# Patient Record
Sex: Female | Born: 1970
Health system: Southern US, Community
[De-identification: ages and names within clinical notes are randomized; demographics above are authoritative.]

## PROBLEM LIST (undated history)

## (undated) DIAGNOSIS — R1110 Vomiting, unspecified: Secondary | ICD-10-CM

## (undated) DIAGNOSIS — H269 Unspecified cataract: Secondary | ICD-10-CM

## (undated) DIAGNOSIS — R011 Cardiac murmur, unspecified: Secondary | ICD-10-CM

## (undated) DIAGNOSIS — T7840XA Allergy, unspecified, initial encounter: Secondary | ICD-10-CM

## (undated) DIAGNOSIS — I509 Heart failure, unspecified: Secondary | ICD-10-CM

## (undated) DIAGNOSIS — F419 Anxiety disorder, unspecified: Secondary | ICD-10-CM

## (undated) DIAGNOSIS — E039 Hypothyroidism, unspecified: Secondary | ICD-10-CM

## (undated) DIAGNOSIS — R197 Diarrhea, unspecified: Secondary | ICD-10-CM

## (undated) DIAGNOSIS — F32A Depression, unspecified: Secondary | ICD-10-CM

## (undated) DIAGNOSIS — E7800 Pure hypercholesterolemia, unspecified: Secondary | ICD-10-CM

## (undated) DIAGNOSIS — R7989 Other specified abnormal findings of blood chemistry: Secondary | ICD-10-CM

## (undated) DIAGNOSIS — E1110 Type 2 diabetes mellitus with ketoacidosis without coma: Secondary | ICD-10-CM

## (undated) DIAGNOSIS — R0600 Dyspnea, unspecified: Secondary | ICD-10-CM

## (undated) DIAGNOSIS — D649 Anemia, unspecified: Secondary | ICD-10-CM

## (undated) DIAGNOSIS — K219 Gastro-esophageal reflux disease without esophagitis: Secondary | ICD-10-CM

## (undated) DIAGNOSIS — F329 Major depressive disorder, single episode, unspecified: Secondary | ICD-10-CM

## (undated) DIAGNOSIS — F1910 Other psychoactive substance abuse, uncomplicated: Secondary | ICD-10-CM

## (undated) DIAGNOSIS — I255 Ischemic cardiomyopathy: Secondary | ICD-10-CM

## (undated) DIAGNOSIS — I2510 Atherosclerotic heart disease of native coronary artery without angina pectoris: Secondary | ICD-10-CM

## (undated) DIAGNOSIS — J45909 Unspecified asthma, uncomplicated: Secondary | ICD-10-CM

## (undated) HISTORY — DX: Other specified abnormal findings of blood chemistry: R79.89

## (undated) HISTORY — PX: COLONOSCOPY: SHX174

## (undated) HISTORY — DX: Other psychoactive substance abuse, uncomplicated: F19.10

## (undated) HISTORY — DX: Ischemic cardiomyopathy: I25.5

## (undated) HISTORY — DX: Cardiac murmur, unspecified: R01.1

## (undated) HISTORY — DX: Allergy, unspecified, initial encounter: T78.40XA

## (undated) HISTORY — DX: Anxiety disorder, unspecified: F41.9

## (undated) HISTORY — DX: Anemia, unspecified: D64.9

## (undated) HISTORY — DX: Unspecified asthma, uncomplicated: J45.909

## (undated) HISTORY — DX: Unspecified cataract: H26.9

## (undated) HISTORY — DX: Heart failure, unspecified: I50.9

## (undated) HISTORY — PX: APPENDECTOMY: SHX54

---

## 1998-01-16 ENCOUNTER — Emergency Department (HOSPITAL_COMMUNITY): Admission: EM | Admit: 1998-01-16 | Discharge: 1998-01-16 | Payer: Self-pay | Admitting: Emergency Medicine

## 2010-12-04 ENCOUNTER — Encounter: Payer: Self-pay | Admitting: *Deleted

## 2010-12-04 ENCOUNTER — Observation Stay (HOSPITAL_COMMUNITY)
Admission: EM | Admit: 2010-12-04 | Discharge: 2010-12-05 | Disposition: A | Discharge status: Home / Self Care | Payer: PRIVATE HEALTH INSURANCE | Attending: Internal Medicine | Admitting: Internal Medicine

## 2010-12-04 DIAGNOSIS — F3289 Other specified depressive episodes: Secondary | ICD-10-CM | POA: Insufficient documentation

## 2010-12-04 DIAGNOSIS — F329 Major depressive disorder, single episode, unspecified: Secondary | ICD-10-CM | POA: Insufficient documentation

## 2010-12-04 DIAGNOSIS — K2920 Alcoholic gastritis without bleeding: Principal | ICD-10-CM | POA: Diagnosis present

## 2010-12-04 DIAGNOSIS — Z794 Long term (current) use of insulin: Secondary | ICD-10-CM | POA: Insufficient documentation

## 2010-12-04 DIAGNOSIS — E785 Hyperlipidemia, unspecified: Secondary | ICD-10-CM | POA: Insufficient documentation

## 2010-12-04 DIAGNOSIS — R112 Nausea with vomiting, unspecified: Secondary | ICD-10-CM | POA: Diagnosis present

## 2010-12-04 DIAGNOSIS — F32A Depression, unspecified: Secondary | ICD-10-CM | POA: Diagnosis present

## 2010-12-04 DIAGNOSIS — E860 Dehydration: Secondary | ICD-10-CM | POA: Diagnosis present

## 2010-12-04 DIAGNOSIS — E039 Hypothyroidism, unspecified: Secondary | ICD-10-CM | POA: Diagnosis present

## 2010-12-04 DIAGNOSIS — R197 Diarrhea, unspecified: Secondary | ICD-10-CM | POA: Insufficient documentation

## 2010-12-04 DIAGNOSIS — E119 Type 2 diabetes mellitus without complications: Secondary | ICD-10-CM | POA: Insufficient documentation

## 2010-12-04 HISTORY — DX: Major depressive disorder, single episode, unspecified: F32.9

## 2010-12-04 HISTORY — DX: Pure hypercholesterolemia, unspecified: E78.00

## 2010-12-04 HISTORY — DX: Nausea with vomiting, unspecified: R11.2

## 2010-12-04 HISTORY — DX: Depression, unspecified: F32.A

## 2010-12-04 LAB — URINALYSIS, ROUTINE W REFLEX MICROSCOPIC
Bilirubin Urine: NEGATIVE
Protein, ur: NEGATIVE mg/dL
Specific Gravity, Urine: 1.015 (ref 1.005–1.030)
Urobilinogen, UA: 0.2 mg/dL (ref 0.0–1.0)

## 2010-12-04 LAB — POCT I-STAT 3, VENOUS BLOOD GAS (G3P V)
Bicarbonate: 18.4 mEq/L — ABNORMAL LOW (ref 20.0–24.0)
O2 Saturation: 86 %
TCO2: 19 mmol/L (ref 0–100)
pCO2, Ven: 27.9 mmHg — ABNORMAL LOW (ref 45.0–50.0)

## 2010-12-04 LAB — COMPREHENSIVE METABOLIC PANEL
ALT: 23 U/L (ref 0–35)
Alkaline Phosphatase: 70 U/L (ref 39–117)
Chloride: 94 mEq/L — ABNORMAL LOW (ref 96–112)
GFR calc Af Amer: >90 mL/min (ref >90)
GFR calc non Af Amer: >90 mL/min (ref >90)

## 2010-12-04 LAB — URINE MICROSCOPIC-ADD ON

## 2010-12-04 LAB — CBC
HCT: 34.5 % — ABNORMAL LOW (ref 36.0–46.0)
Hemoglobin: 12.7 g/dL (ref 12.0–15.0)
Hemoglobin: 12.7 g/dL (ref 12.0–15.0)
MCH: 31.3 pg (ref 26.0–34.0)
MCH: 31.4 pg (ref 26.0–34.0)
MCHC: 36.8 g/dL — ABNORMAL HIGH (ref 30.0–36.0)
MCV: 85 fL (ref 78.0–100.0)
MCV: 86.9 fL (ref 78.0–100.0)
Platelets: 364 10*3/uL (ref 150–400)
RBC: 4.05 MIL/uL (ref 3.87–5.11)
RDW: 11.8 % (ref 11.5–15.5)
WBC: 10.4 10*3/uL (ref 4.0–10.5)

## 2010-12-04 LAB — CREATININE, SERUM
Creatinine, Ser: 0.59 mg/dL (ref 0.50–1.10)
GFR calc Af Amer: >90 mL/min (ref >90)

## 2010-12-04 LAB — DIFFERENTIAL
Basophils Absolute: 0 10*3/uL (ref 0.0–0.1)
Basophils Relative: 0 % (ref 0–1)
Eosinophils Absolute: 0 10*3/uL (ref 0.0–0.7)
Eosinophils Relative: 0 % (ref 0–5)
Monocytes Absolute: 0.3 10*3/uL (ref 0.1–1.0)
Monocytes Relative: 2 % — ABNORMAL LOW (ref 3–12)
Neutro Abs: 12.8 10*3/uL — ABNORMAL HIGH (ref 1.7–7.7)

## 2010-12-04 LAB — GLUCOSE, CAPILLARY: Glucose-Capillary: 235 mg/dL — ABNORMAL HIGH (ref 70–99)

## 2010-12-04 MED ORDER — BUPROPION HCL ER (XL) 300 MG PO TB24
300.0000 mg | ORAL_TABLET | ORAL | Status: DC
  Filled 2010-12-04 (×2): qty 1

## 2010-12-04 MED ORDER — SODIUM CHLORIDE 0.9 % IV SOLN
999.0000 mL | Freq: Once | INTRAVENOUS | Status: AC
  Administered 2010-12-04: 999 mL via INTRAVENOUS

## 2010-12-04 MED ORDER — INSULIN ASPART 100 UNIT/ML ~~LOC~~ SOLN
0.0000 [IU] | Freq: Three times a day (TID) | SUBCUTANEOUS | Status: DC
  Administered 2010-12-04 – 2010-12-05 (×2): 3 [IU] via SUBCUTANEOUS
  Filled 2010-12-04: qty 3

## 2010-12-04 MED ORDER — LORAZEPAM 2 MG/ML IJ SOLN
1.0000 mg | Freq: Once | INTRAMUSCULAR | Status: AC
  Administered 2010-12-04: 1 mg via INTRAVENOUS

## 2010-12-04 MED ORDER — ONDANSETRON HCL 4 MG PO TABS: 4.0000 mg | ORAL_TABLET | Freq: Four times a day (QID) | ORAL | Status: DC | PRN

## 2010-12-04 MED ORDER — LORAZEPAM 2 MG/ML IJ SOLN: 1.0000 mg | INTRAMUSCULAR | Status: DC | PRN

## 2010-12-04 MED ORDER — PROMETHAZINE HCL 25 MG/ML IJ SOLN
25.0000 mg | Freq: Once | INTRAMUSCULAR | Status: AC
  Administered 2010-12-04: 25 mg via INTRAVENOUS
  Filled 2010-12-04 (×2): qty 1

## 2010-12-04 MED ORDER — THIAMINE HCL 100 MG/ML IJ SOLN
Freq: Once | INTRAVENOUS | Status: AC
  Administered 2010-12-04: 18:00:00 via INTRAVENOUS
  Filled 2010-12-04: qty 1000

## 2010-12-04 MED ORDER — PANTOPRAZOLE SODIUM 40 MG IV SOLR
40.0000 mg | INTRAVENOUS | Status: DC
  Administered 2010-12-04: 40 mg via INTRAVENOUS
  Filled 2010-12-04 (×2): qty 40

## 2010-12-04 MED ORDER — SODIUM CHLORIDE 0.9 % IV SOLN
INTRAVENOUS | Status: AC
  Administered 2010-12-04: 125 mL/h via INTRAVENOUS

## 2010-12-04 MED ORDER — LORAZEPAM 2 MG/ML IJ SOLN
1.0000 mg | Freq: Once | INTRAMUSCULAR | Status: AC
  Administered 2010-12-04: 1 mg via INTRAVENOUS
  Filled 2010-12-04: qty 1

## 2010-12-04 MED ORDER — PROMETHAZINE HCL 25 MG RE SUPP: 12.5000 mg | Freq: Four times a day (QID) | RECTAL | Status: DC | PRN

## 2010-12-04 MED ORDER — INSULIN GLARGINE 100 UNIT/ML ~~LOC~~ SOLN
15.0000 [IU] | Freq: Every day | SUBCUTANEOUS | Status: DC
  Administered 2010-12-04: 15 [IU] via SUBCUTANEOUS
  Filled 2010-12-04: qty 3

## 2010-12-04 MED ORDER — LEVOTHYROXINE SODIUM 112 MCG PO TABS
112.0000 ug | ORAL_TABLET | Freq: Every day | ORAL | Status: DC
  Administered 2010-12-05: 112 ug via ORAL
  Filled 2010-12-04 (×2): qty 1

## 2010-12-04 MED ORDER — ONDANSETRON HCL 4 MG/2ML IJ SOLN
INTRAMUSCULAR | Status: AC
  Administered 2010-12-04: 4 mg
  Filled 2010-12-04: qty 2

## 2010-12-04 MED ORDER — FLUOXETINE HCL 10 MG PO CAPS
10.0000 mg | ORAL_CAPSULE | Freq: Every day | ORAL | Status: DC
  Administered 2010-12-05: 10 mg via ORAL
  Filled 2010-12-04: qty 1

## 2010-12-04 MED ORDER — ONDANSETRON HCL 4 MG/2ML IJ SOLN: 4.0000 mg | Freq: Four times a day (QID) | INTRAMUSCULAR | Status: DC | PRN

## 2010-12-04 MED ORDER — SIMVASTATIN 20 MG PO TABS
20.0000 mg | ORAL_TABLET | Freq: Every day | ORAL | Status: DC
  Filled 2010-12-04 (×2): qty 1

## 2010-12-04 MED ORDER — ONDANSETRON HCL 4 MG/2ML IJ SOLN
INTRAMUSCULAR | Status: AC
  Administered 2010-12-04: 09:00:00
  Filled 2010-12-04: qty 4

## 2010-12-04 MED ORDER — SODIUM CHLORIDE 0.9 % IV BOLUS (SEPSIS)
1000.0000 mL | Freq: Once | INTRAVENOUS | Status: AC
  Administered 2010-12-04: 1000 mL via INTRAVENOUS

## 2010-12-04 MED ORDER — ENOXAPARIN SODIUM 40 MG/0.4ML ~~LOC~~ SOLN
40.0000 mg | SUBCUTANEOUS | Status: DC
  Administered 2010-12-04: 40 mg via SUBCUTANEOUS
  Filled 2010-12-04 (×2): qty 0.4

## 2010-12-04 NOTE — ED Notes (Signed)
Pt having n/v/d since 0200, took 12.5 phenergan at home, received 8mg  zofran by ems.

## 2010-12-04 NOTE — ED Provider Notes (Signed)
History     CSN: [id number] Arrival date & time: 12/04/2010  8:54 AM   First MD Initiated Contact with Patient 12/04/10 [phone]      Chief Complaint  Patient presents with  . Emesis  . Diarrhea    (Consider location/radiation/quality/duration/timing/severity/associated sxs/prior treatment) HPI Patient with insulin-dependent diabetes now presents with nausea, vomiting, diarrhea. She notes the relatively sudden onset about 4 hours prior to presentation. On initial presentation the patient is nauseous, but capable of providing history. She notes that since onset she has achieved minimal relief with Phenergan, and her symptoms seem to be worsening on her own. She denies significant abdominal pain, but does note that it is worse with emesis. No fever, no chills, no chest pain, no dyspnea, no confusion, no disorientation, no ataxia Past Medical History  Diagnosis Date  . High cholesterol   . Diabetes mellitus     History reviewed. No pertinent past surgical history.  History reviewed. No pertinent family history.  History  Substance Use Topics  . Smoking status: Never Smoker   . Smokeless tobacco: Not on file  . Alcohol Use: Yes     had 4 beers last night    OB History    Grav Para Term Preterm Abortions TAB SAB Ect Mult Living                  Review of Systems  All other systems reviewed and are negative.    Allergies  Penicillins  Home Medications   Current Outpatient Rx  Name Route Sig Dispense Refill  . ONDANSETRON HCL 4 MG/2ML IJ SOLN Intravenous Inject 8 mg into the vein once.      Marland Kitchen PROMETHAZINE HCL 12.5 MG RE SUPP Rectal Place 12.5 mg rectally every 6 (six) hours as needed. For nausea and vomiting.       BP 102/76  Pulse 103  Temp(Src) 98.4 F (36.9 C) (Oral)  Resp 24  SpO2 100%  LMP 11/27/2010  Physical Exam  Constitutional: She is oriented to person, place, and time. She appears well-developed and well-nourished.  HENT:  Head: Normocephalic and  atraumatic.  Eyes: Conjunctivae are normal.  Cardiovascular: Tachycardia present.   Pulmonary/Chest: Effort normal and breath sounds normal. No stridor. No respiratory distress.  Abdominal: Soft. Normal appearance. There is generalized tenderness. There is no rigidity, no rebound, no guarding, no tenderness at McBurney's point and negative Murphy's sign.  Musculoskeletal: She exhibits no edema and no tenderness.  Neurological: She is alert and oriented to person, place, and time. No cranial nerve deficit.  Skin: Skin is warm. She is diaphoretic.  Psychiatric: She has a normal mood and affect.    ED Course  Procedures (including critical care time)  Labs Reviewed  CBC - Abnormal; Notable for the following:    WBC 13.9 (*)    HCT 34.5 (*)    MCHC 36.8 (*)    Platelets 411 (*)    All other components within normal limits  DIFFERENTIAL - Abnormal; Notable for the following:    Neutrophils Relative 92 (*)    Neutro Abs 12.8 (*)    Lymphocytes Relative 6 (*)    Monocytes Relative 2 (*)    All other components within normal limits  COMPREHENSIVE METABOLIC PANEL - Abnormal; Notable for the following:    Sodium 131 (*)    Chloride 94 (*)    CO2 17 (*)    Glucose, Bld 268 (*)    All other components within normal limits  URINALYSIS, ROUTINE W REFLEX MICROSCOPIC - Abnormal; Notable for the following:    Glucose, UA >1000 (*)    Hgb urine dipstick TRACE (*)    Ketones, ur >80 (*)    Leukocytes, UA TRACE (*)    All other components within normal limits  POCT I-STAT 3, BLOOD GAS (G3P V) - Abnormal; Notable for the following:    pH, Ven 7.427 (*)    pCO2, Ven 27.9 (*)    pO2, Ven 50.0 (*)    Bicarbonate 18.4 (*)    Acid-base deficit 4.0 (*)    All other components within normal limits  GLUCOSE, CAPILLARY - Abnormal; Notable for the following:    Glucose-Capillary 237 (*)    All other components within normal limits  LIPASE, BLOOD  URINE MICROSCOPIC-ADD ON  POCT PREGNANCY, URINE    POCT PREGNANCY, URINE   No results found.   No diagnosis found.    MDM  This 40 year old female now presents with several hours of nausea, vomiting, diarrhea. On initial exam the patient is uncomfortable appearing but not in distress. The patient's initial blood glucose was 300. Following IV fluids, multiple rounds of antibiotics, the patient was minimally improved, though the frequency of emesis decreased notably. Labs notable for suggestions of dehydration, though no frank acidosis. Given the patient's requirement of continued antiemetics and IV fluids, she'll be admitted to the hospitalist service for further evaluation and management of her dehydration, and persistent nausea.        Gerhard Munch, MD 12/04/10 1359

## 2010-12-04 NOTE — ED Notes (Signed)
Pt is sleepy and continues to have some nausea.  No vomiting at this time.  Will continue to monitor.  Familiy member is at the bedside.

## 2010-12-04 NOTE — H&P (Signed)
PCP:  Almedia Balls with [facility]  Chief Complaint:  Nausea and vomiting  HPI: Jennifer Hogan is a 40 year old white female was in her usual state of health until last night apparently went out to eat with a friend had 4 beers and some fries subsequently went to his house. And then started complaining of nausea and vomiting, reports to 3 episodes of vomiting, denies any blood in the vomitus. Also reports 2 episodes of diarrhea last night none this morning, subsequently came to the ER workup here was pretty unremarkable except for slightly elevated BUN and metabolic alkalosis due to vomiting. In the ER she was also given Ativan 1 mg x2 because she was apparently anxious and also received Phenergan. At this time she is rather lethargic and hence history is limited due to this. She is a diabetic on insulin and reports compliance with her medications, denies fevers and chills.  Allergies:   Allergies  Allergen Reactions  . Penicillins Hives      Past Medical History  Diagnosis Date  1. High cholesterol   2 Diabetes mellitus   3 Depression    4. Hypothyroidism  5. dislipidemia   Prior to Admission medications   Medication Sig Start Date End Date Taking? Authorizing Provider  atorvastatin (LIPITOR) 10 MG tablet Take 10 mg by mouth daily.     Yes Historical Provider, MD  buPROPion (WELLBUTRIN XL) 300 MG 24 hr tablet Take 300 mg by mouth every morning.     Yes Historical Provider, MD  FLUoxetine (PROZAC) 10 MG tablet Take 10-20 mg by mouth daily.     Yes Historical Provider, MD  insulin glargine (LANTUS) 100 UNIT/ML injection Inject 22 Units into the skin at bedtime.     Yes Historical Provider, MD  levothyroxine (SYNTHROID, LEVOTHROID) 112 MCG tablet Take 112 mcg by mouth daily before breakfast.     Yes Historical Provider, MD  ondansetron (ZOFRAN) 4 MG/2ML SOLN Inject 8 mg into the vein once.     Yes Historical Provider, MD  promethazine (PHENERGAN) 12.5 MG suppository Place 12.5 mg  rectally every 6 (six) hours as needed. For nausea and vomiting.    Yes Historical Provider, MD    Social History:  reports that she has never smoked. She does not have any smokeless tobacco history on file. She reports that she drinks alcohol. She reports that she does not use illicit drugs. Drinks 4 beers last night, denies drinking alcohol on a Regular basis. Denies drug use   Family History  Problem Relation Age of Onset  . Coronary artery disease Father     Review of Systems:  Constitutional: Denies fever, chills, diaphoresis, appetite change and fatigue.  HEENT: Denies photophobia, eye pain, redness, hearing loss, ear pain, congestion, sore throat, rhinorrhea, sneezing, mouth sores, trouble swallowing, neck pain, neck stiffness and tinnitus.   Respiratory: Denies SOB, DOE, cough, chest tightness,  and wheezing.   Cardiovascular: Denies chest pain, palpitations and leg swelling.  Genitourinary: Denies dysuria, urgency, frequency, hematuria, flank pain and difficulty urinating.  Musculoskeletal: Denies myalgias, back pain, joint swelling, arthralgias and gait problem.  Skin: Denies pallor, rash and wound.  Neurological: Denies dizziness, seizures, syncope, weakness, light-headedness, numbness and headaches.  Hematological: Denies adenopathy. Easy bruising, personal or family bleeding history  Psychiatric/Behavioral: Denies suicidal ideation, mood changes, confusion, nervousness, sleep disturbance and agitation   Physical Exam: Blood pressure 102/76, pulse 103, temperature 98.4 F (36.9 C), temperature source Oral, resp. rate 24, last menstrual period 11/27/2010, SpO2 100.00%. Gen.  lethargic easily aroused, HEENT pupils 4 mm reactive to light, oral mucosa is moist and pink neck no JVD or lymphadenopathy. CVS: S2 regular rate rhythm no murmurs rubs or gallops Lungs: Clear to auscultation bilaterally Abdomen: Nontender no organomegaly no flank tenderness Extremities: No edema  clubbing or cyanosis  Labs on Admission:  Results for orders placed during the hospital encounter of 12/04/10 (from the past 48 hour(s))  CBC     Status: Abnormal   Collection Time   12/04/10  9:02 AM      Component Value Range Comment   WBC 13.9 (*) 4.0 - 10.5 (K/uL)    RBC 4.06  3.87 - 5.11 (MIL/uL)    Hemoglobin 12.7  12.0 - 15.0 (g/dL)    HCT 16.1 (*) 09.6 - 46.0 (%)    MCV 85.0  78.0 - 100.0 (fL)    MCH 31.3  26.0 - 34.0 (pg)    MCHC 36.8 (*) 30.0 - 36.0 (g/dL)    RDW 04.5  40.9 - 81.1 (%)    Platelets 411 (*) 150 - 400 (K/uL)   DIFFERENTIAL     Status: Abnormal   Collection Time   12/04/10  9:02 AM      Component Value Range Comment   Neutrophils Relative 92 (*) 43 - 77 (%)    Neutro Abs 12.8 (*) 1.7 - 7.7 (K/uL)    Lymphocytes Relative 6 (*) 12 - 46 (%)    Lymphs Abs 0.8  0.7 - 4.0 (K/uL)    Monocytes Relative 2 (*) 3 - 12 (%)    Monocytes Absolute 0.3  0.1 - 1.0 (K/uL)    Eosinophils Relative 0  0 - 5 (%)    Eosinophils Absolute 0.0  0.0 - 0.7 (K/uL)    Basophils Relative 0  0 - 1 (%)    Basophils Absolute 0.0  0.0 - 0.1 (K/uL)   COMPREHENSIVE METABOLIC PANEL     Status: Abnormal   Collection Time   12/04/10  9:02 AM      Component Value Range Comment   Sodium 131 (*) 135 - 145 (mEq/L)    Potassium 4.0  3.5 - 5.1 (mEq/L)    Chloride 94 (*) 96 - 112 (mEq/L)    CO2 17 (*) 19 - 32 (mEq/L)    Glucose, Bld 268 (*) 70 - 99 (mg/dL)    BUN 14  6 - 23 (mg/dL)    Creatinine, Ser 9.14  0.50 - 1.10 (mg/dL)    Calcium 9.7  8.4 - 10.5 (mg/dL)    Total Protein 8.0  6.0 - 8.3 (g/dL)    Albumin 4.3  3.5 - 5.2 (g/dL)    AST 30  0 - 37 (U/L)    ALT 23  0 - 35 (U/L)    Alkaline Phosphatase 70  39 - 117 (U/L)    Total Bilirubin 0.6  0.3 - 1.2 (mg/dL)    GFR calc non Af Amer >90  >90 (mL/min)    GFR calc Af Amer >90  >90 (mL/min)   LIPASE, BLOOD     Status: Normal   Collection Time   12/04/10  9:02 AM      Component Value Range Comment   Lipase 12  11 - 59 (U/L)     URINALYSIS, ROUTINE W REFLEX MICROSCOPIC     Status: Abnormal   Collection Time   12/04/10 10:26 AM      Component Value Range Comment   Color, Urine YELLOW  YELLOW  Appearance CLEAR  CLEAR     Specific Gravity, Urine 1.015  1.005 - 1.030     pH 7.5  5.0 - 8.0     Glucose, UA >1000 (*) NEGATIVE (mg/dL)    Hgb urine dipstick TRACE (*) NEGATIVE     Bilirubin Urine NEGATIVE  NEGATIVE     Ketones, ur >80 (*) NEGATIVE (mg/dL)    Protein, ur NEGATIVE  NEGATIVE (mg/dL)    Urobilinogen, UA 0.2  0.0 - 1.0 (mg/dL)    Nitrite NEGATIVE  NEGATIVE     Leukocytes, UA TRACE (*) NEGATIVE    URINE MICROSCOPIC-ADD ON     Status: Normal   Collection Time   12/04/10 10:26 AM      Component Value Range Comment   WBC, UA 0-2  <3 (WBC/hpf)    RBC / HPF 0-2  <3 (RBC/hpf)   POCT PREGNANCY, URINE     Status: Normal   Collection Time   12/04/10 10:31 AM      Component Value Range Comment   Preg Test, Ur NEGATIVE     POCT I-STAT 3, BLOOD GAS (G3P V)     Status: Abnormal   Collection Time   12/04/10 10:46 AM      Component Value Range Comment   pH, Ven 7.427 (*) 7.250 - 7.300     pCO2, Ven 27.9 (*) 45.0 - 50.0 (mmHg)    pO2, Ven 50.0 (*) 30.0 - 45.0 (mmHg)    Bicarbonate 18.4 (*) 20.0 - 24.0 (mEq/L)    TCO2 19  0 - 100 (mmol/L)    O2 Saturation 86.0      Acid-base deficit 4.0 (*) 0.0 - 2.0 (mmol/L)    Sample type VENOUS     GLUCOSE, CAPILLARY     Status: Abnormal   Collection Time   12/04/10 12:27 PM      Component Value Range Comment   Glucose-Capillary 237 (*) 70 - 99 (mg/dL)     Radiological Exams on Admission: No results found.  Assessment/Plan  #1.nausea and vomiting: Secondary to alcohol induced gastritis versus gastroenteritis, will admit for 23 hour observation, treat symptomatically with fluids, antiemetics, PPI I will also check a urine tox screen #2. Diabetes: Continue Lantus and sliding-scale #3: Depression: Continue bupropion  Time Spent on  Admission:61min Keyon Liller 12/04/2010, 2:54 PM

## 2010-12-05 ENCOUNTER — Emergency Department (HOSPITAL_COMMUNITY): Payer: PRIVATE HEALTH INSURANCE

## 2010-12-05 ENCOUNTER — Emergency Department (HOSPITAL_COMMUNITY)
Admission: EM | Admit: 2010-12-05 | Discharge: 2010-12-06 | Disposition: A | Discharge status: Home / Self Care | Payer: PRIVATE HEALTH INSURANCE | Attending: Emergency Medicine | Admitting: Emergency Medicine

## 2010-12-05 ENCOUNTER — Encounter (HOSPITAL_COMMUNITY): Payer: Self-pay | Admitting: Emergency Medicine

## 2010-12-05 DIAGNOSIS — E860 Dehydration: Secondary | ICD-10-CM | POA: Diagnosis present

## 2010-12-05 DIAGNOSIS — R1110 Vomiting, unspecified: Secondary | ICD-10-CM

## 2010-12-05 DIAGNOSIS — E1169 Type 2 diabetes mellitus with other specified complication: Secondary | ICD-10-CM | POA: Insufficient documentation

## 2010-12-05 DIAGNOSIS — R739 Hyperglycemia, unspecified: Secondary | ICD-10-CM

## 2010-12-05 DIAGNOSIS — R109 Unspecified abdominal pain: Secondary | ICD-10-CM | POA: Insufficient documentation

## 2010-12-05 DIAGNOSIS — E039 Hypothyroidism, unspecified: Secondary | ICD-10-CM | POA: Insufficient documentation

## 2010-12-05 DIAGNOSIS — E7800 Pure hypercholesterolemia, unspecified: Secondary | ICD-10-CM | POA: Insufficient documentation

## 2010-12-05 DIAGNOSIS — R7989 Other specified abnormal findings of blood chemistry: Secondary | ICD-10-CM

## 2010-12-05 DIAGNOSIS — R112 Nausea with vomiting, unspecified: Secondary | ICD-10-CM | POA: Insufficient documentation

## 2010-12-05 DIAGNOSIS — Z794 Long term (current) use of insulin: Secondary | ICD-10-CM | POA: Insufficient documentation

## 2010-12-05 DIAGNOSIS — K2920 Alcoholic gastritis without bleeding: Secondary | ICD-10-CM | POA: Diagnosis present

## 2010-12-05 DIAGNOSIS — N390 Urinary tract infection, site not specified: Secondary | ICD-10-CM

## 2010-12-05 HISTORY — DX: Hypothyroidism, unspecified: E03.9

## 2010-12-05 LAB — COMPREHENSIVE METABOLIC PANEL
ALT: 22 U/L (ref 0–35)
Alkaline Phosphatase: 72 U/L (ref 39–117)
BUN: 16 mg/dL (ref 6–23)
CO2: 16 mEq/L — ABNORMAL LOW (ref 19–32)
Chloride: 98 mEq/L (ref 96–112)
GFR calc Af Amer: >90 mL/min (ref >90)
GFR calc non Af Amer: >90 mL/min (ref >90)
Glucose, Bld: 284 mg/dL — ABNORMAL HIGH (ref 70–99)
Potassium: 4.1 mEq/L (ref 3.5–5.1)
Total Bilirubin: 0.6 mg/dL (ref 0.3–1.2)
Total Protein: 8.2 g/dL (ref 6.0–8.3)

## 2010-12-05 LAB — CBC
Hemoglobin: 13.9 g/dL (ref 12.0–15.0)
MCH: 31.6 pg (ref 26.0–34.0)
RBC: 4.4 MIL/uL (ref 3.87–5.11)
WBC: 12.2 10*3/uL — ABNORMAL HIGH (ref 4.0–10.5)

## 2010-12-05 LAB — DIFFERENTIAL
Eosinophils Absolute: 0 10*3/uL (ref 0.0–0.7)
Lymphocytes Relative: 5 % — ABNORMAL LOW (ref 12–46)
Lymphs Abs: 0.6 10*3/uL — ABNORMAL LOW (ref 0.7–4.0)
Monocytes Relative: 1 % — ABNORMAL LOW (ref 3–12)
Neutrophils Relative %: 94 % — ABNORMAL HIGH (ref 43–77)

## 2010-12-05 LAB — URINALYSIS, ROUTINE W REFLEX MICROSCOPIC
Bilirubin Urine: NEGATIVE
Ketones, ur: >80 mg/dL — AB
Nitrite: NEGATIVE
Protein, ur: NEGATIVE mg/dL
Urobilinogen, UA: 0.2 mg/dL (ref 0.0–1.0)

## 2010-12-05 LAB — GLUCOSE, CAPILLARY: Glucose-Capillary: 220 mg/dL — ABNORMAL HIGH (ref 70–99)

## 2010-12-05 MED ORDER — KETOROLAC TROMETHAMINE 60 MG/2ML IM SOLN
60.0000 mg | Freq: Once | INTRAMUSCULAR | Status: AC
  Administered 2010-12-05: 60 mg via INTRAMUSCULAR
  Filled 2010-12-05: qty 2

## 2010-12-05 MED ORDER — PROMETHAZINE HCL 25 MG/ML IJ SOLN
25.0000 mg | Freq: Once | INTRAMUSCULAR | Status: DC
  Filled 2010-12-05: qty 1

## 2010-12-05 MED ORDER — INSULIN GLARGINE 100 UNIT/ML ~~LOC~~ SOLN
10.0000 [IU] | SUBCUTANEOUS | Status: DC
  Administered 2010-12-05: 10 [IU] via SUBCUTANEOUS

## 2010-12-05 MED ORDER — PANTOPRAZOLE SODIUM 40 MG PO TBEC: 40.0000 mg | DELAYED_RELEASE_TABLET | Freq: Every day | ORAL | Status: DC

## 2010-12-05 MED ORDER — DEXTROSE 5 % IV SOLN
1.0000 g | Freq: Once | INTRAVENOUS | Status: AC
  Administered 2010-12-06: 1 g via INTRAVENOUS
  Filled 2010-12-05: qty 10

## 2010-12-05 MED ORDER — HYDROMORPHONE HCL PF 1 MG/ML IJ SOLN
1.0000 mg | Freq: Once | INTRAMUSCULAR | Status: AC
  Administered 2010-12-06: 1 mg via INTRAVENOUS
  Filled 2010-12-05: qty 1

## 2010-12-05 MED ORDER — INSULIN GLARGINE 100 UNIT/ML ~~LOC~~ SOLN: SUBCUTANEOUS | Status: DC

## 2010-12-05 MED ORDER — ONDANSETRON 4 MG PO TBDP
4.0000 mg | ORAL_TABLET | Freq: Once | ORAL | Status: AC
  Administered 2010-12-05: 4 mg via ORAL
  Filled 2010-12-05: qty 1

## 2010-12-05 MED ORDER — BUPROPION HCL ER (XL) 300 MG PO TB24
300.0000 mg | ORAL_TABLET | ORAL | Status: DC
  Administered 2010-12-05: 300 mg via ORAL
  Filled 2010-12-05 (×2): qty 1

## 2010-12-05 MED ORDER — SODIUM CHLORIDE 0.9 % IV SOLN
INTRAVENOUS | Status: DC
  Administered 2010-12-06: 01:00:00 via INTRAVENOUS

## 2010-12-05 NOTE — ED Provider Notes (Signed)
History     CSN: [id number] Arrival date & time: 12/05/2010  7:42 PM   First MD Initiated Contact with Patient 12/05/10 2150      Chief Complaint  Patient presents with  . Emesis    (Consider location/radiation/quality/duration/timing/severity/associated sxs/prior treatment) Patient is a 40 y.o. female presenting with vomiting. The history is provided by the patient and medical records.  Emesis  Associated symptoms include abdominal pain. Pertinent negatives include no chills, no cough, no diarrhea, no fever, no headaches and no URI.   the patient is a 40 year old female with insulin-dependent diabetes mellitus since.  She was 40 years old, who presents to the emergency department with nausea, vomiting, and abdominal pain.  She denies diarrhea.  She denies cough, shortness of breath, fevers, chills. She states that she was here yesterday with similar symptoms and she was kept in the hospital overnight.  She went home today.  She has returned with persistent symptoms.  Past Medical History  Diagnosis Date  . High cholesterol   . Diabetes mellitus   . Depression   . Hypothyroidism     Past Surgical History  Procedure Date  . Appendectomy     Family History  Problem Relation Age of Onset  . Coronary artery disease Father     History  Substance Use Topics  . Smoking status: Never Smoker   . Smokeless tobacco: Not on file  . Alcohol Use: Yes     OCCASIONAL    OB History    Grav Para Term Preterm Abortions TAB SAB Ect Mult Living                  Review of Systems  Constitutional: Negative for fever, chills and diaphoresis.  HENT: Negative for congestion and neck pain.   Eyes: Negative for redness.  Respiratory: Negative for cough, chest tightness and shortness of breath.   Gastrointestinal: Positive for nausea, vomiting and abdominal pain. Negative for diarrhea.  Genitourinary: Negative for dysuria.  Musculoskeletal: Negative for back pain.  Skin: Negative for  rash.  Neurological: Negative for light-headedness, numbness and headaches.  Psychiatric/Behavioral: Negative for confusion.    Allergies  Penicillins  Home Medications   Current Outpatient Rx  Name Route Sig Dispense Refill  . ATORVASTATIN CALCIUM 10 MG PO TABS Oral Take 10 mg by mouth daily.      . BUPROPION HCL ER (XL) 300 MG PO TB24 Oral Take 300 mg by mouth every morning.      Marland Kitchen FLUOXETINE HCL 10 MG PO TABS Oral Take 10-20 mg by mouth daily.      . INSULIN GLARGINE 100 UNIT/ML [place] SOLN  10 Units in am and 16 Units at bedtime 10 mL 0  . LEVOTHYROXINE SODIUM 112 MCG PO TABS Oral Take 112 mcg by mouth daily before breakfast.      . PANTOPRAZOLE SODIUM 40 MG PO TBEC Oral Take 1 tablet (40 mg total) by mouth at bedtime. 30 tablet 0    BP 165/89  Pulse 92  Temp(Src) 98.6 F (37 C) (Oral)  Resp 18  SpO2 99%  LMP 11/27/2010  Physical Exam  Constitutional: She is oriented to person, place, and time. She appears well-developed and well-nourished. She appears distressed.       Uncomfortable  HENT:  Head: Normocephalic and atraumatic.  Eyes: Pupils are equal, round, and reactive to light.  Neck: Normal range of motion.  Cardiovascular: Normal rate, regular rhythm and normal heart sounds.   No murmur heard. Pulmonary/Chest:  Effort normal and breath sounds normal. No respiratory distress. She has no wheezes. She has no rales.  Abdominal: Soft. She exhibits no distension and no mass. There is tenderness. There is no rebound and no guarding.  Musculoskeletal: Normal range of motion. She exhibits no edema and no tenderness.  Neurological: She is alert and oriented to person, place, and time. No cranial nerve deficit.  Skin: Skin is warm and dry. No rash noted. No erythema.  Psychiatric: She has a normal mood and affect. Her behavior is normal.    ED Course  Procedures (including critical care time)  40 year old female with a history of insulin-dependent diabetes.  Presents with  nausea, vomiting, and abdominal pain, no fever, no diarrhea.  No urinary tract symptoms.  We'll perform laboratory testing and treat her symptoms and then administer further treatment.  Based upon laboratory test results.  Labs Reviewed  GLUCOSE, CAPILLARY - Abnormal; Notable for the following:    Glucose-Capillary 290 (*)    All other components within normal limits  CBC - Abnormal; Notable for the following:    WBC 12.2 (*)    MCHC 36.1 (*)    All other components within normal limits  DIFFERENTIAL - Abnormal; Notable for the following:    Neutrophils Relative 94 (*)    Neutro Abs 11.4 (*)    Lymphocytes Relative 5 (*)    Lymphs Abs 0.6 (*)    Monocytes Relative 1 (*)    All other components within normal limits  URINALYSIS, ROUTINE W REFLEX MICROSCOPIC - Abnormal; Notable for the following:    Appearance CLOUDY (*)    Glucose, UA >1000 (*)    Hgb urine dipstick LARGE (*)    Ketones, ur >80 (*)    Leukocytes, UA LARGE (*)    All other components within normal limits  URINE MICROSCOPIC-ADD ON - Abnormal; Notable for the following:    Squamous Epithelial / LPF FEW (*)    Bacteria, UA MANY (*)    All other components within normal limits  POCT PREGNANCY, URINE  COMPREHENSIVE METABOLIC PANEL  POCT PREGNANCY, URINE   Dg Abd Acute W/chest  12/05/2010  *RADIOLOGY REPORT*  Clinical Data: 40 year old female with abdominal pain, nausea, vomiting.  ACUTE ABDOMEN SERIES (ABDOMEN 2 VIEW & CHEST 1 VIEW)  Comparison: None.  Findings: Normal lung volumes. Normal cardiac size and mediastinal contours.  Visualized tracheal air column is within normal limits. The lungs are clear.  No pneumothorax or pneumoperitoneum.  Postoperative changes to the right lower quadrant with suture line. Paucity of bowel gas with no dilated loops.  There is gas at the splenic flexure and in the descending colon.  Abdominal and pelvic visceral contours are within normal limits.  Incidental IUD.  Mild scoliosis. No acute  osseous abnormality identified.  IMPRESSION: Nonobstructed bowel gas pattern, no free air.  Negative chest.  Original Report Authenticated By: Harley Hallmark, M.D.     Urinary tract infection, causing, uncontrolled emesis, and abdominal pain.  Will give analgesics, antiemetics, and IV antibiotics.  We'll move to CDU for further treatment and monitoring until symptoms resolve.   MDM  Urinary tract infection Hyperglycemia Abdominal pain Emesis      6:34 AM Pt feeling better, no further vomiting overnight, tolerating POs.  Will ready for d/c home.  Olivia Mackie, MD 12/06/10 [phone]

## 2010-12-05 NOTE — Discharge Summary (Signed)
  Physician Discharge Summary  Patient ID: [name] MRN: [id number] DOB/AGE: [birth year] 40 y.o.  Admit date: 12/04/2010 Discharge date: 12/05/2010  Primary Care Physician: Gentry Fitz   Discharge Diagnoses:   1. Gastritis, alcoholic 2.Diabetes mellitus 3. Depression 4. Dyslipidemia  5. Nausea & vomiting  6. Hypothyroidism  7. Dehydration    Current Discharge Medication List    START taking these medications   Details  pantoprazole (PROTONIX) 40 MG tablet Take 1 tablet (40 mg total) by mouth at bedtime. Qty: 30 tablet, Refills: 0      CONTINUE these medications which have CHANGED   Details  insulin glargine (LANTUS) 100 UNIT/ML injection 10 Units in am and 16 Units at bedtime Qty: 10 mL, Refills: 0      CONTINUE these medications which have NOT CHANGED   Details  atorvastatin (LIPITOR) 10 MG tablet Take 10 mg by mouth daily.      buPROPion (WELLBUTRIN XL) 300 MG 24 hr tablet Take 300 mg by mouth every morning.      FLUoxetine (PROZAC) 10 MG tablet Take 10-20 mg by mouth daily.      levothyroxine (SYNTHROID, LEVOTHROID) 112 MCG tablet Take 112 mcg by mouth daily before breakfast.        STOP taking these medications     ondansetron (ZOFRAN) 4 MG/2ML SOLN      promethazine (PHENERGAN) 12.5 MG suppository        Disposition and Follow-up:  New PCP in 7-10 days  Consults:  None  Brief H and P: This Jennifer Hogan is a 40 year old female who apparently went out a friend last night drank 4-5 beers and subsequently came to the ER due to persistent nausea and vomiting. Hospital Course:  1. Gastritis, alcoholic: This is felt to be the etiology of her nausea and vomiting, she was treated symptomatically with antiemetics, IV PPI. Next morning her symptoms for much improved and she's been discharged home on daily PPI and advised alcohol cessation. 2. Diabetes mellitus: Stable continued on Lantus and NovoLog sliding scale 3. Dehydration: Resolved with  rehydration.  Time spent on Discharge:  Signed: Jamian Andujo 12/05/2010, 11:54 AM

## 2010-12-05 NOTE — Progress Notes (Signed)
Inpatient Diabetes Program Recommendations  AACE/ADA: New Consensus Statement on Inpatient Glycemic Control (2009)  Target Ranges:  Prepandial:   less than 140 mg/dL      Peak postprandial:   less than 180 mg/dL (1-2 hours)      Critically ill patients:  140 - 180 mg/dL   Reason for Visit: Elevated glucose  215 mg/dL  98/11/91  Inpatient Diabetes Program Recommendations HgbA1C: To assess glycemic control  Note: Patient admitted with metabolic acidosis ? Mild DKA.  Patient hydrated.  Will receive a total of 25 units of Lantus today.  Will continue to follow.

## 2010-12-05 NOTE — ED Notes (Signed)
PT. REPORTS PERSISTENT VOMITTING x2 DAYS AND DIARRHEA. CHILLS . NO FEVER . GENERALIZED ABDOMINAL PAIN . DISCHARGED HERE TODAY.

## 2010-12-06 MED ORDER — DICYCLOMINE HCL 20 MG PO TABS: 20.0000 mg | ORAL_TABLET | Freq: Two times a day (BID) | ORAL | Status: DC

## 2010-12-06 MED ORDER — CIPROFLOXACIN HCL 500 MG PO TABS: 500.0000 mg | ORAL_TABLET | Freq: Two times a day (BID) | ORAL | Status: DC

## 2010-12-06 MED ORDER — SODIUM CHLORIDE 0.9 % IV BOLUS (SEPSIS): 1000.0000 mL | Freq: Once | INTRAVENOUS | Status: DC

## 2010-12-06 MED ORDER — CIPROFLOXACIN IN D5W 400 MG/200ML IV SOLN
400.0000 mg | Freq: Once | INTRAVENOUS | Status: AC
  Administered 2010-12-06: 400 mg via INTRAVENOUS
  Filled 2010-12-06: qty 200

## 2010-12-06 MED ORDER — PROMETHAZINE HCL 25 MG PO TABS: 25.0000 mg | ORAL_TABLET | Freq: Four times a day (QID) | ORAL | Status: DC | PRN

## 2010-12-06 MED ORDER — OXYCODONE-ACETAMINOPHEN 5-325 MG PO TABS: 2.0000 | ORAL_TABLET | ORAL | Status: AC | PRN

## 2010-12-06 MED ORDER — SODIUM CHLORIDE 0.9 % IV BOLUS (SEPSIS)
1000.0000 mL | Freq: Once | INTRAVENOUS | Status: AC
  Administered 2010-12-06: 1000 mL via INTRAVENOUS

## 2010-12-06 MED ORDER — SULFAMETHOXAZOLE-TRIMETHOPRIM 800-160 MG PO TABS: 1.0000 | ORAL_TABLET | Freq: Two times a day (BID) | ORAL | Status: AC

## 2010-12-06 MED ORDER — PROMETHAZINE HCL 25 MG RE SUPP: 25.0000 mg | Freq: Four times a day (QID) | RECTAL | Status: DC | PRN

## 2010-12-06 MED ORDER — ONDANSETRON HCL 4 MG PO TABS: 4.0000 mg | ORAL_TABLET | Freq: Four times a day (QID) | ORAL | Status: AC

## 2010-12-06 MED ORDER — PROMETHAZINE HCL 25 MG/ML IJ SOLN
25.0000 mg | Freq: Once | INTRAMUSCULAR | Status: AC
  Administered 2010-12-06: 25 mg via INTRAVENOUS

## 2010-12-06 NOTE — ED Notes (Signed)
Patients father has called and is aware pt is being discharged and needs to come pick her up. Patient continues to lay in bed and not get dressed for discharged.

## 2010-12-06 NOTE — ED Notes (Signed)
Pt states she has not had bm since tues, though she has only really eaten broth since tues.  Hypoactive bs throughout.  States no nausea after phenergan and pain 1/10 after dilaudid.  Antibiotics infusing.  Pt moved to CDU.

## 2010-12-06 NOTE — ED Notes (Signed)
Patient now states her father will not be here before an hour and a half because he needs to shower and get dressed.  Pt states she did not call him earlier because he was sleeping

## 2010-12-06 NOTE — ED Notes (Signed)
Patient tolerating liquids. No complaints at this time

## 2010-12-06 NOTE — ED Notes (Signed)
Patient c/o nausea/vomiting which started Saturday. Called EMS Sunday and admitted to [facility]. Discharged Monday. Readmitted patient for nausea/vomiting.

## 2010-12-06 NOTE — ED Notes (Signed)
Iv atb infused. Patient still has not reached her transportation home. Will give papers and discharge pt to await her ride home

## 2010-12-06 NOTE — ED Notes (Signed)
Patient presents to the emergency room with complaint of emesis. Patient has been unable to keep down by mouth fluids. Patient denies abdominal pain. Patient resting comfortably her. There is signs reviewed. Nursing notes reviewed.   acute abdominal series negative for any instructions. IV fluids given in the department. IV antibiotics, cipro, for UTI given in the department.   Demetrius Charity, [place] 12/06/10 [phone]

## 2010-12-06 NOTE — ED Notes (Signed)
Patient has been tolerating po fluids .  Denies feeling nauseated.

## 2010-12-06 NOTE — ED Notes (Signed)
Patient awakened. atb started as ordered. Pt has spoken to dr Norlene Campbell about discharge plan. Patient is aware she is receiving iv atb and will then be discharged home. Encouraged pt to call for her ride so they can be prepared to take her home in an hour. Patient states "it all depends when my father will come to get me. He was here until 1 am. Encouraged her to call so he can be waking to come pick her up upon discharge. Patient closed her eyes and turned over to go back to sleep.

## 2012-07-09 ENCOUNTER — Ambulatory Visit (INDEPENDENT_AMBULATORY_CARE_PROVIDER_SITE_OTHER): Payer: BC Managed Care – PPO | Admitting: Endocrinology

## 2012-07-09 ENCOUNTER — Encounter: Payer: Self-pay | Admitting: Endocrinology

## 2012-07-09 VITALS — BP 132/78 | HR 76 | Ht 65.0 in | Wt 149.0 lb

## 2012-07-09 DIAGNOSIS — L80 Vitiligo: Secondary | ICD-10-CM

## 2012-07-09 DIAGNOSIS — E109 Type 1 diabetes mellitus without complications: Secondary | ICD-10-CM | POA: Insufficient documentation

## 2012-07-09 DIAGNOSIS — E039 Hypothyroidism, unspecified: Secondary | ICD-10-CM

## 2012-07-09 LAB — HEMOGLOBIN A1C: Hgb A1c MFr Bld: 12.1 % — ABNORMAL HIGH (ref 4.6–6.5)

## 2012-07-09 NOTE — Progress Notes (Signed)
Subjective:    [name], female    DOB: May 20, 1970, 42 y.o.   MRN: [id number]  HPI pt states 30 years h/o dm.  she has mild if any neuropathy of the lower extremities.  she is unaware of any associated chronic complications.  he has been on insulin since dx.  pt says her diet and exercise are "pretty good."  she wants to pursue pump rx.  She takes lantus + ss novolog (avg total of 10 units/day).  She says cbg's are extremely variable.  She has never had severe hypoglycemia or DKA.   Past Medical History  Diagnosis Date  . High cholesterol   . Diabetes mellitus   . Depression   . Hypothyroidism     Past Surgical History  Procedure Laterality Date  . Appendectomy      History   Social History  . Marital Status: Divorced    Spouse Name: N/A    Number of Children: N/A  . Years of Education: N/A   Occupational History  . Not on file.   Social History Main Topics  . Smoking status: Never Smoker   . Smokeless tobacco: Not on file  . Alcohol Use: Yes     Comment: OCCASIONAL  . Drug Use: No  . Sexually Active: Yes    Birth Control/ Protection: IUD   Other Topics Concern  . Not on file   Social History Narrative  . No narrative on file    Current Outpatient Prescriptions on File Prior to Visit  Medication Sig Dispense Refill  . atorvastatin (LIPITOR) 10 MG tablet Take 10 mg by mouth daily.        Marland Kitchen buPROPion (WELLBUTRIN XL) 300 MG 24 hr tablet Take 300 mg by mouth every morning.        Marland Kitchen FLUoxetine (PROZAC) 10 MG tablet Take 10-20 mg by mouth daily.        . insulin glargine (LANTUS) 100 UNIT/ML injection 10 Units in am and 16 Units at bedtime  10 mL  0  . levothyroxine (SYNTHROID, LEVOTHROID) 112 MCG tablet Take 112 mcg by mouth daily before breakfast.       . dicyclomine (BENTYL) 20 MG tablet Take 1 tablet (20 mg total) by mouth 2 (two) times daily.  20 tablet  0  . pantoprazole (PROTONIX) 40 MG tablet Take 1 tablet (40 mg total) by mouth at  bedtime.  30 tablet  0   No current facility-administered medications on file prior to visit.    Allergies  Allergen Reactions  . Penicillins Hives    Family History  Problem Relation Age of Onset  . Coronary artery disease Father     BP 132/78  Pulse 76  Ht 5\' 5"  (1.651 m)  Wt 149 lb (67.586 kg)  BMI 24.79 kg/m2  SpO2 96%  Review of Systems  denies weight loss, blurry vision, headache, chest pain, sob, n/v, urinary frequency, cramps, excessive diaphoresis, rhinorrhea, and easy bruising.  Depression is well-controlled.  She has regular menses.    Objective:   Physical Exam VS: see vs page GEN: no distress HEAD: head: no deformity eyes: no periorbital swelling, no proptosis external nose and ears are normal mouth: no lesion seen NECK: supple, thyroid is not enlarged CHEST WALL: no deformity LUNGS:  Clear to auscultation CV: reg rate and rhythm, no murmur ABD: abdomen is soft, nontender.  no hepatosplenomegaly.  not distended.  no hernia. MUSCULOSKELETAL: muscle bulk and strength are grossly normal.  no obvious joint swelling.  gait is normal and steady EXTEMITIES: no deformity.  no ulcer on the feet.  feet are of normal color and temp.  no edema PULSES: dorsalis pedis intact bilat.  no carotid bruit NEURO:  cn 2-12 grossly intact.   readily moves all 4's.  sensation is intact to touch on the feet SKIN:  Normal texture and temperature.  No rash or suspicious lesion is visible.  vitiligo is present.  NODES:  None palpable at the neck PSYCH: alert, oriented x3.  Does not appear anxious nor depressed.    Lab Results  Component Value Date   HGBA1C 12.1* 07/09/2012   Lab Results  Component Value Date   TSH 0.33* 07/09/2012      Assessment & Plan:  DM: insulin pump regimen is chosen from multiple options, as it best matches his insulin to her changing requirements throughout the day.  The benefits of glycemic control must be weighed against the risks of hypoglycemia.    Hypothyroidism, slightly overreplaced. 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

## 2012-07-09 NOTE — Patient Instructions (Addendum)
good diet and exercise habits significanly improve the control of your diabetes.  please let me know if you wish to be referred to a dietician.  high blood sugar is very risky to your health.  you should see an eye doctor every year.  You are at higher than average risk for pneumonia and hepatitis-B.  You should be vaccinated against both.   controlling your blood pressure and cholesterol drastically reduces the damage diabetes does to your body.  this also applies to quitting smoking.  please discuss these with your doctor.  check your blood sugar 4 times a day.  vary the time of day when you check, between before the 3 meals, and at bedtime.  also check if you have symptoms of your blood sugar being too high or too low.  please keep a record of the readings and bring it to your next appointment here.  please call us sooner if your blood sugar goes below 70, or if you have a lot of readings over 200.    blood tests are being requested for you today.  We'll contact you with results.   Refer to a diabetes education specialist.  you will receive a phone call, about a day and time for an appointment.  In view of your medical condition, you should avoid pregnancy until we have decided it is safe.

## 2012-07-10 LAB — C-PEPTIDE: C-Peptide: <0.1 ng/mL — ABNORMAL LOW (ref 0.80–3.90)

## 2012-07-10 LAB — BASIC METABOLIC PANEL
BUN: 16 mg/dL (ref 6–23)
Calcium: 9.4 mg/dL (ref 8.4–10.5)
GFR: 59.99 mL/min — ABNORMAL LOW (ref >60.00)
Glucose, Bld: 270 mg/dL — ABNORMAL HIGH (ref 70–99)
Sodium: 134 mEq/L — ABNORMAL LOW (ref 135–145)

## 2012-07-10 LAB — LIPID PANEL
Cholesterol: 197 mg/dL (ref 0–200)
LDL Cholesterol: 96 mg/dL (ref 0–99)
Total CHOL/HDL Ratio: 2

## 2012-07-15 ENCOUNTER — Telehealth: Payer: Self-pay | Admitting: *Deleted

## 2012-07-15 ENCOUNTER — Telehealth: Payer: Self-pay

## 2012-07-15 NOTE — Telephone Encounter (Signed)
Dr. Pedro Earls left voicemail concerning this pt they referred regarding one of her medications that is a different dose than what we have listed.  She would like to speak with someone regarding this.

## 2012-07-15 NOTE — Telephone Encounter (Signed)
Called Dr Maryelizabeth Rowan and advised her that Dr [name] changed it to the 137 mcg on the day she was here. (137 mcg - 1 and 1/2 daily). She stated she wanted to be sure that she and Dr [name] were on the same page concerning dosing of pt.

## 2012-07-15 NOTE — Telephone Encounter (Signed)
The day she was here, it was changed to 137

## 2012-07-15 NOTE — Telephone Encounter (Signed)
Dr Maryelizabeth Rowan called from [facility] [phone] and lvm stating that the pt has been on 137 mcg of Synthroid (1 and 1/2 pills daily) with her for the past 6 weeks. She received a copy of pt's visit on 07/09/12 and saw that the pt has been prescribed 112 mcg. She is wants Dr [name] to be aware of what she has had the pt on and clarify what dosage she should be on. Please advise.

## 2012-07-31 ENCOUNTER — Encounter: Payer: BC Managed Care – PPO | Attending: Endocrinology | Admitting: *Deleted

## 2012-07-31 ENCOUNTER — Encounter: Payer: Self-pay | Admitting: *Deleted

## 2012-07-31 VITALS — Ht 65.0 in | Wt 154.4 lb

## 2012-07-31 DIAGNOSIS — Z9641 Presence of insulin pump (external) (internal): Secondary | ICD-10-CM | POA: Insufficient documentation

## 2012-07-31 DIAGNOSIS — E119 Type 2 diabetes mellitus without complications: Secondary | ICD-10-CM | POA: Insufficient documentation

## 2012-07-31 DIAGNOSIS — E109 Type 1 diabetes mellitus without complications: Secondary | ICD-10-CM

## 2012-07-31 DIAGNOSIS — Z713 Dietary counseling and surveillance: Secondary | ICD-10-CM | POA: Insufficient documentation

## 2012-07-31 NOTE — Progress Notes (Signed)
Introduction to Insulin Pump Therapy:  Appt start time: 1530 end time:  1630.  Assessment:  This patient has DM 1 and their primary concerns today: more information about insulin pump.  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 Lives alone so is in charge of her food preparation.  MEDICATIONS: Basal Insulin: 18 at night and 13 in AM units of Lantus at via  syringe Bolus Insulin: sliding units of Novolog at meals  via r syringe Total of insulin doses per day 44 Other diabetes medications: none  This patient is not currently adjusting bolus insulin based BG  This patient is not currently adjusting bolus insulin based on carb intake   Patient states knowledge of Carb Counting is good  Usual physical activity: enjoys her horses every day, adding stairs  Last A1c was 12.1% on  07/09/2012 Patient states they forget to take their insulin injection on average 2-3 times per week Patient states they have had hypoglycemia 6-8 times in the past month Patient states their biggest barrier with diabetes is high BG's  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  Patient states their expectations of pump therapy include: better BG control by being able to customize basal and bolus rates based on her BG patterns Patient expresses understanding that for improved outcomes for their diabetes on an insulin pump they will:  Check BG 4-6 times per day  Change out pump infusion set at least every 3 days  Upload pump information to software on a regular basis so provider can assess patterns and make setting adjustments.      Intervention:    Taught difference between delivery of insulin via syringe/pen compared to insulin  pump.  Demonstrated improved insulin delivery via pump due to improved accuracy of dose and flexibility of adjusting bolus insulin based on carb intake and BG correction.  Demonstrated pump, insulin reservoir and infusion set options, and button pushing for bolus delivery of insulin through the pump  Explained importance of testing BG at least 4 times per day for appropriate correction of high BG and prevention of DKA as applicable.  Emphasized importance of follow up after Pump Start for appropriate pump setting adjustments and on-going training on more advanced features.  Handouts given during visit include:  Intensive insulin handout   Monitoring/Evaluation:    Patient does want to continue with pursuit of insulin pump. She has already completed paperwork and is awaiting the money to pay for it.  Patient instructed to go to Medtronic pump web-site to complete learning module on insulin pump and bring Certificate of Completion to next visit  Patient informed to contact this office to set up training once pump is being shipped.

## 2012-08-08 ENCOUNTER — Telehealth: Payer: Self-pay | Admitting: *Deleted

## 2012-08-08 NOTE — Telephone Encounter (Signed)
Pt called requesting results from lab work and requested cholesterol meds. Please advise.

## 2012-08-08 NOTE — Telephone Encounter (Signed)
Blood sugar is high (12) You do qualify for pump.  You should see a pump trainer.

## 2012-08-09 ENCOUNTER — Telehealth: Payer: Self-pay | Admitting: *Deleted

## 2012-08-09 NOTE — Telephone Encounter (Signed)
Lvm for pt to return call.

## 2012-08-13 NOTE — Telephone Encounter (Signed)
Appointment made

## 2012-08-22 ENCOUNTER — Other Ambulatory Visit: Payer: Self-pay | Admitting: *Deleted

## 2012-08-22 ENCOUNTER — Encounter: Payer: BC Managed Care – PPO | Admitting: *Deleted

## 2012-08-22 DIAGNOSIS — E109 Type 1 diabetes mellitus without complications: Secondary | ICD-10-CM

## 2012-08-22 MED ORDER — GLUCOSE BLOOD VI STRP: ORAL_STRIP | Status: DC

## 2012-08-22 NOTE — Telephone Encounter (Signed)
Rx for test strips Bayer Contour Next meter.

## 2012-08-23 ENCOUNTER — Other Ambulatory Visit: Payer: Self-pay | Admitting: *Deleted

## 2012-08-23 MED ORDER — GLUCOSE BLOOD VI STRP: ORAL_STRIP | Status: DC

## 2012-09-04 ENCOUNTER — Encounter: Payer: BC Managed Care – PPO | Attending: Endocrinology | Admitting: *Deleted

## 2012-09-04 DIAGNOSIS — Z713 Dietary counseling and surveillance: Secondary | ICD-10-CM | POA: Insufficient documentation

## 2012-09-04 DIAGNOSIS — Z9641 Presence of insulin pump (external) (internal): Secondary | ICD-10-CM | POA: Insufficient documentation

## 2012-09-04 DIAGNOSIS — E119 Type 2 diabetes mellitus without complications: Secondary | ICD-10-CM | POA: Insufficient documentation

## 2012-09-04 DIAGNOSIS — E109 Type 1 diabetes mellitus without complications: Secondary | ICD-10-CM

## 2012-09-10 NOTE — Progress Notes (Signed)
Insulin Pump Start Progress Note:  Patient appointment start time: 1500  End time 1700  Patient here for insulin pump start on Medtronic 530G insulin pump and Quick Set infusion set Orders with pump settings received from MD Patient completed Pre- training by training books and return demonstration  Reviewed Pump Set Up including  Menu Settings  Bolus with Carb Ratio of 1 unit / 15 grams Carb, Correction Factor of 1 unit / 50 mg/dl  Suspend  Basal with initial Basal Rate of 0.75 units/hour  Reservoir Set Up  Utilities Pump Training Checklist completed  Patient is signed up for [employer] and agrees to upload by 08/26/2012 for review of progress and allow for pump setting adjustments  Patient successfully completed pump start and instructed to call Bev Butler, RD, CDE if BG drops below 60 mg/dl or goes above 161 mg/dl or as directed by MD  Follow up plan: Follow up appointment within 2 weeks

## 2012-09-21 NOTE — Progress Notes (Signed)
CGM Training:  Appt start time: 1615 end time:1715.  Assessment:  Primary concerns today: Patient here for initiation of Medtronic Continuous Glucose Monitoring.   Medications: see list     Intervention:   Understanding Glucose Sensing Programming Sensor Information  Low Glucose Threshold: OFF  High Glucose: 280 mg/dl  Low Glucose 80 mg/dl  Low Predictive Alert: 10 minutes  Other settings to be added at follow up visit Starting [employer] of Product  Entering BG, Calibration Technique and Graphs with Public librarian and Alarms   Follow Up Patient to schedule visit with me for CGM follow up within 2 week(s).

## 2012-09-26 ENCOUNTER — Ambulatory Visit: Payer: BC Managed Care – PPO | Admitting: Endocrinology

## 2012-09-30 ENCOUNTER — Encounter: Payer: Self-pay | Admitting: Endocrinology

## 2012-09-30 ENCOUNTER — Ambulatory Visit (INDEPENDENT_AMBULATORY_CARE_PROVIDER_SITE_OTHER): Payer: BC Managed Care – PPO | Admitting: Endocrinology

## 2012-09-30 ENCOUNTER — Telehealth: Payer: Self-pay | Admitting: Endocrinology

## 2012-09-30 VITALS — BP 120/80 | HR 90 | Ht 65.0 in | Wt 158.0 lb

## 2012-09-30 DIAGNOSIS — E109 Type 1 diabetes mellitus without complications: Secondary | ICD-10-CM

## 2012-09-30 DIAGNOSIS — E039 Hypothyroidism, unspecified: Secondary | ICD-10-CM

## 2012-09-30 NOTE — Progress Notes (Signed)
Subjective:    [name], female    DOB: 17-Mar-1970, 42 y.o.   MRN: [id number]  HPI pt returns for f/u of type 1 DM (dx'ed 1984; she has mild if any neuropathy of the lower extremities.  she is unaware of any associated chronic complications.  he has been on insulin since dx.   She has never had severe hypoglycemia or DKA. She started insulin pump therapy and continuous glucose monitor 2 months ago).  She has made adjustments to her basal rate.  She is uncertain of her total daily insulin dosage, via the pump.  She feels her monitor does not nearly agree with cbg when she calibrates it.   She was on synthroid 137 mcg, 1 1/2 tabs per day.  She missed 4 days last week.   Past Medical History  Diagnosis Date  . High cholesterol   . Diabetes mellitus   . Depression   . Hypothyroidism     Past Surgical History  Procedure Laterality Date  . Appendectomy      History   Social History  . Marital Status: Divorced    Spouse Name: N/A    Number of Children: N/A  . Years of Education: N/A   Occupational History  . Not on file.   Social History Main Topics  . Smoking status: Never Smoker   . Smokeless tobacco: Not on file  . Alcohol Use: Yes     Comment: OCCASIONAL  . Drug Use: No  . Sexual Activity: Yes    Birth Control/ Protection: IUD   Other Topics Concern  . Not on file   Social History Narrative  . No narrative on file    Current Outpatient Prescriptions on File Prior to Visit  Medication Sig Dispense Refill  . atorvastatin (LIPITOR) 10 MG tablet Take 10 mg by mouth daily.        Marland Kitchen buPROPion (WELLBUTRIN XL) 300 MG 24 hr tablet Take 300 mg by mouth every morning.        Marland Kitchen FLUoxetine (PROZAC) 10 MG tablet Take 10-20 mg by mouth daily.        Marland Kitchen glucose blood (BAYER CONTOUR NEXT TEST) test strip Test blood glucose 2-3 times a day. Dx code: 250.01  200 each  10  . insulin glargine (LANTUS) 100 UNIT/ML injection 10 Units in am and 16 Units at bedtime  10  mL  0  . levothyroxine (SYNTHROID, LEVOTHROID) 137 MCG tablet Take 137 mcg by mouth daily before breakfast.      . dicyclomine (BENTYL) 20 MG tablet Take 1 tablet (20 mg total) by mouth 2 (two) times daily.  20 tablet  0  . pantoprazole (PROTONIX) 40 MG tablet Take 1 tablet (40 mg total) by mouth at bedtime.  30 tablet  0   No current facility-administered medications on file prior to visit.    Allergies  Allergen Reactions  . Ciprofloxin Hcl [Ciprofloxacin]   . Food Color Red [Red Dye]   . Penicillins Hives    Family History  Problem Relation Age of Onset  . Coronary artery disease Father     BP 120/80  Pulse 90  Ht 5\' 5"  (1.651 m)  Wt 158 lb (71.668 kg)  BMI 26.29 kg/m2  SpO2 98%  Review of Systems Denies LOC and weight change.     Objective:   Physical Exam VITAL SIGNS:  See vs page GENERAL: no distress SKIN:  Insulin infusion sites at the anterior abdomen are normal.  She is not wearing the continuous glucose monitor site now.   Lab Results  Component Value Date   HGBA1C 9.1* 09/30/2012   Lab Results  Component Value Date   TSH 0.17* 09/30/2012      Assessment & Plan:  DM: This insulin pump regimen was chosen from multiple options, as it best matches her insulin to her changing requirements throughout the day.  The benefits of glycemic control must be weighed against the risks of hypoglycemia.   Hypothyroidism: overreplaced

## 2012-09-30 NOTE — Telephone Encounter (Signed)
Thank you.  i took it off the list

## 2012-09-30 NOTE — Telephone Encounter (Signed)
Pt states at check out that she no longer takes Lantus / Sherri S.

## 2012-09-30 NOTE — Patient Instructions (Addendum)
check your blood sugar 4 times a day.  vary the time of day when you check, between before the 3 meals, and at bedtime.  also check if you have symptoms of your blood sugar being too high or too low.  please keep a record of the readings and bring it to your next appointment here.  please call us sooner if your blood sugar goes below 70, or if you have a lot of readings over 200.   A diabetes blood test is requested for you today.  We'll contact you with results.  continue basal rate of units/hr, except for 0.7 units/hr, 3 am to 6 am.   continue mealtime bolus of 1 unit/ 15 grams carbohydrate.  continue correction bolus (which some people call "sensitivity," or "insulin sensitivity ratio," or just "isr") of 1 unit for each 50 by which your glucose exceeds 100.  Please continue to work with your trainer about any problems with your monitor.

## 2012-10-01 ENCOUNTER — Other Ambulatory Visit: Payer: Self-pay | Admitting: Endocrinology

## 2012-10-02 ENCOUNTER — Encounter: Payer: BC Managed Care – PPO | Attending: Endocrinology | Admitting: *Deleted

## 2012-10-02 DIAGNOSIS — Z9641 Presence of insulin pump (external) (internal): Secondary | ICD-10-CM | POA: Insufficient documentation

## 2012-10-02 DIAGNOSIS — E119 Type 2 diabetes mellitus without complications: Secondary | ICD-10-CM | POA: Insufficient documentation

## 2012-10-02 DIAGNOSIS — Z713 Dietary counseling and surveillance: Secondary | ICD-10-CM | POA: Insufficient documentation

## 2012-10-02 DIAGNOSIS — E109 Type 1 diabetes mellitus without complications: Secondary | ICD-10-CM

## 2012-10-02 NOTE — Patient Instructions (Signed)
Plan: Consider using Dual Wave for higher fat meals, suggest 2 hour duration for Square portion of bolus Consider using Temp Basal for increased activity times and double the duration time of the planned activity to help prevent low BG afterwards too.

## 2012-10-09 NOTE — Progress Notes (Signed)
  Pump Follow Up Progress Note  Reviewed blood glucose logs on 10/02/12 via:  [facility]  and found the following:            Hypoglycemia Hyperglycemia Comments  Overnight Period:      Pre-Meal:    Breakfast      Lunch      Supper     Post-Meal: Breakfast      Lunch      Supper     Bedtime:   YES BASAL   Comments: Average BG for past 2 weeks is 163 mg/dl +/- 76 mg/dl. Patient content with overall BG control but is getting excessive alarms with CGM and would like to adjust those settings today.  We modified the Low Glucose Alerts as follows: changes are in Bold Print MN: 60 7 AM: 80 10 PM: 60 And extended the Low Glucose Alert from 15 to 45 minutes to decrease Repeat Alarms when low, especially when sleeping  We lowered the High Glucose Alert down from 280 to 250 mg/dl  We added the High Predictive Alert to alert her 20 minutes before hitting the High Alert of 250 Mg/dl  Plan: Patient to continue using Bolus Wizard and [employer] Sensor as directed. Contact me if any questions or concerns. She expresses interest in attending DM1 Support Group that is being initiated next month.   Follow up:  Patient to upload to [facility] PRN for further review

## 2012-10-15 ENCOUNTER — Telehealth: Payer: Self-pay | Admitting: Endocrinology

## 2012-10-15 MED ORDER — LEVOTHYROXINE SODIUM 137 MCG PO TABS: 137.0000 ug | ORAL_TABLET | Freq: Every day | ORAL | Status: DC

## 2012-10-15 NOTE — Telephone Encounter (Signed)
rx sent to [facility] [address] 

## 2012-11-13 ENCOUNTER — Encounter: Payer: BC Managed Care – PPO | Attending: Endocrinology | Admitting: *Deleted

## 2012-11-13 DIAGNOSIS — E119 Type 2 diabetes mellitus without complications: Secondary | ICD-10-CM | POA: Insufficient documentation

## 2012-11-13 DIAGNOSIS — E109 Type 1 diabetes mellitus without complications: Secondary | ICD-10-CM

## 2012-11-13 DIAGNOSIS — Z9641 Presence of insulin pump (external) (internal): Secondary | ICD-10-CM | POA: Insufficient documentation

## 2012-11-13 DIAGNOSIS — Z713 Dietary counseling and surveillance: Secondary | ICD-10-CM | POA: Insufficient documentation

## 2012-11-20 NOTE — Progress Notes (Signed)
  Pump Follow Up Progress Note  Reviewed blood glucose logs on 11/13/12 via:  [facility]  and found the following:            Hypoglycemia Hyperglycemia Comments  Overnight Period:      Pre-Meal:    Breakfast      Lunch      Supper     Post-Meal: Breakfast      Lunch      Supper     Bedtime:       Comments: Average BG for past 2 weeks is 152 mg/dl +/- 61 mg/dl. No changes to insulin pump settings. She is happier with new CGM Alerts as well.   Plan: Patient to continue using Bolus Wizard and Enlyte Sensor as directed. Contact me if any questions or concerns. She expresses interest in attending DM1 Support Group that is being initiated next month.   Follow up:  Patient to upload to [facility] PRN for further review

## 2012-11-28 ENCOUNTER — Other Ambulatory Visit: Payer: Self-pay

## 2012-12-06 ENCOUNTER — Other Ambulatory Visit: Payer: Self-pay | Admitting: *Deleted

## 2012-12-06 ENCOUNTER — Telehealth: Payer: Self-pay

## 2012-12-06 MED ORDER — INSULIN ASPART 100 UNIT/ML ~~LOC~~ SOLN: SUBCUTANEOUS | Status: DC

## 2012-12-06 NOTE — Telephone Encounter (Signed)
The patient called hoping to get a refill on her Novolog sent to the [facility] pharmacy.

## 2012-12-07 NOTE — Telephone Encounter (Signed)
Please refill prn 

## 2012-12-25 ENCOUNTER — Encounter: Payer: BC Managed Care – PPO | Attending: Endocrinology | Admitting: *Deleted

## 2012-12-25 DIAGNOSIS — E119 Type 2 diabetes mellitus without complications: Secondary | ICD-10-CM | POA: Insufficient documentation

## 2012-12-25 DIAGNOSIS — E109 Type 1 diabetes mellitus without complications: Secondary | ICD-10-CM

## 2012-12-25 DIAGNOSIS — Z713 Dietary counseling and surveillance: Secondary | ICD-10-CM | POA: Insufficient documentation

## 2012-12-25 DIAGNOSIS — Z9641 Presence of insulin pump (external) (internal): Secondary | ICD-10-CM | POA: Insufficient documentation

## 2012-12-25 NOTE — Patient Instructions (Signed)
Plan: We added the Rate of Change Alerts @ 3.0 mg/dl per minute so you will be alerted when your BG climbs or drops more than 60 pts in 20 minutes.

## 2012-12-30 NOTE — Progress Notes (Signed)
  Pump Follow Up Progress Note  Reviewed blood glucose logs on 12/25/12 via:  [facility]  and found the following:            Hypoglycemia Hyperglycemia Comments  Overnight Period:      Pre-Meal:    Breakfast      Lunch      Supper     Post-Meal: Breakfast      Lunch      Supper  YES   Bedtime:   YES    Comments: Average BG for past 2 weeks has increased from152 mg/dl to 960 mg/dl. Per [employer] DashBoard Report, majority of hyperglycemia is with Rising Sensor Rate of Change, which that alert has not yet been turned on. Patient chooses to not make any changes to insulin pump settings, but we have turned on the CGM Rate of Change Alert @ 3.0 mg/dl per minute for both Rise and Fall Rates. This will alert her to rises of 60 mg.dl in 20 minutes so she can provide insulin sooner.    Plan: Patient to continue using Bolus Wizard and Enlyte Sensor as directed. Contact me if any questions or concerns. She expresses interest in attending DM1 Support Group that is being initiated next month.  Follow up:  Patient to upload to [facility] PRN for further review

## 2013-03-27 ENCOUNTER — Telehealth: Payer: Self-pay | Admitting: *Deleted

## 2013-03-27 MED ORDER — INSULIN ASPART 100 UNIT/ML ~~LOC~~ SOLN: SUBCUTANEOUS | Status: DC

## 2013-03-27 NOTE — Telephone Encounter (Signed)
Medication refilled. Pt is not due for lab work.

## 2013-06-30 ENCOUNTER — Other Ambulatory Visit: Payer: Self-pay

## 2013-06-30 MED ORDER — LEVOTHYROXINE SODIUM 137 MCG PO TABS: 137.0000 ug | ORAL_TABLET | Freq: Every day | ORAL | Status: DC

## 2013-07-10 ENCOUNTER — Telehealth: Payer: Self-pay | Admitting: Endocrinology

## 2013-07-10 NOTE — Telephone Encounter (Signed)
Patient would like to know if Jinny Blossom could help her with some information regarding her insulin for her pump  She states she is in a financial burden and cannot come to see Dr. Loanne Drilling until she gets student loans  She also needs her thyroid medicine filled as well    Please advise patient

## 2013-07-11 ENCOUNTER — Other Ambulatory Visit: Payer: Self-pay | Admitting: *Deleted

## 2013-07-11 MED ORDER — LEVOTHYROXINE SODIUM 137 MCG PO TABS: 137.0000 ug | ORAL_TABLET | Freq: Every day | ORAL | Status: DC

## 2013-07-14 ENCOUNTER — Other Ambulatory Visit: Payer: Self-pay

## 2013-07-14 MED ORDER — LEVOTHYROXINE SODIUM 137 MCG PO TABS: 137.0000 ug | ORAL_TABLET | Freq: Every day | ORAL | Status: DC

## 2013-07-14 MED ORDER — INSULIN ASPART 100 UNIT/ML ~~LOC~~ SOLN: SUBCUTANEOUS | Status: DC

## 2013-07-14 NOTE — Telephone Encounter (Signed)
Called pt. She stated that she needed medication refills for her Thyroid and Novolog. Pt was last seen on 09/2012. Refills were sent. Pt advised to make appointment as soon as possible.

## 2013-08-21 ENCOUNTER — Telehealth: Payer: Self-pay

## 2013-08-21 NOTE — Telephone Encounter (Signed)
Diabetic Bundle. Lvom for pt to call back a schedule appointment with Dr. Loanne Drilling.

## 2013-09-19 ENCOUNTER — Telehealth: Payer: Self-pay | Admitting: Endocrinology

## 2013-09-19 NOTE — Telephone Encounter (Signed)
Called pt and advised Labs had not been received. Requested labs from [facility] due to pt having an appointment on Monday.

## 2013-09-19 NOTE — Telephone Encounter (Signed)
Patient had lab results fax over would like to know if you recieved them. Please advise

## 2013-09-22 ENCOUNTER — Encounter: Payer: Self-pay | Admitting: Endocrinology

## 2013-09-22 ENCOUNTER — Ambulatory Visit (INDEPENDENT_AMBULATORY_CARE_PROVIDER_SITE_OTHER): Payer: BC Managed Care – PPO | Admitting: Endocrinology

## 2013-09-22 VITALS — BP 116/68 | HR 77 | Temp 98.2°F | Ht 65.0 in | Wt 163.0 lb

## 2013-09-22 DIAGNOSIS — E109 Type 1 diabetes mellitus without complications: Secondary | ICD-10-CM

## 2013-09-22 MED ORDER — INSULIN ASPART 100 UNIT/ML ~~LOC~~ SOLN: SUBCUTANEOUS | Status: DC

## 2013-09-22 MED ORDER — LEVOTHYROXINE SODIUM 125 MCG PO TABS: 125.0000 ug | ORAL_TABLET | Freq: Every day | ORAL | Status: DC

## 2013-09-22 NOTE — Patient Instructions (Addendum)
check your blood sugar 8 times a day.  vary the time of day when you check, between before the 3 meals, and at bedtime.  also check if you have symptoms of your blood sugar being too high or too low.  please keep a record of the readings and bring it to your next appointment here.  please call us sooner if your blood sugar goes below 70, or if you have a lot of readings over 200.   reduce basal rate to 0.6 units/hr, 24 hrs/day.   increase mealtime bolus to 1 unit/ 12 grams carbohydrate.  continue correction bolus (which some people call "sensitivity," or "insulin sensitivity ratio," or just "isr") of 1 unit for each 50 by which your glucose exceeds 100.  i have sent a prescription to your pharmacy, to reduce the levothyroxine.   Please come back for a follow-up appointment in 2 months.

## 2013-09-22 NOTE — Progress Notes (Signed)
Subjective:    [name], female    DOB: August 25, 1970, 43 y.o.   MRN: [id number]  HPI pt returns for f/u of type 1 DM (dx'ed 1984; no chronic complications; she has been on insulin since dx;   she has never had pancreatitis, severe hypoglycemia or DKA; she started insulin pump therapy in 2014; she stopped continuous glucose monitor, due to cost).    She takes a total of approx 27 units per day, via the pump.   She takes these settings. 3 basal rates, varying from 0.675 to 0.7 units/hr.  (she reduced this due to fasting hypoglycemia).   continue mealtime bolus of 1 unit/15 grams carbohydrate continue correction bolus (which some people call "sensitivity," or "insulin sensitivity ratio," or just "isr") of 1 unit for each by 50 which your glucose exceeds 100. no cbg record, but states cbg's are extremely variable.  There is no trend throughout the day. She takes synthroid 137 mcg qd.  Past Medical History  Diagnosis Date  . High cholesterol   . Diabetes mellitus   . Depression   . Hypothyroidism     Past Surgical History  Procedure Laterality Date  . Appendectomy      History   Social History  . Marital Status: Divorced    Spouse Name: N/A    Number of Children: N/A  . Years of Education: N/A   Occupational History  . Not on file.   Social History Main Topics  . Smoking status: Never Smoker   . Smokeless tobacco: Not on file  . Alcohol Use: Yes     Comment: OCCASIONAL  . Drug Use: No  . Sexual Activity: Yes    Birth Control/ Protection: IUD   Other Topics Concern  . Not on file   Social History Narrative  . No narrative on file    Current Outpatient Prescriptions on File Prior to Visit  Medication Sig Dispense Refill  . atorvastatin (LIPITOR) 10 MG tablet Take 10 mg by mouth daily.        Marland Kitchen buPROPion (WELLBUTRIN XL) 300 MG 24 hr tablet Take 300 mg by mouth every morning.        Marland Kitchen FLUoxetine (PROZAC) 10 MG tablet Take 10-20 mg by mouth daily.         Marland Kitchen glucose blood (BAYER CONTOUR NEXT TEST) test strip Test blood glucose 2-3 times a day. Dx code: 250.01  200 each  10  . dicyclomine (BENTYL) 20 MG tablet Take 1 tablet (20 mg total) by mouth 2 (two) times daily.  20 tablet  0  . pantoprazole (PROTONIX) 40 MG tablet Take 1 tablet (40 mg total) by mouth at bedtime.  30 tablet  0   No current facility-administered medications on file prior to visit.    Allergies  Allergen Reactions  . Ciprofloxin Hcl [Ciprofloxacin]   . Food Color Red [Red Dye]   . Penicillins Hives    Family History  Problem Relation Age of Onset  . Coronary artery disease Father     BP 116/68  Pulse 77  Temp(Src) 98.2 F (36.8 C) (Oral)  Ht 5\' 5"  (1.651 m)  Wt 163 lb (73.936 kg)  BMI 27.12 kg/m2  SpO2 99%     Review of Systems She has gained a few lbs.  Denies LOC    Objective:   Physical Exam VITAL SIGNS:  See vs page GENERAL: no distress Pulses: dorsalis pedis intact bilat.   Feet: no deformity. normal  color and temp.  no edema Skin:  no ulcer on the feet.   Neuro: sensation is intact to touch on the feet    outside test results are reviewed: A1c=8.5 TSH=0.17  i have reviewed the following outside records: Office notes    Assessment & Plan:  DM: moderate exacerbation Hypothyroidism: overcontrolled.    Patient is advised the following: Patient Instructions  check your blood sugar 8 times a day.  vary the time of day when you check, between before the 3 meals, and at bedtime.  also check if you have symptoms of your blood sugar being too high or too low.  please keep a record of the readings and bring it to your next appointment here.  please call us sooner if your blood sugar goes below 70, or if you have a lot of readings over 200.   reduce basal rate to 0.6 units/hr, 24 hrs/day.   increase mealtime bolus to 1 unit/ 12 grams carbohydrate.  continue correction bolus (which some people call "sensitivity," or "insulin sensitivity  ratio," or just "isr") of 1 unit for each 50 by which your glucose exceeds 100.  i have sent a prescription to your pharmacy, to reduce the levothyroxine.   Please come back for a follow-up appointment in 2 months.

## 2013-11-24 ENCOUNTER — Ambulatory Visit: Payer: BC Managed Care – PPO | Admitting: Endocrinology

## 2013-11-28 ENCOUNTER — Telehealth: Payer: Self-pay | Admitting: Endocrinology

## 2013-11-28 NOTE — Telephone Encounter (Signed)
Please advise 

## 2013-11-28 NOTE — Telephone Encounter (Signed)
Pt would like [name] to send her lab orders to her [facility] F# [phone]

## 2013-12-09 ENCOUNTER — Ambulatory Visit: Payer: BC Managed Care – PPO | Admitting: Endocrinology

## 2013-12-10 ENCOUNTER — Other Ambulatory Visit: Payer: Self-pay

## 2013-12-10 DIAGNOSIS — E039 Hypothyroidism, unspecified: Secondary | ICD-10-CM

## 2013-12-10 DIAGNOSIS — E108 Type 1 diabetes mellitus with unspecified complications: Secondary | ICD-10-CM

## 2013-12-15 ENCOUNTER — Ambulatory Visit (INDEPENDENT_AMBULATORY_CARE_PROVIDER_SITE_OTHER): Payer: BC Managed Care – PPO | Admitting: Endocrinology

## 2013-12-15 ENCOUNTER — Encounter: Payer: Self-pay | Admitting: Endocrinology

## 2013-12-15 VITALS — BP 122/76 | HR 94 | Temp 98.1°F | Ht 65.0 in | Wt 167.0 lb

## 2013-12-15 DIAGNOSIS — E108 Type 1 diabetes mellitus with unspecified complications: Secondary | ICD-10-CM

## 2013-12-15 MED ORDER — LOSARTAN POTASSIUM 25 MG PO TABS: 25.0000 mg | ORAL_TABLET | Freq: Every day | ORAL | Status: DC

## 2013-12-15 MED ORDER — LEVOTHYROXINE SODIUM 112 MCG PO TABS: 112.0000 ug | ORAL_TABLET | Freq: Every day | ORAL | Status: DC

## 2013-12-15 NOTE — Progress Notes (Signed)
Subjective:    [name], female    DOB: June 30, 1970, 43 y.o.   MRN: [id number]  HPI  Pt returns for f/u of diabetes mellitus: DM type: 1 Dx'ed: 7829 Complications: none Therapy: insulin since dx;  GDM: never DKA: never Severe hypoglycemia: never Pancreatitis: never Other: she has been on pump (medtronic) rx since 2014; she stopped continuous glucose monitor, due to cost Interval history:  She takes these pump settings. basal rate of 0.6 units/hr, 24 hrs per day. mealtime bolus of 1 unit/12 grams carbohydrate correction bolus (which some people call "sensitivity," or "insulin sensitivity ratio," or just "isr") of 1 unit for each by 50 which your glucose exceeds 100. no cbg record, but states cbg's are still extremely variable.  There is no trend throughout the day.  She averages a total of approx 30 units per day.  She has mild hypoglycemia (usually in the middle of the day, but can be at night), approx 3 times per week.   Past Medical History  Diagnosis Date  . High cholesterol   . Diabetes mellitus   . Depression   . Hypothyroidism     Past Surgical History  Procedure Laterality Date  . Appendectomy      History   Social History  . Marital Status: Divorced    Spouse Name: N/A    Number of Children: N/A  . Years of Education: N/A   Occupational History  . Not on file.   Social History Main Topics  . Smoking status: Never Smoker   . Smokeless tobacco: Not on file  . Alcohol Use: Yes     Comment: OCCASIONAL  . Drug Use: No  . Sexual Activity: Yes    Birth Control/ Protection: IUD   Other Topics Concern  . Not on file   Social History Narrative    Current Outpatient Prescriptions on File Prior to Visit  Medication Sig Dispense Refill  . atorvastatin (LIPITOR) 10 MG tablet Take 10 mg by mouth daily.      Marland Kitchen buPROPion (WELLBUTRIN XL) 300 MG 24 hr tablet Take 300 mg by mouth every morning.      Marland Kitchen FLUoxetine (PROZAC) 10 MG tablet Take  10-20 mg by mouth daily.      Marland Kitchen glucose blood (BAYER CONTOUR NEXT TEST) test strip Test blood glucose 2-3 times a day. Dx code: 250.01 200 each 10  . insulin aspart (NOVOLOG) 100 UNIT/ML injection For use in pump, total of 30 units/day. 3 vial PRN  . Insulin Infusion Pump Supplies (PARADIGM RESERVOIR 3ML) MISC 1 Device by Does not apply route every 3 (three) days.    Marland Kitchen dicyclomine (BENTYL) 20 MG tablet Take 1 tablet (20 mg total) by mouth 2 (two) times daily. 20 tablet 0  . pantoprazole (PROTONIX) 40 MG tablet Take 1 tablet (40 mg total) by mouth at bedtime. 30 tablet 0   No current facility-administered medications on file prior to visit.    Allergies  Allergen Reactions  . Ciprofloxin Hcl [Ciprofloxacin]   . Food Color Red [Red Dye]   . Penicillins Hives    Family History  Problem Relation Age of Onset  . Coronary artery disease Father    BP 122/76 mmHg  Pulse 94  Temp(Src) 98.1 F (36.7 C) (Oral)  Ht 5\' 5"  (1.651 m)  Wt 167 lb (75.751 kg)  BMI 27.79 kg/m2  SpO2 95%  Review of Systems Denies weight change and LOC    Objective:   Physical  Exam VITAL SIGNS:  See vs page GENERAL: no distress Pulses: dorsalis pedis intact bilat.   Feet: no deformity.  no edema Skin:  no ulcer on the feet.  normal color and temp. Neuro: sensation is intact to touch on the feet.     outside test results are reviewed: A1c=8.5 TSH=0.09    Assessment & Plan:  DM: moderate exacerbation Noncompliance with cbg recording, persistent. Hypothyroidism, still overreplaced.   Patient is advised the following: Patient Instructions  check your blood sugar 8 times a day.  vary the time of day when you check, between before the 3 meals, and at bedtime.  also check if you have symptoms of your blood sugar being too high or too low.  please keep a record of the readings and bring it to your next appointment here.  please call us sooner if your blood sugar goes below 70, or if you have a lot of  readings over 200.   continue basal rate of 0.6 units/hr, 24 hrs/day.   Please increase your mealtime bolus to 1 unit/ 11 grams carbohydrate.   continue correction bolus (which some people call "sensitivity," or "insulin sensitivity ratio," or just "isr") of 1 unit for each 50 by which your glucose exceeds 100.  Please come back for a follow-up appointment in 2 months.  Try using www.glucosebuddy.com, for your blood sugar. i have sent 2 prescriptions to your pharmacy: to decrease the levothyroxine, and the "ARB." In view of your medical condition, you should avoid pregnancy until we have decided it is safe

## 2013-12-15 NOTE — Patient Instructions (Addendum)
check your blood sugar 8 times a day.  vary the time of day when you check, between before the 3 meals, and at bedtime.  also check if you have symptoms of your blood sugar being too high or too low.  please keep a record of the readings and bring it to your next appointment here.  please call us sooner if your blood sugar goes below 70, or if you have a lot of readings over 200.   continue basal rate of 0.6 units/hr, 24 hrs/day.   Please increase your mealtime bolus to 1 unit/ 11 grams carbohydrate.   continue correction bolus (which some people call "sensitivity," or "insulin sensitivity ratio," or just "isr") of 1 unit for each 50 by which your glucose exceeds 100.  Please come back for a follow-up appointment in 2 months.  Try using www.glucosebuddy.com, for your blood sugar. i have sent 2 prescriptions to your pharmacy: to decrease the levothyroxine, and the "ARB." In view of your medical condition, you should avoid pregnancy until we have decided it is safe

## 2014-01-09 ENCOUNTER — Encounter: Payer: Self-pay | Admitting: Endocrinology

## 2014-01-12 ENCOUNTER — Telehealth: Payer: Self-pay | Admitting: Endocrinology

## 2014-01-12 NOTE — Telephone Encounter (Signed)
Lvom advising pt to call back discuss side effects she is having.

## 2014-01-12 NOTE — Telephone Encounter (Signed)
Pt having side effects to new med

## 2014-01-19 ENCOUNTER — Telehealth: Payer: Self-pay | Admitting: Endocrinology

## 2014-01-19 NOTE — Telephone Encounter (Signed)
Pt calling to let us know that the ARV was causing side effects of excessive and burning during urination and extreme headaches. She did stop taking the med on 01/07/14 upon her own accord.

## 2014-01-19 NOTE — Telephone Encounter (Signed)
please call patient: Please d/c losartan.  i have removed from med list.

## 2014-01-19 NOTE — Telephone Encounter (Signed)
FYI See below 

## 2014-01-22 NOTE — Telephone Encounter (Signed)
Noted, patient is aware, she wants to know if you are going to prescribe her a new ARV?

## 2014-01-24 NOTE — Telephone Encounter (Signed)
i would be happy to. Are your sxs resolved?

## 2014-01-26 MED ORDER — VALSARTAN 40 MG PO TABS: 40.0000 mg | ORAL_TABLET | Freq: Every day | ORAL | Status: DC

## 2014-01-26 NOTE — Telephone Encounter (Signed)
Ok, i have sent a prescription to your pharmacy, for the ARB

## 2014-01-26 NOTE — Telephone Encounter (Signed)
Requested call back to discuss.  

## 2014-01-26 NOTE — Addendum Note (Signed)
Addended by: Renato Shin on: 01/26/2014 12:30 PM   Modules accepted: Orders

## 2014-01-26 NOTE — Telephone Encounter (Signed)
Contacted pt. She states that since stopping the losartan her symptoms (excessive/ burning urination and headaches) have stopped.

## 2014-01-26 NOTE — Telephone Encounter (Signed)
Pt advised of note below and voiced understanding.  

## 2014-02-16 ENCOUNTER — Ambulatory Visit: Payer: BC Managed Care – PPO | Admitting: Endocrinology

## 2014-07-20 ENCOUNTER — Other Ambulatory Visit: Payer: Self-pay

## 2014-09-25 ENCOUNTER — Telehealth: Payer: Self-pay | Admitting: Endocrinology

## 2014-09-25 NOTE — Telephone Encounter (Signed)
Patient would like to get some lab work drawn before she make and appt, please advise

## 2014-09-25 NOTE — Telephone Encounter (Signed)
I contacted the pt and advised of note below.  Pt voiced understanding and scheduled for 10/08/2014.

## 2014-09-25 NOTE — Telephone Encounter (Signed)
See note below and please advise, thanks!  

## 2014-09-25 NOTE — Telephone Encounter (Signed)
We can do a1c while you are here. For other labs, ask pcp

## 2014-09-30 ENCOUNTER — Other Ambulatory Visit: Payer: Self-pay

## 2014-09-30 MED ORDER — INSULIN ASPART 100 UNIT/ML ~~LOC~~ SOLN: SUBCUTANEOUS | Status: DC

## 2014-10-02 ENCOUNTER — Other Ambulatory Visit: Payer: Self-pay | Admitting: *Deleted

## 2014-10-08 ENCOUNTER — Ambulatory Visit (INDEPENDENT_AMBULATORY_CARE_PROVIDER_SITE_OTHER): Payer: Self-pay | Admitting: Endocrinology

## 2014-10-08 ENCOUNTER — Encounter: Payer: Self-pay | Admitting: Endocrinology

## 2014-10-08 VITALS — BP 122/80 | HR 75 | Temp 97.9°F | Ht 65.0 in | Wt 161.0 lb

## 2014-10-08 DIAGNOSIS — E108 Type 1 diabetes mellitus with unspecified complications: Secondary | ICD-10-CM

## 2014-10-08 DIAGNOSIS — E039 Hypothyroidism, unspecified: Secondary | ICD-10-CM

## 2014-10-08 LAB — BASIC METABOLIC PANEL
BUN: 12 mg/dL (ref 6–23)
CHLORIDE: 102 meq/L (ref 96–112)
CO2: 28 mEq/L (ref 19–32)
Calcium: 9.7 mg/dL (ref 8.4–10.5)
Creatinine, Ser: 0.81 mg/dL (ref 0.40–1.20)
GFR: 80.99 mL/min (ref >60.00)
GLUCOSE: 210 mg/dL — AB (ref 70–99)
POTASSIUM: 5.1 meq/L (ref 3.5–5.1)
SODIUM: 136 meq/L (ref 135–145)

## 2014-10-08 LAB — URINALYSIS, ROUTINE W REFLEX MICROSCOPIC
Bilirubin Urine: NEGATIVE
KETONES UR: NEGATIVE
Nitrite: NEGATIVE
SPECIFIC GRAVITY, URINE: 1.015 (ref 1.000–1.030)
TOTAL PROTEIN, URINE-UPE24: NEGATIVE
URINE GLUCOSE: 100 — AB
UROBILINOGEN UA: 0.2 (ref 0.0–1.0)
pH: 6.5 (ref 5.0–8.0)

## 2014-10-08 LAB — MICROALBUMIN / CREATININE URINE RATIO
Creatinine,U: 79 mg/dL
MICROALB/CREAT RATIO: 0.9 mg/g (ref 0.0–30.0)

## 2014-10-08 LAB — LIPID PANEL
CHOL/HDL RATIO: 4
CHOLESTEROL: 180 mg/dL (ref 0–200)
HDL: 48.3 mg/dL (ref >39.00)
LDL Cholesterol: 113 mg/dL — ABNORMAL HIGH (ref 0–99)
NonHDL: 131.7
TRIGLYCERIDES: 92 mg/dL (ref 0.0–149.0)
VLDL: 18.4 mg/dL (ref 0.0–40.0)

## 2014-10-08 LAB — HEMOGLOBIN A1C: HEMOGLOBIN A1C: 8.6 % — AB (ref 4.6–6.5)

## 2014-10-08 LAB — TSH: TSH: 0.95 u[IU]/mL (ref 0.35–4.50)

## 2014-10-08 LAB — POCT GLYCOSYLATED HEMOGLOBIN (HGB A1C): HEMOGLOBIN A1C: 8.2

## 2014-10-08 MED ORDER — ENLITE GLUCOSE SENSOR MISC: 1.0000 | Status: DC

## 2014-10-08 NOTE — Patient Instructions (Addendum)
check your blood sugar 8 times a day.  vary the time of day when you check, between before the 3 meals, and at bedtime.  also check if you have symptoms of your blood sugar being too high or too low.  please keep a record of the readings and bring it to your next appointment here.  please call us sooner if your blood sugar goes below 70, or if you have a lot of readings over 200.   continue basal rate of 0.6 units/hr, 24 hrs/day.   Please increase your mealtime bolus to 1 unit/ 11 grams carbohydrate.  However, take 2 extra units to your calculated breakfast bolus.   continue correction bolus (which some people call "sensitivity," or "insulin sensitivity ratio," or just "isr") of 1 unit for each 50 by which your glucose exceeds 100.  Please come back for a follow-up appointment in 2 months.   please call [phone], to get an appointment with a new primary doctor.

## 2014-10-08 NOTE — Progress Notes (Signed)
Subjective:    [name], female    DOB: [birth year], 44 y.o.   MRN: [id number]  HPI Pt returns for f/u of diabetes mellitus: DM type: 1 Dx'ed: 4193 Complications: none Therapy: insulin since dx;  GDM: never DKA: never Severe hypoglycemia: never Pancreatitis: never Other: she has been on pump (medtronic) rx since 2014; she stopped continuous glucose monitor, due to cost Interval history:  She takes these pump settings: basal rate of 0.6 units/hr, 24 hrs per day. mealtime bolus of 1 unit/11 grams carbohydrate correction bolus (which some people call "sensitivity," or "insulin sensitivity ratio," or just "isr") of 1 unit for each by 50 which your glucose exceeds 100. Meter is downloaded today, and the printout is scanned into the record.  It varies from 50-450.  most are in the 200's and 300's.  It is in general highest at lunch, but not necessarily so.  She averages a total of approx 33 units per day.  she reports mild hypoglycemia approx 1-2 times per week.  This happens at any time of day.   Past Medical History  Diagnosis Date  . High cholesterol   . Diabetes mellitus   . Depression   . Hypothyroidism     Past Surgical History  Procedure Laterality Date  . Appendectomy      Social History   Social History  . Marital Status: Divorced    Spouse Name: N/A  . Number of Children: N/A  . Years of Education: N/A   Occupational History  . Not on file.   Social History Main Topics  . Smoking status: Never Smoker   . Smokeless tobacco: Not on file  . Alcohol Use: Yes     Comment: OCCASIONAL  . Drug Use: No  . Sexual Activity: Yes    Birth Control/ Protection: IUD   Other Topics Concern  . Not on file   Social History Narrative    Current Outpatient Prescriptions on File Prior to Visit  Medication Sig Dispense Refill  . buPROPion (WELLBUTRIN XL) 300 MG 24 hr tablet Take 300 mg by mouth every morning.      Marland Kitchen FLUoxetine (PROZAC) 10 MG tablet  Take 10-20 mg by mouth daily.      Marland Kitchen glucose blood (BAYER CONTOUR NEXT TEST) test strip Test blood glucose 2-3 times a day. Dx code: 250.01 200 each 10  . insulin aspart (NOVOLOG) 100 UNIT/ML injection For use in pump, total of 30 units/day. 1 vial 1  . Insulin Infusion Pump Supplies (MINIMED INFUSION SET-MMT 399) MISC 1 Device by Does not apply route every 3 (three) days.    . Insulin Infusion Pump Supplies (PARADIGM RESERVOIR 3ML) MISC 1 Device by Does not apply route every 3 (three) days.    Marland Kitchen levothyroxine (SYNTHROID, LEVOTHROID) 112 MCG tablet Take 1 tablet (112 mcg total) by mouth daily before breakfast. 90 tablet 3  . atorvastatin (LIPITOR) 10 MG tablet Take 10 mg by mouth daily.      . valsartan (DIOVAN) 40 MG tablet Take 1 tablet (40 mg total) by mouth daily. (Patient not taking: Reported on 10/08/2014) 30 tablet 11   No current facility-administered medications on file prior to visit.    Allergies  Allergen Reactions  . Ciprofloxin Hcl [Ciprofloxacin]   . Food Color Red [Red Dye]   . Penicillins Hives    Family History  Problem Relation Age of Onset  . Coronary artery disease Father     BP 122/80 mmHg  Pulse 75  Temp(Src) 97.9 F (36.6 C) (Oral)  Ht 5\' 5"  (1.651 m)  Wt 161 lb (73.029 kg)  BMI 26.79 kg/m2  SpO2 97%  Review of Systems Denies LOC.    Objective:   Physical Exam VITAL SIGNS:  See vs page GENERAL: no distress Pulses: dorsalis pedis intact bilat.   MSK: no deformity of the feet CV: no leg edema.  Skin:  no ulcer on the feet.  normal color and temp on the feet. Neuro: sensation is intact to touch on the feet.     A1c=8.2%    Assessment & Plan:  DM: she needs increased rx  Patient is advised the following: Patient Instructions  check your blood sugar 8 times a day.  vary the time of day when you check, between before the 3 meals, and at bedtime.  also check if you have symptoms of your blood sugar being too high or too low.  please keep a record  of the readings and bring it to your next appointment here.  please call us sooner if your blood sugar goes below 70, or if you have a lot of readings over 200.   continue basal rate of 0.6 units/hr, 24 hrs/day.   Please increase your mealtime bolus to 1 unit/ 11 grams carbohydrate.  However, take 2 extra units to your calculated breakfast bolus.   continue correction bolus (which some people call "sensitivity," or "insulin sensitivity ratio," or just "isr") of 1 unit for each 50 by which your glucose exceeds 100.  Please come back for a follow-up appointment in 2 months.   please call [phone], to get an appointment with a new primary doctor.

## 2014-12-07 ENCOUNTER — Ambulatory Visit (INDEPENDENT_AMBULATORY_CARE_PROVIDER_SITE_OTHER): Payer: BC Managed Care – PPO | Admitting: Internal Medicine

## 2014-12-07 ENCOUNTER — Encounter: Payer: Self-pay | Admitting: Internal Medicine

## 2014-12-07 VITALS — BP 120/70 | HR 83 | Temp 98.9°F | Resp 12 | Ht 65.0 in | Wt 160.1 lb

## 2014-12-07 DIAGNOSIS — F32A Depression, unspecified: Secondary | ICD-10-CM

## 2014-12-07 DIAGNOSIS — S99922A Unspecified injury of left foot, initial encounter: Secondary | ICD-10-CM

## 2014-12-07 DIAGNOSIS — E039 Hypothyroidism, unspecified: Secondary | ICD-10-CM

## 2014-12-07 DIAGNOSIS — E785 Hyperlipidemia, unspecified: Secondary | ICD-10-CM

## 2014-12-07 DIAGNOSIS — F329 Major depressive disorder, single episode, unspecified: Secondary | ICD-10-CM

## 2014-12-07 DIAGNOSIS — E108 Type 1 diabetes mellitus with unspecified complications: Secondary | ICD-10-CM

## 2014-12-07 DIAGNOSIS — Z23 Encounter for immunization: Secondary | ICD-10-CM

## 2014-12-07 MED ORDER — ATORVASTATIN CALCIUM 10 MG PO TABS: 10.0000 mg | ORAL_TABLET | Freq: Every day | ORAL | Status: DC

## 2014-12-07 MED ORDER — MONTELUKAST SODIUM 10 MG PO TABS: 10.0000 mg | ORAL_TABLET | Freq: Every day | ORAL | Status: DC

## 2014-12-07 MED ORDER — LEVOTHYROXINE SODIUM 112 MCG PO TABS: 112.0000 ug | ORAL_TABLET | Freq: Every day | ORAL | Status: DC

## 2014-12-07 MED ORDER — FLUOXETINE HCL 10 MG PO TABS: 10.0000 mg | ORAL_TABLET | Freq: Every day | ORAL | Status: DC

## 2014-12-07 MED ORDER — BUPROPION HCL ER (XL) 300 MG PO TB24: 300.0000 mg | ORAL_TABLET | ORAL | Status: DC

## 2014-12-07 NOTE — Assessment & Plan Note (Signed)
Sees endo, stable on synthroid 112 mcg daily.

## 2014-12-07 NOTE — Progress Notes (Signed)
Pre visit review using our clinic review tool, if applicable. No additional management support is needed unless otherwise documented below in the visit note. 

## 2014-12-07 NOTE — Assessment & Plan Note (Signed)
Should be taking lipitor 10 mg daily (which she is not taking now). Advised her based on her most recent LDL  (1-2 months ago) needs it. She agrees to start taking that.

## 2014-12-07 NOTE — Assessment & Plan Note (Signed)
No infection, rash. Appears to be a small cut. No neuropathy in the foot and pedal pulses intact. Advised to keep the area clean and dry and avoid further injury. Talked to her about shoes that are more protective (wearing flip flops today) and she will work on it until healed. If not healed in 2 weeks or worsening she will call.

## 2014-12-07 NOTE — Patient Instructions (Signed)
We have given you the flu shot today.   Keep watching the toe for full healing.   Think about using moisturizing eye drops before bedtime to see if it helps.   We have given you the prescriptions today for the 3 month supply.   Come back in about 6-12 months for a check up and call us sooner if you have problems or questions.   Diabetes and Exercise Exercising regularly is important. It is not just about losing weight. It has many health benefits, such as:  Improving your overall fitness, flexibility, and endurance.  Increasing your bone density.  Helping with weight control.  Decreasing your body fat.  Increasing your muscle strength.  Reducing stress and tension.  Improving your overall health. People with diabetes who exercise gain additional benefits because exercise:  Reduces appetite.  Improves the body's use of blood sugar (glucose).  Helps lower or control blood glucose.  Decreases blood pressure.  Helps control blood lipids (such as cholesterol and triglycerides).  Improves the body's use of the hormone insulin by:  Increasing the body's insulin sensitivity.  Reducing the body's insulin needs.  Decreases the risk for heart disease because exercising:  Lowers cholesterol and triglycerides levels.  Increases the levels of good cholesterol (such as high-density lipoproteins [HDL]) in the body.  Lowers blood glucose levels. YOUR ACTIVITY PLAN  Choose an activity that you enjoy, and set realistic goals. To exercise safely, you should begin practicing any new physical activity slowly, and gradually increase the intensity of the exercise over time. Your health care provider or diabetes educator can help create an activity plan that works for you. General recommendations include:  Encouraging children to engage in at least 60 minutes of physical activity each day.  Stretching and performing strength training exercises, such as yoga or weight lifting, at least  2 times per week.  Performing a total of at least 150 minutes of moderate-intensity exercise each week, such as brisk walking or water aerobics.  Exercising at least 3 days per week, making sure you allow no more than 2 consecutive days to pass without exercising.  Avoiding long periods of inactivity (90 minutes or more). When you have to spend an extended period of time sitting down, take frequent breaks to walk or stretch. RECOMMENDATIONS FOR EXERCISING WITH TYPE 1 OR TYPE 2 DIABETES   Check your blood glucose before exercising. If blood glucose levels are greater than 240 mg/dL, check for urine ketones. Do not exercise if ketones are present.  Avoid injecting insulin into areas of the body that are going to be exercised. For example, avoid injecting insulin into:  The arms when playing tennis.  The legs when jogging.  Keep a record of:  Food intake before and after you exercise.  Expected peak times of insulin action.  Blood glucose levels before and after you exercise.  The type and amount of exercise you have done.  Review your records with your health care provider. Your health care provider will help you to develop guidelines for adjusting food intake and insulin amounts before and after exercising.  If you take insulin or oral hypoglycemic agents, watch for signs and symptoms of hypoglycemia. They include:  Dizziness.  Shaking.  Sweating.  Chills.  Confusion.  Drink plenty of water while you exercise to prevent dehydration or heat stroke. Body water is lost during exercise and must be replaced.  Talk to your health care provider before starting an exercise program to make sure it is  safe for you. Remember, almost any type of activity is better than none.   This information is not intended to replace advice given to you by your health care provider. Make sure you discuss any questions you have with your health care provider.   Document Released: 04/01/2003  Document Revised: 05/26/2014 Document Reviewed: 06/18/2012 Elsevier Interactive Patient Education [employer].

## 2014-12-07 NOTE — Progress Notes (Signed)
   Subjective:    [name], female    DOB: 08/06/1970, 44 y.o.   MRN: [id number]  HPI The patient is a new 44 YO female coming in for left toe wound. She is a diabetic for 30+ years (type 1) on insulin pump. She is fairly well controlled. Denies any numbness or neuropathy in her feet. Did cut her toe about 4-5 days ago. It is healing, minimal pain, no drainage, minimal redness. No fevers or chills. Does pay close attention to her feet. Sugars have not been running higher than usual. Please see A/P for status and treatment of chronic medical problems. Is in grad school and finishing in about 2 weeks and is worried about not having insurance or job and is looking for a job now.   PMH, [facility], social history reviewed and updated.   Review of Systems  Constitutional: Negative for fever, activity change, appetite change, fatigue and unexpected weight change.  HENT: Negative.   Eyes: Negative.   Respiratory: Negative for cough, chest tightness, shortness of breath and wheezing.   Cardiovascular: Negative for chest pain, palpitations and leg swelling.  Gastrointestinal: Negative for nausea, abdominal pain, diarrhea, constipation, blood in stool and abdominal distention.  Musculoskeletal: Negative for myalgias, back pain, arthralgias and neck pain.  Skin: Positive for wound. Negative for color change, pallor and rash.  Neurological: Negative.   Psychiatric/Behavioral: Negative.       Objective:   Physical Exam  Constitutional: She is oriented to person, place, and time. She appears well-developed and well-nourished.  HENT:  Head: Normocephalic and atraumatic.  Eyes: EOM are normal.  Neck: Normal range of motion.  Cardiovascular: Normal rate and regular rhythm.   No murmur heard. Pulmonary/Chest: Effort normal and breath sounds normal. No respiratory distress. She has no wheezes. She has no rales.  Abdominal: Soft. Bowel sounds are normal. She exhibits no distension. There is  no tenderness. There is no rebound.  Musculoskeletal: She exhibits no edema.  Neurological: She is alert and oriented to person, place, and time. Coordination normal.  Skin: Skin is warm and dry.  Small cut on the left 3rd toe, no tracking, no purulent discharge. No cellulitis or infection noted.   Psychiatric: She has a normal mood and affect.   Filed Vitals:   12/07/14 0859  BP: 120/70  Pulse: 83  Temp: 98.9 F (37.2 C)  TempSrc: Oral  Resp: 12  Height: 5\' 5"  (1.651 m)  Weight: 160 lb 1.9 oz (72.63 kg)  SpO2: 99%      Assessment & Plan:  Flu shot given at visit.

## 2014-12-07 NOTE — Assessment & Plan Note (Signed)
Sees endo, on insulin pump.

## 2014-12-07 NOTE — Assessment & Plan Note (Signed)
Refill of wellbutrin and prozac today. Mood stable and well controlled.

## 2014-12-08 ENCOUNTER — Ambulatory Visit (INDEPENDENT_AMBULATORY_CARE_PROVIDER_SITE_OTHER): Payer: BC Managed Care – PPO | Admitting: Endocrinology

## 2014-12-08 ENCOUNTER — Encounter: Payer: Self-pay | Admitting: Endocrinology

## 2014-12-08 VITALS — BP 112/64 | HR 84 | Temp 98.0°F | Ht 65.0 in | Wt 165.0 lb

## 2014-12-08 DIAGNOSIS — E108 Type 1 diabetes mellitus with unspecified complications: Secondary | ICD-10-CM

## 2014-12-08 LAB — POCT GLYCOSYLATED HEMOGLOBIN (HGB A1C): Hemoglobin A1C: 7.9

## 2014-12-08 MED ORDER — INSULIN ASPART 100 UNIT/ML ~~LOC~~ SOLN: SUBCUTANEOUS | Status: DC

## 2014-12-08 NOTE — Progress Notes (Signed)
Subjective:    [name], female    DOB: Mar 03, 1970, 44 y.o.   MRN: [id number]  HPI Pt returns for f/u of diabetes mellitus: DM type: 1 Dx'ed: Q000111Q Complications: none Therapy: insulin since dx. GDM: never DKA: never Severe hypoglycemia: never Pancreatitis: never Other: she has been on pump (medtronic) rx since 2014; she stopped continuous glucose monitor, due to cost Interval history:  She takes these pump settings: basal rate of 0.6 units/hr, 24 hrs per day. mealtime bolus of 1 unit/11 grams carbohydrate correction bolus (which some people call "sensitivity," or "insulin sensitivity ratio," or just "isr") of 1 unit for each by 50 which your glucose exceeds 100. Meter is downloaded today, and the printout is scanned into the record.  It varies from 50-470.  most are in the 100's and 200's.  It is in general highest after meals, but not necessarily so.  She averages a total of approx 35 units per day.  she reports mild hypoglycemia approx 1-2 times per week.  This happens at any time of day.   Past Medical History  Diagnosis Date  . High cholesterol   . Diabetes mellitus   . Depression   . Hypothyroidism   . Anxiety     Past Surgical History  Procedure Laterality Date  . Appendectomy      Social History   Social History  . Marital Status: Divorced    Spouse Name: N/A  . Number of Children: N/A  . Years of Education: N/A   Occupational History  . Not on file.   Social History Main Topics  . Smoking status: Never Smoker   . Smokeless tobacco: Not on file  . Alcohol Use: Yes     Comment: OCCASIONAL  . Drug Use: No  . Sexual Activity: Yes    Birth Control/ Protection: IUD   Other Topics Concern  . Not on file   Social History Narrative    Current Outpatient Prescriptions on File Prior to Visit  Medication Sig Dispense Refill  . atorvastatin (LIPITOR) 10 MG tablet Take 1 tablet (10 mg total) by mouth daily. 90 tablet 3  . buPROPion  (WELLBUTRIN XL) 300 MG 24 hr tablet Take 1 tablet (300 mg total) by mouth every morning. 90 tablet 3  . Continuous Glucose Monitor Sup (ENLITE GLUCOSE SENSOR) MISC 1 Device by Does not apply route once a week. 13 each 3  . FLUoxetine (PROZAC) 10 MG tablet Take 1-2 tablets (10-20 mg total) by mouth daily. 180 tablet 3  . glucose blood (BAYER CONTOUR NEXT TEST) test strip Test blood glucose 2-3 times a day. Dx code: 250.01 200 each 10  . Insulin Infusion Pump Supplies (MINIMED INFUSION SET-MMT 399) MISC 1 Device by Does not apply route every 3 (three) days.    . Insulin Infusion Pump Supplies (PARADIGM RESERVOIR 3ML) MISC 1 Device by Does not apply route every 3 (three) days.    Marland Kitchen levothyroxine (SYNTHROID, LEVOTHROID) 112 MCG tablet Take 1 tablet (112 mcg total) by mouth daily before breakfast. 90 tablet 3  . montelukast (SINGULAIR) 10 MG tablet Take 1 tablet (10 mg total) by mouth at bedtime. 90 tablet 3  . valsartan (DIOVAN) 40 MG tablet Take 1 tablet (40 mg total) by mouth daily. (Patient not taking: Reported on 10/08/2014) 30 tablet 11   No current facility-administered medications on file prior to visit.    Allergies  Allergen Reactions  . Ciprofloxin Hcl [Ciprofloxacin]   . Food Color  Red [Red Dye]   . Penicillins Hives    Family History  Problem Relation Age of Onset  . Coronary artery disease Father   . Cancer Mother     T cell lymphoma    BP 112/64 mmHg  Pulse 84  Temp(Src) 98 F (36.7 C) (Oral)  Ht 5\' 5"  (1.651 m)  Wt 165 lb (74.844 kg)  BMI 27.46 kg/m2  SpO2 98%  LMP 10/30/2014    Review of Systems Denies LOC    Objective:   Physical Exam VITAL SIGNS:  See vs page GENERAL: no distress Pulses: dorsalis pedis intact bilat.   MSK: no deformity of the feet CV: no leg edema Skin:  no ulcer on the feet.  normal color and temp on the feet. Neuro: sensation is intact to touch on the feet.     A1c=7.9%    Assessment & Plan:  DM: she needs increased rx  Patient  is advised the following: Patient Instructions  check your blood sugar 8 times a day.  vary the time of day when you check, between before the 3 meals, and at bedtime.  also check if you have symptoms of your blood sugar being too high or too low.  please keep a record of the readings and bring it to your next appointment here.  please call us sooner if your blood sugar goes below 70, or if you have a lot of readings over 200.   reduce basal rate to 0.5 units/hr, 24 hrs/day.   Please increase your mealtime bolus to 1 unit/ 10 grams carbohydrate.  However, take 2 extra units to your calculated breakfast bolus.   continue correction bolus (which some people call "sensitivity," or "insulin sensitivity ratio," or just "isr") of 1 unit for each 50 by which your glucose exceeds 100.  Please come back for a follow-up appointment in 3 months.

## 2014-12-08 NOTE — Patient Instructions (Addendum)
check your blood sugar 8 times a day.  vary the time of day when you check, between before the 3 meals, and at bedtime.  also check if you have symptoms of your blood sugar being too high or too low.  please keep a record of the readings and bring it to your next appointment here.  please call us sooner if your blood sugar goes below 70, or if you have a lot of readings over 200.   reduce basal rate to 0.5 units/hr, 24 hrs/day.   Please increase your mealtime bolus to 1 unit/ 10 grams carbohydrate.  However, take 2 extra units to your calculated breakfast bolus.   continue correction bolus (which some people call "sensitivity," or "insulin sensitivity ratio," or just "isr") of 1 unit for each 50 by which your glucose exceeds 100.  Please come back for a follow-up appointment in 3 months.

## 2015-03-10 ENCOUNTER — Ambulatory Visit (INDEPENDENT_AMBULATORY_CARE_PROVIDER_SITE_OTHER): Payer: 59 | Admitting: Endocrinology

## 2015-03-10 ENCOUNTER — Encounter: Payer: Self-pay | Admitting: Endocrinology

## 2015-03-10 VITALS — BP 122/84 | HR 100 | Temp 99.3°F | Ht 65.0 in | Wt 162.0 lb

## 2015-03-10 DIAGNOSIS — E108 Type 1 diabetes mellitus with unspecified complications: Secondary | ICD-10-CM

## 2015-03-10 LAB — POCT GLYCOSYLATED HEMOGLOBIN (HGB A1C): HEMOGLOBIN A1C: 7.7

## 2015-03-10 MED ORDER — INSULIN ASPART 100 UNIT/ML ~~LOC~~ SOLN: SUBCUTANEOUS | Status: DC

## 2015-03-10 NOTE — Progress Notes (Signed)
Subjective:    [name], female    DOB: [birth year], 45 y.o.   MRN: [id number]  HPI Pt returns for f/u of diabetes mellitus:  DM type: 1  Dx'ed: Q000111Q Complications: none Therapy: insulin since dx. GDM: never DKA: never Severe hypoglycemia: never.  Pancreatitis: never Other: she has been on pump (medtronic) rx since 2014; she stopped continuous glucose monitor, due to cost Interval history:  She takes these pump settings:  3 different basal rates, varying from 0.6-0.65 units/hr.  mealtime bolus of 1 unit/12 grams carbohydrate.   correction bolus (which some people call "sensitivity," or "insulin sensitivity ratio," or just "isr") of 1 unit for each by 60 which your glucose exceeds 100.   Meter is downloaded today, and the printout is scanned into the record.  It varies from 55-500.  most are in the 100's and 200's.  It is in general highest after meals, but not necessarily so.  She averages a total of approx 35 units per day.  she reports mild hypoglycemia approx 1-2 times per week.  This happens at any time of day.   Past Medical History  Diagnosis Date  . High cholesterol   . Diabetes mellitus   . Depression   . Hypothyroidism   . Anxiety     Past Surgical History  Procedure Laterality Date  . Appendectomy      Social History   Social History  . Marital Status: Divorced    Spouse Name: N/A  . Number of Children: N/A  . Years of Education: N/A   Occupational History  . Not on file.   Social History Main Topics  . Smoking status: Never Smoker   . Smokeless tobacco: Not on file  . Alcohol Use: Yes     Comment: OCCASIONAL  . Drug Use: No  . Sexual Activity: Yes    Birth Control/ Protection: IUD   Other Topics Concern  . Not on file   Social History Narrative    Current Outpatient Prescriptions on File Prior to Visit  Medication Sig Dispense Refill  . atorvastatin (LIPITOR) 10 MG tablet Take 1 tablet (10 mg total) by mouth daily. 90  tablet 3  . buPROPion (WELLBUTRIN XL) 300 MG 24 hr tablet Take 1 tablet (300 mg total) by mouth every morning. 90 tablet 3  . Continuous Glucose Monitor Sup (ENLITE GLUCOSE SENSOR) MISC 1 Device by Does not apply route once a week. 13 each 3  . FLUoxetine (PROZAC) 10 MG tablet Take 1-2 tablets (10-20 mg total) by mouth daily. 180 tablet 3  . glucose blood (BAYER CONTOUR NEXT TEST) test strip Test blood glucose 2-3 times a day. Dx code: 250.01 200 each 10  . Insulin Infusion Pump Supplies (MINIMED INFUSION SET-MMT 399) MISC 1 Device by Does not apply route every 3 (three) days.    . Insulin Infusion Pump Supplies (PARADIGM RESERVOIR 3ML) MISC 1 Device by Does not apply route every 3 (three) days.    Marland Kitchen levothyroxine (SYNTHROID, LEVOTHROID) 112 MCG tablet Take 1 tablet (112 mcg total) by mouth daily before breakfast. 90 tablet 3  . mometasone (NASONEX) 50 MCG/ACT nasal spray Place 2 sprays into the nose daily.    . montelukast (SINGULAIR) 10 MG tablet Take 1 tablet (10 mg total) by mouth at bedtime. 90 tablet 3  . valsartan (DIOVAN) 40 MG tablet Take 1 tablet (40 mg total) by mouth daily. (Patient not taking: Reported on 10/08/2014) 30 tablet 11   No  current facility-administered medications on file prior to visit.    Allergies  Allergen Reactions  . Ciprofloxin Hcl [Ciprofloxacin]   . Food Color Red [Red Dye]   . Penicillins Hives    Family History  Problem Relation Age of Onset  . Coronary artery disease Father   . Cancer Mother     T cell lymphoma    BP 122/84 mmHg  Pulse 100  Temp(Src) 99.3 F (37.4 C) (Oral)  Ht 5\' 5"  (1.651 m)  Wt 162 lb (73.483 kg)  BMI 26.96 kg/m2  SpO2 95%  Review of Systems Denies LOC    Objective:   Physical Exam VITAL SIGNS:  See vs page GENERAL: no distress Pulses: dorsalis pedis intact bilat.   MSK: no deformity of the feet CV: no leg edema Skin:  no ulcer on the feet.  normal color and temp on the feet. Neuro: sensation is intact to touch  on the feet   A1c=7.7%     Assessment & Plan:  DM: she needs increased rx, if it can be done with a regimen that avoids or minimizes hypoglycemia. Noncompliance with pump settings: I'll work around this as best I can.  Patient is advised the following: Patient Instructions  check your blood sugar 8 times a day.  vary the time of day when you check, between before the 3 meals, and at bedtime.  also check if you have symptoms of your blood sugar being too high or too low.  please keep a record of the readings and bring it to your next appointment here.  please call us sooner if your blood sugar goes below 70, or if you have a lot of readings over 200.   reduce basal rate to 0.5 units/hr, 24 hrs/day.   Please increase your mealtime bolus to 1 unit/ 10 grams carbohydrate.  However, take 2 extra units to your calculated breakfast bolus.   continue correction bolus (which some people call "sensitivity," or "insulin sensitivity ratio," or just "isr") of 1 unit for each 50 by which your glucose exceeds 100.  Please come back for a follow-up appointment in 3 months.

## 2015-03-10 NOTE — Patient Instructions (Addendum)
check your blood sugar 8 times a day.  vary the time of day when you check, between before the 3 meals, and at bedtime.  also check if you have symptoms of your blood sugar being too high or too low.  please keep a record of the readings and bring it to your next appointment here.  please call us sooner if your blood sugar goes below 70, or if you have a lot of readings over 200.   reduce basal rate to 0.5 units/hr, 24 hrs/day.   Please increase your mealtime bolus to 1 unit/ 10 grams carbohydrate.  However, take 2 extra units to your calculated breakfast bolus.   continue correction bolus (which some people call "sensitivity," or "insulin sensitivity ratio," or just "isr") of 1 unit for each 50 by which your glucose exceeds 100.  Please come back for a follow-up appointment in 3 months.

## 2015-05-12 ENCOUNTER — Other Ambulatory Visit: Payer: Self-pay

## 2015-05-12 MED ORDER — INSULIN LISPRO 100 UNIT/ML ~~LOC~~ SOLN: SUBCUTANEOUS | Status: DC

## 2015-06-07 ENCOUNTER — Ambulatory Visit: Payer: 59 | Admitting: Endocrinology

## 2015-06-23 ENCOUNTER — Ambulatory Visit (INDEPENDENT_AMBULATORY_CARE_PROVIDER_SITE_OTHER): Payer: 59 | Admitting: Endocrinology

## 2015-06-23 ENCOUNTER — Encounter: Payer: Self-pay | Admitting: Endocrinology

## 2015-06-23 VITALS — BP 132/84 | HR 84 | Wt 166.0 lb

## 2015-06-23 DIAGNOSIS — E108 Type 1 diabetes mellitus with unspecified complications: Secondary | ICD-10-CM | POA: Diagnosis not present

## 2015-06-23 LAB — POCT GLYCOSYLATED HEMOGLOBIN (HGB A1C): HEMOGLOBIN A1C: 9.3

## 2015-06-23 NOTE — Progress Notes (Signed)
Subjective:    [name], female    DOB: 01-14-1971, 45 y.o.   MRN: [id number]  HPI Pt returns for f/u of diabetes mellitus:  DM type: 1  Dx'ed: Q000111Q Complications: none Therapy: insulin since dx. GDM: never DKA: never Severe hypoglycemia: never.  Pancreatitis: never Other: she has been on pump (medtronic) rx since 2014; she stopped continuous glucose monitor, due to cost.   Interval history:  She takes these pump settings:  3 different basal rates, varying from 0.6-0.65 units/hr.  mealtime bolus of 1 unit/12 grams carbohydrate.   correction bolus (which some people call "sensitivity," or "insulin sensitivity ratio," or just "isr") of 1 unit for each by 60 which your glucose exceeds 100.   She averages a total of approx 35 units per day.  She returned to her previous pump settings.  She says job-related anxiety is complicating the rx of her DM.  She says she is overeating when cbg goes low.  Meter is downloaded today, and the printout is scanned into the record.  It varies from 70-500.  There is no trend throughout the day.  Past Medical History  Diagnosis Date  . High cholesterol   . Diabetes mellitus   . Depression   . Hypothyroidism   . Anxiety     Past Surgical History  Procedure Laterality Date  . Appendectomy      Social History   Social History  . Marital Status: Divorced    Spouse Name: N/A  . Number of Children: N/A  . Years of Education: N/A   Occupational History  . Not on file.   Social History Main Topics  . Smoking status: Never Smoker   . Smokeless tobacco: Not on file  . Alcohol Use: Yes     Comment: OCCASIONAL  . Drug Use: No  . Sexual Activity: Yes    Birth Control/ Protection: IUD   Other Topics Concern  . Not on file   Social History Narrative    Current Outpatient Prescriptions on File Prior to Visit  Medication Sig Dispense Refill  . atorvastatin (LIPITOR) 10 MG tablet Take 1 tablet (10 mg total) by mouth  daily. 90 tablet 3  . buPROPion (WELLBUTRIN XL) 300 MG 24 hr tablet Take 1 tablet (300 mg total) by mouth every morning. 90 tablet 3  . Continuous Glucose Monitor Sup (ENLITE GLUCOSE SENSOR) MISC 1 Device by Does not apply route once a week. 13 each 3  . FLUoxetine (PROZAC) 10 MG tablet Take 1-2 tablets (10-20 mg total) by mouth daily. 180 tablet 3  . glucose blood (BAYER CONTOUR NEXT TEST) test strip Test blood glucose 2-3 times a day. Dx code: 250.01 200 each 10  . Insulin Infusion Pump Supplies (MINIMED INFUSION SET-MMT 399) MISC 1 Device by Does not apply route every 3 (three) days.    . Insulin Infusion Pump Supplies (PARADIGM RESERVOIR 3ML) MISC 1 Device by Does not apply route every 3 (three) days.    . insulin lispro (HUMALOG) 100 UNIT/ML injection 35 units per insulin pump daily. 10 mL 4  . levothyroxine (SYNTHROID, LEVOTHROID) 112 MCG tablet Take 1 tablet (112 mcg total) by mouth daily before breakfast. 90 tablet 3  . mometasone (NASONEX) 50 MCG/ACT nasal spray Place 2 sprays into the nose daily.    . montelukast (SINGULAIR) 10 MG tablet Take 1 tablet (10 mg total) by mouth at bedtime. 90 tablet 3  . valsartan (DIOVAN) 40 MG tablet Take 1 tablet (  40 mg total) by mouth daily. (Patient not taking: Reported on 10/08/2014) 30 tablet 11   No current facility-administered medications on file prior to visit.    Allergies  Allergen Reactions  . Ciprofloxin Hcl [Ciprofloxacin]   . Food Color Red [Red Dye]   . Penicillins Hives    Family History  Problem Relation Age of Onset  . Coronary artery disease Father   . Cancer Mother     T cell lymphoma    BP 132/84 mmHg  Pulse 84  Wt 166 lb (75.297 kg)  SpO2 98%  Review of Systems She denies LOC.     Objective:   Physical Exam VITAL SIGNS:  See vs page GENERAL: no distress Pulses: dorsalis pedis intact bilat.   MSK: no deformity of the feet CV: no leg edema Skin:  no ulcer on the feet.  normal color and temp on the feet. Neuro:  sensation is intact to touch on the feet.   A1c=9.3%    Assessment & Plan:  Type 1 DM: worse Anxiety: this is compromising the rx of her DM.  Patient is advised the following: Patient Instructions  check your blood sugar 8 times a day.  vary the time of day when you check, between before the 3 meals, and at bedtime.  also check if you have symptoms of your blood sugar being too high or too low.  please keep a record of the readings and bring it to your next appointment here.  please call us sooner if your blood sugar goes below 70, or if you have a lot of readings over 200.   increase basal rate to 1 unit/hr, 24 hrs/day.   Please increase your mealtime bolus to 1 unit/20 grams carbohydrate.   continue correction bolus (which some people call "sensitivity," or "insulin sensitivity ratio," or just "isr") of 1 unit for each 50 by which your glucose exceeds 100.  Please continue to work with Dr Sharlet Salina on the anxiety symptoms--doing so will help your diabetes.  Please come back for a follow-up appointment in 3 months.       Renato Shin, MD

## 2015-06-23 NOTE — Patient Instructions (Addendum)
check your blood sugar 8 times a day.  vary the time of day when you check, between before the 3 meals, and at bedtime.  also check if you have symptoms of your blood sugar being too high or too low.  please keep a record of the readings and bring it to your next appointment here.  please call us sooner if your blood sugar goes below 70, or if you have a lot of readings over 200.   increase basal rate to 1 unit/hr, 24 hrs/day.   Please increase your mealtime bolus to 1 unit/20 grams carbohydrate.   continue correction bolus (which some people call "sensitivity," or "insulin sensitivity ratio," or just "isr") of 1 unit for each 50 by which your glucose exceeds 100.  Please continue to work with Dr Sharlet Salina on the anxiety symptoms--doing so will help your diabetes.  Please come back for a follow-up appointment in 3 months.

## 2015-08-07 ENCOUNTER — Ambulatory Visit (INDEPENDENT_AMBULATORY_CARE_PROVIDER_SITE_OTHER): Payer: 59 | Admitting: Family Medicine

## 2015-08-07 ENCOUNTER — Encounter: Payer: Self-pay | Admitting: Family Medicine

## 2015-08-07 VITALS — BP 112/76 | HR 84 | Temp 99.9°F | Resp 14 | Wt 164.8 lb

## 2015-08-07 DIAGNOSIS — E108 Type 1 diabetes mellitus with unspecified complications: Secondary | ICD-10-CM

## 2015-08-07 DIAGNOSIS — J019 Acute sinusitis, unspecified: Secondary | ICD-10-CM | POA: Insufficient documentation

## 2015-08-07 DIAGNOSIS — E860 Dehydration: Secondary | ICD-10-CM | POA: Diagnosis not present

## 2015-08-07 DIAGNOSIS — T7840XA Allergy, unspecified, initial encounter: Secondary | ICD-10-CM | POA: Diagnosis not present

## 2015-08-07 LAB — GLUCOSE, POCT (MANUAL RESULT ENTRY): POC GLUCOSE: 89 mg/dL (ref 70–99)

## 2015-08-07 MED ORDER — CEFDINIR 300 MG PO CAPS: 300.0000 mg | ORAL_CAPSULE | Freq: Two times a day (BID) | ORAL | Status: AC

## 2015-08-07 NOTE — Progress Notes (Signed)
[name], female   DOB: 1970/02/25, 45 y.o.   MRN: [id number]   Subjective:    [name], female    DOB: [birth year], 45 y.o.   MRN: [id number]  Chief Complaint  Patient presents with  . Sinusitis  . Facial Pain    HPI Patient is in today for evaluation of worsening upper respiratory symptoms. She is endorsing fevers, chills, malaise and myalgias. Worsening fatigue and congestion over past week. Has been using Mucinex with temporary relief. Has had symptoms for roughly 3 weeks but they are worsening. Denies CP/palp/SOB/GI or GU c/o. Taking meds as prescribed  Past Medical History  Diagnosis Date  . High cholesterol   . Diabetes mellitus   . Depression   . Hypothyroidism   . Anxiety   . Acute sinusitis 08/07/2015  . Allergic state 08/07/2015    Past Surgical History  Procedure Laterality Date  . Appendectomy      Family History  Problem Relation Age of Onset  . Coronary artery disease Father   . Cancer Mother     T cell lymphoma    Social History   Social History  . Marital Status: Divorced    Spouse Name: N/A  . Number of Children: N/A  . Years of Education: N/A   Occupational History  . Not on file.   Social History Main Topics  . Smoking status: Never Smoker   . Smokeless tobacco: Not on file  . Alcohol Use: No  . Drug Use: No  . Sexual Activity: Yes    Birth Control/ Protection: IUD   Other Topics Concern  . Not on file   Social History Narrative    Outpatient Prescriptions Prior to Visit  Medication Sig Dispense Refill  . atorvastatin (LIPITOR) 10 MG tablet Take 1 tablet (10 mg total) by mouth daily. 90 tablet 3  . buPROPion (WELLBUTRIN XL) 300 MG 24 hr tablet Take 1 tablet (300 mg total) by mouth every morning. 90 tablet 3  . Continuous Glucose Monitor Sup (ENLITE GLUCOSE SENSOR) MISC 1 Device by Does not apply route once a week. 13 each 3  . FLUoxetine (PROZAC) 10 MG tablet Take 1-2 tablets (10-20 mg total)  by mouth daily. 180 tablet 3  . glucose blood (BAYER CONTOUR NEXT TEST) test strip Test blood glucose 2-3 times a day. Dx code: 250.01 200 each 10  . Insulin Infusion Pump Supplies (MINIMED INFUSION SET-MMT 399) MISC 1 Device by Does not apply route every 3 (three) days.    . Insulin Infusion Pump Supplies (PARADIGM RESERVOIR 3ML) MISC 1 Device by Does not apply route every 3 (three) days.    . insulin lispro (HUMALOG) 100 UNIT/ML injection 35 units per insulin pump daily. 10 mL 4  . levothyroxine (SYNTHROID, LEVOTHROID) 112 MCG tablet Take 1 tablet (112 mcg total) by mouth daily before breakfast. 90 tablet 3  . mometasone (NASONEX) 50 MCG/ACT nasal spray Place 2 sprays into the nose daily.    . montelukast (SINGULAIR) 10 MG tablet Take 1 tablet (10 mg total) by mouth at bedtime. 90 tablet 3  . valsartan (DIOVAN) 40 MG tablet Take 1 tablet (40 mg total) by mouth daily. 30 tablet 11   No facility-administered medications prior to visit.    Allergies  Allergen Reactions  . Ciprofloxin Hcl [Ciprofloxacin]   . Food Color Red [Red Dye]   . Penicillins Hives    Review of Systems  Constitutional: Positive for malaise/fatigue. Negative for  fever.  HENT: Positive for congestion. Negative for sore throat.   Eyes: Negative for blurred vision.  Respiratory: Positive for cough, sputum production and wheezing. Negative for shortness of breath.   Cardiovascular: Negative for chest pain, palpitations and leg swelling.  Gastrointestinal: Negative for nausea, abdominal pain and blood in stool.  Genitourinary: Negative for dysuria and frequency.  Musculoskeletal: Negative for falls.  Skin: Negative for rash.  Neurological: Positive for dizziness and weakness. Negative for loss of consciousness and headaches.  Endo/Heme/Allergies: Negative for environmental allergies.  Psychiatric/Behavioral: Negative for depression. The patient is not nervous/anxious.        Objective:    Physical Exam    Constitutional: She is oriented to person, place, and time. She appears well-developed and well-nourished. No distress.  HENT:  Head: Normocephalic and atraumatic.  Right Ear: External ear normal.  Left Ear: External ear normal.  Nose: Nose normal.  Dry mucus membranes.   TM mildly erythemaous on right  Eyes: Right eye exhibits no discharge. Left eye exhibits no discharge.  Neck: Normal range of motion. Neck supple.  Cardiovascular: Normal rate and regular rhythm.   No murmur heard. Pulmonary/Chest: Effort normal and breath sounds normal.  Abdominal: Soft. Bowel sounds are normal. There is no tenderness.  Musculoskeletal: She exhibits no edema.  Neurological: She is alert and oriented to person, place, and time.  Skin: Skin is warm and dry.  Psychiatric: She has a normal mood and affect.  Nursing note and vitals reviewed.   BP 112/76 mmHg  Pulse 84  Temp(Src) 99.9 F (37.7 C) (Oral)  Resp 14  Wt 164 lb 12 oz (74.73 kg)  SpO2 98%  LMP 07/10/2015 Wt Readings from Last 3 Encounters:  08/07/15 164 lb 12 oz (74.73 kg)  06/23/15 166 lb (75.297 kg)  03/10/15 162 lb (73.483 kg)     Lab Results  Component Value Date   WBC 12.2* 12/05/2010   HGB 13.9 12/05/2010   HCT 38.5 12/05/2010   PLT 367 12/05/2010   GLUCOSE 210* 10/08/2014   CHOL 180 10/08/2014   TRIG 92.0 10/08/2014   HDL 48.30 10/08/2014   LDLCALC 113* 10/08/2014   ALT 22 12/05/2010   AST 30 12/05/2010   NA 136 10/08/2014   K 5.1 10/08/2014   CL 102 10/08/2014   CREATININE 0.81 10/08/2014   BUN 12 10/08/2014   CO2 28 10/08/2014   TSH 0.95 10/08/2014   HGBA1C 9.3 06/23/2015   MICROALBUR <0.7 10/08/2014    Lab Results  Component Value Date   TSH 0.95 10/08/2014   Lab Results  Component Value Date   WBC 12.2* 12/05/2010   HGB 13.9 12/05/2010   HCT 38.5 12/05/2010   MCV 87.5 12/05/2010   PLT 367 12/05/2010   Lab Results  Component Value Date   NA 136 10/08/2014   K 5.1 10/08/2014   CO2 28  10/08/2014   GLUCOSE 210* 10/08/2014   BUN 12 10/08/2014   CREATININE 0.81 10/08/2014   BILITOT 0.6 12/05/2010   ALKPHOS 72 12/05/2010   AST 30 12/05/2010   ALT 22 12/05/2010   PROT 8.2 12/05/2010   ALBUMIN 4.4 12/05/2010   CALCIUM 9.7 10/08/2014   GFR 80.99 10/08/2014   Lab Results  Component Value Date   CHOL 180 10/08/2014   Lab Results  Component Value Date   HDL 48.30 10/08/2014   Lab Results  Component Value Date   LDLCALC 113* 10/08/2014   Lab Results  Component Value Date   TRIG  92.0 10/08/2014   Lab Results  Component Value Date   CHOLHDL 4 10/08/2014   Lab Results  Component Value Date   HGBA1C 9.3 06/23/2015       Assessment & Plan:   Problem List Items Addressed This Visit    Type 1 diabetes mellitus ([place])    Feels a little woozey and sugar has dropped from 108 to 89 given a cranberry juice cocktail and she has glucose she will take as well      Relevant Orders   POCT Glucose (CBG) (Completed)   Acute sinusitis - Primary    Cefdinir 300 mg po bid x 10 days, probiotics and mucinex bid. Also clinically dehydrated and feeling weak, encouraged increased hydration and rest over next 24 hours.       Relevant Medications   cefdinir (OMNICEF) 300 MG capsule   Allergic state    Uses her Zyrtec bid, Singulair daily, nasonex daily, also       Dehydration    Dry mucus membranes and light headed. enocuraged to increase hydration significantly for next 24 hours and increase rest.          I am having Ms. Ervine start on cefdinir. I am also having her maintain her glucose blood, PARADIGM RESERVOIR 3ML, MINIMED INFUSION SET-MMT 399, valsartan, ENLITE GLUCOSE SENSOR, montelukast, levothyroxine, FLUoxetine, buPROPion, atorvastatin, mometasone, and insulin lispro.  Meds ordered this encounter  Medications  . cefdinir (OMNICEF) 300 MG capsule    Sig: Take 1 capsule (300 mg total) by mouth 2 (two) times daily.    Dispense:  20 capsule    Refill:  0       Penni Homans, MD

## 2015-08-07 NOTE — Progress Notes (Signed)
Pre visit review using our clinic review tool, if applicable. No additional management support is needed unless otherwise documented below in the visit note. 

## 2015-08-07 NOTE — Assessment & Plan Note (Addendum)
Cefdinir 300 mg po bid x 10 days, probiotics and mucinex bid. Also clinically dehydrated and feeling weak, encouraged increased hydration and rest over next 24 hours.

## 2015-08-07 NOTE — Assessment & Plan Note (Signed)
Uses her Zyrtec bid, Singulair daily, nasonex daily, also

## 2015-08-07 NOTE — Assessment & Plan Note (Signed)
Dry mucus membranes and light headed. enocuraged to increase hydration significantly for next 24 hours and increase rest.

## 2015-08-07 NOTE — Patient Instructions (Signed)
[employer] 10 strain probiotics, 1 cap daily  Sinusitis, Adult Sinusitis is redness, soreness, and inflammation of the paranasal sinuses. Paranasal sinuses are air pockets within the bones of your face. They are located beneath your eyes, in the middle of your forehead, and above your eyes. In healthy paranasal sinuses, mucus is able to drain out, and air is able to circulate through them by way of your nose. However, when your paranasal sinuses are inflamed, mucus and air can become trapped. This can allow bacteria and other germs to grow and cause infection. Sinusitis can develop quickly and last only a short time (acute) or continue over a long period (chronic). Sinusitis that lasts for more than 12 weeks is considered chronic. CAUSES Causes of sinusitis include:  Allergies.  Structural abnormalities, such as displacement of the cartilage that separates your nostrils (deviated septum), which can decrease the air flow through your nose and sinuses and affect sinus drainage.  Functional abnormalities, such as when the small hairs (cilia) that line your sinuses and help remove mucus do not work properly or are not present. SIGNS AND SYMPTOMS Symptoms of acute and chronic sinusitis are the same. The primary symptoms are pain and pressure around the affected sinuses. Other symptoms include:  Upper toothache.  Earache.  Headache.  Bad breath.  Decreased sense of smell and taste.  A cough, which worsens when you are lying flat.  Fatigue.  Fever.  Thick drainage from your nose, which often is green and may contain pus (purulent).  Swelling and warmth over the affected sinuses. DIAGNOSIS Your health care provider will perform a physical exam. During your exam, your health care provider may perform any of the following to help determine if you have acute sinusitis or chronic sinusitis:  Look in your nose for signs of abnormal growths in your nostrils (nasal polyps).  Tap over the  affected sinus to check for signs of infection.  View the inside of your sinuses using an imaging device that has a light attached (endoscope). If your health care provider suspects that you have chronic sinusitis, one or more of the following tests may be recommended:  Allergy tests.  Nasal culture. A sample of mucus is taken from your nose, sent to a lab, and screened for bacteria.  Nasal cytology. A sample of mucus is taken from your nose and examined by your health care provider to determine if your sinusitis is related to an allergy. TREATMENT Most cases of acute sinusitis are related to a viral infection and will resolve on their own within 10 days. Sometimes, medicines are prescribed to help relieve symptoms of both acute and chronic sinusitis. These may include pain medicines, decongestants, nasal steroid sprays, or saline sprays. However, for sinusitis related to a bacterial infection, your health care provider will prescribe antibiotic medicines. These are medicines that will help kill the bacteria causing the infection. Rarely, sinusitis is caused by a fungal infection. In these cases, your health care provider will prescribe antifungal medicine. For some cases of chronic sinusitis, surgery is needed. Generally, these are cases in which sinusitis recurs more than 3 times per year, despite other treatments. HOME CARE INSTRUCTIONS  Drink plenty of water. Water helps thin the mucus so your sinuses can drain more easily.  Use a humidifier.  Inhale steam 3-4 times a day (for example, sit in the bathroom with the shower running).  Apply a warm, moist washcloth to your face 3-4 times a day, or as directed by your health  care provider.  Use saline nasal sprays to help moisten and clean your sinuses.  Take medicines only as directed by your health care provider.  If you were prescribed either an antibiotic or antifungal medicine, finish it all even if you start to feel better. SEEK  IMMEDIATE MEDICAL CARE IF:  You have increasing pain or severe headaches.  You have nausea, vomiting, or drowsiness.  You have swelling around your face.  You have vision problems.  You have a stiff neck.  You have difficulty breathing.   This information is not intended to replace advice given to you by your health care provider. Make sure you discuss any questions you have with your health care provider.   Document Released: 01/09/2005 Document Revised: 01/30/2014 Document Reviewed: 01/24/2011 Elsevier Interactive Patient Education [employer].

## 2015-08-07 NOTE — Assessment & Plan Note (Addendum)
Feels a little woozey and sugar has dropped from 108 to 89 given a cranberry juice cocktail and she has glucose she will take as well

## 2015-09-01 ENCOUNTER — Ambulatory Visit (INDEPENDENT_AMBULATORY_CARE_PROVIDER_SITE_OTHER): Payer: 59 | Admitting: Internal Medicine

## 2015-09-01 ENCOUNTER — Encounter: Payer: Self-pay | Admitting: Internal Medicine

## 2015-09-01 VITALS — BP 114/82 | HR 77 | Temp 98.3°F | Resp 16 | Ht 65.0 in | Wt 168.8 lb

## 2015-09-01 DIAGNOSIS — Z0000 Encounter for general adult medical examination without abnormal findings: Secondary | ICD-10-CM | POA: Diagnosis not present

## 2015-09-01 DIAGNOSIS — E039 Hypothyroidism, unspecified: Secondary | ICD-10-CM

## 2015-09-01 DIAGNOSIS — Z23 Encounter for immunization: Secondary | ICD-10-CM | POA: Diagnosis not present

## 2015-09-01 DIAGNOSIS — F32A Depression, unspecified: Secondary | ICD-10-CM

## 2015-09-01 DIAGNOSIS — F329 Major depressive disorder, single episode, unspecified: Secondary | ICD-10-CM

## 2015-09-01 DIAGNOSIS — E785 Hyperlipidemia, unspecified: Secondary | ICD-10-CM | POA: Diagnosis not present

## 2015-09-01 MED ORDER — MELOXICAM 15 MG PO TABS: 15.0000 mg | ORAL_TABLET | Freq: Every day | ORAL | 6 refills | Status: DC

## 2015-09-01 NOTE — Patient Instructions (Signed)
We have sent in the mobic (meloxicam) to the pharmacy. You can take it nightly and can use 1/2 at night time if you are not having much pain.   If you are still having problems with the mood send [name] a message on mychart and we can adjust your medicines if needed.   Work on not using the phone while driving.   Health Maintenance, Female Adopting a healthy lifestyle and getting preventive care can go a long way to promote health and wellness. Talk with your health care provider about what schedule of regular examinations is right for you. This is a good chance for you to check in with your provider about disease prevention and staying healthy. In between checkups, there are plenty of things you can do on your own. Experts have done a lot of research about which lifestyle changes and preventive measures are most likely to keep you healthy. Ask your health care provider for more information. WEIGHT AND DIET  Eat a healthy diet  Be sure to include plenty of vegetables, fruits, low-fat dairy products, and lean protein.  Do not eat a lot of foods high in solid fats, added sugars, or salt.  Get regular exercise. This is one of the most important things you can do for your health.  Most adults should exercise for at least 150 minutes each week. The exercise should increase your heart rate and make you sweat (moderate-intensity exercise).  Most adults should also do strengthening exercises at least twice a week. This is in addition to the moderate-intensity exercise.  Maintain a healthy weight  Body mass index (BMI) is a measurement that can be used to identify possible weight problems. It estimates body fat based on height and weight. Your health care provider can help determine your BMI and help you achieve or maintain a healthy weight.  For females 93 years of age and older:   A BMI below 18.5 is considered underweight.  A BMI of 18.5 to 24.9 is normal.  A BMI of 25 to 29.9 is considered  overweight.  A BMI of 30 and above is considered obese.  Watch levels of cholesterol and blood lipids  You should start having your blood tested for lipids and cholesterol at 47 years of age, then have this test every 5 years.  You may need to have your cholesterol levels checked more often if:  Your lipid or cholesterol levels are high.  You are older than 47 years of age.  You are at high risk for heart disease.  CANCER SCREENING   Lung Cancer  Lung cancer screening is recommended for adults 30-106 years old who are at high risk for lung cancer because of a history of smoking.  A yearly low-dose CT scan of the lungs is recommended for people who:  Currently smoke.  Have quit within the past 15 years.  Have at least a 30-pack-year history of smoking. A pack year is smoking an average of one pack of cigarettes a day for 1 year.  Yearly screening should continue until it has been 15 years since you quit.  Yearly screening should stop if you develop a health problem that would prevent you from having lung cancer treatment.  Breast Cancer  Practice breast self-awareness. This means understanding how your breasts normally appear and feel.  It also means doing regular breast self-exams. Let your health care provider know about any changes, no matter how small.  If you are in your 20s or 30s,  you should have a clinical breast exam (CBE) by a health care provider every 1-3 years as part of a regular health exam.  If you are 26 or older, have a CBE every year. Also consider having a breast X-ray (mammogram) every year.  If you have a family history of breast cancer, talk to your health care provider about genetic screening.  If you are at high risk for breast cancer, talk to your health care provider about having an MRI and a mammogram every year.  Breast cancer gene (BRCA) assessment is recommended for women who have family members with BRCA-related cancers. BRCA-related  cancers include:  Breast.  Ovarian.  Tubal.  Peritoneal cancers.  Results of the assessment will determine the need for genetic counseling and BRCA1 and BRCA2 testing. Cervical Cancer Your health care provider may recommend that you be screened regularly for cancer of the pelvic organs (ovaries, uterus, and vagina). This screening involves a pelvic examination, including checking for microscopic changes to the surface of your cervix (Pap test). You may be encouraged to have this screening done every 3 years, beginning at age 30.  For women ages 25-65, health care providers may recommend pelvic exams and Pap testing every 3 years, or they may recommend the Pap and pelvic exam, combined with testing for human papilloma virus (HPV), every 5 years. Some types of HPV increase your risk of cervical cancer. Testing for HPV may also be done on women of any age with unclear Pap test results.  Other health care providers may not recommend any screening for nonpregnant women who are considered low risk for pelvic cancer and who do not have symptoms. Ask your health care provider if a screening pelvic exam is right for you.  If you have had past treatment for cervical cancer or a condition that could lead to cancer, you need Pap tests and screening for cancer for at least 20 years after your treatment. If Pap tests have been discontinued, your risk factors (such as having a new sexual partner) need to be reassessed to determine if screening should resume. Some women have medical problems that increase the chance of getting cervical cancer. In these cases, your health care provider may recommend more frequent screening and Pap tests. Colorectal Cancer  This type of cancer can be detected and often prevented.  Routine colorectal cancer screening usually begins at 47 years of age and continues through 48 years of age.  Your health care provider may recommend screening at an earlier age if you have risk  factors for colon cancer.  Your health care provider may also recommend using home test kits to check for hidden blood in the stool.  A small camera at the end of a tube can be used to examine your colon directly (sigmoidoscopy or colonoscopy). This is done to check for the earliest forms of colorectal cancer.  Routine screening usually begins at age 56.  Direct examination of the colon should be repeated every 5-10 years through 47 years of age. However, you may need to be screened more often if early forms of precancerous polyps or small growths are found. Skin Cancer  Check your skin from head to toe regularly.  Tell your health care provider about any new moles or changes in moles, especially if there is a change in a mole's shape or color.  Also tell your health care provider if you have a mole that is larger than the size of a pencil eraser.  Always use  sunscreen. Apply sunscreen liberally and repeatedly throughout the day.  Protect yourself by wearing long sleeves, pants, a wide-brimmed hat, and sunglasses whenever you are outside. HEART DISEASE, DIABETES, AND HIGH BLOOD PRESSURE   High blood pressure causes heart disease and increases the risk of stroke. High blood pressure is more likely to develop in:  People who have blood pressure in the high end of the normal range (130-139/85-89 mm Hg).  People who are overweight or obese.  People who are African American.  If you are 63-38 years of age, have your blood pressure checked every 3-5 years. If you are 28 years of age or older, have your blood pressure checked every year. You should have your blood pressure measured twice--once when you are at a hospital or clinic, and once when you are not at a hospital or clinic. Record the average of the two measurements. To check your blood pressure when you are not at a hospital or clinic, you can use:  An automated blood pressure machine at a pharmacy.  A home blood pressure  monitor.  If you are between 53 years and 76 years old, ask your health care provider if you should take aspirin to prevent strokes.  Have regular diabetes screenings. This involves taking a blood sample to check your fasting blood sugar level.  If you are at a normal weight and have a low risk for diabetes, have this test once every three years after 46 years of age.  If you are overweight and have a high risk for diabetes, consider being tested at a younger age or more often. PREVENTING INFECTION  Hepatitis B  If you have a higher risk for hepatitis B, you should be screened for this virus. You are considered at high risk for hepatitis B if:  You were born in a country where hepatitis B is common. Ask your health care provider which countries are considered high risk.  Your parents were born in a high-risk country, and you have not been immunized against hepatitis B (hepatitis B vaccine).  You have HIV or AIDS.  You use needles to inject street drugs.  You live with someone who has hepatitis B.  You have had sex with someone who has hepatitis B.  You get hemodialysis treatment.  You take certain medicines for conditions, including cancer, organ transplantation, and autoimmune conditions. Hepatitis C  Blood testing is recommended for:  Everyone born from [birth year] through 1967.  Anyone with known risk factors for hepatitis C. Sexually transmitted infections (STIs)  You should be screened for sexually transmitted infections (STIs) including gonorrhea and chlamydia if:  You are sexually active and are younger than 47 years of age.  You are older than 47 years of age and your health care provider tells you that you are at risk for this type of infection.  Your sexual activity has changed since you were last screened and you are at an increased risk for chlamydia or gonorrhea. Ask your health care provider if you are at risk.  If you do not have HIV, but are at risk, it may be  recommended that you take a prescription medicine daily to prevent HIV infection. This is called pre-exposure prophylaxis (PrEP). You are considered at risk if:  You are sexually active and do not regularly use condoms or know the HIV status of your partner(s).  You take drugs by injection.  You are sexually active with a partner who has HIV. Talk with your health care provider  about whether you are at high risk of being infected with HIV. If you choose to begin PrEP, you should first be tested for HIV. You should then be tested every 3 months for as long as you are taking PrEP.  PREGNANCY   If you are premenopausal and you may become pregnant, ask your health care provider about preconception counseling.  If you may become pregnant, take 400 to 800 micrograms (mcg) of folic acid every day.  If you want to prevent pregnancy, talk to your health care provider about birth control (contraception). OSTEOPOROSIS AND MENOPAUSE   Osteoporosis is a disease in which the bones lose minerals and strength with aging. This can result in serious bone fractures. Your risk for osteoporosis can be identified using a bone density scan.  If you are 36 years of age or older, or if you are at risk for osteoporosis and fractures, ask your health care provider if you should be screened.  Ask your health care provider whether you should take a calcium or vitamin D supplement to lower your risk for osteoporosis.  Menopause may have certain physical symptoms and risks.  Hormone replacement therapy may reduce some of these symptoms and risks. Talk to your health care provider about whether hormone replacement therapy is right for you.  HOME CARE INSTRUCTIONS   Schedule regular health, dental, and eye exams.  Stay current with your immunizations.   Do not use any tobacco products including cigarettes, chewing tobacco, or electronic cigarettes.  If you are pregnant, do not drink alcohol.  If you are  breastfeeding, limit how much and how often you drink alcohol.  Limit alcohol intake to no more than 1 drink per day for nonpregnant women. One drink equals 12 ounces of beer, 5 ounces of wine, or 1 ounces of hard liquor.  Do not use street drugs.  Do not share needles.  Ask your health care provider for help if you need support or information about quitting drugs.  Tell your health care provider if you often feel depressed.  Tell your health care provider if you have ever been abused or do not feel safe at home.   This information is not intended to replace advice given to you by your health care provider. Make sure you discuss any questions you have with your health care provider.   Document Released: 07/25/2010 Document Revised: 01/30/2014 Document Reviewed: 12/11/2012 Elsevier Interactive Patient Education [employer].

## 2015-09-01 NOTE — Progress Notes (Signed)
   Subjective:    [name], female    DOB: [birth year], 45 y.o.   MRN: [id number]  HPI The patient is a 45 YO female coming in for wellness. Sleeping a little worse lately due to some joint pains. This is causing her mood to be slightly more volatile during the day. She has increased her prozac to 20 mg daily from 10 mg.   PMH, [facility], social history reviewed and updated.   Review of Systems  Constitutional: Negative for activity change, appetite change, fatigue, fever and unexpected weight change.  HENT: Negative.   Eyes: Negative.   Respiratory: Negative for cough, chest tightness, shortness of breath and wheezing.   Cardiovascular: Negative for chest pain, palpitations and leg swelling.  Gastrointestinal: Negative for abdominal distention, abdominal pain, blood in stool, constipation, diarrhea and nausea.  Musculoskeletal: Negative for arthralgias, back pain, myalgias and neck pain.  Skin: Negative for color change, pallor, rash and wound.  Neurological: Negative.   Psychiatric/Behavioral: Negative.       Objective:   Physical Exam  Constitutional: She is oriented to person, place, and time. She appears well-developed and well-nourished.  HENT:  Head: Normocephalic and atraumatic.  Eyes: EOM are normal.  Neck: Normal range of motion.  Cardiovascular: Normal rate and regular rhythm.   No murmur heard. Pulmonary/Chest: Effort normal and breath sounds normal. No respiratory distress. She has no wheezes. She has no rales.  Abdominal: Soft. Bowel sounds are normal. She exhibits no distension. There is no tenderness. There is no rebound.  Musculoskeletal: She exhibits no edema.  Neurological: She is alert and oriented to person, place, and time. Coordination normal.  Skin: Skin is warm and dry.  Psychiatric: She has a normal mood and affect.   Vitals:   09/01/15 0932  BP: 114/82  Pulse: 77  Resp: 16  Temp: 98.3 F (36.8 C)  TempSrc: Oral  SpO2: 98%    Weight: 168 lb 12.8 oz (76.6 kg)  Height: 5\' 5"  (1.651 m)      Assessment & Plan:  Pneumonia 23 given at visit

## 2015-09-01 NOTE — Progress Notes (Signed)
Pre visit review using our clinic review tool, if applicable. No additional management support is needed unless otherwise documented below in the visit note. 

## 2015-09-02 ENCOUNTER — Encounter: Payer: Self-pay | Admitting: Internal Medicine

## 2015-09-02 DIAGNOSIS — Z0000 Encounter for general adult medical examination without abnormal findings: Secondary | ICD-10-CM | POA: Insufficient documentation

## 2015-09-02 NOTE — Assessment & Plan Note (Signed)
Wants lipid panel with labs with endo. Adjust as needed. Taking lipitor 10 mg daily.

## 2015-09-02 NOTE — Assessment & Plan Note (Signed)
She wishes to get labs with her endocrinologist in several weeks. Adjust synthroid as needed after labs.

## 2015-09-02 NOTE — Assessment & Plan Note (Signed)
Suspect some worsening from sleep loss and addressing.

## 2015-09-02 NOTE — Assessment & Plan Note (Addendum)
She reports she had colonoscopy recently, we do not have records. Given pneumonia 23 at visit. Reminded about yearly flu shot. Needs eye exam and pap smear and she knows she is overdue for these. Counseled about sun safety and the dangers of distracted driving. Given screening recommendations.

## 2015-09-23 ENCOUNTER — Encounter: Payer: Self-pay | Admitting: Endocrinology

## 2015-09-23 ENCOUNTER — Ambulatory Visit (INDEPENDENT_AMBULATORY_CARE_PROVIDER_SITE_OTHER): Payer: 59 | Admitting: Endocrinology

## 2015-09-23 VITALS — BP 132/80 | HR 64 | Ht 65.0 in | Wt 168.0 lb

## 2015-09-23 DIAGNOSIS — E108 Type 1 diabetes mellitus with unspecified complications: Secondary | ICD-10-CM | POA: Diagnosis not present

## 2015-09-23 LAB — POCT GLYCOSYLATED HEMOGLOBIN (HGB A1C): Hemoglobin A1C: 8.5

## 2015-09-23 NOTE — Patient Instructions (Addendum)
check your blood sugar 8 times a day.  vary the time of day when you check, between before the 3 meals, and at bedtime.  also check if you have symptoms of your blood sugar being too high or too low.  please keep a record of the readings and bring it to your next appointment here.  please call us sooner if your blood sugar goes below 70, or if you have a lot of readings over 200.   Please take these pump settings: increase basal rate of 0.9 units/hr, except 0.5 units/hr, 2 am-5 AM mealtime bolus of 1 unit/10 grams carbohydrate at breakfast and supper, and 1 unit/50 grams at lunch correction bolus (which some people call "sensitivity," or "insulin sensitivity ratio," or just "isr") of 1 unit for each 50 by which your glucose exceeds 100.  Please come back for a follow-up appointment in 2 months.

## 2015-09-23 NOTE — Progress Notes (Signed)
Subjective:    [name], female    DOB: Mar 15, 1970, 45 y.o.   MRN: [id number]  HPI Pt returns for f/u of diabetes mellitus:  DM type: 1  Dx'ed: [id number] Complications: none Therapy: insulin since dx. GDM: never DKA: never Severe hypoglycemia: never.  Pancreatitis: never Other: she has been on pump (medtronic) rx since 2014; she stopped continuous glucose monitor, due to cost.   Interval history:  She takes these pump settings:  Basal: 0.87 unit/HR, except 0.77 units/HR, 11 am-12 midnight.   mealtime bolus of 1 unit/20 grams carbohydrate.   correction bolus (which some people call "sensitivity," or "insulin sensitivity ratio," or just "isr") of 1 unit for each by 60 which your glucose exceeds 100.   She averages a total of approx 30 units per day.  Meter is downloaded today, and the printout is scanned into the record.  It varies from 50-550.  It is in general lowest fasting, and in the afternoon.  She has mild hypoglycemia almost daily, in the afternoon.  pt states she feels well in general.   Past Medical History:  Diagnosis Date  . Acute sinusitis 08/07/2015  . Allergic state 08/07/2015  . Anxiety   . Depression   . Diabetes mellitus   . High cholesterol   . Hypothyroidism     Past Surgical History:  Procedure Laterality Date  . APPENDECTOMY      Social History   Social History  . Marital status: Divorced    Spouse name: N/A  . Number of children: N/A  . Years of education: N/A   Occupational History  . Not on file.   Social History Main Topics  . Smoking status: Never Smoker  . Smokeless tobacco: Never Used  . Alcohol use No  . Drug use: No  . Sexual activity: Yes    Birth control/ protection: IUD   Other Topics Concern  . Not on file   Social History Narrative  . No narrative on file    Current Outpatient Prescriptions on File Prior to Visit  Medication Sig Dispense Refill  . atorvastatin (LIPITOR) 10 MG tablet Take 1 tablet (10  mg total) by mouth daily. 90 tablet 3  . buPROPion (WELLBUTRIN XL) 300 MG 24 hr tablet Take 1 tablet (300 mg total) by mouth every morning. 90 tablet 3  . Continuous Glucose Monitor Sup (ENLITE GLUCOSE SENSOR) MISC 1 Device by Does not apply route once a week. 13 each 3  . FLUoxetine (PROZAC) 10 MG tablet Take 1-2 tablets (10-20 mg total) by mouth daily. (Patient taking differently: Take 20 mg by mouth daily. ) 180 tablet 3  . glucose blood (BAYER CONTOUR NEXT TEST) test strip Test blood glucose 2-3 times a day. Dx code: 250.01 200 each 10  . Insulin Infusion Pump Supplies (MINIMED INFUSION SET-MMT 399) MISC 1 Device by Does not apply route every 3 (three) days.    . Insulin Infusion Pump Supplies (PARADIGM RESERVOIR 3ML) MISC 1 Device by Does not apply route every 3 (three) days.    . insulin lispro (HUMALOG) 100 UNIT/ML injection 35 units per insulin pump daily. 10 mL 4  . levothyroxine (SYNTHROID, LEVOTHROID) 112 MCG tablet Take 1 tablet (112 mcg total) by mouth daily before breakfast. 90 tablet 3  . meloxicam (MOBIC) 15 MG tablet Take 1 tablet (15 mg total) by mouth daily. 30 tablet 6  . mometasone (NASONEX) 50 MCG/ACT nasal spray Place 2 sprays into the nose daily.    Marland Kitchen  montelukast (SINGULAIR) 10 MG tablet Take 1 tablet (10 mg total) by mouth at bedtime. 90 tablet 3  . valsartan (DIOVAN) 40 MG tablet Take 1 tablet (40 mg total) by mouth daily. 30 tablet 11   No current facility-administered medications on file prior to visit.     Allergies  Allergen Reactions  . Ciprofloxin Hcl [Ciprofloxacin]   . Food Color Red [Red Dye]   . Penicillins Hives    Family History  Problem Relation Age of Onset  . Coronary artery disease Father   . Cancer Mother     T cell lymphoma    BP 132/80   Pulse 64   Ht 5\' 5"  (1.651 m)   Wt 168 lb (76.2 kg)   BMI 27.96 kg/m   Review of Systems Denies LOC.      Objective:   Physical Exam VITAL SIGNS:  See vs page GENERAL: no distress Pulses:  dorsalis pedis intact bilat.   MSK: no deformity of the feet CV: no leg edema Skin:  no ulcer on the feet.  normal color and temp on the feet. Neuro: sensation is intact to touch on the feet.    A1c=8.5%    Assessment & Plan:  Type 1 DM: Based on the pattern of her cbg's, she needs some adjustment in her therapy.  An option is to simplify pump regimen by emphasizing basal rate.  We discussed, and pt declines for now

## 2015-09-28 ENCOUNTER — Telehealth: Payer: Self-pay | Admitting: Nutrition

## 2015-09-28 NOTE — Telephone Encounter (Signed)
Contacted patient.  She is wearing a 500 series Medtronic pump.  She was told to call the 800 telephone number on the back of the pump and let them know she wants to upgrade to the 670G pump.  She was told that they will do an  insurance investigation and let her know how much it will cost her, and if she wants it, she is to let them know.  We will do the rest.  She agreed to do this.

## 2015-10-06 ENCOUNTER — Other Ambulatory Visit (HOSPITAL_COMMUNITY)
Admission: RE | Admit: 2015-10-06 | Discharge: 2015-10-06 | Disposition: A | Discharge status: Home / Self Care | Payer: 59 | Source: Ambulatory Visit | Attending: Obstetrics and Gynecology | Admitting: Obstetrics and Gynecology

## 2015-10-06 ENCOUNTER — Other Ambulatory Visit: Payer: Self-pay | Admitting: Obstetrics and Gynecology

## 2015-10-06 DIAGNOSIS — Z1151 Encounter for screening for human papillomavirus (HPV): Secondary | ICD-10-CM | POA: Diagnosis present

## 2015-10-06 DIAGNOSIS — Z01419 Encounter for gynecological examination (general) (routine) without abnormal findings: Secondary | ICD-10-CM | POA: Diagnosis present

## 2015-10-06 DIAGNOSIS — Z1231 Encounter for screening mammogram for malignant neoplasm of breast: Secondary | ICD-10-CM

## 2015-10-07 LAB — CYTOLOGY - PAP

## 2015-10-26 ENCOUNTER — Ambulatory Visit
Admission: RE | Admit: 2015-10-26 | Discharge: 2015-10-26 | Disposition: A | Discharge status: Home / Self Care | Payer: 59 | Source: Ambulatory Visit | Attending: Obstetrics and Gynecology | Admitting: Obstetrics and Gynecology

## 2015-10-26 DIAGNOSIS — Z1231 Encounter for screening mammogram for malignant neoplasm of breast: Secondary | ICD-10-CM

## 2015-11-11 ENCOUNTER — Telehealth: Payer: Self-pay | Admitting: Endocrinology

## 2015-11-23 ENCOUNTER — Ambulatory Visit (INDEPENDENT_AMBULATORY_CARE_PROVIDER_SITE_OTHER): Payer: 59 | Admitting: Endocrinology

## 2015-11-23 VITALS — BP 116/82 | HR 87 | Ht 65.0 in | Wt 160.0 lb

## 2015-11-23 DIAGNOSIS — Z23 Encounter for immunization: Secondary | ICD-10-CM | POA: Diagnosis not present

## 2015-11-23 DIAGNOSIS — R945 Abnormal results of liver function studies: Secondary | ICD-10-CM | POA: Insufficient documentation

## 2015-11-23 DIAGNOSIS — E108 Type 1 diabetes mellitus with unspecified complications: Secondary | ICD-10-CM

## 2015-11-23 DIAGNOSIS — R7989 Other specified abnormal findings of blood chemistry: Secondary | ICD-10-CM | POA: Diagnosis not present

## 2015-11-23 LAB — BASIC METABOLIC PANEL
BUN: 13 mg/dL (ref 6–23)
CHLORIDE: 103 meq/L (ref 96–112)
CO2: 27 meq/L (ref 19–32)
CREATININE: 0.93 mg/dL (ref 0.40–1.20)
Calcium: 10 mg/dL (ref 8.4–10.5)
GFR: 68.72 mL/min (ref >60.00)
Glucose, Bld: 125 mg/dL — ABNORMAL HIGH (ref 70–99)
POTASSIUM: 4.3 meq/L (ref 3.5–5.1)
Sodium: 139 mEq/L (ref 135–145)

## 2015-11-23 LAB — URINALYSIS, ROUTINE W REFLEX MICROSCOPIC
BILIRUBIN URINE: NEGATIVE
HGB URINE DIPSTICK: NEGATIVE
KETONES UR: NEGATIVE
LEUKOCYTES UA: NEGATIVE
NITRITE: NEGATIVE
PH: 6 (ref 5.0–8.0)
RBC / HPF: NONE SEEN (ref 0–?)
Specific Gravity, Urine: <=1.005 — AB (ref 1.000–1.030)
TOTAL PROTEIN, URINE-UPE24: NEGATIVE
UROBILINOGEN UA: 0.2 (ref 0.0–1.0)
Urine Glucose: NEGATIVE
WBC, UA: NONE SEEN (ref 0–?)

## 2015-11-23 LAB — HEPATIC FUNCTION PANEL
ALBUMIN: 4.5 g/dL (ref 3.5–5.2)
ALK PHOS: 85 U/L (ref 39–117)
ALT: 37 U/L — ABNORMAL HIGH (ref 0–35)
AST: 39 U/L — ABNORMAL HIGH (ref 0–37)
BILIRUBIN DIRECT: 0.1 mg/dL (ref 0.0–0.3)
TOTAL PROTEIN: 7.9 g/dL (ref 6.0–8.3)
Total Bilirubin: 0.6 mg/dL (ref 0.2–1.2)

## 2015-11-23 LAB — LIPID PANEL
CHOLESTEROL: 177 mg/dL (ref 0–200)
HDL: 66.3 mg/dL (ref >39.00)
LDL CALC: 91 mg/dL (ref 0–99)
NonHDL: 110.61
Total CHOL/HDL Ratio: 3
Triglycerides: 100 mg/dL (ref 0.0–149.0)
VLDL: 20 mg/dL (ref 0.0–40.0)

## 2015-11-23 LAB — MICROALBUMIN / CREATININE URINE RATIO
Creatinine,U: 138.4 mg/dL
Microalb Creat Ratio: 0.9 mg/g (ref 0.0–30.0)
Microalb, Ur: 1.2 mg/dL (ref 0.0–1.9)

## 2015-11-23 LAB — POCT GLYCOSYLATED HEMOGLOBIN (HGB A1C): Hemoglobin A1C: 8

## 2015-11-23 LAB — TSH: TSH: 1.29 u[IU]/mL (ref 0.35–4.50)

## 2015-11-23 NOTE — Patient Instructions (Addendum)
check your blood sugar 8 times a day.  vary the time of day when you check, between before the 3 meals, and at bedtime.  also check if you have symptoms of your blood sugar being too high or too low.  please keep a record of the readings and bring it to your next appointment here.  please call us sooner if your blood sugar goes below 70, or if you have a lot of readings over 200.   Please take these pump settings:  increase basal rate of 0.6 units/hr, 24 HRS per day mealtime bolus of 1 unit/15 grams of carbohydrate.  correction bolus (which some people call "sensitivity," or "insulin sensitivity ratio," or just "isr") of 1 unit for each 50 by which your glucose exceeds 100.   Please come back for a follow-up appointment in 3 months.

## 2015-11-23 NOTE — Progress Notes (Signed)
Subjective:    [name], female    DOB: [birth year], 45 y.o.   MRN: [id number]  HPI Pt returns for f/u of diabetes mellitus:  DM type: 1  Dx'ed: [id number] Complications: none.   Therapy: insulin since dx. GDM: never DKA: never Severe hypoglycemia: never.  Pancreatitis: never Other: she has been on pump (medtronic) rx since 2014; she stopped continuous glucose monitor, due to cost.   Interval history:  She takes these pump settings:  Basal: 0.875 units/HR MN-7 AM, 0.825 units/HR, 7 AM-11 AM, and 0.775 units/HR, 11 am-12 midnight.   mealtime bolus of 1 unit/20 grams carbohydrate.   correction bolus (which some people call "sensitivity," or "insulin sensitivity ratio," or just "isr") of 1 unit for each by 60 which your glucose exceeds 100.   She averages a total of approx 32 units per day. pt states she feels well in general.  She is about to resume continuous glucose monitor.  She did not make the pump adjustments advised at last ov.  Meter is downloaded today, and the printout is scanned into the record.  cbg varies from 70-400.  It is in general higher lowest fasting, but not necessarily so.   Past Medical History:  Diagnosis Date  . Acute sinusitis 08/07/2015  . Allergic state 08/07/2015  . Anxiety   . Depression   . Diabetes mellitus   . High cholesterol   . Hypothyroidism     Past Surgical History:  Procedure Laterality Date  . APPENDECTOMY      Social History   Social History  . Marital status: Divorced    Spouse name: N/A  . Number of children: N/A  . Years of education: N/A   Occupational History  . Not on file.   Social History Main Topics  . Smoking status: Never Smoker  . Smokeless tobacco: Never Used  . Alcohol use No  . Drug use: No  . Sexual activity: Yes    Birth control/ protection: IUD   Other Topics Concern  . Not on file   Social History Narrative  . No narrative on file    Current Outpatient Prescriptions on File Prior  to Visit  Medication Sig Dispense Refill  . atorvastatin (LIPITOR) 10 MG tablet Take 1 tablet (10 mg total) by mouth daily. 90 tablet 3  . buPROPion (WELLBUTRIN XL) 300 MG 24 hr tablet Take 1 tablet (300 mg total) by mouth every morning. 90 tablet 3  . Continuous Glucose Monitor Sup (ENLITE GLUCOSE SENSOR) MISC 1 Device by Does not apply route once a week. 13 each 3  . FLUoxetine (PROZAC) 10 MG tablet Take 1-2 tablets (10-20 mg total) by mouth daily. (Patient taking differently: Take 30 mg by mouth daily. ) 180 tablet 3  . glucose blood (BAYER CONTOUR NEXT TEST) test strip Test blood glucose 2-3 times a day. Dx code: 250.01 200 each 10  . Insulin Infusion Pump Supplies (MINIMED INFUSION SET-MMT 399) MISC 1 Device by Does not apply route every 3 (three) days.    . Insulin Infusion Pump Supplies (PARADIGM RESERVOIR 3ML) MISC 1 Device by Does not apply route every 3 (three) days.    . insulin lispro (HUMALOG) 100 UNIT/ML injection 35 units per insulin pump daily. 10 mL 4  . levothyroxine (SYNTHROID, LEVOTHROID) 112 MCG tablet Take 1 tablet (112 mcg total) by mouth daily before breakfast. 90 tablet 3  . meloxicam (MOBIC) 15 MG tablet Take 1 tablet (15 mg total) by mouth  daily. 30 tablet 6  . mometasone (NASONEX) 50 MCG/ACT nasal spray Place 2 sprays into the nose daily.    . montelukast (SINGULAIR) 10 MG tablet Take 1 tablet (10 mg total) by mouth at bedtime. 90 tablet 3  . valsartan (DIOVAN) 40 MG tablet Take 1 tablet (40 mg total) by mouth daily. (Patient not taking: Reported on 11/23/2015) 30 tablet 11   No current facility-administered medications on file prior to visit.     Allergies  Allergen Reactions  . Ciprofloxin Hcl [Ciprofloxacin]   . Food Color Red [Red Dye]     Family History  Problem Relation Age of Onset  . Coronary artery disease Father   . Cancer Mother     T cell lymphoma    BP 116/82   Pulse 87   Ht 5\' 5"  (1.651 m)   Wt 160 lb (72.6 kg)   SpO2 96%   BMI 26.63  kg/m    Review of Systems Denies LOC.      Objective:   Physical Exam VITAL SIGNS:  See vs page GENERAL: no distress Pulses: dorsalis pedis intact bilat.   MSK: no deformity of the feet CV: no leg edema Skin:  no ulcer on the feet.  normal color and temp on the feet. Neuro: sensation is intact to touch on the feet.    Lab Results  Component Value Date   ALT 37 (H) 11/23/2015   AST 39 (H) 11/23/2015   ALKPHOS 85 11/23/2015   BILITOT 0.6 11/23/2015    A1c=8.0%    Assessment & Plan:  Type 1 DM: she needs increased rx.   elev hepatic transaminases, new, uncertain etiology.  Recheck in 2 weeks.   Noncompliance with pump settings.    Patient is advised the following: Patient Instructions  check your blood sugar 8 times a day.  vary the time of day when you check, between before the 3 meals, and at bedtime.  also check if you have symptoms of your blood sugar being too high or too low.  please keep a record of the readings and bring it to your next appointment here.  please call us sooner if your blood sugar goes below 70, or if you have a lot of readings over 200.   Please take these pump settings:  increase basal rate of 0.6 units/hr, 24 HRS per day mealtime bolus of 1 unit/15 grams of carbohydrate.  correction bolus (which some people call "sensitivity," or "insulin sensitivity ratio," or just "isr") of 1 unit for each 50 by which your glucose exceeds 100.   Please come back for a follow-up appointment in 3 months.

## 2015-11-24 ENCOUNTER — Other Ambulatory Visit: Payer: Self-pay | Admitting: Endocrinology

## 2015-11-24 DIAGNOSIS — R7989 Other specified abnormal findings of blood chemistry: Secondary | ICD-10-CM

## 2015-11-24 DIAGNOSIS — R945 Abnormal results of liver function studies: Secondary | ICD-10-CM

## 2015-11-26 ENCOUNTER — Telehealth: Payer: Self-pay | Admitting: Internal Medicine

## 2015-11-26 ENCOUNTER — Telehealth: Payer: Self-pay | Admitting: Endocrinology

## 2015-11-26 NOTE — Telephone Encounter (Signed)
See message and please advise, Thanks!  

## 2015-11-26 NOTE — Telephone Encounter (Signed)
[facility] Call: Caller states that she has symptoms of urinary tract infection. Has frequency and burning on Saturday.  Took AZO and seemed to be better. Saw MD 11/24/15 for routine follow up and urine sample was taken.

## 2015-11-26 NOTE — Telephone Encounter (Signed)
Patient states she is having symptoms of a UTI.  Patient did a urinalysis with Dr. Loanne Drilling.  Dr. Loanne Drilling has informed her that she needed to call Dr. Sharlet Salina to get MD to look at notes and prescribe medication.  Please follow up with patient in regard.

## 2015-11-26 NOTE — Telephone Encounter (Signed)
I contacted the patient and advised of this message. Patient voiced understanding.

## 2015-11-26 NOTE — Telephone Encounter (Signed)
Pt calling again regarding what to do about her possible UTI

## 2015-11-26 NOTE — Telephone Encounter (Signed)
Please ask PCP 

## 2015-11-26 NOTE — Telephone Encounter (Signed)
Please call Dr. Cordelia Pen office and find out from Dr. Loanne Drilling if he is asking [name] to interpret the results. If so, please let him know that patient does not have a UTI. Recommend that they advise patient of same.

## 2015-11-29 NOTE — Telephone Encounter (Signed)
Dr Sharlet Salina office called and stated that patient do not have a UTI.  Any questions call [phone]

## 2015-11-29 NOTE — Telephone Encounter (Signed)
See message and please advise, Thanks!  

## 2015-11-29 NOTE — Telephone Encounter (Signed)
Spoke to Dr. Cordelia Pen office and informed that patient does not have a UTI and asked that they follow up with the patient.

## 2015-12-07 ENCOUNTER — Other Ambulatory Visit (INDEPENDENT_AMBULATORY_CARE_PROVIDER_SITE_OTHER): Payer: 59

## 2015-12-07 ENCOUNTER — Encounter: Payer: Self-pay | Admitting: Internal Medicine

## 2015-12-07 ENCOUNTER — Ambulatory Visit (INDEPENDENT_AMBULATORY_CARE_PROVIDER_SITE_OTHER): Payer: 59 | Admitting: Internal Medicine

## 2015-12-07 VITALS — BP 114/78 | HR 88 | Temp 98.5°F | Resp 18 | Ht 65.5 in | Wt 166.0 lb

## 2015-12-07 DIAGNOSIS — R300 Dysuria: Secondary | ICD-10-CM | POA: Diagnosis not present

## 2015-12-07 DIAGNOSIS — R7989 Other specified abnormal findings of blood chemistry: Secondary | ICD-10-CM | POA: Diagnosis not present

## 2015-12-07 DIAGNOSIS — R945 Abnormal results of liver function studies: Secondary | ICD-10-CM

## 2015-12-07 DIAGNOSIS — R748 Abnormal levels of other serum enzymes: Secondary | ICD-10-CM

## 2015-12-07 LAB — POCT URINALYSIS DIPSTICK
BILIRUBIN UA: NEGATIVE
Blood, UA: NEGATIVE
Glucose, UA: NEGATIVE
Ketones, UA: NEGATIVE
NITRITE UA: POSITIVE
PH UA: 6.5
Protein, UA: NEGATIVE
Spec Grav, UA: 1.015
Urobilinogen, UA: 1

## 2015-12-07 LAB — HEPATIC FUNCTION PANEL
ALT: 15 U/L (ref 0–35)
AST: 26 U/L (ref 0–37)
Albumin: 4.2 g/dL (ref 3.5–5.2)
Alkaline Phosphatase: 62 U/L (ref 39–117)
Bilirubin, Direct: 0.1 mg/dL (ref 0.0–0.3)
TOTAL PROTEIN: 7.5 g/dL (ref 6.0–8.3)
Total Bilirubin: 0.4 mg/dL (ref 0.2–1.2)

## 2015-12-07 MED ORDER — FLUCONAZOLE 150 MG PO TABS: 150.0000 mg | ORAL_TABLET | Freq: Once | ORAL | 0 refills | Status: AC

## 2015-12-07 MED ORDER — SULFAMETHOXAZOLE-TRIMETHOPRIM 800-160 MG PO TABS: 1.0000 | ORAL_TABLET | Freq: Two times a day (BID) | ORAL | 0 refills | Status: DC

## 2015-12-07 NOTE — Progress Notes (Signed)
   Subjective:    [name], female    DOB: 01/17/1971, 45 y.o.   MRN: [id number]  HPI The patient is a 45 YO female coming in for several concerns today. She is having pain with urination for the last several weeks. Some mild in the pelvic region. No yeast infection or vaginal discharge. No fevers or chills. Sugars have been stable lately. Some frequency as well. She talked to her endocrine doctor about this and he checked her urine but never got back to her about the results. She decided to come in to get it checked with [name].  The next concern is some change in her liver numbers as well with her endocrine doctor. He was very concerned about this and told her that the government wanted her checked for hepatitis on mychart message so she was concerned. She did some research on the numbers and is wanting to get our opinion on them. She does not drink much alcohol and denies taking tylenol excessively. She denies any recent travel or symptoms or jaundice in the past. She is curious if she needs to be checked for hepatitis. She was revaccinated against hep b while her spouse was in the service.   Review of Systems  Constitutional: Negative.   Respiratory: Negative.   Cardiovascular: Negative.   Gastrointestinal: Negative.   Genitourinary: Positive for dysuria, frequency and urgency. Negative for decreased urine volume, difficulty urinating, flank pain, hematuria, menstrual problem, pelvic pain, vaginal bleeding, vaginal discharge and vaginal pain.  Musculoskeletal: Negative.       Objective:   Physical Exam  Constitutional: She is oriented to person, place, and time. She appears well-developed and well-nourished.  HENT:  Head: Normocephalic and atraumatic.  Eyes: EOM are normal.  Cardiovascular: Normal rate and regular rhythm.   Pulmonary/Chest: Effort normal. No respiratory distress. She has no wheezes. She has no rales.  Abdominal: Soft. Bowel sounds are normal. She  exhibits no distension and no mass. There is no tenderness. There is no rebound and no guarding.  Musculoskeletal: She exhibits no edema.  Neurological: She is alert and oriented to person, place, and time.  Skin: Skin is warm and dry.   Vitals:   12/07/15 0825  BP: 114/78  Pulse: 88  Resp: 18  Temp: 98.5 F (36.9 C)  TempSrc: Oral  SpO2: 98%  Weight: 166 lb (75.3 kg)  Height: 5' 5.5" (1.664 m)      Assessment & Plan:

## 2015-12-07 NOTE — Assessment & Plan Note (Signed)
Will recheck liver enzymes today to rule out worsening. We did talk about the fact that her liver enzmes are not twice normal and likely not significant. We talked about safe alcohol limits and tylenol limits. If levels stable will not work up further. More than likely she has change in fat storage in the liver from her diabetes.

## 2015-12-07 NOTE — Patient Instructions (Signed)
We have sent in the bactrim for the uti. Take 1 pill twice a day for 5 days.   We have also sent in diflucan in case you get a yeast infection.   We are checking the liver numbers again but if they are the same as before we would not be concerned about it.

## 2015-12-07 NOTE — Progress Notes (Signed)
Pre visit review using our clinic review tool, if applicable. No additional management support is needed unless otherwise documented below in the visit note. 

## 2015-12-07 NOTE — Assessment & Plan Note (Signed)
U/A consistent with infection today and will treat with bactrim. Rx also well for diflucan as she is high risk to get yeast infection from treatment with her diabetes.

## 2015-12-10 ENCOUNTER — Encounter: Payer: Self-pay | Admitting: Internal Medicine

## 2015-12-10 DIAGNOSIS — R300 Dysuria: Secondary | ICD-10-CM

## 2015-12-20 ENCOUNTER — Other Ambulatory Visit: Payer: Self-pay | Admitting: Internal Medicine

## 2015-12-20 ENCOUNTER — Telehealth: Payer: Self-pay | Admitting: Internal Medicine

## 2015-12-20 MED ORDER — MONTELUKAST SODIUM 10 MG PO TABS: 10.0000 mg | ORAL_TABLET | Freq: Every day | ORAL | 3 refills | Status: DC

## 2015-12-20 MED ORDER — ATORVASTATIN CALCIUM 10 MG PO TABS: 10.0000 mg | ORAL_TABLET | Freq: Every day | ORAL | 3 refills | Status: DC

## 2015-12-20 NOTE — Telephone Encounter (Signed)
atorvastatin (LIPITOR) 10 MG tablet [id number]  levothyroxine (SYNTHROID, LEVOTHROID) 112 MCG tablet [id number]  montelukast (SINGULAIR) 10 MG tablet [id number]   Patient called to advise that she spoke to dr crawford at her last visit. Dr Sharlet Salina advised that she would write 79 day supplies for these 3 medications. She is in need of renewal. Please send to [facility] on file.

## 2015-12-20 NOTE — Telephone Encounter (Signed)
Sent to pharmacy 

## 2015-12-22 ENCOUNTER — Telehealth: Payer: Self-pay | Admitting: Endocrinology

## 2015-12-22 MED ORDER — INSULIN LISPRO 100 UNIT/ML ~~LOC~~ SOLN: SUBCUTANEOUS | 2 refills | Status: DC

## 2015-12-22 NOTE — Telephone Encounter (Signed)
Refill submitted per patient's request.  

## 2015-12-22 NOTE — Telephone Encounter (Signed)
Pt is asking for a 90 day supply of humalog called into [facility] on [address]

## 2016-01-18 ENCOUNTER — Other Ambulatory Visit: Payer: Self-pay | Admitting: Internal Medicine

## 2016-02-22 ENCOUNTER — Encounter: Payer: Self-pay | Admitting: Internal Medicine

## 2016-02-22 MED ORDER — FLUOXETINE HCL 10 MG PO TABS: 30.0000 mg | ORAL_TABLET | Freq: Every day | ORAL | 11 refills | Status: DC

## 2016-02-23 ENCOUNTER — Ambulatory Visit: Payer: 59 | Admitting: Endocrinology

## 2016-07-03 ENCOUNTER — Ambulatory Visit (INDEPENDENT_AMBULATORY_CARE_PROVIDER_SITE_OTHER): Payer: Self-pay | Admitting: Family

## 2016-07-03 ENCOUNTER — Encounter: Payer: Self-pay | Admitting: Family

## 2016-07-03 VITALS — BP 108/78 | HR 80 | Temp 99.3°F | Resp 16 | Ht 65.5 in | Wt 160.8 lb

## 2016-07-03 DIAGNOSIS — R05 Cough: Secondary | ICD-10-CM

## 2016-07-03 DIAGNOSIS — R058 Other specified cough: Secondary | ICD-10-CM | POA: Insufficient documentation

## 2016-07-03 MED ORDER — PREDNISONE 20 MG PO TABS: 10.0000 mg | ORAL_TABLET | Freq: Two times a day (BID) | ORAL | 0 refills | Status: DC | PRN

## 2016-07-03 MED ORDER — ALBUTEROL SULFATE HFA 108 (90 BASE) MCG/ACT IN AERS: 2.0000 | INHALATION_SPRAY | Freq: Four times a day (QID) | RESPIRATORY_TRACT | 1 refills | Status: DC | PRN

## 2016-07-03 NOTE — Assessment & Plan Note (Signed)
Symptoms and exam consistent with postviral cough syndrome and airway irritation. Start prednisone and albuterol as needed. Continue over-the-counter medications as needed for symptom relief and supportive care. Follow-up if symptoms worsen or do not improve.

## 2016-07-03 NOTE — Patient Instructions (Signed)
Thank you for choosing [facility].  SUMMARY AND INSTRUCTIONS:  Continue to take the over the counter medications as needed.   Start the prednisone as needed for inflammation.  Monitor your blood sugars.   Follow up if symptoms worsen or do not improve.   Medication:  Your prescription(s) have been submitted to your pharmacy or been printed and provided for you. Please take as directed and contact our office if you believe you are having problem(s) with the medication(s) or have any questions.  Follow up:  If your symptoms worsen or fail to improve, please contact our office for further instruction, or in case of emergency go directly to the emergency room at the closest medical facility.   General Recommendations:    Please drink plenty of fluids.  Get plenty of rest   Sleep in humidified air  Use saline nasal sprays  Netti pot   OTC Medications:  Decongestants - helps relieve congestion   Flonase (generic fluticasone) or Nasacort (generic triamcinolone) - please make sure to use the "cross-over" technique at a 45 degree angle towards the opposite eye as opposed to straight up the nasal passageway.   Sudafed (generic pseudoephedrine - Note this is the one that is available behind the pharmacy counter); Products with phenylephrine (-PE) may also be used but is often not as effective as pseudoephedrine.   If you have HIGH BLOOD PRESSURE - Coricidin HBP; AVOID any product that is -D as this contains pseudoephedrine which may increase your blood pressure.  Afrin (oxymetazoline) every 6-8 hours for up to 3 days.   Allergies - helps relieve runny nose, itchy eyes and sneezing   Claritin (generic loratidine), Allegra (fexofenidine), or Zyrtec (generic cyrterizine) for runny nose. These medications should not cause drowsiness.  Note - Benadryl (generic diphenhydramine) may be used however may cause drowsiness  Cough -   Delsym or Robitussin (generic  dextromethorphan)  Expectorants - helps loosen mucus to ease removal   Mucinex (generic guaifenesin) as directed on the package.  Headaches / General Aches   Tylenol (generic acetaminophen) - DO NOT EXCEED 3 grams (3,000 mg) in a 24 hour time period  Advil/Motrin (generic ibuprofen)   Sore Throat -   Salt water gargle   Chloraseptic (generic benzocaine) spray or lozenges / Sucrets (generic dyclonine)

## 2016-07-03 NOTE — Progress Notes (Signed)
Subjective:    Patient ID: [name], female    DOB: 03/06/1970, 46 y.o.   MRN: [id number]  Chief Complaint  Patient presents with  . Cough    productive cough, having trouble breathing at night, has been going on for a couple of week    HPI:  THANIA WOODLIEF is a 46 y.o. female who  has a past medical history of Acute sinusitis (08/07/2015); Allergic state (08/07/2015); Anxiety; Depression; Diabetes mellitus; High cholesterol; and Hypothyroidism. and presents today for an acute office visit.  This is a new problem. Associated symptom of productive cough and occasional having trouble breathing at night has been going on for about 2 weeks. Recently completed a course of Amoxicillin with the pain improving and has come back. Denies fevers. Describes having a pulling sensation she takes the antibiotic. Course of the symptoms are generally worsening. Has also tried Mucinex and Tessalon pearles which has helped.   Allergies  Allergen Reactions  . Ciprofloxin Hcl [Ciprofloxacin]   . Food Color Red [Red Dye]       Outpatient Medications Prior to Visit  Medication Sig Dispense Refill  . atorvastatin (LIPITOR) 10 MG tablet Take 1 tablet (10 mg total) by mouth daily. 90 tablet 3  . buPROPion (WELLBUTRIN XL) 300 MG 24 hr tablet TAKE ONE TABLET BY MOUTH IN THE MORNING 30 tablet 11  . Continuous Glucose Monitor Sup (ENLITE GLUCOSE SENSOR) MISC 1 Device by Does not apply route once a week. 13 each 3  . FLUoxetine (PROZAC) 10 MG tablet Take 3 tablets (30 mg total) by mouth daily. 90 tablet 11  . glucose blood (BAYER CONTOUR NEXT TEST) test strip Test blood glucose 2-3 times a day. Dx code: 250.01 200 each 10  . Insulin Infusion Pump Supplies (MINIMED INFUSION SET-MMT 399) MISC 1 Device by Does not apply route every 3 (three) days.    . Insulin Infusion Pump Supplies (PARADIGM RESERVOIR 3ML) MISC 1 Device by Does not apply route every 3 (three) days.    . insulin lispro (HUMALOG) 100  UNIT/ML injection 35 units per insulin pump daily. 40 mL 2  . levothyroxine (SYNTHROID, LEVOTHROID) 112 MCG tablet TAKE ONE TABLET BY MOUTH ONCE DAILY BEFORE  BREAKFAST 90 tablet 3  . meloxicam (MOBIC) 15 MG tablet Take 1 tablet (15 mg total) by mouth daily. 30 tablet 6  . mometasone (NASONEX) 50 MCG/ACT nasal spray Place 2 sprays into the nose daily.    . montelukast (SINGULAIR) 10 MG tablet Take 1 tablet (10 mg total) by mouth at bedtime. 90 tablet 3  . valsartan (DIOVAN) 40 MG tablet Take 1 tablet (40 mg total) by mouth daily. 30 tablet 11  . sulfamethoxazole-trimethoprim (BACTRIM DS) 800-160 MG tablet Take 1 tablet by mouth 2 (two) times daily. 10 tablet 0   No facility-administered medications prior to visit.      Past Medical History:  Diagnosis Date  . Acute sinusitis 08/07/2015  . Allergic state 08/07/2015  . Anxiety   . Depression   . Diabetes mellitus   . High cholesterol   . Hypothyroidism     Review of Systems  Constitutional: Negative for chills and fever.  HENT: Positive for congestion.   Respiratory: Positive for cough and shortness of breath. Negative for chest tightness.   Cardiovascular: Negative for chest pain, palpitations and leg swelling.      Objective:    BP 108/78 (BP Location: Left Arm, Patient Position: Sitting, Cuff Size: Large)  Pulse 80   Temp 99.3 F (37.4 C) (Oral)   Resp 16   Ht 5' 5.5" (1.664 m)   Wt 160 lb 12.8 oz (72.9 kg)   SpO2 98%   BMI 26.35 kg/m  Nursing note and vital signs reviewed.  Physical Exam  Constitutional: She is oriented to person, place, and time. She appears well-developed and well-nourished. No distress.  HENT:  Right Ear: Hearing, tympanic membrane, external ear and ear canal normal.  Left Ear: Hearing, tympanic membrane, external ear and ear canal normal.  Nose: Nose normal.  Mouth/Throat: Uvula is midline.  Neck: Neck supple.  Cardiovascular: Normal rate, regular rhythm, normal heart sounds and intact distal  pulses.  Exam reveals no gallop and no friction rub.   No murmur heard. Pulmonary/Chest: Effort normal and breath sounds normal. No respiratory distress. She has no wheezes. She has no rales. She exhibits no tenderness.  Lymphadenopathy:    She has no cervical adenopathy.  Neurological: She is alert and oriented to person, place, and time.  Skin: Skin is warm and dry.  Psychiatric: She has a normal mood and affect. Her behavior is normal. Judgment and thought content normal.       Assessment & Plan:   Problem List Items Addressed This Visit      Respiratory   Post-viral cough syndrome - Primary    Symptoms and exam consistent with postviral cough syndrome and airway irritation. Start prednisone and albuterol as needed. Continue over-the-counter medications as needed for symptom relief and supportive care. Follow-up if symptoms worsen or do not improve.          I have discontinued Ms. Cruise's sulfamethoxazole-trimethoprim. I am also having her start on predniSONE and albuterol. Additionally, I am having her maintain her glucose blood, PARADIGM RESERVOIR 3ML, MINIMED INFUSION SET-MMT 399, valsartan, ENLITE GLUCOSE SENSOR, mometasone, meloxicam, levothyroxine, atorvastatin, montelukast, insulin lispro, buPROPion, and FLUoxetine.   Meds ordered this encounter  Medications  . predniSONE (DELTASONE) 20 MG tablet    Sig: Take 0.5-1 tablets (10-20 mg total) by mouth 2 (two) times daily as needed.    Dispense:  10 tablet    Refill:  0    Order Specific Question:   Supervising Provider    Answer:   Pricilla Holm A [[id number]]  . albuterol (PROVENTIL HFA;VENTOLIN HFA) 108 (90 Base) MCG/ACT inhaler    Sig: Inhale 2 puffs into the lungs every 6 (six) hours as needed for wheezing or shortness of breath.    Dispense:  1 Inhaler    Refill:  1    Order Specific Question:   Supervising Provider    Answer:   Pricilla Holm A [[id number]]     Follow-up: Return if symptoms worsen or fail  to improve.  Mauricio Po, FNP

## 2016-08-04 ENCOUNTER — Ambulatory Visit (INDEPENDENT_AMBULATORY_CARE_PROVIDER_SITE_OTHER): Payer: Self-pay | Admitting: Nurse Practitioner

## 2016-08-04 ENCOUNTER — Encounter: Payer: Self-pay | Admitting: Nurse Practitioner

## 2016-08-04 VITALS — BP 120/72 | HR 85 | Temp 99.6°F | Ht 65.5 in | Wt 162.0 lb

## 2016-08-04 DIAGNOSIS — S161XXA Strain of muscle, fascia and tendon at neck level, initial encounter: Secondary | ICD-10-CM

## 2016-08-04 MED ORDER — KETOROLAC TROMETHAMINE 30 MG/ML IJ SOLN
30.0000 mg | Freq: Once | INTRAMUSCULAR | Status: AC
  Administered 2016-08-04: 30 mg via INTRAMUSCULAR

## 2016-08-04 MED ORDER — NAPROXEN SODIUM 220 MG PO TABS: 220.0000 mg | ORAL_TABLET | Freq: Three times a day (TID) | ORAL | Status: DC

## 2016-08-04 MED ORDER — CYCLOBENZAPRINE HCL 10 MG PO TABS: 10.0000 mg | ORAL_TABLET | Freq: Every day | ORAL | 0 refills | Status: DC

## 2016-08-04 NOTE — Progress Notes (Signed)
Subjective:  [name], female    DOB: November 14, 1970  Age: 46 y.o. MRN: [id number]  CC: Pain (painful on left upper back,cant sleep going on for 2 wks. releive with support and heat (thats why wearing neck support). )   Neck Pain   This is a new problem. The current episode started 1 to 4 weeks ago. The problem occurs intermittently. The problem has been waxing and waning. The pain is associated with a sleep position. The quality of the pain is described as aching and cramping. The symptoms are aggravated by twisting and position. The pain is worse during the night. Pertinent negatives include no chest pain, fever, headaches, leg pain, numbness, pain with swallowing, paresis, syncope, tingling, trouble swallowing, visual change, weakness or weight loss. She has tried ice, heat and neck support for the symptoms. The treatment provided moderate relief.  no hx of neck injury or head injury surgery.  Outpatient Medications Prior to Visit  Medication Sig Dispense Refill  . albuterol (PROVENTIL HFA;VENTOLIN HFA) 108 (90 Base) MCG/ACT inhaler Inhale 2 puffs into the lungs every 6 (six) hours as needed for wheezing or shortness of breath. 1 Inhaler 1  . atorvastatin (LIPITOR) 10 MG tablet Take 1 tablet (10 mg total) by mouth daily. 90 tablet 3  . buPROPion (WELLBUTRIN XL) 300 MG 24 hr tablet TAKE ONE TABLET BY MOUTH IN THE MORNING 30 tablet 11  . Continuous Glucose Monitor Sup (ENLITE GLUCOSE SENSOR) MISC 1 Device by Does not apply route once a week. 13 each 3  . FLUoxetine (PROZAC) 10 MG tablet Take 3 tablets (30 mg total) by mouth daily. 90 tablet 11  . glucose blood (BAYER CONTOUR NEXT TEST) test strip Test blood glucose 2-3 times a day. Dx code: 250.01 200 each 10  . Insulin Infusion Pump Supplies (MINIMED INFUSION SET-MMT 399) MISC 1 Device by Does not apply route every 3 (three) days.    . Insulin Infusion Pump Supplies (PARADIGM RESERVOIR 3ML) MISC 1 Device by Does not apply  route every 3 (three) days.    . insulin lispro (HUMALOG) 100 UNIT/ML injection 35 units per insulin pump daily. 40 mL 2  . levothyroxine (SYNTHROID, LEVOTHROID) 112 MCG tablet TAKE ONE TABLET BY MOUTH ONCE DAILY BEFORE  BREAKFAST 90 tablet 3  . mometasone (NASONEX) 50 MCG/ACT nasal spray Place 2 sprays into the nose daily.    . montelukast (SINGULAIR) 10 MG tablet Take 1 tablet (10 mg total) by mouth at bedtime. 90 tablet 3  . valsartan (DIOVAN) 40 MG tablet Take 1 tablet (40 mg total) by mouth daily. 30 tablet 11  . meloxicam (MOBIC) 15 MG tablet Take 1 tablet (15 mg total) by mouth daily. 30 tablet 6  . predniSONE (DELTASONE) 20 MG tablet Take 0.5-1 tablets (10-20 mg total) by mouth 2 (two) times daily as needed. 10 tablet 0   No facility-administered medications prior to visit.     ROS See HPI  Objective:  BP 120/72   Pulse 85   Temp 99.6 F (37.6 C)   Ht 5' 5.5" (1.664 m)   Wt 162 lb (73.5 kg)   SpO2 99%   BMI 26.55 kg/m   BP Readings from Last 3 Encounters:  08/04/16 120/72  07/03/16 108/78  12/07/15 114/78    Wt Readings from Last 3 Encounters:  08/04/16 162 lb (73.5 kg)  07/03/16 160 lb 12.8 oz (72.9 kg)  12/07/15 166 lb (75.3 kg)    Physical Exam  Constitutional: She is oriented to person, place, and time. No distress.  HENT:  Mouth/Throat: No oropharyngeal exudate.  Neck: Normal range of motion. Neck supple. Muscular tenderness present. No spinous process tenderness present. No neck rigidity. No erythema present. No thyromegaly present.  Cardiovascular: Normal rate, regular rhythm and normal heart sounds.   Pulmonary/Chest: Effort normal and breath sounds normal.  Musculoskeletal: She exhibits tenderness. She exhibits no edema.  Lymphadenopathy:    She has no cervical adenopathy.  Neurological: She is alert and oriented to person, place, and time.  Skin: Skin is warm and dry. No rash noted. No erythema.  Vitals reviewed.   Lab Results  Component Value  Date   WBC 12.2 (H) 12/05/2010   HGB 13.9 12/05/2010   HCT 38.5 12/05/2010   PLT 367 12/05/2010   GLUCOSE 125 (H) 11/23/2015   CHOL 177 11/23/2015   TRIG 100.0 11/23/2015   HDL 66.30 11/23/2015   LDLCALC 91 11/23/2015   ALT 15 12/07/2015   AST 26 12/07/2015   NA 139 11/23/2015   K 4.3 11/23/2015   CL 103 11/23/2015   CREATININE 0.93 11/23/2015   BUN 13 11/23/2015   CO2 27 11/23/2015   TSH 1.29 11/23/2015   HGBA1C 8.0 11/23/2015   MICROALBUR 1.2 11/23/2015    Mm Screening Breast Tomo Bilateral  Result Date: 10/26/2015 CLINICAL DATA:  Screening. EXAM: 2D DIGITAL SCREENING BILATERAL MAMMOGRAM WITH CAD AND ADJUNCT TOMO COMPARISON:  Previous exam(s). ACR Breast Density Category c: The breast tissue is heterogeneously dense, which may obscure small masses. FINDINGS: There are no findings suspicious for malignancy. Images were processed with CAD. IMPRESSION: No mammographic evidence of malignancy. A result letter of this screening mammogram will be mailed directly to the patient. RECOMMENDATION: Screening mammogram in one year. (Code:[id number]-B-01Y) BI-RADS CATEGORY  1: Negative. Electronically Signed   By: Fidela Salisbury M.D.   On: 10/28/2015 10:04    Assessment & Plan:   Jennifer Hogan was seen today for pain.  Diagnoses and all orders for this visit:  Posterolateral cervical muscle strain, initial encounter -     ketorolac (TORADOL) 30 MG/ML injection 30 mg; Inject 1 mL (30 mg total) into the muscle once. -     cyclobenzaprine (FLEXERIL) 10 MG tablet; Take 1 tablet (10 mg total) by mouth at bedtime. -     naproxen sodium (ANAPROX) 220 MG tablet; Take 1 tablet (220 mg total) by mouth 3 (three) times daily with meals.   I have discontinued Ms. Jennifer Hogan's meloxicam and predniSONE. I am also having her start on cyclobenzaprine and naproxen sodium. Additionally, I am having her maintain her glucose blood, PARADIGM RESERVOIR 3ML, MINIMED INFUSION SET-MMT 399, valsartan, ENLITE GLUCOSE SENSOR,  mometasone, levothyroxine, atorvastatin, montelukast, insulin lispro, buPROPion, FLUoxetine, and albuterol. We administered ketorolac.  Meds ordered this encounter  Medications  . ketorolac (TORADOL) 30 MG/ML injection 30 mg  . cyclobenzaprine (FLEXERIL) 10 MG tablet    Sig: Take 1 tablet (10 mg total) by mouth at bedtime.    Dispense:  7 tablet    Refill:  0    Order Specific Question:   Supervising Provider    Answer:   Cassandria Anger [[id number]]  . naproxen sodium (ANAPROX) 220 MG tablet    Sig: Take 1 tablet (220 mg total) by mouth 3 (three) times daily with meals.    Order Specific Question:   Supervising Provider    Answer:   Cassandria Anger [[id number]]    Follow-up: Return if symptoms worsen  or fail to improve.  Wilfred Lacy, NP

## 2016-08-04 NOTE — Patient Instructions (Signed)
Cervical Sprain A cervical sprain is a stretch or tear in the tissues that connect bones (ligaments) in the neck. Most neck (cervical) sprains get better in 4-6 weeks. Follow these instructions at home: If you have a neck collar:  Wear it as told by your doctor. Do not take off (do not remove) the collar unless your doctor says that this is safe.  Ask your doctor before adjusting your collar.  If you have long hair, keep it outside of the collar.  Ask your doctor if you may take off the collar for cleaning and bathing. If you may take off the collar: ? Follow instructions from your doctor about how to take off the collar safely. ? Clean the collar by wiping it with mild soap and water. Let it air-dry all the way. ? If your collar has removable pads:  Take the pads out every 1-2 days.  Hand wash the pads with soap and water.  Let the pads air-dry all the way before you put them back in the collar. Do not dry them in a clothes dryer. Do not dry them with a hair dryer. ? Check your skin under the collar for irritation or sores. If you see any, tell your doctor. Managing pain, stiffness, and swelling  Use a cervical traction device, if told by your doctor.  If told, put heat on the affected area. Do this before exercises (physical therapy) or as often as told by your doctor. Use the heat source that your doctor recommends, such as a moist heat pack or a heating pad. ? Place a towel between your skin and the heat source. ? Leave the heat on for 20-30 minutes. ? Take the heat off (remove the heat) if your skin turns bright red. This is very important if you cannot feel pain, heat, or cold. You may have a greater risk of getting burned.  Put ice on the affected area. ? Put ice in a plastic bag. ? Place a towel between your skin and the bag. ? Leave the ice on for 20 minutes, 2-3 times a day. Activity  Do not drive while wearing a neck collar. If you do not have a neck collar, ask your  doctor if it is safe to drive.  Do not drive or use heavy machinery while taking prescription pain medicine or muscle relaxants, unless your doctor approves.  Do not lift anything that is heavier than 10 lb (4.5 kg) until your doctor tells you that it is safe.  Rest as told by your doctor.  Avoid activities that make you feel worse. Ask your doctor what activities are safe for you.  Do exercises as told by your doctor or physical therapist. Preventing neck sprain  Practice good posture. Adjust your workstation to help with this, if needed.  Exercise regularly as told by your doctor or physical therapist.  Avoid activities that are risky or may cause a neck sprain (cervical sprain). General instructions  Take over-the-counter and prescription medicines only as told by your doctor.  Do not use any products that contain nicotine or tobacco. This includes cigarettes and e-cigarettes. If you need help quitting, ask your doctor.  Keep all follow-up visits as told by your doctor. This is important. Contact a doctor if:  You have pain or other symptoms that get worse.  You have symptoms that do not get better after 2 weeks.  You have pain that does not get better with medicine.  You start to have new,  unexplained symptoms.  You have sores or irritated skin from wearing your neck collar. Get help right away if:  You have very bad pain.  You have any of the following in any part of your body: ? Loss of feeling (numbness). ? Tingling. ? Weakness.  You cannot move a part of your body (you have paralysis).  Your activity level does not improve. Summary  A cervical sprain is a stretch or tear in the tissues that connect bones (ligaments) in the neck.  If you have a neck (cervical) collar, do not take off the collar unless your doctor says that this is safe.  Put ice on affected areas as told by your doctor.  Put heat on affected areas as told by your doctor.  Good posture  and regular exercise can help prevent a neck sprain from happening again. This information is not intended to replace advice given to you by your health care provider. Make sure you discuss any questions you have with your health care provider. Document Released: 06/28/2007 Document Revised: 09/21/2015 Document Reviewed: 09/21/2015 Elsevier Interactive Patient Education  2017 [employer]   Neck Exercises Neck exercises can be important for many reasons:  They can help you to improve and maintain flexibility in your neck. This can be especially important as you age.  They can help to make your neck stronger. This can make movement easier.  They can reduce or prevent neck pain.  They may help your upper back.  Ask your health care provider which neck exercises would be best for you. Exercises Neck Press Repeat this exercise 10 times. Do it first thing in the morning and right before bed or as told by your health care provider. 1. Lie on your back on a firm bed or on the floor with a pillow under your head. 2. Use your neck muscles to push your head down on the pillow and straighten your spine. 3. Hold the position as well as you can. Keep your head facing up and your chin tucked. 4. Slowly count to 5 while holding this position. 5. Relax for a few seconds. Then repeat.  Isometric Strengthening Do a full set of these exercises 2 times a day or as told by your health care provider. 1. Sit in a supportive chair and place your hand on your forehead. 2. Push forward with your head and neck while pushing back with your hand. Hold for 10 seconds. 3. Relax. Then repeat the exercise 3 times. 4. Next, do thesequence again, this time putting your hand against the back of your head. Use your head and neck to push backward against the hand pressure. 5. Finally, do the same exercise on either side of your head, pushing sideways against the pressure of your hand.  Prone Head Lifts Repeat this  exercise 5 times. Do this 2 times a day or as told by your health care provider. 1. Lie face-down, resting on your elbows so that your chest and upper back are raised. 2. Start with your head facing downward, near your chest. Position your chin either on or near your chest. 3. Slowly lift your head upward. Lift until you are looking straight ahead. Then continue lifting your head as far back as you can stretch. 4. Hold your head up for 5 seconds. Then slowly lower it to your starting position.  Supine Head Lifts Repeat this exercise 8-10 times. Do this 2 times a day or as told by your health care provider. 1. Shanda Howells  on your back, bending your knees to point to the ceiling and keeping your feet flat on the floor. 2. Lift your head slowly off the floor, raising your chin toward your chest. 3. Hold for 5 seconds. 4. Relax and repeat.  Scapular Retraction Repeat this exercise 5 times. Do this 2 times a day or as told by your health care provider. 1. Stand with your arms at your sides. Look straight ahead. 2. Slowly pull both shoulders backward and downward until you feel a stretch between your shoulder blades in your upper back. 3. Hold for 10-30 seconds. 4. Relax and repeat.  Contact a health care provider if:  Your neck pain or discomfort gets much worse when you do an exercise.  Your neck pain or discomfort does not improve within 2 hours after you exercise. If you have any of these problems, stop exercising right away. Do not do the exercises again unless your health care provider says that you can. Get help right away if:  You develop sudden, severe neck pain. If this happens, stop exercising right away. Do not do the exercises again unless your health care provider says that you can. Exercises Neck Stretch  Repeat this exercise 3-5 times. 1. Do this exercise while standing or while sitting in a chair. 2. Place your feet flat on the floor, shoulder-width apart. 3. Slowly turn your  head to the right. Turn it all the way to the right so you can look over your right shoulder. Do not tilt or tip your head. 4. Hold this position for 10-30 seconds. 5. Slowly turn your head to the left, to look over your left shoulder. 6. Hold this position for 10-30 seconds.  Neck Retraction Repeat this exercise 8-10 times. Do this 3-4 times a day or as told by your health care provider. 1. Do this exercise while standing or while sitting in a sturdy chair. 2. Look straight ahead. Do not bend your neck. 3. Use your fingers to push your chin backward. Do not bend your neck for this movement. Continue to face straight ahead. If you are doing the exercise properly, you will feel a slight sensation in your throat and a stretch at the back of your neck. 4. Hold the stretch for 1-2 seconds. Relax and repeat.  This information is not intended to replace advice given to you by your health care provider. Make sure you discuss any questions you have with your health care provider. Document Released: 12/21/2014 Document Revised: 06/17/2015 Document Reviewed: 07/20/2014 Elsevier Interactive Patient Education  2017 [employer].

## 2016-09-04 ENCOUNTER — Telehealth: Payer: Self-pay | Admitting: Endocrinology

## 2016-09-04 NOTE — Telephone Encounter (Signed)
Barbara from [facility] called in reference to needing PA for infusion sets for patient. Pamala Hurry stated the paperwork has already been faxed over. Please contact [facility] and advise at [phone].

## 2016-09-04 NOTE — Telephone Encounter (Signed)
[facility] calling to check on note below. Please call and advise.

## 2016-09-05 NOTE — Telephone Encounter (Signed)
See message and please advise, Thanks!  

## 2016-09-06 NOTE — Telephone Encounter (Signed)
Done Ov is due

## 2016-09-06 NOTE — Telephone Encounter (Signed)
Called patient and she stated that she would call back for appt. She is currently working two jobs & has to see when she will be off.

## 2016-12-30 ENCOUNTER — Ambulatory Visit (HOSPITAL_COMMUNITY)
Admission: EM | Admit: 2016-12-30 | Discharge: 2016-12-30 | Disposition: A | Discharge status: Nursing Home (Includes ICF) | Payer: Self-pay | Attending: Internal Medicine | Admitting: Internal Medicine

## 2016-12-30 ENCOUNTER — Other Ambulatory Visit: Payer: Self-pay

## 2016-12-30 ENCOUNTER — Emergency Department (HOSPITAL_COMMUNITY)
Admission: EM | Admit: 2016-12-30 | Discharge: 2016-12-31 | Disposition: A | Discharge status: Home / Self Care | Payer: Self-pay | Attending: Emergency Medicine | Admitting: Emergency Medicine

## 2016-12-30 ENCOUNTER — Encounter (HOSPITAL_COMMUNITY): Payer: Self-pay | Admitting: *Deleted

## 2016-12-30 ENCOUNTER — Encounter (HOSPITAL_COMMUNITY): Payer: Self-pay | Admitting: Emergency Medicine

## 2016-12-30 DIAGNOSIS — B349 Viral infection, unspecified: Secondary | ICD-10-CM | POA: Insufficient documentation

## 2016-12-30 DIAGNOSIS — Z794 Long term (current) use of insulin: Secondary | ICD-10-CM | POA: Insufficient documentation

## 2016-12-30 DIAGNOSIS — E119 Type 2 diabetes mellitus without complications: Secondary | ICD-10-CM | POA: Insufficient documentation

## 2016-12-30 DIAGNOSIS — Z79899 Other long term (current) drug therapy: Secondary | ICD-10-CM | POA: Insufficient documentation

## 2016-12-30 DIAGNOSIS — E039 Hypothyroidism, unspecified: Secondary | ICD-10-CM | POA: Insufficient documentation

## 2016-12-30 DIAGNOSIS — R1084 Generalized abdominal pain: Secondary | ICD-10-CM

## 2016-12-30 DIAGNOSIS — E860 Dehydration: Secondary | ICD-10-CM

## 2016-12-30 LAB — COMPREHENSIVE METABOLIC PANEL
ALBUMIN: 4.1 g/dL (ref 3.5–5.0)
ALT: 23 U/L (ref 14–54)
ANION GAP: 10 (ref 5–15)
AST: 36 U/L (ref 15–41)
Alkaline Phosphatase: 73 U/L (ref 38–126)
BUN: 10 mg/dL (ref 6–20)
CHLORIDE: 94 mmol/L — AB (ref 101–111)
CO2: 27 mmol/L (ref 22–32)
Calcium: 9.2 mg/dL (ref 8.9–10.3)
Creatinine, Ser: 0.77 mg/dL (ref 0.44–1.00)
GFR calc Af Amer: >60 mL/min (ref >60)
GFR calc non Af Amer: >60 mL/min (ref >60)
GLUCOSE: 184 mg/dL — AB (ref 65–99)
POTASSIUM: 3.2 mmol/L — AB (ref 3.5–5.1)
SODIUM: 131 mmol/L — AB (ref 135–145)
TOTAL PROTEIN: 7.5 g/dL (ref 6.5–8.1)
Total Bilirubin: 1.1 mg/dL (ref 0.3–1.2)

## 2016-12-30 LAB — URINALYSIS, ROUTINE W REFLEX MICROSCOPIC
BILIRUBIN URINE: NEGATIVE
Glucose, UA: >=500 mg/dL — AB
Ketones, ur: 80 mg/dL — AB
Leukocytes, UA: NEGATIVE
NITRITE: NEGATIVE
PROTEIN: 30 mg/dL — AB
Specific Gravity, Urine: 1.013 (ref 1.005–1.030)
pH: 6 (ref 5.0–8.0)

## 2016-12-30 LAB — CBC
HEMATOCRIT: 38.4 % (ref 36.0–46.0)
HEMOGLOBIN: 13.5 g/dL (ref 12.0–15.0)
MCH: 31.1 pg (ref 26.0–34.0)
MCHC: 35.2 g/dL (ref 30.0–36.0)
MCV: 88.5 fL (ref 78.0–100.0)
Platelets: 334 10*3/uL (ref 150–400)
RBC: 4.34 MIL/uL (ref 3.87–5.11)
RDW: 12.6 % (ref 11.5–15.5)
WBC: 10.4 10*3/uL (ref 4.0–10.5)

## 2016-12-30 LAB — LIPASE, BLOOD: LIPASE: 19 U/L (ref 11–51)

## 2016-12-30 MED ORDER — LACTATED RINGERS IV BOLUS (SEPSIS)
1000.0000 mL | Freq: Once | INTRAVENOUS | Status: AC
  Administered 2016-12-30: 1000 mL via INTRAVENOUS

## 2016-12-30 MED ORDER — ACETAMINOPHEN 325 MG PO TABS
ORAL_TABLET | ORAL | Status: AC
Start: 2016-12-30 — End: ?
  Filled 2016-12-30: qty 2

## 2016-12-30 MED ORDER — ONDANSETRON 4 MG PO TBDP
ORAL_TABLET | ORAL | Status: AC
Start: 2016-12-30 — End: ?
  Filled 2016-12-30: qty 1

## 2016-12-30 MED ORDER — ACETAMINOPHEN 325 MG PO TABS
650.0000 mg | ORAL_TABLET | Freq: Once | ORAL | Status: AC
  Administered 2016-12-30: 650 mg via ORAL

## 2016-12-30 MED ORDER — IOPAMIDOL (ISOVUE-300) INJECTION 61%
INTRAVENOUS | Status: AC
Start: 2016-12-30 — End: 2016-12-31
  Administered 2016-12-31: 100 mL
  Filled 2016-12-30: qty 100

## 2016-12-30 MED ORDER — ONDANSETRON 4 MG PO TBDP
4.0000 mg | ORAL_TABLET | Freq: Once | ORAL | Status: AC
  Administered 2016-12-30: 4 mg via ORAL

## 2016-12-30 MED ORDER — MORPHINE SULFATE (PF) 4 MG/ML IV SOLN
4.0000 mg | Freq: Once | INTRAVENOUS | Status: AC
  Administered 2016-12-30: 4 mg via INTRAVENOUS
  Filled 2016-12-30: qty 1

## 2016-12-30 MED ORDER — LACTATED RINGERS IV BOLUS (SEPSIS): 1000.0000 mL | Freq: Once | INTRAVENOUS | Status: DC

## 2016-12-30 MED ORDER — ONDANSETRON HCL 4 MG/2ML IJ SOLN
4.0000 mg | Freq: Once | INTRAMUSCULAR | Status: AC
  Administered 2016-12-30: 4 mg via INTRAVENOUS
  Filled 2016-12-30: qty 2

## 2016-12-30 NOTE — ED Provider Notes (Signed)
[facility] EMERGENCY DEPARTMENT Provider Note   CSN: [id number] Arrival date & time: 12/30/16  2051     History   Chief Complaint Chief Complaint  Patient presents with  . Abdominal Pain    HPI Jennifer Hogan is a 46 y.o. female.  The history is provided by the patient.  Abdominal Pain   This is a new problem. Episode onset: 3 days. The problem occurs constantly. Progression since onset: had pain and N/V on thursday, better on friday and sympotms started at 12am saturday morning. The pain is associated with eating. The pain is located in the RLQ and periumbilical region. The quality of the pain is aching and dull. The pain is moderate. Associated symptoms include anorexia, fever, nausea, vomiting and constipation. Pertinent negatives include diarrhea, dysuria, hematuria and arthralgias. The symptoms are aggravated by palpation and vomiting. Nothing relieves the symptoms.  Patient states that she woke up Thursday with multiple episodes of nonbilious nonbloody vomiting and inability to tolerate p.o. associated with periumbilical right lower quadrant pain.  Denies vaginal bleeding or discharge.  Denies dysuria, chest pain, shortness of breath or cough.  Symptoms started again this morning at 12 AM.  Went to urgent care and sent here for further evaluation.  History of appendectomy but no other abdominal surgeries.  She states that she does not remember the last time she had a normal bowel movement and does not believe she is passed gas since Wednesday.  Past Medical History:  Diagnosis Date  . Acute sinusitis 08/07/2015  . Allergic state 08/07/2015  . Anxiety   . Depression   . Diabetes mellitus   . High cholesterol   . Hypothyroidism     Patient Active Problem List   Diagnosis Date Noted  . Post-viral cough syndrome 07/03/2016  . Dysuria 12/07/2015  . Abnormal liver function test 11/23/2015  . Routine general medical examination at a health care facility  09/02/2015  . Allergic state 08/07/2015  . Vitiligo 07/09/2012  . Type 1 diabetes mellitus ([place]) 07/09/2012  . Depression 12/04/2010  . Dyslipidemia 12/04/2010  . Hypothyroidism 12/04/2010    Past Surgical History:  Procedure Laterality Date  . APPENDECTOMY      OB History    No data available       Home Medications    Prior to Admission medications   Medication Sig Start Date End Date Taking? Authorizing Provider  albuterol (PROVENTIL HFA;VENTOLIN HFA) 108 (90 Base) MCG/ACT inhaler Inhale 2 puffs into the lungs every 6 (six) hours as needed for wheezing or shortness of breath. 07/03/16   Golden Circle, FNP  atorvastatin (LIPITOR) 10 MG tablet Take 1 tablet (10 mg total) by mouth daily. 12/20/15   Hoyt Koch, MD  buPROPion (WELLBUTRIN XL) 300 MG 24 hr tablet TAKE ONE TABLET BY MOUTH IN THE MORNING 01/19/16   Hoyt Koch, MD  Continuous Glucose Monitor Sup (ENLITE GLUCOSE SENSOR) MISC 1 Device by Does not apply route once a week. 10/08/14   Renato Shin, MD  cyclobenzaprine (FLEXERIL) 10 MG tablet Take 1 tablet (10 mg total) by mouth at bedtime. 08/04/16   Nche, Charlene Brooke, NP  FLUoxetine (PROZAC) 10 MG tablet Take 3 tablets (30 mg total) by mouth daily. 02/22/16   Hoyt Koch, MD  glucose blood (BAYER CONTOUR NEXT TEST) test strip Test blood glucose 2-3 times a day. Dx code: 250.01 08/23/12   Renato Shin, MD  Insulin Infusion Pump Supplies (MINIMED INFUSION SET-MMT 399) MISC  1 Device by Does not apply route every 3 (three) days.    [provider]  Insulin Infusion Pump Supplies (PARADIGM RESERVOIR 3ML) MISC 1 Device by Does not apply route every 3 (three) days.    [provider]  insulin lispro (HUMALOG) 100 UNIT/ML injection 35 units per insulin pump daily. 12/22/15   Renato Shin, MD  levothyroxine (SYNTHROID, LEVOTHROID) 112 MCG tablet TAKE ONE TABLET BY MOUTH ONCE DAILY BEFORE  BREAKFAST 12/20/15   Hoyt Koch, MD   mometasone (NASONEX) 50 MCG/ACT nasal spray Place 2 sprays into the nose daily.    [provider]  montelukast (SINGULAIR) 10 MG tablet Take 1 tablet (10 mg total) by mouth at bedtime. 12/20/15   Hoyt Koch, MD  naproxen sodium (ANAPROX) 220 MG tablet Take 1 tablet (220 mg total) by mouth 3 (three) times daily with meals. 08/04/16   Nche, Charlene Brooke, NP  valsartan (DIOVAN) 40 MG tablet Take 1 tablet (40 mg total) by mouth daily. 01/26/14   Renato Shin, MD    Family History Family History  Problem Relation Age of Onset  . Coronary artery disease Father   . Cancer Mother        T cell lymphoma    Social History Social History   Tobacco Use  . Smoking status: Never Smoker  . Smokeless tobacco: Never Used  Substance Use Topics  . Alcohol use: No    Alcohol/week: 0.0 oz  . Drug use: No     Allergies   Ciprofloxin hcl [ciprofloxacin] and Food color red [red dye]   Review of Systems Review of Systems  Constitutional: Positive for fever. Negative for chills.  HENT: Negative for ear pain and sore throat.   Eyes: Negative for pain and visual disturbance.  Respiratory: Negative for cough and shortness of breath.   Cardiovascular: Negative for chest pain and palpitations.  Gastrointestinal: Positive for abdominal pain, anorexia, constipation, nausea and vomiting. Negative for diarrhea.  Genitourinary: Negative for dysuria and hematuria.  Musculoskeletal: Negative for arthralgias, back pain and neck pain.  Skin: Negative for color change and rash.  Neurological: Negative for seizures and syncope.  All other systems reviewed and are negative.    Physical Exam Updated Vital Signs BP (!) 179/94 (BP Location: Left Arm)   Pulse (!) 106   Temp 99.9 F (37.7 C) (Oral)   Resp 20   Ht 5\' 5"  (1.651 m)   Wt 72.6 kg (160 lb)   SpO2 100%   BMI 26.63 kg/m   Physical Exam  Constitutional: She appears well-developed and well-nourished. No distress.  HENT:    Head: Normocephalic and atraumatic.  Mouth/Throat: Mucous membranes are dry.  Eyes: Conjunctivae and EOM are normal. Pupils are equal, round, and reactive to light.  Neck: Normal range of motion and full passive range of motion without pain. Neck supple. No neck rigidity.  Cardiovascular: Regular rhythm, normal heart sounds, intact distal pulses and normal pulses. Tachycardia present.  No murmur heard. Pulmonary/Chest: Effort normal and breath sounds normal. No respiratory distress.  Abdominal: Soft. She exhibits no distension. There is tenderness in the right lower quadrant and periumbilical area. There is guarding. There is no rigidity, no rebound, no CVA tenderness, no tenderness at McBurney's point and negative Murphy's sign.  Musculoskeletal: She exhibits no edema.  Neurological: She is alert.  Skin: Skin is warm and dry. Capillary refill takes less than 2 seconds.  Psychiatric: She has a normal mood and affect.  Nursing note  and vitals reviewed.    ED Treatments / Results  Labs (all labs ordered are listed, but only abnormal results are displayed) Labs Reviewed  COMPREHENSIVE METABOLIC PANEL - Abnormal; Notable for the following components:      Result Value   Sodium 131 (*)    Potassium 3.2 (*)    Chloride 94 (*)    Glucose, Bld 184 (*)    All other components within normal limits  URINALYSIS, ROUTINE W REFLEX MICROSCOPIC - Abnormal; Notable for the following components:   Glucose, UA >=500 (*)    Hgb urine dipstick SMALL (*)    Ketones, ur 80 (*)    Protein, ur 30 (*)    Bacteria, UA RARE (*)    Squamous Epithelial / LPF 0-5 (*)    All other components within normal limits  LIPASE, BLOOD  CBC  I-STAT BETA HCG BLOOD, ED ([facility], [facility], [facility] ONLY)    EKG  EKG Interpretation None       Radiology No results found.  Procedures Procedures (including critical care time)  Medications Ordered in ED Medications  iopamidol (ISOVUE-300) 61 % injection (not administered)   lactated ringers bolus 1,000 mL (1,000 mLs Intravenous New Bag/Given 12/30/16 2203)  ondansetron (ZOFRAN) injection 4 mg (4 mg Intravenous Given 12/30/16 2224)  morphine 4 MG/ML injection 4 mg (4 mg Intravenous Given 12/30/16 2314)     Initial Impression / Assessment and Plan / ED Course  I have reviewed the triage vital signs and the nursing notes.  Pertinent labs & imaging results that were available during my care of the patient were reviewed by me and considered in my medical decision making (see chart for details).     46 year old female with a history of diabetes presenting with 3 days of nausea, vomiting, abdominal pain.  She is found to be febrile, tachycardic but normotensive and in no distress.  Has mild right lower quadrant and periumbilical pain.  Exam is as above.  Denies dysuria, vaginal bleeding, vaginal discharge, cough, shortness of breath.  Denies neck pain, alert and oriented x3, no signs of meningismus.  Lungs clear to auscultation bilaterally with no cough or sputum production, doubt pneumonia patient states she has had an appendectomy but no other surgeries.  Has had multiple episodes of nausea vomiting therefore Zofran given.  As she has abdominal pain and states that she is unsure if she has passed gas since 05-19-22, CT abdomen with contrast ordered to assess for obstruction vs diverticulitis vs colitis.  CBC, CMP, lipase and UA ordered in triage.  Fluids given for dehydration.    CBC unremarkable with no leukocytosis.  CMP with a glucose of 184 no signs of DKA.  Lipase normal.  UA with rare bacteria no leukocytes and no white blood cells and patient has no dysuria.  If CT is unremarkable patient likely has benign gastritis and can be treated with nausea medicine.  Final Clinical Impressions(s) / ED Diagnoses   Final diagnoses:  None    ED Discharge Orders    None       Soul Hackman [place], MD 12/31/16 0007    Forde Dandy, MD 12/31/16 1400

## 2016-12-30 NOTE — ED Provider Notes (Signed)
[facility]    CSN: [id number] Arrival date & time: 12/30/16  1843     History   Chief Complaint Chief Complaint  Patient presents with  . Emesis  . Abdominal Pain    HPI Jennifer Hogan is a 46 y.o. female.   Who is IDDM presents with abdominal pain, fever and N, V. She started feeling poorly 2 days ago with  N, V and generalized abdominal pain. She has not eaten and has not been able to keep fluids down. She has no diarrhea, bloody stools, urinary symptoms. She denies abdominal surgery.       Past Medical History:  Diagnosis Date  . Acute sinusitis 08/07/2015  . Allergic state 08/07/2015  . Anxiety   . Depression   . Diabetes mellitus   . High cholesterol   . Hypothyroidism     Patient Active Problem List   Diagnosis Date Noted  . Post-viral cough syndrome 07/03/2016  . Dysuria 12/07/2015  . Abnormal liver function test 11/23/2015  . Routine general medical examination at a health care facility 09/02/2015  . Allergic state 08/07/2015  . Vitiligo 07/09/2012  . Type 1 diabetes mellitus ([place]) 07/09/2012  . Depression 12/04/2010  . Dyslipidemia 12/04/2010  . Hypothyroidism 12/04/2010    Past Surgical History:  Procedure Laterality Date  . APPENDECTOMY      OB History    No data available       Home Medications    Prior to Admission medications   Medication Sig Start Date End Date Taking? Authorizing Provider  albuterol (PROVENTIL HFA;VENTOLIN HFA) 108 (90 Base) MCG/ACT inhaler Inhale 2 puffs into the lungs every 6 (six) hours as needed for wheezing or shortness of breath. 07/03/16   Golden Circle, FNP  atorvastatin (LIPITOR) 10 MG tablet Take 1 tablet (10 mg total) by mouth daily. 12/20/15   Hoyt Koch, MD  buPROPion (WELLBUTRIN XL) 300 MG 24 hr tablet TAKE ONE TABLET BY MOUTH IN THE MORNING 01/19/16   Hoyt Koch, MD  Continuous Glucose Monitor Sup (ENLITE GLUCOSE SENSOR) MISC 1 Device by Does not apply route once  a week. 10/08/14   Renato Shin, MD  cyclobenzaprine (FLEXERIL) 10 MG tablet Take 1 tablet (10 mg total) by mouth at bedtime. 08/04/16   Nche, Charlene Brooke, NP  FLUoxetine (PROZAC) 10 MG tablet Take 3 tablets (30 mg total) by mouth daily. 02/22/16   Hoyt Koch, MD  glucose blood (BAYER CONTOUR NEXT TEST) test strip Test blood glucose 2-3 times a day. Dx code: 250.01 08/23/12   Renato Shin, MD  Insulin Infusion Pump Supplies (MINIMED INFUSION SET-MMT 399) MISC 1 Device by Does not apply route every 3 (three) days.    [provider]  Insulin Infusion Pump Supplies (PARADIGM RESERVOIR 3ML) MISC 1 Device by Does not apply route every 3 (three) days.    [provider]  insulin lispro (HUMALOG) 100 UNIT/ML injection 35 units per insulin pump daily. 12/22/15   Renato Shin, MD  levothyroxine (SYNTHROID, LEVOTHROID) 112 MCG tablet TAKE ONE TABLET BY MOUTH ONCE DAILY BEFORE  BREAKFAST 12/20/15   Hoyt Koch, MD  mometasone (NASONEX) 50 MCG/ACT nasal spray Place 2 sprays into the nose daily.    [provider]  montelukast (SINGULAIR) 10 MG tablet Take 1 tablet (10 mg total) by mouth at bedtime. 12/20/15   Hoyt Koch, MD  naproxen sodium (ANAPROX) 220 MG tablet Take 1 tablet (220 mg total) by mouth 3 (  three) times daily with meals. 08/04/16   Nche, Charlene Brooke, NP  valsartan (DIOVAN) 40 MG tablet Take 1 tablet (40 mg total) by mouth daily. 01/26/14   Renato Shin, MD    Family History Family History  Problem Relation Age of Onset  . Coronary artery disease Father   . Cancer Mother        T cell lymphoma    Social History Social History   Tobacco Use  . Smoking status: Never Smoker  . Smokeless tobacco: Never Used  Substance Use Topics  . Alcohol use: No    Alcohol/week: 0.0 oz  . Drug use: No     Allergies   Ciprofloxin hcl [ciprofloxacin] and Food color red [red dye]   Review of Systems Review of Systems  Constitutional:  Positive for chills, fatigue and fever.  Gastrointestinal: Positive for abdominal pain, nausea and vomiting.     Physical Exam Triage Vital Signs ED Triage Vitals  Enc Vitals Group     BP 12/30/16 1956 (!) 172/89     Pulse Rate 12/30/16 1956 (!) 113     Resp 12/30/16 1956 17     Temp 12/30/16 1956 (!) 101.2 F (38.4 C)     Temp Source 12/30/16 1956 Oral     SpO2 12/30/16 1956 98 %     Weight --      Height --      Head Circumference --      Peak Flow --      Pain Score 12/30/16 1957 6     Pain Loc --      Pain Edu? --      Excl. in [place]? --    No data found.  Updated Vital Signs BP (!) 172/89 (BP Location: Right Arm)   Pulse (!) 113   Temp (!) 101.2 F (38.4 C) (Oral)   Resp 17   SpO2 98%   Visual Acuity Right Eye Distance:   Left Eye Distance:   Bilateral Distance:    Right Eye Near:   Left Eye Near:    Bilateral Near:     Physical Exam  Constitutional:  Patient kneeled in corner of room when I entered exam room. She is pale and appears in some distress secondary to pain  Abdominal: Bowel sounds are normal. She exhibits no distension, no fluid wave and no mass. There is no splenomegaly. There is generalized tenderness and tenderness in the right lower quadrant. There is rebound. There is no tenderness at McBurney's point and negative Murphy's sign.  Skin: Skin is warm and dry.  Nursing note and vitals reviewed.    UC Treatments / Results  Labs (all labs ordered are listed, but only abnormal results are displayed) Labs Reviewed - No data to display  EKG  EKG Interpretation None       Radiology No results found.  Procedures Procedures (including critical care time)  Medications Ordered in UC Medications  acetaminophen (TYLENOL) tablet 650 mg (650 mg Oral Given 12/30/16 2011)  ondansetron (ZOFRAN-ODT) disintegrating tablet 4 mg (4 mg Oral Given 12/30/16 2011)     Initial Impression / Assessment and Plan / UC Course  I have reviewed the triage  vital signs and the nursing notes.  Pertinent labs & imaging results that were available during my care of the patient were reviewed by me and considered in my medical decision making (see chart for details).   Patient with moderate abdominal pain, fever, and fluid depletion. She is hypertensive. Instructed  to be seen in the ED for fluids and source of abdominal pain and fever. Will most likely need a scan. She agrees to go.  Final Clinical Impressions(s) / UC Diagnoses   Final diagnoses:  None    ED Discharge Orders    None       Controlled Substance Prescriptions [place] Controlled Substance Registry consulted? Not Applicable   Prudencio Pair 12/30/16 2046

## 2016-12-30 NOTE — ED Notes (Signed)
istat beta hcg blood test NEGATIVE...resulted at 21:12,  Did not cross over in [facility]

## 2016-12-30 NOTE — Discharge Instructions (Signed)
Please go to the ED for a full evaluation and fluids and identification of where your fever is coming from

## 2016-12-30 NOTE — ED Triage Notes (Addendum)
Patient reports vomiting started on Thursday was starting to feel better yesterday but then started having vomiting again today. Reports generalized abdominal pain with nausea. Denies diarrhea. States she has had fever but has not taken any meds. Patient is diabetic and states CBGs were in the 300s but was able to bring down with fluids. CBG 173 at this time.

## 2016-12-30 NOTE — ED Notes (Addendum)
Pt states that she has been having episodes of vomiting since Thurs; stated that she felt lil better yesterday but started feeling worse again today; vomiting bile and had temp. Pt states that she is nauseous and has diffused abd pain. Pt states that BS have been elevated in 400s, but have since come down; CBG this evening 180.

## 2016-12-30 NOTE — ED Provider Notes (Signed)
I saw and evaluated the patient, reviewed the resident's note and I agree with the findings and plan.   EKG Interpretation None      46 year old female with history of diabetes and insulin pump who presents with lower abdominal pain, nausea and vomiting.  She has a history of appendectomy.  Reports 4 days of intermittent nausea vomiting and lower abdominal pain.  No diarrhea but has had constipation.  Denies any urinary complaints.  Is febrile 101.4 Fahrenheit and tachycardic with heart rate in the 110 on arrival.  Has a non-peritoneal abdomen with generalized abdominal pain to palpation.  Blood work shows no signs of DKA.  No leukocytosis.  UA without infection.  Plan for CT abdomen pelvis to evaluate for intra-abdominal infection such as colitis, diverticulitis, or other serious processes.   Forde Dandy, MD 12/30/16 [phone]

## 2016-12-30 NOTE — ED Triage Notes (Signed)
Pt reports lower abd pain, N/V X4 days, went to [facility] and was sent here for further eval. States her sugars have been running high for past several days, currently 173.

## 2016-12-31 ENCOUNTER — Other Ambulatory Visit (HOSPITAL_COMMUNITY): Payer: Self-pay

## 2016-12-31 ENCOUNTER — Emergency Department (HOSPITAL_COMMUNITY): Payer: Self-pay

## 2016-12-31 MED ORDER — ONDANSETRON 8 MG PO TBDP: ORAL_TABLET | ORAL | 0 refills | Status: DC

## 2016-12-31 MED ORDER — LACTATED RINGERS IV BOLUS (SEPSIS)
1000.0000 mL | Freq: Once | INTRAVENOUS | Status: AC
  Administered 2016-12-31: 1000 mL via INTRAVENOUS

## 2016-12-31 NOTE — ED Notes (Signed)
Patient transported to CT 

## 2016-12-31 NOTE — ED Notes (Signed)
Pt discharged from ED; instructions provided and scripts given; Pt encouraged to return to ED if symptoms worsen and to f/u with PCP; Pt verbalized understanding of all instructions 

## 2017-01-01 LAB — I-STAT BETA HCG BLOOD, ED (MC, WL, AP ONLY)

## 2017-01-14 ENCOUNTER — Other Ambulatory Visit: Payer: Self-pay | Admitting: Internal Medicine

## 2017-01-17 ENCOUNTER — Encounter: Payer: Self-pay | Admitting: Internal Medicine

## 2017-01-17 ENCOUNTER — Ambulatory Visit (INDEPENDENT_AMBULATORY_CARE_PROVIDER_SITE_OTHER): Payer: Self-pay | Admitting: Internal Medicine

## 2017-01-17 DIAGNOSIS — E039 Hypothyroidism, unspecified: Secondary | ICD-10-CM

## 2017-01-17 DIAGNOSIS — E1042 Type 1 diabetes mellitus with diabetic polyneuropathy: Secondary | ICD-10-CM

## 2017-01-17 DIAGNOSIS — F3341 Major depressive disorder, recurrent, in partial remission: Secondary | ICD-10-CM

## 2017-01-17 MED ORDER — LEVOTHYROXINE SODIUM 112 MCG PO TABS: 112.0000 ug | ORAL_TABLET | Freq: Every day | ORAL | 1 refills | Status: DC

## 2017-01-17 MED ORDER — INSULIN LISPRO 100 UNIT/ML ~~LOC~~ SOLN: SUBCUTANEOUS | 11 refills | Status: DC

## 2017-01-17 MED ORDER — ALBUTEROL SULFATE HFA 108 (90 BASE) MCG/ACT IN AERS: 2.0000 | INHALATION_SPRAY | Freq: Four times a day (QID) | RESPIRATORY_TRACT | 6 refills | Status: DC | PRN

## 2017-01-17 MED ORDER — MELOXICAM 15 MG PO TABS: 15.0000 mg | ORAL_TABLET | Freq: Every day | ORAL | 3 refills | Status: DC

## 2017-01-17 MED ORDER — MONTELUKAST SODIUM 10 MG PO TABS: 10.0000 mg | ORAL_TABLET | Freq: Every day | ORAL | 3 refills | Status: DC

## 2017-01-17 NOTE — Progress Notes (Signed)
   Subjective:    [name], female    DOB: [birth year], 46 y.o.   MRN: [id number]  HPI The patient is a 46 YO female coming in for ER follow up (seen for abdominal pain and constipation, CT and labs without etiology, sugars were up some during that time due to the holidays). She has been without insurance for some time, has been still using her insulin pump and monitoring sugars. She had been straying from her diet and using more insulin to compensate. She is now back on track. Has been taking some mineral oil daily to help clear her constipation and feels that is normal again. She is struggling without insurance as her insulin is almost 200 dollars per month. She denies fevers or chills. Cannot afford labs today after ER visit. Denies chest pains, SOB, abdominal pain. Denies nausea or vomiting or diarrhea or constipation. No blood in stool. She is working several part time jobs and looking for other employment.   PMH, [facility], social history reviewed and updated.   Review of Systems  Constitutional: Positive for activity change and appetite change. Negative for chills, fatigue, fever and unexpected weight change.  HENT: Negative.   Eyes: Negative.   Respiratory: Negative for cough, chest tightness and shortness of breath.   Cardiovascular: Negative for chest pain, palpitations and leg swelling.  Gastrointestinal: Negative for abdominal distention, abdominal pain, constipation, diarrhea, nausea and vomiting.  Musculoskeletal: Positive for back pain and myalgias.  Skin: Negative.   Neurological: Negative.   Psychiatric/Behavioral: Negative.       Objective:   Physical Exam  Constitutional: She is oriented to person, place, and time. She appears well-developed and well-nourished.  HENT:  Head: Normocephalic and atraumatic.  Eyes: EOM are normal.  Neck: Normal range of motion.  Cardiovascular: Normal rate and regular rhythm.  Pulmonary/Chest: Effort normal and breath sounds  normal. No respiratory distress. She has no wheezes. She has no rales.  Abdominal: Soft. Bowel sounds are normal. She exhibits no distension. There is no tenderness. There is no rebound.  Musculoskeletal: She exhibits no edema.  Neurological: She is alert and oriented to person, place, and time. Coordination normal.  Skin: Skin is warm and dry.  Psychiatric: She has a normal mood and affect.   Vitals:   01/17/17 1011  BP: 114/80  Pulse: 81  Temp: 98.7 F (37.1 C)  TempSrc: Oral  SpO2: 99%  Weight: 165 lb (74.8 kg)  Height: 5\' 5"  (1.651 m)      Assessment & Plan:

## 2017-01-17 NOTE — Patient Instructions (Addendum)
Think about getting the [employer] meter as the strips are a lot cheaper.   We have sent in the meloxicam.  Back Exercises The following exercises strengthen the muscles that help to support the back. They also help to keep the lower back flexible. Doing these exercises can help to prevent back pain or lessen existing pain. If you have back pain or discomfort, try doing these exercises 2-3 times each day or as told by your health care provider. When the pain goes away, do them once each day, but increase the number of times that you repeat the steps for each exercise (do more repetitions). If you do not have back pain or discomfort, do these exercises once each day or as told by your health care provider. Exercises Single Knee to Chest  Repeat these steps 3-5 times for each leg: 1. Lie on your back on a firm bed or the floor with your legs extended. 2. Bring one knee to your chest. Your other leg should stay extended and in contact with the floor. 3. Hold your knee in place by grabbing your knee or thigh. 4. Pull on your knee until you feel a gentle stretch in your lower back. 5. Hold the stretch for 10-30 seconds. 6. Slowly release and straighten your leg.  Pelvic Tilt  Repeat these steps 5-10 times: 1. Lie on your back on a firm bed or the floor with your legs extended. 2. Bend your knees so they are pointing toward the ceiling and your feet are flat on the floor. 3. Tighten your lower abdominal muscles to press your lower back against the floor. This motion will tilt your pelvis so your tailbone points up toward the ceiling instead of pointing to your feet or the floor. 4. With gentle tension and even breathing, hold this position for 5-10 seconds.  Cat-Cow  Repeat these steps until your lower back becomes more flexible: 1. Get into a hands-and-knees position on a firm surface. Keep your hands under your shoulders, and keep your knees under your hips. You may place padding under your  knees for comfort. 2. Let your head hang down, and point your tailbone toward the floor so your lower back becomes rounded like the back of a cat. 3. Hold this position for 5 seconds. 4. Slowly lift your head and point your tailbone up toward the ceiling so your back forms a sagging arch like the back of a cow. 5. Hold this position for 5 seconds.  Press-Ups  Repeat these steps 5-10 times: 1. Lie on your abdomen (face-down) on the floor. 2. Place your palms near your head, about shoulder-width apart. 3. While you keep your back as relaxed as possible and keep your hips on the floor, slowly straighten your arms to raise the top half of your body and lift your shoulders. Do not use your back muscles to raise your upper torso. You may adjust the placement of your hands to make yourself more comfortable. 4. Hold this position for 5 seconds while you keep your back relaxed. 5. Slowly return to lying flat on the floor.  Bridges  Repeat these steps 10 times: 1. Lie on your back on a firm surface. 2. Bend your knees so they are pointing toward the ceiling and your feet are flat on the floor. 3. Tighten your buttocks muscles and lift your buttocks off of the floor until your waist is at almost the same height as your knees. You should feel the muscles working in  your buttocks and the back of your thighs. If you do not feel these muscles, slide your feet 1-2 inches farther away from your buttocks. 4. Hold this position for 3-5 seconds. 5. Slowly lower your hips to the starting position, and allow your buttocks muscles to relax completely.  If this exercise is too easy, try doing it with your arms crossed over your chest. Abdominal Crunches  Repeat these steps 5-10 times: 1. Lie on your back on a firm bed or the floor with your legs extended. 2. Bend your knees so they are pointing toward the ceiling and your feet are flat on the floor. 3. Cross your arms over your chest. 4. Tip your chin  slightly toward your chest without bending your neck. 5. Tighten your abdominal muscles and slowly raise your trunk (torso) high enough to lift your shoulder blades a tiny bit off of the floor. Avoid raising your torso higher than that, because it can put too much stress on your low back and it does not help to strengthen your abdominal muscles. 6. Slowly return to your starting position.  Back Lifts Repeat these steps 5-10 times: 1. Lie on your abdomen (face-down) with your arms at your sides, and rest your forehead on the floor. 2. Tighten the muscles in your legs and your buttocks. 3. Slowly lift your chest off of the floor while you keep your hips pressed to the floor. Keep the back of your head in line with the curve in your back. Your eyes should be looking at the floor. 4. Hold this position for 3-5 seconds. 5. Slowly return to your starting position.  Contact a health care provider if:  Your back pain or discomfort gets much worse when you do an exercise.  Your back pain or discomfort does not lessen within 2 hours after you exercise. If you have any of these problems, stop doing these exercises right away. Do not do them again unless your health care provider says that you can. Get help right away if:  You develop sudden, severe back pain. If this happens, stop doing the exercises right away. Do not do them again unless your health care provider says that you can. This information is not intended to replace advice given to you by your health care provider. Make sure you discuss any questions you have with your health care provider. Document Released: 02/17/2004 Document Revised: 05/19/2015 Document Reviewed: 03/05/2014 Elsevier Interactive Patient Education  2017 [employer].

## 2017-01-18 NOTE — Assessment & Plan Note (Addendum)
Has not seen endo in some time and using insulin pump. Have advised that I will refill insulin until she has insurance but then she needs to return to endo and she agrees. She is monitoring sugars. Complicated by some neuropathy which sounds some worse lately. She could possibly have some gastroparesis clinically from this recent episode with her stomach with vomiting and constipation.

## 2017-01-18 NOTE — Assessment & Plan Note (Signed)
Will refill meds for 6 months and needs labs then. Taking synthroid 112 mcg daily.

## 2017-01-18 NOTE — Assessment & Plan Note (Signed)
She is struggling lately with her change in job but is managing. Denies SI/HI and taking prozac 30 mg daily and wants to increase to 40 mg daily which is fine. She is not taking wellbutrin right now.

## 2017-01-23 DIAGNOSIS — I219 Acute myocardial infarction, unspecified: Secondary | ICD-10-CM

## 2017-01-23 HISTORY — DX: Acute myocardial infarction, unspecified: I21.9

## 2017-02-01 ENCOUNTER — Ambulatory Visit: Payer: Self-pay | Admitting: Internal Medicine

## 2017-03-15 ENCOUNTER — Encounter (HOSPITAL_COMMUNITY): Payer: Self-pay

## 2017-03-15 ENCOUNTER — Observation Stay (HOSPITAL_COMMUNITY)
Admission: EM | Admit: 2017-03-15 | Discharge: 2017-03-16 | Disposition: A | Discharge status: Home / Self Care | Payer: Self-pay | Attending: Internal Medicine | Admitting: Internal Medicine

## 2017-03-15 ENCOUNTER — Other Ambulatory Visit: Payer: Self-pay

## 2017-03-15 ENCOUNTER — Emergency Department (HOSPITAL_COMMUNITY): Payer: Self-pay

## 2017-03-15 DIAGNOSIS — R000 Tachycardia, unspecified: Secondary | ICD-10-CM | POA: Insufficient documentation

## 2017-03-15 DIAGNOSIS — F329 Major depressive disorder, single episode, unspecified: Secondary | ICD-10-CM | POA: Insufficient documentation

## 2017-03-15 DIAGNOSIS — E7800 Pure hypercholesterolemia, unspecified: Secondary | ICD-10-CM | POA: Insufficient documentation

## 2017-03-15 DIAGNOSIS — I429 Cardiomyopathy, unspecified: Secondary | ICD-10-CM | POA: Insufficient documentation

## 2017-03-15 DIAGNOSIS — Z9641 Presence of insulin pump (external) (internal): Secondary | ICD-10-CM | POA: Insufficient documentation

## 2017-03-15 DIAGNOSIS — E860 Dehydration: Secondary | ICD-10-CM

## 2017-03-15 DIAGNOSIS — R1110 Vomiting, unspecified: Secondary | ICD-10-CM | POA: Insufficient documentation

## 2017-03-15 DIAGNOSIS — E1065 Type 1 diabetes mellitus with hyperglycemia: Secondary | ICD-10-CM | POA: Insufficient documentation

## 2017-03-15 DIAGNOSIS — E785 Hyperlipidemia, unspecified: Secondary | ICD-10-CM | POA: Insufficient documentation

## 2017-03-15 DIAGNOSIS — B349 Viral infection, unspecified: Secondary | ICD-10-CM

## 2017-03-15 DIAGNOSIS — D72829 Elevated white blood cell count, unspecified: Secondary | ICD-10-CM

## 2017-03-15 DIAGNOSIS — F419 Anxiety disorder, unspecified: Secondary | ICD-10-CM | POA: Insufficient documentation

## 2017-03-15 DIAGNOSIS — R112 Nausea with vomiting, unspecified: Secondary | ICD-10-CM | POA: Diagnosis present

## 2017-03-15 DIAGNOSIS — E039 Hypothyroidism, unspecified: Secondary | ICD-10-CM | POA: Insufficient documentation

## 2017-03-15 DIAGNOSIS — Z79890 Hormone replacement therapy (postmenopausal): Secondary | ICD-10-CM | POA: Insufficient documentation

## 2017-03-15 DIAGNOSIS — R6510 Systemic inflammatory response syndrome (SIRS) of non-infectious origin without acute organ dysfunction: Secondary | ICD-10-CM | POA: Insufficient documentation

## 2017-03-15 DIAGNOSIS — N179 Acute kidney failure, unspecified: Secondary | ICD-10-CM | POA: Insufficient documentation

## 2017-03-15 DIAGNOSIS — R197 Diarrhea, unspecified: Principal | ICD-10-CM | POA: Insufficient documentation

## 2017-03-15 DIAGNOSIS — Z79899 Other long term (current) drug therapy: Secondary | ICD-10-CM | POA: Insufficient documentation

## 2017-03-15 DIAGNOSIS — R9431 Abnormal electrocardiogram [ECG] [EKG]: Secondary | ICD-10-CM | POA: Insufficient documentation

## 2017-03-15 DIAGNOSIS — N289 Disorder of kidney and ureter, unspecified: Secondary | ICD-10-CM

## 2017-03-15 DIAGNOSIS — Z791 Long term (current) use of non-steroidal anti-inflammatories (NSAID): Secondary | ICD-10-CM | POA: Insufficient documentation

## 2017-03-15 LAB — COMPREHENSIVE METABOLIC PANEL
ALT: 26 U/L (ref 14–54)
AST: 31 U/L (ref 15–41)
Albumin: 4.7 g/dL (ref 3.5–5.0)
Alkaline Phosphatase: 77 U/L (ref 38–126)
Anion gap: 17 — ABNORMAL HIGH (ref 5–15)
BILIRUBIN TOTAL: 1.2 mg/dL (ref 0.3–1.2)
BUN: 22 mg/dL — AB (ref 6–20)
CALCIUM: 9.8 mg/dL (ref 8.9–10.3)
CO2: 15 mmol/L — ABNORMAL LOW (ref 22–32)
CREATININE: 1.51 mg/dL — AB (ref 0.44–1.00)
Chloride: 102 mmol/L (ref 101–111)
GFR calc Af Amer: 46 mL/min — ABNORMAL LOW (ref >60)
GFR, EST NON AFRICAN AMERICAN: 40 mL/min — AB (ref >60)
Glucose, Bld: 381 mg/dL — ABNORMAL HIGH (ref 65–99)
POTASSIUM: 4.5 mmol/L (ref 3.5–5.1)
Sodium: 134 mmol/L — ABNORMAL LOW (ref 135–145)
TOTAL PROTEIN: 8.4 g/dL — AB (ref 6.5–8.1)

## 2017-03-15 LAB — URINALYSIS, ROUTINE W REFLEX MICROSCOPIC
Bacteria, UA: NONE SEEN
Bilirubin Urine: NEGATIVE
Glucose, UA: >=500 mg/dL — AB
Ketones, ur: 80 mg/dL — AB
Leukocytes, UA: NEGATIVE
Nitrite: NEGATIVE
PROTEIN: 30 mg/dL — AB
SPECIFIC GRAVITY, URINE: 1.018 (ref 1.005–1.030)
pH: 5 (ref 5.0–8.0)

## 2017-03-15 LAB — CBC WITH DIFFERENTIAL/PLATELET
BASOS PCT: 0 %
Basophils Absolute: 0 10*3/uL (ref 0.0–0.1)
EOS ABS: 0 10*3/uL (ref 0.0–0.7)
EOS PCT: 0 %
HCT: 39.8 % (ref 36.0–46.0)
Hemoglobin: 13.6 g/dL (ref 12.0–15.0)
Lymphocytes Relative: 6 %
Lymphs Abs: 0.9 10*3/uL (ref 0.7–4.0)
MCH: 30.9 pg (ref 26.0–34.0)
MCHC: 34.2 g/dL (ref 30.0–36.0)
MCV: 90.5 fL (ref 78.0–100.0)
MONO ABS: 0.4 10*3/uL (ref 0.1–1.0)
Monocytes Relative: 3 %
Neutro Abs: 13.8 10*3/uL — ABNORMAL HIGH (ref 1.7–7.7)
Neutrophils Relative %: 91 %
PLATELETS: 538 10*3/uL — AB (ref 150–400)
RBC: 4.4 MIL/uL (ref 3.87–5.11)
RDW: 12.9 % (ref 11.5–15.5)
WBC: 15.1 10*3/uL — ABNORMAL HIGH (ref 4.0–10.5)

## 2017-03-15 LAB — I-STAT CHEM 8, ED
BUN: 24 mg/dL — ABNORMAL HIGH (ref 6–20)
BUN: 25 mg/dL — AB (ref 6–20)
CREATININE: 0.8 mg/dL (ref 0.44–1.00)
Calcium, Ion: 1.17 mmol/L (ref 1.15–1.40)
Calcium, Ion: 1.18 mmol/L (ref 1.15–1.40)
Chloride: 105 mmol/L (ref 101–111)
Chloride: 110 mmol/L (ref 101–111)
Creatinine, Ser: 1.2 mg/dL — ABNORMAL HIGH (ref 0.44–1.00)
Glucose, Bld: 360 mg/dL — ABNORMAL HIGH (ref 65–99)
Glucose, Bld: 293 mg/dL — ABNORMAL HIGH (ref 65–99)
HEMATOCRIT: 43 % (ref 36.0–46.0)
HEMATOCRIT: 38 % (ref 36.0–46.0)
HEMOGLOBIN: 14.6 g/dL (ref 12.0–15.0)
HEMOGLOBIN: 12.9 g/dL (ref 12.0–15.0)
POTASSIUM: 4.5 mmol/L (ref 3.5–5.1)
POTASSIUM: 4.4 mmol/L (ref 3.5–5.1)
SODIUM: 140 mmol/L (ref 135–145)
Sodium: 136 mmol/L (ref 135–145)
TCO2: 18 mmol/L — AB (ref 22–32)
TCO2: 17 mmol/L — AB (ref 22–32)

## 2017-03-15 LAB — CBG MONITORING, ED
Glucose-Capillary: 404 mg/dL — ABNORMAL HIGH (ref 65–99)
Glucose-Capillary: 279 mg/dL — ABNORMAL HIGH (ref 65–99)

## 2017-03-15 LAB — I-STAT VENOUS BLOOD GAS, ED
Acid-base deficit: 10 mmol/L — ABNORMAL HIGH (ref 0.0–2.0)
BICARBONATE: 15.6 mmol/L — AB (ref 20.0–28.0)
O2 Saturation: 69 %
PCO2 VEN: 32.6 mmHg — AB (ref 44.0–60.0)
PH VEN: 7.289 (ref 7.250–7.430)
PO2 VEN: 39 mmHg (ref 32.0–45.0)
TCO2: 17 mmol/L — AB (ref 22–32)

## 2017-03-15 LAB — I-STAT CG4 LACTIC ACID, ED
Lactic Acid, Venous: 1.59 mmol/L (ref 0.5–1.9)
Lactic Acid, Venous: 1.38 mmol/L (ref 0.5–1.9)

## 2017-03-15 LAB — INFLUENZA PANEL BY PCR (TYPE A & B)
INFLBPCR: NEGATIVE
Influenza A By PCR: NEGATIVE

## 2017-03-15 LAB — LIPASE, BLOOD: LIPASE: 19 U/L (ref 11–51)

## 2017-03-15 LAB — I-STAT BETA HCG BLOOD, ED (MC, WL, AP ONLY)

## 2017-03-15 MED ORDER — PANTOPRAZOLE SODIUM 40 MG IV SOLR
40.0000 mg | INTRAVENOUS | Status: DC
  Administered 2017-03-15: 40 mg via INTRAVENOUS
  Filled 2017-03-15 (×2): qty 40

## 2017-03-15 MED ORDER — MORPHINE SULFATE (PF) 4 MG/ML IV SOLN
4.0000 mg | Freq: Once | INTRAVENOUS | Status: AC
  Administered 2017-03-15: 4 mg via INTRAVENOUS

## 2017-03-15 MED ORDER — SODIUM CHLORIDE 0.9 % IV BOLUS (SEPSIS)
1000.0000 mL | Freq: Once | INTRAVENOUS | Status: AC
  Administered 2017-03-15: 1000 mL via INTRAVENOUS

## 2017-03-15 MED ORDER — ONDANSETRON HCL 4 MG/2ML IJ SOLN
4.0000 mg | Freq: Once | INTRAMUSCULAR | Status: AC
  Administered 2017-03-15: 4 mg via INTRAVENOUS
  Filled 2017-03-15: qty 2

## 2017-03-15 MED ORDER — SODIUM CHLORIDE 0.9 % IV BOLUS (SEPSIS)
2000.0000 mL | Freq: Once | INTRAVENOUS | Status: AC
  Administered 2017-03-15: 2000 mL via INTRAVENOUS

## 2017-03-15 MED ORDER — ONDANSETRON 4 MG PO TBDP
4.0000 mg | ORAL_TABLET | Freq: Once | ORAL | Status: AC
  Administered 2017-03-15: 4 mg via ORAL
  Filled 2017-03-15: qty 1

## 2017-03-15 MED ORDER — MORPHINE SULFATE (PF) 4 MG/ML IV SOLN
0.5000 mg | Freq: Once | INTRAVENOUS | Status: DC
  Filled 2017-03-15: qty 1

## 2017-03-15 MED ORDER — LACTATED RINGERS IV SOLN
INTRAVENOUS | Status: DC
  Administered 2017-03-16: 125 mL via INTRAVENOUS
  Administered 2017-03-16: 1000 mL via INTRAVENOUS

## 2017-03-15 NOTE — ED Notes (Signed)
Pt CBG was 404, notified Lynzze(RN)

## 2017-03-15 NOTE — ED Notes (Signed)
Dr. Little at bedside.  

## 2017-03-15 NOTE — ED Provider Notes (Signed)
Patient placed in Quick Look pathway, seen and evaluated   Chief Complaint: nausea and vomiting  HPI:   Pt is a type I diabetic, presenting with acute onset of nausea and vomiting that began yesterday around 1:30 PM.  Patient states she took Phenergan without relief.  She states emesis is nonbloody and nonbilious.  Reports associated epigastric abdominal pain, and diarrhea.  Denies fever, urinary symptoms, or other complaints.  States her sugars are normally controlled, however since yesterday they have been ranging 200s to 400s. Recent "head cold" which has resolved. No recent travel.  ROS: + Nausea, + vomiting, + abdominal pain, + diarrhea (one)  Physical Exam:   Gen: No distress  Neuro: Awake and Alert  Skin: Warm    Focused Exam: abdomen is soft and nontender   Initiation of care has begun. The patient has been counseled on the process, plan, and necessity for staying for the completion/evaluation, and the remainder of the medical screening examination    Flavio Lindroth, [place] N, PA-C 03/15/17 1329    Daleen Bo, MD 03/16/17 1109

## 2017-03-15 NOTE — ED Provider Notes (Signed)
[facility] EMERGENCY DEPARTMENT Provider Note   CSN: [id number] Arrival date & time: 03/15/17  1236     History   Chief Complaint Chief Complaint  Patient presents with  . Emesis    HPI Jennifer Hogan is a 47 y.o. female.  HPI  47 year old female presents the emergency department history of insulin-dependent diabetes, remote history of DKA, hyperlipidemia, thyroid disorder presents emergency department with past 48 hours of nausea/vomiting/diarrhea nonbloody.  Patient states having fever up to 103.  Patient denies any abdominal pain, chest pain, shortness of breath, cough, nasal congestion, voiding symptoms.  Patient's been compliant with her insulin and typically glucoses in the 150 area.  Past Medical History:  Diagnosis Date  . Acute sinusitis 08/07/2015  . Allergic state 08/07/2015  . Anxiety   . Depression   . Diabetes mellitus   . High cholesterol   . Hypothyroidism     Patient Active Problem List   Diagnosis Date Noted  . Abnormal liver function test 11/23/2015  . Routine general medical examination at a health care facility 09/02/2015  . Allergic state 08/07/2015  . Vitiligo 07/09/2012  . Type 1 diabetes mellitus ([place]) 07/09/2012  . Depression 12/04/2010  . Dyslipidemia 12/04/2010  . Hypothyroidism 12/04/2010    Past Surgical History:  Procedure Laterality Date  . APPENDECTOMY      OB History    No data available       Home Medications    Prior to Admission medications   Medication Sig Start Date End Date Taking? Authorizing Provider  albuterol (PROVENTIL HFA;VENTOLIN HFA) 108 (90 Base) MCG/ACT inhaler Inhale 2 puffs into the lungs every 6 (six) hours as needed for wheezing or shortness of breath. 01/17/17   Hoyt Koch, MD  atorvastatin (LIPITOR) 10 MG tablet Take 1 tablet (10 mg total) by mouth daily. Patient not taking: Reported on 01/17/2017 12/20/15   Hoyt Koch, MD  buPROPion (WELLBUTRIN XL) 300 MG  24 hr tablet TAKE ONE TABLET BY MOUTH IN THE MORNING 01/19/16   Hoyt Koch, MD  Continuous Glucose Monitor Sup (ENLITE GLUCOSE SENSOR) MISC 1 Device by Does not apply route once a week. 10/08/14   Renato Shin, MD  cyclobenzaprine (FLEXERIL) 10 MG tablet Take 1 tablet (10 mg total) by mouth at bedtime. 08/04/16   Nche, Charlene Brooke, NP  FLUoxetine (PROZAC) 10 MG tablet Take 3 tablets (30 mg total) by mouth daily. 02/22/16   Hoyt Koch, MD  glucose blood (BAYER CONTOUR NEXT TEST) test strip Test blood glucose 2-3 times a day. Dx code: 250.01 08/23/12   Renato Shin, MD  Insulin Infusion Pump Supplies (MINIMED INFUSION SET-MMT 399) MISC 1 Device by Does not apply route every 3 (three) days.    [provider]  Insulin Infusion Pump Supplies (PARADIGM RESERVOIR 3ML) MISC 1 Device by Does not apply route every 3 (three) days.    [provider]  insulin lispro (HUMALOG) 100 UNIT/ML injection 35 units per insulin pump daily. 01/17/17   Hoyt Koch, MD  levothyroxine (SYNTHROID, LEVOTHROID) 112 MCG tablet Take 1 tablet (112 mcg total) by mouth daily before breakfast. 01/17/17   Hoyt Koch, MD  meloxicam (MOBIC) 15 MG tablet Take 1 tablet (15 mg total) by mouth daily. 01/17/17   Hoyt Koch, MD  mometasone (NASONEX) 50 MCG/ACT nasal spray Place 2 sprays into the nose daily.    [provider]  montelukast (SINGULAIR) 10 MG tablet Take 1 tablet (  10 mg total) by mouth at bedtime. 01/17/17   Hoyt Koch, MD    Family History Family History  Problem Relation Age of Onset  . Coronary artery disease Father   . Cancer Mother        T cell lymphoma    Social History Social History   Tobacco Use  . Smoking status: Never Smoker  . Smokeless tobacco: Never Used  Substance Use Topics  . Alcohol use: No    Alcohol/week: 0.0 oz  . Drug use: No     Allergies   Ciprofloxin hcl [ciprofloxacin] and Food color red [red  dye]   Review of Systems Review of Systems  Review of Systems  Constitutional: see HPI HENT: Negative for ear pain, sore throat and trouble swallowing.   Eyes: Negative for pain and visual disturbance.  Respiratory: Negative for cough and shortness of breath.   Cardiovascular: Negative for chest pain and leg swelling.  Gastrointestinal: see HPI Genitourinary: Negative for dysuria, urgency and frequency.  Musculoskeletal: Negative for back pain and joint swelling.  Skin: Negative for rash and wound.  Neurological: Negative for dizziness, syncope, speech difficulty, weakness and numbness.   Physical Exam Updated Vital Signs BP (!) 152/81   Pulse (!) 134   Temp 98.4 F (36.9 C) (Oral)   Resp 14   Ht 5\' 5"  (1.651 m)   Wt 74.8 kg (165 lb)   LMP 03/12/2017   SpO2 100%   BMI 27.46 kg/m   Physical Exam  Physical Exam Vitals:   03/15/17 1355 03/15/17 1624  BP: (!) 181/112 (!) 152/81  Pulse: 80 (!) 134  Resp: 16 14  Temp: 98.4 F (36.9 C)   SpO2: 100% 100%   Constitutional: Patient is in no acute distress Head: Normocephalic and atraumatic.  Eyes: Extraocular motion intact, no scleral icterus Neck: Supple without meningismus, mass, or overt JVD Respiratory: Effort normal and breath sounds normal. No respiratory distress. CV: Tachycardia no obvious murmurs.  Pulses +2 and symmetric Abdomen: Soft, non-tender, non-distended MSK: Extremities are atraumatic without deformity, ROM intact Skin: Warm, dry, intact Neuro: Alert and oriented, no motor deficit noted Psychiatric: Mood and affect are normal.  ED Treatments / Results  Labs (all labs ordered are listed, but only abnormal results are displayed) Labs Reviewed  COMPREHENSIVE METABOLIC PANEL - Abnormal; Notable for the following components:      Result Value   Sodium 134 (*)    CO2 15 (*)    Glucose, Bld 381 (*)    BUN 22 (*)    Creatinine, Ser 1.51 (*)    Total Protein 8.4 (*)    GFR calc non Af Amer 40 (*)      GFR calc Af Amer 46 (*)    Anion gap 17 (*)    All other components within normal limits  CBC WITH DIFFERENTIAL/PLATELET - Abnormal; Notable for the following components:   WBC 15.1 (*)    Platelets 538 (*)    Neutro Abs 13.8 (*)    All other components within normal limits  URINALYSIS, ROUTINE W REFLEX MICROSCOPIC - Abnormal; Notable for the following components:   Glucose, UA >=500 (*)    Hgb urine dipstick SMALL (*)    Ketones, ur 80 (*)    Protein, ur 30 (*)    Squamous Epithelial / LPF 0-5 (*)    All other components within normal limits  I-STAT CHEM 8, ED - Abnormal; Notable for the following components:   BUN 24 (*)  Creatinine, Ser 1.20 (*)    Glucose, Bld 360 (*)    TCO2 18 (*)    All other components within normal limits  LIPASE, BLOOD  I-STAT BETA HCG BLOOD, ED ([facility], [facility], [facility] ONLY)  I-STAT BETA HCG BLOOD, ED ([facility], [facility], [facility] ONLY)  CBG MONITORING, ED    EKG  EKG Interpretation None       Radiology No results found.  Procedures Procedures (including critical care time)  Medications Ordered in ED Medications  ondansetron (ZOFRAN-ODT) disintegrating tablet 4 mg (4 mg Oral Given 03/15/17 1325)     Initial Impression / Assessment and Plan / ED Course  I have reviewed the triage vital signs and the nursing notes.  Pertinent labs & imaging results that were available during my care of the patient were reviewed by me and considered in my medical decision making (see chart for details).     47 year old female presents the emergency department history of insulin-dependent diabetes, remote history of DKA, hyperlipidemia, thyroid disorder presents emergency department with past 48 hours of nausea/vomiting/diarrhea nonbloody.  Patient states having fever up to 103.  Patient denies any abdominal pain, chest pain, shortness of breath, cough, nasal congestion, voiding symptoms.  Patient's been compliant with her insulin and typically glucoses in the 150 area.  Patient  arrives uncomfortable appearing but otherwise tachycardic and normotensive.  Patient is afebrile.  Review of labs shows leukocytosis of 15,000, stable H&H, bicarb low at 15, BUN elevated 22 with a creatinine of 1.51 that is near an AK I from baseline, anion gap elevated at 17, potassium of 4.5 along with glucose of 360.  Lipase is normal.  Patient was given 2 L normal saline bolus in the emergency department will repeat BMP with blood gas possible early acidosis.   Venous blood gas 7.289.  Post 2 L bolus of normal saline repeat BMP with resolution of renal insufficiency creatinine now at 0.80.  Blood glucose has had an interval improvement from 362-93.  AG improved 17 to 13.   Patient's history/clinical presentation consistent with viral syndrome.  Influenza is negative.  Defer antibiotics.  Spoke with hospitalist plan for admission requested blood cultures x2 along with urine culture.  Of note, pt with insulin pump.   Pt admitted viral syndrome, volume contraction.  Pt may be presenting early DKA, but is responding to fluid resuscitation.    Final Clinical Impressions(s) / ED Diagnoses   Final diagnoses:  None    ED Discharge Orders    None       Willette Alma, DO 03/16/17 1038    Little, Wenda Overland, MD 03/16/17 1530

## 2017-03-15 NOTE — ED Triage Notes (Signed)
Pt arrives via POV from home with n/v since yesterday afternoon, pt type 1 diabetic with insulin pump. Pt with upper abdominal pain. Denies recent fever. +diarrhea. PT awake, alert, appropriate at present.

## 2017-03-15 NOTE — ED Notes (Signed)
Pt states that she has been throwing up since yesterday, states that anytime she drinks anything it comes right back up.

## 2017-03-16 ENCOUNTER — Observation Stay (HOSPITAL_BASED_OUTPATIENT_CLINIC_OR_DEPARTMENT_OTHER): Payer: Self-pay

## 2017-03-16 ENCOUNTER — Other Ambulatory Visit: Payer: Self-pay

## 2017-03-16 DIAGNOSIS — R6510 Systemic inflammatory response syndrome (SIRS) of non-infectious origin without acute organ dysfunction: Secondary | ICD-10-CM

## 2017-03-16 DIAGNOSIS — I514 Myocarditis, unspecified: Secondary | ICD-10-CM

## 2017-03-16 DIAGNOSIS — R197 Diarrhea, unspecified: Secondary | ICD-10-CM

## 2017-03-16 DIAGNOSIS — R112 Nausea with vomiting, unspecified: Secondary | ICD-10-CM | POA: Diagnosis present

## 2017-03-16 DIAGNOSIS — E1065 Type 1 diabetes mellitus with hyperglycemia: Secondary | ICD-10-CM

## 2017-03-16 DIAGNOSIS — N179 Acute kidney failure, unspecified: Secondary | ICD-10-CM

## 2017-03-16 DIAGNOSIS — I214 Non-ST elevation (NSTEMI) myocardial infarction: Secondary | ICD-10-CM

## 2017-03-16 LAB — BASIC METABOLIC PANEL
ANION GAP: 10 (ref 5–15)
Anion gap: 11 (ref 5–15)
BUN: 19 mg/dL (ref 6–20)
BUN: 20 mg/dL (ref 6–20)
CALCIUM: 8.6 mg/dL — AB (ref 8.9–10.3)
CHLORIDE: 112 mmol/L — AB (ref 101–111)
CO2: 16 mmol/L — AB (ref 22–32)
CO2: 18 mmol/L — AB (ref 22–32)
CREATININE: 1.06 mg/dL — AB (ref 0.44–1.00)
Calcium: 8.4 mg/dL — ABNORMAL LOW (ref 8.9–10.3)
Chloride: 111 mmol/L (ref 101–111)
Creatinine, Ser: 1 mg/dL (ref 0.44–1.00)
GFR calc non Af Amer: >60 mL/min (ref >60)
GFR calc non Af Amer: >60 mL/min (ref >60)
Glucose, Bld: 264 mg/dL — ABNORMAL HIGH (ref 65–99)
Glucose, Bld: 266 mg/dL — ABNORMAL HIGH (ref 65–99)
POTASSIUM: 4.7 mmol/L (ref 3.5–5.1)
Potassium: 4.5 mmol/L (ref 3.5–5.1)
SODIUM: 139 mmol/L (ref 135–145)
Sodium: 139 mmol/L (ref 135–145)

## 2017-03-16 LAB — ECHOCARDIOGRAM COMPLETE
Height: 65 in
WEIGHTICAEL: 2640 [oz_av]

## 2017-03-16 LAB — TSH: TSH: 1.302 u[IU]/mL (ref 0.350–4.500)

## 2017-03-16 LAB — CBC
HCT: 32 % — ABNORMAL LOW (ref 36.0–46.0)
HEMOGLOBIN: 10.8 g/dL — AB (ref 12.0–15.0)
MCH: 30.8 pg (ref 26.0–34.0)
MCHC: 33.8 g/dL (ref 30.0–36.0)
MCV: 91.2 fL (ref 78.0–100.0)
Platelets: 400 10*3/uL (ref 150–400)
RBC: 3.51 MIL/uL — AB (ref 3.87–5.11)
RDW: 13.1 % (ref 11.5–15.5)
WBC: 14.7 10*3/uL — ABNORMAL HIGH (ref 4.0–10.5)

## 2017-03-16 LAB — GLUCOSE, CAPILLARY
GLUCOSE-CAPILLARY: 228 mg/dL — AB (ref 65–99)
GLUCOSE-CAPILLARY: 220 mg/dL — AB (ref 65–99)
Glucose-Capillary: 168 mg/dL — ABNORMAL HIGH (ref 65–99)
Glucose-Capillary: 166 mg/dL — ABNORMAL HIGH (ref 65–99)

## 2017-03-16 LAB — HIV ANTIBODY (ROUTINE TESTING W REFLEX): HIV Screen 4th Generation wRfx: NONREACTIVE

## 2017-03-16 MED ORDER — ONDANSETRON HCL 4 MG/2ML IJ SOLN: 4.0000 mg | Freq: Four times a day (QID) | INTRAMUSCULAR | Status: DC | PRN

## 2017-03-16 MED ORDER — LEVOTHYROXINE SODIUM 112 MCG PO TABS
112.0000 ug | ORAL_TABLET | Freq: Every day | ORAL | Status: DC
  Administered 2017-03-16: 112 ug via ORAL
  Filled 2017-03-16: qty 1

## 2017-03-16 MED ORDER — MONTELUKAST SODIUM 10 MG PO TABS
10.0000 mg | ORAL_TABLET | Freq: Every day | ORAL | Status: DC
  Administered 2017-03-16: 10 mg via ORAL
  Filled 2017-03-16: qty 1

## 2017-03-16 MED ORDER — ENOXAPARIN SODIUM 40 MG/0.4ML ~~LOC~~ SOLN
40.0000 mg | SUBCUTANEOUS | Status: DC
  Filled 2017-03-16: qty 0.4

## 2017-03-16 MED ORDER — ACETAMINOPHEN 650 MG RE SUPP: 650.0000 mg | Freq: Four times a day (QID) | RECTAL | Status: DC | PRN

## 2017-03-16 MED ORDER — ACETAMINOPHEN 325 MG PO TABS: 650.0000 mg | ORAL_TABLET | Freq: Four times a day (QID) | ORAL | Status: DC | PRN

## 2017-03-16 MED ORDER — INSULIN PUMP
SUBCUTANEOUS | Status: DC
  Administered 2017-03-16: 1 via SUBCUTANEOUS
  Administered 2017-03-16: 0.8 via SUBCUTANEOUS
  Administered 2017-03-16 (×2): via SUBCUTANEOUS
  Filled 2017-03-16: qty 1

## 2017-03-16 MED ORDER — ONDANSETRON HCL 4 MG PO TABS: 4.0000 mg | ORAL_TABLET | Freq: Four times a day (QID) | ORAL | Status: DC | PRN

## 2017-03-16 MED ORDER — PANTOPRAZOLE SODIUM 40 MG PO TBEC
40.0000 mg | DELAYED_RELEASE_TABLET | Freq: Every day | ORAL | Status: DC
  Administered 2017-03-16: 40 mg via ORAL
  Filled 2017-03-16: qty 1

## 2017-03-16 MED ORDER — FLUOXETINE HCL 10 MG PO CAPS
30.0000 mg | ORAL_CAPSULE | Freq: Every day | ORAL | Status: DC
  Administered 2017-03-16: 30 mg via ORAL
  Filled 2017-03-16: qty 3

## 2017-03-16 NOTE — Plan of Care (Signed)
  Completed/Met Education: Knowledge of General Education information will improve 03/16/2017 1606 - Completed/Met by Emmaline Life, RN Health Behavior/Discharge Planning: Ability to manage health-related needs will improve 03/16/2017 1606 - Completed/Met by Emmaline Life, RN Clinical Measurements: Ability to maintain clinical measurements within normal limits will improve 03/16/2017 1606 - Completed/Met by Emmaline Life, RN Will remain free from infection 03/16/2017 1606 - Completed/Met by Emmaline Life, RN Diagnostic test results will improve 03/16/2017 1606 - Completed/Met by Emmaline Life, RN Respiratory complications will improve 03/16/2017 1606 - Completed/Met by Emmaline Life, RN Cardiovascular complication will be avoided 03/16/2017 1606 - Completed/Met by Emmaline Life, RN Activity: Risk for activity intolerance will decrease 03/16/2017 1606 - Completed/Met by Emmaline Life, RN Nutrition: Adequate nutrition will be maintained 03/16/2017 1606 - Completed/Met by Emmaline Life, RN Coping: Level of anxiety will decrease 03/16/2017 1606 - Completed/Met by Emmaline Life, RN Elimination: Will not experience complications related to bowel motility 03/16/2017 1606 - Completed/Met by Emmaline Life, RN Will not experience complications related to urinary retention 03/16/2017 1606 - Completed/Met by Emmaline Life, RN Pain Managment: General experience of comfort will improve 03/16/2017 1606 - Completed/Met by Emmaline Life, RN Safety: Ability to remain free from injury will improve 03/16/2017 1606 - Completed/Met by Emmaline Life, RN Skin Integrity: Risk for impaired skin integrity will decrease 03/16/2017 1606 - Completed/Met by Emmaline Life, RN

## 2017-03-16 NOTE — H&P (Addendum)
History and Physical    Jennifer Hogan EHU:[id number] DOB: 1970/05/27 DOA: 03/15/2017  PCP: Hoyt Koch, MD  Patient coming from: Home.  Chief Complaint: Nausea vomiting and diarrhea.  HPI: Jennifer Hogan is a 47 y.o. female with history of diabetes mellitus type 1, hypothyroidism, chronic pain and depression presents to the ER with complaints of persistent nausea vomiting and diarrhea.  Patient has been having the symptoms for last 2 days.  Patient had multiple episodes of diarrhea yesterday but today more of vomiting.  Has benign crampy abdominal discomfort.  Denies any blood in the vomitus or diarrhea.  Has been using insulin pump which she has not discontinued.  Unable to eat anything.  Patient was feeling tired.  Denies any chest pain or shortness of breath.  Patient states she recorded a fever of 102 F at home.  ED Course: In the ER patient is found to be tachycardic and afebrile labs revealed elevated blood sugar of 381 with increased creatinine of 1.5 from baseline.  Patient's anion gap initially was 17.  Patient was given fluid bolus.  Blood sugar has improved with fluid bolus.  Repeat metabolic panel is pending.  Influenza PCR is negative.  Abdomen appears benign.  Patient still feels weak and uncomfortable.  On exam patient is still tachycardic.  EKG shows biatrial enlargement with tachycardia.  Blood cultures have been sent.  Review of Systems: As per HPI, rest all negative.   Past Medical History:  Diagnosis Date  . Acute sinusitis 08/07/2015  . Allergic state 08/07/2015  . Anxiety   . Depression   . Diabetes mellitus   . High cholesterol   . Hypothyroidism     Past Surgical History:  Procedure Laterality Date  . APPENDECTOMY       reports that  has never smoked. she has never used smokeless tobacco. She reports that she does not drink alcohol or use drugs.  Allergies  Allergen Reactions  . Ciprofloxin Hcl [Ciprofloxacin] Nausea And Vomiting and  Other (See Comments)    Severe migraine  . Food Color Red [Red Dye] Diarrhea    Family History  Problem Relation Age of Onset  . Coronary artery disease Father   . Cancer Mother        T cell lymphoma    Prior to Admission medications   Medication Sig Start Date End Date Taking? Authorizing Provider  albuterol (PROVENTIL HFA;VENTOLIN HFA) 108 (90 Base) MCG/ACT inhaler Inhale 2 puffs into the lungs every 6 (six) hours as needed for wheezing or shortness of breath. 01/17/17  Yes Hoyt Koch, MD  ALPHA LIPOIC ACID PO Take 1 tablet by mouth daily.   Yes [provider]  b complex vitamins tablet Take 1 tablet by mouth daily.   Yes [provider]  Carboxymethylcellul-Glycerin (LUBRICATING EYE DROPS OP) Place 1 drop into both eyes daily as needed (dry eyes).   Yes [provider]  FLUoxetine (PROZAC) 10 MG tablet Take 3 tablets (30 mg total) by mouth daily. 02/22/16  Yes Hoyt Koch, MD  Insulin Infusion Pump Supplies (MINIMED INFUSION SET-MMT 399) MISC See admin instructions. Use with Humulin insulin - continuous - basal rate 0.625   Yes [provider]  levothyroxine (SYNTHROID, LEVOTHROID) 112 MCG tablet Take 1 tablet (112 mcg total) by mouth daily before breakfast. 01/17/17  Yes Hoyt Koch, MD  meloxicam (MOBIC) 15 MG tablet Take 1 tablet (15 mg total) by mouth daily. Patient taking differently: Take 7.5-15  mg by mouth at bedtime as needed for pain.  01/17/17  Yes Hoyt Koch, MD  mometasone (NASONEX) 50 MCG/ACT nasal spray Place 2 sprays into the nose daily as needed (seasonal allergies).    Yes [provider]  montelukast (SINGULAIR) 10 MG tablet Take 1 tablet (10 mg total) by mouth at bedtime. 01/17/17  Yes Hoyt Koch, MD  atorvastatin (LIPITOR) 10 MG tablet Take 1 tablet (10 mg total) by mouth daily. Patient not taking: Reported on 01/17/2017 12/20/15   Hoyt Koch, MD  buPROPion  (WELLBUTRIN XL) 300 MG 24 hr tablet TAKE ONE TABLET BY MOUTH IN THE MORNING Patient not taking: Reported on 03/15/2017 01/19/16   Hoyt Koch, MD  Continuous Glucose Monitor Sup (ENLITE GLUCOSE SENSOR) MISC 1 Device by Does not apply route once a week. 10/08/14   Renato Shin, MD  cyclobenzaprine (FLEXERIL) 10 MG tablet Take 1 tablet (10 mg total) by mouth at bedtime. Patient not taking: Reported on 03/15/2017 08/04/16   Nche, Charlene Brooke, NP  glucose blood (BAYER CONTOUR NEXT TEST) test strip Test blood glucose 2-3 times a day. Dx code: 250.01 08/23/12   Renato Shin, MD  insulin lispro (HUMALOG) 100 UNIT/ML injection 35 units per insulin pump daily. Patient not taking: Reported on 03/15/2017 01/17/17   Hoyt Koch, MD    Physical Exam: Vitals:   03/15/17 2154 03/15/17 2200 03/15/17 2230 03/15/17 2322  BP: 123/66 140/78 138/81 94/68  Pulse: (!) 122 (!) 127 (!) 121 (!) 118  Resp: (!) 24 (!) 22 (!) 27 (!) 23  Temp:    98.5 F (36.9 C)  TempSrc:    Oral  SpO2: 99% 99% 99% 99%  Weight:      Height:          Constitutional: Moderately built and nourished. Vitals:   03/15/17 2154 03/15/17 2200 03/15/17 2230 03/15/17 2322  BP: 123/66 140/78 138/81 94/68  Pulse: (!) 122 (!) 127 (!) 121 (!) 118  Resp: (!) 24 (!) 22 (!) 27 (!) 23  Temp:    98.5 F (36.9 C)  TempSrc:    Oral  SpO2: 99% 99% 99% 99%  Weight:      Height:       Eyes: Anicteric no pallor. ENMT: No discharge from the ears eyes nose or mouth. Neck: No mass felt.  No neck rigidity. Respiratory: No rhonchi or crepitations. Cardiovascular: S1-S2 heard tachycardic. Abdomen: Soft nontender bowel sounds present. Musculoskeletal: No edema.  No joint effusion. Skin: No rash.  Skin appears warm. Neurologic: Alert awake oriented to time place and person.  Moves all extremities. Psychiatric: Appears normal.  Normal affect.   Labs on Admission: I have personally reviewed following labs and imaging  studies  CBC: Recent Labs  Lab 03/15/17 1313 03/15/17 1329 03/15/17 2012  WBC 15.1*  --   --   NEUTROABS 13.8*  --   --   HGB 13.6 14.6 12.9  HCT 39.8 43.0 38.0  MCV 90.5  --   --   PLT 538*  --   --    Basic Metabolic Panel: Recent Labs  Lab 03/15/17 1313 03/15/17 1329 03/15/17 2012  NA 134* 136 140  K 4.5 4.5 4.4  CL 102 105 110  CO2 15*  --   --   GLUCOSE 381* 360* 293*  BUN 22* 24* 25*  CREATININE 1.51* 1.20* 0.80  CALCIUM 9.8  --   --    GFR: Estimated Creatinine Clearance: 87 mL/min (  by C-G formula based on SCr of 0.8 mg/dL). Liver Function Tests: Recent Labs  Lab 03/15/17 1313  AST 31  ALT 26  ALKPHOS 77  BILITOT 1.2  PROT 8.4*  ALBUMIN 4.7   Recent Labs  Lab 03/15/17 1313  LIPASE 19   No results for input(s): AMMONIA in the last 168 hours. Coagulation Profile: No results for input(s): INR, PROTIME in the last 168 hours. Cardiac Enzymes: No results for input(s): CKTOTAL, CKMB, CKMBINDEX, TROPONINI in the last 168 hours. BNP (last 3 results) No results for input(s): PROBNP in the last 8760 hours. HbA1C: No results for input(s): HGBA1C in the last 72 hours. CBG: Recent Labs  Lab 03/15/17 1738 03/15/17 2100  GLUCAP 404* 279*   Lipid Profile: No results for input(s): CHOL, HDL, LDLCALC, TRIG, CHOLHDL, LDLDIRECT in the last 72 hours. Thyroid Function Tests: No results for input(s): TSH, T4TOTAL, FREET4, T3FREE, THYROIDAB in the last 72 hours. Anemia Panel: No results for input(s): VITAMINB12, FOLATE, FERRITIN, TIBC, IRON, RETICCTPCT in the last 72 hours. Urine analysis:    Component Value Date/Time   COLORURINE YELLOW 03/15/2017 1600   APPEARANCEUR CLEAR 03/15/2017 1600   LABSPEC 1.018 03/15/2017 1600   PHURINE 5.0 03/15/2017 1600   GLUCOSEU >=500 (A) 03/15/2017 1600   GLUCOSEU NEGATIVE 11/23/2015 0831   HGBUR SMALL (A) 03/15/2017 1600   BILIRUBINUR NEGATIVE 03/15/2017 1600   BILIRUBINUR neg 12/07/2015 0843   KETONESUR 80 (A)  03/15/2017 1600   PROTEINUR 30 (A) 03/15/2017 1600   UROBILINOGEN 1.0 12/07/2015 0843   UROBILINOGEN 0.2 11/23/2015 0831   NITRITE NEGATIVE 03/15/2017 1600   LEUKOCYTESUR NEGATIVE 03/15/2017 1600   Sepsis Labs: @LABRCNTIP (procalcitonin:4,lacticidven:4) )No results found for this or any previous visit (from the past 240 hour(s)).   Radiological Exams on Admission: Dg Chest Portable 1 View  Result Date: 03/15/2017 CLINICAL DATA:  Fever, abdominal pain, and nausea and vomiting for 2 days. EXAM: PORTABLE CHEST 1 VIEW COMPARISON:  None. FINDINGS: The cardiomediastinal silhouette is within normal limits. The lungs are well inflated and clear. There is no evidence of pleural effusion or pneumothorax. No acute osseous abnormality is identified. IMPRESSION: No active disease. Electronically Signed   By: Logan Bores M.D.   On: 03/15/2017 17:09    EKG: Independently reviewed.  Sinus tachycardia with biatrial enlargement.  Assessment/Plan Principal Problem:   SIRS (systemic inflammatory response syndrome) (HCC) Active Problems:   Hypothyroidism   ARF (acute renal failure) (HCC)   Uncontrolled type 1 diabetes mellitus with hyperglycemia (HCC)   Nausea vomiting and diarrhea    1. SIRS likely from nausea vomiting and diarrhea -nausea vomiting and diarrhea likely from gastroenteritis.  Patient states he has been in contact with students at this time.  Denies using any antibiotics.  If there is any further diarrhea will check stool studies.  Continue with aggressive hydration I have ordered Ringer lactate for now.  Follow blood cultures and urine cultures. 2. Uncontrolled diabetes mellitus type 1 -if repeat metabolic shows an elevated anion gap but will start patient on IV insulin infusion for DKA otherwise we will continue with patient's insulin pump.  Closely follow metabolic panel continue with aggressive IV hydration. 3. Acute renal failure -likely from nausea vomiting and diarrhea.  Follow  metabolic panel.  Continue hydration.  UA just shows hyaline casts and ketones. 4. History of hypothyroidism on Synthroid which will be continued.  Check TSH. 5. EKG shows biatrial enlargement for which I have ordered 2D echo.   DVT prophylaxis: Lovenox.  Code Status: Full code. Family Communication: Discussed with patient. Disposition Plan: Home. Consults called: None. Admission status: Observation.   Rise Patience MD [facility] Pager [phone].  If 7PM-7AM, please contact night-coverage [email or web address] Password [email or web address]  03/16/2017, 12:01 AM

## 2017-03-16 NOTE — Progress Notes (Signed)
Arrival Method: Patient arrived in stretcher from ED. Mental Orientation: alert Telemetry:1 Assessment: See Doc Flow sheets. Skin: Warm, dry and intact. IV: Peripheral I  r a/c Pain:.denies Fall Prevention Safety Plan: Patient educated about fall prevention safety plan, understood and acknowledged. Admission Screening:108m01 Orientation: Patient has been oriented to the unit, staff and to the room. Will place isolation materials

## 2017-03-16 NOTE — Progress Notes (Signed)
  Echocardiogram 2D Echocardiogram has been performed.  Jennifer Hogan [name] 03/16/2017, 12:20 PM

## 2017-03-16 NOTE — Discharge Summary (Signed)
Physician Discharge Summary  Jennifer Hogan ZOX:[id number] DOB: May 06, 1970 DOA: 03/15/2017  PCP: Hoyt Koch, MD  Admit date: 03/15/2017 Discharge date: 03/16/2017  Time spent: 35 minutes  Recommendations for Outpatient Follow-up:  1. [facility] will call you for follow up due to abnormal ECHO/Cardiomyopathy, appointment requested Electronically  Discharge Diagnoses:  Principal Problem:    Suspected Viral gastroenteritis   Hypothyroidism   ARF (acute renal failure) ([place])   Uncontrolled type 1 diabetes mellitus with hyperglycemia ([place])   Nausea vomiting and diarrhea   Cardiomyopathy   Discharge Condition: improved  Diet recommendation: Diabetic  Filed Weights   03/15/17 1320  Weight: 74.8 kg (165 lb)    History of present illness:   Jennifer Hogan is a 47 y.o. female with history of diabetes mellitus type 1, hypothyroidism, chronic pain and depression presented to the ER with complaints of persistent nausea vomiting and severe diarrhea for last 1-2 days.   Hospital Course:   Profuse Diarrhea and Vomiting -improved with supportive care, IVF, anti-emetics -suspect viral etiology -tolerating soft/bland diet at the time of discharge this afternoon -diarrhea has resolved  TYpe 1DM -stable, continued on Insulin pump  Cardiomyopathy -ECHO was done due to abnormal EKG this afternoon -EF 45%, no h/o CAD or CHF -possibly takotsubo, but has Dm so needs workup Myoview or Cath -her ECHO report resulted after pt was discharged as she was completely asymptomatic from cardiac standpoint and unrelated to this admission hence didn't hold pt awaiting ECHO read -I called patient and told her about abnormal ECHO after I got the results post discharge and told her about need for further workup, I sent electronic message to [facility] for quick Follow up and I will call then on Monday to ensure a quick FU  Discharge Exam: Vitals:   03/16/17 0619 03/16/17 1000   BP: (!) 94/53 102/60  Pulse: 98 88  Resp: 19 18  Temp: 99.3 F (37.4 C) 99.5 F (37.5 C)  SpO2: 99% 98%    General: AAOx3 Cardiovascular: S1S2/RRR Respiratory: CTAB  Discharge Instructions   Discharge Instructions    Diet - low sodium heart healthy   Complete by:  As directed    Increase activity slowly   Complete by:  As directed      Allergies as of 03/16/2017      Reactions   Ciprofloxin Hcl [ciprofloxacin] Nausea And Vomiting, Other (See Comments)   Severe migraine   Food Color Red [red Dye] Diarrhea      Medication List    TAKE these medications   albuterol 108 (90 Base) MCG/ACT inhaler Commonly known as:  PROVENTIL HFA;VENTOLIN HFA Inhale 2 puffs into the lungs every 6 (six) hours as needed for wheezing or shortness of breath.   ALPHA LIPOIC ACID PO Take 1 tablet by mouth daily.   atorvastatin 10 MG tablet Commonly known as:  LIPITOR Take 1 tablet (10 mg total) by mouth daily.   b complex vitamins tablet Take 1 tablet by mouth daily.   buPROPion 300 MG 24 hr tablet Commonly known as:  WELLBUTRIN XL TAKE ONE TABLET BY MOUTH IN THE MORNING   cyclobenzaprine 10 MG tablet Commonly known as:  FLEXERIL Take 1 tablet (10 mg total) by mouth at bedtime.   ENLITE GLUCOSE SENSOR Misc 1 Device by Does not apply route once a week.   FLUoxetine 10 MG tablet Commonly known as:  PROZAC Take 3 tablets (30 mg total) by mouth daily.   glucose  blood test strip Commonly known as:  BAYER CONTOUR NEXT TEST Test blood glucose 2-3 times a day. Dx code: 250.01   insulin lispro 100 UNIT/ML injection Commonly known as:  HUMALOG 35 units per insulin pump daily.   levothyroxine 112 MCG tablet Commonly known as:  SYNTHROID, LEVOTHROID Take 1 tablet (112 mcg total) by mouth daily before breakfast.   LUBRICATING EYE DROPS OP Place 1 drop into both eyes daily as needed (dry eyes).   meloxicam 15 MG tablet Commonly known as:  MOBIC Take 1 tablet (15 mg total) by  mouth daily. What changed:    how much to take  when to take this  reasons to take this   MINIMED INFUSION SET-MMT [place] instructions. Use with Humulin insulin - continuous - basal rate 0.625   mometasone 50 MCG/ACT nasal spray Commonly known as:  NASONEX Place 2 sprays into the nose daily as needed (seasonal allergies).   montelukast 10 MG tablet Commonly known as:  SINGULAIR Take 1 tablet (10 mg total) by mouth at bedtime.      Allergies  Allergen Reactions  . Ciprofloxin Hcl [Ciprofloxacin] Nausea And Vomiting and Other (See Comments)    Severe migraine  . Food Color Red [Red Dye] Diarrhea   Follow-up Information    Hoyt Koch, MD Follow up in 10 day(s).   Specialty:  Internal Medicine Contact information: [place] [id number] [phone]            The results of significant diagnostics from this hospitalization (including imaging, microbiology, ancillary and laboratory) are listed below for reference.    Significant Diagnostic Studies: Dg Chest Portable 1 View  Result Date: 03/15/2017 CLINICAL DATA:  Fever, abdominal pain, and nausea and vomiting for 2 days. EXAM: PORTABLE CHEST 1 VIEW COMPARISON:  None. FINDINGS: The cardiomediastinal silhouette is within normal limits. The lungs are well inflated and clear. There is no evidence of pleural effusion or pneumothorax. No acute osseous abnormality is identified. IMPRESSION: No active disease. Electronically Signed   By: Logan Bores M.D.   On: 03/15/2017 17:09    Microbiology: No results found for this or any previous visit (from the past 240 hour(s)).   Labs: Basic Metabolic Panel: Recent Labs  Lab 03/15/17 1313 03/15/17 1329 03/15/17 2012 03/15/17 2342 03/16/17 0305  NA 134* 136 140 139 139  K 4.5 4.5 4.4 4.7 4.5  CL 102 105 110 112* 111  CO2 15*  --   --  16* 18*  GLUCOSE 381* 360* 293* 266* 264*  BUN 22* 24* 25* 19 20  CREATININE 1.51* 1.20* 0.80  1.06* 1.00  CALCIUM 9.8  --   --  8.4* 8.6*   Liver Function Tests: Recent Labs  Lab 03/15/17 1313  AST 31  ALT 26  ALKPHOS 77  BILITOT 1.2  PROT 8.4*  ALBUMIN 4.7   Recent Labs  Lab 03/15/17 1313  LIPASE 19   No results for input(s): AMMONIA in the last 168 hours. CBC: Recent Labs  Lab 03/15/17 1313 03/15/17 1329 03/15/17 2012 03/16/17 0305  WBC 15.1*  --   --  14.7*  NEUTROABS 13.8*  --   --   --   HGB 13.6 14.6 12.9 10.8*  HCT 39.8 43.0 38.0 32.0*  MCV 90.5  --   --  91.2  PLT 538*  --   --  400   Cardiac Enzymes: No results for input(s): CKTOTAL, CKMB, CKMBINDEX, TROPONINI in the last  168 hours. BNP: BNP (last 3 results) No results for input(s): BNP in the last 8760 hours.  ProBNP (last 3 results) No results for input(s): PROBNP in the last 8760 hours.  CBG: Recent Labs  Lab 03/15/17 2100 03/16/17 0319 03/16/17 0619 03/16/17 0737 03/16/17 1203  GLUCAP 279* 228* 168* 166* 220*       Signed:  Domenic Polite MD.  [facility] 03/16/2017, 6:34 PM

## 2017-03-16 NOTE — Progress Notes (Signed)
Spoke with patient in room. States that she was diagnosed with diabetes in 1984 and started on a Medtronic pump in 2014.  Was very sleepy and stated that she was going home today. States that she does not have health insurance and pays for her insulin and supplies out of pocket.  Did not want to talk. Per Dr. Cordelia Pen note in 2017: Basal rates: 0.6 units/hr. per 24 hours, 1 U/15 gms CHO, 1 U for 50 if glucose exceeds 100.    Will continue to monitor blood sugars while in the hospital.  Harvel Ricks RN BSN CDE Diabetes Coordinator Pager: [phone]  8am-5pm

## 2017-03-16 NOTE — Care Management Note (Addendum)
Case Management Note  Patient Details  Name: Jennifer Hogan MRN: [id number] Date of Birth: [birth year]  Subjective/Objective:   Admitted for SIRS (Systemic inflammatory response syndrome).             Action/Plan: Patient states she has no insurance at this time; may need medication assistance time of discharge.  Expected Discharge Date:  03/18/17               Expected Discharge Plan:  Home/Self Care  Discharge planning Services: CM  Status of Service:  In process, will continue to follow   Kristen Cardinal, RN  Nurse case [place] 03/16/2017, 10:28 AM  03/16/17 4:48 PM Patient with discharge orders home this evening.  Spoke with patient denies need of assistance with medications.  PCP noted.  No discharge needs needed.  Priscille Heidelberg, BSN, RN Nurse case [employer] [facility] Status of Service: Completed and signed off

## 2017-03-16 NOTE — Progress Notes (Signed)
Discharge instructions, RX's and follow up appts explained and provided to patient  Verbalized understanding. Patient left floor via wheelchair accompanied by staff. No c/o pain or shortness of breath.  Jayle Solarz, Tivis Ringer, RN

## 2017-03-17 ENCOUNTER — Encounter (HOSPITAL_COMMUNITY): Payer: Self-pay | Admitting: Emergency Medicine

## 2017-03-17 ENCOUNTER — Inpatient Hospital Stay (HOSPITAL_COMMUNITY)
Admission: EM | Admit: 2017-03-17 | Discharge: 2017-03-22 | DRG: 853 | Disposition: A | Discharge status: Home Health | Payer: Self-pay | Attending: Internal Medicine | Admitting: Internal Medicine

## 2017-03-17 ENCOUNTER — Telehealth: Payer: Self-pay | Admitting: Internal Medicine

## 2017-03-17 DIAGNOSIS — R7989 Other specified abnormal findings of blood chemistry: Secondary | ICD-10-CM | POA: Diagnosis present

## 2017-03-17 DIAGNOSIS — I5181 Takotsubo syndrome: Secondary | ICD-10-CM | POA: Diagnosis present

## 2017-03-17 DIAGNOSIS — Z8249 Family history of ischemic heart disease and other diseases of the circulatory system: Secondary | ICD-10-CM

## 2017-03-17 DIAGNOSIS — J301 Allergic rhinitis due to pollen: Secondary | ICD-10-CM | POA: Diagnosis present

## 2017-03-17 DIAGNOSIS — Z881 Allergy status to other antibiotic agents status: Secondary | ICD-10-CM

## 2017-03-17 DIAGNOSIS — E860 Dehydration: Secondary | ICD-10-CM | POA: Diagnosis present

## 2017-03-17 DIAGNOSIS — I951 Orthostatic hypotension: Secondary | ICD-10-CM | POA: Diagnosis not present

## 2017-03-17 DIAGNOSIS — I214 Non-ST elevation (NSTEMI) myocardial infarction: Secondary | ICD-10-CM | POA: Diagnosis not present

## 2017-03-17 DIAGNOSIS — E785 Hyperlipidemia, unspecified: Secondary | ICD-10-CM | POA: Diagnosis present

## 2017-03-17 DIAGNOSIS — K760 Fatty (change of) liver, not elsewhere classified: Secondary | ICD-10-CM | POA: Diagnosis present

## 2017-03-17 DIAGNOSIS — F329 Major depressive disorder, single episode, unspecified: Secondary | ICD-10-CM | POA: Diagnosis present

## 2017-03-17 DIAGNOSIS — Z9641 Presence of insulin pump (external) (internal): Secondary | ICD-10-CM

## 2017-03-17 DIAGNOSIS — R7881 Bacteremia: Principal | ICD-10-CM | POA: Diagnosis present

## 2017-03-17 DIAGNOSIS — E876 Hypokalemia: Secondary | ICD-10-CM | POA: Diagnosis present

## 2017-03-17 DIAGNOSIS — F419 Anxiety disorder, unspecified: Secondary | ICD-10-CM | POA: Diagnosis present

## 2017-03-17 DIAGNOSIS — E1042 Type 1 diabetes mellitus with diabetic polyneuropathy: Secondary | ICD-10-CM

## 2017-03-17 DIAGNOSIS — E039 Hypothyroidism, unspecified: Secondary | ICD-10-CM | POA: Diagnosis present

## 2017-03-17 DIAGNOSIS — Z91018 Allergy to other foods: Secondary | ICD-10-CM

## 2017-03-17 DIAGNOSIS — Z955 Presence of coronary angioplasty implant and graft: Secondary | ICD-10-CM

## 2017-03-17 DIAGNOSIS — Z807 Family history of other malignant neoplasms of lymphoid, hematopoietic and related tissues: Secondary | ICD-10-CM

## 2017-03-17 DIAGNOSIS — E7800 Pure hypercholesterolemia, unspecified: Secondary | ICD-10-CM | POA: Diagnosis present

## 2017-03-17 DIAGNOSIS — R945 Abnormal results of liver function studies: Secondary | ICD-10-CM | POA: Diagnosis present

## 2017-03-17 DIAGNOSIS — E109 Type 1 diabetes mellitus without complications: Secondary | ICD-10-CM | POA: Diagnosis present

## 2017-03-17 DIAGNOSIS — D649 Anemia, unspecified: Secondary | ICD-10-CM | POA: Diagnosis present

## 2017-03-17 DIAGNOSIS — R079 Chest pain, unspecified: Secondary | ICD-10-CM

## 2017-03-17 DIAGNOSIS — I5023 Acute on chronic systolic (congestive) heart failure: Secondary | ICD-10-CM | POA: Diagnosis present

## 2017-03-17 DIAGNOSIS — Z79890 Hormone replacement therapy (postmenopausal): Secondary | ICD-10-CM

## 2017-03-17 DIAGNOSIS — I25110 Atherosclerotic heart disease of native coronary artery with unstable angina pectoris: Secondary | ICD-10-CM | POA: Diagnosis present

## 2017-03-17 DIAGNOSIS — Z794 Long term (current) use of insulin: Secondary | ICD-10-CM

## 2017-03-17 HISTORY — DX: Non-ST elevation (NSTEMI) myocardial infarction: I21.4

## 2017-03-17 LAB — URINALYSIS, ROUTINE W REFLEX MICROSCOPIC
BILIRUBIN URINE: NEGATIVE
GLUCOSE, UA: 150 mg/dL — AB
KETONES UR: NEGATIVE mg/dL
LEUKOCYTES UA: NEGATIVE
Nitrite: NEGATIVE
PROTEIN: NEGATIVE mg/dL
RBC / HPF: NONE SEEN RBC/hpf (ref 0–5)
Specific Gravity, Urine: 1.003 — ABNORMAL LOW (ref 1.005–1.030)
pH: 7 (ref 5.0–8.0)

## 2017-03-17 LAB — BLOOD CULTURE ID PANEL (REFLEXED)
Acinetobacter baumannii: NOT DETECTED
CANDIDA PARAPSILOSIS: NOT DETECTED
Candida albicans: NOT DETECTED
Candida glabrata: NOT DETECTED
Candida krusei: NOT DETECTED
Candida tropicalis: NOT DETECTED
ENTEROCOCCUS SPECIES: NOT DETECTED
Enterobacter cloacae complex: NOT DETECTED
Enterobacteriaceae species: NOT DETECTED
Escherichia coli: NOT DETECTED
HAEMOPHILUS INFLUENZAE: NOT DETECTED
KLEBSIELLA OXYTOCA: NOT DETECTED
Klebsiella pneumoniae: NOT DETECTED
LISTERIA MONOCYTOGENES: NOT DETECTED
Neisseria meningitidis: NOT DETECTED
PROTEUS SPECIES: NOT DETECTED
Pseudomonas aeruginosa: NOT DETECTED
SERRATIA MARCESCENS: NOT DETECTED
STAPHYLOCOCCUS AUREUS BCID: NOT DETECTED
STAPHYLOCOCCUS SPECIES: NOT DETECTED
STREPTOCOCCUS PYOGENES: NOT DETECTED
Streptococcus agalactiae: NOT DETECTED
Streptococcus pneumoniae: NOT DETECTED
Streptococcus species: NOT DETECTED

## 2017-03-17 LAB — CBC WITH DIFFERENTIAL/PLATELET
BASOS PCT: 0 %
Basophils Absolute: 0 10*3/uL (ref 0.0–0.1)
EOS ABS: 0.2 10*3/uL (ref 0.0–0.7)
EOS PCT: 3 %
HCT: 34.3 % — ABNORMAL LOW (ref 36.0–46.0)
Hemoglobin: 11.4 g/dL — ABNORMAL LOW (ref 12.0–15.0)
LYMPHS ABS: 2.6 10*3/uL (ref 0.7–4.0)
Lymphocytes Relative: 44 %
MCH: 30.6 pg (ref 26.0–34.0)
MCHC: 33.2 g/dL (ref 30.0–36.0)
MCV: 92 fL (ref 78.0–100.0)
MONO ABS: 0.3 10*3/uL (ref 0.1–1.0)
MONOS PCT: 5 %
NEUTROS PCT: 48 %
Neutro Abs: 2.8 10*3/uL (ref 1.7–7.7)
PLATELETS: 420 10*3/uL — AB (ref 150–400)
RBC: 3.73 MIL/uL — ABNORMAL LOW (ref 3.87–5.11)
RDW: 13.1 % (ref 11.5–15.5)
WBC: 5.8 10*3/uL (ref 4.0–10.5)

## 2017-03-17 LAB — URINE CULTURE: Culture: <10000 — AB

## 2017-03-17 LAB — COMPREHENSIVE METABOLIC PANEL
ALT: 22 U/L (ref 14–54)
AST: 50 U/L — ABNORMAL HIGH (ref 15–41)
Albumin: 3.5 g/dL (ref 3.5–5.0)
Alkaline Phosphatase: 61 U/L (ref 38–126)
Anion gap: 19 — ABNORMAL HIGH (ref 5–15)
BUN: 7 mg/dL (ref 6–20)
CHLORIDE: 98 mmol/L — AB (ref 101–111)
CO2: 21 mmol/L — ABNORMAL LOW (ref 22–32)
Calcium: 8.2 mg/dL — ABNORMAL LOW (ref 8.9–10.3)
Creatinine, Ser: 0.83 mg/dL (ref 0.44–1.00)
Glucose, Bld: 288 mg/dL — ABNORMAL HIGH (ref 65–99)
POTASSIUM: 3.7 mmol/L (ref 3.5–5.1)
Sodium: 138 mmol/L (ref 135–145)
Total Bilirubin: 0.8 mg/dL (ref 0.3–1.2)
Total Protein: 6.6 g/dL (ref 6.5–8.1)

## 2017-03-17 LAB — I-STAT BETA HCG BLOOD, ED (MC, WL, AP ONLY)

## 2017-03-17 LAB — I-STAT CG4 LACTIC ACID, ED
Lactic Acid, Venous: 2.76 mmol/L (ref 0.5–1.9)
Lactic Acid, Venous: 2.55 mmol/L (ref 0.5–1.9)

## 2017-03-17 LAB — PROTIME-INR
INR: 1.04
PROTHROMBIN TIME: 13.5 s (ref 11.4–15.2)

## 2017-03-17 LAB — SEDIMENTATION RATE: SED RATE: 8 mm/h (ref 0–22)

## 2017-03-17 MED ORDER — BUPROPION HCL ER (XL) 300 MG PO TB24
300.0000 mg | ORAL_TABLET | Freq: Every morning | ORAL | Status: DC
  Administered 2017-03-18 – 2017-03-22 (×4): 300 mg via ORAL
  Filled 2017-03-17 (×4): qty 1
  Filled 2017-03-17: qty 2

## 2017-03-17 MED ORDER — VANCOMYCIN HCL IN DEXTROSE 1-5 GM/200ML-% IV SOLN
1000.0000 mg | Freq: Two times a day (BID) | INTRAVENOUS | Status: DC
  Filled 2017-03-17: qty 200

## 2017-03-17 MED ORDER — CYCLOBENZAPRINE HCL 10 MG PO TABS
10.0000 mg | ORAL_TABLET | Freq: Every day | ORAL | Status: DC
  Administered 2017-03-18 – 2017-03-21 (×5): 10 mg via ORAL
  Filled 2017-03-17 (×5): qty 1

## 2017-03-17 MED ORDER — INSULIN PUMP
Freq: Three times a day (TID) | SUBCUTANEOUS | Status: DC
  Administered 2017-03-18 (×2): via SUBCUTANEOUS
  Administered 2017-03-18: 0.2 via SUBCUTANEOUS
  Administered 2017-03-19: 2 via SUBCUTANEOUS
  Administered 2017-03-19: 0.5 via SUBCUTANEOUS
  Administered 2017-03-20: 0.2 via SUBCUTANEOUS
  Administered 2017-03-20 – 2017-03-21 (×3): via SUBCUTANEOUS
  Administered 2017-03-21: 1.4 via SUBCUTANEOUS
  Administered 2017-03-22: 07:00:00 0.4 via SUBCUTANEOUS
  Administered 2017-03-22: 0.8 via SUBCUTANEOUS
  Filled 2017-03-17: qty 1

## 2017-03-17 MED ORDER — LEVOTHYROXINE SODIUM 112 MCG PO TABS
112.0000 ug | ORAL_TABLET | Freq: Every day | ORAL | Status: DC
  Administered 2017-03-18 – 2017-03-22 (×5): 112 ug via ORAL
  Filled 2017-03-17 (×5): qty 1

## 2017-03-17 MED ORDER — VANCOMYCIN HCL IN DEXTROSE 1-5 GM/200ML-% IV SOLN
1000.0000 mg | Freq: Once | INTRAVENOUS | Status: AC
  Administered 2017-03-17: 1000 mg via INTRAVENOUS
  Filled 2017-03-17: qty 200

## 2017-03-17 MED ORDER — MELOXICAM 7.5 MG PO TABS: 7.5000 mg | ORAL_TABLET | Freq: Every evening | ORAL | Status: DC | PRN

## 2017-03-17 MED ORDER — ATORVASTATIN CALCIUM 10 MG PO TABS
10.0000 mg | ORAL_TABLET | Freq: Every day | ORAL | Status: DC
  Administered 2017-03-18 – 2017-03-19 (×2): 10 mg via ORAL
  Filled 2017-03-17 (×2): qty 1

## 2017-03-17 MED ORDER — ENOXAPARIN SODIUM 40 MG/0.4ML ~~LOC~~ SOLN
40.0000 mg | SUBCUTANEOUS | Status: DC
  Administered 2017-03-18: 40 mg via SUBCUTANEOUS
  Filled 2017-03-17 (×2): qty 0.4

## 2017-03-17 MED ORDER — POLYVINYL ALCOHOL 1.4 % OP SOLN: 1.0000 [drp] | Freq: Every day | OPHTHALMIC | Status: DC | PRN

## 2017-03-17 MED ORDER — FLUOXETINE HCL 10 MG PO CAPS
30.0000 mg | ORAL_CAPSULE | Freq: Every day | ORAL | Status: DC
  Administered 2017-03-18 – 2017-03-22 (×4): 30 mg via ORAL
  Filled 2017-03-17 (×5): qty 3

## 2017-03-17 MED ORDER — MONTELUKAST SODIUM 10 MG PO TABS
10.0000 mg | ORAL_TABLET | Freq: Every day | ORAL | Status: DC
  Administered 2017-03-18 – 2017-03-21 (×5): 10 mg via ORAL
  Filled 2017-03-17 (×5): qty 1

## 2017-03-17 MED ORDER — ACETAMINOPHEN 650 MG RE SUPP: 650.0000 mg | Freq: Four times a day (QID) | RECTAL | Status: DC | PRN

## 2017-03-17 MED ORDER — ALBUTEROL SULFATE HFA 108 (90 BASE) MCG/ACT IN AERS
2.0000 | INHALATION_SPRAY | Freq: Four times a day (QID) | RESPIRATORY_TRACT | Status: DC | PRN
Start: 2017-03-17 — End: 2017-03-17
  Filled 2017-03-17: qty 6.7

## 2017-03-17 MED ORDER — PIPERACILLIN-TAZOBACTAM 3.375 G IVPB 30 MIN
3.3750 g | Freq: Once | INTRAVENOUS | Status: AC
  Administered 2017-03-17: 3.375 g via INTRAVENOUS
  Filled 2017-03-17: qty 50

## 2017-03-17 MED ORDER — ACETAMINOPHEN 325 MG PO TABS: 650.0000 mg | ORAL_TABLET | Freq: Four times a day (QID) | ORAL | Status: DC | PRN

## 2017-03-17 MED ORDER — SODIUM CHLORIDE 0.9 % IV SOLN
INTRAVENOUS | Status: AC
  Administered 2017-03-18 (×2): via INTRAVENOUS

## 2017-03-17 MED ORDER — FLUTICASONE PROPIONATE 50 MCG/ACT NA SUSP
2.0000 | Freq: Every day | NASAL | Status: DC
  Administered 2017-03-18 – 2017-03-21 (×3): 2 via NASAL
  Filled 2017-03-17 (×3): qty 16

## 2017-03-17 MED ORDER — SODIUM CHLORIDE 0.9 % IV BOLUS (SEPSIS)
1000.0000 mL | Freq: Once | INTRAVENOUS | Status: AC
  Administered 2017-03-17: 1000 mL via INTRAVENOUS

## 2017-03-17 MED ORDER — PIPERACILLIN-TAZOBACTAM 3.375 G IVPB
3.3750 g | Freq: Three times a day (TID) | INTRAVENOUS | Status: DC
  Administered 2017-03-18 – 2017-03-19 (×5): 3.375 g via INTRAVENOUS
  Filled 2017-03-17 (×6): qty 50

## 2017-03-17 MED ORDER — ALBUTEROL SULFATE (2.5 MG/3ML) 0.083% IN NEBU: 3.0000 mL | INHALATION_SOLUTION | Freq: Four times a day (QID) | RESPIRATORY_TRACT | Status: DC | PRN

## 2017-03-17 NOTE — Telephone Encounter (Signed)
Phone call from answering service  That [facility]   Noted positive  of a preliminary positive blood culture gram-negative rod to be identified on this patient who was discharged yesterday.  The lab was unable to contact the doctor of record and  Hospital team  because they were not working today.  On review of chart it appeared that patient was admitted with nausea vomiting SIRS diabetes and treated as a viral infection hydration and discharged home.  the discharge summary is not completed yet for me to review.  It appears that she received no antibiotics in hospital as far as I can tell .  advised her to return to the hospital because of positive blood culture for evaluation. Reported  PCR  Panel. was negative.

## 2017-03-17 NOTE — Progress Notes (Signed)
Pharmacy Antibiotic Note  Jennifer Hogan is a 47 y.o. female admitted on 03/17/2017. Noted that  [facility] lab report of  positive of a preliminary positive blood culture gram-negative rod to be identified on this patient who was discharged yesterday. ED MD notes that upon her review of chart it appeared that patient was admitted with nausea vomiting SIRS diabetes and treated as a viral infection hydration and discharged home.  Patient advised to return to hospital.  Pharmacy has been consulted for Vancomycin and Zosyn dosing for sepsis. Goal Vanc trough = 15-20 mcg/ml  Plan: Vancomycin 1000 mg IV q12h  Zosyn 3.375 gm IV x1 now infuse over 30 min then 3.375 gm IV  q8h (Extended infusion over 4h) Monitor clinical progress, renal function, steady state vanc trough.      Temp (24hrs), Avg:99.9 F (37.7 C), Min:99.9 F (37.7 C), Max:99.9 F (37.7 C)  Recent Labs  Lab 03/15/17 1313 03/15/17 1329 03/15/17 1748 03/15/17 2012 03/15/17 2342 03/16/17 0305 03/17/17 1556 03/17/17 1651  WBC 15.1*  --   --   --   --  14.7* 5.8  --   CREATININE 1.51* 1.20*  --  0.80 1.06* 1.00  --   --   LATICACIDVEN  --   --  1.59 1.38  --   --   --  2.76*    Estimated Creatinine Clearance: 69.6 mL/min (by C-G formula based on SCr of 1 mg/dL).    Allergies  Allergen Reactions  . Ciprofloxin Hcl [Ciprofloxacin] Nausea And Vomiting and Other (See Comments)    Severe migraine  . Food Color Red [Red Dye] Diarrhea    Antimicrobials this admission: Vancomycin 2/23>> Zosyn 2/23>>   Dose adjustments this admission:   Microbiology results: 2/23 BCx: sent 2/21 BCx x2:  2/21  BCID:    Thank you for allowing pharmacy to be a part of this patient's care. Nicole Cella, RPh Clinical Pharmacist Pager: [place] [phone] 03/17/2017 5:36 PM

## 2017-03-17 NOTE — ED Triage Notes (Signed)
Pt returning to ER after being discharged last night from admission for GI virus/DKA, states was told her blood cultures "grew something." reviewing chart it appears she grew gram negative and was not treated with any antibiotics in the hospital. VSS. Pt in NAD.

## 2017-03-17 NOTE — ED Notes (Signed)
Elevated CG-4 reported to [id number]-[email or web address]@NF 

## 2017-03-17 NOTE — ED Provider Notes (Signed)
Emergency Department Provider Note   I have reviewed the triage vital signs and the nursing notes.   HISTORY  Chief Complaint Abnormal Lab   HPI Jennifer Hogan is a 47 y.o. female presents to the emergency department for evaluation of positive blood cultures.  The patient was admitted and discharged yesterday with gastrointestinal illness including vomiting and fevers at home.  She stayed overnight for IV fluids and Zofran.  She was not treated with antibiotics as no source was found for her symptoms.  Since she is discharged home she is continued to feel extremely fatigued.  This morning she was feeling hot and cold and woke up covered in sweat.  She denies severe abdominal pain, vomiting, diarrhea.  No other infection symptoms.  She was called by phone to return to the emergency department because of a positive blood culture.  Past Medical History:  Diagnosis Date  . Acute sinusitis 08/07/2015  . Allergic state 08/07/2015  . Anxiety   . Depression   . Diabetes mellitus   . High cholesterol   . Hypothyroidism     Patient Active Problem List   Diagnosis Date Noted  . Bacteremia 03/17/2017  . Anemia 03/17/2017  . Nausea vomiting and diarrhea 03/16/2017  . SIRS (systemic inflammatory response syndrome) ([place]) 03/15/2017  . ARF (acute renal failure) ([place]) 03/15/2017  . Uncontrolled type 1 diabetes mellitus with hyperglycemia ([place]) 03/15/2017  . Abnormal liver function test 11/23/2015  . Routine general medical examination at a health care facility 09/02/2015  . Allergic state 08/07/2015  . Vitiligo 07/09/2012  . Type 1 diabetes mellitus ([place]) 07/09/2012  . Depression 12/04/2010  . Dyslipidemia 12/04/2010  . Hypothyroidism 12/04/2010    Past Surgical History:  Procedure Laterality Date  . APPENDECTOMY        Allergies Ciprofloxin hcl [ciprofloxacin] and Food color red [red dye]  Family History  Problem Relation Age of Onset  . Coronary artery disease Father     . Cancer Mother        T cell lymphoma    Social History Social History   Tobacco Use  . Smoking status: Never Smoker  . Smokeless tobacco: Never Used  Substance Use Topics  . Alcohol use: No    Alcohol/week: 0.0 oz  . Drug use: No    Review of Systems  Constitutional: Positive fever/chills. Positive night sweats.  Eyes: No visual changes. ENT: No sore throat. Cardiovascular: Denies chest pain. Respiratory: Denies shortness of breath. Gastrointestinal: No abdominal pain.  No nausea, no vomiting.  No diarrhea.  No constipation. Genitourinary: Negative for dysuria. Musculoskeletal: Negative for back pain. Skin: Negative for rash. Neurological: Negative for headaches, focal weakness or numbness.  10-point ROS otherwise negative.  ____________________________________________   PHYSICAL EXAM:  VITAL SIGNS: ED Triage Vitals [03/17/17 1553]  Enc Vitals Group     BP 125/87     Pulse Rate 88     Resp 18     Temp 99.9 F (37.7 C)     Temp Source Oral     SpO2 100 %     Pain Score 5   Constitutional: Alert and oriented. Well appearing and in no acute distress. Eyes: Conjunctivae are normal.  Head: Atraumatic. Nose: No congestion/rhinnorhea. Mouth/Throat: Mucous membranes are dry.  Neck: No stridor.  Cardiovascular: Normal rate, regular rhythm. Good peripheral circulation. Grossly normal heart sounds.   Respiratory: Normal respiratory effort.  No retractions. Lungs CTAB. Gastrointestinal: Soft and nontender. No distention.  Musculoskeletal: No lower  extremity tenderness nor edema. No gross deformities of extremities. Neurologic:  Normal speech and language. No gross focal neurologic deficits are appreciated.  Skin:  Skin is warm, dry and intact. No rash noted.   ____________________________________________   LABS (all labs ordered are listed, but only abnormal results are displayed)  Labs Reviewed  URINALYSIS, ROUTINE W REFLEX MICROSCOPIC - Abnormal; Notable  for the following components:      Result Value   Color, Urine STRAW (*)    Specific Gravity, Urine 1.003 (*)    Glucose, UA 150 (*)    Hgb urine dipstick SMALL (*)    Bacteria, UA RARE (*)    Squamous Epithelial / LPF 0-5 (*)    All other components within normal limits  CBC WITH DIFFERENTIAL/PLATELET - Abnormal; Notable for the following components:   RBC 3.73 (*)    Hemoglobin 11.4 (*)    HCT 34.3 (*)    Platelets 420 (*)    All other components within normal limits  COMPREHENSIVE METABOLIC PANEL - Abnormal; Notable for the following components:   Chloride 98 (*)    CO2 21 (*)    Glucose, Bld 288 (*)    Calcium 8.2 (*)    AST 50 (*)    Anion gap 19 (*)    All other components within normal limits  COMPREHENSIVE METABOLIC PANEL - Abnormal; Notable for the following components:   Potassium 3.3 (*)    Glucose, Bld 152 (*)    Calcium 8.4 (*)    Total Protein 5.6 (*)    Albumin 3.0 (*)    All other components within normal limits  CBC - Abnormal; Notable for the following components:   RBC 3.52 (*)    Hemoglobin 10.6 (*)    HCT 32.2 (*)    All other components within normal limits  GLUCOSE, CAPILLARY - Abnormal; Notable for the following components:   Glucose-Capillary 101 (*)    All other components within normal limits  I-STAT CG4 LACTIC ACID, ED - Abnormal; Notable for the following components:   Lactic Acid, Venous 2.76 (*)    All other components within normal limits  I-STAT CG4 LACTIC ACID, ED - Abnormal; Notable for the following components:   Lactic Acid, Venous 2.55 (*)    All other components within normal limits  CULTURE, BLOOD (ROUTINE X 2)  CULTURE, BLOOD (ROUTINE X 2)  PROTIME-INR  SEDIMENTATION RATE  CBC WITH DIFFERENTIAL/PLATELET  TROPONIN I  I-STAT BETA HCG BLOOD, ED ([facility], [facility], [facility] ONLY)    ____________________________________________  EKG  None ____________________________________________  RADIOLOGY  None ____________________________________________   PROCEDURES  Procedure(s) performed:   Procedures  None ____________________________________________   INITIAL IMPRESSION / ASSESSMENT AND PLAN / ED COURSE  Pertinent labs & imaging results that were available during my care of the patient were reviewed by me and considered in my medical decision making (see chart for details).  Patient presents to the emergency department for evaluation of positive blood culture.  1 of 2 blood cultures from her recent admission grew gram-negative rods.  She has an elevated lactate here and continues to be symptomatic this morning with sweats and feeling hot/cold.  I have initiated broad-spectrum antibiotics.  No abdominal tenderness on exam.  She is overall well-appearing but slightly dehydrated.  Plan for admission.  Labs reviewed. No imaging ordered.   Discussed patient's case with Hospitalist, Dr. Maudie Mercury to request admission. Patient and family (if present) updated with plan. Care transferred to Hospitalist service.  I  reviewed all nursing notes, vitals, pertinent old records, EKGs, labs, imaging (as available).  ____________________________________________  FINAL CLINICAL IMPRESSION(S) / ED DIAGNOSES  Final diagnoses:  Bacteremia     MEDICATIONS GIVEN DURING THIS VISIT:  Medications  piperacillin-tazobactam (ZOSYN) IVPB 3.375 g (0 g Intravenous Stopped 03/18/17 0432)  atorvastatin (LIPITOR) tablet 10 mg (not administered)  buPROPion (WELLBUTRIN XL) 24 hr tablet 300 mg (not administered)  polyvinyl alcohol (LIQUIFILM TEARS) 1.4 % ophthalmic solution 1 drop (not administered)  cyclobenzaprine (FLEXERIL) tablet 10 mg (10 mg Oral Given 03/18/17 0018)  FLUoxetine (PROZAC) capsule 30 mg (not administered)  levothyroxine (SYNTHROID, LEVOTHROID) tablet 112 mcg (112 mcg  Oral Given 03/18/17 0801)  fluticasone (FLONASE) 50 MCG/ACT nasal spray 2 spray (not administered)  montelukast (SINGULAIR) tablet 10 mg (10 mg Oral Given 03/18/17 0019)  meloxicam (MOBIC) tablet 7.5-15 mg (not administered)  enoxaparin (LOVENOX) injection 40 mg (40 mg Subcutaneous Given 03/18/17 0018)  0.9 %  sodium chloride infusion ( Intravenous New Bag/Given 03/18/17 0020)  acetaminophen (TYLENOL) tablet 650 mg (not administered)    Or  acetaminophen (TYLENOL) suppository 650 mg (not administered)  albuterol (PROVENTIL) (2.5 MG/3ML) 0.083% nebulizer solution 3 mL (not administered)  insulin pump ( Subcutaneous Not Given 03/18/17 0148)  piperacillin-tazobactam (ZOSYN) IVPB 3.375 g (0 g Intravenous Stopped 03/17/17 1825)  vancomycin (VANCOCIN) IVPB 1000 mg/200 mL premix (0 mg Intravenous Stopped 03/17/17 1923)  sodium chloride 0.9 % bolus 1,000 mL (0 mLs Intravenous Stopped 03/17/17 1923)     Note:  This document was prepared using Dragon voice recognition software and may include unintentional dictation errors.  Nanda Quinton, MD Emergency Medicine   Alyana Kreiter, [facility], MD 03/18/17 [phone]

## 2017-03-17 NOTE — ED Notes (Signed)
Pt c/o SHOB. Sts nothing new, used home albuterol inhaler.

## 2017-03-17 NOTE — ED Notes (Signed)
Admitting provider at bedside.

## 2017-03-17 NOTE — H&P (Signed)
TRH H&P   Patient Demographics:    Jennifer Hogan, is a 47 y.o. female  MRN: [id number]   DOB - 09/26/1970  Admit Date - 03/17/2017  Outpatient Primary MD for the patient is Hoyt Koch, MD  Referring MD/NP/PA: Bartholomew Crews  Outpatient Specialists:     Patient coming from: home  Chief Complaint  Patient presents with  . Abnormal Lab      HPI:    Jennifer Hogan  is a 47 y.o. female, w dm1, hypothyroidism, chronic pain, and depression and represented to ED due to sweating, and just feeling bad. 1/2 gram negative rod on blood culture.  Pt denies sore throat, cough, cp, palp, sob, n/v, abd pain, diarrhea, brbpr, dysuria, hematuria.  Pt presented to ED for evaluation after notified 1/2 blood culture gram negative rods    In ED,  CXR IMPRESSION: No active disease.  Urine ketones negative, wbc 0-5  Wbc 5.8, Hgb 11.4, Plt 420 Na 138, K 3.7,  Glucose 288 Bun 7, creatinine 0.83 Ast 50, Alt 22, Alk phos 61, T. Bili 0.8  Lactic acid 2.55  Blood culture x2 pending  Pt received vanco and zosyn iv x1 in the ED.    Pt will be admitted for bacteremia, w elevation in lactic acid.          Review of systems:    In addition to the HPI above, No Fever-chills, No Headache, No changes with Vision or hearing, No problems swallowing food or Liquids, No Chest pain, Cough or Shortness of Breath, No Abdominal pain, No Nausea or Vommitting, Bowel movements are regular, No Blood in stool or Urine, No dysuria, No new skin rashes or bruises, No new joints pains-aches,  No new weakness, tingling, numbness in any extremity, No recent weight gain or loss, No polyuria, polydypsia or polyphagia, No significant Mental Stressors.  A full 10 point Review of Systems was done, except as stated above, all other Review of Systems were negative.   With Past History of the  following :    Past Medical History:  Diagnosis Date  . Acute sinusitis 08/07/2015  . Allergic state 08/07/2015  . Anxiety   . Depression   . Diabetes mellitus   . High cholesterol   . Hypothyroidism       Past Surgical History:  Procedure Laterality Date  . APPENDECTOMY        Social History:     Social History   Tobacco Use  . Smoking status: Never Smoker  . Smokeless tobacco: Never Used  Substance Use Topics  . Alcohol use: No    Alcohol/week: 0.0 oz     Lives - at home  Mobility - walks by self   Family History :     Family History  Problem Relation Age of Onset  . Coronary artery disease Father   . Cancer Mother  T cell lymphoma      Home Medications:   Prior to Admission medications   Medication Sig Start Date End Date Taking? Authorizing Provider  albuterol (PROVENTIL HFA;VENTOLIN HFA) 108 (90 Base) MCG/ACT inhaler Inhale 2 puffs into the lungs every 6 (six) hours as needed for wheezing or shortness of breath. 01/17/17  Yes Hoyt Koch, MD  ALPHA LIPOIC ACID PO Take 1 tablet by mouth daily.   Yes [provider]  atorvastatin (LIPITOR) 10 MG tablet Take 1 tablet (10 mg total) by mouth daily. 12/20/15  Yes Hoyt Koch, MD  b complex vitamins tablet Take 1 tablet by mouth daily.   Yes [provider]  buPROPion (WELLBUTRIN XL) 300 MG 24 hr tablet TAKE ONE TABLET BY MOUTH IN THE MORNING 01/19/16  Yes Hoyt Koch, MD  Carboxymethylcellul-Glycerin (LUBRICATING EYE DROPS OP) Place 1 drop into both eyes daily as needed (dry eyes).   Yes [provider]  cyclobenzaprine (FLEXERIL) 10 MG tablet Take 1 tablet (10 mg total) by mouth at bedtime. 08/04/16  Yes Nche, Charlene Brooke, NP  FLUoxetine (PROZAC) 10 MG tablet Take 3 tablets (30 mg total) by mouth daily. 02/22/16  Yes Hoyt Koch, MD  insulin lispro (HUMALOG) 100 UNIT/ML injection 35 units per insulin pump daily. 01/17/17  Yes Hoyt Koch, MD  levothyroxine (SYNTHROID, LEVOTHROID) 112 MCG tablet Take 1 tablet (112 mcg total) by mouth daily before breakfast. 01/17/17  Yes Hoyt Koch, MD  meloxicam (MOBIC) 15 MG tablet Take 1 tablet (15 mg total) by mouth daily. Patient taking differently: Take 7.5-15 mg by mouth at bedtime as needed for pain.  01/17/17  Yes Hoyt Koch, MD  mometasone (NASONEX) 50 MCG/ACT nasal spray Place 2 sprays into the nose daily as needed (seasonal allergies).    Yes [provider]  montelukast (SINGULAIR) 10 MG tablet Take 1 tablet (10 mg total) by mouth at bedtime. 01/17/17  Yes Hoyt Koch, MD  Continuous Glucose Monitor Sup (ENLITE GLUCOSE SENSOR) MISC 1 Device by Does not apply route once a week. 10/08/14   Renato Shin, MD  glucose blood (BAYER CONTOUR NEXT TEST) test strip Test blood glucose 2-3 times a day. Dx code: 250.01 08/23/12   Renato Shin, MD  Insulin Infusion Pump Supplies (MINIMED INFUSION SET-MMT 399) MISC See admin instructions. Use with Humulin insulin - continuous - basal rate 0.625    [provider]     Allergies:     Allergies  Allergen Reactions  . Ciprofloxin Hcl [Ciprofloxacin] Nausea And Vomiting and Other (See Comments)    Severe migraine  . Food Color Red [Red Dye] Diarrhea     Physical Exam:   Vitals  Blood pressure 112/77, pulse 73, temperature 99.9 F (37.7 C), temperature source Oral, resp. rate 16, last menstrual period 03/12/2017, SpO2 99 %.   1. General  lying in bed in NAD,    2. Normal affect and insight, Not Suicidal or Homicidal, Awake Alert, Oriented X 3.  3. No F.N deficits, ALL C.Nerves Intact, Strength 5/5 all 4 extremities, Sensation intact all 4 extremities, Plantars down going.  4. Ears and Eyes appear Normal, Conjunctivae clear, PERRLA. Moist Oral Mucosa.  5. Supple Neck, No JVD, No cervical lymphadenopathy appriciated, No Carotid Bruits.  6. Symmetrical Chest wall movement, Good  air movement bilaterally, CTAB.  7. RRR, No Gallops, Rubs or Murmurs, No Parasternal Heave.  8. Positive Bowel Sounds, Abdomen Soft, No tenderness, No organomegaly appriciated,No  rebound -guarding or rigidity.  9.  No Cyanosis, Normal Skin Turgor, No Skin Rash or Bruise.  10. Good muscle tone,  joints appear normal , no effusions, Normal ROM.  11. No Palpable Lymph Nodes in Neck or Axillae    Data Review:    CBC Recent Labs  Lab 03/15/17 1313 03/15/17 1329 03/15/17 2012 03/16/17 0305 03/17/17 1556  WBC 15.1*  --   --  14.7* 5.8  HGB 13.6 14.6 12.9 10.8* 11.4*  HCT 39.8 43.0 38.0 32.0* 34.3*  PLT 538*  --   --  400 420*  MCV 90.5  --   --  91.2 92.0  MCH 30.9  --   --  30.8 30.6  MCHC 34.2  --   --  33.8 33.2  RDW 12.9  --   --  13.1 13.1  LYMPHSABS 0.9  --   --   --  2.6  MONOABS 0.4  --   --   --  0.3  EOSABS 0.0  --   --   --  0.2  BASOSABS 0.0  --   --   --  0.0   ------------------------------------------------------------------------------------------------------------------  Chemistries  Recent Labs  Lab 03/15/17 1313 03/15/17 1329 03/15/17 2012 03/15/17 2342 03/16/17 0305 03/17/17 1556  NA 134* 136 140 139 139 138  K 4.5 4.5 4.4 4.7 4.5 3.7  CL 102 105 110 112* 111 98*  CO2 15*  --   --  16* 18* 21*  GLUCOSE 381* 360* 293* 266* 264* 288*  BUN 22* 24* 25* _0 CREATININE 1.51* 1.20* 0.80 1.06* 1.00 0.83  CALCIUM 9.8  --   --  8.4* 8.6* 8.2*  AST 31  --   --   --   --  50*  ALT 26  --   --   --   --  22  ALKPHOS 77  --   --   --   --  61  BILITOT 1.2  --   --   --   --  0.8   ------------------------------------------------------------------------------------------------------------------ estimated creatinine clearance is 83.9 mL/min (by C-G formula based on SCr of 0.83 mg/dL). ------------------------------------------------------------------------------------------------------------------ Recent Labs    03/16/17 0305  TSH 1.302     Coagulation profile Recent Labs  Lab 03/17/17 1556  INR 1.04   ------------------------------------------------------------------------------------------------------------------- No results for input(s): DDIMER in the last 72 hours. -------------------------------------------------------------------------------------------------------------------  Cardiac Enzymes No results for input(s): CKMB, TROPONINI, MYOGLOBIN in the last 168 hours.  Invalid input(s): CK ------------------------------------------------------------------------------------------------------------------ No results found for: BNP   ---------------------------------------------------------------------------------------------------------------  Urinalysis    Component Value Date/Time   COLORURINE STRAW (A) 03/17/2017 [place] 03/17/2017 1555   LABSPEC 1.003 (L) 03/17/2017 1555   PHURINE 7.0 03/17/2017 1555   GLUCOSEU 150 (A) 03/17/2017 1555   GLUCOSEU NEGATIVE 11/23/2015 0831   HGBUR SMALL (A) 03/17/2017 1555   BILIRUBINUR NEGATIVE 03/17/2017 1555   BILIRUBINUR neg 12/07/2015 0843   KETONESUR NEGATIVE 03/17/2017 1555   PROTEINUR NEGATIVE 03/17/2017 1555   UROBILINOGEN 1.0 12/07/2015 0843   UROBILINOGEN 0.2 11/23/2015 0831   NITRITE NEGATIVE 03/17/2017 1555   LEUKOCYTESUR NEGATIVE 03/17/2017 1555    ----------------------------------------------------------------------------------------------------------------   Imaging Results:    No results found.   Assessment & Plan:    Principal Problem:   Bacteremia Active Problems:   Type 1 diabetes mellitus (HCC)   Abnormal liver function test   Anemia    Bacteremia, 1/2 blood culture + Gram negative rod Check ESR  Check cardiac echo Repeat blood culture x2 pending Await sensitivities to tailor abx Start Zosyn iv pharmacy to dose Check cbc in am  Abnormal liver function test Prior CT scan 12/31/2016=> + hepatic  steatosis Acute hepatitis panel Check cmp in am  Anemia Consider check b12, folate, ferritin, iron, tibc, esr, tsh, spep, upep if Hgb worsens Check cbc in am  Type 1 dm fsbs ac and qhs, ISS  Anxiety Cont fluoxetine Cont wellbutrin  Hypothyroidism Cont levothyroxine  Hayfever Cont singulair   DVT Prophylaxis   Lovenox - SCDs  AM Labs Ordered, also please review Full Orders  Family Communication: Admission, patients condition and plan of care including tests being ordered have been discussed with the patient who indicate understanding and agree with the plan and Code Status.  Code Status FULL CODE  Likely DC to  home  Condition GUARDED    Consults called: none  Admission status:  observation   Time spent in minutes : 45   Jani Gravel M.D on 03/17/2017 at 6:43 PM  Between 7am to 7pm - Pager - [phone]  . After 7pm go to [email or web address] - password [facility]  [facility] - Office  [phone]

## 2017-03-17 NOTE — ED Notes (Signed)
ED Provider at bedside. 

## 2017-03-17 NOTE — ED Notes (Signed)
Attempted report x1. RN unavailable @ this time. Callback # left. 

## 2017-03-18 ENCOUNTER — Telehealth: Payer: Self-pay | Admitting: Physician Assistant

## 2017-03-18 ENCOUNTER — Encounter (HOSPITAL_COMMUNITY): Payer: Self-pay | Admitting: General Practice

## 2017-03-18 ENCOUNTER — Other Ambulatory Visit: Payer: Self-pay

## 2017-03-18 DIAGNOSIS — I5181 Takotsubo syndrome: Secondary | ICD-10-CM

## 2017-03-18 DIAGNOSIS — I214 Non-ST elevation (NSTEMI) myocardial infarction: Secondary | ICD-10-CM

## 2017-03-18 LAB — GLUCOSE, CAPILLARY
Glucose-Capillary: 101 mg/dL — ABNORMAL HIGH (ref 65–99)
Glucose-Capillary: 162 mg/dL — ABNORMAL HIGH (ref 65–99)
Glucose-Capillary: 198 mg/dL — ABNORMAL HIGH (ref 65–99)
Glucose-Capillary: 216 mg/dL — ABNORMAL HIGH (ref 65–99)

## 2017-03-18 LAB — COMPREHENSIVE METABOLIC PANEL
ALT: 20 U/L (ref 14–54)
AST: 28 U/L (ref 15–41)
Albumin: 3 g/dL — ABNORMAL LOW (ref 3.5–5.0)
Alkaline Phosphatase: 51 U/L (ref 38–126)
Anion gap: 11 (ref 5–15)
BUN: 6 mg/dL (ref 6–20)
CHLORIDE: 106 mmol/L (ref 101–111)
CO2: 24 mmol/L (ref 22–32)
CREATININE: 0.81 mg/dL (ref 0.44–1.00)
Calcium: 8.4 mg/dL — ABNORMAL LOW (ref 8.9–10.3)
GFR calc Af Amer: >60 mL/min (ref >60)
GFR calc non Af Amer: >60 mL/min (ref >60)
Glucose, Bld: 152 mg/dL — ABNORMAL HIGH (ref 65–99)
Potassium: 3.3 mmol/L — ABNORMAL LOW (ref 3.5–5.1)
SODIUM: 141 mmol/L (ref 135–145)
Total Bilirubin: 0.7 mg/dL (ref 0.3–1.2)
Total Protein: 5.6 g/dL — ABNORMAL LOW (ref 6.5–8.1)

## 2017-03-18 LAB — CBC
HCT: 32.2 % — ABNORMAL LOW (ref 36.0–46.0)
Hemoglobin: 10.6 g/dL — ABNORMAL LOW (ref 12.0–15.0)
MCH: 30.1 pg (ref 26.0–34.0)
MCHC: 32.9 g/dL (ref 30.0–36.0)
MCV: 91.5 fL (ref 78.0–100.0)
Platelets: 357 10*3/uL (ref 150–400)
RBC: 3.52 MIL/uL — AB (ref 3.87–5.11)
RDW: 12.9 % (ref 11.5–15.5)
WBC: 6 10*3/uL (ref 4.0–10.5)

## 2017-03-18 LAB — TROPONIN I
TROPONIN I: 1.22 ng/mL — AB (ref <0.03)
TROPONIN I: 1.19 ng/mL — AB (ref <0.03)
Troponin I: 1.24 ng/mL (ref <0.03)

## 2017-03-18 LAB — HEPARIN LEVEL (UNFRACTIONATED): Heparin Unfractionated: 0.51 IU/mL (ref 0.30–0.70)

## 2017-03-18 LAB — RAPID URINE DRUG SCREEN, HOSP PERFORMED
AMPHETAMINES: NOT DETECTED
BENZODIAZEPINES: NOT DETECTED
Barbiturates: NOT DETECTED
COCAINE: NOT DETECTED
OPIATES: NOT DETECTED
TETRAHYDROCANNABINOL: POSITIVE — AB

## 2017-03-18 MED ORDER — ASPIRIN EC 81 MG PO TBEC
81.0000 mg | DELAYED_RELEASE_TABLET | Freq: Every day | ORAL | Status: DC
  Administered 2017-03-18 – 2017-03-22 (×5): 81 mg via ORAL
  Filled 2017-03-18 (×5): qty 1

## 2017-03-18 MED ORDER — NITROGLYCERIN 0.4 MG SL SUBL
0.4000 mg | SUBLINGUAL_TABLET | SUBLINGUAL | Status: DC | PRN
  Administered 2017-03-18: 0.4 mg via SUBLINGUAL
  Filled 2017-03-18: qty 1

## 2017-03-18 MED ORDER — HEPARIN (PORCINE) IN NACL 100-0.45 UNIT/ML-% IJ SOLN
1000.0000 [IU]/h | INTRAMUSCULAR | Status: DC
  Administered 2017-03-18: 1000 [IU]/h via INTRAVENOUS
  Filled 2017-03-18 (×3): qty 250

## 2017-03-18 MED ORDER — HEPARIN BOLUS VIA INFUSION
4000.0000 [IU] | Freq: Once | INTRAVENOUS | Status: AC
  Administered 2017-03-18: 4000 [IU] via INTRAVENOUS
  Filled 2017-03-18: qty 4000

## 2017-03-18 MED ORDER — MORPHINE SULFATE (PF) 2 MG/ML IV SOLN
2.0000 mg | INTRAVENOUS | Status: DC | PRN
  Administered 2017-03-18: 2 mg via INTRAVENOUS
  Filled 2017-03-18: qty 1

## 2017-03-18 MED ORDER — ONDANSETRON HCL 4 MG/2ML IJ SOLN: 4.0000 mg | Freq: Four times a day (QID) | INTRAMUSCULAR | Status: DC | PRN

## 2017-03-18 NOTE — Progress Notes (Signed)
[place] for Heparin Indication: chest pain/ACS  Allergies  Allergen Reactions  . Ciprofloxin Hcl [Ciprofloxacin] Nausea And Vomiting and Other (See Comments)    Severe migraine  . Food Color Red [Red Dye] Diarrhea    Patient Measurements: Weight: 172 lb 14.2 oz (78.4 kg) Heparin Dosing Weight: 73 kg  Vital Signs: Temp: 98 F (36.7 C) (02/24 1745) Temp Source: Oral (02/24 1745) BP: 108/66 (02/24 1745) Pulse Rate: 68 (02/24 1745)  Labs: Recent Labs    03/16/17 0305 03/17/17 1556 03/18/17 0403 03/18/17 0823 03/18/17 1628 03/18/17 1824  HGB 10.8* 11.4* 10.6*  --   --   --   HCT 32.0* 34.3* 32.2*  --   --   --   PLT 400 420* 357  --   --   --   LABPROT  --  13.5  --   --   --   --   INR  --  1.04  --   --   --   --   HEPARINUNFRC  --   --   --   --   --  0.51  CREATININE 1.00 0.83 0.81  --   --   --   TROPONINI  --   --   --  1.24* 1.22*  --     Estimated Creatinine Clearance: 88 mL/min (by C-G formula based on SCr of 0.81 mg/dL).   Medical History: Past Medical History:  Diagnosis Date  . Acute sinusitis 08/07/2015  . Allergic state 08/07/2015  . Anxiety   . Depression   . Diabetes mellitus   . High cholesterol   . Hypothyroidism     Medications:  Scheduled:  . aspirin EC  81 mg Oral Daily  . atorvastatin  10 mg Oral Daily  . buPROPion  300 mg Oral q morning - 10a  . cyclobenzaprine  10 mg Oral QHS  . FLUoxetine  30 mg Oral Daily  . fluticasone  2 spray Each Nare Daily  . insulin pump   Subcutaneous TID AC, HS, 0200  . levothyroxine  112 mcg Oral QAC breakfast  . montelukast  10 mg Oral QHS    Assessment: 47 yo female presented to ED on 2/22 with SIRS 2/2 nausea and vomiting. Placed on supportive fluid therapy for viral infection and discharged to home. Advised to return to hospital on 2/23 for antimicrobial treatment for gram negative rod growth in 1/2 cultures. Seen by cardiology today for abnormal Echo and EKG  during admission.  -initial heparin level is at goal  Goal of Therapy:  Heparin level 0.3-0.7 units/ml Monitor platelets by anticoagulation protocol: Yes   Plan: -no heparin changes needed -Daily heparin level and CBC  Hildred Laser, Pharm D 03/18/2017 8:21 PM

## 2017-03-18 NOTE — Significant Event (Addendum)
Rapid Response Event Note  Overview: Time Called: 2058 Arrival Time: 2100 Event Type: Cardiac  Initial Focused Assessment: Active Chest Pain Called by RN about patient having acute onset of chest pain (5/10), pain left anterior chest. Earlier this am, patient had T-wave inversion on telemetry but did not have any symptoms (no CP or SOB). Her troponin levels today did bump 1.24 > 1.22, patient was placed on ASA and IV Heparin (+NSTEMI) and plan is for CTA Cardiac tomorrow.  When I arrived, RN had received orders for NTG SL X 1 and Morphine 2mg  IV.  I asked the RNs to obtain to EKG and I paged [facility] NP for orders for Zofran, transfer order to stepdown, and I informed him that I would reach out to CARDS MD to review the EKG. I called Dr. Raiford Simmonds and asked him to review the EKG, he came and saw the patient and reviewed the EKG. Heart and lung sounds were clear, skin was warm and dry, + pulses, patient is alert and neuro intact.  CP subsided after NTG and Morphine were given, brief period of nausea occurred but it quickly improved. Patient is being worked up for Takotsubo DCM.    Interventions: -- EKG STAT - new changes, reviewed by Cards MD -- NTG SL x 1 -- Morphine 2mg   IV -- Ordered Zofran PRN   Plan of Care (if not transferred): -- Transfer to [facility] for SDU/Active CP.  Event Summary: Name of Physician Notified:  Alger Memos NP by Primary RN and by RR RN at 2105  Name of Consulting Physician Notified: Dr. Raiford Simmonds (Cards MD) at 2130  Outcome: Transferred (Comment)  Event End Time: [place], [place]

## 2017-03-18 NOTE — Progress Notes (Signed)
Patient having inverted T waves on telemetry. VS 98.1-66-16-102/60. No complaints of CP or SOB. Asymptomatic. Alger Memos, NP notified. Orders received. Will continue to monitor.

## 2017-03-18 NOTE — Progress Notes (Signed)
ANTICOAGULATION CONSULT NOTE - Initial Consult  Pharmacy Consult for Heparin Indication: chest pain/ACS  Allergies  Allergen Reactions  . Ciprofloxin Hcl [Ciprofloxacin] Nausea And Vomiting and Other (See Comments)    Severe migraine  . Food Color Red [Red Dye] Diarrhea    Patient Measurements: Weight: 172 lb 14.2 oz (78.4 kg) Heparin Dosing Weight: 73 kg  Vital Signs: Temp: 98.1 F (36.7 C) (02/24 0427) Temp Source: Oral (02/24 0427) BP: 102/60 (02/24 0427) Pulse Rate: 66 (02/24 0427)  Labs: Recent Labs    03/16/17 0305 03/17/17 1556 03/18/17 0403 03/18/17 0823  HGB 10.8* 11.4* 10.6*  --   HCT 32.0* 34.3* 32.2*  --   PLT 400 420* 357  --   LABPROT  --  13.5  --   --   INR  --  1.04  --   --   CREATININE 1.00 0.83 0.81  --   TROPONINI  --   --   --  1.24*    Estimated Creatinine Clearance: 88 mL/min (by C-G formula based on SCr of 0.81 mg/dL).   Medical History: Past Medical History:  Diagnosis Date  . Acute sinusitis 08/07/2015  . Allergic state 08/07/2015  . Anxiety   . Depression   . Diabetes mellitus   . High cholesterol   . Hypothyroidism     Medications:  Scheduled:  . aspirin EC  81 mg Oral Daily  . atorvastatin  10 mg Oral Daily  . buPROPion  300 mg Oral q morning - 10a  . cyclobenzaprine  10 mg Oral QHS  . enoxaparin (LOVENOX) injection  40 mg Subcutaneous Q24H  . FLUoxetine  30 mg Oral Daily  . fluticasone  2 spray Each Nare Daily  . heparin  4,000 Units Intravenous Once  . insulin pump   Subcutaneous TID AC, HS, 0200  . levothyroxine  112 mcg Oral QAC breakfast  . montelukast  10 mg Oral QHS    Assessment: 47 yo female presented to ED on 2/22 with SIRS 2/2 nausea and vomiting. Placed on supportive fluid therapy for viral infection and discharged to home. Advised to return to hospital on 2/23 for antimicrobial treatment for gram negative rod growth in 1/2 cultures. Seen by cardiology today for abnormal Echo and EKG during admission.    Troponin elevated at 1.24. Coronary CTA planned for tomorrow and will need a cardiac cath for evaluation of coronary anatomy once infection is under control. CBC stable, plts wnl, no bleeding reported.   Goal of Therapy:  Heparin level 0.3-0.7 units/ml Monitor platelets by anticoagulation protocol: Yes   Plan:  Heparin 4000 unit bolus IV x 1 Heparin 1,000 units/hr IV 6-hr heparin level, then daily Daily CBC  Leroy Libman, PharmD Pharmacy Resident Pager: [phone]

## 2017-03-18 NOTE — Progress Notes (Addendum)
PROGRESS NOTE  Jennifer Hogan KHT:[id number] DOB: 30-May-1970 DOA: 03/17/2017 PCP: Hoyt Koch, MD   LOS: 0 days   Brief Narrative / Interim history: 47 year old female with type 1 diabetes mellitus on chronic insulin pump, hypothyroidism, was recently admitted to the hospital in the setting of a GI illness with nausea vomiting diarrhea, felt to be dehydrated, received IV fluids and was discharged home.  During that admission underwent blood cultures that eventually speciated gram-negative rods, patient was called to come back.  She also underwent an echo which showed an depressed EF of 45-50%.  Assessment & Plan: Principal Problem:   Bacteremia Active Problems:   Type 1 diabetes mellitus (HCC)   Abnormal liver function test   Anemia   NSTEMI (non-ST elevated myocardial infarction) ([place])   Takotsubo cardiomyopathy   Gram-negative bacteremia on cultures on 2/21 -Possibly related to her GI illness, awaiting speciation, started on Zosyn for now -Received repeat blood cultures on 2/23 without growth to date -She is afebrile, nontoxic-appearing, asymptomatic with this,  Systolic CHF, suspect chronic -Unclear if acute or chronic, patient has been having intermittent chest discomfort as well as some shortness of breath with exertion for the past several months -2D echo done on 2/22 showed an EF of 45-50% with hypokinesis of the apical myocardium -Concern for Takatsubo cardiomyopathy -Cardiology consulted, she would eventually need a cath but given bacteremia there would prefer to do well coronary CT -Cycle cardiac enzymes -Continue aspirin and atorvastatin, placed on heparin infusion  Troponin elevation -Concern for NSTEMI, continue heparin  Type 1 diabetes mellitus -Continue insulin pump  Depression -Continue home medications, Wellbutrin, Prozac  Hypothyroidism -Continue Synthroid   DVT prophylaxis: heparin Code Status: Full code Family Communication: no family  at bedside Disposition Plan: home when stable  Consultants:   Cardiology   Procedures:   None   Antimicrobials:  Zosyn 2/23 >>   Subjective: - no chest pain, shortness of breath, no abdominal pain, nausea or vomiting.    Objective: Vitals:   03/17/17 1900 03/17/17 2000 03/17/17 2206 03/18/17 0427  BP: 110/72 114/82 (!) 111/55 102/60  Pulse: 61 62 81 66  Resp:   17 16  Temp:   98.2 F (36.8 C) 98.1 F (36.7 C)  TempSrc:   Oral Oral  SpO2: 100% 100% 100% 100%  Weight:   78.4 kg (172 lb 14.2 oz)     Intake/Output Summary (Last 24 hours) at 03/18/2017 1349 Last data filed at 03/18/2017 0432 Gross per 24 hour  Intake 2200 ml  Output 0 ml  Net 2200 ml   Filed Weights   03/17/17 2206  Weight: 78.4 kg (172 lb 14.2 oz)    Examination:  Constitutional: NAD Eyes: lids and conjunctivae normal ENMT: Mucous membranes are moist.  Neck: normal, supple Respiratory: clear to auscultation bilaterally, no wheezing, no crackles. Normal respiratory effort. Cardiovascular: Regular rate and rhythm, no murmurs / rubs / gallops.  Abdomen: no tenderness.   Skin: no rashes Neurologic: Nonfocal, ambulatory Psychiatric: Normal judgment and insight. Alert and oriented x 3. Normal mood.    Data Reviewed: I have independently reviewed following labs and imaging studies   CBC: Recent Labs  Lab 03/15/17 1313 03/15/17 1329 03/15/17 2012 03/16/17 0305 03/17/17 1556 03/18/17 0403  WBC 15.1*  --   --  14.7* 5.8 6.0  NEUTROABS 13.8*  --   --   --  2.8  --   HGB 13.6 14.6 12.9 10.8* 11.4* 10.6*  HCT 39.8 43.0 38.0 32.0*  34.3* 32.2*  MCV 90.5  --   --  91.2 92.0 91.5  PLT 538*  --   --  400 420* 937   Basic Metabolic Panel: Recent Labs  Lab 03/15/17 1313  03/15/17 2012 03/15/17 2342 03/16/17 0305 03/17/17 1556 03/18/17 0403  NA 134*   < > 140 139 139 138 141  K 4.5   < > 4.4 4.7 4.5 3.7 3.3*  CL 102   < > 110 112* 111 98* 106  CO2 15*  --   --  16* 18* 21* 24  GLUCOSE  381*   < > 293* 266* 264* 288* 152*  BUN 22*   < > 25* 19 20 7 6   CREATININE 1.51*   < > 0.80 1.06* 1.00 0.83 0.81  CALCIUM 9.8  --   --  8.4* 8.6* 8.2* 8.4*   < > = values in this interval not displayed.   GFR: Estimated Creatinine Clearance: 88 mL/min (by C-G formula based on SCr of 0.81 mg/dL). Liver Function Tests: Recent Labs  Lab 03/15/17 1313 03/17/17 1556 03/18/17 0403  AST 31 50* 28  ALT 26 22 20   ALKPHOS 77 61 51  BILITOT 1.2 0.8 0.7  PROT 8.4* 6.6 5.6*  ALBUMIN 4.7 3.5 3.0*   Recent Labs  Lab 03/15/17 1313  LIPASE 19   No results for input(s): AMMONIA in the last 168 hours. Coagulation Profile: Recent Labs  Lab 03/17/17 1556  INR 1.04   Cardiac Enzymes: Recent Labs  Lab 03/18/17 0823  TROPONINI 1.24*   BNP (last 3 results) No results for input(s): PROBNP in the last 8760 hours. HbA1C: No results for input(s): HGBA1C in the last 72 hours. CBG: Recent Labs  Lab 03/16/17 0619 03/16/17 0737 03/16/17 1203 03/18/17 0743 03/18/17 1130  GLUCAP 168* 166* 220* 101* 162*   Lipid Profile: No results for input(s): CHOL, HDL, LDLCALC, TRIG, CHOLHDL, LDLDIRECT in the last 72 hours. Thyroid Function Tests: Recent Labs    03/16/17 0305  TSH 1.302   Anemia Panel: No results for input(s): VITAMINB12, FOLATE, FERRITIN, TIBC, IRON, RETICCTPCT in the last 72 hours. Urine analysis:    Component Value Date/Time   COLORURINE STRAW (A) 03/17/2017 1555   APPEARANCEUR CLEAR 03/17/2017 1555   LABSPEC 1.003 (L) 03/17/2017 1555   PHURINE 7.0 03/17/2017 1555   GLUCOSEU 150 (A) 03/17/2017 1555   GLUCOSEU NEGATIVE 11/23/2015 0831   HGBUR SMALL (A) 03/17/2017 1555   BILIRUBINUR NEGATIVE 03/17/2017 1555   BILIRUBINUR neg 12/07/2015 0843   KETONESUR NEGATIVE 03/17/2017 1555   PROTEINUR NEGATIVE 03/17/2017 1555   UROBILINOGEN 1.0 12/07/2015 0843   UROBILINOGEN 0.2 11/23/2015 0831   NITRITE NEGATIVE 03/17/2017 1555   LEUKOCYTESUR NEGATIVE 03/17/2017 1555   Sepsis  Labs: Invalid input(s): PROCALCITONIN, LACTICIDVEN  Recent Results (from the past 240 hour(s))  Urine culture     Status: Abnormal   Collection Time: 03/15/17  9:30 PM  Result Value Ref Range Status   Specimen Description URINE, CATHETERIZED  Final   Special Requests NONE  Final   Culture (A)  Final    <10,000 COLONIES/mL INSIGNIFICANT GROWTH Performed at [facility], [address], [place], [place] [address]    Report Status 03/17/2017 FINAL  Final  Blood culture (routine x 2)     Status: None (Preliminary result)   Collection Time: 03/15/17 10:40 PM  Result Value Ref Range Status   Specimen Description BLOOD RIGHT ANTECUBITAL  Final   Special Requests   Final  BOTTLES DRAWN AEROBIC AND ANAEROBIC Blood Culture adequate volume   Culture  Setup Time   Final    GRAM NEGATIVE RODS AEROBIC BOTTLE ONLY CRITICAL RESULT CALLED TO, READ BACK BY AND VERIFIED WITH: Sarita Bottom AT 1353 03/17/17 L BENFIELD    Culture   Final    GRAM NEGATIVE RODS IDENTIFICATION AND SUSCEPTIBILITIES TO FOLLOW Performed at [facility], [place] [address] [place], [place] [address]    Report Status PENDING  Incomplete  Blood Culture ID Panel (Reflexed)     Status: None   Collection Time: 03/15/17 10:40 PM  Result Value Ref Range Status   Enterococcus species NOT DETECTED NOT DETECTED Final   Listeria monocytogenes NOT DETECTED NOT DETECTED Final   Staphylococcus species NOT DETECTED NOT DETECTED Final   Staphylococcus aureus NOT DETECTED NOT DETECTED Final   Streptococcus species NOT DETECTED NOT DETECTED Final   Streptococcus agalactiae NOT DETECTED NOT DETECTED Final   Streptococcus pneumoniae NOT DETECTED NOT DETECTED Final   Streptococcus pyogenes NOT DETECTED NOT DETECTED Final   Acinetobacter baumannii NOT DETECTED NOT DETECTED Final   Enterobacteriaceae species NOT DETECTED NOT DETECTED Final   Enterobacter cloacae complex NOT DETECTED NOT DETECTED Final   Escherichia coli NOT  DETECTED NOT DETECTED Final   Klebsiella oxytoca NOT DETECTED NOT DETECTED Final   Klebsiella pneumoniae NOT DETECTED NOT DETECTED Final   Proteus species NOT DETECTED NOT DETECTED Final   Serratia marcescens NOT DETECTED NOT DETECTED Final   Haemophilus influenzae NOT DETECTED NOT DETECTED Final   Neisseria meningitidis NOT DETECTED NOT DETECTED Final   Pseudomonas aeruginosa NOT DETECTED NOT DETECTED Final   Candida albicans NOT DETECTED NOT DETECTED Final   Candida glabrata NOT DETECTED NOT DETECTED Final   Candida krusei NOT DETECTED NOT DETECTED Final   Candida parapsilosis NOT DETECTED NOT DETECTED Final   Candida tropicalis NOT DETECTED NOT DETECTED Final    Comment: Performed at [facility], [place] [address], [place], [place] [address]  Blood culture (routine x 2)     Status: None (Preliminary result)   Collection Time: 03/15/17 11:46 PM  Result Value Ref Range Status   Specimen Description BLOOD LEFT ANTECUBITAL  Final   Special Requests   Final    BOTTLES DRAWN AEROBIC AND ANAEROBIC Blood Culture adequate volume   Culture   Final    NO GROWTH 1 DAY Performed at [facility], [place] [address] [place],  [address]    Report Status PENDING  Incomplete      Radiology Studies: No results found.   Scheduled Meds: . aspirin EC  81 mg Oral Daily  . atorvastatin  10 mg Oral Daily  . buPROPion  300 mg Oral q morning - 10a  . cyclobenzaprine  10 mg Oral QHS  . enoxaparin (LOVENOX) injection  40 mg Subcutaneous Q24H  . FLUoxetine  30 mg Oral Daily  . fluticasone  2 spray Each Nare Daily  . insulin pump   Subcutaneous TID AC, HS, 0200  . levothyroxine  112 mcg Oral QAC breakfast  . montelukast  10 mg Oral QHS   Continuous Infusions: . sodium chloride 100 mL/hr at 03/18/17 0020  . heparin 1,000 Units/hr (03/18/17 1217)  . piperacillin-tazobactam (ZOSYN)  IV 3.375 g (03/18/17 0851)     Marzetta Board, MD, PhD [facility] Pager [phone]  If  7PM-7AM, please contact night-coverage [email or web address] Password [facility] 03/18/2017, 1:49 PM

## 2017-03-18 NOTE — Telephone Encounter (Addendum)
Entered in error

## 2017-03-18 NOTE — Progress Notes (Signed)
Patient complaints of intermittent left chest pressure 5/10. VS 98.6-75-16-133/75. O2 sat 100% on RA. Alger Memos, NP notified. Will give Nitro SL recheck BP and then Morphine 2mg  IV. Paged Rapid response.

## 2017-03-18 NOTE — Consult Note (Addendum)
Cardiology Consultation:   [name]; [id number]; 20-Nov-1970   Admit date: 03/17/2017 Date of Consult: 03/18/2017  Primary Care Provider: Hoyt Koch, MD Primary Cardiologist: New to Dr. Radford Pax  Patient Profile:   Jennifer Hogan is a 47 y.o. female with a hx of type I diabetes, hyperlipidemia, hypothyroidism and family history of CAD who is being seen today for the evaluation of abnormal echocardiogram and EKG at the request of Dr. Cruzita Lederer.  Prior cardiac history.  History of Present Illness:   [name] presented to ER after called for 1 of her blood culture came back positive from last admission.  She was admitted overnight 2/22 for SIRS 2nd to nausea, vomiting and diarrhea.  She had this episode 2 days prior to presentation.  She was found to have tachycardia and elevated blood sugar with anion gap of 17.  Treated with fluid bolus. Her echocardiogram was done however discharge prior to result.   Preliminary blood culture 1/2 grown gram-negative rods.  Advised patient to came to ER for further evaluation.  Blood culture grown again and started on vancomycin and Zosyn.  Echocardiogram from last admission showed LV function of 45-50%, hypokinesis of apical myocardium and grade 2 diastolic dysfunction.  Finding consistent with takotsubo syndrome.  EKG this admission showed sinus rhythm with T wave inversion anterior inferior lateral lead -personally reviewed.  New from last admission.  Troponin I of 1.24. Lactic acid 2.76-->2.55. K of 3.3  Since discharge patient has not felt well.  However no chest pain or shortness of breath.  Further questioning patient reports chronic fatigue for the past 1 year.  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  She has noted fatigue without dyspnea or chest tightness during farm activity.  The patient recently moved from [place].  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  Recent  left Dr. program prior to relocation.  She is under extreme stress due to this.  Denies orthopnea, PND, syncope, lower extremity edema or melena.  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  Denies tobacco smoking.  Occasional marijuana abuse.  No cocaine history.  Father has a strong history of CAD requiring ICD.  He was smoker.  His cardiac history started in late 7s.  Past Medical History:  Diagnosis Date  . Acute sinusitis 08/07/2015  . Allergic state 08/07/2015  . Anxiety   . Depression   . Diabetes mellitus   . High cholesterol   . Hypothyroidism     Past Surgical History:  Procedure Laterality Date  . APPENDECTOMY      Inpatient Medications: Scheduled Meds: . atorvastatin  10 mg Oral Daily  . buPROPion  300 mg Oral q morning - 10a  . cyclobenzaprine  10 mg Oral QHS  . enoxaparin (LOVENOX) injection  40 mg Subcutaneous Q24H  . FLUoxetine  30 mg Oral Daily  . fluticasone  2 spray Each Nare Daily  . insulin pump   Subcutaneous TID AC, HS, 0200  . levothyroxine  112 mcg Oral QAC breakfast  . montelukast  10 mg Oral QHS   Continuous Infusions: . sodium chloride 100 mL/hr at 03/18/17 0020  . piperacillin-tazobactam (ZOSYN)  IV 3.375 g (03/18/17 0851)   PRN Meds: acetaminophen **OR** acetaminophen, albuterol, meloxicam, polyvinyl alcohol  Allergies:    Allergies  Allergen Reactions  . Ciprofloxin Hcl [Ciprofloxacin] Nausea And Vomiting and Other (See Comments)  Severe migraine  . Food Color Red [Red Dye] Diarrhea    Social History:   Social History   Socioeconomic History  . Marital status: Divorced    Spouse name: Not on file  . Number of children: Not on file  . Years of education: Not on file  . Highest education level: Not on file  Social Needs  . Financial resource strain: Not on file  . Food insecurity - worry: Not on file  . Food insecurity - inability: Not on file  . Transportation needs - medical: Not on file  . Transportation  needs - non-medical: Not on file  Occupational History  . Not on file  Tobacco Use  . Smoking status: Never Smoker  . Smokeless tobacco: Never Used  Substance and Sexual Activity  . Alcohol use: No    Alcohol/week: 0.0 oz  . Drug use: No  . Sexual activity: Yes    Birth control/protection: IUD  Other Topics Concern  . Not on file  Social History Narrative  . Not on file    Family History:   Family History  Problem Relation Age of Onset  . Coronary artery disease Father   . Cancer Mother        T cell lymphoma     ROS:  Please see the history of present illness.  All other ROS reviewed and negative.     Physical Exam/Data:   Vitals:   03/17/17 1900 03/17/17 2000 03/17/17 2206 03/18/17 0427  BP: 110/72 114/82 (!) 111/55 102/60  Pulse: 61 62 81 66  Resp:   17 16  Temp:   98.2 F (36.8 C) 98.1 F (36.7 C)  TempSrc:   Oral Oral  SpO2: 100% 100% 100% 100%  Weight:   172 lb 14.2 oz (78.4 kg)     Intake/Output Summary (Last 24 hours) at 03/18/2017 1059 Last data filed at 03/18/2017 0432 Gross per 24 hour  Intake 2200 ml  Output 0 ml  Net 2200 ml   Filed Weights   03/17/17 2206  Weight: 172 lb 14.2 oz (78.4 kg)   Body mass index is 28.77 kg/m.  General:  Well nourished, well developed, in no acute distress HEENT: normal Lymph: no adenopathy Neck: no JVD Endocrine:  No thryomegaly Vascular: No carotid bruits; FA pulses 2+ bilaterally without bruits  Cardiac:  normal S1, S2; RRR; no murmur  Lungs:  clear to auscultation bilaterally, no wheezing, rhonchi or rales  Abd: soft, nontender, no hepatomegaly  Ext: no edema Musculoskeletal:  No deformities, BUE and BLE strength normal and equal Skin: warm and dry  Neuro:  CNs 2-12 intact, no focal abnormalities noted Psych:  Normal affect    Telemetry:  Telemetry was personally reviewed and demonstrates: Sinus rhythm at controlled ventricular rate  Relevant CV Studies: Echo 03/16/17 Study Conclusions  - Left  ventricle: The cavity size was normal. Systolic function was   mildly reduced. The estimated ejection fraction was in the range   of 45% to 50%. Hypokinesis of the apical myocardium. Features are   consistent with a pseudonormal left ventricular filling pattern,   with concomitant abnormal relaxation and increased filling   pressure (grade 2 diastolic dysfunction). - Pulmonary arteries: Systolic pressure was mildly increased. PA   peak pressure: 37 mm Hg (S).  Impressions:  - Appearnce suggests &quot;apical ballooning&quot;. This could represent   ischemia due to stenosis in a &quot;wrap-around&quot; LAD artery or stress   cardiomyopathy (takotsubo syndrome).  Laboratory Data:  Chemistry  Recent Labs  Lab 03/16/17 0305 03/17/17 1556 03/18/17 0403  NA 139 138 141  K 4.5 3.7 3.3*  CL 111 98* 106  CO2 18* 21* 24  GLUCOSE 264* 288* 152*  BUN 20 7 6   CREATININE 1.00 0.83 0.81  CALCIUM 8.6* 8.2* 8.4*  GFRNONAA >60 >60 >60  GFRAA >60 >60 >60  ANIONGAP 10 19* 11    Recent Labs  Lab 03/15/17 1313 03/17/17 1556 03/18/17 0403  PROT 8.4* 6.6 5.6*  ALBUMIN 4.7 3.5 3.0*  AST 31 50* 28  ALT 26 22 20   ALKPHOS 77 61 51  BILITOT 1.2 0.8 0.7   Hematology Recent Labs  Lab 03/16/17 0305 03/17/17 1556 03/18/17 0403  WBC 14.7* 5.8 6.0  RBC 3.51* 3.73* 3.52*  HGB 10.8* 11.4* 10.6*  HCT 32.0* 34.3* 32.2*  MCV 91.2 92.0 91.5  MCH 30.8 30.6 30.1  MCHC 33.8 33.2 32.9  RDW 13.1 13.1 12.9  PLT 400 420* 357   Cardiac Enzymes Recent Labs  Lab 03/18/17 0823  TROPONINI 1.24*   No results for input(s): TROPIPOC in the last 168 hours.   Radiology/Studies:  Dg Chest Portable 1 View  Result Date: 03/15/2017 CLINICAL DATA:  Fever, abdominal pain, and nausea and vomiting for 2 days. EXAM: PORTABLE CHEST 1 VIEW COMPARISON:  None. FINDINGS: The cardiomediastinal silhouette is within normal limits. The lungs are well inflated and clear. There is no evidence of pleural effusion or  pneumothorax. No acute osseous abnormality is identified. IMPRESSION: No active disease. Electronically Signed   By: Logan Bores M.D.   On: 03/15/2017 17:09    Assessment and Plan:   1. NSTEMI - Troponin I of 1.24.  Chest tightness/shortness of breath however noted extreme fatigue with activity for past 1 year.  Progressively worsened.  Echocardiogram showed mildly depressed LV function with future consistence with  takotsubo cardiomyopathy.  Patient is under extreme stress due to work as noted above.  EKG today showed new T wave inversion anterior inferior lateral leads. -Patient will need a cardiac catheterization for definitive evaluation of coronary anatomy. However, will defer give bacteremia. Will do Coronary CTA tomorrow (please review reading MD - per rounding team on Monday).    2.  Bacteremia -1 out of 2 blood culture positive for gram-negative rods from last admission. -Blood cultures drawn again and started on empiric antibiotic.  3.  Chronic systolic heart failure -As above.  Pending echocardiogram this admission. She is euvolemic on exam.   4.  Type 1 diabetes Per admitting team  5.  Anemia -Evaluation per attending team  6. HLD - Check Lipid panel. Continue home lipitor for now.   7. Hypokalemia - likely dilutional from fluid bolus yesterday. Check BMET tomorrow.    For questions or updates, please contact [place] Please consult [email or web address] for contact info under Cardiology/STEMI.   Jarrett Soho, [place]  03/18/2017 10:59 AM

## 2017-03-19 ENCOUNTER — Inpatient Hospital Stay (HOSPITAL_COMMUNITY): Payer: Self-pay

## 2017-03-19 ENCOUNTER — Ambulatory Visit: Payer: Self-pay | Admitting: Physician Assistant

## 2017-03-19 ENCOUNTER — Telehealth: Payer: Self-pay | Admitting: Internal Medicine

## 2017-03-19 DIAGNOSIS — R0789 Other chest pain: Secondary | ICD-10-CM

## 2017-03-19 DIAGNOSIS — E7800 Pure hypercholesterolemia, unspecified: Secondary | ICD-10-CM

## 2017-03-19 LAB — BASIC METABOLIC PANEL
ANION GAP: 9 (ref 5–15)
BUN: <5 mg/dL — ABNORMAL LOW (ref 6–20)
CALCIUM: 8.2 mg/dL — AB (ref 8.9–10.3)
CO2: 26 mmol/L (ref 22–32)
Chloride: 105 mmol/L (ref 101–111)
Creatinine, Ser: 0.71 mg/dL (ref 0.44–1.00)
GFR calc Af Amer: >60 mL/min (ref >60)
GFR calc non Af Amer: >60 mL/min (ref >60)
GLUCOSE: 186 mg/dL — AB (ref 65–99)
POTASSIUM: 3.2 mmol/L — AB (ref 3.5–5.1)
Sodium: 140 mmol/L (ref 135–145)

## 2017-03-19 LAB — GLUCOSE, CAPILLARY
GLUCOSE-CAPILLARY: 223 mg/dL — AB (ref 65–99)
GLUCOSE-CAPILLARY: 196 mg/dL — AB (ref 65–99)
Glucose-Capillary: 197 mg/dL — ABNORMAL HIGH (ref 65–99)
Glucose-Capillary: 167 mg/dL — ABNORMAL HIGH (ref 65–99)
Glucose-Capillary: 220 mg/dL — ABNORMAL HIGH (ref 65–99)
Glucose-Capillary: 135 mg/dL — ABNORMAL HIGH (ref 65–99)
Glucose-Capillary: 271 mg/dL — ABNORMAL HIGH (ref 65–99)

## 2017-03-19 LAB — LIPID PANEL
CHOL/HDL RATIO: 3.5 ratio
Cholesterol: 197 mg/dL (ref 0–200)
HDL: 56 mg/dL (ref >40)
LDL CALC: 126 mg/dL — AB (ref 0–99)
Triglycerides: 74 mg/dL (ref <150)
VLDL: 15 mg/dL (ref 0–40)

## 2017-03-19 LAB — CBC
HCT: 31.2 % — ABNORMAL LOW (ref 36.0–46.0)
Hemoglobin: 10.3 g/dL — ABNORMAL LOW (ref 12.0–15.0)
MCH: 30.2 pg (ref 26.0–34.0)
MCHC: 33 g/dL (ref 30.0–36.0)
MCV: 91.5 fL (ref 78.0–100.0)
PLATELETS: 343 10*3/uL (ref 150–400)
RBC: 3.41 MIL/uL — ABNORMAL LOW (ref 3.87–5.11)
RDW: 13 % (ref 11.5–15.5)
WBC: 6.4 10*3/uL (ref 4.0–10.5)

## 2017-03-19 LAB — CULTURE, BLOOD (ROUTINE X 2): Special Requests: ADEQUATE

## 2017-03-19 LAB — HEPARIN LEVEL (UNFRACTIONATED): HEPARIN UNFRACTIONATED: 0.46 [IU]/mL (ref 0.30–0.70)

## 2017-03-19 LAB — TROPONIN I: Troponin I: 0.91 ng/mL (ref <0.03)

## 2017-03-19 LAB — MRSA PCR SCREENING: MRSA BY PCR: NEGATIVE

## 2017-03-19 MED ORDER — POTASSIUM CHLORIDE CRYS ER 20 MEQ PO TBCR
40.0000 meq | EXTENDED_RELEASE_TABLET | Freq: Once | ORAL | Status: AC
  Administered 2017-03-19: 40 meq via ORAL
  Filled 2017-03-19: qty 2

## 2017-03-19 MED ORDER — NITROGLYCERIN 0.4 MG SL SUBL
SUBLINGUAL_TABLET | SUBLINGUAL | Status: AC
  Filled 2017-03-19: qty 2

## 2017-03-19 MED ORDER — NITROGLYCERIN 0.4 MG SL SUBL
0.8000 mg | SUBLINGUAL_TABLET | Freq: Once | SUBLINGUAL | Status: AC
  Administered 2017-03-19: 0.8 mg via SUBLINGUAL

## 2017-03-19 MED ORDER — SODIUM CHLORIDE 0.9 % IV SOLN
1.0000 g | Freq: Three times a day (TID) | INTRAVENOUS | Status: DC
  Administered 2017-03-19 – 2017-03-22 (×8): 1 g via INTRAVENOUS
  Filled 2017-03-19 (×10): qty 1

## 2017-03-19 MED ORDER — CARVEDILOL 3.125 MG PO TABS
3.1250 mg | ORAL_TABLET | Freq: Two times a day (BID) | ORAL | Status: DC
  Administered 2017-03-19 – 2017-03-21 (×3): 3.125 mg via ORAL
  Filled 2017-03-19 (×3): qty 1

## 2017-03-19 MED ORDER — METOPROLOL TARTRATE 25 MG/10 ML ORAL SUSPENSION: 25.0000 mg | Freq: Once | ORAL | Status: DC

## 2017-03-19 MED ORDER — ATORVASTATIN CALCIUM 80 MG PO TABS
80.0000 mg | ORAL_TABLET | Freq: Every day | ORAL | Status: DC
  Administered 2017-03-20 – 2017-03-21 (×2): 80 mg via ORAL
  Filled 2017-03-19 (×2): qty 1

## 2017-03-19 MED ORDER — INSULIN ASPART 100 UNIT/ML ~~LOC~~ SOLN
0.0000 [IU] | Freq: Three times a day (TID) | SUBCUTANEOUS | Status: DC
  Administered 2017-03-20: 15 [IU] via SUBCUTANEOUS

## 2017-03-19 MED ORDER — INSULIN ASPART 100 UNIT/ML ~~LOC~~ SOLN
0.0000 [IU] | Freq: Every day | SUBCUTANEOUS | Status: DC
  Administered 2017-03-19: 3 [IU] via SUBCUTANEOUS

## 2017-03-19 MED ORDER — IOPAMIDOL (ISOVUE-370) INJECTION 76%
INTRAVENOUS | Status: AC
  Filled 2017-03-19: qty 100

## 2017-03-19 MED ORDER — LOSARTAN POTASSIUM 50 MG PO TABS
25.0000 mg | ORAL_TABLET | Freq: Every day | ORAL | Status: DC
  Filled 2017-03-19 (×4): qty 1

## 2017-03-19 MED ORDER — ALPRAZOLAM 0.5 MG PO TABS: 0.5000 mg | ORAL_TABLET | Freq: Once | ORAL | Status: DC

## 2017-03-19 MED ORDER — METOPROLOL TARTRATE 25 MG PO TABS
25.0000 mg | ORAL_TABLET | Freq: Once | ORAL | Status: AC
  Administered 2017-03-19: 25 mg via ORAL
  Filled 2017-03-19: qty 1

## 2017-03-19 NOTE — Progress Notes (Signed)
ANTIBIOTIC CONSULT NOTE - INITIAL  Pharmacy Consult for Merrem Indication: bacteremia  Allergies  Allergen Reactions  . Ciprofloxin Hcl [Ciprofloxacin] Nausea And Vomiting and Other (See Comments)    Severe migraine  . Food Color Red [Red Dye] Diarrhea    Patient Measurements: Height: 5\' 5"  (165.1 cm) Weight: 160 lb 15 oz (73 kg) IBW/kg (Calculated) : 57 Adjusted Body Weight:    Vital Signs: Temp: 99.4 F (37.4 C) (02/25 1111) Temp Source: Oral (02/25 1111) BP: 151/95 (02/25 1111) Pulse Rate: 67 (02/25 1111) Intake/Output from previous day: 02/24 0701 - 02/25 0700 In: 531.2 [P.O.:240; I.V.:91.2; IV Piggyback:200] Out: 1875 [Urine:1875] Intake/Output from this shift: No intake/output data recorded.  Labs: Recent Labs    03/17/17 1556 03/18/17 0403 03/19/17 0522  WBC 5.8 6.0 6.4  HGB 11.4* 10.6* 10.3*  PLT 420* 357 343  CREATININE 0.83 0.81 0.71   Estimated Creatinine Clearance: 86.1 mL/min (by C-G formula based on SCr of 0.71 mg/dL). No results for input(s): VANCOTROUGH, VANCOPEAK, VANCORANDOM, GENTTROUGH, GENTPEAK, GENTRANDOM, TOBRATROUGH, TOBRAPEAK, TOBRARND, AMIKACINPEAK, AMIKACINTROU, AMIKACIN in the last 72 hours.   Microbiology:   Medical History: Past Medical History:  Diagnosis Date  . Acute sinusitis 08/07/2015  . Allergic state 08/07/2015  . Anxiety   . Depression   . Diabetes mellitus   . High cholesterol   . Hypothyroidism    Assessment:  ID: Sepsis - Zosyn, Tmax 99.4. WBC down 14.7>6.4 today. Scr WNL.  Zosyn 2/23>>2/25 Merrem 2/25>>  2/23: BC x 2>> 2/21: BC x 2: Achromobacter denitrificans,  S Imipenen and Sulfa 2/21: BCID negative  Goal of Therapy:  Eradication of infection  Plan:  Add Merrem 1g IV q 8 hrs D/c Zosyn   Jennifer Hogan, PharmD, BCPS Clinical Staff Pharmacist Pager [phone]  Jennifer Hogan 03/19/2017,11:53 AM

## 2017-03-19 NOTE — Progress Notes (Signed)
ANTICOAGULATION CONSULT NOTE - Follow Up Consult  Pharmacy Consult for Heparin + Zosyn Indication: chest pain/ACS + GNR bacteremia  Allergies  Allergen Reactions  . Ciprofloxin Hcl [Ciprofloxacin] Nausea And Vomiting and Other (See Comments)    Severe migraine  . Food Color Red [Red Dye] Diarrhea    Patient Measurements: Height: 5\' 5"  (165.1 cm) Weight: 160 lb 15 oz (73 kg) IBW/kg (Calculated) : 57 Heparin Dosing Weight: 71.9 kg  Vital Signs: Temp: 98.7 F (37.1 C) (02/25 0349) Temp Source: Oral (02/25 0349) BP: 132/84 (02/25 0754) Pulse Rate: 113 (02/25 0754)  Labs: Recent Labs    03/17/17 1556 03/18/17 0403  03/18/17 1628 03/18/17 1824 03/18/17 2221 03/19/17 0522  HGB 11.4* 10.6*  --   --   --   --  10.3*  HCT 34.3* 32.2*  --   --   --   --  31.2*  PLT 420* 357  --   --   --   --  343  LABPROT 13.5  --   --   --   --   --   --   INR 1.04  --   --   --   --   --   --   HEPARINUNFRC  --   --   --   --  0.51  --  0.46  CREATININE 0.83 0.81  --   --   --   --  0.71  TROPONINI  --   --    < > 1.22*  --  1.19* 0.91*   < > = values in this interval not displayed.    Estimated Creatinine Clearance: 86.1 mL/min (by C-G formula based on SCr of 0.71 mg/dL).  Assessment:  Anticoag: Hep for ACS. Heparin level 0.46 in goal range. Hgb down to 10.3. Plts 343 ok.  ID: Sepsis - Zosyn, Tmax 99.4. WBC down 14.7>6.4 today. Scr WNL 2/23: BC x 2>> 2/21: BC x 2: GNR x 1 2/21: BCID negative  Goal of Therapy:  Heparin level 0.3-0.7 units/ml  Eradication of infection Monitor platelets by anticoagulation protocol: Yes   Plan:  Heparin 1,000 units/hr IV Daily CB, Heparin level Zosyn 3.375 gm IV x1 now infuse over 30 min then 3.375 gm IV  q8h (Extended infusion over 4h) Cardiac CT today   Tachina Spoonemore S. Alford Highland, PharmD, BCPS Clinical Staff Pharmacist Pager [phone]  Eilene Ghazi Stillinger 03/19/2017,9:59 AM

## 2017-03-19 NOTE — Progress Notes (Signed)
Inpatient Diabetes Program Recommendations  AACE/ADA: New Consensus Statement on Inpatient Glycemic Control (2015)  Target Ranges:  Prepandial:   less than 140 mg/dL      Peak postprandial:   less than 180 mg/dL (1-2 hours)      Critically ill patients:  140 - 180 mg/dL   Lab Results  Component Value Date   GLUCAP 223 (H) 03/19/2017   HGBA1C 8.0 11/23/2015    Review of Glycemic Control  Diabetes history: DM1 Outpatient Diabetes medications: Insulin pump Current orders for Inpatient glycemic control: Insulin pump  Inpatient Diabetes Program Recommendations:   -A1c to determine prehospital glycemic control  Will follow.  Thank you, Nani Gasser. Dayne Chait, RN, MSN, CDE  Diabetes Coordinator Inpatient Glycemic Control Team Team Pager [phone] (8am-5pm) 03/19/2017 8:33 AM

## 2017-03-19 NOTE — Telephone Encounter (Signed)
Patient called team health on 2/23 stating she was told to go back to the ED but was not told why.  States she was in the hospital for neurovirus and was still feeling nauseas.  States she was currently waiting in the ED and that blood was drawn.  States she had a blood infection and that she will probably get antibiotics.

## 2017-03-19 NOTE — Telephone Encounter (Signed)
noted 

## 2017-03-19 NOTE — Progress Notes (Signed)
Progress Note  Patient Name: Jennifer Hogan Date of Encounter: 03/19/2017  Primary Cardiologist: Dr. Radford Pax  Subjective   One episode of left sided chest pressure with radiation to her back. No associated symptoms. Reports the the pressure was constant in nature and has been intermittently going on for approximately one year, however acutely worsened overnight. Rapid response called for transport to Stepdown. She was given 1 SL NTG and morphine with complete relief. She is scheduled for coronary CT today. May need to add to cath board. Hep gtt and ASA started for ACS. She is currently asymptomatic with no CP.  Inpatient Medications    Scheduled Meds: . ALPRAZolam  0.5 mg Oral Once  . aspirin EC  81 mg Oral Daily  . atorvastatin  10 mg Oral Daily  . buPROPion  300 mg Oral q morning - 10a  . cyclobenzaprine  10 mg Oral QHS  . FLUoxetine  30 mg Oral Daily  . fluticasone  2 spray Each Nare Daily  . insulin pump   Subcutaneous TID AC, HS, 0200  . levothyroxine  112 mcg Oral QAC breakfast  . montelukast  10 mg Oral QHS   Continuous Infusions: . heparin 1,000 Units/hr (03/18/17 2200)  . piperacillin-tazobactam (ZOSYN)  IV 3.375 g (03/19/17 0607)   PRN Meds: acetaminophen **OR** acetaminophen, albuterol, meloxicam, morphine injection, nitroGLYCERIN, ondansetron (ZOFRAN) IV, polyvinyl alcohol   Vital Signs    Vitals:   03/18/17 2115 03/18/17 2206 03/18/17 2322 03/19/17 0349  BP: 134/76 (!) 149/83 (!) 142/81 116/73  Pulse: 78 78 80 74  Resp:  (!) 23 17 12   Temp:  98.8 F (37.1 C) 99.4 F (37.4 C) 98.7 F (37.1 C)  TempSrc:  Oral Oral Oral  SpO2: 99% 98% 97% 96%  Weight:  161 lb 9.6 oz (73.3 kg)  160 lb 15 oz (73 kg)  Height:  5\' 5"  (1.651 m)      Intake/Output Summary (Last 24 hours) at 03/19/2017 0746 Last data filed at 03/19/2017 6063 Gross per 24 hour  Intake 531.17 ml  Output 1875 ml  Net -1343.83 ml   Filed Weights   03/17/17 2206 03/18/17 2206 03/19/17  0349  Weight: 172 lb 14.2 oz (78.4 kg) 161 lb 9.6 oz (73.3 kg) 160 lb 15 oz (73 kg)    Physical Exam   General: Well developed, well nourished, NAD Skin: Warm, dry, intact  Head: Normocephalic, atraumatic, clear, moist mucus membranes. Neck: Negative for carotid bruits. No JVD Lungs:Clear to ausculation bilaterally. No wheezes, rales, or rhonchi. Breathing is unlabored. Cardiovascular: RRR with S1 S2. No murmurs, rubs, or gallops Abdomen: Soft, non-tender, non-distended with normoactive bowel sounds. No obvious abdominal masses. MSK: Strength and tone appear normal for age. 5/5 in all extremities Extremities: No edema. No clubbing or cyanosis. DP/PT pulses 2+ bilaterally Neuro: Alert and oriented. No focal deficits. No facial asymmetry. MAE spontaneously. Psych: Responds to questions appropriately with normal affect.    Labs    Chemistry Recent Labs  Lab 03/15/17 1313  03/16/17 0305 03/17/17 1556 03/18/17 0403  NA 134*   < > 139 138 141  K 4.5   < > 4.5 3.7 3.3*  CL 102   < > 111 98* 106  CO2 15*   < > 18* 21* 24  GLUCOSE 381*   < > 264* 288* 152*  BUN 22*   < > 20 7 6   CREATININE 1.51*   < > 1.00 0.83 0.81  CALCIUM 9.8   < >  8.6* 8.2* 8.4*  PROT 8.4*  --   --  6.6 5.6*  ALBUMIN 4.7  --   --  3.5 3.0*  AST 31  --   --  50* 28  ALT 26  --   --  22 20  ALKPHOS 77  --   --  61 51  BILITOT 1.2  --   --  0.8 0.7  GFRNONAA 40*   < > >60 >60 >60  GFRAA 46*   < > >60 >60 >60  ANIONGAP 17*   < > 10 19* 11   < > = values in this interval not displayed.     Hematology Recent Labs  Lab 03/17/17 1556 03/18/17 0403 03/19/17 0522  WBC 5.8 6.0 6.4  RBC 3.73* 3.52* 3.41*  HGB 11.4* 10.6* 10.3*  HCT 34.3* 32.2* 31.2*  MCV 92.0 91.5 91.5  MCH 30.6 30.1 30.2  MCHC 33.2 32.9 33.0  RDW 13.1 12.9 13.0  PLT 420* 357 343    Cardiac Enzymes Recent Labs  Lab 03/18/17 0823 03/18/17 1628 03/18/17 2221  TROPONINI 1.24* 1.22* 1.19*   No results for input(s): TROPIPOC in the  last 168 hours.   BNPNo results for input(s): BNP, PROBNP in the last 168 hours.   DDimer No results for input(s): DDIMER in the last 168 hours.   Radiology    No results found.  Telemetry    03/18/17 NSR HR 76 ST depression on tele - Personally Reviewed  ECG    03/18/17 NSR with T wave inversion in I, II, III, aVF. Deep inversion in V4-V6 - Personally Reviewed  Cardiac Studies   Echo 03/16/17 Study Conclusions  - Left ventricle: The cavity size was normal. Systolic function was mildly reduced. The estimated ejection fraction was in the range of 45% to 50%. Hypokinesis of the apical myocardium. Features are consistent with a pseudonormal left ventricular filling pattern, with concomitant abnormal relaxation and increased filling pressure (grade 2 diastolic dysfunction). - Pulmonary arteries: Systolic pressure was mildly increased. PA peak pressure: 37 mm Hg (S).  Impressions: - Appearnce suggests &quot;apical ballooning&quot;. This could representischemia due to stenosis in a &quot;wrap-around&quot; LAD artery or stress cardiomyopathy (takotsubo syndrome).  Patient Profile     47 y.o. female with a hx of type I diabetes, hyperlipidemia, hypothyroidism and family history of CAD who is being seen today for the evaluation of abnormal echocardiogram and EKG at the request of Dr. Cruzita Lederer.  Assessment & Plan    1. 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 -Scheduled for cardiac CT today, will discuss with MD given her CP overnight. She may need cardiac cath instead. Given the abundance of add-on cases today, will proceed with CTA unless otherwise specified.  -NPO -ASA -Will consider adding low dose Coreg given normalized BP overnight  2.  Bacteremia: -Empiric IV Abx given + BC's gram negative rods -Afebrile -WBC, 6.4  3. Chronic systolic heart failure: -Echo 03/16/17 with decreased Ef to 45-50% with WMA, hypokinesis -No s/s of fluid vol;ume overload -Consider adding ACE given stable Cr (0.71) and normalized BP 132/84>116/73>142/81>149/83  4. Type I DM: -Per primary, insulin pump  5. Anemia: -CBC, Hb, 10.3 today   6. HLD: -Lipid panel, CHO-197, HDL-56, LDL-126, Trig-74 -Statin  7. Hypokalemia: -K+, 3.2 today -Will give 40 meq K+ PO this AM  Signed, Kathyrn Drown NP-C [facility] Pager: [phone] 03/19/2017, 7:46 AM     For questions or updates, please contact   Please consult [email or web address] for contact info under Cardiology/STEMI.

## 2017-03-19 NOTE — Progress Notes (Signed)
   Cardiac CT completed 03/19/17. Dr. Meda Coffee, CTA reader, recommends proceeding with cardiac cath given abnormal test. Spoke with Dr. Meda Coffee who has concern for proximal LAD disease based on results. Pt has been febrile with bacteremia, BC positive for gram neg rods. Will proceed with cath once she has been afebrile for 24-48h.   Thank you- Kathyrn Drown NP-C [facility] Pager: [phone]

## 2017-03-19 NOTE — Progress Notes (Signed)
Pt no longer able to use insulin pump because a piece from it detatched. Night MD paged and made aware of pump no longer being in use. Awaiting further orders.

## 2017-03-19 NOTE — Progress Notes (Addendum)
Attending MD note  Patient was seen, examined,treatment plan was discussed with the PA-S.  I have personally reviewed the clinical findings, lab, imaging studies and management of this patient in detail. I agree with the documentation, as recorded by the PA-S  Patient is 47 year old female with type 1 diabetes mellitus on chronic insulin pump, hypothyroidism, was recently admitted to the hospital in the setting of a GI illness with nausea vomiting diarrhea, felt to be dehydrated, received IV fluids and was discharged home.  During that admission underwent blood cultures that eventually speciated gram-negative rods, patient was called to come back.  She also underwent an echo which showed an depressed EF of 45-50%.    On Exam: Gen. exam: Awake, alert, not in any distress Chest: Good air entry bilaterally, no rhonchi or rales CVS: S1-S2 regular, no murmurs Abdomen: Soft, nontender and nondistended Neurology: Non-focal Skin: No rash or lesions  Plan  Achromobacter Denitrificans bacteremia on 1/2 cultures on 2/21 -Most likely related to her transient GI illness.  Antibiogram shows sensitive to carbapenems as well as Bactrim -Discussed with infectious disease over the phone, recommend a carbapenem for now while in house and potentially can be transitioned to Bactrim.  Recommends a 5-7-day course total. -Surveillance cultures remain negative, she is afebrile and asymptomatic currently  Systolic CHF, suspect chronic -Unclear if acute or chronic, patient has been having intermittent chest discomfort as well as some shortness of breath with exertion for the past several months -2D echo done on 2/22 showed an EF of 45-50% with hypokinesis of the apical myocardium -Concern for Takatsubo cardiomyopathy -Cardiology consulted, she would eventually need a cath but given bacteremia there would prefer to do well coronary CT -Cycle cardiac enzymes -Continue aspirin and atorvastatin, placed on heparin  infusion -Chest pain overnight requiring transfer to stepdown  Troponin elevation -Concern for NSTEMI, continue heparin -Cardiac enzymes remain flat however significantly elevated  Type 1 diabetes mellitus -Continue insulin pump, CBGs fairly decent control  Depression -Continue home medications, Wellbutrin, Prozac  Hypothyroidism -Continue Synthroid    Rest as above  Costin M. Cruzita Lederer, MD [facility] [phone]  If 7PM-7AM, please contact night-coverage [email or web address] Password [email or web address]      Progress Note   LYRIK DOCKSTADER NIO:[id number] DOB: [birth year] DOA: 03/17/2017 PCP: Hoyt Koch, MD   LOS: 1 day    Brief Narrative:   Deyonna Fitzsimmons is a 47 year old female with a history significant for T1DM, hyperlipidemia, hypothyroidism, and depression. She was initially admitted 2/22 for GI illness with N/V/D. She received IV fluids and was discharged. She also had an Echo on 2/22 that showed a depressed EF of 45-50% with a significance for apical ballooning representing ischemia due to stenosis in LAD artery or stress cardiomyopathy (takotsubo syndrome). The results of the blood cultures that were drawn during that admittance noted positive growth of gram-negative rods after her discharge. Patient was then called to return to the hospital   Assessment/Plan:   Principal Problem:   Bacteremia Active Problems:   Type 1 diabetes mellitus (HCC)   Abnormal liver function test   Anemia   NSTEMI (non-ST elevated myocardial infarction) ([place])   Takotsubo cardiomyopathy   Pure hypercholesterolemia   Gram-negative bacteremia on cultures on 2/21 - Was started on empiric IV antibiotics, discontinued. - Initial blood culture organism identification for Achromobacter Denitrificans, begin treatment with meropenem. - Received repeat blood cultures on 2/23 without growth to date. - She is afebrile, nontoxic-appearing, asymptomatic.  Systolic CHF - Patient  reported  transient shoulder and chest pain  - Echo completed on 2/22 showed an EF of 45-50% with hypokinesis of the apical myocardium -Concern for Takatsubo cardiomyopathy -Cardiology consulted, cardiac catheterization recommended, however cardiology felt given the setting of bacteremia to hold this procedure. - Cardiology to do a coronary CTA to determine level of ischemia in LAD given presentation of recent NSTEMI. - Cycle cardiac enzymes - Continue aspirin and atorvastatin, placed on heparin infusion.  NSTEMI - Troponin levels this admission 1.24>1.22>1.19>0.91. - EKG showed T-wave inversion in II, III, aVF, V4-V6. - Echo completed on 2/22 showed an EF of 45-50% with hypokinesis of the apical myocardium. - Continue NPO for coronary CTA.  Hypokalemia - 03/19/17 K+ at 3.2. - Manage with potassium replenishment 40 mEq.  Type 1 diabetes mellitus - Continue management with insulin pump.  Depression - Continue management with home medications: Wellbutrin and Prozac.  Hypothyroidism - Continue management with Synthroid.   Family Communication/Anticipated D/C date and plan/Code Status   DVT prophylaxis: heparin Code Status: Full code Family Communication: Discussed with the patient Disposition Plan: Home once stable   Medical Consultants:    Cardiology   Procedures:   Echo 03/16/17: Study Conclusions  - Left ventricle: The cavity size was normal. Systolic function was mildly reduced. The estimated ejection fraction was in the range of 45% to 50%. Hypokinesis of the apical myocardium. Features are consistent with a pseudonormal left ventricular filling pattern, with concomitant abnormal relaxation and increased filling pressure (grade 2 diastolic dysfunction). - Pulmonary arteries: Systolic pressure was mildly increased. PA peak pressure: 37 mm Hg (S).  Impressions: - Appearnce suggests &quot;apical ballooning&quot;. This could representischemia  due to stenosis in a &quot;wrap-around&quot; LAD artery or stress cardiomyopathy (takotsubo syndrome).   Antimicrobials:    Meropenem   Subjective:   Patient noted this morning that she doesn't feel any chest pain, tightness, neck pain, fatigue, myalgia, or abdominal pain. She notes a transient HA persistent since her admission. She also described that she is currently dealing with a significant level of stress.   Objective:   Vitals:   03/19/17 0349 03/19/17 0754 03/19/17 1111 03/19/17 1238  BP: 116/73 132/84 (!) 151/95   Pulse: 74 (!) 113 67 72  Resp: 12 18 13    Temp: 98.7 F (37.1 C)  99.4 F (37.4 C)   TempSrc: Oral  Oral   SpO2: 96% 99% 99%   Weight: 73 kg (160 lb 15 oz)     Height:        Intake/Output Summary (Last 24 hours) at 03/19/2017 1304 Last data filed at 03/19/2017 4098 Gross per 24 hour  Intake 531.17 ml  Output 1250 ml  Net -718.83 ml   Filed Weights   03/17/17 2206 03/18/17 2206 03/19/17 0349  Weight: 78.4 kg (172 lb 14.2 oz) 73.3 kg (161 lb 9.6 oz) 73 kg (160 lb 15 oz)     Physical Exam:   Constitutional: NAD Neck: normal, supple, no masses, no thyromegaly, no JVD. Respiratory: Clear to auscultation bilaterally, no wheezing, rales, or rhonchi, and no crackles. Normal respiratory effort. No accessory muscle use.  Cardiovascular: RRR, no murmurs / rubs / gallops. No LE edema. Positive cap refill. Abdomen: No ttp. Bowel sounds positive.  Musculoskeletal: No clubbing / cyanosis. Normal muscle tone.  Skin: No rashes or lesions. Neurologic: Strength 5/5 in all 4.  Psychiatric: Normal judgment and insight. Alert and oriented x 3. Normal mood.    Data Reviewed:   I have independently reviewed following labs and  imaging studies:   CBC: Recent Labs  Lab 2017/03/20 1313  03/20/2017 2012 03/16/17 0305 03/17/17 1556 03/18/17 0403 03/19/17 0522  WBC 15.1*  --   --  14.7* 5.8 6.0 6.4  NEUTROABS 13.8*  --   --   --  2.8  --   --   HGB 13.6   < >  12.9 10.8* 11.4* 10.6* 10.3*  HCT 39.8   < > 38.0 32.0* 34.3* 32.2* 31.2*  MCV 90.5  --   --  91.2 92.0 91.5 91.5  PLT 538*  --   --  400 420* 357 343   < > = values in this interval not displayed.   Basic Metabolic Panel: Recent Labs  Lab Mar 20, 2017 2342 03/16/17 0305 03/17/17 1556 03/18/17 0403 03/19/17 0522  NA 139 139 138 141 140  K 4.7 4.5 3.7 3.3* 3.2*  CL 112* 111 98* 106 105  CO2 16* 18* 21* 24 26  GLUCOSE 266* 264* 288* 152* 186*  BUN 19 20 7 6  <5*  CREATININE 1.06* 1.00 0.83 0.81 0.71  CALCIUM 8.4* 8.6* 8.2* 8.4* 8.2*   GFR: Estimated Creatinine Clearance: 86.1 mL/min (by C-G formula based on SCr of 0.71 mg/dL). Liver Function Tests: Recent Labs  Lab 03-20-17 1313 03/17/17 1556 03/18/17 0403  AST 31 50* 28  ALT 26 22 20   ALKPHOS 77 61 51  BILITOT 1.2 0.8 0.7  PROT 8.4* 6.6 5.6*  ALBUMIN 4.7 3.5 3.0*   Recent Labs  Lab 2017-03-20 1313  LIPASE 19   No results for input(s): AMMONIA in the last 168 hours. Coagulation Profile: Recent Labs  Lab 03/17/17 1556  INR 1.04   Cardiac Enzymes: Recent Labs  Lab 03/18/17 0823 03/18/17 1628 03/18/17 2221 03/19/17 0522  TROPONINI 1.24* 1.22* 1.19* 0.91*   BNP (last 3 results) No results for input(s): PROBNP in the last 8760 hours. HbA1C: No results for input(s): HGBA1C in the last 72 hours. CBG: Recent Labs  Lab 03/18/17 1640 03/18/17 2055 03/19/17 0229 03/19/17 0813 03/19/17 1140  GLUCAP 198* 216* 223* 220* 167*   Lipid Profile: Recent Labs    03/19/17 0522  CHOL 197  HDL 56  LDLCALC 126*  TRIG 74  CHOLHDL 3.5   Thyroid Function Tests: No results for input(s): TSH, T4TOTAL, FREET4, T3FREE, THYROIDAB in the last 72 hours. Anemia Panel: No results for input(s): VITAMINB12, FOLATE, FERRITIN, TIBC, IRON, RETICCTPCT in the last 72 hours. Urine analysis:    Component Value Date/Time   COLORURINE STRAW (A) 03/17/2017 1555   APPEARANCEUR CLEAR 03/17/2017 1555   LABSPEC 1.003 (L) 03/17/2017  1555   PHURINE 7.0 03/17/2017 1555   GLUCOSEU 150 (A) 03/17/2017 1555   GLUCOSEU NEGATIVE 11/23/2015 0831   HGBUR SMALL (A) 03/17/2017 1555   BILIRUBINUR NEGATIVE 03/17/2017 1555   BILIRUBINUR neg 12/07/2015 0843   KETONESUR NEGATIVE 03/17/2017 1555   PROTEINUR NEGATIVE 03/17/2017 1555   UROBILINOGEN 1.0 12/07/2015 0843   UROBILINOGEN 0.2 11/23/2015 0831   NITRITE NEGATIVE 03/17/2017 1555   LEUKOCYTESUR NEGATIVE 03/17/2017 1555   Sepsis Labs: Invalid input(s): PROCALCITONIN, LACTICIDVEN  Recent Results (from the past 240 hour(s))  Urine culture     Status: Abnormal   Collection Time: 2017-03-20  9:30 PM  Result Value Ref Range Status   Specimen Description URINE, CATHETERIZED  Final   Special Requests NONE  Final   Culture (A)  Final    <10,000 COLONIES/mL INSIGNIFICANT GROWTH Performed at [facility], [address], [place], [place]  [address]    Report Status 03/17/2017 FINAL  Final  Blood culture (routine x 2)     Status: Abnormal   Collection Time: 03/15/17 10:40 PM  Result Value Ref Range Status   Specimen Description BLOOD RIGHT ANTECUBITAL  Final   Special Requests   Final    BOTTLES DRAWN AEROBIC AND ANAEROBIC Blood Culture adequate volume   Culture  Setup Time   Final    GRAM NEGATIVE RODS AEROBIC BOTTLE ONLY CRITICAL RESULT CALLED TO, READ BACK BY AND VERIFIED WITH: Sarita Bottom AT 1353 03/17/17 L BENFIELD Performed at [facility], [place] [address], [place], [place] [address]    Culture ACHROMOBACTER DENITRIFICANS (A)  Final   Report Status 03/19/2017 FINAL  Final   Organism ID, Bacteria ACHROMOBACTER DENITRIFICANS  Final      Susceptibility   Achromobacter denitrificans - MIC*    CEFEPIME 16 INTERMEDIATE Intermediate     CEFAZOLIN >=64 RESISTANT Resistant     GENTAMICIN >=16 RESISTANT Resistant     CIPROFLOXACIN >=4 RESISTANT Resistant     IMIPENEM 0.5 SENSITIVE Sensitive     TRIMETH/SULFA <=20 SENSITIVE Sensitive     * ACHROMOBACTER DENITRIFICANS   Blood Culture ID Panel (Reflexed)     Status: None   Collection Time: 03/15/17 10:40 PM  Result Value Ref Range Status   Enterococcus species NOT DETECTED NOT DETECTED Final   Listeria monocytogenes NOT DETECTED NOT DETECTED Final   Staphylococcus species NOT DETECTED NOT DETECTED Final   Staphylococcus aureus NOT DETECTED NOT DETECTED Final   Streptococcus species NOT DETECTED NOT DETECTED Final   Streptococcus agalactiae NOT DETECTED NOT DETECTED Final   Streptococcus pneumoniae NOT DETECTED NOT DETECTED Final   Streptococcus pyogenes NOT DETECTED NOT DETECTED Final   Acinetobacter baumannii NOT DETECTED NOT DETECTED Final   Enterobacteriaceae species NOT DETECTED NOT DETECTED Final   Enterobacter cloacae complex NOT DETECTED NOT DETECTED Final   Escherichia coli NOT DETECTED NOT DETECTED Final   Klebsiella oxytoca NOT DETECTED NOT DETECTED Final   Klebsiella pneumoniae NOT DETECTED NOT DETECTED Final   Proteus species NOT DETECTED NOT DETECTED Final   Serratia marcescens NOT DETECTED NOT DETECTED Final   Haemophilus influenzae NOT DETECTED NOT DETECTED Final   Neisseria meningitidis NOT DETECTED NOT DETECTED Final   Pseudomonas aeruginosa NOT DETECTED NOT DETECTED Final   Candida albicans NOT DETECTED NOT DETECTED Final   Candida glabrata NOT DETECTED NOT DETECTED Final   Candida krusei NOT DETECTED NOT DETECTED Final   Candida parapsilosis NOT DETECTED NOT DETECTED Final   Candida tropicalis NOT DETECTED NOT DETECTED Final    Comment: Performed at [facility], [place] [address] [place], [place] [address]  Blood culture (routine x 2)     Status: None (Preliminary result)   Collection Time: 03/15/17 11:46 PM  Result Value Ref Range Status   Specimen Description BLOOD LEFT ANTECUBITAL  Final   Special Requests   Final    BOTTLES DRAWN AEROBIC AND ANAEROBIC Blood Culture adequate volume   Culture   Final    NO GROWTH 2 DAYS Performed at [facility], [place] [address], [place], [place] [address]    Report Status PENDING  Incomplete  Culture, blood (Routine x 2)     Status: None (Preliminary result)   Collection Time: 03/17/17  3:56 PM  Result Value Ref Range Status   Specimen Description BLOOD LEFT ANTECUBITAL  Final   Special Requests   Final  BOTTLES DRAWN AEROBIC AND ANAEROBIC Blood Culture adequate volume   Culture   Final    NO GROWTH < 24 HOURS Performed at [facility], [place] [address], [place], [place] [address]    Report Status PENDING  Incomplete  Culture, blood (Routine x 2)     Status: None (Preliminary result)   Collection Time: 03/17/17  5:50 PM  Result Value Ref Range Status   Specimen Description BLOOD RIGHT ANTECUBITAL  Final   Special Requests   Final    BOTTLES DRAWN AEROBIC AND ANAEROBIC Blood Culture adequate volume   Culture   Final    NO GROWTH < 24 HOURS Performed at [facility], [place] [address], [place], [place] [address]    Report Status PENDING  Incomplete  MRSA PCR Screening     Status: None   Collection Time: 03/18/17 10:42 PM  Result Value Ref Range Status   MRSA by PCR NEGATIVE NEGATIVE Final    Comment:        The GeneXpert MRSA Assay (FDA approved for NASAL specimens only), is one component of a comprehensive MRSA colonization surveillance program. It is not intended to diagnose MRSA infection nor to guide or monitor treatment for MRSA infections. Performed at [facility], [place] [address] [place], [place] [address]       Radiology Studies: No results found.    Medication:   . ALPRAZolam  0.5 mg Oral Once  . aspirin EC  81 mg Oral Daily  . [START ON 03/20/2017] atorvastatin  80 mg Oral QHS  . buPROPion  300 mg Oral q morning - 10a  . carvedilol  3.125 mg Oral BID WC  . cyclobenzaprine  10 mg Oral QHS  . FLUoxetine  30 mg Oral Daily  . fluticasone  2 spray Each Nare Daily  . insulin pump   Subcutaneous TID AC, HS, 0200  . levothyroxine  112 mcg Oral QAC breakfast  .  losartan  25 mg Oral Daily  . montelukast  10 mg Oral QHS    Continuous Infusions: . heparin 1,000 Units/hr (03/18/17 2200)  . meropenem (MERREM) IV       Signed, Ave Filter, PA-S [employer] Email: [email or web address] Phone: [phone]

## 2017-03-20 ENCOUNTER — Encounter (HOSPITAL_COMMUNITY): Payer: Self-pay | Admitting: Cardiovascular Disease

## 2017-03-20 ENCOUNTER — Inpatient Hospital Stay (HOSPITAL_COMMUNITY)
Admission: EM | Disposition: A | Discharge status: Home Health | Payer: Self-pay | Source: Home / Self Care | Attending: Internal Medicine

## 2017-03-20 DIAGNOSIS — R079 Chest pain, unspecified: Secondary | ICD-10-CM

## 2017-03-20 DIAGNOSIS — I2510 Atherosclerotic heart disease of native coronary artery without angina pectoris: Secondary | ICD-10-CM

## 2017-03-20 HISTORY — PX: CORONARY STENT INTERVENTION: CATH118234

## 2017-03-20 HISTORY — PX: LEFT HEART CATH AND CORONARY ANGIOGRAPHY: CATH118249

## 2017-03-20 LAB — BASIC METABOLIC PANEL
ANION GAP: 12 (ref 5–15)
ANION GAP: 13 (ref 5–15)
ANION GAP: 13 (ref 5–15)
BUN: 6 mg/dL (ref 6–20)
BUN: 10 mg/dL (ref 6–20)
BUN: 9 mg/dL (ref 6–20)
CALCIUM: 8.8 mg/dL — AB (ref 8.9–10.3)
CALCIUM: 9 mg/dL (ref 8.9–10.3)
CHLORIDE: 104 mmol/L (ref 101–111)
CHLORIDE: 101 mmol/L (ref 101–111)
CHLORIDE: 98 mmol/L — AB (ref 101–111)
CO2: 24 mmol/L (ref 22–32)
CO2: 21 mmol/L — AB (ref 22–32)
CO2: 25 mmol/L (ref 22–32)
CREATININE: 0.8 mg/dL (ref 0.44–1.00)
Calcium: 8.9 mg/dL (ref 8.9–10.3)
Creatinine, Ser: 0.97 mg/dL (ref 0.44–1.00)
Creatinine, Ser: 0.83 mg/dL (ref 0.44–1.00)
GFR calc Af Amer: >60 mL/min (ref >60)
GFR calc Af Amer: >60 mL/min (ref >60)
GFR calc non Af Amer: >60 mL/min (ref >60)
GFR calc non Af Amer: >60 mL/min (ref >60)
GFR calc non Af Amer: >60 mL/min (ref >60)
GLUCOSE: 358 mg/dL — AB (ref 65–99)
GLUCOSE: 205 mg/dL — AB (ref 65–99)
Glucose, Bld: 199 mg/dL — ABNORMAL HIGH (ref 65–99)
Potassium: 3.9 mmol/L (ref 3.5–5.1)
Potassium: 4.1 mmol/L (ref 3.5–5.1)
Potassium: 3.9 mmol/L (ref 3.5–5.1)
SODIUM: 140 mmol/L (ref 135–145)
Sodium: 135 mmol/L (ref 135–145)
Sodium: 136 mmol/L (ref 135–145)

## 2017-03-20 LAB — GLUCOSE, CAPILLARY
GLUCOSE-CAPILLARY: 226 mg/dL — AB (ref 65–99)
GLUCOSE-CAPILLARY: 164 mg/dL — AB (ref 65–99)
GLUCOSE-CAPILLARY: 241 mg/dL — AB (ref 65–99)
Glucose-Capillary: 361 mg/dL — ABNORMAL HIGH (ref 65–99)
Glucose-Capillary: 413 mg/dL — ABNORMAL HIGH (ref 65–99)

## 2017-03-20 LAB — POCT I-STAT, CHEM 8
BUN: 9 mg/dL (ref 6–20)
BUN: 9 mg/dL (ref 6–20)
CALCIUM ION: 1.03 mmol/L — AB (ref 1.15–1.40)
CALCIUM ION: 1.11 mmol/L — AB (ref 1.15–1.40)
CHLORIDE: 101 mmol/L (ref 101–111)
CHLORIDE: 97 mmol/L — AB (ref 101–111)
CREATININE: 0.5 mg/dL (ref 0.44–1.00)
Creatinine, Ser: 0.6 mg/dL (ref 0.44–1.00)
GLUCOSE: 206 mg/dL — AB (ref 65–99)
Glucose, Bld: 131 mg/dL — ABNORMAL HIGH (ref 65–99)
HCT: 27 % — ABNORMAL LOW (ref 36.0–46.0)
HCT: 29 % — ABNORMAL LOW (ref 36.0–46.0)
Hemoglobin: 9.2 g/dL — ABNORMAL LOW (ref 12.0–15.0)
Hemoglobin: 9.9 g/dL — ABNORMAL LOW (ref 12.0–15.0)
POTASSIUM: 3.8 mmol/L (ref 3.5–5.1)
Potassium: 3.1 mmol/L — ABNORMAL LOW (ref 3.5–5.1)
SODIUM: 136 mmol/L (ref 135–145)
Sodium: 141 mmol/L (ref 135–145)
TCO2: 25 mmol/L (ref 22–32)
TCO2: 25 mmol/L (ref 22–32)

## 2017-03-20 LAB — CBC
HEMATOCRIT: 32.1 % — AB (ref 36.0–46.0)
HEMATOCRIT: 34.3 % — AB (ref 36.0–46.0)
HEMOGLOBIN: 11.6 g/dL — AB (ref 12.0–15.0)
Hemoglobin: 10.8 g/dL — ABNORMAL LOW (ref 12.0–15.0)
MCH: 30.7 pg (ref 26.0–34.0)
MCH: 31 pg (ref 26.0–34.0)
MCHC: 33.6 g/dL (ref 30.0–36.0)
MCHC: 33.8 g/dL (ref 30.0–36.0)
MCV: 91.2 fL (ref 78.0–100.0)
MCV: 91.7 fL (ref 78.0–100.0)
PLATELETS: 355 10*3/uL (ref 150–400)
Platelets: 342 10*3/uL (ref 150–400)
RBC: 3.52 MIL/uL — ABNORMAL LOW (ref 3.87–5.11)
RBC: 3.74 MIL/uL — AB (ref 3.87–5.11)
RDW: 12.8 % (ref 11.5–15.5)
RDW: 12.8 % (ref 11.5–15.5)
WBC: 5.9 10*3/uL (ref 4.0–10.5)
WBC: 5.8 10*3/uL (ref 4.0–10.5)

## 2017-03-20 LAB — HEPARIN LEVEL (UNFRACTIONATED): HEPARIN UNFRACTIONATED: 0.36 [IU]/mL (ref 0.30–0.70)

## 2017-03-20 LAB — PROTIME-INR
INR: 1.06
Prothrombin Time: 13.7 seconds (ref 11.4–15.2)

## 2017-03-20 LAB — POCT ACTIVATED CLOTTING TIME
ACTIVATED CLOTTING TIME: 478 s
Activated Clotting Time: 428 seconds

## 2017-03-20 SURGERY — LEFT HEART CATH AND CORONARY ANGIOGRAPHY
Anesthesia: LOCAL

## 2017-03-20 MED ORDER — INSULIN ASPART 100 UNIT/ML ~~LOC~~ SOLN
10.0000 [IU] | Freq: Once | SUBCUTANEOUS | Status: AC
  Administered 2017-03-20: 19:00:00 10 [IU] via SUBCUTANEOUS

## 2017-03-20 MED ORDER — INSULIN ASPART 100 UNIT/ML ~~LOC~~ SOLN
1000.0000 [IU] | Freq: Once | SUBCUTANEOUS | Status: DC
  Filled 2017-03-20: qty 10

## 2017-03-20 MED ORDER — FENTANYL CITRATE (PF) 100 MCG/2ML IJ SOLN
INTRAMUSCULAR | Status: AC
  Filled 2017-03-20: qty 2

## 2017-03-20 MED ORDER — HEPARIN (PORCINE) IN NACL 2-0.9 UNIT/ML-% IJ SOLN: INTRAMUSCULAR | Status: DC | PRN

## 2017-03-20 MED ORDER — HEPARIN SODIUM (PORCINE) 1000 UNIT/ML IJ SOLN
INTRAMUSCULAR | Status: DC | PRN
  Administered 2017-03-20: 3500 [IU] via INTRAVENOUS
  Administered 2017-03-20: 6500 [IU] via INTRAVENOUS

## 2017-03-20 MED ORDER — TICAGRELOR 90 MG PO TABS
ORAL_TABLET | ORAL | Status: AC
  Filled 2017-03-20: qty 1

## 2017-03-20 MED ORDER — SODIUM CHLORIDE 0.9 % IV SOLN: INTRAVENOUS | Status: AC

## 2017-03-20 MED ORDER — IOPAMIDOL (ISOVUE-370) INJECTION 76%
INTRAVENOUS | Status: AC
  Filled 2017-03-20: qty 100

## 2017-03-20 MED ORDER — SODIUM CHLORIDE 0.9 % IV SOLN
250.0000 mL | INTRAVENOUS | Status: DC | PRN
  Administered 2017-03-21 (×2): 250 mL via INTRAVENOUS

## 2017-03-20 MED ORDER — SODIUM CHLORIDE 0.9% FLUSH: 3.0000 mL | Freq: Two times a day (BID) | INTRAVENOUS | Status: DC

## 2017-03-20 MED ORDER — VERAPAMIL HCL 2.5 MG/ML IV SOLN
INTRAVENOUS | Status: DC | PRN
  Administered 2017-03-20: 11:00:00 via INTRA_ARTERIAL

## 2017-03-20 MED ORDER — SODIUM CHLORIDE 0.9 % WEIGHT BASED INFUSION: 3.0000 mL/kg/h | INTRAVENOUS | Status: DC

## 2017-03-20 MED ORDER — TICAGRELOR 90 MG PO TABS
ORAL_TABLET | ORAL | Status: DC | PRN
  Administered 2017-03-20: 180 mg via ORAL

## 2017-03-20 MED ORDER — DEXTROSE 50 % IV SOLN
INTRAVENOUS | Status: AC
  Filled 2017-03-20: qty 50

## 2017-03-20 MED ORDER — SODIUM CHLORIDE 0.9 % IV SOLN: 250.0000 mL | INTRAVENOUS | Status: DC | PRN

## 2017-03-20 MED ORDER — LABETALOL HCL 5 MG/ML IV SOLN
10.0000 mg | INTRAVENOUS | Status: AC | PRN
Start: 2017-03-20 — End: 2017-03-20

## 2017-03-20 MED ORDER — HEPARIN (PORCINE) IN NACL 2-0.9 UNIT/ML-% IJ SOLN
INTRAMUSCULAR | Status: AC | PRN
  Administered 2017-03-20 (×2): 500 mL

## 2017-03-20 MED ORDER — SODIUM CHLORIDE 0.9 % WEIGHT BASED INFUSION: 1.0000 mL/kg/h | INTRAVENOUS | Status: DC

## 2017-03-20 MED ORDER — FENTANYL CITRATE (PF) 100 MCG/2ML IJ SOLN
INTRAMUSCULAR | Status: DC | PRN
  Administered 2017-03-20 (×3): 25 ug via INTRAVENOUS

## 2017-03-20 MED ORDER — ANGIOPLASTY BOOK
Freq: Once | Status: AC
  Administered 2017-03-21: 05:00:00
  Filled 2017-03-20: qty 1

## 2017-03-20 MED ORDER — HYDRALAZINE HCL 20 MG/ML IJ SOLN: 5.0000 mg | INTRAMUSCULAR | Status: AC | PRN

## 2017-03-20 MED ORDER — HEPARIN SODIUM (PORCINE) 1000 UNIT/ML IJ SOLN
INTRAMUSCULAR | Status: AC
  Filled 2017-03-20: qty 1

## 2017-03-20 MED ORDER — INSULIN ASPART 100 UNIT/ML ~~LOC~~ SOLN: 1000.0000 [IU] | Freq: Once | SUBCUTANEOUS | Status: DC

## 2017-03-20 MED ORDER — LIDOCAINE HCL 1 % IJ SOLN
INTRAMUSCULAR | Status: AC
  Filled 2017-03-20: qty 20

## 2017-03-20 MED ORDER — HEPARIN (PORCINE) IN NACL 2-0.9 UNIT/ML-% IJ SOLN
INTRAMUSCULAR | Status: AC
  Filled 2017-03-20: qty 1000

## 2017-03-20 MED ORDER — ASPIRIN 81 MG PO CHEW: 81.0000 mg | CHEWABLE_TABLET | ORAL | Status: DC

## 2017-03-20 MED ORDER — MORPHINE SULFATE (PF) 4 MG/ML IV SOLN: 2.0000 mg | INTRAVENOUS | Status: DC | PRN

## 2017-03-20 MED ORDER — TICAGRELOR 90 MG PO TABS
90.0000 mg | ORAL_TABLET | Freq: Two times a day (BID) | ORAL | Status: DC
  Administered 2017-03-20 – 2017-03-22 (×4): 90 mg via ORAL
  Filled 2017-03-20 (×4): qty 1

## 2017-03-20 MED ORDER — LIDOCAINE HCL (PF) 1 % IJ SOLN
INTRAMUSCULAR | Status: DC | PRN
  Administered 2017-03-20: 2 mL

## 2017-03-20 MED ORDER — SODIUM CHLORIDE 0.9% FLUSH
3.0000 mL | Freq: Two times a day (BID) | INTRAVENOUS | Status: DC
  Administered 2017-03-21 (×2): 3 mL via INTRAVENOUS

## 2017-03-20 MED ORDER — SODIUM CHLORIDE 0.9% FLUSH: 3.0000 mL | INTRAVENOUS | Status: DC | PRN

## 2017-03-20 MED ORDER — MIDAZOLAM HCL 2 MG/2ML IJ SOLN
INTRAMUSCULAR | Status: DC | PRN
  Administered 2017-03-20 (×2): 1 mg via INTRAVENOUS

## 2017-03-20 MED ORDER — IOPAMIDOL (ISOVUE-370) INJECTION 76%
INTRAVENOUS | Status: DC | PRN
  Administered 2017-03-20: 220 mL via INTRA_ARTERIAL

## 2017-03-20 MED ORDER — INSULIN PUMP: Freq: Three times a day (TID) | SUBCUTANEOUS | Status: DC

## 2017-03-20 MED ORDER — HEART ATTACK BOUNCING BOOK
Freq: Once | Status: AC
  Administered 2017-03-21: 05:00:00
  Filled 2017-03-20: qty 1

## 2017-03-20 MED ORDER — VERAPAMIL HCL 2.5 MG/ML IV SOLN
INTRAVENOUS | Status: AC
  Filled 2017-03-20: qty 2

## 2017-03-20 MED ORDER — DEXTROSE 50 % IV SOLN
INTRAVENOUS | Status: DC | PRN
  Administered 2017-03-20: 25 mL
  Administered 2017-03-20: 25 mL via INTRAVENOUS

## 2017-03-20 MED ORDER — IOPAMIDOL (ISOVUE-370) INJECTION 76%
INTRAVENOUS | Status: AC
  Filled 2017-03-20: qty 50

## 2017-03-20 MED ORDER — MIDAZOLAM HCL 2 MG/2ML IJ SOLN
INTRAMUSCULAR | Status: AC
  Filled 2017-03-20: qty 2

## 2017-03-20 SURGICAL SUPPLY — 27 items
BALLN EMERGE MR 2.0X20 (BALLOONS) ×2
BALLN EMERGE MR 2.5X12 (BALLOONS) ×2
BALLN SAPPHIRE 2.0X12 (BALLOONS) ×2
BALLN SAPPHIRE ~~LOC~~ 2.5X15 (BALLOONS) ×1 IMPLANT
BALLN SAPPHIRE ~~LOC~~ 2.75X15 (BALLOONS) ×1 IMPLANT
BALLN ~~LOC~~ EMERGE MR 3.25X8 (BALLOONS) ×2
BALLOON EMERGE MR 2.0X20 (BALLOONS) IMPLANT
BALLOON EMERGE MR 2.5X12 (BALLOONS) IMPLANT
BALLOON SAPPHIRE 2.0X12 (BALLOONS) IMPLANT
BALLOON ~~LOC~~ EMERGE MR 3.25X8 (BALLOONS) IMPLANT
CATH 5FR JL3.5 JR4 ANG PIG MP (CATHETERS) ×1 IMPLANT
CATH INFINITI 5 FR 3DRC (CATHETERS) ×1 IMPLANT
CATH VISTA GUIDE 6FR XBLAD3.5 (CATHETERS) ×1 IMPLANT
DEVICE RAD COMP TR BAND LRG (VASCULAR PRODUCTS) ×1 IMPLANT
GLIDESHEATH SLEND SS 6F .021 (SHEATH) ×1 IMPLANT
GUIDEWIRE INQWIRE 1.5J.035X260 (WIRE) IMPLANT
INQWIRE 1.5J .035X260CM (WIRE) ×2
KIT ENCORE 26 ADVANTAGE (KITS) ×1 IMPLANT
KIT HEART LEFT (KITS) ×2 IMPLANT
PACK CARDIAC CATHETERIZATION (CUSTOM PROCEDURE TRAY) ×2 IMPLANT
STENT SYNERGY DES 2.25X38 (Permanent Stent) ×1 IMPLANT
STENT SYNERGY DES 2.5X16 (Permanent Stent) ×1 IMPLANT
STENT SYNERGY DES 2.75X16 (Permanent Stent) ×1 IMPLANT
STENT SYNERGY DES 3X12 (Permanent Stent) ×1 IMPLANT
TRANSDUCER W/STOPCOCK (MISCELLANEOUS) ×2 IMPLANT
TUBING CIL FLEX 10 FLL-RA (TUBING) ×2 IMPLANT
WIRE COUGAR XT STRL 190CM (WIRE) ×2 IMPLANT

## 2017-03-20 NOTE — Progress Notes (Signed)
Patient transferred from cath lab via bed by cath lab staff. Insulin pump infusing at basal rate. Patient stated she only had 3 units left and needed Novolog insulin to refill reservoir. Orders for insulin pump checked and Patient contract for insulin pump therapy signed. Requested order for vial of Novolog insulin for refill, ordered by Dr. Cruzita Lederer and RN assisted patient in refilling pump using Novolog insulin. 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 Blood sugar was 164 at 1336. Dr. Cruzita Lederer aware.

## 2017-03-20 NOTE — Progress Notes (Signed)
83 Notified Dr. Cruzita Lederer of problem with insulin pump, 10 units of Novolog sq given at 1730 and plan of care to obtain insulin pump supplies and continue using insulin pump after supplies received. Dr. Cruzita Lederer in agreement with plan and patient monitoring and controlling blood sugar, continuing use of insulin pump when supplies received. Supplies received for insulin pump. Patient changed pump set, secure and intact site in abdomen, taped for security. Blood sugars continued to decrease. Sonia Columbres RN given report at bedside. Patient will continue to monitor and maintain insulin pump.

## 2017-03-20 NOTE — Progress Notes (Addendum)
Progress Note  Patient Name: Jennifer Hogan Date of Encounter: 03/20/2017  Primary Cardiologist: No primary care provider on file.   Subjective   No further CP overnight.  Afebrile for 24 hours.  Inpatient Medications    Scheduled Meds: . ALPRAZolam  0.5 mg Oral Once  . aspirin EC  81 mg Oral Daily  . atorvastatin  80 mg Oral QHS  . buPROPion  300 mg Oral q morning - 10a  . carvedilol  3.125 mg Oral BID WC  . cyclobenzaprine  10 mg Oral QHS  . FLUoxetine  30 mg Oral Daily  . fluticasone  2 spray Each Nare Daily  . insulin aspart  0-15 Units Subcutaneous TID WC  . insulin aspart  0-5 Units Subcutaneous QHS  . insulin pump   Subcutaneous TID AC, HS, 0200  . levothyroxine  112 mcg Oral QAC breakfast  . losartan  25 mg Oral Daily  . montelukast  10 mg Oral QHS   Continuous Infusions: . heparin 1,000 Units/hr (03/18/17 2200)  . meropenem (MERREM) IV Stopped (03/20/17 0420)   PRN Meds: acetaminophen **OR** acetaminophen, albuterol, meloxicam, morphine injection, nitroGLYCERIN, ondansetron (ZOFRAN) IV, polyvinyl alcohol   Vital Signs    Vitals:   03/19/17 1916 03/19/17 2327 03/20/17 0301 03/20/17 0700  BP: 120/70 132/85 127/70 138/74  Pulse: 91 71 66 92  Resp: 20 (!) 28 20 16   Temp: 98.9 F (37.2 C) 97.8 F (36.6 C) 98.7 F (37.1 C) 98.7 F (37.1 C)  TempSrc: Oral Oral Oral Oral  SpO2: 97% 98% 95% 100%  Weight:   159 lb 2.8 oz (72.2 kg)   Height:        Intake/Output Summary (Last 24 hours) at 03/20/2017 3419 Last data filed at 03/20/2017 0600 Gross per 24 hour  Intake 658.83 ml  Output 1800 ml  Net -1141.17 ml   Filed Weights   03/18/17 2206 03/19/17 0349 03/20/17 0301  Weight: 161 lb 9.6 oz (73.3 kg) 160 lb 15 oz (73 kg) 159 lb 2.8 oz (72.2 kg)    Telemetry    NSR - Personally Reviewed  ECG    No new EKG to review - Personally Reviewed  Physical Exam   GEN: No acute distress.   Neck: No JVD Cardiac: RRR, no murmurs, rubs, or gallops.    Respiratory: Clear to auscultation bilaterally. GI: Soft, nontender, non-distended  MS: No edema; No deformity. Neuro:  Nonfocal  Psych: Normal affect   Labs    Chemistry Recent Labs  Lab 03/15/17 1313  03/17/17 1556 03/18/17 0403 03/19/17 0522 03/20/17 0214  NA 134*   < > 138 141 140 140  K 4.5   < > 3.7 3.3* 3.2* 3.9  CL 102   < > 98* 106 105 104  CO2 15*   < > 21* 24 26 24   GLUCOSE 381*   < > 288* 152* 186* 199*  BUN 22*   < > 7 6 <5* 6  CREATININE 1.51*   < > 0.83 0.81 0.71 0.80  CALCIUM 9.8   < > 8.2* 8.4* 8.2* 8.9  PROT 8.4*  --  6.6 5.6*  --   --   ALBUMIN 4.7  --  3.5 3.0*  --   --   AST 31  --  50* 28  --   --   ALT 26  --  22 20  --   --   ALKPHOS 77  --  61 51  --   --  BILITOT 1.2  --  0.8 0.7  --   --   GFRNONAA 40*   < > >60 >60 >60 >60  GFRAA 46*   < > >60 >60 >60 >60  ANIONGAP 17*   < > 19* 11 9 12    < > = values in this interval not displayed.     Hematology Recent Labs  Lab 03/18/17 0403 03/19/17 0522 03/20/17 0214  WBC 6.0 6.4 5.9  RBC 3.52* 3.41* 3.52*  HGB 10.6* 10.3* 10.8*  HCT 32.2* 31.2* 32.1*  MCV 91.5 91.5 91.2  MCH 30.1 30.2 30.7  MCHC 32.9 33.0 33.6  RDW 12.9 13.0 12.8  PLT 357 343 355    Cardiac Enzymes Recent Labs  Lab 03/18/17 0823 03/18/17 1628 03/18/17 2221 03/19/17 0522  TROPONINI 1.24* 1.22* 1.19* 0.91*   No results for input(s): TROPIPOC in the last 168 hours.   BNPNo results for input(s): BNP, PROBNP in the last 168 hours.   DDimer No results for input(s): DDIMER in the last 168 hours.   Radiology    Ct Coronary Morph W/cta Cor W/score W/ca W/cm &/or Wo/cm  Addendum Date: 03/19/2017   ADDENDUM REPORT: 03/19/2017 17:42 CLINICAL DATA:  47 year old female with atypical chest pain, bacteremia, elevated troponin and an echocardiogram suspicious for a stress induced cardiomyopathy. EXAM: Cardiac/Coronary  CT TECHNIQUE: The patient was scanned on a [employer]. FINDINGS: A 120 kV prospective scan was  triggered in the descending thoracic aorta at 111 HU's. Axial non-contrast 3 mm slices were carried out through the heart. The data set was analyzed on a dedicated work station and scored using the [place]. Gantry rotation speed was 250 msecs and collimation was .6 mm. No beta blockade and 0.8 mg of sl NTG was given. The 3D data set was reconstructed in 5% intervals of the 67-82 % of the R-R cycle. Diastolic phases were analyzed on a dedicated work station using MPR, MIP and VRT modes. The patient received 80 cc of contrast. Aorta:  Normal size.  No calcifications.  No dissection. Aortic Valve:  Trileaflet.  No calcifications. Coronary Arteries:  Normal coronary origin.  Right dominance. RCA is a large dominant artery that gives rise to PDA and a small PLA. There is no plaque. Left main is a large artery that gives rise to LAD and LCX arteries. There is no plaque. LAD is a large vessel that gives rise to one large diagonal artery. Proximal LAD is affected by motion however there appears to be a moderate non-calcified plaque with associated with 50-69% stenosis but possibly > 70 stenosis. Mid and distal LAD have another mild noncalcified plaques with associated stenoses 25-50%. D1 is fairly large and has no significant plaque. LCX is a small non-dominant artery. Other findings: Normal pulmonary vein drainage into the left atrium. Normal let atrial appendage without a thrombus. Normal size of the pulmonary artery. IMPRESSION: 1. Coronary calcium score of 0. This was 0 percentile for age and sex matched control. 2. Normal coronary origin with right dominance. 3. Proximal LAD is affected by motion however there appears to be at least moderate non-calcified plaque. A cardiac catheterization is recommended. Electronically Signed   By: [name]   On: 03/19/2017 17:42   Result Date: 03/19/2017 EXAM: OVER-READ INTERPRETATION  CT CHEST The following report is an over-read performed by radiologist Dr. Aletta Edouard of [facility], [place] on 03/19/2017. This over-read does not include interpretation of cardiac or coronary anatomy or pathology. The coronary  calcium score/coronary CTA interpretation by the cardiologist is attached. COMPARISON:  None. FINDINGS: Vascular: Central pulmonary arteries are normal in caliber. No evidence of aneurysmal disease of the visualized thoracic aorta or proximal abdominal aorta. Mediastinum/Nodes: No enlarged lymph nodes identified. No evidence of hiatal hernia. Lungs/Pleura: Visualized lungs show some mild scarring/atelectasis at both lung bases. No edema, nodule or pleural fluid identified. Upper Abdomen: No acute abnormality. Musculoskeletal: No chest wall mass or suspicious bone lesions identified. IMPRESSION: No significant noncardiac findings in the visualized portion of the chest. Electronically Signed: By: Aletta Edouard M.D. On: 03/19/2017 15:24    Cardiac Studies   Echo 03/16/17 Study Conclusions  - Left ventricle: The cavity size was normal. Systolic function was mildly reduced. The estimated ejection fraction was in the range of 45% to 50%. Hypokinesis of the apical myocardium. Features are consistent with a pseudonormal left ventricular filling pattern, with concomitant abnormal relaxation and increased filling pressure (grade 2 diastolic dysfunction). - Pulmonary arteries: Systolic pressure was mildly increased. PA peak pressure: 37 mm Hg (S).  Impressions: - Appearnce suggests &quot;apical ballooning&quot;. This could representischemia due to stenosis in a &quot;wrap-around&quot; LAD artery or stress cardiomyopathy (takotsubo syndrome).  Coronary CTA 02/2017 IMPRESSION: 1. Coronary calcium score of 0. This was 0 percentile for age and sex matched control.  2. Normal coronary origin with right dominance.  3. Proximal LAD is affected by motion however there appears to be at least moderate non-calcified plaque. A cardiac  catheterization is recommended.   Patient Profile     47 y.o. female with a hx of type I diabetes, hyperlipidemia, hypothyroidism and family history of CADwho is being seen today for the evaluation of abnormal echocardiogram and EKGat the request of [place].  Assessment & Plan    1. 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 -Coronary CTA with a calcium score of 0 but moderate noncalcified plaque in the prox LAD possibly > 70% - cath recommended -continue ASA, high dose statin, BB and IV Heparin gtt -discussed with TRH about bacteremia and it was only in 1 blood cx and she has been afebrile for 24 hours.  They feel safe to proceed with cath. -Cardiac catheterization was discussed with the patient fully. The patient understands that risks include but are not limited to stroke (1 in 1000), death (1 in 95), kidney failure [usually temporary] (1 in 500), bleeding (1 in 200), allergic reaction [possibly serious] (1 in 200).  The patient understands and is willing to proceed.    2. Bacteremia: -Empiric IV Abx given + BC's gram negative rods -Afebrile -WBC 5.9  3. Chronic systolic heart failure: -Echo 03/16/17 with decreased Ef to 45-50% with WMA, hypokinesis -No s/s of fluid volume overload -continue carvedilol 3.125mg  BID and Losartan 25mg  daily  4. Type I DM: -Per primary, insulin pump  5. Anemia: -CBC, Hb, 10.8 today   6. HLD: -Lipid panel, CHO-197, HDL-56, LDL-126, Trig-74 -changed to high dose statin  7. Hypokalemia: -repleted today at 3.9  I have spent a total of 35 minutes with patient reviewing coronary CTA , telemetry, EKGs, labs, discussing risks and benefits of cardiac cath and examining patient as well as establishing an  assessment and plan that was discussed with the patient.  > 50% of time was spent in direct patient care.  For questions or updates, please contact [place] Please consult [email or web address] for contact info under Cardiology/STEMI.      Signed, Fransico Him, MD  03/20/2017, 8:22 AM

## 2017-03-20 NOTE — H&P (View-Only) (Signed)
Progress Note  Patient Name: Jennifer Hogan Date of Encounter: 03/20/2017  Primary Cardiologist: No primary care provider on file.   Subjective   No further CP overnight.  Afebrile for 24 hours.  Inpatient Medications    Scheduled Meds: . ALPRAZolam  0.5 mg Oral Once  . aspirin EC  81 mg Oral Daily  . atorvastatin  80 mg Oral QHS  . buPROPion  300 mg Oral q morning - 10a  . carvedilol  3.125 mg Oral BID WC  . cyclobenzaprine  10 mg Oral QHS  . FLUoxetine  30 mg Oral Daily  . fluticasone  2 spray Each Nare Daily  . insulin aspart  0-15 Units Subcutaneous TID WC  . insulin aspart  0-5 Units Subcutaneous QHS  . insulin pump   Subcutaneous TID AC, HS, 0200  . levothyroxine  112 mcg Oral QAC breakfast  . losartan  25 mg Oral Daily  . montelukast  10 mg Oral QHS   Continuous Infusions: . heparin 1,000 Units/hr (03/18/17 2200)  . meropenem (MERREM) IV Stopped (03/20/17 0420)   PRN Meds: acetaminophen **OR** acetaminophen, albuterol, meloxicam, morphine injection, nitroGLYCERIN, ondansetron (ZOFRAN) IV, polyvinyl alcohol   Vital Signs    Vitals:   03/19/17 1916 03/19/17 2327 03/20/17 0301 03/20/17 0700  BP: 120/70 132/85 127/70 138/74  Pulse: 91 71 66 92  Resp: 20 (!) 28 20 16   Temp: 98.9 F (37.2 C) 97.8 F (36.6 C) 98.7 F (37.1 C) 98.7 F (37.1 C)  TempSrc: Oral Oral Oral Oral  SpO2: 97% 98% 95% 100%  Weight:   159 lb 2.8 oz (72.2 kg)   Height:        Intake/Output Summary (Last 24 hours) at 03/20/2017 7654 Last data filed at 03/20/2017 0600 Gross per 24 hour  Intake 658.83 ml  Output 1800 ml  Net -1141.17 ml   Filed Weights   03/18/17 2206 03/19/17 0349 03/20/17 0301  Weight: 161 lb 9.6 oz (73.3 kg) 160 lb 15 oz (73 kg) 159 lb 2.8 oz (72.2 kg)    Telemetry    NSR - Personally Reviewed  ECG    No new EKG to review - Personally Reviewed  Physical Exam   GEN: No acute distress.   Neck: No JVD Cardiac: RRR, no murmurs, rubs, or gallops.    Respiratory: Clear to auscultation bilaterally. GI: Soft, nontender, non-distended  MS: No edema; No deformity. Neuro:  Nonfocal  Psych: Normal affect   Labs    Chemistry Recent Labs  Lab 03/15/17 1313  03/17/17 1556 03/18/17 0403 03/19/17 0522 03/20/17 0214  NA 134*   < > 138 141 140 140  K 4.5   < > 3.7 3.3* 3.2* 3.9  CL 102   < > 98* 106 105 104  CO2 15*   < > 21* 24 26 24   GLUCOSE 381*   < > 288* 152* 186* 199*  BUN 22*   < > 7 6 <5* 6  CREATININE 1.51*   < > 0.83 0.81 0.71 0.80  CALCIUM 9.8   < > 8.2* 8.4* 8.2* 8.9  PROT 8.4*  --  6.6 5.6*  --   --   ALBUMIN 4.7  --  3.5 3.0*  --   --   AST 31  --  50* 28  --   --   ALT 26  --  22 20  --   --   ALKPHOS 77  --  61 51  --   --  BILITOT 1.2  --  0.8 0.7  --   --   GFRNONAA 40*   < > >60 >60 >60 >60  GFRAA 46*   < > >60 >60 >60 >60  ANIONGAP 17*   < > 19* 11 9 12    < > = values in this interval not displayed.     Hematology Recent Labs  Lab 03/18/17 0403 03/19/17 0522 03/20/17 0214  WBC 6.0 6.4 5.9  RBC 3.52* 3.41* 3.52*  HGB 10.6* 10.3* 10.8*  HCT 32.2* 31.2* 32.1*  MCV 91.5 91.5 91.2  MCH 30.1 30.2 30.7  MCHC 32.9 33.0 33.6  RDW 12.9 13.0 12.8  PLT 357 343 355    Cardiac Enzymes Recent Labs  Lab 03/18/17 0823 03/18/17 1628 03/18/17 2221 03/19/17 0522  TROPONINI 1.24* 1.22* 1.19* 0.91*   No results for input(s): TROPIPOC in the last 168 hours.   BNPNo results for input(s): BNP, PROBNP in the last 168 hours.   DDimer No results for input(s): DDIMER in the last 168 hours.   Radiology    Ct Coronary Morph W/cta Cor W/score W/ca W/cm &/or Wo/cm  Addendum Date: 03/19/2017   ADDENDUM REPORT: 03/19/2017 17:42 CLINICAL DATA:  47 year old female with atypical chest pain, bacteremia, elevated troponin and an echocardiogram suspicious for a stress induced cardiomyopathy. EXAM: Cardiac/Coronary  CT TECHNIQUE: The patient was scanned on a [employer]. FINDINGS: A 120 kV prospective scan was  triggered in the descending thoracic aorta at 111 HU's. Axial non-contrast 3 mm slices were carried out through the heart. The data set was analyzed on a dedicated work station and scored using the [place]. Gantry rotation speed was 250 msecs and collimation was .6 mm. No beta blockade and 0.8 mg of sl NTG was given. The 3D data set was reconstructed in 5% intervals of the 67-82 % of the R-R cycle. Diastolic phases were analyzed on a dedicated work station using MPR, MIP and VRT modes. The patient received 80 cc of contrast. Aorta:  Normal size.  No calcifications.  No dissection. Aortic Valve:  Trileaflet.  No calcifications. Coronary Arteries:  Normal coronary origin.  Right dominance. RCA is a large dominant artery that gives rise to PDA and a small PLA. There is no plaque. Left main is a large artery that gives rise to LAD and LCX arteries. There is no plaque. LAD is a large vessel that gives rise to one large diagonal artery. Proximal LAD is affected by motion however there appears to be a moderate non-calcified plaque with associated with 50-69% stenosis but possibly > 70 stenosis. Mid and distal LAD have another mild noncalcified plaques with associated stenoses 25-50%. D1 is fairly large and has no significant plaque. LCX is a small non-dominant artery. Other findings: Normal pulmonary vein drainage into the left atrium. Normal let atrial appendage without a thrombus. Normal size of the pulmonary artery. IMPRESSION: 1. Coronary calcium score of 0. This was 0 percentile for age and sex matched control. 2. Normal coronary origin with right dominance. 3. Proximal LAD is affected by motion however there appears to be at least moderate non-calcified plaque. A cardiac catheterization is recommended. Electronically Signed   By: [name]   On: 03/19/2017 17:42   Result Date: 03/19/2017 EXAM: OVER-READ INTERPRETATION  CT CHEST The following report is an over-read performed by radiologist Dr. Aletta Edouard of [facility], [place] on 03/19/2017. This over-read does not include interpretation of cardiac or coronary anatomy or pathology. The coronary  calcium score/coronary CTA interpretation by the cardiologist is attached. COMPARISON:  None. FINDINGS: Vascular: Central pulmonary arteries are normal in caliber. No evidence of aneurysmal disease of the visualized thoracic aorta or proximal abdominal aorta. Mediastinum/Nodes: No enlarged lymph nodes identified. No evidence of hiatal hernia. Lungs/Pleura: Visualized lungs show some mild scarring/atelectasis at both lung bases. No edema, nodule or pleural fluid identified. Upper Abdomen: No acute abnormality. Musculoskeletal: No chest wall mass or suspicious bone lesions identified. IMPRESSION: No significant noncardiac findings in the visualized portion of the chest. Electronically Signed: By: Aletta Edouard M.D. On: 03/19/2017 15:24    Cardiac Studies   Echo 03/16/17 Study Conclusions  - Left ventricle: The cavity size was normal. Systolic function was mildly reduced. The estimated ejection fraction was in the range of 45% to 50%. Hypokinesis of the apical myocardium. Features are consistent with a pseudonormal left ventricular filling pattern, with concomitant abnormal relaxation and increased filling pressure (grade 2 diastolic dysfunction). - Pulmonary arteries: Systolic pressure was mildly increased. PA peak pressure: 37 mm Hg (S).  Impressions: - Appearnce suggests &quot;apical ballooning&quot;. This could representischemia due to stenosis in a &quot;wrap-around&quot; LAD artery or stress cardiomyopathy (takotsubo syndrome).  Coronary CTA 02/2017 IMPRESSION: 1. Coronary calcium score of 0. This was 0 percentile for age and sex matched control.  2. Normal coronary origin with right dominance.  3. Proximal LAD is affected by motion however there appears to be at least moderate non-calcified plaque. A cardiac  catheterization is recommended.   Patient Profile     47 y.o. female with a hx of type I diabetes, hyperlipidemia, hypothyroidism and family history of CADwho is being seen today for the evaluation of abnormal echocardiogram and EKGat the request of [place].  Assessment & Plan    1. 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 -Coronary CTA with a calcium score of 0 but moderate noncalcified plaque in the prox LAD possibly > 70% - cath recommended -continue ASA, high dose statin, BB and IV Heparin gtt -discussed with TRH about bacteremia and it was only in 1 blood cx and she has been afebrile for 24 hours.  They feel safe to proceed with cath. -Cardiac catheterization was discussed with the patient fully. The patient understands that risks include but are not limited to stroke (1 in 1000), death (1 in 3), kidney failure [usually temporary] (1 in 500), bleeding (1 in 200), allergic reaction [possibly serious] (1 in 200).  The patient understands and is willing to proceed.    2. Bacteremia: -Empiric IV Abx given + BC's gram negative rods -Afebrile -WBC 5.9  3. Chronic systolic heart failure: -Echo 03/16/17 with decreased Ef to 45-50% with WMA, hypokinesis -No s/s of fluid volume overload -continue carvedilol 3.125mg  BID and Losartan 25mg  daily  4. Type I DM: -Per primary, insulin pump  5. Anemia: -CBC, Hb, 10.8 today   6. HLD: -Lipid panel, CHO-197, HDL-56, LDL-126, Trig-74 -changed to high dose statin  7. Hypokalemia: -repleted today at 3.9  I have spent a total of 35 minutes with patient reviewing coronary CTA , telemetry, EKGs, labs, discussing risks and benefits of cardiac cath and examining patient as well as establishing an  assessment and plan that was discussed with the patient.  > 50% of time was spent in direct patient care.  For questions or updates, please contact [place] Please consult [email or web address] for contact info under Cardiology/STEMI.      Signed, Fransico Him, MD  03/20/2017, 8:22 AM

## 2017-03-20 NOTE — Interval H&P Note (Signed)
History and Physical Interval Note:  03/20/2017 10:45 AM  Jennifer Hogan  has presented today for cardiac cath with the diagnosis of unstable angina. The various methods of treatment have been discussed with the patient and family. After consideration of risks, benefits and other options for treatment, the patient has consented to  Procedure(s): LEFT HEART CATH AND CORONARY ANGIOGRAPHY (N/A) as a surgical intervention .  The patient's history has been reviewed, patient examined, no change in status, stable for surgery.  I have reviewed the patient's chart and labs.  Questions were answered to the patient's satisfaction.    Cath Lab Visit (complete for each Cath Lab visit)  Clinical Evaluation Leading to the Procedure:   ACS: Yes.    Non-ACS:    Anginal Classification: CCS III  Anti-ischemic medical therapy: No Therapy  Non-Invasive Test Results: No non-invasive testing performed  Prior CABG: No previous CABG        Lauree Chandler

## 2017-03-20 NOTE — Progress Notes (Signed)
1720 blood sugar 413, Patient stated she did not feel that the needle was inserted correctly and that the insulin was infusing outside the subcutaneous area. Attempted to call diabetic coordinator, pharmacy, care management and supplies with no success. Patient had visitors at 1730. Patient took 10 units of Novolog sq and continued to monitor her blood sugar. Blood sugars continue to drop, 449, 433, 391, and 330 at 1821. Patients visitor is going to get her insulin pump supplies at her home and bring them in. She is monitoring the blood sugar to make sure it is decreasing until she is able to get supplies to restart the insulin pump. Patient is alert oriented and sharing stories with visitor. Frequently checked and no change in assessment.

## 2017-03-20 NOTE — Progress Notes (Signed)
Inpatient Diabetes Program Recommendations  AACE/ADA: New Consensus Statement on Inpatient Glycemic Control (2015)  Target Ranges:  Prepandial:   less than 140 mg/dL      Peak postprandial:   less than 180 mg/dL (1-2 hours)      Critically ill patients:  140 - 180 mg/dL   Lab Results  Component Value Date   GLUCAP 361 (H) 03/20/2017   HGBA1C 8.0 11/23/2015    Review of Glycemic Control Results for Jennifer Hogan, Jennifer Hogan (MRN [id number]) as of 03/20/2017 10:52  Ref. Range 03/19/2017 17:05 03/19/2017 21:05 03/19/2017 23:33 03/20/2017 03:03 03/20/2017 08:07  Glucose-Capillary Latest Ref Range: 65 - 99 mg/dL 135 (H) 196 (H) 271 (H) 226 (H) 361 (H)   Diabetes history: DM1 Outpatient Diabetes medications: Insulin pump Current orders for Inpatient glycemic control: Insulin pump resumed  Inpatient Diabetes Program Recommendations:    Spoke with patient @ bedside. Patient replaced her insulin insertion site and pump in sustain mode until post heart catheterization. Spoke with Dr. Cruzita Lederer and updated on insulin pump status and received orders to D/C Novolog correction scale. Called cath lab and spoke to [place] to request a CBG check within the hr due to given 15 units Novolog per correction scale this am. Per  endocrinologist Dr. Cordelia Pen note in 2017: Basal rates: 0.6 units/hr. per 24 hours, 1 U/15 gms CHO, 1 U for 50 if glucose exceeds 100.    Will follow during hospitalization.  Thank you, Nani Gasser. Sanjeev Main, RN, MSN, CDE  Diabetes Coordinator Inpatient Glycemic Control Team Team Pager [phone] (8am-5pm) 03/20/2017 11:24 AM

## 2017-03-20 NOTE — Progress Notes (Signed)
PROGRESS NOTE  Jennifer Hogan DJM:[id number] DOB: [birth year] DOA: 03/17/2017 PCP: Hoyt Koch, MD   LOS: 2 days   Brief Narrative / Interim history: 47 year old female with type 1 diabetes mellitus on chronic insulin pump, hypothyroidism, was recently admitted to the hospital in the setting of a GI illness with nausea vomiting diarrhea, felt to be dehydrated, received IV fluids and was discharged home.  During that admission underwent blood cultures that eventually speciated gram-negative rods, patient was called to come back.  She also underwent an echo which showed an depressed EF of 45-50%.  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  Antibiogram shows sensitive to carbapenems as well as Bactrim -Discussed with infectious disease over the phone, recommend a carbapenem for now while in house and potentially can be transitioned to Bactrim.  Recommends a 5-7-day course total. -Surveillance cultures have remained negative, she is afebrile and asymptomatic currently.  Coronary artery disease with acute systolic CHF -Unclear if acute or chronic, patient has been having intermittent chest discomfort as well as some shortness of breath with exertion for the past several months -2D echo done on 2/22 showed an EF of 45-50% with hypokinesis of the apical myocardium -Patient had an episode of chest pain requiring stepdown transfer on 2/24.  Cardiology took patient to the Cath Lab 2/26,  and she was found to have severe double vessel coronary artery disease status post drug-eluting stent x4, 3 in the LAD and one in the intermediate branch, with recommendation for dual antiplatelet therapy with aspirin and Brilinta for 1 year, statin and beta-blocker.  Troponin elevation due to NSTEMI -Status post stent placement x4 on 2/26  Type 1 diabetes mellitus -Continue insulin pump, patient was off of her insulin pump overnight and her CBG this morning is in the 300.  She is worried that she will go into DKA, discussed with diabetes coordinator and was able to provide patient with pump supplies.  She received NovoLog 15 units x1.  Will recheck CBG once she comes back from the Cath Lab and determine whether we can do Lantus or insulin drip or back on her insulin pump  Depression -Continue home medications, Wellbutrin, Prozac  Hypothyroidism -Continue Synthroid   DVT prophylaxis: heparin Code Status: Full code Family Communication: no family at bedside Disposition Plan: home likely 24h   Consultants:   Cardiology   Procedures:   None   Antimicrobials:  Zosyn 2/23 >> 2/25  Meropenem 2/25 >>   Subjective: -Denies any chest pain this morning, no abdominal pain, she has mild nausea as she feels when she is about to go into DKA but no vomiting.  No fevers or chills.  Objective: Vitals:   03/20/17 1241 03/20/17 1246 03/20/17 1251 03/20/17 1256  BP: 123/74 125/76 123/77 118/71  Pulse: 72 72 72 67  Resp: 12 20 13  (!) 4  Temp:      TempSrc:      SpO2: 100% 100% 100% (!) 0%  Weight:  Height:        Intake/Output Summary (Last 24 hours) at 03/20/2017 1322 Last data filed at 03/20/2017 0840 Gross per 24 hour  Intake 517.33 ml  Output 1800 ml  Net -1282.67 ml   Filed Weights   03/18/17 2206 03/19/17 0349 03/20/17 0301  Weight: 73.3 kg (161 lb 9.6 oz) 73 kg (160 lb 15 oz) 72.2 kg (159 lb 2.8 oz)    Examination: Constitutional: NAD Eyes: No scleral  icterus ENMT: Moist mucous membranes Respiratory: Clear to auscultation bilaterally without wheezing or crackles, normal respiratory effort without accessory muscle use Cardiovascular: Regular rate and rhythm without murmurs rubs or gallops.  No edema. Abdomen: Soft, nontender, nondistended, bowel sounds positive Skin: no rashes seen  Neurologic: nonfocal    Data Reviewed: I have independently reviewed following labs and imaging studies   CBC: Recent Labs  Lab 03/15/17 1313  03/16/17 0305 03/17/17 1556 03/18/17 0403 03/19/17 0522 03/20/17 0214  WBC 15.1*  --  14.7* 5.8 6.0 6.4 5.9  NEUTROABS 13.8*  --   --  2.8  --   --   --   HGB 13.6   < > 10.8* 11.4* 10.6* 10.3* 10.8*  HCT 39.8   < > 32.0* 34.3* 32.2* 31.2* 32.1*  MCV 90.5  --  91.2 92.0 91.5 91.5 91.2  PLT 538*  --  400 420* 357 343 355   < > = values in this interval not displayed.   Basic Metabolic Panel: Recent Labs  Lab 03/17/17 1556 03/18/17 0403 03/19/17 0522 03/20/17 0214 03/20/17 0902  NA 138 141 140 140 135  K 3.7 3.3* 3.2* 3.9 4.1  CL 98* 106 105 104 101  CO2 21* 24 26 24  21*  GLUCOSE 288* 152* 186* 199* 358*  BUN 7 6 <5* 6 10  CREATININE 0.83 0.81 0.71 0.80 0.97  CALCIUM 8.2* 8.4* 8.2* 8.9 8.8*   GFR: Estimated Creatinine Clearance: 70.7 mL/min (by C-G formula based on SCr of 0.97 mg/dL). Liver Function Tests: Recent Labs  Lab 03/15/17 1313 03/17/17 1556 03/18/17 0403  AST 31 50* 28  ALT 26 22 20   ALKPHOS 77 61 51  BILITOT 1.2 0.8 0.7  PROT 8.4* 6.6 5.6*  ALBUMIN 4.7 3.5 3.0*   Recent Labs  Lab 03/15/17 1313  LIPASE 19   No results for input(s): AMMONIA in the last 168 hours. Coagulation Profile: Recent Labs  Lab 03/17/17 1556  INR 1.04   Cardiac Enzymes: Recent Labs  Lab 03/18/17 0823 03/18/17 1628 03/18/17 2221 03/19/17 0522  TROPONINI 1.24* 1.22* 1.19* 0.91*   BNP (last 3 results) No results for input(s): PROBNP in the last 8760 hours. HbA1C: No results for  input(s): HGBA1C in the last 72 hours. CBG: Recent Labs  Lab 03/19/17 1705 03/19/17 2105 03/19/17 2333 03/20/17 0303 03/20/17 0807  GLUCAP 135* 196* 271* 226* 361*   Lipid Profile: Recent Labs    03/19/17 0522  CHOL 197  HDL 56  LDLCALC 126*  TRIG 74  CHOLHDL 3.5   Thyroid Function Tests: No results for input(s): TSH, T4TOTAL, FREET4, T3FREE, THYROIDAB in the last 72 hours. Anemia Panel: No results for input(s): VITAMINB12, FOLATE, FERRITIN, TIBC, IRON, RETICCTPCT in the last 72 hours. Urine analysis:    Component Value Date/Time   COLORURINE STRAW (A) 03/17/2017 1555   APPEARANCEUR CLEAR 03/17/2017 1555   LABSPEC 1.003 (L) 03/17/2017 1555   PHURINE 7.0 03/17/2017 1555   GLUCOSEU 150 (A) 03/17/2017 [place] 11/23/2015 0831  HGBUR SMALL (A) 03/17/2017 1555   BILIRUBINUR NEGATIVE 03/17/2017 1555   BILIRUBINUR neg 12/07/2015 0843   KETONESUR NEGATIVE 03/17/2017 1555   PROTEINUR NEGATIVE 03/17/2017 1555   UROBILINOGEN 1.0 12/07/2015 0843   UROBILINOGEN 0.2 11/23/2015 0831   NITRITE NEGATIVE 03/17/2017 1555   LEUKOCYTESUR NEGATIVE 03/17/2017 1555   Sepsis Labs: Invalid input(s): PROCALCITONIN, LACTICIDVEN  Recent Results (from the past 240 hour(s))  Urine culture     Status: Abnormal   Collection Time: 03/15/17  9:30 PM  Result Value Ref Range Status   Specimen Description URINE, CATHETERIZED  Final   Special Requests NONE  Final   Culture (A)  Final    <10,000 COLONIES/mL INSIGNIFICANT GROWTH Performed at [facility], [address] [place], [place] [address]    Report Status 03/17/2017 FINAL  Final  Blood culture (routine x 2)     Status: Abnormal   Collection Time: 03/15/17 10:40 PM  Result Value Ref Range Status   Specimen Description BLOOD RIGHT ANTECUBITAL  Final   Special Requests   Final    BOTTLES DRAWN AEROBIC AND ANAEROBIC Blood Culture adequate volume   Culture  Setup Time   Final    GRAM NEGATIVE RODS AEROBIC BOTTLE  ONLY CRITICAL RESULT CALLED TO, READ BACK BY AND VERIFIED WITH: Sarita Bottom AT 1353 03/17/17 L BENFIELD Performed at [facility], [place] [address], [place], [place] [address]    Culture ACHROMOBACTER DENITRIFICANS (A)  Final   Report Status 03/19/2017 FINAL  Final   Organism ID, Bacteria ACHROMOBACTER DENITRIFICANS  Final      Susceptibility   Achromobacter denitrificans - MIC*    CEFEPIME 16 INTERMEDIATE Intermediate     CEFAZOLIN >=64 RESISTANT Resistant     GENTAMICIN >=16 RESISTANT Resistant     CIPROFLOXACIN >=4 RESISTANT Resistant     IMIPENEM 0.5 SENSITIVE Sensitive     TRIMETH/SULFA <=20 SENSITIVE Sensitive     * ACHROMOBACTER DENITRIFICANS  Blood Culture ID Panel (Reflexed)     Status: None   Collection Time: 03/15/17 10:40 PM  Result Value Ref Range Status   Enterococcus species NOT DETECTED NOT DETECTED Final   Listeria monocytogenes NOT DETECTED NOT DETECTED Final   Staphylococcus species NOT DETECTED NOT DETECTED Final   Staphylococcus aureus NOT DETECTED NOT DETECTED Final   Streptococcus species NOT DETECTED NOT DETECTED Final   Streptococcus agalactiae NOT DETECTED NOT DETECTED Final   Streptococcus pneumoniae NOT DETECTED NOT DETECTED Final   Streptococcus pyogenes NOT DETECTED NOT DETECTED Final   Acinetobacter baumannii NOT DETECTED NOT DETECTED Final   Enterobacteriaceae species NOT DETECTED NOT DETECTED Final   Enterobacter cloacae complex NOT DETECTED NOT DETECTED Final   Escherichia coli NOT DETECTED NOT DETECTED Final   Klebsiella oxytoca NOT DETECTED NOT DETECTED Final   Klebsiella pneumoniae NOT DETECTED NOT DETECTED Final   Proteus species NOT DETECTED NOT DETECTED Final   Serratia marcescens NOT DETECTED NOT DETECTED Final   Haemophilus influenzae NOT DETECTED NOT DETECTED Final   Neisseria meningitidis NOT DETECTED NOT DETECTED Final   Pseudomonas aeruginosa NOT DETECTED NOT DETECTED Final   Candida albicans NOT DETECTED NOT DETECTED Final    Candida glabrata NOT DETECTED NOT DETECTED Final   Candida krusei NOT DETECTED NOT DETECTED Final   Candida parapsilosis NOT DETECTED NOT DETECTED Final   Candida tropicalis NOT DETECTED NOT DETECTED Final    Comment: Performed at [facility], [place] [address], [place], [place] [address]  Blood culture (routine  x 2)     Status: None (Preliminary result)   Collection Time: 03/15/17 11:46 PM  Result Value Ref Range Status   Specimen Description BLOOD LEFT ANTECUBITAL  Final   Special Requests   Final    BOTTLES DRAWN AEROBIC AND ANAEROBIC Blood Culture adequate volume   Culture   Final    NO GROWTH 4 DAYS Performed at [facility], [place] [address] [place], [place] [address]    Report Status PENDING  Incomplete  Culture, blood (Routine x 2)     Status: None (Preliminary result)   Collection Time: 03/17/17  3:56 PM  Result Value Ref Range Status   Specimen Description BLOOD LEFT ANTECUBITAL  Final   Special Requests   Final    BOTTLES DRAWN AEROBIC AND ANAEROBIC Blood Culture adequate volume   Culture   Final    NO GROWTH 3 DAYS Performed at [facility], [place] [address] [place], [place] [address]    Report Status PENDING  Incomplete  Culture, blood (Routine x 2)     Status: None (Preliminary result)   Collection Time: 03/17/17  5:50 PM  Result Value Ref Range Status   Specimen Description BLOOD RIGHT ANTECUBITAL  Final   Special Requests   Final    BOTTLES DRAWN AEROBIC AND ANAEROBIC Blood Culture adequate volume   Culture   Final    NO GROWTH 3 DAYS Performed at [facility], [place] [address] [place], [place] [address]    Report Status PENDING  Incomplete  MRSA PCR Screening     Status: None   Collection Time: 03/18/17 10:42 PM  Result Value Ref Range Status   MRSA by PCR NEGATIVE NEGATIVE Final    Comment:        The GeneXpert MRSA Assay (FDA approved for NASAL specimens only), is one component of a comprehensive MRSA colonization surveillance  program. It is not intended to diagnose MRSA infection nor to guide or monitor treatment for MRSA infections. Performed at [facility], [place] [address] [place], [place] [address]       Radiology Studies: Ct Coronary Morph W/cta Cor Nancy Fetter W/ca W/cm &/or Wo/cm  Addendum Date: 03/19/2017   ADDENDUM REPORT: 03/19/2017 17:42 CLINICAL DATA:  47 year old female with atypical chest pain, bacteremia, elevated troponin and an echocardiogram suspicious for a stress induced cardiomyopathy. EXAM: Cardiac/Coronary  CT TECHNIQUE: The patient was scanned on a [employer]. FINDINGS: A 120 kV prospective scan was triggered in the descending thoracic aorta at 111 HU's. Axial non-contrast 3 mm slices were carried out through the heart. The data set was analyzed on a dedicated work station and scored using the [place]. Gantry rotation speed was 250 msecs and collimation was .6 mm. No beta blockade and 0.8 mg of sl NTG was given. The 3D data set was reconstructed in 5% intervals of the 67-82 % of the R-R cycle. Diastolic phases were analyzed on a dedicated work station using MPR, MIP and VRT modes. The patient received 80 cc of contrast. Aorta:  Normal size.  No calcifications.  No dissection. Aortic Valve:  Trileaflet.  No calcifications. Coronary Arteries:  Normal coronary origin.  Right dominance. RCA is a large dominant artery that gives rise to PDA and a small PLA. There is no plaque. Left main is a large artery that gives rise to LAD and LCX arteries. There is no plaque. LAD is a large vessel that gives rise to one large diagonal  artery. Proximal LAD is affected by motion however there appears to be a moderate non-calcified plaque with associated with 50-69% stenosis but possibly > 70 stenosis. Mid and distal LAD have another mild noncalcified plaques with associated stenoses 25-50%. D1 is fairly large and has no significant plaque. LCX is a small non-dominant artery. Other findings: Normal  pulmonary vein drainage into the left atrium. Normal let atrial appendage without a thrombus. Normal size of the pulmonary artery. IMPRESSION: 1. Coronary calcium score of 0. This was 0 percentile for age and sex matched control. 2. Normal coronary origin with right dominance. 3. Proximal LAD is affected by motion however there appears to be at least moderate non-calcified plaque. A cardiac catheterization is recommended. Electronically Signed   By: [name]   On: 03/19/2017 17:42   Result Date: 03/19/2017 EXAM: OVER-READ INTERPRETATION  CT CHEST The following report is an over-read performed by radiologist Dr. Aletta Edouard of [facility], [place] on 03/19/2017. This over-read does not include interpretation of cardiac or coronary anatomy or pathology. The coronary calcium score/coronary CTA interpretation by the cardiologist is attached. COMPARISON:  None. FINDINGS: Vascular: Central pulmonary arteries are normal in caliber. No evidence of aneurysmal disease of the visualized thoracic aorta or proximal abdominal aorta. Mediastinum/Nodes: No enlarged lymph nodes identified. No evidence of hiatal hernia. Lungs/Pleura: Visualized lungs show some mild scarring/atelectasis at both lung bases. No edema, nodule or pleural fluid identified. Upper Abdomen: No acute abnormality. Musculoskeletal: No chest wall mass or suspicious bone lesions identified. IMPRESSION: No significant noncardiac findings in the visualized portion of the chest. Electronically Signed: By: Aletta Edouard M.D. On: 03/19/2017 15:24   Ct Coronary Fractional Flow Reserve Fluid Analysis  Result Date: 03/20/2017 EXAM: FF/RCT ANALYSIS FINDINGS: FFRct analysis was performed on the original cardiac CT angiogram dataset. Diagrammatic representation of the FFRct analysis is provided in a separate PDF document in PACS. This dictation was created using the PDF document and an interactive 3D model of the results. 3D model is not available in  the EMR/PACS. Normal FFR range is >0.80. 1. Left Main:  No significant stenosis 2. LAD: Proximal CT FFR: 0.86, mid: 0.75, distal: 0.67. 3. D1: CT FFR: 0.85. 4. LCX: Proximal CT FFR: 0.86, distal: 0.81. 5. RCA: No significant stenosis. IMPRESSION: 1. CT FFR analysis showed hemodynamically significant stenosis in the proximal and mid LAD, a cardiac catheterization is recommended. Electronically Signed   By: [name]   On: 03/20/2017 12:05     Scheduled Meds: . [MAR Hold] ALPRAZolam  0.5 mg Oral Once  . [START ON 03/21/2017] aspirin  81 mg Oral Pre-Cath  . [MAR Hold] aspirin EC  81 mg Oral Daily  . [MAR Hold] atorvastatin  80 mg Oral QHS  . [MAR Hold] buPROPion  300 mg Oral q morning - 10a  . [MAR Hold] carvedilol  3.125 mg Oral BID WC  . [MAR Hold] cyclobenzaprine  10 mg Oral QHS  . [MAR Hold] FLUoxetine  30 mg Oral Daily  . [MAR Hold] fluticasone  2 spray Each Nare Daily  . [MAR Hold] insulin pump   Subcutaneous TID AC, HS, 0200  . [MAR Hold] levothyroxine  112 mcg Oral QAC breakfast  . [MAR Hold] losartan  25 mg Oral Daily  . [MAR Hold] montelukast  10 mg Oral QHS  . sodium chloride flush  3 mL Intravenous Q12H   Continuous Infusions: . sodium chloride    . [START ON 03/21/2017] sodium chloride     Followed  by  . Derrill Memo ON 03/21/2017] sodium chloride    . heparin Stopped (03/20/17 1014)  . [MAR Hold] meropenem (MERREM) IV Stopped (03/20/17 0420)     Marzetta Board, MD, PhD [facility] Pager [phone]  If 7PM-7AM, please contact night-coverage [email or web address] Password [email or web address] 03/20/2017, 1:22 PM

## 2017-03-20 NOTE — Care Management Note (Signed)
Case Management Note  Patient Details  Name: Jennifer Hogan MRN: [id number] Date of Birth: [birth year]  Subjective/Objective:   From home,pta indep,s/p coronary stent intervention, will be on brilinta, she does not have insurance or pcp, NCM scheduled a follow up apt for her at the [facility] clinic 3/6 . NCM gave her 30 day free savings coupon ,she will be able to go to clinic for refill while waiting on patient assistance to go thru.  NCM gave her application for patient assitance.                   Action/Plan: DC home when medically ready.   Expected Discharge Date:                  Expected Discharge Plan:  Home/Self Care  In-House Referral:     Discharge planning Services  CM Consult, Medication Assistance, [facility], Follow-up appt scheduled  Post Acute Care Choice:    Choice offered to:     DME Arranged:    DME Agency:     HH Arranged:    HH Agency:     Status of Service:  Completed, signed off  If discussed at [employer], dates discussed:    Additional Comments:  Zenon Mayo, RN 03/20/2017, 3:57 PM

## 2017-03-20 NOTE — Progress Notes (Signed)
Insulin pump applied; pt voided; L cath consent signed; glasses removed & on bedside table.   [place]  Jazalynn Mireles, RN

## 2017-03-21 ENCOUNTER — Telehealth: Payer: Self-pay | Admitting: Cardiology

## 2017-03-21 ENCOUNTER — Encounter (HOSPITAL_COMMUNITY)
Admission: EM | Disposition: A | Discharge status: Home Health | Payer: Self-pay | Source: Home / Self Care | Attending: Internal Medicine

## 2017-03-21 ENCOUNTER — Encounter (HOSPITAL_COMMUNITY): Payer: Self-pay | Admitting: Physician Assistant

## 2017-03-21 DIAGNOSIS — I2583 Coronary atherosclerosis due to lipid rich plaque: Secondary | ICD-10-CM

## 2017-03-21 DIAGNOSIS — I420 Dilated cardiomyopathy: Secondary | ICD-10-CM

## 2017-03-21 LAB — GLUCOSE, CAPILLARY
GLUCOSE-CAPILLARY: 135 mg/dL — AB (ref 65–99)
GLUCOSE-CAPILLARY: 225 mg/dL — AB (ref 65–99)
Glucose-Capillary: 114 mg/dL — ABNORMAL HIGH (ref 65–99)
Glucose-Capillary: 182 mg/dL — ABNORMAL HIGH (ref 65–99)
Glucose-Capillary: 82 mg/dL (ref 65–99)

## 2017-03-21 LAB — POCT I-STAT, CHEM 8
BUN: 10 mg/dL (ref 6–20)
BUN: 9 mg/dL (ref 6–20)
CALCIUM ION: 1.15 mmol/L (ref 1.15–1.40)
CHLORIDE: 75 mmol/L — AB (ref 101–111)
CHLORIDE: 76 mmol/L — AB (ref 101–111)
CREATININE: 0.2 mg/dL — AB (ref 0.44–1.00)
Calcium, Ion: 1.13 mmol/L — ABNORMAL LOW (ref 1.15–1.40)
Creatinine, Ser: 0.2 mg/dL — ABNORMAL LOW (ref 0.44–1.00)
GLUCOSE: 65 mg/dL (ref 65–99)
GLUCOSE: 96 mg/dL (ref 65–99)
HCT: 34 % — ABNORMAL LOW (ref 36.0–46.0)
HCT: 34 % — ABNORMAL LOW (ref 36.0–46.0)
HEMOGLOBIN: 11.6 g/dL — AB (ref 12.0–15.0)
Hemoglobin: 11.6 g/dL — ABNORMAL LOW (ref 12.0–15.0)
POTASSIUM: 3.1 mmol/L — AB (ref 3.5–5.1)
POTASSIUM: 3.1 mmol/L — AB (ref 3.5–5.1)
Sodium: 114 mmol/L — CL (ref 135–145)
Sodium: 116 mmol/L — CL (ref 135–145)
TCO2: 20 mmol/L — ABNORMAL LOW (ref 22–32)
TCO2: 22 mmol/L (ref 22–32)

## 2017-03-21 LAB — CBC
HCT: 30.8 % — ABNORMAL LOW (ref 36.0–46.0)
Hemoglobin: 10.4 g/dL — ABNORMAL LOW (ref 12.0–15.0)
MCH: 31 pg (ref 26.0–34.0)
MCHC: 33.8 g/dL (ref 30.0–36.0)
MCV: 91.9 fL (ref 78.0–100.0)
PLATELETS: 333 10*3/uL (ref 150–400)
RBC: 3.35 MIL/uL — ABNORMAL LOW (ref 3.87–5.11)
RDW: 13.3 % (ref 11.5–15.5)
WBC: 5.5 10*3/uL (ref 4.0–10.5)

## 2017-03-21 LAB — CULTURE, BLOOD (ROUTINE X 2)
Culture: NO GROWTH
Special Requests: ADEQUATE

## 2017-03-21 LAB — BASIC METABOLIC PANEL
Anion gap: 13 (ref 5–15)
BUN: 9 mg/dL (ref 6–20)
CALCIUM: 9.1 mg/dL (ref 8.9–10.3)
CO2: 23 mmol/L (ref 22–32)
CREATININE: 0.82 mg/dL (ref 0.44–1.00)
Chloride: 102 mmol/L (ref 101–111)
GLUCOSE: 117 mg/dL — AB (ref 65–99)
Potassium: 3.5 mmol/L (ref 3.5–5.1)
Sodium: 138 mmol/L (ref 135–145)

## 2017-03-21 SURGERY — LEFT HEART CATH AND CORONARY ANGIOGRAPHY
Anesthesia: LOCAL

## 2017-03-21 MED ORDER — TICAGRELOR 90 MG PO TABS: 90.0000 mg | ORAL_TABLET | Freq: Two times a day (BID) | ORAL | 3 refills | Status: DC

## 2017-03-21 MED ORDER — LOSARTAN POTASSIUM 25 MG PO TABS: 25.0000 mg | ORAL_TABLET | Freq: Every day | ORAL | 4 refills | Status: DC

## 2017-03-21 MED ORDER — ISOSORBIDE MONONITRATE ER 30 MG PO TB24: 15.0000 mg | ORAL_TABLET | Freq: Every day | ORAL | Status: DC

## 2017-03-21 MED ORDER — TICAGRELOR 90 MG PO TABS: 90.0000 mg | ORAL_TABLET | Freq: Two times a day (BID) | ORAL | 0 refills | Status: DC

## 2017-03-21 MED ORDER — SULFAMETHOXAZOLE-TRIMETHOPRIM 400-80 MG PO TABS: 1.0000 | ORAL_TABLET | Freq: Two times a day (BID) | ORAL | 0 refills | Status: DC

## 2017-03-21 MED ORDER — ASPIRIN 81 MG PO TBEC: 81.0000 mg | DELAYED_RELEASE_TABLET | Freq: Every day | ORAL | 4 refills | Status: DC

## 2017-03-21 MED ORDER — SODIUM CHLORIDE 0.9 % IV BOLUS (SEPSIS)
250.0000 mL | INTRAVENOUS | Status: AC
  Administered 2017-03-21: 250 mL via INTRAVENOUS

## 2017-03-21 MED ORDER — NITROGLYCERIN 0.4 MG SL SUBL: 0.4000 mg | SUBLINGUAL_TABLET | SUBLINGUAL | 12 refills | Status: DC | PRN

## 2017-03-21 MED ORDER — TICAGRELOR 90 MG PO TABS: 90.0000 mg | ORAL_TABLET | Freq: Two times a day (BID) | ORAL | 4 refills | Status: DC

## 2017-03-21 MED ORDER — CARVEDILOL 3.125 MG PO TABS: 3.1250 mg | ORAL_TABLET | Freq: Two times a day (BID) | ORAL | 4 refills | Status: DC

## 2017-03-21 MED ORDER — MELOXICAM 15 MG PO TABS: 7.5000 mg | ORAL_TABLET | Freq: Every evening | ORAL | Status: DC | PRN

## 2017-03-21 MED ORDER — ATORVASTATIN CALCIUM 80 MG PO TABS: 80.0000 mg | ORAL_TABLET | Freq: Every day | ORAL | 3 refills | Status: DC

## 2017-03-21 MED FILL — Heparin Sodium (Porcine) 2 Unit/ML in Sodium Chloride 0.9%: INTRAMUSCULAR | Qty: 1000 | Status: AC

## 2017-03-21 MED FILL — Lidocaine HCl Local Inj 1%: INTRAMUSCULAR | Qty: 20 | Status: AC

## 2017-03-21 NOTE — Telephone Encounter (Signed)
According to the pts chart she is still in the hospital as she remains orthostatic. Will forward message to Arlester Marker, RN to contact the pt tomorrow, if she has been discharged from the hospital.

## 2017-03-21 NOTE — Progress Notes (Signed)
Patient remains orthostatic, lying BP=83/52 HR=85, sitting BP=80/55 HR=102, standing BP=83/47 HR=104, Patient states "I am very weak and sleepy" Rhonda PA notified and stated to start another 250cc NS Bolus over and hour. Bolus infusing. Will re-assess. NO other S/S of distress noted or comp;aints voiced at this time. CBG=182 and patient is monitoring with her insulin pump.

## 2017-03-21 NOTE — Progress Notes (Addendum)
[facility]                                                                              Patient Demographics  Jennifer Hogan, is a 47 y.o. female, DOB - February 02, 1970, TKZ:[id number]  Admit date - 03/17/2017   Admitting Physician Jani Gravel, MD  Outpatient Primary MD for the patient is Hoyt Koch, MD  Outpatient specialists:   LOS - 3  days   Medical records reviewed and are as summarized below:    Chief Complaint  Patient presents with  . Abnormal Lab       Brief summary    Patient is 47 year old female with type 1 diabetes mellitus on chronic insulin pump, hypothyroidism, was recently admitted to the hospital in the setting of a GI illness with nausea vomiting diarrhea, felt to be dehydrated, received IV fluids and was discharged home. During that admission underwent blood cultures that eventually speciated gram-negative rods, patient was called to come back. She also underwent an echo which showed an depressed EF of 45-50%.   Assessment & Plan    Principal Problem:   Bacteremia, gram-negative on cultures on 2/21 -Most likely related to her transient GI illness.  Initial blood cultures organism identified as Achromobacter Denitrificans, currently on meropenem  -Repeat blood cultures on 2/23 negative so far - Dr Cruzita Lederer discussed with ID, recommended carboplatinum while inpatient and can be transitioned to Bactrim at discharge, recommended 5-7-day total course   Active Problems: Chronic systolic CHF/ NSTEMI -2D echo 2/22 showed EF of 45-50% with hypokinesis of apical myocardium -Cardiology was consulted, underwent cardiac cath which showed severe triple-vessel CAD, received stent x4, PTCA/DES x 3 in the LAD (ostial DES placed, mid DES placed, distal DES placed).  PTCA/DES x 1 intermediate branch.  - currently hypotensive, orthostatic. Hold Coreg, Imdur, lisinopril -Continue dual antiplatelet agents, aspirin and Brilinta, statin -Patient  received IV fluid bolus, feeling better, BP however still in 80s on standing with tachycardia low 100's     Type 1 diabetes mellitus (HCC) -Continue insulin pump, CBGs fairly controlled  Depression -Currently stable, continue Wellbutrin, Prozac   Hypothyroidism - continue Synthroid   Code Status: Full code DVT Prophylaxis:  SCD's Family Communication: Discussed in detail with the patient, all imaging results, lab results explained to the patient   Disposition Plan: DC placed on hold secondary to hypotension  Time Spent in minutes 35 minutes  Procedures:  Cardiac cath 2/26 1. Severe double vessel CAD. 2. Severe stenosis in the ostial LAD, mid LAD and distal LAD. PTCA/DES x 3 in the LAD (ostial DES placed, mid DES placed, distal DES placed).  3. Severe stenosis intermediate branch. Successful PTCA/DES x 1 intermediate branch.  4. Moderate non-obstructive disease in the proximal and mid Circumflex.  5. Severe stenosis in the apical LAD. Due to the distal location, no PCI performed.   Recommendations: Continue DAPT with ASA and Brilinta for one year. Continue statin and beta blocker  Consultants:   Cardiology  Antimicrobials:      Medications  Scheduled Meds: . aspirin EC  81 mg Oral Daily  . atorvastatin  80 mg Oral QHS  .  buPROPion  300 mg Oral q morning - 10a  . cyclobenzaprine  10 mg Oral QHS  . FLUoxetine  30 mg Oral Daily  . fluticasone  2 spray Each Nare Daily  . insulin aspart  1,000 Units Subcutaneous Once  . insulin pump   Subcutaneous TID AC, HS, 0200  . levothyroxine  112 mcg Oral QAC breakfast  . montelukast  10 mg Oral QHS  . sodium chloride flush  3 mL Intravenous Q12H  . ticagrelor  90 mg Oral BID   Continuous Infusions: . sodium chloride Stopped (03/21/17 1255)  . meropenem (MERREM) IV Stopped (03/21/17 1330)   PRN Meds:.sodium chloride, acetaminophen **OR** acetaminophen, albuterol, meloxicam, morphine injection, nitroGLYCERIN, ondansetron  (ZOFRAN) IV, polyvinyl alcohol, sodium chloride flush   Antibiotics   Anti-infectives (From admission, onward)   Start     Dose/Rate Route Frequency Ordered Stop   03/21/17 0000  sulfamethoxazole-trimethoprim (BACTRIM) 400-80 MG tablet     1 tablet Oral 2 times daily 03/21/17 0807     03/19/17 1230  meropenem (MERREM) 1 g in sodium chloride 0.9 % 100 mL IVPB     1 g 200 mL/hr over 30 Minutes Intravenous Every 8 hours 03/19/17 1143     03/18/17 0600  vancomycin (VANCOCIN) IVPB 1000 mg/200 mL premix  Status:  Discontinued     1,000 mg 200 mL/hr over 60 Minutes Intravenous Every 12 hours 03/17/17 1752 03/17/17 2215   03/18/17 0000  piperacillin-tazobactam (ZOSYN) IVPB 3.375 g  Status:  Discontinued     3.375 g 12.5 mL/hr over 240 Minutes Intravenous Every 8 hours 03/17/17 1752 03/19/17 1158   03/17/17 1730  piperacillin-tazobactam (ZOSYN) IVPB 3.375 g     3.375 g 100 mL/hr over 30 Minutes Intravenous  Once 03/17/17 1726 03/17/17 1825   03/17/17 1730  vancomycin (VANCOCIN) IVPB 1000 mg/200 mL premix     1,000 mg 200 mL/hr over 60 Minutes Intravenous  Once 03/17/17 1726 03/17/17 1923        Subjective:   Jennifer Hogan was seen and examined today.  BP currently low, orthostatic, however feeling better after IV fluids.  No chest pain. Patient denies dizziness, abdominal pain, N/V/D/C, new weakness, numbess, tingling. No acute events overnight.    Objective:   Vitals:   03/21/17 0911 03/21/17 1123 03/21/17 1327 03/21/17 1415  BP: (!) 93/57  129/71 (!) 81/57  Pulse:      Resp: 11 16 (!) 26 17  Temp:  98.5 F (36.9 C)    TempSrc:  Oral    SpO2:  100%    Weight:      Height:        Intake/Output Summary (Last 24 hours) at 03/21/2017 1511 Last data filed at 03/21/2017 1320 Gross per 24 hour  Intake 2124.17 ml  Output 3401 ml  Net -1276.83 ml     Wt Readings from Last 3 Encounters:  03/21/17 72 kg (158 lb 11.7 oz)  03/15/17 74.8 kg (165 lb)  01/17/17 74.8 kg (165 lb)      Exam  General: Alert and oriented x 3, NAD  Eyes:   HEENT:    Cardiovascular: S1 S2 auscultated, no rubs, murmurs or gallops. Regular rate and rhythm.  Respiratory: Clear to auscultation bilaterally, no wheezing, rales or rhonchi  Gastrointestinal: Soft, nontender, nondistended, + bowel sounds  Ext: no pedal edema bilaterally  Neuro: AAOx3, Cr N's II- XII. Strength 5/5 upper and lower extremities bilaterally, speech clear, sensations grossly intact  Musculoskeletal: No digital cyanosis,  clubbing  Skin: No rashes  Psych: Normal affect and demeanor, alert and oriented x3    Data Reviewed:  I have personally reviewed following labs and imaging studies  Micro Results Recent Results (from the past 240 hour(s))  Urine culture     Status: Abnormal   Collection Time: 03/15/17  9:30 PM  Result Value Ref Range Status   Specimen Description URINE, CATHETERIZED  Final   Special Requests NONE  Final   Culture (A)  Final    <10,000 COLONIES/mL INSIGNIFICANT GROWTH Performed at [facility], [address], [place], [place] [address]    Report Status 03/17/2017 FINAL  Final  Blood culture (routine x 2)     Status: Abnormal   Collection Time: 03/15/17 10:40 PM  Result Value Ref Range Status   Specimen Description BLOOD RIGHT ANTECUBITAL  Final   Special Requests   Final    BOTTLES DRAWN AEROBIC AND ANAEROBIC Blood Culture adequate volume   Culture  Setup Time   Final    GRAM NEGATIVE RODS AEROBIC BOTTLE ONLY CRITICAL RESULT CALLED TO, READ BACK BY AND VERIFIED WITH: Sarita Bottom AT 1353 03/17/17 L BENFIELD Performed at [facility], [place] [address], [place], [place] [address]    Culture ACHROMOBACTER DENITRIFICANS (A)  Final   Report Status 03/19/2017 FINAL  Final   Organism ID, Bacteria ACHROMOBACTER DENITRIFICANS  Final      Susceptibility   Achromobacter denitrificans - MIC*    CEFEPIME 16 INTERMEDIATE Intermediate     CEFAZOLIN >=64 RESISTANT Resistant      GENTAMICIN >=16 RESISTANT Resistant     CIPROFLOXACIN >=4 RESISTANT Resistant     IMIPENEM 0.5 SENSITIVE Sensitive     TRIMETH/SULFA <=20 SENSITIVE Sensitive     * ACHROMOBACTER DENITRIFICANS  Blood Culture ID Panel (Reflexed)     Status: None   Collection Time: 03/15/17 10:40 PM  Result Value Ref Range Status   Enterococcus species NOT DETECTED NOT DETECTED Final   Listeria monocytogenes NOT DETECTED NOT DETECTED Final   Staphylococcus species NOT DETECTED NOT DETECTED Final   Staphylococcus aureus NOT DETECTED NOT DETECTED Final   Streptococcus species NOT DETECTED NOT DETECTED Final   Streptococcus agalactiae NOT DETECTED NOT DETECTED Final   Streptococcus pneumoniae NOT DETECTED NOT DETECTED Final   Streptococcus pyogenes NOT DETECTED NOT DETECTED Final   Acinetobacter baumannii NOT DETECTED NOT DETECTED Final   Enterobacteriaceae species NOT DETECTED NOT DETECTED Final   Enterobacter cloacae complex NOT DETECTED NOT DETECTED Final   Escherichia coli NOT DETECTED NOT DETECTED Final   Klebsiella oxytoca NOT DETECTED NOT DETECTED Final   Klebsiella pneumoniae NOT DETECTED NOT DETECTED Final   Proteus species NOT DETECTED NOT DETECTED Final   Serratia marcescens NOT DETECTED NOT DETECTED Final   Haemophilus influenzae NOT DETECTED NOT DETECTED Final   Neisseria meningitidis NOT DETECTED NOT DETECTED Final   Pseudomonas aeruginosa NOT DETECTED NOT DETECTED Final   Candida albicans NOT DETECTED NOT DETECTED Final   Candida glabrata NOT DETECTED NOT DETECTED Final   Candida krusei NOT DETECTED NOT DETECTED Final   Candida parapsilosis NOT DETECTED NOT DETECTED Final   Candida tropicalis NOT DETECTED NOT DETECTED Final    Comment: Performed at [facility], [place] [address] [place], [place] [address]  Blood culture (routine x 2)     Status: None   Collection Time: 03/15/17 11:46 PM  Result Value Ref Range Status   Specimen Description BLOOD LEFT ANTECUBITAL  Final  Special  Requests   Final    BOTTLES DRAWN AEROBIC AND ANAEROBIC Blood Culture adequate volume   Culture   Final    NO GROWTH 5 DAYS Performed at [facility], [place] [address] [place], [place] [address]    Report Status 03/21/2017 FINAL  Final  Culture, blood (Routine x 2)     Status: None (Preliminary result)   Collection Time: 03/17/17  3:56 PM  Result Value Ref Range Status   Specimen Description BLOOD LEFT ANTECUBITAL  Final   Special Requests   Final    BOTTLES DRAWN AEROBIC AND ANAEROBIC Blood Culture adequate volume   Culture   Final    NO GROWTH 4 DAYS Performed at [facility], [place] [address] [place], [place] [address]    Report Status PENDING  Incomplete  Culture, blood (Routine x 2)     Status: None (Preliminary result)   Collection Time: 03/17/17  5:50 PM  Result Value Ref Range Status   Specimen Description BLOOD RIGHT ANTECUBITAL  Final   Special Requests   Final    BOTTLES DRAWN AEROBIC AND ANAEROBIC Blood Culture adequate volume   Culture   Final    NO GROWTH 4 DAYS Performed at [facility], [place] [address], [place], [place] [address]    Report Status PENDING  Incomplete  MRSA PCR Screening     Status: None   Collection Time: 03/18/17 10:42 PM  Result Value Ref Range Status   MRSA by PCR NEGATIVE NEGATIVE Final    Comment:        The GeneXpert MRSA Assay (FDA approved for NASAL specimens only), is one component of a comprehensive MRSA colonization surveillance program. It is not intended to diagnose MRSA infection nor to guide or monitor treatment for MRSA infections. Performed at [facility], [place] [address] [place], [place] [address]     Radiology Reports Ct Coronary Morph W/cta Cor Nancy Fetter W/ca W/cm &/or Wo/cm  Addendum Date: 03/19/2017   ADDENDUM REPORT: 03/19/2017 17:42 CLINICAL DATA:  47 year old female with atypical chest pain, bacteremia, elevated troponin and an echocardiogram suspicious for a stress induced cardiomyopathy.  EXAM: Cardiac/Coronary  CT TECHNIQUE: The patient was scanned on a [employer]. FINDINGS: A 120 kV prospective scan was triggered in the descending thoracic aorta at 111 HU's. Axial non-contrast 3 mm slices were carried out through the heart. The data set was analyzed on a dedicated work station and scored using the [place]. Gantry rotation speed was 250 msecs and collimation was .6 mm. No beta blockade and 0.8 mg of sl NTG was given. The 3D data set was reconstructed in 5% intervals of the 67-82 % of the R-R cycle. Diastolic phases were analyzed on a dedicated work station using MPR, MIP and VRT modes. The patient received 80 cc of contrast. Aorta:  Normal size.  No calcifications.  No dissection. Aortic Valve:  [place].  No calcifications. Coronary Arteries:  Normal coronary origin.  Right dominance. RCA is a large dominant artery that gives rise to PDA and a small PLA. There is no plaque. Left main is a large artery that gives rise to LAD and LCX arteries. There is no plaque. LAD is a large vessel that gives rise to one large diagonal artery. Proximal LAD is affected by motion however there appears to be a moderate non-calcified plaque with associated with 50-69% stenosis but possibly > 70 stenosis. Mid and distal LAD have another mild noncalcified  plaques with associated stenoses 25-50%. D1 is fairly large and has no significant plaque. LCX is a small non-dominant artery. Other findings: Normal pulmonary vein drainage into the left atrium. Normal let atrial appendage without a thrombus. Normal size of the pulmonary artery. IMPRESSION: 1. Coronary calcium score of 0. This was 0 percentile for age and sex matched control. 2. Normal coronary origin with right dominance. 3. Proximal LAD is affected by motion however there appears to be at least moderate non-calcified plaque. A cardiac catheterization is recommended. Electronically Signed   By: [name]   On: 03/19/2017 17:42   Result  Date: 03/19/2017 EXAM: OVER-READ INTERPRETATION  CT CHEST The following report is an over-read performed by radiologist Dr. Aletta Edouard of [facility], [place] on 03/19/2017. This over-read does not include interpretation of cardiac or coronary anatomy or pathology. The coronary calcium score/coronary CTA interpretation by the cardiologist is attached. COMPARISON:  None. FINDINGS: Vascular: Central pulmonary arteries are normal in caliber. No evidence of aneurysmal disease of the visualized thoracic aorta or proximal abdominal aorta. Mediastinum/Nodes: No enlarged lymph nodes identified. No evidence of hiatal hernia. Lungs/Pleura: Visualized lungs show some mild scarring/atelectasis at both lung bases. No edema, nodule or pleural fluid identified. Upper Abdomen: No acute abnormality. Musculoskeletal: No chest wall mass or suspicious bone lesions identified. IMPRESSION: No significant noncardiac findings in the visualized portion of the chest. Electronically Signed: By: Aletta Edouard M.D. On: 03/19/2017 15:24   Dg Chest Portable 1 View  Result Date: 03/15/2017 CLINICAL DATA:  Fever, abdominal pain, and nausea and vomiting for 2 days. EXAM: PORTABLE CHEST 1 VIEW COMPARISON:  None. FINDINGS: The cardiomediastinal silhouette is within normal limits. The lungs are well inflated and clear. There is no evidence of pleural effusion or pneumothorax. No acute osseous abnormality is identified. IMPRESSION: No active disease. Electronically Signed   By: Logan Bores M.D.   On: 03/15/2017 17:09   Ct Coronary Fractional Flow Reserve Fluid Analysis  Result Date: 03/20/2017 EXAM: FF/RCT ANALYSIS FINDINGS: FFRct analysis was performed on the original cardiac CT angiogram dataset. Diagrammatic representation of the FFRct analysis is provided in a separate PDF document in PACS. This dictation was created using the PDF document and an interactive 3D model of the results. 3D model is not available in the EMR/PACS. Normal  FFR range is >0.80. 1. Left Main:  No significant stenosis 2. LAD: Proximal CT FFR: 0.86, mid: 0.75, distal: 0.67. 3. D1: CT FFR: 0.85. 4. LCX: Proximal CT FFR: 0.86, distal: 0.81. 5. RCA: No significant stenosis. IMPRESSION: 1. CT FFR analysis showed hemodynamically significant stenosis in the proximal and mid LAD, a cardiac catheterization is recommended. Electronically Signed   By: [name]   On: 03/20/2017 12:05    Lab Data:  CBC: Recent Labs  Lab 03/15/17 1313  03/17/17 1556 03/18/17 0403 03/19/17 0522 03/20/17 0214  03/20/17 1200 03/20/17 1225 03/20/17 1248 03/20/17 1359 03/21/17 0410  WBC 15.1*   < > 5.8 6.0 6.4 5.9  --   --   --   --  5.8 5.5  NEUTROABS 13.8*  --  2.8  --   --   --   --   --   --   --   --   --   HGB 13.6   < > 11.4* 10.6* 10.3* 10.8*   < > 11.6* 11.6* 9.9* 11.6* 10.4*  HCT 39.8   < > 34.3* 32.2* 31.2* 32.1*   < > 34.0* 34.0* 29.0* 34.3*  30.8*  MCV 90.5   < > 92.0 91.5 91.5 91.2  --   --   --   --  91.7 91.9  PLT 538*   < > 420* 357 343 355  --   --   --   --  342 333   < > = values in this interval not displayed.   Basic Metabolic Panel: Recent Labs  Lab 03/19/17 0522 03/20/17 0214 03/20/17 0902  03/20/17 1200 03/20/17 1225 03/20/17 1248 03/20/17 1359 03/21/17 0410  NA 140 140 135   < > 116* 114* 136 136 138  K 3.2* 3.9 4.1   < > 3.1* 3.1* 3.8 3.9 3.5  CL 105 104 101   < > 76* 75* 97* 98* 102  CO2 26 24 21*  --   --   --   --  25 23  GLUCOSE 186* 199* 358*   < > 65 96 206* 205* 117*  BUN <5* 6 10   < > 10 9 9 9 9   CREATININE 0.71 0.80 0.97   < > 0.20* 0.20* 0.60 0.83 0.82  CALCIUM 8.2* 8.9 8.8*  --   --   --   --  9.0 9.1   < > = values in this interval not displayed.   GFR: Estimated Creatinine Clearance: 83.4 mL/min (by C-G formula based on SCr of 0.82 mg/dL). Liver Function Tests: Recent Labs  Lab 03/15/17 1313 03/17/17 1556 03/18/17 0403  AST 31 50* 28  ALT 26 22 20   ALKPHOS 77 61 51  BILITOT 1.2 0.8 0.7  PROT 8.4* 6.6  5.6*  ALBUMIN 4.7 3.5 3.0*   Recent Labs  Lab 03/15/17 1313  LIPASE 19   No results for input(s): AMMONIA in the last 168 hours. Coagulation Profile: Recent Labs  Lab 03/17/17 1556 03/20/17 1359  INR 1.04 1.06   Cardiac Enzymes: Recent Labs  Lab 03/18/17 0823 03/18/17 1628 03/18/17 2221 03/19/17 0522  TROPONINI 1.24* 1.22* 1.19* 0.91*   BNP (last 3 results) No results for input(s): PROBNP in the last 8760 hours. HbA1C: No results for input(s): HGBA1C in the last 72 hours. CBG: Recent Labs  Lab 03/20/17 1718 03/20/17 2125 03/21/17 0215 03/21/17 0633 03/21/17 1122  GLUCAP 413* 241* 135* 114* 182*   Lipid Profile: Recent Labs    03/19/17 0522  CHOL 197  HDL 56  LDLCALC 126*  TRIG 74  CHOLHDL 3.5   Thyroid Function Tests: No results for input(s): TSH, T4TOTAL, FREET4, T3FREE, THYROIDAB in the last 72 hours. Anemia Panel: No results for input(s): VITAMINB12, FOLATE, FERRITIN, TIBC, IRON, RETICCTPCT in the last 72 hours. Urine analysis:    Component Value Date/Time   COLORURINE STRAW (A) 03/17/2017 1555   APPEARANCEUR CLEAR 03/17/2017 1555   LABSPEC 1.003 (L) 03/17/2017 1555   PHURINE 7.0 03/17/2017 1555   GLUCOSEU 150 (A) 03/17/2017 1555   GLUCOSEU NEGATIVE 11/23/2015 0831   HGBUR SMALL (A) 03/17/2017 1555   BILIRUBINUR NEGATIVE 03/17/2017 1555   BILIRUBINUR neg 12/07/2015 0843   KETONESUR NEGATIVE 03/17/2017 1555   PROTEINUR NEGATIVE 03/17/2017 1555   UROBILINOGEN 1.0 12/07/2015 0843   UROBILINOGEN 0.2 11/23/2015 0831   NITRITE NEGATIVE 03/17/2017 1555   LEUKOCYTESUR NEGATIVE 03/17/2017 1555     Ripudeep Rai M.D. [facility] 03/21/2017, 3:11 PM  Pager: [phone] Between 7am to 7pm - call Pager - [phone]  After 7pm go to [email or web address] - password [email or web address]  Call night coverage person covering after 7pm

## 2017-03-21 NOTE — Progress Notes (Signed)
Progress Note  Patient Name: Jennifer Hogan Date of Encounter: 03/21/2017  Primary Cardiologist: Fransico Him, MD   Subjective   No CP overnight, no SOB.   Inpatient Medications    Scheduled Meds: . ALPRAZolam  0.5 mg Oral Once  . aspirin EC  81 mg Oral Daily  . atorvastatin  80 mg Oral QHS  . buPROPion  300 mg Oral q morning - 10a  . carvedilol  3.125 mg Oral BID WC  . cyclobenzaprine  10 mg Oral QHS  . FLUoxetine  30 mg Oral Daily  . fluticasone  2 spray Each Nare Daily  . insulin aspart  1,000 Units Subcutaneous Once  . insulin pump   Subcutaneous TID AC, HS, 0200  . levothyroxine  112 mcg Oral QAC breakfast  . losartan  25 mg Oral Daily  . montelukast  10 mg Oral QHS  . sodium chloride flush  3 mL Intravenous Q12H  . ticagrelor  90 mg Oral BID   Continuous Infusions: . sodium chloride    . meropenem (MERREM) IV Stopped (03/21/17 0604)   PRN Meds: sodium chloride, acetaminophen **OR** acetaminophen, albuterol, meloxicam, morphine injection, nitroGLYCERIN, ondansetron (ZOFRAN) IV, polyvinyl alcohol, sodium chloride flush   Vital Signs    Vitals:   03/20/17 1700 03/20/17 1945 03/20/17 1949 03/21/17 0433  BP: 130/66  127/76 (!) 110/55  Pulse: 81  (!) 103 78  Resp: 19 20 17 16   Temp:   98 F (36.7 C) 98.2 F (36.8 C)  TempSrc:   Oral Oral  SpO2: 96%  99% 96%  Weight:    158 lb 11.7 oz (72 kg)  Height:        Intake/Output Summary (Last 24 hours) at 03/21/2017 0725 Last data filed at 03/21/2017 0500 Gross per 24 hour  Intake 1285 ml  Output 1700 ml  Net -415 ml   Filed Weights   03/19/17 0349 03/20/17 0301 03/21/17 0433  Weight: 160 lb 15 oz (73 kg) 159 lb 2.8 oz (72.2 kg) 158 lb 11.7 oz (72 kg)    Telemetry    SR - Personally Reviewed  ECG    02/27 ECG, SR, HR 77, inferolateral T wave changes have been seen since 02/24, but not seen on admission- Personally Reviewed  Physical Exam   GEN: NAD.   Neck: JVD not elevated Cardiac: RRR, no  murmurs, rubs, or gallops.  Respiratory: Clear L, rales R base w/ good air exchange Extremities: no LE edema. R radial cath site w/ ecchymosis but no hematoma, pulses intact GI: Soft, nontender, non-distended  MS: No edema; No deformity. Neuro:  Nonfocal  Psych: Normal affect   Labs    Chemistry Recent Labs  Lab 03/15/17 1313  03/17/17 1556 03/18/17 0403  03/20/17 0902  03/20/17 1248 03/20/17 1359 03/21/17 0410  NA 134*   < > 138 141   < > 135   < > 136 136 138  K 4.5   < > 3.7 3.3*   < > 4.1   < > 3.8 3.9 3.5  CL 102   < > 98* 106   < > 101   < > 97* 98* 102  CO2 15*   < > 21* 24   < > 21*  --   --  25 23  GLUCOSE 381*   < > 288* 152*   < > 358*   < > 206* 205* 117*  BUN 22*   < > 7 6   < >  10   < > 9 9 9   CREATININE 1.51*   < > 0.83 0.81   < > 0.97   < > 0.60 0.83 0.82  CALCIUM 9.8   < > 8.2* 8.4*   < > 8.8*  --   --  9.0 9.1  PROT 8.4*  --  6.6 5.6*  --   --   --   --   --   --   ALBUMIN 4.7  --  3.5 3.0*  --   --   --   --   --   --   AST 31  --  50* 28  --   --   --   --   --   --   ALT 26  --  22 20  --   --   --   --   --   --   ALKPHOS 77  --  61 51  --   --   --   --   --   --   BILITOT 1.2  --  0.8 0.7  --   --   --   --   --   --   GFRNONAA 40*   < > >60 >60   < > >60  --   --  >60 >60  GFRAA 46*   < > >60 >60   < > >60  --   --  >60 >60  ANIONGAP 17*   < > 19* 11   < > 13  --   --  13 13   < > = values in this interval not displayed.     Hematology Recent Labs  Lab 03/19/17 0522 03/20/17 0214  03/20/17 1225 03/20/17 1248 03/20/17 1359  WBC 6.4 5.9  --   --   --  5.8  RBC 3.41* 3.52*  --   --   --  3.74*  HGB 10.3* 10.8*   < > 11.6* 9.9* 11.6*  HCT 31.2* 32.1*   < > 34.0* 29.0* 34.3*  MCV 91.5 91.2  --   --   --  91.7  MCH 30.2 30.7  --   --   --  31.0  MCHC 33.0 33.6  --   --   --  33.8  RDW 13.0 12.8  --   --   --  12.8  PLT 343 355  --   --   --  342   < > = values in this interval not displayed.    Cardiac Enzymes Recent Labs  Lab  03/18/17 0823 03/18/17 1628 03/18/17 2221 03/19/17 0522  TROPONINI 1.24* 1.22* 1.19* 0.91*   Lab Results  Component Value Date   CHOL 197 03/19/2017   HDL 56 03/19/2017   LDLCALC 126 (H) 03/19/2017   TRIG 74 03/19/2017   CHOLHDL 3.5 03/19/2017     Radiology    Ct Coronary Morph W/cta Cor W/score W/ca W/cm &/or Wo/cm  Addendum Date: 03/19/2017   ADDENDUM REPORT: 03/19/2017 17:42 CLINICAL DATA:  47 year old female with atypical chest pain, bacteremia, elevated troponin and an echocardiogram suspicious for a stress induced cardiomyopathy. EXAM: Cardiac/Coronary  CT TECHNIQUE: The patient was scanned on a [employer]. FINDINGS: A 120 kV prospective scan was triggered in the descending thoracic aorta at 111 HU's. Axial non-contrast 3 mm slices were carried out through the heart. The data set was analyzed on a dedicated work station and scored using the [place]. Gantry  rotation speed was 250 msecs and collimation was .6 mm. No beta blockade and 0.8 mg of sl NTG was given. The 3D data set was reconstructed in 5% intervals of the 67-82 % of the R-R cycle. Diastolic phases were analyzed on a dedicated work station using MPR, MIP and VRT modes. The patient received 80 cc of contrast. Aorta:  Normal size.  No calcifications.  No dissection. Aortic Valve:  Trileaflet.  No calcifications. Coronary Arteries:  Normal coronary origin.  Right dominance. RCA is a large dominant artery that gives rise to PDA and a small PLA. There is no plaque. Left main is a large artery that gives rise to LAD and LCX arteries. There is no plaque. LAD is a large vessel that gives rise to one large diagonal artery. Proximal LAD is affected by motion however there appears to be a moderate non-calcified plaque with associated with 50-69% stenosis but possibly > 70 stenosis. Mid and distal LAD have another mild noncalcified plaques with associated stenoses 25-50%. D1 is fairly large and has no significant plaque. LCX  is a small non-dominant artery. Other findings: Normal pulmonary vein drainage into the left atrium. Normal let atrial appendage without a thrombus. Normal size of the pulmonary artery. IMPRESSION: 1. Coronary calcium score of 0. This was 0 percentile for age and sex matched control. 2. Normal coronary origin with right dominance. 3. Proximal LAD is affected by motion however there appears to be at least moderate non-calcified plaque. A cardiac catheterization is recommended. Electronically Signed   By: [name]   On: 03/19/2017 17:42   Result Date: 03/19/2017 EXAM: OVER-READ INTERPRETATION  CT CHEST The following report is an over-read performed by radiologist Dr. Aletta Edouard of [facility], [place] on 03/19/2017. This over-read does not include interpretation of cardiac or coronary anatomy or pathology. The coronary calcium score/coronary CTA interpretation by the cardiologist is attached. COMPARISON:  None. FINDINGS: Vascular: Central pulmonary arteries are normal in caliber. No evidence of aneurysmal disease of the visualized thoracic aorta or proximal abdominal aorta. Mediastinum/Nodes: No enlarged lymph nodes identified. No evidence of hiatal hernia. Lungs/Pleura: Visualized lungs show some mild scarring/atelectasis at both lung bases. No edema, nodule or pleural fluid identified. Upper Abdomen: No acute abnormality. Musculoskeletal: No chest wall mass or suspicious bone lesions identified. IMPRESSION: No significant noncardiac findings in the visualized portion of the chest. Electronically Signed: By: Aletta Edouard M.D. On: 03/19/2017 15:24   Ct Coronary Fractional Flow Reserve Fluid Analysis  Result Date: 03/20/2017 EXAM: FF/RCT ANALYSIS FINDINGS: FFRct analysis was performed on the original cardiac CT angiogram dataset. Diagrammatic representation of the FFRct analysis is provided in a separate PDF document in PACS. This dictation was created using the PDF document and an interactive  3D model of the results. 3D model is not available in the EMR/PACS. Normal FFR range is >0.80. 1. Left Main:  No significant stenosis 2. LAD: Proximal CT FFR: 0.86, mid: 0.75, distal: 0.67. 3. D1: CT FFR: 0.85. 4. LCX: Proximal CT FFR: 0.86, distal: 0.81. 5. RCA: No significant stenosis. IMPRESSION: 1. CT FFR analysis showed hemodynamically significant stenosis in the proximal and mid LAD, a cardiac catheterization is recommended. Electronically Signed   By: [name]   On: 03/20/2017 12:05    Cardiac Studies   Echo 03/16/17 Study Conclusions - Left ventricle: The cavity size was normal. Systolic function was mildly reduced. The estimated ejection fraction was in the range of 45% to 50%. Hypokinesis of the apical myocardium. Features  are consistent with a pseudonormal left ventricular filling pattern, with concomitant abnormal relaxation and increased filling pressure (grade 2 diastolic dysfunction). - Pulmonary arteries: Systolic pressure was mildly increased. PA peak pressure: 37 mm Hg (S). Impressions: - Appearnce suggests &quot;apical ballooning&quot;. This could representischemia due to stenosis in a &quot;wrap-around&quot; LAD artery or stress cardiomyopathy (takotsubo syndrome).  Cardiac cath: 03/20/2017  Prox Cx lesion is 40% stenosed.  Mid Cx lesion is 40% stenosed.  Ost 1st Mrg to 1st Mrg lesion is 80% stenosed.  Dist LM to Ost LAD lesion is 70% stenosed.  Prox LAD lesion is 70% stenosed.  Mid LAD-1 lesion is 95% stenosed.  Mid LAD-2 lesion is 95% stenosed.  Dist LAD lesion is 99% stenosed.  A drug-eluting stent was successfully placed using a STENT SYNERGY DES 2.5X16.  Post intervention, there is a 0% residual stenosis.  A drug-eluting stent was successfully placed using a STENT SYNERGY DES 3X12.  Post intervention, there is a 0% residual stenosis.  A drug-eluting stent was successfully placed using a STENT SYNERGY DES 2.75X16.  Post  intervention, there is a 0% residual stenosis.  A drug-eluting stent was successfully placed using a STENT SYNERGY DES 2.25X38.  Post intervention, there is a 0% residual stenosis.  Post intervention, there is a 0% residual stenosis.  1. Severe double vessel CAD. 2. Severe stenosis in the ostial LAD, mid LAD and distal LAD. PTCA/DES x 3 in the LAD (ostial DES placed, mid DES placed, distal DES placed).  3. Severe stenosis intermediate branch. Successful PTCA/DES x 1 intermediate branch.  4. Moderate non-obstructive disease in the proximal and mid Circumflex.  5. Severe stenosis in the apical LAD. Due to the distal location, no PCI performed.  Recommendations: Continue DAPT with ASA and Brilinta for one year. Continue statin and beta blocker.  Post-Intervention Diagram          Patient Profile     47 y.o. female with a hx of type I diabetes, hyperlipidemia, hypothyroidism and family history of CADwho is being seen today for the evaluation of abnormal echocardiogram and EKGat the request of [place].  Assessment & Plan    1. NSTEMI: -Initial trop this admission, 1.24>1.22>1.19>0.91 -EKG with T-wave inversion in II, II, III, avF, V4-V6, new  -Echo with decreased EF to 45-50% with hypokinesis - s/p cath w/ severe 2 v dz>>DES x 4 -continue ASA, high dose statin, BB, ARB, and prn SL NTG  2. Bacteremia: -Empiric IV Abx given + BC's gram negative rods -Afebrile -WBC 5.9 - Per IM  3. Chronic systolic heart failure/ICM: -Echo 03/16/17 with decreased Ef to 45-50% with WMA, hypokinesis -No s/s of fluid volume overload -continue carvedilol 3.125mg  BID and Losartan 25mg  daily - with distal LAD dz would like to add Imdur, not sure BP will tolerate. Will try Imdur 15 mg qd  4. Type I DM: -Per primary, insulin pump  5. Anemia: -CBC, Hb, 11.6 on 02/26  - per IM  6. HLD: -Lipid panel, CHO-197, HDL-56, LDL-126, Trig-74 -changed to high dose statin  7. Hypokalemia: - 3.3  on 02/24 repleted   Plan: cardiac rehab to see, ok for d/c from cards standpoint. Will send msg for f/u appt.   Signed, Rosaria Ferries, PA-C  03/21/2017, 7:25 AM

## 2017-03-21 NOTE — Progress Notes (Signed)
Spoke with RN Caffie Pinto to assure patient has no diabetes questions or needs @ this time. Patient's current CBG is 114 and insulin pump on.  Thank you, Nani Gasser. Tellis Spivak, RN, MSN, CDE  Diabetes Coordinator Inpatient Glycemic Control Team Team Pager [phone] (8am-5pm) 03/21/2017 7:44 AM

## 2017-03-21 NOTE — Progress Notes (Addendum)
Patient noted to be orthostatic, states she feels a little dizzy with standing and weak. Vitals obtained, documented, and reported to [facility]. Orders given to infuse 250cc NS bolus over one hour and hold Losartan and Imdur at this time. Bolus infusing. Will re-assess after bolus is completed.

## 2017-03-21 NOTE — Telephone Encounter (Signed)
Patient has TOC appointment with Lyda Jester 03-29-17 @ 2:00 pm.

## 2017-03-21 NOTE — Progress Notes (Signed)
CARDIAC REHAB PHASE I   PRE:  Rate/Rhythm: 97 SR  BP:  Supine: 84/52  Sitting: 87/63  Standing: 76/54   SaO2:   MODE:  Ambulation: 0 ft   POST:  Rate/Rhythm: 112 ST 0820-0920 Was going to walk with pt but orthostatic and symptomatic. C/o lightheadedness upon standing and HR to 112. Had pt lie back down and I completed MI ed. Reviewed importance of brilinta with stent. Reviewed NTG use, risk factors, MI restrictions, watching sodium and carbs and heart healthy food tips, ex ed and CRP2. Pt has insulin pump. Will refer to CRP 2 [place] but pt does not have insurance at this time. Discussed with pt if she could possibly do a Maintenance program in future. Pt works 50 hours a week so not sure will work out with work schedule. BP 94/68 lying after ed. Had pt stand to take BP and she felt weak so had pt sit back down. Notified nursing staff of low BP. Encouraged pt to stay in bed and drink some water.     Graylon Good, RN BSN  03/21/2017 9:17 AM

## 2017-03-22 ENCOUNTER — Telehealth: Payer: Self-pay | Admitting: *Deleted

## 2017-03-22 DIAGNOSIS — R7881 Bacteremia: Principal | ICD-10-CM

## 2017-03-22 DIAGNOSIS — D649 Anemia, unspecified: Secondary | ICD-10-CM

## 2017-03-22 DIAGNOSIS — R945 Abnormal results of liver function studies: Secondary | ICD-10-CM

## 2017-03-22 LAB — CULTURE, BLOOD (ROUTINE X 2)
CULTURE: NO GROWTH
CULTURE: NO GROWTH
Special Requests: ADEQUATE
Special Requests: ADEQUATE

## 2017-03-22 LAB — GLUCOSE, CAPILLARY
GLUCOSE-CAPILLARY: 149 mg/dL — AB (ref 65–99)
Glucose-Capillary: 177 mg/dL — ABNORMAL HIGH (ref 65–99)

## 2017-03-22 LAB — CBC
HEMATOCRIT: 33.2 % — AB (ref 36.0–46.0)
HEMOGLOBIN: 10.8 g/dL — AB (ref 12.0–15.0)
MCH: 29.8 pg (ref 26.0–34.0)
MCHC: 32.5 g/dL (ref 30.0–36.0)
MCV: 91.7 fL (ref 78.0–100.0)
PLATELETS: 327 10*3/uL (ref 150–400)
RBC: 3.62 MIL/uL — ABNORMAL LOW (ref 3.87–5.11)
RDW: 13.1 % (ref 11.5–15.5)
WBC: 5.5 10*3/uL (ref 4.0–10.5)

## 2017-03-22 LAB — BASIC METABOLIC PANEL
Anion gap: 11 (ref 5–15)
BUN: 8 mg/dL (ref 6–20)
CALCIUM: 9 mg/dL (ref 8.9–10.3)
CO2: 23 mmol/L (ref 22–32)
Chloride: 105 mmol/L (ref 101–111)
Creatinine, Ser: 0.73 mg/dL (ref 0.44–1.00)
Glucose, Bld: 171 mg/dL — ABNORMAL HIGH (ref 65–99)
Potassium: 3.8 mmol/L (ref 3.5–5.1)
SODIUM: 139 mmol/L (ref 135–145)

## 2017-03-22 MED ORDER — LOSARTAN POTASSIUM 25 MG PO TABS: 25.0000 mg | ORAL_TABLET | Freq: Every day | ORAL | 4 refills | Status: DC

## 2017-03-22 MED FILL — NITROSTAT 0.4 MG TABLET SL: 0.4 | 20 days supply | Qty: 25 | Fill #0

## 2017-03-22 MED FILL — BRILINTA 90 MG TABLET: 90 | 30 days supply | Qty: 60 | Fill #0

## 2017-03-22 MED FILL — SULFAMETHOXAZOLE-TMP SS TAB: 400-80 | 5 days supply | Qty: 10 | Fill #0

## 2017-03-22 NOTE — Telephone Encounter (Signed)
Called pt to make hosp f/u appt pt states due to not having insurance she has an appt w/ [facility] on 03/28/17 for hosp f/u. Did not schedule w/Dr. Sharlet Salina.Marland KitchenJohny Chess

## 2017-03-22 NOTE — Telephone Encounter (Signed)
According to patients chart she is still admitted in hospital.

## 2017-03-22 NOTE — Progress Notes (Addendum)
Progress Note  Patient Name: Jennifer Hogan Date of Encounter: 03/22/2017  Primary Cardiologist: Fransico Him, MD   Subjective   No complaints this am  Inpatient Medications    Scheduled Meds: . aspirin EC  81 mg Oral Daily  . atorvastatin  80 mg Oral QHS  . buPROPion  300 mg Oral q morning - 10a  . cyclobenzaprine  10 mg Oral QHS  . FLUoxetine  30 mg Oral Daily  . fluticasone  2 spray Each Nare Daily  . insulin aspart  1,000 Units Subcutaneous Once  . insulin pump   Subcutaneous TID AC, HS, 0200  . levothyroxine  112 mcg Oral QAC breakfast  . montelukast  10 mg Oral QHS  . sodium chloride flush  3 mL Intravenous Q12H  . ticagrelor  90 mg Oral BID   Continuous Infusions: . sodium chloride Stopped (03/21/17 1255)  . meropenem (MERREM) IV Stopped (03/22/17 0540)   PRN Meds: sodium chloride, acetaminophen **OR** acetaminophen, albuterol, meloxicam, morphine injection, nitroGLYCERIN, ondansetron (ZOFRAN) IV, polyvinyl alcohol, sodium chloride flush   Vital Signs    Vitals:   03/21/17 2000 03/21/17 2026 03/22/17 0622 03/22/17 0709  BP:  113/73 (!) 141/77 128/65  Pulse:  92    Resp: 14 13 19 13   Temp:  98.6 F (37 C) 98.3 F (36.8 C)   TempSrc:  Oral Oral   SpO2:  96%    Weight:      Height:        Intake/Output Summary (Last 24 hours) at 03/22/2017 0829 Last data filed at 03/22/2017 0648 Gross per 24 hour  Intake 1319.17 ml  Output 3101 ml  Net -1781.83 ml   Filed Weights   03/19/17 0349 03/20/17 0301 03/21/17 0433  Weight: 160 lb 15 oz (73 kg) 159 lb 2.8 oz (72.2 kg) 158 lb 11.7 oz (72 kg)    Telemetry    NSR - Personally Reviewed  ECG    No new EKG to review - Personally Reviewed  Physical Exam   GEN: No acute distress.   Neck: No JVD Cardiac: RRR, no murmurs, rubs, or gallops.  Respiratory: Clear to auscultation bilaterally. GI: Soft, nontender, non-distended  MS: No edema; No deformity. Neuro:  Nonfocal  Psych: Normal affect   Labs      Chemistry Recent Labs  Lab 03/15/17 1313  03/17/17 1556 03/18/17 0403  03/20/17 0902  03/20/17 1248 03/20/17 1359 03/21/17 0410  NA 134*   < > 138 141   < > 135   < > 136 136 138  K 4.5   < > 3.7 3.3*   < > 4.1   < > 3.8 3.9 3.5  CL 102   < > 98* 106   < > 101   < > 97* 98* 102  CO2 15*   < > 21* 24   < > 21*  --   --  25 23  GLUCOSE 381*   < > 288* 152*   < > 358*   < > 206* 205* 117*  BUN 22*   < > 7 6   < > 10   < > 9 9 9   CREATININE 1.51*   < > 0.83 0.81   < > 0.97   < > 0.60 0.83 0.82  CALCIUM 9.8   < > 8.2* 8.4*   < > 8.8*  --   --  9.0 9.1  PROT 8.4*  --  6.6 5.6*  --   --   --   --   --   --  ALBUMIN 4.7  --  3.5 3.0*  --   --   --   --   --   --   AST 31  --  50* 28  --   --   --   --   --   --   ALT 26  --  22 20  --   --   --   --   --   --   ALKPHOS 77  --  61 51  --   --   --   --   --   --   BILITOT 1.2  --  0.8 0.7  --   --   --   --   --   --   GFRNONAA 40*   < > >60 >60   < > >60  --   --  >60 >60  GFRAA 46*   < > >60 >60   < > >60  --   --  >60 >60  ANIONGAP 17*   < > 19* 11   < > 13  --   --  13 13   < > = values in this interval not displayed.     Hematology Recent Labs  Lab 03/20/17 1359 03/21/17 0410 03/22/17 0721  WBC 5.8 5.5 5.5  RBC 3.74* 3.35* 3.62*  HGB 11.6* 10.4* 10.8*  HCT 34.3* 30.8* 33.2*  MCV 91.7 91.9 91.7  MCH 31.0 31.0 29.8  MCHC 33.8 33.8 32.5  RDW 12.8 13.3 13.1  PLT 342 333 327    Cardiac Enzymes Recent Labs  Lab 03/18/17 0823 03/18/17 1628 03/18/17 2221 03/19/17 0522  TROPONINI 1.24* 1.22* 1.19* 0.91*   No results for input(s): TROPIPOC in the last 168 hours.   BNPNo results for input(s): BNP, PROBNP in the last 168 hours.   DDimer No results for input(s): DDIMER in the last 168 hours.   Radiology    No results found.  Cardiac Studies   Echo 03/16/17 Study Conclusions - Left ventricle: The cavity size was normal. Systolic function was mildly reduced. The estimated ejection fraction was in the  range of 45% to 50%. Hypokinesis of the apical myocardium. Features are consistent with a pseudonormal left ventricular filling pattern, with concomitant abnormal relaxation and increased filling pressure (grade 2 diastolic dysfunction). - Pulmonary arteries: Systolic pressure was mildly increased. PA peak pressure: 37 mm Hg (S). Impressions: - Appearnce suggests &quot;apical ballooning&quot;. This could representischemia due to stenosis in a &quot;wrap-around&quot; LAD artery or stress cardiomyopathy (takotsubo syndrome).  Cardiac cath: 03/20/2017  Prox Cx lesion is 40% stenosed.  Mid Cx lesion is 40% stenosed.  Ost 1st Mrg to 1st Mrg lesion is 80% stenosed.  Dist LM to Ost LAD lesion is 70% stenosed.  Prox LAD lesion is 70% stenosed.  Mid LAD-1 lesion is 95% stenosed.  Mid LAD-2 lesion is 95% stenosed.  Dist LAD lesion is 99% stenosed.  A drug-eluting stent was successfully placed using a STENT SYNERGY DES 2.5X16.  Post intervention, there is a 0% residual stenosis.  A drug-eluting stent was successfully placed using a STENT SYNERGY DES 3X12.  Post intervention, there is a 0% residual stenosis.  A drug-eluting stent was successfully placed using a STENT SYNERGY DES 2.75X16.  Post intervention, there is a 0% residual stenosis.  A drug-eluting stent was successfully placed using a STENT SYNERGY DES 2.25X38.  Post intervention, there is a 0% residual stenosis.  Post intervention, there is a 0% residual stenosis. 1.  Severe double vessel CAD. 2. Severe stenosis in the ostial LAD, mid LAD and distal LAD. PTCA/DES x 3 in the LAD (ostial DES placed, mid DES placed, distal DES placed).  3. Severe stenosis intermediate branch. Successful PTCA/DES x 1 intermediate branch.  4. Moderate non-obstructive disease in the proximal and mid Circumflex.  5. Severe stenosis in the apical LAD. Due to the distal location, no PCI performed.  Recommendations: Continue DAPT  with ASA and Brilinta for one year. Continue statin and beta blocker.  Post-Intervention Diagram            Patient Profile     47 y.o. female with a hx of type I diabetes, hyperlipidemia, hypothyroidism and family history of CADwho is being seen today for the evaluation of abnormal echocardiogram and EKGat the request of [place].    Assessment & Plan    1. NSTEMI: -Initial trop this admission, 1.24>1.22>1.19>0.91 -EKG with T-wave inversion in II, II, III, avF, V4-V6, new  -Echo with decreased EF to 45-50% with hypokinesis - s/p cath w/ severe 2 v dz>>DES x 4 -continue ASA, Brilinta 90mg  BID, high dose statin and prn SL NTG -She has been up walking with cardiac rehab and is doing well.  2. Bacteremia: -Empiric IV Abx given + BC's gram negative rods -Afebrile -WBC 5.9 - Per IM  3. Chronic systolic heart failure/ICM: -Echo 03/16/17 with decreased Ef to 45-50% with WMA, hypokinesis -No s/s of fluid volume overloaddaily -She had hypotension with addition of carvedilol and Imdur yesterday.  All medicines were placed on hold for heart failure. -BP much improved today but still orthostatic so will hold starting Losartan - we reassess as an outpt  4. Type I DM: -Per primary, insulin pump  5. Anemia: -CBC, Hb, 11.6 on 02/26  - per IM  6. HLD: -Lipid panel, CHO-197, HDL-56, LDL-126, Trig-74 -changed to high dose statin -She will need a fasting lipid panel and ALT in 6 weeks  7. Hypokalemia: - Repleted and now 3.5   Patient is doing well from a cardiac standpoint and is stable for discharge home.  We will get her set up with cardiac rehab As well as transition of care office visit in our office.   For questions or updates, please contact [place] Please consult [email or web address] for contact info under Cardiology/STEMI.      Signed, Fransico Him, MD  03/22/2017, 8:29 AM

## 2017-03-22 NOTE — Discharge Summary (Signed)
Physician Discharge Summary   Patient ID: JESSECA MARSCH MRN: [id number] DOB/AGE: 28-Feb-1970 47 y.o.  Admit date: 03/17/2017 Discharge date: 03/22/2017  Primary Care Physician:  Hoyt Koch, MD   Recommendations for Outpatient Follow-up:  1. Follow up with PCP in 1-2 weeks 2. Please obtain BMP/CBC in one week, check LFTs in 6 weeks, patient started on high-dose statin  Home Health: Cardiac rehabilitation per case management Equipment/Devices: None  Discharge Condition: stable  CODE STATUS: FULL  Diet recommendation: Carb modified diet   Discharge Diagnoses:    Chronic systolic CHF NSTEMI .Achromobacter Denitrificans Bacteremia . Hypothyroidism . Type 1 diabetes mellitus ([place]) Depression   Consults: Cardiology    Allergies:   Allergies  Allergen Reactions  . Ciprofloxin Hcl [Ciprofloxacin] Nausea And Vomiting and Other (See Comments)    Severe migraine  . Food Color Red [Red Dye] Diarrhea     DISCHARGE MEDICATIONS: Allergies as of 03/22/2017      Reactions   Ciprofloxin Hcl [ciprofloxacin] Nausea And Vomiting, Other (See Comments)   Severe migraine   Food Color Red [red Dye] Diarrhea      Medication List    TAKE these medications   albuterol 108 (90 Base) MCG/ACT inhaler Commonly known as:  PROVENTIL HFA;VENTOLIN HFA Inhale 2 puffs into the lungs every 6 (six) hours as needed for wheezing or shortness of breath. Notes to patient:  As needed for shortness of breath or wheezing    ALPHA LIPOIC ACID PO Take 1 tablet by mouth daily. Notes to patient:  Supplement    aspirin 81 MG EC tablet Take 1 tablet (81 mg total) by mouth daily. Notes to patient:  Prevents clotting in the stent and heart attack   atorvastatin 80 MG tablet Commonly known as:  LIPITOR Take 1 tablet (80 mg total) by mouth at bedtime. What changed:    medication strength  how much to take  when to take this Notes to patient:  Lowers cholesterol    b complex  vitamins tablet Take 1 tablet by mouth daily. Notes to patient:  Supplement    buPROPion 300 MG 24 hr tablet Commonly known as:  WELLBUTRIN XL TAKE ONE TABLET BY MOUTH IN THE MORNING Notes to patient:  Depression    cyclobenzaprine 10 MG tablet Commonly known as:  FLEXERIL Take 1 tablet (10 mg total) by mouth at bedtime. Notes to patient:  Muscle relaxant   ENLITE GLUCOSE SENSOR Misc 1 Device by Does not apply route once a week.   FLUoxetine 10 MG tablet Commonly known as:  PROZAC Take 3 tablets (30 mg total) by mouth daily. Notes to patient:  Depression    glucose blood test strip Commonly known as:  BAYER CONTOUR NEXT TEST Test blood glucose 2-3 times a day. Dx code: 250.01 Notes to patient:  Testing supplies   insulin lispro 100 UNIT/ML injection Commonly known as:  HUMALOG 35 units per insulin pump daily. Notes to patient:  Controls blood sugar    levothyroxine 112 MCG tablet Commonly known as:  SYNTHROID, LEVOTHROID Take 1 tablet (112 mcg total) by mouth daily before breakfast. Notes to patient:  Thyroid     LUBRICATING EYE DROPS OP Place 1 drop into both eyes daily as needed (dry eyes). Notes to patient:  As needed for dry eyes    meloxicam 15 MG tablet Commonly known as:  MOBIC Take 0.5-1 tablets (7.5-15 mg total) by mouth at bedtime as needed for pain. Notes to patient:  As needed for  pain att bedtime    MINIMED INFUSION SET-MMT 23 Misc See admin instructions. Use with Humulin insulin - continuous - basal rate 0.625 Notes to patient:  Blood sugar control    mometasone 50 MCG/ACT nasal spray Commonly known as:  NASONEX Place 2 sprays into the nose daily as needed (seasonal allergies). Notes to patient:  As needed for allergies   montelukast 10 MG tablet Commonly known as:  SINGULAIR Take 1 tablet (10 mg total) by mouth at bedtime. Notes to patient:  Treats allergies Prevents asthma   nitroGLYCERIN 0.4 MG SL tablet Commonly known as:   NITROSTAT Place 1 tablet (0.4 mg total) under the tongue every 5 (five) minutes as needed for chest pain. Notes to patient:  As needed for chest pain    sulfamethoxazole-trimethoprim 400-80 MG tablet Commonly known as:  BACTRIM Take 1 tablet by mouth 2 (two) times daily. X 5 days Notes to patient:  Antibiotic for bacteremia    ticagrelor 90 MG Tabs tablet Commonly known as:  BRILINTA Take 1 tablet (90 mg total) by mouth 2 (two) times daily. Notes to patient:  Prevents clotting in the stent and heart attack        Brief H and P: For complete details please refer to admission H and P, but in brief Patient is47 year old female with type 1 diabetes mellitus on chronic insulin pump, hypothyroidism, was recently admitted to the hospital in the setting of a GI illness with nausea vomiting diarrhea, felt to be dehydrated, received IV fluids and was discharged home. During that admission underwent blood cultures that eventually speciated gram-negative rods, patient was called to come back. She also underwent an echo which showed an depressed EF of 45-50%.   Hospital Course:  Bacteremia, gram-negative on cultures on 2/21 -Most likely related to her transient GI illness.  Initial blood cultures organism identified as Achromobacter Denitrificans, patient was placed on IV meropenem -Repeat blood cultures on 2/23 negative so far - Dr Cruzita Lederer discussed with ID, recommended carbopenem while inpatient and can be transitioned to Bactrim at discharge, recommended 5-7-day total course.  Chronic systolic CHF/ NSTEMI -2D echo 2/22 showed EF of 45-50% with hypokinesis of apical myocardium -Cardiology was consulted, underwent cardiac cath which showed severe triple-vessel CAD, received stent x4, PTCA/DES x 3 in the LAD (ostial DES placed, mid DES placed, distal DES placed). PTCA/DES x 1 intermediate branch.  -Continue dual antiplatelet agents, aspirin and Brilinta, statin - Patient continued to have  hypotension with addition of Coreg, Imdur. She was orthostatic hence all the antihypertensives including losartan will be held per cardiology recommendations. This will be assessed outpatient.    Type 1 diabetes mellitus (HCC) -Continue insulin pump, CBGs fairly controlled  Depression -Currently stable, continue Wellbutrin, Prozac   Hypothyroidism - continue Synthroid    Day of Discharge S: Doing well, BP improving, still somewhat orthostatic, wants to go home  BP 106/63   Pulse 88   Temp 97.6 F (36.4 C) (Oral)   Resp 18   Ht 5\' 5"  (1.651 m)   Wt 72 kg (158 lb 11.7 oz)   LMP 03/12/2017   SpO2 100%   BMI 26.41 kg/m   Physical Exam: General: Alert and awake oriented x3 not in any acute distress. HEENT: anicteric sclera, pupils reactive to light and accommodation CVS: S1-S2 clear no murmur rubs or gallops Chest: clear to auscultation bilaterally, no wheezing rales or rhonchi Abdomen: soft nontender, nondistended, normal bowel sounds Extremities: no cyanosis, clubbing or edema noted bilaterally  Neuro: Cranial nerves II-XII intact, no focal neurological deficits   The results of significant diagnostics from this hospitalization (including imaging, microbiology, ancillary and laboratory) are listed below for reference.      Procedures/Studies:  Conclusion     Prox Cx lesion is 40% stenosed.  Mid Cx lesion is 40% stenosed.  Ost 1st Mrg to 1st Mrg lesion is 80% stenosed.  Dist LM to Ost LAD lesion is 70% stenosed.  Prox LAD lesion is 70% stenosed.  Mid LAD-1 lesion is 95% stenosed.  Mid LAD-2 lesion is 95% stenosed.  Dist LAD lesion is 99% stenosed.  A drug-eluting stent was successfully placed using a STENT SYNERGY DES 2.5X16.  Post intervention, there is a 0% residual stenosis.  A drug-eluting stent was successfully placed using a STENT SYNERGY DES 3X12.  Post intervention, there is a 0% residual stenosis.  A drug-eluting stent was successfully  placed using a STENT SYNERGY DES 2.75X16.  Post intervention, there is a 0% residual stenosis.  A drug-eluting stent was successfully placed using a STENT SYNERGY DES 2.25X38.  Post intervention, there is a 0% residual stenosis.  Post intervention, there is a 0% residual stenosis.   1. Severe double vessel CAD. 2. Severe stenosis in the ostial LAD, mid LAD and distal LAD. PTCA/DES x 3 in the LAD (ostial DES placed, mid DES placed, distal DES placed).  3. Severe stenosis intermediate branch. Successful PTCA/DES x 1 intermediate branch.  4. Moderate non-obstructive disease in the proximal and mid Circumflex.  5. Severe stenosis in the apical LAD. Due to the distal location, no PCI performed.   Recommendations: Continue DAPT with ASA and Brilinta for one year. Continue statin and beta blocker.        Ct Coronary Morph W/cta Cor W/score W/ca W/cm &/or Wo/cm  Addendum Date: 03/19/2017   ADDENDUM REPORT: 03/19/2017 17:42 CLINICAL DATA:  47 year old female with atypical chest pain, bacteremia, elevated troponin and an echocardiogram suspicious for a stress induced cardiomyopathy. EXAM: Cardiac/Coronary  CT TECHNIQUE: The patient was scanned on a [employer]. FINDINGS: A 120 kV prospective scan was triggered in the descending thoracic aorta at 111 HU's. Axial non-contrast 3 mm slices were carried out through the heart. The data set was analyzed on a dedicated work station and scored using the [employer]. Gantry rotation speed was 250 msecs and collimation was .6 mm. No beta blockade and 0.8 mg of sl NTG was given. The 3D data set was reconstructed in 5% intervals of the 67-82 % of the R-R cycle. Diastolic phases were analyzed on a dedicated work station using MPR, MIP and VRT modes. The patient received 80 cc of contrast. Aorta:  Normal size.  No calcifications.  No dissection. Aortic Valve:  Trileaflet.  No calcifications. Coronary Arteries:  Normal coronary origin.  Right dominance.  RCA is a large dominant artery that gives rise to PDA and a small PLA. There is no plaque. Left main is a large artery that gives rise to LAD and LCX arteries. There is no plaque. LAD is a large vessel that gives rise to one large diagonal artery. Proximal LAD is affected by motion however there appears to be a moderate non-calcified plaque with associated with 50-69% stenosis but possibly > 70 stenosis. Mid and distal LAD have another mild noncalcified plaques with associated stenoses 25-50%. D1 is fairly large and has no significant plaque. LCX is a small non-dominant artery. Other findings: Normal pulmonary vein drainage into the left atrium. Normal let  atrial appendage without a thrombus. Normal size of the pulmonary artery. IMPRESSION: 1. Coronary calcium score of 0. This was 0 percentile for age and sex matched control. 2. Normal coronary origin with right dominance. 3. Proximal LAD is affected by motion however there appears to be at least moderate non-calcified plaque. A cardiac catheterization is recommended. Electronically Signed   By: [name]   On: 03/19/2017 17:42   Result Date: 03/19/2017 EXAM: OVER-READ INTERPRETATION  CT CHEST The following report is an over-read performed by radiologist Dr. Aletta Edouard of [facility], [place] on 03/19/2017. This over-read does not include interpretation of cardiac or coronary anatomy or pathology. The coronary calcium score/coronary CTA interpretation by the cardiologist is attached. COMPARISON:  None. FINDINGS: Vascular: Central pulmonary arteries are normal in caliber. No evidence of aneurysmal disease of the visualized thoracic aorta or proximal abdominal aorta. Mediastinum/Nodes: No enlarged lymph nodes identified. No evidence of hiatal hernia. Lungs/Pleura: Visualized lungs show some mild scarring/atelectasis at both lung bases. No edema, nodule or pleural fluid identified. Upper Abdomen: No acute abnormality. Musculoskeletal: No chest wall mass  or suspicious bone lesions identified. IMPRESSION: No significant noncardiac findings in the visualized portion of the chest. Electronically Signed: By: Aletta Edouard M.D. On: 03/19/2017 15:24   Dg Chest Portable 1 View  Result Date: 03/15/2017 CLINICAL DATA:  Fever, abdominal pain, and nausea and vomiting for 2 days. EXAM: PORTABLE CHEST 1 VIEW COMPARISON:  None. FINDINGS: The cardiomediastinal silhouette is within normal limits. The lungs are well inflated and clear. There is no evidence of pleural effusion or pneumothorax. No acute osseous abnormality is identified. IMPRESSION: No active disease. Electronically Signed   By: Logan Bores M.D.   On: 03/15/2017 17:09   Ct Coronary Fractional Flow Reserve Fluid Analysis  Result Date: 03/20/2017 EXAM: FF/RCT ANALYSIS FINDINGS: FFRct analysis was performed on the original cardiac CT angiogram dataset. Diagrammatic representation of the FFRct analysis is provided in a separate PDF document in PACS. This dictation was created using the PDF document and an interactive 3D model of the results. 3D model is not available in the EMR/PACS. Normal FFR range is >0.80. 1. Left Main:  No significant stenosis 2. LAD: Proximal CT FFR: 0.86, mid: 0.75, distal: 0.67. 3. D1: CT FFR: 0.85. 4. LCX: Proximal CT FFR: 0.86, distal: 0.81. 5. RCA: No significant stenosis. IMPRESSION: 1. CT FFR analysis showed hemodynamically significant stenosis in the proximal and mid LAD, a cardiac catheterization is recommended. Electronically Signed   By: [name]   On: 03/20/2017 12:05       LAB RESULTS: Basic Metabolic Panel: Recent Labs  Lab 03/21/17 0410 03/22/17 0721  NA 138 139  K 3.5 3.8  CL 102 105  CO2 23 23  GLUCOSE 117* 171*  BUN 9 8  CREATININE 0.82 0.73  CALCIUM 9.1 9.0   Liver Function Tests: Recent Labs  Lab 03/17/17 1556 03/18/17 0403  AST 50* 28  ALT 22 20  ALKPHOS 61 51  BILITOT 0.8 0.7  PROT 6.6 5.6*  ALBUMIN 3.5 3.0*   Recent Labs   Lab 03/15/17 1313  LIPASE 19   No results for input(s): AMMONIA in the last 168 hours. CBC: Recent Labs  Lab 03/17/17 1556  03/21/17 0410 03/22/17 0721  WBC 5.8   < > 5.5 5.5  NEUTROABS 2.8  --   --   --   HGB 11.4*   < > 10.4* 10.8*  HCT 34.3*   < > 30.8* 33.2*  MCV 92.0   < > 91.9 91.7  PLT 420*   < > 333 327   < > = values in this interval not displayed.   Cardiac Enzymes: Recent Labs  Lab 03/18/17 2221 03/19/17 0522  TROPONINI 1.19* 0.91*   BNP: Invalid input(s): POCBNP CBG: Recent Labs  Lab 03/22/17 0230 03/22/17 0647  GLUCAP 177* 149*      Disposition and Follow-up: Discharge Instructions    Amb Referral to Cardiac Rehabilitation   Complete by:  As directed    Diagnosis:   NSTEMI Coronary Stents     Diet - low sodium heart healthy   Complete by:  As directed    Increase activity slowly   Complete by:  As directed        DISPOSITION: home    [facility] Follow up on 03/28/2017.   Why:  9:00 am for hospital follow up Contact information: [place] [phone]       [name], MD Follow up.   Specialty:  Cardiology Why:  The office will call. Contact information: [address] [place] [address] [phone]            Time coordinating discharge:   22mins   Signed:   Estill Cotta M.D. [facility] 03/22/2017, 9:59 AM Pager: [phone]

## 2017-03-22 NOTE — Progress Notes (Signed)
CARDIAC REHAB PHASE I   PRE:  Rate/Rhythm: Sinus Rhythm 95  BP:  Supine: 128/74  Sitting: 105/53  Standing 84/54   SaO2: 98% Room Air  Recheck standing BP 97/67  MODE:  Ambulation: 800 ft   POST:  Rate/Rhythem: 113  BP:    Sitting: 106/63     SaO2: 100% Room Air  [phone] Orthostatic  Hypotension noted initially. Patient asymptomatic. Rhonda Barrett PAC notified. Patient given water recheck BP improved. Patient ambulated 200 feet in the hallway. Recheck standing blood pressure 103/71. Athalee proceeded to walk another 600 feet in the hallway independently. Gordon reported having a "pasty mouth" and felt a little wobbly but denied feeling dizzy. Patient tolerated her ambulation without further complaints and was assisted back to bed with call light within reach.  Harrell Gave RN BSN

## 2017-03-24 ENCOUNTER — Telehealth: Payer: Self-pay | Admitting: Cardiology

## 2017-03-24 NOTE — Telephone Encounter (Signed)
Jennifer Hogan is a 47 year old woman who was recently discharged on 2/28 with new diagnosis of cardiomyopathy. During her hospitalization, she underwent LHC via R radial artery and was found to have severe two-vessel CAD, is s/p PCI with DES x4. She calls this evening as she believes her ring and pinky finger on her right hand are colder to the touch as compared to her left hand, they also feel tingly. She reports this began approximately 45 minutes before the phone call. She denies color change, pain, difficulty using the hand, numbness in other fingers, increased bruising or firmness at the access site. She checked her pulse and notes that it is present. She is not sure whether her fingers still feel cool as compared to her left hand or if this is improved now. She also notes that her blood glucose is low and she drank a drink to try to bring it up, is unsure whether that could be causing some of her symptoms.   Overall, based on our conversation relatively reassuring but discussed with the patient that we are happy to examine her if she would like to be seen tonight. Otherwise, will try to move her clinic appointment (currently scheduled for Thursday) to a day earlier in the week. Advised to come to the ED if symptoms worsen, if the fingers change color or become cold again, if she develops swelling or firmness of the forearm. She noted understanding and agreed with the plan.

## 2017-03-26 NOTE — Telephone Encounter (Deleted)
Follow up      Pt c/o swelling: STAT is pt has developed SOB within 24 hours  1) How much weight have you gained and in what time span? 180-185   2) If swelling, where is the swelling located?  Left leg and stomach  3) Are you currently taking a fluid pill? yes  4) Are you currently SOB?yes  5) Do you have a log of your daily weights (if so, list)?  no  6) Have you gained 3 pounds in a day or 5 pounds in a week? 180-185 between a week   7) Have you traveled recently? no

## 2017-03-26 NOTE — Telephone Encounter (Signed)
**Note De-identified  Obfuscation** LMTCB

## 2017-03-26 NOTE — Telephone Encounter (Signed)
Error

## 2017-03-27 NOTE — Telephone Encounter (Signed)
**Note De-identified  Obfuscation** LMTCB

## 2017-03-28 ENCOUNTER — Encounter: Payer: Self-pay | Admitting: Endocrinology

## 2017-03-28 ENCOUNTER — Encounter: Payer: Self-pay | Admitting: Critical Care Medicine

## 2017-03-28 ENCOUNTER — Telehealth (HOSPITAL_COMMUNITY): Payer: Self-pay

## 2017-03-28 ENCOUNTER — Ambulatory Visit (INDEPENDENT_AMBULATORY_CARE_PROVIDER_SITE_OTHER): Payer: Self-pay | Admitting: Endocrinology

## 2017-03-28 ENCOUNTER — Other Ambulatory Visit: Payer: Self-pay

## 2017-03-28 ENCOUNTER — Ambulatory Visit: Payer: Self-pay | Attending: Critical Care Medicine | Admitting: Critical Care Medicine

## 2017-03-28 VITALS — Temp 98.5°F | Resp 20

## 2017-03-28 VITALS — BP 112/70 | HR 94 | Wt 154.8 lb

## 2017-03-28 DIAGNOSIS — E1042 Type 1 diabetes mellitus with diabetic polyneuropathy: Secondary | ICD-10-CM

## 2017-03-28 DIAGNOSIS — R197 Diarrhea, unspecified: Secondary | ICD-10-CM

## 2017-03-28 DIAGNOSIS — K3184 Gastroparesis: Secondary | ICD-10-CM | POA: Insufficient documentation

## 2017-03-28 DIAGNOSIS — I5022 Chronic systolic (congestive) heart failure: Secondary | ICD-10-CM | POA: Insufficient documentation

## 2017-03-28 DIAGNOSIS — E1143 Type 2 diabetes mellitus with diabetic autonomic (poly)neuropathy: Secondary | ICD-10-CM

## 2017-03-28 DIAGNOSIS — R112 Nausea with vomiting, unspecified: Secondary | ICD-10-CM

## 2017-03-28 DIAGNOSIS — F3341 Major depressive disorder, recurrent, in partial remission: Secondary | ICD-10-CM | POA: Insufficient documentation

## 2017-03-28 DIAGNOSIS — E1043 Type 1 diabetes mellitus with diabetic autonomic (poly)neuropathy: Secondary | ICD-10-CM | POA: Insufficient documentation

## 2017-03-28 DIAGNOSIS — I214 Non-ST elevation (NSTEMI) myocardial infarction: Secondary | ICD-10-CM | POA: Insufficient documentation

## 2017-03-28 DIAGNOSIS — R7881 Bacteremia: Secondary | ICD-10-CM | POA: Insufficient documentation

## 2017-03-28 DIAGNOSIS — I2510 Atherosclerotic heart disease of native coronary artery without angina pectoris: Secondary | ICD-10-CM

## 2017-03-28 DIAGNOSIS — I255 Ischemic cardiomyopathy: Secondary | ICD-10-CM | POA: Insufficient documentation

## 2017-03-28 HISTORY — DX: Atherosclerotic heart disease of native coronary artery without angina pectoris: I25.10

## 2017-03-28 LAB — POCT GLYCOSYLATED HEMOGLOBIN (HGB A1C): Hemoglobin A1C: 7.9

## 2017-03-28 NOTE — Patient Instructions (Signed)
Get brilinta and atorvastin filled here No change in medications No more antibiotics See Dr Loanne Drilling today  Keep cardiology appointment Will work with you on orange card and help with paperwork on [place] today We will get you an orange card application started We will obtain a primary care visit here

## 2017-03-28 NOTE — Assessment & Plan Note (Signed)
Recent NSTMI with ischemic CM Plan Cont ASA, brilenta. Hold long acting nitrate and B Blocker F/u cardiology

## 2017-03-28 NOTE — Assessment & Plan Note (Signed)
LAD complex lesion with three drug eluting stents, branch vessel with DES as well Per cardiology

## 2017-03-28 NOTE — Assessment & Plan Note (Signed)
DM gastroparesis  Per endocrine

## 2017-03-28 NOTE — Progress Notes (Signed)
Subjective:    [name], female    DOB: Nov 23, 1970, 47 y.o.   MRN: [id number]  HPI Pt seen in hospital 2/23 - 4/01  Chronic systolic CHF NSTEMI .Achromobacter Denitrificans Bacteremia . Hypothyroidism . Type 1 diabetes mellitus [facility]) Depression  Hospital Course:  Bacteremia,gram-negative on cultures on 2/21 -Most likely related to her transient GI illness. Initial blood cultures organism identified as Achromobacter Denitrificans, patient was placed on IV meropenem -Repeat blood cultures on 2/23negativeso far - Dr Gherghediscussed with ID, recommended carbopenem while inpatient and can be transitioned to [facility] discharge, recommended 5-7-day total course.  Chronic systolic CHF/NSTEMI -2D echo 2/22 showed EF of 45-50% with hypokinesis of apical myocardium -Cardiology was consulted, underwent cardiac cath which showed severe triple-vessel CAD, received stentx4,PTCA/DES x 3 in the LAD (ostial DES placed, mid DES placed, distal DES placed). PTCA/DES x 1 intermediate branch. -Continue dual antiplatelet agents, aspirin and Brilinta, statin - Patient continued to have hypotension with addition of Coreg, Imdur. She was orthostatic hence all the antihypertensives including losartan will be held per cardiology recommendations. This will be assessed outpatient.  Type 1 diabetes mellitus (HCC) -Continue insulin pump, CBGs fairly controlled  Depression -Currently stable, continue Wellbutrin, Prozac   Hypothyroidism -continue Synthroid     From Cath and DES    Prox Cx lesion is 40% stenosed.  Mid Cx lesion is 40% stenosed.  Ost 1st Mrg to 1st Mrg lesion is 80% stenosed.  Dist LM to Ost LAD lesion is 70% stenosed.  Prox LAD lesion is 70% stenosed.  Mid LAD-1 lesion is 95% stenosed.  Mid LAD-2 lesion is 95% stenosed.  Dist LAD lesion is 99% stenosed.  A drug-eluting stent was successfully placed using a STENT SYNERGY DES  2.5X16.  Post intervention, there is a 0% residual stenosis.  A drug-eluting stent was successfully placed using a STENT SYNERGY DES 3X12.  Post intervention, there is a 0% residual stenosis.  A drug-eluting stent was successfully placed using a STENT SYNERGY DES 2.75X16.  Post intervention, there is a 0% residual stenosis.  A drug-eluting stent was successfully placed using a STENT SYNERGY DES 2.25X38.  Post intervention, there is a 0% residual stenosis.  Post intervention, there is a 0% residual stenosis.  1. Severe double vessel CAD. 2. Severe stenosis in the ostial LAD, mid LAD and distal LAD. PTCA/DES x 3 in the LAD (ostial DES placed, mid DES placed, distal DES placed).  3. Severe stenosis intermediate branch. Successful PTCA/DES x 1 intermediate branch.  4. Moderate non-obstructive disease in the proximal and mid Circumflex.  5. Severe stenosis in the apical LAD. Due to the distal location, no PCI performed.   Recommendations: Continue DAPT with ASA and Brilinta for one year. Continue statin and beta blocker.     Pt without insurance since early 2018  On Insulin pump  0.625   Correction factor 20.   CBGs 116  Now 202. Was 119 before   Can get as high as 250.    No fever, Pt was better, was very active with horses, laundry.  Today is worse. Pt did not eat this am.  Not much food in the afternoon.  Pt with emesis and bloating.    Pt had nausea as well.  Not eating now No real abd pain   Endocrine is Loanne Drilling.     Hx  Asthma using SABA prn  Allergies to dust.  No issues in the horse barn Has a dusty dirty house  Review of Systems  Constitutional: Positive for activity change, appetite change and fatigue. Negative for diaphoresis and fever.  HENT: Positive for congestion. Negative for postnasal drip, rhinorrhea, sinus pain, sore throat and trouble swallowing.   Eyes: Positive for visual disturbance.  Respiratory: Positive for chest tightness. Negative for  cough, shortness of breath, wheezing and stridor.   Cardiovascular: Negative for chest pain, palpitations and leg swelling.       Felt pressure over rib cage area.  Gastrointestinal: Positive for nausea and vomiting. Negative for abdominal pain, constipation and diarrhea.  Endocrine: Positive for cold intolerance and polydipsia. Negative for heat intolerance and polyuria.  Genitourinary: Negative for difficulty urinating, dysuria, frequency, hematuria, pelvic pain and urgency.  Musculoskeletal: Negative for arthralgias, joint swelling and myalgias.  Skin: Negative for rash.  Neurological: Positive for dizziness, facial asymmetry, weakness and light-headedness. Negative for seizures and syncope.  Hematological: Negative.   Psychiatric/Behavioral: Negative.        Objective:   Physical Exam Vitals:   03/28/17 0851  Resp: 20  SpO2: 100%    Gen: Pleasant, well-nourished, in no distress,  normal affect  ENT: No lesions,  mouth clear,  oropharynx clear, no postnasal drip  Neck: No JVD, no TMG, no carotid bruits  Lungs: No use of accessory muscles, no dullness to percussion, clear without rales or rhonchi  Cardiovascular: RRR, heart sounds normal, no murmur or gallops, no peripheral edema  Abdomen: soft and NT, no HSM,  BS normal  Musculoskeletal: No deformities, no cyanosis or clubbing  Neuro: alert, non focal  Skin: Warm, no lesions or rashes  No results found.  BMP Latest Ref Rng & Units 03/22/2017 03/21/2017 03/20/2017  Glucose 65 - 99 mg/dL 171(H) 117(H) 205(H)  BUN 6 - 20 mg/dL 8 9 9   Creatinine 0.44 - 1.00 mg/dL 0.73 0.82 0.83  Sodium 135 - 145 mmol/L 139 138 136  Potassium 3.5 - 5.1 mmol/L 3.8 3.5 3.9  Chloride 101 - 111 mmol/L 105 102 98(L)  CO2 22 - 32 mmol/L 23 23 25   Calcium 8.9 - 10.3 mg/dL 9.0 9.1 9.0   CBG 202.          Assessment & Plan:  I personally reviewed all images and lab data in the [facility] system as well as any outside material available during  this office visit and agree with the  radiology impressions.   Type 1 diabetes mellitus (HCC) Type 1 DM on insulin pump.  Poor dietary intake , probable DM gastroparesis.   Plan Dr Loanne Drilling graciously agreed to see the patient today   Gastroparesis due to DM [facility]) DM gastroparesis  Per endocrine  Nausea vomiting and diarrhea Recent Viral gastroenteritis Now with gastroparesis  Ischemic cardiomyopathy Recent NSTMI with ischemic CM Plan Cont ASA, brilenta. Hold long acting nitrate and B Blocker F/u cardiology  Coronary artery disease LAD complex lesion with three drug eluting stents, branch vessel with DES as well Per cardiology  Bacteremia Bacteremia d/t achromobacter, now resolved Completed abx course D/t bacterial gut translocation    Jennifer Hogan was seen today for hospitalization follow-up.  Diagnoses and all orders for this visit:  Bacteremia -     Comprehensive metabolic panel -     CBC with Differential/Platelet; Future -     CBC with Differential/Platelet  Type 1 diabetes mellitus with diabetic polyneuropathy (HCC) -     Glucose (CBG) -     Comprehensive metabolic panel  NSTEMI (non-ST elevated myocardial infarction) (HCC)  Recurrent major depressive disorder,  in partial remission ([place])  Gastroparesis due to DM [facility])  Coronary artery disease involving native coronary artery of native heart without angina pectoris  Ischemic cardiomyopathy  Nausea vomiting and diarrhea

## 2017-03-28 NOTE — Assessment & Plan Note (Signed)
Bacteremia d/t achromobacter, now resolved Completed abx course D/t bacterial gut translocation

## 2017-03-28 NOTE — Patient Instructions (Addendum)
check your blood sugar 8 times a day.  vary the time of day when you check, between before the 3 meals, and at bedtime.  also check if you have symptoms of your blood sugar being too high or too low.  please keep a record of the readings and bring it to your next appointment here.  please call us sooner if your blood sugar goes below 70, or if you have a lot of readings over 200.   Please take these pump settings:  basal rate of 0.65 units/hr, 24 HRS per day.  mealtime bolus of 1 unit/18 grams of carbohydrate.  correction bolus (which some people call "sensitivity," or "insulin sensitivity ratio," or just "isr") of 1 unit for each 60 by which your glucose exceeds 100.   Please come back for a follow-up appointment in 2 months.

## 2017-03-28 NOTE — Assessment & Plan Note (Signed)
Type 1 DM on insulin pump.  Poor dietary intake , probable DM gastroparesis.   Plan Dr Loanne Drilling graciously agreed to see the patient today

## 2017-03-28 NOTE — Telephone Encounter (Signed)
Attempted to call patient in regards to Insurance - lm on vm

## 2017-03-28 NOTE — Progress Notes (Signed)
Pt here f/u hospital visit: Bacteremia Pt became light headed when coming back in the room .   CBG- 202

## 2017-03-28 NOTE — Assessment & Plan Note (Signed)
Recent Viral gastroenteritis Now with gastroparesis

## 2017-03-28 NOTE — Progress Notes (Signed)
Subjective:    [name], female    DOB: 01/29/1970, 47 y.o.   MRN: [id number]  HPI Pt returns for f/u of diabetes mellitus:  DM type: 1  Dx'ed: 1540 Complications: CAD Therapy: insulin since dx.  GDM: never DKA: never Severe hypoglycemia: never.  Pancreatitis: never Other: she has been on pump (medtronic) rx since 2014; she stopped continuous glucose monitor, due to cost.   Interval history: She has lost her health insurance.  She takes these settings: 3 basal rates varying from 0.625-0.65 units/hr, 24 HRS per day.  mealtime bolus of 1 unit/20 grams of carbohydrate.  correction bolus (which some people call "sensitivity," or "insulin sensitivity ratio," or just "isr") of 1 unit for each 60 by which your glucose exceeds 100.   TDD is approx 30 units/day.  no cbg record, but states cbg's are mildly low 3-4 times per week, usually in the afternoon.  There is otherwise no trend throughout the day.   Past Medical History:  Diagnosis Date  . Acute sinusitis 08/07/2015  . Allergic state 08/07/2015  . Anxiety   . Depression   . Diabetes mellitus   . High cholesterol   . Hypothyroidism   . NSTEMI (non-ST elevated myocardial infarction) ([place]) 03/17/2017   s/p DES x 3 LAD, DES OM    Past Surgical History:  Procedure Laterality Date  . APPENDECTOMY    . CORONARY STENT INTERVENTION N/A 03/20/2017   Procedure: DES x 3 LAD, DES OM; Surgeon: Burnell Blanks, MD;  Location: [facility];  Service: Cardiovascular;  Laterality: N/A;  . LEFT HEART CATH AND CORONARY ANGIOGRAPHY N/A 03/20/2017   Procedure: LEFT HEART CATH AND CORONARY ANGIOGRAPHY;  Surgeon: Burnell Blanks, MD;  Location: [place] [facility];  Service: Cardiovascular;  Laterality: N/A;    Social History   Socioeconomic History  . Marital status: Divorced    Spouse name: Not on file  . Number of children: Not on file  . Years of education: Not on file  . Highest education level: Not  on file  Social Needs  . Financial resource strain: Not on file  . Food insecurity - worry: Not on file  . Food insecurity - inability: Not on file  . Transportation needs - medical: Not on file  . Transportation needs - non-medical: Not on file  Occupational History  . Not on file  Tobacco Use  . Smoking status: Never Smoker  . Smokeless tobacco: Never Used  Substance and Sexual Activity  . Alcohol use: No    Alcohol/week: 0.0 oz  . Drug use: Yes    Types: Marijuana    Comment: occ  . Sexual activity: Yes    Birth control/protection: None  Other Topics Concern  . Not on file  Social History Narrative  . Not on file    Current Outpatient Medications on File Prior to Visit  Medication Sig Dispense Refill  . albuterol (PROVENTIL HFA;VENTOLIN HFA) 108 (90 Base) MCG/ACT inhaler Inhale 2 puffs into the lungs every 6 (six) hours as needed for wheezing or shortness of breath. 1 Inhaler 6  . ALPHA LIPOIC ACID PO Take 1 tablet by mouth daily.    Marland Kitchen aspirin EC 81 MG EC tablet Take 1 tablet (81 mg total) by mouth daily. 90 tablet 4  . atorvastatin (LIPITOR) 80 MG tablet Take 1 tablet (80 mg total) by mouth at bedtime. 90 tablet 3  . b complex vitamins tablet Take 1 tablet  by mouth daily.    Marland Kitchen buPROPion (WELLBUTRIN XL) 300 MG 24 hr tablet TAKE ONE TABLET BY MOUTH IN THE MORNING 30 tablet 11  . Carboxymethylcellul-Glycerin (LUBRICATING EYE DROPS OP) Place 1 drop into both eyes daily as needed (dry eyes).    . Continuous Glucose Monitor Sup (ENLITE GLUCOSE SENSOR) MISC 1 Device by Does not apply route once a week. 13 each 3  . FLUoxetine (PROZAC) 10 MG tablet Take 3 tablets (30 mg total) by mouth daily. 90 tablet 11  . glucose blood (BAYER CONTOUR NEXT TEST) test strip Test blood glucose 2-3 times a day. Dx code: 250.01 200 each 10  . Insulin Infusion Pump Supplies (MINIMED INFUSION SET-MMT 399) MISC See admin instructions. Use with Humulin insulin - continuous - basal rate 0.625    . insulin  lispro (HUMALOG) 100 UNIT/ML injection 35 units per insulin pump daily. 40 mL 11  . levothyroxine (SYNTHROID, LEVOTHROID) 112 MCG tablet Take 1 tablet (112 mcg total) by mouth daily before breakfast. 90 tablet 1  . mometasone (NASONEX) 50 MCG/ACT nasal spray Place 2 sprays into the nose daily as needed (seasonal allergies).     . montelukast (SINGULAIR) 10 MG tablet Take 1 tablet (10 mg total) by mouth at bedtime. 90 tablet 3  . nitroGLYCERIN (NITROSTAT) 0.4 MG SL tablet Place 1 tablet (0.4 mg total) under the tongue every 5 (five) minutes as needed for chest pain. 30 tablet 12  . ticagrelor (BRILINTA) 90 MG TABS tablet Take 1 tablet (90 mg total) by mouth 2 (two) times daily. 60 tablet 4   No current facility-administered medications on file prior to visit.     Allergies  Allergen Reactions  . Ciprofloxin Hcl [Ciprofloxacin] Nausea And Vomiting and Other (See Comments)    Severe migraine  . Food Color Red [Red Dye] Diarrhea    Family History  Problem Relation Age of Onset  . Coronary artery disease Father   . Cancer Mother        T cell lymphoma    BP 112/70 (BP Location: Left Arm, Patient Position: Sitting, Cuff Size: Normal)   Pulse 94   Wt 154 lb 12.8 oz (70.2 kg)   LMP 03/12/2017   SpO2 97%   BMI 25.76 kg/m   Review of Systems Denies LOC    Objective:   Physical Exam VITAL SIGNS:  See vs page GENERAL: no distress Pulses: dorsalis pedis intact bilat.   MSK: no deformity of the feet CV: no leg edema Skin:  no ulcer on the feet.  normal color and temp on the feet. Neuro: sensation is intact to touch on the feet   Lab Results  Component Value Date   CREATININE 0.73 03/22/2017   BUN 8 03/22/2017   NA 139 03/22/2017   K 3.8 03/22/2017   CL 105 03/22/2017   CO2 23 03/22/2017   A1c=7.9%     Assessment & Plan:  Type 1 DM, with CAD: she needs increased rx   Patient Instructions  check your blood sugar 8 times a day.  vary the time of day when you check,  between before the 3 meals, and at bedtime.  also check if you have symptoms of your blood sugar being too high or too low.  please keep a record of the readings and bring it to your next appointment here.  please call us sooner if your blood sugar goes below 70, or if you have a lot of readings over 200.   Please  take these pump settings:  basal rate of 0.65 units/hr, 24 HRS per day.  mealtime bolus of 1 unit/18 grams of carbohydrate.  correction bolus (which some people call "sensitivity," or "insulin sensitivity ratio," or just "isr") of 1 unit for each 60 by which your glucose exceeds 100.   Please come back for a follow-up appointment in 2 months.

## 2017-03-29 ENCOUNTER — Encounter: Payer: Self-pay | Admitting: Cardiology

## 2017-03-29 ENCOUNTER — Ambulatory Visit (INDEPENDENT_AMBULATORY_CARE_PROVIDER_SITE_OTHER): Payer: Self-pay | Admitting: Cardiology

## 2017-03-29 ENCOUNTER — Telehealth: Payer: Self-pay | Admitting: *Deleted

## 2017-03-29 VITALS — BP 100/70 | HR 85 | Ht 65.0 in | Wt 155.8 lb

## 2017-03-29 DIAGNOSIS — E785 Hyperlipidemia, unspecified: Secondary | ICD-10-CM

## 2017-03-29 DIAGNOSIS — I2510 Atherosclerotic heart disease of native coronary artery without angina pectoris: Secondary | ICD-10-CM

## 2017-03-29 DIAGNOSIS — I5020 Unspecified systolic (congestive) heart failure: Secondary | ICD-10-CM

## 2017-03-29 LAB — COMPREHENSIVE METABOLIC PANEL
A/G RATIO: 1.8 (ref 1.2–2.2)
ALBUMIN: 4.6 g/dL (ref 3.5–5.5)
ALK PHOS: 75 IU/L (ref 39–117)
ALT: 38 IU/L — ABNORMAL HIGH (ref 0–32)
AST: 32 IU/L (ref 0–40)
BILIRUBIN TOTAL: 0.5 mg/dL (ref 0.0–1.2)
BUN / CREAT RATIO: 11 (ref 9–23)
BUN: 11 mg/dL (ref 6–24)
CHLORIDE: 97 mmol/L (ref 96–106)
CO2: 21 mmol/L (ref 20–29)
CREATININE: 1 mg/dL (ref 0.57–1.00)
Calcium: 9.6 mg/dL (ref 8.7–10.2)
GFR calc Af Amer: 77 mL/min/{1.73_m2} (ref >59)
GFR calc non Af Amer: 67 mL/min/{1.73_m2} (ref >59)
GLOBULIN, TOTAL: 2.6 g/dL (ref 1.5–4.5)
Glucose: 214 mg/dL — ABNORMAL HIGH (ref 65–99)
Potassium: 4.6 mmol/L (ref 3.5–5.2)
SODIUM: 136 mmol/L (ref 134–144)
Total Protein: 7.2 g/dL (ref 6.0–8.5)

## 2017-03-29 LAB — CBC WITH DIFFERENTIAL/PLATELET
Basophils Absolute: 0 10*3/uL (ref 0.0–0.2)
Basos: 0 %
EOS (ABSOLUTE): 0.3 10*3/uL (ref 0.0–0.4)
EOS: 4 %
HEMATOCRIT: 38.6 % (ref 34.0–46.6)
Hemoglobin: 12.2 g/dL (ref 11.1–15.9)
Immature Grans (Abs): 0 10*3/uL (ref 0.0–0.1)
Immature Granulocytes: 0 %
LYMPHS ABS: 1.5 10*3/uL (ref 0.7–3.1)
Lymphs: 20 %
MCH: 30 pg (ref 26.6–33.0)
MCHC: 31.6 g/dL (ref 31.5–35.7)
MCV: 95 fL (ref 79–97)
MONOS ABS: 0.5 10*3/uL (ref 0.1–0.9)
Monocytes: 7 %
Neutrophils Absolute: 5.3 10*3/uL (ref 1.4–7.0)
Neutrophils: 69 %
Platelets: 410 10*3/uL — ABNORMAL HIGH (ref 150–379)
RBC: 4.06 x10E6/uL (ref 3.77–5.28)
RDW: 13.7 % (ref 12.3–15.4)
WBC: 7.7 10*3/uL (ref 3.4–10.8)

## 2017-03-29 MED ORDER — METOPROLOL SUCCINATE ER 25 MG PO TB24: 12.5000 mg | ORAL_TABLET | Freq: Every day | ORAL | 1 refills | Status: DC

## 2017-03-29 NOTE — Patient Instructions (Addendum)
Medication Instructions:  Your physician has recommended you make the following change in your medication:  1.  START Toprol XL 25 mg taking 1/2 tablet daily   Labwork: 05/10/17 FASTING LIPID / LFT  Testing/Procedures: None ordered  Follow-Up: Your physician recommends that you schedule a follow-up appointment in: 6/5/9 ARRIVE AT 1:15 P.M. TO SEE DR. Radford Pax    Any Other Special Instructions Will Be Listed Below (If Applicable).   If you need a refill on your cardiac medications before your next appointment, please call your pharmacy.

## 2017-03-29 NOTE — Telephone Encounter (Signed)
Follow up  ° ° °Patient is returning call. Please call to discuss.  °

## 2017-03-29 NOTE — Telephone Encounter (Signed)
Returned pts call and she has been made aware to come in for FASTING LIPID/LFT' S 4/18/119.

## 2017-03-29 NOTE — Progress Notes (Signed)
03/29/2017 Jennifer Hogan   [birth year]  [id number]  Primary Physician Hoyt Koch, MD Primary Cardiologist: Dr. Radford Pax   Reason for Visit/CC: [facility] F/u [facility] for CAD/ NSTEMI s/p multiple PCI + Systolic HF  HPI: Ms. Lavina Hamman presents to clinic today for [facility] post hospital f/u.   In summary, she is a 47 year old female with type 1 diabetes mellitus on chronic insulin pump, hypothyroidism, was recently admitted to the hospital in the setting of a GI illness with nausea vomiting diarrhea, felt to be dehydrated, received IV fluids and was discharged home. During that admission, she underwent blood cultures that eventually grew gram-negative rods. Patient was called to come back for readmission and was placed on antibiotics. 2D echo was obtained and showed depressed LVEF at 45-50% and hypokinesis of apical myocardium. Of note, her troponin's were also mildly elevated ruling her in for NSTEMI (troponin peaked at 1.24), thus Cardiology was consulted and cardiac cath was recommended. She underwent cardiac cath 03/20/17 which showed severe triple-vessel CAD (angiographic details outlined below). She received stentingx4,PTCA/DES x 3 in the LAD (ostial DES placed, mid DES placed, distal DES placed). PTCA/DES x 1 intermediate branch.She was placed on DAPT w/ ASA and Brilinta, along with high dose statin therapy, Lipitor 80 mg, (LDL elevated at 126 mg/dL). She does not appear to be on a BB. She had issues with orthostatic hypotension, thus no ACE/ARB was added.   She presents to clinic for post hospital f/u. She reports she has done well from a cardiac standpoint. She denies CP. No dyspnea, orthopnea, PND or LEE. She reports full med compliance. No abnormal bleeding w/ ASA and Brilinta. She is tolerating Lipitor w/o side effects. She has been walking daily at home w/o exertional symptoms. Her right radial cath site is stable. BP is low normal at 100/70. Pulse rate is 85 bpm.    Cardiac  Studies  Echo 03/16/17 Study Conclusions - Left ventricle: The cavity size was normal. Systolic function was mildly reduced. The estimated ejection fraction was in the range of 45% to 50%. Hypokinesis of the apical myocardium. Features are consistent with a pseudonormal left ventricular filling pattern, with concomitant abnormal relaxation and increased filling pressure (grade 2 diastolic dysfunction). - Pulmonary arteries: Systolic pressure was mildly increased. PA peak pressure: 37 mm Hg (S). Impressions: - Appearnce suggests &quot;apical ballooning&quot;. This could representischemia due to stenosis in a &quot;wrap-around&quot; LAD artery or stress cardiomyopathy (takotsubo syndrome).  Cardiac cath: 03/20/2017  Prox Cx lesion is 40% stenosed.  Mid Cx lesion is 40% stenosed.  Ost 1st Mrg to 1st Mrg lesion is 80% stenosed.  Dist LM to Ost LAD lesion is 70% stenosed.  Prox LAD lesion is 70% stenosed.  Mid LAD-1 lesion is 95% stenosed.  Mid LAD-2 lesion is 95% stenosed.  Dist LAD lesion is 99% stenosed.  A drug-eluting stent was successfully placed using a STENT SYNERGY DES 2.5X16.  Post intervention, there is a 0% residual stenosis.  A drug-eluting stent was successfully placed using a STENT SYNERGY DES 3X12.  Post intervention, there is a 0% residual stenosis.  A drug-eluting stent was successfully placed using a STENT SYNERGY DES 2.75X16.  Post intervention, there is a 0% residual stenosis.  A drug-eluting stent was successfully placed using a STENT SYNERGY DES 2.25X38.  Post intervention, there is a 0% residual stenosis.  Post intervention, there is a 0% residual stenosis. 1. Severe double vessel CAD. 2. Severe stenosis in the ostial LAD, mid LAD and distal LAD.  PTCA/DES x 3 in the LAD (ostial DES placed, mid DES placed, distal DES placed).  3. Severe stenosis intermediate branch. Successful PTCA/DES x 1 intermediate branch.  4. Moderate  non-obstructive disease in the proximal and mid Circumflex.  5. Severe stenosis in the apical LAD. Due to the distal location, no PCI performed.  Recommendations: Continue DAPT with ASA and Brilinta for one year. Continue statin and beta blocker.  Post-Intervention Diagram           Current Meds  Medication Sig  . albuterol (PROVENTIL HFA;VENTOLIN HFA) 108 (90 Base) MCG/ACT inhaler Inhale 2 puffs into the lungs every 6 (six) hours as needed for wheezing or shortness of breath.  . ALPHA LIPOIC ACID PO Take 1 tablet by mouth daily.  Marland Kitchen aspirin EC 81 MG EC tablet Take 1 tablet (81 mg total) by mouth daily.  Marland Kitchen atorvastatin (LIPITOR) 80 MG tablet Take 1 tablet (80 mg total) by mouth at bedtime.  Marland Kitchen b complex vitamins tablet Take 1 tablet by mouth daily.  Marland Kitchen buPROPion (WELLBUTRIN XL) 300 MG 24 hr tablet TAKE ONE TABLET BY MOUTH IN THE MORNING  . Carboxymethylcellul-Glycerin (LUBRICATING EYE DROPS OP) Place 1 drop into both eyes daily as needed (dry eyes).  . Continuous Glucose Monitor Sup (ENLITE GLUCOSE SENSOR) MISC 1 Device by Does not apply route once a week.  Marland Kitchen FLUoxetine (PROZAC) 10 MG tablet Take 3 tablets (30 mg total) by mouth daily.  Marland Kitchen glucose blood (BAYER CONTOUR NEXT TEST) test strip Test blood glucose 2-3 times a day. Dx code: 250.01  . Insulin Infusion Pump Supplies (MINIMED INFUSION SET-MMT 399) MISC See admin instructions. Use with Humulin insulin - continuous - basal rate 0.625  . insulin lispro (HUMALOG) 100 UNIT/ML injection 35 units per insulin pump daily.  Marland Kitchen levothyroxine (SYNTHROID, LEVOTHROID) 112 MCG tablet Take 1 tablet (112 mcg total) by mouth daily before breakfast.  . mometasone (NASONEX) 50 MCG/ACT nasal spray Place 2 sprays into the nose daily as needed (seasonal allergies).   . montelukast (SINGULAIR) 10 MG tablet Take 1 tablet (10 mg total) by mouth at bedtime.  . nitroGLYCERIN (NITROSTAT) 0.4 MG SL tablet Place 1 tablet (0.4 mg total) under the tongue every 5  (five) minutes as needed for chest pain.  . ticagrelor (BRILINTA) 90 MG TABS tablet Take 1 tablet (90 mg total) by mouth 2 (two) times daily.   Allergies  Allergen Reactions  . Ciprofloxin Hcl [Ciprofloxacin] Nausea And Vomiting and Other (See Comments)    Severe migraine  . Food Color Red [Red Dye] Diarrhea   Past Medical History:  Diagnosis Date  . Acute sinusitis 08/07/2015  . Allergic state 08/07/2015  . Anxiety   . Depression   . Diabetes mellitus   . High cholesterol   . Hypothyroidism   . NSTEMI (non-ST elevated myocardial infarction) ([place]) 03/17/2017   s/p DES x 3 LAD, DES OM   Family History  Problem Relation Age of Onset  . Coronary artery disease Father   . Cancer Mother        T cell lymphoma   Past Surgical History:  Procedure Laterality Date  . APPENDECTOMY    . CORONARY STENT INTERVENTION N/A 03/20/2017   Procedure: DES x 3 LAD, DES OM; Surgeon: Burnell Blanks, MD;  Location: [place] [facility];  Service: Cardiovascular;  Laterality: N/A;  . LEFT HEART CATH AND CORONARY ANGIOGRAPHY N/A 03/20/2017   Procedure: LEFT HEART CATH AND CORONARY ANGIOGRAPHY;  Surgeon: Burnell Blanks,  MD;  Location: [place] [facility];  Service: Cardiovascular;  Laterality: N/A;   Social History   Socioeconomic History  . Marital status: Divorced    Spouse name: Not on file  . Number of children: Not on file  . Years of education: Not on file  . Highest education level: Not on file  Social Needs  . Financial resource strain: Not on file  . Food insecurity - worry: Not on file  . Food insecurity - inability: Not on file  . Transportation needs - medical: Not on file  . Transportation needs - non-medical: Not on file  Occupational History  . Not on file  Tobacco Use  . Smoking status: Never Smoker  . Smokeless tobacco: Never Used  Substance and Sexual Activity  . Alcohol use: No    Alcohol/week: 0.0 oz  . Drug use: Yes    Types: Marijuana    Comment: occ    . Sexual activity: Yes    Birth control/protection: None  Other Topics Concern  . Not on file  Social History Narrative  . Not on file     Review of Systems: General: negative for chills, fever, night sweats or weight changes.  Cardiovascular: negative for chest pain, dyspnea on exertion, edema, orthopnea, palpitations, paroxysmal nocturnal dyspnea or shortness of breath Dermatological: negative for rash Respiratory: negative for cough or wheezing Urologic: negative for hematuria Abdominal: negative for nausea, vomiting, diarrhea, bright red blood per rectum, melena, or hematemesis Neurologic: negative for visual changes, syncope, or dizziness All other systems reviewed and are otherwise negative except as noted above.   Physical Exam:  Blood pressure 100/70, pulse 85, height 5\' 5"  (1.651 m), weight 155 lb 12.8 oz (70.7 kg), last menstrual period 03/12/2017, SpO2 99 %.  General appearance: alert, cooperative and no distress Neck: no carotid bruit and no JVD Lungs: clear to auscultation bilaterally Heart: regular rate and rhythm, S1, S2 normal, no murmur, click, rub or gallop Extremities: extremities normal, atraumatic, no cyanosis or edema Pulses: 2+ and symmetric Skin: Skin color, texture, turgor normal. No rashes or lesions Neurologic: Grossly normal  EKG Not performed -- personally reviewed   ASSESSMENT AND PLAN:   1. CAD/ NSTEMI: cath 03/20/17 showed severe double vessel CAD involving the LAD s/p DES to the ostial, mid and distal LAD, + severe stenosis of the intermediate branch also treated with PCI +DES. She also had Moderate non-obstructive disease in the proximal and mid Circumflex. EF 45-50%. She denies CP. No dyspnea. No post cath complications. Right radial cath site is stable. Continue DAPT with ASA + Brilinta, along with high dose statin therapy. We will add low dose BB today, 12.5 mg of Toprol XL, which will also help with LV dysfunction. No room in BP for ACE/ARB at  this time.  2. Systolic HF: mild LV dysfunction, 45-50% on recent echo, in the setting of CAD involving the LAD and LCx, s/p PCI. We will add low dose metoprolol today, Toprol XL 12.5 mg daily. Her BP is too soft to add an ACe/ARB at this time. Volume appears stable. No dyspnea.   3. Type I DM: on insulin pump. Followed by PCP.   4. HLD: LDL 126 mg/dL on recent lipid panel. Lipitor 80 mg recently added. Repeat FLP and HFTs in 6 weeks. If not at goal of < 70 mg/dL, then we can later add Zetia.   5. Bacteremia: pt has completed course of antibiotics. She denies fever and chills.    Follow-Up  s/ Dr. Radford Pax in 2-3 months.   Brittainy Ladoris Gene, MHS [facility] 03/29/2017 4:50 PM

## 2017-03-29 NOTE — Telephone Encounter (Signed)
Tried to reach pt to let her know that [place] forgot to mention that she wanted pt to get fasting lipid/lft in 6 weeks.  I have set this up for 05/10/17, anytime that morning, just make sure she is fasting. I left pt a message that I was out of the office on Friday - Monday, but if she called, someone in Triage could relay the message.

## 2017-04-04 ENCOUNTER — Telehealth (HOSPITAL_COMMUNITY): Payer: Self-pay

## 2017-04-04 ENCOUNTER — Encounter (HOSPITAL_COMMUNITY): Payer: Self-pay

## 2017-04-04 NOTE — Telephone Encounter (Signed)
2nd attempt to call patient in regards to Insurance - lm on vm. Sending letter.

## 2017-04-05 LAB — GLUCOSE, POCT (MANUAL RESULT ENTRY): POC Glucose: 202 mg/dl — AB (ref 70–99)

## 2017-04-09 ENCOUNTER — Ambulatory Visit: Payer: Self-pay | Attending: Internal Medicine

## 2017-04-09 ENCOUNTER — Encounter: Payer: Self-pay | Admitting: Critical Care Medicine

## 2017-04-09 MED FILL — ATORVASTATIN 80 MG TABLET: 80 | 30 days supply | Qty: 30 | Fill #0

## 2017-04-10 NOTE — Telephone Encounter (Signed)
You were listed as the PCP. Apologies. Our provider is a PRN pulmonologist that she is directing this message to. I will route to the PCP she will be reassigned too. She has not established with our clinic.

## 2017-04-10 NOTE — Telephone Encounter (Signed)
Please address with patient. 

## 2017-04-13 ENCOUNTER — Telehealth (HOSPITAL_COMMUNITY): Payer: Self-pay

## 2017-04-13 NOTE — Telephone Encounter (Signed)
Called and spoke with patient in regards to Cardiac Rehab Maintenance program as patient stated she does not have insurance. Patient is interested in Maintenance - Passed referral to Maintenance Coordinator.

## 2017-04-30 ENCOUNTER — Ambulatory Visit: Payer: Self-pay | Attending: Nurse Practitioner | Admitting: Nurse Practitioner

## 2017-04-30 ENCOUNTER — Encounter: Payer: Self-pay | Admitting: Nurse Practitioner

## 2017-04-30 VITALS — BP 114/77 | HR 81 | Temp 99.1°F | Ht 65.0 in | Wt 155.0 lb

## 2017-04-30 DIAGNOSIS — Z8249 Family history of ischemic heart disease and other diseases of the circulatory system: Secondary | ICD-10-CM | POA: Insufficient documentation

## 2017-04-30 DIAGNOSIS — Z79899 Other long term (current) drug therapy: Secondary | ICD-10-CM | POA: Insufficient documentation

## 2017-04-30 DIAGNOSIS — F329 Major depressive disorder, single episode, unspecified: Secondary | ICD-10-CM | POA: Insufficient documentation

## 2017-04-30 DIAGNOSIS — E039 Hypothyroidism, unspecified: Secondary | ICD-10-CM

## 2017-04-30 DIAGNOSIS — J309 Allergic rhinitis, unspecified: Secondary | ICD-10-CM

## 2017-04-30 DIAGNOSIS — I252 Old myocardial infarction: Secondary | ICD-10-CM | POA: Insufficient documentation

## 2017-04-30 DIAGNOSIS — Z807 Family history of other malignant neoplasms of lymphoid, hematopoietic and related tissues: Secondary | ICD-10-CM | POA: Insufficient documentation

## 2017-04-30 DIAGNOSIS — Z794 Long term (current) use of insulin: Secondary | ICD-10-CM | POA: Insufficient documentation

## 2017-04-30 DIAGNOSIS — Z7902 Long term (current) use of antithrombotics/antiplatelets: Secondary | ICD-10-CM | POA: Insufficient documentation

## 2017-04-30 DIAGNOSIS — Z9641 Presence of insulin pump (external) (internal): Secondary | ICD-10-CM | POA: Insufficient documentation

## 2017-04-30 DIAGNOSIS — Z955 Presence of coronary angioplasty implant and graft: Secondary | ICD-10-CM | POA: Insufficient documentation

## 2017-04-30 DIAGNOSIS — F32A Depression, unspecified: Secondary | ICD-10-CM

## 2017-04-30 DIAGNOSIS — E1042 Type 1 diabetes mellitus with diabetic polyneuropathy: Secondary | ICD-10-CM

## 2017-04-30 DIAGNOSIS — E7800 Pure hypercholesterolemia, unspecified: Secondary | ICD-10-CM | POA: Insufficient documentation

## 2017-04-30 DIAGNOSIS — Z881 Allergy status to other antibiotic agents status: Secondary | ICD-10-CM | POA: Insufficient documentation

## 2017-04-30 DIAGNOSIS — Z79890 Hormone replacement therapy (postmenopausal): Secondary | ICD-10-CM | POA: Insufficient documentation

## 2017-04-30 DIAGNOSIS — Z7982 Long term (current) use of aspirin: Secondary | ICD-10-CM | POA: Insufficient documentation

## 2017-04-30 DIAGNOSIS — F419 Anxiety disorder, unspecified: Secondary | ICD-10-CM | POA: Insufficient documentation

## 2017-04-30 LAB — GLUCOSE, POCT (MANUAL RESULT ENTRY): POC Glucose: 218 mg/dl — AB (ref 70–99)

## 2017-04-30 MED ORDER — MONTELUKAST SODIUM 10 MG PO TABS: 10.0000 mg | ORAL_TABLET | Freq: Every day | ORAL | 3 refills | Status: DC

## 2017-04-30 MED ORDER — INSULIN LISPRO 100 UNIT/ML ~~LOC~~ SOLN
SUBCUTANEOUS | 11 refills | Status: DC
Start: 2017-04-30 — End: 2017-09-25

## 2017-04-30 MED ORDER — LEVOTHYROXINE SODIUM 112 MCG PO TABS: 112.0000 ug | ORAL_TABLET | Freq: Every day | ORAL | 1 refills | Status: DC

## 2017-04-30 MED ORDER — FLUOXETINE HCL 10 MG PO TABS: 30.0000 mg | ORAL_TABLET | Freq: Every day | ORAL | 11 refills | Status: DC

## 2017-04-30 MED ORDER — BUPROPION HCL ER (XL) 300 MG PO TB24: 300.0000 mg | ORAL_TABLET | Freq: Every morning | ORAL | 3 refills | Status: DC

## 2017-04-30 NOTE — Progress Notes (Signed)
Assessment & Plan:  Jennifer Hogan was seen today for establish care and medication refill.  Diagnoses and all orders for this visit:  Type 1 diabetes mellitus with diabetic polyneuropathy (HCC) -     Glucose (CBG) -     insulin lispro (HUMALOG) 100 UNIT/ML injection; 35 units per insulin pump daily. -     Microalbumin/Creatinine Ratio, Urine; Future  Anxiety and depression -     buPROPion (WELLBUTRIN XL) 300 MG 24 hr tablet; Take 1 tablet (300 mg total) by mouth every morning. -     FLUoxetine (PROZAC) 10 MG tablet; Take 3 tablets (30 mg total) by mouth daily.  Hypothyroidism, unspecified type -     levothyroxine (SYNTHROID, LEVOTHROID) 112 MCG tablet; Take 1 tablet (112 mcg total) by mouth daily before breakfast. Chronic. Stable.  Lab Results  Component Value Date   TSH 1.302 03/16/2017    Allergic rhinitis, unspecified seasonality, unspecified trigger -     montelukast (SINGULAIR) 10 MG tablet; Take 1 tablet (10 mg total) by mouth at bedtime.    Patient has been counseled on age-appropriate routine health concerns for screening and prevention. These are reviewed and up-to-date. Referrals have been placed accordingly. Immunizations are up-to-date or declined.    Subjective:   Chief Complaint  Patient presents with  . Establish Care    Pt. is here to establish care.   . Medication Refill   HPI Jennifer Hogan 47 y.o. female presents to office today to establish care.  She has a history of type 1 diabetes mellitus and is currently seeing endocrinology, hypothyroidism which endocrinology is also following along, depression and anxiety,  allergic rhinitis, NSTEMI with DES x3 (02-2017 currently being followed by Cardiology as well) and Bacteremia (currently resolved).  DM TYPE 1 She has an insulin pump (2014).  She currently denies any symptoms of hypo-or hyperglycemia.  She currently does not have any health insurance.  She was instructed to make an appointment as soon as possible  with the financial counselor here.  She was also instructed to follow-up with Department of Social Services (to apply for Medicaid is possible).   Depression and Anxiety Chronic. Stable. Well controlled with Wellbutrin 300 mg daily and Prozac 30 mg daily.  She denies any current thoughts of suicidal ideation.   Review of Systems  Constitutional: Negative for fever, malaise/fatigue and weight loss.  HENT: Negative.  Negative for nosebleeds.   Eyes: Negative.  Negative for blurred vision, double vision and photophobia.  Respiratory: Negative.  Negative for cough and shortness of breath.   Cardiovascular: Negative.  Negative for chest pain, palpitations and leg swelling.  Gastrointestinal: Negative.  Negative for heartburn, nausea and vomiting.  Musculoskeletal: Negative.  Negative for myalgias.  Neurological: Negative.  Negative for dizziness, focal weakness, seizures and headaches.  Endo/Heme/Allergies: Positive for environmental allergies.  Psychiatric/Behavioral: Positive for depression. Negative for suicidal ideas. The patient is nervous/anxious.     Past Medical History:  Diagnosis Date  . Acute sinusitis 08/07/2015  . Allergic state 08/07/2015  . Anxiety   . Depression   . Diabetes mellitus   . High cholesterol   . Hypothyroidism   . NSTEMI (non-ST elevated myocardial infarction) ([place]) 03/17/2017   s/p DES x 3 LAD, DES OM    Past Surgical History:  Procedure Laterality Date  . APPENDECTOMY    . CORONARY STENT INTERVENTION N/A 03/20/2017   Procedure: DES x 3 LAD, DES OM; Surgeon: Burnell Blanks, MD;  Location: [place] [facility];  Service: Cardiovascular;  Laterality: N/A;  . LEFT HEART CATH AND CORONARY ANGIOGRAPHY N/A 03/20/2017   Procedure: LEFT HEART CATH AND CORONARY ANGIOGRAPHY;  Surgeon: Burnell Blanks, MD;  Location: [facility];  Service: Cardiovascular;  Laterality: N/A;    Family History  Problem Relation Age of Onset  . Coronary artery  disease Father   . Congestive Heart Failure Father   . Cancer Mother        T cell lymphoma    Social History Reviewed with no changes to be made today.   Outpatient Medications Prior to Visit  Medication Sig Dispense Refill  . albuterol (PROVENTIL HFA;VENTOLIN HFA) 108 (90 Base) MCG/ACT inhaler Inhale 2 puffs into the lungs every 6 (six) hours as needed for wheezing or shortness of breath. 1 Inhaler 6  . ALPHA LIPOIC ACID PO Take 1 tablet by mouth daily.    Marland Kitchen aspirin EC 81 MG EC tablet Take 1 tablet (81 mg total) by mouth daily. 90 tablet 4  . atorvastatin (LIPITOR) 80 MG tablet Take 1 tablet (80 mg total) by mouth at bedtime. 90 tablet 3  . b complex vitamins tablet Take 1 tablet by mouth daily.    . Carboxymethylcellul-Glycerin (LUBRICATING EYE DROPS OP) Place 1 drop into both eyes daily as needed (dry eyes).    . Continuous Glucose Monitor Sup (ENLITE GLUCOSE SENSOR) MISC 1 Device by Does not apply route once a week. 13 each 3  . glucose blood (BAYER CONTOUR NEXT TEST) test strip Test blood glucose 2-3 times a day. Dx code: 250.01 200 each 10  . Insulin Infusion Pump Supplies (MINIMED INFUSION SET-MMT 399) MISC See admin instructions. Use with Humulin insulin - continuous - basal rate 0.625    . metoprolol succinate (TOPROL XL) 25 MG 24 hr tablet Take 0.5 tablets (12.5 mg total) by mouth daily. 45 tablet 1  . mometasone (NASONEX) 50 MCG/ACT nasal spray Place 2 sprays into the nose daily as needed (seasonal allergies).     . ticagrelor (BRILINTA) 90 MG TABS tablet Take 1 tablet (90 mg total) by mouth 2 (two) times daily. 60 tablet 4  . buPROPion (WELLBUTRIN XL) 300 MG 24 hr tablet TAKE ONE TABLET BY MOUTH IN THE MORNING 30 tablet 11  . FLUoxetine (PROZAC) 10 MG tablet Take 3 tablets (30 mg total) by mouth daily. 90 tablet 11  . insulin lispro (HUMALOG) 100 UNIT/ML injection 35 units per insulin pump daily. 40 mL 11  . levothyroxine (SYNTHROID, LEVOTHROID) 112 MCG tablet Take 1 tablet (112  mcg total) by mouth daily before breakfast. 90 tablet 1  . montelukast (SINGULAIR) 10 MG tablet Take 1 tablet (10 mg total) by mouth at bedtime. 90 tablet 3  . nitroGLYCERIN (NITROSTAT) 0.4 MG SL tablet Place 1 tablet (0.4 mg total) under the tongue every 5 (five) minutes as needed for chest pain. (Patient not taking: Reported on 04/30/2017) 30 tablet 12   No facility-administered medications prior to visit.     Allergies  Allergen Reactions  . Ciprofloxin Hcl [Ciprofloxacin] Nausea And Vomiting and Other (See Comments)    Severe migraine  . Food Color Red [Red Dye] Diarrhea       Objective:    BP 114/77 (BP Location: Left Arm, Patient Position: Sitting, Cuff Size: Normal)   Pulse 81   Temp 99.1 F (37.3 C) (Oral)   Ht 5\' 5"  (1.651 m)   Wt 155 lb (70.3 kg)   LMP 04/22/2017   SpO2  97%   BMI 25.79 kg/m  Wt Readings from Last 3 Encounters:  04/30/17 155 lb (70.3 kg)  03/29/17 155 lb 12.8 oz (70.7 kg)  03/28/17 154 lb 12.8 oz (70.2 kg)    Physical Exam  Constitutional: She is oriented to person, place, and time. She appears well-developed and well-nourished. She is cooperative.  HENT:  Head: Normocephalic and atraumatic.  Eyes: EOM are normal.  Neck: Normal range of motion.  Cardiovascular: Normal rate, regular rhythm and normal heart sounds. Exam reveals no gallop and no friction rub.  No murmur heard. Pulmonary/Chest: Effort normal and breath sounds normal. No tachypnea. No respiratory distress. She has no decreased breath sounds. She has no wheezes. She has no rhonchi. She has no rales. She exhibits no tenderness.  Abdominal: Soft. Bowel sounds are normal.  Musculoskeletal: Normal range of motion. She exhibits no edema.  Neurological: She is alert and oriented to person, place, and time. Coordination normal.  Skin: Skin is warm and dry.  Psychiatric: She has a normal mood and affect. Her behavior is normal. Judgment and thought content normal.  Nursing note and vitals  reviewed.     Patient has been counseled extensively about nutrition and exercise as well as the importance of adherence with medications and regular follow-up. The patient was given clear instructions to go to ER or return to medical center if symptoms don't improve, worsen or new problems develop. The patient verbalized understanding.   Follow-up: Return if symptoms worsen or fail to improve.   [name], FNP-BC [facility] [place], [place]   05/01/2017, 9:02 PM

## 2017-04-30 NOTE — Patient Instructions (Signed)
Microalbumin Test Why am I having this test? Albumin is a protein in your body that helps regulate how much water is in your blood. As your kidneys filter your blood to get rid of waste products through your urine, albumin should remain in your bloodstream. However, certain types of kidney disease can cause albumin to move from damaged blood vessels inside your kidneys and into your urine. When this happens, small amounts of albumin (microalbumin, MA) can be detected in your urine. This type of kidney damage is a common complication of diabetes mellitus, especially when blood sugar (glucose) has not been well controlled. The MA test may also help your health care provider diagnose other related medical conditions such as cardiovascular disease and high blood pressure. The MA test is often performed along with calculating urine creatinine levels. Creatinine is another waste product filtered out of your blood by your kidneys. You may have this test if:  You have diabetes and are showing signs of kidney damage.  Your health care provider wants to determine how well your blood glucose has been controlled over many years.  Your health care provider wants to determine your kidney function when other related tests are normal.  What kind of sample is taken? A urine sample is collected in a sterile container given to you by the lab. What are the reference values? Reference valuesare considered healthy valuesestablished after testing a large group of healthy people. Reference values may vary among different people, labs, and hospitals. It is your responsibility to obtain your test results. Ask the lab or department performing the test when and how you will get your results. A reference value for MA is any value less than 2 mg/L. A reference value for MA compared to creatinine is:  Men: less than 17 mg/g creatinine.  Women: less than 25 mg/g creatinine.  What do the results mean? MA test results that  are higher than the reference values may indicate many health conditions. These may include:  Diabetes mellitus.  Poorly controlled diabetes mellitus.  Myoglobulinuria.  Hemoglobinuria.  Bence-Jones proteinuria.  Use of medicines or drugs that are damaging to the kidneys.  Atherosclerosis.  Blood fat (lipid) abnormalities.  Insulin resistance.  High blood pressure.  Heart attack.  Talk with your health care provider to discuss your results, treatment options, and if necessary, the need for more tests. Talk with your health care provider if you have any questions about your results. Talk with your health care provider to discuss your results, treatment options, and if necessary, the need for more tests. Talk with your health care provider if you have any questions about your results. This information is not intended to replace advice given to you by your health care provider. Make sure you discuss any questions you have with your health care provider. Document Released: 02/12/2004 Document Revised: 09/15/2015 Document Reviewed: 05/30/2013 Elsevier Interactive Patient Education  2018 [employer].

## 2017-05-01 ENCOUNTER — Encounter: Payer: Self-pay | Admitting: Nurse Practitioner

## 2017-05-10 ENCOUNTER — Other Ambulatory Visit: Payer: Self-pay

## 2017-05-10 DIAGNOSIS — E785 Hyperlipidemia, unspecified: Secondary | ICD-10-CM

## 2017-05-10 LAB — LIPID PANEL
CHOL/HDL RATIO: 2.1 ratio (ref 0.0–4.4)
CHOLESTEROL TOTAL: 155 mg/dL (ref 100–199)
HDL: 73 mg/dL (ref >39)
LDL CALC: 66 mg/dL (ref 0–99)
TRIGLYCERIDES: 80 mg/dL (ref 0–149)
VLDL Cholesterol Cal: 16 mg/dL (ref 5–40)

## 2017-05-10 LAB — HEPATIC FUNCTION PANEL
ALBUMIN: 4.3 g/dL (ref 3.5–5.5)
ALK PHOS: 83 IU/L (ref 39–117)
ALT: 32 IU/L (ref 0–32)
AST: 29 IU/L (ref 0–40)
BILIRUBIN, DIRECT: 0.12 mg/dL (ref 0.00–0.40)
Bilirubin Total: 0.4 mg/dL (ref 0.0–1.2)
TOTAL PROTEIN: 6.9 g/dL (ref 6.0–8.5)

## 2017-05-10 MED FILL — !HUMALOG 100 UNITS/ML VIAL: 100 | 28 days supply | Qty: 10 | Fill #0

## 2017-05-10 MED FILL — BUPROPION HCL XL 300 MG TAB: 300 | 30 days supply | Qty: 30 | Fill #0

## 2017-05-10 MED FILL — MONTELUKAST SOD 10 MG TAB: 10 | 30 days supply | Qty: 30 | Fill #0

## 2017-05-10 MED FILL — LEVOTHYROXINE 112 MCG TAB: 112 | 30 days supply | Qty: 30 | Fill #0

## 2017-05-10 MED FILL — FLUoxetine HCL 10 MG CAPS: 10 | 30 days supply | Qty: 90 | Fill #0

## 2017-05-10 NOTE — Addendum Note (Signed)
Addended by: Darliss Ridgel D on: 05/10/2017 09:05 AM   Modules accepted: Orders

## 2017-05-11 LAB — MICROALBUMIN / CREATININE URINE RATIO
Creatinine, Urine: 222.9 mg/dL
MICROALB/CREAT RATIO: 3.4 mg/g{creat} (ref 0.0–30.0)
Microalbumin, Urine: 7.6 ug/mL

## 2017-05-15 ENCOUNTER — Telehealth (HOSPITAL_COMMUNITY): Payer: Self-pay | Admitting: *Deleted

## 2017-05-28 ENCOUNTER — Ambulatory Visit: Payer: Self-pay | Admitting: Endocrinology

## 2017-06-04 ENCOUNTER — Telehealth (HOSPITAL_COMMUNITY): Payer: Self-pay | Admitting: *Deleted

## 2017-06-08 ENCOUNTER — Other Ambulatory Visit: Payer: Self-pay | Admitting: Obstetrics and Gynecology

## 2017-06-08 DIAGNOSIS — Z1231 Encounter for screening mammogram for malignant neoplasm of breast: Secondary | ICD-10-CM

## 2017-06-11 MED FILL — LEVOTHYROXINE 112 MCG TAB: 112 | 30 days supply | Qty: 30 | Fill #1

## 2017-06-11 MED FILL — !VENTOLIN HFA INHALER: 108 (90 BAS | 25 days supply | Qty: 18 | Fill #0

## 2017-06-11 MED FILL — ATORVASTATIN 80 MG TABLET: 80 | 30 days supply | Qty: 30 | Fill #1

## 2017-06-11 MED FILL — FLUoxetine HCL 10 MG CAPS: 10 | 30 days supply | Qty: 90 | Fill #1

## 2017-06-11 MED FILL — METOPROLOL SUCCINATE ER 25: 25 | 30 days supply | Qty: 15 | Fill #0 | Status: TO

## 2017-06-11 MED FILL — MONTELUKAST SOD 10 MG TAB: 10 | 30 days supply | Qty: 30 | Fill #1

## 2017-06-11 MED FILL — !HUMALOG 100 UNITS/ML VIAL: 100 | 28 days supply | Qty: 10 | Fill #1

## 2017-06-11 MED FILL — BUPROPION HCL XL 300 MG TAB: 300 | 30 days supply | Qty: 30 | Fill #1

## 2017-06-13 ENCOUNTER — Encounter: Payer: Self-pay | Admitting: Endocrinology

## 2017-06-13 ENCOUNTER — Ambulatory Visit: Payer: Self-pay | Admitting: Endocrinology

## 2017-06-13 VITALS — BP 118/68 | HR 75 | Wt 153.0 lb

## 2017-06-13 DIAGNOSIS — E108 Type 1 diabetes mellitus with unspecified complications: Secondary | ICD-10-CM

## 2017-06-13 LAB — POCT GLYCOSYLATED HEMOGLOBIN (HGB A1C): Hemoglobin A1C: 10.8 % — AB (ref 4.0–5.6)

## 2017-06-13 NOTE — Progress Notes (Signed)
Subjective:    [name], female    DOB: [birth year], 47 y.o.   MRN: [id number]  HPI Pt returns for f/u of diabetes mellitus:  DM type: 1  Dx'ed: 1017 Complications: CAD Therapy: insulin since dx.  GDM: never.  DKA: never Severe hypoglycemia: never.  Pancreatitis: never Other: she has been on pump (medtronic) rx since 2014; she stopped continuous glucose monitor, due to cost; rx is limited by lack of health insurance.   Interval history: She takes these settings:   basal rate of 0.65 units/hr, 24 HRS per day.  mealtime bolus of 1 unit/18 grams of carbohydrate.  correction bolus (which some people call "sensitivity," or "insulin sensitivity ratio," or just "isr") of 1 unit for each 60 by which your glucose exceeds 100.   TDD is approx 30 units/day.  Pt states she is working on checking more, but she says cbg's are mildly low several times per week.  There is no trend throughout the day.  Pt says she never misses the boluses.   Past Medical History:  Diagnosis Date  . Acute sinusitis 08/07/2015  . Allergic state 08/07/2015  . Anxiety   . Depression   . Diabetes mellitus   . High cholesterol   . Hypothyroidism   . NSTEMI (non-ST elevated myocardial infarction) ([place]) 03/17/2017   s/p DES x 3 LAD, DES OM    Past Surgical History:  Procedure Laterality Date  . APPENDECTOMY    . CORONARY STENT INTERVENTION N/A 03/20/2017   Procedure: DES x 3 LAD, DES OM; Surgeon: Burnell Blanks, MD;  Location: [facility];  Service: Cardiovascular;  Laterality: N/A;  . LEFT HEART CATH AND CORONARY ANGIOGRAPHY N/A 03/20/2017   Procedure: LEFT HEART CATH AND CORONARY ANGIOGRAPHY;  Surgeon: Burnell Blanks, MD;  Location: [facility];  Service: Cardiovascular;  Laterality: N/A;    Social History   Socioeconomic History  . Marital status: Divorced    Spouse name: Not on file  . Number of children: Not on file  . Years of education: Not on file  .  Highest education level: Not on file  Occupational History  . Not on file  Social Needs  . Financial resource strain: Not on file  . Food insecurity:    Worry: Not on file    Inability: Not on file  . Transportation needs:    Medical: Not on file    Non-medical: Not on file  Tobacco Use  . Smoking status: Never Smoker  . Smokeless tobacco: Never Used  . Tobacco comment: Use marijuana  Substance and Sexual Activity  . Alcohol use: No    Alcohol/week: 0.0 oz  . Drug use: Yes    Types: Marijuana    Comment: occ  . Sexual activity: Yes    Birth control/protection: None  Lifestyle  . Physical activity:    Days per week: Not on file    Minutes per session: Not on file  . Stress: Not on file  Relationships  . Social connections:    Talks on phone: Not on file    Gets together: Not on file    Attends religious service: Not on file    Active member of club or organization: Not on file    Attends meetings of clubs or organizations: Not on file    Relationship status: Not on file  . Intimate partner violence:    Fear of current or ex partner: Not on file  Emotionally abused: Not on file    Physically abused: Not on file    Forced sexual activity: Not on file  Other Topics Concern  . Not on file  Social History Narrative  . Not on file    Current Outpatient Medications on File Prior to Visit  Medication Sig Dispense Refill  . albuterol (PROVENTIL HFA;VENTOLIN HFA) 108 (90 Base) MCG/ACT inhaler Inhale 2 puffs into the lungs every 6 (six) hours as needed for wheezing or shortness of breath. 1 Inhaler 6  . ALPHA LIPOIC ACID PO Take 1 tablet by mouth daily.    Marland Kitchen aspirin EC 81 MG EC tablet Take 1 tablet (81 mg total) by mouth daily. 90 tablet 4  . atorvastatin (LIPITOR) 80 MG tablet Take 1 tablet (80 mg total) by mouth at bedtime. 90 tablet 3  . b complex vitamins tablet Take 1 tablet by mouth daily.    Marland Kitchen buPROPion (WELLBUTRIN XL) 300 MG 24 hr tablet Take 1 tablet (300 mg  total) by mouth every morning. 90 tablet 3  . Carboxymethylcellul-Glycerin (LUBRICATING EYE DROPS OP) Place 1 drop into both eyes daily as needed (dry eyes).    . Continuous Glucose Monitor Sup (ENLITE GLUCOSE SENSOR) MISC 1 Device by Does not apply route once a week. 13 each 3  . FLUoxetine (PROZAC) 10 MG tablet Take 3 tablets (30 mg total) by mouth daily. 90 tablet 11  . glucose blood (BAYER CONTOUR NEXT TEST) test strip Test blood glucose 2-3 times a day. Dx code: 250.01 200 each 10  . Insulin Infusion Pump Supplies (MINIMED INFUSION SET-MMT 399) MISC See admin instructions. Use with Humulin insulin - continuous - basal rate 0.625    . insulin lispro (HUMALOG) 100 UNIT/ML injection 35 units per insulin pump daily. 40 mL 11  . levothyroxine (SYNTHROID, LEVOTHROID) 112 MCG tablet Take 1 tablet (112 mcg total) by mouth daily before breakfast. 90 tablet 1  . metoprolol succinate (TOPROL XL) 25 MG 24 hr tablet Take 0.5 tablets (12.5 mg total) by mouth daily. 45 tablet 1  . mometasone (NASONEX) 50 MCG/ACT nasal spray Place 2 sprays into the nose daily as needed (seasonal allergies).     . montelukast (SINGULAIR) 10 MG tablet Take 1 tablet (10 mg total) by mouth at bedtime. 90 tablet 3  . nitroGLYCERIN (NITROSTAT) 0.4 MG SL tablet Place 1 tablet (0.4 mg total) under the tongue every 5 (five) minutes as needed for chest pain. 30 tablet 12  . ticagrelor (BRILINTA) 90 MG TABS tablet Take 1 tablet (90 mg total) by mouth 2 (two) times daily. 60 tablet 4   No current facility-administered medications on file prior to visit.     Allergies  Allergen Reactions  . Ciprofloxin Hcl [Ciprofloxacin] Nausea And Vomiting and Other (See Comments)    Severe migraine  . Food Color Red [Red Dye] Diarrhea    Family History  Problem Relation Age of Onset  . Coronary artery disease Father   . Congestive Heart Failure Father   . Cancer Mother        T cell lymphoma    BP 118/68   Pulse 75   Wt 153 lb (69.4 kg)    SpO2 99%   BMI 25.46 kg/m    Review of Systems Denies LOC.     Objective:   Physical Exam VITAL SIGNS:  See vs page GENERAL: no distress Pulses: dorsalis pedis intact bilat.   MSK: no deformity of the feet CV: no leg edema Skin:  no ulcer on the feet.  normal color and temp on the feet. Neuro: sensation is intact to touch on the feet.    A1c=10.8%     Assessment & Plan:  Type 1 DM: worse.  If next a1c is still high, we'll change to no boluses. Missing cbg recording: pt says she'll check.  Patient Instructions  check your blood sugar 8 times a day.  vary the time of day when you check, between before the 3 meals, and at bedtime.  also check if you have symptoms of your blood sugar being too high or too low.  please keep a record of the readings and bring it to your next appointment here.  please call us sooner if your blood sugar goes below 70, or if you have a lot of readings over 200.   Please continue these pump settings:  basal rate of 0.65 units/hr, 24 HRS per day.   mealtime bolus of 1 unit/18 grams of carbohydrate.   correction bolus (which some people call "sensitivity," or "insulin sensitivity ratio," or just "isr") of 1 unit for each 60 by which your glucose exceeds 100.   Please come back for a follow-up appointment in 2 months.

## 2017-06-13 NOTE — Patient Instructions (Addendum)
check your blood sugar 8 times a day.  vary the time of day when you check, between before the 3 meals, and at bedtime.  also check if you have symptoms of your blood sugar being too high or too low.  please keep a record of the readings and bring it to your next appointment here.  please call us sooner if your blood sugar goes below 70, or if you have a lot of readings over 200.   Please continue these pump settings:  basal rate of 0.65 units/hr, 24 HRS per day.   mealtime bolus of 1 unit/18 grams of carbohydrate.   correction bolus (which some people call "sensitivity," or "insulin sensitivity ratio," or just "isr") of 1 unit for each 60 by which your glucose exceeds 100.   Please come back for a follow-up appointment in 2 months.

## 2017-06-21 ENCOUNTER — Ambulatory Visit: Payer: Self-pay | Attending: Nurse Practitioner | Admitting: Physician Assistant

## 2017-06-21 VITALS — BP 117/72 | HR 79 | Temp 99.1°F | Resp 16 | Ht 65.0 in | Wt 152.4 lb

## 2017-06-21 DIAGNOSIS — E119 Type 2 diabetes mellitus without complications: Secondary | ICD-10-CM | POA: Insufficient documentation

## 2017-06-21 DIAGNOSIS — R5383 Other fatigue: Secondary | ICD-10-CM

## 2017-06-21 DIAGNOSIS — Z79899 Other long term (current) drug therapy: Secondary | ICD-10-CM | POA: Insufficient documentation

## 2017-06-21 DIAGNOSIS — Z7982 Long term (current) use of aspirin: Secondary | ICD-10-CM | POA: Insufficient documentation

## 2017-06-21 DIAGNOSIS — F331 Major depressive disorder, recurrent, moderate: Secondary | ICD-10-CM | POA: Insufficient documentation

## 2017-06-21 DIAGNOSIS — Z881 Allergy status to other antibiotic agents status: Secondary | ICD-10-CM | POA: Insufficient documentation

## 2017-06-21 DIAGNOSIS — Z79890 Hormone replacement therapy (postmenopausal): Secondary | ICD-10-CM | POA: Insufficient documentation

## 2017-06-21 DIAGNOSIS — L299 Pruritus, unspecified: Secondary | ICD-10-CM | POA: Insufficient documentation

## 2017-06-21 DIAGNOSIS — Z794 Long term (current) use of insulin: Secondary | ICD-10-CM | POA: Insufficient documentation

## 2017-06-21 DIAGNOSIS — E7800 Pure hypercholesterolemia, unspecified: Secondary | ICD-10-CM | POA: Insufficient documentation

## 2017-06-21 DIAGNOSIS — Z955 Presence of coronary angioplasty implant and graft: Secondary | ICD-10-CM | POA: Insufficient documentation

## 2017-06-21 DIAGNOSIS — I252 Old myocardial infarction: Secondary | ICD-10-CM | POA: Insufficient documentation

## 2017-06-21 DIAGNOSIS — F419 Anxiety disorder, unspecified: Secondary | ICD-10-CM | POA: Insufficient documentation

## 2017-06-21 DIAGNOSIS — E039 Hypothyroidism, unspecified: Secondary | ICD-10-CM | POA: Insufficient documentation

## 2017-06-21 DIAGNOSIS — I2510 Atherosclerotic heart disease of native coronary artery without angina pectoris: Secondary | ICD-10-CM | POA: Insufficient documentation

## 2017-06-21 DIAGNOSIS — E108 Type 1 diabetes mellitus with unspecified complications: Secondary | ICD-10-CM

## 2017-06-21 LAB — GLUCOSE, POCT (MANUAL RESULT ENTRY): POC Glucose: 239 mg/dl — AB (ref 70–99)

## 2017-06-21 MED ORDER — FLUOXETINE HCL 40 MG PO CAPS: 40.0000 mg | ORAL_CAPSULE | Freq: Every day | ORAL | 3 refills | Status: DC

## 2017-06-21 MED FILL — FLUoxetine HCL 40 MG CAPS: 40 | 30 days supply | Qty: 30 | Fill #0

## 2017-06-21 NOTE — Progress Notes (Signed)
Jennifer Hogan, is a 47 y.o. female  MVH:[id number]  WUX:[id number]  DOB - [birth year]  Subjective:  Chief Complaint and HPI: Jennifer Hogan is a 47 y.o. female here today for multiple issues.  She had cardiac stents placed a few months ago.  Feels as though her depression has increased since then. Gets tired more easily than previously.  C/o all over itching.  Taking 2 zyrtec daily.  No rash currently.  +anhedonia.  Depressed mood.  Can't do as much as she used to.  was placed on Brillinta and metoprolol s/p stent.  Last TSH in February and was normal.  No SI/HI.  Has supportive friends. No weapons in the home.  Currently has no insurance so can't afford a Social worker.  Denies CP/SOB/dizziness.    Social History: has PhD.  Still working  ROS:   Constitutional:  No f/c, No night sweats, No unexplained weight loss. EENT:  No vision changes, No blurry vision, No hearing changes. No mouth, throat, or ear problems.  Respiratory: No cough, No SOB Cardiac: No CP, no palpitations GI:  No abd pain, No N/V/D. GU: No Urinary s/sx Musculoskeletal: No joint pain Neuro: No headache, no dizziness, no motor weakness.  Skin: No rash Endocrine:  No polydipsia. No polyuria.  Psych: Denies SI/HI  No problems updated.  ALLERGIES: Allergies  Allergen Reactions  . Ciprofloxin Hcl [Ciprofloxacin] Nausea And Vomiting and Other (See Comments)    Severe migraine  . Food Color Red [Red Dye] Diarrhea    PAST MEDICAL HISTORY: Past Medical History:  Diagnosis Date  . Acute sinusitis 08/07/2015  . Allergic state 08/07/2015  . Anxiety   . Depression   . Diabetes mellitus   . High cholesterol   . Hypothyroidism   . NSTEMI (non-ST elevated myocardial infarction) ([place]) 03/17/2017   s/p DES x 3 LAD, DES OM    MEDICATIONS AT HOME: Prior to Admission medications   Medication Sig Start Date End Date Taking? Authorizing Provider  albuterol (PROVENTIL HFA;VENTOLIN HFA) 108 (90 Base) MCG/ACT inhaler  Inhale 2 puffs into the lungs every 6 (six) hours as needed for wheezing or shortness of breath. 01/17/17  Yes Hoyt Koch, MD  ALPHA LIPOIC ACID PO Take 1 tablet by mouth daily.   Yes [provider]  aspirin EC 81 MG EC tablet Take 1 tablet (81 mg total) by mouth daily. 03/21/17  Yes Rai, Ripudeep K, MD  atorvastatin (LIPITOR) 80 MG tablet Take 1 tablet (80 mg total) by mouth at bedtime. 03/21/17  Yes Rai, Ripudeep K, MD  b complex vitamins tablet Take 1 tablet by mouth daily.   Yes [provider]  buPROPion (WELLBUTRIN XL) 300 MG 24 hr tablet Take 1 tablet (300 mg total) by mouth every morning. 04/30/17 07/29/17 Yes [name], NP  Carboxymethylcellul-Glycerin (LUBRICATING EYE DROPS OP) Place 1 drop into both eyes daily as needed (dry eyes).   Yes [provider]  Continuous Glucose Monitor Sup (ENLITE GLUCOSE SENSOR) MISC 1 Device by Does not apply route once a week. 10/08/14  Yes Renato Shin, MD  glucose blood (BAYER CONTOUR NEXT TEST) test strip Test blood glucose 2-3 times a day. Dx code: 250.01 08/23/12  Yes Renato Shin, MD  Insulin Infusion Pump Supplies (MINIMED INFUSION SET-MMT 399) MISC See admin instructions. Use with Humulin insulin - continuous - basal rate 0.625   Yes [provider]  insulin lispro (HUMALOG) 100 UNIT/ML injection 35 units per insulin pump daily. 04/30/17  Yes Raul Del,  Vernia Buff, NP  levothyroxine (SYNTHROID, LEVOTHROID) 112 MCG tablet Take 1 tablet (112 mcg total) by mouth daily before breakfast. 04/30/17  Yes [name], NP  metoprolol succinate (TOPROL XL) 25 MG 24 hr tablet Take 0.5 tablets (12.5 mg total) by mouth daily. 03/29/17  Yes Simmons, Brittainy M, PA-C  mometasone (NASONEX) 50 MCG/ACT nasal spray Place 2 sprays into the nose daily as needed (seasonal allergies).    Yes [provider]  montelukast (SINGULAIR) 10 MG tablet Take 1 tablet (10 mg total) by mouth at bedtime. 04/30/17  Yes [name], NP   nitroGLYCERIN (NITROSTAT) 0.4 MG SL tablet Place 1 tablet (0.4 mg total) under the tongue every 5 (five) minutes as needed for chest pain. 03/21/17  Yes Rai, Ripudeep K, MD  ticagrelor (BRILINTA) 90 MG TABS tablet Take 1 tablet (90 mg total) by mouth 2 (two) times daily. 03/21/17  Yes Rai, Ripudeep K, MD  FLUoxetine (PROZAC) 40 MG capsule Take 1 capsule (40 mg total) by mouth daily. 06/21/17   Argentina Donovan, PA-C     Objective:  EXAM:   Vitals:   06/21/17 0914  BP: 117/72  Pulse: 79  Resp: 16  Temp: 99.1 F (37.3 C)  TempSrc: Oral  SpO2: 100%  Weight: 152 lb 6.4 oz (69.1 kg)  Height: 5\' 5"  (1.651 m)    General appearance : A&OX3. NAD. Non-toxic-appearing HEENT: Atraumatic and Normocephalic.  PERRLA. EOM intact.   Neck: supple, no JVD. No cervical lymphadenopathy. No thyromegaly Chest/Lungs:  Breathing-non-labored, Good air entry bilaterally, breath sounds normal without rales, rhonchi, or wheezing  CVS: S1 S2 regular, no murmurs, gallops, rubs  Extremities: Bilateral Lower Ext shows no edema, both legs are warm to touch with = pulse throughout Neurology:  CN II-XII grossly intact, Non focal.   Psych:  TP linear. J/I WNL. Pressured speech. Appropriate eye contact and affect.  Skin:  No Rash  Data Review Lab Results  Component Value Date   HGBA1C 10.8 (A) 06/13/2017   HGBA1C 7.9 03/28/2017   HGBA1C 8.0 11/23/2015     Assessment & Plan   1. Fatigue, unspecified type - TSH  2. Type 1 diabetes mellitus with complication (HCC) Seen by endocrinology - Glucose (CBG)  3. Moderate episode of recurrent major depressive disorder (HCC) Increase dose - FLUoxetine (PROZAC) 40 MG capsule; Take 1 capsule (40 mg total) by mouth daily.  Dispense: 90 capsule; Refill: 3 Continue Wellbutrin.  Will give [name] See information and have her schedule an appt  4. Itching Continue zyrtec, add flonase  5. Coronary artery disease involving native coronary artery of native heart  without angina pectoris Sees cardiologist next week-keep appt.  Some of what she is experiencing may be SE of meds/heart condition.    I counseled her on self-care in general.  I spent 40mins face to face counseling on depression/anxiety management techniques.       Patient have been counseled extensively about nutrition and exercise  Return in about 1 month (around 07/22/2017) for appt with [name]; recheck fatigue and depression.  The patient was given clear instructions to go to ER or return to medical center if symptoms don't improve, worsen or new problems develop. The patient verbalized understanding. The patient was told to call to get lab results if they haven't heard anything in the next week.     Freeman Caldron, PA-C [facility] [place], [place]   06/21/2017, 9:49 AMPatient ID: Kayren Eaves  [place], female   DOB: 24-Feb-1970, 47 y.o.   MRN: [id number]

## 2017-06-21 NOTE — Progress Notes (Signed)
Pt. Stated she is worry that she may develop fungal or yeast skin infection.  Pt. Stated she used anti soap from the hospital and thin kit may help her.  Pt. Stated she is also here for depression not wanting to do anything or get up for work.

## 2017-06-22 ENCOUNTER — Institutional Professional Consult (permissible substitution): Payer: Self-pay | Admitting: Licensed Clinical Social Worker

## 2017-06-22 LAB — TSH: TSH: 0.289 u[IU]/mL — ABNORMAL LOW (ref 0.450–4.500)

## 2017-06-23 LAB — SPECIMEN STATUS REPORT

## 2017-06-23 LAB — VITAMIN D 25 HYDROXY (VIT D DEFICIENCY, FRACTURES): VIT D 25 HYDROXY: 33.1 ng/mL (ref 30.0–100.0)

## 2017-06-25 ENCOUNTER — Other Ambulatory Visit: Payer: Self-pay | Admitting: Physician Assistant

## 2017-06-25 ENCOUNTER — Telehealth: Payer: Self-pay

## 2017-06-25 MED ORDER — LEVOTHYROXINE SODIUM 100 MCG PO TABS: 100.0000 ug | ORAL_TABLET | Freq: Every day | ORAL | 1 refills | Status: DC

## 2017-06-25 NOTE — Telephone Encounter (Signed)
-----   Message from Argentina Donovan, [place] sent at 06/25/2017  9:44 AM EDT ----- Your vitamin D level is low normal but normal.  Take 2000 units of vitamin D daily to maintain a normal level.  Your thyroid dose is a little too high, so I am changing the synthroid from 112 mcg to 147mcg daily and sent a new prescription to the pharmacy.  Follow-up as planned.  Thanks,  Freeman Caldron, PA-C

## 2017-06-25 NOTE — Telephone Encounter (Signed)
CMA attempt to call patient.  No answer and left  VM for patient to call back.  If patient call back, please inform:  Your vitamin D level is low normal but normal.  Take 2000 units of vitamin D daily to maintain a normal level.  Your thyroid dose is a little too high, so I am changing the synthroid from 112 mcg to 176mcg daily and sent a new prescription to the pharmacy.  Follow-up as planned.  Thanks,  Freeman Caldron, PA-C

## 2017-06-26 ENCOUNTER — Ambulatory Visit (INDEPENDENT_AMBULATORY_CARE_PROVIDER_SITE_OTHER): Payer: Self-pay | Admitting: Cardiology

## 2017-06-26 ENCOUNTER — Encounter: Payer: Self-pay | Admitting: Cardiology

## 2017-06-26 VITALS — BP 108/54 | HR 72 | Ht 65.0 in | Wt 156.2 lb

## 2017-06-26 DIAGNOSIS — I255 Ischemic cardiomyopathy: Secondary | ICD-10-CM

## 2017-06-26 DIAGNOSIS — E7800 Pure hypercholesterolemia, unspecified: Secondary | ICD-10-CM

## 2017-06-26 DIAGNOSIS — I2510 Atherosclerotic heart disease of native coronary artery without angina pectoris: Secondary | ICD-10-CM

## 2017-06-26 MED ORDER — PANTOPRAZOLE SODIUM 40 MG PO TBEC: 40.0000 mg | DELAYED_RELEASE_TABLET | Freq: Every day | ORAL | 11 refills | Status: DC

## 2017-06-26 MED ORDER — CLOPIDOGREL BISULFATE 75 MG PO TABS: 75.0000 mg | ORAL_TABLET | Freq: Every day | ORAL | 11 refills | Status: DC

## 2017-06-26 MED FILL — ?PANTOPRAZOLE SO DR 40MG TA: 40 | 30 days supply | Qty: 30 | Fill #0

## 2017-06-26 MED FILL — ?CLOPIDOGREL 75MG TAB: 75 | 30 days supply | Qty: 30 | Fill #0

## 2017-06-26 NOTE — Progress Notes (Signed)
Cardiology Office Note:    Date:  06/26/2017   ID:  Jennifer Hogan, DOB 1970-12-02, MRN [id number]  PCP:  [name], NP  Cardiologist:  Fransico Him, MD    Referring MD: Hoyt Koch, *   Chief Complaint  Patient presents with  . Coronary Artery Disease  . Cardiomyopathy  . Hyperlipidemia    History of Present Illness:    Jennifer Hogan is a 47 y.o. female with a hx of type 1 diabetes mellitus on chronic insulin pump, hypothyroidism and ischemic dilated cardiomyopathy EF 45 to 50% with apical hypokinesis.  She has severe 3 vessel ASCAD s/p NSTEMI with cath showing severe three-vessel CAD status post PTCA DES x3 in the LAD(ostial DES placed, mid DES placed, distal DES placed) and PTCA/DES x 1 intermediate branch.She was placed on DAPT w/ ASA and Brilinta, along with high dose statin therapy, Lipitor 80 mg, (LDL elevated at 126 mg/dL).  She had issues with orthostatic hypotension, thus no ACE/ARB was added.   She is here today for followup and is doing well.  She denies any chest pain or pressure has had some problems with dyspnea on exertion ever since her MI and been placed on Brilinta.  She is also noticed a rash over her chest and has been having some problems with nausea as well and is vomiting twice a week.  She denies any PND, orthopnea, LE edema, dizziness, palpitations or syncope.     Past Medical History:  Diagnosis Date  . Acute sinusitis 08/07/2015  . Allergic state 08/07/2015  . Anxiety   . Depression   . Diabetes mellitus   . High cholesterol   . Hypothyroidism   . NSTEMI (non-ST elevated myocardial infarction) ([place]) 03/17/2017   s/p DES x 3 LAD, DES OM    Past Surgical History:  Procedure Laterality Date  . APPENDECTOMY    . CORONARY STENT INTERVENTION N/A 03/20/2017   Procedure: DES x 3 LAD, DES OM; Surgeon: Burnell Blanks, MD;  Location: [facility];  Service: Cardiovascular;  Laterality: N/A;  . LEFT HEART CATH AND  CORONARY ANGIOGRAPHY N/A 03/20/2017   Procedure: LEFT HEART CATH AND CORONARY ANGIOGRAPHY;  Surgeon: Burnell Blanks, MD;  Location: [facility];  Service: Cardiovascular;  Laterality: N/A;    Current Medications: Current Meds  Medication Sig  . albuterol (PROVENTIL HFA;VENTOLIN HFA) 108 (90 Base) MCG/ACT inhaler Inhale 2 puffs into the lungs every 6 (six) hours as needed for wheezing or shortness of breath.  . ALPHA LIPOIC ACID PO Take 1 tablet by mouth daily.  Marland Kitchen aspirin EC 81 MG EC tablet Take 1 tablet (81 mg total) by mouth daily.  Marland Kitchen atorvastatin (LIPITOR) 80 MG tablet Take 1 tablet (80 mg total) by mouth at bedtime.  Marland Kitchen b complex vitamins tablet Take 1 tablet by mouth daily.  Marland Kitchen buPROPion (WELLBUTRIN XL) 300 MG 24 hr tablet Take 1 tablet (300 mg total) by mouth every morning.  . Carboxymethylcellul-Glycerin (LUBRICATING EYE DROPS OP) Place 1 drop into both eyes daily as needed (dry eyes).  . Continuous Glucose Monitor Sup (ENLITE GLUCOSE SENSOR) MISC 1 Device by Does not apply route once a week.  Marland Kitchen FLUoxetine (PROZAC) 40 MG capsule Take 1 capsule (40 mg total) by mouth daily.  Marland Kitchen glucose blood (BAYER CONTOUR NEXT TEST) test strip Test blood glucose 2-3 times a day. Dx code: 250.01  . Insulin Infusion Pump Supplies (MINIMED INFUSION SET-MMT 399) MISC See admin instructions.  Use with Humulin insulin - continuous - basal rate 0.625  . insulin lispro (HUMALOG) 100 UNIT/ML injection 35 units per insulin pump daily.  Marland Kitchen levothyroxine (SYNTHROID) 100 MCG tablet Take 1 tablet (100 mcg total) by mouth daily before breakfast.  . metoprolol succinate (TOPROL XL) 25 MG 24 hr tablet Take 0.5 tablets (12.5 mg total) by mouth daily.  . mometasone (NASONEX) 50 MCG/ACT nasal spray Place 2 sprays into the nose daily as needed (seasonal allergies).   . montelukast (SINGULAIR) 10 MG tablet Take 1 tablet (10 mg total) by mouth at bedtime.  . nitroGLYCERIN (NITROSTAT) 0.4 MG SL tablet Place 1 tablet  (0.4 mg total) under the tongue every 5 (five) minutes as needed for chest pain.  . ticagrelor (BRILINTA) 90 MG TABS tablet Take 1 tablet (90 mg total) by mouth 2 (two) times daily.     Allergies:   Ciprofloxin hcl [ciprofloxacin] and Food color red [red dye]   Social History   Socioeconomic History  . Marital status: Divorced    Spouse name: Not on file  . Number of children: Not on file  . Years of education: Not on file  . Highest education level: Not on file  Occupational History  . Not on file  Social Needs  . Financial resource strain: Not on file  . Food insecurity:    Worry: Not on file    Inability: Not on file  . Transportation needs:    Medical: Not on file    Non-medical: Not on file  Tobacco Use  . Smoking status: Never Smoker  . Smokeless tobacco: Never Used  . Tobacco comment: Use marijuana  Substance and Sexual Activity  . Alcohol use: No    Alcohol/week: 0.0 oz  . Drug use: Yes    Types: Marijuana    Comment: occ  . Sexual activity: Yes    Birth control/protection: None  Lifestyle  . Physical activity:    Days per week: Not on file    Minutes per session: Not on file  . Stress: Not on file  Relationships  . Social connections:    Talks on phone: Not on file    Gets together: Not on file    Attends religious service: Not on file    Active member of club or organization: Not on file    Attends meetings of clubs or organizations: Not on file    Relationship status: Not on file  Other Topics Concern  . Not on file  Social History Narrative  . Not on file     Family History: The patient's family history includes Cancer in her mother; Congestive Heart Failure in her father; Coronary artery disease in her father.  ROS:   Please see the history of present illness.    ROS  All other systems reviewed and negative.   EKGs/Labs/Other Studies Reviewed:    The following studies were reviewed today: Hospital notes  EKG:  EKG is not ordered today.     Recent Labs: 03/28/2017: BUN 11; Creatinine, Ser 1.00; Hemoglobin 12.2; Platelets 410; Potassium 4.6; Sodium 136 05/10/2017: ALT 32 06/21/2017: TSH 0.289   Recent Lipid Panel    Component Value Date/Time   CHOL 155 05/10/2017 0849   TRIG 80 05/10/2017 0849   HDL 73 05/10/2017 0849   CHOLHDL 2.1 05/10/2017 0849   CHOLHDL 3.5 03/19/2017 0522   VLDL 15 03/19/2017 0522   LDLCALC 66 05/10/2017 0849    Physical Exam:    VS:  BP Marland Kitchen)  108/54   Pulse 72   Ht 5\' 5"  (1.651 m)   Wt 156 lb 3.2 oz (70.9 kg)   SpO2 99%   BMI 25.99 kg/m     Wt Readings from Last 3 Encounters:  06/26/17 156 lb 3.2 oz (70.9 kg)  06/21/17 152 lb 6.4 oz (69.1 kg)  06/13/17 153 lb (69.4 kg)     GEN:  Well nourished, well developed in no acute distress HEENT: Normal NECK: No JVD; No carotid bruits LYMPHATICS: No lymphadenopathy CARDIAC: RRR, no murmurs, rubs, gallops RESPIRATORY:  Clear to auscultation without rales, wheezing or rhonchi  ABDOMEN: Soft, non-tender, non-distended MUSCULOSKELETAL:  No edema; No deformity  SKIN: Warm and dry NEUROLOGIC:  Alert and oriented x 3 PSYCHIATRIC:  Normal affect   ASSESSMENT:    1. Coronary artery disease involving native coronary artery of native heart without angina pectoris   2. Ischemic cardiomyopathy   3. Pure hypercholesterolemia    PLAN:    In order of problems listed above:  1.  ASCAD - s/p NSTEMI with cath showing severe three-vessel CAD status post PTCA DES x3 in the LAD(ostial DES placed, mid DES placed, distal DES placed) and PTCA/DES x 1 intermediate branch.  She denies any anginal symptoms.  She is some problems with shortness of breath which she has had ever since her MI and is likely related to the [place].  She is also been having some nausea with occasional vomiting which is likely related to eating DAPT.Marland Kitchen  She will continue  with aspirin 81 mg daily and 9 going to change her Brilinta to Plavix.  I have instructed her to take her evening dose  of Brilinta tonight and then starting tomorrow take Plavix 75 mg 2 tablets on 6/5 and then starting 6/6 take 1 tablet daily.  I will have her follow-up with my PA in 2 weeks to make sure that her symptoms have improved.  I am also going to start her on Protonix 40 mg daily for GI prophylaxis while on DAPT therapy.  She will continue on statin and low-dose beta-blocker therapy.  2.  Ischemic DCM - EF 45 to 50% by 2D echocardiogram 03/16/2017.  She will continue on Toprol XL 12.5 mg daily.  BP has been too soft to add for addition of ACE inhibitor.  3.  Hyperlipidemia with LDL goal < 70.  She will continue on atorvastatin 80 mg daily.  Repeat LDL 05/11/2007 showed LDL at goal at 66.   Medication Adjustments/Labs and Tests Ordered: Current medicines are reviewed at length with the patient today.  Concerns regarding medicines are outlined above.  No orders of the defined types were placed in this encounter.  No orders of the defined types were placed in this encounter.   Signed, Fransico Him, MD  06/26/2017 10:43 AM    [place]

## 2017-06-26 NOTE — Patient Instructions (Addendum)
Medication Instructions:  Your physician has recommended you make the following change in your medication:  STOP: Brilinta after taking your dose tonight  START: Plavix 150 mg (2 tablets) once tomorrow morning 06/27/17  THEN on 06/28/17 take Plavix 75 mg (1 tablet) once a day   START: Protonix 40 mg once a day   If you need a refill on your cardiac medications, please contact your pharmacy first.  Labwork: None ordered   Testing/Procedures: None ordered   Follow-Up: Your physician recommends that you schedule a follow-up appointment in: 2-3 weeks with PA  Your physician wants you to follow-up in: 6 months with Dr. Radford Pax. You will receive a reminder letter in the mail two months in advance. If you don't receive a letter, please call our office to schedule the follow-up appointment.  Any Other Special Instructions Will Be Listed Below (If Applicable).   Thank you for choosing [facility], RN  [phone]  If you need a refill on your cardiac medications before your next appointment, please call your pharmacy.

## 2017-06-27 ENCOUNTER — Ambulatory Visit: Payer: Self-pay | Admitting: Cardiology

## 2017-06-28 ENCOUNTER — Ambulatory Visit
Admission: RE | Admit: 2017-06-28 | Discharge: 2017-06-28 | Disposition: A | Discharge status: Home / Self Care | Payer: No Typology Code available for payment source | Source: Ambulatory Visit | Attending: Obstetrics and Gynecology | Admitting: Obstetrics and Gynecology

## 2017-06-28 ENCOUNTER — Encounter (HOSPITAL_COMMUNITY): Payer: Self-pay | Admitting: *Deleted

## 2017-06-28 ENCOUNTER — Encounter (HOSPITAL_COMMUNITY): Payer: Self-pay

## 2017-06-28 ENCOUNTER — Ambulatory Visit (HOSPITAL_COMMUNITY)
Admission: RE | Admit: 2017-06-28 | Discharge: 2017-06-28 | Disposition: A | Discharge status: Home / Self Care | Payer: Self-pay | Source: Ambulatory Visit | Attending: Obstetrics and Gynecology | Admitting: Obstetrics and Gynecology

## 2017-06-28 VITALS — BP 100/62 | Ht 65.0 in

## 2017-06-28 DIAGNOSIS — Z1239 Encounter for other screening for malignant neoplasm of breast: Secondary | ICD-10-CM

## 2017-06-28 DIAGNOSIS — Z1231 Encounter for screening mammogram for malignant neoplasm of breast: Secondary | ICD-10-CM

## 2017-06-28 NOTE — Patient Instructions (Signed)
Explained breast self awareness with Jennifer Hogan. Patient did not need a Pap smear today due to last Pap smear and HPV typing was 10/06/2015. Let her know B[facility]CP will cover Pap smears and HPV typing every 5 years unless has a history of abnormal Pap smears. Referred patient to the [facility] for a screening mammogram. Appointment scheduled for Thursday, June 28, 2017 at 1410. Let [facility] know the [facility] will follow up with her within the next couple weeks with results of mammogram by letter or phone. [place] verbalized understanding.  [place], Arvil Chaco, RN 2:04 PM

## 2017-06-28 NOTE — Progress Notes (Signed)
No complaints today.   Pap Smear: Pap smear not completed today. Last Pap smear was 10/06/2015 at [facility] and normal with negative HPV. Per patient has no history of an abnormal Pap smear. Last Pap smear result is in Epic.  Physical exam: Breasts Breasts symmetrical. No skin abnormalities bilateral breasts. No nipple retraction bilateral breasts. No nipple discharge bilateral breasts. No lymphadenopathy. No lumps palpated bilateral breasts. No complaints of pain or tenderness on exam. Referred patient to the [facility] for a screening mammogram. Appointment scheduled for Thursday, June 28, 2017 at 1410.        Pelvic/Bimanual No Pap smear completed today since last Pap smear and HPV typing was 10/06/2015. Pap smear not indicated per BCCCP guidelines.   Smoking History: Patient has never smoked.  Patient Navigation: Patient education provided. Access to services provided for patient through B[facility]CP program.   Breast and Cervical Cancer Risk Assessment: Patient has no family history of breast cancer, known genetic mutations, or radiation treatment to the chest before age 20. Patient has no history of cervical dysplasia, immunocompromised, or DES exposure in-utero. Patient has a 5-year risk for breast cancer at 1% and a lifetime risk at 10.2%.

## 2017-07-16 ENCOUNTER — Ambulatory Visit (INDEPENDENT_AMBULATORY_CARE_PROVIDER_SITE_OTHER): Payer: Self-pay | Admitting: Cardiology

## 2017-07-16 ENCOUNTER — Encounter: Payer: Self-pay | Admitting: Cardiology

## 2017-07-16 VITALS — BP 112/58 | HR 92 | Ht 65.0 in | Wt 147.8 lb

## 2017-07-16 DIAGNOSIS — E7800 Pure hypercholesterolemia, unspecified: Secondary | ICD-10-CM

## 2017-07-16 DIAGNOSIS — I2510 Atherosclerotic heart disease of native coronary artery without angina pectoris: Secondary | ICD-10-CM

## 2017-07-16 DIAGNOSIS — I255 Ischemic cardiomyopathy: Secondary | ICD-10-CM

## 2017-07-16 NOTE — Progress Notes (Signed)
Cardiology Office Note:    Date:  07/16/2017   ID:  Jennifer Hogan, DOB 04/05/1970, MRN [id number]  PCP:  [name], NP  Cardiologist:  Fransico Him, MD    Referring MD: [name], NP   Chief Complaint  Patient presents with  . Coronary Artery Disease    History of Present Illness:    Jennifer Hogan is a 47 y.o. female with a hx of  type 1 diabetes mellitus on chronic insulin pump, hypothyroidism and ischemic dilated cardiomyopathy EF 45 to 50% with apical hypokinesis.  She has severe 3 vessel ASCAD s/p NSTEMI with cath showing severe three-vessel CAD status post PTCA DES x3 in the LAD(ostial DES placed, mid DES placed, distal DES placed) and PTCA/DES x 1 intermediate branch.She was placed on DAPT w/ ASA and Brilinta, along with high dose statin therapy, Lipitor 80 mg, (LDLelevated at 126 mg/dL).  She had issues with orthostatic hypotension, thus no ACE/ARB was added.   I saw her several weeks ago and she was complaining of nausea and occasional vomiting that I thought was probably related to her dual antiplatelet therapy.  She was also continuing to have shortness of breath and her Brilinta was changed to Plavix 75 mg daily.  She was also started on Protonix 40 mg daily.  She is now back for follow-up.She is here today for followup and is doing well.  She denies any chest pain or pressure, SOB, DOE, PND, orthopnea, LE edema, dizziness, palpitations or syncope. She is compliant with her meds and is tolerating meds with no SE. her shortness of breath is completely resolved she has had no chest pain.  Her nausea and vomiting also completely resolved.   Past Medical History:  Diagnosis Date  . Acute sinusitis 08/07/2015  . Allergic state 08/07/2015  . Anxiety   . Depression   . Diabetes mellitus   . High cholesterol   . Hypothyroidism   . NSTEMI (non-ST elevated myocardial infarction) ([place]) 03/17/2017   s/p DES x 3 LAD, DES OM    Past Surgical History:    Procedure Laterality Date  . APPENDECTOMY    . CORONARY STENT INTERVENTION N/A 03/20/2017   Procedure: DES x 3 LAD, DES OM; Surgeon: Burnell Blanks, MD;  Location: [facility];  Service: Cardiovascular;  Laterality: N/A;  . LEFT HEART CATH AND CORONARY ANGIOGRAPHY N/A 03/20/2017   Procedure: LEFT HEART CATH AND CORONARY ANGIOGRAPHY;  Surgeon: Burnell Blanks, MD;  Location: [facility];  Service: Cardiovascular;  Laterality: N/A;    Current Medications: Current Meds  Medication Sig  . albuterol (PROVENTIL HFA;VENTOLIN HFA) 108 (90 Base) MCG/ACT inhaler Inhale 2 puffs into the lungs every 6 (six) hours as needed for wheezing or shortness of breath.  . ALPHA LIPOIC ACID PO Take 1 tablet by mouth daily.  Marland Kitchen aspirin EC 81 MG EC tablet Take 1 tablet (81 mg total) by mouth daily.  Marland Kitchen atorvastatin (LIPITOR) 80 MG tablet Take 1 tablet (80 mg total) by mouth at bedtime.  Marland Kitchen b complex vitamins tablet Take 1 tablet by mouth daily.  Marland Kitchen buPROPion (WELLBUTRIN XL) 300 MG 24 hr tablet Take 1 tablet (300 mg total) by mouth every morning.  . Carboxymethylcellul-Glycerin (LUBRICATING EYE DROPS OP) Place 1 drop into both eyes daily as needed (dry eyes).  . clopidogrel (PLAVIX) 75 MG tablet Take 1 tablet (75 mg total) by mouth daily.  . Continuous Glucose Monitor Sup (ENLITE GLUCOSE SENSOR) MISC  1 Device by Does not apply route once a week.  Marland Kitchen FLUoxetine (PROZAC) 40 MG capsule Take 1 capsule (40 mg total) by mouth daily.  Marland Kitchen glucose blood (BAYER CONTOUR NEXT TEST) test strip Test blood glucose 2-3 times a day. Dx code: 250.01  . Insulin Infusion Pump Supplies (MINIMED INFUSION SET-MMT 399) MISC See admin instructions. Use with Humulin insulin - continuous - basal rate 0.625  . insulin lispro (HUMALOG) 100 UNIT/ML injection 35 units per insulin pump daily.  Marland Kitchen levothyroxine (SYNTHROID) 100 MCG tablet Take 1 tablet (100 mcg total) by mouth daily before breakfast.  . metoprolol succinate  (TOPROL XL) 25 MG 24 hr tablet Take 0.5 tablets (12.5 mg total) by mouth daily.  . mometasone (NASONEX) 50 MCG/ACT nasal spray Place 2 sprays into the nose daily as needed (seasonal allergies).   . montelukast (SINGULAIR) 10 MG tablet Take 1 tablet (10 mg total) by mouth at bedtime.  . nitroGLYCERIN (NITROSTAT) 0.4 MG SL tablet Place 1 tablet (0.4 mg total) under the tongue every 5 (five) minutes as needed for chest pain.  . pantoprazole (PROTONIX) 40 MG tablet Take 1 tablet (40 mg total) by mouth daily.     Allergies:   Ciprofloxin hcl [ciprofloxacin] and Food color red [red dye]   Social History   Socioeconomic History  . Marital status: Divorced    Spouse name: Not on file  . Number of children: Not on file  . Years of education: Not on file  . Highest education level: Not on file  Occupational History  . Not on file  Social Needs  . Financial resource strain: Not on file  . Food insecurity:    Worry: Not on file    Inability: Not on file  . Transportation needs:    Medical: Not on file    Non-medical: Not on file  Tobacco Use  . Smoking status: Never Smoker  . Smokeless tobacco: Never Used  . Tobacco comment: Use marijuana  Substance and Sexual Activity  . Alcohol use: No    Alcohol/week: 0.0 oz  . Drug use: Yes    Types: Marijuana    Comment: occ  . Sexual activity: Not Currently    Birth control/protection: None  Lifestyle  . Physical activity:    Days per week: Not on file    Minutes per session: Not on file  . Stress: Not on file  Relationships  . Social connections:    Talks on phone: Not on file    Gets together: Not on file    Attends religious service: Not on file    Active member of club or organization: Not on file    Attends meetings of clubs or organizations: Not on file    Relationship status: Not on file  Other Topics Concern  . Not on file  Social History Narrative  . Not on file     Family History: The patient's family history includes  Asthma in her father; Cancer in her mother; Congestive Heart Failure in her father; Coronary artery disease in her father.  ROS:   Please see the history of present illness.    ROS  All other systems reviewed and negative.   EKGs/Labs/Other Studies Reviewed:    The following studies were reviewed today: none  EKG:  EKG is not ordered today.    Recent Labs: 03/28/2017: BUN 11; Creatinine, Ser 1.00; Hemoglobin 12.2; Platelets 410; Potassium 4.6; Sodium 136 05/10/2017: ALT 32 06/21/2017: TSH 0.289   Recent Lipid  Panel    Component Value Date/Time   CHOL 155 05/10/2017 0849   TRIG 80 05/10/2017 0849   HDL 73 05/10/2017 0849   CHOLHDL 2.1 05/10/2017 0849   CHOLHDL 3.5 03/19/2017 0522   VLDL 15 03/19/2017 0522   LDLCALC 66 05/10/2017 0849    Physical Exam:    VS:  BP (!) 112/58   Pulse 92   Ht 5\' 5"  (1.651 m)   Wt 147 lb 12.8 oz (67 kg)   LMP 06/16/2017 (Exact Date)   SpO2 99%   BMI 24.60 kg/m     Wt Readings from Last 3 Encounters:  07/16/17 147 lb 12.8 oz (67 kg)  06/26/17 156 lb 3.2 oz (70.9 kg)  06/21/17 152 lb 6.4 oz (69.1 kg)     GEN:  Well nourished, well developed in no acute distress HEENT: Normal NECK: No JVD; No carotid bruits LYMPHATICS: No lymphadenopathy CARDIAC: RRR, no murmurs, rubs, gallops RESPIRATORY:  Clear to auscultation without rales, wheezing or rhonchi  ABDOMEN: Soft, non-tender, non-distended MUSCULOSKELETAL:  No edema; No deformity  SKIN: Warm and dry NEUROLOGIC:  Alert and oriented x 3 PSYCHIATRIC:  Normal affect   ASSESSMENT:    1. Coronary artery disease involving native coronary artery of native heart without angina pectoris   2. Ischemic cardiomyopathy   3. Pure hypercholesterolemia    PLAN:    In order of problems listed above:  1. ASCAD - s/p NSTEMI with cath showing severe three-vessel CAD status post PTCA DES x3 in the LAD(ostial DES placed, mid DES placed, distal DES placed) and PTCA/DES x 1 intermediate branch.  She  has not had any further anginal sx.  Her SOB resolved after changing from Brilinta to Plavix.  Her N/V have improved with addition of Protonix. She will continue on ASA/Plavix, BB and statin.   2.  Ischemic DCM - 45-50% by echo 02/2017.  Continue BB.  Her BP has been too soft for ACE I in the past.   3.  Hyperlipidemia - LDL goal < 70.  She will continue on atorvastatin 80mg  daily.  Her DLP was 71 on 06/04/2017.  ALT normal at 16.   Medication Adjustments/Labs and Tests Ordered: Current medicines are reviewed at length with the patient today.  Concerns regarding medicines are outlined above.  No orders of the defined types were placed in this encounter.  No orders of the defined types were placed in this encounter.   Signed, Fransico Him, MD  07/16/2017 12:14 PM    [place]

## 2017-07-16 NOTE — Patient Instructions (Signed)
Medication Instructions:  Your physician recommends that you continue on your current medications as directed. Please refer to the Current Medication list given to you today.  If you need a refill on your cardiac medications, please contact your pharmacy first.  Labwork: None ordered   Testing/Procedures: None ordered   Follow-Up: Your physician wants you to follow-up in: 6 months with Dr. Turner. You will receive a reminder letter in the mail two months in advance. If you don't receive a letter, please call our office to schedule the follow-up appointment.  Any Other Special Instructions Will Be Listed Below (If Applicable).   Thank you for choosing [facility]    Rena Susana Gripp, RN  [phone]  If you need a refill on your cardiac medications before your next appointment, please call your pharmacy.   

## 2017-07-23 ENCOUNTER — Ambulatory Visit: Payer: Self-pay | Attending: Nurse Practitioner | Admitting: Nurse Practitioner

## 2017-07-23 ENCOUNTER — Encounter: Payer: Self-pay | Admitting: Nurse Practitioner

## 2017-07-23 VITALS — BP 97/67 | HR 70 | Temp 98.7°F | Ht 65.0 in | Wt 152.4 lb

## 2017-07-23 DIAGNOSIS — Z794 Long term (current) use of insulin: Secondary | ICD-10-CM | POA: Insufficient documentation

## 2017-07-23 DIAGNOSIS — E108 Type 1 diabetes mellitus with unspecified complications: Secondary | ICD-10-CM | POA: Insufficient documentation

## 2017-07-23 DIAGNOSIS — Z881 Allergy status to other antibiotic agents status: Secondary | ICD-10-CM | POA: Insufficient documentation

## 2017-07-23 DIAGNOSIS — Z7982 Long term (current) use of aspirin: Secondary | ICD-10-CM | POA: Insufficient documentation

## 2017-07-23 DIAGNOSIS — E039 Hypothyroidism, unspecified: Secondary | ICD-10-CM | POA: Insufficient documentation

## 2017-07-23 DIAGNOSIS — Z79899 Other long term (current) drug therapy: Secondary | ICD-10-CM | POA: Insufficient documentation

## 2017-07-23 DIAGNOSIS — F331 Major depressive disorder, recurrent, moderate: Secondary | ICD-10-CM | POA: Insufficient documentation

## 2017-07-23 DIAGNOSIS — Z955 Presence of coronary angioplasty implant and graft: Secondary | ICD-10-CM | POA: Insufficient documentation

## 2017-07-23 DIAGNOSIS — Z8249 Family history of ischemic heart disease and other diseases of the circulatory system: Secondary | ICD-10-CM | POA: Insufficient documentation

## 2017-07-23 DIAGNOSIS — I252 Old myocardial infarction: Secondary | ICD-10-CM | POA: Insufficient documentation

## 2017-07-23 DIAGNOSIS — Z9889 Other specified postprocedural states: Secondary | ICD-10-CM | POA: Insufficient documentation

## 2017-07-23 LAB — GLUCOSE, POCT (MANUAL RESULT ENTRY): POC GLUCOSE: 86 mg/dL (ref 70–99)

## 2017-07-23 MED ORDER — FLUOXETINE HCL 40 MG PO CAPS: 40.0000 mg | ORAL_CAPSULE | Freq: Every day | ORAL | 3 refills | Status: DC

## 2017-07-23 MED ORDER — FLUOXETINE HCL 10 MG PO CAPS: 10.0000 mg | ORAL_CAPSULE | Freq: Every day | ORAL | 3 refills | Status: DC

## 2017-07-23 MED FILL — FLUoxetine HCL 40 MG CAPS: 40 | 30 days supply | Qty: 30 | Fill #1 | Status: TO

## 2017-07-23 MED FILL — ATORVASTATIN 80 MG TABLET: 80 | 30 days supply | Qty: 30 | Fill #2 | Status: TO

## 2017-07-23 MED FILL — METOPROLOL SUCCINATE ER 25: 25 | 30 days supply | Qty: 15 | Fill #1 | Status: TO

## 2017-07-23 MED FILL — !HUMALOG 100 UNITS/ML VIAL: 100 | 28 days supply | Qty: 10 | Fill #2 | Status: TO

## 2017-07-23 MED FILL — ?CLOPIDOGREL 75MG TAB: 75 | 30 days supply | Qty: 30 | Fill #1 | Status: TO

## 2017-07-23 MED FILL — !VENTOLIN HFA INHALER: 108 (90 BAS | 25 days supply | Qty: 18 | Fill #1 | Status: TO

## 2017-07-23 MED FILL — ?PANTOPRAZOLE SO DR 40MG TA: 40 | 30 days supply | Qty: 30 | Fill #1 | Status: TO

## 2017-07-23 MED FILL — ?MONTELUKAST SODI 10MG TAB: 10 | 30 days supply | Qty: 30 | Fill #2 | Status: TO

## 2017-07-23 MED FILL — LEVOTHYROXINE 112 MCG TAB: 112 | 30 days supply | Qty: 30 | Fill #2 | Status: TO

## 2017-07-23 NOTE — Progress Notes (Signed)
Assessment & Plan:  Jennifer Hogan was seen today for follow-up.  Diagnoses and all orders for this visit:  Moderate episode of recurrent major depressive disorder (HCC) -     FLUoxetine (PROZAC) 40 MG capsule; Take 1 capsule (40 mg total) by mouth daily. -     FLUoxetine (PROZAC) 10 MG capsule; Take 1 capsule (10 mg total) by mouth daily.  Type 1 diabetes mellitus with complication (HCC) -     Glucose (CBG) Continue blood sugar control as discussed in office today, low carbohydrate diet, and regular physical exercise as tolerated, 150 minutes per week (30 min each day, 5 days per week, or 50 min 3 days per week). Keep blood sugar logs with fasting goal of 80-130 mg/dl, post prandial less than 180.  For Hypoglycemia: BS <60 and Hyperglycemia BS >400; contact the clinic ASAP. Annual eye exams and foot exams are recommended.    Patient has been counseled on age-appropriate routine health concerns for screening and prevention. These are reviewed and up-to-date. Referrals have been placed accordingly. Immunizations are up-to-date or declined.    Subjective:   Chief Complaint  Patient presents with  . Follow-up    Pt. is here follow-up on depression and fatigue. Pt. stated she think her depression is due to not having a full time job. P.t stated she still feel mentally foggy.    HPI Jennifer Hogan 47 y.o. female presents to office today for follow up to depression.  Depression Currently taking Prozac 50mg  daily. She is no longer taking Wellbutrin. Endorses mood stability and decreased anhedonia. She has not applied for financial assistance and does not currently see a counselor or psychiatrist. PHQ9 has decreased from 22 to 52. She currently denies any suicidal ideation.  Depression screen PHQ 2/9 07/23/2017  Decreased Interest 1  Down, Depressed, Hopeless 1  PHQ - 2 Score 2  Altered sleeping 1  Tired, decreased energy 1  Change in appetite 0  Feeling bad or failure about yourself  2    Trouble concentrating 1  Moving slowly or fidgety/restless 0  Suicidal thoughts 0  PHQ-9 Score 7    DM TYPE 1 Chronic. Not well controlled.  She is currently seeing Endocrinology. Denies any hyperglycemic or hypoglycemic symptoms.   Review of Systems  Constitutional: Negative for fever, malaise/fatigue and weight loss.  Respiratory: Negative.  Negative for cough and shortness of breath.   Cardiovascular: Negative.  Negative for chest pain, palpitations and leg swelling.  Gastrointestinal: Negative.  Negative for heartburn, nausea and vomiting.  Neurological: Negative.  Negative for dizziness, focal weakness, seizures and headaches.  Endo/Heme/Allergies: Negative for environmental allergies.  Psychiatric/Behavioral: Positive for depression. Negative for suicidal ideas. The patient is nervous/anxious.     Past Medical History:  Diagnosis Date  . Acute sinusitis 08/07/2015  . Allergic state 08/07/2015  . Anxiety   . Depression   . Diabetes mellitus   . High cholesterol   . Hypothyroidism   . NSTEMI (non-ST elevated myocardial infarction) ([place]) 03/17/2017   s/p DES x 3 LAD, DES OM    Past Surgical History:  Procedure Laterality Date  . APPENDECTOMY    . CORONARY STENT INTERVENTION N/A 03/20/2017   Procedure: DES x 3 LAD, DES OM; Surgeon: Burnell Blanks, MD;  Location: [facility];  Service: Cardiovascular;  Laterality: N/A;  . LEFT HEART CATH AND CORONARY ANGIOGRAPHY N/A 03/20/2017   Procedure: LEFT HEART CATH AND CORONARY ANGIOGRAPHY;  Surgeon: Burnell Blanks, MD;  Location:  [facility];  Service: Cardiovascular;  Laterality: N/A;    Family History  Problem Relation Age of Onset  . Coronary artery disease Father   . Congestive Heart Failure Father   . Asthma Father   . Cancer Mother        T cell lymphoma    Social History Reviewed with no changes to be made today.   Outpatient Medications Prior to Visit  Medication Sig Dispense Refill   . albuterol (PROVENTIL HFA;VENTOLIN HFA) 108 (90 Base) MCG/ACT inhaler Inhale 2 puffs into the lungs every 6 (six) hours as needed for wheezing or shortness of breath. 1 Inhaler 6  . ALPHA LIPOIC ACID PO Take 1 tablet by mouth daily.    Marland Kitchen aspirin EC 81 MG EC tablet Take 1 tablet (81 mg total) by mouth daily. 90 tablet 4  . atorvastatin (LIPITOR) 80 MG tablet Take 1 tablet (80 mg total) by mouth at bedtime. 90 tablet 3  . b complex vitamins tablet Take 1 tablet by mouth daily.    . Carboxymethylcellul-Glycerin (LUBRICATING EYE DROPS OP) Place 1 drop into both eyes daily as needed (dry eyes).    . clopidogrel (PLAVIX) 75 MG tablet Take 1 tablet (75 mg total) by mouth daily. 30 tablet 11  . Continuous Glucose Monitor Sup (ENLITE GLUCOSE SENSOR) MISC 1 Device by Does not apply route once a week. 13 each 3  . glucose blood (BAYER CONTOUR NEXT TEST) test strip Test blood glucose 2-3 times a day. Dx code: 250.01 200 each 10  . Insulin Infusion Pump Supplies (MINIMED INFUSION SET-MMT 399) MISC See admin instructions. Use with Humulin insulin - continuous - basal rate 0.625    . insulin lispro (HUMALOG) 100 UNIT/ML injection 35 units per insulin pump daily. 40 mL 11  . levothyroxine (SYNTHROID) 100 MCG tablet Take 1 tablet (100 mcg total) by mouth daily before breakfast. 90 tablet 1  . metoprolol succinate (TOPROL XL) 25 MG 24 hr tablet Take 0.5 tablets (12.5 mg total) by mouth daily. 45 tablet 1  . mometasone (NASONEX) 50 MCG/ACT nasal spray Place 2 sprays into the nose daily as needed (seasonal allergies).     . montelukast (SINGULAIR) 10 MG tablet Take 1 tablet (10 mg total) by mouth at bedtime. 90 tablet 3  . nitroGLYCERIN (NITROSTAT) 0.4 MG SL tablet Place 1 tablet (0.4 mg total) under the tongue every 5 (five) minutes as needed for chest pain. 30 tablet 12  . pantoprazole (PROTONIX) 40 MG tablet Take 1 tablet (40 mg total) by mouth daily. 30 tablet 11  . FLUoxetine (PROZAC) 40 MG capsule Take 1  capsule (40 mg total) by mouth daily. 90 capsule 3  . buPROPion (WELLBUTRIN XL) 300 MG 24 hr tablet Take 1 tablet (300 mg total) by mouth every morning. (Patient not taking: Reported on 07/23/2017) 90 tablet 3   No facility-administered medications prior to visit.     Allergies  Allergen Reactions  . Ciprofloxin Hcl [Ciprofloxacin] Nausea And Vomiting and Other (See Comments)    Severe migraine  . Food Color Red [Red Dye] Diarrhea       Objective:    BP 97/67 (BP Location: Right Arm, Patient Position: Sitting, Cuff Size: Normal)   Pulse 70   Temp 98.7 F (37.1 C) (Oral)   Ht 5\' 5"  (1.651 m)   Wt 152 lb 6.4 oz (69.1 kg)   SpO2 100%   BMI 25.36 kg/m  Wt Readings from Last 3 Encounters:  07/23/17  152 lb 6.4 oz (69.1 kg)  07/16/17 147 lb 12.8 oz (67 kg)  06/26/17 156 lb 3.2 oz (70.9 kg)    Physical Exam  Constitutional: She is oriented to person, place, and time. She appears well-developed and well-nourished. She is cooperative.  HENT:  Head: Normocephalic and atraumatic.  Cardiovascular: Normal rate, regular rhythm, normal heart sounds and intact distal pulses. Exam reveals no gallop and no friction rub.  No murmur heard. Pulmonary/Chest: Effort normal and breath sounds normal. No tachypnea. No respiratory distress. She has no decreased breath sounds. She has no wheezes. She has no rhonchi. She has no rales. She exhibits no tenderness.  Abdominal: Bowel sounds are normal.  Musculoskeletal: Normal range of motion. She exhibits no edema.  Neurological: She is alert and oriented to person, place, and time. Coordination normal.  Skin: Skin is warm and dry.  Psychiatric: She has a normal mood and affect. Her speech is normal and behavior is normal. Judgment and thought content normal. Cognition and memory are normal.  Nursing note and vitals reviewed.     Patient has been counseled extensively about nutrition and exercise as well as the importance of adherence with medications and  regular follow-up. The patient was given clear instructions to go to ER or return to medical center if symptoms don't improve, worsen or new problems develop. The patient verbalized understanding.   Follow-up: Return in about 4 months (around 11/23/2017).   [name], FNP-BC [facility] and [facility] [place], [place]   07/28/2017, 7:39 PM

## 2017-07-23 NOTE — Progress Notes (Signed)
.  hem

## 2017-07-23 NOTE — Patient Instructions (Signed)

## 2017-07-24 MED FILL — FLUoxetine HCL 10 MG CAPS: 10 | 30 days supply | Qty: 30 | Fill #0 | Status: TO

## 2017-07-28 ENCOUNTER — Encounter: Payer: Self-pay | Admitting: Nurse Practitioner

## 2017-08-06 ENCOUNTER — Encounter: Payer: Self-pay | Admitting: Nurse Practitioner

## 2017-08-14 ENCOUNTER — Ambulatory Visit: Payer: Self-pay | Admitting: Endocrinology

## 2017-08-30 MED FILL — !HUMALOG 100 UNITS/ML VIAL: 100 | 56 days supply | Qty: 20 | Fill #0

## 2017-09-25 ENCOUNTER — Other Ambulatory Visit: Payer: Self-pay | Admitting: Pharmacist

## 2017-09-25 MED ORDER — INSULIN ASPART 100 UNIT/ML ~~LOC~~ SOLN: SUBCUTANEOUS | 11 refills | Status: DC

## 2017-10-04 ENCOUNTER — Other Ambulatory Visit: Payer: Self-pay

## 2017-10-04 MED ORDER — LEVOTHYROXINE SODIUM 100 MCG PO TABS: 100.0000 ug | ORAL_TABLET | Freq: Every day | ORAL | 1 refills | Status: DC

## 2017-10-08 ENCOUNTER — Other Ambulatory Visit: Payer: Self-pay

## 2017-10-08 MED ORDER — PANTOPRAZOLE SODIUM 40 MG PO TBEC: 40.0000 mg | DELAYED_RELEASE_TABLET | Freq: Every day | ORAL | 2 refills | Status: DC

## 2017-10-08 MED ORDER — NITROGLYCERIN 0.4 MG SL SUBL: 0.4000 mg | SUBLINGUAL_TABLET | SUBLINGUAL | 12 refills | Status: DC | PRN

## 2017-10-08 MED ORDER — METOPROLOL SUCCINATE ER 25 MG PO TB24: 12.5000 mg | ORAL_TABLET | Freq: Every day | ORAL | 2 refills | Status: DC

## 2017-10-08 MED ORDER — CLOPIDOGREL BISULFATE 75 MG PO TABS: 75.0000 mg | ORAL_TABLET | Freq: Every day | ORAL | 2 refills | Status: DC

## 2017-10-17 ENCOUNTER — Telehealth: Payer: Self-pay | Admitting: Endocrinology

## 2017-10-17 NOTE — Telephone Encounter (Signed)
According to pt chart refill was sent on 09/25/17 for novolog 100 with 11 refills, please have pt contact her pharmacy

## 2017-10-17 NOTE — Telephone Encounter (Signed)
Jennifer Hogan called [facility] stating "She needs a refill on her insulin. She needs Novolog, with her new job all they will cover on her insurance, they will not over humolog which is what she normally gets. States she will call the office for appt. Tomorrow, but she will need more insulin tomorrow as she will run out. [facility] on [address] [phone]" Ph # [phone]

## 2017-10-30 ENCOUNTER — Telehealth: Payer: Self-pay | Admitting: Endocrinology

## 2017-10-30 NOTE — Telephone Encounter (Signed)
It looks like the Novolog prescription was sent to [facility]. Pt prefers [facility] on [address]. Please advise pt.

## 2017-10-31 ENCOUNTER — Other Ambulatory Visit: Payer: Self-pay

## 2017-10-31 MED ORDER — INSULIN ASPART 100 UNIT/ML ~~LOC~~ SOLN: SUBCUTANEOUS | 11 refills | Status: DC

## 2017-10-31 NOTE — Telephone Encounter (Signed)
Refill has been sent to requested pharmacy. 

## 2017-11-23 ENCOUNTER — Ambulatory Visit: Payer: No Typology Code available for payment source | Admitting: Nurse Practitioner

## 2017-12-03 ENCOUNTER — Other Ambulatory Visit: Payer: Self-pay

## 2017-12-03 ENCOUNTER — Emergency Department (HOSPITAL_COMMUNITY)
Admission: EM | Admit: 2017-12-03 | Discharge: 2017-12-04 | Disposition: A | Discharge status: Home / Self Care | Payer: Self-pay | Attending: Emergency Medicine | Admitting: Emergency Medicine

## 2017-12-03 ENCOUNTER — Encounter (HOSPITAL_COMMUNITY): Payer: Self-pay

## 2017-12-03 DIAGNOSIS — Z7982 Long term (current) use of aspirin: Secondary | ICD-10-CM | POA: Insufficient documentation

## 2017-12-03 DIAGNOSIS — E1143 Type 2 diabetes mellitus with diabetic autonomic (poly)neuropathy: Secondary | ICD-10-CM

## 2017-12-03 DIAGNOSIS — E7800 Pure hypercholesterolemia, unspecified: Secondary | ICD-10-CM | POA: Insufficient documentation

## 2017-12-03 DIAGNOSIS — E1065 Type 1 diabetes mellitus with hyperglycemia: Secondary | ICD-10-CM | POA: Insufficient documentation

## 2017-12-03 DIAGNOSIS — R739 Hyperglycemia, unspecified: Secondary | ICD-10-CM

## 2017-12-03 DIAGNOSIS — I252 Old myocardial infarction: Secondary | ICD-10-CM | POA: Insufficient documentation

## 2017-12-03 DIAGNOSIS — Z794 Long term (current) use of insulin: Secondary | ICD-10-CM | POA: Insufficient documentation

## 2017-12-03 DIAGNOSIS — Z7902 Long term (current) use of antithrombotics/antiplatelets: Secondary | ICD-10-CM | POA: Insufficient documentation

## 2017-12-03 DIAGNOSIS — E039 Hypothyroidism, unspecified: Secondary | ICD-10-CM | POA: Insufficient documentation

## 2017-12-03 DIAGNOSIS — K3184 Gastroparesis: Secondary | ICD-10-CM | POA: Insufficient documentation

## 2017-12-03 DIAGNOSIS — Z79899 Other long term (current) drug therapy: Secondary | ICD-10-CM | POA: Insufficient documentation

## 2017-12-03 LAB — COMPREHENSIVE METABOLIC PANEL
ALT: 21 U/L (ref 0–44)
ANION GAP: 17 — AB (ref 5–15)
AST: 32 U/L (ref 15–41)
Albumin: 4.2 g/dL (ref 3.5–5.0)
Alkaline Phosphatase: 78 U/L (ref 38–126)
BILIRUBIN TOTAL: 1.5 mg/dL — AB (ref 0.3–1.2)
BUN: 13 mg/dL (ref 6–20)
CHLORIDE: 106 mmol/L (ref 98–111)
CO2: 14 mmol/L — ABNORMAL LOW (ref 22–32)
Calcium: 8.4 mg/dL — ABNORMAL LOW (ref 8.9–10.3)
Creatinine, Ser: 0.84 mg/dL (ref 0.44–1.00)
GFR calc Af Amer: >60 mL/min (ref >60)
GLUCOSE: 324 mg/dL — AB (ref 70–99)
Potassium: 3.8 mmol/L (ref 3.5–5.1)
Sodium: 137 mmol/L (ref 135–145)
TOTAL PROTEIN: 7.5 g/dL (ref 6.5–8.1)

## 2017-12-03 LAB — CBC WITH DIFFERENTIAL/PLATELET
Abs Immature Granulocytes: 0.05 10*3/uL (ref 0.00–0.07)
Basophils Absolute: 0 10*3/uL (ref 0.0–0.1)
Basophils Relative: 0 %
EOS ABS: 0 10*3/uL (ref 0.0–0.5)
EOS PCT: 0 %
HEMATOCRIT: 39.4 % (ref 36.0–46.0)
Hemoglobin: 12.9 g/dL (ref 12.0–15.0)
Immature Granulocytes: 0 %
LYMPHS ABS: 0.5 10*3/uL — AB (ref 0.7–4.0)
Lymphocytes Relative: 4 %
MCH: 30.8 pg (ref 26.0–34.0)
MCHC: 32.7 g/dL (ref 30.0–36.0)
MCV: 94 fL (ref 80.0–100.0)
MONO ABS: 0.2 10*3/uL (ref 0.1–1.0)
MONOS PCT: 2 %
Neutro Abs: 12.3 10*3/uL — ABNORMAL HIGH (ref 1.7–7.7)
Neutrophils Relative %: 94 %
Platelets: 340 10*3/uL (ref 150–400)
RBC: 4.19 MIL/uL (ref 3.87–5.11)
RDW: 12.6 % (ref 11.5–15.5)
WBC: 13.1 10*3/uL — ABNORMAL HIGH (ref 4.0–10.5)
nRBC: 0 % (ref 0.0–0.2)

## 2017-12-03 LAB — I-STAT BETA HCG BLOOD, ED (MC, WL, AP ONLY): I-stat hCG, quantitative: <5 m[IU]/mL (ref <5)

## 2017-12-03 LAB — CBG MONITORING, ED: GLUCOSE-CAPILLARY: 335 mg/dL — AB (ref 70–99)

## 2017-12-03 LAB — LIPASE, BLOOD: Lipase: 17 U/L (ref 11–51)

## 2017-12-03 MED ORDER — LORAZEPAM 2 MG/ML IJ SOLN
1.0000 mg | Freq: Once | INTRAMUSCULAR | Status: AC
  Administered 2017-12-04: 1 mg via INTRAVENOUS
  Filled 2017-12-03: qty 1

## 2017-12-03 MED ORDER — INSULIN REGULAR(HUMAN) IN NACL 100-0.9 UT/100ML-% IV SOLN
INTRAVENOUS | Status: DC
  Administered 2017-12-04: 2.8 [IU]/h via INTRAVENOUS
  Filled 2017-12-03: qty 100

## 2017-12-03 MED ORDER — SODIUM CHLORIDE 0.9 % IV BOLUS
1000.0000 mL | Freq: Once | INTRAVENOUS | Status: AC
  Administered 2017-12-03: 1000 mL via INTRAVENOUS

## 2017-12-03 MED ORDER — INSULIN ASPART 100 UNIT/ML ~~LOC~~ SOLN
10.0000 [IU] | Freq: Once | SUBCUTANEOUS | Status: DC
  Filled 2017-12-03: qty 1

## 2017-12-03 MED ORDER — FENTANYL CITRATE (PF) 100 MCG/2ML IJ SOLN
100.0000 ug | Freq: Once | INTRAMUSCULAR | Status: AC
  Administered 2017-12-03: 100 ug via INTRAVENOUS
  Filled 2017-12-03: qty 2

## 2017-12-03 NOTE — ED Notes (Signed)
Pt advised to turn off personal insulin pump.

## 2017-12-03 NOTE — ED Notes (Signed)
Bed: BM21 Expected date:  Expected time:  Means of arrival:  Comments: 47 yr old N,V,D diabetic

## 2017-12-03 NOTE — ED Triage Notes (Signed)
Pt brought by [facility] due to N/V/D x 1 day. Pt is c/o upper quadrant pain.Pt states her CBG is elevated to 375 at home.   EMS gave pt 4 mg Zofran and some NS.

## 2017-12-03 NOTE — ED Provider Notes (Signed)
[facility] DEPT Provider Note: Georgena Spurling, MD, FACEP  CSN: [id number] MRN: [id number] ARRIVAL: 12/03/17 at 2148 ROOM: [place]  Abdominal Pain   HISTORY OF PRESENT ILLNESS  12/03/17 10:58 PM Jennifer Hogan is a 47 y.o. female with type 1 diabetes.  She complains of severe upper abdominal pain that began earlier today.  It has been associated with nausea vomiting and diarrhea.  She describes as tearing or Ritgen sensation is located across her entire abdomen.  It is worse with movement and palpation.  Despite having an insulin pump she reports her sugar was 375 at home and 335 on arrival.   Past Medical History:  Diagnosis Date  . Acute sinusitis 08/07/2015  . Allergic state 08/07/2015  . Anxiety   . Depression   . Diabetes mellitus   . High cholesterol   . Hypothyroidism   . NSTEMI (non-ST elevated myocardial infarction) ([place]) 03/17/2017   s/p DES x 3 LAD, DES OM    Past Surgical History:  Procedure Laterality Date  . APPENDECTOMY    . CORONARY STENT INTERVENTION N/A 03/20/2017   Procedure: DES x 3 LAD, DES OM; Surgeon: Burnell Blanks, MD;  Location: [facility];  Service: Cardiovascular;  Laterality: N/A;  . LEFT HEART CATH AND CORONARY ANGIOGRAPHY N/A 03/20/2017   Procedure: LEFT HEART CATH AND CORONARY ANGIOGRAPHY;  Surgeon: Burnell Blanks, MD;  Location: [facility];  Service: Cardiovascular;  Laterality: N/A;    Family History  Problem Relation Age of Onset  . Coronary artery disease Father   . Congestive Heart Failure Father   . Asthma Father   . Cancer Mother        T cell lymphoma    Social History   Tobacco Use  . Smoking status: Never Smoker  . Smokeless tobacco: Never Used  . Tobacco comment: Use marijuana  Substance Use Topics  . Alcohol use: No    Alcohol/week: 0.0 standard drinks  . Drug use: Yes    Types: Marijuana    Comment: occ    Prior to Admission medications   Medication Sig  Start Date End Date Taking? Authorizing Provider  albuterol (PROVENTIL HFA;VENTOLIN HFA) 108 (90 Base) MCG/ACT inhaler Inhale 2 puffs into the lungs every 6 (six) hours as needed for wheezing or shortness of breath. 01/17/17  Yes Hoyt Koch, MD  aspirin EC 81 MG EC tablet Take 1 tablet (81 mg total) by mouth daily. 03/21/17  Yes Rai, Ripudeep K, MD  atorvastatin (LIPITOR) 80 MG tablet Take 1 tablet (80 mg total) by mouth at bedtime. 03/21/17  Yes Rai, Ripudeep K, MD  b complex vitamins tablet Take 1 tablet by mouth daily.   Yes [provider]  Carboxymethylcellul-Glycerin (LUBRICATING EYE DROPS OP) Place 1 drop into both eyes daily as needed (dry eyes).   Yes [provider]  clopidogrel (PLAVIX) 75 MG tablet Take 1 tablet (75 mg total) by mouth daily. 10/08/17  Yes Turner, Eber Hong, MD  FLUoxetine (PROZAC) 10 MG capsule Take 1 capsule (10 mg total) by mouth daily. 07/23/17  Yes [name], NP  FLUoxetine (PROZAC) 40 MG capsule Take 1 capsule (40 mg total) by mouth daily. 07/23/17  Yes [name], NP  insulin aspart (NOVOLOG) 100 UNIT/ML injection 35 units per insulin pump daily 10/31/17  Yes Renato Shin, MD  Insulin Infusion Pump Supplies (MINIMED INFUSION SET-MMT 399) MISC See admin instructions. Use with Humulin insulin -  continuous - basal rate 0.625   Yes [provider]  levothyroxine (SYNTHROID) 100 MCG tablet Take 1 tablet (100 mcg total) by mouth daily before breakfast. 10/04/17  Yes [name], NP  metoprolol succinate (TOPROL XL) 25 MG 24 hr tablet Take 0.5 tablets (12.5 mg total) by mouth daily. 10/08/17  Yes Turner, Eber Hong, MD  montelukast (SINGULAIR) 10 MG tablet Take 1 tablet (10 mg total) by mouth at bedtime. 04/30/17  Yes [name], NP  nitroGLYCERIN (NITROSTAT) 0.4 MG SL tablet Place 1 tablet (0.4 mg total) under the tongue every 5 (five) minutes as needed for chest pain. 10/08/17  Yes Turner, Eber Hong, MD  pantoprazole (PROTONIX)  40 MG tablet Take 1 tablet (40 mg total) by mouth daily. 10/08/17  Yes Turner, Traci R, MD  Continuous Glucose Monitor Sup (ENLITE GLUCOSE SENSOR) MISC 1 Device by Does not apply route once a week. 10/08/14   Renato Shin, MD  glucose blood (BAYER CONTOUR NEXT TEST) test strip Test blood glucose 2-3 times a day. Dx code: 250.01 08/23/12   Renato Shin, MD    Allergies Ciprofloxin hcl [ciprofloxacin] and Food color red [red dye]   REVIEW OF SYSTEMS  Negative except as noted here or in the History of Present Illness.   PHYSICAL EXAMINATION  Initial Vital Signs Blood pressure (!) 163/90, pulse (!) 110, resp. rate (!) 31, height 5\' 5"  (1.651 m), weight 65.8 kg, last menstrual period 11/23/2017, SpO2 100 %.  Examination General: Well-developed, well-nourished female in no acute distress; appearance consistent with age of record HENT: normocephalic; atraumatic; dry mucous membranes Eyes: pupils equal, round and reactive to light; extraocular muscles intact Neck: supple Heart: regular rate and rhythm; tachycardia Lungs: clear to auscultation bilaterally; tachypnea Abdomen: soft; nondistended; upper abdominal tenderness; no masses or hepatosplenomegaly; bowel sounds present Extremities: No deformity; full range of motion Neurologic: Awake, alert and oriented; motor function intact in all extremities and symmetric; no facial droop Skin: Warm and dry Psychiatric: Grimacing   RESULTS  Summary of this visit's results, reviewed by myself:   EKG Interpretation  Date/Time:    Ventricular Rate:    PR Interval:    QRS Duration:   QT Interval:    QTC Calculation:   R Axis:     Text Interpretation:        Laboratory Studies: Results for orders placed or performed during the hospital encounter of 12/03/17 (from the past 24 hour(s))  CBG monitoring, ED     Status: Abnormal   Collection Time: 12/03/17 10:06 PM  Result Value Ref Range   Glucose-Capillary 335 (H) 70 - 99 mg/dL   Comment 1  Notify RN   CBC with Differential/Platelet     Status: Abnormal   Collection Time: 12/03/17 10:14 PM  Result Value Ref Range   WBC 13.1 (H) 4.0 - 10.5 K/uL   RBC 4.19 3.87 - 5.11 MIL/uL   Hemoglobin 12.9 12.0 - 15.0 g/dL   HCT 39.4 36.0 - 46.0 %   MCV 94.0 80.0 - 100.0 fL   MCH 30.8 26.0 - 34.0 pg   MCHC 32.7 30.0 - 36.0 g/dL   RDW 12.6 11.5 - 15.5 %   Platelets 340 150 - 400 K/uL   nRBC 0.0 0.0 - 0.2 %   Neutrophils Relative % 94 %   Neutro Abs 12.3 (H) 1.7 - 7.7 K/uL   Lymphocytes Relative 4 %   Lymphs Abs 0.5 (L) 0.7 - 4.0 K/uL   Monocytes Relative  2 %   Monocytes Absolute 0.2 0.1 - 1.0 K/uL   Eosinophils Relative 0 %   Eosinophils Absolute 0.0 0.0 - 0.5 K/uL   Basophils Relative 0 %   Basophils Absolute 0.0 0.0 - 0.1 K/uL   Immature Granulocytes 0 %   Abs Immature Granulocytes 0.05 0.00 - 0.07 K/uL  Comprehensive metabolic panel     Status: Abnormal   Collection Time: 12/03/17 10:14 PM  Result Value Ref Range   Sodium 137 135 - 145 mmol/L   Potassium 3.8 3.5 - 5.1 mmol/L   Chloride 106 98 - 111 mmol/L   CO2 14 (L) 22 - 32 mmol/L   Glucose, Bld 324 (H) 70 - 99 mg/dL   BUN 13 6 - 20 mg/dL   Creatinine, Ser 0.84 0.44 - 1.00 mg/dL   Calcium 8.4 (L) 8.9 - 10.3 mg/dL   Total Protein 7.5 6.5 - 8.1 g/dL   Albumin 4.2 3.5 - 5.0 g/dL   AST 32 15 - 41 U/L   ALT 21 0 - 44 U/L   Alkaline Phosphatase 78 38 - 126 U/L   Total Bilirubin 1.5 (H) 0.3 - 1.2 mg/dL   GFR calc non Af Amer >60 >60 mL/min   GFR calc Af Amer >60 >60 mL/min   Anion gap 17 (H) 5 - 15  Lipase, blood     Status: None   Collection Time: 12/03/17 10:14 PM  Result Value Ref Range   Lipase 17 11 - 51 U/L  Blood gas, venous     Status: Abnormal   Collection Time: 12/03/17 11:05 PM  Result Value Ref Range   pH, Ven 7.398 7.250 - 7.430   pCO2, Ven 24.3 (L) 44.0 - 60.0 mmHg   pO2, Ven 44.2 32.0 - 45.0 mmHg   Bicarbonate 14.7 (L) 20.0 - 28.0 mmol/L   Acid-base deficit 8.2 (H) 0.0 - 2.0 mmol/L   O2 Saturation  74.9 %   Patient temperature 98.6    Collection site VEIN    Drawn by DRAWN BY RN    Sample type VENOUS   I-Stat Beta hCG blood, ED ([facility], [facility], [facility] only)     Status: None   Collection Time: 12/03/17 11:21 PM  Result Value Ref Range   I-stat hCG, quantitative <5.0 <5 mIU/mL   Comment 3          CBG monitoring, ED     Status: None   Collection Time: 12/04/17  1:40 AM  Result Value Ref Range   Glucose-Capillary 96 70 - 99 mg/dL  I-Stat CG4 Lactic Acid, ED     Status: Abnormal   Collection Time: 12/04/17  2:11 AM  Result Value Ref Range   Lactic Acid, Venous 1.99 (H) 0.5 - 1.9 mmol/L  CBG monitoring, ED     Status: Abnormal   Collection Time: 12/04/17  3:58 AM  Result Value Ref Range   Glucose-Capillary 177 (H) 70 - 99 mg/dL   Comment 1 Notify RN   I-Stat CG4 Lactic Acid, ED     Status: None   Collection Time: 12/04/17  4:44 AM  Result Value Ref Range   Lactic Acid, Venous 1.34 0.5 - 1.9 mmol/L  Urinalysis, Routine w reflex microscopic     Status: Abnormal   Collection Time: 12/04/17  5:22 AM  Result Value Ref Range   Color, Urine STRAW (A) YELLOW   APPearance CLEAR CLEAR   Specific Gravity, Urine 1.013 1.005 - 1.030   pH 6.0 5.0 - 8.0  Glucose, UA >=500 (A) NEGATIVE mg/dL   Hgb urine dipstick NEGATIVE NEGATIVE   Bilirubin Urine NEGATIVE NEGATIVE   Ketones, ur 80 (A) NEGATIVE mg/dL   Protein, ur NEGATIVE NEGATIVE mg/dL   Nitrite NEGATIVE NEGATIVE   Leukocytes, UA NEGATIVE NEGATIVE   RBC / HPF 0-5 0 - 5 RBC/hpf   WBC, UA 0-5 0 - 5 WBC/hpf   Bacteria, UA RARE (A) NONE SEEN   Squamous Epithelial / LPF 0-5 0 - 5   Imaging Studies: Ct Abdomen Pelvis W Contrast  Result Date: 12/04/2017 CLINICAL DATA:  Initial evaluation for acute upper abdominal pain, nausea, vomiting, diarrhea. EXAM: CT ABDOMEN AND PELVIS WITH CONTRAST TECHNIQUE: Multidetector CT imaging of the abdomen and pelvis was performed using the standard protocol following bolus administration of intravenous contrast.  CONTRAST:  12mL ISOVUE-300 IOPAMIDOL (ISOVUE-300) INJECTION 61% COMPARISON:  Prior CT from 12/31/2016. FINDINGS: Lower chest: Minimal linear atelectatic changes at the left lung base. Visualized lungs are otherwise clear. Hepatobiliary: Liver demonstrates a normal contrast enhanced appearance. Gallbladder within normal limits. No biliary dilatation. Pancreas: Pancreas within normal limits. Spleen: Spleen within normal limits. Adrenals/Urinary Tract: Adrenal glands are normal. Kidneys equal size with symmetric enhancement. No nephrolithiasis, hydronephrosis, or focal enhancing renal mass. No appreciable hydroureter. Bladder moderately distended without acute abnormality. Stomach/Bowel: Stomach decompressed without acute abnormality. No evidence for bowel obstruction. Appendix is surgically absent. Colon diffusely decompressed. No acute inflammatory changes about the bowels. Vascular/Lymphatic: Normal intravascular enhancement seen throughout the intra-abdominal aorta. Mild aorto bi-iliac atherosclerotic disease. No aneurysm. Mesenteric vessels patent proximally. No adenopathy. Reproductive: Uterus and ovaries within normal limits. Other: No free air or fluid. Musculoskeletal: No acute osseus abnormality. No lytic or blastic osseous lesions. Moderate degenerative spondylolysis at L3-4 and L4-5. Small benign bone island noted within the right sacral ala. IMPRESSION: 1. No CT evidence for acute intra-abdominal or pelvic process. 2. Prior appendectomy. 3. Mild aorto bi-iliac atherosclerotic disease.  No aneurysm. 4. Moderate degenerative spondylolysis at L3-4 and L4-5. Electronically Signed   By: Jeannine Boga M.D.   On: 12/04/2017 05:49    ED COURSE and MDM  Nursing notes and initial vitals signs, including pulse oximetry, reviewed.  Vitals:   12/03/17 2256 12/03/17 2330 12/04/17 0158 12/04/17 0400  BP: (!) 163/90 (!) 170/89 (!) 80/55 (!) 148/89  Pulse: (!) 110 (!) 102 (!) 118 (!) 112  Resp: (!) 31 14  14 10   SpO2: 100% 100% 97% 99%  Weight:      Height:       2:02 AM Patient sugar down to 96, insulin drip discontinued.  Patient now hypotensive despite IV fluid bolus, may be due to second dose of fentanyl.  Patient is now pain-free.  Additional IV fluids ordered.  4:20 AM Pain is returned.  BP now normal.  Sugar 177.  We will obtain the CT scan.  6:51 AM CT scan reassuring.  Patient's pain is improved.  She is drinking fluids without emesis.  She has had similar symptoms in the past but never this prolonged.  This may represent diabetic gastroparesis although she has never been formally diagnosed with this.  We will attempt to trial at home with antiemetics and a small number of pain pills.  Consultation with the [facility] state controlled substances database reveals the patient has received no opioid prescriptions in the past 2 years.   PROCEDURES    ED DIAGNOSES     ICD-10-CM   1. Diabetic gastroparesis ([place]) E11.43    K31.84  2. Hyperglycemia R73.9        Shanon Rosser, MD 12/04/17 [phone]

## 2017-12-04 ENCOUNTER — Encounter (HOSPITAL_COMMUNITY): Payer: Self-pay

## 2017-12-04 ENCOUNTER — Emergency Department (HOSPITAL_COMMUNITY): Payer: Self-pay

## 2017-12-04 LAB — BLOOD GAS, VENOUS
Acid-base deficit: 8.2 mmol/L — ABNORMAL HIGH (ref 0.0–2.0)
Bicarbonate: 14.7 mmol/L — ABNORMAL LOW (ref 20.0–28.0)
O2 Saturation: 74.9 %
Patient temperature: 98.6
pCO2, Ven: 24.3 mmHg — ABNORMAL LOW (ref 44.0–60.0)
pH, Ven: 7.398 (ref 7.250–7.430)
pO2, Ven: 44.2 mmHg (ref 32.0–45.0)

## 2017-12-04 LAB — URINALYSIS, ROUTINE W REFLEX MICROSCOPIC
Bilirubin Urine: NEGATIVE
Glucose, UA: >=500 mg/dL — AB
Hgb urine dipstick: NEGATIVE
Ketones, ur: 80 mg/dL — AB
Leukocytes, UA: NEGATIVE
Nitrite: NEGATIVE
Protein, ur: NEGATIVE mg/dL
Specific Gravity, Urine: 1.013 (ref 1.005–1.030)
pH: 6 (ref 5.0–8.0)

## 2017-12-04 LAB — CBG MONITORING, ED
GLUCOSE-CAPILLARY: 96 mg/dL (ref 70–99)
Glucose-Capillary: 177 mg/dL — ABNORMAL HIGH (ref 70–99)

## 2017-12-04 LAB — I-STAT CG4 LACTIC ACID, ED
Lactic Acid, Venous: 1.99 mmol/L — ABNORMAL HIGH (ref 0.5–1.9)
Lactic Acid, Venous: 1.34 mmol/L (ref 0.5–1.9)

## 2017-12-04 MED ORDER — ONDANSETRON 8 MG PO TBDP: 8.0000 mg | ORAL_TABLET | Freq: Three times a day (TID) | ORAL | 0 refills | Status: DC | PRN

## 2017-12-04 MED ORDER — FENTANYL CITRATE (PF) 100 MCG/2ML IJ SOLN
100.0000 ug | Freq: Once | INTRAMUSCULAR | Status: AC
  Administered 2017-12-04: 100 ug via INTRAVENOUS
  Filled 2017-12-04: qty 2

## 2017-12-04 MED ORDER — SODIUM CHLORIDE 0.9 % IV BOLUS
1000.0000 mL | Freq: Once | INTRAVENOUS | Status: AC
  Administered 2017-12-04: 1000 mL via INTRAVENOUS

## 2017-12-04 MED ORDER — SODIUM CHLORIDE (PF) 0.9 % IJ SOLN
INTRAMUSCULAR | Status: AC
  Filled 2017-12-04: qty 50

## 2017-12-04 MED ORDER — HYDROCODONE-ACETAMINOPHEN 5-325 MG PO TABS: 1.0000 | ORAL_TABLET | ORAL | 0 refills | Status: DC | PRN

## 2017-12-04 MED ORDER — METOCLOPRAMIDE HCL 5 MG/ML IJ SOLN
10.0000 mg | Freq: Once | INTRAMUSCULAR | Status: AC
  Administered 2017-12-04: 10 mg via INTRAVENOUS
  Filled 2017-12-04: qty 2

## 2017-12-04 MED ORDER — PROMETHAZINE HCL 25 MG RE SUPP: 25.0000 mg | Freq: Four times a day (QID) | RECTAL | 0 refills | Status: DC | PRN

## 2017-12-04 MED ORDER — IOPAMIDOL (ISOVUE-300) INJECTION 61%
100.0000 mL | Freq: Once | INTRAVENOUS | Status: AC | PRN
  Administered 2017-12-04: 100 mL via INTRAVENOUS

## 2017-12-04 MED ORDER — FENTANYL CITRATE (PF) 100 MCG/2ML IJ SOLN
50.0000 ug | Freq: Once | INTRAMUSCULAR | Status: AC
  Administered 2017-12-04: 50 ug via INTRAVENOUS
  Filled 2017-12-04: qty 2

## 2017-12-04 MED ORDER — IOPAMIDOL (ISOVUE-300) INJECTION 61%
INTRAVENOUS | Status: AC
  Filled 2017-12-04: qty 100

## 2017-12-06 ENCOUNTER — Other Ambulatory Visit: Payer: Self-pay

## 2017-12-06 ENCOUNTER — Emergency Department (HOSPITAL_COMMUNITY): Payer: Self-pay

## 2017-12-06 ENCOUNTER — Observation Stay (HOSPITAL_COMMUNITY)
Admission: EM | Admit: 2017-12-06 | Discharge: 2017-12-07 | Disposition: A | Discharge status: Home / Self Care | Payer: Self-pay | Attending: Internal Medicine | Admitting: Internal Medicine

## 2017-12-06 ENCOUNTER — Observation Stay (HOSPITAL_COMMUNITY): Payer: Self-pay

## 2017-12-06 ENCOUNTER — Encounter (HOSPITAL_COMMUNITY): Payer: Self-pay | Admitting: Emergency Medicine

## 2017-12-06 DIAGNOSIS — Z881 Allergy status to other antibiotic agents status: Secondary | ICD-10-CM | POA: Insufficient documentation

## 2017-12-06 DIAGNOSIS — E109 Type 1 diabetes mellitus without complications: Secondary | ICD-10-CM | POA: Diagnosis present

## 2017-12-06 DIAGNOSIS — F331 Major depressive disorder, recurrent, moderate: Secondary | ICD-10-CM

## 2017-12-06 DIAGNOSIS — R112 Nausea with vomiting, unspecified: Secondary | ICD-10-CM

## 2017-12-06 DIAGNOSIS — Z9102 Food additives allergy status: Secondary | ICD-10-CM | POA: Insufficient documentation

## 2017-12-06 DIAGNOSIS — I2510 Atherosclerotic heart disease of native coronary artery without angina pectoris: Secondary | ICD-10-CM | POA: Insufficient documentation

## 2017-12-06 DIAGNOSIS — Z79890 Hormone replacement therapy (postmenopausal): Secondary | ICD-10-CM | POA: Insufficient documentation

## 2017-12-06 DIAGNOSIS — J309 Allergic rhinitis, unspecified: Secondary | ICD-10-CM

## 2017-12-06 DIAGNOSIS — K3184 Gastroparesis: Secondary | ICD-10-CM | POA: Insufficient documentation

## 2017-12-06 DIAGNOSIS — M5136 Other intervertebral disc degeneration, lumbar region: Secondary | ICD-10-CM | POA: Insufficient documentation

## 2017-12-06 DIAGNOSIS — Z9641 Presence of insulin pump (external) (internal): Secondary | ICD-10-CM | POA: Insufficient documentation

## 2017-12-06 DIAGNOSIS — I255 Ischemic cardiomyopathy: Secondary | ICD-10-CM | POA: Insufficient documentation

## 2017-12-06 DIAGNOSIS — E1069 Type 1 diabetes mellitus with other specified complication: Secondary | ICD-10-CM

## 2017-12-06 DIAGNOSIS — Z794 Long term (current) use of insulin: Secondary | ICD-10-CM | POA: Insufficient documentation

## 2017-12-06 DIAGNOSIS — F329 Major depressive disorder, single episode, unspecified: Secondary | ICD-10-CM | POA: Insufficient documentation

## 2017-12-06 DIAGNOSIS — Z807 Family history of other malignant neoplasms of lymphoid, hematopoietic and related tissues: Secondary | ICD-10-CM | POA: Insufficient documentation

## 2017-12-06 DIAGNOSIS — F32A Depression, unspecified: Secondary | ICD-10-CM | POA: Diagnosis present

## 2017-12-06 DIAGNOSIS — Z8249 Family history of ischemic heart disease and other diseases of the circulatory system: Secondary | ICD-10-CM | POA: Insufficient documentation

## 2017-12-06 DIAGNOSIS — Z7982 Long term (current) use of aspirin: Secondary | ICD-10-CM | POA: Insufficient documentation

## 2017-12-06 DIAGNOSIS — E1143 Type 2 diabetes mellitus with diabetic autonomic (poly)neuropathy: Secondary | ICD-10-CM | POA: Diagnosis present

## 2017-12-06 DIAGNOSIS — Z7902 Long term (current) use of antithrombotics/antiplatelets: Secondary | ICD-10-CM | POA: Insufficient documentation

## 2017-12-06 DIAGNOSIS — I252 Old myocardial infarction: Secondary | ICD-10-CM | POA: Insufficient documentation

## 2017-12-06 DIAGNOSIS — E1043 Type 1 diabetes mellitus with diabetic autonomic (poly)neuropathy: Principal | ICD-10-CM | POA: Insufficient documentation

## 2017-12-06 DIAGNOSIS — Z955 Presence of coronary angioplasty implant and graft: Secondary | ICD-10-CM | POA: Insufficient documentation

## 2017-12-06 DIAGNOSIS — E7800 Pure hypercholesterolemia, unspecified: Secondary | ICD-10-CM | POA: Insufficient documentation

## 2017-12-06 DIAGNOSIS — R109 Unspecified abdominal pain: Secondary | ICD-10-CM

## 2017-12-06 DIAGNOSIS — Z79899 Other long term (current) drug therapy: Secondary | ICD-10-CM | POA: Insufficient documentation

## 2017-12-06 DIAGNOSIS — E039 Hypothyroidism, unspecified: Secondary | ICD-10-CM | POA: Insufficient documentation

## 2017-12-06 DIAGNOSIS — J4522 Mild intermittent asthma with status asthmaticus: Secondary | ICD-10-CM

## 2017-12-06 DIAGNOSIS — J45909 Unspecified asthma, uncomplicated: Secondary | ICD-10-CM | POA: Insufficient documentation

## 2017-12-06 LAB — COMPREHENSIVE METABOLIC PANEL
ALT: 26 U/L (ref 0–44)
AST: 32 U/L (ref 15–41)
Albumin: 4.4 g/dL (ref 3.5–5.0)
Alkaline Phosphatase: 95 U/L (ref 38–126)
Anion gap: 13 (ref 5–15)
BUN: 17 mg/dL (ref 6–20)
CHLORIDE: 102 mmol/L (ref 98–111)
CO2: 21 mmol/L — ABNORMAL LOW (ref 22–32)
CREATININE: 0.97 mg/dL (ref 0.44–1.00)
Calcium: 9.9 mg/dL (ref 8.9–10.3)
GFR calc non Af Amer: >60 mL/min (ref >60)
Glucose, Bld: 213 mg/dL — ABNORMAL HIGH (ref 70–99)
POTASSIUM: 3.6 mmol/L (ref 3.5–5.1)
SODIUM: 136 mmol/L (ref 135–145)
Total Bilirubin: 1 mg/dL (ref 0.3–1.2)
Total Protein: 8.4 g/dL — ABNORMAL HIGH (ref 6.5–8.1)

## 2017-12-06 LAB — CBC WITH DIFFERENTIAL/PLATELET
ABS IMMATURE GRANULOCYTES: 0.06 10*3/uL (ref 0.00–0.07)
BASOS PCT: 0 %
Basophils Absolute: 0 10*3/uL (ref 0.0–0.1)
Eosinophils Absolute: 0 10*3/uL (ref 0.0–0.5)
Eosinophils Relative: 0 %
HCT: 44.4 % (ref 36.0–46.0)
Hemoglobin: 14.8 g/dL (ref 12.0–15.0)
IMMATURE GRANULOCYTES: 1 %
Lymphocytes Relative: 11 %
Lymphs Abs: 1.2 10*3/uL (ref 0.7–4.0)
MCH: 29.9 pg (ref 26.0–34.0)
MCHC: 33.3 g/dL (ref 30.0–36.0)
MCV: 89.7 fL (ref 80.0–100.0)
MONO ABS: 0.7 10*3/uL (ref 0.1–1.0)
Monocytes Relative: 6 %
NEUTROS ABS: 9.4 10*3/uL — AB (ref 1.7–7.7)
NEUTROS PCT: 82 %
PLATELETS: 404 10*3/uL — AB (ref 150–400)
RBC: 4.95 MIL/uL (ref 3.87–5.11)
RDW: 12.8 % (ref 11.5–15.5)
WBC: 11.4 10*3/uL — AB (ref 4.0–10.5)
nRBC: 0 % (ref 0.0–0.2)

## 2017-12-06 LAB — GLUCOSE, CAPILLARY: Glucose-Capillary: 210 mg/dL — ABNORMAL HIGH (ref 70–99)

## 2017-12-06 LAB — URINALYSIS, ROUTINE W REFLEX MICROSCOPIC
BILIRUBIN URINE: NEGATIVE
Glucose, UA: >=500 mg/dL — AB
Ketones, ur: 80 mg/dL — AB
Leukocytes, UA: NEGATIVE
NITRITE: NEGATIVE
PH: 6 (ref 5.0–8.0)
Protein, ur: 100 mg/dL — AB
SPECIFIC GRAVITY, URINE: 1.017 (ref 1.005–1.030)

## 2017-12-06 LAB — I-STAT TROPONIN, ED: Troponin i, poc: 0 ng/mL (ref 0.00–0.08)

## 2017-12-06 LAB — I-STAT VENOUS BLOOD GAS, ED
ACID-BASE DEFICIT: 3 mmol/L — AB (ref 0.0–2.0)
BICARBONATE: 22.1 mmol/L (ref 20.0–28.0)
O2 Saturation: 52 %
PH VEN: 7.36 (ref 7.250–7.430)
TCO2: 23 mmol/L (ref 22–32)
pCO2, Ven: 39.2 mmHg — ABNORMAL LOW (ref 44.0–60.0)
pO2, Ven: 29 mmHg — CL (ref 32.0–45.0)

## 2017-12-06 LAB — RAPID URINE DRUG SCREEN, HOSP PERFORMED
AMPHETAMINES: NOT DETECTED
BARBITURATES: NOT DETECTED
Benzodiazepines: NOT DETECTED
COCAINE: NOT DETECTED
OPIATES: POSITIVE — AB
TETRAHYDROCANNABINOL: POSITIVE — AB

## 2017-12-06 LAB — PROTIME-INR
INR: 0.97
PROTHROMBIN TIME: 12.8 s (ref 11.4–15.2)

## 2017-12-06 LAB — LIPASE, BLOOD: Lipase: 24 U/L (ref 11–51)

## 2017-12-06 LAB — I-STAT CG4 LACTIC ACID, ED: LACTIC ACID, VENOUS: 1.66 mmol/L (ref 0.5–1.9)

## 2017-12-06 MED ORDER — ACETAMINOPHEN 325 MG PO TABS: 650.0000 mg | ORAL_TABLET | Freq: Four times a day (QID) | ORAL | Status: DC | PRN

## 2017-12-06 MED ORDER — RINGERS IV SOLN: INTRAVENOUS | Status: DC

## 2017-12-06 MED ORDER — SODIUM CHLORIDE 0.9 % IV SOLN
1000.0000 mL | INTRAVENOUS | Status: DC
  Administered 2017-12-06 (×2): 1000 mL via INTRAVENOUS

## 2017-12-06 MED ORDER — METOCLOPRAMIDE HCL 5 MG/ML IJ SOLN
5.0000 mg | Freq: Four times a day (QID) | INTRAMUSCULAR | Status: DC
  Administered 2017-12-07 (×3): 5 mg via INTRAVENOUS
  Filled 2017-12-06 (×3): qty 2

## 2017-12-06 MED ORDER — METOPROLOL SUCCINATE 12.5 MG HALF TABLET
12.5000 mg | ORAL_TABLET | Freq: Every day | ORAL | Status: DC
  Administered 2017-12-06: 12.5 mg via ORAL
  Filled 2017-12-06 (×2): qty 1

## 2017-12-06 MED ORDER — ASPIRIN EC 81 MG PO TBEC
81.0000 mg | DELAYED_RELEASE_TABLET | Freq: Every day | ORAL | Status: DC
  Administered 2017-12-07: 81 mg via ORAL
  Filled 2017-12-06: qty 1

## 2017-12-06 MED ORDER — INSULIN PUMP
Freq: Three times a day (TID) | SUBCUTANEOUS | Status: DC
  Administered 2017-12-07: 08:00:00 via SUBCUTANEOUS
  Filled 2017-12-06: qty 1

## 2017-12-06 MED ORDER — ALBUTEROL SULFATE (2.5 MG/3ML) 0.083% IN NEBU: 2.5000 mg | INHALATION_SOLUTION | Freq: Four times a day (QID) | RESPIRATORY_TRACT | Status: DC | PRN

## 2017-12-06 MED ORDER — NITROGLYCERIN 0.4 MG SL SUBL: 0.4000 mg | SUBLINGUAL_TABLET | SUBLINGUAL | Status: DC | PRN

## 2017-12-06 MED ORDER — PANTOPRAZOLE SODIUM 40 MG IV SOLR
40.0000 mg | Freq: Once | INTRAVENOUS | Status: AC
  Administered 2017-12-06: 40 mg via INTRAVENOUS
  Filled 2017-12-06: qty 40

## 2017-12-06 MED ORDER — PROMETHAZINE HCL 25 MG/ML IJ SOLN
25.0000 mg | Freq: Once | INTRAMUSCULAR | Status: AC
  Administered 2017-12-06: 25 mg via INTRAVENOUS
  Filled 2017-12-06: qty 1

## 2017-12-06 MED ORDER — ONDANSETRON 4 MG PO TBDP: 8.0000 mg | ORAL_TABLET | Freq: Three times a day (TID) | ORAL | Status: DC | PRN

## 2017-12-06 MED ORDER — LEVOTHYROXINE SODIUM 100 MCG PO TABS
100.0000 ug | ORAL_TABLET | Freq: Every day | ORAL | Status: DC
  Administered 2017-12-07: 100 ug via ORAL
  Filled 2017-12-06: qty 1

## 2017-12-06 MED ORDER — MORPHINE SULFATE (PF) 4 MG/ML IV SOLN
4.0000 mg | Freq: Once | INTRAVENOUS | Status: AC
  Administered 2017-12-06: 4 mg via INTRAVENOUS
  Filled 2017-12-06: qty 1

## 2017-12-06 MED ORDER — ACETAMINOPHEN 650 MG RE SUPP: 650.0000 mg | Freq: Four times a day (QID) | RECTAL | Status: DC | PRN

## 2017-12-06 MED ORDER — ATORVASTATIN CALCIUM 20 MG PO TABS
80.0000 mg | ORAL_TABLET | Freq: Every day | ORAL | Status: DC
  Administered 2017-12-06: 40 mg via ORAL
  Filled 2017-12-06: qty 4

## 2017-12-06 MED ORDER — SODIUM CHLORIDE 0.9 % IV BOLUS (SEPSIS)
1000.0000 mL | Freq: Once | INTRAVENOUS | Status: AC
  Administered 2017-12-06: 1000 mL via INTRAVENOUS

## 2017-12-06 MED ORDER — MONTELUKAST SODIUM 10 MG PO TABS
10.0000 mg | ORAL_TABLET | Freq: Every day | ORAL | Status: DC
  Administered 2017-12-06: 10 mg via ORAL
  Filled 2017-12-06: qty 1

## 2017-12-06 MED ORDER — PROMETHAZINE HCL 25 MG PO TABS: 12.5000 mg | ORAL_TABLET | Freq: Four times a day (QID) | ORAL | Status: DC | PRN

## 2017-12-06 MED ORDER — HYDROCODONE-ACETAMINOPHEN 5-325 MG PO TABS: 1.0000 | ORAL_TABLET | ORAL | Status: DC | PRN

## 2017-12-06 MED ORDER — CLOPIDOGREL BISULFATE 75 MG PO TABS
75.0000 mg | ORAL_TABLET | Freq: Every day | ORAL | Status: DC
  Administered 2017-12-07: 75 mg via ORAL
  Filled 2017-12-06 (×2): qty 1

## 2017-12-06 MED ORDER — ONDANSETRON HCL 4 MG/2ML IJ SOLN
4.0000 mg | Freq: Once | INTRAMUSCULAR | Status: AC
  Administered 2017-12-06: 4 mg via INTRAVENOUS
  Filled 2017-12-06: qty 2

## 2017-12-06 MED ORDER — LACTATED RINGERS IV SOLN
INTRAVENOUS | Status: DC
  Administered 2017-12-06 – 2017-12-07 (×2): via INTRAVENOUS

## 2017-12-06 MED ORDER — PROMETHAZINE HCL 25 MG RE SUPP
25.0000 mg | Freq: Four times a day (QID) | RECTAL | Status: DC | PRN
  Filled 2017-12-06: qty 1

## 2017-12-06 NOTE — ED Notes (Signed)
Patient transported to Ultrasound 

## 2017-12-06 NOTE — ED Notes (Signed)
ED TO INPATIENT HANDOFF REPORT  Name/Age/Gender Jennifer Hogan 47 y.o. female  Code Status    Code Status Orders  (From admission, onward)         Start     Ordered   12/06/17 1715  Full code  Continuous     12/06/17 1717        Code Status History    Date Active Date Inactive Code Status Order ID Comments User Context   03/17/2017 2023 03/22/2017 1346 Full Code [id number]  Jani Gravel, MD ED   03/16/2017 0001 03/17/2017 0045 Full Code [id number]  Rise Patience, MD Inpatient      Home/SNF/Other Home  Chief Complaint n/v  Level of Care/Admitting Diagnosis ED Disposition    ED Disposition Condition [facility] Area: [place] [100100]  Level of Care: Med-Surg [16]  I expect the patient will be discharged within 24 hours: Yes  LOW acuity---Tx typically complete <24 hrs---ACUTE conditions typically can be evaluated <24 hours---LABS likely to return to acceptable levels <24 hours---IS near functional baseline---EXPECTED to return to current living arrangement---NOT newly hypoxic: Does not meet criteria for 5C-Observation unit  Diagnosis: Gastroparesis [536.3.ICD-9-CM]  Admitting Physician: Guilford Shi [[id number]]  Attending Physician: Guilford Shi [[id number]]  PT Class (Do Not Modify): Observation [104]  PT Acc Code (Do Not Modify): Observation [10022]       Medical History Past Medical History:  Diagnosis Date  . Acute sinusitis 08/07/2015  . Allergic state 08/07/2015  . Anxiety   . Depression   . Diabetes mellitus   . High cholesterol   . Hypothyroidism   . NSTEMI (non-ST elevated myocardial infarction) ([place]) 03/17/2017   s/p DES x 3 LAD, DES OM    Allergies Allergies  Allergen Reactions  . Ciprofloxin Hcl [Ciprofloxacin] Nausea And Vomiting and Other (See Comments)    Severe migraine  . Food Color Red [Red Dye] Diarrhea    IV Location/Drains/Wounds Patient Lines/Drains/Airways Status   Active  Line/Drains/Airways    Name:   Placement date:   Placement time:   Site:   Days:   Peripheral IV 12/06/17 Right Antecubital   12/06/17    1058    Antecubital   less than 1          Labs/Imaging Results for orders placed or performed during the hospital encounter of 12/06/17 (from the past 48 hour(s))  Urine rapid drug screen (hosp performed)     Status: Abnormal   Collection Time: 12/06/17 10:35 AM  Result Value Ref Range   Opiates POSITIVE (A) NONE DETECTED   Cocaine NONE DETECTED NONE DETECTED   Benzodiazepines NONE DETECTED NONE DETECTED   Amphetamines NONE DETECTED NONE DETECTED   Tetrahydrocannabinol POSITIVE (A) NONE DETECTED   Barbiturates NONE DETECTED NONE DETECTED    Comment: (NOTE) DRUG SCREEN FOR MEDICAL PURPOSES ONLY.  IF CONFIRMATION IS NEEDED FOR ANY PURPOSE, NOTIFY LAB WITHIN 5 DAYS. LOWEST DETECTABLE LIMITS FOR URINE DRUG SCREEN Drug Class                     Cutoff (ng/mL) Amphetamine and metabolites    1000 Barbiturate and metabolites    200 Benzodiazepine                 415 Tricyclics and metabolites     300 Opiates and metabolites        300 Cocaine and metabolites        300 THC  50 Performed at [facility], [place] [address], [place], [place] [address]   Comprehensive metabolic panel     Status: Abnormal   Collection Time: 12/06/17 10:56 AM  Result Value Ref Range   Sodium 136 135 - 145 mmol/L   Potassium 3.6 3.5 - 5.1 mmol/L   Chloride 102 98 - 111 mmol/L   CO2 21 (L) 22 - 32 mmol/L   Glucose, Bld 213 (H) 70 - 99 mg/dL   BUN 17 6 - 20 mg/dL   Creatinine, Ser 0.97 0.44 - 1.00 mg/dL   Calcium 9.9 8.9 - 10.3 mg/dL   Total Protein 8.4 (H) 6.5 - 8.1 g/dL   Albumin 4.4 3.5 - 5.0 g/dL   AST 32 15 - 41 U/L   ALT 26 0 - 44 U/L   Alkaline Phosphatase 95 38 - 126 U/L   Total Bilirubin 1.0 0.3 - 1.2 mg/dL   GFR calc non Af Amer >60 >60 mL/min   GFR calc Af Amer >60 >60 mL/min    Comment: (NOTE) The eGFR has been  calculated using the CKD EPI equation. This calculation has not been validated in all clinical situations. eGFR's persistently <60 mL/min signify possible Chronic Kidney Disease.    Anion gap 13 5 - 15    Comment: Performed at [facility] [address], [place], [place] [address]  Lipase, blood     Status: None   Collection Time: 12/06/17 10:56 AM  Result Value Ref Range   Lipase 24 11 - 51 U/L    Comment: Performed at [facility] [address], [place], [place] [address]  CBC with Differential     Status: Abnormal   Collection Time: 12/06/17 10:56 AM  Result Value Ref Range   WBC 11.4 (H) 4.0 - 10.5 K/uL   RBC 4.95 3.87 - 5.11 MIL/uL   Hemoglobin 14.8 12.0 - 15.0 g/dL   HCT 44.4 36.0 - 46.0 %   MCV 89.7 80.0 - 100.0 fL   MCH 29.9 26.0 - 34.0 pg   MCHC 33.3 30.0 - 36.0 g/dL   RDW 12.8 11.5 - 15.5 %   Platelets 404 (H) 150 - 400 K/uL   nRBC 0.0 0.0 - 0.2 %   Neutrophils Relative % 82 %   Neutro Abs 9.4 (H) 1.7 - 7.7 K/uL   Lymphocytes Relative 11 %   Lymphs Abs 1.2 0.7 - 4.0 K/uL   Monocytes Relative 6 %   Monocytes Absolute 0.7 0.1 - 1.0 K/uL   Eosinophils Relative 0 %   Eosinophils Absolute 0.0 0.0 - 0.5 K/uL   Basophils Relative 0 %   Basophils Absolute 0.0 0.0 - 0.1 K/uL   Immature Granulocytes 1 %   Abs Immature Granulocytes 0.06 0.00 - 0.07 K/uL    Comment: Performed at [facility] [address], [place], [place] [address]  Protime-INR     Status: None   Collection Time: 12/06/17 10:56 AM  Result Value Ref Range   Prothrombin Time 12.8 11.4 - 15.2 seconds   INR 0.97     Comment: Performed at [facility] [address], [place],  [address]  I-stat troponin, ED     Status: None   Collection Time: 12/06/17 11:15 AM  Result Value Ref Range   Troponin i, poc 0.00 0.00 - 0.08 ng/mL   Comment 3            Comment: Due to the release  kinetics of cTnI, a negative result within the first hours of the onset of symptoms does not  rule out myocardial infarction with certainty. If myocardial infarction is still suspected, repeat the test at appropriate intervals.   I-Stat venous blood gas, ED     Status: Abnormal   Collection Time: 12/06/17 11:15 AM  Result Value Ref Range   pH, Ven 7.360 7.250 - 7.430   pCO2, Ven 39.2 (L) 44.0 - 60.0 mmHg   pO2, Ven 29.0 (LL) 32.0 - 45.0 mmHg   Bicarbonate 22.1 20.0 - 28.0 mmol/L   TCO2 23 22 - 32 mmol/L   O2 Saturation 52.0 %   Acid-base deficit 3.0 (H) 0.0 - 2.0 mmol/L   Patient temperature HIDE    Sample type VENOUS    Comment NOTIFIED PHYSICIAN   I-Stat CG4 Lactic Acid, ED     Status: None   Collection Time: 12/06/17 11:29 AM  Result Value Ref Range   Lactic Acid, Venous 1.66 0.5 - 1.9 mmol/L  Urinalysis, Routine w reflex microscopic     Status: Abnormal   Collection Time: 12/06/17 12:39 PM  Result Value Ref Range   Color, Urine YELLOW YELLOW   APPearance HAZY (A) CLEAR   Specific Gravity, Urine 1.017 1.005 - 1.030   pH 6.0 5.0 - 8.0   Glucose, UA >=500 (A) NEGATIVE mg/dL   Hgb urine dipstick SMALL (A) NEGATIVE   Bilirubin Urine NEGATIVE NEGATIVE   Ketones, ur 80 (A) NEGATIVE mg/dL   Protein, ur 100 (A) NEGATIVE mg/dL   Nitrite NEGATIVE NEGATIVE   Leukocytes, UA NEGATIVE NEGATIVE   RBC / HPF 0-5 0 - 5 RBC/hpf   WBC, UA 6-10 0 - 5 WBC/hpf   Bacteria, UA FEW (A) NONE SEEN   Squamous Epithelial / LPF 0-5 0 - 5   Mucus PRESENT    Hyaline Casts, UA PRESENT    Granular Casts, UA PRESENT     Comment: Performed at [facility], [address], [place], [place] [address]   US Abdomen Limited  Result Date: 12/06/2017 CLINICAL DATA:  Right upper quadrant pain for 5 days EXAM: ULTRASOUND ABDOMEN LIMITED RIGHT UPPER QUADRANT COMPARISON:  CT abdomen pelvis of 12/04/2017 FINDINGS: Gallbladder: The gallbladder is visualized and no gallstones are noted. There is no pain over the gallbladder with compression. Common bile duct: Diameter: The common bile duct is normal  measuring 4.4 mm in diameter. Liver: The parenchyma of the liver is normal in echogenicity. No focal hepatic abnormality is seen. Portal vein is patent on color Doppler imaging with normal direction of blood flow towards the liver. IMPRESSION: Negative limited ultrasound of the right upper quadrant. Electronically Signed   By: Ivar Drape M.D.   On: 12/06/2017 12:14    Pending Labs Unresulted Labs (From admission, onward)    Start     Ordered   12/07/17 3664  Basic metabolic panel  Tomorrow morning,   R     12/06/17 1717   12/07/17 0500  CBC  Tomorrow morning,   R     12/06/17 1717   12/06/17 1036  Blood gas, venous  Once,   STAT     12/06/17 1035          Vitals/Pain Today's Vitals   12/06/17 1415 12/06/17 1430 12/06/17 1445 12/06/17 1630  BP: (!) 163/83 (!) 159/92 (!) 179/92 (!) 167/89  Pulse: 92 92 90 93  Resp: (!) 23 (!) 24 20 (!) 22  Temp:  TempSrc:      SpO2: 100% 100% 100% 98%  PainSc:        Isolation Precautions No active isolations  Medications Medications  sodium chloride 0.9 % bolus 1,000 mL (0 mLs Intravenous Stopped 12/06/17 1229)    Followed by  0.9 %  sodium chloride infusion (1,000 mLs Intravenous New Bag/Given 12/06/17 1123)  insulin pump (has no administration in time range)  ondansetron (ZOFRAN-ODT) disintegrating tablet 8 mg (has no administration in time range)  promethazine (PHENERGAN) suppository 25 mg (has no administration in time range)  aspirin EC tablet 81 mg (has no administration in time range)  HYDROcodone-acetaminophen (NORCO/VICODIN) 5-325 MG per tablet 1 tablet (has no administration in time range)  atorvastatin (LIPITOR) tablet 80 mg (has no administration in time range)  metoprolol succinate (TOPROL-XL) 24 hr tablet 12.5 mg (has no administration in time range)  nitroGLYCERIN (NITROSTAT) SL tablet 0.4 mg (has no administration in time range)  levothyroxine (SYNTHROID, LEVOTHROID) tablet 100 mcg (has no administration in time range)   clopidogrel (PLAVIX) tablet 75 mg (has no administration in time range)  albuterol (PROVENTIL) (2.5 MG/3ML) 0.083% nebulizer solution 2.5 mg (has no administration in time range)  montelukast (SINGULAIR) tablet 10 mg (has no administration in time range)  promethazine (PHENERGAN) tablet 12.5 mg (has no administration in time range)  metoCLOPramide (REGLAN) injection 5 mg (has no administration in time range)  acetaminophen (TYLENOL) tablet 650 mg (has no administration in time range)    Or  acetaminophen (TYLENOL) suppository 650 mg (has no administration in time range)  ringers solution (has no administration in time range)  ondansetron (ZOFRAN) injection 4 mg (4 mg Intravenous Given 12/06/17 1100)  pantoprazole (PROTONIX) injection 40 mg (40 mg Intravenous Given 12/06/17 1124)  morphine 4 MG/ML injection 4 mg (4 mg Intravenous Given 12/06/17 1100)  promethazine (PHENERGAN) injection 25 mg (25 mg Intravenous Given 12/06/17 1312)    Mobility walks

## 2017-12-06 NOTE — ED Triage Notes (Signed)
PT reports abdominal pain and vomiting for 4-5 days. PT was seen at [facility] 3 days ago. PT was having diarrhea a few days ago. PT reports she vomits every time she tries to take in PO content.

## 2017-12-06 NOTE — ED Provider Notes (Signed)
[facility] EMERGENCY DEPARTMENT Provider Note   CSN: [id number] Arrival date & time: 12/06/17  1017     History   Chief Complaint Chief Complaint  Patient presents with  . Abdominal Pain  . Emesis    HPI Jennifer Hogan is a 47 y.o. female.  HPI Patient reports that she has been sick for at least for 5 days.  She reports that she has been vomiting recurrently.  She has not been able to eat or drink anything.  She reports that she initially had pain that was more upper abdominal pain.  She reports her pain is very generalized now.  It is severe and aching.  She reports she was having diarrhea a few days ago but that is stopped but she continues to have dry heaves and vomiting.  She is not sure if she has had any fever.  She reports that she has not had any sick contacts that she knows of.  Patient is diabetic with an insulin pump.  She reports her blood sugars have been running up to the 300s.  She reports she has continued to get worse since she was seen 3 days ago.  She denies abnormal vaginal bleeding or discharge.  She reports that she is making urine.  No specific pain or burning. Past Medical History:  Diagnosis Date  . Acute sinusitis 08/07/2015  . Allergic state 08/07/2015  . Anxiety   . Depression   . Diabetes mellitus   . High cholesterol   . Hypothyroidism   . NSTEMI (non-ST elevated myocardial infarction) ([place]) 03/17/2017   s/p DES x 3 LAD, DES OM    Patient Active Problem List   Diagnosis Date Noted  . Gastroparesis due to DM ([place]) 03/28/2017  . Coronary artery disease 03/28/2017  . Ischemic cardiomyopathy 03/28/2017  . Pure hypercholesterolemia   . Anemia 03/17/2017  . Allergic state 08/07/2015  . Vitiligo 07/09/2012  . Type 1 diabetes mellitus ([place]) 07/09/2012  . Depression 12/04/2010  . Hypothyroidism 12/04/2010    Past Surgical History:  Procedure Laterality Date  . APPENDECTOMY    . CORONARY STENT INTERVENTION N/A 03/20/2017   Procedure: DES x 3 LAD, DES OM; Surgeon: Burnell Blanks, MD;  Location: [facility];  Service: Cardiovascular;  Laterality: N/A;  . LEFT HEART CATH AND CORONARY ANGIOGRAPHY N/A 03/20/2017   Procedure: LEFT HEART CATH AND CORONARY ANGIOGRAPHY;  Surgeon: Burnell Blanks, MD;  Location: [facility];  Service: Cardiovascular;  Laterality: N/A;     OB History    Gravida  0   Para  0   Term  0   Preterm  0   AB  0   Living  0     SAB  0   TAB  0   Ectopic  0   Multiple  0   Live Births  0            Home Medications    Prior to Admission medications   Medication Sig Start Date End Date Taking? Authorizing Provider  albuterol (PROVENTIL HFA;VENTOLIN HFA) 108 (90 Base) MCG/ACT inhaler Inhale 2 puffs into the lungs every 6 (six) hours as needed for wheezing or shortness of breath. 01/17/17  Yes Hoyt Koch, MD  aspirin EC 81 MG EC tablet Take 1 tablet (81 mg total) by mouth daily. 03/21/17  Yes Rai, Ripudeep K, MD  atorvastatin (LIPITOR) 80 MG tablet Take 1 tablet (80 mg total) by mouth  at bedtime. 03/21/17  Yes Rai, Ripudeep K, MD  b complex vitamins tablet Take 1 tablet by mouth daily.   Yes [provider]  Carboxymethylcellul-Glycerin (LUBRICATING EYE DROPS OP) Place 1 drop into both eyes daily as needed (dry eyes).   Yes [provider]  clopidogrel (PLAVIX) 75 MG tablet Take 1 tablet (75 mg total) by mouth daily. 10/08/17  Yes Turner, Eber Hong, MD  FLUoxetine (PROZAC) 10 MG capsule Take 1 capsule (10 mg total) by mouth daily. 07/23/17  Yes [name], NP  FLUoxetine (PROZAC) 40 MG capsule Take 1 capsule (40 mg total) by mouth daily. 07/23/17  Yes [name], NP  HYDROcodone-acetaminophen (NORCO) 5-325 MG tablet Take 1 tablet by mouth every 4 (four) hours as needed (for pain). 12/04/17  Yes Molpus, John, MD  insulin aspart (NOVOLOG) 100 UNIT/ML injection 35 units per insulin pump daily 10/31/17  Yes Renato Shin, MD  Insulin Infusion Pump Supplies (MINIMED INFUSION SET-MMT 399) MISC See admin instructions. Use with Humulin insulin - continuous - basal rate 0.625   Yes [provider]  levothyroxine (SYNTHROID) 100 MCG tablet Take 1 tablet (100 mcg total) by mouth daily before breakfast. 10/04/17  Yes [name], NP  metoprolol succinate (TOPROL XL) 25 MG 24 hr tablet Take 0.5 tablets (12.5 mg total) by mouth daily. 10/08/17  Yes Turner, Eber Hong, MD  montelukast (SINGULAIR) 10 MG tablet Take 1 tablet (10 mg total) by mouth at bedtime. 04/30/17  Yes [name], NP  nitroGLYCERIN (NITROSTAT) 0.4 MG SL tablet Place 1 tablet (0.4 mg total) under the tongue every 5 (five) minutes as needed for chest pain. 10/08/17  Yes Turner, Eber Hong, MD  pantoprazole (PROTONIX) 40 MG tablet Take 1 tablet (40 mg total) by mouth daily. 10/08/17  Yes Turner, Traci R, MD  Continuous Glucose Monitor Sup (ENLITE GLUCOSE SENSOR) MISC 1 Device by Does not apply route once a week. 10/08/14   Renato Shin, MD  glucose blood (BAYER CONTOUR NEXT TEST) test strip Test blood glucose 2-3 times a day. Dx code: 250.01 08/23/12   Renato Shin, MD  ondansetron (ZOFRAN ODT) 8 MG disintegrating tablet Take 1 tablet (8 mg total) by mouth every 8 (eight) hours as needed for nausea or vomiting. Patient not taking: Reported on 12/06/2017 12/04/17   Molpus, Jenny Reichmann, MD  promethazine (PHENERGAN) 25 MG suppository Place 1 suppository (25 mg total) rectally every 6 (six) hours as needed for nausea or vomiting. Patient not taking: Reported on 12/06/2017 12/04/17   Molpus, Jenny Reichmann, MD    Family History Family History  Problem Relation Age of Onset  . Coronary artery disease Father   . Congestive Heart Failure Father   . Asthma Father   . Cancer Mother        T cell lymphoma    Social History Social History   Tobacco Use  . Smoking status: Never Smoker  . Smokeless tobacco: Never Used  . Tobacco comment: Use marijuana  Substance  Use Topics  . Alcohol use: No    Alcohol/week: 0.0 standard drinks  . Drug use: Yes    Types: Marijuana    Comment: occ     Allergies   Ciprofloxin hcl [ciprofloxacin] and Food color red [red dye]   Review of Systems Review of Systems 10 Systems reviewed and are negative for acute change except as noted in the HPI.   Physical Exam Updated Vital Signs BP (!) 179/92   Pulse 90  Temp 97.8 F (36.6 C) (Oral) Comment: Simultaneous filing. User may not have seen previous data. Comment (Src): Simultaneous filing. User may not have seen previous data.  Resp 20   LMP 11/23/2017 Comment: negative beta HCG 12/03/17  SpO2 100%   Physical Exam  Constitutional: She is oriented to person, place, and time.  Patient appears ill and very uncomfortable.  At first she is crouched on the cart.  She then transition to lie supine on the cart.  She is alert and appropriate.  She does not have respiratory distress.  Nontoxic.  HENT:  Head: Normocephalic and atraumatic.  Mucous membranes dry.  Posterior oropharynx widely patent.  No facial swelling.  Eyes: Pupils are equal, round, and reactive to light. EOM are normal.  Neck: Neck supple.  Cardiovascular: Normal rate, regular rhythm, normal heart sounds and intact distal pulses.  Pulmonary/Chest: Effort normal and breath sounds normal.  Abdominal:  Abdomen is soft but diffusely tender.  Generally more tender upper abdomen.  No guarding.  Musculoskeletal: Normal range of motion. She exhibits no edema, tenderness or deformity.  Neurological: She is alert and oriented to person, place, and time. No cranial nerve deficit. She exhibits normal muscle tone. Coordination normal.  Skin: Skin is warm and dry.  Psychiatric:  Patient appears very uncomfortable.     ED Treatments / Results  Labs (all labs ordered are listed, but only abnormal results are displayed) Labs Reviewed  COMPREHENSIVE METABOLIC PANEL - Abnormal; Notable for the following  components:      Result Value   CO2 21 (*)    Glucose, Bld 213 (*)    Total Protein 8.4 (*)    All other components within normal limits  CBC WITH DIFFERENTIAL/PLATELET - Abnormal; Notable for the following components:   WBC 11.4 (*)    Platelets 404 (*)    Neutro Abs 9.4 (*)    All other components within normal limits  URINALYSIS, ROUTINE W REFLEX MICROSCOPIC - Abnormal; Notable for the following components:   APPearance HAZY (*)    Glucose, UA >=500 (*)    Hgb urine dipstick SMALL (*)    Ketones, ur 80 (*)    Protein, ur 100 (*)    Bacteria, UA FEW (*)    All other components within normal limits  RAPID URINE DRUG SCREEN, HOSP PERFORMED - Abnormal; Notable for the following components:   Opiates POSITIVE (*)    Tetrahydrocannabinol POSITIVE (*)    All other components within normal limits  I-STAT VENOUS BLOOD GAS, ED - Abnormal; Notable for the following components:   pCO2, Ven 39.2 (*)    pO2, Ven 29.0 (*)    Acid-base deficit 3.0 (*)    All other components within normal limits  LIPASE, BLOOD  PROTIME-INR  BLOOD GAS, VENOUS  I-STAT CG4 LACTIC ACID, ED  I-STAT TROPONIN, ED  I-STAT CG4 LACTIC ACID, ED    EKG EKG Interpretation  Date/Time:  Thursday December 06 2017 10:47:40 EST Ventricular Rate:  85 PR Interval:    QRS Duration: 85 QT Interval:  378 QTC Calculation: 450 R Axis:   74 Text Interpretation:  Sinus rhythm Biatrial enlargement Baseline wander in lead(s) V6 normalization of previously inverted T waves. this EKG similar to older EKG 03/15/2017. Confirmed by Charlesetta Shanks [phone]) on 12/06/2017 10:51:36 AM Also confirmed by Charlesetta Shanks [phone]), editor Philomena Doheny [phone])  on 12/06/2017 12:25:58 PM   Radiology US Abdomen Limited  Result Date: 12/06/2017 CLINICAL DATA:  Right upper quadrant pain  for 5 days EXAM: ULTRASOUND ABDOMEN LIMITED RIGHT UPPER QUADRANT COMPARISON:  CT abdomen pelvis of 12/04/2017 FINDINGS: Gallbladder: The gallbladder is  visualized and no gallstones are noted. There is no pain over the gallbladder with compression. Common bile duct: Diameter: The common bile duct is normal measuring 4.4 mm in diameter. Liver: The parenchyma of the liver is normal in echogenicity. No focal hepatic abnormality is seen. Portal vein is patent on color Doppler imaging with normal direction of blood flow towards the liver. IMPRESSION: Negative limited ultrasound of the right upper quadrant. Electronically Signed   By: Ivar Drape M.D.   On: 12/06/2017 12:14    Procedures Procedures (including critical care time)  Medications Ordered in ED Medications  sodium chloride 0.9 % bolus 1,000 mL (0 mLs Intravenous Stopped 12/06/17 1229)    Followed by  0.9 %  sodium chloride infusion (1,000 mLs Intravenous New Bag/Given 12/06/17 1123)  ondansetron (ZOFRAN) injection 4 mg (4 mg Intravenous Given 12/06/17 1100)  pantoprazole (PROTONIX) injection 40 mg (40 mg Intravenous Given 12/06/17 1124)  morphine 4 MG/ML injection 4 mg (4 mg Intravenous Given 12/06/17 1100)  promethazine (PHENERGAN) injection 25 mg (25 mg Intravenous Given 12/06/17 1312)     Initial Impression / Assessment and Plan / ED Course  I have reviewed the triage vital signs and the nursing notes.  Pertinent labs & imaging results that were available during my care of the patient were reviewed by me and considered in my medical decision making (see chart for details).  Clinical Course as of Dec 06 1625  Thu Dec 06, 2017  1336 Order placed for consult to unassigned medicine for admission.   [MP]  1416 Repeat order placed for consult to medical unassigned for admission.   [MP]  1430 Consult: Medical service has returned call for admission.   [MP]    Clinical Course User Index [MP] Charlesetta Shanks, MD   Patient has had almost 7 days of symptoms.  She has had recurrent nausea and vomiting.  Patient has diffuse nonlocalizing abdominal pain.  She did have CT scan within the  past several days without acute findings.  Ultrasound today does not identify any biliary findings.  Patient appears very uncomfortable and ill.  Diagnostic studies however are fairly stable.  I suspect gastroparesis.  Patient is not endorsing familiarity with this diagnosis.  Will plan for admission and until patient has improved and is tolerating oral intake again.  Final Clinical Impressions(s) / ED Diagnoses   Final diagnoses:  Intractable nausea and vomiting  Type 1 diabetes mellitus with other specified complication [facility])    ED Discharge Orders    None       Charlesetta Shanks, MD 12/06/17 [phone]

## 2017-12-06 NOTE — H&P (Signed)
History and Physical    DOA: 12/06/2017  PCP: [name], NP  Patient coming from: Home  Chief Complaint: Refractory nausea vomiting  HPI: Jennifer Hogan is a 47 y.o. female with history h/o anxiety, depression, hyper cholesterolemia, hypothyroidism, diabetes mellitus type 1 who is dependent on insulin pump, CAD status post PTCA x3 in February 2019 and on dual antiplatelet agents presents today with complaints of refractory nausea and vomiting since 5 days.  Patient reports episodes of severe nausea and vomiting requiring hospitalization for about 5 times in the last 12 years.  Last hospitalization was couple of years back.  Patient states her sugars are usually around 200s on insulin pump.  This particular episode of nausea vomiting started 5 days back and she has been unable to tolerate oral intake or keep anything down since then.  She was seen in the ED 3 days back for these complaints and discharged on symptomatic treatment however patient has not been able to fill her prescription due to her illness.  Today in the ED she has received 4 mg of Zofran, 25 mg of Phenergan, 40 mg of Protonix and 4 mg of morphine with minimal relief.  She has also undergone ultrasonogram gallbladder which is negative for any acute issues.  Given inability to tolerate oral and continued symptoms, she is requested to be admitted for further evaluation and management.   Review of Systems: As per HPI otherwise 10 point review of systems negative.    Past Medical History:  Diagnosis Date  . Acute sinusitis 08/07/2015  . Allergic state 08/07/2015  . Anxiety   . Depression   . Diabetes mellitus   . High cholesterol   . Hypothyroidism   . NSTEMI (non-ST elevated myocardial infarction) ([facility]) 03/17/2017   s/p DES x 3 LAD, DES OM    Past Surgical History:  Procedure Laterality Date  . APPENDECTOMY    . CORONARY STENT INTERVENTION N/A 03/20/2017   Procedure: DES x 3 LAD, DES OM; Surgeon: Burnell Blanks, MD;  Location: [facility];  Service: Cardiovascular;  Laterality: N/A;  . LEFT HEART CATH AND CORONARY ANGIOGRAPHY N/A 03/20/2017   Procedure: LEFT HEART CATH AND CORONARY ANGIOGRAPHY;  Surgeon: Burnell Blanks, MD;  Location: [facility];  Service: Cardiovascular;  Laterality: N/A;    Social history:  reports that she has never smoked. She has never used smokeless tobacco. She reports that she has current or past drug history. Drug: Marijuana. She reports that she does not drink alcohol.   Allergies  Allergen Reactions  . Ciprofloxin Hcl [Ciprofloxacin] Nausea And Vomiting and Other (See Comments)    Severe migraine  . Food Color Red [Red Dye] Diarrhea    Family History  Problem Relation Age of Onset  . Coronary artery disease Father   . Congestive Heart Failure Father   . Asthma Father   . Cancer Mother        T cell lymphoma      Prior to Admission medications   Medication Sig Start Date End Date Taking? Authorizing Provider  albuterol (PROVENTIL HFA;VENTOLIN HFA) 108 (90 Base) MCG/ACT inhaler Inhale 2 puffs into the lungs every 6 (six) hours as needed for wheezing or shortness of breath. 01/17/17  Yes Hoyt Koch, MD  aspirin EC 81 MG EC tablet Take 1 tablet (81 mg total) by mouth daily. 03/21/17  Yes Rai, Ripudeep K, MD  atorvastatin (LIPITOR) 80 MG tablet Take 1 tablet (80 mg  total) by mouth at bedtime. 03/21/17  Yes Rai, Ripudeep K, MD  b complex vitamins tablet Take 1 tablet by mouth daily.   Yes [provider]  Carboxymethylcellul-Glycerin (LUBRICATING EYE DROPS OP) Place 1 drop into both eyes daily as needed (dry eyes).   Yes [provider]  clopidogrel (PLAVIX) 75 MG tablet Take 1 tablet (75 mg total) by mouth daily. 10/08/17  Yes Turner, Eber Hong, MD  FLUoxetine (PROZAC) 10 MG capsule Take 1 capsule (10 mg total) by mouth daily. 07/23/17  Yes [name], NP  FLUoxetine (PROZAC) 40 MG capsule Take 1  capsule (40 mg total) by mouth daily. 07/23/17  Yes [name], NP  HYDROcodone-acetaminophen (NORCO) 5-325 MG tablet Take 1 tablet by mouth every 4 (four) hours as needed (for pain). 12/04/17  Yes Molpus, John, MD  insulin aspart (NOVOLOG) 100 UNIT/ML injection 35 units per insulin pump daily 10/31/17  Yes Renato Shin, MD  Insulin Infusion Pump Supplies (MINIMED INFUSION SET-MMT 399) MISC See admin instructions. Use with Humulin insulin - continuous - basal rate 0.625   Yes [provider]  levothyroxine (SYNTHROID) 100 MCG tablet Take 1 tablet (100 mcg total) by mouth daily before breakfast. 10/04/17  Yes [name], NP  metoprolol succinate (TOPROL XL) 25 MG 24 hr tablet Take 0.5 tablets (12.5 mg total) by mouth daily. 10/08/17  Yes Turner, Eber Hong, MD  montelukast (SINGULAIR) 10 MG tablet Take 1 tablet (10 mg total) by mouth at bedtime. 04/30/17  Yes [name], NP  nitroGLYCERIN (NITROSTAT) 0.4 MG SL tablet Place 1 tablet (0.4 mg total) under the tongue every 5 (five) minutes as needed for chest pain. 10/08/17  Yes Turner, Eber Hong, MD  pantoprazole (PROTONIX) 40 MG tablet Take 1 tablet (40 mg total) by mouth daily. 10/08/17  Yes Turner, Traci R, MD  Continuous Glucose Monitor Sup (ENLITE GLUCOSE SENSOR) MISC 1 Device by Does not apply route once a week. 10/08/14   Renato Shin, MD  glucose blood (BAYER CONTOUR NEXT TEST) test strip Test blood glucose 2-3 times a day. Dx code: 250.01 08/23/12   Renato Shin, MD  ondansetron (ZOFRAN ODT) 8 MG disintegrating tablet Take 1 tablet (8 mg total) by mouth every 8 (eight) hours as needed for nausea or vomiting. Patient not taking: Reported on 12/06/2017 12/04/17   Molpus, Jenny Reichmann, MD  promethazine (PHENERGAN) 25 MG suppository Place 1 suppository (25 mg total) rectally every 6 (six) hours as needed for nausea or vomiting. Patient not taking: Reported on 12/06/2017 12/04/17   Shanon Rosser, MD    Physical Exam: Vitals:   12/06/17 1415  12/06/17 1430 12/06/17 1445 12/06/17 1630  BP: (!) 163/83 (!) 159/92 (!) 179/92 (!) 167/89  Pulse: 92 92 90 93  Resp: (!) 23 (!) 24 20 (!) 22  Temp:      TempSrc:      SpO2: 100% 100% 100% 98%    Constitutional: Moderately built female in moderate distress due to severe nausea Vitals:   12/06/17 1415 12/06/17 1430 12/06/17 1445 12/06/17 1630  BP: (!) 163/83 (!) 159/92 (!) 179/92 (!) 167/89  Pulse: 92 92 90 93  Resp: (!) 23 (!) 24 20 (!) 22  Temp:      TempSrc:      SpO2: 100% 100% 100% 98%   Eyes: PERRL, lids and conjunctivae normal ENMT: Mucous membranes are moist. Posterior pharynx clear of any exudate or lesions.Normal dentition.  Neck: normal, supple, no masses, no thyromegaly  Respiratory: clear to auscultation bilaterally, no wheezing, no crackles. Normal respiratory effort. No accessory muscle use.  Cardiovascular: Regular rate and rhythm, no murmurs / rubs / gallops. No extremity edema. 2+ pedal pulses. No carotid bruits.  Abdomen: Epigastric and right upper quadrant tenderness with no rebound or guarding, no masses palpated. No hepatosplenomegaly. Bowel sounds positive.  Musculoskeletal: no clubbing / cyanosis. No joint deformity upper and lower extremities. Good ROM, no contractures. Normal muscle tone.  Neurologic: CN 2-12 grossly intact. Sensation intact, DTR normal. Strength 5/5 in all 4.  Psychiatric: Normal judgment and insight. Alert and oriented x 3. Normal mood.  SKIN/catheters: no rashes, lesions, ulcers. No induration  Labs on Admission: I have personally reviewed following labs and imaging studies  CBC: Recent Labs  Lab 12/03/17 2214 12/06/17 1056  WBC 13.1* 11.4*  NEUTROABS 12.3* 9.4*  HGB 12.9 14.8  HCT 39.4 44.4  MCV 94.0 89.7  PLT 340 778*   Basic Metabolic Panel: Recent Labs  Lab 12/03/17 2214 12/06/17 1056  NA 137 136  K 3.8 3.6  CL 106 102  CO2 14* 21*  GLUCOSE 324* 213*  BUN 13 17  CREATININE 0.84 0.97  CALCIUM 8.4* 9.9    GFR: Estimated Creatinine Clearance: 63.8 mL/min (by C-G formula based on SCr of 0.97 mg/dL). Liver Function Tests: Recent Labs  Lab 12/03/17 2214 12/06/17 1056  AST 32 32  ALT 21 26  ALKPHOS 78 95  BILITOT 1.5* 1.0  PROT 7.5 8.4*  ALBUMIN 4.2 4.4   Recent Labs  Lab 12/03/17 2214 12/06/17 1056  LIPASE 17 24   No results for input(s): AMMONIA in the last 168 hours. Coagulation Profile: Recent Labs  Lab 12/06/17 1056  INR 0.97   Cardiac Enzymes: No results for input(s): CKTOTAL, CKMB, CKMBINDEX, TROPONINI in the last 168 hours. BNP (last 3 results) No results for input(s): PROBNP in the last 8760 hours. HbA1C: No results for input(s): HGBA1C in the last 72 hours. CBG: Recent Labs  Lab 12/03/17 2206 12/04/17 0140 12/04/17 0358  GLUCAP 335* 96 177*   Lipid Profile: No results for input(s): CHOL, HDL, LDLCALC, TRIG, CHOLHDL, LDLDIRECT in the last 72 hours. Thyroid Function Tests: No results for input(s): TSH, T4TOTAL, FREET4, T3FREE, THYROIDAB in the last 72 hours. Anemia Panel: No results for input(s): VITAMINB12, FOLATE, FERRITIN, TIBC, IRON, RETICCTPCT in the last 72 hours. Urine analysis:    Component Value Date/Time   COLORURINE YELLOW 12/06/2017 1239   APPEARANCEUR HAZY (A) 12/06/2017 1239   LABSPEC 1.017 12/06/2017 1239   PHURINE 6.0 12/06/2017 1239   GLUCOSEU >=500 (A) 12/06/2017 1239   GLUCOSEU NEGATIVE 11/23/2015 0831   HGBUR SMALL (A) 12/06/2017 1239   BILIRUBINUR NEGATIVE 12/06/2017 1239   BILIRUBINUR neg 12/07/2015 0843   KETONESUR 80 (A) 12/06/2017 1239   PROTEINUR 100 (A) 12/06/2017 1239   UROBILINOGEN 1.0 12/07/2015 0843   UROBILINOGEN 0.2 11/23/2015 0831   NITRITE NEGATIVE 12/06/2017 1239   LEUKOCYTESUR NEGATIVE 12/06/2017 1239    Radiological Exams on Admission: US Abdomen Limited  Result Date: 12/06/2017 CLINICAL DATA:  Right upper quadrant pain for 5 days EXAM: ULTRASOUND ABDOMEN LIMITED RIGHT UPPER QUADRANT COMPARISON:  CT  abdomen pelvis of 12/04/2017 FINDINGS: Gallbladder: The gallbladder is visualized and no gallstones are noted. There is no pain over the gallbladder with compression. Common bile duct: Diameter: The common bile duct is normal measuring 4.4 mm in diameter. Liver: The parenchyma of the liver is normal in echogenicity. No focal hepatic abnormality is seen.  Portal vein is patent on color Doppler imaging with normal direction of blood flow towards the liver. IMPRESSION: Negative limited ultrasound of the right upper quadrant. Electronically Signed   By: Ivar Drape M.D.   On: 12/06/2017 12:14    EKG: Independently reviewed.  Sinus rhythm with T wave inversions in aVL aVR V1 and V2.     Assessment and Plan:   1.  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  Will try scheduled Reglan and as needed with promethazine.  Ultrasonogram gallbladder negative for cholecystitis or cholelithiasis.  LFTs okay.  She does have mild elevation in total bilirubin likely related to cholestasis.  Can consider IV erythromycin if no relief with antiemetics.  N.p.o. IV fluids and IV Pepcid ordered.  Advance diet as tolerated. Of note, patient did undergo CT abdomen pelvis on the 12th with no acute findings of infection or bowel obstruction.  Consider GI eval in a.m. if no improvement.   2.  Diabetes mellitus: Will resume insulin pump and I have discussed with diabetes coordinator for managing this.  Blood glucose in the 200s and no evidence of DKA.  3.  CAD: Resume antiplatelet agents.  Patient does have a stent within last year.  If unable to tolerate p.o. may need to change to rectal aspirin.  4.  Asthma: Stable resume MDI  DVT prophylaxis: Low risk  Code Status: Full code  Family Communication: Discussed with patient. Health care proxy would be her father Consults called: None Admission status:  Patient admitted as observation as anticipated LOS less than 2 midnights    Guilford Shi MD [facility] Pager [phone]  If 7PM-7AM, please contact night-coverage [email or web address] Password [email or web address]  12/06/2017, 5:18 PM

## 2017-12-06 NOTE — Progress Notes (Signed)
Inpatient Diabetes Program Recommendations  AACE/ADA: New Consensus Statement on Inpatient Glycemic Control (2015)  Target Ranges:  Prepandial:   less than 140 mg/dL      Peak postprandial:   less than 180 mg/dL (1-2 hours)      Critically ill patients:  140 - 180 mg/dL   Review of Glycemic Control  Diabetes history: DM 1, Sees Dr. Loanne Drilling, Endocrinology Outpatient Diabetes medications:  Current orders for Inpatient glycemic control: Insulin pump order set  Spoke with admitting Dr. Earnest Conroy, Hospitalist about plan of care. Order insulin pump order set per patient's home settings while here.  Per Endocrinology notes from last visit on 06/13/17:  basal rate of 0.65 units/hr, total of 15.6 units in a 24 hour period.   mealtime bolus of 1 unit for every 18 grams of carbohydrate    correction bolus (which some people call "sensitivity," or "insulin sensitivity ratio," or just "isr") of 1 unit for each 60 by which your glucose exceeds 100.    Thanks,  Tama Headings RN, MSN, BC-ADM Inpatient Diabetes Coordinator Team Pager [phone] (8a-5p)

## 2017-12-07 ENCOUNTER — Encounter (HOSPITAL_COMMUNITY): Payer: Self-pay | Admitting: Emergency Medicine

## 2017-12-07 ENCOUNTER — Other Ambulatory Visit: Payer: Self-pay

## 2017-12-07 DIAGNOSIS — E1042 Type 1 diabetes mellitus with diabetic polyneuropathy: Secondary | ICD-10-CM

## 2017-12-07 DIAGNOSIS — K3184 Gastroparesis: Secondary | ICD-10-CM

## 2017-12-07 DIAGNOSIS — I2510 Atherosclerotic heart disease of native coronary artery without angina pectoris: Secondary | ICD-10-CM

## 2017-12-07 DIAGNOSIS — E039 Hypothyroidism, unspecified: Secondary | ICD-10-CM

## 2017-12-07 DIAGNOSIS — E1143 Type 2 diabetes mellitus with diabetic autonomic (poly)neuropathy: Secondary | ICD-10-CM

## 2017-12-07 DIAGNOSIS — I255 Ischemic cardiomyopathy: Secondary | ICD-10-CM

## 2017-12-07 DIAGNOSIS — R1112 Projectile vomiting: Secondary | ICD-10-CM

## 2017-12-07 LAB — CBC
HEMATOCRIT: 42.8 % (ref 36.0–46.0)
HEMOGLOBIN: 14.2 g/dL (ref 12.0–15.0)
MCH: 29.3 pg (ref 26.0–34.0)
MCHC: 33.2 g/dL (ref 30.0–36.0)
MCV: 88.4 fL (ref 80.0–100.0)
NRBC: 0 % (ref 0.0–0.2)
Platelets: 405 10*3/uL — ABNORMAL HIGH (ref 150–400)
RBC: 4.84 MIL/uL (ref 3.87–5.11)
RDW: 12.7 % (ref 11.5–15.5)
WBC: 8.5 10*3/uL (ref 4.0–10.5)

## 2017-12-07 LAB — HEMOGLOBIN A1C
Hgb A1c MFr Bld: 9.7 % — ABNORMAL HIGH (ref 4.8–5.6)
MEAN PLASMA GLUCOSE: 231.69 mg/dL

## 2017-12-07 LAB — GLUCOSE, CAPILLARY
GLUCOSE-CAPILLARY: 191 mg/dL — AB (ref 70–99)
GLUCOSE-CAPILLARY: 172 mg/dL — AB (ref 70–99)
Glucose-Capillary: 201 mg/dL — ABNORMAL HIGH (ref 70–99)
Glucose-Capillary: 250 mg/dL — ABNORMAL HIGH (ref 70–99)

## 2017-12-07 LAB — BASIC METABOLIC PANEL
ANION GAP: 10 (ref 5–15)
BUN: 13 mg/dL (ref 6–20)
CHLORIDE: 103 mmol/L (ref 98–111)
CO2: 24 mmol/L (ref 22–32)
Calcium: 8.9 mg/dL (ref 8.9–10.3)
Creatinine, Ser: 0.97 mg/dL (ref 0.44–1.00)
GFR calc non Af Amer: >60 mL/min (ref >60)
Glucose, Bld: 190 mg/dL — ABNORMAL HIGH (ref 70–99)
POTASSIUM: 3.1 mmol/L — AB (ref 3.5–5.1)
Sodium: 137 mmol/L (ref 135–145)

## 2017-12-07 MED ORDER — FLUOXETINE HCL 10 MG PO CAPS: 10.0000 mg | ORAL_CAPSULE | Freq: Every day | ORAL | 0 refills | Status: DC

## 2017-12-07 MED ORDER — ASPIRIN 81 MG PO TBEC: 81.0000 mg | DELAYED_RELEASE_TABLET | Freq: Every day | ORAL | 0 refills | Status: DC

## 2017-12-07 MED ORDER — FLUOXETINE HCL 40 MG PO CAPS: 40.0000 mg | ORAL_CAPSULE | Freq: Every day | ORAL | 0 refills | Status: DC

## 2017-12-07 MED ORDER — B COMPLEX PO TABS: 1.0000 | ORAL_TABLET | Freq: Every day | ORAL | 0 refills | Status: AC

## 2017-12-07 MED ORDER — METOPROLOL SUCCINATE ER 25 MG PO TB24: 12.5000 mg | ORAL_TABLET | Freq: Every day | ORAL | 0 refills | Status: DC

## 2017-12-07 MED ORDER — CLOPIDOGREL BISULFATE 75 MG PO TABS: 75.0000 mg | ORAL_TABLET | Freq: Every day | ORAL | 0 refills | Status: AC

## 2017-12-07 MED ORDER — NITROGLYCERIN 0.4 MG SL SUBL: 0.4000 mg | SUBLINGUAL_TABLET | SUBLINGUAL | 0 refills | Status: DC | PRN

## 2017-12-07 MED ORDER — MONTELUKAST SODIUM 10 MG PO TABS: 10.0000 mg | ORAL_TABLET | Freq: Every day | ORAL | 0 refills | Status: DC

## 2017-12-07 MED ORDER — ATORVASTATIN CALCIUM 80 MG PO TABS: 80.0000 mg | ORAL_TABLET | Freq: Every day | ORAL | 0 refills | Status: DC

## 2017-12-07 MED ORDER — MAGNESIUM OXIDE 400 (241.3 MG) MG PO TABS
800.0000 mg | ORAL_TABLET | Freq: Once | ORAL | Status: AC
  Administered 2017-12-07: 800 mg via ORAL
  Filled 2017-12-07: qty 2

## 2017-12-07 MED ORDER — LEVOTHYROXINE SODIUM 100 MCG PO TABS: 100.0000 ug | ORAL_TABLET | Freq: Every day | ORAL | 0 refills | Status: DC

## 2017-12-07 MED ORDER — HYDROCODONE-ACETAMINOPHEN 5-325 MG PO TABS: 1.0000 | ORAL_TABLET | ORAL | 0 refills | Status: DC | PRN

## 2017-12-07 MED ORDER — ALBUTEROL SULFATE HFA 108 (90 BASE) MCG/ACT IN AERS: 2.0000 | INHALATION_SPRAY | Freq: Four times a day (QID) | RESPIRATORY_TRACT | 0 refills | Status: DC | PRN

## 2017-12-07 MED ORDER — PANTOPRAZOLE SODIUM 40 MG PO TBEC: 40.0000 mg | DELAYED_RELEASE_TABLET | Freq: Every day | ORAL | 0 refills | Status: DC

## 2017-12-07 MED ORDER — POTASSIUM CHLORIDE 10 MEQ/100ML IV SOLN
10.0000 meq | INTRAVENOUS | Status: AC
  Administered 2017-12-07 (×4): 10 meq via INTRAVENOUS
  Filled 2017-12-07 (×4): qty 100

## 2017-12-07 MED FILL — CLOPIDOGREL 75 MG TABLET: 75 | 30 days supply | Qty: 30 | Fill #0

## 2017-12-07 MED FILL — ASPIRIN LOW DOSE 81 MG TBEC: 81 | 30 days supply | Qty: 30 | Fill #0

## 2017-12-07 MED FILL — B COMPLEX TABLET: 30 days supply | Qty: 30 | Fill #0

## 2017-12-07 MED FILL — PANTOPRAZOLE SOD DR 40 MG T: 40 | 30 days supply | Qty: 30 | Fill #0

## 2017-12-07 MED FILL — NITROGLYCERIN 0.4 MG TAB SL: 0.4 | 7 days supply | Qty: 25 | Fill #0

## 2017-12-07 MED FILL — PROVENTIL HFA 108 (90 BASE): 108 (90 BAS | 20 days supply | Qty: 7 | Fill #0

## 2017-12-07 MED FILL — FLUoxetine HCL 40 MG CAPS: 40 | 30 days supply | Qty: 30 | Fill #0

## 2017-12-07 MED FILL — METOPROLOL SUCCINATE ER 25: 25 | 30 days supply | Qty: 15 | Fill #0

## 2017-12-07 MED FILL — FLUoxetine HCL 10 MG CAPS: 10 | 30 days supply | Qty: 30 | Fill #0

## 2017-12-07 MED FILL — ATORVASTATIN CALCIUM 80 MG: 80 | 30 days supply | Qty: 30 | Fill #0

## 2017-12-07 MED FILL — MONTELUKAST SOD 10 MG TAB: 10 | 30 days supply | Qty: 30 | Fill #0

## 2017-12-07 MED FILL — LEVOTHYROXINE 100 MCG TAB: 100 | 30 days supply | Qty: 30 | Fill #0

## 2017-12-07 NOTE — Progress Notes (Signed)
Pharmacy delivered home meds, now pt can be discharged.

## 2017-12-07 NOTE — Care Management Note (Addendum)
Case Management Note  Patient Details  Name: Jennifer Hogan MRN: [id number] Date of Birth: [birth year]  Subjective/Objective:  47yo female presented with refractory nausea and vomiting.                  Action/Plan: CM consult acknowledged. CM met with patient to discuss transitional needs. Patient lives at home alone, independent with ADLs with no DME in use. 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 PCP verified as: [name] FNP ([facility]); pharmacy of choice: [facility] for discounted Rxs. Patient reports not being able to fill some of her Rxs and would like to utilize [facility] service to ensure Rx are filled prior to transitioning home. Patient indicated her father would provide transportation home. No further needs from CM.   Expected Discharge Date:                  Expected Discharge Plan:  Home/Self Care  In-House Referral:  NA  Discharge planning Services  CM Consult, Medication Assistance  Post Acute Care Choice:  NA Choice offered to:  NA  DME Arranged:  N/A DME Agency:  NA  HH Arranged:  NA HH Agency:  NA  Status of Service:  Completed, signed off  If discussed at [place] of Stay Meetings, dates discussed:    Additional Comments: 12/07/17 @ 1311-Heith Haigler [facility] completed for all Rx sent to [facility]. Medications will be delivered prior to patient transitioning home.   12/07/17 @ 1224-Davone Shinault RNCM- CM verified DC med list with patient. Patient verbalized needing Fluoxetine 23m, Metoprolol succinate 2110mand [facility]uk[facility] and having enough of the remaining medications until she follow-up at [facility].   NatMidge Minium, BSN, NCM-BC, ACM-RN [phone]/15/2019, 10:19 AM

## 2017-12-07 NOTE — Progress Notes (Signed)
Physician Discharge Summary  Jennifer Hogan JJO:[id number] DOB: Dec 08, 1970 DOA: 12/06/2017  PCP: [name], NP  Admit date: 12/06/2017 Discharge date: 12/07/2017  Admitted From: Home Disposition: Home  Recommendations for Outpatient Follow-up:  1. Follow up with PCP in 1-2 weeks 2. Please obtain BMP/CBC in one week your next doctors visit.  3. Prescription given using transitional care pharmacy  Discharge Condition: Stable CODE STATUS: Full Diet recommendation: Diabetic  Brief/Interim Summary:  47 year old with history of anxiety, depression, insulin-dependent diabetes-insulin pump, hypothyroidism, CAD status post PCI came to the hospital complains of nausea and vomiting which had been ongoing for 5 days.  Her labs are overall unremarkable.  She had CT of the abdomen pelvis done couple of days ago which was negative for any acute signs of infection or pathology.  She was diagnosed with diabetic gastroparesis exacerbation therefore will provide supportive care.  Over the course of 24 hours her symptoms significantly improved.  Following day she was tolerating oral diet without any issues. Her hemoglobin A1c was noted to be 9.7.  Diabetic coordinator was consulted.  Education was provided.  At this time patient has reached maximum benefit from hospital stay and stable to be discharged with outpatient follow-up recommendations as stated above.   Discharge Diagnoses:  Active Problems:   Depression   Hypothyroidism   Type 1 diabetes mellitus (HCC)   Gastroparesis due to DM (HCC)   Coronary artery disease   Ischemic cardiomyopathy   Gastroparesis  Refractory nausea and vomiting secondary to diabetic gastroparesis, improved At this time patient is tolerating oral diet with the without any issues.  CT of the abdomen pelvis done on the 12th is negative for any acute intra-abdominal pathology.  Her hemoglobin A1c is noted to be 9.7.  She is well-hydrated at this time.  Advised  that she needs to get her diabetes under control and follow-up outpatient with primary care provider.  She would also benefit from referral to endocrinology eventually.  Ischemic cardiomyopathy with coronary artery disease status post PCI -Currently she is chest pain-free.  No evidence of acute cardiac condition.  Resume her home medications- ASA, Plavix, statin. Toprol XL  Hypothyroidism -Continue Synthroid  Depression -Continue home meds  SCDs Discharge today.    Discharge Instructions   Allergies as of 12/07/2017      Reactions   Ciprofloxin Hcl [ciprofloxacin] Nausea And Vomiting, Other (See Comments)   Severe migraine   Food Color Red [red Dye] Diarrhea      Medication List    STOP taking these medications   LUBRICATING EYE DROPS OP   ondansetron 8 MG disintegrating tablet Commonly known as:  ZOFRAN-ODT   promethazine 25 MG suppository Commonly known as:  PHENERGAN     TAKE these medications   albuterol 108 (90 Base) MCG/ACT inhaler Commonly known as:  PROVENTIL HFA;VENTOLIN HFA Inhale 2 puffs into the lungs every 6 (six) hours as needed for wheezing or shortness of breath.   aspirin 81 MG EC tablet Take 1 tablet (81 mg total) by mouth daily.   atorvastatin 80 MG tablet Commonly known as:  LIPITOR Take 1 tablet (80 mg total) by mouth at bedtime.   b complex vitamins tablet Take 1 tablet by mouth daily.   clopidogrel 75 MG tablet Commonly known as:  PLAVIX Take 1 tablet (75 mg total) by mouth daily.   ENLITE GLUCOSE SENSOR Misc 1 Device by Does not apply route once a week.   FLUoxetine 10 MG capsule Commonly known as:  PROZAC Take 1 capsule (10 mg total) by mouth daily.   FLUoxetine 40 MG capsule Commonly known as:  PROZAC Take 1 capsule (40 mg total) by mouth daily.   glucose blood test strip Test blood glucose 2-3 times a day. Dx code: 250.01   HYDROcodone-acetaminophen 5-325 MG tablet Commonly known as:  NORCO/VICODIN Take 1 tablet by  mouth every 4 (four) hours as needed (for pain).   insulin aspart 100 UNIT/ML injection Commonly known as:  novoLOG 35 units per insulin pump daily   levothyroxine 100 MCG tablet Commonly known as:  SYNTHROID, LEVOTHROID Take 1 tablet (100 mcg total) by mouth daily before breakfast.   metoprolol succinate 25 MG 24 hr tablet Commonly known as:  TOPROL-XL Take 0.5 tablets (12.5 mg total) by mouth daily.   MINIMED INFUSION SET-MMT 1 Misc See admin instructions. Use with Humulin insulin - continuous - basal rate 0.625   montelukast 10 MG tablet Commonly known as:  SINGULAIR Take 1 tablet (10 mg total) by mouth at bedtime.   nitroGLYCERIN 0.4 MG SL tablet Commonly known as:  NITROSTAT Place 1 tablet (0.4 mg total) under the tongue every 5 (five) minutes as needed for chest pain.   pantoprazole 40 MG tablet Commonly known as:  PROTONIX Take 1 tablet (40 mg total) by mouth daily.      Follow-up Information    [name], NP. Schedule an appointment as soon as possible for a visit in 1 week(s).   Specialty:  Nurse Practitioner Contact information: [place] [address] [phone]        [name], MD .   Specialty:  Cardiology Contact information: [address] [place] [address] [phone]          Allergies  Allergen Reactions  . Ciprofloxin Hcl [Ciprofloxacin] Nausea And Vomiting and Other (See Comments)    Severe migraine  . Food Color Red [Red Dye] Diarrhea    You were cared for by a hospitalist during your hospital stay. If you have any questions about your discharge medications or the care you received while you were in the hospital after you are discharged, you can call the unit and asked to speak with the hospitalist on call if the hospitalist that took care of you is not available. Once you are discharged, your primary care physician will handle any further medical issues. Please note that no refills for  any discharge medications will be authorized once you are discharged, as it is imperative that you return to your primary care physician (or establish a relationship with a primary care physician if you do not have one) for your aftercare needs so that they can reassess your need for medications and monitor your lab values.  Consultations:  Diabetic Coordinator   Procedures/Studies: Ct Abdomen Pelvis W Contrast  Result Date: 12/04/2017 CLINICAL DATA:  Initial evaluation for acute upper abdominal pain, nausea, vomiting, diarrhea. EXAM: CT ABDOMEN AND PELVIS WITH CONTRAST TECHNIQUE: Multidetector CT imaging of the abdomen and pelvis was performed using the standard protocol following bolus administration of intravenous contrast. CONTRAST:  171mL ISOVUE-300 IOPAMIDOL (ISOVUE-300) INJECTION 61% COMPARISON:  Prior CT from 12/31/2016. FINDINGS: Lower chest: Minimal linear atelectatic changes at the left lung base. Visualized lungs are otherwise clear. Hepatobiliary: Liver demonstrates a normal contrast enhanced appearance. Gallbladder within normal limits. No biliary dilatation. Pancreas: Pancreas within normal limits. Spleen: Spleen within normal limits. Adrenals/Urinary Tract: Adrenal glands are normal. Kidneys equal size with symmetric  enhancement. No nephrolithiasis, hydronephrosis, or focal enhancing renal mass. No appreciable hydroureter. Bladder moderately distended without acute abnormality. Stomach/Bowel: Stomach decompressed without acute abnormality. No evidence for bowel obstruction. Appendix is surgically absent. Colon diffusely decompressed. No acute inflammatory changes about the bowels. Vascular/Lymphatic: Normal intravascular enhancement seen throughout the intra-abdominal aorta. Mild aorto bi-iliac atherosclerotic disease. No aneurysm. Mesenteric vessels patent proximally. No adenopathy. Reproductive: Uterus and ovaries within normal limits. Other: No free air or fluid. Musculoskeletal: No  acute osseus abnormality. No lytic or blastic osseous lesions. Moderate degenerative spondylolysis at L3-4 and L4-5. Small benign bone island noted within the right sacral ala. IMPRESSION: 1. No CT evidence for acute intra-abdominal or pelvic process. 2. Prior appendectomy. 3. Mild aorto bi-iliac atherosclerotic disease.  No aneurysm. 4. Moderate degenerative spondylolysis at L3-4 and L4-5. Electronically Signed   By: Jeannine Boga M.D.   On: 12/04/2017 05:49   US Abdomen Limited  Result Date: 12/06/2017 CLINICAL DATA:  Right upper quadrant pain for 5 days EXAM: ULTRASOUND ABDOMEN LIMITED RIGHT UPPER QUADRANT COMPARISON:  CT abdomen pelvis of 12/04/2017 FINDINGS: Gallbladder: The gallbladder is visualized and no gallstones are noted. There is no pain over the gallbladder with compression. Common bile duct: Diameter: The common bile duct is normal measuring 4.4 mm in diameter. Liver: The parenchyma of the liver is normal in echogenicity. No focal hepatic abnormality is seen. Portal vein is patent on color Doppler imaging with normal direction of blood flow towards the liver. IMPRESSION: Negative limited ultrasound of the right upper quadrant. Electronically Signed   By: Ivar Drape M.D.   On: 12/06/2017 12:14     Subjective: Doing well, tolerating oral.   General = no fevers, chills, dizziness, malaise, fatigue HEENT/EYES = negative for pain, redness, loss of vision, double vision, blurred vision, loss of hearing, sore throat, hoarseness, dysphagia Cardiovascular= negative for chest pain, palpitation, murmurs, lower extremity swelling Respiratory/lungs= negative for shortness of breath, cough, hemoptysis, wheezing, mucus production Gastrointestinal= negative for nausea, vomiting,, abdominal pain, melena, hematemesis Genitourinary= negative for Dysuria, Hematuria, Change in Urinary Frequency MSK = Negative for arthralgia, myalgias, Back Pain, Joint swelling  Neurology= Negative for headache,  seizures, numbness, tingling  Psychiatry= Negative for anxiety, depression, suicidal and homocidal ideation Allergy/Immunology= Medication/Food allergy as listed  Skin= Negative for Rash, lesions, ulcers, itching   Discharge Exam: Vitals:   12/06/17 2319 12/07/17 0556  BP: (!) 168/90 126/82  Pulse: 89 78  Resp: 18 18  Temp: 98.3 F (36.8 C) (!) 97.5 F (36.4 C)  SpO2: 99% 98%   Vitals:   12/06/17 1826 12/06/17 2237 12/06/17 2319 12/07/17 0556  BP: (!) 175/92 (!) 184/95 (!) 168/90 126/82  Pulse: 97  89 78  Resp: 18  18 18   Temp: 99.1 F (37.3 C)  98.3 F (36.8 C) (!) 97.5 F (36.4 C)  TempSrc: Oral  Oral Oral  SpO2: 100%  99% 98%    General: Pt is alert, awake, not in acute distress Cardiovascular: RRR, S1/S2 +, no rubs, no gallops Respiratory: CTA bilaterally, no wheezing, no rhonchi Abdominal: Soft, NT, ND, bowel sounds + Extremities: no edema, no cyanosis    The results of significant diagnostics from this hospitalization (including imaging, microbiology, ancillary and laboratory) are listed below for reference.     Microbiology: No results found for this or any previous visit (from the past 240 hour(s)).   Labs: BNP (last 3 results) No results for input(s): BNP in the last 8760 hours. Basic Metabolic Panel: Recent Labs  Lab 12/03/17 2214 12/06/17 1056 12/07/17 0601  NA 137 136 137  K 3.8 3.6 3.1*  CL 106 102 103  CO2 14* 21* 24  GLUCOSE 324* 213* 190*  BUN 13 17 13   CREATININE 0.84 0.97 0.97  CALCIUM 8.4* 9.9 8.9   Liver Function Tests: Recent Labs  Lab 12/03/17 2214 12/06/17 1056  AST 32 32  ALT 21 26  ALKPHOS 78 95  BILITOT 1.5* 1.0  PROT 7.5 8.4*  ALBUMIN 4.2 4.4   Recent Labs  Lab 12/03/17 2214 12/06/17 1056  LIPASE 17 24   No results for input(s): AMMONIA in the last 168 hours. CBC: Recent Labs  Lab 12/03/17 2214 12/06/17 1056 12/07/17 0601  WBC 13.1* 11.4* 8.5  NEUTROABS 12.3* 9.4*  --   HGB 12.9 14.8 14.2  HCT 39.4  44.4 42.8  MCV 94.0 89.7 88.4  PLT 340 404* 405*   Cardiac Enzymes: No results for input(s): CKTOTAL, CKMB, CKMBINDEX, TROPONINI in the last 168 hours. BNP: Invalid input(s): POCBNP CBG: Recent Labs  Lab 12/04/17 0358 12/06/17 1934 12/07/17 0010 12/07/17 0404 12/07/17 0741  GLUCAP 177* 210* 201* 191* 172*   D-Dimer No results for input(s): DDIMER in the last 72 hours. Hgb A1c Recent Labs    12/07/17 0601  HGBA1C 9.7*   Lipid Profile No results for input(s): CHOL, HDL, LDLCALC, TRIG, CHOLHDL, LDLDIRECT in the last 72 hours. Thyroid function studies No results for input(s): TSH, T4TOTAL, T3FREE, THYROIDAB in the last 72 hours.  Invalid input(s): FREET3 Anemia work up No results for input(s): VITAMINB12, FOLATE, FERRITIN, TIBC, IRON, RETICCTPCT in the last 72 hours. Urinalysis    Component Value Date/Time   COLORURINE YELLOW 12/06/2017 1239   APPEARANCEUR HAZY (A) 12/06/2017 1239   LABSPEC 1.017 12/06/2017 1239   PHURINE 6.0 12/06/2017 1239   GLUCOSEU >=500 (A) 12/06/2017 1239   GLUCOSEU NEGATIVE 11/23/2015 0831   HGBUR SMALL (A) 12/06/2017 1239   BILIRUBINUR NEGATIVE 12/06/2017 1239   BILIRUBINUR neg 12/07/2015 0843   KETONESUR 80 (A) 12/06/2017 1239   PROTEINUR 100 (A) 12/06/2017 1239   UROBILINOGEN 1.0 12/07/2015 0843   UROBILINOGEN 0.2 11/23/2015 0831   NITRITE NEGATIVE 12/06/2017 1239   LEUKOCYTESUR NEGATIVE 12/06/2017 1239   Sepsis Labs Invalid input(s): PROCALCITONIN,  WBC,  LACTICIDVEN Microbiology No results found for this or any previous visit (from the past 240 hour(s)).   Time coordinating discharge:  I have spent 35 minutes face to face with the patient and on the ward discussing the patients care, assessment, plan and disposition with other care givers. >50% of the time was devoted counseling the patient about the risks and benefits of treatment/Discharge disposition and coordinating care.   SIGNED:   Damita Lack, MD  [facility] 12/07/2017, 11:54 AM Pager   If 7PM-7AM, please contact night-coverage [email or web address] Password [email or web address]

## 2017-12-08 NOTE — Discharge Summary (Signed)
For discharge summary refer to "Progress Note" on 12/07/17 11:51 am. Mistakenly labeled as progress note.

## 2017-12-31 ENCOUNTER — Inpatient Hospital Stay (HOSPITAL_COMMUNITY)
Admission: EM | Admit: 2017-12-31 | Discharge: 2018-01-02 | DRG: 639 | Disposition: A | Discharge status: Home / Self Care | Payer: No Typology Code available for payment source | Attending: Internal Medicine | Admitting: Internal Medicine

## 2017-12-31 ENCOUNTER — Encounter (HOSPITAL_COMMUNITY): Payer: Self-pay | Admitting: Emergency Medicine

## 2017-12-31 ENCOUNTER — Other Ambulatory Visit: Payer: Self-pay

## 2017-12-31 DIAGNOSIS — E1143 Type 2 diabetes mellitus with diabetic autonomic (poly)neuropathy: Secondary | ICD-10-CM

## 2017-12-31 DIAGNOSIS — I2510 Atherosclerotic heart disease of native coronary artery without angina pectoris: Secondary | ICD-10-CM | POA: Diagnosis present

## 2017-12-31 DIAGNOSIS — Z794 Long term (current) use of insulin: Secondary | ICD-10-CM

## 2017-12-31 DIAGNOSIS — R197 Diarrhea, unspecified: Secondary | ICD-10-CM

## 2017-12-31 DIAGNOSIS — Z881 Allergy status to other antibiotic agents status: Secondary | ICD-10-CM

## 2017-12-31 DIAGNOSIS — Z9049 Acquired absence of other specified parts of digestive tract: Secondary | ICD-10-CM

## 2017-12-31 DIAGNOSIS — I252 Old myocardial infarction: Secondary | ICD-10-CM

## 2017-12-31 DIAGNOSIS — K3184 Gastroparesis: Secondary | ICD-10-CM | POA: Diagnosis present

## 2017-12-31 DIAGNOSIS — Z955 Presence of coronary angioplasty implant and graft: Secondary | ICD-10-CM

## 2017-12-31 DIAGNOSIS — F1210 Cannabis abuse, uncomplicated: Secondary | ICD-10-CM | POA: Diagnosis present

## 2017-12-31 DIAGNOSIS — E785 Hyperlipidemia, unspecified: Secondary | ICD-10-CM | POA: Diagnosis present

## 2017-12-31 DIAGNOSIS — E860 Dehydration: Secondary | ICD-10-CM | POA: Diagnosis present

## 2017-12-31 DIAGNOSIS — F419 Anxiety disorder, unspecified: Secondary | ICD-10-CM | POA: Diagnosis present

## 2017-12-31 DIAGNOSIS — E1043 Type 1 diabetes mellitus with diabetic autonomic (poly)neuropathy: Secondary | ICD-10-CM | POA: Diagnosis present

## 2017-12-31 DIAGNOSIS — Z79899 Other long term (current) drug therapy: Secondary | ICD-10-CM

## 2017-12-31 DIAGNOSIS — F329 Major depressive disorder, single episode, unspecified: Secondary | ICD-10-CM

## 2017-12-31 DIAGNOSIS — E1110 Type 2 diabetes mellitus with ketoacidosis without coma: Secondary | ICD-10-CM | POA: Diagnosis present

## 2017-12-31 DIAGNOSIS — R339 Retention of urine, unspecified: Secondary | ICD-10-CM

## 2017-12-31 DIAGNOSIS — Z9641 Presence of insulin pump (external) (internal): Secondary | ICD-10-CM | POA: Diagnosis present

## 2017-12-31 DIAGNOSIS — E039 Hypothyroidism, unspecified: Secondary | ICD-10-CM | POA: Diagnosis present

## 2017-12-31 DIAGNOSIS — Z23 Encounter for immunization: Secondary | ICD-10-CM

## 2017-12-31 DIAGNOSIS — F32A Depression, unspecified: Secondary | ICD-10-CM

## 2017-12-31 DIAGNOSIS — R112 Nausea with vomiting, unspecified: Secondary | ICD-10-CM

## 2017-12-31 DIAGNOSIS — E1010 Type 1 diabetes mellitus with ketoacidosis without coma: Principal | ICD-10-CM | POA: Diagnosis present

## 2017-12-31 DIAGNOSIS — Z7982 Long term (current) use of aspirin: Secondary | ICD-10-CM

## 2017-12-31 DIAGNOSIS — E109 Type 1 diabetes mellitus without complications: Secondary | ICD-10-CM | POA: Diagnosis present

## 2017-12-31 DIAGNOSIS — Z8249 Family history of ischemic heart disease and other diseases of the circulatory system: Secondary | ICD-10-CM

## 2017-12-31 DIAGNOSIS — Z7902 Long term (current) use of antithrombotics/antiplatelets: Secondary | ICD-10-CM

## 2017-12-31 DIAGNOSIS — Z9102 Food additives allergy status: Secondary | ICD-10-CM

## 2017-12-31 HISTORY — DX: Type 2 diabetes mellitus with ketoacidosis without coma: E11.10

## 2017-12-31 HISTORY — DX: Diarrhea, unspecified: R19.7

## 2017-12-31 HISTORY — DX: Atherosclerotic heart disease of native coronary artery without angina pectoris: I25.10

## 2017-12-31 HISTORY — DX: Vomiting, unspecified: R11.10

## 2017-12-31 LAB — URINALYSIS, ROUTINE W REFLEX MICROSCOPIC
Glucose, UA: >=500 mg/dL — AB
Hgb urine dipstick: NEGATIVE
LEUKOCYTES UA: NEGATIVE
NITRITE: NEGATIVE
PH: 5.5 (ref 5.0–8.0)
Protein, ur: NEGATIVE mg/dL
SPECIFIC GRAVITY, URINE: 1.02 (ref 1.005–1.030)

## 2017-12-31 LAB — CBC WITH DIFFERENTIAL/PLATELET
Abs Immature Granulocytes: 0.07 10*3/uL (ref 0.00–0.07)
BASOS ABS: 0 10*3/uL (ref 0.0–0.1)
Basophils Relative: 0 %
EOS ABS: 0 10*3/uL (ref 0.0–0.5)
Eosinophils Relative: 0 %
HEMATOCRIT: 39 % (ref 36.0–46.0)
Hemoglobin: 12.7 g/dL (ref 12.0–15.0)
IMMATURE GRANULOCYTES: 1 %
LYMPHS ABS: 1.1 10*3/uL (ref 0.7–4.0)
Lymphocytes Relative: 8 %
MCH: 29.8 pg (ref 26.0–34.0)
MCHC: 32.6 g/dL (ref 30.0–36.0)
MCV: 91.5 fL (ref 80.0–100.0)
Monocytes Absolute: 0.6 10*3/uL (ref 0.1–1.0)
Monocytes Relative: 4 %
NEUTROS PCT: 87 %
NRBC: 0 % (ref 0.0–0.2)
Neutro Abs: 12.4 10*3/uL — ABNORMAL HIGH (ref 1.7–7.7)
PLATELETS: 522 10*3/uL — AB (ref 150–400)
RBC: 4.26 MIL/uL (ref 3.87–5.11)
RDW: 13.8 % (ref 11.5–15.5)
WBC: 14.3 10*3/uL — ABNORMAL HIGH (ref 4.0–10.5)

## 2017-12-31 LAB — BASIC METABOLIC PANEL
Anion gap: 12 (ref 5–15)
Anion gap: 10 (ref 5–15)
BUN: 18 mg/dL (ref 6–20)
BUN: 16 mg/dL (ref 6–20)
CO2: 18 mmol/L — ABNORMAL LOW (ref 22–32)
CO2: 20 mmol/L — ABNORMAL LOW (ref 22–32)
Calcium: 8.1 mg/dL — ABNORMAL LOW (ref 8.9–10.3)
Calcium: 8.1 mg/dL — ABNORMAL LOW (ref 8.9–10.3)
Chloride: 106 mmol/L (ref 98–111)
Chloride: 107 mmol/L (ref 98–111)
Creatinine, Ser: 1.08 mg/dL — ABNORMAL HIGH (ref 0.44–1.00)
Creatinine, Ser: 0.89 mg/dL (ref 0.44–1.00)
GFR calc Af Amer: >60 mL/min (ref >60)
GFR calc Af Amer: >60 mL/min (ref >60)
GFR calc non Af Amer: >60 mL/min (ref >60)
GFR calc non Af Amer: >60 mL/min (ref >60)
Glucose, Bld: 234 mg/dL — ABNORMAL HIGH (ref 70–99)
Glucose, Bld: 151 mg/dL — ABNORMAL HIGH (ref 70–99)
POTASSIUM: 3.7 mmol/L (ref 3.5–5.1)
Potassium: 3.9 mmol/L (ref 3.5–5.1)
Sodium: 136 mmol/L (ref 135–145)
Sodium: 137 mmol/L (ref 135–145)

## 2017-12-31 LAB — I-STAT VENOUS BLOOD GAS, ED
ACID-BASE DEFICIT: 9 mmol/L — AB (ref 0.0–2.0)
Bicarbonate: 16.1 mmol/L — ABNORMAL LOW (ref 20.0–28.0)
O2 Saturation: 37 %
PCO2 VEN: 32.9 mmHg — AB (ref 44.0–60.0)
PO2 VEN: 24 mmHg — AB (ref 32.0–45.0)
TCO2: 17 mmol/L — ABNORMAL LOW (ref 22–32)
pH, Ven: 7.298 (ref 7.250–7.430)

## 2017-12-31 LAB — RAPID URINE DRUG SCREEN, HOSP PERFORMED
Amphetamines: NOT DETECTED
Barbiturates: NOT DETECTED
Benzodiazepines: NOT DETECTED
Cocaine: NOT DETECTED
Opiates: NOT DETECTED
Tetrahydrocannabinol: POSITIVE — AB

## 2017-12-31 LAB — URINALYSIS, MICROSCOPIC (REFLEX)

## 2017-12-31 LAB — GLUCOSE, CAPILLARY
GLUCOSE-CAPILLARY: 134 mg/dL — AB (ref 70–99)
GLUCOSE-CAPILLARY: 138 mg/dL — AB (ref 70–99)
GLUCOSE-CAPILLARY: 165 mg/dL — AB (ref 70–99)
Glucose-Capillary: 241 mg/dL — ABNORMAL HIGH (ref 70–99)
Glucose-Capillary: 167 mg/dL — ABNORMAL HIGH (ref 70–99)
Glucose-Capillary: 188 mg/dL — ABNORMAL HIGH (ref 70–99)

## 2017-12-31 LAB — I-STAT BETA HCG BLOOD, ED (MC, WL, AP ONLY): I-stat hCG, quantitative: <5 m[IU]/mL (ref <5)

## 2017-12-31 LAB — I-STAT CHEM 8, ED
BUN: 28 mg/dL — AB (ref 6–20)
CREATININE: 0.9 mg/dL (ref 0.44–1.00)
Calcium, Ion: 1.17 mmol/L (ref 1.15–1.40)
Chloride: 106 mmol/L (ref 98–111)
Glucose, Bld: 325 mg/dL — ABNORMAL HIGH (ref 70–99)
HCT: 41 % (ref 36.0–46.0)
Hemoglobin: 13.9 g/dL (ref 12.0–15.0)
Potassium: 4.5 mmol/L (ref 3.5–5.1)
Sodium: 135 mmol/L (ref 135–145)
TCO2: 19 mmol/L — ABNORMAL LOW (ref 22–32)

## 2017-12-31 LAB — CBG MONITORING, ED
GLUCOSE-CAPILLARY: 323 mg/dL — AB (ref 70–99)
GLUCOSE-CAPILLARY: 255 mg/dL — AB (ref 70–99)

## 2017-12-31 LAB — ACETAMINOPHEN LEVEL: Acetaminophen (Tylenol), Serum: <10 ug/mL — ABNORMAL LOW (ref 10–30)

## 2017-12-31 LAB — MAGNESIUM: MAGNESIUM: 2.3 mg/dL (ref 1.7–2.4)

## 2017-12-31 LAB — TSH: TSH: 2.287 u[IU]/mL (ref 0.350–4.500)

## 2017-12-31 LAB — T4, FREE: Free T4: 0.86 ng/dL (ref 0.82–1.77)

## 2017-12-31 LAB — MRSA PCR SCREENING: MRSA by PCR: NEGATIVE

## 2017-12-31 MED ORDER — ATORVASTATIN CALCIUM 80 MG PO TABS
80.0000 mg | ORAL_TABLET | Freq: Every day | ORAL | Status: DC
  Administered 2017-12-31 – 2018-01-01 (×2): 80 mg via ORAL
  Filled 2017-12-31 (×3): qty 1

## 2017-12-31 MED ORDER — HYDROMORPHONE HCL 1 MG/ML IJ SOLN
0.5000 mg | Freq: Once | INTRAMUSCULAR | Status: AC
  Administered 2017-12-31: 0.5 mg via INTRAVENOUS
  Filled 2017-12-31: qty 1

## 2017-12-31 MED ORDER — ASPIRIN 81 MG PO TBEC: 81.0000 mg | DELAYED_RELEASE_TABLET | Freq: Every day | ORAL | Status: DC

## 2017-12-31 MED ORDER — INSULIN REGULAR(HUMAN) IN NACL 100-0.9 UT/100ML-% IV SOLN
INTRAVENOUS | Status: DC
  Administered 2017-12-31: 1.8 [IU]/h via INTRAVENOUS
  Administered 2018-01-01: 0.5 [IU]/h via INTRAVENOUS
  Filled 2017-12-31: qty 100

## 2017-12-31 MED ORDER — FLUOXETINE HCL 10 MG PO CAPS
10.0000 mg | ORAL_CAPSULE | Freq: Every day | ORAL | Status: DC
  Administered 2017-12-31 – 2018-01-02 (×2): 10 mg via ORAL
  Filled 2017-12-31 (×4): qty 1

## 2017-12-31 MED ORDER — ONDANSETRON HCL 4 MG/2ML IJ SOLN
4.0000 mg | Freq: Once | INTRAMUSCULAR | Status: AC
  Administered 2017-12-31: 4 mg via INTRAVENOUS
  Filled 2017-12-31: qty 2

## 2017-12-31 MED ORDER — INSULIN REGULAR(HUMAN) IN NACL 100-0.9 UT/100ML-% IV SOLN: INTRAVENOUS | Status: DC

## 2017-12-31 MED ORDER — CLOPIDOGREL BISULFATE 75 MG PO TABS
75.0000 mg | ORAL_TABLET | Freq: Every day | ORAL | Status: DC
  Administered 2017-12-31 – 2018-01-02 (×3): 75 mg via ORAL
  Filled 2017-12-31 (×3): qty 1

## 2017-12-31 MED ORDER — INFLUENZA VAC SPLIT QUAD 0.5 ML IM SUSY
0.5000 mL | PREFILLED_SYRINGE | INTRAMUSCULAR | Status: AC
  Administered 2018-01-01: 0.5 mL via INTRAMUSCULAR
  Filled 2017-12-31: qty 0.5

## 2017-12-31 MED ORDER — ENOXAPARIN SODIUM 40 MG/0.4ML ~~LOC~~ SOLN
40.0000 mg | SUBCUTANEOUS | Status: DC
  Administered 2017-12-31 – 2018-01-01 (×2): 40 mg via SUBCUTANEOUS
  Filled 2017-12-31 (×4): qty 0.4

## 2017-12-31 MED ORDER — LEVOTHYROXINE SODIUM 100 MCG PO TABS
100.0000 ug | ORAL_TABLET | Freq: Every day | ORAL | Status: DC
  Administered 2018-01-01 – 2018-01-02 (×2): 100 ug via ORAL
  Filled 2017-12-31 (×2): qty 1

## 2017-12-31 MED ORDER — ALBUTEROL SULFATE (2.5 MG/3ML) 0.083% IN NEBU: 3.0000 mL | INHALATION_SOLUTION | Freq: Four times a day (QID) | RESPIRATORY_TRACT | Status: DC | PRN

## 2017-12-31 MED ORDER — DEXTROSE-NACL 5-0.45 % IV SOLN
INTRAVENOUS | Status: DC
  Administered 2017-12-31: 125 mL/h via INTRAVENOUS
  Administered 2017-12-31 – 2018-01-01 (×2): via INTRAVENOUS

## 2017-12-31 MED ORDER — PANTOPRAZOLE SODIUM 40 MG PO TBEC
40.0000 mg | DELAYED_RELEASE_TABLET | Freq: Every day | ORAL | Status: DC
  Administered 2018-01-01 – 2018-01-02 (×2): 40 mg via ORAL
  Filled 2017-12-31 (×2): qty 1

## 2017-12-31 MED ORDER — ASPIRIN EC 81 MG PO TBEC
81.0000 mg | DELAYED_RELEASE_TABLET | Freq: Every day | ORAL | Status: DC
  Administered 2017-12-31 – 2018-01-02 (×3): 81 mg via ORAL
  Filled 2017-12-31 (×3): qty 1

## 2017-12-31 MED ORDER — SODIUM CHLORIDE 0.9 % IV BOLUS
1000.0000 mL | Freq: Once | INTRAVENOUS | Status: AC
  Administered 2017-12-31: 1000 mL via INTRAVENOUS

## 2017-12-31 MED ORDER — MONTELUKAST SODIUM 10 MG PO TABS
10.0000 mg | ORAL_TABLET | Freq: Every day | ORAL | Status: DC
  Administered 2017-12-31 – 2018-01-01 (×2): 10 mg via ORAL
  Filled 2017-12-31 (×3): qty 1

## 2017-12-31 MED ORDER — SODIUM CHLORIDE 0.9 % IV SOLN
INTRAVENOUS | Status: DC
  Administered 2017-12-31: 12:00:00 via INTRAVENOUS

## 2017-12-31 MED ORDER — SODIUM CHLORIDE 0.9 % IV SOLN
INTRAVENOUS | Status: AC
  Administered 2017-12-31: 14:00:00 via INTRAVENOUS

## 2017-12-31 MED ORDER — FLUOXETINE HCL 20 MG PO CAPS
40.0000 mg | ORAL_CAPSULE | Freq: Every day | ORAL | Status: DC
  Administered 2017-12-31 – 2018-01-02 (×3): 40 mg via ORAL
  Filled 2017-12-31 (×4): qty 2

## 2017-12-31 MED ORDER — METOPROLOL SUCCINATE ER 25 MG PO TB24
12.5000 mg | ORAL_TABLET | Freq: Every day | ORAL | Status: DC
  Administered 2017-12-31 – 2018-01-02 (×3): 12.5 mg via ORAL
  Filled 2017-12-31 (×3): qty 1

## 2017-12-31 MED ORDER — SODIUM CHLORIDE 0.9 % IV SOLN: INTRAVENOUS | Status: DC

## 2017-12-31 MED ORDER — POTASSIUM CHLORIDE 10 MEQ/100ML IV SOLN
10.0000 meq | INTRAVENOUS | Status: AC
  Administered 2017-12-31: 10 meq via INTRAVENOUS
  Filled 2017-12-31: qty 100

## 2017-12-31 NOTE — Progress Notes (Signed)
Pt admitted to 5w19 from ED and connected to telemetry-SR.  Pt given admission instructions and admit booklet. Safety discussed with pt understanding verbalized.  Plan of care discussed for remander of shift.  Pt removed her insulin pump at 1630.  Insulin drip to start.

## 2017-12-31 NOTE — H&P (Signed)
History and Physical    Jennifer Hogan GXQ:[id number] DOB: 10/21/1970 DOA: 12/31/2017  PCP: [name], NP Consultants:  Loanne Drilling - endocrinology; Turner - cardiology Patient coming from:  Home - lives alone; NOK: Father, [phone]  Chief Complaint: DKA  HPI: Jennifer Hogan is a 47 y.o. female with medical history significant of CAD s/p DES x 4 in 2/19; hypothyroidism; HLD; and DM presenting with DKA.  Saturday, she was fine upon awakening.  She ate a salad from [place] and her "stomach went nuts".  She knew the feeling well - she has had recurrent n/v/d.  It worsened, wasn't able to keep anything down.  She was throwing up everything.  Last emesis was about 10 minutes ago.  Last diarrhea was Saturday.  Generally, once she starts vomiting it just won't stop.  Last DKA was maybe 20 years ago.  Maybe Sunday, her pump got yanked out of her skin.  She was using shots manually that day and they didn't seem to help - she ran in 400s all day.   ED Course:  Type 1 DM - ill since Saturday with n/v/d.  Reports compliance.  Unable to control glucose.  Glu 350 this AM.  +urinary retention, no h/o prior, 1000cc returned.  Took insulin this AM, on drip, given IVF.  Told nurse she had SI - but reported to MD no strong support system and depression without frank SI.  Review of Systems: As per HPI; otherwise review of systems reviewed and negative.   Ambulatory Status:  Ambulates without assistance  Past Medical History:  Diagnosis Date  . Acute sinusitis 08/07/2015  . Allergic state 08/07/2015  . Anxiety   . Depression   . Diabetes mellitus   . High cholesterol   . Hypothyroidism   . NSTEMI (non-ST elevated myocardial infarction) ([place]) 03/17/2017   s/p DES x 3 LAD, DES OM    Past Surgical History:  Procedure Laterality Date  . APPENDECTOMY    . CORONARY STENT INTERVENTION N/A 03/20/2017   Procedure: DES x 3 LAD, DES OM; Surgeon: Burnell Blanks, MD;  Location: [place]  [facility];  Service: Cardiovascular;  Laterality: N/A;  . LEFT HEART CATH AND CORONARY ANGIOGRAPHY N/A 03/20/2017   Procedure: LEFT HEART CATH AND CORONARY ANGIOGRAPHY;  Surgeon: Burnell Blanks, MD;  Location: [place] [facility];  Service: Cardiovascular;  Laterality: N/A;    Social History   Socioeconomic History  . Marital status: Divorced    Spouse name: Not on file  . Number of children: Not on file  . Years of education: Not on file  . Highest education level: Not on file  Occupational History  . 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 Financial resource strain: Somewhat hard  . Food insecurity:    Worry: Never true    Inability: Never true  . Transportation needs:    Medical: No    Non-medical: No  Tobacco Use  . Smoking status: Never Smoker  . Smokeless tobacco: Never Used  . Tobacco comment: Use marijuana  Substance and Sexual Activity  . Alcohol use: No    Alcohol/week: 0.0 standard drinks  . Drug use: Yes    Types: Marijuana    Comment: last use last week, occaisonal marijuana use  . Sexual activity: Not Currently    Birth control/protection: None  Lifestyle  . Physical activity:    Days per week: Not on file    Minutes per session:  Not on file  . Stress: Not on file  Relationships  . Social connections:    Talks on phone: Patient refused    Gets together: Patient refused    Attends religious service: Patient refused    Active member of club or organization: Patient refused    Attends meetings of clubs or organizations: Patient refused    Relationship status: Patient refused  . Intimate partner violence:    Fear of current or ex partner: No    Emotionally abused: No    Physically abused: No    Forced sexual activity: No  Other Topics Concern  . Not on file  Social History Narrative  . Not on file    Allergies  Allergen Reactions  . Ciprofloxin Hcl [Ciprofloxacin] Nausea And Vomiting and Other (See Comments)    Severe  migraine  . Food Color Red [Red Dye] Diarrhea    Family History  Problem Relation Age of Onset  . Coronary artery disease Father   . Congestive Heart Failure Father   . Asthma Father   . Cancer Mother        T cell lymphoma    Prior to Admission medications   Medication Sig Start Date End Date Taking? Authorizing Provider  albuterol (PROVENTIL HFA;VENTOLIN HFA) 108 (90 Base) MCG/ACT inhaler Inhale 2 puffs into the lungs every 6 (six) hours as needed for wheezing or shortness of breath. 12/07/17 01/06/18 Yes Amin, Jeanella Flattery, MD  aspirin 81 MG EC tablet Take 1 tablet (81 mg total) by mouth daily. 12/07/17  Yes Amin, Jeanella Flattery, MD  atorvastatin (LIPITOR) 80 MG tablet Take 1 tablet (80 mg total) by mouth at bedtime. 12/07/17  Yes Amin, Jeanella Flattery, MD  b complex vitamins tablet Take 1 tablet by mouth daily. 12/07/17 01/06/18 Yes Amin, Jeanella Flattery, MD  clopidogrel (PLAVIX) 75 MG tablet Take 1 tablet (75 mg total) by mouth daily. 12/07/17 01/06/18 Yes Amin, Jeanella Flattery, MD  FLUoxetine (PROZAC) 10 MG capsule Take 1 capsule (10 mg total) by mouth daily. 12/07/17 01/06/18 Yes Amin, Jeanella Flattery, MD  FLUoxetine (PROZAC) 40 MG capsule Take 1 capsule (40 mg total) by mouth daily. 12/07/17 01/06/18 Yes Amin, Jeanella Flattery, MD  insulin aspart (NOVOLOG) 100 UNIT/ML injection 35 units per insulin pump daily 10/31/17  Yes Renato Shin, MD  Insulin Infusion Pump Supplies (MINIMED INFUSION SET-MMT 399) MISC See admin instructions. Use with Humulin insulin - continuous - basal rate 0.625   Yes [provider]  levothyroxine (SYNTHROID) 100 MCG tablet Take 1 tablet (100 mcg total) by mouth daily before breakfast. 12/07/17 01/06/18 Yes Amin, Ankit Chirag, MD  metoprolol succinate (TOPROL XL) 25 MG 24 hr tablet Take 0.5 tablets (12.5 mg total) by mouth daily. 12/07/17 01/06/18 Yes Amin, Ankit Chirag, MD  montelukast (SINGULAIR) 10 MG tablet Take 1 tablet (10 mg total) by mouth at bedtime.  12/07/17 01/06/18 Yes Amin, Jeanella Flattery, MD  nitroGLYCERIN (NITROSTAT) 0.4 MG SL tablet Place 1 tablet (0.4 mg total) under the tongue every 5 (five) minutes as needed for chest pain. 12/07/17  Yes Amin, Ankit Chirag, MD  pantoprazole (PROTONIX) 40 MG tablet Take 1 tablet (40 mg total) by mouth daily. 12/07/17  Yes Amin, Ankit Chirag, MD  Continuous Glucose Monitor Sup (ENLITE GLUCOSE SENSOR) MISC 1 Device by Does not apply route once a week. 10/08/14   Renato Shin, MD  glucose blood (BAYER CONTOUR NEXT TEST) test strip Test blood glucose 2-3 times a day. Dx code: 250.01 08/23/12  Renato Shin, MD  HYDROcodone-acetaminophen [facility]) 5-325 MG tablet Take 1 tablet by mouth every 4 (four) hours as needed (for pain). 12/07/17   Damita Lack, MD    Physical Exam: Vitals:   12/31/17 1400 12/31/17 1430 12/31/17 1445 12/31/17 1650  BP: (!) 176/93 (!) 167/92 (!) 160/84 (!) 87/57  Pulse: (!) 121 (!) 122 (!) 113 90  Resp: 20 14 14 18   Temp:    98.5 F (36.9 C)  SpO2: 99% 100% 100% 100%  Weight:      Height:         General: Appears calm but uncomfortable and is NAD, mildly confused Eyes:  PERRL, EOMI, normal lids, iris ENT:  grossly normal hearing, lips & tongue, dry mm Neck:  no LAD, masses or thyromegaly; no carotid bruits Cardiovascular:  RR with tachycardia, no m/r/g. No LE edema.  Respiratory:   CTA bilaterally with no wheezes/rales/rhonchi.  Normal respiratory effort. Abdomen:  soft, NT, ND, NABS Back:   normal alignment, no CVAT Skin:  no rash or induration seen on limited exam Musculoskeletal:  grossly normal tone BUE/BLE, good ROM, no bony abnormality Lower extremity:  No LE edema.  Limited foot exam with no ulcerations.  2+ distal pulses. Psychiatric:  grossly normal mood and affect, speech fluent but mildly inappropriate, AOx3 Neurologic:  CN 2-12 grossly intact, moves all extremities in coordinated fashion, sensation intact    Radiological Exams on Admission: No results  found.  EKG: Independently reviewed.  Sinus tachycardia with rate 122; nonspecific ST changes with no evidence of acute ischemia - likely rate related   Labs on Admission: I have personally reviewed the available labs and imaging studies at the time of the admission.  Pertinent labs:   VBG: 7.298/32.9/17 WBC 14.3 Platelets 522 UA: >500 glucose; >80 ketones UDS + THC HCG negative  Assessment/Plan Principal Problem:   DKA (diabetic ketoacidosis) ([place]) Active Problems:   Hypothyroidism   Type 1 diabetes mellitus (HCC)   Gastroparesis due to DM ([place])   Marijuana abuse   DKA -Patient with poor baseline control (see below) -At approximate baseline last week but ate something that caused diarrhea followed by recurrent n/v (this is a usual pattern for her, it seems) -No indication of illness as source -Otherwise no apparent reason for DKA -Mild to moderate DKA on admission based on pH 7.289, HCO3 17, patient alert and possibly a bit drowsy -Will admit to SDU with DKA protocol -Would recommend continuing insulin drip at least until morning regardless of rapidity of closure of gap and normalization of labs -K+ < 5 and so potassium supplementation added -IVF at 150 cc/hr, NS until glucose <250 and then decrease rate to 125 and change to D51/2NS -Discontinue insulin pump for now  Type 1 DM -A1c on 11/15 was 9.7, indicating poor control -Resume insulin pump when she transitions off DKA protocol, but she needs better control as an outpatient  Gastroparesis -N/V/D appears most likely related to this issue and prevents her from using her insulin appropriately -Possibly exacerbated by depression -Consider addition of Reglan  Hypothyroidism -Check TSH and free T4 -Continue Synthroid at current dose for now  Marijuana abuse -Cessation encouraged; this should be encouraged on an ongoing basis -UDS ordered and positive only for [facility]   DVT prophylaxis:  Lovenox  Code Status:  Full -  confirmed with patient Family Communication: None present Disposition Plan:  Home once clinically improved Consults called: None  Admission status: It is my clinical opinion that referral  for OBSERVATION is reasonable and necessary in this patient based on the above information provided. The aforementioned taken together are felt to place the patient at high risk for further clinical deterioration. However it is anticipated that the patient may be medically stable for discharge from the hospital within 24 to 48 hours.    Karmen Bongo MD [facility]  If note is complete, please contact covering daytime or nighttime physician. [email or web address] Password [email or web address]  12/31/2017, 4:59 PM

## 2017-12-31 NOTE — Progress Notes (Signed)
Inpatient Diabetes Program Recommendations  AACE/ADA: New Consensus Statement on Inpatient Glycemic Control (2015)  Target Ranges:  Prepandial:   less than 140 mg/dL      Peak postprandial:   less than 180 mg/dL (1-2 hours)      Critically ill patients:  140 - 180 mg/dL   Lab Results  Component Value Date   GLUCAP 323 (H) 12/31/2017   HGBA1C 9.7 (H) 12/07/2017    Review of Glycemic ControlResults for Jennifer Hogan, Jennifer Hogan (MRN [id number]) as of 12/31/2017 14:28  Ref. Range 12/31/2017 10:18  Glucose-Capillary Latest Ref Range: 70 - 99 mg/dL 323 (H)    Diabetes history: Type 1 DM  Outpatient Diabetes medications:  Insulin pump:  Per Endocrinology notes from last visit on 06/13/17: Basal rate of 0.65 units/hr, total of 15.6 units in a 24 hour period.  Mealtime bolus of 1 unit for every 18 grams of carbohydrate Correction bolus of 1 unit for each 60 >100 mg/dL  Current orders for Inpatient glycemic control:  Insulin pump removed and IV insulin started/DKA order set  Inpatient Diabetes Program Recommendations:    Note that patient being treated for DKA and insulin pump removed.  Once acidosis resolved, patient should be able to resume insulin pump.  Spoke with RN and patient by phone.  Reminded patient that insulin pump will need to be removed while on the insulin drip.  Also discussed that when insulin pump is able to be restarted she will need to start "new site" and will need her insulin pump supplies.  Patient states she does not currently have her supplies but will ask her family to bring it.   Thanks,  Adah Perl, RN, BC-ADM Inpatient Diabetes Coordinator Pager [phone] (8a-5p)

## 2017-12-31 NOTE — ED Notes (Signed)
During triage assessment, patient stated she had thoughts over the last few days of dying and wishing to be dead. Denies current plan. Will notify MD.

## 2017-12-31 NOTE — ED Notes (Signed)
Spoke with the diabetes coordinator with patient via speaker phone. Patient to stop her insulin pump once the insulin drip is started. Once she starts her insulin drip again have to use a new site. Patient verbalized understanding.

## 2017-12-31 NOTE — ED Provider Notes (Signed)
[place] EMERGENCY DEPARTMENT Provider Note   CSN: [id number] Arrival date & time: 12/31/17  1007     History   Chief Complaint Chief Complaint  Patient presents with  . Hyperglycemia  . Emesis    HPI CASEE Jennifer Hogan is a 47 y.o. female with PMH TIDM, recent hospital admission for gastroparesis presenting with two days of nausea, vomiting, diarrhea, fever, and sweats. She states her symptoms seemed to come out of nowhere. She has abdominal cramping as well and has not urinated in 24 hours, which is not usually an issue for her. Denies dysuria, painful urination or other acute changes. Per patient, symptoms preceded her hyperglycemia, and she has had difficulty controlling her glucose with these symptoms and sugars have been ~350. She gave herself an insulin bolus earlier this morning but is unsure how much she injected.  She also endorses feelings of depression and wishing everything that has been going on would end. She states she does not want to hurt herself but is tired of having to keep returning to the hospital and having to take care of her father without support at home and "wants all the difficulty to end."    The history is provided by the patient.  Hyperglycemia  Blood sugar level PTA:  250 Onset quality:  Unable to specify Chronicity:  Recurrent Diabetes status:  Controlled with insulin Context: recent illness   Associated symptoms: abdominal pain, dehydration, diaphoresis, fatigue, fever, nausea, vomiting and weakness   Associated symptoms: no chest pain, no confusion and no shortness of breath   Emesis   Associated symptoms include abdominal pain, chills, diarrhea and a fever. Pertinent negatives include no cough.    Past Medical History:  Diagnosis Date  . Acute sinusitis 08/07/2015  . Allergic state 08/07/2015  . Anxiety   . Depression   . Diabetes mellitus   . High cholesterol   . Hypothyroidism   . NSTEMI (non-ST elevated myocardial  infarction) ([place]) 03/17/2017   s/p DES x 3 LAD, DES OM    Patient Active Problem List   Diagnosis Date Noted  . Gastroparesis 12/06/2017  . Gastroparesis due to DM ([place]) 03/28/2017  . Coronary artery disease 03/28/2017  . Ischemic cardiomyopathy 03/28/2017  . Pure hypercholesterolemia   . Anemia 03/17/2017  . Allergic state 08/07/2015  . Vitiligo 07/09/2012  . Type 1 diabetes mellitus ([place]) 07/09/2012  . Depression 12/04/2010  . Hypothyroidism 12/04/2010    Past Surgical History:  Procedure Laterality Date  . APPENDECTOMY    . CORONARY STENT INTERVENTION N/A 03/20/2017   Procedure: DES x 3 LAD, DES OM; Surgeon: Burnell Blanks, MD;  Location: [place] [facility];  Service: Cardiovascular;  Laterality: N/A;  . LEFT HEART CATH AND CORONARY ANGIOGRAPHY N/A 03/20/2017   Procedure: LEFT HEART CATH AND CORONARY ANGIOGRAPHY;  Surgeon: Burnell Blanks, MD;  Location: [place] [facility];  Service: Cardiovascular;  Laterality: N/A;     OB History    Gravida  0   Para  0   Term  0   Preterm  0   AB  0   Living  0     SAB  0   TAB  0   Ectopic  0   Multiple  0   Live Births  0            Home Medications    Prior to Admission medications   Medication Sig Start Date End Date Taking? Authorizing Provider  albuterol (PROVENTIL HFA;VENTOLIN HFA) 108 (90 Base) MCG/ACT inhaler Inhale 2 puffs into the lungs every 6 (six) hours as needed for wheezing or shortness of breath. 12/07/17 01/06/18 Yes Amin, Jeanella Flattery, MD  aspirin 81 MG EC tablet Take 1 tablet (81 mg total) by mouth daily. 12/07/17  Yes Amin, Jeanella Flattery, MD  atorvastatin (LIPITOR) 80 MG tablet Take 1 tablet (80 mg total) by mouth at bedtime. 12/07/17  Yes Amin, Jeanella Flattery, MD  b complex vitamins tablet Take 1 tablet by mouth daily. 12/07/17 01/06/18 Yes Amin, Jeanella Flattery, MD  clopidogrel (PLAVIX) 75 MG tablet Take 1 tablet (75 mg total) by mouth daily. 12/07/17 01/06/18 Yes Amin, Jeanella Flattery, MD  FLUoxetine (PROZAC) 10 MG capsule Take 1 capsule (10 mg total) by mouth daily. 12/07/17 01/06/18 Yes Amin, Jeanella Flattery, MD  FLUoxetine (PROZAC) 40 MG capsule Take 1 capsule (40 mg total) by mouth daily. 12/07/17 01/06/18 Yes Amin, Jeanella Flattery, MD  insulin aspart (NOVOLOG) 100 UNIT/ML injection 35 units per insulin pump daily 10/31/17  Yes Renato Shin, MD  Insulin Infusion Pump Supplies (MINIMED INFUSION SET-MMT 399) MISC See admin instructions. Use with Humulin insulin - continuous - basal rate 0.625   Yes [provider]  levothyroxine (SYNTHROID) 100 MCG tablet Take 1 tablet (100 mcg total) by mouth daily before breakfast. 12/07/17 01/06/18 Yes Amin, Ankit Chirag, MD  metoprolol succinate (TOPROL XL) 25 MG 24 hr tablet Take 0.5 tablets (12.5 mg total) by mouth daily. 12/07/17 01/06/18 Yes Amin, Ankit Chirag, MD  montelukast (SINGULAIR) 10 MG tablet Take 1 tablet (10 mg total) by mouth at bedtime. 12/07/17 01/06/18 Yes Amin, Jeanella Flattery, MD  nitroGLYCERIN (NITROSTAT) 0.4 MG SL tablet Place 1 tablet (0.4 mg total) under the tongue every 5 (five) minutes as needed for chest pain. 12/07/17  Yes Amin, Ankit Chirag, MD  pantoprazole (PROTONIX) 40 MG tablet Take 1 tablet (40 mg total) by mouth daily. 12/07/17  Yes Amin, Ankit Chirag, MD  Continuous Glucose Monitor Sup (ENLITE GLUCOSE SENSOR) MISC 1 Device by Does not apply route once a week. 10/08/14   Renato Shin, MD  glucose blood (BAYER CONTOUR NEXT TEST) test strip Test blood glucose 2-3 times a day. Dx code: 250.01 08/23/12   Renato Shin, MD  HYDROcodone-acetaminophen [facility]) 5-325 MG tablet Take 1 tablet by mouth every 4 (four) hours as needed (for pain). 12/07/17   Damita Lack, MD    Family History Family History  Problem Relation Age of Onset  . Coronary artery disease Father   . Congestive Heart Failure Father   . Asthma Father   . Cancer Mother        T cell lymphoma    Social History Social History    Tobacco Use  . Smoking status: Never Smoker  . Smokeless tobacco: Never Used  . Tobacco comment: Use marijuana  Substance Use Topics  . Alcohol use: No    Alcohol/week: 0.0 standard drinks  . Drug use: Yes    Types: Marijuana    Comment: occ     Allergies   Ciprofloxin hcl [ciprofloxacin] and Food color red [red dye]   Review of Systems Review of Systems  Constitutional: Positive for appetite change, chills, diaphoresis, fatigue and fever.  Respiratory: Negative for cough, chest tightness and shortness of breath.   Cardiovascular: Negative for chest pain.  Gastrointestinal: Positive for abdominal pain, diarrhea, nausea and vomiting. Negative for blood in stool.  Genitourinary: Positive for decreased urine volume and difficulty urinating.  Negative for flank pain.  Neurological: Positive for weakness.  Psychiatric/Behavioral: Positive for suicidal ideas. Negative for confusion.   Ten systems reviewed and are negative for acute change, except as noted in the HPI.    Physical Exam Updated Vital Signs BP (!) 164/91   Pulse (!) 115   Temp 98.7 F (37.1 C)   Resp 20   Ht 5\' 5"  (1.651 m)   Wt 61.2 kg   LMP 12/20/2017   SpO2 100%   BMI 22.47 kg/m   Physical Exam  Constitutional: She appears well-developed and well-nourished. She appears ill. She appears distressed.  HENT:  Head: Normocephalic and atraumatic.  Eyes: Conjunctivae and EOM are normal.  Cardiovascular: Regular rhythm and normal heart sounds. Tachycardia present.  Pulmonary/Chest: Effort normal.  Abdominal: Normal appearance. There is generalized tenderness.  Skin: Skin is warm and dry. There is pallor.  Psychiatric: Her speech is normal. Her mood appears anxious. She is agitated. She exhibits a depressed mood.     ED Treatments / Results  Labs (all labs ordered are listed, but only abnormal results are displayed) Labs Reviewed  URINALYSIS, ROUTINE W REFLEX MICROSCOPIC - Abnormal; Notable for the  following components:      Result Value   Glucose, UA >=500 (*)    Bilirubin Urine SMALL (*)    Ketones, ur >80 (*)    All other components within normal limits  CBC WITH DIFFERENTIAL/PLATELET - Abnormal; Notable for the following components:   WBC 14.3 (*)    Platelets 522 (*)    Neutro Abs 12.4 (*)    All other components within normal limits  ACETAMINOPHEN LEVEL - Abnormal; Notable for the following components:   Acetaminophen (Tylenol), Serum <10 (*)    All other components within normal limits  RAPID URINE DRUG SCREEN, HOSP PERFORMED - Abnormal; Notable for the following components:   Tetrahydrocannabinol POSITIVE (*)    All other components within normal limits  URINALYSIS, MICROSCOPIC (REFLEX) - Abnormal; Notable for the following components:   Bacteria, UA RARE (*)    All other components within normal limits  CBG MONITORING, ED - Abnormal; Notable for the following components:   Glucose-Capillary 323 (*)    All other components within normal limits  I-STAT CHEM 8, ED - Abnormal; Notable for the following components:   BUN 28 (*)    Glucose, Bld 325 (*)    TCO2 19 (*)    All other components within normal limits  I-STAT VENOUS BLOOD GAS, ED - Abnormal; Notable for the following components:   pCO2, Ven 32.9 (*)    pO2, Ven 24.0 (*)    Bicarbonate 16.1 (*)    TCO2 17 (*)    Acid-base deficit 9.0 (*)    All other components within normal limits  URINE CULTURE  MAGNESIUM  I-STAT BETA HCG BLOOD, ED ([facility], [facility], [facility] ONLY)    EKG EKG Interpretation  Date/Time:  Monday December 31 2017 10:21:29 EST Ventricular Rate:  122 PR Interval:    QRS Duration: 78 QT Interval:  314 QTC Calculation: 448 R Axis:   77 Text Interpretation:  Sinus tachycardia Consider right atrial enlargement Probable anterior infarct, old Minimal ST depression, diffuse leads Since last tracing rate faster Confirmed by Isla Pence [phone]) on 12/31/2017 10:26:04 AM   Radiology No results  found.  Procedures Procedures (including critical care time)  Medications Ordered in ED Medications  sodium chloride 0.9 % bolus 1,000 mL (0 mLs Intravenous Stopped 12/31/17 1155)  And  0.9 %  sodium chloride infusion ( Intravenous New Bag/Given 12/31/17 1157)  insulin regular, human (MYXREDLIN) 100 units/ 100 mL infusion (has no administration in time range)  ondansetron (ZOFRAN) injection 4 mg (4 mg Intravenous Given 12/31/17 1055)  HYDROmorphone (DILAUDID) injection 0.5 mg (0.5 mg Intravenous Given 12/31/17 1153)     Initial Impression / Assessment and Plan / ED Course  I have reviewed the triage vital signs and the nursing notes.  Pertinent labs & imaging results that were available during my care of the patient were reviewed by me and considered in my medical decision making (see chart for details).  Clinical Course as of Jan 01 1232  Mon Dec 31, 2017  1116 Bladder scan with in & out cath relieved >1050cc urine. UA and culture ordered, DKA protocol but will hold off insulin drip as unclear how much of an insulin bolus she took earlier.    [JS]    Clinical Course User Index [JS] ,  A, DO   46yo with history of TIDM, HFrEF presenting with three days of nausea, vomiting, diarrhea, fever and found to be in DKA with anion gapped metabolic acidosis and positive ketones. She additionally appears dehydrated and has been unable to eat. Started on IV insulin and fluids, given medication for nausea and pain. Additionally having difficulty urinating the past 24hrs, in&out relieved 1050 cc urine. Denies dysuria, UA not significant for infection, abx not started.    Recently admitted in November with [facility], will call for admission.  Final Clinical Impressions(s) / ED Diagnoses   Final diagnoses:  Dehydration  Diabetic ketoacidosis without coma associated with type 1 diabetes mellitus (HCC)  Nausea vomiting and diarrhea  Retention, urine    ED Discharge Orders    None        ,  A, DO 12/31/17 1234    Isla Pence, MD 12/31/17 1318

## 2017-12-31 NOTE — ED Triage Notes (Signed)
Pt arrives via EMS from home with nausea/vomiting for the last 2 days. Pt diabetic type 1 with insulin pump. CBG initally 343. Gave herself insulin bolus. Recheck cbg was 241. 18g LAC, 8mg  zofran. 1L NS. Pt alert, oriented x4. No urine produced for 24 hours per patient. C/o abdominal pain. HR 130s. 166/100 bp.

## 2017-12-31 NOTE — ED Notes (Signed)
Pt gave herself 2.2 units of insulin.

## 2018-01-01 DIAGNOSIS — E860 Dehydration: Secondary | ICD-10-CM

## 2018-01-01 LAB — BASIC METABOLIC PANEL
ANION GAP: 8 (ref 5–15)
Anion gap: 10 (ref 5–15)
Anion gap: 11 (ref 5–15)
BUN: 14 mg/dL (ref 6–20)
BUN: 12 mg/dL (ref 6–20)
BUN: 11 mg/dL (ref 6–20)
CO2: 22 mmol/L (ref 22–32)
CO2: 20 mmol/L — ABNORMAL LOW (ref 22–32)
CO2: 21 mmol/L — ABNORMAL LOW (ref 22–32)
Calcium: 8.1 mg/dL — ABNORMAL LOW (ref 8.9–10.3)
Calcium: 8.3 mg/dL — ABNORMAL LOW (ref 8.9–10.3)
Calcium: 8.2 mg/dL — ABNORMAL LOW (ref 8.9–10.3)
Chloride: 105 mmol/L (ref 98–111)
Chloride: 108 mmol/L (ref 98–111)
Chloride: 106 mmol/L (ref 98–111)
Creatinine, Ser: 0.92 mg/dL (ref 0.44–1.00)
Creatinine, Ser: 0.76 mg/dL (ref 0.44–1.00)
Creatinine, Ser: 0.85 mg/dL (ref 0.44–1.00)
GFR calc Af Amer: >60 mL/min (ref >60)
GFR calc Af Amer: >60 mL/min (ref >60)
GFR calc Af Amer: >60 mL/min (ref >60)
GFR calc non Af Amer: >60 mL/min (ref >60)
GFR calc non Af Amer: >60 mL/min (ref >60)
GLUCOSE: 207 mg/dL — AB (ref 70–99)
Glucose, Bld: 274 mg/dL — ABNORMAL HIGH (ref 70–99)
Glucose, Bld: 91 mg/dL (ref 70–99)
Potassium: 3.8 mmol/L (ref 3.5–5.1)
Potassium: 3.5 mmol/L (ref 3.5–5.1)
Potassium: 3.9 mmol/L (ref 3.5–5.1)
SODIUM: 138 mmol/L (ref 135–145)
Sodium: 135 mmol/L (ref 135–145)
Sodium: 138 mmol/L (ref 135–145)

## 2018-01-01 LAB — GLUCOSE, CAPILLARY
GLUCOSE-CAPILLARY: 112 mg/dL — AB (ref 70–99)
GLUCOSE-CAPILLARY: 125 mg/dL — AB (ref 70–99)
GLUCOSE-CAPILLARY: 146 mg/dL — AB (ref 70–99)
Glucose-Capillary: 108 mg/dL — ABNORMAL HIGH (ref 70–99)
Glucose-Capillary: 109 mg/dL — ABNORMAL HIGH (ref 70–99)
Glucose-Capillary: 114 mg/dL — ABNORMAL HIGH (ref 70–99)
Glucose-Capillary: 118 mg/dL — ABNORMAL HIGH (ref 70–99)
Glucose-Capillary: 152 mg/dL — ABNORMAL HIGH (ref 70–99)
Glucose-Capillary: 159 mg/dL — ABNORMAL HIGH (ref 70–99)
Glucose-Capillary: 162 mg/dL — ABNORMAL HIGH (ref 70–99)
Glucose-Capillary: 174 mg/dL — ABNORMAL HIGH (ref 70–99)
Glucose-Capillary: 176 mg/dL — ABNORMAL HIGH (ref 70–99)
Glucose-Capillary: 190 mg/dL — ABNORMAL HIGH (ref 70–99)
Glucose-Capillary: 190 mg/dL — ABNORMAL HIGH (ref 70–99)
Glucose-Capillary: 214 mg/dL — ABNORMAL HIGH (ref 70–99)
Glucose-Capillary: 236 mg/dL — ABNORMAL HIGH (ref 70–99)
Glucose-Capillary: 245 mg/dL — ABNORMAL HIGH (ref 70–99)
Glucose-Capillary: 93 mg/dL (ref 70–99)

## 2018-01-01 LAB — URINE CULTURE: Culture: NO GROWTH

## 2018-01-01 MED ORDER — INSULIN PUMP
SUBCUTANEOUS | Status: DC
  Administered 2018-01-01: 0.2 via SUBCUTANEOUS
  Administered 2018-01-01: 2 via SUBCUTANEOUS
  Administered 2018-01-01: 14:00:00 via SUBCUTANEOUS
  Filled 2018-01-01: qty 1

## 2018-01-01 MED ORDER — INSULIN ASPART 100 UNIT/ML ~~LOC~~ SOLN
300.0000 [IU] | Freq: Once | SUBCUTANEOUS | Status: AC
  Administered 2018-01-01: 300 [IU] via SUBCUTANEOUS

## 2018-01-01 MED ORDER — ALBUTEROL SULFATE (2.5 MG/3ML) 0.083% IN NEBU: 3.0000 mL | INHALATION_SOLUTION | RESPIRATORY_TRACT | Status: DC | PRN

## 2018-01-01 MED ORDER — ZOLPIDEM TARTRATE 5 MG PO TABS
5.0000 mg | ORAL_TABLET | Freq: Once | ORAL | Status: AC
  Administered 2018-01-01: 5 mg via ORAL
  Filled 2018-01-01: qty 1

## 2018-01-01 MED ORDER — ACETAMINOPHEN 325 MG PO TABS
650.0000 mg | ORAL_TABLET | Freq: Four times a day (QID) | ORAL | Status: DC | PRN
  Administered 2018-01-01: 650 mg via ORAL
  Filled 2018-01-01: qty 2

## 2018-01-01 MED FILL — Insulin Aspart Inj 100 Unit/ML: SUBCUTANEOUS | Qty: 10 | Status: AC

## 2018-01-01 NOTE — Progress Notes (Addendum)
Inpatient Diabetes Program Recommendations  AACE/ADA: New Consensus Statement on Inpatient Glycemic Control (2015)  Target Ranges:  Prepandial:   less than 140 mg/dL      Peak postprandial:   less than 180 mg/dL (1-2 hours)      Critically ill patients:  140 - 180 mg/dL   Lab Results  Component Value Date   GLUCAP 146 (H) 01/01/2018   HGBA1C 9.7 (H) 12/07/2017     Results for GLAYDS, [employer] (MRN [id number]) as of 01/01/2018 14:53  Ref. Range 01/01/2018 12:19 01/01/2018 13:16 01/01/2018 14:14  Glucose-Capillary Latest Ref Range: 70 - 99 mg/dL 125 (H) 159 (H) 146 (H)    Diabetes history: Type 1 DM   Order placed for Insulin drip to be stopped and patient to resume insulin pump.   Insulin pump:  Per Endocrinology notes from last visit on 06/13/17 and verified with patient today:  Basal insulin 0.65 units/hour Total daily basal insulin: 15.6 units/24 hours  Carb Coverage 1:18 1 unit for every 18 grams of carbohydrates  Insulin Sensitivity (or correction bolus) 1:60 1 unit for each 60 by which glucose exceeds 100 mg/dl  Spoke with patient regarding diabetes and home regimen for diabetes management.  Patient states that she was diagnosed with diabetes at the age of 47 years old and she is followed by Dr. Loanne Drilling for diabetes management. Patient uses a Medtronic insulin pump with Humalog insulin as an outpatient.   Diabetes coordinators met with patient and observed her putting her pump back on at 1415.  Patient used new supplies and reservoir is filled with Novolog insulin and pump inserted to a new site. Bedside RN Margarita Grizzle aware to stop insulin drip at 1515 (one hour after pump started per order). Patient was very happy to have her pump back. Insulin pump contract signed and placed in chart.   NURSING: Once insulin pump order set is ordered please print off the Patient insulin pump contract and flow sheet. The insulin pump contract should be signed by the patient and then placed  in the chart. The patient insulin pump flow sheet will be completed by the patient at the bedside and the RN caring for the patient will use the patient's flow sheet to document in the [facility]. RN will need to complete the Nursing Insulin Pump Flowsheet at least once a shift. Patient will need to keep extra insulin pump supplies at the bedside at all times.   Thank you.  -- Will follow during hospitalization.--  Jonna Clark RN, MSN Diabetes Coordinator Inpatient Glycemic Control Team Team Pager: [phone] (8am-5pm)

## 2018-01-01 NOTE — Progress Notes (Signed)
[facility] - Stepdown/ICU TEAM  Jennifer Hogan  OAC:[id number] DOB: 12/09/1970 DOA: 12/31/2017 PCP: Jennifer Pounds, NP    Brief Narrative:  47 y.o. female with a hx of CAD s/p DES in 2/19; hypothyroidism; HLD; and DM who developed recurrent n/v/d after eating a salad from a fast food restaurant. Her pump got accidentally yanked out of her skin the following day, and she was using shots manually, despite which her CBG persisted in the 400s.  Significant Events: 12/9 admit   Subjective: The patient has been transitioned back to her insulin pump and the insulin drip discontinued.  She has thus far tolerated this without major difficulty.  She denies chest pain nausea vomiting or abdominal pain at this time.  She reports a slowly improving appetite but has yet to consume anything beyond a very small volume of clear liquids.  She remains quite weak in general.  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  rate 17, height 5\' 5"  (Hogan.651 m), weight 61.2 kg, last menstrual period 12/20/2017, SpO2 96 %.  Intake/Output Summary (Last 24 hours) at 01/01/2018 1557 Last data filed at 01/01/2018 1500 Gross per 24 hour  Intake 2825.49 ml  Output -  Net 2825.49 ml   Filed Weights   12/31/17 1014  Weight: 61.2 kg    Examination: General: No acute respiratory distress Lungs: Clear to auscultation bilaterally without wheezes or crackles Cardiovascular: Regular rate and rhythm without murmur gallop or rub normal S1 and S2 Abdomen: Nontender, nondistended, soft, bowel sounds positive, no rebound, no ascites, no appreciable mass Extremities: No significant cyanosis, clubbing, or edema bilateral lower extremities  CBC: Recent Labs  Lab 12/31/17 1048 12/31/17 1132  WBC 14.3*  --   NEUTROABS 12.4*  --   HGB 12.7 13.9  HCT 39.0 41.0  MCV 91.5  --   PLT 522*  --    Basic Metabolic Panel: Recent Labs  Lab 12/31/17 1048  01/01/18 0006 01/01/18 0359 01/01/18 0714  NA  --    < > 135 138 138  K  --    < > 3.8 3.5 3.9  CL  --    < > 105 108 106  CO2  --    < > 22 20* 21*  GLUCOSE  --    < > 274* 91 207*  BUN  --    < > 14 12 11   CREATININE  --    < > 0.92 0.76  0.85  CALCIUM  --    < > 8.Hogan* 8.3* 8.2*  MG 2.3  --   --   --   --    < > = values in this interval not displayed.   GFR: Estimated Creatinine Clearance: 72.8 mL/min (by C-G formula based on SCr of 0.85 mg/dL).  Liver Function Tests: No results for input(s): AST, ALT, ALKPHOS, BILITOT, PROT, ALBUMIN in the last 168 hours. No results for input(s): LIPASE, AMYLASE in the last 168 hours. No results for input(s): AMMONIA in the last 168 hours.  Coagulation Profile: No results for input(s): INR, PROTIME in the last 168 hours.  Cardiac Enzymes: No results for input(s): CKTOTAL, CKMB, CKMBINDEX, TROPONINI in the last 168 hours.  HbA1C: Hemoglobin A1C  Date/Time Value Ref Range Status  06/13/2017 08:33 AM 10.8 (A) 4.0 - 5.6 % Final    03/28/2017 11:01 AM 7.9  Final   Hgb A1c MFr Bld  Date/Time Value Ref Range Status  12/07/2017 06:01 AM 9.7 (H) 4.8 - 5.6 % Final    Comment:    (NOTE) Pre diabetes:          5.7%-6.4% Diabetes:              >6.4% Glycemic control for   <7.0% adults with diabetes   10/08/2014 08:27 AM 8.6 (H) 4.6 - 6.5 % Final    Comment:    Glycemic Control Guidelines for People with Diabetes:Non Diabetic:  <6%Goal of Therapy: <7%Additional Action Suggested:  >8%     CBG: Recent Labs  Lab 01/01/18 1110 01/01/18 1219 01/01/18 1316 01/01/18 1414 01/01/18 1528  GLUCAP 112* 125* 159* 146* 190*    Recent Results (from the past 240 hour(s))  Urine culture     Status: None   Collection Time: 12/31/17 11:31 AM  Result Value Ref Range Status   Specimen Description URINE, RANDOM  Final   Special Requests NONE  Final   Culture   Final    NO GROWTH Performed at [facility], [place] [address], [place], [place] [address]    Report Status 01/01/2018 FINAL  Final  MRSA PCR Screening     Status: None   Collection Time: 12/31/17  4:42 PM  Result Value Ref Range Status   MRSA by PCR NEGATIVE NEGATIVE Final    Comment:        The GeneXpert MRSA Assay (FDA approved for NASAL specimens only), is one component of a comprehensive MRSA colonization surveillance program. It is not intended to diagnose MRSA infection nor to guide or monitor treatment for MRSA infections. Performed at [facility], [place] [address] [place], [place] [address]      Scheduled Meds: . aspirin EC  81 mg Oral Daily  . atorvastatin  80 mg Oral QHS  . clopidogrel  75 mg Oral Daily  . enoxaparin (LOVENOX) injection  40 mg Subcutaneous Q24H  . FLUoxetine  10 mg Oral Daily  . FLUoxetine  40 mg Oral Daily  . insulin pump   Subcutaneous Q4H  . levothyroxine  100 mcg Oral QAC breakfast  . metoprolol succinate  12.5 mg Oral Daily  . montelukast  10 mg Oral QHS  . pantoprazole  40 mg Oral Daily   Continuous  Infusions: . dextrose 5 % and 0.45% NaCl Stopped (01/01/18 1520)  . insulin Stopped (01/01/18 1520)     LOS: 0 days   Cherene Altes, MD [facility] Office  [phone] Pager - Text Page  per Amion  If 7PM-7AM, please contact night-coverage per Amion 01/01/2018, 3:57 PM

## 2018-01-02 LAB — COMPREHENSIVE METABOLIC PANEL
ALT: 30 U/L (ref 0–44)
AST: 40 U/L (ref 15–41)
Albumin: 2.9 g/dL — ABNORMAL LOW (ref 3.5–5.0)
Alkaline Phosphatase: 57 U/L (ref 38–126)
Anion gap: 7 (ref 5–15)
BUN: 6 mg/dL (ref 6–20)
CO2: 24 mmol/L (ref 22–32)
Calcium: 8.8 mg/dL — ABNORMAL LOW (ref 8.9–10.3)
Chloride: 109 mmol/L (ref 98–111)
Creatinine, Ser: 0.8 mg/dL (ref 0.44–1.00)
GFR calc Af Amer: >60 mL/min (ref >60)
GFR calc non Af Amer: >60 mL/min (ref >60)
Glucose, Bld: 109 mg/dL — ABNORMAL HIGH (ref 70–99)
Potassium: 4.2 mmol/L (ref 3.5–5.1)
SODIUM: 140 mmol/L (ref 135–145)
Total Bilirubin: 1 mg/dL (ref 0.3–1.2)
Total Protein: 6.3 g/dL — ABNORMAL LOW (ref 6.5–8.1)

## 2018-01-02 LAB — GLUCOSE, CAPILLARY
Glucose-Capillary: 51 mg/dL — ABNORMAL LOW (ref 70–99)
Glucose-Capillary: 78 mg/dL (ref 70–99)
Glucose-Capillary: 101 mg/dL — ABNORMAL HIGH (ref 70–99)
Glucose-Capillary: 69 mg/dL — ABNORMAL LOW (ref 70–99)
Glucose-Capillary: 94 mg/dL (ref 70–99)

## 2018-01-02 MED ORDER — ONDANSETRON HCL 4 MG PO TABS: 4.0000 mg | ORAL_TABLET | Freq: Three times a day (TID) | ORAL | 0 refills | Status: DC | PRN

## 2018-01-02 NOTE — Discharge Summary (Addendum)
PATIENT DETAILS Name: Jennifer Hogan Age: 47 y.o. Sex: female Date of Birth: 1970/08/02 MRN: [id number]. Admitting Physician: Karmen Bongo, MD [id number], Vernia Buff, NP  Admit Date: 12/31/2017 Discharge date: 01/02/2018  Recommendations for Outpatient Follow-up:  1. Follow up with PCP in 1-2 weeks 2. Please obtain BMP/CBC in one week 3. Please ensure follow-up with endocrinology  Admitted From:  Home  Disposition: [place]: No  Equipment/Devices: None  Discharge Condition: Stable  CODE STATUS: FULL CODE  Diet recommendation:  Heart Healthy / Carb Modified  Brief Summary: See H&P, Labs, Consult and Test reports for all details in brief, patient history of DM-1 on insulin pump, CAD status post PCI on dual antiplatelets-presented with nausea/vomiting and diarrhea after eating salad at a fast food restaurant-she was thought to have DKA and admitted to the hospitalist service.  Brief Hospital Course: DKA: Resolved-back on insulin pump.  Thought to be due to recent GI illness and accidental disruption of the insulin pump at the injection site.  DM-1: CBGs now stable on insulin pump.  Patient instructed to follow with endocrinologist in 1 week.  November was 9.7.  CAD: No anginal symptoms-continue dual antiplatelet agents, beta-blocker and statin-follow with primary cardiologist as previously scheduled.  Hypothyroidism: Continue Synthroid-TSH was within normal limits  Procedures/Studies: None  Discharge Diagnoses:  Principal Problem:   DKA (diabetic ketoacidosis) ([place]) Active Problems:   Hypothyroidism   Type 1 diabetes mellitus ([place])   Gastroparesis due to DM ([place])   Marijuana abuse   Discharge Instructions:  Activity:  As tolerated   Discharge Instructions    Diet - low sodium heart healthy   Complete by:  As directed    Diet Carb Modified   Complete by:  As directed    Discharge instructions   Complete by:  As directed    Follow  with Primary MD  [name], NP in 1 week  Follow with Dr Loanne Drilling in 1 week  Please get a complete blood count and chemistry panel checked by your Primary MD at your next visit, and again as instructed by your Primary MD.  Get Medicines reviewed and adjusted: Please take all your medications with you for your next visit with your Primary MD  Laboratory/radiological data: Please request your Primary MD to go over all hospital tests and procedure/radiological results at the follow up, please ask your Primary MD to get all Hospital records sent to his/her office.  In some cases, they will be blood work, cultures and biopsy results pending at the time of your discharge. Please request that your primary care M.D. follows up on these results.  Also Note the following: If you experience worsening of your admission symptoms, develop shortness of breath, life threatening emergency, suicidal or homicidal thoughts you must seek medical attention immediately by calling 911 or calling your MD immediately  if symptoms less severe.  You must read complete instructions/literature along with all the possible adverse reactions/side effects for all the Medicines you take and that have been prescribed to you. Take any new Medicines after you have completely understood and accpet all the possible adverse reactions/side effects.   Do not drive when taking Pain medications or sleeping medications (Benzodaizepines)  Do not take more than prescribed Pain, Sleep and Anxiety Medications. It is not advisable to combine anxiety,sleep and pain medications without talking with your primary care practitioner  Special Instructions: If you have smoked or chewed Tobacco  in the last 2 yrs please stop  smoking, stop any regular Alcohol  and or any Recreational drug use.  Wear Seat belts while driving.  Please note: You were cared for by a hospitalist during your hospital stay. Once you are discharged, your primary care  physician will handle any further medical issues. Please note that NO REFILLS for any discharge medications will be authorized once you are discharged, as it is imperative that you return to your primary care physician (or establish a relationship with a primary care physician if you do not have one) for your post hospital discharge needs so that they can reassess your need for medications and monitor your lab values.   Increase activity slowly   Complete by:  As directed      Allergies as of 01/02/2018      Reactions   Ciprofloxin Hcl [ciprofloxacin] Nausea And Vomiting, Other (See Comments)   Severe migraine   Food Color Red [red Dye] Diarrhea      Medication List    TAKE these medications   albuterol 108 (90 Base) MCG/ACT inhaler Commonly known as:  PROVENTIL HFA;VENTOLIN HFA Inhale 2 puffs into the lungs every 6 (six) hours as needed for wheezing or shortness of breath.   aspirin 81 MG EC tablet Take 1 tablet (81 mg total) by mouth daily.   atorvastatin 80 MG tablet Commonly known as:  LIPITOR Take 1 tablet (80 mg total) by mouth at bedtime.   b complex vitamins tablet Take 1 tablet by mouth daily.   clopidogrel 75 MG tablet Commonly known as:  PLAVIX Take 1 tablet (75 mg total) by mouth daily.   ENLITE GLUCOSE SENSOR Misc 1 Device by Does not apply route once a week.   FLUoxetine 10 MG capsule Commonly known as:  PROZAC Take 1 capsule (10 mg total) by mouth daily.   FLUoxetine 40 MG capsule Commonly known as:  PROZAC Take 1 capsule (40 mg total) by mouth daily.   glucose blood test strip Test blood glucose 2-3 times a day. Dx code: 250.01   HYDROcodone-acetaminophen 5-325 MG tablet Commonly known as:  NORCO/VICODIN Take 1 tablet by mouth every 4 (four) hours as needed (for pain).   insulin aspart 100 UNIT/ML injection Commonly known as:  novoLOG 35 units per insulin pump daily   levothyroxine 100 MCG tablet Commonly known as:  SYNTHROID, LEVOTHROID Take 1  tablet (100 mcg total) by mouth daily before breakfast.   metoprolol succinate 25 MG 24 hr tablet Commonly known as:  TOPROL-XL Take 0.5 tablets (12.5 mg total) by mouth daily.   MINIMED INFUSION SET-MMT 43 Misc See admin instructions. Use with Humulin insulin - continuous - basal rate 0.625   montelukast 10 MG tablet Commonly known as:  SINGULAIR Take 1 tablet (10 mg total) by mouth at bedtime.   nitroGLYCERIN 0.4 MG SL tablet Commonly known as:  NITROSTAT Place 1 tablet (0.4 mg total) under the tongue every 5 (five) minutes as needed for chest pain.   ondansetron 4 MG tablet Commonly known as:  ZOFRAN Take 1 tablet (4 mg total) by mouth every 8 (eight) hours as needed for nausea or vomiting.   pantoprazole 40 MG tablet Commonly known as:  PROTONIX Take 1 tablet (40 mg total) by mouth daily.      Follow-up Information    [name], NP. Schedule an appointment as soon as possible for a visit in 1 week(s).   Specialty:  Nurse Practitioner Contact information: [address] [place] [address] [phone]  [name], MD. Schedule an appointment as soon as possible for a visit in 1 month(s).   Specialty:  Cardiology Contact information: [address] [place] [address] [phone]        Renato Shin, MD. Schedule an appointment as soon as possible for a visit in 1 week(s).   Specialty:  Endocrinology Contact information: [address] [place]  [address] [phone]          Allergies  Allergen Reactions  . Ciprofloxin Hcl [Ciprofloxacin] Nausea And Vomiting and Other (See Comments)    Severe migraine  . Food Color Red [Red Dye] Diarrhea    Consultations:   None   Other Procedures/Studies: Ct Abdomen Pelvis W Contrast  Result Date: 12/04/2017 CLINICAL DATA:  Initial evaluation for acute upper abdominal pain, nausea, vomiting, diarrhea. EXAM: CT ABDOMEN AND PELVIS WITH CONTRAST  TECHNIQUE: Multidetector CT imaging of the abdomen and pelvis was performed using the standard protocol following bolus administration of intravenous contrast. CONTRAST:  141mL ISOVUE-300 IOPAMIDOL (ISOVUE-300) INJECTION 61% COMPARISON:  Prior CT from 12/31/2016. FINDINGS: Lower chest: Minimal linear atelectatic changes at the left lung base. Visualized lungs are otherwise clear. Hepatobiliary: Liver demonstrates a normal contrast enhanced appearance. Gallbladder within normal limits. No biliary dilatation. Pancreas: Pancreas within normal limits. Spleen: Spleen within normal limits. Adrenals/Urinary Tract: Adrenal glands are normal. Kidneys equal size with symmetric enhancement. No nephrolithiasis, hydronephrosis, or focal enhancing renal mass. No appreciable hydroureter. Bladder moderately distended without acute abnormality. Stomach/Bowel: Stomach decompressed without acute abnormality. No evidence for bowel obstruction. Appendix is surgically absent. Colon diffusely decompressed. No acute inflammatory changes about the bowels. Vascular/Lymphatic: Normal intravascular enhancement seen throughout the intra-abdominal aorta. Mild aorto bi-iliac atherosclerotic disease. No aneurysm. Mesenteric vessels patent proximally. No adenopathy. Reproductive: Uterus and ovaries within normal limits. Other: No free air or fluid. Musculoskeletal: No acute osseus abnormality. No lytic or blastic osseous lesions. Moderate degenerative spondylolysis at L3-4 and L4-5. Small benign bone island noted within the right sacral ala. IMPRESSION: 1. No CT evidence for acute intra-abdominal or pelvic process. 2. Prior appendectomy. 3. Mild aorto bi-iliac atherosclerotic disease.  No aneurysm. 4. Moderate degenerative spondylolysis at L3-4 and L4-5. Electronically Signed   By: Jeannine Boga M.D.   On: 12/04/2017 05:49   US Abdomen Limited  Result Date: 12/06/2017 CLINICAL DATA:  Right upper quadrant pain for 5 days EXAM: ULTRASOUND  ABDOMEN LIMITED RIGHT UPPER QUADRANT COMPARISON:  CT abdomen pelvis of 12/04/2017 FINDINGS: Gallbladder: The gallbladder is visualized and no gallstones are noted. There is no pain over the gallbladder with compression. Common bile duct: Diameter: The common bile duct is normal measuring 4.4 mm in diameter. Liver: The parenchyma of the liver is normal in echogenicity. No focal hepatic abnormality is seen. Portal vein is patent on color Doppler imaging with normal direction of blood flow towards the liver. IMPRESSION: Negative limited ultrasound of the right upper quadrant. Electronically Signed   By: Ivar Drape M.D.   On: 12/06/2017 12:14      TODAY-DAY OF DISCHARGE:  Subjective:   Alonnah Welp today has no headache,no chest abdominal pain,no new weakness tingling or numbness, feels much better wants to go home today.   Objective:   Blood pressure 129/84, pulse 68, temperature 98.4 F (36.9 C), temperature source Oral, resp. rate 18, height 5\' 5"  (1.651 m), weight 61.2 kg, last menstrual period 12/20/2017, SpO2 98 %.  Intake/Output Summary (Last 24 hours) at 01/02/2018 0850 Last data filed at  01/01/2018 1500 Gross per 24 hour  Intake 1375.47 ml  Output -  Net 1375.47 ml   Filed Weights   12/31/17 1014  Weight: 61.2 kg    Exam: Awake Alert, Oriented *3, No new F.N deficits, Normal affect .AT,PERRAL Supple Neck,No JVD, No cervical lymphadenopathy appriciated.  Symmetrical Chest wall movement, Good air movement bilaterally, CTAB RRR,No Gallops,Rubs or new Murmurs, No Parasternal Heave +ve B.Sounds, Abd Soft, Non tender, No organomegaly appriciated, No rebound -guarding or rigidity. No Cyanosis, Clubbing or edema, No new Rash or bruise   PERTINENT RADIOLOGIC STUDIES: Ct Abdomen Pelvis W Contrast  Result Date: 12/04/2017 CLINICAL DATA:  Initial evaluation for acute upper abdominal pain, nausea, vomiting, diarrhea. EXAM: CT ABDOMEN AND PELVIS WITH CONTRAST TECHNIQUE:  Multidetector CT imaging of the abdomen and pelvis was performed using the standard protocol following bolus administration of intravenous contrast. CONTRAST:  141mL ISOVUE-300 IOPAMIDOL (ISOVUE-300) INJECTION 61% COMPARISON:  Prior CT from 12/31/2016. FINDINGS: Lower chest: Minimal linear atelectatic changes at the left lung base. Visualized lungs are otherwise clear. Hepatobiliary: Liver demonstrates a normal contrast enhanced appearance. Gallbladder within normal limits. No biliary dilatation. Pancreas: Pancreas within normal limits. Spleen: Spleen within normal limits. Adrenals/Urinary Tract: Adrenal glands are normal. Kidneys equal size with symmetric enhancement. No nephrolithiasis, hydronephrosis, or focal enhancing renal mass. No appreciable hydroureter. Bladder moderately distended without acute abnormality. Stomach/Bowel: Stomach decompressed without acute abnormality. No evidence for bowel obstruction. Appendix is surgically absent. Colon diffusely decompressed. No acute inflammatory changes about the bowels. Vascular/Lymphatic: Normal intravascular enhancement seen throughout the intra-abdominal aorta. Mild aorto bi-iliac atherosclerotic disease. No aneurysm. Mesenteric vessels patent proximally. No adenopathy. Reproductive: Uterus and ovaries within normal limits. Other: No free air or fluid. Musculoskeletal: No acute osseus abnormality. No lytic or blastic osseous lesions. Moderate degenerative spondylolysis at L3-4 and L4-5. Small benign bone island noted within the right sacral ala. IMPRESSION: 1. No CT evidence for acute intra-abdominal or pelvic process. 2. Prior appendectomy. 3. Mild aorto bi-iliac atherosclerotic disease.  No aneurysm. 4. Moderate degenerative spondylolysis at L3-4 and L4-5. Electronically Signed   By: Jeannine Boga M.D.   On: 12/04/2017 05:49   US Abdomen Limited  Result Date: 12/06/2017 CLINICAL DATA:  Right upper quadrant pain for 5 days EXAM: ULTRASOUND ABDOMEN  LIMITED RIGHT UPPER QUADRANT COMPARISON:  CT abdomen pelvis of 12/04/2017 FINDINGS: Gallbladder: The gallbladder is visualized and no gallstones are noted. There is no pain over the gallbladder with compression. Common bile duct: Diameter: The common bile duct is normal measuring 4.4 mm in diameter. Liver: The parenchyma of the liver is normal in echogenicity. No focal hepatic abnormality is seen. Portal vein is patent on color Doppler imaging with normal direction of blood flow towards the liver. IMPRESSION: Negative limited ultrasound of the right upper quadrant. Electronically Signed   By: Ivar Drape M.D.   On: 12/06/2017 12:14     PERTINENT LAB RESULTS: CBC: Recent Labs    12/31/17 1048 12/31/17 1132  WBC 14.3*  --   HGB 12.7 13.9  HCT 39.0 41.0  PLT 522*  --    CMET CMP     Component Value Date/Time   NA 140 01/02/2018 0514   NA 136 03/28/2017 1100   K 4.2 01/02/2018 0514   CL 109 01/02/2018 0514   CO2 24 01/02/2018 0514   GLUCOSE 109 (H) 01/02/2018 0514   BUN 6 01/02/2018 0514   BUN 11 03/28/2017 1100   CREATININE 0.80 01/02/2018 0514   CALCIUM 8.8 (L)  01/02/2018 0514   PROT 6.3 (L) 01/02/2018 0514   PROT 6.9 05/10/2017 0849   ALBUMIN 2.9 (L) 01/02/2018 0514   ALBUMIN 4.3 05/10/2017 0849   AST 40 01/02/2018 0514   ALT 30 01/02/2018 0514   ALKPHOS 57 01/02/2018 0514   BILITOT 1.0 01/02/2018 0514   BILITOT 0.4 05/10/2017 0849   GFRNONAA >60 01/02/2018 0514   GFRAA >60 01/02/2018 0514    GFR Estimated Creatinine Clearance: 77.4 mL/min (by C-G formula based on SCr of 0.8 mg/dL). No results for input(s): LIPASE, AMYLASE in the last 72 hours. No results for input(s): CKTOTAL, CKMB, CKMBINDEX, TROPONINI in the last 72 hours. Invalid input(s): POCBNP No results for input(s): DDIMER in the last 72 hours. No results for input(s): HGBA1C in the last 72 hours. No results for input(s): CHOL, HDL, LDLCALC, TRIG, CHOLHDL, LDLDIRECT in the last 72 hours. Recent Labs     12/31/17 2017  TSH 2.287   No results for input(s): VITAMINB12, FOLATE, FERRITIN, TIBC, IRON, RETICCTPCT in the last 72 hours. Coags: No results for input(s): INR in the last 72 hours.  Invalid input(s): PT Microbiology: Recent Results (from the past 240 hour(s))  Urine culture     Status: None   Collection Time: 12/31/17 11:31 AM  Result Value Ref Range Status   Specimen Description URINE, RANDOM  Final   Special Requests NONE  Final   Culture   Final    NO GROWTH Performed at [facility], [address] [place], [place] [address]    Report Status 01/01/2018 FINAL  Final  MRSA PCR Screening     Status: None   Collection Time: 12/31/17  4:42 PM  Result Value Ref Range Status   MRSA by PCR NEGATIVE NEGATIVE Final    Comment:        The GeneXpert MRSA Assay (FDA approved for NASAL specimens only), is one component of a comprehensive MRSA colonization surveillance program. It is not intended to diagnose MRSA infection nor to guide or monitor treatment for MRSA infections. Performed at [facility], [place] [address] [place], [place] [address]     FURTHER DISCHARGE INSTRUCTIONS:  Get Medicines reviewed and adjusted: Please take all your medications with you for your next visit with your Primary MD  Laboratory/radiological data: Please request your Primary MD to go over all hospital tests and procedure/radiological results at the follow up, please ask your Primary MD to get all Hospital records sent to his/her office.  In some cases, they will be blood work, cultures and biopsy results pending at the time of your discharge. Please request that your primary care M.D. goes through all the records of your hospital data and follows up on these results.  Also Note the following: If you experience worsening of your admission symptoms, develop shortness of breath, life threatening emergency, suicidal or homicidal thoughts you must seek medical attention immediately  by calling 911 or calling your MD immediately  if symptoms less severe.  You must read complete instructions/literature along with all the possible adverse reactions/side effects for all the Medicines you take and that have been prescribed to you. Take any new Medicines after you have completely understood and accpet all the possible adverse reactions/side effects.   Do not drive when taking Pain medications or sleeping medications (Benzodaizepines)  Do not take more than prescribed Pain, Sleep and Anxiety Medications. It is not advisable to combine anxiety,sleep and pain medications without talking with your primary care practitioner  Special Instructions: If you have smoked or chewed Tobacco  in the last 2 yrs please stop smoking, stop any regular Alcohol  and or any Recreational drug use.  Wear Seat belts while driving.  Please note: You were cared for by a hospitalist during your hospital stay. Once you are discharged, your primary care physician will handle any further medical issues. Please note that NO REFILLS for any discharge medications will be authorized once you are discharged, as it is imperative that you return to your primary care physician (or establish a relationship with a primary care physician if you do not have one) for your post hospital discharge needs so that they can reassess your need for medications and monitor your lab values.  Total Time spent coordinating discharge including counseling, education and face to face time equals 25 minutes.  SignedOren Binet 01/02/2018 8:50 AM

## 2018-01-02 NOTE — Progress Notes (Signed)
[place] discharged Home per MD order.  Discharge instructions reviewed and discussed with the patient, all questions and concerns answered. Copy of instructions, care notes for new medication/diagnosis  given to patient.  Allergies as of 01/02/2018      Reactions   Ciprofloxin Hcl [ciprofloxacin] Nausea And Vomiting, Other (See Comments)   Severe migraine   Food Color Red [red Dye] Diarrhea      Medication List    TAKE these medications   albuterol 108 (90 Base) MCG/ACT inhaler Commonly known as:  PROVENTIL HFA;VENTOLIN HFA Inhale 2 puffs into the lungs every 6 (six) hours as needed for wheezing or shortness of breath.   aspirin 81 MG EC tablet Take 1 tablet (81 mg total) by mouth daily.   atorvastatin 80 MG tablet Commonly known as:  LIPITOR Take 1 tablet (80 mg total) by mouth at bedtime.   b complex vitamins tablet Take 1 tablet by mouth daily.   clopidogrel 75 MG tablet Commonly known as:  PLAVIX Take 1 tablet (75 mg total) by mouth daily.   ENLITE GLUCOSE SENSOR Misc 1 Device by Does not apply route once a week.   FLUoxetine 10 MG capsule Commonly known as:  PROZAC Take 1 capsule (10 mg total) by mouth daily.   FLUoxetine 40 MG capsule Commonly known as:  PROZAC Take 1 capsule (40 mg total) by mouth daily.   glucose blood test strip Test blood glucose 2-3 times a day. Dx code: 250.01   HYDROcodone-acetaminophen 5-325 MG tablet Commonly known as:  NORCO/VICODIN Take 1 tablet by mouth every 4 (four) hours as needed (for pain).   insulin aspart 100 UNIT/ML injection Commonly known as:  novoLOG 35 units per insulin pump daily   levothyroxine 100 MCG tablet Commonly known as:  SYNTHROID, LEVOTHROID Take 1 tablet (100 mcg total) by mouth daily before breakfast.   metoprolol succinate 25 MG 24 hr tablet Commonly known as:  TOPROL-XL Take 0.5 tablets (12.5 mg total) by mouth daily.   MINIMED INFUSION SET-MMT 71 Misc See admin instructions. Use with  Humulin insulin - continuous - basal rate 0.625   montelukast 10 MG tablet Commonly known as:  SINGULAIR Take 1 tablet (10 mg total) by mouth at bedtime.   nitroGLYCERIN 0.4 MG SL tablet Commonly known as:  NITROSTAT Place 1 tablet (0.4 mg total) under the tongue every 5 (five) minutes as needed for chest pain.   ondansetron 4 MG tablet Commonly known as:  ZOFRAN Take 1 tablet (4 mg total) by mouth every 8 (eight) hours as needed for nausea or vomiting.   pantoprazole 40 MG tablet Commonly known as:  PROTONIX Take 1 tablet (40 mg total) by mouth daily.       IV site discontinued and catheter remains intact. Site without signs and symptoms of complications. Dressing and pressure applied.  Patient escorted to car by NT/volunteer in a wheelchair,  no distress noted upon discharge.  Wynetta Emery, Naftula Donahue C 01/02/2018 10:44 AM

## 2018-01-02 NOTE — Progress Notes (Signed)
Pt's CBG assessed and noted to be 51. Pt's home insulin pump being used currently. RN supplied orange juice, and ham sandwich to patient. Pt currently a/o X 4, and asymptomatic. RN to reassess CBG in 15 min.

## 2018-02-08 ENCOUNTER — Inpatient Hospital Stay (HOSPITAL_COMMUNITY): Payer: Self-pay

## 2018-02-08 ENCOUNTER — Emergency Department (HOSPITAL_COMMUNITY): Payer: Self-pay

## 2018-02-08 ENCOUNTER — Telehealth: Payer: Self-pay

## 2018-02-08 ENCOUNTER — Inpatient Hospital Stay (HOSPITAL_COMMUNITY)
Admission: EM | Admit: 2018-02-08 | Discharge: 2018-02-11 | DRG: 246 | Disposition: A | Discharge status: Home / Self Care | Payer: Self-pay | Attending: Interventional Cardiology | Admitting: Interventional Cardiology

## 2018-02-08 ENCOUNTER — Encounter (HOSPITAL_COMMUNITY): Payer: Self-pay | Admitting: Emergency Medicine

## 2018-02-08 ENCOUNTER — Encounter (HOSPITAL_COMMUNITY)
Admission: EM | Disposition: A | Discharge status: Home / Self Care | Payer: Self-pay | Source: Home / Self Care | Attending: Interventional Cardiology

## 2018-02-08 DIAGNOSIS — F1210 Cannabis abuse, uncomplicated: Secondary | ICD-10-CM | POA: Diagnosis present

## 2018-02-08 DIAGNOSIS — I255 Ischemic cardiomyopathy: Secondary | ICD-10-CM | POA: Diagnosis present

## 2018-02-08 DIAGNOSIS — E785 Hyperlipidemia, unspecified: Secondary | ICD-10-CM | POA: Diagnosis present

## 2018-02-08 DIAGNOSIS — Z9641 Presence of insulin pump (external) (internal): Secondary | ICD-10-CM | POA: Diagnosis present

## 2018-02-08 DIAGNOSIS — Z825 Family history of asthma and other chronic lower respiratory diseases: Secondary | ICD-10-CM

## 2018-02-08 DIAGNOSIS — F32A Depression, unspecified: Secondary | ICD-10-CM | POA: Diagnosis present

## 2018-02-08 DIAGNOSIS — Z9089 Acquired absence of other organs: Secondary | ICD-10-CM

## 2018-02-08 DIAGNOSIS — R079 Chest pain, unspecified: Secondary | ICD-10-CM

## 2018-02-08 DIAGNOSIS — I2510 Atherosclerotic heart disease of native coronary artery without angina pectoris: Secondary | ICD-10-CM

## 2018-02-08 DIAGNOSIS — Z807 Family history of other malignant neoplasms of lymphoid, hematopoietic and related tissues: Secondary | ICD-10-CM

## 2018-02-08 DIAGNOSIS — Z955 Presence of coronary angioplasty implant and graft: Secondary | ICD-10-CM

## 2018-02-08 DIAGNOSIS — R072 Precordial pain: Secondary | ICD-10-CM

## 2018-02-08 DIAGNOSIS — E860 Dehydration: Secondary | ICD-10-CM | POA: Diagnosis present

## 2018-02-08 DIAGNOSIS — Z8249 Family history of ischemic heart disease and other diseases of the circulatory system: Secondary | ICD-10-CM

## 2018-02-08 DIAGNOSIS — I9581 Postprocedural hypotension: Secondary | ICD-10-CM | POA: Diagnosis not present

## 2018-02-08 DIAGNOSIS — I214 Non-ST elevation (NSTEMI) myocardial infarction: Secondary | ICD-10-CM

## 2018-02-08 DIAGNOSIS — I252 Old myocardial infarction: Secondary | ICD-10-CM

## 2018-02-08 DIAGNOSIS — Z7982 Long term (current) use of aspirin: Secondary | ICD-10-CM

## 2018-02-08 DIAGNOSIS — E109 Type 1 diabetes mellitus without complications: Secondary | ICD-10-CM | POA: Diagnosis present

## 2018-02-08 DIAGNOSIS — I213 ST elevation (STEMI) myocardial infarction of unspecified site: Principal | ICD-10-CM | POA: Diagnosis present

## 2018-02-08 DIAGNOSIS — E7800 Pure hypercholesterolemia, unspecified: Secondary | ICD-10-CM

## 2018-02-08 DIAGNOSIS — E1110 Type 2 diabetes mellitus with ketoacidosis without coma: Secondary | ICD-10-CM | POA: Diagnosis present

## 2018-02-08 DIAGNOSIS — F329 Major depressive disorder, single episode, unspecified: Secondary | ICD-10-CM | POA: Diagnosis present

## 2018-02-08 DIAGNOSIS — E1010 Type 1 diabetes mellitus with ketoacidosis without coma: Secondary | ICD-10-CM | POA: Diagnosis present

## 2018-02-08 DIAGNOSIS — Z79890 Hormone replacement therapy (postmenopausal): Secondary | ICD-10-CM

## 2018-02-08 DIAGNOSIS — E039 Hypothyroidism, unspecified: Secondary | ICD-10-CM | POA: Diagnosis present

## 2018-02-08 DIAGNOSIS — Z79899 Other long term (current) drug therapy: Secondary | ICD-10-CM

## 2018-02-08 DIAGNOSIS — Z79891 Long term (current) use of opiate analgesic: Secondary | ICD-10-CM

## 2018-02-08 DIAGNOSIS — Z794 Long term (current) use of insulin: Secondary | ICD-10-CM

## 2018-02-08 DIAGNOSIS — I249 Acute ischemic heart disease, unspecified: Secondary | ICD-10-CM

## 2018-02-08 DIAGNOSIS — R7989 Other specified abnormal findings of blood chemistry: Secondary | ICD-10-CM

## 2018-02-08 HISTORY — DX: Non-ST elevation (NSTEMI) myocardial infarction: I21.4

## 2018-02-08 HISTORY — PX: LEFT HEART CATH AND CORONARY ANGIOGRAPHY: CATH118249

## 2018-02-08 HISTORY — PX: CORONARY/GRAFT ACUTE MI REVASCULARIZATION: CATH118305

## 2018-02-08 LAB — BASIC METABOLIC PANEL
ANION GAP: 11 (ref 5–15)
Anion gap: 19 — ABNORMAL HIGH (ref 5–15)
Anion gap: 14 (ref 5–15)
Anion gap: 10 (ref 5–15)
Anion gap: 13 (ref 5–15)
BUN: 16 mg/dL (ref 6–20)
BUN: 16 mg/dL (ref 6–20)
BUN: 16 mg/dL (ref 6–20)
BUN: 14 mg/dL (ref 6–20)
BUN: 13 mg/dL (ref 6–20)
CO2: 19 mmol/L — ABNORMAL LOW (ref 22–32)
CO2: 14 mmol/L — ABNORMAL LOW (ref 22–32)
CO2: 14 mmol/L — ABNORMAL LOW (ref 22–32)
CO2: 13 mmol/L — AB (ref 22–32)
CO2: 13 mmol/L — ABNORMAL LOW (ref 22–32)
Calcium: 9.9 mg/dL (ref 8.9–10.3)
Calcium: 8.1 mg/dL — ABNORMAL LOW (ref 8.9–10.3)
Calcium: 7.9 mg/dL — ABNORMAL LOW (ref 8.9–10.3)
Calcium: 8.1 mg/dL — ABNORMAL LOW (ref 8.9–10.3)
Calcium: 8 mg/dL — ABNORMAL LOW (ref 8.9–10.3)
Chloride: 98 mmol/L (ref 98–111)
Chloride: 110 mmol/L (ref 98–111)
Chloride: 113 mmol/L — ABNORMAL HIGH (ref 98–111)
Chloride: 108 mmol/L (ref 98–111)
Chloride: 110 mmol/L (ref 98–111)
Creatinine, Ser: 1.07 mg/dL — ABNORMAL HIGH (ref 0.44–1.00)
Creatinine, Ser: 1.11 mg/dL — ABNORMAL HIGH (ref 0.44–1.00)
Creatinine, Ser: 1.04 mg/dL — ABNORMAL HIGH (ref 0.44–1.00)
Creatinine, Ser: 0.88 mg/dL (ref 0.44–1.00)
Creatinine, Ser: 0.94 mg/dL (ref 0.44–1.00)
GFR calc Af Amer: >60 mL/min (ref >60)
GFR calc Af Amer: >60 mL/min (ref >60)
GFR calc Af Amer: >60 mL/min (ref >60)
GFR calc Af Amer: >60 mL/min (ref >60)
GFR calc Af Amer: >60 mL/min (ref >60)
GFR calc non Af Amer: >60 mL/min (ref >60)
GFR calc non Af Amer: 58 mL/min — ABNORMAL LOW (ref >60)
GFR calc non Af Amer: >60 mL/min (ref >60)
GFR calc non Af Amer: >60 mL/min (ref >60)
GFR calc non Af Amer: >60 mL/min (ref >60)
GLUCOSE: 150 mg/dL — AB (ref 70–99)
Glucose, Bld: 349 mg/dL — ABNORMAL HIGH (ref 70–99)
Glucose, Bld: 133 mg/dL — ABNORMAL HIGH (ref 70–99)
Glucose, Bld: 218 mg/dL — ABNORMAL HIGH (ref 70–99)
Glucose, Bld: 277 mg/dL — ABNORMAL HIGH (ref 70–99)
POTASSIUM: 4.3 mmol/L (ref 3.5–5.1)
Potassium: 4 mmol/L (ref 3.5–5.1)
Potassium: 3.9 mmol/L (ref 3.5–5.1)
Potassium: 6.2 mmol/L — ABNORMAL HIGH (ref 3.5–5.1)
Potassium: 3.8 mmol/L (ref 3.5–5.1)
Sodium: 136 mmol/L (ref 135–145)
Sodium: 138 mmol/L (ref 135–145)
Sodium: 137 mmol/L (ref 135–145)
Sodium: 134 mmol/L — ABNORMAL LOW (ref 135–145)
Sodium: 134 mmol/L — ABNORMAL LOW (ref 135–145)

## 2018-02-08 LAB — I-STAT TROPONIN, ED: TROPONIN I, POC: 0.19 ng/mL — AB (ref 0.00–0.08)

## 2018-02-08 LAB — CBC
HCT: 41.4 % (ref 36.0–46.0)
HEMOGLOBIN: 13.7 g/dL (ref 12.0–15.0)
MCH: 30.1 pg (ref 26.0–34.0)
MCHC: 33.1 g/dL (ref 30.0–36.0)
MCV: 91 fL (ref 80.0–100.0)
Platelets: 580 10*3/uL — ABNORMAL HIGH (ref 150–400)
RBC: 4.55 MIL/uL (ref 3.87–5.11)
RDW: 13.7 % (ref 11.5–15.5)
WBC: 11.1 10*3/uL — ABNORMAL HIGH (ref 4.0–10.5)
nRBC: 0 % (ref 0.0–0.2)

## 2018-02-08 LAB — GLUCOSE, CAPILLARY
GLUCOSE-CAPILLARY: 144 mg/dL — AB (ref 70–99)
GLUCOSE-CAPILLARY: 91 mg/dL (ref 70–99)
GLUCOSE-CAPILLARY: 297 mg/dL — AB (ref 70–99)
Glucose-Capillary: 427 mg/dL — ABNORMAL HIGH (ref 70–99)
Glucose-Capillary: 307 mg/dL — ABNORMAL HIGH (ref 70–99)
Glucose-Capillary: 183 mg/dL — ABNORMAL HIGH (ref 70–99)
Glucose-Capillary: 183 mg/dL — ABNORMAL HIGH (ref 70–99)
Glucose-Capillary: 202 mg/dL — ABNORMAL HIGH (ref 70–99)
Glucose-Capillary: 168 mg/dL — ABNORMAL HIGH (ref 70–99)
Glucose-Capillary: 56 mg/dL — ABNORMAL LOW (ref 70–99)
Glucose-Capillary: 56 mg/dL — ABNORMAL LOW (ref 70–99)
Glucose-Capillary: 174 mg/dL — ABNORMAL HIGH (ref 70–99)
Glucose-Capillary: 279 mg/dL — ABNORMAL HIGH (ref 70–99)
Glucose-Capillary: 245 mg/dL — ABNORMAL HIGH (ref 70–99)
Glucose-Capillary: 138 mg/dL — ABNORMAL HIGH (ref 70–99)

## 2018-02-08 LAB — POCT ACTIVATED CLOTTING TIME
Activated Clotting Time: 219 seconds
Activated Clotting Time: 296 seconds
Activated Clotting Time: 351 seconds

## 2018-02-08 LAB — PROTIME-INR
INR: 1.05
Prothrombin Time: 13.6 seconds (ref 11.4–15.2)

## 2018-02-08 LAB — LIPID PANEL
Cholesterol: 290 mg/dL — ABNORMAL HIGH (ref 0–200)
HDL: 92 mg/dL (ref >40)
LDL Cholesterol: 177 mg/dL — ABNORMAL HIGH (ref 0–99)
Total CHOL/HDL Ratio: 3.2 RATIO
Triglycerides: 107 mg/dL (ref <150)
VLDL: 21 mg/dL (ref 0–40)

## 2018-02-08 LAB — MAGNESIUM
MAGNESIUM: 2.5 mg/dL — AB (ref 1.7–2.4)
Magnesium: 1.6 mg/dL — ABNORMAL LOW (ref 1.7–2.4)
Magnesium: 2.2 mg/dL (ref 1.7–2.4)

## 2018-02-08 LAB — TSH: TSH: 0.934 u[IU]/mL (ref 0.350–4.500)

## 2018-02-08 LAB — APTT: aPTT: 28 seconds (ref 24–36)

## 2018-02-08 LAB — TROPONIN I
TROPONIN I: 0.78 ng/mL — AB (ref <0.03)
Troponin I: 0.32 ng/mL (ref <0.03)
Troponin I: 0.74 ng/mL (ref <0.03)

## 2018-02-08 LAB — I-STAT BETA HCG BLOOD, ED (MC, WL, AP ONLY): I-stat hCG, quantitative: <5 m[IU]/mL (ref <5)

## 2018-02-08 LAB — PLATELET COUNT: Platelets: 420 10*3/uL — ABNORMAL HIGH (ref 150–400)

## 2018-02-08 LAB — ECHOCARDIOGRAM COMPLETE
Height: 65 in
Weight: 2320 oz

## 2018-02-08 LAB — MRSA PCR SCREENING: MRSA by PCR: NEGATIVE

## 2018-02-08 SURGERY — CORONARY/GRAFT ACUTE MI REVASCULARIZATION
Anesthesia: LOCAL

## 2018-02-08 MED ORDER — TIROFIBAN (AGGRASTAT) BOLUS VIA INFUSION
INTRAVENOUS | Status: DC | PRN
  Administered 2018-02-08: 1645 ug via INTRAVENOUS

## 2018-02-08 MED ORDER — VERAPAMIL HCL 2.5 MG/ML IV SOLN
INTRAVENOUS | Status: AC
  Filled 2018-02-08: qty 2

## 2018-02-08 MED ORDER — FLUOXETINE HCL 20 MG PO CAPS
50.0000 mg | ORAL_CAPSULE | Freq: Every day | ORAL | Status: DC
  Administered 2018-02-09 – 2018-02-11 (×3): 50 mg via ORAL
  Filled 2018-02-08 (×3): qty 1
  Filled 2018-02-08: qty 2

## 2018-02-08 MED ORDER — SODIUM CHLORIDE 0.9% FLUSH: 3.0000 mL | Freq: Two times a day (BID) | INTRAVENOUS | Status: DC

## 2018-02-08 MED ORDER — ATORVASTATIN CALCIUM 80 MG PO TABS: 80.0000 mg | ORAL_TABLET | Freq: Every day | ORAL | Status: DC

## 2018-02-08 MED ORDER — ONDANSETRON HCL 4 MG/2ML IJ SOLN
4.0000 mg | Freq: Once | INTRAMUSCULAR | Status: AC
  Administered 2018-02-08: 4 mg via INTRAVENOUS
  Filled 2018-02-08: qty 2

## 2018-02-08 MED ORDER — TIROFIBAN HCL IN NACL 5-0.9 MG/100ML-% IV SOLN
INTRAVENOUS | Status: AC
  Filled 2018-02-08: qty 100

## 2018-02-08 MED ORDER — MORPHINE SULFATE (PF) 4 MG/ML IV SOLN
4.0000 mg | Freq: Once | INTRAVENOUS | Status: AC
  Administered 2018-02-08: 4 mg via INTRAVENOUS
  Filled 2018-02-08: qty 1

## 2018-02-08 MED ORDER — DEXTROSE 50 % IV SOLN
18.0000 mL | Freq: Once | INTRAVENOUS | Status: AC
  Administered 2018-02-08: 18 mL via INTRAVENOUS
  Filled 2018-02-08: qty 50

## 2018-02-08 MED ORDER — ONDANSETRON HCL 4 MG/2ML IJ SOLN
INTRAMUSCULAR | Status: AC
  Filled 2018-02-08: qty 2

## 2018-02-08 MED ORDER — ASPIRIN 81 MG PO CHEW
81.0000 mg | CHEWABLE_TABLET | Freq: Every day | ORAL | Status: DC
  Administered 2018-02-08 – 2018-02-11 (×4): 81 mg via ORAL
  Filled 2018-02-08 (×4): qty 1

## 2018-02-08 MED ORDER — ASPIRIN 81 MG PO CHEW
324.0000 mg | CHEWABLE_TABLET | Freq: Once | ORAL | Status: AC
  Administered 2018-02-08: 324 mg via ORAL
  Filled 2018-02-08: qty 4

## 2018-02-08 MED ORDER — SODIUM CHLORIDE 0.9 % IV SOLN
INTRAVENOUS | Status: DC
  Administered 2018-02-08: 500 mL via INTRAVENOUS

## 2018-02-08 MED ORDER — LEVOTHYROXINE SODIUM 100 MCG PO TABS
100.0000 ug | ORAL_TABLET | Freq: Every day | ORAL | Status: DC
  Administered 2018-02-09 – 2018-02-11 (×3): 100 ug via ORAL
  Filled 2018-02-08 (×3): qty 1

## 2018-02-08 MED ORDER — HEPARIN (PORCINE) IN NACL 1000-0.9 UT/500ML-% IV SOLN
INTRAVENOUS | Status: DC | PRN
  Administered 2018-02-08 (×2): 500 mL

## 2018-02-08 MED ORDER — FENTANYL CITRATE (PF) 100 MCG/2ML IJ SOLN
INTRAMUSCULAR | Status: DC | PRN
  Administered 2018-02-08: 25 ug via INTRAVENOUS

## 2018-02-08 MED ORDER — METOPROLOL TARTRATE 5 MG/5ML IV SOLN
INTRAVENOUS | Status: AC
  Filled 2018-02-08: qty 5

## 2018-02-08 MED ORDER — TIROFIBAN HCL IN NACL 5-0.9 MG/100ML-% IV SOLN
0.1500 ug/kg/min | INTRAVENOUS | Status: AC
  Administered 2018-02-08 (×2): 0.15 ug/kg/min via INTRAVENOUS
  Filled 2018-02-08 (×2): qty 100

## 2018-02-08 MED ORDER — CALCIUM GLUCONATE-NACL 2-0.675 GM/100ML-% IV SOLN
2.0000 g | Freq: Once | INTRAVENOUS | Status: DC
  Filled 2018-02-08: qty 100

## 2018-02-08 MED ORDER — ACETAMINOPHEN 325 MG PO TABS: 650.0000 mg | ORAL_TABLET | ORAL | Status: DC | PRN

## 2018-02-08 MED ORDER — METOPROLOL TARTRATE 25 MG PO TABS
25.0000 mg | ORAL_TABLET | Freq: Two times a day (BID) | ORAL | Status: DC
  Administered 2018-02-08 – 2018-02-11 (×7): 25 mg via ORAL
  Filled 2018-02-08 (×8): qty 1

## 2018-02-08 MED ORDER — CLOPIDOGREL BISULFATE 300 MG PO TABS
ORAL_TABLET | ORAL | Status: AC
  Filled 2018-02-08: qty 1

## 2018-02-08 MED ORDER — CLOPIDOGREL BISULFATE 300 MG PO TABS
ORAL_TABLET | ORAL | Status: DC | PRN
  Administered 2018-02-08: 300 mg via ORAL

## 2018-02-08 MED ORDER — FENTANYL CITRATE (PF) 100 MCG/2ML IJ SOLN
INTRAMUSCULAR | Status: AC
  Filled 2018-02-08: qty 2

## 2018-02-08 MED ORDER — HEPARIN SODIUM (PORCINE) 5000 UNIT/ML IJ SOLN
5000.0000 [IU] | Freq: Three times a day (TID) | INTRAMUSCULAR | Status: DC
  Administered 2018-02-08 – 2018-02-11 (×8): 5000 [IU] via SUBCUTANEOUS
  Filled 2018-02-08 (×8): qty 1

## 2018-02-08 MED ORDER — POTASSIUM CHLORIDE 10 MEQ/100ML IV SOLN
10.0000 meq | INTRAVENOUS | Status: AC
  Administered 2018-02-08 (×2): 10 meq via INTRAVENOUS
  Filled 2018-02-08 (×2): qty 100

## 2018-02-08 MED ORDER — SODIUM CHLORIDE 0.9 % IV SOLN: INTRAVENOUS | Status: DC

## 2018-02-08 MED ORDER — SODIUM CHLORIDE 0.9 % WEIGHT BASED INFUSION: 1.5000 mL/kg/h | INTRAVENOUS | Status: DC

## 2018-02-08 MED ORDER — HEPARIN SODIUM (PORCINE) 1000 UNIT/ML IJ SOLN
INTRAMUSCULAR | Status: DC | PRN
  Administered 2018-02-08: 2000 [IU] via INTRAVENOUS
  Administered 2018-02-08: 5000 [IU] via INTRAVENOUS

## 2018-02-08 MED ORDER — INSULIN REGULAR(HUMAN) IN NACL 100-0.9 UT/100ML-% IV SOLN
INTRAVENOUS | Status: AC
  Administered 2018-02-08: 2.5 [IU]/h via INTRAVENOUS
  Administered 2018-02-09: 1.6 [IU]/h via INTRAVENOUS
  Filled 2018-02-08 (×2): qty 100

## 2018-02-08 MED ORDER — ONDANSETRON HCL 4 MG/2ML IJ SOLN
4.0000 mg | Freq: Four times a day (QID) | INTRAMUSCULAR | Status: DC | PRN
  Administered 2018-02-08 – 2018-02-09 (×4): 4 mg via INTRAVENOUS
  Filled 2018-02-08 (×3): qty 2

## 2018-02-08 MED ORDER — CLOPIDOGREL BISULFATE 75 MG PO TABS
75.0000 mg | ORAL_TABLET | Freq: Every day | ORAL | Status: DC
  Administered 2018-02-09 – 2018-02-11 (×3): 75 mg via ORAL
  Filled 2018-02-08 (×3): qty 1

## 2018-02-08 MED ORDER — LABETALOL HCL 5 MG/ML IV SOLN: 10.0000 mg | INTRAVENOUS | Status: AC | PRN

## 2018-02-08 MED ORDER — ONDANSETRON HCL 4 MG/2ML IJ SOLN
INTRAMUSCULAR | Status: DC | PRN
  Administered 2018-02-08: 4 mg via INTRAVENOUS

## 2018-02-08 MED ORDER — MIDAZOLAM HCL 2 MG/2ML IJ SOLN
INTRAMUSCULAR | Status: AC
  Filled 2018-02-08: qty 2

## 2018-02-08 MED ORDER — VERAPAMIL HCL 2.5 MG/ML IV SOLN
INTRAVENOUS | Status: DC | PRN
  Administered 2018-02-08: 10 mL via INTRA_ARTERIAL

## 2018-02-08 MED ORDER — SODIUM CHLORIDE 0.9 % IV SOLN
INTRAVENOUS | Status: AC | PRN
  Administered 2018-02-08: 20 mL/h via INTRAVENOUS

## 2018-02-08 MED ORDER — SODIUM CHLORIDE 0.9% FLUSH: 3.0000 mL | INTRAVENOUS | Status: DC | PRN

## 2018-02-08 MED ORDER — METOPROLOL TARTRATE 5 MG/5ML IV SOLN
INTRAVENOUS | Status: DC | PRN
  Administered 2018-02-08 (×2): 5 mg via INTRAVENOUS

## 2018-02-08 MED ORDER — SODIUM CHLORIDE 0.9 % IV SOLN
INTRAVENOUS | Status: AC
  Administered 2018-02-08: 11:00:00 via INTRAVENOUS
  Administered 2018-02-08: 999 mL/h via INTRAVENOUS

## 2018-02-08 MED ORDER — NITROGLYCERIN 1 MG/10 ML FOR IR/CATH LAB
INTRA_ARTERIAL | Status: AC
  Filled 2018-02-08: qty 10

## 2018-02-08 MED ORDER — SODIUM CHLORIDE 0.9 % IV SOLN: 250.0000 mL | INTRAVENOUS | Status: DC | PRN

## 2018-02-08 MED ORDER — TIROFIBAN HCL IN NACL 5-0.9 MG/100ML-% IV SOLN
INTRAVENOUS | Status: AC | PRN
  Administered 2018-02-08: 0.075 ug/kg/min via INTRAVENOUS

## 2018-02-08 MED ORDER — HEPARIN (PORCINE) IN NACL 1000-0.9 UT/500ML-% IV SOLN
INTRAVENOUS | Status: AC
  Filled 2018-02-08: qty 1000

## 2018-02-08 MED ORDER — MORPHINE SULFATE (PF) 2 MG/ML IV SOLN
2.0000 mg | INTRAVENOUS | Status: DC | PRN
  Administered 2018-02-08 – 2018-02-09 (×2): 2 mg via INTRAVENOUS
  Filled 2018-02-08 (×2): qty 1

## 2018-02-08 MED ORDER — IOHEXOL 350 MG/ML SOLN
INTRAVENOUS | Status: DC | PRN
  Administered 2018-02-08: 200 mL via INTRA_ARTERIAL

## 2018-02-08 MED ORDER — NITROGLYCERIN IN D5W 200-5 MCG/ML-% IV SOLN
0.0000 ug/min | INTRAVENOUS | Status: DC
  Administered 2018-02-08: 10 ug/min via INTRAVENOUS

## 2018-02-08 MED ORDER — HEPARIN SODIUM (PORCINE) 5000 UNIT/ML IJ SOLN
60.0000 [IU]/kg | Freq: Once | INTRAMUSCULAR | Status: AC
  Administered 2018-02-08: 3950 [IU] via INTRAVENOUS
  Filled 2018-02-08: qty 1

## 2018-02-08 MED ORDER — LIDOCAINE HCL (PF) 1 % IJ SOLN
INTRAMUSCULAR | Status: AC
  Filled 2018-02-08: qty 30

## 2018-02-08 MED ORDER — NOREPINEPHRINE-SODIUM CHLORIDE 4-0.9 MG/250ML-% IV SOLN
INTRAVENOUS | Status: AC
  Filled 2018-02-08: qty 250

## 2018-02-08 MED ORDER — HEPARIN SODIUM (PORCINE) 1000 UNIT/ML IJ SOLN
INTRAMUSCULAR | Status: AC
  Filled 2018-02-08: qty 1

## 2018-02-08 MED ORDER — NITROGLYCERIN IN D5W 200-5 MCG/ML-% IV SOLN
INTRAVENOUS | Status: AC
  Filled 2018-02-08: qty 250

## 2018-02-08 MED ORDER — OXYCODONE HCL 5 MG PO TABS: 5.0000 mg | ORAL_TABLET | ORAL | Status: DC | PRN

## 2018-02-08 MED ORDER — SODIUM CHLORIDE 0.9 % IV BOLUS
1000.0000 mL | Freq: Once | INTRAVENOUS | Status: AC
  Administered 2018-02-08: 1000 mL via INTRAVENOUS

## 2018-02-08 MED ORDER — HYDRALAZINE HCL 20 MG/ML IJ SOLN: 5.0000 mg | INTRAMUSCULAR | Status: AC | PRN

## 2018-02-08 MED ORDER — MAGNESIUM SULFATE 2 GM/50ML IV SOLN
2.0000 g | Freq: Once | INTRAVENOUS | Status: AC
  Administered 2018-02-08: 2 g via INTRAVENOUS
  Filled 2018-02-08: qty 50

## 2018-02-08 MED ORDER — ROSUVASTATIN CALCIUM 20 MG PO TABS
40.0000 mg | ORAL_TABLET | Freq: Every day | ORAL | Status: DC
  Administered 2018-02-08 – 2018-02-10 (×3): 40 mg via ORAL
  Filled 2018-02-08 (×3): qty 2

## 2018-02-08 MED ORDER — SODIUM CHLORIDE 0.9% FLUSH: 3.0000 mL | Freq: Once | INTRAVENOUS | Status: DC

## 2018-02-08 MED ORDER — DEXTROSE-NACL 5-0.45 % IV SOLN
INTRAVENOUS | Status: DC
  Administered 2018-02-08 – 2018-02-09 (×6): via INTRAVENOUS

## 2018-02-08 MED ORDER — NITROGLYCERIN 1 MG/10 ML FOR IR/CATH LAB
INTRA_ARTERIAL | Status: DC | PRN
  Administered 2018-02-08 (×2): 200 ug via INTRACORONARY

## 2018-02-08 MED ORDER — MIDAZOLAM HCL 2 MG/2ML IJ SOLN
INTRAMUSCULAR | Status: DC | PRN
  Administered 2018-02-08: 0.5 mg via INTRAVENOUS

## 2018-02-08 MED ORDER — INSULIN ASPART 100 UNIT/ML ~~LOC~~ SOLN: 0.0000 [IU] | Freq: Three times a day (TID) | SUBCUTANEOUS | Status: DC

## 2018-02-08 MED ORDER — NITROGLYCERIN 0.4 MG SL SUBL
SUBLINGUAL_TABLET | SUBLINGUAL | Status: AC
  Administered 2018-02-08 (×2): 0.4 mg
  Filled 2018-02-08: qty 1

## 2018-02-08 MED ORDER — LIDOCAINE HCL (PF) 1 % IJ SOLN
INTRAMUSCULAR | Status: DC | PRN
  Administered 2018-02-08: 2 mL via INTRADERMAL

## 2018-02-08 SURGICAL SUPPLY — 19 items
BALLN SAPPHIRE 2.0X12 (BALLOONS) ×2
BALLN SAPPHIRE ~~LOC~~ 2.5X12 (BALLOONS) ×1 IMPLANT
BALLN SAPPHIRE ~~LOC~~ 2.75X8 (BALLOONS) ×1 IMPLANT
BALLOON SAPPHIRE 2.0X12 (BALLOONS) IMPLANT
CATH INFINITI 5 FR JL3.5 (CATHETERS) ×1 IMPLANT
CATH INFINITI JR4 5F (CATHETERS) ×1 IMPLANT
CATH VISTA GUIDE 6FR XB3 (CATHETERS) ×1 IMPLANT
DEVICE RAD COMP TR BAND LRG (VASCULAR PRODUCTS) ×1 IMPLANT
GLIDESHEATH SLEND A-KIT 6F 22G (SHEATH) ×1 IMPLANT
GUIDEWIRE INQWIRE 1.5J.035X260 (WIRE) IMPLANT
INQWIRE 1.5J .035X260CM (WIRE) ×2
KIT ENCORE 26 ADVANTAGE (KITS) ×1 IMPLANT
KIT HEART LEFT (KITS) ×2 IMPLANT
PACK CARDIAC CATHETERIZATION (CUSTOM PROCEDURE TRAY) ×2 IMPLANT
SHEATH PROBE COVER 6X72 (BAG) ×1 IMPLANT
STENT RESOLUTE ONYX 2.25X15 (Permanent Stent) ×1 IMPLANT
TRANSDUCER W/STOPCOCK (MISCELLANEOUS) ×2 IMPLANT
TUBING CIL FLEX 10 FLL-RA (TUBING) ×2 IMPLANT
WIRE MINAMO 190 (WIRE) ×1 IMPLANT

## 2018-02-08 NOTE — Progress Notes (Signed)
Echocardiogram 2D Echocardiogram has been performed.  Jennifer Hogan 02/08/2018, 3:22 PM

## 2018-02-08 NOTE — Consult Note (Addendum)
Medical Consultation   Jennifer Hogan  GXQ:[id number]  DOB: 09-17-1970  DOA: 02/08/2018  PCP: [name], NP   Outpatient Specialists: Loanne Drilling - endocrinology; Radford Pax - cardiology   Requesting physician: Tamala Julian- cardiology  Reason for consultation: Admitted for STEMI this AM, appears to be going into DKA.  Needs ASAP consult.   History of Present Illness: Jennifer Hogan is an 48 y.o. female with h/o CAD s/p stents x 3 in 2/19; hypothyroidism; HLD; type 1 DM on an insulin pump; and depression presenting with STEMI.  Patient lives alone and didn't feel well yesterday.  She took Zofran for nausea and still felt nauseated.  She vomited all night and finally decided to come to the ER due to tachycardia.  She felt like her heart was going to beat out of her chest.  She kind of hurt all over but couldn't specifically describe chest pain.  Glucose had been >500 for 2 days, which was unusual for her.  She thinks her last A1c was about 8.  She does have h/o DKA.  She came in and went to the cath lab for STEMI; she thinks she received a stent.  This AM, she has been tired, exhausted, and thirsty.  No confusion.  At the time of presentation, she felt "different because I was dehydrated."   Review of Systems:  ROS As per HPI otherwise 10 point review of systems negative.    Past Medical History: Past Medical History:  Diagnosis Date  . Allergic state 08/07/2015  . Anxiety   . Coronary artery disease    03/17/17 - DES x 3 LAD, DES OM; 02/08/18 - balloon angioplasty x 4 and stent placement  . Depression   . Diabetes mellitus    type 1, on insulin pump  . DKA (diabetic ketoacidoses) ([place]) 12/31/2017  . High cholesterol   . Hypothyroidism   . Vomiting and diarrhea 12/31/2017    Past Surgical History: Past Surgical History:  Procedure Laterality Date  . APPENDECTOMY    . CORONARY STENT INTERVENTION N/A 03/20/2017   Procedure: DES x 3 LAD, DES OM; Surgeon: Burnell Blanks, MD;  Location: [place] [facility];  Service: Cardiovascular;  Laterality: N/A;  . LEFT HEART CATH AND CORONARY ANGIOGRAPHY N/A 03/20/2017   Procedure: LEFT HEART CATH AND CORONARY ANGIOGRAPHY;  Surgeon: Burnell Blanks, MD;  Location: [place] [facility];  Service: Cardiovascular;  Laterality: N/A;     Allergies:   Allergies  Allergen Reactions  . Ciprofloxin Hcl [Ciprofloxacin] Nausea And Vomiting and Other (See Comments)    Severe migraine  . Food Color Red [Red Dye] Diarrhea     Social History:  reports that she has never smoked. She has never used smokeless tobacco. She reports current drug use. Drug: Marijuana. She reports that she does not drink alcohol.   Family History: Family History  Problem Relation Age of Onset  . Coronary artery disease Father   . Congestive Heart Failure Father   . Asthma Father   . Cancer Mother        T cell lymphoma      Physical Exam: Vitals:   02/08/18 1230 02/08/18 1245 02/08/18 1300 02/08/18 1315  BP: (!) 79/46 (!) 93/57 (!) 83/56 (!) 144/73  Pulse: 91 90 90 (!) 102  Resp: (!) 26 (!) 27 (!) 25 16  Temp:      TempSrc:  SpO2: 97% 95% 96% 97%  Weight:      Height:        Constitutional: Alert and awake, oriented x3, not in any acute distress. Eyes:  EOMI, irises appear normal, anicteric sclera,  ENMT: external ears and nose appear normal, normal hearing, Lips appear normal Neck: neck appears normal, no masses, normal ROM, no thyromegaly, no JVD  CVS: S1-S2 clear, no murmur rubs or gallops, no LE edema, normal pedal pulses, +tachycardia  Respiratory:  clear to auscultation bilaterally, no wheezing, rales or rhonchi. Respiratory effort normal. No accessory muscle use.  Abdomen: soft nontender, nondistended, normal bowel sounds, no hepatosplenomegaly, no hernias  Musculoskeletal: : no cyanosis, clubbing or edema noted bilaterally Neuro: Cranial nerves II-XII intact, strength, sensation, reflexes Psych:  judgement and insight appear normal, stable but blunted mood and affect, mental status Skin: no rashes or lesions or ulcers, no induration or nodules    Data reviewed:  I have personally reviewed the recent labs and imaging studies  Pertinent Labs:   CO2 19 -> 14 Glucose 349, 427, 307, 183, 144, 183 BUN 16/Creatinine 1.07/GFR >60 Anion gap 19 -> 14 Troponin 0.19, 0.32, 0.78 WBC 11.1 Platelets 580 HCG negative Lipids:  290/92/177/107 INR 1.05  Inpatient Medications:   Scheduled Meds: . aspirin  81 mg Oral Daily  . [START ON 02/09/2018] clopidogrel  75 mg Oral Q breakfast  . FLUoxetine  50 mg Oral Daily  . heparin  5,000 Units Subcutaneous Q8H  . levothyroxine  100 mcg Oral Q0600  . metoprolol tartrate  25 mg Oral BID  . norepinephrine      . rosuvastatin  40 mg Oral q1800   Continuous Infusions: . sodium chloride    . dextrose 5 % and 0.45% NaCl 125 mL/hr at 02/08/18 1113  . insulin 2.5 Units/hr (02/08/18 1004)  . magnesium sulfate 1 - 4 g bolus IVPB    . nitroGLYCERIN 5 mcg/min (02/08/18 0745)  . nitroGLYCERIN 10 mcg/min (02/08/18 0952)  . tirofiban       Radiological Exams on Admission: Dg Chest Port 1 View  Result Date: 02/08/2018 CLINICAL DATA:  Chest pain EXAM: PORTABLE CHEST 1 VIEW COMPARISON:  03/15/2017 FINDINGS: Normal heart size and mediastinal contours. Coronary stents. There is no edema, consolidation, effusion, or pneumothorax. Midthoracic degenerative disc narrowing. IMPRESSION: No evidence of active disease. Electronically Signed   By: Monte Fantasia M.D.   On: 02/08/2018 05:09    Impression/Recommendations Principal Problem:   ACS (acute coronary syndrome) (HCC) Active Problems:   Depression   Hypothyroidism   Type 1 diabetes mellitus ([place])   Pure hypercholesterolemia   DKA (diabetic ketoacidosis) ([place])   Marijuana abuse  ACS -Patient presented with fairly nonspecific symptoms but dynamic EKG changes and positive troponin concerning for  STEMI -She was taken urgently to the cath lab and required balloon angioplasty x 4 as well as stent placement -She had diffuse disease and will need aggressive medical management -On NTG drip with plan to wean tomorrow and transition to Imdur -She is on Lopressor; ASA; Plavix; and Aggrastat - per cardiology  DKA -Patient with poor baseline control (see below) -She reports 2 days of 500+ blood sugars -No indication of illness as source -Most likely reason for DKA is ischemic heart disease/ACS -Moderate DKA on admission based on CO2 14, patient alert and possibly a bit drowsy -Will initiate DKA protocol -Would recommend continuing insulin drip at least until morning regardless of rapidity of closure of gap and normalization of labs -  K+ normal at time of presentation and so potassium supplementation added -IVF at 150 cc/hr, NS until glucose <250 and then decrease rate to 125 and change to D51/2NS -Discontinue insulin pump for now  Poorly controlled type 1 DM -She was previously admitted for DKA from 12/9-11 -A1c on 11/15 was 9.7, indicating poor control -Resume insulin pump when she transitions off DKA protocol, but she needs better control as an outpatient  Hypothyroidism -Normal TSH and free T4 on 12/31/17 and normal TSH today -Continue Synthroid at current dose for now  HLD -Per cardiology, she stopped taking her Lipitor as an outpatient due to intolerance -They appear to plan to rx Repatha -She was given Crestor for now  Depression -Continue Paxil  Marijuana abuse -Cessation should be encouraged on an ongoing basis -UDS ordered on prior visits and positive for [facility]    Thank you for this consultation.  Our [facility] hospitalist team will follow the patient with you.   Total critical care time: 50 minutes Critical care time was exclusive of separately billable procedures and treating other patients. Critical care was necessary to treat or prevent imminent or life-threatening  deterioration. Critical care was time spent personally by me on the following activities: development of treatment plan with patient and/or surrogate as well as nursing, discussions with consultants, evaluation of patient's response to treatment, examination of patient, obtaining history from patient or surrogate, ordering and performing treatments and interventions, ordering and review of laboratory studies, ordering and review of radiographic studies, pulse oximetry and re-evaluation of patient's condition.   Time Spent: 50 minutes  Karmen Bongo M.D. [facility] 02/08/2018, 1:36 PM

## 2018-02-08 NOTE — Progress Notes (Signed)
Called by RN due to low BP into 99P systolic, NTG off and she is receiving IV fluids per DKA protocol.   She tells me she has done this after DKA in past.  We will monitor, she has no chest pain and no SOB alert and oriented.  Will monitor over next hour or so and if BP does not improve will begin levophed.   Discussed with Dr. Adora Fridge.

## 2018-02-08 NOTE — Telephone Encounter (Signed)
LMTCB to schedule appointment °

## 2018-02-08 NOTE — ED Provider Notes (Signed)
[place] EMERGENCY DEPARTMENT Provider Note   CSN: [id number] Arrival date & time: 02/08/18  0343     History   Chief Complaint Chief Complaint  Patient presents with  . Chest Pain  . Hyperglycemia    HPI Jennifer Hogan is a 48 y.o. female.  Patient with history of CAD s/p LAD stent 02/2017, T1DM, ischemic cardiomyopathy, HLD presents with nausea/vomiting TNTC episodes x 2 days. She had onset chest pain and palitations last night that is left sided and radiates "to the diaphram". Pain is intermittent lasting minutes, then returns minutes after it resolves. She is unsure if current pain mimics her previous MI. She has nitro at home but did not take it because she thought her symptoms were caused by dehydration. She endorses diaphoresis.   The history is provided by the patient. No language interpreter was used.  Chest Pain  Associated symptoms: diaphoresis, nausea, shortness of breath and vomiting   Associated symptoms: no fever   Hyperglycemia  Associated symptoms: chest pain, diaphoresis, nausea, shortness of breath and vomiting   Associated symptoms: no fever     Past Medical History:  Diagnosis Date  . Acute sinusitis 08/07/2015  . Allergic state 08/07/2015  . Anxiety   . Coronary artery disease   . Depression   . Diabetes mellitus   . DKA (diabetic ketoacidoses) ([place]) 12/31/2017  . High cholesterol   . Hypothyroidism   . NSTEMI (non-ST elevated myocardial infarction) ([place]) 03/17/2017   s/p DES x 3 LAD, DES OM  . Vomiting and diarrhea 12/31/2017    Patient Active Problem List   Diagnosis Date Noted  . DKA (diabetic ketoacidosis) ([place]) 12/31/2017  . Marijuana abuse 12/31/2017  . Gastroparesis 12/06/2017  . Gastroparesis due to DM ([place]) 03/28/2017  . Coronary artery disease 03/28/2017  . Ischemic cardiomyopathy 03/28/2017  . Pure hypercholesterolemia   . Anemia 03/17/2017  . Allergic state 08/07/2015  . Vitiligo 07/09/2012  . Type 1  diabetes mellitus ([place]) 07/09/2012  . Depression 12/04/2010  . Hypothyroidism 12/04/2010    Past Surgical History:  Procedure Laterality Date  . APPENDECTOMY    . CORONARY STENT INTERVENTION N/A 03/20/2017   Procedure: DES x 3 LAD, DES OM; Surgeon: Burnell Blanks, MD;  Location: [place] [facility];  Service: Cardiovascular;  Laterality: N/A;  . LEFT HEART CATH AND CORONARY ANGIOGRAPHY N/A 03/20/2017   Procedure: LEFT HEART CATH AND CORONARY ANGIOGRAPHY;  Surgeon: Burnell Blanks, MD;  Location: [place] [facility];  Service: Cardiovascular;  Laterality: N/A;     OB History    Gravida  0   Para  0   Term  0   Preterm  0   AB  0   Living  0     SAB  0   TAB  0   Ectopic  0   Multiple  0   Live Births  0            Home Medications    Prior to Admission medications   Medication Sig Start Date End Date Taking? Authorizing Provider  albuterol (PROVENTIL HFA;VENTOLIN HFA) 108 (90 Base) MCG/ACT inhaler Inhale 2 puffs into the lungs every 6 (six) hours as needed for wheezing or shortness of breath. 12/07/17 01/06/18  Damita Lack, MD  aspirin 81 MG EC tablet Take 1 tablet (81 mg total) by mouth daily. 12/07/17   Amin, Jeanella Flattery, MD  atorvastatin (LIPITOR) 80 MG tablet Take 1 tablet (80 mg  total) by mouth at bedtime. 12/07/17   Amin, Jeanella Flattery, MD  Continuous Glucose Monitor Sup (ENLITE GLUCOSE SENSOR) MISC 1 Device by Does not apply route once a week. 10/08/14   Renato Shin, MD  FLUoxetine (PROZAC) 10 MG capsule Take 1 capsule (10 mg total) by mouth daily. 12/07/17 01/06/18  Amin, Jeanella Flattery, MD  FLUoxetine (PROZAC) 40 MG capsule Take 1 capsule (40 mg total) by mouth daily. 12/07/17 01/06/18  Amin, Ankit Chirag, MD  glucose blood (BAYER CONTOUR NEXT TEST) test strip Test blood glucose 2-3 times a day. Dx code: 250.01 08/23/12   Renato Shin, MD  HYDROcodone-acetaminophen [facility]) 5-325 MG tablet Take 1 tablet by mouth every 4 (four) hours as  needed (for pain). 12/07/17   Amin, Jeanella Flattery, MD  insulin aspart (NOVOLOG) 100 UNIT/ML injection 35 units per insulin pump daily 10/31/17   Renato Shin, MD  Insulin Infusion Pump Supplies (MINIMED INFUSION SET-MMT 399) MISC See admin instructions. Use with Humulin insulin - continuous - basal rate 0.625    [provider]  levothyroxine (SYNTHROID) 100 MCG tablet Take 1 tablet (100 mcg total) by mouth daily before breakfast. 12/07/17 01/06/18  Amin, Jeanella Flattery, MD  metoprolol succinate (TOPROL XL) 25 MG 24 hr tablet Take 0.5 tablets (12.5 mg total) by mouth daily. 12/07/17 01/06/18  Amin, Jeanella Flattery, MD  montelukast (SINGULAIR) 10 MG tablet Take 1 tablet (10 mg total) by mouth at bedtime. 12/07/17 01/06/18  Amin, Jeanella Flattery, MD  nitroGLYCERIN (NITROSTAT) 0.4 MG SL tablet Place 1 tablet (0.4 mg total) under the tongue every 5 (five) minutes as needed for chest pain. 12/07/17   Amin, Jeanella Flattery, MD  ondansetron (ZOFRAN) 4 MG tablet Take 1 tablet (4 mg total) by mouth every 8 (eight) hours as needed for nausea or vomiting. 01/02/18   Ghimire, Henreitta Leber, MD  pantoprazole (PROTONIX) 40 MG tablet Take 1 tablet (40 mg total) by mouth daily. 12/07/17   Damita Lack, MD    Family History Family History  Problem Relation Age of Onset  . Coronary artery disease Father   . Congestive Heart Failure Father   . Asthma Father   . Cancer Mother        T cell lymphoma    Social History Social History   Tobacco Use  . Smoking status: Never Smoker  . Smokeless tobacco: Never Used  . Tobacco comment: Use marijuana  Substance Use Topics  . Alcohol use: No    Alcohol/week: 0.0 standard drinks  . Drug use: Yes    Types: Marijuana    Comment: last use last week, occaisonal marijuana use     Allergies   Ciprofloxin hcl [ciprofloxacin] and Food color red [red dye]   Review of Systems Review of Systems  Constitutional: Positive for diaphoresis. Negative for chills and  fever.  HENT: Negative.   Respiratory: Positive for shortness of breath.   Cardiovascular: Positive for chest pain.  Gastrointestinal: Positive for nausea and vomiting.  Musculoskeletal: Negative.   Skin: Negative.   Neurological: Negative.      Physical Exam Updated Vital Signs BP (!) 143/106 (BP Location: Right Arm)   Pulse (!) 119   Temp 98.8 F (37.1 C) (Oral)   Resp 20   Ht 5\' 5"  (1.651 m)   Wt 65.8 kg   SpO2 100%   BMI 24.13 kg/m   Physical Exam Constitutional:      General: She is in acute distress.     Appearance: She is  well-developed.  HENT:     Head: Normocephalic.  Neck:     Musculoskeletal: Normal range of motion and neck supple.  Cardiovascular:     Rate and Rhythm: Regular rhythm. Tachycardia present.  Pulmonary:     Effort: Pulmonary effort is normal.     Breath sounds: Normal breath sounds. No wheezing, rhonchi or rales.  Abdominal:     General: Bowel sounds are normal.     Palpations: Abdomen is soft.     Tenderness: There is abdominal tenderness (upper abdominal tenderness. ). There is no guarding or rebound.  Musculoskeletal: Normal range of motion.     Right lower leg: No edema.     Left lower leg: No edema.  Skin:    General: Skin is warm and dry.     Findings: No rash.  Neurological:     Mental Status: She is alert and oriented to person, place, and time.      ED Treatments / Results  Labs (all labs ordered are listed, but only abnormal results are displayed) Labs Reviewed  BASIC METABOLIC PANEL - Abnormal; Notable for the following components:      Result Value   CO2 19 (*)    Glucose, Bld 349 (*)    Creatinine, Ser 1.07 (*)    Anion gap 19 (*)    All other components within normal limits  CBC - Abnormal; Notable for the following components:   WBC 11.1 (*)    Platelets 580 (*)    All other components within normal limits  I-STAT TROPONIN, ED - Abnormal; Notable for the following components:   Troponin i, poc 0.19 (*)     All other components within normal limits  PROTIME-INR  APTT  TROPONIN I  LIPID PANEL  I-STAT BETA HCG BLOOD, ED (MC, [facility], AP ONLY)  I-STAT BETA HCG BLOOD, ED ([facility], [facility], [facility] ONLY)    EKG EKG Interpretation  Date/Time:  Friday February 08 2018 04:45:37 EST Ventricular Rate:  115 PR Interval:    QRS Duration: 100 QT Interval:  375 QTC Calculation: 519 R Axis:   -54 Text Interpretation:  duplicate, discard Confirmed by Delora Fuel ([id number]) on 02/08/2018 5:01:45 AM   Radiology No results found.  Procedures Procedures (including critical care time) CRITICAL CARE Performed by: Dewaine Oats   Medications Ordered in ED Medications  sodium chloride flush (NS) 0.9 % injection 3 mL (has no administration in time range)  0.9 %  sodium chloride infusion (has no administration in time range)  aspirin chewable tablet 324 mg (has no administration in time range)  heparin injection 3,950 Units (has no administration in time range)  nitroGLYCERIN (NITROSTAT) 0.4 MG SL tablet (has no administration in time range)  morphine 4 MG/ML injection 4 mg (has no administration in time range)  ondansetron (ZOFRAN) injection 4 mg (has no administration in time range)     Initial Impression / Assessment and Plan / ED Course  I have reviewed the triage vital signs and the nursing notes.  Pertinent labs & imaging results that were available during my care of the patient were reviewed by me and considered in my medical decision making (see chart for details).     Patient with history of CAD, MI 02/2017, presents with nausea/vomiting x 2 days, chest pain last night, intermittent, associated with SOB, diaphoresis.  EKG shows ischemic changes. Troponin resulted at 0.19. Code STEMI called. Dr. Roxanne Mins involved in patient care immediately.   Cardiology to ED who will  take patient to the cath lab immediately.  Final Clinical Impressions(s) / ED Diagnoses   Final diagnoses:  Chest pain   1.  STEMI  ED Discharge Orders    None       Charlann Lange, Hershal Coria 97/98/92 1194    Delora Fuel, MD 17/40/81 [phone]

## 2018-02-08 NOTE — Telephone Encounter (Signed)
Received Rx request from [employer]. Per Dr. Loanne Drilling, pt will require appt. Unable to complete without appt. Message routed to scheduling coordinator for scheduling purposes. Documents placed in referrals file at nurses desk for future reference and completion.

## 2018-02-08 NOTE — Significant Event (Signed)
   CBG: 56  Treatment:4oz juice  Symptoms: asymptomatic   Follow-up CBG: Time: 1733 CBG Result:56  Possible Reasons for Event: on insulin drip  Comments/MD notified: Spoke with Dr. Lorin Mercy. Continue with present orders. Insulin drip currently off. 4oz juice given once more. Patient asymptomatic.    Jennifer Hogan      [name]

## 2018-02-08 NOTE — Progress Notes (Signed)
ANTICOAGULATION CONSULT NOTE - Initial Consult  Pharmacy Consult for tirofiban Indication: chest pain/ACS  Allergies  Allergen Reactions  . Ciprofloxin Hcl [Ciprofloxacin] Nausea And Vomiting and Other (See Comments)    Severe migraine  . Food Color Red [Red Dye] Diarrhea    Patient Measurements: Height: 5\' 5"  (165.1 cm) Weight: 145 lb (65.8 kg) IBW/kg (Calculated) : 57 Heparin Dosing Weight: 65.8 kg  Vital Signs: Temp: 98.8 F (37.1 C) (01/17 0405) Temp Source: Oral (01/17 0405) BP: 152/97 (01/17 0832) Pulse Rate: 115 (01/17 0832)  Labs: Recent Labs    02/08/18 0411 02/08/18 0522  HGB 13.7  --   HCT 41.4  --   PLT 580*  --   APTT  --  28  LABPROT  --  13.6  INR  --  1.05  CREATININE 1.07*  --   TROPONINI  --  0.32*    Estimated Creatinine Clearance: 57.2 mL/min (A) (by C-G formula based on SCr of 1.07 mg/dL (H)).   Medical History: Past Medical History:  Diagnosis Date  . Acute sinusitis 08/07/2015  . Allergic state 08/07/2015  . Anxiety   . Coronary artery disease   . Depression   . Diabetes mellitus   . DKA (diabetic ketoacidoses) ([place]) 12/31/2017  . High cholesterol   . Hypothyroidism   . NSTEMI (non-ST elevated myocardial infarction) ([place]) 03/17/2017   s/p DES x 3 LAD, DES OM  . Vomiting and diarrhea 12/31/2017    Medications:  Scheduled:  . aspirin  81 mg Oral Daily  . atorvastatin  80 mg Oral q1800  . [START ON 02/09/2018] clopidogrel  75 mg Oral Q breakfast  . heparin  5,000 Units Subcutaneous Q8H  . insulin aspart  0-9 Units Subcutaneous TID WC  . metoprolol tartrate  25 mg Oral BID  . sodium chloride flush  3 mL Intravenous Once  . sodium chloride flush  3 mL Intravenous Q12H   . sodium chloride 500 mL (02/08/18 3419)  . sodium chloride    . sodium chloride    . nitroGLYCERIN 5 mcg/min (02/08/18 0745)  . tirofiban      Assessment: 48 yo female admitted as code STEMI, now s/p cath lab w/ PCI to proximal/ostial circumflex.  Pharmacy  asked to continue tirofiban x 12 hrs.  Goal of Therapy:  Monitor platelets by anticoagulation protocol: Yes   Plan:  Continue tirofiban 0.15 mcg/kg/min x 12 hrs. Monitor CBC.  Marguerite Olea, [facility] Clinical Pharmacist Phone [phone]  02/08/2018 8:48 AM

## 2018-02-08 NOTE — CV Procedure (Addendum)
   Acute coronary syndrome with diffuse hyperacute T waves, ongoing chest pain, and mildly elevated troponin and a type I diabetic with history of multiple prior LAD stents placed in February 2019.  Ultrasound guidance for coronary angiography via right radial approach.  Angiography demonstrates patent left main, proximal and distal LAD stent patent with mid vessel 75% stenosis between the 2 stents.  Large first diagonal with new ostial 70% stenosis.  Mid diagonal stent contains distal in-stent restenosis up to 50% and beyond the stented segment there is segmental 80% stenosis.  Relatively small circumflex contains an 95% ostial to proximal stenosis followed by 50% eccentric stenosis in the mid vessel.  The right coronary is large and widely patent.  Multiple lesions make identifying the culprit for her ongoing symptoms difficult.  Chose to treat the proximal/ostial circumflex with 2.5 x 15 Onyx postdilated to 2.75 mm in diameter.  LVEF 50%.  EDP less than 7 meters mercury.  No early complications.

## 2018-02-08 NOTE — H&P (Signed)
Cardiology History & Physical    Patient ID: [name] MRN: [id number], DOB: Dec 21, 1970 Date of Encounter: 02/08/2018, 6:03 AM Primary Physician: [name], NP  Chief Complaint: Chest pain   HPI: Jennifer Hogan is a 48 y.o. female with history of coronary artery disease status post NSTEMI and PCI to large ramus and LAD, and diabetes, who presents with chest pain.  Pt had been feeling nonspecifically unwell over the last day, and thought she may have been coming down with the flu.  She was awoken from sleep this morning with severe substernal CP, and significant nausea.  She presented to the [facility] ED, where she was tachycardic and mildly hypertensive.  Initial ECG showed ST elevation in aVR, diffuse hyperacute T waves across the precordium and ST elevation in aVL.  Given acute chest pain and diffuse ischemic ECG changes, the cath lab was activated.  Full dose ASA, Zofran, IV morphine, and 4000 units heparin were administered in the ED.  She continued to have CP despite the above interventions and 3 sublingual nitro.  Past Medical History:  Diagnosis Date  . Acute sinusitis 08/07/2015  . Allergic state 08/07/2015  . Anxiety   . Coronary artery disease   . Depression   . Diabetes mellitus   . DKA (diabetic ketoacidoses) ([place]) 12/31/2017  . High cholesterol   . Hypothyroidism   . NSTEMI (non-ST elevated myocardial infarction) ([place]) 03/17/2017   s/p DES x 3 LAD, DES OM  . Vomiting and diarrhea 12/31/2017     Surgical History:  Past Surgical History:  Procedure Laterality Date  . APPENDECTOMY    . CORONARY STENT INTERVENTION N/A 03/20/2017   Procedure: DES x 3 LAD, DES OM; Surgeon: Burnell Blanks, MD;  Location: [place] [facility];  Service: Cardiovascular;  Laterality: N/A;  . LEFT HEART CATH AND CORONARY ANGIOGRAPHY N/A 03/20/2017   Procedure: LEFT HEART CATH AND CORONARY ANGIOGRAPHY;  Surgeon: Burnell Blanks, MD;  Location: [place] [facility];  Service:  Cardiovascular;  Laterality: N/A;     Home Meds: Prior to Admission medications   Medication Sig Start Date End Date Taking? Authorizing Provider  albuterol (PROVENTIL HFA;VENTOLIN HFA) 108 (90 Base) MCG/ACT inhaler Inhale 2 puffs into the lungs every 6 (six) hours as needed for wheezing or shortness of breath. 12/07/17 01/06/18  Damita Lack, MD  aspirin 81 MG EC tablet Take 1 tablet (81 mg total) by mouth daily. 12/07/17   Amin, Jeanella Flattery, MD  atorvastatin (LIPITOR) 80 MG tablet Take 1 tablet (80 mg total) by mouth at bedtime. 12/07/17   Amin, Jeanella Flattery, MD  Continuous Glucose Monitor Sup (ENLITE GLUCOSE SENSOR) MISC 1 Device by Does not apply route once a week. 10/08/14   Renato Shin, MD  FLUoxetine (PROZAC) 10 MG capsule Take 1 capsule (10 mg total) by mouth daily. 12/07/17 01/06/18  Amin, Jeanella Flattery, MD  FLUoxetine (PROZAC) 40 MG capsule Take 1 capsule (40 mg total) by mouth daily. 12/07/17 01/06/18  Amin, Ankit Chirag, MD  glucose blood (BAYER CONTOUR NEXT TEST) test strip Test blood glucose 2-3 times a day. Dx code: 250.01 08/23/12   Renato Shin, MD  HYDROcodone-acetaminophen [facility]) 5-325 MG tablet Take 1 tablet by mouth every 4 (four) hours as needed (for pain). 12/07/17   Amin, Jeanella Flattery, MD  insulin aspart (NOVOLOG) 100 UNIT/ML injection 35 units per insulin pump daily 10/31/17   Renato Shin, MD  Insulin Infusion Pump Supplies (MINIMED INFUSION SET-MMT 399)  MISC See admin instructions. Use with Humulin insulin - continuous - basal rate 0.625    [provider]  levothyroxine (SYNTHROID) 100 MCG tablet Take 1 tablet (100 mcg total) by mouth daily before breakfast. 12/07/17 01/06/18  Amin, Jeanella Flattery, MD  metoprolol succinate (TOPROL XL) 25 MG 24 hr tablet Take 0.5 tablets (12.5 mg total) by mouth daily. 12/07/17 01/06/18  Amin, Jeanella Flattery, MD  montelukast (SINGULAIR) 10 MG tablet Take 1 tablet (10 mg total) by mouth at bedtime. 12/07/17 01/06/18  Amin,  Jeanella Flattery, MD  nitroGLYCERIN (NITROSTAT) 0.4 MG SL tablet Place 1 tablet (0.4 mg total) under the tongue every 5 (five) minutes as needed for chest pain. 12/07/17   Amin, Jeanella Flattery, MD  ondansetron (ZOFRAN) 4 MG tablet Take 1 tablet (4 mg total) by mouth every 8 (eight) hours as needed for nausea or vomiting. 01/02/18   Ghimire, Henreitta Leber, MD  pantoprazole (PROTONIX) 40 MG tablet Take 1 tablet (40 mg total) by mouth daily. 12/07/17   Damita Lack, MD    Allergies:  Allergies  Allergen Reactions  . Ciprofloxin Hcl [Ciprofloxacin] Nausea And Vomiting and Other (See Comments)    Severe migraine  . Food Color Red [Red Dye] Diarrhea    Social History   Socioeconomic History  . Marital status: Divorced    Spouse name: Not on file  . Number of children: Not on file  . Years of education: Not on file  . Highest education level: Not on file  Occupational History  . 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 Financial resource strain: Somewhat hard  . Food insecurity:    Worry: Never true    Inability: Never true  . Transportation needs:    Medical: No    Non-medical: No  Tobacco Use  . Smoking status: Never Smoker  . Smokeless tobacco: Never Used  . Tobacco comment: Use marijuana  Substance and Sexual Activity  . Alcohol use: No    Alcohol/week: 0.0 standard drinks  . Drug use: Yes    Types: Marijuana    Comment: last use last week, occaisonal marijuana use  . Sexual activity: Not Currently    Birth control/protection: None  Lifestyle  . Physical activity:    Days per week: Not on file    Minutes per session: Not on file  . Stress: Not on file  Relationships  . Social connections:    Talks on phone: Patient refused    Gets together: Patient refused    Attends religious service: Patient refused    Active member of club or organization: Patient refused    Attends meetings of clubs or organizations: Patient refused    Relationship status: Patient  refused  . Intimate partner violence:    Fear of current or ex partner: No    Emotionally abused: No    Physically abused: No    Forced sexual activity: No  Other Topics Concern  . Not on file  Social History Narrative  . Not on file     Family History  Problem Relation Age of Onset  . Coronary artery disease Father   . Congestive Heart Failure Father   . Asthma Father   . Cancer Mother        T cell lymphoma    Review of Systems: All other systems reviewed and are otherwise negative except as noted above.  Labs:   Lab Results  Component Value Date   WBC 11.1 (  H) 02/08/2018   HGB 13.7 02/08/2018   HCT 41.4 02/08/2018   MCV 91.0 02/08/2018   PLT 580 (H) 02/08/2018    Recent Labs  Lab 02/08/18 0411  NA 136  K 4.3  CL 98  CO2 19*  BUN 16  CREATININE 1.07*  CALCIUM 9.9  GLUCOSE 349*   No results for input(s): CKTOTAL, CKMB, TROPONINI in the last 72 hours. Lab Results  Component Value Date   CHOL 290 (H) 02/08/2018   HDL 92 02/08/2018   LDLCALC 177 (H) 02/08/2018   TRIG 107 02/08/2018   No results found for: DDIMER  Radiology/Studies:  Dg Chest Port 1 View  Result Date: 02/08/2018 CLINICAL DATA:  Chest pain EXAM: PORTABLE CHEST 1 VIEW COMPARISON:  03/15/2017 FINDINGS: Normal heart size and mediastinal contours. Coronary stents. There is no edema, consolidation, effusion, or pneumothorax. Midthoracic degenerative disc narrowing. IMPRESSION: No evidence of active disease. Electronically Signed   By: Monte Fantasia M.D.   On: 02/08/2018 05:09   Wt Readings from Last 3 Encounters:  02/08/18 65.8 kg  12/31/17 61.2 kg  12/03/17 65.8 kg    EKG: Sinus tachycardia,  ST elevation in aVR, diffuse hyperacute T waves across the precordium and ST elevation in aVL.  Physical Exam: Blood pressure 133/88, pulse (!) 126, temperature 98.8 F (37.1 C), temperature source Oral, resp. rate 12, height 5\' 5"  (1.651 m), weight 65.8 kg, SpO2 100 %. Body mass index is 24.13  kg/m. General: In acute discomfort Head: Normocephalic, atraumatic, sclera non-icteric, no xanthomas, nares are without discharge.  Neck: Negative for carotid bruits. JVD not elevated. Lungs: Clear bilaterally to auscultation without wheezes, rales, or rhonchi. Breathing is unlabored. Heart: Tachycardic, regular with S1 S2. No murmurs, rubs, or gallops appreciated. Abdomen: Soft, non-tender, non-distended with normoactive bowel sounds. No hepatomegaly. No rebound/guarding. No obvious abdominal masses. Msk:  Strength and tone appear normal for age. Extremities: No clubbing or cyanosis. No edema.  Distal pedal pulses are 2+ and equal bilaterally. Neuro: Alert and oriented X 3. No focal deficit. No facial asymmetry. Moves all extremities spontaneously. Psych:  Responds to questions appropriately with a normal affect.    Assessment and Plan  27F with history of CAD and type 1 diabetes, who presents with acute CP and diffuse ischemic ECG changes.  Her POC troponin was positive as well.  She appears to be having an acute event that may be involving both the LAD and large ramus.  We will take her emergently to the catheterization lab for [facility] and possible PCI.   Signed, Doylene Canning, MD 02/08/2018, 6:03 AM

## 2018-02-08 NOTE — ED Notes (Signed)
RN Mitzi Hansen informed of Troponin results .19. Dr Dayna Barker called but no answer

## 2018-02-08 NOTE — Care Management Note (Signed)
Case Management Note  Patient Details  Name: TORRIN FREIN MRN: [id number] Date of Birth: 03-08-1970  Subjective/Objective: 48 yo female presented with an acute NSTEMI with emergent stent placement.                    Action/Plan: CM following for dispositional needs. Patient lives at home alone, independent with ADLs with no DME in use. Patient indicated to CM during last admission as having her Doctorate In Chemistry and her being in-between jobs  with no health insurance. PCP: [name] FNP ([facility]); pharmacy of choice: [facility] for discounted Rxs. CM team will continue to follow for needs.   Expected Discharge Date:                  Expected Discharge Plan:  Home/Self Care  In-House Referral:  NA  Discharge planning Services  CM Consult  Post Acute Care Choice:  NA Choice offered to:  NA  DME Arranged:  N/A DME Agency:  NA  HH Arranged:  NA HH Agency:  NA  Status of Service:  In process, will continue to follow  If discussed at Long Length of Stay Meetings, dates discussed:    Additional Comments:  Midge Minium RN, BSN, NCM-BC, ACM-RN [phone] 02/08/2018, 12:02 PM

## 2018-02-08 NOTE — ED Notes (Signed)
Activated code stemi @ 5:19 am

## 2018-02-08 NOTE — ED Triage Notes (Signed)
Pt reports she began having bloodsugar readings yesterday, she attempted to address them w/ insulin, however they continued to rise.  Pt is now having chest pain and feels like her heart is racing.

## 2018-02-08 NOTE — Progress Notes (Signed)
Inpatient Diabetes Program Recommendations  AACE/ADA: New Consensus Statement on Inpatient Glycemic Control (2015)  Target Ranges:  Prepandial:   less than 140 mg/dL      Peak postprandial:   less than 180 mg/dL (1-2 hours)      Critically ill patients:  140 - 180 mg/dL   Lab Results  Component Value Date   GLUCAP 427 (H) 02/08/2018   HGBA1C 9.7 (H) 12/07/2017    Review of Glycemic Control  Inpatient Diabetes Program Recommendations:   Spoke with RN Meda Klinefelter and discussed patient's hyperglycemia on insulin pump. -Start on IV insulin DKA protocol which includes BMET Patient will be able to go back on insulin pump after CBGs improved and labs WNL.   Thank you, Nani Gasser. Willadeen Colantuono, RN, MSN, CDE  Diabetes Coordinator Inpatient Glycemic Control Team Team Pager [phone] (8am-5pm) 02/08/2018 9:06 AM

## 2018-02-08 NOTE — Progress Notes (Addendum)
Progress Note  Patient Name: Jennifer Hogan Date of Encounter: 02/08/2018  Primary Cardiologist: Jennifer Him, MD   Subjective   Pt now in CCU HR 115, she is feeling better.  Jennifer Hogan is now following for her DM-1 and DKA.    Currently resting on her side.  No chest pain or SOB.  HR was 120s when I arrived but with rest slowing to 110.      Inpatient Medications    Scheduled Meds: . aspirin  81 mg Oral Daily  . atorvastatin  80 mg Oral q1800  . [START ON 02/09/2018] clopidogrel  75 mg Oral Q breakfast  . heparin  5,000 Units Subcutaneous Q8H  . insulin aspart  0-9 Units Subcutaneous TID WC  . metoprolol tartrate  25 mg Oral BID  . sodium chloride flush  3 mL Intravenous Once  . sodium chloride flush  3 mL Intravenous Q12H   Continuous Infusions: . sodium chloride 500 mL (02/08/18 3790)  . sodium chloride    . sodium chloride    . nitroGLYCERIN 5 mcg/min (02/08/18 0745)  . nitroGLYCERIN    . tirofiban     PRN Meds: sodium chloride, acetaminophen, hydrALAZINE, labetalol, ondansetron (ZOFRAN) IV, oxyCODONE, sodium chloride flush   Vital Signs    Vitals:   02/08/18 0731 02/08/18 0736 02/08/18 0741 02/08/18 0832  BP: 131/87 131/81 137/87 (!) 152/97  Pulse: (!) 110 (!) 108 (!) 173 (!) 115  Resp: 15 17 (!) 0   Temp:      TempSrc:      SpO2: 96% (!) 0%    Weight:      Height:        Intake/Output Summary (Last 24 hours) at 02/08/2018 0923 Last data filed at 02/08/2018 0830 Gross per 24 hour  Intake -  Output 700 ml  Net -700 ml   Last 3 Weights 02/08/2018 12/31/2017 12/03/2017  Weight (lbs) 145 lb 135 lb 145 lb  Weight (kg) 65.772 kg 61.236 kg 65.772 kg      Telemetry    ST  - Personally Reviewed  ECG    Post procedure with ST 111 and ST depression inf.  - Personally Reviewed  Physical Exam   GEN: No acute distress.   Neck: No JVD Cardiac: RRR, no murmurs, rubs, or gallops.  Respiratory: Clear to auscultation bilaterally. GI: Soft, nontender,  non-distended  MS: No edema; No deformity. Neuro:  Nonfocal  Psych: Normal affect   Labs    Chemistry Recent Labs  Lab 02/08/18 0411  NA 136  K 4.3  CL 98  CO2 19*  GLUCOSE 349*  BUN 16  CREATININE 1.07*  CALCIUM 9.9  GFRNONAA >60  GFRAA >60  ANIONGAP 19*     Hematology Recent Labs  Lab 02/08/18 0411  WBC 11.1*  RBC 4.55  HGB 13.7  HCT 41.4  MCV 91.0  MCH 30.1  MCHC 33.1  RDW 13.7  PLT 580*    Cardiac Enzymes Recent Labs  Lab 02/08/18 0522  TROPONINI 0.32*    Recent Labs  Lab 02/08/18 0416  TROPIPOC 0.19*     BNPNo results for input(s): BNP, PROBNP in the last 168 hours.   DDimer No results for input(s): DDIMER in the last 168 hours.   Radiology    Dg Chest Port 1 View  Result Date: 02/08/2018 CLINICAL DATA:  Chest pain EXAM: PORTABLE CHEST 1 VIEW COMPARISON:  03/15/2017 FINDINGS: Normal heart size and mediastinal contours. Coronary stents. There is no  edema, consolidation, effusion, or pneumothorax. Midthoracic degenerative disc narrowing. IMPRESSION: No evidence of active disease. Electronically Signed   By: Jennifer Hogan M.D.   On: 02/08/2018 05:09    Cardiac Studies   Cardiac cath 02/08/18  Previously placed Dist LM to Ost LAD drug eluting stent is widely patent.  Balloon angioplasty was performed.  Dist LAD lesion is 99% stenosed.  Balloon angioplasty was performed.  Previously placed Prox LAD drug eluting stent is widely patent.  Balloon angioplasty was performed.  Previously placed Mid LAD-1 drug eluting stent is widely patent.  Balloon angioplasty was performed.  Balloon angioplasty was performed.  Prox Cx lesion is 95% stenosed.  Mid Cx lesion is 60% stenosed.  1st Mrg lesion is 70% stenosed.  Prox LAD to Mid LAD lesion is 70% stenosed.  Mid LAD-2 lesion is 40% stenosed.  Ost 1st Mrg to 1st Mrg lesion is 35% stenosed.  Ost 1st Mrg lesion is 70% stenosed.  A stent was successfully placed.  Post intervention,  there is a 0% residual stenosis.   Diagnostic  Dominance: Right    Intervention       Patient Profile     48 y.o. female with hx of CAD and prior NSTEMI and PCI to large ramus and LAD, DM-1 on insulin pump, EF 45-50% on last echo, hx of orthostatic hypotension.  She has been on ASA and plavix.   Assessment & Plan    Acute NSTEMI with emergent stent to LCX  pk troponin 0.32 currently on NTG at 5 mcg BP 858 systolic.  No pain will wean tomorrow and add imdur for her residual disease.  Defer to Jennifer Hogan.  If continues to do well may be able to discharge over weekend.   On BB 25 BID.   CAD with severe 3 vessel ASCAD s/p NSTEMIwith cath showing severe three-vessel CAD status post PTCA DES x3 in the LAD(ostial DES placed, mid DES placed, distal DES placed)andPTCA/DES x 1 intermediate branch, 02/2017.   HLD on lipitor 80 pre-hospital.but stopped due to intolerance.  LDL today is 177. Needs Repatha    Hypothyroid on synthroid, check TSH   IDDM-1 on insulin pump with DKA - IM is following. Stopping insulin pump now.    For questions or updates, please contact Jennifer Hogan Please consult [email or web address] for contact info under        Signed, Jennifer Kicks, NP  02/08/2018, 9:23 AM     Agree with note written by Jennifer Hogan Hogan  Jennifer Hogan is status post circumflex PCI and drug-eluting stenting by Jennifer Hogan early this morning.  She no longer has chest pain.  She does have moderate residual disease in her mid circumflex, ostial and mid ramus branch and mid LAD with patent stents otherwise in normal LV function.  Her insulin pump was turned off and she was placed on IV insulin drip by internal medicine for potential DKA.  She is statin intolerant we will entertain beginning [place] with an LDL of 177.  At this point, I recommend continued medical treatment of her residual CAD.   Jennifer Hogan 02/08/2018 10:38 AM

## 2018-02-09 ENCOUNTER — Other Ambulatory Visit: Payer: Self-pay

## 2018-02-09 ENCOUNTER — Encounter (HOSPITAL_COMMUNITY): Payer: Self-pay | Admitting: Interventional Cardiology

## 2018-02-09 DIAGNOSIS — E1010 Type 1 diabetes mellitus with ketoacidosis without coma: Secondary | ICD-10-CM

## 2018-02-09 DIAGNOSIS — I2102 ST elevation (STEMI) myocardial infarction involving left anterior descending coronary artery: Secondary | ICD-10-CM

## 2018-02-09 LAB — GLUCOSE, CAPILLARY
GLUCOSE-CAPILLARY: 169 mg/dL — AB (ref 70–99)
GLUCOSE-CAPILLARY: 163 mg/dL — AB (ref 70–99)
Glucose-Capillary: 106 mg/dL — ABNORMAL HIGH (ref 70–99)
Glucose-Capillary: 121 mg/dL — ABNORMAL HIGH (ref 70–99)
Glucose-Capillary: 129 mg/dL — ABNORMAL HIGH (ref 70–99)
Glucose-Capillary: 138 mg/dL — ABNORMAL HIGH (ref 70–99)
Glucose-Capillary: 140 mg/dL — ABNORMAL HIGH (ref 70–99)
Glucose-Capillary: 141 mg/dL — ABNORMAL HIGH (ref 70–99)
Glucose-Capillary: 148 mg/dL — ABNORMAL HIGH (ref 70–99)
Glucose-Capillary: 153 mg/dL — ABNORMAL HIGH (ref 70–99)
Glucose-Capillary: 157 mg/dL — ABNORMAL HIGH (ref 70–99)
Glucose-Capillary: 160 mg/dL — ABNORMAL HIGH (ref 70–99)
Glucose-Capillary: 170 mg/dL — ABNORMAL HIGH (ref 70–99)
Glucose-Capillary: 184 mg/dL — ABNORMAL HIGH (ref 70–99)
Glucose-Capillary: 186 mg/dL — ABNORMAL HIGH (ref 70–99)
Glucose-Capillary: 206 mg/dL — ABNORMAL HIGH (ref 70–99)
Glucose-Capillary: 210 mg/dL — ABNORMAL HIGH (ref 70–99)
Glucose-Capillary: 220 mg/dL — ABNORMAL HIGH (ref 70–99)
Glucose-Capillary: 225 mg/dL — ABNORMAL HIGH (ref 70–99)
Glucose-Capillary: 229 mg/dL — ABNORMAL HIGH (ref 70–99)
Glucose-Capillary: 242 mg/dL — ABNORMAL HIGH (ref 70–99)

## 2018-02-09 LAB — CBC
HCT: 34 % — ABNORMAL LOW (ref 36.0–46.0)
Hemoglobin: 10.9 g/dL — ABNORMAL LOW (ref 12.0–15.0)
MCH: 29.8 pg (ref 26.0–34.0)
MCHC: 32.1 g/dL (ref 30.0–36.0)
MCV: 92.9 fL (ref 80.0–100.0)
Platelets: 442 10*3/uL — ABNORMAL HIGH (ref 150–400)
RBC: 3.66 MIL/uL — ABNORMAL LOW (ref 3.87–5.11)
RDW: 14.6 % (ref 11.5–15.5)
WBC: 22.7 10*3/uL — ABNORMAL HIGH (ref 4.0–10.5)
nRBC: 0 % (ref 0.0–0.2)

## 2018-02-09 LAB — MAGNESIUM
Magnesium: 2.2 mg/dL (ref 1.7–2.4)
Magnesium: 2.2 mg/dL (ref 1.7–2.4)
Magnesium: 2.2 mg/dL (ref 1.7–2.4)
Magnesium: 2 mg/dL (ref 1.7–2.4)
Magnesium: 2 mg/dL (ref 1.7–2.4)

## 2018-02-09 LAB — BASIC METABOLIC PANEL
Anion gap: 7 (ref 5–15)
Anion gap: 16 — ABNORMAL HIGH (ref 5–15)
Anion gap: 8 (ref 5–15)
Anion gap: 9 (ref 5–15)
BUN: 12 mg/dL (ref 6–20)
BUN: 11 mg/dL (ref 6–20)
BUN: 8 mg/dL (ref 6–20)
BUN: 5 mg/dL — ABNORMAL LOW (ref 6–20)
CALCIUM: 7.9 mg/dL — AB (ref 8.9–10.3)
CHLORIDE: 112 mmol/L — AB (ref 98–111)
CO2: 18 mmol/L — ABNORMAL LOW (ref 22–32)
CO2: 11 mmol/L — ABNORMAL LOW (ref 22–32)
CO2: 16 mmol/L — ABNORMAL LOW (ref 22–32)
CO2: 19 mmol/L — AB (ref 22–32)
CREATININE: 0.88 mg/dL (ref 0.44–1.00)
Calcium: 8.2 mg/dL — ABNORMAL LOW (ref 8.9–10.3)
Calcium: 7.7 mg/dL — ABNORMAL LOW (ref 8.9–10.3)
Calcium: 7.9 mg/dL — ABNORMAL LOW (ref 8.9–10.3)
Chloride: 109 mmol/L (ref 98–111)
Chloride: 111 mmol/L (ref 98–111)
Chloride: 108 mmol/L (ref 98–111)
Creatinine, Ser: 0.79 mg/dL (ref 0.44–1.00)
Creatinine, Ser: 0.73 mg/dL (ref 0.44–1.00)
Creatinine, Ser: 0.6 mg/dL (ref 0.44–1.00)
GFR calc Af Amer: >60 mL/min (ref >60)
GFR calc Af Amer: >60 mL/min (ref >60)
GFR calc Af Amer: >60 mL/min (ref >60)
GFR calc non Af Amer: >60 mL/min (ref >60)
GFR calc non Af Amer: >60 mL/min (ref >60)
GFR calc non Af Amer: >60 mL/min (ref >60)
Glucose, Bld: 145 mg/dL — ABNORMAL HIGH (ref 70–99)
Glucose, Bld: 194 mg/dL — ABNORMAL HIGH (ref 70–99)
Glucose, Bld: 159 mg/dL — ABNORMAL HIGH (ref 70–99)
Glucose, Bld: 161 mg/dL — ABNORMAL HIGH (ref 70–99)
Potassium: 3.5 mmol/L (ref 3.5–5.1)
Potassium: 4 mmol/L (ref 3.5–5.1)
Potassium: 4.1 mmol/L (ref 3.5–5.1)
Potassium: 3.5 mmol/L (ref 3.5–5.1)
Sodium: 137 mmol/L (ref 135–145)
Sodium: 136 mmol/L (ref 135–145)
Sodium: 135 mmol/L (ref 135–145)
Sodium: 136 mmol/L (ref 135–145)

## 2018-02-09 LAB — TROPONIN I
Troponin I: 0.59 ng/mL (ref <0.03)
Troponin I: 0.47 ng/mL (ref <0.03)

## 2018-02-09 MED ORDER — INSULIN PUMP
SUBCUTANEOUS | Status: DC
  Administered 2018-02-09 (×2): via SUBCUTANEOUS
  Administered 2018-02-10: 2.1 via SUBCUTANEOUS
  Administered 2018-02-10: 0.6 via SUBCUTANEOUS
  Administered 2018-02-10 (×4): via SUBCUTANEOUS
  Filled 2018-02-09: qty 1

## 2018-02-09 MED ORDER — LEVALBUTEROL HCL 0.63 MG/3ML IN NEBU: 0.6300 mg | INHALATION_SOLUTION | Freq: Four times a day (QID) | RESPIRATORY_TRACT | Status: DC | PRN

## 2018-02-09 MED ORDER — LEVALBUTEROL HCL 0.63 MG/3ML IN NEBU
0.6300 mg | INHALATION_SOLUTION | Freq: Four times a day (QID) | RESPIRATORY_TRACT | Status: DC
  Administered 2018-02-09 (×2): 0.63 mg via RESPIRATORY_TRACT
  Filled 2018-02-09 (×2): qty 3

## 2018-02-09 MED ORDER — SODIUM CHLORIDE 0.9 % IV BOLUS
1000.0000 mL | Freq: Once | INTRAVENOUS | Status: AC
  Administered 2018-02-09: 1000 mL via INTRAVENOUS

## 2018-02-09 MED ORDER — SODIUM CHLORIDE 0.9 % IV SOLN
INTRAVENOUS | Status: DC | PRN
  Administered 2018-02-09 (×2): via INTRAVENOUS

## 2018-02-09 MED ORDER — ISOSORBIDE MONONITRATE ER 30 MG PO TB24
15.0000 mg | ORAL_TABLET | Freq: Every day | ORAL | Status: DC
  Administered 2018-02-09 – 2018-02-11 (×3): 15 mg via ORAL
  Filled 2018-02-09 (×3): qty 1

## 2018-02-09 MED ORDER — DEXTROSE-NACL 5-0.45 % IV SOLN
INTRAVENOUS | Status: AC
  Administered 2018-02-09: 11:00:00 via INTRAVENOUS
  Administered 2018-02-09: 425 mL via INTRAVENOUS

## 2018-02-09 MED ORDER — ACETAMINOPHEN 325 MG PO TABS: 650.0000 mg | ORAL_TABLET | Freq: Four times a day (QID) | ORAL | Status: DC | PRN

## 2018-02-09 MED ORDER — SODIUM CHLORIDE 0.9 % IV SOLN: INTRAVENOUS | Status: DC

## 2018-02-09 NOTE — Progress Notes (Signed)
Inpatient Diabetes Program Recommendations  AACE/ADA: New Consensus Statement on Inpatient Glycemic Control   Target Ranges:  Prepandial:   less than 140 mg/dL      Peak postprandial:   less than 180 mg/dL (1-2 hours)      Critically ill patients:  140 - 180 mg/dL   Results for Jennifer Hogan, Jennifer Hogan (MRN [id number]) as of 02/09/2018 11:01  Ref. Range 02/09/2018 00:02 02/09/2018 01:06 02/09/2018 02:05 02/09/2018 03:08 02/09/2018 04:07 02/09/2018 05:07 02/09/2018 06:12 02/09/2018 07:21 02/09/2018 08:26 02/09/2018 09:30 02/09/2018 10:32  Glucose-Capillary Latest Ref Range: 70 - 99 mg/dL 106 (H) 170 (H) 210 (H) 206 (H) 138 (H) 121 (H) 148 (H) 169 (H) 184 (H) 163 (H) 141 (H)  Results for Jennifer Hogan, Jennifer Hogan (MRN [id number]) as of 02/09/2018 11:01  Ref. Range 02/08/2018 04:11  Glucose Latest Ref Range: 70 - 99 mg/dL 349 (H)  Results for Jennifer Hogan, Jennifer Hogan (MRN [id number]) as of 02/09/2018 11:01  Ref. Range 06/13/2017 08:33 12/07/2017 06:01  Hemoglobin A1C Latest Ref Range: 4.8 - 5.6 % 10.8 (A) 9.7 (H)   Review of Glycemic Control  Diabetes history: DM1 (makes NO insulin; requires basal, correction, and carbohydrate insulin) Outpatient Diabetes medications: Insulin Pump with Humalog Current orders for Inpatient glycemic control: IV insulin drip  NOTE: Noted consult for Diabetes Coordinator. Diabetes Coordinator is not on campus over the weekend but available by pager from 8am to 5pm for questions or concerns. Chart reviewed. Noted patient was recently inpatient 12/31/17 to 01/02/18 and was seen by Diabetes Coordinator on 01/01/18. Per progress note by S. Theda Sers, RN, Diabetes Coordinator patient is followed by Dr. Loanne Drilling, uses a [employer]tronic insulin pump and the following should be patient's insulin pump settings:  Basal insulin 0.65 units/hour Total daily basal insulin: 15.6 units/24 hours  Carb Coverage 1:18     1 unit for every 18 grams of carbohydrates  Insulin Sensitivity (or correction bolus) 1:60     1  unit for each 60 by which glucose exceeds 100 mg/dl  Patient is currently on IV insulin drip and per MD progress note today, patient will be transitioning back to her insulin pump once DKA is cleared (as determined by MD).  Will follow along.  Thanks, Barnie Alderman, RN, MSN, CDE Diabetes Coordinator Inpatient Diabetes Program [phone] (Team Pager from 8am to 5pm)

## 2018-02-09 NOTE — Consult Note (Signed)
Jennifer Hogan DOB: 08-27-1970 DOA: 02/08/2018 PCP: [name], NP  Admit HPI / Brief Narrative: 48 y.o. female w/ a hx of CAD s/p stents x 3 in 2/19; hypothyroidism; HLD; DM a on an insulin pump; and depression who was admitted w/ a STEMI, and was found to be in DKA as well.    HPI/Subjective: The patient reports that she still does not feel well in general.  She denies active chest pain shortness of breath or headache.  She does report ongoing nausea with some low-grade vomiting and diffuse crampy abdominal pain.  Recommendations/Plan:  DKA in Poorly controlled DM1 admitted for DKA 12/9-11/2017 - A1c on 11/15 was 9.7 - plan to resume insulin pump when she transitions off DKA -presently labs are consistent with a persisting DKA -plan to remain on insulin drip until anion gap completely closed and bicarb 18 or higher  ACS -Patient presented with fairly nonspecific symptoms but dynamic EKG changes and positive troponin concerning for STEMI -She was taken urgently to the cath lab and required angioplasty as well as stent placement -She had diffuse disease and will need aggressive medical management -care per Cardiology   Hypothyroidism -Normal TSHand free T4 on 12/31/17 and normal TSH today -Continue Synthroid at home dose   HLD -Per cardiology, she stopped taking her Lipitor as an outpatient due to intolerance -They appear to plan to rx Repatha -She was given Crestor for now  Depression -Continue Paxil  Code Status: FULL Family Communication: no family present at time of exam  Antibiotics: None  DVT prophylaxis: Subcutaneous heparin  Objective: Blood pressure (!) 150/85, pulse (!) 110, temperature 99.2 F (37.3 C), temperature source Oral, resp. rate (!) 25, height 5\' 5"  (1.651 m), weight 65.8 kg, SpO2 92 %.  Intake/Output Summary (Last 24 hours) at 02/09/2018 0839 Last data filed at  02/09/2018 0600 Gross per 24 hour  Intake 6514.25 ml  Output 1300 ml  Net 5214.25 ml     Exam: General: No acute respiratory distress Lungs: Clear to auscultation bilaterally without wheezes or crackles Cardiovascular: Tachycardic but regular with no murmur or rub Abdomen: Mildly tender diffusely, nondistended, soft, bowel sounds positive, no rebound, no ascites, no appreciable mass Extremities: No significant cyanosis, clubbing, or edema bilateral lower extremities  Data Reviewed: Basic Metabolic Panel: Recent Labs  Lab 02/08/18 1516 02/08/18 1901 02/08/18 2122 02/08/18 2308 02/09/18 0221 02/09/18 0646  NA 137 134* 134* 137 136  --   K 3.9 6.2* 3.8 3.5 4.0  --   CL 113* 108 110 112* 109  --   CO2 14* 13* 13* 18* 11*  --   GLUCOSE 133* 218* 277* 145* 194*  --   BUN 16 14 13 12 11   --   CREATININE 1.04* 0.88 0.94 0.88 0.79  --   CALCIUM 7.9* 8.1* 8.0* 8.2* 7.9*  --   MG 2.2 2.5*  --  2.2 2.2 2.2    CBC: Recent Labs  Lab 02/08/18 0411 02/08/18 1247 02/09/18 0221  WBC 11.1*  --  22.7*  HGB 13.7  --  10.9*  HCT 41.4  --  34.0*  MCV 91.0  --  92.9  PLT 580* 420* 442*    Liver Function Tests: No results for input(s): AST, ALT, ALKPHOS, BILITOT, PROT, ALBUMIN in the last 168 hours. No results for input(s): LIPASE, AMYLASE in the last 168 hours. No results for input(s): AMMONIA in the last  168 hours.  Coags: Recent Labs  Lab 02/08/18 0522  INR 1.05   Recent Labs  Lab 02/08/18 0522  APTT 28    Cardiac Enzymes: Recent Labs  Lab 02/08/18 0522 02/08/18 1112 02/08/18 1516 02/08/18 2308 02/09/18 0221  TROPONINI 0.32* 0.78* 0.74* 0.59* 0.47*    CBG: Recent Labs  Lab 02/09/18 0407 02/09/18 0507 02/09/18 0612 02/09/18 0721 02/09/18 0826  GLUCAP 138* 121* 148* 169* 184*    Recent Results (from the past 240 hour(s))  MRSA PCR Screening     Status: None   Collection Time: 02/08/18 10:08 AM  Result Value Ref Range Status   MRSA by PCR NEGATIVE  NEGATIVE Final    Comment:        The GeneXpert MRSA Assay (FDA approved for NASAL specimens only), is one component of a comprehensive MRSA colonization surveillance program. It is not intended to diagnose MRSA infection nor to guide or monitor treatment for MRSA infections. Performed at [facility], [place] [address] [place], [place] [address]      Studies:   Recent x-ray studies have been reviewed in detail by the Attending Physician  Scheduled Meds:  Scheduled Meds: . aspirin  81 mg Oral Daily  . clopidogrel  75 mg Oral Q breakfast  . FLUoxetine  50 mg Oral Daily  . heparin  5,000 Units Subcutaneous Q8H  . levothyroxine  100 mcg Oral Q0600  . metoprolol tartrate  25 mg Oral BID  . rosuvastatin  40 mg Oral q1800    Cherene Altes , MD   [facility] Office  [phone] Pager - Text Page per Amion as per below:  On-Call/Text Page:      [email or web address]  If 7PM-7AM, please contact night-coverage [email or web address]  02/09/2018, 8:39 AM   LOS: 1 day

## 2018-02-09 NOTE — Progress Notes (Deleted)
Cardiology notified of Patients blood pressure of 70's over 50's.  Verbal order given to administer 1 Liter bolus of Normal Saline. Order clarified with physician and was started. Blood pressure currently 100/64 with a map of 76. RN will continue to monitor pt closely.

## 2018-02-09 NOTE — Progress Notes (Addendum)
Progress Note  Patient Name: Jennifer Hogan Date of Encounter: 02/09/2018  Primary Cardiologist: Fransico Him, MD   Subjective   The patient had epigastric pain and vomiting at 4 am, but it has resolved now.  HR remains in low 100'. No chest pain this am.  Inpatient Medications    Scheduled Meds: . aspirin  81 mg Oral Daily  . clopidogrel  75 mg Oral Q breakfast  . FLUoxetine  50 mg Oral Daily  . heparin  5,000 Units Subcutaneous Q8H  . levothyroxine  100 mcg Oral Q0600  . metoprolol tartrate  25 mg Oral BID  . rosuvastatin  40 mg Oral q1800   Continuous Infusions: . calcium gluconate    . dextrose 5 % and 0.45% NaCl 125 mL/hr at 02/09/18 1035  . insulin 3.7 mL/hr at 02/09/18 0900  . nitroGLYCERIN 10 mcg/min (02/08/18 0954)   PRN Meds: acetaminophen, morphine injection, ondansetron (ZOFRAN) IV, oxyCODONE   Vital Signs    Vitals:   02/09/18 0750 02/09/18 0800 02/09/18 0900 02/09/18 1000  BP:  (!) 150/85 (!) 148/86 (!) 146/85  Pulse:  (!) 110 (!) 108 (!) 102  Resp:  (!) 25 18 (!) 31  Temp: 99.2 F (37.3 C)     TempSrc: Oral     SpO2:  92% 94% 95%  Weight:      Height:        Intake/Output Summary (Last 24 hours) at 02/09/2018 1059 Last data filed at 02/09/2018 0900 Gross per 24 hour  Intake 7398.95 ml  Output 1300 ml  Net 6098.95 ml   Last 3 Weights 02/08/2018 12/31/2017 12/03/2017  Weight (lbs) 145 lb 135 lb 145 lb  Weight (kg) 65.772 kg 61.236 kg 65.772 kg     Telemetry    ST  - Personally Reviewed  ECG    Post procedure with ST 111 and ST depression inf.  - Personally Reviewed  Physical Exam   GEN: No acute distress.   Neck: No JVD Cardiac: RRR, no murmurs, rubs, or gallops.  Respiratory: wheezing GI: Soft, nontender, non-distended  MS: No edema; No deformity. Neuro:  Nonfocal  Psych: Normal affect   Labs    Chemistry Recent Labs  Lab 02/08/18 2308 02/09/18 0221 02/09/18 0953  NA 137 136 135  K 3.5 4.0 4.1  CL 112* 109 111    CO2 18* 11* 16*  GLUCOSE 145* 194* 159*  BUN 12 11 8   CREATININE 0.88 0.79 0.73  CALCIUM 8.2* 7.9* 7.7*  GFRNONAA >60 >60 >60  GFRAA >60 >60 >60  ANIONGAP 7 16* 8     Hematology Recent Labs  Lab 02/08/18 0411 02/08/18 1247 02/09/18 0221  WBC 11.1*  --  22.7*  RBC 4.55  --  3.66*  HGB 13.7  --  10.9*  HCT 41.4  --  34.0*  MCV 91.0  --  92.9  MCH 30.1  --  29.8  MCHC 33.1  --  32.1  RDW 13.7  --  14.6  PLT 580* 420* 442*    Cardiac Enzymes Recent Labs  Lab 02/08/18 1112 02/08/18 1516 02/08/18 2308 02/09/18 0221  TROPONINI 0.78* 0.74* 0.59* 0.47*    Recent Labs  Lab 02/08/18 0416  TROPIPOC 0.19*     BNPNo results for input(s): BNP, PROBNP in the last 168 hours.   DDimer No results for input(s): DDIMER in the last 168 hours.   Radiology    Dg Chest Port 1 View  Result Date: 02/08/2018 CLINICAL  DATA:  Chest pain EXAM: PORTABLE CHEST 1 VIEW COMPARISON:  03/15/2017 FINDINGS: Normal heart size and mediastinal contours. Coronary stents. There is no edema, consolidation, effusion, or pneumothorax. Midthoracic degenerative disc narrowing. IMPRESSION: No evidence of active disease. Electronically Signed   By: Monte Fantasia M.D.   On: 02/08/2018 05:09    Cardiac Studies   Cardiac cath 02/08/18  Previously placed Dist LM to Ost LAD drug eluting stent is widely patent.  Balloon angioplasty was performed.  Dist LAD lesion is 99% stenosed.  Balloon angioplasty was performed.  Previously placed Prox LAD drug eluting stent is widely patent.  Balloon angioplasty was performed.  Previously placed Mid LAD-1 drug eluting stent is widely patent.  Balloon angioplasty was performed.  Balloon angioplasty was performed.  Prox Cx lesion is 95% stenosed.  Mid Cx lesion is 60% stenosed.  1st Mrg lesion is 70% stenosed.  Prox LAD to Mid LAD lesion is 70% stenosed.  Mid LAD-2 lesion is 40% stenosed.  Ost 1st Mrg to 1st Mrg lesion is 35% stenosed.  Ost 1st Mrg  lesion is 70% stenosed.  A stent was successfully placed.  Post intervention, there is a 0% residual stenosis.   Diagnostic  Dominance: Right    Intervention      TTE: 02/08/2018  - Left ventricle: The cavity size was normal. Wall thickness was   normal. Basal inferior akinesis. Basal septal hypokinesis.   Systolic function was low normal to mildly reduced. The estimated   ejection fraction was in the range of 50% to 55%. Features are   consistent with a pseudonormal left ventricular filling pattern,   with concomitant abnormal relaxation and increased filling   pressure (grade 2 diastolic dysfunction). - Aortic valve: There was no stenosis. - Mitral valve: There was trivial regurgitation. - Right ventricle: The cavity size was normal. Systolic function   was normal. - Pulmonary arteries: No complete TR doppler jet so unable to   estimate PA systolic pressure. - Inferior vena cava: The vessel was normal in size. The   respirophasic diameter changes were in the normal range (>= 50%),   consistent with normal central venous pressure.  Impressions:  - Normal LV size with EF 50-55%. Wall motion abnormalities as noted   above. Normal RV size and systolic function. No significant   valvular abnormalities.   Patient Profile     48 y.o. female with hx of CAD and prior NSTEMI and PCI to large ramus and LAD, DM-1 on insulin pump, EF 45-50% on last echo, hx of orthostatic hypotension.  She has been on ASA and plavix.   Assessment & Plan    Acute NSTEMI with emergent stent to LCX  pk troponin 0.78 currently on NTG at 10 mcg, I would discontinue and start Imdur 15 mg po daily. I would give metoprolol 25 mg PO BID. LVEF 50-55%.  CAD with severe 3 vessel ASCAD s/p NSTEMIwith cath showing severe three-vessel CAD status post PTCA DES x3 in the LAD(ostial DES placed, mid DES placed, distal DES placed)andPTCA/DES x 1 intermediate branch, 02/2017.   HLD on lipitor 80 pre-hospital.but  stopped due to intolerance.  LDL today is 177. Needs Repatha    Hypothyroid on synthroid, check TSH   IDDM-1 on insulin pump with DKA - IM is following. Stopping insulin pump now.    For questions or updates, please contact [place] Please consult [email or web address] for contact info under   [name] 02/09/2018 10:59 AM

## 2018-02-09 NOTE — Progress Notes (Signed)
Cardiology notified of Patients blood pressure of 70's over 50's.  Verbal order given to administer 1 Liter bolus of Normal Saline. Order clarified with physician and was started. Blood pressure currently 100/64 with a map of 76. RN will continue to monitor pt closely.

## 2018-02-09 NOTE — Progress Notes (Signed)
CARDIAC REHAB PHASE I   PRE:  Rate/Rhythm: Sinus Tach 103  BP:  Supine: 145/84    SaO2: 96% Room air   MODE:  Ambulation: 75 ft   POST:  Rate/Rhythem: 100  BP:    Sitting: 135/84     SaO2: 96 2l/min  1335-1400 patient walked 75 feet in hallway using a rolling walker. Patient complained of feeling a little queasy and gassy otherwise asymptomatic. Patient assisted back to bed with call light within reach. Jennifer Hogan says she is interested in participating in phase 2 cardiac rehab if she can afford it. Patient was given stent card and MI booklet. Will follow up with the patient on Monday.  Harrell Gave RN

## 2018-02-09 NOTE — Research (Signed)
Drop by this am to see if she had any questions. Pt was still sleeping and had a rough night with her blood sugars. Let the nurse know I came by and would come back tomorrow if looked like she was going to be discharged.

## 2018-02-09 NOTE — Research (Signed)
Spoke with patient about [employer] trial. She is very interested. Left consent and information about trial, we will come back on Saturday or Sunday before pt discharged to see if she has any questions about study, and she if she wants to sign up. Jennifer Hogan :)

## 2018-02-10 DIAGNOSIS — E039 Hypothyroidism, unspecified: Secondary | ICD-10-CM

## 2018-02-10 DIAGNOSIS — Z955 Presence of coronary angioplasty implant and graft: Secondary | ICD-10-CM

## 2018-02-10 LAB — CBC
HCT: 32.3 % — ABNORMAL LOW (ref 36.0–46.0)
Hemoglobin: 10.3 g/dL — ABNORMAL LOW (ref 12.0–15.0)
MCH: 29.2 pg (ref 26.0–34.0)
MCHC: 31.9 g/dL (ref 30.0–36.0)
MCV: 91.5 fL (ref 80.0–100.0)
Platelets: 332 10*3/uL (ref 150–400)
RBC: 3.53 MIL/uL — ABNORMAL LOW (ref 3.87–5.11)
RDW: 14.2 % (ref 11.5–15.5)
WBC: 10 10*3/uL (ref 4.0–10.5)
nRBC: 0 % (ref 0.0–0.2)

## 2018-02-10 LAB — BASIC METABOLIC PANEL
Anion gap: 9 (ref 5–15)
BUN: <5 mg/dL — ABNORMAL LOW (ref 6–20)
CO2: 20 mmol/L — ABNORMAL LOW (ref 22–32)
Calcium: 7.9 mg/dL — ABNORMAL LOW (ref 8.9–10.3)
Chloride: 107 mmol/L (ref 98–111)
Creatinine, Ser: 0.62 mg/dL (ref 0.44–1.00)
Glucose, Bld: 166 mg/dL — ABNORMAL HIGH (ref 70–99)
Potassium: 3.3 mmol/L — ABNORMAL LOW (ref 3.5–5.1)
SODIUM: 136 mmol/L (ref 135–145)

## 2018-02-10 LAB — GLUCOSE, CAPILLARY
Glucose-Capillary: 159 mg/dL — ABNORMAL HIGH (ref 70–99)
Glucose-Capillary: 164 mg/dL — ABNORMAL HIGH (ref 70–99)
Glucose-Capillary: 170 mg/dL — ABNORMAL HIGH (ref 70–99)
Glucose-Capillary: 176 mg/dL — ABNORMAL HIGH (ref 70–99)
Glucose-Capillary: 214 mg/dL — ABNORMAL HIGH (ref 70–99)
Glucose-Capillary: 294 mg/dL — ABNORMAL HIGH (ref 70–99)

## 2018-02-10 LAB — MAGNESIUM: Magnesium: 2 mg/dL (ref 1.7–2.4)

## 2018-02-10 MED ORDER — ZOLPIDEM TARTRATE 5 MG PO TABS
5.0000 mg | ORAL_TABLET | Freq: Once | ORAL | Status: AC
  Administered 2018-02-10: 5 mg via ORAL
  Filled 2018-02-10: qty 1

## 2018-02-10 MED ORDER — POTASSIUM CHLORIDE CRYS ER 20 MEQ PO TBCR: 40.0000 meq | EXTENDED_RELEASE_TABLET | Freq: Once | ORAL | Status: AC

## 2018-02-10 MED ORDER — POTASSIUM CHLORIDE CRYS ER 20 MEQ PO TBCR
40.0000 meq | EXTENDED_RELEASE_TABLET | Freq: Once | ORAL | Status: AC
  Administered 2018-02-10: 40 meq via ORAL
  Filled 2018-02-10: qty 2

## 2018-02-10 NOTE — Consult Note (Signed)
[facility] TEAM 1 - Stepdown/ICU TEAM CONSULT F/U NOTE  Jennifer Hogan LYY:[id number] DOB: October 06, 1970 DOA: 02/08/2018 PCP: [name], NP  Admit HPI / Brief Narrative: 48 y.o. female w/ a hx of CAD s/p stents x 3 in 2/19; hypothyroidism; HLD; DM a on an insulin pump; and depression who was admitted w/ a STEMI, and was found to be in DKA as well.    HPI/Subjective: The patient is sitting up in bed.  She denies chest pain shortness of breath fever chills nausea or vomiting.  She tells me she has little appetite but this is not unusual for her.  I discussed her elevated CBGs with her.  She tells me that Dr. Ebony Hail has provided her with a range within which she may adjust her baseline for bolus insulin delivered per her pump.  We have discussed her increasing her basal rate given her consistently elevated CBGs and she feels comfortable taking care of this herself.  Recommendations/Plan:  DKA in Poorly controlled DM1 admitted for DKA 12/9-11/2017 - A1c on 11/15 was 9.7 -DKA now resolved with the patient successfully transitioned back to her insulin pump -patient will increase her basal rate via her pump due to persistently elevated CBGs -she remains amount of DKA per labs this morning however and is safe for discharge from the standpoint with short-term follow-up with Dr. Loanne Drilling advised  ACS -Patient presented with fairly nonspecific symptoms but dynamic EKG changes and positive troponin concerning for STEMI -She was taken urgently to the cath lab and required angioplasty as well as stent placement -She had diffuse disease and will need aggressive medical management -care per Cardiology   Hypothyroidism -Normal TSHand free T4 on 12/31/17 and normal TSH this admission -Continue Synthroid at home dose   HLD -Per cardiology, she stopped taking her Lipitor as an outpatient due to intolerance -They appear to plan to rx Repatha -She was given Crestor for  now  Depression -Continue Paxil  Code Status: FULL Family Communication: no family present at time of exam  Antibiotics: None  DVT prophylaxis: Subcutaneous heparin  Objective: Blood pressure 112/73, pulse 88, temperature 98.2 F (36.8 C), temperature source Oral, resp. rate (!) 22, height 5\' 5"  (1.651 m), weight 65.8 kg, SpO2 97 %.  Intake/Output Summary (Last 24 hours) at 02/10/2018 1047 Last data filed at 02/10/2018 0800 Gross per 24 hour  Intake 1834.08 ml  Output 2400 ml  Net -565.92 ml     Exam: General: No acute respiratory distress Lungs: CTA bilaterally without wheezing Cardiovascular: RRR without murmur or rub Abdomen: NT/ND -soft, bowel sounds positive Extremities: No CCE bilateral lower extremities  Data Reviewed: Basic Metabolic Panel: Recent Labs  Lab 02/08/18 2308 02/09/18 0221 02/09/18 0646 02/09/18 0953 02/09/18 1410 02/10/18 0257  NA 137 136  --  135 136 136  K 3.5 4.0  --  4.1 3.5 3.3*  CL 112* 109  --  111 108 107  CO2 18* 11*  --  16* 19* 20*  GLUCOSE 145* 194*  --  159* 161* 166*  BUN 12 11  --  8 5* <5*  CREATININE 0.88 0.79  --  0.73 0.60 0.62  CALCIUM 8.2* 7.9*  --  7.7* 7.9* 7.9*  MG 2.2 2.2 2.2 2.0 2.0 2.0    CBC: Recent Labs  Lab 02/08/18 0411 02/08/18 1247 02/09/18 0221 02/10/18 0257  WBC 11.1*  --  22.7* 10.0  HGB 13.7  --  10.9* 10.3*  HCT 41.4  --  34.0*  32.3*  MCV 91.0  --  92.9 91.5  PLT 580* 420* 442* 332    Liver Function Tests: No results for input(s): AST, ALT, ALKPHOS, BILITOT, PROT, ALBUMIN in the last 168 hours. No results for input(s): LIPASE, AMYLASE in the last 168 hours. No results for input(s): AMMONIA in the last 168 hours.  Coags: Recent Labs  Lab 02/08/18 0522  INR 1.05   Recent Labs  Lab 02/08/18 0522  APTT 28    Cardiac Enzymes: Recent Labs  Lab 02/08/18 0522 02/08/18 1112 02/08/18 1516 02/08/18 2308 02/09/18 0221  TROPONINI 0.32* 0.78* 0.74* 0.59* 0.47*    CBG: Recent  Labs  Lab 02/09/18 2013 02/09/18 2039 02/09/18 2227 02/10/18 0009 02/10/18 0346  GLUCAP 225* 220* 242* 214* 159*    Recent Results (from the past 240 hour(s))  MRSA PCR Screening     Status: None   Collection Time: 02/08/18 10:08 AM  Result Value Ref Range Status   MRSA by PCR NEGATIVE NEGATIVE Final    Comment:        The GeneXpert MRSA Assay (FDA approved for NASAL specimens only), is one component of a comprehensive MRSA colonization surveillance program. It is not intended to diagnose MRSA infection nor to guide or monitor treatment for MRSA infections. Performed at [facility], [place] [address], [place], [place] [address]      Studies:   Recent x-ray studies have been reviewed in detail by the Attending Physician  Scheduled Meds:  Scheduled Meds: . aspirin  81 mg Oral Daily  . clopidogrel  75 mg Oral Q breakfast  . FLUoxetine  50 mg Oral Daily  . heparin  5,000 Units Subcutaneous Q8H  . insulin pump   Subcutaneous Q4H  . isosorbide mononitrate  15 mg Oral Daily  . levothyroxine  100 mcg Oral Q0600  . metoprolol tartrate  25 mg Oral BID  . rosuvastatin  40 mg Oral q1800    Cherene Altes , MD   [facility] Office  [phone] Pager - Text Page per Amion as per below:  On-Call/Text Page:      [email or web address]  If 7PM-7AM, please contact night-coverage [email or web address]  02/10/2018, 10:47 AM   LOS: 2 days

## 2018-02-10 NOTE — Progress Notes (Signed)
Inpatient Diabetes Program Recommendations  AACE/ADA: New Consensus Statement on Inpatient Glycemic Control (2015)  Target Ranges:  Prepandial:   less than 140 mg/dL      Peak postprandial:   less than 180 mg/dL (1-2 hours)      Critically ill patients:  140 - 180 mg/dL  Results for RAMONICA, GRIGG (MRN [id number]) as of 02/10/2018 08:44  Ref. Range 02/09/2018 12:44 02/09/2018 13:55 02/09/2018 14:58 02/09/2018 16:51 02/09/2018 18:03 02/09/2018 18:57 02/09/2018 20:13 02/09/2018 20:39 02/09/2018 22:27 02/10/2018 00:09 02/10/2018 03:46  Glucose-Capillary Latest Ref Range: 70 - 99 mg/dL 160 (H) 157 (H) 140 (H) 153 (H) 186 (H) 229 (H) 225 (H) 220 (H) 242 (H) 214 (H) 159 (H)   Results for JERNEY, BAKSH (MRN [id number]) as of 02/10/2018 08:44  Ref. Range 12/07/2017 06:01  Hemoglobin A1C Latest Ref Range: 4.8 - 5.6 % 9.7 (H)   Review of Glycemic Control  Diabetes history: DM1 (makes NO insulin; requires basal, correction, and carbohydrate insulin) Outpatient Diabetes medications: Insulin Pump with Humalog Current orders for Inpatient glycemic control: Insulin Pump Q4H  Inpatient Diabetes Program Recommendations:    Insulin Pump: If insulin pump is continued, would recommend continuing Q4H CBGs and insulin pump corrections since glucose is trending high.  Patient likely needs adjustments with her insulin pump settings which will have to be done by patient's Endocrinologist.  Insulin SQ if insulin pump removed: Would recommend Lantus 20 units Q24H (based on 65.8 kg x 0.3 units)., CBGs with Novolog 0-9 units TID with meals, Novolog 0-5 units QHS, and Novolog 3 units TID with meals for meal coverage if patient eats at least 50% of meals.  A1C: A1C 9.7% on 12/07/2017 indicating an average glucose of 232 mg/dl.  NOTE: Noted consult for Diabetes Coordinator. Diabetes Coordinator is not on campus over the weekend but available by pager from 8am to 5pm for questions or concerns. Chart reviewed. Noted patient  was recently inpatient 12/31/17 to 01/02/18 and was seen by Diabetes Coordinator on 01/01/18. Per progress note by S. Theda Sers, RN, Diabetes Coordinator patient is followed by Dr. Loanne Drilling, uses a [employer]tronic insulin pump and the following should be patient's insulin pump settings:  Basal insulin 0.65 units/hour Total daily basal insulin:15.6units/24 hours  Carb Coverage 1:181 unit for every 18grams of carbohydrates  Insulin Sensitivity(or correction bolus) 1:601 unit for each 60 by which glucose exceeds 100 mg/dl  Insulin pump resumed on 02/09/18 around 17:00 and insulin drip stopped at 18:05 on 02/09/18.  A1C seems to correlate with noted glucose trends since insulin pump was resumed on 02/09/18. Anticipate patient needs insulin pump setting adjustments. Inpatient Diabetes Coordinator is not able to provide specific insulin pump adjustments because it is out of our scope of practice. Patient's Endocrinologist will need to provide patient with recommendations for specific insulin pump adjustments to settings. If glucose continue to remain elevated while inpatient, would recommend discontinuing the insulin pump and use SQ insulin regimen (will require basal, correction, and meal coverage insulin).  Thanks, Barnie Alderman, RN, MSN, CDE Diabetes Coordinator Inpatient Diabetes Program [phone] (Team Pager from 8am to 5pm)

## 2018-02-10 NOTE — Progress Notes (Signed)
Progress Note  Patient Name: Jennifer Hogan Date of Encounter: 02/10/2018  Primary Cardiologist: Jennifer Him, MD   Subjective   She feels better today, she was able to walk without symptoms.  Inpatient Medications    Scheduled Meds: . aspirin  81 mg Oral Daily  . clopidogrel  75 mg Oral Q breakfast  . FLUoxetine  50 mg Oral Daily  . heparin  5,000 Units Subcutaneous Q8H  . insulin pump   Subcutaneous Q4H  . isosorbide mononitrate  15 mg Oral Daily  . levothyroxine  100 mcg Oral Q0600  . metoprolol tartrate  25 mg Oral BID  . rosuvastatin  40 mg Oral q1800   Continuous Infusions: . calcium gluconate     PRN Meds: acetaminophen, levalbuterol, morphine injection, ondansetron (ZOFRAN) IV, oxyCODONE   Vital Signs    Vitals:   02/10/18 0800 02/10/18 0900 02/10/18 1016 02/10/18 1100  BP: 104/68 102/65 112/73 91/62  Pulse: 78 78 88 73  Resp: (!) 26 (!) 22  13  Temp:      TempSrc:      SpO2: 95% 97%  99%  Weight:      Height:        Intake/Output Summary (Last 24 hours) at 02/10/2018 1129 Last data filed at 02/10/2018 0800 Gross per 24 hour  Intake 1697.68 ml  Output 1700 ml  Net -2.32 ml   Last 3 Weights 02/08/2018 12/31/2017 12/03/2017  Weight (lbs) 145 lb 135 lb 145 lb  Weight (kg) 65.772 kg 61.236 kg 65.772 kg     Telemetry    ST  - Personally Reviewed  ECG    Post procedure with ST 111 and ST depression inf.  - Personally Reviewed  Physical Exam   GEN: No acute distress.   Neck: No JVD Cardiac: RRR, no murmurs, rubs, or gallops.  Respiratory: wheezing GI: Soft, nontender, non-distended  MS: No edema; No deformity. Neuro:  Nonfocal  Psych: Normal affect   Labs    Chemistry Recent Labs  Lab 02/09/18 0953 02/09/18 1410 02/10/18 0257  NA 135 136 136  K 4.1 3.5 3.3*  CL 111 108 107  CO2 16* 19* 20*  GLUCOSE 159* 161* 166*  BUN 8 5* <5*  CREATININE 0.73 0.60 0.62  CALCIUM 7.7* 7.9* 7.9*  GFRNONAA >60 >60 >60  GFRAA >60 >60 >60    ANIONGAP 8 9 9      Hematology Recent Labs  Lab 02/08/18 0411 02/08/18 1247 02/09/18 0221 02/10/18 0257  WBC 11.1*  --  22.7* 10.0  RBC 4.55  --  3.66* 3.53*  HGB 13.7  --  10.9* 10.3*  HCT 41.4  --  34.0* 32.3*  MCV 91.0  --  92.9 91.5  MCH 30.1  --  29.8 29.2  MCHC 33.1  --  32.1 31.9  RDW 13.7  --  14.6 14.2  PLT 580* 420* 442* 332    Cardiac Enzymes Recent Labs  Lab 02/08/18 1112 02/08/18 1516 02/08/18 2308 02/09/18 0221  TROPONINI 0.78* 0.74* 0.59* 0.47*    Recent Labs  Lab 02/08/18 0416  TROPIPOC 0.19*     BNPNo results for input(s): BNP, PROBNP in the last 168 hours.   DDimer No results for input(s): DDIMER in the last 168 hours.   Radiology    No results found.  Cardiac Studies   Cardiac cath 02/08/18  Previously placed Dist LM to Ost LAD drug eluting stent is widely patent.  Balloon angioplasty was performed.  Dist LAD  lesion is 99% stenosed.  Balloon angioplasty was performed.  Previously placed Prox LAD drug eluting stent is widely patent.  Balloon angioplasty was performed.  Previously placed Mid LAD-1 drug eluting stent is widely patent.  Balloon angioplasty was performed.  Balloon angioplasty was performed.  Prox Cx lesion is 95% stenosed.  Mid Cx lesion is 60% stenosed.  1st Mrg lesion is 70% stenosed.  Prox LAD to Mid LAD lesion is 70% stenosed.  Mid LAD-2 lesion is 40% stenosed.  Ost 1st Mrg to 1st Mrg lesion is 35% stenosed.  Ost 1st Mrg lesion is 70% stenosed.  A stent was successfully placed.  Post intervention, there is a 0% residual stenosis.   Diagnostic  Dominance: Right    Intervention      TTE: 02/08/2018  - Left ventricle: The cavity size was normal. Wall thickness was   normal. Basal inferior akinesis. Basal septal hypokinesis.   Systolic function was low normal to mildly reduced. The estimated   ejection fraction was in the range of 50% to 55%. Features are   consistent with a pseudonormal  left ventricular filling pattern,   with concomitant abnormal relaxation and increased filling   pressure (grade 2 diastolic dysfunction). - Aortic valve: There was no stenosis. - Mitral valve: There was trivial regurgitation. - Right ventricle: The cavity size was normal. Systolic function   was normal. - Pulmonary arteries: No complete TR doppler jet so unable to   estimate PA systolic pressure. - Inferior vena cava: The vessel was normal in size. The   respirophasic diameter changes were in the normal range (>= 50%),   consistent with normal central venous pressure.  Impressions:  - Normal LV size with EF 50-55%. Wall motion abnormalities as noted   above. Normal RV size and systolic function. No significant   valvular abnormalities.   Patient Profile     48 y.o. female with hx of CAD and prior NSTEMI and PCI to large ramus and LAD, DM-1 on insulin pump, EF 45-50% on last echo, hx of orthostatic hypotension.  She has been on ASA and plavix.   Assessment & Plan    Acute NSTEMI with emergent stent to LCX  pk troponin 0.78 currently on NTG at 10 mcg, soft BP after starting Imdur, but no dizziness, continue metoprolol 25 mg PO BID. LVEF 50-55%.  CAD with severe 3 vessel ASCAD s/p NSTEMIwith cath showing severe three-vessel CAD status post PTCA DES x3 in the LAD(ostial DES placed, mid DES placed, distal DES placed)andPTCA/DES x 1 intermediate branch, 02/2017.   HLD on lipitor 80 pre-hospital.but stopped due to intolerance.  LDL today is 177. Needs Repatha , we will refer to the lipid clinic on discharge.  Hypothyroid on synthroid, check TSH   IDDM-1 on insulin pump with DKA - IM is following. Stopping insulin pump now.    We will transfer to telemetry, anticipated discharge tomorrow.  For questions or updates, please contact Jennifer Hogan Please consult [email or web address] for contact info under   Jennifer Hogan 02/10/2018 11:29 AM

## 2018-02-11 ENCOUNTER — Telehealth: Payer: Self-pay | Admitting: Cardiology

## 2018-02-11 LAB — COMPREHENSIVE METABOLIC PANEL
ALK PHOS: 70 U/L (ref 38–126)
ALT: 17 U/L (ref 0–44)
AST: 25 U/L (ref 15–41)
Albumin: 2.9 g/dL — ABNORMAL LOW (ref 3.5–5.0)
Anion gap: 10 (ref 5–15)
BUN: 7 mg/dL (ref 6–20)
CALCIUM: 8.4 mg/dL — AB (ref 8.9–10.3)
CO2: 23 mmol/L (ref 22–32)
Chloride: 106 mmol/L (ref 98–111)
Creatinine, Ser: 0.79 mg/dL (ref 0.44–1.00)
GFR calc Af Amer: >60 mL/min (ref >60)
GFR calc non Af Amer: >60 mL/min (ref >60)
Glucose, Bld: 285 mg/dL — ABNORMAL HIGH (ref 70–99)
Potassium: 4.5 mmol/L (ref 3.5–5.1)
Sodium: 139 mmol/L (ref 135–145)
TOTAL PROTEIN: 5.9 g/dL — AB (ref 6.5–8.1)
Total Bilirubin: 0.9 mg/dL (ref 0.3–1.2)

## 2018-02-11 LAB — GLUCOSE, CAPILLARY
Glucose-Capillary: 183 mg/dL — ABNORMAL HIGH (ref 70–99)
Glucose-Capillary: 136 mg/dL — ABNORMAL HIGH (ref 70–99)
Glucose-Capillary: 123 mg/dL — ABNORMAL HIGH (ref 70–99)
Glucose-Capillary: 277 mg/dL — ABNORMAL HIGH (ref 70–99)

## 2018-02-11 MED ORDER — METOPROLOL TARTRATE 25 MG PO TABS: 25.0000 mg | ORAL_TABLET | Freq: Two times a day (BID) | ORAL | 11 refills | Status: DC

## 2018-02-11 MED ORDER — GLUCOSE BLOOD VI STRP: ORAL_STRIP | 0 refills | Status: DC

## 2018-02-11 MED ORDER — ROSUVASTATIN CALCIUM 40 MG PO TABS: 40.0000 mg | ORAL_TABLET | Freq: Every day | ORAL | 11 refills | Status: DC

## 2018-02-11 MED ORDER — STUDY - AEGIS II STUDY - PLACEBO OR CSL112 (PI-HILTY)
170.0000 mL | Freq: Once | INTRAVENOUS | Status: AC
  Administered 2018-02-11: 170 mL via INTRAVENOUS
  Filled 2018-02-11: qty 170

## 2018-02-11 MED ORDER — ISOSORBIDE MONONITRATE ER 30 MG PO TB24: 15.0000 mg | ORAL_TABLET | Freq: Every day | ORAL | 11 refills | Status: DC

## 2018-02-11 MED ORDER — CLOPIDOGREL BISULFATE 75 MG PO TABS: 75.0000 mg | ORAL_TABLET | Freq: Every day | ORAL | 11 refills | Status: DC

## 2018-02-11 MED FILL — ISOSORBIDE MN ER 30 MG TAB: 30 | 30 days supply | Qty: 30 | Fill #0

## 2018-02-11 MED FILL — CLOPIDOGREL 75 MG TABLET: 75 | 30 days supply | Qty: 30 | Fill #0

## 2018-02-11 MED FILL — METOPROLOL TARTRATE 25 MG T: 25 | 30 days supply | Qty: 60 | Fill #0

## 2018-02-11 MED FILL — ROSUVASTATIN CALCIUM 40 MG: 40 | 30 days supply | Qty: 30 | Fill #0

## 2018-02-11 NOTE — Telephone Encounter (Signed)
° ° ° °  TOC appt 1/30 Jennifer Hogan

## 2018-02-11 NOTE — Progress Notes (Signed)
Inpatient Diabetes Program Recommendations  AACE/ADA: New Consensus Statement on Inpatient Glycemic Control (2015)  Target Ranges:  Prepandial:   less than 140 mg/dL      Peak postprandial:   less than 180 mg/dL (1-2 hours)      Critically ill patients:  140 - 180 mg/dL   Lab Results  Component Value Date   GLUCAP 277 (H) 02/11/2018   HGBA1C 9.7 (H) 12/07/2017    Review of Glycemic Control Results for Jennifer Hogan, Jennifer Hogan (MRN [id number]) as of 02/11/2018 12:36  Ref. Range 02/10/2018 21:08 02/11/2018 01:17 02/11/2018 04:13 02/11/2018 07:50  Glucose-Capillary Latest Ref Range: 70 - 99 mg/dL 176 (H) 183 (H) 136 (H) 123 (H)   Diabetes history: DM1 (makes NO insulin; requires basal, correction, and carbohydrate insulin) Outpatient Diabetes medications: Insulin Pump with Humalog Current orders for Inpatient glycemic control: Insulin pump with Novolog  Basal insulin 0.65 units/hour Total daily basal insulin:15.6units/24 hours  Carb Coverage 1:181 unit for every 18grams of carbohydrates  Insulin Sensitivity(or correction bolus) 1:601 unit for each 60 by which glucose exceeds 100 mg/dl  Spoke with patient regarding insulin pump management. Patient plans to follow up with Dr Loanne Drilling and is in process of making appointment.  Per the patient, "Dr Loanne Drilling helps me with my pump settings and we change it together based off of collected data."   Patient states, "I did happen to adjust my settings while in the hospital to 0.8 units/hr, but that was only for a couple of hours and it has since been changed back to my normal rate." Reviewed patient's most recent A1c of 9.7%. Explained what a A1c is and what it measures. Also reviewed goal A1c with patient, importance of good glucose control @ home, and blood sugar goals. Reviewed patho of DM, vascular changes and comorbidites.  Patient reports feeling frustration on waiting for staff to check CBGs and has struggled to maintain insulin  pump while in the hospital because she ran out of test strips. Requesting prescription for new testing strips. At discharge: Please add Bayer Contour Next test strips [id number]. Patient is frequently checking every couple of hours. Reviewed process for insulin pump management and encouragement provided to patient.  When asked about post prandial value being elevated today, patient reports bolusing (as flowsheet states, unlike in [facility]), but feels that she did not cover at the appropriate time because she was eating over the course of 2 hours. Counseled on ensuring covering of carbs. Denies missing boluses. Plans to follow up with Dr Loanne Drilling for additional insulin pump changes. Patient has no further questions regarding DM at this time.   Thanks, Bronson Curb, MSN, RNC-OB Diabetes Coordinator [phone] (8a-5p)

## 2018-02-11 NOTE — Progress Notes (Addendum)
PROGRESS NOTE    Jennifer Hogan  PYK:[id number] DOB: 06/18/1970 DOA: 02/08/2018 PCP: [name], NP   Brief Narrative: 48 y.o.femalew/ a hx of CAD s/p stents x 3 in 2/19; hypothyroidism; HLD; DM a on an insulin pump; and depression who was admitted w ACS, and was found to be in DKA as well.    Subjective: Resting comfortably.  Feels great.  Would like to go home today.  Assessment & Plan:   Principal Problem:   ACS (acute coronary syndrome) (HCC) Active Problems:   Depression   Hypothyroidism   Type 1 diabetes mellitus (HCC)   NSTEMI (non-ST elevated myocardial infarction) ([place])   Pure hypercholesterolemia   DKA (diabetic ketoacidosis) ([place])   Marijuana abuse   ST elevation myocardial infarction (STEMI) ([place])   S/P drug eluting coronary stent placement   Assessment and plan  DKA in Poorly controlled DM1: admitted for DKA 12/9-11/2017 - A1c on 11/15 was 9.7 -DKA-resolved with the patient successfully transitioned back to her insulin pump -patient will increase her basal rate via her pump due to persistently elevated CBGs.  Sugars since 9 pm ranging 176->183->136->123. Cont on current insulin. She will follow-up with Dr. Loanne Drilling to manage her diabetes.    Non-ST elevation MI with history of CAD: Nonspecific symptoms but dynamically changes and positive troponin, peak 0.78. seen by cardio. status post cardiac cath per report difficult identification of culprit lesion secondary to multifocal disease involving LAD, diagonal and LCx coronary arteries with successful ostial to proximal circumflex stent placement 1/17. Currently without any chest pain, she is on metoprolol 25 twice daily, DAPT with aspirin 81, Plavix 75 and Imdur 15 mg as per cardiology.  To be discharged home today per cardiology.  HLD; ldl 177, placed on high intensity Crestor 40 mg and patient to be entered into clinical trial  Hypothyroidism: -NormalTSHand free T4on 12/31/17 normal . Cont home  Synthroid  Depression: cont Paxil  DVT prophylaxis: Subcu heparin Code Status: full code Family Communication: No family at bedside Disposition Plan: Most likely home today as per primary team.  No further recommendation.  Procedures: CARDIAC CATH W STENT 1/17  Antimicrobials: Anti-infectives (From admission, onward)   None       Objective: Vitals:   02/10/18 1407 02/10/18 1613 02/10/18 2110 02/11/18 0639  BP: 94/65 (!) 91/57 96/62 118/67  Pulse: 74  88 90  Resp: 16  16 16   Temp: 98.7 F (37.1 C) 98.3 F (36.8 C) 99.4 F (37.4 C) 98.4 F (36.9 C)  TempSrc: Oral Oral Oral Oral  SpO2: 99% 98% 96% 100%  Weight:    67.4 kg  Height:        Intake/Output Summary (Last 24 hours) at 02/11/2018 1001 Last data filed at 02/11/2018 0858 Gross per 24 hour  Intake 480 ml  Output 2200 ml  Net -1720 ml   Filed Weights   02/08/18 0406 02/11/18 0639  Weight: 65.8 kg 67.4 kg   Weight change:   Body mass index is 24.74 kg/m.  Intake/Output from previous day: 01/19 0701 - 01/20 0700 In: 720 [P.O.:720] Out: 1200 [Urine:1200] Intake/Output this shift: Total I/O In: -  Out: 1000 [Urine:1000]  Examination:  General exam: Appears calm and comfortable,Not in distress. HEENT:PERRL,Oral mucosa moist, Ear/Nose normal on gross exam Respiratory system: Bilateral equal air entry, normal vesicular breath sounds, no wheezes or crackles  Cardiovascular system: S1 & S2 heard,No JVD, murmurs. Gastrointestinal system: Abdomen is  soft, non tender, non distended, BS +  Nervous System:Alert and oriented. No focal neurological deficits/moving extremities, sensation intact. Extremities: No edema, no clubbing, distal peripheral pulses palpable. Skin: No rashes, lesions, no icterus MSK: Normal muscle bulk,tone ,power  Medications:  Scheduled Meds: . aspirin  81 mg Oral Daily  . clopidogrel  75 mg Oral Q breakfast  . FLUoxetine  50 mg Oral Daily  . heparin  5,000 Units Subcutaneous Q8H    . insulin pump   Subcutaneous Q4H  . isosorbide mononitrate  15 mg Oral Daily  . levothyroxine  100 mcg Oral Q0600  . metoprolol tartrate  25 mg Oral BID  . rosuvastatin  40 mg Oral q1800   Continuous Infusions: . calcium gluconate      Data Reviewed: I have personally reviewed following labs and imaging studies  CBC: Recent Labs  Lab 02/08/18 0411 02/08/18 1247 02/09/18 0221 02/10/18 0257  WBC 11.1*  --  22.7* 10.0  HGB 13.7  --  10.9* 10.3*  HCT 41.4  --  34.0* 32.3*  MCV 91.0  --  92.9 91.5  PLT 580* 420* 442* 161   Basic Metabolic Panel: Recent Labs  Lab 02/08/18 2308 02/09/18 0221 02/09/18 0646 02/09/18 0953 02/09/18 1410 02/10/18 0257  NA 137 136  --  135 136 136  K 3.5 4.0  --  4.1 3.5 3.3*  CL 112* 109  --  111 108 107  CO2 18* 11*  --  16* 19* 20*  GLUCOSE 145* 194*  --  159* 161* 166*  BUN 12 11  --  8 5* <5*  CREATININE 0.88 0.79  --  0.73 0.60 0.62  CALCIUM 8.2* 7.9*  --  7.7* 7.9* 7.9*  MG 2.2 2.2 2.2 2.0 2.0 2.0   GFR: Estimated Creatinine Clearance: 76.5 mL/min (by C-G formula based on SCr of 0.62 mg/dL). Liver Function Tests: No results for input(s): AST, ALT, ALKPHOS, BILITOT, PROT, ALBUMIN in the last 168 hours. No results for input(s): LIPASE, AMYLASE in the last 168 hours. No results for input(s): AMMONIA in the last 168 hours. Coagulation Profile: Recent Labs  Lab 02/08/18 0522  INR 1.05   Cardiac Enzymes: Recent Labs  Lab 02/08/18 0522 02/08/18 1112 02/08/18 1516 02/08/18 2308 02/09/18 0221  TROPONINI 0.32* 0.78* 0.74* 0.59* 0.47*   BNP (last 3 results) No results for input(s): PROBNP in the last 8760 hours. HbA1C: No results for input(s): HGBA1C in the last 72 hours. CBG: Recent Labs  Lab 02/10/18 1608 02/10/18 2108 02/11/18 0117 02/11/18 0413 02/11/18 0750  GLUCAP 294* 176* 183* 136* 123*   Lipid Profile: No results for input(s): CHOL, HDL, LDLCALC, TRIG, CHOLHDL, LDLDIRECT in the last 72 hours. Thyroid  Function Tests: Recent Labs    02/08/18 1112  TSH 0.934   Anemia Panel: No results for input(s): VITAMINB12, FOLATE, FERRITIN, TIBC, IRON, RETICCTPCT in the last 72 hours. Sepsis Labs: No results for input(s): PROCALCITON, LATICACIDVEN in the last 168 hours.  Recent Results (from the past 240 hour(s))  MRSA PCR Screening     Status: None   Collection Time: 02/08/18 10:08 AM  Result Value Ref Range Status   MRSA by PCR NEGATIVE NEGATIVE Final    Comment:        The GeneXpert MRSA Assay (FDA approved for NASAL specimens only), is one component of a comprehensive MRSA colonization surveillance program. It is not intended to diagnose MRSA infection nor to guide or monitor treatment for MRSA infections. Performed at [facility], [place],  [place] [address]       Radiology Studies: No results found.    LOS: 3 days   Time spent: More than 50% of that time was spent in counseling and/or coordination of care.  Antonieta Pert, MD [facility]  02/11/2018, 10:01 AM

## 2018-02-11 NOTE — Progress Notes (Signed)
CARDIAC REHAB PHASE I   PRE:  Rate/Rhythm: 89 SR   BP:  Supine:   Sitting: 115/83  Standing:    SaO2:   MODE:  Ambulation: 770 ft   POST:  Rate/Rhythm: 110 ST  BP:  Supine:   Sitting: 108/51  Standing:    SaO2: 99%RA 0845-0945 Pt walked 770 ft on RA with steady gait and no CP. Tolerated well. MI education completed with pt who voiced understanding. Stressed importance of plavix with stent. Gave diabetic and heart healthy diets, reviewed NTG use, ex ed and CRP 2. Has referral to [place] program. Encouraged pt to make time for herself as trying to work and take care of herself and take care of her parents. Pt very positive but has many stressors.   Graylon Good, RN BSN  02/11/2018 9:41 AM

## 2018-02-11 NOTE — Research (Signed)
Patient enrolled in AEGIS II trial.  Consent was obtained.  Patient seen and study discussed in detail previously.  Currently stable and awaiting labs.   Exam with VS as noted.  Lungs were clear.  Cardiac unchanged and regular. Patient is [place] Class I.   Plan is for DC today after labs, and possible infusion.   Loretha Brasil. Lia Foyer, MD, [facility], [place] Director, [facility]

## 2018-02-11 NOTE — Research (Signed)
AEGIS  Informed Consent   Subject Name: Jennifer Hogan  Subject met inclusion and exclusion criteria.  The informed consent form, study requirements and expectations were reviewed with the subject and questions and concerns were addressed prior to the signing of the consent form.  The subject verbalized understanding of the trail requirements.  The subject agreed to participate in the Aegis trial and signed the informed consent.  The informed consent was obtained prior to performance of any protocol-specific procedures for the subject.  A copy of the signed informed consent was given to the subject and a copy was placed in the subject's medical record.  Philemon Kingdom D 02/11/2018, 0820

## 2018-02-11 NOTE — Discharge Summary (Addendum)
Discharge Summary    [name] MRN: [id number]; DOB: 01-10-1971  Admit date: 02/08/2018 Discharge date: 02/11/2018  Primary Care Provider: [name], NP  Primary Cardiologist: Fransico Him, MD   Discharge Diagnoses    Principal Problem:   ACS (acute coronary syndrome) [facility]) Active Problems:   Depression   Hypothyroidism   Type 1 diabetes mellitus ([place])   NSTEMI (non-ST elevated myocardial infarction) ([place])   Pure hypercholesterolemia   DKA (diabetic ketoacidosis) ([place])   Marijuana abuse   ST elevation myocardial infarction (STEMI) ([place])   S/P drug eluting coronary stent placement  Allergies Allergies  Allergen Reactions  . Ciprofloxin Hcl [Ciprofloxacin] Nausea And Vomiting and Other (See Comments)    Severe migraine  . Food Color Red [Red Dye] Diarrhea   Diagnostic Studies/Procedures    Cardiac cath 02/08/18  Previously placed Dist LM to Ost LAD drug eluting stent is widely patent.  Balloon angioplasty was performed.  Dist LAD lesion is 99% stenosed.  Balloon angioplasty was performed.  Previously placed Prox LAD drug eluting stent is widely patent.  Balloon angioplasty was performed.  Previously placed Mid LAD-1 drug eluting stent is widely patent.  Balloon angioplasty was performed.  Balloon angioplasty was performed.  Prox Cx lesion is 95% stenosed.  Mid Cx lesion is 60% stenosed.  1st Mrg lesion is 70% stenosed.  Prox LAD to Mid LAD lesion is 70% stenosed.  Mid LAD-2 lesion is 40% stenosed.  Ost 1st Mrg to 1st Mrg lesion is 35% stenosed.  Ost 1st Mrg lesion is 70% stenosed.  A stent was successfully placed.  Post intervention, there is a 0% residual stenosis.  Diagnostic  Dominance: Right   Intervention      Acute coronary syndrome with difficult to identify culprit in the setting of multifocal disease involving the LAD, diagonal, and circumflex coronary arteries.  System delay occurred in achieving  revascularization due to atypical symptoms on presentation and time spent deciding upon treatment strategy after anatomy was defined.  Successful ostial to proximal circumflex stent from 95% to 0% with TIMI grade III flow using a 2.25 x 15 Onyx postdilated to 2.75 mm in diameter.. The mid circumflex contains eccentric focal 70% stenosis.  Widely patent left main  LAD is large before branching into a very large first diagonal and the continuation of the LAD. A proximal and mid stent are noted in the LAD followed by an extremely long stent in the mid to distal LAD. In the mid LAD which was not stented there is 75% stenosis. The diagonal contains new concentric 70% ostial narrowing and a mid vessel stent with distal stent margin ISR up to 50%. Beyond the stent there is segmental 80% stenosis.  Mild anteroapical hypokinesis. EF 50%. LVEDP was less than 10.  RECOMMENDATIONS:   Patient should probably have a surgical consultation to see if her targets would be adequate for surgical revascularization if symptoms continue.  If symptoms resolve, medical therapy for now and aggressive risk factor modification.  Discussed with team member, Dr. Quay Burow, who will be caring for the patient later today.   TTE: 02/08/2018  - Left ventricle: The cavity size was normal. Wall thickness was normal. Basal inferior akinesis. Basal septal hypokinesis. Systolic function was low normal to mildly reduced. The estimated ejection fraction was in the range of 50% to 55%. Features are consistent with a pseudonormal left ventricular filling pattern, with concomitant abnormal relaxation and increased filling pressure (grade 2 diastolic dysfunction). - Aortic  valve: There was no stenosis. - Mitral valve: There was trivial regurgitation. - Right ventricle: The cavity size was normal. Systolic function was normal. - Pulmonary arteries: No complete TR doppler jet so unable to estimate  PA systolic pressure. - Inferior vena cava: The vessel was normal in size. The respirophasic diameter changes were in the normal range (>= 50%), consistent with normal central venous pressure.  Impressions:  - Normal LV size with EF 50-55%. Wall motion abnormalities as noted above. Normal RV size and systolic function. No significant valvular abnormalities.  History of Present Illness     Jennifer Hogan is a 48 y.o. female with history of coronary artery disease status post NSTEMI and PCI to large ramus and LAD (02/2017) and DM-1 on insulin pump who presented to [facility] on 02/08/2018 with chest pain.    Pt stated that she had not been feeling well prior to presentation and thought she may have been coming down with the flu.  She was awoken from sleep  with severe substernal chest pain with associated nausea.  She presented to the [facility] ED on 02/08/2018, where she was found to be tachycardic and mildly hypertensive. Initial ECG showed ST elevation in aVR, diffuse hyperacute T waves across the precordium and ST elevation in aVL. Given acute chest pain and diffuse ischemic ECG changes the cath lab was activated. She was given full dose ASA, Zofran, IV morphine, and 4000 units heparin in the ED.  She continued to have CP despite the above interventions and three SL NTG.   Hospital Course     She was taken to the cath lab on 02/08/2018 which revealed patent left main, proximal and distal LAD stent patent with mid vessel 75% stenosis between the 2 stents. There was a large first diagonal with new ostial 70% stenosis, mid diagonal stent which contained distal in-stent restenosis up to 50% and beyond the stented segment there is segmental 80% stenosis. There was a relatively small circumflex containing a 95% ostial to proximal stenosis followed by 50% eccentric stenosis in the mid vessel. The right coronary is large and widely patent. There were multiple lesions which may have been the culprit for  her ongoing symptoms difficult. Per cath note, PTCA/DES to proximal/ostial circumflex was placed with 2.5 x 15 Onyx.   She was transferred to CCU on NTG gtt for residual pain. Imdur was added to her regimen. She had a brief episode of hypotension post procedure in which her NTG gtt was turned off. She had no recurrent chest pain and was stable. Her BP's were monitored closely and were soft, but stable. Cath site remained unremarkable without complications.   Her troponin peaked at 0.78>0.74>0.59>0.47. She was continued on metoprolol 25mg  twice daily, ASA 81 and Plavix 75  Other hospital problems include:  -HLD: -Was started on high intensity statin with Crestor 40 and tolerated well -Pt entered into AEGIS II clinical trail. Received one infusion prior to discharge  -LDL, 177 on hospital admission   -Hypothyroidism: -TSH WNL at 0.934 02/08/2018 -Continue Synthroid  -IDDM-1 on insulin pump with DKA: -Pt was initially in DKA on presentation, likely in the setting of acute episode. Her insulin pump was turned off and she was covered with SSI on presentation. Post cath, her insulin pump was resumed without problems. She increased her basal rate via her pump due to persistently elevated CBGs. Cont on current insulin. She will follow-up with Dr. Loanne Drilling to manage her diabetes  -Hypotension: -Stable however low normal, 118/67>96/62>91/57>94/65 -  Monitor for symptoms, currently asymptomatic    Consultants: Internal Medicine    The patient was seen and examined by Dr. Martinique who feels that she is stable and ready for discharge. Cath site unremarkable. Pt has ambulated with cardiac rehabilitation without complication.  _____________  Discharge Vitals Blood pressure 97/63, pulse 63, temperature 98.2 F (36.8 C), temperature source Oral, resp. rate 20, height 5\' 5"  (1.651 m), weight 67.4 kg, SpO2 100 %.  Filed Weights   02/08/18 0406 02/11/18 0639  Weight: 65.8 kg 67.4 kg   Labs & Radiologic  Studies    CBC Recent Labs    02/09/18 0221 02/10/18 0257  WBC 22.7* 10.0  HGB 10.9* 10.3*  HCT 34.0* 32.3*  MCV 92.9 91.5  PLT 442* 270   Basic Metabolic Panel Recent Labs    02/09/18 1410 02/10/18 0257 02/11/18 0930  NA 136 136 139  K 3.5 3.3* 4.5  CL 108 107 106  CO2 19* 20* 23  GLUCOSE 161* 166* 285*  BUN 5* <5* 7  CREATININE 0.60 0.62 0.79  CALCIUM 7.9* 7.9* 8.4*  MG 2.0 2.0  --    Liver Function Tests Recent Labs    02/11/18 0930  AST 25  ALT 17  ALKPHOS 70  BILITOT 0.9  PROT 5.9*  ALBUMIN 2.9*   Cardiac Enzymes Recent Labs    02/08/18 1516 02/08/18 2308 02/09/18 0221  TROPONINI 0.74* 0.59* 0.47*  _____________  Dg Chest Port 1 View  Result Date: 02/08/2018 CLINICAL DATA:  Chest pain EXAM: PORTABLE CHEST 1 VIEW COMPARISON:  03/15/2017 FINDINGS: Normal heart size and mediastinal contours. Coronary stents. There is no edema, consolidation, effusion, or pneumothorax. Midthoracic degenerative disc narrowing. IMPRESSION: No evidence of active disease. Electronically Signed   By: Monte Fantasia M.D.   On: 02/08/2018 05:09   Disposition   Pt is being discharged home today in good condition.  Follow-up Plans & Appointments   Follow-up Information    Consuelo Pandy, PA-C Follow up on 02/21/2018.   Specialties:  Cardiology, Radiology Why:  Your follow up appointment will be on 02/21/2018 at 0900am  Contact information: [place] [id number] [phone]          Discharge Instructions    Amb Referral to Cardiac Rehabilitation   Complete by:  As directed    Diagnosis:   Coronary Stents NSTEMI     Call MD for:  difficulty breathing, headache or visual disturbances   Complete by:  As directed    Call MD for:  extreme fatigue   Complete by:  As directed    Call MD for:  hives   Complete by:  As directed    Call MD for:  persistant dizziness or light-headedness   Complete by:  As directed    Call MD for:   persistant nausea and vomiting   Complete by:  As directed    Call MD for:  redness, tenderness, or signs of infection (pain, swelling, redness, odor or green/yellow discharge around incision site)   Complete by:  As directed    Call MD for:  severe uncontrolled pain   Complete by:  As directed    Call MD for:  temperature >100.4   Complete by:  As directed    Diet - low sodium heart healthy   Complete by:  As directed    Discharge instructions   Complete by:  As directed    No driving for 3 days. No lifting  over 5 lbs for 1 week. No sexual activity for 1 week. Keep procedure site clean & dry. If you notice increased pain, swelling, bleeding or pus, call/return!  You may shower, but no soaking baths/hot tubs/pools for 1 week.   PLEASE DO NOT MISS ANY DOSES OF YOUR PLAVIX!!!!! Also keep a log of you blood pressures and bring back to your follow up appt. Please call the office with any questions.   Patients taking blood thinners should generally stay away from medicines like ibuprofen, Advil, Motrin, naproxen, and Aleve due to risk of stomach bleeding. You may take Tylenol as directed or talk to your primary doctor about alternatives.  Some studies suggest Prilosec/Omeprazole interacts with Plavix. If you have reflux, please use Prilosec/Omeprazole to the equivalent dose of Protonix for less chance of interaction.   If you notice any bleeding such as blood in stool, black tarry stools, blood in urine, nosebleeds or any other unusual bleeding, call your doctor immediately. It is not normal to have this kind of bleeding while on a blood thinner and usually indicates there is an underlying problem with one of your body systems that needs to be checked out.   Increase activity slowly   Complete by:  As directed      Discharge Medications   Allergies as of 02/11/2018      Reactions   Ciprofloxin Hcl [ciprofloxacin] Nausea And Vomiting, Other (See Comments)   Severe migraine   Food Color Red  [red Dye] Diarrhea      Medication List    STOP taking these medications   atorvastatin 80 MG tablet Commonly known as:  LIPITOR   ENLITE GLUCOSE SENSOR Misc   glucose blood test strip Commonly known as:  BAYER CONTOUR NEXT TEST   metoprolol succinate 25 MG 24 hr tablet Commonly known as:  TOPROL XL   MINIMED INFUSION SET-MMT 399 Misc     TAKE these medications   albuterol 108 (90 Base) MCG/ACT inhaler Commonly known as:  PROVENTIL HFA;VENTOLIN HFA Inhale 2 puffs into the lungs every 6 (six) hours as needed for wheezing or shortness of breath.   aspirin 81 MG EC tablet Take 1 tablet (81 mg total) by mouth daily.   clopidogrel 75 MG tablet Commonly known as:  PLAVIX Take 1 tablet (75 mg total) by mouth daily with breakfast. Start taking on:  February 12, 2018 What changed:  when to take this   FLUoxetine 10 MG capsule Commonly known as:  PROZAC Take 1 capsule (10 mg total) by mouth daily.   FLUoxetine 40 MG capsule Commonly known as:  PROZAC Take 1 capsule (40 mg total) by mouth daily.   HYDROcodone-acetaminophen 5-325 MG tablet Commonly known as:  NORCO Take 1 tablet by mouth every 4 (four) hours as needed (for pain).   insulin aspart 100 UNIT/ML injection Commonly known as:  novoLOG 35 units per insulin pump daily What changed:    how much to take  how to take this  when to take this  additional instructions   isosorbide mononitrate 30 MG 24 hr tablet Commonly known as:  IMDUR Take 0.5 tablets (15 mg total) by mouth daily.   levothyroxine 100 MCG tablet Commonly known as:  SYNTHROID Take 1 tablet (100 mcg total) by mouth daily before breakfast.   metoprolol tartrate 25 MG tablet Commonly known as:  LOPRESSOR Take 1 tablet (25 mg total) by mouth 2 (two) times daily. Hold for SBP<90 and HR<50   montelukast 10 MG tablet  Commonly known as:  SINGULAIR Take 1 tablet (10 mg total) by mouth at bedtime.   nitroGLYCERIN 0.4 MG SL tablet Commonly known  as:  NITROSTAT Place 1 tablet (0.4 mg total) under the tongue every 5 (five) minutes as needed for chest pain.   ondansetron 4 MG tablet Commonly known as:  ZOFRAN Take 1 tablet (4 mg total) by mouth every 8 (eight) hours as needed for nausea or vomiting.   pantoprazole 40 MG tablet Commonly known as:  PROTONIX Take 1 tablet (40 mg total) by mouth daily.   rosuvastatin 40 MG tablet Commonly known as:  CRESTOR Take 1 tablet (40 mg total) by mouth daily at 6 PM.        Acute coronary syndrome (MI, NSTEMI, STEMI, etc) this admission?: Yes.     AHA/ACC Clinical Performance & Quality Measures: 1. Aspirin prescribed? - Yes 2. ADP Receptor Inhibitor (Plavix/Clopidogrel, Brilinta/Ticagrelor or Effient/Prasugrel) prescribed (includes medically managed patients)? - Yes 3. Beta Blocker prescribed? - Yes 4. High Intensity Statin (Lipitor 40-80mg  or Crestor 20-40mg ) prescribed? - Yes 5. EF assessed during THIS hospitalization? - Yes 6. For EF <40%, was ACEI/ARB prescribed? - Not Applicable (EF >/= 25%) 7. For EF <40%, Aldosterone Antagonist (Spironolactone or Eplerenone) prescribed? - Not Applicable (EF >/= 49%) 8. Cardiac Rehab Phase II ordered (Included Medically managed Patients)? - Yes   Outstanding Labs/Studies   LFT's and repeat lipid in 6-8 weeks although this may be followed for AEGIS II clinical trail   Duration of Discharge Encounter   Greater than 30 minutes including physician time.  Signed, Kathyrn Drown, NP 02/11/2018, 12:01 PM  Patient seen and examined and history reviewed. Agree with above findings and plan. See my prior rounding note from today  Jakarri Lesko Martinique, [place] 02/11/2018 12:59 PM

## 2018-02-11 NOTE — Telephone Encounter (Signed)
The pt is being discharged from the hospital today. We will call tomorrow. 

## 2018-02-11 NOTE — Research (Signed)
Inclusion/Exclusion Checklist:   Inclusions:   Y N   _0  _1  Female or female at least 50 years of age  _2  _3  Evidence of type I (spontaneous) MI (STEMI or NSTEMI) caused by atherothrombotic artery disease as defined by the following:  _4  _5  a. Detection of a rise and/or fall in Troponin I or T with at least 1 value about the 99% upper reference limit.     (AND)---  Any 1 or more of the following:   _6  _7  - symptoms of ischemia (ie, resulting from a primary coronary    artery event)  _8  _9  - New or presumably new significant ST/T wave changes or left bundle branch block.  _10  _11       - Development of pathological Q waves on EKG  _12  _13  - Imaging evidence of new loss or viable myocardium or regional wall motion abnormality.  _14  _15  - ID of intracoronary thrombus by angiography.  _16  _17  No suspicion of acute kidney injury at least 12 hours after angiography OR after first medical contract for subject's not undergoing angiography There must be documented evidence of stable renal function defined as no more than an increase in Serum Creatinine < 0.55m/dl from pre-contrast serum creatinine value.  (Before _1.07__   12 hrs after ________)    Evidence of multi-vessel coronary artery disease defined as:  _18  _19  A. At least 50% stenosis of theleft main coronary artery or at least 2 epicardial coronary artery territories (LAD, LCx, RCA) on catherization performed during the index hospitalization.   _20  _21  B. Prior cardiac catherization documenting @ least 50% stenosis of the LM or at least 2 epicardial >1 epicardial artery territories (LAD, LCx, RCA)  _22  _23  C. Prior PCI and evidence of 50% stenosis of at least 1 epicardial coronary artery territory different from prior revascularized artery territory.   _24  _25  D. Prior multivessel coronary artery bypass grafting.  _26  _27  Plus either Established risk factors:  _28  _29  o On pharmacological treatment for diabetes mellitus                 OR    TWO of the  following  _30  _31  o Prior history of MI  _32  _33  o Age ? 65 years  _34  _35  o Peripheral arterial disease defined as meeting at least 1 of the following criteria:  _36  _37          +   Current intermittent claudication or resting limb ischemia and ABI    ?0.90  _38  _39          +   History of peripheral revascularization (surgical or percutaneous)  _40  _41          +  History of limb amputation due to PAD  _42  _43          +  Angiographic evidence (using computed tomographic angiography, MRA, or invasive angiography or a peripheral artery stenosis ?50%.  _44  _45  If the female subject without child bearing potential, not breastfeeding, not pregnant, and if of child bearing potential agree to contraception or lifestyle methods to avoid pregnancy? Child-bearing potential (must select all)   __X__ not pregnant (by urine or serum hCG AND   ____ willing to use an acceptable method of contraception to avoid pregnancy during the study and for 3 months after last dose of investional product (refer to acceptable methods per protocol)   ____ if breastfeeding, willing to cease breastfeeding Date of pregnancy test: (_17_ /_Jan__/ _2020__)  Result  -  Neg Not of Child bearing potential (select one)   ____ Age >= 27   ____ Age 3-60 with amenorrhea for at least 1 year with documented evidence of follicle-stimulating hormone level >40 IU/L   ____ Surgically sterile for at least 3 months prior to randomization  _0  _1  Investigator believes that the subject is willing and able to adhere to all protocol requirements.   _2  _3  Willing to not participate in another investigational study until completion of their final study visit.     Exclusions:  Y N   _4  _5  If these are the reason for MI (pt is excluded)  _6  _7  1. Myocardial necrosis due mismatch between myocardial oxygen demand and supply, usually due to fixed coronary disease with increased demand leading to MI  _8  _9  2. Cardiac death due to MI  _10  _11  3. Myocardial  necrosis due to complications from a PCI  _12  _13  4. Myocardial necrosis due to stent thrombosis  _14  _15  5. Myocardial necrosis due to in stent restenosis as the only etiology  _16  _17  6. Myocardial necrosis in the stenting of coronary artery bypass grafting  _18  _19  Ongoing hemodynamic instability  _20  _21       +  History of NYHA Class III or IV heart failure within the last year  _22  _23       +  [place] Class III or IV heart failure  _24  _25       +  Sustained and/or symptomatic hypotension (SBP <90 mm HG)  _26  _27       +  Known left ventricular ejection fraction of <30%  _28  _29  Evidence of hepatobiliary disease as indicated by any 1 or more of the   following at screening:  _30  _31       +  Current active hepatic dysfunction or active biliary obstruction  _32  _33       +       +  Chronic or prior history of cirrhosis or of infectious / inflammatory hepatitis NOTE: If a patient has a medical history of recovered Hep A, B, or C without evidence of cirrhosis, he/she could be considered for inclusion if there is documented evidence that there is no active infection (ie, antigen negative)  _34  _35       + Hepatic lab abnormalities: ALT > 3 x ULN or Total bilirubin > 2x ULN at randomization.  _36  _37  Severe chronic kidney disease (eGFR of <26m) or on dialysis  _38  _39  Plan to undergo scheduled coronary artery bypass graft surgery after randomization, as determined at the time of screening  _40  _41  Known history of allergies to soybeans, peanuts, albumin  _42  _43  Body weight <50 kg  _44  _45  A known history of IgA deficiency or antibodies to IgA  _46  _47  A comorbid condition with an estimated life expectancy of ? 6 months at time of consent  _48  _49  Women who are pregnant or breastfeeding at time of randomization  _50  _51  Participated in another interventional clinical study at the time of consent  _52  _53  Treatment with anticancer therapy  _54  _55  Previously randomized or participated in this study or previously exposed  to [place]  Ref Range & Units 02/08/2018 @ 0411  Sodium 135 - 145 mmol/L 136   Potassium 3.5 - 5.1 mmol/L 4.3   Chloride 98 - 111 mmol/L 98   CO2 22 - 32 mmol/L 19Low    Glucose, Bld 70 - 99 mg/dL 349High    BUN  6 - 20 mg/dL 16   Creatinine, Ser 0.44 - 1.00 mg/dL 1.07High    Calcium 8.9 - 10.3 mg/dL 9.9   GFR calc non Af Amer >60 mL/min >60   GFR calc Af Amer >60 mL/min >60   Anion gap 5 - 15 19High     Post Cath Labs:  Component     Latest Ref Rng & Units 02/11/2018 0930          Sodium     135 - 145 mmol/L 139  Potassium     3.5 - 5.1 mmol/L 4.5  Chloride     98 - 111 mmol/L 106  CO2     22 - 32 mmol/L 23  Glucose     70 - 99 mg/dL 285 (H)  BUN     6 - 20 mg/dL 7  Creatinine     0.44 - 1.00 mg/dL 0.79  Calcium     8.9 - 10.3 mg/dL 8.4 (L)  Total Protein     6.5 - 8.1 g/dL 5.9 (L)  Albumin     3.5 - 5.0 g/dL 2.9 (L)  AST     15 - 41 U/L 25  ALT     0 - 44 U/L 17  Alkaline Phosphatase     38 - 126 U/L 70  Total Bilirubin     0.3 - 1.2 mg/dL 0.9  GFR, Est Non African American     >60 mL/min >60  GFR, Est African American     >60 mL/min >60  Anion gap     5 - 15 10    Pregnancy Test:  Ref Range & Units 3d ago   I-stat hCG, quantitative <5 mIU/mL <5.0   Comment 3       Comment: GEST. AGE   CONC. (mIU/mL)   <=1 WEEK    5 - 50    2 WEEKS    50 - 500    3 WEEKS    100 - 10,000    4 WEEKS   1,000 - 30,000      FEMALE AND NON-PREGNANT FEMALE:    LESS THAN 5 mIU/mL

## 2018-02-11 NOTE — Telephone Encounter (Signed)
Second attempt to reach pt remains unsuccessful. LVM requesting returned call.

## 2018-02-11 NOTE — Discharge Instructions (Signed)
Information about your medication: Plavix (anti-platelet agent) ° °Generic Name (Brand): clopidogrel (Plavix), once daily medication ° °PURPOSE: You are taking this medication along with aspirin to lower your chance of having a heart attack, stroke, or blood clots in your heart stent. These can be fatal. Brilinta and aspirin help prevent platelets from sticking together and forming a clot that can block an artery or your stent.  ° °Common SIDE EFFECTS you may experience include: bruising or bleeding more easily, shortness of breath ° °Do not stop taking PLAVIX without talking to the doctor who prescribes it for you. People who are treated with a stent and stop taking Plavix too soon, have a higher risk of getting a blood clot in the stent, having a heart attack, or dying. If you stop Plavix because of bleeding, or for other reasons, your risk of a heart attack or stroke may increase.  ° °Tell all of your doctors and dentists that you are taking Plavix. They should talk to the doctor who prescribed plavix for you before you have any surgery or invasive procedure.  ° °Contact your health care provider if you experience: severe or uncontrollable bleeding, pink/red/brown urine, vomiting blood or vomit that looks like "coffee grounds", red or black stools (looks like tar), coughing up blood or blood clots °---------------------------------------------------------------------------------------------------------------------- ° °

## 2018-02-11 NOTE — Research (Signed)
Pt doing well, no complaints of cp or sob.  Infusion went well.                                    "CONSENT"   YES     NO   Continuing further Investigational Product and study visits for follow-up? [x]  []   Continuing consent from future biomedical research [x]  []                                    "EVENTS"    YES     NO  AE   (IF YES SEE SOURCE) []  [x]   SAE  (IF YES SEE SOURCE) []  [x]   ENDPOINT   (IF YES SEE SOURCE) []  [x]   REVASCULARIZATION  (IF YES SEE SOURCE) []  [x]   AMPUTATION   (IF YES SEE SOURCE) []  [x]   TROPONIN'S  (IF YES SEE SOURCE) []  [x]      Current Facility-Administered Medications:  .  acetaminophen (TYLENOL) tablet 650 mg, 650 mg, Oral, Q6H PRN, Dorothy Spark, MD .  aspirin chewable tablet 81 mg, 81 mg, Oral, Daily, Dorothy Spark, MD, 81 mg at 02/11/18 1019 .  calcium gluconate 2 g/ 100 mL sodium chloride IVPB, 2 g, Intravenous, Once, Dorothy Spark, MD .  clopidogrel (PLAVIX) tablet 75 mg, 75 mg, Oral, Q breakfast, Dorothy Spark, MD, 75 mg at 02/11/18 0847 .  FLUoxetine (PROZAC) capsule 50 mg, 50 mg, Oral, Daily, Dorothy Spark, MD, 50 mg at 02/11/18 1019 .  heparin injection 5,000 Units, 5,000 Units, Subcutaneous, Q8H, Dorothy Spark, MD, 5,000 Units at 02/11/18 [phone] .  insulin pump, , Subcutaneous, Q4H, Dorothy Spark, MD, 0.6 each at 02/10/18 2149 .  isosorbide mononitrate (IMDUR) 24 hr tablet 15 mg, 15 mg, Oral, Daily, Dorothy Spark, MD, 15 mg at 02/11/18 1019 .  levalbuterol (XOPENEX) nebulizer solution 0.63 mg, 0.63 mg, Nebulization, Q6H PRN, Dorothy Spark, MD .  levothyroxine (SYNTHROID, LEVOTHROID) tablet 100 mcg, 100 mcg, Oral, Q0600, Dorothy Spark, MD, 100 mcg at 02/11/18 0445 .  metoprolol tartrate (LOPRESSOR) tablet 25 mg, 25 mg, Oral, BID, Dorothy Spark, MD, 25 mg at 02/11/18 1019 .  morphine 2 MG/ML injection 2 mg, 2 mg, Intravenous, Q2H PRN, Dorothy Spark, MD, 2 mg at 02/09/18 9326 .  ondansetron  (ZOFRAN) injection 4 mg, 4 mg, Intravenous, Q6H PRN, Dorothy Spark, MD, 4 mg at 02/09/18 1649 .  oxyCODONE (Oxy IR/ROXICODONE) immediate release tablet 5-10 mg, 5-10 mg, Oral, Q4H PRN, Dorothy Spark, MD .  rosuvastatin (CRESTOR) tablet 40 mg, 40 mg, Oral, q1800, Dorothy Spark, MD, 40 mg at 02/10/18 1713  Current Outpatient Medications:  .  albuterol (PROVENTIL HFA;VENTOLIN HFA) 108 (90 Base) MCG/ACT inhaler, Inhale 2 puffs into the lungs every 6 (six) hours as needed for wheezing or shortness of breath., Disp: 1 Inhaler, Rfl: 0 .  aspirin 81 MG EC tablet, Take 1 tablet (81 mg total) by mouth daily., Disp: 90 tablet, Rfl: 0 .  FLUoxetine (PROZAC) 10 MG capsule, Take 1 capsule (10 mg total) by mouth daily., Disp: 30 capsule, Rfl: 0 .  FLUoxetine (PROZAC) 40 MG capsule, Take 1 capsule (40 mg total) by mouth daily., Disp: 30 capsule, Rfl: 0 .  insulin aspart (NOVOLOG) 100 UNIT/ML injection, 35 units per insulin pump daily (  Patient taking differently: Inject 35 Units into the skin daily. insulin pump daily), Disp: 10 mL, Rfl: 11 .  levothyroxine (SYNTHROID) 100 MCG tablet, Take 1 tablet (100 mcg total) by mouth daily before breakfast., Disp: 30 tablet, Rfl: 0 .  montelukast (SINGULAIR) 10 MG tablet, Take 1 tablet (10 mg total) by mouth at bedtime., Disp: 30 tablet, Rfl: 0 .  nitroGLYCERIN (NITROSTAT) 0.4 MG SL tablet, Place 1 tablet (0.4 mg total) under the tongue every 5 (five) minutes as needed for chest pain., Disp: 30 tablet, Rfl: 0 .  ondansetron (ZOFRAN) 4 MG tablet, Take 1 tablet (4 mg total) by mouth every 8 (eight) hours as needed for nausea or vomiting., Disp: 20 tablet, Rfl: 0 .  pantoprazole (PROTONIX) 40 MG tablet, Take 1 tablet (40 mg total) by mouth daily., Disp: 30 tablet, Rfl: 0 .  [START ON 02/12/2018] clopidogrel (PLAVIX) 75 MG tablet, Take 1 tablet (75 mg total) by mouth daily with breakfast., Disp: 30 tablet, Rfl: 11 .  glucose blood (BAYER CONTOUR NEXT TEST) test strip,  Test blood glucose 2-3 times a day. Dx code: 250.01, Disp: 200 each, Rfl: 0 .  HYDROcodone-acetaminophen (NORCO) 5-325 MG tablet, Take 1 tablet by mouth every 4 (four) hours as needed (for pain). (Patient not taking: Reported on 02/08/2018), Disp: 20 tablet, Rfl: 0 .  isosorbide mononitrate (IMDUR) 30 MG 24 hr tablet, Take 0.5 tablets (15 mg total) by mouth daily., Disp: 30 tablet, Rfl: 11 .  metoprolol tartrate (LOPRESSOR) 25 MG tablet, Take 1 tablet (25 mg total) by mouth 2 (two) times daily. Hold for SBP<90 and HR<50, Disp: 60 tablet, Rfl: 11 .  rosuvastatin (CRESTOR) 40 MG tablet, Take 1 tablet (40 mg total) by mouth daily at 6 PM., Disp: 30 tablet, Rfl: 11

## 2018-02-11 NOTE — Research (Signed)
[facility]   DEMOGRAPHICS:  Patient Name: Jennifer Hogan Birth Date: 04/27/1970  Sex: Female Race: white  Child Bearing: ? Yes    ? No  Neg pregnancy test ? Tubial ligation ? Hysterectomy  ? postmenopausal   Height: 165 cm Weight: 65.7 kg   Index Procedure:  Onset date of symptoms: 17-Jan-20 Onset of symptoms: Click or tap to enter a date.  Date of First contact at hospital: 17-Jan-20 Time of first contact at hospital: [place]  Admission Date: 17-Jan-20   Discharge Date: 20-Jan-20 Discharge Time: 1401   Vital Signs: Date 02/08/2018    Time: 0354 BP: 143/106  Pulse: 119    Concomitant medications: Every visit: ? See med sheet  BMP Pre Contrast IV 02/08/2018 @ 0411  Creat 1.07  CMP Post Contrast IV 12 hours later: 02/11/2018 @ 0930 Creat 0.79 Hepatic Panel: 02/11/2018 @ 0930 ALT: 17 Total Bili: 0.9 Direct Bili: ND   Medical History:  ? CAD ? Prior MI ? PAD  ? History of Heart Failure ? Moderate to severe valvular dx ? AFib  ? Prior Coronary Revascularization  if YES please select Yes or No below:  CABG ? Yes   ? No           PCI with stent ? Yes   ? No          PCI without stent ? Yes ? No  ? CVA if checked please select one of the following Choose an item.  ? Hypertension  ? Gilberts syndrome ? CKD   ? Hypocholesteremia ? DM ? Smoker        ? eCigarette  [place] Class Stage 1     EQ-5D-3L ?  75  Future Biomedical Research: Consented ? Yes     ? No If no please date they withdrew consent from biomedical research Click or tap to enter a date.  Central Labs Before Start of Infusion: ? Biochemistry panel      ? Hematology     ? Immunogenicity   (30 mins before infusion)   ? Parvovirus  ? FBR sample ? PK/PD sample Central Labs End of Infusion:   ? PK/PD Central Blood Draw Time: Before SOI: 02/11/2018 @ 1115 After EOI: _01/20/2020 @_1325__  Infusion Start Time: 02/11/2018 11:22 AM  Infusion End Time: 02/11/2018 1:23 PM Vitals before infusion: 02/11/2018 @ 0900 BP 108/51 HR  113  EQ-5D-5L  MOBILITY:    I HAVE NO PROBLEMS WALKING [x]   I HAVE SLIGHT PROBLEMS WALKING []   I HAVE MODERATE PROBLEMS WALKING []   I HAVE SEVERE PROBLEMS WALKING []   I AM UNABLE TO WALK  []     SELF-CARE:   I HAVE NO PROBLEMS WASING OR DRESSING MYSELF  [x]   I HAVE SLIGHT PROBLEMS WASHING OR DRESSING MYSELF  []   I HAVE MODERATE PROBLEMS WASHING OR DRESSING MYSELF []   I HAVE SEVERE PROBLEMS WASHING OR DRESSING MYSELF  []   I HAVE SEVERE PROBLEMS WASHING OR DRESSING MYSELF  []   I AM UNABLE TO WASH OR DRESS MYSELF []     USUAL ACTIVITIES: (E.G. WORK/STUDY/HOUSEWORK/FAMILY OR LEISURE ACTIVITIES.    I HAVE NO PROBLEMS DOING MY USUAL ACTIVITIES [x]   I HAVE SLIGHT PROBLEMS DOING MY USUAL ACTIVITIES []   I HAVE MODERATE PROBLEMS DOING MY USUAL ACTIVIITIES []   I HAVE SEVERE PROBLEMS DOING MY USUAL ACTIVITIES []   I AM UNABLE TO DO MY USUAL ACTIVITIES []     PAIN /DISCOMFORT   I HAVE NO PAIN OR DISCOMFORT [x]   I  HAVE SLIGHT PAIN OR DISCOMFORT []   I HAVE MODERATE PAIN OR DISCOMFORT []   I HAVE SEVERE PAIN OR DISCOMFORT []   I HAVE EXTREME PAIN OR DISCOMFORT []     ANXIETY/DEPRESSION   I AM NOT ANXIOUS OR DEPRESSED [x]   I AM SLIGHTLY ANXIOUS OR DEPRESSED []   I AM MODERATELY ANXIOUS OR DREPRESSED []   I AM SEVERELY ANXIOUS OR DEPRESSED []   I AM EXTREMELY ANXIOUS OR DEPRESSED []     SCALE OF 0-100 HOW WOULD YOU RATE TODAY?  0 IS THE WORSE AND 100 IS THE BEST HEALTH YOU CAN IMAGINE: 75

## 2018-02-11 NOTE — Progress Notes (Addendum)
Progress Note  Patient Name: Jennifer Hogan Date of Encounter: 02/11/2018  Primary Cardiologist: Fransico Him, MD   Subjective   Pt doing well this AM. Cath site unremarkable. Denies chest pain or SOB.   Inpatient Medications    Scheduled Meds: . aspirin  81 mg Oral Daily  . clopidogrel  75 mg Oral Q breakfast  . FLUoxetine  50 mg Oral Daily  . heparin  5,000 Units Subcutaneous Q8H  . insulin pump   Subcutaneous Q4H  . isosorbide mononitrate  15 mg Oral Daily  . levothyroxine  100 mcg Oral Q0600  . metoprolol tartrate  25 mg Oral BID  . rosuvastatin  40 mg Oral q1800   Continuous Infusions: . calcium gluconate     PRN Meds: acetaminophen, levalbuterol, morphine injection, ondansetron (ZOFRAN) IV, oxyCODONE   Vital Signs    Vitals:   02/10/18 1407 02/10/18 1613 02/10/18 2110 02/11/18 0639  BP: 94/65 (!) 91/57 96/62 118/67  Pulse: 74  88 90  Resp: 16  16 16   Temp: 98.7 F (37.1 C) 98.3 F (36.8 C) 99.4 F (37.4 C) 98.4 F (36.9 C)  TempSrc: Oral Oral Oral Oral  SpO2: 99% 98% 96% 100%  Weight:    67.4 kg  Height:        Intake/Output Summary (Last 24 hours) at 02/11/2018 0731 Last data filed at 02/11/2018 9381 Gross per 24 hour  Intake 720 ml  Output 1200 ml  Net -480 ml   Filed Weights   02/08/18 0406 02/11/18 0639  Weight: 65.8 kg 67.4 kg    Physical Exam   General: Well developed, well nourished, NAD Skin: Warm, dry, intact  Head: Normocephalic, atraumatic, clear, moist mucus membranes. Neck: Negative for carotid bruits. No JVD Lungs:Clear to ausculation bilaterally. No wheezes, rales, or rhonchi. Breathing is unlabored. Cardiovascular: RRR with S1 S2. No murmurs, rubs, gallops, or LV heave appreciated. Abdomen: Soft, non-tender, non-distended with normoactive bowel sounds. No hepatomegaly, No rebound/guarding. No obvious abdominal masses. MSK: Strength and tone appear normal for age. 5/5 in all extremities Extremities: No edema. No  clubbing or cyanosis. DP/PT pulses 2+ bilaterally Neuro: Alert and oriented. No focal deficits. No facial asymmetry. MAE spontaneously. Psych: Responds to questions appropriately with normal affect.    Labs    Chemistry Recent Labs  Lab 02/09/18 0953 02/09/18 1410 02/10/18 0257  NA 135 136 136  K 4.1 3.5 3.3*  CL 111 108 107  CO2 16* 19* 20*  GLUCOSE 159* 161* 166*  BUN 8 5* <5*  CREATININE 0.73 0.60 0.62  CALCIUM 7.7* 7.9* 7.9*  GFRNONAA >60 >60 >60  GFRAA >60 >60 >60  ANIONGAP 8 9 9      Hematology Recent Labs  Lab 02/08/18 0411 02/08/18 1247 02/09/18 0221 02/10/18 0257  WBC 11.1*  --  22.7* 10.0  RBC 4.55  --  3.66* 3.53*  HGB 13.7  --  10.9* 10.3*  HCT 41.4  --  34.0* 32.3*  MCV 91.0  --  92.9 91.5  MCH 30.1  --  29.8 29.2  MCHC 33.1  --  32.1 31.9  RDW 13.7  --  14.6 14.2  PLT 580* 420* 442* 332    Cardiac Enzymes Recent Labs  Lab 02/08/18 1112 02/08/18 1516 02/08/18 2308 02/09/18 0221  TROPONINI 0.78* 0.74* 0.59* 0.47*    Recent Labs  Lab 02/08/18 0416  TROPIPOC 0.19*     BNPNo results for input(s): BNP, PROBNP in the last 168 hours.  DDimer No results for input(s): DDIMER in the last 168 hours.   Radiology    No results found.  Telemetry    02/11/18 - Personally Reviewed  ECG    No new tracing as of 02/11/18 - Personally Reviewed  Cardiac Studies   Cardiac cath 02/08/18  Previously placed Dist LM to Ost LAD drug eluting stent is widely patent.  Balloon angioplasty was performed.  Dist LAD lesion is 99% stenosed.  Balloon angioplasty was performed.  Previously placed Prox LAD drug eluting stent is widely patent.  Balloon angioplasty was performed.  Previously placed Mid LAD-1 drug eluting stent is widely patent.  Balloon angioplasty was performed.  Balloon angioplasty was performed.  Prox Cx lesion is 95% stenosed.  Mid Cx lesion is 60% stenosed.  1st Mrg lesion is 70% stenosed.  Prox LAD to Mid LAD lesion is  70% stenosed.  Mid LAD-2 lesion is 40% stenosed.  Ost 1st Mrg to 1st Mrg lesion is 35% stenosed.  Ost 1st Mrg lesion is 70% stenosed.  A stent was successfully placed.  Post intervention, there is a 0% residual stenosis.  Diagnostic  Dominance: Right    Intervention       Acute coronary syndrome with difficult to identify culprit in the setting of multifocal disease involving the LAD, diagonal, and circumflex coronary arteries.  System delay occurred in achieving revascularization due to atypical symptoms on presentation and time spent deciding upon treatment strategy after anatomy was defined.  Successful ostial to proximal circumflex stent from 95% to 0% with TIMI grade III flow using a 2.25 x 15 Onyx postdilated to 2.75 mm in diameter..  The mid circumflex contains eccentric focal 70% stenosis.  Widely patent left main  LAD is large before branching into a very large first diagonal and the continuation of the LAD.  A proximal and mid stent are noted in the LAD followed by an extremely long stent in the mid to distal LAD.  In the mid LAD which was not stented there is 75% stenosis.  The diagonal contains new concentric 70% ostial narrowing and a mid vessel stent with distal stent margin ISR up to 50%.  Beyond the stent there is segmental 80% stenosis.  Mild anteroapical hypokinesis.  EF 50%.  LVEDP was less than 10.  RECOMMENDATIONS:   Patient should probably have a surgical consultation to see if her targets would be adequate for surgical revascularization if symptoms continue.  If symptoms resolve, medical therapy for now and aggressive risk factor modification.  Discussed with team member, Dr. Quay Burow, who will be caring for the patient later today.   TTE: 02/08/2018  - Left ventricle: The cavity size was normal. Wall thickness was normal. Basal inferior akinesis. Basal septal hypokinesis. Systolic function was low normal to mildly reduced. The  estimated ejection fraction was in the range of 50% to 55%. Features are consistent with a pseudonormal left ventricular filling pattern, with concomitant abnormal relaxation and increased filling pressure (grade 2 diastolic dysfunction). - Aortic valve: There was no stenosis. - Mitral valve: There was trivial regurgitation. - Right ventricle: The cavity size was normal. Systolic function was normal. - Pulmonary arteries: No complete TR doppler jet so unable to estimate PA systolic pressure. - Inferior vena cava: The vessel was normal in size. The respirophasic diameter changes were in the normal range (>= 50%), consistent with normal central venous pressure.  Impressions:  - Normal LV size with EF 50-55%. Wall motion abnormalities as noted above. Normal RV  size and systolic function. No significant valvular abnormalities.  Patient Profile     48 y.o. female with hx of CAD and prior NSTEMI and PCI to large ramus and LAD, DM-1 on insulin pump, EF 45-50% on last echo, hx of orthostatic hypotension. She has been on ASA and plavix.   Assessment & Plan    1.  Acute NSTEMI with hx of CAD: -Pt with hx of CAD with severe three-vessel disease s/p PTCA DES x3 in the LAD(ostial DES placed, mid DES placed, distal DES placed) and PTCA/DES x1 to intermediate branch, 02/2017: -Per cath note, ACS with difficult identification of culprit lesion secondary to multifocal disease involving the LAD, diagonal and LCx coronary arteries with successful ostial to proximal circumflex stent placement 01/17/20120 -Peak troponin at 0.78 -Off NTG gtt, Imdur started 02/10/2018 -Denies recurrent chest pain -Continue metoprolol 25 mg twice daily -Plan for DAPT with ASA 81, Plavix 75, Imdur 15  2.  HLD: -Was started on high intensity statin with Crestor 40>>pt to be entered into clinical trail  -Pt willing to continue Crestor  -LDL, 177  3.  Hypothyroidism: -TSH WNL at 0.934  02/08/2018 -Continue Synthroid  4.  IDDM-1 on insulin pump with DKA: -Internal medicine following  5. Hypotension: -Stable however low normal, 118/67>96/62>91/57>94/65 -Monitor for symptoms, currently asymptomatic   -Had episode of significant symptomatic hypotension post cath with resolution   Signed, Kathyrn Drown NP-C [facility] Pager: [phone] 02/11/2018, 7:31 AM     For questions or updates, please contact   Please consult [email or web address] for contact info under Cardiology/STEMI.   Patient seen and examined and history reviewed. Agree with above findings and plan. Patient feels very well. Ambulating in halls. No angina. Discussed importance of lipid and diabetes control. She has aggressive CAD and if she has recurrent issues may need to be considered for CABG. Will continue Crestor 40 mg daily. She is enrolling in the [facility] trial. She is stable for DC today.  Peter Martinique, [place] 02/11/2018 10:14 AM

## 2018-02-12 NOTE — Telephone Encounter (Signed)
**Note De-Identified  Obfuscation** TCM 1st Attempt: I left a message asking the pt to call us back.

## 2018-02-12 NOTE — Telephone Encounter (Signed)
Pt scheduled to be seen tomorrow.

## 2018-02-13 ENCOUNTER — Ambulatory Visit (INDEPENDENT_AMBULATORY_CARE_PROVIDER_SITE_OTHER): Payer: No Typology Code available for payment source | Admitting: Endocrinology

## 2018-02-13 ENCOUNTER — Encounter: Payer: Self-pay | Admitting: Endocrinology

## 2018-02-13 VITALS — BP 100/58 | HR 76 | Ht 65.0 in | Wt 152.8 lb

## 2018-02-13 DIAGNOSIS — E108 Type 1 diabetes mellitus with unspecified complications: Secondary | ICD-10-CM

## 2018-02-13 LAB — POCT GLYCOSYLATED HEMOGLOBIN (HGB A1C): Hemoglobin A1C: 7.6 % — AB (ref 4.0–5.6)

## 2018-02-13 NOTE — Progress Notes (Signed)
Subjective:    [name], female    DOB: [birth year], 48 y.o.   MRN: [id number]  HPI Pt returns for f/u of diabetes mellitus:  DM type: 1  Dx'ed: 1941 Complications: CAD.   Therapy: insulin since dx.  DKA: never Severe hypoglycemia: never.  Pancreatitis: never Other: she has been on pump (medtronic paradigm) rx since 2014; she stopped continuous glucose monitor, due to cost; rx is limited by lack of health insurance; she is educator, and also caregiver for her parents.   Interval history: She takes these settings:   basal rate of 0.67 units/hr, 24 HRS per day (she suspends for activity).   mealtime bolus of 1 unit/18 grams of carbohydrate.   correction bolus (which some people call "sensitivity," or "insulin sensitivity ratio," or just "isr") of 1 unit for each 60 by which your glucose exceeds 100.  TDD is approx 35 units/day.   Meter is downloaded today, and the printout is scanned into the record.  cbg varies from 90-400.  It is in general lower after exercise  cbg's are mildly low approx twice per week (usually with activity).  There is no trend throughout the day.  Pt says she never misses the boluses.   Past Medical History:  Diagnosis Date  . Allergic state 08/07/2015  . Anxiety   . Coronary artery disease    03/17/17 - DES x 3 LAD, DES OM; 02/08/18 - balloon angioplasty x 4 and stent placement  . Depression   . Diabetes mellitus    type 1, on insulin pump  . DKA (diabetic ketoacidoses) ([place]) 12/31/2017  . High cholesterol   . Hypothyroidism   . Vomiting and diarrhea 12/31/2017    Past Surgical History:  Procedure Laterality Date  . APPENDECTOMY    . CORONARY STENT INTERVENTION N/A 03/20/2017   Procedure: DES x 3 LAD, DES OM; Surgeon: Burnell Blanks, MD;  Location: [facility];  Service: Cardiovascular;  Laterality: N/A;  . CORONARY/GRAFT ACUTE MI REVASCULARIZATION N/A 02/08/2018   Procedure: Coronary/Graft Acute MI Revascularization;   Surgeon: Belva Crome, MD;  Location: [facility];  Service: Cardiovascular;  Laterality: N/A;  . LEFT HEART CATH AND CORONARY ANGIOGRAPHY N/A 03/20/2017   Procedure: LEFT HEART CATH AND CORONARY ANGIOGRAPHY;  Surgeon: Burnell Blanks, MD;  Location: [facility];  Service: Cardiovascular;  Laterality: N/A;  . LEFT HEART CATH AND CORONARY ANGIOGRAPHY N/A 02/08/2018   Procedure: LEFT HEART CATH AND CORONARY ANGIOGRAPHY;  Surgeon: Belva Crome, MD;  Location: [facility];  Service: Cardiovascular;  Laterality: N/A;    Social History   Socioeconomic History  . Marital status: Divorced    Spouse name: Not on file  . Number of children: Not on file  . Years of education: Not on file  . Highest education level: Not on file  Occupational History  . 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 Financial resource strain: Somewhat hard  . Food insecurity:    Worry: Never true    Inability: Never true  . Transportation needs:    Medical: No    Non-medical: No  Tobacco Use  . Smoking status: Never Smoker  . Smokeless tobacco: Never Used  . Tobacco comment: Use marijuana  Substance and Sexual Activity  . Alcohol use: No    Alcohol/week: 0.0 standard drinks  . Drug use: Yes    Types: Marijuana    Comment: last  use last week, occaisonal marijuana use  . Sexual activity: Not Currently    Birth control/protection: None  Lifestyle  . Physical activity:    Days per week: Not on file    Minutes per session: Not on file  . Stress: Not on file  Relationships  . Social connections:    Talks on phone: Patient refused    Gets together: Patient refused    Attends religious service: Patient refused    Active member of club or organization: Patient refused    Attends meetings of clubs or organizations: Patient refused    Relationship status: Patient refused  . Intimate partner violence:    Fear of current or ex partner: No    Emotionally abused: No     Physically abused: No    Forced sexual activity: No  Other Topics Concern  . Not on file  Social History Narrative  . Not on file    No current facility-administered medications on file prior to visit.    Current Outpatient Medications on File Prior to Visit  Medication Sig Dispense Refill  . aspirin 81 MG EC tablet Take 1 tablet (81 mg total) by mouth daily. 90 tablet 0  . clopidogrel (PLAVIX) 75 MG tablet Take 1 tablet (75 mg total) by mouth daily with breakfast. 30 tablet 11  . HYDROcodone-acetaminophen (NORCO) 5-325 MG tablet Take 1 tablet by mouth every 4 (four) hours as needed (for pain). (Patient not taking: Reported on 02/14/2018) 20 tablet 0  . insulin aspart (NOVOLOG) 100 UNIT/ML injection 35 units per insulin pump daily (Patient taking differently: Inject 35 Units into the skin daily. insulin pump daily) 10 mL 11  . isosorbide mononitrate (IMDUR) 30 MG 24 hr tablet Take 0.5 tablets (15 mg total) by mouth daily. 30 tablet 11  . metoprolol tartrate (LOPRESSOR) 25 MG tablet Take 1 tablet (25 mg total) by mouth 2 (two) times daily. Hold for SBP<90 and HR<50 60 tablet 11  . nitroGLYCERIN (NITROSTAT) 0.4 MG SL tablet Place 1 tablet (0.4 mg total) under the tongue every 5 (five) minutes as needed for chest pain. 30 tablet 0  . ondansetron (ZOFRAN) 4 MG tablet Take 1 tablet (4 mg total) by mouth every 8 (eight) hours as needed for nausea or vomiting. 20 tablet 0  . pantoprazole (PROTONIX) 40 MG tablet Take 1 tablet (40 mg total) by mouth daily. 30 tablet 0  . rosuvastatin (CRESTOR) 40 MG tablet Take 1 tablet (40 mg total) by mouth daily at 6 PM. 30 tablet 11  . albuterol (PROVENTIL HFA;VENTOLIN HFA) 108 (90 Base) MCG/ACT inhaler Inhale 2 puffs into the lungs every 6 (six) hours as needed for wheezing or shortness of breath. 1 Inhaler 0  . FLUoxetine (PROZAC) 10 MG capsule Take 1 capsule (10 mg total) by mouth daily. 30 capsule 0  . FLUoxetine (PROZAC) 40 MG capsule Take 1 capsule (40 mg  total) by mouth daily. 30 capsule 0  . levothyroxine (SYNTHROID) 100 MCG tablet Take 1 tablet (100 mcg total) by mouth daily before breakfast. 30 tablet 0  . montelukast (SINGULAIR) 10 MG tablet Take 1 tablet (10 mg total) by mouth at bedtime. 30 tablet 0    Allergies  Allergen Reactions  . Ciprofloxin Hcl [Ciprofloxacin] Nausea And Vomiting and Other (See Comments)    Severe migraine  . Food Color Red [Red Dye] Diarrhea    Family History  Problem Relation Age of Onset  . Coronary artery disease Father   . Congestive Heart  Failure Father   . Asthma Father   . Cancer Mother        T cell lymphoma    BP (!) 100/58 (BP Location: Right Arm, Patient Position: Sitting, Cuff Size: Normal)   Pulse 76   Ht 5\' 5"  (1.651 m)   Wt 152 lb 12.8 oz (69.3 kg)   LMP 01/24/2018   SpO2 99%   BMI 25.43 kg/m    Review of Systems Denies LOC    Objective:   Physical Exam VITAL SIGNS:  See vs page GENERAL: no distress Pulses: dorsalis pedis intact bilat.   MSK: no deformity of the feet CV: no leg edema Skin:  no ulcer on the feet.  normal color and temp on the feet.  Neuro: sensation is intact to touch on the feet.    Lab Results  Component Value Date   HGBA1C 7.6 (A) 02/13/2018    Lab Results  Component Value Date   CHOL 290 (H) 02/08/2018   HDL 92 02/08/2018   LDLCALC 177 (H) 02/08/2018   TRIG 107 02/08/2018   CHOLHDL 3.2 02/08/2018   Lab Results  Component Value Date   TSH 0.934 02/08/2018      Assessment & Plan:  Type 1 DM, with CAD: this is the best control this pt should aim for, given variable cbg's.  MI, new: as this was recent, she should avoid hypoglycemia. Dyslipidemia: f/u with PCP.  Patient Instructions  check your blood sugar 8 times a day.  vary the time of day when you check, between before the 3 meals, and at bedtime.  also check if you have symptoms of your blood sugar being too high or too low.  please keep a record of the readings and bring it to your  next appointment here.  please call us sooner if your blood sugar goes below 70, or if you have a lot of readings over 200.   Please continue these pump settings:  basal rate of 0.65 units/hr, 24 HRS per day.  Suspend for activity.   mealtime bolus of 1 unit/17 grams of carbohydrate.   correction bolus (which some people call "sensitivity," or "insulin sensitivity ratio," or just "isr") of 1 unit for each 60 by which your glucose exceeds 100.   Please come back for a follow-up appointment in 2 months.

## 2018-02-13 NOTE — Patient Instructions (Addendum)
check your blood sugar 8 times a day.  vary the time of day when you check, between before the 3 meals, and at bedtime.  also check if you have symptoms of your blood sugar being too high or too low.  please keep a record of the readings and bring it to your next appointment here.  please call us sooner if your blood sugar goes below 70, or if you have a lot of readings over 200.   Please continue these pump settings:  basal rate of 0.65 units/hr, 24 HRS per day.  Suspend for activity.   mealtime bolus of 1 unit/17 grams of carbohydrate.   correction bolus (which some people call "sensitivity," or "insulin sensitivity ratio," or just "isr") of 1 unit for each 60 by which your glucose exceeds 100.   Please come back for a follow-up appointment in 2 months.

## 2018-02-13 NOTE — Telephone Encounter (Signed)
No answer. Ringing phone. No VM.

## 2018-02-14 ENCOUNTER — Emergency Department (HOSPITAL_COMMUNITY): Payer: No Typology Code available for payment source

## 2018-02-14 ENCOUNTER — Encounter (HOSPITAL_COMMUNITY): Payer: Self-pay | Admitting: Emergency Medicine

## 2018-02-14 ENCOUNTER — Observation Stay (HOSPITAL_COMMUNITY)
Admission: EM | Admit: 2018-02-14 | Discharge: 2018-02-16 | Disposition: A | Discharge status: Home / Self Care | Payer: No Typology Code available for payment source | Attending: Family Medicine | Admitting: Family Medicine

## 2018-02-14 ENCOUNTER — Other Ambulatory Visit: Payer: Self-pay

## 2018-02-14 DIAGNOSIS — R739 Hyperglycemia, unspecified: Secondary | ICD-10-CM

## 2018-02-14 DIAGNOSIS — E785 Hyperlipidemia, unspecified: Secondary | ICD-10-CM | POA: Insufficient documentation

## 2018-02-14 DIAGNOSIS — Z9641 Presence of insulin pump (external) (internal): Secondary | ICD-10-CM | POA: Insufficient documentation

## 2018-02-14 DIAGNOSIS — E1043 Type 1 diabetes mellitus with diabetic autonomic (poly)neuropathy: Secondary | ICD-10-CM | POA: Insufficient documentation

## 2018-02-14 DIAGNOSIS — I252 Old myocardial infarction: Secondary | ICD-10-CM | POA: Insufficient documentation

## 2018-02-14 DIAGNOSIS — F32A Depression, unspecified: Secondary | ICD-10-CM | POA: Diagnosis present

## 2018-02-14 DIAGNOSIS — F419 Anxiety disorder, unspecified: Secondary | ICD-10-CM | POA: Insufficient documentation

## 2018-02-14 DIAGNOSIS — K3184 Gastroparesis: Secondary | ICD-10-CM | POA: Insufficient documentation

## 2018-02-14 DIAGNOSIS — Z79890 Hormone replacement therapy (postmenopausal): Secondary | ICD-10-CM | POA: Insufficient documentation

## 2018-02-14 DIAGNOSIS — Z7982 Long term (current) use of aspirin: Secondary | ICD-10-CM | POA: Insufficient documentation

## 2018-02-14 DIAGNOSIS — F329 Major depressive disorder, single episode, unspecified: Secondary | ICD-10-CM | POA: Insufficient documentation

## 2018-02-14 DIAGNOSIS — E039 Hypothyroidism, unspecified: Secondary | ICD-10-CM | POA: Insufficient documentation

## 2018-02-14 DIAGNOSIS — Z7902 Long term (current) use of antithrombotics/antiplatelets: Secondary | ICD-10-CM | POA: Insufficient documentation

## 2018-02-14 DIAGNOSIS — Z79899 Other long term (current) drug therapy: Secondary | ICD-10-CM | POA: Insufficient documentation

## 2018-02-14 DIAGNOSIS — Z955 Presence of coronary angioplasty implant and graft: Secondary | ICD-10-CM | POA: Insufficient documentation

## 2018-02-14 DIAGNOSIS — R1013 Epigastric pain: Secondary | ICD-10-CM

## 2018-02-14 DIAGNOSIS — R112 Nausea with vomiting, unspecified: Principal | ICD-10-CM | POA: Insufficient documentation

## 2018-02-14 DIAGNOSIS — Z794 Long term (current) use of insulin: Secondary | ICD-10-CM | POA: Insufficient documentation

## 2018-02-14 DIAGNOSIS — E1065 Type 1 diabetes mellitus with hyperglycemia: Secondary | ICD-10-CM | POA: Insufficient documentation

## 2018-02-14 DIAGNOSIS — I2510 Atherosclerotic heart disease of native coronary artery without angina pectoris: Secondary | ICD-10-CM | POA: Insufficient documentation

## 2018-02-14 LAB — COMPREHENSIVE METABOLIC PANEL
ALT: 29 U/L (ref 0–44)
AST: 37 U/L (ref 15–41)
Albumin: 3.9 g/dL (ref 3.5–5.0)
Alkaline Phosphatase: 90 U/L (ref 38–126)
Anion gap: 16 — ABNORMAL HIGH (ref 5–15)
BUN: 9 mg/dL (ref 6–20)
CO2: 21 mmol/L — ABNORMAL LOW (ref 22–32)
Calcium: 9.1 mg/dL (ref 8.9–10.3)
Chloride: 99 mmol/L (ref 98–111)
Creatinine, Ser: 0.87 mg/dL (ref 0.44–1.00)
GFR calc Af Amer: >60 mL/min (ref >60)
GFR calc non Af Amer: >60 mL/min (ref >60)
Glucose, Bld: 311 mg/dL — ABNORMAL HIGH (ref 70–99)
POTASSIUM: 3.7 mmol/L (ref 3.5–5.1)
Sodium: 136 mmol/L (ref 135–145)
Total Bilirubin: 1.5 mg/dL — ABNORMAL HIGH (ref 0.3–1.2)
Total Protein: 7.2 g/dL (ref 6.5–8.1)

## 2018-02-14 LAB — URINALYSIS, ROUTINE W REFLEX MICROSCOPIC
Bacteria, UA: NONE SEEN
Bilirubin Urine: NEGATIVE
Glucose, UA: >=500 mg/dL — AB
Hgb urine dipstick: NEGATIVE
Ketones, ur: 20 mg/dL — AB
Leukocytes, UA: NEGATIVE
Nitrite: NEGATIVE
PROTEIN: NEGATIVE mg/dL
Specific Gravity, Urine: 1.01 (ref 1.005–1.030)
pH: 7 (ref 5.0–8.0)

## 2018-02-14 LAB — CBC WITH DIFFERENTIAL/PLATELET
Abs Immature Granulocytes: 0.06 10*3/uL (ref 0.00–0.07)
Basophils Absolute: 0 10*3/uL (ref 0.0–0.1)
Basophils Relative: 0 %
EOS ABS: 0.1 10*3/uL (ref 0.0–0.5)
Eosinophils Relative: 1 %
HCT: 34.5 % — ABNORMAL LOW (ref 36.0–46.0)
Hemoglobin: 11.1 g/dL — ABNORMAL LOW (ref 12.0–15.0)
Immature Granulocytes: 1 %
Lymphocytes Relative: 11 %
Lymphs Abs: 1.3 10*3/uL (ref 0.7–4.0)
MCH: 29.9 pg (ref 26.0–34.0)
MCHC: 32.2 g/dL (ref 30.0–36.0)
MCV: 93 fL (ref 80.0–100.0)
Monocytes Absolute: 0.3 10*3/uL (ref 0.1–1.0)
Monocytes Relative: 2 %
Neutro Abs: 9.8 10*3/uL — ABNORMAL HIGH (ref 1.7–7.7)
Neutrophils Relative %: 85 %
PLATELETS: 452 10*3/uL — AB (ref 150–400)
RBC: 3.71 MIL/uL — AB (ref 3.87–5.11)
RDW: 14.5 % (ref 11.5–15.5)
WBC: 11.5 10*3/uL — ABNORMAL HIGH (ref 4.0–10.5)
nRBC: 0 % (ref 0.0–0.2)

## 2018-02-14 LAB — TROPONIN I
TROPONIN I: 0.09 ng/mL — AB (ref <0.03)
Troponin I: 0.1 ng/mL (ref <0.03)

## 2018-02-14 LAB — LIPASE, BLOOD: Lipase: 21 U/L (ref 11–51)

## 2018-02-14 LAB — CBG MONITORING, ED: Glucose-Capillary: 224 mg/dL — ABNORMAL HIGH (ref 70–99)

## 2018-02-14 MED ORDER — LIDOCAINE VISCOUS HCL 2 % MT SOLN
15.0000 mL | Freq: Once | OROMUCOSAL | Status: AC
  Administered 2018-02-14: 15 mL via ORAL
  Filled 2018-02-14: qty 15

## 2018-02-14 MED ORDER — SODIUM CHLORIDE 0.9 % IV BOLUS
1000.0000 mL | Freq: Once | INTRAVENOUS | Status: AC
  Administered 2018-02-14: 1000 mL via INTRAVENOUS

## 2018-02-14 MED ORDER — METOCLOPRAMIDE HCL 10 MG PO TABS: 10.0000 mg | ORAL_TABLET | Freq: Three times a day (TID) | ORAL | 0 refills | Status: DC | PRN

## 2018-02-14 MED ORDER — ALUM & MAG HYDROXIDE-SIMETH 200-200-20 MG/5ML PO SUSP
30.0000 mL | Freq: Once | ORAL | Status: AC
  Administered 2018-02-14: 30 mL via ORAL
  Filled 2018-02-14: qty 30

## 2018-02-14 MED ORDER — PROMETHAZINE HCL 25 MG/ML IJ SOLN
12.5000 mg | Freq: Once | INTRAMUSCULAR | Status: AC
  Administered 2018-02-14: 12.5 mg via INTRAVENOUS
  Filled 2018-02-14: qty 1

## 2018-02-14 MED ORDER — METOCLOPRAMIDE HCL 5 MG/ML IJ SOLN
10.0000 mg | Freq: Once | INTRAMUSCULAR | Status: AC
  Administered 2018-02-14: 10 mg via INTRAVENOUS
  Filled 2018-02-14: qty 2

## 2018-02-14 NOTE — ED Triage Notes (Signed)
To ED via [facility] from home with chest pain- had NStemi last week with stents placed. Pt took 2 NTG at home, EMS gave pt 1 Ntg AND 81MG  BABY asa. Zofran 4mg  given IV also

## 2018-02-14 NOTE — ED Notes (Signed)
ED Provider at bedside. 

## 2018-02-14 NOTE — H&P (Signed)
History and Physical    STEPHANNE Hogan YBO:[id number] DOB: 1970-04-23 DOA: 02/14/2018  PCP: Jennifer Pounds, NP  Patient coming from: Home  I have personally briefly reviewed patient's old medical records in [place]  Chief Complaint: abdominal pain, nausea, vomiting  HPI: Jennifer Hogan is a 48 y.o. female with medical history significant for T1DM on insulin pump, CAD s/p DES to large ramus and LAD (02/2017) and recent STEMI with PTCA/DES to proximal/ostial circumflex 02/08/2018, Hypothyroidism, HLD, and Depression/Anxiety who presents to the ED with acute onset of abdominal pain, nausea, and vomiting.  Patient states she was doing well after recent hospitalization until earlier today when she had acute onset of abdominal pain, nausea, and vomiting after eating chicken and broccoli soup.  She reports a intense substernal burning sensation without radiation and associated diaphoresis and palpitations.  She says this is similar but milder to previous episodes when her blood sugar gets high including when she presented to the ED on 02/08/2018 and admitted for STEMI.  ED Course:  Initial vitals showed BP 185/92, pulse 88, RR 11, temp 98.6 Fahrenheit, SPO2 99% on room air.  Labs notable for WBC 11.5, hemoglobin 11.1, platelets 452, Na 136, K 3.7, bicarb 21, serum glucose 311, anion gap 16, BUN 9, creatinine 0.87, lipase 21.  Urinalysis with >500 glucose, 20 ketones, negative for UTI.  Troponin I 0.09 >> 0.10. EKG shows normal sinus rhythm, rate 96 bpm, biatrial enlargement, TWI V1-V2. TWI V2 new compared to prior and more pronounced in V1 from prior.  Portable chest x-ray showed hyperexpanded lung fields without focal consolidation, effusion, or edema.  Patient was given 1 L normal saline, GI cocktail, IV Reglan and IV phenergan with mild improvement.  The hospital service was consulted to admit for further management.   Review of Systems: As per HPI otherwise 10 point review of  systems negative.    Past Medical History:  Diagnosis Date  . Allergic state 08/07/2015  . Anxiety   . Coronary artery disease    03/17/17 - DES x 3 LAD, DES OM; 02/08/18 - balloon angioplasty x 4 and stent placement  . Depression   . Diabetes mellitus    type 1, on insulin pump  . DKA (diabetic ketoacidoses) ([place]) 12/31/2017  . High cholesterol   . Hypothyroidism   . Vomiting and diarrhea 12/31/2017    Past Surgical History:  Procedure Laterality Date  . APPENDECTOMY    . CORONARY STENT INTERVENTION N/A 03/20/2017   Procedure: DES x 3 LAD, DES OM; Surgeon: Burnell Blanks, MD;  Location: [facility];  Service: Cardiovascular;  Laterality: N/A;  . CORONARY/GRAFT ACUTE MI REVASCULARIZATION N/A 02/08/2018   Procedure: Coronary/Graft Acute MI Revascularization;  Surgeon: Belva Crome, MD;  Location: [facility];  Service: Cardiovascular;  Laterality: N/A;  . LEFT HEART CATH AND CORONARY ANGIOGRAPHY N/A 03/20/2017   Procedure: LEFT HEART CATH AND CORONARY ANGIOGRAPHY;  Surgeon: Burnell Blanks, MD;  Location: [facility];  Service: Cardiovascular;  Laterality: N/A;  . LEFT HEART CATH AND CORONARY ANGIOGRAPHY N/A 02/08/2018   Procedure: LEFT HEART CATH AND CORONARY ANGIOGRAPHY;  Surgeon: Belva Crome, MD;  Location: [facility];  Service: Cardiovascular;  Laterality: N/A;     reports that she has never smoked. She has never used smokeless tobacco. She reports current drug use. Drug: Marijuana. She reports that she does not drink alcohol.  Allergies  Allergen Reactions  . Ciprofloxin  Hcl [Ciprofloxacin] Nausea And Vomiting and Other (See Comments)    Severe migraine  . Food Color Red [Red Dye] Diarrhea    Family History  Problem Relation Age of Onset  . Coronary artery disease Father   . Congestive Heart Failure Father   . Asthma Father   . Cancer Mother        T cell lymphoma     Prior to Admission medications   Medication Sig  Start Date End Date Taking? Authorizing Provider  aspirin 81 MG EC tablet Take 1 tablet (81 mg total) by mouth daily. 12/07/17  Yes Amin, Jeanella Flattery, MD  clopidogrel (PLAVIX) 75 MG tablet Take 1 tablet (75 mg total) by mouth daily with breakfast. 02/12/18  Yes Kathyrn Drown D, NP  FLUoxetine (PROZAC) 10 MG capsule Take 1 capsule (10 mg total) by mouth daily. 12/07/17 02/14/18 Yes Amin, Jeanella Flattery, MD  FLUoxetine (PROZAC) 40 MG capsule Take 1 capsule (40 mg total) by mouth daily. 12/07/17 02/14/18 Yes Amin, Ankit Chirag, MD  insulin aspart (NOVOLOG) 100 UNIT/ML injection 35 units per insulin pump daily Patient taking differently: Inject 35 Units into the skin daily. insulin pump daily 10/31/17  Yes Renato Shin, MD  isosorbide mononitrate (IMDUR) 30 MG 24 hr tablet Take 0.5 tablets (15 mg total) by mouth daily. 02/11/18  Yes Kathyrn Drown D, NP  metoprolol tartrate (LOPRESSOR) 25 MG tablet Take 1 tablet (25 mg total) by mouth 2 (two) times daily. Hold for SBP<90 and HR<50 02/11/18  Yes Kathyrn Drown D, NP  montelukast (SINGULAIR) 10 MG tablet Take 1 tablet (10 mg total) by mouth at bedtime. 12/07/17 02/14/18 Yes Amin, Jeanella Flattery, MD  nitroGLYCERIN (NITROSTAT) 0.4 MG SL tablet Place 1 tablet (0.4 mg total) under the tongue every 5 (five) minutes as needed for chest pain. 12/07/17  Yes Amin, Ankit Chirag, MD  ondansetron (ZOFRAN) 4 MG tablet Take 1 tablet (4 mg total) by mouth every 8 (eight) hours as needed for nausea or vomiting. 01/02/18  Yes Ghimire, Henreitta Leber, MD  pantoprazole (PROTONIX) 40 MG tablet Take 1 tablet (40 mg total) by mouth daily. 12/07/17  Yes Amin, Jeanella Flattery, MD  rosuvastatin (CRESTOR) 40 MG tablet Take 1 tablet (40 mg total) by mouth daily at 6 PM. 02/11/18  Yes Kathyrn Drown D, NP  albuterol (PROVENTIL HFA;VENTOLIN HFA) 108 (90 Base) MCG/ACT inhaler Inhale 2 puffs into the lungs every 6 (six) hours as needed for wheezing or shortness of breath. 12/07/17 02/08/18  Amin, Jeanella Flattery, MD  HYDROcodone-acetaminophen (NORCO) 5-325 MG tablet Take 1 tablet by mouth every 4 (four) hours as needed (for pain). Patient not taking: Reported on 02/14/2018 12/07/17   Damita Lack, MD  levothyroxine (SYNTHROID) 100 MCG tablet Take 1 tablet (100 mcg total) by mouth daily before breakfast. 12/07/17 02/08/18  Amin, Jeanella Flattery, MD  metoCLOPramide (REGLAN) 10 MG tablet Take 1 tablet (10 mg total) by mouth every 8 (eight) hours as needed for nausea. 02/14/18   Davonna Belling, MD    Physical Exam: Vitals:   02/14/18 2230 02/14/18 2245 02/14/18 2300 02/15/18 0100  BP: (!) 163/92  (!) 164/98   Pulse: (!) 121 (!) 123 (!) 121   Resp: 16 17 (!) 22   Temp:      TempSrc:      SpO2: 99% 96% 98%   Weight:    65.2 kg  Height:    5\' 5"  (1.651 m)    Constitutional: Resting supine in bed, NAD,  calm, somewhat uncomfortable Eyes: PERRL, lids and conjunctivae normal ENMT: Mucous membranes are dry. Posterior pharynx clear of any exudate or lesions.Normal dentition.  Neck: normal, supple, no masses. Respiratory: clear to auscultation bilaterally, no wheezing, no crackles. Normal respiratory effort. No accessory muscle use.  Cardiovascular: Regular rate and rhythm, no murmurs / rubs / gallops. No extremity edema. Abdomen: Generalized tenderness to palpation, no masses palpated. No hepatosplenomegaly. Bowel sounds hypoactive.  Musculoskeletal: no clubbing / cyanosis. No joint deformity upper and lower extremities. Good ROM, no contractures. Normal muscle tone.  Skin: no rashes, lesions, ulcers. No induration Neurologic: CN 2-12 grossly intact. Sensation intact, Strength 5/5 in all 4.  Psychiatric: Normal judgment and insight. Alert and oriented x 3. Normal mood.     Labs on Admission: I have personally reviewed following labs and imaging studies  CBC: Recent Labs  Lab 02/08/18 0411 02/08/18 1247 02/09/18 0221 02/10/18 0257 02/14/18 1554  WBC 11.1*  --  22.7* 10.0 11.5*    NEUTROABS  --   --   --   --  9.8*  HGB 13.7  --  10.9* 10.3* 11.1*  HCT 41.4  --  34.0* 32.3* 34.5*  MCV 91.0  --  92.9 91.5 93.0  PLT 580* 420* 442* 332 263*   Basic Metabolic Panel: Recent Labs  Lab 02/09/18 0221 02/09/18 0646 02/09/18 0953 02/09/18 1410 02/10/18 0257 02/11/18 0930 02/14/18 1554  NA 136  --  135 136 136 139 136  K 4.0  --  4.1 3.5 3.3* 4.5 3.7  CL 109  --  111 108 107 106 99  CO2 11*  --  16* 19* 20* 23 21*  GLUCOSE 194*  --  159* 161* 166* 285* 311*  BUN 11  --  8 5* <5* 7 9  CREATININE 0.79  --  0.73 0.60 0.62 0.79 0.87  CALCIUM 7.9*  --  7.7* 7.9* 7.9* 8.4* 9.1  MG 2.2 2.2 2.0 2.0 2.0  --   --    GFR: Estimated Creatinine Clearance: 70.4 mL/min (by C-G formula based on SCr of 0.87 mg/dL). Liver Function Tests: Recent Labs  Lab 02/11/18 0930 02/14/18 1554  AST 25 37  ALT 17 29  ALKPHOS 70 90  BILITOT 0.9 1.5*  PROT 5.9* 7.2  ALBUMIN 2.9* 3.9   Recent Labs  Lab 02/14/18 1554  LIPASE 21   No results for input(s): AMMONIA in the last 168 hours. Coagulation Profile: Recent Labs  Lab 02/08/18 0522  INR 1.05   Cardiac Enzymes: Recent Labs  Lab 02/08/18 1516 02/08/18 2308 02/09/18 0221 02/14/18 1554 02/14/18 1955  TROPONINI 0.74* 0.59* 0.47* 0.09* 0.10*   BNP (last 3 results) No results for input(s): PROBNP in the last 8760 hours. HbA1C: Recent Labs    02/13/18 1327  HGBA1C 7.6*   CBG: Recent Labs  Lab 02/11/18 0413 02/11/18 0750 02/11/18 1134 02/14/18 2316 02/15/18 0143  GLUCAP 136* 123* 277* 224* 302*   Lipid Profile: No results for input(s): CHOL, HDL, LDLCALC, TRIG, CHOLHDL, LDLDIRECT in the last 72 hours. Thyroid Function Tests: No results for input(s): TSH, T4TOTAL, FREET4, T3FREE, THYROIDAB in the last 72 hours. Anemia Panel: No results for input(s): VITAMINB12, FOLATE, FERRITIN, TIBC, IRON, RETICCTPCT in the last 72 hours. Urine analysis:    Component Value Date/Time   COLORURINE COLORLESS (A) 02/14/2018  1704   APPEARANCEUR CLEAR 02/14/2018 1704   LABSPEC 1.010 02/14/2018 1704   PHURINE 7.0 02/14/2018 1704   GLUCOSEU >=500 (A) 02/14/2018 1704  GLUCOSEU NEGATIVE 11/23/2015 0831   HGBUR NEGATIVE 02/14/2018 1704   BILIRUBINUR NEGATIVE 02/14/2018 1704   BILIRUBINUR neg 12/07/2015 0843   KETONESUR 20 (A) 02/14/2018 [place] 02/14/2018 1704   UROBILINOGEN 1.0 12/07/2015 0843   UROBILINOGEN 0.2 11/23/2015 0831   NITRITE NEGATIVE 02/14/2018 1704   LEUKOCYTESUR NEGATIVE 02/14/2018 1704    Radiological Exams on Admission: Dg Chest Portable 1 View  Result Date: 02/14/2018 CLINICAL DATA:  Diffuse abdominal pain and vomiting since 11 a.m. today. Previous coronary artery stents. History of marijuana abuse. EXAM: PORTABLE CHEST 1 VIEW COMPARISON:  02/08/2018. FINDINGS: Normal sized heart. Coronary artery stents. Clear lungs. The lungs appear mildly hyperexpanded. Calcifications/ossification in the region of the distal rotator cuffs bilaterally. Minimal bilateral glenohumeral joint degenerative changes. IMPRESSION: No acute abnormality. Mild hyperexpansion of the lungs, suggesting early changes of COPD. Electronically Signed   By: Claudie Revering M.D.   On: 02/14/2018 16:06    EKG: Independently reviewed. EKG shows normal sinus rhythm, rate 96 bpm, biatrial enlargement, TWI V1-V2. TWI V2 new compared to prior and more pronounced in V1 from prior.  Assessment/Plan Principal Problem:   Non-intractable vomiting with nausea Active Problems:   Depression   Hypothyroidism   Coronary artery disease   S/P drug eluting coronary stent placement   Hyperglycemia due to type 1 diabetes mellitus ([place])   Hyperlipidemia  NHYIRA LEANO is a 48 y.o. female with medical history significant for T1DM on insulin pump, CAD s/p DES to large ramus and LAD (02/2017) and recent STEMI with PTCA/DES to proximal/ostial circumflex 02/08/2018, Hypothyroidism, HLD, and Depression/Anxiety who presents to the ED  with acute onset of abdominal pain, nausea, and vomiting.   Nausea, vomiting, abdominal pain: Suspect secondary to hyperglycemia and potential underlying gastroparesis.  Had some improvement with IV antiemetics. -Continue IV Reglan every 8 hours -N.p.o. except sips with meds, advance as tolerated -Maintenance IV fluids overnight  CAD s/p DES to large ramus and LAD (02/2017) and recent STEMI with PTCA/DES to proximal/ostial circumflex 02/08/2018: Patient reports epigastric to substernal burning sensation without typical cardiac symptoms, however reports similar symptoms prior to her recent admission for STEMI.  Discussed with on-call cardiology fellow, Dr. [place] Lions, due to concern for atypical presentation diabetic patient with recent stent placement.  Initial troponin currently flat 0.09 and 0.10.  Recommended continuing current management and monitoring troponin for further significant changes. -Continue aspirin and Plavix -Continue Lopressor and Imdur -Continue Crestor -Repeat troponin, will continue to trend if rising  Type 1 diabetes with hyperglycemia: On chronic insulin pump, A1c 7.6 on 02/13/2018.  Has elevated anion gap with mild ketones in urine but not overtly in DKA.  Repeat CBG improved to 224. -Continue insulin pump, monitor CBGs while n.p.o.  Hypothyroidism:  -Continue Synthroid  Prolonged QTC: -Monitor on telemetry.  Hyperlipidemia: -Continue Crestor.  Depression/anxiety: -Continue fluoxetine   DVT prophylaxis: Lovenox Code Status: Full code, confirmed with patient Family Communication: None present at bedside on admission Disposition Plan: Pending improvement in nausea, vomiting and ability to tolerate adequate oral intake Consults called: Discussed with on-call cardiology fellow by phone, not officially consulted Admission status: Observation   Zada Finders MD [facility] Pager [phone]  If 7PM-7AM, please contact  night-coverage [email or web address]  02/15/2018, 1:48 AM

## 2018-02-14 NOTE — ED Provider Notes (Signed)
[facility]PART[facility] Provider Note   CSN: [id number] Arrival date & time: 02/14/18  1538     History   Chief Complaint Chief Complaint  Patient presents with  . Chest Pain    HPI Jennifer Hogan is a 48 y.o. female.  HPI Has been a positive a lot and of this 1 patient presents with chest pain.  Began earlier today.  Has had nausea and vomiting and pain which she points over her whole abdomen and chest.  States she is vomited.  Has had dry heaves.  States she vomited up a little bit of blood.  Has had history of gastroparesis but also have history of STEMI including getting stents under a week ago.  Had 3 stents at that time.  Had 3 nitroglycerin without relief.  Had Zofran by EMS without relief to reportedly vomit up only little bit of blood.  Has not had any blood in the stool or black stools. Past Medical History:  Diagnosis Date  . Allergic state 08/07/2015  . Anxiety   . Coronary artery disease    03/17/17 - DES x 3 LAD, DES OM; 02/08/18 - balloon angioplasty x 4 and stent placement  . Depression   . Diabetes mellitus    type 1, on insulin pump  . DKA (diabetic ketoacidoses) ([place]) 12/31/2017  . High cholesterol   . Hypothyroidism   . Vomiting and diarrhea 12/31/2017    Patient Active Problem List   Diagnosis Date Noted  . S/P drug eluting coronary stent placement   . ACS (acute coronary syndrome) ([place]) 02/08/2018  . ST elevation myocardial infarction (STEMI) ([place]) 02/08/2018  . DKA (diabetic ketoacidosis) ([place]) 12/31/2017  . Marijuana abuse 12/31/2017  . Gastroparesis 12/06/2017  . Gastroparesis due to DM ([place]) 03/28/2017  . Coronary artery disease 03/28/2017  . Ischemic cardiomyopathy 03/28/2017  . Pure hypercholesterolemia   . NSTEMI (non-ST elevated myocardial infarction) ([place])   . Anemia 03/17/2017  . Allergic state 08/07/2015  . Vitiligo 07/09/2012  . Type 1 diabetes mellitus ([place]) 07/09/2012  . Depression 12/04/2010  .  Hypothyroidism 12/04/2010    Past Surgical History:  Procedure Laterality Date  . APPENDECTOMY    . CORONARY STENT INTERVENTION N/A 03/20/2017   Procedure: DES x 3 LAD, DES OM; Surgeon: Burnell Blanks, MD;  Location: [facility];  Service: Cardiovascular;  Laterality: N/A;  . CORONARY/GRAFT ACUTE MI REVASCULARIZATION N/A 02/08/2018   Procedure: Coronary/Graft Acute MI Revascularization;  Surgeon: Belva Crome, MD;  Location: [facility];  Service: Cardiovascular;  Laterality: N/A;  . LEFT HEART CATH AND CORONARY ANGIOGRAPHY N/A 03/20/2017   Procedure: LEFT HEART CATH AND CORONARY ANGIOGRAPHY;  Surgeon: Burnell Blanks, MD;  Location: [facility];  Service: Cardiovascular;  Laterality: N/A;  . LEFT HEART CATH AND CORONARY ANGIOGRAPHY N/A 02/08/2018   Procedure: LEFT HEART CATH AND CORONARY ANGIOGRAPHY;  Surgeon: Belva Crome, MD;  Location: [facility];  Service: Cardiovascular;  Laterality: N/A;     OB History    Gravida  0   Para  0   Term  0   Preterm  0   AB  0   Living  0     SAB  0   TAB  0   Ectopic  0   Multiple  0   Live Births  0            Home Medications    Prior  to Admission medications   Medication Sig Start Date End Date Taking? Authorizing Provider  aspirin 81 MG EC tablet Take 1 tablet (81 mg total) by mouth daily. 12/07/17  Yes Amin, Jeanella Flattery, MD  clopidogrel (PLAVIX) 75 MG tablet Take 1 tablet (75 mg total) by mouth daily with breakfast. 02/12/18  Yes Kathyrn Drown D, NP  FLUoxetine (PROZAC) 10 MG capsule Take 1 capsule (10 mg total) by mouth daily. 12/07/17 02/14/18 Yes Amin, Jeanella Flattery, MD  FLUoxetine (PROZAC) 40 MG capsule Take 1 capsule (40 mg total) by mouth daily. 12/07/17 02/14/18 Yes Amin, Ankit Chirag, MD  insulin aspart (NOVOLOG) 100 UNIT/ML injection 35 units per insulin pump daily Patient taking differently: Inject 35 Units into the skin daily. insulin pump daily 10/31/17  Yes  Renato Shin, MD  isosorbide mononitrate (IMDUR) 30 MG 24 hr tablet Take 0.5 tablets (15 mg total) by mouth daily. 02/11/18  Yes Kathyrn Drown D, NP  metoprolol tartrate (LOPRESSOR) 25 MG tablet Take 1 tablet (25 mg total) by mouth 2 (two) times daily. Hold for SBP<90 and HR<50 02/11/18  Yes Kathyrn Drown D, NP  montelukast (SINGULAIR) 10 MG tablet Take 1 tablet (10 mg total) by mouth at bedtime. 12/07/17 02/14/18 Yes Amin, Jeanella Flattery, MD  nitroGLYCERIN (NITROSTAT) 0.4 MG SL tablet Place 1 tablet (0.4 mg total) under the tongue every 5 (five) minutes as needed for chest pain. 12/07/17  Yes Amin, Ankit Chirag, MD  ondansetron (ZOFRAN) 4 MG tablet Take 1 tablet (4 mg total) by mouth every 8 (eight) hours as needed for nausea or vomiting. 01/02/18  Yes Ghimire, Henreitta Leber, MD  pantoprazole (PROTONIX) 40 MG tablet Take 1 tablet (40 mg total) by mouth daily. 12/07/17  Yes Amin, Jeanella Flattery, MD  rosuvastatin (CRESTOR) 40 MG tablet Take 1 tablet (40 mg total) by mouth daily at 6 PM. 02/11/18  Yes Kathyrn Drown D, NP  albuterol (PROVENTIL HFA;VENTOLIN HFA) 108 (90 Base) MCG/ACT inhaler Inhale 2 puffs into the lungs every 6 (six) hours as needed for wheezing or shortness of breath. 12/07/17 02/08/18  Amin, Jeanella Flattery, MD  HYDROcodone-acetaminophen (NORCO) 5-325 MG tablet Take 1 tablet by mouth every 4 (four) hours as needed (for pain). Patient not taking: Reported on 02/14/2018 12/07/17   Damita Lack, MD  levothyroxine (SYNTHROID) 100 MCG tablet Take 1 tablet (100 mcg total) by mouth daily before breakfast. 12/07/17 02/08/18  Amin, Jeanella Flattery, MD  metoCLOPramide (REGLAN) 10 MG tablet Take 1 tablet (10 mg total) by mouth every 8 (eight) hours as needed for nausea. 02/14/18   Davonna Belling, MD    Family History Family History  Problem Relation Age of Onset  . Coronary artery disease Father   . Congestive Heart Failure Father   . Asthma Father   . Cancer Mother        T cell lymphoma     Social History Social History   Tobacco Use  . Smoking status: Never Smoker  . Smokeless tobacco: Never Used  . Tobacco comment: Use marijuana  Substance Use Topics  . Alcohol use: No    Alcohol/week: 0.0 standard drinks  . Drug use: Yes    Types: Marijuana    Comment: last use last week, occaisonal marijuana use     Allergies   Ciprofloxin hcl [ciprofloxacin] and Food color red [red dye]   Review of Systems Review of Systems  Constitutional: Positive for appetite change. Negative for chills.  HENT: Negative for dental problem.   Cardiovascular:  Positive for chest pain.  Gastrointestinal: Positive for abdominal pain, nausea and vomiting.  Genitourinary: Negative for flank pain.  Musculoskeletal: Negative for back pain.  Skin: Negative for rash.  Psychiatric/Behavioral: Negative for confusion.     Physical Exam Updated Vital Signs BP (!) 169/92   Pulse (!) 118   Temp 98.6 F (37 C) (Oral)   Resp 13   Ht 5\' 5"  (1.651 m)   Wt 69.3 kg   LMP 01/24/2018   SpO2 99%   BMI 25.43 kg/m   Physical Exam HENT:     Head: Normocephalic.  Eyes:     Pupils: Pupils are equal, round, and reactive to light.  Neck:     Musculoskeletal: Neck supple.  Cardiovascular:     Rate and Rhythm: Normal rate.  Pulmonary:     Breath sounds: No rhonchi or rales.  Chest:     Chest wall: No tenderness.  Abdominal:     Comments: Mild upper abdominal tenderness without rebound or guarding.  Musculoskeletal:     Right lower leg: No edema.     Left lower leg: No edema.  Skin:    General: Skin is warm.     Capillary Refill: Capillary refill takes less than 2 seconds.  Neurological:     General: No focal deficit present.     Mental Status: She is alert.      ED Treatments / Results  Labs (all labs ordered are listed, but only abnormal results are displayed) Labs Reviewed  COMPREHENSIVE METABOLIC PANEL - Abnormal; Notable for the following components:      Result Value    CO2 21 (*)    Glucose, Bld 311 (*)    Total Bilirubin 1.5 (*)    Anion gap 16 (*)    All other components within normal limits  TROPONIN I - Abnormal; Notable for the following components:   Troponin I 0.09 (*)    All other components within normal limits  CBC WITH DIFFERENTIAL/PLATELET - Abnormal; Notable for the following components:   WBC 11.5 (*)    RBC 3.71 (*)    Hemoglobin 11.1 (*)    HCT 34.5 (*)    Platelets 452 (*)    Neutro Abs 9.8 (*)    All other components within normal limits  URINALYSIS, ROUTINE W REFLEX MICROSCOPIC - Abnormal; Notable for the following components:   Color, Urine COLORLESS (*)    Glucose, UA >=500 (*)    Ketones, ur 20 (*)    All other components within normal limits  TROPONIN I - Abnormal; Notable for the following components:   Troponin I 0.10 (*)    All other components within normal limits  LIPASE, BLOOD    EKG EKG Interpretation  Date/Time:  Thursday February 14 2018 15:40:34 EST Ventricular Rate:  95 PR Interval:    QRS Duration: 76 QT Interval:  415 QTC Calculation: 522 R Axis:   84 Text Interpretation:  Sinus rhythm Biatrial enlargement Anteroseptal infarct, age indeterminate Prolonged QT interval Baseline wander in lead(s) V3 Confirmed by Davonna Belling [phone]) on 02/14/2018 3:44:54 PM   Radiology Dg Chest Portable 1 View  Result Date: 02/14/2018 CLINICAL DATA:  Diffuse abdominal pain and vomiting since 11 a.m. today. Previous coronary artery stents. History of marijuana abuse. EXAM: PORTABLE CHEST 1 VIEW COMPARISON:  02/08/2018. FINDINGS: Normal sized heart. Coronary artery stents. Clear lungs. The lungs appear mildly hyperexpanded. Calcifications/ossification in the region of the distal rotator cuffs bilaterally. Minimal bilateral glenohumeral joint degenerative changes.  IMPRESSION: No acute abnormality. Mild hyperexpansion of the lungs, suggesting early changes of COPD. Electronically Signed   By: Claudie Revering M.D.   On:  02/14/2018 16:06    Procedures Procedures (including critical care time)  Medications Ordered in ED Medications  metoCLOPramide (REGLAN) injection 10 mg (10 mg Intravenous Given 02/14/18 1639)  alum & mag hydroxide-simeth (MAALOX/MYLANTA) 200-200-20 MG/5ML suspension 30 mL (30 mLs Oral Given 02/14/18 1658)    And  lidocaine (XYLOCAINE) 2 % viscous mouth solution 15 mL (15 mLs Oral Given 02/14/18 1658)  promethazine (PHENERGAN) injection 12.5 mg (12.5 mg Intravenous Given 02/14/18 1831)  sodium chloride 0.9 % bolus 1,000 mL (0 mLs Intravenous Stopped 02/14/18 2100)  sodium chloride 0.9 % bolus 1,000 mL (0 mLs Intravenous Stopped 02/14/18 2204)     Initial Impression / Assessment and Plan / ED Course  I have reviewed the triage vital signs and the nursing notes.  Pertinent labs & imaging results that were available during my care of the patient were reviewed by me and considered in my medical decision making (see chart for details).     Patient presents with nausea vomiting diarrhea abdominal pain.  Does have history of gastroparesis.  Has had tachycardia.  EKG stable.  Troponin mildly elevated but had recent MI and I think this is likely still tending down.  It is been stable on recheck here.  However does have a mild anion gap of 16 and does have some ketones in the urine.  Feel patient benefit from more fluid hydration and admission to hospital.  Has had improvement in her vomiting and the pain.  Final Clinical Impressions(s) / ED Diagnoses   Final diagnoses:  Non-intractable vomiting with nausea, unspecified vomiting type  Epigastric pain  Hyperglycemia    ED Discharge Orders         Ordered    metoCLOPramide (REGLAN) 10 MG tablet  Every 8 hours PRN     02/14/18 2304           Davonna Belling, MD 02/14/18 2308

## 2018-02-14 NOTE — ED Notes (Signed)
Pt given Sprite for PO challenge at bedside.

## 2018-02-14 NOTE — Telephone Encounter (Signed)
Left CVM per DPR.  Outreach x 3 for TOC call.  Advised Pt of upcoming appt on February 21, 2018 with B. [facility].  Asked Pt to arrive 15 minutes early and bring all of her medications with her.  Advised to call office if any questions.  This nurse name and # left for call back.

## 2018-02-15 DIAGNOSIS — E039 Hypothyroidism, unspecified: Secondary | ICD-10-CM

## 2018-02-15 DIAGNOSIS — I2510 Atherosclerotic heart disease of native coronary artery without angina pectoris: Secondary | ICD-10-CM

## 2018-02-15 DIAGNOSIS — Z955 Presence of coronary angioplasty implant and graft: Secondary | ICD-10-CM

## 2018-02-15 DIAGNOSIS — R1013 Epigastric pain: Secondary | ICD-10-CM

## 2018-02-15 DIAGNOSIS — E1065 Type 1 diabetes mellitus with hyperglycemia: Secondary | ICD-10-CM

## 2018-02-15 DIAGNOSIS — R112 Nausea with vomiting, unspecified: Secondary | ICD-10-CM

## 2018-02-15 LAB — CBC
HCT: 32.3 % — ABNORMAL LOW (ref 36.0–46.0)
Hemoglobin: 10.7 g/dL — ABNORMAL LOW (ref 12.0–15.0)
MCH: 30.3 pg (ref 26.0–34.0)
MCHC: 33.1 g/dL (ref 30.0–36.0)
MCV: 91.5 fL (ref 80.0–100.0)
Platelets: 424 10*3/uL — ABNORMAL HIGH (ref 150–400)
RBC: 3.53 MIL/uL — ABNORMAL LOW (ref 3.87–5.11)
RDW: 14.5 % (ref 11.5–15.5)
WBC: 10.8 10*3/uL — AB (ref 4.0–10.5)
nRBC: 0 % (ref 0.0–0.2)

## 2018-02-15 LAB — BASIC METABOLIC PANEL
Anion gap: 10 (ref 5–15)
BUN: 9 mg/dL (ref 6–20)
CO2: 21 mmol/L — ABNORMAL LOW (ref 22–32)
Calcium: 8.4 mg/dL — ABNORMAL LOW (ref 8.9–10.3)
Chloride: 103 mmol/L (ref 98–111)
Creatinine, Ser: 1.05 mg/dL — ABNORMAL HIGH (ref 0.44–1.00)
GFR calc Af Amer: >60 mL/min (ref >60)
Glucose, Bld: 305 mg/dL — ABNORMAL HIGH (ref 70–99)
Potassium: 4.3 mmol/L (ref 3.5–5.1)
SODIUM: 134 mmol/L — AB (ref 135–145)

## 2018-02-15 LAB — GLUCOSE, CAPILLARY
Glucose-Capillary: 302 mg/dL — ABNORMAL HIGH (ref 70–99)
Glucose-Capillary: 215 mg/dL — ABNORMAL HIGH (ref 70–99)
Glucose-Capillary: 244 mg/dL — ABNORMAL HIGH (ref 70–99)
Glucose-Capillary: 264 mg/dL — ABNORMAL HIGH (ref 70–99)
Glucose-Capillary: 196 mg/dL — ABNORMAL HIGH (ref 70–99)
Glucose-Capillary: 241 mg/dL — ABNORMAL HIGH (ref 70–99)

## 2018-02-15 LAB — KETONES, URINE: Ketones, ur: 20 mg/dL — AB

## 2018-02-15 LAB — TROPONIN I: Troponin I: 0.1 ng/mL (ref <0.03)

## 2018-02-15 LAB — RAPID URINE DRUG SCREEN, HOSP PERFORMED
AMPHETAMINES: NOT DETECTED
Barbiturates: NOT DETECTED
Benzodiazepines: NOT DETECTED
Cocaine: NOT DETECTED
Opiates: NOT DETECTED
Tetrahydrocannabinol: POSITIVE — AB

## 2018-02-15 MED ORDER — INSULIN PUMP
SUBCUTANEOUS | Status: DC
  Administered 2018-02-15 (×6): via SUBCUTANEOUS
  Administered 2018-02-16: 0.6 via SUBCUTANEOUS
  Administered 2018-02-16 (×2): via SUBCUTANEOUS
  Filled 2018-02-15: qty 1

## 2018-02-15 MED ORDER — CLOPIDOGREL BISULFATE 75 MG PO TABS
75.0000 mg | ORAL_TABLET | Freq: Every day | ORAL | Status: DC
  Administered 2018-02-15 – 2018-02-16 (×2): 75 mg via ORAL
  Filled 2018-02-15 (×2): qty 1

## 2018-02-15 MED ORDER — ROSUVASTATIN CALCIUM 20 MG PO TABS
40.0000 mg | ORAL_TABLET | Freq: Every day | ORAL | Status: DC
  Administered 2018-02-15: 40 mg via ORAL
  Filled 2018-02-15: qty 2

## 2018-02-15 MED ORDER — SODIUM CHLORIDE 0.9 % IV BOLUS
500.0000 mL | Freq: Once | INTRAVENOUS | Status: AC
  Administered 2018-02-15: 500 mL via INTRAVENOUS

## 2018-02-15 MED ORDER — ACETAMINOPHEN 325 MG PO TABS: 650.0000 mg | ORAL_TABLET | Freq: Four times a day (QID) | ORAL | Status: DC | PRN

## 2018-02-15 MED ORDER — ACETAMINOPHEN 650 MG RE SUPP: 650.0000 mg | Freq: Four times a day (QID) | RECTAL | Status: DC | PRN

## 2018-02-15 MED ORDER — FLUOXETINE HCL 40 MG PO CAPS: 40.0000 mg | ORAL_CAPSULE | Freq: Every day | ORAL | Status: DC

## 2018-02-15 MED ORDER — FLUOXETINE HCL 10 MG PO CAPS: 10.0000 mg | ORAL_CAPSULE | Freq: Every day | ORAL | Status: DC

## 2018-02-15 MED ORDER — INSULIN ASPART 100 UNIT/ML ~~LOC~~ SOLN: 35.0000 [IU] | Freq: Every day | SUBCUTANEOUS | Status: DC

## 2018-02-15 MED ORDER — FLUOXETINE HCL 20 MG PO CAPS
50.0000 mg | ORAL_CAPSULE | Freq: Every day | ORAL | Status: DC
  Administered 2018-02-15 – 2018-02-16 (×2): 50 mg via ORAL
  Filled 2018-02-15 (×2): qty 1

## 2018-02-15 MED ORDER — ISOSORBIDE MONONITRATE ER 30 MG PO TB24
15.0000 mg | ORAL_TABLET | Freq: Every day | ORAL | Status: DC
  Administered 2018-02-15 – 2018-02-16 (×2): 15 mg via ORAL
  Filled 2018-02-15 (×2): qty 1

## 2018-02-15 MED ORDER — ALBUTEROL SULFATE (2.5 MG/3ML) 0.083% IN NEBU: 3.0000 mL | INHALATION_SOLUTION | Freq: Four times a day (QID) | RESPIRATORY_TRACT | Status: DC | PRN

## 2018-02-15 MED ORDER — LEVOTHYROXINE SODIUM 100 MCG PO TABS
100.0000 ug | ORAL_TABLET | Freq: Every day | ORAL | Status: DC
  Administered 2018-02-15 – 2018-02-16 (×2): 100 ug via ORAL
  Filled 2018-02-15 (×2): qty 1

## 2018-02-15 MED ORDER — INSULIN ASPART 100 UNIT/ML ~~LOC~~ SOLN: 100.0000 [IU] | Freq: Once | SUBCUTANEOUS | Status: DC

## 2018-02-15 MED ORDER — ASPIRIN EC 81 MG PO TBEC
81.0000 mg | DELAYED_RELEASE_TABLET | Freq: Every day | ORAL | Status: DC
  Administered 2018-02-15 – 2018-02-16 (×2): 81 mg via ORAL
  Filled 2018-02-15 (×2): qty 1

## 2018-02-15 MED ORDER — PROMETHAZINE HCL 25 MG/ML IJ SOLN
12.5000 mg | Freq: Once | INTRAMUSCULAR | Status: AC
  Administered 2018-02-15: 12.5 mg via INTRAVENOUS
  Filled 2018-02-15: qty 1

## 2018-02-15 MED ORDER — SODIUM CHLORIDE 0.9 % IV SOLN
INTRAVENOUS | Status: AC
  Administered 2018-02-15 (×2): via INTRAVENOUS

## 2018-02-15 MED ORDER — MONTELUKAST SODIUM 10 MG PO TABS
10.0000 mg | ORAL_TABLET | Freq: Every day | ORAL | Status: DC
  Administered 2018-02-15: 10 mg via ORAL
  Filled 2018-02-15: qty 1

## 2018-02-15 MED ORDER — PANTOPRAZOLE SODIUM 40 MG PO TBEC
40.0000 mg | DELAYED_RELEASE_TABLET | Freq: Every day | ORAL | Status: DC
  Administered 2018-02-15 – 2018-02-16 (×2): 40 mg via ORAL
  Filled 2018-02-15 (×2): qty 1

## 2018-02-15 MED ORDER — METOCLOPRAMIDE HCL 5 MG/ML IJ SOLN
10.0000 mg | Freq: Three times a day (TID) | INTRAMUSCULAR | Status: DC
  Administered 2018-02-15 – 2018-02-16 (×5): 10 mg via INTRAVENOUS
  Filled 2018-02-15 (×5): qty 2

## 2018-02-15 MED ORDER — SODIUM CHLORIDE 0.9% FLUSH
3.0000 mL | Freq: Two times a day (BID) | INTRAVENOUS | Status: DC
  Administered 2018-02-15 – 2018-02-16 (×4): 3 mL via INTRAVENOUS

## 2018-02-15 MED ORDER — METOPROLOL TARTRATE 25 MG PO TABS
25.0000 mg | ORAL_TABLET | Freq: Two times a day (BID) | ORAL | Status: DC
  Administered 2018-02-15 – 2018-02-16 (×4): 25 mg via ORAL
  Filled 2018-02-15 (×4): qty 1

## 2018-02-15 MED ORDER — ENOXAPARIN SODIUM 40 MG/0.4ML ~~LOC~~ SOLN
40.0000 mg | SUBCUTANEOUS | Status: DC
  Administered 2018-02-15: 40 mg via SUBCUTANEOUS
  Filled 2018-02-15: qty 0.4

## 2018-02-15 NOTE — Consult Note (Addendum)
Cardiology Consultation:   Patient ID: [name] MRN: [id number]; DOB: [birth year]  Admit date: 02/14/2018 Date of Consult: 02/15/2018  Primary Care Provider: [name], NP Primary Cardiologist: Fransico Him, MD  Primary Electrophysiologist:  None    Patient Profile:   Jennifer Hogan is a 48 y.o. female with a hx of CAD with severe three-vessel disease s/p PTCA DES x3 in the LAD(ostial DES placed, mid DES placed, distal DES placed) and PTCA/DES x1 to intermediate branch 02/2017, followed by recent STEMI 02/08/18 with LCx stent placement + IDDM, readmitted for acute abdominal pain, n/v c/w gastroparesis, who is being seen today for the evaluation of elevated troponin at the request of Dr. Darrick Meigs, Internal Medicine.   History of Present Illness:   Jennifer Hogan a 48 y.o.femalewith history ofcoronary artery disease status postNSTEMI and PCI to large ramus and LAD (02/2017) and DM-1 on insulin pump who was recently admitted for STEMI 02/08/18-02/12/08. Initial ECG showed ST elevation in aVR, diffuse hyperacute T waves across the precordium and ST elevation in aVL. Given acute chest pain and diffuse ischemic ECG changes the cath lab was activated. She was taken to the cath lab on 02/08/2018 which revealed patent left main, proximal and distal LAD stent patent with mid vessel 75% stenosis between the 2 stents. There was a large first diagonal with new ostial 70% stenosis, mid diagonal stent which contained distal in-stent restenosis up to 50% and beyond the stented segment there is segmental 80% stenosis. There was a relatively small circumflex containing a 95% ostial to proximal stenosis followed by 50% eccentric stenosis in the mid vessel. The right coronary is large and widely patent. There were multiple lesions which may have been the culprit for her ongoing symptoms difficult. Per cath note, she underwent PTCA/DES to proximal/ostial circumflex was placed with 2.5 x 15 Onyx.  Troponin only peaked at 0.78. Echo showed normal LVEF at 50-55% and G2DD. No significant valvular abnormalities. She was discharged home on DAPT w/ ASA + Plavix, metoprolol, Crestor and Imdur.   She presented back to the ED yesterday w/ CC of acute abdomina pain, n/v, felt most c/w gastroparesis, which she has a h/o. She was admitted by IM and started on Reglan, clear liquid diet and IVFs. Given her recent STEMI, troponins were cycled and returned positive at 0.09>>0.10. She denies CP.  EKG on admit showed SR. EKG shows slight lateral TW abnormalties.  Past Medical History:  Diagnosis Date  . Allergic state 08/07/2015  . Anxiety   . Coronary artery disease    03/17/17 - DES x 3 LAD, DES OM; 02/08/18 - balloon angioplasty x 4 and stent placement  . Depression   . Diabetes mellitus    type 1, on insulin pump  . DKA (diabetic ketoacidoses) ([place]) 12/31/2017  . High cholesterol   . Hypothyroidism   . Vomiting and diarrhea 12/31/2017    Past Surgical History:  Procedure Laterality Date  . APPENDECTOMY    . CORONARY STENT INTERVENTION N/A 03/20/2017   Procedure: DES x 3 LAD, DES OM; Surgeon: Burnell Blanks, MD;  Location: [place] [facility];  Service: Cardiovascular;  Laterality: N/A;  . CORONARY/GRAFT ACUTE MI REVASCULARIZATION N/A 02/08/2018   Procedure: Coronary/Graft Acute MI Revascularization;  Surgeon: Belva Crome, MD;  Location: [place];  Service: Cardiovascular;  Laterality: N/A;  . LEFT HEART CATH AND CORONARY ANGIOGRAPHY N/A 03/20/2017   Procedure: LEFT HEART CATH AND CORONARY ANGIOGRAPHY;  Surgeon: Burnell Blanks,  MD;  Location: [place];  Service: Cardiovascular;  Laterality: N/A;  . LEFT HEART CATH AND CORONARY ANGIOGRAPHY N/A 02/08/2018   Procedure: LEFT HEART CATH AND CORONARY ANGIOGRAPHY;  Surgeon: Belva Crome, MD;  Location: [facility];  Service: Cardiovascular;  Laterality: N/A;     Home Medications:  Prior to Admission  medications   Medication Sig Start Date End Date Taking? Authorizing Provider  aspirin 81 MG EC tablet Take 1 tablet (81 mg total) by mouth daily. 12/07/17  Yes Amin, Jeanella Flattery, MD  clopidogrel (PLAVIX) 75 MG tablet Take 1 tablet (75 mg total) by mouth daily with breakfast. 02/12/18  Yes Kathyrn Drown D, NP  FLUoxetine (PROZAC) 10 MG capsule Take 1 capsule (10 mg total) by mouth daily. 12/07/17 02/14/18 Yes Amin, Jeanella Flattery, MD  FLUoxetine (PROZAC) 40 MG capsule Take 1 capsule (40 mg total) by mouth daily. 12/07/17 02/14/18 Yes Amin, Ankit Chirag, MD  insulin aspart (NOVOLOG) 100 UNIT/ML injection 35 units per insulin pump daily Patient taking differently: Inject 35 Units into the skin daily. insulin pump daily 10/31/17  Yes Renato Shin, MD  isosorbide mononitrate (IMDUR) 30 MG 24 hr tablet Take 0.5 tablets (15 mg total) by mouth daily. 02/11/18  Yes Kathyrn Drown D, NP  metoprolol tartrate (LOPRESSOR) 25 MG tablet Take 1 tablet (25 mg total) by mouth 2 (two) times daily. Hold for SBP<90 and HR<50 02/11/18  Yes Kathyrn Drown D, NP  montelukast (SINGULAIR) 10 MG tablet Take 1 tablet (10 mg total) by mouth at bedtime. 12/07/17 02/14/18 Yes Amin, Jeanella Flattery, MD  nitroGLYCERIN (NITROSTAT) 0.4 MG SL tablet Place 1 tablet (0.4 mg total) under the tongue every 5 (five) minutes as needed for chest pain. 12/07/17  Yes Amin, Ankit Chirag, MD  ondansetron (ZOFRAN) 4 MG tablet Take 1 tablet (4 mg total) by mouth every 8 (eight) hours as needed for nausea or vomiting. 01/02/18  Yes Ghimire, Henreitta Leber, MD  pantoprazole (PROTONIX) 40 MG tablet Take 1 tablet (40 mg total) by mouth daily. 12/07/17  Yes Amin, Jeanella Flattery, MD  rosuvastatin (CRESTOR) 40 MG tablet Take 1 tablet (40 mg total) by mouth daily at 6 PM. 02/11/18  Yes Kathyrn Drown D, NP  albuterol (PROVENTIL HFA;VENTOLIN HFA) 108 (90 Base) MCG/ACT inhaler Inhale 2 puffs into the lungs every 6 (six) hours as needed for wheezing or shortness of breath.  12/07/17 02/08/18  Amin, Jeanella Flattery, MD  HYDROcodone-acetaminophen (NORCO) 5-325 MG tablet Take 1 tablet by mouth every 4 (four) hours as needed (for pain). Patient not taking: Reported on 02/14/2018 12/07/17   Damita Lack, MD  levothyroxine (SYNTHROID) 100 MCG tablet Take 1 tablet (100 mcg total) by mouth daily before breakfast. 12/07/17 02/08/18  Amin, Jeanella Flattery, MD  metoCLOPramide (REGLAN) 10 MG tablet Take 1 tablet (10 mg total) by mouth every 8 (eight) hours as needed for nausea. 02/14/18   Davonna Belling, MD    Inpatient Medications: Scheduled Meds: . aspirin EC  81 mg Oral Daily  . clopidogrel  75 mg Oral Q breakfast  . enoxaparin (LOVENOX) injection  40 mg Subcutaneous Q24H  . FLUoxetine  50 mg Oral Daily  . insulin aspart  100 Units Subcutaneous Once  . insulin pump   Subcutaneous Q4H  . isosorbide mononitrate  15 mg Oral Daily  . levothyroxine  100 mcg Oral Q0600  . metoCLOPramide (REGLAN) injection  10 mg Intravenous Q8H  . metoprolol tartrate  25 mg Oral BID  .  montelukast  10 mg Oral QHS  . pantoprazole  40 mg Oral Daily  . rosuvastatin  40 mg Oral q1800  . sodium chloride flush  3 mL Intravenous Q12H   Continuous Infusions:  PRN Meds: acetaminophen **OR** acetaminophen, albuterol  Allergies:    Allergies  Allergen Reactions  . Ciprofloxin Hcl [Ciprofloxacin] Nausea And Vomiting and Other (See Comments)    Severe migraine  . Food Color Red [Red Dye] Diarrhea    Social History:   Social History   Socioeconomic History  . Marital status: Divorced    Spouse name: Not on file  . Number of children: Not on file  . Years of education: Not on file  . Highest education level: Not on file  Occupational History  . 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 Financial resource strain: Somewhat hard  . Food insecurity:    Worry: Never true    Inability: Never true  . Transportation needs:    Medical: No    Non-medical: No  Tobacco Use    . Smoking status: Never Smoker  . Smokeless tobacco: Never Used  . Tobacco comment: Use marijuana  Substance and Sexual Activity  . Alcohol use: No    Alcohol/week: 0.0 standard drinks  . Drug use: Yes    Types: Marijuana    Comment: last use last week, occaisonal marijuana use  . Sexual activity: Not Currently    Birth control/protection: None  Lifestyle  . Physical activity:    Days per week: Not on file    Minutes per session: Not on file  . Stress: Not on file  Relationships  . Social connections:    Talks on phone: Patient refused    Gets together: Patient refused    Attends religious service: Patient refused    Active member of club or organization: Patient refused    Attends meetings of clubs or organizations: Patient refused    Relationship status: Patient refused  . Intimate partner violence:    Fear of current or ex partner: No    Emotionally abused: No    Physically abused: No    Forced sexual activity: No  Other Topics Concern  . Not on file  Social History Narrative  . Not on file    Family History:   Family History  Problem Relation Age of Onset  . Coronary artery disease Father   . Congestive Heart Failure Father   . Asthma Father   . Cancer Mother        T cell lymphoma     ROS:  Please see the history of present illness.   All other ROS reviewed and negative.     Physical Exam/Data:   Vitals:   02/15/18 0158 02/15/18 0336 02/15/18 0633 02/15/18 1125  BP: (!) 78/50 (!) 84/54 (!) 94/51 (!) 154/78  Pulse:  84 88 (!) 125  Resp:  (!) 22 16 (!) 24  Temp:  98.7 F (37.1 C)  99.4 F (37.4 C)  TempSrc:  Oral  Oral  SpO2:  98% 98% 100%  Weight:      Height:        Intake/Output Summary (Last 24 hours) at 02/15/2018 1634 Last data filed at 02/15/2018 0604 Gross per 24 hour  Intake 4628.85 ml  Output -  Net 4628.85 ml   Last 3 Weights 02/15/2018 02/14/2018 02/13/2018  Weight (lbs) 143 lb 12.8 oz 152 lb 12.8 oz 152 lb 12.8 oz  Weight (kg)  65.227 kg 69.31 kg 69.31 kg     Body mass index is 23.93 kg/m.  General:  Young WF, in no acute distress but a bit anxious HEENT: normal Lymph: no adenopathy Neck: no JVD Endocrine:  No thryomegaly Vascular: No carotid bruits; FA pulses 2+ bilaterally without bruits  Cardiac:  normal S1, S2; RRR; no murmur  Lungs:  clear to auscultation bilaterally, no wheezing, rhonchi or rales  Abd: soft, nontender, no hepatomegaly  Ext: no edema Musculoskeletal:  No deformities, BUE and BLE strength normal and equal Skin: warm and dry  Neuro:  CNs 2-12 intact, no focal abnormalities noted Psych:  Normal affect   EKG:  The EKG was personally reviewed and demonstrates:  EKG shows slight lateral TW abnormalties Telemetry:  Telemetry was personally reviewed and demonstrates:  NSR  Relevant CV Studies: Cardiac cath 02/08/18  Previously placed Dist LM to Ost LAD drug eluting stent is widely patent.  Balloon angioplasty was performed.  Dist LAD lesion is 99% stenosed.  Balloon angioplasty was performed.  Previously placed Prox LAD drug eluting stent is widely patent.  Balloon angioplasty was performed.  Previously placed Mid LAD-1 drug eluting stent is widely patent.  Balloon angioplasty was performed.  Balloon angioplasty was performed.  Prox Cx lesion is 95% stenosed.  Mid Cx lesion is 60% stenosed.  1st Mrg lesion is 70% stenosed.  Prox LAD to Mid LAD lesion is 70% stenosed.  Mid LAD-2 lesion is 40% stenosed.  Ost 1st Mrg to 1st Mrg lesion is 35% stenosed.  Ost 1st Mrg lesion is 70% stenosed.  A stent was successfully placed.  Post intervention, there is a 0% residual stenosis.  Diagnostic  Dominance: Right   Intervention      Acute coronary syndrome with difficult to identify culprit in the setting of multifocal disease involving the LAD, diagonal, and circumflex coronary arteries.  System delay occurred in achieving revascularization due to atypical symptoms  on presentation and time spent deciding upon treatment strategy after anatomy was defined.  Successful ostial to proximal circumflex stent from 95% to 0% with TIMI grade III flow using a 2.25 x 15 Onyx postdilated to 2.75 mm in diameter.. The mid circumflex contains eccentric focal 70% stenosis.  Widely patent left main  LAD is large before branching into a very large first diagonal and the continuation of the LAD. A proximal and mid stent are noted in the LAD followed by an extremely long stent in the mid to distal LAD. In the mid LAD which was not stented there is 75% stenosis. The diagonal contains new concentric 70% ostial narrowing and a mid vessel stent with distal stent margin ISR up to 50%. Beyond the stent there is segmental 80% stenosis.  Mild anteroapical hypokinesis. EF 50%. LVEDP was less than 10.  RECOMMENDATIONS:   Patient should probably have a surgical consultation to see if her targets would be adequate for surgical revascularization if symptoms continue.  If symptoms resolve, medical therapy for now and aggressive risk factor modification.  Discussed with team member, Dr. Quay Burow, who will be caring for the patient later today.   TTE: 02/08/2018  - Left ventricle: The cavity size was normal. Wall thickness was normal. Basal inferior akinesis. Basal septal hypokinesis. Systolic function was low normal to mildly reduced. The estimated ejection fraction was in the range of 50% to 55%. Features are consistent with a pseudonormal left ventricular filling pattern, with concomitant abnormal relaxation and increased filling pressure (grade 2 diastolic dysfunction). -  Aortic valve: There was no stenosis. - Mitral valve: There was trivial regurgitation. - Right ventricle: The cavity size was normal. Systolic function was normal. - Pulmonary arteries: No complete TR doppler jet so unable to estimate PA systolic pressure. - Inferior vena  cava: The vessel was normal in size. The respirophasic diameter changes were in the normal range (>= 50%), consistent with normal central venous pressure.  Impressions:  - Normal LV size with EF 50-55%. Wall motion abnormalities as noted above. Normal RV size and systolic function. No significant valvular abnormalities.   Laboratory Data:  Chemistry Recent Labs  Lab 02/11/18 0930 02/14/18 1554 02/15/18 0229  NA 139 136 134*  K 4.5 3.7 4.3  CL 106 99 103  CO2 23 21* 21*  GLUCOSE 285* 311* 305*  BUN 7 9 9   CREATININE 0.79 0.87 1.05*  CALCIUM 8.4* 9.1 8.4*  GFRNONAA >60 >60 >60  GFRAA >60 >60 >60  ANIONGAP 10 16* 10    Recent Labs  Lab 02/11/18 0930 02/14/18 1554  PROT 5.9* 7.2  ALBUMIN 2.9* 3.9  AST 25 37  ALT 17 29  ALKPHOS 70 90  BILITOT 0.9 1.5*   Hematology Recent Labs  Lab 02/10/18 0257 02/14/18 1554 02/15/18 0229  WBC 10.0 11.5* 10.8*  RBC 3.53* 3.71* 3.53*  HGB 10.3* 11.1* 10.7*  HCT 32.3* 34.5* 32.3*  MCV 91.5 93.0 91.5  MCH 29.2 29.9 30.3  MCHC 31.9 32.2 33.1  RDW 14.2 14.5 14.5  PLT 332 452* 424*   Cardiac Enzymes Recent Labs  Lab 02/08/18 2308 02/09/18 0221 02/14/18 1554 02/14/18 1955 02/15/18 0229  TROPONINI 0.59* 0.47* 0.09* 0.10* 0.10*   No results for input(s): TROPIPOC in the last 168 hours.  BNPNo results for input(s): BNP, PROBNP in the last 168 hours.  DDimer No results for input(s): DDIMER in the last 168 hours.  Radiology/Studies:  Dg Chest Portable 1 View  Result Date: 02/14/2018 CLINICAL DATA:  Diffuse abdominal pain and vomiting since 11 a.m. today. Previous coronary artery stents. History of marijuana abuse. EXAM: PORTABLE CHEST 1 VIEW COMPARISON:  02/08/2018. FINDINGS: Normal sized heart. Coronary artery stents. Clear lungs. The lungs appear mildly hyperexpanded. Calcifications/ossification in the region of the distal rotator cuffs bilaterally. Minimal bilateral glenohumeral joint degenerative changes.  IMPRESSION: No acute abnormality. Mild hyperexpansion of the lungs, suggesting early changes of COPD. Electronically Signed   By: Claudie Revering M.D.   On: 02/14/2018 16:06    Assessment and Plan:   KIANNI LHEUREUX is a 48 y.o. female with a hx of CAD with severe three-vessel disease s/p PTCA DES x3 in the LAD(ostial DES placed, mid DES placed, distal DES placed) and PTCA/DES x1 to intermediate branch 02/2017, followed by recent STEMI 02/08/18 with LCx stent placement + IDDM, readmitted for acute abdominal pain, n/v c/w gastroparesis, who is being seen today for the evaluation of elevated troponin at the request of Dr. Darrick Meigs, Internal Medicine.   1. CAD s/p Recent STEMI/ Elevated Troponin: as noted above, prior interventions in 2019. Recent admission 02/08/18 for acute STEMI. Per cath note, ACS with difficult identification of culprit lesion secondary to multifocal disease involving the LAD, diagonal and LCx coronary arteries with successful ostial to proximal circumflex stent placement 01/17/20120. Peak troponin at 0.78 that admission. Readmitted now for gastroparesis. Repeat troponin 0.09>>0.10>>0.10. EKG shows slight lateral TW abnormalties. No active CP. Given low troponin and flat trend, low suspicion for acute in-stent thrombosis. No indication to take back to the cath lab at  this time. Recommend continuation of current cardiac medications + continued observation. Continue ASA, Plavix, metoprolol, Imdur and Crestor.   For questions or updates, please contact [place] Please consult [email or web address] for contact info under     Signed, Lyda Jester, PA-C  02/15/2018 4:34 PM  Patient seen and examined and history reviewed. Agree with above findings and plan. Patient just discharged earlier this week following a STEMI. Had emergent stenting of LCx. Troponin pead 0.78. did well and DC Monday. Since DC was doing well walking around [place] with her dog. States she has been eating mostly broth  since DC because she is still trying to figure out what she can eat with her gastroparesis. Last night and this morning she ate broccoli and cheese soup which was quite heavy for her and then developed acute epigastric pain, N/V. Symptoms have since resolved.  On exam she is in NAD No JVD or bruits.  Lungs are clear.  CV RRR without gallop or murmur Abdomen with some tenderness right epigastrium  Ecg shows new T wave inversion in leads Avl and V2-3. I have personally reviewed and interpreted this study. Troponin flat trend 0.09.  Impression: I agree her symptoms are likely due to gastroparesis. No evidence of recurrent ACS. Symptoms resolved. Would continue prior cardiac medication. Patient would like to see a gastroenterologist as outpatient. We will sign off. Call for other questions.   [facility] will sign off.   Medication Recommendations:  As per [facility] Other recommendations (labs, testing, etc):  none Follow up as an outpatient:  Keep cardiology follow up as before.    Peter Martinique, [place] 02/15/2018 5:10 PM

## 2018-02-15 NOTE — Progress Notes (Signed)
Received 1 vial of novolog insulin from pharmacy which will be used to refill patients insulin pump.

## 2018-02-15 NOTE — ED Notes (Signed)
Confirmed with pt that she does have her insulin pump with her.

## 2018-02-15 NOTE — Progress Notes (Signed)
[facility]  PROGRESS NOTE  Jennifer Hogan OEU:[id number] DOB: March 15, 1970 DOA: 02/14/2018 PCP: [name], NP   Brief HPI:   48 year old female with history of type 1 diabetes mellitus on insulin pump, CAD status post DES to large ramus and LAD and recent STEMI with PTCA/DES to proximal/ostial circumflex 02/08/2018, hypothyroidism, hyperlipidemia, depression/anxiety came to ED with acute onset of abdominal pain, nausea and vomiting.    Subjective   *Patient continues to have nausea and abdominal pain.   Assessment/Plan:     1. Nausea and vomiting-secondary to gastroparesis, continue Reglan 10 mg IV q. 8 hours, trial of  clear liquid diet.  If patient tolerates clear liquid diet, will advance her diet.  Continue IV normal saline.  2. CAD s/p DES to large ramus and LAD-recent STEMI with PTCA/DES to proximal/ostial circumflex on 02/08/2018.  Admitting physician discussed with on-call cardiologist Dr. Hartwick Lions regarding elevated troponin 0.09 and 0.10.  Recommended to continue with aspirin and Plavix, Lopressor and Imdur, Crestor.  Troponin has stabilized at 0.10.  No chest pain.  Will consult cardiology for further recommendations.  3. Type 1 diabetes mellitus with hyperglycemia-patient on insulin pump, A1c 7.6.  4. Hypothyroidism-continue Synthroid  5. Prolonged QTC-continue monitoring on telemetry.  Serum magnesium is 2.0.  6. Hyperlipidemia-continue Crestor     CBG: Recent Labs  Lab 02/14/18 2316 02/15/18 0143 02/15/18 0355 02/15/18 0813 02/15/18 1122  GLUCAP 224* 302* 215* 244* 264*    CBC: Recent Labs  Lab 02/09/18 0221 02/10/18 0257 02/14/18 1554 02/15/18 0229  WBC 22.7* 10.0 11.5* 10.8*  NEUTROABS  --   --  9.8*  --   HGB 10.9* 10.3* 11.1* 10.7*  HCT 34.0* 32.3* 34.5* 32.3*  MCV 92.9 91.5 93.0 91.5  PLT 442* 332 452* 424*    Basic Metabolic Panel: Recent Labs  Lab 02/09/18 0221 02/09/18 0646 02/09/18 0953 02/09/18 1410  02/10/18 0257 02/11/18 0930 02/14/18 1554 02/15/18 0229  NA 136  --  135 136 136 139 136 134*  K 4.0  --  4.1 3.5 3.3* 4.5 3.7 4.3  CL 109  --  111 108 107 106 99 103  CO2 11*  --  16* 19* 20* 23 21* 21*  GLUCOSE 194*  --  159* 161* 166* 285* 311* 305*  BUN 11  --  8 5* <5* 7 9 9   CREATININE 0.79  --  0.73 0.60 0.62 0.79 0.87 1.05*  CALCIUM 7.9*  --  7.7* 7.9* 7.9* 8.4* 9.1 8.4*  MG 2.2 2.2 2.0 2.0 2.0  --   --   --      DVT prophylaxis: Lovenox  Code Status: Full code  Family Communication: No family at bedside  Disposition Plan: likely home when medically ready for discharge     Consultants:    Procedures:     Antibiotics:   Anti-infectives (From admission, onward)   None       Objective   Vitals:   02/15/18 0158 02/15/18 0336 02/15/18 0633 02/15/18 1125  BP: (!) 78/50 (!) 84/54 (!) 94/51 (!) 154/78  Pulse:  84 88 (!) 125  Resp:  (!) 22 16 (!) 24  Temp:  98.7 F (37.1 C)  99.4 F (37.4 C)  TempSrc:  Oral  Oral  SpO2:  98% 98% 100%  Weight:      Height:        Intake/Output Summary (Last 24 hours) at 02/15/2018 1548 Last data filed at 02/15/2018 0604 Gross per 24 hour  Intake 4628.85 ml  Output -  Net 4628.85 ml   Filed Weights   02/14/18 1543 02/15/18 0100  Weight: 69.3 kg 65.2 kg     Physical Examination:    General: Appears in mild distress  Cardiovascular: S1-S2, regular, no murmur auscultated.  Respiratory: Clear to auscultation bilaterally  Abdomen: Soft, mild tenderness in epigastric region, no organomegaly.  No rigidity or guarding.  Extremities: No edema of the lower extremities  Neurologic: Alert, oriented x3, no focal deficit noted.     Data Reviewed: I have personally reviewed following labs and imaging studies   Recent Results (from the past 240 hour(s))  MRSA PCR Screening     Status: None   Collection Time: 02/08/18 10:08 AM  Result Value Ref Range Status   MRSA by PCR NEGATIVE NEGATIVE Final     Comment:        The GeneXpert MRSA Assay (FDA approved for NASAL specimens only), is one component of a comprehensive MRSA colonization surveillance program. It is not intended to diagnose MRSA infection nor to guide or monitor treatment for MRSA infections. Performed at [facility], [place] [address] [place], [place] [address]      Liver Function Tests: Recent Labs  Lab 02/11/18 0930 02/14/18 1554  AST 25 37  ALT 17 29  ALKPHOS 70 90  BILITOT 0.9 1.5*  PROT 5.9* 7.2  ALBUMIN 2.9* 3.9   Recent Labs  Lab 02/14/18 1554  LIPASE 21   No results for input(s): AMMONIA in the last 168 hours.  Cardiac Enzymes: Recent Labs  Lab 02/08/18 2308 02/09/18 0221 02/14/18 1554 02/14/18 1955 02/15/18 0229  TROPONINI 0.59* 0.47* 0.09* 0.10* 0.10*   BNP (last 3 results) No results for input(s): BNP in the last 8760 hours.  ProBNP (last 3 results) No results for input(s): PROBNP in the last 8760 hours.    Studies: Dg Chest Portable 1 View  Result Date: 02/14/2018 CLINICAL DATA:  Diffuse abdominal pain and vomiting since 11 a.m. today. Previous coronary artery stents. History of marijuana abuse. EXAM: PORTABLE CHEST 1 VIEW COMPARISON:  02/08/2018. FINDINGS: Normal sized heart. Coronary artery stents. Clear lungs. The lungs appear mildly hyperexpanded. Calcifications/ossification in the region of the distal rotator cuffs bilaterally. Minimal bilateral glenohumeral joint degenerative changes. IMPRESSION: No acute abnormality. Mild hyperexpansion of the lungs, suggesting early changes of COPD. Electronically Signed   By: Claudie Revering M.D.   On: 02/14/2018 16:06    Scheduled Meds: . aspirin EC  81 mg Oral Daily  . clopidogrel  75 mg Oral Q breakfast  . enoxaparin (LOVENOX) injection  40 mg Subcutaneous Q24H  . FLUoxetine  50 mg Oral Daily  . insulin aspart  100 Units Subcutaneous Once  . insulin pump   Subcutaneous Q4H  . isosorbide mononitrate  15 mg Oral Daily  .  levothyroxine  100 mcg Oral Q0600  . metoCLOPramide (REGLAN) injection  10 mg Intravenous Q8H  . metoprolol tartrate  25 mg Oral BID  . montelukast  10 mg Oral QHS  . pantoprazole  40 mg Oral Daily  . rosuvastatin  40 mg Oral q1800  . sodium chloride flush  3 mL Intravenous Q12H    Admission status: Observation: Based on patients clinical presentation and evaluation of above clinical data, I have made determination that patient will need less than 2 midnight stay in the hospital.  Still continues to have nausea and vomiting.  Time spent: 20 minutes  [facility] Pager [phone]. If 7PM-7AM, please contact night-coverage at [email or web address], Office  [phone]  password [email or web address]  02/15/2018, 3:48 PM  LOS: 0 days

## 2018-02-15 NOTE — Progress Notes (Signed)
Received report from ED RN about patient coming to [facility]. Awaiting arrival. Lajoyce Corners, RN

## 2018-02-16 LAB — CBC
HCT: 31.6 % — ABNORMAL LOW (ref 36.0–46.0)
Hemoglobin: 9.9 g/dL — ABNORMAL LOW (ref 12.0–15.0)
MCH: 29.3 pg (ref 26.0–34.0)
MCHC: 31.3 g/dL (ref 30.0–36.0)
MCV: 93.5 fL (ref 80.0–100.0)
Platelets: 407 10*3/uL — ABNORMAL HIGH (ref 150–400)
RBC: 3.38 MIL/uL — ABNORMAL LOW (ref 3.87–5.11)
RDW: 15.1 % (ref 11.5–15.5)
WBC: 10.2 10*3/uL (ref 4.0–10.5)
nRBC: 0 % (ref 0.0–0.2)

## 2018-02-16 LAB — COMPREHENSIVE METABOLIC PANEL
ALT: 23 U/L (ref 0–44)
AST: 26 U/L (ref 15–41)
Albumin: 3.2 g/dL — ABNORMAL LOW (ref 3.5–5.0)
Alkaline Phosphatase: 69 U/L (ref 38–126)
Anion gap: 7 (ref 5–15)
BUN: 11 mg/dL (ref 6–20)
CHLORIDE: 107 mmol/L (ref 98–111)
CO2: 25 mmol/L (ref 22–32)
Calcium: 8.5 mg/dL — ABNORMAL LOW (ref 8.9–10.3)
Creatinine, Ser: 0.92 mg/dL (ref 0.44–1.00)
GFR calc Af Amer: >60 mL/min (ref >60)
GFR calc non Af Amer: >60 mL/min (ref >60)
Glucose, Bld: 145 mg/dL — ABNORMAL HIGH (ref 70–99)
Potassium: 3.8 mmol/L (ref 3.5–5.1)
Sodium: 139 mmol/L (ref 135–145)
Total Bilirubin: 0.8 mg/dL (ref 0.3–1.2)
Total Protein: 6.2 g/dL — ABNORMAL LOW (ref 6.5–8.1)

## 2018-02-16 LAB — GLUCOSE, CAPILLARY
GLUCOSE-CAPILLARY: 194 mg/dL — AB (ref 70–99)
Glucose-Capillary: 165 mg/dL — ABNORMAL HIGH (ref 70–99)
Glucose-Capillary: 217 mg/dL — ABNORMAL HIGH (ref 70–99)
Glucose-Capillary: 230 mg/dL — ABNORMAL HIGH (ref 70–99)

## 2018-02-16 MED ORDER — METOCLOPRAMIDE HCL 10 MG PO TABS: 10.0000 mg | ORAL_TABLET | Freq: Three times a day (TID) | ORAL | 0 refills | Status: DC | PRN

## 2018-02-16 NOTE — Progress Notes (Signed)
Patient arrived to [facility]. Patient given CHG and assessment completed. Pt was just discharged last week, here for a cardiac cath with stenting after a NSTEMI. Pt has insulin pump in LUQ of abdomen. Telemetry applied. Pt feeling nauseous. Asked MD for something for nausea. Will continue to monitor. Lajoyce Corners, RN

## 2018-02-16 NOTE — Progress Notes (Signed)
Discharge instructions reviewed with patient and she denies questions. Requested Rx be sent to [facility] on [facility] and Dr Darrick Meigs contacted and agreed. All belongings with patient and she is discharged in stable condition, without N/V after having soft breakfast and lunch to family vehicle via wheelchair.

## 2018-02-16 NOTE — Discharge Summary (Signed)
Physician Discharge Summary  Jennifer Hogan WUJ:[id number] DOB: 1970/09/30 DOA: 02/14/2018  PCP: [name], NP  Admit date: 02/14/2018 Discharge date: 02/16/2018  Time spent: 25 minutes  Recommendations for Outpatient Follow-up:  1. Follow up PCP in 2 weeks  Discharge Diagnoses:  Principal Problem:   Non-intractable vomiting with nausea Active Problems:   Depression   Hypothyroidism   Coronary artery disease   S/P drug eluting coronary stent placement   Hyperglycemia due to type 1 diabetes mellitus ([place])   Hyperlipidemia   Epigastric pain   Discharge Condition: Stable  Diet recommendation: heart healthy diet  Filed Weights   02/14/18 1543 02/15/18 0100  Weight: 69.3 kg 65.2 kg    History of present illness:  48 year old female with history of type 1 diabetes mellitus on insulin pump, CAD status post DES to large ramus and LAD and recent STEMI with PTCA/DES to proximal/ostial circumflex 02/08/2018, hypothyroidism, hyperlipidemia, depression/anxiety came to ED with acute onset of abdominal pain, nausea and vomiting.   Hospital Course:   1. Nausea and vomiting-secondary to gastroparesis, patient was started on  Reglan 10 mg IV q. 8 hours, trial of  clear liquid diet.  Diet has been advanced to soft diet.  Patient nausea vomiting has resolved.  She will be discharged on p.o. Reglan 10 mg every 8 hours as needed.  2. CAD s/p DES to large ramus and LAD-recent STEMI with PTCA/DES to proximal/ostial circumflex on 02/08/2018.  Admitting physician discussed with on-call cardiologist Dr. Duck Lions regarding elevated troponin 0.09 and 0.10.  Recommended to continue with aspirin and Plavix, Lopressor and Imdur, Crestor.  Troponin has stabilized at 0.10.  No chest pain.  Cardiology was consulted, did not feel that patient has ACS.  Likely from demand ischemia from gastroparesis.  No further intervention recommended.  3. Type 1 diabetes mellitus with hyperglycemia-patient on  insulin pump, A1c 7.6.  4. Hypothyroidism-continue Synthroid  5. Prolonged QTC-patient was monitored on telemetry.  No arrhythmias noted.  Serum magnesium is 2.0.  6. Hyperlipidemia-continue Crestor   Procedures:    Consultations:  Cardiology  Discharge Exam: Vitals:   02/15/18 2148 02/16/18 0413  BP: 103/64 100/64  Pulse: 84 80  Resp:  20  Temp:  98.2 F (36.8 C)  SpO2:  100%    General: Appears in no acute distress Cardiovascular: S1-S2, regular, no murmur auscultated Respiratory: Clear to auscultation bilaterally. Abdomen-soft, nontender, no organomegaly.  Discharge Instructions   Discharge Instructions    Diet - low sodium heart healthy   Complete by:  As directed    Increase activity slowly   Complete by:  As directed      Allergies as of 02/16/2018      Reactions   Ciprofloxin Hcl [ciprofloxacin] Nausea And Vomiting, Other (See Comments)   Severe migraine   Food Color Red [red Dye] Diarrhea      Medication List    TAKE these medications   albuterol 108 (90 Base) MCG/ACT inhaler Commonly known as:  PROVENTIL HFA;VENTOLIN HFA Inhale 2 puffs into the lungs every 6 (six) hours as needed for wheezing or shortness of breath.   aspirin 81 MG EC tablet Take 1 tablet (81 mg total) by mouth daily.   clopidogrel 75 MG tablet Commonly known as:  PLAVIX Take 1 tablet (75 mg total) by mouth daily with breakfast.   FLUoxetine 10 MG capsule Commonly known as:  PROZAC Take 1 capsule (10 mg total) by mouth daily.   FLUoxetine 40 MG capsule Commonly known  as:  PROZAC Take 1 capsule (40 mg total) by mouth daily.   HYDROcodone-acetaminophen 5-325 MG tablet Commonly known as:  NORCO Take 1 tablet by mouth every 4 (four) hours as needed (for pain).   insulin aspart 100 UNIT/ML injection Commonly known as:  novoLOG 35 units per insulin pump daily What changed:    how much to take  how to take this  when to take this  additional instructions    isosorbide mononitrate 30 MG 24 hr tablet Commonly known as:  IMDUR Take 0.5 tablets (15 mg total) by mouth daily.   levothyroxine 100 MCG tablet Commonly known as:  SYNTHROID Take 1 tablet (100 mcg total) by mouth daily before breakfast.   metoCLOPramide 10 MG tablet Commonly known as:  REGLAN Take 1 tablet (10 mg total) by mouth every 8 (eight) hours as needed for nausea.   metoprolol tartrate 25 MG tablet Commonly known as:  LOPRESSOR Take 1 tablet (25 mg total) by mouth 2 (two) times daily. Hold for SBP<90 and HR<50   montelukast 10 MG tablet Commonly known as:  SINGULAIR Take 1 tablet (10 mg total) by mouth at bedtime.   nitroGLYCERIN 0.4 MG SL tablet Commonly known as:  NITROSTAT Place 1 tablet (0.4 mg total) under the tongue every 5 (five) minutes as needed for chest pain.   ondansetron 4 MG tablet Commonly known as:  ZOFRAN Take 1 tablet (4 mg total) by mouth every 8 (eight) hours as needed for nausea or vomiting.   pantoprazole 40 MG tablet Commonly known as:  PROTONIX Take 1 tablet (40 mg total) by mouth daily.   rosuvastatin 40 MG tablet Commonly known as:  CRESTOR Take 1 tablet (40 mg total) by mouth daily at 6 PM.      Allergies  Allergen Reactions  . Ciprofloxin Hcl [Ciprofloxacin] Nausea And Vomiting and Other (See Comments)    Severe migraine  . Food Color Red [Red Dye] Diarrhea   Follow-up Information    [name], NP.   Specialty:  Nurse Practitioner Why:  As needed Contact information: [place] [address] [phone]            The results of significant diagnostics from this hospitalization (including imaging, microbiology, ancillary and laboratory) are listed below for reference.    Significant Diagnostic Studies: Dg Chest Portable 1 View  Result Date: 02/14/2018 CLINICAL DATA:  Diffuse abdominal pain and vomiting since 11 a.m. today. Previous coronary artery stents. History of marijuana abuse. EXAM:  PORTABLE CHEST 1 VIEW COMPARISON:  02/08/2018. FINDINGS: Normal sized heart. Coronary artery stents. Clear lungs. The lungs appear mildly hyperexpanded. Calcifications/ossification in the region of the distal rotator cuffs bilaterally. Minimal bilateral glenohumeral joint degenerative changes. IMPRESSION: No acute abnormality. Mild hyperexpansion of the lungs, suggesting early changes of COPD. Electronically Signed   By: Claudie Revering M.D.   On: 02/14/2018 16:06   Dg Chest Port 1 View  Result Date: 02/08/2018 CLINICAL DATA:  Chest pain EXAM: PORTABLE CHEST 1 VIEW COMPARISON:  03/15/2017 FINDINGS: Normal heart size and mediastinal contours. Coronary stents. There is no edema, consolidation, effusion, or pneumothorax. Midthoracic degenerative disc narrowing. IMPRESSION: No evidence of active disease. Electronically Signed   By: Monte Fantasia M.D.   On: 02/08/2018 05:09    Microbiology: Recent Results (from the past 240 hour(s))  MRSA PCR Screening     Status: None   Collection Time: 02/08/18 10:08 AM  Result Value Ref Range Status   MRSA  by PCR NEGATIVE NEGATIVE Final    Comment:        The GeneXpert MRSA Assay (FDA approved for NASAL specimens only), is one component of a comprehensive MRSA colonization surveillance program. It is not intended to diagnose MRSA infection nor to guide or monitor treatment for MRSA infections. Performed at [facility], [place] [address], [place], [place] [address]      Labs: Basic Metabolic Panel: Recent Labs  Lab 02/09/18 1410 02/10/18 0257 02/11/18 0930 02/14/18 1554 02/15/18 0229 02/16/18 0257  NA 136 136 139 136 134* 139  K 3.5 3.3* 4.5 3.7 4.3 3.8  CL 108 107 106 99 103 107  CO2 19* 20* 23 21* 21* 25  GLUCOSE 161* 166* 285* 311* 305* 145*  BUN 5* <5* 7 9 9 11   CREATININE 0.60 0.62 0.79 0.87 1.05* 0.92  CALCIUM 7.9* 7.9* 8.4* 9.1 8.4* 8.5*  MG 2.0 2.0  --   --   --   --    Liver Function Tests: Recent Labs  Lab 02/11/18 0930  02/14/18 1554 02/16/18 0257  AST 25 37 26  ALT 17 29 23   ALKPHOS 70 90 69  BILITOT 0.9 1.5* 0.8  PROT 5.9* 7.2 6.2*  ALBUMIN 2.9* 3.9 3.2*   Recent Labs  Lab 02/14/18 1554  LIPASE 21   No results for input(s): AMMONIA in the last 168 hours. CBC: Recent Labs  Lab 02/10/18 0257 02/14/18 1554 02/15/18 0229 02/16/18 0257  WBC 10.0 11.5* 10.8* 10.2  NEUTROABS  --  9.8*  --   --   HGB 10.3* 11.1* 10.7* 9.9*  HCT 32.3* 34.5* 32.3* 31.6*  MCV 91.5 93.0 91.5 93.5  PLT 332 452* 424* 407*   Cardiac Enzymes: Recent Labs  Lab 02/14/18 1554 02/14/18 1955 02/15/18 0229  TROPONINI 0.09* 0.10* 0.10*    CBG: Recent Labs  Lab 02/15/18 2020 02/16/18 0006 02/16/18 0412 02/16/18 0808 02/16/18 1136  GLUCAP 241* 217* 165* 194* 230*       Signed:  Oswald Hillock MD.  [facility] 02/16/2018, 12:22 PM

## 2018-02-16 NOTE — Plan of Care (Signed)
  Problem: Education: Goal: Knowledge of General Education information will improve Description Including pain rating scale, medication(s)/side effects and non-pharmacologic comfort measures Outcome: Adequate for Discharge   Problem: Health Behavior/Discharge Planning: Goal: Ability to manage health-related needs will improve Outcome: Adequate for Discharge   Problem: Clinical Measurements: Goal: Ability to maintain clinical measurements within normal limits will improve Outcome: Adequate for Discharge Goal: Will remain free from infection Outcome: Adequate for Discharge Goal: Diagnostic test results will improve Outcome: Adequate for Discharge Goal: Respiratory complications will improve Outcome: Adequate for Discharge Goal: Cardiovascular complication will be avoided Outcome: Adequate for Discharge   Problem: Elimination: Goal: Will not experience complications related to bowel motility Outcome: Adequate for Discharge Goal: Will not experience complications related to urinary retention Outcome: Adequate for Discharge   Problem: Pain Managment: Goal: General experience of comfort will improve Outcome: Adequate for Discharge   Problem: Safety: Goal: Ability to remain free from injury will improve Outcome: Adequate for Discharge   Problem: Skin Integrity: Goal: Risk for impaired skin integrity will decrease Outcome: Adequate for Discharge   Problem: Activity: Goal: Risk for activity intolerance will decrease Outcome: Adequate for Discharge   Problem: Clinical Measurements: Goal: Respiratory complications will improve Outcome: Adequate for Discharge

## 2018-02-18 ENCOUNTER — Telehealth (HOSPITAL_COMMUNITY): Payer: Self-pay

## 2018-02-18 ENCOUNTER — Telehealth: Payer: Self-pay | Admitting: Cardiology

## 2018-02-18 ENCOUNTER — Encounter: Payer: Self-pay | Admitting: *Deleted

## 2018-02-18 ENCOUNTER — Ambulatory Visit (HOSPITAL_COMMUNITY)
Admission: RE | Admit: 2018-02-18 | Discharge: 2018-02-18 | Disposition: A | Discharge status: Home / Self Care | Payer: Self-pay | Source: Ambulatory Visit | Attending: Internal Medicine | Admitting: Internal Medicine

## 2018-02-18 VITALS — BP 104/80 | HR 81

## 2018-02-18 DIAGNOSIS — Z006 Encounter for examination for normal comparison and control in clinical research program: Secondary | ICD-10-CM

## 2018-02-18 MED ORDER — STUDY - AEGIS II STUDY - PLACEBO OR CSL112 (PI-HILTY)
170.0000 mL | INTRAVENOUS | Status: DC
  Administered 2018-02-18: 170 mL via INTRAVENOUS
  Filled 2018-02-18 (×2): qty 170

## 2018-02-18 MED FILL — METOCLOPRAMIDE 10 MG TABLET: 10 | 30 days supply | Qty: 30 | Fill #0

## 2018-02-18 NOTE — Telephone Encounter (Signed)
New Message     Patient states she was told to call if her temperature went over 100.4, and she now has a fever and her temperature is at 100.3.

## 2018-02-18 NOTE — Telephone Encounter (Signed)
Called and spoke with pt in regards to CR, pt stated she is waiting to hear back from [facility] and should hear something back within the next few weeks.   Will follow up with pt in 2 weeks, placed pt card in black bin on support rep desk.

## 2018-02-18 NOTE — Progress Notes (Addendum)
Was notified later today that the patient had a 100.62F today - this apparently was noted after her infusion appointment as vital signs reflect she was afebrile at the time of infusion. She was advised to monitor for changes in her temperature after infusion and to be checked by her PCP for infection or other causes of fever.  I discussed this with Foye Deer, RN, research nurse coordinator for [place] and Dr. Radford Pax, her primary cardiologist. She had a follow-up appointment with cardiology on 02/21/2018, but this has been cancelled.  Pixie Casino, MD, [facility], [place] Director of the Advanced Lipid Disorders &  Cardiovascular Risk Reduction Clinic Diplomate of the American Board of Clinical Lipidology Attending Cardiologist  Direct Dial: [phone]  Fax: [phone]  Website:  [email or web address]

## 2018-02-18 NOTE — Research (Signed)
[employer] study  Pt doing well since being discharged Saturday with gastroparesis. Just trying to figure out her diet habits. No chest pain or sob. No labs drawn today.                                   "CONSENT"   YES     NO   Continuing further Investigational Product and study visits for follow-up? [x]  []   Continuing consent from future biomedical research [x]  []                                     "EVENTS"    YES     NO  AE   (IF YES SEE SOURCE) []  [x]   SAE  (IF YES SEE SOURCE) [x]  []   ENDPOINT   (IF YES SEE SOURCE) []  [x]   REVASCULARIZATION  (IF YES SEE SOURCE) []  [x]   AMPUTATION   (IF YES SEE SOURCE) []  [x]   TROPONIN'S  (IF YES SEE SOURCE) []  [x]      Current Outpatient Medications:  .  albuterol (PROVENTIL HFA;VENTOLIN HFA) 108 (90 Base) MCG/ACT inhaler, Inhale 2 puffs into the lungs every 6 (six) hours as needed for wheezing or shortness of breath., Disp: 1 Inhaler, Rfl: 0 .  aspirin 81 MG EC tablet, Take 1 tablet (81 mg total) by mouth daily., Disp: 90 tablet, Rfl: 0 .  clopidogrel (PLAVIX) 75 MG tablet, Take 1 tablet (75 mg total) by mouth daily with breakfast., Disp: 30 tablet, Rfl: 11 .  FLUoxetine (PROZAC) 10 MG capsule, Take 1 capsule (10 mg total) by mouth daily., Disp: 30 capsule, Rfl: 0 .  FLUoxetine (PROZAC) 40 MG capsule, Take 1 capsule (40 mg total) by mouth daily., Disp: 30 capsule, Rfl: 0 .  HYDROcodone-acetaminophen (NORCO) 5-325 MG tablet, Take 1 tablet by mouth every 4 (four) hours as needed (for pain). (Patient not taking: Reported on 02/14/2018), Disp: 20 tablet, Rfl: 0 .  insulin aspart (NOVOLOG) 100 UNIT/ML injection, 35 units per insulin pump daily (Patient taking differently: Inject 35 Units into the skin daily. insulin pump daily), Disp: 10 mL, Rfl: 11 .  isosorbide mononitrate (IMDUR) 30 MG 24 hr tablet, Take 0.5 tablets (15 mg total) by mouth daily., Disp: 30 tablet, Rfl: 11 .  levothyroxine (SYNTHROID) 100 MCG tablet, Take 1 tablet (100 mcg total) by mouth  daily before breakfast., Disp: 30 tablet, Rfl: 0 .  metoCLOPramide (REGLAN) 10 MG tablet, Take 1 tablet (10 mg total) by mouth every 8 (eight) hours as needed for nausea., Disp: 30 tablet, Rfl: 0 .  metoprolol tartrate (LOPRESSOR) 25 MG tablet, Take 1 tablet (25 mg total) by mouth 2 (two) times daily. Hold for SBP<90 and HR<50, Disp: 60 tablet, Rfl: 11 .  montelukast (SINGULAIR) 10 MG tablet, Take 1 tablet (10 mg total) by mouth at bedtime., Disp: 30 tablet, Rfl: 0 .  nitroGLYCERIN (NITROSTAT) 0.4 MG SL tablet, Place 1 tablet (0.4 mg total) under the tongue every 5 (five) minutes as needed for chest pain., Disp: 30 tablet, Rfl: 0 .  ondansetron (ZOFRAN) 4 MG tablet, Take 1 tablet (4 mg total) by mouth every 8 (eight) hours as needed for nausea or vomiting., Disp: 20 tablet, Rfl: 0 .  pantoprazole (PROTONIX) 40 MG tablet, Take 1 tablet (40 mg total) by mouth daily., Disp: 30 tablet, Rfl: 0 .  rosuvastatin (  CRESTOR) 40 MG tablet, Take 1 tablet (40 mg total) by mouth daily at 6 PM., Disp: 30 tablet, Rfl: 11 No current facility-administered medications for this visit.   Facility-Administered Medications Ordered in Other Visits:  .  STUDY - AEGIS II - placebo or CSL112 (PI-Hilty), 170 mL, Intravenous, Q7 days, Hilty, Nadean Corwin, MD, Stopped at 02/18/18 1235

## 2018-02-18 NOTE — Telephone Encounter (Signed)
Spoke to patient who called because her temp is at 100.3 and was told to call post cath discharge instructions if temp is above 100.4.  She had her heart cath 1/17, but has been hospitalized since (see record) and had an IV infusion today 1/27.  I am not sure if this should be addressed by her PCP, please advise.  Thank you.

## 2018-02-19 NOTE — Telephone Encounter (Signed)
LM TO CALL BACK ./CY 

## 2018-02-19 NOTE — Telephone Encounter (Signed)
I would like her to see her PCP today

## 2018-02-19 NOTE — Telephone Encounter (Signed)
This message was forwarded by MD to research nurse.   Routed to primary cardiologist Dr. Radford Pax, per notes, to follow up on with recommendations  Patient has PA OV on 02/21/18

## 2018-02-20 ENCOUNTER — Encounter: Payer: Self-pay | Admitting: Cardiology

## 2018-02-20 NOTE — Telephone Encounter (Signed)
LMTCB... pt has post hosp appt 02/21/2018

## 2018-02-21 ENCOUNTER — Ambulatory Visit: Payer: No Typology Code available for payment source | Admitting: Cardiology

## 2018-02-25 ENCOUNTER — Ambulatory Visit (HOSPITAL_COMMUNITY)
Admission: RE | Admit: 2018-02-25 | Discharge: 2018-02-25 | Disposition: A | Discharge status: Home / Self Care | Payer: Self-pay | Source: Ambulatory Visit | Attending: Internal Medicine | Admitting: Internal Medicine

## 2018-02-25 ENCOUNTER — Encounter: Payer: Self-pay | Admitting: *Deleted

## 2018-02-25 VITALS — BP 116/73 | HR 75

## 2018-02-25 DIAGNOSIS — Z006 Encounter for examination for normal comparison and control in clinical research program: Secondary | ICD-10-CM

## 2018-02-25 MED ORDER — STUDY - AEGIS II STUDY - PLACEBO OR CSL112 (PI-HILTY)
170.0000 mL | Freq: Once | INTRAVENOUS | Status: AC
  Administered 2018-02-25: 170 mL via INTRAVENOUS
  Filled 2018-02-25: qty 170

## 2018-02-27 ENCOUNTER — Encounter (HOSPITAL_COMMUNITY): Payer: Self-pay | Admitting: Emergency Medicine

## 2018-02-27 ENCOUNTER — Ambulatory Visit (HOSPITAL_COMMUNITY)
Admission: EM | Admit: 2018-02-27 | Discharge: 2018-02-27 | Disposition: A | Discharge status: Home / Self Care | Payer: Self-pay | Attending: Emergency Medicine | Admitting: Emergency Medicine

## 2018-02-27 DIAGNOSIS — J0140 Acute pansinusitis, unspecified: Secondary | ICD-10-CM

## 2018-02-27 MED ORDER — FLUCONAZOLE 150 MG PO TABS: 150.0000 mg | ORAL_TABLET | Freq: Every day | ORAL | 0 refills | Status: DC

## 2018-02-27 MED ORDER — IPRATROPIUM BROMIDE 0.06 % NA SOLN: 2.0000 | Freq: Four times a day (QID) | NASAL | 0 refills | Status: DC

## 2018-02-27 MED ORDER — DOXYCYCLINE HYCLATE 100 MG PO CAPS: 100.0000 mg | ORAL_CAPSULE | Freq: Two times a day (BID) | ORAL | 0 refills | Status: DC

## 2018-02-27 MED FILL — IPRATROPIUM 0.06% SPRAY: 0.06 | 30 days supply | Qty: 15 | Fill #0

## 2018-02-27 MED FILL — FLUCONAZOLE 150 MG TABS: 150 | 2 days supply | Qty: 2 | Fill #0

## 2018-02-27 MED FILL — DOXYCYCLINE HYCLATE 100 MG: 100 | 10 days supply | Qty: 20 | Fill #0

## 2018-02-27 NOTE — Discharge Instructions (Addendum)
Start doxycycline as directed. Start atrovent nasal spray for nasal congestion/drainage. You can use over the counter nasal saline rinse such as neti pot for nasal congestion. Keep hydrated, your urine should be clear to pale yellow in color. Tylenol/motrin for fever and pain. Monitor for any worsening of symptoms, chest pain, shortness of breath, wheezing, swelling of the throat, follow up for reevaluation.   For sore throat/cough try using a honey-based tea. Use 3 teaspoons of honey with juice squeezed from half lemon. Place shaved pieces of ginger into 1/2-1 cup of water and warm over stove top. Then mix the ingredients and repeat every 4 hours as needed.

## 2018-02-27 NOTE — ED Provider Notes (Signed)
[facility]    CSN: [id number] Arrival date & time: 02/27/18  1154     History   Chief Complaint Chief Complaint  Patient presents with  . URI    HPI Jennifer Hogan is a 48 y.o. female.   48 year old female with history of CAD, DM1, HLD, comes in for 1.5 week history of URI symptoms. She has had sore throat, productive cough, rhinorrhea, nasal congestion, sinus pressure. Tmax 100.5. She has had increase in CBG in the 400's and has been trying to dose down with insulin. She has had some "queasiness", denies nausea/vomiting. Denies abdominal pain, diarrhea. Has been taking otc cold medicine without relief.      Past Medical History:  Diagnosis Date  . Allergic state 08/07/2015  . Anxiety   . Coronary artery disease    03/17/17 - DES x 3 LAD, DES OM; 02/08/18 - balloon angioplasty x 4 and stent placement  . Depression   . Diabetes mellitus    type 1, on insulin pump  . DKA (diabetic ketoacidoses) ([place]) 12/31/2017  . High cholesterol   . Hypothyroidism   . Vomiting and diarrhea 12/31/2017    Patient Active Problem List   Diagnosis Date Noted  . Epigastric pain   . Hyperglycemia due to type 1 diabetes mellitus ([place]) 02/14/2018  . Hyperlipidemia 02/14/2018  . S/P drug eluting coronary stent placement   . ACS (acute coronary syndrome) ([place]) 02/08/2018  . ST elevation myocardial infarction (STEMI) ([place]) 02/08/2018  . DKA (diabetic ketoacidosis) ([place]) 12/31/2017  . Marijuana abuse 12/31/2017  . Gastroparesis 12/06/2017  . Gastroparesis due to DM ([place]) 03/28/2017  . Coronary artery disease 03/28/2017  . Ischemic cardiomyopathy 03/28/2017  . Pure hypercholesterolemia   . NSTEMI (non-ST elevated myocardial infarction) ([place])   . Anemia 03/17/2017  . Allergic state 08/07/2015  . Vitiligo 07/09/2012  . Type 1 diabetes mellitus ([place]) 07/09/2012  . Depression 12/04/2010  . Non-intractable vomiting with nausea 12/04/2010  . Hypothyroidism 12/04/2010    Past  Surgical History:  Procedure Laterality Date  . APPENDECTOMY    . CORONARY STENT INTERVENTION N/A 03/20/2017   Procedure: DES x 3 LAD, DES OM; Surgeon: Burnell Blanks, MD;  Location: [facility];  Service: Cardiovascular;  Laterality: N/A;  . CORONARY/GRAFT ACUTE MI REVASCULARIZATION N/A 02/08/2018   Procedure: Coronary/Graft Acute MI Revascularization;  Surgeon: Belva Crome, MD;  Location: [facility];  Service: Cardiovascular;  Laterality: N/A;  . LEFT HEART CATH AND CORONARY ANGIOGRAPHY N/A 03/20/2017   Procedure: LEFT HEART CATH AND CORONARY ANGIOGRAPHY;  Surgeon: Burnell Blanks, MD;  Location: [facility];  Service: Cardiovascular;  Laterality: N/A;  . LEFT HEART CATH AND CORONARY ANGIOGRAPHY N/A 02/08/2018   Procedure: LEFT HEART CATH AND CORONARY ANGIOGRAPHY;  Surgeon: Belva Crome, MD;  Location: [facility];  Service: Cardiovascular;  Laterality: N/A;    OB History    Gravida  0   Para  0   Term  0   Preterm  0   AB  0   Living  0     SAB  0   TAB  0   Ectopic  0   Multiple  0   Live Births  0            Home Medications    Prior to Admission medications   Medication Sig Start Date End Date Taking? Authorizing Provider  albuterol (PROVENTIL HFA;VENTOLIN HFA) 108 (  90 Base) MCG/ACT inhaler Inhale 2 puffs into the lungs every 6 (six) hours as needed for wheezing or shortness of breath. 12/07/17 02/08/18  Damita Lack, MD  aspirin 81 MG EC tablet Take 1 tablet (81 mg total) by mouth daily. 12/07/17   Damita Lack, MD  clopidogrel (PLAVIX) 75 MG tablet Take 1 tablet (75 mg total) by mouth daily with breakfast. 02/12/18   Kathyrn Drown D, NP  doxycycline (VIBRAMYCIN) 100 MG capsule Take 1 capsule (100 mg total) by mouth 2 (two) times daily. 02/27/18   Tasia Catchings, Amy V, PA-C  fluconazole (DIFLUCAN) 150 MG tablet Take 1 tablet (150 mg total) by mouth daily. Take second dose 72 hours later if symptoms still persists.  02/27/18   Tasia Catchings, Amy V, PA-C  FLUoxetine (PROZAC) 10 MG capsule Take 1 capsule (10 mg total) by mouth daily. 12/07/17 02/18/18  Amin, Jeanella Flattery, MD  FLUoxetine (PROZAC) 40 MG capsule Take 1 capsule (40 mg total) by mouth daily. 12/07/17 02/18/18  Amin, Jeanella Flattery, MD  HYDROcodone-acetaminophen (NORCO) 5-325 MG tablet Take 1 tablet by mouth every 4 (four) hours as needed (for pain). 12/07/17   Amin, Jeanella Flattery, MD  insulin aspart (NOVOLOG) 100 UNIT/ML injection 35 units per insulin pump daily Patient taking differently: Inject 35 Units into the skin daily. insulin pump daily 10/31/17   Renato Shin, MD  ipratropium (ATROVENT) 0.06 % nasal spray Place 2 sprays into both nostrils 4 (four) times daily. 02/27/18   Tasia Catchings, Amy V, PA-C  isosorbide mononitrate (IMDUR) 30 MG 24 hr tablet Take 0.5 tablets (15 mg total) by mouth daily. 02/11/18   Kathyrn Drown D, NP  levothyroxine (SYNTHROID) 100 MCG tablet Take 1 tablet (100 mcg total) by mouth daily before breakfast. 12/07/17 02/18/18  Amin, Jeanella Flattery, MD  metoCLOPramide (REGLAN) 10 MG tablet Take 1 tablet (10 mg total) by mouth every 8 (eight) hours as needed for nausea. 02/16/18   Oswald Hillock, MD  metoprolol tartrate (LOPRESSOR) 25 MG tablet Take 1 tablet (25 mg total) by mouth 2 (two) times daily. Hold for SBP<90 and HR<50 02/11/18   Kathyrn Drown D, NP  montelukast (SINGULAIR) 10 MG tablet Take 1 tablet (10 mg total) by mouth at bedtime. 12/07/17 02/14/18  Amin, Jeanella Flattery, MD  nitroGLYCERIN (NITROSTAT) 0.4 MG SL tablet Place 1 tablet (0.4 mg total) under the tongue every 5 (five) minutes as needed for chest pain. 12/07/17   Amin, Jeanella Flattery, MD  ondansetron (ZOFRAN) 4 MG tablet Take 1 tablet (4 mg total) by mouth every 8 (eight) hours as needed for nausea or vomiting. 01/02/18   Ghimire, Henreitta Leber, MD  pantoprazole (PROTONIX) 40 MG tablet Take 1 tablet (40 mg total) by mouth daily. 12/07/17   Amin, Jeanella Flattery, MD  rosuvastatin (CRESTOR) 40 MG tablet  Take 1 tablet (40 mg total) by mouth daily at 6 PM. 02/11/18   Tommie Raymond, NP    Family History Family History  Problem Relation Age of Onset  . Coronary artery disease Father   . Congestive Heart Failure Father   . Asthma Father   . Cancer Mother        T cell lymphoma    Social History Social History   Tobacco Use  . Smoking status: Never Smoker  . Smokeless tobacco: Never Used  . Tobacco comment: Use marijuana  Substance Use Topics  . Alcohol use: No    Alcohol/week: 0.0 standard drinks  . Drug use: Yes  Types: Marijuana    Comment: last use last week, occaisonal marijuana use     Allergies   Ciprofloxin hcl [ciprofloxacin] and Food color red [red dye]   Review of Systems Review of Systems  Reason unable to perform ROS: See HPI as above.     Physical Exam Triage Vital Signs ED Triage Vitals [02/27/18 1203]  Enc Vitals Group     BP 123/78     Pulse Rate 79     Resp 18     Temp 98.9 F (37.2 C)     Temp Source Oral     SpO2 100 %     Weight      Height      Head Circumference      Peak Flow      Pain Score 0     Pain Loc      Pain Edu?      Excl. in [place]?    No data found.  Updated Vital Signs BP 123/78 (BP Location: Right Arm)   Pulse 79   Temp 98.9 F (37.2 C) (Oral)   Resp 18   SpO2 100%   Visual Acuity Right Eye Distance:   Left Eye Distance:   Bilateral Distance:    Right Eye Near:   Left Eye Near:    Bilateral Near:     Physical Exam Constitutional:      General: She is not in acute distress.    Appearance: Normal appearance. She is well-developed. She is not ill-appearing, toxic-appearing or diaphoretic.  HENT:     Head: Normocephalic and atraumatic.     Right Ear: Tympanic membrane, ear canal and external ear normal. Tympanic membrane is not erythematous or bulging.     Left Ear: Tympanic membrane, ear canal and external ear normal. Tympanic membrane is not erythematous or bulging.     Nose: No rhinorrhea.     Right  Sinus: Maxillary sinus tenderness and frontal sinus tenderness present.     Left Sinus: Maxillary sinus tenderness and frontal sinus tenderness present.     Mouth/Throat:     Pharynx: Uvula midline.  Eyes:     Conjunctiva/sclera: Conjunctivae normal.     Pupils: Pupils are equal, round, and reactive to light.  Neck:     Musculoskeletal: Normal range of motion and neck supple.  Cardiovascular:     Rate and Rhythm: Normal rate and regular rhythm.     Heart sounds: Normal heart sounds. No murmur. No friction rub. No gallop.   Pulmonary:     Effort: Pulmonary effort is normal. No respiratory distress.     Breath sounds: Normal breath sounds. No stridor. No decreased breath sounds, wheezing, rhonchi or rales.  Lymphadenopathy:     Cervical: No cervical adenopathy.  Skin:    General: Skin is warm and dry.  Neurological:     Mental Status: She is alert and oriented to person, place, and time.  Psychiatric:        Behavior: Behavior normal.        Judgment: Judgment normal.      UC Treatments / Results  Labs (all labs ordered are listed, but only abnormal results are displayed) Labs Reviewed - No data to display  EKG None  Radiology No results found.  Procedures Procedures (including critical care time)  Medications Ordered in UC Medications - No data to display  Initial Impression / Assessment and Plan / UC Course  I have reviewed the triage vital signs and the  nursing notes.  Pertinent labs & imaging results that were available during my care of the patient were reviewed by me and considered in my medical decision making (see chart for details).    Doxycycline for sinusitis. Other symptomatic treatment discussed. Return precautions given.   Patient requesting diflucan to cover for yeast. Has an allergy to red dye causing diarrhea. States she has had diflucan in the past without problems.  Final Clinical Impressions(s) / UC Diagnoses   Final diagnoses:  Acute  non-recurrent pansinusitis    ED Prescriptions    Medication Sig Dispense Auth. Provider   doxycycline (VIBRAMYCIN) 100 MG capsule Take 1 capsule (100 mg total) by mouth 2 (two) times daily. 20 capsule Yu, Amy V, PA-C   fluconazole (DIFLUCAN) 150 MG tablet Take 1 tablet (150 mg total) by mouth daily. Take second dose 72 hours later if symptoms still persists. 2 tablet Yu, Amy V, PA-C   ipratropium (ATROVENT) 0.06 % nasal spray Place 2 sprays into both nostrils 4 (four) times daily. 15 mL Tobin Chad, [place] 02/27/18 1233

## 2018-02-27 NOTE — ED Triage Notes (Signed)
Pt presents to [facility] for assessment of sore throat, cough, green phlegm, fevers, high sugars (DM), for 1.5 weeks and worsening the last few days.  Pt states she might have taken Tylenol today.

## 2018-02-27 NOTE — Research (Signed)
Visit 4 infusion 3  Pt doing well, no complaints of cp or sob. Fighting a cold, but no fevers since last week.                                    "CONSENT"   YES     NO   Continuing further Investigational Product and study visits for follow-up? [x]  []   Continuing consent from future biomedical research [x]  []                                    "EVENTS"    YES     NO  AE   (IF YES SEE SOURCE) [x]  []   SAE  (IF YES SEE SOURCE) []  [x]   ENDPOINT   (IF YES SEE SOURCE) []  [x]   REVASCULARIZATION  (IF YES SEE SOURCE) []  [x]   AMPUTATION   (IF YES SEE SOURCE) []  [x]   TROPONIN'S  (IF YES SEE SOURCE) []  [x]    No med changes at this time.

## 2018-02-28 ENCOUNTER — Inpatient Hospital Stay: Payer: Self-pay | Admitting: Family Medicine

## 2018-03-04 ENCOUNTER — Encounter: Payer: Self-pay | Admitting: Physician Assistant

## 2018-03-04 ENCOUNTER — Encounter: Payer: Self-pay | Admitting: *Deleted

## 2018-03-04 ENCOUNTER — Ambulatory Visit (HOSPITAL_COMMUNITY)
Admission: RE | Admit: 2018-03-04 | Discharge: 2018-03-04 | Disposition: A | Discharge status: Home / Self Care | Payer: Self-pay | Source: Ambulatory Visit | Attending: Internal Medicine | Admitting: Internal Medicine

## 2018-03-04 VITALS — BP 96/64 | HR 69

## 2018-03-04 DIAGNOSIS — Z006 Encounter for examination for normal comparison and control in clinical research program: Secondary | ICD-10-CM

## 2018-03-04 MED ORDER — STUDY - AEGIS II STUDY - PLACEBO OR CSL112 (PI-HILTY)
170.0000 mL | Freq: Once | INTRAVENOUS | Status: AC
  Administered 2018-03-04: 170 mL via INTRAVENOUS
  Filled 2018-03-04: qty 170

## 2018-03-04 NOTE — Progress Notes (Deleted)
Cardiology Office Note    Date:  03/04/2018  ID:  Jennifer Hogan, DOB February 18, 1970, MRN [id number] PCP:  [name], NP  Cardiologist:  Fransico Him, MD   Chief Complaint: f/u CAD  History of Present Illness:  Jennifer Hogan is a 48 y.o. female with history of CAD with severe three-vesseldisease s/pPTCA DES x3 in the LAD (ostial DES placed, mid DES placed, distal DES placed)and PTCA/DES x1to intermediate branch 02/2017, followed by recent STEMI 02/08/18 with LCx stent placement + IDDM, HLD, hypotyroidism, anxiety, anemia and thrombocytosis by labs who persents for post-hospital follow-up.  She has h/o outlinued above withcoronary artery disease status postNSTEMI and PCI to large ramus and LAD(02/2017). She was recently admitted for STEMI 02/08/18-02/12/08. She was taken to the cath lab on 02/08/2018 which revealedpatent left main, proximal and distal LAD stent patent with mid vessel 75% stenosis between the 2 stents.There was a largefirst diagonal with new ostial 70% stenosis, middiagonal stentwhichcontaineddistal in-stent restenosis up to 50% and beyond the stented segment there is segmental 80% stenosis. There was a relatively small circumflex containinga 95% ostial to proximal stenosis followed by 50% eccentric stenosis in the mid vessel. The right coronary is large and widely patent. There were multiple lesionswhich may have been theculprit for her ongoing symptoms difficult.Per cath note, she underwent PTCA/DES toproximal/ostial circumflexwas placedwith 2.5 x 15 Onyx. Troponin only peaked at 0.78. Echo showed LVEF 50-55%, basal inferior akinesis and basal septal hypokinesis, normal RV, no significant valve abnormalities. She was discharged home on DAPT w/ ASA + Plavix, metoprolol, Crestor and Imdur. (She has prior h/o SOB with Brilinta.) She presented back to the ED 1/23 with acute abdominal pain, n/v, felt most c/w gastroparesis. She was admitted by IM and started on  Reglan, clear liquid diet and IVFs. Given her recent STEMI, troponins were cycled and returned positive at 0.09>>0.10. Symptoms were not felt consistent with ACS and no cardiac changes were made. She is also enrolled into AEGIS II trial. Otherwise recent labs showed K 3.8, Cr 0.92, albumin 3.2, normal AST/ALT, Hgb 9.9, plt 407, UDS + THC, A1C 7.6, LDL 177.  Cath site Plan for liipds - LDL was super high  CAD Anemia, unspecified Hyperlipidemia Hypothyroidism    Past Medical History:  Diagnosis Date  . Allergic state 08/07/2015  . Anemia   . Anxiety   . Coronary artery disease    a. s/pPTCA DES x3 in the LAD (ostial DES placed, mid DES placed, distal DES placed)and PTCA/DES x1to intermediate branch 02/2017. b. STEMI 02/08/18 with LCx stent placement.  . Depression   . Diabetes mellitus    type 1, on insulin pump  . DKA (diabetic ketoacidoses) ([place]) 12/31/2017  . Elevated platelet count   . High cholesterol   . Hypothyroidism   . Vomiting and diarrhea 12/31/2017    Past Surgical History:  Procedure Laterality Date  . APPENDECTOMY    . CORONARY STENT INTERVENTION N/A 03/20/2017   Procedure: DES x 3 LAD, DES OM; Surgeon: Burnell Blanks, MD;  Location: [facility];  Service: Cardiovascular;  Laterality: N/A;  . CORONARY/GRAFT ACUTE MI REVASCULARIZATION N/A 02/08/2018   Procedure: Coronary/Graft Acute MI Revascularization;  Surgeon: Belva Crome, MD;  Location: [facility];  Service: Cardiovascular;  Laterality: N/A;  . LEFT HEART CATH AND CORONARY ANGIOGRAPHY N/A 03/20/2017   Procedure: LEFT HEART CATH AND CORONARY ANGIOGRAPHY;  Surgeon: Burnell Blanks, MD;  Location: [facility];  Service: Cardiovascular;  Laterality: N/A;  . LEFT HEART CATH AND CORONARY ANGIOGRAPHY N/A 02/08/2018   Procedure: LEFT HEART CATH AND CORONARY ANGIOGRAPHY;  Surgeon: Belva Crome, MD;  Location: [facility];  Service: Cardiovascular;  Laterality: N/A;     Current Medications: No outpatient medications have been marked as taking for the 03/05/18 encounter (Appointment) with Charlie Pitter, PA-C.   ***   Allergies:   Ciprofloxin hcl [ciprofloxacin] and Food color red [red dye]   Social History   Socioeconomic History  . Marital status: Divorced    Spouse name: Not on file  . Number of children: Not on file  . Years of education: Not on file  . Highest education level: Not on file  Occupational History  . 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 Financial resource strain: Somewhat hard  . Food insecurity:    Worry: Never true    Inability: Never true  . Transportation needs:    Medical: No    Non-medical: No  Tobacco Use  . Smoking status: Never Smoker  . Smokeless tobacco: Never Used  . Tobacco comment: Use marijuana  Substance and Sexual Activity  . Alcohol use: No    Alcohol/week: 0.0 standard drinks  . Drug use: Yes    Types: Marijuana    Comment: last use last week, occaisonal marijuana use  . Sexual activity: Not Currently    Birth control/protection: None  Lifestyle  . Physical activity:    Days per week: Not on file    Minutes per session: Not on file  . Stress: Not on file  Relationships  . Social connections:    Talks on phone: Patient refused    Gets together: Patient refused    Attends religious service: Patient refused    Active member of club or organization: Patient refused    Attends meetings of clubs or organizations: Patient refused    Relationship status: Patient refused  Other Topics Concern  . Not on file  Social History Narrative  . Not on file     Family History:  The patient's ***family history includes Asthma in her father; Cancer in her mother; Congestive Heart Failure in her father; Coronary artery disease in her father.  ROS:   Please see the history of present illness. Otherwise, review of systems is positive for ***.  All other systems are reviewed and  otherwise negative.    PHYSICAL EXAM:   VS:  There were no vitals taken for this visit.  BMI: There is no height or weight on file to calculate BMI. GEN: Well nourished, well developed, in no acute distress HEENT: normocephalic, atraumatic Neck: no JVD, carotid bruits, or masses Cardiac: ***RRR; no murmurs, rubs, or gallops, no edema  Respiratory:  clear to auscultation bilaterally, normal work of breathing GI: soft, nontender, nondistended, + BS MS: no deformity or atrophy Skin: warm and dry, no rash Neuro:  Alert and Oriented x 3, Strength and sensation are intact, follows commands Psych: euthymic mood, full affect  Wt Readings from Last 3 Encounters:  02/25/18 144 lb (65.3 kg)  02/15/18 143 lb 12.8 oz (65.2 kg)  02/13/18 152 lb 12.8 oz (69.3 kg)      Studies/Labs Reviewed:   EKG:  EKG was ordered today and personally reviewed by me and demonstrates *** EKG was not ordered today.***  Recent Labs: 02/08/2018: TSH 0.934 02/10/2018: Magnesium 2.0 02/16/2018: ALT 23; BUN 11; Creatinine, Ser 0.92; Hemoglobin 9.9; Platelets 407; Potassium 3.8;  Sodium 139   Lipid Panel    Component Value Date/Time   CHOL 290 (H) 02/08/2018 0517   CHOL 155 05/10/2017 0849   TRIG 107 02/08/2018 0517   HDL 92 02/08/2018 0517   HDL 73 05/10/2017 0849   CHOLHDL 3.2 02/08/2018 0517   VLDL 21 02/08/2018 0517   LDLCALC 177 (H) 02/08/2018 0517   LDLCALC 66 05/10/2017 0849    Additional studies/ records that were reviewed today include: Summarized above.***    ASSESSMENT & PLAN:   1. ***  Disposition: F/u with ***   Medication Adjustments/Labs and Tests Ordered: Current medicines are reviewed at length with the patient today.  Concerns regarding medicines are outlined above. Medication changes, Labs and Tests ordered today are summarized above and listed in the Patient Instructions accessible in Encounters.   Signed, Charlie Pitter, PA-C  03/04/2018 7:21 AM    [facility]  [facility] [address], [place], [place]  [address] Phone: [phone]; Fax: [phone]

## 2018-03-04 NOTE — Research (Signed)
Visit 5 infusion 4 Pt doing well, no complaints of cp or sob. Feeling some better since being seen at urgent care last week for an URI.Marland Kitchen Almost finished with antibiotics. AE documented due to going to urgent care for the URI.   Central lab only lab drawn today                                   "CONSENT"   YES     NO   Continuing further Investigational Product and study visits for follow-up? [x]  []   Continuing consent from future biomedical research [x]  []                                    "EVENTS"    YES     NO  AE   (IF YES SEE SOURCE) [x]  []   SAE  (IF YES SEE SOURCE) []  [x]   ENDPOINT   (IF YES SEE SOURCE) []  [x]   REVASCULARIZATION  (IF YES SEE SOURCE) []  [x]   AMPUTATION   (IF YES SEE SOURCE) []  [x]   TROPONIN'S  (IF YES SEE SOURCE) []  [x]     Current Outpatient Medications:  .  albuterol (PROVENTIL HFA;VENTOLIN HFA) 108 (90 Base) MCG/ACT inhaler, Inhale 2 puffs into the lungs every 6 (six) hours as needed for wheezing or shortness of breath., Disp: 1 Inhaler, Rfl: 0 .  aspirin 81 MG EC tablet, Take 1 tablet (81 mg total) by mouth daily., Disp: 90 tablet, Rfl: 0 .  clopidogrel (PLAVIX) 75 MG tablet, Take 1 tablet (75 mg total) by mouth daily with breakfast., Disp: 30 tablet, Rfl: 11 .  doxycycline (VIBRAMYCIN) 100 MG capsule, Take 1 capsule (100 mg total) by mouth 2 (two) times daily., Disp: 20 capsule, Rfl: 0 .  fluconazole (DIFLUCAN) 150 MG tablet, Take 1 tablet (150 mg total) by mouth daily. Take second dose 72 hours later if symptoms still persists., Disp: 2 tablet, Rfl: 0 .  FLUoxetine (PROZAC) 10 MG capsule, Take 1 capsule (10 mg total) by mouth daily., Disp: 30 capsule, Rfl: 0 .  FLUoxetine (PROZAC) 40 MG capsule, Take 1 capsule (40 mg total) by mouth daily., Disp: 30 capsule, Rfl: 0 .  insulin aspart (NOVOLOG) 100 UNIT/ML injection, 35 units per insulin pump daily (Patient taking differently: Inject 35 Units into the skin daily. insulin pump daily), Disp: 10 mL, Rfl: 11 .   ipratropium (ATROVENT) 0.06 % nasal spray, Place 2 sprays into both nostrils 4 (four) times daily., Disp: 15 mL, Rfl: 0 .  levothyroxine (SYNTHROID) 100 MCG tablet, Take 1 tablet (100 mcg total) by mouth daily before breakfast., Disp: 30 tablet, Rfl: 0 .  metoCLOPramide (REGLAN) 10 MG tablet, Take 1 tablet (10 mg total) by mouth every 8 (eight) hours as needed for nausea., Disp: 30 tablet, Rfl: 0 .  metoprolol tartrate (LOPRESSOR) 25 MG tablet, Take 1 tablet (25 mg total) by mouth 2 (two) times daily. Hold for SBP<90 and HR<50, Disp: 60 tablet, Rfl: 11 .  montelukast (SINGULAIR) 10 MG tablet, Take 1 tablet (10 mg total) by mouth at bedtime., Disp: 30 tablet, Rfl: 0 .  nitroGLYCERIN (NITROSTAT) 0.4 MG SL tablet, Place 1 tablet (0.4 mg total) under the tongue every 5 (five) minutes as needed for chest pain., Disp: 30 tablet, Rfl: 0 .  ondansetron (ZOFRAN) 4 MG tablet, Take 1 tablet (4 mg  total) by mouth every 8 (eight) hours as needed for nausea or vomiting., Disp: 20 tablet, Rfl: 0 .  pantoprazole (PROTONIX) 40 MG tablet, Take 1 tablet (40 mg total) by mouth daily., Disp: 30 tablet, Rfl: 0 .  rosuvastatin (CRESTOR) 40 MG tablet, Take 1 tablet (40 mg total) by mouth daily at 6 PM., Disp: 30 tablet, Rfl: 11 .  HYDROcodone-acetaminophen (NORCO) 5-325 MG tablet, Take 1 tablet by mouth every 4 (four) hours as needed (for pain). (Patient not taking: Reported on 03/04/2018), Disp: 20 tablet, Rfl: 0 .  isosorbide mononitrate (IMDUR) 30 MG 24 hr tablet, Take 0.5 tablets (15 mg total) by mouth daily. (Patient not taking: Reported on 03/04/2018), Disp: 30 tablet, Rfl: 11 No current facility-administered medications for this visit.   Facility-Administered Medications Ordered in Other Visits:  .  STUDY - AEGIS II - placebo or CSL112 (PI-[place]), 170 mL, Intravenous, Once, Hilty, Nadean Corwin, MD, Last Rate: 85 mL/hr at 03/04/18 1022, 170 mL at 03/04/18 1022

## 2018-03-05 ENCOUNTER — Ambulatory Visit: Payer: Self-pay | Admitting: Physician Assistant

## 2018-03-11 ENCOUNTER — Encounter: Payer: No Typology Code available for payment source | Admitting: *Deleted

## 2018-03-11 VITALS — BP 107/64 | HR 72 | Resp 18 | Wt 140.0 lb

## 2018-03-11 DIAGNOSIS — Z006 Encounter for examination for normal comparison and control in clinical research program: Secondary | ICD-10-CM

## 2018-03-11 MED FILL — !NOVOLOG 100UNITS/ML VIAL: 100/ML | 28 days supply | Qty: 10 | Fill #0

## 2018-03-11 MED FILL — ?PANTOPRAZOLE SOD DR 40MG T: 40 | 30 days supply | Qty: 30 | Fill #0

## 2018-03-11 NOTE — Research (Signed)
[facility] Visit 6  Pt feeling good, no chest pains or sob. Walked home from where her dad is staying at last night and didn't have any chest pains. No changes in meds. Patient states she is pretty sure she is going through pre-menopause, hadn't had period in 2 months. "No chance of being pregnant."   Central labs drawn today:                                   "CONSENT"   YES     NO   Continuing further Investigational Product and study visits for follow-up? [x]  []   Continuing consent from future biomedical research [x]  []                                    "EVENTS"    YES     NO  AE   (IF YES SEE SOURCE) []  [x]   SAE  (IF YES SEE SOURCE) []  [x]   ENDPOINT   (IF YES SEE SOURCE) []  [x]   REVASCULARIZATION  (IF YES SEE SOURCE) []  [x]   AMPUTATION   (IF YES SEE SOURCE) []  [x]   TROPONIN'S  (IF YES SEE SOURCE) []  [x]        Current Outpatient Medications:  .  aspirin 81 MG EC tablet, Take 1 tablet (81 mg total) by mouth daily., Disp: 90 tablet, Rfl: 0 .  clopidogrel (PLAVIX) 75 MG tablet, Take 1 tablet (75 mg total) by mouth daily with breakfast., Disp: 30 tablet, Rfl: 11 .  doxycycline (VIBRAMYCIN) 100 MG capsule, Take 1 capsule (100 mg total) by mouth 2 (two) times daily., Disp: 20 capsule, Rfl: 0 .  fluconazole (DIFLUCAN) 150 MG tablet, Take 1 tablet (150 mg total) by mouth daily. Take second dose 72 hours later if symptoms still persists., Disp: 2 tablet, Rfl: 0 .  FLUoxetine (PROZAC) 10 MG capsule, Take 1 capsule (10 mg total) by mouth daily., Disp: 30 capsule, Rfl: 0 .  FLUoxetine (PROZAC) 40 MG capsule, Take 1 capsule (40 mg total) by mouth daily., Disp: 30 capsule, Rfl: 0 .  insulin aspart (NOVOLOG) 100 UNIT/ML injection, 35 units per insulin pump daily (Patient taking differently: Inject 35 Units into the skin daily. insulin pump daily), Disp: 10 mL, Rfl: 11 .  ipratropium (ATROVENT) 0.06 % nasal spray, Place 2 sprays into both nostrils 4 (four) times daily., Disp: 15 mL, Rfl: 0 .  isosorbide  mononitrate (IMDUR) 30 MG 24 hr tablet, Take 0.5 tablets (15 mg total) by mouth daily., Disp: 30 tablet, Rfl: 11 .  levothyroxine (SYNTHROID) 100 MCG tablet, Take 1 tablet (100 mcg total) by mouth daily before breakfast., Disp: 30 tablet, Rfl: 0 .  metoCLOPramide (REGLAN) 10 MG tablet, Take 1 tablet (10 mg total) by mouth every 8 (eight) hours as needed for nausea., Disp: 30 tablet, Rfl: 0 .  metoprolol tartrate (LOPRESSOR) 25 MG tablet, Take 1 tablet (25 mg total) by mouth 2 (two) times daily. Hold for SBP<90 and HR<50, Disp: 60 tablet, Rfl: 11 .  montelukast (SINGULAIR) 10 MG tablet, Take 1 tablet (10 mg total) by mouth at bedtime., Disp: 30 tablet, Rfl: 0 .  nitroGLYCERIN (NITROSTAT) 0.4 MG SL tablet, Place 1 tablet (0.4 mg total) under the tongue every 5 (five) minutes as needed for chest pain., Disp: 30 tablet, Rfl: 0 .  ondansetron (ZOFRAN) 4 MG tablet, Take  1 tablet (4 mg total) by mouth every 8 (eight) hours as needed for nausea or vomiting., Disp: 20 tablet, Rfl: 0 .  pantoprazole (PROTONIX) 40 MG tablet, Take 1 tablet (40 mg total) by mouth daily., Disp: 30 tablet, Rfl: 0 .  rosuvastatin (CRESTOR) 40 MG tablet, Take 1 tablet (40 mg total) by mouth daily at 6 PM., Disp: 30 tablet, Rfl: 11 .  albuterol (PROVENTIL HFA;VENTOLIN HFA) 108 (90 Base) MCG/ACT inhaler, Inhale 2 puffs into the lungs every 6 (six) hours as needed for wheezing or shortness of breath., Disp: 1 Inhaler, Rfl: 0 .  HYDROcodone-acetaminophen (NORCO) 5-325 MG tablet, Take 1 tablet by mouth every 4 (four) hours as needed (for pain). (Patient not taking: Reported on 03/04/2018), Disp: 20 tablet, Rfl: 0

## 2018-03-15 NOTE — Research (Signed)
Visit 2 PK only

## 2018-03-19 ENCOUNTER — Telehealth: Payer: Self-pay | Admitting: Nutrition

## 2018-03-19 MED FILL — CLOPIDOGREL 75 MG TABLET: 75 | 30 days supply | Qty: 30 | Fill #0

## 2018-03-19 MED FILL — ISOSORBIDE MN ER 30 MG TAB: 30 | 30 days supply | Qty: 15 | Fill #0

## 2018-03-19 MED FILL — METOPROLOL TARTRATE 25 MG T: 25 | 30 days supply | Qty: 60 | Fill #0 | Status: TO

## 2018-03-19 MED FILL — ROSUVASTATIN CALCIUM 40 MG: 40 | 30 days supply | Qty: 30 | Fill #0 | Status: TO

## 2018-03-19 MED FILL — FLUoxetine HCL 40 MG CAPS: 40 | 30 days supply | Qty: 30 | Fill #0

## 2018-03-19 NOTE — Telephone Encounter (Signed)
Message left on machine that she started herself on her 670G pump, but needed training on CGM.  I phoned her back and left message with times and dates she can be worked in this week.

## 2018-03-21 ENCOUNTER — Ambulatory Visit: Payer: Self-pay

## 2018-03-25 ENCOUNTER — Ambulatory Visit: Payer: Self-pay | Admitting: Gastroenterology

## 2018-03-27 ENCOUNTER — Ambulatory Visit: Payer: Self-pay | Admitting: Licensed Clinical Social Worker

## 2018-03-27 ENCOUNTER — Encounter: Payer: Self-pay | Admitting: Nurse Practitioner

## 2018-03-27 ENCOUNTER — Ambulatory Visit: Payer: Self-pay | Attending: Family Medicine | Admitting: Nurse Practitioner

## 2018-03-27 VITALS — BP 96/62 | HR 68 | Temp 99.1°F | Ht 65.0 in | Wt 152.0 lb

## 2018-03-27 DIAGNOSIS — E108 Type 1 diabetes mellitus with unspecified complications: Secondary | ICD-10-CM

## 2018-03-27 DIAGNOSIS — F331 Major depressive disorder, recurrent, moderate: Secondary | ICD-10-CM

## 2018-03-27 DIAGNOSIS — F411 Generalized anxiety disorder: Secondary | ICD-10-CM

## 2018-03-27 DIAGNOSIS — Z09 Encounter for follow-up examination after completed treatment for conditions other than malignant neoplasm: Secondary | ICD-10-CM

## 2018-03-27 DIAGNOSIS — K219 Gastro-esophageal reflux disease without esophagitis: Secondary | ICD-10-CM

## 2018-03-27 LAB — GLUCOSE, POCT (MANUAL RESULT ENTRY): POC Glucose: 177 mg/dl — AB (ref 70–99)

## 2018-03-27 MED ORDER — PANTOPRAZOLE SODIUM 40 MG PO TBEC: 40.0000 mg | DELAYED_RELEASE_TABLET | Freq: Two times a day (BID) | ORAL | 0 refills | Status: DC

## 2018-03-27 NOTE — Progress Notes (Signed)
Assessment & Plan:  Jennifer Hogan was seen today for hospitalization follow-up.  Diagnoses and all orders for this visit:  Hospital discharge follow-up -     Ambulatory referral to Cardiology  Moderate episode of recurrent major depressive disorder ([place]) -     Consult to social work Continue Prozac as prescribed. Lab Results  Component Value Date   TSH 0.934 02/08/2018    Type 1 diabetes mellitus with complication (HCC) -     Glucose (CBG) Follow-up with endocrinology as instructed.  Gastroesophageal reflux disease, esophagitis presence not specified -     pantoprazole (PROTONIX) 40 MG tablet; Take 1 tablet (40 mg total) by mouth 2 (two) times daily. INSTRUCTIONS: Avoid GERD Triggers: acidic, spicy or fried foods, caffeine, coffee, sodas,  alcohol and chocolate.   Patient has been counseled on age-appropriate routine health concerns for screening and prevention. These are reviewed and up-to-date. Referrals have been placed accordingly. Immunizations are up-to-date or declined.    Subjective:   Chief Complaint  Patient presents with  . Hospitalization Follow-up    Pt. is here for HFU.    HPI Keyshawna Prouse Hogan 48 y.o. female presents to office today for hospital follow up.  She has a history of CAD status post non-STEMI and PCI to large ramus and LAD (February 2019) hypothyroidism, diabetes mellitus type 1 on insulin pump being followed by endocrinology.  Hospital Follow Up She was admitted to the hospital from January 17 through February 11, 2018. She presented to the ED with chest pain, tachycardia and was mildly hypertensive with abnormal EKG and patient was sent to the Cath Lab. Per cath note, PTCA/DES to proximal/ostial circumflex was placed with 2.5 x 15 Onyx.  Prior to discharge she was with Crestor 40 mg due to hyperlipidemia she was also entered into AEGIS II clinical trial.  LDL was 177 at that time. A few days later after discharge on February 14, 2018 she was admitted for  non-intractable vomiting with nausea which was suspected to be related to gastroparesis.  She was started on Reglan IV which was transitioned to Reglan p.o. 10 mg every 8 hours however today she reports she has never picked up the Reglan prescription.  Currently denies any nausea or vomiting.  She does note with abdominal distention she takes an over-the-counter stimulant laxative which provides relief of her symptoms within 24 hours.  She does have an upcoming appointment with gastroenterology next month.  She currently has increased her Protonix to 40 mg twice daily.   Depression Today she notes significant improvement in all of her symptoms and she currently denies any nausea or vomiting, chest pain, shortness of breath, fatigue, palpitations.  She does endorse increased stress as her father is currently in palliative care and is having health issues as well.  Taking Prozac as prescribed however feels helpless with the burden of taking care of both parents.  States "I feel helpless with my dad".  She is very interested in psychotherapy however there is some knowledge deficit regarding resources for the uninsured.  I will have the social worker come speak to her regarding behavioral health resources.  She currently denies any suicidal ideation or thoughts of self-harm.   ROS  Past Medical History:  Diagnosis Date  . Allergic state 08/07/2015  . Anemia   . Anxiety   . Coronary artery disease    a. s/pPTCA DES x3 in the LAD (ostial DES placed, mid DES placed, distal DES placed)and PTCA/DES x1to intermediate branch 02/2017.  b. STEMI 02/08/18 with LCx stent placement.  . Depression   . Diabetes mellitus    type 1, on insulin pump  . DKA (diabetic ketoacidoses) ([place]) 12/31/2017  . Elevated platelet count   . High cholesterol   . Hypothyroidism   . Vomiting and diarrhea 12/31/2017    Past Surgical History:  Procedure Laterality Date  . APPENDECTOMY    . CORONARY STENT INTERVENTION N/A  03/20/2017   Procedure: DES x 3 LAD, DES OM; Surgeon: Burnell Blanks, MD;  Location: [facility];  Service: Cardiovascular;  Laterality: N/A;  . CORONARY/GRAFT ACUTE MI REVASCULARIZATION N/A 02/08/2018   Procedure: Coronary/Graft Acute MI Revascularization;  Surgeon: Belva Crome, MD;  Location: [facility];  Service: Cardiovascular;  Laterality: N/A;  . LEFT HEART CATH AND CORONARY ANGIOGRAPHY N/A 03/20/2017   Procedure: LEFT HEART CATH AND CORONARY ANGIOGRAPHY;  Surgeon: Burnell Blanks, MD;  Location: [facility];  Service: Cardiovascular;  Laterality: N/A;  . LEFT HEART CATH AND CORONARY ANGIOGRAPHY N/A 02/08/2018   Procedure: LEFT HEART CATH AND CORONARY ANGIOGRAPHY;  Surgeon: Belva Crome, MD;  Location: [facility];  Service: Cardiovascular;  Laterality: N/A;    Family History  Problem Relation Age of Onset  . Coronary artery disease Father   . Congestive Heart Failure Father   . Asthma Father   . Cancer Mother        T cell lymphoma    Social History Reviewed with no changes to be made today.   Outpatient Medications Prior to Visit  Medication Sig Dispense Refill  . aspirin 81 MG EC tablet Take 1 tablet (81 mg total) by mouth daily. 90 tablet 0  . clopidogrel (PLAVIX) 75 MG tablet Take 1 tablet (75 mg total) by mouth daily with breakfast. 30 tablet 11  . insulin aspart (NOVOLOG) 100 UNIT/ML injection 35 units per insulin pump daily (Patient taking differently: Inject 35 Units into the skin daily. insulin pump daily) 10 mL 11  . ipratropium (ATROVENT) 0.06 % nasal spray Place 2 sprays into both nostrils 4 (four) times daily. 15 mL 0  . isosorbide mononitrate (IMDUR) 30 MG 24 hr tablet Take 0.5 tablets (15 mg total) by mouth daily. 30 tablet 11  . metoprolol tartrate (LOPRESSOR) 25 MG tablet Take 1 tablet (25 mg total) by mouth 2 (two) times daily. Hold for SBP<90 and HR<50 60 tablet 11  . nitroGLYCERIN (NITROSTAT) 0.4 MG SL tablet Place  1 tablet (0.4 mg total) under the tongue every 5 (five) minutes as needed for chest pain. 30 tablet 0  . ondansetron (ZOFRAN) 4 MG tablet Take 1 tablet (4 mg total) by mouth every 8 (eight) hours as needed for nausea or vomiting. 20 tablet 0  . rosuvastatin (CRESTOR) 40 MG tablet Take 1 tablet (40 mg total) by mouth daily at 6 PM. 30 tablet 11  . albuterol (PROVENTIL HFA;VENTOLIN HFA) 108 (90 Base) MCG/ACT inhaler Inhale 2 puffs into the lungs every 6 (six) hours as needed for wheezing or shortness of breath. 1 Inhaler 0  . doxycycline (VIBRAMYCIN) 100 MG capsule Take 1 capsule (100 mg total) by mouth 2 (two) times daily. (Patient not taking: Reported on 03/27/2018) 20 capsule 0  . fluconazole (DIFLUCAN) 150 MG tablet Take 1 tablet (150 mg total) by mouth daily. Take second dose 72 hours later if symptoms still persists. (Patient not taking: Reported on 03/27/2018) 2 tablet 0  . FLUoxetine (PROZAC) 10 MG capsule Take  1 capsule (10 mg total) by mouth daily. 30 capsule 0  . FLUoxetine (PROZAC) 40 MG capsule Take 1 capsule (40 mg total) by mouth daily. 30 capsule 0  . HYDROcodone-acetaminophen (NORCO) 5-325 MG tablet Take 1 tablet by mouth every 4 (four) hours as needed (for pain). (Patient not taking: Reported on 03/04/2018) 20 tablet 0  . levothyroxine (SYNTHROID) 100 MCG tablet Take 1 tablet (100 mcg total) by mouth daily before breakfast. 30 tablet 0  . montelukast (SINGULAIR) 10 MG tablet Take 1 tablet (10 mg total) by mouth at bedtime. 30 tablet 0  . metoCLOPramide (REGLAN) 10 MG tablet Take 1 tablet (10 mg total) by mouth every 8 (eight) hours as needed for nausea. (Patient not taking: Reported on 03/27/2018) 30 tablet 0  . pantoprazole (PROTONIX) 40 MG tablet Take 1 tablet (40 mg total) by mouth daily. (Patient taking differently: Take 40 mg by mouth 2 (two) times daily. ) 30 tablet 0   No facility-administered medications prior to visit.     Allergies  Allergen Reactions  . Ciprofloxin Hcl  [Ciprofloxacin] Nausea And Vomiting and Other (See Comments)    Severe migraine  . Food Color Red [Red Dye] Diarrhea       Objective:    BP 96/62 (BP Location: Left Arm, Patient Position: Sitting, Cuff Size: Normal)   Pulse 68   Temp 99.1 F (37.3 C) (Oral)   Ht 5\' 5"  (1.651 m)   Wt 152 lb (68.9 kg)   SpO2 99%   BMI 25.29 kg/m  Wt Readings from Last 3 Encounters:  03/27/18 152 lb (68.9 kg)  03/11/18 140 lb (63.5 kg)  03/04/18 143 lb (64.9 kg)    Physical Exam       Patient has been counseled extensively about nutrition and exercise as well as the importance of adherence with medications and regular follow-up. The patient was given clear instructions to go to ER or return to medical center if symptoms don't improve, worsen or new problems develop. The patient verbalized understanding.   Follow-up: Return if symptoms worsen or fail to improve.   [name], FNP-BC [facility], [place]   03/27/2018, 6:03 PM

## 2018-03-28 NOTE — BH Specialist Note (Signed)
Integrated Behavioral Health Initial Visit  MRN: [id number] Name: Jennifer Hogan  Number of [place] Clinician visits:: 1/6 Session Start time: 4:00PM  Session End time: 4:30PM Total time: 30 minutes  Type of Service: [place] Off Completed.       SUBJECTIVE: Jennifer Hogan is a 48 y.o. female accompanied by SELF Patient was referred by PCP Archie Patten for depression and anxiety. Patient reports the following symptoms/concerns: Pt has been having depressive and anxiety symptoms. Pt reports difficulty sleeping and coping with loss of employment. Duration of problem: 5 months; Severity of problem: moderate  OBJECTIVE: Mood: Anxious and Affect: Appropriate Risk of harm to self or others: No plan to harm self or others  LIFE CONTEXT: Family and Social: Pt currently receives support from mother and father.  School/Work: Pt lost employment in November 2019 and has been receiving financial support from mother.  Self-Care: Pt denies any substance use. Reports past use of marijuana.  Life Changes: Pt has been unable to find employment in her desired field of interest. Pt also reports her father has been placed in an assisted living facility and her mother has been diagnosed with cancer.   GOALS ADDRESSED: Patient will: 1. Reduce symptoms of: anxiety, depression, insomnia and stress 2. Increase knowledge and/or ability of: coping skills, self-management skills and stress reduction  3. Demonstrate ability to: Increase healthy adjustment to current life circumstances  INTERVENTIONS: Interventions utilized: Motivational Interviewing, Mindfulness or Psychologist, educational, Veterinary surgeon, Sleep Hygiene and Link to [employer]  Standardized Assessments completed: GAD-7 and PHQ 2&9 with C-SSRS  ASSESSMENT: Patient currently experiencing depression and anxiety symptoms. Pt endorses feelings  of wishing to be dead without suicidal ideations/intent/plan. No HI. Pt has difficulty falling asleep. Reports excessive sadness. Pt shared that she has went to therapy in the past and believes it was beneficial. Reports that she wants to begin therapy. MSW intern provided pt with behavioral health resources. Provided pt with relaxation techniques..   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 Pt plans to search for a summer teaching position. Pt's strength is her love for helping children and those in her family. Pt's mother has been supporting her financially. Pt reports her mother has cancer and her father is in an assisted living facility. MSW intern provided pt with information on cancer support groups.    Patient may benefit from psychotherapy and support groups alongside mother. Pt may also benefit from job programs. 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   PLAN: 1. Follow up with behavioral health clinician on : MSW intern encouraged pt to schedule appt if needed 2. Behavioral recommendations: Encouraged pt to engage in relaxation techniques to assist with stress and anxiety and attend provided behavioral health resources. 3. Referral(s): [place] (In Clinic) and [place] (LME/Outside Clinic) 4. "From scale of 1-10, how likely are you to follow plan?": 7  Ruffin Pyo, MSW Intern 03/29/2018, 12:06PM

## 2018-04-10 ENCOUNTER — Ambulatory Visit: Payer: Self-pay

## 2018-04-12 ENCOUNTER — Telehealth: Payer: Self-pay | Admitting: *Deleted

## 2018-04-18 ENCOUNTER — Telehealth (HOSPITAL_COMMUNITY): Payer: Self-pay

## 2018-04-18 NOTE — Telephone Encounter (Signed)
Called pt and LMTCB to see if her insurance went through. Will place in the black box.for cardiac rehab. Tedra [place]. Support Rep II

## 2018-04-22 ENCOUNTER — Ambulatory Visit: Payer: No Typology Code available for payment source | Admitting: Endocrinology

## 2018-04-24 ENCOUNTER — Telehealth: Payer: Self-pay | Admitting: *Deleted

## 2018-04-24 ENCOUNTER — Encounter: Payer: Self-pay | Admitting: *Deleted

## 2018-04-24 DIAGNOSIS — Z006 Encounter for examination for normal comparison and control in clinical research program: Secondary | ICD-10-CM

## 2018-04-24 NOTE — Research (Signed)
[facility] Visit 7 phone visit Pt is doing well, no complaints of chest pain or sob. I asked about her parents they are doing well, she said its tough not being able to see her dad at this time due to nursing home being on lock down but she understands.  No med changes.  Will follow back up with patient in about 30-45 days for an office visit depending on if the pandemic is still going on.                                     "CONSENT"   YES     NO   Continuing further Investigational Product and study visits for follow-up? [x]  []   Continuing consent from future biomedical research [x]  []                                    "EVENTS"    YES     NO  AE   (IF YES SEE SOURCE) []  [x]   SAE  (IF YES SEE SOURCE) []  [x]   ENDPOINT   (IF YES SEE SOURCE) []  [x]   REVASCULARIZATION  (IF YES SEE SOURCE) []  [x]   AMPUTATION   (IF YES SEE SOURCE) []  [x]   TROPONIN'S  (IF YES SEE SOURCE) []  [x]     Current Outpatient Medications:  .  aspirin 81 MG EC tablet, Take 1 tablet (81 mg total) by mouth daily., Disp: 90 tablet, Rfl: 0 .  clopidogrel (PLAVIX) 75 MG tablet, Take 1 tablet (75 mg total) by mouth daily with breakfast., Disp: 30 tablet, Rfl: 11 .  insulin aspart (NOVOLOG) 100 UNIT/ML injection, 35 units per insulin pump daily (Patient taking differently: Inject 35 Units into the skin daily. insulin pump daily), Disp: 10 mL, Rfl: 11 .  ipratropium (ATROVENT) 0.06 % nasal spray, Place 2 sprays into both nostrils 4 (four) times daily., Disp: 15 mL, Rfl: 0 .  isosorbide mononitrate (IMDUR) 30 MG 24 hr tablet, Take 0.5 tablets (15 mg total) by mouth daily., Disp: 30 tablet, Rfl: 11 .  levothyroxine (SYNTHROID) 100 MCG tablet, Take 1 tablet (100 mcg total) by mouth daily before breakfast., Disp: 30 tablet, Rfl: 0 .  metoprolol tartrate (LOPRESSOR) 25 MG tablet, Take 1 tablet (25 mg total) by mouth 2 (two) times daily. Hold for SBP<90 and HR<50, Disp: 60 tablet, Rfl: 11 .  nitroGLYCERIN (NITROSTAT) 0.4 MG SL tablet, Place  1 tablet (0.4 mg total) under the tongue every 5 (five) minutes as needed for chest pain., Disp: 30 tablet, Rfl: 0 .  ondansetron (ZOFRAN) 4 MG tablet, Take 1 tablet (4 mg total) by mouth every 8 (eight) hours as needed for nausea or vomiting., Disp: 20 tablet, Rfl: 0 .  pantoprazole (PROTONIX) 40 MG tablet, Take 1 tablet (40 mg total) by mouth 2 (two) times daily., Disp: 70 tablet, Rfl: 0 .  rosuvastatin (CRESTOR) 40 MG tablet, Take 1 tablet (40 mg total) by mouth daily at 6 PM., Disp: 30 tablet, Rfl: 11 .  albuterol (PROVENTIL HFA;VENTOLIN HFA) 108 (90 Base) MCG/ACT inhaler, Inhale 2 puffs into the lungs every 6 (six) hours as needed for wheezing or shortness of breath., Disp: 1 Inhaler, Rfl: 0 .  FLUoxetine (PROZAC) 10 MG capsule, Take 1 capsule (10 mg total) by mouth daily., Disp: 30 capsule, Rfl: 0 .  FLUoxetine (PROZAC) 40 MG  capsule, Take 1 capsule (40 mg total) by mouth daily., Disp: 30 capsule, Rfl: 0 .  montelukast (SINGULAIR) 10 MG tablet, Take 1 tablet (10 mg total) by mouth at bedtime., Disp: 30 tablet, Rfl: 0

## 2018-04-24 NOTE — Telephone Encounter (Signed)
L/M TO CONTACT THE OFFICE   Needs to see if she is willing to do [facility] appt

## 2018-04-26 NOTE — Telephone Encounter (Signed)
Patient called to cancel appointment at this time but will reschedule at a later time.

## 2018-05-01 ENCOUNTER — Encounter

## 2018-05-01 ENCOUNTER — Ambulatory Visit: Payer: Self-pay | Admitting: Gastroenterology

## 2018-05-08 NOTE — Telephone Encounter (Signed)
Pt called back. °

## 2018-05-20 ENCOUNTER — Encounter: Payer: Self-pay | Admitting: Nurse Practitioner

## 2018-05-20 DIAGNOSIS — J309 Allergic rhinitis, unspecified: Secondary | ICD-10-CM

## 2018-05-20 MED ORDER — MONTELUKAST SODIUM 10 MG PO TABS: 10.0000 mg | ORAL_TABLET | Freq: Every day | ORAL | 0 refills | Status: DC

## 2018-05-20 NOTE — Telephone Encounter (Signed)
Please address refill if appropriate

## 2018-05-24 ENCOUNTER — Telehealth: Payer: Self-pay

## 2018-05-24 ENCOUNTER — Other Ambulatory Visit: Payer: Self-pay

## 2018-05-24 ENCOUNTER — Inpatient Hospital Stay (HOSPITAL_COMMUNITY)
Admission: EM | Admit: 2018-05-24 | Discharge: 2018-05-27 | DRG: 638 | Disposition: A | Discharge status: Home / Self Care | Payer: Self-pay | Attending: Internal Medicine | Admitting: Internal Medicine

## 2018-05-24 ENCOUNTER — Emergency Department (HOSPITAL_COMMUNITY): Payer: Self-pay

## 2018-05-24 DIAGNOSIS — F329 Major depressive disorder, single episode, unspecified: Secondary | ICD-10-CM | POA: Diagnosis present

## 2018-05-24 DIAGNOSIS — E039 Hypothyroidism, unspecified: Secondary | ICD-10-CM | POA: Diagnosis present

## 2018-05-24 DIAGNOSIS — E1065 Type 1 diabetes mellitus with hyperglycemia: Secondary | ICD-10-CM

## 2018-05-24 DIAGNOSIS — Z79899 Other long term (current) drug therapy: Secondary | ICD-10-CM

## 2018-05-24 DIAGNOSIS — Z8249 Family history of ischemic heart disease and other diseases of the circulatory system: Secondary | ICD-10-CM

## 2018-05-24 DIAGNOSIS — E7800 Pure hypercholesterolemia, unspecified: Secondary | ICD-10-CM | POA: Diagnosis present

## 2018-05-24 DIAGNOSIS — R112 Nausea with vomiting, unspecified: Secondary | ICD-10-CM

## 2018-05-24 DIAGNOSIS — Z7982 Long term (current) use of aspirin: Secondary | ICD-10-CM

## 2018-05-24 DIAGNOSIS — R1013 Epigastric pain: Secondary | ICD-10-CM | POA: Diagnosis present

## 2018-05-24 DIAGNOSIS — Z881 Allergy status to other antibiotic agents status: Secondary | ICD-10-CM

## 2018-05-24 DIAGNOSIS — Z825 Family history of asthma and other chronic lower respiratory diseases: Secondary | ICD-10-CM

## 2018-05-24 DIAGNOSIS — R7989 Other specified abnormal findings of blood chemistry: Secondary | ICD-10-CM | POA: Diagnosis present

## 2018-05-24 DIAGNOSIS — Z794 Long term (current) use of insulin: Secondary | ICD-10-CM

## 2018-05-24 DIAGNOSIS — K219 Gastro-esophageal reflux disease without esophagitis: Secondary | ICD-10-CM | POA: Diagnosis present

## 2018-05-24 DIAGNOSIS — R197 Diarrhea, unspecified: Secondary | ICD-10-CM

## 2018-05-24 DIAGNOSIS — Z9641 Presence of insulin pump (external) (internal): Secondary | ICD-10-CM | POA: Diagnosis present

## 2018-05-24 DIAGNOSIS — T85694A Other mechanical complication of insulin pump, initial encounter: Secondary | ICD-10-CM | POA: Diagnosis not present

## 2018-05-24 DIAGNOSIS — Z807 Family history of other malignant neoplasms of lymphoid, hematopoietic and related tissues: Secondary | ICD-10-CM

## 2018-05-24 DIAGNOSIS — I10 Essential (primary) hypertension: Secondary | ICD-10-CM | POA: Diagnosis present

## 2018-05-24 DIAGNOSIS — I248 Other forms of acute ischemic heart disease: Secondary | ICD-10-CM | POA: Diagnosis present

## 2018-05-24 DIAGNOSIS — Z7902 Long term (current) use of antithrombotics/antiplatelets: Secondary | ICD-10-CM

## 2018-05-24 DIAGNOSIS — E1010 Type 1 diabetes mellitus with ketoacidosis without coma: Principal | ICD-10-CM | POA: Diagnosis present

## 2018-05-24 DIAGNOSIS — I252 Old myocardial infarction: Secondary | ICD-10-CM

## 2018-05-24 DIAGNOSIS — Z955 Presence of coronary angioplasty implant and graft: Secondary | ICD-10-CM

## 2018-05-24 DIAGNOSIS — K3184 Gastroparesis: Secondary | ICD-10-CM | POA: Diagnosis present

## 2018-05-24 DIAGNOSIS — Z79890 Hormone replacement therapy (postmenopausal): Secondary | ICD-10-CM

## 2018-05-24 DIAGNOSIS — E785 Hyperlipidemia, unspecified: Secondary | ICD-10-CM | POA: Diagnosis present

## 2018-05-24 DIAGNOSIS — I25119 Atherosclerotic heart disease of native coronary artery with unspecified angina pectoris: Secondary | ICD-10-CM

## 2018-05-24 DIAGNOSIS — E1043 Type 1 diabetes mellitus with diabetic autonomic (poly)neuropathy: Secondary | ICD-10-CM | POA: Diagnosis present

## 2018-05-24 DIAGNOSIS — I255 Ischemic cardiomyopathy: Secondary | ICD-10-CM | POA: Diagnosis present

## 2018-05-24 DIAGNOSIS — I2510 Atherosclerotic heart disease of native coronary artery without angina pectoris: Secondary | ICD-10-CM | POA: Diagnosis present

## 2018-05-24 LAB — BASIC METABOLIC PANEL
Anion gap: 17 — ABNORMAL HIGH (ref 5–15)
BUN: 10 mg/dL (ref 6–20)
CO2: 19 mmol/L — ABNORMAL LOW (ref 22–32)
Calcium: 9.8 mg/dL (ref 8.9–10.3)
Chloride: 100 mmol/L (ref 98–111)
Creatinine, Ser: 1 mg/dL (ref 0.44–1.00)
GFR calc Af Amer: >60 mL/min (ref >60)
GFR calc non Af Amer: >60 mL/min (ref >60)
Glucose, Bld: 349 mg/dL — ABNORMAL HIGH (ref 70–99)
Potassium: 3.9 mmol/L (ref 3.5–5.1)
Sodium: 136 mmol/L (ref 135–145)

## 2018-05-24 LAB — URINALYSIS, ROUTINE W REFLEX MICROSCOPIC
Bacteria, UA: NONE SEEN
Bilirubin Urine: NEGATIVE
Glucose, UA: >=500 mg/dL — AB
Ketones, ur: 20 mg/dL — AB
Leukocytes,Ua: NEGATIVE
Nitrite: NEGATIVE
Protein, ur: NEGATIVE mg/dL
RBC / HPF: >50 RBC/hpf — ABNORMAL HIGH (ref 0–5)
Specific Gravity, Urine: 1.014 (ref 1.005–1.030)
pH: 8 (ref 5.0–8.0)

## 2018-05-24 LAB — POCT I-STAT EG7
Acid-base deficit: 2 mmol/L (ref 0.0–2.0)
Bicarbonate: 20.5 mmol/L (ref 20.0–28.0)
Calcium, Ion: 1.09 mmol/L — ABNORMAL LOW (ref 1.15–1.40)
HCT: 39 % (ref 36.0–46.0)
Hemoglobin: 13.3 g/dL (ref 12.0–15.0)
O2 Saturation: 97 %
Potassium: 3.6 mmol/L (ref 3.5–5.1)
Sodium: 137 mmol/L (ref 135–145)
TCO2: 21 mmol/L — ABNORMAL LOW (ref 22–32)
pCO2, Ven: 29 mmHg — ABNORMAL LOW (ref 44.0–60.0)
pH, Ven: 7.457 — ABNORMAL HIGH (ref 7.250–7.430)
pO2, Ven: 85 mmHg — ABNORMAL HIGH (ref 32.0–45.0)

## 2018-05-24 LAB — HEPATIC FUNCTION PANEL
ALT: 24 U/L (ref 0–44)
AST: 40 U/L (ref 15–41)
Albumin: 4.4 g/dL (ref 3.5–5.0)
Alkaline Phosphatase: 109 U/L (ref 38–126)
Bilirubin, Direct: 0.2 mg/dL (ref 0.0–0.2)
Indirect Bilirubin: 0.8 mg/dL (ref 0.3–0.9)
Total Bilirubin: 1 mg/dL (ref 0.3–1.2)
Total Protein: 8.9 g/dL — ABNORMAL HIGH (ref 6.5–8.1)

## 2018-05-24 LAB — CBC
HCT: 38.1 % (ref 36.0–46.0)
Hemoglobin: 12.5 g/dL (ref 12.0–15.0)
MCH: 29 pg (ref 26.0–34.0)
MCHC: 32.8 g/dL (ref 30.0–36.0)
MCV: 88.4 fL (ref 80.0–100.0)
Platelets: 484 10*3/uL — ABNORMAL HIGH (ref 150–400)
RBC: 4.31 MIL/uL (ref 3.87–5.11)
RDW: 13.8 % (ref 11.5–15.5)
WBC: 12 10*3/uL — ABNORMAL HIGH (ref 4.0–10.5)
nRBC: 0 % (ref 0.0–0.2)

## 2018-05-24 LAB — GLUCOSE, CAPILLARY
Glucose-Capillary: 189 mg/dL — ABNORMAL HIGH (ref 70–99)
Glucose-Capillary: 210 mg/dL — ABNORMAL HIGH (ref 70–99)
Glucose-Capillary: 235 mg/dL — ABNORMAL HIGH (ref 70–99)

## 2018-05-24 LAB — CBG MONITORING, ED: Glucose-Capillary: 261 mg/dL — ABNORMAL HIGH (ref 70–99)

## 2018-05-24 LAB — TROPONIN I
Troponin I: 0.07 ng/mL (ref <0.03)
Troponin I: 0.1 ng/mL (ref <0.03)

## 2018-05-24 LAB — I-STAT BETA HCG BLOOD, ED (MC, WL, AP ONLY): I-stat hCG, quantitative: <5 m[IU]/mL (ref <5)

## 2018-05-24 LAB — BETA-HYDROXYBUTYRIC ACID: Beta-Hydroxybutyric Acid: 0.88 mmol/L — ABNORMAL HIGH (ref 0.05–0.27)

## 2018-05-24 LAB — LIPASE, BLOOD: Lipase: 21 U/L (ref 11–51)

## 2018-05-24 MED ORDER — DEXTROSE-NACL 5-0.45 % IV SOLN
INTRAVENOUS | Status: DC
  Administered 2018-05-24: 22:00:00 via INTRAVENOUS

## 2018-05-24 MED ORDER — ENOXAPARIN SODIUM 40 MG/0.4ML ~~LOC~~ SOLN
40.0000 mg | SUBCUTANEOUS | Status: DC
  Administered 2018-05-24 – 2018-05-26 (×3): 40 mg via SUBCUTANEOUS
  Filled 2018-05-24 (×4): qty 0.4

## 2018-05-24 MED ORDER — PANTOPRAZOLE SODIUM 40 MG PO TBEC
40.0000 mg | DELAYED_RELEASE_TABLET | Freq: Two times a day (BID) | ORAL | Status: DC
  Administered 2018-05-24 – 2018-05-27 (×6): 40 mg via ORAL
  Filled 2018-05-24 (×6): qty 1

## 2018-05-24 MED ORDER — PROMETHAZINE HCL 25 MG PO TABS: 12.5000 mg | ORAL_TABLET | Freq: Four times a day (QID) | ORAL | Status: DC | PRN

## 2018-05-24 MED ORDER — ONDANSETRON HCL 4 MG PO TABS
4.0000 mg | ORAL_TABLET | Freq: Three times a day (TID) | ORAL | Status: DC | PRN
  Administered 2018-05-25: 4 mg via ORAL
  Filled 2018-05-24: qty 1

## 2018-05-24 MED ORDER — CLOPIDOGREL BISULFATE 75 MG PO TABS
75.0000 mg | ORAL_TABLET | Freq: Every day | ORAL | Status: DC
  Administered 2018-05-25 – 2018-05-27 (×3): 75 mg via ORAL
  Filled 2018-05-24 (×3): qty 1

## 2018-05-24 MED ORDER — NITROGLYCERIN 0.4 MG SL SUBL: 0.4000 mg | SUBLINGUAL_TABLET | SUBLINGUAL | Status: DC | PRN

## 2018-05-24 MED ORDER — METOPROLOL TARTRATE 25 MG PO TABS
25.0000 mg | ORAL_TABLET | Freq: Two times a day (BID) | ORAL | Status: DC
  Administered 2018-05-24 – 2018-05-25 (×2): 25 mg via ORAL
  Filled 2018-05-24 (×2): qty 1

## 2018-05-24 MED ORDER — SODIUM CHLORIDE 0.9 % IV BOLUS
1000.0000 mL | Freq: Once | INTRAVENOUS | Status: AC
  Administered 2018-05-24: 1000 mL via INTRAVENOUS

## 2018-05-24 MED ORDER — PROMETHAZINE HCL 12.5 MG RE SUPP
12.5000 mg | Freq: Four times a day (QID) | RECTAL | Status: DC | PRN
  Filled 2018-05-24: qty 2

## 2018-05-24 MED ORDER — ASPIRIN 81 MG PO CHEW
324.0000 mg | CHEWABLE_TABLET | Freq: Once | ORAL | Status: AC
  Administered 2018-05-24: 22:00:00 324 mg via ORAL
  Filled 2018-05-24: qty 4

## 2018-05-24 MED ORDER — ONDANSETRON HCL 4 MG/2ML IJ SOLN
4.0000 mg | Freq: Once | INTRAMUSCULAR | Status: AC
  Administered 2018-05-24: 22:00:00 4 mg via INTRAVENOUS
  Filled 2018-05-24: qty 2

## 2018-05-24 MED ORDER — POTASSIUM CHLORIDE 10 MEQ/100ML IV SOLN
10.0000 meq | INTRAVENOUS | Status: AC
  Administered 2018-05-24 (×2): 10 meq via INTRAVENOUS
  Filled 2018-05-24 (×2): qty 100

## 2018-05-24 MED ORDER — IPRATROPIUM BROMIDE 0.06 % NA SOLN
2.0000 | Freq: Four times a day (QID) | NASAL | Status: DC
  Administered 2018-05-25 – 2018-05-26 (×5): 2 via NASAL
  Filled 2018-05-24: qty 15

## 2018-05-24 MED ORDER — INSULIN REGULAR(HUMAN) IN NACL 100-0.9 UT/100ML-% IV SOLN
INTRAVENOUS | Status: DC
  Administered 2018-05-24: 2 [IU]/h via INTRAVENOUS
  Filled 2018-05-24: qty 100

## 2018-05-24 MED ORDER — ALBUTEROL SULFATE (2.5 MG/3ML) 0.083% IN NEBU: 2.5000 mg | INHALATION_SOLUTION | Freq: Four times a day (QID) | RESPIRATORY_TRACT | Status: DC | PRN

## 2018-05-24 MED ORDER — LORAZEPAM 2 MG/ML IJ SOLN
0.5000 mg | Freq: Once | INTRAMUSCULAR | Status: AC
  Administered 2018-05-24: 0.5 mg via INTRAVENOUS
  Filled 2018-05-24: qty 1

## 2018-05-24 MED ORDER — FENTANYL CITRATE (PF) 100 MCG/2ML IJ SOLN
50.0000 ug | Freq: Once | INTRAMUSCULAR | Status: AC
  Administered 2018-05-24: 50 ug via INTRAVENOUS
  Filled 2018-05-24: qty 2

## 2018-05-24 MED ORDER — LEVOTHYROXINE SODIUM 100 MCG PO TABS
100.0000 ug | ORAL_TABLET | Freq: Every day | ORAL | Status: DC
  Administered 2018-05-25 – 2018-05-27 (×3): 100 ug via ORAL
  Filled 2018-05-24 (×3): qty 1

## 2018-05-24 MED ORDER — SODIUM CHLORIDE 0.9 % IV SOLN: INTRAVENOUS | Status: DC

## 2018-05-24 MED ORDER — PROMETHAZINE HCL 25 MG/ML IJ SOLN
12.5000 mg | Freq: Four times a day (QID) | INTRAMUSCULAR | Status: DC | PRN
  Administered 2018-05-24: 25 mg via INTRAVENOUS
  Filled 2018-05-24 (×2): qty 1

## 2018-05-24 MED ORDER — DEXTROSE-NACL 5-0.45 % IV SOLN: INTRAVENOUS | Status: DC

## 2018-05-24 MED ORDER — ROSUVASTATIN CALCIUM 20 MG PO TABS
40.0000 mg | ORAL_TABLET | Freq: Every day | ORAL | Status: DC
  Administered 2018-05-25 – 2018-05-26 (×2): 40 mg via ORAL
  Filled 2018-05-24 (×2): qty 2

## 2018-05-24 MED ORDER — LACTATED RINGERS IV SOLN: INTRAVENOUS | Status: DC

## 2018-05-24 MED ORDER — ASPIRIN EC 81 MG PO TBEC
81.0000 mg | DELAYED_RELEASE_TABLET | Freq: Every day | ORAL | Status: DC
  Administered 2018-05-25 – 2018-05-27 (×3): 81 mg via ORAL
  Filled 2018-05-24 (×3): qty 1

## 2018-05-24 MED ORDER — ISOSORBIDE MONONITRATE ER 30 MG PO TB24
15.0000 mg | ORAL_TABLET | Freq: Every day | ORAL | Status: DC
  Administered 2018-05-25: 15 mg via ORAL
  Filled 2018-05-24: qty 1

## 2018-05-24 MED ORDER — MONTELUKAST SODIUM 10 MG PO TABS
10.0000 mg | ORAL_TABLET | Freq: Every day | ORAL | Status: DC
  Administered 2018-05-24 – 2018-05-26 (×3): 10 mg via ORAL
  Filled 2018-05-24 (×3): qty 1

## 2018-05-24 NOTE — Telephone Encounter (Signed)
LOV 02/13/18. Canceled appt on 04/22/18. No future appt noted. LVM requesting returned call to schedule an appt

## 2018-05-24 NOTE — ED Provider Notes (Addendum)
[facility] EMERGENCY DEPARTMENT Provider Note   CSN: [id number] Arrival date & time: 05/24/18  1706    History   Chief Complaint Chief Complaint  Patient presents with  . Chest Pain    HPI Jennifer Hogan is a 48 y.o. female.     The history is provided by the patient and medical records. No language interpreter was used.  Chest Pain   Jennifer Hogan is a 48 y.o. female who presents to the Emergency Department complaining of chest pain.  She complains of severe central chest pain and abdominal pain that began this morning.  Pain is described as a constant heaviness, nonradiating.  She has associated N/V.  Denies fevers, sob.  She has mild associated diarrhea.  Similar sxs in the past associated with DKA.  Denies any recent medications changes or illnesses.  Sxs are severe, constant, worsening.   Past Medical History:  Diagnosis Date  . Allergic state 08/07/2015  . Anemia   . Anxiety   . Coronary artery disease    a. s/pPTCA DES x3 in the LAD (ostial DES placed, mid DES placed, distal DES placed)and PTCA/DES x1to intermediate branch 02/2017. b. STEMI 02/08/18 with LCx stent placement.  . Depression   . Diabetes mellitus    type 1, on insulin pump  . DKA (diabetic ketoacidoses) ([place]) 12/31/2017  . Elevated platelet count   . High cholesterol   . Hypothyroidism   . Vomiting and diarrhea 12/31/2017    Patient Active Problem List   Diagnosis Date Noted  . DKA, type 1 ([place]) 05/24/2018  . Moderate episode of recurrent major depressive disorder ([place]) 03/27/2018  . Epigastric pain   . Hyperglycemia due to type 1 diabetes mellitus ([place]) 02/14/2018  . Hyperlipidemia 02/14/2018  . S/P drug eluting coronary stent placement   . ACS (acute coronary syndrome) ([place]) 02/08/2018  . ST elevation myocardial infarction (STEMI) ([place]) 02/08/2018  . DKA (diabetic ketoacidosis) ([place]) 12/31/2017  . Marijuana abuse 12/31/2017  . Gastroparesis 12/06/2017  .  Gastroparesis due to DM ([place]) 03/28/2017  . Coronary artery disease 03/28/2017  . Ischemic cardiomyopathy 03/28/2017  . Pure hypercholesterolemia   . NSTEMI (non-ST elevated myocardial infarction) ([place])   . Anemia 03/17/2017  . Allergic state 08/07/2015  . Vitiligo 07/09/2012  . Type 1 diabetes mellitus ([place]) 07/09/2012  . Depression 12/04/2010  . Non-intractable vomiting with nausea 12/04/2010  . Hypothyroidism 12/04/2010    Past Surgical History:  Procedure Laterality Date  . APPENDECTOMY    . CORONARY STENT INTERVENTION N/A 03/20/2017   Procedure: DES x 3 LAD, DES OM; Surgeon: Burnell Blanks, MD;  Location: [facility];  Service: Cardiovascular;  Laterality: N/A;  . CORONARY/GRAFT ACUTE MI REVASCULARIZATION N/A 02/08/2018   Procedure: Coronary/Graft Acute MI Revascularization;  Surgeon: Belva Crome, MD;  Location: [facility];  Service: Cardiovascular;  Laterality: N/A;  . LEFT HEART CATH AND CORONARY ANGIOGRAPHY N/A 03/20/2017   Procedure: LEFT HEART CATH AND CORONARY ANGIOGRAPHY;  Surgeon: Burnell Blanks, MD;  Location: [facility];  Service: Cardiovascular;  Laterality: N/A;  . LEFT HEART CATH AND CORONARY ANGIOGRAPHY N/A 02/08/2018   Procedure: LEFT HEART CATH AND CORONARY ANGIOGRAPHY;  Surgeon: Belva Crome, MD;  Location: [facility];  Service: Cardiovascular;  Laterality: N/A;     OB History    Gravida  0   Para  0   Term  0   Preterm  0  AB  0   Living  0     SAB  0   TAB  0   Ectopic  0   Multiple  0   Live Births  0            Home Medications    Prior to Admission medications   Medication Sig Start Date End Date Taking? Authorizing Provider  albuterol (PROVENTIL HFA;VENTOLIN HFA) 108 (90 Base) MCG/ACT inhaler Inhale 2 puffs into the lungs every 6 (six) hours as needed for wheezing or shortness of breath. 12/07/17 03/04/18  Damita Lack, MD  aspirin 81 MG EC tablet Take 1 tablet (81 mg  total) by mouth daily. 12/07/17   Damita Lack, MD  clopidogrel (PLAVIX) 75 MG tablet Take 1 tablet (75 mg total) by mouth daily with breakfast. 02/12/18   Kathyrn Drown D, NP  FLUoxetine (PROZAC) 10 MG capsule Take 1 capsule (10 mg total) by mouth daily. 12/07/17 03/11/18  Amin, Jeanella Flattery, MD  FLUoxetine (PROZAC) 40 MG capsule Take 1 capsule (40 mg total) by mouth daily. 12/07/17 03/11/18  Amin, Jeanella Flattery, MD  insulin aspart (NOVOLOG) 100 UNIT/ML injection 35 units per insulin pump daily Patient taking differently: Inject 35 Units into the skin daily. insulin pump daily 10/31/17   Renato Shin, MD  ipratropium (ATROVENT) 0.06 % nasal spray Place 2 sprays into both nostrils 4 (four) times daily. 02/27/18   Tasia Catchings, Amy V, PA-C  isosorbide mononitrate (IMDUR) 30 MG 24 hr tablet Take 0.5 tablets (15 mg total) by mouth daily. 02/11/18   Kathyrn Drown D, NP  levothyroxine (SYNTHROID) 100 MCG tablet Take 1 tablet (100 mcg total) by mouth daily before breakfast. 12/07/17 04/24/18  Amin, Jeanella Flattery, MD  metoprolol tartrate (LOPRESSOR) 25 MG tablet Take 1 tablet (25 mg total) by mouth 2 (two) times daily. Hold for SBP<90 and HR<50 02/11/18   Kathyrn Drown D, NP  montelukast (SINGULAIR) 10 MG tablet Take 1 tablet (10 mg total) by mouth at bedtime. 05/20/18 08/18/18  [name], NP  nitroGLYCERIN (NITROSTAT) 0.4 MG SL tablet Place 1 tablet (0.4 mg total) under the tongue every 5 (five) minutes as needed for chest pain. 12/07/17   Amin, Jeanella Flattery, MD  ondansetron (ZOFRAN) 4 MG tablet Take 1 tablet (4 mg total) by mouth every 8 (eight) hours as needed for nausea or vomiting. 01/02/18   Ghimire, Henreitta Leber, MD  pantoprazole (PROTONIX) 40 MG tablet Take 1 tablet (40 mg total) by mouth 2 (two) times daily. 03/27/18 05/01/18  [name], NP  rosuvastatin (CRESTOR) 40 MG tablet Take 1 tablet (40 mg total) by mouth daily at 6 PM. 02/11/18   Tommie Raymond, NP    Family History Family History  Problem  Relation Age of Onset  . Coronary artery disease Father   . Congestive Heart Failure Father   . Asthma Father   . Cancer Mother        T cell lymphoma    Social History Social History   Tobacco Use  . Smoking status: Never Smoker  . Smokeless tobacco: Never Used  . Tobacco comment: Use marijuana  Substance Use Topics  . Alcohol use: No    Alcohol/week: 0.0 standard drinks  . Drug use: Yes    Types: Marijuana    Comment: last use last week, occaisonal marijuana use     Allergies   Ciprofloxin hcl [ciprofloxacin] and Food color red [red dye]   Review of Systems Review  of Systems  Cardiovascular: Positive for chest pain.  All other systems reviewed and are negative.    Physical Exam Updated Vital Signs BP (!) 155/95   Pulse 99   Temp 98.4 F (36.9 C) (Oral)   Resp 14   Ht 5\' 5"  (1.651 m)   Wt 67.9 kg   LMP 05/24/2018   SpO2 100%   BMI 24.91 kg/m   Physical Exam Vitals signs and nursing note reviewed.  Constitutional:      General: She is in acute distress.     Appearance: She is well-developed. She is ill-appearing.  HENT:     Head: Normocephalic and atraumatic.  Cardiovascular:     Rate and Rhythm: Normal rate and regular rhythm.     Heart sounds: No murmur.  Pulmonary:     Effort: Pulmonary effort is normal. No respiratory distress.     Breath sounds: Normal breath sounds.  Abdominal:     Palpations: Abdomen is soft.     Tenderness: There is no guarding or rebound.     Comments: Mild generalized abdominal tenderness  Musculoskeletal:        General: No swelling or tenderness.  Skin:    General: Skin is warm and dry.     Coloration: Skin is pale.  Neurological:     Mental Status: She is alert and oriented to person, place, and time.  Psychiatric:        Behavior: Behavior normal.      ED Treatments / Results  Labs (all labs ordered are listed, but only abnormal results are displayed) Labs Reviewed  BASIC METABOLIC PANEL - Abnormal;  Notable for the following components:      Result Value   CO2 19 (*)    Glucose, Bld 349 (*)    Anion gap 17 (*)    All other components within normal limits  CBC - Abnormal; Notable for the following components:   WBC 12.0 (*)    Platelets 484 (*)    All other components within normal limits  TROPONIN I - Abnormal; Notable for the following components:   Troponin I 0.07 (*)    All other components within normal limits  HEPATIC FUNCTION PANEL - Abnormal; Notable for the following components:   Total Protein 8.9 (*)    All other components within normal limits  URINALYSIS, ROUTINE W REFLEX MICROSCOPIC - Abnormal; Notable for the following components:   Color, Urine STRAW (*)    Glucose, UA >=500 (*)    Hgb urine dipstick LARGE (*)    Ketones, ur 20 (*)    RBC / HPF >50 (*)    All other components within normal limits  BETA-HYDROXYBUTYRIC ACID - Abnormal; Notable for the following components:   Beta-Hydroxybutyric Acid 0.88 (*)    All other components within normal limits  TROPONIN I - Abnormal; Notable for the following components:   Troponin I 0.10 (*)    All other components within normal limits  GLUCOSE, CAPILLARY - Abnormal; Notable for the following components:   Glucose-Capillary 189 (*)    All other components within normal limits  POCT I-STAT EG7 - Abnormal; Notable for the following components:   pH, Ven 7.457 (*)    pCO2, Ven 29.0 (*)    pO2, Ven 85.0 (*)    TCO2 21 (*)    Calcium, Ion 1.09 (*)    All other components within normal limits  CBG MONITORING, ED - Abnormal; Notable for the following components:   Glucose-Capillary  261 (*)    All other components within normal limits  URINE CULTURE  MRSA PCR SCREENING  LIPASE, BLOOD  TROPONIN I  TROPONIN I  HIV ANTIBODY (ROUTINE TESTING W REFLEX)  BASIC METABOLIC PANEL  BASIC METABOLIC PANEL  BASIC METABOLIC PANEL  BASIC METABOLIC PANEL  I-STAT BETA HCG BLOOD, ED ([facility], [facility], [facility] ONLY)  I-STAT VENOUS BLOOD GAS, ED     EKG EKG Interpretation  Date/Time:  Friday May 24 2018 17:02:10 EDT Ventricular Rate:  82 PR Interval:  140 QRS Duration: 92 QT Interval:  444 QTC Calculation: 518 R Axis:   86 Text Interpretation:  Normal sinus rhythm Nonspecific ST abnormality Prolonged QT Abnormal ECG Confirmed by Quintella Reichert [phone]) on 05/24/2018 6:56:40 PM   Radiology Dg Chest 2 View  Result Date: 05/24/2018 CLINICAL DATA:  Chest pain EXAM: CHEST - 2 VIEW COMPARISON:  02/14/2018 FINDINGS: The heart size and mediastinal contours are within normal limits. Both lungs are clear. The visualized skeletal structures are unremarkable. IMPRESSION: No active cardiopulmonary disease. Electronically Signed   By: Ulyses Jarred M.D.   On: 05/24/2018 17:58    Procedures Procedures (including critical care time) CRITICAL CARE Performed by: Quintella Reichert   Total critical care time: 35 minutes  Critical care time was exclusive of separately billable procedures and treating other patients.  Critical care was necessary to treat or prevent imminent or life-threatening deterioration.  Critical care was time spent personally by me on the following activities: development of treatment plan with patient and/or surrogate as well as nursing, discussions with consultants, evaluation of patient's response to treatment, examination of patient, obtaining history from patient or surrogate, ordering and performing treatments and interventions, ordering and review of laboratory studies, ordering and review of radiographic studies, pulse oximetry and re-evaluation of patient's condition.  Medications Ordered in ED Medications  insulin regular, human (MYXREDLIN) 100 units/ 100 mL infusion (3.5 Units/hr Intravenous Rate/Dose Change 05/24/18 2254)  potassium chloride 10 mEq in 100 mL IVPB (10 mEq Intravenous New Bag/Given 05/24/18 2227)  nitroGLYCERIN (NITROSTAT) SL tablet 0.4 mg (has no administration in time range)  0.9 %  sodium chloride  infusion ( Intravenous Hold 05/24/18 2200)  dextrose 5 %-0.45 % sodium chloride infusion ( Intravenous New Bag/Given 05/24/18 2156)  enoxaparin (LOVENOX) injection 40 mg (40 mg Subcutaneous Given 05/24/18 2159)  albuterol (PROVENTIL) (2.5 MG/3ML) 0.083% nebulizer solution 2.5 mg (has no administration in time range)  aspirin EC tablet 81 mg (has no administration in time range)  clopidogrel (PLAVIX) tablet 75 mg (has no administration in time range)  ipratropium (ATROVENT) 0.06 % nasal spray 2 spray (has no administration in time range)  isosorbide mononitrate (IMDUR) 24 hr tablet 15 mg (has no administration in time range)  metoprolol tartrate (LOPRESSOR) tablet 25 mg (25 mg Oral Given 05/24/18 2256)  levothyroxine (SYNTHROID) tablet 100 mcg (has no administration in time range)  montelukast (SINGULAIR) tablet 10 mg (10 mg Oral Given 05/24/18 2250)  ondansetron (ZOFRAN) tablet 4 mg (has no administration in time range)  rosuvastatin (CRESTOR) tablet 40 mg (has no administration in time range)  pantoprazole (PROTONIX) EC tablet 40 mg (40 mg Oral Given 05/24/18 2250)  promethazine (PHENERGAN) tablet 12.5-25 mg (has no administration in time range)    Or  promethazine (PHENERGAN) injection 12.5-25 mg (has no administration in time range)    Or  promethazine (PHENERGAN) suppository 12.5-25 mg (has no administration in time range)  sodium chloride 0.9 % bolus 1,000 mL (0 mLs Intravenous Stopped  05/24/18 1935)  fentaNYL (SUBLIMAZE) injection 50 mcg (50 mcg Intravenous Given 05/24/18 1803)  LORazepam (ATIVAN) injection 0.5 mg (0.5 mg Intravenous Given 05/24/18 1804)  sodium chloride 0.9 % bolus 1,000 mL (1,000 mLs Intravenous New Bag/Given 05/24/18 2028)  fentaNYL (SUBLIMAZE) injection 50 mcg (50 mcg Intravenous Given 05/24/18 2159)  ondansetron (ZOFRAN) injection 4 mg (4 mg Intravenous Given 05/24/18 2159)  aspirin chewable tablet 324 mg (324 mg Oral Given 05/24/18 2159)     Initial Impression / Assessment and Plan /  ED Course  I have reviewed the triage vital signs and the nursing notes.  Pertinent labs & imaging results that were available during my care of the patient were reviewed by me and considered in my medical decision making (see chart for details).        Pt w/ hx/o CAD, DM here with AP/CP/V/D.  She is ill appearing on examination.  EKG without acute ischemic changes.  She was treated with IVF, pain meds, anti-emetics.  Concern for developing DKA - will treat with glucostabilizer. Troponin is mildly elevated, decreased when compared to priors.  Patient updated of findings of studies and recommendation for admission.  Hospitalist consulted for admission for further treatment.    Final Clinical Impressions(s) / ED Diagnoses   Final diagnoses:  Diabetic ketoacidosis without coma associated with type 1 diabetes mellitus ([place])  Nausea vomiting and diarrhea    ED Discharge Orders    None       Quintella Reichert, MD 05/24/18 2329    Quintella Reichert, MD 06/12/18 0830

## 2018-05-24 NOTE — ED Notes (Signed)
Attempted to obtain urine specimen; Pt unable to provide one at this time 

## 2018-05-24 NOTE — H&P (Signed)
History and Physical    Jennifer Hogan YIF:[id number] DOB: [birth year] DOA: 05/24/2018  PCP: [name], NP  Patient coming from: Home  I have personally briefly reviewed patient's old medical records in [place]  Chief Complaint: CP  HPI: Jennifer Hogan is a 48 y.o. female with medical history significant of DM1, CAD s/p numerous stents, last LHC and stent placement was in Jan this year for STEMI, numerous lesions / diffuse disease though.  Patient has been having intermittent CP for past week.  This morning became constant, severe, central.  Associated N/V, mild diarrhea, no fevers, no SOB.  Similar symptoms in past associated with DKA she says.  Took NTG at home, refused further NTG with EMS, ASA given.   ED Course: Trop 0.07, similar to priors in Feb.  AG 17.  BGL 300s.  Started on [employer].   Review of Systems: As per HPI otherwise 10 point review of systems negative.   Past Medical History:  Diagnosis Date  . Allergic state 08/07/2015  . Anemia   . Anxiety   . Coronary artery disease    a. s/pPTCA DES x3 in the LAD (ostial DES placed, mid DES placed, distal DES placed)and PTCA/DES x1to intermediate branch 02/2017. b. STEMI 02/08/18 with LCx stent placement.  . Depression   . Diabetes mellitus    type 1, on insulin pump  . DKA (diabetic ketoacidoses) ([place]) 12/31/2017  . Elevated platelet count   . High cholesterol   . Hypothyroidism   . Vomiting and diarrhea 12/31/2017    Past Surgical History:  Procedure Laterality Date  . APPENDECTOMY    . CORONARY STENT INTERVENTION N/A 03/20/2017   Procedure: DES x 3 LAD, DES OM; Surgeon: Burnell Blanks, MD;  Location: [facility];  Service: Cardiovascular;  Laterality: N/A;  . CORONARY/GRAFT ACUTE MI REVASCULARIZATION N/A 02/08/2018   Procedure: Coronary/Graft Acute MI Revascularization;  Surgeon: Belva Crome, MD;  Location: [facility];  Service: Cardiovascular;  Laterality:  N/A;  . LEFT HEART CATH AND CORONARY ANGIOGRAPHY N/A 03/20/2017   Procedure: LEFT HEART CATH AND CORONARY ANGIOGRAPHY;  Surgeon: Burnell Blanks, MD;  Location: [facility];  Service: Cardiovascular;  Laterality: N/A;  . LEFT HEART CATH AND CORONARY ANGIOGRAPHY N/A 02/08/2018   Procedure: LEFT HEART CATH AND CORONARY ANGIOGRAPHY;  Surgeon: Belva Crome, MD;  Location: [facility];  Service: Cardiovascular;  Laterality: N/A;     reports that she has never smoked. She has never used smokeless tobacco. She reports current drug use. Drug: Marijuana. She reports that she does not drink alcohol.  Allergies  Allergen Reactions  . Ciprofloxin Hcl [Ciprofloxacin] Nausea And Vomiting and Other (See Comments)    Severe migraine  . Food Color Red [Red Dye] Diarrhea    Family History  Problem Relation Age of Onset  . Coronary artery disease Father   . Congestive Heart Failure Father   . Asthma Father   . Cancer Mother        T cell lymphoma     Prior to Admission medications   Medication Sig Start Date End Date Taking? Authorizing Provider  albuterol (PROVENTIL HFA;VENTOLIN HFA) 108 (90 Base) MCG/ACT inhaler Inhale 2 puffs into the lungs every 6 (six) hours as needed for wheezing or shortness of breath. 12/07/17 03/04/18  Damita Lack, MD  aspirin 81 MG EC tablet Take 1 tablet (81 mg total) by mouth daily. 12/07/17   Amin,  Ankit Chirag, MD  clopidogrel (PLAVIX) 75 MG tablet Take 1 tablet (75 mg total) by mouth daily with breakfast. 02/12/18   Kathyrn Drown D, NP  FLUoxetine (PROZAC) 10 MG capsule Take 1 capsule (10 mg total) by mouth daily. 12/07/17 03/11/18  Amin, Jeanella Flattery, MD  FLUoxetine (PROZAC) 40 MG capsule Take 1 capsule (40 mg total) by mouth daily. 12/07/17 03/11/18  Amin, Jeanella Flattery, MD  insulin aspart (NOVOLOG) 100 UNIT/ML injection 35 units per insulin pump daily Patient taking differently: Inject 35 Units into the skin daily. insulin pump daily 10/31/17    Renato Shin, MD  ipratropium (ATROVENT) 0.06 % nasal spray Place 2 sprays into both nostrils 4 (four) times daily. 02/27/18   Tasia Catchings, Amy V, PA-C  isosorbide mononitrate (IMDUR) 30 MG 24 hr tablet Take 0.5 tablets (15 mg total) by mouth daily. 02/11/18   Kathyrn Drown D, NP  levothyroxine (SYNTHROID) 100 MCG tablet Take 1 tablet (100 mcg total) by mouth daily before breakfast. 12/07/17 04/24/18  Amin, Jeanella Flattery, MD  metoprolol tartrate (LOPRESSOR) 25 MG tablet Take 1 tablet (25 mg total) by mouth 2 (two) times daily. Hold for SBP<90 and HR<50 02/11/18   Kathyrn Drown D, NP  montelukast (SINGULAIR) 10 MG tablet Take 1 tablet (10 mg total) by mouth at bedtime. 05/20/18 08/18/18  [name], NP  nitroGLYCERIN (NITROSTAT) 0.4 MG SL tablet Place 1 tablet (0.4 mg total) under the tongue every 5 (five) minutes as needed for chest pain. 12/07/17   Amin, Jeanella Flattery, MD  ondansetron (ZOFRAN) 4 MG tablet Take 1 tablet (4 mg total) by mouth every 8 (eight) hours as needed for nausea or vomiting. 01/02/18   Ghimire, Henreitta Leber, MD  pantoprazole (PROTONIX) 40 MG tablet Take 1 tablet (40 mg total) by mouth 2 (two) times daily. 03/27/18 05/01/18  [name], NP  rosuvastatin (CRESTOR) 40 MG tablet Take 1 tablet (40 mg total) by mouth daily at 6 PM. 02/11/18   Tommie Raymond, NP    Physical Exam: Vitals:   05/24/18 1716 05/24/18 1815 05/24/18 1830 05/24/18 1915  BP: (!) 153/91 (!) 166/86 (!) 166/91 (!) 165/85  Pulse: 89 82 89 91  Resp: 20 13 15 12   Temp: 98.1 F (36.7 C)     TempSrc: Oral     SpO2: 100% 100% 100% 98%  Weight:      Height:        Constitutional: NAD, calm, comfortable Eyes: PERRL, lids and conjunctivae normal ENMT: Mucous membranes are moist. Posterior pharynx clear of any exudate or lesions.Normal dentition.  Neck: normal, supple, no masses, no thyromegaly Respiratory: clear to auscultation bilaterally, no wheezing, no crackles. Normal respiratory effort. No accessory muscle use.   Cardiovascular: Regular rate and rhythm, no murmurs / rubs / gallops. No extremity edema. 2+ pedal pulses. No carotid bruits.  Abdomen: no tenderness, no masses palpated. No hepatosplenomegaly. Bowel sounds positive.  Musculoskeletal: no clubbing / cyanosis. No joint deformity upper and lower extremities. Good ROM, no contractures. Normal muscle tone.  Skin: no rashes, lesions, ulcers. No induration Neurologic: CN 2-12 grossly intact. Sensation intact, DTR normal. Strength 5/5 in all 4.  Psychiatric: Normal judgment and insight. Alert and oriented x 3. Normal mood.    Labs on Admission: I have personally reviewed following labs and imaging studies  CBC: Recent Labs  Lab 05/24/18 1715 05/24/18 1845  WBC 12.0*  --   HGB 12.5 13.3  HCT 38.1 39.0  MCV 88.4  --  PLT 484*  --    Basic Metabolic Panel: Recent Labs  Lab 05/24/18 1715 05/24/18 1845  NA 136 137  K 3.9 3.6  CL 100  --   CO2 19*  --   GLUCOSE 349*  --   BUN 10  --   CREATININE 1.00  --   CALCIUM 9.8  --    GFR: Estimated Creatinine Clearance: 66.9 mL/min (by C-G formula based on SCr of 1 mg/dL). Liver Function Tests: Recent Labs  Lab 05/24/18 1803  AST 40  ALT 24  ALKPHOS 109  BILITOT 1.0  PROT 8.9*  ALBUMIN 4.4   Recent Labs  Lab 05/24/18 1803  LIPASE 21   No results for input(s): AMMONIA in the last 168 hours. Coagulation Profile: No results for input(s): INR, PROTIME in the last 168 hours. Cardiac Enzymes: Recent Labs  Lab 05/24/18 1715  TROPONINI 0.07*   BNP (last 3 results) No results for input(s): PROBNP in the last 8760 hours. HbA1C: No results for input(s): HGBA1C in the last 72 hours. CBG: Recent Labs  Lab 05/24/18 1938  GLUCAP 261*   Lipid Profile: No results for input(s): CHOL, HDL, LDLCALC, TRIG, CHOLHDL, LDLDIRECT in the last 72 hours. Thyroid Function Tests: No results for input(s): TSH, T4TOTAL, FREET4, T3FREE, THYROIDAB in the last 72 hours. Anemia Panel: No results  for input(s): VITAMINB12, FOLATE, FERRITIN, TIBC, IRON, RETICCTPCT in the last 72 hours. Urine analysis:    Component Value Date/Time   COLORURINE COLORLESS (A) 02/14/2018 1704   APPEARANCEUR CLEAR 02/14/2018 1704   LABSPEC 1.010 02/14/2018 1704   PHURINE 7.0 02/14/2018 1704   GLUCOSEU >=500 (A) 02/14/2018 1704   GLUCOSEU NEGATIVE 11/23/2015 0831   HGBUR NEGATIVE 02/14/2018 1704   BILIRUBINUR NEGATIVE 02/14/2018 1704   BILIRUBINUR neg 12/07/2015 0843   KETONESUR 20 (A) 02/15/2018 0400   PROTEINUR NEGATIVE 02/14/2018 1704   UROBILINOGEN 1.0 12/07/2015 0843   UROBILINOGEN 0.2 11/23/2015 0831   NITRITE NEGATIVE 02/14/2018 1704   LEUKOCYTESUR NEGATIVE 02/14/2018 1704    Radiological Exams on Admission: Dg Chest 2 View  Result Date: 05/24/2018 CLINICAL DATA:  Chest pain EXAM: CHEST - 2 VIEW COMPARISON:  02/14/2018 FINDINGS: The heart size and mediastinal contours are within normal limits. Both lungs are clear. The visualized skeletal structures are unremarkable. IMPRESSION: No active cardiopulmonary disease. Electronically Signed   By: Ulyses Jarred M.D.   On: 05/24/2018 17:58    EKG: Independently reviewed.  Assessment/Plan Principal Problem:   DKA, type 1 (HCC) Active Problems:   Coronary artery disease   Hyperglycemia due to type 1 diabetes mellitus ([place])    1. DKA vs hyperglycemia due to DM1 - 1. DKA pathway 2. IV insulin via glucostabilizer 3. BHB pending 4. BMP Q4H 5. 2 runs IV K 2. CAD - 1. Suspected stable angina vs symptoms due to #1 above vs gastroparesis 2. Spoke with Dr. Marlou Porch: 1. Serial trops, suspects mild trop elevation may be a chronic baseline 2. Unless trop elevates, nothing to do (no repeat cath nor stress test at this time) 3. No heparin gtt at this point 4. Consider increasing home Imdur 3. Continue ASA plavix 4. Continue Imdur and consider increasing 5. Ordering SL NTG if patient will take it 6. Continue Statin 7. Continue metoprolol 8. Zofran  PRN nausea  DVT prophylaxis: Lovenox Code Status: Full Family Communication: No family in room Disposition Plan: Home after admit Consults called: Spoke with Dr. Marlou Porch over phone, not formal consult needed at this point, call  back if trop elevates further. Admission status: Place in obs: convert to IP if BHB positive   GARDNER, JARED M. DO [facility]  How to contact the [facility] Attending or Consulting provider [place] or covering provider during after hours [place], for this patient?  1. Check the care team in [facility] and look for a) attending/consulting [facility] provider listed and b) the [facility] team listed 2. Log into [email or web address]  Amion Physician Scheduling and messaging for groups and whole hospitals  On call and physician scheduling software for group practices, residents, hospitalists and other medical providers for call, clinic, rotation and shift schedules. On[employer] is a hospital-wide system for scheduling doctors and paging doctors on call. EasyPlot is for scientific plotting and data analysis.  [email or web address]  and use [facility]'s universal password to access. If you do not have the password, please contact the hospital operator.  3. Locate the [facility] provider you are looking for under [facility] and page to a number that you can be directly reached. 4. If you still have difficulty reaching the provider, please page the [facility] (Director on Call) for the Hospitalists listed on amion for assistance.  05/24/2018, 7:45 PM

## 2018-05-24 NOTE — ED Triage Notes (Signed)
Pt here for evaluation of chest pain intermittent x 1 week but worsening today. Pain is L sided, radiating to shoulder blades. Hx 5 stents. Endorses n/v/d. Has insulin pump, CBG 400 this morning, so she took more insulin in addition to her insulin pump. Pt refused ASA and nitro in route. Has taken nitro at home this week without relief.

## 2018-05-24 NOTE — ED Notes (Signed)
Patient transported to X-ray 

## 2018-05-24 NOTE — ED Notes (Signed)
Dr. Ralene Bathe aware of elevated troponin

## 2018-05-25 ENCOUNTER — Encounter (HOSPITAL_COMMUNITY): Payer: Self-pay

## 2018-05-25 DIAGNOSIS — E039 Hypothyroidism, unspecified: Secondary | ICD-10-CM

## 2018-05-25 DIAGNOSIS — I2510 Atherosclerotic heart disease of native coronary artery without angina pectoris: Secondary | ICD-10-CM

## 2018-05-25 DIAGNOSIS — E785 Hyperlipidemia, unspecified: Secondary | ICD-10-CM

## 2018-05-25 LAB — BASIC METABOLIC PANEL
Anion gap: 18 — ABNORMAL HIGH (ref 5–15)
Anion gap: 12 (ref 5–15)
Anion gap: 12 (ref 5–15)
Anion gap: 18 — ABNORMAL HIGH (ref 5–15)
Anion gap: 16 — ABNORMAL HIGH (ref 5–15)
BUN: 11 mg/dL (ref 6–20)
BUN: 10 mg/dL (ref 6–20)
BUN: 13 mg/dL (ref 6–20)
BUN: 15 mg/dL (ref 6–20)
BUN: 16 mg/dL (ref 6–20)
CO2: 19 mmol/L — ABNORMAL LOW (ref 22–32)
CO2: 22 mmol/L (ref 22–32)
CO2: 24 mmol/L (ref 22–32)
CO2: 18 mmol/L — ABNORMAL LOW (ref 22–32)
CO2: 17 mmol/L — ABNORMAL LOW (ref 22–32)
Calcium: 9.4 mg/dL (ref 8.9–10.3)
Calcium: 9.4 mg/dL (ref 8.9–10.3)
Calcium: 9.5 mg/dL (ref 8.9–10.3)
Calcium: 9.5 mg/dL (ref 8.9–10.3)
Calcium: 9 mg/dL (ref 8.9–10.3)
Chloride: 100 mmol/L (ref 98–111)
Chloride: 104 mmol/L (ref 98–111)
Chloride: 103 mmol/L (ref 98–111)
Chloride: 100 mmol/L (ref 98–111)
Chloride: 101 mmol/L (ref 98–111)
Creatinine, Ser: 0.92 mg/dL (ref 0.44–1.00)
Creatinine, Ser: 0.82 mg/dL (ref 0.44–1.00)
Creatinine, Ser: 0.95 mg/dL (ref 0.44–1.00)
Creatinine, Ser: 1.18 mg/dL — ABNORMAL HIGH (ref 0.44–1.00)
Creatinine, Ser: 1.26 mg/dL — ABNORMAL HIGH (ref 0.44–1.00)
GFR calc Af Amer: >60 mL/min (ref >60)
GFR calc Af Amer: >60 mL/min (ref >60)
GFR calc Af Amer: >60 mL/min (ref >60)
GFR calc Af Amer: >60 mL/min (ref >60)
GFR calc Af Amer: 58 mL/min — ABNORMAL LOW (ref >60)
GFR calc non Af Amer: >60 mL/min (ref >60)
GFR calc non Af Amer: >60 mL/min (ref >60)
GFR calc non Af Amer: >60 mL/min (ref >60)
GFR calc non Af Amer: 54 mL/min — ABNORMAL LOW (ref >60)
GFR calc non Af Amer: 50 mL/min — ABNORMAL LOW (ref >60)
Glucose, Bld: 248 mg/dL — ABNORMAL HIGH (ref 70–99)
Glucose, Bld: 117 mg/dL — ABNORMAL HIGH (ref 70–99)
Glucose, Bld: 142 mg/dL — ABNORMAL HIGH (ref 70–99)
Glucose, Bld: 382 mg/dL — ABNORMAL HIGH (ref 70–99)
Glucose, Bld: 317 mg/dL — ABNORMAL HIGH (ref 70–99)
Potassium: 4.6 mmol/L (ref 3.5–5.1)
Potassium: 4.1 mmol/L (ref 3.5–5.1)
Potassium: 3.6 mmol/L (ref 3.5–5.1)
Potassium: 4.8 mmol/L (ref 3.5–5.1)
Potassium: 4 mmol/L (ref 3.5–5.1)
Sodium: 137 mmol/L (ref 135–145)
Sodium: 138 mmol/L (ref 135–145)
Sodium: 139 mmol/L (ref 135–145)
Sodium: 136 mmol/L (ref 135–145)
Sodium: 134 mmol/L — ABNORMAL LOW (ref 135–145)

## 2018-05-25 LAB — GLUCOSE, CAPILLARY
Glucose-Capillary: 121 mg/dL — ABNORMAL HIGH (ref 70–99)
Glucose-Capillary: 132 mg/dL — ABNORMAL HIGH (ref 70–99)
Glucose-Capillary: 133 mg/dL — ABNORMAL HIGH (ref 70–99)
Glucose-Capillary: 149 mg/dL — ABNORMAL HIGH (ref 70–99)
Glucose-Capillary: 162 mg/dL — ABNORMAL HIGH (ref 70–99)
Glucose-Capillary: 196 mg/dL — ABNORMAL HIGH (ref 70–99)
Glucose-Capillary: 207 mg/dL — ABNORMAL HIGH (ref 70–99)
Glucose-Capillary: 214 mg/dL — ABNORMAL HIGH (ref 70–99)
Glucose-Capillary: 224 mg/dL — ABNORMAL HIGH (ref 70–99)
Glucose-Capillary: 236 mg/dL — ABNORMAL HIGH (ref 70–99)
Glucose-Capillary: 298 mg/dL — ABNORMAL HIGH (ref 70–99)
Glucose-Capillary: 334 mg/dL — ABNORMAL HIGH (ref 70–99)
Glucose-Capillary: 358 mg/dL — ABNORMAL HIGH (ref 70–99)

## 2018-05-25 LAB — HIV ANTIBODY (ROUTINE TESTING W REFLEX): HIV Screen 4th Generation wRfx: NONREACTIVE

## 2018-05-25 LAB — TROPONIN I
Troponin I: 0.19 ng/mL (ref <0.03)
Troponin I: 0.24 ng/mL (ref <0.03)
Troponin I: 0.22 ng/mL (ref <0.03)

## 2018-05-25 LAB — MRSA PCR SCREENING: MRSA by PCR: NEGATIVE

## 2018-05-25 MED ORDER — SODIUM CHLORIDE 0.9 % IV SOLN
INTRAVENOUS | Status: DC
  Administered 2018-05-25 – 2018-05-26 (×3): via INTRAVENOUS

## 2018-05-25 MED ORDER — ALUM & MAG HYDROXIDE-SIMETH 200-200-20 MG/5ML PO SUSP
30.0000 mL | Freq: Four times a day (QID) | ORAL | Status: DC | PRN
  Administered 2018-05-25 (×2): 30 mL via ORAL
  Filled 2018-05-25 (×2): qty 30

## 2018-05-25 MED ORDER — ISOSORBIDE MONONITRATE ER 60 MG PO TB24
60.0000 mg | ORAL_TABLET | Freq: Every day | ORAL | Status: DC
  Administered 2018-05-26 – 2018-05-27 (×2): 60 mg via ORAL
  Filled 2018-05-25 (×2): qty 1

## 2018-05-25 MED ORDER — TRAMADOL HCL 50 MG PO TABS
25.0000 mg | ORAL_TABLET | Freq: Four times a day (QID) | ORAL | Status: DC | PRN
  Administered 2018-05-25 – 2018-05-26 (×4): 25 mg via ORAL
  Filled 2018-05-25 (×4): qty 1

## 2018-05-25 MED ORDER — ONDANSETRON HCL 4 MG/2ML IJ SOLN
4.0000 mg | Freq: Once | INTRAMUSCULAR | Status: AC
  Administered 2018-05-25: 4 mg via INTRAVENOUS
  Filled 2018-05-25: qty 2

## 2018-05-25 MED ORDER — METOPROLOL TARTRATE 50 MG PO TABS
50.0000 mg | ORAL_TABLET | Freq: Two times a day (BID) | ORAL | Status: DC
  Administered 2018-05-25 – 2018-05-27 (×3): 50 mg via ORAL
  Filled 2018-05-25 (×4): qty 1

## 2018-05-25 MED ORDER — INSULIN PUMP
Freq: Three times a day (TID) | SUBCUTANEOUS | Status: DC
  Administered 2018-05-25 (×2): via SUBCUTANEOUS
  Administered 2018-05-25: 5.2 via SUBCUTANEOUS
  Administered 2018-05-25: 2.5 via SUBCUTANEOUS
  Administered 2018-05-26 (×2): via SUBCUTANEOUS
  Filled 2018-05-25: qty 1

## 2018-05-25 NOTE — Progress Notes (Signed)
Provider paged with latest BMP results

## 2018-05-25 NOTE — Consult Note (Signed)
Cardiology Consultation:   Patient ID: [name] MRN: [id number]; DOB: [birth year]  Admit date: 05/24/2018 Date of Consult: 05/25/2018  Primary Care Provider: [name], NP Primary Cardiologist: Fransico Him, MD   TELECONSULT  [facility] current COVID pandemic, televisit performed to minimize patient / staff contact.   Case reviewed  Patient's history discussed / patient interviewed over telephone   She will be seen in person in AM     Patient Profile:   Jennifer Hogan is a 48 y.o. female with a hx of CAD  who is being seen today for the evaluation of CP at the request of Dr Cruzita Lederer  History of Present Illness:   [name] is a 48 yo with hx of CAD   The pt presented with  NSTEMI in Feb 2019 Underwent PTCA/DES x3 to LAD (ostial , mid, distal)  And PTCA/DES to ostial Ramus.  Pt had severel stenosis to apical LAD  Too distal for intervention .  Pt admitted on 02/08/18 with STEMI   She complained of aching in L breast. Cath on that admit showed: LM patent; LAD  75% mid (between 2 stents); D1 70% ostial (new), 50%mid instent; 80% distal; LCx (small) 95% ostial, 50% mid; RCA patent.  Pt underwent PTCA/DES to prox/osital LCx (Onyx stent).   Echo LVEF 50 to 55%    Pt presented on 02/15/18 to ED with abdoiminal pain  N/V Trop up 0.1     The pt says over the past few weeks she has had on and off aching under her left breast    Occur with and without activity   She has not taking NTG  She says she usually does take NTG and the symptoms ease away   BUt, at least the past week she has been under increased stress, out of if since her father died earlier this week      Yesterday she came to ED    Symptoms associated with N/V and mild diarrhea  Today the patient says that  she felt like her chest (substernal and R parasternal) was burning, "like on fire"   She has not had before.  New   It is gone now and she feels fine  Taking nap    . Past Medical History:  Diagnosis Date  .  Allergic state 08/07/2015  . Anemia   . Anxiety   . Coronary artery disease    a. s/pPTCA DES x3 in the LAD (ostial DES placed, mid DES placed, distal DES placed)and PTCA/DES x1to intermediate branch 02/2017. b. STEMI 02/08/18 with LCx stent placement.  . Depression   . Diabetes mellitus    type 1, on insulin pump  . DKA (diabetic ketoacidoses) ([place]) 12/31/2017  . Elevated platelet count   . High cholesterol   . Hypothyroidism   . Vomiting and diarrhea 12/31/2017    Past Surgical History:  Procedure Laterality Date  . APPENDECTOMY    . CORONARY STENT INTERVENTION N/A 03/20/2017   Procedure: DES x 3 LAD, DES OM; Surgeon: Burnell Blanks, MD;  Location: [facility];  Service: Cardiovascular;  Laterality: N/A;  . CORONARY/GRAFT ACUTE MI REVASCULARIZATION N/A 02/08/2018   Procedure: Coronary/Graft Acute MI Revascularization;  Surgeon: Belva Crome, MD;  Location: [facility];  Service: Cardiovascular;  Laterality: N/A;  . LEFT HEART CATH AND CORONARY ANGIOGRAPHY N/A 03/20/2017   Procedure: LEFT HEART CATH AND CORONARY ANGIOGRAPHY;  Surgeon: Burnell Blanks, MD;  Location: [facility];  Service: Cardiovascular;  Laterality: N/A;  . LEFT HEART CATH AND CORONARY ANGIOGRAPHY N/A 02/08/2018   Procedure: LEFT HEART CATH AND CORONARY ANGIOGRAPHY;  Surgeon: Belva Crome, MD;  Location: [facility];  Service: Cardiovascular;  Laterality: N/A;       Inpatient Medications: Scheduled Meds: . aspirin EC  81 mg Oral Daily  . clopidogrel  75 mg Oral Q breakfast  . enoxaparin (LOVENOX) injection  40 mg Subcutaneous Q24H  . insulin pump   Subcutaneous TID AC, HS, 0200  . ipratropium  2 spray Each Nare QID  . isosorbide mononitrate  15 mg Oral Daily  . levothyroxine  100 mcg Oral QAC breakfast  . metoprolol tartrate  25 mg Oral BID  . montelukast  10 mg Oral QHS  . pantoprazole  40 mg Oral BID  . rosuvastatin  40 mg Oral q1800   Continuous Infusions:  . sodium chloride 75 mL/hr at 05/25/18 0924   PRN Meds:  Allergies:    Allergies  Allergen Reactions  . Ciprofloxin Hcl [Ciprofloxacin] Nausea And Vomiting and Other (See Comments)    Severe migraine  . Food Color Red [Red Dye] Diarrhea    Social History:   Social History   Socioeconomic History  . Marital status: Divorced    Spouse name: Not on file  . Number of children: Not on file  . Years of education: Not on file  . Highest education level: Not on file  Occupational History  . 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 Financial resource strain: Somewhat hard  . Food insecurity:    Worry: Never true    Inability: Never true  . Transportation needs:    Medical: No    Non-medical: No  Tobacco Use  . Smoking status: Never Smoker  . Smokeless tobacco: Never Used  . Tobacco comment: Use marijuana  Substance and Sexual Activity  . Alcohol use: No    Alcohol/week: 0.0 standard drinks  . Drug use: Yes    Types: Marijuana    Comment: last use last week, occaisonal marijuana use  . Sexual activity: Not Currently    Birth control/protection: None  Lifestyle  . Physical activity:    Days per week: Not on file    Minutes per session: Not on file  . Stress: Not on file  Relationships  . Social connections:    Talks on phone: Patient refused    Gets together: Patient refused    Attends religious service: Patient refused    Active member of club or organization: Patient refused    Attends meetings of clubs or organizations: Patient refused    Relationship status: Patient refused  . Intimate partner violence:    Fear of current or ex partner: No    Emotionally abused: No    Physically abused: No    Forced sexual activity: No  Other Topics Concern  . Not on file  Social History Narrative  . Not on file    Family History:   Family History  Problem Relation Age of [place]  . Coronary artery disease Father   . Congestive Heart Failure Father   .  Asthma Father   . Cancer Mother        T cell lymphoma     ROS:  Please see the history of present illness.  All other ROS reviewed and negative.     Physical Exam/Data:   Vitals:   05/24/18 2252 05/25/18 0130  05/25/18 0402 05/25/18 0949  BP: (!) 155/95 (!) 147/86 135/75 (!) 152/86  Pulse: 99 82 93 100  Resp: 14 18 (!) 23   Temp:  99 F (37.2 C) 99 F (37.2 C)   TempSrc:  Oral Oral   SpO2: 100% 98% 97%   Weight:      Height:        Intake/Output Summary (Last 24 hours) at 05/25/2018 1111 Last data filed at 05/25/2018 0656 Gross per 24 hour  Intake 1618.85 ml  Output -  Net 1618.85 ml   Last 3 Weights 05/24/2018 05/24/2018 03/27/2018  Weight (lbs) 149 lb 11.1 oz 155 lb 152 lb  Weight (kg) 67.9 kg 70.308 kg 68.947 kg     Body mass index is 24.91 kg/m.   EXAM:  NOt done.  In setting of COVID pandemic and restrictions on contact, reviewed symptoms / history with pt   Reviewed exam in chart.  Will see in AM   EKG:  The EKG was personally reviewed and demonstrates: On 5/1:  SR 82 bpm   Prolonged QT  Nonspecific ST changes   05/25/14:   SR   Nonspecific ST segments  Qtc 516 Telemetry:  Telemetry was not reviewed Relevant CV Studies: Relevant CV Studies: Cardiac cath 02/08/18  Previously placed Dist LM to Ost LAD drug eluting stent is widely patent.  Balloon angioplasty was performed.  Dist LAD lesion is 99% stenosed.  Balloon angioplasty was performed.  Previously placed Prox LAD drug eluting stent is widely patent.  Balloon angioplasty was performed.  Previously placed Mid LAD-1 drug eluting stent is widely patent.  Balloon angioplasty was performed.  Balloon angioplasty was performed.  Prox Cx lesion is 95% stenosed.  Mid Cx lesion is 60% stenosed.  1st Mrg lesion is 70% stenosed.  Prox LAD to Mid LAD lesion is 70% stenosed.  Mid LAD-2 lesion is 40% stenosed.  Ost 1st Mrg to 1st Mrg lesion is 35% stenosed.  Ost 1st Mrg lesion is 70% stenosed.  A stent  was successfully placed.  Post intervention, there is a 0% residual stenosis.  Diagnostic  Dominance: Right   Intervention      Acute coronary syndrome with difficult to identify culprit in the setting of multifocal disease involving the LAD, diagonal, and circumflex coronary arteries.  System delay occurred in achieving revascularization due to atypical symptoms on presentation and time spent deciding upon treatment strategy after anatomy was defined.  Successful ostial to proximal circumflex stent from 95% to 0% with TIMI grade III flow using a 2.25 x 15 Onyx postdilated to 2.75 mm in diameter.. The mid circumflex contains eccentric focal 70% stenosis.  Widely patent left main  LAD is large before branching into a very large first diagonal and the continuation of the LAD. A proximal and mid stent are noted in the LAD followed by an extremely long stent in the mid to distal LAD. In the mid LAD which was not stented there is 75% stenosis. The diagonal contains new concentric 70% ostial narrowing and a mid vessel stent with distal stent margin ISR up to 50%. Beyond the stent there is segmental 80% stenosis.  Mild anteroapical hypokinesis. EF 50%. LVEDP was less than 10.  RECOMMENDATIONS:   Patient should probably have a surgical consultation to see if her targets would be adequate for surgical revascularization if symptoms continue.  If symptoms resolve, medical therapy for now and aggressive risk factor modification.  Discussed with team member, Dr. Quay Burow, who will  be caring for the patient later today.   TTE: 02/08/2018  - Left ventricle: The cavity size was normal. Wall thickness was normal. Basal inferior akinesis. Basal septal hypokinesis. Systolic function was low normal to mildly reduced. The estimated ejection fraction was in the range of 50% to 55%. Features are consistent with a pseudonormal left ventricular filling pattern, with  concomitant abnormal relaxation and increased filling pressure (grade 2 diastolic dysfunction). - Aortic valve: There was no stenosis. - Mitral valve: There was trivial regurgitation. - Right ventricle: The cavity size was normal. Systolic function was normal. - Pulmonary arteries: No complete TR doppler jet so unable to estimate PA systolic pressure. - Inferior vena cava: The vessel was normal in size. The respirophasic diameter changes were in the normal range (>= 50%), consistent with normal central venous pressure.  Impressions:  - Normal LV size with EF 50-55%. Wall motion abnormalities as noted above. Normal RV size and systolic function. No significant valvular abnormalities.    Laboratory Data:  Chemistry Recent Labs  Lab 05/25/18 0151 05/25/18 0635 05/25/18 0916  NA 138 139 136  K 4.1 3.6 4.8  CL 104 103 100  CO2 22 24 18*  GLUCOSE 117* 142* 382*  BUN 10 13 15   CREATININE 0.82 0.95 1.18*  CALCIUM 9.4 9.5 9.5  GFRNONAA >60 >60 54*  GFRAA >60 >60 >60  ANIONGAP 12 12 18*    Recent Labs  Lab 05/24/18 1803  PROT 8.9*  ALBUMIN 4.4  AST 40  ALT 24  ALKPHOS 109  BILITOT 1.0   Hematology Recent Labs  Lab 05/24/18 1715 05/24/18 1845  WBC 12.0*  --   RBC 4.31  --   HGB 12.5 13.3  HCT 38.1 39.0  MCV 88.4  --   MCH 29.0  --   MCHC 32.8  --   RDW 13.8  --   PLT 484*  --    Cardiac Enzymes Recent Labs  Lab 05/24/18 1715 05/24/18 2024 05/25/18 0151 05/25/18 0635  TROPONINI 0.07* 0.10* 0.19* 0.24*   No results for input(s): TROPIPOC in the last 168 hours.  BNPNo results for input(s): BNP, PROBNP in the last 168 hours.  DDimer No results for input(s): DDIMER in the last 168 hours.  Radiology/Studies:  Dg Chest 2 View  Result Date: 05/24/2018 CLINICAL DATA:  Chest pain EXAM: CHEST - 2 VIEW COMPARISON:  02/14/2018 FINDINGS: The heart size and mediastinal contours are within normal limits. Both lungs are clear. The visualized  skeletal structures are unremarkable. IMPRESSION: No active cardiopulmonary disease. Electronically Signed   By: Ulyses Jarred M.D.   On: 05/24/2018 17:58    Assessment and Plan:   48 yo with hx of DM and CAD   PResents to ED with CP  1  CP   The patient had episode today that she has never had in the past    Gone now   I am not convinced this pain was  cardiac in origin. ? GI  The patient also has had intermitt spells of L sided chest pressure that was like her prior angina   She admits to not taking NTG over past week  Stressed    I will review her cath with team.   Her BP was elevated on admit   With this and with DKA would recomm maximizing medical therapy for now.   I do not think this is an acute stent problem as that would represent closure and EKG does not sugg  2  Elevated troponin   Very mild elevation in patient with known CAD and also signif metabolic abnormalities   WOuld follow next trend  She is comfortable now  3   HTN     WOuld increase Rx of IMdur and metoprolol and follow    4  PRolonged QT   WIll d/c phenergan  Check in AM  5  HL  Pt is followed in AEGIS II Trial  COntinue meds   6  DM   Per IM        For questions or updates, please contact [facility] Please consult [email or web address] for contact info under     Signed, Dorris Carnes, MD  05/25/2018 11:11 AM

## 2018-05-25 NOTE — Progress Notes (Signed)
Provider notified about BMP results 

## 2018-05-25 NOTE — Progress Notes (Signed)
   05/24/18 2114  Vitals  Temp 98.4 F (36.9 C)  Temp Source Oral  BP (!) 154/94  MAP (mmHg) 110  BP Location Left Arm  BP Method Automatic  Patient Position (if appropriate) Lying  Pulse Rate (!) 105  Pulse Rate Source Monitor  ECG Heart Rate (!) 104  Cardiac Rhythm ST  Resp 19  Oxygen Therapy  SpO2 100 %  O2 Device Room Air  Height and Weight  Height 5\' 5"  (1.651 m)  Weight 67.9 kg  Type of Scale Used Bed  BSA (Calculated - sq m) 1.76 sq meters  BMI (Calculated) 24.91  Weight in (lb) to have BMI = 25 149.9  MEWS Score  MEWS RR 0  MEWS Pulse 1  MEWS Systolic 0  MEWS LOC 0  MEWS Temp 0  MEWS Score 1  MEWS Score Color Green    Pt admitted to 5W rm 36. Pt  was oriented to unit and how to call for assistance. Pt Bp slightly elevated otherwise VSS.. Fall education completed. Pt verbalized understanding risks associated with falls. Skin intact. No evidence of pressure ulcers or skin break downs. To monitor and treat pt per MD and nursing orders

## 2018-05-25 NOTE — Progress Notes (Signed)
Patient blood sugar recheck 60 mins after blood sugar check of 398.  Notified provider of patients blood sugar 298. Awaiting orders. RN will continue to monitor.

## 2018-05-25 NOTE — Progress Notes (Addendum)
PROGRESS NOTE  Jennifer Hogan VVO:[id number] DOB: 1970/05/27 DOA: 05/24/2018 PCP: [name], NP   LOS: 0 days   Brief Narrative / Interim history: 48 year old female with history of type 1 diabetes mellitus on insulin pump, CAD status post drug-eluting stent x3 in the LAD, also x1 to intermediate branch, with STEMI in January 2020 with LCx stent, comes to the hospital with complaints of 7-day history of intermittent chest pains.  Even in the last couple of days patient has been having pressure-like chest pain radiating bilaterally, and the morning of admission was constant severe central.  She did have associated nausea vomiting.  She was found to be in DKA and was admitted to the hospital.  She had mild elevation of troponin to 0.07  Subjective: She is not feeling good this morning, she feels like she is about to have a hypoglycemic event, feels tremulous.  She also describing chest pain.  After insulin was stopped and she drank some Sprite her sugars went back up to 300 range and started feeling bad again.  She just resumed her insulin pump and sugars now under 200 and feeling better.  Assessment & Plan: Principal Problem:   DKA, type 1 ([place]) Active Problems:   Hypothyroidism   Coronary artery disease   Hyperglycemia due to type 1 diabetes mellitus ([place])   Hyperlipidemia   Principal Problem DKA vs hyperglycemia due to type 1 diabetes mellitus -Patient was started on insulin infusion, this is discontinued this morning as she was about to become hypoglycemic and symptomatic.  We will allow to go back on her insulin pump and closely monitor CBGs  Active Problems Coronary artery disease, concern for NSTEMI -With intermittent chest pain at home as well as this morning, troponin is not significantly elevated but up-trending, however given significant cardiac history including an STEMI few months ago I have consulted cardiology for evaluation. -EKG is abnormal with ST segment  depression in the inferior leads, however to some extent it was present in prior EKGs -Continue aspirin, Plavix.  Patient tells me that she has not missed Plavix doses but did throw off her medications yesterday -Continue Imdur  Hypothyroidism -Continue Synthroid  Hyperlipidemia -Continue statin  Scheduled Meds: . aspirin EC  81 mg Oral Daily  . clopidogrel  75 mg Oral Q breakfast  . enoxaparin (LOVENOX) injection  40 mg Subcutaneous Q24H  . insulin pump   Subcutaneous TID AC, HS, 0200  . ipratropium  2 spray Each Nare QID  . isosorbide mononitrate  15 mg Oral Daily  . levothyroxine  100 mcg Oral QAC breakfast  . metoprolol tartrate  25 mg Oral BID  . montelukast  10 mg Oral QHS  . pantoprazole  40 mg Oral BID  . rosuvastatin  40 mg Oral q1800   Continuous Infusions: . sodium chloride 75 mL/hr at 05/25/18 0924   PRN Meds:.albuterol, nitroGLYCERIN, ondansetron, promethazine **OR** promethazine **OR** promethazine  DVT prophylaxis: lovenox Code Status: Full code Family Communication: no family at bedside  Disposition Plan: TBD  Consultants:   Cardiology   Procedures:   None   Antimicrobials:  None    Objective: Vitals:   05/24/18 2252 05/25/18 0130 05/25/18 0402 05/25/18 0949  BP: (!) 155/95 (!) 147/86 135/75 (!) 152/86  Pulse: 99 82 93 100  Resp: 14 18 (!) 23   Temp:  99 F (37.2 C) 99 F (37.2 C)   TempSrc:  Oral Oral   SpO2: 100% 98% 97%   Weight:  Height:        Intake/Output Summary (Last 24 hours) at 05/25/2018 1104 Last data filed at 05/25/2018 0656 Gross per 24 hour  Intake 1618.85 ml  Output -  Net 1618.85 ml   Filed Weights   05/24/18 1710 05/24/18 2114  Weight: 70.3 kg 67.9 kg    Examination:  Constitutional: NAD Eyes: PERRL, lids and conjunctivae normal ENMT: Mucous membranes are moist.  Respiratory: clear to auscultation bilaterally, no wheezing, no crackles. Normal respiratory effort  Cardiovascular: Regular rate and rhythm, no  murmurs / rubs / gallops. No LE edema. 2+ pedal pulses.  Abdomen: no tenderness. Bowel sounds positive.  Musculoskeletal: no clubbing / cyanosis.  Skin: no rashes, lesions, ulcers. No induration Neurologic: CN 2-12 grossly intact. Strength 5/5 in all 4.  Psychiatric: Normal judgment and insight. Alert and oriented x 3. Normal mood.     Data Reviewed: I have independently reviewed following labs and imaging studies   CBC: Recent Labs  Lab 05/24/18 1715 05/24/18 1845  WBC 12.0*  --   HGB 12.5 13.3  HCT 38.1 39.0  MCV 88.4  --   PLT 484*  --    Basic Metabolic Panel: Recent Labs  Lab 05/24/18 1715 05/24/18 1845 05/24/18 2310 05/25/18 0151 05/25/18 0635 05/25/18 0916  NA 136 137 137 138 139 136  K 3.9 3.6 4.6 4.1 3.6 4.8  CL 100  --  100 104 103 100  CO2 19*  --  19* 22 24 18*  GLUCOSE 349*  --  248* 117* 142* 382*  BUN 10  --  11 10 13 15   CREATININE 1.00  --  0.92 0.82 0.95 1.18*  CALCIUM 9.8  --  9.4 9.4 9.5 9.5   GFR: Estimated Creatinine Clearance: 51.9 mL/min (A) (by C-G formula based on SCr of 1.18 mg/dL (H)). Liver Function Tests: Recent Labs  Lab 05/24/18 1803  AST 40  ALT 24  ALKPHOS 109  BILITOT 1.0  PROT 8.9*  ALBUMIN 4.4   Recent Labs  Lab 05/24/18 1803  LIPASE 21   No results for input(s): AMMONIA in the last 168 hours. Coagulation Profile: No results for input(s): INR, PROTIME in the last 168 hours. Cardiac Enzymes: Recent Labs  Lab 05/24/18 1715 05/24/18 2024 05/25/18 0151 05/25/18 0635  TROPONINI 0.07* 0.10* 0.19* 0.24*   BNP (last 3 results) No results for input(s): PROBNP in the last 8760 hours. HbA1C: No results for input(s): HGBA1C in the last 72 hours. CBG: Recent Labs  Lab 05/25/18 0514 05/25/18 0625 05/25/18 0729 05/25/18 0908 05/25/18 1034  GLUCAP 196* 132* 121* 334* 298*   Lipid Profile: No results for input(s): CHOL, HDL, LDLCALC, TRIG, CHOLHDL, LDLDIRECT in the last 72 hours. Thyroid Function Tests: No  results for input(s): TSH, T4TOTAL, FREET4, T3FREE, THYROIDAB in the last 72 hours. Anemia Panel: No results for input(s): VITAMINB12, FOLATE, FERRITIN, TIBC, IRON, RETICCTPCT in the last 72 hours. Urine analysis:    Component Value Date/Time   COLORURINE STRAW (A) 05/24/2018 2034   APPEARANCEUR CLEAR 05/24/2018 2034   LABSPEC 1.014 05/24/2018 2034   PHURINE 8.0 05/24/2018 2034   GLUCOSEU >=500 (A) 05/24/2018 2034   GLUCOSEU NEGATIVE 11/23/2015 0831   HGBUR LARGE (A) 05/24/2018 2034   BILIRUBINUR NEGATIVE 05/24/2018 2034   BILIRUBINUR neg 12/07/2015 0843   KETONESUR 20 (A) 05/24/2018 2034   PROTEINUR NEGATIVE 05/24/2018 2034   UROBILINOGEN 1.0 12/07/2015 0843   UROBILINOGEN 0.2 11/23/2015 0831   NITRITE NEGATIVE 05/24/2018 2034   LEUKOCYTESUR  NEGATIVE 05/24/2018 2034   Sepsis Labs: Invalid input(s): PROCALCITONIN, LACTICIDVEN  Recent Results (from the past 240 hour(s))  MRSA PCR Screening     Status: None   Collection Time: 05/24/18 10:31 PM  Result Value Ref Range Status   MRSA by PCR NEGATIVE NEGATIVE Final    Comment:        The GeneXpert MRSA Assay (FDA approved for NASAL specimens only), is one component of a comprehensive MRSA colonization surveillance program. It is not intended to diagnose MRSA infection nor to guide or monitor treatment for MRSA infections. Performed at [facility], [place] [address], [place], [place] [address]       Radiology Studies: Dg Chest 2 View  Result Date: 05/24/2018 CLINICAL DATA:  Chest pain EXAM: CHEST - 2 VIEW COMPARISON:  02/14/2018 FINDINGS: The heart size and mediastinal contours are within normal limits. Both lungs are clear. The visualized skeletal structures are unremarkable. IMPRESSION: No active cardiopulmonary disease. Electronically Signed   By: Ulyses Jarred M.D.   On: 05/24/2018 17:58    Marzetta Board, MD, PhD [facility]  Contact via  [email or web address]  [place] P: [phone]  F:  [phone]

## 2018-05-26 DIAGNOSIS — R0789 Other chest pain: Secondary | ICD-10-CM

## 2018-05-26 DIAGNOSIS — E782 Mixed hyperlipidemia: Secondary | ICD-10-CM

## 2018-05-26 DIAGNOSIS — I25118 Atherosclerotic heart disease of native coronary artery with other forms of angina pectoris: Secondary | ICD-10-CM

## 2018-05-26 LAB — URINE CULTURE

## 2018-05-26 LAB — BASIC METABOLIC PANEL
Anion gap: 14 (ref 5–15)
BUN: 16 mg/dL (ref 6–20)
CO2: 19 mmol/L — ABNORMAL LOW (ref 22–32)
Calcium: 8.7 mg/dL — ABNORMAL LOW (ref 8.9–10.3)
Chloride: 104 mmol/L (ref 98–111)
Creatinine, Ser: 1.21 mg/dL — ABNORMAL HIGH (ref 0.44–1.00)
GFR calc Af Amer: >60 mL/min (ref >60)
GFR calc non Af Amer: 52 mL/min — ABNORMAL LOW (ref >60)
Glucose, Bld: 263 mg/dL — ABNORMAL HIGH (ref 70–99)
Potassium: 3.6 mmol/L (ref 3.5–5.1)
Sodium: 137 mmol/L (ref 135–145)

## 2018-05-26 LAB — CBC
HCT: 31.6 % — ABNORMAL LOW (ref 36.0–46.0)
Hemoglobin: 10.5 g/dL — ABNORMAL LOW (ref 12.0–15.0)
MCH: 29.2 pg (ref 26.0–34.0)
MCHC: 33.2 g/dL (ref 30.0–36.0)
MCV: 88 fL (ref 80.0–100.0)
Platelets: 458 10*3/uL — ABNORMAL HIGH (ref 150–400)
RBC: 3.59 MIL/uL — ABNORMAL LOW (ref 3.87–5.11)
RDW: 14.6 % (ref 11.5–15.5)
WBC: 15 10*3/uL — ABNORMAL HIGH (ref 4.0–10.5)
nRBC: 0 % (ref 0.0–0.2)

## 2018-05-26 LAB — GLUCOSE, CAPILLARY
Glucose-Capillary: 273 mg/dL — ABNORMAL HIGH (ref 70–99)
Glucose-Capillary: 351 mg/dL — ABNORMAL HIGH (ref 70–99)
Glucose-Capillary: 164 mg/dL — ABNORMAL HIGH (ref 70–99)
Glucose-Capillary: 168 mg/dL — ABNORMAL HIGH (ref 70–99)
Glucose-Capillary: 360 mg/dL — ABNORMAL HIGH (ref 70–99)

## 2018-05-26 MED ORDER — METOCLOPRAMIDE HCL 5 MG/ML IJ SOLN: 5.0000 mg | Freq: Three times a day (TID) | INTRAMUSCULAR | Status: DC | PRN

## 2018-05-26 MED ORDER — FLUOXETINE HCL 20 MG PO CAPS
40.0000 mg | ORAL_CAPSULE | Freq: Every day | ORAL | Status: DC
  Administered 2018-05-26 – 2018-05-27 (×2): 40 mg via ORAL
  Filled 2018-05-26 (×2): qty 2

## 2018-05-26 MED ORDER — INSULIN DETEMIR 100 UNIT/ML ~~LOC~~ SOLN
15.0000 [IU] | Freq: Every day | SUBCUTANEOUS | Status: DC
  Administered 2018-05-26 – 2018-05-27 (×2): 15 [IU] via SUBCUTANEOUS
  Filled 2018-05-26 (×3): qty 0.15

## 2018-05-26 MED ORDER — SUCRALFATE 1 GM/10ML PO SUSP
1.0000 g | Freq: Three times a day (TID) | ORAL | Status: DC
  Administered 2018-05-26 – 2018-05-27 (×4): 1 g via ORAL
  Filled 2018-05-26 (×4): qty 10

## 2018-05-26 MED ORDER — INSULIN ASPART 100 UNIT/ML ~~LOC~~ SOLN: 0.0000 [IU] | Freq: Three times a day (TID) | SUBCUTANEOUS | Status: DC

## 2018-05-26 MED ORDER — METOCLOPRAMIDE HCL 5 MG/ML IJ SOLN
10.0000 mg | Freq: Once | INTRAMUSCULAR | Status: AC
  Administered 2018-05-26: 10 mg via INTRAVENOUS
  Filled 2018-05-26: qty 2

## 2018-05-26 MED ORDER — INSULIN ASPART 100 UNIT/ML ~~LOC~~ SOLN
0.0000 [IU] | Freq: Four times a day (QID) | SUBCUTANEOUS | Status: DC
  Administered 2018-05-26: 2 [IU] via SUBCUTANEOUS
  Administered 2018-05-26: 21:00:00 9 [IU] via SUBCUTANEOUS
  Administered 2018-05-26: 2 [IU] via SUBCUTANEOUS
  Administered 2018-05-26: 9 [IU] via SUBCUTANEOUS
  Administered 2018-05-27: 5 [IU] via SUBCUTANEOUS

## 2018-05-26 MED ORDER — MORPHINE SULFATE (PF) 2 MG/ML IV SOLN
1.0000 mg | INTRAVENOUS | Status: DC | PRN
  Administered 2018-05-26: 1 mg via INTRAVENOUS
  Filled 2018-05-26: qty 1

## 2018-05-26 NOTE — Progress Notes (Signed)
Pt reported to RN that today her insulin pump site is due to be changed, but she does not have supplies at the hospital to do so nor does she have someone who could drop supplies off. Pt's 0800 CBG was 351. Pt stated she was concerned that basal rate insulin was not being administered effectively due to need for pump site change. RN reported findings to MD.   Plan of care ordered by MD was for pt to turn off her insulin pump (RN witnessed pt do so). Pt was placed on ACHS blood sugar checks with insulin SQ sliding scale scheduled dosages along with with a daily dose of levemir. RN reported change of plans to pt and she verbalized understanding.   RN will continue to monitor pt.

## 2018-05-26 NOTE — Progress Notes (Signed)
PROGRESS NOTE    Jennifer Hogan  JJO:[id number] DOB: May 06, 1970 DOA: 05/24/2018 PCP: [name], NP    Brief Narrative:  48 year old female who presented with chest pain.  She does have significant past medical history for type 1 diabetes mellitus and coronary artery disease status post angioplasty.  Patient reported  intermittent chest pain for the last 7 days, that progressed into constant and severe.  On her initial physical examination blood pressure was 153/91, heart rate 89, respiratory 20, temperature 98.1, oxygen saturation 98%.  She had moist mucous membranes, lungs clear to auscultation bilaterally, heart S1-S2 present with me, abdomen soft, no lower extremity edema.  Sodium 136, potassium 3.9, chloride 100, bicarb 19, glucose 349, BUN 10, creatinine 1.0, anion gap 17, troponin 0.07, white count 12.0, hemoglobin 12.5, hematocrit 38.1, platelets 484.  Urinalysis with  >500 glucose.  Her chest radiograph had no infiltrates, annually corrected QTC 476.   Patient was admitted to the hospital with a working diagnosis of ketoacidosis complicated by atypical chest pain, to rule out acute coronary syndrome.   Assessment & Plan:   Principal Problem:   DKA, type 1 ([place]) Active Problems:   Hypothyroidism   Coronary artery disease   Hyperglycemia due to type 1 diabetes mellitus ([place])   Hyperlipidemia   1. Diabetes ketoacidosis. This am fasting glucose is 263, anion gap 14. Her insulin pump seems to be not working well. Patient with nausea and abdominal pain. Will dc insulin pump and will use insulin sq. Pump settings at 0.675 U per H, total 16 units per day. Will start patient on 15 units of levimir and continue sliding scale for glucose cover and monitoring, calculation of further requirements. Advanced diet as tolerated.   2. T1DM. Will resume insulin therapy sq for now to prevent worsening hyperglycemia and recurrent dka. Advance diet as tolerated.   3. Atypical chest pain/ in  the setting of premature CAD. Troponin is trending down to 0,22, peak 0,24. No frank chest pain and no ekg changes suggesting ACS. No further cardiac workup for now. Continue asa and clopidogrel. Metoprolol and isosorbide.   4. Dyspepsia. Will continue proton pump inhibitors and will add sucralfate. Advance diet as tolerated.   5. Dyslipidemia. Will continue statin therapy.   6. Hypothyroid. Continue levothyroxine.    DVT prophylaxis: enoxaparin   Code Status: full Family Communication: no family at the bedside  Disposition Plan/ discharge barriers: pending clinical improvement   Body mass index is 23.96 kg/m. Malnutrition Type:      Malnutrition Characteristics:      Nutrition Interventions:     RN Pressure Injury Documentation:     Consultants:   Cardiology   Procedures:     Antimicrobials:       Subjective: Patient continue to have abdominal pain, moderate to severe in intensity, has not eat yet. Her pump is malfunctioning and has developed recurrent hyperglycemia.   Objective: Vitals:   05/26/18 0505 05/26/18 0550 05/26/18 0554 05/26/18 0555  BP:    (!) 162/98  Pulse:  (!) 101  (!) 103  Resp:  15  19  Temp:   99.9 F (37.7 C)   TempSrc:   Oral   SpO2:  100%  100%  Weight: 65.3 kg     Height:        Intake/Output Summary (Last 24 hours) at 05/26/2018 1055 Last data filed at 05/26/2018 0900 Gross per 24 hour  Intake 2152.56 ml  Output 500 ml  Net 1652.56 ml  Filed Weights   05/24/18 1710 05/24/18 2114 05/26/18 0505  Weight: 70.3 kg 67.9 kg 65.3 kg    Examination:   General: deconditioned  Neurology: Awake and alert, non focal  E ENT: mild pallor, no icterus, oral mucosa dry Cardiovascular: No JVD. S1-S2 present, rhythmic, no gallops, rubs, or murmurs. No lower extremity edema. Pulmonary: positive breath sounds bilaterally, adequate air movement, no wheezing, rhonchi or rales. Gastrointestinal. Abdomen mild distended, tender to deep  palpation at the mid abdomen, with no organomegaly, no ebound or guarding Skin. No rashes Musculoskeletal: no joint deformities     Data Reviewed: I have personally reviewed following labs and imaging studies  CBC: Recent Labs  Lab 05/24/18 1715 05/24/18 1845 05/26/18 0156  WBC 12.0*  --  15.0*  HGB 12.5 13.3 10.5*  HCT 38.1 39.0 31.6*  MCV 88.4  --  88.0  PLT 484*  --  786*   Basic Metabolic Panel: Recent Labs  Lab 05/25/18 0151 05/25/18 0635 05/25/18 0916 05/25/18 1404 05/26/18 0156  NA 138 139 136 134* 137  K 4.1 3.6 4.8 4.0 3.6  CL 104 103 100 101 104  CO2 22 24 18* 17* 19*  GLUCOSE 117* 142* 382* 317* 263*  BUN 10 13 15 16 16   CREATININE 0.82 0.95 1.18* 1.26* 1.21*  CALCIUM 9.4 9.5 9.5 9.0 8.7*   GFR: Estimated Creatinine Clearance: 50.6 mL/min (A) (by C-G formula based on SCr of 1.21 mg/dL (H)). Liver Function Tests: Recent Labs  Lab 05/24/18 1803  AST 40  ALT 24  ALKPHOS 109  BILITOT 1.0  PROT 8.9*  ALBUMIN 4.4   Recent Labs  Lab 05/24/18 1803  LIPASE 21   No results for input(s): AMMONIA in the last 168 hours. Coagulation Profile: No results for input(s): INR, PROTIME in the last 168 hours. Cardiac Enzymes: Recent Labs  Lab 05/24/18 1715 05/24/18 2024 05/25/18 0151 05/25/18 0635 05/25/18 1404  TROPONINI 0.07* 0.10* 0.19* 0.24* 0.22*   BNP (last 3 results) No results for input(s): PROBNP in the last 8760 hours. HbA1C: No results for input(s): HGBA1C in the last 72 hours. CBG: Recent Labs  Lab 05/25/18 1606 05/25/18 2059 05/25/18 2356 05/26/18 0436 05/26/18 0843  GLUCAP 236* 358* 224* 273* 351*   Lipid Profile: No results for input(s): CHOL, HDL, LDLCALC, TRIG, CHOLHDL, LDLDIRECT in the last 72 hours. Thyroid Function Tests: No results for input(s): TSH, T4TOTAL, FREET4, T3FREE, THYROIDAB in the last 72 hours. Anemia Panel: No results for input(s): VITAMINB12, FOLATE, FERRITIN, TIBC, IRON, RETICCTPCT in the last 72 hours.     Radiology Studies: I have reviewed all of the imaging during this hospital visit personally     Scheduled Meds: . aspirin EC  81 mg Oral Daily  . clopidogrel  75 mg Oral Q breakfast  . enoxaparin (LOVENOX) injection  40 mg Subcutaneous Q24H  . FLUoxetine  40 mg Oral Daily  . insulin aspart  0-9 Units Subcutaneous QID  . insulin detemir  15 Units Subcutaneous Daily  . ipratropium  2 spray Each Nare QID  . isosorbide mononitrate  60 mg Oral Daily  . levothyroxine  100 mcg Oral QAC breakfast  . metoprolol tartrate  50 mg Oral BID  . montelukast  10 mg Oral QHS  . pantoprazole  40 mg Oral BID  . rosuvastatin  40 mg Oral q1800   Continuous Infusions: . sodium chloride 125 mL/hr at 05/26/18 0700     LOS: 1 day  Mauricio Gerome Apley, MD

## 2018-05-26 NOTE — Progress Notes (Signed)
Progress Note   Subjective   Doing well today, the patient denies SOB.  She has occasional chest tightness, worse with pushing on her chest wall and different from her prior angina.  No new concerns  Inpatient Medications    Scheduled Meds: . aspirin EC  81 mg Oral Daily  . clopidogrel  75 mg Oral Q breakfast  . enoxaparin (LOVENOX) injection  40 mg Subcutaneous Q24H  . FLUoxetine  40 mg Oral Daily  . insulin aspart  0-9 Units Subcutaneous QID  . insulin detemir  15 Units Subcutaneous Daily  . ipratropium  2 spray Each Nare QID  . isosorbide mononitrate  60 mg Oral Daily  . levothyroxine  100 mcg Oral QAC breakfast  . metoprolol tartrate  50 mg Oral BID  . montelukast  10 mg Oral QHS  . pantoprazole  40 mg Oral BID  . rosuvastatin  40 mg Oral q1800  . sucralfate  1 g Oral TID WC & HS   Continuous Infusions:  PRN Meds: albuterol, alum & mag hydroxide-simeth, metoCLOPramide (REGLAN) injection, morphine injection, nitroGLYCERIN, traMADol   Vital Signs    Vitals:   05/26/18 0505 05/26/18 0550 05/26/18 0554 05/26/18 0555  BP:    (!) 162/98  Pulse:  (!) 101  (!) 103  Resp:  15  19  Temp:   99.9 F (37.7 C)   TempSrc:   Oral   SpO2:  100%  100%  Weight: 65.3 kg     Height:        Intake/Output Summary (Last 24 hours) at 05/26/2018 1230 Last data filed at 05/26/2018 0900 Gross per 24 hour  Intake 2152.56 ml  Output 500 ml  Net 1652.56 ml   Filed Weights   05/24/18 1710 05/24/18 2114 05/26/18 0505  Weight: 70.3 kg 67.9 kg 65.3 kg    Telemetry    sinus - Personally Reviewed  Physical Exam   GEN- The patient is well appearing, alert and oriented x 3 today.   Head- normocephalic, atraumatic Eyes-  Sclera clear, conjunctiva pink Ears- hearing intact Oropharynx- clear Neck- supple, Lungs-  normal work of breathing Heart- Regular rate and rhythm  GI- soft, NT, ND, + BS Extremities- no clubbing, cyanosis, or edema  MS- no significant deformity or atrophy  Skin- no rash or lesion Psych- euthymic mood, full affect Neuro- strength and sensation are intact   Labs    Chemistry Recent Labs  Lab 05/24/18 1803  05/25/18 0916 05/25/18 1404 05/26/18 0156  NA  --    < > 136 134* 137  K  --    < > 4.8 4.0 3.6  CL  --    < > 100 101 104  CO2  --    < > 18* 17* 19*  GLUCOSE  --    < > 382* 317* 263*  BUN  --    < > 15 16 16   CREATININE  --    < > 1.18* 1.26* 1.21*  CALCIUM  --    < > 9.5 9.0 8.7*  PROT 8.9*  --   --   --   --   ALBUMIN 4.4  --   --   --   --   AST 40  --   --   --   --   ALT 24  --   --   --   --   ALKPHOS 109  --   --   --   --  BILITOT 1.0  --   --   --   --   GFRNONAA  --    < > 54* 50* 52*  GFRAA  --    < > >60 58* >60  ANIONGAP  --    < > 18* 16* 14   < > = values in this interval not displayed.     Hematology Recent Labs  Lab 05/24/18 1715 05/24/18 1845 05/26/18 0156  WBC 12.0*  --  15.0*  RBC 4.31  --  3.59*  HGB 12.5 13.3 10.5*  HCT 38.1 39.0 31.6*  MCV 88.4  --  88.0  MCH 29.0  --  29.2  MCHC 32.8  --  33.2  RDW 13.8  --  14.6  PLT 484*  --  458*    Cardiac Enzymes Recent Labs  Lab 05/24/18 2024 05/25/18 0151 05/25/18 0635 05/25/18 1404  TROPONINI 0.10* 0.19* 0.24* 0.22*   No results for input(s): TROPIPOC in the last 168 hours.     Patient Profile:   Jennifer Hogan is a 48 y.o. female with a hx of CAD  who is being seen today for the evaluation of CP at the request of Dr Cruzita Lederer  Assessment & Plan    1.  Chest pain Mostly atypical  Very minor elevation of troponin in the setting of DKA She has known CAD and feels that current symptoms or different. Continue current medical therapy unless symptoms worsen significantly  2. HTN Stable No change required today  3. HL In AEGIS II trial Continue medicines  4. DM Per primary team  Cardiology will be available as needed  Thompson Grayer MD, [facility] 05/26/2018 12:30 PM

## 2018-05-27 LAB — BASIC METABOLIC PANEL
Anion gap: 10 (ref 5–15)
BUN: 16 mg/dL (ref 6–20)
CO2: 24 mmol/L (ref 22–32)
Calcium: 8.8 mg/dL — ABNORMAL LOW (ref 8.9–10.3)
Chloride: 103 mmol/L (ref 98–111)
Creatinine, Ser: 1.13 mg/dL — ABNORMAL HIGH (ref 0.44–1.00)
GFR calc Af Amer: >60 mL/min (ref >60)
GFR calc non Af Amer: 57 mL/min — ABNORMAL LOW (ref >60)
Glucose, Bld: 188 mg/dL — ABNORMAL HIGH (ref 70–99)
Potassium: 4 mmol/L (ref 3.5–5.1)
Sodium: 137 mmol/L (ref 135–145)

## 2018-05-27 LAB — GLUCOSE, CAPILLARY: Glucose-Capillary: 287 mg/dL — ABNORMAL HIGH (ref 70–99)

## 2018-05-27 MED ORDER — INSULIN ASPART 100 UNIT/ML ~~LOC~~ SOLN: 0.0000 [IU] | Freq: Three times a day (TID) | SUBCUTANEOUS | Status: DC

## 2018-05-27 NOTE — Progress Notes (Signed)
Responded to spiritual care consult. PT not in room. Will try again later on day.  Chaplain Fidel Levy [phone]

## 2018-05-27 NOTE — TOC Transition Note (Signed)
Transition of Care [facility]) - CM/SW Discharge Note   Patient Details  Name: Jennifer Hogan MRN: [id number] Date of Birth: 05-10-1970  [facility] CM/SW Contact:  Zenon Mayo, RN Phone Number: 05/27/2018, 10:25 AM   Clinical Narrative:    From home alone, DKA, she states she has transportation at discharge, she goes to [facility], she is not on any new medications so will not need assistance with meds. NCM asked what could this NCM assist her with prior to dc, she states she has no other needs.   Final next level of care: Home/Self Care Barriers to Discharge: No Barriers Identified   Patient Goals and CMS Choice Patient states their goals for this hospitalization and ongoing recovery are:: to get rest and get house in order   Choice offered to / list presented to : NA  Discharge Placement                       Discharge Plan and Services In-house Referral: NA Discharge Planning Services: CM Consult, [facility], Follow-up appt scheduled, Medication Assistance Post Acute Care Choice: NA          DME Arranged: N/A DME Agency: NA       HH Arranged: NA HH Agency: NA        Social Determinants of Health (SDOH) Interventions     Readmission Risk Interventions Readmission Risk Prevention Plan 05/27/2018  Transportation Screening Complete  Medication Review [employer]) Complete  HRI or [place] Complete  SW Recovery Care/Counseling Consult Complete  [place] Not Applicable  Some recent data might be hidden

## 2018-05-27 NOTE — Discharge Summary (Signed)
Physician Discharge Summary  Jennifer Hogan WJX:[id number] DOB: May 31, 1970 DOA: 05/24/2018  PCP: [name], NP  Admit date: 05/24/2018 Discharge date: 05/27/2018  Admitted From: Home  Disposition:  Home   Recommendations for Outpatient Follow-up and new medication changes:  1. Follow up with [name] NP.  2. Patient ruled out for acute coronary syndrome. 3. Continue with pantoprazole for antiacid therapy.  4. Patient will resume her insulin pump at home, resume basal insulin at 10:30 pm tonight. 5. Continue using bolus short acting insulin.   Home Health: no   Equipment/Devices: no    Discharge Condition: stable  CODE STATUS: full  Diet recommendation: heart healthy and diabetic prudent.   Brief/Interim Summary: 48 year old female who presented with chest pain.  She does have significant past medical history for type 1 diabetes mellitus and coronary artery disease status post angioplasty.  Patient reported  intermittent chest pain for the last 7 days, that progressed into constant and severe.  On her initial physical examination blood pressure was 153/91, heart rate 89, respiratory rate 20, temperature 98.1, oxygen saturation 98%.  She had moist mucous membranes, lungs were clear to auscultation bilaterally, heart S1-S2 present and rhythmic, abdomen soft, no lower extremity edema.  Sodium 136, potassium 3.9, chloride 100, bicarb 19, glucose 349, BUN 10, creatinine 1.0, anion gap 17, troponin 0.07, white count 12.0, hemoglobin 12.5, hematocrit 38.1, platelets 484.  Urinalysis with  >500 glucose.  Her chest radiograph had no infiltrates, manually  corrected QTc 476.   Patient was admitted to the hospital with a working diagnosis of ketoacidosis complicated by atypical chest pain, to rule out acute coronary syndrome.   1.  Diabetes ketoacidosis.  Patient was admitted to the medical ward, she was placed on intravenous insulin with good toleration.  Her anion gap closed and she was  transitioned successfully to subcutaneous insulin.  She did not have her insulin pump supplies with her, she received 15 units of insulin detemir with good toleration.  At home she will resume her insulin pump.  2.  Type 1 diabetes mellitus.  Her fasting glucose this morning is 188, she has received insulin detemir at 8:22 AM, at home she will resume her basal insulin per insulin pump around 10:30 PM, continue using boluses of short acting insulin through the day.  3.  Atypical chest pain, rule out acute coronary syndrome/in the setting of premature coronary artery disease.  Peak troponin I 0.24, no electrocardiographic changes suggesting ischemia.  Possible mild demand ischemia/ruled out for acute coronary syndrome.  Patient continue rosuvastatin, aspirin and clopidogrel, continue metoprolol and isosorbide.  4.  GERD/dyspepsia.  Patient was placed on pantoprazole and sucralfate with improvement of her symptoms.  At discharge we will continue daily pantoprazole.  5.  Dyslipidemia.  Continue rosuvastatin with good toleration.   6.  Hypothyroid.  Continue levothyroxine.  Discharge Diagnoses:  Principal Problem:   DKA, type 1 ([place]) Active Problems:   Hypothyroidism   Coronary artery disease   Hyperglycemia due to type 1 diabetes mellitus ([place])   Hyperlipidemia    Discharge Instructions   Allergies as of 05/27/2018      Reactions   Ciprofloxin Hcl [ciprofloxacin] Nausea And Vomiting, Other (See Comments)   Severe migraine   Food Color Red [red Dye] Diarrhea      Medication List    STOP taking these medications   ondansetron 4 MG tablet Commonly known as:  Zofran     TAKE these medications   albuterol 108 (  90 Base) MCG/ACT inhaler Commonly known as:  VENTOLIN HFA Inhale 2 puffs into the lungs every 6 (six) hours as needed for wheezing or shortness of breath.   aspirin 81 MG EC tablet Take 1 tablet (81 mg total) by mouth daily.   clopidogrel 75 MG tablet Commonly known as:   PLAVIX Take 1 tablet (75 mg total) by mouth daily with breakfast.   FLUoxetine 40 MG capsule Commonly known as:  PROZAC Take 1 capsule (40 mg total) by mouth daily. What changed:  Another medication with the same name was removed. Continue taking this medication, and follow the directions you see here.   insulin aspart 100 UNIT/ML injection Commonly known as:  novoLOG 35 units per insulin pump daily What changed:    how much to take  how to take this  when to take this  additional instructions   ipratropium 0.06 % nasal spray Commonly known as:  Atrovent Place 2 sprays into both nostrils 4 (four) times daily.   isosorbide mononitrate 30 MG 24 hr tablet Commonly known as:  IMDUR Take 0.5 tablets (15 mg total) by mouth daily.   levothyroxine 100 MCG tablet Commonly known as:  Synthroid Take 1 tablet (100 mcg total) by mouth daily before breakfast.   metoprolol tartrate 25 MG tablet Commonly known as:  LOPRESSOR Take 1 tablet (25 mg total) by mouth 2 (two) times daily. Hold for SBP<90 and HR<50   montelukast 10 MG tablet Commonly known as:  SINGULAIR Take 1 tablet (10 mg total) by mouth at bedtime.   nitroGLYCERIN 0.4 MG SL tablet Commonly known as:  NITROSTAT Place 1 tablet (0.4 mg total) under the tongue every 5 (five) minutes as needed for chest pain.   pantoprazole 40 MG tablet Commonly known as:  PROTONIX Take 1 tablet (40 mg total) by mouth 2 (two) times daily. What changed:  when to take this   rosuvastatin 40 MG tablet Commonly known as:  CRESTOR Take 1 tablet (40 mg total) by mouth daily at 6 PM.       Allergies  Allergen Reactions  . Ciprofloxin Hcl [Ciprofloxacin] Nausea And Vomiting and Other (See Comments)    Severe migraine  . Food Color Red [Red Dye] Diarrhea    Consultations:  Cardiology    Procedures/Studies: Dg Chest 2 View  Result Date: 05/24/2018 CLINICAL DATA:  Chest pain EXAM: CHEST - 2 VIEW COMPARISON:  02/14/2018 FINDINGS:  The heart size and mediastinal contours are within normal limits. Both lungs are clear. The visualized skeletal structures are unremarkable. IMPRESSION: No active cardiopulmonary disease. Electronically Signed   By: Ulyses Jarred M.D.   On: 05/24/2018 17:58      Procedures:   Subjective: Patient is feeling better, dyspepsia has improved with antiacids, patient is tolerating po well, no chest pain or dyspnea.   Discharge Exam: Vitals:   05/27/18 0400 05/27/18 0821  BP: 98/60 111/76  Pulse: 76 80  Resp: 16   Temp: 98.2 F (36.8 C)   SpO2: 98%    Vitals:   05/26/18 2300 05/26/18 2349 05/27/18 0400 05/27/18 0821  BP: 93/68  98/60 111/76  Pulse: 79  76 80  Resp: 12  16   Temp:  98.2 F (36.8 C) 98.2 F (36.8 C)   TempSrc:  Oral Oral   SpO2: 99%  98%   Weight:      Height:        General: Not in pain or dyspnea.  Neurology: Awake and alert, non  focal  E ENT: mild pallor, no icterus, oral mucosa moist Cardiovascular: No JVD. S1-S2 present, rhythmic, no gallops, rubs, or murmurs. No lower extremity edema. Pulmonary: psoitive breath sounds bilaterally, adequate air movement, no wheezing, rhonchi or rales. Gastrointestinal. Abdomen with no organomegaly, non tender, no rebound or guarding Skin. No rashes Musculoskeletal: no joint deformities   The results of significant diagnostics from this hospitalization (including imaging, microbiology, ancillary and laboratory) are listed below for reference.     Microbiology: Recent Results (from the past 240 hour(s))  Urine culture     Status: None   Collection Time: 05/24/18  8:38 PM  Result Value Ref Range Status   Specimen Description URINE, RANDOM  Final   Special Requests   Final    NONE Performed at [facility], [address], [place], [place] [address]    Culture   Final    Multiple bacterial morphotypes present, none predominant. Suggest appropriate recollection if clinically indicated.   Report Status 05/26/2018  FINAL  Final  MRSA PCR Screening     Status: None   Collection Time: 05/24/18 10:31 PM  Result Value Ref Range Status   MRSA by PCR NEGATIVE NEGATIVE Final    Comment:        The GeneXpert MRSA Assay (FDA approved for NASAL specimens only), is one component of a comprehensive MRSA colonization surveillance program. It is not intended to diagnose MRSA infection nor to guide or monitor treatment for MRSA infections. Performed at [facility], [place] [address], [place], [place] [address]      Labs: BNP (last 3 results) No results for input(s): BNP in the last 8760 hours. Basic Metabolic Panel: Recent Labs  Lab 05/25/18 0635 05/25/18 0916 05/25/18 1404 05/26/18 0156 05/27/18 0342  NA 139 136 134* 137 137  K 3.6 4.8 4.0 3.6 4.0  CL 103 100 101 104 103  CO2 24 18* 17* 19* 24  GLUCOSE 142* 382* 317* 263* 188*  BUN 13 15 16 16 16   CREATININE 0.95 1.18* 1.26* 1.21* 1.13*  CALCIUM 9.5 9.5 9.0 8.7* 8.8*   Liver Function Tests: Recent Labs  Lab 05/24/18 1803  AST 40  ALT 24  ALKPHOS 109  BILITOT 1.0  PROT 8.9*  ALBUMIN 4.4   Recent Labs  Lab 05/24/18 1803  LIPASE 21   No results for input(s): AMMONIA in the last 168 hours. CBC: Recent Labs  Lab 05/24/18 1715 05/24/18 1845 05/26/18 0156  WBC 12.0*  --  15.0*  HGB 12.5 13.3 10.5*  HCT 38.1 39.0 31.6*  MCV 88.4  --  88.0  PLT 484*  --  458*   Cardiac Enzymes: Recent Labs  Lab 05/24/18 1715 05/24/18 2024 05/25/18 0151 05/25/18 0635 05/25/18 1404  TROPONINI 0.07* 0.10* 0.19* 0.24* 0.22*   BNP: Invalid input(s): POCBNP CBG: Recent Labs  Lab 05/26/18 0843 05/26/18 1159 05/26/18 1707 05/26/18 2101 05/27/18 0809  GLUCAP 351* 164* 168* 360* 287*   D-Dimer No results for input(s): DDIMER in the last 72 hours. Hgb A1c No results for input(s): HGBA1C in the last 72 hours. Lipid Profile No results for input(s): CHOL, HDL, LDLCALC, TRIG, CHOLHDL, LDLDIRECT in the last 72 hours. Thyroid function  studies No results for input(s): TSH, T4TOTAL, T3FREE, THYROIDAB in the last 72 hours.  Invalid input(s): FREET3 Anemia work up No results for input(s): VITAMINB12, FOLATE, FERRITIN, TIBC, IRON, RETICCTPCT in the last 72 hours. Urinalysis    Component Value Date/Time   COLORURINE STRAW (  A) 05/24/2018 2034   APPEARANCEUR CLEAR 05/24/2018 2034   LABSPEC 1.014 05/24/2018 2034   PHURINE 8.0 05/24/2018 2034   GLUCOSEU >=500 (A) 05/24/2018 2034   GLUCOSEU NEGATIVE 11/23/2015 0831   HGBUR LARGE (A) 05/24/2018 2034   BILIRUBINUR NEGATIVE 05/24/2018 2034   BILIRUBINUR neg 12/07/2015 0843   KETONESUR 20 (A) 05/24/2018 2034   PROTEINUR NEGATIVE 05/24/2018 2034   UROBILINOGEN 1.0 12/07/2015 0843   UROBILINOGEN 0.2 11/23/2015 0831   NITRITE NEGATIVE 05/24/2018 2034   LEUKOCYTESUR NEGATIVE 05/24/2018 2034   Sepsis Labs Invalid input(s): PROCALCITONIN,  WBC,  LACTICIDVEN Microbiology Recent Results (from the past 240 hour(s))  Urine culture     Status: None   Collection Time: 05/24/18  8:38 PM  Result Value Ref Range Status   Specimen Description URINE, RANDOM  Final   Special Requests   Final    NONE Performed at [facility], [place] [address], [place], [place] [address]    Culture   Final    Multiple bacterial morphotypes present, none predominant. Suggest appropriate recollection if clinically indicated.   Report Status 05/26/2018 FINAL  Final  MRSA PCR Screening     Status: None   Collection Time: 05/24/18 10:31 PM  Result Value Ref Range Status   MRSA by PCR NEGATIVE NEGATIVE Final    Comment:        The GeneXpert MRSA Assay (FDA approved for NASAL specimens only), is one component of a comprehensive MRSA colonization surveillance program. It is not intended to diagnose MRSA infection nor to guide or monitor treatment for MRSA infections. Performed at [facility], [place] [address] [place], [place] [address]      Time coordinating discharge: 45  minutes  SIGNED:   Tawni Millers, MD  [facility] 05/27/2018, 9:11 AM

## 2018-05-28 ENCOUNTER — Telehealth: Payer: Self-pay

## 2018-05-28 NOTE — Telephone Encounter (Signed)
Transition Care Management Follow-up Telephone Call Date of discharge and from where: 05/27/2018, [facility].  Call placed to patient # [phone], message left requesting a call back to this CM # [phone]

## 2018-05-29 ENCOUNTER — Telehealth: Payer: Self-pay

## 2018-05-29 NOTE — Telephone Encounter (Signed)
Transition Care Management Follow-up Telephone Call #2  Date of discharge and from where: 05/27/2018, [facility].  Call placed to patient # [phone], message left requesting a call back to this CM # [phone]

## 2018-05-30 ENCOUNTER — Encounter: Payer: Self-pay | Admitting: Nurse Practitioner

## 2018-05-30 ENCOUNTER — Other Ambulatory Visit: Payer: Self-pay | Admitting: Nurse Practitioner

## 2018-05-30 ENCOUNTER — Encounter: Payer: Self-pay | Admitting: *Deleted

## 2018-05-30 DIAGNOSIS — Z006 Encounter for examination for normal comparison and control in clinical research program: Secondary | ICD-10-CM

## 2018-05-30 MED ORDER — SUCRALFATE 1 GM/10ML PO SUSP: 1.0000 g | Freq: Two times a day (BID) | ORAL | 0 refills | Status: DC

## 2018-05-30 NOTE — Research (Signed)
V8 on phone due to Covid - 19 pandemic  Pt doing ok. No complaints of cp or sob. Pt was in hospital last week due to gastroparesis and DM2, with chest pains. She has been pretty stressed lately.  No med changes per patient.  She is going over to check on her mom this afternoon. She recently lost her father.                                     "CONSENT"   YES     NO   Continuing further Investigational Product and study visits for follow-up? [x]  []   Continuing consent from future biomedical research [x]  []                                    "EVENTS"    YES     NO  AE   (IF YES SEE SOURCE) [x]  []   SAE  (IF YES SEE SOURCE) [x]  []   ENDPOINT   (IF YES SEE SOURCE) []  [x]   REVASCULARIZATION  (IF YES SEE SOURCE) []  [x]   AMPUTATION   (IF YES SEE SOURCE) []  [x]   TROPONIN'S  (IF YES SEE SOURCE) [x]  []    Lifestyle Adherence Assessment:   YES NO  Abstinence from smoking/remaining tobacco free X   Cardiac Diet X   Routine physical activity and/or cardiac rehabilitation X    EQ-5D-5L MOBILITY:    I HAVE NO PROBLEMS WALKING [x]   I HAVE SLIGHT PROBLEMS WALKING []   I HAVE MODERATE PROBLEMS WALKING []   I HAVE SEVERE PROBLEMS WALKING []   I AM UNABLE TO WALK  []     SELF-CARE:   I HAVE NO PROBLEMS WASING OR DRESSING MYSELF  [x]   I HAVE SLIGHT PROBLEMS WASHING OR DRESSING MYSELF  []   I HAVE MODERATE PROBLEMS WASHING OR DRESSING MYSELF []   I HAVE SEVERE PROBLEMS WASHING OR DRESSING MYSELF  []   I HAVE SEVERE PROBLEMS WASHING OR DRESSING MYSELF  []   I AM UNABLE TO WASH OR DRESS MYSELF []     USUAL ACTIVITIES: (E.G. WORK/STUDY/HOUSEWORK/FAMILY OR LEISURE ACTIVITIES.    I HAVE NO PROBLEMS DOING MY USUAL ACTIVITIES [x]   I HAVE SLIGHT PROBLEMS DOING MY USUAL ACTIVITIES []   I HAVE MODERATE PROBLEMS DOING MY USUAL ACTIVIITIES []   I HAVE SEVERE PROBLEMS DOING MY USUAL ACTIVITIES []   I AM UNABLE TO DO MY USUAL ACTIVITIES []     PAIN /DISCOMFORT   I HAVE NO PAIN OR DISCOMFORT [x]   I HAVE SLIGHT PAIN OR  DISCOMFORT []   I HAVE MODERATE PAIN OR DISCOMFORT []   I HAVE SEVERE PAIN OR DISCOMFORT []   I HAVE EXTREME PAIN OR DISCOMFORT []     ANXIETY/DEPRESSION   I AM NOT ANXIOUS OR DEPRESSED [x]   I AM SLIGHTLY ANXIOUS OR DEPRESSED []   I AM MODERATELY ANXIOUS OR DREPRESSED []   I AM SEVERELY ANXIOUS OR DEPRESSED []   I AM EXTREMELY ANXIOUS OR DEPRESSED []     SCALE OF 0-100 HOW WOULD YOU RATE TODAY?  0 IS THE WORSE AND 100 IS THE BEST HEALTH YOU CAN IMAGINE: 90    Current Outpatient Medications:  .  aspirin 81 MG EC tablet, Take 1 tablet (81 mg total) by mouth daily., Disp: 90 tablet, Rfl: 0 .  clopidogrel (PLAVIX) 75 MG tablet, Take 1 tablet (75 mg total) by mouth daily with breakfast., Disp: 30  tablet, Rfl: 11 .  insulin aspart (NOVOLOG) 100 UNIT/ML injection, 35 units per insulin pump daily (Patient taking differently: Inject 35 Units into the skin daily. insulin pump daily), Disp: 10 mL, Rfl: 11 .  ipratropium (ATROVENT) 0.06 % nasal spray, Place 2 sprays into both nostrils 4 (four) times daily. (Patient taking differently: Place 2 sprays into both nostrils 2 (two) times a day. ), Disp: 15 mL, Rfl: 0 .  isosorbide mononitrate (IMDUR) 30 MG 24 hr tablet, Take 0.5 tablets (15 mg total) by mouth daily. (Patient taking differently: Take 15 mg by mouth daily as needed (chest tightness). ), Disp: 30 tablet, Rfl: 11 .  levothyroxine (SYNTHROID) 100 MCG tablet, Take 1 tablet (100 mcg total) by mouth daily before breakfast., Disp: 30 tablet, Rfl: 0 .  metoprolol tartrate (LOPRESSOR) 25 MG tablet, Take 1 tablet (25 mg total) by mouth 2 (two) times daily. Hold for SBP<90 and HR<50, Disp: 60 tablet, Rfl: 11 .  montelukast (SINGULAIR) 10 MG tablet, Take 1 tablet (10 mg total) by mouth at bedtime., Disp: 90 tablet, Rfl: 0 .  nitroGLYCERIN (NITROSTAT) 0.4 MG SL tablet, Place 1 tablet (0.4 mg total) under the tongue every 5 (five) minutes as needed for chest pain., Disp: 30 tablet, Rfl: 0 .  pantoprazole (PROTONIX)  40 MG tablet, Take 1 tablet (40 mg total) by mouth 2 (two) times daily., Disp: 70 tablet, Rfl: 0 .  rosuvastatin (CRESTOR) 40 MG tablet, Take 1 tablet (40 mg total) by mouth daily at 6 PM., Disp: 30 tablet, Rfl: 11 .  albuterol (PROVENTIL HFA;VENTOLIN HFA) 108 (90 Base) MCG/ACT inhaler, Inhale 2 puffs into the lungs every 6 (six) hours as needed for wheezing or shortness of breath., Disp: 1 Inhaler, Rfl: 0 .  FLUoxetine (PROZAC) 40 MG capsule, Take 1 capsule (40 mg total) by mouth daily., Disp: 30 capsule, Rfl: 0

## 2018-05-31 ENCOUNTER — Encounter: Payer: Self-pay | Admitting: Endocrinology

## 2018-06-03 ENCOUNTER — Encounter: Payer: Self-pay | Admitting: Endocrinology

## 2018-06-03 ENCOUNTER — Telehealth: Payer: Self-pay | Admitting: Nutrition

## 2018-06-03 ENCOUNTER — Other Ambulatory Visit: Payer: Self-pay

## 2018-06-03 ENCOUNTER — Inpatient Hospital Stay: Payer: Self-pay | Admitting: Nurse Practitioner

## 2018-06-05 ENCOUNTER — Telehealth: Payer: Self-pay

## 2018-06-05 NOTE — Telephone Encounter (Signed)
NS for 06/03/18 appt. LVM requesting returned call re: rescheduling appt

## 2018-06-06 MED FILL — ?HUMALOG 100 UNITS/ML VIAL: 100 | 28 days supply | Qty: 10 | Fill #0

## 2018-06-11 NOTE — Telephone Encounter (Signed)
Appointment scheduled for pump start and review in two weeks.  Pt. Reports that father just died, and is dealing with a lot right now.

## 2018-06-18 ENCOUNTER — Encounter: Payer: Self-pay | Attending: Nutrition | Admitting: Nutrition

## 2018-06-20 ENCOUNTER — Telehealth: Payer: Self-pay | Admitting: Nutrition

## 2018-06-20 NOTE — Telephone Encounter (Signed)
Message left on voice mail that she missed her appointment to start auto mode.  Asked her to call to reschedule.  Telephone number given

## 2018-07-08 ENCOUNTER — Encounter: Payer: Self-pay | Admitting: Nurse Practitioner

## 2018-07-08 NOTE — Telephone Encounter (Signed)
Please refill accordingly.

## 2018-07-10 ENCOUNTER — Encounter: Payer: Self-pay | Admitting: Nurse Practitioner

## 2018-07-10 MED ORDER — LEVOTHYROXINE SODIUM 100 MCG PO TABS: 100.0000 ug | ORAL_TABLET | Freq: Every day | ORAL | 0 refills | Status: DC

## 2018-07-12 MED FILL — ?HUMALOG 100 UNITS/ML VIAL: 100 | 28 days supply | Qty: 10 | Fill #1

## 2018-07-16 ENCOUNTER — Other Ambulatory Visit: Payer: Self-pay | Admitting: Nurse Practitioner

## 2018-07-16 DIAGNOSIS — E1069 Type 1 diabetes mellitus with other specified complication: Secondary | ICD-10-CM

## 2018-07-23 ENCOUNTER — Other Ambulatory Visit: Payer: Self-pay

## 2018-07-23 ENCOUNTER — Ambulatory Visit: Payer: Self-pay | Attending: Nurse Practitioner

## 2018-07-23 DIAGNOSIS — E1069 Type 1 diabetes mellitus with other specified complication: Secondary | ICD-10-CM

## 2018-07-24 LAB — CBC
Hematocrit: 38.9 % (ref 34.0–46.6)
Hemoglobin: 12.8 g/dL (ref 11.1–15.9)
MCH: 29.5 pg (ref 26.6–33.0)
MCHC: 32.9 g/dL (ref 31.5–35.7)
MCV: 90 fL (ref 79–97)
Platelets: 412 10*3/uL (ref 150–450)
RBC: 4.34 x10E6/uL (ref 3.77–5.28)
RDW: 14.4 % (ref 11.7–15.4)
WBC: 6.7 10*3/uL (ref 3.4–10.8)

## 2018-07-24 LAB — BASIC METABOLIC PANEL
BUN/Creatinine Ratio: 17 (ref 9–23)
BUN: 16 mg/dL (ref 6–24)
CO2: 21 mmol/L (ref 20–29)
Calcium: 9.7 mg/dL (ref 8.7–10.2)
Chloride: 101 mmol/L (ref 96–106)
Creatinine, Ser: 0.94 mg/dL (ref 0.57–1.00)
GFR calc Af Amer: 82 mL/min/{1.73_m2} (ref >59)
GFR calc non Af Amer: 71 mL/min/{1.73_m2} (ref >59)
Glucose: 210 mg/dL — ABNORMAL HIGH (ref 65–99)
Potassium: 4.4 mmol/L (ref 3.5–5.2)
Sodium: 136 mmol/L (ref 134–144)

## 2018-07-24 LAB — LIPID PANEL
Chol/HDL Ratio: 3.4 ratio (ref 0.0–4.4)
Cholesterol, Total: 215 mg/dL — ABNORMAL HIGH (ref 100–199)
HDL: 63 mg/dL (ref >39)
LDL Calculated: 121 mg/dL — ABNORMAL HIGH (ref 0–99)
Triglycerides: 157 mg/dL — ABNORMAL HIGH (ref 0–149)
VLDL Cholesterol Cal: 31 mg/dL (ref 5–40)

## 2018-07-24 LAB — HEMOGLOBIN A1C
Est. average glucose Bld gHb Est-mCnc: 223 mg/dL
Hgb A1c MFr Bld: 9.4 % — ABNORMAL HIGH (ref 4.8–5.6)

## 2018-07-24 LAB — TSH: TSH: 1.83 u[IU]/mL (ref 0.450–4.500)

## 2018-08-19 ENCOUNTER — Encounter: Payer: Self-pay | Admitting: *Deleted

## 2018-08-19 DIAGNOSIS — Z006 Encounter for examination for normal comparison and control in clinical research program: Secondary | ICD-10-CM

## 2018-08-19 MED FILL — ?HUMALOG 100 UNITS/ML VIAL: 100 | 28 days supply | Qty: 10 | Fill #2

## 2018-08-26 NOTE — Research (Signed)
Late Entry:  Visit conducted by phone due to Covid-19 Pt doing well, no complaints of cp or sob.  She is just trying to stay healthy. No med changes per pt.                                      "CONSENT"   YES     NO   Continuing further Investigational Product and study visits for follow-up? [x]  []   Continuing consent from future biomedical research [x]  []                                    "EVENTS"    YES     NO  AE   (IF YES SEE SOURCE) []  [x]   SAE  (IF YES SEE SOURCE) []  [x]   ENDPOINT   (IF YES SEE SOURCE) []  [x]   REVASCULARIZATION  (IF YES SEE SOURCE) []  [x]   AMPUTATION   (IF YES SEE SOURCE) []  [x]   TROPONIN'S  (IF YES SEE SOURCE) []  [x]    Lifestyle Adherence Assessment:   YES NO  Abstinence from smoking/remaining tobacco free X   Cardiac Diet X   Routine physical activity and/or cardiac rehabilitation X      Current Outpatient Medications:  .  aspirin 81 MG EC tablet, Take 1 tablet (81 mg total) by mouth daily., Disp: 90 tablet, Rfl: 0 .  clopidogrel (PLAVIX) 75 MG tablet, Take 1 tablet (75 mg total) by mouth daily with breakfast., Disp: 30 tablet, Rfl: 11 .  insulin aspart (NOVOLOG) 100 UNIT/ML injection, 35 units per insulin pump daily (Patient taking differently: Inject 35 Units into the skin daily. insulin pump daily), Disp: 10 mL, Rfl: 11 .  ipratropium (ATROVENT) 0.06 % nasal spray, Place 2 sprays into both nostrils 4 (four) times daily. (Patient taking differently: Place 2 sprays into both nostrils 2 (two) times a day. ), Disp: 15 mL, Rfl: 0 .  isosorbide mononitrate (IMDUR) 30 MG 24 hr tablet, Take 0.5 tablets (15 mg total) by mouth daily. (Patient taking differently: Take 15 mg by mouth daily as needed (chest tightness). ), Disp: 30 tablet, Rfl: 11 .  metoprolol tartrate (LOPRESSOR) 25 MG tablet, Take 1 tablet (25 mg total) by mouth 2 (two) times daily. Hold for SBP<90 and HR<50, Disp: 60 tablet, Rfl: 11 .  nitroGLYCERIN (NITROSTAT) 0.4 MG SL tablet, Place 1 tablet  (0.4 mg total) under the tongue every 5 (five) minutes as needed for chest pain., Disp: 30 tablet, Rfl: 0 .  pantoprazole (PROTONIX) 40 MG tablet, Take 1 tablet (40 mg total) by mouth 2 (two) times daily., Disp: 70 tablet, Rfl: 0 .  rosuvastatin (CRESTOR) 40 MG tablet, Take 1 tablet (40 mg total) by mouth daily at 6 PM., Disp: 30 tablet, Rfl: 11 .  albuterol (PROVENTIL HFA;VENTOLIN HFA) 108 (90 Base) MCG/ACT inhaler, Inhale 2 puffs into the lungs every 6 (six) hours as needed for wheezing or shortness of breath., Disp: 1 Inhaler, Rfl: 0 .  EUTHYROX 100 MCG tablet, TAKE 1 TABLET BY MOUTH ONCE DAILY BEFORE BREAKFAST, Disp: 30 tablet, Rfl: 0 .  FLUoxetine (PROZAC) 40 MG capsule, Take 1 capsule (40 mg total) by mouth daily., Disp: 30 capsule, Rfl: 0 .  montelukast (SINGULAIR) 10 MG tablet, TAKE 1 TABLET BY MOUTH AT BEDTIME, Disp: 90 tablet, Rfl: 0 .  sucralfate (CARAFATE)  1 GM/10ML suspension, Take 10 mLs (1 g total) by mouth 2 (two) times daily for 30 days., Disp: 600 mL, Rfl: 0

## 2018-09-16 ENCOUNTER — Other Ambulatory Visit: Payer: Self-pay | Admitting: Nurse Practitioner

## 2018-09-16 DIAGNOSIS — J309 Allergic rhinitis, unspecified: Secondary | ICD-10-CM

## 2018-09-24 MED FILL — ?HUMALOG 100 UNITS/ML VIAL: 100 | 28 days supply | Qty: 10 | Fill #3

## 2018-10-02 ENCOUNTER — Encounter: Payer: Self-pay | Admitting: Endocrinology

## 2018-10-02 ENCOUNTER — Ambulatory Visit: Payer: Self-pay | Admitting: Endocrinology

## 2018-10-02 ENCOUNTER — Other Ambulatory Visit: Payer: Self-pay

## 2018-10-02 VITALS — BP 90/62 | HR 85 | Ht 65.0 in | Wt 152.8 lb

## 2018-10-02 DIAGNOSIS — E119 Type 2 diabetes mellitus without complications: Secondary | ICD-10-CM

## 2018-10-02 DIAGNOSIS — E1059 Type 1 diabetes mellitus with other circulatory complications: Secondary | ICD-10-CM

## 2018-10-02 LAB — POCT GLYCOSYLATED HEMOGLOBIN (HGB A1C): Hemoglobin A1C: 9.7 % — AB (ref 4.0–5.6)

## 2018-10-02 NOTE — Progress Notes (Signed)
Subjective:    [name], female    DOB: [birth year], 48 y.o.   MRN: [id number]  HPI Pt returns for f/u of diabetes mellitus:  DM type: 1  Dx'ed: [id number] Complications: CAD.   Therapy: insulin since dx.  DKA: never Severe hypoglycemia: never.  Pancreatitis: never Other: she has been on pump (medtronic paradigm) rx since 2014; she stopped continuous glucose monitor, due to cost; rx is limited by lack of health insurance; she is educator, and also caregiver for her parents.   Interval history: She takes these pumpsettings:   basal rate of 0.675 units/hr, 24 HRS per day.  She suspends for activity.   mealtime bolus of 1 unit/17 grams of carbohydrate.   correction bolus (which some people call "sensitivity," or "insulin sensitivity ratio," or just "isr") of 1 unit for each 60 by which your glucose exceeds 100.   no cbg record, but states cbg's vary from 60-460.  Pt says she has frequent hypoglycemia in the middle of the night.  There is otherwise no trend throughout the day.   TDD is approx 26 units (62% basal).   Meter is downloaded today, and the printout is scanned into the record.  cbg varies from 80-415.  It is in general lower after exercise  cbg's are mildly low approx twice per week (usually with activity).  There is no trend throughout the day.  Pt says she never misses the boluses.   Past Medical History:  Diagnosis Date  . Allergic state 08/07/2015  . Anemia   . Anxiety   . Coronary artery disease    a. s/pPTCA DES x3 in the LAD (ostial DES placed, mid DES placed, distal DES placed)and PTCA/DES x1to intermediate branch 02/2017. b. STEMI 02/08/18 with LCx stent placement.  . Depression   . Diabetes mellitus    type 1, on insulin pump  . DKA (diabetic ketoacidoses) ([place]) 12/31/2017  . Elevated platelet count   . High cholesterol   . Hypothyroidism   . Vomiting and diarrhea 12/31/2017    Past Surgical History:  Procedure Laterality Date  . APPENDECTOMY     . CORONARY STENT INTERVENTION N/A 03/20/2017   Procedure: DES x 3 LAD, DES OM; Surgeon: Burnell Blanks, MD;  Location: [facility];  Service: Cardiovascular;  Laterality: N/A;  . CORONARY/GRAFT ACUTE MI REVASCULARIZATION N/A 02/08/2018   Procedure: Coronary/Graft Acute MI Revascularization;  Surgeon: Belva Crome, MD;  Location: [facility];  Service: Cardiovascular;  Laterality: N/A;  . LEFT HEART CATH AND CORONARY ANGIOGRAPHY N/A 03/20/2017   Procedure: LEFT HEART CATH AND CORONARY ANGIOGRAPHY;  Surgeon: Burnell Blanks, MD;  Location: [facility];  Service: Cardiovascular;  Laterality: N/A;  . LEFT HEART CATH AND CORONARY ANGIOGRAPHY N/A 02/08/2018   Procedure: LEFT HEART CATH AND CORONARY ANGIOGRAPHY;  Surgeon: Belva Crome, MD;  Location: [facility];  Service: Cardiovascular;  Laterality: N/A;    Social History   Socioeconomic History  . Marital status: Divorced    Spouse name: Not on file  . Number of children: Not on file  . Years of education: Not on file  . Highest education level: Not on file  Occupational History  . 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 Financial resource strain: Somewhat hard  . Food insecurity    Worry: Never true    Inability: Never true  . Transportation needs    Medical: No  Non-medical: No  Tobacco Use  . Smoking status: Never Smoker  . Smokeless tobacco: Never Used  . Tobacco comment: Use marijuana  Substance and Sexual Activity  . Alcohol use: No    Alcohol/week: 0.0 standard drinks  . Drug use: Yes    Types: Marijuana    Comment: last use last week, occaisonal marijuana use  . Sexual activity: Not Currently    Birth control/protection: None  Lifestyle  . Physical activity    Days per week: Not on file    Minutes per session: Not on file  . Stress: Not on file  Relationships  . Social Herbalist on phone: Patient refused    Gets together: Patient refused     Attends religious service: Patient refused    Active member of club or organization: Patient refused    Attends meetings of clubs or organizations: Patient refused    Relationship status: Patient refused  . Intimate partner violence    Fear of current or ex partner: No    Emotionally abused: No    Physically abused: No    Forced sexual activity: No  Other Topics Concern  . Not on file  Social History Narrative  . Not on file    Current Outpatient Medications on File Prior to Visit  Medication Sig Dispense Refill  . aspirin 81 MG EC tablet Take 1 tablet (81 mg total) by mouth daily. 90 tablet 0  . clopidogrel (PLAVIX) 75 MG tablet Take 1 tablet (75 mg total) by mouth daily with breakfast. 30 tablet 11  . EUTHYROX 100 MCG tablet TAKE 1 TABLET BY MOUTH ONCE DAILY BEFORE BREAKFAST 30 tablet 0  . insulin aspart (NOVOLOG) 100 UNIT/ML injection 35 units per insulin pump daily (Patient taking differently: Inject 35 Units into the skin daily. insulin pump daily) 10 mL 11  . ipratropium (ATROVENT) 0.06 % nasal spray Place 2 sprays into both nostrils 4 (four) times daily. (Patient taking differently: Place 2 sprays into both nostrils 2 (two) times a day. ) 15 mL 0  . isosorbide mononitrate (IMDUR) 30 MG 24 hr tablet Take 0.5 tablets (15 mg total) by mouth daily. (Patient taking differently: Take 15 mg by mouth daily as needed (chest tightness). ) 30 tablet 11  . metoprolol tartrate (LOPRESSOR) 25 MG tablet Take 1 tablet (25 mg total) by mouth 2 (two) times daily. Hold for SBP<90 and HR<50 60 tablet 11  . montelukast (SINGULAIR) 10 MG tablet TAKE 1 TABLET BY MOUTH AT BEDTIME 90 tablet 0  . nitroGLYCERIN (NITROSTAT) 0.4 MG SL tablet Place 1 tablet (0.4 mg total) under the tongue every 5 (five) minutes as needed for chest pain. 30 tablet 0  . rosuvastatin (CRESTOR) 40 MG tablet Take 1 tablet (40 mg total) by mouth daily at 6 PM. 30 tablet 11  . albuterol (PROVENTIL HFA;VENTOLIN HFA) 108 (90 Base)  MCG/ACT inhaler Inhale 2 puffs into the lungs every 6 (six) hours as needed for wheezing or shortness of breath. 1 Inhaler 0  . FLUoxetine (PROZAC) 40 MG capsule Take 1 capsule (40 mg total) by mouth daily. 30 capsule 0  . pantoprazole (PROTONIX) 40 MG tablet Take 1 tablet (40 mg total) by mouth 2 (two) times daily. 70 tablet 0  . sucralfate (CARAFATE) 1 GM/10ML suspension Take 10 mLs (1 g total) by mouth 2 (two) times daily for 30 days. 600 mL 0   No current facility-administered medications on file prior to visit.  Allergies  Allergen Reactions  . Ciprofloxin Hcl [Ciprofloxacin] Nausea And Vomiting and Other (See Comments)    Severe migraine  . Food Color Red [Red Dye] Diarrhea    Family History  Problem Relation Age of Onset  . Coronary artery disease Father   . Congestive Heart Failure Father   . Asthma Father   . Cancer Mother        T cell lymphoma    BP 90/62 (BP Location: Left Arm, Patient Position: Sitting)   Pulse 85   Ht 5\' 5"  (1.651 m)   Wt 152 lb 12.8 oz (69.3 kg)   SpO2 99%   BMI 25.43 kg/m   Review of Systems Denies LOC.      Objective:   Physical Exam VITAL SIGNS:  See vs page GENERAL: no distress Pulses: dorsalis pedis intact bilat.   MSK: no deformity of the feet CV: no leg edema Skin:  no ulcer on the feet.  normal color and temp on the feet. Neuro: sensation is intact to touch on the feet.     A1c=9.7%    Assessment & Plan:  Ty[e 1DM, with CAD: worse.  We discussed the possibility of eliminating mealtime boluses Hypoglycemia: this limits aggressiveness of glycemic control   Patient Instructions  check your blood sugar 8 times a day.  vary the time of day when you check, between before the 3 meals, and at bedtime.  also check if you have symptoms of your blood sugar being too high or too low.  please keep a record of the readings and bring it to your next appointment here.  please call us sooner if your blood sugar goes below 70, or if you  have a lot of readings over 200.   Please continue these pump settings:  basal rate of 1.6 units/hr, 6 AM-10 PM, and 0.5 units/hr overnight.  Suspend for activity for 1-2 hrs No mealtime bolus.  correction bolus (which some people call "sensitivity," or "insulin sensitivity ratio," or just "isr") of 1 unit for each 60 by which your glucose exceeds 100.    Please come back for a follow-up appointment in 1 month.

## 2018-10-02 NOTE — Patient Instructions (Addendum)
check your blood sugar 8 times a day.  vary the time of day when you check, between before the 3 meals, and at bedtime.  also check if you have symptoms of your blood sugar being too high or too low.  please keep a record of the readings and bring it to your next appointment here.  please call us sooner if your blood sugar goes below 70, or if you have a lot of readings over 200.   Please continue these pump settings:  basal rate of 1.6 units/hr, 6 AM-10 PM, and 0.5 units/hr overnight.  Suspend for activity for 1-2 hrs No mealtime bolus.  correction bolus (which some people call "sensitivity," or "insulin sensitivity ratio," or just "isr") of 1 unit for each 60 by which your glucose exceeds 100.    Please come back for a follow-up appointment in 1 month.

## 2018-10-22 ENCOUNTER — Other Ambulatory Visit: Payer: Self-pay | Admitting: Nurse Practitioner

## 2018-10-23 NOTE — Research (Signed)
Patient was seen today with Ms. Burgin. Doing well since discharge.  Ambulated as noted.  Reviewed purpose and nature of study. VS. As noted.  Lungs clear.  Cardiac rhythm was regular.  No edema.   [place] [employer] [place].   Loretha Brasil. Lia Foyer, MD, [facility]

## 2018-10-27 ENCOUNTER — Encounter: Payer: Self-pay | Admitting: Physician Assistant

## 2018-10-27 NOTE — Progress Notes (Deleted)
Cardiology Office Note    Date:  10/27/2018   ID:  Jennifer Hogan, DOB [birth year], MRN [id number]  PCP:  [name], NP  Cardiologist:  Fransico Him, MD  Electrophysiologist:  None   Chief Complaint: 6 month f/u CAD  History of Present Illness:   Jennifer Hogan is a 48 y.o. female with history of multivessel CAD s/p PCIs below, ICM, IDDM, anxiety, depression, HLD, hypothyroidism who presents for 6 month follow-up.  To recap cardiac history in 02/2017 she was found to have NSTEMI with multivessel disease and EF 45-50%. She underwent stenting x 4 with  PTCA/DES x 3 in the LAD (ostial DES placed, mid DES placed, distal DES placed) as well as PTCA/DES intermediate branch. She was admitted with ACS (ST elevation but relatively low troponins) in 05/2018. Cath was difficult to identify a culprit vessel given multivessel disease. She underwent DES to ostial-prox Cx. LVEF was 50% at that time. 2D echo 01/2018 showed EF 50-55%, grade 2 DD, basal inferior akinesis, basal septal hypokinesis. Last labs 06/2018 K 4.4, Cr 0.94, CBC wnl, LDL 121, trig 157, A1C 9.4, TSH wnl.  lipids Jardiance?  CAD Ischemic cardiomyopathy Hyperlipidemia IDDM  Past Medical History:  Diagnosis Date  . Anemia   . Anxiety   . Coronary artery disease    a. s/pPTCA DES x3 in the LAD (ostial DES placed, mid DES placed, distal DES placed)and PTCA/DES x1to intermediate branch 02/2017. b. ACS 02/08/18 with LCx stent placement.  . Depression   . Diabetes mellitus    type 1, on insulin pump  . DKA (diabetic ketoacidoses) ([place]) 12/31/2017  . Elevated platelet count   . High cholesterol   . Hypothyroidism   . Ischemic cardiomyopathy    a. EF low-normal with basal inferior akinesis, basal septal hypokinesis by echo 01/2018.    Past Surgical History:  Procedure Laterality Date  . APPENDECTOMY    . CORONARY STENT INTERVENTION N/A 03/20/2017   Procedure: DES x 3 LAD, DES OM; Surgeon: Burnell Blanks,  MD;  Location: [facility];  Service: Cardiovascular;  Laterality: N/A;  . CORONARY/GRAFT ACUTE MI REVASCULARIZATION N/A 02/08/2018   Procedure: Coronary/Graft Acute MI Revascularization;  Surgeon: Belva Crome, MD;  Location: [facility];  Service: Cardiovascular;  Laterality: N/A;  . LEFT HEART CATH AND CORONARY ANGIOGRAPHY N/A 03/20/2017   Procedure: LEFT HEART CATH AND CORONARY ANGIOGRAPHY;  Surgeon: Burnell Blanks, MD;  Location: [facility];  Service: Cardiovascular;  Laterality: N/A;  . LEFT HEART CATH AND CORONARY ANGIOGRAPHY N/A 02/08/2018   Procedure: LEFT HEART CATH AND CORONARY ANGIOGRAPHY;  Surgeon: Belva Crome, MD;  Location: [facility];  Service: Cardiovascular;  Laterality: N/A;    Current Medications: No outpatient medications have been marked as taking for the 10/29/18 encounter (Appointment) with Charlie Pitter, PA-C.   ***   Allergies:   Ciprofloxin hcl [ciprofloxacin] and Food color red [red dye]   Social History   Socioeconomic History  . Marital status: Divorced    Spouse name: Not on file  . Number of children: Not on file  . Years of education: Not on file  . Highest education level: Not on file  Occupational History  . 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 Financial resource strain: Somewhat hard  . Food insecurity    Worry: Never true    Inability: Never true  . Transportation needs  Medical: No    Non-medical: No  Tobacco Use  . Smoking status: Never Smoker  . Smokeless tobacco: Never Used  . Tobacco comment: Use marijuana  Substance and Sexual Activity  . Alcohol use: No    Alcohol/week: 0.0 standard drinks  . Drug use: Yes    Types: Marijuana    Comment: last use last week, occaisonal marijuana use  . Sexual activity: Not Currently    Birth control/protection: None  Lifestyle  . Physical activity    Days per week: Not on file    Minutes per session: Not on file  . Stress: Not on  file  Relationships  . Social Herbalist on phone: Patient refused    Gets together: Patient refused    Attends religious service: Patient refused    Active member of club or organization: Patient refused    Attends meetings of clubs or organizations: Patient refused    Relationship status: Patient refused  Other Topics Concern  . Not on file  Social History Narrative  . Not on file     Family History:  The patient's ***family history includes Asthma in her father; Cancer in her mother; Congestive Heart Failure in her father; Coronary artery disease in her father.  ROS:   Please see the history of present illness. Otherwise, review of systems is positive for ***.  All other systems are reviewed and otherwise negative.    EKGs/Labs/Other Studies Reviewed:    Studies reviewed were summarized above.   EKG:  EKG is ordered today, personally reviewed, demonstrating ***  Recent Labs: 02/10/2018: Magnesium 2.0 05/24/2018: ALT 24 07/23/2018: BUN 16; Creatinine, Ser 0.94; Hemoglobin 12.8; Platelets 412; Potassium 4.4; Sodium 136; TSH 1.830  Recent Lipid Panel    Component Value Date/Time   CHOL 215 (H) 07/23/2018 1008   TRIG 157 (H) 07/23/2018 1008   HDL 63 07/23/2018 1008   CHOLHDL 3.4 07/23/2018 1008   CHOLHDL 3.2 02/08/2018 0517   VLDL 21 02/08/2018 0517   LDLCALC 121 (H) 07/23/2018 1008    PHYSICAL EXAM:    VS:  There were no vitals taken for this visit.  BMI: There is no height or weight on file to calculate BMI.  GEN: Well nourished, well developed, in no acute distress HEENT: normocephalic, atraumatic Neck: no JVD, carotid bruits, or masses Cardiac: ***RRR; no murmurs, rubs, or gallops, no edema  Respiratory:  clear to auscultation bilaterally, normal work of breathing GI: soft, nontender, nondistended, + BS MS: no deformity or atrophy Skin: warm and dry, no rash Neuro:  Alert and Oriented x 3, Strength and sensation are intact, follows commands Psych:  euthymic mood, full affect  Wt Readings from Last 3 Encounters:  10/02/18 152 lb 12.8 oz (69.3 kg)  05/26/18 143 lb 15.4 oz (65.3 kg)  03/27/18 152 lb (68.9 kg)     ASSESSMENT & PLAN:   1. ***  Disposition: F/u with ***   Medication Adjustments/Labs and Tests Ordered: Current medicines are reviewed at length with the patient today.  Concerns regarding medicines are outlined above. Medication changes, Labs and Tests ordered today are summarized above and listed in the Patient Instructions accessible in Encounters.   Signed, Charlie Pitter, PA-C  10/27/2018 2:14 PM    [facility] [address], [place], [place]  [address] Phone: [phone]; Fax: [phone]

## 2018-10-29 ENCOUNTER — Encounter (HOSPITAL_COMMUNITY)
Admission: EM | Disposition: A | Discharge status: Home / Self Care | Payer: Self-pay | Source: Home / Self Care | Attending: Cardiology

## 2018-10-29 ENCOUNTER — Encounter (HOSPITAL_COMMUNITY): Payer: Self-pay | Admitting: Emergency Medicine

## 2018-10-29 ENCOUNTER — Other Ambulatory Visit: Payer: Self-pay

## 2018-10-29 ENCOUNTER — Encounter: Payer: Self-pay | Admitting: Endocrinology

## 2018-10-29 ENCOUNTER — Inpatient Hospital Stay (HOSPITAL_COMMUNITY)
Admission: EM | Admit: 2018-10-29 | Discharge: 2018-11-09 | DRG: 234 | Disposition: A | Discharge status: Home / Self Care | Payer: Self-pay | Attending: Cardiothoracic Surgery | Admitting: Cardiothoracic Surgery

## 2018-10-29 ENCOUNTER — Emergency Department (HOSPITAL_COMMUNITY): Payer: Self-pay

## 2018-10-29 ENCOUNTER — Telehealth: Payer: Self-pay | Admitting: Physician Assistant

## 2018-10-29 ENCOUNTER — Other Ambulatory Visit: Payer: Self-pay | Admitting: *Deleted

## 2018-10-29 ENCOUNTER — Ambulatory Visit: Payer: Self-pay | Admitting: Physician Assistant

## 2018-10-29 ENCOUNTER — Ambulatory Visit: Payer: Self-pay | Admitting: Endocrinology

## 2018-10-29 DIAGNOSIS — Z807 Family history of other malignant neoplasms of lymphoid, hematopoietic and related tissues: Secondary | ICD-10-CM

## 2018-10-29 DIAGNOSIS — Z9689 Presence of other specified functional implants: Secondary | ICD-10-CM

## 2018-10-29 DIAGNOSIS — Z888 Allergy status to other drugs, medicaments and biological substances status: Secondary | ICD-10-CM

## 2018-10-29 DIAGNOSIS — Z7902 Long term (current) use of antithrombotics/antiplatelets: Secondary | ICD-10-CM

## 2018-10-29 DIAGNOSIS — F1290 Cannabis use, unspecified, uncomplicated: Secondary | ICD-10-CM | POA: Diagnosis present

## 2018-10-29 DIAGNOSIS — Z818 Family history of other mental and behavioral disorders: Secondary | ICD-10-CM

## 2018-10-29 DIAGNOSIS — Z79899 Other long term (current) drug therapy: Secondary | ICD-10-CM

## 2018-10-29 DIAGNOSIS — K3184 Gastroparesis: Secondary | ICD-10-CM | POA: Diagnosis present

## 2018-10-29 DIAGNOSIS — Z825 Family history of asthma and other chronic lower respiratory diseases: Secondary | ICD-10-CM

## 2018-10-29 DIAGNOSIS — F419 Anxiety disorder, unspecified: Secondary | ICD-10-CM | POA: Diagnosis present

## 2018-10-29 DIAGNOSIS — Z9641 Presence of insulin pump (external) (internal): Secondary | ICD-10-CM | POA: Diagnosis present

## 2018-10-29 DIAGNOSIS — I252 Old myocardial infarction: Secondary | ICD-10-CM

## 2018-10-29 DIAGNOSIS — I429 Cardiomyopathy, unspecified: Secondary | ICD-10-CM

## 2018-10-29 DIAGNOSIS — I2584 Coronary atherosclerosis due to calcified coronary lesion: Secondary | ICD-10-CM | POA: Diagnosis present

## 2018-10-29 DIAGNOSIS — Z8249 Family history of ischemic heart disease and other diseases of the circulatory system: Secondary | ICD-10-CM

## 2018-10-29 DIAGNOSIS — I110 Hypertensive heart disease with heart failure: Secondary | ICD-10-CM | POA: Diagnosis present

## 2018-10-29 DIAGNOSIS — I5022 Chronic systolic (congestive) heart failure: Secondary | ICD-10-CM | POA: Diagnosis present

## 2018-10-29 DIAGNOSIS — Z20828 Contact with and (suspected) exposure to other viral communicable diseases: Secondary | ICD-10-CM | POA: Diagnosis present

## 2018-10-29 DIAGNOSIS — I959 Hypotension, unspecified: Secondary | ICD-10-CM | POA: Diagnosis present

## 2018-10-29 DIAGNOSIS — E876 Hypokalemia: Secondary | ICD-10-CM | POA: Diagnosis not present

## 2018-10-29 DIAGNOSIS — Z9889 Other specified postprocedural states: Secondary | ICD-10-CM

## 2018-10-29 DIAGNOSIS — I2510 Atherosclerotic heart disease of native coronary artery without angina pectoris: Secondary | ICD-10-CM

## 2018-10-29 DIAGNOSIS — E039 Hypothyroidism, unspecified: Secondary | ICD-10-CM | POA: Diagnosis present

## 2018-10-29 DIAGNOSIS — I5023 Acute on chronic systolic (congestive) heart failure: Secondary | ICD-10-CM

## 2018-10-29 DIAGNOSIS — Z23 Encounter for immunization: Secondary | ICD-10-CM

## 2018-10-29 DIAGNOSIS — E1059 Type 1 diabetes mellitus with other circulatory complications: Secondary | ICD-10-CM

## 2018-10-29 DIAGNOSIS — D62 Acute posthemorrhagic anemia: Secondary | ICD-10-CM | POA: Diagnosis not present

## 2018-10-29 DIAGNOSIS — E785 Hyperlipidemia, unspecified: Secondary | ICD-10-CM | POA: Diagnosis present

## 2018-10-29 DIAGNOSIS — E7800 Pure hypercholesterolemia, unspecified: Secondary | ICD-10-CM | POA: Diagnosis present

## 2018-10-29 DIAGNOSIS — I25119 Atherosclerotic heart disease of native coronary artery with unspecified angina pectoris: Secondary | ICD-10-CM | POA: Diagnosis present

## 2018-10-29 DIAGNOSIS — I214 Non-ST elevation (NSTEMI) myocardial infarction: Principal | ICD-10-CM | POA: Diagnosis present

## 2018-10-29 DIAGNOSIS — Z794 Long term (current) use of insulin: Secondary | ICD-10-CM

## 2018-10-29 DIAGNOSIS — Z955 Presence of coronary angioplasty implant and graft: Secondary | ICD-10-CM

## 2018-10-29 DIAGNOSIS — R000 Tachycardia, unspecified: Secondary | ICD-10-CM | POA: Diagnosis present

## 2018-10-29 DIAGNOSIS — Z951 Presence of aortocoronary bypass graft: Secondary | ICD-10-CM

## 2018-10-29 DIAGNOSIS — E109 Type 1 diabetes mellitus without complications: Secondary | ICD-10-CM | POA: Diagnosis present

## 2018-10-29 DIAGNOSIS — E1043 Type 1 diabetes mellitus with diabetic autonomic (poly)neuropathy: Secondary | ICD-10-CM | POA: Diagnosis present

## 2018-10-29 DIAGNOSIS — F329 Major depressive disorder, single episode, unspecified: Secondary | ICD-10-CM | POA: Diagnosis present

## 2018-10-29 DIAGNOSIS — Z7982 Long term (current) use of aspirin: Secondary | ICD-10-CM

## 2018-10-29 DIAGNOSIS — I255 Ischemic cardiomyopathy: Secondary | ICD-10-CM | POA: Diagnosis present

## 2018-10-29 DIAGNOSIS — Z79890 Hormone replacement therapy (postmenopausal): Secondary | ICD-10-CM

## 2018-10-29 DIAGNOSIS — Z008 Encounter for other general examination: Secondary | ICD-10-CM

## 2018-10-29 HISTORY — PX: LEFT HEART CATH AND CORONARY ANGIOGRAPHY: CATH118249

## 2018-10-29 LAB — POCT I-STAT EG7
Acid-base deficit: 6 mmol/L — ABNORMAL HIGH (ref 0.0–2.0)
Bicarbonate: 17.6 mmol/L — ABNORMAL LOW (ref 20.0–28.0)
Calcium, Ion: 1.12 mmol/L — ABNORMAL LOW (ref 1.15–1.40)
HCT: 41 % (ref 36.0–46.0)
Hemoglobin: 13.9 g/dL (ref 12.0–15.0)
O2 Saturation: 64 %
Potassium: 3.5 mmol/L (ref 3.5–5.1)
Sodium: 136 mmol/L (ref 135–145)
TCO2: 18 mmol/L — ABNORMAL LOW (ref 22–32)
pCO2, Ven: 27.9 mmHg — ABNORMAL LOW (ref 44.0–60.0)
pH, Ven: 7.409 (ref 7.250–7.430)
pO2, Ven: 32 mmHg (ref 32.0–45.0)

## 2018-10-29 LAB — CBG MONITORING, ED: Glucose-Capillary: 212 mg/dL — ABNORMAL HIGH (ref 70–99)

## 2018-10-29 LAB — CBC
HCT: 39.5 % (ref 36.0–46.0)
Hemoglobin: 13.4 g/dL (ref 12.0–15.0)
MCH: 30.2 pg (ref 26.0–34.0)
MCHC: 33.9 g/dL (ref 30.0–36.0)
MCV: 89.2 fL (ref 80.0–100.0)
Platelets: 430 10*3/uL — ABNORMAL HIGH (ref 150–400)
RBC: 4.43 MIL/uL (ref 3.87–5.11)
RDW: 13.2 % (ref 11.5–15.5)
WBC: 9.6 10*3/uL (ref 4.0–10.5)
nRBC: 0 % (ref 0.0–0.2)

## 2018-10-29 LAB — BASIC METABOLIC PANEL
Anion gap: 17 — ABNORMAL HIGH (ref 5–15)
BUN: 24 mg/dL — ABNORMAL HIGH (ref 6–20)
CO2: 17 mmol/L — ABNORMAL LOW (ref 22–32)
Calcium: 9 mg/dL (ref 8.9–10.3)
Chloride: 102 mmol/L (ref 98–111)
Creatinine, Ser: 1.15 mg/dL — ABNORMAL HIGH (ref 0.44–1.00)
GFR calc Af Amer: >60 mL/min (ref >60)
GFR calc non Af Amer: 56 mL/min — ABNORMAL LOW (ref >60)
Glucose, Bld: 237 mg/dL — ABNORMAL HIGH (ref 70–99)
Potassium: 3.5 mmol/L (ref 3.5–5.1)
Sodium: 136 mmol/L (ref 135–145)

## 2018-10-29 LAB — GLUCOSE, CAPILLARY
Glucose-Capillary: 177 mg/dL — ABNORMAL HIGH (ref 70–99)
Glucose-Capillary: 321 mg/dL — ABNORMAL HIGH (ref 70–99)

## 2018-10-29 LAB — SARS CORONAVIRUS 2 BY RT PCR (HOSPITAL ORDER, PERFORMED IN ~~LOC~~ HOSPITAL LAB): SARS Coronavirus 2: NEGATIVE

## 2018-10-29 LAB — TROPONIN I (HIGH SENSITIVITY)
Troponin I (High Sensitivity): 1138 ng/L (ref <18)
Troponin I (High Sensitivity): 1132 ng/L (ref <18)

## 2018-10-29 LAB — PROTIME-INR
INR: 1.1 (ref 0.8–1.2)
Prothrombin Time: 14.2 seconds (ref 11.4–15.2)

## 2018-10-29 SURGERY — LEFT HEART CATH AND CORONARY ANGIOGRAPHY
Anesthesia: LOCAL

## 2018-10-29 MED ORDER — HEPARIN (PORCINE) 25000 UT/250ML-% IV SOLN
800.0000 [IU]/h | INTRAVENOUS | Status: DC
  Administered 2018-10-29: 15:00:00 800 [IU]/h via INTRAVENOUS
  Filled 2018-10-29: qty 250

## 2018-10-29 MED ORDER — HEPARIN (PORCINE) IN NACL 1000-0.9 UT/500ML-% IV SOLN
INTRAVENOUS | Status: AC
  Filled 2018-10-29: qty 1000

## 2018-10-29 MED ORDER — HEPARIN (PORCINE) 25000 UT/250ML-% IV SOLN
1000.0000 [IU]/h | INTRAVENOUS | Status: DC
  Administered 2018-10-30: 800 [IU]/h via INTRAVENOUS
  Administered 2018-10-31: 22:00:00 950 [IU]/h via INTRAVENOUS
  Administered 2018-11-03: 1000 [IU]/h via INTRAVENOUS
  Filled 2018-10-29 (×4): qty 250

## 2018-10-29 MED ORDER — SODIUM CHLORIDE 0.9 % IV SOLN
INTRAVENOUS | Status: AC | PRN
  Administered 2018-10-29: 60 mL/h via INTRAVENOUS

## 2018-10-29 MED ORDER — MORPHINE SULFATE (PF) 4 MG/ML IV SOLN
4.0000 mg | Freq: Once | INTRAVENOUS | Status: AC
  Administered 2018-10-29: 4 mg via INTRAVENOUS
  Filled 2018-10-29: qty 1

## 2018-10-29 MED ORDER — SODIUM CHLORIDE 0.9 % IV SOLN
INTRAVENOUS | Status: AC
  Administered 2018-10-30: 01:00:00 1000 mL via INTRAVENOUS

## 2018-10-29 MED ORDER — HEPARIN (PORCINE) IN NACL 1000-0.9 UT/500ML-% IV SOLN
INTRAVENOUS | Status: AC
  Filled 2018-10-29: qty 500

## 2018-10-29 MED ORDER — HEPARIN (PORCINE) IN NACL 1000-0.9 UT/500ML-% IV SOLN
INTRAVENOUS | Status: DC | PRN
  Administered 2018-10-29 (×2): 500 mL

## 2018-10-29 MED ORDER — HEPARIN BOLUS VIA INFUSION
4000.0000 [IU] | Freq: Once | INTRAVENOUS | Status: AC
  Administered 2018-10-29: 4000 [IU] via INTRAVENOUS
  Filled 2018-10-29: qty 4000

## 2018-10-29 MED ORDER — LIDOCAINE HCL (PF) 1 % IJ SOLN
INTRAMUSCULAR | Status: DC | PRN
  Administered 2018-10-29: 3 mL

## 2018-10-29 MED ORDER — HEPARIN SODIUM (PORCINE) 1000 UNIT/ML IJ SOLN
INTRAMUSCULAR | Status: DC | PRN
  Administered 2018-10-29: 3500 [IU] via INTRAVENOUS

## 2018-10-29 MED ORDER — HEPARIN SODIUM (PORCINE) 1000 UNIT/ML IJ SOLN
INTRAMUSCULAR | Status: AC
  Filled 2018-10-29: qty 1

## 2018-10-29 MED ORDER — HEPARIN (PORCINE) IN NACL 1000-0.9 UT/500ML-% IV SOLN
INTRAVENOUS | Status: DC | PRN
  Administered 2018-10-29: 500 mL

## 2018-10-29 MED ORDER — VERAPAMIL HCL 2.5 MG/ML IV SOLN
INTRAVENOUS | Status: AC
  Filled 2018-10-29: qty 2

## 2018-10-29 MED ORDER — CHLORHEXIDINE GLUCONATE CLOTH 2 % EX PADS
6.0000 | MEDICATED_PAD | Freq: Every day | CUTANEOUS | Status: DC
  Administered 2018-10-29 – 2018-11-09 (×8): 6 via TOPICAL

## 2018-10-29 MED ORDER — IOHEXOL 350 MG/ML SOLN
INTRAVENOUS | Status: DC | PRN
  Administered 2018-10-29: 17:00:00 80 mL via INTRA_ARTERIAL

## 2018-10-29 MED ORDER — SODIUM CHLORIDE 0.9 % IV BOLUS
1000.0000 mL | Freq: Once | INTRAVENOUS | Status: AC
  Administered 2018-10-29: 1000 mL via INTRAVENOUS

## 2018-10-29 MED ORDER — FAMOTIDINE IN NACL 20-0.9 MG/50ML-% IV SOLN
20.0000 mg | Freq: Once | INTRAVENOUS | Status: AC
  Administered 2018-10-29: 12:00:00 20 mg via INTRAVENOUS
  Filled 2018-10-29: qty 50

## 2018-10-29 MED ORDER — ONDANSETRON HCL 4 MG/2ML IJ SOLN
4.0000 mg | Freq: Once | INTRAMUSCULAR | Status: AC
  Administered 2018-10-29: 4 mg via INTRAVENOUS
  Filled 2018-10-29: qty 2

## 2018-10-29 MED ORDER — LIDOCAINE HCL (PF) 1 % IJ SOLN
INTRAMUSCULAR | Status: AC
  Filled 2018-10-29: qty 30

## 2018-10-29 MED ORDER — VERAPAMIL HCL 2.5 MG/ML IV SOLN
INTRAVENOUS | Status: DC | PRN
  Administered 2018-10-29: 16:00:00 10 mL via INTRA_ARTERIAL

## 2018-10-29 SURGICAL SUPPLY — 12 items
CATH 5FR JL3.5 JR4 ANG PIG MP (CATHETERS) ×1 IMPLANT
DEVICE RAD COMP TR BAND LRG (VASCULAR PRODUCTS) ×2 IMPLANT
ELECT DEFIB PAD ADLT CADENCE (PAD) ×1 IMPLANT
GLIDESHEATH SLEND SS 6F .021 (SHEATH) ×1 IMPLANT
GUIDEWIRE INQWIRE 1.5J.035X260 (WIRE) IMPLANT
INQWIRE 1.5J .035X260CM (WIRE) ×2
KIT HEART LEFT (KITS) ×2 IMPLANT
PACK CARDIAC CATHETERIZATION (CUSTOM PROCEDURE TRAY) ×2 IMPLANT
SHEATH PROBE COVER 6X72 (BAG) ×1 IMPLANT
SYR MEDRAD MARK 7 150ML (SYRINGE) ×2 IMPLANT
TRANSDUCER W/STOPCOCK (MISCELLANEOUS) ×2 IMPLANT
TUBING CIL FLEX 10 FLL-RA (TUBING) ×2 IMPLANT

## 2018-10-29 NOTE — Telephone Encounter (Signed)
   Received notification from R. Barrett PA-C that pt missed appt because she had presented to the ED around appointment time instead. Appears she is being admitted for NSTEMI. Rhonda requests we go ahead and reschedule her appointment - will need to be within [facility] timeframe. Have r/s for October 15th at 11:40am with Dr. Radford Pax. Since patient is just being admitted right now I do not have information on dispo/timing of DC but will cc to [facility] team to follow along to make sure she gets the [facility] call when discharged. I also added appointment info to hospital chart. Zurie Platas PA-C

## 2018-10-29 NOTE — Progress Notes (Signed)
TCTS consulted for CABG evaluation. °

## 2018-10-29 NOTE — H&P (Addendum)
Cardiology Admission History and Physical:   Patient ID: NETTYE KUTTER; MRN: [id number]; DOB: 12-24-1970   Admission date: 10/29/2018  Primary Care Provider: [name], NP Primary Cardiologist: Fransico Him, MD 07/16/2017 Primary Electrophysiologist:  None  Chief Complaint:  Chest pain  Patient Profile:   LOUANN HUEBERT is a 48 y.o. female with a history of NSTEMI 02/2017 s/p PTCA/DES x3 to LAD (ostial , mid, distal), PTCA/DES oRamus. Several stenoses apical LAD>>med rx (Too distal for intervention) Admitted 02/08/18 with STEMI, C/O aching in L breast. Cath on that admit showed: LM patent; LAD  75% mid (between 2 stents); D1 70% ostial (new), 50%mid instent; 80% distal; LCx (small) 95% ostial, 50% mid; RCA patent.  Pt underwent PTCA/DES to prox/osital LCx (Onyx stent). Echo LVEF 50 to 55%. Hx DM, HTN, HLD (AEGIS trial), hypothyroid.  History of Present Illness:   Ms. Pezzuti was admitted 05/2018 with chest pain, in the setting of DKA, stress and high BP. Troponin peak was 0.24  She has been struggling with gastroparesis and managing that, plus the diabetes.  Late Friday, she ate a [employer] cup. After that, she developed N&V, some diarrhea. Heart pounding fast, may have been irregular at times. Threw up everything.   She continued to have problems w/ abdominal cramping, but less vomiting. However, was having problems eating. Kept trying to get enough liquids in, but always felt dry.   The heart pounding was the worst on Monday. She had chest aching on Sunday, 5-6/10. Worse w/ deep inspiration. Tried 1/2 tab isosorbide, no help. Could not find SL NTG. Took metoprolol but BP was low.   Monday, the pounding was worse and she had chest pain that started in the middle and spread. Felt like a burning pain, 8/10. Did not feel like any meds helped. She was hurting all over. Tried a hydrocodone, no help, threw it up. Felt a little better if she pressed on her chest.   Today, she  was extremely weak and came to the ER. She got 4 mg Zofran, 4 mg MSO4, Pepcid 20 mg IV, 1000 ml NaCL.  The chest pain resolved. She can still feel her heart beat. SBP was dropping into the 60s at times, is a little better now, but still drops into the 80s.   She has never felt her heart beat erratically before. She has felt it beat fast, but not off-rhythm.    Past Medical History:  Diagnosis Date  . Anemia   . Anxiety   . Coronary artery disease    a. s/pPTCA DES x3 in the LAD (ostial DES placed, mid DES placed, distal DES placed)and PTCA/DES x1to intermediate branch 02/2017. b. ACS 02/08/18 with LCx stent placement.  . Depression   . Diabetes mellitus    type 1, on insulin pump  . DKA (diabetic ketoacidoses) ([place]) 12/31/2017  . Elevated platelet count   . High cholesterol   . Hypothyroidism   . Ischemic cardiomyopathy    a. EF low-normal with basal inferior akinesis, basal septal hypokinesis by echo 01/2018.    Past Surgical History:  Procedure Laterality Date  . APPENDECTOMY    . CORONARY STENT INTERVENTION N/A 03/20/2017   Procedure: DES x 3 LAD, DES OM; Surgeon: Burnell Blanks, MD;  Location: [place] [facility];  Service: Cardiovascular;  Laterality: N/A;  . CORONARY/GRAFT ACUTE MI REVASCULARIZATION N/A 02/08/2018   Procedure: Coronary/Graft Acute MI Revascularization;  Surgeon: Belva Crome, MD;  Location: [place] [facility];  Service: Cardiovascular;  Laterality: N/A;  . LEFT HEART CATH AND CORONARY ANGIOGRAPHY N/A 03/20/2017   Procedure: LEFT HEART CATH AND CORONARY ANGIOGRAPHY;  Surgeon: Burnell Blanks, MD;  Location: [place] [facility];  Service: Cardiovascular;  Laterality: N/A;  . LEFT HEART CATH AND CORONARY ANGIOGRAPHY N/A 02/08/2018   Procedure: LEFT HEART CATH AND CORONARY ANGIOGRAPHY;  Surgeon: Belva Crome, MD;  Location: [place] [facility];  Service: Cardiovascular;  Laterality: N/A;     Medications Prior to Admission: Prior to Admission  medications   Medication Sig Start Date End Date Taking? Authorizing Provider  albuterol (PROVENTIL HFA;VENTOLIN HFA) 108 (90 Base) MCG/ACT inhaler Inhale 2 puffs into the lungs every 6 (six) hours as needed for wheezing or shortness of breath. 12/07/17 03/04/18  Damita Lack, MD  aspirin 81 MG EC tablet Take 1 tablet (81 mg total) by mouth daily. 12/07/17   Damita Lack, MD  clopidogrel (PLAVIX) 75 MG tablet Take 1 tablet (75 mg total) by mouth daily with breakfast. 02/12/18   Kathyrn Drown D, NP  FLUoxetine (PROZAC) 40 MG capsule Take 1 capsule (40 mg total) by mouth daily. 12/07/17 05/27/18  Amin, Jeanella Flattery, MD  insulin aspart (NOVOLOG) 100 UNIT/ML injection 35 units per insulin pump daily Patient taking differently: Inject 35 Units into the skin daily. insulin pump daily 10/31/17   Renato Shin, MD  ipratropium (ATROVENT) 0.06 % nasal spray Place 2 sprays into both nostrils 4 (four) times daily. Patient taking differently: Place 2 sprays into both nostrils 2 (two) times a day.  02/27/18   Tasia Catchings, Amy V, PA-C  isosorbide mononitrate (IMDUR) 30 MG 24 hr tablet Take 0.5 tablets (15 mg total) by mouth daily. Patient taking differently: Take 15 mg by mouth daily as needed (chest tightness).  02/11/18   Kathyrn Drown D, NP  levothyroxine (EUTHYROX) 100 MCG tablet Take 1 tablet (100 mcg total) by mouth daily before breakfast. Must have office visit for refills 10/23/18   Charlott Rakes, MD  metoprolol tartrate (LOPRESSOR) 25 MG tablet Take 1 tablet (25 mg total) by mouth 2 (two) times daily. Hold for SBP<90 and HR<50 02/11/18   Kathyrn Drown D, NP  montelukast (SINGULAIR) 10 MG tablet TAKE 1 TABLET BY MOUTH AT BEDTIME 09/16/18   [name], NP  nitroGLYCERIN (NITROSTAT) 0.4 MG SL tablet Place 1 tablet (0.4 mg total) under the tongue every 5 (five) minutes as needed for chest pain. 12/07/17   Amin, Jeanella Flattery, MD  pantoprazole (PROTONIX) 40 MG tablet Take 1 tablet (40 mg total) by mouth 2  (two) times daily. 03/27/18 08/26/18  [name], NP  rosuvastatin (CRESTOR) 40 MG tablet Take 1 tablet (40 mg total) by mouth daily at 6 PM. 02/11/18   Tommie Raymond, NP  sucralfate (CARAFATE) 1 GM/10ML suspension Take 10 mLs (1 g total) by mouth 2 (two) times daily for 30 days. 05/30/18 06/29/18  [name], NP     Allergies:    Allergies  Allergen Reactions  . Ciprofloxin Hcl [Ciprofloxacin] Nausea And Vomiting and Other (See Comments)    Severe migraine  . Food Color Red [Red Dye] Diarrhea    Social History:   Social History   Socioeconomic History  . Marital status: Divorced    Spouse name: Not on file  . Number of children: Not on file  . Years of education: Not on file  . Highest education level: Not on file  Occupational History  . 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 Financial resource strain: Somewhat hard  . Food insecurity    Worry: Never true    Inability: Never true  . Transportation needs    Medical: No    Non-medical: No  Tobacco Use  . Smoking status: Never Smoker  . Smokeless tobacco: Never Used  . Tobacco comment: Use marijuana  Substance and Sexual Activity  . Alcohol use: No    Alcohol/week: 0.0 standard drinks  . Drug use: Yes    Types: Marijuana    Comment: last use last week, occaisonal marijuana use  . Sexual activity: Not Currently    Birth control/protection: None  Lifestyle  . Physical activity    Days per week: Not on file    Minutes per session: Not on file  . Stress: Not on file  Relationships  . Social Herbalist on phone: Patient refused    Gets together: Patient refused    Attends religious service: Patient refused    Active member of club or organization: Patient refused    Attends meetings of clubs or organizations: Patient refused    Relationship status: Patient refused  . Intimate partner violence    Fear of current or ex partner: No    Emotionally abused: No    Physically abused: No     Forced sexual activity: No  Other Topics Concern  . Not on file  Social History Narrative  . Not on file    Family History:   The patient's family history includes Asthma in her father; Cancer in her mother; Congestive Heart Failure in her father; Coronary artery disease in her father.   The patient She indicated that her mother is alive. She indicated that her father is alive.   ROS:  Please see the history of present illness.  All other ROS reviewed and negative.     Physical Exam/Data:   Vitals:   10/29/18 1315 10/29/18 1330 10/29/18 1400 10/29/18 1435  BP: 90/65 92/75    Pulse: 95 (!) 104    Resp: (!) 25 19    Temp:      TempSrc:      SpO2: 98% 99%    Weight:   68 kg 68 kg  Height:    5\' 5"  (1.651 m)    Intake/Output Summary (Last 24 hours) at 10/29/2018 1437 Last data filed at 10/29/2018 1313 Gross per 24 hour  Intake 1050 ml  Output -  Net 1050 ml   Filed Weights   10/29/18 1400 10/29/18 1435  Weight: 68 kg 68 kg   Body mass index is 24.96 kg/m.  General:  Well nourished, well developed, in no acute distress HEENT: normal Lymph: no adenopathy Neck:  JVD not elevated Endocrine:  No thryomegaly Vascular: No carotid bruits; FA pulses 2+ bilaterally without bruits  Cardiac:  normal S1, S2; RRR; no murmur, no rub or gallop  Lungs:  clear to auscultation bilaterally, no wheezing, rhonchi or rales  Abd: soft, nontender, no hepatomegaly  Ext: no edema Musculoskeletal:  No deformities, BUE and BLE strength a little weak, but equal Skin: warm and dry  Neuro:  CNs 2-12 intact, no focal abnormalities noted Psych:  Normal affect    EKG:  The ECG that was done 10/06 was personally reviewed and demonstrates ST,  HR 106, lateral deep T wave inversions and (to a lesser extent), inferior ST/T wave changes are seen. Different from 05/2018  Relevant CV Studies:  ECHO: 02/08/2018 - Left ventricle: The cavity size was normal. Wall thickness was   normal. Basal  inferior akinesis. Basal septal hypokinesis.   Systolic function was low normal to mildly reduced. The estimated   ejection fraction was in the range of 50% to 55%. Features are   consistent with a pseudonormal left ventricular filling pattern,   with concomitant abnormal relaxation and increased filling   pressure (grade 2 diastolic dysfunction). - Aortic valve: There was no stenosis. - Mitral valve: There was trivial regurgitation. - Right ventricle: The cavity size was normal. Systolic function   was normal. - Pulmonary arteries: No complete TR doppler jet so unable to   estimate PA systolic pressure. - Inferior vena cava: The vessel was normal in size. The   respirophasic diameter changes were in the normal range (>= 50%),   consistent with normal central venous pressure.  Impressions:  - Normal LV size with EF 50-55%. Wall motion abnormalities as noted   above. Normal RV size and systolic function. No significant   valvular abnormalities.  CATH: 02/08/2018  Acute coronary syndrome with difficult to identify culprit in the setting of multifocal disease involving the LAD, diagonal, and circumflex coronary arteries.  System delay occurred in achieving revascularization due to atypical symptoms on presentation and time spent deciding upon treatment strategy after anatomy was defined.  Successful ostial to proximal circumflex stent from 95% to 0% with TIMI grade III flow using a 2.25 x 15 Onyx postdilated to 2.75 mm in diameter..  The mid circumflex contains eccentric focal 70% stenosis.  Widely patent left main  LAD is large before branching into a very large first diagonal and the continuation of the LAD.  A proximal and mid stent are noted in the LAD followed by an extremely long stent in the mid to distal LAD.  In the mid LAD which was not stented there is 75% stenosis.  The diagonal contains new concentric 70% ostial narrowing and a mid vessel stent with distal stent margin  ISR up to 50%.  Beyond the stent there is segmental 80% stenosis.  Mild anteroapical hypokinesis.  EF 50%.  LVEDP was less than 10.  RECOMMENDATIONS:   Patient should probably have a surgical consultation to see if her targets would be adequate for surgical revascularization if symptoms continue.  If symptoms resolve, medical therapy for now and aggressive risk factor modification.  Discussed with team member, Dr. Quay Burow, who will be caring for the patient later today. Intervention     Laboratory Data:  Chemistry Recent Labs  Lab 10/29/18 1128 10/29/18 1204  NA 136 136  K 3.5 3.5  CL 102  --   CO2 17*  --   GLUCOSE 237*  --   BUN 24*  --   CREATININE 1.15*  --   CALCIUM 9.0  --   GFRNONAA 56*  --   GFRAA >60  --   ANIONGAP 17*  --     No results for input(s): PROT, ALBUMIN, AST, ALT, ALKPHOS, BILITOT in the last 168 hours. Hematology Recent Labs  Lab 10/29/18 1128 10/29/18 1204  WBC 9.6  --   RBC 4.43  --   HGB 13.4 13.9  HCT 39.5 41.0  MCV 89.2  --   MCH 30.2  --   MCHC 33.9  --   RDW 13.2  --   PLT 430*  --    Cardiac Enzymes High Sensitivity Troponin:   Recent Labs  Lab 10/29/18 1128  TROPONINIHS  1,138*      Lab Results  Component Value Date   HGBA1C 9.7 (A) 10/02/2018   Lab Results  Component Value Date   CHOL 215 (H) 07/23/2018   HDL 63 07/23/2018   LDLCALC 121 (H) 07/23/2018   TRIG 157 (H) 07/23/2018   CHOLHDL 3.4 07/23/2018     Radiology/Studies:  Dg Chest Port 1 View  Result Date: 10/29/2018 CLINICAL DATA:  Central chest pain for several days EXAM: PORTABLE CHEST 1 VIEW COMPARISON:  05/24/2018 FINDINGS: The heart size and mediastinal contours are within normal limits. Both lungs are clear. The visualized skeletal structures are unremarkable. IMPRESSION: No active disease. Electronically Signed   By: Inez Catalina M.D.   On: 10/29/2018 11:56    Assessment and Plan:   1. NSTEMI - sx have atypical components, but trop  is elevated and ECG is abnormal - BP is improving with IVF - although BUN/Cr are higher than normal for her, but not as high as in May 2020. Cr 1.15 is not very high and she is getting hydration. - with ongoing BP problems, she seems to be going into cardiogenic shock. - will add levophed if needed till BP stabilizes, continue IVF as well. - she is being swabbed for COVID, but will need to get her to the lab, ASAP.  - she rec'd ASA 81 mg x 4  2. DM: - continue insulin pump at baseline, add SSI  3. HLD, goal LDL < 70 - ck profile.  4. Hypotension - IVF for now, prn Levophed   Principal Problem:   NSTEMI (non-ST elevated myocardial infarction) ([place]) Active Problems:   Type 1 diabetes mellitus ([place])   Hyperlipidemia LDL goal <70   Hypotension   For questions or updates, please contact [facility] Please consult [email or web address] for contact info under Cardiology/STEMI.    Jonetta Speak, PA-C  10/29/2018 2:37 PM

## 2018-10-29 NOTE — ED Provider Notes (Signed)
[facility] EMERGENCY DEPARTMENT Provider Note   CSN: [id number] Arrival date & time: 10/29/18  1117     History   Chief Complaint Chief Complaint  Patient presents with  . Chest Pain    HPI Jennifer Hogan is a 48 y.o. female.  She has a history of coronary disease and had a stent in 2019.  She is also diabetic with a history of DKA and gastroparesis.  She said she started vomiting a few days ago multiple times and since then has had ongoing nausea and burning central chest pain.  She is not sure if this chest pain is similar to her cardiac event.  She was given aspirin and nitro by EMS without any improvement.  Tachycardic on arrival.     The history is provided by the patient.  Chest Pain Pain location:  Substernal area Pain quality: burning   Pain radiates to:  Epigastrium Pain severity:  Severe Onset quality:  Gradual Timing:  Constant Progression:  Unchanged Chronicity:  New Context comment:  Vomiting Relieved by:  None tried Worsened by:  Nothing Ineffective treatments:  None tried Associated symptoms: abdominal pain, fatigue, nausea and vomiting   Associated symptoms: no back pain, no cough, no diaphoresis, no fever, no headache and no shortness of breath   Risk factors: coronary artery disease, diabetes mellitus and high cholesterol     Past Medical History:  Diagnosis Date  . Anemia   . Anxiety   . Coronary artery disease    a. s/pPTCA DES x3 in the LAD (ostial DES placed, mid DES placed, distal DES placed)and PTCA/DES x1to intermediate branch 02/2017. b. ACS 02/08/18 with LCx stent placement.  . Depression   . Diabetes mellitus    type 1, on insulin pump  . DKA (diabetic ketoacidoses) ([place]) 12/31/2017  . Elevated platelet count   . High cholesterol   . Hypothyroidism   . Ischemic cardiomyopathy    a. EF low-normal with basal inferior akinesis, basal septal hypokinesis by echo 01/2018.    Patient Active Problem List   Diagnosis  Date Noted  . DKA, type 1 ([place]) 05/24/2018  . Moderate episode of recurrent major depressive disorder ([place]) 03/27/2018  . Epigastric pain   . Hyperglycemia due to type 1 diabetes mellitus ([place]) 02/14/2018  . Hyperlipidemia 02/14/2018  . S/P drug eluting coronary stent placement   . ACS (acute coronary syndrome) ([place]) 02/08/2018  . ST elevation myocardial infarction (STEMI) ([place]) 02/08/2018  . DKA (diabetic ketoacidosis) ([place]) 12/31/2017  . Marijuana abuse 12/31/2017  . Gastroparesis 12/06/2017  . Gastroparesis due to DM ([place]) 03/28/2017  . Coronary artery disease 03/28/2017  . Ischemic cardiomyopathy 03/28/2017  . Pure hypercholesterolemia   . NSTEMI (non-ST elevated myocardial infarction) ([place])   . Anemia 03/17/2017  . Allergic state 08/07/2015  . Vitiligo 07/09/2012  . Type 1 diabetes mellitus ([place]) 07/09/2012  . Depression 12/04/2010  . Non-intractable vomiting with nausea 12/04/2010  . Hypothyroidism 12/04/2010    Past Surgical History:  Procedure Laterality Date  . APPENDECTOMY    . CORONARY STENT INTERVENTION N/A 03/20/2017   Procedure: DES x 3 LAD, DES OM; Surgeon: Burnell Blanks, MD;  Location: [facility];  Service: Cardiovascular;  Laterality: N/A;  . CORONARY/GRAFT ACUTE MI REVASCULARIZATION N/A 02/08/2018   Procedure: Coronary/Graft Acute MI Revascularization;  Surgeon: Belva Crome, MD;  Location: [facility];  Service: Cardiovascular;  Laterality: N/A;  . LEFT HEART CATH AND CORONARY ANGIOGRAPHY N/A 03/20/2017  Procedure: LEFT HEART CATH AND CORONARY ANGIOGRAPHY;  Surgeon: Burnell Blanks, MD;  Location: [facility];  Service: Cardiovascular;  Laterality: N/A;  . LEFT HEART CATH AND CORONARY ANGIOGRAPHY N/A 02/08/2018   Procedure: LEFT HEART CATH AND CORONARY ANGIOGRAPHY;  Surgeon: Belva Crome, MD;  Location: [facility];  Service: Cardiovascular;  Laterality: N/A;     OB History    Gravida  0   Para  0   Term   0   Preterm  0   AB  0   Living  0     SAB  0   TAB  0   Ectopic  0   Multiple  0   Live Births  0            Home Medications    Prior to Admission medications   Medication Sig Start Date End Date Taking? Authorizing Provider  albuterol (PROVENTIL HFA;VENTOLIN HFA) 108 (90 Base) MCG/ACT inhaler Inhale 2 puffs into the lungs every 6 (six) hours as needed for wheezing or shortness of breath. 12/07/17 03/04/18  Damita Lack, MD  aspirin 81 MG EC tablet Take 1 tablet (81 mg total) by mouth daily. 12/07/17   Damita Lack, MD  clopidogrel (PLAVIX) 75 MG tablet Take 1 tablet (75 mg total) by mouth daily with breakfast. 02/12/18   Kathyrn Drown D, NP  FLUoxetine (PROZAC) 40 MG capsule Take 1 capsule (40 mg total) by mouth daily. 12/07/17 05/27/18  Amin, Jeanella Flattery, MD  insulin aspart (NOVOLOG) 100 UNIT/ML injection 35 units per insulin pump daily Patient taking differently: Inject 35 Units into the skin daily. insulin pump daily 10/31/17   Renato Shin, MD  ipratropium (ATROVENT) 0.06 % nasal spray Place 2 sprays into both nostrils 4 (four) times daily. Patient taking differently: Place 2 sprays into both nostrils 2 (two) times a day.  02/27/18   Tasia Catchings, Amy V, PA-C  isosorbide mononitrate (IMDUR) 30 MG 24 hr tablet Take 0.5 tablets (15 mg total) by mouth daily. Patient taking differently: Take 15 mg by mouth daily as needed (chest tightness).  02/11/18   Kathyrn Drown D, NP  levothyroxine (EUTHYROX) 100 MCG tablet Take 1 tablet (100 mcg total) by mouth daily before breakfast. Must have office visit for refills 10/23/18   Charlott Rakes, MD  metoprolol tartrate (LOPRESSOR) 25 MG tablet Take 1 tablet (25 mg total) by mouth 2 (two) times daily. Hold for SBP<90 and HR<50 02/11/18   Kathyrn Drown D, NP  montelukast (SINGULAIR) 10 MG tablet TAKE 1 TABLET BY MOUTH AT BEDTIME 09/16/18   [name], NP  nitroGLYCERIN (NITROSTAT) 0.4 MG SL tablet Place 1 tablet (0.4 mg total)  under the tongue every 5 (five) minutes as needed for chest pain. 12/07/17   Amin, Jeanella Flattery, MD  pantoprazole (PROTONIX) 40 MG tablet Take 1 tablet (40 mg total) by mouth 2 (two) times daily. 03/27/18 08/26/18  [name], NP  rosuvastatin (CRESTOR) 40 MG tablet Take 1 tablet (40 mg total) by mouth daily at 6 PM. 02/11/18   Tommie Raymond, NP  sucralfate (CARAFATE) 1 GM/10ML suspension Take 10 mLs (1 g total) by mouth 2 (two) times daily for 30 days. 05/30/18 06/29/18  [name], NP    Family History Family History  Problem Relation Age of Onset  . Coronary artery disease Father   . Congestive Heart Failure Father   . Asthma Father   . Cancer Mother  T cell lymphoma    Social History Social History   Tobacco Use  . Smoking status: Never Smoker  . Smokeless tobacco: Never Used  . Tobacco comment: Use marijuana  Substance Use Topics  . Alcohol use: No    Alcohol/week: 0.0 standard drinks  . Drug use: Yes    Types: Marijuana    Comment: last use last week, occaisonal marijuana use     Allergies   Ciprofloxin hcl [ciprofloxacin] and Food color red [red dye]   Review of Systems Review of Systems  Constitutional: Positive for chills and fatigue. Negative for diaphoresis and fever.  HENT: Negative for sore throat.   Eyes: Negative for visual disturbance.  Respiratory: Negative for cough and shortness of breath.   Cardiovascular: Positive for chest pain.  Gastrointestinal: Positive for abdominal pain, nausea and vomiting.  Genitourinary: Negative for dysuria.  Musculoskeletal: Negative for back pain.  Skin: Negative for rash.  Neurological: Negative for headaches.     Physical Exam Updated Vital Signs BP (!) 146/92   Pulse (!) 113   Temp 98.3 F (36.8 C) (Oral)   Resp 17   SpO2 99%   Physical Exam Vitals signs and nursing note reviewed.  Constitutional:      General: She is not in acute distress.    Appearance: She is well-developed.  HENT:      Head: Normocephalic and atraumatic.  Eyes:     Conjunctiva/sclera: Conjunctivae normal.  Neck:     Musculoskeletal: Neck supple.  Cardiovascular:     Rate and Rhythm: Regular rhythm. Tachycardia present.     Heart sounds: No murmur.  Pulmonary:     Effort: Pulmonary effort is normal. No respiratory distress.     Breath sounds: Normal breath sounds.  Abdominal:     Palpations: Abdomen is soft.     Tenderness: There is abdominal tenderness (diffuse). There is no guarding or rebound.  Musculoskeletal: Normal range of motion.     Right lower leg: She exhibits no tenderness.     Left lower leg: She exhibits no tenderness.  Skin:    General: Skin is warm and dry.     Capillary Refill: Capillary refill takes less than 2 seconds.  Neurological:     General: No focal deficit present.     Mental Status: She is alert.      ED Treatments / Results  Labs (all labs ordered are listed, but only abnormal results are displayed) Labs Reviewed  BASIC METABOLIC PANEL - Abnormal; Notable for the following components:      Result Value   CO2 17 (*)    Glucose, Bld 237 (*)    BUN 24 (*)    Creatinine, Ser 1.15 (*)    GFR calc non Af Amer 56 (*)    Anion gap 17 (*)    All other components within normal limits  CBC - Abnormal; Notable for the following components:   Platelets 430 (*)    All other components within normal limits  CBG MONITORING, ED - Abnormal; Notable for the following components:   Glucose-Capillary 212 (*)    All other components within normal limits  POCT I-STAT EG7 - Abnormal; Notable for the following components:   pCO2, Ven 27.9 (*)    Bicarbonate 17.6 (*)    TCO2 18 (*)    Acid-base deficit 6.0 (*)    Calcium, Ion 1.12 (*)    All other components within normal limits  TROPONIN I (HIGH SENSITIVITY) - Abnormal; Notable  for the following components:   Troponin I (High Sensitivity) 1,138 (*)    All other components within normal limits  TROPONIN I (HIGH SENSITIVITY)  - Abnormal; Notable for the following components:   Troponin I (High Sensitivity) 1,132 (*)    All other components within normal limits  SARS CORONAVIRUS 2 (HOSPITAL ORDER, [facility])  PROTIME-INR  CBC    EKG EKG Interpretation  Date/Time:  Tuesday October 29 2018 11:21:58 EDT Ventricular Rate:  112 PR Interval:    QRS Duration: 94 QT Interval:  350 QTC Calculation: 478 R Axis:   32 Text Interpretation:  Sinus tachycardia LVH with secondary repolarization abnormality Anterior infarct, old new lateral changes compared with prior 5/20 Confirmed by Aletta Edouard [phone]) on 10/29/2018 11:27:25 AM   Radiology Dg Chest Port 1 View  Result Date: 10/29/2018 CLINICAL DATA:  Central chest pain for several days EXAM: PORTABLE CHEST 1 VIEW COMPARISON:  05/24/2018 FINDINGS: The heart size and mediastinal contours are within normal limits. Both lungs are clear. The visualized skeletal structures are unremarkable. IMPRESSION: No active disease. Electronically Signed   By: Inez Catalina M.D.   On: 10/29/2018 11:56    Procedures .Critical Care Performed by: Hayden Rasmussen, MD Authorized by: Hayden Rasmussen, MD   Critical care provider statement:    Critical care time (minutes):  45   Critical care time was exclusive of:  Separately billable procedures and treating other patients   Critical care was necessary to treat or prevent imminent or life-threatening deterioration of the following conditions:  Cardiac failure   Critical care was time spent personally by me on the following activities:  Discussions with consultants, evaluation of patient's response to treatment, examination of patient, ordering and performing treatments and interventions, ordering and review of laboratory studies, ordering and review of radiographic studies, pulse oximetry, re-evaluation of patient's condition, obtaining history from patient or surrogate, review of old charts and development  of treatment plan with patient or surrogate   (including critical care time)  Medications Ordered in ED Medications  morphine 4 MG/ML injection 4 mg (has no administration in time range)  ondansetron (ZOFRAN) injection 4 mg (has no administration in time range)  famotidine (PEPCID) IVPB 20 mg premix (has no administration in time range)  sodium chloride 0.9 % bolus 1,000 mL (has no administration in time range)     Initial Impression / Assessment and Plan / ED Course  I have reviewed the triage vital signs and the nursing notes.  Pertinent labs & imaging results that were available during my care of the patient were reviewed by me and considered in my medical decision making (see chart for details).  Clinical Course as of Oct 28 1700  Tue Oct 29, 8058  579 48 year old female here with 3 to 4 days of chest pain in the setting of frequent vomiting.  History of cardiac disease with a stent.  She is tachycardic here afebrile and normal pulse ox.  Given aspirin already.  Differential includes ACS, DKA, gastritis/GERD, Boerhaave's   [MB]  1322 Patient's troponin come back markedly elevated.  She does have a little bit of a gap so giving her some fluids.  Paged for cardiology consult.   [MB]  [id number] I updated the patient on her results.  She said she feels much better after medication.  Repeated EKG does not show any improvement.  Awaiting cardiology input.   [MB]    Clinical Course User Index [MB] Melina Copa,  Rebeca Alert, MD      Discussion with cards. Heparin gtt and discussion for cath. covid testing ordered.   Jennifer Hogan was evaluated in Emergency Department on 10/29/2018 for the symptoms described in the history of present illness. She was evaluated in the context of the global COVID-19 pandemic, which necessitated consideration that the patient might be at risk for infection with the SARS-CoV-2 virus that causes COVID-19. Institutional protocols and algorithms that pertain to the  evaluation of patients at risk for COVID-19 are in a state of rapid change based on information released by regulatory bodies including the CDC and federal and state organizations. These policies and algorithms were followed during the patient's care in the ED.   Final Clinical Impressions(s) / ED Diagnoses   Final diagnoses:  Non-ST elevation (NSTEMI) myocardial infarction [facility])    ED Discharge Orders    None       Hayden Rasmussen, MD 10/29/18 1705

## 2018-10-29 NOTE — Interval H&P Note (Signed)
History and Physical Interval Note:  10/29/2018 3:23 PM  Jennifer Hogan  has presented today for surgery, with the diagnosis of NSTEMI.  The various methods of treatment have been discussed with the patient and family. After consideration of risks, benefits and other options for treatment, the patient has consented to  Procedure(s): LEFT HEART CATH AND CORONARY ANGIOGRAPHY (N/A) as a surgical intervention.  The patient's history has been reviewed, patient examined, no change in status, stable for surgery.  I have reviewed the patient's chart and labs.  Questions were answered to the patient's satisfaction.    Cath Lab Visit (complete for each Cath Lab visit)  Clinical Evaluation Leading to the Procedure:   ACS: Yes.    Non-ACS:    Anginal Classification: CCS III  Anti-ischemic medical therapy: Maximal Therapy (2 or more classes of medications)  Non-Invasive Test Results: No non-invasive testing performed  Prior CABG: No previous CABG         Lauree Chandler

## 2018-10-29 NOTE — ED Notes (Signed)
Date and time results received: 10/29/18 1311 (use smartphrase ".now" to insert current time) Test: trop Critical Value: 1137  Name of Provider Notified: Dr. Melina Copa  Orders Received? Or Actions Taken?: .

## 2018-10-29 NOTE — Progress Notes (Signed)
ANTICOAGULATION CONSULT NOTE - Initial Consult  Pharmacy Consult for Heparin Indication: chest pain/ACS  Allergies  Allergen Reactions  . Ciprofloxin Hcl [Ciprofloxacin] Nausea And Vomiting and Other (See Comments)    Severe migraine  . Food Color Red [Red Dye] Diarrhea    Patient Measurements: Height: 5\' 5"  (165.1 cm) Weight: 150 lb (68 kg) IBW/kg (Calculated) : 57 Heparin Dosing Weight: 68  Vital Signs: Temp: 98.3 F (36.8 C) (10/06 1128) Temp Source: Oral (10/06 1128) BP: 92/75 (10/06 1330) Pulse Rate: 104 (10/06 1330)  Labs: Recent Labs    10/29/18 1128 10/29/18 1204  HGB 13.4 13.9  HCT 39.5 41.0  PLT 430*  --   LABPROT 14.2  --   INR 1.1  --   CREATININE 1.15*  --   TROPONINIHS 1,138*  --     Estimated Creatinine Clearance: 53.2 mL/min (A) (by C-G formula based on SCr of 1.15 mg/dL (H)).   Medical History: Past Medical History:  Diagnosis Date  . Anemia   . Anxiety   . Coronary artery disease    a. s/pPTCA DES x3 in the LAD (ostial DES placed, mid DES placed, distal DES placed)and PTCA/DES x1to intermediate branch 02/2017. b. ACS 02/08/18 with LCx stent placement.  . Depression   . Diabetes mellitus    type 1, on insulin pump  . DKA (diabetic ketoacidoses) ([place]) 12/31/2017  . Elevated platelet count   . High cholesterol   . Hypothyroidism   . Ischemic cardiomyopathy    a. EF low-normal with basal inferior akinesis, basal septal hypokinesis by echo 01/2018.     Assessment: 48 yo female with a history of CAD and stent placement. Came in with heart pounding was the worst on Monday. She had chest aching on Sunday, 5-6/10. Worse w/ deep inspiration. Tried 1/2 tab isosorbide, no help. Could not find SL NTG. Took metoprolol but BP was low. Monday, the pounding was worse and she had chest pain that started in the middle and spread. Felt like a burning pain, 8/10. Did not feel like any meds helped. She was hurting all over. Tried a hydrocodone, no help, threw  it up. Felt a little better if she pressed on her chest. Today, she was extremely weak and came to the ER.   Pharmacy is consulted to dose heparin for ACS/STEMI. Trop 1138.  Patient was not on any anticoagulants PTA other than aspirin and Plavix.  Goal of Therapy:  Heparin level 0.3-0.7 units/ml Monitor platelets by anticoagulation protocol: Yes   Plan:  Give 4000 units bolus x 1 Start heparin infusion at 800 units/hr Check anti-Xa level in 6 - 8 hours and daily while on heparin Continue to monitor H&H and platelets  Corinda Gubler 10/29/2018,2:35 PM

## 2018-10-29 NOTE — Progress Notes (Signed)
TR BAND REMOVAL  LOCATION:   Right radial  DEFLATED PER PROTOCOL:    Yes.    TIME BAND OFF / DRESSING APPLIED:    1850p a clean dressing applied with guaze and tegaderm   SITE UPON ARRIVAL:    Level 0  SITE AFTER BAND REMOVAL:    Level 0  CIRCULATION SENSATION AND MOVEMENT:    Within Normal Limits   Yes.    COMMENTS:   Pt able to move ext without any problem at site.

## 2018-10-29 NOTE — ED Triage Notes (Signed)
Pt here w/co central chest pain x3 days that is a burning feeling. Non radiating. 7/10. Vomiting x2 days. Hx of cardiac probs with stent placement. Was given 324 aspirin and 0.4 nitro with no relief. Pt was given 572ml of fl.

## 2018-10-30 ENCOUNTER — Inpatient Hospital Stay (HOSPITAL_COMMUNITY): Payer: Self-pay

## 2018-10-30 ENCOUNTER — Encounter (HOSPITAL_COMMUNITY): Payer: Self-pay | Admitting: Cardiovascular Disease

## 2018-10-30 DIAGNOSIS — I340 Nonrheumatic mitral (valve) insufficiency: Secondary | ICD-10-CM

## 2018-10-30 DIAGNOSIS — I361 Nonrheumatic tricuspid (valve) insufficiency: Secondary | ICD-10-CM

## 2018-10-30 DIAGNOSIS — I25110 Atherosclerotic heart disease of native coronary artery with unstable angina pectoris: Secondary | ICD-10-CM

## 2018-10-30 LAB — CBC
HCT: 30 % — ABNORMAL LOW (ref 36.0–46.0)
Hemoglobin: 10.3 g/dL — ABNORMAL LOW (ref 12.0–15.0)
MCH: 31.2 pg (ref 26.0–34.0)
MCHC: 34.3 g/dL (ref 30.0–36.0)
MCV: 90.9 fL (ref 80.0–100.0)
Platelets: 265 10*3/uL (ref 150–400)
RBC: 3.3 MIL/uL — ABNORMAL LOW (ref 3.87–5.11)
RDW: 13.6 % (ref 11.5–15.5)
WBC: 6.9 10*3/uL (ref 4.0–10.5)
nRBC: 0 % (ref 0.0–0.2)

## 2018-10-30 LAB — ECHOCARDIOGRAM COMPLETE
Height: 65 in
Weight: 2400 oz

## 2018-10-30 LAB — GLUCOSE, CAPILLARY
Glucose-Capillary: 118 mg/dL — ABNORMAL HIGH (ref 70–99)
Glucose-Capillary: 85 mg/dL (ref 70–99)
Glucose-Capillary: 347 mg/dL — ABNORMAL HIGH (ref 70–99)
Glucose-Capillary: 174 mg/dL — ABNORMAL HIGH (ref 70–99)
Glucose-Capillary: 71 mg/dL (ref 70–99)

## 2018-10-30 LAB — HEPARIN LEVEL (UNFRACTIONATED)
Heparin Unfractionated: 0.22 IU/mL — ABNORMAL LOW (ref 0.30–0.70)
Heparin Unfractionated: 0.5 IU/mL (ref 0.30–0.70)

## 2018-10-30 MED ORDER — INSULIN PUMP
Freq: Three times a day (TID) | SUBCUTANEOUS | Status: DC
  Administered 2018-10-30 (×2): via SUBCUTANEOUS
  Administered 2018-10-31: 3.4 via SUBCUTANEOUS
  Administered 2018-10-31: 22:00:00 1.2 via SUBCUTANEOUS
  Administered 2018-10-31: 12:00:00 5.6 via SUBCUTANEOUS
  Administered 2018-10-31: 10:00:00 0.8 via SUBCUTANEOUS
  Administered 2018-10-31: 3 via SUBCUTANEOUS
  Administered 2018-11-01 – 2018-11-03 (×7): via SUBCUTANEOUS
  Filled 2018-10-30: qty 1

## 2018-10-30 MED ORDER — LEVOTHYROXINE SODIUM 100 MCG PO TABS
100.0000 ug | ORAL_TABLET | Freq: Every day | ORAL | Status: DC
  Administered 2018-10-31 – 2018-11-09 (×10): 100 ug via ORAL
  Filled 2018-10-30 (×10): qty 1

## 2018-10-30 MED ORDER — PANTOPRAZOLE SODIUM 40 MG PO TBEC
40.0000 mg | DELAYED_RELEASE_TABLET | Freq: Every day | ORAL | Status: DC
  Administered 2018-10-30 – 2018-11-03 (×5): 40 mg via ORAL
  Filled 2018-10-30 (×5): qty 1

## 2018-10-30 MED ORDER — ROSUVASTATIN CALCIUM 20 MG PO TABS
40.0000 mg | ORAL_TABLET | Freq: Every day | ORAL | Status: DC
  Administered 2018-10-30: 19:00:00 40 mg via ORAL
  Filled 2018-10-30 (×3): qty 2

## 2018-10-30 MED ORDER — ASPIRIN EC 81 MG PO TBEC
81.0000 mg | DELAYED_RELEASE_TABLET | Freq: Every day | ORAL | Status: DC
  Administered 2018-10-30 – 2018-11-03 (×5): 81 mg via ORAL
  Filled 2018-10-30 (×5): qty 1

## 2018-10-30 MED ORDER — MONTELUKAST SODIUM 10 MG PO TABS
10.0000 mg | ORAL_TABLET | Freq: Every day | ORAL | Status: DC
  Administered 2018-10-31 – 2018-11-08 (×10): 10 mg via ORAL
  Filled 2018-10-30 (×11): qty 1

## 2018-10-30 MED ORDER — SODIUM CHLORIDE 0.9 % IV BOLUS
500.0000 mL | Freq: Once | INTRAVENOUS | Status: AC
  Administered 2018-10-30: 03:00:00 500 mL via INTRAVENOUS

## 2018-10-30 MED ORDER — FLUOXETINE HCL 20 MG PO CAPS
40.0000 mg | ORAL_CAPSULE | Freq: Every day | ORAL | Status: DC
  Administered 2018-10-30 – 2018-11-09 (×10): 40 mg via ORAL
  Filled 2018-10-30 (×11): qty 2

## 2018-10-30 NOTE — Progress Notes (Signed)
Inpatient Diabetes Program Recommendations  AACE/ADA: New Consensus Statement on Inpatient Glycemic Control (2015)  Target Ranges:  Prepandial:   less than 140 mg/dL      Peak postprandial:   less than 180 mg/dL (1-2 hours)      Critically ill patients:  140 - 180 mg/dL   Lab Results  Component Value Date   GLUCAP 347 (H) 10/30/2018   HGBA1C 9.7 (A) 10/02/2018    Review of Glycemic Control Results for Jennifer Hogan, Jennifer Hogan (MRN [id number]) as of 10/30/2018 11:56  Ref. Range 10/30/2018 07:08 10/30/2018 11:03  Glucose-Capillary Latest Ref Range: 70 - 99 mg/dL 85 347 (H)   Spoke with patient regarding insulin pump settings.  She states that Dr. Loanne Drilling changed her settings upon last visit, however she only changed temporarily due to concerns regarding potential lows with increased basal rates during the day.   Her current pump settings are: Basal Rate- 0.675 units/hr 24 hours per day Total basal per 24 hour period- 16.2 units Carb Ratio- 1 unit for every 17 grams Carbohydrates Correction Ratio- 1 unit for every 60 mg/dl above Target CBG Target CBG 100 mg/dl  She does not have insulin pump supplies and is in need of them. She states that she does not have anyone who can go to her house.  Told her I would check to see if any pump supplies available in- house.  She is due to change insulin pump site today.  She states that her blood sugar went up today b/c she did not bolus enough for breakfast.  Explained that she would go on insulin drip during surgery and then transition back to insulin pump when alert and oriented.  Patient states that she is uninsured but is able to get supplies from pump company and insulin from [facility].   Thanks,  Adah Perl, RN, BC-ADM Inpatient Diabetes Coordinator Pager [phone] (8a-5p)

## 2018-10-30 NOTE — Progress Notes (Addendum)
Progress Note  Patient Name: Jennifer Hogan Date of Encounter: 10/30/2018  Primary Cardiologist: Fransico Him, MD   Subjective   Feels much better today. No chest pain or dyspnea. Nausea much better. Has noted no blood in stools.   Inpatient Medications    Scheduled Meds: . Chlorhexidine Gluconate Cloth  6 each Topical Daily   Continuous Infusions: . heparin 800 Units/hr (10/30/18 0600)   PRN Meds:    Vital Signs    Vitals:   10/30/18 0400 10/30/18 0500 10/30/18 0600 10/30/18 0710  BP: (!) 83/58 91/65 (!) 78/49   Pulse: 68 72 69   Resp: 17 15 18    Temp:    98.2 F (36.8 C)  TempSrc:    Oral  SpO2: 95% 95% 96%   Weight:      Height:        Intake/Output Summary (Last 24 hours) at 10/30/2018 0744 Last data filed at 10/30/2018 0600 Gross per 24 hour  Intake 2326.31 ml  Output 500 ml  Net 1826.31 ml   Last 3 Weights 10/29/2018 10/29/2018 10/02/2018  Weight (lbs) 150 lb 150 lb 152 lb 12.8 oz  Weight (kg) 68.04 kg 68.04 kg 69.31 kg      Telemetry    NSR - Personally Reviewed  ECG    None yet today - Personally Reviewed  Physical Exam   GEN: No acute distress.   Neck: No JVD Cardiac: RRR, no murmurs, rubs, or gallops.  Respiratory: Clear to auscultation bilaterally. GI: Soft, nontender, non-distended  MS: No edema; No deformity. Right radial site without hematoma. Neuro:  Nonfocal  Psych: Normal affect   Labs    High Sensitivity Troponin:   Recent Labs  Lab 10/29/18 1128 10/29/18 1430  TROPONINIHS 1,138* 1,132*      Chemistry Recent Labs  Lab 10/29/18 1128 10/29/18 1204  NA 136 136  K 3.5 3.5  CL 102  --   CO2 17*  --   GLUCOSE 237*  --   BUN 24*  --   CREATININE 1.15*  --   CALCIUM 9.0  --   GFRNONAA 56*  --   GFRAA >60  --   ANIONGAP 17*  --      Hematology Recent Labs  Lab 10/29/18 1128 10/29/18 1204 10/30/18 0647  WBC 9.6  --  6.9  RBC 4.43  --  3.30*  HGB 13.4 13.9 10.3*  HCT 39.5 41.0 30.0*  MCV 89.2  --  90.9   MCH 30.2  --  31.2  MCHC 33.9  --  34.3  RDW 13.2  --  13.6  PLT 430*  --  265    BNPNo results for input(s): BNP, PROBNP in the last 168 hours.   DDimer No results for input(s): DDIMER in the last 168 hours.   Radiology    Dg Chest Port 1 View  Result Date: 10/29/2018 CLINICAL DATA:  Central chest pain for several days EXAM: PORTABLE CHEST 1 VIEW COMPARISON:  05/24/2018 FINDINGS: The heart size and mediastinal contours are within normal limits. Both lungs are clear. The visualized skeletal structures are unremarkable. IMPRESSION: No active disease. Electronically Signed   By: Inez Catalina M.D.   On: 10/29/2018 11:56    Cardiac Studies    Ost Cx to Prox Cx lesion is 99% stenosed.  Ramus-2 lesion is 20% stenosed.  Ramus-1 lesion is 60% stenosed.  Ramus-3 lesion is 70% stenosed.  Prox LAD to Mid LAD lesion is 10% stenosed.  Previously placed  Mid LAD to Dist LAD stent (unknown type) is widely patent.  Dist LAD-1 lesion is 99% stenosed.  Dist LAD-2 lesion is 99% stenosed.  There is mild to moderate left ventricular systolic dysfunction.  LV end diastolic pressure is normal.  The left ventricular ejection fraction is 35-45% by visual estimate.  There is no mitral valve regurgitation.  Mid LAD lesion is 60% stenosed.  Prox Cx to Mid Cx lesion is 70% stenosed.   1. Severe double vessel CAD 2. The LAD has a patent ostial/proximal stented segment with no restenosis. The distal stented segment has no restenosis. The mid vessel between stented segments has a moderately severe, eccentric stenosis. The small caliber, apical LAD has diffuse severe serial stenoses.  3. The intermediate branch (early diagonal) has a moderately severe ostial/proximal stenosis followed by a patent mid stented segment. The stented segment has moderate restenosis.  Just beyond the stented segment there is a severe stenosis.  4. The Circumflex has an ostial/proximal stent that has severe restenosis  within the stent. The mid Circumflex has a moderately severe stenosis.  5. The RCA is a large dominant artery with no obstructive disease 6. Moderate segmental LV systolic dysfunction. LVEF around 40%.   Recommendations: I have reviewed her films today with Dr. Martinique. Her culprit lesion is likely the ostial Circumflex stented segment that shows severe stent restenosis. Her disease has progressed in the intermediate branch and the LAD. She is a diabetic and given the aggressive nature of her disease with evidence of stent restenosis in a short period of time, I think we should consider bypass surgery. We will resume heparin 8 hours after the sheath pull. Will admit to the ICU. Will ask CT surgery to see her to discuss CABG. Plavix will be held.    Patient Profile     48 y.o. female with type 1 IDDM, HLD and CAD s/p multiple prior stents in the last 2 years presents with NSTEMI   Assessment & Plan    1. NSTEMI. Ecg with ST/T depression in lateral leads that is new. Troponin up to 1138. Based on cath data she has critical restenosis in stent of ostial LCx. She also has significant stenosis in the ramus intermediate vessel and 60% mid LAD. Distal LAD disease is not suitable for revascularization. Surgical consultation in progress. Plavix on hold. Will continue IV heparin. Due to low BP will hold beta blocker/nitrates. Echo today. Will transfer to telemetry today and advance activity with cardiac Rehab.  2. DM type 1 on insulin. Glucose controlled. 3. Anemia. No clear bleeding. Will give Protonix prophylaxis. Monitor Hgb on IV heparin. Heme check stool.  4. Hypercholesterolemia. On high dose Crestor.  5. Ischemic cardiomyopathy. EF 40% by cath. Echo pending. Unable to initiate CHF meds due to hypotension. No overtly in failure.   For questions or updates, please contact [place] Please consult [email or web address] for contact info under        Signed, Peter Martinique, MD  10/30/2018, 7:44 AM

## 2018-10-30 NOTE — Telephone Encounter (Signed)
Please call pt to reschedule appt

## 2018-10-30 NOTE — Progress Notes (Signed)
Echocardiogram 2D Echocardiogram has been performed.  Oneal Deputy Osborne Serio 10/30/2018, 8:20 AM

## 2018-10-30 NOTE — Consult Note (Signed)
TCTS I briefly met the patient last night and discussed in brief the findings of LHC performed 10/29/18. She is obviously a great candidate for CABG; full report to follow. Presently, will target next Monday for surgery due to Plavix.  Will discuss details of surgery in more in-depth fashion this morning. Iolanda Folson Z. Orvan Seen, [place]

## 2018-10-30 NOTE — Progress Notes (Signed)
ANTICOAGULATION CONSULT NOTE - Initial Consult  Pharmacy Consult for Heparin Indication: chest pain/ACS  Allergies  Allergen Reactions  . Ciprofloxin Hcl [Ciprofloxacin] Nausea And Vomiting and Other (See Comments)    Severe migraine  . Food Color Red [Red Dye] Diarrhea    Patient Measurements: Height: 5\' 5"  (165.1 cm) Weight: 150 lb (68 kg) IBW/kg (Calculated) : 57 Heparin Dosing Weight: 68  Vital Signs: Temp: 98.2 F (36.8 C) (10/07 0710) Temp Source: Oral (10/07 0710) BP: 81/51 (10/07 0800) Pulse Rate: 73 (10/07 0800)  Labs: Recent Labs    10/29/18 1128 10/29/18 1204 10/29/18 1430 10/30/18 0647  HGB 13.4 13.9  --  10.3*  HCT 39.5 41.0  --  30.0*  PLT 430*  --   --  265  LABPROT 14.2  --   --   --   INR 1.1  --   --   --   HEPARINUNFRC  --   --   --  0.22*  CREATININE 1.15*  --   --   --   TROPONINIHS 1,138*  --  1,132*  --     Estimated Creatinine Clearance: 53.2 mL/min (A) (by C-G formula based on SCr of 1.15 mg/dL (H)).   Medical History: Past Medical History:  Diagnosis Date  . Anemia   . Anxiety   . Coronary artery disease    a. s/pPTCA DES x3 in the LAD (ostial DES placed, mid DES placed, distal DES placed)and PTCA/DES x1to intermediate branch 02/2017. b. ACS 02/08/18 with LCx stent placement.  . Depression   . Diabetes mellitus    type 1, on insulin pump  . DKA (diabetic ketoacidoses) ([place]) 12/31/2017  . Elevated platelet count   . High cholesterol   . Hypothyroidism   . Ischemic cardiomyopathy    a. EF low-normal with basal inferior akinesis, basal septal hypokinesis by echo 01/2018.     Assessment: 48 yo female with a history of CAD and stent placement. Came in with heart pounding was the worst on Monday. She had chest aching on Sunday, 5-6/10. Worse w/ deep inspiration. Tried 1/2 tab isosorbide, no help. Could not find SL NTG. Took metoprolol but BP was low. Monday, the pounding was worse and she had chest pain that started in the middle  and spread. Felt like a burning pain, 8/10. Did not feel like any meds helped. She was hurting all over. Tried a hydrocodone, no help, threw it up. Felt a little better if she pressed on her chest. Today, she was extremely weak and came to the ER.   Pharmacy is consulted to dose heparin for ACS/NSTEMI. Trop 1138.  Patient was not on any anticoagulants PTA other than aspirin and Plavix. Plan is for CABG on Monday 10/12. Will need to hold Plavix for washout period.   Heparin level SUBtherapeutic this morning at 0.22 on 800 units/hr. D/t acute drop in CBC will not bolus, but will increase infusion rate by ~2 units/kg/hr.  CBC dropped since yesterday which could be dilutional since the pt has received fluids. Hgb 13.9>10.3 and plts 430>265. Will continue to monitor. No active bleeding or line issues per nurse.  Goal of Therapy:  Heparin level 0.3-0.7 units/ml Monitor platelets by anticoagulation protocol: Yes   Plan:  Increase heparin infusion to 950 units/hr Check heparin level in 8 hours and daily while on heparin Monitor for s/sx of bleeding Continue to monitor H&H and platelets  Kennon Holter, PharmD PGY1 [place] Resident [employer] Phone: [phone] 10/30/2018,9:33 AM

## 2018-10-30 NOTE — Progress Notes (Signed)
[facility] for Heparin Indication: chest pain/ACS  Allergies  Allergen Reactions  . Peanut-Containing Drug Products Other (See Comments)    Vomiting, upset stomach and some wheezing  . Ciprofloxin Hcl [Ciprofloxacin] Nausea And Vomiting and Other (See Comments)    Severe migraine  . Food Color Red [Red Dye] Diarrhea    Patient Measurements: Height: 5\' 5"  (165.1 cm) Weight: 150 lb (68 kg) IBW/kg (Calculated) : 57 Heparin Dosing Weight: 68  Vital Signs: Temp: 98.7 F (37.1 C) (10/07 1913) Temp Source: Oral (10/07 1913) BP: 118/79 (10/07 1800) Pulse Rate: 86 (10/07 1800)  Labs: Recent Labs    10/29/18 1128 10/29/18 1204 10/29/18 1430 10/30/18 0647 10/30/18 2147  HGB 13.4 13.9  --  10.3*  --   HCT 39.5 41.0  --  30.0*  --   PLT 430*  --   --  265  --   LABPROT 14.2  --   --   --   --   INR 1.1  --   --   --   --   HEPARINUNFRC  --   --   --  0.22* 0.50  CREATININE 1.15*  --   --   --   --   TROPONINIHS 1,138*  --  1,132*  --   --     Estimated Creatinine Clearance: 53.2 mL/min (A) (by C-G formula based on SCr of 1.15 mg/dL (H)).   Medical History: Past Medical History:  Diagnosis Date  . Anemia   . Anxiety   . Coronary artery disease    a. s/pPTCA DES x3 in the LAD (ostial DES placed, mid DES placed, distal DES placed)and PTCA/DES x1to intermediate branch 02/2017. b. ACS 02/08/18 with LCx stent placement.  . Depression   . Diabetes mellitus    type 1, on insulin pump  . DKA (diabetic ketoacidoses) ([place]) 12/31/2017  . Elevated platelet count   . High cholesterol   . Hypothyroidism   . Ischemic cardiomyopathy    a. EF low-normal with basal inferior akinesis, basal septal hypokinesis by echo 01/2018.     Assessment: 48 yo female with a history of CAD and stent placement. Came in with heart pounding was the worst on Monday. She had chest aching on Sunday, 5-6/10. Worse w/ deep inspiration. Tried 1/2 tab isosorbide, no  help. Could not find SL NTG. Took metoprolol but BP was low. Monday, the pounding was worse and she had chest pain that started in the middle and spread. Felt like a burning pain, 8/10. Did not feel like any meds helped. She was hurting all over. Tried a hydrocodone, no help, threw it up. Felt a little better if she pressed on her chest. Today, she was extremely weak and came to the ER.   Pharmacy is consulted to dose heparin for ACS/NSTEMI. Trop 1138.  Patient was not on any anticoagulants PTA other than aspirin and Plavix. Plan is for CABG on Monday 10/12. Will need to hold Plavix for washout period.  -heparin level is at goal  Goal of Therapy:  Heparin level 0.3-0.7 units/ml Monitor platelets by anticoagulation protocol: Yes   Plan:  -No heparin changes needed -Daily heparin level and CBC  Hildred Laser, PharmD Clinical Pharmacist **Pharmacist phone directory can now be found on [email or web address] ([email or web address]).  Listed under [place].

## 2018-10-30 NOTE — Consult Note (Signed)
[address]       [place] [address]             [phone]        Charlsie A Monsivais [place] Medical Record [id number] Date of Birth: October 29, 1970  Referring: No ref. provider found Primary Care: [name], NP Primary Cardiologist:Traci Radford Pax, MD  Chief Complaint:    Chief Complaint  Patient presents with  . Chest Pain    History of Present Illness:      48 yo lady with long-standing DM and strong FHx of CAD in mother and father presented with atypical angina sx again and demonstrated to have NSTEM. Her cardiac hx dates back approximately 18 months when she underwent 1st PCI which was required reintervention this past Winter. She had been doing reasonably well since then but does report she has never quite gotten back to "normal" in terms of overall energy level or exercise tolerance. Upon LHC, she is demonstrated to have diffuse disease of left coronary system; the RCA system is nondiseased. Her LV function is mildly depressed. Consult received for CABG.   Current Activity/ Functional Status: Patient is independent with mobility/ambulation, transfers, ADL's, IADL's.   Zubrod Score: At the time of surgery this patient's most appropriate activity status/level should be described as: []     0    Normal activity, no symptoms []     1    Restricted in physical strenuous activity but ambulatory, able to do out light work []     2    Ambulatory and capable of self care, unable to do work activities, up and about                 more than 50%  Of the time                            []     3    Only limited self care, in bed greater than 50% of waking hours []     4    Completely disabled, no self care, confined to bed or chair []     5    Moribund  Past Medical History:  Diagnosis Date  . Anemia   . Anxiety   . Coronary artery disease    a. s/pPTCA DES x3 in the LAD (ostial DES placed, mid DES placed, distal DES placed)and PTCA/DES x1to intermediate  branch 02/2017. b. ACS 02/08/18 with LCx stent placement.  . Depression   . Diabetes mellitus    type 1, on insulin pump  . DKA (diabetic ketoacidoses) ([place]) 12/31/2017  . Elevated platelet count   . High cholesterol   . Hypothyroidism   . Ischemic cardiomyopathy    a. EF low-normal with basal inferior akinesis, basal septal hypokinesis by echo 01/2018.    Past Surgical History:  Procedure Laterality Date  . APPENDECTOMY    . CORONARY STENT INTERVENTION N/A 03/20/2017   Procedure: DES x 3 LAD, DES OM; Surgeon: Burnell Blanks, MD;  Location: [facility];  Service: Cardiovascular;  Laterality: N/A;  . CORONARY/GRAFT ACUTE MI REVASCULARIZATION N/A 02/08/2018   Procedure: Coronary/Graft Acute MI Revascularization;  Surgeon: Belva Crome, MD;  Location: [facility];  Service: Cardiovascular;  Laterality: N/A;  . LEFT HEART CATH AND CORONARY ANGIOGRAPHY N/A 03/20/2017   Procedure: LEFT HEART CATH AND CORONARY ANGIOGRAPHY;  Surgeon: Burnell Blanks, MD;  Location: [facility];  Service: Cardiovascular;  Laterality: N/A;  . LEFT HEART CATH AND CORONARY ANGIOGRAPHY N/A 02/08/2018   Procedure: LEFT HEART CATH AND CORONARY ANGIOGRAPHY;  Surgeon: Belva Crome, MD;  Location: [facility];  Service: Cardiovascular;  Laterality: N/A;  . LEFT HEART CATH AND CORONARY ANGIOGRAPHY N/A 10/29/2018   Procedure: LEFT HEART CATH AND CORONARY ANGIOGRAPHY;  Surgeon: Burnell Blanks, MD;  Location: [facility];  Service: Cardiovascular;  Laterality: N/A;    Social History   Tobacco Use  Smoking Status Never Smoker  Smokeless Tobacco Never Used  Tobacco Comment   Use marijuana    Social History   Substance and Sexual Activity  Alcohol Use No  . Alcohol/week: 0.0 standard drinks     Allergies  Allergen Reactions  . Ciprofloxin Hcl [Ciprofloxacin] Nausea And Vomiting and Other (See Comments)    Severe migraine  . Food Color Red [Red Dye]  Diarrhea    Current Facility-Administered Medications  Medication Dose Route Frequency Provider Last Rate Last Dose  . Chlorhexidine Gluconate Cloth 2 % PADS 6 each  6 each Topical Daily Martinique, Peter M, MD   6 each at 10/29/18 2000  . heparin ADULT infusion 100 units/mL (25000 units/270mL sodium chloride 0.45%)  950 Units/hr Intravenous Continuous Beryle Lathe, RPH 9.5 mL/hr at 10/30/18 1007 950 Units/hr at 10/30/18 1007  . insulin pump   Subcutaneous TID AC, HS, 0200 Martinique, Peter M, MD      . pantoprazole (PROTONIX) EC tablet 40 mg  40 mg Oral Daily Martinique, Peter M, MD   40 mg at 10/30/18 [id number]    Medications Prior to Admission  Medication Sig Dispense Refill Last Dose  . albuterol (PROVENTIL HFA;VENTOLIN HFA) 108 (90 Base) MCG/ACT inhaler Inhale 2 puffs into the lungs every 6 (six) hours as needed for wheezing or shortness of breath. 1 Inhaler 0   . aspirin 81 MG EC tablet Take 1 tablet (81 mg total) by mouth daily. 90 tablet 0   . clopidogrel (PLAVIX) 75 MG tablet Take 1 tablet (75 mg total) by mouth daily with breakfast. 30 tablet 11   . FLUoxetine (PROZAC) 40 MG capsule Take 1 capsule (40 mg total) by mouth daily. 30 capsule 0   . insulin aspart (NOVOLOG) 100 UNIT/ML injection 35 units per insulin pump daily (Patient taking differently: Inject 35 Units into the skin daily. insulin pump daily) 10 mL 11   . ipratropium (ATROVENT) 0.06 % nasal spray Place 2 sprays into both nostrils 4 (four) times daily. (Patient taking differently: Place 2 sprays into both nostrils 2 (two) times a day. ) 15 mL 0   . isosorbide mononitrate (IMDUR) 30 MG 24 hr tablet Take 0.5 tablets (15 mg total) by mouth daily. (Patient taking differently: Take 15 mg by mouth daily as needed (chest tightness). ) 30 tablet 11   . levothyroxine (EUTHYROX) 100 MCG tablet Take 1 tablet (100 mcg total) by mouth daily before breakfast. Must have office visit for refills 30 tablet 0   . metoprolol tartrate (LOPRESSOR) 25  MG tablet Take 1 tablet (25 mg total) by mouth 2 (two) times daily. Hold for SBP<90 and HR<50 60 tablet 11   . montelukast (SINGULAIR) 10 MG tablet TAKE 1 TABLET BY MOUTH AT BEDTIME 90 tablet 0   . nitroGLYCERIN (NITROSTAT) 0.4 MG SL tablet Place 1 tablet (0.4 mg total) under the tongue every 5 (five) minutes as needed for chest pain. 30 tablet 0   .  pantoprazole (PROTONIX) 40 MG tablet Take 1 tablet (40 mg total) by mouth 2 (two) times daily. 70 tablet 0   . rosuvastatin (CRESTOR) 40 MG tablet Take 1 tablet (40 mg total) by mouth daily at 6 PM. 30 tablet 11   . sucralfate (CARAFATE) 1 GM/10ML suspension Take 10 mLs (1 g total) by mouth 2 (two) times daily for 30 days. 600 mL 0     Family History  Problem Relation Age of Onset  . Coronary artery disease Father   . Congestive Heart Failure Father   . Asthma Father   . Cancer Mother        T cell lymphoma     Review of Systems:   Review of Systems  Constitutional: Negative.   HENT: Negative.   Eyes: Negative.   Respiratory: Negative.   Gastrointestinal: Positive for heartburn and nausea.  Genitourinary: Negative.   Musculoskeletal: Negative.   Skin: Negative.   Neurological: Negative.   Endo/Heme/Allergies: Negative.   Psychiatric/Behavioral: Positive for depression.   A comprehensive review of systems was negative.     Cardiac Review of Systems: Y or  [    ]= no  Chest Pain [    ]  Resting SOB [   ] Exertional SOB  [  ]  Orthopnea [  ]   Pedal Edema [   ]    Palpitations [  ] Syncope  [  ]   Presyncope [   ]  General Review of Systems: [Y] = yes [  ]=no Constitional: recent weight change [  ]; anorexia [  ]; fatigue [  ]; nausea [  ]; night sweats [  ]; fever [  ]; or chills [  ]                                                               Dental: Last Dentist visit:   Eye : blurred vision [  ]; diplopia [   ]; vision changes [  ];  Amaurosis fugax[  ]; Resp: cough [  ];  wheezing[  ];  hemoptysis[  ]; shortness of breath[   ]; paroxysmal nocturnal dyspnea[  ]; dyspnea on exertion[  ]; or orthopnea[  ];  GI:  gallstones[  ], vomiting[  ];  dysphagia[  ]; melena[  ];  hematochezia [  ]; heartburn[  ];   Hx of  Colonoscopy[  ]; GU: kidney stones [  ]; hematuria[  ];   dysuria [  ];  nocturia[  ];  history of     obstruction [  ]; urinary frequency [  ]             Skin: rash, swelling[  ];, hair loss[  ];  peripheral edema[  ];  or itching[  ]; Musculosketetal: myalgias[  ];  joint swelling[  ];  joint erythema[  ];  joint pain[  ];  back pain[  ];  Heme/Lymph: bruising[  ];  bleeding[  ];  anemia[  ];  Neuro: TIA[  ];  headaches[  ];  stroke[  ];  vertigo[  ];  seizures[  ];   paresthesias[  ];  difficulty walking[  ];  Psych:depression[  ]; anxiety[  ];  Endocrine: diabetes[  ];  thyroid  dysfunction[  ];              Physical Exam: BP (!) 89/62   Pulse 94   Temp 98.7 F (37.1 C) (Oral)   Resp 20   Ht 5\' 5"  (1.651 m)   Wt 68 kg   SpO2 99%   BMI 24.96 kg/m    General appearance: alert, cooperative and no distress Head: Normocephalic, without obvious abnormality, atraumatic Resp: clear to auscultation bilaterally Cardio: regular rate and rhythm, S1, S2 normal, no murmur, click, rub or gallop Extremities: extremities normal, atraumatic, no cyanosis or edema Neurologic: Alert and oriented X 3, normal strength and tone. Normal symmetric reflexes. Normal coordination and gait  Diagnostic Studies & Laboratory data:     Recent Radiology Findings:   Dg Chest Port 1 View  Result Date: 10/29/2018 CLINICAL DATA:  Central chest pain for several days EXAM: PORTABLE CHEST 1 VIEW COMPARISON:  05/24/2018 FINDINGS: The heart size and mediastinal contours are within normal limits. Both lungs are clear. The visualized skeletal structures are unremarkable. IMPRESSION: No active disease. Electronically Signed   By: Inez Catalina M.D.   On: 10/29/2018 11:56     I have independently reviewed the above radiologic studies  including LHC and echocardiography and discussed with the patient   Recent Lab Findings: Lab Results  Component Value Date   WBC 6.9 10/30/2018   HGB 10.3 (L) 10/30/2018   HCT 30.0 (L) 10/30/2018   PLT 265 10/30/2018   GLUCOSE 237 (H) 10/29/2018   CHOL 215 (H) 07/23/2018   TRIG 157 (H) 07/23/2018   HDL 63 07/23/2018   LDLCALC 121 (H) 07/23/2018   ALT 24 05/24/2018   AST 40 05/24/2018   NA 136 10/29/2018   K 3.5 10/29/2018   CL 102 10/29/2018   CREATININE 1.15 (H) 10/29/2018   BUN 24 (H) 10/29/2018   CO2 17 (L) 10/29/2018   TSH 1.830 07/23/2018   INR 1.1 10/29/2018   HGBA1C 9.7 (A) 10/02/2018      Assessment / Plan:       48 yo lady with severe 2V CAD. She is a great candidate for CABG. Will anticipate surgery on Monday due to Plavix up to this point. Also anticipate multi-arterial grafting as method for optimizing durability of procedure in this young patient. Thank you for allowing [name] to participate in her care.    I  spent 40 minutes counseling the patient face to face.   Yomara Toothman Z. Orvan Seen, [place] TCTS [phone] 10/30/2018 12:48 PM

## 2018-10-30 NOTE — Progress Notes (Signed)
Inpatient Diabetes Program Recommendations  AACE/ADA: New Consensus Statement on Inpatient Glycemic Control (2015)  Target Ranges:  Prepandial:   less than 140 mg/dL      Peak postprandial:   less than 180 mg/dL (1-2 hours)      Critically ill patients:  140 - 180 mg/dL   Results for LARAH, SHOWERS (MRN [id number]) as of 10/30/2018 10:28  Ref. Range 10/29/2018 13:28 10/29/2018 19:28 10/29/2018 23:52 10/30/2018 03:50 10/30/2018 07:08  Glucose-Capillary Latest Ref Range: 70 - 99 mg/dL 212 (H) 321 (H) 177 (H) 118 (H) 46    Admit with: NSTEMI/ Needs CABG  History: Type 1 Diabetes  Home DM Meds: Insulin Pump  Current Orders: None--Have requested the RN ask MD for the Insulin Pump order set    Endocrinologist: Dr. Renato Shin with [facility]--last seen 10/02/2018--At that last visit, it looks as if Dr. Loanne Drilling made some changes to pt's basal rates (1.6 units/hr from 6am to 10pm and 0.5 units/hr from 10pm to 6am) and instructed her to not cover her meals with her pump??  Plan to visit with pt today to inquire about this MD visit and what her actual pump settings are at present.  Previous to this MD visit with Dr. Loanne Drilling, pt's pump settings were as follows: Basal Rate- 0.675 units/hr 24 hours per day Total basal per 24 hour period- 16.2 units Carb Ratio- 1 unit for every 17 grams Carbohydrates Correction Ratio- 1 unit for every 60 mg/dl above Target CBG Target CBG 100 mg/dl  Spoke w/ RN caring for pt today.  Per RN, pt A&O and able to independently manage her insulin pump.  RN stated she would have the MD place Insulin Pump order set.  I asked if I could call into pt's room, however, there is no phone in the room.  Will have one of the RNs on the diabetes team at [facility] visit with pt today to verify pump settings, etc.  CBG 85 mg/dl this AM.     --Will follow patient during hospitalization--  Wyn Quaker RN, MSN, CDE Diabetes Coordinator Inpatient Glycemic  Control Team Team Pager: [phone] (8a-5p)

## 2018-10-31 ENCOUNTER — Encounter (HOSPITAL_COMMUNITY): Payer: Self-pay | Admitting: *Deleted

## 2018-10-31 ENCOUNTER — Inpatient Hospital Stay (HOSPITAL_COMMUNITY): Payer: Self-pay

## 2018-10-31 DIAGNOSIS — Z01810 Encounter for preprocedural cardiovascular examination: Secondary | ICD-10-CM

## 2018-10-31 LAB — PULMONARY FUNCTION TEST
DL/VA % pred: 85 %
DL/VA: 3.68 ml/min/mmHg/L
DLCO cor % pred: 83 %
DLCO cor: 18.35 ml/min/mmHg
DLCO unc % pred: 73 %
DLCO unc: 16.1 ml/min/mmHg
FEF 25-75 Post: 3.07 L/sec
FEF 25-75 Pre: 1.92 L/sec
FEF2575-%Change-Post: 59 %
FEF2575-%Pred-Post: 107 %
FEF2575-%Pred-Pre: 67 %
FEV1-%Change-Post: 13 %
FEV1-%Pred-Post: 101 %
FEV1-%Pred-Pre: 89 %
FEV1-Post: 2.97 L
FEV1-Pre: 2.63 L
FEV1FVC-%Change-Post: 6 %
FEV1FVC-%Pred-Pre: 90 %
FEV6-%Change-Post: 6 %
FEV6-%Pred-Post: 104 %
FEV6-%Pred-Pre: 98 %
FEV6-Post: 3.76 L
FEV6-Pre: 3.53 L
FEV6FVC-%Change-Post: 0 %
FEV6FVC-%Pred-Post: 101 %
FEV6FVC-%Pred-Pre: 100 %
FVC-%Change-Post: 5 %
FVC-%Pred-Post: 103 %
FVC-%Pred-Pre: 97 %
FVC-Post: 3.81 L
FVC-Pre: 3.6 L
Post FEV1/FVC ratio: 78 %
Post FEV6/FVC ratio: 99 %
Pre FEV1/FVC ratio: 73 %
Pre FEV6/FVC Ratio: 98 %
RV % pred: 101 %
RV: 1.84 L
TLC % pred: 103 %
TLC: 5.39 L

## 2018-10-31 LAB — URINALYSIS, ROUTINE W REFLEX MICROSCOPIC
Bilirubin Urine: NEGATIVE
Glucose, UA: NEGATIVE mg/dL
Hgb urine dipstick: NEGATIVE
Ketones, ur: NEGATIVE mg/dL
Leukocytes,Ua: NEGATIVE
Nitrite: NEGATIVE
Protein, ur: NEGATIVE mg/dL
Specific Gravity, Urine: 1.004 — ABNORMAL LOW (ref 1.005–1.030)
pH: 6 (ref 5.0–8.0)

## 2018-10-31 LAB — CBC
HCT: 29.5 % — ABNORMAL LOW (ref 36.0–46.0)
HCT: 29.2 % — ABNORMAL LOW (ref 36.0–46.0)
Hemoglobin: 10 g/dL — ABNORMAL LOW (ref 12.0–15.0)
Hemoglobin: 9.8 g/dL — ABNORMAL LOW (ref 12.0–15.0)
MCH: 30.7 pg (ref 26.0–34.0)
MCH: 30.3 pg (ref 26.0–34.0)
MCHC: 33.9 g/dL (ref 30.0–36.0)
MCHC: 33.6 g/dL (ref 30.0–36.0)
MCV: 90.5 fL (ref 80.0–100.0)
MCV: 90.4 fL (ref 80.0–100.0)
Platelets: 259 10*3/uL (ref 150–400)
Platelets: 258 10*3/uL (ref 150–400)
RBC: 3.26 MIL/uL — ABNORMAL LOW (ref 3.87–5.11)
RBC: 3.23 MIL/uL — ABNORMAL LOW (ref 3.87–5.11)
RDW: 13.5 % (ref 11.5–15.5)
RDW: 13.4 % (ref 11.5–15.5)
WBC: 5.3 10*3/uL (ref 4.0–10.5)
WBC: 5.3 10*3/uL (ref 4.0–10.5)
nRBC: 0 % (ref 0.0–0.2)
nRBC: 0 % (ref 0.0–0.2)

## 2018-10-31 LAB — BASIC METABOLIC PANEL
Anion gap: 9 (ref 5–15)
Anion gap: 8 (ref 5–15)
BUN: 7 mg/dL (ref 6–20)
BUN: 7 mg/dL (ref 6–20)
CO2: 24 mmol/L (ref 22–32)
CO2: 26 mmol/L (ref 22–32)
Calcium: 8.2 mg/dL — ABNORMAL LOW (ref 8.9–10.3)
Calcium: 8.5 mg/dL — ABNORMAL LOW (ref 8.9–10.3)
Chloride: 106 mmol/L (ref 98–111)
Chloride: 105 mmol/L (ref 98–111)
Creatinine, Ser: 0.93 mg/dL (ref 0.44–1.00)
Creatinine, Ser: 0.92 mg/dL (ref 0.44–1.00)
GFR calc Af Amer: >60 mL/min (ref >60)
GFR calc Af Amer: >60 mL/min (ref >60)
GFR calc non Af Amer: >60 mL/min (ref >60)
GFR calc non Af Amer: >60 mL/min (ref >60)
Glucose, Bld: 260 mg/dL — ABNORMAL HIGH (ref 70–99)
Glucose, Bld: 215 mg/dL — ABNORMAL HIGH (ref 70–99)
Potassium: 3.5 mmol/L (ref 3.5–5.1)
Potassium: 3.1 mmol/L — ABNORMAL LOW (ref 3.5–5.1)
Sodium: 139 mmol/L (ref 135–145)
Sodium: 139 mmol/L (ref 135–145)

## 2018-10-31 LAB — HEPARIN LEVEL (UNFRACTIONATED): Heparin Unfractionated: 0.38 IU/mL (ref 0.30–0.70)

## 2018-10-31 LAB — GLUCOSE, CAPILLARY
Glucose-Capillary: 247 mg/dL — ABNORMAL HIGH (ref 70–99)
Glucose-Capillary: 208 mg/dL — ABNORMAL HIGH (ref 70–99)
Glucose-Capillary: 193 mg/dL — ABNORMAL HIGH (ref 70–99)

## 2018-10-31 LAB — MRSA PCR SCREENING: MRSA by PCR: NEGATIVE

## 2018-10-31 MED ORDER — SODIUM CHLORIDE 0.9% FLUSH: 3.0000 mL | INTRAVENOUS | Status: DC | PRN

## 2018-10-31 MED ORDER — SODIUM CHLORIDE 0.9% FLUSH
3.0000 mL | Freq: Two times a day (BID) | INTRAVENOUS | Status: DC
  Administered 2018-11-01: 3 mL via INTRAVENOUS

## 2018-10-31 MED ORDER — POTASSIUM CHLORIDE CRYS ER 20 MEQ PO TBCR
40.0000 meq | EXTENDED_RELEASE_TABLET | Freq: Once | ORAL | Status: AC
  Administered 2018-10-31: 18:00:00 40 meq via ORAL
  Filled 2018-10-31: qty 2

## 2018-10-31 MED ORDER — SODIUM CHLORIDE 0.9 % IV SOLN: 250.0000 mL | INTRAVENOUS | Status: DC | PRN

## 2018-10-31 MED ORDER — INFLUENZA VAC SPLIT QUAD 0.5 ML IM SUSY
0.5000 mL | PREFILLED_SYRINGE | INTRAMUSCULAR | Status: AC | PRN
  Administered 2018-11-09: 15:00:00 0.5 mL via INTRAMUSCULAR
  Filled 2018-10-31 (×2): qty 0.5

## 2018-10-31 MED ORDER — ATORVASTATIN CALCIUM 80 MG PO TABS
80.0000 mg | ORAL_TABLET | Freq: Every day | ORAL | Status: DC
  Administered 2018-10-31 – 2018-11-08 (×8): 80 mg via ORAL
  Filled 2018-10-31 (×8): qty 1

## 2018-10-31 MED ORDER — ALBUTEROL SULFATE (2.5 MG/3ML) 0.083% IN NEBU
2.5000 mg | INHALATION_SOLUTION | Freq: Once | RESPIRATORY_TRACT | Status: AC
  Administered 2018-10-31: 2.5 mg via RESPIRATORY_TRACT

## 2018-10-31 MED ORDER — PNEUMOCOCCAL VAC POLYVALENT 25 MCG/0.5ML IJ INJ: 0.5000 mL | INJECTION | INTRAMUSCULAR | Status: DC | PRN

## 2018-10-31 NOTE — Telephone Encounter (Signed)
Jennifer Hogan, pt had appt scheduled 10/15 so no need to involve scheduling.  However, it now appears patient will be undergoing bypass surgery while inpatient on 10/12 so will cancel this appt. Disregard TOC message for now - this will be revisited by inpatient team closer to dc.   Dayna Dunn PA-C

## 2018-10-31 NOTE — Progress Notes (Signed)
ANTICOAGULATION CONSULT NOTE - Follow-up Consult  Pharmacy Consult for Heparin Indication: chest pain/ACS  Allergies  Allergen Reactions  . Peanut-Containing Drug Products Other (See Comments)    Vomiting, upset stomach and some wheezing  . Ciprofloxin Hcl [Ciprofloxacin] Nausea And Vomiting and Other (See Comments)    Severe migraine  . Food Color Red [Red Dye] Diarrhea    Patient Measurements: Height: 5\' 5"  (165.1 cm) Weight: 150 lb (68 kg) IBW/kg (Calculated) : 57 Heparin Dosing Weight: 68  Vital Signs: Temp: 98.9 F (37.2 C) (10/08 0413) Temp Source: Oral (10/08 0413) BP: 120/79 (10/08 0600) Pulse Rate: 78 (10/08 0600)  Labs: Recent Labs    10/29/18 1128 10/29/18 1204 10/29/18 1430 10/30/18 0647 10/30/18 2147 10/31/18 0231  HGB 13.4 13.9  --  10.3*  --  10.0*  HCT 39.5 41.0  --  30.0*  --  29.5*  PLT 430*  --   --  265  --  259  LABPROT 14.2  --   --   --   --   --   INR 1.1  --   --   --   --   --   HEPARINUNFRC  --   --   --  0.22* 0.50 0.38  CREATININE 1.15*  --   --   --   --  0.93  TROPONINIHS 1,138*  --  1,132*  --   --   --     Estimated Creatinine Clearance: 65.8 mL/min (by C-G formula based on SCr of 0.93 mg/dL).   Medical History: Past Medical History:  Diagnosis Date  . Anemia   . Anxiety   . Coronary artery disease    a. s/pPTCA DES x3 in the LAD (ostial DES placed, mid DES placed, distal DES placed)and PTCA/DES x1to intermediate branch 02/2017. b. ACS 02/08/18 with LCx stent placement.  . Depression   . Diabetes mellitus    type 1, on insulin pump  . DKA (diabetic ketoacidoses) ([place]) 12/31/2017  . Elevated platelet count   . High cholesterol   . Hypothyroidism   . Ischemic cardiomyopathy    a. EF low-normal with basal inferior akinesis, basal septal hypokinesis by echo 01/2018.     Assessment: 48 yo female with a history of CAD and stent placement. Came in with heart pounding was the worst on Monday. She had chest aching on Sunday,  5-6/10. Worse w/ deep inspiration. Tried 1/2 tab isosorbide, no help. Could not find SL NTG. Took metoprolol but BP was low. Monday, the pounding was worse and she had chest pain that started in the middle and spread. Felt like a burning pain, 8/10. Did not feel like any meds helped. She was hurting all over. Tried a hydrocodone, no help, threw it up. Felt a little better if she pressed on her chest. Today, she was extremely weak and came to the ER.   Pharmacy is consulted to dose heparin for ACS/NSTEMI. Trop 1138.  Patient was not on any anticoagulants PTA other than aspirin and Plavix. Plan is for CABG on Monday 10/12. Will need to hold Plavix for washout period.   Heparin level is therapeutic this morning at 0.38 on 950 units/hr.  CBC low but stable. Will continue to monitor. No active bleeding or line issues per nurse.  Goal of Therapy:  Heparin level 0.3-0.7 units/ml Monitor platelets by anticoagulation protocol: Yes   Plan:  Continue heparin infusion at 950 units/hr Daily heparin level and CBC Monitor for s/sx of bleeding Continue  to monitor H&H and platelets  Kennon Holter, PharmD PGY1 Ambulatory Care Pharmacy Resident [employer] Phone: [phone] 10/31/2018,9:17 AM

## 2018-10-31 NOTE — Progress Notes (Addendum)
Progress Note  Patient Name: Jennifer Hogan Date of Encounter: 10/31/2018  Primary Cardiologist: Fransico Him, MD   Subjective   Feels well today.  No chest pain or dyspnea.   Inpatient Medications    Scheduled Meds: . aspirin EC  81 mg Oral Daily  . Chlorhexidine Gluconate Cloth  6 each Topical Daily  . FLUoxetine  40 mg Oral Daily  . insulin pump   Subcutaneous TID AC, HS, 0200  . levothyroxine  100 mcg Oral Q0600  . montelukast  10 mg Oral QHS  . pantoprazole  40 mg Oral Daily  . rosuvastatin  40 mg Oral q1800   Continuous Infusions: . heparin 950 Units/hr (10/31/18 0005)   PRN Meds:    Vital Signs    Vitals:   10/31/18 0400 10/31/18 0413 10/31/18 0500 10/31/18 0600  BP: (!) 96/46  103/64 120/79  Pulse: 71  72 78  Resp: 13  18 (!) 25  Temp:  98.9 F (37.2 C)    TempSrc:  Oral    SpO2: 97%  96% 99%  Weight:      Height:        Intake/Output Summary (Last 24 hours) at 10/31/2018 0743 Last data filed at 10/31/2018 0600 Gross per 24 hour  Intake 378.75 ml  Output 2 ml  Net 376.75 ml   Last 3 Weights 10/29/2018 10/29/2018 10/02/2018  Weight (lbs) 150 lb 150 lb 152 lb 12.8 oz  Weight (kg) 68.04 kg 68.04 kg 69.31 kg      Telemetry    NSR - Personally Reviewed  ECG    NSR with old anterolateral infarct. T wave inversion in inferior and lateral leads - Personally Reviewed  Physical Exam   GEN: No acute distress.   Neck: No JVD Cardiac: RRR, no murmurs, rubs, or gallops.  Respiratory: Clear to auscultation bilaterally. GI: Soft, nontender, non-distended  MS: No edema; No deformity. Right radial site without hematoma. Neuro:  Nonfocal  Psych: Normal affect   Labs    High Sensitivity Troponin:   Recent Labs  Lab 10/29/18 1128 10/29/18 1430  TROPONINIHS 1,138* 1,132*      Chemistry Recent Labs  Lab 10/29/18 1128 10/29/18 1204 10/31/18 0231  NA 136 136 139  K 3.5 3.5 3.5  CL 102  --  106  CO2 17*  --  24  GLUCOSE 237*  --  260*  BUN  24*  --  7  CREATININE 1.15*  --  0.93  CALCIUM 9.0  --  8.2*  GFRNONAA 56*  --  >60  GFRAA >60  --  >60  ANIONGAP 17*  --  9     Hematology Recent Labs  Lab 10/29/18 1128 10/29/18 1204 10/30/18 0647 10/31/18 0231  WBC 9.6  --  6.9 5.3  RBC 4.43  --  3.30* 3.26*  HGB 13.4 13.9 10.3* 10.0*  HCT 39.5 41.0 30.0* 29.5*  MCV 89.2  --  90.9 90.5  MCH 30.2  --  31.2 30.7  MCHC 33.9  --  34.3 33.9  RDW 13.2  --  13.6 13.5  PLT 430*  --  265 259    BNPNo results for input(s): BNP, PROBNP in the last 168 hours.   DDimer No results for input(s): DDIMER in the last 168 hours.   Radiology    Dg Chest Port 1 View  Result Date: 10/29/2018 CLINICAL DATA:  Central chest pain for several days EXAM: PORTABLE CHEST 1 VIEW COMPARISON:  05/24/2018 FINDINGS: The  heart size and mediastinal contours are within normal limits. Both lungs are clear. The visualized skeletal structures are unremarkable. IMPRESSION: No active disease. Electronically Signed   By: Inez Catalina M.D.   On: 10/29/2018 11:56    Cardiac Studies    Ost Cx to Prox Cx lesion is 99% stenosed.  Ramus-2 lesion is 20% stenosed.  Ramus-1 lesion is 60% stenosed.  Ramus-3 lesion is 70% stenosed.  Prox LAD to Mid LAD lesion is 10% stenosed.  Previously placed Mid LAD to Dist LAD stent (unknown type) is widely patent.  Dist LAD-1 lesion is 99% stenosed.  Dist LAD-2 lesion is 99% stenosed.  There is mild to moderate left ventricular systolic dysfunction.  LV end diastolic pressure is normal.  The left ventricular ejection fraction is 35-45% by visual estimate.  There is no mitral valve regurgitation.  Mid LAD lesion is 60% stenosed.  Prox Cx to Mid Cx lesion is 70% stenosed.   1. Severe double vessel CAD 2. The LAD has a patent ostial/proximal stented segment with no restenosis. The distal stented segment has no restenosis. The mid vessel between stented segments has a moderately severe, eccentric stenosis. The  small caliber, apical LAD has diffuse severe serial stenoses.  3. The intermediate branch (early diagonal) has a moderately severe ostial/proximal stenosis followed by a patent mid stented segment. The stented segment has moderate restenosis.  Just beyond the stented segment there is a severe stenosis.  4. The Circumflex has an ostial/proximal stent that has severe restenosis within the stent. The mid Circumflex has a moderately severe stenosis.  5. The RCA is a large dominant artery with no obstructive disease 6. Moderate segmental LV systolic dysfunction. LVEF around 40%.   Recommendations: I have reviewed her films today with Dr. Martinique. Her culprit lesion is likely the ostial Circumflex stented segment that shows severe stent restenosis. Her disease has progressed in the intermediate branch and the LAD. She is a diabetic and given the aggressive nature of her disease with evidence of stent restenosis in a short period of time, I think we should consider bypass surgery. We will resume heparin 8 hours after the sheath pull. Will admit to the ICU. Will ask CT surgery to see her to discuss CABG. Plavix will be held.   Echo: IMPRESSIONS    1. Left ventricular ejection fraction, by visual estimation, is 45 to 50%. The left ventricle has mild to moderately decreased function. Left ventricular septal wall thickness was mildly increased. Normal left ventricular posterior wall thickness. There  is mildly increased left ventricular hypertrophy.  2. Inferior septal and apical hypokinesis.  3. Global right ventricle has normal systolic function.The right ventricular size is normal. No increase in right ventricular wall thickness.  4. Left atrial size was normal.  5. Right atrial size was normal.  6. The mitral valve is normal in structure. Mild mitral valve regurgitation.  7. The tricuspid valve is normal in structure. Tricuspid valve regurgitation is mild.  8. The aortic valve is normal in structure.  Aortic valve regurgitation was not visualized by color flow Doppler. Mild aortic valve sclerosis without stenosis.  9. The pulmonic valve was grossly normal. Pulmonic valve regurgitation is mild by color flow Doppler. 10. Mildly elevated pulmonary artery systolic pressure.   Patient Profile     48 y.o. female with type 1 IDDM, HLD and CAD s/p multiple prior stents in the last 2 years presents with NSTEMI   Assessment & Plan    1. NSTEMI. Ecg  with ST/T depression in lateral leads that is new. Troponin up to 1138. Based on cath data she has critical restenosis in stent of ostial LCx. She also has significant stenosis in the ramus intermediate vessel and 60% mid LAD. Distal LAD disease is not suitable for revascularization. Surgical consultation Appreciated. Plan CABG on Monday after Plavix wash out.  Will continue IV heparin. Due to low BP  beta blocker/nitrates held. Echo shows EF 45-50%. No significant valvular abnormality.  Awaiting  transfer to telemetry today. Cardiac Rehab. Patient would like to go home to await surgery but she is at high risk for stent occlusion in LCx especially with Plavix on hold. She needs to stay until surgery. 2. DM type 1 on insulin. Glucose controlled. Last A1c 9.4. Complicated by gastroparesis.  3. Anemia. No clear bleeding. Will give Protonix prophylaxis. Hgb stable. Heme check stool.  4. Hypercholesterolemia. On high dose Crestor.  5. Ischemic cardiomyopathy/Chronic systolic CHF. EF 40% by cath. Echo 45-50%. Unable to initiate CHF meds due to hypotension. No overtly in failure.   For questions or updates, please contact [place] Please consult [email or web address] for contact info under        Signed, Peter Martinique, MD  10/31/2018, 7:43 AM

## 2018-11-01 DIAGNOSIS — I952 Hypotension due to drugs: Secondary | ICD-10-CM

## 2018-11-01 LAB — GLUCOSE, CAPILLARY
Glucose-Capillary: 71 mg/dL (ref 70–99)
Glucose-Capillary: 276 mg/dL — ABNORMAL HIGH (ref 70–99)
Glucose-Capillary: 137 mg/dL — ABNORMAL HIGH (ref 70–99)
Glucose-Capillary: 92 mg/dL (ref 70–99)
Glucose-Capillary: 193 mg/dL — ABNORMAL HIGH (ref 70–99)

## 2018-11-01 LAB — BASIC METABOLIC PANEL
Anion gap: 9 (ref 5–15)
BUN: 6 mg/dL (ref 6–20)
CO2: 27 mmol/L (ref 22–32)
Calcium: 8.7 mg/dL — ABNORMAL LOW (ref 8.9–10.3)
Chloride: 106 mmol/L (ref 98–111)
Creatinine, Ser: 0.95 mg/dL (ref 0.44–1.00)
GFR calc Af Amer: >60 mL/min (ref >60)
GFR calc non Af Amer: >60 mL/min (ref >60)
Glucose, Bld: 76 mg/dL (ref 70–99)
Potassium: 3.2 mmol/L — ABNORMAL LOW (ref 3.5–5.1)
Sodium: 142 mmol/L (ref 135–145)

## 2018-11-01 LAB — CBC
HCT: 32.7 % — ABNORMAL LOW (ref 36.0–46.0)
Hemoglobin: 11 g/dL — ABNORMAL LOW (ref 12.0–15.0)
MCH: 30.7 pg (ref 26.0–34.0)
MCHC: 33.6 g/dL (ref 30.0–36.0)
MCV: 91.3 fL (ref 80.0–100.0)
Platelets: 300 10*3/uL (ref 150–400)
RBC: 3.58 MIL/uL — ABNORMAL LOW (ref 3.87–5.11)
RDW: 13.4 % (ref 11.5–15.5)
WBC: 5.8 10*3/uL (ref 4.0–10.5)
nRBC: 0 % (ref 0.0–0.2)

## 2018-11-01 LAB — SURGICAL PCR SCREEN
MRSA, PCR: NEGATIVE
Staphylococcus aureus: NEGATIVE

## 2018-11-01 LAB — HEPARIN LEVEL (UNFRACTIONATED): Heparin Unfractionated: 0.48 IU/mL (ref 0.30–0.70)

## 2018-11-01 MED ORDER — POTASSIUM CHLORIDE CRYS ER 20 MEQ PO TBCR
40.0000 meq | EXTENDED_RELEASE_TABLET | Freq: Once | ORAL | Status: AC
  Administered 2018-11-01: 12:00:00 40 meq via ORAL
  Filled 2018-11-01: qty 2

## 2018-11-01 NOTE — Progress Notes (Signed)
Progress Note  Patient Name: Jennifer Hogan Date of Encounter: 11/01/2018  Primary Cardiologist: Fransico Him, MD   Subjective   Feels well today.  No chest pain or dyspnea.   Inpatient Medications    Scheduled Meds:  aspirin EC  81 mg Oral Daily   atorvastatin  80 mg Oral q1800   Chlorhexidine Gluconate Cloth  6 each Topical Daily   FLUoxetine  40 mg Oral Daily   insulin pump   Subcutaneous TID AC, HS, 0200   levothyroxine  100 mcg Oral Q0600   montelukast  10 mg Oral QHS   pantoprazole  40 mg Oral Daily   potassium chloride  40 mEq Oral Once   sodium chloride flush  3 mL Intravenous Q12H   Continuous Infusions:  sodium chloride     heparin 950 Units/hr (10/31/18 2210)   PRN Meds:    Vital Signs    Vitals:   10/31/18 1405 10/31/18 1502 10/31/18 1947 11/01/18 0400  BP: 109/66 103/72 107/68 116/72  Pulse: 78  79 73  Resp: (!) 23   17  Temp:  98.7 F (37.1 C) 98.6 F (37 C) 98.3 F (36.8 C)  TempSrc:  Oral Oral Oral  SpO2: 99%  99% 98%  Weight:    68 kg  Height:        Intake/Output Summary (Last 24 hours) at 11/01/2018 1056 Last data filed at 11/01/2018 0645 Gross per 24 hour  Intake 1236.97 ml  Output --  Net 1236.97 ml   Last 3 Weights 11/01/2018 10/29/2018 10/29/2018  Weight (lbs) 149 lb 14.6 oz 150 lb 150 lb  Weight (kg) 68 kg 68.04 kg 68.04 kg      Telemetry    NSR - Personally Reviewed  ECG    NSR with old anterolateral infarct. T wave inversion in inferior and lateral leads - Personally Reviewed  Physical Exam   GEN: No acute distress.   Neck: No JVD Cardiac: RRR, no murmurs, rubs, or gallops.  Respiratory: Clear to auscultation bilaterally. GI: Soft, nontender, non-distended  MS: No edema; No deformity. Right radial site without hematoma. Neuro:  Nonfocal  Psych: Normal affect   Labs    High Sensitivity Troponin:   Recent Labs  Lab 10/29/18 1128 10/29/18 1430  TROPONINIHS 1,138* 1,132*      Chemistry Recent  Labs  Lab 10/31/18 0231 10/31/18 1557 11/01/18 0419  NA 139 139 142  K 3.5 3.1* 3.2*  CL 106 105 106  CO2 24 26 27   GLUCOSE 260* 215* 76  BUN 7 7 6   CREATININE 0.93 0.92 0.95  CALCIUM 8.2* 8.5* 8.7*  GFRNONAA >60 >60 >60  GFRAA >60 >60 >60  ANIONGAP 9 8 9      Hematology Recent Labs  Lab 10/31/18 0231 10/31/18 1557 11/01/18 0419  WBC 5.3 5.3 5.8  RBC 3.26* 3.23* 3.58*  HGB 10.0* 9.8* 11.0*  HCT 29.5* 29.2* 32.7*  MCV 90.5 90.4 91.3  MCH 30.7 30.3 30.7  MCHC 33.9 33.6 33.6  RDW 13.5 13.4 13.4  PLT 259 258 300    BNPNo results for input(s): BNP, PROBNP in the last 168 hours.   DDimer No results for input(s): DDIMER in the last 168 hours.   Radiology    Vas US Doppler Pre Cabg  Result Date: 10/31/2018 PREOPERATIVE VASCULAR EVALUATION  Indications:      Pre CABG. Risk Factors:     Hyperlipidemia, Diabetes, coronary artery disease. Comparison Study: No prior study Performing Technologist: Antonieta Pert  RDMS, RVT  Examination Guidelines: A complete evaluation includes B-mode imaging, spectral Doppler, color Doppler, and power Doppler as needed of all accessible portions of each vessel. Bilateral testing is considered an integral part of a complete examination. Limited examinations for reoccurring indications may be performed as noted.  Right Carotid Findings: +----------+--------+--------+--------+--------+--------+             PSV cm/s EDV cm/s Stenosis Describe Comments  +----------+--------+--------+--------+--------+--------+  CCA Prox   98       22                                   +----------+--------+--------+--------+--------+--------+  CCA Distal 83       29                                   +----------+--------+--------+--------+--------+--------+  ICA Prox   72       29                                   +----------+--------+--------+--------+--------+--------+  ICA Distal 76       27                                    +----------+--------+--------+--------+--------+--------+  ECA        97       16                                   +----------+--------+--------+--------+--------+--------+ Portions of this table do not appear on this page. +----------+--------+-------+----------------+------------+             PSV cm/s EDV cms Describe         Arm Pressure  +----------+--------+-------+----------------+------------+  Subclavian 119              Multiphasic, WNL 92            +----------+--------+-------+----------------+------------+ +---------+--------+--+--------+--+---------+  Vertebral PSV cm/s 48 EDV cm/s 19 Antegrade  +---------+--------+--+--------+--+---------+ Left Carotid Findings: +----------+--------+--------+--------+--------+--------+             PSV cm/s EDV cm/s Stenosis Describe Comments  +----------+--------+--------+--------+--------+--------+  CCA Prox   107      22                                   +----------+--------+--------+--------+--------+--------+  CCA Distal 66       20                                   +----------+--------+--------+--------+--------+--------+  ICA Prox   90       34                                   +----------+--------+--------+--------+--------+--------+  ICA Distal 101      47                                   +----------+--------+--------+--------+--------+--------+  ECA        108      14                                   +----------+--------+--------+--------+--------+--------+ +----------+--------+--------+----------------+------------+  Subclavian PSV cm/s EDV cm/s Describe         Arm Pressure  +----------+--------+--------+----------------+------------+             182               Multiphasic, WNL 100           +----------+--------+--------+----------------+------------+ +---------+--------+--+--------+--+---------+  Vertebral PSV cm/s 48 EDV cm/s 17 Antegrade  +---------+--------+--+--------+--+---------+  ABI Findings:  +--------+------------------+-----+---------+--------+  Right    Rt Pressure (mmHg) Index Waveform  Comment   +--------+------------------+-----+---------+--------+  Brachial 92                       triphasic           +--------+------------------+-----+---------+--------+  PTA                               triphasic           +--------+------------------+-----+---------+--------+  DP                                triphasic           +--------+------------------+-----+---------+--------+ +--------+------------------+-----+---------+-------+  Left     Lt Pressure (mmHg) Index Waveform  Comment  +--------+------------------+-----+---------+-------+  Brachial 100                      triphasic          +--------+------------------+-----+---------+-------+  PTA                               triphasic          +--------+------------------+-----+---------+-------+  DP                                triphasic          +--------+------------------+-----+---------+-------+  Right Doppler Findings: +-----------+--------+-----+---------+-----------------------------------------+  Site        Pressure Index Doppler   Comments                                   +-----------+--------+-----+---------+-----------------------------------------+  Brachial    92             triphasic                                            +-----------+--------+-----+---------+-----------------------------------------+  Radial                     triphasic                                            +-----------+--------+-----+---------+-----------------------------------------+  Ulnar  triphasic                                            +-----------+--------+-----+---------+-----------------------------------------+  Palmar Arch                          Signal decreases >50% with radial                                                compression, obliterates with ulnar                                              compression.                                +-----------+--------+-----+---------+-----------------------------------------+  Left Doppler Findings: +-----------+--------+-----+---------+--------------------+  Site        Pressure Index Doppler   Comments              +-----------+--------+-----+---------+--------------------+  Brachial    100            triphasic                       +-----------+--------+-----+---------+--------------------+  Radial                     triphasic                       +-----------+--------+-----+---------+--------------------+  Ulnar                      triphasic                       +-----------+--------+-----+---------+--------------------+  Palmar Arch                          Within normal limits  +-----------+--------+-----+---------+--------------------+  Summary: Right Carotid: Velocities in the right ICA are consistent with a 1-39% stenosis. Left Carotid: Velocities in the left ICA are consistent with a 1-39% stenosis. Vertebrals:  Bilateral vertebral arteries demonstrate antegrade flow. Subclavians: Normal flow hemodynamics were seen in bilateral subclavian              arteries.  Electronically signed by Monica Martinez MD on 10/31/2018 at 5:12:12 PM.    Final     Cardiac Studies    Ost Cx to Prox Cx lesion is 99% stenosed.  Ramus-2 lesion is 20% stenosed.  Ramus-1 lesion is 60% stenosed.  Ramus-3 lesion is 70% stenosed.  Prox LAD to Mid LAD lesion is 10% stenosed.  Previously placed Mid LAD to Dist LAD stent (unknown type) is widely patent.  Dist LAD-1 lesion is 99% stenosed.  Dist LAD-2 lesion is 99% stenosed.  There is mild to moderate left ventricular systolic dysfunction.  LV end diastolic pressure is normal.  The left ventricular ejection fraction is 35-45% by visual estimate.  There is no mitral valve regurgitation.  Mid LAD lesion is 60% stenosed.  Prox Cx to Mid Cx lesion is 70% stenosed.   1. Severe double vessel CAD 2. The LAD has a  patent ostial/proximal stented segment with no restenosis. The distal stented segment has no restenosis. The mid vessel between stented segments has a moderately severe, eccentric stenosis. The small caliber, apical LAD has diffuse severe serial stenoses.  3. The intermediate branch (early diagonal) has a moderately severe ostial/proximal stenosis followed by a patent mid stented segment. The stented segment has moderate restenosis.  Just beyond the stented segment there is a severe stenosis.  4. The Circumflex has an ostial/proximal stent that has severe restenosis within the stent. The mid Circumflex has a moderately severe stenosis.  5. The RCA is a large dominant artery with no obstructive disease 6. Moderate segmental LV systolic dysfunction. LVEF around 40%.   Recommendations: I have reviewed her films today with Dr. Martinique. Her culprit lesion is likely the ostial Circumflex stented segment that shows severe stent restenosis. Her disease has progressed in the intermediate branch and the LAD. She is a diabetic and given the aggressive nature of her disease with evidence of stent restenosis in a short period of time, I think we should consider bypass surgery. We will resume heparin 8 hours after the sheath pull. Will admit to the ICU. Will ask CT surgery to see her to discuss CABG. Plavix will be held.   Echo: IMPRESSIONS    1. Left ventricular ejection fraction, by visual estimation, is 45 to 50%. The left ventricle has mild to moderately decreased function. Left ventricular septal wall thickness was mildly increased. Normal left ventricular posterior wall thickness. There  is mildly increased left ventricular hypertrophy.  2. Inferior septal and apical hypokinesis.  3. Global right ventricle has normal systolic function.The right ventricular size is normal. No increase in right ventricular wall thickness.  4. Left atrial size was normal.  5. Right atrial size was normal.  6. The mitral  valve is normal in structure. Mild mitral valve regurgitation.  7. The tricuspid valve is normal in structure. Tricuspid valve regurgitation is mild.  8. The aortic valve is normal in structure. Aortic valve regurgitation was not visualized by color flow Doppler. Mild aortic valve sclerosis without stenosis.  9. The pulmonic valve was grossly normal. Pulmonic valve regurgitation is mild by color flow Doppler. 10. Mildly elevated pulmonary artery systolic pressure.   Patient Profile     48 y.o. female with type 1 IDDM, HLD and CAD s/p multiple prior stents in the last 2 years presents with NSTEMI   Assessment & Plan    1. NSTEMI. Ecg with ST/T depression in lateral leads that is new. Troponin up to 1138. Based on cath data she has critical restenosis in stent of ostial LCx. She also has significant stenosis in the ramus intermediate vessel and 60% mid LAD. Distal LAD disease is not suitable for revascularization. Surgical consultation Appreciated. Plan CABG on Monday after Plavix wash out.  Will continue IV heparin. Due to low BP  beta blocker/nitrates held. Echo shows EF 45-50%. No significant valvular abnormality.   2. DM type 1 on insulin. Glucose controlled. Last A1c 9.4. Complicated by gastroparesis.  3. Anemia. No clear bleeding. Will give Protonix prophylaxis. Hgb stable. Heme check stool.  4. Hypercholesterolemia. On high dose Crestor.  5. Ischemic cardiomyopathy/Chronic systolic CHF. EF 40% by cath. Echo 45-50%. Unable to initiate CHF meds due to hypotension. No overtly in failure.  6. Hypokalemia. Will replete  For questions or updates, please  contact [place] Please consult [email or web address] for contact info under        Signed, Kirby Cortese Martinique, MD  11/01/2018, 10:56 AM

## 2018-11-01 NOTE — Progress Notes (Signed)
Pt has been ambulating without problems. Did 6 laps in hall yesterday. Patent attorney for education. Discussed sternal precautions, IS (2300 mL), mobility post op, and d/c planning. She is working on d/c plan as she lives alone. Her mom has slight dementia but is probably suitable for supervision at d/c; she will discuss plan with distance family. Gave her materials to review over weekend. She knows to communicate with staff if any problems walking. [place] CES, ACSM 9:41 AM 11/01/2018

## 2018-11-01 NOTE — Progress Notes (Signed)
ANTICOAGULATION CONSULT NOTE - Follow-up Consult  Pharmacy Consult for Heparin Indication: chest pain/ACS  Allergies  Allergen Reactions  . Crestor [Rosuvastatin Calcium] Other (See Comments)    Severe myalgias and joint pain. Has tolerated atorvastatin though  . Peanut-Containing Drug Products Other (See Comments)    Vomiting, upset stomach and some wheezing  . Ciprofloxin Hcl [Ciprofloxacin] Nausea And Vomiting and Other (See Comments)    Severe migraine  . Food Color Red [Red Dye] Diarrhea    Patient Measurements: Height: 5\' 5"  (165.1 cm) Weight: 149 lb 14.6 oz (68 kg) IBW/kg (Calculated) : 57 Heparin Dosing Weight: 68  Vital Signs: Temp: 98.3 F (36.8 C) (10/09 0400) Temp Source: Oral (10/09 0400) BP: 116/72 (10/09 0400) Pulse Rate: 73 (10/09 0400)  Labs: Recent Labs    10/29/18 1128  10/29/18 1430  10/30/18 2147 10/31/18 0231 10/31/18 1557 11/01/18 0419  HGB 13.4   < >  --    < >  --  10.0* 9.8* 11.0*  HCT 39.5   < >  --    < >  --  29.5* 29.2* 32.7*  PLT 430*  --   --    < >  --  259 258 300  LABPROT 14.2  --   --   --   --   --   --   --   INR 1.1  --   --   --   --   --   --   --   HEPARINUNFRC  --   --   --    < > 0.50 0.38  --  0.48  CREATININE 1.15*  --   --   --   --  0.93 0.92 0.95  TROPONINIHS 1,138*  --  1,132*  --   --   --   --   --    < > = values in this interval not displayed.    Estimated Creatinine Clearance: 64.5 mL/min (by C-G formula based on SCr of 0.95 mg/dL).   Medical History: Past Medical History:  Diagnosis Date  . Anemia   . Anxiety   . Coronary artery disease    a. s/pPTCA DES x3 in the LAD (ostial DES placed, mid DES placed, distal DES placed)and PTCA/DES x1to intermediate branch 02/2017. b. ACS 02/08/18 with LCx stent placement.  . Depression   . Diabetes mellitus    type 1, on insulin pump  . DKA (diabetic ketoacidoses) ([place]) 12/31/2017  . Elevated platelet count   . High cholesterol   . Hypothyroidism   . Ischemic  cardiomyopathy    a. EF low-normal with basal inferior akinesis, basal septal hypokinesis by echo 01/2018.     Assessment: 48 yo female with a history of CAD and stent placement. Came in with heart pounding was the worst on Monday. She had chest aching on Sunday, 5-6/10. Worse w/ deep inspiration. Tried 1/2 tab isosorbide, no help. Could not find SL NTG. Took metoprolol but BP was low. Monday, the pounding was worse and she had chest pain that started in the middle and spread. Felt like a burning pain, 8/10. Did not feel like any meds helped. She was hurting all over. Tried a hydrocodone, no help, threw it up. Felt a little better if she pressed on her chest. Today, she was extremely weak and came to the ER.   Pharmacy is consulted to dose heparin for ACS/NSTEMI. Trop 1138.  Patient was not on any anticoagulants PTA other than aspirin  and Plavix. Plan is for CABG on Monday 10/12. Will need to hold Plavix for washout period.   Heparin level is therapeutic this morning at 0.48 on 950 units/hr.  CBC stable and improving. Will continue to monitor. No active bleeding or line issues per nurse.  Goal of Therapy:  Heparin level 0.3-0.7 units/ml Monitor platelets by anticoagulation protocol: Yes   Plan:  Continue heparin infusion at 950 units/hr Monitor daily heparin level and CBC Monitor for s/sx of bleeding  Kennon Holter, PharmD PGY1 Ambulatory Care Pharmacy Resident [employer] Phone: [phone] 11/01/2018,7:13 AM

## 2018-11-02 DIAGNOSIS — I429 Cardiomyopathy, unspecified: Secondary | ICD-10-CM

## 2018-11-02 DIAGNOSIS — I5023 Acute on chronic systolic (congestive) heart failure: Secondary | ICD-10-CM

## 2018-11-02 DIAGNOSIS — I5022 Chronic systolic (congestive) heart failure: Secondary | ICD-10-CM

## 2018-11-02 DIAGNOSIS — I25118 Atherosclerotic heart disease of native coronary artery with other forms of angina pectoris: Secondary | ICD-10-CM

## 2018-11-02 LAB — BASIC METABOLIC PANEL
Anion gap: 7 (ref 5–15)
BUN: 7 mg/dL (ref 6–20)
CO2: 28 mmol/L (ref 22–32)
Calcium: 9 mg/dL (ref 8.9–10.3)
Chloride: 103 mmol/L (ref 98–111)
Creatinine, Ser: 0.84 mg/dL (ref 0.44–1.00)
GFR calc Af Amer: >60 mL/min (ref >60)
GFR calc non Af Amer: >60 mL/min (ref >60)
Glucose, Bld: 168 mg/dL — ABNORMAL HIGH (ref 70–99)
Potassium: 3.9 mmol/L (ref 3.5–5.1)
Sodium: 138 mmol/L (ref 135–145)

## 2018-11-02 LAB — GLUCOSE, CAPILLARY
Glucose-Capillary: 173 mg/dL — ABNORMAL HIGH (ref 70–99)
Glucose-Capillary: 217 mg/dL — ABNORMAL HIGH (ref 70–99)
Glucose-Capillary: 94 mg/dL (ref 70–99)
Glucose-Capillary: 79 mg/dL (ref 70–99)

## 2018-11-02 LAB — CBC
HCT: 31.9 % — ABNORMAL LOW (ref 36.0–46.0)
Hemoglobin: 11 g/dL — ABNORMAL LOW (ref 12.0–15.0)
MCH: 30.7 pg (ref 26.0–34.0)
MCHC: 34.5 g/dL (ref 30.0–36.0)
MCV: 89.1 fL (ref 80.0–100.0)
Platelets: 301 10*3/uL (ref 150–400)
RBC: 3.58 MIL/uL — ABNORMAL LOW (ref 3.87–5.11)
RDW: 13.3 % (ref 11.5–15.5)
WBC: 6.7 10*3/uL (ref 4.0–10.5)
nRBC: 0 % (ref 0.0–0.2)

## 2018-11-02 LAB — HEPARIN LEVEL (UNFRACTIONATED): Heparin Unfractionated: 0.32 IU/mL (ref 0.30–0.70)

## 2018-11-02 LAB — ALBUMIN: Albumin: 3 g/dL — ABNORMAL LOW (ref 3.5–5.0)

## 2018-11-02 LAB — ALT: ALT: 22 U/L (ref 0–44)

## 2018-11-02 LAB — PROTEIN, TOTAL: Total Protein: 5.8 g/dL — ABNORMAL LOW (ref 6.5–8.1)

## 2018-11-02 LAB — HEMOGLOBIN A1C
Hgb A1c MFr Bld: 10.2 % — ABNORMAL HIGH (ref 4.8–5.6)
Mean Plasma Glucose: 246.04 mg/dL

## 2018-11-02 LAB — BILIRUBIN, TOTAL: Total Bilirubin: 0.2 mg/dL — ABNORMAL LOW (ref 0.3–1.2)

## 2018-11-02 LAB — AST: AST: 24 U/L (ref 15–41)

## 2018-11-02 LAB — ALKALINE PHOSPHATASE: Alkaline Phosphatase: 51 U/L (ref 38–126)

## 2018-11-02 MED ORDER — ONDANSETRON HCL 4 MG/2ML IJ SOLN: 4.0000 mg | Freq: Four times a day (QID) | INTRAMUSCULAR | Status: DC | PRN

## 2018-11-02 MED ORDER — NITROGLYCERIN 0.4 MG SL SUBL: 0.4000 mg | SUBLINGUAL_TABLET | SUBLINGUAL | Status: DC | PRN

## 2018-11-02 MED ORDER — SODIUM CHLORIDE 0.9 % IV SOLN: INTRAVENOUS | Status: AC

## 2018-11-02 MED ORDER — ACETAMINOPHEN 325 MG PO TABS: 650.0000 mg | ORAL_TABLET | ORAL | Status: DC | PRN

## 2018-11-02 MED ORDER — ALPRAZOLAM 0.25 MG PO TABS: 0.2500 mg | ORAL_TABLET | Freq: Two times a day (BID) | ORAL | Status: DC | PRN

## 2018-11-02 MED ORDER — MAGNESIUM HYDROXIDE 400 MG/5ML PO SUSP
30.0000 mL | Freq: Every day | ORAL | Status: DC | PRN
  Administered 2018-11-02: 30 mL via ORAL
  Filled 2018-11-02: qty 30

## 2018-11-02 NOTE — Plan of Care (Signed)
  Problem: Clinical Measurements: Goal: Respiratory complications will improve Outcome: Progressing Note: No s/s of respiratory complications noted.  Stable on room air. Goal: Cardiovascular complication will be avoided Outcome: Progressing Note: No s/s of cardiovascular complication noted.  VSS.  NSR on telemetry.   

## 2018-11-02 NOTE — Progress Notes (Signed)
Progress Note  Patient Name: Jennifer Hogan Date of Encounter: 11/02/2018  Primary Cardiologist: Fransico Him, MD   Subjective   Doing well this morning and denies chest pain, palpitations, shortness of breath.  She does feel her heart when she does any form of exercising or walking quickly.  She has a strong family history of heart disease.  Her father passed away earlier this year.  Her mother has dementia.  She calls [place] home.  Inpatient Medications    Scheduled Meds:  aspirin EC  81 mg Oral Daily   atorvastatin  80 mg Oral q1800   Chlorhexidine Gluconate Cloth  6 each Topical Daily   FLUoxetine  40 mg Oral Daily   insulin pump   Subcutaneous TID AC, HS, 0200   levothyroxine  100 mcg Oral Q0600   montelukast  10 mg Oral QHS   pantoprazole  40 mg Oral Daily   sodium chloride flush  3 mL Intravenous Q12H   Continuous Infusions:  sodium chloride     sodium chloride     heparin 1,000 Units/hr (11/02/18 0745)   PRN Meds: sodium chloride, acetaminophen, ALPRAZolam, influenza vac split quadrivalent PF, nitroGLYCERIN, ondansetron (ZOFRAN) IV, pneumococcal 23 valent vaccine, sodium chloride flush   Vital Signs    Vitals:   11/01/18 0400 11/01/18 1435 11/01/18 2132 11/02/18 0315  BP: 116/72 136/78 103/71 106/66  Pulse: 73 94 97 80  Resp: 17 18    Temp: 98.3 F (36.8 C) 98.5 F (36.9 C) 98.9 F (37.2 C) 98.2 F (36.8 C)  TempSrc: Oral Oral Oral Oral  SpO2: 98% 100% 99% 100%  Weight: 68 kg   66.6 kg  Height:        Intake/Output Summary (Last 24 hours) at 11/02/2018 0903 Last data filed at 11/01/2018 2359 Gross per 24 hour  Intake 1225 ml  Output --  Net 1225 ml   Filed Weights   10/29/18 1435 11/01/18 0400 11/02/18 0315  Weight: 68 kg 68 kg 66.6 kg    Telemetry    Sinus tachycardia- Personally Reviewed  ECG    None performed- Personally Reviewed  Physical Exam   GEN: No acute distress.   Neck: No JVD Cardiac:  Mildly  tachycardic, regular, no murmurs, rubs, or gallops.  Respiratory: Clear to auscultation bilaterally. GI: Soft, nontender, non-distended  MS: No edema; No deformity. Neuro:  Nonfocal  Psych: Normal affect   Labs    Chemistry Recent Labs  Lab 10/31/18 1557 11/01/18 0419 11/02/18 0753  NA 139 142 138  K 3.1* 3.2* 3.9  CL 105 106 103  CO2 26 27 28   GLUCOSE 215* 76 168*  BUN 7 6 7   CREATININE 0.92 0.95 0.84  CALCIUM 8.5* 8.7* 9.0  GFRNONAA >60 >60 >60  GFRAA >60 >60 >60  ANIONGAP 8 9 7      Hematology Recent Labs  Lab 10/31/18 1557 11/01/18 0419 11/02/18 0344  WBC 5.3 5.8 6.7  RBC 3.23* 3.58* 3.58*  HGB 9.8* 11.0* 11.0*  HCT 29.2* 32.7* 31.9*  MCV 90.4 91.3 89.1  MCH 30.3 30.7 30.7  MCHC 33.6 33.6 34.5  RDW 13.4 13.4 13.3  PLT 258 300 301    Cardiac EnzymesNo results for input(s): TROPONINI in the last 168 hours. No results for input(s): TROPIPOC in the last 168 hours.   BNPNo results for input(s): BNP, PROBNP in the last 168 hours.   DDimer No results for input(s): DDIMER in the last 168 hours.   Radiology  Vas US Doppler Pre Cabg  Result Date: 10/31/2018 PREOPERATIVE VASCULAR EVALUATION  Indications:      Pre CABG. Risk Factors:     Hyperlipidemia, Diabetes, coronary artery disease. Comparison Study: No prior study Performing Technologist: [place], RVT  Examination Guidelines: A complete evaluation includes B-mode imaging, spectral Doppler, color Doppler, and power Doppler as needed of all accessible portions of each vessel. Bilateral testing is considered an integral part of a complete examination. Limited examinations for reoccurring indications may be performed as noted.  Right Carotid Findings: +----------+--------+--------+--------+--------+--------+             PSV cm/s EDV cm/s Stenosis Describe Comments  +----------+--------+--------+--------+--------+--------+  CCA Prox   98       22                                    +----------+--------+--------+--------+--------+--------+  CCA Distal 83       29                                   +----------+--------+--------+--------+--------+--------+  ICA Prox   72       29                                   +----------+--------+--------+--------+--------+--------+  ICA Distal 76       27                                   +----------+--------+--------+--------+--------+--------+  ECA        97       16                                   +----------+--------+--------+--------+--------+--------+ Portions of this table do not appear on this page. +----------+--------+-------+----------------+------------+             PSV cm/s EDV cms Describe         Arm Pressure  +----------+--------+-------+----------------+------------+  Subclavian 119              Multiphasic, WNL 92            +----------+--------+-------+----------------+------------+ +---------+--------+--+--------+--+---------+  Vertebral PSV cm/s 48 EDV cm/s 19 Antegrade  +---------+--------+--+--------+--+---------+ Left Carotid Findings: +----------+--------+--------+--------+--------+--------+             PSV cm/s EDV cm/s Stenosis Describe Comments  +----------+--------+--------+--------+--------+--------+  CCA Prox   107      22                                   +----------+--------+--------+--------+--------+--------+  CCA Distal 66       20                                   +----------+--------+--------+--------+--------+--------+  ICA Prox   90       34                                   +----------+--------+--------+--------+--------+--------+  ICA Distal 101      47                                   +----------+--------+--------+--------+--------+--------+  ECA        108      14                                   +----------+--------+--------+--------+--------+--------+ +----------+--------+--------+----------------+------------+  Subclavian PSV cm/s EDV cm/s Describe         Arm Pressure   +----------+--------+--------+----------------+------------+             182               Multiphasic, WNL 100           +----------+--------+--------+----------------+------------+ +---------+--------+--+--------+--+---------+  Vertebral PSV cm/s 48 EDV cm/s 17 Antegrade  +---------+--------+--+--------+--+---------+  ABI Findings: +--------+------------------+-----+---------+--------+  Right    Rt Pressure (mmHg) Index Waveform  Comment   +--------+------------------+-----+---------+--------+  Brachial 92                       triphasic           +--------+------------------+-----+---------+--------+  PTA                               triphasic           +--------+------------------+-----+---------+--------+  DP                                triphasic           +--------+------------------+-----+---------+--------+ +--------+------------------+-----+---------+-------+  Left     Lt Pressure (mmHg) Index Waveform  Comment  +--------+------------------+-----+---------+-------+  Brachial 100                      triphasic          +--------+------------------+-----+---------+-------+  PTA                               triphasic          +--------+------------------+-----+---------+-------+  DP                                triphasic          +--------+------------------+-----+---------+-------+  Right Doppler Findings: +-----------+--------+-----+---------+-----------------------------------------+  Site        Pressure Index Doppler   Comments                                   +-----------+--------+-----+---------+-----------------------------------------+  Brachial    92             triphasic                                            +-----------+--------+-----+---------+-----------------------------------------+  Radial                     triphasic                                            +-----------+--------+-----+---------+-----------------------------------------+  Ulnar                      triphasic                                             +-----------+--------+-----+---------+-----------------------------------------+  Palmar Arch                          Signal decreases >50% with radial                                                compression, obliterates with ulnar                                              compression.                               +-----------+--------+-----+---------+-----------------------------------------+  Left Doppler Findings: +-----------+--------+-----+---------+--------------------+  Site        Pressure Index Doppler   Comments              +-----------+--------+-----+---------+--------------------+  Brachial    100            triphasic                       +-----------+--------+-----+---------+--------------------+  Radial                     triphasic                       +-----------+--------+-----+---------+--------------------+  Ulnar                      triphasic                       +-----------+--------+-----+---------+--------------------+  Palmar Arch                          Within normal limits  +-----------+--------+-----+---------+--------------------+  Summary: Right Carotid: Velocities in the right ICA are consistent with a 1-39% stenosis. Left Carotid: Velocities in the left ICA are consistent with a 1-39% stenosis. Vertebrals:  Bilateral vertebral arteries demonstrate antegrade flow. Subclavians: Normal flow hemodynamics were seen in bilateral subclavian              arteries.  Electronically signed by Monica Martinez MD on 10/31/2018 at 5:12:12 PM.    Final     Cardiac Studies    Ost Cx to Prox Cx lesion is 99% stenosed.  Ramus-2 lesion is 20% stenosed.  Ramus-1 lesion is 60% stenosed.  Ramus-3 lesion is 70% stenosed.  Prox LAD to Mid LAD lesion is 10% stenosed.  Previously placed Mid LAD to Dist LAD stent (unknown type) is widely patent.  Dist LAD-1 lesion is 99% stenosed.  Dist LAD-2 lesion is 99% stenosed.  There is mild to  moderate left ventricular systolic dysfunction.  LV end diastolic pressure is normal.  The left  ventricular ejection fraction is 35-45% by visual estimate.  There is no mitral valve regurgitation.  Mid LAD lesion is 60% stenosed.  Prox Cx to Mid Cx lesion is 70% stenosed.  1. Severe double vessel CAD 2. The LAD has a patent ostial/proximal stented segment with no restenosis. The distal stented segment has no restenosis. The mid vessel between stented segments has a moderately severe, eccentric stenosis. The small caliber, apical LAD has diffuse severe serial stenoses.  3. The intermediate branch (early diagonal) has a moderately severe ostial/proximal stenosis followed by a patent mid stented segment. The stented segment has moderate restenosis. Just beyond the stented segment there is a severe stenosis.  4. The Circumflex has an ostial/proximal stent that has severe restenosis within the stent. The mid Circumflex has a moderately severe stenosis.  5. The RCA is a large dominant artery with no obstructive disease 6. Moderate segmental LV systolic dysfunction. LVEF around 40%.   Recommendations: I have reviewed her films today with Dr. [place]. Her culprit lesion is likely the ostial Circumflex stented segment that shows severe stent restenosis. Her disease has progressed in the intermediate branch and the LAD. She is a diabetic and given the aggressive nature of her disease with evidence of stent restenosis in a short period of time, I think we should consider bypass surgery. We will resume heparin 8 hours after the sheath pull. Will admit to the ICU. Will ask CT surgery to see her to discuss CABG. Plavix will be held.   Echo: IMPRESSIONS   1. Left ventricular ejection fraction, by visual estimation, is 45 to 50%. The left ventricle has mild to moderately decreased function. Left ventricular septal wall thickness was mildly increased. Normal left ventricular posterior wall thickness.  There is mildly increased left ventricular hypertrophy. 2. Inferior septal and apical hypokinesis. 3. Global right ventricle has normal systolic function.The right ventricular size is normal. No increase in right ventricular wall thickness. 4. Left atrial size was normal. 5. Right atrial size was normal. 6. The mitral valve is normal in structure. Mild mitral valve regurgitation. 7. The tricuspid valve is normal in structure. Tricuspid valve regurgitation is mild. 8. The aortic valve is normal in structure. Aortic valve regurgitation was not visualized by color flow Doppler. Mild aortic valve sclerosis without stenosis. 9. The pulmonic valve was grossly normal. Pulmonic valve regurgitation is mild by color flow Doppler. 10. Mildly elevated pulmonary artery systolic pressure.   Patient Profile     48 y.o. female with type 1 IDDM, HLD and CAD s/p multiple prior stents in the last 2 years presents with NSTEMI  Assessment & Plan    1.  Non-STEMI: Symptomatically stable this morning.  Plan is for CABG on Monday.  Based on cath data she has critical restenosis in stent of ostial LCx. She also has significant stenosis in the ramus intermediate vessel and 60% mid LAD. Distal LAD disease is not suitable for percutaneous revascularization. Continue aspirin, atorvastatin, and IV heparin.  Soft blood pressures have limited beta-blockers and nitrates.  EF 45 to 50%.  2.  Anemia: Hemoglobin stable at 11.  Currently on Protonix for prophylaxis.  3.  Hypercholesterolemia: On high-dose rosuvastatin.  4.  Ischemic cardiomyopathy/chronic systolic heart failure: LVEF 45 to 50%.  Unable to initiate optimal medications due to soft blood pressures.  No signs of hypervolemia.  For questions or updates, please contact [place] Please consult [email or web address] for contact info under Cardiology/STEMI.      Signed, Kate Sable, MD  11/02/2018, 9:03 AM

## 2018-11-02 NOTE — Progress Notes (Signed)
ANTICOAGULATION CONSULT NOTE - Follow-up Consult  Pharmacy Consult for Heparin Indication: chest pain/ACS  Allergies  Allergen Reactions  . Crestor [Rosuvastatin Calcium] Other (See Comments)    Severe myalgias and joint pain. Has tolerated atorvastatin though  . Peanut-Containing Drug Products Other (See Comments)    Vomiting, upset stomach and some wheezing  . Ciprofloxin Hcl [Ciprofloxacin] Nausea And Vomiting and Other (See Comments)    Severe migraine  . Food Color Red [Red Dye] Diarrhea    Patient Measurements: Height: 5\' 5"  (165.1 cm) Weight: 146 lb 14.4 oz (66.6 kg) IBW/kg (Calculated) : 57 Heparin Dosing Weight: 68  Vital Signs: Temp: 98.2 F (36.8 C) (10/10 0315) Temp Source: Oral (10/10 0315) BP: 106/66 (10/10 0315) Pulse Rate: 80 (10/10 0315)  Labs: Recent Labs    10/31/18 0231 10/31/18 1557 11/01/18 0419 11/02/18 0344  HGB 10.0* 9.8* 11.0* 11.0*  HCT 29.5* 29.2* 32.7* 31.9*  PLT 259 258 300 301  HEPARINUNFRC 0.38  --  0.48 0.32  CREATININE 0.93 0.92 0.95  --     Estimated Creatinine Clearance: 64.5 mL/min (by C-G formula based on SCr of 0.95 mg/dL).   Medical History: Past Medical History:  Diagnosis Date  . Anemia   . Anxiety   . Coronary artery disease    a. s/pPTCA DES x3 in the LAD (ostial DES placed, mid DES placed, distal DES placed)and PTCA/DES x1to intermediate branch 02/2017. b. ACS 02/08/18 with LCx stent placement.  . Depression   . Diabetes mellitus    type 1, on insulin pump  . DKA (diabetic ketoacidoses) ([place]) 12/31/2017  . Elevated platelet count   . High cholesterol   . Hypothyroidism   . Ischemic cardiomyopathy    a. EF low-normal with basal inferior akinesis, basal septal hypokinesis by echo 01/2018.     Assessment: 48 yo female with a history of CAD and stent placement. Came in with heart pounding was the worst on Monday. She had chest aching on Sunday, 5-6/10. Worse w/ deep inspiration. Tried 1/2 tab isosorbide, no  help. Could not find SL NTG. Took metoprolol but BP was low. Monday, the pounding was worse and she had chest pain that started in the middle and spread. Felt like a burning pain, 8/10. Did not feel like any meds helped. She was hurting all over. Tried a hydrocodone, no help, threw it up. Felt a little better if she pressed on her chest. Today, she was extremely weak and came to the ER.   Pharmacy is consulted to dose heparin for ACS/NSTEMI. Trop 1138.  Patient was not on any anticoagulants PTA other than aspirin and Plavix. Plan is for CABG on Monday 10/12. Will need to hold Plavix for washout period.   Heparin level is therapeutic this morning at 0.32 on 950 units/hr. Since she is on the lower end of the therapeutic range, will increase the rate slightly.  CBC stable. Will continue to monitor. No active bleeding or line issues per nurse.  Goal of Therapy:  Heparin level 0.3-0.7 units/ml Monitor platelets by anticoagulation protocol: Yes   Plan:  Increase heparin infusion to 1000 units/hr Monitor daily heparin level and CBC Monitor for s/sx of bleeding  Kennon Holter, PharmD PGY1 Ambulatory Care Pharmacy Resident [employer] Phone: [phone] 11/02/2018,7:31 AM

## 2018-11-03 ENCOUNTER — Inpatient Hospital Stay (HOSPITAL_COMMUNITY): Payer: Self-pay

## 2018-11-03 DIAGNOSIS — I255 Ischemic cardiomyopathy: Secondary | ICD-10-CM

## 2018-11-03 DIAGNOSIS — D649 Anemia, unspecified: Secondary | ICD-10-CM

## 2018-11-03 LAB — COMPREHENSIVE METABOLIC PANEL
ALT: 27 U/L (ref 0–44)
AST: 28 U/L (ref 15–41)
Albumin: 3.3 g/dL — ABNORMAL LOW (ref 3.5–5.0)
Alkaline Phosphatase: 52 U/L (ref 38–126)
Anion gap: 10 (ref 5–15)
BUN: 9 mg/dL (ref 6–20)
CO2: 23 mmol/L (ref 22–32)
Calcium: 9.1 mg/dL (ref 8.9–10.3)
Chloride: 103 mmol/L (ref 98–111)
Creatinine, Ser: 0.97 mg/dL (ref 0.44–1.00)
GFR calc Af Amer: >60 mL/min (ref >60)
GFR calc non Af Amer: >60 mL/min (ref >60)
Glucose, Bld: 197 mg/dL — ABNORMAL HIGH (ref 70–99)
Potassium: 4.2 mmol/L (ref 3.5–5.1)
Sodium: 136 mmol/L (ref 135–145)
Total Bilirubin: 0.4 mg/dL (ref 0.3–1.2)
Total Protein: 6.3 g/dL — ABNORMAL LOW (ref 6.5–8.1)

## 2018-11-03 LAB — BASIC METABOLIC PANEL
Anion gap: 10 (ref 5–15)
BUN: 7 mg/dL (ref 6–20)
CO2: 27 mmol/L (ref 22–32)
Calcium: 9.3 mg/dL (ref 8.9–10.3)
Chloride: 103 mmol/L (ref 98–111)
Creatinine, Ser: 0.89 mg/dL (ref 0.44–1.00)
GFR calc Af Amer: >60 mL/min (ref >60)
GFR calc non Af Amer: >60 mL/min (ref >60)
Glucose, Bld: 132 mg/dL — ABNORMAL HIGH (ref 70–99)
Potassium: 3.9 mmol/L (ref 3.5–5.1)
Sodium: 140 mmol/L (ref 135–145)

## 2018-11-03 LAB — CBC
HCT: 35.2 % — ABNORMAL LOW (ref 36.0–46.0)
Hemoglobin: 12.1 g/dL (ref 12.0–15.0)
MCH: 30.9 pg (ref 26.0–34.0)
MCHC: 34.4 g/dL (ref 30.0–36.0)
MCV: 89.8 fL (ref 80.0–100.0)
Platelets: 319 10*3/uL (ref 150–400)
RBC: 3.92 MIL/uL (ref 3.87–5.11)
RDW: 13.6 % (ref 11.5–15.5)
WBC: 6.8 10*3/uL (ref 4.0–10.5)
nRBC: 0 % (ref 0.0–0.2)

## 2018-11-03 LAB — GLUCOSE, CAPILLARY
Glucose-Capillary: 105 mg/dL — ABNORMAL HIGH (ref 70–99)
Glucose-Capillary: 218 mg/dL — ABNORMAL HIGH (ref 70–99)
Glucose-Capillary: 207 mg/dL — ABNORMAL HIGH (ref 70–99)
Glucose-Capillary: 201 mg/dL — ABNORMAL HIGH (ref 70–99)

## 2018-11-03 LAB — BLOOD GAS, ARTERIAL
Acid-Base Excess: 2.5 mmol/L — ABNORMAL HIGH (ref 0.0–2.0)
Bicarbonate: 25.5 mmol/L (ref 20.0–28.0)
Drawn by: 275531
FIO2: 21
O2 Saturation: 98.1 %
Patient temperature: 98.6
pCO2 arterial: 32.9 mmHg (ref 32.0–48.0)
pH, Arterial: 7.501 — ABNORMAL HIGH (ref 7.350–7.450)
pO2, Arterial: 106 mmHg (ref 83.0–108.0)

## 2018-11-03 LAB — HEPARIN LEVEL (UNFRACTIONATED): Heparin Unfractionated: 0.4 IU/mL (ref 0.30–0.70)

## 2018-11-03 LAB — ABO/RH: ABO/RH(D): A POS

## 2018-11-03 LAB — APTT: aPTT: 68 seconds — ABNORMAL HIGH (ref 24–36)

## 2018-11-03 MED ORDER — CHLORHEXIDINE GLUCONATE CLOTH 2 % EX PADS
6.0000 | MEDICATED_PAD | Freq: Once | CUTANEOUS | Status: AC
  Administered 2018-11-03: 6 via TOPICAL

## 2018-11-03 MED ORDER — EPINEPHRINE HCL 5 MG/250ML IV SOLN IN NS
0.0000 ug/min | INTRAVENOUS | Status: DC
  Filled 2018-11-03: qty 250

## 2018-11-03 MED ORDER — TRANEXAMIC ACID 1000 MG/10ML IV SOLN
1.5000 mg/kg/h | INTRAVENOUS | Status: DC
  Administered 2018-11-04: 1.5 mg/kg/h via INTRAVENOUS
  Filled 2018-11-03 (×2): qty 25

## 2018-11-03 MED ORDER — SODIUM CHLORIDE 0.9 % IV SOLN
1.5000 g | INTRAVENOUS | Status: DC
  Administered 2018-11-04: 08:00:00 1.5 g via INTRAVENOUS
  Filled 2018-11-03: qty 1.5

## 2018-11-03 MED ORDER — PHENYLEPHRINE HCL-NACL 20-0.9 MG/250ML-% IV SOLN
30.0000 ug/min | INTRAVENOUS | Status: DC
  Administered 2018-11-04: 08:00:00 20 ug/min via INTRAVENOUS
  Filled 2018-11-03: qty 250

## 2018-11-03 MED ORDER — BISACODYL 5 MG PO TBEC
5.0000 mg | DELAYED_RELEASE_TABLET | Freq: Once | ORAL | Status: AC
  Administered 2018-11-03: 5 mg via ORAL
  Filled 2018-11-03: qty 1

## 2018-11-03 MED ORDER — SODIUM CHLORIDE 0.9 % IV SOLN
INTRAVENOUS | Status: DC
  Filled 2018-11-03: qty 30

## 2018-11-03 MED ORDER — CHLORHEXIDINE GLUCONATE 0.12 % MT SOLN
15.0000 mL | Freq: Once | OROMUCOSAL | Status: AC
  Administered 2018-11-04: 15 mL via OROMUCOSAL
  Filled 2018-11-03: qty 15

## 2018-11-03 MED ORDER — MAGNESIUM SULFATE 50 % IJ SOLN
40.0000 meq | INTRAMUSCULAR | Status: DC
  Filled 2018-11-03: qty 9.85

## 2018-11-03 MED ORDER — TRANEXAMIC ACID (OHS) BOLUS VIA INFUSION
15.0000 mg/kg | INTRAVENOUS | Status: DC
  Administered 2018-11-04: 08:00:00 982.5 mg via INTRAVENOUS
  Filled 2018-11-03: qty 983

## 2018-11-03 MED ORDER — TEMAZEPAM 15 MG PO CAPS: 15.0000 mg | ORAL_CAPSULE | Freq: Once | ORAL | Status: DC | PRN

## 2018-11-03 MED ORDER — MILRINONE LACTATE IN DEXTROSE 20-5 MG/100ML-% IV SOLN
0.3000 ug/kg/min | INTRAVENOUS | Status: DC
  Filled 2018-11-03: qty 100

## 2018-11-03 MED ORDER — INSULIN REGULAR(HUMAN) IN NACL 100-0.9 UT/100ML-% IV SOLN
INTRAVENOUS | Status: DC
  Administered 2018-11-04: 08:00:00 5.4 [IU]/h via INTRAVENOUS
  Filled 2018-11-03: qty 100

## 2018-11-03 MED ORDER — POTASSIUM CHLORIDE 2 MEQ/ML IV SOLN
80.0000 meq | INTRAVENOUS | Status: DC
  Filled 2018-11-03: qty 40

## 2018-11-03 MED ORDER — NITROGLYCERIN IN D5W 200-5 MCG/ML-% IV SOLN
2.0000 ug/min | INTRAVENOUS | Status: DC
  Administered 2018-11-04: 16.6 ug/min via INTRAVENOUS
  Filled 2018-11-03: qty 250

## 2018-11-03 MED ORDER — PLASMA-LYTE 148 IV SOLN
INTRAVENOUS | Status: DC
  Filled 2018-11-03: qty 2.5

## 2018-11-03 MED ORDER — DOPAMINE-DEXTROSE 3.2-5 MG/ML-% IV SOLN
0.0000 ug/kg/min | INTRAVENOUS | Status: DC
  Filled 2018-11-03 (×2): qty 250

## 2018-11-03 MED ORDER — CHLORHEXIDINE GLUCONATE CLOTH 2 % EX PADS
6.0000 | MEDICATED_PAD | Freq: Once | CUTANEOUS | Status: AC
  Administered 2018-11-04: 6 via TOPICAL

## 2018-11-03 MED ORDER — TRANEXAMIC ACID (OHS) PUMP PRIME SOLUTION
2.0000 mg/kg | INTRAVENOUS | Status: DC
  Filled 2018-11-03: qty 1.31

## 2018-11-03 MED ORDER — VANCOMYCIN HCL 1000 MG IV SOLR
INTRAVENOUS | Status: DC
  Filled 2018-11-03: qty 1000

## 2018-11-03 MED ORDER — METOPROLOL TARTRATE 12.5 MG HALF TABLET
12.5000 mg | ORAL_TABLET | Freq: Once | ORAL | Status: AC
  Administered 2018-11-04: 12.5 mg via ORAL
  Filled 2018-11-03: qty 1

## 2018-11-03 MED ORDER — SODIUM CHLORIDE 0.9 % IV SOLN
750.0000 mg | INTRAVENOUS | Status: DC
  Filled 2018-11-03: qty 750

## 2018-11-03 MED ORDER — VANCOMYCIN HCL 10 G IV SOLR
1250.0000 mg | INTRAVENOUS | Status: DC
  Administered 2018-11-04: 08:00:00 1250 mg via INTRAVENOUS
  Filled 2018-11-03: qty 1250

## 2018-11-03 MED ORDER — NOREPINEPHRINE 4 MG/250ML-% IV SOLN
0.0000 ug/min | INTRAVENOUS | Status: DC
  Filled 2018-11-03: qty 250

## 2018-11-03 MED ORDER — DEXMEDETOMIDINE HCL IN NACL 400 MCG/100ML IV SOLN
0.1000 ug/kg/h | INTRAVENOUS | Status: DC
  Administered 2018-11-04: 08:00:00 .3 ug/kg/h via INTRAVENOUS
  Filled 2018-11-03: qty 100

## 2018-11-03 NOTE — Plan of Care (Signed)
  Problem: Coping: Goal: Level of anxiety will decrease Outcome: Progressing Note: Patient states that talking to family and friends on the phone calms her anxiety about her upcoming surgery.   Problem: Elimination: Goal: Will not experience complications related to urinary retention Outcome: Progressing Note: Voiding without difficulty.  No s/s of urinary retention noted.

## 2018-11-03 NOTE — Progress Notes (Signed)
Progress Note  Patient Name: Jennifer Hogan Date of Encounter: 11/03/2018  Primary Cardiologist: Fransico Him, MD   Subjective   She is doing well this morning denies chest pain, palpitations, shortness of breath.  She does feel her heart rate when she does any form of exercising or walks too quickly.  She has a strong family history of heart disease.  Her father passed away earlier this year.  Her mother has dementia. [place].  Inpatient Medications    Scheduled Meds: . aspirin EC  81 mg Oral Daily  . atorvastatin  80 mg Oral q1800  . Chlorhexidine Gluconate Cloth  6 each Topical Daily  . FLUoxetine  40 mg Oral Daily  . insulin pump   Subcutaneous TID AC, HS, 0200  . levothyroxine  100 mcg Oral Q0600  . montelukast  10 mg Oral QHS  . pantoprazole  40 mg Oral Daily  . sodium chloride flush  3 mL Intravenous Q12H   Continuous Infusions: . sodium chloride    . heparin 1,000 Units/hr (11/03/18 0549)   PRN Meds: sodium chloride, acetaminophen, ALPRAZolam, influenza vac split quadrivalent PF, magnesium hydroxide, nitroGLYCERIN, ondansetron (ZOFRAN) IV, pneumococcal 23 valent vaccine, sodium chloride flush   Vital Signs    Vitals:   11/02/18 0315 11/02/18 1301 11/02/18 2233 11/03/18 0357  BP: 106/66 (!) 114/94 (!) 147/83 111/78  Pulse: 80 87 89 88  Resp:      Temp: 98.2 F (36.8 C) 99.1 F (37.3 C) 98.6 F (37 C) 97.8 F (36.6 C)  TempSrc: Oral Oral Oral Oral  SpO2: 100% 100% 100% 99%  Weight: 66.6 kg   65.5 kg  Height:        Intake/Output Summary (Last 24 hours) at 11/03/2018 0927 Last data filed at 11/02/2018 2359 Gross per 24 hour  Intake 696 ml  Output -  Net 696 ml   Filed Weights   11/01/18 0400 11/02/18 0315 11/03/18 0357  Weight: 68 kg 66.6 kg 65.5 kg    Telemetry    Sinus tachycardia- Personally Reviewed  ECG    Sinus rhythm with nonspecific ST segment abnormalities and diffuse T wave inversions- Personally  Reviewed  Physical Exam   GEN: No acute distress.   Neck: No JVD Cardiac:  Mildly tachycardic, regular, no murmurs, rubs, or gallops.  Respiratory: Clear to auscultation bilaterally. GI: Soft, nontender, non-distended  MS: No edema; No deformity. Neuro:  Nonfocal  Psych: Normal affect   Labs    Chemistry Recent Labs  Lab 11/01/18 0419 11/02/18 0753 11/03/18 0446  NA 142 138 140  K 3.2* 3.9 3.9  CL 106 103 103  CO2 27 28 27   GLUCOSE 76 168* 132*  BUN 6 7 7   CREATININE 0.95 0.84 0.89  CALCIUM 8.7* 9.0 9.3  PROT  --  5.8*  --   ALBUMIN  --  3.0*  --   AST  --  24  --   ALT  --  22  --   ALKPHOS  --  51  --   BILITOT  --  0.2*  --   GFRNONAA >60 >60 >60  GFRAA >60 >60 >60  ANIONGAP 9 7 10      Hematology Recent Labs  Lab 11/01/18 0419 11/02/18 0344 11/03/18 0446  WBC 5.8 6.7 6.8  RBC 3.58* 3.58* 3.92  HGB 11.0* 11.0* 12.1  HCT 32.7* 31.9* 35.2*  MCV 91.3 89.1 89.8  MCH 30.7 30.7 30.9  MCHC  33.6 34.5 34.4  RDW 13.4 13.3 13.6  PLT 300 301 319    Cardiac EnzymesNo results for input(s): TROPONINI in the last 168 hours. No results for input(s): TROPIPOC in the last 168 hours.   BNPNo results for input(s): BNP, PROBNP in the last 168 hours.   DDimer No results for input(s): DDIMER in the last 168 hours.   Radiology    No results found.  Cardiac Studies    Ost Cx to Prox Cx lesion is 99% stenosed.  Ramus-2 lesion is 20% stenosed.  Ramus-1 lesion is 60% stenosed.  Ramus-3 lesion is 70% stenosed.  Prox LAD to Mid LAD lesion is 10% stenosed.  Previously placed Mid LAD to Dist LAD stent (unknown type) is widely patent.  Dist LAD-1 lesion is 99% stenosed.  Dist LAD-2 lesion is 99% stenosed.  There is mild to moderate left ventricular systolic dysfunction.  LV end diastolic pressure is normal.  The left ventricular ejection fraction is 35-45% by visual estimate.  There is no mitral valve regurgitation.  Mid LAD lesion is 60% stenosed.   Prox Cx to Mid Cx lesion is 70% stenosed.  1. Severe double vessel CAD 2. The LAD has a patent ostial/proximal stented segment with no restenosis. The distal stented segment has no restenosis. The mid vessel between stented segments has a moderately severe, eccentric stenosis. The small caliber, apical LAD has diffuse severe serial stenoses.  3. The intermediate branch (early diagonal) has a moderately severe ostial/proximal stenosis followed by a patent mid stented segment. The stented segment has moderate restenosis. Just beyond the stented segment there is a severe stenosis.  4. The Circumflex has an ostial/proximal stent that has severe restenosis within the stent. The mid Circumflex has a moderately severe stenosis.  5. The RCA is a large dominant artery with no obstructive disease 6. Moderate segmental LV systolic dysfunction. LVEF around 40%.   Recommendations: I have reviewed her films today with Dr. Martinique. Her culprit lesion is likely the ostial Circumflex stented segment that shows severe stent restenosis. Her disease has progressed in the intermediate branch and the LAD. She is a diabetic and given the aggressive nature of her disease with evidence of stent restenosis in a short period of time, I think we should consider bypass surgery. We will resume heparin 8 hours after the sheath pull. Will admit to the ICU. Will ask CT surgery to see her to discuss CABG. Plavix will be held.   Echo: IMPRESSIONS   1. Left ventricular ejection fraction, by visual estimation, is 45 to 50%. The left ventricle has mild to moderately decreased function. Left ventricular septal wall thickness was mildly increased. Normal left ventricular posterior wall thickness. There is mildly increased left ventricular hypertrophy. 2. Inferior septal and apical hypokinesis. 3. Global right ventricle has normal systolic function.The right ventricular size is normal. No increase in right ventricular wall  thickness. 4. Left atrial size was normal. 5. Right atrial size was normal. 6. The mitral valve is normal in structure. Mild mitral valve regurgitation. 7. The tricuspid valve is normal in structure. Tricuspid valve regurgitation is mild. 8. The aortic valve is normal in structure. Aortic valve regurgitation was not visualized by color flow Doppler. Mild aortic valve sclerosis without stenosis. 9. The pulmonic valve was grossly normal. Pulmonic valve regurgitation is mild by color flow Doppler. 10. Mildly elevated pulmonary artery systolic pressure.  Patient Profile     49 y.o. female with type 1 IDDM, HLD and CAD s/p multiple prior  stents in the last 2 years presents with NSTEMI.  Assessment & Plan    1.  Non-STEMI: Symptomatically stable.  Plan is for CABG on Monday.  Based on cath data she has critical restenosis in stent of ostial LCx. She also has significant stenosis in the ramus intermediate vessel and 60% mid LAD. Distal LAD disease is not suitable for percutaneous revascularization. Continue aspirin, atorvastatin, and IV heparin.  Soft blood pressures have limited beta-blockers and nitrates.  EF 45 to 50%.  2.  Anemia: Hemoglobin up to 12.1.  Currently on Protonix for prophylaxis.  3.  Hypercholesterolemia: On high-dose rosuvastatin.  4.  Ischemic cardiomyopathy/chronic systolic heart failure: LVEF 45 to 50%.  Unable to initiate optimal medications due to soft blood pressures.  No signs of hypervolemia.   For questions or updates, please contact [place] Please consult [email or web address] for contact info under Cardiology/STEMI.      Signed, Kate Sable, MD  11/03/2018, 9:27 AM

## 2018-11-03 NOTE — Progress Notes (Signed)
ANTICOAGULATION CONSULT NOTE - Follow-up Consult  Pharmacy Consult for Heparin Indication: chest pain/ACS  Allergies  Allergen Reactions  . Crestor [Rosuvastatin Calcium] Other (See Comments)    Severe myalgias and joint pain. Has tolerated atorvastatin though  . Peanut-Containing Drug Products Other (See Comments)    Vomiting, upset stomach and some wheezing  . Ciprofloxin Hcl [Ciprofloxacin] Nausea And Vomiting and Other (See Comments)    Severe migraine  . Food Color Red [Red Dye] Diarrhea    Patient Measurements: Height: 5\' 5"  (165.1 cm) Weight: 144 lb 4.8 oz (65.5 kg) IBW/kg (Calculated) : 57 Heparin Dosing Weight: 68  Vital Signs: Temp: 97.8 F (36.6 C) (10/11 0357) Temp Source: Oral (10/11 0357) BP: 111/78 (10/11 0357) Pulse Rate: 88 (10/11 0357)  Labs: Recent Labs    11/01/18 0419 11/02/18 0344 11/02/18 0753 11/03/18 0446  HGB 11.0* 11.0*  --  12.1  HCT 32.7* 31.9*  --  35.2*  PLT 300 301  --  319  HEPARINUNFRC 0.48 0.32  --  0.40  CREATININE 0.95  --  0.84 0.89    Estimated Creatinine Clearance: 68.8 mL/min (by C-G formula based on SCr of 0.89 mg/dL).   Medical History: Past Medical History:  Diagnosis Date  . Anemia   . Anxiety   . Coronary artery disease    a. s/pPTCA DES x3 in the LAD (ostial DES placed, mid DES placed, distal DES placed)and PTCA/DES x1to intermediate branch 02/2017. b. ACS 02/08/18 with LCx stent placement.  . Depression   . Diabetes mellitus    type 1, on insulin pump  . DKA (diabetic ketoacidoses) ([facility]) 12/31/2017  . Elevated platelet count   . High cholesterol   . Hypothyroidism   . Ischemic cardiomyopathy    a. EF low-normal with basal inferior akinesis, basal septal hypokinesis by echo 01/2018.     Assessment: 48 yo Hogan with a history of CAD and stent placement. Came in with heart pounding was the worst on Monday. She had chest aching on Sunday, 5-6/10. Worse w/ deep inspiration. Tried 1/2 tab isosorbide, no help.  Could not find SL NTG. Took metoprolol but BP was low. Monday, the pounding was worse and she had chest pain that started in the middle and spread. Felt like a burning pain, 8/10. Did not feel like any meds helped. She was hurting all over. Tried a hydrocodone, no help, threw it up. Felt a little better if she pressed on her chest. Today, she was extremely weak and came to the ER.   Pharmacy is consulted to dose heparin for ACS/NSTEMI. Trop 1138.  Patient was not on any anticoagulants PTA other than aspirin and Plavix. Plan is for CABG on Monday 10/12. Will need to hold Plavix for washout period.   Heparin level is therapeutic this morning at 0.40 on 1000 units/hr.  CBC stable. No active bleeding or line issues per nurse.  Goal of Therapy:  Heparin level 0.3-0.7 units/ml Monitor platelets by anticoagulation protocol: Yes   Plan:  Continue heparin infusion at 1000 units/hr Monitor daily heparin level and CBC Monitor for s/sx of bleeding  Kennon Holter, PharmD PGY1 Ambulatory Care Pharmacy Resident [employer] Phone: [phone] 11/03/2018,8:13 AM

## 2018-11-04 ENCOUNTER — Inpatient Hospital Stay (HOSPITAL_COMMUNITY)
Admission: EM | Disposition: A | Discharge status: Home / Self Care | Payer: Self-pay | Source: Home / Self Care | Attending: Cardiology

## 2018-11-04 ENCOUNTER — Inpatient Hospital Stay (HOSPITAL_COMMUNITY): Payer: Self-pay

## 2018-11-04 ENCOUNTER — Inpatient Hospital Stay (HOSPITAL_COMMUNITY): Payer: Self-pay | Admitting: Certified Registered Nurse Anesthetist

## 2018-11-04 DIAGNOSIS — I2510 Atherosclerotic heart disease of native coronary artery without angina pectoris: Secondary | ICD-10-CM

## 2018-11-04 DIAGNOSIS — I25110 Atherosclerotic heart disease of native coronary artery with unstable angina pectoris: Secondary | ICD-10-CM

## 2018-11-04 HISTORY — PX: CORONARY ARTERY BYPASS GRAFT: SHX141

## 2018-11-04 HISTORY — PX: TEE WITHOUT CARDIOVERSION: SHX5443

## 2018-11-04 LAB — BASIC METABOLIC PANEL
Anion gap: 12 (ref 5–15)
Anion gap: 10 (ref 5–15)
BUN: 8 mg/dL (ref 6–20)
BUN: 9 mg/dL (ref 6–20)
CO2: 25 mmol/L (ref 22–32)
CO2: 22 mmol/L (ref 22–32)
Calcium: 9.6 mg/dL (ref 8.9–10.3)
Calcium: 7.5 mg/dL — ABNORMAL LOW (ref 8.9–10.3)
Chloride: 101 mmol/L (ref 98–111)
Chloride: 108 mmol/L (ref 98–111)
Creatinine, Ser: 1.05 mg/dL — ABNORMAL HIGH (ref 0.44–1.00)
Creatinine, Ser: 0.97 mg/dL (ref 0.44–1.00)
GFR calc Af Amer: >60 mL/min (ref >60)
GFR calc Af Amer: >60 mL/min (ref >60)
GFR calc non Af Amer: >60 mL/min (ref >60)
GFR calc non Af Amer: >60 mL/min (ref >60)
Glucose, Bld: 238 mg/dL — ABNORMAL HIGH (ref 70–99)
Glucose, Bld: 213 mg/dL — ABNORMAL HIGH (ref 70–99)
Potassium: 4.2 mmol/L (ref 3.5–5.1)
Potassium: 3.9 mmol/L (ref 3.5–5.1)
Sodium: 138 mmol/L (ref 135–145)
Sodium: 140 mmol/L (ref 135–145)

## 2018-11-04 LAB — POCT I-STAT, CHEM 8
BUN: 9 mg/dL (ref 6–20)
BUN: 9 mg/dL (ref 6–20)
BUN: 9 mg/dL (ref 6–20)
BUN: 9 mg/dL (ref 6–20)
BUN: 7 mg/dL (ref 6–20)
BUN: 8 mg/dL (ref 6–20)
BUN: 8 mg/dL (ref 6–20)
BUN: 8 mg/dL (ref 6–20)
BUN: 8 mg/dL (ref 6–20)
Calcium, Ion: 1.19 mmol/L (ref 1.15–1.40)
Calcium, Ion: 1.16 mmol/L (ref 1.15–1.40)
Calcium, Ion: 1.22 mmol/L (ref 1.15–1.40)
Calcium, Ion: 1.23 mmol/L (ref 1.15–1.40)
Calcium, Ion: 0.94 mmol/L — ABNORMAL LOW (ref 1.15–1.40)
Calcium, Ion: 1.1 mmol/L — ABNORMAL LOW (ref 1.15–1.40)
Calcium, Ion: 1.13 mmol/L — ABNORMAL LOW (ref 1.15–1.40)
Calcium, Ion: 1.14 mmol/L — ABNORMAL LOW (ref 1.15–1.40)
Calcium, Ion: 1.08 mmol/L — ABNORMAL LOW (ref 1.15–1.40)
Chloride: 101 mmol/L (ref 98–111)
Chloride: 102 mmol/L (ref 98–111)
Chloride: 103 mmol/L (ref 98–111)
Chloride: 102 mmol/L (ref 98–111)
Chloride: 100 mmol/L (ref 98–111)
Chloride: 99 mmol/L (ref 98–111)
Chloride: 102 mmol/L (ref 98–111)
Chloride: 103 mmol/L (ref 98–111)
Chloride: 102 mmol/L (ref 98–111)
Creatinine, Ser: 0.6 mg/dL (ref 0.44–1.00)
Creatinine, Ser: 0.6 mg/dL (ref 0.44–1.00)
Creatinine, Ser: 0.6 mg/dL (ref 0.44–1.00)
Creatinine, Ser: 0.6 mg/dL (ref 0.44–1.00)
Creatinine, Ser: 0.5 mg/dL (ref 0.44–1.00)
Creatinine, Ser: 0.6 mg/dL (ref 0.44–1.00)
Creatinine, Ser: 0.6 mg/dL (ref 0.44–1.00)
Creatinine, Ser: 0.7 mg/dL (ref 0.44–1.00)
Creatinine, Ser: 0.6 mg/dL (ref 0.44–1.00)
Glucose, Bld: 329 mg/dL — ABNORMAL HIGH (ref 70–99)
Glucose, Bld: 265 mg/dL — ABNORMAL HIGH (ref 70–99)
Glucose, Bld: 317 mg/dL — ABNORMAL HIGH (ref 70–99)
Glucose, Bld: 206 mg/dL — ABNORMAL HIGH (ref 70–99)
Glucose, Bld: 127 mg/dL — ABNORMAL HIGH (ref 70–99)
Glucose, Bld: 149 mg/dL — ABNORMAL HIGH (ref 70–99)
Glucose, Bld: 112 mg/dL — ABNORMAL HIGH (ref 70–99)
Glucose, Bld: 97 mg/dL (ref 70–99)
Glucose, Bld: 97 mg/dL (ref 70–99)
HCT: 32 % — ABNORMAL LOW (ref 36.0–46.0)
HCT: 29 % — ABNORMAL LOW (ref 36.0–46.0)
HCT: 30 % — ABNORMAL LOW (ref 36.0–46.0)
HCT: 27 % — ABNORMAL LOW (ref 36.0–46.0)
HCT: 20 % — ABNORMAL LOW (ref 36.0–46.0)
HCT: 21 % — ABNORMAL LOW (ref 36.0–46.0)
HCT: 22 % — ABNORMAL LOW (ref 36.0–46.0)
HCT: 23 % — ABNORMAL LOW (ref 36.0–46.0)
HCT: 23 % — ABNORMAL LOW (ref 36.0–46.0)
Hemoglobin: 10.9 g/dL — ABNORMAL LOW (ref 12.0–15.0)
Hemoglobin: 10.2 g/dL — ABNORMAL LOW (ref 12.0–15.0)
Hemoglobin: 9.9 g/dL — ABNORMAL LOW (ref 12.0–15.0)
Hemoglobin: 9.2 g/dL — ABNORMAL LOW (ref 12.0–15.0)
Hemoglobin: 6.8 g/dL — CL (ref 12.0–15.0)
Hemoglobin: 7.1 g/dL — ABNORMAL LOW (ref 12.0–15.0)
Hemoglobin: 7.5 g/dL — ABNORMAL LOW (ref 12.0–15.0)
Hemoglobin: 7.8 g/dL — ABNORMAL LOW (ref 12.0–15.0)
Hemoglobin: 7.8 g/dL — ABNORMAL LOW (ref 12.0–15.0)
Potassium: 4.6 mmol/L (ref 3.5–5.1)
Potassium: 3.9 mmol/L (ref 3.5–5.1)
Potassium: 4.1 mmol/L (ref 3.5–5.1)
Potassium: 3.8 mmol/L (ref 3.5–5.1)
Potassium: 3.8 mmol/L (ref 3.5–5.1)
Potassium: 4.3 mmol/L (ref 3.5–5.1)
Potassium: 4.3 mmol/L (ref 3.5–5.1)
Potassium: 4.5 mmol/L (ref 3.5–5.1)
Potassium: 4.1 mmol/L (ref 3.5–5.1)
Sodium: 135 mmol/L (ref 135–145)
Sodium: 135 mmol/L (ref 135–145)
Sodium: 137 mmol/L (ref 135–145)
Sodium: 138 mmol/L (ref 135–145)
Sodium: 137 mmol/L (ref 135–145)
Sodium: 141 mmol/L (ref 135–145)
Sodium: 138 mmol/L (ref 135–145)
Sodium: 139 mmol/L (ref 135–145)
Sodium: 140 mmol/L (ref 135–145)
TCO2: 27 mmol/L (ref 22–32)
TCO2: 22 mmol/L (ref 22–32)
TCO2: 24 mmol/L (ref 22–32)
TCO2: 22 mmol/L (ref 22–32)
TCO2: 25 mmol/L (ref 22–32)
TCO2: 25 mmol/L (ref 22–32)
TCO2: 26 mmol/L (ref 22–32)
TCO2: 26 mmol/L (ref 22–32)
TCO2: 25 mmol/L (ref 22–32)

## 2018-11-04 LAB — APTT: aPTT: 31 seconds (ref 24–36)

## 2018-11-04 LAB — POCT I-STAT 7, (LYTES, BLD GAS, ICA,H+H)
Acid-Base Excess: 1 mmol/L (ref 0.0–2.0)
Acid-base deficit: 1 mmol/L (ref 0.0–2.0)
Acid-base deficit: 8 mmol/L — ABNORMAL HIGH (ref 0.0–2.0)
Acid-base deficit: 3 mmol/L — ABNORMAL HIGH (ref 0.0–2.0)
Acid-base deficit: 4 mmol/L — ABNORMAL HIGH (ref 0.0–2.0)
Bicarbonate: 25.1 mmol/L (ref 20.0–28.0)
Bicarbonate: 24.9 mmol/L (ref 20.0–28.0)
Bicarbonate: 24 mmol/L (ref 20.0–28.0)
Bicarbonate: 22.7 mmol/L (ref 20.0–28.0)
Bicarbonate: 18.5 mmol/L — ABNORMAL LOW (ref 20.0–28.0)
Bicarbonate: 22.8 mmol/L (ref 20.0–28.0)
Bicarbonate: 22 mmol/L (ref 20.0–28.0)
Calcium, Ion: 1.21 mmol/L (ref 1.15–1.40)
Calcium, Ion: 0.93 mmol/L — ABNORMAL LOW (ref 1.15–1.40)
Calcium, Ion: 1.09 mmol/L — ABNORMAL LOW (ref 1.15–1.40)
Calcium, Ion: 1.11 mmol/L — ABNORMAL LOW (ref 1.15–1.40)
Calcium, Ion: 1.13 mmol/L — ABNORMAL LOW (ref 1.15–1.40)
Calcium, Ion: 1.12 mmol/L — ABNORMAL LOW (ref 1.15–1.40)
Calcium, Ion: 1.17 mmol/L (ref 1.15–1.40)
HCT: 32 % — ABNORMAL LOW (ref 36.0–46.0)
HCT: 23 % — ABNORMAL LOW (ref 36.0–46.0)
HCT: 26 % — ABNORMAL LOW (ref 36.0–46.0)
HCT: 24 % — ABNORMAL LOW (ref 36.0–46.0)
HCT: 27 % — ABNORMAL LOW (ref 36.0–46.0)
HCT: 27 % — ABNORMAL LOW (ref 36.0–46.0)
HCT: 27 % — ABNORMAL LOW (ref 36.0–46.0)
Hemoglobin: 10.9 g/dL — ABNORMAL LOW (ref 12.0–15.0)
Hemoglobin: 7.8 g/dL — ABNORMAL LOW (ref 12.0–15.0)
Hemoglobin: 8.8 g/dL — ABNORMAL LOW (ref 12.0–15.0)
Hemoglobin: 8.2 g/dL — ABNORMAL LOW (ref 12.0–15.0)
Hemoglobin: 9.2 g/dL — ABNORMAL LOW (ref 12.0–15.0)
Hemoglobin: 9.2 g/dL — ABNORMAL LOW (ref 12.0–15.0)
Hemoglobin: 9.2 g/dL — ABNORMAL LOW (ref 12.0–15.0)
O2 Saturation: 100 %
O2 Saturation: 100 %
O2 Saturation: 100 %
O2 Saturation: 100 %
O2 Saturation: 99 %
O2 Saturation: 99 %
O2 Saturation: 99 %
Patient temperature: 37.5
Patient temperature: 37.8
Patient temperature: 37.4
Potassium: 4.5 mmol/L (ref 3.5–5.1)
Potassium: 3.8 mmol/L (ref 3.5–5.1)
Potassium: 4.2 mmol/L (ref 3.5–5.1)
Potassium: 4.1 mmol/L (ref 3.5–5.1)
Potassium: 4.2 mmol/L (ref 3.5–5.1)
Potassium: 3.9 mmol/L (ref 3.5–5.1)
Potassium: 3.6 mmol/L (ref 3.5–5.1)
Sodium: 136 mmol/L (ref 135–145)
Sodium: 141 mmol/L (ref 135–145)
Sodium: 141 mmol/L (ref 135–145)
Sodium: 141 mmol/L (ref 135–145)
Sodium: 139 mmol/L (ref 135–145)
Sodium: 142 mmol/L (ref 135–145)
Sodium: 142 mmol/L (ref 135–145)
TCO2: 26 mmol/L (ref 22–32)
TCO2: 26 mmol/L (ref 22–32)
TCO2: 25 mmol/L (ref 22–32)
TCO2: 24 mmol/L (ref 22–32)
TCO2: 20 mmol/L — ABNORMAL LOW (ref 22–32)
TCO2: 24 mmol/L (ref 22–32)
TCO2: 23 mmol/L (ref 22–32)
pCO2 arterial: 42.3 mmHg (ref 32.0–48.0)
pCO2 arterial: 35.3 mmHg (ref 32.0–48.0)
pCO2 arterial: 35.4 mmHg (ref 32.0–48.0)
pCO2 arterial: 34.8 mmHg (ref 32.0–48.0)
pCO2 arterial: 40.7 mmHg (ref 32.0–48.0)
pCO2 arterial: 43.5 mmHg (ref 32.0–48.0)
pCO2 arterial: 42.2 mmHg (ref 32.0–48.0)
pH, Arterial: 7.382 (ref 7.350–7.450)
pH, Arterial: 7.457 — ABNORMAL HIGH (ref 7.350–7.450)
pH, Arterial: 7.438 (ref 7.350–7.450)
pH, Arterial: 7.422 (ref 7.350–7.450)
pH, Arterial: 7.268 — ABNORMAL LOW (ref 7.350–7.450)
pH, Arterial: 7.332 — ABNORMAL LOW (ref 7.350–7.450)
pH, Arterial: 7.327 — ABNORMAL LOW (ref 7.350–7.450)
pO2, Arterial: 501 mmHg — ABNORMAL HIGH (ref 83.0–108.0)
pO2, Arterial: 291 mmHg — ABNORMAL HIGH (ref 83.0–108.0)
pO2, Arterial: 273 mmHg — ABNORMAL HIGH (ref 83.0–108.0)
pO2, Arterial: 166 mmHg — ABNORMAL HIGH (ref 83.0–108.0)
pO2, Arterial: 139 mmHg — ABNORMAL HIGH (ref 83.0–108.0)
pO2, Arterial: 145 mmHg — ABNORMAL HIGH (ref 83.0–108.0)
pO2, Arterial: 146 mmHg — ABNORMAL HIGH (ref 83.0–108.0)

## 2018-11-04 LAB — GLUCOSE, CAPILLARY
Glucose-Capillary: 147 mg/dL — ABNORMAL HIGH (ref 70–99)
Glucose-Capillary: 228 mg/dL — ABNORMAL HIGH (ref 70–99)
Glucose-Capillary: 52 mg/dL — ABNORMAL LOW (ref 70–99)
Glucose-Capillary: 66 mg/dL — ABNORMAL LOW (ref 70–99)
Glucose-Capillary: 173 mg/dL — ABNORMAL HIGH (ref 70–99)
Glucose-Capillary: 299 mg/dL — ABNORMAL HIGH (ref 70–99)
Glucose-Capillary: 293 mg/dL — ABNORMAL HIGH (ref 70–99)
Glucose-Capillary: 245 mg/dL — ABNORMAL HIGH (ref 70–99)
Glucose-Capillary: 215 mg/dL — ABNORMAL HIGH (ref 70–99)
Glucose-Capillary: 182 mg/dL — ABNORMAL HIGH (ref 70–99)

## 2018-11-04 LAB — CBC
HCT: 38 % (ref 36.0–46.0)
HCT: 24.3 % — ABNORMAL LOW (ref 36.0–46.0)
HCT: 28.5 % — ABNORMAL LOW (ref 36.0–46.0)
Hemoglobin: 12.6 g/dL (ref 12.0–15.0)
Hemoglobin: 8.4 g/dL — ABNORMAL LOW (ref 12.0–15.0)
Hemoglobin: 9.3 g/dL — ABNORMAL LOW (ref 12.0–15.0)
MCH: 30.5 pg (ref 26.0–34.0)
MCH: 31.5 pg (ref 26.0–34.0)
MCH: 29.7 pg (ref 26.0–34.0)
MCHC: 33.2 g/dL (ref 30.0–36.0)
MCHC: 34.6 g/dL (ref 30.0–36.0)
MCHC: 32.6 g/dL (ref 30.0–36.0)
MCV: 92 fL (ref 80.0–100.0)
MCV: 91 fL (ref 80.0–100.0)
MCV: 91.1 fL (ref 80.0–100.0)
Platelets: 385 10*3/uL (ref 150–400)
Platelets: 142 10*3/uL — ABNORMAL LOW (ref 150–400)
Platelets: 181 10*3/uL (ref 150–400)
RBC: 4.13 MIL/uL (ref 3.87–5.11)
RBC: 2.67 MIL/uL — ABNORMAL LOW (ref 3.87–5.11)
RBC: 3.13 MIL/uL — ABNORMAL LOW (ref 3.87–5.11)
RDW: 13.9 % (ref 11.5–15.5)
RDW: 13.8 % (ref 11.5–15.5)
RDW: 14.5 % (ref 11.5–15.5)
WBC: 6.5 10*3/uL (ref 4.0–10.5)
WBC: 4.3 10*3/uL (ref 4.0–10.5)
WBC: 8 10*3/uL (ref 4.0–10.5)
nRBC: 0 % (ref 0.0–0.2)
nRBC: 0 % (ref 0.0–0.2)
nRBC: 0 % (ref 0.0–0.2)

## 2018-11-04 LAB — PREPARE RBC (CROSSMATCH)

## 2018-11-04 LAB — HEMOGLOBIN AND HEMATOCRIT, BLOOD
HCT: 23.1 % — ABNORMAL LOW (ref 36.0–46.0)
Hemoglobin: 7.7 g/dL — ABNORMAL LOW (ref 12.0–15.0)

## 2018-11-04 LAB — PROTIME-INR
INR: 1.4 — ABNORMAL HIGH (ref 0.8–1.2)
Prothrombin Time: 17 seconds — ABNORMAL HIGH (ref 11.4–15.2)

## 2018-11-04 LAB — MAGNESIUM: Magnesium: 3.1 mg/dL — ABNORMAL HIGH (ref 1.7–2.4)

## 2018-11-04 LAB — PLATELET COUNT: Platelets: 215 10*3/uL (ref 150–400)

## 2018-11-04 LAB — HEPARIN LEVEL (UNFRACTIONATED): Heparin Unfractionated: 0.57 IU/mL (ref 0.30–0.70)

## 2018-11-04 SURGERY — CORONARY ARTERY BYPASS GRAFTING (CABG)
Anesthesia: General | Site: Chest

## 2018-11-04 MED ORDER — AMIODARONE IV BOLUS ONLY 150 MG/100ML
INTRAVENOUS | Status: DC | PRN
  Administered 2018-11-04: 150 mg via INTRAVENOUS

## 2018-11-04 MED ORDER — ACETAMINOPHEN 160 MG/5ML PO SOLN: 650.0000 mg | Freq: Once | ORAL | Status: AC

## 2018-11-04 MED ORDER — HEPARIN SODIUM (PORCINE) 1000 UNIT/ML IJ SOLN
INTRAMUSCULAR | Status: DC | PRN
  Administered 2018-11-04: 5000 [IU] via INTRAVENOUS
  Administered 2018-11-04: 18000 [IU] via INTRAVENOUS

## 2018-11-04 MED ORDER — ARTIFICIAL TEARS OPHTHALMIC OINT
TOPICAL_OINTMENT | OPHTHALMIC | Status: DC | PRN
  Administered 2018-11-04: 1 via OPHTHALMIC

## 2018-11-04 MED ORDER — TRAMADOL HCL 50 MG PO TABS
50.0000 mg | ORAL_TABLET | ORAL | Status: DC | PRN
  Administered 2018-11-05 – 2018-11-09 (×8): 100 mg via ORAL
  Filled 2018-11-04 (×8): qty 2

## 2018-11-04 MED ORDER — AMIODARONE HCL IN DEXTROSE 360-4.14 MG/200ML-% IV SOLN
INTRAVENOUS | Status: DC | PRN
  Administered 2018-11-04: 60 mg/h via INTRAVENOUS

## 2018-11-04 MED ORDER — CHLORHEXIDINE GLUCONATE 0.12% ORAL RINSE (MEDLINE KIT)
15.0000 mL | Freq: Two times a day (BID) | OROMUCOSAL | Status: DC
  Administered 2018-11-04 – 2018-11-06 (×4): 15 mL via OROMUCOSAL

## 2018-11-04 MED ORDER — ORAL CARE MOUTH RINSE
15.0000 mL | Freq: Two times a day (BID) | OROMUCOSAL | Status: DC
  Administered 2018-11-04 – 2018-11-09 (×3): 15 mL via OROMUCOSAL

## 2018-11-04 MED ORDER — SODIUM CHLORIDE 0.9 % IV SOLN
INTRAVENOUS | Status: DC | PRN
  Administered 2018-11-04: 13:00:00 via INTRAVENOUS

## 2018-11-04 MED ORDER — SODIUM CHLORIDE 0.9 % IV SOLN
INTRAVENOUS | Status: DC
  Administered 2018-11-04: 14:00:00 via INTRAVENOUS

## 2018-11-04 MED ORDER — SODIUM BICARBONATE 8.4 % IV SOLN
100.0000 meq | Freq: Once | INTRAVENOUS | Status: AC
  Administered 2018-11-04: 19:00:00 100 meq via INTRAVENOUS

## 2018-11-04 MED ORDER — INSULIN REGULAR(HUMAN) IN NACL 100-0.9 UT/100ML-% IV SOLN
INTRAVENOUS | Status: DC
  Administered 2018-11-04: 2.4 [IU]/h via INTRAVENOUS
  Administered 2018-11-04: 3.9 [IU]/h via INTRAVENOUS
  Filled 2018-11-04: qty 100

## 2018-11-04 MED ORDER — FAMOTIDINE IN NACL 20-0.9 MG/50ML-% IV SOLN
20.0000 mg | Freq: Two times a day (BID) | INTRAVENOUS | Status: AC
  Administered 2018-11-04 (×2): 20 mg via INTRAVENOUS
  Filled 2018-11-04: qty 50

## 2018-11-04 MED ORDER — LACTATED RINGERS IV SOLN
INTRAVENOUS | Status: DC | PRN
  Administered 2018-11-04: 07:00:00 via INTRAVENOUS

## 2018-11-04 MED ORDER — FENTANYL CITRATE (PF) 250 MCG/5ML IJ SOLN
INTRAMUSCULAR | Status: AC
  Filled 2018-11-04: qty 5

## 2018-11-04 MED ORDER — 0.9 % SODIUM CHLORIDE (POUR BTL) OPTIME
TOPICAL | Status: DC | PRN
  Administered 2018-11-04: 5000 mL

## 2018-11-04 MED ORDER — PANTOPRAZOLE SODIUM 40 MG PO TBEC
40.0000 mg | DELAYED_RELEASE_TABLET | Freq: Every day | ORAL | Status: DC
  Administered 2018-11-06 – 2018-11-09 (×4): 40 mg via ORAL
  Filled 2018-11-04 (×4): qty 1

## 2018-11-04 MED ORDER — CHLORHEXIDINE GLUCONATE 0.12 % MT SOLN
15.0000 mL | OROMUCOSAL | Status: AC
  Administered 2018-11-04: 15 mL via OROMUCOSAL

## 2018-11-04 MED ORDER — BUPIVACAINE LIPOSOME 1.3 % IJ SUSP
20.0000 mL | Freq: Once | INTRAMUSCULAR | Status: DC
  Filled 2018-11-04: qty 20

## 2018-11-04 MED ORDER — DEXMEDETOMIDINE HCL IN NACL 400 MCG/100ML IV SOLN
0.0000 ug/kg/h | INTRAVENOUS | Status: DC
  Administered 2018-11-04: 0.5 ug/kg/h via INTRAVENOUS
  Filled 2018-11-04: qty 100

## 2018-11-04 MED ORDER — METOPROLOL TARTRATE 5 MG/5ML IV SOLN: 2.5000 mg | INTRAVENOUS | Status: DC | PRN

## 2018-11-04 MED ORDER — SODIUM CHLORIDE 0.9% IV SOLUTION
Freq: Once | INTRAVENOUS | Status: AC
  Administered 2018-11-04: 15:00:00 via INTRAVENOUS

## 2018-11-04 MED ORDER — VANCOMYCIN HCL 1000 MG IV SOLR
INTRAVENOUS | Status: DC | PRN
  Administered 2018-11-04: 1000 mL

## 2018-11-04 MED ORDER — LACTATED RINGERS IV SOLN: INTRAVENOUS | Status: DC

## 2018-11-04 MED ORDER — ACETAMINOPHEN 160 MG/5ML PO SOLN: 1000.0000 mg | Freq: Four times a day (QID) | ORAL | Status: DC

## 2018-11-04 MED ORDER — STERILE WATER FOR INJECTION IJ SOLN
INTRAMUSCULAR | Status: AC
  Filled 2018-11-04: qty 10

## 2018-11-04 MED ORDER — FENTANYL CITRATE (PF) 250 MCG/5ML IJ SOLN
INTRAMUSCULAR | Status: DC | PRN
  Administered 2018-11-04: 150 ug via INTRAVENOUS
  Administered 2018-11-04: 50 ug via INTRAVENOUS
  Administered 2018-11-04: 100 ug via INTRAVENOUS
  Administered 2018-11-04: 50 ug via INTRAVENOUS
  Administered 2018-11-04: 100 ug via INTRAVENOUS
  Administered 2018-11-04: 50 ug via INTRAVENOUS
  Administered 2018-11-04: 850 ug via INTRAVENOUS

## 2018-11-04 MED ORDER — ALBUMIN HUMAN 5 % IV SOLN
250.0000 mL | INTRAVENOUS | Status: AC | PRN
  Administered 2018-11-04 (×3): 12.5 g via INTRAVENOUS
  Filled 2018-11-04: qty 500

## 2018-11-04 MED ORDER — LACTATED RINGERS IV SOLN
INTRAVENOUS | Status: DC | PRN
  Administered 2018-11-04 (×2): via INTRAVENOUS

## 2018-11-04 MED ORDER — SODIUM CHLORIDE 0.9% FLUSH
3.0000 mL | Freq: Two times a day (BID) | INTRAVENOUS | Status: DC
  Administered 2018-11-05 – 2018-11-07 (×5): 3 mL via INTRAVENOUS
  Administered 2018-11-07: 11:00:00 via INTRAVENOUS
  Administered 2018-11-08 – 2018-11-09 (×2): 3 mL via INTRAVENOUS

## 2018-11-04 MED ORDER — SODIUM CHLORIDE 0.9 % IV SOLN
INTRAVENOUS | Status: DC | PRN
  Administered 2018-11-04: 12:00:00 750 mg via INTRAVENOUS

## 2018-11-04 MED ORDER — SODIUM CHLORIDE 0.9% FLUSH: 3.0000 mL | INTRAVENOUS | Status: DC | PRN

## 2018-11-04 MED ORDER — BUPIVACAINE LIPOSOME 1.3 % IJ SUSP
INTRAMUSCULAR | Status: DC | PRN
  Administered 2018-11-04: 50 mL

## 2018-11-04 MED ORDER — OXYCODONE HCL 5 MG PO TABS: 5.0000 mg | ORAL_TABLET | ORAL | Status: DC | PRN

## 2018-11-04 MED ORDER — VANCOMYCIN HCL IN DEXTROSE 1-5 GM/200ML-% IV SOLN
1000.0000 mg | Freq: Once | INTRAVENOUS | Status: AC
  Administered 2018-11-04: 1000 mg via INTRAVENOUS
  Filled 2018-11-04: qty 200

## 2018-11-04 MED ORDER — SODIUM CHLORIDE 0.9% FLUSH
10.0000 mL | Freq: Two times a day (BID) | INTRAVENOUS | Status: DC
  Administered 2018-11-04 – 2018-11-06 (×3): 10 mL

## 2018-11-04 MED ORDER — LACTATED RINGERS IV SOLN: 500.0000 mL | Freq: Once | INTRAVENOUS | Status: DC | PRN

## 2018-11-04 MED ORDER — MORPHINE SULFATE (PF) 2 MG/ML IV SOLN
1.0000 mg | INTRAVENOUS | Status: DC | PRN
  Administered 2018-11-04 – 2018-11-05 (×3): 2 mg via INTRAVENOUS
  Filled 2018-11-04 (×3): qty 1

## 2018-11-04 MED ORDER — MAGNESIUM SULFATE 4 GM/100ML IV SOLN
4.0000 g | Freq: Once | INTRAVENOUS | Status: AC
  Administered 2018-11-04: 4 g via INTRAVENOUS
  Filled 2018-11-04: qty 100

## 2018-11-04 MED ORDER — SODIUM CHLORIDE 0.45 % IV SOLN: INTRAVENOUS | Status: DC | PRN

## 2018-11-04 MED ORDER — ASPIRIN 81 MG PO CHEW: 324.0000 mg | CHEWABLE_TABLET | Freq: Every day | ORAL | Status: DC

## 2018-11-04 MED ORDER — ARTIFICIAL TEARS OPHTHALMIC OINT
TOPICAL_OINTMENT | OPHTHALMIC | Status: AC
  Filled 2018-11-04: qty 3.5

## 2018-11-04 MED ORDER — HEPARIN SODIUM (PORCINE) 1000 UNIT/ML IJ SOLN
INTRAMUSCULAR | Status: AC
  Filled 2018-11-04: qty 1

## 2018-11-04 MED ORDER — PROPOFOL 10 MG/ML IV BOLUS
INTRAVENOUS | Status: AC
  Filled 2018-11-04: qty 20

## 2018-11-04 MED ORDER — PHENYLEPHRINE HCL-NACL 20-0.9 MG/250ML-% IV SOLN
0.0000 ug/min | INTRAVENOUS | Status: DC
  Administered 2018-11-04: 45 ug/min via INTRAVENOUS
  Administered 2018-11-05: 08:00:00 30 ug/min via INTRAVENOUS
  Filled 2018-11-04 (×2): qty 250

## 2018-11-04 MED ORDER — FENTANYL CITRATE (PF) 250 MCG/5ML IJ SOLN
INTRAMUSCULAR | Status: AC
  Filled 2018-11-04: qty 25

## 2018-11-04 MED ORDER — PHENYLEPHRINE 40 MCG/ML (10ML) SYRINGE FOR IV PUSH (FOR BLOOD PRESSURE SUPPORT)
PREFILLED_SYRINGE | INTRAVENOUS | Status: DC | PRN
  Administered 2018-11-04: 40 ug via INTRAVENOUS
  Administered 2018-11-04: 120 ug via INTRAVENOUS
  Administered 2018-11-04 (×3): 80 ug via INTRAVENOUS
  Administered 2018-11-04: 120 ug via INTRAVENOUS

## 2018-11-04 MED ORDER — ORAL CARE MOUTH RINSE
15.0000 mL | OROMUCOSAL | Status: DC
  Administered 2018-11-04 (×2): 15 mL via OROMUCOSAL

## 2018-11-04 MED ORDER — ACETAMINOPHEN 650 MG RE SUPP
650.0000 mg | Freq: Once | RECTAL | Status: AC
  Administered 2018-11-04: 14:00:00 650 mg via RECTAL

## 2018-11-04 MED ORDER — DEXTROSE 50 % IV SOLN
25.0000 mL | Freq: Once | INTRAVENOUS | Status: AC
  Administered 2018-11-04: 14:00:00 25 mL via INTRAVENOUS

## 2018-11-04 MED ORDER — SODIUM CHLORIDE (PF) 0.9 % IJ SOLN
OROMUCOSAL | Status: DC | PRN
  Administered 2018-11-04 (×3): 4 mL via TOPICAL

## 2018-11-04 MED ORDER — BUPIVACAINE HCL (PF) 0.5 % IJ SOLN
INTRAMUSCULAR | Status: AC
  Filled 2018-11-04: qty 30

## 2018-11-04 MED ORDER — SODIUM CHLORIDE 0.9 % IV SOLN: 250.0000 mL | INTRAVENOUS | Status: DC

## 2018-11-04 MED ORDER — SODIUM CHLORIDE 0.9 % IV SOLN
1.5000 g | Freq: Two times a day (BID) | INTRAVENOUS | Status: AC
  Administered 2018-11-04 – 2018-11-06 (×4): 1.5 g via INTRAVENOUS
  Filled 2018-11-04 (×4): qty 1.5

## 2018-11-04 MED ORDER — SODIUM CHLORIDE 0.9% FLUSH: 10.0000 mL | INTRAVENOUS | Status: DC | PRN

## 2018-11-04 MED ORDER — ROCURONIUM BROMIDE 10 MG/ML (PF) SYRINGE
PREFILLED_SYRINGE | INTRAVENOUS | Status: AC
  Filled 2018-11-04: qty 20

## 2018-11-04 MED ORDER — NON FORMULARY
Status: DC | PRN
  Administered 2018-11-04: 20 mL

## 2018-11-04 MED ORDER — BISACODYL 5 MG PO TBEC
10.0000 mg | DELAYED_RELEASE_TABLET | Freq: Every day | ORAL | Status: DC
  Administered 2018-11-05 – 2018-11-07 (×3): 10 mg via ORAL
  Filled 2018-11-04 (×5): qty 2

## 2018-11-04 MED ORDER — POTASSIUM CHLORIDE 10 MEQ/50ML IV SOLN
10.0000 meq | INTRAVENOUS | Status: AC
  Administered 2018-11-04 – 2018-11-05 (×3): 10 meq via INTRAVENOUS
  Filled 2018-11-04 (×3): qty 50

## 2018-11-04 MED ORDER — PROTAMINE SULFATE 10 MG/ML IV SOLN
INTRAVENOUS | Status: AC
  Filled 2018-11-04: qty 25

## 2018-11-04 MED ORDER — INSULIN REGULAR BOLUS VIA INFUSION
0.0000 [IU] | Freq: Three times a day (TID) | INTRAVENOUS | Status: DC
  Filled 2018-11-04: qty 10

## 2018-11-04 MED ORDER — MIDAZOLAM HCL 5 MG/5ML IJ SOLN
INTRAMUSCULAR | Status: DC | PRN
  Administered 2018-11-04: 1 mg via INTRAVENOUS
  Administered 2018-11-04: 2 mg via INTRAVENOUS
  Administered 2018-11-04 (×2): 1 mg via INTRAVENOUS
  Administered 2018-11-04: 2 mg via INTRAVENOUS
  Administered 2018-11-04: 1 mg via INTRAVENOUS
  Administered 2018-11-04: 2 mg via INTRAVENOUS

## 2018-11-04 MED ORDER — NITROGLYCERIN IN D5W 200-5 MCG/ML-% IV SOLN: 0.0000 ug/min | INTRAVENOUS | Status: DC

## 2018-11-04 MED ORDER — ONDANSETRON HCL 4 MG/2ML IJ SOLN
4.0000 mg | Freq: Four times a day (QID) | INTRAMUSCULAR | Status: DC | PRN
  Administered 2018-11-04 – 2018-11-06 (×6): 4 mg via INTRAVENOUS
  Filled 2018-11-04 (×6): qty 2

## 2018-11-04 MED ORDER — PLASMA-LYTE 148 IV SOLN
INTRAVENOUS | Status: DC | PRN
  Administered 2018-11-04: 500 mL via INTRAVASCULAR

## 2018-11-04 MED ORDER — MIDAZOLAM HCL 2 MG/2ML IJ SOLN: 2.0000 mg | INTRAMUSCULAR | Status: DC | PRN

## 2018-11-04 MED ORDER — PHENYLEPHRINE 40 MCG/ML (10ML) SYRINGE FOR IV PUSH (FOR BLOOD PRESSURE SUPPORT)
PREFILLED_SYRINGE | INTRAVENOUS | Status: AC
  Filled 2018-11-04: qty 10

## 2018-11-04 MED ORDER — METOPROLOL TARTRATE 25 MG/10 ML ORAL SUSPENSION
12.5000 mg | Freq: Two times a day (BID) | ORAL | Status: DC
  Administered 2018-11-07: 12.5 mg
  Filled 2018-11-04 (×5): qty 5

## 2018-11-04 MED ORDER — PROTAMINE SULFATE 10 MG/ML IV SOLN
INTRAVENOUS | Status: DC | PRN
  Administered 2018-11-04: 230 mg via INTRAVENOUS

## 2018-11-04 MED ORDER — LACTATED RINGERS IV SOLN
INTRAVENOUS | Status: DC
  Administered 2018-11-04: 20 mL/h via INTRAVENOUS
  Administered 2018-11-06: 23:00:00 via INTRAVENOUS

## 2018-11-04 MED ORDER — POTASSIUM CHLORIDE 10 MEQ/50ML IV SOLN: 10.0000 meq | INTRAVENOUS | Status: AC

## 2018-11-04 MED ORDER — ACETAMINOPHEN 500 MG PO TABS
1000.0000 mg | ORAL_TABLET | Freq: Four times a day (QID) | ORAL | Status: DC
  Administered 2018-11-04 – 2018-11-09 (×17): 1000 mg via ORAL
  Filled 2018-11-04 (×18): qty 2

## 2018-11-04 MED ORDER — ASPIRIN EC 325 MG PO TBEC
325.0000 mg | DELAYED_RELEASE_TABLET | Freq: Every day | ORAL | Status: DC
  Administered 2018-11-05: 325 mg via ORAL
  Filled 2018-11-04: qty 1

## 2018-11-04 MED ORDER — PROPOFOL 10 MG/ML IV BOLUS
INTRAVENOUS | Status: DC | PRN
  Administered 2018-11-04: 50 mg via INTRAVENOUS

## 2018-11-04 MED ORDER — ALBUMIN HUMAN 5 % IV SOLN
INTRAVENOUS | Status: DC | PRN
  Administered 2018-11-04 (×3): via INTRAVENOUS

## 2018-11-04 MED ORDER — BISACODYL 10 MG RE SUPP: 10.0000 mg | Freq: Every day | RECTAL | Status: DC

## 2018-11-04 MED ORDER — METOPROLOL TARTRATE 12.5 MG HALF TABLET
12.5000 mg | ORAL_TABLET | Freq: Two times a day (BID) | ORAL | Status: DC
  Administered 2018-11-04 – 2018-11-09 (×7): 12.5 mg via ORAL
  Filled 2018-11-04 (×9): qty 1

## 2018-11-04 MED ORDER — ROCURONIUM BROMIDE 10 MG/ML (PF) SYRINGE
PREFILLED_SYRINGE | INTRAVENOUS | Status: DC | PRN
  Administered 2018-11-04: 50 mg via INTRAVENOUS
  Administered 2018-11-04: 30 mg via INTRAVENOUS
  Administered 2018-11-04: 50 mg via INTRAVENOUS
  Administered 2018-11-04: 70 mg via INTRAVENOUS

## 2018-11-04 MED ORDER — AMIODARONE HCL IN DEXTROSE 360-4.14 MG/200ML-% IV SOLN
30.0000 mg/h | INTRAVENOUS | Status: DC
  Administered 2018-11-04 – 2018-11-06 (×5): 30 mg/h via INTRAVENOUS
  Filled 2018-11-04 (×5): qty 200

## 2018-11-04 MED ORDER — AMIODARONE HCL IN DEXTROSE 360-4.14 MG/200ML-% IV SOLN
60.0000 mg/h | INTRAVENOUS | Status: DC
  Filled 2018-11-04: qty 200

## 2018-11-04 MED ORDER — MIDAZOLAM HCL (PF) 10 MG/2ML IJ SOLN
INTRAMUSCULAR | Status: AC
  Filled 2018-11-04: qty 2

## 2018-11-04 MED ORDER — DEXTROSE 50 % IV SOLN
INTRAVENOUS | Status: AC
  Administered 2018-11-04: 14:00:00 25 mL via INTRAVENOUS
  Filled 2018-11-04: qty 50

## 2018-11-04 MED ORDER — EPINEPHRINE HCL 5 MG/250ML IV SOLN IN NS
4.0000 ug/min | INTRAVENOUS | Status: DC
  Administered 2018-11-04: 2 ug/min via INTRAVENOUS

## 2018-11-04 MED ORDER — DEXMEDETOMIDINE HCL IN NACL 200 MCG/50ML IV SOLN: 0.0000 ug/kg/h | INTRAVENOUS | Status: DC

## 2018-11-04 MED ORDER — DOCUSATE SODIUM 100 MG PO CAPS
200.0000 mg | ORAL_CAPSULE | Freq: Every day | ORAL | Status: DC
  Administered 2018-11-05 – 2018-11-07 (×3): 200 mg via ORAL
  Filled 2018-11-04 (×5): qty 2

## 2018-11-04 SURGICAL SUPPLY — 75 items
ADAPTER CARDIO PERF ANTE/RETRO (ADAPTER) ×3 IMPLANT
ADH SKN CLS APL DERMABOND .7 (GAUZE/BANDAGES/DRESSINGS) ×2
ADPR PRFSN 84XANTGRD RTRGD (ADAPTER) ×2
BAG DECANTER FOR FLEXI CONT (MISCELLANEOUS) ×3 IMPLANT
BASKET HEART (ORDER IN 25'S) (MISCELLANEOUS) ×1
BASKET HEART (ORDER IN 25S) (MISCELLANEOUS) ×2 IMPLANT
BLADE CLIPPER SURG (BLADE) ×3 IMPLANT
BLADE STERNUM SYSTEM 6 (BLADE) ×3 IMPLANT
BNDG ELASTIC 4X5.8 VLCR STR LF (GAUZE/BANDAGES/DRESSINGS) ×3 IMPLANT
BNDG ELASTIC 6X5.8 VLCR STR LF (GAUZE/BANDAGES/DRESSINGS) ×3 IMPLANT
BNDG GAUZE ELAST 4 BULKY (GAUZE/BANDAGES/DRESSINGS) ×3 IMPLANT
CANISTER SUCT 3000ML PPV (MISCELLANEOUS) ×3 IMPLANT
CANISTER WOUNDNEG PRESSURE 500 (CANNISTER) ×1 IMPLANT
CATH CPB KIT HENDRICKSON (MISCELLANEOUS) ×3 IMPLANT
CATH ROBINSON RED A/P 18FR (CATHETERS) ×6 IMPLANT
CLIP RETRACTION 3.0MM CORONARY (MISCELLANEOUS) ×3 IMPLANT
DERMABOND ADVANCED (GAUZE/BANDAGES/DRESSINGS) ×1
DERMABOND ADVANCED .7 DNX12 (GAUZE/BANDAGES/DRESSINGS) ×2 IMPLANT
DRAIN CHANNEL 28F RND 3/8 FF (WOUND CARE) ×9 IMPLANT
DRAPE CARDIOVASCULAR INCISE (DRAPES) ×3
DRAPE SLUSH/WARMER DISC (DRAPES) ×3 IMPLANT
DRAPE SRG 135X102X78XABS (DRAPES) ×2 IMPLANT
DRESSING PREVENA PLUS CUSTOM (GAUZE/BANDAGES/DRESSINGS) IMPLANT
DRSG AQUACEL AG ADV 3.5X14 (GAUZE/BANDAGES/DRESSINGS) ×3 IMPLANT
DRSG PREVENA PLUS CUSTOM (GAUZE/BANDAGES/DRESSINGS) ×3
ELECT CAUTERY BLADE 6.4 (BLADE) ×3 IMPLANT
ELECT REM PT RETURN 9FT ADLT (ELECTROSURGICAL) ×6
ELECTRODE REM PT RTRN 9FT ADLT (ELECTROSURGICAL) ×4 IMPLANT
FELT TEFLON 1X6 (MISCELLANEOUS) ×5 IMPLANT
GAUZE SPONGE 4X4 12PLY STRL (GAUZE/BANDAGES/DRESSINGS) ×5 IMPLANT
GLOVE NEODERM STRL 7.5 LF PF (GLOVE) ×6 IMPLANT
GLOVE SURG NEODERM 7.5  LF PF (GLOVE) ×3
GOWN STRL REUS W/ TWL LRG LVL3 (GOWN DISPOSABLE) ×8 IMPLANT
GOWN STRL REUS W/TWL LRG LVL3 (GOWN DISPOSABLE) ×12
HEMOSTAT POWDER SURGIFOAM 1G (HEMOSTASIS) ×9 IMPLANT
HEMOSTAT SURGICEL 2X14 (HEMOSTASIS) ×3 IMPLANT
KIT BASIN OR (CUSTOM PROCEDURE TRAY) ×3 IMPLANT
KIT SUCTION CATH 14FR (SUCTIONS) ×3 IMPLANT
KIT TURNOVER KIT B (KITS) ×3 IMPLANT
KIT VASOVIEW HEMOPRO 2 VH 4000 (KITS) ×3 IMPLANT
LEAD PACING MYOCARDI (MISCELLANEOUS) ×3 IMPLANT
NDL SPNL 18GX3.5 QUINCKE PK (NEEDLE) IMPLANT
NEEDLE SPNL 18GX3.5 QUINCKE PK (NEEDLE) ×3 IMPLANT
NS IRRIG 1000ML POUR BTL (IV SOLUTION) ×15 IMPLANT
PACK E OPEN HEART (SUTURE) ×3 IMPLANT
PACK OPEN HEART (CUSTOM PROCEDURE TRAY) ×3 IMPLANT
PACK SPY-PHI (KITS) ×1 IMPLANT
PAD ELECT DEFIB RADIOL ZOLL (MISCELLANEOUS) ×3 IMPLANT
PENCIL BUTTON HOLSTER BLD 10FT (ELECTRODE) ×3 IMPLANT
POSITIONER HEAD DONUT 9IN (MISCELLANEOUS) ×3 IMPLANT
POWDER SURGICEL 3.0 GRAM (HEMOSTASIS) ×1 IMPLANT
SEALANT SURG COSEAL 4ML (VASCULAR PRODUCTS) ×1 IMPLANT
SET CARDIOPLEGIA MPS 5001102 (MISCELLANEOUS) ×1 IMPLANT
SUT BONE WAX W31G (SUTURE) ×3 IMPLANT
SUT MNCRL AB 3-0 PS2 18 (SUTURE) ×6 IMPLANT
SUT PDS AB 1 CTX 36 (SUTURE) ×6 IMPLANT
SUT PROLENE 3 0 SH DA (SUTURE) ×3 IMPLANT
SUT PROLENE 6 0 C 1 30 (SUTURE) ×9 IMPLANT
SUT PROLENE 8 0 BV175 6 (SUTURE) ×4 IMPLANT
SUT PROLENE BLUE 7 0 (SUTURE) ×3 IMPLANT
SUT SILK  1 MH (SUTURE) ×1
SUT SILK 1 MH (SUTURE) IMPLANT
SUT STEEL 6MS V (SUTURE) ×3 IMPLANT
SUT STEEL SZ 6 DBL 3X14 BALL (SUTURE) ×3 IMPLANT
SYR 10ML LL (SYRINGE) IMPLANT
SYSTEM SAHARA CHEST DRAIN ATS (WOUND CARE) ×3 IMPLANT
TAPE CLOTH SOFT 2X10 (GAUZE/BANDAGES/DRESSINGS) ×1 IMPLANT
TAPE CLOTH SURG 4X10 WHT LF (GAUZE/BANDAGES/DRESSINGS) ×1 IMPLANT
TOWEL GREEN STERILE (TOWEL DISPOSABLE) ×3 IMPLANT
TOWEL GREEN STERILE FF (TOWEL DISPOSABLE) ×3 IMPLANT
TRAY FOLEY SLVR 16FR TEMP STAT (SET/KITS/TRAYS/PACK) ×3 IMPLANT
TUBING LAP HI FLOW INSUFFLATIO (TUBING) ×3 IMPLANT
UNDERPAD 30X30 (UNDERPADS AND DIAPERS) ×3 IMPLANT
WATER STERILE IRR 1000ML POUR (IV SOLUTION) ×6 IMPLANT
WATER STERILE IRR 1000ML UROMA (IV SOLUTION) IMPLANT

## 2018-11-04 NOTE — Anesthesia Procedure Notes (Signed)
Central Venous Catheter Insertion Performed by: Lillia Abed, MD, anesthesiologist Start/End10/12/2018 7:05 AM, 11/04/2018 7:15 AM Patient location: Pre-op. Preanesthetic checklist: patient identified, IV checked, risks and benefits discussed, surgical consent, monitors and equipment checked, pre-op evaluation, timeout performed and anesthesia consent Position: Trendelenburg Lidocaine 1% used for infiltration and patient sedated Hand hygiene performed  and maximum sterile barriers used  Catheter size: 8.5 Fr Central line and PA cath was placed.Sheath introducer Swan type:thermodilution Procedure performed using ultrasound guided technique. Ultrasound Notes:anatomy identified, needle tip was noted to be adjacent to the nerve/plexus identified, no ultrasound evidence of intravascular and/or intraneural injection and image(s) printed for medical record Attempts: 1 Following insertion, line sutured and dressing applied. Post procedure assessment: blood return through all ports, free fluid flow and no air  Patient tolerated the procedure well with no immediate complications.

## 2018-11-04 NOTE — Op Note (Signed)
CARDIOTHORACIC SURGERY OPERATIVE NOTE  Date of Procedure: 11/04/2018  Preoperative Diagnosis: Severe 2-vessel Coronary Artery Disease  Postoperative Diagnosis: Same  Procedure:    Coronary Artery Bypass Grafting x 3   Left Internal Mammary Artery to Distal Left Anterior Descending Coronary and Ramus intermedius Artery as sequenced graft; pedicled Right IMA to  Obtuse Marginal Branch of Left Circumflex Coronary Artery; bilateral internal mammary artery harvesting; completion graft surveillance with indocyanine green fluorescence imaging (SPY); application of Prevena incisional management system  Surgeon: B. Murvin Natal, MD  Assistant: Josie Saunders PA-C  Anesthesia: get  Operative Findings:  Mildly depressed left ventricular systolic function  good quality internal mammary artery conduits  good quality target vessels for grafting    BRIEF CLINICAL NOTE AND INDICATIONS FOR SURGERY  48 yo lady with longstanding diabetes and FHx of CAD has undergone several PCI procedures in the past 2 years. She developed atypical chest pain last week, prompting repeat LHC which showed in-stent restenosis and progression of CAD. Referred for CABG.    DETAILS OF THE OPERATIVE PROCEDURE  Preparation:  The patient is brought to the operating room on the above mentioned date and central monitoring was established by the anesthesia team including placement of Swan-Ganz catheter and radial arterial line. The patient is placed in the supine position on the operating table.  Intravenous antibiotics are administered. General endotracheal anesthesia is induced uneventfully. A Foley catheter is placed.  Baseline transesophageal echocardiogram was performed.  Findings were notable for mildly depressed LV function and thickened LV  The patient's chest, abdomen, both groins, and both lower extremities are prepared and draped in a sterile manner. A time out procedure is performed.   Surgical Approach and  Conduit Harvest:  A median sternotomy incision was performed and the left internal mammary artery is dissected from the chest wall and prepared for bypass grafting. The left internal mammary artery is notably good quality conduit. 5,000 units of heparin is given intravenously. Next, attention is turned to the right hemithorax where the right internal mammary artery is mobilized in a similar fashion.  Prior to dividing the pedicle distally the full dose of heparin is given intravenously.  Following systemic heparinization, the left and right internal mammary arteries were transected distally and both noted to have excellent flow.  The arteries were then treated with a solution of papaverine.   Extracorporeal Cardiopulmonary Bypass and Myocardial Protection:  The pericardium is opened. The ascending aorta is nondiseased in appearance. The ascending aorta and the right atrium are cannulated for cardiopulmonary bypass.  Adequate heparinization is verified.   The entire pre-bypass portion of the operation was notable for stable hemodynamics.  Cardiopulmonary bypass was begun and the surface of the heart is inspected. Distal target vessels are selected for coronary artery bypass grafting. A cardioplegia cannula is placed in the ascending aorta.   The patient is allowed to cool passively to 34C systemic temperature.  The aortic cross clamp is applied and cold blood cardioplegia is delivered initially in an antegrade fashion through the aortic root. Iced saline slush is applied for topical hypothermia.  The initial cardioplegic arrest is rapid with early diastolic arrest.  Repeat doses of cardioplegia are administered intermittently throughout the entire cross clamp portion of the operation through the aortic root.  Myocardial protection was felt to be  excellent.   Coronary Artery Bypass Grafting:   The first obtuse marginal branch of the left circumflex coronary artery was grafted using the pedicled  right internal mammary artery which  is brought through the transverse sinus graft in an end-to-side fashion.  At the site of distal anastomosis the target vessel was good quality and measured approximately 1.5 mm in diameter. Anastomotic patency and runoff was confirmed with indocyanine green fluorescence imaging (SPY).   The distal left anterior coronary artery was grafted with the left internal mammary artery in an end-to-side fashion.  At the site of distal anastomosis the target vessel was good quality and measured approximately 1.5 mm in diameter. Anastomotic patency and runoff was confirmed with indocyanine green fluorescence imaging (SPY). The ramus intermedius coronary artery was grafted in and side-to-side fashion using the LIMA as a sequential  graft  At the site of distal anastomosis the target vessel was good quality and measured approximately 2 mm in diameter.Anastomotic patency and runoff was confirmed with indocyanine green fluorescence imaging (SPY).   The aortic cross clamp was removed after a total cross clamp time of 86 minutes.   Procedure Completion:  The distal coronary anastomoses were inspected for hemostasis and appropriate graft orientation. Epicardial pacing wires are fixed to the right ventricular outflow tract and to the right atrial appendage. The patient is rewarmed to 37C temperature. The patient is weaned and disconnected from cardiopulmonary bypass.  The patient's rhythm at separation from bypass was normal sinus.  The patient was weaned from cardiopulmonary bypass  without any inotropic support. Total cardiopulmonary bypass time for the operation was 117 minutes.  Followup transesophageal echocardiogram performed after separation from bypass revealed  no changes from the preoperative exam.  The aortic and venous cannula were removed uneventfully. Protamine was administered to reverse the anticoagulation. The mediastinum and pleural space were inspected for hemostasis  and irrigated with saline solution. The mediastinum and bilateral pleural spaces were drained using fluted chest tubes placed through separate stab incisions inferiorly.  The soft tissues anterior to the aorta were reapproximated loosely. The sternum is closed with double strength sternal wire. The soft tissues anterior to the sternum were closed in multiple layers and the skin is closed with a running subcuticular skin closure.  A Prevena incisional management system was placed on the closed incision as a sterile dressing  The post-bypass portion of the operation was notable for stable rhythm and hemodynamics.  1 unit of blood was administered during the operation.   Disposition:  The patient tolerated the procedure well and is transported to the surgical intensive care in stable condition. There are no intraoperative complications. All sponge instrument and needle counts are verified correct at completion of the operation.    Jayme Cloud, MD 11/04/2018 11:51 PM

## 2018-11-04 NOTE — H&P (Signed)
History and Physical Interval Note:  11/04/2018 8:05 AM  Jennifer Hogan  has presented today for surgery, with the diagnosis of CAD.  The various methods of treatment have been discussed with the patient and family. After consideration of risks, benefits and other options for treatment, the patient has consented to  Procedure(s): CORONARY ARTERY BYPASS GRAFTING (CABG) (N/A) TRANSESOPHAGEAL ECHOCARDIOGRAM (TEE) (N/A) as a surgical intervention.  The patient's history has been reviewed, patient examined, no change in status, stable for surgery.  I have reviewed the patient's chart and labs.  Questions were answered to the patient's satisfaction.     Wonda Olds  TCTS [phone]

## 2018-11-04 NOTE — Procedures (Signed)
Extubation Procedure Note  Patient Details:   Name: Jennifer Hogan DOB: Jan 19, 1971 MRN: [id number]   Airway Documentation:    Vent end date: 11/04/18 Vent end time: 2014   Evaluation  O2 sats: stable throughout Complications: No apparent complications Patient did tolerate procedure well. Bilateral Breath Sounds: Clear, Diminished   Yes, pt able to cough to clear secretions and hoarsely vocalize name and DOB. Pt on 4L humidified nasal cannula tolerating well at this time.   NIF greater than -40X2   VC 4L and 4.5L   Virgilio Frees 11/04/2018, 8:18 PM

## 2018-11-04 NOTE — Discharge Summary (Signed)
Physician Discharge Summary       [address]       [place] [address]             [phone]    [name] MRN: [id number] DOB/AGE: 07/13/1970 48 y.o.  Admit date: 10/29/2018 Discharge date: 11/09/2018  Admission Diagnoses: 1. NSTEMI (non-ST elevated myocardial infarction) ([place]) 2. Coronary artery disease  Discharge Diagnoses:  1. S/p CABG x 3 2. Expected ABL anemia and she has a history of anemia 3. History of Type 1 diabetes mellitus ([place]) 4. History of Hyperlipidemia 5. History of Hypothyroidism 6. History of Ischemic cardiomyopathy 7. History of Depression       Procedure (s):   Coronary Artery Bypass Grafting x 3              Left Internal Mammary Artery to Distal Left Anterior Descending Coronary and Ramus intermedius Artery as sequenced graft; pedicled Right IMA to  Obtuse Marginal Branch of Left Circumflex Coronary Artery; bilateral internal mammary artery harvesting; completion graft surveillance with indocyanine green fluorescence imaging (SPY); application of Prevena incisional management system by Dr. Orvan Seen on 11/04/2018.  History of Presenting Illness: 48 yo lady with long-standing DM and strong FHx of CAD in mother and father presented with atypical angina sx again and demonstrated to have NSTEM. Her cardiac hx dates back approximately 18 months when she underwent 1st PCI which was required reintervention this past Winter. She had been doing reasonably well since then but does report she has never quite gotten back to "normal" in terms of overall energy level or exercise tolerance. Upon LHC, she is demonstrated to have diffuse disease of left coronary system; the RCA system is nondiseased. Her LV function is mildly depressed. Consult received for CABG.  This is a 48 yo lady with severe 2V CAD. She is a great candidate for CABG. Will anticipate surgery on Monday 10/12 due to Plavix up to this point. Also, anticipate  multi-arterial grafting as method for optimizing durability of procedure in this young patient. Potential risks, benefits, and complications of the surgery were discussed with the patient and she agreed to proceed with surgery. Pre operative carotid duplex US showed no significant internal carotid artery stenosis bilaterally. She underwent a CABG x 3 on 10/12.  Brief Hospital Course:  The patient was extubated the evening of surgery without difficulty. She remained afebrile and hemodynamically stable. Gordy Councilman, a line, chest tubes, and foley were removed early in the post operative course. Lopressor was started and titrated accordingly. She was volume over loaded and diuresed. She had ABL anemia. She did not require a post op transfusion. Last H and H was 10.5. She was weaned off the insulin drip.  Once she was tolerating a diet, home diabetic medicines were restarted.  The patient's glucose remained well controlled. The patient's HGA1C pre op was 10.2 . The patient was felt surgically stable for transfer from the ICU to [facility] for further convalescence on 10/15.  She continues to progress with cardiac rehab. She was ambulating on room air. She has been tolerating a diet and has had a bowel movement. Epicardial pacing wires were removed on 10/17 Chest tube sutures will be removed in the office after discharge. The patient is felt surgically stable for discharge today.  Latest Vital Signs: Blood pressure 132/74, pulse 91, temperature 98.2 F (36.8 C), temperature source Oral, resp. rate 18, height 5\' 5"  (1.651 m), weight 68.8 kg, SpO2 97 %.  Physical  Exam:  General appearance: alert, cooperative and no distress Heart: regular rate and rhythm and + murmur Lungs: clear to auscultation bilaterally Abdomen: soft, non-tender; bowel sounds normal; no masses,  no organomegaly Extremities: edema trace Wound: clean and dry  Discharge Condition: Stable and discharged to home.  Recent laboratory studies:    Lab Results  Component Value Date   WBC 7.2 11/09/2018   HGB 10.5 (L) 11/09/2018   HCT 31.4 (L) 11/09/2018   MCV 90.2 11/09/2018   PLT 258 11/09/2018   Lab Results  Component Value Date   NA 137 11/09/2018   K 4.2 11/09/2018   CL 104 11/09/2018   CO2 25 11/09/2018   CREATININE 0.81 11/09/2018   GLUCOSE 174 (H) 11/09/2018      Diagnostic Studies: Dg Chest 2 View  Result Date: 11/08/2018 CLINICAL DATA:  Status post cardiac surgery EXAM: CHEST - 2 VIEW COMPARISON:  11/06/2018 FINDINGS: Interval removal of a left-sided chest tube. No significant pneumothorax. Improved aeration of the lungs and near complete resolution of a trace left pleural effusion. No acute appearing airspace opacity. The heart and mediastinum are normal in size status post median sternotomy. Disc degenerative disease of the thoracic spine IMPRESSION: 1. Interval removal of a left-sided chest tube. No significant pneumothorax. 2. Improved aeration of the lungs and near complete resolution of a trace left pleural effusion. No acute appearing airspace opacity. Electronically Signed   By: Eddie Candle M.D.   On: 11/08/2018 09:30   Dg Chest 2 View  Result Date: 11/03/2018 CLINICAL DATA:  Pre-surgical assessment for CABG EXAM: CHEST - 2 VIEW COMPARISON:  10/29/2018 FINDINGS: The heart size and mediastinal contours are within normal limits. Both lungs are clear. Mild degenerative changes of the midthoracic spine. IMPRESSION: No active cardiopulmonary disease. Electronically Signed   By: Donavan Foil M.D.   On: 11/03/2018 20:55   Dg Chest Port 1 View  Result Date: 11/06/2018 CLINICAL DATA:  Status post cardiac surgery. EXAM: PORTABLE CHEST 1 VIEW COMPARISON:  November 05, 2018. FINDINGS: Stable cardiomegaly. Bilateral chest tubes are noted without pneumothorax. Right internal jugular Swan-Ganz catheter has been removed. No pneumothorax or significant pleural effusion is noted. Right lung is clear. Mild bibasilar  subsegmental atelectasis is noted. Bony thorax is unremarkable. IMPRESSION: Stable bilateral chest tubes without pneumothorax. Mild bibasilar subsegmental atelectasis. Electronically Signed   By: Marijo Conception M.D.   On: 11/06/2018 07:26   Dg Chest Port 1 View  Result Date: 11/05/2018 CLINICAL DATA:  Chest tube, CABG EXAM: PORTABLE CHEST 1 VIEW COMPARISON:  11/04/2018 FINDINGS: Interval removal of endotracheal tube. Swan-Ganz catheter and left chest tube remain in sclerotic that Swan-Ganz catheter and bilateral chest tubes remain in place, unchanged. No pneumothorax. Bibasilar atelectasis and suspected small layering effusions. Mild cardiomegaly. Left upper lobe perihilar atelectasis, stable. IMPRESSION: Interval extubation. Bilateral chest tubes remain in stable position. No pneumothorax. Areas of atelectasis bilaterally.  Suspect small layering effusions. Electronically Signed   By: Rolm Baptise M.D.   On: 11/05/2018 08:23   Dg Chest Port 1 View  Result Date: 11/04/2018 CLINICAL DATA:  Status post CABG EXAM: PORTABLE CHEST 1 VIEW COMPARISON:  November 03, 2018 FINDINGS: The ETT terminates 12 mm above the carina. Recommend withdrawing 1.5 cm. The PA catheter is in good position. Chest tubes and mediastinal drain are stable. There is a tiny left apical pneumothorax. Opacity in the left base is likely a small effusion and atelectasis. Opacity adjacent to the distal left chest tube may  represent atelectasis as well. No other acute abnormalities. IMPRESSION: 1. The ETT terminates 1.2 cm above the carina. Recommend withdrawing 1.5 cm. 2. Other support apparatus as above. There is a small left apical pneumothorax with a left chest tube in place. 3. Opacity in left base is likely atelectasis. There is a small associated effusion. 4. No other acute abnormalities. Electronically Signed   By: Dorise Bullion III M.D   On: 11/04/2018 14:08   Dg Chest Port 1 View  Result Date: 10/29/2018 CLINICAL DATA:  Central  chest pain for several days EXAM: PORTABLE CHEST 1 VIEW COMPARISON:  05/24/2018 FINDINGS: The heart size and mediastinal contours are within normal limits. Both lungs are clear. The visualized skeletal structures are unremarkable. IMPRESSION: No active disease. Electronically Signed   By: Inez Catalina M.D.   On: 10/29/2018 11:56   Vas US Doppler Pre Cabg  Result Date: 10/31/2018 PREOPERATIVE VASCULAR EVALUATION  Indications:      Pre CABG. Risk Factors:     Hyperlipidemia, Diabetes, coronary artery disease. Comparison Study: No prior study Performing Technologist: [place], RVT  Examination Guidelines: A complete evaluation includes B-mode imaging, spectral Doppler, color Doppler, and power Doppler as needed of all accessible portions of each vessel. Bilateral testing is considered an integral part of a complete examination. Limited examinations for reoccurring indications may be performed as noted.  Right Carotid Findings: +----------+--------+--------+--------+--------+--------+             PSV cm/s EDV cm/s Stenosis Describe Comments  +----------+--------+--------+--------+--------+--------+  CCA Prox   98       22                                   +----------+--------+--------+--------+--------+--------+  CCA Distal 83       29                                   +----------+--------+--------+--------+--------+--------+  ICA Prox   72       29                                   +----------+--------+--------+--------+--------+--------+  ICA Distal 76       27                                   +----------+--------+--------+--------+--------+--------+  ECA        97       16                                   +----------+--------+--------+--------+--------+--------+ Portions of this table do not appear on this page. +----------+--------+-------+----------------+------------+             PSV cm/s EDV cms Describe         Arm Pressure  +----------+--------+-------+----------------+------------+   Subclavian 119              Multiphasic, WNL 92            +----------+--------+-------+----------------+------------+ +---------+--------+--+--------+--+---------+  Vertebral PSV cm/s 48 EDV cm/s 19 Antegrade  +---------+--------+--+--------+--+---------+ Left Carotid Findings: +----------+--------+--------+--------+--------+--------+  PSV cm/s EDV cm/s Stenosis Describe Comments  +----------+--------+--------+--------+--------+--------+  CCA Prox   107      22                                   +----------+--------+--------+--------+--------+--------+  CCA Distal 66       20                                   +----------+--------+--------+--------+--------+--------+  ICA Prox   90       34                                   +----------+--------+--------+--------+--------+--------+  ICA Distal 101      47                                   +----------+--------+--------+--------+--------+--------+  ECA        108      14                                   +----------+--------+--------+--------+--------+--------+ +----------+--------+--------+----------------+------------+  Subclavian PSV cm/s EDV cm/s Describe         Arm Pressure  +----------+--------+--------+----------------+------------+             182               Multiphasic, WNL 100           +----------+--------+--------+----------------+------------+ +---------+--------+--+--------+--+---------+  Vertebral PSV cm/s 48 EDV cm/s 17 Antegrade  +---------+--------+--+--------+--+---------+  ABI Findings: +--------+------------------+-----+---------+--------+  Right    Rt Pressure (mmHg) Index Waveform  Comment   +--------+------------------+-----+---------+--------+  Brachial 92                       triphasic           +--------+------------------+-----+---------+--------+  PTA                               triphasic           +--------+------------------+-----+---------+--------+  DP                                triphasic            +--------+------------------+-----+---------+--------+ +--------+------------------+-----+---------+-------+  Left     Lt Pressure (mmHg) Index Waveform  Comment  +--------+------------------+-----+---------+-------+  Brachial 100                      triphasic          +--------+------------------+-----+---------+-------+  PTA                               triphasic          +--------+------------------+-----+---------+-------+  DP                                triphasic          +--------+------------------+-----+---------+-------+  Right Doppler Findings: +-----------+--------+-----+---------+-----------------------------------------+  Site        Pressure Index Doppler   Comments                                   +-----------+--------+-----+---------+-----------------------------------------+  Brachial    92             triphasic                                            +-----------+--------+-----+---------+-----------------------------------------+  Radial                     triphasic                                            +-----------+--------+-----+---------+-----------------------------------------+  Ulnar                      triphasic                                            +-----------+--------+-----+---------+-----------------------------------------+  Palmar Arch                          Signal decreases >50% with radial                                                compression, obliterates with ulnar                                              compression.                               +-----------+--------+-----+---------+-----------------------------------------+  Left Doppler Findings: +-----------+--------+-----+---------+--------------------+  Site        Pressure Index Doppler   Comments              +-----------+--------+-----+---------+--------------------+  Brachial    100            triphasic                       +-----------+--------+-----+---------+--------------------+   Radial                     triphasic                       +-----------+--------+-----+---------+--------------------+  Ulnar                      triphasic                       +-----------+--------+-----+---------+--------------------+  Palmar Arch  Within normal limits  +-----------+--------+-----+---------+--------------------+  Summary: Right Carotid: Velocities in the right ICA are consistent with a 1-39% stenosis. Left Carotid: Velocities in the left ICA are consistent with a 1-39% stenosis. Vertebrals:  Bilateral vertebral arteries demonstrate antegrade flow. Subclavians: Normal flow hemodynamics were seen in bilateral subclavian              arteries.  Electronically signed by Monica Martinez MD on 10/31/2018 at 5:12:12 PM.    Final        Discharge Instructions    Amb Referral to Cardiac Rehabilitation   Complete by: As directed    Diagnosis:  NSTEMI CABG     CABG X ___: 3   After initial evaluation and assessments completed: Virtual Based Care may be provided alone or in conjunction with Phase 2 Cardiac Rehab based on patient barriers.: Yes      Discharge Medications: Allergies as of 11/09/2018      Reactions   Crestor [rosuvastatin Calcium] Other (See Comments)   Severe myalgias and joint pain. Has tolerated atorvastatin though   Peanut-containing Drug Products Other (See Comments)   Vomiting, upset stomach and some wheezing   Ciprofloxin Hcl [ciprofloxacin] Nausea And Vomiting, Other (See Comments)   Severe migraine   Food Color Red [red Dye] Diarrhea      Medication List    STOP taking these medications   isosorbide mononitrate 30 MG 24 hr tablet Commonly known as: IMDUR   rosuvastatin 40 MG tablet Commonly known as: CRESTOR     TAKE these medications   acetaminophen 325 MG tablet Commonly known as: TYLENOL Take 650 mg by mouth every 6 (six) hours as needed for mild pain or moderate pain.   albuterol 108 (90 Base) MCG/ACT  inhaler Commonly known as: VENTOLIN HFA Inhale 2 puffs into the lungs every 6 (six) hours as needed for wheezing or shortness of breath.   aspirin 81 MG EC tablet Take 1 tablet (81 mg total) by mouth daily.   atorvastatin 80 MG tablet Commonly known as: LIPITOR Take 1 tablet (80 mg total) by mouth daily at 6 PM. What changed:   medication strength  when to take this   clopidogrel 75 MG tablet Commonly known as: PLAVIX Take 1 tablet (75 mg total) by mouth daily with breakfast.   colchicine 0.6 MG tablet Take 0.5 tablets (0.3 mg total) by mouth 2 (two) times daily.   FLUoxetine 40 MG capsule Commonly known as: PROZAC Take 1 capsule (40 mg total) by mouth daily.   furosemide 20 MG tablet Commonly known as: LASIX Take 1 tablet (20 mg total) by mouth daily.   insulin aspart 100 UNIT/ML injection Commonly known as: novoLOG 35 units per insulin pump daily What changed:   how much to take  how to take this  when to take this  additional instructions   ipratropium 0.06 % nasal spray Commonly known as: Atrovent Place 2 sprays into both nostrils 4 (four) times daily. What changed: when to take this   isosorbide dinitrate 5 MG tablet Commonly known as: ISORDIL Take 1 tablet (5 mg total) by mouth 3 (three) times daily.   levothyroxine 100 MCG tablet Commonly known as: Euthyrox Take 1 tablet (100 mcg total) by mouth daily before breakfast. Must have office visit for refills   metoprolol tartrate 25 MG tablet Commonly known as: LOPRESSOR Take 0.5 tablets (12.5 mg total) by mouth 2 (two) times daily. Hold for SBP<90 and HR<50 What changed: how much to take  montelukast 10 MG tablet Commonly known as: SINGULAIR TAKE 1 TABLET BY MOUTH AT BEDTIME   nitroGLYCERIN 0.4 MG SL tablet Commonly known as: NITROSTAT Place 1 tablet (0.4 mg total) under the tongue every 5 (five) minutes as needed for chest pain.   pantoprazole 40 MG tablet Commonly known as: PROTONIX Take 1  tablet (40 mg total) by mouth 2 (two) times daily. What changed: when to take this   sucralfate 1 GM/10ML suspension Commonly known as: Carafate Take 10 mLs (1 g total) by mouth 2 (two) times daily for 30 days. What changed:   when to take this  reasons to take this   traMADol 50 MG tablet Commonly known as: ULTRAM Take 1-2 tablets (50-100 mg total) by mouth every 4 (four) hours as needed for moderate pain.      The patient has been discharged on:   1.Beta Blocker:  Yes [   ]                              No   [   ]                              If No, reason:  2.Ace Inhibitor/ARB: Yes [   ]                                     No  [    ]                                     If No, reason:  3.Statin:   Yes [   ]                  No  [   ]                  If No, reason:  4.Ecasa:  Yes  [   ]                  No   [   ]                  If No, reason:  Follow Up Appointments: Follow-up Information    Wonda Olds, MD. Go on 11/15/2018.   Specialty: Cardiothoracic Surgery Why: Appointment time is at 12:00 pm Contact information: [address] [place] [address] [phone]        [name], NP Follow up.   Specialty: Nurse Practitioner Why: Call for a follow up appointment regarding further diabetes management. Pre op HGA1C 10.2 Contact information: [place] [address] [phone]        Charlie Pitter, PA-C. Go on 11/26/2018.   Specialties: Cardiology, Radiology Why: Appointment time is at 9:00 am Contact information: [address] [place] [address] [phone]           Signed: Ellamae Sia 11/09/2018, 9:19 AM

## 2018-11-04 NOTE — Anesthesia Procedure Notes (Signed)
Procedure Name: Intubation Date/Time: 11/04/2018 7:54 AM Performed by: Candis Shine, CRNA Pre-anesthesia Checklist: Patient identified, Emergency Drugs available, Suction available and Patient being monitored Patient Re-evaluated:Patient Re-evaluated prior to induction Oxygen Delivery Method: Circle System Utilized Preoxygenation: Pre-oxygenation with 100% oxygen Induction Type: IV induction Ventilation: Mask ventilation without difficulty Laryngoscope Size: Mac and 3 Grade View: Grade I Tube type: Oral Tube size: 8.0 mm Number of attempts: 1 Airway Equipment and Method: Stylet Placement Confirmation: ETT inserted through vocal cords under direct vision,  positive ETCO2 and breath sounds checked- equal and bilateral Secured at: 21 cm Tube secured with: Tape Dental Injury: Teeth and Oropharynx as per pre-operative assessment

## 2018-11-04 NOTE — Anesthesia Procedure Notes (Signed)
Arterial Line Insertion Start/End10/12/2018 7:15 AM Performed by: Candis Shine, CRNA, CRNA  Patient location: Pre-op. Preanesthetic checklist: patient identified, IV checked, site marked, risks and benefits discussed, surgical consent, monitors and equipment checked, pre-op evaluation, timeout performed and anesthesia consent Lidocaine 1% used for infiltration Right, radial was placed Catheter size: 20 Fr Hand hygiene performed  and maximum sterile barriers used   Attempts: 4 Procedure performed without using ultrasound guided technique. Following insertion, dressing applied and Biopatch. Post procedure assessment: normal and unchanged  Patient tolerated the procedure well with no immediate complications. Additional procedure comments: Multiple attempts unsuccessful on left by 2 CRNAs. Placed on right without difficulty.Marland Kitchen

## 2018-11-04 NOTE — Progress Notes (Signed)
  Amiodarone Drug - Drug Interaction Consult Note  Recommendations: -Monitor HR  Amiodarone is metabolized by the cytochrome P450 system and therefore has the potential to cause many drug interactions. Amiodarone has an average plasma half-life of 50 days (range 20 to 100 days).   There is potential for drug interactions to occur several weeks or months after stopping treatment and the onset of drug interactions may be slow after initiating amiodarone.   []  Statins: Increased risk of myopathy. Simvastatin- restrict dose to 20mg  daily. Other statins: counsel patients to report any muscle pain or weakness immediately.  []  Anticoagulants: Amiodarone can increase anticoagulant effect. Consider warfarin dose reduction. Patients should be monitored closely and the dose of anticoagulant altered accordingly, remembering that amiodarone levels take several weeks to stabilize.  []  Antiepileptics: Amiodarone can increase plasma concentration of phenytoin, the dose should be reduced. Note that small changes in phenytoin dose can result in large changes in levels. Monitor patient and counsel on signs of toxicity.  [x]  Beta blockers: increased risk of bradycardia, AV block and myocardial depression. Sotalol - avoid concomitant use.  []   Calcium channel blockers (diltiazem and verapamil): increased risk of bradycardia, AV block and myocardial depression.  []   Cyclosporine: Amiodarone increases levels of cyclosporine. Reduced dose of cyclosporine is recommended.  []  Digoxin dose should be halved when amiodarone is started.  []  Diuretics: increased risk of cardiotoxicity if hypokalemia occurs.  []  Oral hypoglycemic agents (glyburide, glipizide, glimepiride): increased risk of hypoglycemia. Patient's glucose levels should be monitored closely when initiating amiodarone therapy.   []  Drugs that prolong the QT interval:  Torsades de pointes risk may be increased with concurrent use - avoid if possible.   Monitor QTc, also keep magnesium/potassium WNL if concurrent therapy can't be avoided. Marland Kitchen Antibiotics: e.g. fluoroquinolones, erythromycin. . Antiarrhythmics: e.g. quinidine, procainamide, disopyramide, sotalol. . Antipsychotics: e.g. phenothiazines, haloperidol.  . Lithium, tricyclic antidepressants, and methadone. Thank Concha Pyo  11/04/2018 1:56 PM

## 2018-11-04 NOTE — Plan of Care (Signed)
  Problem: Education: Goal: Knowledge of General Education information will improve Description: Including pain rating scale, medication(s)/side effects and non-pharmacologic comfort measures Outcome: Progressing   Problem: Health Behavior/Discharge Planning: Goal: Ability to manage health-related needs will improve Outcome: Progressing   Problem: Clinical Measurements: Goal: Ability to maintain clinical measurements within normal limits will improve Outcome: Progressing Goal: Will remain free from infection Outcome: Progressing Goal: Diagnostic test results will improve Outcome: Progressing Goal: Respiratory complications will improve Outcome: Progressing Goal: Cardiovascular complication will be avoided Outcome: Progressing   Problem: Activity: Goal: Risk for activity intolerance will decrease Outcome: Progressing   Problem: Nutrition: Goal: Adequate nutrition will be maintained Outcome: Progressing   Problem: Coping: Goal: Level of anxiety will decrease Outcome: Progressing   Problem: Elimination: Goal: Will not experience complications related to bowel motility Outcome: Progressing Goal: Will not experience complications related to urinary retention Outcome: Progressing   Problem: Pain Managment: Goal: General experience of comfort will improve Outcome: Progressing   Problem: Safety: Goal: Ability to remain free from injury will improve Outcome: Progressing   Problem: Skin Integrity: Goal: Risk for impaired skin integrity will decrease Outcome: Progressing   Problem: Education: Goal: Will demonstrate proper wound care and an understanding of methods to prevent future damage Outcome: Progressing Goal: Knowledge of disease or condition will improve Outcome: Progressing Goal: Knowledge of the prescribed therapeutic regimen will improve Outcome: Progressing Goal: Individualized Educational Video(s) Outcome: Progressing   Problem: Activity: Goal: Risk for  activity intolerance will decrease Outcome: Progressing   Problem: Cardiac: Goal: Will achieve and/or maintain hemodynamic stability Outcome: Progressing   Problem: Clinical Measurements: Goal: Postoperative complications will be avoided or minimized Outcome: Progressing   Problem: Respiratory: Goal: Respiratory status will improve Outcome: Progressing   Problem: Skin Integrity: Goal: Wound healing without signs and symptoms of infection Outcome: Progressing Goal: Risk for impaired skin integrity will decrease Outcome: Progressing   Problem: Urinary Elimination: Goal: Ability to achieve and maintain adequate renal perfusion and functioning will improve Outcome: Progressing   Problem: Education: Goal: Will demonstrate proper wound care and an understanding of methods to prevent future damage Outcome: Progressing Goal: Knowledge of disease or condition will improve Outcome: Progressing Goal: Knowledge of the prescribed therapeutic regimen will improve Outcome: Progressing Goal: Individualized Educational Video(s) Outcome: Progressing   Problem: Activity: Goal: Risk for activity intolerance will decrease Outcome: Progressing   Problem: Cardiac: Goal: Will achieve and/or maintain hemodynamic stability Outcome: Progressing   Problem: Clinical Measurements: Goal: Postoperative complications will be avoided or minimized Outcome: Progressing   Problem: Respiratory: Goal: Respiratory status will improve Outcome: Progressing   Problem: Skin Integrity: Goal: Wound healing without signs and symptoms of infection Outcome: Progressing Goal: Risk for impaired skin integrity will decrease Outcome: Progressing   Problem: Urinary Elimination: Goal: Ability to achieve and maintain adequate renal perfusion and functioning will improve Outcome: Progressing   

## 2018-11-04 NOTE — Transfer of Care (Signed)
Immediate Anesthesia Transfer of Care Note  Patient: Jennifer Hogan  Procedure(s) Performed: CORONARY ARTERY BYPASS GRAFTING (CABG), ON PUMP, TIMES THREE, USING LEFT AND RIGHT INTERNAL MAMMARY ARTERIES (N/A Chest) TRANSESOPHAGEAL ECHOCARDIOGRAM (TEE) (N/A )  Patient Location: ICU  Anesthesia Type:General  Level of Consciousness: sedated and Patient remains intubated per anesthesia plan  Airway & Oxygen Therapy: Patient remains intubated per anesthesia plan and Patient placed on Ventilator (see vital sign flow sheet for setting)  Post-op Assessment: Report given to RN and Post -op Vital signs reviewed and stable  Post vital signs: Reviewed and stable  Last Vitals:  Vitals Value Taken Time  BP 110/57 11/04/18 1343  Temp    Pulse 89 11/04/18 1354  Resp 20 11/04/18 1354  SpO2 100 % 11/04/18 1354  Vitals shown include unvalidated device data.  Last Pain:  Vitals:   11/04/18 0443  TempSrc: Oral  PainSc:       Patients Stated Pain Goal: 0 ([id number])  Complications: No apparent anesthesia complications

## 2018-11-04 NOTE — Progress Notes (Signed)
Echocardiogram Echocardiogram Transesophageal has been performed.  Jennifer Hogan 11/04/2018, 8:16 AM

## 2018-11-04 NOTE — Brief Op Note (Addendum)
10/29/2018 - 11/04/2018  12:10 PM  PATIENT:  Jennifer Hogan  48 y.o. female  PRE-OPERATIVE DIAGNOSIS:  1. S/p NSTEMI 2. CAD  POST-OPERATIVE DIAGNOSIS:  1. S/p NSTEMI 2. CAD  PROCEDURE:  TRANSESOPHAGEAL ECHOCARDIOGRAM (TEE), CORONARY ARTERY BYPASS GRAFTING (CABG)TIMES THREE (LIMA to SEQUENTIALLY to LAD and RAMUS INTERMEDIATE and RIMA to OM) USING LEFT AND RIGHT INTERNAL MAMMARY ARTERIES   SURGEON:  Surgeon(s) and Role:    Wonda Olds, MD - Primary  PHYSICIAN ASSISTANT: Lars Pinks PA-C  ASSISTANTS: Ara Kussmaul RNFA  ANESTHESIA:   general  EBL:  Per perfusion and anesthesia record  DRAINS: Chest tubes placed in the mediastinal and pleural spaces   COUNTS CORRECt:  YES  DICTATION: .Dragon Dictation  PLAN OF CARE: Admit to inpatient   PATIENT DISPOSITION:  ICU - intubated and hemodynamically stable.   Delay start of Pharmacological VTE agent (>24hrs) due to surgical blood loss or risk of bleeding: yes  BASELINE WEIGHT: 65.5 kg Jennifer Hogan, [place]

## 2018-11-04 NOTE — Anesthesia Preprocedure Evaluation (Signed)
Anesthesia Evaluation  Patient identified by MRN, date of birth, ID band Patient awake    Reviewed: Allergy & Precautions, NPO status , Patient's Chart, lab work & pertinent test results  Airway Mallampati: I  TM Distance: >3 FB Neck ROM: Full    Dental   Pulmonary    Pulmonary exam normal        Cardiovascular + CAD, + Past MI and + Cardiac Stents  Normal cardiovascular exam     Neuro/Psych Anxiety Depression    GI/Hepatic   Endo/Other  diabetes, Type 1, Insulin Dependent  Renal/GU      Musculoskeletal   Abdominal   Peds  Hematology   Anesthesia Other Findings   Reproductive/Obstetrics                             Anesthesia Physical Anesthesia Plan  ASA: III  Anesthesia Plan: General   Post-op Pain Management:    Induction: Intravenous  PONV Risk Score and Plan: 3 and Ondansetron and Treatment may vary due to age or medical condition  Airway Management Planned: Oral ETT  Additional Equipment: Arterial line, PA Cath, TEE and Ultrasound Guidance Line Placement  Intra-op Plan:   Post-operative Plan: Post-operative intubation/ventilation  Informed Consent: I have reviewed the patients History and Physical, chart, labs and discussed the procedure including the risks, benefits and alternatives for the proposed anesthesia with the patient or authorized representative who has indicated his/her understanding and acceptance.       Plan Discussed with: CRNA and Surgeon  Anesthesia Plan Comments:         Anesthesia Quick Evaluation

## 2018-11-04 NOTE — Discharge Instructions (Signed)

## 2018-11-05 ENCOUNTER — Encounter (HOSPITAL_COMMUNITY): Payer: Self-pay | Admitting: Cardiothoracic Surgery

## 2018-11-05 ENCOUNTER — Inpatient Hospital Stay (HOSPITAL_COMMUNITY): Payer: Self-pay

## 2018-11-05 LAB — GLUCOSE, CAPILLARY
Glucose-Capillary: 105 mg/dL — ABNORMAL HIGH (ref 70–99)
Glucose-Capillary: 108 mg/dL — ABNORMAL HIGH (ref 70–99)
Glucose-Capillary: 113 mg/dL — ABNORMAL HIGH (ref 70–99)
Glucose-Capillary: 117 mg/dL — ABNORMAL HIGH (ref 70–99)
Glucose-Capillary: 117 mg/dL — ABNORMAL HIGH (ref 70–99)
Glucose-Capillary: 119 mg/dL — ABNORMAL HIGH (ref 70–99)
Glucose-Capillary: 121 mg/dL — ABNORMAL HIGH (ref 70–99)
Glucose-Capillary: 137 mg/dL — ABNORMAL HIGH (ref 70–99)
Glucose-Capillary: 138 mg/dL — ABNORMAL HIGH (ref 70–99)
Glucose-Capillary: 141 mg/dL — ABNORMAL HIGH (ref 70–99)
Glucose-Capillary: 148 mg/dL — ABNORMAL HIGH (ref 70–99)
Glucose-Capillary: 151 mg/dL — ABNORMAL HIGH (ref 70–99)
Glucose-Capillary: 174 mg/dL — ABNORMAL HIGH (ref 70–99)
Glucose-Capillary: 179 mg/dL — ABNORMAL HIGH (ref 70–99)
Glucose-Capillary: 206 mg/dL — ABNORMAL HIGH (ref 70–99)
Glucose-Capillary: 224 mg/dL — ABNORMAL HIGH (ref 70–99)
Glucose-Capillary: 237 mg/dL — ABNORMAL HIGH (ref 70–99)
Glucose-Capillary: 258 mg/dL — ABNORMAL HIGH (ref 70–99)
Glucose-Capillary: 59 mg/dL — ABNORMAL LOW (ref 70–99)
Glucose-Capillary: 64 mg/dL — ABNORMAL LOW (ref 70–99)
Glucose-Capillary: 76 mg/dL (ref 70–99)
Glucose-Capillary: 87 mg/dL (ref 70–99)
Glucose-Capillary: 92 mg/dL (ref 70–99)
Glucose-Capillary: 99 mg/dL (ref 70–99)

## 2018-11-05 LAB — TYPE AND SCREEN
ABO/RH(D): A POS
Antibody Screen: NEGATIVE
Unit division: 0
Unit division: 0

## 2018-11-05 LAB — BPAM RBC
Blood Product Expiration Date: 202011012359
Blood Product Expiration Date: 202011022359
ISSUE DATE / TIME: 202010120847
ISSUE DATE / TIME: 202010121523
Unit Type and Rh: 6200
Unit Type and Rh: 6200

## 2018-11-05 LAB — BASIC METABOLIC PANEL
Anion gap: 9 (ref 5–15)
Anion gap: 7 (ref 5–15)
BUN: 10 mg/dL (ref 6–20)
BUN: 14 mg/dL (ref 6–20)
CO2: 21 mmol/L — ABNORMAL LOW (ref 22–32)
CO2: 23 mmol/L (ref 22–32)
Calcium: 7.7 mg/dL — ABNORMAL LOW (ref 8.9–10.3)
Calcium: 7.6 mg/dL — ABNORMAL LOW (ref 8.9–10.3)
Chloride: 107 mmol/L (ref 98–111)
Chloride: 106 mmol/L (ref 98–111)
Creatinine, Ser: 0.86 mg/dL (ref 0.44–1.00)
Creatinine, Ser: 0.99 mg/dL (ref 0.44–1.00)
GFR calc Af Amer: >60 mL/min (ref >60)
GFR calc Af Amer: >60 mL/min (ref >60)
GFR calc non Af Amer: >60 mL/min (ref >60)
GFR calc non Af Amer: >60 mL/min (ref >60)
Glucose, Bld: 140 mg/dL — ABNORMAL HIGH (ref 70–99)
Glucose, Bld: 67 mg/dL — ABNORMAL LOW (ref 70–99)
Potassium: 5.3 mmol/L — ABNORMAL HIGH (ref 3.5–5.1)
Potassium: 4.2 mmol/L (ref 3.5–5.1)
Sodium: 137 mmol/L (ref 135–145)
Sodium: 136 mmol/L (ref 135–145)

## 2018-11-05 LAB — CBC
HCT: 29.4 % — ABNORMAL LOW (ref 36.0–46.0)
HCT: 29.1 % — ABNORMAL LOW (ref 36.0–46.0)
Hemoglobin: 9.6 g/dL — ABNORMAL LOW (ref 12.0–15.0)
Hemoglobin: 9.4 g/dL — ABNORMAL LOW (ref 12.0–15.0)
MCH: 30 pg (ref 26.0–34.0)
MCH: 30.1 pg (ref 26.0–34.0)
MCHC: 32.7 g/dL (ref 30.0–36.0)
MCHC: 32.3 g/dL (ref 30.0–36.0)
MCV: 91.9 fL (ref 80.0–100.0)
MCV: 93.3 fL (ref 80.0–100.0)
Platelets: 154 10*3/uL (ref 150–400)
Platelets: 153 10*3/uL (ref 150–400)
RBC: 3.2 MIL/uL — ABNORMAL LOW (ref 3.87–5.11)
RBC: 3.12 MIL/uL — ABNORMAL LOW (ref 3.87–5.11)
RDW: 15.4 % (ref 11.5–15.5)
RDW: 15.7 % — ABNORMAL HIGH (ref 11.5–15.5)
WBC: 8 10*3/uL (ref 4.0–10.5)
WBC: 7.8 10*3/uL (ref 4.0–10.5)
nRBC: 0 % (ref 0.0–0.2)
nRBC: 0 % (ref 0.0–0.2)

## 2018-11-05 LAB — MAGNESIUM
Magnesium: 2.8 mg/dL — ABNORMAL HIGH (ref 1.7–2.4)
Magnesium: 2.7 mg/dL — ABNORMAL HIGH (ref 1.7–2.4)

## 2018-11-05 MED ORDER — DEXTROSE 50 % IV SOLN
16.0000 mL | Freq: Once | INTRAVENOUS | Status: AC
  Administered 2018-11-05: 17:00:00 16 mL via INTRAVENOUS

## 2018-11-05 MED ORDER — KETOROLAC TROMETHAMINE 15 MG/ML IJ SOLN
7.5000 mg | Freq: Four times a day (QID) | INTRAMUSCULAR | Status: DC
  Administered 2018-11-05 – 2018-11-07 (×10): 7.5 mg via INTRAVENOUS
  Filled 2018-11-05 (×10): qty 1

## 2018-11-05 MED ORDER — DEXTROSE 5 % IV SOLN
INTRAVENOUS | Status: DC
  Administered 2018-11-05 – 2018-11-06 (×4): via INTRAVENOUS

## 2018-11-05 MED ORDER — DEXTROSE 50 % IV SOLN
INTRAVENOUS | Status: AC
  Filled 2018-11-05: qty 50

## 2018-11-05 MED ORDER — CLOPIDOGREL BISULFATE 75 MG PO TABS
75.0000 mg | ORAL_TABLET | Freq: Every day | ORAL | Status: DC
  Administered 2018-11-05 – 2018-11-09 (×5): 75 mg via ORAL
  Filled 2018-11-05 (×5): qty 1

## 2018-11-05 MED ORDER — DIAZEPAM 2 MG PO TABS: 2.0000 mg | ORAL_TABLET | Freq: Four times a day (QID) | ORAL | Status: DC | PRN

## 2018-11-05 MED ORDER — ISOSORBIDE MONONITRATE ER 30 MG PO TB24
30.0000 mg | ORAL_TABLET | Freq: Every day | ORAL | Status: DC
  Administered 2018-11-05: 30 mg via ORAL
  Filled 2018-11-05: qty 1

## 2018-11-05 MED ORDER — CHLORHEXIDINE GLUCONATE 0.12 % MT SOLN
OROMUCOSAL | Status: AC
  Administered 2018-11-05: 15 mL via OROMUCOSAL
  Filled 2018-11-05: qty 15

## 2018-11-05 MED FILL — Sodium Bicarbonate IV Soln 8.4%: INTRAVENOUS | Qty: 50 | Status: AC

## 2018-11-05 MED FILL — Potassium Chloride Inj 2 mEq/ML: INTRAVENOUS | Qty: 40 | Status: AC

## 2018-11-05 MED FILL — Lidocaine HCl Local Soln Prefilled Syringe 100 MG/5ML (2%): INTRAMUSCULAR | Qty: 10 | Status: AC

## 2018-11-05 MED FILL — Heparin Sodium (Porcine) Inj 1000 Unit/ML: INTRAMUSCULAR | Qty: 30 | Status: AC

## 2018-11-05 MED FILL — Heparin Sodium (Porcine) Inj 1000 Unit/ML: INTRAMUSCULAR | Qty: 20 | Status: AC

## 2018-11-05 MED FILL — Mannitol IV Soln 20%: INTRAVENOUS | Qty: 500 | Status: AC

## 2018-11-05 MED FILL — Sodium Chloride IV Soln 0.9%: INTRAVENOUS | Qty: 2000 | Status: AC

## 2018-11-05 MED FILL — Electrolyte-R (PH 7.4) Solution: INTRAVENOUS | Qty: 4000 | Status: AC

## 2018-11-05 NOTE — Progress Notes (Signed)
EVENING ROUNDS NOTE :     [address]       [place] [address]             [phone]                 1 Day Post-Op Procedure(s) (LRB): CORONARY ARTERY BYPASS GRAFTING (CABG), ON PUMP, TIMES THREE, USING LEFT AND RIGHT INTERNAL MAMMARY ARTERIES (N/A) TRANSESOPHAGEAL ECHOCARDIOGRAM (TEE) (N/A)   Total Length of Stay:  LOS: 7 days  Events:  Doing well up to chair    BP (!) 84/61   Pulse 72   Temp 98.4 F (36.9 C) (Oral)   Resp 17   Ht 5\' 5"  (1.651 m)   Wt 73.8 kg   SpO2 96%   BMI 27.06 kg/m   PAP: (20-34)/(9-16) 25/12 CO:  [4.6 L/min-5.6 L/min] 5.6 L/min CI:  [2.7 L/min/m2-3.3 L/min/m2] 3.3 L/min/m2  Vent Mode: PSV;CPAP FiO2 (%):  [40 %] 40 % Set Rate:  [4 bmp] 4 bmp Vt Set:  [450 mL] 450 mL PEEP:  [5 cmH20] 5 cmH20 Pressure Support:  [5 cmH20-10 cmH20] 5 cmH20  . sodium chloride Stopped (11/04/18 2323)  . sodium chloride    . sodium chloride 20 mL/hr at 11/04/18 1343  . amiodarone 30 mg/hr (11/05/18 1400)  . cefUROXime (ZINACEF)  IV 1.5 g (11/05/18 1549)  . dexmedetomidine (PRECEDEX) IV infusion Stopped (11/04/18 1729)  . epinephrine Stopped (11/05/18 0012)  . insulin 3.5 mL/hr at 11/05/18 1400  . lactated ringers    . lactated ringers    . lactated ringers 20 mL/hr at 11/05/18 1400  . nitroGLYCERIN 5 mcg/min (11/05/18 1400)  . phenylephrine (NEO-SYNEPHRINE) Adult infusion Stopped (11/05/18 1124)    I/O last 3 completed shifts: In: 6428.1 [I.V.:4786; Blood:465; IV Piggyback:1177.1] Out: [id number] [Urine:2190; Emesis/NG output:450; Chest Tube:780]   CBC Latest Ref Rng & Units 11/05/2018 11/05/2018 11/04/2018  WBC 4.0 - 10.5 K/uL 7.8 8.0 -  Hemoglobin 12.0 - 15.0 g/dL 9.4(L) 9.6(L) 9.2(L)  Hematocrit 36.0 - 46.0 % 29.1(L) 29.4(L) 27.0(L)  Platelets 150 - 400 K/uL 153 154 -    BMP Latest Ref Rng & Units 11/05/2018 11/04/2018 11/04/2018  Glucose 70 - 99 mg/dL 140(H) - -  BUN 6 - 20 mg/dL 10 - -  Creatinine 0.44 - 1.00 mg/dL 0.86 - -   BUN/Creat Ratio 9 - 23 - - -  Sodium 135 - 145 mmol/L 137 142 142  Potassium 3.5 - 5.1 mmol/L 5.3(H) 3.6 3.9  Chloride 98 - 111 mmol/L 107 - -  CO2 22 - 32 mmol/L 21(L) - -  Calcium 8.9 - 10.3 mg/dL 7.7(L) - -    ABG    Component Value Date/Time   PHART 7.327 (L) 11/04/2018 2133   PCO2ART 42.2 11/04/2018 2133   PO2ART 146.0 (H) 11/04/2018 2133   HCO3 22.0 11/04/2018 2133   TCO2 23 11/04/2018 2133   ACIDBASEDEF 4.0 (H) 11/04/2018 2133   O2SAT 99.0 11/04/2018 2133       Melodie Bouillon, MD 11/05/2018 4:37 PM

## 2018-11-05 NOTE — Anesthesia Postprocedure Evaluation (Signed)
Anesthesia Post Note  Patient: Jennifer Hogan  Procedure(s) Performed: CORONARY ARTERY BYPASS GRAFTING (CABG), ON PUMP, TIMES THREE, USING LEFT AND RIGHT INTERNAL MAMMARY ARTERIES (N/A Chest) TRANSESOPHAGEAL ECHOCARDIOGRAM (TEE) (N/A )     Patient location during evaluation: PACU Anesthesia Type: General Level of consciousness: awake and alert Pain management: pain level controlled Vital Signs Assessment: post-procedure vital signs reviewed and stable Respiratory status: spontaneous breathing, nonlabored ventilation, respiratory function stable and patient connected to nasal cannula oxygen Cardiovascular status: blood pressure returned to baseline and stable Postop Assessment: no apparent nausea or vomiting Anesthetic complications: no    Last Vitals:  Vitals:   11/05/18 0000 11/05/18 0100  BP: 98/66 92/66  Pulse: 85 84  Resp: (!) 21 20  Temp: 37.5 C 37.3 C  SpO2: 100% 100%    Last Pain:  Vitals:   11/05/18 0020  TempSrc:   PainSc: Unity DAVID

## 2018-11-05 NOTE — Progress Notes (Signed)
Inpatient Diabetes Program Recommendations  AACE/ADA: New Consensus Statement on Inpatient Glycemic Control (2015)  Target Ranges:  Prepandial:   less than 140 mg/dL      Peak postprandial:   less than 180 mg/dL (1-2 hours)      Critically ill patients:  140 - 180 mg/dL   Lab Results  Component Value Date   GLUCAP 113 (H) 11/05/2018   HGBA1C 10.2 (H) 11/02/2018    Review of Glycemic Control Results for MEHAK, FINKLEA (MRN [id number]) as of 11/05/2018 11:04  Ref. Range 11/05/2018 05:16 11/05/2018 06:24 11/05/2018 07:50 11/05/2018 08:41 11/05/2018 09:57  Glucose-Capillary Latest Ref Range: 70 - 99 mg/dL 99 92 141 (H) 148 (H) 113 (H)   Diabetes history: Type 1 DM  Outpatient Diabetes medications:  Basal Rate- 0.675 units/hr 24 hours per day Total basal per 24 hour period- 16.2 units Carb Ratio- 1 unit for every 17 grams Carbohydrates Correction Ratio- 1 unit for every 60 mg/dl above Target CBG Target CBG 100 mg/dl Current orders for Inpatient glycemic control:  IV insulin/GlucoStabilizer   Inpatient Diabetes Program Recommendations:    Continue IV insulin until patient feeling well enough to restart insulin pump possibly on 10/14.  Note that IV insulin will need to overlap insulin pump by 1-2 hours.   Thanks  Adah Perl, RN, BC-ADM Inpatient Diabetes Coordinator Pager [phone] (8a-5p)

## 2018-11-05 NOTE — Progress Notes (Addendum)
Progress Note  Patient Name: Jennifer Hogan Date of Encounter: 11/05/2018  Primary Cardiologist: Fransico Him, MD   Subjective   Complains of nausea and had an episode of nonbloody nonbilious emesis this a.m.  On reexamination, she was feeling much better.  Also complains of chest soreness but denies shortness of breath  Inpatient Medications    Scheduled Meds: . acetaminophen  1,000 mg Oral Q6H   Or  . acetaminophen (TYLENOL) oral liquid 160 mg/5 mL  1,000 mg Per Tube Q6H  . aspirin EC  325 mg Oral Daily   Or  . aspirin  324 mg Per Tube Daily  . atorvastatin  80 mg Oral q1800  . bisacodyl  10 mg Oral Daily   Or  . bisacodyl  10 mg Rectal Daily  . chlorhexidine gluconate (MEDLINE KIT)  15 mL Mouth Rinse BID  . Chlorhexidine Gluconate Cloth  6 each Topical Daily  . clopidogrel  75 mg Oral Daily  . docusate sodium  200 mg Oral Daily  . FLUoxetine  40 mg Oral Daily  . insulin regular  0-10 Units Intravenous TID WC  . ketorolac  7.5 mg Intravenous Q6H  . levothyroxine  100 mcg Oral Q0600  . mouth rinse  15 mL Mouth Rinse BID  . metoprolol tartrate  12.5 mg Oral BID   Or  . metoprolol tartrate  12.5 mg Per Tube BID  . montelukast  10 mg Oral QHS  . [START ON 11/06/2018] pantoprazole  40 mg Oral Daily  . sodium chloride flush  10-40 mL Intracatheter Q12H  . sodium chloride flush  3 mL Intravenous Q12H   Continuous Infusions: . sodium chloride Stopped (11/04/18 2323)  . sodium chloride    . sodium chloride 20 mL/hr at 11/04/18 1343  . albumin human 12.5 g (11/04/18 1440)  . amiodarone 30 mg/hr (11/05/18 0600)  . cefUROXime (ZINACEF)  IV Stopped (11/05/18 0551)  . dexmedetomidine (PRECEDEX) IV infusion Stopped (11/04/18 1729)  . epinephrine Stopped (11/05/18 0012)  . insulin 0.8 mL/hr at 11/05/18 0600  . lactated ringers    . lactated ringers    . lactated ringers 20 mL/hr at 11/05/18 0600  . nitroGLYCERIN 5 mcg/min (11/05/18 0600)  . phenylephrine  (NEO-SYNEPHRINE) Adult infusion 15 mcg/min (11/05/18 0600)   PRN Meds: sodium chloride, albumin human, ALPRAZolam, diazepam, influenza vac split quadrivalent PF, lactated ringers, metoprolol tartrate, midazolam, morphine injection, ondansetron (ZOFRAN) IV, oxyCODONE, pneumococcal 23 valent vaccine, sodium chloride flush, sodium chloride flush, traMADol   Vital Signs    Vitals:   11/04/18 2100 11/04/18 2200 11/05/18 0000 11/05/18 0100  BP: 110/69 1'11/61 98/66 92/66 '  Pulse: 89 89 85 84  Resp: 16 16 (!) 21 20  Temp: 99.5 F (37.5 C) 99.1 F (37.3 C) 99.5 F (37.5 C) 99.1 F (37.3 C)  TempSrc:      SpO2: 100% 100% 100% 100%  Weight:      Height:        Intake/Output Summary (Last 24 hours) at 11/05/2018 0715 Last data filed at 11/05/2018 0600 Gross per 24 hour  Intake 6428.07 ml  Output 3420 ml  Net 3008.07 ml   Filed Weights   11/01/18 0400 11/02/18 0315 11/03/18 0357  Weight: 68 kg 66.6 kg 65.5 kg    Telemetry    No evidence of V. tach or V. fib- Personally Reviewed  ECG    Normal sinus- Personally Reviewed  Physical Exam   GEN: No acute distress.   Cardiac: RRR,  no murmurs, rubs, or gallops.  Respiratory: Clear to auscultation bilaterally. MS: No edema; No deformity. Neuro:  Nonfocal  Psych: Normal affect   Labs    Chemistry Recent Labs  Lab 11/02/18 0753  11/03/18 1400 11/04/18 0520  11/04/18 1231  11/04/18 1923 11/04/18 1925 11/04/18 2133 11/05/18 0417  NA 138   < > 136 138   < > 140   < > 140 142 142 137  K 3.9   < > 4.2 4.2   < > 4.1   < > 3.9 3.9 3.6 5.3*  CL 103   < > 103 101   < > 102  --  108  --   --  107  CO2 28   < > 23 25  --   --   --  22  --   --  21*  GLUCOSE 168*   < > 197* 238*   < > 97  --  213*  --   --  140*  BUN 7   < > 9 8   < > 8  --  9  --   --  10  CREATININE 0.84   < > 0.97 1.05*   < > 0.60  --  0.97  --   --  0.86  CALCIUM 9.0   < > 9.1 9.6  --   --   --  7.5*  --   --  7.7*  PROT 5.8*  --  6.3*  --   --   --   --   --    --   --   --   ALBUMIN 3.0*  --  3.3*  --   --   --   --   --   --   --   --   AST 24  --  28  --   --   --   --   --   --   --   --   ALT 22  --  27  --   --   --   --   --   --   --   --   ALKPHOS 51  --  52  --   --   --   --   --   --   --   --   BILITOT 0.2*  --  0.4  --   --   --   --   --   --   --   --   GFRNONAA >60   < > >60 >60  --   --   --  >60  --   --  >60  GFRAA >60   < > >60 >60  --   --   --  >60  --   --  >60  ANIONGAP 7   < > 10 12  --   --   --  10  --   --  9   < > = values in this interval not displayed.     Hematology Recent Labs  Lab 11/04/18 1341  11/04/18 1923 11/04/18 1925 11/04/18 2133 11/05/18 0417  WBC 4.3  --  8.0  --   --  8.0  RBC 2.67*  --  3.13*  --   --  3.20*  HGB 8.4*   < > 9.3* 9.2* 9.2* 9.6*  HCT 24.3*   < > 28.5* 27.0* 27.0* 29.4*  MCV 91.0  --  91.1  --   --  91.9  MCH 31.5  --  29.7  --   --  30.0  MCHC 34.6  --  32.6  --   --  32.7  RDW 13.8  --  14.5  --   --  15.4  PLT 142*  --  181  --   --  154   < > = values in this interval not displayed.    Cardiac EnzymesNo results for input(s): TROPONINI in the last 168 hours. No results for input(s): TROPIPOC in the last 168 hours.   BNPNo results for input(s): BNP, PROBNP in the last 168 hours.   DDimer No results for input(s): DDIMER in the last 168 hours.   Radiology    Dg Chest 2 View  Result Date: 11/03/2018 CLINICAL DATA:  Pre-surgical assessment for CABG EXAM: CHEST - 2 VIEW COMPARISON:  10/29/2018 FINDINGS: The heart size and mediastinal contours are within normal limits. Both lungs are clear. Mild degenerative changes of the midthoracic spine. IMPRESSION: No active cardiopulmonary disease. Electronically Signed   By: Donavan Foil M.D.   On: 11/03/2018 20:55   Dg Chest Port 1 View  Result Date: 11/04/2018 CLINICAL DATA:  Status post CABG EXAM: PORTABLE CHEST 1 VIEW COMPARISON:  November 03, 2018 FINDINGS: The ETT terminates 12 mm above the carina. Recommend withdrawing  1.5 cm. The PA catheter is in good position. Chest tubes and mediastinal drain are stable. There is a tiny left apical pneumothorax. Opacity in the left base is likely a small effusion and atelectasis. Opacity adjacent to the distal left chest tube may represent atelectasis as well. No other acute abnormalities. IMPRESSION: 1. The ETT terminates 1.2 cm above the carina. Recommend withdrawing 1.5 cm. 2. Other support apparatus as above. There is a small left apical pneumothorax with a left chest tube in place. 3. Opacity in left base is likely atelectasis. There is a small associated effusion. 4. No other acute abnormalities. Electronically Signed   By: Dorise Bullion III M.D   On: 11/04/2018 14:08    Cardiac Studies   Left heart cath  Ost Cx to Prox Cx lesion is 99% stenosed.  Ramus-2 lesion is 20% stenosed.  Ramus-1 lesion is 60% stenosed.  Ramus-3 lesion is 70% stenosed.  Prox LAD to Mid LAD lesion is 10% stenosed.  Previously placed Mid LAD to Dist LAD stent (unknown type) is widely patent.  Dist LAD-1 lesion is 99% stenosed.  Dist LAD-2 lesion is 99% stenosed.  There is mild to moderate left ventricular systolic dysfunction.  LV end diastolic pressure is normal.  The left ventricular ejection fraction is 35-45% by visual estimate.  There is no mitral valve regurgitation.  Mid LAD lesion is 60% stenosed.  Prox Cx to Mid Cx lesion is 70% stenosed.  1. Severe double vessel CAD 2. The LAD has a patent ostial/proximal stented segment with no restenosis. The distal stented segment has no restenosis. The mid vessel between stented segments has a moderately severe, eccentric stenosis. The small caliber, apical LAD has diffuse severe serial stenoses.  3. The intermediate branch (early diagonal) has a moderately severe ostial/proximal stenosis followed by a patent mid stented segment. The stented segment has moderate restenosis. Just beyond the stented segment there is a severe  stenosis.  4. The Circumflex has an ostial/proximal stent that has severe restenosis within the stent. The mid Circumflex has a moderately severe stenosis.  5. The RCA is a large dominant artery with no  obstructive disease 6. Moderate segmental LV systolic dysfunction. LVEF around 40%.   Echocardiogram 1. Left ventricular ejection fraction, by visual estimation, is 45 to 50%. The left ventricle has mild to moderately decreased function. Left ventricular septal wall thickness was mildly increased. Normal left ventricular posterior wall thickness. There is mildly increased left ventricular hypertrophy. 2. Inferior septal and apical hypokinesis. 3. Global right ventricle has normal systolic function.The right ventricular size is normal. No increase in right ventricular wall thickness. 4. Left atrial size was normal. 5. Right atrial size was normal. 6. The mitral valve is normal in structure. Mild mitral valve regurgitation. 7. The tricuspid valve is normal in structure. Tricuspid valve regurgitation is mild. 8. The aortic valve is normal in structure. Aortic valve regurgitation was not visualized by color flow Doppler. Mild aortic valve sclerosis without stenosis. 9. The pulmonic valve was grossly normal. Pulmonic valve regurgitation is mild by color flow Doppler. 10. Mildly elevated pulmonary artery systolic pressure.  Patient Profile   48 y.o. female with type 1 IDDM, HLD and CAD s/p multiple prior stents in the last 2 years presents with NSTEMI.  Assessment & Plan    #Multivessel coronary disease #Status post CABG x3 POD 1 Doing well postop.  Complains of chest soreness.  Had an episode of nonbloody nonbilious emesis this morning however this is improved on reexamination.  She required multiple pressors postop however norepinephrine has been discontinued.  She has remained hemodynamically stable with a BP in the 100s/60s. -Continue aspirin, Plavix -Attempt to wean phenylephrine  -On amiodarone per cardiothoracic surgery  #Hyperlipidemia -Continue Lipitor  #Chronic systolic heart failure Found to have an LVEF of 45-50% No evidence of volume overload -Continue metoprolol  #Type 1 diabetes mellitus -Continue insulin drip     For questions or updates, please contact [facility] Please consult [email or web address] for contact info under Cardiology/STEMI.      Signed, Jean Rosenthal, MD  11/05/2018, 7:15 AM    I have examined the patient and reviewed assessment and plan and discussed with patient.  Agree with above as stated.  DOIng well post operatively.  Will have DAPT restarted.  Needs statin as well.  No AFib noted recently on tele.  Amio for now, but may be able to be stopped.   Larae Grooms

## 2018-11-06 ENCOUNTER — Inpatient Hospital Stay (HOSPITAL_COMMUNITY): Payer: Self-pay

## 2018-11-06 LAB — GLUCOSE, CAPILLARY
Glucose-Capillary: 78 mg/dL (ref 70–99)
Glucose-Capillary: 126 mg/dL — ABNORMAL HIGH (ref 70–99)
Glucose-Capillary: 146 mg/dL — ABNORMAL HIGH (ref 70–99)
Glucose-Capillary: 125 mg/dL — ABNORMAL HIGH (ref 70–99)
Glucose-Capillary: 105 mg/dL — ABNORMAL HIGH (ref 70–99)
Glucose-Capillary: 121 mg/dL — ABNORMAL HIGH (ref 70–99)
Glucose-Capillary: 114 mg/dL — ABNORMAL HIGH (ref 70–99)
Glucose-Capillary: 126 mg/dL — ABNORMAL HIGH (ref 70–99)
Glucose-Capillary: 174 mg/dL — ABNORMAL HIGH (ref 70–99)
Glucose-Capillary: 176 mg/dL — ABNORMAL HIGH (ref 70–99)
Glucose-Capillary: 144 mg/dL — ABNORMAL HIGH (ref 70–99)
Glucose-Capillary: 89 mg/dL (ref 70–99)
Glucose-Capillary: 73 mg/dL (ref 70–99)
Glucose-Capillary: 63 mg/dL — ABNORMAL LOW (ref 70–99)
Glucose-Capillary: 112 mg/dL — ABNORMAL HIGH (ref 70–99)
Glucose-Capillary: 149 mg/dL — ABNORMAL HIGH (ref 70–99)
Glucose-Capillary: 201 mg/dL — ABNORMAL HIGH (ref 70–99)

## 2018-11-06 LAB — BASIC METABOLIC PANEL
Anion gap: 5 (ref 5–15)
BUN: 15 mg/dL (ref 6–20)
CO2: 23 mmol/L (ref 22–32)
Calcium: 7.6 mg/dL — ABNORMAL LOW (ref 8.9–10.3)
Chloride: 106 mmol/L (ref 98–111)
Creatinine, Ser: 1.07 mg/dL — ABNORMAL HIGH (ref 0.44–1.00)
GFR calc Af Amer: >60 mL/min (ref >60)
GFR calc non Af Amer: >60 mL/min (ref >60)
Glucose, Bld: 133 mg/dL — ABNORMAL HIGH (ref 70–99)
Potassium: 4.1 mmol/L (ref 3.5–5.1)
Sodium: 134 mmol/L — ABNORMAL LOW (ref 135–145)

## 2018-11-06 LAB — CBC WITH DIFFERENTIAL/PLATELET
Abs Immature Granulocytes: 0.03 10*3/uL (ref 0.00–0.07)
Basophils Absolute: 0 10*3/uL (ref 0.0–0.1)
Basophils Relative: 0 %
Eosinophils Absolute: 0.3 10*3/uL (ref 0.0–0.5)
Eosinophils Relative: 4 %
HCT: 26.2 % — ABNORMAL LOW (ref 36.0–46.0)
Hemoglobin: 8.5 g/dL — ABNORMAL LOW (ref 12.0–15.0)
Immature Granulocytes: 0 %
Lymphocytes Relative: 16 %
Lymphs Abs: 1.4 10*3/uL (ref 0.7–4.0)
MCH: 30.4 pg (ref 26.0–34.0)
MCHC: 32.4 g/dL (ref 30.0–36.0)
MCV: 93.6 fL (ref 80.0–100.0)
Monocytes Absolute: 0.6 10*3/uL (ref 0.1–1.0)
Monocytes Relative: 7 %
Neutro Abs: 6.4 10*3/uL (ref 1.7–7.7)
Neutrophils Relative %: 73 %
Platelets: 162 10*3/uL (ref 150–400)
RBC: 2.8 MIL/uL — ABNORMAL LOW (ref 3.87–5.11)
RDW: 15.7 % — ABNORMAL HIGH (ref 11.5–15.5)
WBC: 8.8 10*3/uL (ref 4.0–10.5)
nRBC: 0 % (ref 0.0–0.2)

## 2018-11-06 MED ORDER — INSULIN PUMP: SUBCUTANEOUS | Status: DC

## 2018-11-06 MED ORDER — INSULIN ASPART 100 UNIT/ML ~~LOC~~ SOLN
300.0000 [IU] | Freq: Once | SUBCUTANEOUS | Status: DC | PRN
  Filled 2018-11-06: qty 3

## 2018-11-06 MED ORDER — ASPIRIN EC 81 MG PO TBEC
81.0000 mg | DELAYED_RELEASE_TABLET | Freq: Every day | ORAL | Status: DC
  Administered 2018-11-06 – 2018-11-09 (×4): 81 mg via ORAL
  Filled 2018-11-06 (×4): qty 1

## 2018-11-06 MED ORDER — CHLORHEXIDINE GLUCONATE 0.12 % MT SOLN
OROMUCOSAL | Status: AC
  Administered 2018-11-06: 15 mL via OROMUCOSAL
  Filled 2018-11-06: qty 15

## 2018-11-06 MED ORDER — INSULIN PUMP
SUBCUTANEOUS | Status: DC
  Administered 2018-11-06: 1.2 via SUBCUTANEOUS
  Administered 2018-11-06 – 2018-11-07 (×2): 0.4 via SUBCUTANEOUS
  Administered 2018-11-07: 12:00:00 via SUBCUTANEOUS
  Administered 2018-11-08: 15:00:00 2.1 via SUBCUTANEOUS
  Administered 2018-11-09 (×3): via SUBCUTANEOUS
  Filled 2018-11-06: qty 1

## 2018-11-06 MED ORDER — ISOSORBIDE DINITRATE 5 MG PO TABS
5.0000 mg | ORAL_TABLET | Freq: Three times a day (TID) | ORAL | Status: DC
  Administered 2018-11-06 – 2018-11-09 (×9): 5 mg via ORAL
  Filled 2018-11-06 (×11): qty 1

## 2018-11-06 NOTE — Plan of Care (Signed)
  Problem: Education: Goal: Knowledge of General Education information will improve Description: Including pain rating scale, medication(s)/side effects and non-pharmacologic comfort measures Outcome: Progressing   Problem: Health Behavior/Discharge Planning: Goal: Ability to manage health-related needs will improve Outcome: Progressing   Problem: Clinical Measurements: Goal: Ability to maintain clinical measurements within normal limits will improve Outcome: Progressing Goal: Will remain free from infection Outcome: Progressing Goal: Diagnostic test results will improve Outcome: Progressing Goal: Respiratory complications will improve Outcome: Progressing Goal: Cardiovascular complication will be avoided Outcome: Progressing   Problem: Activity: Goal: Risk for activity intolerance will decrease Outcome: Progressing   Problem: Coping: Goal: Level of anxiety will decrease Outcome: Progressing   Problem: Elimination: Goal: Will not experience complications related to urinary retention Outcome: Progressing   Problem: Pain Managment: Goal: General experience of comfort will improve Outcome: Progressing   Problem: Safety: Goal: Ability to remain free from injury will improve Outcome: Progressing   Problem: Skin Integrity: Goal: Risk for impaired skin integrity will decrease Outcome: Progressing   Problem: Education: Goal: Will demonstrate proper wound care and an understanding of methods to prevent future damage Outcome: Progressing Goal: Knowledge of disease or condition will improve Outcome: Progressing Goal: Knowledge of the prescribed therapeutic regimen will improve Outcome: Progressing Goal: Individualized Educational Video(s) Outcome: Progressing   Problem: Activity: Goal: Risk for activity intolerance will decrease Outcome: Progressing   Problem: Cardiac: Goal: Will achieve and/or maintain hemodynamic stability Outcome: Progressing   Problem: Clinical  Measurements: Goal: Postoperative complications will be avoided or minimized Outcome: Progressing   Problem: Respiratory: Goal: Respiratory status will improve Outcome: Progressing   Problem: Skin Integrity: Goal: Wound healing without signs and symptoms of infection Outcome: Progressing Goal: Risk for impaired skin integrity will decrease Outcome: Progressing   Problem: Urinary Elimination: Goal: Ability to achieve and maintain adequate renal perfusion and functioning will improve Outcome: Progressing   Problem: Education: Goal: Will demonstrate proper wound care and an understanding of methods to prevent future damage Outcome: Progressing Goal: Knowledge of disease or condition will improve Outcome: Progressing Goal: Knowledge of the prescribed therapeutic regimen will improve Outcome: Progressing   Problem: Activity: Goal: Risk for activity intolerance will decrease Outcome: Progressing   Problem: Cardiac: Goal: Will achieve and/or maintain hemodynamic stability Outcome: Progressing   Problem: Clinical Measurements: Goal: Postoperative complications will be avoided or minimized Outcome: Progressing   Problem: Respiratory: Goal: Respiratory status will improve Outcome: Progressing

## 2018-11-06 NOTE — Plan of Care (Signed)
  Problem: Education: Goal: Knowledge of General Education information will improve Description: Including pain rating scale, medication(s)/side effects and non-pharmacologic comfort measures Outcome: Progressing   Problem: Health Behavior/Discharge Planning: Goal: Ability to manage health-related needs will improve Outcome: Progressing   Problem: Clinical Measurements: Goal: Ability to maintain clinical measurements within normal limits will improve Outcome: Progressing Goal: Will remain free from infection Outcome: Progressing Goal: Diagnostic test results will improve Outcome: Progressing Goal: Respiratory complications will improve Outcome: Progressing Goal: Cardiovascular complication will be avoided Outcome: Progressing   Problem: Activity: Goal: Risk for activity intolerance will decrease Outcome: Progressing   Problem: Coping: Goal: Level of anxiety will decrease Outcome: Progressing   Problem: Elimination: Goal: Will not experience complications related to bowel motility Outcome: Progressing Goal: Will not experience complications related to urinary retention Outcome: Progressing   Problem: Pain Managment: Goal: General experience of comfort will improve Outcome: Progressing   Problem: Safety: Goal: Ability to remain free from injury will improve Outcome: Progressing   Problem: Skin Integrity: Goal: Risk for impaired skin integrity will decrease Outcome: Progressing   Problem: Education: Goal: Will demonstrate proper wound care and an understanding of methods to prevent future damage Outcome: Progressing Goal: Knowledge of disease or condition will improve Outcome: Progressing Goal: Knowledge of the prescribed therapeutic regimen will improve Outcome: Progressing Goal: Individualized Educational Video(s) Outcome: Progressing   Problem: Activity: Goal: Risk for activity intolerance will decrease Outcome: Progressing   Problem: Cardiac: Goal: Will  achieve and/or maintain hemodynamic stability Outcome: Progressing   Problem: Clinical Measurements: Goal: Postoperative complications will be avoided or minimized Outcome: Progressing   Problem: Respiratory: Goal: Respiratory status will improve Outcome: Progressing   Problem: Skin Integrity: Goal: Wound healing without signs and symptoms of infection Outcome: Progressing Goal: Risk for impaired skin integrity will decrease Outcome: Progressing   Problem: Urinary Elimination: Goal: Ability to achieve and maintain adequate renal perfusion and functioning will improve Outcome: Progressing   Problem: Education: Goal: Will demonstrate proper wound care and an understanding of methods to prevent future damage Outcome: Progressing Goal: Knowledge of disease or condition will improve Outcome: Progressing Goal: Knowledge of the prescribed therapeutic regimen will improve Outcome: Progressing Goal: Individualized Educational Video(s) Outcome: Progressing   Problem: Activity: Goal: Risk for activity intolerance will decrease Outcome: Progressing   Problem: Cardiac: Goal: Will achieve and/or maintain hemodynamic stability Outcome: Progressing   Problem: Clinical Measurements: Goal: Postoperative complications will be avoided or minimized Outcome: Progressing   Problem: Respiratory: Goal: Respiratory status will improve Outcome: Progressing   Problem: Skin Integrity: Goal: Wound healing without signs and symptoms of infection Outcome: Progressing Goal: Risk for impaired skin integrity will decrease Outcome: Progressing   Problem: Urinary Elimination: Goal: Ability to achieve and maintain adequate renal perfusion and functioning will improve Outcome: Progressing

## 2018-11-06 NOTE — TOC Initial Note (Signed)
Transition of Care [facility]) - Initial/Assessment Note    Patient Details  Name: Jennifer Hogan MRN: [id number] Date of Birth: [birth year]  [facility]) CM/SW Contact:    Midge Minium RN, BSN, NCM-BC, ACM-RN [phone] Phone Number: 11/06/2018, 1:48 PM  Clinical Narrative:                 Patient lives at home alone and follows at [name] clinic for PCP/prescription needs. 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   Expected Discharge Plan: Home/Self Care Barriers to Discharge: Continued Medical Work up   Patient Goals and CMS Choice     Choice offered to / list presented to : NA  Expected Discharge Plan and Services Expected Discharge Plan: Home/Self Care   Discharge Planning Services: CM Consult Post Acute Care Choice: NA Living arrangements for the past 2 months: Single Family Home                    Prior Living Arrangements/Services Living arrangements for the past 2 months: Single Family Home Lives with:: Self    Activities of Daily Living Home Assistive Devices/Equipment: Insulin Pump ADL Screening (condition at time of admission) Patient's cognitive ability adequate to safely complete daily activities?: Yes Is the patient deaf or have difficulty hearing?: No Does the patient have difficulty seeing, even when wearing glasses/contacts?: No Does the patient have difficulty concentrating, remembering, or making decisions?: Yes(difficulty concentrating) Patient able to express need for assistance with ADLs?: Yes Does the patient have difficulty dressing or bathing?: No Independently performs ADLs?: Yes (appropriate for developmental age) Does the patient have difficulty walking or climbing stairs?: No Weakness of Legs: None Weakness of Arms/Hands: None   Admission diagnosis:  NSTEMI (non-ST elevated myocardial infarction) [facility]) [I21.4] Patient Active Problem List   Diagnosis Date Noted  . Chronic  systolic heart failure ([place])   . Cardiomyopathy ([place])   . Hypotension 10/29/2018  . DKA, type 1 ([place]) 05/24/2018  . Moderate episode of recurrent major depressive disorder ([place]) 03/27/2018  . Epigastric pain   . Hyperglycemia due to type 1 diabetes mellitus ([place]) 02/14/2018  . Hyperlipidemia LDL goal <70 02/14/2018  . S/P drug eluting coronary stent placement   . ACS (acute coronary syndrome) ([place]) 02/08/2018  . ST elevation myocardial infarction (STEMI) ([place]) 02/08/2018  . DKA (diabetic ketoacidosis) ([place]) 12/31/2017  . Marijuana abuse 12/31/2017  . Gastroparesis 12/06/2017  . Gastroparesis due to DM ([place]) 03/28/2017  . Coronary artery disease 03/28/2017  . Ischemic cardiomyopathy 03/28/2017  . Pure hypercholesterolemia   . NSTEMI (non-ST elevated myocardial infarction) ([place])   . Anemia 03/17/2017  . Allergic state 08/07/2015  . Vitiligo 07/09/2012  . Type 1 diabetes mellitus ([place]) 07/09/2012  . Depression 12/04/2010  . Non-intractable vomiting with nausea 12/04/2010  . Hypothyroidism 12/04/2010   PCP:  [name], NP Pharmacy:   [facility] [place] - [id number] [place]) [place] [address] Phone: [phone] Fax: [phone]     Social Determinants of Health (SDOH) Interventions    Readmission Risk Interventions Readmission Risk Prevention Plan 11/06/2018 05/27/2018  Transportation Screening Complete Complete  PCP or Specialist Appt within 3-5 Days Not Complete -  Not Complete comments Continued medical workup -  [place] or Home Care Consult Complete -  Social Work Consult for [place] Planning/Counseling Complete -  Palliative Care Screening Not Applicable -  Medication Review (  RN Care Manager) Complete Complete  PCP or Specialist appointment within 3-5 days of discharge - Complete  [place] or [place] - Complete  SW Recovery Care/Counseling Consult - Complete  [place] - Not Applicable  Some recent data might be hidden

## 2018-11-07 ENCOUNTER — Ambulatory Visit: Payer: Self-pay | Admitting: Cardiology

## 2018-11-07 LAB — GLUCOSE, CAPILLARY
Glucose-Capillary: 101 mg/dL — ABNORMAL HIGH (ref 70–99)
Glucose-Capillary: 106 mg/dL — ABNORMAL HIGH (ref 70–99)
Glucose-Capillary: 147 mg/dL — ABNORMAL HIGH (ref 70–99)
Glucose-Capillary: 73 mg/dL (ref 70–99)
Glucose-Capillary: 81 mg/dL (ref 70–99)
Glucose-Capillary: 96 mg/dL (ref 70–99)
Glucose-Capillary: 96 mg/dL (ref 70–99)

## 2018-11-07 LAB — CBC WITH DIFFERENTIAL/PLATELET
Abs Immature Granulocytes: 0.02 10*3/uL (ref 0.00–0.07)
Basophils Absolute: 0 10*3/uL (ref 0.0–0.1)
Basophils Relative: 0 %
Eosinophils Absolute: 0.3 10*3/uL (ref 0.0–0.5)
Eosinophils Relative: 3 %
HCT: 28 % — ABNORMAL LOW (ref 36.0–46.0)
Hemoglobin: 9.1 g/dL — ABNORMAL LOW (ref 12.0–15.0)
Immature Granulocytes: 0 %
Lymphocytes Relative: 27 %
Lymphs Abs: 2.3 10*3/uL (ref 0.7–4.0)
MCH: 30.3 pg (ref 26.0–34.0)
MCHC: 32.5 g/dL (ref 30.0–36.0)
MCV: 93.3 fL (ref 80.0–100.0)
Monocytes Absolute: 0.5 10*3/uL (ref 0.1–1.0)
Monocytes Relative: 6 %
Neutro Abs: 5.3 10*3/uL (ref 1.7–7.7)
Neutrophils Relative %: 64 %
Platelets: 179 10*3/uL (ref 150–400)
RBC: 3 MIL/uL — ABNORMAL LOW (ref 3.87–5.11)
RDW: 14.8 % (ref 11.5–15.5)
WBC: 8.5 10*3/uL (ref 4.0–10.5)
nRBC: 0 % (ref 0.0–0.2)

## 2018-11-07 LAB — BASIC METABOLIC PANEL
Anion gap: 7 (ref 5–15)
BUN: 13 mg/dL (ref 6–20)
CO2: 23 mmol/L (ref 22–32)
Calcium: 7.6 mg/dL — ABNORMAL LOW (ref 8.9–10.3)
Chloride: 101 mmol/L (ref 98–111)
Creatinine, Ser: 1.04 mg/dL — ABNORMAL HIGH (ref 0.44–1.00)
GFR calc Af Amer: >60 mL/min (ref >60)
GFR calc non Af Amer: >60 mL/min (ref >60)
Glucose, Bld: 119 mg/dL — ABNORMAL HIGH (ref 70–99)
Potassium: 3.9 mmol/L (ref 3.5–5.1)
Sodium: 131 mmol/L — ABNORMAL LOW (ref 135–145)

## 2018-11-07 MED ORDER — PROMETHAZINE HCL 25 MG/ML IJ SOLN
6.2500 mg | Freq: Four times a day (QID) | INTRAMUSCULAR | Status: DC | PRN
  Administered 2018-11-07 – 2018-11-09 (×2): 6.25 mg via INTRAVENOUS
  Filled 2018-11-07 (×2): qty 1

## 2018-11-07 MED ORDER — FUROSEMIDE 10 MG/ML IJ SOLN
20.0000 mg | Freq: Two times a day (BID) | INTRAMUSCULAR | Status: DC
  Administered 2018-11-07: 18:00:00 20 mg via INTRAVENOUS
  Filled 2018-11-07 (×2): qty 2

## 2018-11-07 MED ORDER — COLCHICINE 0.6 MG PO TABS
0.3000 mg | ORAL_TABLET | Freq: Two times a day (BID) | ORAL | Status: DC
  Administered 2018-11-07 – 2018-11-09 (×5): 0.3 mg via ORAL
  Filled 2018-11-07 (×8): qty 0.5

## 2018-11-07 MED FILL — Insulin Aspart Inj 100 Unit/ML: SUBCUTANEOUS | Qty: 10 | Status: AC

## 2018-11-07 NOTE — Progress Notes (Addendum)
CARDIAC REHAB PHASE I   PRE:  Rate/Rhythm: 72 SR  BP:  Supine:   Sitting: 105/81  Standing:    SaO2: 99%RA  MODE:  Ambulation: 370 ft   POST:  Rate/Rhythm: 85 SR  BP:  Supine:   Sitting: 118/78  Standing:    SaO2: 98%RA 1310-1345 Pt ready to walk now. Had nausea earlier so I returned for walk. Pt walked 370 ft with minimal asst and gait steady. I carried wound prevena system  and monitor. HR 85. Pt stopped three times to rest and take deep breaths. Tolerated well. To recliner with call bell. Very positive.   Graylon Good, RN BSN  11/07/2018 1:40 PM

## 2018-11-07 NOTE — Progress Notes (Signed)
3 Days Post-Op Procedure(s) (LRB): CORONARY ARTERY BYPASS GRAFTING (CABG), ON PUMP, TIMES THREE, USING LEFT AND RIGHT INTERNAL MAMMARY ARTERIES (N/A) TRANSESOPHAGEAL ECHOCARDIOGRAM (TEE) (N/A) Subjective: Feeling a little better  Objective: Vital signs in last 24 hours: Temp:  [98.1 F (36.7 C)-98.6 F (37 C)] 98.5 F (36.9 C) (10/14 2359) Pulse Rate:  [64-82] 67 (10/15 0600) Cardiac Rhythm: Normal sinus rhythm (10/15 0400) Resp:  [10-26] 17 (10/15 0600) BP: (84-132)/(55-83) 122/71 (10/15 0600) SpO2:  [95 %-100 %] 100 % (10/15 0600) Weight:  [74.8 kg] 74.8 kg (10/15 0500)  Hemodynamic parameters for last 24 hours:    Intake/Output from previous day: 10/14 0701 - 10/15 0700 In: 2675.5 [P.O.:400; I.V.:2275.5] Out: 1230 [Urine:610; Emesis/NG output:100; Chest Tube:520] Intake/Output this shift: No intake/output data recorded.  General appearance: alert and cooperative Neurologic: intact Heart: regular rate and rhythm, S1, S2 normal, no murmur, click, rub or gallop Lungs: clear to auscultation bilaterally Extremities: edema 2+ Wound: dressed with Prevena  Lab Results: Recent Labs    11/06/18 0353 11/07/18 0356  WBC 8.8 8.5  HGB 8.5* 9.1*  HCT 26.2* 28.0*  PLT 162 179   BMET:  Recent Labs    11/06/18 0353 11/07/18 0356  NA 134* 131*  K 4.1 3.9  CL 106 101  CO2 23 23  GLUCOSE 133* 119*  BUN 15 13  CREATININE 1.07* 1.04*  CALCIUM 7.6* 7.6*    PT/INR:  Recent Labs    11/04/18 1341  LABPROT 17.0*  INR 1.4*   ABG    Component Value Date/Time   PHART 7.327 (L) 11/04/2018 2133   HCO3 22.0 11/04/2018 2133   TCO2 23 11/04/2018 2133   ACIDBASEDEF 4.0 (H) 11/04/2018 2133   O2SAT 99.0 11/04/2018 2133   CBG (last 3)  Recent Labs    11/06/18 2013 11/07/18 0056 11/07/18 0347  GLUCAP 201* 147* 106*    Assessment/Plan: S/P Procedure(s) (LRB): CORONARY ARTERY BYPASS GRAFTING (CABG), ON PUMP, TIMES THREE, USING LEFT AND RIGHT INTERNAL MAMMARY ARTERIES  (N/A) TRANSESOPHAGEAL ECHOCARDIOGRAM (TEE) (N/A) Mobilize Diuresis Plan for transfer to step-down: see transfer orders   LOS: 9 days    Wonda Olds 11/07/2018

## 2018-11-08 ENCOUNTER — Encounter: Payer: Self-pay | Admitting: *Deleted

## 2018-11-08 ENCOUNTER — Inpatient Hospital Stay (HOSPITAL_COMMUNITY): Payer: Self-pay

## 2018-11-08 DIAGNOSIS — Z006 Encounter for examination for normal comparison and control in clinical research program: Secondary | ICD-10-CM

## 2018-11-08 LAB — GLUCOSE, CAPILLARY
Glucose-Capillary: 100 mg/dL — ABNORMAL HIGH (ref 70–99)
Glucose-Capillary: 102 mg/dL — ABNORMAL HIGH (ref 70–99)
Glucose-Capillary: 121 mg/dL — ABNORMAL HIGH (ref 70–99)
Glucose-Capillary: 164 mg/dL — ABNORMAL HIGH (ref 70–99)
Glucose-Capillary: 184 mg/dL — ABNORMAL HIGH (ref 70–99)
Glucose-Capillary: 199 mg/dL — ABNORMAL HIGH (ref 70–99)
Glucose-Capillary: 56 mg/dL — ABNORMAL LOW (ref 70–99)
Glucose-Capillary: 84 mg/dL (ref 70–99)
Glucose-Capillary: 97 mg/dL (ref 70–99)

## 2018-11-08 MED ORDER — FUROSEMIDE 20 MG PO TABS
20.0000 mg | ORAL_TABLET | Freq: Every day | ORAL | Status: DC
  Administered 2018-11-08 – 2018-11-09 (×2): 20 mg via ORAL
  Filled 2018-11-08 (×2): qty 1

## 2018-11-08 NOTE — Progress Notes (Addendum)
Alert and oriented x 4, ambulated independently with minimal assist. Room air, SPO2 96-100%, lungs clear, Pt had strong effort using Incentive spirometer reached up to 2000 ml. Sinus rhythm on monitor, HR 70s-90s, BP stable, remined afebrile.No acute distress.  Pain tolerated well with Tylenol. refused to take narcotic tonight. Pacing wires were not in use, rolled with tape. Sternal wound with wound vac, dressing dry, clean and seal intact with pressure negative 125 mmHg, no drainage appeared. Pt has controlled her Insulin pump. CBG checked q 4 hrs, no signs of hyper/hypoglycemia.  Will continue to monitor.  Kennyth Lose, RN

## 2018-11-08 NOTE — Progress Notes (Signed)
CARDIAC REHAB PHASE I   PRE:  Rate/Rhythm: 95 SR  BP:  Sitting: 119/66      SaO2: 97 RA  MODE:  Ambulation: 1400 ft   POST:  Rate/Rhythm: 114 ST  BP:  Sitting: 129/65    SaO2: 98 RA  Pt ambulated 1414ft in hallway independently, pt just needs someone to carry wound vac. Pt denies CP or SOB. Pt returned to room, lunch arrived. Pt set up for lunch. Encouraged two more walks today, instructed pt not to over do it. Pt agreeable. Pt hopeful for d/c this weekend. Will have CRP I follow up tomorrow.  [id number] Rufina Falco, RN BSN 11/08/2018 2:54 PM

## 2018-11-08 NOTE — Progress Notes (Signed)
4 Days Post-Op Procedure(s) (LRB): CORONARY ARTERY BYPASS GRAFTING (CABG), ON PUMP, TIMES THREE, USING LEFT AND RIGHT INTERNAL MAMMARY ARTERIES (N/A) TRANSESOPHAGEAL ECHOCARDIOGRAM (TEE) (N/A) Subjective: tired  Objective: Vital signs in last 24 hours: Temp:  [97.7 F (36.5 C)-98.7 F (37.1 C)] 98.3 F (36.8 C) (10/16 0752) Pulse Rate:  [68-82] 80 (10/16 0752) Cardiac Rhythm: Normal sinus rhythm (10/16 0316) Resp:  [11-28] 15 (10/16 0752) BP: (105-138)/(61-81) 116/75 (10/16 0752) SpO2:  [95 %-100 %] 98 % (10/16 0752) Weight:  [71 kg] 71 kg (10/16 0630)  Hemodynamic parameters for last 24 hours:    Intake/Output from previous day: 10/15 0701 - 10/16 0700 In: 711.2 [P.O.:610; I.V.:101.2] Out: 15 [Urine:3; Stool:3; Chest Tube:50] Intake/Output this shift: No intake/output data recorded.  General appearance: alert and cooperative Neurologic: intact Heart: regular rate and rhythm, S1, S2 normal, no murmur, click, rub or gallop Wound: dressed  Lab Results: Recent Labs    11/06/18 0353 11/07/18 0356  WBC 8.8 8.5  HGB 8.5* 9.1*  HCT 26.2* 28.0*  PLT 162 179   BMET:  Recent Labs    11/06/18 0353 11/07/18 0356  NA 134* 131*  K 4.1 3.9  CL 106 101  CO2 23 23  GLUCOSE 133* 119*  BUN 15 13  CREATININE 1.07* 1.04*  CALCIUM 7.6* 7.6*    PT/INR: No results for input(s): LABPROT, INR in the last 72 hours. ABG    Component Value Date/Time   PHART 7.327 (L) 11/04/2018 2133   HCO3 22.0 11/04/2018 2133   TCO2 23 11/04/2018 2133   ACIDBASEDEF 4.0 (H) 11/04/2018 2133   O2SAT 99.0 11/04/2018 2133   CBG (last 3)  Recent Labs    11/08/18 0230 11/08/18 0259 11/08/18 0758  GLUCAP 56* 102* 164*    Assessment/Plan: S/P Procedure(s) (LRB): CORONARY ARTERY BYPASS GRAFTING (CABG), ON PUMP, TIMES THREE, USING LEFT AND RIGHT INTERNAL MAMMARY ARTERIES (N/A) TRANSESOPHAGEAL ECHOCARDIOGRAM (TEE) (N/A) Mobilize Diuresis check CXR  Anticipate discharge over weekend   LOS: 10 days    Wonda Olds 11/08/2018

## 2018-11-08 NOTE — Progress Notes (Signed)
Hypoglycemic episode, CBG 56 mg/dl at 02:30am. Pt was feeling dizziness and stumbling on her feet when walked to bathroom with assisted. Pt confirmed that she controlled insulin pump appropriately. Apple juice 2 cups given. Then rechecked CBG went up 102 mg/dl, without any acute distress. Will monitor.   Kennyth Lose, RN

## 2018-11-09 LAB — BASIC METABOLIC PANEL
Anion gap: 8 (ref 5–15)
BUN: 6 mg/dL (ref 6–20)
CO2: 25 mmol/L (ref 22–32)
Calcium: 8.2 mg/dL — ABNORMAL LOW (ref 8.9–10.3)
Chloride: 104 mmol/L (ref 98–111)
Creatinine, Ser: 0.81 mg/dL (ref 0.44–1.00)
GFR calc Af Amer: >60 mL/min (ref >60)
GFR calc non Af Amer: >60 mL/min (ref >60)
Glucose, Bld: 174 mg/dL — ABNORMAL HIGH (ref 70–99)
Potassium: 4.2 mmol/L (ref 3.5–5.1)
Sodium: 137 mmol/L (ref 135–145)

## 2018-11-09 LAB — CBC WITH DIFFERENTIAL/PLATELET
Abs Immature Granulocytes: 0.03 10*3/uL (ref 0.00–0.07)
Basophils Absolute: 0 10*3/uL (ref 0.0–0.1)
Basophils Relative: 0 %
Eosinophils Absolute: 0.2 10*3/uL (ref 0.0–0.5)
Eosinophils Relative: 2 %
HCT: 31.4 % — ABNORMAL LOW (ref 36.0–46.0)
Hemoglobin: 10.5 g/dL — ABNORMAL LOW (ref 12.0–15.0)
Immature Granulocytes: 0 %
Lymphocytes Relative: 26 %
Lymphs Abs: 1.9 10*3/uL (ref 0.7–4.0)
MCH: 30.2 pg (ref 26.0–34.0)
MCHC: 33.4 g/dL (ref 30.0–36.0)
MCV: 90.2 fL (ref 80.0–100.0)
Monocytes Absolute: 0.6 10*3/uL (ref 0.1–1.0)
Monocytes Relative: 8 %
Neutro Abs: 4.5 10*3/uL (ref 1.7–7.7)
Neutrophils Relative %: 64 %
Platelets: 258 10*3/uL (ref 150–400)
RBC: 3.48 MIL/uL — ABNORMAL LOW (ref 3.87–5.11)
RDW: 14.6 % (ref 11.5–15.5)
WBC: 7.2 10*3/uL (ref 4.0–10.5)
nRBC: 0 % (ref 0.0–0.2)

## 2018-11-09 LAB — GLUCOSE, CAPILLARY
Glucose-Capillary: 102 mg/dL — ABNORMAL HIGH (ref 70–99)
Glucose-Capillary: 111 mg/dL — ABNORMAL HIGH (ref 70–99)
Glucose-Capillary: 195 mg/dL — ABNORMAL HIGH (ref 70–99)
Glucose-Capillary: 64 mg/dL — ABNORMAL LOW (ref 70–99)

## 2018-11-09 MED ORDER — ISOSORBIDE DINITRATE 5 MG PO TABS: 5.0000 mg | ORAL_TABLET | Freq: Three times a day (TID) | ORAL | 3 refills | Status: DC

## 2018-11-09 MED ORDER — TRAMADOL HCL 50 MG PO TABS: 50.0000 mg | ORAL_TABLET | ORAL | 0 refills | Status: DC | PRN

## 2018-11-09 MED ORDER — FUROSEMIDE 20 MG PO TABS: 20.0000 mg | ORAL_TABLET | Freq: Every day | ORAL | 0 refills | Status: DC

## 2018-11-09 MED ORDER — ATORVASTATIN CALCIUM 80 MG PO TABS: 80.0000 mg | ORAL_TABLET | Freq: Every day | ORAL | 3 refills | Status: DC

## 2018-11-09 MED ORDER — METOPROLOL TARTRATE 25 MG PO TABS: 12.5000 mg | ORAL_TABLET | Freq: Two times a day (BID) | ORAL | 11 refills | Status: DC

## 2018-11-09 MED ORDER — COLCHICINE 0.6 MG PO TABS: 0.3000 mg | ORAL_TABLET | Freq: Two times a day (BID) | ORAL | 0 refills | Status: DC

## 2018-11-09 NOTE — Plan of Care (Signed)
Continue to monitor

## 2018-11-09 NOTE — Progress Notes (Signed)
IV and telemetry discontinued at this time. Patient tolerated well.

## 2018-11-09 NOTE — Progress Notes (Signed)
Discharge instructions reviewed with patient.  All questions answered.

## 2018-11-09 NOTE — Progress Notes (Signed)
CARDIAC REHAB PHASE I  Pt educated on MI booklet, staying in the tube, diet ([place] & DM), exercise, restrictions, sternal precautions, and referred to CRPII at [facility]. Pt is interested in CRPII as long as she is able to obtain Medicaid insurance.   [id number] - [place], [place], ACSM CEP 11/09/2018 8:45 AM

## 2018-11-09 NOTE — Plan of Care (Signed)
Discharge to home °

## 2018-11-09 NOTE — Progress Notes (Addendum)
      [address]       [place] [id number]             [phone]      5 Days Post-Op Procedure(s) (LRB): CORONARY ARTERY BYPASS GRAFTING (CABG), ON PUMP, TIMES THREE, USING LEFT AND RIGHT INTERNAL MAMMARY ARTERIES (N/A) TRANSESOPHAGEAL ECHOCARDIOGRAM (TEE) (N/A)   Subjective:  Feels great.  Hoping to go home.  Denies pain, shortness of breath.  Objective: Vital signs in last 24 hours: Temp:  [98.2 F (36.8 C)-98.8 F (37.1 C)] 98.2 F (36.8 C) (10/17 0835) Pulse Rate:  [76-101] 91 (10/17 0835) Cardiac Rhythm: Normal sinus rhythm (10/17 0707) Resp:  [17-27] 18 (10/17 0835) BP: (110-138)/(70-81) 132/74 (10/17 0835) SpO2:  [96 %-100 %] 97 % (10/17 0835) Weight:  [68.8 kg] 68.8 kg (10/17 0450)  Intake/Output from previous day: 10/16 0701 - 10/17 0700 In: 240 [P.O.:240] Out: 0   General appearance: alert, cooperative and no distress Heart: regular rate and rhythm and + murmur Lungs: clear to auscultation bilaterally Abdomen: soft, non-tender; bowel sounds normal; no masses,  no organomegaly Extremities: edema trace Wound: clean and dry  Lab Results: Recent Labs    11/07/18 0356 11/09/18 0325  WBC 8.5 7.2  HGB 9.1* 10.5*  HCT 28.0* 31.4*  PLT 179 258   BMET:  Recent Labs    11/07/18 0356 11/09/18 0325  NA 131* 137  K 3.9 4.2  CL 101 104  CO2 23 25  GLUCOSE 119* 174*  BUN 13 6  CREATININE 1.04* 0.81  CALCIUM 7.6* 8.2*    PT/INR: No results for input(s): LABPROT, INR in the last 72 hours. ABG    Component Value Date/Time   PHART 7.327 (L) 11/04/2018 2133   HCO3 22.0 11/04/2018 2133   TCO2 23 11/04/2018 2133   ACIDBASEDEF 4.0 (H) 11/04/2018 2133   O2SAT 99.0 11/04/2018 2133   CBG (last 3)  Recent Labs    11/09/18 0014 11/09/18 0111 11/09/18 0444  GLUCAP 64* 111* 195*    Assessment/Plan: S/P Procedure(s) (LRB): CORONARY ARTERY BYPASS GRAFTING (CABG), ON PUMP, TIMES THREE, USING LEFT AND RIGHT INTERNAL MAMMARY ARTERIES  (N/A) TRANSESOPHAGEAL ECHOCARDIOGRAM (TEE) (N/A)  1. CV- NSR, BP controlled- continue Lopressor, Isordil 2. Pulm- no acute issues, continue IS 3. Renal- creatinine stable, 4. DM- sugars controlled, insulin pump has been restarted  5. Dispo- patient stable, doing well, maintaining NSR, will d/c EPW today, pravena removed. Will plan to d/c home later today if remains stable   LOS: 11 days    Ellwood Handler 11/09/2018  Discharge instructions reviewed with patient patient examined and medical record reviewed,agree with above note. Tharon Aquas Trigt III 11/09/2018

## 2018-11-09 NOTE — Progress Notes (Signed)
Benzoin and steri strips applied to chest tube sites at this time. Patient tolerated well.

## 2018-11-09 NOTE — Progress Notes (Signed)
Note discharge planning by primary surgical team.  Will make arrangements for follow-up with Dr. Radford Pax in the cardiology clinic.

## 2018-11-12 ENCOUNTER — Other Ambulatory Visit: Payer: Self-pay | Admitting: Cardiology

## 2018-11-12 ENCOUNTER — Other Ambulatory Visit: Payer: Self-pay | Admitting: Endocrinology

## 2018-11-12 MED ORDER — CLOPIDOGREL BISULFATE 75 MG PO TABS: 75.0000 mg | ORAL_TABLET | Freq: Every day | ORAL | 0 refills | Status: DC

## 2018-11-12 MED FILL — ?HUMALOG 100 UNITS/ML VIAL: 100 | 28 days supply | Qty: 10 | Fill #0

## 2018-11-13 ENCOUNTER — Telehealth (HOSPITAL_COMMUNITY): Payer: Self-pay

## 2018-11-13 LAB — ECHO INTRAOPERATIVE TEE
Height: 65 in
Weight: 2308.8 oz

## 2018-11-13 NOTE — Telephone Encounter (Signed)
Called and spoke with pt in regards to CR, pt expressed interest. She would like to wait to see if her MD goes thru. Explained scheduling process, patient verbalized understanding. Will contact patient for scheduling once f/u has been completed.

## 2018-11-13 NOTE — Telephone Encounter (Signed)
Pt is pending for MD.

## 2018-11-14 ENCOUNTER — Other Ambulatory Visit: Payer: Self-pay | Admitting: Cardiothoracic Surgery

## 2018-11-14 DIAGNOSIS — Z951 Presence of aortocoronary bypass graft: Secondary | ICD-10-CM

## 2018-11-15 ENCOUNTER — Ambulatory Visit
Admission: RE | Admit: 2018-11-15 | Discharge: 2018-11-15 | Disposition: A | Discharge status: Home / Self Care | Payer: No Typology Code available for payment source | Source: Ambulatory Visit | Attending: Cardiothoracic Surgery | Admitting: Cardiothoracic Surgery

## 2018-11-15 ENCOUNTER — Other Ambulatory Visit: Payer: Self-pay

## 2018-11-15 ENCOUNTER — Ambulatory Visit (INDEPENDENT_AMBULATORY_CARE_PROVIDER_SITE_OTHER): Payer: Self-pay | Admitting: Cardiothoracic Surgery

## 2018-11-15 VITALS — BP 107/69 | HR 90 | Temp 97.7°F | Resp 20 | Ht 65.0 in | Wt 150.0 lb

## 2018-11-15 DIAGNOSIS — I2510 Atherosclerotic heart disease of native coronary artery without angina pectoris: Secondary | ICD-10-CM

## 2018-11-15 DIAGNOSIS — Z951 Presence of aortocoronary bypass graft: Secondary | ICD-10-CM

## 2018-11-15 MED ORDER — FLUOXETINE HCL 40 MG PO CAPS: 40.0000 mg | ORAL_CAPSULE | Freq: Every day | ORAL | 2 refills | Status: DC

## 2018-11-15 MED ORDER — PANTOPRAZOLE SODIUM 40 MG PO TBEC: 40.0000 mg | DELAYED_RELEASE_TABLET | Freq: Two times a day (BID) | ORAL | 1 refills | Status: DC

## 2018-11-15 NOTE — Research (Signed)
[facility] V10  Saw patient while she was in hospital. She is hopefully being discharged tomorrow  came in with Nstemi and went for CABG. She is feeling better.                                    "CONSENT"   YES     NO   Continuing further Investigational Product and study visits for follow-up? [x]  []   Continuing consent from future biomedical research [x]  []                                   "EVENTS"    YES     NO  AE   (IF YES SEE SOURCE) [x]  []   SAE  (IF YES SEE SOURCE) [x]  []   ENDPOINT   (IF YES SEE SOURCE) [x]  []   REVASCULARIZATION  (IF YES SEE SOURCE) [x]  []   AMPUTATION   (IF YES SEE SOURCE) []  [x]   TROPONIN'S  (IF YES SEE SOURCE) [x]  [x] 

## 2018-11-20 ENCOUNTER — Telehealth: Payer: MEDICAID | Admitting: Nurse Practitioner

## 2018-11-20 DIAGNOSIS — N3000 Acute cystitis without hematuria: Secondary | ICD-10-CM

## 2018-11-20 MED ORDER — SULFAMETHOXAZOLE-TRIMETHOPRIM 800-160 MG PO TABS: 1.0000 | ORAL_TABLET | Freq: Two times a day (BID) | ORAL | 0 refills | Status: DC

## 2018-11-20 NOTE — Progress Notes (Signed)
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 She underwent CABG x 3 a few days ago and presents for f/u. No complaints. She has been staying with friends and doing well. Denies angina or SOB. Eager to get back to full activity.    Current Outpatient Medications  Medication Sig Dispense Refill  . acetaminophen (TYLENOL) 325 MG tablet Take 650 mg by mouth every 6 (six) hours as needed for mild pain or moderate pain.    Marland Kitchen aspirin 81 MG EC tablet Take 1 tablet (81 mg total) by mouth daily. 90 tablet 0  . atorvastatin (LIPITOR) 80 MG tablet Take 1 tablet (80 mg total) by mouth daily at 6 PM. 30 tablet 3  . clopidogrel (PLAVIX) 75 MG tablet Take 1 tablet (75 mg total) by mouth daily with breakfast. Please keep upcoming appt in November for future refills. Thank you 90 tablet 0  . colchicine 0.6 MG tablet Take 0.5 tablets (0.3 mg total) by mouth 2 (two) times daily. 10 tablet 0  . FLUoxetine (PROZAC) 40 MG capsule Take 1 capsule (40 mg total) by mouth daily. 30 capsule 0  . furosemide (LASIX) 20 MG tablet Take 1 tablet (20 mg total) by mouth daily. 7 tablet 0  . insulin aspart (NOVOLOG) 100 UNIT/ML injection 35 units per insulin pump daily (Patient taking differently: Inject 35 Units into the skin daily. insulin pump daily) 10 mL 11  . insulin lispro (HUMALOG) 100 UNIT/ML injection Inject 0.35 mLs (35 Units total) into the skin daily. WILL PROVIDE 30 DAY SUPPLY. MUST CALL TO SCHEDULE APPT 10 mL 0  . ipratropium (ATROVENT) 0.06 % nasal spray Place 2 sprays into both nostrils 4 (four) times daily. (Patient taking differently: Place 2 sprays into both nostrils 2 (two) times a day. ) 15 mL 0  . isosorbide  dinitrate (ISORDIL) 5 MG tablet Take 1 tablet (5 mg total) by mouth 3 (three) times daily. 90 tablet 3  . levothyroxine (EUTHYROX) 100 MCG tablet Take 1 tablet (100 mcg total) by mouth daily before breakfast. Must have office visit for refills 30 tablet 0  . metoprolol tartrate (LOPRESSOR) 25 MG tablet Take 0.5 tablets (12.5 mg total) by mouth 2 (two) times daily. Hold for SBP<90 and HR<50 60 tablet 11  . montelukast (SINGULAIR) 10 MG tablet TAKE 1 TABLET BY MOUTH AT BEDTIME (Patient taking differently: Take 10 mg by mouth at bedtime. ) 90 tablet 0  . nitroGLYCERIN (NITROSTAT) 0.4 MG SL tablet Place 1 tablet (0.4 mg total) under the tongue every 5 (five) minutes as needed for chest pain. 30 tablet 0  . pantoprazole (PROTONIX) 40 MG tablet Take 1 tablet (40 mg total) by mouth 2 (two) times daily. (Patient taking differently: Take 40 mg by mouth daily. ) 70 tablet 0  . sucralfate (CARAFATE) 1 GM/10ML suspension Take 10 mLs (1 g total) by mouth 2 (two) times daily for 30 days. (Patient taking differently: Take 1 g by mouth as needed (nausea and vomiting). ) 600 mL 0  . traMADol (ULTRAM) 50 MG tablet Take 1-2 tablets (50-100 mg total) by  mouth every 4 (four) hours as needed for moderate pain. 30 tablet 0  . albuterol (PROVENTIL HFA;VENTOLIN HFA) 108 (90 Base) MCG/ACT inhaler Inhale 2 puffs into the lungs every 6 (six) hours as needed for wheezing or shortness of breath. 1 Inhaler 0  . FLUoxetine (PROZAC) 40 MG capsule Take 1 capsule (40 mg total) by mouth daily. 30 capsule 2  . pantoprazole (PROTONIX) 40 MG tablet Take 1 tablet (40 mg total) by mouth 2 (two) times daily. 30 tablet 1   No current facility-administered medications for this visit.       Physical Exam:   BP 107/69   Pulse 90   Temp 97.7 F (36.5 C) (Skin)   Resp 20   Ht 5\' 5"  (1.651 m)   Wt 68 kg   SpO2 97% Comment: RA  BMI 24.96 kg/m   General:  Well-appearing, NAD  Chest:   cta  CV:   rrr  Incisions:  C/d/i  Abdomen:  sntnd   Extremities:  No edema  Diagnostic Tests:  CXR w/clear lung fields   Impression:  Doing well after CABG  Plan:  F/u in 2 weeks for final post operative check Meds adjusted today  I spent in excess of  25 minutes during the conduct of this office consultation and >50% of this time involved direct face-to-face encounter with the patient for counseling and/or coordination of their care.  Level 2                 10 minutes Level 3                 15 minutes Level 4                 25 minutes Level 5                 40 minutes  B. Murvin Natal, MD 11/20/2018 10:15 AM

## 2018-11-20 NOTE — Progress Notes (Signed)

## 2018-11-23 ENCOUNTER — Inpatient Hospital Stay (HOSPITAL_COMMUNITY)
Admission: EM | Admit: 2018-11-23 | Discharge: 2018-11-26 | DRG: 638 | Disposition: A | Discharge status: Home / Self Care | Payer: Self-pay | Attending: Internal Medicine | Admitting: Internal Medicine

## 2018-11-23 ENCOUNTER — Encounter (HOSPITAL_COMMUNITY): Payer: Self-pay | Admitting: Emergency Medicine

## 2018-11-23 ENCOUNTER — Other Ambulatory Visit: Payer: Self-pay

## 2018-11-23 DIAGNOSIS — K3184 Gastroparesis: Secondary | ICD-10-CM | POA: Diagnosis present

## 2018-11-23 DIAGNOSIS — Z8249 Family history of ischemic heart disease and other diseases of the circulatory system: Secondary | ICD-10-CM

## 2018-11-23 DIAGNOSIS — F1290 Cannabis use, unspecified, uncomplicated: Secondary | ICD-10-CM | POA: Diagnosis present

## 2018-11-23 DIAGNOSIS — E1065 Type 1 diabetes mellitus with hyperglycemia: Secondary | ICD-10-CM | POA: Diagnosis present

## 2018-11-23 DIAGNOSIS — I252 Old myocardial infarction: Secondary | ICD-10-CM

## 2018-11-23 DIAGNOSIS — Z7902 Long term (current) use of antithrombotics/antiplatelets: Secondary | ICD-10-CM

## 2018-11-23 DIAGNOSIS — I255 Ischemic cardiomyopathy: Secondary | ICD-10-CM | POA: Diagnosis present

## 2018-11-23 DIAGNOSIS — Z7982 Long term (current) use of aspirin: Secondary | ICD-10-CM

## 2018-11-23 DIAGNOSIS — F329 Major depressive disorder, single episode, unspecified: Secondary | ICD-10-CM | POA: Diagnosis present

## 2018-11-23 DIAGNOSIS — E1110 Type 2 diabetes mellitus with ketoacidosis without coma: Secondary | ICD-10-CM | POA: Diagnosis present

## 2018-11-23 DIAGNOSIS — E1043 Type 1 diabetes mellitus with diabetic autonomic (poly)neuropathy: Secondary | ICD-10-CM | POA: Diagnosis present

## 2018-11-23 DIAGNOSIS — R739 Hyperglycemia, unspecified: Secondary | ICD-10-CM

## 2018-11-23 DIAGNOSIS — I110 Hypertensive heart disease with heart failure: Secondary | ICD-10-CM | POA: Diagnosis present

## 2018-11-23 DIAGNOSIS — R1013 Epigastric pain: Secondary | ICD-10-CM

## 2018-11-23 DIAGNOSIS — E1010 Type 1 diabetes mellitus with ketoacidosis without coma: Principal | ICD-10-CM | POA: Diagnosis present

## 2018-11-23 DIAGNOSIS — Z20828 Contact with and (suspected) exposure to other viral communicable diseases: Secondary | ICD-10-CM | POA: Diagnosis present

## 2018-11-23 DIAGNOSIS — E109 Type 1 diabetes mellitus without complications: Secondary | ICD-10-CM | POA: Diagnosis present

## 2018-11-23 DIAGNOSIS — E039 Hypothyroidism, unspecified: Secondary | ICD-10-CM | POA: Diagnosis present

## 2018-11-23 DIAGNOSIS — Z91010 Allergy to peanuts: Secondary | ICD-10-CM

## 2018-11-23 DIAGNOSIS — Z79891 Long term (current) use of opiate analgesic: Secondary | ICD-10-CM

## 2018-11-23 DIAGNOSIS — I2510 Atherosclerotic heart disease of native coronary artery without angina pectoris: Secondary | ICD-10-CM | POA: Diagnosis present

## 2018-11-23 DIAGNOSIS — E860 Dehydration: Secondary | ICD-10-CM | POA: Diagnosis present

## 2018-11-23 DIAGNOSIS — D649 Anemia, unspecified: Secondary | ICD-10-CM | POA: Diagnosis present

## 2018-11-23 DIAGNOSIS — I319 Disease of pericardium, unspecified: Secondary | ICD-10-CM | POA: Diagnosis present

## 2018-11-23 DIAGNOSIS — R000 Tachycardia, unspecified: Secondary | ICD-10-CM | POA: Diagnosis present

## 2018-11-23 DIAGNOSIS — Z79899 Other long term (current) drug therapy: Secondary | ICD-10-CM

## 2018-11-23 DIAGNOSIS — Z951 Presence of aortocoronary bypass graft: Secondary | ICD-10-CM

## 2018-11-23 DIAGNOSIS — R6510 Systemic inflammatory response syndrome (SIRS) of non-infectious origin without acute organ dysfunction: Secondary | ICD-10-CM | POA: Diagnosis present

## 2018-11-23 DIAGNOSIS — Z888 Allergy status to other drugs, medicaments and biological substances status: Secondary | ICD-10-CM

## 2018-11-23 DIAGNOSIS — E7800 Pure hypercholesterolemia, unspecified: Secondary | ICD-10-CM | POA: Diagnosis present

## 2018-11-23 DIAGNOSIS — Z79890 Hormone replacement therapy (postmenopausal): Secondary | ICD-10-CM

## 2018-11-23 DIAGNOSIS — Z794 Long term (current) use of insulin: Secondary | ICD-10-CM

## 2018-11-23 DIAGNOSIS — I5022 Chronic systolic (congestive) heart failure: Secondary | ICD-10-CM | POA: Diagnosis present

## 2018-11-23 DIAGNOSIS — Z955 Presence of coronary angioplasty implant and graft: Secondary | ICD-10-CM

## 2018-11-23 DIAGNOSIS — R112 Nausea with vomiting, unspecified: Secondary | ICD-10-CM | POA: Diagnosis present

## 2018-11-23 DIAGNOSIS — J90 Pleural effusion, not elsewhere classified: Secondary | ICD-10-CM | POA: Diagnosis present

## 2018-11-23 DIAGNOSIS — F419 Anxiety disorder, unspecified: Secondary | ICD-10-CM | POA: Diagnosis present

## 2018-11-23 DIAGNOSIS — Z9641 Presence of insulin pump (external) (internal): Secondary | ICD-10-CM | POA: Diagnosis present

## 2018-11-23 DIAGNOSIS — E785 Hyperlipidemia, unspecified: Secondary | ICD-10-CM | POA: Diagnosis present

## 2018-11-23 LAB — URINALYSIS, ROUTINE W REFLEX MICROSCOPIC
Bilirubin Urine: NEGATIVE
Glucose, UA: 150 mg/dL — AB
Hgb urine dipstick: NEGATIVE
Ketones, ur: 20 mg/dL — AB
Leukocytes,Ua: NEGATIVE
Nitrite: NEGATIVE
Protein, ur: 100 mg/dL — AB
Specific Gravity, Urine: 1.025 (ref 1.005–1.030)
Squamous Epithelial / HPF: >50 — ABNORMAL HIGH (ref 0–5)
pH: 6 (ref 5.0–8.0)

## 2018-11-23 LAB — COMPREHENSIVE METABOLIC PANEL
ALT: 12 U/L (ref 0–44)
AST: 15 U/L (ref 15–41)
Albumin: 3.2 g/dL — ABNORMAL LOW (ref 3.5–5.0)
Alkaline Phosphatase: 94 U/L (ref 38–126)
Anion gap: 14 (ref 5–15)
BUN: 12 mg/dL (ref 6–20)
CO2: 19 mmol/L — ABNORMAL LOW (ref 22–32)
Calcium: 9.1 mg/dL (ref 8.9–10.3)
Chloride: 100 mmol/L (ref 98–111)
Creatinine, Ser: 0.98 mg/dL (ref 0.44–1.00)
GFR calc Af Amer: >60 mL/min (ref >60)
GFR calc non Af Amer: >60 mL/min (ref >60)
Glucose, Bld: 238 mg/dL — ABNORMAL HIGH (ref 70–99)
Potassium: 3.7 mmol/L (ref 3.5–5.1)
Sodium: 133 mmol/L — ABNORMAL LOW (ref 135–145)
Total Bilirubin: 0.8 mg/dL (ref 0.3–1.2)
Total Protein: 7 g/dL (ref 6.5–8.1)

## 2018-11-23 LAB — CBC
HCT: 32.5 % — ABNORMAL LOW (ref 36.0–46.0)
Hemoglobin: 10.8 g/dL — ABNORMAL LOW (ref 12.0–15.0)
MCH: 30.3 pg (ref 26.0–34.0)
MCHC: 33.2 g/dL (ref 30.0–36.0)
MCV: 91 fL (ref 80.0–100.0)
Platelets: 733 10*3/uL — ABNORMAL HIGH (ref 150–400)
RBC: 3.57 MIL/uL — ABNORMAL LOW (ref 3.87–5.11)
RDW: 14.6 % (ref 11.5–15.5)
WBC: 8.1 10*3/uL (ref 4.0–10.5)
nRBC: 0 % (ref 0.0–0.2)

## 2018-11-23 LAB — LIPASE, BLOOD: Lipase: 12 U/L (ref 11–51)

## 2018-11-23 MED ORDER — SODIUM CHLORIDE 0.9% FLUSH
3.0000 mL | Freq: Once | INTRAVENOUS | Status: AC
  Administered 2018-11-24: 03:00:00 3 mL via INTRAVENOUS

## 2018-11-23 NOTE — ED Triage Notes (Signed)
Patient reports mid/upper abdominal pain with emesis today , denies chest pain /no fever or chills .

## 2018-11-24 ENCOUNTER — Observation Stay (HOSPITAL_COMMUNITY): Payer: Self-pay

## 2018-11-24 ENCOUNTER — Emergency Department (HOSPITAL_COMMUNITY): Payer: Self-pay

## 2018-11-24 DIAGNOSIS — R000 Tachycardia, unspecified: Secondary | ICD-10-CM | POA: Diagnosis present

## 2018-11-24 LAB — BASIC METABOLIC PANEL
Anion gap: 20 — ABNORMAL HIGH (ref 5–15)
Anion gap: 20 — ABNORMAL HIGH (ref 5–15)
Anion gap: 13 (ref 5–15)
BUN: 9 mg/dL (ref 6–20)
BUN: 10 mg/dL (ref 6–20)
BUN: 10 mg/dL (ref 6–20)
CO2: 13 mmol/L — ABNORMAL LOW (ref 22–32)
CO2: 12 mmol/L — ABNORMAL LOW (ref 22–32)
CO2: 15 mmol/L — ABNORMAL LOW (ref 22–32)
Calcium: 9.1 mg/dL (ref 8.9–10.3)
Calcium: 9.1 mg/dL (ref 8.9–10.3)
Calcium: 8.8 mg/dL — ABNORMAL LOW (ref 8.9–10.3)
Chloride: 103 mmol/L (ref 98–111)
Chloride: 102 mmol/L (ref 98–111)
Chloride: 109 mmol/L (ref 98–111)
Creatinine, Ser: 0.98 mg/dL (ref 0.44–1.00)
Creatinine, Ser: 1 mg/dL (ref 0.44–1.00)
Creatinine, Ser: 1 mg/dL (ref 0.44–1.00)
GFR calc Af Amer: >60 mL/min (ref >60)
GFR calc Af Amer: >60 mL/min (ref >60)
GFR calc Af Amer: >60 mL/min (ref >60)
GFR calc non Af Amer: >60 mL/min (ref >60)
GFR calc non Af Amer: >60 mL/min (ref >60)
GFR calc non Af Amer: >60 mL/min (ref >60)
Glucose, Bld: 339 mg/dL — ABNORMAL HIGH (ref 70–99)
Glucose, Bld: 334 mg/dL — ABNORMAL HIGH (ref 70–99)
Glucose, Bld: 181 mg/dL — ABNORMAL HIGH (ref 70–99)
Potassium: 4.6 mmol/L (ref 3.5–5.1)
Potassium: 4.7 mmol/L (ref 3.5–5.1)
Potassium: 4.2 mmol/L (ref 3.5–5.1)
Sodium: 136 mmol/L (ref 135–145)
Sodium: 134 mmol/L — ABNORMAL LOW (ref 135–145)
Sodium: 137 mmol/L (ref 135–145)

## 2018-11-24 LAB — CBC
HCT: 32.9 % — ABNORMAL LOW (ref 36.0–46.0)
Hemoglobin: 10.6 g/dL — ABNORMAL LOW (ref 12.0–15.0)
MCH: 30 pg (ref 26.0–34.0)
MCHC: 32.2 g/dL (ref 30.0–36.0)
MCV: 93.2 fL (ref 80.0–100.0)
Platelets: 725 10*3/uL — ABNORMAL HIGH (ref 150–400)
RBC: 3.53 MIL/uL — ABNORMAL LOW (ref 3.87–5.11)
RDW: 14.9 % (ref 11.5–15.5)
WBC: 9.2 10*3/uL (ref 4.0–10.5)
nRBC: 0 % (ref 0.0–0.2)

## 2018-11-24 LAB — D-DIMER, QUANTITATIVE: D-Dimer, Quant: >20 ug/mL-FEU — ABNORMAL HIGH (ref 0.00–0.50)

## 2018-11-24 LAB — GLUCOSE, CAPILLARY
Glucose-Capillary: 327 mg/dL — ABNORMAL HIGH (ref 70–99)
Glucose-Capillary: 288 mg/dL — ABNORMAL HIGH (ref 70–99)
Glucose-Capillary: 276 mg/dL — ABNORMAL HIGH (ref 70–99)
Glucose-Capillary: 210 mg/dL — ABNORMAL HIGH (ref 70–99)
Glucose-Capillary: 158 mg/dL — ABNORMAL HIGH (ref 70–99)
Glucose-Capillary: 123 mg/dL — ABNORMAL HIGH (ref 70–99)
Glucose-Capillary: 107 mg/dL — ABNORMAL HIGH (ref 70–99)
Glucose-Capillary: 126 mg/dL — ABNORMAL HIGH (ref 70–99)
Glucose-Capillary: 146 mg/dL — ABNORMAL HIGH (ref 70–99)

## 2018-11-24 LAB — CBG MONITORING, ED: Glucose-Capillary: 345 mg/dL — ABNORMAL HIGH (ref 70–99)

## 2018-11-24 LAB — TROPONIN I (HIGH SENSITIVITY): Troponin I (High Sensitivity): 11 ng/L (ref <18)

## 2018-11-24 LAB — LACTIC ACID, PLASMA
Lactic Acid, Venous: 2 mmol/L (ref 0.5–1.9)
Lactic Acid, Venous: 1.6 mmol/L (ref 0.5–1.9)
Lactic Acid, Venous: 2.2 mmol/L (ref 0.5–1.9)

## 2018-11-24 LAB — MRSA PCR SCREENING: MRSA by PCR: NEGATIVE

## 2018-11-24 LAB — SARS CORONAVIRUS 2 (TAT 6-24 HRS): SARS Coronavirus 2: NEGATIVE

## 2018-11-24 LAB — BETA-HYDROXYBUTYRIC ACID: Beta-Hydroxybutyric Acid: 5.15 mmol/L — ABNORMAL HIGH (ref 0.05–0.27)

## 2018-11-24 MED ORDER — SODIUM CHLORIDE 0.9 % IV SOLN
1.0000 g | INTRAVENOUS | Status: DC
  Administered 2018-11-24 – 2018-11-26 (×3): 1 g via INTRAVENOUS
  Filled 2018-11-24 (×3): qty 10

## 2018-11-24 MED ORDER — TRAMADOL HCL 50 MG PO TABS
50.0000 mg | ORAL_TABLET | ORAL | Status: DC | PRN
  Administered 2018-11-25: 100 mg via ORAL
  Filled 2018-11-24: qty 2

## 2018-11-24 MED ORDER — SODIUM CHLORIDE 0.9 % IV BOLUS
1000.0000 mL | Freq: Once | INTRAVENOUS | Status: AC
  Administered 2018-11-24: 1000 mL via INTRAVENOUS

## 2018-11-24 MED ORDER — POTASSIUM CHLORIDE 10 MEQ/100ML IV SOLN
10.0000 meq | INTRAVENOUS | Status: AC
  Administered 2018-11-24 (×2): 10 meq via INTRAVENOUS
  Filled 2018-11-24 (×2): qty 100

## 2018-11-24 MED ORDER — ONDANSETRON HCL 4 MG/2ML IJ SOLN
4.0000 mg | Freq: Four times a day (QID) | INTRAMUSCULAR | Status: DC | PRN
  Administered 2018-11-24: 4 mg via INTRAVENOUS
  Filled 2018-11-24: qty 2

## 2018-11-24 MED ORDER — DIPHENHYDRAMINE HCL 50 MG/ML IJ SOLN
25.0000 mg | Freq: Once | INTRAMUSCULAR | Status: AC
  Administered 2018-11-24: 25 mg via INTRAVENOUS
  Filled 2018-11-24: qty 1

## 2018-11-24 MED ORDER — HEPARIN BOLUS VIA INFUSION
4000.0000 [IU] | Freq: Once | INTRAVENOUS | Status: AC
  Administered 2018-11-24: 4000 [IU] via INTRAVENOUS
  Filled 2018-11-24: qty 4000

## 2018-11-24 MED ORDER — FENTANYL CITRATE (PF) 100 MCG/2ML IJ SOLN
INTRAMUSCULAR | Status: AC
  Administered 2018-11-24: 03:00:00 100 ug
  Filled 2018-11-24: qty 2

## 2018-11-24 MED ORDER — LEVOTHYROXINE SODIUM 100 MCG PO TABS
100.0000 ug | ORAL_TABLET | Freq: Every day | ORAL | Status: DC
  Administered 2018-11-25 – 2018-11-26 (×2): 100 ug via ORAL
  Filled 2018-11-24 (×2): qty 1

## 2018-11-24 MED ORDER — INSULIN GLARGINE 100 UNIT/ML ~~LOC~~ SOLN
20.0000 [IU] | Freq: Every day | SUBCUTANEOUS | Status: DC
  Administered 2018-11-24: 20 [IU] via SUBCUTANEOUS
  Filled 2018-11-24: qty 0.2

## 2018-11-24 MED ORDER — DEXTROSE-NACL 5-0.45 % IV SOLN
INTRAVENOUS | Status: DC
  Administered 2018-11-24 – 2018-11-25 (×2): via INTRAVENOUS

## 2018-11-24 MED ORDER — SODIUM CHLORIDE 0.9 % IV SOLN: INTRAVENOUS | Status: DC

## 2018-11-24 MED ORDER — ONDANSETRON HCL 4 MG PO TABS
4.0000 mg | ORAL_TABLET | Freq: Four times a day (QID) | ORAL | Status: DC | PRN
  Filled 2018-11-24: qty 1

## 2018-11-24 MED ORDER — PROMETHAZINE HCL 25 MG/ML IJ SOLN
12.5000 mg | Freq: Four times a day (QID) | INTRAMUSCULAR | Status: DC | PRN
  Filled 2018-11-24: qty 1

## 2018-11-24 MED ORDER — INSULIN REGULAR(HUMAN) IN NACL 100-0.9 UT/100ML-% IV SOLN
INTRAVENOUS | Status: DC
  Administered 2018-11-24: 15:00:00 2.3 [IU]/h via INTRAVENOUS
  Filled 2018-11-24: qty 100

## 2018-11-24 MED ORDER — SODIUM CHLORIDE 0.9 % IV SOLN
INTRAVENOUS | Status: DC
  Administered 2018-11-24: 08:00:00 via INTRAVENOUS

## 2018-11-24 MED ORDER — HEPARIN (PORCINE) 25000 UT/250ML-% IV SOLN
1100.0000 [IU]/h | INTRAVENOUS | Status: DC
  Administered 2018-11-24: 14:00:00 1100 [IU]/h via INTRAVENOUS
  Filled 2018-11-24: qty 250

## 2018-11-24 MED ORDER — FENTANYL CITRATE (PF) 100 MCG/2ML IJ SOLN
50.0000 ug | Freq: Once | INTRAMUSCULAR | Status: AC
  Administered 2018-11-24: 50 ug via INTRAVENOUS
  Filled 2018-11-24: qty 2

## 2018-11-24 MED ORDER — PROMETHAZINE HCL 25 MG PO TABS
25.0000 mg | ORAL_TABLET | Freq: Four times a day (QID) | ORAL | Status: DC | PRN
  Filled 2018-11-24: qty 1

## 2018-11-24 MED ORDER — ENOXAPARIN SODIUM 40 MG/0.4ML ~~LOC~~ SOLN: 40.0000 mg | SUBCUTANEOUS | Status: DC

## 2018-11-24 MED ORDER — FLUOXETINE HCL 20 MG PO CAPS
40.0000 mg | ORAL_CAPSULE | Freq: Every day | ORAL | Status: DC
  Administered 2018-11-24 – 2018-11-26 (×3): 40 mg via ORAL
  Filled 2018-11-24 (×3): qty 2

## 2018-11-24 MED ORDER — IOHEXOL 300 MG/ML  SOLN
100.0000 mL | Freq: Once | INTRAMUSCULAR | Status: AC | PRN
  Administered 2018-11-24: 04:00:00 100 mL via INTRAVENOUS

## 2018-11-24 MED ORDER — CLOPIDOGREL BISULFATE 75 MG PO TABS
75.0000 mg | ORAL_TABLET | Freq: Every day | ORAL | Status: DC
  Administered 2018-11-24 – 2018-11-26 (×3): 75 mg via ORAL
  Filled 2018-11-24 (×3): qty 1

## 2018-11-24 MED ORDER — INSULIN ASPART 100 UNIT/ML ~~LOC~~ SOLN: 0.0000 [IU] | Freq: Every day | SUBCUTANEOUS | Status: DC

## 2018-11-24 MED ORDER — PROMETHAZINE HCL 25 MG/ML IJ SOLN
12.5000 mg | Freq: Once | INTRAMUSCULAR | Status: AC
  Administered 2018-11-24: 03:00:00 12.5 mg via INTRAVENOUS
  Filled 2018-11-24: qty 1

## 2018-11-24 MED ORDER — PROMETHAZINE HCL 25 MG RE SUPP
25.0000 mg | Freq: Four times a day (QID) | RECTAL | Status: DC | PRN
  Filled 2018-11-24: qty 1

## 2018-11-24 MED ORDER — SODIUM CHLORIDE 0.9 % IV BOLUS
1000.0000 mL | Freq: Once | INTRAVENOUS | Status: AC
  Administered 2018-11-24: 15:00:00 1000 mL via INTRAVENOUS

## 2018-11-24 MED ORDER — ASPIRIN EC 81 MG PO TBEC
81.0000 mg | DELAYED_RELEASE_TABLET | Freq: Every day | ORAL | Status: DC
  Administered 2018-11-25 – 2018-11-26 (×2): 81 mg via ORAL
  Filled 2018-11-24 (×3): qty 1

## 2018-11-24 MED ORDER — ATORVASTATIN CALCIUM 80 MG PO TABS
80.0000 mg | ORAL_TABLET | Freq: Every day | ORAL | Status: DC
  Administered 2018-11-24 – 2018-11-25 (×2): 80 mg via ORAL
  Filled 2018-11-24 (×2): qty 1

## 2018-11-24 MED ORDER — INSULIN ASPART 100 UNIT/ML ~~LOC~~ SOLN
0.0000 [IU] | Freq: Three times a day (TID) | SUBCUTANEOUS | Status: DC
  Administered 2018-11-24: 12:00:00 5 [IU] via SUBCUTANEOUS

## 2018-11-24 MED ORDER — ONDANSETRON HCL 4 MG/2ML IJ SOLN
INTRAMUSCULAR | Status: AC
  Administered 2018-11-24: 4 mg
  Filled 2018-11-24: qty 2

## 2018-11-24 MED ORDER — SODIUM CHLORIDE 0.9 % IV BOLUS (SEPSIS)
1000.0000 mL | Freq: Once | INTRAVENOUS | Status: AC
  Administered 2018-11-24: 06:00:00 1000 mL via INTRAVENOUS

## 2018-11-24 MED ORDER — METOCLOPRAMIDE HCL 5 MG/ML IJ SOLN
10.0000 mg | Freq: Once | INTRAMUSCULAR | Status: AC
  Administered 2018-11-24: 06:00:00 10 mg via INTRAVENOUS
  Filled 2018-11-24: qty 2

## 2018-11-24 MED ORDER — SODIUM CHLORIDE 0.9 % IV BOLUS (SEPSIS)
1000.0000 mL | Freq: Once | INTRAVENOUS | Status: AC
  Administered 2018-11-24: 03:00:00 1000 mL via INTRAVENOUS

## 2018-11-24 MED ORDER — INSULIN ASPART 100 UNIT/ML ~~LOC~~ SOLN: 0.0000 [IU] | Freq: Three times a day (TID) | SUBCUTANEOUS | Status: DC

## 2018-11-24 MED ORDER — IOHEXOL 350 MG/ML SOLN
100.0000 mL | Freq: Once | INTRAVENOUS | Status: AC | PRN
  Administered 2018-11-24: 14:00:00 62 mL via INTRAVENOUS

## 2018-11-24 NOTE — H&P (Signed)
History and Physical   Jennifer Hogan [id number] DOB: 1970/11/19 DOA: 11/23/2018  Referring MD/NP/PA: Dr. Christy Gentles  PCP: [name], NP   Outpatient Specialists: None  Patient coming from: Home  Chief Complaint: Nausea vomiting abdominal pain  HPI: Jennifer Hogan is a 48 y.o. female with medical history significant of upon diabetes with insulin pump, frequent DKA's, possible gastroparesis, coronary artery disease with recent coronary artery bypass grafting on October 10 who is recuperating from that presenting with nausea and episode of vomiting and persistent abdominal pain mainly in the epigastric region.  Patient was seen and evaluated in the ER.  She was found to be persistently tachycardic.  She is weak.  Patient is hyperglycemic but not in DKA.  She appears dehydrated.  She is being admitted for observation and management of the sinus tachycardia which is persistent.  ED Course: Temperature 99 blood pressure 176/105 pulse 132 respiratory 23 oxygen sat 97% on room air.  Review of Systems: As per HPI otherwise 10 point review of systems negative.  Sodium 133 potassium 3.7 chloride 100 CO2 of 19 glucose 238 BUN 12 creatinine 0.98.  White count 8.1 hemoglobin 10.8 and platelets 733.  Patient is being admitted for observation and treatment   Past Medical History:  Diagnosis Date   Anemia    Anxiety    Coronary artery disease    a. s/pPTCA DES x3 in the LAD (ostial DES placed, mid DES placed, distal DES placed)and PTCA/DES x1to intermediate branch 02/2017. b. ACS 02/08/18 with LCx stent placement.   Depression    Diabetes mellitus    type 1, on insulin pump   DKA (diabetic ketoacidoses) ([place]) 12/31/2017   Elevated platelet count    High cholesterol    Hypothyroidism    Ischemic cardiomyopathy    a. EF low-normal with basal inferior akinesis, basal septal hypokinesis by echo 01/2018.    Past Surgical History:  Procedure Laterality Date   APPENDECTOMY      CORONARY ARTERY BYPASS GRAFT N/A 11/04/2018   Procedure: CORONARY ARTERY BYPASS GRAFTING (CABG), ON PUMP, TIMES THREE, USING LEFT AND RIGHT INTERNAL MAMMARY ARTERIES;  Surgeon: Wonda Olds, MD;  Location: [facility];  Service: Open Heart Surgery;  Laterality: N/A;   CORONARY STENT INTERVENTION N/A 03/20/2017   Procedure: DES x 3 LAD, DES OM; Surgeon: Burnell Blanks, MD;  Location: [facility];  Service: Cardiovascular;  Laterality: N/A;   CORONARY/GRAFT ACUTE MI REVASCULARIZATION N/A 02/08/2018   Procedure: Coronary/Graft Acute MI Revascularization;  Surgeon: Belva Crome, MD;  Location: [facility];  Service: Cardiovascular;  Laterality: N/A;   LEFT HEART CATH AND CORONARY ANGIOGRAPHY N/A 03/20/2017   Procedure: LEFT HEART CATH AND CORONARY ANGIOGRAPHY;  Surgeon: Burnell Blanks, MD;  Location: [facility];  Service: Cardiovascular;  Laterality: N/A;   LEFT HEART CATH AND CORONARY ANGIOGRAPHY N/A 02/08/2018   Procedure: LEFT HEART CATH AND CORONARY ANGIOGRAPHY;  Surgeon: Belva Crome, MD;  Location: [facility];  Service: Cardiovascular;  Laterality: N/A;   LEFT HEART CATH AND CORONARY ANGIOGRAPHY N/A 10/29/2018   Procedure: LEFT HEART CATH AND CORONARY ANGIOGRAPHY;  Surgeon: Burnell Blanks, MD;  Location: [facility];  Service: Cardiovascular;  Laterality: N/A;   TEE WITHOUT CARDIOVERSION N/A 11/04/2018   Procedure: TRANSESOPHAGEAL ECHOCARDIOGRAM (TEE);  Surgeon: Wonda Olds, MD;  Location: [facility];  Service: Open Heart Surgery;  Laterality: N/A;     reports that she has  never smoked. She has never used smokeless tobacco. She reports current drug use. Drug: Marijuana. She reports that she does not drink alcohol.  Allergies  Allergen Reactions   Crestor [Rosuvastatin Calcium] Other (See Comments)    Severe myalgias and joint pain. Has tolerated atorvastatin though   Peanut-Containing Drug Products Other (See  Comments)    Vomiting, upset stomach and some wheezing   Ciprofloxin Hcl [Ciprofloxacin] Nausea And Vomiting and Other (See Comments)    Severe migraine   Food Color Red [Red Dye] Diarrhea    Family History  Problem Relation Age of Onset   Coronary artery disease Father    Congestive Heart Failure Father    Asthma Father    Cancer Mother        T cell lymphoma     Prior to Admission medications   Medication Sig Start Date End Date Taking? Authorizing Provider  acetaminophen (TYLENOL) 325 MG tablet Take 650 mg by mouth every 6 (six) hours as needed for mild pain or moderate pain.    [provider]  albuterol (PROVENTIL HFA;VENTOLIN HFA) 108 (90 Base) MCG/ACT inhaler Inhale 2 puffs into the lungs every 6 (six) hours as needed for wheezing or shortness of breath. 12/07/17 10/30/18  Damita Lack, MD  aspirin 81 MG EC tablet Take 1 tablet (81 mg total) by mouth daily. 12/07/17   Amin, Jeanella Flattery, MD  atorvastatin (LIPITOR) 80 MG tablet Take 1 tablet (80 mg total) by mouth daily at 6 PM. 11/09/18   Barrett, Erin R, PA-C  clopidogrel (PLAVIX) 75 MG tablet Take 1 tablet (75 mg total) by mouth daily with breakfast. Please keep upcoming appt in November for future refills. Thank you 11/12/18   [name], MD  colchicine 0.6 MG tablet Take 0.5 tablets (0.3 mg total) by mouth 2 (two) times daily. 11/09/18   Barrett, Erin R, PA-C  FLUoxetine (PROZAC) 40 MG capsule Take 1 capsule (40 mg total) by mouth daily. 12/07/17 11/15/18  Amin, Jeanella Flattery, MD  FLUoxetine (PROZAC) 40 MG capsule Take 1 capsule (40 mg total) by mouth daily. 11/15/18 11/15/19  Wonda Olds, MD  furosemide (LASIX) 20 MG tablet Take 1 tablet (20 mg total) by mouth daily. 11/09/18   Barrett, Erin R, PA-C  insulin aspart (NOVOLOG) 100 UNIT/ML injection 35 units per insulin pump daily Patient taking differently: Inject 35 Units into the skin daily. insulin pump daily 10/31/17   Renato Shin, MD    insulin lispro (HUMALOG) 100 UNIT/ML injection Inject 0.35 mLs (35 Units total) into the skin daily. WILL PROVIDE 30 DAY SUPPLY. MUST CALL TO SCHEDULE APPT 11/12/18   Renato Shin, MD  ipratropium (ATROVENT) 0.06 % nasal spray Place 2 sprays into both nostrils 4 (four) times daily. Patient taking differently: Place 2 sprays into both nostrils 2 (two) times a day.  02/27/18   Tasia Catchings, Amy V, PA-C  isosorbide dinitrate (ISORDIL) 5 MG tablet Take 1 tablet (5 mg total) by mouth 3 (three) times daily. 11/09/18   Barrett, Lodema Hong, PA-C  levothyroxine (EUTHYROX) 100 MCG tablet Take 1 tablet (100 mcg total) by mouth daily before breakfast. Must have office visit for refills 10/23/18   Charlott Rakes, MD  metoprolol tartrate (LOPRESSOR) 25 MG tablet Take 0.5 tablets (12.5 mg total) by mouth 2 (two) times daily. Hold for SBP<90 and HR<50 11/09/18   Barrett, Erin R, PA-C  montelukast (SINGULAIR) 10 MG tablet TAKE 1 TABLET BY MOUTH AT BEDTIME Patient taking differently: Take 10  mg by mouth at bedtime.  09/16/18   [name], NP  nitroGLYCERIN (NITROSTAT) 0.4 MG SL tablet Place 1 tablet (0.4 mg total) under the tongue every 5 (five) minutes as needed for chest pain. 12/07/17   Amin, Jeanella Flattery, MD  pantoprazole (PROTONIX) 40 MG tablet Take 1 tablet (40 mg total) by mouth 2 (two) times daily. Patient taking differently: Take 40 mg by mouth daily.  03/27/18 11/15/18  [name], NP  pantoprazole (PROTONIX) 40 MG tablet Take 1 tablet (40 mg total) by mouth 2 (two) times daily. 11/15/18 11/15/19  Wonda Olds, MD  sucralfate (CARAFATE) 1 GM/10ML suspension Take 10 mLs (1 g total) by mouth 2 (two) times daily for 30 days. Patient taking differently: Take 1 g by mouth as needed (nausea and vomiting).  05/30/18 11/15/18  [name], NP  sulfamethoxazole-trimethoprim (BACTRIM DS) 800-160 MG tablet Take 1 tablet by mouth 2 (two) times daily. 11/20/18   Hassell Done Caliann-Margaret, FNP  traMADol (ULTRAM) 50 MG  tablet Take 1-2 tablets (50-100 mg total) by mouth every 4 (four) hours as needed for moderate pain. 11/09/18   Freddrick March, PA-C    Physical Exam: Vitals:   11/24/18 0315 11/24/18 0330 11/24/18 0401 11/24/18 0445  BP: (!) 170/93 (!) 162/91 (!) 170/91 (!) 173/95  Pulse: (!) 132  (!) 122 (!) 126  Resp: 14 11 13 13   Temp:      TempSrc:      SpO2: 97%  98% 98%      Constitutional: NAD, calm, comfortable Vitals:   11/24/18 0315 11/24/18 0330 11/24/18 0401 11/24/18 0445  BP: (!) 170/93 (!) 162/91 (!) 170/91 (!) 173/95  Pulse: (!) 132  (!) 122 (!) 126  Resp: 14 11 13 13   Temp:      TempSrc:      SpO2: 97%  98% 98%   Eyes: PERRL, lids and conjunctivae normal ENMT: Mucous membranes are dry. Posterior pharynx clear of any exudate or lesions.Normal dentition.  Neck: normal, supple, no masses, no thyromegaly Respiratory: clear to auscultation bilaterally, no wheezing, no crackles. Normal respiratory effort. No accessory muscle use.  Cardiovascular: Sinus tachycardia, no murmurs / rubs / gallops. No extremity edema. 2+ pedal pulses. No carotid bruits.  Abdomen: no tenderness, no masses palpated. No hepatosplenomegaly. Bowel sounds positive.  Musculoskeletal: no clubbing / cyanosis. No joint deformity upper and lower extremities. Good ROM, no contractures. Normal muscle tone.  Skin: no rashes, lesions, ulcers. No induration Neurologic: CN 2-12 grossly intact. Sensation intact, DTR normal. Strength 5/5 in all 4.  Psychiatric: Normal judgment and insight. Alert and oriented x 3. Normal mood.     Labs on Admission: I have personally reviewed following labs and imaging studies  CBC: Recent Labs  Lab 11/23/18 2223  WBC 8.1  HGB 10.8*  HCT 32.5*  MCV 91.0  PLT [id number]*   Basic Metabolic Panel: Recent Labs  Lab 11/23/18 2223  NA 133*  K 3.7  CL 100  CO2 19*  GLUCOSE 238*  BUN 12  CREATININE 0.98  CALCIUM 9.1   GFR: Estimated Creatinine Clearance: 62.5 mL/min (by C-G  formula based on SCr of 0.98 mg/dL). Liver Function Tests: Recent Labs  Lab 11/23/18 2223  AST 15  ALT 12  ALKPHOS 94  BILITOT 0.8  PROT 7.0  ALBUMIN 3.2*   Recent Labs  Lab 11/23/18 2223  LIPASE 12   No results for input(s): AMMONIA in the last 168 hours. Coagulation Profile: No results  for input(s): INR, PROTIME in the last 168 hours. Cardiac Enzymes: No results for input(s): CKTOTAL, CKMB, CKMBINDEX, TROPONINI in the last 168 hours. BNP (last 3 results) No results for input(s): PROBNP in the last 8760 hours. HbA1C: No results for input(s): HGBA1C in the last 72 hours. CBG: Recent Labs  Lab 11/24/18 0457  GLUCAP 345*   Lipid Profile: No results for input(s): CHOL, HDL, LDLCALC, TRIG, CHOLHDL, LDLDIRECT in the last 72 hours. Thyroid Function Tests: No results for input(s): TSH, T4TOTAL, FREET4, T3FREE, THYROIDAB in the last 72 hours. Anemia Panel: No results for input(s): VITAMINB12, FOLATE, FERRITIN, TIBC, IRON, RETICCTPCT in the last 72 hours. Urine analysis:    Component Value Date/Time   COLORURINE AMBER (A) 11/23/2018 2220   APPEARANCEUR CLOUDY (A) 11/23/2018 2220   LABSPEC 1.025 11/23/2018 2220   PHURINE 6.0 11/23/2018 2220   GLUCOSEU 150 (A) 11/23/2018 2220   GLUCOSEU NEGATIVE 11/23/2015 0831   HGBUR NEGATIVE 11/23/2018 2220   BILIRUBINUR NEGATIVE 11/23/2018 2220   BILIRUBINUR neg 12/07/2015 0843   KETONESUR 20 (A) 11/23/2018 2220   PROTEINUR 100 (A) 11/23/2018 2220   UROBILINOGEN 1.0 12/07/2015 0843   UROBILINOGEN 0.2 11/23/2015 0831   NITRITE NEGATIVE 11/23/2018 2220   LEUKOCYTESUR NEGATIVE 11/23/2018 2220   Sepsis Labs: @LABRCNTIP (procalcitonin:4,lacticidven:4) )No results found for this or any previous visit (from the past 240 hour(s)).   Radiological Exams on Admission: Ct Abdomen Pelvis W Contrast  Result Date: 11/24/2018 CLINICAL DATA:  Mid to upper abdominal pain and vomiting today. EXAM: CT ABDOMEN AND PELVIS WITH CONTRAST TECHNIQUE:  Multidetector CT imaging of the abdomen and pelvis was performed using the standard protocol following bolus administration of intravenous contrast. CONTRAST:  123mL OMNIPAQUE IOHEXOL 300 MG/ML  SOLN COMPARISON:  12/04/2017 FINDINGS: Lower chest: Moderate-sized right pleural effusion with compressive atelectasis of the right lower lobe. Small left pleural effusion. Hepatobiliary: No focal liver abnormality is seen. No gallstones, gallbladder wall thickening, or biliary dilatation. Pancreas: Unremarkable. No pancreatic ductal dilatation or surrounding inflammatory changes. Spleen: Normal in size without focal abnormality. Adrenals/Urinary Tract: Adrenal glands are unremarkable. Kidneys are normal, without renal calculi, focal lesion, or hydronephrosis. Bladder is distended, extending almost to the level of the umbilicus. Stomach/Bowel: Unremarkable stomach, small bowel and colon. Surgically absent appendix. Vascular/Lymphatic: No significant vascular findings are present. No enlarged abdominal or pelvic lymph nodes. Reproductive: Uterus and bilateral adnexa are unremarkable. Other: No abdominal wall hernia or abnormality. No abdominopelvic ascites. Musculoskeletal: Lumbar spine degenerative changes. IMPRESSION: 1. No acute abnormality. 2. Moderate-sized right pleural effusion with compressive atelectasis of the right lower lobe. 3. Small left pleural effusion. 4. Distended urinary bladder, similar to the previous examination. Electronically Signed   By: Claudie Revering M.D.   On: 11/24/2018 04:35    EKG: Independently reviewed.  EKG shows sinus tachycardia with a rate of 130, nonspecific ST changes  Assessment/Plan Principal Problem:   Sinus tachycardia Active Problems:   Non-intractable vomiting with nausea   Hypothyroidism   Type 1 diabetes mellitus (HCC)   Coronary artery disease   Hyperglycemia due to type 1 diabetes mellitus ([place])     #1 sinus tachycardia: Patient will be admitted and hydrated  aggressively.  The tachycardia could be due to dehydration.  Suspected gastroparesis with nausea and vomiting.  It could also be related to her recent cardiac surgery.  At this point we will evaluate patient by close monitoring.  May need to talk to cardiovascular surgery to see if any other work-up is needed.  #  2 non-intractable vomiting with nausea: Vomiting has now stopped.  Symptomatic treatment  #3 type 1 diabetes: Add sliding scale to patient's use of insulin pump.  Not in DKA.  Need to prevent DKA from happening  #4 hypothyroidism: Continue levothyroxine  #5 coronary artery disease: Status post recent bypass grafting.  Defer to cardiovascular surgery  Hypertension: Continue home regimen   DVT prophylaxis: Lovenox Code Status: Full Family Communication: No family at bedside Disposition Plan: Home Consults called: None Admission status: Observation  Severity of Illness: The appropriate patient status for this patient is OBSERVATION. Observation status is judged to be reasonable and necessary in order to provide the required intensity of service to ensure the patient's safety. The patient's presenting symptoms, physical exam findings, and initial radiographic and laboratory data in the context of their medical condition is felt to place them at decreased risk for further clinical deterioration. Furthermore, it is anticipated that the patient will be medically stable for discharge from the hospital within 2 midnights of admission. The following factors support the patient status of observation.   " The patient's presenting symptoms include abdominal pain with nausea. " The physical exam findings include dry mucous membranes. " The initial radiographic and laboratory data are no significant abnormalities.     Barbette Merino MD [facility] Pager [phone]  If 7PM-7AM, please contact night-coverage [email or web address] Password [email or web address]  11/24/2018, 6:03 AM

## 2018-11-24 NOTE — ED Notes (Signed)
Pt returned from CT °

## 2018-11-24 NOTE — Significant Event (Signed)
Rapid Response Event Note  Overview: Time Called: [id number] Arrival Time: 1815 Event Type: Cardiac  Initial Focused Assessment: Patient had a CABG oct 10th was on colchicine and imdure post op.   Currently admitted with DKA She has been tachy since admission, rate 110s-130s.  BP variable 100 -160s/60-80s  RR 18-24  Oral temp 98 Initially she was sleepy and warm to touch and mildly diaphoretic.  Checked rectal temp.  100.2  Afterward she was alert and oriented and very talkative.  Stated she feels much better.  ST 100s,  BP 104-108/60s.  Warm and dry to touch.  Nausea much improved.  On insulin gtt CBG 210, 158  Adjusting per orders  She has a pericardial rub,  Lung sounds clear 12 lead EKG done Recommended Echo, Dr Loleta Books states he will follow up with CT surgery in am.   Interventions:  Plan of Care (if not transferred):  Event Summary: Name of Physician Notified: Dr Loleta Books at 1815    at    Outcome: Stayed in room and stabalized     [place], [place]

## 2018-11-24 NOTE — ED Notes (Signed)
Bedside report given to [facility] RN

## 2018-11-24 NOTE — ED Notes (Addendum)
Verbal order per Dr. Ripley Fraise during downtime on 11/24/2018 at 0230:  Saline lock, IV 100 mcg Fentanyl 4 mg Zofran

## 2018-11-24 NOTE — ED Notes (Signed)
Patient transported to CT 

## 2018-11-24 NOTE — Progress Notes (Signed)
CRITICAL VALUE ALERT  Critical Value:  Lactic Acid   Date & Time Notied:  11/24/2018 11:30  Provider Notified: Myrene Buddy, MD  Orders Received/Actions taken: Paged, awaiting instructions

## 2018-11-24 NOTE — Plan of Care (Signed)
Initiated Care plan Problem: Education: Goal: Knowledge of General Education information will improve Description: Including pain rating scale, medication(s)/side effects and non-pharmacologic comfort measures Outcome: Progressing   Problem: Health Behavior/Discharge Planning: Goal: Ability to manage health-related needs will improve Outcome: Progressing   Problem: Clinical Measurements: Goal: Ability to maintain clinical measurements within normal limits will improve Outcome: Progressing Goal: Will remain free from infection Outcome: Progressing Goal: Diagnostic test results will improve Outcome: Progressing Goal: Respiratory complications will improve Outcome: Progressing Goal: Cardiovascular complication will be avoided Outcome: Progressing   Problem: Nutrition: Goal: Adequate nutrition will be maintained Outcome: Progressing   Problem: Activity: Goal: Risk for activity intolerance will decrease Outcome: Progressing   Problem: Coping: Goal: Level of anxiety will decrease Outcome: Progressing   Problem: Elimination: Goal: Will not experience complications related to bowel motility Outcome: Progressing Goal: Will not experience complications related to urinary retention Outcome: Progressing   Problem: Pain Managment: Goal: General experience of comfort will improve Outcome: Progressing   Problem: Safety: Goal: Ability to remain free from injury will improve Outcome: Progressing   Problem: Skin Integrity: Goal: Risk for impaired skin integrity will decrease Outcome: Progressing

## 2018-11-24 NOTE — Progress Notes (Signed)
ANTICOAGULATION CONSULT NOTE - Initial Consult  Pharmacy Consult for heparin  Indication: pulmonary embolus  Allergies  Allergen Reactions  . Crestor [Rosuvastatin Calcium] Other (See Comments)    Severe myalgias and joint pain. Has tolerated atorvastatin though  . Peanut-Containing Drug Products Other (See Comments)    Vomiting, upset stomach and some wheezing  . Ciprofloxin Hcl [Ciprofloxacin] Nausea And Vomiting and Other (See Comments)    Severe migraine  . Food Color Red [Red Dye] Diarrhea    Patient Measurements:   Heparin Dosing Weight: 68 kg  Vital Signs: BP: 168/88 (11/01 1240) Pulse Rate: 134 (11/01 1240)  Labs: Recent Labs    11/23/18 2223 11/24/18 0156 11/24/18 1030  HGB 10.8*  --  10.6*  HCT 32.5*  --  32.9*  PLT 733*  --  725*  CREATININE 0.98  --  0.98  TROPONINIHS  --  11  --     Estimated Creatinine Clearance: 62.5 mL/min (by C-G formula based on SCr of 0.98 mg/dL).   Medical History: Past Medical History:  Diagnosis Date  . Anemia   . Anxiety   . Coronary artery disease    a. s/pPTCA DES x3 in the LAD (ostial DES placed, mid DES placed, distal DES placed)and PTCA/DES x1to intermediate branch 02/2017. b. ACS 02/08/18 with LCx stent placement.  . Depression   . Diabetes mellitus    type 1, on insulin pump  . DKA (diabetic ketoacidoses) ([place]) 12/31/2017  . Elevated platelet count   . High cholesterol   . Hypothyroidism   . Ischemic cardiomyopathy    a. EF low-normal with basal inferior akinesis, basal septal hypokinesis by echo 01/2018.    Medications:  Scheduled:  . aspirin  81 mg Oral Daily  . atorvastatin  80 mg Oral q1800  . [START ON 11/25/2018] clopidogrel  75 mg Oral Q breakfast  . FLUoxetine  40 mg Oral Daily  . insulin aspart  0-20 Units Subcutaneous TID WC  . insulin aspart  0-5 Units Subcutaneous QHS  . insulin glargine  20 Units Subcutaneous Daily  . [START ON 11/25/2018] levothyroxine  100 mcg Oral QAC breakfast    Infusions:  . sodium chloride 125 mL/hr at 11/24/18 0749  . cefTRIAXone (ROCEPHIN)  IV Stopped (11/24/18 [id number])    Assessment: 48 yo F s/p recent CABG admitted for abdominal pain, N/V. Pharmacy has been consulted to initiate heparin drip for possible pulmonary embolism. D-dimer >20. CTA ordered. Not on anticoagulation prior to admission.   Hgb 10.6, pltc 725  Goal of Therapy:  Heparin level 0.3-0.7 units/ml Monitor platelets by anticoagulation protocol: Yes   Plan:  Heparin 4,000 unit x 1 bolus, then start heparin gtt at 1100 units/hr Check 6 hour heparin level Daily heparin level and CBC Monitor for bleeding  Vertis Kelch, PharmD PGY2 Cardiology Pharmacy Resident Phone [phone] 11/24/2018       1:16 PM  Please check [email or web address] for unit-specific pharmacist phone numbers

## 2018-11-24 NOTE — Progress Notes (Signed)
PROGRESS NOTE    Jennifer Hogan  [id number] DOB: 12-03-1970 DOA: 11/23/2018 PCP: [name], NP      Brief Narrative:  Jennifer Hogan is a 48 y.o. F with DM on insulin pump, frequent DKA, possibly gastroparesis vs other cyclic voimtiing, CAD s/p recent CABG Oct 10 who presented with few days no PO intake, then 1 day vomiting and epigastric pain similar to previous vomiting episodes.  In the ER, Temp 1F, BP elevated, pulse 132.  CT abdomen and pelvis normal, WBC normal.  Given IV fluids and antiemetics and admitted for observation.        Assessment & Plan:  SIRS syndrome Probably this is dehydration and a cyclic vomiting flare. Other alternatives include sepsis, given she had a telemed visit 3 days ago for dysuria, suprapubic pain and was started on Bactrim and her UA has many bacteria.  In addition she still tachycardic and blood pressure is now dropped to 80s over 40s (although she is mentally totally normal, blood pressures up to 97 systolic now which she says is her baseline). Doubt complications from CABG (trop negative, no chest pain, incision looks clean and dry).  Doubt COVID or empyema (CT abd showed effusion).  -Continue fluids -Continue antiemetics  -To rule out sepsis: -Obtain blood cultures -Obtain chest x-ray -Obtain urine culture -Obtain lactic acid -Start empiric ceftriaxone    Diabetes Anion gap and bicarb normal. Glucose is elevated -Continue SSI  -Hold home pump -Start lantus  Hypothyroidism -Continue levothyroxine  Coronary disease secondary prevention Recent CABG Hypertension -Continue aspirin, Lipitor, Plavix -Hold furosemide, Isordil, metoprolol  Depression -Continue Prozac     MDM and disposition: The below labs and imaging reports were reviewed and summarized above.  Medication management as above.  The patient was admitted with SIRS.   Will need to rule out infection, start empiric antibiotics, continue IV  Fluids and IV emetics.  This is a no charge note, plsease see H&P by Dr. Jonelle Sidle     DVT prophylaxis: Lovenox Code Status: FULL Family Communication:     Consultants:     Procedures:     Antimicrobials:   Ceftriaxone 11/1 >>    Subjective: Feeling no chest pain just a little left sided pain, stable since post-op.  She has abdominal cramps, nausea, vomiting still.  No dizziness confusion.  Objective: Vitals:   11/24/18 0545 11/24/18 0600 11/24/18 0700 11/24/18 0734  BP: (!) 169/94 (!) 163/87 99/61 101/64  Pulse: (!) 127 (!) 123  (!) 106  Resp: 19 15 20 20   Temp:      TempSrc:      SpO2: 97% 96% 94% 93%    Intake/Output Summary (Last 24 hours) at 11/24/2018 0817 Last data filed at 11/24/2018 0500 Gross per 24 hour  Intake 1000 ml  Output --  Net 1000 ml   There were no vitals filed for this visit.  Examination: The patient was seen and examined.  Data Reviewed: I have personally reviewed following labs and imaging studies:  CBC: Recent Labs  Lab 11/23/18 2223  WBC 8.1  HGB 10.8*  HCT 32.5*  MCV 91.0  PLT [id number]*   Basic Metabolic Panel: Recent Labs  Lab 11/23/18 2223  NA 133*  K 3.7  CL 100  CO2 19*  GLUCOSE 238*  BUN 12  CREATININE 0.98  CALCIUM 9.1   GFR: Estimated Creatinine Clearance: 62.5 mL/min (by C-G formula based on SCr of 0.98 mg/dL). Liver Function Tests: Recent Labs  Lab 11/23/18 2223  AST 15  ALT 12  ALKPHOS 94  BILITOT 0.8  PROT 7.0  ALBUMIN 3.2*   Recent Labs  Lab 11/23/18 2223  LIPASE 12   No results for input(s): AMMONIA in the last 168 hours. Coagulation Profile: No results for input(s): INR, PROTIME in the last 168 hours. Cardiac Enzymes: No results for input(s): CKTOTAL, CKMB, CKMBINDEX, TROPONINI in the last 168 hours. BNP (last 3 results) No results for input(s): PROBNP in the last 8760 hours. HbA1C: No results for input(s): HGBA1C in the last 72 hours. CBG: Recent Labs  Lab 11/24/18 0457  GLUCAP  345*   Lipid Profile: No results for input(s): CHOL, HDL, LDLCALC, TRIG, CHOLHDL, LDLDIRECT in the last 72 hours. Thyroid Function Tests: No results for input(s): TSH, T4TOTAL, FREET4, T3FREE, THYROIDAB in the last 72 hours. Anemia Panel: No results for input(s): VITAMINB12, FOLATE, FERRITIN, TIBC, IRON, RETICCTPCT in the last 72 hours. Urine analysis:    Component Value Date/Time   COLORURINE AMBER (A) 11/23/2018 2220   APPEARANCEUR CLOUDY (A) 11/23/2018 2220   LABSPEC 1.025 11/23/2018 2220   PHURINE 6.0 11/23/2018 2220   GLUCOSEU 150 (A) 11/23/2018 2220   GLUCOSEU NEGATIVE 11/23/2015 0831   HGBUR NEGATIVE 11/23/2018 2220   BILIRUBINUR NEGATIVE 11/23/2018 2220   BILIRUBINUR neg 12/07/2015 0843   KETONESUR 20 (A) 11/23/2018 2220   PROTEINUR 100 (A) 11/23/2018 2220   UROBILINOGEN 1.0 12/07/2015 0843   UROBILINOGEN 0.2 11/23/2015 0831   NITRITE NEGATIVE 11/23/2018 2220   LEUKOCYTESUR NEGATIVE 11/23/2018 2220   Sepsis Labs: @LABRCNTIP (procalcitonin:4,lacticacidven:4)  )No results found for this or any previous visit (from the past 240 hour(s)).       Radiology Studies: Ct Abdomen Pelvis W Contrast  Result Date: 11/24/2018 CLINICAL DATA:  Mid to upper abdominal pain and vomiting today. EXAM: CT ABDOMEN AND PELVIS WITH CONTRAST TECHNIQUE: Multidetector CT imaging of the abdomen and pelvis was performed using the standard protocol following bolus administration of intravenous contrast. CONTRAST:  132mL OMNIPAQUE IOHEXOL 300 MG/ML  SOLN COMPARISON:  12/04/2017 FINDINGS: Lower chest: Moderate-sized right pleural effusion with compressive atelectasis of the right lower lobe. Small left pleural effusion. Hepatobiliary: No focal liver abnormality is seen. No gallstones, gallbladder wall thickening, or biliary dilatation. Pancreas: Unremarkable. No pancreatic ductal dilatation or surrounding inflammatory changes. Spleen: Normal in size without focal abnormality. Adrenals/Urinary Tract:  Adrenal glands are unremarkable. Kidneys are normal, without renal calculi, focal lesion, or hydronephrosis. Bladder is distended, extending almost to the level of the umbilicus. Stomach/Bowel: Unremarkable stomach, small bowel and colon. Surgically absent appendix. Vascular/Lymphatic: No significant vascular findings are present. No enlarged abdominal or pelvic lymph nodes. Reproductive: Uterus and bilateral adnexa are unremarkable. Other: No abdominal wall hernia or abnormality. No abdominopelvic ascites. Musculoskeletal: Lumbar spine degenerative changes. IMPRESSION: 1. No acute abnormality. 2. Moderate-sized right pleural effusion with compressive atelectasis of the right lower lobe. 3. Small left pleural effusion. 4. Distended urinary bladder, similar to the previous examination. Electronically Signed   By: Claudie Revering M.D.   On: 11/24/2018 04:35        Scheduled Meds:  enoxaparin (LOVENOX) injection  40 mg Subcutaneous Q24H   insulin aspart  0-5 Units Subcutaneous QHS   insulin aspart  0-9 Units Subcutaneous TID WC   Continuous Infusions:  sodium chloride 125 mL/hr at 11/24/18 0749   cefTRIAXone (ROCEPHIN)  IV       LOS: 0 days    Time spent: 15 min    Edwin Dada, MD [facility] 11/24/2018,  8:17 AM     Please page through [employer]:  [email or web address] Password [email or web address] If 7PM-7AM, please contact night-coverage

## 2018-11-24 NOTE — ED Provider Notes (Signed)
[facility] EMERGENCY DEPARTMENT Provider Note   CSN: [id number] Arrival date & time: 11/23/18  2156     History   Chief Complaint Chief Complaint  Patient presents with  . Abdominal Pain    HPI Jennifer Hogan is a 48 y.o. female.     The history is provided by the patient.  Abdominal Pain Pain location:  Epigastric Pain quality: aching and burning   Pain radiates to:  Does not radiate Pain severity:  Severe Onset quality:  Gradual Duration:  12 hours Timing:  Constant Progression:  Worsening Chronicity:  New Relieved by:  Nothing Worsened by:  Movement and palpation Associated symptoms: nausea and vomiting   Associated symptoms: no chest pain, no diarrhea and no fever   Patient with history of CAD, diabetes with insulin pump presents with nausea vomiting abdominal pain.  She denies chest pain.  This started over 12 hours ago.  She reports similar episodes in the past but nothing recent.  Patient with recent CABG earlier in October, no recent issues with this procedure  Past Medical History:  Diagnosis Date  . Anemia   . Anxiety   . Coronary artery disease    a. s/pPTCA DES x3 in the LAD (ostial DES placed, mid DES placed, distal DES placed)and PTCA/DES x1to intermediate branch 02/2017. b. ACS 02/08/18 with LCx stent placement.  . Depression   . Diabetes mellitus    type 1, on insulin pump  . DKA (diabetic ketoacidoses) ([place]) 12/31/2017  . Elevated platelet count   . High cholesterol   . Hypothyroidism   . Ischemic cardiomyopathy    a. EF low-normal with basal inferior akinesis, basal septal hypokinesis by echo 01/2018.    Patient Active Problem List   Diagnosis Date Noted  . Chronic systolic heart failure ([place])   . Cardiomyopathy ([place])   . Hypotension 10/29/2018  . DKA, type 1 ([place]) 05/24/2018  . Moderate episode of recurrent major depressive disorder ([place]) 03/27/2018  . Epigastric pain   . Hyperglycemia due to type 1 diabetes  mellitus ([place]) 02/14/2018  . Hyperlipidemia LDL goal <70 02/14/2018  . S/P drug eluting coronary stent placement   . ACS (acute coronary syndrome) ([place]) 02/08/2018  . ST elevation myocardial infarction (STEMI) ([place]) 02/08/2018  . DKA (diabetic ketoacidosis) ([place]) 12/31/2017  . Marijuana abuse 12/31/2017  . Gastroparesis 12/06/2017  . Gastroparesis due to DM ([place]) 03/28/2017  . Coronary artery disease 03/28/2017  . Ischemic cardiomyopathy 03/28/2017  . Pure hypercholesterolemia   . NSTEMI (non-ST elevated myocardial infarction) ([place])   . Anemia 03/17/2017  . Allergic state 08/07/2015  . Vitiligo 07/09/2012  . Type 1 diabetes mellitus ([place]) 07/09/2012  . Depression 12/04/2010  . Non-intractable vomiting with nausea 12/04/2010  . Hypothyroidism 12/04/2010    Past Surgical History:  Procedure Laterality Date  . APPENDECTOMY    . CORONARY ARTERY BYPASS GRAFT N/A 11/04/2018   Procedure: CORONARY ARTERY BYPASS GRAFTING (CABG), ON PUMP, TIMES THREE, USING LEFT AND RIGHT INTERNAL MAMMARY ARTERIES;  Surgeon: Wonda Olds, MD;  Location: [facility];  Service: Open Heart Surgery;  Laterality: N/A;  . CORONARY STENT INTERVENTION N/A 03/20/2017   Procedure: DES x 3 LAD, DES OM; Surgeon: Burnell Blanks, MD;  Location: [facility];  Service: Cardiovascular;  Laterality: N/A;  . CORONARY/GRAFT ACUTE MI REVASCULARIZATION N/A 02/08/2018   Procedure: Coronary/Graft Acute MI Revascularization;  Surgeon: Belva Crome, MD;  Location: [facility];  Service: Cardiovascular;  Laterality:  N/A;  . LEFT HEART CATH AND CORONARY ANGIOGRAPHY N/A 03/20/2017   Procedure: LEFT HEART CATH AND CORONARY ANGIOGRAPHY;  Surgeon: Burnell Blanks, MD;  Location: [facility];  Service: Cardiovascular;  Laterality: N/A;  . LEFT HEART CATH AND CORONARY ANGIOGRAPHY N/A 02/08/2018   Procedure: LEFT HEART CATH AND CORONARY ANGIOGRAPHY;  Surgeon: Belva Crome, MD;  Location: [facility];   Service: Cardiovascular;  Laterality: N/A;  . LEFT HEART CATH AND CORONARY ANGIOGRAPHY N/A 10/29/2018   Procedure: LEFT HEART CATH AND CORONARY ANGIOGRAPHY;  Surgeon: Burnell Blanks, MD;  Location: [facility];  Service: Cardiovascular;  Laterality: N/A;  . TEE WITHOUT CARDIOVERSION N/A 11/04/2018   Procedure: TRANSESOPHAGEAL ECHOCARDIOGRAM (TEE);  Surgeon: Wonda Olds, MD;  Location: [facility];  Service: Open Heart Surgery;  Laterality: N/A;     OB History    Gravida  0   Para  0   Term  0   Preterm  0   AB  0   Living  0     SAB  0   TAB  0   Ectopic  0   Multiple  0   Live Births  0            Home Medications    Prior to Admission medications   Medication Sig Start Date End Date Taking? Authorizing Provider  acetaminophen (TYLENOL) 325 MG tablet Take 650 mg by mouth every 6 (six) hours as needed for mild pain or moderate pain.    [provider]  albuterol (PROVENTIL HFA;VENTOLIN HFA) 108 (90 Base) MCG/ACT inhaler Inhale 2 puffs into the lungs every 6 (six) hours as needed for wheezing or shortness of breath. 12/07/17 10/30/18  Damita Lack, MD  aspirin 81 MG EC tablet Take 1 tablet (81 mg total) by mouth daily. 12/07/17   Amin, Jeanella Flattery, MD  atorvastatin (LIPITOR) 80 MG tablet Take 1 tablet (80 mg total) by mouth daily at 6 PM. 11/09/18   Barrett, Erin R, PA-C  clopidogrel (PLAVIX) 75 MG tablet Take 1 tablet (75 mg total) by mouth daily with breakfast. Please keep upcoming appt in November for future refills. Thank you 11/12/18   [name], MD  colchicine 0.6 MG tablet Take 0.5 tablets (0.3 mg total) by mouth 2 (two) times daily. 11/09/18   Barrett, Erin R, PA-C  FLUoxetine (PROZAC) 40 MG capsule Take 1 capsule (40 mg total) by mouth daily. 12/07/17 11/15/18  Amin, Jeanella Flattery, MD  FLUoxetine (PROZAC) 40 MG capsule Take 1 capsule (40 mg total) by mouth daily. 11/15/18 11/15/19  Wonda Olds, MD  furosemide (LASIX)  20 MG tablet Take 1 tablet (20 mg total) by mouth daily. 11/09/18   Barrett, Erin R, PA-C  insulin aspart (NOVOLOG) 100 UNIT/ML injection 35 units per insulin pump daily Patient taking differently: Inject 35 Units into the skin daily. insulin pump daily 10/31/17   Renato Shin, MD  insulin lispro (HUMALOG) 100 UNIT/ML injection Inject 0.35 mLs (35 Units total) into the skin daily. WILL PROVIDE 30 DAY SUPPLY. MUST CALL TO SCHEDULE APPT 11/12/18   Renato Shin, MD  ipratropium (ATROVENT) 0.06 % nasal spray Place 2 sprays into both nostrils 4 (four) times daily. Patient taking differently: Place 2 sprays into both nostrils 2 (two) times a day.  02/27/18   Tasia Catchings, Amy V, PA-C  isosorbide dinitrate (ISORDIL) 5 MG tablet Take 1 tablet (5 mg total) by mouth 3 (three) times daily.  11/09/18   Barrett, Lodema Hong, PA-C  levothyroxine (EUTHYROX) 100 MCG tablet Take 1 tablet (100 mcg total) by mouth daily before breakfast. Must have office visit for refills 10/23/18   Charlott Rakes, MD  metoprolol tartrate (LOPRESSOR) 25 MG tablet Take 0.5 tablets (12.5 mg total) by mouth 2 (two) times daily. Hold for SBP<90 and HR<50 11/09/18   Barrett, Erin R, PA-C  montelukast (SINGULAIR) 10 MG tablet TAKE 1 TABLET BY MOUTH AT BEDTIME Patient taking differently: Take 10 mg by mouth at bedtime.  09/16/18   [name], NP  nitroGLYCERIN (NITROSTAT) 0.4 MG SL tablet Place 1 tablet (0.4 mg total) under the tongue every 5 (five) minutes as needed for chest pain. 12/07/17   Amin, Jeanella Flattery, MD  pantoprazole (PROTONIX) 40 MG tablet Take 1 tablet (40 mg total) by mouth 2 (two) times daily. Patient taking differently: Take 40 mg by mouth daily.  03/27/18 11/15/18  [name], NP  pantoprazole (PROTONIX) 40 MG tablet Take 1 tablet (40 mg total) by mouth 2 (two) times daily. 11/15/18 11/15/19  Wonda Olds, MD  sucralfate (CARAFATE) 1 GM/10ML suspension Take 10 mLs (1 g total) by mouth 2 (two) times daily for 30 days. Patient  taking differently: Take 1 g by mouth as needed (nausea and vomiting).  05/30/18 11/15/18  [name], NP  sulfamethoxazole-trimethoprim (BACTRIM DS) 800-160 MG tablet Take 1 tablet by mouth 2 (two) times daily. 11/20/18   Hassell Done Johnell-Margaret, FNP  traMADol (ULTRAM) 50 MG tablet Take 1-2 tablets (50-100 mg total) by mouth every 4 (four) hours as needed for moderate pain. 11/09/18   Barrett, Lodema Hong, PA-C    Family History Family History  Problem Relation Age of Onset  . Coronary artery disease Father   . Congestive Heart Failure Father   . Asthma Father   . Cancer Mother        T cell lymphoma    Social History Social History   Tobacco Use  . Smoking status: Never Smoker  . Smokeless tobacco: Never Used  . Tobacco comment: Use marijuana  Substance Use Topics  . Alcohol use: No    Alcohol/week: 0.0 standard drinks  . Drug use: Yes    Types: Marijuana    Comment: last use last week, occaisonal marijuana use     Allergies   Crestor [rosuvastatin calcium], Peanut-containing drug products, Ciprofloxin hcl [ciprofloxacin], and Food color red [red dye]   Review of Systems Review of Systems  Constitutional: Negative for fever.  Cardiovascular: Negative for chest pain.  Gastrointestinal: Positive for abdominal pain, nausea and vomiting. Negative for diarrhea.  All other systems reviewed and are negative.    Physical Exam Updated Vital Signs BP (!) 176/105   Pulse (!) 120   Temp 99 F (37.2 C) (Oral)   Resp (!) 23   LMP 10/09/2018 (Approximate)   SpO2 99%   Physical Exam CONSTITUTIONAL: Ill-appearing, appears older than stated age HEAD: Normocephalic/atraumatic EYES: EOMI/PERRL, no icterus ENMT: Mucous membranes dry NECK: supple no meningeal signs CV: S1/S2 noted, no murmurs/rubs/gallops noted LUNGS: Lungs are clear to auscultation bilaterally, no apparent distress ABDOMEN: soft, moderate epigastric tenderness, no rebound or guarding, bowel sounds noted  throughout abdomen GU:no cva tenderness NEURO: Pt is awake/alert/appropriate, moves all extremitiesx4.  No facial droop.   EXTREMITIES: pulses normal/equal, full ROM, well-healed sternotomy incisions to chest SKIN: warm, color normal Insulin pump in place to abdomen PSYCH: Anxious  ED Treatments / Results  Labs (all labs  ordered are listed, but only abnormal results are displayed) Labs Reviewed  COMPREHENSIVE METABOLIC PANEL - Abnormal; Notable for the following components:      Result Value   Sodium 133 (*)    CO2 19 (*)    Glucose, Bld 238 (*)    Albumin 3.2 (*)    All other components within normal limits  CBC - Abnormal; Notable for the following components:   RBC 3.57 (*)    Hemoglobin 10.8 (*)    HCT 32.5 (*)    Platelets 733 (*)    All other components within normal limits  URINALYSIS, ROUTINE W REFLEX MICROSCOPIC - Abnormal; Notable for the following components:   Color, Urine AMBER (*)    APPearance CLOUDY (*)    Glucose, UA 150 (*)    Ketones, ur 20 (*)    Protein, ur 100 (*)    Bacteria, UA MANY (*)    Squamous Epithelial / LPF >50 (*)    All other components within normal limits  CBG MONITORING, ED - Abnormal; Notable for the following components:   Glucose-Capillary 345 (*)    All other components within normal limits  SARS CORONAVIRUS 2 (TAT 6-24 HRS)  LIPASE, BLOOD  I-STAT BETA HCG BLOOD, ED ([facility], [facility], [facility] ONLY)  TROPONIN I (HIGH SENSITIVITY)    EKG EKG Interpretation  Date/Time:  Sunday November 24 2018 01:51:59 EST Ventricular Rate:  116 PR Interval:  134 QRS Duration: 82 QT Interval:  342 QTC Calculation: 476 R Axis:   85 Text Interpretation: Sinus tachycardia Ventricular bigeminy Consider right atrial enlargement Nonspecific T abnormalities, lateral leads Abnormal ekg Confirmed by Ripley Fraise [phone]) on 11/24/2018 2:26:18 AM   Radiology Ct Abdomen Pelvis W Contrast  Result Date: 11/24/2018 CLINICAL DATA:  Mid to upper abdominal pain and  vomiting today. EXAM: CT ABDOMEN AND PELVIS WITH CONTRAST TECHNIQUE: Multidetector CT imaging of the abdomen and pelvis was performed using the standard protocol following bolus administration of intravenous contrast. CONTRAST:  16mL OMNIPAQUE IOHEXOL 300 MG/ML  SOLN COMPARISON:  12/04/2017 FINDINGS: Lower chest: Moderate-sized right pleural effusion with compressive atelectasis of the right lower lobe. Small left pleural effusion. Hepatobiliary: No focal liver abnormality is seen. No gallstones, gallbladder wall thickening, or biliary dilatation. Pancreas: Unremarkable. No pancreatic ductal dilatation or surrounding inflammatory changes. Spleen: Normal in size without focal abnormality. Adrenals/Urinary Tract: Adrenal glands are unremarkable. Kidneys are normal, without renal calculi, focal lesion, or hydronephrosis. Bladder is distended, extending almost to the level of the umbilicus. Stomach/Bowel: Unremarkable stomach, small bowel and colon. Surgically absent appendix. Vascular/Lymphatic: No significant vascular findings are present. No enlarged abdominal or pelvic lymph nodes. Reproductive: Uterus and bilateral adnexa are unremarkable. Other: No abdominal wall hernia or abnormality. No abdominopelvic ascites. Musculoskeletal: Lumbar spine degenerative changes. IMPRESSION: 1. No acute abnormality. 2. Moderate-sized right pleural effusion with compressive atelectasis of the right lower lobe. 3. Small left pleural effusion. 4. Distended urinary bladder, similar to the previous examination. Electronically Signed   By: Claudie Revering M.D.   On: 11/24/2018 04:35    Procedures .Critical Care Performed by: Ripley Fraise, MD Authorized by: Ripley Fraise, MD   Critical care provider statement:    Critical care time (minutes):  62   Critical care start time:  11/24/2018 2:58 AM   Critical care end time:  11/24/2018 4:00 AM   Critical care time was exclusive of:  Separately billable procedures and treating  other patients   Critical care was necessary to treat or prevent  imminent or life-threatening deterioration of the following conditions:  Metabolic crisis   Critical care was time spent personally by me on the following activities:  Evaluation of patient's response to treatment, re-evaluation of patient's condition, pulse oximetry, ordering and review of radiographic studies, ordering and review of laboratory studies, ordering and performing treatments and interventions, review of old charts, examination of patient and discussions with consultants   I assumed direction of critical care for this patient from another provider in my specialty: no      Medications Ordered in ED Medications  metoCLOPramide (REGLAN) injection 10 mg (has no administration in time range)  diphenhydrAMINE (BENADRYL) injection 25 mg (has no administration in time range)  sodium chloride 0.9 % bolus 1,000 mL (has no administration in time range)  sodium chloride flush (NS) 0.9 % injection 3 mL (3 mLs Intravenous Given 11/24/18 0232)  ondansetron (ZOFRAN) 4 MG/2ML injection (4 mg  Given 11/24/18 0232)  fentaNYL (SUBLIMAZE) 100 MCG/2ML injection (100 mcg  Given 11/24/18 0232)  fentaNYL (SUBLIMAZE) injection 50 mcg (50 mcg Intravenous Given 11/24/18 0307)  promethazine (PHENERGAN) injection 12.5 mg (12.5 mg Intravenous Given 11/24/18 0308)  sodium chloride 0.9 % bolus 1,000 mL (0 mLs Intravenous Stopped 11/24/18 0500)  iohexol (OMNIPAQUE) 300 MG/ML solution 100 mL (100 mLs Intravenous Contrast Given 11/24/18 0345)     Initial Impression / Assessment and Plan / ED Course  I have reviewed the triage vital signs and the nursing notes.  Pertinent labs & imaging results that were available during my care of the patient were reviewed by me and considered in my medical decision making (see chart for details).        2:58 AM Patient is ill-appearing.  She is having significant abdominal pain and vomiting.  No acute EKG changes to  suggest ischemia.  Will need CT imaging of abdomen/pelvis Patient appears dehydrated by labs but no DKA 5:31 AM Patient monitored for several hours.  CT imaging is negative for acute process.  She reports continued abdominal burning and nausea.  Patient may have diabetic gastroparesis. Will give Reglan. Patient continues to be tachycardic and her glucose is increasing.  She is high risk to deteriorate into DKA. Patient will require admission to the hospital 5:57 AM Discussed with Dr. Jonelle Sidle for admission Final Clinical Impressions(s) / ED Diagnoses   Final diagnoses:  Epigastric pain  Hyperglycemia  Intractable nausea and vomiting  Dehydration    ED Discharge Orders    None       Ripley Fraise, MD 11/24/18 [phone]

## 2018-11-24 NOTE — ED Notes (Signed)
Attempted to give report x2 

## 2018-11-24 NOTE — Progress Notes (Signed)
Off floor for CT scan.

## 2018-11-24 NOTE — ED Notes (Signed)
Pt rolled upstairs for bedside report

## 2018-11-24 NOTE — ED Notes (Signed)
Pt tolerating PO fluids, has some nausea

## 2018-11-25 ENCOUNTER — Ambulatory Visit (HOSPITAL_BASED_OUTPATIENT_CLINIC_OR_DEPARTMENT_OTHER): Payer: Self-pay

## 2018-11-25 ENCOUNTER — Encounter (HOSPITAL_COMMUNITY): Payer: Self-pay

## 2018-11-25 DIAGNOSIS — E1110 Type 2 diabetes mellitus with ketoacidosis without coma: Secondary | ICD-10-CM | POA: Diagnosis present

## 2018-11-25 DIAGNOSIS — I313 Pericardial effusion (noninflammatory): Secondary | ICD-10-CM

## 2018-11-25 LAB — BASIC METABOLIC PANEL
Anion gap: 10 (ref 5–15)
Anion gap: 8 (ref 5–15)
BUN: 11 mg/dL (ref 6–20)
BUN: 10 mg/dL (ref 6–20)
CO2: 14 mmol/L — ABNORMAL LOW (ref 22–32)
CO2: 17 mmol/L — ABNORMAL LOW (ref 22–32)
Calcium: 8.2 mg/dL — ABNORMAL LOW (ref 8.9–10.3)
Calcium: 8.4 mg/dL — ABNORMAL LOW (ref 8.9–10.3)
Chloride: 112 mmol/L — ABNORMAL HIGH (ref 98–111)
Chloride: 112 mmol/L — ABNORMAL HIGH (ref 98–111)
Creatinine, Ser: 0.76 mg/dL (ref 0.44–1.00)
Creatinine, Ser: 0.68 mg/dL (ref 0.44–1.00)
GFR calc Af Amer: >60 mL/min (ref >60)
GFR calc Af Amer: >60 mL/min (ref >60)
GFR calc non Af Amer: >60 mL/min (ref >60)
GFR calc non Af Amer: >60 mL/min (ref >60)
Glucose, Bld: 226 mg/dL — ABNORMAL HIGH (ref 70–99)
Glucose, Bld: 152 mg/dL — ABNORMAL HIGH (ref 70–99)
Potassium: 4.6 mmol/L (ref 3.5–5.1)
Potassium: 3.7 mmol/L (ref 3.5–5.1)
Sodium: 136 mmol/L (ref 135–145)
Sodium: 137 mmol/L (ref 135–145)

## 2018-11-25 LAB — CBC
HCT: 31.4 % — ABNORMAL LOW (ref 36.0–46.0)
Hemoglobin: 10 g/dL — ABNORMAL LOW (ref 12.0–15.0)
MCH: 29.9 pg (ref 26.0–34.0)
MCHC: 31.8 g/dL (ref 30.0–36.0)
MCV: 94 fL (ref 80.0–100.0)
Platelets: 669 10*3/uL — ABNORMAL HIGH (ref 150–400)
RBC: 3.34 MIL/uL — ABNORMAL LOW (ref 3.87–5.11)
RDW: 15.5 % (ref 11.5–15.5)
WBC: 10.1 10*3/uL (ref 4.0–10.5)
nRBC: 0 % (ref 0.0–0.2)

## 2018-11-25 LAB — ECHOCARDIOGRAM COMPLETE

## 2018-11-25 LAB — GLUCOSE, CAPILLARY
Glucose-Capillary: 102 mg/dL — ABNORMAL HIGH (ref 70–99)
Glucose-Capillary: 109 mg/dL — ABNORMAL HIGH (ref 70–99)
Glucose-Capillary: 128 mg/dL — ABNORMAL HIGH (ref 70–99)
Glucose-Capillary: 141 mg/dL — ABNORMAL HIGH (ref 70–99)
Glucose-Capillary: 144 mg/dL — ABNORMAL HIGH (ref 70–99)
Glucose-Capillary: 152 mg/dL — ABNORMAL HIGH (ref 70–99)
Glucose-Capillary: 153 mg/dL — ABNORMAL HIGH (ref 70–99)
Glucose-Capillary: 160 mg/dL — ABNORMAL HIGH (ref 70–99)
Glucose-Capillary: 192 mg/dL — ABNORMAL HIGH (ref 70–99)
Glucose-Capillary: 201 mg/dL — ABNORMAL HIGH (ref 70–99)
Glucose-Capillary: 204 mg/dL — ABNORMAL HIGH (ref 70–99)
Glucose-Capillary: 61 mg/dL — ABNORMAL LOW (ref 70–99)
Glucose-Capillary: 94 mg/dL (ref 70–99)
Glucose-Capillary: 96 mg/dL (ref 70–99)

## 2018-11-25 LAB — COMPREHENSIVE METABOLIC PANEL
ALT: 10 U/L (ref 0–44)
AST: 15 U/L (ref 15–41)
Albumin: 2.6 g/dL — ABNORMAL LOW (ref 3.5–5.0)
Alkaline Phosphatase: 77 U/L (ref 38–126)
Anion gap: 12 (ref 5–15)
BUN: 8 mg/dL (ref 6–20)
CO2: 15 mmol/L — ABNORMAL LOW (ref 22–32)
Calcium: 8.2 mg/dL — ABNORMAL LOW (ref 8.9–10.3)
Chloride: 109 mmol/L (ref 98–111)
Creatinine, Ser: 0.78 mg/dL (ref 0.44–1.00)
GFR calc Af Amer: >60 mL/min (ref >60)
GFR calc non Af Amer: >60 mL/min (ref >60)
Glucose, Bld: 160 mg/dL — ABNORMAL HIGH (ref 70–99)
Potassium: 4 mmol/L (ref 3.5–5.1)
Sodium: 136 mmol/L (ref 135–145)
Total Bilirubin: 0.6 mg/dL (ref 0.3–1.2)
Total Protein: 6.2 g/dL — ABNORMAL LOW (ref 6.5–8.1)

## 2018-11-25 LAB — URINE CULTURE: Culture: NO GROWTH

## 2018-11-25 LAB — BETA-HYDROXYBUTYRIC ACID: Beta-Hydroxybutyric Acid: 0.11 mmol/L (ref 0.05–0.27)

## 2018-11-25 MED ORDER — INSULIN GLARGINE 100 UNIT/ML ~~LOC~~ SOLN
10.0000 [IU] | SUBCUTANEOUS | Status: DC
  Administered 2018-11-25: 05:00:00 10 [IU] via SUBCUTANEOUS
  Filled 2018-11-25: qty 0.1

## 2018-11-25 MED ORDER — COLCHICINE 0.6 MG PO TABS
0.6000 mg | ORAL_TABLET | Freq: Two times a day (BID) | ORAL | Status: DC
  Administered 2018-11-25 – 2018-11-26 (×3): 0.6 mg via ORAL
  Filled 2018-11-25 (×3): qty 1

## 2018-11-25 MED ORDER — INSULIN PUMP
Freq: Three times a day (TID) | SUBCUTANEOUS | Status: DC
  Administered 2018-11-25: 4.3 via SUBCUTANEOUS
  Administered 2018-11-26: 3.9 via SUBCUTANEOUS
  Administered 2018-11-26: 09:00:00 1.3 via SUBCUTANEOUS
  Filled 2018-11-25: qty 1

## 2018-11-25 MED ORDER — INSULIN ASPART 100 UNIT/ML ~~LOC~~ SOLN: 0.0000 [IU] | SUBCUTANEOUS | Status: DC

## 2018-11-25 MED ORDER — KETOROLAC TROMETHAMINE 15 MG/ML IJ SOLN
7.5000 mg | Freq: Four times a day (QID) | INTRAMUSCULAR | Status: AC
  Administered 2018-11-25 – 2018-11-26 (×5): 7.5 mg via INTRAVENOUS
  Filled 2018-11-25 (×5): qty 1

## 2018-11-25 NOTE — Progress Notes (Signed)
PROGRESS NOTE    Jennifer Hogan  [id number] DOB: February 26, 1970 DOA: 11/23/2018 PCP: [name], NP      Brief Narrative:  Jennifer Hogan is a 48 y.o. F with DM on insulin pump, frequent DKA, possibly gastroparesis vs other cyclic voimtiing, CAD s/p recent CABG Oct 10 who presented with few days no PO intake, then 1 day vomiting and epigastric pain similar to previous vomiting episodes.  In the ER, Temp 22F, BP elevated, pulse 132.  CT abdomen and pelvis normal, WBC normal.  Given IV fluids and antiemetics and admitted for observation.        Assessment & Plan:  DKA Gap widened and hR increased rapidly through morning yesterday.  Patient's subq insulin stopped and she was started on Insulin drip and gap closed overnight.    This morning, BHOB normalized, her drip was stopped and she was restarted on home insulin pump settings.  Glucoses have been excellent, her symptoms are completely resolved.    SIRS, sepsis unlikely but not yet ruled out This was all from DKA, likely.  CT chest showed effusions but no associated pneumonia, urine with no growth. Given her recent surgery, and DKA, I feel the risk of premature discharge outweighs benefits.  -Continue ceftriaxone -If blood cultures negative at 48 hours tomorrow AM, will stop antibiotics and discharge home  Post-CABG pericarditis and pleuritis/effusion  Echo report reviewed, she has no pericardial effusion.  The bilateral pleural effusions are small to moderate in size. -Consult to CT surgery, appreciate attentions -Patient reports Dr. Orvan Seen came by this morning, I see he started colchicine and Toradol -She states he planned to follow this is in his office.     Hypothyroidism -Continue levothyroxine  Coronary disease secondary prevention Recent CABG Hypertension BP well controlled -Continue aspirin, Lipitor, Plavix -Hold furosemide, Isordil, metoprolol  Depression -Continue Prozac          MDM  and disposition: The below labs and imaging reports reviewed and summarized above.  Medication management as above.   The patient was admitted with SIRS.  Not clear that infection didn't precipitate this.  Gap now closed and back on home insulin and symptoms reoslved and oral intake good, however, blood cultures still pending  Will contineu IV antibiotics until tomorrow morning.  If cultures negative at 48 hours, will stop antibiotics and d/c home.  Has close follow up arranged with Dr. Orvan Seen for pericarditis        DVT prophylaxis: Lovenox Code Status: FULL Family Communication:     Consultants:   CT surgery  Procedures:   11/2 echocardiogram -- normal post-cabg wall motion, EF 50s, no pericardial effusion  Antimicrobials:   Ceftriaxone 11/1 >>    Subjective: No chest pain, pain with inspiration, just some intermittent left sided chest pain, essentially no change from immediatley post surgery.  No fever.  No vomiting today, appetite good, no confusion, no dysuria, no cough or sputum.        Objective: Vitals:   11/25/18 0414 11/25/18 0726 11/25/18 1049 11/25/18 1601  BP: 106/66 98/70 112/70 112/69  Pulse: 91 96    Resp: 12 17 20 10   Temp: 98.2 F (36.8 C) 98 F (36.7 C) 97.8 F (36.6 C) 98.7 F (37.1 C)  TempSrc: Oral Oral Oral Oral  SpO2: 94% 96%      Intake/Output Summary (Last 24 hours) at 11/25/2018 1715 Last data filed at 11/25/2018 0400 Gross per 24 hour  Intake 2816.7 ml  Output --  Net  2816.7 ml   There were no vitals filed for this visit.  Examination: General appearance:  adult female, alert and in no acute distress. Eating lunch   HEENT: Anicteric, conjunctiva pink, lids and lashes normal. No nasal deformity, discharge, epistaxis.  Lips moist, teeth normal. OP moist, no oral lesions.   Skin: Warm and dry.  No suspicious rashes or lesions. Cardiac: RRR.  There is a friction rub with each beat.  No JVD or pulsus.  No LE edema.     Respiratory: Normal respiratory rate and rhythm.  There appears to be a coarse pleural rub as well, I hear it best in anterior left fields.  Otherwise CTAB without rales or wheezes. Abdomen: Abdomen soft.  No TTP or guarding. No ascites, distension, hepatosplenomegaly.   MSK: No deformities or effusions of the large joints of the upper or lower extremities bilaterally. Neuro: Awake and alert. Naming is grossly intact, and the patient's recall, recent and remote, as well as general fund of knowledge seem within normal limits.  Muscle tone normal, without fasciculations.  Moves all extremities equally and with normal coordination.  Marland Kitchen Speech fluent.    Psych: Sensorium intact and responding to questions, attention normal. Affect normal.  Judgment and insight appear normal.    Data Reviewed: I have personally reviewed following labs and imaging studies:  CBC: Recent Labs  Lab 11/23/18 2223 11/24/18 1030 11/25/18 0612  WBC 8.1 9.2 10.1  HGB 10.8* 10.6* 10.0*  HCT 32.5* 32.9* 31.4*  MCV 91.0 93.2 94.0  PLT 733* 725* 0000000*   Basic Metabolic Panel: Recent Labs  Lab 11/24/18 1500 11/24/18 1802 11/24/18 2315 11/25/18 0227 11/25/18 0612  NA 134* 137 136 137 136  K 4.7 4.2 4.6 3.7 4.0  CL 102 109 112* 112* 109  CO2 12* 15* 14* 17* 15*  GLUCOSE 334* 181* 226* 152* 160*  BUN 10 10 11 10 8   CREATININE 1.00 1.00 0.76 0.68 0.78  CALCIUM 9.1 8.8* 8.2* 8.4* 8.2*   GFR: Estimated Creatinine Clearance: 76.5 mL/min (by C-G formula based on SCr of 0.78 mg/dL). Liver Function Tests: Recent Labs  Lab 11/23/18 2223 11/25/18 0612  AST 15 15  ALT 12 10  ALKPHOS 94 77  BILITOT 0.8 0.6  PROT 7.0 6.2*  ALBUMIN 3.2* 2.6*   Recent Labs  Lab 11/23/18 2223  LIPASE 12   No results for input(s): AMMONIA in the last 168 hours. Coagulation Profile: No results for input(s): INR, PROTIME in the last 168 hours. Cardiac Enzymes: No results for input(s): CKTOTAL, CKMB, CKMBINDEX, TROPONINI in the  last 168 hours. BNP (last 3 results) No results for input(s): PROBNP in the last 8760 hours. HbA1C: No results for input(s): HGBA1C in the last 72 hours. CBG: Recent Labs  Lab 11/25/18 0501 11/25/18 0608 11/25/18 0852 11/25/18 1233 11/25/18 1602  GLUCAP 102* 152* 141* 160* 144*   Lipid Profile: No results for input(s): CHOL, HDL, LDLCALC, TRIG, CHOLHDL, LDLDIRECT in the last 72 hours. Thyroid Function Tests: No results for input(s): TSH, T4TOTAL, FREET4, T3FREE, THYROIDAB in the last 72 hours. Anemia Panel: No results for input(s): VITAMINB12, FOLATE, FERRITIN, TIBC, IRON, RETICCTPCT in the last 72 hours. Urine analysis:    Component Value Date/Time   COLORURINE AMBER (A) 11/23/2018 2220   APPEARANCEUR CLOUDY (A) 11/23/2018 2220   LABSPEC 1.025 11/23/2018 2220   PHURINE 6.0 11/23/2018 2220   GLUCOSEU 150 (A) 11/23/2018 2220   [place] 11/23/2015 0831   HGBUR NEGATIVE 11/23/2018 2220  BILIRUBINUR NEGATIVE 11/23/2018 2220   BILIRUBINUR neg 12/07/2015 0843   KETONESUR 20 (A) 11/23/2018 2220   PROTEINUR 100 (A) 11/23/2018 2220   UROBILINOGEN 1.0 12/07/2015 0843   UROBILINOGEN 0.2 11/23/2015 0831   NITRITE NEGATIVE 11/23/2018 2220   LEUKOCYTESUR NEGATIVE 11/23/2018 2220   Sepsis Labs: @LABRCNTIP (procalcitonin:4,lacticacidven:4)  ) Recent Results (from the past 240 hour(s))  SARS CORONAVIRUS 2 (TAT 6-24 HRS) Nasopharyngeal Nasopharyngeal Swab     Status: None   Collection Time: 11/24/18  5:55 AM   Specimen: Nasopharyngeal Swab  Result Value Ref Range Status   SARS Coronavirus 2 NEGATIVE NEGATIVE Final    Comment: (NOTE) SARS-CoV-2 target nucleic acids are NOT DETECTED. The SARS-CoV-2 RNA is generally detectable in upper and lower respiratory specimens during the acute phase of infection. Negative results do not preclude SARS-CoV-2 infection, do not rule out co-infections with other pathogens, and should not be used as the sole basis for treatment or other  patient management decisions. Negative results must be combined with clinical observations, patient history, and epidemiological information. The expected result is Negative. Fact Sheet for Patients: SugarRoll.be Fact Sheet for Healthcare Providers: https://www.woods-mathews.com/ This test is not yet approved or cleared by the [place] FDA and  has been authorized for detection and/or diagnosis of SARS-CoV-2 by FDA under an Emergency Use Authorization (EUA). This EUA will remain  in effect (meaning this test can be used) for the duration of the COVID-19 declaration under Section 56 4(b)(1) of the Act, 21 U.S.C. section 360bbb-3(b)(1), unless the authorization is terminated or revoked sooner. Performed at [facility], [place] [address], [place], [place] [address]   MRSA PCR Screening     Status: None   Collection Time: 11/24/18  7:04 AM   Specimen: Nasal Mucosa; Nasopharyngeal  Result Value Ref Range Status   MRSA by PCR NEGATIVE NEGATIVE Final    Comment:        The GeneXpert MRSA Assay (FDA approved for NASAL specimens only), is one component of a comprehensive MRSA colonization surveillance program. It is not intended to diagnose MRSA infection nor to guide or monitor treatment for MRSA infections. Performed at [facility], [place] [address], [place], [place] [address]   Culture, Urine     Status: None   Collection Time: 11/24/18  8:45 AM   Specimen: Urine, Clean Catch  Result Value Ref Range Status   Specimen Description URINE, CLEAN CATCH  Final   Special Requests NONE  Final   Culture   Final    NO GROWTH Performed at [facility], [place] [address], [place], [place] [address]    Report Status 11/25/2018 FINAL  Final  Culture, blood (routine x 2)     Status: None (Preliminary result)   Collection Time: 11/24/18 10:15 AM   Specimen: BLOOD LEFT ARM  Result Value Ref Range Status   Specimen Description BLOOD  LEFT ARM  Final   Special Requests   Final    BOTTLES DRAWN AEROBIC ONLY Blood Culture adequate volume   Culture   Final    NO GROWTH 1 DAY Performed at [facility], [place] [address] [place], [place] [address]    Report Status PENDING  Incomplete  Culture, blood (routine x 2)     Status: None (Preliminary result)   Collection Time: 11/24/18 10:27 AM   Specimen: BLOOD LEFT HAND  Result Value Ref Range Status   Specimen Description BLOOD LEFT HAND  Final   Special Requests  Final    BOTTLES DRAWN AEROBIC ONLY Blood Culture results may not be optimal due to an inadequate volume of blood received in culture bottles   Culture   Final    NO GROWTH 1 DAY Performed at [facility] [address] [place], [place] [address]    Report Status PENDING  Incomplete         Radiology Studies: Ct Angio Chest Pe W Or Wo Contrast  Result Date: 11/24/2018 CLINICAL DATA:  Shortness of breath.  Possible pulmonary embolus. EXAM: CT ANGIOGRAPHY CHEST WITH CONTRAST TECHNIQUE: Multidetector CT imaging of the chest was performed using the standard protocol during bolus administration of intravenous contrast. Multiplanar CT image reconstructions and MIPs were obtained to evaluate the vascular anatomy. CONTRAST:  93mL OMNIPAQUE IOHEXOL 350 MG/ML SOLN COMPARISON:  Chest x-ray November 24, 2018 FINDINGS: Cardiovascular: Coronary artery calcifications are identified. The patient is status post CABG. The thoracic aorta is nonaneurysmal with no dissection. No pulmonary emboli identified. Mediastinum/Nodes: Moderate bilateral pleural effusions are identified. No significant pericardial effusion identified. Increased attenuation in the pericardial fat is likely due to recent CABG. The esophagus and thyroid are normal. The chest wall including the breasts are unremarkable. No adenopathy identified. Lungs/Pleura: Central airways are normal. No pneumothorax. Moderate bilateral pleural effusions with atelectasis  are identified. No suspicious infiltrates are identified to suggest pneumonia. No pulmonary nodules or masses. Upper Abdomen: No acute abnormality. Musculoskeletal: No chest wall abnormality. No acute or significant osseous findings. Review of the MIP images confirms the above findings. IMPRESSION: 1. No pulmonary emboli identified. 2. Recent CABG.  Coronary artery calcifications. 3. Moderate bilateral pleural effusions with atelectasis. Electronically Signed   By: Dorise Bullion III M.D   On: 11/24/2018 14:27   Ct Abdomen Pelvis W Contrast  Result Date: 11/24/2018 CLINICAL DATA:  Mid to upper abdominal pain and vomiting today. EXAM: CT ABDOMEN AND PELVIS WITH CONTRAST TECHNIQUE: Multidetector CT imaging of the abdomen and pelvis was performed using the standard protocol following bolus administration of intravenous contrast. CONTRAST:  163mL OMNIPAQUE IOHEXOL 300 MG/ML  SOLN COMPARISON:  12/04/2017 FINDINGS: Lower chest: Moderate-sized right pleural effusion with compressive atelectasis of the right lower lobe. Small left pleural effusion. Hepatobiliary: No focal liver abnormality is seen. No gallstones, gallbladder wall thickening, or biliary dilatation. Pancreas: Unremarkable. No pancreatic ductal dilatation or surrounding inflammatory changes. Spleen: Normal in size without focal abnormality. Adrenals/Urinary Tract: Adrenal glands are unremarkable. Kidneys are normal, without renal calculi, focal lesion, or hydronephrosis. Bladder is distended, extending almost to the level of the umbilicus. Stomach/Bowel: Unremarkable stomach, small bowel and colon. Surgically absent appendix. Vascular/Lymphatic: No significant vascular findings are present. No enlarged abdominal or pelvic lymph nodes. Reproductive: Uterus and bilateral adnexa are unremarkable. Other: No abdominal wall hernia or abnormality. No abdominopelvic ascites. Musculoskeletal: Lumbar spine degenerative changes. IMPRESSION: 1. No acute abnormality.  2. Moderate-sized right pleural effusion with compressive atelectasis of the right lower lobe. 3. Small left pleural effusion. 4. Distended urinary bladder, similar to the previous examination. Electronically Signed   By: Claudie Revering M.D.   On: 11/24/2018 04:35   Dg Chest Port 1 View  Result Date: 11/24/2018 CLINICAL DATA:  Vomiting and epigastric pain. EXAM: PORTABLE CHEST 1 VIEW COMPARISON:  November 15, 2018 FINDINGS: No pneumothorax. The cardiomediastinal silhouette is stable. Minimal atelectasis in the left base/lingula is stable in the interval. No other acute interval changes. IMPRESSION: Mild atelectasis in the lingula or left lower lobe is stable. No other changes.  Electronically Signed   By: Dorise Bullion III M.D   On: 11/24/2018 10:35        Scheduled Meds:  aspirin EC  81 mg Oral Daily   atorvastatin  80 mg Oral q1800   clopidogrel  75 mg Oral Q breakfast   colchicine  0.6 mg Oral BID   FLUoxetine  40 mg Oral Daily   insulin pump   Subcutaneous TID WC, HS, 0200   ketorolac  7.5 mg Intravenous Q6H   levothyroxine  100 mcg Oral QAC breakfast   Continuous Infusions:  cefTRIAXone (ROCEPHIN)  IV 1 g (11/25/18 1003)     LOS: 0 days    Time spent: 25 minutes   Edwin Dada, MD [facility] 11/25/2018, 5:15 PM     Please page through [place]:  [email or web address] Password [email or web address] If 7PM-7AM, please contact night-coverage

## 2018-11-25 NOTE — Progress Notes (Signed)
  2D Echocardiogram has been performed.  Jennifer Hogan 11/25/2018, 9:36 AM

## 2018-11-25 NOTE — Plan of Care (Signed)

## 2018-11-25 NOTE — Progress Notes (Signed)
Inpatient Diabetes Program Recommendations  AACE/ADA: New Consensus Statement on Inpatient Glycemic Control (2015)  Target Ranges:  Prepandial:   less than 140 mg/dL      Peak postprandial:   less than 180 mg/dL (1-2 hours)      Critically ill patients:  140 - 180 mg/dL   Lab Results  Component Value Date   GLUCAP 141 (H) 11/25/2018   HGBA1C 10.2 (H) 11/02/2018    Review of Glycemic Control Results for NAIOVY, THUNE (MRN [id number]) as of 11/25/2018 11:36  Ref. Range 11/24/2018 15:00 11/24/2018 18:02 11/24/2018 23:15 11/25/2018 02:27 11/25/2018 06:12  Glucose Latest Ref Range: 70 - 99 mg/dL 334 (H) 181 (H) 226 (H) 152 (H) 160 (H)    Diabetes history: Type I Outpatient Diabetes medications: Novolog 35 units/day via Medronic insulin pump Current orders for Inpatient glycemic control: Lantus 10 unit QD; insulin infusion DC'd between 06-07am; Pump 35 units/day  Inpatient Diabetes Program Recommendations: Patient admitted with dehydration and vomiting.  Insulin infusion discontinued this morning between 06-07am.  Patient connected to insulin pump to existing site about 4 hours ago.  Recommended not starting back on pump yet as she had 10u of Lantus this morning at 5am.  She states she would like to leave it on and is checking her BS every hour with her meter as well as nursing staff checking every 4 hours.  She states her last BS was 150 with her meter.  Patient states she has decreased her basal rate from 0.825/hr to 0.425/hr since she had Lantus this morning.  Also recommended that the patient change her site as she came in with hyperglycemia.  She states "since everything is good now I don't want to change anything".  She says she will change the site after lunch time today.  She has her pump supplies and insulin at bedside.    Uses Medtronic pump with Guardian sensor-does not have sensor here in hospital.  Patient sees Dr. Loanne Drilling but would like to change to a different endocrinologist.   Provided a list of local providers.  Patient has had several admissions with DKA; recent CABG in October of this year.  A1C is 10.2.  Will continue to monitor blood sugars closely.    Thanks, Geoffry [place], RN, BSN Diabetes Coordinator [phone] (8a-5p)

## 2018-11-26 ENCOUNTER — Ambulatory Visit: Payer: Self-pay | Admitting: Physician Assistant

## 2018-11-26 DIAGNOSIS — R000 Tachycardia, unspecified: Secondary | ICD-10-CM

## 2018-11-26 DIAGNOSIS — R1013 Epigastric pain: Secondary | ICD-10-CM

## 2018-11-26 DIAGNOSIS — E1010 Type 1 diabetes mellitus with ketoacidosis without coma: Principal | ICD-10-CM

## 2018-11-26 DIAGNOSIS — R112 Nausea with vomiting, unspecified: Secondary | ICD-10-CM

## 2018-11-26 DIAGNOSIS — E039 Hypothyroidism, unspecified: Secondary | ICD-10-CM

## 2018-11-26 LAB — CBC
HCT: 32.9 % — ABNORMAL LOW (ref 36.0–46.0)
Hemoglobin: 10.4 g/dL — ABNORMAL LOW (ref 12.0–15.0)
MCH: 29.9 pg (ref 26.0–34.0)
MCHC: 31.6 g/dL (ref 30.0–36.0)
MCV: 94.5 fL (ref 80.0–100.0)
Platelets: 659 10*3/uL — ABNORMAL HIGH (ref 150–400)
RBC: 3.48 MIL/uL — ABNORMAL LOW (ref 3.87–5.11)
RDW: 15.6 % — ABNORMAL HIGH (ref 11.5–15.5)
WBC: 8.9 10*3/uL (ref 4.0–10.5)
nRBC: 0 % (ref 0.0–0.2)

## 2018-11-26 LAB — BASIC METABOLIC PANEL
Anion gap: 8 (ref 5–15)
BUN: 9 mg/dL (ref 6–20)
CO2: 21 mmol/L — ABNORMAL LOW (ref 22–32)
Calcium: 8.7 mg/dL — ABNORMAL LOW (ref 8.9–10.3)
Chloride: 110 mmol/L (ref 98–111)
Creatinine, Ser: 0.81 mg/dL (ref 0.44–1.00)
GFR calc Af Amer: >60 mL/min (ref >60)
GFR calc non Af Amer: >60 mL/min (ref >60)
Glucose, Bld: 89 mg/dL (ref 70–99)
Potassium: 4.7 mmol/L (ref 3.5–5.1)
Sodium: 139 mmol/L (ref 135–145)

## 2018-11-26 LAB — GLUCOSE, CAPILLARY
Glucose-Capillary: 98 mg/dL (ref 70–99)
Glucose-Capillary: 93 mg/dL (ref 70–99)

## 2018-11-26 MED ORDER — METOPROLOL TARTRATE 12.5 MG HALF TABLET
12.5000 mg | ORAL_TABLET | Freq: Two times a day (BID) | ORAL | Status: DC
  Administered 2018-11-26: 12.5 mg via ORAL
  Filled 2018-11-26: qty 1

## 2018-11-26 MED ORDER — IBUPROFEN 400 MG PO TABS: 800.0000 mg | ORAL_TABLET | Freq: Three times a day (TID) | ORAL | 0 refills | Status: DC

## 2018-11-26 MED ORDER — TRAZODONE HCL 50 MG PO TABS
50.0000 mg | ORAL_TABLET | Freq: Once | ORAL | Status: AC
  Administered 2018-11-26: 01:00:00 50 mg via ORAL
  Filled 2018-11-26: qty 1

## 2018-11-26 MED ORDER — COLCHICINE 0.6 MG PO TABS: 0.6000 mg | ORAL_TABLET | Freq: Two times a day (BID) | ORAL | 0 refills | Status: DC

## 2018-11-26 NOTE — TOC Initial Note (Signed)
Transition of Care [facility]) - Initial/Assessment Note    Patient Details  Name: Jennifer Hogan MRN: [id number] Date of Birth: Aug 20, 1970  Transition of [facility]) CM/SW Contact:    Zenon Mayo, RN Phone Number: 11/26/2018, 11:37 AM  Clinical Narrative:                 Ffrom home alone, for dc today, NCM assisted with Match Letter for medications. She has transportation and she is good with her insulin pump, she needs no help with the pump.  Expected Discharge Plan: Home/Self Care Barriers to Discharge: No Barriers Identified   Patient Goals and CMS Choice Patient states their goals for this hospitalization and ongoing recovery are:: go home   Choice offered to / list presented to : NA  Expected Discharge Plan and Services Expected Discharge Plan: Home/Self Care In-house Referral: NA Discharge Planning Services: CM Consult Post Acute Care Choice: NA Living arrangements for the past 2 months: Single Family Home Expected Discharge Date: 11/26/18               DME Arranged: (NA)         HH Arranged: NA          Prior Living Arrangements/Services Living arrangements for the past 2 months: Single Family Home Lives with:: Self Patient language and need for interpreter reviewed:: Yes Do you feel safe going back to the place where you live?: Yes      Need for Family Participation in Patient Care: No (Comment) Care giver support system in place?: No (comment)   Criminal Activity/Legal Involvement Pertinent to Current Situation/Hospitalization: No - Comment as needed  Activities of Daily Living Home Assistive Devices/Equipment: Insulin Pump ADL Screening (condition at time of admission) Patient's cognitive ability adequate to safely complete daily activities?: Yes Is the patient deaf or have difficulty hearing?: No Does the patient have difficulty seeing, even when wearing glasses/contacts?: No Does the patient have difficulty concentrating, remembering, or  making decisions?: Yes Patient able to express need for assistance with ADLs?: Yes Does the patient have difficulty dressing or bathing?: No Independently performs ADLs?: Yes (appropriate for developmental age) Does the patient have difficulty walking or climbing stairs?: No Weakness of Legs: None Weakness of Arms/Hands: None  Permission Sought/Granted                  Emotional Assessment Appearance:: Appears stated age Attitude/Demeanor/Rapport: Engaged Affect (typically observed): Appropriate Orientation: : Oriented to Self, Oriented to Place, Oriented to  Time, Oriented to Situation Alcohol / Substance Use: Not Applicable Psych Involvement: No (comment)  Admission diagnosis:  Dehydration [E86.0] Epigastric pain [R10.13] Hyperglycemia [R73.9] Intractable nausea and vomiting [R11.2] Patient Active Problem List   Diagnosis Date Noted  . DKA (diabetic ketoacidoses) ([place]) 11/25/2018  . Sinus tachycardia 11/24/2018  . Chronic systolic heart failure ([place])   . Cardiomyopathy ([place])   . Hypotension 10/29/2018  . DKA, type 1 ([place]) 05/24/2018  . Moderate episode of recurrent major depressive disorder ([place]) 03/27/2018  . Epigastric pain   . Hyperglycemia due to type 1 diabetes mellitus ([place]) 02/14/2018  . Hyperlipidemia LDL goal <70 02/14/2018  . S/P drug eluting coronary stent placement   . ACS (acute coronary syndrome) ([place]) 02/08/2018  . ST elevation myocardial infarction (STEMI) ([place]) 02/08/2018  . DKA (diabetic ketoacidosis) ([place]) 12/31/2017  . Marijuana abuse 12/31/2017  . Gastroparesis 12/06/2017  . Gastroparesis due to DM ([place]) 03/28/2017  . Coronary artery disease 03/28/2017  . Ischemic cardiomyopathy 03/28/2017  .  Pure hypercholesterolemia   . NSTEMI (non-ST elevated myocardial infarction) ([place])   . Anemia 03/17/2017  . Allergic state 08/07/2015  . Vitiligo 07/09/2012  . Type 1 diabetes mellitus ([place]) 07/09/2012  . Depression 12/04/2010  . Non-intractable  vomiting with nausea 12/04/2010  . Hypothyroidism 12/04/2010   PCP:  [name], NP Pharmacy:   [facility] [address] ([address]), [place] - [id number] [place] ([place]) [place] [address] Phone: [phone] Fax: [phone]     Social Determinants of Health (SDOH) Interventions    Readmission Risk Interventions Readmission Risk Prevention Plan 11/26/2018 11/09/2018 11/06/2018  Transportation Screening Complete - Complete  PCP or Specialist Appt within 3-5 Days Complete - Not Complete  Not Complete comments - - Continued medical workup  HRI or Home Care Consult Complete - Complete  Social Work Consult for Recovery Care Planning/Counseling Complete - Complete  Palliative Care Screening Complete - Not Applicable  Medication Review Press photographer) Complete - Complete  PCP or Specialist appointment within 3-5 days of discharge - Complete -  [place] or [place] - Complete -  SW Recovery Care/Counseling Consult - Complete -  [place] - Not Applicable -  Some recent data might be hidden

## 2018-11-26 NOTE — Plan of Care (Signed)
  Problem: Coping: Goal: Level of anxiety will decrease 11/26/2018 0731 by Shanon Ace, RN Outcome: Progressing 11/26/2018 0731 by Shanon Ace, RN Outcome: Progressing 11/25/2018 1938 by Shanon Ace, RN Outcome: Progressing   Problem: Elimination: Goal: Will not experience complications related to bowel motility 11/26/2018 0731 by Shanon Ace, RN Outcome: Progressing 11/26/2018 0731 by Shanon Ace, RN Outcome: Progressing 11/25/2018 1938 by Shanon Ace, RN Outcome: Progressing Goal: Will not experience complications related to urinary retention 11/26/2018 0731 by Shanon Ace, RN Outcome: Progressing 11/26/2018 0731 by Shanon Ace, RN Outcome: Progressing 11/25/2018 1938 by Shanon Ace, RN Outcome: Progressing   Problem: Pain Managment: Goal: General experience of comfort will improve 11/26/2018 0731 by Shanon Ace, RN Outcome: Progressing 11/26/2018 0731 by Shanon Ace, RN Outcome: Progressing 11/25/2018 1938 by Shanon Ace, RN Outcome: Progressing   Problem: Coping: Goal: Level of anxiety will decrease 11/26/2018 0731 by Shanon Ace, RN Outcome: Progressing 11/26/2018 0731 by Shanon Ace, RN Outcome: Progressing 11/25/2018 1938 by Shanon Ace, RN Outcome: Progressing   Problem: Safety: Goal: Ability to remain free from injury will improve 11/26/2018 0731 by Shanon Ace, RN Outcome: Progressing 11/26/2018 0731 by Shanon Ace, RN Outcome: Progressing 11/25/2018 1938 by Shanon Ace, RN Outcome: Progressing   Problem: Skin Integrity: Goal: Risk for impaired skin integrity will decrease 11/26/2018 0731 by Shanon Ace, RN Outcome: Progressing 11/26/2018 0731 by Shanon Ace, RN Outcome: Progressing 11/25/2018 1938 by Shanon Ace, RN Outcome: Progressing

## 2018-11-26 NOTE — Progress Notes (Signed)
Pt walking in hall

## 2018-11-26 NOTE — Progress Notes (Signed)
Inpatient Diabetes Program Recommendations  AACE/ADA: New Consensus Statement on Inpatient Glycemic Control (2015)  Target Ranges:  Prepandial:   less than 140 mg/dL      Peak postprandial:   less than 180 mg/dL (1-2 hours)      Critically ill patients:  140 - 180 mg/dL   Lab Results  Component Value Date   GLUCAP 98 11/26/2018   HGBA1C 10.2 (H) 11/02/2018    Review of Glycemic Control Results for Jennifer Hogan, Jennifer Hogan (MRN [id number]) as of 11/26/2018 09:52  Ref. Range 11/25/2018 20:26 11/25/2018 22:36 11/25/2018 23:26 11/26/2018 02:06 11/26/2018 08:33  Glucose-Capillary Latest Ref Range: 70 - 99 mg/dL 96 61 (L) 128 (H)  98   Diabetes history: Type 1 Outpatient Diabetes medications: Novolog 35 units every day via [employer] insulin pump Current orders for Inpatient glycemic control: Insulin pump 3 times daily with meals, HS and 0200  Inpatient Diabetes Program Recommendations:   Spoke with patient at bedside this morning.  She is feeling much better today.  She states she increased her basal to 0.525 units per hour last evening around 8pm.   Site has been in approximately 5 days.  Encouraged and educated importance of site changes every 3-4 days for best insulin absorption and decrease risk of scar tissue.  Patient agreed and changed site; provided patient with a new battery for her pump.  States she changes her site every 3-4 days at home.  Placed case manager consult order as patient does not have insurance and pays out of pocket for insulin and pump supplies.    Thanks, Geoffry Paradise, RN, BSN Diabetes Coordinator [phone] (8a-5p)

## 2018-11-26 NOTE — Discharge Summary (Signed)
Physician Discharge Summary  Jennifer Hogan [id number] DOB: [birth year] DOA: 11/23/2018  PCP: [name], NP  Admit date: 11/23/2018 Discharge date: 11/26/2018  Admitted From: Home Disposition: Home  Recommendations for Outpatient Follow-up:  1. Follow up with PCP in 1-2 weeks 2. Please obtain BMP/CBC in one week 3. Follow-up 11/16 with Dr. Orvan Seen (CT surgery) as previously scheduled   Discharge Condition: Stable CODE STATUS: Full code Diet recommendation: Heart healthy, carb modified  Brief/Interim Summary: 48 year old female with a history of diabetes on insulin pump, possible gastroparesis, coronary artery disease status post CABG on 11/02/2018 who presented to the emergency room with poor p.o. intake, vomiting and epigastric discomfort.  She was noted to be dehydrated, tachycardic.  CT abdomen pelvis was normal.  She was given IV fluids and antiemetics and admitted for observation.  Discharge Diagnoses:  Principal Problem:   Sinus tachycardia Active Problems:   Non-intractable vomiting with nausea   Hypothyroidism   Type 1 diabetes mellitus (HCC)   Coronary artery disease   Hyperglycemia due to type 1 diabetes mellitus ([place])   DKA (diabetic ketoacidoses) ([place])  1. Type I diabetic ketoacidosis.  Patient developed worsening ketoacidosis and a widening anion gap shortly after admission.  Subcutaneous insulin was stopped and she was started on insulin infusion.  She was aggressively hydrated with IV fluids.  Her gap closed overnight in the hospital and she was restarted on her insulin pump.  Blood sugars have since been stable. 2. SIRS.  Secondary to DKA.  Initially, there was concern for underlying infection she was started on broad-spectrum antibiotics.  Blood cultures as well as urine culture did not show any growth.  She did not have a fever.  Antibiotics were discontinued. 3. Post CABG pericarditis and pleuritis/pleural effusion.  Echocardiogram did not indicate  any pericardial effusion.  She was noted to have small to moderate-sized bilateral pleural effusions.  Her CT surgeon, Dr. Orvan Seen was contacted.  Patient reported that Dr. Orvan Seen did come to evaluate her and started the patient on colchicine as well as Toradol.  Patient feels substantially better with this and her chest pain resolved.  She will be discharged on colchicine and NSAIDs.  She also has Protonix at home while using NSAIDs.  She will follow-up with CT surgery on 11/16  The remainder of medical problems remained stable.  Discharge Instructions  Discharge Instructions    Diet - low sodium heart healthy   Complete by: As directed    Increase activity slowly   Complete by: As directed      Allergies as of 11/26/2018      Reactions   Crestor [rosuvastatin Calcium] Other (See Comments)   Severe myalgias and joint pain. Has tolerated atorvastatin though   Peanut-containing Drug Products Other (See Comments)   Vomiting, upset stomach and some wheezing   Ciprofloxin Hcl [ciprofloxacin] Nausea And Vomiting, Other (See Comments)   Severe migraine   Food Color Red [red Dye] Diarrhea      Medication List    STOP taking these medications   insulin lispro 100 UNIT/ML injection Commonly known as: HumaLOG   sulfamethoxazole-trimethoprim 800-160 MG tablet Commonly known as: Bactrim DS     TAKE these medications   acetaminophen 325 MG tablet Commonly known as: TYLENOL Take 650 mg by mouth every 6 (six) hours as needed for mild pain or moderate pain.   albuterol 108 (90 Base) MCG/ACT inhaler Commonly known as: VENTOLIN HFA Inhale 2 puffs into the lungs every 6 (six) hours  as needed for wheezing or shortness of breath.   aspirin 81 MG EC tablet Take 1 tablet (81 mg total) by mouth daily.   atorvastatin 80 MG tablet Commonly known as: LIPITOR Take 1 tablet (80 mg total) by mouth daily at 6 PM.   clopidogrel 75 MG tablet Commonly known as: PLAVIX Take 1 tablet (75 mg total) by  mouth daily with breakfast. Please keep upcoming appt in November for future refills. Thank you   colchicine 0.6 MG tablet Take 1 tablet (0.6 mg total) by mouth 2 (two) times daily. What changed: how much to take   FLUoxetine 40 MG capsule Commonly known as: PROzac Take 1 capsule (40 mg total) by mouth daily. What changed: Another medication with the same name was removed. Continue taking this medication, and follow the directions you see here.   furosemide 20 MG tablet Commonly known as: LASIX Take 1 tablet (20 mg total) by mouth daily.   ibuprofen 400 MG tablet Commonly known as: ADVIL Take 2 tablets (800 mg total) by mouth 3 (three) times daily.   insulin aspart 100 UNIT/ML injection Commonly known as: novoLOG 35 units per insulin pump daily What changed:   how much to take  how to take this  when to take this  additional instructions   ipratropium 0.06 % nasal spray Commonly known as: Atrovent Place 2 sprays into both nostrils 4 (four) times daily. What changed: when to take this   isosorbide dinitrate 5 MG tablet Commonly known as: ISORDIL Take 1 tablet (5 mg total) by mouth 3 (three) times daily.   levothyroxine 100 MCG tablet Commonly known as: Euthyrox Take 1 tablet (100 mcg total) by mouth daily before breakfast. Must have office visit for refills   metoprolol tartrate 25 MG tablet Commonly known as: LOPRESSOR Take 0.5 tablets (12.5 mg total) by mouth 2 (two) times daily. Hold for SBP<90 and HR<50   montelukast 10 MG tablet Commonly known as: SINGULAIR TAKE 1 TABLET BY MOUTH AT BEDTIME   nitroGLYCERIN 0.4 MG SL tablet Commonly known as: NITROSTAT Place 1 tablet (0.4 mg total) under the tongue every 5 (five) minutes as needed for chest pain.   pantoprazole 40 MG tablet Commonly known as: Protonix Take 1 tablet (40 mg total) by mouth 2 (two) times daily. What changed: Another medication with the same name was removed. Continue taking this medication,  and follow the directions you see here.   sucralfate 1 GM/10ML suspension Commonly known as: Carafate Take 10 mLs (1 g total) by mouth 2 (two) times daily for 30 days. What changed:   when to take this  reasons to take this   traMADol 50 MG tablet Commonly known as: ULTRAM Take 1-2 tablets (50-100 mg total) by mouth every 4 (four) hours as needed for moderate pain.      Follow-up Information    Wonda Olds, MD Follow up on 12/09/2018.   Specialty: Cardiothoracic Surgery Why: as scheduled Contact information: [address] [place] [address] [place] Follow up on 12/27/2018.   Why: 9:30 for hospital follow up Contact information: [place] [phone]         Allergies  Allergen Reactions  . Crestor [Rosuvastatin Calcium] Other (See Comments)    Severe myalgias and joint pain. Has tolerated atorvastatin though  . Peanut-Containing Drug Products Other (See Comments)    Vomiting,  upset stomach and some wheezing  . Ciprofloxin Hcl [Ciprofloxacin] Nausea And Vomiting and Other (See Comments)    Severe migraine  . Food Color Red [Red Dye] Diarrhea    Consultations:  CT surgery   Procedures/Studies: Dg Chest 2 View  Result Date: 11/15/2018 CLINICAL DATA:  Patient status post CABG 11/04/2018. EXAM: CHEST - 2 VIEW COMPARISON:  PA and lateral chest 11/08/2018 and 11/03/2018. FINDINGS: Median sternotomy wires are unchanged. Lungs are clear. No pneumothorax. Tiny bilateral pleural effusions are present. No acute or focal bony abnormality. IMPRESSION: Tiny bilateral pleural effusions.  Otherwise negative. Electronically Signed   By: Inge Rise M.D.   On: 11/15/2018 12:25   Dg Chest 2 View  Result Date: 11/08/2018 CLINICAL DATA:  Status post cardiac surgery EXAM: CHEST - 2 VIEW COMPARISON:  11/06/2018 FINDINGS: Interval removal of a  left-sided chest tube. No significant pneumothorax. Improved aeration of the lungs and near complete resolution of a trace left pleural effusion. No acute appearing airspace opacity. The heart and mediastinum are normal in size status post median sternotomy. Disc degenerative disease of the thoracic spine IMPRESSION: 1. Interval removal of a left-sided chest tube. No significant pneumothorax. 2. Improved aeration of the lungs and near complete resolution of a trace left pleural effusion. No acute appearing airspace opacity. Electronically Signed   By: Eddie Candle M.D.   On: 11/08/2018 09:30   Dg Chest 2 View  Result Date: 11/03/2018 CLINICAL DATA:  Pre-surgical assessment for CABG EXAM: CHEST - 2 VIEW COMPARISON:  10/29/2018 FINDINGS: The heart size and mediastinal contours are within normal limits. Both lungs are clear. Mild degenerative changes of the midthoracic spine. IMPRESSION: No active cardiopulmonary disease. Electronically Signed   By: Donavan Foil M.D.   On: 11/03/2018 20:55   Ct Angio Chest Pe W Or Wo Contrast  Result Date: 11/24/2018 CLINICAL DATA:  Shortness of breath.  Possible pulmonary embolus. EXAM: CT ANGIOGRAPHY CHEST WITH CONTRAST TECHNIQUE: Multidetector CT imaging of the chest was performed using the standard protocol during bolus administration of intravenous contrast. Multiplanar CT image reconstructions and MIPs were obtained to evaluate the vascular anatomy. CONTRAST:  75mL OMNIPAQUE IOHEXOL 350 MG/ML SOLN COMPARISON:  Chest x-ray November 24, 2018 FINDINGS: Cardiovascular: Coronary artery calcifications are identified. The patient is status post CABG. The thoracic aorta is nonaneurysmal with no dissection. No pulmonary emboli identified. Mediastinum/Nodes: Moderate bilateral pleural effusions are identified. No significant pericardial effusion identified. Increased attenuation in the pericardial fat is likely due to recent CABG. The esophagus and thyroid are normal. The chest  wall including the breasts are unremarkable. No adenopathy identified. Lungs/Pleura: Central airways are normal. No pneumothorax. Moderate bilateral pleural effusions with atelectasis are identified. No suspicious infiltrates are identified to suggest pneumonia. No pulmonary nodules or masses. Upper Abdomen: No acute abnormality. Musculoskeletal: No chest wall abnormality. No acute or significant osseous findings. Review of the MIP images confirms the above findings. IMPRESSION: 1. No pulmonary emboli identified. 2. Recent CABG.  Coronary artery calcifications. 3. Moderate bilateral pleural effusions with atelectasis. Electronically Signed   By: Dorise Bullion III M.D   On: 11/24/2018 14:27   Ct Abdomen Pelvis W Contrast  Result Date: 11/24/2018 CLINICAL DATA:  Mid to upper abdominal pain and vomiting today. EXAM: CT ABDOMEN AND PELVIS WITH CONTRAST TECHNIQUE: Multidetector CT imaging of the abdomen and pelvis was performed using the standard protocol following bolus administration of intravenous contrast. CONTRAST:  169mL OMNIPAQUE IOHEXOL 300 MG/ML  SOLN COMPARISON:  12/04/2017 FINDINGS: Lower  chest: Moderate-sized right pleural effusion with compressive atelectasis of the right lower lobe. Small left pleural effusion. Hepatobiliary: No focal liver abnormality is seen. No gallstones, gallbladder wall thickening, or biliary dilatation. Pancreas: Unremarkable. No pancreatic ductal dilatation or surrounding inflammatory changes. Spleen: Normal in size without focal abnormality. Adrenals/Urinary Tract: Adrenal glands are unremarkable. Kidneys are normal, without renal calculi, focal lesion, or hydronephrosis. Bladder is distended, extending almost to the level of the umbilicus. Stomach/Bowel: Unremarkable stomach, small bowel and colon. Surgically absent appendix. Vascular/Lymphatic: No significant vascular findings are present. No enlarged abdominal or pelvic lymph nodes. Reproductive: Uterus and bilateral  adnexa are unremarkable. Other: No abdominal wall hernia or abnormality. No abdominopelvic ascites. Musculoskeletal: Lumbar spine degenerative changes. IMPRESSION: 1. No acute abnormality. 2. Moderate-sized right pleural effusion with compressive atelectasis of the right lower lobe. 3. Small left pleural effusion. 4. Distended urinary bladder, similar to the previous examination. Electronically Signed   By: Claudie Revering M.D.   On: 11/24/2018 04:35   Dg Chest Port 1 View  Result Date: 11/24/2018 CLINICAL DATA:  Vomiting and epigastric pain. EXAM: PORTABLE CHEST 1 VIEW COMPARISON:  November 15, 2018 FINDINGS: No pneumothorax. The cardiomediastinal silhouette is stable. Minimal atelectasis in the left base/lingula is stable in the interval. No other acute interval changes. IMPRESSION: Mild atelectasis in the lingula or left lower lobe is stable. No other changes. Electronically Signed   By: Dorise Bullion III M.D   On: 11/24/2018 10:35   Dg Chest Port 1 View  Result Date: 11/06/2018 CLINICAL DATA:  Status post cardiac surgery. EXAM: PORTABLE CHEST 1 VIEW COMPARISON:  November 05, 2018. FINDINGS: Stable cardiomegaly. Bilateral chest tubes are noted without pneumothorax. Right internal jugular Swan-Ganz catheter has been removed. No pneumothorax or significant pleural effusion is noted. Right lung is clear. Mild bibasilar subsegmental atelectasis is noted. Bony thorax is unremarkable. IMPRESSION: Stable bilateral chest tubes without pneumothorax. Mild bibasilar subsegmental atelectasis. Electronically Signed   By: Marijo Conception M.D.   On: 11/06/2018 07:26   Dg Chest Port 1 View  Result Date: 11/05/2018 CLINICAL DATA:  Chest tube, CABG EXAM: PORTABLE CHEST 1 VIEW COMPARISON:  11/04/2018 FINDINGS: Interval removal of endotracheal tube. Swan-Ganz catheter and left chest tube remain in sclerotic that Swan-Ganz catheter and bilateral chest tubes remain in place, unchanged. No pneumothorax. Bibasilar atelectasis  and suspected small layering effusions. Mild cardiomegaly. Left upper lobe perihilar atelectasis, stable. IMPRESSION: Interval extubation. Bilateral chest tubes remain in stable position. No pneumothorax. Areas of atelectasis bilaterally.  Suspect small layering effusions. Electronically Signed   By: Rolm Baptise M.D.   On: 11/05/2018 08:23   Dg Chest Port 1 View  Result Date: 11/04/2018 CLINICAL DATA:  Status post CABG EXAM: PORTABLE CHEST 1 VIEW COMPARISON:  November 03, 2018 FINDINGS: The ETT terminates 12 mm above the carina. Recommend withdrawing 1.5 cm. The PA catheter is in good position. Chest tubes and mediastinal drain are stable. There is a tiny left apical pneumothorax. Opacity in the left base is likely a small effusion and atelectasis. Opacity adjacent to the distal left chest tube may represent atelectasis as well. No other acute abnormalities. IMPRESSION: 1. The ETT terminates 1.2 cm above the carina. Recommend withdrawing 1.5 cm. 2. Other support apparatus as above. There is a small left apical pneumothorax with a left chest tube in place. 3. Opacity in left base is likely atelectasis. There is a small associated effusion. 4. No other acute abnormalities. Electronically Signed   By: Dorise Bullion  III M.D   On: 11/04/2018 14:08   Dg Chest Port 1 View  Result Date: 10/29/2018 CLINICAL DATA:  Central chest pain for several days EXAM: PORTABLE CHEST 1 VIEW COMPARISON:  05/24/2018 FINDINGS: The heart size and mediastinal contours are within normal limits. Both lungs are clear. The visualized skeletal structures are unremarkable. IMPRESSION: No active disease. Electronically Signed   By: Inez Catalina M.D.   On: 10/29/2018 11:56   Vas US Doppler Pre Cabg  Result Date: 10/31/2018 PREOPERATIVE VASCULAR EVALUATION  Indications:      Pre CABG. Risk Factors:     Hyperlipidemia, Diabetes, coronary artery disease. Comparison Study: No prior study Performing Technologist: Windber, RVT   Examination Guidelines: A complete evaluation includes B-mode imaging, spectral Doppler, color Doppler, and power Doppler as needed of all accessible portions of each vessel. Bilateral testing is considered an integral part of a complete examination. Limited examinations for reoccurring indications may be performed as noted.  Right Carotid Findings: +----------+--------+--------+--------+--------+--------+           PSV cm/sEDV cm/sStenosisDescribeComments +----------+--------+--------+--------+--------+--------+ CCA Prox  98      22                               +----------+--------+--------+--------+--------+--------+ CCA Distal83      29                               +----------+--------+--------+--------+--------+--------+ ICA Prox  72      29                               +----------+--------+--------+--------+--------+--------+ ICA Distal76      27                               +----------+--------+--------+--------+--------+--------+ ECA       97      16                               +----------+--------+--------+--------+--------+--------+ Portions of this table do not appear on this page. +----------+--------+-------+----------------+------------+           PSV cm/sEDV cmsDescribe        Arm Pressure +----------+--------+-------+----------------+------------+ Subclavian119            Multiphasic, [id number]           +----------+--------+-------+----------------+------------+ +---------+--------+--+--------+--+---------+ VertebralPSV cm/s48EDV cm/s19Antegrade +---------+--------+--+--------+--+---------+ Left Carotid Findings: +----------+--------+--------+--------+--------+--------+           PSV cm/sEDV cm/sStenosisDescribeComments +----------+--------+--------+--------+--------+--------+ CCA Prox  107     22                               +----------+--------+--------+--------+--------+--------+ CCA Distal66      20                                +----------+--------+--------+--------+--------+--------+ ICA Prox  90      34                               +----------+--------+--------+--------+--------+--------+ ICA Distal101     47                               +----------+--------+--------+--------+--------+--------+  ECA       108     14                               +----------+--------+--------+--------+--------+--------+ +----------+--------+--------+----------------+------------+ SubclavianPSV cm/sEDV cm/sDescribe        Arm Pressure +----------+--------+--------+----------------+------------+           182             Multiphasic, [id number]          +----------+--------+--------+----------------+------------+ +---------+--------+--+--------+--+---------+ VertebralPSV cm/s48EDV cm/s17Antegrade +---------+--------+--+--------+--+---------+  ABI Findings: +--------+------------------+-----+---------+--------+ Right   Rt Pressure (mmHg)IndexWaveform Comment  +--------+------------------+-----+---------+--------+ [id number]                     triphasic         +--------+------------------+-----+---------+--------+ PTA                            triphasic         +--------+------------------+-----+---------+--------+ DP                             triphasic         +--------+------------------+-----+---------+--------+ +--------+------------------+-----+---------+-------+ Left    Lt Pressure (mmHg)IndexWaveform Comment +--------+------------------+-----+---------+-------+ Brachial100                    triphasic        +--------+------------------+-----+---------+-------+ PTA                            triphasic        +--------+------------------+-----+---------+-------+ DP                             triphasic        +--------+------------------+-----+---------+-------+  Right Doppler Findings:  +-----------+--------+-----+---------+-----------------------------------------+ Site       PressureIndexDoppler  Comments                                  +-----------+--------+-----+---------+-----------------------------------------+ Brachial   92           triphasic                                          +-----------+--------+-----+---------+-----------------------------------------+ Radial                  triphasic                                          +-----------+--------+-----+---------+-----------------------------------------+ Ulnar                   triphasic                                          +-----------+--------+-----+---------+-----------------------------------------+ Palmar Arch                      Signal decreases >50% with radial  compression, obliterates with ulnar                                        compression.                              +-----------+--------+-----+---------+-----------------------------------------+  Left Doppler Findings: +-----------+--------+-----+---------+--------------------+ Site       PressureIndexDoppler  Comments             +-----------+--------+-----+---------+--------------------+ Brachial   100          triphasic                     +-----------+--------+-----+---------+--------------------+ Radial                  triphasic                     +-----------+--------+-----+---------+--------------------+ Ulnar                   triphasic                     +-----------+--------+-----+---------+--------------------+ Palmar Arch                      Within normal limits +-----------+--------+-----+---------+--------------------+  Summary: Right Carotid: Velocities in the right ICA are consistent with a 1-39% stenosis. Left Carotid: Velocities in the left ICA are consistent with a 1-39% stenosis. Vertebrals:  Bilateral vertebral arteries  demonstrate antegrade flow. Subclavians: Normal flow hemodynamics were seen in bilateral subclavian              arteries.  Electronically signed by Monica Martinez MD on 10/31/2018 at 5:12:12 PM.    Final        Subjective: Patient is feeling significantly better than yesterday.  No further chest pain.  No shortness of breath.  Discharge Exam: Vitals:   11/26/18 0939 11/26/18 1040 11/26/18 1050 11/26/18 1106  BP: 108/74   114/77  Pulse: 100     Resp:  (!) 35 (!) 25 14  Temp:    97.8 F (36.6 C)  TempSrc:    Oral  SpO2:      Height:        General: Pt is alert, awake, not in acute distress Cardiovascular: Mild tachycardia, S1/S2 +, no rubs, no gallops Respiratory: CTA bilaterally, no wheezing, no rhonchi Abdominal: Soft, NT, ND, bowel sounds + Extremities: no edema, no cyanosis    The results of significant diagnostics from this hospitalization (including imaging, microbiology, ancillary and laboratory) are listed below for reference.     Microbiology: Recent Results (from the past 240 hour(s))  SARS CORONAVIRUS 2 (TAT 6-24 HRS) Nasopharyngeal Nasopharyngeal Swab     Status: None   Collection Time: 11/24/18  5:55 AM   Specimen: Nasopharyngeal Swab  Result Value Ref Range Status   SARS Coronavirus 2 NEGATIVE NEGATIVE Final    Comment: (NOTE) SARS-CoV-2 target nucleic acids are NOT DETECTED. The SARS-CoV-2 RNA is generally detectable in upper and lower respiratory specimens during the acute phase of infection. Negative results do not preclude SARS-CoV-2 infection, do not rule out co-infections with other pathogens, and should not be used as the sole basis for treatment or other patient management decisions. Negative results must be combined with clinical observations, patient history, and epidemiological information. The expected  result is Negative. Fact Sheet for Patients: SugarRoll.be Fact Sheet for Healthcare  Providers: https://www.woods-mathews.com/ This test is not yet approved or cleared by the [place] FDA and  has been authorized for detection and/or diagnosis of SARS-CoV-2 by FDA under an Emergency Use Authorization (EUA). This EUA will remain  in effect (meaning this test can be used) for the duration of the COVID-19 declaration under Section 56 4(b)(1) of the Act, 21 U.S.C. section 360bbb-3(b)(1), unless the authorization is terminated or revoked sooner. Performed at [facility], [place] [address], [place], [place] [address]   MRSA PCR Screening     Status: None   Collection Time: 11/24/18  7:04 AM   Specimen: Nasal Mucosa; Nasopharyngeal  Result Value Ref Range Status   MRSA by PCR NEGATIVE NEGATIVE Final    Comment:        The GeneXpert MRSA Assay (FDA approved for NASAL specimens only), is one component of a comprehensive MRSA colonization surveillance program. It is not intended to diagnose MRSA infection nor to guide or monitor treatment for MRSA infections. Performed at [facility], [place] [address] [place], [place] [address]   Culture, Urine     Status: None   Collection Time: 11/24/18  8:45 AM   Specimen: Urine, Clean Catch  Result Value Ref Range Status   Specimen Description URINE, CLEAN CATCH  Final   Special Requests NONE  Final   Culture   Final    NO GROWTH Performed at [facility], [place] [address] [place], [place] [address]    Report Status 11/25/2018 FINAL  Final  Culture, blood (routine x 2)     Status: None (Preliminary result)   Collection Time: 11/24/18 10:15 AM   Specimen: BLOOD LEFT ARM  Result Value Ref Range Status   Specimen Description BLOOD LEFT ARM  Final   Special Requests   Final    BOTTLES DRAWN AEROBIC ONLY Blood Culture adequate volume   Culture   Final    NO GROWTH 2 DAYS Performed at [facility], [place] [address], [place], [place] [address]    Report Status PENDING  Incomplete  Culture,  blood (routine x 2)     Status: None (Preliminary result)   Collection Time: 11/24/18 10:27 AM   Specimen: BLOOD LEFT HAND  Result Value Ref Range Status   Specimen Description BLOOD LEFT HAND  Final   Special Requests   Final    BOTTLES DRAWN AEROBIC ONLY Blood Culture results may not be optimal due to an inadequate volume of blood received in culture bottles   Culture   Final    NO GROWTH 2 DAYS Performed at [facility], [place] [address] [place], [place] [address]    Report Status PENDING  Incomplete     Labs: BNP (last 3 results) No results for input(s): BNP in the last 8760 hours. Basic Metabolic Panel: Recent Labs  Lab 11/24/18 1802 11/24/18 2315 11/25/18 0227 11/25/18 0612 11/26/18 0206  NA 137 136 137 136 139  K 4.2 4.6 3.7 4.0 4.7  CL 109 112* 112* 109 110  CO2 15* 14* 17* 15* 21*  GLUCOSE 181* 226* 152* 160* 89  BUN 10 11 10 8 9   CREATININE 1.00 0.76 0.68 0.78 0.81  CALCIUM 8.8* 8.2* 8.4* 8.2* 8.7*   Liver Function Tests: Recent Labs  Lab 11/23/18 2223 11/25/18 0612  AST 15 15  ALT 12 10  ALKPHOS 94 77  BILITOT  0.8 0.6  PROT 7.0 6.2*  ALBUMIN 3.2* 2.6*   Recent Labs  Lab 11/23/18 2223  LIPASE 12   No results for input(s): AMMONIA in the last 168 hours. CBC: Recent Labs  Lab 11/23/18 2223 11/24/18 1030 11/25/18 0612 11/26/18 0206  WBC 8.1 9.2 10.1 8.9  HGB 10.8* 10.6* 10.0* 10.4*  HCT 32.5* 32.9* 31.4* 32.9*  MCV 91.0 93.2 94.0 94.5  PLT 733* 725* 669* 659*   Cardiac Enzymes: No results for input(s): CKTOTAL, CKMB, CKMBINDEX, TROPONINI in the last 168 hours. BNP: Invalid input(s): POCBNP CBG: Recent Labs  Lab 11/25/18 2026 11/25/18 2236 11/25/18 2326 11/26/18 0833 11/26/18 1130  GLUCAP 96 61* 128* 98 93   D-Dimer Recent Labs    11/24/18 1041  DDIMER >20.00*   Hgb A1c No results for input(s): HGBA1C in the last 72 hours. Lipid Profile No results for input(s): CHOL, HDL, LDLCALC, TRIG, CHOLHDL, LDLDIRECT in the last  72 hours. Thyroid function studies No results for input(s): TSH, T4TOTAL, T3FREE, THYROIDAB in the last 72 hours.  Invalid input(s): FREET3 Anemia work up No results for input(s): VITAMINB12, FOLATE, FERRITIN, TIBC, IRON, RETICCTPCT in the last 72 hours. Urinalysis    Component Value Date/Time   COLORURINE AMBER (A) 11/23/2018 2220   APPEARANCEUR CLOUDY (A) 11/23/2018 2220   LABSPEC 1.025 11/23/2018 2220   PHURINE 6.0 11/23/2018 2220   GLUCOSEU 150 (A) 11/23/2018 2220   GLUCOSEU NEGATIVE 11/23/2015 0831   HGBUR NEGATIVE 11/23/2018 2220   BILIRUBINUR NEGATIVE 11/23/2018 2220   BILIRUBINUR neg 12/07/2015 0843   KETONESUR 20 (A) 11/23/2018 2220   PROTEINUR 100 (A) 11/23/2018 2220   UROBILINOGEN 1.0 12/07/2015 0843   UROBILINOGEN 0.2 11/23/2015 0831   NITRITE NEGATIVE 11/23/2018 2220   LEUKOCYTESUR NEGATIVE 11/23/2018 2220   Sepsis Labs Invalid input(s): PROCALCITONIN,  WBC,  LACTICIDVEN Microbiology Recent Results (from the past 240 hour(s))  SARS CORONAVIRUS 2 (TAT 6-24 HRS) Nasopharyngeal Nasopharyngeal Swab     Status: None   Collection Time: 11/24/18  5:55 AM   Specimen: Nasopharyngeal Swab  Result Value Ref Range Status   SARS Coronavirus 2 NEGATIVE NEGATIVE Final    Comment: (NOTE) SARS-CoV-2 target nucleic acids are NOT DETECTED. The SARS-CoV-2 RNA is generally detectable in upper and lower respiratory specimens during the acute phase of infection. Negative results do not preclude SARS-CoV-2 infection, do not rule out co-infections with other pathogens, and should not be used as the sole basis for treatment or other patient management decisions. Negative results must be combined with clinical observations, patient history, and epidemiological information. The expected result is Negative. Fact Sheet for Patients: SugarRoll.be Fact Sheet for Healthcare Providers: https://www.woods-mathews.com/ This test is not yet approved or  cleared by the [place] FDA and  has been authorized for detection and/or diagnosis of SARS-CoV-2 by FDA under an Emergency Use Authorization (EUA). This EUA will remain  in effect (meaning this test can be used) for the duration of the COVID-19 declaration under Section 56 4(b)(1) of the Act, 21 U.S.C. section 360bbb-3(b)(1), unless the authorization is terminated or revoked sooner. Performed at [facility], [place] [address], [place], [place] [address]   MRSA PCR Screening     Status: None   Collection Time: 11/24/18  7:04 AM   Specimen: Nasal Mucosa; Nasopharyngeal  Result Value Ref Range Status   MRSA by PCR NEGATIVE NEGATIVE Final    Comment:        The GeneXpert MRSA Assay (FDA approved for NASAL specimens  only), is one component of a comprehensive MRSA colonization surveillance program. It is not intended to diagnose MRSA infection nor to guide or monitor treatment for MRSA infections. Performed at [facility], [place] [address] [place], [place] [address]   Culture, Urine     Status: None   Collection Time: 11/24/18  8:45 AM   Specimen: Urine, Clean Catch  Result Value Ref Range Status   Specimen Description URINE, CLEAN CATCH  Final   Special Requests NONE  Final   Culture   Final    NO GROWTH Performed at [facility], [place] [address] [place], [place] [address]    Report Status 11/25/2018 FINAL  Final  Culture, blood (routine x 2)     Status: None (Preliminary result)   Collection Time: 11/24/18 10:15 AM   Specimen: BLOOD LEFT ARM  Result Value Ref Range Status   Specimen Description BLOOD LEFT ARM  Final   Special Requests   Final    BOTTLES DRAWN AEROBIC ONLY Blood Culture adequate volume   Culture   Final    NO GROWTH 2 DAYS Performed at [facility], [place] [address], [place], [place] [address]    Report Status PENDING  Incomplete  Culture, blood (routine x 2)     Status: None (Preliminary result)   Collection Time: 11/24/18  10:27 AM   Specimen: BLOOD LEFT HAND  Result Value Ref Range Status   Specimen Description BLOOD LEFT HAND  Final   Special Requests   Final    BOTTLES DRAWN AEROBIC ONLY Blood Culture results may not be optimal due to an inadequate volume of blood received in culture bottles   Culture   Final    NO GROWTH 2 DAYS Performed at [facility], [place] [address] [place],  [address]    Report Status PENDING  Incomplete     Time coordinating discharge: 52mins  SIGNED:   Kathie Dike, MD  [facility] 11/26/2018, 9:01 PM   If 7PM-7AM, please contact night-coverage [email or web address]

## 2018-11-26 NOTE — Plan of Care (Signed)
  Problem: Education: Goal: Knowledge of General Education information will improve Description: Including pain rating scale, medication(s)/side effects and non-pharmacologic comfort measures 11/26/2018 0731 by Shanon Ace, RN Outcome: Progressing 11/25/2018 1938 by Shanon Ace, RN Outcome: Progressing   Problem: Health Behavior/Discharge Planning: Goal: Ability to manage health-related needs will improve 11/26/2018 0731 by Shanon Ace, RN Outcome: Progressing 11/25/2018 1938 by Shanon Ace, RN Outcome: Progressing   Problem: Clinical Measurements: Goal: Ability to maintain clinical measurements within normal limits will improve 11/26/2018 0731 by Shanon Ace, RN Outcome: Progressing 11/25/2018 1938 by Shanon Ace, RN Outcome: Progressing Goal: Will remain free from infection 11/26/2018 0731 by Shanon Ace, RN Outcome: Progressing 11/25/2018 1938 by Shanon Ace, RN Outcome: Progressing Goal: Diagnostic test results will improve 11/26/2018 0731 by Shanon Ace, RN Outcome: Progressing 11/25/2018 1938 by Shanon Ace, RN Outcome: Progressing Goal: Respiratory complications will improve 11/26/2018 0731 by Shanon Ace, RN Outcome: Progressing 11/25/2018 1938 by Shanon Ace, RN Outcome: Progressing Goal: Cardiovascular complication will be avoided 11/26/2018 0731 by Shanon Ace, RN Outcome: Progressing 11/25/2018 1938 by Shanon Ace, RN Outcome: Progressing   Problem: Activity: Goal: Risk for activity intolerance will decrease 11/26/2018 0731 by Shanon Ace, RN Outcome: Progressing 11/25/2018 1938 by Shanon Ace, RN Outcome: Progressing   Problem: Nutrition: Goal: Adequate nutrition will be maintained 11/26/2018 0731 by Shanon Ace, RN Outcome: Progressing 11/25/2018 1938 by Shanon Ace, RN Outcome: Progressing   Problem: Coping: Goal: Level of anxiety will decrease 11/26/2018 0731 by Shanon Ace, RN Outcome: Progressing 11/25/2018 1938 by Shanon Ace, RN Outcome: Progressing   Problem: Elimination: Goal: Will not experience complications related to bowel motility 11/26/2018 0731 by Shanon Ace, RN Outcome: Progressing 11/25/2018 1938 by Shanon Ace, RN Outcome: Progressing Goal: Will not experience complications related to urinary retention 11/26/2018 0731 by Shanon Ace, RN Outcome: Progressing 11/25/2018 1938 by Shanon Ace, RN Outcome: Progressing   Problem: Pain Managment: Goal: General experience of comfort will improve 11/26/2018 0731 by Shanon Ace, RN Outcome: Progressing 11/25/2018 1938 by Shanon Ace, RN Outcome: Progressing   Problem: Safety: Goal: Ability to remain free from injury will improve 11/26/2018 0731 by Shanon Ace, RN Outcome: Progressing 11/25/2018 1938 by Shanon Ace, RN Outcome: Progressing   Problem: Skin Integrity: Goal: Risk for impaired skin integrity will decrease 11/26/2018 0731 by Shanon Ace, RN Outcome: Progressing 11/25/2018 1938 by Shanon Ace, RN Outcome: Progressing

## 2018-11-26 NOTE — Discharge Instructions (Signed)
Pleural Effusion °Pleural effusion is an abnormal buildup of fluid in the layers of tissue between the lungs and the inside of the chest (pleural space) The two layers of tissue that line the lungs and the inside of the chest are called pleura. Usually, there is no air in the space between the pleura, only a thin layer of fluid. Some conditions can cause a large amount of fluid to build up, which can cause the lung to collapse if untreated. A pleural effusion is usually caused by another disease that requires treatment. °What are the causes? °Pleural effusion can be caused by: °· Heart failure. °· Certain infections, such as pneumonia or tuberculosis. °· Cancer. °· A blood clot in the lung (pulmonary embolism). °· Complications from surgery, such as from open heart surgery. °· Liver disease (cirrhosis). °· Kidney disease. °What are the signs or symptoms? °In some cases, pleural effusion may cause no symptoms. If symptoms are present, they may include: °· Shortness of breath, especially when lying down. °· Chest pain. This may get worse when taking a deep breath. °· Fever. °· Dry, long-lasting (chronic) cough. °· Hiccups. °· Rapid breathing. °An underlying condition that is causing the pleural effusion (such as heart failure, pneumonia, blood clots, tuberculosis, or cancer) may also cause other symptoms. °How is this diagnosed? °This condition may be diagnosed based on: °· Your symptoms and medical history. °· A physical exam. °· A chest X-ray. °· A procedure to use a needle to remove fluid from the pleural space (thoracentesis). This fluid is tested. °· Other imaging studies of the chest, such as ultrasound or CT scan. °How is this treated? °Depending on the cause of your condition, treatment may include: °· Treating the underlying condition that is causing the effusion. When that condition improves, the effusion will also improve. Examples of treatment for underlying conditions include: °? Antibiotic medicines to  treat an infection. °? Diuretics or other heart medicines to treat heart failure. °· Thoracentesis. °· Placing a thin flexible tube under your skin and into your chest to continuously drain the effusion (indwelling pleural catheter). °· Surgery to remove the outer layer of tissue from the pleural space (decortication). °· A procedure to put medicine into the chest cavity to seal the pleural space and prevent fluid buildup (pleurodesis). °· Chemotherapy and radiation therapy, if you have cancerous (malignant) pleural effusion. These treatments are typically used to treat cancer. They kill certain cells in the body. °Follow these instructions at home: °· Take over-the-counter and prescription medicines only as told by your health care provider. °· Ask your health care provider what activities are safe for you. °· Keep track of how long you are able to do mild exercise (such as walking) before you get short of breath. Write down this information to share with your health care provider. Your ability to exercise should improve over time. °· Do not use any products that contain nicotine or tobacco, such as cigarettes and e-cigarettes. If you need help quitting, ask your health care provider. °· Keep all follow-up visits as told by your health care provider. This is important. °Contact a health care provider if: °· The amount of time that you are able to do mild exercise: °? Decreases. °? Does not improve with time. °· You have a fever. °Get help right away if: °· You are short of breath. °· You develop chest pain. °· You develop a new cough. °Summary °· Pleural effusion is an abnormal buildup of fluid in the layers   of tissue between the lungs and the inside of the chest.  Pleural effusion can have many causes, including heart failure, pulmonary embolism, infections, or cancer.  Symptoms of pleural effusion can include shortness of breath, chest pain, fever, long-lasting (chronic) cough, hiccups, or rapid  breathing.  Diagnosis often involves making images of the chest (such as with ultrasound or X-ray) and removing fluid (thoracentesis) to send for testing.  Treatment for pleural effusion depends on what underlying condition is causing it. This information is not intended to replace advice given to you by your health care provider. Make sure you discuss any questions you have with your health care provider. Document Released: 01/09/2005 Document Revised: 12/22/2016 Document Reviewed: 09/14/2016 Elsevier Patient Education  2020 [employer]. Pericardial Effusion  Pericardial effusion is a buildup of fluid around the heart. The heart is surrounded by a thin, double-layered sac (pericardium). When fluid builds up in this sac, it can put pressure on the heart and cause problems. When fluid builds up in the pericardial sac and pressure on the heart increases, it becomes harder for the heart to pump blood. The fluid can prevent the heart from pumping enough blood (cardiac tamponade). This can be life-threatening. What are the causes? Often, the cause of pericardial effusion is not known (idiopathic effusion). In some cases, the condition may be caused by:  Infections from a virus, fungus, parasite, or bacteria.  Damage to the pericardium from heart surgery or a heart attack.  Inflammatory diseases, such as rheumatoid arthritis or lupus.  Kidney disease.  Thyroid disease.  Cancer or treatment for cancer, including radiation or chemotherapy.  Certain medicines, including medicines for tuberculosis or seizures.  Chest injury. What are the signs or symptoms? Pericardial effusion may not cause symptoms at first, especially if the fluid builds up slowly. In time, pressure on the heart may cause:  Chest pain.  Trouble with breathing.  Pain and shortness of breath that get worse when lying down.  Dizziness.  Fainting.  Cough.  Hiccups.  Skipped heartbeats (palpitations).  Anxiety  and confusion.  A bluish skin color (cyanosis).  Swollen legs and ankles.  A feeling of fullness in the chest. How is this diagnosed? This condition is diagnosed based on your symptoms and testing, which may include:  A test that creates ultrasound images of your heart (echocardiogram).  A test to examine the electrical functions of your heart (electrocardiogram, ECG).  Chest X-ray.  CT scan.  MRI.  Blood tests. How is this treated? Treatment for this condition depends on the cause of your condition and how severe your symptoms are. Treatment may include:  Medicines, such as: ? NSAIDs or other anti-inflammatory medicines such as steroids. ? Antibiotic medicine. ? Antifungal medicine.  Hospital treatment. This may be necessary for cardiac tamponade. Treatment in the hospital may include: ? IV fluids. ? Breathing support.  Surgery. This may be needed in severe cases. Surgery may include: ? A procedure to remove fluid from the pericardium by placing a needle into it (pericardiocentesis). ? A procedure to make a permanent opening in the pericardium (pericardial window). ? Open heart surgery. Follow these instructions at home:  Take over-the-counter and prescription medicines only as told by your health care provider.  If you were prescribed antibiotic medicine, take it as told by your health care provider. Do not stop taking the antibiotic even if you start to feel better.  Rest as told by your health care provider. Ask your health care provider what activities are  safe for you.  Keep all follow-up visits as told by your health care provider. This is important. Contact a health care provider if:  You have a cough or hiccups that do not go away.  You have severe swelling in your legs or ankles. Get help right away if:  You have fast or irregular heartbeats (palpitations).  You feel dizzy or light-headed.  You faint.  You have chest pain.  You have trouble  breathing. These symptoms may represent a serious problem that is an emergency. Do not wait to see if the symptoms will go away. Get medical help right away. Call your local emergency services (911 in the [place] Do not drive yourself to the hospital. Summary  Pericardial effusion is a buildup of fluid around the heart. The fluid can eventually prevent the heart from pumping enough blood (cardiac tamponade), which can be life-threatening.  Pericardial effusion may not cause symptoms at first.  Treatment for pericardial effusion depends on the cause of your condition and how severe your symptoms are. In severe cases, hospital treatment or surgery may be required.  Rest as told by your health care provider. Ask your health care provider what activities are safe for you. This information is not intended to replace advice given to you by your health care provider. Make sure you discuss any questions you have with your health care provider. Document Released: 09/06/2004 Document Revised: 12/22/2016 Document Reviewed: 02/17/2016 Elsevier Patient Education  2020 [employer]. Diabetic Ketoacidosis Diabetic ketoacidosis is a serious complication of diabetes. This condition develops when there is not enough insulin in the body. Insulin is an hormone that regulates blood sugar levels in the body. Normally, insulin allows glucose to enter the cells in the body. The cells break down glucose for energy. Without enough insulin, the body cannot break down glucose, so it breaks down fats instead. This leads to high blood glucose levels in the body and the production of acids that are called ketones. Ketones are poisonous at high levels. If diabetic ketoacidosis is not treated, it can cause severe dehydration and can lead to a coma or death. What are the causes? This condition develops when a lack of insulin causes the body to break down fats instead of glucose. This may be triggered by:  Stress on the body. This  stress is brought on by an illness.  Infection.  Medicines that raise blood glucose levels.  Not taking diabetes medicine.  New onset of type 1 diabetes mellitus. What are the signs or symptoms? Symptoms of this condition include:  Fatigue.  Weight loss.  Excessive thirst.  Light-headedness.  Fruity or sweet-smelling breath.  Excessive urination.  Vision changes.  Confusion or irritability.  Nausea.  Vomiting.  Rapid breathing.  Abdominal pain.  Feeling flushed. How is this diagnosed? This condition is diagnosed based on your medical history, a physical exam, and blood tests. You may also have a urine test to check for ketones. How is this treated? This condition may be treated with:  Fluid replacement. This may be done to correct dehydration.  Insulin injections. These may be given through the skin or through an IV tube.  Electrolyte replacement. Electrolytes are minerals in your blood. Electrolytes such as potassium and sodium may be given in pill form or through an IV tube.  Antibiotic medicines. These may be prescribed if your condition was caused by an infection. Diabetic ketoacidosis is a serious medical condition. You may need emergency treatment in the hospital to monitor your  condition. Follow these instructions at home: Eating and drinking  Drink enough fluids to keep your urine clear or pale yellow.  If you are not able to eat, drink clear fluids in small amounts as you are able. Clear fluids include water, ice chips, fruit juice with water added (diluted), and low-calorie sports drinks. You may also have sugar-free jello or popsicles.  If you are able to eat, follow your usual diet and drink sugar-free liquids, such as water. Medicines  Take over-the-counter and prescription medicines only as told by your health care provider.  Continue to take insulin and other diabetes medicines as told by your health care provider.  If you were prescribed  an antibiotic, take it as told by your health care provider. Do not stop taking the antibiotic even if you start to feel better. General instructions   Check your urine for ketones when you are ill and as told by your health care provider. ? If your blood glucose is 240 mg/dL (13.3 mmol/L) or higher, check your urine ketones every 4-6 hours.  Check your blood glucose every day, as often as told by your health care provider. ? If your blood glucose is high, drink plenty of fluids. This helps to flush out ketones. ? If your blood glucose is above your target for 2 tests in a row, contact your health care provider.  Carry a medical alert card or wear medical alert jewelry that says that you have diabetes.  Rest and exercise only as told by your health care provider. Do not exercise when your blood glucose is high and you have ketones in your urine.  If you get sick, call your health care provider and begin treatment quickly. Your body often needs extra insulin to fight an illness. Check your blood glucose every 4-6 hours when you are sick.  Keep all follow-up visits as told by your health care provider. This is important. Contact a health care provider if:  Your blood glucose level is higher than 240 mg/dL (13.3 mmol/L) for 2 days in a row.  You have moderate or large ketones in your urine.  You have a fever.  You cannot eat or drink without vomiting.  You have been vomiting for more than 2 hours.  You continue to have symptoms of diabetic ketoacidosis.  You develop new symptoms. Get help right away if:  Your blood glucose monitor reads high even when you are taking insulin.  You faint.  You have chest pain.  You have trouble breathing.  You have sudden trouble speaking or swallowing.  You have vomiting or diarrhea that gets worse after 3 hours.  You are unable to stay awake.  You have trouble thinking.  You are severely dehydrated. Symptoms of severe dehydration  include: ? Extreme thirst. ? Dry mouth. ? Rapid breathing. These symptoms may represent a serious problem that is an emergency. Do not wait to see if the symptoms will go away. Get medical help right away. Call your local emergency services (911 in the [place] Do not drive yourself to the hospital. Summary  Diabetic ketoacidosis is a serious complication of diabetes. This condition develops when there is not enough insulin in the body.  This condition is diagnosed based on your medical history, a physical exam, and blood tests. You may also have a urine test to check for ketones.  Diabetic ketoacidosis is a serious medical condition. You may need emergency treatment in the hospital to monitor your condition.  Contact your health  care provider if your blood glucose is higher than 240 mg/dl for 2 days in a row or if you have moderate or large ketones in your urine. This information is not intended to replace advice given to you by your health care provider. Make sure you discuss any questions you have with your health care provider. Document Released: 01/07/2000 Document Revised: 02/25/2016 Document Reviewed: 02/14/2016 Elsevier Patient Education  2020 [employer].

## 2018-11-26 NOTE — Progress Notes (Addendum)
      [address]       [place] [address]             [phone]            Subjective: Patient states vomiting has resolved. She is trying to eat breakfast this am.  Objective: Vital signs in last 24 hours: Temp:  [97.8 F (36.6 C)-98.7 F (37.1 C)] 98.1 F (36.7 C) (11/02 2307) Pulse Rate:  [96-107] 97 (11/02 2307) Cardiac Rhythm: Normal sinus rhythm (11/02 2307) Resp:  [10-27] 19 (11/02 2307) BP: (98-119)/(61-79) 104/61 (11/02 2307) SpO2:  [96 %-97 %] 97 % (11/02 2307)   Current Weight  11/15/18 68 kg       Intake/Output from previous day: 11/02 0701 - 11/03 0700 In: 48 [P.O.:720; IV Piggyback:100] Out: -    Physical Exam:  Cardiovascular: RRR Pulmonary: Clear to auscultation bilaterally Abdomen: Soft, non tender, bowel sounds present. Extremities: No lower extremity edema. Wounds: Clean and dry.  No erythema or signs of infection.  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 ST this am. On Colchicine for pericarditis, pleuritis 2.  Pulmonary - On room air. Has ? moderate right pleural effusion (seen on CT, not on CXR). As discussed with Dr. Orvan Seen, likely more atelectasis than fluid on the right.  3. DM-has Insulin pump, has a history of frequent DKA. HGA1C 10.2 4. GI-admitted for vomiting and epigastric pain, likely related to DKA 5. Mild anemia-H and H stable at 10.4  and 32.9 6. Hypothyroidism-on Levothyroxine 100 mcg daily 7. Appreciate medicine's assistance  Jennifer Claar M ZimmermanPA-C 11/26/2018,7:07 AM

## 2018-11-26 NOTE — Plan of Care (Signed)
  Problem: Education: Goal: Knowledge of General Education information will improve Description: Including pain rating scale, medication(s)/side effects and non-pharmacologic comfort measures 11/26/2018 1140 by Shanon Ace, RN Outcome: Completed/Met 11/26/2018 0731 by Shanon Ace, RN Outcome: Progressing 11/26/2018 0731 by Shanon Ace, RN Outcome: Progressing   Problem: Health Behavior/Discharge Planning: Goal: Ability to manage health-related needs will improve 11/26/2018 1140 by Shanon Ace, RN Outcome: Completed/Met 11/26/2018 0731 by Shanon Ace, RN Outcome: Progressing 11/26/2018 0731 by Shanon Ace, RN Outcome: Progressing   Problem: Clinical Measurements: Goal: Ability to maintain clinical measurements within normal limits will improve 11/26/2018 1140 by Shanon Ace, RN Outcome: Completed/Met 11/26/2018 0731 by Shanon Ace, RN Outcome: Progressing 11/26/2018 0731 by Shanon Ace, RN Outcome: Progressing Goal: Will remain free from infection 11/26/2018 1140 by Shanon Ace, RN Outcome: Completed/Met 11/26/2018 0731 by Shanon Ace, RN Outcome: Progressing 11/26/2018 0731 by Shanon Ace, RN Outcome: Progressing Goal: Diagnostic test results will improve 11/26/2018 1140 by Shanon Ace, RN Outcome: Completed/Met 11/26/2018 0731 by Shanon Ace, RN Outcome: Progressing 11/26/2018 0731 by Shanon Ace, RN Outcome: Progressing Goal: Respiratory complications will improve 11/26/2018 1140 by Shanon Ace, RN Outcome: Completed/Met 11/26/2018 0731 by Shanon Ace, RN Outcome: Progressing 11/26/2018 0731 by Shanon Ace, RN Outcome: Progressing Goal: Cardiovascular complication will be avoided 11/26/2018 1140 by Shanon Ace, RN Outcome: Completed/Met 11/26/2018 0731 by Shanon Ace, RN Outcome: Progressing 11/26/2018 0731 by Shanon Ace, RN Outcome: Progressing   Problem: Education: Goal: Knowledge of General Education information will  improve Description: Including pain rating scale, medication(s)/side effects and non-pharmacologic comfort measures 11/26/2018 1140 by Shanon Ace, RN Outcome: Completed/Met 11/26/2018 0731 by Shanon Ace, RN Outcome: Progressing 11/26/2018 0731 by Shanon Ace, RN Outcome: Progressing   Problem: Activity: Goal: Risk for activity intolerance will decrease 11/26/2018 1140 by Shanon Ace, RN Outcome: Completed/Met 11/26/2018 0731 by Shanon Ace, RN Outcome: Progressing 11/26/2018 0731 by Shanon Ace, RN Outcome: Progressing   Problem: Nutrition: Goal: Adequate nutrition will be maintained 11/26/2018 1140 by Shanon Ace, RN Outcome: Completed/Met 11/26/2018 0731 by Shanon Ace, RN Outcome: Progressing 11/26/2018 0731 by Shanon Ace, RN Outcome: Progressing   Problem: Coping: Goal: Level of anxiety will decrease 11/26/2018 1140 by Shanon Ace, RN Outcome: Completed/Met 11/26/2018 0731 by Shanon Ace, RN Outcome: Progressing 11/26/2018 0731 by Shanon Ace, RN Outcome: Progressing   Problem: Elimination: Goal: Will not experience complications related to bowel motility 11/26/2018 1140 by Shanon Ace, RN Outcome: Completed/Met 11/26/2018 0731 by Shanon Ace, RN Outcome: Progressing 11/26/2018 0731 by Shanon Ace, RN Outcome: Progressing Goal: Will not experience complications related to urinary retention 11/26/2018 1140 by Shanon Ace, RN Outcome: Completed/Met 11/26/2018 0731 by Shanon Ace, RN Outcome: Progressing 11/26/2018 0731 by Shanon Ace, RN Outcome: Progressing   Problem: Pain Managment: Goal: General experience of comfort will improve 11/26/2018 1140 by Shanon Ace, RN Outcome: Completed/Met 11/26/2018 0731 by Shanon Ace, RN Outcome: Progressing 11/26/2018 0731 by Shanon Ace, RN Outcome: Progressing   Problem: Safety: Goal: Ability to remain free from injury will improve 11/26/2018 1140 by Shanon Ace, RN Outcome:  Completed/Met 11/26/2018 0731 by Shanon Ace, RN Outcome: Progressing 11/26/2018 0731 by Shanon Ace, RN Outcome: Progressing   Problem: Skin Integrity: Goal: Risk for impaired skin integrity will decrease 11/26/2018 1140 by Shanon Ace, RN Outcome: Completed/Met 11/26/2018 0731 by Shanon Ace, RN Outcome: Progressing 11/26/2018 0731 by Shanon Ace, RN Outcome: Progressing

## 2018-11-27 ENCOUNTER — Telehealth: Payer: Self-pay

## 2018-11-27 NOTE — Telephone Encounter (Signed)
Transition Care Management Follow-up Telephone Call Date of discharge and from where: 11/26/2018, [facility]   Call placed to patient # [phone], message left with call back requested to this CM # [phone]

## 2018-11-28 ENCOUNTER — Other Ambulatory Visit: Payer: Self-pay

## 2018-11-28 ENCOUNTER — Encounter (HOSPITAL_COMMUNITY): Payer: Self-pay | Admitting: Emergency Medicine

## 2018-11-28 ENCOUNTER — Emergency Department (HOSPITAL_COMMUNITY): Payer: Self-pay

## 2018-11-28 ENCOUNTER — Inpatient Hospital Stay (HOSPITAL_COMMUNITY)
Admission: EM | Admit: 2018-11-28 | Discharge: 2018-12-02 | DRG: 074 | Disposition: A | Discharge status: Home / Self Care | Payer: Self-pay | Attending: Family Medicine | Admitting: Family Medicine

## 2018-11-28 ENCOUNTER — Telehealth: Payer: Self-pay

## 2018-11-28 DIAGNOSIS — Z951 Presence of aortocoronary bypass graft: Secondary | ICD-10-CM

## 2018-11-28 DIAGNOSIS — J9811 Atelectasis: Secondary | ICD-10-CM | POA: Diagnosis present

## 2018-11-28 DIAGNOSIS — I252 Old myocardial infarction: Secondary | ICD-10-CM

## 2018-11-28 DIAGNOSIS — I5022 Chronic systolic (congestive) heart failure: Secondary | ICD-10-CM | POA: Diagnosis present

## 2018-11-28 DIAGNOSIS — Z8249 Family history of ischemic heart disease and other diseases of the circulatory system: Secondary | ICD-10-CM

## 2018-11-28 DIAGNOSIS — E1010 Type 1 diabetes mellitus with ketoacidosis without coma: Secondary | ICD-10-CM | POA: Diagnosis present

## 2018-11-28 DIAGNOSIS — K3184 Gastroparesis: Secondary | ICD-10-CM | POA: Diagnosis present

## 2018-11-28 DIAGNOSIS — Z7982 Long term (current) use of aspirin: Secondary | ICD-10-CM

## 2018-11-28 DIAGNOSIS — Z7902 Long term (current) use of antithrombotics/antiplatelets: Secondary | ICD-10-CM

## 2018-11-28 DIAGNOSIS — Z79890 Hormone replacement therapy (postmenopausal): Secondary | ICD-10-CM

## 2018-11-28 DIAGNOSIS — R194 Change in bowel habit: Secondary | ICD-10-CM

## 2018-11-28 DIAGNOSIS — E1065 Type 1 diabetes mellitus with hyperglycemia: Secondary | ICD-10-CM | POA: Diagnosis present

## 2018-11-28 DIAGNOSIS — I2510 Atherosclerotic heart disease of native coronary artery without angina pectoris: Secondary | ICD-10-CM | POA: Diagnosis present

## 2018-11-28 DIAGNOSIS — R197 Diarrhea, unspecified: Secondary | ICD-10-CM | POA: Diagnosis present

## 2018-11-28 DIAGNOSIS — R739 Hyperglycemia, unspecified: Secondary | ICD-10-CM

## 2018-11-28 DIAGNOSIS — I255 Ischemic cardiomyopathy: Secondary | ICD-10-CM | POA: Diagnosis present

## 2018-11-28 DIAGNOSIS — E039 Hypothyroidism, unspecified: Secondary | ICD-10-CM | POA: Diagnosis present

## 2018-11-28 DIAGNOSIS — Z794 Long term (current) use of insulin: Secondary | ICD-10-CM

## 2018-11-28 DIAGNOSIS — Z79891 Long term (current) use of opiate analgesic: Secondary | ICD-10-CM

## 2018-11-28 DIAGNOSIS — Z79899 Other long term (current) drug therapy: Secondary | ICD-10-CM

## 2018-11-28 DIAGNOSIS — F1290 Cannabis use, unspecified, uncomplicated: Secondary | ICD-10-CM | POA: Diagnosis present

## 2018-11-28 DIAGNOSIS — Z20828 Contact with and (suspected) exposure to other viral communicable diseases: Secondary | ICD-10-CM | POA: Diagnosis present

## 2018-11-28 DIAGNOSIS — Z9641 Presence of insulin pump (external) (internal): Secondary | ICD-10-CM | POA: Diagnosis present

## 2018-11-28 DIAGNOSIS — F419 Anxiety disorder, unspecified: Secondary | ICD-10-CM | POA: Diagnosis present

## 2018-11-28 DIAGNOSIS — Z955 Presence of coronary angioplasty implant and graft: Secondary | ICD-10-CM

## 2018-11-28 DIAGNOSIS — K219 Gastro-esophageal reflux disease without esophagitis: Secondary | ICD-10-CM | POA: Diagnosis present

## 2018-11-28 DIAGNOSIS — E860 Dehydration: Secondary | ICD-10-CM | POA: Diagnosis present

## 2018-11-28 DIAGNOSIS — E1043 Type 1 diabetes mellitus with diabetic autonomic (poly)neuropathy: Principal | ICD-10-CM | POA: Diagnosis present

## 2018-11-28 DIAGNOSIS — R109 Unspecified abdominal pain: Secondary | ICD-10-CM | POA: Diagnosis present

## 2018-11-28 DIAGNOSIS — R112 Nausea with vomiting, unspecified: Secondary | ICD-10-CM

## 2018-11-28 DIAGNOSIS — F329 Major depressive disorder, single episode, unspecified: Secondary | ICD-10-CM | POA: Diagnosis present

## 2018-11-28 LAB — CBG MONITORING, ED
Glucose-Capillary: 281 mg/dL — ABNORMAL HIGH (ref 70–99)
Glucose-Capillary: 199 mg/dL — ABNORMAL HIGH (ref 70–99)

## 2018-11-28 LAB — I-STAT BETA HCG BLOOD, ED (MC, WL, AP ONLY): I-stat hCG, quantitative: <5 m[IU]/mL (ref <5)

## 2018-11-28 LAB — CBC
HCT: 33 % — ABNORMAL LOW (ref 36.0–46.0)
Hemoglobin: 10.7 g/dL — ABNORMAL LOW (ref 12.0–15.0)
MCH: 29.8 pg (ref 26.0–34.0)
MCHC: 32.4 g/dL (ref 30.0–36.0)
MCV: 91.9 fL (ref 80.0–100.0)
Platelets: 593 10*3/uL — ABNORMAL HIGH (ref 150–400)
RBC: 3.59 MIL/uL — ABNORMAL LOW (ref 3.87–5.11)
RDW: 14.8 % (ref 11.5–15.5)
WBC: 10.2 10*3/uL (ref 4.0–10.5)
nRBC: 0 % (ref 0.0–0.2)

## 2018-11-28 LAB — URINALYSIS, ROUTINE W REFLEX MICROSCOPIC
Bacteria, UA: NONE SEEN
Bilirubin Urine: NEGATIVE
Glucose, UA: >=500 mg/dL — AB
Ketones, ur: 20 mg/dL — AB
Leukocytes,Ua: NEGATIVE
Nitrite: NEGATIVE
Protein, ur: NEGATIVE mg/dL
Specific Gravity, Urine: 1.016 (ref 1.005–1.030)
pH: 5 (ref 5.0–8.0)

## 2018-11-28 LAB — COMPREHENSIVE METABOLIC PANEL
ALT: 14 U/L (ref 0–44)
AST: 20 U/L (ref 15–41)
Albumin: 3.1 g/dL — ABNORMAL LOW (ref 3.5–5.0)
Alkaline Phosphatase: 92 U/L (ref 38–126)
Anion gap: 15 (ref 5–15)
BUN: 9 mg/dL (ref 6–20)
CO2: 17 mmol/L — ABNORMAL LOW (ref 22–32)
Calcium: 8.4 mg/dL — ABNORMAL LOW (ref 8.9–10.3)
Chloride: 103 mmol/L (ref 98–111)
Creatinine, Ser: 0.84 mg/dL (ref 0.44–1.00)
GFR calc Af Amer: >60 mL/min (ref >60)
GFR calc non Af Amer: >60 mL/min (ref >60)
Glucose, Bld: 303 mg/dL — ABNORMAL HIGH (ref 70–99)
Potassium: 3.9 mmol/L (ref 3.5–5.1)
Sodium: 135 mmol/L (ref 135–145)
Total Bilirubin: 0.9 mg/dL (ref 0.3–1.2)
Total Protein: 7.1 g/dL (ref 6.5–8.1)

## 2018-11-28 LAB — LIPASE, BLOOD: Lipase: 19 U/L (ref 11–51)

## 2018-11-28 LAB — LACTIC ACID, PLASMA: Lactic Acid, Venous: 1.5 mmol/L (ref 0.5–1.9)

## 2018-11-28 MED ORDER — COLCHICINE 0.6 MG PO TABS
0.6000 mg | ORAL_TABLET | Freq: Two times a day (BID) | ORAL | Status: DC
  Administered 2018-11-29 (×2): 0.6 mg via ORAL
  Filled 2018-11-28 (×4): qty 1

## 2018-11-28 MED ORDER — PROMETHAZINE HCL 25 MG/ML IJ SOLN
25.0000 mg | Freq: Once | INTRAMUSCULAR | Status: AC
  Administered 2018-11-28: 18:00:00 25 mg via INTRAMUSCULAR
  Filled 2018-11-28: qty 1

## 2018-11-28 MED ORDER — SODIUM CHLORIDE 0.9 % IV SOLN
INTRAVENOUS | Status: DC
  Administered 2018-11-28: 23:00:00 via INTRAVENOUS

## 2018-11-28 MED ORDER — ONDANSETRON HCL 4 MG/2ML IJ SOLN
4.0000 mg | Freq: Four times a day (QID) | INTRAMUSCULAR | Status: DC | PRN
  Administered 2018-11-30: 4 mg via INTRAVENOUS
  Filled 2018-11-28: qty 2

## 2018-11-28 MED ORDER — ACETAMINOPHEN 650 MG RE SUPP: 650.0000 mg | Freq: Four times a day (QID) | RECTAL | Status: DC | PRN

## 2018-11-28 MED ORDER — LORAZEPAM 2 MG/ML IJ SOLN
1.0000 mg | Freq: Once | INTRAMUSCULAR | Status: AC
  Administered 2018-11-28: 21:00:00 1 mg via INTRAVENOUS
  Filled 2018-11-28: qty 1

## 2018-11-28 MED ORDER — SODIUM CHLORIDE 0.9 % IV BOLUS: 1000.0000 mL | Freq: Once | INTRAVENOUS | Status: DC

## 2018-11-28 MED ORDER — IBUPROFEN 600 MG PO TABS
800.0000 mg | ORAL_TABLET | Freq: Three times a day (TID) | ORAL | Status: DC
  Administered 2018-11-29: 800 mg via ORAL
  Filled 2018-11-28 (×2): qty 1

## 2018-11-28 MED ORDER — METOCLOPRAMIDE HCL 5 MG/ML IJ SOLN
5.0000 mg | Freq: Four times a day (QID) | INTRAMUSCULAR | Status: DC
  Administered 2018-11-29 – 2018-12-02 (×14): 5 mg via INTRAVENOUS
  Filled 2018-11-28 (×15): qty 2

## 2018-11-28 MED ORDER — SODIUM CHLORIDE 0.9% FLUSH
3.0000 mL | Freq: Once | INTRAVENOUS | Status: AC
  Administered 2018-11-28: 3 mL via INTRAVENOUS

## 2018-11-28 MED ORDER — ATORVASTATIN CALCIUM 80 MG PO TABS
80.0000 mg | ORAL_TABLET | Freq: Every day | ORAL | Status: DC
  Administered 2018-11-29 – 2018-12-01 (×2): 80 mg via ORAL
  Filled 2018-11-28 (×3): qty 1

## 2018-11-28 MED ORDER — PANTOPRAZOLE SODIUM 40 MG IV SOLR
40.0000 mg | INTRAVENOUS | Status: DC
  Administered 2018-11-28 – 2018-12-01 (×4): 40 mg via INTRAVENOUS
  Filled 2018-11-28 (×4): qty 40

## 2018-11-28 MED ORDER — SODIUM CHLORIDE 0.9 % IV BOLUS
1000.0000 mL | Freq: Once | INTRAVENOUS | Status: AC
  Administered 2018-11-28: 1000 mL via INTRAVENOUS

## 2018-11-28 MED ORDER — ACETAMINOPHEN 325 MG PO TABS: 650.0000 mg | ORAL_TABLET | Freq: Four times a day (QID) | ORAL | Status: DC | PRN

## 2018-11-28 MED ORDER — ONDANSETRON HCL 4 MG/2ML IJ SOLN
4.0000 mg | Freq: Once | INTRAMUSCULAR | Status: AC
  Administered 2018-11-28: 16:00:00 4 mg via INTRAVENOUS
  Filled 2018-11-28: qty 2

## 2018-11-28 MED ORDER — ISOSORBIDE DINITRATE 10 MG PO TABS
5.0000 mg | ORAL_TABLET | Freq: Three times a day (TID) | ORAL | Status: DC
  Administered 2018-11-29 – 2018-12-01 (×5): 5 mg via ORAL
  Administered 2018-12-01: 0.5 mg via ORAL
  Administered 2018-12-02: 5 mg via ORAL
  Filled 2018-11-28 (×9): qty 1

## 2018-11-28 MED ORDER — ENOXAPARIN SODIUM 40 MG/0.4ML ~~LOC~~ SOLN
40.0000 mg | SUBCUTANEOUS | Status: DC
  Administered 2018-11-28 – 2018-12-01 (×4): 40 mg via SUBCUTANEOUS
  Filled 2018-11-28 (×4): qty 0.4

## 2018-11-28 MED ORDER — CLOPIDOGREL BISULFATE 75 MG PO TABS
75.0000 mg | ORAL_TABLET | Freq: Every day | ORAL | Status: DC
  Administered 2018-12-01 – 2018-12-02 (×2): 75 mg via ORAL
  Filled 2018-11-28 (×4): qty 1

## 2018-11-28 MED ORDER — SODIUM CHLORIDE 0.9 % IV SOLN
10.0000 mg | Freq: Two times a day (BID) | INTRAVENOUS | Status: DC
  Administered 2018-11-28 – 2018-11-29 (×3): 10 mg via INTRAVENOUS
  Filled 2018-11-28 (×6): qty 1

## 2018-11-28 MED ORDER — FLUOXETINE HCL 20 MG PO CAPS
40.0000 mg | ORAL_CAPSULE | Freq: Every day | ORAL | Status: DC
  Administered 2018-11-29 – 2018-12-02 (×3): 40 mg via ORAL
  Filled 2018-11-28 (×5): qty 2

## 2018-11-28 MED ORDER — INSULIN ASPART 100 UNIT/ML ~~LOC~~ SOLN: 0.0000 [IU] | SUBCUTANEOUS | Status: DC

## 2018-11-28 MED ORDER — SODIUM CHLORIDE 0.9 % IV BOLUS
500.0000 mL | Freq: Once | INTRAVENOUS | Status: AC
  Administered 2018-11-28: 18:00:00 500 mL via INTRAVENOUS

## 2018-11-28 MED ORDER — METOPROLOL TARTRATE 12.5 MG HALF TABLET
12.5000 mg | ORAL_TABLET | Freq: Two times a day (BID) | ORAL | Status: DC
  Administered 2018-11-28 – 2018-12-02 (×6): 12.5 mg via ORAL
  Filled 2018-11-28 (×8): qty 1

## 2018-11-28 NOTE — ED Notes (Signed)
Admitting provider at bedside.

## 2018-11-28 NOTE — ED Triage Notes (Signed)
Pt arrives via EMS from home with reports of abd pain and nausea. CBG 332 100 mcg fentanyl and 4 mg zofran given by EMS.

## 2018-11-28 NOTE — H&P (Signed)
History and Physical    Jennifer Hogan [id number] DOB: 20-Aug-1970 DOA: 11/28/2018  PCP: [name], NP Patient coming from: Home  Chief Complaint: Abdominal pain, emesis  HPI: Jennifer Hogan is a 48 y.o. 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  Patient states she was doing okay yesterday.  Since this morning she is having epigastric abdominal pain/cramps with a burning sensation.  She has had multiple episodes of vomiting and has not been able to keep any food down.  She also had a few episodes of nonbloody diarrhea.  Denies recent antibiotic use.  Protonix was prescribed to her to be taken along with NSAIDs during her recent hospitalization.  It seems she has not been taking this medication.  No additional history could be obtained as patient had her eyes closed and did not answer any other questions.  ED Course: Tachycardic with heart rate in the 110s to 120s, remainder of vitals stable.  Afebrile and no leukocytosis.  Lipase and LFTs normal.  Lactic acid level pending.  Blood glucose 303.  Bicarb 17, anion gap 15.  UA with ketones.  Patient received a 500 cc normal saline bolus and blood glucose improved to 199.  Chest x-ray showing small left pleural effusion and worsening left lower lobe atelectasis or infiltrate. Patient received Ativan, Zofran, Phenergan, a 500 cc normal saline bolus.  Review of Systems:  All systems reviewed and apart from history of presenting illness, are negative.  Past Medical History:  Diagnosis Date   Anemia    Anxiety    Coronary artery disease    a. s/pPTCA DES x3 in the LAD (ostial DES placed, mid DES placed, distal DES placed)and PTCA/DES x1to intermediate branch 02/2017. b. ACS 02/08/18 with LCx  stent placement.   Depression    Diabetes mellitus    type 1, on insulin pump   DKA (diabetic ketoacidoses) ([place]) 12/31/2017   Elevated platelet count    High cholesterol    Hypothyroidism    Ischemic cardiomyopathy    a. EF low-normal with basal inferior akinesis, basal septal hypokinesis by echo 01/2018.    Past Surgical History:  Procedure Laterality Date   APPENDECTOMY     CORONARY ARTERY BYPASS GRAFT N/A 11/04/2018   Procedure: CORONARY ARTERY BYPASS GRAFTING (CABG), ON PUMP, TIMES THREE, USING LEFT AND RIGHT INTERNAL MAMMARY ARTERIES;  Surgeon: Wonda Olds, MD;  Location: [facility];  Service: Open Heart Surgery;  Laterality: N/A;   CORONARY STENT INTERVENTION N/A 03/20/2017   Procedure: DES x 3 LAD, DES OM; Surgeon: Burnell Blanks, MD;  Location: [facility];  Service: Cardiovascular;  Laterality: N/A;   CORONARY/GRAFT ACUTE MI REVASCULARIZATION N/A 02/08/2018   Procedure: Coronary/Graft Acute MI Revascularization;  Surgeon: Belva Crome, MD;  Location: [facility];  Service: Cardiovascular;  Laterality: N/A;   LEFT HEART CATH AND CORONARY ANGIOGRAPHY N/A 03/20/2017   Procedure: LEFT HEART CATH AND CORONARY ANGIOGRAPHY;  Surgeon: Burnell Blanks, MD;  Location: [facility];  Service: Cardiovascular;  Laterality: N/A;   LEFT HEART CATH AND CORONARY ANGIOGRAPHY N/A 02/08/2018   Procedure: LEFT HEART CATH AND CORONARY ANGIOGRAPHY;  Surgeon: Belva Crome, MD;  Location: [facility];  Service: Cardiovascular;  Laterality: N/A;   LEFT HEART CATH  AND CORONARY ANGIOGRAPHY N/A 10/29/2018   Procedure: LEFT HEART CATH AND CORONARY ANGIOGRAPHY;  Surgeon: Burnell Blanks, MD;  Location: [facility];  Service: Cardiovascular;  Laterality: N/A;   TEE WITHOUT CARDIOVERSION N/A 11/04/2018   Procedure: TRANSESOPHAGEAL ECHOCARDIOGRAM (TEE);  Surgeon: Wonda Olds, MD;  Location: [facility];  Service: Open Heart Surgery;   Laterality: N/A;     reports that she has never smoked. She has never used smokeless tobacco. She reports current drug use. Drug: Marijuana. She reports that she does not drink alcohol.  Allergies  Allergen Reactions   Crestor [Rosuvastatin Calcium] Other (See Comments)    Severe myalgias and joint pain. Has tolerated atorvastatin though   Peanut-Containing Drug Products Other (See Comments)    Vomiting, upset stomach and some wheezing   Ciprofloxin Hcl [Ciprofloxacin] Nausea And Vomiting and Other (See Comments)    Severe migraine   Food Color Red [Red Dye] Diarrhea    Family History  Problem Relation Age of Onset   Coronary artery disease Father    Congestive Heart Failure Father    Asthma Father    Cancer Mother        T cell lymphoma    Prior to Admission medications   Medication Sig Start Date End Date Taking? Authorizing Provider  acetaminophen (TYLENOL) 325 MG tablet Take 650 mg by mouth every 6 (six) hours as needed for mild pain or moderate pain.    [provider]  albuterol (PROVENTIL HFA;VENTOLIN HFA) 108 (90 Base) MCG/ACT inhaler Inhale 2 puffs into the lungs every 6 (six) hours as needed for wheezing or shortness of breath. 12/07/17 11/24/18  Damita Lack, MD  aspirin 81 MG EC tablet Take 1 tablet (81 mg total) by mouth daily. 12/07/17   Amin, Jeanella Flattery, MD  atorvastatin (LIPITOR) 80 MG tablet Take 1 tablet (80 mg total) by mouth daily at 6 PM. 11/09/18   Barrett, Erin R, PA-C  clopidogrel (PLAVIX) 75 MG tablet Take 1 tablet (75 mg total) by mouth daily with breakfast. Please keep upcoming appt in November for future refills. Thank you 11/12/18   [name], MD  colchicine 0.6 MG tablet Take 1 tablet (0.6 mg total) by mouth 2 (two) times daily. 11/26/18   Kathie Dike, MD  FLUoxetine (PROZAC) 40 MG capsule Take 1 capsule (40 mg total) by mouth daily. 11/15/18 11/15/19  Wonda Olds, MD  furosemide (LASIX) 20 MG tablet Take 1 tablet  (20 mg total) by mouth daily. 11/09/18   Barrett, Erin R, PA-C  ibuprofen (ADVIL) 400 MG tablet Take 2 tablets (800 mg total) by mouth 3 (three) times daily. 11/26/18   Kathie Dike, MD  insulin aspart (NOVOLOG) 100 UNIT/ML injection 35 units per insulin pump daily Patient taking differently: Inject 35 Units into the skin daily. insulin pump daily 10/31/17   Renato Shin, MD  ipratropium (ATROVENT) 0.06 % nasal spray Place 2 sprays into both nostrils 4 (four) times daily. Patient taking differently: Place 2 sprays into both nostrils 2 (two) times a day.  02/27/18   Tasia Catchings, Amy V, PA-C  isosorbide dinitrate (ISORDIL) 5 MG tablet Take 1 tablet (5 mg total) by mouth 3 (three) times daily. 11/09/18   Barrett, Lodema Hong, PA-C  levothyroxine (EUTHYROX) 100 MCG tablet Take 1 tablet (100 mcg total) by mouth daily before breakfast. Must have office visit for refills 10/23/18   Charlott Rakes, MD  metoprolol tartrate (LOPRESSOR) 25 MG tablet Take 0.5 tablets (12.5 mg  total) by mouth 2 (two) times daily. Hold for SBP<90 and HR<50 11/09/18   Barrett, Erin R, PA-C  montelukast (SINGULAIR) 10 MG tablet TAKE 1 TABLET BY MOUTH AT BEDTIME Patient taking differently: Take 10 mg by mouth at bedtime.  09/16/18   [name], NP  nitroGLYCERIN (NITROSTAT) 0.4 MG SL tablet Place 1 tablet (0.4 mg total) under the tongue every 5 (five) minutes as needed for chest pain. 12/07/17   Amin, Jeanella Flattery, MD  pantoprazole (PROTONIX) 40 MG tablet Take 1 tablet (40 mg total) by mouth 2 (two) times daily. 11/15/18 11/15/19  Wonda Olds, MD  sucralfate (CARAFATE) 1 GM/10ML suspension Take 10 mLs (1 g total) by mouth 2 (two) times daily for 30 days. Patient taking differently: Take 1 g by mouth as needed (nausea and vomiting).  05/30/18 11/24/18  [name], NP  traMADol (ULTRAM) 50 MG tablet Take 1-2 tablets (50-100 mg total) by mouth every 4 (four) hours as needed for moderate pain. 11/09/18   Freddrick March, PA-C     Physical Exam: Vitals:   11/28/18 1439 11/28/18 1654 11/28/18 2030 11/28/18 2100  BP: (!) 166/96 (!) 194/94 (!) 166/89 (!) 172/96  Pulse:  (!) 114 (!) 121 (!) 117  Resp:  19 18 (!) 24  Temp:      TempSrc:      SpO2:  95% 95% 93%    Physical Exam  Constitutional: She is oriented to person, place, and time. She appears well-developed and well-nourished. No distress.  HENT:  Head: Normocephalic.  Slightly dry mucous membranes  Eyes: Right eye exhibits no discharge. Left eye exhibits no discharge.  Neck: Neck supple.  Cardiovascular: Regular rhythm and intact distal pulses.  Tachycardic  Pulmonary/Chest: Effort normal and breath sounds normal. No respiratory distress. She has no wheezes. She has no rales.  Abdominal: Soft. Bowel sounds are normal. She exhibits no distension. There is abdominal tenderness. There is no rebound and no guarding.  Epigastrium tender to palpation  Musculoskeletal:        General: No edema.  Neurological: She is alert and oriented to person, place, and time.  Skin: Skin is warm and dry. She is not diaphoretic.     Labs on Admission: I have personally reviewed following labs and imaging studies  CBC: Recent Labs  Lab 11/23/18 2223 11/24/18 1030 11/25/18 0612 11/26/18 0206 11/28/18 1455  WBC 8.1 9.2 10.1 8.9 10.2  HGB 10.8* 10.6* 10.0* 10.4* 10.7*  HCT 32.5* 32.9* 31.4* 32.9* 33.0*  MCV 91.0 93.2 94.0 94.5 91.9  PLT 733* 725* 669* 659* 0000000*   Basic Metabolic Panel: Recent Labs  Lab 11/24/18 2315 11/25/18 0227 11/25/18 0612 11/26/18 0206 11/28/18 1455  NA 136 137 136 139 135  K 4.6 3.7 4.0 4.7 3.9  CL 112* 112* 109 110 103  CO2 14* 17* 15* 21* 17*  GLUCOSE 226* 152* 160* 89 303*  BUN 11 10 8 9 9   CREATININE 0.76 0.68 0.78 0.81 0.84  CALCIUM 8.2* 8.4* 8.2* 8.7* 8.4*   GFR: Estimated Creatinine Clearance: 72.9 mL/min (by C-G formula based on SCr of 0.84 mg/dL). Liver Function Tests: Recent Labs  Lab 11/23/18 2223  11/25/18 0612 11/28/18 1455  AST 15 15 20   ALT 12 10 14   ALKPHOS 94 77 92  BILITOT 0.8 0.6 0.9  PROT 7.0 6.2* 7.1  ALBUMIN 3.2* 2.6* 3.1*   Recent Labs  Lab 11/23/18 2223 11/28/18 1455  LIPASE 12 19   No results  for input(s): AMMONIA in the last 168 hours. Coagulation Profile: No results for input(s): INR, PROTIME in the last 168 hours. Cardiac Enzymes: No results for input(s): CKTOTAL, CKMB, CKMBINDEX, TROPONINI in the last 168 hours. BNP (last 3 results) No results for input(s): PROBNP in the last 8760 hours. HbA1C: No results for input(s): HGBA1C in the last 72 hours. CBG: Recent Labs  Lab 11/25/18 2326 11/26/18 0833 11/26/18 1130 11/28/18 1454 11/28/18 1652  GLUCAP 128* 98 93 281* 199*   Lipid Profile: No results for input(s): CHOL, HDL, LDLCALC, TRIG, CHOLHDL, LDLDIRECT in the last 72 hours. Thyroid Function Tests: No results for input(s): TSH, T4TOTAL, FREET4, T3FREE, THYROIDAB in the last 72 hours. Anemia Panel: No results for input(s): VITAMINB12, FOLATE, FERRITIN, TIBC, IRON, RETICCTPCT in the last 72 hours. Urine analysis:    Component Value Date/Time   COLORURINE YELLOW 11/28/2018 2039   APPEARANCEUR CLEAR 11/28/2018 2039   LABSPEC 1.016 11/28/2018 2039   PHURINE 5.0 11/28/2018 2039   GLUCOSEU >=500 (A) 11/28/2018 2039   GLUCOSEU NEGATIVE 11/23/2015 0831   HGBUR SMALL (A) 11/28/2018 2039   BILIRUBINUR NEGATIVE 11/28/2018 2039   BILIRUBINUR neg 12/07/2015 0843   KETONESUR 20 (A) 11/28/2018 2039   PROTEINUR NEGATIVE 11/28/2018 2039   UROBILINOGEN 1.0 12/07/2015 0843   UROBILINOGEN 0.2 11/23/2015 0831   NITRITE NEGATIVE 11/28/2018 2039   LEUKOCYTESUR NEGATIVE 11/28/2018 2039    Radiological Exams on Admission: Dg Chest Portable 1 View  Result Date: 11/28/2018 CLINICAL DATA:  Abdominal pain EXAM: PORTABLE CHEST 1 VIEW COMPARISON:  11/24/2018 FINDINGS: Prior CABG. Heart is normal size. Small left pleural effusion with left lower lobe atelectasis  or infiltrate, worsening since prior study. Right lung clear. No acute bony abnormality. IMPRESSION: Small left pleural effusion. Worsening left lower lobe atelectasis or infiltrate. Electronically Signed   By: Rolm Baptise M.D.   On: 11/28/2018 20:06    EKG: Independently reviewed.  Sinus rhythm.  ST abnormality in inferior and lateral leads.  ST abnormality in lateral leads seen on prior tracing from 11/24/2018 as well.  Assessment/Plan Principal Problem:   Abdominal pain Active Problems:   Coronary artery disease   DKA, type 1 (HCC)   Intractable nausea and vomiting   Diarrhea   Epigastric abdominal pain, intractable nausea and vomiting History of insulin-dependent type 1 diabetes on insulin pump Patient endorses epigastric burning sensation.  Suspect GERD/dyspepsia and gastroparesis could also be contributing.  She was prescribed Protonix during her recent hospitalization to be taken along with ibuprofen.  It seems she has not been taking the PPI.  Has epigastric tenderness on exam but no peritoneal signs. Patient was recently admitted for similar symptoms and found to be in DKA.  CT abdomen pelvis done on 11/1 showing no acute intra-abdominal abnormality.  At present, blood glucose 303 on initial labs.  Bicarb 17, anion gap 15.  UA with ketones.  Patient received a 500 cc normal saline bolus and blood glucose improved to 199.   Afebrile and no leukocytosis.  Lipase and LFTs normal.   -Patient is at risk for developing for developing DKA.  Will continue insulin pump and add sliding scale insulin q4 hrs for additional coverage. Appears dehydrated, continues to be tachycardic.  Will give additional fluid bolus and continue IV fluid hydration.  Repeat stat BMP.  If signs of DKA, stop insulin pump and start insulin infusion. -IV Protonix and IV Pepcid for GERD -IV Reglan -IV Zofran as needed for nausea/vomiting -Lactic acid level pending  Diarrhea  Patient reports having several episodes of  nonbloody diarrhea today.  Denies recent antibiotic use.  Afebrile and no leukocytosis.  No episodes of diarrhea since she has been in the ED. 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  ST abnormality in lateral leads seen on prior tracing from 11/24/2018 as well.  Patient is not endorsing chest pain. -Cardiac monitoring -Check troponin level -Continue colchicine and ibuprofen -Continue aspirin, Plavix, metoprolol, Isordil, Lipitor  Abnormal CXR Chest x-ray showing worsening left lower lobe atelectasis or infiltrate. Pneumonia less likely given no fever or leukocytosis.  No signs of respiratory distress. -Check procalcitonin level, SARS-CoV-2 test pending  Depression -Continue Prozac  DVT prophylaxis: Lovenox Code Status: Full code Family Communication: No family available.  Disposition Plan: Anticipate discharge after clinical improvement. Consults called: None Admission status: It is my clinical opinion that referral for OBSERVATION is reasonable and necessary in this patient based on the above information provided. The aforementioned taken together are felt to place the patient at high risk for further clinical deterioration. However it is anticipated that the patient may be medically stable for discharge from the hospital within 24 to 48 hours.  The medical decision making on this patient was of high complexity and the patient is at high risk for clinical deterioration, therefore this is a level 3 visit.  Shela Leff MD [facility] Pager [phone]  If 7PM-7AM, please contact night-coverage [email or web address] Password [email or web address]  11/28/2018, 10:11 PM

## 2018-11-28 NOTE — ED Provider Notes (Signed)
[facility] EMERGENCY DEPARTMENT Provider Note   CSN: [id number] Arrival date & time: 11/28/18  1434     History   Chief Complaint Chief Complaint  Patient presents with   Abdominal Pain    HPI Jennifer Hogan is a 48 y.o. female history CAD, diabetes/DKA, cardiomyopathy, CHF, gastroparesis.  Patient reports that since discharge on 11/26/2018 she was feeling well and taking her home medications ibuprofen and colchicine as prescribed by her cardiothoracic surgeon.  Patient reports that this morning she woke up with severe abdominal pain a burning sensation in her periumbilical and epigastric regions nonradiating constant without clear aggravating or alleviating factors.  She has not attempted any medications for her symptoms prior to arrival.  She reports multiple episodes of nonbloody, nonbilious emesis and a few episodes of nonbloody diarrhea.  She reports that these are the same symptoms that brought her to the emergency department on 11/23/2018 she is not sure what caused her symptoms.  Patient was given 4 mg Zofran on ED arrival but reports has not helped.  Reports feeling warm but has not measured a fever, denies headache/vision changes, neck pain, chest pain/shortness of breath, rash, dysuria/hematuria or additional concerns. ----- Admission 11/23/2018-11/26/2018 Patient presented with a chief complaint of abdominal pain, CT abdomen/pelvis was negative, she was admitted to the hospitalist service, per discharge note it appears patient developed worsening ketoacidosis shortly after admission.  It appears patient was started on colchicine and Toradol by her cardiothoracic surgeon, she is discharged on colchicine and NSAIDs.  Admission 10/29/2018-11/09/2018 Admitted for NSTEMI underwent CABG x3    HPI  Past Medical History:  Diagnosis Date   Anemia    Anxiety    Coronary artery disease    a. s/pPTCA DES x3 in the LAD (ostial DES placed, mid DES placed,  distal DES placed)and PTCA/DES x1to intermediate branch 02/2017. b. ACS 02/08/18 with LCx stent placement.   Depression    Diabetes mellitus    type 1, on insulin pump   DKA (diabetic ketoacidoses) ([place]) 12/31/2017   Elevated platelet count    High cholesterol    Hypothyroidism    Ischemic cardiomyopathy    a. EF low-normal with basal inferior akinesis, basal septal hypokinesis by echo 01/2018.    Patient Active Problem List   Diagnosis Date Noted   Abdominal pain 11/28/2018   DKA (diabetic ketoacidoses) ([place]) 11/25/2018   Sinus tachycardia [id number]   Chronic systolic heart failure ([place])    Cardiomyopathy (HCC)    Hypotension 10/29/2018   DKA, type 1 ([place]) 05/24/2018   Moderate episode of recurrent major depressive disorder ([place]) 03/27/2018   Epigastric pain    Hyperglycemia due to type 1 diabetes mellitus ([place]) 02/14/2018   Hyperlipidemia LDL goal <70 02/14/2018   S/P drug eluting coronary stent placement    ACS (acute coronary syndrome) ([place]) 02/08/2018   ST elevation myocardial infarction (STEMI) ([place]) 02/08/2018   DKA (diabetic ketoacidosis) ([place]) 12/31/2017   Marijuana abuse 12/31/2017   Gastroparesis 12/06/2017   Gastroparesis due to DM ([place]) 03/28/2017   Coronary artery disease 03/28/2017   Ischemic cardiomyopathy 03/28/2017   Pure hypercholesterolemia    NSTEMI (non-ST elevated myocardial infarction) ([place])    Anemia 03/17/2017   Allergic state 08/07/2015   Vitiligo 07/09/2012   Type 1 diabetes mellitus ([place]) 07/09/2012   Depression 12/04/2010   Non-intractable vomiting with nausea 12/04/2010   Hypothyroidism 12/04/2010    Past Surgical History:  Procedure Laterality Date   APPENDECTOMY  CORONARY ARTERY BYPASS GRAFT N/A 11/04/2018   Procedure: CORONARY ARTERY BYPASS GRAFTING (CABG), ON PUMP, TIMES THREE, USING LEFT AND RIGHT INTERNAL MAMMARY ARTERIES;  Surgeon: Wonda Olds, MD;  Location: [facility];  Service: Open  Heart Surgery;  Laterality: N/A;   CORONARY STENT INTERVENTION N/A 03/20/2017   Procedure: DES x 3 LAD, DES OM; Surgeon: Burnell Blanks, MD;  Location: [facility];  Service: Cardiovascular;  Laterality: N/A;   CORONARY/GRAFT ACUTE MI REVASCULARIZATION N/A 02/08/2018   Procedure: Coronary/Graft Acute MI Revascularization;  Surgeon: Belva Crome, MD;  Location: [facility];  Service: Cardiovascular;  Laterality: N/A;   LEFT HEART CATH AND CORONARY ANGIOGRAPHY N/A 03/20/2017   Procedure: LEFT HEART CATH AND CORONARY ANGIOGRAPHY;  Surgeon: Burnell Blanks, MD;  Location: [facility];  Service: Cardiovascular;  Laterality: N/A;   LEFT HEART CATH AND CORONARY ANGIOGRAPHY N/A 02/08/2018   Procedure: LEFT HEART CATH AND CORONARY ANGIOGRAPHY;  Surgeon: Belva Crome, MD;  Location: [facility];  Service: Cardiovascular;  Laterality: N/A;   LEFT HEART CATH AND CORONARY ANGIOGRAPHY N/A 10/29/2018   Procedure: LEFT HEART CATH AND CORONARY ANGIOGRAPHY;  Surgeon: Burnell Blanks, MD;  Location: [facility];  Service: Cardiovascular;  Laterality: N/A;   TEE WITHOUT CARDIOVERSION N/A 11/04/2018   Procedure: TRANSESOPHAGEAL ECHOCARDIOGRAM (TEE);  Surgeon: Wonda Olds, MD;  Location: [facility];  Service: Open Heart Surgery;  Laterality: N/A;     OB History    Gravida  0   Para  0   Term  0   Preterm  0   AB  0   Living  0     SAB  0   TAB  0   Ectopic  0   Multiple  0   Live Births  0            Home Medications    Prior to Admission medications   Medication Sig Start Date End Date Taking? Authorizing Provider  acetaminophen (TYLENOL) 325 MG tablet Take 650 mg by mouth every 6 (six) hours as needed for mild pain or moderate pain.    [provider]  albuterol (PROVENTIL HFA;VENTOLIN HFA) 108 (90 Base) MCG/ACT inhaler Inhale 2 puffs into the lungs every 6 (six) hours as needed for wheezing or shortness of  breath. 12/07/17 11/28/18  Damita Lack, MD  aspirin 81 MG EC tablet Take 1 tablet (81 mg total) by mouth daily. 12/07/17   Amin, Jeanella Flattery, MD  atorvastatin (LIPITOR) 80 MG tablet Take 1 tablet (80 mg total) by mouth daily at 6 PM. 11/09/18   Barrett, Erin R, PA-C  clopidogrel (PLAVIX) 75 MG tablet Take 1 tablet (75 mg total) by mouth daily with breakfast. Please keep upcoming appt in November for future refills. Thank you 11/12/18   [name], MD  colchicine 0.6 MG tablet Take 1 tablet (0.6 mg total) by mouth 2 (two) times daily. 11/26/18   Kathie Dike, MD  FLUoxetine (PROZAC) 40 MG capsule Take 1 capsule (40 mg total) by mouth daily. 11/15/18 11/15/19  Wonda Olds, MD  furosemide (LASIX) 20 MG tablet Take 1 tablet (20 mg total) by mouth daily. 11/09/18   Barrett, Erin R, PA-C  ibuprofen (ADVIL) 400 MG tablet Take 2 tablets (800 mg total) by mouth 3 (three) times daily. 11/26/18   Kathie Dike, MD  insulin aspart (NOVOLOG) 100 UNIT/ML injection 35 units per insulin pump daily Patient  taking differently: Inject 35 Units into the skin daily. insulin pump daily 10/31/17   Renato Shin, MD  ipratropium (ATROVENT) 0.06 % nasal spray Place 2 sprays into both nostrils 4 (four) times daily. Patient taking differently: Place 2 sprays into both nostrils 2 (two) times a day.  02/27/18   Tasia Catchings, Amy V, PA-C  isosorbide dinitrate (ISORDIL) 5 MG tablet Take 1 tablet (5 mg total) by mouth 3 (three) times daily. 11/09/18   Barrett, Lodema Hong, PA-C  levothyroxine (EUTHYROX) 100 MCG tablet Take 1 tablet (100 mcg total) by mouth daily before breakfast. Must have office visit for refills 10/23/18   Charlott Rakes, MD  metoprolol tartrate (LOPRESSOR) 25 MG tablet Take 0.5 tablets (12.5 mg total) by mouth 2 (two) times daily. Hold for SBP<90 and HR<50 11/09/18   Barrett, Erin R, PA-C  montelukast (SINGULAIR) 10 MG tablet TAKE 1 TABLET BY MOUTH AT BEDTIME Patient taking differently: Take 10 mg by mouth at  bedtime.  09/16/18   [name], NP  nitroGLYCERIN (NITROSTAT) 0.4 MG SL tablet Place 1 tablet (0.4 mg total) under the tongue every 5 (five) minutes as needed for chest pain. 12/07/17   Amin, Jeanella Flattery, MD  pantoprazole (PROTONIX) 40 MG tablet Take 1 tablet (40 mg total) by mouth 2 (two) times daily. 11/15/18 11/15/19  Wonda Olds, MD  sucralfate (CARAFATE) 1 GM/10ML suspension Take 10 mLs (1 g total) by mouth 2 (two) times daily for 30 days. Patient taking differently: Take 1 g by mouth as needed (nausea and vomiting).  05/30/18 11/28/18  [name], NP  traMADol (ULTRAM) 50 MG tablet Take 1-2 tablets (50-100 mg total) by mouth every 4 (four) hours as needed for moderate pain. 11/09/18   Barrett, Lodema Hong, PA-C    Family History Family History  Problem Relation Age of Onset   Coronary artery disease Father    Congestive Heart Failure Father    Asthma Father    Cancer Mother        T cell lymphoma    Social History Social History   Tobacco Use   Smoking status: Never Smoker   Smokeless tobacco: Never Used   Tobacco comment: Use marijuana  Substance Use Topics   Alcohol use: No    Alcohol/week: 0.0 standard drinks   Drug use: Yes    Types: Marijuana    Comment: last use last week, occaisonal marijuana use     Allergies   Crestor [rosuvastatin calcium], Peanut-containing drug products, Ciprofloxin hcl [ciprofloxacin], and Food color red [red dye]   Review of Systems Review of Systems Ten systems are reviewed and are negative for acute change except as noted in the HPI   Physical Exam Updated Vital Signs BP (!) 172/96    Pulse (!) 117    Temp 98.4 F (36.9 C) (Oral)    Resp (!) 24    LMP 11/12/2018    SpO2 93%   Physical Exam Constitutional:      General: She is not in acute distress.    Appearance: Normal appearance. She is well-developed. She is ill-appearing. She is not toxic-appearing or diaphoretic.     Comments: Uncomfortable appearing    HENT:     Head: Normocephalic and atraumatic.     Right Ear: External ear normal.     Left Ear: External ear normal.     Nose: Nose normal.  Eyes:     General: Vision grossly intact. Gaze aligned appropriately.     Pupils:  Pupils are equal, round, and reactive to light.  Neck:     Musculoskeletal: Normal range of motion.     Trachea: Trachea and phonation normal. No tracheal deviation.  Cardiovascular:     Rate and Rhythm: Normal rate and regular rhythm.  Pulmonary:     Effort: Pulmonary effort is normal. No respiratory distress.  Chest:     Comments: Midline incision well-healing without signs of infection. Abdominal:     General: There is no distension.     Palpations: Abdomen is soft.     Tenderness: There is abdominal tenderness in the epigastric area and periumbilical area. There is no guarding or rebound.     Comments: Multiple surgical incision scars without sign of infection.  Musculoskeletal: Normal range of motion.  Skin:    General: Skin is warm and dry.  Neurological:     Mental Status: She is alert.     GCS: GCS eye subscore is 4. GCS verbal subscore is 5. GCS motor subscore is 6.     Comments: Speech is clear and goal oriented, follows commands Major Cranial nerves without deficit, no facial droop Moves extremities without ataxia, coordination intact  Psychiatric:        Behavior: Behavior normal.    ED Treatments / Results  Labs (all labs ordered are listed, but only abnormal results are displayed) Labs Reviewed  COMPREHENSIVE METABOLIC PANEL - Abnormal; Notable for the following components:      Result Value   CO2 17 (*)    Glucose, Bld 303 (*)    Calcium 8.4 (*)    Albumin 3.1 (*)    All other components within normal limits  CBC - Abnormal; Notable for the following components:   RBC 3.59 (*)    Hemoglobin 10.7 (*)    HCT 33.0 (*)    Platelets 593 (*)    All other components within normal limits  URINALYSIS, ROUTINE W REFLEX MICROSCOPIC -  Abnormal; Notable for the following components:   Glucose, UA >=500 (*)    Hgb urine dipstick SMALL (*)    Ketones, ur 20 (*)    All other components within normal limits  CBG MONITORING, ED - Abnormal; Notable for the following components:   Glucose-Capillary 281 (*)    All other components within normal limits  CBG MONITORING, ED - Abnormal; Notable for the following components:   Glucose-Capillary 199 (*)    All other components within normal limits  SARS CORONAVIRUS 2 (TAT 6-24 HRS)  LIPASE, BLOOD  LACTIC ACID, PLASMA  LACTIC ACID, PLASMA  BASIC METABOLIC PANEL  I-STAT BETA HCG BLOOD, ED ([facility], [facility], [facility] ONLY)    EKG EKG Interpretation  Date/Time:  Thursday November 28 2018 16:50:51 EST Ventricular Rate:  121 PR Interval:    QRS Duration: 80 QT Interval:  331 QTC Calculation: 470 R Axis:   93 Text Interpretation: Sinus tachycardia Consider right atrial enlargement Borderline right axis deviation Abnormal T, consider ischemia, diffuse leads Although rate has increased No significant change since last tracing Confirmed by Quintella Reichert [id number]) on 11/28/2018 5:58:28 PM   Radiology Dg Chest Portable 1 View  Result Date: 11/28/2018 CLINICAL DATA:  Abdominal pain EXAM: PORTABLE CHEST 1 VIEW COMPARISON:  11/24/2018 FINDINGS: Prior CABG. Heart is normal size. Small left pleural effusion with left lower lobe atelectasis or infiltrate, worsening since prior study. Right lung clear. No acute bony abnormality. IMPRESSION: Small left pleural effusion. Worsening left lower lobe atelectasis or infiltrate. Electronically Signed   By:  Rolm Baptise M.D.   On: 11/28/2018 20:06    Procedures Procedures (including critical care time)  Medications Ordered in ED Medications  sodium chloride flush (NS) 0.9 % injection 3 mL (3 mLs Intravenous Given 11/28/18 2049)  ondansetron (ZOFRAN) injection 4 mg (4 mg Intravenous Given 11/28/18 1538)  sodium chloride 0.9 % bolus 500 mL (0 mLs Intravenous  Stopped 11/28/18 2040)  promethazine (PHENERGAN) injection 25 mg (25 mg Intramuscular Given 11/28/18 1811)  LORazepam (ATIVAN) injection 1 mg (1 mg Intravenous Given 11/28/18 2048)     Initial Impression / Assessment and Plan / ED Course  I have reviewed the triage vital signs and the nursing notes.  Pertinent labs & imaging results that were available during my care of the patient were reviewed by me and considered in my medical decision making (see chart for details).  Clinical Course as of Nov 28 2106  Thu Nov 28, 2018  2046 Hospitalist   [BM]    Clinical Course User Index [BM] Gari Crown   Initial CBG 281 Repeat CBG 199 Lipase within normal limits CBC nonacute UDS negative CBG with glucose 303, bicarb 17, anion gap 15 CXR: IMPRESSION: Small left pleural effusion. Worsening left lower lobe atelectasis or infiltrate.  EKG Sinus tachycardia Consider right atrial enlargement Borderline right axis deviation Abnormal T, consider ischemia, diffuse leads Although rate has increased No significant change since last tracing Confirmed by Quintella Reichert [phone]) on 11/28/2018 5:58:28 PM  Urinalysis with greater than 500 glucose, 20 ketones, small hemoglobin - Patient symptoms improved following intramuscular Phenergan.  On reassessment she is resting comfortably no acute distress remains tachycardic, she reports she is feeling improved.  Concern as patient with elevated glucose, decreased bicarb, ketonuria and anion gap of 15 that she may be heading towards DKA in the setting of gastroparesis.  Patient with recent admission for similar symptoms at which time she had CT abdomen pelvis which was negative for acute findings.  Today her abdomen is without focal tenderness or peritoneal signs, do not feel patient necessitates additional CT imaging, suspect secondary to gastroparesis today.  She has been given 500 mL fluid bolus however will want to be conservative with fluids as she does  have history of CHF.  We will seek admission to hospitalist service for observation admission, she does have what looks to be worsening atelectasis versus infiltrate in her left lower lobe.  She has no history of fever, cough, will order COVID-19 test and defer antibiotic to hospitalist service. - Discussed case with Dr. Marlowe Sax from hospitalist service will be seeing patient for admission. - Patient has been admitted to hospital service for further evaluation and management.  Patient was seen and evaluated by Dr. Ralene Bathe during this visit who agrees with work-up and admission.  Layne A Hou was evaluated in Emergency Department on 11/28/2018 for the symptoms described in the history of present illness. She was evaluated in the context of the global COVID-19 pandemic, which necessitated consideration that the patient might be at risk for infection with the SARS-CoV-2 virus that causes COVID-19. Institutional protocols and algorithms that pertain to the evaluation of patients at risk for COVID-19 are in a state of rapid change based on information released by regulatory bodies including the CDC and federal and state organizations. These policies and algorithms were followed during the patient's care in the ED.  Note: Portions of this report may have been transcribed using voice recognition software. Every effort was made to ensure accuracy; however,  inadvertent computerized transcription errors may still be present. Final Clinical Impressions(s) / ED Diagnoses   Final diagnoses:  Gastroparesis  Abdominal pain, unspecified abdominal location  Hyperglycemia    ED Discharge Orders    None       Gari Crown 11/28/18 2108    Quintella Reichert, MD 11/28/18 2252

## 2018-11-28 NOTE — Telephone Encounter (Signed)
Transition Care Management Follow-up Telephone Call - attempt #2 Date of discharge and from where:  11/26/2018, [name]    Call placed to # [phone], message left requesting a call back to this CM # [phone]

## 2018-11-29 DIAGNOSIS — R739 Hyperglycemia, unspecified: Secondary | ICD-10-CM

## 2018-11-29 DIAGNOSIS — R1013 Epigastric pain: Secondary | ICD-10-CM

## 2018-11-29 DIAGNOSIS — E1059 Type 1 diabetes mellitus with other circulatory complications: Secondary | ICD-10-CM

## 2018-11-29 LAB — BASIC METABOLIC PANEL
Anion gap: 10 (ref 5–15)
BUN: 9 mg/dL (ref 6–20)
CO2: 20 mmol/L — ABNORMAL LOW (ref 22–32)
Calcium: 8.3 mg/dL — ABNORMAL LOW (ref 8.9–10.3)
Chloride: 106 mmol/L (ref 98–111)
Creatinine, Ser: 0.73 mg/dL (ref 0.44–1.00)
GFR calc Af Amer: >60 mL/min (ref >60)
GFR calc non Af Amer: >60 mL/min (ref >60)
Glucose, Bld: 247 mg/dL — ABNORMAL HIGH (ref 70–99)
Potassium: 4.8 mmol/L (ref 3.5–5.1)
Sodium: 136 mmol/L (ref 135–145)

## 2018-11-29 LAB — GLUCOSE, CAPILLARY
Glucose-Capillary: 173 mg/dL — ABNORMAL HIGH (ref 70–99)
Glucose-Capillary: 161 mg/dL — ABNORMAL HIGH (ref 70–99)
Glucose-Capillary: 167 mg/dL — ABNORMAL HIGH (ref 70–99)
Glucose-Capillary: 194 mg/dL — ABNORMAL HIGH (ref 70–99)

## 2018-11-29 LAB — SARS CORONAVIRUS 2 (TAT 6-24 HRS): SARS Coronavirus 2: NEGATIVE

## 2018-11-29 LAB — CBG MONITORING, ED
Glucose-Capillary: 216 mg/dL — ABNORMAL HIGH (ref 70–99)
Glucose-Capillary: 217 mg/dL — ABNORMAL HIGH (ref 70–99)
Glucose-Capillary: 219 mg/dL — ABNORMAL HIGH (ref 70–99)

## 2018-11-29 LAB — CULTURE, BLOOD (ROUTINE X 2)
Culture: NO GROWTH
Culture: NO GROWTH
Special Requests: ADEQUATE

## 2018-11-29 LAB — TROPONIN I (HIGH SENSITIVITY): Troponin I (High Sensitivity): 13 ng/L (ref <18)

## 2018-11-29 LAB — PROCALCITONIN: Procalcitonin: <0.1 ng/mL

## 2018-11-29 LAB — LACTIC ACID, PLASMA: Lactic Acid, Venous: 1.3 mmol/L (ref 0.5–1.9)

## 2018-11-29 MED ORDER — INSULIN PUMP
SUBCUTANEOUS | Status: DC
  Administered 2018-11-29: 21:00:00 0.2 via SUBCUTANEOUS
  Administered 2018-11-29 – 2018-12-01 (×7): via SUBCUTANEOUS
  Administered 2018-12-01 (×2): 0.8 via SUBCUTANEOUS
  Administered 2018-12-01: 1.4 via SUBCUTANEOUS
  Administered 2018-12-01 – 2018-12-02 (×5): via SUBCUTANEOUS
  Filled 2018-11-29: qty 1

## 2018-11-29 NOTE — ED Notes (Signed)
ED TO INPATIENT HANDOFF REPORT  ED Nurse Name and Phone #: .  S Name/Age/Gender Jennifer Hogan 48 y.o. female Room/Bed: 038C/038C  Code Status   Code Status: Full Code  Home/SNF/Other Home Patient oriented to: self, place, time and situation Is this baseline? Yes   Triage Complete: Triage complete  Chief Complaint abd pain  Triage Note Pt arrives via EMS from home with reports of abd pain and nausea. CBG 332 100 mcg fentanyl and 4 mg zofran given by EMS.   Allergies Allergies  Allergen Reactions  . Crestor [Rosuvastatin Calcium] Other (See Comments)    Severe myalgias and joint pain. Has tolerated atorvastatin though  . Peanut-Containing Drug Products Other (See Comments)    Vomiting, upset stomach and some wheezing  . Ciprofloxin Hcl [Ciprofloxacin] Nausea And Vomiting and Other (See Comments)    Severe migraine  . Food Color Red [Red Dye] Diarrhea    Level of Care/Admitting Diagnosis ED Disposition    ED Disposition Condition Comment   Admit  Hospital Area: [facility] [[id number]]  Level of Care: Telemetry Medical [104]  I expect the patient will be discharged within 24 hours: Yes  LOW acuity---Tx typically complete <24 hrs---ACUTE conditions typically can be evaluated <24 hours---LABS likely to return to acceptable levels <24 hours---IS near functional baseline---EXPECTED to return to current living arrangement---NOT newly hypoxic: Meets criteria for 5C-Observation unit  Covid Evaluation: Asymptomatic Screening Protocol (No Symptoms)  Diagnosis: Abdominal pain [id number]  Admitting Physician: Shela Leff [id number]  Attending Physician: Shela Leff [id number]  PT Class (Do Not Modify): Observation [104]  PT Acc Code (Do Not Modify): Observation [10022]       B Medical/Surgery History Past Medical History:  Diagnosis Date  . Anemia   . Anxiety   . Coronary artery disease    a. s/pPTCA DES x3 in the LAD (ostial DES placed,  mid DES placed, distal DES placed)and PTCA/DES x1to intermediate branch 02/2017. b. ACS 02/08/18 with LCx stent placement.  . Depression   . Diabetes mellitus    type 1, on insulin pump  . DKA (diabetic ketoacidoses) ([place]) 12/31/2017  . Elevated platelet count   . High cholesterol   . Hypothyroidism   . Ischemic cardiomyopathy    a. EF low-normal with basal inferior akinesis, basal septal hypokinesis by echo 01/2018.   Past Surgical History:  Procedure Laterality Date  . APPENDECTOMY    . CORONARY ARTERY BYPASS GRAFT N/A 11/04/2018   Procedure: CORONARY ARTERY BYPASS GRAFTING (CABG), ON PUMP, TIMES THREE, USING LEFT AND RIGHT INTERNAL MAMMARY ARTERIES;  Surgeon: Wonda Olds, MD;  Location: [facility];  Service: Open Heart Surgery;  Laterality: N/A;  . CORONARY STENT INTERVENTION N/A 03/20/2017   Procedure: DES x 3 LAD, DES OM; Surgeon: Burnell Blanks, MD;  Location: [facility];  Service: Cardiovascular;  Laterality: N/A;  . CORONARY/GRAFT ACUTE MI REVASCULARIZATION N/A 02/08/2018   Procedure: Coronary/Graft Acute MI Revascularization;  Surgeon: Belva Crome, MD;  Location: [facility];  Service: Cardiovascular;  Laterality: N/A;  . LEFT HEART CATH AND CORONARY ANGIOGRAPHY N/A 03/20/2017   Procedure: LEFT HEART CATH AND CORONARY ANGIOGRAPHY;  Surgeon: Burnell Blanks, MD;  Location: [facility];  Service: Cardiovascular;  Laterality: N/A;  . LEFT HEART CATH AND CORONARY ANGIOGRAPHY N/A 02/08/2018   Procedure: LEFT HEART CATH AND CORONARY ANGIOGRAPHY;  Surgeon: Belva Crome, MD;  Location: [facility];  Service: Cardiovascular;  Laterality: N/A;  .  LEFT HEART CATH AND CORONARY ANGIOGRAPHY N/A 10/29/2018   Procedure: LEFT HEART CATH AND CORONARY ANGIOGRAPHY;  Surgeon: Burnell Blanks, MD;  Location: [facility];  Service: Cardiovascular;  Laterality: N/A;  . TEE WITHOUT CARDIOVERSION N/A 11/04/2018   Procedure: TRANSESOPHAGEAL  ECHOCARDIOGRAM (TEE);  Surgeon: Wonda Olds, MD;  Location: [facility];  Service: Open Heart Surgery;  Laterality: N/A;     A IV Location/Drains/Wounds Patient Lines/Drains/Airways Status   Active Line/Drains/Airways    Name:   Placement date:   Placement time:   Site:   Days:   Peripheral IV 11/28/18 Left Antecubital   11/28/18    -    Antecubital   1   Incision (Closed) 11/04/18 Chest Other (Comment)   11/04/18    1132     25          Intake/Output Last 24 hours  Intake/Output Summary (Last 24 hours) at 11/29/2018 0811 Last data filed at 11/29/2018 0602 Gross per 24 hour  Intake 550 ml  Output -  Net 550 ml    Labs/Imaging Results for orders placed or performed during the hospital encounter of 11/28/18 (from the past 48 hour(s))  CBG monitoring, ED     Status: Abnormal   Collection Time: 11/28/18  2:54 PM  Result Value Ref Range   Glucose-Capillary 281 (H) 70 - 99 mg/dL  Lipase, blood     Status: None   Collection Time: 11/28/18  2:55 PM  Result Value Ref Range   Lipase 19 11 - 51 U/L    Comment: Performed at [facility], [place] [address], [place], [place] [address]  Comprehensive metabolic panel     Status: Abnormal   Collection Time: 11/28/18  2:55 PM  Result Value Ref Range   Sodium 135 135 - 145 mmol/L   Potassium 3.9 3.5 - 5.1 mmol/L   Chloride 103 98 - 111 mmol/L   CO2 17 (L) 22 - 32 mmol/L   Glucose, Bld 303 (H) 70 - 99 mg/dL   BUN 9 6 - 20 mg/dL   Creatinine, Ser 0.84 0.44 - 1.00 mg/dL   Calcium 8.4 (L) 8.9 - 10.3 mg/dL   Total Protein 7.1 6.5 - 8.1 g/dL   Albumin 3.1 (L) 3.5 - 5.0 g/dL   AST 20 15 - 41 U/L   ALT 14 0 - 44 U/L   Alkaline Phosphatase 92 38 - 126 U/L   Total Bilirubin 0.9 0.3 - 1.2 mg/dL   GFR calc non Af Amer >60 >60 mL/min   GFR calc Af Amer >60 >60 mL/min   Anion gap 15 5 - 15    Comment: Performed at [facility] [address], [place],  [address]  CBC     Status: Abnormal   Collection Time: 11/28/18  2:55 PM   Result Value Ref Range   WBC 10.2 4.0 - 10.5 K/uL   RBC 3.59 (L) 3.87 - 5.11 MIL/uL   Hemoglobin 10.7 (L) 12.0 - 15.0 g/dL   HCT 33.0 (L) 36.0 - 46.0 %   MCV 91.9 80.0 - 100.0 fL   MCH 29.8 26.0 - 34.0 pg   MCHC 32.4 30.0 - 36.0 g/dL   RDW 14.8 11.5 - 15.5 %   Platelets 593 (H) 150 - 400 K/uL   nRBC 0.0 0.0 - 0.2 %    Comment: Performed at [facility], [place] [address], [place], [place] [address]  I-Stat beta hCG blood, ED  Status: None   Collection Time: 11/28/18  3:01 PM  Result Value Ref Range   I-stat hCG, quantitative <5.0 <5 mIU/mL   Comment 3            Comment:   GEST. AGE      CONC.  (mIU/mL)   <=1 WEEK        5 - 50     2 WEEKS       50 - 500     3 WEEKS       100 - 10,000     4 WEEKS     1,000 - 30,000        FEMALE AND NON-PREGNANT FEMALE:     LESS THAN 5 mIU/mL   CBG monitoring, ED     Status: Abnormal   Collection Time: 11/28/18  4:52 PM  Result Value Ref Range   Glucose-Capillary 199 (H) 70 - 99 mg/dL   Comment 1 Notify RN    Comment 2 Document in Chart   Urinalysis, Routine w reflex microscopic     Status: Abnormal   Collection Time: 11/28/18  8:39 PM  Result Value Ref Range   Color, Urine YELLOW YELLOW   APPearance CLEAR CLEAR   Specific Gravity, Urine 1.016 1.005 - 1.030   pH 5.0 5.0 - 8.0   Glucose, UA >=500 (A) NEGATIVE mg/dL   Hgb urine dipstick SMALL (A) NEGATIVE   Bilirubin Urine NEGATIVE NEGATIVE   Ketones, ur 20 (A) NEGATIVE mg/dL   Protein, ur NEGATIVE NEGATIVE mg/dL   Nitrite NEGATIVE NEGATIVE   Leukocytes,Ua NEGATIVE NEGATIVE   RBC / HPF 0-5 0 - 5 RBC/hpf   WBC, UA 0-5 0 - 5 WBC/hpf   Bacteria, UA NONE SEEN NONE SEEN   Squamous Epithelial / LPF 0-5 0 - 5    Comment: Performed at [facility], [place] [address] [place], [place] [address]  SARS CORONAVIRUS 2 (TAT 6-24 HRS) Nasopharyngeal     Status: None   Collection Time: 11/28/18  8:39 PM   Specimen: Nasopharyngeal  Result Value Ref Range   SARS Coronavirus 2 NEGATIVE  NEGATIVE    Comment: (NOTE) SARS-CoV-2 target nucleic acids are NOT DETECTED. The SARS-CoV-2 RNA is generally detectable in upper and lower respiratory specimens during the acute phase of infection. Negative results do not preclude SARS-CoV-2 infection, do not rule out co-infections with other pathogens, and should not be used as the sole basis for treatment or other patient management decisions. Negative results must be combined with clinical observations, patient history, and epidemiological information. The expected result is Negative. Fact Sheet for Patients: SugarRoll.be Fact Sheet for Healthcare Providers: https://www.woods-mathews.com/ This test is not yet approved or cleared by the [place] FDA and  has been authorized for detection and/or diagnosis of SARS-CoV-2 by FDA under an Emergency Use Authorization (EUA). This EUA will remain  in effect (meaning this test can be used) for the duration of the COVID-19 declaration under Section 56 4(b)(1) of the Act, 21 U.S.C. section 360bbb-3(b)(1), unless the authorization is terminated or revoked sooner. Performed at [facility], [place] [address] [place], [place] [address]   Lactic acid, plasma     Status: None   Collection Time: 11/28/18  8:39 PM  Result Value Ref Range   Lactic Acid, Venous 1.5 0.5 - 1.9 mmol/L    Comment: Performed at [facility] [address] [place], [place] [address]  CBG monitoring, ED  Status: Abnormal   Collection Time: 11/29/18 12:18 AM  Result Value Ref Range   Glucose-Capillary 219 (H) 70 - 99 mg/dL   Comment 1 Document in Chart   Lactic acid, plasma     Status: None   Collection Time: 11/29/18 12:22 AM  Result Value Ref Range   Lactic Acid, Venous 1.3 0.5 - 1.9 mmol/L    Comment: Performed at [facility], [place] [address] [place], [place] [id number]  Basic metabolic panel     Status: Abnormal   Collection Time: 11/29/18 12:22  AM  Result Value Ref Range   Sodium 136 135 - 145 mmol/L   Potassium 4.8 3.5 - 5.1 mmol/L   Chloride 106 98 - 111 mmol/L   CO2 20 (L) 22 - 32 mmol/L   Glucose, Bld 247 (H) 70 - 99 mg/dL   BUN 9 6 - 20 mg/dL   Creatinine, Ser 0.73 0.44 - 1.00 mg/dL   Calcium 8.3 (L) 8.9 - 10.3 mg/dL   GFR calc non Af Amer >60 >60 mL/min   GFR calc Af Amer >60 >60 mL/min   Anion gap 10 5 - 15    Comment: Performed at [facility], [place] [address], [place], [place] [address]  Procalcitonin - Baseline     Status: None   Collection Time: 11/29/18 12:22 AM  Result Value Ref Range   Procalcitonin <0.10 ng/mL    Comment:        Interpretation: PCT (Procalcitonin) <= 0.5 ng/mL: Systemic infection (sepsis) is not likely. Local bacterial infection is possible. (NOTE)       Sepsis PCT Algorithm           Lower Respiratory Tract                                      Infection PCT Algorithm    ----------------------------     ----------------------------         PCT < 0.25 ng/mL                PCT < 0.10 ng/mL         Strongly encourage             Strongly discourage   discontinuation of antibiotics    initiation of antibiotics    ----------------------------     -----------------------------       PCT 0.25 - 0.50 ng/mL            PCT 0.10 - 0.25 ng/mL               OR       >80% decrease in PCT            Discourage initiation of                                            antibiotics      Encourage discontinuation           of antibiotics    ----------------------------     -----------------------------         PCT >= 0.50 ng/mL              PCT 0.26 - 0.50 ng/mL  AND        <80% decrease in PCT             Encourage initiation of                                             antibiotics       Encourage continuation           of antibiotics    ----------------------------     -----------------------------        PCT >= 0.50 ng/mL                  PCT > 0.50 ng/mL               AND          increase in PCT                  Strongly encourage                                      initiation of antibiotics    Strongly encourage escalation           of antibiotics                                     -----------------------------                                           PCT <= 0.25 ng/mL                                                 OR                                        > 80% decrease in PCT                                     Discontinue / Do not initiate                                             antibiotics Performed at [facility], [place] [address], [place], [place] [address]   CBG monitoring, ED     Status: Abnormal   Collection Time: 11/29/18  1:22 AM  Result Value Ref Range   Glucose-Capillary 216 (H) 70 - 99 mg/dL   Comment 1 Document in Chart   CBG monitoring, ED     Status: Abnormal   Collection Time: 11/29/18  4:29 AM  Result Value Ref Range   Glucose-Capillary 217 (H) 70 - 99 mg/dL  Troponin I (High Sensitivity)     Status: None   Collection Time:  11/29/18  7:01 AM  Result Value Ref Range   Troponin I (High Sensitivity) 13 <18 ng/L    Comment: (NOTE) Elevated high sensitivity troponin I (hsTnI) values and significant  changes across serial measurements may suggest ACS but many other  chronic and acute conditions are known to elevate hsTnI results.  Refer to the "Links" section for chest pain algorithms and additional  guidance. Performed at [facility], [place] [address] [place], [place] [address]    Dg Chest Portable 1 View  Result Date: 11/28/2018 CLINICAL DATA:  Abdominal pain EXAM: PORTABLE CHEST 1 VIEW COMPARISON:  11/24/2018 FINDINGS: Prior CABG. Heart is normal size. Small left pleural effusion with left lower lobe atelectasis or infiltrate, worsening since prior study. Right lung clear. No acute bony abnormality. IMPRESSION: Small left pleural effusion. Worsening left lower lobe atelectasis or infiltrate. Electronically Signed    By: Rolm Baptise M.D.   On: 11/28/2018 20:06    Pending Labs Unresulted Labs (From admission, onward)    Start     Ordered   11/28/18 2207  Gastrointestinal Panel by PCR , Stool  (Gastrointestinal Panel by PCR, Stool                                                                                                                                                     *Does Not include CLOSTRIDIUM DIFFICILE testing.**If CDIFF testing is needed, select the C Difficile Quick Screen w PCR reflex order below)  ONCE - STAT,   STAT     11/28/18 2206          Vitals/Pain Today's Vitals   11/29/18 0500 11/29/18 0600 11/29/18 0651 11/29/18 0700  BP: 115/70 112/70  114/71  Pulse: 89 87  89  Resp: 18 16  17   Temp:      TempSrc:      SpO2: 96% 94%  95%  PainSc:   0-No pain     Isolation Precautions Enteric precautions (UV disinfection)  Medications Medications  ondansetron (ZOFRAN) injection 4 mg (has no administration in time range)  0.9 %  sodium chloride infusion ( Intravenous Stopped 11/29/18 0602)  colchicine tablet 0.6 mg (has no administration in time range)  ibuprofen (ADVIL) tablet 800 mg (has no administration in time range)  atorvastatin (LIPITOR) tablet 80 mg (has no administration in time range)  metoprolol tartrate (LOPRESSOR) tablet 12.5 mg (12.5 mg Oral Given 11/28/18 2233)  isosorbide dinitrate (ISORDIL) tablet 5 mg (has no administration in time range)  clopidogrel (PLAVIX) tablet 75 mg (has no administration in time range)  FLUoxetine (PROZAC) capsule 40 mg (has no administration in time range)  enoxaparin (LOVENOX) injection 40 mg (40 mg Subcutaneous Given 11/28/18 2233)  acetaminophen (TYLENOL) tablet 650 mg (has no administration in time range)    Or  acetaminophen (TYLENOL) suppository 650 mg (  has no administration in time range)  insulin aspart (novoLOG) injection 0-9 Units (0 Units Subcutaneous Not Given 11/29/18 0430)  pantoprazole (PROTONIX) injection 40 mg (40 mg  Intravenous Given 11/28/18 2233)  famotidine (PEPCID) 10 mg in sodium chloride 0.9 % 25 mL (0 mg Intravenous Stopped 11/29/18 0008)  metoCLOPramide (REGLAN) injection 5 mg (5 mg Intravenous Given 11/29/18 0607)  insulin pump (0 each Subcutaneous Hold 11/29/18 0438)  sodium chloride flush (NS) 0.9 % injection 3 mL (3 mLs Intravenous Given 11/28/18 2049)  ondansetron (ZOFRAN) injection 4 mg (4 mg Intravenous Given 11/28/18 1538)  sodium chloride 0.9 % bolus 500 mL (0 mLs Intravenous Stopped 11/28/18 2040)  promethazine (PHENERGAN) injection 25 mg (25 mg Intramuscular Given 11/28/18 1811)  LORazepam (ATIVAN) injection 1 mg (1 mg Intravenous Given 11/28/18 2048)  sodium chloride 0.9 % bolus 1,000 mL (0 mLs Intravenous Stopped 11/28/18 2257)    Mobility walks Low fall risk   Focused Assessments Cardiac Assessment Handoff:    Lab Results  Component Value Date   TROPONINI 0.22 ([place]) 05/25/2018   Lab Results  Component Value Date   DDIMER >20.00 (H) 11/24/2018   Does the Patient currently have chest pain? No     R Recommendations: See Admitting Provider Note  Report given to:   Additional Notes: .

## 2018-11-29 NOTE — Progress Notes (Signed)
Progress Note    Jennifer Hogan  [id number] DOB: October 20, 1970  DOA: 11/28/2018 PCP: [name], NP    Brief Narrative:     Medical records reviewed and are as summarized below:  Jennifer Hogan is an 48 y.o. 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  Patient states she was doing okay yesterday.  Since this morning she is having epigastric abdominal pain/cramps with a burning sensation.  She has had multiple episodes of vomiting and has not been able to keep any food down.  She also had a few episodes of nonbloody diarrhea.  Denies recent antibiotic use.  Protonix was prescribed to her to be taken along with NSAIDs during her recent hospitalization.  Assessment/Plan:   Principal Problem:   Abdominal pain Active Problems:   Coronary artery disease   DKA, type 1 (HCC)   Intractable nausea and vomiting   Diarrhea   Epigastric abdominal pain, intractable nausea and vomiting History of insulin-dependent type 1 diabetes on insulin pump Patient endorses epigastric burning sensation.  Suspect GERD/dyspepsia/gstritis and gastroparesis could also be contributing.  recently placed on scheduled ibuprofen. ? If she has been taking the PPI.   - CT abdomen pelvis done on 11/1 showing no acute intra-abdominal abnormality.   -IV Protonix and IV Pepcid for GERD -IV Reglan -IV Zofran as needed for nausea/vomiting -Kpad + marijuana on previous drug screen- ? If this is contributing if she still uses  Type 1 DM continue insulin pump   Diarrhea Patient reports having several episodes of nonbloody diarrhea today.  Denies recent antibiotic use.  Afebrile and no leukocytosis.  No episodes of diarrhea since she has been in the ED. 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  ST abnormality in lateral leads seen on prior tracing from 11/24/2018 as well.  Patient is not endorsing chest pain. -Cardiac monitoring -Continue colchicine  -d/c iburpofen -Continue aspirin, Plavix, metoprolol, Isordil, Lipitor  Abnormal CXR Chest x-ray showing worsening left lower lobe atelectasis or infiltrate. Pneumonia less likely given no fever or leukocytosis.  No signs of respiratory distress. procalcitonin level negative -incentive spirometry  Depression -Continue Prozac   Family Communication/Anticipated D/C date and plan/Code Status   DVT prophylaxis: Lovenox ordered. Code Status: Full Code.  Family Communication:  Disposition Plan: home in AM?  Still not eating well and has had several recent hospitalizations.  Will change to inpatient as crossing 2 midnights   Medical Consultants:    None.    Subjective:  Get nauseous when eating All her abdominal issue improved when she took at mushroom supplement   Objective:    Vitals:   11/29/18 0600 11/29/18 0700 11/29/18 0850 11/29/18 1203  BP: 112/70 114/71 118/81 99/64  Pulse: 87 89 90 86  Resp: 16 17 18    Temp:   98.6 F (37 C) 98.9 F (37.2 C)  TempSrc:   Oral Oral  SpO2: 94% 95% 97% 96%  Weight:   64.5 kg   Height:   5\' 5"  (1.651 m)     Intake/Output Summary (Last 24 hours) at 11/29/2018 1323 Last data filed at 11/29/2018  0602 Gross per 24 hour  Intake 550 ml  Output -  Net 550 ml   Filed Weights   11/29/18 0850  Weight: 64.5 kg    Exam: In bed, NAD rrr No increased work of breathing +BS but diminished No LE edema  Data Reviewed:   I have personally reviewed following labs and imaging studies:  Labs: Labs show the following:   Basic Metabolic Panel: Recent Labs  Lab  11/25/18 0227 11/25/18 0612 11/26/18 0206 11/28/18 1455 11/29/18 0022  NA 137 136 139 135 136  K 3.7 4.0 4.7 3.9 4.8  CL 112* 109 110 103 106  CO2 17* 15* 21* 17* 20*  GLUCOSE 152* 160* 89 303* 247*  BUN 10 8 9 9 9   CREATININE 0.68 0.78 0.81 0.84 0.73  CALCIUM 8.4* 8.2* 8.7* 8.4* 8.3*   GFR Estimated Creatinine Clearance: 76.5 mL/min (by C-G formula based on SCr of 0.73 mg/dL). Liver Function Tests: Recent Labs  Lab 11/23/18 2223 11/25/18 0612 11/28/18 1455  AST 15 15 20   ALT 12 10 14   ALKPHOS 94 77 92  BILITOT 0.8 0.6 0.9  PROT 7.0 6.2* 7.1  ALBUMIN 3.2* 2.6* 3.1*   Recent Labs  Lab 11/23/18 2223 11/28/18 1455  LIPASE 12 19   No results for input(s): AMMONIA in the last 168 hours. Coagulation profile No results for input(s): INR, PROTIME in the last 168 hours.  CBC: Recent Labs  Lab 11/23/18 2223 11/24/18 1030 11/25/18 0612 11/26/18 0206 11/28/18 1455  WBC 8.1 9.2 10.1 8.9 10.2  HGB 10.8* 10.6* 10.0* 10.4* 10.7*  HCT 32.5* 32.9* 31.4* 32.9* 33.0*  MCV 91.0 93.2 94.0 94.5 91.9  PLT 733* 725* 669* 659* 593*   Cardiac Enzymes: No results for input(s): CKTOTAL, CKMB, CKMBINDEX, TROPONINI in the last 168 hours. BNP (last 3 results) No results for input(s): PROBNP in the last 8760 hours. CBG: Recent Labs  Lab 11/29/18 0122 11/29/18 0429 11/29/18 0533 11/29/18 0939 11/29/18 1106  GLUCAP 216* 217* 173* 161* 167*   D-Dimer: No results for input(s): DDIMER in the last 72 hours. Hgb A1c: No results for input(s): HGBA1C in the last 72 hours. Lipid Profile: No results for input(s): CHOL, HDL, LDLCALC, TRIG, CHOLHDL, LDLDIRECT in the last 72 hours. Thyroid function studies: No results for input(s): TSH, T4TOTAL, T3FREE, THYROIDAB in the last 72 hours.  Invalid input(s): FREET3 Anemia work up: No results for input(s): VITAMINB12, FOLATE, FERRITIN, TIBC, IRON, RETICCTPCT in the last 72 hours. Sepsis Labs: Recent Labs  Lab 11/24/18 1030 11/24/18  1502 11/24/18 1802 11/25/18 0612 11/26/18 0206 11/28/18 1455 11/28/18 2039 11/29/18 0022  PROCALCITON  --   --   --   --   --   --   --  <0.10  WBC 9.2  --   --  10.1 8.9 10.2  --   --   LATICACIDVEN  --  1.6 2.2*  --   --   --  1.5 1.3    Microbiology Recent Results (from the past 240 hour(s))  SARS CORONAVIRUS 2 (TAT 6-24 HRS) Nasopharyngeal Nasopharyngeal Swab     Status: None   Collection Time: 11/24/18  5:55 AM   Specimen: Nasopharyngeal Swab  Result Value Ref Range Status   SARS Coronavirus 2 NEGATIVE NEGATIVE Final    Comment: (NOTE) SARS-CoV-2 target nucleic acids are NOT DETECTED. The SARS-CoV-2 RNA is generally detectable in upper and lower respiratory specimens during the acute phase of infection. Negative results do not preclude SARS-CoV-2 infection,  do not rule out co-infections with other pathogens, and should not be used as the sole basis for treatment or other patient management decisions. Negative results must be combined with clinical observations, patient history, and epidemiological information. The expected result is Negative. Fact Sheet for Patients: SugarRoll.be Fact Sheet for Healthcare Providers: https://www.woods-mathews.com/ This test is not yet approved or cleared by the [place] FDA and  has been authorized for detection and/or diagnosis of SARS-CoV-2 by FDA under an Emergency Use Authorization (EUA). This EUA will remain  in effect (meaning this test can be used) for the duration of the COVID-19 declaration under Section 56 4(b)(1) of the Act, 21 U.S.C. section 360bbb-3(b)(1), unless the authorization is terminated or revoked sooner. Performed at [facility], [place] [address] [place], [place] [address]   MRSA PCR Screening     Status: None   Collection Time: 11/24/18  7:04 AM   Specimen: Nasal Mucosa; Nasopharyngeal  Result Value Ref Range Status   MRSA by PCR NEGATIVE NEGATIVE Final     Comment:        The GeneXpert MRSA Assay (FDA approved for NASAL specimens only), is one component of a comprehensive MRSA colonization surveillance program. It is not intended to diagnose MRSA infection nor to guide or monitor treatment for MRSA infections. Performed at [facility], [place] [address], [place], [place] [address]   Culture, Urine     Status: None   Collection Time: 11/24/18  8:45 AM   Specimen: Urine, Clean Catch  Result Value Ref Range Status   Specimen Description URINE, CLEAN CATCH  Final   Special Requests NONE  Final   Culture   Final    NO GROWTH Performed at [facility], [place] [address], [place], [place] [address]    Report Status 11/25/2018 FINAL  Final  Culture, blood (routine x 2)     Status: None   Collection Time: 11/24/18 10:15 AM   Specimen: BLOOD LEFT ARM  Result Value Ref Range Status   Specimen Description BLOOD LEFT ARM  Final   Special Requests   Final    BOTTLES DRAWN AEROBIC ONLY Blood Culture adequate volume   Culture   Final    NO GROWTH 5 DAYS Performed at [facility], [place] [address] [place], [place] [address]    Report Status 11/29/2018 FINAL  Final  Culture, blood (routine x 2)     Status: None   Collection Time: 11/24/18 10:27 AM   Specimen: BLOOD LEFT HAND  Result Value Ref Range Status   Specimen Description BLOOD LEFT HAND  Final   Special Requests   Final    BOTTLES DRAWN AEROBIC ONLY Blood Culture results may not be optimal due to an inadequate volume of blood received in culture bottles   Culture   Final    NO GROWTH 5 DAYS Performed at [facility], [place] [name], [place], [place] [address]    Report Status 11/29/2018 FINAL  Final  SARS CORONAVIRUS 2 (TAT 6-24 HRS) Nasopharyngeal     Status: None   Collection Time: 11/28/18  8:39 PM   Specimen: Nasopharyngeal  Result Value Ref Range Status   SARS Coronavirus 2 NEGATIVE NEGATIVE Final    Comment: (NOTE) SARS-CoV-2 target nucleic acids are  NOT DETECTED. The SARS-CoV-2 RNA is generally detectable in upper and lower respiratory specimens during the acute phase of infection. Negative results do not preclude SARS-CoV-2 infection, do not rule out co-infections  with other pathogens, and should not be used as the sole basis for treatment or other patient management decisions. Negative results must be combined with clinical observations, patient history, and epidemiological information. The expected result is Negative. Fact Sheet for Patients: SugarRoll.be Fact Sheet for Healthcare Providers: https://www.woods-mathews.com/ This test is not yet approved or cleared by the [place] FDA and  has been authorized for detection and/or diagnosis of SARS-CoV-2 by FDA under an Emergency Use Authorization (EUA). This EUA will remain  in effect (meaning this test can be used) for the duration of the COVID-19 declaration under Section 56 4(b)(1) of the Act, 21 U.S.C. section 360bbb-3(b)(1), unless the authorization is terminated or revoked sooner. Performed at [facility], [place] [address], [place], [place] [address]     Procedures and diagnostic studies:  Dg Chest Portable 1 View  Result Date: 11/28/2018 CLINICAL DATA:  Abdominal pain EXAM: PORTABLE CHEST 1 VIEW COMPARISON:  11/24/2018 FINDINGS: Prior CABG. Heart is normal size. Small left pleural effusion with left lower lobe atelectasis or infiltrate, worsening since prior study. Right lung clear. No acute bony abnormality. IMPRESSION: Small left pleural effusion. Worsening left lower lobe atelectasis or infiltrate. Electronically Signed   By: Rolm Baptise M.D.   On: 11/28/2018 20:06    Medications:   . atorvastatin  80 mg Oral q1800  . clopidogrel  75 mg Oral Q breakfast  . colchicine  0.6 mg Oral BID  . enoxaparin (LOVENOX) injection  40 mg Subcutaneous Q24H  . FLUoxetine  40 mg Oral Daily  . insulin aspart  0-9 Units  Subcutaneous Q4H  . insulin pump   Subcutaneous Q4H  . isosorbide dinitrate  5 mg Oral TID  . metoCLOPramide (REGLAN) injection  5 mg Intravenous Q6H  . metoprolol tartrate  12.5 mg Oral BID  . pantoprazole (PROTONIX) IV  40 mg Intravenous Q24H   Continuous Infusions: . famotidine (PEPCID) IV 10 mg (11/29/18 1234)     LOS: 0 days   Geradine Girt  [facility]   How to contact the [facility] Attending or Consulting provider [place] or covering provider during after hours [place], for this patient?  1. Check the care team in [facility] and look for a) attending/consulting [facility] provider listed and b) the [facility] team listed 2. Log into [email or web address] and use [place]'s universal password to access. If you do not have the password, please contact the hospital operator. 3. Locate the [facility] provider you are looking for under [facility] and page to a number that you can be directly reached. 4. If you still have difficulty reaching the provider, please page the [facility] (Director on Call) for the Hospitalists listed on amion for assistance.  11/29/2018, 1:23 PM

## 2018-11-30 DIAGNOSIS — E876 Hypokalemia: Secondary | ICD-10-CM

## 2018-11-30 DIAGNOSIS — K3184 Gastroparesis: Secondary | ICD-10-CM

## 2018-11-30 DIAGNOSIS — E108 Type 1 diabetes mellitus with unspecified complications: Secondary | ICD-10-CM

## 2018-11-30 DIAGNOSIS — R112 Nausea with vomiting, unspecified: Secondary | ICD-10-CM

## 2018-11-30 LAB — BASIC METABOLIC PANEL
Anion gap: 14 (ref 5–15)
BUN: 6 mg/dL (ref 6–20)
CO2: 19 mmol/L — ABNORMAL LOW (ref 22–32)
Calcium: 8.5 mg/dL — ABNORMAL LOW (ref 8.9–10.3)
Chloride: 103 mmol/L (ref 98–111)
Creatinine, Ser: 0.89 mg/dL (ref 0.44–1.00)
GFR calc Af Amer: >60 mL/min (ref >60)
GFR calc non Af Amer: >60 mL/min (ref >60)
Glucose, Bld: 259 mg/dL — ABNORMAL HIGH (ref 70–99)
Potassium: 3 mmol/L — ABNORMAL LOW (ref 3.5–5.1)
Sodium: 136 mmol/L (ref 135–145)

## 2018-11-30 LAB — GLUCOSE, CAPILLARY
Glucose-Capillary: 152 mg/dL — ABNORMAL HIGH (ref 70–99)
Glucose-Capillary: 174 mg/dL — ABNORMAL HIGH (ref 70–99)
Glucose-Capillary: 237 mg/dL — ABNORMAL HIGH (ref 70–99)
Glucose-Capillary: 282 mg/dL — ABNORMAL HIGH (ref 70–99)
Glucose-Capillary: 295 mg/dL — ABNORMAL HIGH (ref 70–99)
Glucose-Capillary: 70 mg/dL (ref 70–99)
Glucose-Capillary: 88 mg/dL (ref 70–99)

## 2018-11-30 MED ORDER — PROMETHAZINE HCL 25 MG/ML IJ SOLN
12.5000 mg | Freq: Four times a day (QID) | INTRAMUSCULAR | Status: DC | PRN
  Administered 2018-11-30 (×2): 12.5 mg via INTRAVENOUS
  Filled 2018-11-30 (×2): qty 1

## 2018-11-30 MED ORDER — TRAMADOL HCL 50 MG PO TABS
50.0000 mg | ORAL_TABLET | ORAL | Status: DC | PRN
  Administered 2018-12-02: 50 mg via ORAL
  Filled 2018-11-30: qty 2

## 2018-11-30 MED ORDER — SUCRALFATE 1 G PO TABS
1.0000 g | ORAL_TABLET | Freq: Three times a day (TID) | ORAL | Status: DC
  Administered 2018-12-01 – 2018-12-02 (×6): 1 g via ORAL
  Filled 2018-11-30 (×7): qty 1

## 2018-11-30 MED ORDER — LORAZEPAM 2 MG/ML IJ SOLN
0.5000 mg | Freq: Four times a day (QID) | INTRAMUSCULAR | Status: DC | PRN
  Administered 2018-11-30: 0.5 mg via INTRAVENOUS
  Filled 2018-11-30: qty 1

## 2018-11-30 MED ORDER — SODIUM CHLORIDE 0.9 % IV SOLN
INTRAVENOUS | Status: DC
  Administered 2018-11-30: 12:00:00 via INTRAVENOUS

## 2018-11-30 MED ORDER — LABETALOL HCL 5 MG/ML IV SOLN
5.0000 mg | INTRAVENOUS | Status: DC | PRN
  Administered 2018-11-30 (×3): 5 mg via INTRAVENOUS
  Filled 2018-11-30 (×3): qty 4

## 2018-11-30 MED ORDER — ASPIRIN EC 81 MG PO TBEC
81.0000 mg | DELAYED_RELEASE_TABLET | Freq: Every day | ORAL | Status: DC
  Administered 2018-12-01 – 2018-12-02 (×2): 81 mg via ORAL
  Filled 2018-11-30 (×4): qty 1

## 2018-11-30 MED ORDER — LEVOTHYROXINE SODIUM 100 MCG PO TABS
100.0000 ug | ORAL_TABLET | Freq: Every day | ORAL | Status: DC
  Administered 2018-12-01 – 2018-12-02 (×2): 100 ug via ORAL
  Filled 2018-11-30 (×2): qty 1

## 2018-11-30 MED ORDER — ALUM & MAG HYDROXIDE-SIMETH 200-200-20 MG/5ML PO SUSP
30.0000 mL | Freq: Once | ORAL | Status: AC
  Administered 2018-11-30: 08:00:00 30 mL via ORAL
  Filled 2018-11-30: qty 30

## 2018-11-30 MED ORDER — POTASSIUM CHLORIDE IN NACL 20-0.9 MEQ/L-% IV SOLN
INTRAVENOUS | Status: DC
  Administered 2018-11-30 – 2018-12-02 (×4): via INTRAVENOUS
  Filled 2018-11-30 (×3): qty 1000

## 2018-11-30 MED ORDER — LIDOCAINE VISCOUS HCL 2 % MT SOLN
15.0000 mL | Freq: Once | OROMUCOSAL | Status: AC
  Administered 2018-11-30: 15 mL via ORAL
  Filled 2018-11-30: qty 15

## 2018-11-30 MED ORDER — MORPHINE SULFATE (PF) 2 MG/ML IV SOLN
1.0000 mg | INTRAVENOUS | Status: DC | PRN
  Administered 2018-11-30: 1 mg via INTRAVENOUS
  Filled 2018-11-30 (×2): qty 1

## 2018-11-30 MED ORDER — FAMOTIDINE IN NACL 20-0.9 MG/50ML-% IV SOLN
20.0000 mg | Freq: Two times a day (BID) | INTRAVENOUS | Status: DC
  Administered 2018-11-30 – 2018-12-02 (×5): 20 mg via INTRAVENOUS
  Filled 2018-11-30 (×5): qty 50

## 2018-11-30 NOTE — Progress Notes (Signed)
Pt had an episode of nausea and vomiting, 10/10 upper mid abdominal pain this am stating her stomach "is on fire",  MD paged. Maalox, Lidocaine PO given. Pt vomited again, MD ordered morphine and phenerhan IV. after IV meds were given, pt states pain "subsided a little bit" 4/10 and still feel nauseous but appears more relaxed in the bed. Refused PO morning meds. Will try again later when pt feels better. MD aware.

## 2018-11-30 NOTE — Progress Notes (Signed)
Progress Note    Jennifer Hogan  [id number] DOB: 1970-09-23  DOA: 11/28/2018 PCP: [name], NP    Brief Narrative:     Medical records reviewed and are as summarized below:  Jennifer Hogan is an 48 y.o. 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  Patient states she was doing okay yesterday.  Since this morning she is having epigastric abdominal pain/cramps with a burning sensation.  She has had multiple episodes of vomiting and has not been able to keep any food down.  She also had a few episodes of nonbloody diarrhea.  Denies recent antibiotic use.  Protonix was prescribed to her to be taken along with NSAIDs during her recent hospitalization.  Assessment/Plan:   Principal Problem:   Abdominal pain Active Problems:   Coronary artery disease   DKA, type 1 (HCC)   Intractable nausea and vomiting   Diarrhea   Epigastric abdominal pain, intractable nausea and vomiting History of insulin-dependent type 1 diabetes on insulin pump Patient endorses epigastric burning sensation.  Suspect GERD/dyspepsia/gstritis and gastroparesis could also be contributing.  recently placed on scheduled ibuprofen. ? If she has been taking the PPI.   - CT abdomen pelvis done on 11/1 showing no acute intra-abdominal abnormality.   -IV Protonix and IV Pepcid for GERD -IV Reglan -IV Zofran as needed for nausea/vomiting to be changed to phenergan -Kpad + marijuana on previous drug screen- ? If this is contributing  Type 1 DM continue insulin pump   Diarrhea Patient reports having several episodes of nonbloody diarrhea today.  Denies recent antibiotic use.  Afebrile and no leukocytosis.  No episodes of diarrhea since she has been in the ED. 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  ST abnormality in lateral leads seen on prior tracing from 11/24/2018 as well.  Patient is not endorsing chest pain. -Cardiac monitoring -d/c colchicine  -d/c iburpofen -Continue aspirin, Plavix, metoprolol, Isordil, Lipitor  Abnormal CXR Chest x-ray showing worsening left lower lobe atelectasis or infiltrate. Pneumonia less likely given no fever or leukocytosis.  No signs of respiratory distress. procalcitonin level negative -incentive spirometry -no hypoxia  Depression -Continue Prozac   Family Communication/Anticipated D/C date and plan/Code Status   DVT prophylaxis: Lovenox ordered. Code Status: Full Code.  Family Communication:  Disposition Plan: pending resolution of symptoms   Medical Consultants:    None.    Subjective:  Tolerated food yesterday but this AM she began to vomit   Objective:    Vitals:   11/30/18 0446 11/30/18 0452 11/30/18 1106 11/30/18 1230  BP: 120/71  (!) 188/107 (!) 179/101  Pulse: 86  (!) 125 (!) 123  Resp: 20  17   Temp: 99 F (37.2 C)  99.5 F (37.5 C)   TempSrc: Oral  Oral   SpO2: 94%  96%   Weight:  66.2 kg    Height:        Intake/Output Summary (Last 24 hours) at 11/30/2018 1240 Last data filed at 11/30/2018 0424 Gross per 24 hour  Intake 240 ml  Output 900 ml  Net -  660 ml   Filed Weights   11/29/18 0850 11/30/18 0452  Weight: 64.5 kg 66.2 kg    Exam: In bed, vomiting into a trash can Tachycardic but regular +BS, soft, NT No LE edema Poor historian  Data Reviewed:   I have personally reviewed following labs and imaging studies:  Labs: Labs show the following:   Basic Metabolic Panel: Recent Labs  Lab 11/25/18 0612 11/26/18 0206 11/28/18 1455 11/29/18  0022 11/30/18 0948  NA 136 139 135 136 136  K 4.0 4.7 3.9 4.8 3.0*  CL 109 110 103 106 103  CO2 15* 21* 17* 20* 19*  GLUCOSE 160* 89 303* 247* 259*  BUN 8 9 9 9 6   CREATININE 0.78 0.81 0.84 0.73 0.89  CALCIUM 8.2* 8.7* 8.4* 8.3* 8.5*   GFR Estimated Creatinine Clearance: 68.8 mL/min (by C-G formula based on SCr of 0.89 mg/dL). Liver Function Tests: Recent Labs  Lab 11/23/18 2223 11/25/18 0612 11/28/18 1455  AST 15 15 20   ALT 12 10 14   ALKPHOS 94 77 92  BILITOT 0.8 0.6 0.9  PROT 7.0 6.2* 7.1  ALBUMIN 3.2* 2.6* 3.1*   Recent Labs  Lab 11/23/18 2223 11/28/18 1455  LIPASE 12 19   No results for input(s): AMMONIA in the last 168 hours. Coagulation profile No results for input(s): INR, PROTIME in the last 168 hours.  CBC: Recent Labs  Lab 11/23/18 2223 11/24/18 1030 11/25/18 0612 11/26/18 0206 11/28/18 1455  WBC 8.1 9.2 10.1 8.9 10.2  HGB 10.8* 10.6* 10.0* 10.4* 10.7*  HCT 32.5* 32.9* 31.4* 32.9* 33.0*  MCV 91.0 93.2 94.0 94.5 91.9  PLT 733* 725* 669* 659* 593*   Cardiac Enzymes: No results for input(s): CKTOTAL, CKMB, CKMBINDEX, TROPONINI in the last 168 hours. BNP (last 3 results) No results for input(s): PROBNP in the last 8760 hours. CBG: Recent Labs  Lab 11/29/18 2041 11/30/18 0050 11/30/18 0443 11/30/18 0806 11/30/18 1103  GLUCAP 174* 88 70 152* 282*   D-Dimer: No results for input(s): DDIMER in the last 72 hours. Hgb A1c: No results for input(s): HGBA1C in the last 72 hours. Lipid Profile: No results for input(s): CHOL, HDL, LDLCALC, TRIG, CHOLHDL, LDLDIRECT in the last 72 hours. Thyroid function studies: No results for input(s): TSH, T4TOTAL, T3FREE, THYROIDAB in the last 72 hours.  Invalid input(s): FREET3 Anemia work up: No results for input(s): VITAMINB12, FOLATE, FERRITIN, TIBC, IRON, RETICCTPCT in the last 72 hours. Sepsis Labs: Recent Labs  Lab 11/24/18 1030 11/24/18 1502 11/24/18 1802 11/25/18 0612 11/26/18 0206 11/28/18  1455 11/28/18 2039 11/29/18 0022  PROCALCITON  --   --   --   --   --   --   --  <0.10  WBC 9.2  --   --  10.1 8.9 10.2  --   --   LATICACIDVEN  --  1.6 2.2*  --   --   --  1.5 1.3    Microbiology Recent Results (from the past 240 hour(s))  SARS CORONAVIRUS 2 (TAT 6-24 HRS) Nasopharyngeal Nasopharyngeal Swab     Status: None   Collection Time: 11/24/18  5:55 AM   Specimen: Nasopharyngeal Swab  Result Value Ref Range Status   SARS Coronavirus 2 NEGATIVE NEGATIVE Final    Comment: (NOTE) SARS-CoV-2 target nucleic acids are NOT DETECTED. The SARS-CoV-2 RNA is generally detectable in upper and lower respiratory specimens during the acute phase of infection. Negative results do not preclude SARS-CoV-2 infection, do not rule out co-infections with other  pathogens, and should not be used as the sole basis for treatment or other patient management decisions. Negative results must be combined with clinical observations, patient history, and epidemiological information. The expected result is Negative. Fact Sheet for Patients: SugarRoll.be Fact Sheet for Healthcare Providers: https://www.woods-mathews.com/ This test is not yet approved or cleared by the [place] FDA and  has been authorized for detection and/or diagnosis of SARS-CoV-2 by FDA under an Emergency Use Authorization (EUA). This EUA will remain  in effect (meaning this test can be used) for the duration of the COVID-19 declaration under Section 56 4(b)(1) of the Act, 21 U.S.C. section 360bbb-3(b)(1), unless the authorization is terminated or revoked sooner. Performed at [facility], [place] [address], [place], [place] [address]   MRSA PCR Screening     Status: None   Collection Time: 11/24/18  7:04 AM   Specimen: Nasal Mucosa; Nasopharyngeal  Result Value Ref Range Status   MRSA by PCR NEGATIVE NEGATIVE Final    Comment:        The GeneXpert MRSA Assay (FDA approved for  NASAL specimens only), is one component of a comprehensive MRSA colonization surveillance program. It is not intended to diagnose MRSA infection nor to guide or monitor treatment for MRSA infections. Performed at [facility], [place] [address], [place], [place] [address]   Culture, Urine     Status: None   Collection Time: 11/24/18  8:45 AM   Specimen: Urine, Clean Catch  Result Value Ref Range Status   Specimen Description URINE, CLEAN CATCH  Final   Special Requests NONE  Final   Culture   Final    NO GROWTH Performed at [facility], [place] [address], [place], [place] [address]    Report Status 11/25/2018 FINAL  Final  Culture, blood (routine x 2)     Status: None   Collection Time: 11/24/18 10:15 AM   Specimen: BLOOD LEFT ARM  Result Value Ref Range Status   Specimen Description BLOOD LEFT ARM  Final   Special Requests   Final    BOTTLES DRAWN AEROBIC ONLY Blood Culture adequate volume   Culture   Final    NO GROWTH 5 DAYS Performed at [facility], [place] [address] [place], [place] [address]    Report Status 11/29/2018 FINAL  Final  Culture, blood (routine x 2)     Status: None   Collection Time: 11/24/18 10:27 AM   Specimen: BLOOD LEFT HAND  Result Value Ref Range Status   Specimen Description BLOOD LEFT HAND  Final   Special Requests   Final    BOTTLES DRAWN AEROBIC ONLY Blood Culture results may not be optimal due to an inadequate volume of blood received in culture bottles   Culture   Final    NO GROWTH 5 DAYS Performed at [facility], [place] [address] [place], [place] [address]    Report Status 11/29/2018 FINAL  Final  SARS CORONAVIRUS 2 (TAT 6-24 HRS) Nasopharyngeal     Status: None   Collection Time: 11/28/18  8:39 PM   Specimen: Nasopharyngeal  Result Value Ref Range Status   SARS Coronavirus 2 NEGATIVE NEGATIVE Final    Comment: (NOTE) SARS-CoV-2 target nucleic acids are NOT DETECTED. The SARS-CoV-2 RNA is generally detectable in  upper and lower respiratory specimens during the acute phase of infection. Negative results do not preclude SARS-CoV-2 infection, do not rule out co-infections with other pathogens, and should not be  used as the sole basis for treatment or other patient management decisions. Negative results must be combined with clinical observations, patient history, and epidemiological information. The expected result is Negative. Fact Sheet for Patients: SugarRoll.be Fact Sheet for Healthcare Providers: https://www.woods-mathews.com/ This test is not yet approved or cleared by the [place] FDA and  has been authorized for detection and/or diagnosis of SARS-CoV-2 by FDA under an Emergency Use Authorization (EUA). This EUA will remain  in effect (meaning this test can be used) for the duration of the COVID-19 declaration under Section 56 4(b)(1) of the Act, 21 U.S.C. section 360bbb-3(b)(1), unless the authorization is terminated or revoked sooner. Performed at [facility], [place] [address] [place], [place] [address]     Procedures and diagnostic studies:  Dg Chest Portable 1 View  Result Date: 11/28/2018 CLINICAL DATA:  Abdominal pain EXAM: PORTABLE CHEST 1 VIEW COMPARISON:  11/24/2018 FINDINGS: Prior CABG. Heart is normal size. Small left pleural effusion with left lower lobe atelectasis or infiltrate, worsening since prior study. Right lung clear. No acute bony abnormality. IMPRESSION: Small left pleural effusion. Worsening left lower lobe atelectasis or infiltrate. Electronically Signed   By: Rolm Baptise M.D.   On: 11/28/2018 20:06    Medications:   . aspirin EC  81 mg Oral Daily  . atorvastatin  80 mg Oral q1800  . clopidogrel  75 mg Oral Q breakfast  . enoxaparin (LOVENOX) injection  40 mg Subcutaneous Q24H  . FLUoxetine  40 mg Oral Daily  . insulin pump   Subcutaneous Q4H  . isosorbide dinitrate  5 mg Oral TID  . [START ON 12/01/2018]  levothyroxine  100 mcg Oral Q0600  . metoCLOPramide (REGLAN) injection  5 mg Intravenous Q6H  . metoprolol tartrate  12.5 mg Oral BID  . pantoprazole (PROTONIX) IV  40 mg Intravenous Q24H  . sucralfate  1 g Oral TID WC & HS   Continuous Infusions: . 0.9 % NaCl with KCl 20 mEq / L    . famotidine (PEPCID) IV 20 mg (11/30/18 1132)     LOS: 1 day   Geradine Girt  [facility]   How to contact the [facility] Attending or Consulting provider [place] or covering provider during after hours [place], for this patient?  1. Check the care team in [facility] and look for a) attending/consulting [facility] provider listed and b) the [facility] team listed 2. Log into [email or web address] and use [place]'s universal password to access. If you do not have the password, please contact the hospital operator. 3. Locate the [facility] provider you are looking for under [facility] and page to a number that you can be directly reached. 4. If you still have difficulty reaching the provider, please page the [facility] (Director on Call) for the Hospitalists listed on amion for assistance.  11/30/2018, 12:40 PM

## 2018-12-01 ENCOUNTER — Encounter (HOSPITAL_COMMUNITY): Payer: Self-pay

## 2018-12-01 LAB — BASIC METABOLIC PANEL
Anion gap: 10 (ref 5–15)
BUN: 8 mg/dL (ref 6–20)
CO2: 24 mmol/L (ref 22–32)
Calcium: 8.4 mg/dL — ABNORMAL LOW (ref 8.9–10.3)
Chloride: 104 mmol/L (ref 98–111)
Creatinine, Ser: 0.94 mg/dL (ref 0.44–1.00)
GFR calc Af Amer: >60 mL/min (ref >60)
GFR calc non Af Amer: >60 mL/min (ref >60)
Glucose, Bld: 208 mg/dL — ABNORMAL HIGH (ref 70–99)
Potassium: 4 mmol/L (ref 3.5–5.1)
Sodium: 138 mmol/L (ref 135–145)

## 2018-12-01 LAB — CBC
HCT: 33.8 % — ABNORMAL LOW (ref 36.0–46.0)
Hemoglobin: 11 g/dL — ABNORMAL LOW (ref 12.0–15.0)
MCH: 29.9 pg (ref 26.0–34.0)
MCHC: 32.5 g/dL (ref 30.0–36.0)
MCV: 91.8 fL (ref 80.0–100.0)
Platelets: 616 10*3/uL — ABNORMAL HIGH (ref 150–400)
RBC: 3.68 MIL/uL — ABNORMAL LOW (ref 3.87–5.11)
RDW: 14.9 % (ref 11.5–15.5)
WBC: 7.4 10*3/uL (ref 4.0–10.5)
nRBC: 0 % (ref 0.0–0.2)

## 2018-12-01 LAB — GLUCOSE, CAPILLARY
Glucose-Capillary: 166 mg/dL — ABNORMAL HIGH (ref 70–99)
Glucose-Capillary: 167 mg/dL — ABNORMAL HIGH (ref 70–99)
Glucose-Capillary: 176 mg/dL — ABNORMAL HIGH (ref 70–99)
Glucose-Capillary: 181 mg/dL — ABNORMAL HIGH (ref 70–99)
Glucose-Capillary: 227 mg/dL — ABNORMAL HIGH (ref 70–99)
Glucose-Capillary: 232 mg/dL — ABNORMAL HIGH (ref 70–99)
Glucose-Capillary: 245 mg/dL — ABNORMAL HIGH (ref 70–99)

## 2018-12-01 LAB — TSH: TSH: 17.338 u[IU]/mL — ABNORMAL HIGH (ref 0.350–4.500)

## 2018-12-01 MED ORDER — DOCUSATE SODIUM 100 MG PO CAPS
100.0000 mg | ORAL_CAPSULE | Freq: Every day | ORAL | Status: DC
  Administered 2018-12-01 – 2018-12-02 (×2): 100 mg via ORAL
  Filled 2018-12-01 (×2): qty 1

## 2018-12-01 NOTE — Progress Notes (Signed)
Progress Note    Jennifer Hogan  [id number] DOB: 1970-03-11  DOA: 11/28/2018 PCP: [name], NP    Brief Narrative:     Medical records reviewed and are as summarized below:  Jennifer Hogan is an 48 y.o. 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  Patient states she was doing okay yesterday.  Since this morning she is having epigastric abdominal pain/cramps with a burning sensation.  She has had multiple episodes of vomiting and has not been able to keep any food down.  She also had a few episodes of nonbloody diarrhea.  Denies recent antibiotic use.  Protonix was prescribed to her to be taken along with NSAIDs during her recent hospitalization.  Assessment/Plan:   Principal Problem:   Abdominal pain Active Problems:   Coronary artery disease   DKA, type 1 (HCC)   Intractable nausea and vomiting   Diarrhea   Epigastric abdominal pain, intractable nausea and vomiting History of insulin-dependent type 1 diabetes on insulin pump Patient endorses epigastric burning sensation.  Suspect GERD/dyspepsia/gstritis and gastroparesis could also be contributing.  recently placed on scheduled ibuprofen. ? If she has been taking the PPI.   - CT abdomen pelvis done on 11/1 showing no acute intra-abdominal abnormality.   -IV Protonix and IV Pepcid for GERD -IV Reglan -IV Zofran as needed for nausea/vomiting to be changed to phenergan -Kpad + marijuana- ? If this is contributing  Elevated plts -check Fe -? Need for colonoscopy-- do not think she has ever had one-- ? IBD -could be reactive post surgery?  Type 1 DM continue insulin pump   Elevated TSH -will need to confirm with patient if she is taking her levothyroxine   Diarrhea Patient reports having several episodes of nonbloody diarrhea today.  Denies recent antibiotic use.  Afebrile and no leukocytosis.  No episodes of diarrhea since she has been in the ED. 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  ST abnormality in lateral leads seen on prior tracing from 11/24/2018 as well.  Patient is not endorsing chest pain. -Cardiac monitoring -d/c colchicine  -d/c iburpofen -Continue aspirin, Plavix, metoprolol, Isordil, Lipitor  Abnormal CXR Chest x-ray showing worsening left lower lobe atelectasis or infiltrate. Pneumonia less likely given no fever or leukocytosis.  No signs of respiratory distress. procalcitonin level negative -incentive spirometry -no hypoxia  Depression -Continue Prozac   Family Communication/Anticipated D/C date and plan/Code Status   DVT prophylaxis: Lovenox ordered. Code Status: Full Code.  Family Communication:  Disposition Plan: pending resolution of symptoms   Medical Consultants:    None.    Subjective:   Doing better today Able to tolerated some PO  Objective:    Vitals:   11/30/18 1959 12/01/18 0453 12/01/18 0500 12/01/18 1142  BP: (!) 169/98 137/80  115/72  Pulse: (!) 119 99  89  Resp: 20 18  15   Temp: 99.3 F (37.4 C)   98.6 F (37 C)  TempSrc: Oral   Oral  SpO2: 95% 98%  100%  Weight:   62.8 kg   Height:  Intake/Output Summary (Last 24 hours) at 12/01/2018 1347 Last data filed at 12/01/2018 1148 Gross per 24 hour  Intake 2114.68 ml  Output -  Net 2114.68 ml   Filed Weights   11/29/18 0850 11/30/18 0452 12/01/18 0500  Weight: 64.5 kg 66.2 kg 62.8 kg    Exam: In bed, sitting up NAD rrr Sluggish bowel sounds No LE edema Moves all 4 ext  Data Reviewed:   I have  personally reviewed following labs and imaging studies:  Labs: Labs show the following:   Basic Metabolic Panel: Recent Labs  Lab 11/26/18 0206 11/28/18 1455 11/29/18 0022 11/30/18 0948 12/01/18 0411  NA 139 135 136 136 138  K 4.7 3.9 4.8 3.0* 4.0  CL 110 103 106 103 104  CO2 21* 17* 20* 19* 24  GLUCOSE 89 303* 247* 259* 208*  BUN 9 9 9 6 8   CREATININE 0.81 0.84 0.73 0.89 0.94  CALCIUM 8.7* 8.4* 8.3* 8.5* 8.4*   GFR Estimated Creatinine Clearance: 65.1 mL/min (by C-G formula based on SCr of 0.94 mg/dL). Liver Function Tests: Recent Labs  Lab 11/25/18 0612 11/28/18 1455  AST 15 20  ALT 10 14  ALKPHOS 77 92  BILITOT 0.6 0.9  PROT 6.2* 7.1  ALBUMIN 2.6* 3.1*   Recent Labs  Lab 11/28/18 1455  LIPASE 19   No results for input(s): AMMONIA in the last 168 hours. Coagulation profile No results for input(s): INR, PROTIME in the last 168 hours.  CBC: Recent Labs  Lab 11/25/18 0612 11/26/18 0206 11/28/18 1455 12/01/18 0411  WBC 10.1 8.9 10.2 7.4  HGB 10.0* 10.4* 10.7* 11.0*  HCT 31.4* 32.9* 33.0* 33.8*  MCV 94.0 94.5 91.9 91.8  PLT 669* 659* 593* 616*   Cardiac Enzymes: No results for input(s): CKTOTAL, CKMB, CKMBINDEX, TROPONINI in the last 168 hours. BNP (last 3 results) No results for input(s): PROBNP in the last 8760 hours. CBG: Recent Labs  Lab 11/30/18 1957 12/01/18 0035 12/01/18 0432 12/01/18 0756 12/01/18 1139  GLUCAP 237* 245* 176* 167* 166*   D-Dimer: No results for input(s): DDIMER in the last 72 hours. Hgb A1c: No results for input(s): HGBA1C in the last 72 hours. Lipid Profile: No results for input(s): CHOL, HDL, LDLCALC, TRIG, CHOLHDL, LDLDIRECT in the last 72 hours. Thyroid function studies: Recent Labs    12/01/18 0411  TSH 17.338*   Anemia work up: No results for input(s): VITAMINB12, FOLATE, FERRITIN, TIBC, IRON, RETICCTPCT in the last 72 hours. Sepsis Labs: Recent Labs  Lab 11/24/18 1502 11/24/18 1802 11/25/18 0612  11/26/18 0206 11/28/18 1455 11/28/18 2039 11/29/18 0022 12/01/18 0411  PROCALCITON  --   --   --   --   --   --  <0.10  --   WBC  --   --  10.1 8.9 10.2  --   --  7.4  LATICACIDVEN 1.6 2.2*  --   --   --  1.5 1.3  --     Microbiology Recent Results (from the past 240 hour(s))  SARS CORONAVIRUS 2 (TAT 6-24 HRS) Nasopharyngeal Nasopharyngeal Swab     Status: None   Collection Time: 11/24/18  5:55 AM   Specimen: Nasopharyngeal Swab  Result Value Ref Range Status   SARS Coronavirus 2 NEGATIVE NEGATIVE Final    Comment: (NOTE) SARS-CoV-2 target nucleic acids are NOT DETECTED. The SARS-CoV-2 RNA is generally detectable in upper and lower respiratory specimens during the acute phase of infection. Negative results do not preclude SARS-CoV-2 infection, do  not rule out co-infections with other pathogens, and should not be used as the sole basis for treatment or other patient management decisions. Negative results must be combined with clinical observations, patient history, and epidemiological information. The expected result is Negative. Fact Sheet for Patients: SugarRoll.be Fact Sheet for Healthcare Providers: https://www.woods-mathews.com/ This test is not yet approved or cleared by the [place] FDA and  has been authorized for detection and/or diagnosis of SARS-CoV-2 by FDA under an Emergency Use Authorization (EUA). This EUA will remain  in effect (meaning this test can be used) for the duration of the COVID-19 declaration under Section 56 4(b)(1) of the Act, 21 U.S.C. section 360bbb-3(b)(1), unless the authorization is terminated or revoked sooner. Performed at [facility], [place] [address], [place], [place] [address]   MRSA PCR Screening     Status: None   Collection Time: 11/24/18  7:04 AM   Specimen: Nasal Mucosa; Nasopharyngeal  Result Value Ref Range Status   MRSA by PCR NEGATIVE NEGATIVE Final    Comment:        The  GeneXpert MRSA Assay (FDA approved for NASAL specimens only), is one component of a comprehensive MRSA colonization surveillance program. It is not intended to diagnose MRSA infection nor to guide or monitor treatment for MRSA infections. Performed at [facility], [place] [address] [place], [place] [address]   Culture, Urine     Status: None   Collection Time: 11/24/18  8:45 AM   Specimen: Urine, Clean Catch  Result Value Ref Range Status   Specimen Description URINE, CLEAN CATCH  Final   Special Requests NONE  Final   Culture   Final    NO GROWTH Performed at [facility], [place] [address] [place], [place] [address]    Report Status 11/25/2018 FINAL  Final  Culture, blood (routine x 2)     Status: None   Collection Time: 11/24/18 10:15 AM   Specimen: BLOOD LEFT ARM  Result Value Ref Range Status   Specimen Description BLOOD LEFT ARM  Final   Special Requests   Final    BOTTLES DRAWN AEROBIC ONLY Blood Culture adequate volume   Culture   Final    NO GROWTH 5 DAYS Performed at [facility], [place] [address], [place], [place] [address]    Report Status 11/29/2018 FINAL  Final  Culture, blood (routine x 2)     Status: None   Collection Time: 11/24/18 10:27 AM   Specimen: BLOOD LEFT HAND  Result Value Ref Range Status   Specimen Description BLOOD LEFT HAND  Final   Special Requests   Final    BOTTLES DRAWN AEROBIC ONLY Blood Culture results may not be optimal due to an inadequate volume of blood received in culture bottles   Culture   Final    NO GROWTH 5 DAYS Performed at [facility], [place] [address], [place], [place] [address]    Report Status 11/29/2018 FINAL  Final  SARS CORONAVIRUS 2 (TAT 6-24 HRS) Nasopharyngeal     Status: None   Collection Time: 11/28/18  8:39 PM   Specimen: Nasopharyngeal  Result Value Ref Range Status   SARS Coronavirus 2 NEGATIVE NEGATIVE Final    Comment: (NOTE) SARS-CoV-2 target nucleic acids are NOT DETECTED. The  SARS-CoV-2 RNA is generally detectable in upper and lower respiratory specimens during the acute phase of infection. Negative results do not preclude SARS-CoV-2 infection, do not rule out co-infections with  other pathogens, and should not be used as the sole basis for treatment or other patient management decisions. Negative results must be combined with clinical observations, patient history, and epidemiological information. The expected result is Negative. Fact Sheet for Patients: SugarRoll.be Fact Sheet for Healthcare Providers: https://www.woods-mathews.com/ This test is not yet approved or cleared by the [place] FDA and  has been authorized for detection and/or diagnosis of SARS-CoV-2 by FDA under an Emergency Use Authorization (EUA). This EUA will remain  in effect (meaning this test can be used) for the duration of the COVID-19 declaration under Section 56 4(b)(1) of the Act, 21 U.S.C. section 360bbb-3(b)(1), unless the authorization is terminated or revoked sooner. Performed at [facility], [place] [address], [place], [place] [address]     Procedures and diagnostic studies:  No results found.  Medications:   . aspirin EC  81 mg Oral Daily  . atorvastatin  80 mg Oral q1800  . clopidogrel  75 mg Oral Q breakfast  . enoxaparin (LOVENOX) injection  40 mg Subcutaneous Q24H  . FLUoxetine  40 mg Oral Daily  . insulin pump   Subcutaneous Q4H  . isosorbide dinitrate  5 mg Oral TID  . levothyroxine  100 mcg Oral Q0600  . metoCLOPramide (REGLAN) injection  5 mg Intravenous Q6H  . metoprolol tartrate  12.5 mg Oral BID  . pantoprazole (PROTONIX) IV  40 mg Intravenous Q24H  . sucralfate  1 g Oral TID WC & HS   Continuous Infusions: . 0.9 % NaCl with KCl 20 mEq / L 75 mL/hr at 12/01/18 0237  . famotidine (PEPCID) IV Stopped (12/01/18 0948)     LOS: 2 days   Geradine Girt  [facility]   How to contact the [facility]  Attending or Consulting provider [place] or covering provider during after hours [place], for this patient?  1. Check the care team in [facility] and look for a) attending/consulting TRH provider listed and b) the [facility] team listed 2. Log into [email or web address] and use [place]'s universal password to access. If you do not have the password, please contact the hospital operator. 3. Locate the [facility] provider you are looking for under [facility] and page to a number that you can be directly reached. 4. If you still have difficulty reaching the provider, please page the [facility] (Director on Call) for the Hospitalists listed on amion for assistance.  12/01/2018, 1:47 PM

## 2018-12-01 NOTE — Progress Notes (Signed)
Patient sleeping during shift report.      

## 2018-12-01 NOTE — Progress Notes (Signed)
MD at bedside, incentive spirometer at bedside.

## 2018-12-01 NOTE — Progress Notes (Signed)
Inpatient Diabetes Program Recommendations  AACE/ADA: New Consensus Statement on Inpatient Glycemic Control (2015)  Target Ranges:  Prepandial:   less than 140 mg/dL      Peak postprandial:   less than 180 mg/dL (1-2 hours)      Critically ill patients:  140 - 180 mg/dL   Lab Results  Component Value Date   GLUCAP 227 (H) 12/01/2018   HGBA1C 10.2 (H) 11/02/2018    Review of Glycemic Control  Diabetes history: DM1 Outpatient Diabetes medications: Insulin pump Current orders for Inpatient glycemic control: Insulin pump  Her current pump settings are: Basal Rate- 0.675 units/hr 24 hours per day Total basal per 24 hour period- 16.2 units Carb Ratio- 1 unit for every 17 grams Carbohydrates Correction Ratio- 1 unit for every 60 mg/dl above Target CBG Target CBG 100 mg/dl  Spoke with pt in room on 11/06. Also spoke with Dr Eliseo Squires regarding d/cing Novolog s/s. Diabetes Coordinators familiar with pt from previous visits.  Inpatient Diabetes Program Recommendations:     Continue to follow.  Thank you. Lorenda Peck, RD, LDN, CDE Inpatient Diabetes Coordinator [phone]

## 2018-12-02 LAB — IRON AND TIBC
Iron: 40 ug/dL (ref 28–170)
Saturation Ratios: 13 % (ref 10.4–31.8)
TIBC: 311 ug/dL (ref 250–450)
UIBC: 271 ug/dL

## 2018-12-02 LAB — BASIC METABOLIC PANEL
Anion gap: 10 (ref 5–15)
BUN: 5 mg/dL — ABNORMAL LOW (ref 6–20)
CO2: 22 mmol/L (ref 22–32)
Calcium: 8.5 mg/dL — ABNORMAL LOW (ref 8.9–10.3)
Chloride: 105 mmol/L (ref 98–111)
Creatinine, Ser: 0.81 mg/dL (ref 0.44–1.00)
GFR calc Af Amer: >60 mL/min (ref >60)
GFR calc non Af Amer: >60 mL/min (ref >60)
Glucose, Bld: 144 mg/dL — ABNORMAL HIGH (ref 70–99)
Potassium: 3.4 mmol/L — ABNORMAL LOW (ref 3.5–5.1)
Sodium: 137 mmol/L (ref 135–145)

## 2018-12-02 LAB — CBC
HCT: 31.6 % — ABNORMAL LOW (ref 36.0–46.0)
Hemoglobin: 10.3 g/dL — ABNORMAL LOW (ref 12.0–15.0)
MCH: 30.3 pg (ref 26.0–34.0)
MCHC: 32.6 g/dL (ref 30.0–36.0)
MCV: 92.9 fL (ref 80.0–100.0)
Platelets: 611 10*3/uL — ABNORMAL HIGH (ref 150–400)
RBC: 3.4 MIL/uL — ABNORMAL LOW (ref 3.87–5.11)
RDW: 14.9 % (ref 11.5–15.5)
WBC: 8.1 10*3/uL (ref 4.0–10.5)
nRBC: 0 % (ref 0.0–0.2)

## 2018-12-02 LAB — GLUCOSE, CAPILLARY
Glucose-Capillary: 129 mg/dL — ABNORMAL HIGH (ref 70–99)
Glucose-Capillary: 129 mg/dL — ABNORMAL HIGH (ref 70–99)
Glucose-Capillary: 177 mg/dL — ABNORMAL HIGH (ref 70–99)

## 2018-12-02 MED ORDER — METOCLOPRAMIDE HCL 5 MG PO TABS: 5.0000 mg | ORAL_TABLET | Freq: Two times a day (BID) | ORAL | 1 refills | Status: DC | PRN

## 2018-12-02 MED ORDER — ONDANSETRON 4 MG PO TBDP: 4.0000 mg | ORAL_TABLET | Freq: Three times a day (TID) | ORAL | 0 refills | Status: DC | PRN

## 2018-12-02 NOTE — TOC Initial Note (Signed)
Transition of Care [facility]) - Initial/Assessment Note    Patient Details  Name: Jennifer Hogan MRN: [id number] Date of Birth: 1970/04/06  Transition of Care [facility]) CM/SW Contact:    Zenon Mayo, RN Phone Number: 12/02/2018, 10:17 AM  Clinical Narrative:                 From home alone, she has an insulin pump,  She has transportation at dc. She is established at the [facility] clinic.  She is not eligible for Match , she just used Match on last discharge last week.  NCM Notified MD to send scripts to [facility] clinic.   Expected Discharge Plan: Home/Self Care Barriers to Discharge: No Barriers Identified   Patient Goals and CMS Choice Patient states their goals for this hospitalization and ongoing recovery are:: get better   Choice offered to / list presented to : NA  Expected Discharge Plan and Services Expected Discharge Plan: Home/Self Care In-house Referral: PCP / Health Connect Discharge Planning Services: CM Consult Post Acute Care Choice: NA Living arrangements for the past 2 months: Single Family Home                 DME Arranged: (NA)         HH Arranged: NA          Prior Living Arrangements/Services Living arrangements for the past 2 months: Single Family Home Lives with:: Self Patient language and need for interpreter reviewed:: Yes Do you feel safe going back to the place where you live?: Yes      Need for Family Participation in Patient Care: No (Comment) Care giver support system in place?: No (comment)   Criminal Activity/Legal Involvement Pertinent to Current Situation/Hospitalization: No - Comment as needed  Activities of Daily Living Home Assistive Devices/Equipment: Insulin Pump ADL Screening (condition at time of admission) Patient's cognitive ability adequate to safely complete daily activities?: Yes Is the patient deaf or have difficulty hearing?: No Does the patient have difficulty seeing, even when wearing glasses/contacts?: No Does the  patient have difficulty concentrating, remembering, or making decisions?: No Patient able to express need for assistance with ADLs?: Yes Does the patient have difficulty dressing or bathing?: No Independently performs ADLs?: Yes (appropriate for developmental age) Does the patient have difficulty walking or climbing stairs?: No Weakness of Legs: None Weakness of Arms/Hands: None  Permission Sought/Granted                  Emotional Assessment Appearance:: Appears stated age Attitude/Demeanor/Rapport: Engaged Affect (typically observed): Appropriate Orientation: : Oriented to Self, Oriented to Place, Oriented to  Time, Oriented to Situation Alcohol / Substance Use: Not Applicable Psych Involvement: No (comment)  Admission diagnosis:  Gastroparesis [K31.84] Hyperglycemia [R73.9] Abdominal pain, unspecified abdominal location [R10.9] Patient Active Problem List   Diagnosis Date Noted  . Abdominal pain 11/28/2018  . Intractable nausea and vomiting 11/28/2018  . Diarrhea 11/28/2018  . DKA (diabetic ketoacidoses) ([place]) 11/25/2018  . Sinus tachycardia 11/24/2018  . Chronic systolic heart failure ([place])   . Cardiomyopathy ([place])   . Hypotension 10/29/2018  . DKA, type 1 ([place]) 05/24/2018  . Moderate episode of recurrent major depressive disorder ([place]) 03/27/2018  . Epigastric pain   . Hyperglycemia due to type 1 diabetes mellitus ([place]) 02/14/2018  . Hyperlipidemia LDL goal <70 02/14/2018  . S/P drug eluting coronary stent placement   . ACS (acute coronary syndrome) ([place]) 02/08/2018  . ST elevation myocardial infarction (STEMI) ([place]) 02/08/2018  .  DKA (diabetic ketoacidosis) ([place]) 12/31/2017  . Marijuana abuse 12/31/2017  . Gastroparesis 12/06/2017  . Gastroparesis due to DM ([place]) 03/28/2017  . Coronary artery disease 03/28/2017  . Ischemic cardiomyopathy 03/28/2017  . Pure hypercholesterolemia   . NSTEMI (non-ST elevated myocardial infarction) ([place])   . Anemia 03/17/2017  .  Allergic state 08/07/2015  . Vitiligo 07/09/2012  . Type 1 diabetes mellitus ([place]) 07/09/2012  . Depression 12/04/2010  . Non-intractable vomiting with nausea 12/04/2010  . Hypothyroidism 12/04/2010   PCP:  [name], NP Pharmacy:   [place] ([place]), [place] - [id number] [place]) [place] [address] Phone: [phone] Fax: [phone]     Social Determinants of Health (SDOH) Interventions    Readmission Risk Interventions Readmission Risk Prevention Plan 12/02/2018 11/26/2018 11/09/2018  Transportation Screening Complete Complete -  PCP or Specialist Appt within 3-5 Days Complete Complete -  Not Complete comments - - -  [place] or Home Care Consult Complete Complete -  Social Work Consult for [place] Planning/Counseling Complete Complete -  Palliative Care Screening Complete Complete -  Medication Review Press photographer) Complete Complete -  PCP or Specialist appointment within 3-5 days of discharge - - Complete  [place] or [place] - - Complete  SW Recovery Care/Counseling Consult - - Complete  [place] - - Not Applicable  Some recent data might be hidden

## 2018-12-02 NOTE — TOC Transition Note (Addendum)
Transition of Care [facility]) - CM/SW Discharge Note   Patient Details  Name: Jennifer Hogan MRN: [id number] Date of Birth: 1971-01-08  Transition [facility] Care [facility]) CM/SW Contact:  Zenon Mayo, RN Phone Number: 12/02/2018, 10:19 AM   Clinical Narrative:    From home alone, she has an insulin pump,  She has transportation at dc. She is established at the [facility] clinic.  She is not eligible for Match , she just used Match on last discharge last week.  NCM Notified MD to send scripts to [facility] clinic.  MD sent meds to [facility] on [address].  NCM tried to call to see what the price was for patient, the rep said their computers are down so would not be able to give me any information.  NCM informed Staff Eun.     Final next level of care: Home/Self Care Barriers to Discharge: No Barriers Identified   Patient Goals and CMS Choice Patient states their goals for this hospitalization and ongoing recovery are:: gey better   Choice offered to / list presented to : NA  Discharge Placement                       Discharge Plan and Services In-house Referral: PCP / Health Connect Discharge Planning Services: CM Consult Post Acute Care Choice: NA          DME Arranged: (NA)         HH Arranged: NA          Social Determinants of Health (SDOH) Interventions     Readmission Risk Interventions Readmission Risk Prevention Plan 12/02/2018 11/26/2018 11/09/2018  Transportation Screening Complete Complete -  PCP or Specialist Appt within 3-5 Days Complete Complete -  Not Complete comments - - -  [place] or Home Care Consult Complete Complete -  Social Work Consult for [place] Planning/Counseling Complete Complete -  Palliative Care Screening Complete Complete -  Medication Review Press photographer) Complete Complete -  PCP or Specialist appointment within 3-5 days of discharge - - Complete  [place]RI or [place] - - Complete  SW Recovery Care/Counseling Consult - - Complete   [place] - - Not Applicable  Some recent data might be hidden

## 2018-12-02 NOTE — Discharge Summary (Signed)
[facility] Discharge Summary   Patient: Jennifer Hogan [id number]   PCP: [name], NP DOB: 1970-08-18   Date of admission: 11/28/2018   Date of discharge:  12/02/2018    Discharge Diagnoses:  Principal diagnosis Epigastric abdominal pain, intractable nausea and vomiting  Principal Problem:   Abdominal pain Active Problems:   Coronary artery disease   DKA, type 1 (HCC)   Intractable nausea and vomiting   Diarrhea  Admitted From: home Disposition:  Home    Recommendations for Outpatient Follow-up:  1. PCP: 1 week. May obtain a GI ref from PCP.  2. Endo: Pt has an endocrinologist; encourage to f/u in 2 week.   Follow-up Information    [place]. Go on 12/17/2018.   Why: 9:10 am follow up Contact information: [place] [phone]         Diet recommendation: Encourage fluid intake as much as possible; fresh cardiac diet.   Activity: The patient is advised to gradually reintroduce usual activities,as tolerated .  Discharge Condition: fair  Code Status: Full code   History of present illness: on admission:  Jennifer Hogan is a 48 y.o. female with medical history significant of upon diabetes with insulin pump, frequent DKA's, possible gastroparesis, coronary artery disease with recent coronary artery bypass grafting on October 10 who is recuperating from that presenting with nausea and episode of vomiting and persistent abdominal pain mainly in the epigastric region.  Patient was seen and evaluated in the ER.  She was found to be persistently tachycardic.  She is weak.  Patient is hyperglycemic but not in DKA.  She appears dehydrated.  She is being admitted for observation and management of the sinus tachycardia which is persistent.  Hospital Course:  Jennifer Hogan is an 48 y.o. 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 Since this morning she is having epigastric abdominal pain/cramps with a burning sensation. She has had multiple episodes of vomiting and has not been able to keep any food down. She also had a few episodes of nonbloody diarrhea. Denies recent antibiotic use. Protonix was prescribed to her to be taken along with NSAIDs during her recent hospitalization.  Epigastric abdominal pain, intractable nausea and vomiting: Resolved    Patient endorses epigastric burning sensation. Suspect GERD/dyspepsia/gstritis and gastroparesis could also be contributing. recently placed on scheduled ibuprofen. ? If she has been taking the PPI.  - D/C ibuprofen on discharge  -CT abdomen pelvis done on 11/1 showing no acute intra-abdominal abnormality. -Patient had a colonoscopy done 2 years ago and was normal as per patient - May need an EGD therefore recommend follow-up with PCP for GI outpatient referral -IV Protonix and IV Pepcid for GERD: PO at home  -IV Reglan; given Rx for PO -IV Zofran: Rx given for PO + Hx of marijuana- ? If this is contributing - Advised. Pt may f/u with PCP   Elevated plts - Iron panel WNL  - But improved from admission  -could be reactive post surgery?  Type 1 DM continue insulin pump   Elevated TSH -Patient has been on home levothyroxine -Patient has an endocrinologist recommended to follow-up within endocrinologist in 1 to 2 weeks  Diarrhea - Improved  On admission Pt was having several episodes of nonbloody diarrhea today. Denies recent antibiotic use. Afebrile and no leukocytosis.No episodes of diarrhea since she has been in the ED. -GI pathogen panel d/c'd as no further issues  CAD status post CABG on 11/04/2018  and post CABG pericarditis, pleuritis, and pleural effusion Chest x-ray showing small left pleural effusionEKG showing STabnormality in inferior and lateral leads. ST abnormality in lateral leads seen on prior tracing from 11/24/2018 as well.Patient is not endorsing chest pain. -Cardiac monitoring -Continue aspirin, Plavix, metoprolol, Isordil, Lipitor  Abnormal CXR Chest x-ray showingworsening left lower lobe atelectasis or infiltrate. Pneumonia less likely given no fever or leukocytosis.No signs of respiratory distress. procalcitonin level negative -incentive spirometry -no hypoxia  Depression -Continue Prozac Body mass index is 24.25 kg/m.    Nutrition Interventions:       Pain control  - [place] Controlled Substance Reporting System database was reviewed. - 0 day supply was provided. - Patient was instructed, not to drive, operate heavy machinery, perform activities at heights, swimming or participation in water activities or provide baby sitting services while on Pain, Sleep and Anxiety Medications; until her outpatient Physician has advised to do so again.  - Also recommended to not to take more than prescribed Pain, Sleep and Anxiety Medications.  Patient was  ambulatory without any assistance.  Consultants: n/a Procedures: n/a  DISCHARGE MEDICATION: Allergies as of 12/02/2018      Reactions   Crestor [rosuvastatin Calcium] Other (See Comments)   Severe myalgias and joint pain. Has tolerated atorvastatin though   Peanut-containing Drug Products Other (See Comments)   Vomiting, upset stomach and some wheezing   Ciprofloxin Hcl [ciprofloxacin] Nausea And Vomiting, Other (See Comments)   Severe migraine   Food Color Red [red Dye] Diarrhea      Medication List    STOP taking these medications   ibuprofen 400 MG tablet Commonly known as: ADVIL     TAKE these medications   albuterol 108 (90 Base) MCG/ACT inhaler Commonly known as: VENTOLIN  HFA Inhale 2 puffs into the lungs every 6 (six) hours as needed for wheezing or shortness of breath.   aspirin 81 MG EC tablet Take 1 tablet (81 mg total) by mouth daily.   atorvastatin 80 MG tablet Commonly known as: LIPITOR Take 1 tablet (80 mg total) by mouth daily at 6 PM.   clopidogrel 75 MG tablet Commonly known as: PLAVIX Take 1 tablet (75 mg total) by mouth daily with breakfast. Please keep upcoming appt in November for future refills. Thank you   colchicine 0.6 MG tablet Take 1 tablet (0.6 mg total) by mouth 2 (two) times daily.   FLUoxetine 40 MG capsule Commonly known as: PROzac Take 1 capsule (40 mg total) by mouth daily.   furosemide 20 MG tablet Commonly known as: LASIX Take 1 tablet (20 mg total) by mouth daily.   insulin aspart 100 UNIT/ML injection Commonly known as: novoLOG 35 units per insulin pump daily What changed:   how much to take  how to take this  when to take this  additional instructions   ipratropium 0.06 % nasal spray Commonly known as: Atrovent Place 2 sprays into both nostrils 4 (four) times daily. What changed: when to take this   isosorbide dinitrate 5 MG tablet Commonly known as: ISORDIL Take 1 tablet (5 mg total) by mouth 3 (three) times daily.   levothyroxine 100 MCG tablet Commonly known as: Euthyrox Take 1 tablet (100 mcg total) by  mouth daily before breakfast. Must have office visit for refills   metoCLOPramide 5 MG tablet Commonly known as: Reglan Take 1 tablet (5 mg total) by mouth every 12 (twelve) hours as needed for nausea or vomiting.   metoprolol tartrate 25 MG tablet Commonly known as: LOPRESSOR Take 0.5 tablets (12.5 mg total) by mouth 2 (two) times daily. Hold for SBP<90 and HR<50   montelukast 10 MG tablet Commonly known as: SINGULAIR TAKE 1 TABLET BY MOUTH AT BEDTIME   nitroGLYCERIN 0.4 MG SL tablet Commonly known as: NITROSTAT Place 1 tablet (0.4 mg total) under the tongue every 5 (five) minutes as  needed for chest pain.   ondansetron 4 MG disintegrating tablet Commonly known as: Zofran ODT Take 1 tablet (4 mg total) by mouth every 8 (eight) hours as needed for nausea or vomiting.   pantoprazole 40 MG tablet Commonly known as: Protonix Take 1 tablet (40 mg total) by mouth 2 (two) times daily.   PHAZYME PO Take 1 tablet by mouth as needed (upset stomach).   sucralfate 1 GM/10ML suspension Commonly known as: Carafate Take 10 mLs (1 g total) by mouth 2 (two) times daily for 30 days. What changed:   when to take this  reasons to take this   traMADol 50 MG tablet Commonly known as: ULTRAM Take 1-2 tablets (50-100 mg total) by mouth every 4 (four) hours as needed for moderate pain.      Allergies  Allergen Reactions   Crestor [Rosuvastatin Calcium] Other (See Comments)    Severe myalgias and joint pain. Has tolerated atorvastatin though   Peanut-Containing Drug Products Other (See Comments)    Vomiting, upset stomach and some wheezing   Ciprofloxin Hcl [Ciprofloxacin] Nausea And Vomiting and Other (See Comments)    Severe migraine   Food Color Red [Red Dye] Diarrhea   Discharge Instructions    Call MD for:  persistant nausea and vomiting   Complete by: As directed    Diet - low sodium heart healthy   Complete by: As directed    Increase activity slowly   Complete by: As directed      Discharge Exam: Filed Weights   11/30/18 0452 12/01/18 0500 12/02/18 0058  Weight: 66.2 kg 62.8 kg 66.1 kg   Vitals:   12/02/18 0103 12/02/18 0500  BP: 108/73 133/79  Pulse: 77 88  Resp: 18 18  Temp:  99 F (37.2 C)  SpO2:  96%   Physical Exam on D/C:   Constitutional: She is oriented to person, place, and time. She appears well-developed and well-nourished. No distress.  HENT:  Head: Normocephalic.  Moist mucus membrane  Eyes: Right eye exhibits no discharge. Left eye exhibits no discharge.  Neck: Neck supple.  Cardiovascular: Regular Rate rhythm and intact distal  pulses.  Pulmonary/Chest: Effort normal and breath sounds normal. No respiratory distress. She has no wheezes. She has no rales.  Abdominal: Soft. Bowel sounds are normal. She exhibits no distension.  Nontender. There is no rebound and no guarding.  Musculoskeletal:        General: No edema.  Neurological: She is alert and oriented to person, place, and time.  Skin: Skin is warm and dry. She is not diaphoretic.   The results of significant diagnostics from this hospitalization (including imaging, microbiology, ancillary and laboratory) are listed below for reference.    Significant Diagnostic Studies: Dg Chest 2 View  Result Date: 11/15/2018 CLINICAL DATA:  Patient status post CABG 11/04/2018. EXAM: CHEST - 2 VIEW  COMPARISON:  PA and lateral chest 11/08/2018 and 11/03/2018. FINDINGS: Median sternotomy wires are unchanged. Lungs are clear. No pneumothorax. Tiny bilateral pleural effusions are present. No acute or focal bony abnormality. IMPRESSION: Tiny bilateral pleural effusions.  Otherwise negative. Electronically Signed   By: Inge Rise M.D.   On: 11/15/2018 12:25   Dg Chest 2 View  Result Date: 11/08/2018 CLINICAL DATA:  Status post cardiac surgery EXAM: CHEST - 2 VIEW COMPARISON:  11/06/2018 FINDINGS: Interval removal of a left-sided chest tube. No significant pneumothorax. Improved aeration of the lungs and near complete resolution of a trace left pleural effusion. No acute appearing airspace opacity. The heart and mediastinum are normal in size status post median sternotomy. Disc degenerative disease of the thoracic spine IMPRESSION: 1. Interval removal of a left-sided chest tube. No significant pneumothorax. 2. Improved aeration of the lungs and near complete resolution of a trace left pleural effusion. No acute appearing airspace opacity. Electronically Signed   By: Eddie Candle M.D.   On: 11/08/2018 09:30   Dg Chest 2 View  Result Date: 11/03/2018 CLINICAL DATA:  Pre-surgical  assessment for CABG EXAM: CHEST - 2 VIEW COMPARISON:  10/29/2018 FINDINGS: The heart size and mediastinal contours are within normal limits. Both lungs are clear. Mild degenerative changes of the midthoracic spine. IMPRESSION: No active cardiopulmonary disease. Electronically Signed   By: Donavan Foil M.D.   On: 11/03/2018 20:55   Ct Angio Chest Pe W Or Wo Contrast  Result Date: 11/24/2018 CLINICAL DATA:  Shortness of breath.  Possible pulmonary embolus. EXAM: CT ANGIOGRAPHY CHEST WITH CONTRAST TECHNIQUE: Multidetector CT imaging of the chest was performed using the standard protocol during bolus administration of intravenous contrast. Multiplanar CT image reconstructions and MIPs were obtained to evaluate the vascular anatomy. CONTRAST:  3mL OMNIPAQUE IOHEXOL 350 MG/ML SOLN COMPARISON:  Chest x-ray November 24, 2018 FINDINGS: Cardiovascular: Coronary artery calcifications are identified. The patient is status post CABG. The thoracic aorta is nonaneurysmal with no dissection. No pulmonary emboli identified. Mediastinum/Nodes: Moderate bilateral pleural effusions are identified. No significant pericardial effusion identified. Increased attenuation in the pericardial fat is likely due to recent CABG. The esophagus and thyroid are normal. The chest wall including the breasts are unremarkable. No adenopathy identified. Lungs/Pleura: Central airways are normal. No pneumothorax. Moderate bilateral pleural effusions with atelectasis are identified. No suspicious infiltrates are identified to suggest pneumonia. No pulmonary nodules or masses. Upper Abdomen: No acute abnormality. Musculoskeletal: No chest wall abnormality. No acute or significant osseous findings. Review of the MIP images confirms the above findings. IMPRESSION: 1. No pulmonary emboli identified. 2. Recent CABG.  Coronary artery calcifications. 3. Moderate bilateral pleural effusions with atelectasis. Electronically Signed   By: Dorise Bullion III M.D    On: 11/24/2018 14:27   Ct Abdomen Pelvis W Contrast  Result Date: 11/24/2018 CLINICAL DATA:  Mid to upper abdominal pain and vomiting today. EXAM: CT ABDOMEN AND PELVIS WITH CONTRAST TECHNIQUE: Multidetector CT imaging of the abdomen and pelvis was performed using the standard protocol following bolus administration of intravenous contrast. CONTRAST:  182mL OMNIPAQUE IOHEXOL 300 MG/ML  SOLN COMPARISON:  12/04/2017 FINDINGS: Lower chest: Moderate-sized right pleural effusion with compressive atelectasis of the right lower lobe. Small left pleural effusion. Hepatobiliary: No focal liver abnormality is seen. No gallstones, gallbladder wall thickening, or biliary dilatation. Pancreas: Unremarkable. No pancreatic ductal dilatation or surrounding inflammatory changes. Spleen: Normal in size without focal abnormality. Adrenals/Urinary Tract: Adrenal glands are unremarkable. Kidneys are normal, without renal calculi,  focal lesion, or hydronephrosis. Bladder is distended, extending almost to the level of the umbilicus. Stomach/Bowel: Unremarkable stomach, small bowel and colon. Surgically absent appendix. Vascular/Lymphatic: No significant vascular findings are present. No enlarged abdominal or pelvic lymph nodes. Reproductive: Uterus and bilateral adnexa are unremarkable. Other: No abdominal wall hernia or abnormality. No abdominopelvic ascites. Musculoskeletal: Lumbar spine degenerative changes. IMPRESSION: 1. No acute abnormality. 2. Moderate-sized right pleural effusion with compressive atelectasis of the right lower lobe. 3. Small left pleural effusion. 4. Distended urinary bladder, similar to the previous examination. Electronically Signed   By: Claudie Revering M.D.   On: 11/24/2018 04:35   Dg Chest Portable 1 View  Result Date: 11/28/2018 CLINICAL DATA:  Abdominal pain EXAM: PORTABLE CHEST 1 VIEW COMPARISON:  11/24/2018 FINDINGS: Prior CABG. Heart is normal size. Small left pleural effusion with left lower lobe  atelectasis or infiltrate, worsening since prior study. Right lung clear. No acute bony abnormality. IMPRESSION: Small left pleural effusion. Worsening left lower lobe atelectasis or infiltrate. Electronically Signed   By: Rolm Baptise M.D.   On: 11/28/2018 20:06   Dg Chest Port 1 View  Result Date: 11/24/2018 CLINICAL DATA:  Vomiting and epigastric pain. EXAM: PORTABLE CHEST 1 VIEW COMPARISON:  November 15, 2018 FINDINGS: No pneumothorax. The cardiomediastinal silhouette is stable. Minimal atelectasis in the left base/lingula is stable in the interval. No other acute interval changes. IMPRESSION: Mild atelectasis in the lingula or left lower lobe is stable. No other changes. Electronically Signed   By: Dorise Bullion III M.D   On: 11/24/2018 10:35   Dg Chest Port 1 View  Result Date: 11/06/2018 CLINICAL DATA:  Status post cardiac surgery. EXAM: PORTABLE CHEST 1 VIEW COMPARISON:  November 05, 2018. FINDINGS: Stable cardiomegaly. Bilateral chest tubes are noted without pneumothorax. Right internal jugular Swan-Ganz catheter has been removed. No pneumothorax or significant pleural effusion is noted. Right lung is clear. Mild bibasilar subsegmental atelectasis is noted. Bony thorax is unremarkable. IMPRESSION: Stable bilateral chest tubes without pneumothorax. Mild bibasilar subsegmental atelectasis. Electronically Signed   By: Marijo Conception M.D.   On: 11/06/2018 07:26   Dg Chest Port 1 View  Result Date: 11/05/2018 CLINICAL DATA:  Chest tube, CABG EXAM: PORTABLE CHEST 1 VIEW COMPARISON:  11/04/2018 FINDINGS: Interval removal of endotracheal tube. Swan-Ganz catheter and left chest tube remain in sclerotic that Swan-Ganz catheter and bilateral chest tubes remain in place, unchanged. No pneumothorax. Bibasilar atelectasis and suspected small layering effusions. Mild cardiomegaly. Left upper lobe perihilar atelectasis, stable. IMPRESSION: Interval extubation. Bilateral chest tubes remain in stable position.  No pneumothorax. Areas of atelectasis bilaterally.  Suspect small layering effusions. Electronically Signed   By: Rolm Baptise M.D.   On: 11/05/2018 08:23   Dg Chest Port 1 View  Result Date: 11/04/2018 CLINICAL DATA:  Status post CABG EXAM: PORTABLE CHEST 1 VIEW COMPARISON:  November 03, 2018 FINDINGS: The ETT terminates 12 mm above the carina. Recommend withdrawing 1.5 cm. The PA catheter is in good position. Chest tubes and mediastinal drain are stable. There is a tiny left apical pneumothorax. Opacity in the left base is likely a small effusion and atelectasis. Opacity adjacent to the distal left chest tube may represent atelectasis as well. No other acute abnormalities. IMPRESSION: 1. The ETT terminates 1.2 cm above the carina. Recommend withdrawing 1.5 cm. 2. Other support apparatus as above. There is a small left apical pneumothorax with a left chest tube in place. 3. Opacity in left base is likely atelectasis. There  is a small associated effusion. 4. No other acute abnormalities. Electronically Signed   By: Dorise Bullion III M.D   On: 11/04/2018 14:08    Microbiology: Recent Results (from the past 240 hour(s))  SARS CORONAVIRUS 2 (TAT 6-24 HRS) Nasopharyngeal Nasopharyngeal Swab     Status: None   Collection Time: 11/24/18  5:55 AM   Specimen: Nasopharyngeal Swab  Result Value Ref Range Status   SARS Coronavirus 2 NEGATIVE NEGATIVE Final    Comment: (NOTE) SARS-CoV-2 target nucleic acids are NOT DETECTED. The SARS-CoV-2 RNA is generally detectable in upper and lower respiratory specimens during the acute phase of infection. Negative results do not preclude SARS-CoV-2 infection, do not rule out co-infections with other pathogens, and should not be used as the sole basis for treatment or other patient management decisions. Negative results must be combined with clinical observations, patient history, and epidemiological information. The expected result is Negative. Fact Sheet for  Patients: SugarRoll.be Fact Sheet for Healthcare Providers: https://www.woods-mathews.com/ This test is not yet approved or cleared by the [place] FDA and  has been authorized for detection and/or diagnosis of SARS-CoV-2 by FDA under an Emergency Use Authorization (EUA). This EUA will remain  in effect (meaning this test can be used) for the duration of the COVID-19 declaration under Section 56 4(b)(1) of the Act, 21 U.S.C. section 360bbb-3(b)(1), unless the authorization is terminated or revoked sooner. Performed at [facility], [place] [address], [place], [place] [address]   MRSA PCR Screening     Status: None   Collection Time: 11/24/18  7:04 AM   Specimen: Nasal Mucosa; Nasopharyngeal  Result Value Ref Range Status   MRSA by PCR NEGATIVE NEGATIVE Final    Comment:        The GeneXpert MRSA Assay (FDA approved for NASAL specimens only), is one component of a comprehensive MRSA colonization surveillance program. It is not intended to diagnose MRSA infection nor to guide or monitor treatment for MRSA infections. Performed at [facility], [place] [address] [place], [place] [address]   Culture, Urine     Status: None   Collection Time: 11/24/18  8:45 AM   Specimen: Urine, Clean Catch  Result Value Ref Range Status   Specimen Description URINE, CLEAN CATCH  Final   Special Requests NONE  Final   Culture   Final    NO GROWTH Performed at [facility], [place] [address], [place], [place] [address]    Report Status 11/25/2018 FINAL  Final  Culture, blood (routine x 2)     Status: None   Collection Time: 11/24/18 10:15 AM   Specimen: BLOOD LEFT ARM  Result Value Ref Range Status   Specimen Description BLOOD LEFT ARM  Final   Special Requests   Final    BOTTLES DRAWN AEROBIC ONLY Blood Culture adequate volume   Culture   Final    NO GROWTH 5 DAYS Performed at [facility], [place] [address], [place], [place]  [address]    Report Status 11/29/2018 FINAL  Final  Culture, blood (routine x 2)     Status: None   Collection Time: 11/24/18 10:27 AM   Specimen: BLOOD LEFT HAND  Result Value Ref Range Status   Specimen Description BLOOD LEFT HAND  Final   Special Requests   Final    BOTTLES DRAWN AEROBIC ONLY Blood Culture results may not be optimal due to an inadequate volume of blood received in culture bottles  Culture   Final    NO GROWTH 5 DAYS Performed at [facility], [place] [address] [place], [place] [address]    Report Status 11/29/2018 FINAL  Final  SARS CORONAVIRUS 2 (TAT 6-24 HRS) Nasopharyngeal     Status: None   Collection Time: 11/28/18  8:39 PM   Specimen: Nasopharyngeal  Result Value Ref Range Status   SARS Coronavirus 2 NEGATIVE NEGATIVE Final    Comment: (NOTE) SARS-CoV-2 target nucleic acids are NOT DETECTED. The SARS-CoV-2 RNA is generally detectable in upper and lower respiratory specimens during the acute phase of infection. Negative results do not preclude SARS-CoV-2 infection, do not rule out co-infections with other pathogens, and should not be used as the sole basis for treatment or other patient management decisions. Negative results must be combined with clinical observations, patient history, and epidemiological information. The expected result is Negative. Fact Sheet for Patients: SugarRoll.be Fact Sheet for Healthcare Providers: https://www.woods-mathews.com/ This test is not yet approved or cleared by the [place] FDA and  has been authorized for detection and/or diagnosis of SARS-CoV-2 by FDA under an Emergency Use Authorization (EUA). This EUA will remain  in effect (meaning this test can be used) for the duration of the COVID-19 declaration under Section 56 4(b)(1) of the Act, 21 U.S.C. section 360bbb-3(b)(1), unless the authorization is terminated or revoked sooner. Performed at [facility],  [place] [address] [place], [place] [address]      Labs: CBC: Recent Labs  Lab 11/26/18 0206 11/28/18 1455 12/01/18 0411 12/02/18 0522  WBC 8.9 10.2 7.4 8.1  HGB 10.4* 10.7* 11.0* 10.3*  HCT 32.9* 33.0* 33.8* 31.6*  MCV 94.5 91.9 91.8 92.9  PLT 659* 593* 616* [id number]*   Basic Metabolic Panel: Recent Labs  Lab 11/28/18 1455 11/29/18 0022 11/30/18 0948 12/01/18 0411 12/02/18 0522  NA 135 136 136 138 137  K 3.9 4.8 3.0* 4.0 3.4*  CL 103 106 103 104 105  CO2 17* 20* 19* 24 22  GLUCOSE 303* 247* 259* 208* 144*  BUN 9 9 6 8  5*  CREATININE 0.84 0.73 0.89 0.94 0.81  CALCIUM 8.4* 8.3* 8.5* 8.4* 8.5*   Liver Function Tests: Recent Labs  Lab 11/28/18 1455  AST 20  ALT 14  ALKPHOS 92  BILITOT 0.9  PROT 7.1  ALBUMIN 3.1*   Recent Labs  Lab 11/28/18 1455  LIPASE 19   No results for input(s): AMMONIA in the last 168 hours. Cardiac Enzymes: No results for input(s): CKTOTAL, CKMB, CKMBINDEX, TROPONINI in the last 168 hours. BNP (last 3 results) No results for input(s): BNP in the last 8760 hours. CBG: Recent Labs  Lab 12/01/18 1957 12/01/18 2354 12/02/18 0456 12/02/18 0722 12/02/18 1140  GLUCAP 227* 181* 129* 129* 177*    Time spent: 35 minutes  Signed:  Thornell Mule  [facility]  12/02/2018 12:06 PM

## 2018-12-02 NOTE — Plan of Care (Signed)
  Problem: Clinical Measurements: Goal: Will remain free from infection Outcome: Completed/Met Goal: Respiratory complications will improve Outcome: Completed/Met   Problem: Activity: Goal: Risk for activity intolerance will decrease Outcome: Completed/Met   Problem: Nutrition: Goal: Adequate nutrition will be maintained Outcome: Completed/Met   Problem: Coping: Goal: Level of anxiety will decrease Outcome: Completed/Met   Problem: Elimination: Goal: Will not experience complications related to bowel motility Outcome: Completed/Met Goal: Will not experience complications related to urinary retention Outcome: Completed/Met   Problem: Pain Managment: Goal: General experience of comfort will improve Outcome: Completed/Met   Problem: Safety: Goal: Ability to remain free from injury will improve Outcome: Completed/Met   Problem: Skin Integrity: Goal: Risk for impaired skin integrity will decrease Outcome: Completed/Met

## 2018-12-03 ENCOUNTER — Telehealth: Payer: Self-pay

## 2018-12-03 NOTE — Telephone Encounter (Signed)
Transition Care Management Follow-up Telephone Call Date of discharge and from where: 12/02/2018, [facility]   Call placed to # [phone], message left requesting a call back to this CM # [phone]

## 2018-12-04 ENCOUNTER — Telehealth: Payer: Self-pay

## 2018-12-04 NOTE — Telephone Encounter (Signed)
Transition Care Management Follow-up Telephone Call - attempt # 2 Date of discharge and from where: 12/02/2018, [facility].  Call placed to patient # [phone], message left with call back requested to this CM # [phone]

## 2018-12-06 ENCOUNTER — Other Ambulatory Visit: Payer: Self-pay | Admitting: Cardiothoracic Surgery

## 2018-12-06 DIAGNOSIS — I25119 Atherosclerotic heart disease of native coronary artery with unspecified angina pectoris: Secondary | ICD-10-CM

## 2018-12-09 ENCOUNTER — Ambulatory Visit (INDEPENDENT_AMBULATORY_CARE_PROVIDER_SITE_OTHER): Payer: Self-pay | Admitting: Surgical

## 2018-12-09 ENCOUNTER — Ambulatory Visit
Admission: RE | Admit: 2018-12-09 | Discharge: 2018-12-09 | Disposition: A | Discharge status: Home / Self Care | Payer: Self-pay | Source: Ambulatory Visit | Attending: Cardiothoracic Surgery | Admitting: Cardiothoracic Surgery

## 2018-12-09 ENCOUNTER — Other Ambulatory Visit: Payer: Self-pay

## 2018-12-09 VITALS — BP 103/68 | HR 99 | Temp 97.7°F | Resp 16 | Ht 65.0 in | Wt 150.0 lb

## 2018-12-09 DIAGNOSIS — I25119 Atherosclerotic heart disease of native coronary artery with unspecified angina pectoris: Secondary | ICD-10-CM

## 2018-12-09 DIAGNOSIS — Z951 Presence of aortocoronary bypass graft: Secondary | ICD-10-CM

## 2018-12-09 DIAGNOSIS — J90 Pleural effusion, not elsewhere classified: Secondary | ICD-10-CM | POA: Insufficient documentation

## 2018-12-09 MED ORDER — FUROSEMIDE 40 MG PO TABS: 40.0000 mg | ORAL_TABLET | Freq: Every day | ORAL | 0 refills | Status: DC

## 2018-12-09 MED ORDER — POTASSIUM CHLORIDE ER 10 MEQ PO TBCR: 20.0000 meq | EXTENDED_RELEASE_TABLET | Freq: Every day | ORAL | 0 refills | Status: DC

## 2018-12-09 NOTE — Progress Notes (Signed)
[address]       ,[place] [address]             [phone]      Amiliah A Pritz [place] Medical Record [id number] Date of Birth: [birth year]  Referring: [name], MD Primary Care: [name], NP Primary Cardiologist: Fransico Him, MD   Chief Complaint:   POST OP FOLLOW UP         CARDIOTHORACIC SURGERY OPERATIVE NOTE  Date of Procedure:    11/04/2018  Preoperative Diagnosis:      Severe 2-vessel Coronary Artery Disease  Postoperative Diagnosis:    Same  Procedure:        Coronary Artery Bypass Grafting x 3              Left Internal Mammary Artery to Distal Left Anterior Descending Coronary and Ramus intermedius Artery as sequenced graft; pedicled Right IMA to  Obtuse Marginal Branch of Left Circumflex Coronary Artery; bilateral internal mammary artery harvesting; completion graft surveillance with indocyanine green fluorescence imaging (SPY); application of Prevena incisional management system  Surgeon:        B. Murvin Natal, MD  Assistant:       Josie Saunders PA-C  Anesthesia:    get  Operative Findings: ? Mildly depressed left ventricular systolic function ? good quality internal mammary artery conduits ? good quality target vessels for grafting        History of Present Illness:    The patient is a 48 year old female status post the above described procedure seen in the office on today's date and routine postsurgical follow-up.  She has had a difficult time post surgery with 2 admissions.  She did require admission for an episode of DKA and a second short admission for intractable nausea and vomiting.  She has been treated for postoperative pericardiotomy syndrome with colchicine but she is not currently taking this.  Today she reports that she is feeling fairly well.  She denies any significant constitutional symptoms at this time.  She is somewhat weak and ambulation is slow in its progress.  She denies chest  pain or shortness of breath.  She is not currently having any difficulty with her incisions.  She has started to drive short durations around town.  Overall she is feeling reasonably well and is pleased with her progress.      Past Medical History:  Diagnosis Date   Anemia    Anxiety    Coronary artery disease    a. s/pPTCA DES x3 in the LAD (ostial DES placed, mid DES placed, distal DES placed)and PTCA/DES x1to intermediate branch 02/2017. b. ACS 02/08/18 with LCx stent placement.   Depression    Diabetes mellitus    type 1, on insulin pump   DKA (diabetic ketoacidoses) ([place]) 12/31/2017   Elevated platelet count    High cholesterol    Hypothyroidism    Ischemic cardiomyopathy    a. EF low-normal with basal inferior akinesis, basal septal hypokinesis by echo 01/2018.     Social History   Tobacco Use  Smoking Status Never Smoker  Smokeless Tobacco Never Used  Tobacco Comment   Use marijuana    Social History   Substance and Sexual Activity  Alcohol Use No   Alcohol/week: 0.0 standard drinks     Allergies  Allergen Reactions   Crestor [Rosuvastatin Calcium] Other (See Comments)    Severe myalgias and joint pain. Has tolerated atorvastatin though  Peanut-Containing Drug Products Other (See Comments)    Vomiting, upset stomach and some wheezing   Ciprofloxin Hcl [Ciprofloxacin] Nausea And Vomiting and Other (See Comments)    Severe migraine   Food Color Red [Red Dye] Diarrhea    Current Outpatient Medications  Medication Sig Dispense Refill   albuterol (PROVENTIL HFA;VENTOLIN HFA) 108 (90 Base) MCG/ACT inhaler Inhale 2 puffs into the lungs every 6 (six) hours as needed for wheezing or shortness of breath. 1 Inhaler 0   aspirin 81 MG EC tablet Take 1 tablet (81 mg total) by mouth daily. 90 tablet 0   atorvastatin (LIPITOR) 80 MG tablet Take 1 tablet (80 mg total) by mouth daily at 6 PM. 30 tablet 3   clopidogrel (PLAVIX) 75 MG tablet Take 1 tablet  (75 mg total) by mouth daily with breakfast. Please keep upcoming appt in November for future refills. Thank you 90 tablet 0   colchicine 0.6 MG tablet Take 1 tablet (0.6 mg total) by mouth 2 (two) times daily. 60 tablet 0   FLUoxetine (PROZAC) 40 MG capsule Take 1 capsule (40 mg total) by mouth daily. 30 capsule 2   furosemide (LASIX) 20 MG tablet Take 1 tablet (20 mg total) by mouth daily. 7 tablet 0   insulin aspart (NOVOLOG) 100 UNIT/ML injection 35 units per insulin pump daily (Patient taking differently: Inject 35 Units into the skin daily. insulin pump daily) 10 mL 11   ipratropium (ATROVENT) 0.06 % nasal spray Place 2 sprays into both nostrils 4 (four) times daily. (Patient taking differently: Place 2 sprays into both nostrils 2 (two) times a day. ) 15 mL 0   isosorbide dinitrate (ISORDIL) 5 MG tablet Take 1 tablet (5 mg total) by mouth 3 (three) times daily. 90 tablet 3   levothyroxine (EUTHYROX) 100 MCG tablet Take 1 tablet (100 mcg total) by mouth daily before breakfast. Must have office visit for refills 30 tablet 0   metoCLOPramide (REGLAN) 5 MG tablet Take 1 tablet (5 mg total) by mouth every 12 (twelve) hours as needed for nausea or vomiting. 90 tablet 1   metoprolol tartrate (LOPRESSOR) 25 MG tablet Take 0.5 tablets (12.5 mg total) by mouth 2 (two) times daily. Hold for SBP<90 and HR<50 60 tablet 11   montelukast (SINGULAIR) 10 MG tablet TAKE 1 TABLET BY MOUTH AT BEDTIME (Patient taking differently: Take 10 mg by mouth at bedtime. ) 90 tablet 0   nitroGLYCERIN (NITROSTAT) 0.4 MG SL tablet Place 1 tablet (0.4 mg total) under the tongue every 5 (five) minutes as needed for chest pain. 30 tablet 0   ondansetron (ZOFRAN ODT) 4 MG disintegrating tablet Take 1 tablet (4 mg total) by mouth every 8 (eight) hours as needed for nausea or vomiting. 20 tablet 0   pantoprazole (PROTONIX) 40 MG tablet Take 1 tablet (40 mg total) by mouth 2 (two) times daily. 30 tablet 1   Simethicone  (PHAZYME PO) Take 1 tablet by mouth as needed (upset stomach).     sucralfate (CARAFATE) 1 GM/10ML suspension Take 10 mLs (1 g total) by mouth 2 (two) times daily for 30 days. (Patient taking differently: Take 1 g by mouth as needed (nausea and vomiting). ) 600 mL 0   traMADol (ULTRAM) 50 MG tablet Take 1-2 tablets (50-100 mg total) by mouth every 4 (four) hours as needed for moderate pain. 30 tablet 0   No current facility-administered medications for this visit.        Physical Exam: BP 103/68 (  BP Location: Right Arm, Patient Position: Sitting, Cuff Size: Normal)    Pulse 99    Temp 97.7 F (36.5 C)    Resp 16    Ht 5\' 5"  (1.651 m)    Wt 150 lb (68 kg)    LMP 11/12/2018    SpO2 97% Comment: RA   BMI 24.96 kg/m   General appearance: alert, cooperative and no distress Heart: regular rate and rhythm Lungs: Diminished in the left base Abdomen: Benign exam Extremities: No edema Wound: Incisions well-healed without evidence of infection   Diagnostic Studies & Laboratory data:     Recent Radiology Findings:   Dg Chest 2 View  Result Date: 12/09/2018 CLINICAL DATA:  48 year old female with recent heart surgery. EXAM: CHEST - 2 VIEW COMPARISON:  Chest radiograph dated 11/28/2018. FINDINGS: Interval increase in the size of the left pleural effusion with progression of left lung base consolidation representing atelectasis or infiltrate. The right lung remains clear. There is no pneumothorax. The cardiac silhouette is within normal limits. Median sternotomy wires and CABG vascular clips. No acute osseous pathology. IMPRESSION: Interval increase in the size of the left pleural effusion and left lung base consolidation. Electronically Signed   By: Anner Crete M.D.   On: 12/09/2018 13:20      Recent Lab Findings: Lab Results  Component Value Date   WBC 8.1 12/02/2018   HGB 10.3 (L) 12/02/2018   HCT 31.6 (L) 12/02/2018   PLT 611 (H) 12/02/2018   GLUCOSE 144 (H) 12/02/2018   CHOL 215  (H) 07/23/2018   TRIG 157 (H) 07/23/2018   HDL 63 07/23/2018   LDLCALC 121 (H) 07/23/2018   ALT 14 11/28/2018   AST 20 11/28/2018   NA 137 12/02/2018   K 3.4 (L) 12/02/2018   CL 105 12/02/2018   CREATININE 0.81 12/02/2018   BUN 5 (L) 12/02/2018   CO2 22 12/02/2018   TSH 17.338 (H) 12/01/2018   INR 1.4 (H) 11/04/2018   HGBA1C 10.2 (H) 11/02/2018      Assessment / Plan: The patient is overall doing well status post CABG x3 on 11/04/2018 by Dr. Orvan Seen.  She has a good understanding that long-term primarily she has to manage her comorbidities and in particular her diabetes as well as possible.  She does have an insulin pump.  She did have that episode of DKA requiring hospitalization but feels as though her appetite and dietary intake is improving over time.  Her chest x-ray shows a small left-sided effusion.  We will give her a prescription for Lasix 40 mg daily and potassium chloride 20 mEq daily for 10 days.  We will obtain a repeat chest x-ray in 2 weeks and follow-up at that time as she could potentially require thoracentesis if the effusion increases.  I am not comfortable currently with prescribing steroids due to her significant comorbidities.  I do want to risk healing issues with bilateral mammaries as well.  I have made no other changes to her medical regimen at this time.      Medication Changes: No orders of the defined types were placed in this encounter.     John Giovanni, PA-C 12/09/2018 1:26 PM

## 2018-12-09 NOTE — Patient Instructions (Signed)
Given verbal instructions in regard to activity progression as well as driving.

## 2018-12-10 ENCOUNTER — Telehealth: Payer: Self-pay

## 2018-12-10 ENCOUNTER — Encounter: Payer: Self-pay | Admitting: Physician Assistant

## 2018-12-10 NOTE — Telephone Encounter (Signed)
Transition Care Management Follow-up Telephone Call Date of discharge and from where: 12/02/2018, [facility]   Attempted again to contact the patient # [phone], message left requesting a call back to this CM # [phone].  Patient has a hospital follow up  appointment scheduled at [facility] 12/17/2018

## 2018-12-11 ENCOUNTER — Telehealth: Payer: Self-pay | Admitting: Endocrinology

## 2018-12-11 ENCOUNTER — Other Ambulatory Visit: Payer: Self-pay

## 2018-12-11 ENCOUNTER — Telehealth: Payer: Self-pay

## 2018-12-11 DIAGNOSIS — E119 Type 2 diabetes mellitus without complications: Secondary | ICD-10-CM

## 2018-12-11 MED ORDER — INSULIN ASPART 100 UNIT/ML ~~LOC~~ SOLN: SUBCUTANEOUS | 0 refills | Status: DC

## 2018-12-11 NOTE — Telephone Encounter (Signed)
insulin aspart (NOVOLOG) 100 UNIT/ML injection 10 mL 0 12/11/2018    Sig: 35 units per insulin pump daily   Sent to pharmacy as: insulin aspart (NOVOLOG) 100 UNIT/ML injection   E-Prescribing Status: Receipt confirmed by pharmacy (12/11/2018 3:21 PM EST)

## 2018-12-11 NOTE — Telephone Encounter (Signed)
Per Dr. Loanne Drilling, unable to refill Humalog without an appt. Routing this message to the front desk for scheduling purposes.

## 2018-12-11 NOTE — Telephone Encounter (Signed)
1.  Please schedule f/u appt 2.  Then please refill x 1, pending that appt.  

## 2018-12-11 NOTE — Telephone Encounter (Signed)
Please advise 

## 2018-12-11 NOTE — Telephone Encounter (Signed)
MEDICATION: insulin aspart (NOVOLOG) 100 UNIT/ML injection  PHARMACY:  [place], [place] [address]  IS THIS A 90 DAY SUPPLY :   IS PATIENT OUT OF MEDICATION:   IF NOT; HOW MUCH IS LEFT:   LAST APPOINTMENT DATE: @11 /18/2020  NEXT APPOINTMENT DATE:@12 /17/2020 not found  DO WE HAVE YOUR PERMISSION TO LEAVE A DETAILED MESSAGE:  OTHER COMMENTS:    **Let patient know to contact pharmacy at the end of the day to make sure medication is ready. **  ** Please notify patient to allow 48-72 hours to process**  **Encourage patient to contact the pharmacy for refills or they can request refills through [facility]**

## 2018-12-12 MED FILL — ?HUMALOG 100 UNITS/ML VIAL: 100 | 28 days supply | Qty: 10 | Fill #0

## 2018-12-12 NOTE — Telephone Encounter (Signed)
Patient was scheduled for a follow up appointment on 01/09/2019 1:00 PM with Dr Loanne Drilling  prior to refill being requested.

## 2018-12-16 ENCOUNTER — Other Ambulatory Visit: Payer: Self-pay | Admitting: Cardiothoracic Surgery

## 2018-12-16 DIAGNOSIS — I25119 Atherosclerotic heart disease of native coronary artery with unspecified angina pectoris: Secondary | ICD-10-CM

## 2018-12-16 NOTE — Progress Notes (Signed)
[name], female   DOB: Jan 29, 1970, 48 y.o.   MRN: [id number]  Virtual Visit via Telephone Note  I connected with Jennifer Hogan on 12/16/18 at  9:10 AM EST by telephone and verified that I am speaking with the correct person using two identifiers.   I discussed the limitations, risks, security and privacy concerns of performing an evaluation and management service by telephone and the availability of in person appointments. I also discussed with the patient that there may be a patient responsible charge related to this service. The patient expressed understanding and agreed to proceed.  Patient location:  home My Location:  [place] office Persons on the call:      History of Present Illness: Blood sugars running in the 250s.  She has insulin pump.  Needs metoclopramide and zofran sent bc she didn't get them after hospitalization.  She also needs a Rf of albuterol and wants referral to pulmonology bc esp when weather changes she finds that she heavily relies on her inhaler.  Was told to recheck TSH in about 2 months after d/c.  Needs RF of synthroid.  Has f/up appts with GI, cardiology and endocrinology within the next month.  She feels she is getting stronger overall.  No Vomiting.  Some intermittent nausea.  No fever.  No CP.    Date of admission: 11/28/2018             Date of discharge:  12/02/2018    Discharge Diagnoses:  Principal diagnosis Epigastric abdominal pain, intractable nausea and vomiting  Principal Problem:   Abdominal pain Active Problems:   Coronary artery disease   DKA, type 1 (HCC)   Intractable nausea and vomiting   Diarrhea  Admitted From: home Disposition:  Home    Recommendations for Outpatient Follow-up:  1. PCP: 1 week. May obtain a GI ref from PCP.  2. Endo: Pt has an endocrinologist; encourage to f/u in 2 week.  History of present illness: on admission:  Jennifer A Lingerfeltis a 48 y.o.femalewith medical history significant  ofupon diabetes with insulin pump, frequent DKA's, possible gastroparesis, coronary artery disease with recent coronary artery bypass grafting on October 10 who is recuperating from that presenting with nausea and episode of vomiting and persistent abdominal pain mainly in the epigastric region. Patient was seen and evaluated in the ER. She was found to be persistently tachycardic. She is weak. Patient is hyperglycemic but not in DKA. She appears dehydrated. She is being admitted for observation and management of the sinus tachycardia which is persistent.  Hospital Course:  Jennifer A Lingerfeltis an 48 y.o.femalewith medical history significant oftype 1 diabetes on insulin pump, possible gastroparesis, CAD status post CABG on 11/04/2018 and post CABG pericarditis, pleuritis, and pleural effusion,recent hospital admission for DKA presenting with complaints of abdominal pain and emesis.Patient states she was doing okay yesterday. Since this morning she is having epigastric abdominal pain/cramps with a burning sensation. She has had multiple episodes of vomiting and has not been able to keep any food down. She also had a few episodes of nonbloody diarrhea. Denies recent antibiotic use. Protonix was prescribed to her to be taken along with NSAIDs during her recent hospitalization.  Epigastric abdominal pain, intractable nausea and vomiting: Resolved    Patient endorses epigastric burning sensation. Suspect GERD/dyspepsia/gstritis and gastroparesis could also be contributing. recently placed on scheduled ibuprofen. ? If she has been taking the PPI.  - D/C ibuprofen on discharge  -CT abdomen pelvis done on  11/1 showing no acute intra-abdominal abnormality. -Patient had a colonoscopy done 2 years ago and was normal as per patient - May need an EGD therefore recommend follow-up with PCP for GI outpatient referral -IV Protonix and IV Pepcid for GERD: PO at home  -IV Reglan; given Rx for  PO -IV Zofran: Rx given for PO + Hx of marijuana- ? If this is contributing - Advised. Pt may f/u with PCP   Elevated plts - Iron panel WNL  - But improved from admission  -could be reactive post surgery?  Type 1 DM continue insulin pump   Elevated TSH -Patient has been on home levothyroxine -Patient has an endocrinologist recommended to follow-up within endocrinologist in 1 to 2 weeks  Diarrhea - Improved  On admission Pt was having several episodes of nonbloody diarrhea today. Denies recent antibiotic use. Afebrile and no leukocytosis.No episodes of diarrhea since she has been in the ED. -GI pathogen panel d/c'd as no further issues  CAD status post CABG on 11/04/2018 and post CABG pericarditis, pleuritis, and pleural effusion Chest x-ray showing small left pleural effusionEKG showing STabnormality in inferior and lateral leads. ST abnormality in lateral leads seen on prior tracing from 11/24/2018 as well.Patient is not endorsing chest pain. -Cardiac monitoring -Continue aspirin, Plavix, metoprolol, Isordil, Lipitor  Abnormal CXR Chest x-ray showingworsening left lower lobe atelectasis or infiltrate. Pneumonia less likely given no fever or leukocytosis.No signs of respiratory distress. procalcitonin level negative -incentive spirometry -no hypoxia  Depression -Continue Prozac Body mass index is 24.25 kg/m.  Nutrition Interventions:     Pain control  - [place] Controlled Substance Reporting System database was reviewed. - 0 day supply was provided. - Patient was instructed, not to drive, operate heavy machinery, perform activities at heights, swimming or participation in water activities or provide baby sitting services while on Pain, Sleep and Anxiety Medications; until her outpatient Physician has advised to do so again.  - Also recommended to not to take more than prescribed Pain, Sleep and Anxiety Medications.      Observations/Objective:  NAD.  A&Ox3   Assessment and Plan: 1. Diabetes mellitus without complication ([place]) Uncontrolled-see endocrine as scheduled 12/17 - insulin aspart (NOVOLOG) 100 UNIT/ML injection; 35 units per insulin pump daily  Dispense: 10 mL; Refill: 1  2. Gastroparesis See GI as scheduled 12/3 - metoCLOPramide (REGLAN) 5 MG tablet; Take 1 tablet (5 mg total) by mouth every 12 (twelve) hours as needed for nausea or vomiting.  Dispense: 90 tablet; Refill: 1 - ondansetron (ZOFRAN ODT) 4 MG disintegrating tablet; Take 1 tablet (4 mg total) by mouth every 8 (eight) hours as needed for nausea or vomiting.  Dispense: 20 tablet; Refill: 1  3. Hypothyroidism, unspecified type Will recheck in ~6-8 weeks - levothyroxine (EUTHYROX) 100 MCG tablet; Take 1 tablet (100 mcg total) by mouth daily before breakfast. Must have office visit for refills  Dispense: 30 tablet; Refill: 3  4. Bronchospasm - albuterol (VENTOLIN HFA) 108 (90 Base) MCG/ACT inhaler; Inhale 2 puffs into the lungs every 6 (six) hours as needed for wheezing or shortness of breath.  Dispense: 18 g; Refill: 0 - Ambulatory referral to Pulmonology  5. Hospital discharge follow-up Labs deferred to specialist services    Follow Up Instructions: See PCP in ~2 months;  Sooner if needed   I discussed the assessment and treatment plan with the patient. The patient was provided an opportunity to ask questions and all were answered. The patient agreed with the plan and demonstrated  an understanding of the instructions.   The patient was advised to call back or seek an in-person evaluation if the symptoms worsen or if the condition fails to improve as anticipated.  I provided 16 minutes of non-face-to-face time during this encounter.   Freeman Caldron, PA-C

## 2018-12-17 ENCOUNTER — Other Ambulatory Visit: Payer: Self-pay

## 2018-12-17 ENCOUNTER — Ambulatory Visit: Payer: Self-pay | Attending: Nurse Practitioner | Admitting: Physician Assistant

## 2018-12-17 DIAGNOSIS — J9801 Acute bronchospasm: Secondary | ICD-10-CM

## 2018-12-17 DIAGNOSIS — E039 Hypothyroidism, unspecified: Secondary | ICD-10-CM

## 2018-12-17 DIAGNOSIS — K3184 Gastroparesis: Secondary | ICD-10-CM

## 2018-12-17 DIAGNOSIS — Z09 Encounter for follow-up examination after completed treatment for conditions other than malignant neoplasm: Secondary | ICD-10-CM

## 2018-12-17 DIAGNOSIS — E119 Type 2 diabetes mellitus without complications: Secondary | ICD-10-CM

## 2018-12-17 DIAGNOSIS — E1165 Type 2 diabetes mellitus with hyperglycemia: Secondary | ICD-10-CM

## 2018-12-17 MED ORDER — LEVOTHYROXINE SODIUM 100 MCG PO TABS: 100.0000 ug | ORAL_TABLET | Freq: Every day | ORAL | 3 refills | Status: DC

## 2018-12-17 MED ORDER — ONDANSETRON 4 MG PO TBDP: 4.0000 mg | ORAL_TABLET | Freq: Three times a day (TID) | ORAL | 1 refills | Status: DC | PRN

## 2018-12-17 MED ORDER — INSULIN ASPART 100 UNIT/ML ~~LOC~~ SOLN: SUBCUTANEOUS | 1 refills | Status: DC

## 2018-12-17 MED ORDER — ALBUTEROL SULFATE HFA 108 (90 BASE) MCG/ACT IN AERS: 2.0000 | INHALATION_SPRAY | Freq: Four times a day (QID) | RESPIRATORY_TRACT | 0 refills | Status: DC | PRN

## 2018-12-17 MED ORDER — METOCLOPRAMIDE HCL 5 MG PO TABS: 5.0000 mg | ORAL_TABLET | Freq: Two times a day (BID) | ORAL | 1 refills | Status: DC | PRN

## 2018-12-17 MED FILL — ONDANSETRON ODT 4 MG TABLET: 4 | 6 days supply | Qty: 20 | Fill #0

## 2018-12-17 MED FILL — LEVOTHYROXINE 100 MCG TAB: 100 | 30 days supply | Qty: 30 | Fill #0

## 2018-12-17 MED FILL — METOCLOPRAMIDE 5 MG TABLET: 5 | 30 days supply | Qty: 60 | Fill #0

## 2018-12-17 NOTE — Progress Notes (Signed)
Patient verified DOB Patient has eaten today. Patient has not taken medication. Patient complains of discomfort in the surgical area. Patient needs levo, albuterol medications.

## 2018-12-23 ENCOUNTER — Encounter: Payer: Self-pay | Admitting: Cardiothoracic Surgery

## 2018-12-26 ENCOUNTER — Ambulatory Visit: Payer: Self-pay | Admitting: Physician Assistant

## 2018-12-27 ENCOUNTER — Encounter: Payer: Self-pay | Admitting: Cardiothoracic Surgery

## 2018-12-27 ENCOUNTER — Inpatient Hospital Stay: Payer: Self-pay | Admitting: Nurse Practitioner

## 2018-12-30 ENCOUNTER — Encounter: Payer: Self-pay | Admitting: Cardiothoracic Surgery

## 2019-01-06 ENCOUNTER — Encounter: Payer: Self-pay | Admitting: Cardiothoracic Surgery

## 2019-01-08 ENCOUNTER — Ambulatory Visit (INDEPENDENT_AMBULATORY_CARE_PROVIDER_SITE_OTHER): Payer: Self-pay | Admitting: Cardiology

## 2019-01-08 ENCOUNTER — Encounter: Payer: Self-pay | Admitting: Cardiology

## 2019-01-08 ENCOUNTER — Other Ambulatory Visit: Payer: Self-pay

## 2019-01-08 VITALS — BP 102/72 | HR 87 | Ht 65.0 in | Wt 154.0 lb

## 2019-01-08 DIAGNOSIS — I255 Ischemic cardiomyopathy: Secondary | ICD-10-CM

## 2019-01-08 DIAGNOSIS — E785 Hyperlipidemia, unspecified: Secondary | ICD-10-CM

## 2019-01-08 DIAGNOSIS — I2510 Atherosclerotic heart disease of native coronary artery without angina pectoris: Secondary | ICD-10-CM

## 2019-01-08 MED ORDER — NITROGLYCERIN 0.4 MG SL SUBL: 0.4000 mg | SUBLINGUAL_TABLET | SUBLINGUAL | 0 refills | Status: DC | PRN

## 2019-01-08 MED ORDER — ISOSORBIDE MONONITRATE ER 30 MG PO TB24: 15.0000 mg | ORAL_TABLET | Freq: Every day | ORAL | 3 refills | Status: DC

## 2019-01-08 MED FILL — ?HUMALOG 100 UNITS/ML VIAL: 100 | 28 days supply | Qty: 10 | Fill #0

## 2019-01-08 NOTE — Patient Instructions (Signed)
Medication Instructions:  Your physician has recommended you make the following change in your medication:   1) DISCONTINUE taking Lopressor (metoprolol tartrate) 2) DISCONTINUE taking Isordil (isosorbide dinitrate) 3) START taking Imdur (isosorbide mononitrate) 15 mg (half a tablet) daily   *If you need a refill on your cardiac medications before your next appointment, please call your pharmacy*  Lab Work: TODAY: fasting lipid panel and CMET  If you have labs (blood work) drawn today and your tests are completely normal, you will receive your results only by: Marland Kitchen MyChart Message (if you have MyChart) OR . A paper copy in the mail If you have any lab test that is abnormal or we need to change your treatment, we will call you to review the results.  Follow-Up: At [facility], you and your health needs are our priority.  As part of our continuing mission to provide you with exceptional heart care, we have created designated Provider Care Teams.  These Care Teams include your primary Cardiologist (physician) and Advanced Practice Providers (APPs -  Physician Assistants and Nurse Practitioners) who all work together to provide you with the care you need, when you need it.  Your next appointment:   6 month(s)  The format for your next appointment:   Either In Person or Virtual  Provider:   Fransico Him, MD

## 2019-01-08 NOTE — Progress Notes (Signed)
Date:  01/08/2019   ID:  Jennifer Hogan, DOB 08/31/1970, MRN [id number]  PCP:  [name], NP  Cardiologist:  Fransico Him, MD  Electrophysiologist:  None   Chief Complaint:  CAD  History of Present Illness:    Jennifer Hogan is a 48 y.o. female who presents via audio/video conferencing for a telehealth visit today.    Jennifer Hogan is a 48 y.o. female with a hx of type 1 diabetes mellitus on chronic insulin pump, hypothyroidismandischemic dilated cardiomyopathy EF 45 to 50% with apical hypokinesis. She has severe 3 vessel ASCAD s/p NSTEMIwith cath showing severe three-vessel CAD status post PTCA DES x3 in the LAD(ostial DES placed, mid DES placed, distal DES placed)andPTCA/DES x 1 intermediate branch.She was placed on DAPT w/ ASA and Brilinta, along with high dose statin therapy, Lipitor 80 mg, (LDLelevated at 126 mg/dL). She had issues with orthostatic hypotension, thus no ACE/ARB was added.   In January 2020 presented with STEMI and underwent stenting of ostial to proximal RCA. She had residual moderate disease in the mid LAD between stents and a ostial ramus branch and was treated medically.  She was readmitted 10/29/2018 with N/V and abdominal pain and then developed CP, weakness and lightheadedness.  She presented to ER hypotensive with SBP 75mmHg and responded to IVF.  She ruled in for MI with Trop of 1138 and underwent cath showing significant stenosis in RI and 60% mLAD.  She ultimately underwent CABG with LIMA>LAD and RI, RIMA>OM.    She is here today for followup and is doing well.  She denies any chest pain or pressure, SOB, DOE, PND, orthopnea, LE edema, dizziness, palpitations or syncope. She does still have some chest wall skin discomfort from her surgery. She is compliant with her meds and is tolerating meds with no SE.    The patient does not have symptoms concerning for COVID-19 infection (fever, chills, cough, or new shortness of breath).    Prior CV  studies:   The following studies were reviewed today:  none  Past Medical History:  Diagnosis Date  . Anemia   . Anxiety   . Coronary artery disease    a. s/pPTCA DES x3 in the LAD (ostial DES placed, mid DES placed, distal DES placed)and PTCA/DES x1to intermediate branch 02/2017. b. ACS 02/08/18 with LCx stent placement.  . Depression   . Diabetes mellitus    type 1, on insulin pump  . DKA (diabetic ketoacidoses) ([place]) 12/31/2017  . Elevated platelet count   . High cholesterol   . Hypothyroidism   . Ischemic cardiomyopathy    a. EF low-normal with basal inferior akinesis, basal septal hypokinesis by echo 01/2018.   Past Surgical History:  Procedure Laterality Date  . APPENDECTOMY    . CORONARY ARTERY BYPASS GRAFT N/A 11/04/2018   Procedure: CORONARY ARTERY BYPASS GRAFTING (CABG), ON PUMP, TIMES THREE, USING LEFT AND RIGHT INTERNAL MAMMARY ARTERIES;  Surgeon: Wonda Olds, MD;  Location: [facility];  Service: Open Heart Surgery;  Laterality: N/A;  . CORONARY STENT INTERVENTION N/A 03/20/2017   Procedure: DES x 3 LAD, DES OM; Surgeon: Burnell Blanks, MD;  Location: [facility];  Service: Cardiovascular;  Laterality: N/A;  . CORONARY/GRAFT ACUTE MI REVASCULARIZATION N/A 02/08/2018   Procedure: Coronary/Graft Acute MI Revascularization;  Surgeon: Belva Crome, MD;  Location: [facility];  Service: Cardiovascular;  Laterality: N/A;  . LEFT HEART CATH AND CORONARY ANGIOGRAPHY N/A 03/20/2017   Procedure: LEFT  HEART CATH AND CORONARY ANGIOGRAPHY;  Surgeon: Burnell Blanks, MD;  Location: [facility];  Service: Cardiovascular;  Laterality: N/A;  . LEFT HEART CATH AND CORONARY ANGIOGRAPHY N/A 02/08/2018   Procedure: LEFT HEART CATH AND CORONARY ANGIOGRAPHY;  Surgeon: Belva Crome, MD;  Location: [facility];  Service: Cardiovascular;  Laterality: N/A;  . LEFT HEART CATH AND CORONARY ANGIOGRAPHY N/A 10/29/2018   Procedure: LEFT HEART CATH AND  CORONARY ANGIOGRAPHY;  Surgeon: Burnell Blanks, MD;  Location: [facility];  Service: Cardiovascular;  Laterality: N/A;  . TEE WITHOUT CARDIOVERSION N/A 11/04/2018   Procedure: TRANSESOPHAGEAL ECHOCARDIOGRAM (TEE);  Surgeon: Wonda Olds, MD;  Location: [facility];  Service: Open Heart Surgery;  Laterality: N/A;     Current Meds  Medication Sig  . acetaminophen (TYLENOL) 500 MG tablet Take 500 mg by mouth as needed.  Marland Kitchen albuterol (VENTOLIN HFA) 108 (90 Base) MCG/ACT inhaler Inhale 2 puffs into the lungs every 6 (six) hours as needed for wheezing or shortness of breath.  Marland Kitchen aspirin 81 MG EC tablet Take 1 tablet (81 mg total) by mouth daily.  Marland Kitchen atorvastatin (LIPITOR) 80 MG tablet Take 1 tablet (80 mg total) by mouth daily at 6 PM.  . clopidogrel (PLAVIX) 75 MG tablet Take 1 tablet (75 mg total) by mouth daily with breakfast. Please keep upcoming appt in November for future refills. Thank you  . FLUoxetine (PROZAC) 40 MG capsule Take 1 capsule (40 mg total) by mouth daily.  Marland Kitchen ibuprofen (ADVIL) 800 MG tablet Take 800 mg by mouth as needed.  . insulin aspart (NOVOLOG) 100 UNIT/ML injection 35 units per insulin pump daily  . ipratropium (ATROVENT) 0.06 % nasal spray Place 2 sprays into both nostrils 2 (two) times daily as needed for rhinitis.  Marland Kitchen isosorbide dinitrate (ISORDIL) 5 MG tablet Take 1 tablet (5 mg total) by mouth 3 (three) times daily.  Marland Kitchen levothyroxine (EUTHYROX) 100 MCG tablet Take 1 tablet (100 mcg total) by mouth daily before breakfast. Must have office visit for refills  . metoCLOPramide (REGLAN) 5 MG tablet Take 1 tablet (5 mg total) by mouth every 12 (twelve) hours as needed for nausea or vomiting.  . metoprolol tartrate (LOPRESSOR) 25 MG tablet Take 0.5 tablets (12.5 mg total) by mouth 2 (two) times daily. Hold for SBP<90 and HR<50  . montelukast (SINGULAIR) 10 MG tablet Take 10 mg by mouth at bedtime.  . nitroGLYCERIN (NITROSTAT) 0.4 MG SL tablet Place 1 tablet (0.4 mg  total) under the tongue every 5 (five) minutes as needed for chest pain.  Marland Kitchen ondansetron (ZOFRAN ODT) 4 MG disintegrating tablet Take 1 tablet (4 mg total) by mouth every 8 (eight) hours as needed for nausea or vomiting.  . pantoprazole (PROTONIX) 40 MG tablet Take 40 mg by mouth 2 (two) times daily as needed.  . Simethicone (PHAZYME PO) Take 1 tablet by mouth as needed (upset stomach).  . sucralfate (CARAFATE) 1 GM/10ML suspension Take 1 g by mouth as needed.  . [DISCONTINUED] nitroGLYCERIN (NITROSTAT) 0.4 MG SL tablet Place 1 tablet (0.4 mg total) under the tongue every 5 (five) minutes as needed for chest pain.     Allergies:   Crestor [rosuvastatin calcium], Peanut-containing drug products, Ciprofloxin hcl [ciprofloxacin], and Food color red [red dye]   Social History   Tobacco Use  . Smoking status: Never Smoker  . Smokeless tobacco: Never Used  . Tobacco comment: Use marijuana  Substance Use Topics  . Alcohol  use: No    Alcohol/week: 0.0 standard drinks  . Drug use: Yes    Types: Marijuana    Comment: last use last week, occaisonal marijuana use     Family Hx: The patient's family history includes Asthma in her father; Cancer in her mother; Congestive Heart Failure in her father; Coronary artery disease in her father.  ROS:   Please see the history of present illness.     All other systems reviewed and are negative.   Labs/Other Tests and Data Reviewed:    Recent Labs: 11/05/2018: Magnesium 2.7 11/28/2018: ALT 14 12/01/2018: TSH 17.338 12/02/2018: BUN 5; Creatinine, Ser 0.81; Hemoglobin 10.3; Platelets 611; Potassium 3.4; Sodium 137   Recent Lipid Panel Lab Results  Component Value Date/Time   CHOL 215 (H) 07/23/2018 10:08 AM   TRIG 157 (H) 07/23/2018 10:08 AM   HDL 63 07/23/2018 10:08 AM   CHOLHDL 3.4 07/23/2018 10:08 AM   CHOLHDL 3.2 02/08/2018 05:17 AM   LDLCALC 121 (H) 07/23/2018 10:08 AM    Wt Readings from Last 3 Encounters:  01/08/19 154 lb (69.9 kg)   12/09/18 150 lb (68 kg)  12/02/18 145 lb 11.2 oz (66.1 kg)     Objective:    Vital Signs:  BP 102/72   Pulse 87   Ht 5\' 5"  (1.651 m)   Wt 154 lb (69.9 kg)   SpO2 98%   BMI 25.63 kg/m    CONSTITUTIONAL:  Well nourished, well developed female in no acute distress.  EYES: anicteric MOUTH: oral mucosa is pink RESPIRATORY: Normal respiratory effort, symmetric expansion CARDIOVASCULAR: No peripheral edema SKIN: No rash, lesions or ulcers MUSCULOSKELETAL: no digital cyanosis NEURO: Cranial Nerves II-XII grossly intact, moves all extremities PSYCH: Intact judgement and insight.  A&O x 3, Mood/affect appropriate   ASSESSMENT & PLAN:    1.  ASCAD -s/p NSTEMI with cath showingsevere three-vessel CAD status post PTCA DES x3 in the LAD(ostial DES placed, mid DES placed, distal DES placed)andPTCA/DES x 1 intermediate branch. -s/p CABG 11/2018 -she denies any anginal sx -continue ASA 81mg  daily, Plavix 75mg  daily, high dose statin -she has been having problems with feeling dizzy and fatigued after her BB and her BP is soft. -stop Lopressor -change Isordil to Imdur 15mg  daily as she forgets to take her doses other than the am of Isordil  2.  Ischemic DCM -EF normalized at 55-60% by echo 11/2018  3.  Hyperlipidemia -LDL goal < 70 -continue Atorvastatin 80mg  daily -check CMET and FLP  COVID-19 Education: The signs and symptoms of COVID-19 were discussed with the patient and how to seek care for testing (follow up with PCP or arrange E-visit).  The importance of social distancing was discussed today.  Patient Risk:   After full review of this patient's clinical status, I feel that they are at least moderate risk at this time.  Time:   Today, I have spent 20 minutes on  telemedicine discussing medical problems including CAD, HLD.  We also reviewed the symptoms of COVID 19 and the ways to protect against contracting the virus with telehealth technology.  I spent an additional 5  minutes reviewing patient's chart including labs, 2D echo.  Medication Adjustments/Labs and Tests Ordered: Current medicines are reviewed at length with the patient today.  Concerns regarding medicines are outlined above.  Tests Ordered: No orders of the defined types were placed in this encounter.  Medication Changes: Meds ordered this encounter  Medications  . nitroGLYCERIN (NITROSTAT) 0.4 MG SL tablet  Sig: Place 1 tablet (0.4 mg total) under the tongue every 5 (five) minutes as needed for chest pain.    Dispense:  30 tablet    Refill:  0    Disposition:  Follow up in 6 month(s)  Signed, Fransico Him, MD  01/08/2019 1:07 PM    [place]

## 2019-01-09 ENCOUNTER — Encounter: Payer: Self-pay | Admitting: Endocrinology

## 2019-01-09 ENCOUNTER — Other Ambulatory Visit: Payer: Self-pay

## 2019-01-09 ENCOUNTER — Telehealth: Payer: Self-pay

## 2019-01-09 ENCOUNTER — Ambulatory Visit (INDEPENDENT_AMBULATORY_CARE_PROVIDER_SITE_OTHER): Payer: Self-pay | Admitting: Endocrinology

## 2019-01-09 VITALS — BP 90/50 | HR 109 | Ht 65.0 in | Wt 154.0 lb

## 2019-01-09 DIAGNOSIS — E119 Type 2 diabetes mellitus without complications: Secondary | ICD-10-CM

## 2019-01-09 DIAGNOSIS — E1059 Type 1 diabetes mellitus with other circulatory complications: Secondary | ICD-10-CM

## 2019-01-09 LAB — COMPREHENSIVE METABOLIC PANEL
ALT: 16 IU/L (ref 0–32)
AST: 26 IU/L (ref 0–40)
Albumin/Globulin Ratio: 1.4 (ref 1.2–2.2)
Albumin: 4.2 g/dL (ref 3.8–4.8)
Alkaline Phosphatase: 117 IU/L (ref 39–117)
BUN/Creatinine Ratio: 9 (ref 9–23)
BUN: 8 mg/dL (ref 6–24)
Bilirubin Total: 0.3 mg/dL (ref 0.0–1.2)
CO2: 20 mmol/L (ref 20–29)
Calcium: 9 mg/dL (ref 8.7–10.2)
Chloride: 102 mmol/L (ref 96–106)
Creatinine, Ser: 0.85 mg/dL (ref 0.57–1.00)
GFR calc Af Amer: 92 mL/min/{1.73_m2} (ref >59)
GFR calc non Af Amer: 80 mL/min/{1.73_m2} (ref >59)
Globulin, Total: 3.1 g/dL (ref 1.5–4.5)
Glucose: 107 mg/dL — ABNORMAL HIGH (ref 65–99)
Potassium: 4.6 mmol/L (ref 3.5–5.2)
Sodium: 138 mmol/L (ref 134–144)
Total Protein: 7.3 g/dL (ref 6.0–8.5)

## 2019-01-09 LAB — TSH: TSH: 1.06 u[IU]/mL (ref 0.35–4.50)

## 2019-01-09 LAB — LIPID PANEL
Chol/HDL Ratio: 3.3 ratio (ref 0.0–4.4)
Cholesterol, Total: 173 mg/dL (ref 100–199)
HDL: 53 mg/dL (ref >39)
LDL Chol Calc (NIH): 103 mg/dL — ABNORMAL HIGH (ref 0–99)
Triglycerides: 91 mg/dL (ref 0–149)
VLDL Cholesterol Cal: 17 mg/dL (ref 5–40)

## 2019-01-09 LAB — POCT GLYCOSYLATED HEMOGLOBIN (HGB A1C): Hemoglobin A1C: 8.2 % — AB (ref 4.0–5.6)

## 2019-01-09 NOTE — Patient Instructions (Addendum)
check your blood sugar 8 times a day.  vary the time of day when you check, between before the 3 meals, and at bedtime.  also check if you have symptoms of your blood sugar being too high or too low.  please keep a record of the readings and bring it to your next appointment here.  please call us sooner if your blood sugar goes below 70, or if you have a lot of readings over 200.   Please continue these pump settings:  basal rate of 1.5 units/hr, 6 AM-10 PM, and 0.5 units/hr overnight.  Suspend for activity for 1-2 hrs No mealtime bolus.  correction bolus (which some people call "sensitivity," or "insulin sensitivity ratio," or just "isr") of 1 unit for each 60 by which your glucose exceeds 100.    On this type of insulin schedule, you should eat meals on a regular schedule.  If a meal is missed or significantly delayed, your blood sugar could go low.   Blood tests are requested for you today.  We'll let you know about the results.    Please come back for a follow-up appointment in 1 month.

## 2019-01-09 NOTE — Telephone Encounter (Signed)
lpmtcb 12/17 labs

## 2019-01-09 NOTE — Progress Notes (Signed)
Subjective:    [name], female    DOB: [birth year], 48 y.o.   MRN: [id number]  HPI Pt returns for f/u of diabetes mellitus:  DM type: 1  Dx'ed: [id number] Complications: CAD.   Therapy: insulin since dx.  DKA: never Severe hypoglycemia: never.  Pancreatitis: never Other: she has been on pump (medtronic paradigm) rx since 2014; she stopped continuous glucose monitor, due to cost; rx is limited by lack of health insurance; she is educator, and also caregiver for her parents.   Interval history: She makes frequent adjustments in her pump settings.  She now takes:   basal rate of 0.7 units/hr. mealtime bolus of 1 unit/17 grams CHO correction bolus (which some people call "sensitivity," or "insulin sensitivity ratio," or just "isr") of 1 unit for each 60 by which your glucose exceeds 100.  She was in the hospital for DKA 1 month ago.  She is unable to cite precip cause.   Meter is downloaded today, and the printout is scanned into the record.  She checks 0-2 times per day.  cbg varies from 200-300.     TDD is 20-30 units per day.   She has mild hypoglycemia approx QOD.  Past Medical History:  Diagnosis Date  . Anemia   . Anxiety   . Coronary artery disease    a. s/pPTCA DES x3 in the LAD (ostial DES placed, mid DES placed, distal DES placed)and PTCA/DES x1to intermediate branch 02/2017. b. ACS 02/08/18 with LCx stent placement.  . Depression   . Diabetes mellitus    type 1, on insulin pump  . DKA (diabetic ketoacidoses) ([place]) 12/31/2017  . Elevated platelet count   . High cholesterol   . Hypothyroidism   . Ischemic cardiomyopathy    a. EF low-normal with basal inferior akinesis, basal septal hypokinesis by echo 01/2018.    Past Surgical History:  Procedure Laterality Date  . APPENDECTOMY    . CORONARY ARTERY BYPASS GRAFT N/A 11/04/2018   Procedure: CORONARY ARTERY BYPASS GRAFTING (CABG), ON PUMP, TIMES THREE, USING LEFT AND RIGHT INTERNAL MAMMARY ARTERIES;   Surgeon: Wonda Olds, MD;  Location: [facility];  Service: Open Heart Surgery;  Laterality: N/A;  . CORONARY STENT INTERVENTION N/A 03/20/2017   Procedure: DES x 3 LAD, DES OM; Surgeon: Burnell Blanks, MD;  Location: [facility];  Service: Cardiovascular;  Laterality: N/A;  . CORONARY/GRAFT ACUTE MI REVASCULARIZATION N/A 02/08/2018   Procedure: Coronary/Graft Acute MI Revascularization;  Surgeon: Belva Crome, MD;  Location: [facility];  Service: Cardiovascular;  Laterality: N/A;  . LEFT HEART CATH AND CORONARY ANGIOGRAPHY N/A 03/20/2017   Procedure: LEFT HEART CATH AND CORONARY ANGIOGRAPHY;  Surgeon: Burnell Blanks, MD;  Location: [facility];  Service: Cardiovascular;  Laterality: N/A;  . LEFT HEART CATH AND CORONARY ANGIOGRAPHY N/A 02/08/2018   Procedure: LEFT HEART CATH AND CORONARY ANGIOGRAPHY;  Surgeon: Belva Crome, MD;  Location: [facility];  Service: Cardiovascular;  Laterality: N/A;  . LEFT HEART CATH AND CORONARY ANGIOGRAPHY N/A 10/29/2018   Procedure: LEFT HEART CATH AND CORONARY ANGIOGRAPHY;  Surgeon: Burnell Blanks, MD;  Location: [facility];  Service: Cardiovascular;  Laterality: N/A;  . TEE WITHOUT CARDIOVERSION N/A 11/04/2018   Procedure: TRANSESOPHAGEAL ECHOCARDIOGRAM (TEE);  Surgeon: Wonda Olds, MD;  Location: [facility];  Service: Open Heart Surgery;  Laterality: N/A;    Social History   Socioeconomic History  . Marital  status: Divorced    Spouse name: Not on file  . Number of children: Not on file  . Years of education: Not on file  . Highest education level: Not on file  Occupational History  . 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 Smoking status: Never Smoker  . Smokeless tobacco: Never Used  . Tobacco comment: Use marijuana  Substance and Sexual Activity  . Alcohol use: No    Alcohol/week: 0.0 standard drinks  . Drug use: Yes    Types: Marijuana    Comment: last use last  week, occaisonal marijuana use  . Sexual activity: Not Currently    Birth control/protection: None  Other Topics Concern  . Not on file  Social History Narrative  . Not on file   Social Determinants of Health   Financial Resource Strain:   . Difficulty of Paying Living Expenses: Not on file  Food Insecurity:   . Worried About Charity fundraiser in the Last Year: Not on file  . Ran Out of Food in the Last Year: Not on file  Transportation Needs:   . Lack of Transportation (Medical): Not on file  . Lack of Transportation (Non-Medical): Not on file  Physical Activity:   . Days of Exercise per Week: Not on file  . Minutes of Exercise per Session: Not on file  Stress:   . Feeling of Stress : Not on file  Social Connections:   . Frequency of Communication with Friends and Family: Not on file  . Frequency of Social Gatherings with Friends and Family: Not on file  . Attends Religious Services: Not on file  . Active Member of Clubs or Organizations: Not on file  . Attends Archivist Meetings: Not on file  . Marital Status: Not on file  Intimate Partner Violence:   . Fear of Current or Ex-Partner: Not on file  . Emotionally Abused: Not on file  . Physically Abused: Not on file  . Sexually Abused: Not on file    Current Outpatient Medications on File Prior to Visit  Medication Sig Dispense Refill  . acetaminophen (TYLENOL) 500 MG tablet Take 500 mg by mouth as needed.    Marland Kitchen albuterol (VENTOLIN HFA) 108 (90 Base) MCG/ACT inhaler Inhale 2 puffs into the lungs every 6 (six) hours as needed for wheezing or shortness of breath. 18 g 0  . aspirin 81 MG EC tablet Take 1 tablet (81 mg total) by mouth daily. 90 tablet 0  . atorvastatin (LIPITOR) 80 MG tablet Take 1 tablet (80 mg total) by mouth daily at 6 PM. 30 tablet 3  . clopidogrel (PLAVIX) 75 MG tablet Take 1 tablet (75 mg total) by mouth daily with breakfast. Please keep upcoming appt in November for future refills. Thank you  90 tablet 0  . FLUoxetine (PROZAC) 40 MG capsule Take 1 capsule (40 mg total) by mouth daily. 30 capsule 2  . ibuprofen (ADVIL) 800 MG tablet Take 800 mg by mouth as needed.    . insulin aspart (NOVOLOG) 100 UNIT/ML injection 35 units per insulin pump daily 10 mL 1  . ipratropium (ATROVENT) 0.06 % nasal spray Place 2 sprays into both nostrils 2 (two) times daily as needed for rhinitis.    Marland Kitchen isosorbide mononitrate (IMDUR) 30 MG 24 hr tablet Take 0.5 tablets (15 mg total) by mouth daily. 45 tablet 3  . levothyroxine (EUTHYROX) 100 MCG tablet Take 1 tablet (100 mcg total) by mouth daily  before breakfast. Must have office visit for refills 30 tablet 3  . metoCLOPramide (REGLAN) 5 MG tablet Take 1 tablet (5 mg total) by mouth every 12 (twelve) hours as needed for nausea or vomiting. 90 tablet 1  . montelukast (SINGULAIR) 10 MG tablet Take 10 mg by mouth at bedtime.    . nitroGLYCERIN (NITROSTAT) 0.4 MG SL tablet Place 1 tablet (0.4 mg total) under the tongue every 5 (five) minutes as needed for chest pain. 30 tablet 0  . ondansetron (ZOFRAN ODT) 4 MG disintegrating tablet Take 1 tablet (4 mg total) by mouth every 8 (eight) hours as needed for nausea or vomiting. 20 tablet 1  . pantoprazole (PROTONIX) 40 MG tablet Take 40 mg by mouth 2 (two) times daily as needed.    . Simethicone (PHAZYME PO) Take 1 tablet by mouth as needed (upset stomach).    . sucralfate (CARAFATE) 1 GM/10ML suspension Take 1 g by mouth as needed.     No current facility-administered medications on file prior to visit.    Allergies  Allergen Reactions  . Crestor [Rosuvastatin Calcium] Other (See Comments)    Severe myalgias and joint pain. Has tolerated atorvastatin though  . Peanut-Containing Drug Products Other (See Comments)    Vomiting, upset stomach and some wheezing  . Ciprofloxin Hcl [Ciprofloxacin] Nausea And Vomiting and Other (See Comments)    Severe migraine  . Food Color Red [Red Dye] Diarrhea    Family History   Problem Relation Age of Onset  . Coronary artery disease Father   . Congestive Heart Failure Father   . Asthma Father   . Cancer Mother        T cell lymphoma    BP (!) 90/50 (BP Location: Right Arm, Patient Position: Sitting, Cuff Size: Normal)   Pulse (!) 109   Ht 5\' 5"  (1.651 m)   Wt 154 lb (69.9 kg)   SpO2 98%   BMI 25.63 kg/m    Review of Systems She denies LOC    Objective:   Physical Exam VITAL SIGNS:  See vs page GENERAL: no distress Pulses: dorsalis pedis intact bilat.   MSK: no deformity of the feet CV: no leg edema Skin:  no ulcer on the feet.  normal color and temp on the feet.  Neuro: sensation is intact to touch on the feet.  PSYCH: anxious.    Lab Results  Component Value Date   TSH 17.338 (H) 12/01/2018    Lab Results  Component Value Date   HGBA1C 8.2 (A) 01/09/2019       Assessment & Plan:  Type 1 DM, with CAD: she needs increased rx Noncompliance with cbg reable to interpret cbg's, due to frequent adjustments.  Hypoglycemia: this limits aggressiveness of glycemic control  Patient Instructions  check your blood sugar 8 times a day.  vary the time of day when you check, between before the 3 meals, and at bedtime.  also check if you have symptoms of your blood sugar being too high or too low.  please keep a record of the readings and bring it to your next appointment here.  please call us sooner if your blood sugar goes below 70, or if you have a lot of readings over 200.   Please continue these pump settings:  basal rate of 1.5 units/hr, 6 AM-10 PM, and 0.5 units/hr overnight.  Suspend for activity for 1-2 hrs No mealtime bolus.  correction bolus (which some people call "sensitivity," or "insulin sensitivity ratio,"  or just "isr") of 1 unit for each 60 by which your glucose exceeds 100.    On this type of insulin schedule, you should eat meals on a regular schedule.  If a meal is missed or significantly delayed, your blood sugar could go low.     Blood tests are requested for you today.  We'll let you know about the results.    Please come back for a follow-up appointment in 1 month.

## 2019-01-10 ENCOUNTER — Telehealth: Payer: Self-pay

## 2019-01-10 DIAGNOSIS — E785 Hyperlipidemia, unspecified: Secondary | ICD-10-CM

## 2019-01-10 DIAGNOSIS — Z79899 Other long term (current) drug therapy: Secondary | ICD-10-CM

## 2019-01-10 DIAGNOSIS — E7800 Pure hypercholesterolemia, unspecified: Secondary | ICD-10-CM

## 2019-01-10 MED ORDER — EZETIMIBE 10 MG PO TABS: 10.0000 mg | ORAL_TABLET | Freq: Every day | ORAL | 3 refills | Status: DC

## 2019-01-10 MED FILL — ?EZETIMIBE 10 MG TABS: 10 | 30 days supply | Qty: 30 | Fill #0

## 2019-01-10 NOTE — Telephone Encounter (Signed)
-----   Message from Frederik Schmidt, RN sent at 01/09/2019  1:42 PM EST -----  ----- Message ----- From: [name], MD Sent: 01/09/2019   9:25 AM EST To: [name], NP, [facility] Triage  LDL not at goal of < 70.  Please add Zetia 10mg  daily and repeat FLP and ALT in 6 weeks.

## 2019-01-10 NOTE — Telephone Encounter (Signed)
LMTCB

## 2019-01-10 NOTE — Telephone Encounter (Signed)
The patient has been notified of the result and verbalized understanding.  All questions (if any) were answered. Antonieta Iba, RN 01/10/2019 3:27 PM

## 2019-01-13 ENCOUNTER — Encounter: Payer: Self-pay | Admitting: Cardiothoracic Surgery

## 2019-01-22 MED FILL — ?EZETIMIBE 10 MG TABLET: 10 | 30 days supply | Qty: 30 | Fill #0

## 2019-01-27 ENCOUNTER — Encounter: Payer: Self-pay | Admitting: Cardiothoracic Surgery

## 2019-01-28 MED FILL — ?HUMALOG 100 UNITS/ML VIAL: 100 | 28 days supply | Qty: 10 | Fill #1

## 2019-01-28 MED FILL — LEVOTHYROXINE 100 MCG TAB: 100 | 30 days supply | Qty: 30 | Fill #1

## 2019-02-11 ENCOUNTER — Ambulatory Visit: Payer: Self-pay | Admitting: Nurse Practitioner

## 2019-02-11 ENCOUNTER — Other Ambulatory Visit: Payer: Self-pay

## 2019-02-12 ENCOUNTER — Ambulatory Visit: Payer: Self-pay | Admitting: Endocrinology

## 2019-02-17 ENCOUNTER — Telehealth: Payer: Self-pay | Admitting: *Deleted

## 2019-02-18 NOTE — Progress Notes (Signed)
[name]                 DOB: 1970/02/11                    MRN: [id number]     HPI: Jennifer Hogan is a 49 y.o. female patient referred to lipid clinic by Dr. Radford Pax. 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 She has severe 3 vessel ASCAD s/p NSTEMIwith cath showing severe three-vessel CAD status post PTCA DES x3 in the LAD (ostial DES placed, mid DES placed, distal DES placed)andPTCA/DES x 1 intermediate branch.She was placed on DAPT w/ ASA and Brilinta, along with high dose statin therapy, Lipitor 80 mg, (LDLelevated at 126 mg/dL). In January 2020, pt presented with STEMI and underwent stenting of ostial to proximal RCA. She had residual moderate disease in the mid LAD between stents and a ostial ramus branch and was treated medically. She was readmitted 10/29/2018 with MI and underwent cath showing significant stenosis in RI and 60% mLAD. She ultimately underwent CABG with LIMA>LAD and RI, RIMA>OM.    Lipid panel on 01/08/19 showed LDL 103 mg/dL on atorvastatin 80 mg, so Zetia 10 mg daily was added. On 02/13/19, the patient left a MyChart message reporting worsening myalgias after starting Zetia 10 mg. She was referred to lipid clinic by Dr. Radford Pax.   Patient arrives at lipid clinic for medication management. She is a Social worker. Does report some medication noncompliance if she does not see or feel the benefit of the medication. She reports tolerating atorvastatin 20 mg in the past, but once the dose was increased to 80 mg, the myalgias were intolerable and made it difficult for her to complete daily tasks. Reports the myalgias worsened when she started taking Zetia 10 mg daily. The patient reports using CBD/THC topical cream for relief of pain in her legs. The patient also mentions she is taking EPA supplements.   The patient adheres to her type 1  diabetes diet. She limits sodium and carbs but cooks and eats butter. She stays active by walking a lot on her 2 acre land. She has set goals to run a mile and add strength training to exercise, but she experiences shortness of breath some days. The patient states that she is unemployed and is still trying to get Medicaid and disability insurance. However, she confirms that the price she pays for atorvastatin is currently affordable and she receives her insulin from [facility].  Current Medications: atorvastatin 80 mg once daily, Zetia 10 mg once daily - myalgias on both Intolerances: rosuvastatin 40 mg (myalgias and joint pain) Risk Factors: CAD s/p multiple MIs, PCIs, and CABG, DM, family history LDL goal: < 55 mg/dL  Diet: Cooks with and eats a lot of butter. Eats mainly fish, but has steak occasionally. Fruits, frozen vegetables, riced brocilli or cauiliflower. Limited foods due to stomach sensitivity.    Exercise: Walks around 2 acres of land most days of the week.  Family History: The patient's family history includes Asthma in her father; Cancer and high cholesterol in her mother; Congestive Heart Failure and heart disease on pacemaker in her father; Coronary artery disease in her father. Grandmother had congestive heart disease  Social History: marijuana use   Lipid panel:  01/08/19- TC 173, TG 91, HDL 93, LDL 103 (atorvastatin 80 mg - adherence issues) 07/23/18: TC 215,  TG 157, HDL 63, LDL 121 (no medications)  Past Medical History:  Diagnosis Date  . Anemia   . Anxiety   . Coronary artery disease    a. s/pPTCA DES x3 in the LAD (ostial DES placed, mid DES placed, distal DES placed)and PTCA/DES x1to intermediate branch 02/2017. b. ACS 02/08/18 with LCx stent placement.  . Depression   . Diabetes mellitus    type 1, on insulin pump  . DKA (diabetic ketoacidoses) ([facility]) 12/31/2017  . Elevated platelet count   . High cholesterol   . Hypothyroidism   . Ischemic  cardiomyopathy    a. EF low-normal with basal inferior akinesis, basal septal hypokinesis by echo 01/2018.    Current Outpatient Medications on File Prior to Visit  Medication Sig Dispense Refill  . acetaminophen (TYLENOL) 500 MG tablet Take 500 mg by mouth as needed.    Marland Kitchen albuterol (VENTOLIN HFA) 108 (90 Base) MCG/ACT inhaler Inhale 2 puffs into the lungs every 6 (six) hours as needed for wheezing or shortness of breath. 18 g 0  . aspirin 81 MG EC tablet Take 1 tablet (81 mg total) by mouth daily. 90 tablet 0  . atorvastatin (LIPITOR) 80 MG tablet Take 1 tablet (80 mg total) by mouth daily at 6 PM. 30 tablet 3  . clopidogrel (PLAVIX) 75 MG tablet Take 1 tablet (75 mg total) by mouth daily with breakfast. Please keep upcoming appt in November for future refills. Thank you 90 tablet 0  . ezetimibe (ZETIA) 10 MG tablet Take 1 tablet (10 mg total) by mouth daily. 90 tablet 3  . FLUoxetine (PROZAC) 40 MG capsule Take 1 capsule (40 mg total) by mouth daily. 30 capsule 2  . ibuprofen (ADVIL) 800 MG tablet Take 800 mg by mouth as needed.    . insulin aspart (NOVOLOG) 100 UNIT/ML injection 35 units per insulin pump daily 10 mL 1  . ipratropium (ATROVENT) 0.06 % nasal spray Place 2 sprays into both nostrils 2 (two) times daily as needed for rhinitis.    Marland Kitchen isosorbide mononitrate (IMDUR) 30 MG 24 hr tablet Take 0.5 tablets (15 mg total) by mouth daily. 45 tablet 3  . levothyroxine (EUTHYROX) 100 MCG tablet Take 1 tablet (100 mcg total) by mouth daily before breakfast. Must have office visit for refills 30 tablet 3  . metoCLOPramide (REGLAN) 5 MG tablet Take 1 tablet (5 mg total) by mouth every 12 (twelve) hours as needed for nausea or vomiting. 90 tablet 1  . montelukast (SINGULAIR) 10 MG tablet Take 10 mg by mouth at bedtime.    . nitroGLYCERIN (NITROSTAT) 0.4 MG SL tablet Place 1 tablet (0.4 mg total) under the tongue every 5 (five) minutes as needed for chest pain. 30 tablet 0  . ondansetron (ZOFRAN ODT)  4 MG disintegrating tablet Take 1 tablet (4 mg total) by mouth every 8 (eight) hours as needed for nausea or vomiting. 20 tablet 1  . pantoprazole (PROTONIX) 40 MG tablet Take 40 mg by mouth 2 (two) times daily as needed.    . Simethicone (PHAZYME PO) Take 1 tablet by mouth as needed (upset stomach).    . sucralfate (CARAFATE) 1 GM/10ML suspension Take 1 g by mouth as needed.     No current facility-administered medications on file prior to visit.    Allergies  Allergen Reactions  . Crestor [Rosuvastatin Calcium] Other (See Comments)    Severe myalgias and joint pain. Has tolerated atorvastatin though  . Peanut-Containing Drug Products Other (See Comments)  Vomiting, upset stomach and some wheezing  . Ciprofloxin Hcl [Ciprofloxacin] Nausea And Vomiting and Other (See Comments)    Severe migraine  . Food Color Red [Red Dye] Diarrhea    Assessment/Plan:  1. Hyperlipidemia - LDL above goal of < 55 mg/dL due to extensive ASCVD history and family history. Will stop atorvastatin 80 mg and ezetimibe 10 mg due to intolerable myalgias. Will start lower atorvastatin 20 mg once daily. The prescription was sent to [facility]. Discussed PCSK9i therapy including mechanism of action, expected benefits, side effects, and injection technique. Will apply for patient assistance since pt is uninsured and once approved will start Praluent 75 mg every 14 days. We will contact patient about approval and schedule follow-up at that time. Encouraged medication compliance, decrease use of butter, and continue exercise to reduce the risk of another CV event.  Julieta Bellini, PharmD Candidate  Megan E. Supple, PharmD, BCACP, [place] [id number], [place], [place] [address] Phone: [phone]; Fax: [phone] 02/19/2019 10:58 AM

## 2019-02-19 ENCOUNTER — Other Ambulatory Visit: Payer: Self-pay

## 2019-02-19 ENCOUNTER — Ambulatory Visit (INDEPENDENT_AMBULATORY_CARE_PROVIDER_SITE_OTHER): Payer: Self-pay | Admitting: Pharmacist

## 2019-02-19 ENCOUNTER — Other Ambulatory Visit: Payer: Self-pay | Admitting: *Deleted

## 2019-02-19 DIAGNOSIS — E785 Hyperlipidemia, unspecified: Secondary | ICD-10-CM

## 2019-02-19 DIAGNOSIS — Z79899 Other long term (current) drug therapy: Secondary | ICD-10-CM

## 2019-02-19 DIAGNOSIS — E7800 Pure hypercholesterolemia, unspecified: Secondary | ICD-10-CM

## 2019-02-19 LAB — LIPID PANEL
Chol/HDL Ratio: 2.5 ratio (ref 0.0–4.4)
Cholesterol, Total: 155 mg/dL (ref 100–199)
HDL: 63 mg/dL (ref >39)
LDL Chol Calc (NIH): 75 mg/dL (ref 0–99)
Triglycerides: 91 mg/dL (ref 0–149)
VLDL Cholesterol Cal: 17 mg/dL (ref 5–40)

## 2019-02-19 LAB — ALT: ALT: 20 IU/L (ref 0–32)

## 2019-02-19 MED ORDER — ATORVASTATIN CALCIUM 20 MG PO TABS: 20.0000 mg | ORAL_TABLET | Freq: Every day | ORAL | 3 refills | Status: DC

## 2019-02-19 MED FILL — ?ATORVASTATIN 20 MG TABLET: 20 | 30 days supply | Qty: 30 | Fill #0

## 2019-02-19 NOTE — Patient Instructions (Addendum)
It was nice seeing you today!!  STOP taking atorvastatin 80 mg and ezetimibe 10 mg.  START taking atorvastatin 20 mg once daily.   We will call you when we hear back from the Patient Assistance Program. Once you are approved, START taking Praluent 75 mg once every 14 days. You can schedule your injection for the 1st and 15th of every month.   Please call us at [phone] if you have any questions.

## 2019-03-04 ENCOUNTER — Other Ambulatory Visit: Payer: Self-pay | Admitting: Cardiology

## 2019-03-07 ENCOUNTER — Encounter: Payer: Self-pay | Admitting: *Deleted

## 2019-03-07 DIAGNOSIS — Z006 Encounter for examination for normal comparison and control in clinical research program: Secondary | ICD-10-CM

## 2019-03-07 NOTE — Research (Signed)
V11  Patient doing well, no complaints of chest pains or shortness of breath. She says her sugars have been running pretty good lately. I thanked her for participating in the research study.                                    "CONSENT"   YES     NO   Continuing further Investigational Product and study visits for follow-up? [x]  []   Continuing consent from future biomedical research [x]  []                                   "EVENTS"    YES     NO  AE   (IF YES SEE SOURCE) []  [x]   SAE  (IF YES SEE SOURCE) []  [x]   ENDPOINT   (IF YES SEE SOURCE) []  [x]   REVASCULARIZATION  (IF YES SEE SOURCE) []  [x]   AMPUTATION   (IF YES SEE SOURCE) []  [x]   TROPONIN'S  (IF YES SEE SOURCE) []  [x]    Lifestyle Adherence Assessment:    YES NO  Abstinence from smoking/remaining tobacco free X   Cardiac Diet X   Routine physical activity and/or cardiac rehabilitation X     Current Outpatient Medications:  .  acetaminophen (TYLENOL) 500 MG tablet, Take 500 mg by mouth as needed., Disp: , Rfl:  .  albuterol (VENTOLIN HFA) 108 (90 Base) MCG/ACT inhaler, Inhale 2 puffs into the lungs every 6 (six) hours as needed for wheezing or shortness of breath., Disp: 18 g, Rfl: 0 .  aspirin 81 MG EC tablet, Take 1 tablet (81 mg total) by mouth daily., Disp: 90 tablet, Rfl: 0 .  atorvastatin (LIPITOR) 20 MG tablet, Take 1 tablet (20 mg total) by mouth daily., Disp: 90 tablet, Rfl: 3 .  clopidogrel (PLAVIX) 75 MG tablet, Take 1 tablet (75 mg total) by mouth daily., Disp: 90 tablet, Rfl: 3 .  FLUoxetine (PROZAC) 40 MG capsule, Take 1 capsule (40 mg total) by mouth daily., Disp: 30 capsule, Rfl: 2 .  ibuprofen (ADVIL) 800 MG tablet, Take 800 mg by mouth as needed., Disp: , Rfl:  .  insulin aspart (NOVOLOG) 100 UNIT/ML injection, 35 units per insulin pump daily, Disp: 10 mL, Rfl: 1 .  ipratropium (ATROVENT) 0.06 % nasal spray, Place 2 sprays into both nostrils 2 (two) times daily as needed for rhinitis., Disp: , Rfl:  .  isosorbide  mononitrate (IMDUR) 30 MG 24 hr tablet, Take 0.5 tablets (15 mg total) by mouth daily., Disp: 45 tablet, Rfl: 3 .  levothyroxine (EUTHYROX) 100 MCG tablet, Take 1 tablet (100 mcg total) by mouth daily before breakfast. Must have office visit for refills, Disp: 30 tablet, Rfl: 3 .  metoCLOPramide (REGLAN) 5 MG tablet, Take 1 tablet (5 mg total) by mouth every 12 (twelve) hours as needed for nausea or vomiting., Disp: 90 tablet, Rfl: 1 .  montelukast (SINGULAIR) 10 MG tablet, Take 10 mg by mouth at bedtime., Disp: , Rfl:  .  nitroGLYCERIN (NITROSTAT) 0.4 MG SL tablet, Place 1 tablet (0.4 mg total) under the tongue every 5 (five) minutes as needed for chest pain., Disp: 30 tablet, Rfl: 0 .  ondansetron (ZOFRAN ODT) 4 MG disintegrating tablet, Take 1 tablet (4 mg total) by mouth every 8 (eight) hours as needed for nausea or vomiting., Disp: 20 tablet, Rfl:  1 .  pantoprazole (PROTONIX) 40 MG tablet, Take 40 mg by mouth 2 (two) times daily as needed., Disp: , Rfl:  .  Simethicone (PHAZYME PO), Take 1 tablet by mouth as needed (upset stomach)., Disp: , Rfl:  .  sucralfate (CARAFATE) 1 GM/10ML suspension, Take 1 g by mouth as needed., Disp: , Rfl:

## 2019-03-10 NOTE — Telephone Encounter (Signed)
Spoke with patient see research encounter  ?

## 2019-03-13 ENCOUNTER — Other Ambulatory Visit: Payer: Self-pay | Admitting: Physician Assistant

## 2019-03-14 ENCOUNTER — Encounter: Payer: Self-pay | Admitting: Nurse Practitioner

## 2019-03-15 ENCOUNTER — Encounter (HOSPITAL_COMMUNITY): Payer: Self-pay | Admitting: Emergency Medicine

## 2019-03-15 ENCOUNTER — Other Ambulatory Visit: Payer: Self-pay

## 2019-03-15 ENCOUNTER — Observation Stay (HOSPITAL_COMMUNITY)
Admission: EM | Admit: 2019-03-15 | Discharge: 2019-03-16 | Disposition: A | Discharge status: Home / Self Care | Payer: Self-pay | Attending: Internal Medicine | Admitting: Internal Medicine

## 2019-03-15 DIAGNOSIS — I255 Ischemic cardiomyopathy: Secondary | ICD-10-CM | POA: Insufficient documentation

## 2019-03-15 DIAGNOSIS — Z7902 Long term (current) use of antithrombotics/antiplatelets: Secondary | ICD-10-CM | POA: Insufficient documentation

## 2019-03-15 DIAGNOSIS — E874 Mixed disorder of acid-base balance: Secondary | ICD-10-CM | POA: Insufficient documentation

## 2019-03-15 DIAGNOSIS — F329 Major depressive disorder, single episode, unspecified: Secondary | ICD-10-CM | POA: Insufficient documentation

## 2019-03-15 DIAGNOSIS — Z7982 Long term (current) use of aspirin: Secondary | ICD-10-CM | POA: Insufficient documentation

## 2019-03-15 DIAGNOSIS — Z20822 Contact with and (suspected) exposure to covid-19: Secondary | ICD-10-CM | POA: Insufficient documentation

## 2019-03-15 DIAGNOSIS — Z79899 Other long term (current) drug therapy: Secondary | ICD-10-CM | POA: Insufficient documentation

## 2019-03-15 DIAGNOSIS — Z951 Presence of aortocoronary bypass graft: Secondary | ICD-10-CM | POA: Insufficient documentation

## 2019-03-15 DIAGNOSIS — I2510 Atherosclerotic heart disease of native coronary artery without angina pectoris: Secondary | ICD-10-CM | POA: Insufficient documentation

## 2019-03-15 DIAGNOSIS — I252 Old myocardial infarction: Secondary | ICD-10-CM | POA: Insufficient documentation

## 2019-03-15 DIAGNOSIS — R000 Tachycardia, unspecified: Secondary | ICD-10-CM | POA: Insufficient documentation

## 2019-03-15 DIAGNOSIS — N179 Acute kidney failure, unspecified: Secondary | ICD-10-CM | POA: Insufficient documentation

## 2019-03-15 DIAGNOSIS — Z79890 Hormone replacement therapy (postmenopausal): Secondary | ICD-10-CM | POA: Insufficient documentation

## 2019-03-15 DIAGNOSIS — Z9641 Presence of insulin pump (external) (internal): Secondary | ICD-10-CM | POA: Insufficient documentation

## 2019-03-15 DIAGNOSIS — Z794 Long term (current) use of insulin: Secondary | ICD-10-CM | POA: Insufficient documentation

## 2019-03-15 DIAGNOSIS — Z881 Allergy status to other antibiotic agents status: Secondary | ICD-10-CM | POA: Insufficient documentation

## 2019-03-15 DIAGNOSIS — K219 Gastro-esophageal reflux disease without esophagitis: Secondary | ICD-10-CM | POA: Insufficient documentation

## 2019-03-15 DIAGNOSIS — Z91048 Other nonmedicinal substance allergy status: Secondary | ICD-10-CM

## 2019-03-15 DIAGNOSIS — E1065 Type 1 diabetes mellitus with hyperglycemia: Principal | ICD-10-CM | POA: Insufficient documentation

## 2019-03-15 DIAGNOSIS — Z955 Presence of coronary angioplasty implant and graft: Secondary | ICD-10-CM | POA: Insufficient documentation

## 2019-03-15 DIAGNOSIS — R112 Nausea with vomiting, unspecified: Secondary | ICD-10-CM | POA: Diagnosis present

## 2019-03-15 DIAGNOSIS — I5022 Chronic systolic (congestive) heart failure: Secondary | ICD-10-CM | POA: Insufficient documentation

## 2019-03-15 DIAGNOSIS — E785 Hyperlipidemia, unspecified: Secondary | ICD-10-CM | POA: Insufficient documentation

## 2019-03-15 DIAGNOSIS — I959 Hypotension, unspecified: Secondary | ICD-10-CM | POA: Insufficient documentation

## 2019-03-15 DIAGNOSIS — E1165 Type 2 diabetes mellitus with hyperglycemia: Secondary | ICD-10-CM | POA: Diagnosis present

## 2019-03-15 DIAGNOSIS — Z791 Long term (current) use of non-steroidal anti-inflammatories (NSAID): Secondary | ICD-10-CM

## 2019-03-15 DIAGNOSIS — E039 Hypothyroidism, unspecified: Secondary | ICD-10-CM | POA: Diagnosis present

## 2019-03-15 DIAGNOSIS — F419 Anxiety disorder, unspecified: Secondary | ICD-10-CM | POA: Insufficient documentation

## 2019-03-15 DIAGNOSIS — F1290 Cannabis use, unspecified, uncomplicated: Secondary | ICD-10-CM

## 2019-03-15 DIAGNOSIS — K3184 Gastroparesis: Secondary | ICD-10-CM | POA: Insufficient documentation

## 2019-03-15 DIAGNOSIS — E7800 Pure hypercholesterolemia, unspecified: Secondary | ICD-10-CM | POA: Insufficient documentation

## 2019-03-15 DIAGNOSIS — E1043 Type 1 diabetes mellitus with diabetic autonomic (poly)neuropathy: Secondary | ICD-10-CM | POA: Insufficient documentation

## 2019-03-15 DIAGNOSIS — Z91010 Allergy to peanuts: Secondary | ICD-10-CM

## 2019-03-15 DIAGNOSIS — E1010 Type 1 diabetes mellitus with ketoacidosis without coma: Secondary | ICD-10-CM

## 2019-03-15 DIAGNOSIS — Z888 Allergy status to other drugs, medicaments and biological substances status: Secondary | ICD-10-CM | POA: Insufficient documentation

## 2019-03-15 LAB — BASIC METABOLIC PANEL
Anion gap: 17 — ABNORMAL HIGH (ref 5–15)
Anion gap: 13 (ref 5–15)
Anion gap: 13 (ref 5–15)
Anion gap: 12 (ref 5–15)
BUN: 18 mg/dL (ref 6–20)
BUN: 16 mg/dL (ref 6–20)
BUN: 14 mg/dL (ref 6–20)
BUN: 15 mg/dL (ref 6–20)
CO2: 17 mmol/L — ABNORMAL LOW (ref 22–32)
CO2: 16 mmol/L — ABNORMAL LOW (ref 22–32)
CO2: 17 mmol/L — ABNORMAL LOW (ref 22–32)
CO2: 17 mmol/L — ABNORMAL LOW (ref 22–32)
Calcium: 9.9 mg/dL (ref 8.9–10.3)
Calcium: 8.3 mg/dL — ABNORMAL LOW (ref 8.9–10.3)
Calcium: 8.8 mg/dL — ABNORMAL LOW (ref 8.9–10.3)
Calcium: 8.6 mg/dL — ABNORMAL LOW (ref 8.9–10.3)
Chloride: 100 mmol/L (ref 98–111)
Chloride: 106 mmol/L (ref 98–111)
Chloride: 106 mmol/L (ref 98–111)
Chloride: 103 mmol/L (ref 98–111)
Creatinine, Ser: 1.23 mg/dL — ABNORMAL HIGH (ref 0.44–1.00)
Creatinine, Ser: 1.06 mg/dL — ABNORMAL HIGH (ref 0.44–1.00)
Creatinine, Ser: 0.83 mg/dL (ref 0.44–1.00)
Creatinine, Ser: 0.91 mg/dL (ref 0.44–1.00)
GFR calc Af Amer: 59 mL/min — ABNORMAL LOW (ref >60)
GFR calc Af Amer: >60 mL/min (ref >60)
GFR calc Af Amer: >60 mL/min (ref >60)
GFR calc Af Amer: >60 mL/min (ref >60)
GFR calc non Af Amer: 51 mL/min — ABNORMAL LOW (ref >60)
GFR calc non Af Amer: >60 mL/min (ref >60)
GFR calc non Af Amer: >60 mL/min (ref >60)
GFR calc non Af Amer: >60 mL/min (ref >60)
Glucose, Bld: 346 mg/dL — ABNORMAL HIGH (ref 70–99)
Glucose, Bld: 139 mg/dL — ABNORMAL HIGH (ref 70–99)
Glucose, Bld: 168 mg/dL — ABNORMAL HIGH (ref 70–99)
Glucose, Bld: 330 mg/dL — ABNORMAL HIGH (ref 70–99)
Potassium: 3.3 mmol/L — ABNORMAL LOW (ref 3.5–5.1)
Potassium: 4.2 mmol/L (ref 3.5–5.1)
Potassium: 4.1 mmol/L (ref 3.5–5.1)
Potassium: 4.8 mmol/L (ref 3.5–5.1)
Sodium: 134 mmol/L — ABNORMAL LOW (ref 135–145)
Sodium: 135 mmol/L (ref 135–145)
Sodium: 136 mmol/L (ref 135–145)
Sodium: 132 mmol/L — ABNORMAL LOW (ref 135–145)

## 2019-03-15 LAB — CBC
HCT: 39.5 % (ref 36.0–46.0)
Hemoglobin: 13 g/dL (ref 12.0–15.0)
MCH: 28.1 pg (ref 26.0–34.0)
MCHC: 32.9 g/dL (ref 30.0–36.0)
MCV: 85.3 fL (ref 80.0–100.0)
Platelets: 510 10*3/uL — ABNORMAL HIGH (ref 150–400)
RBC: 4.63 MIL/uL (ref 3.87–5.11)
RDW: 15.7 % — ABNORMAL HIGH (ref 11.5–15.5)
WBC: 14.6 10*3/uL — ABNORMAL HIGH (ref 4.0–10.5)
nRBC: 0 % (ref 0.0–0.2)

## 2019-03-15 LAB — I-STAT CHEM 8, ED
BUN: 21 mg/dL — ABNORMAL HIGH (ref 6–20)
Calcium, Ion: 1.2 mmol/L (ref 1.15–1.40)
Chloride: 104 mmol/L (ref 98–111)
Creatinine, Ser: 0.9 mg/dL (ref 0.44–1.00)
Glucose, Bld: 331 mg/dL — ABNORMAL HIGH (ref 70–99)
HCT: 43 % (ref 36.0–46.0)
Hemoglobin: 14.6 g/dL (ref 12.0–15.0)
Potassium: 3.4 mmol/L — ABNORMAL LOW (ref 3.5–5.1)
Sodium: 136 mmol/L (ref 135–145)
TCO2: 18 mmol/L — ABNORMAL LOW (ref 22–32)

## 2019-03-15 LAB — HEMOGLOBIN A1C
Hgb A1c MFr Bld: 9.7 % — ABNORMAL HIGH (ref 4.8–5.6)
Hgb A1c MFr Bld: 9.7 % — ABNORMAL HIGH (ref 4.8–5.6)
Mean Plasma Glucose: 231.69 mg/dL
Mean Plasma Glucose: 231.69 mg/dL

## 2019-03-15 LAB — CBG MONITORING, ED
Glucose-Capillary: 329 mg/dL — ABNORMAL HIGH (ref 70–99)
Glucose-Capillary: 338 mg/dL — ABNORMAL HIGH (ref 70–99)
Glucose-Capillary: 278 mg/dL — ABNORMAL HIGH (ref 70–99)
Glucose-Capillary: 171 mg/dL — ABNORMAL HIGH (ref 70–99)
Glucose-Capillary: 134 mg/dL — ABNORMAL HIGH (ref 70–99)

## 2019-03-15 LAB — POCT I-STAT EG7
Acid-base deficit: 5 mmol/L — ABNORMAL HIGH (ref 0.0–2.0)
Bicarbonate: 17.2 mmol/L — ABNORMAL LOW (ref 20.0–28.0)
Calcium, Ion: 1.15 mmol/L (ref 1.15–1.40)
HCT: 43 % (ref 36.0–46.0)
Hemoglobin: 14.6 g/dL (ref 12.0–15.0)
O2 Saturation: 74 %
Potassium: 3.4 mmol/L — ABNORMAL LOW (ref 3.5–5.1)
Sodium: 137 mmol/L (ref 135–145)
TCO2: 18 mmol/L — ABNORMAL LOW (ref 22–32)
pCO2, Ven: 25.7 mmHg — ABNORMAL LOW (ref 44.0–60.0)
pH, Ven: 7.433 — ABNORMAL HIGH (ref 7.250–7.430)
pO2, Ven: 37 mmHg (ref 32.0–45.0)

## 2019-03-15 LAB — GLUCOSE, CAPILLARY
Glucose-Capillary: 138 mg/dL — ABNORMAL HIGH (ref 70–99)
Glucose-Capillary: 124 mg/dL — ABNORMAL HIGH (ref 70–99)
Glucose-Capillary: 147 mg/dL — ABNORMAL HIGH (ref 70–99)
Glucose-Capillary: 169 mg/dL — ABNORMAL HIGH (ref 70–99)
Glucose-Capillary: 157 mg/dL — ABNORMAL HIGH (ref 70–99)
Glucose-Capillary: 150 mg/dL — ABNORMAL HIGH (ref 70–99)

## 2019-03-15 LAB — I-STAT BETA HCG BLOOD, ED (MC, WL, AP ONLY): I-stat hCG, quantitative: <5 m[IU]/mL (ref <5)

## 2019-03-15 LAB — BETA-HYDROXYBUTYRIC ACID: Beta-Hydroxybutyric Acid: 0.24 mmol/L (ref 0.05–0.27)

## 2019-03-15 LAB — SARS CORONAVIRUS 2 (TAT 6-24 HRS): SARS Coronavirus 2: NEGATIVE

## 2019-03-15 MED ORDER — ACETAMINOPHEN 650 MG RE SUPP: 650.0000 mg | Freq: Four times a day (QID) | RECTAL | Status: DC | PRN

## 2019-03-15 MED ORDER — ALBUTEROL SULFATE (2.5 MG/3ML) 0.083% IN NEBU: 2.5000 mg | INHALATION_SOLUTION | Freq: Four times a day (QID) | RESPIRATORY_TRACT | Status: DC | PRN

## 2019-03-15 MED ORDER — INSULIN ASPART 100 UNIT/ML ~~LOC~~ SOLN: 0.0000 [IU] | Freq: Three times a day (TID) | SUBCUTANEOUS | Status: DC

## 2019-03-15 MED ORDER — SIMETHICONE 80 MG PO CHEW: 80.0000 mg | CHEWABLE_TABLET | ORAL | Status: DC | PRN

## 2019-03-15 MED ORDER — ALUM & MAG HYDROXIDE-SIMETH 200-200-20 MG/5ML PO SUSP
30.0000 mL | Freq: Once | ORAL | Status: AC
  Administered 2019-03-15: 30 mL via ORAL
  Filled 2019-03-15: qty 30

## 2019-03-15 MED ORDER — ASPIRIN EC 81 MG PO TBEC
81.0000 mg | DELAYED_RELEASE_TABLET | Freq: Every day | ORAL | Status: DC
  Administered 2019-03-16: 08:00:00 81 mg via ORAL
  Filled 2019-03-15: qty 1

## 2019-03-15 MED ORDER — SUCRALFATE 1 GM/10ML PO SUSP: 1.0000 g | ORAL | Status: DC | PRN

## 2019-03-15 MED ORDER — HYDROMORPHONE HCL 1 MG/ML IJ SOLN
1.0000 mg | Freq: Once | INTRAMUSCULAR | Status: AC
  Administered 2019-03-15: 1 mg via INTRAVENOUS
  Filled 2019-03-15: qty 1

## 2019-03-15 MED ORDER — SODIUM CHLORIDE 0.45 % IV SOLN
INTRAVENOUS | Status: DC
  Filled 2019-03-15: qty 1000

## 2019-03-15 MED ORDER — MORPHINE SULFATE (PF) 4 MG/ML IV SOLN
4.0000 mg | Freq: Once | INTRAVENOUS | Status: AC
  Administered 2019-03-15: 12:00:00 4 mg via INTRAVENOUS
  Filled 2019-03-15: qty 1

## 2019-03-15 MED ORDER — SODIUM CHLORIDE 0.9 % IV BOLUS (SEPSIS)
1000.0000 mL | Freq: Once | INTRAVENOUS | Status: AC
  Administered 2019-03-15: 1000 mL via INTRAVENOUS

## 2019-03-15 MED ORDER — METOCLOPRAMIDE HCL 5 MG PO TABS
5.0000 mg | ORAL_TABLET | Freq: Two times a day (BID) | ORAL | Status: DC | PRN
  Filled 2019-03-15: qty 1

## 2019-03-15 MED ORDER — DEXTROSE-NACL 5-0.45 % IV SOLN: INTRAVENOUS | Status: DC

## 2019-03-15 MED ORDER — SODIUM CHLORIDE 0.9 % IV SOLN: INTRAVENOUS | Status: DC

## 2019-03-15 MED ORDER — ONDANSETRON HCL 4 MG/2ML IJ SOLN
4.0000 mg | Freq: Once | INTRAMUSCULAR | Status: AC
  Administered 2019-03-15: 10:00:00 4 mg via INTRAVENOUS
  Filled 2019-03-15: qty 2

## 2019-03-15 MED ORDER — ACETAMINOPHEN 325 MG PO TABS: 650.0000 mg | ORAL_TABLET | Freq: Four times a day (QID) | ORAL | Status: DC | PRN

## 2019-03-15 MED ORDER — DICYCLOMINE HCL 10 MG/5ML PO SOLN
10.0000 mg | Freq: Once | ORAL | Status: DC
  Filled 2019-03-15: qty 5

## 2019-03-15 MED ORDER — FLUOXETINE HCL 20 MG PO CAPS
40.0000 mg | ORAL_CAPSULE | Freq: Every day | ORAL | Status: DC
  Administered 2019-03-16: 40 mg via ORAL
  Filled 2019-03-15 (×2): qty 2

## 2019-03-15 MED ORDER — LACTATED RINGERS IV BOLUS
1000.0000 mL | Freq: Once | INTRAVENOUS | Status: AC
  Administered 2019-03-15: 1000 mL via INTRAVENOUS

## 2019-03-15 MED ORDER — POTASSIUM CHLORIDE 10 MEQ/100ML IV SOLN
10.0000 meq | INTRAVENOUS | Status: AC
  Administered 2019-03-15 (×3): 10 meq via INTRAVENOUS
  Filled 2019-03-15 (×4): qty 100

## 2019-03-15 MED ORDER — INSULIN GLARGINE 100 UNIT/ML ~~LOC~~ SOLN
8.0000 [IU] | Freq: Every day | SUBCUTANEOUS | Status: DC
  Administered 2019-03-15: 8 [IU] via SUBCUTANEOUS
  Filled 2019-03-15 (×2): qty 0.08

## 2019-03-15 MED ORDER — PANTOPRAZOLE SODIUM 40 MG PO TBEC
40.0000 mg | DELAYED_RELEASE_TABLET | Freq: Every day | ORAL | Status: DC
  Administered 2019-03-16: 40 mg via ORAL
  Filled 2019-03-15 (×2): qty 1

## 2019-03-15 MED ORDER — INSULIN REGULAR(HUMAN) IN NACL 100-0.9 UT/100ML-% IV SOLN
INTRAVENOUS | Status: DC
  Administered 2019-03-15: 1.5 [IU]/h via INTRAVENOUS
  Administered 2019-03-15: 1.6 [IU]/h via INTRAVENOUS

## 2019-03-15 MED ORDER — INSULIN ASPART 100 UNIT/ML ~~LOC~~ SOLN: 5.0000 [IU] | Freq: Three times a day (TID) | SUBCUTANEOUS | Status: DC

## 2019-03-15 MED ORDER — MONTELUKAST SODIUM 10 MG PO TABS
10.0000 mg | ORAL_TABLET | Freq: Every day | ORAL | Status: DC
  Administered 2019-03-15: 10 mg via ORAL
  Filled 2019-03-15: qty 1

## 2019-03-15 MED ORDER — LEVOTHYROXINE SODIUM 100 MCG PO TABS
100.0000 ug | ORAL_TABLET | Freq: Every day | ORAL | Status: DC
  Administered 2019-03-16: 100 ug via ORAL
  Filled 2019-03-15: qty 1

## 2019-03-15 MED ORDER — INSULIN REGULAR(HUMAN) IN NACL 100-0.9 UT/100ML-% IV SOLN
INTRAVENOUS | Status: DC
  Administered 2019-03-15: 10 [IU]/h via INTRAVENOUS
  Filled 2019-03-15: qty 100

## 2019-03-15 MED ORDER — DEXTROSE 50 % IV SOLN: 0.0000 mL | INTRAVENOUS | Status: DC | PRN

## 2019-03-15 MED ORDER — CLOPIDOGREL BISULFATE 75 MG PO TABS
75.0000 mg | ORAL_TABLET | Freq: Every day | ORAL | Status: DC
  Administered 2019-03-15 – 2019-03-16 (×2): 75 mg via ORAL
  Filled 2019-03-15 (×2): qty 1

## 2019-03-15 MED ORDER — ENOXAPARIN SODIUM 40 MG/0.4ML ~~LOC~~ SOLN
40.0000 mg | SUBCUTANEOUS | Status: DC
  Administered 2019-03-15: 40 mg via SUBCUTANEOUS
  Filled 2019-03-15: qty 0.4

## 2019-03-15 MED ORDER — IPRATROPIUM BROMIDE 0.06 % NA SOLN
2.0000 | Freq: Two times a day (BID) | NASAL | Status: DC | PRN
  Filled 2019-03-15: qty 15

## 2019-03-15 MED ORDER — ISOSORBIDE MONONITRATE ER 30 MG PO TB24
15.0000 mg | ORAL_TABLET | Freq: Every day | ORAL | Status: DC
  Administered 2019-03-16: 15 mg via ORAL
  Filled 2019-03-15: qty 1

## 2019-03-15 MED ORDER — PROMETHAZINE HCL 25 MG PO TABS: 12.5000 mg | ORAL_TABLET | Freq: Four times a day (QID) | ORAL | Status: DC | PRN

## 2019-03-15 MED ORDER — DROPERIDOL 2.5 MG/ML IJ SOLN
1.2500 mg | Freq: Once | INTRAMUSCULAR | Status: AC
  Administered 2019-03-15: 1.25 mg via INTRAVENOUS
  Filled 2019-03-15: qty 2

## 2019-03-15 MED ORDER — POTASSIUM CHLORIDE 20 MEQ/15ML (10%) PO SOLN
40.0000 meq | Freq: Once | ORAL | Status: AC
  Administered 2019-03-15: 40 meq via ORAL
  Filled 2019-03-15: qty 30

## 2019-03-15 MED ORDER — ATORVASTATIN CALCIUM 10 MG PO TABS
20.0000 mg | ORAL_TABLET | Freq: Every day | ORAL | Status: DC
  Administered 2019-03-16: 08:00:00 20 mg via ORAL
  Filled 2019-03-15 (×3): qty 2

## 2019-03-15 NOTE — H&P (Addendum)
Date: 03/15/2019               Patient Name:  Jennifer Hogan MRN: [id number]  DOB: 1970/10/25 Age / Sex: 49 y.o., female   PCP: [name], NP              Medical Service: Internal Medicine Teaching Service              Attending Physician: Dr. Rebeca Alert Raynaldo Opitz, MD    First Contact: Bonnetta Barry, MS 4 Pager: [phone]  Second Contact: Dr. Lars Mage              After Hours (After 5p/  First Contact Pager: [phone]  weekends / holidays): Second Contact Pager: [phone]   Chief Complaint: Vomiting/nausea  History of Present Illness:  Jennifer Hogan is a 49 yo female with medical history of hypothyroidism, marijuana use, depression, NSTEMI (Jan 2020) s/p stenting ostial to proximal RCA, STEMI s/p CABG (Oct 2020), pure hypercholesterolemia, gastroparesis due to DM, CAD, ischemic cardiomyopathy, chronic systolic heart failure (EF 45-50%), chronic Type 1 DM on insulin pump who presented to the ED today with nausea/vomiting and hyperglycemia.    Patient states sx of nausea/vomiting began two days ago (Thursday).  Since then she has not been able to tolerate solid foods, but has been able to keep down liquids.  She also experienced dizziness, headaches, SOB, and increased urinary frequency.  She increased her pump setting yesterday to compensate for hyperglycemia, but does not know what level she increased it to.  She currently diffuse abdominal pain, but denies nausea during the interview.  Last time she vomited was earlier this morning.  She has had some sternal CP, which remains localized without radiation.  Denies dysuria, incomplete emptying of bladder.  She denies recent illness or sick contacts.  No known COVID contacts.  She does not know what triggered this episode.  Denies recent changes in her diet or medication changes. Other than increasing pump settings yesterday, she has not made other adjustments to her pump.    Per last endo note (01/09/19), she has been on pump  (medtronic paradigm) rx since 2014; she stopped continuous glucose monitor due to cost; rx is limited by lack of health insurance.  They were unable to interpret cbg's due to frequent adjustments.   She has been trying to adhere to a Heart Smart diet.  She smokes marijuana about 5 times per week.    Home Meds:  -tylenol 500 mg PRN -albuterol 2 puffs q6h -asa 81 mg daily -atorvastatin 20 mg daily -plavix 75 mg daily -fluoxetine 40 mg daily -ibuprofen 800 mg PRN -Novolog 100 35 units per insulin pump daily -ipratropium 2 sprays each nostril daily PRN -levothyroxine 100 mcg daily -metaclopramide 5 mg daily -montelukast 10 mg daily -nitroglycerine 0.4 mg SL every 5 min PRN -pantoprazole 40 mg daily Simethicone 1 tablet prn Sucralfate 1 g prn  Allergies: Allergies as of 03/15/2019 - Review Complete 03/15/2019  Allergen Reaction Noted   Atorvastatin  02/19/2019   Crestor [rosuvastatin calcium] Other (See Comments) 10/31/2018   Peanut-containing drug products Other (See Comments) 10/30/2018   Zetia [ezetimibe]  02/19/2019   Ciprofloxin hcl [ciprofloxacin] Nausea And Vomiting and Other (See Comments) 07/31/2012   Food color red [red dye] Diarrhea 07/31/2012   Past Medical History:  Diagnosis Date   Anemia    Anxiety    Coronary artery disease    a. s/p PTCA DES x3 in the LAD (ostial DES placed, mid  DES placed, distal DES placed) and PTCA/DES x1 to intermediate branch 02/2017. b. ACS 02/08/18 with LCx stent placement.   Depression    Diabetes mellitus    type 1, on insulin pump   DKA (diabetic ketoacidoses) ([facility]) 12/31/2017   Elevated platelet count    High cholesterol    Hypothyroidism    Ischemic cardiomyopathy    a. EF low-normal with basal inferior akinesis, basal septal hypokinesis by echo 01/2018.    Family History:  Father (Deceased):  Asthma, CHF, CAD Mother (Living):  T cell lymphoma  Social History:  Patient moved to [facility] in 2007 because her Dad was living in [place] at  that time.  Her mother lives in [place] and and she has 4 sisters, some of which also live in [place].  She does not have children.  She lives alone.  She does not have family nearby, but she says an older couple Risk manager and [place]) check in on her and she sees them about 5x/week.  They live about 20 minutes away.  Patient has 2 horses and 2 cats.  Patient does not drink alcohol or use tobacco products.  She smokes marijuana 5 times weekly.  Does not use other recreational or illicit drugs.  Review of Systems: A complete ROS was negative except as per HPI.   Physical Exam: Blood pressure (!) 103/54, pulse (!) 106, temperature 98.6 F (37 C), temperature source Oral, resp. rate 20, height 5\' 5"  (1.651 m), weight 68 kg, last menstrual period 02/12/2019, SpO2 99 %.  Gen:  Ill-appearing, disheveled, in discomfort, restless HEENT:  Normocephalic, atraumatic Cardio:  Tachycardic, regular rhythm, no murmurs, rubs, or gallops Pulm:  CTAB, no wheezes or crackles Abd:  Diffusely tender to light palpation.  2 laparoscopic scars present Extremities:  No LE edema Skin:  Vertical scar at sternum Neuro:  Alert and oriented.  CN grossly intact Psych:  Anxious, agitated, responding with short answers  Labs: CBC Latest Ref Rng & Units 03/15/2019 03/15/2019 03/15/2019  WBC 4.0 - 10.5 K/uL - - 14.6(H)  Hemoglobin 12.0 - 15.0 g/dL 14.6 14.6 13.0  Hematocrit 36.0 - 46.0 % 43.0 43.0 39.5  Platelets 150 - 400 K/uL - - 510(H)   CMP Latest Ref Rng & Units 03/15/2019 03/15/2019 03/15/2019  Glucose 70 - 99 mg/dL 139(H) - 331(H)  BUN 6 - 20 mg/dL 16 - 21(H)  Creatinine 0.44 - 1.00 mg/dL 1.06(H) - 0.90  Sodium 135 - 145 mmol/L 135 137 136  Potassium 3.5 - 5.1 mmol/L 4.2 3.4(L) 3.4(L)  Chloride 98 - 111 mmol/L 106 - 104  CO2 22 - 32 mmol/L 16(L) - -  Calcium 8.9 - 10.3 mg/dL 8.3(L) - -  Total Protein 6.0 - 8.5 g/dL - - -  Total Bilirubin 0.0 - 1.2 mg/dL - - -  Alkaline Phos 39 - 117 IU/L - - -  AST 0 - 40 IU/L  - - -  ALT 0 - 32 IU/L - - -    Ref Range & Units 09:15  (03/15/19) 3 mo ago  (11/25/18) 3 mo ago  (11/24/18)  Beta-Hydroxybutyric Acid 0.05 - 0.27 mmol/L 0.24  0.11 CM  5.15High  CM     Ref Range & Units 09:45  (03/15/19)  pH, Ven 7.250 - 7.430 7.433High    pCO2, Ven 44.0 - 60.0 mmHg 25.7Low    pO2, Ven 32.0 - 45.0 mmHg 37.0   Bicarbonate 20.0 - 28.0 mmol/L 17.2Low    TCO2 22 - 32  mmol/L 18Low    O2 Saturation % 74.0   Acid-base deficit 0.0 - 2.0 mmol/L 5.0High    Sodium 135 - 145 mmol/L 137   Potassium 3.5 - 5.1 mmol/L 3.4Low    Calcium, Ion 1.15 - 1.40 mmol/L 1.15   HCT 36.0 - 46.0 % 43.0   Hemoglobin 12.0 - 15.0 g/dL 14.6   Patient temperature  HIDE   Sample type  VENOUS     Ref Range & Units 11:28 09:26  Glucose-Capillary 70 - 99 mg/dL 338High   329High      EKG 2/20 Sinus tachycardia Atrial premature complex Probable left atrial enlargement Borderline right axis deviation Borderline T abnormalities, inferior leads Baseline wander in lead(s) V2  CXR: not yet completed  Assessment & Plan by Problem: Active Problems:   Severe hyperglycemia due to diabetes mellitus [facility])  Ms. Komm is a 49 yo female with medical history of hypothyroidism, marijuana use, depression, NSTEMI (Jan 2020) s/p stenting ostial to proximal RCA, STEMI s/p CABG (Oct 2020), pure hypercholesterolemia, gastroparesis due to DM, CAD, ischemic cardiomyopathy, chronic systolic heart failure (EF 45-50%), chronic Type 1 DM on insulin pump who is being admitted for symptomatic hyperglycemia.   Symptomatic Hyperglycemia  Type 1 Diabetes:  A1C 9.7, up from 8.2 two months ago.  Patient does not meet criteria for DKA, as ketosis not present.  She does seem to have combined respiratory alkalosis (pH 7.433, pCO2 25.7 on vbg) and anion gap metabolic acidosis (bicarb 17.2, AG 17).  Blood glucose elevated to 338 and potassium low (3.4) in ED.  Was given 1 L bolus of NS x2, regular insulin drip (10u/hr), and IV  KCl 10 meq/hr.  Droperidol and zofran given in the ED for nausea.  Of note, hCG negative.  Possible that nausea and vomiting are due to cyclic vomiting syndrome.  -IV Insulin drip- adjust per Glucomander  -Fluids: NS when CBG> 250 and D5-0.5% NS when CBG <250 -Potassium: stop insulin if k<3.3, give 63meq/l potassium if k 3.3-5.0, if k>5 no potassium -q4hr bmp -UA pending -Follow-up with endocrinologist (last seen 01/09/19)  Per Dr. Cordelia Pen note 01/09/2019: Please continue these pump settings:  basal rate of 1.5 units/hr, 6 AM-10 PM, and 0.5 units/hr overnight.  Suspend for activity for 1-2 hrs No mealtime bolus.  correction bolus (which some people call "sensitivity," or "insulin sensitivity ratio," or just "isr") of 1 unit for each 60 by which your glucose exceeds 100.     ASCAD S/p NSTEMI with cath showing severe three-vessel CAD status post PTCA DES x3 in the LAD(ostial DES placed, mid DES placed, distal DES placed) and  PTCA/DES x 1 intermediate branch.  S/p CABG 11/2018 -  Continue ASA 81mg  daily, Plavix 75mg  daily, high dose statin and Imdur 15mg  daily  -  No ACE/ARB d/t issues with orthostatic hypotension  Ischemic DCM EF normalized at 55-60% by Echo 11/2018  Hyperlipidemia:  Last saw cardiologist on 1/27 and applied for patient assistance for Pralulent since pt is uninsured.  Once approved, will start Praluent 75 mg every 14 days. -  LDL goal < 70 -  Continue Atorvastatin 20mg  daily  Gastroparesis GERD: CT abdomen pelvis done on 11/24/18 showing no acute intra-abdominal abnormality.  Patient had a colonoscopy 2018 was normal as per patient. - metoCLOPramide 5 MG tablet q12h PRN - Continue zofran 4 MG q8h PRN -  Continue pantoprazole 40 mg daily and sucralfate oral solution 1 g PRN  Hypothyroidism:   TSH wnl 01/09/19 -  Continue home levothyroxine 100 mcg daily   Dispo: Admit patient to Inpatient with expected length of stay greater than 2 midnights.  Signed Johny Blamer, Medical Student 03/15/2019, 6:18 PM   Attestation for Student Documentation:  I personally was present and performed or re-performed the history, physical exam and medical decision-making activities of this service and have verified that the service and findings are accurately documented in the student's note.  Lars Mage, MD 03/15/2019, 6:41 PM

## 2019-03-15 NOTE — Progress Notes (Signed)
Pt transferred to 4E-14 via stretcher from ED. Pt walked to bed. CHG bath given. Tele applied, CCMD notified. Pt oriented to call bell, bed and room. Call bell within reach. VSS. Will continue to monitor.  Amanda Cockayne, RN

## 2019-03-15 NOTE — ED Notes (Signed)
Pt CBG was 171 notified Deneise Lever, Therapist, sports

## 2019-03-15 NOTE — ED Provider Notes (Signed)
[facility] Provider Note   CSN: [id number] Arrival date & time: 03/15/19  [id number]     History Chief Complaint  Patient presents with  . Hyperglycemia  . Emesis  . Nausea  . Chills    Jennifer Hogan is a 49 y.o. female.  Patient is a 49 year old female with past medical history of type 1 diabetes with insulin pump, gastroparesis, GERD, coronary artery disease, thyroid disorder presenting to the emergency department for hyperglycemia abdominal pain.  Patient reports this is been going on since last Thursday.  Patient reports she feels like she is in DKA and has had several episodes of vomiting despite taking her Zofran at home.  Has been admitted for similar in the past but has not yet had GI follow-up.  Denies any fever, chills, dysuria, diarrhea.        Past Medical History:  Diagnosis Date  . Anemia   . Anxiety   . Coronary artery disease    a. s/pPTCA DES x3 in the LAD (ostial DES placed, mid DES placed, distal DES placed)and PTCA/DES x1to intermediate branch 02/2017. b. ACS 02/08/18 with LCx stent placement.  . Depression   . Diabetes mellitus    type 1, on insulin pump  . DKA (diabetic ketoacidoses) ([place]) 12/31/2017  . Elevated platelet count   . High cholesterol   . Hypothyroidism   . Ischemic cardiomyopathy    a. EF low-normal with basal inferior akinesis, basal septal hypokinesis by echo 01/2018.    Patient Active Problem List   Diagnosis Date Noted  . Pleural effusion 12/09/2018  . Abdominal pain 11/28/2018  . Intractable nausea and vomiting 11/28/2018  . Diarrhea 11/28/2018  . DKA (diabetic ketoacidoses) ([place]) 11/25/2018  . Sinus tachycardia 11/24/2018  . Chronic systolic heart failure ([place])   . Cardiomyopathy ([place])   . Hypotension 10/29/2018  . DKA, type 1 ([place]) 05/24/2018  . Moderate episode of recurrent major depressive disorder ([place]) 03/27/2018  . Epigastric pain   . Hyperglycemia due to type 1 diabetes  mellitus ([place]) 02/14/2018  . Hyperlipidemia LDL goal <70 02/14/2018  . S/P drug eluting coronary stent placement   . ACS (acute coronary syndrome) ([place]) 02/08/2018  . ST elevation myocardial infarction (STEMI) ([place]) 02/08/2018  . DKA (diabetic ketoacidosis) ([place]) 12/31/2017  . Marijuana abuse 12/31/2017  . Gastroparesis 12/06/2017  . Gastroparesis due to DM ([place]) 03/28/2017  . Coronary artery disease 03/28/2017  . Ischemic cardiomyopathy 03/28/2017  . Pure hypercholesterolemia   . NSTEMI (non-ST elevated myocardial infarction) ([place])   . Anemia 03/17/2017  . Allergic state 08/07/2015  . Vitiligo 07/09/2012  . Type 1 diabetes mellitus ([place]) 07/09/2012  . Depression 12/04/2010  . Non-intractable vomiting with nausea 12/04/2010  . Hypothyroidism 12/04/2010    Past Surgical History:  Procedure Laterality Date  . APPENDECTOMY    . CORONARY ARTERY BYPASS GRAFT N/A 11/04/2018   Procedure: CORONARY ARTERY BYPASS GRAFTING (CABG), ON PUMP, TIMES THREE, USING LEFT AND RIGHT INTERNAL MAMMARY ARTERIES;  Surgeon: Wonda Olds, MD;  Location: [facility];  Service: Open Heart Surgery;  Laterality: N/A;  . CORONARY STENT INTERVENTION N/A 03/20/2017   Procedure: DES x 3 LAD, DES OM; Surgeon: Burnell Blanks, MD;  Location: [facility];  Service: Cardiovascular;  Laterality: N/A;  . CORONARY/GRAFT ACUTE MI REVASCULARIZATION N/A 02/08/2018   Procedure: Coronary/Graft Acute MI Revascularization;  Surgeon: Belva Crome, MD;  Location: [facility];  Service: Cardiovascular;  Laterality: N/A;  .  LEFT HEART CATH AND CORONARY ANGIOGRAPHY N/A 03/20/2017   Procedure: LEFT HEART CATH AND CORONARY ANGIOGRAPHY;  Surgeon: Burnell Blanks, MD;  Location: [facility];  Service: Cardiovascular;  Laterality: N/A;  . LEFT HEART CATH AND CORONARY ANGIOGRAPHY N/A 02/08/2018   Procedure: LEFT HEART CATH AND CORONARY ANGIOGRAPHY;  Surgeon: Belva Crome, MD;  Location: [facility];   Service: Cardiovascular;  Laterality: N/A;  . LEFT HEART CATH AND CORONARY ANGIOGRAPHY N/A 10/29/2018   Procedure: LEFT HEART CATH AND CORONARY ANGIOGRAPHY;  Surgeon: Burnell Blanks, MD;  Location: [facility];  Service: Cardiovascular;  Laterality: N/A;  . TEE WITHOUT CARDIOVERSION N/A 11/04/2018   Procedure: TRANSESOPHAGEAL ECHOCARDIOGRAM (TEE);  Surgeon: Wonda Olds, MD;  Location: [facility];  Service: Open Heart Surgery;  Laterality: N/A;     OB History    Gravida  0   Para  0   Term  0   Preterm  0   AB  0   Living  0     SAB  0   TAB  0   Ectopic  0   Multiple  0   Live Births  0           Family History  Problem Relation Age of Onset  . Coronary artery disease Father   . Congestive Heart Failure Father   . Asthma Father   . Cancer Mother        T cell lymphoma    Social History   Tobacco Use  . Smoking status: Never Smoker  . Smokeless tobacco: Never Used  . Tobacco comment: Use marijuana  Substance Use Topics  . Alcohol use: No    Alcohol/week: 0.0 standard drinks  . Drug use: Yes    Types: Marijuana    Comment: last use last week, occaisonal marijuana use    Home Medications Prior to Admission medications   Medication Sig Start Date End Date Taking? Authorizing Provider  acetaminophen (TYLENOL) 500 MG tablet Take 500 mg by mouth as needed.    [provider]  albuterol (VENTOLIN HFA) 108 (90 Base) MCG/ACT inhaler Inhale 2 puffs into the lungs every 6 (six) hours as needed for wheezing or shortness of breath. 12/17/18 01/16/19  Argentina Donovan, PA-C  aspirin 81 MG EC tablet Take 1 tablet (81 mg total) by mouth daily. 12/07/17   Amin, Jeanella Flattery, MD  atorvastatin (LIPITOR) 20 MG tablet Take 1 tablet (20 mg total) by mouth daily. 02/19/19 02/19/20  [name], MD  clopidogrel (PLAVIX) 75 MG tablet Take 1 tablet (75 mg total) by mouth daily. 03/05/19   [name], MD  FLUoxetine (PROZAC) 40 MG capsule Take 1  capsule (40 mg total) by mouth daily. 11/15/18 11/15/19  Wonda Olds, MD  ibuprofen (ADVIL) 800 MG tablet Take 800 mg by mouth as needed.    [provider]  insulin aspart (NOVOLOG) 100 UNIT/ML injection 35 units per insulin pump daily 12/17/18   Freeman Caldron M, PA-C  ipratropium (ATROVENT) 0.06 % nasal spray Place 2 sprays into both nostrils 2 (two) times daily as needed for rhinitis.    [provider]  isosorbide mononitrate (IMDUR) 30 MG 24 hr tablet Take 0.5 tablets (15 mg total) by mouth daily. 01/08/19   [name], MD  levothyroxine (EUTHYROX) 100 MCG tablet Take 1 tablet (100 mcg total) by mouth daily before breakfast. Must have office visit for refills 12/17/18  Freeman Caldron M, PA-C  metoCLOPramide (REGLAN) 5 MG tablet Take 1 tablet (5 mg total) by mouth every 12 (twelve) hours as needed for nausea or vomiting. 12/17/18 12/17/19  Argentina Donovan, PA-C  montelukast (SINGULAIR) 10 MG tablet Take 10 mg by mouth at bedtime.    [provider]  nitroGLYCERIN (NITROSTAT) 0.4 MG SL tablet Place 1 tablet (0.4 mg total) under the tongue every 5 (five) minutes as needed for chest pain. 01/08/19   [name], MD  ondansetron (ZOFRAN ODT) 4 MG disintegrating tablet Take 1 tablet (4 mg total) by mouth every 8 (eight) hours as needed for nausea or vomiting. 12/17/18   Argentina Donovan, PA-C  pantoprazole (PROTONIX) 40 MG tablet Take 40 mg by mouth 2 (two) times daily as needed.    [provider]  Simethicone (PHAZYME PO) Take 1 tablet by mouth as needed (upset stomach).    [provider]  sucralfate (CARAFATE) 1 GM/10ML suspension Take 1 g by mouth as needed.    [provider]    Allergies    Atorvastatin, Crestor [rosuvastatin calcium], Peanut-containing drug products, Zetia [ezetimibe], Ciprofloxin hcl [ciprofloxacin], and Food color red [red dye]  Review of Systems   Review of Systems  Constitutional: Negative for  appetite change, diaphoresis and fever.  HENT: Negative for congestion and sore throat.   Respiratory: Negative for cough and shortness of breath.   Cardiovascular: Negative for chest pain.  Gastrointestinal: Positive for abdominal pain, nausea and vomiting. Negative for diarrhea.  Endocrine: Negative for polyuria.  Genitourinary: Negative for dysuria.  Musculoskeletal: Negative for back pain.  Skin: Negative for rash.  Neurological: Negative for dizziness, light-headedness and headaches.  All other systems reviewed and are negative.   Physical Exam Updated Vital Signs BP (!) 169/77   Pulse (!) 116   Temp 98.5 F (36.9 C) (Oral)   Resp 20   Ht 5\' 5"  (1.651 m)   Wt 68 kg   LMP 02/12/2019   SpO2 100%   BMI 24.96 kg/m   Physical Exam Vitals and nursing note reviewed.  Constitutional:      Appearance: Normal appearance. She is not toxic-appearing or diaphoretic.     Comments: Patient is writhing in bed and holding her stomach and appears uncomfortable and in pain  HENT:     Head: Normocephalic.     Nose: No rhinorrhea.     Mouth/Throat:     Mouth: Mucous membranes are moist.  Eyes:     Conjunctiva/sclera: Conjunctivae normal.  Cardiovascular:     Rate and Rhythm: Regular rhythm. Tachycardia present.  Pulmonary:     Effort: Pulmonary effort is normal.  Abdominal:     General: Abdomen is flat.     Comments: Decreased bowel sounds with diffuse tenderness throughout.  No guarding or rebound.  Skin:    General: Skin is warm and dry.     Capillary Refill: Capillary refill takes less than 2 seconds.  Neurological:     General: No focal deficit present.     Mental Status: She is alert.  Psychiatric:        Mood and Affect: Mood normal.     ED Results / Procedures / Treatments   Labs (all labs ordered are listed, but only abnormal results are displayed) Labs Reviewed  BASIC METABOLIC PANEL - Abnormal; Notable for the following components:      Result Value   Sodium  134 (*)    Potassium 3.3 (*)  CO2 17 (*)    Glucose, Bld 346 (*)    Creatinine, Ser 1.23 (*)    GFR calc non Af Amer 51 (*)    GFR calc Af Amer 59 (*)    Anion gap 17 (*)    All other components within normal limits  CBC - Abnormal; Notable for the following components:   WBC 14.6 (*)    RDW 15.7 (*)    Platelets 510 (*)    All other components within normal limits  CBG MONITORING, ED - Abnormal; Notable for the following components:   Glucose-Capillary 329 (*)    All other components within normal limits  I-STAT CHEM 8, ED - Abnormal; Notable for the following components:   Potassium 3.4 (*)    BUN 21 (*)    Glucose, Bld 331 (*)    TCO2 18 (*)    All other components within normal limits  POCT I-STAT EG7 - Abnormal; Notable for the following components:   pH, Ven 7.433 (*)    pCO2, Ven 25.7 (*)    Bicarbonate 17.2 (*)    TCO2 18 (*)    Acid-base deficit 5.0 (*)    Potassium 3.4 (*)    All other components within normal limits  CBG MONITORING, ED - Abnormal; Notable for the following components:   Glucose-Capillary 338 (*)    All other components within normal limits  CBG MONITORING, ED - Abnormal; Notable for the following components:   Glucose-Capillary 278 (*)    All other components within normal limits  SARS CORONAVIRUS 2 (TAT 6-24 HRS)  BETA-HYDROXYBUTYRIC ACID  URINALYSIS, ROUTINE W REFLEX MICROSCOPIC  BLOOD GAS, VENOUS  I-STAT BETA HCG BLOOD, ED ([facility], [facility], [facility] ONLY)  CBG MONITORING, ED    EKG EKG Interpretation  Date/Time:  Saturday March 15 2019 09:39:16 EST Ventricular Rate:  107 PR Interval:    QRS Duration: 77 QT Interval:  329 QTC Calculation: 439 R Axis:   99 Text Interpretation: Sinus tachycardia Atrial premature complex Probable left atrial enlargement Borderline right axis deviation Borderline T abnormalities, inferior leads Baseline wander in lead(s) V2 Confirmed by Quintella Reichert [phone]) on 03/15/2019 9:44:10 AM   Radiology No results  found.  Procedures Procedures (including critical care time)  Medications Ordered in ED Medications  sodium chloride 0.9 % bolus 1,000 mL (1,000 mLs Intravenous New Bag/Given 03/15/19 1247)  insulin regular, human (MYXREDLIN) 100 units/ 100 mL infusion (8 Units/hr Intravenous Rate/Dose Change 03/15/19 1254)  0.9 %  sodium chloride infusion (has no administration in time range)  dextrose 5 %-0.45 % sodium chloride infusion (has no administration in time range)  dextrose 50 % solution 0-50 mL (has no administration in time range)  potassium chloride 10 mEq in 100 mL IVPB (10 mEq Intravenous New Bag/Given 03/15/19 1250)  sodium chloride 0.9 % bolus 1,000 mL (0 mLs Intravenous Stopped 03/15/19 1120)  ondansetron (ZOFRAN) injection 4 mg (4 mg Intravenous Given 03/15/19 1001)  droperidol (INAPSINE) 2.5 MG/ML injection 1.25 mg (1.25 mg Intravenous Given 03/15/19 1039)  morphine 4 MG/ML injection 4 mg (4 mg Intravenous Given 03/15/19 1139)    ED Course  I have reviewed the triage vital signs and the nursing notes.  Pertinent labs & imaging results that were available during my care of the patient were reviewed by me and considered in my medical decision making (see chart for details).  Clinical Course as of Mar 14 1316  Sat Mar 15, 2019  1047 Type I diabetic presenting with  abdominal pain and nausea vomiting since last Thursday.  Appears to be in DKA based on labs with decreased CO2, bicarb and an anion gap of 17.  Patient also has history of gastroparesis which is contributing.  Patient tachycardic to 130 on initial presentation.  Afebrile.  Continues to be actively vomiting and in pain after droperidol and zofran.  Fluids, insulin drip and DKA order set activated. Will consult for admission   [KM]    Clinical Course User Index [KM] Kristine Royal   MDM Rules/Calculators/A&P                      CRITICAL CARE Performed by: Alveria Apley   Total critical care time: 35  minutes  Critical care time was exclusive of separately billable procedures and treating other patients.  Critical care was necessary to treat or prevent imminent or life-threatening deterioration.  Critical care was time spent personally by me on the following activities: development of treatment plan with patient and/or surrogate as well as nursing, discussions with consultants, evaluation of patient's response to treatment, examination of patient, obtaining history from patient or surrogate, ordering and performing treatments and interventions, ordering and review of laboratory studies, ordering and review of radiographic studies, pulse oximetry and re-evaluation of patient's condition.  Final Clinical Impression(s) / ED Diagnoses Final diagnoses:  Diabetic ketoacidosis without coma associated with type 1 diabetes mellitus ([place])  AKI (acute kidney injury) [facility])    Rx / DC Orders ED Discharge Orders    None       Kristine Royal 03/15/19 1318    Quintella Reichert, MD 03/17/19 [phone]

## 2019-03-15 NOTE — Plan of Care (Signed)
POC initiated and progressing. 

## 2019-03-15 NOTE — Progress Notes (Signed)
Endo tool, insulin gtt and D5 stopped at 2051 two hours after the Lantus was given.  Patient switched to ACHS starting at 2200. We'll continue to monitor.

## 2019-03-15 NOTE — ED Triage Notes (Signed)
Pt. Stated, Im in ketoacidosis, Ive been vomiting for 2 days and taking Zofran and is not stopping it. My last sugar reading was 1 hour ago and it read 565 or higher.  Pt. Having active vomiting in triage.

## 2019-03-16 DIAGNOSIS — R112 Nausea with vomiting, unspecified: Secondary | ICD-10-CM

## 2019-03-16 LAB — CBC
HCT: 33.3 % — ABNORMAL LOW (ref 36.0–46.0)
HCT: 30.9 % — ABNORMAL LOW (ref 36.0–46.0)
Hemoglobin: 10.6 g/dL — ABNORMAL LOW (ref 12.0–15.0)
Hemoglobin: 10 g/dL — ABNORMAL LOW (ref 12.0–15.0)
MCH: 28 pg (ref 26.0–34.0)
MCH: 28.1 pg (ref 26.0–34.0)
MCHC: 31.8 g/dL (ref 30.0–36.0)
MCHC: 32.4 g/dL (ref 30.0–36.0)
MCV: 87.9 fL (ref 80.0–100.0)
MCV: 86.8 fL (ref 80.0–100.0)
Platelets: 443 10*3/uL — ABNORMAL HIGH (ref 150–400)
Platelets: 440 10*3/uL — ABNORMAL HIGH (ref 150–400)
RBC: 3.79 MIL/uL — ABNORMAL LOW (ref 3.87–5.11)
RBC: 3.56 MIL/uL — ABNORMAL LOW (ref 3.87–5.11)
RDW: 16 % — ABNORMAL HIGH (ref 11.5–15.5)
RDW: 15.6 % — ABNORMAL HIGH (ref 11.5–15.5)
WBC: 13 10*3/uL — ABNORMAL HIGH (ref 4.0–10.5)
WBC: 8.4 10*3/uL (ref 4.0–10.5)
nRBC: 0 % (ref 0.0–0.2)
nRBC: 0 % (ref 0.0–0.2)

## 2019-03-16 LAB — GLUCOSE, CAPILLARY
Glucose-Capillary: 249 mg/dL — ABNORMAL HIGH (ref 70–99)
Glucose-Capillary: 88 mg/dL (ref 70–99)

## 2019-03-16 LAB — BASIC METABOLIC PANEL
Anion gap: 12 (ref 5–15)
BUN: 16 mg/dL (ref 6–20)
CO2: 17 mmol/L — ABNORMAL LOW (ref 22–32)
Calcium: 8.5 mg/dL — ABNORMAL LOW (ref 8.9–10.3)
Chloride: 103 mmol/L (ref 98–111)
Creatinine, Ser: 0.97 mg/dL (ref 0.44–1.00)
GFR calc Af Amer: >60 mL/min (ref >60)
GFR calc non Af Amer: >60 mL/min (ref >60)
Glucose, Bld: 341 mg/dL — ABNORMAL HIGH (ref 70–99)
Potassium: 5 mmol/L (ref 3.5–5.1)
Sodium: 132 mmol/L — ABNORMAL LOW (ref 135–145)

## 2019-03-16 MED ORDER — INSULIN GLARGINE 100 UNIT/ML ~~LOC~~ SOLN
8.0000 [IU] | Freq: Every day | SUBCUTANEOUS | Status: DC
  Administered 2019-03-16: 8 [IU] via SUBCUTANEOUS
  Filled 2019-03-16: qty 0.08

## 2019-03-16 MED ORDER — INSULIN GLARGINE 100 UNIT/ML ~~LOC~~ SOLN
20.0000 [IU] | Freq: Every day | SUBCUTANEOUS | Status: DC
  Filled 2019-03-16: qty 0.2

## 2019-03-16 MED ORDER — INSULIN ASPART 100 UNIT/ML ~~LOC~~ SOLN
3.0000 [IU] | Freq: Three times a day (TID) | SUBCUTANEOUS | Status: DC
  Administered 2019-03-16: 3 [IU] via SUBCUTANEOUS

## 2019-03-16 MED ORDER — INSULIN ASPART 100 UNIT/ML ~~LOC~~ SOLN
5.0000 [IU] | Freq: Once | SUBCUTANEOUS | Status: DC
  Administered 2019-03-16: 07:00:00 5 [IU] via SUBCUTANEOUS

## 2019-03-16 NOTE — Discharge Summary (Addendum)
Name: Jennifer Hogan MRN: [id number] DOB: November 29, 1970 49 y.o. PCP: Jennifer Pounds, NP  Date of Admission: 03/15/2019  9:16 AM Date of Discharge: 2/21/20212/21/21 Attending Physician: Lenice Pressman, MD, PhD   Discharge Diagnosis: 1. Symptomatic hyperglycemia in type 1 diabetic on insulin pump 2. Nausea/vomiting  Discharge Medications: Allergies as of 03/16/2019       Reactions   Atorvastatin    Myalgias on 80mg  dose, tolerates 20mg  ok   Crestor [rosuvastatin Calcium] Other (See Comments)   Severe myalgias and joint pain. Has tolerated atorvastatin though   Peanut-containing Drug Products Other (See Comments)   Vomiting, upset stomach and some wheezing   Zetia [ezetimibe]    myalgias   Ciprofloxin Hcl [ciprofloxacin] Nausea And Vomiting, Other (See Comments)   Severe migraine   Food Color Red [red Dye] Diarrhea        Medication List     TAKE these medications    albuterol 108 (90 Base) MCG/ACT inhaler Commonly known as: VENTOLIN HFA Inhale 2 puffs into the lungs every 6 (six) hours as needed for wheezing or shortness of breath.   aspirin 81 MG EC tablet Take 1 tablet (81 mg total) by mouth daily.   atorvastatin 20 MG tablet Commonly known as: LIPITOR Take 1 tablet (20 mg total) by mouth daily.   clopidogrel 75 MG tablet Commonly known as: PLAVIX Take 1 tablet (75 mg total) by mouth daily.   FLUoxetine 40 MG capsule Commonly known as: PROzac Take 1 capsule (40 mg total) by mouth daily.   insulin aspart 100 UNIT/ML injection Commonly known as: novoLOG 35 units per insulin pump daily What changed:  how much to take how to take this when to take this additional instructions   ipratropium 0.06 % nasal spray Commonly known as: ATROVENT Place 2 sprays into both nostrils 2 (two) times daily as needed for rhinitis.   isosorbide mononitrate 30 MG 24 hr tablet Commonly known as: IMDUR Take 0.5 tablets (15 mg total) by mouth daily.   levothyroxine  100 MCG tablet Commonly known as: Euthyrox Take 1 tablet (100 mcg total) by mouth daily before breakfast. Must have office visit for refills   metoCLOPramide 5 MG tablet Commonly known as: Reglan Take 1 tablet (5 mg total) by mouth every 12 (twelve) hours as needed for nausea or vomiting.   montelukast 10 MG tablet Commonly known as: SINGULAIR Take 10 mg by mouth at bedtime.   nitroGLYCERIN 0.4 MG SL tablet Commonly known as: NITROSTAT Place 1 tablet (0.4 mg total) under the tongue every 5 (five) minutes as needed for chest pain.   ondansetron 4 MG disintegrating tablet Commonly known as: Zofran ODT Take 1 tablet (4 mg total) by mouth every 8 (eight) hours as needed for nausea or vomiting.   pantoprazole 40 MG tablet Commonly known as: PROTONIX Take 40 mg by mouth 2 (two) times daily as needed (acid reflux).   PHAZYME PO Take 1 tablet by mouth as needed (upset stomach).   sucralfate 1 GM/10ML suspension Commonly known as: CARAFATE Take 1 g by mouth as needed.        Disposition and follow-up:   Jennifer Hogan was discharged from [facility] in Good condition.  At the hospital follow up visit please address:  1.  Symptomatic hyperglycemia in type 1 diabetic: patient told to restart basal dose of insulin pump day after discharge 2/23  Nausea/vomiting: follow up with gastroenterology for further workup  2.  Labs /  imaging needed at time of follow-up: blood glucose levels  3.  Pending labs/ test needing follow-up: none  Follow-up Appointments: Follow-up Information     Jennifer Pounds, NP. Go in 1 week(s).   Specialty: Nurse Practitioner Contact information: [place] [address] [phone]         [name], MD .   Specialty: Cardiology Contact information: [id number] [address] [place] Hospital Course by problem list: 1. Symptomatic hyperglycemia in type  1 diabetic:  Patient presented with glucose readings in 500s at home. On admission, patient had primary respiratory alkalosis with secondary metabolic acidosis (ph [id number], pco2 25.7, bicarb 17.2). Patient was started on iv insulin drip, given iv fluids, and frequent electrolyte checks were done. Once the patient's blood glucose came down below 250, subcutaneous insulin was started and the patient was given a diet.   The patient's hyperglycemia was thought to be secondary to stress reaction in setting of acute illness with nausea and vomiting.   2. Nausea and vomiting Patient presented with two days of uncontrollable nausea,vomtiing, and inability to take oral intake. She was afebrile. Symptoms resolved with supportive care- metoclopromide, phenergan, pantoprazole, sucralfate, zofran, dicyclomine, maalox. Symptoms resolved one day after admission.   Patient's symptoms are thought to be secondary to food intolerance, progressive gastroparesis, versus cyclic vomiting syndrome. Patient is to have outpatient GI follow up for further evaluation.   Discharge Vitals:   BP (!) 91/51 (BP Location: Right Arm)   Pulse 78   Temp 98.3 F (36.8 C) (Oral)   Resp 17   Ht 5\' 5"  (1.651 m)   Wt 63.8 kg   LMP 02/12/2019   SpO2 99%   BMI 23.40 kg/m   Pertinent Labs, Studies, and Procedures:   BMP Latest Ref Rng & Units 03/16/2019 03/15/2019 03/15/2019  Glucose 70 - 99 mg/dL 341(H) 330(H) 168(H)  BUN 6 - 20 mg/dL 16 15 14   Creatinine 0.44 - 1.00 mg/dL 0.97 0.91 0.83  BUN/Creat Ratio 9 - 23 - - -  Sodium 135 - 145 mmol/L 132(L) 132(L) 136  Potassium 3.5 - 5.1 mmol/L 5.0 4.8 4.1  Chloride 98 - 111 mmol/L 103 103 106  CO2 22 - 32 mmol/L 17(L) 17(L) 17(L)  Calcium 8.9 - 10.3 mg/dL 8.5(L) 8.6(L) 8.8(L)   CBC Latest Ref Rng & Units 03/16/2019 03/16/2019 03/15/2019  WBC 4.0 - 10.5 K/uL 8.4 13.0(H) -  Hemoglobin 12.0 - 15.0 g/dL 10.0(L) 10.6(L) 14.6  Hematocrit 36.0 - 46.0 % 30.9(L) 33.3(L) 43.0  Platelets 150 -  400 K/uL 440(H) 443(H) -     Discharge Instructions: Discharge Instructions     Call MD for:  persistant dizziness or light-headedness   Complete by: As directed    Call MD for:  persistant nausea and vomiting   Complete by: As directed    Call MD for:  redness, tenderness, or signs of infection (pain, swelling, redness, odor or green/yellow discharge around incision site)   Complete by: As directed    Diet - low sodium heart healthy   Complete by: As directed    Diet - low sodium heart healthy   Complete by: As directed    Discharge instructions   Complete by: As directed    It was a pleasure taking care of you Ms. Mates. During your hospitalization you were taken care of for symptomatic hyperglycemia.   Discharge instructions  Complete by: As directed    It was a pleasure to take care of you Ms. Liwanag. During your hospitalization you were taken care of for symptomatic hyperglycemia, nausea, and vomiting. Your blood glucose levels were normalized prior to discharge and you were able to tolerate oral intake prior to leaving. We would like for you to follow up with your gastroenterologist to determine the cause of the nausea, vomiting, and abdominal pain. It is possibly due to worsening of gastroparesis vs cyclic vomiting syndrome.   Increase activity slowly   Complete by: As directed    Increase activity slowly   Complete by: As directed        Signed: Lars Mage, MD 03/17/2019, 1:57 PM   Pager: Pager: [phone]

## 2019-03-16 NOTE — Progress Notes (Signed)
   Subjective:  Jennifer Hogan was seen at bedside this AM. She states that she is doing better than yesterday. She did endorse that she vomited her liquid potassium, but was fine after. She states that she does not know what is going on, as this is different then other episodes. She postulates that it could be a combination of her gastroparesis and a possible allergy. She states that she is allergic to some foods like red dye and MSG. States that she feels embarrassed to come into hospital and being unable to manage her symptoms at home.   Objective:  Vital signs in last 24 hours: Vitals:   03/15/19 1546 03/15/19 2016 03/15/19 2343 03/16/19 0521  BP: (!) 103/54 101/84 125/68 124/72  Pulse: (!) 106 (!) 101 96 94  Resp: 20 (!) 22 18 19   Temp: 98.6 F (37 C) 98.3 F (36.8 C) 98.3 F (36.8 C) 99.1 F (37.3 C)  TempSrc: Oral Oral Oral Axillary  SpO2: 99% 98% 98% 100%  Weight: 68 kg   63.8 kg  Height: 5\' 5"  (1.651 m)   5\' 5"  (1.651 m)   Physical Exam  Constitutional: She is oriented to person, place, and time. She appears well-developed and well-nourished. No distress.  Pleasant, in good mood  HENT:  Head: Normocephalic and atraumatic.  Eyes: Conjunctivae are normal.  Cardiovascular: Normal rate, regular rhythm and normal heart sounds.  Respiratory: Effort normal and breath sounds normal. No respiratory distress. She has no wheezes.  GI: Soft. Bowel sounds are normal. She exhibits no distension. There is no abdominal tenderness.  Neurological: She is alert and oriented to person, place, and time.  Skin: She is not diaphoretic.  Psychiatric: She has a normal mood and affect. Her behavior is normal. Judgment and thought content normal.   Assessment/Plan:  Active Problems:   Severe hyperglycemia due to diabetes mellitus (Start)  Jennifer Hogan is a 49 y.o f with diabetes mellitus type1 c/b gastroparesis, cad s/p cabg, hypothyroidism who presented with 2 day history of nausea, vomiting.     Symptomatic hyperglycemia in type 1 diabetic on insulin pump  Per morning labs the patient's anion gap has closed, she was able to tolerate po intake, and be switched to sq insulin (lantus 8pm+8am, novolog 3u tidwc, ssi). Her blood glucose has been in the 300s this morning.   Patient's symptomatic hyperglycemia likely developed in the setting of acute illness.    -resume basal dose on insulin pump starting 2/22. Gave patient's instructions at bedside  -outpatient endocrinology follow up   Nausea/Vomiting The patient has not had any additional nausea/vomiting this morning. Her electrolytes are stable, aki resolved.   Thought to be secondary to food allergy, gastroparesis flare, vs cyclic vomiting syndrome.Thyroid levels within normal limits. Patient already has appointment with gastroenterology scheduled for Friday 03/22/19.   -follow up with GI outpatient  -metoclopromide 5mg  bid prn  -continue phenergan prn  -continue pantoprazole 40mg  qd  -continue sucralfate 1g prn   CAD -continue aspirin 81mg  qd, plavix 75mg  qd, imdur 15mg  qd, atorvastatin 20mg  qd   Hypothyroidism  Stable tsh in December 2020.   -continue levothyroxine 173mcg qd  Dispo: Anticipated discharge today.  Lars Mage, MD 03/16/2019, 6:54 AM

## 2019-03-16 NOTE — Discharge Instructions (Signed)
Nausea and Vomiting, Adult Nausea is feeling sick to your stomach or feeling that you are about to throw up (vomit). Vomiting is when food in your stomach is thrown up and out of the mouth. Throwing up can make you feel weak. It can also make you lose too much water in your body (get dehydrated). If you lose too much water in your body, you may:  Feel tired.  Feel thirsty.  Have a dry mouth.  Have cracked lips.  Go pee (urinate) less often. Older adults and people with other diseases or a weak body defense system (immune system) are at higher risk for losing too much water in the body. If you feel sick to your stomach and you throw up, it is important to follow instructions from your doctor about how to take care of yourself. Follow these instructions at home: Watch your symptoms for any changes. Tell your doctor about them. Follow these instructions to care for yourself at home. Eating and drinking      Take an ORS (oral rehydration solution). This is a drink that is sold at pharmacies and stores.  Drink clear fluids in small amounts as you are able, such as: ? Water. ? Ice chips. ? Fruit juice that has water added (diluted fruit juice). ? Low-calorie sports drinks.  Eat bland, easy-to-digest foods in small amounts as you are able, such as: ? Bananas. ? Applesauce. ? Rice. ? Low-fat (lean) meats. ? Toast. ? Crackers.  Avoid drinking fluids that have a lot of sugar or caffeine in them. This includes energy drinks, sports drinks, and soda.  Avoid alcohol.  Avoid spicy or fatty foods. General instructions  Take over-the-counter and prescription medicines only as told by your doctor.  Drink enough fluid to keep your pee (urine) pale yellow.  Wash your hands often with soap and water. If you cannot use soap and water, use hand sanitizer.  Make sure that all people in your home wash their hands well and often.  Rest at home while you get better.  Watch your condition  for any changes.  Take slow and deep breaths when you feel sick to your stomach.  Keep all follow-up visits as told by your doctor. This is important. Contact a doctor if:  Your symptoms get worse.  You have new symptoms.  You have a fever.  You cannot drink fluids without throwing up.  You feel sick to your stomach for more than 2 days.  You feel light-headed or dizzy.  You have a headache.  You have muscle cramps.  You have a rash.  You have pain while peeing. Get help right away if:  You have pain in your chest, neck, arm, or jaw.  You feel very weak or you pass out (faint).  You throw up again and again.  You have throw up that is bright red or looks like black coffee grounds.  You have bloody or black poop (stools) or poop that looks like tar.  You have a very bad headache, a stiff neck, or both.  You have very bad pain, cramping, or bloating in your belly (abdomen).  You have trouble breathing.  You are breathing very quickly.  Your heart is beating very quickly.  Your skin feels cold and clammy.  You feel confused.  You have signs of losing too much water in your body, such as: ? Dark pee, very little pee, or no pee. ? Cracked lips. ? Dry mouth. ? Sunken eyes. ?  Sleepiness. ? Weakness. These symptoms may be an emergency. Do not wait to see if the symptoms will go away. Get medical help right away. Call your local emergency services (911 in the [place] Do not drive yourself to the hospital. Summary  Nausea is feeling sick to your stomach or feeling that you are about to throw up (vomit). Vomiting is when food in your stomach is thrown up and out of the mouth.  Follow instructions from your doctor about eating and drinking to keep from losing too much water in your body.  Take over-the-counter and prescription medicines only as told by your doctor.  Contact your doctor if your symptoms get worse or you have new symptoms.  Keep all follow-up  visits as told by your doctor. This is important. This information is not intended to replace advice given to you by your health care provider. Make sure you discuss any questions you have with your health care provider. Document Revised: 05/03/2018 Document Reviewed: 06/19/2017 Elsevier Patient Education  [employer].   Cyclic Vomiting Syndrome, Adult Cyclic vomiting syndrome (CVS) is a condition that causes episodes of severe nausea and vomiting. It can last for hours or even days. Attacks may occur several times a month or several times a year. Between episodes of CVS, you may be otherwise healthy. What are the causes? The cause of this condition is not known. Although many of the episodes can happen for no obvious reason, you may have specific CVS triggers. Episodes may be triggered by:  An infection, especially colds and the flu.  Emotional stress, including excitement or anxiety about upcoming events, such as school, parties, or travel.  Certain foods or beverages, such as chocolate, cheese, alcohol, and food additives.  Motion sickness.  Eating a large meal before bed.  Being very tired.  Being too hot. What increases the risk? You are more likely to develop this condition if:  You get migraine headaches.  You have a family history of CVS or migraine headaches. What are the signs or symptoms? Symptoms tend to happen at the same time of day, and each episode tends to last about the same amount of time. Symptoms commonly start at night or when you wake up. Many people have warning signs (prodrome) before an episode, which may include slight nausea, sweating, and pale skin (pallor). The most common symptoms of a CVS attack include:  Severe vomiting. Vomiting may happen every 5-15 minutes.  Severe nausea.  Gagging (retching). Other symptoms may include:  Headache.  Dizziness.  Sensitivity to light.  Extreme thirst.  Abdominal pain. This can be  severe.  Loose stools or diarrhea.  Fever.  Pale skin (pallor), especially on the face.  Weakness.  Exhaustion.  Sleepiness after a CVS episode.  Dehydration. This can cause: ? Thirst. ? Dry mouth. ? Decreased urination. ? Fatigue. How is this diagnosed? This condition may be diagnosed based on your symptoms, medical history, and family history of CVS or migraine. Your health care provider will ask whether you have had:  Episodes of severe nausea and vomiting that have happened a total of 5 or more times, or 3 or more times in the past 6 months.  Episodes that last for 1 hour or more, and occur 1 week apart or farther apart.  Episodes that are similar each time.  Normal health between episodes. Your health care provider will also do a physical exam. To rule out other conditions, you may have tests, such as:  Blood  tests.  Urine tests.  Imaging tests. How is this treated? There is no cure for this condition, but treatment can help manage or prevent CVS episodes. Work with your health care provider to find the best treatment for you. Treatment may include:  Avoiding stress and CVS triggers.  Eating smaller, more frequent meals.  Taking medicines, such as: ? Over-the-counter pain medicine. ? Anti-nausea medicines. ? Antacids. ? Antihistamines. ? Medicines for migraines. ? Antidepressants. ? Antibiotics. Severe nausea and vomiting may require you to stay at the hospital. You may need IV fluids to prevent or treat dehydration. Follow these instructions at home: During an episode  Take over-the-counter and prescription medicines only as told by your health care provider.  Stay in bed and rest in a dark, quiet room. After an episode   Drink an oral rehydration solution (ORS), if directed by your health care provider. This is a drink that helps you replace fluids and the salts and minerals in your blood (electrolytes). It can be found at pharmacies and retail  stores.  Drink small amounts of clear fluids slowly and gradually add more. ? Drink clear fluids such as water or fruit juice that has water added (is diluted). You may also eat low-calorie popsicles. ? Avoid drinking fluids that contain a lot of sugar or caffeine, such as sports drinks and soda.  Eat soft foods in small amounts every 3-4 hours. Eat your regular diet, but avoid spicy or fatty foods, such as french fries and pizza. General instructions  Monitor your condition for any changes.  If you were prescribed an antibiotic medicine, take it as told by your health care provider. Do not stop taking the antibiotic even if you start to feel better.  Keep track of your attacks and symptoms, and pay attention to any triggers. Avoid those triggers when you can.  Keep all follow-up visits as told by your health care provider. This is important. Contact a health care provider if:  Your condition gets worse.  You cannot drink fluids without vomiting.  You have pain and trouble swallowing after an episode. Get help right away if:  You have blood in your vomit.  Your vomit looks like coffee grounds.  You have stools that are bloody or black, or stools that look like tar.  You have signs of dehydration, such as: ? Sunken eyes. ? Not making tears while crying. ? Very dry mouth. ? Cracked lips. ? Decreased urine production. ? Dark urine. Urine may be the color of tea. ? Weakness. ? Sleepiness. Summary  Cyclic vomiting syndrome (CVS) causes episodes of severe nausea and vomiting that can last for hours or even days.  Vomiting and diarrhea can make you feel weak and can lead to dehydration. If you notice signs of dehydration, call your health care provider right away.  Treatment can help you manage or prevent CVS episodes. Work with your health care provider to find the best treatment for you.  Keep all follow-up visits as told by your health care provider. This is  important. This information is not intended to replace advice given to you by your health care provider. Make sure you discuss any questions you have with your health care provider. Document Revised: 10/15/2018 Document Reviewed: 02/25/2016 Elsevier Patient Education  2020 [employer].

## 2019-03-17 ENCOUNTER — Telehealth: Payer: Self-pay

## 2019-03-17 NOTE — Telephone Encounter (Signed)
Transition Care Management Follow-up Telephone Call  Date of discharge and from where: 02/21/2021from [facility]  How have you been since you were released from the hospital? Little weak but better  Any questions or concerns? None  Items Reviewed:  Did the pt receive and understand the discharge instructions provided?   YES  Medications obtained and verified? YES  Any new allergies since your discharge? NONE  Dietary orders reviewed? YES  Do you have support at home? Neighbors and friends  Functional Questionnaire: (I = Independent and D = Dependent) ADLs: I  Bathing/Dressing- I  Meal Prep- I  Eating-I   Maintaining continence- I  Transferring/Ambulation- I  Managing Meds- I  Follow up appointments reviewed:   PCP Hospital f/u appt confirmed?  Scheduled to see Karlyne Greenspan on 03/19/2019 @ 10:50am  Specialist Hospital f/u appt confirmed?  YES [facility]  Are transportation arrangements needed? NO  If their condition worsens, is the pt aware to call PCP or go to the Emergency Dept.? YES  Was the patient provided with contact information for the PCP's office or ED? YES Was to pt encouraged to call back with questions or concerns? YES   OTHER (DME)  NONE  Checked BS fingerstick  In the morning reading of 175 was obtained as per pt statement/  Educated pt regarding the importance of strictly adherence of MD instructions about diet, exercise and medications. Verbalized understanding

## 2019-03-17 NOTE — Telephone Encounter (Signed)
Called pt, unable to reach. Left voice message to call back. Name and Phone number provided.

## 2019-03-19 ENCOUNTER — Encounter: Payer: Self-pay | Admitting: Nurse Practitioner

## 2019-03-19 ENCOUNTER — Other Ambulatory Visit: Payer: Self-pay

## 2019-03-19 ENCOUNTER — Ambulatory Visit: Payer: Self-pay | Attending: Nurse Practitioner | Admitting: Nurse Practitioner

## 2019-03-19 DIAGNOSIS — F329 Major depressive disorder, single episode, unspecified: Secondary | ICD-10-CM

## 2019-03-19 DIAGNOSIS — E1059 Type 1 diabetes mellitus with other circulatory complications: Secondary | ICD-10-CM

## 2019-03-19 DIAGNOSIS — F32A Depression, unspecified: Secondary | ICD-10-CM

## 2019-03-19 DIAGNOSIS — F419 Anxiety disorder, unspecified: Secondary | ICD-10-CM

## 2019-03-19 DIAGNOSIS — J9801 Acute bronchospasm: Secondary | ICD-10-CM

## 2019-03-19 DIAGNOSIS — M2550 Pain in unspecified joint: Secondary | ICD-10-CM

## 2019-03-19 MED ORDER — DICLOFENAC SODIUM 1 % EX GEL: 2.0000 g | Freq: Four times a day (QID) | CUTANEOUS | 3 refills | Status: DC

## 2019-03-19 MED ORDER — ALBUTEROL SULFATE HFA 108 (90 BASE) MCG/ACT IN AERS: 2.0000 | INHALATION_SPRAY | Freq: Four times a day (QID) | RESPIRATORY_TRACT | 1 refills | Status: DC | PRN

## 2019-03-19 MED ORDER — HYDROXYZINE HCL 25 MG PO TABS: 25.0000 mg | ORAL_TABLET | Freq: Three times a day (TID) | ORAL | 1 refills | Status: DC | PRN

## 2019-03-19 MED ORDER — MONTELUKAST SODIUM 10 MG PO TABS: 10.0000 mg | ORAL_TABLET | Freq: Every day | ORAL | 2 refills | Status: DC

## 2019-03-19 MED ORDER — QUETIAPINE FUMARATE 50 MG PO TABS: 50.0000 mg | ORAL_TABLET | Freq: Every day | ORAL | 0 refills | Status: DC

## 2019-03-19 NOTE — Progress Notes (Signed)
Virtual Visit via Telephone Note Due to national recommendations of social distancing due to [place] 19, telehealth visit is felt to be most appropriate for this patient at this time.  I discussed the limitations, risks, security and privacy concerns of performing an evaluation and management service by telephone and the availability of in person appointments. I also discussed with the patient that there may be a patient responsible charge related to this service. The patient expressed understanding and agreed to proceed.    I connected with Jennifer Hogan on 03/19/19  at  10:50 AM EST  EDT by telephone and verified that I am speaking with the correct person using two identifiers.   Consent I discussed the limitations, risks, security and privacy concerns of performing an evaluation and management service by telephone and the availability of in person appointments. I also discussed with the patient that there may be a patient responsible charge related to this service. The patient expressed understanding and agreed to proceed.   Location of Patient: Private Residence   Location of Provider: [place] and [place] participating in Telemedicine visit: [name] FNP-BC [place]    History of Present Illness: Telemedicine visit for: Anxiety and Depression I have not seen Jennifer Hogan in this office since 03-27-2018. She states she has been dealing with a lot of stressors. her Father passed away last year. Ufortunately she also suffered a NSTEMI last year which also required CABG x3. She has had several ED admissions for what was presumed gastroparesis however she states she was recently told she may have cyclic vomiting syndrome 2/2 stress and anxiety. She does state she has taken ativan and valium and her vomiting completely resolved. She does have a history of chronic marijuana use and recently stopped smoking.    She has an  appointment with GI however she is uninsured and concerned about the COPAY. Patient has been advised to apply for financial assistance and schedule to see our financial counselor.    Anxiety/Depression/Insomnia  Feels her symptoms have increased since she stopped smoking marijuana. She tried increasing her prozac to 80 mg with no improvement in her mood. Symptoms include: racing thoughtsdepressed mood, difficulty concentrating, impaired memory and insomnia.   She denies current suicidal and homicidal plan or intent.    She has tried GABA/Melatonin however I have instructed her to only take melatonin w/o GABA for sleep.  Patient and/or legal guardian verbally consented to [facility] services about presenting concerns and psychiatric consultation as appropriate.   Depression screen [facility] 2/9 03/19/2019 03/27/2018 07/23/2017 06/21/2017 04/30/2017  Decreased Interest 3 3 1 3 1   Down, Depressed, Hopeless 3 3 1 3  -  PHQ - 2 Score 6 6 2 6 1   Altered sleeping 3 2 1 3 1   Tired, decreased energy 2 2 1 3 1   Change in appetite 2 3 0 3 1  Feeling bad or failure about yourself  3 3 2 2 1   Trouble concentrating 3 2 1 2 1   Moving slowly or fidgety/restless 0 0 0 3 0  Suicidal thoughts 1 1 0 0 0  PHQ-9 Score 20 19 7 22 6    GAD 7 : Generalized Anxiety Score 03/19/2019 03/27/2018 07/23/2017 06/21/2017  Nervous, Anxious, on Edge 3 3 1 1   Control/stop worrying 3 3 1 3   Worry too much - different things 3 3 1 2   Trouble relaxing 2 3 1 2   Restless 2 0  0 0  Easily annoyed or irritable 3 1 1 3   Afraid - awful might happen 3 3 1  0  Total GAD 7 Score 19 16 6 11    Arthralgias Joint pain (mostly bilateral knees). Stopped smoking marijuana which seemed to ease her joint pain.  Wants to resume meloxicam as needed 15 mg however NSAIDs not recommended post CABG.     Past Medical History:  Diagnosis Date  . Anemia   . Anxiety   . Coronary artery disease    a. s/pPTCA DES x3 in the LAD (ostial DES  placed, mid DES placed, distal DES placed)and PTCA/DES x1to intermediate branch 02/2017. b. ACS 02/08/18 with LCx stent placement.  . Depression   . Diabetes mellitus    type 1, on insulin pump  . DKA (diabetic ketoacidoses) ([place]) 12/31/2017  . Elevated platelet count   . High cholesterol   . Hypothyroidism   . Ischemic cardiomyopathy    a. EF low-normal with basal inferior akinesis, basal septal hypokinesis by echo 01/2018.    Past Surgical History:  Procedure Laterality Date  . APPENDECTOMY    . CORONARY ARTERY BYPASS GRAFT N/A 11/04/2018   Procedure: CORONARY ARTERY BYPASS GRAFTING (CABG), ON PUMP, TIMES THREE, USING LEFT AND RIGHT INTERNAL MAMMARY ARTERIES;  Surgeon: Wonda Olds, MD;  Location: [facility];  Service: Open Heart Surgery;  Laterality: N/A;  . CORONARY STENT INTERVENTION N/A 03/20/2017   Procedure: DES x 3 LAD, DES OM; Surgeon: Burnell Blanks, MD;  Location: [place] CV LAB;  Service: Cardiovascular;  Laterality: N/A;  . CORONARY/GRAFT ACUTE MI REVASCULARIZATION N/A 02/08/2018   Procedure: Coronary/Graft Acute MI Revascularization;  Surgeon: Belva Crome, MD;  Location: [place] CV LAB;  Service: Cardiovascular;  Laterality: N/A;  . LEFT HEART CATH AND CORONARY ANGIOGRAPHY N/A 03/20/2017   Procedure: LEFT HEART CATH AND CORONARY ANGIOGRAPHY;  Surgeon: Burnell Blanks, MD;  Location: [place] CV LAB;  Service: Cardiovascular;  Laterality: N/A;  . LEFT HEART CATH AND CORONARY ANGIOGRAPHY N/A 02/08/2018   Procedure: LEFT HEART CATH AND CORONARY ANGIOGRAPHY;  Surgeon: Belva Crome, MD;  Location: [place] CV LAB;  Service: Cardiovascular;  Laterality: N/A;  . LEFT HEART CATH AND CORONARY ANGIOGRAPHY N/A 10/29/2018   Procedure: LEFT HEART CATH AND CORONARY ANGIOGRAPHY;  Surgeon: Burnell Blanks, MD;  Location: [place] CV LAB;  Service: Cardiovascular;  Laterality: N/A;  . TEE WITHOUT CARDIOVERSION N/A 11/04/2018   Procedure:  TRANSESOPHAGEAL ECHOCARDIOGRAM (TEE);  Surgeon: Wonda Olds, MD;  Location: [place];  Service: Open Heart Surgery;  Laterality: N/A;    Family History  Problem Relation Age of Onset  . Coronary artery disease Father   . Congestive Heart Failure Father   . Asthma Father   . Cancer Mother        T cell lymphoma    Social History   Socioeconomic History  . Marital status: Divorced    Spouse name: Not on file  . Number of children: Not on file  . Years of education: Not on file  . Highest education level: Not on file  Occupational History  . 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 Smoking status: Never Smoker  . Smokeless tobacco: Never Used  . Tobacco comment: Use marijuana  Substance and Sexual Activity  . Alcohol use: No    Alcohol/week: 0.0 standard drinks  . Drug use: Not Currently    Types: Marijuana    Comment:  last use last week, occaisonal marijuana use  . Sexual activity: Not Currently    Birth control/protection: None  Other Topics Concern  . Not on file  Social History Narrative  . Not on file   Social Determinants of Health   Financial Resource Strain:   . Difficulty of Paying Living Expenses: Not on file  Food Insecurity:   . Worried About Charity fundraiser in the Last Year: Not on file  . Ran Out of Food in the Last Year: Not on file  Transportation Needs:   . Lack of Transportation (Medical): Not on file  . Lack of Transportation (Non-Medical): Not on file  Physical Activity:   . Days of Exercise per Week: Not on file  . Minutes of Exercise per Session: Not on file  Stress:   . Feeling of Stress : Not on file  Social Connections:   . Frequency of Communication with Friends and Family: Not on file  . Frequency of Social Gatherings with Friends and Family: Not on file  . Attends Religious Services: Not on file  . Active Member of Clubs or Organizations: Not on file  . Attends Archivist Meetings: Not on file  .  Marital Status: Not on file     Observations/Objective: Awake, alert and oriented x 3   Review of Systems  Constitutional: Negative for fever, malaise/fatigue and weight loss.  HENT: Negative.  Negative for nosebleeds.   Eyes: Negative.  Negative for blurred vision, double vision and photophobia.  Respiratory: Negative.  Negative for cough and shortness of breath.   Cardiovascular: Negative.  Negative for chest pain, palpitations and leg swelling.  Gastrointestinal: Negative.  Negative for heartburn, nausea and vomiting.  Musculoskeletal: Positive for joint pain. Negative for myalgias.  Neurological: Negative.  Negative for dizziness, focal weakness, seizures and headaches.  Psychiatric/Behavioral: Positive for depression. Negative for suicidal ideas. The patient is nervous/anxious.     Assessment and Plan: Hadlie was seen today for hospitalization follow-up.  Diagnoses and all orders for this visit:  Anxiety and depression -     hydrOXYzine (ATARAX/VISTARIL) 25 MG tablet; Take 1 tablet (25 mg total) by mouth 3 (three) times daily as needed. -     QUEtiapine (SEROQUEL) 50 MG tablet; Take 1 tablet (50 mg total) by mouth at bedtime. May increase to 2 tablets or 100 mg at bedtime after 2 weeks if needed -     Ambulatory referral to [facility]  Arthralgia of multiple joints -     diclofenac Sodium (VOLTAREN) 1 % GEL; Apply 2 g topically 4 (four) times daily.  Type 1 diabetes mellitus with other circulatory complication (HCC) -     Ambulatory referral to Ophthalmology  Bronchospasm -     montelukast (SINGULAIR) 10 MG tablet; Take 1 tablet (10 mg total) by mouth at bedtime. -     albuterol (VENTOLIN HFA) 108 (90 Base) MCG/ACT inhaler; Inhale 2 puffs into the lungs every 6 (six) hours as needed for wheezing or shortness of breath.     Follow Up Instructions Return in about 3 months (around 06/16/2019).     I discussed the assessment and treatment plan with the  patient. The patient was provided an opportunity to ask questions and all were answered. The patient agreed with the plan and demonstrated an understanding of the instructions.   The patient was advised to call back or seek an in-person evaluation if the symptoms worsen or if the condition fails to improve  as anticipated.  I provided 24 minutes of non-face-to-face time during this encounter including median intraservice time, reviewing previous notes, labs, imaging, medications and explaining diagnosis and management.  [name], FNP-BC

## 2019-03-20 MED FILL — ALBUTEROL SULFATE HFA 108 (: 108 (90 BAS | 25 days supply | Qty: 9 | Fill #0

## 2019-03-20 MED FILL — MONTELUKAST SOD 10 MG TAB: 10 | 30 days supply | Qty: 30 | Fill #0

## 2019-03-20 MED FILL — hydrOXYzine HCL 25 MG TABS: 25 | 20 days supply | Qty: 60 | Fill #0

## 2019-03-20 MED FILL — DICLOFENAC SODIUM 1 % GEL: 1 | 12 days supply | Qty: 100 | Fill #0

## 2019-03-20 MED FILL — QUETIAPINE FUMARATE 50 MG T: 50 | 30 days supply | Qty: 60 | Fill #0

## 2019-03-21 ENCOUNTER — Telehealth: Payer: Self-pay | Admitting: Endocrinology

## 2019-03-21 ENCOUNTER — Ambulatory Visit: Payer: Self-pay | Admitting: Nurse Practitioner

## 2019-03-21 MED FILL — LEVOTHYROXINE SODIUM 100 MC: 100 | 30 days supply | Qty: 30 | Fill #2

## 2019-03-21 NOTE — Telephone Encounter (Signed)
MEDICATION: Novolog  PHARMACY:  [facility] A 90 DAY SUPPLY :   IS PATIENT OUT OF MEDICATION:   IF NOT; HOW MUCH IS LEFT: 4-5 days worth  LAST APPOINTMENT DATE: @12 /17/2020  NEXT APPOINTMENT DATE:@03 /03/21  DO WE HAVE YOUR PERMISSION TO LEAVE A DETAILED MESSAGE:yes  OTHER COMMENTS:    **Let patient know to contact pharmacy at the end of the day to make sure medication is ready. **  ** Please notify patient to allow 48-72 hours to process**  **Encourage patient to contact the pharmacy for refills or they can request refills through [facility]**

## 2019-03-24 ENCOUNTER — Other Ambulatory Visit: Payer: Self-pay

## 2019-03-24 DIAGNOSIS — E119 Type 2 diabetes mellitus without complications: Secondary | ICD-10-CM

## 2019-03-24 MED ORDER — INSULIN ASPART 100 UNIT/ML ~~LOC~~ SOLN: SUBCUTANEOUS | 0 refills | Status: DC

## 2019-03-24 MED FILL — ?HUMALOG 100 UNITS/ML VIAL: 100 | 28 days supply | Qty: 10 | Fill #0

## 2019-03-24 NOTE — Telephone Encounter (Signed)
Outpatient Medication Detail   Disp Refills Start End   insulin aspart (NOVOLOG) 100 UNIT/ML injection 10 mL 0 03/24/2019    Sig: 35 units per insulin pump daily; MUST KEEP APPT FOR FUTURE REFILLS TO BE AUTHORIZED   Sent to pharmacy as: insulin aspart (NOVOLOG) 100 UNIT/ML injection   E-Prescribing Status: Receipt confirmed by pharmacy (03/24/2019  7:32 AM EST)

## 2019-03-25 ENCOUNTER — Telehealth (INDEPENDENT_AMBULATORY_CARE_PROVIDER_SITE_OTHER): Payer: Self-pay | Admitting: Licensed Clinical Social Worker

## 2019-03-25 NOTE — Telephone Encounter (Signed)
Call placed to patient regarding [facility] referral. LCSW left message requesting a return call.  

## 2019-03-26 ENCOUNTER — Ambulatory Visit (INDEPENDENT_AMBULATORY_CARE_PROVIDER_SITE_OTHER): Payer: Self-pay | Admitting: Endocrinology

## 2019-03-26 ENCOUNTER — Other Ambulatory Visit: Payer: Self-pay

## 2019-03-26 ENCOUNTER — Encounter: Payer: Self-pay | Admitting: Endocrinology

## 2019-03-26 VITALS — BP 102/60 | HR 88 | Ht 65.0 in | Wt 155.2 lb

## 2019-03-26 DIAGNOSIS — E1059 Type 1 diabetes mellitus with other circulatory complications: Secondary | ICD-10-CM

## 2019-03-26 NOTE — Patient Instructions (Addendum)
check your blood sugar 8 times a day.  vary the time of day when you check, between before the 3 meals, and at bedtime.  also check if you have symptoms of your blood sugar being too high or too low.  please keep a record of the readings and bring it to your next appointment here.  please call us sooner if your blood sugar goes below 70, or if you have a lot of readings over 200.   Please take these pump settings:  basal rate of 1.2 units/hr, 6 AM-10 PM, and 0.6 units/hr overnight.  Suspend for activity for 1-2 hrs No mealtime bolus.  correction bolus (which some people call "sensitivity," or "insulin sensitivity ratio," or just "isr") of 1 unit for each 60 by which your glucose exceeds 100.    On this type of insulin schedule, you should eat meals on a regular schedule.  If a meal is missed or significantly delayed, your blood sugar could go low.   It is very important to see the 2 specialists, to keep you out of the hospital.  That is our goal for now Please come back for a follow-up appointment in 6 weeks.

## 2019-03-26 NOTE — Progress Notes (Signed)
Subjective:    [name], female    DOB: 27-Nov-1970, 49 y.o.   MRN: [id number]  HPI Pt returns for f/u of diabetes mellitus:  DM type: 1  Dx'ed: [id number] Complications: CAD.   Therapy: insulin since dx.  DKA: twice (2020 and 2021) Severe hypoglycemia: never.  Pancreatitis: never Other: she has been on pump (medtronic paradigm) rx since 2014; she stopped continuous glucose monitor, due to cost; rx is limited by lack of health insurance; she is educator, and also caregiver for her parents.   Interval history: She now takes:   basal rate of 0.9 units/hr mealtime bolus of 1 unit/17 grams CHO correction bolus (which some people call "sensitivity," or "insulin sensitivity ratio," or just "isr") of 1 unit for each 60 by which your glucose exceeds 100.  She makes frequent adjustments in her insulin.   She was back in the hospital for DKA 2 weeks ago.  She says this is due to "cyclic vomiting syndrome."  She feels better now.  Phenothiazines did not help, but ativan did.  She feels admission might also have been due to stopping marijuana.  She is sched to see psych soon.   Meter is downloaded today, and the printout is scanned into the record.  She checks 0-2 times per day.  cbg varies from 55->400.   TDD is 35 units per day (approx 50% bolus).   She canceled [facility] appt, due to cost.  Instead, she is planning to see Dr Jennifer Hogan.   Past Medical History:  Diagnosis Date  . Anemia   . Anxiety   . Coronary artery disease    a. s/pPTCA DES x3 in the LAD (ostial DES placed, mid DES placed, distal DES placed)and PTCA/DES x1to intermediate branch 02/2017. b. ACS 02/08/18 with LCx stent placement.  . Depression   . Diabetes mellitus    type 1, on insulin pump  . DKA (diabetic ketoacidoses) ([place]) 12/31/2017  . Elevated platelet count   . High cholesterol   . Hypothyroidism   . Ischemic cardiomyopathy    a. EF low-normal with basal inferior akinesis, basal septal hypokinesis by  echo 01/2018.    Past Surgical History:  Procedure Laterality Date  . APPENDECTOMY    . CORONARY ARTERY BYPASS GRAFT N/A 11/04/2018   Procedure: CORONARY ARTERY BYPASS GRAFTING (CABG), ON PUMP, TIMES THREE, USING LEFT AND RIGHT INTERNAL MAMMARY ARTERIES;  Surgeon: Wonda Olds, MD;  Location: [place];  Service: Open Heart Surgery;  Laterality: N/A;  . CORONARY STENT INTERVENTION N/A 03/20/2017   Procedure: DES x 3 LAD, DES OM; Surgeon: Burnell Blanks, MD;  Location: [place] CV LAB;  Service: Cardiovascular;  Laterality: N/A;  . CORONARY/GRAFT ACUTE MI REVASCULARIZATION N/A 02/08/2018   Procedure: Coronary/Graft Acute MI Revascularization;  Surgeon: Belva Crome, MD;  Location: [place] CV LAB;  Service: Cardiovascular;  Laterality: N/A;  . LEFT HEART CATH AND CORONARY ANGIOGRAPHY N/A 03/20/2017   Procedure: LEFT HEART CATH AND CORONARY ANGIOGRAPHY;  Surgeon: Burnell Blanks, MD;  Location: [place] CV LAB;  Service: Cardiovascular;  Laterality: N/A;  . LEFT HEART CATH AND CORONARY ANGIOGRAPHY N/A 02/08/2018   Procedure: LEFT HEART CATH AND CORONARY ANGIOGRAPHY;  Surgeon: Belva Crome, MD;  Location: [place] [facility] LAB;  Service: Cardiovascular;  Laterality: N/A;  . LEFT HEART CATH AND CORONARY ANGIOGRAPHY N/A 10/29/2018   Procedure: LEFT HEART CATH AND CORONARY ANGIOGRAPHY;  Surgeon: Burnell Blanks, MD;  Location: [facility]  INVASIVE [facility] LAB;  Service: Cardiovascular;  Laterality: N/A;  . TEE WITHOUT CARDIOVERSION N/A 11/04/2018   Procedure: TRANSESOPHAGEAL ECHOCARDIOGRAM (TEE);  Surgeon: Wonda Olds, MD;  Location: [place];  Service: Open Heart Surgery;  Laterality: N/A;    Social History   Socioeconomic History  . Marital status: Divorced    Spouse name: Not on file  . Number of children: Not on file  . Years of education: Not on file  . Highest education level: Not on file  Occupational History  . 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 Smoking status: Never Smoker  . Smokeless tobacco: Never Used  . Tobacco comment: Use marijuana  Substance and Sexual Activity  . Alcohol use: No    Alcohol/week: 0.0 standard drinks  . Drug use: Not Currently    Types: Marijuana    Comment: last use last week, occaisonal marijuana use  . Sexual activity: Not Currently    Birth control/protection: None  Other Topics Concern  . Not on file  Social History Narrative  . Not on file   Social Determinants of Health   Financial Resource Strain:   . Difficulty of Paying Living Expenses: Not on file  Food Insecurity:   . Worried About Charity fundraiser in the Last Year: Not on file  . Ran Out of Food in the Last Year: Not on file  Transportation Needs:   . Lack of Transportation (Medical): Not on file  . Lack of Transportation (Non-Medical): Not on file  Physical Activity:   . Days of Exercise per Week: Not on file  . Minutes of Exercise per Session: Not on file  Stress:   . Feeling of Stress : Not on file  Social Connections:   . Frequency of Communication with Friends and Family: Not on file  . Frequency of Social Gatherings with Friends and Family: Not on file  . Attends Religious Services: Not on file  . Active Member of Clubs or Organizations: Not on file  . Attends Archivist Meetings: Not on file  . Marital Status: Not on file  Intimate Partner Violence:   . Fear of Current or Ex-Partner: Not on file  . Emotionally Abused: Not on file  . Physically Abused: Not on file  . Sexually Abused: Not on file    Current Outpatient Medications on File Prior to Visit  Medication Sig Dispense Refill  . albuterol (VENTOLIN HFA) 108 (90 Base) MCG/ACT inhaler Inhale 2 puffs into the lungs every 6 (six) hours as needed for wheezing or shortness of breath. 18 g 1  . aspirin 81 MG EC tablet Take 1 tablet (81 mg total) by mouth daily. 90 tablet 0  . atorvastatin (LIPITOR) 20 MG tablet Take 1 tablet (20 mg total) by mouth  daily. 90 tablet 3  . clopidogrel (PLAVIX) 75 MG tablet Take 1 tablet (75 mg total) by mouth daily. 90 tablet 3  . diclofenac Sodium (VOLTAREN) 1 % GEL Apply 2 g topically 4 (four) times daily. 200 g 3  . hydrOXYzine (ATARAX/VISTARIL) 25 MG tablet Take 1 tablet (25 mg total) by mouth 3 (three) times daily as needed. 60 tablet 1  . insulin aspart (NOVOLOG) 100 UNIT/ML injection 35 units per insulin pump daily; MUST KEEP APPT FOR FUTURE REFILLS TO BE AUTHORIZED 10 mL 0  . ipratropium (ATROVENT) 0.06 % nasal spray Place 2 sprays into both nostrils 2 (two) times daily as needed for rhinitis.    Marland Kitchen  isosorbide mononitrate (IMDUR) 30 MG 24 hr tablet Take 0.5 tablets (15 mg total) by mouth daily. 45 tablet 3  . levothyroxine (EUTHYROX) 100 MCG tablet Take 1 tablet (100 mcg total) by mouth daily before breakfast. Must have office visit for refills 30 tablet 3  . metoCLOPramide (REGLAN) 5 MG tablet Take 1 tablet (5 mg total) by mouth every 12 (twelve) hours as needed for nausea or vomiting. 90 tablet 1  . montelukast (SINGULAIR) 10 MG tablet Take 1 tablet (10 mg total) by mouth at bedtime. 90 tablet 2  . nitroGLYCERIN (NITROSTAT) 0.4 MG SL tablet Place 1 tablet (0.4 mg total) under the tongue every 5 (five) minutes as needed for chest pain. 30 tablet 0  . ondansetron (ZOFRAN ODT) 4 MG disintegrating tablet Take 1 tablet (4 mg total) by mouth every 8 (eight) hours as needed for nausea or vomiting. 20 tablet 1  . pantoprazole (PROTONIX) 40 MG tablet Take 40 mg by mouth 2 (two) times daily as needed (acid reflux).     . QUEtiapine (SEROQUEL) 50 MG tablet Take 1 tablet (50 mg total) by mouth at bedtime. May increase to 2 tablets or 100 mg at bedtime after 2 weeks if needed 90 tablet 0  . Simethicone (PHAZYME PO) Take 1 tablet by mouth as needed (upset stomach).    . sucralfate (CARAFATE) 1 GM/10ML suspension Take 1 g by mouth as needed.     No current facility-administered medications on file prior to visit.     Allergies  Allergen Reactions  . Atorvastatin     Myalgias on 80mg  dose, tolerates 20mg  ok  . Crestor [Rosuvastatin Calcium] Other (See Comments)    Severe myalgias and joint pain. Has tolerated atorvastatin though  . Peanut-Containing Drug Products Other (See Comments)    Vomiting, upset stomach and some wheezing  . Zetia [Ezetimibe]     myalgias  . Ciprofloxin Hcl [Ciprofloxacin] Nausea And Vomiting and Other (See Comments)    Severe migraine  . Food Color Red [Red Dye] Diarrhea    Family History  Problem Relation Age of Onset  . Coronary artery disease Father   . Congestive Heart Failure Father   . Asthma Father   . Cancer Mother        T cell lymphoma    BP 102/60 (BP Location: Left Arm, Patient Position: Sitting, Cuff Size: Normal)   Pulse 88   Ht 5\' 5"  (1.651 m)   Wt 155 lb 3.2 oz (70.4 kg)   SpO2 97%   BMI 25.83 kg/m    Review of Systems Denies LOC.  She has alternating C and D.      Objective:   Physical Exam VITAL SIGNS:  See vs page GENERAL: no distress.  Anxious.  Pulses: dorsalis pedis intact bilat.   MSK: no deformity of the feet CV: no leg edema Skin:  no ulcer on the feet.  normal color and temp on the feet. Neuro: sensation is intact to touch on the feet  Lab Results  Component Value Date   HGBA1C 9.7 (H) 03/15/2019        Assessment & Plan:  Type 1 DM, with CAD: poor glycemic control Noncompliance with cbg recording: I'll work around this as best I can.  She needs simplest poss insulin regimen. Anxiety: this may be exacerbating C and D.   Patient Instructions  check your blood sugar 8 times a day.  vary the time of day when you check, between before the 3 meals,  and at bedtime.  also check if you have symptoms of your blood sugar being too high or too low.  please keep a record of the readings and bring it to your next appointment here.  please call us sooner if your blood sugar goes below 70, or if you have a lot of readings over 200.    Please take these pump settings:  basal rate of 1.2 units/hr, 6 AM-10 PM, and 0.6 units/hr overnight.  Suspend for activity for 1-2 hrs No mealtime bolus.  correction bolus (which some people call "sensitivity," or "insulin sensitivity ratio," or just "isr") of 1 unit for each 60 by which your glucose exceeds 100.    On this type of insulin schedule, you should eat meals on a regular schedule.  If a meal is missed or significantly delayed, your blood sugar could go low.   It is very important to see the 2 specialists, to keep you out of the hospital.  That is our goal for now Please come back for a follow-up appointment in 6 weeks.

## 2019-03-27 ENCOUNTER — Encounter: Payer: Self-pay | Admitting: Nurse Practitioner

## 2019-03-31 ENCOUNTER — Encounter: Payer: Self-pay | Admitting: Endocrinology

## 2019-04-01 ENCOUNTER — Other Ambulatory Visit: Payer: Self-pay

## 2019-04-01 ENCOUNTER — Ambulatory Visit: Payer: Self-pay | Attending: Nurse Practitioner

## 2019-04-01 DIAGNOSIS — Z09 Encounter for follow-up examination after completed treatment for conditions other than malignant neoplasm: Secondary | ICD-10-CM

## 2019-04-02 LAB — CMP14+EGFR
ALT: 28 IU/L (ref 0–32)
AST: 44 IU/L — ABNORMAL HIGH (ref 0–40)
Albumin/Globulin Ratio: 1.4 (ref 1.2–2.2)
Albumin: 3.8 g/dL (ref 3.8–4.8)
Alkaline Phosphatase: 103 IU/L (ref 39–117)
BUN/Creatinine Ratio: 15 (ref 9–23)
BUN: 14 mg/dL (ref 6–24)
Bilirubin Total: 0.2 mg/dL (ref 0.0–1.2)
CO2: 21 mmol/L (ref 20–29)
Calcium: 9.1 mg/dL (ref 8.7–10.2)
Chloride: 102 mmol/L (ref 96–106)
Creatinine, Ser: 0.91 mg/dL (ref 0.57–1.00)
GFR calc Af Amer: 85 mL/min/{1.73_m2} (ref >59)
GFR calc non Af Amer: 74 mL/min/{1.73_m2} (ref >59)
Globulin, Total: 2.7 g/dL (ref 1.5–4.5)
Glucose: 230 mg/dL — ABNORMAL HIGH (ref 65–99)
Potassium: 5.2 mmol/L (ref 3.5–5.2)
Sodium: 137 mmol/L (ref 134–144)
Total Protein: 6.5 g/dL (ref 6.0–8.5)

## 2019-04-02 LAB — CBC
Hematocrit: 34.1 % (ref 34.0–46.6)
Hemoglobin: 11.1 g/dL (ref 11.1–15.9)
MCH: 28.7 pg (ref 26.6–33.0)
MCHC: 32.6 g/dL (ref 31.5–35.7)
MCV: 88 fL (ref 79–97)
Platelets: 443 10*3/uL (ref 150–450)
RBC: 3.87 x10E6/uL (ref 3.77–5.28)
RDW: 15.3 % (ref 11.7–15.4)
WBC: 6.3 10*3/uL (ref 3.4–10.8)

## 2019-04-07 ENCOUNTER — Other Ambulatory Visit: Payer: Self-pay

## 2019-04-07 ENCOUNTER — Ambulatory Visit: Payer: Self-pay | Attending: Nurse Practitioner | Admitting: Licensed Clinical Social Worker

## 2019-04-07 DIAGNOSIS — F411 Generalized anxiety disorder: Secondary | ICD-10-CM

## 2019-04-07 DIAGNOSIS — F331 Major depressive disorder, recurrent, moderate: Secondary | ICD-10-CM
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
## 2019-04-16 ENCOUNTER — Other Ambulatory Visit: Payer: Self-pay

## 2019-04-16 ENCOUNTER — Ambulatory Visit: Payer: Self-pay | Admitting: Licensed Clinical Social Worker

## 2019-04-16 NOTE — BH Specialist Note (Signed)
Opened in error

## 2019-04-17 ENCOUNTER — Ambulatory Visit: Payer: Self-pay | Admitting: Licensed Clinical Social Worker

## 2019-04-17 ENCOUNTER — Other Ambulatory Visit: Payer: Self-pay

## 2019-04-18 ENCOUNTER — Encounter: Payer: Self-pay | Admitting: Nurse Practitioner

## 2019-04-21 ENCOUNTER — Other Ambulatory Visit: Payer: Self-pay | Admitting: Endocrinology

## 2019-04-21 MED FILL — ?HUMALOG 100 UNITS/ML VIAL: 100 | 28 days supply | Qty: 10 | Fill #0

## 2019-04-24 ENCOUNTER — Ambulatory Visit: Payer: Self-pay | Attending: Nurse Practitioner | Admitting: Licensed Clinical Social Worker

## 2019-04-24 ENCOUNTER — Other Ambulatory Visit: Payer: Self-pay

## 2019-04-24 DIAGNOSIS — F411 Generalized anxiety disorder: Secondary | ICD-10-CM

## 2019-04-24 DIAGNOSIS — F331 Major depressive disorder, recurrent, moderate: Secondary | ICD-10-CM

## 2019-04-24 NOTE — BH Specialist Note (Signed)
Integrated Behavioral Health Visit via Telemedicine (Telephone)  04/24/2019 HELON JEWELL [id number]   Session Start time: 3:05 PM  Session End time: 3:40 PM Total time: 35   Referring Provider: NP Raul Del Type of Visit: Telephonic Patient location: Home [facility] Provider location: Office All persons participating in visit: LCSW and Patient  Confirmed patient's address: Yes  Confirmed patient's phone number: Yes  Any changes to demographics: No   Confirmed patient's insurance: Yes  Any changes to patient's insurance: No   Discussed confidentiality: Yes    The following statements were read to the patient and/or legal guardian that are established with the [facility] Provider.  "The purpose of this phone visit is to provide behavioral health care while limiting exposure to the coronavirus (COVID19).  There is a possibility of technology failure and discussed alternative modes of communication if that failure occurs."  "By engaging in this telephone visit, you consent to the provision of healthcare.  Additionally, you authorize for your insurance to be billed for the services provided during this telephone visit."   Patient and/or legal guardian consented to telephone visit: Yes   PRESENTING CONCERNS: Patient and/or family reports the following symptoms/concerns: Pt reports difficulty managing anxiety and depression symptoms triggered by psychosocial stressors Duration of problem: Ongoing; Severity of problem: moderate  STRENGTHS (Protective Factors/Coping Skills): Pt has good insight Pt has desire to change  GOALS ADDRESSED: Patient will: 1.  Reduce symptoms of: anxiety and depression  2.  Increase knowledge and/or ability of: self-management skills  3.  Demonstrate ability to: Increase healthy adjustment to current life circumstances, Increase adequate support systems for patient/family and Decrease self-medicating behaviors  INTERVENTIONS: Interventions utilized:   Behavioral Activation and Supportive Counseling Standardized Assessments completed: Not Needed  ASSESSMENT: Patient currently experiencing difficulty managing mental health conditions triggered by psychosocial stressors.   Patient may benefit from continued medication management and brief therapy.  PLAN: 1. Follow up with behavioral health clinician on : 05/01/19 2. Behavioral recommendations: Continue to utilize strategies discussed 3. Referral(s): [place] (In Clinic)  Rebekah Chesterfield, [place] 05/12/19 9:30 AM

## 2019-04-28 ENCOUNTER — Other Ambulatory Visit: Payer: Self-pay | Admitting: Cardiothoracic Surgery

## 2019-04-28 MED FILL — LEVOTHYROXINE SODIUM 100 MC: 100 | 30 days supply | Qty: 30 | Fill #3

## 2019-04-28 MED FILL — ALBUTEROL SULFATE HFA 108 (: 108 (90 BAS | 25 days supply | Qty: 9 | Fill #1

## 2019-04-28 MED FILL — hydrOXYzine HCL 25 MG TABS: 25 | 20 days supply | Qty: 60 | Fill #1

## 2019-04-28 MED FILL — MONTELUKAST SOD 10 MG TAB: 10 | 30 days supply | Qty: 30 | Fill #1

## 2019-04-28 MED FILL — DICLOFENAC SODIUM 1 % GEL: 1 | 12 days supply | Qty: 100 | Fill #1

## 2019-05-01 ENCOUNTER — Encounter (HOSPITAL_COMMUNITY): Payer: Self-pay

## 2019-05-01 ENCOUNTER — Other Ambulatory Visit: Payer: Self-pay

## 2019-05-01 ENCOUNTER — Ambulatory Visit: Payer: Self-pay | Admitting: Licensed Clinical Social Worker

## 2019-05-01 ENCOUNTER — Inpatient Hospital Stay (HOSPITAL_COMMUNITY)
Admission: EM | Admit: 2019-05-01 | Discharge: 2019-05-02 | DRG: 638 | Disposition: A | Discharge status: Home / Self Care | Payer: Self-pay | Attending: Internal Medicine | Admitting: Internal Medicine

## 2019-05-01 ENCOUNTER — Emergency Department (HOSPITAL_COMMUNITY): Payer: Self-pay

## 2019-05-01 DIAGNOSIS — Z794 Long term (current) use of insulin: Secondary | ICD-10-CM

## 2019-05-01 DIAGNOSIS — E1110 Type 2 diabetes mellitus with ketoacidosis without coma: Secondary | ICD-10-CM | POA: Diagnosis present

## 2019-05-01 DIAGNOSIS — E7800 Pure hypercholesterolemia, unspecified: Secondary | ICD-10-CM | POA: Diagnosis present

## 2019-05-01 DIAGNOSIS — Z9861 Coronary angioplasty status: Secondary | ICD-10-CM

## 2019-05-01 DIAGNOSIS — Z91010 Allergy to peanuts: Secondary | ICD-10-CM

## 2019-05-01 DIAGNOSIS — I2510 Atherosclerotic heart disease of native coronary artery without angina pectoris: Secondary | ICD-10-CM | POA: Diagnosis present

## 2019-05-01 DIAGNOSIS — F419 Anxiety disorder, unspecified: Secondary | ICD-10-CM | POA: Diagnosis present

## 2019-05-01 DIAGNOSIS — F12988 Cannabis use, unspecified with other cannabis-induced disorder: Secondary | ICD-10-CM | POA: Diagnosis present

## 2019-05-01 DIAGNOSIS — E1010 Type 1 diabetes mellitus with ketoacidosis without coma: Principal | ICD-10-CM | POA: Diagnosis present

## 2019-05-01 DIAGNOSIS — Z91048 Other nonmedicinal substance allergy status: Secondary | ICD-10-CM

## 2019-05-01 DIAGNOSIS — N179 Acute kidney failure, unspecified: Secondary | ICD-10-CM | POA: Diagnosis present

## 2019-05-01 DIAGNOSIS — R112 Nausea with vomiting, unspecified: Secondary | ICD-10-CM | POA: Diagnosis present

## 2019-05-01 DIAGNOSIS — Z20822 Contact with and (suspected) exposure to covid-19: Secondary | ICD-10-CM | POA: Diagnosis present

## 2019-05-01 DIAGNOSIS — E109 Type 1 diabetes mellitus without complications: Secondary | ICD-10-CM | POA: Diagnosis present

## 2019-05-01 DIAGNOSIS — Z951 Presence of aortocoronary bypass graft: Secondary | ICD-10-CM

## 2019-05-01 DIAGNOSIS — Z8249 Family history of ischemic heart disease and other diseases of the circulatory system: Secondary | ICD-10-CM

## 2019-05-01 DIAGNOSIS — Z7982 Long term (current) use of aspirin: Secondary | ICD-10-CM

## 2019-05-01 DIAGNOSIS — Z888 Allergy status to other drugs, medicaments and biological substances status: Secondary | ICD-10-CM

## 2019-05-01 DIAGNOSIS — Z825 Family history of asthma and other chronic lower respiratory diseases: Secondary | ICD-10-CM

## 2019-05-01 DIAGNOSIS — Z881 Allergy status to other antibiotic agents status: Secondary | ICD-10-CM

## 2019-05-01 DIAGNOSIS — E785 Hyperlipidemia, unspecified: Secondary | ICD-10-CM | POA: Diagnosis present

## 2019-05-01 DIAGNOSIS — R1110 Vomiting, unspecified: Secondary | ICD-10-CM

## 2019-05-01 DIAGNOSIS — E039 Hypothyroidism, unspecified: Secondary | ICD-10-CM | POA: Diagnosis present

## 2019-05-01 DIAGNOSIS — Z7902 Long term (current) use of antithrombotics/antiplatelets: Secondary | ICD-10-CM

## 2019-05-01 DIAGNOSIS — I255 Ischemic cardiomyopathy: Secondary | ICD-10-CM | POA: Diagnosis present

## 2019-05-01 DIAGNOSIS — Z9641 Presence of insulin pump (external) (internal): Secondary | ICD-10-CM | POA: Diagnosis present

## 2019-05-01 LAB — BASIC METABOLIC PANEL
Anion gap: 17 — ABNORMAL HIGH (ref 5–15)
BUN: 21 mg/dL — ABNORMAL HIGH (ref 6–20)
CO2: 17 mmol/L — ABNORMAL LOW (ref 22–32)
Calcium: 9.6 mg/dL (ref 8.9–10.3)
Chloride: 99 mmol/L (ref 98–111)
Creatinine, Ser: 1.24 mg/dL — ABNORMAL HIGH (ref 0.44–1.00)
GFR calc Af Amer: 59 mL/min — ABNORMAL LOW (ref >60)
GFR calc non Af Amer: 51 mL/min — ABNORMAL LOW (ref >60)
Glucose, Bld: 354 mg/dL — ABNORMAL HIGH (ref 70–99)
Potassium: 3.4 mmol/L — ABNORMAL LOW (ref 3.5–5.1)
Sodium: 133 mmol/L — ABNORMAL LOW (ref 135–145)

## 2019-05-01 LAB — HEPATIC FUNCTION PANEL
ALT: 26 U/L (ref 0–44)
AST: 48 U/L — ABNORMAL HIGH (ref 15–41)
Albumin: 4.5 g/dL (ref 3.5–5.0)
Alkaline Phosphatase: 120 U/L (ref 38–126)
Bilirubin, Direct: <0.1 mg/dL (ref 0.0–0.2)
Total Bilirubin: 1 mg/dL (ref 0.3–1.2)
Total Protein: 8.6 g/dL — ABNORMAL HIGH (ref 6.5–8.1)

## 2019-05-01 LAB — URINALYSIS, ROUTINE W REFLEX MICROSCOPIC
Bilirubin Urine: NEGATIVE
Glucose, UA: >=500 mg/dL — AB
Ketones, ur: 80 mg/dL — AB
Leukocytes,Ua: NEGATIVE
Nitrite: NEGATIVE
Protein, ur: 100 mg/dL — AB
Specific Gravity, Urine: 1.026 (ref 1.005–1.030)
pH: 5 (ref 5.0–8.0)

## 2019-05-01 LAB — CBG MONITORING, ED
Glucose-Capillary: 336 mg/dL — ABNORMAL HIGH (ref 70–99)
Glucose-Capillary: 352 mg/dL — ABNORMAL HIGH (ref 70–99)
Glucose-Capillary: 200 mg/dL — ABNORMAL HIGH (ref 70–99)
Glucose-Capillary: 129 mg/dL — ABNORMAL HIGH (ref 70–99)
Glucose-Capillary: 113 mg/dL — ABNORMAL HIGH (ref 70–99)
Glucose-Capillary: 125 mg/dL — ABNORMAL HIGH (ref 70–99)
Glucose-Capillary: 143 mg/dL — ABNORMAL HIGH (ref 70–99)

## 2019-05-01 LAB — LIPASE, BLOOD: Lipase: 17 U/L (ref 11–51)

## 2019-05-01 LAB — CBC
HCT: 37.2 % (ref 36.0–46.0)
Hemoglobin: 12.1 g/dL (ref 12.0–15.0)
MCH: 28.3 pg (ref 26.0–34.0)
MCHC: 32.5 g/dL (ref 30.0–36.0)
MCV: 87.1 fL (ref 80.0–100.0)
Platelets: 502 10*3/uL — ABNORMAL HIGH (ref 150–400)
RBC: 4.27 MIL/uL (ref 3.87–5.11)
RDW: 15.9 % — ABNORMAL HIGH (ref 11.5–15.5)
WBC: 14.4 10*3/uL — ABNORMAL HIGH (ref 4.0–10.5)
nRBC: 0 % (ref 0.0–0.2)

## 2019-05-01 LAB — RAPID URINE DRUG SCREEN, HOSP PERFORMED
Amphetamines: NOT DETECTED
Barbiturates: NOT DETECTED
Benzodiazepines: NOT DETECTED
Cocaine: NOT DETECTED
Opiates: NOT DETECTED
Tetrahydrocannabinol: POSITIVE — AB

## 2019-05-01 LAB — I-STAT BETA HCG BLOOD, ED (MC, WL, AP ONLY): I-stat hCG, quantitative: <5 m[IU]/mL (ref <5)

## 2019-05-01 LAB — BETA-HYDROXYBUTYRIC ACID: Beta-Hydroxybutyric Acid: 2.26 mmol/L — ABNORMAL HIGH (ref 0.05–0.27)

## 2019-05-01 MED ORDER — ACETAMINOPHEN 650 MG RE SUPP: 650.0000 mg | Freq: Four times a day (QID) | RECTAL | Status: DC | PRN

## 2019-05-01 MED ORDER — ISOSORBIDE MONONITRATE ER 30 MG PO TB24
15.0000 mg | ORAL_TABLET | Freq: Every day | ORAL | Status: DC
  Administered 2019-05-01: 15 mg via ORAL
  Filled 2019-05-01: qty 1

## 2019-05-01 MED ORDER — LORAZEPAM 2 MG/ML IJ SOLN
0.5000 mg | Freq: Once | INTRAMUSCULAR | Status: AC
  Administered 2019-05-01: 0.5 mg via INTRAVENOUS
  Filled 2019-05-01: qty 1

## 2019-05-01 MED ORDER — CLOPIDOGREL BISULFATE 75 MG PO TABS
75.0000 mg | ORAL_TABLET | Freq: Every day | ORAL | Status: DC
  Administered 2019-05-02: 11:00:00 75 mg via ORAL
  Filled 2019-05-01 (×2): qty 1

## 2019-05-01 MED ORDER — LEVOTHYROXINE SODIUM 100 MCG PO TABS: 100.0000 ug | ORAL_TABLET | Freq: Every day | ORAL | Status: DC

## 2019-05-01 MED ORDER — POTASSIUM CHLORIDE 10 MEQ/100ML IV SOLN
10.0000 meq | INTRAVENOUS | Status: AC
  Administered 2019-05-01 (×3): 10 meq via INTRAVENOUS
  Filled 2019-05-01 (×3): qty 100

## 2019-05-01 MED ORDER — ACETAMINOPHEN 325 MG PO TABS: 650.0000 mg | ORAL_TABLET | Freq: Four times a day (QID) | ORAL | Status: DC | PRN

## 2019-05-01 MED ORDER — SODIUM CHLORIDE 0.9 % IV SOLN: INTRAVENOUS | Status: DC

## 2019-05-01 MED ORDER — ALBUTEROL SULFATE (2.5 MG/3ML) 0.083% IN NEBU: 2.5000 mg | INHALATION_SOLUTION | Freq: Four times a day (QID) | RESPIRATORY_TRACT | Status: DC | PRN

## 2019-05-01 MED ORDER — ASPIRIN EC 81 MG PO TBEC
81.0000 mg | DELAYED_RELEASE_TABLET | Freq: Every day | ORAL | Status: DC
  Administered 2019-05-02: 11:00:00 81 mg via ORAL
  Filled 2019-05-01 (×2): qty 1

## 2019-05-01 MED ORDER — ENOXAPARIN SODIUM 40 MG/0.4ML ~~LOC~~ SOLN
40.0000 mg | SUBCUTANEOUS | Status: DC
  Administered 2019-05-01: 40 mg via SUBCUTANEOUS
  Filled 2019-05-01: qty 0.4

## 2019-05-01 MED ORDER — HYDROXYZINE HCL 25 MG PO TABS: 25.0000 mg | ORAL_TABLET | Freq: Three times a day (TID) | ORAL | Status: DC | PRN

## 2019-05-01 MED ORDER — MONTELUKAST SODIUM 10 MG PO TABS
10.0000 mg | ORAL_TABLET | Freq: Every day | ORAL | Status: DC
  Administered 2019-05-01: 10 mg via ORAL
  Filled 2019-05-01 (×2): qty 1

## 2019-05-01 MED ORDER — DEXTROSE 50 % IV SOLN: 0.0000 mL | INTRAVENOUS | Status: DC | PRN

## 2019-05-01 MED ORDER — PANTOPRAZOLE SODIUM 40 MG PO TBEC: 40.0000 mg | DELAYED_RELEASE_TABLET | Freq: Two times a day (BID) | ORAL | Status: DC | PRN

## 2019-05-01 MED ORDER — INSULIN REGULAR(HUMAN) IN NACL 100-0.9 UT/100ML-% IV SOLN
INTRAVENOUS | Status: DC
  Administered 2019-05-01: 19:00:00 3.6 [IU]/h via INTRAVENOUS
  Filled 2019-05-01: qty 100

## 2019-05-01 MED ORDER — SODIUM CHLORIDE 0.9 % IV BOLUS
1000.0000 mL | INTRAVENOUS | Status: AC
  Administered 2019-05-01 (×2): 1000 mL via INTRAVENOUS

## 2019-05-01 MED ORDER — ATORVASTATIN CALCIUM 10 MG PO TABS
20.0000 mg | ORAL_TABLET | Freq: Every day | ORAL | Status: DC
  Administered 2019-05-01: 23:00:00 20 mg via ORAL
  Filled 2019-05-01: qty 2

## 2019-05-01 MED ORDER — ONDANSETRON HCL 4 MG/2ML IJ SOLN: 4.0000 mg | Freq: Four times a day (QID) | INTRAMUSCULAR | Status: DC | PRN

## 2019-05-01 MED ORDER — LORAZEPAM 1 MG PO TABS
0.5000 mg | ORAL_TABLET | Freq: Once | ORAL | Status: AC
  Administered 2019-05-01: 0.5 mg via ORAL
  Filled 2019-05-01: qty 1

## 2019-05-01 MED ORDER — DEXTROSE-NACL 5-0.45 % IV SOLN: INTRAVENOUS | Status: DC

## 2019-05-01 NOTE — ED Notes (Signed)
Pt has personal insulin pump to [employer], discontinued and placed in pt property bag,.

## 2019-05-01 NOTE — H&P (Signed)
History and Physical    Jennifer Hogan [id number] DOB: December 04, 1970 DOA: 05/01/2019  PCP: [name], NP Patient coming from: Home  Chief Complaint: Emesis  HPI: Jennifer Hogan is a 49 y.o. female with medical history significant of type 1 diabetes on insulin pump, CAD status post CABG and PCI, hyperlipidemia, hypothyroidism, anemia, anxiety, depression presenting to the ED with complaints of emesis.  Patient states she has anxiety for which she takes hydroxyzine and uses marijuana.  She has cut down on her marijuana use and has not used it for the past 1 week.  2 days ago at dinner she started feeling nauseous and soon after vomited.  She has continued to vomit for the past 2 days.  Her blood glucose has been elevated, above 300.  States she has been having spasms in her epigastric region which she feels are related to her vagus nerve.  States after receiving Ativan in the ED her tachycardia and epigastric spasms have resolved.  She is no longer nauseous.  She received her second Covid vaccine recently.  No cough or shortness of breath.  Her endocrinologist is Dr. Loanne Drilling.  She has an insulin pump in place..  ED Course: Afebrile. Tachycardiac and tachypnic. BP slightly elevated.  Labs showing WBC count 14.4.  Potassium 3.4.  Bicarb 17, anion gap 17.  Blood glucose 354.  UA with ketones but not suggestive of infection.  Beta hydroxybutyric acid elevated at 2.26.  BUN 21, creatinine 1.2.  Baseline creatinine 0.9.  UDS positive for THC.  Lipase normal and no significant elevation of LFTs. Chest x-ray showing no active disease. Patient was started on insulin infusion and IV fluid per DKA protocol.  Potassium supplementation given.  Review of Systems:  All systems reviewed and apart from history of presenting illness, are negative.  Past Medical History:  Diagnosis Date  . Anemia   . Anxiety   . Coronary artery disease    a. s/pPTCA DES x3 in the LAD (ostial DES placed, mid DES  placed, distal DES placed)and PTCA/DES x1to intermediate branch 02/2017. b. ACS 02/08/18 with LCx stent placement.  . Depression   . Diabetes mellitus    type 1, on insulin pump  . DKA (diabetic ketoacidoses) ([place]) 12/31/2017  . Elevated platelet count   . High cholesterol   . Hypothyroidism   . Ischemic cardiomyopathy    a. EF low-normal with basal inferior akinesis, basal septal hypokinesis by echo 01/2018.    Past Surgical History:  Procedure Laterality Date  . APPENDECTOMY    . CORONARY ARTERY BYPASS GRAFT N/A 11/04/2018   Procedure: CORONARY ARTERY BYPASS GRAFTING (CABG), ON PUMP, TIMES THREE, USING LEFT AND RIGHT INTERNAL MAMMARY ARTERIES;  Surgeon: Wonda Olds, MD;  Location: [facility];  Service: Open Heart Surgery;  Laterality: N/A;  . CORONARY STENT INTERVENTION N/A 03/20/2017   Procedure: DES x 3 LAD, DES OM; Surgeon: Burnell Blanks, MD;  Location: [facility];  Service: Cardiovascular;  Laterality: N/A;  . CORONARY/GRAFT ACUTE MI REVASCULARIZATION N/A 02/08/2018   Procedure: Coronary/Graft Acute MI Revascularization;  Surgeon: Belva Crome, MD;  Location: [facility];  Service: Cardiovascular;  Laterality: N/A;  . LEFT HEART CATH AND CORONARY ANGIOGRAPHY N/A 03/20/2017   Procedure: LEFT HEART CATH AND CORONARY ANGIOGRAPHY;  Surgeon: Burnell Blanks, MD;  Location: [facility];  Service: Cardiovascular;  Laterality: N/A;  . LEFT HEART CATH AND CORONARY ANGIOGRAPHY N/A 02/08/2018   Procedure: LEFT HEART  CATH AND CORONARY ANGIOGRAPHY;  Surgeon: Belva Crome, MD;  Location: [facility];  Service: Cardiovascular;  Laterality: N/A;  . LEFT HEART CATH AND CORONARY ANGIOGRAPHY N/A 10/29/2018   Procedure: LEFT HEART CATH AND CORONARY ANGIOGRAPHY;  Surgeon: Burnell Blanks, MD;  Location: [facility];  Service: Cardiovascular;  Laterality: N/A;  . TEE WITHOUT CARDIOVERSION N/A 11/04/2018   Procedure: TRANSESOPHAGEAL  ECHOCARDIOGRAM (TEE);  Surgeon: Wonda Olds, MD;  Location: [facility];  Service: Open Heart Surgery;  Laterality: N/A;     reports that she has never smoked. She has never used smokeless tobacco. She reports previous drug use. Drug: Marijuana. She reports that she does not drink alcohol.  Allergies  Allergen Reactions  . Atorvastatin     Myalgias on 80mg  dose, tolerates 20mg  ok  . Crestor [Rosuvastatin Calcium] Other (See Comments)    Severe myalgias and joint pain. Has tolerated atorvastatin though  . Peanut-Containing Drug Products Other (See Comments)    Vomiting, upset stomach and some wheezing  . Zetia [Ezetimibe]     myalgias  . Ciprofloxin Hcl [Ciprofloxacin] Nausea And Vomiting and Other (See Comments)    Severe migraine  . Food Color Red [Red Dye] Diarrhea    Family History  Problem Relation Age of Onset  . Coronary artery disease Father   . Congestive Heart Failure Father   . Asthma Father   . Cancer Mother        T cell lymphoma    Prior to Admission medications   Medication Sig Start Date End Date Taking? Authorizing Provider  aspirin 81 MG EC tablet Take 1 tablet (81 mg total) by mouth daily. 12/07/17  Yes Amin, Ankit Chirag, MD  atorvastatin (LIPITOR) 20 MG tablet Take 1 tablet (20 mg total) by mouth daily. 02/19/19 02/19/20 Yes Turner, Eber Hong, MD  clopidogrel (PLAVIX) 75 MG tablet Take 1 tablet (75 mg total) by mouth daily. 03/05/19  Yes Turner, Eber Hong, MD  diclofenac Sodium (VOLTAREN) 1 % GEL Apply 2 g topically 4 (four) times daily. Patient taking differently: Apply 2 g topically daily as needed (For pain).  03/19/19  Yes [name], NP  HUMALOG 100 UNIT/ML injection 35 UNITS PER INSULIN PUMP DAILY; MUST KEEP APPT FOR FUTURE REFILLS TO BE AUTHORIZED Patient taking differently: Inject 35 Units into the skin See admin instructions. 35 units daily continues insulin pump 04/21/19  Yes Renato Shin, MD  hydrOXYzine (ATARAX/VISTARIL) 25 MG tablet Take 1 tablet  (25 mg total) by mouth 3 (three) times daily as needed. Patient taking differently: Take 25 mg by mouth 3 (three) times daily as needed for anxiety.  03/19/19  Yes [name], NP  ipratropium (ATROVENT) 0.06 % nasal spray Place 2 sprays into both nostrils 2 (two) times daily as needed for rhinitis.   Yes [provider]  isosorbide mononitrate (IMDUR) 30 MG 24 hr tablet Take 0.5 tablets (15 mg total) by mouth daily. 01/08/19  Yes [name], MD  levothyroxine (EUTHYROX) 100 MCG tablet Take 1 tablet (100 mcg total) by mouth daily before breakfast. Must have office visit for refills 12/17/18  Yes Freeman Caldron M, PA-C  metoCLOPramide (REGLAN) 5 MG tablet Take 1 tablet (5 mg total) by mouth every 12 (twelve) hours as needed for nausea or vomiting. 12/17/18 12/17/19 Yes McClung, Dionne Bucy, PA-C  montelukast (SINGULAIR) 10 MG tablet Take 1 tablet (10 mg total) by mouth at bedtime. 03/19/19 06/17/19 Yes [name], NP  nitroGLYCERIN (NITROSTAT) 0.4 MG SL tablet Place 1 tablet (0.4 mg total) under the tongue every 5 (five) minutes as needed for chest pain. 01/08/19  Yes Turner, Eber Hong, MD  ondansetron (ZOFRAN ODT) 4 MG disintegrating tablet Take 1 tablet (4 mg total) by mouth every 8 (eight) hours as needed for nausea or vomiting. 12/17/18  Yes McClung, Angela M, PA-C  pantoprazole (PROTONIX) 40 MG tablet Take 40 mg by mouth 2 (two) times daily as needed (acid reflux).    Yes [provider]  albuterol (VENTOLIN HFA) 108 (90 Base) MCG/ACT inhaler Inhale 2 puffs into the lungs every 6 (six) hours as needed for wheezing or shortness of breath. 03/19/19 04/18/19  [name], NP  insulin aspart (NOVOLOG) 100 UNIT/ML injection 35 units per insulin pump daily; MUST KEEP APPT FOR FUTURE REFILLS TO BE AUTHORIZED Patient not taking: Reported on 05/01/2019 03/24/19   Renato Shin, MD  QUEtiapine (SEROQUEL) 50 MG tablet Take 1 tablet (50 mg total) by mouth at bedtime. May increase to 2  tablets or 100 mg at bedtime after 2 weeks if needed Patient not taking: Reported on 05/01/2019 03/19/19 06/17/19  [name], NP    Physical Exam: Vitals:   05/01/19 1815 05/01/19 1900 05/01/19 1915 05/01/19 2000  BP: (!) 155/94 (!) 163/94 (!) 169/92 (!) 158/85  Pulse: (!) 126 (!) 131 (!) 125 (!) 127  Resp: (!) 25 20 (!) 25 19  Temp:      TempSrc:      SpO2: 100% 100% 100% 100%  Weight:      Height:        Physical Exam  Constitutional: She is oriented to person, place, and time. She appears well-developed and well-nourished. No distress.  HENT:  Head: Normocephalic.  Eyes: Right eye exhibits no discharge. Left eye exhibits no discharge.  Cardiovascular: Regular rhythm and intact distal pulses.  Mildly tachycardic  Pulmonary/Chest: Effort normal and breath sounds normal. No respiratory distress. She has no wheezes. She has no rales.  Abdominal: Soft. Bowel sounds are normal. She exhibits no distension. There is no rebound and no guarding.  Mild epigastric tenderness  Musculoskeletal:        General: No edema.     Cervical back: Neck supple.  Neurological: She is alert and oriented to person, place, and time.  Skin: Skin is warm and dry. She is not diaphoretic.     Labs on Admission: I have personally reviewed following labs and imaging studies  CBC: Recent Labs  Lab 05/01/19 1143  WBC 14.4*  HGB 12.1  HCT 37.2  MCV 87.1  PLT [id number]*   Basic Metabolic Panel: Recent Labs  Lab 05/01/19 1143  NA 133*  K 3.4*  CL 99  CO2 17*  GLUCOSE 354*  BUN 21*  CREATININE 1.24*  CALCIUM 9.6   GFR: Estimated Creatinine Clearance: 48.8 mL/min (A) (by C-G formula based on SCr of 1.24 mg/dL (H)). Liver Function Tests: Recent Labs  Lab 05/01/19 1750  AST 48*  ALT 26  ALKPHOS 120  BILITOT 1.0  PROT 8.6*  ALBUMIN 4.5   Recent Labs  Lab 05/01/19 1750  LIPASE 17   No results for input(s): AMMONIA in the last 168 hours. Coagulation Profile: No results for input(s):  INR, PROTIME in the last 168 hours. Cardiac Enzymes: No results for input(s): CKTOTAL, CKMB, CKMBINDEX, TROPONINI in the last 168 hours. BNP (last 3 results) No results for input(s): PROBNP in the last 8760 hours. HbA1C: No results  for input(s): HGBA1C in the last 72 hours. CBG: Recent Labs  Lab 05/01/19 1132 05/01/19 1558 05/01/19 1851 05/01/19 1957 05/01/19 2057  GLUCAP 336* 352* 200* 129* 113*   Lipid Profile: No results for input(s): CHOL, HDL, LDLCALC, TRIG, CHOLHDL, LDLDIRECT in the last 72 hours. Thyroid Function Tests: No results for input(s): TSH, T4TOTAL, FREET4, T3FREE, THYROIDAB in the last 72 hours. Anemia Panel: No results for input(s): VITAMINB12, FOLATE, FERRITIN, TIBC, IRON, RETICCTPCT in the last 72 hours. Urine analysis:    Component Value Date/Time   COLORURINE YELLOW 05/01/2019 1722   APPEARANCEUR HAZY (A) 05/01/2019 1722   LABSPEC 1.026 05/01/2019 1722   PHURINE 5.0 05/01/2019 1722   GLUCOSEU >=500 (A) 05/01/2019 1722   GLUCOSEU NEGATIVE 11/23/2015 0831   HGBUR MODERATE (A) 05/01/2019 1722   BILIRUBINUR NEGATIVE 05/01/2019 1722   BILIRUBINUR neg 12/07/2015 0843   KETONESUR 80 (A) 05/01/2019 1722   PROTEINUR 100 (A) 05/01/2019 1722   UROBILINOGEN 1.0 12/07/2015 0843   UROBILINOGEN 0.2 11/23/2015 0831   NITRITE NEGATIVE 05/01/2019 1722   LEUKOCYTESUR NEGATIVE 05/01/2019 1722    Radiological Exams on Admission: DG Chest Portable 1 View  Result Date: 05/01/2019 CLINICAL DATA:  DKA EXAM: PORTABLE CHEST 1 VIEW COMPARISON:  12/09/2018 FINDINGS: Prior CABG. Heart and mediastinal contours are within normal limits. No focal opacities or effusions. No acute bony abnormality. IMPRESSION: No active cardiopulmonary disease. Electronically Signed   By: Rolm Baptise M.D.   On: 05/01/2019 19:00    EKG: Independently reviewed.  Sinus tachycardia, baseline wander in inferior leads.  Assessment/Plan Principal Problem:   DKA (diabetic ketoacidoses) (HCC) Active  Problems:   Hypothyroidism   Type 1 diabetes mellitus (HCC)   Emesis   AKI (acute kidney injury) ([place])   DKA in the setting of uncontrolled type 1 diabetes on insulin pump: Blood glucose 354.  Bicarb 17, anion gap 17.  UA with ketones.  Beta hydroxybutyrate acid elevated at 2.26.  Last A1c 9.7 on 03/15/2019.  Mild leukocytosis is likely reactive.  No infectious etiology identified. -Plan: Insulin pump has been switched off.  Continue insulin infusion and IV fluids per DKA protocol.  Potassium 3.4 on initial labs and repleted.  Currently on D5-1/2 normal saline infusion as CBG in the 100s.  Repeat BMP.  When DKA resolves, initiate subcutaneous insulin and diet.  Continue IV insulin for an additional 2 hours.  Consult diabetes coordinator.  Emesis: Now resolved.  Likely related to DKA and marijuana use.  UDS positive for THC. Lipase normal and no significant elevation of LFTs.  Give antiemetic as needed and continue home PPI.  Mild AKI: Likely prerenal from dehydration.  BUN 21, creatinine 1.2.  Baseline creatinine 0.9.  Continue IV fluid hydration and monitor renal function.  Avoid nephrotoxic agents.  CAD status post CABG and PCI: Continue home aspirin, Plavix, statin  Hyperlipidemia: Continue home statin  Hypothyroidism: Continue home Synthroid  DVT prophylaxis: Lovenox Code Status: Full code Family Communication: No family available at this time.   Disposition Plan: Anticipate discharge after resolution of DKA and emesis. Consults called: Diabetes coordinator Admission status: It is my clinical opinion that admission to INPATIENT is reasonable and necessary because of the expectation that this patient will require hospital care that crosses at least 2 midnights to treat this condition based on the medical complexity of the problems presented.  Given the aforementioned information, the predictability of an adverse outcome is felt to be significant.  The medical decision making on this  patient  was of high complexity and the patient is at high risk for clinical deterioration, therefore this is a level 3 visit.  Shela Leff MD [facility]  If 7PM-7AM, please contact night-coverage [email or web address]  05/01/2019, 9:39 PM

## 2019-05-01 NOTE — ED Provider Notes (Signed)
[facility] EMERGENCY DEPARTMENT Provider Note   CSN: [id number] Arrival date & time: 05/01/19  1122     History Chief Complaint  Patient presents with  . Emesis  . Hyperglycemia    Jennifer Hogan is a 49 y.o. female.  HPI HPI Comments: Jennifer Hogan is a 49 y.o. female with a history of type 1 diabetes on insulin pump, DKA with most recent admission in February, anemia, thrombocytosis, HLD, MI, hypothyroidism who presents to the Emergency Department complaining of intractable vomiting for 2 days.  Patient states she received her second COVID-19 vaccination 4 days ago.  2 days ago she began vomiting but denies any nausea.  She states she becomes "extremely anxious when she thinks she has DKA".  She does endorse a history of anxiety which she takes hydroxyzine for but has been unable to hold down any of her meds since the onset of her symptoms.  In addition to her vomiting and anxiety she endorses fever, chills, diffuse upper abdominal pain, lightheadedness, fatigue.  She has been taking APAP for fever but, again, has been unable to hold down any medications.  She is followed by Dr. Renato Shin for her type 1 diabetes.  She has an insulin pump in place.  She denies diarrhea, constipation, acute chest pain, shortness of breath, visual changes, syncope, dizziness.    Past Medical History:  Diagnosis Date  . Anemia   . Anxiety   . Coronary artery disease    a. s/pPTCA DES x3 in the LAD (ostial DES placed, mid DES placed, distal DES placed)and PTCA/DES x1to intermediate branch 02/2017. b. ACS 02/08/18 with LCx stent placement.  . Depression   . Diabetes mellitus    type 1, on insulin pump  . DKA (diabetic ketoacidoses) ([place]) 12/31/2017  . Elevated platelet count   . High cholesterol   . Hypothyroidism   . Ischemic cardiomyopathy    a. EF low-normal with basal inferior akinesis, basal septal hypokinesis by echo 01/2018.    Patient Active Problem List   Diagnosis Date Noted  . Severe hyperglycemia due to diabetes mellitus ([place]) 03/15/2019  . Pleural effusion 12/09/2018  . Abdominal pain 11/28/2018  . Intractable nausea and vomiting 11/28/2018  . Diarrhea 11/28/2018  . DKA (diabetic ketoacidoses) ([place]) 11/25/2018  . Sinus tachycardia 11/24/2018  . Chronic systolic heart failure ([place])   . Cardiomyopathy ([place])   . Hypotension 10/29/2018  . DKA, type 1 ([place]) 05/24/2018  . Moderate episode of recurrent major depressive disorder ([place]) 03/27/2018  . Epigastric pain   . Hyperglycemia due to type 1 diabetes mellitus ([place]) 02/14/2018  . Hyperlipidemia LDL goal <70 02/14/2018  . S/P drug eluting coronary stent placement   . ACS (acute coronary syndrome) ([place]) 02/08/2018  . ST elevation myocardial infarction (STEMI) ([place]) 02/08/2018  . DKA (diabetic ketoacidosis) ([place]) 12/31/2017  . Marijuana abuse 12/31/2017  . Gastroparesis 12/06/2017  . Gastroparesis due to DM ([place]) 03/28/2017  . Coronary artery disease 03/28/2017  . Ischemic cardiomyopathy 03/28/2017  . Pure hypercholesterolemia   . NSTEMI (non-ST elevated myocardial infarction) ([place])   . Anemia 03/17/2017  . Allergic state 08/07/2015  . Vitiligo 07/09/2012  . Type 1 diabetes mellitus ([place]) 07/09/2012  . Depression 12/04/2010  . Non-intractable vomiting with nausea 12/04/2010  . Hypothyroidism 12/04/2010    Past Surgical History:  Procedure Laterality Date  . APPENDECTOMY    . CORONARY ARTERY BYPASS GRAFT N/A 11/04/2018   Procedure: CORONARY ARTERY BYPASS GRAFTING (CABG), ON PUMP,  TIMES THREE, USING LEFT AND RIGHT INTERNAL MAMMARY ARTERIES;  Surgeon: Wonda Olds, MD;  Location: [facility];  Service: Open Heart Surgery;  Laterality: N/A;  . CORONARY STENT INTERVENTION N/A 03/20/2017   Procedure: DES x 3 LAD, DES OM; Surgeon: Burnell Blanks, MD;  Location: [facility] CV LAB;  Service: Cardiovascular;  Laterality: N/A;  . CORONARY/GRAFT ACUTE MI REVASCULARIZATION N/A  02/08/2018   Procedure: Coronary/Graft Acute MI Revascularization;  Surgeon: Belva Crome, MD;  Location: [facility];  Service: Cardiovascular;  Laterality: N/A;  . LEFT HEART CATH AND CORONARY ANGIOGRAPHY N/A 03/20/2017   Procedure: LEFT HEART CATH AND CORONARY ANGIOGRAPHY;  Surgeon: Burnell Blanks, MD;  Location: [facility];  Service: Cardiovascular;  Laterality: N/A;  . LEFT HEART CATH AND CORONARY ANGIOGRAPHY N/A 02/08/2018   Procedure: LEFT HEART CATH AND CORONARY ANGIOGRAPHY;  Surgeon: Belva Crome, MD;  Location: [facility];  Service: Cardiovascular;  Laterality: N/A;  . LEFT HEART CATH AND CORONARY ANGIOGRAPHY N/A 10/29/2018   Procedure: LEFT HEART CATH AND CORONARY ANGIOGRAPHY;  Surgeon: Burnell Blanks, MD;  Location: [facility] LAB;  Service: Cardiovascular;  Laterality: N/A;  . TEE WITHOUT CARDIOVERSION N/A 11/04/2018   Procedure: TRANSESOPHAGEAL ECHOCARDIOGRAM (TEE);  Surgeon: Wonda Olds, MD;  Location: [facility];  Service: Open Heart Surgery;  Laterality: N/A;     OB History    Gravida  0   Para  0   Term  0   Preterm  0   AB  0   Living  0     SAB  0   TAB  0   Ectopic  0   Multiple  0   Live Births  0           Family History  Problem Relation Age of Onset  . Coronary artery disease Father   . Congestive Heart Failure Father   . Asthma Father   . Cancer Mother        T cell lymphoma    Social History   Tobacco Use  . Smoking status: Never Smoker  . Smokeless tobacco: Never Used  . Tobacco comment: Use marijuana  Substance Use Topics  . Alcohol use: No    Alcohol/week: 0.0 standard drinks  . Drug use: Not Currently    Types: Marijuana    Comment: last use last week, occaisonal marijuana use    Home Medications Prior to Admission medications   Medication Sig Start Date End Date Taking? Authorizing Provider  albuterol (VENTOLIN HFA) 108 (90 Base) MCG/ACT inhaler Inhale 2 puffs into the  lungs every 6 (six) hours as needed for wheezing or shortness of breath. 03/19/19 04/18/19  [name], NP  aspirin 81 MG EC tablet Take 1 tablet (81 mg total) by mouth daily. 12/07/17   Amin, Jeanella Flattery, MD  atorvastatin (LIPITOR) 20 MG tablet Take 1 tablet (20 mg total) by mouth daily. 02/19/19 02/19/20  [name], MD  clopidogrel (PLAVIX) 75 MG tablet Take 1 tablet (75 mg total) by mouth daily. 03/05/19   [name], MD  diclofenac Sodium (VOLTAREN) 1 % GEL Apply 2 g topically 4 (four) times daily. 03/19/19   [name], NP  HUMALOG 100 UNIT/ML injection 35 UNITS PER INSULIN PUMP DAILY; MUST KEEP APPT FOR FUTURE REFILLS TO BE AUTHORIZED 04/21/19   Renato Shin, MD  hydrOXYzine (ATARAX/VISTARIL) 25 MG tablet Take 1 tablet (25 mg total) by mouth  3 (three) times daily as needed. 03/19/19   [name], NP  insulin aspart (NOVOLOG) 100 UNIT/ML injection 35 units per insulin pump daily; MUST KEEP APPT FOR FUTURE REFILLS TO BE AUTHORIZED 03/24/19   Renato Shin, MD  ipratropium (ATROVENT) 0.06 % nasal spray Place 2 sprays into both nostrils 2 (two) times daily as needed for rhinitis.    [provider]  isosorbide mononitrate (IMDUR) 30 MG 24 hr tablet Take 0.5 tablets (15 mg total) by mouth daily. 01/08/19   [name], MD  levothyroxine (EUTHYROX) 100 MCG tablet Take 1 tablet (100 mcg total) by mouth daily before breakfast. Must have office visit for refills 12/17/18   Freeman Caldron M, PA-C  metoCLOPramide (REGLAN) 5 MG tablet Take 1 tablet (5 mg total) by mouth every 12 (twelve) hours as needed for nausea or vomiting. 12/17/18 12/17/19  Argentina Donovan, PA-C  montelukast (SINGULAIR) 10 MG tablet Take 1 tablet (10 mg total) by mouth at bedtime. 03/19/19 06/17/19  [name], NP  nitroGLYCERIN (NITROSTAT) 0.4 MG SL tablet Place 1 tablet (0.4 mg total) under the tongue every 5 (five) minutes as needed for chest pain. 01/08/19   [name], MD  ondansetron  (ZOFRAN ODT) 4 MG disintegrating tablet Take 1 tablet (4 mg total) by mouth every 8 (eight) hours as needed for nausea or vomiting. 12/17/18   Argentina Donovan, PA-C  pantoprazole (PROTONIX) 40 MG tablet Take 40 mg by mouth 2 (two) times daily as needed (acid reflux).     [provider]  QUEtiapine (SEROQUEL) 50 MG tablet Take 1 tablet (50 mg total) by mouth at bedtime. May increase to 2 tablets or 100 mg at bedtime after 2 weeks if needed 03/19/19 06/17/19  [name], NP  Simethicone (PHAZYME PO) Take 1 tablet by mouth as needed (upset stomach).    [provider]  sucralfate (CARAFATE) 1 GM/10ML suspension Take 1 g by mouth as needed.    [provider]    Allergies    Atorvastatin, Crestor [rosuvastatin calcium], Peanut-containing drug products, Zetia [ezetimibe], Ciprofloxin hcl [ciprofloxacin], and Food color red [red dye]  Review of Systems   Review of Systems  All other systems reviewed and are negative. Ten systems reviewed and are negative for acute change, except as noted in the HPI.   Physical Exam Updated Vital Signs BP (!) 152/97 (BP Location: Left Arm)   Pulse (!) 124   Temp 98.1 F (36.7 C) (Oral)   Resp 19   Ht 5\' 5"  (1.651 m)   Wt 68 kg   LMP 04/23/2019   SpO2 100%   BMI 24.96 kg/m   Physical Exam Vitals and nursing note reviewed.  Constitutional:      General: She is in acute distress.     Appearance: Normal appearance. She is not ill-appearing, toxic-appearing or diaphoretic.     Comments: Patient is a well-developed extremely anxious appearing adult female.  She speaks clearly and coherently.  HENT:     Head: Normocephalic and atraumatic.     Right Ear: External ear normal.     Left Ear: External ear normal.     Nose: Nose normal.     Mouth/Throat:     Mouth: Mucous membranes are dry.     Pharynx: No oropharyngeal exudate or posterior oropharyngeal erythema.     Comments: Dry mucous membranes Eyes:     General: No  scleral icterus.       Right  eye: No discharge.        Left eye: No discharge.     Extraocular Movements: Extraocular movements intact.     Conjunctiva/sclera: Conjunctivae normal.     Pupils: Pupils are equal, round, and reactive to light.  Cardiovascular:     Rate and Rhythm: Tachycardia present.     Pulses: Normal pulses.     Heart sounds: Murmur present. No friction rub. No gallop.      Comments: Patient is tachycardic in the 130s.  Systolic murmur appreciated. Pulmonary:     Effort: Pulmonary effort is normal. No respiratory distress.     Breath sounds: Normal breath sounds. No stridor. No wheezing, rhonchi or rales.  Abdominal:     General: Abdomen is flat. There is no distension.     Palpations: Abdomen is soft.     Tenderness: There is abdominal tenderness. There is no guarding or rebound.     Comments: Diffuse right upper quadrant and epigastric tenderness appreciated with deep palpation.  Abdomen is soft.  No rebound.  No guarding.  Musculoskeletal:        General: Normal range of motion.     Cervical back: Normal range of motion.  Skin:    General: Skin is warm and dry.     Capillary Refill: Capillary refill takes less than 2 seconds.  Neurological:     General: No focal deficit present.     Mental Status: She is alert and oriented to person, place, and time.  Psychiatric:        Mood and Affect: Mood is anxious.        Speech: Speech is rapid and pressured.        Behavior: Behavior is hyperactive.    ED Results / Procedures / Treatments   Labs (all labs ordered are listed, but only abnormal results are displayed) Labs Reviewed  BASIC METABOLIC PANEL - Abnormal; Notable for the following components:      Result Value   Sodium 133 (*)    Potassium 3.4 (*)    CO2 17 (*)    Glucose, Bld 354 (*)    BUN 21 (*)    Creatinine, Ser 1.24 (*)    GFR calc non Af Amer 51 (*)    GFR calc Af Amer 59 (*)    Anion gap 17 (*)    All other components within normal limits    CBC - Abnormal; Notable for the following components:   WBC 14.4 (*)    RDW 15.9 (*)    Platelets 502 (*)    All other components within normal limits  URINALYSIS, ROUTINE W REFLEX MICROSCOPIC - Abnormal; Notable for the following components:   APPearance HAZY (*)    Glucose, UA >=500 (*)    Hgb urine dipstick MODERATE (*)    Ketones, ur 80 (*)    Protein, ur 100 (*)    Bacteria, UA RARE (*)    All other components within normal limits  RAPID URINE DRUG SCREEN, HOSP PERFORMED - Abnormal; Notable for the following components:   Tetrahydrocannabinol POSITIVE (*)    All other components within normal limits  HEPATIC FUNCTION PANEL - Abnormal; Notable for the following components:   Total Protein 8.6 (*)    AST 48 (*)    All other components within normal limits  BETA-HYDROXYBUTYRIC ACID - Abnormal; Notable for the following components:   Beta-Hydroxybutyric Acid 2.26 (*)    All other components within normal limits  CBG MONITORING, ED -  Abnormal; Notable for the following components:   Glucose-Capillary 336 (*)    All other components within normal limits  CBG MONITORING, ED - Abnormal; Notable for the following components:   Glucose-Capillary 352 (*)    All other components within normal limits  CBG MONITORING, ED - Abnormal; Notable for the following components:   Glucose-Capillary 200 (*)    All other components within normal limits  CBG MONITORING, ED - Abnormal; Notable for the following components:   Glucose-Capillary 129 (*)    All other components within normal limits  CBG MONITORING, ED - Abnormal; Notable for the following components:   Glucose-Capillary 113 (*)    All other components within normal limits  CBG MONITORING, ED - Abnormal; Notable for the following components:   Glucose-Capillary 125 (*)    All other components within normal limits  SARS CORONAVIRUS 2 (TAT 6-24 HRS)  LIPASE, BLOOD  BETA-HYDROXYBUTYRIC ACID  CBC  BASIC METABOLIC PANEL  BASIC  METABOLIC PANEL  I-STAT BETA HCG BLOOD, ED ([facility], [facility], [facility] ONLY)    EKG EKG Interpretation  Date/Time:  Thursday May 01 2019 18:05:18 EDT Ventricular Rate:  125 PR Interval:    QRS Duration: 84 QT Interval:  317 QTC Calculation: 458 R Axis:   90 Text Interpretation: Sinus tachycardia Ventricular premature complex Consider right atrial enlargement Borderline right axis deviation Consider left ventricular hypertrophy Borderline T abnormalities, inferior leads Since last tracing rate faster Confirmed by Wandra Arthurs [id number]) on 05/01/2019 6:08:12 PM  Radiology No results found.  Procedures Procedures   Medications Ordered in ED Medications  insulin regular, human (MYXREDLIN) 100 units/ 100 mL infusion (3.6 Units/hr Intravenous New Bag/Given 05/01/19 1900)  0.9 %  sodium chloride infusion (has no administration in time range)  dextrose 5 %-0.45 % sodium chloride infusion ( Intravenous New Bag/Given 05/01/19 1919)  dextrose 50 % solution 0-50 mL (has no administration in time range)  potassium chloride 10 mEq in 100 mL IVPB (10 mEq Intravenous New Bag/Given 05/01/19 1945)  LORazepam (ATIVAN) injection 0.5 mg (has no administration in time range)  sodium chloride 0.9 % bolus 1,000 mL (1,000 mLs Intravenous New Bag/Given 05/01/19 1946)  LORazepam (ATIVAN) tablet 0.5 mg (0.5 mg Oral Given 05/01/19 1845)    ED Course  I have reviewed the triage vital signs and the nursing notes.  Pertinent labs & imaging results that were available during my care of the patient were reviewed by me and considered in my medical decision making (see chart for details).  Clinical Course as of May 01 2151  Thu May 01, 2019  1710 Up from 0.91, 16-month ago  Creatinine(!): 1.24 [LJ]  1909 Anion gap(!): 17 [LJ]  1942 Glucose(!): 354 [LJ]  1943 Glucose-Capillary(!): 200 [LJ]  1943 Beta-Hydroxybutyric Acid(!): 2.26 [LJ]  2028 Glucose-Capillary(!): 113 [LJ]    Clinical Course User Index [LJ] Rayna Sexton, PA-C    MDM Rules/Calculators/A&P                       6:22 PM patient is an anxious 49 year old female with a significant medical history including but not limited to myocardial infarction, type 1 diabetes, DKA with her most recent admission on February 20 of this year.  She endorses a history of anxiety and appears visibly anxious.  She is tachycardic, tachypneic and hypertensive.  Physical exam significant for dry mucous membranes, epigastric and right upper quadrant pain, tachycardia.  She is hyperglycemic at 354.  Anion gap of 17.  Creatinine elevated at 1.24, think this is likely secondary to her hyperglycemia and dehydration.  Sodium correction for hyperglycemia is 137.  Will order additional labs, chest x-ray, ECG.  Patient started on fluids and Endo tool.  We will closely monitor.   7:49 PM beta hydroxybutyrate is 2.26.  Urine is positive for THC.  CBG is trending downwards towards 200.  I spoke to patient she states that she overall feels better but still feels extremely anxious.  She feels that the p.o. Ativan did not help.  We will give another 0.5 mg of Ativan IV.  Will discuss with hospitalist for admission.  9:51 PM patient was admitted to [facility] hospitalist will assume care for patient.  Final Clinical Impression(s) / ED Diagnoses Final diagnoses:  Diabetic ketoacidosis without coma associated with type 1 diabetes mellitus [facility])    Rx / DC Orders ED Discharge Orders    None       Rayna Sexton, PA-C 05/01/19 2155    Drenda Freeze, MD 05/02/19 [phone]

## 2019-05-01 NOTE — ED Triage Notes (Signed)
Pt reports emesis since Tuesday, pt has insulin pump and denies any issues with pump. Pt states "I think I am in DKA" Pt cannot sit sit in triage.

## 2019-05-02 ENCOUNTER — Encounter: Payer: Self-pay | Admitting: Nurse Practitioner

## 2019-05-02 LAB — CBC
HCT: 30.1 % — ABNORMAL LOW (ref 36.0–46.0)
HCT: 28.6 % — ABNORMAL LOW (ref 36.0–46.0)
Hemoglobin: 9.7 g/dL — ABNORMAL LOW (ref 12.0–15.0)
Hemoglobin: 9.1 g/dL — ABNORMAL LOW (ref 12.0–15.0)
MCH: 28.3 pg (ref 26.0–34.0)
MCH: 28.3 pg (ref 26.0–34.0)
MCHC: 32.2 g/dL (ref 30.0–36.0)
MCHC: 31.8 g/dL (ref 30.0–36.0)
MCV: 87.8 fL (ref 80.0–100.0)
MCV: 88.8 fL (ref 80.0–100.0)
Platelets: 373 10*3/uL (ref 150–400)
Platelets: 353 10*3/uL (ref 150–400)
RBC: 3.43 MIL/uL — ABNORMAL LOW (ref 3.87–5.11)
RBC: 3.22 MIL/uL — ABNORMAL LOW (ref 3.87–5.11)
RDW: 15.9 % — ABNORMAL HIGH (ref 11.5–15.5)
RDW: 16 % — ABNORMAL HIGH (ref 11.5–15.5)
WBC: 11.7 10*3/uL — ABNORMAL HIGH (ref 4.0–10.5)
WBC: 9.6 10*3/uL (ref 4.0–10.5)
nRBC: 0 % (ref 0.0–0.2)
nRBC: 0 % (ref 0.0–0.2)

## 2019-05-02 LAB — BASIC METABOLIC PANEL
Anion gap: 12 (ref 5–15)
Anion gap: 11 (ref 5–15)
BUN: 16 mg/dL (ref 6–20)
BUN: 13 mg/dL (ref 6–20)
CO2: 16 mmol/L — ABNORMAL LOW (ref 22–32)
CO2: 17 mmol/L — ABNORMAL LOW (ref 22–32)
Calcium: 8.1 mg/dL — ABNORMAL LOW (ref 8.9–10.3)
Calcium: 8.1 mg/dL — ABNORMAL LOW (ref 8.9–10.3)
Chloride: 108 mmol/L (ref 98–111)
Chloride: 110 mmol/L (ref 98–111)
Creatinine, Ser: 0.93 mg/dL (ref 0.44–1.00)
Creatinine, Ser: 0.83 mg/dL (ref 0.44–1.00)
GFR calc Af Amer: >60 mL/min (ref >60)
GFR calc Af Amer: >60 mL/min (ref >60)
GFR calc non Af Amer: >60 mL/min (ref >60)
GFR calc non Af Amer: >60 mL/min (ref >60)
Glucose, Bld: 195 mg/dL — ABNORMAL HIGH (ref 70–99)
Glucose, Bld: 173 mg/dL — ABNORMAL HIGH (ref 70–99)
Potassium: 3.9 mmol/L (ref 3.5–5.1)
Potassium: 3.8 mmol/L (ref 3.5–5.1)
Sodium: 136 mmol/L (ref 135–145)
Sodium: 138 mmol/L (ref 135–145)

## 2019-05-02 LAB — CBG MONITORING, ED
Glucose-Capillary: 169 mg/dL — ABNORMAL HIGH (ref 70–99)
Glucose-Capillary: 154 mg/dL — ABNORMAL HIGH (ref 70–99)
Glucose-Capillary: 144 mg/dL — ABNORMAL HIGH (ref 70–99)
Glucose-Capillary: 124 mg/dL — ABNORMAL HIGH (ref 70–99)
Glucose-Capillary: 205 mg/dL — ABNORMAL HIGH (ref 70–99)
Glucose-Capillary: 227 mg/dL — ABNORMAL HIGH (ref 70–99)
Glucose-Capillary: 166 mg/dL — ABNORMAL HIGH (ref 70–99)
Glucose-Capillary: 205 mg/dL — ABNORMAL HIGH (ref 70–99)

## 2019-05-02 LAB — SARS CORONAVIRUS 2 (TAT 6-24 HRS): SARS Coronavirus 2: NEGATIVE

## 2019-05-02 LAB — BETA-HYDROXYBUTYRIC ACID: Beta-Hydroxybutyric Acid: 1.41 mmol/L — ABNORMAL HIGH (ref 0.05–0.27)

## 2019-05-02 MED ORDER — SODIUM CHLORIDE 0.9 % IV SOLN: INTRAVENOUS | Status: DC

## 2019-05-02 MED ORDER — INSULIN PUMP
SUBCUTANEOUS | Status: DC
  Administered 2019-05-02: 1 via SUBCUTANEOUS
  Filled 2019-05-02: qty 1

## 2019-05-02 NOTE — ED Notes (Signed)
Report attempted 

## 2019-05-02 NOTE — Progress Notes (Signed)
[place] to be D/C'd per MD order. Discussed with the patient and all questions fully answered. ? VSS, Skin clean, dry and intact without evidence of skin break down, no evidence of skin tears noted. ? IV catheter discontinued intact times 2. Site without signs and symptoms of complications. Dressing and pressure applied. ? An After Visit Summary was printed and given to the patient. Patient informed where to pickup prescriptions. ? D/c education completed with patient/family including follow up instructions, medication list, d/c activities limitations if indicated, with other d/c instructions as indicated by MD - patient able to verbalize understanding, all questions fully answered.  ? Patient instructed to return to ED, call 911, or call MD for any changes in condition.  ? Patient to be escorted via [place], and D/C home via private auto.

## 2019-05-02 NOTE — Progress Notes (Addendum)
Inpatient Diabetes Program Recommendations  AACE/ADA: New Consensus Statement on Inpatient Glycemic Control (2015)  Target Ranges:  Prepandial:   less than 140 mg/dL      Peak postprandial:   less than 180 mg/dL (1-2 hours)      Critically ill patients:  140 - 180 mg/dL   Lab Results  Component Value Date   GLUCAP 227 (H) 05/02/2019   HGBA1C 9.7 (H) 03/15/2019    Review of Glycemic Control  Diabetes history: DM 1 Outpatient Diabetes medications: Medtronic insulin pump 770 model Current orders for Inpatient glycemic control:  IV insulin  Inpatient Diabetes Program Recommendations:    Spoke with pt at bedside. Pt has Endocrinologist, Dr. Loanne Drilling, last visit approx 1 month ago. Pt has a new pump site already in place in her left slight upper quadrant. Pt to use this site when she places her insulin pump back on to her normal settings. Pt to over lap insulin pump with IV insulin for 1 hour.  Secure chat with Dr. Posey Pronto about plan of care. CO2 improving almost at goal. Will verify if IV insulin needs to be turned off at 1300 pm or MD is waiting on BMET results to determine transition.  Discussed plan of care with Dr Posey Pronto will do 1 hour transition with pt insulin pump.  Thanks,  Tama Headings RN, MSN, BC-ADM Inpatient Diabetes Coordinator Team Pager [phone] (8a-5p)

## 2019-05-02 NOTE — ED Notes (Signed)
Per off going RN insulin drip to be d/c at 1300 and bed assignment will change to [facility] at that time.

## 2019-05-04 ENCOUNTER — Other Ambulatory Visit: Payer: Self-pay | Admitting: Nurse Practitioner

## 2019-05-04 DIAGNOSIS — F54 Psychological and behavioral factors associated with disorders or diseases classified elsewhere: Secondary | ICD-10-CM

## 2019-05-05 ENCOUNTER — Encounter: Payer: Self-pay | Admitting: Endocrinology

## 2019-05-05 ENCOUNTER — Telehealth: Payer: Self-pay

## 2019-05-05 NOTE — Telephone Encounter (Signed)
Transition Care Management Follow-up Telephone Call Date of discharge and from where:05/02/2019, [facility]   Call placed to patient # [phone], message left with call back requested to this CM.   Patient has appointment with Ms Raul Del, NP 06/03/2019

## 2019-05-06 ENCOUNTER — Telehealth: Payer: Self-pay

## 2019-05-06 ENCOUNTER — Other Ambulatory Visit: Payer: Self-pay | Admitting: Nurse Practitioner

## 2019-05-06 ENCOUNTER — Telehealth: Payer: MEDICAID | Admitting: Emergency Medicine

## 2019-05-06 ENCOUNTER — Encounter: Payer: Self-pay | Admitting: Nurse Practitioner

## 2019-05-06 DIAGNOSIS — J329 Chronic sinusitis, unspecified: Secondary | ICD-10-CM

## 2019-05-06 MED ORDER — FLUOXETINE HCL 40 MG PO CAPS: 40.0000 mg | ORAL_CAPSULE | Freq: Every day | ORAL | 3 refills | Status: DC

## 2019-05-06 MED ORDER — AMOXICILLIN-POT CLAVULANATE 875-125 MG PO TABS: 1.0000 | ORAL_TABLET | Freq: Two times a day (BID) | ORAL | 0 refills | Status: DC

## 2019-05-06 NOTE — Progress Notes (Signed)
We are sorry that you are not feeling well.  Here is how we plan to help!  Based on what you have shared with me it looks like you have sinusitis.  Sinusitis is inflammation and infection in the sinus cavities of the head.  Based on your presentation I believe you most likely have Acute Bacterial Sinusitis.  This is an infection caused by bacteria and is treated with antibiotics. I have prescribed Augmentin 875mg/125mg one tablet twice daily with food, for 7 days. You may use an oral decongestant such as Mucinex D or if you have glaucoma or high blood pressure use plain Mucinex. Saline nasal spray help and can safely be used as often as needed for congestion.  If you develop worsening sinus pain, fever or notice severe headache and vision changes, or if symptoms are not better after completion of antibiotic, please schedule an appointment with a health care provider.    Sinus infections are not as easily transmitted as other respiratory infection, however we still recommend that you avoid close contact with loved ones, especially the very young and elderly.  Remember to wash your hands thoroughly throughout the day as this is the number one way to prevent the spread of infection!  Home Care:  Only take medications as instructed by your medical team.  Complete the entire course of an antibiotic.  Do not take these medications with alcohol.  A steam or ultrasonic humidifier can help congestion.  You can place a towel over your head and breathe in the steam from hot water coming from a faucet.  Avoid close contacts especially the very young and the elderly.  Cover your mouth when you cough or sneeze.  Always remember to wash your hands.  Get Help Right Away If:  You develop worsening fever or sinus pain.  You develop a severe head ache or visual changes.  Your symptoms persist after you have completed your treatment plan.  Make sure you  Understand these instructions.  Will watch your  condition.  Will get help right away if you are not doing well or get worse.  Your e-visit answers were reviewed by a board certified advanced clinical practitioner to complete your personal care plan.  Depending on the condition, your plan could have included both over the counter or prescription medications.  If there is a problem please reply  once you have received a response from your provider.  Your safety is important to us.  If you have drug allergies check your prescription carefully.    You can use MyChart to ask questions about today's visit, request a non-urgent call back, or ask for a work or school excuse for 24 hours related to this e-Visit. If it has been greater than 24 hours you will need to follow up with your provider, or enter a new e-Visit to address those concerns.  You will get an e-mail in the next two days asking about your experience.  I hope that your e-visit has been valuable and will speed your recovery. Thank you for using e-visits.   Approximately 5 minutes was used in reviewing the patient's chart, questionnaire, prescribing medications, and documentation.  

## 2019-05-06 NOTE — Telephone Encounter (Signed)
Transition Care Management Follow-up Telephone Call Attempt #2   Date of discharge and from where:05/02/2019, [facility]   Call placed to patient # [phone], message left with call back requested to this CM.   Patient has appointment with Ms Raul Del, NP 06/03/2019

## 2019-05-06 NOTE — Telephone Encounter (Signed)
Transition Care Management Follow-up Telephone Call  Date of discharge and from where: 05/02/2019, [facility]   How have you been since you were released from the hospital? She explained that she had an e-visit this morning and was diagnosed with a sinus infection. She had been experiencing a low grade fever, thick nasal discharge, facial pain, throat pain.  She said she has taken the new antibiotic and is resting.   Any questions or concerns?  concern noted above.  She also wanted to schedule another appointment with [name] See, LCSW. She is pleased with the support that [facility] provides.   Appointment scheduled for 05/15/2019.   She had requested a refill of fluoxetine for her anxiety.  Informed her Ms Raul Del, NP has placed the order.   Items Reviewed:  Did the pt receive and understand the discharge instructions provided? yes  Medications obtained and verified? she said she has all medications, no questions at this time. No new meds.   Any new allergies since your discharge?  none reported   Do you have support at home? lives alone.  Has support from 2  long time friends  Other (ie: DME, Home Health, etc) no home health or DME ordered.  Has glucometer and insulin pump. She said her blood sugar is running around 200.   Functional Questionnaire: (I = Independent and D = Dependent) ADL's: independent   Follow up appointments reviewed:    PCP Hospital f/u appt confirmed? .has appointment with Ms Raul Del, NP on 06/03/2019.  She will call if she wants to be seen sooner  [facility] f/u appt confirmed? Endocrinology-  05/07/2019  Are transportation arrangements needed?  no, not at this time, per patient  If their condition worsens, is the pt aware to call  their PCP or go to the ED?   yes  Was the patient provided with contact information for the PCP's office or ED?  She has the phone number for the clinic  Was the pt encouraged to call back with questions or  concerns?  yes

## 2019-05-07 ENCOUNTER — Telehealth: Payer: Self-pay | Admitting: Endocrinology

## 2019-05-07 NOTE — Discharge Summary (Signed)
[facility] Discharge Summary   Patient: Jennifer Hogan [id number]  PCP: [name], NP  Date of admission: 05/01/2019   Date of discharge: 05/02/2019      Discharge Diagnoses:  Principal Problem:   DKA (diabetic ketoacidoses) ([place]) Active Problems:   Hypothyroidism   Type 1 diabetes mellitus ([place])   Emesis   AKI (acute kidney injury) ([place])   Admitted From: home Disposition:  Home   Recommendations for Outpatient Follow-up:  1. PCP: follow up with PCP in week 2. Follow up LABS/TEST:  none  Follow-up Information    [name], NP. Schedule an appointment as soon as possible for a visit in 1 week(s).   Specialty: Nurse Practitioner Contact information: [place] [id number] [phone]          Diet recommendation: Carb modified diet  Activity: The patient is advised to gradually reintroduce usual activities, as tolerated  Discharge Condition: stable  Code Status: Full code   History of present illness: As per the H and P dictated on admission, " Jennifer Hogan is a 49 y.o. female with medical history significant of type 1 diabetes on insulin pump, CAD status post CABG and PCI, hyperlipidemia, hypothyroidism, anemia, anxiety, depression presenting to the ED with complaints of emesis.  Patient states she has anxiety for which she takes hydroxyzine and uses marijuana.  She has cut down on her marijuana use and has not used it for the past 1 week.  2 days ago at dinner she started feeling nauseous and soon after vomited.  She has continued to vomit for the past 2 days.  Her blood glucose has been elevated, above 300.  States she has been having spasms in her epigastric region which she feels are related to her vagus nerve.  States after receiving Ativan in the ED her tachycardia and epigastric spasms have resolved.  She is no longer nauseous.  She received her second Covid vaccine recently.  No cough or shortness of breath.  Her  endocrinologist is Dr. Loanne Drilling.  She has an insulin pump in place."  Hospital Course:   Summary of her active problems in the hospital is as following. DKA in the setting of uncontrolled type 1 diabetes on insulin pump: Blood glucose 354.  Bicarb 17, anion gap 17.  UA with ketones.  Beta hydroxybutyrate acid elevated at 2.26.  Last A1c 9.7 on 03/15/2019.  Mild leukocytosis is likely reactive.  No infectious etiology identified. DKA resolved and pt was able to tolerate oral diet.   Emesis: Now resolved.  Likely related to DKA and marijuana use.  UDS positive for THC. Lipase normal and no significant elevation of LFTs.  Give antiemetic as needed and continue home PPI.  Mild AKI: Likely prerenal from dehydration.  BUN 21, creatinine 1.2.  Baseline creatinine 0.9. Avoid nephrotoxic agents.  CAD status post CABG and PCI: Continue home aspirin, Plavix, statin  Hyperlipidemia: Continue home statin  Hypothyroidism: Continue home Synthroid  Patient was ambulatory without any assistance. On the day of the discharge the patient's vitals were stable, and no other acute medical condition were reported by patient. the patient was felt safe to be discharge at Home with no therapy needed on discharge.  Consultants: none Procedures: none  Discharge Exam: General: Appear in no distress, no Rash; Oral Mucosa Clear, moist. Cardiovascular: S1 and S2 Present, no Murmur, Respiratory: normal respiratory effort, Bilateral Air entry present and no Crackles, no wheezes Abdomen: Bowel Sound present, Soft  and no tenderness, no hernia Extremities: no Pedal edema, no calf tenderness Neurology: alert and oriented to time, place, and person affect appropriate.  Filed Weights   05/01/19 1131 05/01/19 1134  Weight: 68 kg 68 kg   Vitals:   05/02/19 1530 05/02/19 1625  BP: 115/77 117/68  Pulse: 84 70  Resp: 13 16  Temp:  98.3 F (36.8 C)  SpO2: 100% 100%    DISCHARGE MEDICATION: Allergies as of  05/02/2019      Reactions   Atorvastatin    Myalgias on 80mg  dose, tolerates 20mg  ok   Crestor [rosuvastatin Calcium] Other (See Comments)   Severe myalgias and joint pain. Has tolerated atorvastatin though   Peanut-containing Drug Products Other (See Comments)   Vomiting, upset stomach and some wheezing   Zetia [ezetimibe]    myalgias   Ciprofloxin Hcl [ciprofloxacin] Nausea And Vomiting, Other (See Comments)   Severe migraine   Food Color Red [red Dye] Diarrhea      Medication List    TAKE these medications   albuterol 108 (90 Base) MCG/ACT inhaler Commonly known as: VENTOLIN HFA Inhale 2 puffs into the lungs every 6 (six) hours as needed for wheezing or shortness of breath.   aspirin 81 MG EC tablet Take 1 tablet (81 mg total) by mouth daily.   atorvastatin 20 MG tablet Commonly known as: LIPITOR Take 1 tablet (20 mg total) by mouth daily.   clopidogrel 75 MG tablet Commonly known as: PLAVIX Take 1 tablet (75 mg total) by mouth daily.   diclofenac Sodium 1 % Gel Commonly known as: Voltaren Apply 2 g topically 4 (four) times daily. What changed:   when to take this  reasons to take this   HumaLOG 100 UNIT/ML injection Generic drug: insulin lispro 35 UNITS PER INSULIN PUMP DAILY; MUST KEEP APPT FOR FUTURE REFILLS TO BE AUTHORIZED What changed: See the new instructions.   hydrOXYzine 25 MG tablet Commonly known as: ATARAX/VISTARIL Take 1 tablet (25 mg total) by mouth 3 (three) times daily as needed. What changed: reasons to take this   insulin aspart 100 UNIT/ML injection Commonly known as: novoLOG 35 units per insulin pump daily; MUST KEEP APPT FOR FUTURE REFILLS TO BE AUTHORIZED   ipratropium 0.06 % nasal spray Commonly known as: ATROVENT Place 2 sprays into both nostrils 2 (two) times daily as needed for rhinitis.   isosorbide mononitrate 30 MG 24 hr tablet Commonly known as: IMDUR Take 0.5 tablets (15 mg total) by mouth daily.   levothyroxine 100 MCG  tablet Commonly known as: Euthyrox Take 1 tablet (100 mcg total) by mouth daily before breakfast. Must have office visit for refills   metoCLOPramide 5 MG tablet Commonly known as: Reglan Take 1 tablet (5 mg total) by mouth every 12 (twelve) hours as needed for nausea or vomiting.   montelukast 10 MG tablet Commonly known as: SINGULAIR Take 1 tablet (10 mg total) by mouth at bedtime.   nitroGLYCERIN 0.4 MG SL tablet Commonly known as: NITROSTAT Place 1 tablet (0.4 mg total) under the tongue every 5 (five) minutes as needed for chest pain.   ondansetron 4 MG disintegrating tablet Commonly known as: Zofran ODT Take 1 tablet (4 mg total) by mouth every 8 (eight) hours as needed for nausea or vomiting.   pantoprazole 40 MG tablet Commonly known as: PROTONIX Take 40 mg by mouth 2 (two) times daily as needed (acid reflux).   QUEtiapine 50 MG tablet Commonly known as: SEROquel Take 1 tablet (  50 mg total) by mouth at bedtime. May increase to 2 tablets or 100 mg at bedtime after 2 weeks if needed      Allergies  Allergen Reactions  . Atorvastatin     Myalgias on 80mg  dose, tolerates 20mg  ok  . Crestor [Rosuvastatin Calcium] Other (See Comments)    Severe myalgias and joint pain. Has tolerated atorvastatin though  . Peanut-Containing Drug Products Other (See Comments)    Vomiting, upset stomach and some wheezing  . Zetia [Ezetimibe]     myalgias  . Ciprofloxin Hcl [Ciprofloxacin] Nausea And Vomiting and Other (See Comments)    Severe migraine  . Food Color Red [Red Dye] Diarrhea   Discharge Instructions    Diet - low sodium heart healthy   Complete by: As directed    Increase activity slowly   Complete by: As directed       The results of significant diagnostics from this hospitalization (including imaging, microbiology, ancillary and laboratory) are listed below for reference.    Significant Diagnostic Studies: DG Chest Portable 1 View  Result Date: 05/01/2019 CLINICAL  DATA:  DKA EXAM: PORTABLE CHEST 1 VIEW COMPARISON:  12/09/2018 FINDINGS: Prior CABG. Heart and mediastinal contours are within normal limits. No focal opacities or effusions. No acute bony abnormality. IMPRESSION: No active cardiopulmonary disease. Electronically Signed   By: Rolm Baptise M.D.   On: 05/01/2019 19:00    Microbiology: Recent Results (from the past 240 hour(s))  SARS CORONAVIRUS 2 (TAT 6-24 HRS) Nasopharyngeal Nasopharyngeal Swab     Status: None   Collection Time: 05/01/19  8:58 PM   Specimen: Nasopharyngeal Swab  Result Value Ref Range Status   SARS Coronavirus 2 NEGATIVE NEGATIVE Final    Comment: (NOTE) SARS-CoV-2 target nucleic acids are NOT DETECTED. The SARS-CoV-2 RNA is generally detectable in upper and lower respiratory specimens during the acute phase of infection. Negative results do not preclude SARS-CoV-2 infection, do not rule out co-infections with other pathogens, and should not be used as the sole basis for treatment or other patient management decisions. Negative results must be combined with clinical observations, patient history, and epidemiological information. The expected result is Negative. Fact Sheet for Patients: SugarRoll.be Fact Sheet for Healthcare Providers: https://www.woods-mathews.com/ This test is not yet approved or cleared by the [place] FDA and  has been authorized for detection and/or diagnosis of SARS-CoV-2 by FDA under an Emergency Use Authorization (EUA). This EUA will remain  in effect (meaning this test can be used) for the duration of the COVID-19 declaration under Section 56 4(b)(1) of the Act, 21 U.S.C. section 360bbb-3(b)(1), unless the authorization is terminated or revoked sooner. Performed at [facility], [place] [address], [place], [place] [address]      Labs: CBC: Recent Labs  Lab 05/01/19 1143 05/02/19 0132 05/02/19 0532  WBC 14.4* 11.7* 9.6  HGB 12.1 9.7*  9.1*  HCT 37.2 30.1* 28.6*  MCV 87.1 87.8 88.8  PLT 502* 373 0000000   Basic Metabolic Panel: Recent Labs  Lab 05/01/19 1143 05/02/19 0132 05/02/19 0829  NA 133* 136 138  K 3.4* 3.9 3.8  CL 99 108 110  CO2 17* 16* 17*  GLUCOSE 354* 195* 173*  BUN 21* 16 13  CREATININE 1.24* 0.93 0.83  CALCIUM 9.6 8.1* 8.1*   Liver Function Tests: Recent Labs  Lab 05/01/19 1750  AST 48*  ALT 26  ALKPHOS 120  BILITOT 1.0  PROT 8.6*  ALBUMIN 4.5   Recent Labs  Lab  05/01/19 1750  LIPASE 17   No results for input(s): AMMONIA in the last 168 hours. Cardiac Enzymes: No results for input(s): CKTOTAL, CKMB, CKMBINDEX, TROPONINI in the last 168 hours. BNP (last 3 results) No results for input(s): BNP in the last 8760 hours. CBG: Recent Labs  Lab 05/02/19 0748 05/02/19 0957 05/02/19 1145 05/02/19 1242 05/02/19 1343  GLUCAP 124* 205* 227* 166* 205*    Time spent: 35 minutes  Signed:  Berle Mull  [facility] 05/02/2019 8:27 AM

## 2019-05-09 ENCOUNTER — Encounter: Payer: Self-pay | Admitting: Nurse Practitioner

## 2019-05-12 ENCOUNTER — Other Ambulatory Visit: Payer: Self-pay

## 2019-05-12 ENCOUNTER — Encounter: Payer: Self-pay | Admitting: Endocrinology

## 2019-05-12 ENCOUNTER — Telehealth (INDEPENDENT_AMBULATORY_CARE_PROVIDER_SITE_OTHER): Payer: Self-pay | Admitting: Endocrinology

## 2019-05-12 DIAGNOSIS — E1059 Type 1 diabetes mellitus with other circulatory complications: Secondary | ICD-10-CM

## 2019-05-12 NOTE — Progress Notes (Signed)
Subjective:    [name], female    DOB: 05-Jul-1970, 49 y.o.   MRN: [id number]  HPI  telehealth visit today via video visit.  Alternatives to telehealth are presented to this patient, and the patient agrees to the telehealth visit. Pt is advised of the cost of the visit, and agrees to this, also.   Patient is at home, and I am at the office.   Persons attending the telehealth visit: the patient and I Pt returns for f/u of diabetes mellitus:  DM type: 1  Dx'ed: [id number] Complications: CAD.   Therapy: insulin since dx.  DKA: twice (2020 and 2021) Severe hypoglycemia: never.  Pancreatitis: never Other: she has been on pump (medtronic paradigm) rx since 2014; she stopped continuous glucose monitor, due to cost; rx is limited by lack of health insurance; she is educator, and also caregiver for her parents.   Interval history: She now takes:   basal rate of 1.775 units/hr, 6 AM-10 AM, 1.05 units/hr, 10 AM-10 PM, and 0.6 units/hr overnight.   No mealtime bolus.  correction bolus (which some people call "sensitivity," or "insulin sensitivity ratio," or just "isr") of 1 unit for each 60 by which your glucose exceeds 100.  TDD is 29 units per day (approx 20% bolus).   She is awaiting medicaid.  She was rx'ed for URI, but she did not receive steroid rx.  She did not see GI, due to cost.  Meter is downloaded today, and the printout is scanned into the record.  cbg varies from 57-400.  It is in general highest after hypoglycemia.   Past Medical History:  Diagnosis Date  . Anemia   . Anxiety   . Coronary artery disease    a. s/pPTCA DES x3 in the LAD (ostial DES placed, mid DES placed, distal DES placed)and PTCA/DES x1to intermediate branch 02/2017. b. ACS 02/08/18 with LCx stent placement.  . Depression   . Diabetes mellitus    type 1, on insulin pump  . DKA (diabetic ketoacidoses) ([place]) 12/31/2017  . Elevated platelet count   . High cholesterol   . Hypothyroidism   .  Ischemic cardiomyopathy    a. EF low-normal with basal inferior akinesis, basal septal hypokinesis by echo 01/2018.    Past Surgical History:  Procedure Laterality Date  . APPENDECTOMY    . CORONARY ARTERY BYPASS GRAFT N/A 11/04/2018   Procedure: CORONARY ARTERY BYPASS GRAFTING (CABG), ON PUMP, TIMES THREE, USING LEFT AND RIGHT INTERNAL MAMMARY ARTERIES;  Surgeon: Wonda Olds, MD;  Location: [facility];  Service: Open Heart Surgery;  Laterality: N/A;  . CORONARY STENT INTERVENTION N/A 03/20/2017   Procedure: DES x 3 LAD, DES OM; Surgeon: Burnell Blanks, MD;  Location: [facility];  Service: Cardiovascular;  Laterality: N/A;  . CORONARY/GRAFT ACUTE MI REVASCULARIZATION N/A 02/08/2018   Procedure: Coronary/Graft Acute MI Revascularization;  Surgeon: Belva Crome, MD;  Location: [facility];  Service: Cardiovascular;  Laterality: N/A;  . LEFT HEART CATH AND CORONARY ANGIOGRAPHY N/A 03/20/2017   Procedure: LEFT HEART CATH AND CORONARY ANGIOGRAPHY;  Surgeon: Burnell Blanks, MD;  Location: [facility];  Service: Cardiovascular;  Laterality: N/A;  . LEFT HEART CATH AND CORONARY ANGIOGRAPHY N/A 02/08/2018   Procedure: LEFT HEART CATH AND CORONARY ANGIOGRAPHY;  Surgeon: Belva Crome, MD;  Location: [facility];  Service: Cardiovascular;  Laterality: N/A;  . LEFT HEART CATH AND CORONARY ANGIOGRAPHY N/A 10/29/2018  Procedure: LEFT HEART CATH AND CORONARY ANGIOGRAPHY;  Surgeon: Burnell Blanks, MD;  Location: [facility];  Service: Cardiovascular;  Laterality: N/A;  . TEE WITHOUT CARDIOVERSION N/A 11/04/2018   Procedure: TRANSESOPHAGEAL ECHOCARDIOGRAM (TEE);  Surgeon: Wonda Olds, MD;  Location: [facility];  Service: Open Heart Surgery;  Laterality: N/A;    Social History   Socioeconomic History  . Marital status: Divorced    Spouse name: Not on file  . Number of children: Not on file  . Years of education: Not on file  . Highest  education level: Not on file  Occupational History  . 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 Smoking status: Never Smoker  . Smokeless tobacco: Never Used  . Tobacco comment: Use marijuana  Substance and Sexual Activity  . Alcohol use: No    Alcohol/week: 0.0 standard drinks  . Drug use: Not Currently    Types: Marijuana    Comment: last use last week, occaisonal marijuana use  . Sexual activity: Not Currently    Birth control/protection: None  Other Topics Concern  . Not on file  Social History Narrative  . Not on file   Social Determinants of Health   Financial Resource Strain:   . Difficulty of Paying Living Expenses:   Food Insecurity:   . Worried About Charity fundraiser in the Last Year:   . Arboriculturist in the Last Year:   Transportation Needs:   . Film/video editor (Medical):   Marland Kitchen Lack of Transportation (Non-Medical):   Physical Activity:   . Days of Exercise per Week:   . Minutes of Exercise per Session:   Stress:   . Feeling of Stress :   Social Connections:   . Frequency of Communication with Friends and Family:   . Frequency of Social Gatherings with Friends and Family:   . Attends Religious Services:   . Active Member of Clubs or Organizations:   . Attends Archivist Meetings:   Marland Kitchen Marital Status:   Intimate Partner Violence:   . Fear of Current or Ex-Partner:   . Emotionally Abused:   Marland Kitchen Physically Abused:   . Sexually Abused:     Current Outpatient Medications on File Prior to Visit  Medication Sig Dispense Refill  . amoxicillin-clavulanate (AUGMENTIN) 875-125 MG tablet Take 1 tablet by mouth every 12 (twelve) hours. 14 tablet 0  . aspirin 81 MG EC tablet Take 1 tablet (81 mg total) by mouth daily. 90 tablet 0  . atorvastatin (LIPITOR) 20 MG tablet Take 1 tablet (20 mg total) by mouth daily. 90 tablet 3  . clopidogrel (PLAVIX) 75 MG tablet Take 1 tablet (75 mg total) by mouth daily. 90 tablet 3  . diclofenac Sodium  (VOLTAREN) 1 % GEL Apply 2 g topically 4 (four) times daily. (Patient taking differently: Apply 2 g topically daily as needed (For pain). ) 200 g 3  . FLUoxetine (PROZAC) 40 MG capsule Take 1 capsule (40 mg total) by mouth daily. 90 capsule 3  . HUMALOG 100 UNIT/ML injection 35 UNITS PER INSULIN PUMP DAILY; MUST KEEP APPT FOR FUTURE REFILLS TO BE AUTHORIZED (Patient taking differently: Inject 35 Units into the skin See admin instructions. 35 units daily continues insulin pump) 10 mL 0  . hydrOXYzine (ATARAX/VISTARIL) 25 MG tablet Take 1 tablet (25 mg total) by mouth 3 (three) times daily as needed. (Patient taking differently: Take 25 mg by mouth 3 (three) times  daily as needed for anxiety. ) 60 tablet 1  . insulin aspart (NOVOLOG) 100 UNIT/ML injection 35 units per insulin pump daily; MUST KEEP APPT FOR FUTURE REFILLS TO BE AUTHORIZED 10 mL 0  . ipratropium (ATROVENT) 0.06 % nasal spray Place 2 sprays into both nostrils 2 (two) times daily as needed for rhinitis.    Marland Kitchen isosorbide mononitrate (IMDUR) 30 MG 24 hr tablet Take 0.5 tablets (15 mg total) by mouth daily. 45 tablet 3  . levothyroxine (EUTHYROX) 100 MCG tablet Take 1 tablet (100 mcg total) by mouth daily before breakfast. Must have office visit for refills 30 tablet 3  . metoCLOPramide (REGLAN) 5 MG tablet Take 1 tablet (5 mg total) by mouth every 12 (twelve) hours as needed for nausea or vomiting. 90 tablet 1  . montelukast (SINGULAIR) 10 MG tablet Take 1 tablet (10 mg total) by mouth at bedtime. 90 tablet 2  . nitroGLYCERIN (NITROSTAT) 0.4 MG SL tablet Place 1 tablet (0.4 mg total) under the tongue every 5 (five) minutes as needed for chest pain. 30 tablet 0  . ondansetron (ZOFRAN ODT) 4 MG disintegrating tablet Take 1 tablet (4 mg total) by mouth every 8 (eight) hours as needed for nausea or vomiting. 20 tablet 1  . pantoprazole (PROTONIX) 40 MG tablet Take 40 mg by mouth 2 (two) times daily as needed (acid reflux).     . QUEtiapine  (SEROQUEL) 50 MG tablet Take 1 tablet (50 mg total) by mouth at bedtime. May increase to 2 tablets or 100 mg at bedtime after 2 weeks if needed 90 tablet 0  . albuterol (VENTOLIN HFA) 108 (90 Base) MCG/ACT inhaler Inhale 2 puffs into the lungs every 6 (six) hours as needed for wheezing or shortness of breath. 18 g 1   No current facility-administered medications on file prior to visit.    Allergies  Allergen Reactions  . Atorvastatin     Myalgias on 80mg  dose, tolerates 20mg  ok  . Crestor [Rosuvastatin Calcium] Other (See Comments)    Severe myalgias and joint pain. Has tolerated atorvastatin though  . Peanut-Containing Drug Products Other (See Comments)    Vomiting, upset stomach and some wheezing  . Zetia [Ezetimibe]     myalgias  . Ciprofloxin Hcl [Ciprofloxacin] Nausea And Vomiting and Other (See Comments)    Severe migraine  . Food Color Red [Red Dye] Diarrhea    Family History  Problem Relation Age of Onset  . Coronary artery disease Father   . Congestive Heart Failure Father   . Asthma Father   . Cancer Mother        T cell lymphoma    LMP 04/23/2019    Review of Systems Fever is resolved.  intermitt n/v persists.     Objective:   Physical Exam       Assessment & Plan:  Type 1 DM, with CAD: poor glycemic control.  Hypoglycemia: this limits aggressiveness of glycemic control.  N/v.  Until this is effectively rx'ed, she is unlikely to achieve good glycemic control. Chronic noncompliance with insulin dosing.  This limits rx effectiveness SDOH: cost issues are limiting medical rx  Patient Instructions  check your blood sugar 8 times a day.  vary the time of day when you check, between before the 3 meals, and at bedtime.  also check if you have symptoms of your blood sugar being too high or too low.  please keep a record of the readings and bring it to your next appointment here.  please call us sooner if your blood sugar goes below 70, or if you have a lot of  readings over 200.   Please take these pump settings:  basal rate of 1.3 units/hr, 6 AM-10 PM, and 0.7 units/hr overnight.  Suspend for activity for 1-2 hrs No mealtime bolus.  correction bolus (which some people call "sensitivity," or "insulin sensitivity ratio," or just "isr") of 1 unit for each 60 by which your glucose exceeds 100.    On this type of insulin schedule, you should eat meals on a regular schedule.  If a meal is missed or significantly delayed, your blood sugar could go low.   It is better to emphasize the correction bolus than increasing the basal.  Therefore, please increase the basal just slightly.   Please come back for a follow-up appointment in 1 month.

## 2019-05-12 NOTE — Patient Instructions (Addendum)
check your blood sugar 8 times a day.  vary the time of day when you check, between before the 3 meals, and at bedtime.  also check if you have symptoms of your blood sugar being too high or too low.  please keep a record of the readings and bring it to your next appointment here.  please call us sooner if your blood sugar goes below 70, or if you have a lot of readings over 200.   Please take these pump settings:  basal rate of 1.3 units/hr, 6 AM-10 PM, and 0.7 units/hr overnight.  Suspend for activity for 1-2 hrs No mealtime bolus.  correction bolus (which some people call "sensitivity," or "insulin sensitivity ratio," or just "isr") of 1 unit for each 60 by which your glucose exceeds 100.    On this type of insulin schedule, you should eat meals on a regular schedule.  If a meal is missed or significantly delayed, your blood sugar could go low.   It is better to emphasize the correction bolus than increasing the basal.  Therefore, please increase the basal just slightly.   Please come back for a follow-up appointment in 1 month.

## 2019-05-13 ENCOUNTER — Ambulatory Visit: Payer: Self-pay | Attending: Nurse Practitioner

## 2019-05-13 ENCOUNTER — Other Ambulatory Visit: Payer: Self-pay

## 2019-05-15 ENCOUNTER — Ambulatory Visit: Payer: MEDICAID | Attending: Nurse Practitioner | Admitting: Licensed Clinical Social Worker

## 2019-05-15 ENCOUNTER — Other Ambulatory Visit: Payer: Self-pay

## 2019-05-15 DIAGNOSIS — F331 Major depressive disorder, recurrent, moderate: Secondary | ICD-10-CM

## 2019-05-15 DIAGNOSIS — F411 Generalized anxiety disorder: Secondary | ICD-10-CM

## 2019-05-20 ENCOUNTER — Other Ambulatory Visit: Payer: Self-pay | Admitting: Endocrinology

## 2019-05-20 MED FILL — ?HUMALOG 100 UNITS/ML VIAL: 100 | 28 days supply | Qty: 10 | Fill #0

## 2019-05-22 ENCOUNTER — Other Ambulatory Visit: Payer: Self-pay

## 2019-05-22 ENCOUNTER — Ambulatory Visit: Payer: Self-pay | Attending: Nurse Practitioner | Admitting: Licensed Clinical Social Worker

## 2019-05-22 DIAGNOSIS — F331 Major depressive disorder, recurrent, moderate: Secondary | ICD-10-CM

## 2019-05-22 DIAGNOSIS — F411 Generalized anxiety disorder: Secondary | ICD-10-CM

## 2019-05-22 NOTE — BH Specialist Note (Signed)
Integrated Behavioral Health Visit via Telemedicine (Telephone)  05/22/2019 ANIRAH HUTMACHER [id number]   Session Start time: 2:30 PM  Session End time: 3:15 AM Total time: 79   Referring Provider: NP Raul Del Type of Visit: Telephonic Patient location: Home [facility] Provider location: Office All persons participating in visit: LCSW and Patient  Confirmed patient's address: Yes  Confirmed patient's phone number: Yes  Any changes to demographics: No   Confirmed patient's insurance: Yes  Any changes to patient's insurance: No   Discussed confidentiality: Yes    The following statements were read to the patient and/or legal guardian that are established with the [facility] Provider.  "The purpose of this phone visit is to provide behavioral health care while limiting exposure to the coronavirus (COVID19).  There is a possibility of technology failure and discussed alternative modes of communication if that failure occurs."  "By engaging in this telephone visit, you consent to the provision of healthcare.  Additionally, you authorize for your insurance to be billed for the services provided during this telephone visit."   Patient and/or legal guardian consented to telephone visit: Yes   PRESENTING CONCERNS: Patient and/or family reports the following symptoms/concerns: Pt reports difficulty managing depression and anxiety symptoms Duration of problem: Ongoing; Severity of problem: moderate  STRENGTHS (Protective Factors/Coping Skills): Pt has good insight Pt has desire to change Pt is participating in medication management  GOALS ADDRESSED: Patient will: 1.  Reduce symptoms of: anxiety and depression  2.  Increase knowledge and/or ability of: self-management skills  3.  Demonstrate ability to: Increase adequate support systems for patient/family  INTERVENTIONS: Interventions utilized:  Solution-Focused Strategies and Supportive Counseling Standardized Assessments  completed: Not Needed  ASSESSMENT: Patient currently experiencing depression and anxiety symptoms triggered by difficulty managing health and financial strain.   Patient may benefit from continued medication management and psychotherapy.  PLAN: 1. Follow up with behavioral health clinician on : 05/29/19 2. Behavioral recommendations: Utilize strategies discussed 3. Referral(s): [place] (In Clinic)  Rebekah Chesterfield, [place] 06/22/2019 10:29 AM

## 2019-05-27 NOTE — BH Specialist Note (Signed)
Integrated Behavioral Health Visit via Telemedicine (Telephone)  05/15/2019 MACKINZE KOPEC [id number]   Session Start time: 3:15 PM  Session End time: 4:00 PM Total time: 43   Referring Provider: NP Raul Del Type of Visit: Telephonic Patient location: Home [facility] Provider location: Office All persons participating in visit: LCSW  Confirmed patient's address: Yes  Confirmed patient's phone number: Yes  Any changes to demographics: No   Confirmed patient's insurance: Yes  Any changes to patient's insurance: No   Discussed confidentiality: Yes    The following statements were read to the patient and/or legal guardian that are established with the [facility] Provider.  "The purpose of this phone visit is to provide behavioral health care while limiting exposure to the coronavirus (COVID19).  There is a possibility of technology failure and discussed alternative modes of communication if that failure occurs."  "By engaging in this telephone visit, you consent to the provision of healthcare.  Additionally, you authorize for your insurance to be billed for the services provided during this telephone visit."   Patient and/or legal guardian consented to telephone visit: Yes   PRESENTING CONCERNS: Patient and/or family reports the following symptoms/concerns: Pt reports increase in symptoms triggered by recent hospitalization. Pt feels correlation between diabetes and anxiety  Duration of problem: Ongoing; Severity of problem: moderate  STRENGTHS (Protective Factors/Coping Skills): Pt has good insight Pt has desire to change Pt is participating in medication management  GOALS ADDRESSED: Patient will: 1.  Reduce symptoms of: anxiety and depression  2.  Increase knowledge and/or ability of: self-management skills  3.  Demonstrate ability to: Increase healthy adjustment to current life circumstances  INTERVENTIONS: Interventions utilized:  Solution-Focused Strategies and  Mindfulness or Relaxation Training Standardized Assessments completed: Not Needed  ASSESSMENT: Patient currently experiencing difficulty managing mental and physical health conditions.  Patient may benefit from continued medication management and brief therapy. Pt is utilizing food journal to assist with identifying triggers and has good insight of how avoidance in the past has negatively impacted health. Pt is no longer taking Seroquel noting that Prozac has been effective in managing anxiety. Progressive Muscle relaxation strategies encouraged.  PLAN: 1. Follow up with behavioral health clinician on : 05/22/19  2. Behavioral recommendations: Continue with medication management and utilize strategies discussed 3. Referral(s): [place] (In Clinic)  Rebekah Chesterfield, [place] 05/27/2019 4:48 PM

## 2019-05-29 ENCOUNTER — Other Ambulatory Visit: Payer: Self-pay

## 2019-05-29 ENCOUNTER — Ambulatory Visit: Payer: Self-pay | Attending: Nurse Practitioner | Admitting: Licensed Clinical Social Worker

## 2019-05-29 DIAGNOSIS — F411 Generalized anxiety disorder: Secondary | ICD-10-CM

## 2019-05-29 DIAGNOSIS — F331 Major depressive disorder, recurrent, moderate: Secondary | ICD-10-CM

## 2019-06-03 ENCOUNTER — Ambulatory Visit: Payer: Self-pay | Attending: Nurse Practitioner | Admitting: Nurse Practitioner

## 2019-06-03 ENCOUNTER — Encounter: Payer: Self-pay | Admitting: Nurse Practitioner

## 2019-06-03 ENCOUNTER — Other Ambulatory Visit: Payer: Self-pay

## 2019-06-03 DIAGNOSIS — R059 Cough, unspecified: Secondary | ICD-10-CM

## 2019-06-03 DIAGNOSIS — K3184 Gastroparesis: Secondary | ICD-10-CM

## 2019-06-03 DIAGNOSIS — R05 Cough: Secondary | ICD-10-CM

## 2019-06-03 DIAGNOSIS — E039 Hypothyroidism, unspecified: Secondary | ICD-10-CM

## 2019-06-03 MED ORDER — BENZONATATE 200 MG PO CAPS: 200.0000 mg | ORAL_CAPSULE | Freq: Two times a day (BID) | ORAL | 6 refills | Status: DC | PRN

## 2019-06-03 MED ORDER — LEVOTHYROXINE SODIUM 100 MCG PO TABS: 100.0000 ug | ORAL_TABLET | Freq: Every day | ORAL | 3 refills | Status: DC

## 2019-06-03 MED ORDER — PANTOPRAZOLE SODIUM 40 MG PO TBEC: 40.0000 mg | DELAYED_RELEASE_TABLET | Freq: Two times a day (BID) | ORAL | 3 refills | Status: DC | PRN

## 2019-06-03 NOTE — Progress Notes (Signed)
Virtual Visit via Telephone Note Due to national recommendations of social distancing due to [place] 19, telehealth visit is felt to be most appropriate for this patient at this time.  I discussed the limitations, risks, security and privacy concerns of performing an evaluation and management service by telephone and the availability of in person appointments. I also discussed with the patient that there may be a patient responsible charge related to this service. The patient expressed understanding and agreed to proceed.    I connected with Jennifer Hogan on 06/03/19  at   1:50 PM EDT  EDT by telephone and verified that I am speaking with the correct person using two identifiers.   Consent I discussed the limitations, risks, security and privacy concerns of performing an evaluation and management service by telephone and the availability of in person appointments. I also discussed with the patient that there may be a patient responsible charge related to this service. The patient expressed understanding and agreed to proceed.   Location of Patient: Private  Residence   Location of Provider: [place] and [place] participating in Telemedicine visit: [name] FNP-BC [place]    History of Present Illness: Telemedicine visit for: Cough  Treated for sinusitis with Augmentin for 7 days on 05-06-2019 after an E visit. Currently still with lingering cough. Initially had a fever as well which has currently subsided. Has been using nebulizer treatments 1-2 times per day and rescue inhaler which has helped to improve symptoms. She also started using her father's Spiriva inhaler which I have advised against today.   Past Medical History:  Diagnosis Date  . Anemia   . Anxiety   . Coronary artery disease    a. s/pPTCA DES x3 in the LAD (ostial DES placed, mid DES placed, distal DES placed)and PTCA/DES x1to intermediate branch 02/2017.  b. ACS 02/08/18 with LCx stent placement.  . Depression   . Diabetes mellitus    type 1, on insulin pump  . DKA (diabetic ketoacidoses) ([place]) 12/31/2017  . Elevated platelet count   . High cholesterol   . Hypothyroidism   . Ischemic cardiomyopathy    a. EF low-normal with basal inferior akinesis, basal septal hypokinesis by echo 01/2018.    Past Surgical History:  Procedure Laterality Date  . APPENDECTOMY    . CORONARY ARTERY BYPASS GRAFT N/A 11/04/2018   Procedure: CORONARY ARTERY BYPASS GRAFTING (CABG), ON PUMP, TIMES THREE, USING LEFT AND RIGHT INTERNAL MAMMARY ARTERIES;  Surgeon: Wonda Olds, MD;  Location: [place];  Service: Open Heart Surgery;  Laterality: N/A;  . CORONARY STENT INTERVENTION N/A 03/20/2017   Procedure: DES x 3 LAD, DES OM; Surgeon: Burnell Blanks, MD;  Location: [place] [facility];  Service: Cardiovascular;  Laterality: N/A;  . CORONARY/GRAFT ACUTE MI REVASCULARIZATION N/A 02/08/2018   Procedure: Coronary/Graft Acute MI Revascularization;  Surgeon: Belva Crome, MD;  Location: [place] [facility];  Service: Cardiovascular;  Laterality: N/A;  . LEFT HEART CATH AND CORONARY ANGIOGRAPHY N/A 03/20/2017   Procedure: LEFT HEART CATH AND CORONARY ANGIOGRAPHY;  Surgeon: Burnell Blanks, MD;  Location: [place] [facility];  Service: Cardiovascular;  Laterality: N/A;  . LEFT HEART CATH AND CORONARY ANGIOGRAPHY N/A 02/08/2018   Procedure: LEFT HEART CATH AND CORONARY ANGIOGRAPHY;  Surgeon: Belva Crome, MD;  Location: [place] [facility];  Service: Cardiovascular;  Laterality: N/A;  . LEFT HEART CATH AND CORONARY ANGIOGRAPHY N/A 10/29/2018  Procedure: LEFT HEART CATH AND CORONARY ANGIOGRAPHY;  Surgeon: Burnell Blanks, MD;  Location: [place] [facility];  Service: Cardiovascular;  Laterality: N/A;  . TEE WITHOUT CARDIOVERSION N/A 11/04/2018   Procedure: TRANSESOPHAGEAL ECHOCARDIOGRAM (TEE);  Surgeon: Wonda Olds, MD;  Location: [place];  Service: Open  Heart Surgery;  Laterality: N/A;    Family History  Problem Relation Age of Onset  . Coronary artery disease Father   . Congestive Heart Failure Father   . Asthma Father   . Cancer Mother        T cell lymphoma    Social History   Socioeconomic History  . Marital status: Divorced    Spouse name: Not on file  . Number of children: Not on file  . Years of education: Not on file  . Highest education level: Not on file  Occupational History  . 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 Smoking status: Never Smoker  . Smokeless tobacco: Never Used  . Tobacco comment: Use marijuana  Substance and Sexual Activity  . Alcohol use: No    Alcohol/week: 0.0 standard drinks  . Drug use: Not Currently    Types: Marijuana    Comment: last use last week, occaisonal marijuana use  . Sexual activity: Not Currently    Birth control/protection: None  Other Topics Concern  . Not on file  Social History Narrative  . Not on file   Social Determinants of Health   Financial Resource Strain:   . Difficulty of Paying Living Expenses:   Food Insecurity:   . Worried About Charity fundraiser in the Last Year:   . Arboriculturist in the Last Year:   Transportation Needs:   . Film/video editor (Medical):   Marland Kitchen Lack of Transportation (Non-Medical):   Physical Activity:   . Days of Exercise per Week:   . Minutes of Exercise per Session:   Stress:   . Feeling of Stress :   Social Connections:   . Frequency of Communication with Friends and Family:   . Frequency of Social Gatherings with Friends and Family:   . Attends Religious Services:   . Active Member of Clubs or Organizations:   . Attends Archivist Meetings:   Marland Kitchen Marital Status:      Observations/Objective: Awake, alert and oriented x 3   Review of Systems  Constitutional: Negative for fever, malaise/fatigue and weight loss.  HENT: Negative.  Negative for nosebleeds.   Eyes: Negative.  Negative for  blurred vision, double vision and photophobia.  Respiratory: Positive for cough, sputum production and shortness of breath.   Cardiovascular: Negative.  Negative for chest pain, palpitations and leg swelling.  Gastrointestinal: Negative.  Negative for heartburn, nausea and vomiting.  Musculoskeletal: Negative.  Negative for myalgias.  Neurological: Negative.  Negative for dizziness, focal weakness, seizures and headaches.  Psychiatric/Behavioral: Negative.  Negative for suicidal ideas.    Assessment and Plan: Dinisha was seen today for cough.  Diagnoses and all orders for this visit:  Cough in adult patient -     benzonatate (TESSALON) 200 MG capsule; Take 1 capsule (200 mg total) by mouth 2 (two) times daily as needed for cough. -     pantoprazole (PROTONIX) 40 MG tablet; Take 1 tablet (40 mg total) by mouth 2 (two) times daily as needed (acid reflux).  Gastroparesis -     pantoprazole (PROTONIX) 40 MG tablet; Take 1 tablet (40 mg total)  by mouth 2 (two) times daily as needed (acid reflux).  Hypothyroidism, unspecified type -     levothyroxine (EUTHYROX) 100 MCG tablet; Take 1 tablet (100 mcg total) by mouth daily before breakfast.     Follow Up Instructions Return in about 3 months (around 09/03/2019).     I discussed the assessment and treatment plan with the patient. The patient was provided an opportunity to ask questions and all were answered. The patient agreed with the plan and demonstrated an understanding of the instructions.   The patient was advised to call back or seek an in-person evaluation if the symptoms worsen or if the condition fails to improve as anticipated.  I provided 17 minutes of non-face-to-face time during this encounter including median intraservice time, reviewing previous notes, labs, imaging, medications and explaining diagnosis and management.  [name], FNP-BC

## 2019-06-04 ENCOUNTER — Encounter: Payer: Self-pay | Admitting: Nurse Practitioner

## 2019-06-05 ENCOUNTER — Other Ambulatory Visit: Payer: Self-pay

## 2019-06-05 ENCOUNTER — Ambulatory Visit: Payer: Self-pay | Attending: Nurse Practitioner | Admitting: Licensed Clinical Social Worker

## 2019-06-05 DIAGNOSIS — F411 Generalized anxiety disorder: Secondary | ICD-10-CM

## 2019-06-05 DIAGNOSIS — F331 Major depressive disorder, recurrent, moderate: Secondary | ICD-10-CM

## 2019-06-06 ENCOUNTER — Emergency Department (HOSPITAL_COMMUNITY): Payer: Self-pay

## 2019-06-06 ENCOUNTER — Other Ambulatory Visit: Payer: Self-pay

## 2019-06-06 ENCOUNTER — Emergency Department (HOSPITAL_COMMUNITY)
Admission: EM | Admit: 2019-06-06 | Discharge: 2019-06-06 | Disposition: A | Discharge status: Home / Self Care | Payer: Self-pay | Attending: Emergency Medicine | Admitting: Emergency Medicine

## 2019-06-06 ENCOUNTER — Encounter (HOSPITAL_COMMUNITY): Payer: Self-pay | Admitting: Emergency Medicine

## 2019-06-06 DIAGNOSIS — Z79899 Other long term (current) drug therapy: Secondary | ICD-10-CM | POA: Insufficient documentation

## 2019-06-06 DIAGNOSIS — Z951 Presence of aortocoronary bypass graft: Secondary | ICD-10-CM | POA: Insufficient documentation

## 2019-06-06 DIAGNOSIS — Z7901 Long term (current) use of anticoagulants: Secondary | ICD-10-CM | POA: Insufficient documentation

## 2019-06-06 DIAGNOSIS — E109 Type 1 diabetes mellitus without complications: Secondary | ICD-10-CM | POA: Insufficient documentation

## 2019-06-06 DIAGNOSIS — I252 Old myocardial infarction: Secondary | ICD-10-CM | POA: Insufficient documentation

## 2019-06-06 DIAGNOSIS — J40 Bronchitis, not specified as acute or chronic: Secondary | ICD-10-CM | POA: Insufficient documentation

## 2019-06-06 DIAGNOSIS — Z20822 Contact with and (suspected) exposure to covid-19: Secondary | ICD-10-CM | POA: Insufficient documentation

## 2019-06-06 LAB — BASIC METABOLIC PANEL
Anion gap: 9 (ref 5–15)
BUN: 12 mg/dL (ref 6–20)
CO2: 22 mmol/L (ref 22–32)
Calcium: 9.3 mg/dL (ref 8.9–10.3)
Chloride: 106 mmol/L (ref 98–111)
Creatinine, Ser: 1.05 mg/dL — ABNORMAL HIGH (ref 0.44–1.00)
GFR calc Af Amer: >60 mL/min (ref >60)
GFR calc non Af Amer: >60 mL/min (ref >60)
Glucose, Bld: 166 mg/dL — ABNORMAL HIGH (ref 70–99)
Potassium: 4.4 mmol/L (ref 3.5–5.1)
Sodium: 137 mmol/L (ref 135–145)

## 2019-06-06 LAB — CBG MONITORING, ED
Glucose-Capillary: 51 mg/dL — ABNORMAL LOW (ref 70–99)
Glucose-Capillary: 177 mg/dL — ABNORMAL HIGH (ref 70–99)

## 2019-06-06 LAB — D-DIMER, QUANTITATIVE: D-Dimer, Quant: 0.6 ug/mL-FEU — ABNORMAL HIGH (ref 0.00–0.50)

## 2019-06-06 LAB — TROPONIN I (HIGH SENSITIVITY): Troponin I (High Sensitivity): 3 ng/L (ref <18)

## 2019-06-06 LAB — CBC
HCT: 37 % (ref 36.0–46.0)
Hemoglobin: 11.9 g/dL — ABNORMAL LOW (ref 12.0–15.0)
MCH: 28.3 pg (ref 26.0–34.0)
MCHC: 32.2 g/dL (ref 30.0–36.0)
MCV: 87.9 fL (ref 80.0–100.0)
Platelets: 411 10*3/uL — ABNORMAL HIGH (ref 150–400)
RBC: 4.21 MIL/uL (ref 3.87–5.11)
RDW: 15.2 % (ref 11.5–15.5)
WBC: 6.1 10*3/uL (ref 4.0–10.5)
nRBC: 0 % (ref 0.0–0.2)

## 2019-06-06 LAB — SARS CORONAVIRUS 2 BY RT PCR (HOSPITAL ORDER, PERFORMED IN ~~LOC~~ HOSPITAL LAB): SARS Coronavirus 2: NEGATIVE

## 2019-06-06 MED ORDER — GUAIFENESIN ER 600 MG PO TB12
600.0000 mg | ORAL_TABLET | Freq: Once | ORAL | Status: AC
  Administered 2019-06-06: 600 mg via ORAL
  Filled 2019-06-06: qty 1

## 2019-06-06 MED ORDER — IOHEXOL 350 MG/ML SOLN
100.0000 mL | Freq: Once | INTRAVENOUS | Status: AC | PRN
  Administered 2019-06-06: 100 mL via INTRAVENOUS

## 2019-06-06 NOTE — Discharge Instructions (Addendum)
You can continue to take tessalon according to label instructions.  Start taking mucinex, available over the counter according to label instructions.

## 2019-06-06 NOTE — ED Notes (Signed)
Pt states she feels like her sugar is dropping. Blood sugar 51. RN notified. Cheese and crackers and graham crackers given and told patient I would recheck her sugar in 30 minutes.

## 2019-06-06 NOTE — ED Triage Notes (Signed)
Patient states she recently had sinus infection and then a URI. States Monday she thought she was improving until the next night. States she has never been diagnosed with asthma but feels like she is having an asthma attack. States she has been taking tessalon pearls and nebulizer treatment w/o relief. Productive cough with green mucus.

## 2019-06-06 NOTE — ED Notes (Addendum)
CBG addressed. Pt. Provided meal with fluids. Nurse will monitor CBG.

## 2019-06-06 NOTE — ED Provider Notes (Signed)
[facility] EMERGENCY DEPARTMENT Provider Note   CSN: [id number] Arrival date & time: 06/06/19  1302     History Chief Complaint  Patient presents with  . Cough    Jennifer FUSSELMAN is a 49 y.o. female.  The history is provided by the patient and medical records. No language interpreter was used.  Cough  Jennifer Hogan is a 49 y.o. female who presents to the Emergency Department complaining of cough. She presents the emergency department complaining of about one month of cough. She initially experienced what she describes as a sinus infection with sinus congestion and drainage after she was hospitalized for DKA. She was treated with a course of antibiotics the sinus drainage improved but then she developed a cough that feels like it's primarily in her chest. She saw her PCP yesterday and was prescribed testing on pearls. She also has been self treating for possible asthma with albuterol inhaler and nebulizer treatments. She feels no significant improvement in her symptoms with the albuterol treatments. On Tuesday night her symptoms significantly worsened and she feels like she cannot take a deep breath. She has been experiencing a temperature to 100.5. Her cough is usually unproductive but she has occasionally had production of dark, thick mucus. She did have some right sided chest pain with moving earlier. She describes it as the discomfort. No lower extremity swelling or pain. No prior similar symptoms.    Past Medical History:  Diagnosis Date  . Anemia   . Anxiety   . Coronary artery disease    a. s/pPTCA DES x3 in the LAD (ostial DES placed, mid DES placed, distal DES placed)and PTCA/DES x1to intermediate branch 02/2017. b. ACS 02/08/18 with LCx stent placement.  . Depression   . Diabetes mellitus    type 1, on insulin pump  . DKA (diabetic ketoacidoses) ([place]) 12/31/2017  . Elevated platelet count   . High cholesterol   . Hypothyroidism   . Ischemic  cardiomyopathy    a. EF low-normal with basal inferior akinesis, basal septal hypokinesis by echo 01/2018.    Patient Active Problem List   Diagnosis Date Noted  . Emesis 05/01/2019  . AKI (acute kidney injury) ([place]) 05/01/2019  . Severe hyperglycemia due to diabetes mellitus ([place]) 03/15/2019  . Pleural effusion 12/09/2018  . Abdominal pain 11/28/2018  . Intractable nausea and vomiting 11/28/2018  . Diarrhea 11/28/2018  . DKA (diabetic ketoacidoses) ([place]) 11/25/2018  . Sinus tachycardia 11/24/2018  . Chronic systolic heart failure ([place])   . Cardiomyopathy ([place])   . Hypotension 10/29/2018  . DKA, type 1 ([place]) 05/24/2018  . Moderate episode of recurrent major depressive disorder ([place]) 03/27/2018  . Epigastric pain   . Hyperglycemia due to type 1 diabetes mellitus ([place]) 02/14/2018  . Hyperlipidemia LDL goal <70 02/14/2018  . S/P drug eluting coronary stent placement   . ACS (acute coronary syndrome) ([place]) 02/08/2018  . ST elevation myocardial infarction (STEMI) ([place]) 02/08/2018  . DKA (diabetic ketoacidosis) ([place]) 12/31/2017  . Marijuana abuse 12/31/2017  . Gastroparesis 12/06/2017  . Gastroparesis due to DM ([place]) 03/28/2017  . Coronary artery disease 03/28/2017  . Ischemic cardiomyopathy 03/28/2017  . Pure hypercholesterolemia   . NSTEMI (non-ST elevated myocardial infarction) ([place])   . Anemia 03/17/2017  . Allergic state 08/07/2015  . Vitiligo 07/09/2012  . Type 1 diabetes mellitus ([place]) 07/09/2012  . Depression 12/04/2010  . Non-intractable vomiting with nausea 12/04/2010  . Hypothyroidism 12/04/2010    Past Surgical History:  Procedure  Laterality Date  . APPENDECTOMY    . CORONARY ARTERY BYPASS GRAFT N/A 11/04/2018   Procedure: CORONARY ARTERY BYPASS GRAFTING (CABG), ON PUMP, TIMES THREE, USING LEFT AND RIGHT INTERNAL MAMMARY ARTERIES;  Surgeon: Wonda Olds, MD;  Location: [facility];  Service: Open Heart Surgery;  Laterality: N/A;  . CORONARY STENT INTERVENTION N/A  03/20/2017   Procedure: DES x 3 LAD, DES OM; Surgeon: Burnell Blanks, MD;  Location: [facility];  Service: Cardiovascular;  Laterality: N/A;  . CORONARY/GRAFT ACUTE MI REVASCULARIZATION N/A 02/08/2018   Procedure: Coronary/Graft Acute MI Revascularization;  Surgeon: Belva Crome, MD;  Location: [facility];  Service: Cardiovascular;  Laterality: N/A;  . LEFT HEART CATH AND CORONARY ANGIOGRAPHY N/A 03/20/2017   Procedure: LEFT HEART CATH AND CORONARY ANGIOGRAPHY;  Surgeon: Burnell Blanks, MD;  Location: [facility];  Service: Cardiovascular;  Laterality: N/A;  . LEFT HEART CATH AND CORONARY ANGIOGRAPHY N/A 02/08/2018   Procedure: LEFT HEART CATH AND CORONARY ANGIOGRAPHY;  Surgeon: Belva Crome, MD;  Location: [facility];  Service: Cardiovascular;  Laterality: N/A;  . LEFT HEART CATH AND CORONARY ANGIOGRAPHY N/A 10/29/2018   Procedure: LEFT HEART CATH AND CORONARY ANGIOGRAPHY;  Surgeon: Burnell Blanks, MD;  Location: [facility];  Service: Cardiovascular;  Laterality: N/A;  . TEE WITHOUT CARDIOVERSION N/A 11/04/2018   Procedure: TRANSESOPHAGEAL ECHOCARDIOGRAM (TEE);  Surgeon: Wonda Olds, MD;  Location: [facility];  Service: Open Heart Surgery;  Laterality: N/A;     OB History    Gravida  0   Para  0   Term  0   Preterm  0   AB  0   Living  0     SAB  0   TAB  0   Ectopic  0   Multiple  0   Live Births  0           Family History  Problem Relation Age of Onset  . Coronary artery disease Father   . Congestive Heart Failure Father   . Asthma Father   . Cancer Mother        T cell lymphoma    Social History   Tobacco Use  . Smoking status: Never Smoker  . Smokeless tobacco: Never Used  . Tobacco comment: Use marijuana  Substance Use Topics  . Alcohol use: No    Alcohol/week: 0.0 standard drinks  . Drug use: Not Currently    Types: Marijuana    Comment: last use last week, occaisonal marijuana use     Home Medications Prior to Admission medications   Medication Sig Start Date End Date Taking? Authorizing Provider  acetaminophen (TYLENOL) 325 MG tablet Take 650 mg by mouth daily as needed (couch/pain/fever).   Yes [provider]  albuterol (VENTOLIN HFA) 108 (90 Base) MCG/ACT inhaler Inhale 2 puffs into the lungs every 6 (six) hours as needed for wheezing or shortness of breath. 03/19/19 08/23/19 Yes [name], NP  aspirin 81 MG EC tablet Take 1 tablet (81 mg total) by mouth daily. 12/07/17  Yes Amin, Ankit Chirag, MD  atorvastatin (LIPITOR) 20 MG tablet Take 1 tablet (20 mg total) by mouth daily. Patient taking differently: Take 20 mg by mouth at bedtime.  02/19/19 02/19/20 Yes Turner, Eber Hong, MD  b complex vitamins tablet Take 1 tablet by mouth daily.   Yes [provider]  benzonatate (TESSALON) 200 MG capsule Take 1 capsule (200  mg total) by mouth 2 (two) times daily as needed for cough. 06/03/19  Yes [name], NP  clopidogrel (PLAVIX) 75 MG tablet Take 1 tablet (75 mg total) by mouth daily. 03/05/19  Yes Turner, Eber Hong, MD  diclofenac Sodium (VOLTAREN) 1 % GEL Apply 2 g topically 4 (four) times daily. Patient taking differently: Apply 2 g topically daily as needed (For pain).  03/19/19  Yes [name], NP  ferrous sulfate 325 (65 FE) MG tablet Take 325 mg by mouth See admin instructions. Take one tablet (325 mg) by mouth once or twice during menstrual cycle   Yes [provider]  FLUoxetine (PROZAC) 40 MG capsule Take 1 capsule (40 mg total) by mouth daily. 05/06/19  Yes [name], NP  fluticasone (FLONASE) 50 MCG/ACT nasal spray Place 1 spray into both nostrils at bedtime.   Yes [provider]  hydrOXYzine (ATARAX/VISTARIL) 25 MG tablet Take 1 tablet (25 mg total) by mouth 3 (three) times daily as needed. Patient taking differently: Take 25 mg by mouth 3 (three) times daily as needed for anxiety.  03/19/19  Yes [name], NP  Insulin Human (INSULIN PUMP) SOLN Inject into the skin continuous. Humalog   Yes [provider]  ipratropium (ATROVENT) 0.06 % nasal spray Place 2 sprays into both nostrils 2 (two) times daily as needed for rhinitis.   Yes [provider]  isosorbide mononitrate (IMDUR) 30 MG 24 hr tablet Take 0.5 tablets (15 mg total) by mouth daily. 01/08/19  Yes [name], MD  levothyroxine (EUTHYROX) 100 MCG tablet Take 1 tablet (100 mcg total) by mouth daily before breakfast. 06/03/19  Yes [name], NP  metoCLOPramide (REGLAN) 5 MG tablet Take 1 tablet (5 mg total) by mouth every 12 (twelve) hours as needed for nausea or vomiting. 12/17/18 12/17/19 Yes McClung, Dionne Bucy, PA-C  montelukast (SINGULAIR) 10 MG tablet Take 1 tablet (10 mg total) by mouth at bedtime. 03/19/19 06/17/19 Yes [name], NP  nitroGLYCERIN (NITROSTAT) 0.4 MG SL tablet Place 1 tablet (0.4 mg total) under the tongue every 5 (five) minutes as needed for chest pain. 01/08/19  Yes Turner, Eber Hong, MD  ondansetron (ZOFRAN ODT) 4 MG disintegrating tablet Take 1 tablet (4 mg total) by mouth every 8 (eight) hours as needed for nausea or vomiting. 12/17/18  Yes McClung, Angela M, PA-C  pantoprazole (PROTONIX) 40 MG tablet Take 1 tablet (40 mg total) by mouth 2 (two) times daily as needed (acid reflux). Patient taking differently: Take 40 mg by mouth daily.  06/03/19 07/03/19 Yes [name], NP  Phenyleph-Doxyl-DM-Aspirin (ALKA-SELTZER PLUS COLD DY/NGHT PO) Take 1 tablet by mouth at bedtime as needed (cough).   Yes [provider]  insulin aspart (NOVOLOG) 100 UNIT/ML injection 35 units per insulin pump daily; MUST KEEP APPT FOR FUTURE REFILLS TO BE AUTHORIZED Patient not taking: Reported on 06/06/2019 03/24/19   Renato Shin, MD  insulin lispro (HUMALOG) 100 UNIT/ML injection Inject 0.35 mLs (35 Units total) into the skin See admin instructions. 35 units daily continues insulin pump Patient not  taking: Reported on 06/06/2019 05/20/19   Renato Shin, MD  QUEtiapine (SEROQUEL) 50 MG tablet Take 1 tablet (50 mg total) by mouth at bedtime. May increase to 2 tablets or 100 mg at bedtime after 2 weeks if needed Patient not taking: Reported on 06/03/2019 03/19/19 06/17/19  [name], NP    Allergies    Atorvastatin, Crestor [employer]  calcium], Monosodium glutamate, Peanut-containing drug products, Zetia [ezetimibe], Ciprofloxin hcl [ciprofloxacin], and Food color red [red dye]  Review of Systems   Review of Systems  Respiratory: Positive for cough.   All other systems reviewed and are negative.   Physical Exam Updated Vital Signs BP 107/76 (BP Location: Left Arm)   Pulse 95   Temp 98.7 F (37.1 C) (Oral)   Resp 16   Ht 5\' 5"  (1.651 m)   Wt 68 kg   LMP 05/15/2019   SpO2 99%   BMI 24.96 kg/m   Physical Exam Vitals and nursing note reviewed.  Constitutional:      Appearance: She is well-developed.  HENT:     Head: Normocephalic and atraumatic.  Cardiovascular:     Rate and Rhythm: Normal rate and regular rhythm.     Heart sounds: No murmur.  Pulmonary:     Effort: Pulmonary effort is normal. No respiratory distress.     Breath sounds: Normal breath sounds.  Abdominal:     Palpations: Abdomen is soft.     Tenderness: There is no abdominal tenderness. There is no guarding or rebound.  Musculoskeletal:        General: No swelling or tenderness.  Skin:    General: Skin is warm and dry.  Neurological:     Mental Status: She is alert and oriented to person, place, and time.  Psychiatric:        Behavior: Behavior normal.     ED Results / Procedures / Treatments   Labs (all labs ordered are listed, but only abnormal results are displayed) Labs Reviewed  CBC - Abnormal; Notable for the following components:      Result Value   Hemoglobin 11.9 (*)    Platelets 411 (*)    All other components within normal limits  BASIC METABOLIC PANEL - Abnormal; Notable  for the following components:   Glucose, Bld 166 (*)    Creatinine, Ser 1.05 (*)    All other components within normal limits  D-DIMER, QUANTITATIVE (NOT AT [facility]) - Abnormal; Notable for the following components:   D-Dimer, Quant 0.60 (*)    All other components within normal limits  CBG MONITORING, ED - Abnormal; Notable for the following components:   Glucose-Capillary 51 (*)    All other components within normal limits  CBG MONITORING, ED - Abnormal; Notable for the following components:   Glucose-Capillary 177 (*)    All other components within normal limits  SARS CORONAVIRUS 2 BY RT PCR (HOSPITAL ORDER, [facility])  TROPONIN I (HIGH SENSITIVITY)    EKG EKG Interpretation  Date/Time:  Friday Jun 06 2019 13:16:46 EDT Ventricular Rate:  116 PR Interval:  136 QRS Duration: 68 QT Interval:  314 QTC Calculation: 436 R Axis:   97 Text Interpretation: Sinus tachycardia Right atrial enlargement Anterolateral infarct , age undetermined Abnormal ECG Confirmed by Quintella Reichert [phone]) on 06/06/2019 5:54:42 PM   Radiology DG Chest 2 View  Result Date: 06/06/2019 CLINICAL DATA:  Productive cough, recent URI EXAM: CHEST - 2 VIEW COMPARISON:  05/01/2019 FINDINGS: Cardiomediastinal contours and hilar structures are normal accounting for postoperative changes of median sternotomy and unchanged from prior exam. Lungs are clear. No pleural effusion. Visualized skeletal structures without acute process. IMPRESSION: No acute cardiopulmonary disease. Electronically Signed   By: Zetta Bills M.D.   On: 06/06/2019 13:59   CT Angio Chest PE W/Cm &/Or Wo Cm  Result Date: 06/06/2019 CLINICAL DATA:  Shortness  of breath EXAM: CT ANGIOGRAPHY CHEST WITH CONTRAST TECHNIQUE: Multidetector CT imaging of the chest was performed using the standard protocol during bolus administration of intravenous contrast. Multiplanar CT image reconstructions and MIPs were obtained to evaluate  the vascular anatomy. CONTRAST:  34mL OMNIPAQUE IOHEXOL 350 MG/ML SOLN COMPARISON:  11/24/2018 FINDINGS: Cardiovascular: Thoracic aorta shows no aneurysmal dilatation or dissection. Coronary calcifications are seen. Pulmonary artery shows a normal branching pattern without filling defect to suggest pulmonary embolism. Changes of prior coronary bypass grafting are noted. Mediastinum/Nodes: Esophagus is within normal limits. No sizable hilar or mediastinal adenopathy is noted. Thoracic inlet is within normal limits. Lungs/Pleura: Lungs are well aerated bilaterally. Mild bronchial thickening is seen bilaterally. This likely represents a degree of bronchitis. No focal infiltrate or sizable effusion is seen. No parenchymal nodule is noted. Upper Abdomen: No acute abnormality. Musculoskeletal: Degenerative changes of the thoracic spine are noted. No acute bony abnormality noted. Review of the MIP images confirms the above findings. IMPRESSION: Changes of mild bronchitis. No evidence of pulmonary embolism. Electronically Signed   By: Inez Catalina M.D.   On: 06/06/2019 22:33    Procedures Procedures (including critical care time)  Medications Ordered in ED Medications  guaiFENesin (MUCINEX) 12 hr tablet 600 mg (has no administration in time range)  iohexol (OMNIPAQUE) 350 MG/ML injection 100 mL (100 mLs Intravenous Contrast Given 06/06/19 2204)    ED Course  I have reviewed the triage vital signs and the nursing notes.  Pertinent labs & imaging results that were available during my care of the patient were reviewed by me and considered in my medical decision making (see chart for details).    MDM Rules/Calculators/A&P                     Patient here for evaluation of cough, shortness of breath and temperature to 100.5 at home. She is non-toxic appearing on evaluation with no respiratory distress. Presentation is not consistent with status asthmaticus, ACS, dissection. D dimer was mildly elevated and a  CTA was obtained. CT is negative for PE or pneumonia. Discussed with patient findings of studies. Discussed with patient home care for bronchitis. Discussed outpatient follow-up.  Final Clinical Impression(s) / ED Diagnoses Final diagnoses:  Bronchitis    Rx / DC Orders ED Discharge Orders    None       Quintella Reichert, MD 06/06/19 2254

## 2019-06-07 NOTE — ED Notes (Addendum)
CBG check per MD verbal order. Per MD, check as often as patient does it at home.

## 2019-06-07 NOTE — ED Notes (Addendum)
CBG recheck per MD verbal order. Per MD, check as often as patient does it at home.

## 2019-06-11 ENCOUNTER — Ambulatory Visit: Payer: Self-pay | Attending: Nurse Practitioner | Admitting: Licensed Clinical Social Worker

## 2019-06-11 ENCOUNTER — Other Ambulatory Visit: Payer: Self-pay

## 2019-06-11 DIAGNOSIS — F411 Generalized anxiety disorder: Secondary | ICD-10-CM

## 2019-06-11 DIAGNOSIS — F331 Major depressive disorder, recurrent, moderate: Secondary | ICD-10-CM

## 2019-06-16 ENCOUNTER — Telehealth: Payer: Self-pay | Admitting: Cardiology

## 2019-06-16 NOTE — Telephone Encounter (Signed)
Left message for pt to call back and set up f/u appt post bypass

## 2019-06-18 ENCOUNTER — Ambulatory Visit: Payer: Self-pay | Admitting: Licensed Clinical Social Worker

## 2019-06-22 NOTE — BH Specialist Note (Signed)
Integrated Behavioral Health Visit via Telemedicine (Telephone)  05/29/2019 [name] [id number]   Session Start time: 3:00 PM  Session End time: 3:30 PM Total time: 30  Referring Provider: NP Raul Del Type of Visit: Telephonic Patient location: Home [facility] Provider location: Office All persons participating in visit: LCSW and Patient  Confirmed patient's address: Yes  Confirmed patient's phone number: Yes  Any changes to demographics: No   Confirmed patient's insurance: Yes  Any changes to patient's insurance: No   Discussed confidentiality: Yes    The following statements were read to the patient and/or legal guardian that are established with the [facility] Provider.  "The purpose of this phone visit is to provide behavioral health care while limiting exposure to the coronavirus (COVID19).  There is a possibility of technology failure and discussed alternative modes of communication if that failure occurs."  "By engaging in this telephone visit, you consent to the provision of healthcare.  Additionally, you authorize for your insurance to be billed for the services provided during this telephone visit."   Patient and/or legal guardian consented to telephone visit: Yes   PRESENTING CONCERNS: Patient and/or family reports the following symptoms/concerns: Pt reports difficulty managing anxiety and depression symptoms Duration of problem: Ongoing; Severity of problem: moderate  STRENGTHS (Protective Factors/Coping Skills): Pt has good insight Pt has desire to change Pt is participating in medication management  GOALS ADDRESSED: Patient will: 1.  Reduce symptoms of: anxiety and depression  2.  Increase knowledge and/or ability of: self-management skills  3.  Demonstrate ability to: Increase healthy adjustment to current life circumstances and Increase adequate support systems for patient/family  INTERVENTIONS: Interventions utilized:  Supportive  Counseling Standardized Assessments completed: Not Needed  ASSESSMENT: Patient currently experiencing depression and anxiety symptoms triggered by difficulty managing health.   Patient may benefit from continued medication management and psychotherapy.  PLAN: 1. Follow up with behavioral health clinician on : 06/05/2019 2. Behavioral recommendations: Utilize strategies discussed 3. Referral(s): [facility] (In Clinic)  Jennifer Hogan, Jennifer Hogan 06/22/2019 10:33 AM

## 2019-06-22 NOTE — BH Specialist Note (Signed)
Integrated Behavioral Health Visit via Telemedicine (Telephone)  06/05/2019 Jennifer Hogan [id number]   Session Start time: 3:00 PM  Session End time: 3:50 PM Total time: 15   Referring Provider: NP Raul Del Type of Visit: Telephonic Patient location: Home [facility] Provider location: Office3 All persons participating in visit: LCSW and Patient  Confirmed patient's address: Yes  Confirmed patient's phone number: Yes  Any changes to demographics: No   Confirmed patient's insurance: Yes  Any changes to patient's insurance: No   Discussed confidentiality: Yes    The following statements were read to the patient and/or legal guardian that are established with the [facility] Provider.  "The purpose of this phone visit is to provide behavioral health care while limiting exposure to the coronavirus (COVID19).  There is a possibility of technology failure and discussed alternative modes of communication if that failure occurs."  "By engaging in this telephone visit, you consent to the provision of healthcare.  Additionally, you authorize for your insurance to be billed for the services provided during this telephone visit."   Patient and/or legal guardian consented to telephone visit: Yes   PRESENTING CONCERNS: Patient and/or family reports the following symptoms/concerns: Pt reports difficulty in management of depression and anxiety symptoms Duration of problem: Ongoing; Severity of problem: moderate  STRENGTHS (Protective Factors/Coping Skills): Pt has good insight Pt has desire to change Pt is participating in medication management  GOALS ADDRESSED: Patient will: 1.  Reduce symptoms of: anxiety and depression  2.  Increase knowledge and/or ability of: self-management skills  3.  Demonstrate ability to: Increase healthy adjustment to current life circumstances and Increase adequate support systems for patient/family  INTERVENTIONS: Interventions utilized:  Behavioral  Activation, Brief CBT and Supportive Counseling Standardized Assessments completed: Not Needed  ASSESSMENT: Patient currently experiencing depression and anxiety symptoms triggered by difficulty managing health and financial strain.   Patient may benefit from continued medication management and psychotherapy. Pt identified how childhood trauma has impacted decision-making skills. Pt was successful in identifying short and long-term goals.  PLAN: 1. Follow up with behavioral health clinician on : 06/11/19  2. Behavioral recommendations: Utilize strategies discussed and continue with medication management 3. Referral(s): [place] (In Clinic)  Rebekah Chesterfield, [place] 06/22/2019 10:39 AM

## 2019-06-22 NOTE — BH Specialist Note (Signed)
Integrated Behavioral Health Visit via Telemedicine (Telephone)  06/11/19 Jennifer Hogan [id number]   Session Start time: 3:00 PM  Session End time: 3:45 PM Total time: 44   Referring Provider: NP Raul Del Type of Visit: Telephonic Patient location: Home [facility] Provider location: Office All persons participating in visit: LCSW and Patient  Confirmed patient's address: Yes  Confirmed patient's phone number: Yes  Any changes to demographics: No   Confirmed patient's insurance: Yes  Any changes to patient's insurance: No   Discussed confidentiality: Yes    The following statements were read to the patient and/or legal guardian that are established with the [facility] Provider.  "The purpose of this phone visit is to provide behavioral health care while limiting exposure to the coronavirus (COVID19).  There is a possibility of technology failure and discussed alternative modes of communication if that failure occurs."  "By engaging in this telephone visit, you consent to the provision of healthcare.  Additionally, you authorize for your insurance to be billed for the services provided during this telephone visit."   Patient and/or legal guardian consented to telephone visit: Yes   PRESENTING CONCERNS: Patient and/or family reports the following symptoms/concerns: Pt reports difficulty managing mental health symptoms Duration of problem: Ongoing; Severity of problem: moderate  STRENGTHS (Protective Factors/Coping Skills): Pt has good insight Pt has desire to change Pt is participating in medication management  GOALS ADDRESSED: Patient will: 1.  Reduce symptoms of: anxiety and depression  2.  Increase knowledge and/or ability of: self-management skills  3.  Demonstrate ability to: Increase healthy adjustment to current life circumstances and Increase adequate support systems for patient/family  INTERVENTIONS: Interventions utilized:  Brief CBT and Supportive  Counseling Standardized Assessments completed: Not Needed  ASSESSMENT: Patient currently experiencing depression and anxiety symptoms triggered by difficulty managing health.  Patient may benefit from continued medication management and psychotherapy.  PLAN: 1. Follow up with behavioral health clinician on : 06/18/2019 2. Behavioral recommendations: Utilize strategies discussed and continue to comply with medication management 3. Referral(s): [place] (In Clinic)  Rebekah Chesterfield, [place] 06/22/2019 10:43 AM

## 2019-06-26 ENCOUNTER — Other Ambulatory Visit: Payer: Self-pay

## 2019-06-26 ENCOUNTER — Other Ambulatory Visit: Payer: Self-pay | Admitting: Nurse Practitioner

## 2019-06-26 ENCOUNTER — Ambulatory Visit: Payer: Self-pay | Attending: Nurse Practitioner | Admitting: Licensed Clinical Social Worker

## 2019-06-26 DIAGNOSIS — F331 Major depressive disorder, recurrent, moderate: Secondary | ICD-10-CM

## 2019-06-26 DIAGNOSIS — J9801 Acute bronchospasm: Secondary | ICD-10-CM

## 2019-06-26 DIAGNOSIS — F411 Generalized anxiety disorder: Secondary | ICD-10-CM

## 2019-06-26 MED FILL — ?BENZONATATE 200MG CAPSULES: 200 | 30 days supply | Qty: 60 | Fill #1

## 2019-06-26 MED FILL — ?HUMALOG 100 UNITS/ML VIAL: 100 | 28 days supply | Qty: 10 | Fill #1

## 2019-06-27 MED FILL — ALBUTEROL SULFATE HFA 108 (: 108 (90 BAS | 25 days supply | Qty: 9 | Fill #0

## 2019-07-03 ENCOUNTER — Ambulatory Visit: Payer: Self-pay | Attending: Nurse Practitioner | Admitting: Licensed Clinical Social Worker

## 2019-07-03 ENCOUNTER — Other Ambulatory Visit: Payer: Self-pay

## 2019-07-04 NOTE — BH Specialist Note (Signed)
Integrated Behavioral Health Visit via Telemedicine (Telephone)  06/26/2019 Jennifer Hogan [id number]   Session Start time: 3:40 PM  Session End time: 4:15 PM Total time: 35   Referring Provider: NP Raul Del Type of Visit: Telephonic Patient location: Home [facility] Provider location: Office All persons participating in visit: LCSW and patient  Confirmed patient's address: Yes  Confirmed patient's phone number: Yes  Any changes to demographics: No   Confirmed patient's insurance: Yes  Any changes to patient's insurance: No   Discussed confidentiality: Yes    The following statements were read to the patient and/or legal guardian that are established with the [facility] Provider.  "The purpose of this phone visit is to provide behavioral health care while limiting exposure to the coronavirus (COVID19).  There is a possibility of technology failure and discussed alternative modes of communication if that failure occurs."  "By engaging in this telephone visit, you consent to the provision of healthcare.  Additionally, you authorize for your insurance to be billed for the services provided during this telephone visit."   Patient and/or legal guardian consented to telephone visit: Yes   PRESENTING CONCERNS: Patient and/or family reports the following symptoms/concerns: Patient reports ongoing symptoms of depression anxiety Duration of problem: Ongoing; Severity of problem: moderate  STRENGTHS (Protective Factors/Coping Skills): Pt has good insight Pt has desire to change Pt is participating in medication management  GOALS ADDRESSED: Patient will: 1.  Reduce symptoms of: anxiety and depression  2.  Increase knowledge and/or ability of: self-management skills  3.  Demonstrate ability to: Increase healthy adjustment to current life circumstances  INTERVENTIONS: Interventions utilized:  Mindfulness or Relaxation Training and Supportive Counseling Standardized Assessments  completed: Not Needed  ASSESSMENT: Patient currently experiencing symptoms of anxiety and depression.   Patient may benefit from continued medication management and psychotherapy.  Patient has been utilizing when half breathing strategies to assist with mindfulness.  Discussion of how avoidance has impacted previous decision-making skills and aspects of healthy relationships.  PLAN: 1. Follow up with behavioral health clinician on : 07/03/2019 2. Behavioral recommendations: Strategies discussed and continue with compliance of medication management 3. Referral(s): [facility] (In Clinic)  Rebekah [place], [place] 07/04/2019 10:42 PM

## 2019-07-08 ENCOUNTER — Telehealth: Payer: MEDICAID | Admitting: Emergency Medicine

## 2019-07-08 DIAGNOSIS — R300 Dysuria: Secondary | ICD-10-CM

## 2019-07-08 MED ORDER — CEPHALEXIN 500 MG PO CAPS: 500.0000 mg | ORAL_CAPSULE | Freq: Two times a day (BID) | ORAL | 0 refills | Status: DC

## 2019-07-08 NOTE — Progress Notes (Signed)
We are sorry that you are not feeling well.  Here is how we plan to help! ° °Based on what you shared with me it looks like you most likely have a simple urinary tract infection. ° °A UTI (Urinary Tract Infection) is a bacterial infection of the bladder. ° °Most cases of urinary tract infections are simple to treat but a key part of your care is to encourage you to drink plenty of fluids and watch your symptoms carefully. °I have prescribed Keflex 500 mg twice a day for 7 days.  Your symptoms should gradually improve. Call us if the burning in your urine worsens, you develop worsening fever, back pain or pelvic pain or if your symptoms do not resolve after completing the antibiotic. ° °Urinary tract infections can be prevented by drinking plenty of water to keep your body hydrated.  Also be sure when you wipe, wipe from front to back and don't hold it in!  If possible, empty your bladder every 4 hours. ° °Your e-visit answers were reviewed by a board certified advanced clinical practitioner to complete your personal care plan.  Depending on the condition, your plan could have included both over the counter or prescription medications. ° °If there is a problem please reply  once you have received a response from your provider. ° °Your safety is important to us.  If you have drug allergies check your prescription carefully.   ° °You can use MyChart to ask questions about today’s visit, request a non-urgent call back, or ask for a work or school excuse for 24 hours related to this e-Visit. If it has been greater than 24 hours you will need to follow up with your provider, or enter a new e-Visit to address those concerns. ° ° °You will get an e-mail in the next two days asking about your experience.  I hope that your e-visit has been valuable and will speed your recovery. Thank you for using e-visits. ° ° °Approximately 5 minutes was used in reviewing the patient's chart, questionnaire, prescribing medications, and  documentation. ° °

## 2019-07-09 ENCOUNTER — Other Ambulatory Visit: Payer: Self-pay

## 2019-07-09 ENCOUNTER — Ambulatory Visit: Payer: Self-pay | Attending: Nurse Practitioner | Admitting: Licensed Clinical Social Worker

## 2019-07-13 ENCOUNTER — Encounter (HOSPITAL_COMMUNITY): Payer: Self-pay

## 2019-07-13 ENCOUNTER — Telehealth (HOSPITAL_BASED_OUTPATIENT_CLINIC_OR_DEPARTMENT_OTHER): Payer: Self-pay | Admitting: *Deleted

## 2019-07-13 ENCOUNTER — Emergency Department (HOSPITAL_COMMUNITY)
Admission: EM | Admit: 2019-07-13 | Discharge: 2019-07-13 | Disposition: A | Discharge status: Home / Self Care | Payer: Self-pay | Attending: Emergency Medicine | Admitting: Emergency Medicine

## 2019-07-13 ENCOUNTER — Emergency Department (HOSPITAL_COMMUNITY): Payer: Self-pay

## 2019-07-13 ENCOUNTER — Other Ambulatory Visit: Payer: Self-pay

## 2019-07-13 DIAGNOSIS — R509 Fever, unspecified: Secondary | ICD-10-CM | POA: Insufficient documentation

## 2019-07-13 DIAGNOSIS — I2510 Atherosclerotic heart disease of native coronary artery without angina pectoris: Secondary | ICD-10-CM | POA: Insufficient documentation

## 2019-07-13 DIAGNOSIS — I5022 Chronic systolic (congestive) heart failure: Secondary | ICD-10-CM | POA: Insufficient documentation

## 2019-07-13 DIAGNOSIS — Z20822 Contact with and (suspected) exposure to covid-19: Secondary | ICD-10-CM | POA: Insufficient documentation

## 2019-07-13 DIAGNOSIS — I252 Old myocardial infarction: Secondary | ICD-10-CM | POA: Insufficient documentation

## 2019-07-13 DIAGNOSIS — E039 Hypothyroidism, unspecified: Secondary | ICD-10-CM | POA: Insufficient documentation

## 2019-07-13 DIAGNOSIS — Z951 Presence of aortocoronary bypass graft: Secondary | ICD-10-CM | POA: Insufficient documentation

## 2019-07-13 DIAGNOSIS — E1043 Type 1 diabetes mellitus with diabetic autonomic (poly)neuropathy: Secondary | ICD-10-CM | POA: Insufficient documentation

## 2019-07-13 DIAGNOSIS — F419 Anxiety disorder, unspecified: Secondary | ICD-10-CM

## 2019-07-13 DIAGNOSIS — Z7982 Long term (current) use of aspirin: Secondary | ICD-10-CM | POA: Insufficient documentation

## 2019-07-13 DIAGNOSIS — R1012 Left upper quadrant pain: Secondary | ICD-10-CM | POA: Insufficient documentation

## 2019-07-13 DIAGNOSIS — Z9641 Presence of insulin pump (external) (internal): Secondary | ICD-10-CM | POA: Insufficient documentation

## 2019-07-13 DIAGNOSIS — Z7902 Long term (current) use of antithrombotics/antiplatelets: Secondary | ICD-10-CM | POA: Insufficient documentation

## 2019-07-13 DIAGNOSIS — R0789 Other chest pain: Secondary | ICD-10-CM | POA: Insufficient documentation

## 2019-07-13 DIAGNOSIS — E860 Dehydration: Secondary | ICD-10-CM | POA: Insufficient documentation

## 2019-07-13 DIAGNOSIS — K3184 Gastroparesis: Secondary | ICD-10-CM | POA: Insufficient documentation

## 2019-07-13 DIAGNOSIS — E109 Type 1 diabetes mellitus without complications: Secondary | ICD-10-CM | POA: Insufficient documentation

## 2019-07-13 DIAGNOSIS — Z79899 Other long term (current) drug therapy: Secondary | ICD-10-CM | POA: Insufficient documentation

## 2019-07-13 DIAGNOSIS — E876 Hypokalemia: Secondary | ICD-10-CM | POA: Insufficient documentation

## 2019-07-13 DIAGNOSIS — Z955 Presence of coronary angioplasty implant and graft: Secondary | ICD-10-CM | POA: Insufficient documentation

## 2019-07-13 DIAGNOSIS — A4902 Methicillin resistant Staphylococcus aureus infection, unspecified site: Secondary | ICD-10-CM | POA: Insufficient documentation

## 2019-07-13 LAB — LACTIC ACID, PLASMA
Lactic Acid, Venous: 2.3 mmol/L (ref 0.5–1.9)
Lactic Acid, Venous: 1.7 mmol/L (ref 0.5–1.9)

## 2019-07-13 LAB — BLOOD CULTURE ID PANEL (REFLEXED)

## 2019-07-13 LAB — COMPREHENSIVE METABOLIC PANEL
ALT: 40 U/L (ref 0–44)
AST: 77 U/L — ABNORMAL HIGH (ref 15–41)
Albumin: 4.2 g/dL (ref 3.5–5.0)
Alkaline Phosphatase: 84 U/L (ref 38–126)
Anion gap: 16 — ABNORMAL HIGH (ref 5–15)
BUN: 24 mg/dL — ABNORMAL HIGH (ref 6–20)
CO2: 18 mmol/L — ABNORMAL LOW (ref 22–32)
Calcium: 9.2 mg/dL (ref 8.9–10.3)
Chloride: 100 mmol/L (ref 98–111)
Creatinine, Ser: 1.07 mg/dL — ABNORMAL HIGH (ref 0.44–1.00)
GFR calc Af Amer: >60 mL/min (ref >60)
GFR calc non Af Amer: >60 mL/min (ref >60)
Glucose, Bld: 141 mg/dL — ABNORMAL HIGH (ref 70–99)
Potassium: 3.4 mmol/L — ABNORMAL LOW (ref 3.5–5.1)
Sodium: 134 mmol/L — ABNORMAL LOW (ref 135–145)
Total Bilirubin: 0.7 mg/dL (ref 0.3–1.2)
Total Protein: 8.1 g/dL (ref 6.5–8.1)

## 2019-07-13 LAB — CBC WITH DIFFERENTIAL/PLATELET
Abs Immature Granulocytes: 0.05 10*3/uL (ref 0.00–0.07)
Basophils Absolute: 0 10*3/uL (ref 0.0–0.1)
Basophils Relative: 0 %
Eosinophils Absolute: 0 10*3/uL (ref 0.0–0.5)
Eosinophils Relative: 0 %
HCT: 38.6 % (ref 36.0–46.0)
Hemoglobin: 12.6 g/dL (ref 12.0–15.0)
Immature Granulocytes: 0 %
Lymphocytes Relative: 12 %
Lymphs Abs: 1.7 10*3/uL (ref 0.7–4.0)
MCH: 28.3 pg (ref 26.0–34.0)
MCHC: 32.6 g/dL (ref 30.0–36.0)
MCV: 86.7 fL (ref 80.0–100.0)
Monocytes Absolute: 0.8 10*3/uL (ref 0.1–1.0)
Monocytes Relative: 6 %
Neutro Abs: 11.3 10*3/uL — ABNORMAL HIGH (ref 1.7–7.7)
Neutrophils Relative %: 82 %
Platelets: 527 10*3/uL — ABNORMAL HIGH (ref 150–400)
RBC: 4.45 MIL/uL (ref 3.87–5.11)
RDW: 15.4 % (ref 11.5–15.5)
WBC: 13.8 10*3/uL — ABNORMAL HIGH (ref 4.0–10.5)
nRBC: 0 % (ref 0.0–0.2)

## 2019-07-13 LAB — CBG MONITORING, ED
Glucose-Capillary: 111 mg/dL — ABNORMAL HIGH (ref 70–99)
Glucose-Capillary: 112 mg/dL — ABNORMAL HIGH (ref 70–99)
Glucose-Capillary: 124 mg/dL — ABNORMAL HIGH (ref 70–99)
Glucose-Capillary: 158 mg/dL — ABNORMAL HIGH (ref 70–99)

## 2019-07-13 LAB — BASIC METABOLIC PANEL
Anion gap: 10 (ref 5–15)
BUN: 20 mg/dL (ref 6–20)
CO2: 21 mmol/L — ABNORMAL LOW (ref 22–32)
Calcium: 8.1 mg/dL — ABNORMAL LOW (ref 8.9–10.3)
Chloride: 103 mmol/L (ref 98–111)
Creatinine, Ser: 0.98 mg/dL (ref 0.44–1.00)
GFR calc Af Amer: >60 mL/min (ref >60)
GFR calc non Af Amer: >60 mL/min (ref >60)
Glucose, Bld: 129 mg/dL — ABNORMAL HIGH (ref 70–99)
Potassium: 3.1 mmol/L — ABNORMAL LOW (ref 3.5–5.1)
Sodium: 134 mmol/L — ABNORMAL LOW (ref 135–145)

## 2019-07-13 LAB — URINALYSIS, ROUTINE W REFLEX MICROSCOPIC
Bacteria, UA: NONE SEEN
Bilirubin Urine: NEGATIVE
Glucose, UA: >=500 mg/dL — AB
Ketones, ur: 80 mg/dL — AB
Nitrite: NEGATIVE
Protein, ur: 100 mg/dL — AB
Specific Gravity, Urine: 1.026 (ref 1.005–1.030)
pH: 5 (ref 5.0–8.0)

## 2019-07-13 LAB — SARS CORONAVIRUS 2 BY RT PCR (HOSPITAL ORDER, PERFORMED IN ~~LOC~~ HOSPITAL LAB): SARS Coronavirus 2: NEGATIVE

## 2019-07-13 LAB — APTT: aPTT: 26 seconds (ref 24–36)

## 2019-07-13 LAB — PROTIME-INR
INR: 1.1 (ref 0.8–1.2)
Prothrombin Time: 13.5 seconds (ref 11.4–15.2)

## 2019-07-13 MED ORDER — LORAZEPAM 0.5 MG PO TABS
0.5000 mg | ORAL_TABLET | Freq: Three times a day (TID) | ORAL | 0 refills | Status: DC | PRN
Start: 2019-07-13 — End: 2020-10-06

## 2019-07-13 MED ORDER — POTASSIUM CHLORIDE CRYS ER 20 MEQ PO TBCR
40.0000 meq | EXTENDED_RELEASE_TABLET | Freq: Once | ORAL | Status: AC
  Administered 2019-07-13: 40 meq via ORAL
  Filled 2019-07-13: qty 2

## 2019-07-13 MED ORDER — LACTATED RINGERS IV BOLUS (SEPSIS)
1000.0000 mL | Freq: Once | INTRAVENOUS | Status: AC
  Administered 2019-07-13: 1000 mL via INTRAVENOUS

## 2019-07-13 MED ORDER — LORAZEPAM 2 MG/ML IJ SOLN
1.0000 mg | Freq: Once | INTRAMUSCULAR | Status: AC
  Administered 2019-07-13: 1 mg via INTRAVENOUS
  Filled 2019-07-13: qty 1

## 2019-07-13 MED ORDER — SODIUM CHLORIDE 0.9 % IV SOLN
2.0000 g | Freq: Once | INTRAVENOUS | Status: AC
  Administered 2019-07-13: 2 g via INTRAVENOUS
  Filled 2019-07-13: qty 2

## 2019-07-13 MED ORDER — METRONIDAZOLE IN NACL 5-0.79 MG/ML-% IV SOLN
500.0000 mg | Freq: Once | INTRAVENOUS | Status: AC
  Administered 2019-07-13: 500 mg via INTRAVENOUS
  Filled 2019-07-13: qty 100

## 2019-07-13 MED ORDER — LACTATED RINGERS IV BOLUS (SEPSIS)
250.0000 mL | Freq: Once | INTRAVENOUS | Status: AC
  Administered 2019-07-13: 250 mL via INTRAVENOUS

## 2019-07-13 MED ORDER — FENTANYL CITRATE (PF) 100 MCG/2ML IJ SOLN: 50.0000 ug | Freq: Once | INTRAMUSCULAR | Status: DC

## 2019-07-13 MED ORDER — ACETAMINOPHEN 325 MG PO TABS
650.0000 mg | ORAL_TABLET | Freq: Once | ORAL | Status: AC
  Administered 2019-07-13: 650 mg via ORAL
  Filled 2019-07-13: qty 2

## 2019-07-13 MED ORDER — VANCOMYCIN HCL IN DEXTROSE 1-5 GM/200ML-% IV SOLN
1000.0000 mg | Freq: Once | INTRAVENOUS | Status: AC
  Administered 2019-07-13: 1000 mg via INTRAVENOUS
  Filled 2019-07-13: qty 200

## 2019-07-13 NOTE — ED Notes (Signed)
This RN wasted 1 mg of Ativan with Rolene Arbour, RN. This RN was unable to waste properly in the medication dispense due to technical issues. Refer to Rolene Arbour, RN regarding any issues.

## 2019-07-13 NOTE — ED Triage Notes (Signed)
Pt BIB [facility] from home with Mult. Complaints.   Pt Has been having intermittent CP for several days along with hyperglycemia over the past several days.   Pt also feels anxious and has had decreased urine output over the past several days.   Hx of Cardiac Bypass in Oct. Of 2020.  Pt given 1000 ml of NS with EMS for tachycardia (140s)  173/84 BP 120 HR CBG 180 RR 30

## 2019-07-13 NOTE — ED Provider Notes (Signed)
[facility] EMERGENCY DEPARTMENT Provider Note   CSN: [id number] Arrival date & time: 07/13/19  0007     History Chief Complaint  Patient presents with  . Chest Pain  . Hyperglycemia  . Low Urine Output  . Anxiety  Level 5 caveat due to acuity of condition  Jennifer Hogan is a 49 y.o. female.  The history is provided by the patient.  Hyperglycemia Severity:  Severe Onset quality:  Gradual Duration:  4 days Timing:  Constant Progression:  Worsening Chronicity:  New Context: insulin pump use   Relieved by:  Nothing Associated symptoms: abdominal pain, dehydration and vomiting   Anxiety Associated symptoms include abdominal pain.  Patient with history of diabetes with insulin pump, CAD, depression, presents with vomiting.  Patient reports that about 4 days ago she began vomiting.  She is concerned that she is going into DKA.  She also reports feeling dehydrated and abdominal pain.  She also reports chest tightness.  She reports body aches.  No new cough.     Past Medical History:  Diagnosis Date  . Anemia   . Anxiety   . Coronary artery disease    a. s/pPTCA DES x3 in the LAD (ostial DES placed, mid DES placed, distal DES placed)and PTCA/DES x1to intermediate branch 02/2017. b. ACS 02/08/18 with LCx stent placement.  . Depression   . Diabetes mellitus    type 1, on insulin pump  . DKA (diabetic ketoacidoses) ([place]) 12/31/2017  . Elevated platelet count   . High cholesterol   . Hypothyroidism   . Ischemic cardiomyopathy    a. EF low-normal with basal inferior akinesis, basal septal hypokinesis by echo 01/2018.    Patient Active Problem List   Diagnosis Date Noted  . Emesis 05/01/2019  . AKI (acute kidney injury) ([place]) 05/01/2019  . Severe hyperglycemia due to diabetes mellitus ([place]) 03/15/2019  . Pleural effusion 12/09/2018  . Abdominal pain 11/28/2018  . Intractable nausea and vomiting 11/28/2018  . Diarrhea 11/28/2018  . DKA (diabetic  ketoacidoses) ([place]) 11/25/2018  . Sinus tachycardia 11/24/2018  . Chronic systolic heart failure ([place])   . Cardiomyopathy ([place])   . Hypotension 10/29/2018  . DKA, type 1 ([place]) 05/24/2018  . Moderate episode of recurrent major depressive disorder ([place]) 03/27/2018  . Epigastric pain   . Hyperglycemia due to type 1 diabetes mellitus ([place]) 02/14/2018  . Hyperlipidemia LDL goal <70 02/14/2018  . S/P drug eluting coronary stent placement   . ACS (acute coronary syndrome) ([place]) 02/08/2018  . ST elevation myocardial infarction (STEMI) ([place]) 02/08/2018  . DKA (diabetic ketoacidosis) ([place]) 12/31/2017  . Marijuana abuse 12/31/2017  . Gastroparesis 12/06/2017  . Gastroparesis due to DM ([place]) 03/28/2017  . Coronary artery disease 03/28/2017  . Ischemic cardiomyopathy 03/28/2017  . Pure hypercholesterolemia   . NSTEMI (non-ST elevated myocardial infarction) ([place])   . Anemia 03/17/2017  . Allergic state 08/07/2015  . Vitiligo 07/09/2012  . Type 1 diabetes mellitus ([place]) 07/09/2012  . Depression 12/04/2010  . Non-intractable vomiting with nausea 12/04/2010  . Hypothyroidism 12/04/2010    Past Surgical History:  Procedure Laterality Date  . APPENDECTOMY    . CORONARY ARTERY BYPASS GRAFT N/A 11/04/2018   Procedure: CORONARY ARTERY BYPASS GRAFTING (CABG), ON PUMP, TIMES THREE, USING LEFT AND RIGHT INTERNAL MAMMARY ARTERIES;  Surgeon: Wonda Olds, MD;  Location: [facility];  Service: Open Heart Surgery;  Laterality: N/A;  . CORONARY STENT INTERVENTION N/A 03/20/2017   Procedure: DES x 3 LAD,  DES OM; Surgeon: Burnell Blanks, MD;  Location: [facility];  Service: Cardiovascular;  Laterality: N/A;  . CORONARY/GRAFT ACUTE MI REVASCULARIZATION N/A 02/08/2018   Procedure: Coronary/Graft Acute MI Revascularization;  Surgeon: Belva Crome, MD;  Location: [facility];  Service: Cardiovascular;  Laterality: N/A;  . LEFT HEART CATH AND CORONARY ANGIOGRAPHY N/A 03/20/2017   Procedure:  LEFT HEART CATH AND CORONARY ANGIOGRAPHY;  Surgeon: Burnell Blanks, MD;  Location: [facility];  Service: Cardiovascular;  Laterality: N/A;  . LEFT HEART CATH AND CORONARY ANGIOGRAPHY N/A 02/08/2018   Procedure: LEFT HEART CATH AND CORONARY ANGIOGRAPHY;  Surgeon: Belva Crome, MD;  Location: [facility];  Service: Cardiovascular;  Laterality: N/A;  . LEFT HEART CATH AND CORONARY ANGIOGRAPHY N/A 10/29/2018   Procedure: LEFT HEART CATH AND CORONARY ANGIOGRAPHY;  Surgeon: Burnell Blanks, MD;  Location: [facility];  Service: Cardiovascular;  Laterality: N/A;  . TEE WITHOUT CARDIOVERSION N/A 11/04/2018   Procedure: TRANSESOPHAGEAL ECHOCARDIOGRAM (TEE);  Surgeon: Wonda Olds, MD;  Location: [facility];  Service: Open Heart Surgery;  Laterality: N/A;     OB History    Gravida  0   Para  0   Term  0   Preterm  0   AB  0   Living  0     SAB  0   TAB  0   Ectopic  0   Multiple  0   Live Births  0           Family History  Problem Relation Age of Onset  . Coronary artery disease Father   . Congestive Heart Failure Father   . Asthma Father   . Cancer Mother        T cell lymphoma    Social History   Tobacco Use  . Smoking status: Never Smoker  . Smokeless tobacco: Never Used  . Tobacco comment: Use marijuana  Vaping Use  . Vaping Use: Never used  Substance Use Topics  . Alcohol use: No    Alcohol/week: 0.0 standard drinks  . Drug use: Not Currently    Types: Marijuana    Comment: last use last week, occaisonal marijuana use    Home Medications Prior to Admission medications   Medication Sig Start Date End Date Taking? Authorizing Provider  acetaminophen (TYLENOL) 325 MG tablet Take 650 mg by mouth daily as needed (couch/pain/fever).    [provider]  albuterol (VENTOLIN HFA) 108 (90 Base) MCG/ACT inhaler INHALE 2 PUFFS INTO THE LUNGS EVERY 6 (SIX) HOURS AS NEEDED FOR WHEEZING OR SHORTNESS OF BREATH. 06/26/19  07/26/19  [name], NP  aspirin 81 MG EC tablet Take 1 tablet (81 mg total) by mouth daily. 12/07/17   Amin, Jeanella Flattery, MD  atorvastatin (LIPITOR) 20 MG tablet Take 1 tablet (20 mg total) by mouth daily. Patient taking differently: Take 20 mg by mouth at bedtime.  02/19/19 02/19/20  [name], MD  b complex vitamins tablet Take 1 tablet by mouth daily.    [provider]  benzonatate (TESSALON) 200 MG capsule Take 1 capsule (200 mg total) by mouth 2 (two) times daily as needed for cough. 06/03/19   [name], NP  cephALEXin (KEFLEX) 500 MG capsule Take 1 capsule (500 mg total) by mouth 2 (two) times daily. 07/08/19   Montine Circle, PA-C  clopidogrel (PLAVIX) 75 MG tablet Take 1 tablet (75 mg total) by mouth  daily. 03/05/19   [name], MD  diclofenac Sodium (VOLTAREN) 1 % GEL Apply 2 g topically 4 (four) times daily. Patient taking differently: Apply 2 g topically daily as needed (For pain).  03/19/19   [name], NP  ferrous sulfate 325 (65 FE) MG tablet Take 325 mg by mouth See admin instructions. Take one tablet (325 mg) by mouth once or twice during menstrual cycle    [provider]  FLUoxetine (PROZAC) 40 MG capsule Take 1 capsule (40 mg total) by mouth daily. 05/06/19   [name], NP  fluticasone (FLONASE) 50 MCG/ACT nasal spray Place 1 spray into both nostrils at bedtime.    [provider]  hydrOXYzine (ATARAX/VISTARIL) 25 MG tablet Take 1 tablet (25 mg total) by mouth 3 (three) times daily as needed. Patient taking differently: Take 25 mg by mouth 3 (three) times daily as needed for anxiety.  03/19/19   [name], NP  insulin aspart (NOVOLOG) 100 UNIT/ML injection 35 units per insulin pump daily; MUST KEEP APPT FOR FUTURE REFILLS TO BE AUTHORIZED Patient not taking: Reported on 06/06/2019 03/24/19   Renato Shin, MD  Insulin Human (INSULIN PUMP) SOLN Inject into the skin continuous. Humalog    [provider]    insulin lispro (HUMALOG) 100 UNIT/ML injection Inject 0.35 mLs (35 Units total) into the skin See admin instructions. 35 units daily continues insulin pump Patient not taking: Reported on 06/06/2019 05/20/19   Renato Shin, MD  ipratropium (ATROVENT) 0.06 % nasal spray Place 2 sprays into both nostrils 2 (two) times daily as needed for rhinitis.    [provider]  isosorbide mononitrate (IMDUR) 30 MG 24 hr tablet Take 0.5 tablets (15 mg total) by mouth daily. 01/08/19   [name], MD  levothyroxine (EUTHYROX) 100 MCG tablet Take 1 tablet (100 mcg total) by mouth daily before breakfast. 06/03/19   [name], NP  metoCLOPramide (REGLAN) 5 MG tablet Take 1 tablet (5 mg total) by mouth every 12 (twelve) hours as needed for nausea or vomiting. 12/17/18 12/17/19  Argentina Donovan, PA-C  montelukast (SINGULAIR) 10 MG tablet Take 1 tablet (10 mg total) by mouth at bedtime. 03/19/19 06/17/19  [name], NP  nitroGLYCERIN (NITROSTAT) 0.4 MG SL tablet Place 1 tablet (0.4 mg total) under the tongue every 5 (five) minutes as needed for chest pain. 01/08/19   [name], MD  ondansetron (ZOFRAN ODT) 4 MG disintegrating tablet Take 1 tablet (4 mg total) by mouth every 8 (eight) hours as needed for nausea or vomiting. 12/17/18   Argentina Donovan, PA-C  pantoprazole (PROTONIX) 40 MG tablet Take 1 tablet (40 mg total) by mouth 2 (two) times daily as needed (acid reflux). Patient taking differently: Take 40 mg by mouth daily.  06/03/19 07/03/19  [name], NP  Phenyleph-Doxyl-DM-Aspirin (ALKA-SELTZER PLUS COLD DY/NGHT PO) Take 1 tablet by mouth at bedtime as needed (cough).    [provider]  QUEtiapine (SEROQUEL) 50 MG tablet Take 1 tablet (50 mg total) by mouth at bedtime. May increase to 2 tablets or 100 mg at bedtime after 2 weeks if needed Patient not taking: Reported on 06/03/2019 03/19/19 06/17/19  [name], NP    Allergies    Atorvastatin, Crestor  [rosuvastatin calcium], Monosodium glutamate, Peanut-containing drug products, Zetia [ezetimibe], Ciprofloxin hcl [ciprofloxacin], and Food color red [red dye]  Review of Systems   Review of Systems  Unable to perform ROS: Acuity  of condition  Gastrointestinal: Positive for abdominal pain and vomiting.    Physical Exam Updated Vital Signs BP (!) 168/107 (BP Location: Left Arm)   Pulse (!) 123   Temp (!) 101.9 F (38.8 C) (Oral)   Resp (!) 28   Ht 1.651 m (5\' 5" )   Wt 68 kg   SpO2 100%   BMI 24.96 kg/m   Physical Exam CONSTITUTIONAL: Disheveled and anxious HEAD: Normocephalic/atraumatic EYES: EOMI/PERRL ENMT: Mucous membranes dry NECK: supple no meningeal signs SPINE/BACK:entire spine nontender CV: S1/S2 noted, no murmurs/rubs/gallops noted, tachycardic LUNGS: Lungs are clear to auscultation bilaterally, no apparent distress ABDOMEN: soft, nontender, no rebound or guarding, bowel sounds noted throughout abdomen, insulin pump in place GU:no cva tenderness NEURO: Pt is awake/alert/appropriate, moves all extremitiesx4.  No facial droop.  EXTREMITIES: pulses normal/equal, full ROM SKIN: warm, color normal PSYCH: Anxious and mildly agitated  ED Results / Procedures / Treatments   Labs (all labs ordered are listed, but only abnormal results are displayed) Labs Reviewed  LACTIC ACID, PLASMA - Abnormal; Notable for the following components:      Result Value   Lactic Acid, Venous 2.3 (*)    All other components within normal limits  COMPREHENSIVE METABOLIC PANEL - Abnormal; Notable for the following components:   Sodium 134 (*)    Potassium 3.4 (*)    CO2 18 (*)    Glucose, Bld 141 (*)    BUN 24 (*)    Creatinine, Ser 1.07 (*)    AST 77 (*)    Anion gap 16 (*)    All other components within normal limits  CBC WITH DIFFERENTIAL/PLATELET - Abnormal; Notable for the following components:   WBC 13.8 (*)    Platelets 527 (*)    Neutro Abs 11.3 (*)    All other components  within normal limits  URINALYSIS, ROUTINE W REFLEX MICROSCOPIC - Abnormal; Notable for the following components:   APPearance HAZY (*)    Glucose, UA >=500 (*)    Hgb urine dipstick MODERATE (*)    Ketones, ur 80 (*)    Protein, ur 100 (*)    Leukocytes,Ua TRACE (*)    All other components within normal limits  BASIC METABOLIC PANEL - Abnormal; Notable for the following components:   Sodium 134 (*)    Potassium 3.1 (*)    CO2 21 (*)    Glucose, Bld 129 (*)    Calcium 8.1 (*)    All other components within normal limits  CBG MONITORING, ED - Abnormal; Notable for the following components:   Glucose-Capillary 158 (*)    All other components within normal limits  CBG MONITORING, ED - Abnormal; Notable for the following components:   Glucose-Capillary 124 (*)    All other components within normal limits  CBG MONITORING, ED - Abnormal; Notable for the following components:   Glucose-Capillary 112 (*)    All other components within normal limits  SARS CORONAVIRUS 2 BY RT PCR (HOSPITAL ORDER, [facility])  CULTURE, BLOOD (ROUTINE X 2)  CULTURE, BLOOD (ROUTINE X 2)  URINE CULTURE  LACTIC ACID, PLASMA  APTT  PROTIME-INR  CBG MONITORING, ED  CBG MONITORING, ED    EKG EKG Interpretation  Date/Time:  Sunday July 13 2019 00:15:42 EDT Ventricular Rate:  121 PR Interval:    QRS Duration: 71 QT Interval:  330 QTC Calculation: 469 R Axis:   88 Text Interpretation: Sinus tachycardia LAE, consider biatrial enlargement Anteroseptal infarct, old  Borderline T abnormalities, inferior leads No significant change since last tracing Confirmed by Ripley Fraise [phone]) on 07/13/2019 12:21:49 AM   Radiology DG Chest Port 1 View  Result Date: 07/13/2019 CLINICAL DATA:  Chest pain fever. EXAM: PORTABLE CHEST 1 VIEW COMPARISON:  Most recent radiograph 06/06/2019, CT same day FINDINGS: Post median sternotomy and CABG.The cardiomediastinal contours are normal. Coronary  stents are visualized. The lungs are clear. Pulmonary vasculature is normal. No consolidation, pleural effusion, or pneumothorax. No acute osseous abnormalities are seen. IMPRESSION: No acute chest findings. Electronically Signed   By: Keith Rake M.D.   On: 07/13/2019 01:22    Procedures .Critical Care Performed by: Ripley Fraise, MD Authorized by: Ripley Fraise, MD   Critical care provider statement:    Critical care time (minutes):  35   Critical care start time:  07/13/2019 1:00 AM   Critical care end time:  07/13/2019 1:35 AM   Critical care time was exclusive of:  Separately billable procedures and treating other patients   Critical care was necessary to treat or prevent imminent or life-threatening deterioration of the following conditions:  Dehydration and endocrine crisis   Critical care was time spent personally by me on the following activities:  Pulse oximetry, ordering and review of radiographic studies, ordering and review of laboratory studies, ordering and performing treatments and interventions, re-evaluation of patient's condition, examination of patient, development of treatment plan with patient or surrogate, evaluation of patient's response to treatment and review of old charts   I assumed direction of critical care for this patient from another provider in my specialty: no      Medications Ordered in ED Medications  potassium chloride SA (KLOR-CON) CR tablet 40 mEq (has no administration in time range)  acetaminophen (TYLENOL) tablet 650 mg (650 mg Oral Given 07/13/19 0046)  LORazepam (ATIVAN) injection 1 mg (1 mg Intravenous Given 07/13/19 0046)  lactated ringers bolus 1,000 mL (0 mLs Intravenous Stopped 07/13/19 0424)    And  lactated ringers bolus 1,000 mL (0 mLs Intravenous Stopped 07/13/19 0424)    And  lactated ringers bolus 250 mL (0 mLs Intravenous Stopped 07/13/19 0458)  ceFEPIme (MAXIPIME) 2 g in sodium chloride 0.9 % 100 mL IVPB (0 g Intravenous  Stopped 07/13/19 0204)  metroNIDAZOLE (FLAGYL) IVPB 500 mg (0 mg Intravenous Stopped 07/13/19 0424)  vancomycin (VANCOCIN) IVPB 1000 mg/200 mL premix (0 mg Intravenous Stopped 07/13/19 0425)    ED Course  I have reviewed the triage vital signs and the nursing notes.  Pertinent labs & imaging results that were available during my care of the patient were reviewed by me and considered in my medical decision making (see chart for details).    MDM Rules/Calculators/A&P                         12:45 AM Patient presents with vomiting for the past several days.  She reports she feels that she is in DKA.  Patient is tachypneic, tachycardic and febrile.  Code sepsis has been called.  IV fluids and antibiotics been ordered.  We will follow closely 3:45 AM Patient is much improved.  Heart rate is much improved.  She is more comfortable.  No new complaints.  She has been getting treated for UTI as an outpatient, but has been vomiting.  Suspect she may have partially treated UTI.  No convincing signs of DKA.  Will recheck labs after fluids.  If labs improved she may be a  candidate for discharge and continue Keflex at home 5:07 AM Patient improved.  Labs improved.  She is taking p.o.  She can continue home antibiotics.  No signs of DKA.  She is appropriate for discharge home   This patient presents to the ED for concern of hyperglycemia and fever, this involves an extensive number of treatment options, and is a complaint that carries with it a high risk of complications and morbidity.  The differential diagnosis includes DKA, sepsis, pneumonia, urinary tract infection   Lab Tests:   I Ordered, reviewed, and interpreted labs, which included electrolytes, complete blood count, lactic acid  Medicines ordered:   I ordered medication IV fluids and antibiotics for presumed sepsis  Imaging Studies ordered:   I ordered imaging studies which included chest x-ray   I independently visualized and  interpreted imaging which showed no acute findings  Additional history obtained:    Previous records obtained and reviewed    Reevaluation:  After the interventions stated above, I reevaluated the patient and found patient is dramatically improved  Critical Interventions:  . IV fluids and IV antibiotics  Final Clinical Impression(s) / ED Diagnoses Final diagnoses:  Dehydration  Acute febrile illness    Rx / DC Orders ED Discharge Orders    None       Ripley Fraise, MD 07/13/19 [phone]

## 2019-07-13 NOTE — Progress Notes (Signed)
Spoke with bedside RN at this time who is aware of need for 2nd L/A

## 2019-07-14 ENCOUNTER — Encounter (HOSPITAL_COMMUNITY): Payer: Self-pay | Admitting: Emergency Medicine

## 2019-07-14 ENCOUNTER — Other Ambulatory Visit: Payer: Self-pay

## 2019-07-14 ENCOUNTER — Emergency Department (HOSPITAL_COMMUNITY)
Admission: EM | Admit: 2019-07-14 | Discharge: 2019-07-14 | Disposition: A | Discharge status: Home / Self Care | Payer: Self-pay | Attending: Emergency Medicine | Admitting: Emergency Medicine

## 2019-07-14 ENCOUNTER — Emergency Department (HOSPITAL_COMMUNITY): Payer: Self-pay

## 2019-07-14 DIAGNOSIS — R7881 Bacteremia: Secondary | ICD-10-CM

## 2019-07-14 DIAGNOSIS — N390 Urinary tract infection, site not specified: Secondary | ICD-10-CM

## 2019-07-14 DIAGNOSIS — R079 Chest pain, unspecified: Secondary | ICD-10-CM

## 2019-07-14 DIAGNOSIS — E039 Hypothyroidism, unspecified: Secondary | ICD-10-CM

## 2019-07-14 DIAGNOSIS — E876 Hypokalemia: Secondary | ICD-10-CM

## 2019-07-14 DIAGNOSIS — I255 Ischemic cardiomyopathy: Secondary | ICD-10-CM

## 2019-07-14 DIAGNOSIS — I2510 Atherosclerotic heart disease of native coronary artery without angina pectoris: Secondary | ICD-10-CM

## 2019-07-14 DIAGNOSIS — E1043 Type 1 diabetes mellitus with diabetic autonomic (poly)neuropathy: Secondary | ICD-10-CM

## 2019-07-14 LAB — COMPREHENSIVE METABOLIC PANEL
ALT: 33 U/L (ref 0–44)
AST: 51 U/L — ABNORMAL HIGH (ref 15–41)
Albumin: 3.5 g/dL (ref 3.5–5.0)
Alkaline Phosphatase: 66 U/L (ref 38–126)
Anion gap: 9 (ref 5–15)
BUN: 8 mg/dL (ref 6–20)
CO2: 23 mmol/L (ref 22–32)
Calcium: 8.7 mg/dL — ABNORMAL LOW (ref 8.9–10.3)
Chloride: 106 mmol/L (ref 98–111)
Creatinine, Ser: 1.02 mg/dL — ABNORMAL HIGH (ref 0.44–1.00)
GFR calc Af Amer: >60 mL/min (ref >60)
GFR calc non Af Amer: >60 mL/min (ref >60)
Glucose, Bld: 103 mg/dL — ABNORMAL HIGH (ref 70–99)
Potassium: 3.4 mmol/L — ABNORMAL LOW (ref 3.5–5.1)
Sodium: 138 mmol/L (ref 135–145)
Total Bilirubin: 0.7 mg/dL (ref 0.3–1.2)
Total Protein: 6.8 g/dL (ref 6.5–8.1)

## 2019-07-14 LAB — CBC WITH DIFFERENTIAL/PLATELET
Abs Immature Granulocytes: 0.02 10*3/uL (ref 0.00–0.07)
Basophils Absolute: 0 10*3/uL (ref 0.0–0.1)
Basophils Relative: 0 %
Eosinophils Absolute: 0.2 10*3/uL (ref 0.0–0.5)
Eosinophils Relative: 2 %
HCT: 32.5 % — ABNORMAL LOW (ref 36.0–46.0)
Hemoglobin: 10.3 g/dL — ABNORMAL LOW (ref 12.0–15.0)
Immature Granulocytes: 0 %
Lymphocytes Relative: 42 %
Lymphs Abs: 3.3 10*3/uL (ref 0.7–4.0)
MCH: 28.8 pg (ref 26.0–34.0)
MCHC: 31.7 g/dL (ref 30.0–36.0)
MCV: 90.8 fL (ref 80.0–100.0)
Monocytes Absolute: 0.6 10*3/uL (ref 0.1–1.0)
Monocytes Relative: 8 %
Neutro Abs: 3.8 10*3/uL (ref 1.7–7.7)
Neutrophils Relative %: 48 %
Platelets: 418 10*3/uL — ABNORMAL HIGH (ref 150–400)
RBC: 3.58 MIL/uL — ABNORMAL LOW (ref 3.87–5.11)
RDW: 15.9 % — ABNORMAL HIGH (ref 11.5–15.5)
WBC: 8 10*3/uL (ref 4.0–10.5)
nRBC: 0 % (ref 0.0–0.2)

## 2019-07-14 LAB — SARS CORONAVIRUS 2 BY RT PCR (HOSPITAL ORDER, PERFORMED IN ~~LOC~~ HOSPITAL LAB): SARS Coronavirus 2: NEGATIVE

## 2019-07-14 LAB — URINE CULTURE: Culture: NO GROWTH

## 2019-07-14 LAB — TROPONIN I (HIGH SENSITIVITY)
Troponin I (High Sensitivity): 72 ng/L — ABNORMAL HIGH (ref <18)
Troponin I (High Sensitivity): 63 ng/L — ABNORMAL HIGH (ref <18)

## 2019-07-14 LAB — I-STAT BETA HCG BLOOD, ED (MC, WL, AP ONLY): I-stat hCG, quantitative: <5 m[IU]/mL (ref <5)

## 2019-07-14 LAB — CBG MONITORING, ED: Glucose-Capillary: 109 mg/dL — ABNORMAL HIGH (ref 70–99)

## 2019-07-14 LAB — LACTIC ACID, PLASMA: Lactic Acid, Venous: 1.2 mmol/L (ref 0.5–1.9)

## 2019-07-14 MED ORDER — POTASSIUM CHLORIDE CRYS ER 20 MEQ PO TBCR
40.0000 meq | EXTENDED_RELEASE_TABLET | Freq: Once | ORAL | Status: AC
  Administered 2019-07-14: 40 meq via ORAL
  Filled 2019-07-14: qty 2

## 2019-07-14 MED ORDER — IOHEXOL 300 MG/ML  SOLN
100.0000 mL | Freq: Once | INTRAMUSCULAR | Status: AC | PRN
  Administered 2019-07-14: 100 mL via INTRAVENOUS

## 2019-07-14 MED ORDER — VANCOMYCIN HCL 1500 MG/300ML IV SOLN
1500.0000 mg | Freq: Once | INTRAVENOUS | Status: AC
  Administered 2019-07-14: 1500 mg via INTRAVENOUS
  Filled 2019-07-14: qty 300

## 2019-07-14 MED ORDER — SODIUM CHLORIDE 0.9 % IV BOLUS
1000.0000 mL | Freq: Once | INTRAVENOUS | Status: AC
  Administered 2019-07-14: 1000 mL via INTRAVENOUS

## 2019-07-14 NOTE — ED Triage Notes (Signed)
Patient received a call this evening and advised her to go to ER due to positive blood culture ( MRSA) result collected last night , afebrile at triage , respirations unlabored , denies pain .

## 2019-07-14 NOTE — Consult Note (Signed)
Cardiology Consultation:   [name] MRN: [id number]; DOB: [birth year]  Admit date: 07/14/2019 Date of Consult: 07/14/2019  Primary Care Provider: [name], NP [facility] Cardiologist: Fransico Him, MD  [place] Electrophysiologist:  None    Patient Profile:   Jennifer Hogan is a 49 y.o. female with a history of CAD s/p DES x3 to the LAD and PTCA x1 to intermediate branch in 02/2017, DES to ostial LCX in 01/2018, and then CABG in 10/2018, ischemic cardiomyopathy with improved EF of 55-60% on last Echo in 11/2018, hyperlipidemia, type 1 diabetes mellitus on insulin pump, hypothyroidism, anxiety/depression who is being seen today for the evaluation of chest pain at the request of Dr. Sherry Ruffing.  History of Present Illness:   Jennifer Hogan is a 49 year old female with the above history who is followed by Dr. Radford Pax. Patient admitted with NSETMI in 02/2017 and underwent PCI with DES x3 to LAD and DES x1 to intermediate branch. Echo at that time showed LVEF of 45-50% with hypokinesis of apical myocardium. She was readmitted in 01/2018 with another NSTEMI and underwent DES to ostial LCX. She presented again in 10/29/2018 with NSTEMI and cardiac catheterization showed progressive disease; therefore, CT surgery was consulted and recommended CABG. Patient underwent CABG x3 (LIMA-LAD-RI and right IMA-OM) on 11/02/2018. Most recent Echo in 11/2018 showed LVEF of 55-60% with abnormal septal motion consistent with post-operative status and very mild hypokinesis of inferior wall (improved from prior). Patient was last seen by Dr. Radford Pax in 12/2018 at which time she was doing well.   Patient has had multiple admission for DKA in setting of uncontrolled type 1 diabetes, most recently in 04/2019. Patient was seen in the ED on 06/06/2019 for cough and diagnosed with bronchitis.  Patient presented to the ED via EMS on 07/13/2019 with multiple complaints including intermittent chest  pain, vomiting,  hyperglycemia, and decreased urine output. Patient was tachycardic upon arrival of EMS and given 1L bolus of normal saline. Upon arrival to the ED, patient was tachycardic, tachypneic, and febrile. EKG showed sinus tachycardia, rate 121 bpm, but no acute ischemic changes. Chest x-ray no acute findings. WBC 13.8, Hgb 12.6, Plts 527. Na 134, K 3.4, CO2 18, Glucose 141, BUN 24, Cr 1.07. Lactic acid 2.3. Code sepsis was called and patient was started on IV fluids and antibiotics. Patient was being treated for UTI as an outpatient but had been vomiting so it was felt that she had a partially treated UTI. Labs and clinical symptoms improved after fluids. Therefore, she was discharged and instructed to continue home Keflex.   After patient was discharged from the ED, she received a call that her blood culture was positive for MRSA and shew was instructed to return to the ED. Vitals stable on arrival to the ED. Internal Medicine Residency was consulted and felt like this was likely a contaminant given no obvious source and only from 1 set. Repeat blood cultures were drawn. High-sensitivity troponin was checked given reports of chest pain a few days ago and came back minimally elevated at 72 >> 63. Therefore, Cardiology was consulted.    Past Medical History:  Diagnosis Date  . Anemia   . Anxiety   . Coronary artery disease    a. s/pPTCA DES x3 in the LAD (ostial DES placed, mid DES placed, distal DES placed)and PTCA/DES x1to intermediate branch 02/2017. b. ACS 02/08/18 with LCx stent placement.  . Depression   . Diabetes mellitus  type 1, on insulin pump  . DKA (diabetic ketoacidoses) ([place]) 12/31/2017  . Elevated platelet count   . High cholesterol   . Hypothyroidism   . Ischemic cardiomyopathy    a. EF low-normal with basal inferior akinesis, basal septal hypokinesis by echo 01/2018.    Past Surgical History:  Procedure Laterality Date  . APPENDECTOMY    . CORONARY ARTERY BYPASS  GRAFT N/A 11/04/2018   Procedure: CORONARY ARTERY BYPASS GRAFTING (CABG), ON PUMP, TIMES THREE, USING LEFT AND RIGHT INTERNAL MAMMARY ARTERIES;  Surgeon: Wonda Olds, MD;  Location: [facility];  Service: Open Heart Surgery;  Laterality: N/A;  . CORONARY STENT INTERVENTION N/A 03/20/2017   Procedure: DES x 3 LAD, DES OM; Surgeon: Burnell Blanks, MD;  Location: [facility];  Service: Cardiovascular;  Laterality: N/A;  . CORONARY/GRAFT ACUTE MI REVASCULARIZATION N/A 02/08/2018   Procedure: Coronary/Graft Acute MI Revascularization;  Surgeon: Belva Crome, MD;  Location: [facility];  Service: Cardiovascular;  Laterality: N/A;  . LEFT HEART CATH AND CORONARY ANGIOGRAPHY N/A 03/20/2017   Procedure: LEFT HEART CATH AND CORONARY ANGIOGRAPHY;  Surgeon: Burnell Blanks, MD;  Location: [facility];  Service: Cardiovascular;  Laterality: N/A;  . LEFT HEART CATH AND CORONARY ANGIOGRAPHY N/A 02/08/2018   Procedure: LEFT HEART CATH AND CORONARY ANGIOGRAPHY;  Surgeon: Belva Crome, MD;  Location: [facility];  Service: Cardiovascular;  Laterality: N/A;  . LEFT HEART CATH AND CORONARY ANGIOGRAPHY N/A 10/29/2018   Procedure: LEFT HEART CATH AND CORONARY ANGIOGRAPHY;  Surgeon: Burnell Blanks, MD;  Location: [facility];  Service: Cardiovascular;  Laterality: N/A;  . TEE WITHOUT CARDIOVERSION N/A 11/04/2018   Procedure: TRANSESOPHAGEAL ECHOCARDIOGRAM (TEE);  Surgeon: Wonda Olds, MD;  Location: [facility];  Service: Open Heart Surgery;  Laterality: N/A;     Home Medications:  Prior to Admission medications   Medication Sig Start Date End Date Taking? Authorizing Provider  acetaminophen (TYLENOL) 325 MG tablet Take 650 mg by mouth daily as needed (couch/pain/fever).   Yes [provider]  albuterol (VENTOLIN HFA) 108 (90 Base) MCG/ACT inhaler INHALE 2 PUFFS INTO THE LUNGS EVERY 6 (SIX) HOURS AS NEEDED FOR WHEEZING OR SHORTNESS OF BREATH. 06/26/19  07/26/19 Yes [name], NP  aspirin 81 MG EC tablet Take 1 tablet (81 mg total) by mouth daily. 12/07/17  Yes Amin, Ankit Chirag, MD  atorvastatin (LIPITOR) 20 MG tablet Take 1 tablet (20 mg total) by mouth daily. Patient taking differently: Take 20 mg by mouth at bedtime.  02/19/19 02/19/20 Yes Turner, Eber Hong, MD  b complex vitamins tablet Take 1 tablet by mouth daily.   Yes [provider]  benzonatate (TESSALON) 200 MG capsule Take 1 capsule (200 mg total) by mouth 2 (two) times daily as needed for cough. 06/03/19  Yes [name], NP  cephALEXin (KEFLEX) 500 MG capsule Take 1 capsule (500 mg total) by mouth 2 (two) times daily. 07/08/19  Yes Montine Circle, PA-C  clopidogrel (PLAVIX) 75 MG tablet Take 1 tablet (75 mg total) by mouth daily. 03/05/19  Yes Turner, Eber Hong, MD  diclofenac Sodium (VOLTAREN) 1 % GEL Apply 2 g topically 4 (four) times daily. Patient taking differently: Apply 2 g topically daily as needed (For pain).  03/19/19  Yes [name], NP  ferrous sulfate 325 (65 FE) MG tablet Take 325 mg by mouth See admin instructions. Take one tablet (325 mg) by mouth once  or twice during menstrual cycle   Yes [provider]  FLUoxetine (PROZAC) 40 MG capsule Take 1 capsule (40 mg total) by mouth daily. 05/06/19  Yes [name], NP  fluticasone (FLONASE) 50 MCG/ACT nasal spray Place 1 spray into both nostrils at bedtime.   Yes [provider]  hydrOXYzine (ATARAX/VISTARIL) 25 MG tablet Take 1 tablet (25 mg total) by mouth 3 (three) times daily as needed. Patient taking differently: Take 25 mg by mouth 3 (three) times daily as needed for anxiety.  03/19/19  Yes [name], NP  Insulin Human (INSULIN PUMP) SOLN Inject into the skin continuous. Humalog   Yes [provider]  insulin lispro (HUMALOG) 100 UNIT/ML injection Inject 0.35 mLs (35 Units total) into the skin See admin instructions. 35 units daily continues insulin pump 05/20/19   Yes Renato Shin, MD  ipratropium (ATROVENT) 0.06 % nasal spray Place 2 sprays into both nostrils 2 (two) times daily as needed for rhinitis.   Yes [provider]  isosorbide mononitrate (IMDUR) 30 MG 24 hr tablet Take 0.5 tablets (15 mg total) by mouth daily. 01/08/19  Yes [name], MD  levothyroxine (EUTHYROX) 100 MCG tablet Take 1 tablet (100 mcg total) by mouth daily before breakfast. 06/03/19  Yes [name], NP  metoCLOPramide (REGLAN) 5 MG tablet Take 1 tablet (5 mg total) by mouth every 12 (twelve) hours as needed for nausea or vomiting. 12/17/18 12/17/19 Yes McClung, Dionne Bucy, PA-C  montelukast (SINGULAIR) 10 MG tablet Take 1 tablet (10 mg total) by mouth at bedtime. 03/19/19 07/14/19 Yes [name], NP  nitroGLYCERIN (NITROSTAT) 0.4 MG SL tablet Place 1 tablet (0.4 mg total) under the tongue every 5 (five) minutes as needed for chest pain. 01/08/19  Yes Turner, Eber Hong, MD  ondansetron (ZOFRAN ODT) 4 MG disintegrating tablet Take 1 tablet (4 mg total) by mouth every 8 (eight) hours as needed for nausea or vomiting. 12/17/18  Yes McClung, Angela M, PA-C  pantoprazole (PROTONIX) 40 MG tablet Take 1 tablet (40 mg total) by mouth 2 (two) times daily as needed (acid reflux). Patient taking differently: Take 40 mg by mouth daily.  06/03/19 07/14/19 Yes [name], NP  Phenyleph-Doxyl-DM-Aspirin (ALKA-SELTZER PLUS COLD DY/NGHT PO) Take 1 tablet by mouth at bedtime as needed (cough).   Yes [provider]  insulin aspart (NOVOLOG) 100 UNIT/ML injection 35 units per insulin pump daily; MUST KEEP APPT FOR FUTURE REFILLS TO BE AUTHORIZED Patient not taking: Reported on 06/06/2019 03/24/19   Renato Shin, MD  LORazepam (ATIVAN) 0.5 MG tablet Take 1 tablet (0.5 mg total) by mouth every 8 (eight) hours as needed for anxiety. Patient not taking: Reported on 07/14/2019 07/13/19   Ripley Fraise, MD  QUEtiapine (SEROQUEL) 50 MG tablet Take 1 tablet (50 mg total) by mouth  at bedtime. May increase to 2 tablets or 100 mg at bedtime after 2 weeks if needed Patient not taking: Reported on 06/03/2019 03/19/19 06/17/19  [name], NP    Inpatient Medications: Scheduled Meds:  Continuous Infusions:  PRN Meds:   Allergies:    Allergies  Allergen Reactions  . Atorvastatin Other (See Comments)    Myalgias on 80mg  dose, tolerates 20mg  ok  . Crestor [Rosuvastatin Calcium] Other (See Comments)    Severe myalgias and joint pain. Has tolerated atorvastatin though  . Monosodium Glutamate Nausea And Vomiting and Other (See Comments)    migraine  . Peanut-Containing Drug Products Other (See Comments)  Vomiting, upset stomach and some wheezing  . Zetia [Ezetimibe] Other (See Comments)    myalgias  . Ciprofloxin Hcl [Ciprofloxacin] Nausea And Vomiting and Other (See Comments)    Severe migraine  . Food Color Red [Red Dye] Diarrhea    Social History:   Social History   Socioeconomic History  . Marital status: Divorced    Spouse name: Not on file  . Number of children: Not on file  . Years of education: Not on file  . Highest education level: Not on file  Occupational History  . 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 Smoking status: Never Smoker  . Smokeless tobacco: Never Used  . Tobacco comment: Use marijuana  Vaping Use  . Vaping Use: Never used  Substance and Sexual Activity  . Alcohol use: No    Alcohol/week: 0.0 standard drinks  . Drug use: Not Currently    Types: Marijuana    Comment: last use last week, occaisonal marijuana use  . Sexual activity: Not Currently    Birth control/protection: None  Other Topics Concern  . Not on file  Social History Narrative  . Not on file   Social Determinants of Health   Financial Resource Strain:   . Difficulty of Paying Living Expenses:   Food Insecurity:   . Worried About Charity fundraiser in the Last Year:   . Arboriculturist in the Last Year:   Transportation Needs:     . Film/video editor (Medical):   Marland Kitchen Lack of Transportation (Non-Medical):   Physical Activity:   . Days of Exercise per Week:   . Minutes of Exercise per Session:   Stress:   . Feeling of Stress :   Social Connections:   . Frequency of Communication with Friends and Family:   . Frequency of Social Gatherings with Friends and Family:   . Attends Religious Services:   . Active Member of Clubs or Organizations:   . Attends Archivist Meetings:   Marland Kitchen Marital Status:   Intimate Partner Violence:   . Fear of Current or Ex-Partner:   . Emotionally Abused:   Marland Kitchen Physically Abused:   . Sexually Abused:     Family History:    Family History  Problem Relation Age of Onset  . Coronary artery disease Father   . Congestive Heart Failure Father   . Asthma Father   . Cancer Mother        T cell lymphoma     ROS:  Please see the history of present illness.   All other ROS reviewed and negative.     Physical Exam/Data:   Vitals:   07/14/19 1425 07/14/19 1440 07/14/19 1455 07/14/19 1509  BP: 134/88   118/80  Pulse: 79 75 90 72  Resp:    16  Temp:      TempSrc:      SpO2: 96% 100%  100%  Weight:      Height:       No intake or output data in the 24 hours ending 07/14/19 1517 Last 3 Weights 07/14/2019 07/13/2019 07/13/2019  Weight (lbs) 165 lb 5.5 oz 150 lb 150 lb  Weight (kg) 75 kg 68.04 kg 68.04 kg     Body mass index is 27.51 kg/m.  General:  Well nourished, well developed, in no acute distress HEENT: normal Lymph: no adenopathy Neck: no JVD Endocrine:  No thryomegaly Vascular: No carotid bruits; FA pulses 2+ bilaterally  without bruits  Cardiac:  normal S1, S2; RRR; no murmur  Lungs:  clear to auscultation bilaterally, no wheezing, rhonchi or rales  Abd: soft, nontender, no hepatomegaly  Ext: no edema Musculoskeletal:  No deformities, BUE and BLE strength normal and equal Skin: warm and dry  Neuro:  CNs 2-12 intact, no focal abnormalities noted Psych:   Normal affect   EKG:  The EKG was personally reviewed and demonstrates:  NST, biatrial enlargement, nonspecific ST segment "scooping" Telemetry:  Telemetry was personally reviewed and demonstrates:  NSR  Relevant CV Studies: Left Heart Catheterization 10/29/2018:  Ost Cx to Prox Cx lesion is 99% stenosed.  Ramus-2 lesion is 20% stenosed.  Ramus-1 lesion is 60% stenosed.  Ramus-3 lesion is 70% stenosed.  Prox LAD to Mid LAD lesion is 10% stenosed.  Previously placed Mid LAD to Dist LAD stent (unknown type) is widely patent.  Dist LAD-1 lesion is 99% stenosed.  Dist LAD-2 lesion is 99% stenosed.  There is mild to moderate left ventricular systolic dysfunction.  LV end diastolic pressure is normal.  The left ventricular ejection fraction is 35-45% by visual estimate.  There is no mitral valve regurgitation.  Mid LAD lesion is 60% stenosed.  Prox Cx to Mid Cx lesion is 70% stenosed.   1. Severe double vessel CAD 2. The LAD has a patent ostial/proximal stented segment with no restenosis. The distal stented segment has no restenosis. The mid vessel between stented segments has a moderately severe, eccentric stenosis. The small caliber, apical LAD has diffuse severe serial stenoses.  3. The intermediate branch (early diagonal) has a moderately severe ostial/proximal stenosis followed by a patent mid stented segment. The stented segment has moderate restenosis.  Just beyond the stented segment there is a severe stenosis.  4. The Circumflex has an ostial/proximal stent that has severe restenosis within the stent. The mid Circumflex has a moderately severe stenosis.  5. The RCA is a large dominant artery with no obstructive disease 6. Moderate segmental LV systolic dysfunction. LVEF around 40%.   Recommendations: I have reviewed her films today with Dr. [place]. Her culprit lesion is likely the ostial Circumflex stented segment that shows severe stent restenosis. Her disease has  progressed in the intermediate branch and the LAD. She is a diabetic and given the aggressive nature of her disease with evidence of stent restenosis in a short period of time, I think we should consider bypass surgery. We will resume heparin 8 hours after the sheath pull. Will admit to the ICU. Will ask CT surgery to see her to discuss CABG. Plavix will be held.  _______________  Echocardiogram 11/25/2018: Impressions: 1. Left ventricular ejection fraction, by visual estimation, is 55 to  60%. The left ventricle has normal function. There is no left ventricular  hypertrophy.  2. Abnormal septal motion consistent with post-operative status.  3. Left ventricular diastolic parameters are indeterminate.  4. Very mild inferior wall motion hypokinesis, improved from prior.  5. Global right ventricle has normal systolic function.The right  ventricular size is normal. No increase in right ventricular wall  thickness.  6. Left atrial size was normal.  7. Right atrial size was normal.  8. Moderate pleural effusion in the left lateral region.  9. The mitral valve is normal in structure. Trace mitral valve  regurgitation.  10. The tricuspid valve is normal in structure. Tricuspid valve  regurgitation is trivial.  11. The aortic valve is tricuspid. Aortic valve regurgitation is not  visualized. Mild aortic valve sclerosis  without stenosis.  12. The pulmonic valve was grossly normal. Pulmonic valve regurgitation is  trivial.  13. Normal pulmonary artery systolic pressure.  14. The inferior vena cava is normal in size with greater than 50%  respiratory variability, suggesting right atrial pressure of 3 mmHg.   Laboratory Data:  High Sensitivity Troponin:   Recent Labs  Lab 07/14/19 1020 07/14/19 1238  TROPONINIHS 72* 63*     Chemistry Recent Labs  Lab 07/13/19 0048 07/13/19 0359 07/14/19 0046  NA 134* 134* 138  K 3.4* 3.1* 3.4*  CL 100 103 106  CO2 18* 21* 23  GLUCOSE 141*  129* 103*  BUN 24* 20 8  CREATININE 1.07* 0.98 1.02*  CALCIUM 9.2 8.1* 8.7*  GFRNONAA >60 >60 >60  GFRAA >60 >60 >60  ANIONGAP 16* 10 9    Recent Labs  Lab 07/13/19 0048 07/14/19 0046  PROT 8.1 6.8  ALBUMIN 4.2 3.5  AST 77* 51*  ALT 40 33  ALKPHOS 84 66  BILITOT 0.7 0.7   Hematology Recent Labs  Lab 07/13/19 0048 07/14/19 0046  WBC 13.8* 8.0  RBC 4.45 3.58*  HGB 12.6 10.3*  HCT 38.6 32.5*  MCV 86.7 90.8  MCH 28.3 28.8  MCHC 32.6 31.7  RDW 15.4 15.9*  PLT 527* 418*   BNPNo results for input(s): BNP, PROBNP in the last 168 hours.  DDimer No results for input(s): DDIMER in the last 168 hours.   Radiology/Studies:  CT ABDOMEN PELVIS W CONTRAST  Result Date: 07/14/2019 CLINICAL DATA:  Blood cultures drawn yesterday are positive for MRSA. Decreased urine output, abdominal pain and vomiting. EXAM: CT ABDOMEN AND PELVIS WITH CONTRAST TECHNIQUE: Multidetector CT imaging of the abdomen and pelvis was performed using the standard protocol following bolus administration of intravenous contrast. CONTRAST:  100 mL OMNIPAQUE IOHEXOL 300 MG/ML  SOLN COMPARISON:  CT abdomen and pelvis 11/24/2018 FINDINGS: Lower chest: Small areas of ground-glass attenuation are seen in medial aspects of both the right and left lower lobes. Lung bases otherwise clear. No pleural or pericardial effusion. Hepatobiliary: No focal liver abnormality is seen. No gallstones, gallbladder wall thickening, or biliary dilatation. The liver is mildly low attenuating consistent with fatty infiltration. Pancreas: The pancreas is atrophic. No mass or surrounding inflammatory change. Spleen: Normal in size without focal abnormality. Adrenals/Urinary Tract: Adrenal glands are unremarkable. Kidneys are normal, without renal calculi, focal lesion, or hydronephrosis. Bladder is unremarkable. Stomach/Bowel: Stomach is within normal limits. Status post appendectomy. No evidence of bowel wall thickening, distention, or inflammatory  changes. Vascular/Lymphatic: Aortic atherosclerosis. No enlarged abdominal or pelvic lymph nodes. Reproductive: Uterus and bilateral adnexa are unremarkable. Other: None. Musculoskeletal: No acute or focal abnormality. Marked degenerative disease L3-4 and L4-5 and straightening of lumbar lordosis appear unchanged. IMPRESSION: No acute abnormality abdomen or pelvis. Small areas of ground-glass attenuation in the medial aspect of both the right and left lower lobes could be due to inflammatory change atelectasis. Mild fatty infiltration of the liver. Atherosclerosis. Electronically Signed   By: Inge Rise M.D.   On: 07/14/2019 14:10   DG Chest Port 1 View  Result Date: 07/13/2019 CLINICAL DATA:  Chest pain fever. EXAM: PORTABLE CHEST 1 VIEW COMPARISON:  Most recent radiograph 06/06/2019, CT same day FINDINGS: Post median sternotomy and CABG.The cardiomediastinal contours are normal. Coronary stents are visualized. The lungs are clear. Pulmonary vasculature is normal. No consolidation, pleural effusion, or pneumothorax. No acute osseous abnormalities are seen. IMPRESSION: No acute chest findings. Electronically Signed   By:  Keith Rake M.D.   On: 07/13/2019 01:22      Assessment and Plan:   Chest Pain - High-sensitivity troponin minimally elevated and down-trending at 72 >> 63. - low risk ECG - Continue DAPT with Aspirin and Plavix.   Ischemic Cardiomyopathy - Most recent Echo from 11/2018 showed improved LVEF of 55-60%.  - Not on ACEi/ARB or beta-blocker at home due to problems with dizziness and orthostatic hypotension in the past.   Hyperlipidemia - Continue Lipitor 80mg  daily.  Type 1 Diabetes Mellitus  - On insulin pump.  For questions or updates, please contact [place] Please consult [email or web address] for contact info under    Signed, Darreld Mclean, PA-C  07/14/2019 3:17 PM   I have seen and examined the patient along with Darreld Mclean, PA-C.  I have  reviewed the chart, notes and new data.  I agree with PA/NP's note.  Key new complaints: chest pain was atypical and her presentation was dominated by GI complaints Key examination changes: afebrile, normal CV exam Key new findings / data: a single blood culture +ve for MRSA.  Normal WBC. Marginal elevation in hsTropI, plateau pattern, no acute ECg changes.  PLAN: Very low suspicion for acute coronary insufficiency. On appropriate treatment with antiplatelet, statin and beta blocker therapy. No clinical HF and preserved LVEF. No change in therapy. No additional inpatient workup planned.  Sanda Klein, MD, [place] [phone] 07/14/2019, 4:09 PM

## 2019-07-14 NOTE — Discharge Instructions (Signed)
As discussed, all of your results were reassuring today. You cardiac marker is downtrending and cardiology states that you are on all the correct medication. You will be called if anything shows up in your new blood cultures. Please follow-up with PCP if symptoms do not improve within the next week. Return to the ER for new or worsening symptoms.

## 2019-07-14 NOTE — ED Notes (Signed)
Pt given dc instructions pt verbalizes understanding.  

## 2019-07-14 NOTE — ED Provider Notes (Addendum)
[facility] EMERGENCY DEPARTMENT Provider Note   CSN: [id number] Arrival date & time: 07/13/19  2301     History Chief Complaint  Patient presents with  . Abnormal Lab    Jennifer Hogan is a 49 y.o. female with a past medical history significant for anemia, anxiety, CAD status post PTCA DES x3 in LAD, depression, type 1 diabetes, history of DKA, hyperlipidemia, hypothyroidism, and ischemic cardiomyopathy who presents to the ED due to positive blood cultures.  Patient was seen in the ED yesterday due to decreased urinary output, hyperglycemia, abdominal pain, and emesis.  Blood cultures were positive for MRSA.  Yesterday in the ED patient was given IV cefepime, Flagyl, and vancomycin with improvement in symptoms and discharged home.  Patient is currently being treated for UTI for the past week with Keflex, but notes she developed sudden onset of emesis on Wednesday and has been unable to keep anything down, including her antibiotics.  Patient notes she feels generally better than yesterday; however, admits to a headache which she describes as pressure-like.  She notes the headache is surrounding her whole head.  Denies changes from previous headache.  Denies vision changes.  Denies sudden onset and greatest intensity at onset.  Denies numbness/tingling, unilateral weakness, speech changes, and facial droop.  Patient's last fever was last night which was 100.1 F.  Patient also admits to chest pain with her last episode being Friday.  She took 2 nitroglycerin on Friday which relieved her pain.  No troponins drew yesterday. Patient sees Dr. Radford Pax with cardiology. Chest pain was centrally located which patient describes as a pressure-like sensation that started on Thursday and resolved after nitroglycerin on Friday. Chest pain associated with palpitations. Patient had a CABG on 11/04/2018.  History obtained from patient and past medical records. No interpreter used during encounter.    Past Medical History:  Diagnosis Date  . Anemia   . Anxiety   . Coronary artery disease    a. s/pPTCA DES x3 in the LAD (ostial DES placed, mid DES placed, distal DES placed)and PTCA/DES x1to intermediate branch 02/2017. b. ACS 02/08/18 with LCx stent placement.  . Depression   . Diabetes mellitus    type 1, on insulin pump  . DKA (diabetic ketoacidoses) ([place]) 12/31/2017  . Elevated platelet count   . High cholesterol   . Hypothyroidism   . Ischemic cardiomyopathy    a. EF low-normal with basal inferior akinesis, basal septal hypokinesis by echo 01/2018.    Patient Active Problem List   Diagnosis Date Noted  . Emesis 05/01/2019  . AKI (acute kidney injury) ([place]) 05/01/2019  . Severe hyperglycemia due to diabetes mellitus ([place]) 03/15/2019  . Pleural effusion 12/09/2018  . Abdominal pain 11/28/2018  . Intractable nausea and vomiting 11/28/2018  . Diarrhea 11/28/2018  . DKA (diabetic ketoacidoses) ([place]) 11/25/2018  . Sinus tachycardia 11/24/2018  . Chronic systolic heart failure ([place])   . Cardiomyopathy ([place])   . Hypotension 10/29/2018  . DKA, type 1 ([place]) 05/24/2018  . Moderate episode of recurrent major depressive disorder ([place]) 03/27/2018  . Epigastric pain   . Hyperglycemia due to type 1 diabetes mellitus ([place]) 02/14/2018  . Hyperlipidemia LDL goal <70 02/14/2018  . S/P drug eluting coronary stent placement   . ACS (acute coronary syndrome) ([place]) 02/08/2018  . ST elevation myocardial infarction (STEMI) ([place]) 02/08/2018  . DKA (diabetic ketoacidosis) ([place]) 12/31/2017  . Marijuana abuse 12/31/2017  . Gastroparesis 12/06/2017  . Gastroparesis due to DM ([place])  03/28/2017  . Coronary artery disease 03/28/2017  . Ischemic cardiomyopathy 03/28/2017  . Pure hypercholesterolemia   . NSTEMI (non-ST elevated myocardial infarction) ([place])   . Anemia 03/17/2017  . Allergic state 08/07/2015  . Vitiligo 07/09/2012  . Type 1 diabetes mellitus ([place]) 07/09/2012  . Depression  12/04/2010  . Non-intractable vomiting with nausea 12/04/2010  . Hypothyroidism 12/04/2010    Past Surgical History:  Procedure Laterality Date  . APPENDECTOMY    . CORONARY ARTERY BYPASS GRAFT N/A 11/04/2018   Procedure: CORONARY ARTERY BYPASS GRAFTING (CABG), ON PUMP, TIMES THREE, USING LEFT AND RIGHT INTERNAL MAMMARY ARTERIES;  Surgeon: Wonda Olds, MD;  Location: [facility];  Service: Open Heart Surgery;  Laterality: N/A;  . CORONARY STENT INTERVENTION N/A 03/20/2017   Procedure: DES x 3 LAD, DES OM; Surgeon: Burnell Blanks, MD;  Location: [facility];  Service: Cardiovascular;  Laterality: N/A;  . CORONARY/GRAFT ACUTE MI REVASCULARIZATION N/A 02/08/2018   Procedure: Coronary/Graft Acute MI Revascularization;  Surgeon: Belva Crome, MD;  Location: [facility];  Service: Cardiovascular;  Laterality: N/A;  . LEFT HEART CATH AND CORONARY ANGIOGRAPHY N/A 03/20/2017   Procedure: LEFT HEART CATH AND CORONARY ANGIOGRAPHY;  Surgeon: Burnell Blanks, MD;  Location: [facility];  Service: Cardiovascular;  Laterality: N/A;  . LEFT HEART CATH AND CORONARY ANGIOGRAPHY N/A 02/08/2018   Procedure: LEFT HEART CATH AND CORONARY ANGIOGRAPHY;  Surgeon: Belva Crome, MD;  Location: [facility];  Service: Cardiovascular;  Laterality: N/A;  . LEFT HEART CATH AND CORONARY ANGIOGRAPHY N/A 10/29/2018   Procedure: LEFT HEART CATH AND CORONARY ANGIOGRAPHY;  Surgeon: Burnell Blanks, MD;  Location: [facility];  Service: Cardiovascular;  Laterality: N/A;  . TEE WITHOUT CARDIOVERSION N/A 11/04/2018   Procedure: TRANSESOPHAGEAL ECHOCARDIOGRAM (TEE);  Surgeon: Wonda Olds, MD;  Location: [facility];  Service: Open Heart Surgery;  Laterality: N/A;     OB History    Gravida  0   Para  0   Term  0   Preterm  0   AB  0   Living  0     SAB  0   TAB  0   Ectopic  0   Multiple  0   Live Births  0           Family History  Problem  Relation Age of Onset  . Coronary artery disease Father   . Congestive Heart Failure Father   . Asthma Father   . Cancer Mother        T cell lymphoma    Social History   Tobacco Use  . Smoking status: Never Smoker  . Smokeless tobacco: Never Used  . Tobacco comment: Use marijuana  Vaping Use  . Vaping Use: Never used  Substance Use Topics  . Alcohol use: No    Alcohol/week: 0.0 standard drinks  . Drug use: Not Currently    Types: Marijuana    Comment: last use last week, occaisonal marijuana use    Home Medications Prior to Admission medications   Medication Sig Start Date End Date Taking? Authorizing Provider  acetaminophen (TYLENOL) 325 MG tablet Take 650 mg by mouth daily as needed (couch/pain/fever).   Yes [provider]  albuterol (VENTOLIN HFA) 108 (90 Base) MCG/ACT inhaler INHALE 2 PUFFS INTO THE LUNGS EVERY 6 (SIX) HOURS AS NEEDED FOR WHEEZING OR SHORTNESS OF BREATH. 06/26/19 07/26/19 Yes [name], NP  aspirin 81  MG EC tablet Take 1 tablet (81 mg total) by mouth daily. 12/07/17  Yes Amin, Ankit Chirag, MD  atorvastatin (LIPITOR) 20 MG tablet Take 1 tablet (20 mg total) by mouth daily. Patient taking differently: Take 20 mg by mouth at bedtime.  02/19/19 02/19/20 Yes Turner, Eber Hong, MD  b complex vitamins tablet Take 1 tablet by mouth daily.   Yes [provider]  benzonatate (TESSALON) 200 MG capsule Take 1 capsule (200 mg total) by mouth 2 (two) times daily as needed for cough. 06/03/19  Yes [name], NP  cephALEXin (KEFLEX) 500 MG capsule Take 1 capsule (500 mg total) by mouth 2 (two) times daily. 07/08/19  Yes Montine Circle, PA-C  clopidogrel (PLAVIX) 75 MG tablet Take 1 tablet (75 mg total) by mouth daily. 03/05/19  Yes Turner, Eber Hong, MD  diclofenac Sodium (VOLTAREN) 1 % GEL Apply 2 g topically 4 (four) times daily. Patient taking differently: Apply 2 g topically daily as needed (For pain).  03/19/19  Yes [name], NP  ferrous  sulfate 325 (65 FE) MG tablet Take 325 mg by mouth See admin instructions. Take one tablet (325 mg) by mouth once or twice during menstrual cycle   Yes [provider]  FLUoxetine (PROZAC) 40 MG capsule Take 1 capsule (40 mg total) by mouth daily. 05/06/19  Yes [name], NP  fluticasone (FLONASE) 50 MCG/ACT nasal spray Place 1 spray into both nostrils at bedtime.   Yes [provider]  hydrOXYzine (ATARAX/VISTARIL) 25 MG tablet Take 1 tablet (25 mg total) by mouth 3 (three) times daily as needed. Patient taking differently: Take 25 mg by mouth 3 (three) times daily as needed for anxiety.  03/19/19  Yes [name], NP  Insulin Human (INSULIN PUMP) SOLN Inject into the skin continuous. Humalog   Yes [provider]  insulin lispro (HUMALOG) 100 UNIT/ML injection Inject 0.35 mLs (35 Units total) into the skin See admin instructions. 35 units daily continues insulin pump 05/20/19  Yes Renato Shin, MD  ipratropium (ATROVENT) 0.06 % nasal spray Place 2 sprays into both nostrils 2 (two) times daily as needed for rhinitis.   Yes [provider]  isosorbide mononitrate (IMDUR) 30 MG 24 hr tablet Take 0.5 tablets (15 mg total) by mouth daily. 01/08/19  Yes [name], MD  levothyroxine (EUTHYROX) 100 MCG tablet Take 1 tablet (100 mcg total) by mouth daily before breakfast. 06/03/19  Yes [name], NP  metoCLOPramide (REGLAN) 5 MG tablet Take 1 tablet (5 mg total) by mouth every 12 (twelve) hours as needed for nausea or vomiting. 12/17/18 12/17/19 Yes McClung, Dionne Bucy, PA-C  montelukast (SINGULAIR) 10 MG tablet Take 1 tablet (10 mg total) by mouth at bedtime. 03/19/19 07/14/19 Yes [name], NP  nitroGLYCERIN (NITROSTAT) 0.4 MG SL tablet Place 1 tablet (0.4 mg total) under the tongue every 5 (five) minutes as needed for chest pain. 01/08/19  Yes Turner, Eber Hong, MD  ondansetron (ZOFRAN ODT) 4 MG disintegrating tablet Take 1 tablet (4 mg total) by  mouth every 8 (eight) hours as needed for nausea or vomiting. 12/17/18  Yes McClung, Angela M, PA-C  pantoprazole (PROTONIX) 40 MG tablet Take 1 tablet (40 mg total) by mouth 2 (two) times daily as needed (acid reflux). Patient taking differently: Take 40 mg by mouth daily.  06/03/19 07/14/19 Yes [name], NP  Phenyleph-Doxyl-DM-Aspirin (ALKA-SELTZER PLUS COLD DY/NGHT PO) Take 1 tablet by mouth at bedtime  as needed (cough).   Yes [provider]  insulin aspart (NOVOLOG) 100 UNIT/ML injection 35 units per insulin pump daily; MUST KEEP APPT FOR FUTURE REFILLS TO BE AUTHORIZED Patient not taking: Reported on 06/06/2019 03/24/19   Renato Shin, MD  LORazepam (ATIVAN) 0.5 MG tablet Take 1 tablet (0.5 mg total) by mouth every 8 (eight) hours as needed for anxiety. Patient not taking: Reported on 07/14/2019 07/13/19   Ripley Fraise, MD  QUEtiapine (SEROQUEL) 50 MG tablet Take 1 tablet (50 mg total) by mouth at bedtime. May increase to 2 tablets or 100 mg at bedtime after 2 weeks if needed Patient not taking: Reported on 06/03/2019 03/19/19 06/17/19  [name], NP    Allergies    Atorvastatin, Crestor [rosuvastatin calcium], Monosodium glutamate, Peanut-containing drug products, Zetia [ezetimibe], Ciprofloxin hcl [ciprofloxacin], and Food color red [red dye]  Review of Systems   Review of Systems  Constitutional: Positive for fever (last fever yesterday). Negative for chills.  Respiratory: Negative for shortness of breath.   Cardiovascular: Negative for chest pain (none currently).  Gastrointestinal: Negative for abdominal pain, diarrhea, nausea and vomiting.  Neurological: Positive for headaches.  All other systems reviewed and are negative.   Physical Exam Updated Vital Signs BP 126/84 (BP Location: Left Arm)   Pulse 90   Temp 97.6 F (36.4 C) (Oral)   Resp 16   Ht 5\' 5"  (1.651 m)   Wt 75 kg   SpO2 100%   BMI 27.51 kg/m   Physical Exam Vitals reviewed.    Constitutional:      General: She is not in acute distress.    Appearance: She is not ill-appearing.  HENT:     Head: Normocephalic.  Eyes:     Pupils: Pupils are equal, round, and reactive to light.  Cardiovascular:     Rate and Rhythm: Normal rate and regular rhythm.     Pulses: Normal pulses.     Heart sounds: Normal heart sounds. No murmur heard.  No friction rub. No gallop.   Pulmonary:     Effort: Pulmonary effort is normal.     Breath sounds: Normal breath sounds.  Abdominal:     General: Abdomen is flat. Bowel sounds are normal. There is no distension.     Palpations: Abdomen is soft.     Tenderness: There is no abdominal tenderness. There is left CVA tenderness. There is no guarding or rebound.     Comments: Mild left CVA tenderness  Musculoskeletal:     Cervical back: Neck supple.     Comments: Able to move all 4 extremities without difficulty.   Skin:    General: Skin is warm and dry.  Neurological:     General: No focal deficit present.     Mental Status: She is alert.     Comments: Cranial nerves grossly intact.  Strength equal bilaterally.  No facial droop.  Normal speech.  Psychiatric:        Mood and Affect: Mood normal.        Behavior: Behavior normal.     ED Results / Procedures / Treatments   Labs (all labs ordered are listed, but only abnormal results are displayed) Labs Reviewed  CBC WITH DIFFERENTIAL/PLATELET - Abnormal; Notable for the following components:      Result Value   RBC 3.58 (*)    Hemoglobin 10.3 (*)    HCT 32.5 (*)    RDW 15.9 (*)    Platelets 418 (*)  All other components within normal limits  COMPREHENSIVE METABOLIC PANEL - Abnormal; Notable for the following components:   Potassium 3.4 (*)    Glucose, Bld 103 (*)    Creatinine, Ser 1.02 (*)    Calcium 8.7 (*)    AST 51 (*)    All other components within normal limits  CBG MONITORING, ED - Abnormal; Notable for the following components:   Glucose-Capillary 109 (*)     All other components within normal limits  TROPONIN I (HIGH SENSITIVITY) - Abnormal; Notable for the following components:   Troponin I (High Sensitivity) 72 (*)    All other components within normal limits  TROPONIN I (HIGH SENSITIVITY) - Abnormal; Notable for the following components:   Troponin I (High Sensitivity) 63 (*)    All other components within normal limits  SARS CORONAVIRUS 2 BY RT PCR (HOSPITAL ORDER, [facility])  CULTURE, BLOOD (ROUTINE X 2)  CULTURE, BLOOD (ROUTINE X 2)  LACTIC ACID, PLASMA  I-STAT BETA HCG BLOOD, ED ([facility], [facility], [facility] ONLY)    EKG None  Radiology CT ABDOMEN PELVIS W CONTRAST  Result Date: 07/14/2019 CLINICAL DATA:  Blood cultures drawn yesterday are positive for MRSA. Decreased urine output, abdominal pain and vomiting. EXAM: CT ABDOMEN AND PELVIS WITH CONTRAST TECHNIQUE: Multidetector CT imaging of the abdomen and pelvis was performed using the standard protocol following bolus administration of intravenous contrast. CONTRAST:  100 mL OMNIPAQUE IOHEXOL 300 MG/ML  SOLN COMPARISON:  CT abdomen and pelvis 11/24/2018 FINDINGS: Lower chest: Small areas of ground-glass attenuation are seen in medial aspects of both the right and left lower lobes. Lung bases otherwise clear. No pleural or pericardial effusion. Hepatobiliary: No focal liver abnormality is seen. No gallstones, gallbladder wall thickening, or biliary dilatation. The liver is mildly low attenuating consistent with fatty infiltration. Pancreas: The pancreas is atrophic. No mass or surrounding inflammatory change. Spleen: Normal in size without focal abnormality. Adrenals/Urinary Tract: Adrenal glands are unremarkable. Kidneys are normal, without renal calculi, focal lesion, or hydronephrosis. Bladder is unremarkable. Stomach/Bowel: Stomach is within normal limits. Status post appendectomy. No evidence of bowel wall thickening, distention, or inflammatory changes. Vascular/Lymphatic:  Aortic atherosclerosis. No enlarged abdominal or pelvic lymph nodes. Reproductive: Uterus and bilateral adnexa are unremarkable. Other: None. Musculoskeletal: No acute or focal abnormality. Marked degenerative disease L3-4 and L4-5 and straightening of lumbar lordosis appear unchanged. IMPRESSION: No acute abnormality abdomen or pelvis. Small areas of ground-glass attenuation in the medial aspect of both the right and left lower lobes could be due to inflammatory change atelectasis. Mild fatty infiltration of the liver. Atherosclerosis. Electronically Signed   By: Inge Rise M.D.   On: 07/14/2019 14:10   DG Chest Port 1 View  Result Date: 07/13/2019 CLINICAL DATA:  Chest pain fever. EXAM: PORTABLE CHEST 1 VIEW COMPARISON:  Most recent radiograph 06/06/2019, CT same day FINDINGS: Post median sternotomy and CABG.The cardiomediastinal contours are normal. Coronary stents are visualized. The lungs are clear. Pulmonary vasculature is normal. No consolidation, pleural effusion, or pneumothorax. No acute osseous abnormalities are seen. IMPRESSION: No acute chest findings. Electronically Signed   By: Keith Rake M.D.   On: 07/13/2019 01:22    Procedures Procedures (including critical care time)  Medications Ordered in ED Medications  sodium chloride 0.9 % bolus 1,000 mL (0 mLs Intravenous Stopped 07/14/19 1243)  vancomycin (VANCOREADY) IVPB 1500 mg/300 mL (0 mg Intravenous Stopped 07/14/19 1238)  potassium chloride SA (KLOR-CON) CR tablet 40 mEq (40 mEq  Oral Given 07/14/19 1242)  iohexol (OMNIPAQUE) 300 MG/ML solution 100 mL (100 mLs Intravenous Contrast Given 07/14/19 1337)    ED Course  I have reviewed the triage vital signs and the nursing notes.  Pertinent labs & imaging results that were available during my care of the patient were reviewed by me and considered in my medical decision making (see chart for details).  Clinical Course as of Jul 14 1639  Mon Jul 14, 2019  0943 Discussed case  with Apolonio Schneiders in pharmacy who will put antibiotics in for patient given her positive blood cultures   [CA]  1025 Discussed case with Dr. Trilby Drummer who agrees to accept patient for further treatment   [CA]  1212 Troponin I (High Sensitivity)(!): 72 [CA]    Clinical Course User Index [CA] Suzy Bouchard, PA-C   MDM Rules/Calculators/A&P                         49 year old female presents to the ED due to positive blood cultures that grew MRSA.  Patient was seen in the ED yesterday due to multiple complaints of abdominal pain, decreased urinary output, hyperglycemia, and emesis.  She admits to intermittent marijuana use.  Also being treated for UTI with Keflex since Monday; however, due to her emesis has not been able to tolerate the medication since Wednesday.  On arrival, patient with low-grade fever at 99.7 F and tachycardic at 105, but otherwise normal vitals.  Patient had been waiting over 10 hours prior to my initial evaluation.  Patient in no acute distress and nontoxic-appearing.  Reviewed labs and notes from ED visit yesterday.  Will redraw blood cultures and lactic acid.  Also will add troponin given patient's chest pain on Friday. Discussed case with Apolonio Schneiders in pharmacy.  See note above.  Will start IV fluids and IV vancomycin.  Routine labs ordered at triage. Discussed case with Dr. Sherry Ruffing who agrees with assessment and plan.   CBC reassuring with no leukocytosis and mild anemia with hemoglobin at 10.3.  CMP reassuring with mild elevated creatinine at 1.02, hypokalemia at 3.4, and elevated AST at 51.  Pregnancy test negative.  Discussed case with Dr. Trilby Drummer who agrees to admit patient for further treatment. CT abdomen ordered per his request.  12:27 PM discussed case with Dr. Trilby Drummer who believes original blood cultures were contaminated.  Dr. Trilby Drummer discussed case with ID who recommends outpatient therapy for UTI and redraw blood cultures. Please see his note for full evaluation and  assessment. Patient's troponin elevated at 72. Will obtain delta troponin and consult cardiology given patient's history.   Delta troponin downtrending. Low suspicion for ACS. Presentation non-concerning for PE/DVT or dissection. Patient has been chest pain free since Friday.  Cardiology evaluated patient at bedside and feels patient may be discharged from a cardiology standpoint.  Please see their note for full assessment. CT abdomen personally reviewed which demonstrates: IMPRESSION:  No acute abnormality abdomen or pelvis.    Small areas of ground-glass attenuation in the medial aspect of both  the right and left lower lobes could be due to inflammatory change  atelectasis.    Mild fatty infiltration of the liver.    Atherosclerosis.   No symptoms to suggest PNA. Will discharge patient with PCP follow-up. Patient informed that she may be called back to the ED if her blood cultures are positive. Patient stable for discharge and agreeable to plan. Strict ED precautions discussed with patient. Patient states understanding  and agrees to plan. Patient discharged home in no acute distress and stable vitals.  Final Clinical Impression(s) / ED Diagnoses Final diagnoses:  Positive blood cultures  Nonspecific chest pain    Rx / DC Orders ED Discharge Orders    None       Suzy Bouchard, PA-C 07/14/19 1032    Suzy Bouchard, [place] 07/14/19 1642    Tegeler, Gwenyth Allegra, MD 07/15/19 1140

## 2019-07-14 NOTE — Consult Note (Signed)
Date: 07/14/2019               Patient Name:  Jennifer Hogan MRN: [id number]  DOB: [birth year] Age / Sex: 49 y.o., female   PCP: [name], NP         Requesting Physician: Dr. Sherry Ruffing, Gwenyth Allegra, *    Consulting Reason:  Positive blood cultures     Chief Complaint: Positive Blood Cultures  History of Present Illness: Jennifer Hogan is a 49 yo F with a Hx of CAD (s/p DES x3), Hypothyroidism, Depression, Type 1 Diabetes, and gastroparesis who presents to the ED due to positive blood cultures from the day prior. She was seen on 6/20 for abdominal pain, nausea/vomiting, and Fever. She was concerned about DKA as she had similar symptoms before. She was febrile, tachycardic, and tachypnic in ED, she had been taking antibiotics outpatient for a UTI, but only was able to tolerate 2 days worth due to her N/V. She was treated with 2L IVF, antibiotics and improved while in the ED with no signs of DKA. She was discharged home. Later, blood cultures obtained during that encounter returned positive for MRSA in 2/4 bottles. Patient has been afebrile and tolerating PO since discharge and is non-toxic on exam. She denies further N/V, fevers, Abdominal pain, constipation, or diarrhea. Work up in ED reassuring. K mildly low, borderline hsTrop at 73.  Meds: No current facility-administered medications on file prior to encounter.   Current Outpatient Medications on File Prior to Encounter  Medication Sig   aspirin 81 MG EC tablet Take 1 tablet (81 mg total) by mouth daily.   atorvastatin (LIPITOR) 20 MG tablet Take 1 tablet (20 mg total) by mouth daily. (Patient taking differently: Take 20 mg by mouth at bedtime. )   clopidogrel (PLAVIX) 75 MG tablet Take 1 tablet (75 mg total) by mouth daily.   FLUoxetine (PROZAC) 40 MG capsule Take 1 capsule (40 mg total) by mouth daily.   fluticasone (FLONASE) 50 MCG/ACT nasal spray Place 1 spray into both nostrils at bedtime.   ipratropium (ATROVENT) 0.06  % nasal spray Place 2 sprays into both nostrils 2 (two) times daily as needed for rhinitis.   isosorbide mononitrate (IMDUR) 30 MG 24 hr tablet Take 0.5 tablets (15 mg total) by mouth daily.   levothyroxine (EUTHYROX) 100 MCG tablet Take 1 tablet (100 mcg total) by mouth daily before breakfast.   metoCLOPramide (REGLAN) 5 MG tablet Take 1 tablet (5 mg total) by mouth every 12 (twelve) hours as needed for nausea or vomiting.   montelukast (SINGULAIR) 10 MG tablet Take 1 tablet (10 mg total) by mouth at bedtime.   nitroGLYCERIN (NITROSTAT) 0.4 MG SL tablet Place 1 tablet (0.4 mg total) under the tongue every 5 (five) minutes as needed for chest pain.   ondansetron (ZOFRAN ODT) 4 MG disintegrating tablet Take 1 tablet (4 mg total) by mouth every 8 (eight) hours as needed for nausea or vomiting.   pantoprazole (PROTONIX) 40 MG tablet Take 1 tablet (40 mg total) by mouth 2 (two) times daily as needed (acid reflux). (Patient taking differently: Take 40 mg by mouth daily. )   acetaminophen (TYLENOL) 325 MG tablet Take 650 mg by mouth daily as needed (couch/pain/fever).   albuterol (VENTOLIN HFA) 108 (90 Base) MCG/ACT inhaler INHALE 2 PUFFS INTO THE LUNGS EVERY 6 (SIX) HOURS AS NEEDED FOR WHEEZING OR SHORTNESS OF BREATH.   b complex vitamins tablet Take 1 tablet by mouth daily.   benzonatate (  TESSALON) 200 MG capsule Take 1 capsule (200 mg total) by mouth 2 (two) times daily as needed for cough.   cephALEXin (KEFLEX) 500 MG capsule Take 1 capsule (500 mg total) by mouth 2 (two) times daily.   diclofenac Sodium (VOLTAREN) 1 % GEL Apply 2 g topically 4 (four) times daily. (Patient taking differently: Apply 2 g topically daily as needed (For pain). )   ferrous sulfate 325 (65 FE) MG tablet Take 325 mg by mouth See admin instructions. Take one tablet (325 mg) by mouth once or twice during menstrual cycle   hydrOXYzine (ATARAX/VISTARIL) 25 MG tablet Take 1 tablet (25 mg total) by mouth 3 (three)  times daily as needed. (Patient taking differently: Take 25 mg by mouth 3 (three) times daily as needed for anxiety. )   insulin aspart (NOVOLOG) 100 UNIT/ML injection 35 units per insulin pump daily; MUST KEEP APPT FOR FUTURE REFILLS TO BE AUTHORIZED (Patient not taking: Reported on 06/06/2019)   Insulin Human (INSULIN PUMP) SOLN Inject into the skin continuous. Humalog   insulin lispro (HUMALOG) 100 UNIT/ML injection Inject 0.35 mLs (35 Units total) into the skin See admin instructions. 35 units daily continues insulin pump (Patient not taking: Reported on 06/06/2019)   LORazepam (ATIVAN) 0.5 MG tablet Take 1 tablet (0.5 mg total) by mouth every 8 (eight) hours as needed for anxiety. (Patient not taking: Reported on 07/14/2019)   Phenyleph-Doxyl-DM-Aspirin (ALKA-SELTZER PLUS COLD DY/NGHT PO) Take 1 tablet by mouth at bedtime as needed (cough).   QUEtiapine (SEROQUEL) 50 MG tablet Take 1 tablet (50 mg total) by mouth at bedtime. May increase to 2 tablets or 100 mg at bedtime after 2 weeks if needed (Patient not taking: Reported on 06/03/2019)   Allergies: Allergies as of 07/13/2019 - Review Complete 07/13/2019  Allergen Reaction Noted   Atorvastatin Other (See Comments) 02/19/2019   Crestor [rosuvastatin calcium] Other (See Comments) 10/31/2018   Monosodium glutamate Nausea And Vomiting and Other (See Comments) 06/06/2019   Peanut-containing drug products Other (See Comments) 10/30/2018   Zetia [ezetimibe] Other (See Comments) 02/19/2019   Ciprofloxin hcl [ciprofloxacin] Nausea And Vomiting and Other (See Comments) 07/31/2012   Food color red [red dye] Diarrhea 07/31/2012   Past Medical History:  Diagnosis Date   Anemia    Anxiety    Coronary artery disease    a. s/pPTCA DES x3 in the LAD (ostial DES placed, mid DES placed, distal DES placed)and PTCA/DES x1to intermediate branch 02/2017. b. ACS 02/08/18 with LCx stent placement.   Depression    Diabetes mellitus    type 1,  on insulin pump   DKA (diabetic ketoacidoses) ([place]) 12/31/2017   Elevated platelet count    High cholesterol    Hypothyroidism    Ischemic cardiomyopathy    a. EF low-normal with basal inferior akinesis, basal septal hypokinesis by echo 01/2018.   Family History:  Family History  Problem Relation Age of Onset   Coronary artery disease Father    Congestive Heart Failure Father    Asthma Father    Cancer Mother        T cell lymphoma   Social History:  Social History   Tobacco Use   Smoking status: Never Smoker   Smokeless tobacco: Never Used   Tobacco comment: Use marijuana  Vaping Use   Vaping Use: Never used  Substance Use Topics   Alcohol use: No    Alcohol/week: 0.0 standard drinks   Drug use: Not Currently    Types: Marijuana  Comment: last use last week, occaisonal marijuana use   Review of Systems: A complete ROS was negative except as per HPI.  Physical Exam: Blood pressure 122/77, pulse 69, temperature 99.1 F (37.3 C), temperature source Oral, resp. rate 18, height 5\' 5"  (1.651 m), weight 75 kg, SpO2 100 %. Physical Exam Constitutional:      General: She is not in acute distress.    Appearance: Normal appearance.  Cardiovascular:     Rate and Rhythm: Normal rate and regular rhythm.     Pulses: Normal pulses.     Heart sounds: Normal heart sounds.  Pulmonary:     Effort: Pulmonary effort is normal. No respiratory distress.     Breath sounds: Normal breath sounds.  Abdominal:     General: Bowel sounds are normal. There is no distension.     Palpations: Abdomen is soft.     Tenderness: There is no abdominal tenderness.  Musculoskeletal:        General: No swelling or deformity.  Skin:    General: Skin is warm and dry.  Neurological:     General: No focal deficit present.     Mental Status: Mental status is at baseline.     EKG: Not yet performed.  CXR: Performed 6/20 personally reviewed my interpretation is no acute  disease  Assessment & Plan by Problem:  2/4 Positive Blood Cultures, 2/4 Blood cultures positive for Methicillin resistant Coagulase negative staph. Both were from the same set. This likely represents a contaminant in the setting of no clear source of infection beyond UTI which is responding to current therapy. She is tolerating PO well and should be able to be discharged on current regimen. Discussed with Dr. Megan Salon of ID by phone who agrees. Will obtain repeat cultures while in the ED to be safe, but recommend discharge home. - Discharge from ED, pending troponin below - Repeat Blood Cultures x2  HypoKalemia: likely from increased insulin dose from Pump as patient was treating her elevated CBGs prior to 6/20 visit - 40mg  PO KCl  UTI: Currently being treated for UTI that was incompletely treated due to N/V and not being able to tolerate PO. Cultures obtained after abx. - Conitnue Keflex as prescribed  CAD: S/p Stent x 4 in 2019 and x1 in 2010. S/p CABG x 3 in Oct 2020. HsTrop Mildly elevated to 73 in ED. - Recheck delta hsTroponin, Discharge if stable.  Type 1 Diabetes, Gastroparesis: Hypothyroidism Depression: Continue home regimens  Signed: Neva Seat, MD 07/14/2019, 11:53 AM

## 2019-07-15 LAB — CULTURE, BLOOD (ROUTINE X 2): Special Requests: ADEQUATE

## 2019-07-16 ENCOUNTER — Telehealth: Payer: Self-pay

## 2019-07-16 NOTE — Telephone Encounter (Signed)
+  BC  Likely contaninent per Pharm D   Pt has already returned to ED

## 2019-07-17 ENCOUNTER — Encounter: Payer: Self-pay | Admitting: Nurse Practitioner

## 2019-07-18 ENCOUNTER — Other Ambulatory Visit: Payer: Self-pay | Admitting: Nurse Practitioner

## 2019-07-18 DIAGNOSIS — Z1211 Encounter for screening for malignant neoplasm of colon: Secondary | ICD-10-CM

## 2019-07-18 DIAGNOSIS — R112 Nausea with vomiting, unspecified: Secondary | ICD-10-CM

## 2019-07-18 DIAGNOSIS — K3184 Gastroparesis: Secondary | ICD-10-CM

## 2019-07-18 LAB — CULTURE, BLOOD (ROUTINE X 2): Culture: NO GROWTH

## 2019-07-19 LAB — CULTURE, BLOOD (ROUTINE X 2)
Culture: NO GROWTH
Culture: NO GROWTH
Special Requests: ADEQUATE
Special Requests: ADEQUATE

## 2019-07-21 ENCOUNTER — Other Ambulatory Visit: Payer: Self-pay | Admitting: Endocrinology

## 2019-07-21 MED FILL — ATORVASTATIN CALCIUM 20 MG: 20 | 30 days supply | Qty: 30 | Fill #1

## 2019-07-21 MED FILL — MONTELUKAST SOD 10 MG TAB: 10 | 30 days supply | Qty: 30 | Fill #3

## 2019-07-31 ENCOUNTER — Encounter: Payer: Self-pay | Admitting: Endocrinology

## 2019-07-31 MED FILL — ?LEVOTHYROXINE SODIUM 100MC: 100 | 30 days supply | Qty: 30 | Fill #1

## 2019-07-31 MED FILL — ALBUTEROL SULFATE HFA 108 (: 108 (90 BAS | 25 days supply | Qty: 9 | Fill #1

## 2019-08-01 ENCOUNTER — Other Ambulatory Visit: Payer: Self-pay

## 2019-08-01 DIAGNOSIS — E119 Type 2 diabetes mellitus without complications: Secondary | ICD-10-CM

## 2019-08-01 MED ORDER — INSULIN ASPART 100 UNIT/ML ~~LOC~~ SOLN: SUBCUTANEOUS | 0 refills | Status: DC

## 2019-08-01 NOTE — Progress Notes (Signed)
Novolog sent in.  NO further RX will be sent in.  Patient must keep her appointment.

## 2019-08-04 ENCOUNTER — Other Ambulatory Visit: Payer: Self-pay

## 2019-08-04 ENCOUNTER — Other Ambulatory Visit: Payer: Self-pay | Admitting: Endocrinology

## 2019-08-04 ENCOUNTER — Telehealth (INDEPENDENT_AMBULATORY_CARE_PROVIDER_SITE_OTHER): Payer: Self-pay | Admitting: Endocrinology

## 2019-08-04 ENCOUNTER — Telehealth: Payer: Self-pay | Admitting: Pharmacist

## 2019-08-04 DIAGNOSIS — E109 Type 1 diabetes mellitus without complications: Secondary | ICD-10-CM

## 2019-08-04 DIAGNOSIS — E039 Hypothyroidism, unspecified: Secondary | ICD-10-CM

## 2019-08-04 MED ORDER — LEVOTHYROXINE SODIUM 100 MCG PO TABS: 100.0000 ug | ORAL_TABLET | Freq: Every day | ORAL | 3 refills | Status: DC

## 2019-08-04 MED FILL — HumaLOG 100 UNIT/ML SOLN: 100 | 28 days supply | Qty: 10 | Fill #0

## 2019-08-04 NOTE — Patient Instructions (Addendum)
Please continue the same pump settings. Please come back for a follow-up appointment in 6 weeks.

## 2019-08-04 NOTE — Telephone Encounter (Signed)
Received notification today that patient was approved for the PASS program to receive Praluent for free. We had applied for patient > 6 months ago. Patient is still interested in starting. She is taking atorvastatin 10mg  daily and tolerating fine. I advised that she should continue to take this. If shipment comes soon, can do labs at apt in Aug with Selinda Eon. If not, will schedule for further out.

## 2019-08-04 NOTE — Progress Notes (Signed)
Subjective:    [name], female    DOB: 07-04-1970, 49 y.o.   MRN: [id number]  HPI  telehealth visit today via video visit.  Alternatives to telehealth are presented to this patient, and the patient agrees to the telehealth visit. Pt is advised of the cost of the visit, and agrees to this, also.   Patient is at home, and I am at the office.   Persons attending the telehealth visit: the patient and I Pt returns for f/u of diabetes mellitus:  DM type: 1  Dx'ed: 6378 Complications: CAD.   Therapy: insulin since dx.  DKA: twice (2020 and 2021) Severe hypoglycemia: never.  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   Interval history: She now takes:   basal rate of 0.7 units/hr, 6 AM-10 AM, 0.9 units/hr, 10 AM-10 PM, and 0.625 units/hr overnight.   bolus of 1 unit/17 grams CHO. correction bolus (which some people call "sensitivity," or "insulin sensitivity ratio," or just "isr") of 1 unit for each 60 by which your glucose exceeds 100.   TDD is 22 units per day (approx 17% bolus).   She is appealing medicaid decision.  She was rx'ed for URI, but she did not receive steroid rx.  She did not see GI, due to cost.  Meter is downloaded today, and the printout is scanned into the record.  cbg varies from 120-270, but she says she has intermitt mild hypoglycemia.  She started continuous glucose monitor 2 days ago.  She takes 2-3 mealtime boluses per day.   Past Medical History:  Diagnosis Date   Anemia    Anxiety    Coronary artery disease    a. s/pPTCA DES x3 in the LAD (ostial DES placed, mid DES placed, distal DES placed)and PTCA/DES x1to intermediate branch 02/2017. b. ACS 02/08/18 with LCx stent placement.   Depression    Diabetes mellitus    type 1, on insulin pump   DKA (diabetic ketoacidoses) ([place]) 12/31/2017   Elevated platelet  count    High cholesterol    Hypothyroidism    Ischemic cardiomyopathy    a. EF low-normal with basal inferior akinesis, basal septal hypokinesis by echo 01/2018.    Past Surgical History:  Procedure Laterality Date   APPENDECTOMY     CORONARY ARTERY BYPASS GRAFT N/A 11/04/2018   Procedure: CORONARY ARTERY BYPASS GRAFTING (CABG), ON PUMP, TIMES THREE, USING LEFT AND RIGHT INTERNAL MAMMARY ARTERIES;  Surgeon: Wonda Olds, MD;  Location: [facility];  Service: Open Heart Surgery;  Laterality: N/A;   CORONARY STENT INTERVENTION N/A 03/20/2017   Procedure: DES x 3 LAD, DES OM; Surgeon: Burnell Blanks, MD;  Location: [place] [facility];  Service: Cardiovascular;  Laterality: N/A;   CORONARY/GRAFT ACUTE MI REVASCULARIZATION N/A 02/08/2018   Procedure: Coronary/Graft Acute MI Revascularization;  Surgeon: Belva Crome, MD;  Location: [place];  Service: Cardiovascular;  Laterality: N/A;   LEFT HEART CATH AND CORONARY ANGIOGRAPHY N/A 03/20/2017   Procedure: LEFT HEART CATH AND CORONARY ANGIOGRAPHY;  Surgeon: Burnell Blanks, MD;  Location: [place] [facility];  Service: Cardiovascular;  Laterality: N/A;   LEFT HEART CATH AND CORONARY ANGIOGRAPHY N/A 02/08/2018   Procedure: LEFT HEART CATH AND CORONARY ANGIOGRAPHY;  Surgeon: Belva Crome, MD;  Location: [place] [facility];  Service: Cardiovascular;  Laterality:  N/A;   LEFT HEART CATH AND CORONARY ANGIOGRAPHY N/A 10/29/2018   Procedure: LEFT HEART CATH AND CORONARY ANGIOGRAPHY;  Surgeon: Burnell Blanks, MD;  Location: [place] [facility];  Service: Cardiovascular;  Laterality: N/A;   TEE WITHOUT CARDIOVERSION N/A 11/04/2018   Procedure: TRANSESOPHAGEAL ECHOCARDIOGRAM (TEE);  Surgeon: Wonda Olds, MD;  Location: [facility];  Service: Open Heart Surgery;  Laterality: N/A;    Social History   Socioeconomic History   Marital status: Divorced    Spouse name: Not on file   Number of children: Not on file    Years of education: Not on file   Highest education level: Not on file  Occupational History   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"  Tobacco Use   Smoking status: Never Smoker   Smokeless tobacco: Never Used   Tobacco comment: Use marijuana  Vaping Use   Vaping Use: Never used  Substance and Sexual Activity   Alcohol use: No    Alcohol/week: 0.0 standard drinks   Drug use: Not Currently    Types: Marijuana    Comment: last use last week, occaisonal marijuana use   Sexual activity: Not Currently    Birth control/protection: None  Other Topics Concern   Not on file  Social History Narrative   Not on file   Social Determinants of Health   Financial Resource Strain:    Difficulty of Paying Living Expenses:   Food Insecurity:    Worried About Charity fundraiser in the Last Year:    Arboriculturist in the Last Year:   Transportation Needs:    Film/video editor (Medical):    Lack of Transportation (Non-Medical):   Physical Activity:    Days of Exercise per Week:    Minutes of Exercise per Session:   Stress:    Feeling of Stress :   Social Connections:    Frequency of Communication with Friends and Family:    Frequency of Social Gatherings with Friends and Family:    Attends Religious Services:    Active Member of Clubs or Organizations:    Attends Music therapist:    Marital Status:   Intimate Partner Violence:    Fear of Current or Ex-Partner:    Emotionally Abused:    Physically Abused:    Sexually Abused:     Current Outpatient Medications on File Prior to Visit  Medication Sig Dispense Refill   acetaminophen (TYLENOL) 325 MG tablet Take 650 mg by mouth daily as needed (couch/pain/fever).     albuterol (VENTOLIN HFA) 108 (90 Base) MCG/ACT inhaler INHALE 2 PUFFS INTO THE LUNGS EVERY 6 (SIX) HOURS AS NEEDED FOR WHEEZING OR SHORTNESS OF BREATH. 8.5 g 1   aspirin 81 MG EC tablet Take 1 tablet (81 mg total) by  mouth daily. 90 tablet 0   atorvastatin (LIPITOR) 20 MG tablet Take 1 tablet (20 mg total) by mouth daily. (Patient taking differently: Take 20 mg by mouth at bedtime. ) 90 tablet 3   b complex vitamins tablet Take 1 tablet by mouth daily.     benzonatate (TESSALON) 200 MG capsule Take 1 capsule (200 mg total) by mouth 2 (two) times daily as needed for cough. 60 capsule 6   cephALEXin (KEFLEX) 500 MG capsule Take 1 capsule (500 mg total) by mouth 2 (two) times daily. 14 capsule 0   clopidogrel (PLAVIX) 75 MG tablet Take 1 tablet (75 mg total) by mouth daily. 90 tablet 3  diclofenac Sodium (VOLTAREN) 1 % GEL Apply 2 g topically 4 (four) times daily. (Patient taking differently: Apply 2 g topically daily as needed (For pain). ) 200 g 3   ferrous sulfate 325 (65 FE) MG tablet Take 325 mg by mouth See admin instructions. Take one tablet (325 mg) by mouth once or twice during menstrual cycle     FLUoxetine (PROZAC) 40 MG capsule Take 1 capsule (40 mg total) by mouth daily. 90 capsule 3   fluticasone (FLONASE) 50 MCG/ACT nasal spray Place 1 spray into both nostrils at bedtime.     hydrOXYzine (ATARAX/VISTARIL) 25 MG tablet Take 1 tablet (25 mg total) by mouth 3 (three) times daily as needed. (Patient taking differently: Take 25 mg by mouth 3 (three) times daily as needed for anxiety. ) 60 tablet 1   insulin aspart (NOVOLOG) 100 UNIT/ML injection 35 units per insulin pump daily; MUST KEEP APPT FOR FUTURE REFILLS TO BE AUTHORIZED 10 mL 0   Insulin Human (INSULIN PUMP) SOLN Inject into the skin continuous. Humalog     ipratropium (ATROVENT) 0.06 % nasal spray Place 2 sprays into both nostrils 2 (two) times daily as needed for rhinitis.     isosorbide mononitrate (IMDUR) 30 MG 24 hr tablet Take 0.5 tablets (15 mg total) by mouth daily. 45 tablet 3   LORazepam (ATIVAN) 0.5 MG tablet Take 1 tablet (0.5 mg total) by mouth every 8 (eight) hours as needed for anxiety. (Patient not taking: Reported on  07/14/2019) 10 tablet 0   metoCLOPramide (REGLAN) 5 MG tablet Take 1 tablet (5 mg total) by mouth every 12 (twelve) hours as needed for nausea or vomiting. 90 tablet 1   montelukast (SINGULAIR) 10 MG tablet Take 1 tablet (10 mg total) by mouth at bedtime. 90 tablet 2   nitroGLYCERIN (NITROSTAT) 0.4 MG SL tablet Place 1 tablet (0.4 mg total) under the tongue every 5 (five) minutes as needed for chest pain. 30 tablet 0   ondansetron (ZOFRAN ODT) 4 MG disintegrating tablet Take 1 tablet (4 mg total) by mouth every 8 (eight) hours as needed for nausea or vomiting. 20 tablet 1   pantoprazole (PROTONIX) 40 MG tablet Take 1 tablet (40 mg total) by mouth 2 (two) times daily as needed (acid reflux). (Patient taking differently: Take 40 mg by mouth daily. ) 60 tablet 3   Phenyleph-Doxyl-DM-Aspirin (ALKA-SELTZER PLUS COLD DY/NGHT PO) Take 1 tablet by mouth at bedtime as needed (cough).     QUEtiapine (SEROQUEL) 50 MG tablet Take 1 tablet (50 mg total) by mouth at bedtime. May increase to 2 tablets or 100 mg at bedtime after 2 weeks if needed (Patient not taking: Reported on 06/03/2019) 90 tablet 0   No current facility-administered medications on file prior to visit.    Allergies  Allergen Reactions   Atorvastatin Other (See Comments)    Myalgias on 80mg  dose, tolerates 20mg  ok   Crestor [Rosuvastatin Calcium] Other (See Comments)    Severe myalgias and joint pain. Has tolerated atorvastatin though   Monosodium Glutamate Nausea And Vomiting and Other (See Comments)    migraine   Peanut-Containing Drug Products Other (See Comments)    Vomiting, upset stomach and some wheezing   Zetia [Ezetimibe] Other (See Comments)    myalgias   Ciprofloxin Hcl [Ciprofloxacin] Nausea And Vomiting and Other (See Comments)    Severe migraine   Food Color Red [Red Dye] Diarrhea    Family History  Problem Relation Age of Onset   Coronary  artery disease Father    Congestive Heart Failure Father     Asthma Father    Cancer Mother        T cell lymphoma    There were no vitals taken for this visit.   Review of Systems She denies LOC    Objective:   Physical Exam   Lab Results  Component Value Date   CREATININE 1.02 (H) 07/14/2019   BUN 8 07/14/2019   NA 138 07/14/2019   K 3.4 (L) 07/14/2019   CL 106 07/14/2019   CO2 23 07/14/2019        Assessment & Plan:  Type 1 DM: uncertain glycemic control Hypoglycemia, due to insulin: this limits aggressiveness of glycemic control   Patient Instructions  Please continue the same pump settings. Please come back for a follow-up appointment in 6 weeks.

## 2019-08-06 ENCOUNTER — Ambulatory Visit: Payer: Self-pay | Attending: Nurse Practitioner | Admitting: Licensed Clinical Social Worker

## 2019-08-06 DIAGNOSIS — F331 Major depressive disorder, recurrent, moderate: Secondary | ICD-10-CM

## 2019-08-06 DIAGNOSIS — F411 Generalized anxiety disorder: Secondary | ICD-10-CM

## 2019-08-07 ENCOUNTER — Other Ambulatory Visit: Payer: Self-pay

## 2019-08-13 NOTE — Progress Notes (Signed)
Cardiology Office Note    Date:  08/18/2019   ID:  Jennifer Hogan, DOB 13-Mar-1970, MRN [id number]  PCP:  [name], NP  Cardiologist: Fransico Him, MD EPS: None  No chief complaint on file.   History of Present Illness:  Jennifer Hogan is a 49 y.o. female with a hx of  type 1 diabetes mellitus on chronic insulin pump, hypothyroidism and ischemic dilated cardiomyopathy EF 45 to 50% with apical hypokinesis.  She has severe 3 vessel ASCAD s/p NSTEMI with cath showing severe three-vessel CAD status post PTCA DES x3 in the LAD(ostial DES placed, mid DES placed, distal DES placed) and  PTCA/DES x 1 intermediate branch. She had issues with orthostatic hypotension, thus no ACE/ARB was added.    In January 2020 presented with STEMI and underwent stenting of ostial to proximal RCA. She had residual moderate disease in the mid LAD between stents and a ostial ramus branch and was treated medically.  She was readmitted 10/29/2018 with N/V and abdominal pain and then developed CP, weakness and lightheadedness.  She presented to ER hypotensive with SBP 67mmHg and responded to IVF.  She ruled in for MI with Trop of 1138 and underwent cath showing significant stenosis in RI and 60% mLAD.  She ultimately underwent CABG with LIMA>LAD and RI, RIMA>OM.     Last saw Dr. Radford Pax 01/08/19 and BP was low so she stopped lopressor.  Patient has had multiple admissions for DKA in the setting of uncontrolled type 1 diabetes.  She was admitted to the hospital 07/13/2019 with intermittent chest pain vomiting hyperglycemia and decreased urine output.  She was also tachycardic EKG sinus tachycardia at 121 bpm no acute change.  After discharge from the ED she was called her blood culture was positive for MRSA and was sent back to the ER but then determined it was contaminated.  High sensitive troponin was checked minimally elevated at 72 and 63.  Continue DAPT with aspirin and Plavix.  Patient comes in for f/u.  Dizzy all the time, worse with change in position. Very active taking care of horses, volunteering at the [employer] and very dizzy in the heat.   Past Medical History:  Diagnosis Date  . Anemia   . Anxiety   . Coronary artery disease    a. s/pPTCA DES x3 in the LAD (ostial DES placed, mid DES placed, distal DES placed)and PTCA/DES x1to intermediate branch 02/2017. b. ACS 02/08/18 with LCx stent placement.  . Depression   . Diabetes mellitus    type 1, on insulin pump  . DKA (diabetic ketoacidoses) ([place]) 12/31/2017  . Elevated platelet count   . High cholesterol   . Hypothyroidism   . Ischemic cardiomyopathy    a. EF low-normal with basal inferior akinesis, basal septal hypokinesis by echo 01/2018.    Past Surgical History:  Procedure Laterality Date  . APPENDECTOMY    . CORONARY ARTERY BYPASS GRAFT N/A 11/04/2018   Procedure: CORONARY ARTERY BYPASS GRAFTING (CABG), ON PUMP, TIMES THREE, USING LEFT AND RIGHT INTERNAL MAMMARY ARTERIES;  Surgeon: Wonda Olds, MD;  Location: [facility];  Service: Open Heart Surgery;  Laterality: N/A;  . CORONARY STENT INTERVENTION N/A 03/20/2017   Procedure: DES x 3 LAD, DES OM; Surgeon: Burnell Blanks, MD;  [facility];  Service: Cardiovascular;  Laterality: N/A;  . CORONARY/GRAFT ACUTE MI REVASCULARIZATION N/A 02/08/2018   Procedure: Coronary/Graft Acute MI Revascularization;  Surgeon: Belva Crome, MD;  Location: [facility]  INVASIVE [facility];  Service: Cardiovascular;  Laterality: N/A;  . LEFT HEART CATH AND CORONARY ANGIOGRAPHY N/A 03/20/2017   Procedure: LEFT HEART CATH AND CORONARY ANGIOGRAPHY;  Surgeon: Burnell Blanks, MD;  Location: [facility];  Service: Cardiovascular;  Laterality: N/A;  . LEFT HEART CATH AND CORONARY ANGIOGRAPHY N/A 02/08/2018   Procedure: LEFT HEART CATH AND CORONARY ANGIOGRAPHY;  Surgeon: Belva Crome, MD;  Location: [facility];  Service: Cardiovascular;  Laterality: N/A;  . LEFT HEART CATH  AND CORONARY ANGIOGRAPHY N/A 10/29/2018   Procedure: LEFT HEART CATH AND CORONARY ANGIOGRAPHY;  Surgeon: Burnell Blanks, MD;  Location: [facility];  Service: Cardiovascular;  Laterality: N/A;  . TEE WITHOUT CARDIOVERSION N/A 11/04/2018   Procedure: TRANSESOPHAGEAL ECHOCARDIOGRAM (TEE);  Surgeon: Wonda Olds, MD;  Location: [facility];  Service: Open Heart Surgery;  Laterality: N/A;    Current Medications: Current Meds  Medication Sig  . acetaminophen (TYLENOL) 325 MG tablet Take 650 mg by mouth daily as needed (couch/pain/fever).  Marland Kitchen albuterol (VENTOLIN HFA) 108 (90 Base) MCG/ACT inhaler INHALE 2 PUFFS INTO THE LUNGS EVERY 6 (SIX) HOURS AS NEEDED FOR WHEEZING OR SHORTNESS OF BREATH.  Marland Kitchen Alirocumab (PRALUENT) 75 MG/ML SOAJ Inject 1 pen into the skin every 14 (fourteen) days.  Marland Kitchen aspirin 81 MG EC tablet Take 1 tablet (81 mg total) by mouth daily.  Marland Kitchen b complex vitamins tablet Take 1 tablet by mouth daily.  . benzonatate (TESSALON) 200 MG capsule Take 1 capsule (200 mg total) by mouth 2 (two) times daily as needed for cough.  . cephALEXin (KEFLEX) 500 MG capsule Take 1 capsule (500 mg total) by mouth 2 (two) times daily.  . clopidogrel (PLAVIX) 75 MG tablet Take 1 tablet (75 mg total) by mouth daily.  . diclofenac Sodium (VOLTAREN) 1 % GEL Apply 2 g topically 4 (four) times daily. (Patient taking differently: Apply 2 g topically daily as needed (For pain). )  . ferrous sulfate 325 (65 FE) MG tablet Take 325 mg by mouth See admin instructions. Take one tablet (325 mg) by mouth once or twice during menstrual cycle  . FLUoxetine (PROZAC) 40 MG capsule Take 1 capsule (40 mg total) by mouth daily.  . fluticasone (FLONASE) 50 MCG/ACT nasal spray Place 1 spray into both nostrils at bedtime.  . hydrOXYzine (ATARAX/VISTARIL) 25 MG tablet Take 1 tablet (25 mg total) by mouth 3 (three) times daily as needed. (Patient taking differently: Take 25 mg by mouth 3 (three) times daily as needed for  anxiety. )  . insulin aspart (NOVOLOG) 100 UNIT/ML injection 35 units per insulin pump daily; MUST KEEP APPT FOR FUTURE REFILLS TO BE AUTHORIZED  . Insulin Human (INSULIN PUMP) SOLN Inject into the skin continuous. Humalog  . ipratropium (ATROVENT) 0.06 % nasal spray Place 2 sprays into both nostrils 2 (two) times daily as needed for rhinitis.  Marland Kitchen levothyroxine (EUTHYROX) 100 MCG tablet Take 1 tablet (100 mcg total) by mouth daily before breakfast.  . LORazepam (ATIVAN) 0.5 MG tablet Take 1 tablet (0.5 mg total) by mouth every 8 (eight) hours as needed for anxiety.  . metoCLOPramide (REGLAN) 5 MG tablet Take 1 tablet (5 mg total) by mouth every 12 (twelve) hours as needed for nausea or vomiting.  . montelukast (SINGULAIR) 10 MG tablet Take 1 tablet (10 mg total) by mouth at bedtime.  . nitroGLYCERIN (NITROSTAT) 0.4 MG SL tablet Place 1 tablet (0.4 mg total) under the tongue every 5 (five)  minutes as needed for chest pain.  Marland Kitchen ondansetron (ZOFRAN ODT) 4 MG disintegrating tablet Take 1 tablet (4 mg total) by mouth every 8 (eight) hours as needed for nausea or vomiting.  . pantoprazole (PROTONIX) 40 MG tablet Take 1 tablet (40 mg total) by mouth 2 (two) times daily as needed (acid reflux). (Patient taking differently: Take 40 mg by mouth as needed. Pt taking as needed for indigestion/acid reflux.)  . Phenyleph-Doxyl-DM-Aspirin (ALKA-SELTZER PLUS COLD DY/NGHT PO) Take 1 tablet by mouth at bedtime as needed (cough).  . [DISCONTINUED] isosorbide mononitrate (IMDUR) 30 MG 24 hr tablet Take 0.5 tablets (15 mg total) by mouth daily.     Allergies:   Atorvastatin, Crestor [rosuvastatin calcium], Monosodium glutamate, Peanut-containing drug products, Zetia [ezetimibe], Ciprofloxin hcl [ciprofloxacin], and Food color red [red dye]   Social History   Socioeconomic History  . Marital status: Divorced    Spouse name: Not on file  . Number of children: Not on file  . Years of education: Not on file  . Highest  education level: Not on file  Occupational History  . 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 Smoking status: Never Smoker  . Smokeless tobacco: Never Used  . Tobacco comment: Use marijuana  Vaping Use  . Vaping Use: Never used  Substance and Sexual Activity  . Alcohol use: No    Alcohol/week: 0.0 standard drinks  . Drug use: Not Currently    Types: Marijuana    Comment: last use last week, occaisonal marijuana use  . Sexual activity: Not Currently    Birth control/protection: None  Other Topics Concern  . Not on file  Social History Narrative  . Not on file   Social Determinants of Health   Financial Resource Strain:   . Difficulty of Paying Living Expenses:   Food Insecurity:   . Worried About Charity fundraiser in the Last Year:   . Arboriculturist in the Last Year:   Transportation Needs:   . Film/video editor (Medical):   Marland Kitchen Lack of Transportation (Non-Medical):   Physical Activity:   . Days of Exercise per Week:   . Minutes of Exercise per Session:   Stress:   . Feeling of Stress :   Social Connections:   . Frequency of Communication with Friends and Family:   . Frequency of Social Gatherings with Friends and Family:   . Attends Religious Services:   . Active Member of Clubs or Organizations:   . Attends Archivist Meetings:   Marland Kitchen Marital Status:      Family History:  The patient's   family history includes Asthma in her father; Cancer in her mother; Congestive Heart Failure in her father; Coronary artery disease in her father.   ROS:   Please see the history of present illness.    ROS All other systems reviewed and are negative.   PHYSICAL EXAM:   VS:  BP (!) 102/58   Pulse 101   Ht 5\' 5"  (1.651 m)   Wt 149 lb 9.6 oz (67.9 kg)   SpO2 99%   BMI 24.89 kg/m   Physical Exam  GEN: Thin, in no acute distress  Neck: no JVD, carotid bruits, or masses Cardiac:RRR; no murmurs, rubs, or gallops  Respiratory:  clear to  auscultation bilaterally, normal work of breathing GI: soft, nontender, nondistended, + BS Ext: without cyanosis, clubbing, or edema, Good distal pulses bilaterally Neuro:  Alert and Oriented x  3 Psych: euthymic mood, full affect  Wt Readings from Last 3 Encounters:  08/18/19 149 lb 9.6 oz (67.9 kg)  07/14/19 165 lb 5.5 oz (75 kg)  07/13/19 150 lb (68 kg)      Studies/Labs Reviewed:   EKG:  EKG is not ordered today.   Recent Labs: 11/05/2018: Magnesium 2.7 01/09/2019: TSH 1.06 07/14/2019: ALT 33; BUN 8; Creatinine, Ser 1.02; Hemoglobin 10.3; Platelets 418; Potassium 3.4; Sodium 138   Lipid Panel    Component Value Date/Time   CHOL 155 02/19/2019 0935   TRIG 91 02/19/2019 0935   HDL 63 02/19/2019 0935   CHOLHDL 2.5 02/19/2019 0935   CHOLHDL 3.2 02/08/2018 0517   VLDL 21 02/08/2018 0517   LDLCALC 75 02/19/2019 0935    Additional studies/ records that were reviewed today include:   Echo 11/2018  IMPRESSIONS     1. Left ventricular ejection fraction, by visual estimation, is 55 to  60%. The left ventricle has normal function. There is no left ventricular  hypertrophy.   2. Abnormal septal motion consistent with post-operative status.   3. Left ventricular diastolic parameters are indeterminate.   4. Very mild inferior wall motion hypokinesis, improved from prior.   5. Global right ventricle has normal systolic function.The right  ventricular size is normal. No increase in right ventricular wall  thickness.   6. Left atrial size was normal.   7. Right atrial size was normal.   8. Moderate pleural effusion in the left lateral region.   9. The mitral valve is normal in structure. Trace mitral valve  regurgitation.  10. The tricuspid valve is normal in structure. Tricuspid valve  regurgitation is trivial.  11. The aortic valve is tricuspid. Aortic valve regurgitation is not  visualized. Mild aortic valve sclerosis without stenosis.  12. The pulmonic valve was grossly  normal. Pulmonic valve regurgitation is  trivial.  13. Normal pulmonary artery systolic pressure.  14. The inferior vena cava is normal in size with greater than 50%  respiratory variability, suggesting right atrial pressure of 3 mmHg.   FINDINGS   Left Ventricle: Left ventricular ejection fraction, by visual estimation,  is 55 to 60%. The left ventricle has normal function. There is no left  ventricular hypertrophy. Abnormal (paradoxical) septal motion consistent  with post-operative status. Left  ventricular diastolic parameters are indeterminate. Very mild inferior  wall motion hypokinesis, improved from prior.   Right Ventricle: The right ventricular size is normal. No increase in  right ventricular wall thickness. Global RV systolic function is has  normal systolic function. The tricuspid regurgitant velocity is 2.24 m/s,  and with an assumed right atrial pressure   of 3 mmHg, the estimated right ventricular systolic pressure is normal at  23.1 mmHg.   Left Atrium: Left atrial size was normal in size.   Right Atrium: Right atrial size was normal in size   Pericardium: There is no evidence of pericardial effusion. There is a  moderate pleural effusion in the left lateral region.   Mitral Valve: The mitral valve is normal in structure. Trace mitral valve  regurgitation.   Tricuspid Valve: The tricuspid valve is normal in structure. Tricuspid  valve regurgitation is trivial.   Aortic Valve: The aortic valve is tricuspid. Aortic valve regurgitation is  not visualized. Mild aortic valve sclerosis is present, with no evidence  of aortic valve stenosis.   Pulmonic Valve: The pulmonic valve was grossly normal. Pulmonic valve  regurgitation is trivial.   Aorta: The aortic  root, ascending aorta and aortic arch are all  structurally normal, with no evidence of dilitation or obstruction.   Pulmonary Artery: The pulmonary artery is not well seen.   Venous: The inferior vena cava  is normal in size with greater than 50%  respiratory variability, suggesting right atrial pressure of 3 mmHg.   IAS/Shunts: No atrial level shunt detected by color flow Doppler.      ASSESSMENT:    1. Coronary artery disease involving native coronary artery of native heart without angina pectoris   2. Ischemic cardiomyopathy   3. Hyperlipidemia, unspecified hyperlipidemia type   4. Type 1 diabetes mellitus with complications (HCC)   5. Hypotension due to drugs      PLAN:  In order of problems listed above:   1.  ASCAD -s/p NSTEMI  status post PTCA DES x3 in the LAD(ostial DES placed, mid DES placed, distal DES placed) and  PTCA/DES x 1 intermediate branch. -s/p CABG 11/2018 -continue ASA 81mg  daily, Plavix 75mg  daily, high dose statin-discussed with Dr. Radford Pax who recommends she stay on Plavix and ASA long term. -Recent hospitalization with chest pain that was atypical dominated by GI complaints.  No further work-up done   2.  Ischemic DCM -EF normalized at 55-60% by echo 11/2018-no ACE inhibitor ARB or beta-blocker due to orthostatic hypotension   3.  Hyperlipidemia -LDL goal < 70 -on Praluent followed by lipid clinic   4.  Type 1 diabetes on insulin pump multiple admissions for DKA  5. Hypotension-stop Imdur and stay hydrated. Patient to monitor BP and Pulse at home.    Medication Adjustments/Labs and Tests Ordered: Current medicines are reviewed at length with the patient today.  Concerns regarding medicines are outlined above.  Medication changes, Labs and Tests ordered today are listed in the Patient Instructions below. Patient Instructions  Medication Instructions:  Your physician has recommended you make the following change in your medication:   STOP taking Isosorbide  *If you need a refill on your cardiac medications before your next appointment, please call your pharmacy*   Lab Work: None  If you have labs (blood work) drawn today and your tests are  completely normal, you will receive your results only by: Marland Kitchen MyChart Message (if you have MyChart) OR . A paper copy in the mail If you have any lab test that is abnormal or we need to change your treatment, we will call you to review the results.   Testing/Procedures: None   Follow-Up: At [facility], you and your health needs are our priority.  As part of our continuing mission to provide you with exceptional heart care, we have created designated Provider Care Teams.  These Care Teams include your primary Cardiologist (physician) and Advanced Practice Providers (APPs -  Physician Assistants and Nurse Practitioners) who all work together to provide you with the care you need, when you need it.  We recommend signing up for the patient portal called "MyChart".  Sign up information is provided on this After Visit Summary.  MyChart is used to connect with patients for Virtual Visits (Telemedicine).  Patients are able to view lab/test results, encounter notes, upcoming appointments, etc.  Non-urgent messages can be sent to your provider as well.   To learn more about what you can do with MyChart, go to [email or web address].    Your next appointment:   6 month(s)  The format for your next appointment:   In Person  Provider:   Fransico Him, MD   Other  Instructions None     Signed, Ermalinda Barrios, PA-C  08/18/2019 10:09 AM    [facility] [address], [place], [place]  [address] Phone: [phone]; Fax: [phone]

## 2019-08-15 NOTE — BH Specialist Note (Signed)
Integrated Behavioral Health Visit via Telemedicine (Telephone)  08/06/19 Jennifer Hogan [id number]   Session Start time: 3:05 PM  Session End time: 3:50 PM Total time: 35   Referring Provider: NP Raul Del Type of Visit: Telephonic Patient location: Home [facility] Provider location: Office All persons participating in visit: LCSW and Patient  Confirmed patient's address: Yes  Confirmed patient's phone number: Yes  Any changes to demographics: No   Confirmed patient's insurance: Yes  Any changes to patient's insurance: No   Discussed confidentiality: Yes    The following statements were read to the patient and/or legal guardian that are established with the [facility] Provider.  "The purpose of this phone visit is to provide behavioral health care while limiting exposure to the coronavirus (COVID19).  There is a possibility of technology failure and discussed alternative modes of communication if that failure occurs."  "By engaging in this telephone visit, you consent to the provision of healthcare.  Additionally, you authorize for your insurance to be billed for the services provided during this telephone visit."   Patient and/or legal guardian consented to telephone visit: Yes   PRESENTING CONCERNS: Patient and/or family reports the following symptoms/concerns: Pt reports recent denial of SSDI. Despite disappointing news, pt shared that she is feeling physically better and has obtained approval for financial assistance to assist with insulin pump supplies through the [employer] program Duration of problem: Ongoing; Severity of problem: moderate  STRENGTHS (Protective Factors/Coping Skills): Pt has good insight Pt participates in medication management  GOALS ADDRESSED: Patient will: 1.  Reduce symptoms of: anxiety, depression and stress  2.  Increase knowledge and/or ability of: self-management skills  3.  Demonstrate ability to: Increase healthy adjustment to current life  circumstances and Increase adequate support systems for patient/family  INTERVENTIONS: Interventions utilized:  Supportive Counseling Standardized Assessments completed: Not Needed  ASSESSMENT: Patient currently experiencing decrease in anxiety and depression symptoms. Pt was recently denied for SSDI; however, has obtained a lawyer to assist with the appeal.    Patient may benefit from continued medication management and psychotherapy. Pt successfully identified how sobriety has positively influenced changed behavior. She plans to volunteer at the [employer] to promote socialization with others.   PLAN: 1. Follow up with behavioral health clinician on : 08/20/2019 2. Behavioral recommendations: Continue medication management and utilization of strategies discussed 3. Referral(s): [facility])  Rebekah Chesterfield, [place] 08/18/2019 6:41 AM

## 2019-08-18 ENCOUNTER — Encounter: Payer: Self-pay | Admitting: Physician Assistant

## 2019-08-18 ENCOUNTER — Other Ambulatory Visit: Payer: Self-pay

## 2019-08-18 ENCOUNTER — Ambulatory Visit (INDEPENDENT_AMBULATORY_CARE_PROVIDER_SITE_OTHER): Payer: Self-pay | Admitting: Physician Assistant

## 2019-08-18 VITALS — BP 102/58 | HR 101 | Ht 65.0 in | Wt 149.6 lb

## 2019-08-18 DIAGNOSIS — I952 Hypotension due to drugs: Secondary | ICD-10-CM

## 2019-08-18 DIAGNOSIS — E785 Hyperlipidemia, unspecified: Secondary | ICD-10-CM

## 2019-08-18 DIAGNOSIS — I255 Ischemic cardiomyopathy: Secondary | ICD-10-CM

## 2019-08-18 DIAGNOSIS — E108 Type 1 diabetes mellitus with unspecified complications: Secondary | ICD-10-CM

## 2019-08-18 DIAGNOSIS — I2510 Atherosclerotic heart disease of native coronary artery without angina pectoris: Secondary | ICD-10-CM

## 2019-08-18 NOTE — Patient Instructions (Signed)
Medication Instructions:  Your physician has recommended you make the following change in your medication:   STOP taking Isosorbide  *If you need a refill on your cardiac medications before your next appointment, please call your pharmacy*   Lab Work: None  If you have labs (blood work) drawn today and your tests are completely normal, you will receive your results only by: Marland Kitchen MyChart Message (if you have MyChart) OR . A paper copy in the mail If you have any lab test that is abnormal or we need to change your treatment, we will call you to review the results.   Testing/Procedures: None   Follow-Up: At [facility], you and your health needs are our priority.  As part of our continuing mission to provide you with exceptional heart care, we have created designated Provider Care Teams.  These Care Teams include your primary Cardiologist (physician) and Advanced Practice Providers (APPs -  Physician Assistants and Nurse Practitioners) who all work together to provide you with the care you need, when you need it.  We recommend signing up for the patient portal called "MyChart".  Sign up information is provided on this After Visit Summary.  MyChart is used to connect with patients for Virtual Visits (Telemedicine).  Patients are able to view lab/test results, encounter notes, upcoming appointments, etc.  Non-urgent messages can be sent to your provider as well.   To learn more about what you can do with MyChart, go to [email or web address].    Your next appointment:   6 month(s)  The format for your next appointment:   In Person  Provider:   Fransico Him, MD   Other Instructions None

## 2019-08-20 ENCOUNTER — Telehealth: Payer: Self-pay | Admitting: Licensed Clinical Social Worker

## 2019-08-20 ENCOUNTER — Ambulatory Visit: Payer: Self-pay | Attending: Nurse Practitioner | Admitting: Licensed Clinical Social Worker

## 2019-08-20 ENCOUNTER — Other Ambulatory Visit: Payer: Self-pay

## 2019-08-20 NOTE — Telephone Encounter (Signed)
Call placed to patient regarding scheduled IBH appointment. LCSW left message requesting a return call.  

## 2019-08-25 ENCOUNTER — Other Ambulatory Visit: Payer: Self-pay | Admitting: Endocrinology

## 2019-08-25 ENCOUNTER — Other Ambulatory Visit: Payer: Self-pay | Admitting: Cardiology

## 2019-08-25 MED FILL — ?LEVOTHYROXINE SODIUM 100MC: 100 | 30 days supply | Qty: 30 | Fill #2

## 2019-08-25 MED FILL — MONTELUKAST SOD 10 MG TAB: 10 | 30 days supply | Qty: 30 | Fill #4

## 2019-08-25 MED FILL — CLOPIDOGREL 75 MG TABLET: 75 | 30 days supply | Qty: 30 | Fill #0

## 2019-08-26 ENCOUNTER — Other Ambulatory Visit: Payer: Self-pay

## 2019-08-26 DIAGNOSIS — E119 Type 2 diabetes mellitus without complications: Secondary | ICD-10-CM

## 2019-08-26 MED ORDER — INSULIN LISPRO 100 UNIT/ML ~~LOC~~ SOLN: SUBCUTANEOUS | 2 refills | Status: DC

## 2019-08-27 MED FILL — ?HUMALOG 100 UNITS/ML VIAL: 100 | 28 days supply | Qty: 10 | Fill #0

## 2019-09-02 ENCOUNTER — Ambulatory Visit: Payer: Self-pay | Admitting: Physician Assistant

## 2019-09-15 ENCOUNTER — Encounter: Payer: Self-pay | Admitting: Endocrinology

## 2019-09-15 ENCOUNTER — Ambulatory Visit (INDEPENDENT_AMBULATORY_CARE_PROVIDER_SITE_OTHER): Payer: Self-pay | Admitting: Endocrinology

## 2019-09-15 ENCOUNTER — Other Ambulatory Visit: Payer: Self-pay

## 2019-09-15 VITALS — BP 100/60 | HR 113 | Ht 65.0 in | Wt 153.0 lb

## 2019-09-15 DIAGNOSIS — E1059 Type 1 diabetes mellitus with other circulatory complications: Secondary | ICD-10-CM

## 2019-09-15 DIAGNOSIS — E119 Type 2 diabetes mellitus without complications: Secondary | ICD-10-CM

## 2019-09-15 LAB — POCT GLYCOSYLATED HEMOGLOBIN (HGB A1C): Hemoglobin A1C: 7.8 % — AB (ref 4.0–5.6)

## 2019-09-15 NOTE — Progress Notes (Signed)
Subjective:    [name], female    DOB: 01/02/1971, 49 y.o.   MRN: [id number]  HPI Pt returns for f/u of diabetes mellitus:  DM type: 1  Dx'ed: 9937 Complications: CAD.   Therapy: insulin since dx.  DKA: twice (2020 and 2021) Severe hypoglycemia: never.  Pancreatitis: never Other: she has been on pump (medtronic 670) rx since 2014; rx is limited by lack of health insurance.  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   Interval history: She now takes:   basal rate of 0.7 units/hr, 6 AM-10 AM, 0.9 units/hr, 10 AM-10 PM, and 0.625 units/hr overnight.   bolus of 1 unit/17 grams CHO.  correction bolus (which some people call "sensitivity," or "insulin sensitivity ratio," or just "isr") of 1 unit for each 60 by which your glucose exceeds 100.   TDD is 22 units per day (approx 17% bolus).   I reviewed continuous glucose monitor data.  Glucose varies from 60-310.  It is in general higher PC than AC, but there is little trend throughout the day.  She takes 3 mealtime boluses per day.   Past Medical History:  Diagnosis Date  . Anemia   . Anxiety   . Coronary artery disease    a. s/pPTCA DES x3 in the LAD (ostial DES placed, mid DES placed, distal DES placed)and PTCA/DES x1to intermediate branch 02/2017. b. ACS 02/08/18 with LCx stent placement.  . Depression   . Diabetes mellitus    type 1, on insulin pump  . DKA (diabetic ketoacidoses) ([place]) 12/31/2017  . Elevated platelet count   . High cholesterol   . Hypothyroidism   . Ischemic cardiomyopathy    a. EF low-normal with basal inferior akinesis, basal septal hypokinesis by echo 01/2018.    Past Surgical History:  Procedure Laterality Date  . APPENDECTOMY    . CORONARY ARTERY BYPASS GRAFT N/A 11/04/2018   Procedure: CORONARY ARTERY BYPASS GRAFTING (CABG), ON PUMP, TIMES THREE, USING LEFT AND RIGHT INTERNAL MAMMARY ARTERIES;  Surgeon: Wonda Olds, MD;   Location: [facility];  Service: Open Heart Surgery;  Laterality: N/A;  . CORONARY STENT INTERVENTION N/A 03/20/2017   Procedure: DES x 3 LAD, DES OM; Surgeon: Burnell Blanks, MD;  Location: [facility];  Service: Cardiovascular;  Laterality: N/A;  . CORONARY/GRAFT ACUTE MI REVASCULARIZATION N/A 02/08/2018   Procedure: Coronary/Graft Acute MI Revascularization;  Surgeon: Belva Crome, MD;  Location: [facility];  Service: Cardiovascular;  Laterality: N/A;  . LEFT HEART CATH AND CORONARY ANGIOGRAPHY N/A 03/20/2017   Procedure: LEFT HEART CATH AND CORONARY ANGIOGRAPHY;  Surgeon: Burnell Blanks, MD;  Location: [facility];  Service: Cardiovascular;  Laterality: N/A;  . LEFT HEART CATH AND CORONARY ANGIOGRAPHY N/A 02/08/2018   Procedure: LEFT HEART CATH AND CORONARY ANGIOGRAPHY;  Surgeon: Belva Crome, MD;  Location: [facility];  Service: Cardiovascular;  Laterality: N/A;  . LEFT HEART CATH AND CORONARY ANGIOGRAPHY N/A 10/29/2018   Procedure: LEFT HEART CATH AND CORONARY ANGIOGRAPHY;  Surgeon: Burnell Blanks, MD;  Location: [facility];  Service: Cardiovascular;  Laterality: N/A;  . TEE WITHOUT CARDIOVERSION N/A 11/04/2018   Procedure: TRANSESOPHAGEAL ECHOCARDIOGRAM (TEE);  Surgeon: Wonda Olds, MD;  Location: [facility];  Service: Open Heart Surgery;  Laterality: N/A;    Social History   Socioeconomic History  . Marital status:  Divorced    Spouse name: Not on file  . Number of children: Not on file  . Years of education: Not on file  . Highest education level: Not on file  Occupational History  . 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 Smoking status: Never Smoker  . Smokeless tobacco: Never Used  . Tobacco comment: Use marijuana  Vaping Use  . Vaping Use: Never used  Substance and Sexual Activity  . Alcohol use: No    Alcohol/week: 0.0 standard drinks  . Drug use: Not Currently    Types: Marijuana    Comment:  last use last week, occaisonal marijuana use  . Sexual activity: Not Currently    Birth control/protection: None  Other Topics Concern  . Not on file  Social History Narrative  . Not on file   Social Determinants of Health   Financial Resource Strain:   . Difficulty of Paying Living Expenses: Not on file  Food Insecurity:   . Worried About Charity fundraiser in the Last Year: Not on file  . Ran Out of Food in the Last Year: Not on file  Transportation Needs:   . Lack of Transportation (Medical): Not on file  . Lack of Transportation (Non-Medical): Not on file  Physical Activity:   . Days of Exercise per Week: Not on file  . Minutes of Exercise per Session: Not on file  Stress:   . Feeling of Stress : Not on file  Social Connections:   . Frequency of Communication with Friends and Family: Not on file  . Frequency of Social Gatherings with Friends and Family: Not on file  . Attends Religious Services: Not on file  . Active Member of Clubs or Organizations: Not on file  . Attends Archivist Meetings: Not on file  . Marital Status: Not on file  Intimate Partner Violence:   . Fear of Current or Ex-Partner: Not on file  . Emotionally Abused: Not on file  . Physically Abused: Not on file  . Sexually Abused: Not on file    Current Outpatient Medications on File Prior to Visit  Medication Sig Dispense Refill  . acetaminophen (TYLENOL) 325 MG tablet Take 650 mg by mouth daily as needed (couch/pain/fever).    . Alirocumab (PRALUENT) 75 MG/ML SOAJ Inject 1 pen into the skin every 14 (fourteen) days.    Marland Kitchen aspirin 81 MG EC tablet Take 1 tablet (81 mg total) by mouth daily. 90 tablet 0  . b complex vitamins tablet Take 1 tablet by mouth daily.    . benzonatate (TESSALON) 200 MG capsule Take 1 capsule (200 mg total) by mouth 2 (two) times daily as needed for cough. 60 capsule 6  . clopidogrel (PLAVIX) 75 MG tablet TAKE 1 TABLET BY MOUTH DAILY WITH BREAKFAST. 30 tablet 10  .  diclofenac Sodium (VOLTAREN) 1 % GEL Apply 2 g topically 4 (four) times daily. (Patient taking differently: Apply 2 g topically daily as needed (For pain). ) 200 g 3  . ferrous sulfate 325 (65 FE) MG tablet Take 325 mg by mouth See admin instructions. Take one tablet (325 mg) by mouth once or twice during menstrual cycle    . FLUoxetine (PROZAC) 40 MG capsule Take 1 capsule (40 mg total) by mouth daily. 90 capsule 3  . fluticasone (FLONASE) 50 MCG/ACT nasal spray Place 1 spray into both nostrils at bedtime.    . hydrOXYzine (ATARAX/VISTARIL) 25 MG tablet Take 1 tablet (  25 mg total) by mouth 3 (three) times daily as needed. (Patient taking differently: Take 25 mg by mouth 3 (three) times daily as needed for anxiety. ) 60 tablet 1  . insulin aspart (NOVOLOG) 100 UNIT/ML injection 35 units per insulin pump daily; MUST KEEP APPT FOR FUTURE REFILLS TO BE AUTHORIZED 10 mL 0  . Insulin Human (INSULIN PUMP) SOLN Inject into the skin continuous. Humalog    . insulin lispro (HUMALOG) 100 UNIT/ML injection Inject 35 units daily via pump 10 mL 2  . ipratropium (ATROVENT) 0.06 % nasal spray Place 2 sprays into both nostrils 2 (two) times daily as needed for rhinitis.    Marland Kitchen levothyroxine (EUTHYROX) 100 MCG tablet Take 1 tablet (100 mcg total) by mouth daily before breakfast. 30 tablet 3  . LORazepam (ATIVAN) 0.5 MG tablet Take 1 tablet (0.5 mg total) by mouth every 8 (eight) hours as needed for anxiety. 10 tablet 0  . metoCLOPramide (REGLAN) 5 MG tablet Take 1 tablet (5 mg total) by mouth every 12 (twelve) hours as needed for nausea or vomiting. 90 tablet 1  . nitroGLYCERIN (NITROSTAT) 0.4 MG SL tablet Place 1 tablet (0.4 mg total) under the tongue every 5 (five) minutes as needed for chest pain. 30 tablet 0  . ondansetron (ZOFRAN ODT) 4 MG disintegrating tablet Take 1 tablet (4 mg total) by mouth every 8 (eight) hours as needed for nausea or vomiting. 20 tablet 1  . Phenyleph-Doxyl-DM-Aspirin (ALKA-SELTZER PLUS COLD  DY/NGHT PO) Take 1 tablet by mouth at bedtime as needed (cough).    Marland Kitchen albuterol (VENTOLIN HFA) 108 (90 Base) MCG/ACT inhaler INHALE 2 PUFFS INTO THE LUNGS EVERY 6 (SIX) HOURS AS NEEDED FOR WHEEZING OR SHORTNESS OF BREATH. 8.5 g 1  . montelukast (SINGULAIR) 10 MG tablet Take 1 tablet (10 mg total) by mouth at bedtime. 90 tablet 2  . pantoprazole (PROTONIX) 40 MG tablet Take 1 tablet (40 mg total) by mouth 2 (two) times daily as needed (acid reflux). (Patient taking differently: Take 40 mg by mouth as needed. Pt taking as needed for indigestion/acid reflux.) 60 tablet 3   No current facility-administered medications on file prior to visit.    Allergies  Allergen Reactions  . Atorvastatin Other (See Comments)    Myalgias on 80mg  dose, tolerates 20mg  ok  . Crestor [Rosuvastatin Calcium] Other (See Comments)    Severe myalgias and joint pain. Has tolerated atorvastatin though  . Monosodium Glutamate Nausea And Vomiting and Other (See Comments)    migraine  . Peanut-Containing Drug Products Other (See Comments)    Vomiting, upset stomach and some wheezing  . Zetia [Ezetimibe] Other (See Comments)    myalgias  . Ciprofloxin Hcl [Ciprofloxacin] Nausea And Vomiting and Other (See Comments)    Severe migraine  . Food Color Red [Red Dye] Diarrhea    Family History  Problem Relation Age of Onset  . Coronary artery disease Father   . Congestive Heart Failure Father   . Asthma Father   . Cancer Mother        T cell lymphoma    BP 100/60   Pulse (!) 113   Ht 5\' 5"  (1.651 m)   Wt 153 lb (69.4 kg)   SpO2 98%   BMI 25.46 kg/m    Review of Systems Denies LOC.      Objective:   Physical Exam VITAL SIGNS:  See vs page GENERAL: no distress Pulses: dorsalis pedis intact bilat.   MSK: no deformity of the  feet.   CV: no leg edema.   Skin:  no ulcer on the feet.  normal color and temp on the feet. Neuro: sensation is intact to touch on the feet.    Lab Results  Component Value Date    HGBA1C 7.8 (A) 09/15/2019       Assessment & Plan:  Type 1 DM: Based on the pattern of her cbg's, she needs some adjustment in her therapy Hypoglycemia, due to insulin: this limits aggressiveness of glycemic control   Patient Instructions  Please continue these pump settings: basal rate of 0.7 units/hr, 6 AM-10 AM, 0.9 units/hr, 10 AM-10 PM, and 0.625 units/hr overnight (when not in "auto mode").  bolus of 1 unit/16 grams CHO. correction bolus (which some people call "sensitivity," or "insulin sensitivity ratio," or just "isr") of 1 unit for each 60 by which your glucose exceeds 100.   Please come back for a follow-up appointment in 2 months.

## 2019-09-15 NOTE — Patient Instructions (Addendum)
Please continue these pump settings: basal rate of 0.7 units/hr, 6 AM-10 AM, 0.9 units/hr, 10 AM-10 PM, and 0.625 units/hr overnight (when not in "auto mode").  bolus of 1 unit/16 grams CHO. correction bolus (which some people call "sensitivity," or "insulin sensitivity ratio," or just "isr") of 1 unit for each 60 by which your glucose exceeds 100.   Please come back for a follow-up appointment in 2 months.

## 2019-10-08 ENCOUNTER — Other Ambulatory Visit: Payer: Self-pay | Admitting: Nurse Practitioner

## 2019-10-08 ENCOUNTER — Other Ambulatory Visit: Payer: Self-pay | Admitting: *Deleted

## 2019-10-08 DIAGNOSIS — J9801 Acute bronchospasm: Secondary | ICD-10-CM

## 2019-10-08 MED FILL — MONTELUKAST SOD 10 MG TAB: 10 | 30 days supply | Qty: 30 | Fill #5

## 2019-10-08 MED FILL — ALBUTEROL SULFATE HFA 108 (: 108 (90 BAS | 25 days supply | Qty: 9 | Fill #0

## 2019-10-08 MED FILL — ?LEVOTHYROXINE SODIUM 100MC: 100 | 30 days supply | Qty: 30 | Fill #3

## 2019-10-08 MED FILL — FLUoxetine HCL 40 MG CAPS: 40 | 30 days supply | Qty: 30 | Fill #0

## 2019-10-08 MED FILL — ?CLOPIDOGREL 75MG TA: 75 | 30 days supply | Qty: 30 | Fill #1

## 2019-10-08 MED FILL — ?HUMALOG 100 UNITS/ML VIAL: 100 | 28 days supply | Qty: 10 | Fill #1

## 2019-10-08 NOTE — Telephone Encounter (Signed)
Change in [place] of Rx sent to new pharmacy.

## 2019-11-13 ENCOUNTER — Telehealth: Payer: Self-pay

## 2019-11-13 DIAGNOSIS — E7800 Pure hypercholesterolemia, unspecified: Secondary | ICD-10-CM

## 2019-11-13 NOTE — Telephone Encounter (Signed)
lmom for lipid labs  

## 2019-11-13 NOTE — Telephone Encounter (Signed)
-----   Message from Ramond Dial, [place] sent at 11/13/2019  1:48 PM EDT -----  ----- Message ----- From: Ramond Dial, RPH-CPP Sent: 11/13/2019 To: Ramond Dial, RPH-CPP  Set up lipids ----- Message ----- From: Ramond Dial, RPH-CPP Sent: 09/02/2019 To: Ramond Dial, RPH-CPP  Did pt get labs at apt w/ michele or do we need to schedule

## 2019-11-14 MED FILL — FLUoxetine HCL 40 MG CAPS: 40 | 30 days supply | Qty: 30 | Fill #1

## 2019-11-14 MED FILL — MONTELUKAST SOD 10 MG TAB: 10 | 30 days supply | Qty: 30 | Fill #6

## 2019-11-14 MED FILL — ?CLOPIDOGREL 75MG TA: 75 | 30 days supply | Qty: 30 | Fill #2

## 2019-11-14 MED FILL — ALBUTEROL SULFATE HFA 108 (: 108 (90 BAS | 25 days supply | Qty: 9 | Fill #1

## 2019-11-14 MED FILL — ?HUMALOG 100 UNITS/ML VIAL: 100 | 28 days supply | Qty: 10 | Fill #2

## 2019-11-14 MED FILL — LEVOTHYROXINE SODIUM 100 MC: 100 | 30 days supply | Qty: 30 | Fill #0

## 2019-11-17 ENCOUNTER — Encounter: Payer: Self-pay | Admitting: Endocrinology

## 2019-11-17 ENCOUNTER — Other Ambulatory Visit: Payer: Self-pay | Admitting: Endocrinology

## 2019-11-17 ENCOUNTER — Other Ambulatory Visit: Payer: Self-pay

## 2019-11-17 ENCOUNTER — Ambulatory Visit (INDEPENDENT_AMBULATORY_CARE_PROVIDER_SITE_OTHER): Payer: Medicaid Other | Admitting: Endocrinology

## 2019-11-17 VITALS — BP 110/70 | HR 85 | Ht 65.0 in | Wt 156.8 lb

## 2019-11-17 DIAGNOSIS — E1059 Type 1 diabetes mellitus with other circulatory complications: Secondary | ICD-10-CM

## 2019-11-17 DIAGNOSIS — E039 Hypothyroidism, unspecified: Secondary | ICD-10-CM

## 2019-11-17 DIAGNOSIS — E119 Type 2 diabetes mellitus without complications: Secondary | ICD-10-CM

## 2019-11-17 LAB — POCT GLYCOSYLATED HEMOGLOBIN (HGB A1C): Hemoglobin A1C: 8.6 % — AB (ref 4.0–5.6)

## 2019-11-17 MED ORDER — INSULIN ASPART 100 UNIT/ML ~~LOC~~ SOLN: SUBCUTANEOUS | 3 refills | Status: DC

## 2019-11-17 MED ORDER — LEVOTHYROXINE SODIUM 100 MCG PO TABS: 100.0000 ug | ORAL_TABLET | Freq: Every day | ORAL | 3 refills | Status: DC

## 2019-11-17 NOTE — Patient Instructions (Addendum)
Please continue these pump settings: basal rate of 0.7 units/hr, 6 AM-10 AM, 0.9 units/hr, 10 AM-10 PM, and 0.625 units/hr overnight (when not in "auto mode").  bolus of 1 unit/16 grams CHO. correction bolus (which some people call "sensitivity," or "insulin sensitivity ratio," or just "isr") of 1 unit for each 60 by which your glucose exceeds 100.   Try reducing the upper range for your auto bolus 10-20 points.   Please come back for a follow-up appointment in 3 months.

## 2019-11-17 NOTE — Progress Notes (Signed)
Subjective:    [name], female    DOB: [birth year], 49 y.o.   MRN: [id number]  HPI Pt returns for f/u of diabetes mellitus:  DM type: 1  Dx'ed: 8341 Complications: CAD.   Therapy: insulin since dx.  DKA: twice (2020 and 2021) Severe hypoglycemia: never.  Pancreatitis: never Other: she has been on pump (medtronic 670) rx since 2014; rx is limited by lack of health insurance.  SDOH:  She did not see GI, due to no ins at the time; she has medicaid; she teaches part time.   Interval history: She now takes:  basal rate of 0.7 units/hr, 6 AM-10 AM, 0.9 units/hr, 10 AM-10 PM, and 0.625 units/hr overnight (when not in "auto mode").   bolus of 1 unit/16 grams CHO.   correction bolus (which some people call "sensitivity," or "insulin sensitivity ratio," or just "isr") of 1 unit for each 60 by which your glucose exceeds 100.   I reviewed continuous glucose monitor data.  glucose varies from 40-310.  There is no trend throughout the day.  She is in auto mode 33% of the time.  She takes 3.3 meals/d. TDD is 25 units (63% basal).  She has mild hypoglycemia approx 2-3 times per month.  She says glycemic control is worse, due to starting a new job.   Past Medical History:  Diagnosis Date  . Anemia   . Anxiety   . Coronary artery disease    a. s/pPTCA DES x3 in the LAD (ostial DES placed, mid DES placed, distal DES placed)and PTCA/DES x1to intermediate branch 02/2017. b. ACS 02/08/18 with LCx stent placement.  . Depression   . Diabetes mellitus    type 1, on insulin pump  . DKA (diabetic ketoacidoses) 12/31/2017  . Elevated platelet count   . High cholesterol   . Hypothyroidism   . Ischemic cardiomyopathy    a. EF low-normal with basal inferior akinesis, basal septal hypokinesis by echo 01/2018.    Past Surgical History:  Procedure Laterality Date  . APPENDECTOMY    . CORONARY ARTERY BYPASS GRAFT N/A 11/04/2018   Procedure: CORONARY ARTERY BYPASS GRAFTING (CABG), ON  PUMP, TIMES THREE, USING LEFT AND RIGHT INTERNAL MAMMARY ARTERIES;  Surgeon: Wonda Olds, MD;  Location: [facility];  Service: Open Heart Surgery;  Laterality: N/A;  . CORONARY STENT INTERVENTION N/A 03/20/2017   Procedure: DES x 3 LAD, DES OM; Surgeon: Burnell Blanks, MD;  Location: [facility];  Service: Cardiovascular;  Laterality: N/A;  . CORONARY/GRAFT ACUTE MI REVASCULARIZATION N/A 02/08/2018   Procedure: Coronary/Graft Acute MI Revascularization;  Surgeon: Belva Crome, MD;  Location: [facility];  Service: Cardiovascular;  Laterality: N/A;  . LEFT HEART CATH AND CORONARY ANGIOGRAPHY N/A 03/20/2017   Procedure: LEFT HEART CATH AND CORONARY ANGIOGRAPHY;  Surgeon: Burnell Blanks, MD;  Location: [facility];  Service: Cardiovascular;  Laterality: N/A;  . LEFT HEART CATH AND CORONARY ANGIOGRAPHY N/A 02/08/2018   Procedure: LEFT HEART CATH AND CORONARY ANGIOGRAPHY;  Surgeon: Belva Crome, MD;  Location: [facility];  Service: Cardiovascular;  Laterality: N/A;  . LEFT HEART CATH AND CORONARY ANGIOGRAPHY N/A 10/29/2018   Procedure: LEFT HEART CATH AND CORONARY ANGIOGRAPHY;  Surgeon: Burnell Blanks, MD;  Location: [facility];  Service: Cardiovascular;  Laterality: N/A;  . TEE WITHOUT CARDIOVERSION N/A 11/04/2018   Procedure: TRANSESOPHAGEAL ECHOCARDIOGRAM (TEE);  Surgeon: Wonda Olds, MD;  Location: [facility];  Service: Open Heart Surgery;  Laterality: N/A;    Social History   Socioeconomic History  . Marital status: Divorced    Spouse name: Not on file  . Number of children: Not on file  . Years of education: Not on file  . Highest education level: Not on file  Occupational History  . 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 Smoking status: Never Smoker  . Smokeless tobacco: Never Used  . Tobacco comment: Use marijuana  Vaping Use  . Vaping Use: Never used  Substance and Sexual Activity  . Alcohol use: No     Alcohol/week: 0.0 standard drinks  . Drug use: Not Currently    Types: Marijuana    Comment: last use last week, occaisonal marijuana use  . Sexual activity: Not Currently    Birth control/protection: None  Other Topics Concern  . Not on file  Social History Narrative  . Not on file   Social Determinants of Health   Financial Resource Strain:   . Difficulty of Paying Living Expenses: Not on file  Food Insecurity:   . Worried About Charity fundraiser in the Last Year: Not on file  . Ran Out of Food in the Last Year: Not on file  Transportation Needs:   . Lack of Transportation (Medical): Not on file  . Lack of Transportation (Non-Medical): Not on file  Physical Activity:   . Days of Exercise per Week: Not on file  . Minutes of Exercise per Session: Not on file  Stress:   . Feeling of Stress : Not on file  Social Connections:   . Frequency of Communication with Friends and Family: Not on file  . Frequency of Social Gatherings with Friends and Family: Not on file  . Attends Religious Services: Not on file  . Active Member of Clubs or Organizations: Not on file  . Attends Archivist Meetings: Not on file  . Marital Status: Not on file  Intimate Partner Violence:   . Fear of Current or Ex-Partner: Not on file  . Emotionally Abused: Not on file  . Physically Abused: Not on file  . Sexually Abused: Not on file    Current Outpatient Medications on File Prior to Visit  Medication Sig Dispense Refill  . acetaminophen (TYLENOL) 325 MG tablet Take 650 mg by mouth daily as needed (couch/pain/fever).    . Alirocumab (PRALUENT) 75 MG/ML SOAJ Inject 1 pen into the skin every 14 (fourteen) days.    Marland Kitchen aspirin 81 MG EC tablet Take 1 tablet (81 mg total) by mouth daily. 90 tablet 0  . b complex vitamins tablet Take 1 tablet by mouth daily.    . benzonatate (TESSALON) 200 MG capsule Take 1 capsule (200 mg total) by mouth 2 (two) times daily as needed for cough. 60 capsule 6   . clopidogrel (PLAVIX) 75 MG tablet TAKE 1 TABLET BY MOUTH DAILY WITH BREAKFAST. 30 tablet 10  . diclofenac Sodium (VOLTAREN) 1 % GEL Apply 2 g topically 4 (four) times daily. (Patient taking differently: Apply 2 g topically daily as needed (For pain). ) 200 g 3  . ferrous sulfate 325 (65 FE) MG tablet Take 325 mg by mouth See admin instructions. Take one tablet (325 mg) by mouth once or twice during menstrual cycle    . FLUoxetine (PROZAC) 40 MG capsule TAKE 1 CAPSULE BY MOUTH DAILY. 90 capsule 1  . fluticasone (FLONASE) 50 MCG/ACT nasal spray Place 1 spray  into both nostrils at bedtime.    . hydrOXYzine (ATARAX/VISTARIL) 25 MG tablet Take 1 tablet (25 mg total) by mouth 3 (three) times daily as needed. (Patient taking differently: Take 25 mg by mouth 3 (three) times daily as needed for anxiety. ) 60 tablet 1  . Insulin Human (INSULIN PUMP) SOLN Inject into the skin continuous. Humalog    . insulin lispro (HUMALOG) 100 UNIT/ML injection Inject 35 units daily via pump 10 mL 2  . ipratropium (ATROVENT) 0.06 % nasal spray Place 2 sprays into both nostrils 2 (two) times daily as needed for rhinitis.    Marland Kitchen LORazepam (ATIVAN) 0.5 MG tablet Take 1 tablet (0.5 mg total) by mouth every 8 (eight) hours as needed for anxiety. 10 tablet 0  . metoCLOPramide (REGLAN) 5 MG tablet Take 1 tablet (5 mg total) by mouth every 12 (twelve) hours as needed for nausea or vomiting. 90 tablet 1  . nitroGLYCERIN (NITROSTAT) 0.4 MG SL tablet Place 1 tablet (0.4 mg total) under the tongue every 5 (five) minutes as needed for chest pain. 30 tablet 0  . ondansetron (ZOFRAN ODT) 4 MG disintegrating tablet Take 1 tablet (4 mg total) by mouth every 8 (eight) hours as needed for nausea or vomiting. 20 tablet 1  . Phenyleph-Doxyl-DM-Aspirin (ALKA-SELTZER PLUS COLD DY/NGHT PO) Take 1 tablet by mouth at bedtime as needed (cough).    Marland Kitchen albuterol (VENTOLIN HFA) 108 (90 Base) MCG/ACT inhaler INHALE 2 PUFFS INTO THE LUNGS EVERY 6 (SIX) HOURS  AS NEEDED FOR WHEEZING OR SHORTNESS OF BREATH. 8.5 g 1  . montelukast (SINGULAIR) 10 MG tablet Take 1 tablet (10 mg total) by mouth at bedtime. 90 tablet 2  . pantoprazole (PROTONIX) 40 MG tablet Take 1 tablet (40 mg total) by mouth 2 (two) times daily as needed (acid reflux). (Patient taking differently: Take 40 mg by mouth as needed. Pt taking as needed for indigestion/acid reflux.) 60 tablet 3   No current facility-administered medications on file prior to visit.    Allergies  Allergen Reactions  . Atorvastatin Other (See Comments)    Myalgias on 80mg  dose, tolerates 20mg  ok  . Crestor [Rosuvastatin Calcium] Other (See Comments)    Severe myalgias and joint pain. Has tolerated atorvastatin though  . Monosodium Glutamate Nausea And Vomiting and Other (See Comments)    migraine  . Peanut-Containing Drug Products Other (See Comments)    Vomiting, upset stomach and some wheezing  . Zetia [Ezetimibe] Other (See Comments)    myalgias  . Ciprofloxin Hcl [Ciprofloxacin] Nausea And Vomiting and Other (See Comments)    Severe migraine  . Food Color Red [Red Dye] Diarrhea    Family History  Problem Relation Age of Onset  . Coronary artery disease Father   . Congestive Heart Failure Father   . Asthma Father   . Cancer Mother        T cell lymphoma    BP 110/70   Pulse 85   Ht 5\' 5"  (1.651 m)   Wt 156 lb 12.8 oz (71.1 kg)   SpO2 96%   BMI 26.09 kg/m   Review of Systems Denies LOC.      Objective:   Physical Exam VITAL SIGNS:  See vs page GENERAL: no distress Pulses: dorsalis pedis intact bilat.   MSK: no deformity of the feet CV: no leg edema Skin:  no ulcer on the feet.  normal color and temp on the feet.   Neuro: sensation is intact to touch on the  feet.    Lab Results  Component Value Date   HGBA1C 7.8 (A) 09/15/2019   Lab Results  Component Value Date   CREATININE 1.02 (H) 07/14/2019   BUN 8 07/14/2019   NA 138 07/14/2019   K 3.4 (L) 07/14/2019   CL 106  07/14/2019   CO2 23 07/14/2019   Lab Results  Component Value Date   TSH 1.06 01/09/2019       Assessment & Plan:  Type 1 DM: uncontrolled.  Hypoglycemia, due to insulin: this limits aggressiveness of glycemic control.    Patient Instructions  Please continue these pump settings: basal rate of 0.7 units/hr, 6 AM-10 AM, 0.9 units/hr, 10 AM-10 PM, and 0.625 units/hr overnight (when not in "auto mode").  bolus of 1 unit/16 grams CHO. correction bolus (which some people call "sensitivity," or "insulin sensitivity ratio," or just "isr") of 1 unit for each 60 by which your glucose exceeds 100.   Try reducing the upper range for your auto bolus 10-20 points.   Please come back for a follow-up appointment in 3 months.

## 2019-11-21 ENCOUNTER — Telehealth: Payer: Medicaid Other | Admitting: Emergency Medicine

## 2019-11-21 DIAGNOSIS — L0390 Cellulitis, unspecified: Secondary | ICD-10-CM

## 2019-11-21 MED ORDER — CLINDAMYCIN HCL 150 MG PO CAPS
450.0000 mg | ORAL_CAPSULE | Freq: Three times a day (TID) | ORAL | 0 refills | Status: DC
Start: 2019-11-21 — End: 2019-11-22

## 2019-11-21 NOTE — Progress Notes (Signed)
E Visit for Cellulitis  We are sorry that you are not feeling well. Here is how we plan to help!  **Please do not respond to this message unless you have follow up questions.**  Based on what you shared with me it looks like you have cellulitis.  Cellulitis looks like areas of skin redness, swelling, and warmth; it develops as a result of bacteria entering under the skin. Little red spots and/or bleeding can be seen in skin, and tiny surface sacs containing fluid can occur. Fever can be present. Cellulitis is almost always on one side of a body, and the lower limbs are the most common site of involvement.   I have prescribed:  Clindamycin 450 mg take one by mouth three times a day for 5 days  HOME CARE:  . Take your medications as ordered and take all of them, even if the skin irritation appears to be healing.   GET HELP RIGHT AWAY IF:  . Symptoms that don't begin to go away within 48 hours. . Severe redness persists or worsens . If the area turns color, spreads or swells. . If it blisters and opens, develops yellow-brown crust or bleeds. . You develop a fever or chills. . If the pain increases or becomes unbearable.  . Are unable to keep fluids and food down.  MAKE SURE YOU    Understand these instructions.  Will watch your condition.  Will get help right away if you are not doing well or get worse.  Thank you for choosing an e-visit. Your e-visit answers were reviewed by a board certified advanced clinical practitioner to complete your personal care plan. Depending upon the condition, your plan could have included both over the counter or prescription medications. Please review your pharmacy choice. Make sure the pharmacy is open so you can pick up prescription now. If there is a problem, you may contact your provider through [employer] and have the prescription routed to another pharmacy. Your safety is important to [name]. If you have drug allergies check your prescription  carefully.  For the next 24 hours you can use MyChart to ask questions about today's visit, request a non-urgent call back, or ask for a work or school excuse. You will get an email in the next two days asking about your experience. I hope that your e-visit has been valuable and will speed your recovery.   Greater than 5 but less than 10 minutes spent researching, coordinating, and implementing care for this patient today

## 2019-11-22 MED ORDER — CLINDAMYCIN HCL 150 MG PO CAPS
450.0000 mg | ORAL_CAPSULE | Freq: Three times a day (TID) | ORAL | 0 refills | Status: AC
Start: 2019-11-22 — End: 2019-11-27

## 2019-11-22 NOTE — Addendum Note (Signed)
Addended by: Evelina Dun A on: 11/22/2019 05:22 PM   Modules accepted: Orders

## 2019-12-01 DIAGNOSIS — H6012 Cellulitis of left external ear: Secondary | ICD-10-CM | POA: Insufficient documentation

## 2019-12-08 ENCOUNTER — Telehealth: Payer: Self-pay

## 2019-12-08 DIAGNOSIS — E785 Hyperlipidemia, unspecified: Secondary | ICD-10-CM

## 2019-12-08 NOTE — Telephone Encounter (Signed)
lmomed to schedule lipid labs

## 2019-12-08 NOTE — Telephone Encounter (Signed)
-----   Message from Ramond Dial, [place] sent at 12/08/2019  7:34 AM EST ----- Please call again, thanks ----- Message ----- From: Allean Found, CMA Sent: 11/13/2019   2:21 PM EST To: Ramond Dial, RPH-CPP  Called and lmomed and labs ordered ----- Message ----- From: Ramond Dial, RPH-CPP Sent: 11/13/2019   1:48 PM EDT To: Allean Found, CMA   ----- Message ----- From: Ramond Dial, RPH-CPP Sent: 11/13/2019 To: Ramond Dial, RPH-CPP  Set up lipids ----- Message ----- From: Ramond Dial, RPH-CPP Sent: 09/02/2019 To: Ramond Dial, RPH-CPP  Did pt get labs at apt w/ michele or do we need to schedule

## 2019-12-12 DIAGNOSIS — H9553 Postprocedural seroma of ear and mastoid process following a procedure on the ear and mastoid process: Secondary | ICD-10-CM | POA: Insufficient documentation

## 2019-12-15 ENCOUNTER — Ambulatory Visit: Payer: Medicaid Other | Attending: Family

## 2019-12-15 DIAGNOSIS — Z23 Encounter for immunization: Secondary | ICD-10-CM

## 2019-12-17 ENCOUNTER — Other Ambulatory Visit: Payer: Self-pay | Admitting: Nurse Practitioner

## 2019-12-17 DIAGNOSIS — J9801 Acute bronchospasm: Secondary | ICD-10-CM

## 2019-12-17 MED FILL — FLUoxetine HCL 40 MG CAPS: 40 | 30 days supply | Qty: 30 | Fill #2

## 2019-12-17 MED FILL — LEVOTHYROXINE SODIUM 100 MC: 100 | 30 days supply | Qty: 30 | Fill #0

## 2019-12-17 MED FILL — ALBUTEROL SULFATE HFA 108 (: 108 (90 BAS | 25 days supply | Qty: 9 | Fill #0

## 2019-12-17 MED FILL — MONTELUKAST SOD 10 MG TAB: 10 | 30 days supply | Qty: 30 | Fill #7

## 2019-12-17 MED FILL — ?HUMALOG 100 UNITS/ML VIAL: 100 | 28 days supply | Qty: 10 | Fill #0

## 2019-12-17 MED FILL — ?CLOPIDOGREL 75MG TA: 75 | 30 days supply | Qty: 30 | Fill #3

## 2019-12-22 ENCOUNTER — Other Ambulatory Visit: Payer: Self-pay | Admitting: *Deleted

## 2019-12-22 ENCOUNTER — Other Ambulatory Visit: Payer: Self-pay

## 2019-12-22 DIAGNOSIS — E785 Hyperlipidemia, unspecified: Secondary | ICD-10-CM

## 2019-12-22 NOTE — Addendum Note (Signed)
Addended by: Marcelle Overlie D on: 12/22/2019 01:52 PM   Modules accepted: Orders

## 2019-12-23 ENCOUNTER — Telehealth: Payer: Self-pay | Admitting: Pharmacist

## 2019-12-23 LAB — LDL CHOLESTEROL, DIRECT: LDL Direct: 109 mg/dL — ABNORMAL HIGH (ref 0–99)

## 2019-12-23 LAB — HEPATIC FUNCTION PANEL
ALT: 17 IU/L (ref 0–32)
AST: 22 IU/L (ref 0–40)
Albumin: 4.5 g/dL (ref 3.8–4.8)
Alkaline Phosphatase: 80 IU/L (ref 44–121)
Bilirubin Total: 0.3 mg/dL (ref 0.0–1.2)
Bilirubin, Direct: <0.1 mg/dL (ref 0.00–0.40)
Total Protein: 7.1 g/dL (ref 6.0–8.5)

## 2019-12-23 LAB — LIPID PANEL
Chol/HDL Ratio: 3 ratio (ref 0.0–4.4)
Cholesterol, Total: 206 mg/dL — ABNORMAL HIGH (ref 100–199)
HDL: 68 mg/dL (ref >39)
LDL Chol Calc (NIH): 114 mg/dL — ABNORMAL HIGH (ref 0–99)
Triglycerides: 137 mg/dL (ref 0–149)
VLDL Cholesterol Cal: 24 mg/dL (ref 5–40)

## 2019-12-23 NOTE — Telephone Encounter (Signed)
Called pt to discuss lab work. Patient is on praluent 75mg  q 14 days through [facility] program. Does not have insurance. Will need to see why patient stopped her atorvastatin. Can increase dose of Praluent to 150mg   Left VM on machine for patient to call back.

## 2019-12-23 NOTE — Addendum Note (Signed)
Addended by: Marcelle Overlie D on: 12/23/2019 02:03 PM   Modules accepted: Orders

## 2019-12-23 NOTE — Telephone Encounter (Signed)
Patient returned call. She stopped atorvastatin due to increased muscle pains. Will increase praluent to 150mg  q 14 days. I have called [facility] and gave them a verbal. Patients [facility] is good through 08/03/2020

## 2020-01-23 ENCOUNTER — Other Ambulatory Visit: Payer: Self-pay | Admitting: Nurse Practitioner

## 2020-01-23 DIAGNOSIS — J9801 Acute bronchospasm: Secondary | ICD-10-CM

## 2020-01-23 MED FILL — ?CLOPIDOGREL 75MG TA: 75 | 30 days supply | Qty: 30 | Fill #4

## 2020-01-23 MED FILL — MONTELUKAST SOD 10 MG TAB: 10 | 30 days supply | Qty: 30 | Fill #8

## 2020-01-23 MED FILL — LEVOTHYROXINE SODIUM 100 MC: 100 | 30 days supply | Qty: 30 | Fill #1

## 2020-01-23 MED FILL — FLUoxetine HCL 40 MG CAPS: 40 | 30 days supply | Qty: 30 | Fill #3

## 2020-01-23 MED FILL — ?HUMALOG 100 UNITS/ML VIAL: 100 | 28 days supply | Qty: 10 | Fill #1

## 2020-01-26 ENCOUNTER — Other Ambulatory Visit: Payer: Self-pay | Admitting: Nurse Practitioner

## 2020-02-18 ENCOUNTER — Other Ambulatory Visit: Payer: Self-pay

## 2020-02-18 ENCOUNTER — Ambulatory Visit: Payer: Self-pay | Attending: Nurse Practitioner | Admitting: Licensed Clinical Social Worker

## 2020-02-18 DIAGNOSIS — F411 Generalized anxiety disorder: Secondary | ICD-10-CM

## 2020-02-23 ENCOUNTER — Other Ambulatory Visit: Payer: Self-pay

## 2020-02-23 ENCOUNTER — Telehealth (INDEPENDENT_AMBULATORY_CARE_PROVIDER_SITE_OTHER): Payer: Medicaid Other | Admitting: Endocrinology

## 2020-02-23 ENCOUNTER — Encounter: Payer: Self-pay | Admitting: Endocrinology

## 2020-02-23 DIAGNOSIS — E1059 Type 1 diabetes mellitus with other circulatory complications: Secondary | ICD-10-CM

## 2020-02-23 NOTE — Patient Instructions (Addendum)
Please continue these pump settings: basal rate of 0.7 units/hr, 6 AM-10 AM, 0.9 units/hr, 10 AM-10 PM, and 0.625 units/hr overnight (when not in "auto mode").  bolus of 1 unit/16 grams CHO. correction bolus (which some people call "sensitivity," or "insulin sensitivity ratio," or just "isr") of 1 unit for each 100 by which your glucose exceeds 100.   Please come back for a follow-up appointment in 2 months.

## 2020-02-23 NOTE — Progress Notes (Signed)
Subjective:    [name], female    DOB: [birth year], 50 y.o.   MRN: [id number]  HPI  telehealth visit today via video visit.  Alternatives to telehealth are presented to this patient, and the patient agrees to the telehealth visit. Pt is advised of the cost of the visit, and agrees to this, also.   Patient is at home, and I am at the office.   Persons attending the telehealth visit: the patient and I Pt returns for f/u of diabetes mellitus:  DM type: 1  Dx'ed: 5284 Complications: CAD.   Therapy: insulin since dx.  DKA: twice (2020 and 2021) Severe hypoglycemia: never.  Pancreatitis: never Other: she has been on pump (medtronic 670) rx since 2014; rx is limited by lack of health insurance.  SDOH:  She did not see GI, due to no ins at the time; she has medicaid; she teaches part time.   Interval history: She now takes these pump settings: basal rate of 0.7 units/hr, 6 AM-10 AM, 0.9 units/hr, 10 AM-10 PM, and 0.625 units/hr overnight (when not in "auto mode").  bolus of 1 unit/16 grams CHO. correction bolus (which some people call "sensitivity," or "insulin sensitivity ratio," or just "isr") of 1 unit for each 60 by which your glucose exceeds 100.   I reviewed continuous glucose monitor data.  Glucose varies from 40-400, but most are in the 100's.  It is in general higher as the day goes on, but not necessarily so.     TDD is 25 units (35% bolus).  She is in auto mode 79% of the time (wears sensor 82% of the time).  She takes 4.5 boluses per day.   Pt says hypoglycemia happens only after a correction bolus.  Glucose is in general higher as the day goes on.   Past Medical History:  Diagnosis Date   Anemia    Anxiety    Coronary artery disease    a. s/pPTCA DES x3 in the LAD (ostial DES placed, mid DES placed, distal DES placed)and PTCA/DES x1to intermediate branch 02/2017. b. ACS 02/08/18 with LCx stent placement.   Depression    Diabetes mellitus    type  1, on insulin pump   DKA (diabetic ketoacidoses) 12/31/2017   Elevated platelet count    High cholesterol    Hypothyroidism    Ischemic cardiomyopathy    a. EF low-normal with basal inferior akinesis, basal septal hypokinesis by echo 01/2018.    Past Surgical History:  Procedure Laterality Date   APPENDECTOMY     CORONARY ARTERY BYPASS GRAFT N/A 11/04/2018   Procedure: CORONARY ARTERY BYPASS GRAFTING (CABG), ON PUMP, TIMES THREE, USING LEFT AND RIGHT INTERNAL MAMMARY ARTERIES;  Surgeon: Wonda Olds, MD;  Location: [facility];  Service: Open Heart Surgery;  Laterality: N/A;   CORONARY STENT INTERVENTION N/A 03/20/2017   Procedure: DES x 3 LAD, DES OM; Surgeon: Burnell Blanks, MD;  Location: [facility];  Service: Cardiovascular;  Laterality: N/A;   CORONARY/GRAFT ACUTE MI REVASCULARIZATION N/A 02/08/2018   Procedure: Coronary/Graft Acute MI Revascularization;  Surgeon: Belva Crome, MD;  Location: [facility];  Service: Cardiovascular;  Laterality: N/A;   LEFT HEART CATH AND CORONARY ANGIOGRAPHY N/A 03/20/2017   Procedure: LEFT HEART CATH AND CORONARY ANGIOGRAPHY;  Surgeon: Burnell Blanks, MD;  Location: [facility];  Service: Cardiovascular;  Laterality: N/A;   LEFT HEART CATH AND CORONARY ANGIOGRAPHY N/A 02/08/2018   Procedure:  LEFT HEART CATH AND CORONARY ANGIOGRAPHY;  Surgeon: Belva Crome, MD;  Location: [facility];  Service: Cardiovascular;  Laterality: N/A;   LEFT HEART CATH AND CORONARY ANGIOGRAPHY N/A 10/29/2018   Procedure: LEFT HEART CATH AND CORONARY ANGIOGRAPHY;  Surgeon: Burnell Blanks, MD;  Location: [facility];  Service: Cardiovascular;  Laterality: N/A;   TEE WITHOUT CARDIOVERSION N/A 11/04/2018   Procedure: TRANSESOPHAGEAL ECHOCARDIOGRAM (TEE);  Surgeon: Wonda Olds, MD;  Location: [facility];  Service: Open Heart Surgery;  Laterality: N/A;    Social History   Socioeconomic History   Marital  status: Divorced    Spouse name: Not on file   Number of children: Not on file   Years of education: Not on file   Highest education level: Not on file  Occupational History   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"  Tobacco Use   Smoking status: Never Smoker   Smokeless tobacco: Never Used   Tobacco comment: Use marijuana  Vaping Use   Vaping Use: Never used  Substance and Sexual Activity   Alcohol use: No    Alcohol/week: 0.0 standard drinks   Drug use: Not Currently    Types: Marijuana    Comment: last use last week, occaisonal marijuana use   Sexual activity: Not Currently    Birth control/protection: None  Other Topics Concern   Not on file  Social History Narrative   Not on file   Social Determinants of Health   Financial Resource Strain: Not on file  Food Insecurity: Not on file  Transportation Needs: Not on file  Physical Activity: Not on file  Stress: Not on file  Social Connections: Not on file  Intimate Partner Violence: Not on file    Current Outpatient Medications on File Prior to Visit  Medication Sig Dispense Refill   acetaminophen (TYLENOL) 325 MG tablet Take 650 mg by mouth daily as needed (couch/pain/fever).     albuterol (VENTOLIN HFA) 108 (90 Base) MCG/ACT inhaler INHALE 2 PUFFS INTO THE LUNGS EVERY 6 (SIX) HOURS AS NEEDED FOR WHEEZING OR SHORTNESS OF BREATH. 8.5 g 0   Alirocumab (PRALUENT) 150 MG/ML SOAJ Inject 1 pen into the skin every 14 (fourteen) days. 6 mL 3   aspirin 81 MG EC tablet Take 1 tablet (81 mg total) by mouth daily. 90 tablet 0   b complex vitamins tablet Take 1 tablet by mouth daily.     benzonatate (TESSALON) 200 MG capsule Take 1 capsule (200 mg total) by mouth 2 (two) times daily as needed for cough. 60 capsule 6   clopidogrel (PLAVIX) 75 MG tablet TAKE 1 TABLET BY MOUTH DAILY WITH BREAKFAST. 30 tablet 10   diclofenac Sodium (VOLTAREN) 1 % GEL Apply 2 g topically 4 (four) times daily. (Patient taking  differently: Apply 2 g topically daily as needed (For pain). ) 200 g 3   ferrous sulfate 325 (65 FE) MG tablet Take 325 mg by mouth See admin instructions. Take one tablet (325 mg) by mouth once or twice during menstrual cycle     FLUoxetine (PROZAC) 40 MG capsule TAKE 1 CAPSULE BY MOUTH DAILY. 90 capsule 1   fluticasone (FLONASE) 50 MCG/ACT nasal spray Place 1 spray into both nostrils at bedtime.     hydrOXYzine (ATARAX/VISTARIL) 25 MG tablet Take 1 tablet (25 mg total) by mouth 3 (three) times daily as needed. (Patient taking differently: Take 25 mg by mouth 3 (three) times daily as needed for anxiety. ) 60  tablet 1   insulin aspart (NOVOLOG) 100 UNIT/ML injection 35 units per insulin pump daily. 40 mL 3   Insulin Human (INSULIN PUMP) SOLN Inject into the skin continuous. Humalog     insulin lispro (HUMALOG) 100 UNIT/ML injection Inject 35 units daily via pump 10 mL 2   ipratropium (ATROVENT) 0.06 % nasal spray Place 2 sprays into both nostrils 2 (two) times daily as needed for rhinitis.     levothyroxine (EUTHYROX) 100 MCG tablet Take 1 tablet (100 mcg total) by mouth daily before breakfast. 90 tablet 3   LORazepam (ATIVAN) 0.5 MG tablet Take 1 tablet (0.5 mg total) by mouth every 8 (eight) hours as needed for anxiety. 10 tablet 0   metoCLOPramide (REGLAN) 5 MG tablet Take 1 tablet (5 mg total) by mouth every 12 (twelve) hours as needed for nausea or vomiting. 90 tablet 1   montelukast (SINGULAIR) 10 MG tablet Take 1 tablet (10 mg total) by mouth at bedtime. 90 tablet 2   nitroGLYCERIN (NITROSTAT) 0.4 MG SL tablet Place 1 tablet (0.4 mg total) under the tongue every 5 (five) minutes as needed for chest pain. 30 tablet 0   ondansetron (ZOFRAN ODT) 4 MG disintegrating tablet Take 1 tablet (4 mg total) by mouth every 8 (eight) hours as needed for nausea or vomiting. 20 tablet 1   pantoprazole (PROTONIX) 40 MG tablet Take 1 tablet (40 mg total) by mouth 2 (two) times daily as needed  (acid reflux). (Patient taking differently: Take 40 mg by mouth as needed. Pt taking as needed for indigestion/acid reflux.) 60 tablet 3   Phenyleph-Doxyl-DM-Aspirin (ALKA-SELTZER PLUS COLD DY/NGHT PO) Take 1 tablet by mouth at bedtime as needed (cough).     No current facility-administered medications on file prior to visit.    Allergies  Allergen Reactions   Atorvastatin Other (See Comments)    Myalgias on 80mg  dose, tolerates 20mg  ok   Crestor [Rosuvastatin Calcium] Other (See Comments)    Severe myalgias and joint pain. Has tolerated atorvastatin though   Monosodium Glutamate Nausea And Vomiting and Other (See Comments)    migraine   Peanut-Containing Drug Products Other (See Comments)    Vomiting, upset stomach and some wheezing   Zetia [Ezetimibe] Other (See Comments)    myalgias   Ciprofloxin Hcl [Ciprofloxacin] Nausea And Vomiting and Other (See Comments)    Severe migraine   Food Color Red [Red Dye] Diarrhea    Family History  Problem Relation Age of Onset   Coronary artery disease Father    Congestive Heart Failure Father    Asthma Father    Cancer Mother        T cell lymphoma    There were no vitals taken for this visit.   Review of Systems     Objective:   Physical Exam      Assessment & Plan:  Type 1 DM: uncontrolled  Patient Instructions  Please continue these pump settings: basal rate of 0.7 units/hr, 6 AM-10 AM, 0.9 units/hr, 10 AM-10 PM, and 0.625 units/hr overnight (when not in "auto mode").  bolus of 1 unit/16 grams CHO. correction bolus (which some people call "sensitivity," or "insulin sensitivity ratio," or just "isr") of 1 unit for each 100 by which your glucose exceeds 100.   Please come back for a follow-up appointment in 2 months.

## 2020-02-27 ENCOUNTER — Other Ambulatory Visit: Payer: Self-pay | Admitting: Physician Assistant

## 2020-02-27 ENCOUNTER — Other Ambulatory Visit: Payer: Self-pay | Admitting: Nurse Practitioner

## 2020-02-27 ENCOUNTER — Other Ambulatory Visit: Payer: Self-pay

## 2020-02-27 ENCOUNTER — Ambulatory Visit (INDEPENDENT_AMBULATORY_CARE_PROVIDER_SITE_OTHER): Payer: Medicaid Other | Admitting: Endocrinology

## 2020-02-27 DIAGNOSIS — K3184 Gastroparesis: Secondary | ICD-10-CM

## 2020-02-27 DIAGNOSIS — J9801 Acute bronchospasm: Secondary | ICD-10-CM

## 2020-02-27 DIAGNOSIS — E1059 Type 1 diabetes mellitus with other circulatory complications: Secondary | ICD-10-CM

## 2020-02-27 LAB — POCT GLYCOSYLATED HEMOGLOBIN (HGB A1C): Hemoglobin A1C: 9 % — AB (ref 4.0–5.6)

## 2020-02-27 MED FILL — MONTELUKAST SOD 10 MG TAB: 10 | 30 days supply | Qty: 30 | Fill #0

## 2020-02-27 MED FILL — ?CLOPIDOGREL 75 MG TABL: 75 | 30 days supply | Qty: 30 | Fill #5

## 2020-02-27 MED FILL — ?HUMALOG 100 UNITS/ML VIAL: 100 | 28 days supply | Qty: 10 | Fill #2

## 2020-02-27 MED FILL — ONDANSETRON ODT 4 MG TABLET: 4 | 6 days supply | Qty: 20 | Fill #0

## 2020-02-27 MED FILL — ?LEVOTHYROXINE 100 MCG TAB: 100 | 30 days supply | Qty: 30 | Fill #2

## 2020-02-27 MED FILL — FLUoxetine HCL 40 MG CAPS: 40 | 30 days supply | Qty: 30 | Fill #4

## 2020-02-27 MED FILL — ALBUTEROL SULFATE HFA 108 (: 108 (90 BAS | 25 days supply | Qty: 9 | Fill #0

## 2020-02-27 NOTE — BH Specialist Note (Signed)
Integrated Behavioral Health Initial In-Person Visit  MRN: [id number] Name: Jennifer Hogan  Number of [facility] Clinician visits:: 1/6 Session Start time: 4:30 PM  Session End time: 5:30 PM Total time: 60 minutes  Types of Service: Individual psychotherapy  Interpretor:No. Interpretor Name and Language: NA   Subjective: Jennifer Hogan is a 50 y.o. female  Patient was referred by NP Raul Del for anxiety. Patient reports the following symptoms/concerns: Pt reports needing assistance with the ongoing management of anxiety symptoms triggered by life changes and implementing healthy habits Duration of problem: Ongoing; Severity of problem: moderate  Objective: Mood: Pleasant and Affect: Appropriate Risk of harm to self or others: No plan to harm self or others  Life Context: Family and Social: Pt receives support from family and friends School/Work: Pt is employed  Self-Care: Pt participates in medication management  Life Changes: Pt has recently obtained employment  Patient and/or Family's Strengths/Protective Factors: Social connections, Social and Emotional competence, Concrete supports in place (healthy food, safe environments, etc.) and Sense of purpose  Goals Addressed: Patient will: 1. Reduce symptoms of: anxiety Pt agreed to continue compliance with medication management 2. Increase knowledge and/or ability of: self-management skills Pt agreed to utilize strategies discussed to identify and manage cognitive distortions that can negatively impact feelings and behaviors   Progress towards Goals: Ongoing  Interventions: Interventions utilized: CBT Cognitive Behavioral Therapy  Standardized Assessments completed: Not Needed  Patient Response: Pt was strongly engaged during session and was successful in identifying triggers to cognitive distortions.   Patient Centered Plan: Patient is on the following Treatment Plan(s):   Anxiety  Assessment: Patient currently experiencing symptoms of anxiety triggered by cognitive distortions.   Patient may benefit from utilizing health strategies discussed, continue with compliance with medications, and participation in brief therapy.  Plan: 1. Follow up with behavioral health clinician on : 03/03/20 2. Behavioral recommendations: Utilize strategies discussed 3. Referral(s): [facility] (In Clinic) 4. "From scale of 1-10, how likely are you to follow plan?":   Rebekah [place], LCSW 02/27/20 2:38 PM

## 2020-02-27 NOTE — Progress Notes (Signed)
Pt arrived for A1C to be checked

## 2020-02-27 NOTE — Telephone Encounter (Signed)
Requested medication (s) are due for refill today: no  Requested medication (s) are on the active medication list: no  Last refill: 12/17/2018  Future visit scheduled: no  Notes to clinic:  this refill cannot be delegated    Requested Prescriptions  Pending Prescriptions Disp Refills   ondansetron (ZOFRAN-ODT) 4 MG disintegrating tablet [Pharmacy Med Name: ONDANSETRON ODT 4 MG TABLET 4 Tablet] 20 tablet 1    Sig: Take 1 tablet (4 mg total) by mouth every 8 (eight) hours as needed for nausea or vomiting.      Not Delegated - Gastroenterology: Antiemetics Failed - 02/27/2020  8:43 AM      Failed - This refill cannot be delegated      Failed - Valid encounter within last 6 months    Recent Outpatient Visits           8 months ago Cough in adult patient   [place], Zelda W, NP   11 months ago Anxiety and depression   [place], Vernia Buff, NP   1 year ago Gastroparesis   [place], [place]   1 year ago Hospital discharge follow-up   [place], [place] W, NP   2 years ago Moderate episode of recurrent major depressive disorder [facility])   [place], Vernia Buff, NP

## 2020-03-02 ENCOUNTER — Emergency Department (HOSPITAL_COMMUNITY): Payer: Medicaid Other

## 2020-03-02 ENCOUNTER — Encounter (HOSPITAL_COMMUNITY): Payer: Self-pay

## 2020-03-02 ENCOUNTER — Emergency Department (HOSPITAL_COMMUNITY)
Admission: EM | Admit: 2020-03-02 | Discharge: 2020-03-03 | Disposition: A | Discharge status: Home / Self Care | Payer: Medicaid Other | Attending: Emergency Medicine | Admitting: Emergency Medicine

## 2020-03-02 DIAGNOSIS — N179 Acute kidney failure, unspecified: Secondary | ICD-10-CM

## 2020-03-02 DIAGNOSIS — Z7982 Long term (current) use of aspirin: Secondary | ICD-10-CM | POA: Insufficient documentation

## 2020-03-02 DIAGNOSIS — E109 Type 1 diabetes mellitus without complications: Secondary | ICD-10-CM | POA: Insufficient documentation

## 2020-03-02 DIAGNOSIS — Z79899 Other long term (current) drug therapy: Secondary | ICD-10-CM | POA: Insufficient documentation

## 2020-03-02 DIAGNOSIS — Z951 Presence of aortocoronary bypass graft: Secondary | ICD-10-CM | POA: Insufficient documentation

## 2020-03-02 DIAGNOSIS — I2510 Atherosclerotic heart disease of native coronary artery without angina pectoris: Secondary | ICD-10-CM | POA: Insufficient documentation

## 2020-03-02 DIAGNOSIS — E039 Hypothyroidism, unspecified: Secondary | ICD-10-CM | POA: Insufficient documentation

## 2020-03-02 DIAGNOSIS — I5022 Chronic systolic (congestive) heart failure: Secondary | ICD-10-CM | POA: Insufficient documentation

## 2020-03-02 DIAGNOSIS — Z20822 Contact with and (suspected) exposure to covid-19: Secondary | ICD-10-CM | POA: Insufficient documentation

## 2020-03-02 DIAGNOSIS — Z91010 Allergy to peanuts: Secondary | ICD-10-CM | POA: Insufficient documentation

## 2020-03-02 DIAGNOSIS — M25512 Pain in left shoulder: Secondary | ICD-10-CM

## 2020-03-02 DIAGNOSIS — R519 Headache, unspecified: Secondary | ICD-10-CM | POA: Insufficient documentation

## 2020-03-02 LAB — DIFFERENTIAL
Abs Immature Granulocytes: 0.01 10*3/uL (ref 0.00–0.07)
Basophils Absolute: 0.1 10*3/uL (ref 0.0–0.1)
Basophils Relative: 1 %
Eosinophils Absolute: 0.3 10*3/uL (ref 0.0–0.5)
Eosinophils Relative: 4 %
Immature Granulocytes: 0 %
Lymphocytes Relative: 31 %
Lymphs Abs: 2 10*3/uL (ref 0.7–4.0)
Monocytes Absolute: 0.5 10*3/uL (ref 0.1–1.0)
Monocytes Relative: 7 %
Neutro Abs: 3.8 10*3/uL (ref 1.7–7.7)
Neutrophils Relative %: 57 %

## 2020-03-02 LAB — BASIC METABOLIC PANEL
Anion gap: 15 (ref 5–15)
BUN: 16 mg/dL (ref 6–20)
CO2: 20 mmol/L — ABNORMAL LOW (ref 22–32)
Calcium: 9.2 mg/dL (ref 8.9–10.3)
Chloride: 100 mmol/L (ref 98–111)
Creatinine, Ser: 1.63 mg/dL — ABNORMAL HIGH (ref 0.44–1.00)
GFR, Estimated: 38 mL/min — ABNORMAL LOW (ref >60)
Glucose, Bld: 218 mg/dL — ABNORMAL HIGH (ref 70–99)
Potassium: 3.9 mmol/L (ref 3.5–5.1)
Sodium: 135 mmol/L (ref 135–145)

## 2020-03-02 LAB — I-STAT CHEM 8, ED
BUN: 18 mg/dL (ref 6–20)
Calcium, Ion: 1.16 mmol/L (ref 1.15–1.40)
Chloride: 102 mmol/L (ref 98–111)
Creatinine, Ser: 1.6 mg/dL — ABNORMAL HIGH (ref 0.44–1.00)
Glucose, Bld: 218 mg/dL — ABNORMAL HIGH (ref 70–99)
HCT: 36 % (ref 36.0–46.0)
Hemoglobin: 12.2 g/dL (ref 12.0–15.0)
Potassium: 3.9 mmol/L (ref 3.5–5.1)
Sodium: 137 mmol/L (ref 135–145)
TCO2: 25 mmol/L (ref 22–32)

## 2020-03-02 LAB — CBC
HCT: 35.6 % — ABNORMAL LOW (ref 36.0–46.0)
Hemoglobin: 11.2 g/dL — ABNORMAL LOW (ref 12.0–15.0)
MCH: 29.2 pg (ref 26.0–34.0)
MCHC: 31.5 g/dL (ref 30.0–36.0)
MCV: 93 fL (ref 80.0–100.0)
Platelets: 349 10*3/uL (ref 150–400)
RBC: 3.83 MIL/uL — ABNORMAL LOW (ref 3.87–5.11)
RDW: 15.3 % (ref 11.5–15.5)
WBC: 6.6 10*3/uL (ref 4.0–10.5)
nRBC: 0 % (ref 0.0–0.2)

## 2020-03-02 LAB — I-STAT BETA HCG BLOOD, ED (MC, WL, AP ONLY): I-stat hCG, quantitative: <5 m[IU]/mL (ref <5)

## 2020-03-02 LAB — APTT: aPTT: 28 seconds (ref 24–36)

## 2020-03-02 LAB — PROTIME-INR
INR: 1 (ref 0.8–1.2)
Prothrombin Time: 13 seconds (ref 11.4–15.2)

## 2020-03-02 LAB — TROPONIN I (HIGH SENSITIVITY): Troponin I (High Sensitivity): 5 ng/L (ref <18)

## 2020-03-02 MED ORDER — SODIUM CHLORIDE 0.9% FLUSH
3.0000 mL | Freq: Once | INTRAVENOUS | Status: AC
Start: 2020-03-02 — End: 2020-03-03
  Administered 2020-03-03: 3 mL via INTRAVENOUS

## 2020-03-02 NOTE — ED Triage Notes (Signed)
Pt reports that for the past week she has been having pain in her back that radiates to her chest and L arm, SOB, hx of MI, pt reports that her L had has been numb and tingling since early this afternoon. Neuro intact bilaterally

## 2020-03-02 NOTE — Medical Student Note (Signed)
[facility] DEPT Provider Student Note For educational purposes for Medical, PA and NP students only and not part of the legal medical record.   CSN: [id number] Arrival date & time: 03/02/20  2112      History   Chief Complaint Chief Complaint  Patient presents with  . Chest Pain    HPI LOISE ESGUERRA is a 50 y.o. female with PMHx significant for CAD s/p DES x3 in 2020 on DAPT, STEMI, T1DM who presents to the ER with 1 week of back pain.   States that last Wednesday she noticed left subscapular pain that radiated to her chest. States that the next day she started to feel short of breath. 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 Over the weekend she then states she was having pressure in her chest that went to her left arm and weakness. She states she took ASA and x2 NTG that relieved her pain and she fell asleep. When she woke up the pain was back. She describes the pain as constant pressure. What prompted her ED visit today was numbness in her left arm that occurred while she was driving. She denies this being like her previous MI in which she had 3 days of nausea and severe chest and abdominal pain.   She denies fevers, cough, sinus congestion or pain. Denies palpitations. Denies lower extremity swelling or pain. Denies recent travel history or sick contacts.    Past Medical History:  Diagnosis Date  . Anemia   . Anxiety   . Coronary artery disease    a. s/pPTCA DES x3 in the LAD (ostial DES placed, mid DES placed, distal DES placed)and PTCA/DES x1to intermediate branch 02/2017. b. ACS 02/08/18 with LCx stent placement.  . Depression   . Diabetes mellitus    type 1, on insulin pump  . DKA (diabetic ketoacidoses) 12/31/2017  . Elevated platelet count   . High cholesterol   . Hypothyroidism   . Ischemic cardiomyopathy    a. EF low-normal with basal inferior akinesis, basal septal hypokinesis by  echo 01/2018.    Patient Active Problem List   Diagnosis Date Noted  . Emesis 05/01/2019  . AKI (acute kidney injury) ([place]) 05/01/2019  . Severe hyperglycemia due to diabetes mellitus ([place]) 03/15/2019  . Pleural effusion 12/09/2018  . Abdominal pain 11/28/2018  . Intractable nausea and vomiting 11/28/2018  . Diarrhea 11/28/2018  . DKA (diabetic ketoacidoses) 11/25/2018  . Sinus tachycardia 11/24/2018  . Chronic systolic heart failure ([place])   . Cardiomyopathy ([place])   . Hypotension 10/29/2018  . DKA, type 1 ([place]) 05/24/2018  . Moderate episode of recurrent major depressive disorder ([place]) 03/27/2018  . Epigastric pain   . Hyperglycemia due to type 1 diabetes mellitus ([place]) 02/14/2018  . Hyperlipidemia LDL goal <70 02/14/2018  . S/P drug eluting coronary stent placement   . ACS (acute coronary syndrome) ([place]) 02/08/2018  . ST elevation myocardial infarction (STEMI) ([place]) 02/08/2018  . DKA (diabetic ketoacidosis) ([place]) 12/31/2017  . Marijuana abuse 12/31/2017  . Gastroparesis 12/06/2017  . Gastroparesis due to DM ([place]) 03/28/2017  . Coronary artery disease 03/28/2017  . Ischemic cardiomyopathy 03/28/2017  . Pure hypercholesterolemia   . NSTEMI (non-ST elevated myocardial infarction) ([employer])   . Anemia 03/17/2017  . Allergic state 08/07/2015  . Vitiligo 07/09/2012  . Type 1 diabetes mellitus ([place]) 07/09/2012  . Depression 12/04/2010  . Non-intractable vomiting with  nausea 12/04/2010  . Hypothyroidism 12/04/2010    Past Surgical History:  Procedure Laterality Date  . APPENDECTOMY    . CORONARY ARTERY BYPASS GRAFT N/A 11/04/2018   Procedure: CORONARY ARTERY BYPASS GRAFTING (CABG), ON PUMP, TIMES THREE, USING LEFT AND RIGHT INTERNAL MAMMARY ARTERIES;  Surgeon: Wonda Olds, MD;  Location: [facility];  Service: Open Heart Surgery;  Laterality: N/A;  . CORONARY STENT INTERVENTION N/A 03/20/2017   Procedure: DES x 3 LAD, DES OM; Surgeon: Burnell Blanks, MD;  Location: [facility];  Service: Cardiovascular;  Laterality: N/A;  . CORONARY/GRAFT ACUTE MI REVASCULARIZATION N/A 02/08/2018   Procedure: Coronary/Graft Acute MI Revascularization;  Surgeon: Belva Crome, MD;  Location: [facility];  Service: Cardiovascular;  Laterality: N/A;  . LEFT HEART CATH AND CORONARY ANGIOGRAPHY N/A 03/20/2017   Procedure: LEFT HEART CATH AND CORONARY ANGIOGRAPHY;  Surgeon: Burnell Blanks, MD;  Location: [facility];  Service: Cardiovascular;  Laterality: N/A;  . LEFT HEART CATH AND CORONARY ANGIOGRAPHY N/A 02/08/2018   Procedure: LEFT HEART CATH AND CORONARY ANGIOGRAPHY;  Surgeon: Belva Crome, MD;  Location: [facility];  Service: Cardiovascular;  Laterality: N/A;  . LEFT HEART CATH AND CORONARY ANGIOGRAPHY N/A 10/29/2018   Procedure: LEFT HEART CATH AND CORONARY ANGIOGRAPHY;  Surgeon: Burnell Blanks, MD;  Location: [facility];  Service: Cardiovascular;  Laterality: N/A;  . TEE WITHOUT CARDIOVERSION N/A 11/04/2018   Procedure: TRANSESOPHAGEAL ECHOCARDIOGRAM (TEE);  Surgeon: Wonda Olds, MD;  Location: [facility];  Service: Open Heart Surgery;  Laterality: N/A;    OB History    Gravida  0   Para  0   Term  0   Preterm  0   AB  0   Living  0     SAB  0   IAB  0   Ectopic  0   Multiple  0   Live Births  0          Home Medications    Prior to Admission medications   Medication Sig Start Date End Date Taking? Authorizing Provider  acetaminophen (TYLENOL) 325 MG tablet Take 650 mg by mouth daily as needed (couch/pain/fever).    [provider]  albuterol (VENTOLIN HFA) 108 (90 Base) MCG/ACT inhaler INHALE 2 PUFFS INTO THE LUNGS EVERY 6 (SIX) HOURS AS NEEDED FOR WHEEZING OR SHORTNESS OF BREATH. 01/26/20 02/25/20  [name], NP  Alirocumab (PRALUENT) 150 MG/ML SOAJ Inject 1 pen into the skin every 14 (fourteen) days. 12/23/19   [name], MD  aspirin 81 MG EC tablet Take 1 tablet (81 mg  total) by mouth daily. 12/07/17   Amin, Jeanella Flattery, MD  b complex vitamins tablet Take 1 tablet by mouth daily.    [provider]  benzonatate (TESSALON) 200 MG capsule Take 1 capsule (200 mg total) by mouth 2 (two) times daily as needed for cough. 06/03/19   [name], NP  clopidogrel (PLAVIX) 75 MG tablet TAKE 1 TABLET BY MOUTH DAILY WITH BREAKFAST. 08/25/19   [name], MD  diclofenac Sodium (VOLTAREN) 1 % GEL Apply 2 g topically 4 (four) times daily. Patient taking differently: Apply 2 g topically daily as needed (For pain).  03/19/19   [name], NP  ferrous sulfate 325 (65 FE) MG tablet Take 325 mg by mouth See admin instructions. Take one tablet (325 mg) by mouth once or twice during menstrual cycle  [provider]  FLUoxetine (PROZAC) 40 MG capsule TAKE 1 CAPSULE BY MOUTH DAILY. 10/08/19   [name], NP  fluticasone (FLONASE) 50 MCG/ACT nasal spray Place 1 spray into both nostrils at bedtime.    [provider]  hydrOXYzine (ATARAX/VISTARIL) 25 MG tablet Take 1 tablet (25 mg total) by mouth 3 (three) times daily as needed. Patient taking differently: Take 25 mg by mouth 3 (three) times daily as needed for anxiety.  03/19/19   [name], NP  insulin aspart (NOVOLOG) 100 UNIT/ML injection 35 units per insulin pump daily. 11/17/19   Renato Shin, MD  Insulin Human (INSULIN PUMP) SOLN Inject into the skin continuous. Humalog    [provider]  insulin lispro (HUMALOG) 100 UNIT/ML injection Inject 35 units daily via pump 08/26/19   Renato Shin, MD  ipratropium (ATROVENT) 0.06 % nasal spray Place 2 sprays into both nostrils 2 (two) times daily as needed for rhinitis.    [provider]  levothyroxine (EUTHYROX) 100 MCG tablet Take 1 tablet (100 mcg total) by mouth daily before breakfast. 11/17/19   Renato Shin, MD  LORazepam (ATIVAN) 0.5 MG tablet Take 1 tablet (0.5 mg total) by mouth every 8 (eight) hours as needed  for anxiety. 07/13/19   Ripley Fraise, MD  metoCLOPramide (REGLAN) 5 MG tablet Take 1 tablet (5 mg total) by mouth every 12 (twelve) hours as needed for nausea or vomiting. 12/17/18 12/17/19  Argentina Donovan, PA-C  montelukast (SINGULAIR) 10 MG tablet TAKE 1 TABLET (10 MG TOTAL) BY MOUTH AT BEDTIME. 02/27/20 05/27/20  [name], NP  nitroGLYCERIN (NITROSTAT) 0.4 MG SL tablet Place 1 tablet (0.4 mg total) under the tongue every 5 (five) minutes as needed for chest pain. 01/08/19   [name], MD  ondansetron (ZOFRAN-ODT) 4 MG disintegrating tablet TAKE 1 TABLET (4 MG TOTAL) BY MOUTH EVERY 8 (EIGHT) HOURS AS NEEDED FOR NAUSEA OR VOMITING. 02/27/20   [name], NP  pantoprazole (PROTONIX) 40 MG tablet Take 1 tablet (40 mg total) by mouth 2 (two) times daily as needed (acid reflux). Patient taking differently: Take 40 mg by mouth as needed. Pt taking as needed for indigestion/acid reflux. 06/03/19 08/18/19  [name], NP  Phenyleph-Doxyl-DM-Aspirin (ALKA-SELTZER PLUS COLD DY/NGHT PO) Take 1 tablet by mouth at bedtime as needed (cough).    [provider]    Family History Family History  Problem Relation Age of Onset  . Coronary artery disease Father   . Congestive Heart Failure Father   . Asthma Father   . Cancer Mother        T cell lymphoma    Social History Social History   Tobacco Use  . Smoking status: Never Smoker  . Smokeless tobacco: Never Used  . Tobacco comment: Use marijuana  Vaping Use  . Vaping Use: Never used  Substance Use Topics  . Alcohol use: No    Alcohol/week: 0.0 standard drinks  . Drug use: Not Currently    Types: Marijuana    Comment: last use last week, occaisonal marijuana use     Allergies   Atorvastatin, Crestor [rosuvastatin calcium], Monosodium glutamate, Peanut-containing drug products, Zetia [ezetimibe], Ciprofloxin hcl [ciprofloxacin], and Food color red [red dye]   Review of Systems Review of Systems   Constitutional: Positive for fatigue. Negative for diaphoresis and fever.  HENT: Negative for sinus pain and sore throat.   Eyes: Negative.   Respiratory: Positive for chest tightness and shortness of  breath. Negative for cough.   Cardiovascular: Positive for chest pain. Negative for palpitations and leg swelling.  Gastrointestinal: Negative for nausea and vomiting.  Genitourinary: Negative.   Musculoskeletal: Positive for back pain and neck pain.  Skin: Negative for pallor.  Neurological: Positive for numbness. Negative for dizziness, syncope and light-headedness.       Numbness/tingling to LUE  Hematological: Negative.   Psychiatric/Behavioral: Negative.      Physical Exam Updated Vital Signs BP (!) 153/88 (BP Location: Right Arm)   Pulse (!) 106   Temp 98.1 F (36.7 C) (Oral)   Resp 18   SpO2 100%   Physical Exam Constitutional:      General: She is not in acute distress.    Appearance: She is well-developed. She is not toxic-appearing or diaphoretic.  HENT:     Head: Normocephalic and atraumatic.  Eyes:     Pupils: Pupils are equal, round, and reactive to light.  Neck:     Vascular: No JVD.  Cardiovascular:     Rate and Rhythm: Tachycardia present.     Pulses:          Radial pulses are 2+ on the right side and 2+ on the left side.     Heart sounds: Normal heart sounds.  Pulmonary:     Effort: Pulmonary effort is normal. No respiratory distress.     Breath sounds: Examination of the right-middle field reveals wheezing. Wheezing present.  Abdominal:     General: Bowel sounds are normal.     Palpations: Abdomen is soft.  Musculoskeletal:     Cervical back: Normal range of motion.     Right lower leg: No tenderness. No edema.     Left lower leg: No tenderness. No edema.  Skin:    General: Skin is warm and dry.     Capillary Refill: Capillary refill takes less than 2 seconds.  Neurological:     General: No focal deficit present.     Mental Status: She is alert  and oriented to person, place, and time.     GCS: GCS eye subscore is 4. GCS verbal subscore is 5. GCS motor subscore is 6.     Cranial Nerves: Cranial nerves are intact. No cranial nerve deficit.     Sensory: Sensation is intact.     Motor: No weakness.  Psychiatric:        Mood and Affect: Mood normal.    ED Treatments / Results  Labs (all labs ordered are listed, but only abnormal results are displayed) Labs Reviewed  BASIC METABOLIC PANEL - Abnormal; Notable for the following components:      Result Value   CO2 20 (*)    Glucose, Bld 218 (*)    Creatinine, Ser 1.63 (*)    GFR, Estimated 38 (*)    All other components within normal limits  CBC - Abnormal; Notable for the following components:   RBC 3.83 (*)    Hemoglobin 11.2 (*)    HCT 35.6 (*)    All other components within normal limits  I-STAT CHEM 8, ED - Abnormal; Notable for the following components:   Creatinine, Ser 1.60 (*)    Glucose, Bld 218 (*)    All other components within normal limits  PROTIME-INR  APTT  DIFFERENTIAL  I-STAT BETA HCG BLOOD, ED ([facility], [facility], [facility] ONLY)  I-STAT BETA HCG BLOOD, ED ([facility], [facility], [facility] ONLY)  CBG MONITORING, ED  TROPONIN I (HIGH SENSITIVITY)  TROPONIN I (HIGH SENSITIVITY)  EKG  Radiology DG Chest 2 View  Result Date: 03/02/2020 CLINICAL DATA:  Chest pain x1 day EXAM: CHEST - 2 VIEW COMPARISON:  Chest radiograph July 13, 2019 FINDINGS: Prior median sternotomy. The heart size and mediastinal contours are within normal limits. Both lungs are clear. The visualized skeletal structures are unremarkable. IMPRESSION: No active cardiopulmonary disease. Electronically Signed   By: Dahlia Bailiff MD   On: 03/02/2020 21:55   CT HEAD WO CONTRAST  Result Date: 03/02/2020 CLINICAL DATA:  TIA, left side headache EXAM: CT HEAD WITHOUT CONTRAST TECHNIQUE: Contiguous axial images were obtained from the base of the skull through the vertex without intravenous contrast. COMPARISON:  None. FINDINGS: Brain:  No acute intracranial abnormality. Specifically, no hemorrhage, hydrocephalus, mass lesion, acute infarction, or significant intracranial injury. Vascular: No hyperdense vessel or unexpected calcification. Skull: No acute calvarial abnormality. Sinuses/Orbits: Visualized paranasal sinuses and mastoids clear. Orbital soft tissues unremarkable. Other: None IMPRESSION: Normal study. Electronically Signed   By: Rolm Baptise M.D.   On: 03/02/2020 21:52    Procedures Procedures (including critical care time)  Medications Ordered in ED Medications  sodium chloride flush (NS) 0.9 % injection 3 mL (has no administration in time range)     Initial Impression / Assessment and Plan / ED Course  I have reviewed the triage vital signs and the nursing notes.  Pertinent labs & imaging results that were available during my care of the patient were reviewed by me and considered in my medical decision making (see chart for details).  Laquiesha Piacente is a 49yoF with PMHx STEMI s/p DES x2 on DAPT, T1DM, HTN who presented to ED with 1 week of chest and back pain with shortness of breath.   At this time, differential includes pulmonary embolism, COVID-19, musculoskeletal pain. It is not STEMI or NSTEMI at this time as EKG is unchanged, troponin negative x1. Will draw second troponin. Pneumonia or viral infection unlikely as CXR normal, no URI symptoms on exam, WBC normal however will draw COVID-19.   Will draw d-dimer to r/o PE. She does not have lower extremity swelling or tenderness. However, she is tachycardic to 106, with a pertinent history of breathlessness x1 week. She is on DAPT and coags are WNL. She does not smoke and no recent travel history. If normal will have her follow-up with cardiology   Creatinine 1.6 which is up from baseline of 1. Will given 1L fluids. Likely dehydration over the past week.   0149: Troponin x2 negative. D-dimer negative.   Final Clinical Impressions(s) / ED Diagnoses    Final diagnoses:  AKI (acute kidney injury) ([place])  Acute pain of left shoulder    New Prescriptions New Prescriptions   No medications on file

## 2020-03-03 ENCOUNTER — Ambulatory Visit: Payer: Self-pay | Attending: Nurse Practitioner | Admitting: Licensed Clinical Social Worker

## 2020-03-03 ENCOUNTER — Other Ambulatory Visit: Payer: Self-pay

## 2020-03-03 LAB — TROPONIN I (HIGH SENSITIVITY): Troponin I (High Sensitivity): 4 ng/L (ref <18)

## 2020-03-03 LAB — D-DIMER, QUANTITATIVE: D-Dimer, Quant: 0.28 ug/mL-FEU (ref 0.00–0.50)

## 2020-03-03 LAB — SARS CORONAVIRUS 2 (TAT 6-24 HRS): SARS Coronavirus 2: NEGATIVE

## 2020-03-03 MED ORDER — LACTATED RINGERS IV BOLUS
1000.0000 mL | Freq: Once | INTRAVENOUS | Status: AC
  Administered 2020-03-03: 1000 mL via INTRAVENOUS

## 2020-03-03 NOTE — ED Provider Notes (Signed)
[facility] EMERGENCY DEPARTMENT Provider Note   CSN: [id number] Arrival date & time: 03/02/20  2112     History Chief Complaint  Patient presents with  . Chest Pain    Jennifer Hogan is a 50 y.o. female.  50 year old female with extensive cardiac history, diabetes, hyperlipidemia who presents the emerge department today secondary to multiple symptoms.  The patient first noticed some type of abnormal feeling in the periscapular region a few days ago.  She had few days out of character fatigue requiring multiple hours of sleep.  She also started having some dyspnea.  The pain started to move up to her shoulder.  She started having sharp pain in her left shoulder and lateral neck that was worse with movement and palpation.  This continued and patient had worsening fatigue.  She initially was just blowing it off and try to get through it and thought she might have Covid.  She then states that her left arm darted having paresthesias and became numb and that is what worried her and brought her here for concern of heart attack.  No lower extremity swelling.  She states that she thinks he might have Covid about a month and half ago.  No other associated symptoms.  No lightheadedness.  She is been eating and drinking normally.        Past Medical History:  Diagnosis Date  . Anemia   . Anxiety   . Coronary artery disease    a. s/pPTCA DES x3 in the LAD (ostial DES placed, mid DES placed, distal DES placed)and PTCA/DES x1to intermediate branch 02/2017. b. ACS 02/08/18 with LCx stent placement.  . Depression   . Diabetes mellitus    type 1, on insulin pump  . DKA (diabetic ketoacidoses) 12/31/2017  . Elevated platelet count   . High cholesterol   . Hypothyroidism   . Ischemic cardiomyopathy    a. EF low-normal with basal inferior akinesis, basal septal hypokinesis by echo 01/2018.    Patient Active Problem List   Diagnosis Date Noted  . Emesis 05/01/2019  . AKI  (acute kidney injury) ([place]) 05/01/2019  . Severe hyperglycemia due to diabetes mellitus ([place]) 03/15/2019  . Pleural effusion 12/09/2018  . Abdominal pain 11/28/2018  . Intractable nausea and vomiting 11/28/2018  . Diarrhea 11/28/2018  . DKA (diabetic ketoacidoses) 11/25/2018  . Sinus tachycardia 11/24/2018  . Chronic systolic heart failure ([place])   . Cardiomyopathy ([place])   . Hypotension 10/29/2018  . DKA, type 1 ([place]) 05/24/2018  . Moderate episode of recurrent major depressive disorder ([place]) 03/27/2018  . Epigastric pain   . Hyperglycemia due to type 1 diabetes mellitus ([place]) 02/14/2018  . Hyperlipidemia LDL goal <70 02/14/2018  . S/P drug eluting coronary stent placement   . ACS (acute coronary syndrome) ([place]) 02/08/2018  . ST elevation myocardial infarction (STEMI) ([place]) 02/08/2018  . DKA (diabetic ketoacidosis) ([place]) 12/31/2017  . Marijuana abuse 12/31/2017  . Gastroparesis 12/06/2017  . Gastroparesis due to DM ([place]) 03/28/2017  . Coronary artery disease 03/28/2017  . Ischemic cardiomyopathy 03/28/2017  . Pure hypercholesterolemia   . NSTEMI (non-ST elevated myocardial infarction) ([place])   . Anemia 03/17/2017  . Allergic state 08/07/2015  . Vitiligo 07/09/2012  . Type 1 diabetes mellitus ([place]) 07/09/2012  . Depression 12/04/2010  . Non-intractable vomiting with nausea 12/04/2010  . Hypothyroidism 12/04/2010    Past Surgical History:  Procedure Laterality Date  . APPENDECTOMY    . CORONARY ARTERY BYPASS GRAFT N/A 11/04/2018  Procedure: CORONARY ARTERY BYPASS GRAFTING (CABG), ON PUMP, TIMES THREE, USING LEFT AND RIGHT INTERNAL MAMMARY ARTERIES;  Surgeon: Wonda Olds, MD;  Location: [place];  Service: Open Heart Surgery;  Laterality: N/A;  . CORONARY STENT INTERVENTION N/A 03/20/2017   Procedure: DES x 3 LAD, DES OM; Surgeon: Burnell Blanks, MD;  Location: [place] CV LAB;  Service: Cardiovascular;  Laterality: N/A;  . CORONARY/GRAFT ACUTE MI  REVASCULARIZATION N/A 02/08/2018   Procedure: Coronary/Graft Acute MI Revascularization;  Surgeon: Belva Crome, MD;  Location: [place] CV LAB;  Service: Cardiovascular;  Laterality: N/A;  . LEFT HEART CATH AND CORONARY ANGIOGRAPHY N/A 03/20/2017   Procedure: LEFT HEART CATH AND CORONARY ANGIOGRAPHY;  Surgeon: Burnell Blanks, MD;  Location: [place] CV LAB;  Service: Cardiovascular;  Laterality: N/A;  . LEFT HEART CATH AND CORONARY ANGIOGRAPHY N/A 02/08/2018   Procedure: LEFT HEART CATH AND CORONARY ANGIOGRAPHY;  Surgeon: Belva Crome, MD;  Location: [place] [facility];  Service: Cardiovascular;  Laterality: N/A;  . LEFT HEART CATH AND CORONARY ANGIOGRAPHY N/A 10/29/2018   Procedure: LEFT HEART CATH AND CORONARY ANGIOGRAPHY;  Surgeon: Burnell Blanks, MD;  Location: [place] [facility];  Service: Cardiovascular;  Laterality: N/A;  . TEE WITHOUT CARDIOVERSION N/A 11/04/2018   Procedure: TRANSESOPHAGEAL ECHOCARDIOGRAM (TEE);  Surgeon: Wonda Olds, MD;  Location: [place];  Service: Open Heart Surgery;  Laterality: N/A;     OB History    Gravida  0   Para  0   Term  0   Preterm  0   AB  0   Living  0     SAB  0   IAB  0   Ectopic  0   Multiple  0   Live Births  0           Family History  Problem Relation Age of Onset  . Coronary artery disease Father   . Congestive Heart Failure Father   . Asthma Father   . Cancer Mother        T cell lymphoma    Social History   Tobacco Use  . Smoking status: Never Smoker  . Smokeless tobacco: Never Used  . Tobacco comment: Use marijuana  Vaping Use  . Vaping Use: Never used  Substance Use Topics  . Alcohol use: No    Alcohol/week: 0.0 standard drinks  . Drug use: Not Currently    Types: Marijuana    Comment: last use last week, occaisonal marijuana use    Home Medications Prior to Admission medications   Medication Sig Start Date End Date Taking? Authorizing Provider  acetaminophen  (TYLENOL) 325 MG tablet Take 650 mg by mouth daily as needed (couch/pain/fever).   Yes [provider]  albuterol (VENTOLIN HFA) 108 (90 Base) MCG/ACT inhaler INHALE 2 PUFFS INTO THE LUNGS EVERY 6 (SIX) HOURS AS NEEDED FOR WHEEZING OR SHORTNESS OF BREATH. 01/26/20 02/25/20 Yes [name], NP  Alirocumab (PRALUENT) 150 MG/ML SOAJ Inject 150 mg into the skin every 14 (fourteen) days. 12/23/19  Yes Turner, Eber Hong, MD  Alpha-Lipoic Acid 100 MG TABS Take 100-200 tablets by mouth daily.   Yes [provider]  aspirin 81 MG EC tablet Take 1 tablet (81 mg total) by mouth daily. Patient taking differently: Take 81 mg by mouth at bedtime. 12/07/17  Yes Amin, Jeanella Flattery, MD  b complex vitamins tablet Take 1 tablet by mouth daily.   Yes [provider]  benzonatate (TESSALON) 200 MG capsule Take 1 capsule (200 mg total) by mouth 2 (two) times daily as needed for cough. 06/03/19  Yes [name], NP  clopidogrel (PLAVIX) 75 MG tablet TAKE 1 TABLET BY MOUTH DAILY WITH BREAKFAST. Patient taking differently: Take 75 mg by mouth at bedtime. 08/25/19  Yes Turner, Eber Hong, MD  diclofenac Sodium (VOLTAREN) 1 % GEL Apply 2 g topically 4 (four) times daily. Patient taking differently: Apply 2 g topically daily as needed (For pain). 03/19/19  Yes [name], NP  ferrous sulfate 325 (65 FE) MG tablet Take 325 mg by mouth See admin instructions. Take one tablet (325 mg) by mouth once or twice during menstrual cycle   Yes [provider]  FLUoxetine (PROZAC) 40 MG capsule TAKE 1 CAPSULE BY MOUTH DAILY. Patient taking differently: Take 40 mg by mouth at bedtime. 10/08/19  Yes [name], NP  fluticasone (FLONASE) 50 MCG/ACT nasal spray Place 1 spray into both nostrils at bedtime as needed for allergies.   Yes [provider]  hydrOXYzine (ATARAX/VISTARIL) 25 MG tablet Take 1 tablet (25 mg total) by mouth 3 (three) times daily as needed. Patient taking differently:  Take 25 mg by mouth 3 (three) times daily as needed for anxiety. 03/19/19  Yes [name], NP  Insulin Human (INSULIN PUMP) SOLN Inject into the skin continuous. Humalog   Yes [provider]  insulin lispro (HUMALOG) 100 UNIT/ML injection Inject 35 units daily via pump 08/26/19  Yes Renato Shin, MD  ipratropium (ATROVENT) 0.06 % nasal spray Place 2 sprays into both nostrils 2 (two) times daily as needed for rhinitis.   Yes [provider]  levothyroxine (EUTHYROX) 100 MCG tablet Take 1 tablet (100 mcg total) by mouth daily before breakfast. 11/17/19  Yes Renato Shin, MD  LORazepam (ATIVAN) 0.5 MG tablet Take 1 tablet (0.5 mg total) by mouth every 8 (eight) hours as needed for anxiety. 07/13/19  Yes Ripley Fraise, MD  meloxicam (MOBIC) 15 MG tablet Take 15 mg by mouth daily as needed for pain.   Yes [provider]  metoCLOPramide (REGLAN) 5 MG tablet Take 1 tablet (5 mg total) by mouth every 12 (twelve) hours as needed for nausea or vomiting. 12/17/18 12/17/19 Yes McClung, Dionne Bucy, PA-C  montelukast (SINGULAIR) 10 MG tablet TAKE 1 TABLET (10 MG TOTAL) BY MOUTH AT BEDTIME. 02/27/20 05/27/20 Yes [name], NP  nitroGLYCERIN (NITROSTAT) 0.4 MG SL tablet Place 1 tablet (0.4 mg total) under the tongue every 5 (five) minutes as needed for chest pain. 01/08/19  Yes Turner, Traci R, MD  ondansetron (ZOFRAN-ODT) 4 MG disintegrating tablet TAKE 1 TABLET (4 MG TOTAL) BY MOUTH EVERY 8 (EIGHT) HOURS AS NEEDED FOR NAUSEA OR VOMITING. 02/27/20  Yes [name], NP  pantoprazole (PROTONIX) 40 MG tablet Take 1 tablet (40 mg total) by mouth 2 (two) times daily as needed (acid reflux). Patient taking differently: Take 40 mg by mouth as needed. Pt taking as needed for indigestion/acid reflux. 06/03/19 08/18/19 Yes [name], NP  Phenyleph-Doxyl-DM-Aspirin (ALKA-SELTZER PLUS COLD DY/NGHT PO) Take 1 tablet by mouth at bedtime as needed (cough).   Yes [provider]   insulin aspart (NOVOLOG) 100 UNIT/ML injection 35 units per insulin pump daily. 11/17/19   Renato Shin, MD    Allergies    Atorvastatin, Crestor [rosuvastatin calcium], Monosodium glutamate, Peanut-containing drug products, Zetia [ezetimibe], Ciprofloxin hcl [ciprofloxacin], and Food color red [red dye]  Review of  Systems   Review of Systems  All other systems reviewed and are negative.   Physical Exam Updated Vital Signs BP 93/73   Pulse 80   Temp 98.1 F (36.7 C) (Oral)   Resp 19   SpO2 100%   Physical Exam Vitals and nursing note reviewed.  Constitutional:      Appearance: She is well-developed and well-nourished.  HENT:     Head: Normocephalic and atraumatic.     Mouth/Throat:     Mouth: Mucous membranes are moist.     Pharynx: Oropharynx is clear.  Eyes:     Pupils: Pupils are equal, round, and reactive to light.  Cardiovascular:     Rate and Rhythm: Normal rate and regular rhythm.  Pulmonary:     Effort: No respiratory distress.     Breath sounds: No stridor.  Abdominal:     General: Abdomen is flat. There is no distension.  Musculoskeletal:        General: Tenderness ( Over left trapezius area) present. No swelling. Normal range of motion.     Cervical back: Normal range of motion.  Skin:    General: Skin is warm and dry.     Coloration: Skin is not jaundiced or pale.  Neurological:     General: No focal deficit present.     Mental Status: She is alert.  Psychiatric:        Mood and Affect: Mood normal.     ED Results / Procedures / Treatments   Labs (all labs ordered are listed, but only abnormal results are displayed) Labs Reviewed  BASIC METABOLIC PANEL - Abnormal; Notable for the following components:      Result Value   CO2 20 (*)    Glucose, Bld 218 (*)    Creatinine, Ser 1.63 (*)    GFR, Estimated 38 (*)    All other components within normal limits  CBC - Abnormal; Notable for the following components:   RBC 3.83 (*)    Hemoglobin  11.2 (*)    HCT 35.6 (*)    All other components within normal limits  I-STAT CHEM 8, ED - Abnormal; Notable for the following components:   Creatinine, Ser 1.60 (*)    Glucose, Bld 218 (*)    All other components within normal limits  SARS CORONAVIRUS 2 (TAT 6-24 HRS)  PROTIME-INR  APTT  DIFFERENTIAL  D-DIMER, QUANTITATIVE (NOT AT [facility])  BRAIN NATRIURETIC PEPTIDE  I-STAT BETA HCG BLOOD, ED ([facility], [facility], AP ONLY)  I-STAT BETA HCG BLOOD, ED ([facility], [facility], [facility] ONLY)  CBG MONITORING, ED  TROPONIN I (HIGH SENSITIVITY)  TROPONIN I (HIGH SENSITIVITY)    EKG EKG Interpretation  Date/Time:  Tuesday March 02 2020 21:17:41 EST Ventricular Rate:  89 PR Interval:  132 QRS Duration: 72 QT Interval:  362 QTC Calculation: 440 R Axis:   85 Text Interpretation: Normal sinus rhythm Biatrial enlargement Nonspecific ST abnormality Abnormal ECG Confirmed by Merrily Pew [id number]) on 03/02/2020 11:28:15 PM   Radiology DG Chest 2 View  Result Date: 03/02/2020 CLINICAL DATA:  Chest pain x1 day EXAM: CHEST - 2 VIEW COMPARISON:  Chest radiograph July 13, 2019 FINDINGS: Prior median sternotomy. The heart size and mediastinal contours are within normal limits. Both lungs are clear. The visualized skeletal structures are unremarkable. IMPRESSION: No active cardiopulmonary disease. Electronically Signed   By: Dahlia Bailiff MD   On: 03/02/2020 21:55   CT HEAD WO CONTRAST  Result Date: 03/02/2020 CLINICAL DATA:  TIA, left  side headache EXAM: CT HEAD WITHOUT CONTRAST TECHNIQUE: Contiguous axial images were obtained from the base of the skull through the vertex without intravenous contrast. COMPARISON:  None. FINDINGS: Brain: No acute intracranial abnormality. Specifically, no hemorrhage, hydrocephalus, mass lesion, acute infarction, or significant intracranial injury. Vascular: No hyperdense vessel or unexpected calcification. Skull: No acute calvarial abnormality. Sinuses/Orbits: Visualized paranasal sinuses and mastoids  clear. Orbital soft tissues unremarkable. Other: None IMPRESSION: Normal study. Electronically Signed   By: Rolm Baptise M.D.   On: 03/02/2020 21:52    Procedures Procedures   Medications Ordered in ED Medications  sodium chloride flush (NS) 0.9 % injection 3 mL (3 mLs Intravenous Given 03/03/20 0227)  lactated ringers bolus 1,000 mL (0 mLs Intravenous Stopped 03/03/20 0358)    ED Course  I have reviewed the triage vital signs and the nursing notes.  Pertinent labs & imaging results that were available during my care of the patient were reviewed by me and considered in my medical decision making (see chart for details).    MDM Rules/Calculators/A&P                          Multiple etiologies for the patient's discomfort.  I considered cardiac that is why delta troponins and EKG were done and those were unremarkable.  Considered pulmonary embolus however low risk that is unlikely.  Could be infectious Covid is pending.  Could be some other type of viral infection and just got her down and dehydrated as she does have an AKI on her labs.  She was given fluids.  Heart rate improved without as well.  She felt unchanged however this time I will see any cause for admission.  I did discuss with allergy to ensure to be something they think she needs in the hospital for and I thought she does need a close outpatient follow-up.  I discussed this with the patient.  She is okay with this plan.  She was discharged in stable condition.  Final Clinical Impression(s) / ED Diagnoses Final diagnoses:  AKI (acute kidney injury) ([place])  Acute pain of left shoulder    Rx / DC Orders ED Discharge Orders    None       Avien Taha, Corene Cornea, MD 03/03/20 [phone]

## 2020-03-04 ENCOUNTER — Ambulatory Visit (INDEPENDENT_AMBULATORY_CARE_PROVIDER_SITE_OTHER): Payer: Self-pay | Admitting: Licensed Clinical Social Worker

## 2020-03-04 ENCOUNTER — Telehealth: Payer: Self-pay | Admitting: *Deleted

## 2020-03-04 DIAGNOSIS — F411 Generalized anxiety disorder: Secondary | ICD-10-CM

## 2020-03-04 NOTE — Telephone Encounter (Signed)
Transition Care Management Unsuccessful Follow-up Telephone Call  Date of discharge and from where:  03/03/2020 [name]  Attempts:  1st Attempt  Reason for unsuccessful TCM follow-up call:  Left voice message

## 2020-03-05 NOTE — Telephone Encounter (Signed)
Transition Care Management Unsuccessful Follow-up Telephone Call  Date of discharge and from where:  03/03/2020 - [name]  Attempts:  2nd Attempt  Reason for unsuccessful TCM follow-up call:  Left voice message

## 2020-03-08 NOTE — Telephone Encounter (Signed)
Transition Care Management Unsuccessful Follow-up Telephone Call  Date of discharge and from where:  03/03/20 from [name]  Attempts:  3rd Attempt  Reason for unsuccessful TCM follow-up call:  Unable to reach patient

## 2020-03-10 NOTE — BH Specialist Note (Signed)
Integrated Behavioral Health via Telemedicine Visit  03/04/20 Jennifer Hogan [id number]  Number of [place] visits: 2 Session Start time: 12:20 PM  Session End time: 12:50 PM Total time: 30  Referring Provider: NP Raul Del Patient location: Home [facility] Provider location: Office All persons participating in visit: LCSW and Patient Types of Service: Individual psychotherapy  I connected with Jennifer Hogan by Telephone  (Video is Caregility application) and verified that I am speaking with the correct person using two identifiers.Discussed confidentiality: Yes   I discussed the limitations of telemedicine and the availability of in person appointments.  Discussed there is a possibility of technology failure and discussed alternative modes of communication if that failure occurs.  I discussed that engaging in this telemedicine visit, they consent to the provision of behavioral healthcare and the services will be billed under their insurance.  Patient and/or legal guardian expressed understanding and consented to Telemedicine visit: Yes   Presenting Concerns: Patient and/or family reports the following symptoms/concerns: Pt experienced a stressful event and observed self returning to unhealthy thinking patterns (catastrophizing) that resulted in an increase in anxiety Duration of problem: Ongoing; Severity of problem: moderate  Patient and/or Family's Strengths/Protective Factors: Social connections, Social and Emotional competence, Concrete supports in place (healthy food, safe environments, etc.) and Sense of purpose  Goals Addressed: Patient will: 1.  Increase knowledge and/or ability of: self-management skills Pt agreed to continue self reflection strategies to promote health and positive mood  Progress towards Goals: Ongoing  Interventions: Interventions utilized:  Supportive Reflection Standardized Assessments completed: Not Needed  Patient  Response: Pt was engaged during session and was successful in supportive reflection to assist in unlearning cognitive distortions  Assessment: Patient reports ongoing symptoms of anxiety.   Patient may benefit from continued medication management and therapy.  Plan: 1. Follow up with behavioral health clinician on : 03/17/20 2. Behavioral recommendations: Utilize strategies discussed 3. Referral(s): [place] (In Clinic)  I discussed the assessment and treatment plan with the patient and/or parent/guardian. They were provided an opportunity to ask questions and all were answered. They agreed with the plan and demonstrated an understanding of the instructions.   They were advised to call back or seek an in-person evaluation if the symptoms worsen or if the condition fails to improve as anticipated.  Rebekah [place], LCSW  03/10/20 9:23 AM

## 2020-03-15 ENCOUNTER — Encounter: Payer: Self-pay | Admitting: Nurse Practitioner

## 2020-03-15 ENCOUNTER — Other Ambulatory Visit: Payer: Self-pay | Admitting: Nurse Practitioner

## 2020-03-15 MED ORDER — QVAR REDIHALER 40 MCG/ACT IN AERB: 2.0000 | INHALATION_SPRAY | Freq: Two times a day (BID) | RESPIRATORY_TRACT | 3 refills | Status: DC

## 2020-03-16 MED FILL — QVAR REDIHALER 40 MCG/ACT A: 40 | 30 days supply | Qty: 11 | Fill #0

## 2020-03-17 ENCOUNTER — Telehealth: Payer: Self-pay | Admitting: Pharmacist

## 2020-03-17 ENCOUNTER — Ambulatory Visit: Payer: Medicaid Other | Admitting: Licensed Clinical Social Worker

## 2020-03-17 ENCOUNTER — Other Ambulatory Visit: Payer: Self-pay

## 2020-03-17 NOTE — Telephone Encounter (Signed)
Called pt and LVM for pt to call back. Calling to schedule lipid labs since starting Praluent.

## 2020-03-19 ENCOUNTER — Ambulatory Visit: Payer: Medicaid Other | Admitting: Licensed Clinical Social Worker

## 2020-03-19 ENCOUNTER — Telehealth: Payer: Self-pay | Admitting: Licensed Clinical Social Worker

## 2020-03-19 NOTE — Telephone Encounter (Signed)
Call placed regarding scheduled IBH appointment. LCSW left message requesting a return call.

## 2020-03-30 NOTE — Progress Notes (Signed)
   Covid-19 Vaccination Clinic  Name:  Jennifer Hogan    MRN: [id number] DOB: 1970-09-12  03/30/2020  Ms. Chalk was observed post Covid-19 immunization for 15 minutes without incident. She was provided with Vaccine Information Sheet and instruction to access the V-Safe system.   Ms. Krinsky was instructed to call 911 with any severe reactions post vaccine: Marland Kitchen Difficulty breathing  . Swelling of face and throat  . A fast heartbeat  . A bad rash all over body  . Dizziness and weakness   Immunizations Administered    Name Date Dose VIS Date Route   Moderna Covid-19 Booster Vaccine 12/15/2019  1:30 PM 0.25 mL 11/12/2019 Intramuscular   Manufacturer: Moderna   Lot: [id number]   [place]: [id number]

## 2020-04-01 ENCOUNTER — Telehealth: Payer: Self-pay

## 2020-04-01 NOTE — Telephone Encounter (Signed)
-----   Message from Ramond Dial, [place] sent at 04/01/2020  2:42 PM EST -----  ----- Message ----- From: Ramond Dial, RPH-CPP Sent: 03/16/2020  12:00 AM EST To: Ramond Dial, RPH-CPP  Set up lipids labs

## 2020-04-01 NOTE — Telephone Encounter (Signed)
Lm to schedule labs

## 2020-04-06 ENCOUNTER — Telehealth: Payer: Self-pay

## 2020-04-06 DIAGNOSIS — E7800 Pure hypercholesterolemia, unspecified: Secondary | ICD-10-CM

## 2020-04-06 NOTE — Addendum Note (Signed)
Addended by: Marcelle Overlie D on: 04/06/2020 03:48 PM   Modules accepted: Orders

## 2020-04-06 NOTE — Telephone Encounter (Signed)
Called and lmomed the pt stating that the lipid panel was needed and to call us back to schedule

## 2020-04-08 ENCOUNTER — Other Ambulatory Visit: Payer: Self-pay | Admitting: Nurse Practitioner

## 2020-04-08 DIAGNOSIS — J9801 Acute bronchospasm: Secondary | ICD-10-CM

## 2020-04-08 MED FILL — ?LEVOTHYROXINE 100 MCG TAB: 100 | 30 days supply | Qty: 30 | Fill #3

## 2020-04-08 MED FILL — ?CLOPIDOGREL 75MG TABL: 75 | 30 days supply | Qty: 30 | Fill #6

## 2020-04-08 NOTE — Telephone Encounter (Signed)
Requested Prescriptions  Pending Prescriptions Disp Refills  . albuterol (VENTOLIN HFA) 108 (90 Base) MCG/ACT inhaler [Pharmacy Med Name: ALBUTEROL SULFATE HFA 108 ( 108 (90 BAS Aerosol] 8.5 g 0    Sig: INHALE 2 PUFFS INTO THE LUNGS EVERY 6 (SIX) HOURS AS NEEDED FOR WHEEZING OR SHORTNESS OF BREATH.     Pulmonology:  Beta Agonists Failed - 04/08/2020  8:50 AM      Failed - One inhaler should last at least one month. If the patient is requesting refills earlier, contact the patient to check for uncontrolled symptoms.      Passed - Valid encounter within last 12 months    Recent Outpatient Visits          10 months ago Cough in adult patient   [place], Vernia Buff, NP   1 year ago Anxiety and depression   [place], Vernia Buff, NP   1 year ago Gastroparesis   [place], Dionne Bucy, [place]   2 years ago Hospital discharge follow-up   [place], [place] W, NP   2 years ago Moderate episode of recurrent major depressive disorder [facility])   [place], Vernia Buff, NP

## 2020-04-09 ENCOUNTER — Other Ambulatory Visit: Payer: Medicaid Other

## 2020-04-19 ENCOUNTER — Telehealth: Payer: Self-pay

## 2020-04-19 NOTE — Telephone Encounter (Signed)
-----   Message from Ramond Dial, [place] sent at 04/19/2020  7:55 AM EDT ----- Missed her lab apt.if you could get her rescheduled please. Thanks ----- Message ----- From: Ramond Dial, RPH-CPP Sent: 04/12/2020  12:00 AM EDT To: Ramond Dial, RPH-CPP  lipids ----- Message ----- From: Allean Found, CMA Sent: 04/06/2020   3:39 PM EDT To: Ramond Dial, RPH-CPP  lmom ----- Message ----- From: Ramond Dial, RPH-CPP Sent: 04/06/2020   3:30 PM EDT To: Allean Found, CMA   ----- Message ----- From: Allean Found, CMA Sent: 04/01/2020   2:46 PM EDT To: Ramond Dial, RPH-CPP  lmom ----- Message ----- From: Ramond Dial, RPH-CPP Sent: 04/01/2020   2:42 PM EST To: Allean Found, CMA   ----- Message ----- From: Ramond Dial, RPH-CPP Sent: 03/16/2020  12:00 AM EST To: Ramond Dial, RPH-CPP  Set up lipids labs

## 2020-04-19 NOTE — Telephone Encounter (Signed)
lmom to r/s

## 2020-04-30 ENCOUNTER — Other Ambulatory Visit: Payer: Self-pay

## 2020-05-11 ENCOUNTER — Other Ambulatory Visit: Payer: Self-pay

## 2020-05-19 ENCOUNTER — Telehealth: Payer: Self-pay | Admitting: *Deleted

## 2020-05-21 ENCOUNTER — Other Ambulatory Visit: Payer: Self-pay | Admitting: Nurse Practitioner

## 2020-05-21 ENCOUNTER — Other Ambulatory Visit: Payer: Self-pay | Admitting: Endocrinology

## 2020-05-21 ENCOUNTER — Other Ambulatory Visit: Payer: Self-pay

## 2020-05-21 DIAGNOSIS — J9801 Acute bronchospasm: Secondary | ICD-10-CM

## 2020-05-21 DIAGNOSIS — E119 Type 2 diabetes mellitus without complications: Secondary | ICD-10-CM

## 2020-05-21 MED ORDER — FLUOXETINE HCL 40 MG PO CAPS
ORAL_CAPSULE | Freq: Every day | ORAL | 1 refills | Status: DC
Start: 2020-05-21 — End: 2021-05-23
  Filled 2020-05-21: qty 30, 30d supply, fill #0
  Filled 2020-07-05: qty 30, 30d supply, fill #1
  Filled 2020-09-13: qty 30, 30d supply, fill #2
  Filled 2020-10-20: qty 30, 30d supply, fill #3
  Filled 2021-01-13: qty 30, 30d supply, fill #4
  Filled 2021-03-04: qty 30, 30d supply, fill #0
  Filled 2021-03-04: qty 30, 30d supply, fill #5

## 2020-05-21 MED ORDER — ALBUTEROL SULFATE HFA 108 (90 BASE) MCG/ACT IN AERS
2.0000 | INHALATION_SPRAY | Freq: Four times a day (QID) | RESPIRATORY_TRACT | 0 refills | Status: DC | PRN
  Filled 2020-05-21: qty 8.5, 25d supply, fill #0

## 2020-05-21 MED FILL — Ondansetron Orally Disintegrating Tab 4 MG: ORAL | 7 days supply | Qty: 20 | Fill #0 | Status: AC

## 2020-05-21 MED FILL — Clopidogrel Bisulfate Tab 75 MG (Base Equiv): ORAL | 30 days supply | Qty: 30 | Fill #0 | Status: AC

## 2020-05-21 MED FILL — Montelukast Sodium Tab 10 MG (Base Equiv): ORAL | 30 days supply | Qty: 30 | Fill #0 | Status: AC

## 2020-05-21 MED FILL — Levothyroxine Sodium Tab 100 MCG: ORAL | 30 days supply | Qty: 30 | Fill #0 | Status: AC

## 2020-05-21 MED FILL — Beclomethasone Diprop HFA Breath Act Inh Aer 40 MCG/ACT: RESPIRATORY_TRACT | 30 days supply | Qty: 10.6 | Fill #0 | Status: AC

## 2020-05-21 NOTE — Telephone Encounter (Signed)
Pt called to make sure we received the escript for her humalog. Pt states she is out and is going to need it by Monday

## 2020-05-22 MED ORDER — INSULIN LISPRO 100 UNIT/ML IJ SOLN
INTRAMUSCULAR | 2 refills | Status: DC
  Filled 2020-05-22: qty 10, 10d supply, fill #0
  Filled 2020-05-31 (×2): qty 10, 28d supply, fill #0
  Filled 2020-07-05: qty 10, 28d supply, fill #1
  Filled 2020-08-27: qty 10, 28d supply, fill #2

## 2020-05-24 ENCOUNTER — Other Ambulatory Visit: Payer: Self-pay

## 2020-05-24 NOTE — Telephone Encounter (Signed)
error 

## 2020-05-27 ENCOUNTER — Other Ambulatory Visit: Payer: Self-pay

## 2020-05-31 ENCOUNTER — Telehealth: Payer: Self-pay | Admitting: Nurse Practitioner

## 2020-05-31 ENCOUNTER — Other Ambulatory Visit: Payer: Self-pay

## 2020-05-31 NOTE — Telephone Encounter (Signed)
pls FU with [employer] in regards to medical records request from 05/13/20 pls call [place]. She states you may leave her a message or ask for her but pls fu

## 2020-06-08 NOTE — Telephone Encounter (Signed)
Pt's hearing is approaching and soon and they need these records as soon as possible. Please advise

## 2020-06-28 ENCOUNTER — Other Ambulatory Visit: Payer: Self-pay

## 2020-07-05 ENCOUNTER — Other Ambulatory Visit: Payer: Self-pay | Admitting: Nurse Practitioner

## 2020-07-05 ENCOUNTER — Other Ambulatory Visit: Payer: Self-pay

## 2020-07-05 DIAGNOSIS — J9801 Acute bronchospasm: Secondary | ICD-10-CM

## 2020-07-05 MED ORDER — ALBUTEROL SULFATE HFA 108 (90 BASE) MCG/ACT IN AERS
2.0000 | INHALATION_SPRAY | Freq: Four times a day (QID) | RESPIRATORY_TRACT | 0 refills | Status: DC | PRN
  Filled 2020-07-05: qty 8.5, 25d supply, fill #0

## 2020-07-05 MED ORDER — MONTELUKAST SODIUM 10 MG PO TABS
ORAL_TABLET | Freq: Every day | ORAL | 0 refills | Status: DC
  Filled 2020-07-05: qty 16, 16d supply, fill #0

## 2020-07-05 MED FILL — Beclomethasone Diprop HFA Breath Act Inh Aer 40 MCG/ACT: RESPIRATORY_TRACT | 30 days supply | Qty: 10.6 | Fill #1 | Status: AC

## 2020-07-05 MED FILL — Clopidogrel Bisulfate Tab 75 MG (Base Equiv): ORAL | 30 days supply | Qty: 30 | Fill #1 | Status: AC

## 2020-07-05 MED FILL — Levothyroxine Sodium Tab 100 MCG: ORAL | 30 days supply | Qty: 30 | Fill #1 | Status: AC

## 2020-07-05 NOTE — Telephone Encounter (Signed)
Requested Prescriptions  Pending Prescriptions Disp Refills  . montelukast (SINGULAIR) 10 MG tablet 16 tablet 0    Sig: TAKE 1 TABLET (10 MG TOTAL) BY MOUTH AT BEDTIME.     Pulmonology:  Leukotriene Inhibitors Failed - 07/05/2020 11:24 AM      Failed - Valid encounter within last 12 months    Recent Outpatient Visits          1 year ago Cough in adult patient   [place], Vernia Buff, NP   1 year ago Anxiety and depression   [place], Vernia Buff, NP   1 year ago Gastroparesis   [place], [place]   2 years ago Hospital discharge follow-up   [place], [place] W, NP   2 years ago Moderate episode of recurrent major depressive disorder [facility])   [place], Zelda W, NP      Future Appointments            In 2 weeks [name], NP [place]           . albuterol (VENTOLIN HFA) 108 (90 Base) MCG/ACT inhaler 8.5 g 0    Sig: INHALE 2 PUFFS INTO THE LUNGS EVERY 6 (SIX) HOURS AS NEEDED FOR WHEEZING OR SHORTNESS OF BREATH.     Pulmonology:  Beta Agonists Failed - 07/05/2020 11:24 AM      Failed - One inhaler should last at least one month. If the patient is requesting refills earlier, contact the patient to check for uncontrolled symptoms.      Failed - Valid encounter within last 12 months    Recent Outpatient Visits          1 year ago Cough in adult patient   [place], Vernia Buff, NP   1 year ago Anxiety and depression   [place], Vernia Buff, NP   1 year ago Gastroparesis   [place], [place]   2 years ago Hospital discharge follow-up   [place], [place] W, NP   2 years ago Moderate  episode of recurrent major depressive disorder [facility])   [place], Zelda W, NP      Future Appointments            In 2 weeks [name], NP [place]

## 2020-07-06 ENCOUNTER — Other Ambulatory Visit: Payer: Self-pay

## 2020-07-07 ENCOUNTER — Other Ambulatory Visit: Payer: Self-pay

## 2020-07-15 ENCOUNTER — Other Ambulatory Visit: Payer: Self-pay

## 2020-07-16 ENCOUNTER — Other Ambulatory Visit: Payer: Medicaid Other

## 2020-07-20 ENCOUNTER — Encounter: Payer: Self-pay | Admitting: Nurse Practitioner

## 2020-07-21 ENCOUNTER — Ambulatory Visit: Payer: Self-pay | Attending: Nurse Practitioner | Admitting: Nurse Practitioner

## 2020-07-21 ENCOUNTER — Other Ambulatory Visit: Payer: Self-pay

## 2020-07-21 ENCOUNTER — Encounter: Payer: Self-pay | Admitting: Nurse Practitioner

## 2020-07-21 DIAGNOSIS — E785 Hyperlipidemia, unspecified: Secondary | ICD-10-CM

## 2020-07-21 DIAGNOSIS — Z1231 Encounter for screening mammogram for malignant neoplasm of breast: Secondary | ICD-10-CM

## 2020-07-21 DIAGNOSIS — Z1211 Encounter for screening for malignant neoplasm of colon: Secondary | ICD-10-CM

## 2020-07-21 DIAGNOSIS — E039 Hypothyroidism, unspecified: Secondary | ICD-10-CM

## 2020-07-21 DIAGNOSIS — Z1159 Encounter for screening for other viral diseases: Secondary | ICD-10-CM

## 2020-07-21 DIAGNOSIS — E1059 Type 1 diabetes mellitus with other circulatory complications: Secondary | ICD-10-CM

## 2020-07-21 DIAGNOSIS — R5382 Chronic fatigue, unspecified: Secondary | ICD-10-CM

## 2020-07-21 DIAGNOSIS — E559 Vitamin D deficiency, unspecified: Secondary | ICD-10-CM

## 2020-07-21 NOTE — Progress Notes (Signed)
Virtual Visit via Telephone Note Due to national recommendations of social distancing due to [place] 19, telehealth visit is felt to be most appropriate for this patient at this time.  I discussed the limitations, risks, security and privacy concerns of performing an evaluation and management service by telephone and the availability of in person appointments. I also discussed with the patient that there may be a patient responsible charge related to this service. The patient expressed understanding and agreed to proceed.    I connected with Jennifer Hogan on 07/21/20  at   3:50 PM EDT  EDT by telephone and verified that I am speaking with the correct person using two identifiers.  Location of Patient: Private Residence   Location of Provider: [place] and [employer] Office    Persons participating in Telemedicine visit: [name] FNP-BC [place]    History of Present Illness: Telemedicine visit for: Follow up She has a past medical history of Anemia, Anxiety, Coronary artery disease, Depression, Diabetes mellitus, DKA (diabetic ketoacidoses) (12/31/2017), Elevated platelet count, High cholesterol, Hypothyroidism, and Ischemic cardiomyopathy.   Fatigue: Patient complains of fatigue. Symptoms began several months ago. Sentinal symptom the patient feels fatigue began with: symptoms of arthritis and exercise intolerance and shortness of breath with mild exertion. Symptoms of her fatigue have been general malaise and hypersomnolence. Patient describes the following psychologic symptoms: none.  Patient denies fever, significant change in weight, unusual rashes, cold intolerance, constipation and change in hair texture., GI blood loss, and excessive menstrual bleeding. Symptoms have progressed to a point and plateaued. Severity has been moderate. Previous visits for this problem: yes, last seen several months ago by ER.  Chronic fatigue. Hard to get out of bed every morning.  Also with shortness of breath with mild exertion  She has been practicing more self care through meditation, medication adherence and eating healthier.     Hypothyroidism She sees endocrinology for her hypothyroidism and Type 1DM. Diabetes is not well controlled.  She does have an insulin pump with Humalog being dispensed.  LDL not at goal. Lab Results  Component Value Date   TSH 1.06 01/09/2019    Lab Results  Component Value Date   HGBA1C 9.0 (A) 02/27/2020    Lab Results  Component Value Date   LDLCALC 114 (H) 12/22/2019    Past Medical History:  Diagnosis Date   Anemia    Anxiety    Coronary artery disease    a. s/p PTCA DES x3 in the LAD (ostial DES placed, mid DES placed, distal DES placed) and PTCA/DES x1 to intermediate branch 02/2017. b. ACS 02/08/18 with LCx stent placement.   Depression    Diabetes mellitus    type 1, on insulin pump   DKA (diabetic ketoacidoses) 12/31/2017   Elevated platelet count    High cholesterol    Hypothyroidism    Ischemic cardiomyopathy    a. EF low-normal with basal inferior akinesis, basal septal hypokinesis by echo 01/2018.    Past Surgical History:  Procedure Laterality Date   APPENDECTOMY     CORONARY ARTERY BYPASS GRAFT N/A 11/04/2018   Procedure: CORONARY ARTERY BYPASS GRAFTING (CABG), ON PUMP, TIMES THREE, USING LEFT AND RIGHT INTERNAL MAMMARY ARTERIES;  Surgeon: Wonda Olds, MD;  Location: [place];  Service: Open Heart Surgery;  Laterality: N/A;   CORONARY STENT INTERVENTION N/A 03/20/2017   Procedure: DES x 3 LAD, DES OM; Surgeon: Burnell Blanks, MD;  Location: [place] [facility];  Service:  Cardiovascular;  Laterality: N/A;   CORONARY/GRAFT ACUTE MI REVASCULARIZATION N/A 02/08/2018   Procedure: Coronary/Graft Acute MI Revascularization;  Surgeon: Belva Crome, MD;  Location: [place] [facility];  Service: Cardiovascular;  Laterality: N/A;   LEFT HEART CATH AND CORONARY ANGIOGRAPHY N/A 03/20/2017   Procedure: LEFT  HEART CATH AND CORONARY ANGIOGRAPHY;  Surgeon: Burnell Blanks, MD;  Location: [place] [facility];  Service: Cardiovascular;  Laterality: N/A;   LEFT HEART CATH AND CORONARY ANGIOGRAPHY N/A 02/08/2018   Procedure: LEFT HEART CATH AND CORONARY ANGIOGRAPHY;  Surgeon: Belva Crome, MD;  Location: [place] [facility];  Service: Cardiovascular;  Laterality: N/A;   LEFT HEART CATH AND CORONARY ANGIOGRAPHY N/A 10/29/2018   Procedure: LEFT HEART CATH AND CORONARY ANGIOGRAPHY;  Surgeon: Burnell Blanks, MD;  Location: [place] [facility];  Service: Cardiovascular;  Laterality: N/A;   TEE WITHOUT CARDIOVERSION N/A 11/04/2018   Procedure: TRANSESOPHAGEAL ECHOCARDIOGRAM (TEE);  Surgeon: Wonda Olds, MD;  Location: [place];  Service: Open Heart Surgery;  Laterality: N/A;    Family History  Problem Relation Age of Onset   Coronary artery disease Father    Congestive Heart Failure Father    Asthma Father    Cancer Mother        T cell lymphoma    Social History   Socioeconomic History   Marital status: Divorced    Spouse name: Not on file   Number of children: Not on file   Years of education: Not on file   Highest education level: Not on file  Occupational History   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"  Tobacco Use   Smoking status: Never   Smokeless tobacco: Never   Tobacco comments:    Use marijuana  Vaping Use   Vaping Use: Never used  Substance and Sexual Activity   Alcohol use: No    Alcohol/week: 0.0 standard drinks   Drug use: Not Currently    Types: Marijuana    Comment: last use last week, occaisonal marijuana use   Sexual activity: Not Currently    Birth control/protection: None  Other Topics Concern   Not on file  Social History Narrative   Not on file   Social Determinants of Health   Financial Resource Strain: Not on file  Food Insecurity: Not on file  Transportation Needs: Not on file  Physical Activity: Not on file  Stress: Not on file   Social Connections: Not on file     Observations/Objective: Awake, alert and oriented x 3   Review of Systems  Constitutional:  Positive for malaise/fatigue. Negative for fever and weight loss.       SEE HPI  HENT: Negative.  Negative for nosebleeds.   Eyes: Negative.  Negative for blurred vision, double vision and photophobia.  Respiratory:  Positive for shortness of breath. Negative for cough and wheezing.   Cardiovascular: Negative.  Negative for chest pain, palpitations and leg swelling.  Gastrointestinal: Negative.  Negative for heartburn, nausea and vomiting.  Musculoskeletal: Negative.  Negative for myalgias.  Neurological: Negative.  Negative for dizziness, focal weakness, seizures and headaches.  Psychiatric/Behavioral: Negative.  Negative for suicidal ideas.    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     I discussed the assessment and treatment plan with the patient. The patient was provided an opportunity to ask questions and all were answered. The patient agreed with the  plan and demonstrated an understanding of the instructions.   The patient was advised to call back or seek an in-person evaluation if the symptoms worsen or if the condition fails to improve as anticipated.  I provided 20 minutes of non-face-to-face time during this encounter including median intraservice time, reviewing previous notes, labs, imaging, medications and explaining diagnosis and management.  [name], FNP-BC

## 2020-07-26 ENCOUNTER — Encounter: Payer: Self-pay | Admitting: Nurse Practitioner

## 2020-07-30 ENCOUNTER — Other Ambulatory Visit: Payer: Self-pay

## 2020-07-30 ENCOUNTER — Ambulatory Visit: Payer: Self-pay | Attending: Nurse Practitioner

## 2020-07-30 ENCOUNTER — Other Ambulatory Visit: Payer: Self-pay | Admitting: Nurse Practitioner

## 2020-07-30 DIAGNOSIS — Z1159 Encounter for screening for other viral diseases: Secondary | ICD-10-CM

## 2020-07-30 DIAGNOSIS — J9801 Acute bronchospasm: Secondary | ICD-10-CM

## 2020-07-30 DIAGNOSIS — R5382 Chronic fatigue, unspecified: Secondary | ICD-10-CM

## 2020-07-30 DIAGNOSIS — E039 Hypothyroidism, unspecified: Secondary | ICD-10-CM

## 2020-07-30 DIAGNOSIS — E559 Vitamin D deficiency, unspecified: Secondary | ICD-10-CM

## 2020-07-30 DIAGNOSIS — E1059 Type 1 diabetes mellitus with other circulatory complications: Secondary | ICD-10-CM

## 2020-07-30 DIAGNOSIS — E785 Hyperlipidemia, unspecified: Secondary | ICD-10-CM

## 2020-07-30 MED ORDER — MONTELUKAST SODIUM 10 MG PO TABS
ORAL_TABLET | Freq: Every day | ORAL | 0 refills | Status: DC
  Filled 2020-07-30: qty 30, 30d supply, fill #0

## 2020-07-30 NOTE — Telephone Encounter (Signed)
  Notes to clinic: One inhaler should last at least one month. If the patient is requesting refills earlier, contact the patient to check for uncontrolled symptoms   Requested Prescriptions  Pending Prescriptions Disp Refills   albuterol (VENTOLIN HFA) 108 (90 Base) MCG/ACT inhaler 8.5 g 0    Sig: INHALE 2 PUFFS INTO THE LUNGS EVERY 6 (SIX) HOURS AS NEEDED FOR WHEEZING OR SHORTNESS OF BREATH.      Pulmonology:  Beta Agonists Failed - 07/30/2020 11:48 AM      Failed - One inhaler should last at least one month. If the patient is requesting refills earlier, contact the patient to check for uncontrolled symptoms.      Passed - Valid encounter within last 12 months    Recent Outpatient Visits           1 week ago Chronic fatigue   [place], Vernia Buff, NP   1 year ago Cough in adult patient   [place], Vernia Buff, NP   1 year ago Anxiety and depression   [place], Vernia Buff, NP   1 year ago Gastroparesis   [place], Dionne [place], [place]   2 years ago Hospital discharge follow-up   [place] [name], NP

## 2020-08-02 ENCOUNTER — Other Ambulatory Visit: Payer: Self-pay

## 2020-08-02 LAB — CMP14+EGFR
ALT: 18 IU/L (ref 0–32)
AST: 30 IU/L (ref 0–40)
Albumin/Globulin Ratio: 1.4 (ref 1.2–2.2)
Albumin: 4.6 g/dL (ref 3.8–4.9)
Alkaline Phosphatase: 81 IU/L (ref 44–121)
BUN/Creatinine Ratio: 8 — ABNORMAL LOW (ref 9–23)
BUN: 8 mg/dL (ref 6–24)
Bilirubin Total: 0.3 mg/dL (ref 0.0–1.2)
CO2: 23 mmol/L (ref 20–29)
Calcium: 9.9 mg/dL (ref 8.7–10.2)
Chloride: 98 mmol/L (ref 96–106)
Creatinine, Ser: 0.98 mg/dL (ref 0.57–1.00)
Globulin, Total: 3.2 g/dL (ref 1.5–4.5)
Glucose: 223 mg/dL — ABNORMAL HIGH (ref 65–99)
Potassium: 5.2 mmol/L (ref 3.5–5.2)
Sodium: 135 mmol/L (ref 134–144)
Total Protein: 7.8 g/dL (ref 6.0–8.5)
eGFR: 70 mL/min/{1.73_m2} (ref >59)

## 2020-08-02 LAB — TSH: TSH: 1.15 u[IU]/mL (ref 0.450–4.500)

## 2020-08-02 LAB — HEMOGLOBIN A1C
Est. average glucose Bld gHb Est-mCnc: 217 mg/dL
Hgb A1c MFr Bld: 9.2 % — ABNORMAL HIGH (ref 4.8–5.6)

## 2020-08-02 LAB — LIPID PANEL
Chol/HDL Ratio: 3.7 ratio (ref 0.0–4.4)
Cholesterol, Total: 256 mg/dL — ABNORMAL HIGH (ref 100–199)
HDL: 69 mg/dL (ref >39)
LDL Chol Calc (NIH): 166 mg/dL — ABNORMAL HIGH (ref 0–99)
Triglycerides: 119 mg/dL (ref 0–149)
VLDL Cholesterol Cal: 21 mg/dL (ref 5–40)

## 2020-08-02 LAB — CBC
Hematocrit: 42.6 % (ref 34.0–46.6)
Hemoglobin: 14.2 g/dL (ref 11.1–15.9)
MCH: 31.2 pg (ref 26.6–33.0)
MCHC: 33.3 g/dL (ref 31.5–35.7)
MCV: 94 fL (ref 79–97)
Platelets: 409 10*3/uL (ref 150–450)
RBC: 4.55 x10E6/uL (ref 3.77–5.28)
RDW: 13.3 % (ref 11.7–15.4)
WBC: 6.9 10*3/uL (ref 3.4–10.8)

## 2020-08-02 LAB — VITAMIN B12: Vitamin B-12: 293 pg/mL (ref 232–1245)

## 2020-08-02 LAB — MICROALBUMIN / CREATININE URINE RATIO
Creatinine, Urine: 198.5 mg/dL
Microalb/Creat Ratio: 4 mg/g creat (ref 0–29)
Microalbumin, Urine: 7.7 ug/mL

## 2020-08-02 LAB — VITAMIN D 25 HYDROXY (VIT D DEFICIENCY, FRACTURES): Vit D, 25-Hydroxy: 32.4 ng/mL (ref 30.0–100.0)

## 2020-08-02 LAB — MAGNESIUM: Magnesium: 2.2 mg/dL (ref 1.6–2.3)

## 2020-08-02 LAB — HCV INTERPRETATION

## 2020-08-02 LAB — HCV AB W REFLEX TO QUANT PCR: HCV Ab: 0.2 s/co ratio (ref 0.0–0.9)

## 2020-08-02 MED ORDER — ALBUTEROL SULFATE HFA 108 (90 BASE) MCG/ACT IN AERS
2.0000 | INHALATION_SPRAY | Freq: Four times a day (QID) | RESPIRATORY_TRACT | 2 refills | Status: DC | PRN
  Filled 2020-08-02: qty 8.5, 25d supply, fill #0
  Filled 2020-09-13: qty 8.5, 25d supply, fill #1
  Filled 2020-10-20: qty 8.5, 25d supply, fill #2

## 2020-08-02 NOTE — Telephone Encounter (Signed)
Left message for patient to call back  

## 2020-08-05 ENCOUNTER — Other Ambulatory Visit: Payer: Self-pay

## 2020-08-13 ENCOUNTER — Other Ambulatory Visit: Payer: Self-pay

## 2020-08-13 MED FILL — Levothyroxine Sodium Tab 100 MCG: ORAL | 30 days supply | Qty: 30 | Fill #2 | Status: AC

## 2020-08-27 ENCOUNTER — Other Ambulatory Visit: Payer: Self-pay

## 2020-09-03 ENCOUNTER — Other Ambulatory Visit: Payer: Self-pay | Admitting: Nurse Practitioner

## 2020-09-03 ENCOUNTER — Other Ambulatory Visit: Payer: Self-pay

## 2020-09-03 DIAGNOSIS — J9801 Acute bronchospasm: Secondary | ICD-10-CM

## 2020-09-03 MED ORDER — MONTELUKAST SODIUM 10 MG PO TABS
ORAL_TABLET | Freq: Every day | ORAL | 0 refills | Status: DC
  Filled 2020-09-03: qty 30, 30d supply, fill #0

## 2020-09-10 IMAGING — CT CT ANGIO CHEST
2 of 7 series · 19 of 46 positions shown · IV contrast (APPLIED)
Comparison: 11/24/2018

CLINICAL DATA: Shortness of breath

EXAM:
CT ANGIOGRAPHY CHEST WITH CONTRAST
TECHNIQUE: Multidetector CT imaging of the chest was performed using the
standard protocol during bolus administration of intravenous
contrast. Multiplanar CT image reconstructions and MIPs were
obtained to evaluate the vascular anatomy.
CONTRAST:  70mL OMNIPAQUE IOHEXOL 350 MG/ML SOLN

[Series 6: thins · axial · 0.63mm/px · z∈[-294,-59]mm · 16 of 377 slices shown]
[im 21/377  lung]
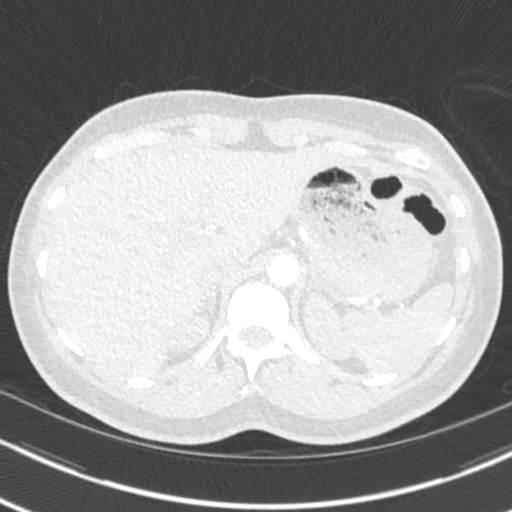
[im 42/377  soft-tissue]
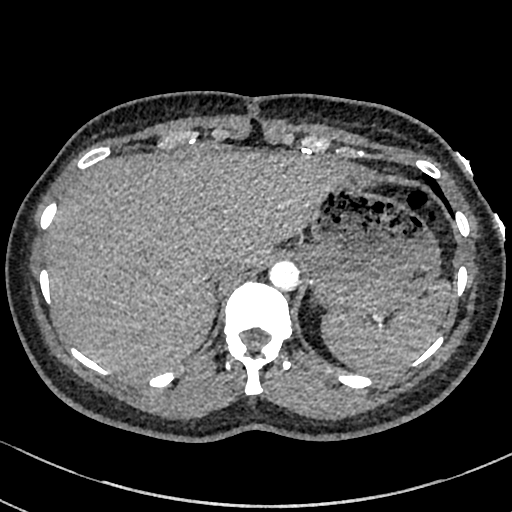
[im 63/377  lung]
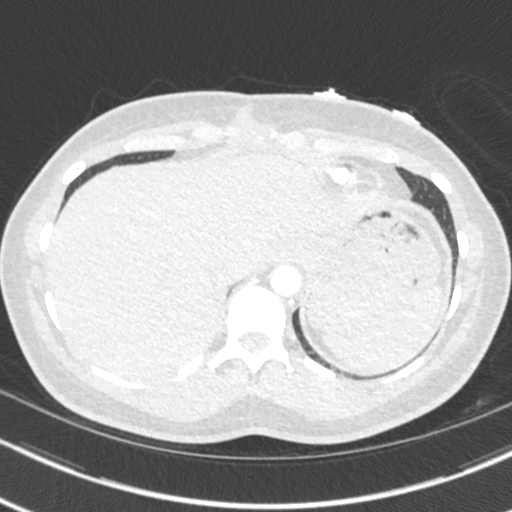
[im 84/377  soft-tissue]
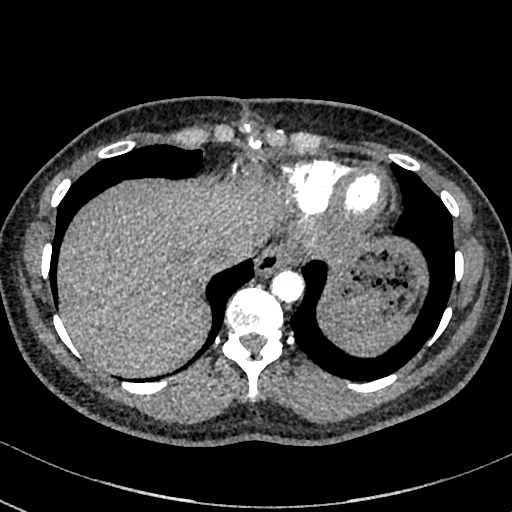
[im 105/377  lung]
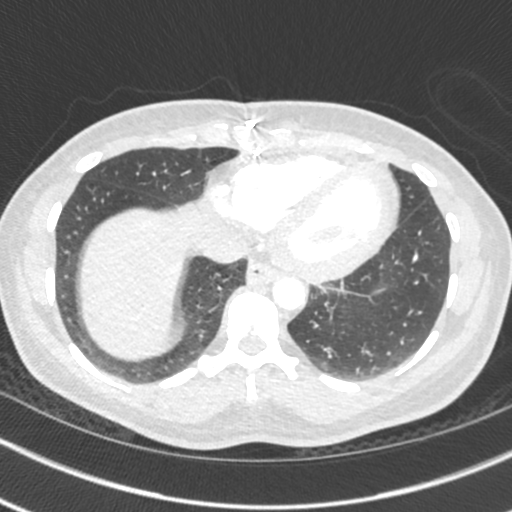
[im 126/377  soft-tissue]
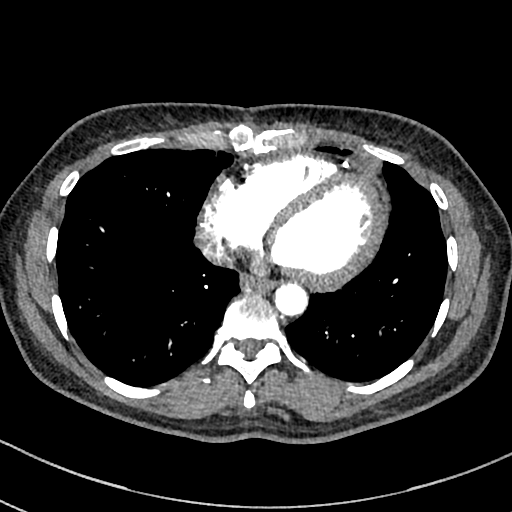
[im 147/377  lung]
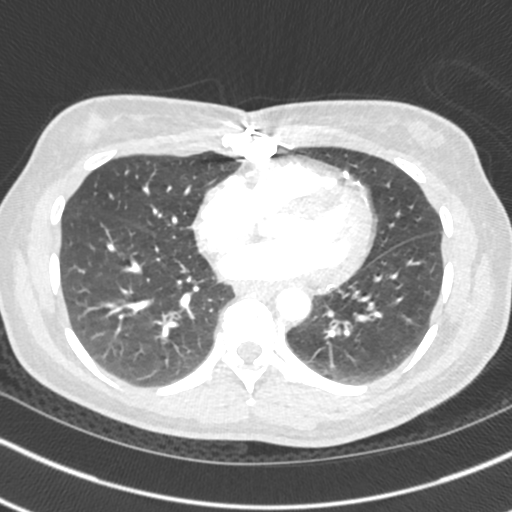
[im 168/377  soft-tissue]
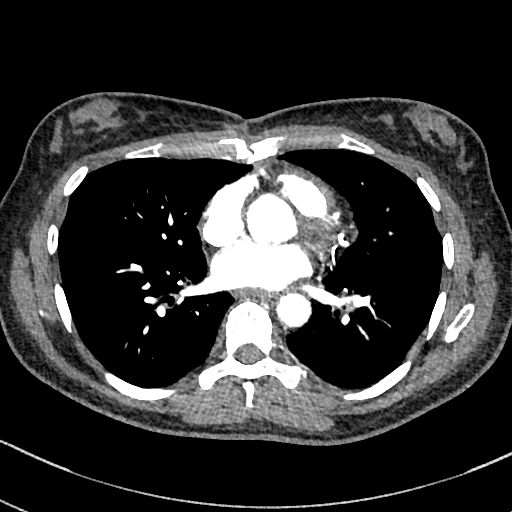
[im 209/377  lung]
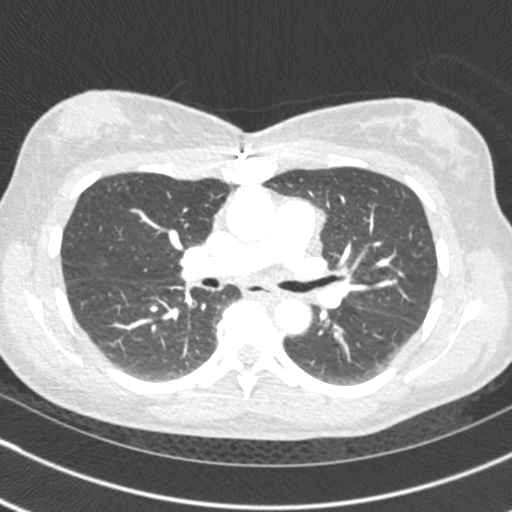
[im 230/377  soft-tissue]
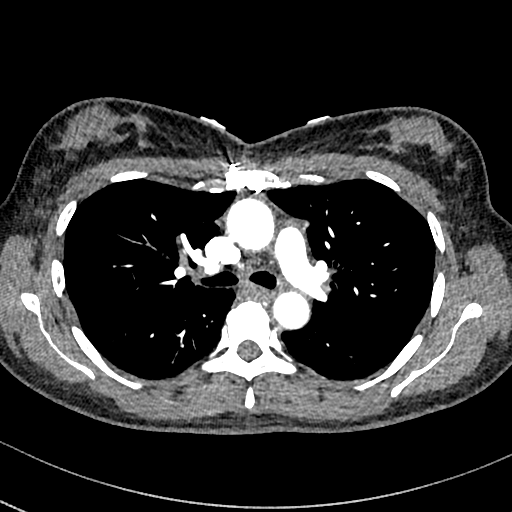
[im 251/377  lung]
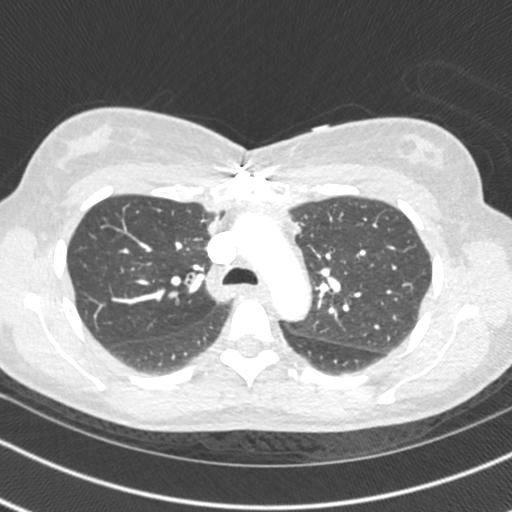
[im 272/377  soft-tissue]
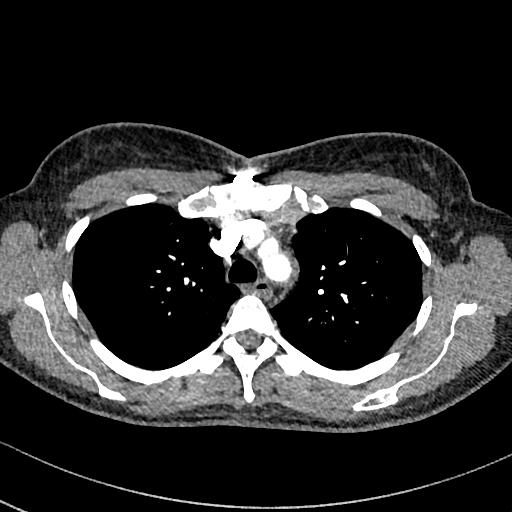
[im 293/377  lung]
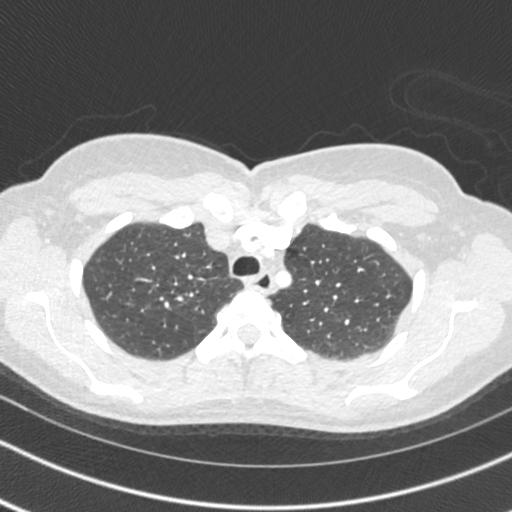
[im 314/377  soft-tissue]
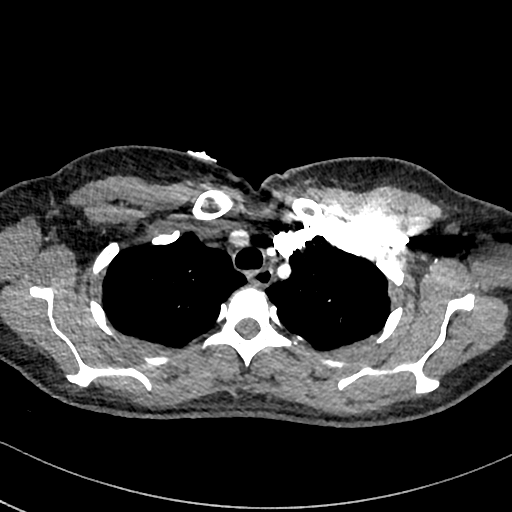
[im 335/377  lung]
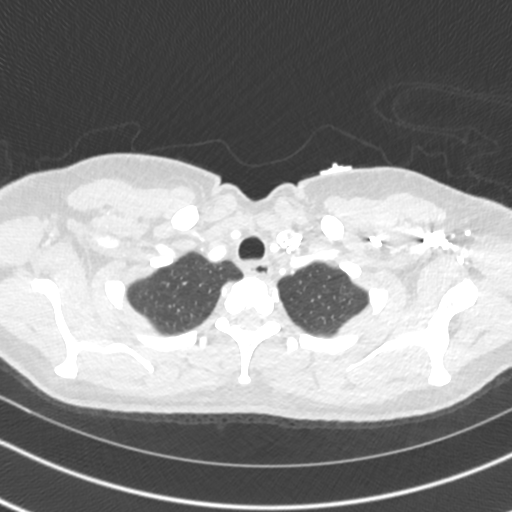
[im 356/377  soft-tissue]
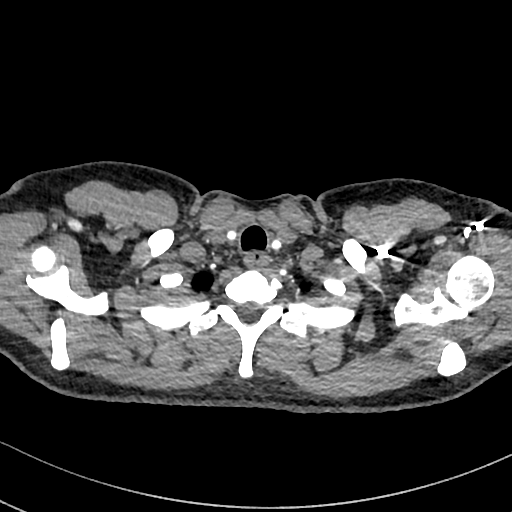

[Series 8: cor · coronal · 0.52mm/px · 3 of 115 slices shown]
[im 29/115  soft-tissue]
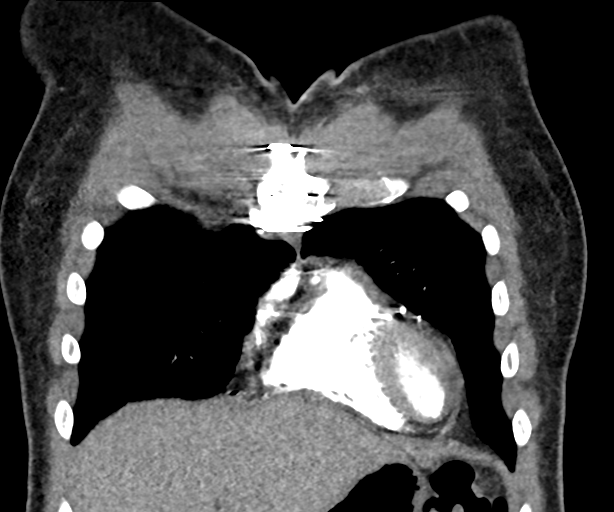
[im 58/115  soft-tissue]
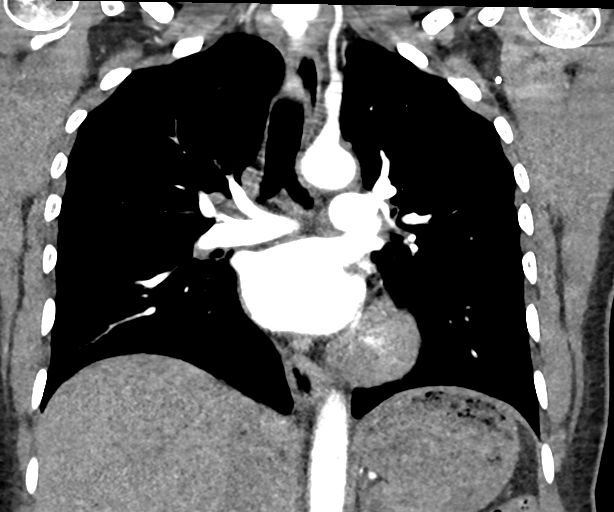
[im 86/115  soft-tissue]
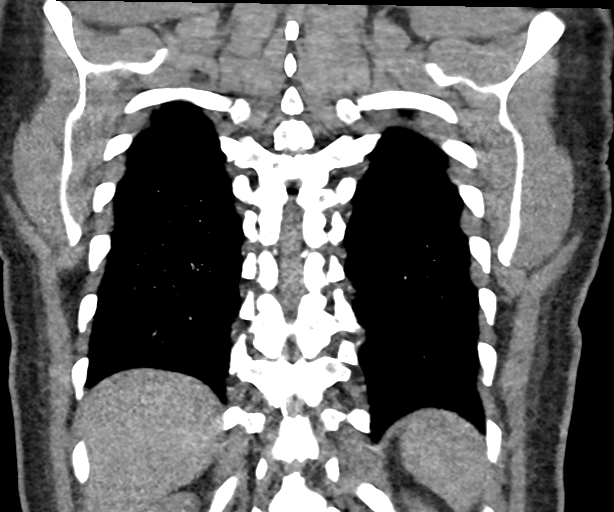

[19 of 46 positions shown; findings below may reference images not displayed]

FINDINGS: Cardiovascular: Thoracic aorta shows no aneurysmal dilatation or
dissection. Coronary calcifications are seen. Pulmonary artery shows
a normal branching pattern without filling defect to suggest
pulmonary embolism. Changes of prior coronary bypass grafting are
noted.

Mediastinum/Nodes: Esophagus is within normal limits. No sizable
hilar or mediastinal adenopathy is noted. Thoracic inlet is within
normal limits.

Lungs/Pleura: Lungs are well aerated bilaterally. Mild bronchial
thickening is seen bilaterally. This likely represents a degree of
bronchitis. No focal infiltrate or sizable effusion is seen. No
parenchymal nodule is noted.

Upper Abdomen: No acute abnormality.

Musculoskeletal: Degenerative changes of the thoracic spine are
noted. No acute bony abnormality noted.

Review of the MIP images confirms the above findings.
IMPRESSION: Changes of mild bronchitis.

No evidence of pulmonary embolism.

## 2020-09-13 ENCOUNTER — Other Ambulatory Visit: Payer: Self-pay

## 2020-09-13 MED FILL — Beclomethasone Diprop HFA Breath Act Inh Aer 40 MCG/ACT: RESPIRATORY_TRACT | 30 days supply | Qty: 10.6 | Fill #2 | Status: AC

## 2020-09-13 MED FILL — Levothyroxine Sodium Tab 100 MCG: ORAL | 30 days supply | Qty: 30 | Fill #3 | Status: AC

## 2020-09-14 ENCOUNTER — Other Ambulatory Visit: Payer: Self-pay

## 2020-09-17 ENCOUNTER — Other Ambulatory Visit: Payer: Self-pay

## 2020-09-30 ENCOUNTER — Other Ambulatory Visit: Payer: Self-pay

## 2020-09-30 ENCOUNTER — Emergency Department (HOSPITAL_COMMUNITY): Payer: Self-pay

## 2020-09-30 ENCOUNTER — Encounter (HOSPITAL_COMMUNITY): Payer: Self-pay | Admitting: Internal Medicine

## 2020-09-30 ENCOUNTER — Inpatient Hospital Stay (HOSPITAL_COMMUNITY)
Admission: EM | Admit: 2020-09-30 | Discharge: 2020-10-06 | DRG: 919 | Disposition: A | Discharge status: Home / Self Care | Payer: Self-pay | Attending: Internal Medicine | Admitting: Internal Medicine

## 2020-09-30 DIAGNOSIS — I5181 Takotsubo syndrome: Secondary | ICD-10-CM

## 2020-09-30 DIAGNOSIS — I214 Non-ST elevation (NSTEMI) myocardial infarction: Secondary | ICD-10-CM

## 2020-09-30 DIAGNOSIS — I2582 Chronic total occlusion of coronary artery: Secondary | ICD-10-CM | POA: Diagnosis present

## 2020-09-30 DIAGNOSIS — Z79890 Hormone replacement therapy (postmenopausal): Secondary | ICD-10-CM

## 2020-09-30 DIAGNOSIS — Z794 Long term (current) use of insulin: Secondary | ICD-10-CM

## 2020-09-30 DIAGNOSIS — A419 Sepsis, unspecified organism: Secondary | ICD-10-CM

## 2020-09-30 DIAGNOSIS — R7881 Bacteremia: Secondary | ICD-10-CM | POA: Diagnosis present

## 2020-09-30 DIAGNOSIS — I25110 Atherosclerotic heart disease of native coronary artery with unstable angina pectoris: Secondary | ICD-10-CM | POA: Diagnosis present

## 2020-09-30 DIAGNOSIS — I21A1 Myocardial infarction type 2: Secondary | ICD-10-CM | POA: Diagnosis present

## 2020-09-30 DIAGNOSIS — Z79899 Other long term (current) drug therapy: Secondary | ICD-10-CM

## 2020-09-30 DIAGNOSIS — E039 Hypothyroidism, unspecified: Secondary | ICD-10-CM | POA: Diagnosis present

## 2020-09-30 DIAGNOSIS — T82855A Stenosis of coronary artery stent, initial encounter: Secondary | ICD-10-CM | POA: Diagnosis present

## 2020-09-30 DIAGNOSIS — I5021 Acute systolic (congestive) heart failure: Secondary | ICD-10-CM

## 2020-09-30 DIAGNOSIS — Z8679 Personal history of other diseases of the circulatory system: Secondary | ICD-10-CM

## 2020-09-30 DIAGNOSIS — E1059 Type 1 diabetes mellitus with other circulatory complications: Secondary | ICD-10-CM

## 2020-09-30 DIAGNOSIS — Y742 Prosthetic and other implants, materials and accessory general hospital and personal-use devices associated with adverse incidents: Secondary | ICD-10-CM | POA: Diagnosis present

## 2020-09-30 DIAGNOSIS — Z634 Disappearance and death of family member: Secondary | ICD-10-CM

## 2020-09-30 DIAGNOSIS — Z7982 Long term (current) use of aspirin: Secondary | ICD-10-CM

## 2020-09-30 DIAGNOSIS — Z8249 Family history of ischemic heart disease and other diseases of the circulatory system: Secondary | ICD-10-CM

## 2020-09-30 DIAGNOSIS — F32A Depression, unspecified: Secondary | ICD-10-CM | POA: Diagnosis present

## 2020-09-30 DIAGNOSIS — I110 Hypertensive heart disease with heart failure: Secondary | ICD-10-CM | POA: Diagnosis present

## 2020-09-30 DIAGNOSIS — Z888 Allergy status to other drugs, medicaments and biological substances status: Secondary | ICD-10-CM

## 2020-09-30 DIAGNOSIS — J189 Pneumonia, unspecified organism: Secondary | ICD-10-CM | POA: Insufficient documentation

## 2020-09-30 DIAGNOSIS — R0600 Dyspnea, unspecified: Secondary | ICD-10-CM

## 2020-09-30 DIAGNOSIS — E7800 Pure hypercholesterolemia, unspecified: Secondary | ICD-10-CM | POA: Diagnosis present

## 2020-09-30 DIAGNOSIS — Z807 Family history of other malignant neoplasms of lymphoid, hematopoietic and related tissues: Secondary | ICD-10-CM

## 2020-09-30 DIAGNOSIS — I420 Dilated cardiomyopathy: Secondary | ICD-10-CM

## 2020-09-30 DIAGNOSIS — Z7902 Long term (current) use of antithrombotics/antiplatelets: Secondary | ICD-10-CM

## 2020-09-30 DIAGNOSIS — D649 Anemia, unspecified: Secondary | ICD-10-CM | POA: Diagnosis present

## 2020-09-30 DIAGNOSIS — B954 Other streptococcus as the cause of diseases classified elsewhere: Secondary | ICD-10-CM | POA: Diagnosis present

## 2020-09-30 DIAGNOSIS — Z951 Presence of aortocoronary bypass graft: Secondary | ICD-10-CM

## 2020-09-30 DIAGNOSIS — E1110 Type 2 diabetes mellitus with ketoacidosis without coma: Secondary | ICD-10-CM | POA: Diagnosis present

## 2020-09-30 DIAGNOSIS — T85614A Breakdown (mechanical) of insulin pump, initial encounter: Principal | ICD-10-CM | POA: Diagnosis present

## 2020-09-30 DIAGNOSIS — N179 Acute kidney failure, unspecified: Secondary | ICD-10-CM | POA: Diagnosis present

## 2020-09-30 DIAGNOSIS — R700 Elevated erythrocyte sedimentation rate: Secondary | ICD-10-CM | POA: Diagnosis not present

## 2020-09-30 DIAGNOSIS — E1010 Type 1 diabetes mellitus with ketoacidosis without coma: Secondary | ICD-10-CM | POA: Diagnosis present

## 2020-09-30 DIAGNOSIS — J9601 Acute respiratory failure with hypoxia: Secondary | ICD-10-CM | POA: Diagnosis not present

## 2020-09-30 DIAGNOSIS — F419 Anxiety disorder, unspecified: Secondary | ICD-10-CM | POA: Diagnosis present

## 2020-09-30 DIAGNOSIS — T383X6A Underdosing of insulin and oral hypoglycemic [antidiabetic] drugs, initial encounter: Secondary | ICD-10-CM | POA: Diagnosis present

## 2020-09-30 DIAGNOSIS — Y831 Surgical operation with implant of artificial internal device as the cause of abnormal reaction of the patient, or of later complication, without mention of misadventure at the time of the procedure: Secondary | ICD-10-CM | POA: Diagnosis present

## 2020-09-30 DIAGNOSIS — I252 Old myocardial infarction: Secondary | ICD-10-CM

## 2020-09-30 DIAGNOSIS — Z825 Family history of asthma and other chronic lower respiratory diseases: Secondary | ICD-10-CM

## 2020-09-30 DIAGNOSIS — Z881 Allergy status to other antibiotic agents status: Secondary | ICD-10-CM

## 2020-09-30 DIAGNOSIS — Z20822 Contact with and (suspected) exposure to covid-19: Secondary | ICD-10-CM | POA: Diagnosis present

## 2020-09-30 DIAGNOSIS — I444 Left anterior fascicular block: Secondary | ICD-10-CM | POA: Diagnosis present

## 2020-09-30 DIAGNOSIS — I472 Ventricular tachycardia: Secondary | ICD-10-CM | POA: Diagnosis not present

## 2020-09-30 DIAGNOSIS — I5043 Acute on chronic combined systolic (congestive) and diastolic (congestive) heart failure: Secondary | ICD-10-CM | POA: Diagnosis not present

## 2020-09-30 DIAGNOSIS — I255 Ischemic cardiomyopathy: Secondary | ICD-10-CM | POA: Diagnosis present

## 2020-09-30 LAB — CBC
HCT: 37.6 % (ref 36.0–46.0)
Hemoglobin: 12.4 g/dL (ref 12.0–15.0)
MCH: 31.5 pg (ref 26.0–34.0)
MCHC: 33 g/dL (ref 30.0–36.0)
MCV: 95.4 fL (ref 80.0–100.0)
Platelets: 455 10*3/uL — ABNORMAL HIGH (ref 150–400)
RBC: 3.94 MIL/uL (ref 3.87–5.11)
RDW: 13.4 % (ref 11.5–15.5)
WBC: 20.1 10*3/uL — ABNORMAL HIGH (ref 4.0–10.5)
nRBC: 0 % (ref 0.0–0.2)

## 2020-09-30 LAB — CBG MONITORING, ED
Glucose-Capillary: 509 mg/dL (ref 70–99)
Glucose-Capillary: >600 mg/dL (ref 70–99)
Glucose-Capillary: 551 mg/dL (ref 70–99)
Glucose-Capillary: 454 mg/dL — ABNORMAL HIGH (ref 70–99)

## 2020-09-30 LAB — I-STAT CHEM 8, ED
BUN: 32 mg/dL — ABNORMAL HIGH (ref 6–20)
Calcium, Ion: 1.07 mmol/L — ABNORMAL LOW (ref 1.15–1.40)
Chloride: 104 mmol/L (ref 98–111)
Creatinine, Ser: 1.9 mg/dL — ABNORMAL HIGH (ref 0.44–1.00)
Glucose, Bld: >700 mg/dL (ref 70–99)
HCT: 39 % (ref 36.0–46.0)
Hemoglobin: 13.3 g/dL (ref 12.0–15.0)
Potassium: 6.1 mmol/L — ABNORMAL HIGH (ref 3.5–5.1)
Sodium: 126 mmol/L — ABNORMAL LOW (ref 135–145)
TCO2: 9 mmol/L — ABNORMAL LOW (ref 22–32)

## 2020-09-30 LAB — I-STAT BETA HCG BLOOD, ED (MC, WL, AP ONLY): I-stat hCG, quantitative: <5 m[IU]/mL (ref <5)

## 2020-09-30 LAB — LIPASE, BLOOD: Lipase: 19 U/L (ref 11–51)

## 2020-09-30 LAB — TROPONIN I (HIGH SENSITIVITY)
Troponin I (High Sensitivity): 61 ng/L — ABNORMAL HIGH (ref <18)
Troponin I (High Sensitivity): 54 ng/L — ABNORMAL HIGH (ref <18)

## 2020-09-30 LAB — BASIC METABOLIC PANEL
Anion gap: 22 — ABNORMAL HIGH (ref 5–15)
BUN: 21 mg/dL — ABNORMAL HIGH (ref 6–20)
CO2: 10 mmol/L — ABNORMAL LOW (ref 22–32)
Calcium: 9.5 mg/dL (ref 8.9–10.3)
Chloride: 98 mmol/L (ref 98–111)
Creatinine, Ser: 1.67 mg/dL — ABNORMAL HIGH (ref 0.44–1.00)
GFR, Estimated: 37 mL/min — ABNORMAL LOW (ref >60)
Glucose, Bld: 550 mg/dL (ref 70–99)
Potassium: 4.8 mmol/L (ref 3.5–5.1)
Sodium: 130 mmol/L — ABNORMAL LOW (ref 135–145)

## 2020-09-30 LAB — RESP PANEL BY RT-PCR (FLU A&B, COVID) ARPGX2
Influenza A by PCR: NEGATIVE
Influenza B by PCR: NEGATIVE
SARS Coronavirus 2 by RT PCR: NEGATIVE

## 2020-09-30 LAB — I-STAT VENOUS BLOOD GAS, ED
Acid-base deficit: 20 mmol/L — ABNORMAL HIGH (ref 0.0–2.0)
Bicarbonate: 6.6 mmol/L — ABNORMAL LOW (ref 20.0–28.0)
Calcium, Ion: 1.05 mmol/L — ABNORMAL LOW (ref 1.15–1.40)
HCT: 39 % (ref 36.0–46.0)
Hemoglobin: 13.3 g/dL (ref 12.0–15.0)
O2 Saturation: 88 %
Potassium: 6.1 mmol/L — ABNORMAL HIGH (ref 3.5–5.1)
Sodium: 126 mmol/L — ABNORMAL LOW (ref 135–145)
TCO2: 7 mmol/L — ABNORMAL LOW (ref 22–32)
pCO2, Ven: 19.2 mmHg — CL (ref 44.0–60.0)
pH, Ven: 7.145 — CL (ref 7.250–7.430)
pO2, Ven: 69 mmHg — ABNORMAL HIGH (ref 32.0–45.0)

## 2020-09-30 LAB — LACTIC ACID, PLASMA: Lactic Acid, Venous: 6.3 mmol/L (ref 0.5–1.9)

## 2020-09-30 MED ORDER — SODIUM CHLORIDE 0.9 % IV SOLN
500.0000 mg | INTRAVENOUS | Status: DC
  Administered 2020-10-01: 500 mg via INTRAVENOUS
  Filled 2020-09-30 (×2): qty 500

## 2020-09-30 MED ORDER — ACETAMINOPHEN 325 MG PO TABS
650.0000 mg | ORAL_TABLET | Freq: Once | ORAL | Status: AC
  Administered 2020-09-30: 650 mg via ORAL
  Filled 2020-09-30: qty 2

## 2020-09-30 MED ORDER — ONDANSETRON HCL 4 MG/2ML IJ SOLN: 4.0000 mg | Freq: Once | INTRAMUSCULAR | Status: DC

## 2020-09-30 MED ORDER — LACTATED RINGERS IV SOLN: INTRAVENOUS | Status: DC

## 2020-09-30 MED ORDER — SODIUM CHLORIDE 0.9 % IV SOLN
500.0000 mg | Freq: Once | INTRAVENOUS | Status: AC
  Administered 2020-09-30: 500 mg via INTRAVENOUS
  Filled 2020-09-30: qty 500

## 2020-09-30 MED ORDER — DEXTROSE 50 % IV SOLN: 0.0000 mL | INTRAVENOUS | Status: DC | PRN

## 2020-09-30 MED ORDER — POTASSIUM CHLORIDE 10 MEQ/100ML IV SOLN
10.0000 meq | INTRAVENOUS | Status: AC
  Administered 2020-09-30: 10 meq via INTRAVENOUS
  Filled 2020-09-30: qty 100

## 2020-09-30 MED ORDER — CLOPIDOGREL BISULFATE 75 MG PO TABS
75.0000 mg | ORAL_TABLET | Freq: Every day | ORAL | Status: DC
  Administered 2020-10-01 – 2020-10-06 (×6): 75 mg via ORAL
  Filled 2020-09-30 (×6): qty 1

## 2020-09-30 MED ORDER — INSULIN REGULAR(HUMAN) IN NACL 100-0.9 UT/100ML-% IV SOLN
INTRAVENOUS | Status: DC
  Administered 2020-10-01: 6 [IU]/h via INTRAVENOUS
  Administered 2020-10-01: 2 [IU]/h via INTRAVENOUS
  Filled 2020-09-30: qty 100

## 2020-09-30 MED ORDER — LEVOTHYROXINE SODIUM 100 MCG PO TABS
100.0000 ug | ORAL_TABLET | Freq: Every day | ORAL | Status: DC
  Administered 2020-10-02 – 2020-10-06 (×5): 100 ug via ORAL
  Filled 2020-09-30 (×5): qty 1

## 2020-09-30 MED ORDER — ASPIRIN EC 81 MG PO TBEC
81.0000 mg | DELAYED_RELEASE_TABLET | Freq: Every day | ORAL | Status: DC
  Administered 2020-10-01 – 2020-10-06 (×6): 81 mg via ORAL
  Filled 2020-09-30 (×6): qty 1

## 2020-09-30 MED ORDER — SODIUM CHLORIDE 0.9 % IV BOLUS
1000.0000 mL | Freq: Once | INTRAVENOUS | Status: AC
  Administered 2020-09-30: 1000 mL via INTRAVENOUS

## 2020-09-30 MED ORDER — CEFTRIAXONE SODIUM 1 G IJ SOLR
1.0000 g | Freq: Once | INTRAMUSCULAR | Status: DC
Start: 2020-09-30 — End: 2020-10-01

## 2020-09-30 MED ORDER — IOHEXOL 350 MG/ML SOLN
100.0000 mL | Freq: Once | INTRAVENOUS | Status: AC | PRN
  Administered 2020-09-30: 100 mL via INTRAVENOUS

## 2020-09-30 MED ORDER — INSULIN REGULAR(HUMAN) IN NACL 100-0.9 UT/100ML-% IV SOLN
INTRAVENOUS | Status: DC
  Administered 2020-09-30: 6 [IU]/h via INTRAVENOUS
  Filled 2020-09-30: qty 100

## 2020-09-30 MED ORDER — INSULIN ASPART 100 UNIT/ML IJ SOLN
5.0000 [IU] | Freq: Once | INTRAMUSCULAR | Status: AC
  Administered 2020-09-30: 5 [IU] via SUBCUTANEOUS

## 2020-09-30 MED ORDER — SODIUM CHLORIDE 0.9 % IV SOLN
2.0000 g | INTRAVENOUS | Status: DC
  Administered 2020-10-01 – 2020-10-02 (×2): 2 g via INTRAVENOUS
  Filled 2020-09-30 (×2): qty 20

## 2020-09-30 MED ORDER — SODIUM CHLORIDE 0.9 % IV SOLN
1.0000 g | Freq: Once | INTRAVENOUS | Status: AC
  Administered 2020-09-30: 1 g via INTRAVENOUS
  Filled 2020-09-30: qty 10

## 2020-09-30 MED ORDER — DEXTROSE IN LACTATED RINGERS 5 % IV SOLN
INTRAVENOUS | Status: DC
  Administered 2020-10-02: 1000 mL via INTRAVENOUS

## 2020-09-30 MED ORDER — ENOXAPARIN SODIUM 40 MG/0.4ML IJ SOSY
40.0000 mg | PREFILLED_SYRINGE | INTRAMUSCULAR | Status: DC
  Administered 2020-10-01: 40 mg via SUBCUTANEOUS
  Filled 2020-09-30: qty 0.4

## 2020-09-30 MED ORDER — SODIUM CHLORIDE 0.9 % IV BOLUS
1000.0000 mL | Freq: Once | INTRAVENOUS | Status: DC
Start: 2020-09-30 — End: 2020-10-06

## 2020-09-30 MED ORDER — DEXTROSE IN LACTATED RINGERS 5 % IV SOLN: INTRAVENOUS | Status: DC

## 2020-09-30 MED ORDER — LACTATED RINGERS IV BOLUS: 20.0000 mL/kg | Freq: Once | INTRAVENOUS | Status: DC

## 2020-09-30 MED ORDER — SODIUM CHLORIDE 0.9 % IV SOLN: 500.0000 mg | Freq: Once | INTRAVENOUS | Status: DC

## 2020-09-30 NOTE — ED Provider Notes (Signed)
[facility] EMERGENCY DEPARTMENT Provider Note   CSN: [id number] Arrival date & time: 09/30/20  1633     History Chief Complaint  Patient presents with   Tachycardia   Shoulder Pain   Back Pain    Jennifer Hogan is a 50 y.o. female history of CAD, type 1 diabetes with insulin pump, here presenting with abdominal pain and chest pain and cough.  States that for the last 2 to 3 days she has been having nausea and epigastric pain. Patient states that she has been very sweaty and her insulin pump became detached today.  She states that her sugar has been persistently running high.  She has nonproductive cough as well.  Patient has some subjective chills as well.   The history is provided by the patient.      Past Medical History:  Diagnosis Date   Anemia    Anxiety    Coronary artery disease    a. s/p PTCA DES x3 in the LAD (ostial DES placed, mid DES placed, distal DES placed) and PTCA/DES x1 to intermediate branch 02/2017. b. ACS 02/08/18 with LCx stent placement.   Depression    Diabetes mellitus    type 1, on insulin pump   DKA (diabetic ketoacidoses) 12/31/2017   Elevated platelet count    High cholesterol    Hypothyroidism    Ischemic cardiomyopathy    a. EF low-normal with basal inferior akinesis, basal septal hypokinesis by echo 01/2018.    Patient Active Problem List   Diagnosis Date Noted   Emesis 05/01/2019   AKI (acute kidney injury) ([place]) 05/01/2019   Severe hyperglycemia due to diabetes mellitus ([place]) 03/15/2019   Pleural effusion 12/09/2018   Abdominal pain 11/28/2018   Intractable nausea and vomiting 11/28/2018   Diarrhea 11/28/2018   DKA (diabetic ketoacidoses) 11/25/2018   Sinus tachycardia [id number]   Chronic systolic heart failure ([place])    Cardiomyopathy (HCC)    Hypotension 10/29/2018   DKA, type 1 ([place]) 05/24/2018   Moderate episode of recurrent major depressive disorder ([place]) 03/27/2018   Epigastric pain    Hyperglycemia due  to type 1 diabetes mellitus ([place]) 02/14/2018   Hyperlipidemia LDL goal <70 02/14/2018   S/P drug eluting coronary stent placement    ACS (acute coronary syndrome) ([place]) 02/08/2018   ST elevation myocardial infarction (STEMI) ([place]) 02/08/2018   DKA (diabetic ketoacidosis) ([place]) 12/31/2017   Marijuana abuse 12/31/2017   Gastroparesis 12/06/2017   Gastroparesis due to DM ([place]) 03/28/2017   Coronary artery disease 03/28/2017   Ischemic cardiomyopathy 03/28/2017   Pure hypercholesterolemia    NSTEMI (non-ST elevated myocardial infarction) ([place])    Anemia 03/17/2017   Allergic state 08/07/2015   Vitiligo 07/09/2012   Type 1 diabetes mellitus ([place]) 07/09/2012   Depression 12/04/2010   Non-intractable vomiting with nausea 12/04/2010   Hypothyroidism 12/04/2010    Past Surgical History:  Procedure Laterality Date   APPENDECTOMY     CORONARY ARTERY BYPASS GRAFT N/A 11/04/2018   Procedure: CORONARY ARTERY BYPASS GRAFTING (CABG), ON PUMP, TIMES THREE, USING LEFT AND RIGHT INTERNAL MAMMARY ARTERIES;  Surgeon: Wonda Olds, MD;  Location: [facility];  Service: Open Heart Surgery;  Laterality: N/A;   CORONARY STENT INTERVENTION N/A 03/20/2017   Procedure: DES x 3 LAD, DES OM; Surgeon: Burnell Blanks, MD;  Location: [facility];  Service: Cardiovascular;  Laterality: N/A;   CORONARY/GRAFT ACUTE MI REVASCULARIZATION N/A 02/08/2018   Procedure: Coronary/Graft Acute MI Revascularization;  Surgeon:  Belva Crome, MD;  Location: [facility];  Service: Cardiovascular;  Laterality: N/A;   LEFT HEART CATH AND CORONARY ANGIOGRAPHY N/A 03/20/2017   Procedure: LEFT HEART CATH AND CORONARY ANGIOGRAPHY;  Surgeon: Burnell Blanks, MD;  Location: [facility];  Service: Cardiovascular;  Laterality: N/A;   LEFT HEART CATH AND CORONARY ANGIOGRAPHY N/A 02/08/2018   Procedure: LEFT HEART CATH AND CORONARY ANGIOGRAPHY;  Surgeon: Belva Crome, MD;  Location: [facility];   Service: Cardiovascular;  Laterality: N/A;   LEFT HEART CATH AND CORONARY ANGIOGRAPHY N/A 10/29/2018   Procedure: LEFT HEART CATH AND CORONARY ANGIOGRAPHY;  Surgeon: Burnell Blanks, MD;  Location: [facility];  Service: Cardiovascular;  Laterality: N/A;   TEE WITHOUT CARDIOVERSION N/A 11/04/2018   Procedure: TRANSESOPHAGEAL ECHOCARDIOGRAM (TEE);  Surgeon: Wonda Olds, MD;  Location: [facility];  Service: Open Heart Surgery;  Laterality: N/A;     OB History     Gravida  0   Para  0   Term  0   Preterm  0   AB  0   Living  0      SAB  0   IAB  0   Ectopic  0   Multiple  0   Live Births  0           Family History  Problem Relation Age of Onset   Coronary artery disease Father    Congestive Heart Failure Father    Asthma Father    Cancer Mother        T cell lymphoma    Social History   Tobacco Use   Smoking status: Never   Smokeless tobacco: Never   Tobacco comments:    Use marijuana  Vaping Use   Vaping Use: Never used  Substance Use Topics   Alcohol use: No    Alcohol/week: 0.0 standard drinks   Drug use: Not Currently    Types: Marijuana    Comment: last use last week, occaisonal marijuana use    Home Medications Prior to Admission medications   Medication Sig Start Date End Date Taking? Authorizing Provider  acetaminophen (TYLENOL) 325 MG tablet Take 650 mg by mouth daily as needed (couch/pain/fever).    [provider]  albuterol (VENTOLIN HFA) 108 (90 Base) MCG/ACT inhaler INHALE 2 PUFFS INTO THE LUNGS EVERY 6 (SIX) HOURS AS NEEDED FOR WHEEZING OR SHORTNESS OF BREATH. 08/02/20 08/02/21  Charlott Rakes, MD  Alirocumab (PRALUENT) 150 MG/ML SOAJ Inject 150 mg into the skin every 14 (fourteen) days. 12/23/19   [name], MD  Alpha-Lipoic Acid 100 MG TABS Take 100-200 tablets by mouth daily.    [provider]  aspirin 81 MG EC tablet Take 1 tablet (81 mg total) by mouth daily. Patient taking differently:  Take 81 mg by mouth at bedtime. 12/07/17   Amin, Jeanella Flattery, MD  b complex vitamins tablet Take 1 tablet by mouth daily.    [provider]  beclomethasone (QVAR) 40 MCG/ACT inhaler INHALE 2 PUFFS INTO THE LUNGS 2 (TWO) TIMES DAILY. 03/15/20 03/15/21  [name], NP  benzonatate (TESSALON) 200 MG capsule Take 1 capsule (200 mg total) by mouth 2 (two) times daily as needed for cough. 06/03/19   [name], NP  clopidogrel (PLAVIX) 75 MG tablet TAKE 1 TABLET BY MOUTH DAILY WITH BREAKFAST. 08/25/19   [name], MD  diclofenac Sodium (VOLTAREN) 1 % GEL Apply 2 g  topically 4 (four) times daily. Patient taking differently: Apply 2 g topically daily as needed (For pain). 03/19/19   [name], NP  ferrous sulfate 325 (65 FE) MG tablet Take 325 mg by mouth See admin instructions. Take one tablet (325 mg) by mouth once or twice during menstrual cycle    [provider]  FLUoxetine (PROZAC) 40 MG capsule TAKE 1 CAPSULE BY MOUTH DAILY. 05/21/20 05/21/21  [name], NP  fluticasone (FLONASE) 50 MCG/ACT nasal spray Place 1 spray into both nostrils at bedtime as needed for allergies.    [provider]  hydrOXYzine (ATARAX/VISTARIL) 25 MG tablet Take 1 tablet (25 mg total) by mouth 3 (three) times daily as needed. Patient taking differently: Take 25 mg by mouth 3 (three) times daily as needed for anxiety. 03/19/19   [name], NP  Insulin Human (INSULIN PUMP) SOLN Inject into the skin continuous. Humalog    [provider]  insulin lispro (HUMALOG) 100 UNIT/ML injection Inject 35 units into the skin daily via pump 05/22/20 05/22/21  Renato Shin, MD  ipratropium (ATROVENT) 0.06 % nasal spray Place 2 sprays into both nostrils 2 (two) times daily as needed for rhinitis.    [provider]  levothyroxine (SYNTHROID) 100 MCG tablet TAKE 1 TABLET (100 MCG TOTAL) BY MOUTH DAILY BEFORE BREAKFAST. 11/17/19 11/16/20  Renato Shin, MD  LORazepam  (ATIVAN) 0.5 MG tablet Take 1 tablet (0.5 mg total) by mouth every 8 (eight) hours as needed for anxiety. 07/13/19   Ripley Fraise, MD  meloxicam (MOBIC) 15 MG tablet Take 15 mg by mouth daily as needed for pain.    [provider]  metoCLOPramide (REGLAN) 5 MG tablet Take 1 tablet (5 mg total) by mouth every 12 (twelve) hours as needed for nausea or vomiting. 12/17/18 12/17/19  Argentina Donovan, PA-C  montelukast (SINGULAIR) 10 MG tablet TAKE 1 TABLET (10 MG TOTAL) BY MOUTH AT BEDTIME. 09/03/20 09/03/21  [name], NP  nitroGLYCERIN (NITROSTAT) 0.4 MG SL tablet Place 1 tablet (0.4 mg total) under the tongue every 5 (five) minutes as needed for chest pain. 01/08/19   [name], MD  ondansetron (ZOFRAN-ODT) 4 MG disintegrating tablet TAKE 1 TABLET (4 MG TOTAL) BY MOUTH EVERY 8 (EIGHT) HOURS AS NEEDED FOR NAUSEA OR VOMITING. 02/27/20 02/26/21  [name], NP  pantoprazole (PROTONIX) 40 MG tablet Take 1 tablet (40 mg total) by mouth 2 (two) times daily as needed (acid reflux). Patient taking differently: Take 40 mg by mouth as needed. Pt taking as needed for indigestion/acid reflux. 06/03/19 08/18/19  [name], NP  Phenyleph-Doxyl-DM-Aspirin (ALKA-SELTZER PLUS COLD DY/NGHT PO) Take 1 tablet by mouth at bedtime as needed (cough).    [provider]    Allergies    Atorvastatin, Crestor [rosuvastatin calcium], Monosodium glutamate, Peanut-containing drug products, Zetia [ezetimibe], Ciprofloxin hcl [ciprofloxacin], and Food color red [red dye]  Review of Systems   Review of Systems  Cardiovascular:  Positive for chest pain.  All other systems reviewed and are negative.  Physical Exam Updated Vital Signs BP (!) 96/42   Pulse (!) 124   Temp 100.3 F (37.9 C) (Oral)   Resp 20   Ht '5\' 5"'$  (1.651 m)   Wt 65.8 kg   SpO2 100%   BMI 24.13 kg/m   Physical Exam Vitals and nursing note reviewed.  Constitutional:      Appearance: Normal appearance.  HENT:      Head: Normocephalic.  Nose: Nose normal.     Mouth/Throat:     Mouth: Mucous membranes are dry.  Eyes:     Extraocular Movements: Extraocular movements intact.     Pupils: Pupils are equal, round, and reactive to light.  Cardiovascular:     Rate and Rhythm: Regular rhythm. Tachycardia present.     Pulses: Normal pulses.  Pulmonary:     Comments: Diminished bilaterally. Abdominal:     General: Abdomen is flat.     Comments: Mild epigastric tenderness  Musculoskeletal:        General: Normal range of motion.     Cervical back: Normal range of motion.  Skin:    General: Skin is warm.     Capillary Refill: Capillary refill takes less than 2 seconds.  Neurological:     General: No focal deficit present.     Mental Status: She is alert and oriented to person, place, and time.  Psychiatric:        Mood and Affect: Mood normal.        Behavior: Behavior normal.    ED Results / Procedures / Treatments   Labs (all labs ordered are listed, but only abnormal results are displayed) Labs Reviewed  BASIC METABOLIC PANEL - Abnormal; Notable for the following components:      Result Value   Sodium 130 (*)    CO2 10 (*)    Glucose, Bld 550 (*)    BUN 21 (*)    Creatinine, Ser 1.67 (*)    GFR, Estimated 37 (*)    Anion gap 22 (*)    All other components within normal limits  CBC - Abnormal; Notable for the following components:   WBC 20.1 (*)    Platelets 455 (*)    All other components within normal limits  CBG MONITORING, ED - Abnormal; Notable for the following components:   Glucose-Capillary 509 (*)    All other components within normal limits  I-STAT CHEM 8, ED - Abnormal; Notable for the following components:   Sodium 126 (*)    Potassium 6.1 (*)    BUN 32 (*)    Creatinine, Ser 1.90 (*)    Glucose, Bld >700 (*)    Calcium, Ion 1.07 (*)    TCO2 9 (*)    All other components within normal limits  I-STAT VENOUS BLOOD GAS, ED - Abnormal; Notable for the following  components:   pH, Ven 7.145 (*)    pCO2, Ven 19.2 (*)    pO2, Ven 69.0 (*)    Bicarbonate 6.6 (*)    TCO2 7 (*)    Acid-base deficit 20.0 (*)    Sodium 126 (*)    Potassium 6.1 (*)    Calcium, Ion 1.05 (*)    All other components within normal limits  TROPONIN I (HIGH SENSITIVITY) - Abnormal; Notable for the following components:   Troponin I (High Sensitivity) 61 (*)    All other components within normal limits  CULTURE, BLOOD (ROUTINE X 2)  CULTURE, BLOOD (ROUTINE X 2)  RESP PANEL BY RT-PCR (FLU A&B, COVID) ARPGX2  LIPASE, BLOOD  URINALYSIS, ROUTINE W REFLEX MICROSCOPIC  LACTIC ACID, PLASMA  LACTIC ACID, PLASMA  BLOOD GAS, VENOUS  RAPID URINE DRUG SCREEN, HOSP PERFORMED  I-STAT BETA HCG BLOOD, ED ([facility], [facility], [facility] ONLY)  CBG MONITORING, ED  TROPONIN I (HIGH SENSITIVITY)    EKG EKG Interpretation  Date/Time:  Thursday September 30 2020 16:46:51 EDT Ventricular Rate:  130 PR Interval:  118 QRS Duration: 70 QT Interval:  334 QTC Calculation: 491 R Axis:   89 Text Interpretation: Sinus tachycardia Right atrial enlargement Nonspecific ST abnormality Abnormal ECG Since last tracing rate faster Confirmed by Wandra Arthurs [id number]) on 09/30/2020 6:48:42 PM  Radiology DG Chest 2 View  Result Date: 09/30/2020 CLINICAL DATA:  Chest pain and shortness of breath. EXAM: CHEST - 2 VIEW COMPARISON:  03/02/2020, CT 06/06/2019 FINDINGS: Patient is post median sternotomy.The cardiomediastinal contours are normal. Coronary stents are seen. Mild diffuse bronchial wall thickening with borderline hyperinflation. Pulmonary vasculature is normal. No consolidation, pleural effusion, or pneumothorax. No acute osseous abnormalities are seen. IMPRESSION: Mild diffuse bronchial wall thickening with borderline hyperinflation, can be seen with bronchitis or asthma. Electronically Signed   By: Keith Rake M.D.   On: 09/30/2020 17:42    Procedures Procedures   Angiocath insertion Performed by: Wandra Arthurs  Consent: Verbal consent obtained. Risks and benefits: risks, benefits and alternatives were discussed Time out: Immediately prior to procedure a "time out" was called to verify the correct patient, procedure, equipment, support staff and site/side marked as required.  Preparation: Patient was prepped and draped in the usual sterile fashion.  Vein Location: L antecube   Ultrasound Guided  Gauge: 20 long   Normal blood return and flush without difficulty Patient tolerance: Patient tolerated the procedure well with no immediate complications.  CRITICAL CARE Performed by: Wandra Arthurs   Total critical care time: 30 minutes  Critical care time was exclusive of separately billable procedures and treating other patients.  Critical care was necessary to treat or prevent imminent or life-threatening deterioration.  Critical care was time spent personally by me on the following activities: development of treatment plan with patient and/or surrogate as well as nursing, discussions with consultants, evaluation of patient's response to treatment, examination of patient, obtaining history from patient or surrogate, ordering and performing treatments and interventions, ordering and review of laboratory studies, ordering and review of radiographic studies, pulse oximetry and re-evaluation of patient's condition.   Medications Ordered in ED Medications  cefTRIAXone (ROCEPHIN) 1 g in sodium chloride 0.9 % 100 mL IVPB (has no administration in time range)  azithromycin (ZITHROMAX) 500 mg in sodium chloride 0.9 % 250 mL IVPB (500 mg Intravenous New Bag/Given 09/30/20 1932)  sodium chloride 0.9 % bolus 1,000 mL (has no administration in time range)  ondansetron (ZOFRAN) injection 4 mg (has no administration in time range)  insulin regular, human (MYXREDLIN) 100 units/ 100 mL infusion (has no administration in time range)  lactated ringers infusion (has no administration in time range)  dextrose 5  % in lactated ringers infusion (has no administration in time range)  dextrose 50 % solution 0-50 mL (has no administration in time range)  potassium chloride 10 mEq in 100 mL IVPB (has no administration in time range)  insulin aspart (novoLOG) injection 5 Units (5 Units Subcutaneous Given 09/30/20 1946)  acetaminophen (TYLENOL) tablet 650 mg (650 mg Oral Given 09/30/20 1947)    ED Course  I have reviewed the triage vital signs and the nursing notes.  Pertinent labs & imaging results that were available during my care of the patient were reviewed by me and considered in my medical decision making (see chart for details).    MDM Rules/Calculators/A&P                          BENTLEE BOECK is a 50 y.o.  female here with nausea and cough and fever and elevated blood sugar.  Concern for possible DKA versus sepsis from pneumonia versus intra-abdominal process vs PE.  We will get CBC and CMP and lactate and cultures and CT chest and abdomen pelvis.   10:41 PM CT showed possible pneumonia. COVID negative. WBC is 20. Lactate 6.  Patient meets sepsis criteria so we will give IV antibiotics.  Patient also has anion gap of 22 and is in DKA.  Patient is started on IV insulin.  Hospitalist to admit for DKA, pneumonia, sepsis   Final Clinical Impression(s) / ED Diagnoses Final diagnoses:  None    Rx / DC Orders ED Discharge Orders     None        Drenda Freeze, MD 09/30/20 2242

## 2020-09-30 NOTE — ED Triage Notes (Signed)
C/O high heart rate, shoulder and chest pain x 2 days. Stated hx of triple bypass 2 years ago

## 2020-09-30 NOTE — ED Provider Notes (Signed)
Emergency Medicine Provider Triage Evaluation Note  Jennifer Hogan , a 50 y.o. female  was evaluated in triage.  Pt complains of abdominal pain, nausea, chest pain x 2 days. Hx of triple bypass 2 years ago and diabetes with insulin dependence. Insulin pump came off due to sweating and she's had difficulty reattaching it.   Review of Systems  Positive: Fever, CP, SOB, abdominal pain, nausea, vomiting Negative: Constipation, diarrhea   Physical Exam  BP 124/64 (BP Location: Right Arm)   Pulse (!) 132   Temp 100.3 F (37.9 C) (Oral)   Resp 18   Ht '5\' 5"'$  (1.651 m)   Wt 65.8 kg   SpO2 100%   BMI 24.13 kg/m  Gen:   Awake, no distress   Resp:  Normal effort  MSK:   Moves extremities without difficulty  Other:  Tenderness to palpation of left chest wall under breast, abdomen soft and non-distended  Medical Decision Making  Medically screening exam initiated at 4:55 PM.  Appropriate orders placed.  Anissia A Frerichs was informed that the remainder of the evaluation will be completed by another provider, this initial triage assessment does not replace that evaluation, and the importance of remaining in the ED until their evaluation is complete.  CBG 509 in triage   Estill Cotta 09/30/20 [place], MD 09/30/20 2351

## 2020-10-01 ENCOUNTER — Encounter (HOSPITAL_COMMUNITY): Payer: Self-pay | Admitting: Internal Medicine

## 2020-10-01 DIAGNOSIS — N179 Acute kidney failure, unspecified: Secondary | ICD-10-CM

## 2020-10-01 LAB — CBC
HCT: 33.6 % — ABNORMAL LOW (ref 36.0–46.0)
Hemoglobin: 11.3 g/dL — ABNORMAL LOW (ref 12.0–15.0)
MCH: 32.1 pg (ref 26.0–34.0)
MCHC: 33.6 g/dL (ref 30.0–36.0)
MCV: 95.5 fL (ref 80.0–100.0)
Platelets: 406 10*3/uL — ABNORMAL HIGH (ref 150–400)
RBC: 3.52 MIL/uL — ABNORMAL LOW (ref 3.87–5.11)
RDW: 13.5 % (ref 11.5–15.5)
WBC: 24.9 10*3/uL — ABNORMAL HIGH (ref 4.0–10.5)
nRBC: 0 % (ref 0.0–0.2)

## 2020-10-01 LAB — URINALYSIS, MICROSCOPIC (REFLEX): Bacteria, UA: NONE SEEN

## 2020-10-01 LAB — BASIC METABOLIC PANEL
Anion gap: 11 (ref 5–15)
Anion gap: 15 (ref 5–15)
Anion gap: 11 (ref 5–15)
BUN: 27 mg/dL — ABNORMAL HIGH (ref 6–20)
BUN: 26 mg/dL — ABNORMAL HIGH (ref 6–20)
BUN: 25 mg/dL — ABNORMAL HIGH (ref 6–20)
CO2: 15 mmol/L — ABNORMAL LOW (ref 22–32)
CO2: 15 mmol/L — ABNORMAL LOW (ref 22–32)
CO2: 17 mmol/L — ABNORMAL LOW (ref 22–32)
Calcium: 8.7 mg/dL — ABNORMAL LOW (ref 8.9–10.3)
Calcium: 8.7 mg/dL — ABNORMAL LOW (ref 8.9–10.3)
Calcium: 8.3 mg/dL — ABNORMAL LOW (ref 8.9–10.3)
Chloride: 107 mmol/L (ref 98–111)
Chloride: 106 mmol/L (ref 98–111)
Chloride: 109 mmol/L (ref 98–111)
Creatinine, Ser: 1.26 mg/dL — ABNORMAL HIGH (ref 0.44–1.00)
Creatinine, Ser: 1.12 mg/dL — ABNORMAL HIGH (ref 0.44–1.00)
Creatinine, Ser: 1.1 mg/dL — ABNORMAL HIGH (ref 0.44–1.00)
GFR, Estimated: 52 mL/min — ABNORMAL LOW (ref >60)
GFR, Estimated: 60 mL/min — ABNORMAL LOW (ref >60)
GFR, Estimated: >60 mL/min (ref >60)
Glucose, Bld: 201 mg/dL — ABNORMAL HIGH (ref 70–99)
Glucose, Bld: 189 mg/dL — ABNORMAL HIGH (ref 70–99)
Glucose, Bld: 177 mg/dL — ABNORMAL HIGH (ref 70–99)
Potassium: 3.8 mmol/L (ref 3.5–5.1)
Potassium: 4 mmol/L (ref 3.5–5.1)
Potassium: 3.8 mmol/L (ref 3.5–5.1)
Sodium: 133 mmol/L — ABNORMAL LOW (ref 135–145)
Sodium: 136 mmol/L (ref 135–145)
Sodium: 137 mmol/L (ref 135–145)

## 2020-10-01 LAB — CBG MONITORING, ED
Glucose-Capillary: 128 mg/dL — ABNORMAL HIGH (ref 70–99)
Glucose-Capillary: 136 mg/dL — ABNORMAL HIGH (ref 70–99)
Glucose-Capillary: 142 mg/dL — ABNORMAL HIGH (ref 70–99)
Glucose-Capillary: 190 mg/dL — ABNORMAL HIGH (ref 70–99)
Glucose-Capillary: 266 mg/dL — ABNORMAL HIGH (ref 70–99)
Glucose-Capillary: 293 mg/dL — ABNORMAL HIGH (ref 70–99)
Glucose-Capillary: 298 mg/dL — ABNORMAL HIGH (ref 70–99)
Glucose-Capillary: 362 mg/dL — ABNORMAL HIGH (ref 70–99)
Glucose-Capillary: 385 mg/dL — ABNORMAL HIGH (ref 70–99)
Glucose-Capillary: 439 mg/dL — ABNORMAL HIGH (ref 70–99)
Glucose-Capillary: 465 mg/dL — ABNORMAL HIGH (ref 70–99)
Glucose-Capillary: 485 mg/dL — ABNORMAL HIGH (ref 70–99)

## 2020-10-01 LAB — RAPID URINE DRUG SCREEN, HOSP PERFORMED
Amphetamines: NOT DETECTED
Barbiturates: NOT DETECTED
Benzodiazepines: NOT DETECTED
Cocaine: NOT DETECTED
Opiates: NOT DETECTED
Tetrahydrocannabinol: POSITIVE — AB

## 2020-10-01 LAB — URINALYSIS, ROUTINE W REFLEX MICROSCOPIC
Glucose, UA: >=500 mg/dL — AB
Ketones, ur: >80 mg/dL — AB
Leukocytes,Ua: NEGATIVE
Nitrite: NEGATIVE
Protein, ur: NEGATIVE mg/dL
Specific Gravity, Urine: 1.01 (ref 1.005–1.030)
pH: 6 (ref 5.0–8.0)

## 2020-10-01 LAB — GLUCOSE, CAPILLARY
Glucose-Capillary: 119 mg/dL — ABNORMAL HIGH (ref 70–99)
Glucose-Capillary: 159 mg/dL — ABNORMAL HIGH (ref 70–99)
Glucose-Capillary: 164 mg/dL — ABNORMAL HIGH (ref 70–99)
Glucose-Capillary: 165 mg/dL — ABNORMAL HIGH (ref 70–99)
Glucose-Capillary: 169 mg/dL — ABNORMAL HIGH (ref 70–99)
Glucose-Capillary: 177 mg/dL — ABNORMAL HIGH (ref 70–99)
Glucose-Capillary: 189 mg/dL — ABNORMAL HIGH (ref 70–99)
Glucose-Capillary: 190 mg/dL — ABNORMAL HIGH (ref 70–99)

## 2020-10-01 LAB — HEMOGLOBIN A1C
Hgb A1c MFr Bld: 8.5 % — ABNORMAL HIGH (ref 4.8–5.6)
Mean Plasma Glucose: 197.25 mg/dL

## 2020-10-01 LAB — MRSA NEXT GEN BY PCR, NASAL: MRSA by PCR Next Gen: NOT DETECTED

## 2020-10-01 LAB — STREP PNEUMONIAE URINARY ANTIGEN: Strep Pneumo Urinary Antigen: NEGATIVE

## 2020-10-01 LAB — BETA-HYDROXYBUTYRIC ACID
Beta-Hydroxybutyric Acid: 2.16 mmol/L — ABNORMAL HIGH (ref 0.05–0.27)
Beta-Hydroxybutyric Acid: 1.34 mmol/L — ABNORMAL HIGH (ref 0.05–0.27)
Beta-Hydroxybutyric Acid: 0.92 mmol/L — ABNORMAL HIGH (ref 0.05–0.27)

## 2020-10-01 LAB — HIV ANTIBODY (ROUTINE TESTING W REFLEX): HIV Screen 4th Generation wRfx: NONREACTIVE

## 2020-10-01 LAB — TROPONIN I (HIGH SENSITIVITY): Troponin I (High Sensitivity): >24000 ng/L (ref <18)

## 2020-10-01 LAB — LACTIC ACID, PLASMA: Lactic Acid, Venous: 2.2 mmol/L (ref 0.5–1.9)

## 2020-10-01 MED ORDER — ALBUTEROL SULFATE (2.5 MG/3ML) 0.083% IN NEBU: 3.0000 mL | INHALATION_SOLUTION | Freq: Four times a day (QID) | RESPIRATORY_TRACT | Status: DC | PRN

## 2020-10-01 MED ORDER — ONDANSETRON HCL 4 MG/2ML IJ SOLN
4.0000 mg | Freq: Four times a day (QID) | INTRAMUSCULAR | Status: DC | PRN
  Administered 2020-10-01 – 2020-10-02 (×2): 4 mg via INTRAVENOUS
  Filled 2020-10-01 (×2): qty 2

## 2020-10-01 MED ORDER — LACTATED RINGERS IV BOLUS
1000.0000 mL | Freq: Once | INTRAVENOUS | Status: AC
  Administered 2020-10-01: 1000 mL via INTRAVENOUS

## 2020-10-01 MED ORDER — BECLOMETHASONE DIPROP HFA 40 MCG/ACT IN AERB: 2.0000 | INHALATION_SPRAY | Freq: Two times a day (BID) | RESPIRATORY_TRACT | Status: DC

## 2020-10-01 MED ORDER — METOCLOPRAMIDE HCL 5 MG/ML IJ SOLN
10.0000 mg | Freq: Three times a day (TID) | INTRAMUSCULAR | Status: DC | PRN
  Administered 2020-10-01 – 2020-10-02 (×2): 10 mg via INTRAVENOUS
  Filled 2020-10-01 (×2): qty 2

## 2020-10-01 MED ORDER — HEPARIN (PORCINE) 25000 UT/250ML-% IV SOLN
1250.0000 [IU]/h | INTRAVENOUS | Status: DC
  Administered 2020-10-01: 750 [IU]/h via INTRAVENOUS
  Administered 2020-10-03: 1250 [IU]/h via INTRAVENOUS
  Filled 2020-10-01 (×3): qty 250

## 2020-10-01 MED ORDER — FLUOXETINE HCL 20 MG PO CAPS
40.0000 mg | ORAL_CAPSULE | Freq: Every day | ORAL | Status: DC
  Administered 2020-10-02 – 2020-10-06 (×5): 40 mg via ORAL
  Filled 2020-10-01 (×5): qty 2

## 2020-10-01 MED ORDER — HEPARIN BOLUS VIA INFUSION
3700.0000 [IU] | Freq: Once | INTRAVENOUS | Status: AC
  Administered 2020-10-01: 3700 [IU] via INTRAVENOUS
  Filled 2020-10-01: qty 3700

## 2020-10-01 MED ORDER — BUDESONIDE 0.25 MG/2ML IN SUSP
0.2500 mg | Freq: Two times a day (BID) | RESPIRATORY_TRACT | Status: DC
  Administered 2020-10-01 – 2020-10-06 (×9): 0.25 mg via RESPIRATORY_TRACT
  Filled 2020-10-01 (×11): qty 2

## 2020-10-01 MED ORDER — METOPROLOL TARTRATE 12.5 MG HALF TABLET
12.5000 mg | ORAL_TABLET | Freq: Two times a day (BID) | ORAL | Status: DC
  Administered 2020-10-02: 12.5 mg via ORAL
  Filled 2020-10-01: qty 1

## 2020-10-01 NOTE — ED Notes (Signed)
Report given to Ivan Anchors, [place]

## 2020-10-01 NOTE — Consult Note (Signed)
Cardiology Consultation:   Patient ID: MIYOKO MOHSENI MRN: [id number]; DOB: August 25, 1970  Admit date: 09/30/2020 Date of Consult: 10/01/2020  PCP:  [name], NP   [facility] Providers Cardiologist:  Fransico Him, MD   {   Patient Profile:   VERNEAL SUI is a 50 y.o. female with a hx of coronary artery disease status post coronary artery bypass graft, diabetes mellitus, hyperlipidemia, ischemic cardiomyopathy who is being seen 10/01/2020 for the evaluation of non-ST elevation myocardial infarction at the request of Phillips Climes MD.  History of Present Illness:   Patient has had multiple PCI's in the past.  Patient is status post coronary artery bypass and graft in October 2020.  She had a LIMA to LAD/ramus intermedius and RIMA to the obtuse marginal.  Most recent echocardiogram November 2020 showed normal LV function and trace mitral/tricuspid regurgitation.  Patient has been admitted with DKA.  Enzymes were checked and she is ruled in for non-ST elevation microinfarction and cardiology is now asked to evaluate.  Patient states she developed nausea Wednesday evening after eating.  She had severe vomiting, diaphoresis and developed pain under her left rib cage by her report and also some back pain.  Also dyspneic.  Her symptoms persisted throughout the night.  She came to the emergency room and was admitted.  Presently denies chest pain, palpitations or dyspnea.  Cardiology now asked to evaluate.   Past Medical History:  Diagnosis Date   Anemia    Anxiety    Coronary artery disease    a. s/p PTCA DES x3 in the LAD (ostial DES placed, mid DES placed, distal DES placed) and PTCA/DES x1 to intermediate branch 02/2017. b. ACS 02/08/18 with LCx stent placement.   Depression    Diabetes mellitus    type 1, on insulin pump   DKA (diabetic ketoacidoses) 12/31/2017   Elevated platelet count    High cholesterol    Hypothyroidism    Ischemic cardiomyopathy    a. EF low-normal  with basal inferior akinesis, basal septal hypokinesis by echo 01/2018.    Past Surgical History:  Procedure Laterality Date   APPENDECTOMY     CORONARY ARTERY BYPASS GRAFT N/A 11/04/2018   Procedure: CORONARY ARTERY BYPASS GRAFTING (CABG), ON PUMP, TIMES THREE, USING LEFT AND RIGHT INTERNAL MAMMARY ARTERIES;  Surgeon: Wonda Olds, MD;  Location: [facility];  Service: Open Heart Surgery;  Laterality: N/A;   CORONARY STENT INTERVENTION N/A 03/20/2017   Procedure: DES x 3 LAD, DES OM; Surgeon: Burnell Blanks, MD;  Location: [place] [facility];  Service: Cardiovascular;  Laterality: N/A;   CORONARY/GRAFT ACUTE MI REVASCULARIZATION N/A 02/08/2018   Procedure: Coronary/Graft Acute MI Revascularization;  Surgeon: Belva Crome, MD;  Location: [place] [facility];  Service: Cardiovascular;  Laterality: N/A;   LEFT HEART CATH AND CORONARY ANGIOGRAPHY N/A 03/20/2017   Procedure: LEFT HEART CATH AND CORONARY ANGIOGRAPHY;  Surgeon: Burnell Blanks, MD;  Location: [place] [facility];  Service: Cardiovascular;  Laterality: N/A;   LEFT HEART CATH AND CORONARY ANGIOGRAPHY N/A 02/08/2018   Procedure: LEFT HEART CATH AND CORONARY ANGIOGRAPHY;  Surgeon: Belva Crome, MD;  Location: [place] [facility];  Service: Cardiovascular;  Laterality: N/A;   LEFT HEART CATH AND CORONARY ANGIOGRAPHY N/A 10/29/2018   Procedure: LEFT HEART CATH AND CORONARY ANGIOGRAPHY;  Surgeon: Burnell Blanks, MD;  Location: [place] [facility];  Service: Cardiovascular;  Laterality: N/A;   TEE WITHOUT CARDIOVERSION N/A 11/04/2018  Procedure: TRANSESOPHAGEAL ECHOCARDIOGRAM (TEE);  Surgeon: Wonda Olds, MD;  Location: [facility];  Service: Open Heart Surgery;  Laterality: N/A;       Inpatient Medications: Scheduled Meds:  aspirin EC  81 mg Oral Daily   budesonide  0.25 mg Nebulization BID   clopidogrel  75 mg Oral Daily   enoxaparin (LOVENOX) injection  40 mg Subcutaneous Q24H   FLUoxetine  40 mg Oral Daily    levothyroxine  100 mcg Oral Q0600   Continuous Infusions:  azithromycin     cefTRIAXone (ROCEPHIN)  IV Stopped (10/01/20 0828)   dextrose 5% lactated ringers 125 mL/hr at 10/01/20 1718   insulin 0.2 Units/hr (10/01/20 1752)   lactated ringers     lactated ringers     sodium chloride     PRN Meds: albuterol, dextrose, metoCLOPramide (REGLAN) injection, ondansetron (ZOFRAN) IV  Allergies:    Allergies  Allergen Reactions   Atorvastatin Other (See Comments)    Myalgias on '80mg'$  dose, tolerates '20mg'$  ok   Crestor [Rosuvastatin Calcium] Other (See Comments)    Severe myalgias and joint pain. Has tolerated atorvastatin though   Monosodium Glutamate Nausea And Vomiting and Other (See Comments)    migraine   Peanut-Containing Drug Products Other (See Comments)    Vomiting, upset stomach and some wheezing   Zetia [Ezetimibe] Other (See Comments)    myalgias   Ciprofloxin Hcl [Ciprofloxacin] Nausea And Vomiting and Other (See Comments)    Severe migraine   Food Color Red [Red Dye] Diarrhea    Social History:   Social History   Socioeconomic History   Marital status: Divorced    Spouse name: Not on file   Number of children: Not on file   Years of education: Not on file   Highest education level: Not on file  Occupational History   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"  Tobacco Use   Smoking status: Never   Smokeless tobacco: Never   Tobacco comments:    Use marijuana  Vaping Use   Vaping Use: Never used  Substance and Sexual Activity   Alcohol use: No    Alcohol/week: 0.0 standard drinks   Drug use: Not Currently    Types: Marijuana    Comment: last use last week, occaisonal marijuana use   Sexual activity: Not Currently    Birth control/protection: None  Other Topics Concern   Not on file  Social History Narrative   Not on file   Social Determinants of Health   Financial Resource Strain: Not on file  Food Insecurity: Not on file  Transportation Needs: Not on  file  Physical Activity: Not on file  Stress: Not on file  Social Connections: Not on file  Intimate Partner Violence: Not on file    Family History:    Family History  Problem Relation Age of Onset   Cancer Mother        T cell lymphoma   Coronary artery disease Father    Congestive Heart Failure Father    Asthma Father      ROS:  Please see the history of present illness.  No fevers, chills, productive cough or hemoptysis. All other ROS reviewed and negative.     Physical Exam/Data:   Vitals:   10/01/20 1600 10/01/20 1638 10/01/20 1700 10/01/20 1737  BP: 123/77 123/77 130/78   Pulse: (!) 103  (!) 103 (!) 103  Resp: (!) 34  (!) 33 (!) 33  Temp:  98.5 F (36.9 C)  TempSrc:  Oral    SpO2: 93%  91% 94%  Weight:      Height:        Intake/Output Summary (Last 24 hours) at 10/01/2020 1817 Last data filed at 10/01/2020 1752 Gross per 24 hour  Intake 4469.28 ml  Output 900 ml  Net 3569.28 ml   Last 3 Weights 10/01/2020 09/30/2020 11/17/2019  Weight (lbs) 137 lb 2 oz 145 lb 156 lb 12.8 oz  Weight (kg) 62.2 kg 65.772 kg 71.124 kg     Body mass index is 22.82 kg/m.  General:  Well nourished, well developed, in no acute distress HEENT: normal Lymph: no adenopathy Neck: no JVD Endocrine:  No thryomegaly Vascular: No carotid bruits; FA pulses 2+ bilaterally without bruits  Cardiac:  normal S1, S2; RRR; no murmur; positive S4 Lungs:  clear to auscultation bilaterally, no wheezing, rhonchi or rales  Abd: soft, no hepatomegaly, mild diffuse tenderness. Ext: no edema Musculoskeletal:  No deformities, BUE and BLE strength normal and equal Skin: warm and dry  Neuro:  CNs 2-12 intact, no focal abnormalities noted Psych:  Normal affect   EKG:  The EKG was personally reviewed and demonstrates: Sinus tachycardia, right atrial enlargement, nonspecific ST changes. Telemetry:  Telemetry was personally reviewed and demonstrates: Normal sinus rhythm  Laboratory Data:  High  Sensitivity Troponin:   Recent Labs  Lab 09/30/20 1658 09/30/20 1853 10/01/20 1553  TROPONINIHS 61* 54* >24,000*     Chemistry Recent Labs  Lab 09/30/20 1658 09/30/20 1947 09/30/20 1948 10/01/20 1553  NA 130* 126* 126* 133*  K 4.8 6.1* 6.1* 3.8  CL 98 104  --  107  CO2 10*  --   --  15*  GLUCOSE 550* >700*  --  201*  BUN 21* 32*  --  27*  CREATININE 1.67* 1.90*  --  1.26*  CALCIUM 9.5  --   --  8.7*  GFRNONAA 37*  --   --  52*  ANIONGAP 22*  --   --  11     Hematology Recent Labs  Lab 09/30/20 1658 09/30/20 1947 09/30/20 1948 10/01/20 0057  WBC 20.1*  --   --  24.9*  RBC 3.94  --   --  3.52*  HGB 12.4 13.3 13.3 11.3*  HCT 37.6 39.0 39.0 33.6*  MCV 95.4  --   --  95.5  MCH 31.5  --   --  32.1  MCHC 33.0  --   --  33.6  RDW 13.4  --   --  13.5  PLT 455*  --   --  406*    Radiology/Studies:  DG Chest 2 View  Result Date: 09/30/2020 CLINICAL DATA:  Chest pain and shortness of breath. EXAM: CHEST - 2 VIEW COMPARISON:  03/02/2020, CT 06/06/2019 FINDINGS: Patient is post median sternotomy.The cardiomediastinal contours are normal. Coronary stents are seen. Mild diffuse bronchial wall thickening with borderline hyperinflation. Pulmonary vasculature is normal. No consolidation, pleural effusion, or pneumothorax. No acute osseous abnormalities are seen. IMPRESSION: Mild diffuse bronchial wall thickening with borderline hyperinflation, can be seen with bronchitis or asthma. Electronically Signed   By: Keith Rake M.D.   On: 09/30/2020 17:42   CT Angio Chest PE W and/or Wo Contrast  Result Date: 09/30/2020 CLINICAL DATA:  PE suspected, high prob Chest pain and shortness of breath. EXAM: CT ANGIOGRAPHY CHEST WITH CONTRAST TECHNIQUE: Multidetector CT imaging of the chest was performed using the standard protocol during bolus administration of intravenous contrast. Multiplanar CT  image reconstructions and MIPs were obtained to evaluate the vascular anatomy. CONTRAST:  19m  OMNIPAQUE IOHEXOL 350 MG/ML SOLN COMPARISON:  Radiograph earlier today.  Chest CTA 06/06/2019 FINDINGS: Cardiovascular: There are no filling defects within the pulmonary arteries to suggest pulmonary embolus. Normal caliber thoracic aorta. Post CABG with calcification of native coronary arteries. The heart is normal in size. No pericardial effusion. Mediastinum/Nodes: No enlarged mediastinal lymph nodes. Few scattered bilateral hilar nodes are not enlarged by size criteria. Small hiatal hernia. Lungs/Pleura: Patchy areas of ground-glass opacity throughout both lungs in a peripheral and upper lobe predominant distribution. Moderate central bronchial thickening. No pleural effusion. No pulmonary nodule or mass. Upper Abdomen: Assessed on concurrent abdominal CT, reported separately. Musculoskeletal: Post median sternotomy. Thoracic spondylosis with midthoracic There are no acute or suspicious osseous abnormalities. degenerative disc disease. Review of the MIP images confirms the above findings. IMPRESSION: 1. No pulmonary embolus. 2. Patchy areas of ground-glass opacity throughout both lungs in a peripheral and upper lobe predominant distribution. Findings may represent pneumonia, including COVID-19. Possibility of eosinophilic pneumonia or interstitial pneumonias are also considered. 3. Moderate central bronchial thickening, can be seen with bronchitis or reactive airways disease. 4. Small hiatal hernia. Electronically Signed   By: MKeith RakeM.D.   On: 09/30/2020 21:58   CT ABDOMEN PELVIS W CONTRAST  Result Date: 09/30/2020 CLINICAL DATA:  Abdominal distension. EXAM: CT ABDOMEN AND PELVIS WITH CONTRAST TECHNIQUE: Multidetector CT imaging of the abdomen and pelvis was performed using the standard protocol following bolus administration of intravenous contrast. CONTRAST:  1041mOMNIPAQUE IOHEXOL 350 MG/ML SOLN COMPARISON:  Assessed on chest CTA performed concurrently. FINDINGS: Lower chest: Abdominopelvic CT  07/14/2019 Hepatobiliary: Mild hepatic steatosis. No evidence of focal liver lesion. Gallbladder physiologically distended, no calcified stone. No biliary dilatation. Pancreas: No ductal dilatation or inflammation. Spleen: Normal in size without focal abnormality. Adrenals/Urinary Tract: Normal adrenal glands. No hydronephrosis or perinephric edema. Homogeneous renal enhancement. No evidence of focal lesion or stone. Urinary bladder is distended without wall thickening. Stomach/Bowel: Small hiatal hernia. Stomach is partially distended, unremarkable. There is no small bowel obstruction or inflammation. Appendectomy. The colon is near completely decompressed and not well assessed, no obvious pericolonic edema. Vascular/Lymphatic: Normal caliber abdominal aorta. Minimal aortic atherosclerosis. Patent portal vein. No enlarged lymph nodes in the abdomen or pelvis. Reproductive: Uterus and bilateral adnexa are unremarkable. Other: No free air, free fluid, or intra-abdominal fluid collection. Musculoskeletal: Degenerative disc disease at L3-L4 and L4-L5 with Modic endplate changes and vacuum phenomena. Stable appearance from prior exam. There are no acute or suspicious osseous abnormalities. IMPRESSION: 1. No acute abnormality in the abdomen/pelvis. 2. Mild hepatic steatosis. 3. Small hiatal hernia. Aortic Atherosclerosis (ICD10-I70.0). Electronically Signed   By: MeKeith Rake.D.   On: 09/30/2020 22:03     Assessment and Plan:   Non-ST elevation myocardial infarction-patient was admitted with DKA and is ruled in for non-ST elevation myocardial infarction.  She is presently pain-free and electrocardiogram shows no ST elevation.  We will treat medically at this point.  Continue aspirin and Plavix.  We will add IV heparin and low-dose metoprolol 12.5 mg twice daily.  She has an intolerance to statins.  We will arrange an echocardiogram to assess LV function.  She will require cardiac catheterization once her DKA  improves.  This will likely be Monday.  The risk and benefits including myocardial infarction, CVA and death discussed and she agrees to proceed. Acute kidney injury-creatinine at time of admission 1.67.  Likely partially related to DKA.  We will follow renal function which appears to be improving with hydration. DKA-managed by primary care. Hyperlipidemia-patient apparently has an intolerance to statins and Zetia.  Following discharge will need to follow-up in the lipid clinic for consideration of Repatha or Praluent. Question pneumonia-antibiotics per primary care.  Risk Assessment/Risk Scores:   :[id number]   TIMI Risk Score for Unstable Angina or Non-ST Elevation MI:   The patient's TIMI risk score is 4, which indicates a 20% risk of all cause mortality, new or recurrent myocardial infarction or need for urgent revascularization in the next 14 days.{           For questions or updates, please contact [facility] Please consult [email or web address] for contact info under    Signed, Kirk Ruths, MD  10/01/2020 6:17 PM

## 2020-10-01 NOTE — Progress Notes (Addendum)
PROGRESS NOTE    Jennifer Hogan  [id number] DOB: [birth year] DOA: 09/30/2020 PCP: [name], NP    Chief Complaint  Patient presents with   Tachycardia   Shoulder Pain   Back Pain    Brief Narrative:     This is a no charge note as patient was seen and admitted earlier today by Dr. Hal Hope, chart, imaging and labs were reviewed, patient was seen and examined.   HPI: Jennifer Hogan is a 50 y.o. female with known history of diabetes mellitus type 1 on insulin pump, CAD status post stenting and CABG, hypothyroidism presents to the ER after patient started feeling weak with elevated blood sugar.  Patient states 2 days ago she ate something and following which she had vomiting episodes.  Patient also has been having diaphoresis.  Patient was sweating so much that her insulin pump got detached.  Yesterday morning patient's blood sugars were running high patient was feeling weak and fatigue some chest pressure and sneezing.  Patient decided to come to the ER.  Patient states over the last 2 weeks she has been feeling fatigued.  In addition patient stated that she was tachycardic.   ED Course: In the ER patient is found to be having low normal blood pressure with labs showing creatinine of 1.6 bicarb of 10 and anion gap of 22 WBC count of 20,000 with high sensitive troponin was 61 and 54 EKG showing sinus tachycardia with nonspecific ST changes.  Given the nausea vomiting chest pressure patient underwent CT abdomen pelvis and CT angiogram of the chest.  CT abdomen pelvis was unremarkable.  CT angio of the chest shows bilateral infiltrates concerning for pneumonia other differentials include eosinophilic pneumonia and interstitial pneumonia.  Patient's lactic acid was 6.3.  Was started on fluid bolus following which lactic acid improved.  Given the patient had fever 100.1 F leukocytosis and CT scan showing features concerning for pneumonia was started on empiric antibiotics.  Patient  was also placed on insulin infusion for DKA.    Assessment & Plan:   Active Problems:   DKA (diabetic ketoacidosis) ([place])   DKA, type 1 ([place])   ARF (acute renal failure) ([facility])      Diabetes ketoacidosis -  likely from patient's insulin pump getting detached.  Could also be precipitated by possible pneumonia.   -Continue with DKA protocol, she remains on insulin drip, IV fluids, continue to monitor her BMP closely, and plan for headrest to transition her insulin pump 1 hour before discontinuing insulin drip, diabetic clinical improvement appreciated.     Possible pneumonia with likely developing sepsis on empiric antibiotics.  Other differentials include eosinophilic pneumonia and interstitial pneumonia.  Presently not hypoxic. -She is still complaining of cough, she was encouraged with incentive spirometry and flutter valve once brought to her.  Acute renal failure  - creatinine has worsened from 1.31 July 2020 it is around 1.6 and likely from vomiting.  I think it will improve with fluids.  Nausea vomiting could be from DKA.  CT abdomen pelvis was unremarkable.  History of hypothyroidism on Synthroid.  CAD status post CABG and stenting presently chest pain-free but did have some chest pressure earlier.  Will trend cardiac markers.  Patient takes aspirin and Plavix.  Not on any beta-blockers or other medications.  History of ischemic cardiomyopathy recent 2D echo showed improved EF.  Last echocardiogram on November 2020 showed EF of 55 to 60%.  Appears compensated.  History of anxiety/depression on Prozac.  DVT prophylaxis: Lovenox Code Status: Full Family Communication: None at bedside Disposition:   Status is: Inpatient  Remains inpatient appropriate because:IV treatments appropriate due to intensity of illness or inability to take PO  Dispo: The patient is from: Home              Anticipated d/c is to: Home              Patient currently is not medically stable to  d/c.   Difficult to place patient No       Consultants:  None   Subjective:  She still reports nausea, very poor appetite, generalized body ache, cough  Objective: Vitals:   10/01/20 1415 10/01/20 1430 10/01/20 1445 10/01/20 1530  BP: 115/77 118/77 121/72 125/75  Pulse: (!) 102 100 100 (!) 102  Resp: (!) '26 17 20 '$ (!) 33  Temp:    99.8 F (37.7 C)  TempSrc:    Oral  SpO2: 96% 95% 97% 93%  Weight:    62.2 kg  Height:    '5\' 5"'$  (1.651 m)    Intake/Output Summary (Last 24 hours) at 10/01/2020 1612 Last data filed at 10/01/2020 0828 Gross per 24 hour  Intake 4469.28 ml  Output --  Net 4469.28 ml   Filed Weights   09/30/20 1646 10/01/20 1530  Weight: 65.8 kg 62.2 kg    Examination:  General exam: Appears calm and comfortable  Respiratory system: Clear to auscultation. Respiratory effort normal. Cardiovascular system: S1 & S2 heard, RRR. No JVD, murmurs, rubs, gallops or clicks. No pedal edema. Gastrointestinal system: Abdomen is nondistended, soft and nontender. No organomegaly or masses felt. Normal bowel sounds heard. Central nervous system: Alert and oriented. No focal neurological deficits. Extremities: Symmetric 5 x 5 power. Skin: No rashes, lesions or ulcers Psychiatry: Judgement and insight appear normal. Mood & affect appropriate.     Data Reviewed: I have personally reviewed following labs and imaging studies  CBC: Recent Labs  Lab 09/30/20 1658 09/30/20 1947 09/30/20 1948 10/01/20 0057  WBC 20.1*  --   --  24.9*  HGB 12.4 13.3 13.3 11.3*  HCT 37.6 39.0 39.0 33.6*  MCV 95.4  --   --  95.5  PLT 455*  --   --  406*    Basic Metabolic Panel: Recent Labs  Lab 09/30/20 1658 09/30/20 1947 09/30/20 1948  NA 130* 126* 126*  K 4.8 6.1* 6.1*  CL 98 104  --   CO2 10*  --   --   GLUCOSE 550* >700*  --   BUN 21* 32*  --   CREATININE 1.67* 1.90*  --   CALCIUM 9.5  --   --     GFR: Estimated Creatinine Clearance: 31.5 mL/min (A) (by C-G formula  based on SCr of 1.9 mg/dL (H)).  Liver Function Tests: No results for input(s): AST, ALT, ALKPHOS, BILITOT, PROT, ALBUMIN in the last 168 hours.  CBG: Recent Labs  Lab 10/01/20 1127 10/01/20 1216 10/01/20 1248 10/01/20 1323 10/01/20 1432  GLUCAP 439* 485* 465* 385* 298*     Recent Results (from the past 240 hour(s))  Resp Panel by RT-PCR (Flu A&B, Covid) Nasopharyngeal Swab     Status: None   Collection Time: 09/30/20  6:55 PM   Specimen: Nasopharyngeal Swab; Nasopharyngeal(NP) swabs in vial transport medium  Result Value Ref Range Status   SARS Coronavirus 2 by RT PCR NEGATIVE NEGATIVE Final    Comment: (NOTE) SARS-CoV-2 target nucleic acids are NOT DETECTED.  The SARS-CoV-2 RNA is generally detectable in upper respiratory specimens during the acute phase of infection. The lowest concentration of SARS-CoV-2 viral copies this assay can detect is 138 copies/mL. A negative result does not preclude SARS-Cov-2 infection and should not be used as the sole basis for treatment or other patient management decisions. A negative result may occur with  improper specimen collection/handling, submission of specimen other than nasopharyngeal swab, presence of viral mutation(s) within the areas targeted by this assay, and inadequate number of viral copies(<138 copies/mL). A negative result must be combined with clinical observations, patient history, and epidemiological information. The expected result is Negative.  Fact Sheet for Patients:  EntrepreneurPulse.com.au  Fact Sheet for Healthcare Providers:  IncredibleEmployment.be  This test is no t yet approved or cleared by the [place] FDA and  has been authorized for detection and/or diagnosis of SARS-CoV-2 by FDA under an Emergency Use Authorization (EUA). This EUA will remain  in effect (meaning this test can be used) for the duration of the COVID-19 declaration under Section 564(b)(1) of  the Act, 21 U.S.C.section 360bbb-3(b)(1), unless the authorization is terminated  or revoked sooner.       Influenza A by PCR NEGATIVE NEGATIVE Final   Influenza B by PCR NEGATIVE NEGATIVE Final    Comment: (NOTE) The Xpert Xpress SARS-CoV-2/FLU/RSV plus assay is intended as an aid in the diagnosis of influenza from Nasopharyngeal swab specimens and should not be used as a sole basis for treatment. Nasal washings and aspirates are unacceptable for Xpert Xpress SARS-CoV-2/FLU/RSV testing.  Fact Sheet for Patients: EntrepreneurPulse.com.au  Fact Sheet for Healthcare Providers: IncredibleEmployment.be  This test is not yet approved or cleared by the [place] FDA and has been authorized for detection and/or diagnosis of SARS-CoV-2 by FDA under an Emergency Use Authorization (EUA). This EUA will remain in effect (meaning this test can be used) for the duration of the COVID-19 declaration under Section 564(b)(1) of the Act, 21 U.S.C. section 360bbb-3(b)(1), unless the authorization is terminated or revoked.  Performed at [facility], [place] [address] [place], [place] [address]   Blood culture (routine x 2)     Status: None (Preliminary result)   Collection Time: 09/30/20 11:36 PM   Specimen: BLOOD LEFT HAND  Result Value Ref Range Status   Specimen Description BLOOD LEFT HAND  Final   Special Requests   Final    BOTTLES DRAWN AEROBIC AND ANAEROBIC Blood Culture results may not be optimal due to an inadequate volume of blood received in culture bottles   Culture   Final    NO GROWTH < 12 HOURS Performed at [facility], [place] [address] [place], [place] [address]    Report Status PENDING  Incomplete  Blood culture (routine x 2)     Status: None (Preliminary result)   Collection Time: 10/01/20 12:00 AM   Specimen: BLOOD  Result Value Ref Range Status   Specimen Description BLOOD SITE NOT SPECIFIED  Final   Special Requests    Final    BOTTLES DRAWN AEROBIC AND ANAEROBIC Blood Culture results may not be optimal due to an inadequate volume of blood received in culture bottles   Culture   Final    NO GROWTH < 12 HOURS Performed at [facility], [place] [address] [place],  [address]    Report Status PENDING  Incomplete         Radiology Studies: DG Chest 2 View  Result Date: 09/30/2020 CLINICAL  DATA:  Chest pain and shortness of breath. EXAM: CHEST - 2 VIEW COMPARISON:  03/02/2020, CT 06/06/2019 FINDINGS: Patient is post median sternotomy.The cardiomediastinal contours are normal. Coronary stents are seen. Mild diffuse bronchial wall thickening with borderline hyperinflation. Pulmonary vasculature is normal. No consolidation, pleural effusion, or pneumothorax. No acute osseous abnormalities are seen. IMPRESSION: Mild diffuse bronchial wall thickening with borderline hyperinflation, can be seen with bronchitis or asthma. Electronically Signed   By: Keith Rake M.D.   On: 09/30/2020 17:42   CT Angio Chest PE W and/or Wo Contrast  Result Date: 09/30/2020 CLINICAL DATA:  PE suspected, high prob Chest pain and shortness of breath. EXAM: CT ANGIOGRAPHY CHEST WITH CONTRAST TECHNIQUE: Multidetector CT imaging of the chest was performed using the standard protocol during bolus administration of intravenous contrast. Multiplanar CT image reconstructions and MIPs were obtained to evaluate the vascular anatomy. CONTRAST:  141m OMNIPAQUE IOHEXOL 350 MG/ML SOLN COMPARISON:  Radiograph earlier today.  Chest CTA 06/06/2019 FINDINGS: Cardiovascular: There are no filling defects within the pulmonary arteries to suggest pulmonary embolus. Normal caliber thoracic aorta. Post CABG with calcification of native coronary arteries. The heart is normal in size. No pericardial effusion. Mediastinum/Nodes: No enlarged mediastinal lymph nodes. Few scattered bilateral hilar nodes are not enlarged by size criteria. Small hiatal hernia.  Lungs/Pleura: Patchy areas of ground-glass opacity throughout both lungs in a peripheral and upper lobe predominant distribution. Moderate central bronchial thickening. No pleural effusion. No pulmonary nodule or mass. Upper Abdomen: Assessed on concurrent abdominal CT, reported separately. Musculoskeletal: Post median sternotomy. Thoracic spondylosis with midthoracic There are no acute or suspicious osseous abnormalities. degenerative disc disease. Review of the MIP images confirms the above findings. IMPRESSION: 1. No pulmonary embolus. 2. Patchy areas of ground-glass opacity throughout both lungs in a peripheral and upper lobe predominant distribution. Findings may represent pneumonia, including COVID-19. Possibility of eosinophilic pneumonia or interstitial pneumonias are also considered. 3. Moderate central bronchial thickening, can be seen with bronchitis or reactive airways disease. 4. Small hiatal hernia. Electronically Signed   By: MKeith RakeM.D.   On: 09/30/2020 21:58   CT ABDOMEN PELVIS W CONTRAST  Result Date: 09/30/2020 CLINICAL DATA:  Abdominal distension. EXAM: CT ABDOMEN AND PELVIS WITH CONTRAST TECHNIQUE: Multidetector CT imaging of the abdomen and pelvis was performed using the standard protocol following bolus administration of intravenous contrast. CONTRAST:  1055mOMNIPAQUE IOHEXOL 350 MG/ML SOLN COMPARISON:  Assessed on chest CTA performed concurrently. FINDINGS: Lower chest: Abdominopelvic CT 07/14/2019 Hepatobiliary: Mild hepatic steatosis. No evidence of focal liver lesion. Gallbladder physiologically distended, no calcified stone. No biliary dilatation. Pancreas: No ductal dilatation or inflammation. Spleen: Normal in size without focal abnormality. Adrenals/Urinary Tract: Normal adrenal glands. No hydronephrosis or perinephric edema. Homogeneous renal enhancement. No evidence of focal lesion or stone. Urinary bladder is distended without wall thickening. Stomach/Bowel: Small  hiatal hernia. Stomach is partially distended, unremarkable. There is no small bowel obstruction or inflammation. Appendectomy. The colon is near completely decompressed and not well assessed, no obvious pericolonic edema. Vascular/Lymphatic: Normal caliber abdominal aorta. Minimal aortic atherosclerosis. Patent portal vein. No enlarged lymph nodes in the abdomen or pelvis. Reproductive: Uterus and bilateral adnexa are unremarkable. Other: No free air, free fluid, or intra-abdominal fluid collection. Musculoskeletal: Degenerative disc disease at L3-L4 and L4-L5 with Modic endplate changes and vacuum phenomena. Stable appearance from prior exam. There are no acute or suspicious osseous abnormalities. IMPRESSION: 1. No acute abnormality in the abdomen/pelvis. 2. Mild hepatic steatosis. 3. Small hiatal hernia.  Aortic Atherosclerosis (ICD10-I70.0). Electronically Signed   By: Keith Rake M.D.   On: 09/30/2020 22:03        Scheduled Meds:  aspirin EC  81 mg Oral Daily   budesonide  0.25 mg Nebulization BID   clopidogrel  75 mg Oral Daily   enoxaparin (LOVENOX) injection  40 mg Subcutaneous Q24H   FLUoxetine  40 mg Oral Daily   levothyroxine  100 mcg Oral Q0600   Continuous Infusions:  azithromycin     cefTRIAXone (ROCEPHIN)  IV Stopped (10/01/20 0828)   dextrose 5% lactated ringers Stopped (10/01/20 1145)   insulin 2 Units/hr (10/01/20 1540)   lactated ringers     lactated ringers     sodium chloride       LOS: 1 day       Phillips Climes, MD [facility]   To contact the attending provider between 7A-7P or the covering provider during after hours 7P-7A, please log into the web site [email or web address] and access using universal [place] password for that web site. If you do not have the password, please call the hospital operator.  10/01/2020, 4:12 PM

## 2020-10-01 NOTE — Progress Notes (Addendum)
Inpatient Diabetes Program Recommendations  AACE/ADA: New Consensus Statement on Inpatient Glycemic Control (2015)  Target Ranges:  Prepandial:   less than 140 mg/dL      Peak postprandial:   less than 180 mg/dL (1-2 hours)      Critically ill patients:  140 - 180 mg/dL   Lab Results  Component Value Date   GLUCAP 293 (H) 10/01/2020   HGBA1C 9.2 (H) 07/30/2020    Review of Glycemic Control  Diabetes history: DM type 1 Outpatient Diabetes medications: insulin pump with Humalog basal rate of 0.7 units/hr, 6 AM-10 AM, 0.9 units/hr, 10 AM-10 PM, and 0.625 units/hr overnight (when not in "auto mode").  Total of 16.8 units of basal insulin in a 24 hour period  bolus of 1 unit/16 grams CHO. correction bolus (which some people call "sensitivity," or "insulin sensitivity ratio," or just "isr") of 1 unit for each 75 by which your glucose exceeds 100.   Sees Renato Shin, Endocrinology Current orders for Inpatient glycemic control:  IV insulin/Endotool for DKA   Inpatient Diabetes Program Recommendations:    May can transition back to home insulin pump with a new insertion site and new supplies. Over lap 1 hour with IV insulin once pump is restarted.   If pt unable to be placed back on insulin pump consider: - Levemir 16 units - Novolog 0-9 units tid + hs - Novolog 3 units tid meal coverage if eating >50% of meals when eating  IV insulin not running while in pts room. Pt N/V and rapid breathing. Checked glucose 439. Notified Dr. [facility]. New orders placed. Notified pts RN. IV insulin restarted on pt.  IV insulin for now. Restart insulin pump within 24 hours.  Thanks,  Tama Headings RN, MSN, BC-ADM Inpatient Diabetes Coordinator Team Pager [phone] (8a-5p)

## 2020-10-01 NOTE — H&P (Signed)
History and Physical    Jennifer Hogan [id number] DOB: 1970/08/26 DOA: 09/30/2020  PCP: [name], NP  Patient coming from: Home.  Chief Complaint: Chest pressure elevated blood sugar weakness.  HPI: Jennifer Hogan is a 50 y.o. female with known history of diabetes mellitus type 1 on insulin pump, CAD status post stenting and CABG, hypothyroidism presents to the ER after patient started feeling weak with elevated blood sugar.  Patient states 2 days ago she ate something and following which she had vomiting episodes.  Patient also has been having diaphoresis.  Patient was sweating so much that her insulin pump got detached.  Yesterday morning patient's blood sugars were running high patient was feeling weak and fatigue some chest pressure and sneezing.  Patient decided to come to the ER.  Patient states over the last 2 weeks she has been feeling fatigued.  In addition patient stated that she was tachycardic.  ED Course: In the ER patient is found to be having low normal blood pressure with labs showing creatinine of 1.6 bicarb of 10 and anion gap of 22 WBC count of 20,000 with high sensitive troponin was 61 and 54 EKG showing sinus tachycardia with nonspecific ST changes.  Given the nausea vomiting chest pressure patient underwent CT abdomen pelvis and CT angiogram of the chest.  CT abdomen pelvis was unremarkable.  CT angio of the chest shows bilateral infiltrates concerning for pneumonia other differentials include eosinophilic pneumonia and interstitial pneumonia.  Patient's lactic acid was 6.3.  Was started on fluid bolus following which lactic acid improved.  Given the patient had fever 100.1 F leukocytosis and CT scan showing features concerning for pneumonia was started on empiric antibiotics.  Patient was also placed on insulin infusion for DKA.  Review of Systems: As per HPI, rest all negative.   Past Medical History:  Diagnosis Date   Anemia    Anxiety    Coronary  artery disease    a. s/p PTCA DES x3 in the LAD (ostial DES placed, mid DES placed, distal DES placed) and PTCA/DES x1 to intermediate branch 02/2017. b. ACS 02/08/18 with LCx stent placement.   Depression    Diabetes mellitus    type 1, on insulin pump   DKA (diabetic ketoacidoses) 12/31/2017   Elevated platelet count    High cholesterol    Hypothyroidism    Ischemic cardiomyopathy    a. EF low-normal with basal inferior akinesis, basal septal hypokinesis by echo 01/2018.    Past Surgical History:  Procedure Laterality Date   APPENDECTOMY     CORONARY ARTERY BYPASS GRAFT N/A 11/04/2018   Procedure: CORONARY ARTERY BYPASS GRAFTING (CABG), ON PUMP, TIMES THREE, USING LEFT AND RIGHT INTERNAL MAMMARY ARTERIES;  Surgeon: Wonda Olds, MD;  Location: [facility];  Service: Open Heart Surgery;  Laterality: N/A;   CORONARY STENT INTERVENTION N/A 03/20/2017   Procedure: DES x 3 LAD, DES OM; Surgeon: Burnell Blanks, MD;  Location: [facility];  Service: Cardiovascular;  Laterality: N/A;   CORONARY/GRAFT ACUTE MI REVASCULARIZATION N/A 02/08/2018   Procedure: Coronary/Graft Acute MI Revascularization;  Surgeon: Belva Crome, MD;  Location: [facility];  Service: Cardiovascular;  Laterality: N/A;   LEFT HEART CATH AND CORONARY ANGIOGRAPHY N/A 03/20/2017   Procedure: LEFT HEART CATH AND CORONARY ANGIOGRAPHY;  Surgeon: Burnell Blanks, MD;  Location: [facility];  Service: Cardiovascular;  Laterality: N/A;   LEFT HEART CATH AND CORONARY ANGIOGRAPHY N/A 02/08/2018  Procedure: LEFT HEART CATH AND CORONARY ANGIOGRAPHY;  Surgeon: Belva Crome, MD;  Location: [facility];  Service: Cardiovascular;  Laterality: N/A;   LEFT HEART CATH AND CORONARY ANGIOGRAPHY N/A 10/29/2018   Procedure: LEFT HEART CATH AND CORONARY ANGIOGRAPHY;  Surgeon: Burnell Blanks, MD;  Location: [facility];  Service: Cardiovascular;  Laterality: N/A;   TEE WITHOUT CARDIOVERSION  N/A 11/04/2018   Procedure: TRANSESOPHAGEAL ECHOCARDIOGRAM (TEE);  Surgeon: Wonda Olds, MD;  Location: [facility];  Service: Open Heart Surgery;  Laterality: N/A;     reports that she has never smoked. She has never used smokeless tobacco. She reports that she does not currently use drugs after having used the following drugs: Marijuana. She reports that she does not drink alcohol.  Allergies  Allergen Reactions   Atorvastatin Other (See Comments)    Myalgias on '80mg'$  dose, tolerates '20mg'$  ok   Crestor [Rosuvastatin Calcium] Other (See Comments)    Severe myalgias and joint pain. Has tolerated atorvastatin though   Monosodium Glutamate Nausea And Vomiting and Other (See Comments)    migraine   Peanut-Containing Drug Products Other (See Comments)    Vomiting, upset stomach and some wheezing   Zetia [Ezetimibe] Other (See Comments)    myalgias   Ciprofloxin Hcl [Ciprofloxacin] Nausea And Vomiting and Other (See Comments)    Severe migraine   Food Color Red [Red Dye] Diarrhea    Family History  Problem Relation Age of Onset   Coronary artery disease Father    Congestive Heart Failure Father    Asthma Father    Cancer Mother        T cell lymphoma    Prior to Admission medications   Medication Sig Start Date End Date Taking? Authorizing Provider  acetaminophen (TYLENOL) 325 MG tablet Take 650 mg by mouth daily as needed (couch/pain/fever).    [provider]  albuterol (VENTOLIN HFA) 108 (90 Base) MCG/ACT inhaler INHALE 2 PUFFS INTO THE LUNGS EVERY 6 (SIX) HOURS AS NEEDED FOR WHEEZING OR SHORTNESS OF BREATH. 08/02/20 08/02/21  Charlott Rakes, MD  Alirocumab (PRALUENT) 150 MG/ML SOAJ Inject 150 mg into the skin every 14 (fourteen) days. 12/23/19   [name], MD  Alpha-Lipoic Acid 100 MG TABS Take 100-200 tablets by mouth daily.    [provider]  aspirin 81 MG EC tablet Take 1 tablet (81 mg total) by mouth daily. Patient taking differently: Take 81 mg by  mouth at bedtime. 12/07/17   Amin, Jeanella Flattery, MD  b complex vitamins tablet Take 1 tablet by mouth daily.    [provider]  beclomethasone (QVAR) 40 MCG/ACT inhaler INHALE 2 PUFFS INTO THE LUNGS 2 (TWO) TIMES DAILY. 03/15/20 03/15/21  [name], NP  benzonatate (TESSALON) 200 MG capsule Take 1 capsule (200 mg total) by mouth 2 (two) times daily as needed for cough. 06/03/19   [name], NP  clopidogrel (PLAVIX) 75 MG tablet TAKE 1 TABLET BY MOUTH DAILY WITH BREAKFAST. 08/25/19   [name], MD  diclofenac Sodium (VOLTAREN) 1 % GEL Apply 2 g topically 4 (four) times daily. Patient taking differently: Apply 2 g topically daily as needed (For pain). 03/19/19   [name], NP  ferrous sulfate 325 (65 FE) MG tablet Take 325 mg by mouth See admin instructions. Take one tablet (325 mg) by mouth once or twice during menstrual cycle    [provider]  FLUoxetine (PROZAC) 40 MG capsule TAKE 1  CAPSULE BY MOUTH DAILY. 05/21/20 05/21/21  [name], NP  fluticasone (FLONASE) 50 MCG/ACT nasal spray Place 1 spray into both nostrils at bedtime as needed for allergies.    [provider]  hydrOXYzine (ATARAX/VISTARIL) 25 MG tablet Take 1 tablet (25 mg total) by mouth 3 (three) times daily as needed. Patient taking differently: Take 25 mg by mouth 3 (three) times daily as needed for anxiety. 03/19/19   [name], NP  Insulin Human (INSULIN PUMP) SOLN Inject into the skin continuous. Humalog    [provider]  insulin lispro (HUMALOG) 100 UNIT/ML injection Inject 35 units into the skin daily via pump 05/22/20 05/22/21  Renato Shin, MD  ipratropium (ATROVENT) 0.06 % nasal spray Place 2 sprays into both nostrils 2 (two) times daily as needed for rhinitis.    [provider]  levothyroxine (SYNTHROID) 100 MCG tablet TAKE 1 TABLET (100 MCG TOTAL) BY MOUTH DAILY BEFORE BREAKFAST. 11/17/19 11/16/20  Renato Shin, MD  LORazepam (ATIVAN) 0.5 MG  tablet Take 1 tablet (0.5 mg total) by mouth every 8 (eight) hours as needed for anxiety. 07/13/19   Ripley Fraise, MD  meloxicam (MOBIC) 15 MG tablet Take 15 mg by mouth daily as needed for pain.    [provider]  metoCLOPramide (REGLAN) 5 MG tablet Take 1 tablet (5 mg total) by mouth every 12 (twelve) hours as needed for nausea or vomiting. 12/17/18 12/17/19  Argentina Donovan, PA-C  montelukast (SINGULAIR) 10 MG tablet TAKE 1 TABLET (10 MG TOTAL) BY MOUTH AT BEDTIME. 09/03/20 09/03/21  [name], NP  nitroGLYCERIN (NITROSTAT) 0.4 MG SL tablet Place 1 tablet (0.4 mg total) under the tongue every 5 (five) minutes as needed for chest pain. 01/08/19   [name], MD  ondansetron (ZOFRAN-ODT) 4 MG disintegrating tablet TAKE 1 TABLET (4 MG TOTAL) BY MOUTH EVERY 8 (EIGHT) HOURS AS NEEDED FOR NAUSEA OR VOMITING. 02/27/20 02/26/21  [name], NP  pantoprazole (PROTONIX) 40 MG tablet Take 1 tablet (40 mg total) by mouth 2 (two) times daily as needed (acid reflux). Patient taking differently: Take 40 mg by mouth as needed. Pt taking as needed for indigestion/acid reflux. 06/03/19 08/18/19  [name], NP  Phenyleph-Doxyl-DM-Aspirin (ALKA-SELTZER PLUS COLD DY/NGHT PO) Take 1 tablet by mouth at bedtime as needed (cough).    [provider]    Physical Exam: Constitutional: Moderately built and nourished.   Vitals:   09/30/20 2345 10/01/20 0015 10/01/20 0045 10/01/20 0115  BP: (!) 101/59 (!) 94/55 (!) 86/54 (!) 94/57  Pulse: (!) 106 (!) 107 (!) 106 (!) 102  Resp:    18  Temp:      TempSrc:      SpO2: 98% 98% 98% 100%  Weight:      Height:       Eyes: Anicteric no pallor. ENMT: No discharge from the ears eyes nose and mouth. Neck: No mass felt.  No neck rigidity. Respiratory: No rhonchi or crepitations. Cardiovascular: S1-S2 heard. Abdomen: Soft nontender bowel sound present. Musculoskeletal: No edema. Skin: No rash. Neurologic: Alert awake oriented to time  place and person.  Moves all extremities. Psychiatric: Appears normal.  Normal affect.   Labs on Admission: I have personally reviewed following labs and imaging studies  CBC: Recent Labs  Lab 09/30/20 1658 09/30/20 1947 09/30/20 1948 10/01/20 0057  WBC 20.1*  --   --  24.9*  HGB 12.4 13.3 13.3 11.3*  HCT 37.6 39.0 39.0 33.6*  MCV 95.4  --   --  95.5  PLT 455*  --   --  [id number]*   Basic Metabolic Panel: Recent Labs  Lab 09/30/20 1658 09/30/20 1947 09/30/20 1948  NA 130* 126* 126*  K 4.8 6.1* 6.1*  CL 98 104  --   CO2 10*  --   --   GLUCOSE 550* >700*  --   BUN 21* 32*  --   CREATININE 1.67* 1.90*  --   CALCIUM 9.5  --   --    GFR: Estimated Creatinine Clearance: 31.5 mL/min (A) (by C-G formula based on SCr of 1.9 mg/dL (H)). Liver Function Tests: No results for input(s): AST, ALT, ALKPHOS, BILITOT, PROT, ALBUMIN in the last 168 hours. Recent Labs  Lab 09/30/20 1658  LIPASE 19   No results for input(s): AMMONIA in the last 168 hours. Coagulation Profile: No results for input(s): INR, PROTIME in the last 168 hours. Cardiac Enzymes: No results for input(s): CKTOTAL, CKMB, CKMBINDEX, TROPONINI in the last 168 hours. BNP (last 3 results) No results for input(s): PROBNP in the last 8760 hours. HbA1C: No results for input(s): HGBA1C in the last 72 hours. CBG: Recent Labs  Lab 09/30/20 2316 10/01/20 0015 10/01/20 0118 10/01/20 0234 10/01/20 0312  GLUCAP 454* 362* 266* 190* 136*   Lipid Profile: No results for input(s): CHOL, HDL, LDLCALC, TRIG, CHOLHDL, LDLDIRECT in the last 72 hours. Thyroid Function Tests: No results for input(s): TSH, T4TOTAL, FREET4, T3FREE, THYROIDAB in the last 72 hours. Anemia Panel: No results for input(s): VITAMINB12, FOLATE, FERRITIN, TIBC, IRON, RETICCTPCT in the last 72 hours. Urine analysis:    Component Value Date/Time   COLORURINE YELLOW 10/01/2020 0049   APPEARANCEUR CLEAR 10/01/2020 0049   LABSPEC 1.010 10/01/2020 0049    PHURINE 6.0 10/01/2020 0049   GLUCOSEU >=500 (A) 10/01/2020 0049   GLUCOSEU NEGATIVE 11/23/2015 0831   HGBUR MODERATE (A) 10/01/2020 0049   BILIRUBINUR SMALL (A) 10/01/2020 0049   BILIRUBINUR neg 12/07/2015 0843   KETONESUR >80 (A) 10/01/2020 0049   PROTEINUR NEGATIVE 10/01/2020 0049   UROBILINOGEN 1.0 12/07/2015 0843   UROBILINOGEN 0.2 11/23/2015 0831   NITRITE NEGATIVE 10/01/2020 0049   LEUKOCYTESUR NEGATIVE 10/01/2020 0049   Sepsis Labs: '@LABRCNTIP'$ (procalcitonin:4,lacticidven:4) ) Recent Results (from the past 240 hour(s))  Resp Panel by RT-PCR (Flu A&B, Covid) Nasopharyngeal Swab     Status: None   Collection Time: 09/30/20  6:55 PM   Specimen: Nasopharyngeal Swab; Nasopharyngeal(NP) swabs in vial transport medium  Result Value Ref Range Status   SARS Coronavirus 2 by RT PCR NEGATIVE NEGATIVE Final    Comment: (NOTE) SARS-CoV-2 target nucleic acids are NOT DETECTED.  The SARS-CoV-2 RNA is generally detectable in upper respiratory specimens during the acute phase of infection. The lowest concentration of SARS-CoV-2 viral copies this assay can detect is 138 copies/mL. A negative result does not preclude SARS-Cov-2 infection and should not be used as the sole basis for treatment or other patient management decisions. A negative result may occur with  improper specimen collection/handling, submission of specimen other than nasopharyngeal swab, presence of viral mutation(s) within the areas targeted by this assay, and inadequate number of viral copies(<138 copies/mL). A negative result must be combined with clinical observations, patient history, and epidemiological information. The expected result is Negative.  Fact Sheet for Patients:  EntrepreneurPulse.com.au  Fact Sheet for Healthcare Providers:  IncredibleEmployment.be  This test is no t yet approved or cleared by the [place] and  has been authorized  for detection and/or  diagnosis of SARS-CoV-2 by FDA under an Emergency Use Authorization (EUA). This EUA will remain  in effect (meaning this test can be used) for the duration of the COVID-19 declaration under Section 564(b)(1) of the Act, 21 U.S.C.section 360bbb-3(b)(1), unless the authorization is terminated  or revoked sooner.       Influenza A by PCR NEGATIVE NEGATIVE Final   Influenza B by PCR NEGATIVE NEGATIVE Final    Comment: (NOTE) The Xpert Xpress SARS-CoV-2/FLU/RSV plus assay is intended as an aid in the diagnosis of influenza from Nasopharyngeal swab specimens and should not be used as a sole basis for treatment. Nasal washings and aspirates are unacceptable for Xpert Xpress SARS-CoV-2/FLU/RSV testing.  Fact Sheet for Patients: EntrepreneurPulse.com.au  Fact Sheet for Healthcare Providers: IncredibleEmployment.be  This test is not yet approved or cleared by the [place] FDA and has been authorized for detection and/or diagnosis of SARS-CoV-2 by FDA under an Emergency Use Authorization (EUA). This EUA will remain in effect (meaning this test can be used) for the duration of the COVID-19 declaration under Section 564(b)(1) of the Act, 21 U.S.C. section 360bbb-3(b)(1), unless the authorization is terminated or revoked.  Performed at [facility], [place] [address], [place], [place] [address]      Radiological Exams on Admission: DG Chest 2 View  Result Date: 09/30/2020 CLINICAL DATA:  Chest pain and shortness of breath. EXAM: CHEST - 2 VIEW COMPARISON:  03/02/2020, CT 06/06/2019 FINDINGS: Patient is post median sternotomy.The cardiomediastinal contours are normal. Coronary stents are seen. Mild diffuse bronchial wall thickening with borderline hyperinflation. Pulmonary vasculature is normal. No consolidation, pleural effusion, or pneumothorax. No acute osseous abnormalities are seen. IMPRESSION: Mild diffuse bronchial wall thickening with  borderline hyperinflation, can be seen with bronchitis or asthma. Electronically Signed   By: Keith Rake M.D.   On: 09/30/2020 17:42   CT Angio Chest PE W and/or Wo Contrast  Result Date: 09/30/2020 CLINICAL DATA:  PE suspected, high prob Chest pain and shortness of breath. EXAM: CT ANGIOGRAPHY CHEST WITH CONTRAST TECHNIQUE: Multidetector CT imaging of the chest was performed using the standard protocol during bolus administration of intravenous contrast. Multiplanar CT image reconstructions and MIPs were obtained to evaluate the vascular anatomy. CONTRAST:  152m OMNIPAQUE IOHEXOL 350 MG/ML SOLN COMPARISON:  Radiograph earlier today.  Chest CTA 06/06/2019 FINDINGS: Cardiovascular: There are no filling defects within the pulmonary arteries to suggest pulmonary embolus. Normal caliber thoracic aorta. Post CABG with calcification of native coronary arteries. The heart is normal in size. No pericardial effusion. Mediastinum/Nodes: No enlarged mediastinal lymph nodes. Few scattered bilateral hilar nodes are not enlarged by size criteria. Small hiatal hernia. Lungs/Pleura: Patchy areas of ground-glass opacity throughout both lungs in a peripheral and upper lobe predominant distribution. Moderate central bronchial thickening. No pleural effusion. No pulmonary nodule or mass. Upper Abdomen: Assessed on concurrent abdominal CT, reported separately. Musculoskeletal: Post median sternotomy. Thoracic spondylosis with midthoracic There are no acute or suspicious osseous abnormalities. degenerative disc disease. Review of the MIP images confirms the above findings. IMPRESSION: 1. No pulmonary embolus. 2. Patchy areas of ground-glass opacity throughout both lungs in a peripheral and upper lobe predominant distribution. Findings may represent pneumonia, including COVID-19. Possibility of eosinophilic pneumonia or interstitial pneumonias are also considered. 3. Moderate central bronchial thickening, can be seen with  bronchitis or reactive airways disease. 4. Small hiatal hernia. Electronically Signed   By: MKeith RakeM.D.   On: 09/30/2020 21:58   CT  ABDOMEN PELVIS W CONTRAST  Result Date: 09/30/2020 CLINICAL DATA:  Abdominal distension. EXAM: CT ABDOMEN AND PELVIS WITH CONTRAST TECHNIQUE: Multidetector CT imaging of the abdomen and pelvis was performed using the standard protocol following bolus administration of intravenous contrast. CONTRAST:  154m OMNIPAQUE IOHEXOL 350 MG/ML SOLN COMPARISON:  Assessed on chest CTA performed concurrently. FINDINGS: Lower chest: Abdominopelvic CT 07/14/2019 Hepatobiliary: Mild hepatic steatosis. No evidence of focal liver lesion. Gallbladder physiologically distended, no calcified stone. No biliary dilatation. Pancreas: No ductal dilatation or inflammation. Spleen: Normal in size without focal abnormality. Adrenals/Urinary Tract: Normal adrenal glands. No hydronephrosis or perinephric edema. Homogeneous renal enhancement. No evidence of focal lesion or stone. Urinary bladder is distended without wall thickening. Stomach/Bowel: Small hiatal hernia. Stomach is partially distended, unremarkable. There is no small bowel obstruction or inflammation. Appendectomy. The colon is near completely decompressed and not well assessed, no obvious pericolonic edema. Vascular/Lymphatic: Normal caliber abdominal aorta. Minimal aortic atherosclerosis. Patent portal vein. No enlarged lymph nodes in the abdomen or pelvis. Reproductive: Uterus and bilateral adnexa are unremarkable. Other: No free air, free fluid, or intra-abdominal fluid collection. Musculoskeletal: Degenerative disc disease at L3-L4 and L4-L5 with Modic endplate changes and vacuum phenomena. Stable appearance from prior exam. There are no acute or suspicious osseous abnormalities. IMPRESSION: 1. No acute abnormality in the abdomen/pelvis. 2. Mild hepatic steatosis. 3. Small hiatal hernia. Aortic Atherosclerosis (ICD10-I70.0).  Electronically Signed   By: MKeith RakeM.D.   On: 09/30/2020 22:03    EKG: Independently reviewed.  Sinus tachycardia with nonspecific ST changes.  Assessment/Plan Active Problems:   DKA (diabetic ketoacidosis) ([facility]   [place], type 1 ([facility]   ARF (acute renal failure) ([facility])    Diabetes ketoacidosis likely from patient's insulin pump getting detached.  Could also be precipitated by possible pneumonia.  Check hemoglobin A1c.  Presently on fluids and IV insulin infusion follow metabolic panel closely until anion gap gets corrected. Possible pneumonia with likely developing sepsis on empiric antibiotics.  Other differentials include eosinophilic pneumonia and interstitial pneumonia.  Presently not hypoxic. Acute renal failure creatinine has worsened from 1.31 July 2020 it is around 1.6 and likely from vomiting.  I think it will improve with fluids. Nausea vomiting could be from DKA.  CT abdomen pelvis was unremarkable. History of hypothyroidism on Synthroid. CAD status post CABG and stenting presently chest pain-free but did have some chest pressure earlier.  Will trend cardiac markers.  Patient takes aspirin and Plavix.  Not on any beta-blockers or other medications. History of ischemic cardiomyopathy recent 2D echo showed improved EF.  Last echocardiogram on November 2020 showed EF of 55 to 60%.  Appears compensated. History of anxiety/depression on Prozac.  Since patient has possible delving sepsis with DKA and pneumonia will need close monitoring and inpatient status.   DVT prophylaxis: Lovenox. Code Status: Full code. Family Communication: Discussed with patient. Disposition Plan: Home. Consults called: Cardiology. Admission status: Inpatient.   ARise PatienceMD [facility] Pager [phone]  If 7PM-7AM, please contact night-coverage [email or web address] Password [facility] 10/01/2020, 3:33 AM

## 2020-10-01 NOTE — Plan of Care (Signed)
  Problem: Education: Goal: Knowledge of General Education information will improve Description: Including pain rating scale, medication(s)/side effects and non-pharmacologic comfort measures Outcome: Progressing   Problem: Health Behavior/Discharge Planning: Goal: Ability to manage health-related needs will improve Outcome: Progressing   Problem: Clinical Measurements: Goal: Ability to maintain clinical measurements within normal limits will improve Outcome: Progressing Goal: Will remain free from infection Outcome: Progressing Goal: Diagnostic test results will improve Outcome: Progressing Goal: Respiratory complications will improve Outcome: Progressing Goal: Cardiovascular complication will be avoided Outcome: Progressing   Problem: Activity: Goal: Risk for activity intolerance will decrease Outcome: Progressing   Problem: Nutrition: Goal: Adequate nutrition will be maintained Outcome: Progressing   Problem: Coping: Goal: Level of anxiety will decrease Outcome: Progressing   Problem: Elimination: Goal: Will not experience complications related to bowel motility Outcome: Progressing Goal: Will not experience complications related to urinary retention Outcome: Progressing   Problem: Pain Managment: Goal: General experience of comfort will improve Outcome: Progressing   Problem: Safety: Goal: Ability to remain free from injury will improve Outcome: Progressing   Problem: Skin Integrity: Goal: Risk for impaired skin integrity will decrease Outcome: Progressing   Problem: Education: Goal: Ability to describe self-care measures that may prevent or decrease complications (Diabetes Survival Skills Education) will improve Outcome: Progressing Goal: Individualized Educational Video(s) Outcome: Progressing   Problem: Coping: Goal: Ability to adjust to condition or change in health will improve Outcome: Progressing   Problem: Health Behavior/Discharge  Planning: Goal: Ability to manage health-related needs will improve Outcome: Progressing   Problem: Metabolic: Goal: Ability to maintain appropriate glucose levels will improve Outcome: Progressing   Problem: Nutritional: Goal: Maintenance of adequate nutrition will improve Outcome: Progressing Goal: Progress toward achieving an optimal weight will improve Outcome: Progressing   Problem: Skin Integrity: Goal: Risk for impaired skin integrity will decrease Outcome: Progressing   Problem: Tissue Perfusion: Goal: Adequacy of tissue perfusion will improve Outcome: Progressing

## 2020-10-01 NOTE — Plan of Care (Signed)
Patient is added to cardiac cath board on Monday per Dr Stanford Breed, will need to re-assess DKA and stability, if stable, will need pre-cath orders placed before Monday.

## 2020-10-01 NOTE — Progress Notes (Signed)
   10/01/20 1737  Assess: MEWS Score  Pulse Rate (!) 103  ECG Heart Rate (!) 104  Resp (!) 33  SpO2 94 %  Assess: MEWS Score  MEWS Temp 0  MEWS Systolic 0  MEWS Pulse 1  MEWS RR 2  MEWS LOC 0  MEWS Score 3  MEWS Score Color Yellow  Assess: if the MEWS score is Yellow or Red  Were vital signs taken at a resting state? Yes  Focused Assessment No change from prior assessment  Early Detection of Sepsis Score *See Row Information* Low  MEWS guidelines implemented *See Row Information* No, previously yellow, continue vital signs every 4 hours  Treat  MEWS Interventions Escalated (See documentation below)  Pain Scale 0-10  Pain Score Asleep  Escalate  MEWS: Escalate Yellow: discuss with charge nurse/RN and consider discussing with provider and RRT  Notify: Charge Nurse/RN  Name of Charge Nurse/RN Notified Cheshenda  Notify: Provider  Provider Name/Title Elgergawy  Date Provider Notified 10/01/20  Time Provider Notified [phone]  Notification Type Page  Provider response See new orders  Document  Patient Outcome Other (Comment) (stable on the unit, cardiology to consult)  Progress note created (see row info) Yes

## 2020-10-01 NOTE — Plan of Care (Signed)
  Problem: Education: Goal: Knowledge of General Education information will improve Description: Including pain rating scale, medication(s)/side effects and non-pharmacologic comfort measures Outcome: Progressing   Problem: Health Behavior/Discharge Planning: Goal: Ability to manage health-related needs will improve Outcome: Progressing   Problem: Clinical Measurements: Goal: Ability to maintain clinical measurements within normal limits will improve Outcome: Progressing Goal: Will remain free from infection Outcome: Progressing Goal: Diagnostic test results will improve Outcome: Progressing Goal: Respiratory complications will improve Outcome: Progressing Goal: Cardiovascular complication will be avoided Outcome: Progressing   Problem: Nutrition: Goal: Adequate nutrition will be maintained Outcome: Progressing   Problem: Activity: Goal: Risk for activity intolerance will decrease Outcome: Progressing   Problem: Coping: Goal: Level of anxiety will decrease Outcome: Progressing   Problem: Elimination: Goal: Will not experience complications related to bowel motility Outcome: Progressing Goal: Will not experience complications related to urinary retention Outcome: Progressing   Problem: Pain Managment: Goal: General experience of comfort will improve Outcome: Progressing   Problem: Safety: Goal: Ability to remain free from injury will improve Outcome: Progressing   Problem: Skin Integrity: Goal: Risk for impaired skin integrity will decrease Outcome: Progressing   Problem: Education: Goal: Ability to describe self-care measures that may prevent or decrease complications (Diabetes Survival Skills Education) will improve Outcome: Progressing Goal: Individualized Educational Video(s) Outcome: Progressing   Problem: Coping: Goal: Ability to adjust to condition or change in health will improve Outcome: Progressing   Problem: Health Behavior/Discharge  Planning: Goal: Ability to manage health-related needs will improve Outcome: Progressing   Problem: Metabolic: Goal: Ability to maintain appropriate glucose levels will improve Outcome: Progressing   Problem: Nutritional: Goal: Maintenance of adequate nutrition will improve Outcome: Progressing Goal: Progress toward achieving an optimal weight will improve Outcome: Progressing   Problem: Skin Integrity: Goal: Risk for impaired skin integrity will decrease Outcome: Progressing   Problem: Tissue Perfusion: Goal: Adequacy of tissue perfusion will improve Outcome: Progressing

## 2020-10-01 NOTE — Progress Notes (Signed)
ANTICOAGULATION CONSULT NOTE - Initial Consult  Pharmacy Consult for IV Heparin Indication: chest pain/ACS  Allergies  Allergen Reactions   Atorvastatin Other (See Comments)    Myalgias on '80mg'$  dose, tolerates '20mg'$  ok   Crestor [Rosuvastatin Calcium] Other (See Comments)    Severe myalgias and joint pain. Has tolerated atorvastatin though   Monosodium Glutamate Nausea And Vomiting and Other (See Comments)    migraine   Peanut-Containing Drug Products Other (See Comments)    Vomiting, upset stomach and some wheezing   Zetia [Ezetimibe] Other (See Comments)    myalgias   Ciprofloxin Hcl [Ciprofloxacin] Nausea And Vomiting and Other (See Comments)    Severe migraine   Food Color Red [Red Dye] Diarrhea    Patient Measurements: Height: '5\' 5"'$  (165.1 cm) Weight: 62.2 kg (137 lb 2 oz) IBW/kg (Calculated) : 57 Heparin Dosing Weight: 62.2 kg  Vital Signs: Temp: 98.5 F (36.9 C) (09/09 1638) Temp Source: Oral (09/09 1638) BP: 130/78 (09/09 1700) Pulse Rate: 103 (09/09 1737)  Labs: Recent Labs    09/30/20 1658 09/30/20 1853 09/30/20 1947 09/30/20 1948 10/01/20 0057 10/01/20 1553  HGB 12.4  --  13.3 13.3 11.3*  --   HCT 37.6  --  39.0 39.0 33.6*  --   PLT 455*  --   --   --  406*  --   CREATININE 1.67*  --  1.90*  --   --  1.26*  TROPONINIHS 61* 54*  --   --   --  >24,000*    Estimated Creatinine Clearance: 47.5 mL/min (A) (by C-G formula based on SCr of 1.26 mg/dL (H)).   Medical History: Past Medical History:  Diagnosis Date   Anemia    Anxiety    Coronary artery disease    a. s/p PTCA DES x3 in the LAD (ostial DES placed, mid DES placed, distal DES placed) and PTCA/DES x1 to intermediate branch 02/2017. b. ACS 02/08/18 with LCx stent placement.   Depression    Diabetes mellitus    type 1, on insulin pump   DKA (diabetic ketoacidoses) 12/31/2017   Elevated platelet count    High cholesterol    Hypothyroidism    Ischemic cardiomyopathy    a. EF low-normal with  basal inferior akinesis, basal septal hypokinesis by echo 01/2018.    Medications:  Scheduled:   aspirin EC  81 mg Oral Daily   budesonide  0.25 mg Nebulization BID   clopidogrel  75 mg Oral Daily   FLUoxetine  40 mg Oral Daily   levothyroxine  100 mcg Oral Q0600   metoprolol tartrate  12.5 mg Oral BID   Infusions:   azithromycin     cefTRIAXone (ROCEPHIN)  IV Stopped (10/01/20 0828)   dextrose 5% lactated ringers 125 mL/hr at 10/01/20 1718   insulin 0.2 Units/hr (10/01/20 1752)   lactated ringers     lactated ringers     sodium chloride      Assessment: 50 years of age female with high sensitivity troponin of >24,000 to start IV Heparin for ACS. Hgb 11.3, Hct 33.6, Platelets 406. No bleeding reported. Plan for cath on Monday. Patient received Lovenox dose at 10 AM this morning. Patient remains on ASA and Plavix per Cardiology plans. Not on anticoagulation prior to admission. Patient is noted to be in DKA on insulin drip with AKI, SCr trending down since admission at 1.26 currently.   Goal of Therapy:  Heparin level 0.3-0.7 units/ml Monitor platelets by anticoagulation protocol: Yes  Plan:  Heparin 3700 units IV x1 then 750 units/hr.  Heparin level in 6 hours.  Daily Heparin level and CBC while on therapy.   Sloan Leiter, PharmD, BCPS, BCCCP Clinical Pharmacist Please refer to [facility] for [place] numbers 10/01/2020,6:30 PM

## 2020-10-02 ENCOUNTER — Inpatient Hospital Stay (HOSPITAL_COMMUNITY): Payer: Self-pay

## 2020-10-02 DIAGNOSIS — J9601 Acute respiratory failure with hypoxia: Secondary | ICD-10-CM

## 2020-10-02 DIAGNOSIS — R0603 Acute respiratory distress: Secondary | ICD-10-CM

## 2020-10-02 DIAGNOSIS — I214 Non-ST elevation (NSTEMI) myocardial infarction: Secondary | ICD-10-CM

## 2020-10-02 DIAGNOSIS — I5021 Acute systolic (congestive) heart failure: Secondary | ICD-10-CM

## 2020-10-02 LAB — GLUCOSE, CAPILLARY
Glucose-Capillary: 162 mg/dL — ABNORMAL HIGH (ref 70–99)
Glucose-Capillary: 148 mg/dL — ABNORMAL HIGH (ref 70–99)
Glucose-Capillary: 126 mg/dL — ABNORMAL HIGH (ref 70–99)
Glucose-Capillary: 152 mg/dL — ABNORMAL HIGH (ref 70–99)
Glucose-Capillary: 194 mg/dL — ABNORMAL HIGH (ref 70–99)
Glucose-Capillary: 168 mg/dL — ABNORMAL HIGH (ref 70–99)
Glucose-Capillary: 223 mg/dL — ABNORMAL HIGH (ref 70–99)
Glucose-Capillary: 192 mg/dL — ABNORMAL HIGH (ref 70–99)
Glucose-Capillary: 176 mg/dL — ABNORMAL HIGH (ref 70–99)
Glucose-Capillary: 194 mg/dL — ABNORMAL HIGH (ref 70–99)
Glucose-Capillary: 219 mg/dL — ABNORMAL HIGH (ref 70–99)
Glucose-Capillary: 198 mg/dL — ABNORMAL HIGH (ref 70–99)
Glucose-Capillary: 152 mg/dL — ABNORMAL HIGH (ref 70–99)
Glucose-Capillary: 132 mg/dL — ABNORMAL HIGH (ref 70–99)
Glucose-Capillary: 154 mg/dL — ABNORMAL HIGH (ref 70–99)
Glucose-Capillary: 139 mg/dL — ABNORMAL HIGH (ref 70–99)
Glucose-Capillary: 151 mg/dL — ABNORMAL HIGH (ref 70–99)
Glucose-Capillary: 152 mg/dL — ABNORMAL HIGH (ref 70–99)
Glucose-Capillary: 150 mg/dL — ABNORMAL HIGH (ref 70–99)
Glucose-Capillary: 161 mg/dL — ABNORMAL HIGH (ref 70–99)

## 2020-10-02 LAB — BASIC METABOLIC PANEL
Anion gap: 7 (ref 5–15)
BUN: 20 mg/dL (ref 6–20)
CO2: 19 mmol/L — ABNORMAL LOW (ref 22–32)
Calcium: 8.1 mg/dL — ABNORMAL LOW (ref 8.9–10.3)
Chloride: 111 mmol/L (ref 98–111)
Creatinine, Ser: 0.92 mg/dL (ref 0.44–1.00)
GFR, Estimated: >60 mL/min (ref >60)
Glucose, Bld: 130 mg/dL — ABNORMAL HIGH (ref 70–99)
Potassium: 3.6 mmol/L (ref 3.5–5.1)
Sodium: 137 mmol/L (ref 135–145)

## 2020-10-02 LAB — ECHOCARDIOGRAM COMPLETE
Area-P 1/2: 5.38 cm2
Height: 65 in
S' Lateral: 3.7 cm
Single Plane A4C EF: 24.8 %
Weight: 2194.02 oz

## 2020-10-02 LAB — HEPARIN LEVEL (UNFRACTIONATED)
Heparin Unfractionated: 0.34 IU/mL (ref 0.30–0.70)
Heparin Unfractionated: <0.1 IU/mL — ABNORMAL LOW (ref 0.30–0.70)
Heparin Unfractionated: 0.26 IU/mL — ABNORMAL LOW (ref 0.30–0.70)

## 2020-10-02 LAB — BETA-HYDROXYBUTYRIC ACID: Beta-Hydroxybutyric Acid: 0.72 mmol/L — ABNORMAL HIGH (ref 0.05–0.27)

## 2020-10-02 MED ORDER — FUROSEMIDE 10 MG/ML IJ SOLN
40.0000 mg | INTRAMUSCULAR | Status: AC
  Administered 2020-10-02: 40 mg via INTRAVENOUS

## 2020-10-02 MED ORDER — INSULIN REGULAR(HUMAN) IN NACL 100-0.9 UT/100ML-% IV SOLN
INTRAVENOUS | Status: DC
  Administered 2020-10-02: 2.8 [IU]/h via INTRAVENOUS
  Administered 2020-10-02: 1.7 [IU]/h via INTRAVENOUS

## 2020-10-02 MED ORDER — LABETALOL HCL 5 MG/ML IV SOLN
10.0000 mg | Freq: Four times a day (QID) | INTRAVENOUS | Status: DC | PRN
  Administered 2020-10-02: 10 mg via INTRAVENOUS

## 2020-10-02 MED ORDER — HYDROXYZINE HCL 25 MG PO TABS
25.0000 mg | ORAL_TABLET | Freq: Three times a day (TID) | ORAL | Status: DC | PRN
  Administered 2020-10-02 – 2020-10-04 (×4): 25 mg via ORAL
  Filled 2020-10-02 (×4): qty 1

## 2020-10-02 MED ORDER — FUROSEMIDE 10 MG/ML IJ SOLN
INTRAMUSCULAR | Status: AC
  Filled 2020-10-02: qty 4

## 2020-10-02 MED ORDER — LABETALOL HCL 5 MG/ML IV SOLN
INTRAVENOUS | Status: AC
  Filled 2020-10-02: qty 4

## 2020-10-02 MED ORDER — INSULIN REGULAR(HUMAN) IN NACL 100-0.9 UT/100ML-% IV SOLN: INTRAVENOUS | Status: DC

## 2020-10-02 MED ORDER — LACTATED RINGERS IV BOLUS
1000.0000 mL | Freq: Once | INTRAVENOUS | Status: AC
  Administered 2020-10-02: 1000 mL via INTRAVENOUS

## 2020-10-02 MED ORDER — FUROSEMIDE 10 MG/ML IJ SOLN
20.0000 mg | Freq: Once | INTRAMUSCULAR | Status: DC
  Filled 2020-10-02 (×2): qty 2

## 2020-10-02 MED ORDER — DEXTROSE 50 % IV SOLN: 0.0000 mL | INTRAVENOUS | Status: DC | PRN

## 2020-10-02 MED ORDER — PANTOPRAZOLE SODIUM 40 MG PO TBEC
40.0000 mg | DELAYED_RELEASE_TABLET | Freq: Every day | ORAL | Status: DC
  Administered 2020-10-02 – 2020-10-06 (×5): 40 mg via ORAL
  Filled 2020-10-02 (×5): qty 1

## 2020-10-02 MED ORDER — MORPHINE SULFATE (PF) 2 MG/ML IV SOLN
2.0000 mg | Freq: Once | INTRAVENOUS | Status: AC
  Administered 2020-10-02: 2 mg via INTRAVENOUS

## 2020-10-02 MED ORDER — INSULIN DETEMIR 100 UNIT/ML ~~LOC~~ SOLN
5.0000 [IU] | Freq: Two times a day (BID) | SUBCUTANEOUS | Status: DC
  Administered 2020-10-02 – 2020-10-03 (×2): 5 [IU] via SUBCUTANEOUS
  Filled 2020-10-02 (×3): qty 0.05

## 2020-10-02 MED ORDER — FUROSEMIDE 10 MG/ML IJ SOLN
60.0000 mg | INTRAMUSCULAR | Status: AC
  Administered 2020-10-02: 60 mg via INTRAVENOUS

## 2020-10-02 MED ORDER — INSULIN PUMP
SUBCUTANEOUS | Status: DC
  Filled 2020-10-02: qty 1

## 2020-10-02 MED ORDER — INSULIN ASPART 100 UNIT/ML IJ SOLN
2.0000 [IU] | INTRAMUSCULAR | Status: DC
  Administered 2020-10-03 (×2): 2 [IU] via SUBCUTANEOUS
  Administered 2020-10-03 (×2): 4 [IU] via SUBCUTANEOUS
  Administered 2020-10-03: 6 [IU] via SUBCUTANEOUS
  Administered 2020-10-03: 2 [IU] via SUBCUTANEOUS
  Administered 2020-10-04: 4 [IU] via SUBCUTANEOUS

## 2020-10-02 MED ORDER — HEPARIN BOLUS VIA INFUSION
2000.0000 [IU] | Freq: Once | INTRAVENOUS | Status: AC
  Administered 2020-10-02: 2000 [IU] via INTRAVENOUS
  Filled 2020-10-02: qty 2000

## 2020-10-02 MED ORDER — SUCRALFATE 1 GM/10ML PO SUSP
1.0000 g | Freq: Three times a day (TID) | ORAL | Status: DC
  Administered 2020-10-02 – 2020-10-06 (×13): 1 g via ORAL
  Filled 2020-10-02 (×13): qty 10

## 2020-10-02 MED ORDER — MORPHINE SULFATE (PF) 2 MG/ML IV SOLN
INTRAVENOUS | Status: AC
  Filled 2020-10-02: qty 1

## 2020-10-02 MED ORDER — CHLORHEXIDINE GLUCONATE CLOTH 2 % EX PADS
6.0000 | MEDICATED_PAD | Freq: Every day | CUTANEOUS | Status: DC
  Administered 2020-10-02 – 2020-10-06 (×5): 6 via TOPICAL

## 2020-10-02 NOTE — Progress Notes (Addendum)
[place] for IV Heparin Indication: chest pain/ACS  Allergies  Allergen Reactions   Atorvastatin Other (See Comments)    Myalgias on '80mg'$  dose, tolerates '20mg'$  ok   Crestor [Rosuvastatin Calcium] Other (See Comments)    Severe myalgias and joint pain. Has tolerated atorvastatin though   Monosodium Glutamate Nausea And Vomiting and Other (See Comments)    migraine   Peanut-Containing Drug Products Other (See Comments)    Vomiting, upset stomach and some wheezing   Zetia [Ezetimibe] Other (See Comments)    myalgias   Ciprofloxin Hcl [Ciprofloxacin] Nausea And Vomiting and Other (See Comments)    Severe migraine   Food Color Red [Red Dye] Diarrhea    Patient Measurements: Height: '5\' 5"'$  (165.1 cm) Weight: 62.2 kg (137 lb 2 oz) IBW/kg (Calculated) : 57 Heparin Dosing Weight: 62.2 kg  Vital Signs: Temp: 98.2 F (36.8 C) (09/10 0302) Temp Source: Oral (09/10 0302) BP: 93/60 (09/10 0600) Pulse Rate: 89 (09/10 0600)  Labs: Recent Labs    09/30/20 1658 09/30/20 1853 09/30/20 1947 09/30/20 1948 10/01/20 0057 10/01/20 1553 10/01/20 1911 10/01/20 2203 10/02/20 0443  HGB 12.4  --  13.3 13.3 11.3*  --   --   --   --   HCT 37.6  --  39.0 39.0 33.6*  --   --   --   --   PLT 455*  --   --   --  406*  --   --   --   --   HEPARINUNFRC  --   --   --   --   --   --   --   --  0.34  CREATININE 1.67*  --  1.90*  --   --  1.26* 1.12* 1.10* 0.92  TROPONINIHS 61* 54*  --   --   --  >24,000*  --   --   --      Estimated Creatinine Clearance: 65.1 mL/min (by C-G formula based on SCr of 0.92 mg/dL).   Medical History: Past Medical History:  Diagnosis Date   Anemia    Anxiety    Coronary artery disease    a. s/p PTCA DES x3 in the LAD (ostial DES placed, mid DES placed, distal DES placed) and PTCA/DES x1 to intermediate branch 02/2017. b. ACS 02/08/18 with LCx stent placement.   Depression    Diabetes mellitus    type 1, on insulin pump   DKA  (diabetic ketoacidoses) 12/31/2017   Elevated platelet count    High cholesterol    Hypothyroidism    Ischemic cardiomyopathy    a. EF low-normal with basal inferior akinesis, basal septal hypokinesis by echo 01/2018.    Medications:  Scheduled:   aspirin EC  81 mg Oral Daily   budesonide  0.25 mg Nebulization BID   clopidogrel  75 mg Oral Daily   FLUoxetine  40 mg Oral Daily   levothyroxine  100 mcg Oral Q0600   metoprolol tartrate  12.5 mg Oral BID   pantoprazole  40 mg Oral Daily   Infusions:   azithromycin 250 mL/hr at 10/02/20 0350   cefTRIAXone (ROCEPHIN)  IV Stopped (10/01/20 [id number])   dextrose 5% lactated ringers 1,000 mL (10/02/20 0805)   heparin 750 Units/hr (10/02/20 0546)   insulin 2.8 Units/hr (10/02/20 0821)   lactated ringers     lactated ringers     sodium chloride      Assessment: 50 years of age female with  high sensitivity troponin of >24,000 on IV Heparin for ACS; history hx CAD status post stenting (2019) and CABG (2020).  Not on anticoagulation prior to admission. Patient is noted to be in DKA on insulin drip.  -heparin level now < 0.1. No infusion interruptions or line concerns noted per RN  Goal of Therapy:  Heparin level 0.3-0.7 units/ml Monitor platelets by anticoagulation protocol: Yes   Plan:  -Heparin 2000 units x1 and increase infusion to 900 units/hr -Heparin level in 6 hours and daily wth CBC daily   Hildred Laser, PharmD Clinical Pharmacist **Pharmacist phone directory can now be found on [email or web address] ([email or web address]).  Listed under [place].

## 2020-10-02 NOTE — Plan of Care (Signed)

## 2020-10-02 NOTE — Progress Notes (Addendum)
Inpatient Diabetes Program Recommendations  AACE/ADA: New Consensus Statement on Inpatient Glycemic Control (2015)  Target Ranges:  Prepandial:   less than 140 mg/dL      Peak postprandial:   less than 180 mg/dL (1-2 hours)      Critically ill patients:  140 - 180 mg/dL   Lab Results  Component Value Date   GLUCAP 192 (H) 10/02/2020   HGBA1C 8.5 (H) 10/01/2020    Review of Glycemic Control Results for TAMELLA, ADDAMS (MRN [id number]) as of 10/02/2020 09:00  Ref. Range 10/02/2020 06:03 10/02/2020 07:00 10/02/2020 08:10  Glucose-Capillary Latest Ref Range: 70 - 99 mg/dL 194 (H) 168 (H) 223 (H)   Diabetes history: DM 1 Outpatient Diabetes medications: insulin pump with Humalog basal rate of 0.7 units/hr, 6 AM-10 AM, 0.9 units/hr, 10 AM-10 PM, and 0.625 units/hr overnight (when not in "auto mode").  Total of 16.8 units of basal insulin in a 24 hour period bolus of 1 unit/16 grams CHO. correction bolus (which some people call "sensitivity," or "insulin sensitivity ratio," or just "isr") of 1 unit for each 75 by which your glucose exceeds 100.   Sees Renato Shin, Endocrinology Current orders for Inpatient glycemic control:  IV insulin  Inpatient Diabetes Program Recommendations:    Consider transition off insulin drip to insulin pump today.  Will need to overlap insulin drip and insulin pump for 1 hour.  Patient has supplies for insulin pump but does not have insulin.  Will need to order vial from pharmacy.  Will follow.   Thanks,  Adah Perl, RN, BC-ADM Inpatient Diabetes Coordinator Pager [phone]  (8a-5p)  9/10- Spoke with patient- her medtronic insulin pump has a sensor that sync's with insulin pump however she does not have it at this time.  She would like to have blood sugars checked every 2-4 hours until she gets her sensor that goes with her insulin pump back tomorrow.  Alerted MD and RN.

## 2020-10-02 NOTE — Consult Note (Signed)
NAME:  Jennifer Hogan, MRN:  [id number], DOB:  01/20/1971, LOS: 2 ADMISSION DATE:  09/30/2020, CONSULTATION DATE: 10/02/2020 REFERRING MD:  Albertine Patricia, MD , CHIEF COMPLAINT: Shortness of breath  History of Present Illness:  50 year old female with type 1 diabetes on insulin pump, coronary artery disease status post stents and CABG who was admitted initially with diabetic ketoacidosis due to malfunctioning insulin pump, she was started on DKA protocol with IV fluid and insulin infusion.  During her stay she was noted to have elevated serum troponin with EKG changes, cardiology was consulted, diagnosed with acute non-ST elevation MI, was started on IV heparin infusion.  This morning patient was noted to be short of breath and hypoxic with O2 sat dropped down to 70s, she was put on nonrebreather mask, rapid response was called and CCM was consulted for evaluation.  During my evaluation patient is on BiPAP, tachypneic O2 sat is at 97% on 60% FiO2.  She did complain of shortness of breath but denies chest pain, palpitation, headache, fever, chills or other complaints  Pertinent  Medical History   Past Medical History:  Diagnosis Date   Anemia    Anxiety    Coronary artery disease    a. s/p PTCA DES x3 in the LAD (ostial DES placed, mid DES placed, distal DES placed) and PTCA/DES x1 to intermediate branch 02/2017. b. ACS 02/08/18 with LCx stent placement.   Depression    Diabetes mellitus    type 1, on insulin pump   DKA (diabetic ketoacidoses) 12/31/2017   Elevated platelet count    High cholesterol    Hypothyroidism    Ischemic cardiomyopathy    a. EF low-normal with basal inferior akinesis, basal septal hypokinesis by echo 01/2018.     Significant Hospital Events: Including procedures, antibiotic start and stop dates in addition to other pertinent events   9/9 admitted with DKA 9/10 consulted and was transferred to ICU for respiratory distress  Interim History / Subjective:     Objective   Blood pressure 105/68, pulse (!) 117, temperature 98.9 F (37.2 C), temperature source Oral, resp. rate (!) 33, height '5\' 5"'$  (1.651 m), weight 62.2 kg, SpO2 99 %.    FiO2 (%):  [28 %] 28 %   Intake/Output Summary (Last 24 hours) at 10/02/2020 1306 Last data filed at 10/02/2020 0548 Gross per 24 hour  Intake 5085.07 ml  Output 900 ml  Net 4185.07 ml   Filed Weights   09/30/20 1646 10/01/20 1530  Weight: 65.8 kg 62.2 kg    Examination:   Physical exam: General: Crtitically ill-appearing middle-aged Caucasian female, on BiPAP HEENT: /AT, eyes anicteric.  Moist mucous membranes, positive JVD Neuro: Alert, awake, following commands, moving all 4 extremity, fidgety Chest: Bilateral coarse crackles all over, no wheezes or rhonchi Heart: Tachycardic, regular rhythm, no murmur appreciated Abdomen: Soft, nontender, nondistended, bowel sounds present Skin: No rash   Resolved Hospital Problem list     Assessment & Plan:  Acute hypoxic respiratory failure due to flash pulmonary edema Coronary artery disease status post CABG, now with acute non-ST elevation MI Diabetic ketoacidosis Acute kidney injury Hypothyroidism Sepsis/pneumonia was ruled out Hypertension  Continue BiPAP for now, after few hours we will try to switch back to nasal cannula Discontinue IV fluid Patient received 60 mg of IV Lasix We will give her 40 mg x 1 Monitor intake output Cardiology is following Patient will have echocardiogram stat Continue aspirin, Plavix and IV heparin infusion Diabetic ketoacidosis has  resolved, patient's anion gap is closed She is n.p.o. for being on BiPAP We will continue with IV insulin infusion, once she comes off of BiPAP we will switch her to long-acting insulin Serum creatinine is back to baseline Continue Synthyroid I do not think patient has pneumonia, will stop antibiotics despite her white count is elevated but she is in distress due to NSTEMI and flash  pulmonary edema that might be reason for high white count Continue labetalol 10 mg every 6 hours as needed  Best Practice (right click and "Reselect all SmartList Selections" daily)   Diet/type: NPO DVT prophylaxis: systemic heparin GI prophylaxis: PPI Lines: N/A Foley:  Yes, and it is still needed Code Status:  full code Last date of multidisciplinary goals of care discussion [pending]  Labs   CBC: Recent Labs  Lab 09/30/20 1658 09/30/20 1947 09/30/20 1948 10/01/20 0057  WBC 20.1*  --   --  24.9*  HGB 12.4 13.3 13.3 11.3*  HCT 37.6 39.0 39.0 33.6*  MCV 95.4  --   --  95.5  PLT 455*  --   --  406*    Basic Metabolic Panel: Recent Labs  Lab 09/30/20 1658 09/30/20 1947 09/30/20 1948 10/01/20 1553 10/01/20 1911 10/01/20 2203 10/02/20 0443  NA 130* 126* 126* 133* 136 137 137  K 4.8 6.1* 6.1* 3.8 4.0 3.8 3.6  CL 98 104  --  107 106 109 111  CO2 10*  --   --  15* 15* 17* 19*  GLUCOSE 550* >700*  --  201* 189* 177* 130*  BUN 21* 32*  --  27* 26* 25* 20  CREATININE 1.67* 1.90*  --  1.26* 1.12* 1.10* 0.92  CALCIUM 9.5  --   --  8.7* 8.7* 8.3* 8.1*   GFR: Estimated Creatinine Clearance: 65.1 mL/min (by C-G formula based on SCr of 0.92 mg/dL). Recent Labs  Lab 09/30/20 1658 09/30/20 2000 10/01/20 0054 10/01/20 0057  WBC 20.1*  --   --  24.9*  LATICACIDVEN  --  6.3* 2.2*  --     Liver Function Tests: No results for input(s): AST, ALT, ALKPHOS, BILITOT, PROT, ALBUMIN in the last 168 hours. Recent Labs  Lab 09/30/20 1658  LIPASE 19   No results for input(s): AMMONIA in the last 168 hours.  ABG    Component Value Date/Time   PHART 7.327 (L) 11/04/2018 2133   PCO2ART 42.2 11/04/2018 2133   PO2ART 146.0 (H) 11/04/2018 2133   HCO3 6.6 (L) 09/30/2020 1948   TCO2 7 (L) 09/30/2020 1948   ACIDBASEDEF 20.0 (H) 09/30/2020 1948   O2SAT 88.0 09/30/2020 1948     Coagulation Profile: No results for input(s): INR, PROTIME in the last 168 hours.  Cardiac  Enzymes: No results for input(s): CKTOTAL, CKMB, CKMBINDEX, TROPONINI in the last 168 hours.  HbA1C: Hgb A1c MFr Bld  Date/Time Value Ref Range Status  10/01/2020 03:53 PM 8.5 (H) 4.8 - 5.6 % Final    Comment:    (NOTE) Pre diabetes:          5.7%-6.4%  Diabetes:              >6.4%  Glycemic control for   <7.0% adults with diabetes   07/30/2020 11:53 AM 9.2 (H) 4.8 - 5.6 % Final    Comment:             Prediabetes: 5.7 - 6.4          Diabetes: >6.4  Glycemic control for adults with diabetes: <7.0     CBG: Recent Labs  Lab 10/02/20 0810 10/02/20 0908 10/02/20 1012 10/02/20 1110 10/02/20 1204  GLUCAP 223* 192* 176* 194* 219*    Review of Systems:   12 point review of systems significant for complaint mentioned in the HPI, rest is negative  Past Medical History:  She,  has a past medical history of Anemia, Anxiety, Coronary artery disease, Depression, Diabetes mellitus, DKA (diabetic ketoacidoses) (12/31/2017), Elevated platelet count, High cholesterol, Hypothyroidism, and Ischemic cardiomyopathy.   Surgical History:   Past Surgical History:  Procedure Laterality Date   APPENDECTOMY     CORONARY ARTERY BYPASS GRAFT N/A 11/04/2018   Procedure: CORONARY ARTERY BYPASS GRAFTING (CABG), ON PUMP, TIMES THREE, USING LEFT AND RIGHT INTERNAL MAMMARY ARTERIES;  Surgeon: Wonda Olds, MD;  Location: [facility];  Service: Open Heart Surgery;  Laterality: N/A;   CORONARY STENT INTERVENTION N/A 03/20/2017   Procedure: DES x 3 LAD, DES OM; Surgeon: Burnell Blanks, MD;  Location: [facility];  Service: Cardiovascular;  Laterality: N/A;   CORONARY/GRAFT ACUTE MI REVASCULARIZATION N/A 02/08/2018   Procedure: Coronary/Graft Acute MI Revascularization;  Surgeon: Belva Crome, MD;  Location: [facility];  Service: Cardiovascular;  Laterality: N/A;   LEFT HEART CATH AND CORONARY ANGIOGRAPHY N/A 03/20/2017   Procedure: LEFT HEART CATH AND CORONARY ANGIOGRAPHY;   Surgeon: Burnell Blanks, MD;  Location: [facility];  Service: Cardiovascular;  Laterality: N/A;   LEFT HEART CATH AND CORONARY ANGIOGRAPHY N/A 02/08/2018   Procedure: LEFT HEART CATH AND CORONARY ANGIOGRAPHY;  Surgeon: Belva Crome, MD;  Location: [facility];  Service: Cardiovascular;  Laterality: N/A;   LEFT HEART CATH AND CORONARY ANGIOGRAPHY N/A 10/29/2018   Procedure: LEFT HEART CATH AND CORONARY ANGIOGRAPHY;  Surgeon: Burnell Blanks, MD;  Location: [facility];  Service: Cardiovascular;  Laterality: N/A;   TEE WITHOUT CARDIOVERSION N/A 11/04/2018   Procedure: TRANSESOPHAGEAL ECHOCARDIOGRAM (TEE);  Surgeon: Wonda Olds, MD;  Location: [facility];  Service: Open Heart Surgery;  Laterality: N/A;     Social History:   reports that she has never smoked. She has never used smokeless tobacco. She reports that she does not currently use drugs after having used the following drugs: Marijuana. She reports that she does not drink alcohol.   Family History:  Her family history includes Asthma in her father; Cancer in her mother; Congestive Heart Failure in her father; Coronary artery disease in her father.   Allergies Allergies  Allergen Reactions   Atorvastatin Other (See Comments)    Myalgias on '80mg'$  dose, tolerates '20mg'$  ok   Crestor [Rosuvastatin Calcium] Other (See Comments)    Severe myalgias and joint pain. Has tolerated atorvastatin though   Monosodium Glutamate Nausea And Vomiting and Other (See Comments)    migraine   Peanut-Containing Drug Products Other (See Comments)    Vomiting, upset stomach and some wheezing   Zetia [Ezetimibe] Other (See Comments)    myalgias   Ciprofloxin Hcl [Ciprofloxacin] Nausea And Vomiting and Other (See Comments)    Severe migraine   Food Color Red [Red Dye] Diarrhea     Home Medications  Prior to Admission medications   Medication Sig Start Date End Date Taking? Authorizing Provider  acetaminophen  (TYLENOL) 325 MG tablet Take 650 mg by mouth daily as needed (couch/pain/fever).   Yes [provider]  albuterol (VENTOLIN HFA) 108 (90 Base) MCG/ACT inhaler INHALE  2 PUFFS INTO THE LUNGS EVERY 6 (SIX) HOURS AS NEEDED FOR WHEEZING OR SHORTNESS OF BREATH. 08/02/20 08/02/21 Yes Newlin, Enobong, MD  Alpha-Lipoic Acid 100 MG TABS Take 100-200 tablets by mouth daily.   Yes [provider]  aspirin 81 MG EC tablet Take 1 tablet (81 mg total) by mouth daily. Patient taking differently: Take 81 mg by mouth daily as needed for pain or fever. 12/07/17  Yes Amin, Jeanella Flattery, MD  b complex vitamins tablet Take 1 tablet by mouth daily.   Yes [provider]  beclomethasone (QVAR) 40 MCG/ACT inhaler INHALE 2 PUFFS INTO THE LUNGS 2 (TWO) TIMES DAILY. 03/15/20 03/15/21 Yes [name], NP  clopidogrel (PLAVIX) 75 MG tablet TAKE 1 TABLET BY MOUTH DAILY WITH BREAKFAST. 08/25/19  Yes Turner, Eber Hong, MD  diclofenac Sodium (VOLTAREN) 1 % GEL Apply 2 g topically 4 (four) times daily. Patient taking differently: Apply 2 g topically daily as needed (For pain). 03/19/19  Yes [name], NP  ferrous sulfate 325 (65 FE) MG tablet Take 325 mg by mouth See admin instructions. Take one tablet (325 mg) by mouth once or twice during menstrual cycle   Yes [provider]  FLUoxetine (PROZAC) 40 MG capsule TAKE 1 CAPSULE BY MOUTH DAILY. 05/21/20 05/21/21 Yes [name], NP  fluticasone (FLONASE) 50 MCG/ACT nasal spray Place 1 spray into both nostrils at bedtime as needed for allergies.   Yes [provider]  hydrOXYzine (ATARAX/VISTARIL) 25 MG tablet Take 1 tablet (25 mg total) by mouth 3 (three) times daily as needed. Patient taking differently: Take 25 mg by mouth 3 (three) times daily as needed for anxiety. 03/19/19  Yes [name], NP  insulin lispro (HUMALOG) 100 UNIT/ML injection Inject 35 units into the skin daily via pump 05/22/20 05/22/21 Yes Renato Shin, MD   ipratropium (ATROVENT) 0.06 % nasal spray Place 2 sprays into both nostrils 2 (two) times daily as needed for rhinitis.   Yes [provider]  levothyroxine (SYNTHROID) 100 MCG tablet TAKE 1 TABLET (100 MCG TOTAL) BY MOUTH DAILY BEFORE BREAKFAST. Patient taking differently: Take 100 mcg by mouth daily before breakfast. 11/17/19 11/16/20 Yes Renato Shin, MD  LORazepam (ATIVAN) 0.5 MG tablet Take 1 tablet (0.5 mg total) by mouth every 8 (eight) hours as needed for anxiety. 07/13/19  Yes Ripley Fraise, MD  metoCLOPramide (REGLAN) 5 MG tablet Take 1 tablet (5 mg total) by mouth every 12 (twelve) hours as needed for nausea or vomiting. 12/17/18 10/01/20 Yes McClung, Dionne Bucy, PA-C  montelukast (SINGULAIR) 10 MG tablet TAKE 1 TABLET (10 MG TOTAL) BY MOUTH AT BEDTIME. Patient taking differently: Take 10 mg by mouth at bedtime. 09/03/20 09/03/21 Yes [name], NP  nitroGLYCERIN (NITROSTAT) 0.4 MG SL tablet Place 1 tablet (0.4 mg total) under the tongue every 5 (five) minutes as needed for chest pain. 01/08/19  Yes Turner, Traci R, MD  ondansetron (ZOFRAN-ODT) 4 MG disintegrating tablet TAKE 1 TABLET (4 MG TOTAL) BY MOUTH EVERY 8 (EIGHT) HOURS AS NEEDED FOR NAUSEA OR VOMITING. Patient taking differently: Take 4 mg by mouth every 8 (eight) hours as needed for refractory nausea / vomiting. 02/27/20 02/26/21 Yes [name], NP  pantoprazole (PROTONIX) 40 MG tablet Take 1 tablet (40 mg total) by mouth 2 (two) times daily as needed (acid reflux). Patient taking differently: Take 40 mg by mouth as needed. Pt taking as needed for indigestion/acid reflux. 06/03/19 10/01/20 Yes [name], NP  benzonatate (TESSALON) 200 MG capsule Take 1 capsule (200 mg total) by mouth 2 (two) times daily as needed for cough. Patient not taking: Reported on 10/01/2020 06/03/19   [name], NP  Insulin Human (INSULIN PUMP) SOLN Inject into the skin continuous. Humalog    [provider]     Critical  care time:      Total critical care time: 59 minutes  Performed by: [place] care time was exclusive of separately billable procedures and treating other patients.   Critical care was necessary to treat or prevent imminent or life-threatening deterioration.   Critical care was time spent personally by me on the following activities: development of treatment plan with patient and/or surrogate as well as nursing, discussions with consultants, evaluation of patient's response to treatment, examination of patient, obtaining history from patient or surrogate, ordering and performing treatments and interventions, ordering and review of laboratory studies, ordering and review of radiographic studies, pulse oximetry and re-evaluation of patient's condition.   Jacky Kindle MD [facility] See Amion for pager If no response to pager, please call [phone] until 7pm After 7pm, Please call E-link [phone]

## 2020-10-02 NOTE — Significant Event (Signed)
Rapid Response Event Note   Reason for Call :  New onset of severe abdominal pain  Initial Focused Assessment:  Called by the RN because the patient was reporting that she had an acute onset of abdominal pain. Patient had a CT scan on 9/8 that showed no abnormal abdominal findings. She rates that the pain is 10/10, stabbing in nature, and unrelenting. The patient is thrashing around in bed with discomfort. She stated "she felt like she wasn't getting any oxygen" and ripped her nasal cannula off.   BP 164/104 HR 139 RR 23 O2 89  Patient was eventually placed on bipap for her respiratory distress. She was also given 2 mg of morphine.     Interventions:  NRB placed on patient 12-lead EKG showed sinus tachycardia and nonspecific ST changes Vitals taken Bipap placed Lasix 60 mg given IV  Plan of Care:  Patient placed on NRB Patient transferred to 2H-08 due to flash pulmonary edema and respiratory distress   Event Summary:   MD Notified:  Call Time: Arrival Time: End Time:  Venetia Maxon, RN

## 2020-10-02 NOTE — Progress Notes (Signed)
  Echocardiogram 2D Echocardiogram has been performed.  Jennifer Hogan 10/02/2020, 1:44 PM

## 2020-10-02 NOTE — Progress Notes (Addendum)
pt is c/o of nausea , RN went to get prn medication and upon return pt is complaining of abd pain rated 10 out of 10, pt is restless unable to sit still jerking in the bed  Abdomen soft tender to touch with no discoloration  Rapid notified  MD notified  EKG preformed  Current HR 129 Spo2 92 with non rebreather  BP 174/86 map 109  MD and rapid at bedside

## 2020-10-02 NOTE — Progress Notes (Signed)
Note transfer to ICU. Per RN the plan is to continue IV insulin and not restart insulin pump at this time.  Will follow.   Thanks,  Adah Perl, RN, BC-ADM Inpatient Diabetes Coordinator Pager [phone]  (8a-5p)

## 2020-10-02 NOTE — Progress Notes (Addendum)
Progress Note  Patient Name: Jennifer Hogan Date of Encounter: 10/02/2020  [facility] Cardiologist: Fransico Him, MD   Subjective   Mild dyspnea; chest "heavy" with lying flat  Inpatient Medications    Scheduled Meds:  aspirin EC  81 mg Oral Daily   budesonide  0.25 mg Nebulization BID   clopidogrel  75 mg Oral Daily   FLUoxetine  40 mg Oral Daily   levothyroxine  100 mcg Oral Q0600   metoprolol tartrate  12.5 mg Oral BID   pantoprazole  40 mg Oral Daily   Continuous Infusions:  azithromycin 250 mL/hr at 10/02/20 0350   cefTRIAXone (ROCEPHIN)  IV 2 g (10/02/20 0901)   dextrose 5% lactated ringers 1,000 mL (10/02/20 0805)   heparin 750 Units/hr (10/02/20 0546)   insulin 1.7 Units/hr (10/02/20 0917)   lactated ringers     lactated ringers     sodium chloride     PRN Meds: albuterol, dextrose, dextrose, metoCLOPramide (REGLAN) injection, ondansetron (ZOFRAN) IV   Vital Signs    Vitals:   10/02/20 0500 10/02/20 0600 10/02/20 0735 10/02/20 0851  BP: 116/74 93/60  98/62  Pulse: 84 89  92  Resp: 18 18    Temp:      TempSrc:      SpO2: 94%  92%   Weight:      Height:        Intake/Output Summary (Last 24 hours) at 10/02/2020 0954 Last data filed at 10/02/2020 0548 Gross per 24 hour  Intake 5085.07 ml  Output 900 ml  Net 4185.07 ml   Last 3 Weights 10/01/2020 09/30/2020 11/17/2019  Weight (lbs) 137 lb 2 oz 145 lb 156 lb 12.8 oz  Weight (kg) 62.2 kg 65.772 kg 71.124 kg      Telemetry    NSR with NSVT - Personally Reviewed  Physical Exam   GEN: No acute distress.   Neck: supple Cardiac: RRR Respiratory: Minimal basilar crackles GI: Soft, nontender, non-distended  MS: No edema Neuro:  Nonfocal  Psych: Normal affect   Labs    High Sensitivity Troponin:   Recent Labs  Lab 09/30/20 1658 09/30/20 1853 10/01/20 1553  TROPONINIHS 61* 54* >24,000*      Chemistry Recent Labs  Lab 10/01/20 1911 10/01/20 2203 10/02/20 0443  NA 136 137 137  K  4.0 3.8 3.6  CL 106 109 111  CO2 15* 17* 19*  GLUCOSE 189* 177* 130*  BUN 26* 25* 20  CREATININE 1.12* 1.10* 0.92  CALCIUM 8.7* 8.3* 8.1*  GFRNONAA 60* >60 >60  ANIONGAP '15 11 7     '$ Hematology Recent Labs  Lab 09/30/20 1658 09/30/20 1947 09/30/20 1948 10/01/20 0057  WBC 20.1*  --   --  24.9*  RBC 3.94  --   --  3.52*  HGB 12.4 13.3 13.3 11.3*  HCT 37.6 39.0 39.0 33.6*  MCV 95.4  --   --  95.5  MCH 31.5  --   --  32.1  MCHC 33.0  --   --  33.6  RDW 13.4  --   --  13.5  PLT 455*  --   --  406*     Radiology    DG Chest 2 View  Result Date: 09/30/2020 CLINICAL DATA:  Chest pain and shortness of breath. EXAM: CHEST - 2 VIEW COMPARISON:  03/02/2020, CT 06/06/2019 FINDINGS: Patient is post median sternotomy.The cardiomediastinal contours are normal. Coronary stents are seen. Mild diffuse bronchial wall thickening with borderline hyperinflation.  Pulmonary vasculature is normal. No consolidation, pleural effusion, or pneumothorax. No acute osseous abnormalities are seen. IMPRESSION: Mild diffuse bronchial wall thickening with borderline hyperinflation, can be seen with bronchitis or asthma. Electronically Signed   By: Keith Rake M.D.   On: 09/30/2020 17:42   CT Angio Chest PE W and/or Wo Contrast  Result Date: 09/30/2020 CLINICAL DATA:  PE suspected, high prob Chest pain and shortness of breath. EXAM: CT ANGIOGRAPHY CHEST WITH CONTRAST TECHNIQUE: Multidetector CT imaging of the chest was performed using the standard protocol during bolus administration of intravenous contrast. Multiplanar CT image reconstructions and MIPs were obtained to evaluate the vascular anatomy. CONTRAST:  147m OMNIPAQUE IOHEXOL 350 MG/ML SOLN COMPARISON:  Radiograph earlier today.  Chest CTA 06/06/2019 FINDINGS: Cardiovascular: There are no filling defects within the pulmonary arteries to suggest pulmonary embolus. Normal caliber thoracic aorta. Post CABG with calcification of native coronary arteries. The  heart is normal in size. No pericardial effusion. Mediastinum/Nodes: No enlarged mediastinal lymph nodes. Few scattered bilateral hilar nodes are not enlarged by size criteria. Small hiatal hernia. Lungs/Pleura: Patchy areas of ground-glass opacity throughout both lungs in a peripheral and upper lobe predominant distribution. Moderate central bronchial thickening. No pleural effusion. No pulmonary nodule or mass. Upper Abdomen: Assessed on concurrent abdominal CT, reported separately. Musculoskeletal: Post median sternotomy. Thoracic spondylosis with midthoracic There are no acute or suspicious osseous abnormalities. degenerative disc disease. Review of the MIP images confirms the above findings. IMPRESSION: 1. No pulmonary embolus. 2. Patchy areas of ground-glass opacity throughout both lungs in a peripheral and upper lobe predominant distribution. Findings may represent pneumonia, including COVID-19. Possibility of eosinophilic pneumonia or interstitial pneumonias are also considered. 3. Moderate central bronchial thickening, can be seen with bronchitis or reactive airways disease. 4. Small hiatal hernia. Electronically Signed   By: MKeith RakeM.D.   On: 09/30/2020 21:58   CT ABDOMEN PELVIS W CONTRAST  Result Date: 09/30/2020 CLINICAL DATA:  Abdominal distension. EXAM: CT ABDOMEN AND PELVIS WITH CONTRAST TECHNIQUE: Multidetector CT imaging of the abdomen and pelvis was performed using the standard protocol following bolus administration of intravenous contrast. CONTRAST:  1062mOMNIPAQUE IOHEXOL 350 MG/ML SOLN COMPARISON:  Assessed on chest CTA performed concurrently. FINDINGS: Lower chest: Abdominopelvic CT 07/14/2019 Hepatobiliary: Mild hepatic steatosis. No evidence of focal liver lesion. Gallbladder physiologically distended, no calcified stone. No biliary dilatation. Pancreas: No ductal dilatation or inflammation. Spleen: Normal in size without focal abnormality. Adrenals/Urinary Tract: Normal  adrenal glands. No hydronephrosis or perinephric edema. Homogeneous renal enhancement. No evidence of focal lesion or stone. Urinary bladder is distended without wall thickening. Stomach/Bowel: Small hiatal hernia. Stomach is partially distended, unremarkable. There is no small bowel obstruction or inflammation. Appendectomy. The colon is near completely decompressed and not well assessed, no obvious pericolonic edema. Vascular/Lymphatic: Normal caliber abdominal aorta. Minimal aortic atherosclerosis. Patent portal vein. No enlarged lymph nodes in the abdomen or pelvis. Reproductive: Uterus and bilateral adnexa are unremarkable. Other: No free air, free fluid, or intra-abdominal fluid collection. Musculoskeletal: Degenerative disc disease at L3-L4 and L4-L5 with Modic endplate changes and vacuum phenomena. Stable appearance from prior exam. There are no acute or suspicious osseous abnormalities. IMPRESSION: 1. No acute abnormality in the abdomen/pelvis. 2. Mild hepatic steatosis. 3. Small hiatal hernia. Aortic Atherosclerosis (ICD10-I70.0). Electronically Signed   By: MeKeith Rake.D.   On: 09/30/2020 22:03     Patient Profile     [age]ear old female with past medical history of coronary artery disease status post coronary  artery bypass and graft (LIMA to the LAD/ramus intermedius and RIMA to the obtuse marginal), diabetes mellitus, hyperlipidemia admitted with DKA being evaluated for non-ST elevation myocardial infarction.  Assessment & Plan    Non-ST elevation myocardial infarction-continue medical therapy with aspirin, Plavix, IV heparin, discontinue metoprolol as her blood pressure has been low.  She is intolerant to statins. Plan will be to proceed with cardiac catheterization on Monday.  The risk and benefits were previously discussed including myocardial infarction, CVA and death and she agreed to proceed.  She has mild dyspnea this morning and may be becoming volume overloaded.  Await  echocardiogram to assess LV function.  Decrease IV fluids.  Will give lasix 20 mg IV x 1.  We will check chest x-ray. Acute kidney injury-renal function has now normalized. DKA-managed by primary care. Hyperlipidemia-patient apparently has an intolerance to statins and Zetia.  Following discharge will need to follow-up in the lipid clinic for consideration of Repatha or Praluent. Question pneumonia-antibiotics per primary care.  For questions or updates, please contact [place] Please consult [email or web address] for contact info under        Signed, Kirk Ruths, MD  10/02/2020, 9:54 AM

## 2020-10-02 NOTE — Progress Notes (Addendum)
Called to see patient for acute dyspnea.  Upon arrival patient in severe respiratory distress.  Saturations in the 30s.  Patient agitated.  She complained of mild chest tightness and severe dyspnea.  She was placed on nonrebreather.  Exam difficult due to respiratory distress but she has diffuse crackles.  I cannot appreciate murmur though there is a gallop.  Electrocardiogram shows sinus tachycardia, left anterior fascicular block, nonspecific ST changes and septal infarct.  Patient subsequently given Lasix 60 mg IV x1.  She was also given morphine 2 mg IV.  She was placed on BiPAP and her saturations improved to 97% with some decrease in respiratory distress.  Critical care medicine also evaluated the patient.  She will be transferred to the ICU.  We will plan echocardiogram to assess LV function and rule out mechanical complication though I do not appreciate a murmur on examination.  Continue diuresis with Lasix 40 mg IV twice daily.  Patient remains extremely tenuous.  Plan is still to proceed with catheterization on Monday.  I do not think there is an urgent need at this point though this can be reconsidered if necessary.  I think likely patient presented with DKA/myocardial infarction.  She was hydrated appropriately for her DKA and likely has an ischemic cardiomyopathy from recent infarct; CHF then occurred with hydration. CRITICAL CARE Performed by: Kirk Ruths   Total critical care time: 30 minutes  Critical care time was exclusive of separately billable procedures and treating other patients.  Critical care was necessary to treat or prevent imminent or life-threatening deterioration.  Critical care was time spent personally by me on the following activities: development of treatment plan with patient and/or surrogate as well as nursing, discussions with consultants, evaluation of patient's response to treatment, examination of patient, obtaining history from patient or surrogate, ordering and  performing treatments and interventions, ordering and review of laboratory studies, ordering and review of radiographic studies, pulse oximetry and re-evaluation of patient's condition.  Greater than 30 minutes critical care time 11:45 to 12:15 AM. Kirk Ruths, MD

## 2020-10-03 DIAGNOSIS — J810 Acute pulmonary edema: Secondary | ICD-10-CM

## 2020-10-03 LAB — BASIC METABOLIC PANEL WITH GFR
Anion gap: 15 (ref 5–15)
BUN: 13 mg/dL (ref 6–20)
CO2: 22 mmol/L (ref 22–32)
Calcium: 8.2 mg/dL — ABNORMAL LOW (ref 8.9–10.3)
Chloride: 98 mmol/L (ref 98–111)
Creatinine, Ser: 0.84 mg/dL (ref 0.44–1.00)
GFR, Estimated: >60 mL/min (ref >60)
Glucose, Bld: 133 mg/dL — ABNORMAL HIGH (ref 70–99)
Potassium: 3.5 mmol/L (ref 3.5–5.1)
Sodium: 135 mmol/L (ref 135–145)

## 2020-10-03 LAB — GLUCOSE, CAPILLARY
Glucose-Capillary: 123 mg/dL — ABNORMAL HIGH (ref 70–99)
Glucose-Capillary: 130 mg/dL — ABNORMAL HIGH (ref 70–99)
Glucose-Capillary: 131 mg/dL — ABNORMAL HIGH (ref 70–99)
Glucose-Capillary: 149 mg/dL — ABNORMAL HIGH (ref 70–99)
Glucose-Capillary: 166 mg/dL — ABNORMAL HIGH (ref 70–99)
Glucose-Capillary: 167 mg/dL — ABNORMAL HIGH (ref 70–99)
Glucose-Capillary: 204 mg/dL — ABNORMAL HIGH (ref 70–99)

## 2020-10-03 LAB — BLOOD CULTURE ID PANEL (REFLEXED) - BCID2

## 2020-10-03 LAB — CBC
HCT: 39.9 % (ref 36.0–46.0)
Hemoglobin: 14 g/dL (ref 12.0–15.0)
MCH: 32.2 pg (ref 26.0–34.0)
MCHC: 35.1 g/dL (ref 30.0–36.0)
MCV: 91.7 fL (ref 80.0–100.0)
Platelets: 480 10*3/uL — ABNORMAL HIGH (ref 150–400)
RBC: 4.35 MIL/uL (ref 3.87–5.11)
RDW: 13.5 % (ref 11.5–15.5)
WBC: 12.2 10*3/uL — ABNORMAL HIGH (ref 4.0–10.5)
nRBC: 0 % (ref 0.0–0.2)

## 2020-10-03 LAB — BASIC METABOLIC PANEL
Anion gap: 11 (ref 5–15)
Anion gap: 12 (ref 5–15)
BUN: 13 mg/dL (ref 6–20)
BUN: 11 mg/dL (ref 6–20)
CO2: 26 mmol/L (ref 22–32)
CO2: 25 mmol/L (ref 22–32)
Calcium: 8.9 mg/dL (ref 8.9–10.3)
Calcium: 8.6 mg/dL — ABNORMAL LOW (ref 8.9–10.3)
Chloride: 100 mmol/L (ref 98–111)
Chloride: 98 mmol/L (ref 98–111)
Creatinine, Ser: 0.93 mg/dL (ref 0.44–1.00)
Creatinine, Ser: 0.8 mg/dL (ref 0.44–1.00)
GFR, Estimated: >60 mL/min (ref >60)
GFR, Estimated: >60 mL/min (ref >60)
Glucose, Bld: 134 mg/dL — ABNORMAL HIGH (ref 70–99)
Glucose, Bld: 143 mg/dL — ABNORMAL HIGH (ref 70–99)
Potassium: 2.9 mmol/L — ABNORMAL LOW (ref 3.5–5.1)
Potassium: 3.4 mmol/L — ABNORMAL LOW (ref 3.5–5.1)
Sodium: 137 mmol/L (ref 135–145)
Sodium: 135 mmol/L (ref 135–145)

## 2020-10-03 LAB — LEGIONELLA PNEUMOPHILA SEROGP 1 UR AG: L. pneumophila Serogp 1 Ur Ag: NEGATIVE

## 2020-10-03 LAB — HEPARIN LEVEL (UNFRACTIONATED)
Heparin Unfractionated: 0.19 IU/mL — ABNORMAL LOW (ref 0.30–0.70)
Heparin Unfractionated: 0.41 [IU]/mL (ref 0.30–0.70)
Heparin Unfractionated: 0.38 IU/mL (ref 0.30–0.70)

## 2020-10-03 LAB — SEDIMENTATION RATE: Sed Rate: 55 mm/hr — ABNORMAL HIGH (ref 0–22)

## 2020-10-03 LAB — C-REACTIVE PROTEIN: CRP: 6.2 mg/dL — ABNORMAL HIGH (ref <1.0)

## 2020-10-03 LAB — BETA-HYDROXYBUTYRIC ACID: Beta-Hydroxybutyric Acid: 0.74 mmol/L — ABNORMAL HIGH (ref 0.05–0.27)

## 2020-10-03 MED ORDER — POTASSIUM CHLORIDE CRYS ER 20 MEQ PO TBCR
40.0000 meq | EXTENDED_RELEASE_TABLET | Freq: Once | ORAL | Status: AC
  Administered 2020-10-03: 40 meq via ORAL
  Filled 2020-10-03: qty 2

## 2020-10-03 MED ORDER — NITROGLYCERIN 0.4 MG SL SUBL
0.4000 mg | SUBLINGUAL_TABLET | SUBLINGUAL | Status: DC | PRN
  Administered 2020-10-03 (×2): 0.4 mg via SUBLINGUAL
  Filled 2020-10-03 (×2): qty 1

## 2020-10-03 MED ORDER — ATORVASTATIN CALCIUM 10 MG PO TABS
20.0000 mg | ORAL_TABLET | Freq: Every day | ORAL | Status: DC
  Administered 2020-10-03 – 2020-10-06 (×4): 20 mg via ORAL
  Filled 2020-10-03 (×4): qty 2

## 2020-10-03 MED ORDER — POTASSIUM CHLORIDE CRYS ER 20 MEQ PO TBCR
20.0000 meq | EXTENDED_RELEASE_TABLET | Freq: Once | ORAL | Status: AC
  Administered 2020-10-03: 20 meq via ORAL
  Filled 2020-10-03: qty 1

## 2020-10-03 MED ORDER — SPIRONOLACTONE 12.5 MG HALF TABLET
12.5000 mg | ORAL_TABLET | Freq: Every day | ORAL | Status: DC
  Administered 2020-10-03 – 2020-10-06 (×4): 12.5 mg via ORAL
  Filled 2020-10-03 (×4): qty 1

## 2020-10-03 MED ORDER — SODIUM CHLORIDE 0.9 % IV SOLN
2.0000 g | INTRAVENOUS | Status: DC
  Administered 2020-10-03 – 2020-10-05 (×3): 2 g via INTRAVENOUS
  Filled 2020-10-03 (×3): qty 20

## 2020-10-03 MED ORDER — HEPARIN BOLUS VIA INFUSION
1000.0000 [IU] | Freq: Once | INTRAVENOUS | Status: AC
  Administered 2020-10-03: 1000 [IU] via INTRAVENOUS
  Filled 2020-10-03: qty 1000

## 2020-10-03 MED ORDER — POTASSIUM CHLORIDE 10 MEQ/100ML IV SOLN
10.0000 meq | INTRAVENOUS | Status: AC
  Administered 2020-10-03 (×4): 10 meq via INTRAVENOUS
  Filled 2020-10-03 (×4): qty 100

## 2020-10-03 MED ORDER — FUROSEMIDE 10 MG/ML IJ SOLN
20.0000 mg | Freq: Once | INTRAMUSCULAR | Status: AC
  Administered 2020-10-03: 20 mg via INTRAVENOUS
  Filled 2020-10-03: qty 2

## 2020-10-03 MED ORDER — ACETAMINOPHEN 325 MG PO TABS
650.0000 mg | ORAL_TABLET | Freq: Four times a day (QID) | ORAL | Status: DC | PRN
  Administered 2020-10-03 – 2020-10-06 (×5): 650 mg via ORAL
  Filled 2020-10-03 (×5): qty 2

## 2020-10-03 MED ORDER — METOPROLOL TARTRATE 12.5 MG HALF TABLET
12.5000 mg | ORAL_TABLET | Freq: Two times a day (BID) | ORAL | Status: DC
  Administered 2020-10-03 – 2020-10-04 (×3): 12.5 mg via ORAL
  Filled 2020-10-03 (×3): qty 1

## 2020-10-03 MED ORDER — SODIUM CHLORIDE 0.9% FLUSH: 3.0000 mL | Freq: Two times a day (BID) | INTRAVENOUS | Status: DC

## 2020-10-03 MED ORDER — INSULIN DETEMIR 100 UNIT/ML ~~LOC~~ SOLN
7.0000 [IU] | Freq: Two times a day (BID) | SUBCUTANEOUS | Status: DC
  Administered 2020-10-03 – 2020-10-04 (×3): 7 [IU] via SUBCUTANEOUS
  Filled 2020-10-03 (×5): qty 0.07

## 2020-10-03 MED ORDER — METOPROLOL TARTRATE 12.5 MG HALF TABLET: 12.5000 mg | ORAL_TABLET | Freq: Two times a day (BID) | ORAL | Status: DC

## 2020-10-03 NOTE — Progress Notes (Signed)
[place] for IV Heparin Indication: chest pain/ACS  Allergies  Allergen Reactions   Atorvastatin Other (See Comments)    Myalgias on '80mg'$  dose, tolerates '20mg'$  ok   Crestor [Rosuvastatin Calcium] Other (See Comments)    Severe myalgias and joint pain. Has tolerated atorvastatin though   Monosodium Glutamate Nausea And Vomiting and Other (See Comments)    migraine   Peanut-Containing Drug Products Other (See Comments)    Vomiting, upset stomach and some wheezing   Zetia [Ezetimibe] Other (See Comments)    myalgias   Ciprofloxin Hcl [Ciprofloxacin] Nausea And Vomiting and Other (See Comments)    Severe migraine   Food Color Red [Red Dye] Diarrhea    Patient Measurements: Height: '5\' 5"'$  (165.1 cm) Weight: 62.5 kg (137 lb 12.6 oz) IBW/kg (Calculated) : 57 Heparin Dosing Weight: 62.2 kg  Vital Signs: Temp: 97.6 F (36.4 C) (09/11 1927) Temp Source: Axillary (09/11 1927) BP: 88/60 (09/11 2100) Pulse Rate: 89 (09/11 2115)  Labs: Recent Labs    10/01/20 0057 10/01/20 1553 10/01/20 1911 10/03/20 0652 10/03/20 1214 10/03/20 1448 10/03/20 2211  HGB 11.3*  --   --  14.0  --   --   --   HCT 33.6*  --   --  39.9  --   --   --   PLT 406*  --   --  480*  --   --   --   HEPARINUNFRC  --   --    < > 0.19*  --  0.41 0.38  CREATININE  --  1.26*   < > 0.93 0.84 0.80  --   TROPONINIHS  --  >24,000*  --   --   --   --   --    < > = values in this interval not displayed.     Estimated Creatinine Clearance: 74.9 mL/min (by C-G formula based on SCr of 0.8 mg/dL).   Medical History: Past Medical History:  Diagnosis Date   Anemia    Anxiety    Coronary artery disease    a. s/p PTCA DES x3 in the LAD (ostial DES placed, mid DES placed, distal DES placed) and PTCA/DES x1 to intermediate branch 02/2017. b. ACS 02/08/18 with LCx stent placement.   Depression    Diabetes mellitus    type 1, on insulin pump   DKA (diabetic ketoacidoses) 12/31/2017    Elevated platelet count    High cholesterol    Hypothyroidism    Ischemic cardiomyopathy    a. EF low-normal with basal inferior akinesis, basal septal hypokinesis by echo 01/2018.    Medications:  Scheduled:   Infusions:   cefTRIAXone (ROCEPHIN)  IV Stopped (10/03/20 0912)   heparin 1,250 Units/hr (10/03/20 1923)   sodium chloride      Assessment: 50 years of age female with high sensitivity troponin of >24,000 on IV Heparin for ACS; history hx CAD status post stenting (2019) and CABG (2020).  Not on anticoagulation prior to admission. Patient is noted to be in DKA on insulin drip.   Heparin level 0.38 tonight on 1250 units/hr. No bleeding or IV issues noted.   Goal of Therapy:  Heparin level 0.3-0.7 units/ml Monitor platelets by anticoagulation protocol: Yes   Plan:  Continue IV heparin at current rate. Heparin level daily with CBC daily  Erin Hearing PharmD., BCPS Clinical Pharmacist 10/03/2020 10:53 PM

## 2020-10-03 NOTE — Progress Notes (Addendum)
Assessed patient for evaluation of possible pericarditis given elevated inflammatory markers and pleuritic chest pain.   58F with CAD s/p CABG (LIMA to the LAD/ramus intermedius and RIMA to the obtuse marginal), DM1 with insulin pump, and HLD admitted with DKA being evaluated for NSTEMI.  Patient developed severe respiratory distress on 10/02/20 2/2 pulmonary edema and was transferred to [facility]. TTE with newly reduced LV fxn (EF 20-25%) and grade 1 diastolic dysfxn along with small pericardial effusion.  Plan is for coronary angiography 09/12.  Review of notes with patient still hypoxic 09/11 AM with O2 sats in the 70s requiring nonrebreather and RRT.  She reported SOB but denied any chest pain or palpitations.  She was being treated with antibiotics however there was low suspicion for infection so these were discontinued today.   Labs reviewed Significant leukocytosis (WBC peaked 24.9 on 09/09) hsT 61->54->>24k BG 550-> (>700)->143 most recent  sCr with initial AKI (1.9) now back to bl (0.8)   ECG Result date: 10/03/20 Sinus tach, HR 125 No ischemic changes   ECG Result date: 09/30/20  Sinus tach, no ishcemic changes   TTE Result date: 10/02/20  1. Global hypokinesis with akinesis of all apical segments. Left  ventricular ejection fraction, by estimation, is 20 to 25%. The left  ventricle has severely decreased function. The left ventricle demonstrates  global hypokinesis. Left ventricular  diastolic parameters are consistent with Grade I diastolic dysfunction  (impaired relaxation). Elevated left ventricular end-diastolic pressure.   2. Right ventricular systolic function is normal. The right ventricular  size is normal. There is normal pulmonary artery systolic pressure.   3. A small pericardial effusion is present.   4. The mitral valve is normal in structure. Trivial mitral valve  regurgitation. No evidence of mitral stenosis.   5. The aortic valve is tricuspid. Aortic valve  regurgitation is not  visualized. No aortic stenosis is present.   6. The inferior vena cava is normal in size with greater than 50%  respiratory variability, suggesting right atrial pressure of 3 mmHg.   TTE  Result date: 11/25/18  1. Left ventricular ejection fraction, by visual estimation, is 55 to  60%. The left ventricle has normal function. There is no left ventricular  hypertrophy.   2. Abnormal septal motion consistent with post-operative status.   3. Left ventricular diastolic parameters are indeterminate.   4. Very mild inferior wall motion hypokinesis, improved from prior.   5. Global right ventricle has normal systolic function.The right  ventricular size is normal. No increase in right ventricular wall  thickness.   6. Left atrial size was normal.   7. Right atrial size was normal.   8. Moderate pleural effusion in the left lateral region.   9. The mitral valve is normal in structure. Trace mitral valve  regurgitation.  10. The tricuspid valve is normal in structure. Tricuspid valve  regurgitation is trivial.  11. The aortic valve is tricuspid. Aortic valve regurgitation is not  visualized. Mild aortic valve sclerosis without stenosis.  12. The pulmonic valve was grossly normal. Pulmonic valve regurgitation is  trivial.  13. Normal pulmonary artery systolic pressure.  14. The inferior vena cava is normal in size with greater than 50%  respiratory variability, suggesting right atrial pressure of 3 mmHg.   Ms. Chakrabarti is currently chest pain-free but reported that over the course of the day she had sharp stabbing pain along her left chest that may have been worse when laying back but was fairly constant.  This is different than her prior anginal equivalent or recent chest pressure over the past 2 weeks with associated diaphoresis.  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  Given that she is  currently asymptomatic I don' think we to start colchicine yet however if her symptoms recur I do not think there is much downside to trial of colchicine given that she has tolerated this before.  She would have been excluded from RCT COLCOT given EF <35% and unrevasc CAD on index hospitalization but I think regardless she has a different chest pain component compared to her recent and prior anginal equivalents and has a small pericardial effusion on echo.  I do not think that her elevated inflammatory markers are very helpful in her situation as they are non specific and she just had a significant cardiac stressor given her troponins over assay and had hypoxic respiratory failure this morning.  She does not have a pericardial friction rub on exam. She was able to lay flat without reproducing sx. She is agreeable to above plan.  - asx currently so would not start yet but if sx recur can start colchicine 0.3 mg PO bid given wt >70 kg, Cr 0.8

## 2020-10-03 NOTE — Progress Notes (Signed)
PHARMACY - PHYSICIAN COMMUNICATION CRITICAL VALUE ALERT - BLOOD CULTURE IDENTIFICATION (BCID)  Jennifer Hogan is an 50 y.o. female who presented to [facility] on 09/30/2020 with a chief complaint of chest pain and hyperglycemia.   Assessment:  Patient admitted after found to have malfunctioning insulin pump. Originally treated with azithromycin and ceftriaxone for pneumonia. Now growing strep in 1/2 bottles of blood cultures.   Name of physician (or Provider) Contacted: Mauri Brooklyn MD  Current antibiotics: Antibiotics stopped earlier today, will resume ceftriaxone for strep coverage  Changes to prescribed antibiotics recommended:  Recommendations accepted by provider  Results for orders placed or performed during the hospital encounter of 09/30/20  Blood Culture ID Panel (Reflexed) (Collected: 10/01/2020 12:00 AM)  Result Value Ref Range   Enterococcus faecalis NOT DETECTED NOT DETECTED   Enterococcus Faecium NOT DETECTED NOT DETECTED   Listeria monocytogenes NOT DETECTED NOT DETECTED   Staphylococcus species NOT DETECTED NOT DETECTED   Staphylococcus aureus (BCID) NOT DETECTED NOT DETECTED   Staphylococcus epidermidis PENDING NOT DETECTED   Staphylococcus lugdunensis PENDING NOT DETECTED   Streptococcus species PENDING NOT DETECTED   Streptococcus agalactiae NOT DETECTED NOT DETECTED   Streptococcus pneumoniae NOT DETECTED NOT DETECTED   Streptococcus pyogenes NOT DETECTED NOT DETECTED   A.calcoaceticus-baumannii NOT DETECTED NOT DETECTED   Bacteroides fragilis NOT DETECTED NOT DETECTED   Enterobacterales PENDING NOT DETECTED   Enterobacter cloacae complex NOT DETECTED NOT DETECTED   Escherichia coli NOT DETECTED NOT DETECTED   Klebsiella aerogenes NOT DETECTED NOT DETECTED   Klebsiella oxytoca NOT DETECTED NOT DETECTED   Klebsiella pneumoniae NOT DETECTED NOT DETECTED   Proteus species NOT DETECTED NOT DETECTED   Salmonella species NOT DETECTED NOT DETECTED   Serratia  marcescens NOT DETECTED NOT DETECTED   Haemophilus influenzae NOT DETECTED NOT DETECTED   Neisseria meningitidis NOT DETECTED NOT DETECTED   Pseudomonas aeruginosa NOT DETECTED NOT DETECTED   Stenotrophomonas maltophilia NOT DETECTED NOT DETECTED   Candida albicans NOT DETECTED NOT DETECTED   Candida auris NOT DETECTED NOT DETECTED   Candida glabrata NOT DETECTED NOT DETECTED   Candida krusei NOT DETECTED NOT DETECTED   Candida parapsilosis NOT DETECTED NOT DETECTED   Candida tropicalis NOT DETECTED NOT DETECTED   Cryptococcus neoformans/gattii NOT DETECTED NOT DETECTED   CTX-M ESBL PENDING NOT DETECTED   Carbapenem resistance IMP PENDING NOT DETECTED   Carbapenem resistance KPC PENDING NOT DETECTED   Carbapenem resistance NDM PENDING NOT DETECTED   Carbapenem resist OXA 48 LIKE PENDING NOT DETECTED   Carbapenem resistance VIM PENDING NOT DETECTED    Erin Hearing PharmD., BCPS Clinical Pharmacist 10/03/2020 12:42 AM

## 2020-10-03 NOTE — Progress Notes (Signed)
Inpatient Diabetes Program Recommendations  AACE/ADA: New Consensus Statement on Inpatient Glycemic Control (2015)  Target Ranges:  Prepandial:   less than 140 mg/dL      Peak postprandial:   less than 180 mg/dL (1-2 hours)      Critically ill patients:  140 - 180 mg/dL   Lab Results  Component Value Date   GLUCAP 131 (H) 10/03/2020   HGBA1C 8.5 (H) 10/01/2020    Review of Glycemic Control Results for AMARRIE, HULETTE (MRN [id number]) as of 10/03/2020 11:40  Ref. Range 10/02/2020 19:27 10/02/2020 20:45 10/02/2020 22:17 10/03/2020 00:53 10/03/2020 03:58 10/03/2020 08:35  Glucose-Capillary Latest Ref Range: 70 - 99 mg/dL 152 (H) 150 (H) 161 (H) 149 (H) 204 (H) 131 (H)  Diabetes history: DM 1 Outpatient Diabetes medications: insulin pump with Humalog basal rate of 0.7 units/hr, 6 AM-10 AM, 0.9 units/hr, 10 AM-10 PM, and 0.625 units/hr overnight (when not in "auto mode").  Total of 16.8 units of basal insulin in a 24 hour period bolus of 1 unit/16 grams CHO. correction bolus (which some people call "sensitivity," or "insulin sensitivity ratio," or just "isr") of 1 unit for each 75 by which your glucose exceeds 100.   Sees Renato Shin, Endocrinology Current orders for Inpatient glycemic control:  Levemir 7 units bid, Novolog 2-6 q 4 hours Inpatient Diabetes Program Recommendations:    Agree with current orders.  Will follow.   Thanks,  Adah Perl, RN, BC-ADM Inpatient Diabetes Coordinator Pager [phone]  (8a-5p)

## 2020-10-03 NOTE — Progress Notes (Addendum)
NAME:  Jennifer Hogan, MRN:  [id number], DOB:  1970-10-27, LOS: 3 ADMISSION DATE:  09/30/2020, CONSULTATION DATE: 10/02/2020 REFERRING MD:  Albertine Patricia, MD , CHIEF COMPLAINT: Shortness of breath  History of Present Illness:  50 year old female with type 1 diabetes on insulin pump, coronary artery disease status post stents and CABG who was admitted initially with diabetic ketoacidosis due to malfunctioning insulin pump, she was started on DKA protocol with IV fluid and insulin infusion.  During her stay she was noted to have elevated serum troponin with EKG changes, cardiology was consulted, diagnosed with acute non-ST elevation MI, was started on IV heparin infusion.  This morning patient was noted to be short of breath and hypoxic with O2 sat dropped down to 70s, she was put on nonrebreather mask, rapid response was called and CCM was consulted for evaluation.  During my evaluation patient is on BiPAP, tachypneic O2 sat is at 97% on 60% FiO2.  She did complain of shortness of breath but denies chest pain, palpitation, headache, fever, chills or other complaints  Pertinent  Medical History   Past Medical History:  Diagnosis Date   Anemia    Anxiety    Coronary artery disease    a. s/p PTCA DES x3 in the LAD (ostial DES placed, mid DES placed, distal DES placed) and PTCA/DES x1 to intermediate branch 02/2017. b. ACS 02/08/18 with LCx stent placement.   Depression    Diabetes mellitus    type 1, on insulin pump   DKA (diabetic ketoacidoses) 12/31/2017   Elevated platelet count    High cholesterol    Hypothyroidism    Ischemic cardiomyopathy    a. EF low-normal with basal inferior akinesis, basal septal hypokinesis by echo 01/2018.     Significant Hospital Events: Including procedures, antibiotic start and stop dates in addition to other pertinent events   9/9 admitted with DKA 9/10 consulted and was transferred to ICU for respiratory distress  Interim History / Subjective:   Down to 2L O2. Has some left sided chest pain with moving in bed that resolves with laying very still. Pain is worse with breathing.   Objective   Blood pressure (!) 129/96, pulse (!) 104, temperature 98.3 F (36.8 C), temperature source Oral, resp. rate 14, height '5\' 5"'  (1.651 m), weight 62.5 kg, SpO2 100 %.    FiO2 (%):  [28 %] 28 %   Intake/Output Summary (Last 24 hours) at 10/03/2020 0710 Last data filed at 10/03/2020 0700 Gross per 24 hour  Intake 272.17 ml  Output 3800 ml  Net -3527.83 ml    Filed Weights   09/30/20 1646 10/01/20 1530 10/03/20 0400  Weight: 65.8 kg 62.2 kg 62.5 kg    Examination: General: thin middle aged woman lying in bed in NAD HEENT: [place]/AT, eyes anicteric Neuro: Alert, moving all extremities spontaneously Chest: bilateral rhales, breathing comfortably on [place] Heart: tachycardic, reg rhythm Abdomen: soft, NT Skin: No rash, warm, dry  CXR 9/10 personally reviewed> central opacities suggestive of pulmonary edema, no lobar consolidations  Resolved Hospital Problem list     Assessment & Plan:  Acute hypoxic respiratory failure due to flash pulmonary edema Acute HfrEF due to ACS Coronary artery disease status post CABG, now with acute non-ST elevation MI Hypertension -Agree with lasix for pulmonary edema. -Stopping antibiotics due to low suspicion for acute infectious process. -Con't heparin for ACS. Planning for [facility] tomorrow. -ASA, Plavix. Resuming atorvastatin- only tolerates low dose. -Discussed with cardiology; start low dose  metoprolol to improve HR. Monitor for intolerance from negative inotropy due to initiation of Bblocker. Starting spironolactone. -NTG & morphine PRN for CP. -Optimize electrolytes. Aggressive K+ repletion and recheck this afternoon. -Tele monitoring  Strep bacteremia -con't ceftriaxone -con't to follow blood cultures -repeat blood cultures  Concern for pericarditis -checking ESR and CRP  Diabetic ketoacidosis,  resolved.  -con't basal bolus insulin -goal BG <180  Acute kidney injury -renally dose meds, avoid nephrotoxic meds -strict I/Os  Hypothyroidism -con't levothyroxine  Sepsis/pneumonia was ruled out  Best Practice (right click and "Reselect all SmartList Selections" daily)   Diet/type: clear liquids- NPO past midnight. DVT prophylaxis: systemic heparin> switch to argatroban GI prophylaxis: PPI Lines: N/A Foley:  Yes, and it is still needed Code Status:  full code Last date of multidisciplinary goals of care discussion '[ ]'   Labs   CBC: Recent Labs  Lab 09/30/20 1658 09/30/20 1947 09/30/20 1948 10/01/20 0057  WBC 20.1*  --   --  24.9*  HGB 12.4 13.3 13.3 11.3*  HCT 37.6 39.0 39.0 33.6*  MCV 95.4  --   --  95.5  PLT 455*  --   --  406*     Basic Metabolic Panel: Recent Labs  Lab 09/30/20 1658 09/30/20 1947 09/30/20 1948 10/01/20 1553 10/01/20 1911 10/01/20 2203 10/02/20 0443  NA 130* 126* 126* 133* 136 137 137  K 4.8 6.1* 6.1* 3.8 4.0 3.8 3.6  CL 98 104  --  107 106 109 111  CO2 10*  --   --  15* 15* 17* 19*  GLUCOSE 550* >700*  --  201* 189* 177* 130*  BUN 21* 32*  --  27* 26* 25* 20  CREATININE 1.67* 1.90*  --  1.26* 1.12* 1.10* 0.92  CALCIUM 9.5  --   --  8.7* 8.7* 8.3* 8.1*    GFR: Estimated Creatinine Clearance: 65.1 mL/min (by C-G formula based on SCr of 0.92 mg/dL). Recent Labs  Lab 09/30/20 1658 09/30/20 2000 10/01/20 0054 10/01/20 0057  WBC 20.1*  --   --  24.9*  LATICACIDVEN  --  6.3* 2.2*  --      Julian Hy, DO 10/03/20 3:13 PM [facility]

## 2020-10-03 NOTE — Progress Notes (Signed)
Notified that K is 2.9. Dr. Stanford Breed has ordered lasix and spironolactone. I will order 40 mEq K OTO.

## 2020-10-03 NOTE — Progress Notes (Signed)
[place] for IV Heparin Indication: chest pain/ACS  Allergies  Allergen Reactions   Atorvastatin Other (See Comments)    Myalgias on '80mg'$  dose, tolerates '20mg'$  ok   Crestor [Rosuvastatin Calcium] Other (See Comments)    Severe myalgias and joint pain. Has tolerated atorvastatin though   Monosodium Glutamate Nausea And Vomiting and Other (See Comments)    migraine   Peanut-Containing Drug Products Other (See Comments)    Vomiting, upset stomach and some wheezing   Zetia [Ezetimibe] Other (See Comments)    myalgias   Ciprofloxin Hcl [Ciprofloxacin] Nausea And Vomiting and Other (See Comments)    Severe migraine   Food Color Red [Red Dye] Diarrhea    Patient Measurements: Height: '5\' 5"'$  (165.1 cm) Weight: 62.5 kg (137 lb 12.6 oz) IBW/kg (Calculated) : 57 Heparin Dosing Weight: 62.2 kg  Vital Signs: Temp: 98.6 F (37 C) (09/11 1214) Temp Source: Oral (09/11 1214) BP: 116/84 (09/11 1300) Pulse Rate: 102 (09/11 1300)  Labs: Recent Labs    09/30/20 1658 09/30/20 1853 09/30/20 1947 09/30/20 1948 10/01/20 0057 10/01/20 1553 10/01/20 1911 10/02/20 0443 10/02/20 1342 10/02/20 2223 10/03/20 0652 10/03/20 1214 10/03/20 1448  HGB 12.4  --    < > 13.3 11.3*  --   --   --   --   --  14.0  --   --   HCT 37.6  --    < > 39.0 33.6*  --   --   --   --   --  39.9  --   --   PLT 455*  --   --   --  406*  --   --   --   --   --  480*  --   --   HEPARINUNFRC  --   --   --   --   --   --   --  0.34   < > 0.26* 0.19*  --  0.41  CREATININE 1.67*  --    < >  --   --  1.26*   < > 0.92  --   --  0.93 0.84  --   TROPONINIHS 61* 54*  --   --   --  >24,000*  --   --   --   --   --   --   --    < > = values in this interval not displayed.     Estimated Creatinine Clearance: 71.3 mL/min (by C-G formula based on SCr of 0.84 mg/dL).   Medical History: Past Medical History:  Diagnosis Date   Anemia    Anxiety    Coronary artery disease    a. s/p  PTCA DES x3 in the LAD (ostial DES placed, mid DES placed, distal DES placed) and PTCA/DES x1 to intermediate branch 02/2017. b. ACS 02/08/18 with LCx stent placement.   Depression    Diabetes mellitus    type 1, on insulin pump   DKA (diabetic ketoacidoses) 12/31/2017   Elevated platelet count    High cholesterol    Hypothyroidism    Ischemic cardiomyopathy    a. EF low-normal with basal inferior akinesis, basal septal hypokinesis by echo 01/2018.    Medications:  Scheduled:   Infusions:   cefTRIAXone (ROCEPHIN)  IV 200 mL/hr at 10/03/20 0900   heparin 1,250 Units/hr (10/03/20 0900)   potassium chloride 10 mEq (10/03/20 1520)   sodium chloride      Assessment: 49  years of age female with high sensitivity troponin of >24,000 on IV Heparin for ACS; history hx CAD status post stenting (2019) and CABG (2020).  Not on anticoagulation prior to admission. Patient is noted to be in DKA on insulin drip.   Heparin level 0.41 tonight on 1250 units/hr. No bleeding or IV issues noted.   Goal of Therapy:  Heparin level 0.3-0.7 units/ml Monitor platelets by anticoagulation protocol: Yes   Plan:  Continue IV heparin at current rate. Heparin level in 6 hours and daily with CBC daily  [place] Crane, Roylene Reason, [facility] Clinical Pharmacist  10/03/2020 3:40 PM   [facility] pharmacy phone numbers are listed on [email or web address]

## 2020-10-03 NOTE — Plan of Care (Signed)

## 2020-10-03 NOTE — Progress Notes (Signed)
Progress Note  Patient Name: Jennifer Hogan Date of Encounter: 10/03/2020  [facility] Cardiologist: Fransico Him, MD   Subjective   No CP; dyspnea resolved  Inpatient Medications    Scheduled Meds:  aspirin EC  81 mg Oral Daily   budesonide  0.25 mg Nebulization BID   Chlorhexidine Gluconate Cloth  6 each Topical Daily   clopidogrel  75 mg Oral Daily   FLUoxetine  40 mg Oral Daily   furosemide  20 mg Intravenous Once   insulin aspart  2-6 Units Subcutaneous Q4H   insulin detemir  5 Units Subcutaneous Q12H   levothyroxine  100 mcg Oral Q0600   pantoprazole  40 mg Oral Daily   sucralfate  1 g Oral TID WC & HS   Continuous Infusions:  cefTRIAXone (ROCEPHIN)  IV     heparin 1,100 Units/hr (10/03/20 0010)   insulin Stopped (10/03/20 0011)   sodium chloride     PRN Meds: acetaminophen, albuterol, dextrose, dextrose, hydrOXYzine, labetalol, metoCLOPramide (REGLAN) injection, ondansetron (ZOFRAN) IV   Vital Signs    Vitals:   10/03/20 0600 10/03/20 0615 10/03/20 0630 10/03/20 0645  BP: 123/90     Pulse: (!) 109 (!) 106 (!) 105 (!) 104  Resp: (!) 22 13 (!) 21 17  Temp:      TempSrc:      SpO2: 94% 95% 96% 97%  Weight:      Height:        Intake/Output Summary (Last 24 hours) at 10/03/2020 0708 Last data filed at 10/03/2020 0400 Gross per 24 hour  Intake 151.93 ml  Output 3800 ml  Net -3648.07 ml   Last 3 Weights 10/03/2020 10/01/2020 09/30/2020  Weight (lbs) 137 lb 12.6 oz 137 lb 2 oz 145 lb  Weight (kg) 62.5 kg 62.2 kg 65.772 kg      Telemetry    Sinus with NSVT - Personally Reviewed   Physical Exam   GEN: No acute distress.   Neck: supple Cardiac: RRR, S3 noted Respiratory: CTA GI: Soft, nontender, non-distended  MS: No edema Neuro:  Nonfocal  Psych: Normal affect   Labs    High Sensitivity Troponin:   Recent Labs  Lab 09/30/20 1658 09/30/20 1853 10/01/20 1553  TROPONINIHS 61* 54* >24,000*      Chemistry Recent Labs  Lab  10/01/20 1911 10/01/20 2203 10/02/20 0443  NA 136 137 137  K 4.0 3.8 3.6  CL 106 109 111  CO2 15* 17* 19*  GLUCOSE 189* 177* 130*  BUN 26* 25* 20  CREATININE 1.12* 1.10* 0.92  CALCIUM 8.7* 8.3* 8.1*  GFRNONAA 60* >60 >60  ANIONGAP '15 11 7     '$ Hematology Recent Labs  Lab 09/30/20 1658 09/30/20 1947 09/30/20 1948 10/01/20 0057  WBC 20.1*  --   --  24.9*  RBC 3.94  --   --  3.52*  HGB 12.4 13.3 13.3 11.3*  HCT 37.6 39.0 39.0 33.6*  MCV 95.4  --   --  95.5  MCH 31.5  --   --  32.1  MCHC 33.0  --   --  33.6  RDW 13.4  --   --  13.5  PLT 455*  --   --  406*    Radiology    DG Chest Port 1V same Day  Result Date: 10/02/2020 CLINICAL DATA:  50 year old female with DKA.  Shortness of breath. EXAM: PORTABLE CHEST 1 VIEW COMPARISON:  Chest CTA 09/30/2020 and earlier. FINDINGS: Portable AP semi upright  view at 1103 hours. New confluent bilateral perihilar pulmonary opacity, left greater than right. Stable lung volumes and mediastinal contour. Prior sternotomy. No pneumothorax or pleural effusion. In unaffected areas the pulmonary vascularity appears normal. No acute osseous abnormality identified. IMPRESSION: New extensive left greater than right confluent perihilar lung opacity. Given the recent CTA favor progressed bilateral pneumonia over asymmetric pulmonary edema. Electronically Signed   By: Genevie Ann M.D.   On: 10/02/2020 11:16   ECHOCARDIOGRAM COMPLETE  Result Date: 10/02/2020    ECHOCARDIOGRAM REPORT   Patient Name:   Jennifer Hogan Date of Exam: 10/02/2020 Medical Rec #:  [id number]         Height:       65.0 in Accession #:    [id number]        Weight:       137.1 lb Date of Birth:  [birth year]        BSA:          1.685 m Patient Age:    50 years          BP:           105/68 mmHg Patient Gender: F                 HR:           82 bpm. Exam Location:  Inpatient Procedure: 2D Echo Indications:    NSTEMI  History:        Patient has prior history of Echocardiogram examinations,  most                 recent 11/25/2018. Prior CABG; Risk Factors:Diabetes.  Sonographer:    Johny Chess RDCS Referring Phys: [place] Comments: Image acquisition challenging due to uncooperative patient. IMPRESSIONS  1. Global hypokinesis with akinesis of all apical segments. Left ventricular ejection fraction, by estimation, is 20 to 25%. The left ventricle has severely decreased function. The left ventricle demonstrates global hypokinesis. Left ventricular diastolic parameters are consistent with Grade I diastolic dysfunction (impaired relaxation). Elevated left ventricular end-diastolic pressure.  2. Right ventricular systolic function is normal. The right ventricular size is normal. There is normal pulmonary artery systolic pressure.  3. A small pericardial effusion is present.  4. The mitral valve is normal in structure. Trivial mitral valve regurgitation. No evidence of mitral stenosis.  5. The aortic valve is tricuspid. Aortic valve regurgitation is not visualized. No aortic stenosis is present.  6. The inferior vena cava is normal in size with greater than 50% respiratory variability, suggesting right atrial pressure of 3 mmHg. Comparison(s): Compared with the echo [id number], systolic function is worse. FINDINGS  Left Ventricle: Global hypokinesis with akinesis of all apical segments. Left ventricular ejection fraction, by estimation, is 20 to 25%. The left ventricle has severely decreased function. The left ventricle demonstrates global hypokinesis. The left ventricular internal cavity size was normal in size. There is no left ventricular hypertrophy. Left ventricular diastolic parameters are consistent with Grade I diastolic dysfunction (impaired relaxation). Elevated left ventricular end-diastolic pressure. Right Ventricle: The right ventricular size is normal. No increase in right ventricular wall thickness. Right ventricular systolic function is normal. There is normal  pulmonary artery systolic pressure. The tricuspid regurgitant velocity is 2.65 m/s, and  with an assumed right atrial pressure of 3 mmHg, the estimated right ventricular systolic pressure is 99991111 mmHg. Left Atrium: Left atrial size was normal in size. Right Atrium: Right atrial size was normal in  size. Pericardium: A small pericardial effusion is present. Mitral Valve: The mitral valve is normal in structure. Trivial mitral valve regurgitation. No evidence of mitral valve stenosis. Tricuspid Valve: The tricuspid valve is normal in structure. Tricuspid valve regurgitation is trivial. No evidence of tricuspid stenosis. Aortic Valve: The aortic valve is tricuspid. Aortic valve regurgitation is not visualized. No aortic stenosis is present. Pulmonic Valve: The pulmonic valve was normal in structure. Pulmonic valve regurgitation is not visualized. No evidence of pulmonic stenosis. Aorta: The aortic root is normal in size and structure. Venous: The inferior vena cava is normal in size with greater than 50% respiratory variability, suggesting right atrial pressure of 3 mmHg. IAS/Shunts: No atrial level shunt detected by color flow Doppler.  LEFT VENTRICLE PLAX 2D LVIDd:         4.30 cm     Diastology LVIDs:         3.70 cm     LV e' medial:    4.35 cm/s LV PW:         0.70 cm     LV E/e' medial:  23.2 LV IVS:        0.80 cm     LV e' lateral:   6.53 cm/s LVOT diam:     2.00 cm     LV E/e' lateral: 15.5 LV SV:         27 LV SV Index:   16 LVOT Area:     3.14 cm  LV Volumes (MOD) LV vol d, MOD A4C: 71.1 ml LV vol s, MOD A4C: 53.5 ml LV SV MOD A4C:     71.1 ml IVC IVC diam: 1.50 cm LEFT ATRIUM         Index LA diam:    2.90 cm 1.72 cm/m  AORTIC VALVE LVOT Vmax:   54.10 cm/s LVOT Vmean:  33.800 cm/s LVOT VTI:    0.086 m  AORTA Ao Root diam: 2.60 cm Ao Asc diam:  3.00 cm MITRAL VALVE                TRICUSPID VALVE MV Area (PHT): 5.38 cm     TR Peak grad:   28.1 mmHg MV Decel Time: 141 msec     TR Vmax:        265.00 cm/s MV  E velocity: 101.00 cm/s                             SHUNTS                             Systemic VTI:  0.09 m                             Systemic Diam: 2.00 cm Skeet Latch MD Electronically signed by Skeet Latch MD Signature Date/Time: 10/02/2020/1:54:13 PM    Final      Patient Profile     50 year old female with past medical history of coronary artery disease status post coronary artery bypass and graft (LIMA to the LAD/ramus intermedius and RIMA to the obtuse marginal), diabetes mellitus, hyperlipidemia admitted with DKA being evaluated for non-ST elevation myocardial infarction.  Patient developed severe respiratory distress on September 10 due to pulmonary edema and was transferred to CCU.  Echocardiogram shows newly reduced LV function with ejection fraction 20 to [id number], grade 1 diastolic dysfunction,  small pericardial effusion.  Assessment & Plan    1 non-ST elevation myocardial infarction-plan to continue aspirin, Plavix and heparin.  Patient is intolerant to statins.  Beta-blocker discontinued in setting of borderline blood pressure and acute CHF.  Plan for cardiac catheterization tomorrow.  The risks and benefits including myocardial infarction, CVA and death previously discussed and she agrees to proceed.  Would not hydrate prior to procedure given acute CHF yesterday.  2 acute systolic congestive heart failure-much improved today.  We will give Lasix 20 mg IV x1 and begin spironolactone 12.5 mg daily.  Follow renal function closely.   3 ischemic cardiomyopathy-Blood pressure was low early a.m. but now improved.  We will ARB in AM if BP allows. Add beta-blocker later once it is clear her CHF has completely resolved and if blood pressure allows.  4 acute kidney injury-in the setting of DKA.  Improved yesterday.  Follow-up creatinine pending this morning.  5 DKA-management per primary service.  6 1 of 2 blood cultures positive for strep-antibiotics per primary care.  7  hyperlipidemia-patient apparently has an intolerance to statins and Zetia.  Following discharge will need to follow-up in the lipid clinic for consideration of Repatha or Praluent.  For questions or updates, please contact [place] Please consult [email or web address] for contact info under        Signed, Kirk Ruths, MD  10/03/2020, 7:08 AM

## 2020-10-03 NOTE — Progress Notes (Signed)
[place] for IV Heparin Indication: chest pain/ACS  Allergies  Allergen Reactions   Atorvastatin Other (See Comments)    Myalgias on '80mg'$  dose, tolerates '20mg'$  ok   Crestor [Rosuvastatin Calcium] Other (See Comments)    Severe myalgias and joint pain. Has tolerated atorvastatin though   Monosodium Glutamate Nausea And Vomiting and Other (See Comments)    migraine   Peanut-Containing Drug Products Other (See Comments)    Vomiting, upset stomach and some wheezing   Zetia [Ezetimibe] Other (See Comments)    myalgias   Ciprofloxin Hcl [Ciprofloxacin] Nausea And Vomiting and Other (See Comments)    Severe migraine   Food Color Red [Red Dye] Diarrhea    Patient Measurements: Height: '5\' 5"'$  (165.1 cm) Weight: 62.2 kg (137 lb 2 oz) IBW/kg (Calculated) : 57 Heparin Dosing Weight: 62.2 kg  Vital Signs: Temp: 98.1 F (36.7 C) (09/10 1929) Temp Source: Oral (09/10 1929) BP: 124/85 (09/10 2200) Pulse Rate: 102 (09/10 2200)  Labs: Recent Labs    09/30/20 1658 09/30/20 1853 09/30/20 1947 09/30/20 1948 10/01/20 0057 10/01/20 1553 10/01/20 1911 10/01/20 2203 10/02/20 0443 10/02/20 1342 10/02/20 2223  HGB 12.4  --  13.3 13.3 11.3*  --   --   --   --   --   --   HCT 37.6  --  39.0 39.0 33.6*  --   --   --   --   --   --   PLT 455*  --   --   --  406*  --   --   --   --   --   --   HEPARINUNFRC  --   --   --   --   --   --   --   --  0.34 <0.10* 0.26*  CREATININE 1.67*  --  1.90*  --   --  1.26* 1.12* 1.10* 0.92  --   --   TROPONINIHS 61* 54*  --   --   --  >24,000*  --   --   --   --   --      Estimated Creatinine Clearance: 65.1 mL/min (by C-G formula based on SCr of 0.92 mg/dL).   Medical History: Past Medical History:  Diagnosis Date   Anemia    Anxiety    Coronary artery disease    a. s/p PTCA DES x3 in the LAD (ostial DES placed, mid DES placed, distal DES placed) and PTCA/DES x1 to intermediate branch 02/2017. b. ACS 02/08/18  with LCx stent placement.   Depression    Diabetes mellitus    type 1, on insulin pump   DKA (diabetic ketoacidoses) 12/31/2017   Elevated platelet count    High cholesterol    Hypothyroidism    Ischemic cardiomyopathy    a. EF low-normal with basal inferior akinesis, basal septal hypokinesis by echo 01/2018.    Medications:  Scheduled:   aspirin EC  81 mg Oral Daily   budesonide  0.25 mg Nebulization BID   Chlorhexidine Gluconate Cloth  6 each Topical Daily   clopidogrel  75 mg Oral Daily   FLUoxetine  40 mg Oral Daily   furosemide       furosemide  20 mg Intravenous Once   insulin aspart  2-6 Units Subcutaneous Q4H   insulin detemir  5 Units Subcutaneous Q12H   levothyroxine  100 mcg Oral Q0600   pantoprazole  40 mg Oral Daily  sucralfate  1 g Oral TID WC & HS   Infusions:   heparin 950 Units/hr (10/02/20 2000)   insulin 1.5 Units/hr (10/02/20 2219)   sodium chloride      Assessment: 50 years of age female with high sensitivity troponin of >24,000 on IV Heparin for ACS; history hx CAD status post stenting (2019) and CABG (2020).  Not on anticoagulation prior to admission. Patient is noted to be in DKA on insulin drip.    Heparin level 0.26 tonight on 950 units/hr. No bleeding or IV issues noted.   Goal of Therapy:  Heparin level 0.3-0.7 units/ml Monitor platelets by anticoagulation protocol: Yes   Plan:  Increase heparin infusion to 1100 units/hr Heparin level in 6 hours and daily wth CBC daily  Erin Hearing PharmD., BCPS Clinical Pharmacist 10/03/2020 12:05 AM

## 2020-10-04 ENCOUNTER — Encounter (HOSPITAL_COMMUNITY): Payer: Self-pay | Admitting: Cardiovascular Disease

## 2020-10-04 ENCOUNTER — Encounter (HOSPITAL_COMMUNITY)
Admission: EM | Disposition: A | Discharge status: Home / Self Care | Payer: Self-pay | Source: Home / Self Care | Attending: Internal Medicine

## 2020-10-04 DIAGNOSIS — I2510 Atherosclerotic heart disease of native coronary artery without angina pectoris: Secondary | ICD-10-CM

## 2020-10-04 DIAGNOSIS — N170 Acute kidney failure with tubular necrosis: Secondary | ICD-10-CM

## 2020-10-04 DIAGNOSIS — I2583 Coronary atherosclerosis due to lipid rich plaque: Secondary | ICD-10-CM

## 2020-10-04 DIAGNOSIS — I420 Dilated cardiomyopathy: Secondary | ICD-10-CM

## 2020-10-04 DIAGNOSIS — E7800 Pure hypercholesterolemia, unspecified: Secondary | ICD-10-CM

## 2020-10-04 DIAGNOSIS — I255 Ischemic cardiomyopathy: Secondary | ICD-10-CM

## 2020-10-04 HISTORY — PX: LEFT HEART CATH AND CORS/GRAFTS ANGIOGRAPHY: CATH118250

## 2020-10-04 LAB — BASIC METABOLIC PANEL
Anion gap: 12 (ref 5–15)
BUN: 8 mg/dL (ref 6–20)
CO2: 23 mmol/L (ref 22–32)
Calcium: 8.6 mg/dL — ABNORMAL LOW (ref 8.9–10.3)
Chloride: 99 mmol/L (ref 98–111)
Creatinine, Ser: 0.96 mg/dL (ref 0.44–1.00)
GFR, Estimated: >60 mL/min (ref >60)
Glucose, Bld: 161 mg/dL — ABNORMAL HIGH (ref 70–99)
Potassium: 3.5 mmol/L (ref 3.5–5.1)
Sodium: 134 mmol/L — ABNORMAL LOW (ref 135–145)

## 2020-10-04 LAB — HEPARIN LEVEL (UNFRACTIONATED): Heparin Unfractionated: 0.36 IU/mL (ref 0.30–0.70)

## 2020-10-04 LAB — CBC
HCT: 37.7 % (ref 36.0–46.0)
Hemoglobin: 12.8 g/dL (ref 12.0–15.0)
MCH: 31.7 pg (ref 26.0–34.0)
MCHC: 34 g/dL (ref 30.0–36.0)
MCV: 93.3 fL (ref 80.0–100.0)
Platelets: 470 10*3/uL — ABNORMAL HIGH (ref 150–400)
RBC: 4.04 MIL/uL (ref 3.87–5.11)
RDW: 13.4 % (ref 11.5–15.5)
WBC: 8.3 10*3/uL (ref 4.0–10.5)
nRBC: 0 % (ref 0.0–0.2)

## 2020-10-04 LAB — GLUCOSE, CAPILLARY
Glucose-Capillary: 107 mg/dL — ABNORMAL HIGH (ref 70–99)
Glucose-Capillary: 123 mg/dL — ABNORMAL HIGH (ref 70–99)
Glucose-Capillary: 187 mg/dL — ABNORMAL HIGH (ref 70–99)
Glucose-Capillary: 205 mg/dL — ABNORMAL HIGH (ref 70–99)
Glucose-Capillary: 210 mg/dL — ABNORMAL HIGH (ref 70–99)
Glucose-Capillary: 74 mg/dL (ref 70–99)
Glucose-Capillary: 83 mg/dL (ref 70–99)
Glucose-Capillary: 94 mg/dL (ref 70–99)

## 2020-10-04 LAB — MAGNESIUM: Magnesium: 1.9 mg/dL (ref 1.7–2.4)

## 2020-10-04 LAB — CULTURE, BLOOD (ROUTINE X 2)

## 2020-10-04 SURGERY — LEFT HEART CATH AND CORS/GRAFTS ANGIOGRAPHY
Anesthesia: LOCAL

## 2020-10-04 MED ORDER — ASPIRIN 81 MG PO CHEW: 81.0000 mg | CHEWABLE_TABLET | ORAL | Status: DC

## 2020-10-04 MED ORDER — SODIUM CHLORIDE 0.9 % IV SOLN: INTRAVENOUS | Status: DC

## 2020-10-04 MED ORDER — SODIUM CHLORIDE 0.9 % IV SOLN: 250.0000 mL | INTRAVENOUS | Status: DC | PRN

## 2020-10-04 MED ORDER — HEPARIN (PORCINE) IN NACL 1000-0.9 UT/500ML-% IV SOLN
INTRAVENOUS | Status: DC | PRN
  Administered 2020-10-04 (×2): 500 mL

## 2020-10-04 MED ORDER — SODIUM CHLORIDE 0.9% FLUSH
3.0000 mL | Freq: Two times a day (BID) | INTRAVENOUS | Status: DC
  Administered 2020-10-04: 3 mL via INTRAVENOUS

## 2020-10-04 MED ORDER — LIDOCAINE HCL (PF) 1 % IJ SOLN
INTRAMUSCULAR | Status: DC | PRN
  Administered 2020-10-04: 15 mL

## 2020-10-04 MED ORDER — SODIUM CHLORIDE 0.9% FLUSH: 3.0000 mL | INTRAVENOUS | Status: DC | PRN

## 2020-10-04 MED ORDER — FENTANYL CITRATE (PF) 100 MCG/2ML IJ SOLN
INTRAMUSCULAR | Status: DC | PRN
  Administered 2020-10-04: 25 ug via INTRAVENOUS

## 2020-10-04 MED ORDER — IOHEXOL 350 MG/ML SOLN
INTRAVENOUS | Status: DC | PRN
  Administered 2020-10-04: 75 mL

## 2020-10-04 MED ORDER — SODIUM CHLORIDE 0.9 % IV SOLN: INTRAVENOUS | Status: AC

## 2020-10-04 MED ORDER — HYDRALAZINE HCL 20 MG/ML IJ SOLN: 10.0000 mg | INTRAMUSCULAR | Status: AC | PRN

## 2020-10-04 MED ORDER — MIDAZOLAM HCL 2 MG/2ML IJ SOLN
INTRAMUSCULAR | Status: DC | PRN
  Administered 2020-10-04: 2 mg via INTRAVENOUS

## 2020-10-04 MED ORDER — LIDOCAINE HCL (PF) 1 % IJ SOLN
INTRAMUSCULAR | Status: AC
  Filled 2020-10-04: qty 30

## 2020-10-04 MED ORDER — FENTANYL CITRATE (PF) 100 MCG/2ML IJ SOLN
INTRAMUSCULAR | Status: AC
  Filled 2020-10-04: qty 2

## 2020-10-04 MED ORDER — HEPARIN (PORCINE) IN NACL 1000-0.9 UT/500ML-% IV SOLN
INTRAVENOUS | Status: AC
  Filled 2020-10-04: qty 1000

## 2020-10-04 MED ORDER — INSULIN ASPART 100 UNIT/ML IJ SOLN
2.0000 [IU] | Freq: Three times a day (TID) | INTRAMUSCULAR | Status: DC
  Administered 2020-10-04: 6 [IU] via SUBCUTANEOUS

## 2020-10-04 MED ORDER — MIDAZOLAM HCL 2 MG/2ML IJ SOLN
INTRAMUSCULAR | Status: AC
  Filled 2020-10-04: qty 2

## 2020-10-04 SURGICAL SUPPLY — 9 items
CATH INFINITI 5FR MULTPACK ANG (CATHETERS) ×1 IMPLANT
CLOSURE MYNX CONTROL 5F (Vascular Products) ×1 IMPLANT
KIT HEART LEFT (KITS) ×2 IMPLANT
KIT MICROPUNCTURE NIT STIFF (SHEATH) ×1 IMPLANT
PACK CARDIAC CATHETERIZATION (CUSTOM PROCEDURE TRAY) ×2 IMPLANT
SHEATH PINNACLE 5F 10CM (SHEATH) ×1 IMPLANT
TRANSDUCER W/STOPCOCK (MISCELLANEOUS) ×2 IMPLANT
TUBING CIL FLEX 10 FLL-RA (TUBING) ×2 IMPLANT
WIRE EMERALD 3MM-J .035X150CM (WIRE) ×1 IMPLANT

## 2020-10-04 NOTE — H&P (View-Only) (Signed)
Progress Note  Patient Name: Jennifer Hogan Date of Encounter: 10/04/2020  [facility] Cardiologist: Fransico Him, MD   Subjective   dyspnea resolved.  Noted to have elevated sed rate last night with pleuritic CP and small PE on echo.  Plan for LHC today to define coronary anatomy and graft patency.  Denies any CP.  Inpatient Medications    Scheduled Meds:  aspirin  81 mg Oral Pre-Cath   aspirin EC  81 mg Oral Daily   atorvastatin  20 mg Oral Daily   budesonide  0.25 mg Nebulization BID   Chlorhexidine Gluconate Cloth  6 each Topical Daily   clopidogrel  75 mg Oral Daily   FLUoxetine  40 mg Oral Daily   insulin aspart  2-6 Units Subcutaneous Q4H   insulin detemir  7 Units Subcutaneous Q12H   levothyroxine  100 mcg Oral Q0600   metoprolol tartrate  12.5 mg Oral BID   pantoprazole  40 mg Oral Daily   sodium chloride flush  3 mL Intravenous Q12H   spironolactone  12.5 mg Oral Daily   sucralfate  1 g Oral TID WC & HS   Continuous Infusions:  sodium chloride     sodium chloride 10 mL/hr at 10/04/20 0806   cefTRIAXone (ROCEPHIN)  IV 2 g (10/04/20 0808)   heparin 1,250 Units/hr (10/04/20 0800)   sodium chloride     PRN Meds: sodium chloride, acetaminophen, albuterol, dextrose, dextrose, hydrOXYzine, labetalol, metoCLOPramide (REGLAN) injection, nitroGLYCERIN, ondansetron (ZOFRAN) IV, sodium chloride flush   Vital Signs    Vitals:   10/04/20 0615 10/04/20 0742 10/04/20 0815 10/04/20 0958  BP:   94/60   Pulse:   70 73  Resp: _0 Temp:  98.2 F (36.8 C) 98.2 F (36.8 C)   TempSrc:  Oral Oral   SpO2:   97% 96%  Weight:      Height:        Intake/Output Summary (Last 24 hours) at 10/04/2020 1002 Last data filed at 10/04/2020 0800 Gross per 24 hour  Intake 949.62 ml  Output --  Net 949.62 ml    Last 3 Weights 10/03/2020 10/01/2020 09/30/2020  Weight (lbs) 137 lb 12.6 oz 137 lb 2 oz 145 lb  Weight (kg) 62.5 kg 62.2 kg 65.772 kg      Telemetry    NSR -  Personally Reviewed   Physical Exam  GEN: Well nourished, well developed in no acute distress HEENT: Normal NECK: No JVD; No carotid bruits LYMPHATICS: No lymphadenopathy CARDIAC:RRR, no murmurs, rubs, gallops RESPIRATORY:  Clear to auscultation without rales, wheezing or rhonchi  ABDOMEN: Soft, non-tender, non-distended MUSCULOSKELETAL:  No edema; No deformity  SKIN: Warm and dry NEUROLOGIC:  Alert and oriented x 3 PSYCHIATRIC:  Normal affect   Labs    High Sensitivity Troponin:   Recent Labs  Lab 09/30/20 1658 09/30/20 1853 10/01/20 1553  TROPONINIHS 61* 54* >24,000*       Chemistry Recent Labs  Lab 10/03/20 1214 10/03/20 1448 10/04/20 0009  NA 135 135 134*  K 3.5 3.4* 3.5  CL 98 98 99  CO2 _1 GLUCOSE 133* 143* 161*  BUN _2 CREATININE 0.84 0.80 0.96  CALCIUM 8.2* 8.6* 8.6*  GFRNONAA >60 >60 >60  ANIONGAP _3 Hematology Recent Labs  Lab 10/01/20 0057 10/03/20 0652 10/04/20 0009  WBC 24.9* 12.2* 8.3  RBC 3.52* 4.35 4.04  HGB 11.3* 14.0  12.8  HCT 33.6* 39.9 37.7  MCV 95.5 91.7 93.3  MCH 32.1 32.2 31.7  MCHC 33.6 35.1 34.0  RDW 13.5 13.5 13.4  PLT 406* 480* 470*     Radiology    DG Chest Port 1V same Day  Result Date: 10/02/2020 CLINICAL DATA:  50 year old female with DKA.  Shortness of breath. EXAM: PORTABLE CHEST 1 VIEW COMPARISON:  Chest CTA 09/30/2020 and earlier. FINDINGS: Portable AP semi upright view at 1103 hours. New confluent bilateral perihilar pulmonary opacity, left greater than right. Stable lung volumes and mediastinal contour. Prior sternotomy. No pneumothorax or pleural effusion. In unaffected areas the pulmonary vascularity appears normal. No acute osseous abnormality identified. IMPRESSION: New extensive left greater than right confluent perihilar lung opacity. Given the recent CTA favor progressed bilateral pneumonia over asymmetric pulmonary edema. Electronically Signed   By: Genevie Ann M.D.   On: 10/02/2020  11:16   ECHOCARDIOGRAM COMPLETE  Result Date: 10/02/2020    ECHOCARDIOGRAM REPORT   Patient Name:   Jennifer Hogan Date of Exam: 10/02/2020 Medical Rec #:  [id number]         Height:       65.0 in Accession #:    [id number]        Weight:       137.1 lb Date of Birth:  July 25, 1970        BSA:          1.685 m Patient Age:    10 years          BP:           105/68 mmHg Patient Gender: F                 HR:           82 bpm. Exam Location:  Inpatient Procedure: 2D Echo Indications:    NSTEMI  History:        Patient has prior history of Echocardiogram examinations, most                 recent 11/25/2018. Prior CABG; Risk Factors:Diabetes.  Sonographer:    Johny Chess RDCS Referring Phys: [place] Comments: Image acquisition challenging due to uncooperative patient. IMPRESSIONS  1. Global hypokinesis with akinesis of all apical segments. Left ventricular ejection fraction, by estimation, is 20 to 25%. The left ventricle has severely decreased function. The left ventricle demonstrates global hypokinesis. Left ventricular diastolic parameters are consistent with Grade I diastolic dysfunction (impaired relaxation). Elevated left ventricular end-diastolic pressure.  2. Right ventricular systolic function is normal. The right ventricular size is normal. There is normal pulmonary artery systolic pressure.  3. A small pericardial effusion is present.  4. The mitral valve is normal in structure. Trivial mitral valve regurgitation. No evidence of mitral stenosis.  5. The aortic valve is tricuspid. Aortic valve regurgitation is not visualized. No aortic stenosis is present.  6. The inferior vena cava is normal in size with greater than 50% respiratory variability, suggesting right atrial pressure of 3 mmHg. Comparison(s): Compared with the echo 54/6503, systolic function is worse. FINDINGS  Left Ventricle: Global hypokinesis with akinesis of all apical segments. Left ventricular ejection  fraction, by estimation, is 20 to 25%. The left ventricle has severely decreased function. The left ventricle demonstrates global hypokinesis. The left ventricular internal cavity size was normal in size. There is no left ventricular hypertrophy. Left ventricular diastolic parameters are consistent with Grade I diastolic dysfunction (impaired relaxation).  Elevated left ventricular end-diastolic pressure. Right Ventricle: The right ventricular size is normal. No increase in right ventricular wall thickness. Right ventricular systolic function is normal. There is normal pulmonary artery systolic pressure. The tricuspid regurgitant velocity is 2.65 m/s, and  with an assumed right atrial pressure of 3 mmHg, the estimated right ventricular systolic pressure is 40.3 mmHg. Left Atrium: Left atrial size was normal in size. Right Atrium: Right atrial size was normal in size. Pericardium: A small pericardial effusion is present. Mitral Valve: The mitral valve is normal in structure. Trivial mitral valve regurgitation. No evidence of mitral valve stenosis. Tricuspid Valve: The tricuspid valve is normal in structure. Tricuspid valve regurgitation is trivial. No evidence of tricuspid stenosis. Aortic Valve: The aortic valve is tricuspid. Aortic valve regurgitation is not visualized. No aortic stenosis is present. Pulmonic Valve: The pulmonic valve was normal in structure. Pulmonic valve regurgitation is not visualized. No evidence of pulmonic stenosis. Aorta: The aortic root is normal in size and structure. Venous: The inferior vena cava is normal in size with greater than 50% respiratory variability, suggesting right atrial pressure of 3 mmHg. IAS/Shunts: No atrial level shunt detected by color flow Doppler.  LEFT VENTRICLE PLAX 2D LVIDd:         4.30 cm     Diastology LVIDs:         3.70 cm     LV e' medial:    4.35 cm/s LV PW:         0.70 cm     LV E/e' medial:  23.2 LV IVS:        0.80 cm     LV e' lateral:   6.53 cm/s LVOT  diam:     2.00 cm     LV E/e' lateral: 15.5 LV SV:         27 LV SV Index:   16 LVOT Area:     3.14 cm  LV Volumes (MOD) LV vol d, MOD A4C: 71.1 ml LV vol s, MOD A4C: 53.5 ml LV SV MOD A4C:     71.1 ml IVC IVC diam: 1.50 cm LEFT ATRIUM         Index LA diam:    2.90 cm 1.72 cm/m  AORTIC VALVE LVOT Vmax:   54.10 cm/s LVOT Vmean:  33.800 cm/s LVOT VTI:    0.086 m  AORTA Ao Root diam: 2.60 cm Ao Asc diam:  3.00 cm MITRAL VALVE                TRICUSPID VALVE MV Area (PHT): 5.38 cm     TR Peak grad:   28.1 mmHg MV Decel Time: 141 msec     TR Vmax:        265.00 cm/s MV E velocity: 101.00 cm/s                             SHUNTS                             Systemic VTI:  0.09 m                             Systemic Diam: 2.00 cm Skeet Latch MD Electronically signed by Skeet Latch MD Signature Date/Time: 10/02/2020/1:54:13 PM    Final      Patient Profile  50 year old female with past medical history of coronary artery disease status post coronary artery bypass and graft (LIMA to the LAD/ramus intermedius and RIMA to the obtuse marginal), diabetes mellitus, hyperlipidemia admitted with DKA being evaluated for non-ST elevation myocardial infarction.  Patient developed severe respiratory distress on September 10 due to pulmonary edema and was transferred to CCU.  Echocardiogram shows newly reduced LV function with ejection fraction 20 to 33%, grade 1 diastolic dysfunction, small pericardial effusion.  Assessment & Plan    Non-ST elevation myocardial infarction -no further CP but has had sharp stabbing CP yesterday but has had chest pressure over the past 2 weeks PTA with diaphoresis -echo this admit with new LV dysfunction -pleuritic CH yesterday with elevated ESR ? Acute pericarditis but pain is different than her typical angina she has had over the past 2 weeks -ESR is very nonspecific and since she has not had any further CP at this time will hold off for now with Colchicine -she is NPO for LHC  today -plan to continue aspirin, Plavix , Lopressor 12.46m BID and heparin.   -Patient is intolerant to statins in the past but started on Atorvastatin 2645mthis admit -plan for LHC  today to redefine coronary anatomy and patency of grafts  Acute systolic congestive heart failure -much improved today.   -Continue Lopressor 12.45m17mID and spiro 12.45mg74mily  Ischemic cardiomyopathy- Blood pressure stable -consider addition of low dose ARB post cath if Bp and renal function stable -continue spiro 12.45mg 60mly and Lopressor 12.45mg B8m Acute kidney injury -in the setting of DKA.   -SCr stable at 0.96 today  DKA -management per primary service.  1 of 2 blood cultures positive for strep -antibiotics per primary care.  Hyperlipidemia -patient apparently has an intolerance to statins and Zetia.   -Following discharge will need to follow-up in the lipid clinic for consideration of Repatha or Praluent as I do not think her LDL, which is 166, will get to goal on low dose statin  I have spent a total of 35 minutes with patient reviewing 2D echo, hospital notes , telemetry, EKGs, labs and examining patient as well as establishing an assessment and plan that was discussed with the patient.  > 50% of time was spent in direct patient care.     For questions or updates, please contact [facility] consult [email or web address] for contact info under        Signed, Levie Owensby Fransico Him9/12/2020, 10:02 AM

## 2020-10-04 NOTE — Plan of Care (Signed)
  Problem: Education: Goal: Knowledge of General Education information will improve Description: Including pain rating scale, medication(s)/side effects and non-pharmacologic comfort measures Outcome: Progressing   Problem: Health Behavior/Discharge Planning: Goal: Ability to manage health-related needs will improve Outcome: Progressing   Problem: Clinical Measurements: Goal: Ability to maintain clinical measurements within normal limits will improve Outcome: Progressing Goal: Will remain free from infection Outcome: Progressing Goal: Diagnostic test results will improve Outcome: Progressing Goal: Respiratory complications will improve Outcome: Progressing Goal: Cardiovascular complication will be avoided Outcome: Progressing   Problem: Activity: Goal: Risk for activity intolerance will decrease Outcome: Progressing   Problem: Coping: Goal: Level of anxiety will decrease Outcome: Progressing   Problem: Elimination: Goal: Will not experience complications related to bowel motility Outcome: Progressing Goal: Will not experience complications related to urinary retention Outcome: Progressing   Problem: Skin Integrity: Goal: Risk for impaired skin integrity will decrease Outcome: Progressing   Problem: Education: Goal: Ability to describe self-care measures that may prevent or decrease complications (Diabetes Survival Skills Education) will improve Outcome: Progressing Goal: Individualized Educational Video(s) Outcome: Progressing   Problem: Coping: Goal: Ability to adjust to condition or change in health will improve Outcome: Progressing

## 2020-10-04 NOTE — Progress Notes (Addendum)
Progress Note  Patient Name: Jennifer Hogan Date of Encounter: 10/04/2020  [facility] Cardiologist: Fransico Him, MD   Subjective   dyspnea resolved.  Noted to have elevated sed rate last night with pleuritic CP and small PE on echo.  Plan for LHC today to define coronary anatomy and graft patency.  Denies any CP.  Inpatient Medications    Scheduled Meds:  aspirin  81 mg Oral Pre-Cath   aspirin EC  81 mg Oral Daily   atorvastatin  20 mg Oral Daily   budesonide  0.25 mg Nebulization BID   Chlorhexidine Gluconate Cloth  6 each Topical Daily   clopidogrel  75 mg Oral Daily   FLUoxetine  40 mg Oral Daily   insulin aspart  2-6 Units Subcutaneous Q4H   insulin detemir  7 Units Subcutaneous Q12H   levothyroxine  100 mcg Oral Q0600   metoprolol tartrate  12.5 mg Oral BID   pantoprazole  40 mg Oral Daily   sodium chloride flush  3 mL Intravenous Q12H   spironolactone  12.5 mg Oral Daily   sucralfate  1 g Oral TID WC & HS   Continuous Infusions:  sodium chloride     sodium chloride 10 mL/hr at 10/04/20 0806   cefTRIAXone (ROCEPHIN)  IV 2 g (10/04/20 0808)   heparin 1,250 Units/hr (10/04/20 0800)   sodium chloride     PRN Meds: sodium chloride, acetaminophen, albuterol, dextrose, dextrose, hydrOXYzine, labetalol, metoCLOPramide (REGLAN) injection, nitroGLYCERIN, ondansetron (ZOFRAN) IV, sodium chloride flush   Vital Signs    Vitals:   10/04/20 0615 10/04/20 0742 10/04/20 0815 10/04/20 0958  BP:   94/60   Pulse:   70 73  Resp: _0 Temp:  98.2 F (36.8 C) 98.2 F (36.8 C)   TempSrc:  Oral Oral   SpO2:   97% 96%  Weight:      Height:        Intake/Output Summary (Last 24 hours) at 10/04/2020 1002 Last data filed at 10/04/2020 0800 Gross per 24 hour  Intake 949.62 ml  Output --  Net 949.62 ml    Last 3 Weights 10/03/2020 10/01/2020 09/30/2020  Weight (lbs) 137 lb 12.6 oz 137 lb 2 oz 145 lb  Weight (kg) 62.5 kg 62.2 kg 65.772 kg      Telemetry    NSR -  Personally Reviewed   Physical Exam  GEN: Well nourished, well developed in no acute distress HEENT: Normal NECK: No JVD; No carotid bruits LYMPHATICS: No lymphadenopathy CARDIAC:RRR, no murmurs, rubs, gallops RESPIRATORY:  Clear to auscultation without rales, wheezing or rhonchi  ABDOMEN: Soft, non-tender, non-distended MUSCULOSKELETAL:  No edema; No deformity  SKIN: Warm and dry NEUROLOGIC:  Alert and oriented x 3 PSYCHIATRIC:  Normal affect   Labs    High Sensitivity Troponin:   Recent Labs  Lab 09/30/20 1658 09/30/20 1853 10/01/20 1553  TROPONINIHS 61* 54* >24,000*       Chemistry Recent Labs  Lab 10/03/20 1214 10/03/20 1448 10/04/20 0009  NA 135 135 134*  K 3.5 3.4* 3.5  CL 98 98 99  CO2 _1 GLUCOSE 133* 143* 161*  BUN _2 CREATININE 0.84 0.80 0.96  CALCIUM 8.2* 8.6* 8.6*  GFRNONAA >60 >60 >60  ANIONGAP _3 Hematology Recent Labs  Lab 10/01/20 0057 10/03/20 0652 10/04/20 0009  WBC 24.9* 12.2* 8.3  RBC 3.52* 4.35 4.04  HGB 11.3* 14.0  12.8  HCT 33.6* 39.9 37.7  MCV 95.5 91.7 93.3  MCH 32.1 32.2 31.7  MCHC 33.6 35.1 34.0  RDW 13.5 13.5 13.4  PLT 406* 480* 470*     Radiology    DG Chest Port 1V same Day  Result Date: 10/02/2020 CLINICAL DATA:  50 year old female with DKA.  Shortness of breath. EXAM: PORTABLE CHEST 1 VIEW COMPARISON:  Chest CTA 09/30/2020 and earlier. FINDINGS: Portable AP semi upright view at 1103 hours. New confluent bilateral perihilar pulmonary opacity, left greater than right. Stable lung volumes and mediastinal contour. Prior sternotomy. No pneumothorax or pleural effusion. In unaffected areas the pulmonary vascularity appears normal. No acute osseous abnormality identified. IMPRESSION: New extensive left greater than right confluent perihilar lung opacity. Given the recent CTA favor progressed bilateral pneumonia over asymmetric pulmonary edema. Electronically Signed   By: Genevie Ann M.D.   On: 10/02/2020  11:16   ECHOCARDIOGRAM COMPLETE  Result Date: 10/02/2020    ECHOCARDIOGRAM REPORT   Patient Name:   Jennifer Hogan Date of Exam: 10/02/2020 Medical Rec #:  [id number]         Height:       65.0 in Accession #:    [id number]        Weight:       137.1 lb Date of Birth:  July 25, 1970        BSA:          1.685 m Patient Age:    10 years          BP:           105/68 mmHg Patient Gender: F                 HR:           82 bpm. Exam Location:  Inpatient Procedure: 2D Echo Indications:    NSTEMI  History:        Patient has prior history of Echocardiogram examinations, most                 recent 11/25/2018. Prior CABG; Risk Factors:Diabetes.  Sonographer:    Johny Chess RDCS Referring Phys: [place] Comments: Image acquisition challenging due to uncooperative patient. IMPRESSIONS  1. Global hypokinesis with akinesis of all apical segments. Left ventricular ejection fraction, by estimation, is 20 to 25%. The left ventricle has severely decreased function. The left ventricle demonstrates global hypokinesis. Left ventricular diastolic parameters are consistent with Grade I diastolic dysfunction (impaired relaxation). Elevated left ventricular end-diastolic pressure.  2. Right ventricular systolic function is normal. The right ventricular size is normal. There is normal pulmonary artery systolic pressure.  3. A small pericardial effusion is present.  4. The mitral valve is normal in structure. Trivial mitral valve regurgitation. No evidence of mitral stenosis.  5. The aortic valve is tricuspid. Aortic valve regurgitation is not visualized. No aortic stenosis is present.  6. The inferior vena cava is normal in size with greater than 50% respiratory variability, suggesting right atrial pressure of 3 mmHg. Comparison(s): Compared with the echo 54/6503, systolic function is worse. FINDINGS  Left Ventricle: Global hypokinesis with akinesis of all apical segments. Left ventricular ejection  fraction, by estimation, is 20 to 25%. The left ventricle has severely decreased function. The left ventricle demonstrates global hypokinesis. The left ventricular internal cavity size was normal in size. There is no left ventricular hypertrophy. Left ventricular diastolic parameters are consistent with Grade I diastolic dysfunction (impaired relaxation).  Elevated left ventricular end-diastolic pressure. Right Ventricle: The right ventricular size is normal. No increase in right ventricular wall thickness. Right ventricular systolic function is normal. There is normal pulmonary artery systolic pressure. The tricuspid regurgitant velocity is 2.65 m/s, and  with an assumed right atrial pressure of 3 mmHg, the estimated right ventricular systolic pressure is 40.3 mmHg. Left Atrium: Left atrial size was normal in size. Right Atrium: Right atrial size was normal in size. Pericardium: A small pericardial effusion is present. Mitral Valve: The mitral valve is normal in structure. Trivial mitral valve regurgitation. No evidence of mitral valve stenosis. Tricuspid Valve: The tricuspid valve is normal in structure. Tricuspid valve regurgitation is trivial. No evidence of tricuspid stenosis. Aortic Valve: The aortic valve is tricuspid. Aortic valve regurgitation is not visualized. No aortic stenosis is present. Pulmonic Valve: The pulmonic valve was normal in structure. Pulmonic valve regurgitation is not visualized. No evidence of pulmonic stenosis. Aorta: The aortic root is normal in size and structure. Venous: The inferior vena cava is normal in size with greater than 50% respiratory variability, suggesting right atrial pressure of 3 mmHg. IAS/Shunts: No atrial level shunt detected by color flow Doppler.  LEFT VENTRICLE PLAX 2D LVIDd:         4.30 cm     Diastology LVIDs:         3.70 cm     LV e' medial:    4.35 cm/s LV PW:         0.70 cm     LV E/e' medial:  23.2 LV IVS:        0.80 cm     LV e' lateral:   6.53 cm/s LVOT  diam:     2.00 cm     LV E/e' lateral: 15.5 LV SV:         27 LV SV Index:   16 LVOT Area:     3.14 cm  LV Volumes (MOD) LV vol d, MOD A4C: 71.1 ml LV vol s, MOD A4C: 53.5 ml LV SV MOD A4C:     71.1 ml IVC IVC diam: 1.50 cm LEFT ATRIUM         Index LA diam:    2.90 cm 1.72 cm/m  AORTIC VALVE LVOT Vmax:   54.10 cm/s LVOT Vmean:  33.800 cm/s LVOT VTI:    0.086 m  AORTA Ao Root diam: 2.60 cm Ao Asc diam:  3.00 cm MITRAL VALVE                TRICUSPID VALVE MV Area (PHT): 5.38 cm     TR Peak grad:   28.1 mmHg MV Decel Time: 141 msec     TR Vmax:        265.00 cm/s MV E velocity: 101.00 cm/s                             SHUNTS                             Systemic VTI:  0.09 m                             Systemic Diam: 2.00 cm Skeet Latch MD Electronically signed by Skeet Latch MD Signature Date/Time: 10/02/2020/1:54:13 PM    Final      Patient Profile  50 year old female with past medical history of coronary artery disease status post coronary artery bypass and graft (LIMA to the LAD/ramus intermedius and RIMA to the obtuse marginal), diabetes mellitus, hyperlipidemia admitted with DKA being evaluated for non-ST elevation myocardial infarction.  Patient developed severe respiratory distress on September 10 due to pulmonary edema and was transferred to CCU.  Echocardiogram shows newly reduced LV function with ejection fraction 20 to 33%, grade 1 diastolic dysfunction, small pericardial effusion.  Assessment & Plan    Non-ST elevation myocardial infarction -no further CP but has had sharp stabbing CP yesterday but has had chest pressure over the past 2 weeks PTA with diaphoresis -echo this admit with new LV dysfunction -pleuritic CH yesterday with elevated ESR ? Acute pericarditis but pain is different than her typical angina she has had over the past 2 weeks -ESR is very nonspecific and since she has not had any further CP at this time will hold off for now with Colchicine -she is NPO for LHC  today -plan to continue aspirin, Plavix , Lopressor 12.46m BID and heparin.   -Patient is intolerant to statins in the past but started on Atorvastatin 2645mthis admit -plan for LHC  today to redefine coronary anatomy and patency of grafts  Acute systolic congestive heart failure -much improved today.   -Continue Lopressor 12.45m17mID and spiro 12.45mg74mily  Ischemic cardiomyopathy- Blood pressure stable -consider addition of low dose ARB post cath if Bp and renal function stable -continue spiro 12.45mg 60mly and Lopressor 12.45mg B8m Acute kidney injury -in the setting of DKA.   -SCr stable at 0.96 today  DKA -management per primary service.  1 of 2 blood cultures positive for strep -antibiotics per primary care.  Hyperlipidemia -patient apparently has an intolerance to statins and Zetia.   -Following discharge will need to follow-up in the lipid clinic for consideration of Repatha or Praluent as I do not think her LDL, which is 166, will get to goal on low dose statin  I have spent a total of 35 minutes with patient reviewing 2D echo, hospital notes , telemetry, EKGs, labs and examining patient as well as establishing an assessment and plan that was discussed with the patient.  > 50% of time was spent in direct patient care.     For questions or updates, please contact [facility] consult [email or web address] for contact info under        Signed, Makiyah Zentz Fransico Him9/12/2020, 10:02 AM

## 2020-10-04 NOTE — Interval H&P Note (Signed)
History and Physical Interval Note:  10/04/2020 11:39 AM  Jennifer Hogan  has presented today for surgery, with the diagnosis of unstable angina.  The various methods of treatment have been discussed with the patient and family. After consideration of risks, benefits and other options for treatment, the patient has consented to  Procedure(s): LEFT HEART CATH AND CORS/GRAFTS ANGIOGRAPHY (N/A) as a surgical intervention.  The patient's history has been reviewed, patient examined, no change in status, stable for surgery.  I have reviewed the patient's chart and labs.  Questions were answered to the patient's satisfaction.     Sherren Mocha

## 2020-10-04 NOTE — Progress Notes (Signed)
NAME:  Jennifer Hogan, MRN:  [id number], DOB:  08-23-1970, LOS: 4 ADMISSION DATE:  09/30/2020, CONSULTATION DATE: 10/02/2020 REFERRING MD:  Albertine Patricia, MD , CHIEF COMPLAINT: Shortness of breath  History of Present Illness:  50 year old female with type 1 diabetes on insulin pump, coronary artery disease status post stents and CABG who was admitted initially with diabetic ketoacidosis due to malfunctioning insulin pump, she was started on DKA protocol with IV fluid and insulin infusion.  During her stay she was noted to have elevated serum troponin with EKG changes, cardiology was consulted, diagnosed with acute non-ST elevation MI, was started on IV heparin infusion.  This morning patient was noted to be short of breath and hypoxic with O2 sat dropped down to 70s, she was put on nonrebreather mask, rapid response was called and CCM was consulted for evaluation.  During my evaluation patient is on BiPAP, tachypneic O2 sat is at 97% on 60% FiO2.  She did complain of shortness of breath but denies chest pain, palpitation, headache, fever, chills or other complaints  Pertinent  Medical History   Past Medical History:  Diagnosis Date   Anemia    Anxiety    Coronary artery disease    a. s/p PTCA DES x3 in the LAD (ostial DES placed, mid DES placed, distal DES placed) and PTCA/DES x1 to intermediate branch 02/2017. b. ACS 02/08/18 with LCx stent placement.   Depression    Diabetes mellitus    type 1, on insulin pump   DKA (diabetic ketoacidoses) 12/31/2017   Elevated platelet count    High cholesterol    Hypothyroidism    Ischemic cardiomyopathy    a. EF low-normal with basal inferior akinesis, basal septal hypokinesis by echo 01/2018.     Significant Hospital Events: Including procedures, antibiotic start and stop dates in addition to other pertinent events   9/9 admitted with DKA 9/10 consulted and was transferred to ICU for respiratory distress ECHO: Global hypokinesis with  akinesis of all apical segments. Left ventricular ejection fraction, by estimation, is 20 to 25%. The left  ventricle has severely decreased function. The left ventricle demonstrates global hypokinesis. Left ventricular diastolic parameters are consistent with Grade I diastolic dysfunction (impaired relaxation). Elevated left ventricular end-diastolic pressure.  2. Right ventricular systolic function is normal 9/11 no longer in resp distress.  9/12 room air   Interim History / Subjective:  No distress.  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 rate 16, height '5\' 5"'$  (1.651 m), weight 62.5 kg, SpO2 97 %.        Intake/Output Summary (Last 24 hours) at 10/04/2020 0821 Last data filed at 10/04/2020 0300 Gross per 24 hour  Intake 1093.69 ml  Output no documentation  Net 1093.69 ml   Filed Weights   09/30/20 1646 10/01/20 1530 10/03/20 0400  Weight: 65.8 kg 62.2 kg 62.5 kg    Examination: General 50 year old female sitting up right in bed. No distress HENT NCAT no JVD MMM Pulm clear. No accessory use. Room air Card RRR  Abd soft not tender  Ext warm. No edema  Neuro intact  Resolved Hospital Problem list   Sepsis/pneumonia was ruled out Diabetic ketoacidosis, resolved.  Resolved AKI Assessment & Plan:  Acute hypoxic respiratory failure due to flash pulmonary edema from Acute HfrEF due to ACS Coronary  artery disease status post CABG, now with acute non-ST elevation MI Hypertension Now on room air  Plan Continuing IV heparin for ACS Continue aspirin and Plavix Low-dose statin given poor tolerance in the past Low-dose Lopressor Spironolactone Daily assessment for diuresis Optimize electrolytes As needed nitroglycerin and morphine for chest pain Left heart cath planned for today  Strep bacteremia-? 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 DVT prophylaxis: systemic heparin GI prophylaxis: PPI Lines: N/A Foley:  N/A Code Status:  full code Last date of multidisciplinary goals of care discussion '[ ]'$   Can move out if ICU after LHC if no issues  Erick Colace ACNP-BC [place] Pager # [phone] OR # [phone] if no answer

## 2020-10-04 NOTE — Plan of Care (Signed)

## 2020-10-05 ENCOUNTER — Other Ambulatory Visit (HOSPITAL_COMMUNITY): Payer: Self-pay

## 2020-10-05 ENCOUNTER — Other Ambulatory Visit: Payer: Self-pay

## 2020-10-05 ENCOUNTER — Encounter (HOSPITAL_COMMUNITY): Payer: Self-pay | Admitting: Internal Medicine

## 2020-10-05 ENCOUNTER — Other Ambulatory Visit: Payer: Self-pay | Admitting: Endocrinology

## 2020-10-05 DIAGNOSIS — I5181 Takotsubo syndrome: Secondary | ICD-10-CM

## 2020-10-05 DIAGNOSIS — A419 Sepsis, unspecified organism: Secondary | ICD-10-CM

## 2020-10-05 DIAGNOSIS — E1010 Type 1 diabetes mellitus with ketoacidosis without coma: Secondary | ICD-10-CM

## 2020-10-05 DIAGNOSIS — I420 Dilated cardiomyopathy: Secondary | ICD-10-CM

## 2020-10-05 DIAGNOSIS — I5021 Acute systolic (congestive) heart failure: Secondary | ICD-10-CM

## 2020-10-05 DIAGNOSIS — I214 Non-ST elevation (NSTEMI) myocardial infarction: Secondary | ICD-10-CM

## 2020-10-05 DIAGNOSIS — E119 Type 2 diabetes mellitus without complications: Secondary | ICD-10-CM

## 2020-10-05 LAB — BASIC METABOLIC PANEL
Anion gap: 7 (ref 5–15)
Anion gap: 9 (ref 5–15)
BUN: 11 mg/dL (ref 6–20)
BUN: 10 mg/dL (ref 6–20)
CO2: 28 mmol/L (ref 22–32)
CO2: 25 mmol/L (ref 22–32)
Calcium: 8.9 mg/dL (ref 8.9–10.3)
Calcium: 8.5 mg/dL — ABNORMAL LOW (ref 8.9–10.3)
Chloride: 102 mmol/L (ref 98–111)
Chloride: 99 mmol/L (ref 98–111)
Creatinine, Ser: 0.91 mg/dL (ref 0.44–1.00)
Creatinine, Ser: 0.94 mg/dL (ref 0.44–1.00)
GFR, Estimated: >60 mL/min (ref >60)
GFR, Estimated: >60 mL/min (ref >60)
Glucose, Bld: 120 mg/dL — ABNORMAL HIGH (ref 70–99)
Glucose, Bld: 334 mg/dL — ABNORMAL HIGH (ref 70–99)
Potassium: 5.1 mmol/L (ref 3.5–5.1)
Potassium: 4.4 mmol/L (ref 3.5–5.1)
Sodium: 137 mmol/L (ref 135–145)
Sodium: 133 mmol/L — ABNORMAL LOW (ref 135–145)

## 2020-10-05 LAB — CBC
HCT: 35.9 % — ABNORMAL LOW (ref 36.0–46.0)
Hemoglobin: 12.1 g/dL (ref 12.0–15.0)
MCH: 32.2 pg (ref 26.0–34.0)
MCHC: 33.7 g/dL (ref 30.0–36.0)
MCV: 95.5 fL (ref 80.0–100.0)
Platelets: 375 10*3/uL (ref 150–400)
RBC: 3.76 MIL/uL — ABNORMAL LOW (ref 3.87–5.11)
RDW: 13.8 % (ref 11.5–15.5)
WBC: 6.6 10*3/uL (ref 4.0–10.5)
nRBC: 0 % (ref 0.0–0.2)

## 2020-10-05 LAB — CULTURE, BLOOD (ROUTINE X 2): Culture: NO GROWTH

## 2020-10-05 LAB — GLUCOSE, CAPILLARY
Glucose-Capillary: 164 mg/dL — ABNORMAL HIGH (ref 70–99)
Glucose-Capillary: 211 mg/dL — ABNORMAL HIGH (ref 70–99)
Glucose-Capillary: 249 mg/dL — ABNORMAL HIGH (ref 70–99)
Glucose-Capillary: 275 mg/dL — ABNORMAL HIGH (ref 70–99)
Glucose-Capillary: 156 mg/dL — ABNORMAL HIGH (ref 70–99)
Glucose-Capillary: 236 mg/dL — ABNORMAL HIGH (ref 70–99)
Glucose-Capillary: 42 mg/dL — CL (ref 70–99)
Glucose-Capillary: 55 mg/dL — ABNORMAL LOW (ref 70–99)
Glucose-Capillary: 76 mg/dL (ref 70–99)
Glucose-Capillary: 171 mg/dL — ABNORMAL HIGH (ref 70–99)

## 2020-10-05 MED ORDER — METOPROLOL SUCCINATE ER 25 MG PO TB24
12.5000 mg | ORAL_TABLET | Freq: Every day | ORAL | Status: DC
  Administered 2020-10-05 – 2020-10-06 (×2): 12.5 mg via ORAL
  Filled 2020-10-05 (×2): qty 1

## 2020-10-05 MED ORDER — INSULIN PUMP
Freq: Three times a day (TID) | SUBCUTANEOUS | Status: DC
  Administered 2020-10-05: 2 via SUBCUTANEOUS
  Filled 2020-10-05: qty 1

## 2020-10-05 MED ORDER — INSULIN ASPART 100 UNIT/ML IJ SOLN
4.0000 [IU] | Freq: Three times a day (TID) | INTRAMUSCULAR | Status: DC
  Administered 2020-10-05 (×2): 4 [IU] via SUBCUTANEOUS

## 2020-10-05 MED ORDER — INSULIN ASPART 100 UNIT/ML IJ SOLN: 0.0000 [IU] | Freq: Every day | INTRAMUSCULAR | Status: DC

## 2020-10-05 MED ORDER — LOSARTAN POTASSIUM 25 MG PO TABS: 25.0000 mg | ORAL_TABLET | Freq: Every day | ORAL | Status: DC

## 2020-10-05 MED ORDER — LOSARTAN POTASSIUM 25 MG PO TABS
12.5000 mg | ORAL_TABLET | Freq: Every day | ORAL | Status: DC
  Administered 2020-10-05 – 2020-10-06 (×2): 12.5 mg via ORAL
  Filled 2020-10-05 (×2): qty 1

## 2020-10-05 MED ORDER — INSULIN ASPART 100 UNIT/ML IJ SOLN: 0.0000 [IU] | Freq: Three times a day (TID) | INTRAMUSCULAR | Status: DC

## 2020-10-05 MED ORDER — INSULIN ASPART 100 UNIT/ML IJ SOLN
0.0000 [IU] | Freq: Three times a day (TID) | INTRAMUSCULAR | Status: DC
  Administered 2020-10-05 (×2): 5 [IU] via SUBCUTANEOUS

## 2020-10-05 MED ORDER — INSULIN DETEMIR 100 UNIT/ML ~~LOC~~ SOLN
12.0000 [IU] | Freq: Two times a day (BID) | SUBCUTANEOUS | Status: DC
  Administered 2020-10-05: 12 [IU] via SUBCUTANEOUS
  Filled 2020-10-05 (×2): qty 0.12

## 2020-10-05 MED ORDER — ENOXAPARIN SODIUM 40 MG/0.4ML IJ SOSY
40.0000 mg | PREFILLED_SYRINGE | INTRAMUSCULAR | Status: DC
  Administered 2020-10-05: 40 mg via SUBCUTANEOUS
  Filled 2020-10-05: qty 0.4

## 2020-10-05 MED ORDER — INSULIN ASPART 100 UNIT/ML IJ SOLN: 4.0000 [IU] | Freq: Three times a day (TID) | INTRAMUSCULAR | Status: DC

## 2020-10-05 NOTE — Telephone Encounter (Signed)
Pt is Inpatient and is going to be discharged tomorrow. Does need insulin after discharge, has none at home. Please call pt [phone]

## 2020-10-05 NOTE — Plan of Care (Signed)

## 2020-10-05 NOTE — Progress Notes (Signed)
Hypoglycemic Event  CBG: 42  Treatment: 8 oz juice/soda/ graham crackers  Symptoms: Sweaty and Shaky  Follow-up CBG: Time:1500 CBG Result:55  Possible Reasons for Event: Increase in insulin coverage; due to change to insulin pump at [facility]

## 2020-10-05 NOTE — Progress Notes (Addendum)
Progress Note  Patient Name: Jennifer Hogan Date of Encounter: 10/05/2020  [facility] Cardiologist: Fransico Him, MD   Subjective   Noted to have elevated sed rate last night with pleuritic CP and small PE on echo.  LHC yesterday showed severe native vessel CAD with total occlusion of the proximal LAD, moderate IM stenosis and severe ostial LCx in-stent restenosis with patency of the RIMA>LCx and SVG sequential to IM and LAD.  LVEDP was normal and medical management recommended.  NO further CP.   Inpatient Medications    Scheduled Meds:  aspirin EC  81 mg Oral Daily   atorvastatin  20 mg Oral Daily   budesonide  0.25 mg Nebulization BID   Chlorhexidine Gluconate Cloth  6 each Topical Daily   clopidogrel  75 mg Oral Daily   FLUoxetine  40 mg Oral Daily   insulin aspart  0-15 Units Subcutaneous TID WC   insulin aspart  0-5 Units Subcutaneous QHS   insulin aspart  4 Units Subcutaneous TID WC   insulin detemir  12 Units Subcutaneous Q12H   levothyroxine  100 mcg Oral Q0600   metoprolol tartrate  12.5 mg Oral BID   pantoprazole  40 mg Oral Daily   sodium chloride flush  3 mL Intravenous Q12H   sodium chloride flush  3 mL Intravenous Q12H   spironolactone  12.5 mg Oral Daily   sucralfate  1 g Oral TID WC & HS   Continuous Infusions:  sodium chloride     cefTRIAXone (ROCEPHIN)  IV Stopped (10/04/20 0839)   sodium chloride     PRN Meds: sodium chloride, acetaminophen, albuterol, dextrose, dextrose, hydrOXYzine, labetalol, metoCLOPramide (REGLAN) injection, nitroGLYCERIN, ondansetron (ZOFRAN) IV, sodium chloride flush   Vital Signs    Vitals:   10/05/20 0500 10/05/20 0600 10/05/20 0800 10/05/20 0812  BP:    127/78  Pulse: 77 81  97  Resp: (!) 24 16  (!) 22  Temp:   98.2 F (36.8 C)   TempSrc:   Oral   SpO2: 96% 97%  100%  Weight:      Height:        Intake/Output Summary (Last 24 hours) at 10/05/2020 0837 Last data filed at 10/04/2020 2200 Gross per 24 hour   Intake 911 ml  Output --  Net 911 ml    Last 3 Weights 10/04/2020 10/03/2020 10/01/2020  Weight (lbs) 134 lb 7.7 oz 137 lb 12.6 oz 137 lb 2 oz  Weight (kg) 61 kg 62.5 kg 62.2 kg      Telemetry    NSR- Personally Reviewed   Physical Exam  GEN: Well nourished, well developed in no acute distress HEENT: Normal NECK: No JVD; No carotid bruits LYMPHATICS: No lymphadenopathy CARDIAC:RRR, no murmurs, rubs, gallops RESPIRATORY:  Clear to auscultation without rales, wheezing or rhonchi  ABDOMEN: Soft, non-tender, non-distended MUSCULOSKELETAL:  No edema; No deformity  SKIN: Warm and dry NEUROLOGIC:  Alert and oriented x 3 PSYCHIATRIC:  Normal affect   Labs    High Sensitivity Troponin:   Recent Labs  Lab 09/30/20 1658 09/30/20 1853 10/01/20 1553  TROPONINIHS 61* 54* >24,000*       Chemistry Recent Labs  Lab 10/03/20 1448 10/04/20 0009 10/05/20 0037  NA 135 134* 137  K 3.4* 3.5 5.1  CL 98 99 102  CO2 _0 GLUCOSE 143* 161* 120*  BUN _1 CREATININE 0.80 0.96 0.91  CALCIUM 8.6* 8.6* 8.9  GFRNONAA >60 >60 >60  ANIONGAP _0 Hematology Recent Labs  Lab 10/03/20 0652 10/04/20 0009 10/05/20 0037  WBC 12.2* 8.3 6.6  RBC 4.35 4.04 3.76*  HGB 14.0 12.8 12.1  HCT 39.9 37.7 35.9*  MCV 91.7 93.3 95.5  MCH 32.2 31.7 32.2  MCHC 35.1 34.0 33.7  RDW 13.5 13.4 13.8  PLT 480* 470* 375     Radiology    CARDIAC CATHETERIZATION  Addendum Date: 10/04/2020     Mid LAD lesion is 60% stenosed.   Ost Cx to Prox Cx lesion is 99% stenosed.   Prox Cx to Mid Cx lesion is 70% stenosed.   Ramus-1 lesion is 60% stenosed.   Ramus-2 lesion is 20% stenosed.   Ramus-3 lesion is 70% stenosed.   Prox LAD to Mid LAD lesion is 100% stenosed.   Dist LAD-1 lesion is 70% stenosed.   Dist LAD-2 lesion is 70% stenosed.   Non-stenotic Mid LAD to Dist LAD lesion was previously treated.   RIMA graft was visualized by angiography and is small.   LIMA and is large.   The graft  exhibits no disease.   LV end diastolic pressure is normal. Severe native vessel CAD with total occlusion of the proximal LAD, moderate stenosis of the intermediate branch, and severe ostial circumflex in-stent restenosis S/P CABG with patency of the both the RIMA-circumflex and LAD sequential to the intermediate and LAD Normal LVEDP Recommend: continued medical therapy  Result Date: 10/04/2020   Prox LAD to Mid LAD lesion is 10% stenosed.   Dist LAD-1 lesion is 99% stenosed.   Dist LAD-2 lesion is 99% stenosed.   Mid LAD lesion is 60% stenosed.   Ost Cx to Prox Cx lesion is 99% stenosed.   Prox Cx to Mid Cx lesion is 70% stenosed.   Ramus-1 lesion is 60% stenosed.   Ramus-2 lesion is 20% stenosed.   Ramus-3 lesion is 70% stenosed.   Non-stenotic Mid LAD to Dist LAD lesion was previously treated.   RIMA graft was visualized by angiography and is small.   LIMA and is large.   The graft exhibits no disease.   LV end diastolic pressure is normal. Severe native vessel CAD with total occlusion of the proximal LAD, moderate stenosis of the intermediate branch, and severe ostial circumflex in-stent restenosis S/P CABG with patency of the both the RIMA-circumflex and LAD sequential to the intermediate and LAD Normal LVEDP Recommend: continued medical therapy     Patient Profile     50 year old female with past medical history of coronary artery disease status post coronary artery bypass and graft (LIMA to the LAD/ramus intermedius and RIMA to the obtuse marginal), diabetes mellitus, hyperlipidemia admitted with DKA being evaluated for non-ST elevation myocardial infarction.  Patient developed severe respiratory distress on September 10 due to pulmonary edema and was transferred to CCU.  Echocardiogram shows newly reduced LV function with ejection fraction 20 to 62%, grade 1 diastolic dysfunction, small pericardial effusion.  Assessment & Plan    Non-ST elevation myocardial infarction -has had intermittent CP for  over 2 weeks -echo this admit with new LV dysfunction -pleuritic CP isolated to 9/11 with elevated ESR ? Acute pericarditis but pain is different than her typical angina she has had over the past 2 weeks -hsTrop peaked at >24,000 -ESR is very nonspecific and since she has not had any further CP at this time will hold off for now with Colchicine -cath showed severe native vessel CAD with total  occlusion of the proximal LAD, moderate IM stenosis and severe ostial LCx in-stent restenosis with patency of the RIMA>LCx and SVG sequential to IM and LAD.  LVEDP was normal and medical management recommended.   -suspect she had stress MI -she has not had any further CP -continue aspirin, Plavix -consolidate Toprol XL to 12.11m daily -Patient is intolerant to statins in the past but started on Atorvastatin 274mthis admit -will get into lipid clinic outpt  Acute systolic congestive heart failure -LVEDP normal at cath -new LV dysfunction on echo with EF 20-25% ? Stress CM -Consolidate Lopressor to Toprol Xl 12.71m6maily -add Losartan 12.71mg82mily as BP allows -will try to titrate HF meds but Bp on soft side -will need repeat echo in 2 months to see if LVF has normalized  Ischemic cardiomyopathy- -Blood pressure stable this am -see above>>adding low dose Losartan 12.71mg 80mly  -consolidate lopressor to Toprol XL 12.71mg d571my -continue spiro 12.71mg da271m -BMET in am  Acute kidney injury -in the setting of DKA.   -SCr stable at 0.91 and K+ 5.1 -repeat BMET in am  DKA -management per primary service.  1 of 2 blood cultures positive for strep -antibiotics per primary care.  Hyperlipidemia -patient apparently has an intolerance to statins and Zetia but currently is tolerating low dose Atorvastatin -Following discharge will need to follow-up in the lipid clinic for consideration of Repatha or Praluent as I do not think her LDL, which is 166, will get to goal on low dose statin  I have spent a  total of 35 minutes with patient reviewing 2D echo, cardiac cath, hospital notes , telemetry, EKGs, labs and examining patient as well as establishing an assessment and plan that was discussed with the patient.  > 50% of time was spent in direct patient care.     For questions or updates, please contact [facility] consult [email or web address] for contact info under        Signed, Zahi Plaskett TFransico Him/13/2022, 8:38 AM

## 2020-10-05 NOTE — Progress Notes (Signed)
Inpatient Diabetes Program Recommendations  AACE/ADA: New Consensus Statement on Inpatient Glycemic Control   Target Ranges:  Prepandial:   less than 140 mg/dL      Peak postprandial:   less than 180 mg/dL (1-2 hours)      Critically ill patients:  140 - 180 mg/dL  Results for Jennifer Hogan, Jennifer Hogan (MRN [id number]) as of 10/05/2020 15:53  Ref. Range 10/05/2020 06:38 10/05/2020 08:02 10/05/2020 09:51 10/05/2020 11:22 10/05/2020 12:46 10/05/2020 14:47 10/05/2020 15:07 10/05/2020 15:46  Glucose-Capillary Latest Ref Range: 70 - 99 mg/dL 211 (H) 249 (H)  Novolog 9 units 275 (H)    Levemir 12 units 156 (H) 236 (H)  Novolog 9 units 42 (LL) 55 (L) 76   Results for Jennifer Hogan, Jennifer Hogan (MRN [id number]) as of 10/05/2020 08:02   Ref. Range 10/04/2020 07:41 10/04/20 9:24 10/04/2020 10:52 09/23/2020 12:45 10/04/2020 15:29 10/04/2020 20:26 10/04/2020 23:42  Glucose-Capillary Latest Ref Range: 70 - 99 mg/dL 123          Levemir 7 units 187      Novolog 4 units         205   Novolog 6 units 74       Levemir 7 units   83     Review of Glycemic Control  Diabetes history: DM type 1 Outpatient Diabetes medications: insulin pump with Humalog basal rate of 0.7 units/hr, 6 AM-10 AM, 0.9 units/hr, 10 AM-10 PM, and 0.625 units/hr overnight (when not in "auto mode"); Total of 16.8 units of basal insulin in a 24 hour period; bolus of 1 unit/16 grams CHO; correction bolus of 1 unit for each 75 mg/dl above 100 mg/dl Current orders for Inpatient glycemic control: Insulin Pump ACHS&2am    NOTE: Levemir was increased today and patient experienced hypoglycemia this afternoon. Patient has Type 1 DM and is very sensitive to insulin as 1 unit of insulin drops glucose 75 mg/dl and 1 unit covers 16 grams of carb (per insulin pump settings in chart).  Received page from [employer], [place] regarding transitioning patient back to insulin pump. Insulin pump order set has been ordered and patient would prefer to put her insulin pump back on  due to erratic glucose on SQ insulin regimen ordered. Discussed that patient received Levemir 12 units at 10:39 am today which could last up to 24 hours. If insulin pump is restarted now and basal rates are delivered then patient may continue to be hypoglycemic. Patient informed Meriel Pica, RN that she could suspend her basal rates and would only use her pump for boluses (correction and meal coverage). Spoke with patient over the phone and she would like to restart her insulin pump because her glucose has been so poorly managed on ordered SQ regimen and she is upset about how her DM has been managed since admitted (frustrated especially about management today).  Patient states that she was given too much insulin for correction which has caused the hypoglycemia noted this afternoon.  Meriel Pica, RN has discussed insulin regimen and restarting insulin pump with provider and patient. Patient has all insulin pump supplies to get pump restarted but she will need a bottle of Novolog. Asked her to let Meriel Pica, RN know that she needs Novolog vial and RN can obtain from pharmacy. Patient states that she will suspend her basal rates and she will only be using her insulin pump for boluses for now. She plans to restart her basal rates in the morning. Spoke with Meriel Pica, RN to ask that she request pharmacy send  a vial of Novolog for patient supply to use to fill up reservoir in insulin pump. Will plan to follow up on patient in the morning.  Thanks, Barnie Alderman, RN, MSN, CDE Diabetes Coordinator Inpatient Diabetes Program [phone] (Team Pager from 8am to 5pm)

## 2020-10-05 NOTE — Progress Notes (Signed)
Inpatient Diabetes Program Recommendations  AACE/ADA: New Consensus Statement on Inpatient Glycemic Control   Target Ranges:  Prepandial:   less than 140 mg/dL      Peak postprandial:   less than 180 mg/dL (1-2 hours)      Critically ill patients:  140 - 180 mg/dL    Results for TAHRA, HOVE (MRN [id number]) as of 10/05/2020 08:02  Ref. Range 10/04/2020 07:41 10/04/20 9:24 10/04/2020 10:52 09/23/2020 12:45 10/04/2020 15:29 10/04/2020 20:26 10/04/2020 23:42 10/05/2020 06:38  Glucose-Capillary Latest Ref Range: 70 - 99 mg/dL 123      Levemir 7 units 187    Novolog 4 units     205  Novolog 6 units 74    Levemir 7 units  83 211 (H)   Review of Glycemic Control  Diabetes history: DM type 1 Outpatient Diabetes medications: insulin pump with Humalog basal rate of 0.7 units/hr, 6 AM-10 AM, 0.9 units/hr, 10 AM-10 PM, and 0.625 units/hr overnight (when not in "auto mode"); Total of 16.8 units of basal insulin in a 24 hour period; bolus of 1 unit/16 grams CHO; correction bolus of 1 unit for each 75 mg/dl above 100 mg/dl Current orders for Inpatient glycemic control: Levemir 12 units BID, Novolog 4 units TID with meals, Novolog 0-15 units TID with meals, Novolog 0-5 units QH  Inpatient Diabetes Program Recommendations:    Insulin: Noted Levemir increased from 7 units BID to 12 units BID today. Would recommend decreasing Levemir down to 8 units BID and decreasing Novolog correction to 0-9 units TID with meals.  Thanks, Barnie Alderman, RN, MSN, CDE Diabetes Coordinator Inpatient Diabetes Program [phone] (Team Pager from 8am to 5pm)

## 2020-10-05 NOTE — Care Management (Signed)
1111 10-05-20 Case Manager scheduled hospital f/u appointment at the [facility] and [facility].  Appointment information placed on the AVS. Case Manager will follow for MATCH needs.

## 2020-10-05 NOTE — Progress Notes (Signed)
[facility] PROGRESS NOTE    Progress Note  [name]  [id number] DOB: Nov 13, 1970 DOA: 09/30/2020 PCP: [name], NP     Brief Narrative:   Jennifer Hogan is an 50 y.o. female past medical history of type 1 diabetes mellitus, coronary artery disease with a history of CABG and stent, chronic diastolic heart failure with a last echo in November 2020 that showed an EF of 55% was initially admitted for DKA due to insulin pump malfunction during her hospital stay she was found to have elevated serum troponins cardiology was consulted and was diagnosed with a non-ST elevation MI started on IV heparin infusion, became hypoxic dropped her sats into the 70s PCCM was called placed on BiPAP 97% on an FiO2 of 60% on BiPAP  Procedures: 10/03/2018 two 2D echo was done that showed an EF of 20%  Cultures: 09/30/2020 blood cultures show strep sanguineous. Repeated blood cultures on 10/03/2020 remain negative till date  Assessment/Plan:   Acute respiratory failure with hypoxia due to flash pulmonary edema in the setting of acute diastolic heart failure possibly acute coronary syndrome: Was started on IV heparin, aspirin, Plavix and metoprolol. She was also continued on her Aldactone, morphine for pain and IV nitroglycerin for chest pain. Cardiology was consulted recommended a left heart cath on 10/04/2020 showed severe native CAD with total occlusion of the proximal LAD, moderate stenosis of the intermediate branch and severe ostial circumflex stent restenosis. Cardiology recommended medical management. She was started on low-dose statin at Lipitor 20.  1 out of 2 blood cultures was positive for strep bacteremia: Contamination was started on IV Rocephin today is day 5 surveillance blood cultures on 10/03/2020 have remained negative till date question contaminant.  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 Cardiology on board and relates will hold on on colchicine.  Diabetes mellitus type 1/DKA: Treated with IV insulin fluids now on sliding scale long-acting insulin. Is rising and currently not n.p.o. anymore we will increase long-acting insulin.  HypoThyroidism: Continue Synthroid.  Acute kidney injury: Likely prerenal azotemia in the setting of DKA now resolved.  DVT prophylaxis: lovenox Family Communication:none Status is: Inpatient  Remains inpatient appropriate because:Hemodynamically unstable  Dispo: The patient is from: Home              Anticipated d/c is to: Home              Patient currently is not medically stable to d/c.   Difficult to place patient No   Code Status:     Code Status Orders  (From admission, onward)           Start     Ordered   09/30/20 2251  Full code  Continuous        09/30/20 2252           Code Status History     Date Active Date Inactive Code Status Order ID Comments User Context   05/01/2019 2134 05/02/2019 2257 Full Code [id number]  Shela Leff, MD ED   03/15/2019 1453 03/16/2019 1809 DNR [id number]  Lars Mage, MD ED   03/15/2019 1441 03/15/2019 1453 Full Code [id number]  Lars Mage, MD ED   11/28/2018 2206 12/02/2018 1728 Full Code [id number]  Shela Leff, MD ED   11/24/2018 0605 11/26/2018 1621 Full Code [id number]  Elwyn Reach, MD ED   11/02/2018 0812 11/09/2018 1916 Full Code  [id number]  Reola Mosher Inpatient   10/31/2018 1458 11/02/2018 0811 Full Code [id number]  Burnell Blanks, MD Inpatient   05/24/2018 1950 05/27/2018 1512 Full Code [id number]  Etta Quill, DO ED   02/15/2018 0020 02/16/2018 1740 Full Code [id number]  Lenore Cordia, MD ED   02/08/2018 0926 02/11/2018 1706 Full Code [id number]  Karmen Bongo, MD Inpatient   02/08/2018 0821 02/08/2018 0926 Full Code [id number]  Belva Crome, MD Inpatient   12/31/2017 1329 01/02/2018 1444 Full Code [id number]  Karmen Bongo, MD ED   12/06/2017 1717 12/07/2017 1814 Full Code [id number]  Guilford Shi, MD ED   03/17/2017 2023 03/22/2017 1346 Full Code [id number]  Jani Gravel, MD ED   03/16/2017 0001 03/17/2017 0045 Full Code [id number]  Rise Patience, MD Inpatient         IV Access:   Peripheral IV   Procedures and diagnostic studies:   CARDIAC CATHETERIZATION  Addendum Date: 10/04/2020     Mid LAD lesion is 60% stenosed.   Ost Cx to Prox Cx lesion is 99% stenosed.   Prox Cx to Mid Cx lesion is 70% stenosed.   Ramus-1 lesion is 60% stenosed.   Ramus-2 lesion is 20% stenosed.   Ramus-3 lesion is 70% stenosed.   Prox LAD to Mid LAD lesion is 100% stenosed.   Dist LAD-1 lesion is 70% stenosed.   Dist LAD-2 lesion is 70% stenosed.   Non-stenotic Mid LAD to Dist LAD lesion was previously treated.   RIMA graft was visualized by angiography and is small.   LIMA and is large.   The graft exhibits no disease.   LV end diastolic pressure is normal. Severe native vessel CAD with total occlusion of the proximal LAD, moderate stenosis of the intermediate branch, and severe ostial circumflex in-stent restenosis S/P CABG with patency of the both the RIMA-circumflex and LAD sequential to the intermediate and LAD Normal LVEDP Recommend: continued medical therapy  Result Date: 10/04/2020   Prox LAD to Mid LAD lesion is 10% stenosed.   Dist LAD-1 lesion is 99% stenosed.   Dist LAD-2 lesion is 99% stenosed.   Mid LAD lesion is 60% stenosed.   Ost Cx to Prox Cx lesion is 99% stenosed.   Prox Cx to Mid Cx lesion is 70% stenosed.   Ramus-1 lesion is 60% stenosed.   Ramus-2 lesion is 20% stenosed.   Ramus-3 lesion is 70% stenosed.   Non-stenotic Mid LAD to Dist LAD lesion was previously treated.   RIMA graft was visualized by angiography and is small.   LIMA and is large.   The graft exhibits no disease.   LV end diastolic pressure is normal. Severe native vessel CAD with total occlusion of the proximal LAD, moderate stenosis of  the intermediate branch, and severe ostial circumflex in-stent restenosis S/P CABG with patency of the both the RIMA-circumflex and LAD sequential to the intermediate and LAD Normal LVEDP Recommend: continued medical therapy     Medical Consultants:   None.   Subjective:    Nicolasa A Fisher she relates she feels great she is tolerating her diet.  Objective:    Vitals:   10/05/20 0300 10/05/20 0400 10/05/20 0500 10/05/20 0600  BP:  104/75    Pulse: 78 79 77 81  Resp: (!) 21 20 (!) 24 16  Temp:      TempSrc:      SpO2: 98% 100% 96% 97%  Weight:      Height:  SpO2: 97 % O2 Flow Rate (L/min): 2 L/min FiO2 (%): 28 %   Intake/Output Summary (Last 24 hours) at 10/05/2020 0722 Last data filed at 10/04/2020 2200 Gross per 24 hour  Intake 1085.96 ml  Output --  Net 1085.96 ml   Filed Weights   10/01/20 1530 10/03/20 0400 10/04/20 0929  Weight: 62.2 kg 62.5 kg 61 kg    Exam: General exam: In no acute distress. Respiratory system: Good air movement and clear to auscultation. Cardiovascular system: S1 & S2 heard, RRR. No JVD. Gastrointestinal system: Abdomen is nondistended, soft and nontender.   Extremities: No pedal edema. Skin: No rashes, lesions or ulcers Psychiatry: Judgement and insight appear normal. Mood & affect appropriate.    Data Reviewed:    Labs: Basic Metabolic Panel: Recent Labs  Lab 10/03/20 0652 10/03/20 1214 10/03/20 1448 10/04/20 0009 10/05/20 0037  NA 137 135 135 134* 137  K 2.9* 3.5 3.4* 3.5 5.1  CL 100 98 98 99 102  CO2 '26 22 25 23 28  '$ GLUCOSE 134* 133* 143* 161* 120*  BUN '13 13 11 8 11  '$ CREATININE 0.93 0.84 0.80 0.96 0.91  CALCIUM 8.9 8.2* 8.6* 8.6* 8.9  MG  --   --   --  1.9  --    GFR Estimated Creatinine Clearance: 65.8 mL/min (by C-G formula based on SCr of 0.91 mg/dL). Liver Function Tests: No results for input(s): AST, ALT, ALKPHOS, BILITOT, PROT, ALBUMIN in the last 168 hours. Recent Labs  Lab 09/30/20 1658   LIPASE 19   No results for input(s): AMMONIA in the last 168 hours. Coagulation profile No results for input(s): INR, PROTIME in the last 168 hours. COVID-19 Labs  Recent Labs    10/03/20 1448  CRP 6.2*    Lab Results  Component Value Date   SARSCOV2NAA NEGATIVE 09/30/2020   SARSCOV2NAA NEGATIVE 03/03/2020   SARSCOV2NAA NEGATIVE 07/14/2019   Edie NEGATIVE 07/13/2019    CBC: Recent Labs  Lab 09/30/20 1658 09/30/20 1947 09/30/20 1948 10/01/20 0057 10/03/20 0652 10/04/20 0009 10/05/20 0037  WBC 20.1*  --   --  24.9* 12.2* 8.3 6.6  HGB 12.4   < > 13.3 11.3* 14.0 12.8 12.1  HCT 37.6   < > 39.0 33.6* 39.9 37.7 35.9*  MCV 95.4  --   --  95.5 91.7 93.3 95.5  PLT 455*  --   --  406* 480* 470* 375   < > = values in this interval not displayed.   Cardiac Enzymes: No results for input(s): CKTOTAL, CKMB, CKMBINDEX, TROPONINI in the last 168 hours. BNP (last 3 results) No results for input(s): PROBNP in the last 8760 hours. CBG: Recent Labs  Lab 10/04/20 1052 10/04/20 1529 10/04/20 2026 10/04/20 2342 10/05/20 0638  GLUCAP 187* 205* 74 83 211*   D-Dimer: No results for input(s): DDIMER in the last 72 hours. Hgb A1c: No results for input(s): HGBA1C in the last 72 hours. Lipid Profile: No results for input(s): CHOL, HDL, LDLCALC, TRIG, CHOLHDL, LDLDIRECT in the last 72 hours. Thyroid function studies: No results for input(s): TSH, T4TOTAL, T3FREE, THYROIDAB in the last 72 hours.  Invalid input(s): FREET3 Anemia work up: No results for input(s): VITAMINB12, FOLATE, FERRITIN, TIBC, IRON, RETICCTPCT in the last 72 hours. Sepsis Labs: Recent Labs  Lab 09/30/20 2000 10/01/20 0054 10/01/20 0057 10/03/20 0652 10/04/20 0009 10/05/20 0037  WBC  --   --  24.9* 12.2* 8.3 6.6  LATICACIDVEN 6.3* 2.2*  --   --   --   --  Microbiology Recent Results (from the past 240 hour(s))  Resp Panel by RT-PCR (Flu A&B, Covid) Nasopharyngeal Swab     Status: None    Collection Time: 09/30/20  6:55 PM   Specimen: Nasopharyngeal Swab; Nasopharyngeal(NP) swabs in vial transport medium  Result Value Ref Range Status   SARS Coronavirus 2 by RT PCR NEGATIVE NEGATIVE Final    Comment: (NOTE) SARS-CoV-2 target nucleic acids are NOT DETECTED.  The SARS-CoV-2 RNA is generally detectable in upper respiratory specimens during the acute phase of infection. The lowest concentration of SARS-CoV-2 viral copies this assay can detect is 138 copies/mL. A negative result does not preclude SARS-Cov-2 infection and should not be used as the sole basis for treatment or other patient management decisions. A negative result may occur with  improper specimen collection/handling, submission of specimen other than nasopharyngeal swab, presence of viral mutation(s) within the areas targeted by this assay, and inadequate number of viral copies(<138 copies/mL). A negative result must be combined with clinical observations, patient history, and epidemiological information. The expected result is Negative.  Fact Sheet for Patients:  EntrepreneurPulse.com.au  Fact Sheet for Healthcare Providers:  IncredibleEmployment.be  This test is no t yet approved or cleared by the [place] FDA and  has been authorized for detection and/or diagnosis of SARS-CoV-2 by FDA under an Emergency Use Authorization (EUA). This EUA will remain  in effect (meaning this test can be used) for the duration of the COVID-19 declaration under Section 564(b)(1) of the Act, 21 U.S.C.section 360bbb-3(b)(1), unless the authorization is terminated  or revoked sooner.       Influenza A by PCR NEGATIVE NEGATIVE Final   Influenza B by PCR NEGATIVE NEGATIVE Final    Comment: (NOTE) The Xpert Xpress SARS-CoV-2/FLU/RSV plus assay is intended as an aid in the diagnosis of influenza from Nasopharyngeal swab specimens and should not be used as a sole basis for treatment.  Nasal washings and aspirates are unacceptable for Xpert Xpress SARS-CoV-2/FLU/RSV testing.  Fact Sheet for Patients: EntrepreneurPulse.com.au  Fact Sheet for Healthcare Providers: IncredibleEmployment.be  This test is not yet approved or cleared by the [place] FDA and has been authorized for detection and/or diagnosis of SARS-CoV-2 by FDA under an Emergency Use Authorization (EUA). This EUA will remain in effect (meaning this test can be used) for the duration of the COVID-19 declaration under Section 564(b)(1) of the Act, 21 U.S.C. section 360bbb-3(b)(1), unless the authorization is terminated or revoked.  Performed at [facility], [place] [address] [place], [place] [address]   Blood culture (routine x 2)     Status: None (Preliminary result)   Collection Time: 09/30/20 11:36 PM   Specimen: BLOOD LEFT HAND  Result Value Ref Range Status   Specimen Description BLOOD LEFT HAND  Final   Special Requests   Final    BOTTLES DRAWN AEROBIC AND ANAEROBIC Blood Culture results may not be optimal due to an inadequate volume of blood received in culture bottles   Culture   Final    NO GROWTH 4 DAYS Performed at [facility], [place] [address], [place], [place] [address]    Report Status PENDING  Incomplete  Blood culture (routine x 2)     Status: Abnormal   Collection Time: 10/01/20 12:00 AM   Specimen: BLOOD  Result Value Ref Range Status   Specimen Description BLOOD SITE NOT SPECIFIED  Final   Special Requests   Final    BOTTLES DRAWN AEROBIC AND ANAEROBIC Blood Culture  results may not be optimal due to an inadequate volume of blood received in culture bottles   Culture  Setup Time   Final    GRAM POSITIVE COCCI AEROBIC BOTTLE ONLY CRITICAL RESULT CALLED TO, READ BACK BY AND VERIFIED WITH: PHARMD FRANK WILSON 10/03/20'@00'$ :31 BY TW    Culture (A)  Final    STREPTOCOCCUS SANGUINIS THE SIGNIFICANCE OF ISOLATING THIS ORGANISM FROM A  SINGLE SET OF BLOOD CULTURES WHEN MULTIPLE SETS ARE DRAWN IS UNCERTAIN. PLEASE NOTIFY THE MICROBIOLOGY DEPARTMENT WITHIN ONE WEEK IF SPECIATION AND SENSITIVITIES ARE REQUIRED. Performed at [facility], [place] [address] [place], [place] [address]    Report Status 10/04/2020 FINAL  Final  Blood Culture ID Panel (Reflexed)     Status: Abnormal   Collection Time: 10/01/20 12:00 AM  Result Value Ref Range Status   Enterococcus faecalis NOT DETECTED NOT DETECTED Final   Enterococcus Faecium NOT DETECTED NOT DETECTED Final   Listeria monocytogenes NOT DETECTED NOT DETECTED Final   Staphylococcus species NOT DETECTED NOT DETECTED Final   Staphylococcus aureus (BCID) NOT DETECTED NOT DETECTED Final   Staphylococcus epidermidis NOT DETECTED NOT DETECTED Final   Staphylococcus lugdunensis NOT DETECTED NOT DETECTED Final   Streptococcus species DETECTED (A) NOT DETECTED Final    Comment: Not Enterococcus species, Streptococcus agalactiae, Streptococcus pyogenes, or Streptococcus pneumoniae. CRITICAL RESULT CALLED TO, READ BACK BY AND VERIFIED WITH: PHARMD FRANK WILSON09/10/22'@23'$ :11 BY TW    Streptococcus agalactiae NOT DETECTED NOT DETECTED Final   Streptococcus pneumoniae NOT DETECTED NOT DETECTED Final   Streptococcus pyogenes NOT DETECTED NOT DETECTED Final   A.calcoaceticus-baumannii NOT DETECTED NOT DETECTED Final   Bacteroides fragilis NOT DETECTED NOT DETECTED Final   Enterobacterales NOT DETECTED NOT DETECTED Final   Enterobacter cloacae complex NOT DETECTED NOT DETECTED Final   Escherichia coli NOT DETECTED NOT DETECTED Final   Klebsiella aerogenes NOT DETECTED NOT DETECTED Final   Klebsiella oxytoca NOT DETECTED NOT DETECTED Final   Klebsiella pneumoniae NOT DETECTED NOT DETECTED Final   Proteus species NOT DETECTED NOT DETECTED Final   Salmonella species NOT DETECTED NOT DETECTED Final   Serratia marcescens NOT DETECTED NOT DETECTED Final   Haemophilus influenzae NOT DETECTED  NOT DETECTED Final   Neisseria meningitidis NOT DETECTED NOT DETECTED Final   Pseudomonas aeruginosa NOT DETECTED NOT DETECTED Final   Stenotrophomonas maltophilia NOT DETECTED NOT DETECTED Final   Candida albicans NOT DETECTED NOT DETECTED Final   Candida auris NOT DETECTED NOT DETECTED Final   Candida glabrata NOT DETECTED NOT DETECTED Final   Candida krusei NOT DETECTED NOT DETECTED Final   Candida parapsilosis NOT DETECTED NOT DETECTED Final   Candida tropicalis NOT DETECTED NOT DETECTED Final   Cryptococcus neoformans/gattii NOT DETECTED NOT DETECTED Final    Comment: Performed at [facility], [address] [place], [place] [address]  MRSA Next Gen by PCR, Nasal     Status: None   Collection Time: 10/01/20  6:38 PM   Specimen: Nasal Mucosa; Nasal Swab  Result Value Ref Range Status   MRSA by PCR Next Gen NOT DETECTED NOT DETECTED Final    Comment: (NOTE) The GeneXpert MRSA Assay (FDA approved for NASAL specimens only), is one component of a comprehensive MRSA colonization surveillance program. It is not intended to diagnose MRSA infection nor to guide or monitor treatment for MRSA infections. Test performance is not FDA approved in patients less than 40 years old. Performed at [facility], [place] [address]  [place], [place] [address]   Culture, blood (routine x 2)     Status: None (Preliminary result)   Collection Time: 10/03/20  4:52 PM   Specimen: BLOOD RIGHT ARM  Result Value Ref Range Status   Specimen Description BLOOD RIGHT ARM  Final   Special Requests   Final    BOTTLES DRAWN AEROBIC ONLY Blood Culture results may not be optimal due to an inadequate volume of blood received in culture bottles   Culture   Final    NO GROWTH < 24 HOURS Performed at [place] [address] [place], [place] [address]    Report Status PENDING  Incomplete  Culture, blood (routine x 2)     Status: None (Preliminary result)   Collection Time: 10/03/20  5:03 PM    Specimen: BLOOD LEFT HAND  Result Value Ref Range Status   Specimen Description BLOOD LEFT HAND  Final   Special Requests   Final    BOTTLES DRAWN AEROBIC ONLY Blood Culture results may not be optimal due to an inadequate volume of blood received in culture bottles   Culture   Final    NO GROWTH < 24 HOURS Performed at [facility], [place] [address], [place], [place] [address]    Report Status PENDING  Incomplete     Medications:    aspirin EC  81 mg Oral Daily   atorvastatin  20 mg Oral Daily   budesonide  0.25 mg Nebulization BID   Chlorhexidine Gluconate Cloth  6 each Topical Daily   clopidogrel  75 mg Oral Daily   FLUoxetine  40 mg Oral Daily   insulin aspart  2-6 Units Subcutaneous TID AC & HS   insulin detemir  7 Units Subcutaneous Q12H   levothyroxine  100 mcg Oral Q0600   metoprolol tartrate  12.5 mg Oral BID   pantoprazole  40 mg Oral Daily   sodium chloride flush  3 mL Intravenous Q12H   sodium chloride flush  3 mL Intravenous Q12H   spironolactone  12.5 mg Oral Daily   sucralfate  1 g Oral TID WC & HS   Continuous Infusions:  sodium chloride     cefTRIAXone (ROCEPHIN)  IV Stopped (10/04/20 0839)   sodium chloride        LOS: 5 days   Charlynne Cousins  [facility]  10/05/2020, 7:22 AM

## 2020-10-05 NOTE — Progress Notes (Signed)
Patient expressed concerns with new dose on insulin coverage compared to normal trend or insulin coverage at home. This RN reached out to Hospitalist. MD stated to continue with current orders and if CBG continue to down trend then insulin pump can be placed back on this afternoon.

## 2020-10-05 NOTE — Progress Notes (Signed)
Heart Failure Nurse Navigator Progress Note  PCP: [name], NP PCP-Cardiologist: Ashok Norris., MD Admission Diagnosis: DKA, NSTEMI Admitted from: home alone  Presentation:   Jennifer Hogan presented 9/9 with SOB, and some chest pain, found to be in DKA. Pt underwent LHC 9/12. Pt interactive during interview process. Expressed frustration with inability to get placed back on insulin pump per primary team, pt awaiting transfer out of ICU. Pt states she has met financial hardship throughout the pandemic, has support of neighbor friends who have helped financially with food intermittently. 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 Pt states she has filled out medicaid application with hospital staff. Would appreciate resources for food stamps, if she qualifies. States she does have food at home currently. Pt still drives, but would like to enroll in [employer] as a back up. [facility] and pharmacy needs.  Pt states she is willing to try a baby statin if needed and titrate up. Has concerns with starting new BP meds with her history of low pressures.   ECHO/ LVEF: 20-25%, G1DD  Clinical Course:  Past Medical History:  Diagnosis Date   Anemia    Anxiety    Coronary artery disease    a. s/p PTCA DES x3 in the LAD (ostial DES placed, mid DES placed, distal DES placed) and PTCA/DES x1 to intermediate branch 02/2017. b. ACS 02/08/18 with LCx stent placement.   Depression    Diabetes mellitus    type 1, on insulin pump   DKA (diabetic ketoacidoses) 12/31/2017   Elevated platelet count    High cholesterol    Hypothyroidism    Ischemic cardiomyopathy    a. EF low-normal with basal inferior akinesis, basal septal hypokinesis by echo 01/2018.     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"   Occupation: adjunct facilty at [employer]    Comment: Chemistry professor  Tobacco Use   Smoking status: Never   Smokeless tobacco: Never   Tobacco comments:    Use marijuana  Vaping Use   Vaping Use: Never used  Substance and Sexual Activity   Alcohol use: No    Alcohol/week: 0.0 standard drinks   Drug use: Not Currently    Types: Marijuana    Comment: last use last week, occaisonal marijuana use   Sexual activity: Not Currently    Birth control/protection: None  Other Topics Concern   Not on file  Social History Narrative   Not on file   Social Determinants of Health   Financial Resource Strain: High Risk   Difficulty of Paying Living Expenses: Very hard  Food Insecurity: Food Insecurity Present   Worried About Running Out of Food in the Last Year: Sometimes true   Ran Out of Food in the Last Year: Sometimes true  Transportation Needs: No Transportation Needs   Lack of Transportation (Medical): No   Lack of Transportation (Non-Medical): No  Physical Activity: Not on file  Stress: Not on  file  Social Connections: Not on file    High Risk Criteria for Readmission and/or Poor Patient Outcomes: Heart failure hospital admissions (last 6 months): 1  No Show rate: 9% Difficult social situation: yes Demonstrates medication adherence: yes Primary Language: English Literacy level: Able to read/write and comprehend.  Education Assessment and Provision:  Detailed education and instructions provided on heart failure disease management including the following:  Signs and symptoms of Heart Failure When to call the physician Importance of daily weights Low sodium diet Fluid restriction Medication  management Anticipated future follow-up appointments  Patient education given on each of the above topics.  Patient acknowledges understanding via teach back method and acceptance of all instructions.  Education Materials:  "Living Better With Heart Failure" Booklet, HF zone tool, & Daily Weight Tracker Tool.  Patient has scale at home: yes Patient has pill box at home: no, given from AHF clinic.   Barriers of Care:   -new dx -financial issues  Considerations/Referrals:   Referral made to Heart Failure Pharmacist Stewardship: yes, to see at [place] Referral made to Heart Failure CSW/NCM TOC: yes, to see at [place] Referral made to Heart & Vascular TOC clinic: yes, 9/22 @ 11AM  Items for Follow-up on DC/TOC: -optimize -pt assistance -[employer]  Pricilla Holm, MSN, RN Heart Failure Nurse Navigator [phone]

## 2020-10-05 NOTE — Consult Note (Signed)
[facility] for Infectious Disease    Date of Admission:  09/30/2020     Reason for Consult: Strep bacteremia     Referring Physician: Dr Olevia Bowens  Current antibiotics: Ceftriaxone 9/8-pres   ASSESSMENT:    50 y.o. female admitted with:  Strep bacteremia: Admission blood cultures positive in 1 out of 4 bottles with repeat cultures negative.  Suspicious for contaminant but has received 5 days of appropriate antibiotics nonetheless. NSTEMI Acute systolic heart failure DKA  RECOMMENDATIONS:    Suspect that the strep species in 1 out of 4 admission blood cultures to be a contaminant.  Her repeat blood cultures are negative and she has no infectious signs or symptoms.  Nonetheless, she has received over 5 days of appropriate antibiotics in the setting of uncomplicated bacteremia.  Will stop her antibiotics.  Please call with any questions.   Active Problems:   DKA (diabetic ketoacidosis) (HCC)   Non-ST elevation (NSTEMI) myocardial infarction ([place])   DKA, type 1 ([place])   ARF (acute renal failure) (HCC)   Acute systolic congestive heart failure (HCC)   DCM (dilated cardiomyopathy) (HCC)   MEDICATIONS:    Scheduled Meds: . aspirin EC  81 mg Oral Daily  . atorvastatin  20 mg Oral Daily  . budesonide  0.25 mg Nebulization BID  . Chlorhexidine Gluconate Cloth  6 each Topical Daily  . clopidogrel  75 mg Oral Daily  . FLUoxetine  40 mg Oral Daily  . insulin aspart  0-15 Units Subcutaneous TID WC  . insulin aspart  0-5 Units Subcutaneous QHS  . insulin aspart  4 Units Subcutaneous TID WC  . insulin detemir  12 Units Subcutaneous Q12H  . levothyroxine  100 mcg Oral Q0600  . metoprolol succinate  12.5 mg Oral Daily  . pantoprazole  40 mg Oral Daily  . sodium chloride flush  3 mL Intravenous Q12H  . sodium chloride flush  3 mL Intravenous Q12H  . spironolactone  12.5 mg Oral Daily  . sucralfate  1 g Oral TID WC & HS   Continuous Infusions: . sodium chloride    . sodium  chloride     PRN Meds:.sodium chloride, acetaminophen, albuterol, dextrose, dextrose, hydrOXYzine, labetalol, metoCLOPramide (REGLAN) injection, nitroGLYCERIN, ondansetron (ZOFRAN) IV, sodium chloride flush  HPI:    50 year old woman with a past medical history of type 1 diabetes, CAD, history of CABG and stent, chronic diastolic heart failure who was admitted with DKA due to insulin pump malfunction.  During her hospital admission she was also found to have elevated troponins and diagnosed with NSTEMI treated with IV heparin.  She was also found to have new LV dysfunction on echocardiogram with EF 20 to 25%.  Her admission blood cultures were positive for strep sanguinous in 1 out of 4 bottles.  Repeat blood cultures were obtained and have been no growth.  She has been on ceftriaxone since admission on 09/30/2020 and received a dose this morning.    Past Medical History:  Diagnosis Date  . Anemia   . Anxiety   . Coronary artery disease    a. s/p PTCA DES x3 in the LAD (ostial DES placed, mid DES placed, distal DES placed) and PTCA/DES x1 to intermediate branch 02/2017. b. ACS 02/08/18 with LCx stent placement.  . Depression   . Diabetes mellitus    type 1, on insulin pump  . DKA (diabetic ketoacidoses) 12/31/2017  . Elevated platelet count   . High cholesterol   . Hypothyroidism   .  Ischemic cardiomyopathy    a. EF low-normal with basal inferior akinesis, basal septal hypokinesis by echo 01/2018.    Social History   Tobacco Use  . Smoking status: Never  . Smokeless tobacco: Never  . Tobacco comments:    Use marijuana  Vaping Use  . Vaping Use: Never used  Substance Use Topics  . Alcohol use: No    Alcohol/week: 0.0 standard drinks  . Drug use: Not Currently    Types: Marijuana    Comment: last use last week, occaisonal marijuana use    Family History  Problem Relation Age of Onset  . Cancer Mother        T cell lymphoma  . Coronary artery disease Father   . Congestive Heart  Failure Father   . Asthma Father     Allergies  Allergen Reactions  . Atorvastatin Other (See Comments)    Myalgias on '80mg'$  dose, tolerates '20mg'$  ok  . Crestor [Rosuvastatin Calcium] Other (See Comments)    Severe myalgias and joint pain. Has tolerated atorvastatin though  . Monosodium Glutamate Nausea And Vomiting and Other (See Comments)    migraine  . Peanut-Containing Drug Products Other (See Comments)    Vomiting, upset stomach and some wheezing  . Zetia [Ezetimibe] Other (See Comments)    myalgias  . Ciprofloxin Hcl [Ciprofloxacin] Nausea And Vomiting and Other (See Comments)    Severe migraine  . Food Color Red [Red Dye] Diarrhea    Review of Systems  Constitutional:  Negative for chills and fever.  Respiratory: Negative.    Cardiovascular: Negative.   Genitourinary: Negative.   Musculoskeletal: Negative.   All other systems reviewed and are negative.  OBJECTIVE:   Blood pressure 127/78, pulse 97, temperature 98.2 F (36.8 C), temperature source Oral, resp. rate (!) 22, height '5\' 5"'$  (1.651 m), weight 61 kg, SpO2 100 %. Body mass index is 22.38 kg/m.  Physical Exam Constitutional:      General: She is not in acute distress.    Appearance: Normal appearance.  HENT:     Head: Normocephalic and atraumatic.  Eyes:     General: No scleral icterus.    Extraocular Movements: Extraocular movements intact.     Conjunctiva/sclera: Conjunctivae normal.  Pulmonary:     Effort: Pulmonary effort is normal. No respiratory distress.  Abdominal:     General: There is no distension.  Musculoskeletal:     Cervical back: Normal range of motion and neck supple.  Skin:    General: Skin is warm and dry.     Findings: No rash.  Neurological:     General: No focal deficit present.     Mental Status: She is alert and oriented to person, place, and time.  Psychiatric:        Mood and Affect: Mood normal.        Behavior: Behavior normal.     Lab Results: Lab Results   Component Value Date   WBC 6.6 10/05/2020   HGB 12.1 10/05/2020   HCT 35.9 (L) 10/05/2020   MCV 95.5 10/05/2020   PLT 375 10/05/2020    Lab Results  Component Value Date   NA 133 (L) 10/05/2020   K 4.4 10/05/2020   CO2 25 10/05/2020   GLUCOSE 334 (H) 10/05/2020   BUN 10 10/05/2020   CREATININE 0.94 10/05/2020   CALCIUM 8.5 (L) 10/05/2020   GFRNONAA >60 10/05/2020   GFRAA >60 07/14/2019    Lab Results  Component Value Date   ALT  18 07/30/2020   AST 30 07/30/2020   ALKPHOS 81 07/30/2020   BILITOT 0.3 07/30/2020       Component Value Date/Time   CRP 6.2 (H) 10/03/2020 1448       Component Value Date/Time   ESRSEDRATE 55 (H) 10/03/2020 1448    I have reviewed the micro and lab results in Epic.  Imaging: CARDIAC CATHETERIZATION  Addendum Date: 10/04/2020   .  Mid LAD lesion is 60% stenosed. Colon Flattery Cx to Prox Cx lesion is 99% stenosed. .  Prox Cx to Mid Cx lesion is 70% stenosed. .  Ramus-1 lesion is 60% stenosed. .  Ramus-2 lesion is 20% stenosed. .  Ramus-3 lesion is 70% stenosed. .  Prox LAD to Mid LAD lesion is 100% stenosed. .  Dist LAD-1 lesion is 70% stenosed. .  Dist LAD-2 lesion is 70% stenosed. .  Non-stenotic Mid LAD to Dist LAD lesion was previously treated. Marland Kitchen  RIMA graft was visualized by angiography and is small. Marland Kitchen  LIMA and is large. .  The graft exhibits no disease. .  LV end diastolic pressure is normal. Severe native vessel CAD with total occlusion of the proximal LAD, moderate stenosis of the intermediate branch, and severe ostial circumflex in-stent restenosis S/P CABG with patency of the both the RIMA-circumflex and LAD sequential to the intermediate and LAD Normal LVEDP Recommend: continued medical therapy  Result Date: 10/04/2020 .  Prox LAD to Mid LAD lesion is 10% stenosed. .  Dist LAD-1 lesion is 99% stenosed. .  Dist LAD-2 lesion is 99% stenosed. .  Mid LAD lesion is 60% stenosed. Colon Flattery Cx to Prox Cx lesion is 99% stenosed. .  Prox Cx to Mid Cx  lesion is 70% stenosed. .  Ramus-1 lesion is 60% stenosed. .  Ramus-2 lesion is 20% stenosed. .  Ramus-3 lesion is 70% stenosed. .  Non-stenotic Mid LAD to Dist LAD lesion was previously treated. Marland Kitchen  RIMA graft was visualized by angiography and is small. Marland Kitchen  LIMA and is large. .  The graft exhibits no disease. .  LV end diastolic pressure is normal. Severe native vessel CAD with total occlusion of the proximal LAD, moderate stenosis of the intermediate branch, and severe ostial circumflex in-stent restenosis S/P CABG with patency of the both the RIMA-circumflex and LAD sequential to the intermediate and LAD Normal LVEDP Recommend: continued medical therapy     Imaging  independently reviewed in Epic.  [facility] for Infectious Disease [facility] [phone] pager 10/05/2020, 11:11 AM

## 2020-10-05 NOTE — Plan of Care (Signed)
  Problem: Education: Goal: Knowledge of General Education information will improve Description: Including pain rating scale, medication(s)/side effects and non-pharmacologic comfort measures Outcome: Progressing   Problem: Health Behavior/Discharge Planning: Goal: Ability to manage health-related needs will improve Outcome: Progressing   Problem: Clinical Measurements: Goal: Ability to maintain clinical measurements within normal limits will improve Outcome: Progressing Goal: Will remain free from infection Outcome: Progressing Goal: Diagnostic test results will improve Outcome: Progressing Goal: Respiratory complications will improve Outcome: Progressing Goal: Cardiovascular complication will be avoided Outcome: Progressing   Problem: Activity: Goal: Risk for activity intolerance will decrease Outcome: Progressing   Problem: Elimination: Goal: Will not experience complications related to bowel motility Outcome: Progressing Goal: Will not experience complications related to urinary retention Outcome: Progressing   Problem: Pain Managment: Goal: General experience of comfort will improve Outcome: Progressing   Problem: Safety: Goal: Ability to remain free from injury will improve Outcome: Progressing   

## 2020-10-05 NOTE — Telephone Encounter (Signed)
LOV 02/27/2020 Vm left for patient to contact office and schedule appt  Last filled on 08/26/2020

## 2020-10-05 NOTE — Progress Notes (Signed)
Heart Failure Navigation Team Progress Note  PCP: [name], NP Primary Cardiologist: Ashok Norris., MD Admitted from: home alone  Past Medical History:  Diagnosis Date   Anemia    Anxiety    Coronary artery disease    a. s/p PTCA DES x3 in the LAD (ostial DES placed, mid DES placed, distal DES placed) and PTCA/DES x1 to intermediate branch 02/2017. b. ACS 02/08/18 with LCx stent placement.   Depression    Diabetes mellitus    type 1, on insulin pump   DKA (diabetic ketoacidoses) 12/31/2017   Elevated platelet count    High cholesterol    Hypothyroidism    Ischemic cardiomyopathy    a. EF low-normal with basal inferior akinesis, basal septal hypokinesis by echo 01/2018.    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"   Occupation: adjunct facilty at [employer]    Comment: Chemistry professor  Tobacco Use   Smoking status: Never   Smokeless tobacco: Never   Tobacco comments:    Use marijuana  Vaping Use   Vaping Use: Never used  Substance and Sexual Activity   Alcohol use: No    Alcohol/week: 0.0 standard drinks   Drug use: Not Currently    Types: Marijuana    Comment: last use last week, occaisonal marijuana use   Sexual activity: Not Currently    Birth control/protection: None  Other Topics Concern   Not on file  Social History Narrative   Not on file   Social Determinants of Health   Financial Resource Strain: High Risk   Difficulty of Paying Living Expenses: Very hard  Food Insecurity: Food Insecurity Present   Worried About Running Out of Food in the Last Year: Sometimes true   Ran Out of Food in the Last Year: Sometimes true  Transportation Needs: No Transportation Needs   Lack of Transportation  (Medical): No   Lack of Transportation (Non-Medical): No  Physical Activity: Not on file  Stress: Not on file  Social Connections: Not on file     Heart & Vascular Transition of Care Clinic follow-up: Scheduled for 9/22 @ 11AM.  Confirmed patient enrolled transportation.  Immediate social needs: transportation  CSW enrolled Ms. [place], MSW, [facility] Social Worker

## 2020-10-06 ENCOUNTER — Other Ambulatory Visit: Payer: Self-pay

## 2020-10-06 DIAGNOSIS — I200 Unstable angina: Secondary | ICD-10-CM

## 2020-10-06 DIAGNOSIS — I25110 Atherosclerotic heart disease of native coronary artery with unstable angina pectoris: Secondary | ICD-10-CM

## 2020-10-06 LAB — CBC
HCT: 38.3 % (ref 36.0–46.0)
Hemoglobin: 12.7 g/dL (ref 12.0–15.0)
MCH: 31.4 pg (ref 26.0–34.0)
MCHC: 33.2 g/dL (ref 30.0–36.0)
MCV: 94.6 fL (ref 80.0–100.0)
Platelets: 465 10*3/uL — ABNORMAL HIGH (ref 150–400)
RBC: 4.05 MIL/uL (ref 3.87–5.11)
RDW: 13.7 % (ref 11.5–15.5)
WBC: 7.8 10*3/uL (ref 4.0–10.5)
nRBC: 0 % (ref 0.0–0.2)

## 2020-10-06 LAB — GLUCOSE, CAPILLARY
Glucose-Capillary: 181 mg/dL — ABNORMAL HIGH (ref 70–99)
Glucose-Capillary: 251 mg/dL — ABNORMAL HIGH (ref 70–99)

## 2020-10-06 MED ORDER — SPIRONOLACTONE 25 MG PO TABS
12.5000 mg | ORAL_TABLET | Freq: Every day | ORAL | 0 refills | Status: DC
  Filled 2020-10-06: qty 15, 30d supply, fill #0

## 2020-10-06 MED ORDER — METOPROLOL SUCCINATE ER 25 MG PO TB24
12.5000 mg | ORAL_TABLET | Freq: Every day | ORAL | 0 refills | Status: DC
  Filled 2020-10-06: qty 15, 30d supply, fill #0

## 2020-10-06 MED ORDER — INSULIN LISPRO 100 UNIT/ML IJ SOLN
INTRAMUSCULAR | 1 refills | Status: DC
  Filled 2020-10-06: qty 10, 28d supply, fill #0
  Filled 2020-11-02: qty 10, 28d supply, fill #1

## 2020-10-06 MED ORDER — ATORVASTATIN CALCIUM 20 MG PO TABS
20.0000 mg | ORAL_TABLET | Freq: Every day | ORAL | 0 refills | Status: DC
  Filled 2020-10-06: qty 30, 30d supply, fill #0

## 2020-10-06 MED ORDER — BUDESONIDE 0.25 MG/2ML IN SUSP
0.2500 mg | Freq: Two times a day (BID) | RESPIRATORY_TRACT | 12 refills | Status: DC
  Filled 2020-10-06: qty 60, 15d supply, fill #0
  Filled 2020-10-20: qty 60, 15d supply, fill #1

## 2020-10-06 MED ORDER — SUCRALFATE 1 GM/10ML PO SUSP
1.0000 g | Freq: Three times a day (TID) | ORAL | 0 refills | Status: DC
  Filled 2020-10-06: qty 420, 11d supply, fill #0

## 2020-10-06 MED ORDER — LOSARTAN POTASSIUM 25 MG PO TABS
12.5000 mg | ORAL_TABLET | Freq: Every day | ORAL | 0 refills | Status: DC
  Filled 2020-10-06: qty 15, 30d supply, fill #0

## 2020-10-06 NOTE — Discharge Instructions (Signed)
Information about your medication: Statin (cholesterol-lowering agent)  Generic Name (Brand): atorvastatin (Lipitor) PURPOSE: You are taking this medication to lower your "bad" cholesterol (LDL) and to prevent heart attacks and strokes. Statins can also raise your "good" cholesterol (HDL).  Common SIDE EFFECTS you may experience include: muscle pain or weakness (especially in the legs) and upset stomach.  Take your medication exactly as prescribed. Do not eat large amounts of grapefruit or grapefruit juice while taking this medication.  Contact your health care provider if you experience: severe muscle pain that does not improve, dark urine, or yellowing of your skin or eyes. ---------------------------------------------------------------------------------------------------------------------- Information about your medication: Plavix (anti-platelet agent)  Generic Name (Brand): clopidogrel (Plavix), once daily medication  PURPOSE: You are taking this medication along with aspirin to lower your chance of having a heart attack, stroke, or blood clots in your heart. These can be fatal. Plavix and aspirin help prevent platelets from sticking together and forming a clot that can block an artery.  Common SIDE EFFECTS you may experience include: bruising or bleeding more easily, shortness of breath  Do not stop taking PLAVIX without talking to the doctor who prescribes it for you. People who are treated with a stent and stop taking Plavix too soon, have a higher risk of getting a blood clot in the stent, having a heart attack, or dying. If you stop Plavix because of bleeding, or for other reasons, your risk of a heart attack or stroke may increase.   Tell all of your doctors and dentists that you are taking Plavix. They should talk to the doctor who prescribed Plavix for you before you have any surgery or invasive procedure.   Contact your health care provider if you experience: severe or  uncontrollable bleeding, pink/red/brown urine, vomiting blood or vomit that looks like "coffee grounds", red or black stools (looks like tar), coughing up blood or blood clots ---------------------------------------------------------------------------------------------------------------------- Information about your medication:  Beta Blocker (Heart rate/Blood Pressure-lowering agent)  Generic Name (Brand):  metoprolol succinate (Toprol XL)  PURPOSE: You are taking this medication to lower blood pressure and heart rate to help prevent further damage to your heart and to reduce your risk of death following your cardiac event.   Common SIDE EFFECTS you may experience include: fatigue, dizziness, diarrhea, or nausea. This medication may also mask the signs of hypoglycemia  Take your medication exactly as prescribed.   Contact your health care provider if you experience: severely lower bllod pressure, syncope, palpitations, or chest pain muscle ----------------------------------------------------------------------------------------------------------------------

## 2020-10-06 NOTE — Care Management (Signed)
1042 10-06-20 Case Manager spoke with the patient this morning regarding medications and cost. Patient states she will be able to afford her medications today at the [facility] due to one of the kindergarten teachers will be paying for them. No further assistance needed at this time.

## 2020-10-06 NOTE — Progress Notes (Signed)
Inpatient Diabetes Program Recommendations  AACE/ADA: New Consensus Statement on Inpatient Glycemic Control   Target Ranges:  Prepandial:   less than 140 mg/dL      Peak postprandial:   less than 180 mg/dL (1-2 hours)      Critically ill patients:  140 - 180 mg/dL   Results for Jennifer Hogan, Jennifer Hogan (MRN [id number]) as of 10/06/2020 10:31  Ref. Range 10/05/2020 08:02 10/05/2020 09:51 10/05/2020 11:22 10/05/2020 12:46 10/05/2020 14:47 10/05/2020 15:07 10/05/2020 15:46 10/05/2020 21:48 10/06/2020 06:21  Glucose-Capillary Latest Ref Range: 70 - 99 mg/dL 249 (H) 275 (H) 156 (H) 236 (H) 42 (LL) 55 (L) 76 171 (H) 181 (H)    Review of Glycemic Control  Diabetes history: DM type 1 Outpatient Diabetes medications: insulin pump with Humalog basal rate of 0.7 units/hr, 6 AM-10 AM, 0.9 units/hr, 10 AM-10 PM, and 0.625 units/hr overnight (when not in "auto mode"); Total of 16.8 units of basal insulin in a 24 hour period; bolus of 1 unit/16 grams CHO; correction bolus of 1 unit for each 75 mg/dl above 100 mg/dl Current orders for Inpatient glycemic control: Insulin Pump ACHS&2am   NOTE: Spoke with patient regarding DM. Patient states she is now running her basal rates at 100% (she started them early this morning at a decreased percentage but has increased them to 100% now that glucose is rising. Patient has her glucometer at bedside and has been keeping a close check on glucose since she restarted her insulin pump yesterday. Patient usually uses the Medtronic CGM but does not have supplies here at the hospital to restart CGM.  Provided FreeStyle Libre2 CGMs sensors (with order from Dr. Benny Lennert to provide). Patient applied FreeStyle Libre2 to back of right upper arm. Patient states she is being discharged home today and she is very pleased to get the FreeStyle Libre2 sensors. Patient states that she has everything she needs for glucose monitoring and DM management. Patient appreciative of visit and FreeStyle Libre2 sensors  and she states she has no further questions or concerns at this time.   Thanks, Barnie Alderman, RN, MSN, CDE Diabetes Coordinator Inpatient Diabetes Program [phone] (Team Pager from 8am to 5pm)

## 2020-10-06 NOTE — Progress Notes (Signed)
Progress Note  Patient Name: Jennifer Hogan Date of Encounter: 10/06/2020  [facility] Cardiologist: Fransico Him, MD   Subjective   LHC yesterday showed severe native vessel CAD with total occlusion of the proximal LAD, moderate IM stenosis and severe ostial LCx in-stent restenosis with patency of the RIMA>LCx and SVG sequential to IM and LAD.  LVEDP was normal and medical management recommended.    Feeling good and denies any CP or SOB  Inpatient Medications    Scheduled Meds:  aspirin EC  81 mg Oral Daily   atorvastatin  20 mg Oral Daily   budesonide  0.25 mg Nebulization BID   Chlorhexidine Gluconate Cloth  6 each Topical Daily   clopidogrel  75 mg Oral Daily   enoxaparin (LOVENOX) injection  40 mg Subcutaneous Q24H   FLUoxetine  40 mg Oral Daily   insulin pump   Subcutaneous TID WC, HS, 0200   levothyroxine  100 mcg Oral Q0600   losartan  12.5 mg Oral Daily   metoprolol succinate  12.5 mg Oral Daily   pantoprazole  40 mg Oral Daily   sodium chloride flush  3 mL Intravenous Q12H   sodium chloride flush  3 mL Intravenous Q12H   spironolactone  12.5 mg Oral Daily   sucralfate  1 g Oral TID WC & HS   Continuous Infusions:  sodium chloride     sodium chloride     PRN Meds: sodium chloride, acetaminophen, albuterol, dextrose, dextrose, hydrOXYzine, labetalol, metoCLOPramide (REGLAN) injection, nitroGLYCERIN, ondansetron (ZOFRAN) IV, sodium chloride flush   Vital Signs    Vitals:   10/06/20 0300 10/06/20 0449 10/06/20 0500 10/06/20 0807  BP:  (!) 137/94    Pulse:  80    Resp: 17 13 (!) 28   Temp: 98.5 F (36.9 C)  97.9 F (36.6 C)   TempSrc: Oral  Oral   SpO2:  99%  99%  Weight:      Height:        Intake/Output Summary (Last 24 hours) at 10/06/2020 0831 Last data filed at 10/06/2020 0800 Gross per 24 hour  Intake 500 ml  Output --  Net 500 ml    Last 3 Weights 10/04/2020 10/03/2020 10/01/2020  Weight (lbs) 134 lb 7.7 oz 137 lb 12.6 oz 137 lb 2 oz   Weight (kg) 61 kg 62.5 kg 62.2 kg      Telemetry    NSR- Personally Reviewed   Physical Exam  GEN: Well nourished, well developed in no acute distress HEENT: Normal NECK: No JVD; No carotid bruits LYMPHATICS: No lymphadenopathy CARDIAC:RRR, no murmurs, rubs, gallops RESPIRATORY:  Clear to auscultation without rales, wheezing or rhonchi  ABDOMEN: Soft, non-tender, non-distended MUSCULOSKELETAL:  No edema; No deformity  SKIN: Warm and dry NEUROLOGIC:  Alert and oriented x 3 PSYCHIATRIC:  Normal affect   Labs    High Sensitivity Troponin:   Recent Labs  Lab 09/30/20 1658 09/30/20 1853 10/01/20 1553  TROPONINIHS 61* 54* >24,000*       Chemistry Recent Labs  Lab 10/04/20 0009 10/05/20 0037 10/05/20 0841  NA 134* 137 133*  K 3.5 5.1 4.4  CL 99 102 99  CO2 _0 GLUCOSE 161* 120* 334*  BUN _1 CREATININE 0.96 0.91 0.94  CALCIUM 8.6* 8.9 8.5*  GFRNONAA >60 >60 >60  ANIONGAP _2 Hematology Recent Labs  Lab 10/04/20 0009 10/05/20 0037 10/06/20 0127  WBC 8.3 6.6 7.8  RBC 4.04 3.76* 4.05  HGB 12.8 12.1 12.7  HCT 37.7 35.9* 38.3  MCV 93.3 95.5 94.6  MCH 31.7 32.2 31.4  MCHC 34.0 33.7 33.2  RDW 13.4 13.8 13.7  PLT 470* 375 465*     Radiology    CARDIAC CATHETERIZATION  Addendum Date: 10/04/2020     Mid LAD lesion is 60% stenosed.   Ost Cx to Prox Cx lesion is 99% stenosed.   Prox Cx to Mid Cx lesion is 70% stenosed.   Ramus-1 lesion is 60% stenosed.   Ramus-2 lesion is 20% stenosed.   Ramus-3 lesion is 70% stenosed.   Prox LAD to Mid LAD lesion is 100% stenosed.   Dist LAD-1 lesion is 70% stenosed.   Dist LAD-2 lesion is 70% stenosed.   Non-stenotic Mid LAD to Dist LAD lesion was previously treated.   RIMA graft was visualized by angiography and is small.   LIMA and is large.   The graft exhibits no disease.   LV end diastolic pressure is normal. Severe native vessel CAD with total occlusion of the proximal LAD, moderate stenosis of the  intermediate branch, and severe ostial circumflex in-stent restenosis S/P CABG with patency of the both the RIMA-circumflex and LAD sequential to the intermediate and LAD Normal LVEDP Recommend: continued medical therapy  Result Date: 10/04/2020   Prox LAD to Mid LAD lesion is 10% stenosed.   Dist LAD-1 lesion is 99% stenosed.   Dist LAD-2 lesion is 99% stenosed.   Mid LAD lesion is 60% stenosed.   Ost Cx to Prox Cx lesion is 99% stenosed.   Prox Cx to Mid Cx lesion is 70% stenosed.   Ramus-1 lesion is 60% stenosed.   Ramus-2 lesion is 20% stenosed.   Ramus-3 lesion is 70% stenosed.   Non-stenotic Mid LAD to Dist LAD lesion was previously treated.   RIMA graft was visualized by angiography and is small.   LIMA and is large.   The graft exhibits no disease.   LV end diastolic pressure is normal. Severe native vessel CAD with total occlusion of the proximal LAD, moderate stenosis of the intermediate branch, and severe ostial circumflex in-stent restenosis S/P CABG with patency of the both the RIMA-circumflex and LAD sequential to the intermediate and LAD Normal LVEDP Recommend: continued medical therapy     Patient Profile     50 year old female with past medical history of coronary artery disease status post coronary artery bypass and graft (LIMA to the LAD/ramus intermedius and RIMA to the obtuse marginal), diabetes mellitus, hyperlipidemia admitted with DKA being evaluated for non-ST elevation myocardial infarction.  Patient developed severe respiratory distress on September 10 due to pulmonary edema and was transferred to CCU.  Echocardiogram shows newly reduced LV function with ejection fraction 20 to 08%, grade 1 diastolic dysfunction, small pericardial effusion.  Assessment & Plan    Non-ST elevation myocardial infarction -has had intermittent CP for over 2 weeks -echo this admit with new LV dysfunction -pleuritic CP isolated to 9/11 with elevated ESR ? Acute pericarditis but pain is different  than her typical angina she has had over the past 2 weeks -hsTrop peaked at >24,000 -ESR is very nonspecific and since she has not had any further CP at this time will hold off for now with [place] -cath showed severe native vessel CAD with total occlusion of the proximal LAD, moderate IM stenosis and severe ostial LCx in-stent restenosis with patency of the RIMA>LCx and SVG sequential to IM and LAD.  LVEDP was normal and medical management recommended.   -suspect she had stress MI>>her mother passed away 3 weeks ago and she has been very upset -she has not had any further CP -continue aspirin, Plavix and Toprol XL 12.72m daily -Patient is intolerant to statins in the past but started on Atorvastatin 29mthis admit -will get into lipid clinic outpt  Acute systolic congestive heart failure -LVEDP normal at cath -new LV dysfunction on echo with EF 20-25% ? Stress CM -continue Toprol XL 12.54m70maily and Losartan 12.54mg50mily  -will try to titrate HF meds as BP allows -will need repeat echo in 2 months to see if LVF has normalized  Ischemic cardiomyopathy- -Blood pressure stable this am -continue spiro 12.54mg 71mly, Toprol XL 12.54mg d44my and Losartan 12.54mg da67m -SCr stable at 0.94 and K+ 4.4  Acute kidney injury -in the setting of DKA.   -SCr stable at 0.94 and K+ 4.4  DKA -management per primary service.  1 of 2 blood cultures positive for strep -felt to be contaminant  Hyperlipidemia -patient apparently has an intolerance to statins and Zetia but currently is tolerating low dose Atorvastatin -Following discharge will need to follow-up in the lipid clinic for consideration of Repatha or Praluent as I do not think her LDL, which is 166, will get to goal on low dose statin  [facility] will sign off.   Medication Recommendations:  ASA 81mg da48m Atorvastatin 20mg dai66mPlavix 754mg dail38mosartan 12.54mg daily,89mprol XL 12.54mg daily, 79mronolactone 12.54mg daily Ot354m  recommendations (labs, testing, etc):  2D echo in 2 months Follow up as an outpatient:  TOC clinic in 2 weeks for uptitration of HF meds    For questions or updates, please contact [facility] [email or web address] for contact info under        Signed, Shamaria Kavan,Fransico Him22, 8:31 AM

## 2020-10-07 ENCOUNTER — Other Ambulatory Visit: Payer: Self-pay

## 2020-10-07 ENCOUNTER — Telehealth: Payer: Self-pay

## 2020-10-07 NOTE — Telephone Encounter (Signed)
Transition Care Management Unsuccessful Follow-up Telephone Call  Date of discharge and from where:  10/06/2020, [facility]   Attempts:  1st Attempt  Reason for unsuccessful TCM follow-up call:  Left voice message on # [phone].    Patient has appointment with [name], NP @ [facility] - 10/29/2020.

## 2020-10-08 ENCOUNTER — Telehealth: Payer: Self-pay

## 2020-10-08 ENCOUNTER — Ambulatory Visit (INDEPENDENT_AMBULATORY_CARE_PROVIDER_SITE_OTHER): Payer: Medicaid Other | Admitting: Endocrinology

## 2020-10-08 ENCOUNTER — Encounter: Payer: Self-pay | Admitting: Endocrinology

## 2020-10-08 ENCOUNTER — Other Ambulatory Visit: Payer: Self-pay

## 2020-10-08 VITALS — BP 110/80 | HR 114 | Ht 65.0 in | Wt 132.0 lb

## 2020-10-08 DIAGNOSIS — R5382 Chronic fatigue, unspecified: Secondary | ICD-10-CM

## 2020-10-08 DIAGNOSIS — E1059 Type 1 diabetes mellitus with other circulatory complications: Secondary | ICD-10-CM

## 2020-10-08 LAB — POCT GLYCOSYLATED HEMOGLOBIN (HGB A1C): Hemoglobin A1C: 8.7 % — AB (ref 4.0–5.6)

## 2020-10-08 LAB — CULTURE, BLOOD (ROUTINE X 2)
Culture: NO GROWTH
Culture: NO GROWTH

## 2020-10-08 NOTE — Patient Instructions (Addendum)
Please continue these pump settings: basal rate of 0.7 units/hr, 6 AM-10 AM, 0.8 units/hr, 10 AM-10 PM, and 0.625 units/hr overnight (when not in "auto mode").  bolus of 1 unit/16 grams carbohydrate.  correction bolus (which some people call "sensitivity," or "insulin sensitivity ratio," or just "isr") of 1 unit for each 100 by which your glucose exceeds 100.   Please come back for a follow-up appointment next week.  Please see Vaughan Basta the same time, to see if you can get a [employer] continuous glucose monitor systen, that talks to your pump.

## 2020-10-08 NOTE — Telephone Encounter (Signed)
Transition Care Management Unsuccessful Follow-up Telephone Call   Date of discharge and from where:  10/06/2020, [facility]    Attempts:  2nd Attempt   Reason for unsuccessful TCM follow-up call:  Left voice message on # [phone].

## 2020-10-08 NOTE — Progress Notes (Signed)
Subjective:    [name], female    DOB: 1970/08/13, 50 y.o.   MRN: [id number]  HPI Pt returns for f/u of diabetes mellitus:  DM type: 1  Dx'ed: [id number] Complications: CAD.   Therapy: insulin since dx.  DKA: twice (2020 and 2021) Severe hypoglycemia: never.  Pancreatitis: never Other: she has been on pump (medtronic 670) rx since 2014; she takes FL continuous glucose monitor, rather then medtronic, due to cost.   SDOH:  She did not see GI, due to no ins at the time; she has medicaid; she teaches part time.   Interval history: She now takes these pump settings: basal rate of 0.7 units/hr, 6 AM-10 AM, 0.8 units/hr, 10 AM-10 PM, and 0.625 units/hr overnight (when not in "auto mode").  bolus of 1 unit/16 grams CHO. correction bolus (which some people call "sensitivity," or "insulin sensitivity ratio," or just "isr") of 1 unit for each 60 by which your glucose exceeds 100.   I reviewed continuous glucose monitor data.  Glucose varies from 40-400, but most are in the 100's.  It is in general higher as the day goes on, but not necessarily so.   Past Medical History:  Diagnosis Date   Anemia    Anxiety    Coronary artery disease    a. s/p PTCA DES x3 in the LAD (ostial DES placed, mid DES placed, distal DES placed) and PTCA/DES x1 to intermediate branch 02/2017. b. ACS 02/08/18 with LCx stent placement.   Depression    Diabetes mellitus    type 1, on insulin pump   DKA (diabetic ketoacidoses) 12/31/2017   Elevated platelet count    High cholesterol    Hypothyroidism    Ischemic cardiomyopathy    a. EF low-normal with basal inferior akinesis, basal septal hypokinesis by echo 01/2018.    Past Surgical History:  Procedure Laterality Date   APPENDECTOMY     CORONARY ARTERY BYPASS GRAFT N/A 11/04/2018   Procedure: CORONARY ARTERY BYPASS GRAFTING (CABG), ON PUMP, TIMES THREE, USING LEFT AND RIGHT INTERNAL MAMMARY ARTERIES;  Surgeon: Wonda Olds, MD;  Location: [place];   Service: Open Heart Surgery;  Laterality: N/A;   CORONARY STENT INTERVENTION N/A 03/20/2017   Procedure: DES x 3 LAD, DES OM; Surgeon: Burnell Blanks, MD;  Location: [facility] [facility];  Service: Cardiovascular;  Laterality: N/A;   CORONARY/GRAFT ACUTE MI REVASCULARIZATION N/A 02/08/2018   Procedure: Coronary/Graft Acute MI Revascularization;  Surgeon: Belva Crome, MD;  Location: [facility] [facility];  Service: Cardiovascular;  Laterality: N/A;   LEFT HEART CATH AND CORONARY ANGIOGRAPHY N/A 03/20/2017   Procedure: LEFT HEART CATH AND CORONARY ANGIOGRAPHY;  Surgeon: Burnell Blanks, MD;  Location: [facility];  Service: Cardiovascular;  Laterality: N/A;   LEFT HEART CATH AND CORONARY ANGIOGRAPHY N/A 02/08/2018   Procedure: LEFT HEART CATH AND CORONARY ANGIOGRAPHY;  Surgeon: Belva Crome, MD;  Location: [facility] [facility];  Service: Cardiovascular;  Laterality: N/A;   LEFT HEART CATH AND CORONARY ANGIOGRAPHY N/A 10/29/2018   Procedure: LEFT HEART CATH AND CORONARY ANGIOGRAPHY;  Surgeon: Burnell Blanks, MD;  Location: [place] [facility];  Service: Cardiovascular;  Laterality: N/A;   LEFT HEART CATH AND CORS/GRAFTS ANGIOGRAPHY N/A 10/04/2020   Procedure: LEFT HEART CATH AND CORS/GRAFTS ANGIOGRAPHY;  Surgeon: Sherren Mocha, MD;  Location: [place] [facility];  Service: Cardiovascular;  Laterality: N/A;   TEE WITHOUT CARDIOVERSION N/A 11/04/2018   Procedure: TRANSESOPHAGEAL  ECHOCARDIOGRAM (TEE);  Surgeon: Wonda Olds, MD;  Location: [place];  Service: Open Heart Surgery;  Laterality: N/A;    Social History   Socioeconomic History   Marital status: Divorced    Spouse name: Not on file   Number of children: Not on file   Years of education: Not on file   Highest education level: Professional school degree (e.g., MD, DDS, DVM, [place])  Occupational History   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"   Occupation: adjunct facilty at [employer]    Comment: Chemistry  professor  Tobacco Use   Smoking status: Never   Smokeless tobacco: Never   Tobacco comments:    Use marijuana  Vaping Use   Vaping Use: Never used  Substance and Sexual Activity   Alcohol use: No    Alcohol/week: 0.0 standard drinks   Drug use: Not Currently    Types: Marijuana    Comment: last use last week, occaisonal marijuana use   Sexual activity: Not Currently    Birth control/protection: None  Other Topics Concern   Not on file  Social History Narrative   Not on file   Social Determinants of Health   Financial Resource Strain: High Risk   Difficulty of Paying Living Expenses: Very hard  Food Insecurity: Food Insecurity Present   Worried About Running Out of Food in the Last Year: Sometimes true   Ran Out of Food in the Last Year: Sometimes true  Transportation Needs: No Transportation Needs   Lack of Transportation (Medical): No   Lack of Transportation (Non-Medical): No  Physical Activity: Not on file  Stress: Not on file  Social Connections: Not on file  Intimate Partner Violence: Not on file    Current Outpatient Medications on File Prior to Visit  Medication Sig Dispense Refill   acetaminophen (TYLENOL) 325 MG tablet Take 650 mg by mouth daily as needed (couch/pain/fever).     albuterol (VENTOLIN HFA) 108 (90 Base) MCG/ACT inhaler INHALE 2 PUFFS INTO THE LUNGS EVERY 6 (SIX) HOURS AS NEEDED FOR WHEEZING OR SHORTNESS OF BREATH. 8.5 g 2   aspirin 81 MG EC tablet Take 1 tablet (81 mg total) by mouth daily. (Patient taking differently: Take 81 mg by mouth daily as needed for pain or fever.) 90 tablet 0   atorvastatin (LIPITOR) 20 MG tablet Take 1 tablet (20 mg total) by mouth daily. 30 tablet 0   b complex vitamins tablet Take 1 tablet by mouth daily.     budesonide (PULMICORT) 0.25 MG/2ML nebulizer solution Take 2 mLs (0.25 mg total) by nebulization 2 (two) times daily. 60 mL 12   clopidogrel (PLAVIX) 75 MG tablet TAKE 1 TABLET BY MOUTH DAILY WITH BREAKFAST. 30  tablet 10   diclofenac Sodium (VOLTAREN) 1 % GEL Apply 2 g topically 4 (four) times daily. (Patient taking differently: Apply 2 g topically daily as needed (For pain).) 200 g 3   FLUoxetine (PROZAC) 40 MG capsule TAKE 1 CAPSULE BY MOUTH DAILY. 90 capsule 1   fluticasone (FLONASE) 50 MCG/ACT nasal spray Place 1 spray into both nostrils at bedtime as needed for allergies.     hydrOXYzine (ATARAX/VISTARIL) 25 MG tablet Take 1 tablet (25 mg total) by mouth 3 (three) times daily as needed. (Patient taking differently: Take 25 mg by mouth 3 (three) times daily as needed for anxiety.) 60 tablet 1   Insulin Human (INSULIN PUMP) SOLN Inject into the skin continuous. Humalog     insulin lispro (HUMALOG) 100  UNIT/ML injection Inject 35 units into the skin daily via pump 10 mL 1   ipratropium (ATROVENT) 0.06 % nasal spray Place 2 sprays into both nostrils 2 (two) times daily as needed for rhinitis.     levothyroxine (SYNTHROID) 100 MCG tablet TAKE 1 TABLET (100 MCG TOTAL) BY MOUTH DAILY BEFORE BREAKFAST. (Patient taking differently: Take 100 mcg by mouth daily before breakfast.) 90 tablet 3   losartan (COZAAR) 25 MG tablet Take 0.5 tablets (12.5 mg total) by mouth daily. 30 tablet 0   metoprolol succinate (TOPROL-XL) 25 MG 24 hr tablet Take 0.5 tablets (12.5 mg total) by mouth daily. 15 tablet 0   montelukast (SINGULAIR) 10 MG tablet TAKE 1 TABLET (10 MG TOTAL) BY MOUTH AT BEDTIME. (Patient taking differently: Take 10 mg by mouth at bedtime.) 30 tablet 0   nitroGLYCERIN (NITROSTAT) 0.4 MG SL tablet Place 1 tablet (0.4 mg total) under the tongue every 5 (five) minutes as needed for chest pain. 30 tablet 0   ondansetron (ZOFRAN-ODT) 4 MG disintegrating tablet TAKE 1 TABLET (4 MG TOTAL) BY MOUTH EVERY 8 (EIGHT) HOURS AS NEEDED FOR NAUSEA OR VOMITING. (Patient taking differently: Take 4 mg by mouth every 8 (eight) hours as needed for refractory nausea / vomiting.) 20 tablet 1   spironolactone (ALDACTONE) 25 MG tablet  Take 0.5 tablets (12.5 mg total) by mouth daily. 15 tablet 0   sucralfate (CARAFATE) 1 GM/10ML suspension Take 10 mLs (1 g total) by mouth 4 (four) times daily -  with meals and at bedtime. 420 mL 0   metoCLOPramide (REGLAN) 5 MG tablet Take 1 tablet (5 mg total) by mouth every 12 (twelve) hours as needed for nausea or vomiting. 90 tablet 1   pantoprazole (PROTONIX) 40 MG tablet Take 1 tablet (40 mg total) by mouth 2 (two) times daily as needed (acid reflux). (Patient taking differently: Take 40 mg by mouth as needed. Pt taking as needed for indigestion/acid reflux.) 60 tablet 3   No current facility-administered medications on file prior to visit.    Allergies  Allergen Reactions   Atorvastatin Other (See Comments)    Myalgias on '80mg'$  dose, tolerates '20mg'$  ok   Crestor [Rosuvastatin Calcium] Other (See Comments)    Severe myalgias and joint pain. Has tolerated atorvastatin though   Monosodium Glutamate Nausea And Vomiting and Other (See Comments)    migraine   Peanut-Containing Drug Products Other (See Comments)    Vomiting, upset stomach and some wheezing   Zetia [Ezetimibe] Other (See Comments)    myalgias   Ciprofloxin Hcl [Ciprofloxacin] Nausea And Vomiting and Other (See Comments)    Severe migraine   Food Color Red [Red Dye] Diarrhea    Family History  Problem Relation Age of Onset   Cancer Mother        T cell lymphoma   Coronary artery disease Father    Congestive Heart Failure Father    Asthma Father     BP 110/80 (BP Location: Right Arm, Patient Position: Sitting, Cuff Size: Normal)   Pulse (!) 114   Ht '5\' 5"'$  (1.651 m)   Wt 132 lb (59.9 kg)   SpO2 98%   BMI 21.97 kg/m       TDD is 20 units (40% bolus). She takes 1.6 boluses per day.   Pt says hypoglycemia happens only after a correction bolus.  Glucose is in general higher as the day goes on.   She was recently in the hospital for DKA.  She is  back on pump now.  Since hosp d/c, she denies hypoglycemia.  She  says cbg varies from 72-200.  Past Medical History:  Diagnosis Date   Anemia    Anxiety    Coronary artery disease    a. s/p PTCA DES x3 in the LAD (ostial DES placed, mid DES placed, distal DES placed) and PTCA/DES x1 to intermediate branch 02/2017. b. ACS 02/08/18 with LCx stent placement.   Depression    Diabetes mellitus    type 1, on insulin pump   DKA (diabetic ketoacidoses) 12/31/2017   Elevated platelet count    High cholesterol    Hypothyroidism    Ischemic cardiomyopathy    a. EF low-normal with basal inferior akinesis, basal septal hypokinesis by echo 01/2018.    Past Surgical History:  Procedure Laterality Date   APPENDECTOMY     CORONARY ARTERY BYPASS GRAFT N/A 11/04/2018   Procedure: CORONARY ARTERY BYPASS GRAFTING (CABG), ON PUMP, TIMES THREE, USING LEFT AND RIGHT INTERNAL MAMMARY ARTERIES;  Surgeon: Wonda Olds, MD;  Location: [place];  Service: Open Heart Surgery;  Laterality: N/A;   CORONARY STENT INTERVENTION N/A 03/20/2017   Procedure: DES x 3 LAD, DES OM; Surgeon: Burnell Blanks, MD;  Location: [facility];  Service: Cardiovascular;  Laterality: N/A;   CORONARY/GRAFT ACUTE MI REVASCULARIZATION N/A 02/08/2018   Procedure: Coronary/Graft Acute MI Revascularization;  Surgeon: Belva Crome, MD;  Location: [facility];  Service: Cardiovascular;  Laterality: N/A;   LEFT HEART CATH AND CORONARY ANGIOGRAPHY N/A 03/20/2017   Procedure: LEFT HEART CATH AND CORONARY ANGIOGRAPHY;  Surgeon: Burnell Blanks, MD;  Location: [facility] [facility];  Service: Cardiovascular;  Laterality: N/A;   LEFT HEART CATH AND CORONARY ANGIOGRAPHY N/A 02/08/2018   Procedure: LEFT HEART CATH AND CORONARY ANGIOGRAPHY;  Surgeon: Belva Crome, MD;  Location: [facility];  Service: Cardiovascular;  Laterality: N/A;   LEFT HEART CATH AND CORONARY ANGIOGRAPHY N/A 10/29/2018   Procedure: LEFT HEART CATH AND CORONARY ANGIOGRAPHY;  Surgeon: Burnell Blanks, MD;   Location: [place] [facility];  Service: Cardiovascular;  Laterality: N/A;   LEFT HEART CATH AND CORS/GRAFTS ANGIOGRAPHY N/A 10/04/2020   Procedure: LEFT HEART CATH AND CORS/GRAFTS ANGIOGRAPHY;  Surgeon: Sherren Mocha, MD;  Location: [facility] [facility];  Service: Cardiovascular;  Laterality: N/A;   TEE WITHOUT CARDIOVERSION N/A 11/04/2018   Procedure: TRANSESOPHAGEAL ECHOCARDIOGRAM (TEE);  Surgeon: Wonda Olds, MD;  Location: [place];  Service: Open Heart Surgery;  Laterality: N/A;    Social History   Socioeconomic History   Marital status: Divorced    Spouse name: Not on file   Number of children: Not on file   Years of education: Not on file   Highest education level: Professional school degree (e.g., MD, DDS, DVM, [place])  Occupational History   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"   Occupation: adjunct facilty at [employer]    Comment: Chemistry professor  Tobacco Use   Smoking status: Never   Smokeless tobacco: Never   Tobacco comments:    Use marijuana  Vaping Use   Vaping Use: Never used  Substance and Sexual Activity   Alcohol use: No    Alcohol/week: 0.0 standard drinks   Drug use: Not Currently    Types: Marijuana    Comment: last use last week, occaisonal marijuana use   Sexual activity: Not Currently    Birth control/protection: None  Other Topics Concern  Not on file  Social History Narrative   Not on file   Social Determinants of Health   Financial Resource Strain: High Risk   Difficulty of Paying Living Expenses: Very hard  Food Insecurity: Food Insecurity Present   Worried About Charity fundraiser in the Last Year: Sometimes true   Ran Out of Food in the Last Year: Sometimes true  Transportation Needs: No Transportation Needs   Lack of Transportation (Medical): No   Lack of Transportation (Non-Medical): No  Physical Activity: Not on file  Stress: Not on file  Social Connections: Not on file  Intimate Partner Violence: Not on file    Current  Outpatient Medications on File Prior to Visit  Medication Sig Dispense Refill   acetaminophen (TYLENOL) 325 MG tablet Take 650 mg by mouth daily as needed (couch/pain/fever).     albuterol (VENTOLIN HFA) 108 (90 Base) MCG/ACT inhaler INHALE 2 PUFFS INTO THE LUNGS EVERY 6 (SIX) HOURS AS NEEDED FOR WHEEZING OR SHORTNESS OF BREATH. 8.5 g 2   aspirin 81 MG EC tablet Take 1 tablet (81 mg total) by mouth daily. (Patient taking differently: Take 81 mg by mouth daily as needed for pain or fever.) 90 tablet 0   atorvastatin (LIPITOR) 20 MG tablet Take 1 tablet (20 mg total) by mouth daily. 30 tablet 0   b complex vitamins tablet Take 1 tablet by mouth daily.     budesonide (PULMICORT) 0.25 MG/2ML nebulizer solution Take 2 mLs (0.25 mg total) by nebulization 2 (two) times daily. 60 mL 12   clopidogrel (PLAVIX) 75 MG tablet TAKE 1 TABLET BY MOUTH DAILY WITH BREAKFAST. 30 tablet 10   diclofenac Sodium (VOLTAREN) 1 % GEL Apply 2 g topically 4 (four) times daily. (Patient taking differently: Apply 2 g topically daily as needed (For pain).) 200 g 3   FLUoxetine (PROZAC) 40 MG capsule TAKE 1 CAPSULE BY MOUTH DAILY. 90 capsule 1   fluticasone (FLONASE) 50 MCG/ACT nasal spray Place 1 spray into both nostrils at bedtime as needed for allergies.     hydrOXYzine (ATARAX/VISTARIL) 25 MG tablet Take 1 tablet (25 mg total) by mouth 3 (three) times daily as needed. (Patient taking differently: Take 25 mg by mouth 3 (three) times daily as needed for anxiety.) 60 tablet 1   Insulin Human (INSULIN PUMP) SOLN Inject into the skin continuous. Humalog     insulin lispro (HUMALOG) 100 UNIT/ML injection Inject 35 units into the skin daily via pump 10 mL 1   ipratropium (ATROVENT) 0.06 % nasal spray Place 2 sprays into both nostrils 2 (two) times daily as needed for rhinitis.     levothyroxine (SYNTHROID) 100 MCG tablet TAKE 1 TABLET (100 MCG TOTAL) BY MOUTH DAILY BEFORE BREAKFAST. (Patient taking differently: Take 100 mcg by mouth  daily before breakfast.) 90 tablet 3   losartan (COZAAR) 25 MG tablet Take 0.5 tablets (12.5 mg total) by mouth daily. 30 tablet 0   metoprolol succinate (TOPROL-XL) 25 MG 24 hr tablet Take 0.5 tablets (12.5 mg total) by mouth daily. 15 tablet 0   montelukast (SINGULAIR) 10 MG tablet TAKE 1 TABLET (10 MG TOTAL) BY MOUTH AT BEDTIME. (Patient taking differently: Take 10 mg by mouth at bedtime.) 30 tablet 0   nitroGLYCERIN (NITROSTAT) 0.4 MG SL tablet Place 1 tablet (0.4 mg total) under the tongue every 5 (five) minutes as needed for chest pain. 30 tablet 0   ondansetron (ZOFRAN-ODT) 4 MG disintegrating tablet TAKE 1 TABLET (4 MG TOTAL) BY  MOUTH EVERY 8 (EIGHT) HOURS AS NEEDED FOR NAUSEA OR VOMITING. (Patient taking differently: Take 4 mg by mouth every 8 (eight) hours as needed for refractory nausea / vomiting.) 20 tablet 1   spironolactone (ALDACTONE) 25 MG tablet Take 0.5 tablets (12.5 mg total) by mouth daily. 15 tablet 0   sucralfate (CARAFATE) 1 GM/10ML suspension Take 10 mLs (1 g total) by mouth 4 (four) times daily -  with meals and at bedtime. 420 mL 0   metoCLOPramide (REGLAN) 5 MG tablet Take 1 tablet (5 mg total) by mouth every 12 (twelve) hours as needed for nausea or vomiting. 90 tablet 1   pantoprazole (PROTONIX) 40 MG tablet Take 1 tablet (40 mg total) by mouth 2 (two) times daily as needed (acid reflux). (Patient taking differently: Take 40 mg by mouth as needed. Pt taking as needed for indigestion/acid reflux.) 60 tablet 3   No current facility-administered medications on file prior to visit.    Allergies  Allergen Reactions   Atorvastatin Other (See Comments)    Myalgias on '80mg'$  dose, tolerates '20mg'$  ok   Crestor [Rosuvastatin Calcium] Other (See Comments)    Severe myalgias and joint pain. Has tolerated atorvastatin though   Monosodium Glutamate Nausea And Vomiting and Other (See Comments)    migraine   Peanut-Containing Drug Products Other (See Comments)    Vomiting, upset  stomach and some wheezing   Zetia [Ezetimibe] Other (See Comments)    myalgias   Ciprofloxin Hcl [Ciprofloxacin] Nausea And Vomiting and Other (See Comments)    Severe migraine   Food Color Red [Red Dye] Diarrhea    Family History  Problem Relation Age of Onset   Cancer Mother        T cell lymphoma   Coronary artery disease Father    Congestive Heart Failure Father    Asthma Father     BP 110/80 (BP Location: Right Arm, Patient Position: Sitting, Cuff Size: Normal)   Pulse (!) 114   Ht '5\' 5"'$  (1.651 m)   Wt 132 lb (59.9 kg)   SpO2 98%   BMI 21.97 kg/m    Review of Systems She has lost weight.      Objective:   Physical Exam VITAL SIGNS:  See vs page GENERAL: no distress Pulses: dorsalis pedis intact bilat.   MSK: no deformity of the feet CV: no leg edema Skin:  no ulcer on the feet.  normal color and temp on the feet. Neuro: sensation is intact to touch on the feet.     Lab Results  Component Value Date   HGBA1C 8.5 (H) 10/01/2020      Assessment & Plan:  Type 1 DM: uncontrolled.   Hypoglycemia, due to insulin: She should change to medtronic continuous glucose monitor  Patient Instructions  Please continue these pump settings: basal rate of 0.7 units/hr, 6 AM-10 AM, 0.8 units/hr, 10 AM-10 PM, and 0.625 units/hr overnight (when not in "auto mode").  bolus of 1 unit/16 grams carbohydrate.  correction bolus (which some people call "sensitivity," or "insulin sensitivity ratio," or just "isr") of 1 unit for each 100 by which your glucose exceeds 100.   Please come back for a follow-up appointment next week.  Please see Vaughan Basta the same time, to see if you can get a Medtronic continuous glucose monitor systen, that talks to your pump.

## 2020-10-11 ENCOUNTER — Encounter: Payer: Self-pay | Admitting: Endocrinology

## 2020-10-11 ENCOUNTER — Telehealth: Payer: Self-pay

## 2020-10-11 ENCOUNTER — Ambulatory Visit: Payer: Medicaid Other | Admitting: Endocrinology

## 2020-10-11 ENCOUNTER — Ambulatory Visit (INDEPENDENT_AMBULATORY_CARE_PROVIDER_SITE_OTHER): Payer: Medicaid Other | Admitting: Endocrinology

## 2020-10-11 ENCOUNTER — Other Ambulatory Visit: Payer: Self-pay

## 2020-10-11 VITALS — BP 80/50 | HR 76 | Ht 65.0 in | Wt 131.6 lb

## 2020-10-11 DIAGNOSIS — E1059 Type 1 diabetes mellitus with other circulatory complications: Secondary | ICD-10-CM

## 2020-10-11 LAB — POCT GLYCOSYLATED HEMOGLOBIN (HGB A1C): Hemoglobin A1C: 8.5 % — AB (ref 4.0–5.6)

## 2020-10-11 NOTE — Patient Instructions (Addendum)
Please stop taking the losartan.  Also please skip 1 day of metoprolol, then resume.   Please continue these pump settings: basal rate of 0.85 units/hr, 6 AM-10 AM, 0.975 units/hr, 10 AM-10 PM, 0.75 units/hr 10PM-12MN, and 0.625 overnight.  bolus of 1 unit/16 grams carbohydrate.  correction bolus (which some people call "sensitivity," or "insulin sensitivity ratio," or just "isr") of 1 unit for each 100 by which your glucose exceeds 100.  Please see Jennifer Hogan as scheduled, to see if you can get a Medtronic continuous glucose monitor systen, that talks to your pump.    Please come back for a follow-up appointment in 2 months.

## 2020-10-11 NOTE — Telephone Encounter (Signed)
Transition Care Management Unsuccessful Follow-up Telephone Call   Date of discharge and from where:  10/06/2020, [facility]    Attempts:  3rd Attempt   Reason for unsuccessful TCM follow-up call:  Left voice message on # [phone].

## 2020-10-11 NOTE — Progress Notes (Signed)
Subjective:    [name], female    DOB: 1970-04-02, 50 y.o.   MRN: [id number]  HPI t returns for f/u of diabetes mellitus:  DM type: 1  Dx'ed: 4782 Complications: CAD.   Therapy: insulin since dx.  DKA: twice (2020 and 2021) Severe hypoglycemia: never.  Pancreatitis: never Other: she has been on pump (medtronic 670) rx since 2014; she takes FL continuous glucose monitor, rather then medtronic, due to cost.   SDOH:  She did not see GI, due to no ins at the time; she has medicaid; she teaches part time.   Interval history: She now takes these pump settings: basal rate of 0.85 units/hr, 6 AM-10 AM, 0.975 units/hr, 10 AM-10 PM, 0.75 units/hr 10PM-12MN, and 0.625 overnight.  bolus of 1 unit/16 grams carbohydrate.  correction bolus (which some people call "sensitivity," or "insulin sensitivity ratio," or just "isr") of 1 unit for each 100 by which your glucose exceeds 100.   She has increased basal rate, due to hyperglycemia.   TDD is 22 units ((61% basal) She takes 1.9 boluses per day I reviewed continuous glucose monitor, but data are minimal.  She will see CDE in 2 weeks.  She has had only 1 episode of hypoglycemia since she was here last week.    Past Medical History:  Diagnosis Date   Anemia    Anxiety    Coronary artery disease    a. s/p PTCA DES x3 in the LAD (ostial DES placed, mid DES placed, distal DES placed) and PTCA/DES x1 to intermediate branch 02/2017. b. ACS 02/08/18 with LCx stent placement.   Depression    Diabetes mellitus    type 1, on insulin pump   DKA (diabetic ketoacidoses) 12/31/2017   Elevated platelet count    High cholesterol    Hypothyroidism    Ischemic cardiomyopathy    a. EF low-normal with basal inferior akinesis, basal septal hypokinesis by echo 01/2018.    Past Surgical History:  Procedure Laterality Date   APPENDECTOMY     CORONARY ARTERY BYPASS GRAFT N/A 11/04/2018   Procedure: CORONARY ARTERY BYPASS GRAFTING (CABG), ON  PUMP, TIMES THREE, USING LEFT AND RIGHT INTERNAL MAMMARY ARTERIES;  Surgeon: Wonda Olds, MD;  Location: [place];  Service: Open Heart Surgery;  Laterality: N/A;   CORONARY STENT INTERVENTION N/A 03/20/2017   Procedure: DES x 3 LAD, DES OM; Surgeon: Burnell Blanks, MD;  Location: [place] [facility];  Service: Cardiovascular;  Laterality: N/A;   CORONARY/GRAFT ACUTE MI REVASCULARIZATION N/A 02/08/2018   Procedure: Coronary/Graft Acute MI Revascularization;  Surgeon: Belva Crome, MD;  Location: [place] [facility];  Service: Cardiovascular;  Laterality: N/A;   LEFT HEART CATH AND CORONARY ANGIOGRAPHY N/A 03/20/2017   Procedure: LEFT HEART CATH AND CORONARY ANGIOGRAPHY;  Surgeon: Burnell Blanks, MD;  Location: [place] [facility];  Service: Cardiovascular;  Laterality: N/A;   LEFT HEART CATH AND CORONARY ANGIOGRAPHY N/A 02/08/2018   Procedure: LEFT HEART CATH AND CORONARY ANGIOGRAPHY;  Surgeon: Belva Crome, MD;  Location: [place] [facility];  Service: Cardiovascular;  Laterality: N/A;   LEFT HEART CATH AND CORONARY ANGIOGRAPHY N/A 10/29/2018   Procedure: LEFT HEART CATH AND CORONARY ANGIOGRAPHY;  Surgeon: Burnell Blanks, MD;  Location: [place] [facility];  Service: Cardiovascular;  Laterality: N/A;   LEFT HEART CATH AND CORS/GRAFTS ANGIOGRAPHY N/A 10/04/2020   Procedure: LEFT HEART CATH AND CORS/GRAFTS ANGIOGRAPHY;  Surgeon: Sherren Mocha, MD;  Location:  [place] [facility];  Service: Cardiovascular;  Laterality: N/A;   TEE WITHOUT CARDIOVERSION N/A 11/04/2018   Procedure: TRANSESOPHAGEAL ECHOCARDIOGRAM (TEE);  Surgeon: Wonda Olds, MD;  Location: [place];  Service: Open Heart Surgery;  Laterality: N/A;    Social History   Socioeconomic History   Marital status: Divorced    Spouse name: Not on file   Number of children: Not on file   Years of education: Not on file   Highest education level: Professional school degree (e.g., MD, DDS, DVM, [place])  Occupational  History   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"   Occupation: adjunct facilty at [employer]    Comment: Chemistry professor  Tobacco Use   Smoking status: Never   Smokeless tobacco: Never   Tobacco comments:    Use marijuana  Vaping Use   Vaping Use: Never used  Substance and Sexual Activity   Alcohol use: No    Alcohol/week: 0.0 standard drinks   Drug use: Not Currently    Types: Marijuana    Comment: last use last week, occaisonal marijuana use   Sexual activity: Not Currently    Birth control/protection: None  Other Topics Concern   Not on file  Social History Narrative   Not on file   Social Determinants of Health   Financial Resource Strain: High Risk   Difficulty of Paying Living Expenses: Very hard  Food Insecurity: Food Insecurity Present   Worried About Running Out of Food in the Last Year: Sometimes true   Ran Out of Food in the Last Year: Sometimes true  Transportation Needs: No Transportation Needs   Lack of Transportation (Medical): No   Lack of Transportation (Non-Medical): No  Physical Activity: Not on file  Stress: Not on file  Social Connections: Not on file  Intimate Partner Violence: Not on file    Current Outpatient Medications on File Prior to Visit  Medication Sig Dispense Refill   acetaminophen (TYLENOL) 325 MG tablet Take 650 mg by mouth daily as needed (couch/pain/fever).     albuterol (VENTOLIN HFA) 108 (90 Base) MCG/ACT inhaler INHALE 2 PUFFS INTO THE LUNGS EVERY 6 (SIX) HOURS AS NEEDED FOR WHEEZING OR SHORTNESS OF BREATH. 8.5 g 2   aspirin 81 MG EC tablet Take 1 tablet (81 mg total) by mouth daily. (Patient taking differently: Take 81 mg by mouth daily as needed for pain or fever.) 90 tablet 0   atorvastatin (LIPITOR) 20 MG tablet Take 1 tablet (20 mg total) by mouth daily. 30 tablet 0   b complex vitamins tablet Take 1 tablet by mouth daily.     budesonide (PULMICORT) 0.25 MG/2ML nebulizer solution Take 2 mLs (0.25 mg total) by nebulization 2  (two) times daily. 60 mL 12   clopidogrel (PLAVIX) 75 MG tablet TAKE 1 TABLET BY MOUTH DAILY WITH BREAKFAST. 30 tablet 10   diclofenac Sodium (VOLTAREN) 1 % GEL Apply 2 g topically 4 (four) times daily. (Patient taking differently: Apply 2 g topically daily as needed (For pain).) 200 g 3   FLUoxetine (PROZAC) 40 MG capsule TAKE 1 CAPSULE BY MOUTH DAILY. 90 capsule 1   fluticasone (FLONASE) 50 MCG/ACT nasal spray Place 1 spray into both nostrils at bedtime as needed for allergies.     hydrOXYzine (ATARAX/VISTARIL) 25 MG tablet Take 1 tablet (25 mg total) by mouth 3 (three) times daily as needed. (Patient taking differently: Take 25 mg by mouth 3 (three) times daily as needed for anxiety.) 60 tablet 1  Insulin Human (INSULIN PUMP) SOLN Inject into the skin continuous. Humalog     insulin lispro (HUMALOG) 100 UNIT/ML injection Inject 35 units into the skin daily via pump 10 mL 1   ipratropium (ATROVENT) 0.06 % nasal spray Place 2 sprays into both nostrils 2 (two) times daily as needed for rhinitis.     levothyroxine (SYNTHROID) 100 MCG tablet TAKE 1 TABLET (100 MCG TOTAL) BY MOUTH DAILY BEFORE BREAKFAST. (Patient taking differently: Take 100 mcg by mouth daily before breakfast.) 90 tablet 3   metoprolol succinate (TOPROL-XL) 25 MG 24 hr tablet Take 0.5 tablets (12.5 mg total) by mouth daily. 15 tablet 0   montelukast (SINGULAIR) 10 MG tablet TAKE 1 TABLET (10 MG TOTAL) BY MOUTH AT BEDTIME. (Patient taking differently: Take 10 mg by mouth at bedtime.) 30 tablet 0   nitroGLYCERIN (NITROSTAT) 0.4 MG SL tablet Place 1 tablet (0.4 mg total) under the tongue every 5 (five) minutes as needed for chest pain. 30 tablet 0   ondansetron (ZOFRAN-ODT) 4 MG disintegrating tablet TAKE 1 TABLET (4 MG TOTAL) BY MOUTH EVERY 8 (EIGHT) HOURS AS NEEDED FOR NAUSEA OR VOMITING. (Patient taking differently: Take 4 mg by mouth every 8 (eight) hours as needed for refractory nausea / vomiting.) 20 tablet 1   spironolactone  (ALDACTONE) 25 MG tablet Take 0.5 tablets (12.5 mg total) by mouth daily. 15 tablet 0   sucralfate (CARAFATE) 1 GM/10ML suspension Take 10 mLs (1 g total) by mouth 4 (four) times daily -  with meals and at bedtime. 420 mL 0   metoCLOPramide (REGLAN) 5 MG tablet Take 1 tablet (5 mg total) by mouth every 12 (twelve) hours as needed for nausea or vomiting. 90 tablet 1   pantoprazole (PROTONIX) 40 MG tablet Take 1 tablet (40 mg total) by mouth 2 (two) times daily as needed (acid reflux). (Patient taking differently: Take 40 mg by mouth as needed. Pt taking as needed for indigestion/acid reflux.) 60 tablet 3   No current facility-administered medications on file prior to visit.    Allergies  Allergen Reactions   Atorvastatin Other (See Comments)    Myalgias on 80mg  dose, tolerates 20mg  ok   Crestor [Rosuvastatin Calcium] Other (See Comments)    Severe myalgias and joint pain. Has tolerated atorvastatin though   Monosodium Glutamate Nausea And Vomiting and Other (See Comments)    migraine   Peanut-Containing Drug Products Other (See Comments)    Vomiting, upset stomach and some wheezing   Zetia [Ezetimibe] Other (See Comments)    myalgias   Ciprofloxin Hcl [Ciprofloxacin] Nausea And Vomiting and Other (See Comments)    Severe migraine   Food Color Red [Red Dye] Diarrhea    Family History  Problem Relation Age of Onset   Cancer Mother        T cell lymphoma   Coronary artery disease Father    Congestive Heart Failure Father    Asthma Father     BP (!) 80/50 (BP Location: Right Arm, Patient Position: Sitting, Cuff Size: Normal)   Pulse 76   Ht 5\' 5"  (1.651 m)   Wt 131 lb 9.6 oz (59.7 kg)   SpO2 98%   BMI 21.90 kg/m    Review of Systems She has lightheadedness, but no LOC.      Objective:   Physical Exam VITAL SIGNS:  See vs page GENERAL: no distress Gait: normal and steady.       Assessment & Plan:  Type 1 DM: uncontrolled.  Next  step is to get a medtronic continuous  glucose monitor.  HTN: overcontrolled  Patient Instructions  Please stop taking the losartan.  Also please skip 1 day of metoprolol, then resume.   Please continue these pump settings: basal rate of 0.85 units/hr, 6 AM-10 AM, 0.975 units/hr, 10 AM-10 PM, 0.75 units/hr 10PM-12MN, and 0.625 overnight.  bolus of 1 unit/16 grams carbohydrate.  correction bolus (which some people call "sensitivity," or "insulin sensitivity ratio," or just "isr") of 1 unit for each 100 by which your glucose exceeds 100.  Please see Vaughan Basta as scheduled, to see if you can get a Medtronic continuous glucose monitor systen, that talks to your pump.    Please come back for a follow-up appointment in 2 months.

## 2020-10-13 ENCOUNTER — Telehealth (HOSPITAL_COMMUNITY): Payer: Self-pay

## 2020-10-13 NOTE — Telephone Encounter (Signed)
Call attempted to confirm HV TOC appt 9/22 @ 11AM. HIPPA appropriate VM left with callback number.   Pricilla Holm, MSN, RN Heart Failure Nurse Navigator [phone]

## 2020-10-13 NOTE — Progress Notes (Signed)
HEART & VASCULAR TRANSITION OF CARE CONSULT NOTE     Referring Physician: Primary Care: Malachi Pro NP  Primary Cardiologist: Dr Radford Pax Endocrinology: Dr Loanne Drilling   HPI: Referred to clinic by Dr Radford Pax for heart failure consultation.   Ms Jennifer Hogan is a 50 year old with a history of recenlty diagnosed HFrEF, CAD, status post coronary artery bypass and graft (LIMA to the LAD/ramus intermedius and RIMA to the obtuse marginal), DM Type I,  anemia, anxiety, hypothyroidism, and hyperlipidemia.   Admitted 10/01/2020 with DKA and NSTEMI.  Hospital course complicated by acute respiratory failure and pulmonary edema. Had ECHO with reduced EF down to 20-25%. LHC showed severe CAD with total occlusion of the proximal LAD, moderate stenosis of the intermediate branch, and severe ostial circumflex in-stent restenosis. Diuresed with IV lasix. Discharged on Toprol Xl and spironolactone. Discharged to home on 10/06/2020.   Complaining of fatigue. SOB with brisk walking. Leans on the chart in the grocery store. SOB carrying groceries. Denies PND/Orthopnea. Lightheaded today. No chest pain. Appetite ok. No fever or chills. She has not been weighing at home. Taking all medications.  Lives alone. 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    Cardiac Testing  Echo 09/2020 EF 20-25%  Echo 11/2018 EF 55-60%   Cath 09/2020   Mid LAD lesion is 60% stenosed.   Ost Cx to Prox Cx lesion is 99% stenosed.   Prox Cx to Mid Cx lesion is 70% stenosed.   Ramus-1 lesion is 60% stenosed.   Ramus-2 lesion is 20% stenosed.   Ramus-3 lesion is 70% stenosed.   Prox LAD to Mid LAD lesion is 100% stenosed.   Dist LAD-1 lesion is 70% stenosed.   Dist LAD-2 lesion is 70% stenosed.   Non-stenotic Mid LAD to Dist LAD lesion was previously treated.   RIMA graft was visualized by angiography and is small.   LIMA and is large.   The graft exhibits no disease.   LV end diastolic pressure is normal.  Severe native vessel  CAD with total occlusion of the proximal LAD, moderate stenosis of the intermediate branch, and severe ostial circumflex in-stent restenosis S/P CABG with patency of the both the RIMA-circumflex and LAD sequential to the intermediate    Review of Systems: [y] = yes, [ ]  = no   General: Weight gain [ ] ; Weight loss [ ] ; Anorexia [ ] ; Fatigue [ Y]; Fever [ ] ; Chills [ ] ; Weakness [Y ]  Cardiac: Chest pain/pressure [ ] ; Resting SOB [ ] ; Exertional SOB [Y ]; Orthopnea [ ] ; Pedal Edema [ ] ; Palpitations [ ] ; Syncope [ ] ; Presyncope [ ] ; Paroxysmal nocturnal dyspnea[ ]   Pulmonary: Cough [ ] ; Wheezing[ ] ; Hemoptysis[ ] ; Sputum [ ] ; Snoring [ ]   GI: Vomiting[ ] ; Dysphagia[ ] ; Melena[ ] ; Hematochezia [ ] ; Heartburn[ ] ; Abdominal pain [ ] ; Constipation [ ] ; Diarrhea [ ] ; BRBPR [ ]   GU: Hematuria[ ] ; Dysuria [ ] ; Nocturia[ ]   Vascular: Pain in legs with walking [ ] ; Pain in feet with lying flat [ ] ; Non-healing sores [ ] ; Stroke [ ] ; TIA [ ] ; Slurred speech [ ] ;  Neuro: Headaches[ ] ; Vertigo[ ] ; Seizures[ ] ; Paresthesias[ ] ;Blurred vision [ ] ; Diplopia [ ] ; Vision changes [ ]   Ortho/Skin: Arthritis [ ] ; Joint pain [ ] ; Muscle pain [ ] ; Joint swelling [ ] ; Back Pain [ ] ; Rash [ ]   Psych: Depression[ ] ; Anxiety[ Y]  Heme: Bleeding problems [ ] ; Clotting disorders [ ] ;  Anemia [ ]   Endocrine: Diabetes [ Y]; Thyroid dysfunction[ ]    Past Medical History:  Diagnosis Date   Anemia    Anxiety    Coronary artery disease    a. s/p PTCA DES x3 in the LAD (ostial DES placed, mid DES placed, distal DES placed) and PTCA/DES x1 to intermediate branch 02/2017. b. ACS 02/08/18 with LCx stent placement.   Depression    Diabetes mellitus    type 1, on insulin pump   DKA (diabetic ketoacidoses) 12/31/2017   Elevated platelet count    High cholesterol    Hypothyroidism    Ischemic cardiomyopathy    a. EF low-normal with basal inferior akinesis, basal septal hypokinesis by echo 01/2018.    Current Outpatient  Medications  Medication Sig Dispense Refill   acetaminophen (TYLENOL) 325 MG tablet Take 650 mg by mouth daily as needed (couch/pain/fever).     albuterol (VENTOLIN HFA) 108 (90 Base) MCG/ACT inhaler INHALE 2 PUFFS INTO THE LUNGS EVERY 6 (SIX) HOURS AS NEEDED FOR WHEEZING OR SHORTNESS OF BREATH. 8.5 g 2   aspirin 81 MG EC tablet Take 1 tablet (81 mg total) by mouth daily. (Patient taking differently: Take 81 mg by mouth daily as needed for pain or fever.) 90 tablet 0   atorvastatin (LIPITOR) 20 MG tablet Take 1 tablet (20 mg total) by mouth daily. (Patient taking differently: Take 20 mg by mouth daily. Patient takes 1/2 a tablet daily.) 30 tablet 0   b complex vitamins tablet Take 1 tablet by mouth daily.     budesonide (PULMICORT) 0.25 MG/2ML nebulizer solution Take 2 mLs (0.25 mg total) by nebulization 2 (two) times daily. 60 mL 12   clopidogrel (PLAVIX) 75 MG tablet TAKE 1 TABLET BY MOUTH DAILY WITH BREAKFAST. 30 tablet 10   diclofenac Sodium (VOLTAREN) 1 % GEL Apply 2 g topically 4 (four) times daily. (Patient taking differently: Apply 2 g topically daily as needed (For pain).) 200 g 3   FLUoxetine (PROZAC) 40 MG capsule TAKE 1 CAPSULE BY MOUTH DAILY. 90 capsule 1   fluticasone (FLONASE) 50 MCG/ACT nasal spray Place 1 spray into both nostrils at bedtime as needed for allergies.     hydrOXYzine (ATARAX/VISTARIL) 25 MG tablet Take 1 tablet (25 mg total) by mouth 3 (three) times daily as needed. (Patient taking differently: Take 25 mg by mouth 3 (three) times daily as needed for anxiety.) 60 tablet 1   Insulin Human (INSULIN PUMP) SOLN Inject into the skin continuous. Humalog     insulin lispro (HUMALOG) 100 UNIT/ML injection Inject 35 units into the skin daily via pump 10 mL 1   ipratropium (ATROVENT) 0.06 % nasal spray Place 2 sprays into both nostrils 2 (two) times daily as needed for rhinitis.     levothyroxine (SYNTHROID) 100 MCG tablet TAKE 1 TABLET (100 MCG TOTAL) BY MOUTH DAILY BEFORE  BREAKFAST. 90 tablet 3   metoCLOPramide (REGLAN) 5 MG tablet Take 1 tablet (5 mg total) by mouth every 12 (twelve) hours as needed for nausea or vomiting. 90 tablet 1   metoprolol succinate (TOPROL-XL) 25 MG 24 hr tablet Take 0.5 tablets (12.5 mg total) by mouth daily. 15 tablet 0   montelukast (SINGULAIR) 10 MG tablet TAKE 1 TABLET (10 MG TOTAL) BY MOUTH AT BEDTIME. (Patient taking differently: Take 10 mg by mouth at bedtime.) 30 tablet 0   nitroGLYCERIN (NITROSTAT) 0.4 MG SL tablet Place 1 tablet (0.4 mg total) under the tongue every 5 (five) minutes as needed  for chest pain. 30 tablet 0   ondansetron (ZOFRAN-ODT) 4 MG disintegrating tablet TAKE 1 TABLET (4 MG TOTAL) BY MOUTH EVERY 8 (EIGHT) HOURS AS NEEDED FOR NAUSEA OR VOMITING. (Patient taking differently: Take 4 mg by mouth every 8 (eight) hours as needed for refractory nausea / vomiting.) 20 tablet 1   pantoprazole (PROTONIX) 40 MG tablet Take 1 tablet (40 mg total) by mouth 2 (two) times daily as needed (acid reflux). (Patient taking differently: Take 40 mg by mouth as needed. Pt taking as needed for indigestion/acid reflux.) 60 tablet 3   spironolactone (ALDACTONE) 25 MG tablet Take 0.5 tablets (12.5 mg total) by mouth daily. 15 tablet 0   sucralfate (CARAFATE) 1 GM/10ML suspension Take 10 mLs (1 g total) by mouth 4 (four) times daily -  with meals and at bedtime. 420 mL 0   No current facility-administered medications for this encounter.    Allergies  Allergen Reactions   Atorvastatin Other (See Comments)    Myalgias on 80mg  dose, tolerates 20mg  ok   Crestor [Rosuvastatin Calcium] Other (See Comments)    Severe myalgias and joint pain. Has tolerated atorvastatin though   Monosodium Glutamate Nausea And Vomiting and Other (See Comments)    migraine   Peanut-Containing Drug Products Other (See Comments)    Vomiting, upset stomach and some wheezing   Zetia [Ezetimibe] Other (See Comments)    myalgias   Ciprofloxin Hcl [Ciprofloxacin]  Nausea And Vomiting and Other (See Comments)    Severe migraine   Food Color Red [Red Dye] Diarrhea      Social History   Socioeconomic History   Marital status: Divorced    Spouse name: Not on file   Number of children: Not on file   Years of education: Not on file   Highest education level: Professional school degree (e.g., MD, DDS, DVM, JD)  Occupational History   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"   Occupation: adjunct facilty at [employer]    Comment: Chemistry professor  Tobacco Use   Smoking status: Never   Smokeless tobacco: Never   Tobacco comments:    Use marijuana  Vaping Use   Vaping Use: Never used  Substance and Sexual Activity   Alcohol use: No    Alcohol/week: 0.0 standard drinks   Drug use: Not Currently    Types: Marijuana    Comment: last use last week, occaisonal marijuana use   Sexual activity: Not Currently    Birth control/protection: None  Other Topics Concern   Not on file  Social History Narrative   Not on file   Social Determinants of Health   Financial Resource Strain: High Risk   Difficulty of Paying Living Expenses: Very hard  Food Insecurity: Food Insecurity Present   Worried About Running Out of Food in the Last Year: Sometimes true   Ran Out of Food in the Last Year: Sometimes true  Transportation Needs: No Transportation Needs   Lack of Transportation (Medical): No   Lack of Transportation (Non-Medical): No  Physical Activity: Not on file  Stress: Not on file  Social Connections: Not on file  Intimate Partner Violence: Not on file      Family History  Problem Relation Age of Onset   Cancer Mother        T cell lymphoma   Coronary artery disease Father    Congestive Heart Failure Father    Asthma Father     Vitals:   10/14/20 1123  BP: 102/68  Pulse: 86  SpO2: 99%  Weight: 62.4 kg   Wt Readings from Last 3 Encounters:  10/14/20 62.4 kg  10/11/20 59.7 kg  10/08/20 59.9 kg   PHYSICAL EXAM: General:   No  respiratory difficulty HEENT: normal Neck: supple. no JVD. Carotids 2+ bilat; no bruits. No lymphadenopathy or thryomegaly appreciated. Cor: PMI nondisplaced. Regular rate & rhythm. No rubs, gallops or murmurs. Lungs: clear Abdomen: soft, nontender, nondistended. No hepatosplenomegaly. No bruits or masses. Good bowel sounds. Extremities: no cyanosis, clubbing, rash, edema Neuro: alert & oriented x 3, cranial nerves grossly intact. moves all 4 extremities w/o difficulty. Affect pleasant.  ECG: SR 85 bpm QRS 68 ms    ASSESSMENT & PLAN: Chronic HFeEF, ICM Echo in 2020 showed EF 55-60%. Most recent Echo 09/2020 EF 20-25%.   LHC with 09/2020 with severe coronary disease. total occlusion of the proximal LAD, moderate stenosis of the intermediate branch, and severe ostial circumflex in-stent restenosis.  NYHA III. Volume status stable. Can take lasix as needed. Ordered 20 mg lasix as needed for 3 pound weight gain.   - Continue 12.5 mg Toprol XL daily  - Lightheaded today. Hold off on med changes. Would not start entresto today.   - Continue 12.5 mg spironolactone daily.  - Not a candidate for  SGLT2 due to DMII  -Plan to repeat ECHO in 3 months after HF meds optimized. Narrow QRS  2. CAD  H/O CABG. LHC 09/2020 Severe coronary disease with total occlusion of the proximal LAD, moderate stenosis of the intermediate branch, and severe ostial circumflex in-stent restenosis - Given script for SL NTG - No chest pain. No bleeding issues.  -On aspirin, statin, and plavix.   3. Hypothyroidism  Followed by Dr Loanne Drilling   4.DMI  Uncontrolled. Followed by Dr Loanne Drilling.   Discussed reduced work hours and may need to stop all together. Referred to [facility]FS[facility]>   HFSW- uninsured.   Referred to HFSW:  Yes uninsured.  Refer to Pharmacy:  No Refer to Home Health:  No Refer to Advanced Heart Failure Clinic: Yes  Refer to General Cardiology: Shared   Follow up  in 3 weeks with HF APP and 3 months with Dr  Donnamae Jude NP-C  4:15 PM

## 2020-10-14 ENCOUNTER — Other Ambulatory Visit: Payer: Self-pay

## 2020-10-14 ENCOUNTER — Ambulatory Visit (HOSPITAL_COMMUNITY)
Admission: RE | Admit: 2020-10-14 | Discharge: 2020-10-14 | Disposition: A | Discharge status: Home / Self Care | Payer: Self-pay | Source: Ambulatory Visit | Attending: Adult Health | Admitting: Adult Health

## 2020-10-14 VITALS — BP 102/68 | HR 86 | Wt 137.6 lb

## 2020-10-14 DIAGNOSIS — Z888 Allergy status to other drugs, medicaments and biological substances status: Secondary | ICD-10-CM | POA: Insufficient documentation

## 2020-10-14 DIAGNOSIS — E108 Type 1 diabetes mellitus with unspecified complications: Secondary | ICD-10-CM

## 2020-10-14 DIAGNOSIS — Z5941 Food insecurity: Secondary | ICD-10-CM | POA: Insufficient documentation

## 2020-10-14 DIAGNOSIS — E1065 Type 1 diabetes mellitus with hyperglycemia: Secondary | ICD-10-CM | POA: Insufficient documentation

## 2020-10-14 DIAGNOSIS — Z951 Presence of aortocoronary bypass graft: Secondary | ICD-10-CM | POA: Insufficient documentation

## 2020-10-14 DIAGNOSIS — I5181 Takotsubo syndrome: Secondary | ICD-10-CM

## 2020-10-14 DIAGNOSIS — Z79899 Other long term (current) drug therapy: Secondary | ICD-10-CM | POA: Insufficient documentation

## 2020-10-14 DIAGNOSIS — I255 Ischemic cardiomyopathy: Secondary | ICD-10-CM

## 2020-10-14 DIAGNOSIS — Z7982 Long term (current) use of aspirin: Secondary | ICD-10-CM | POA: Insufficient documentation

## 2020-10-14 DIAGNOSIS — E039 Hypothyroidism, unspecified: Secondary | ICD-10-CM | POA: Insufficient documentation

## 2020-10-14 DIAGNOSIS — I5022 Chronic systolic (congestive) heart failure: Secondary | ICD-10-CM

## 2020-10-14 DIAGNOSIS — Z79890 Hormone replacement therapy (postmenopausal): Secondary | ICD-10-CM | POA: Insufficient documentation

## 2020-10-14 DIAGNOSIS — Z7951 Long term (current) use of inhaled steroids: Secondary | ICD-10-CM | POA: Insufficient documentation

## 2020-10-14 DIAGNOSIS — Z881 Allergy status to other antibiotic agents status: Secondary | ICD-10-CM | POA: Insufficient documentation

## 2020-10-14 DIAGNOSIS — I2510 Atherosclerotic heart disease of native coronary artery without angina pectoris: Secondary | ICD-10-CM

## 2020-10-14 DIAGNOSIS — Z955 Presence of coronary angioplasty implant and graft: Secondary | ICD-10-CM | POA: Insufficient documentation

## 2020-10-14 DIAGNOSIS — Z7902 Long term (current) use of antithrombotics/antiplatelets: Secondary | ICD-10-CM | POA: Insufficient documentation

## 2020-10-14 DIAGNOSIS — Z8249 Family history of ischemic heart disease and other diseases of the circulatory system: Secondary | ICD-10-CM | POA: Insufficient documentation

## 2020-10-14 DIAGNOSIS — Z596 Low income: Secondary | ICD-10-CM | POA: Insufficient documentation

## 2020-10-14 DIAGNOSIS — Z794 Long term (current) use of insulin: Secondary | ICD-10-CM | POA: Insufficient documentation

## 2020-10-14 LAB — BASIC METABOLIC PANEL
Anion gap: 5 (ref 5–15)
BUN: 6 mg/dL (ref 6–20)
CO2: 27 mmol/L (ref 22–32)
Calcium: 8.9 mg/dL (ref 8.9–10.3)
Chloride: 104 mmol/L (ref 98–111)
Creatinine, Ser: 0.9 mg/dL (ref 0.44–1.00)
GFR, Estimated: >60 mL/min (ref >60)
Glucose, Bld: 158 mg/dL — ABNORMAL HIGH (ref 70–99)
Potassium: 5.6 mmol/L — ABNORMAL HIGH (ref 3.5–5.1)
Sodium: 136 mmol/L (ref 135–145)

## 2020-10-14 MED ORDER — FUROSEMIDE 20 MG PO TABS
20.0000 mg | ORAL_TABLET | Freq: Every day | ORAL | 3 refills | Status: DC | PRN
  Filled 2020-10-14 – 2021-01-13 (×3): qty 15, 15d supply, fill #0

## 2020-10-14 MED ORDER — NITROGLYCERIN 0.4 MG SL SUBL
0.4000 mg | SUBLINGUAL_TABLET | SUBLINGUAL | 6 refills | Status: DC | PRN
  Filled 2020-10-14: qty 25, 25d supply, fill #0
  Filled 2020-10-21 – 2021-01-13 (×2): qty 25, 1d supply, fill #0

## 2020-10-14 NOTE — Progress Notes (Signed)
Heart and Vascular Care Navigation  10/14/2020  Jennifer Hogan August 17, 1970 [id number]  Reason for Referral: Patient seen in HF TOC.    Engaged with patient face to face for initial visit for Heart and Vascular Care Coordination.                                                                                                   Assessment:  Patient is a 50 yo female who lives alone in a single family home. Patient reports she has worked full time for years but recently reduced her hours to part time. Patient has a pending medicaid application but reports she has not heard any follow up yet. She stated she has a message from the [facility] regarding starting her Social Security disability application and plans to return call after appointment today.  Patient denies any other SDoH needs at this time.                                 HRT/VAS Care Coordination     Patients Home Cardiology Office --  [facility]   Outpatient Care Team Social Worker   Social Worker Name: Raquel Sarna, [place] [phone]   Living arrangements for the past 2 months Single Family Home   Lives with: Self   Patient Current Insurance Coverage Medicaid Pending   Patient Has Concern With Paying Medical Bills Yes   Does Patient Have Prescription Coverage? No   Patient Prescription Assistance Programs Other  [place] and costs are manageable   Home Assistive Devices/Equipment None   DME Agency NA   [facility] Agency NA       Social History:                                                                             SDOH Screenings   Alcohol Screen: Low Risk    Last Alcohol Screening Score (AUDIT): 0  Depression (PHQ2-9): Not on file  Financial Resource Strain: High Risk   Difficulty of Paying Living Expenses: Very hard  Food Insecurity: Food Insecurity Present   Worried About Charity fundraiser in the Last Year: Sometimes true   Arboriculturist in the Last Year: Sometimes true   Housing: Low Risk    Last Housing Risk Score: 0  Physical Activity: Not on file  Social Connections: Not on file  Stress: Not on file  Tobacco Use: Low Risk    Smoking Tobacco Use: Never   Smokeless Tobacco Use: Never  Transportation Needs: No Transportation Needs   Lack of Transportation (Medical): No   Lack of Transportation (Non-Medical): No    SDOH Interventions: Financial Resources:    Fish farm manager for Disability application assistance and  pending medicaid  Food Insecurity:   N/a  Housing Insecurity:   N/a  Transportation:    N/a   Inpatient/Outpatient Substance Abuse Counseling/Rehab Options N/a  Provided Pharmacy assistance resources Other ([place] and costs are manageable)  Patient expressed Mental Health concerns No.  Patient Referred to: N/a   Follow-up plan:  Patient verbalizes understanding of follow up and denies any other concerns at this time. Raquel Sarna, [place], [place]

## 2020-10-14 NOTE — Patient Instructions (Addendum)
Take Furosemide 20 mg only AS NEEDED for weight gain of 3-4 lbs  Labs done today, your results will be available in MyChart, we will contact you for abnormal readings.  Your physician recommends that you schedule a follow-up appointment in: 3 weeks then again in 3 months with an echocardiogram  Do the following things EVERYDAY: Weigh yourself in the morning before breakfast. Write it down and keep it in a log. Take your medicines as prescribed Eat low salt foods--Limit salt (sodium) to 2000 mg per day.  Stay as active as you can everyday Limit all fluids for the day to less than 2 liters  Thank you for allowing [name] to provider your heart failure care after your recent hospitalization. Please follow-up with our [facility] in 3 weeks and then again in 2-3 months with Dr Haroldine Laws  If you have any questions, issues, or concerns before your next appointment please call our office at [phone], opt. [phone] and leave a message for the triage nurse.

## 2020-10-15 ENCOUNTER — Other Ambulatory Visit: Payer: Self-pay

## 2020-10-17 IMAGING — DX DG CHEST 1V PORT
1 series · 1 of 1 positions shown · non-contrast
Comparison: Most recent radiograph 06/06/2019, CT same day

CLINICAL DATA: Chest pain fever.

EXAM:
PORTABLE CHEST 1 VIEW

[chest]
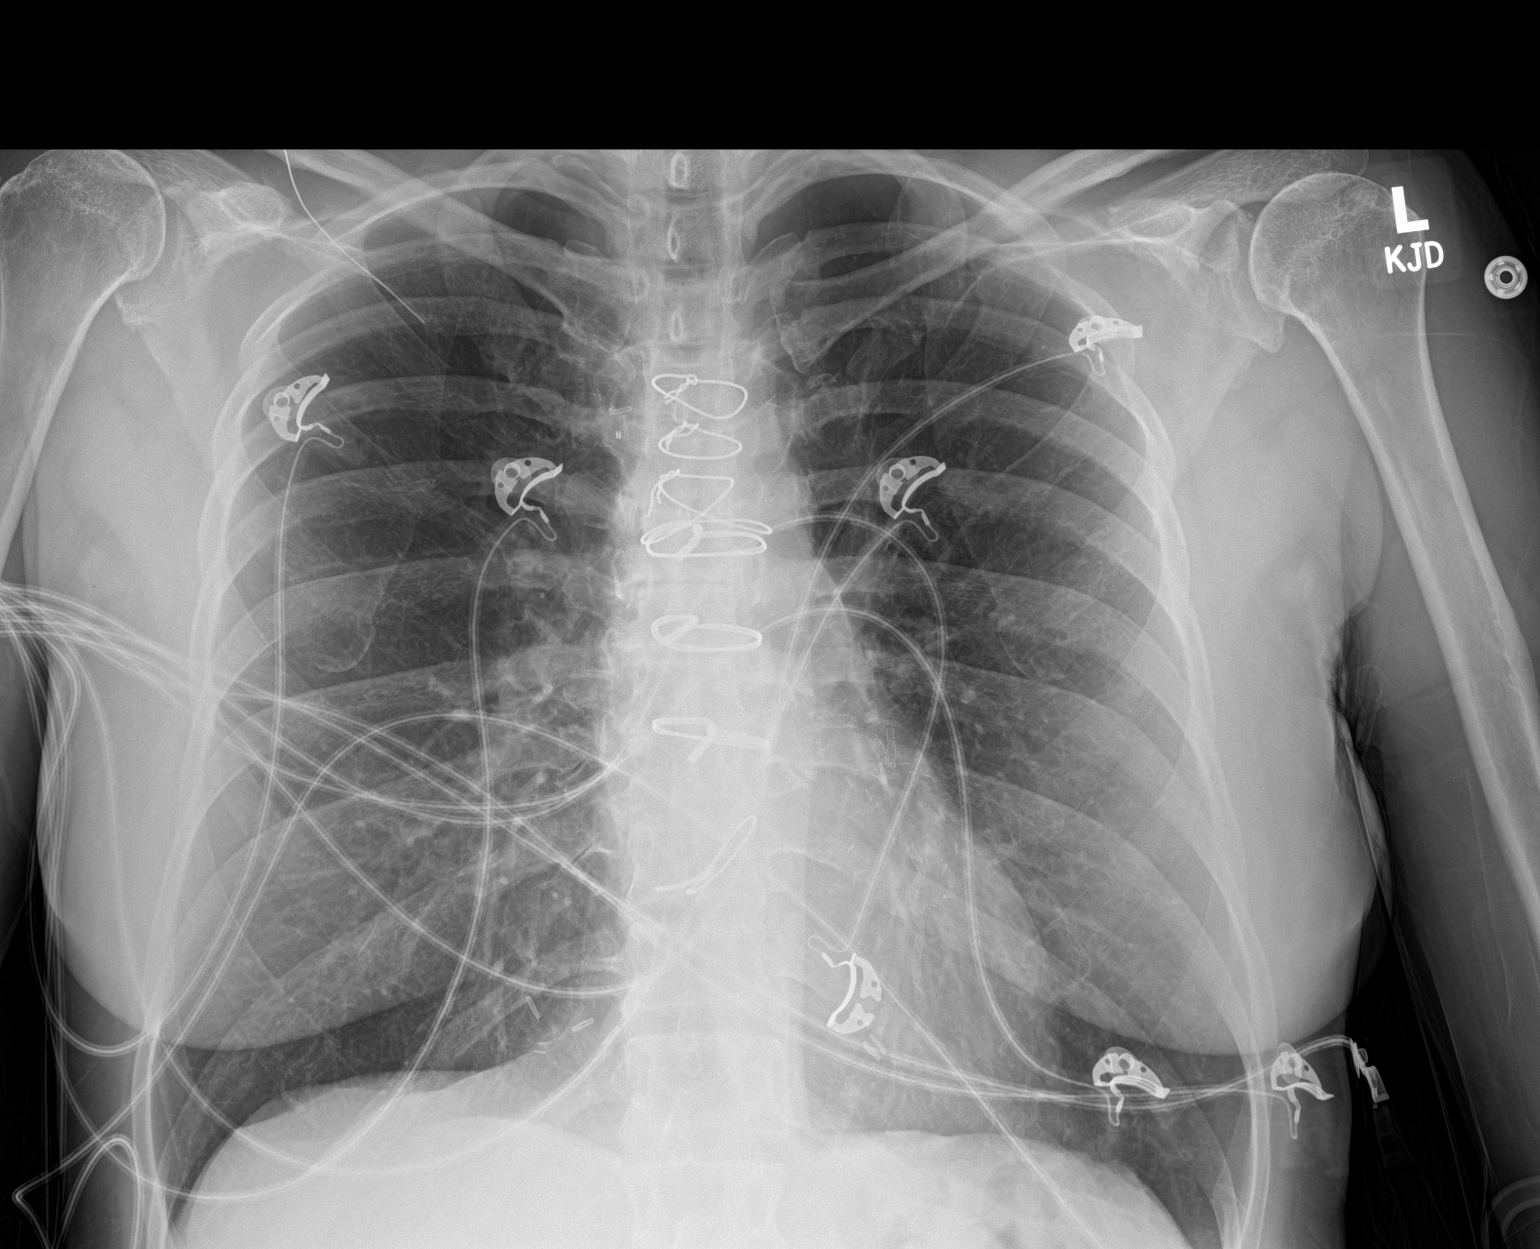

[1 of 1 positions shown; findings below may reference images not displayed]

FINDINGS: Post median sternotomy and CABG.The cardiomediastinal contours are
normal. Coronary stents are visualized. The lungs are clear.
Pulmonary vasculature is normal. No consolidation, pleural effusion,
or pneumothorax. No acute osseous abnormalities are seen.
IMPRESSION: No acute chest findings.

## 2020-10-18 IMAGING — CT CT ABD-PELV W/ CM
2 of 5 series · 16 of 46 positions shown, 18 images · IV contrast (Omni 300)
Comparison: CT abdomen and pelvis 11/24/2018

CLINICAL DATA: Blood cultures drawn yesterday are positive for
MRSA. Decreased urine output, abdominal pain and vomiting.

EXAM:
CT ABDOMEN AND PELVIS WITH CONTRAST
TECHNIQUE: Multidetector CT imaging of the abdomen and pelvis was performed
using the standard protocol following bolus administration of
intravenous contrast.
CONTRAST:  100 mL OMNIPAQUE IOHEXOL 300 MG/ML  SOLN

[Series 3: a/p w/ 5mm · axial · 0.76mm/px · z∈[+755,+1170]mm · 13 of 93 slices shown, 15 images]
[im 5/93  soft-tissue]
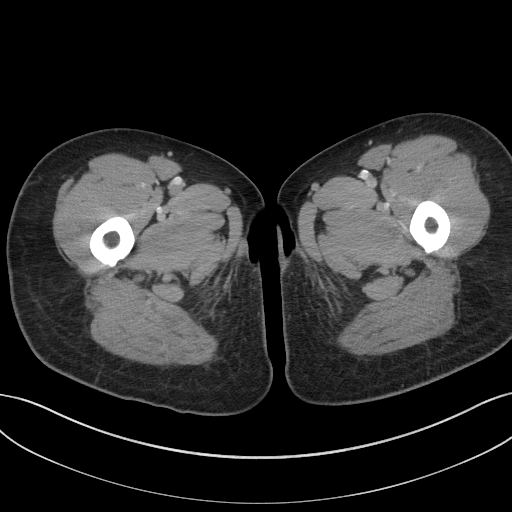
[im 5/93  bone]
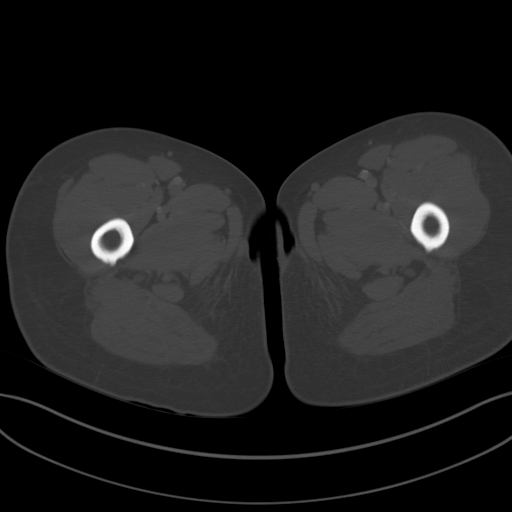
[im 15/93  soft-tissue]
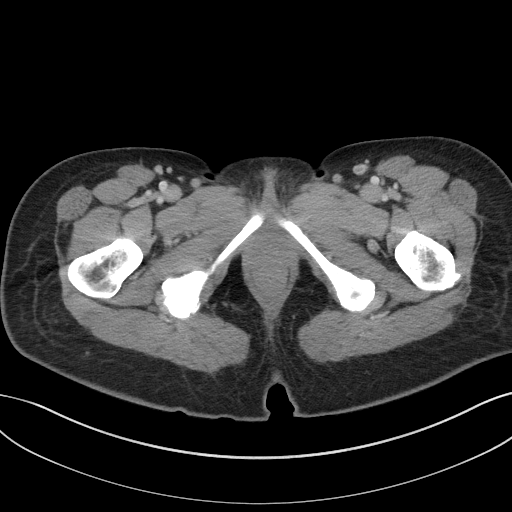
[im 20/93  soft-tissue]
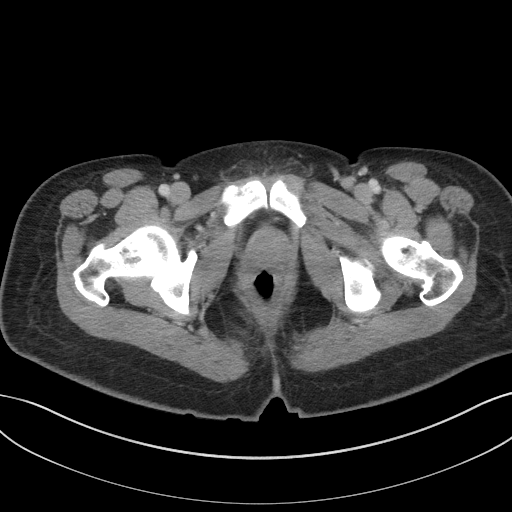
[im 25/93  soft-tissue]
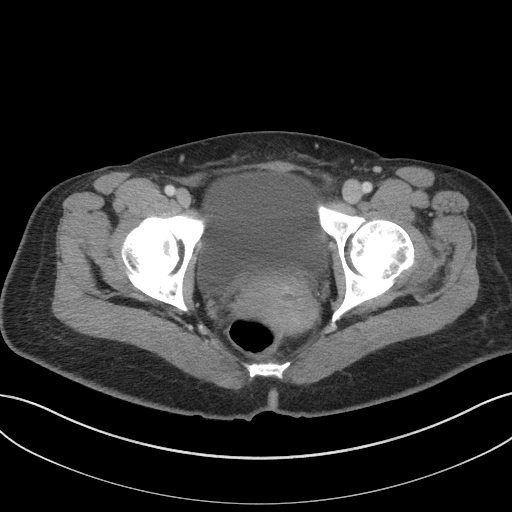
[im 34/93  soft-tissue]
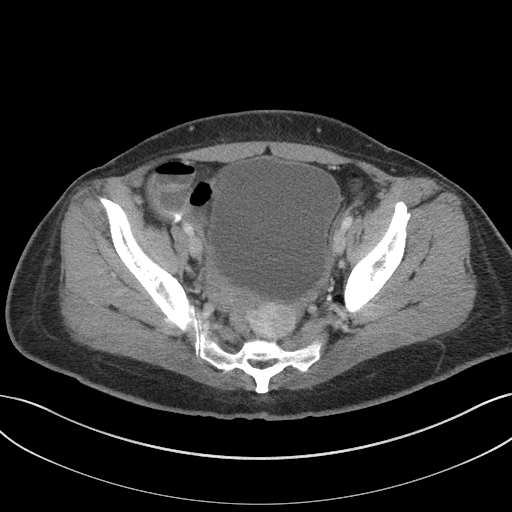
[im 39/93  soft-tissue]
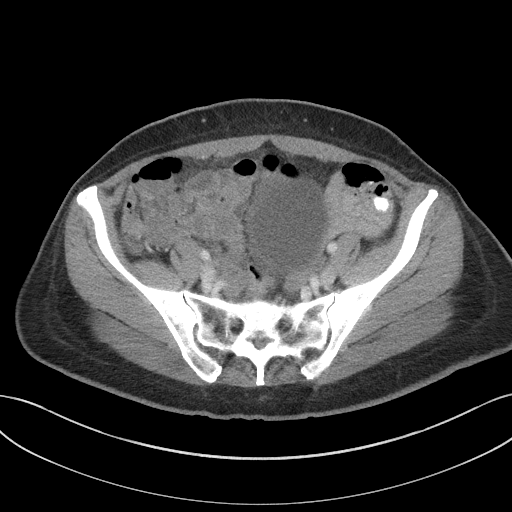
[im 49/93  soft-tissue]
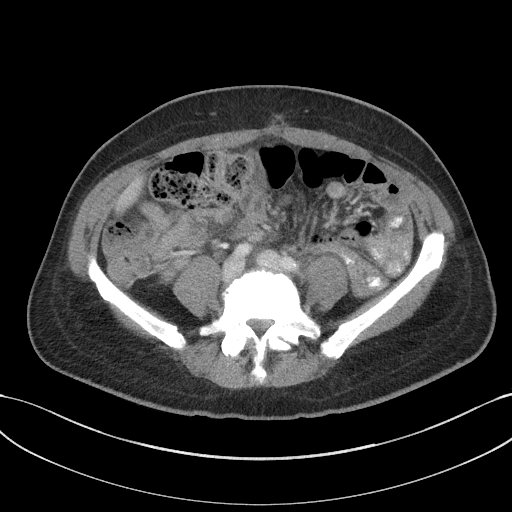
[im 54/93  soft-tissue]
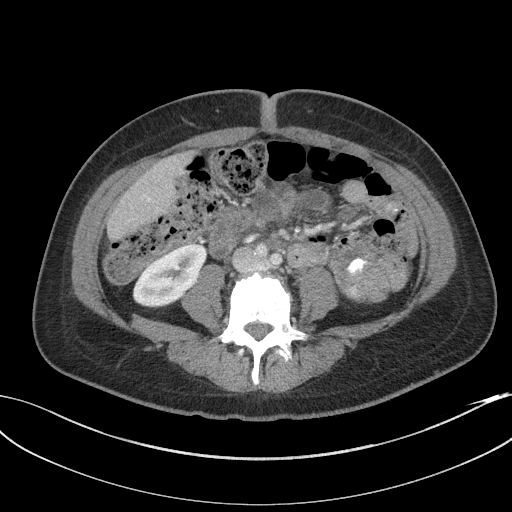
[im 59/93  soft-tissue]
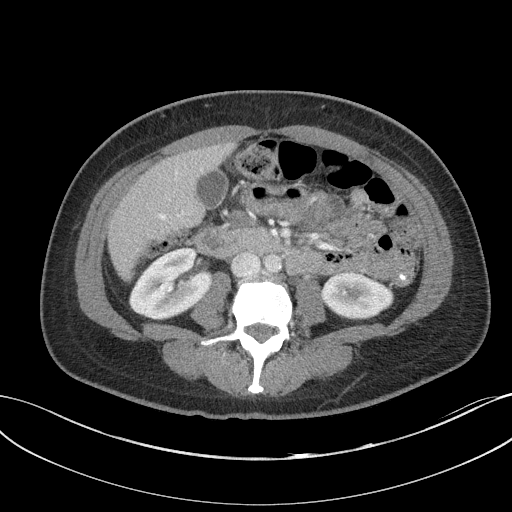
[im 59/93  bone]
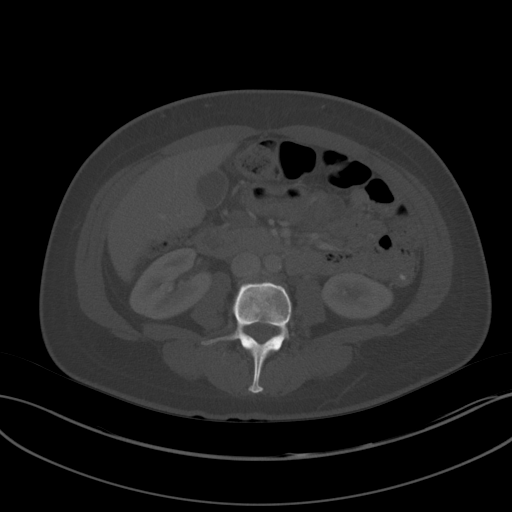
[im 68/93  soft-tissue]
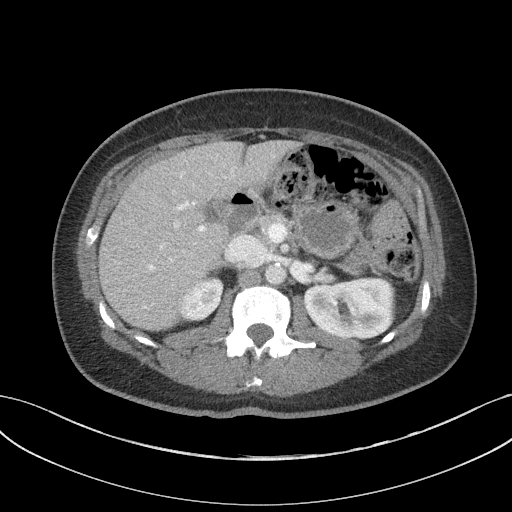
[im 73/93  soft-tissue]
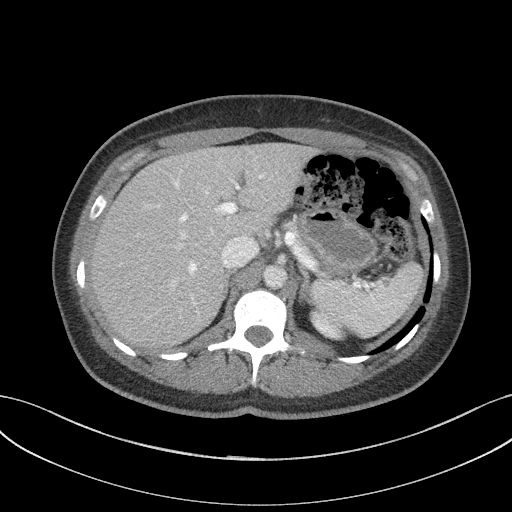
[im 78/93  soft-tissue]
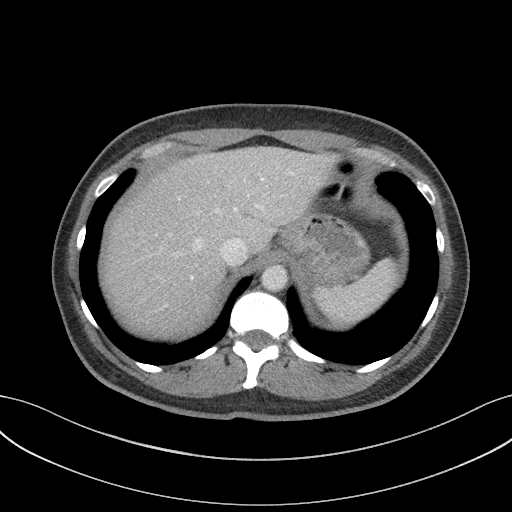
[im 88/93  soft-tissue]
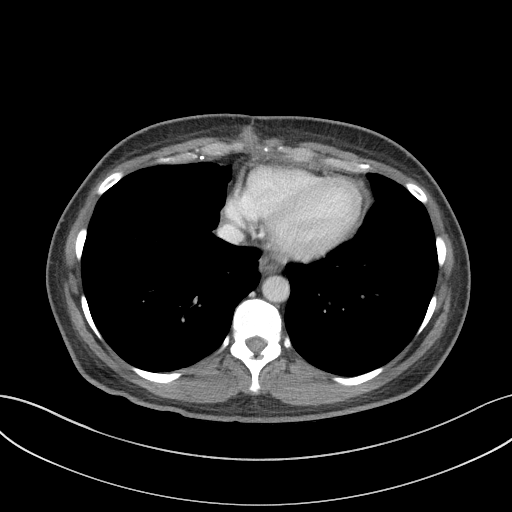

[Series 6: a/p w/ cor · coronal · 0.72mm/px · 3 of 131 slices shown]
[im 44/131  soft-tissue]
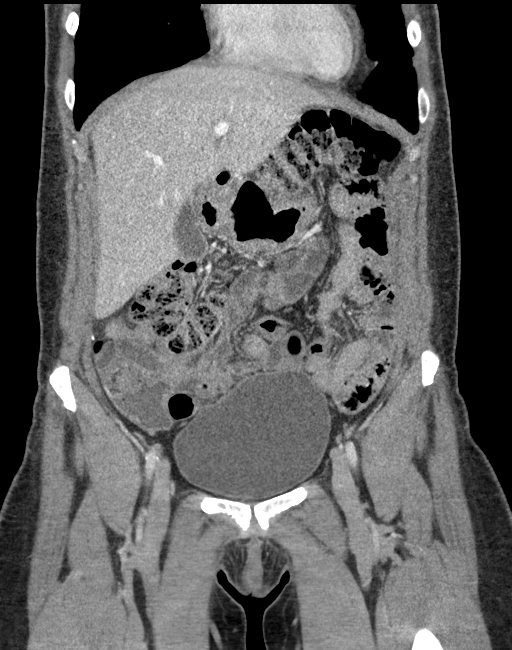
[im 58/131  soft-tissue]
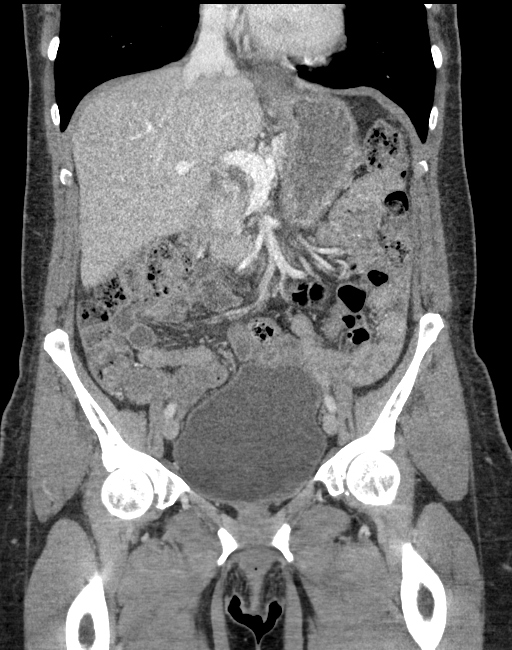
[im 73/131  soft-tissue]
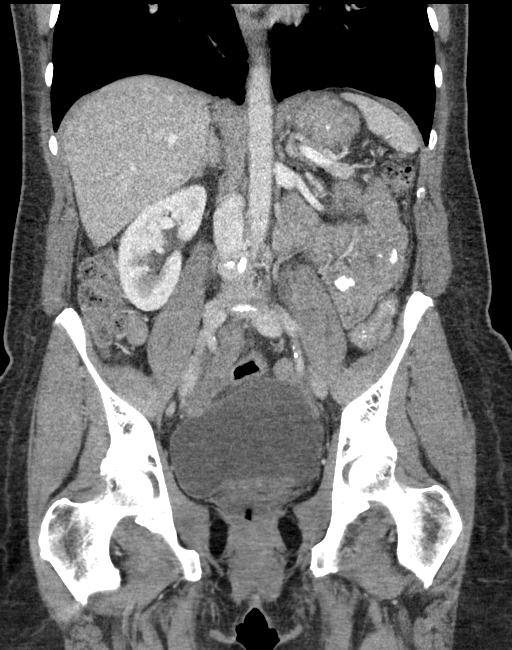

[16 of 46 positions shown; findings below may reference images not displayed]

FINDINGS: Lower chest: Small areas of ground-glass attenuation are seen in
medial aspects of both the right and left lower lobes. Lung bases
otherwise clear. No pleural or pericardial effusion.

Hepatobiliary: No focal liver abnormality is seen. No gallstones,
gallbladder wall thickening, or biliary dilatation. The liver is
mildly low attenuating consistent with fatty infiltration.

Pancreas: The pancreas is atrophic. No mass or surrounding
inflammatory change.

Spleen: Normal in size without focal abnormality.

Adrenals/Urinary Tract: Adrenal glands are unremarkable. Kidneys are
normal, without renal calculi, focal lesion, or hydronephrosis.
Bladder is unremarkable.

Stomach/Bowel: Stomach is within normal limits. Status post
appendectomy. No evidence of bowel wall thickening, distention, or
inflammatory changes.

Vascular/Lymphatic: Aortic atherosclerosis. No enlarged abdominal or
pelvic lymph nodes.

Reproductive: Uterus and bilateral adnexa are unremarkable.

Other: None.

Musculoskeletal: No acute or focal abnormality. Marked degenerative
disease L3-4 and L4-5 and straightening of lumbar lordosis appear
unchanged.
IMPRESSION: No acute abnormality abdomen or pelvis.

Small areas of ground-glass attenuation in the medial aspect of both
the right and left lower lobes could be due to inflammatory change
atelectasis.

Mild fatty infiltration of the liver.

Atherosclerosis.

## 2020-10-18 NOTE — Discharge Summary (Addendum)
Physician Discharge Summary  Jennifer Hogan YIR:[id number] DOB: 1970/08/20 DOA: 09/30/2020  PCP: Jennifer Pounds, NP  Admit date: 09/30/2020 Discharge date: 10/06/2020  Recommendations for Outpatient Follow-up:  Discharge to home. Follow up with PCP I n 7-10 days. Chemistry to be drawn at that visit and reported to PCP Follow up with cardiology [facility] in 2 weeks for up-titration of cardiac medications. Echocardiogram in 2 months to follow EF. Follow up with lipid clinic in 2-4 weeks for consideration of Repatha vs Praluent for lipid control. Weigh self daily and contact cardiology any time you gain 3 lbs or more in one day, 5 lbs or more in one week, or have to sleep on 3 pillows due to shortness of breath.   Follow-up [facility] Follow up on 10/29/2020.   Why: @ 9:30 am with Jennifer Hogan for [facility] follow up appointment. Please call the office if you cannot make this scheduled appointment. Contact information: [place] [address] [phone]        Jennifer Burn, PA-C Follow up.   Specialty: Cardiology Why: [facility] follow-up with Cardiology scheduled for 10/26/2020 at 12:15pm with Jennifer Hogan, one of Dr. Theodosia Hogan PAs. If this date/time does not work for you, please call our office to reschedule. Contact information: [address] [place] [address] [phone]                  Discharge Diagnoses: Principal diagnosis is #1 Acute respiratory failure with hypoxia due to flash pulmonary edem ain the setting of acute diastolic heart failure due to acute coronary syndrome. LHC was performed on 10/05/2020 LHC yesterday showed severe native vessel CAD with total occlusion of the proximal LAD, moderate IM stenosis and severe ostial LCx in-stent restenosis with patency of the RIMA>LCx and SVG sequential to IM and LAD.  LVEDP was normal and medical management  recommended.; CHF with EF of 20%. 1/2 blood cultures positive for strep bacteremia.  She completed 5 days of appropriate antibiotics. The patient was evaluated by Dr. Juleen Hogan of infectious diseases. He felt that the organism was most likely a contaminant. DKA due to insulin pump disfunction. Stress NSTEMI - Type II MI - ruled in   Discharge Condition: Fair Disposition: Home  Diet recommendation: Heart healthy/modified carbohydrate  Filed Weights   10/01/20 1530 10/03/20 0400 10/04/20 0929  Weight: 62.2 kg 62.5 kg 61 kg    History of present illness:  [name] is a 50 y.o. female with known history of diabetes mellitus type 1 on insulin pump, CAD status post stenting and CABG, hypothyroidism presents to the ER after patient started feeling weak with elevated blood sugar.  Patient states 2 days ago she ate something and following which she had vomiting episodes.  Patient also has been having diaphoresis.  Patient was sweating so much that her insulin pump got detached.  Yesterday morning patient's blood sugars were running high patient was feeling weak and fatigue some chest pressure and sneezing.  Patient decided to come to the ER.  Patient states over the last 2 weeks she has been feeling fatigued.  In addition patient stated that she was tachycardic.   ED Course: In the ER patient is found to be having low normal blood pressure with labs showing creatinine of 1.6 bicarb of 10 and anion gap of 22 WBC count of 20,000 with high sensitive troponin was 61 and 54 EKG  showing sinus tachycardia with nonspecific ST changes.  Given the nausea vomiting chest pressure patient underwent CT abdomen pelvis and CT angiogram of the chest.  CT abdomen pelvis was unremarkable.  CT angio of the chest shows bilateral infiltrates concerning for pneumonia other differentials include eosinophilic pneumonia and interstitial pneumonia.  Patient's lactic acid was 6.3.  Was started on fluid bolus following which  lactic acid improved.  Given the patient had fever 100.1 F leukocytosis and CT scan showing features concerning for pneumonia was started on empiric antibiotics.  Patient was also placed on insulin infusion for DKA.   [facility] Course:  The patient was admitted for DKA due to insulin pump malfunction. She was placed on the DKA protocol. Her gap closed and the drip was stopped. In the ED she was found to have elevated troponins. Cardiology was consulted and she was found to have unstable angina. Echocardiogram was obtained and demonstrated an EF of 20% which represented an abrupt decrease in EF from a previous echocardiogram. The patient underwent LHC on 10/05/2020. It demonstrated  severe native vessel CAD with total occlusion of the proximal LAD, moderate IM stenosis and severe ostial LCx in-stent restenosis with patency of the RIMA>LCx and SVG sequential to IM and LAD.  LVEDP was normal and medical management recommended.     The patient was discharged to home in fair condition on 10/06/2020.  Today's assessment: S: Patient is awake, alert, and oriented x 3. No new complaints. O: Vitals:  Vitals:   10/06/20 0807 10/06/20 1201  BP:    Pulse:    Resp:    Temp:  98.4 F (36.9 C)  SpO2: 99%     Constitutional:  Patient is awake, alert, and oriented x 3. No acute distress. Respiratory:  CTA bilaterally, no w/r/r.  Respiratory effort normal. No retractions or accessory muscle use Cardiovascular:  RRR, no m/r/g No LE extremity edema   Normal pedal pulses Abdomen:  Abdomen appears normal; no tenderness or masses No hernias No HSM Musculoskeletal:  Digits/nails: no clubbing, cyanosis, petechiae, infection exam of joints, bones, muscles of at least one of following: head/neck, RUE, LUE, RLE, LLE   strength and tone normal, no atrophy, no abnormal movements No tenderness, masses Normal ROM, no contractures  gait and station Skin:  No rashes, lesions, ulcers palpation of skin: no  induration or nodules Neurologic:  CN 2-12 intact Sensation all 4 extremities intact Psychiatric:  judgement and insight appear normal Mental status Mood, affect appropriate Orientation to person, place, time   Discharge Instructions  Discharge Instructions     (HEART FAILURE PATIENTS) Call MD:  Anytime you have any of the following symptoms: 1) 3 pound weight gain in 24 hours or 5 Hogan in 1 week 2) shortness of breath, with or without a dry hacking cough 3) swelling in the hands, feet or stomach 4) if you have to sleep on extra pillows at night in order to breathe.   Complete by: As directed    AMB referral to CHF clinic   Complete by: As directed    For up-titration of cardiac medications.   Call MD for:  difficulty breathing, headache or visual disturbances   Complete by: As directed    Diet - low sodium heart healthy   Complete by: As directed    Diet Carb Modified   Complete by: As directed    Discharge instructions   Complete by: As directed    Discharge to home. Follow up with PCP I n  7-10 days. Chemistry to be drawn at that visit and reported to PCP Follow up with cardiology [facility] in 2 weeks for up-titration of cardiac medications. Echocardiogram in 2 months to follow EF. Follow up with lipid clinic in 2-4 weeks for consideration of Repatha vs Praluent for lipid control.   Heart Failure patients record your daily weight using the same scale at the same time of day   Complete by: As directed    Increase activity slowly   Complete by: As directed       Allergies as of 10/06/2020       Reactions   Atorvastatin Other (See Comments)   Myalgias on 80mg  dose, tolerates 20mg  ok   Crestor [rosuvastatin Calcium] Other (See Comments)   Severe myalgias and joint pain. Has tolerated atorvastatin though   Monosodium Glutamate Nausea And Vomiting, Other (See Comments)   migraine   Peanut-containing Drug Products Other (See Comments)   Vomiting, upset stomach and some  wheezing   Zetia [ezetimibe] Other (See Comments)   myalgias   Ciprofloxin Hcl [ciprofloxacin] Nausea And Vomiting, Other (See Comments)   Severe migraine   Food Color Red [red Dye] Diarrhea        Medication List     STOP taking these medications    Alpha-Lipoic Acid 100 MG Tabs   benzonatate 200 MG capsule Commonly known as: TESSALON   ferrous sulfate 325 (65 FE) MG tablet   LORazepam 0.5 MG tablet Commonly known as: Ativan   Qvar RediHaler 40 MCG/ACT inhaler Generic drug: beclomethasone       TAKE these medications    acetaminophen 325 MG tablet Commonly known as: TYLENOL Take 650 mg by mouth daily as needed (couch/pain/fever).   albuterol 108 (90 Base) MCG/ACT inhaler Commonly known as: VENTOLIN HFA INHALE 2 PUFFS INTO THE LUNGS EVERY 6 (SIX) HOURS AS NEEDED FOR WHEEZING OR SHORTNESS OF BREATH.   aspirin 81 MG EC tablet Take 1 tablet (81 mg total) by mouth daily. What changed:  when to take this reasons to take this   atorvastatin 20 MG tablet Commonly known as: LIPITOR Take 1 tablet (20 mg total) by mouth daily.   b complex vitamins tablet Take 1 tablet by mouth daily.   budesonide 0.25 MG/2ML nebulizer solution Commonly known as: PULMICORT Take 2 mLs (0.25 mg total) by nebulization 2 (two) times daily.   clopidogrel 75 MG tablet Commonly known as: PLAVIX TAKE 1 TABLET BY MOUTH DAILY WITH BREAKFAST.   diclofenac Sodium 1 % Gel Commonly known as: Voltaren Apply 2 g topically 4 (four) times daily. What changed:  when to take this reasons to take this   FLUoxetine 40 MG capsule Commonly known as: PROZAC TAKE 1 CAPSULE BY MOUTH DAILY.   fluticasone 50 MCG/ACT nasal spray Commonly known as: FLONASE Place 1 spray into both nostrils at bedtime as needed for allergies.   HumaLOG 100 UNIT/ML injection Generic drug: insulin lispro Inject 35 units into the skin daily via pump   hydrOXYzine 25 MG tablet Commonly known as:  ATARAX/VISTARIL Take 1 tablet (25 mg total) by mouth 3 (three) times daily as needed. What changed: reasons to take this   insulin pump Soln Inject into the skin continuous. Humalog   ipratropium 0.06 % nasal spray Commonly known as: ATROVENT Place 2 sprays into both nostrils 2 (two) times daily as needed for rhinitis.   levothyroxine 100 MCG tablet Commonly known as: SYNTHROID TAKE 1 TABLET (100 MCG TOTAL) BY MOUTH DAILY BEFORE  BREAKFAST.   metoCLOPramide 5 MG tablet Commonly known as: Reglan Take 1 tablet (5 mg total) by mouth every 12 (twelve) hours as needed for nausea or vomiting.   metoprolol succinate 25 MG 24 hr tablet Commonly known as: TOPROL-XL Take 0.5 tablets (12.5 mg total) by mouth daily.   montelukast 10 MG tablet Commonly known as: SINGULAIR TAKE 1 TABLET (10 MG TOTAL) BY MOUTH AT BEDTIME. What changed: how much to take   ondansetron 4 MG disintegrating tablet Commonly known as: ZOFRAN-ODT TAKE 1 TABLET (4 MG TOTAL) BY MOUTH EVERY 8 (EIGHT) HOURS AS NEEDED FOR NAUSEA OR VOMITING. What changed:  how much to take reasons to take this   pantoprazole 40 MG tablet Commonly known as: PROTONIX Take 1 tablet (40 mg total) by mouth 2 (two) times daily as needed (acid reflux). What changed:  when to take this reasons to take this additional instructions   spironolactone 25 MG tablet Commonly known as: ALDACTONE Take 0.5 tablets (12.5 mg total) by mouth daily.   sucralfate 1 GM/10ML suspension Commonly known as: CARAFATE Take 10 mLs (1 g total) by mouth 4 (four) times daily -  with meals and at bedtime.       Allergies  Allergen Reactions   Atorvastatin Other (See Comments)    Myalgias on 80mg  dose, tolerates 20mg  ok   Crestor [Rosuvastatin Calcium] Other (See Comments)    Severe myalgias and joint pain. Has tolerated atorvastatin though   Monosodium Glutamate Nausea And Vomiting and Other (See Comments)    migraine   Peanut-Containing Drug Products  Other (See Comments)    Vomiting, upset stomach and some wheezing   Zetia [Ezetimibe] Other (See Comments)    myalgias   Ciprofloxin Hcl [Ciprofloxacin] Nausea And Vomiting and Other (See Comments)    Severe migraine   Food Color Red [Red Dye] Diarrhea    The results of significant diagnostics from this hospitalization (including imaging, microbiology, ancillary and laboratory) are listed below for reference.    Significant Diagnostic Studies: DG Chest 2 View  Result Date: 09/30/2020 CLINICAL DATA:  Chest pain and shortness of breath. EXAM: CHEST - 2 VIEW COMPARISON:  03/02/2020, CT 06/06/2019 FINDINGS: Patient is post median sternotomy.The cardiomediastinal contours are normal. Coronary stents are seen. Mild diffuse bronchial wall thickening with borderline hyperinflation. Pulmonary vasculature is normal. No consolidation, pleural effusion, or pneumothorax. No acute osseous abnormalities are seen. IMPRESSION: Mild diffuse bronchial wall thickening with borderline hyperinflation, can be seen with bronchitis or asthma. Electronically Signed   By: Keith Rake M.D.   On: 09/30/2020 17:42   CT Angio Chest PE W and/or Wo Contrast  Result Date: 09/30/2020 CLINICAL DATA:  PE suspected, high prob Chest pain and shortness of breath. EXAM: CT ANGIOGRAPHY CHEST WITH CONTRAST TECHNIQUE: Multidetector CT imaging of the chest was performed using the standard protocol during bolus administration of intravenous contrast. Multiplanar CT image reconstructions and MIPs were obtained to evaluate the vascular anatomy. CONTRAST:  128mL OMNIPAQUE IOHEXOL 350 MG/ML SOLN COMPARISON:  Radiograph earlier today.  Chest CTA 06/06/2019 FINDINGS: Cardiovascular: There are no filling defects within the pulmonary arteries to suggest pulmonary embolus. Normal caliber thoracic aorta. Post CABG with calcification of native coronary arteries. The heart is normal in size. No pericardial effusion. Mediastinum/Nodes: No enlarged  mediastinal lymph nodes. Few scattered bilateral hilar nodes are not enlarged by size criteria. Small hiatal hernia. Lungs/Pleura: Patchy areas of ground-glass opacity throughout both lungs in a peripheral and upper lobe predominant distribution. Moderate central  bronchial thickening. No pleural effusion. No pulmonary nodule or mass. Upper Abdomen: Assessed on concurrent abdominal CT, reported separately. Musculoskeletal: Post median sternotomy. Thoracic spondylosis with midthoracic There are no acute or suspicious osseous abnormalities. degenerative disc disease. Review of the MIP images confirms the above findings. IMPRESSION: 1. No pulmonary embolus. 2. Patchy areas of ground-glass opacity throughout both lungs in a peripheral and upper lobe predominant distribution. Findings may represent pneumonia, including COVID-19. Possibility of eosinophilic pneumonia or interstitial pneumonias are also considered. 3. Moderate central bronchial thickening, can be seen with bronchitis or reactive airways disease. 4. Small hiatal hernia. Electronically Signed   By: Keith Rake M.D.   On: 09/30/2020 21:58   CT ABDOMEN PELVIS W CONTRAST  Result Date: 09/30/2020 CLINICAL DATA:  Abdominal distension. EXAM: CT ABDOMEN AND PELVIS WITH CONTRAST TECHNIQUE: Multidetector CT imaging of the abdomen and pelvis was performed using the standard protocol following bolus administration of intravenous contrast. CONTRAST:  167mL OMNIPAQUE IOHEXOL 350 MG/ML SOLN COMPARISON:  Assessed on chest CTA performed concurrently. FINDINGS: Lower chest: Abdominopelvic CT 07/14/2019 Hepatobiliary: Mild hepatic steatosis. No evidence of focal liver lesion. Gallbladder physiologically distended, no calcified stone. No biliary dilatation. Pancreas: No ductal dilatation or inflammation. Spleen: Normal in size without focal abnormality. Adrenals/Urinary Tract: Normal adrenal glands. No hydronephrosis or perinephric edema. Homogeneous renal enhancement.  No evidence of focal lesion or stone. Urinary bladder is distended without wall thickening. Stomach/Bowel: Small hiatal hernia. Stomach is partially distended, unremarkable. There is no small bowel obstruction or inflammation. Appendectomy. The colon is near completely decompressed and not well assessed, no obvious pericolonic edema. Vascular/Lymphatic: Normal caliber abdominal aorta. Minimal aortic atherosclerosis. Patent portal vein. No enlarged lymph nodes in the abdomen or pelvis. Reproductive: Uterus and bilateral adnexa are unremarkable. Other: No free air, free fluid, or intra-abdominal fluid collection. Musculoskeletal: Degenerative disc disease at L3-L4 and L4-L5 with Modic endplate changes and vacuum phenomena. Stable appearance from prior exam. There are no acute or suspicious osseous abnormalities. IMPRESSION: 1. No acute abnormality in the abdomen/pelvis. 2. Mild hepatic steatosis. 3. Small hiatal hernia. Aortic Atherosclerosis (ICD10-I70.0). Electronically Signed   By: Keith Rake M.D.   On: 09/30/2020 22:03   CARDIAC CATHETERIZATION  Addendum Date: 10/04/2020     Mid LAD lesion is 60% stenosed.   Ost Cx to Prox Cx lesion is 99% stenosed.   Prox Cx to Mid Cx lesion is 70% stenosed.   Ramus-1 lesion is 60% stenosed.   Ramus-2 lesion is 20% stenosed.   Ramus-3 lesion is 70% stenosed.   Prox LAD to Mid LAD lesion is 100% stenosed.   Dist LAD-1 lesion is 70% stenosed.   Dist LAD-2 lesion is 70% stenosed.   Non-stenotic Mid LAD to Dist LAD lesion was previously treated.   RIMA graft was visualized by angiography and is small.   LIMA and is large.   The graft exhibits no disease.   LV end diastolic pressure is normal. Severe native vessel CAD with total occlusion of the proximal LAD, moderate stenosis of the intermediate branch, and severe ostial circumflex in-stent restenosis S/P CABG with patency of the both the RIMA-circumflex and LAD sequential to the intermediate and LAD Normal LVEDP Recommend:  continued medical therapy  Result Date: 10/04/2020   Prox LAD to Mid LAD lesion is 10% stenosed.   Dist LAD-1 lesion is 99% stenosed.   Dist LAD-2 lesion is 99% stenosed.   Mid LAD lesion is 60% stenosed.   Ost Cx to Prox Cx lesion is  99% stenosed.   Prox Cx to Mid Cx lesion is 70% stenosed.   Ramus-1 lesion is 60% stenosed.   Ramus-2 lesion is 20% stenosed.   Ramus-3 lesion is 70% stenosed.   Non-stenotic Mid LAD to Dist LAD lesion was previously treated.   RIMA graft was visualized by angiography and is small.   LIMA and is large.   The graft exhibits no disease.   LV end diastolic pressure is normal. Severe native vessel CAD with total occlusion of the proximal LAD, moderate stenosis of the intermediate branch, and severe ostial circumflex in-stent restenosis S/P CABG with patency of the both the RIMA-circumflex and LAD sequential to the intermediate and LAD Normal LVEDP Recommend: continued medical therapy   DG Chest Port 1V same Day  Result Date: 10/02/2020 CLINICAL DATA:  50 year old female with DKA.  Shortness of breath. EXAM: PORTABLE CHEST 1 VIEW COMPARISON:  Chest CTA 09/30/2020 and earlier. FINDINGS: Portable AP semi upright view at 1103 hours. New confluent bilateral perihilar pulmonary opacity, left greater than right. Stable lung volumes and mediastinal contour. Prior sternotomy. No pneumothorax or pleural effusion. In unaffected areas the pulmonary vascularity appears normal. No acute osseous abnormality identified. IMPRESSION: New extensive left greater than right confluent perihilar lung opacity. Given the recent CTA favor progressed bilateral pneumonia over asymmetric pulmonary edema. Electronically Signed   By: Genevie Ann M.D.   On: 10/02/2020 11:16   ECHOCARDIOGRAM COMPLETE  Result Date: 10/02/2020    ECHOCARDIOGRAM REPORT   Patient Name:   MEENA BARRANTES Date of Exam: 10/02/2020 Medical Rec #:  [id number]         Height:       65.0 in Accession #:    [id number]        Weight:       137.1  lb Date of Birth:  1970/05/21        BSA:          1.685 m Patient Age:    50 years          BP:           105/68 mmHg Patient Gender: F                 HR:           82 bpm. Exam Location:  Inpatient Procedure: 2D Echo Indications:    NSTEMI  History:        Patient has prior history of Echocardiogram examinations, most                 recent 11/25/2018. Prior CABG; Risk Factors:Diabetes.  Sonographer:    Johny Chess RDCS Referring Phys: [place] Comments: Image acquisition challenging due to uncooperative patient. IMPRESSIONS  1. Global hypokinesis with akinesis of all apical segments. Left ventricular ejection fraction, by estimation, is 20 to 25%. The left ventricle has severely decreased function. The left ventricle demonstrates global hypokinesis. Left ventricular diastolic parameters are consistent with Grade I diastolic dysfunction (impaired relaxation). Elevated left ventricular end-diastolic pressure.  2. Right ventricular systolic function is normal. The right ventricular size is normal. There is normal pulmonary artery systolic pressure.  3. A small pericardial effusion is present.  4. The mitral valve is normal in structure. Trivial mitral valve regurgitation. No evidence of mitral stenosis.  5. The aortic valve is tricuspid. Aortic valve regurgitation is not visualized. No aortic stenosis is present.  6. The inferior vena cava is normal in size with  greater than 50% respiratory variability, suggesting right atrial pressure of 3 mmHg. Comparison(s): Compared with the echo 74/1287, systolic function is worse. FINDINGS  Left Ventricle: Global hypokinesis with akinesis of all apical segments. Left ventricular ejection fraction, by estimation, is 20 to 25%. The left ventricle has severely decreased function. The left ventricle demonstrates global hypokinesis. The left ventricular internal cavity size was normal in size. There is no left ventricular hypertrophy. Left  ventricular diastolic parameters are consistent with Grade I diastolic dysfunction (impaired relaxation). Elevated left ventricular end-diastolic pressure. Right Ventricle: The right ventricular size is normal. No increase in right ventricular wall thickness. Right ventricular systolic function is normal. There is normal pulmonary artery systolic pressure. The tricuspid regurgitant velocity is 2.65 m/s, and  with an assumed right atrial pressure of 3 mmHg, the estimated right ventricular systolic pressure is 86.7 mmHg. Left Atrium: Left atrial size was normal in size. Right Atrium: Right atrial size was normal in size. Pericardium: A small pericardial effusion is present. Mitral Valve: The mitral valve is normal in structure. Trivial mitral valve regurgitation. No evidence of mitral valve stenosis. Tricuspid Valve: The tricuspid valve is normal in structure. Tricuspid valve regurgitation is trivial. No evidence of tricuspid stenosis. Aortic Valve: The aortic valve is tricuspid. Aortic valve regurgitation is not visualized. No aortic stenosis is present. Pulmonic Valve: The pulmonic valve was normal in structure. Pulmonic valve regurgitation is not visualized. No evidence of pulmonic stenosis. Aorta: The aortic root is normal in size and structure. Venous: The inferior vena cava is normal in size with greater than 50% respiratory variability, suggesting right atrial pressure of 3 mmHg. IAS/Shunts: No atrial level shunt detected by color flow Doppler.  LEFT VENTRICLE PLAX 2D LVIDd:         4.30 cm     Diastology LVIDs:         3.70 cm     LV e' medial:    4.35 cm/s LV PW:         0.70 cm     LV E/e' medial:  23.2 LV IVS:        0.80 cm     LV e' lateral:   6.53 cm/s LVOT diam:     2.00 cm     LV E/e' lateral: 15.5 LV SV:         27 LV SV Index:   16 LVOT Area:     3.14 cm  LV Volumes (MOD) LV vol d, MOD A4C: 71.1 ml LV vol s, MOD A4C: 53.5 ml LV SV MOD A4C:     71.1 ml IVC IVC diam: 1.50 cm LEFT ATRIUM         Index  LA diam:    2.90 cm 1.72 cm/m  AORTIC VALVE LVOT Vmax:   54.10 cm/s LVOT Vmean:  33.800 cm/s LVOT VTI:    0.086 m  AORTA Ao Root diam: 2.60 cm Ao Asc diam:  3.00 cm MITRAL VALVE                TRICUSPID VALVE MV Area (PHT): 5.38 cm     TR Peak grad:   28.1 mmHg MV Decel Time: 141 msec     TR Vmax:        265.00 cm/s MV E velocity: 101.00 cm/s                             SHUNTS  Systemic VTI:  0.09 m                             Systemic Diam: 2.00 cm Skeet Latch MD Electronically signed by Skeet Latch MD Signature Date/Time: 10/02/2020/1:54:13 PM    Final     Microbiology: No results found for this or any previous visit (from the past 240 hour(s)).   Labs: Basic Metabolic Panel: Recent Labs  Lab 10/14/20 1230  NA 136  K 5.6*  CL 104  CO2 27  GLUCOSE 158*  BUN 6  CREATININE 0.90  CALCIUM 8.9   Liver Function Tests: No results for input(s): AST, ALT, ALKPHOS, BILITOT, PROT, ALBUMIN in the last 168 hours. No results for input(s): LIPASE, AMYLASE in the last 168 hours. No results for input(s): AMMONIA in the last 168 hours. CBC: No results for input(s): WBC, NEUTROABS, HGB, HCT, MCV, PLT in the last 168 hours. Cardiac Enzymes: No results for input(s): CKTOTAL, CKMB, CKMBINDEX, TROPONINI in the last 168 hours. BNP: BNP (last 3 results) No results for input(s): BNP in the last 8760 hours.  ProBNP (last 3 results) No results for input(s): PROBNP in the last 8760 hours.  CBG: No results for input(s): GLUCAP in the last 168 hours.  Active Problems:   DKA (diabetic ketoacidosis) (HCC)   Non-ST elevation (NSTEMI) myocardial infarction ([place])   DKA, type 1 ([place])   ARF (acute renal failure) (HCC)   Acute systolic congestive heart failure (HCC)   DCM (dilated cardiomyopathy) ([place])   Takotsubo cardiomyopathy   Time coordinating discharge: 38 minutes  Signed:        Shelton Soler, DO [facility]  10/18/2020, 4:58 PM Discharge to  home. Follow up with PCP I n 7-10 days. Chemistry to be drawn at that visit and reported to PCP Follow up with cardiology [facility] clinic in 2 weeks for up-titration of cardiac medications. Echocardiogram in 2 months to follow EF. Follow up with lipid clinic in 2-4 weeks for consideration of Repatha vs Praluent for lipid control.

## 2020-10-19 NOTE — Progress Notes (Signed)
Cardiology Office Note    Date:  10/26/2020   ID:  Jennifer Hogan, DOB January 10, 1971, MRN [id number]   PCP:  [name], NP   [place]  Cardiologist:  Fransico Him, MD   Advanced Practice Provider:  No care team member to display Electrophysiologist:  None   [phone]   Chief Complaint  Patient presents with   Hospitalization Follow-up     History of Present Illness:  Jennifer Hogan is a 50 y.o. female with a history of CAD, status post coronary artery bypass and graft (LIMA to the LAD/ramus intermedius and RIMA to the obtuse marginal) 2020, DM Type I,  anemia, anxiety, hypothyroidism, and hyperlipidemia.    Admitted 10/01/2020 with DKA and NSTEMI.  Hospital course complicated by acute respiratory failure and pulmonary edema. Had ECHO with reduced EF down to 20-25%. LHC showed severe CAD with total occlusion of the proximal LAD, moderate stenosis of the intermediate branch, and severe ostial circumflex in-stent restenosis, patent RIMA to the circumflex and LAD sequential to the intermediate. Diuresed with IV lasix. Discharged on Toprol Xl and spironolactone. Discharged to home on 10/06/2020.    Patient seen in advanced heart failure clinic 10/14/2020 and was having some lightheadedness.  Was given Lasix as needed, did not start Entresto or change any other meds.  Not a candidate for SGL T2 due to DM2.  Plan for echo in 3 months after heart failure meds optimized.  Patient comes in for f/u. Last week was still having some chest pain into back with a deep breath but that has faded. Now starting to feel better. Never got the Rx for colchicine. Spironolactone stopped b/c of K 5.6. BP 100/60's. She also stopped toprol 25 mg 1/2 daily.   Past Medical History:  Diagnosis Date   Anemia    Anxiety    Coronary artery disease    a. s/p PTCA DES x3 in the LAD (ostial DES placed, mid DES placed, distal DES placed) and PTCA/DES x1 to intermediate branch  02/2017. b. ACS 02/08/18 with LCx stent placement.   Depression    Diabetes mellitus    type 1, on insulin pump   DKA (diabetic ketoacidoses) 12/31/2017   Elevated platelet count    High cholesterol    Hypothyroidism    Ischemic cardiomyopathy    a. EF low-normal with basal inferior akinesis, basal septal hypokinesis by echo 01/2018.    Past Surgical History:  Procedure Laterality Date   APPENDECTOMY     CORONARY ARTERY BYPASS GRAFT N/A 11/04/2018   Procedure: CORONARY ARTERY BYPASS GRAFTING (CABG), ON PUMP, TIMES THREE, USING LEFT AND RIGHT INTERNAL MAMMARY ARTERIES;  Surgeon: Wonda Olds, MD;  Location: [place];  Service: Open Heart Surgery;  Laterality: N/A;   CORONARY STENT INTERVENTION N/A 03/20/2017   Procedure: DES x 3 LAD, DES OM; Surgeon: Burnell Blanks, MD;  Location: [place] [facility];  Service: Cardiovascular;  Laterality: N/A;   CORONARY/GRAFT ACUTE MI REVASCULARIZATION N/A 02/08/2018   Procedure: Coronary/Graft Acute MI Revascularization;  Surgeon: Belva Crome, MD;  Location: [place] [facility];  Service: Cardiovascular;  Laterality: N/A;   LEFT HEART CATH AND CORONARY ANGIOGRAPHY N/A 03/20/2017   Procedure: LEFT HEART CATH AND CORONARY ANGIOGRAPHY;  Surgeon: Burnell Blanks, MD;  Location: [place] [facility];  Service: Cardiovascular;  Laterality: N/A;   LEFT HEART CATH AND CORONARY ANGIOGRAPHY N/A 02/08/2018   Procedure: LEFT HEART CATH AND CORONARY ANGIOGRAPHY;  Surgeon:  Belva Crome, MD;  Location: [place] [facility];  Service: Cardiovascular;  Laterality: N/A;   LEFT HEART CATH AND CORONARY ANGIOGRAPHY N/A 10/29/2018   Procedure: LEFT HEART CATH AND CORONARY ANGIOGRAPHY;  Surgeon: Burnell Blanks, MD;  Location: [place] [facility];  Service: Cardiovascular;  Laterality: N/A;   LEFT HEART CATH AND CORS/GRAFTS ANGIOGRAPHY N/A 10/04/2020   Procedure: LEFT HEART CATH AND CORS/GRAFTS ANGIOGRAPHY;  Surgeon: Sherren Mocha, MD;  Location: [place]  [facility];  Service: Cardiovascular;  Laterality: N/A;   TEE WITHOUT CARDIOVERSION N/A 11/04/2018   Procedure: TRANSESOPHAGEAL ECHOCARDIOGRAM (TEE);  Surgeon: Wonda Olds, MD;  Location: [place];  Service: Open Heart Surgery;  Laterality: N/A;    Current Medications: Current Meds  Medication Sig   acetaminophen (TYLENOL) 325 MG tablet Take 650 mg by mouth daily as needed (couch/pain/fever).   albuterol (VENTOLIN HFA) 108 (90 Base) MCG/ACT inhaler INHALE 2 PUFFS INTO THE LUNGS EVERY 6 (SIX) HOURS AS NEEDED FOR WHEEZING OR SHORTNESS OF BREATH.   aspirin 81 MG EC tablet Take 1 tablet (81 mg total) by mouth daily.   atorvastatin (LIPITOR) 20 MG tablet Take 1 tablet (20 mg total) by mouth daily.   b complex vitamins tablet Take 1 tablet by mouth daily.   budesonide (PULMICORT) 0.25 MG/2ML nebulizer solution Take 2 mLs (0.25 mg total) by nebulization 2 (two) times daily.   clopidogrel (PLAVIX) 75 MG tablet TAKE 1 TABLET BY MOUTH DAILY WITH BREAKFAST.   diclofenac Sodium (VOLTAREN) 1 % GEL Apply 2 g topically 4 (four) times daily.   FLUoxetine (PROZAC) 40 MG capsule TAKE 1 CAPSULE BY MOUTH DAILY.   fluticasone (FLONASE) 50 MCG/ACT nasal spray Place 1 spray into both nostrils at bedtime as needed for allergies.   furosemide (LASIX) 20 MG tablet Take 1 tablet (20 mg total) by mouth daily as needed (for weight gain).   hydrOXYzine (ATARAX/VISTARIL) 25 MG tablet Take 1 tablet (25 mg total) by mouth 3 (three) times daily as needed.   Insulin Human (INSULIN PUMP) SOLN Inject into the skin continuous. Humalog   insulin lispro (HUMALOG) 100 UNIT/ML injection Inject 35 units into the skin daily via pump   ipratropium (ATROVENT) 0.06 % nasal spray Place 2 sprays into both nostrils 2 (two) times daily as needed for rhinitis.   levothyroxine (SYNTHROID) 100 MCG tablet TAKE 1 TABLET (100 MCG TOTAL) BY MOUTH DAILY BEFORE BREAKFAST.   metoCLOPramide (REGLAN) 5 MG tablet Take 1 tablet (5 mg total) by mouth every  12 (twelve) hours as needed for nausea or vomiting.   metoprolol succinate (TOPROL-XL) 25 MG 24 hr tablet Take 0.5 tablets (12.5 mg total) by mouth daily.   montelukast (SINGULAIR) 10 MG tablet TAKE 1 TABLET (10 MG TOTAL) BY MOUTH AT BEDTIME.   nitroGLYCERIN (NITROSTAT) 0.4 MG SL tablet Place 1 tablet (0.4 mg total) under the tongue every 5 (five) minutes as needed for chest pain.   ondansetron (ZOFRAN-ODT) 4 MG disintegrating tablet TAKE 1 TABLET (4 MG TOTAL) BY MOUTH EVERY 8 (EIGHT) HOURS AS NEEDED FOR NAUSEA OR VOMITING.   pantoprazole (PROTONIX) 40 MG tablet Take 1 tablet (40 mg total) by mouth 2 (two) times daily as needed (acid reflux).   spironolactone (ALDACTONE) 25 MG tablet Take 0.5 tablets (12.5 mg total) by mouth daily.   sucralfate (CARAFATE) 1 GM/10ML suspension Take 10 mLs (1 g total) by mouth 4 (four) times daily -  with meals and at bedtime.     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"   Occupation: adjunct facilty at [employer]    Comment: Chemistry professor  Tobacco Use   Smoking status: Never   Smokeless tobacco: Never   Tobacco comments:    Use marijuana  Vaping Use   Vaping Use: Never used  Substance and Sexual Activity   Alcohol use: No    Alcohol/week: 0.0 standard drinks   Drug use: Not Currently    Types: Marijuana    Comment: last use last week, occaisonal marijuana use   Sexual activity: Not Currently    Birth control/protection: None  Other Topics Concern   Not on file  Social History Narrative    Not on file   Social Determinants of Health   Financial Resource Strain: High Risk   Difficulty of Paying Living Expenses: Very hard  Food Insecurity: Food Insecurity Present   Worried About Running Out of Food in the Last Year: Sometimes true   Ran Out of Food in the Last Year: Sometimes true  Transportation Needs: No Transportation Needs   Lack of Transportation (Medical): No   Lack of Transportation (Non-Medical): No  Physical Activity: Not on file  Stress: Not on file  Social Connections: Not on file     Family History:  The patient's  family history includes Asthma in her father; Cancer in her mother; Congestive Heart Failure in her father; Coronary artery disease in her father.   ROS:   Please see the history of present illness.    ROS All other systems reviewed and are negative.   PHYSICAL EXAM:   VS:  BP (!) 102/54   Pulse 86   Ht 5\' 5"  (1.651 m)   Wt 139 lb 9.6 oz (63.3 kg)   SpO2 98%   BMI 23.23 kg/m   Physical Exam  GEN: Thin, in no acute distress  Neck: no JVD, carotid bruits, or masses Cardiac:RRR; no murmurs, rubs, or gallops  Respiratory:  clear to auscultation bilaterally, normal work of breathing GI: soft, nontender, nondistended, + BS Ext: without cyanosis, clubbing, or edema, Good distal pulses bilaterally Neuro:  Alert and Oriented x 3, Psych: euthymic mood, full affect  Wt Readings from Last 3 Encounters:  10/26/20 139 lb 9.6 oz (63.3 kg)  10/14/20 137 lb 9.6 oz (62.4 kg)  10/11/20 131 lb 9.6 oz (59.7 kg)      Studies/Labs Reviewed:   EKG:  EKG is not ordered today.     Recent Labs: 07/30/2020: ALT 18; TSH 1.150 10/04/2020: Magnesium 1.9 10/06/2020: Hemoglobin 12.7; Platelets 465 10/14/2020: BUN 6; Creatinine, Ser 0.90; Potassium 5.6; Sodium 136   Lipid Panel    Component Value Date/Time   CHOL 256 (H) 07/30/2020 1153   TRIG 119 07/30/2020 1153   HDL 69 07/30/2020 1153   CHOLHDL 3.7 07/30/2020 1153   CHOLHDL 3.2 02/08/2018 0517   VLDL  21 02/08/2018 0517   LDLCALC 166 (H) 07/30/2020 1153   LDLDIRECT 109 (H) 12/22/2019 1408    Additional studies/ records that were reviewed today include:  Cath 09/2020  Mid LAD lesion is 60% stenosed.   Ost Cx to Prox Cx lesion is 99% stenosed.   Prox Cx to Mid Cx lesion is 70% stenosed.   Ramus-1 lesion is 60% stenosed.   Ramus-2 lesion is 20% stenosed.   Ramus-3 lesion is 70% stenosed.   Prox LAD to Mid LAD lesion is 100% stenosed.   Dist LAD-1 lesion is 70% stenosed.   Dist LAD-2 lesion is 70% stenosed.   Non-stenotic Mid LAD to Dist LAD lesion was previously treated.   RIMA graft was visualized by angiography and is small.   LIMA and is large.   The graft exhibits no disease.   LV end diastolic pressure is normal.  Severe native vessel CAD with total occlusion of the proximal LAD, moderate stenosis of the intermediate branch, and severe ostial circumflex in-stent restenosis S/P CABG with patency of the both the RIMA-circumflex and LAD sequential to the intermediate  3. Normal LVEDP   Recommend: continued medical therapy   Risk Assessment/Calculations:         ASSESSMENT:    1. Coronary artery disease involving native coronary artery of native heart without angina pectoris   2. Ischemic cardiomyopathy   3. Type 1 diabetes mellitus with complications ([place])   4. Hyperlipidemia LDL goal <70      PLAN:  In order of problems listed above:  CAD with prior CABG left heart cath 09/2020 severe CAD with total occlusion of the proximal LAD moderate stenosis of the IM and severe ostial circumflex in-stent restenosis medical therapy. Chest pain sounded like pericarditis but now resolved. Continue ASA/Plavix/lipitor  Ischemic cardiomyopathy EF 20 to 25% on echo 09/2020 not taking Toprol-XL because her bp was running low and she stopped it. Try to restart low dose and keep f/u with AHF. Spironolactone stopped due to high K .  Could not start Entresto b/c lightheaded  DM2  uncontrolled managed by Dr. Loanne Drilling  HLD LDL 166 in 07/2020 not on lipitor-now on it since hosptila. Will need repeat FLP in 3 months.  Shared Decision Making/Informed Consent        Medication Adjustments/Labs and Tests Ordered: Current medicines are reviewed at length with the patient today.  Concerns regarding medicines are outlined above.  Medication changes, Labs and Tests ordered today are listed in the Patient Instructions below. Patient Instructions  Medication Instructions:  Your physician recommends that you continue on your current medications as directed. Please refer to the Current Medication list given to you today.  *If you need a refill on your cardiac medications before your next appointment, please call your pharmacy*   Lab Work: Your physician recommends that you return for a FASTING lipid profile on December 5.  If you have labs (blood work) drawn today and your tests are completely normal, you will receive your results only by: [place] (if you have MyChart) OR A paper copy in the mail If you have any lab test that is abnormal or we need to change your treatment, we will call you to review the results.   Follow-Up: At [facility], you and your health needs are our priority.  As part of our continuing mission to provide you with exceptional heart care, we have created designated Provider Care Teams.  These Care Teams include your primary Cardiologist (physician) and Advanced Practice Providers (APPs -  Physician Assistants and Nurse Practitioners) who all work together to provide you with the care you need, when you need it.   Your next appointment:  4 month(s)  The format for your next appointment:   In Person  Provider:   You may see Fransico Him, MD or one of the following Advanced Practice Providers on your designated Care Team:   Melina Copa, PA-C Ermalinda Barrios, PA-C      Signed, Ermalinda Barrios, PA-C  10/26/2020 12:53 PM    [address], [place], [place]  [address] Phone: [phone]; Fax: [phone]

## 2020-10-20 ENCOUNTER — Other Ambulatory Visit: Payer: Self-pay

## 2020-10-20 ENCOUNTER — Other Ambulatory Visit: Payer: Self-pay | Admitting: Nurse Practitioner

## 2020-10-20 DIAGNOSIS — J9801 Acute bronchospasm: Secondary | ICD-10-CM

## 2020-10-20 MED ORDER — MONTELUKAST SODIUM 10 MG PO TABS
ORAL_TABLET | Freq: Every day | ORAL | 0 refills | Status: DC
  Filled 2020-10-20: qty 30, 30d supply, fill #0

## 2020-10-20 MED FILL — Levothyroxine Sodium Tab 100 MCG: ORAL | 30 days supply | Qty: 30 | Fill #4 | Status: AC

## 2020-10-20 NOTE — Telephone Encounter (Signed)
Appointment 10/29/20.

## 2020-10-21 ENCOUNTER — Other Ambulatory Visit: Payer: Self-pay

## 2020-10-25 ENCOUNTER — Other Ambulatory Visit: Payer: Self-pay

## 2020-10-25 ENCOUNTER — Encounter: Payer: Medicaid Other | Attending: Endocrinology | Admitting: Nutrition

## 2020-10-25 DIAGNOSIS — E1065 Type 1 diabetes mellitus with hyperglycemia: Secondary | ICD-10-CM | POA: Insufficient documentation

## 2020-10-26 ENCOUNTER — Other Ambulatory Visit: Payer: Self-pay

## 2020-10-26 ENCOUNTER — Ambulatory Visit (INDEPENDENT_AMBULATORY_CARE_PROVIDER_SITE_OTHER): Payer: Self-pay | Admitting: Physician Assistant

## 2020-10-26 ENCOUNTER — Encounter: Payer: Self-pay | Admitting: Physician Assistant

## 2020-10-26 VITALS — BP 102/54 | HR 86 | Ht 65.0 in | Wt 139.6 lb

## 2020-10-26 DIAGNOSIS — E108 Type 1 diabetes mellitus with unspecified complications: Secondary | ICD-10-CM

## 2020-10-26 DIAGNOSIS — E785 Hyperlipidemia, unspecified: Secondary | ICD-10-CM

## 2020-10-26 DIAGNOSIS — I255 Ischemic cardiomyopathy: Secondary | ICD-10-CM

## 2020-10-26 DIAGNOSIS — I2510 Atherosclerotic heart disease of native coronary artery without angina pectoris: Secondary | ICD-10-CM

## 2020-10-26 NOTE — Patient Instructions (Addendum)
Medication Instructions:  Your physician recommends that you continue on your current medications as directed. Please refer to the Current Medication list given to you today.  *If you need a refill on your cardiac medications before your next appointment, please call your pharmacy*   Lab Work: Your physician recommends that you return for a FASTING lipid profile on December 5.  If you have labs (blood work) drawn today and your tests are completely normal, you will receive your results only by: [place] (if you have MyChart) OR A paper copy in the mail If you have any lab test that is abnormal or we need to change your treatment, we will call you to review the results.   Follow-Up: At [facility], you and your health needs are our priority.  As part of our continuing mission to provide you with exceptional heart care, we have created designated Provider Care Teams.  These Care Teams include your primary Cardiologist (physician) and Advanced Practice Providers (APPs -  Physician Assistants and Nurse Practitioners) who all work together to provide you with the care you need, when you need it.   Your next appointment:   4 month(s)  The format for your next appointment:   In Person  Provider:   You may see Fransico Him, MD or one of the following Advanced Practice Providers on your designated Care Team:   Melina Copa, PA-C Ermalinda Barrios, PA-C

## 2020-10-26 NOTE — Patient Instructions (Signed)
Calibrate sensor before bed each night to prevent some of the alarms every night.  call me with help to shut off some of these alarms during the night.

## 2020-10-26 NOTE — Progress Notes (Signed)
Patient reported that she stopped using the CGM with her Medtronic pump due to cost, and alarms that kept going off "all night", and other difficulties with calibration alarms. Discussed why these are going off, and what can be done to eliminate most of these.  Also discussed the advantages of having the Medtronic sensors and using the auto mode.  She was also shown other pumps with use of the Dexcom sensors, as well at the problems with these sensors.  She agreed to try the Medtronic sensors again, for now and was told to call me back and I can talk her into setting these high and low alerts with timings used to not wake her up during the night.  She had no final questions. Pamphlets given on Tandem pump and OmniPod pumps  as well.

## 2020-10-27 ENCOUNTER — Other Ambulatory Visit: Payer: Self-pay

## 2020-10-28 ENCOUNTER — Other Ambulatory Visit: Payer: Self-pay

## 2020-10-29 ENCOUNTER — Other Ambulatory Visit: Payer: Self-pay | Admitting: Nurse Practitioner

## 2020-10-29 ENCOUNTER — Other Ambulatory Visit: Payer: Self-pay

## 2020-10-29 ENCOUNTER — Encounter: Payer: Self-pay | Admitting: Nurse Practitioner

## 2020-10-29 ENCOUNTER — Ambulatory Visit: Payer: Self-pay | Attending: Nurse Practitioner | Admitting: Nurse Practitioner

## 2020-10-29 DIAGNOSIS — R6889 Other general symptoms and signs: Secondary | ICD-10-CM

## 2020-10-29 NOTE — Progress Notes (Signed)
Virtual Visit Note Due to national recommendations of social distancing due to Remer 19, virtual visit is felt to be most appropriate for this patient at this time.  I discussed the limitations, risks, security and privacy concerns of performing an evaluation and management service by video and the availability of in person appointments. I also discussed with the patient that there may be a patient responsible charge related to this service. The patient expressed understanding and agreed to proceed.    I connected with Jennifer Hogan on 10/29/20  at   9:30 AM EDT  EDT by VIDEO and verified that I am speaking with the correct person using two identifiers.   Location of Patient: Private Residence   Location of Provider: [place] [facility] [place] participating in VIRTUAL visit: Jennifer Rankins FNP-BC [place]    History of Present Illness: VIRTUAL visit for: Viral Symptoms  Upper Respiratory Infection: Patient complains of symptoms of a URI. Symptoms include fatigue, congestion, cough, fever, sore throat, and swollen glands. Onset of symptoms was a few days ago, gradually worsening since that time. She also c/o nasal congestion and sneezing  Evaluation to date: none. Treatment to date: none. 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 She did take a COVID test  (BINAX) on this virtual video visit which was negative     Past Medical History:  Diagnosis Date   Anemia    Anxiety    Coronary artery disease    a. s/p PTCA DES x3 in the LAD (ostial DES placed, mid DES placed, distal DES placed) and PTCA/DES x1 to intermediate branch 02/2017. b. ACS 02/08/18 with LCx stent placement.   Depression    Diabetes mellitus    type 1, on insulin pump   DKA (diabetic ketoacidoses) 12/31/2017   Elevated platelet count    High cholesterol    Hypothyroidism    Ischemic cardiomyopathy    a. EF low-normal with  basal inferior akinesis, basal septal hypokinesis by echo 01/2018.    Past Surgical History:  Procedure Laterality Date   APPENDECTOMY     CORONARY ARTERY BYPASS GRAFT N/A 11/04/2018   Procedure: CORONARY ARTERY BYPASS GRAFTING (CABG), ON PUMP, TIMES THREE, USING LEFT AND RIGHT INTERNAL MAMMARY ARTERIES;  Surgeon: Wonda Olds, MD;  Location: [place];  Service: Open Heart Surgery;  Laterality: N/A;   CORONARY STENT INTERVENTION N/A 03/20/2017   Procedure: DES x 3 LAD, DES OM; Surgeon: Burnell Blanks, MD;  Location: [place] [facility];  Service: Cardiovascular;  Laterality: N/A;   CORONARY/GRAFT ACUTE MI REVASCULARIZATION N/A 02/08/2018   Procedure: Coronary/Graft Acute MI Revascularization;  Surgeon: Belva Crome, MD;  Location: [place] [facility];  Service: Cardiovascular;  Laterality: N/A;   LEFT HEART CATH AND CORONARY ANGIOGRAPHY N/A 03/20/2017   Procedure: LEFT HEART CATH AND CORONARY ANGIOGRAPHY;  Surgeon: Burnell Blanks, MD;  Location: [place] [facility];  Service: Cardiovascular;  Laterality: N/A;   LEFT HEART CATH AND CORONARY ANGIOGRAPHY N/A 02/08/2018   Procedure: LEFT HEART CATH AND CORONARY ANGIOGRAPHY;  Surgeon: Belva Crome, MD;  Location: [place] [facility];  Service: Cardiovascular;  Laterality: N/A;   LEFT HEART CATH AND CORONARY ANGIOGRAPHY N/A 10/29/2018   Procedure: LEFT HEART CATH AND CORONARY ANGIOGRAPHY;  Surgeon: Burnell Blanks, MD;  Location: [place] [facility];  Service: Cardiovascular;  Laterality: N/A;   LEFT HEART CATH  AND CORS/GRAFTS ANGIOGRAPHY N/A 10/04/2020   Procedure: LEFT HEART CATH AND CORS/GRAFTS ANGIOGRAPHY;  Surgeon: Sherren Mocha, MD;  Location: [place] [facility];  Service: Cardiovascular;  Laterality: N/A;   TEE WITHOUT CARDIOVERSION N/A 11/04/2018   Procedure: TRANSESOPHAGEAL ECHOCARDIOGRAM (TEE);  Surgeon: Wonda Olds, MD;  Location: [place];  Service: Open Heart Surgery;  Laterality: N/A;    Family History  Problem  Relation Age of Onset   Cancer Mother        T cell lymphoma   Coronary artery disease Father    Congestive Heart Failure Father    Asthma Father     Social History   Socioeconomic History   Marital status: Divorced    Spouse name: Not on file   Number of children: Not on file   Years of education: Not on file   Highest education level: Professional school degree (e.g., MD, DDS, DVM, JD)  Occupational History   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"   Occupation: adjunct facilty at [employer]    Comment: Chemistry professor  Tobacco Use   Smoking status: Never   Smokeless tobacco: Never   Tobacco comments:    Use marijuana  Vaping Use   Vaping Use: Never used  Substance and Sexual Activity   Alcohol use: No    Alcohol/week: 0.0 standard drinks   Drug use: Not Currently    Types: Marijuana    Comment: last use last week, occaisonal marijuana use   Sexual activity: Not Currently    Birth control/protection: None  Other Topics Concern   Not on file  Social History Narrative   Not on file   Social Determinants of Health   Financial Resource Strain: High Risk   Difficulty of Paying Living Expenses: Very hard  Food Insecurity: Food Insecurity Present   Worried About Running Out of Food in the Last Year: Sometimes true   Ran Out of Food in the Last Year: Sometimes true  Transportation Needs: No Transportation Needs   Lack of Transportation (Medical): No   Lack of Transportation (Non-Medical): No  Physical Activity: Not on file  Stress: Not on file  Social Connections: Not on file     Observations/Objective: Awake, alert and oriented x 3   Review of Systems  Constitutional:  Positive for chills, fever and malaise/fatigue.  HENT:  Positive for congestion, sinus pain and sore throat (With tonsillar lymphadenopathy). Negative for ear discharge, ear pain and hearing loss.   Eyes: Negative.   Respiratory:  Positive for cough. Negative for sputum production, shortness of  breath and wheezing.   Cardiovascular: Negative.  Negative for chest pain, orthopnea and leg swelling.  Gastrointestinal: Negative.  Negative for abdominal pain, diarrhea, nausea and vomiting.  Neurological:  Positive for headaches. Negative for dizziness and focal weakness.  Endo/Heme/Allergies:  Negative for environmental allergies.  Psychiatric/Behavioral: Negative.     Assessment and Plan: Diagnoses and all orders for this visit:  Flu-like symptoms At this time she would like to continue conservative home management with Neti pot, peppermint tea and Tylenol She declines Tamiflu for now  Follow Up Instructions Return if symptoms worsen or fail to improve.     I discussed the assessment and treatment plan with the patient. The patient was provided an opportunity to ask questions and all were answered. The patient agreed with the plan and demonstrated an understanding of the instructions.   The patient was advised to call back or seek an in-person evaluation if the symptoms  worsen or if the condition fails to improve as anticipated.  I provided 15 minutes of face-to-face time during this encounter including median intraservice time, reviewing previous notes, labs, imaging, medications and explaining diagnosis and management.  [name], FNP-BC

## 2020-11-01 ENCOUNTER — Other Ambulatory Visit: Payer: Self-pay

## 2020-11-01 ENCOUNTER — Other Ambulatory Visit: Payer: Self-pay | Admitting: Cardiology

## 2020-11-01 MED ORDER — CLOPIDOGREL BISULFATE 75 MG PO TABS
ORAL_TABLET | ORAL | 3 refills | Status: DC
  Filled 2020-11-01: qty 30, 30d supply, fill #0
  Filled 2020-12-01: qty 30, 30d supply, fill #1
  Filled 2020-12-30 – 2021-01-13 (×2): qty 30, 30d supply, fill #2
  Filled 2021-02-14: qty 30, 30d supply, fill #0
  Filled 2021-04-02: qty 30, 30d supply, fill #1
  Filled 2021-05-19: qty 30, 30d supply, fill #2
  Filled 2021-06-23: qty 30, 30d supply, fill #3
  Filled 2021-08-10: qty 30, 30d supply, fill #4
  Filled 2021-09-21: qty 30, 30d supply, fill #5
  Filled 2021-10-26: qty 30, 30d supply, fill #6

## 2020-11-02 ENCOUNTER — Other Ambulatory Visit: Payer: Self-pay

## 2020-11-02 NOTE — Progress Notes (Addendum)
Advanced Heart Failure Clinic Note    PCP: [name], NP PCP-Cardiologist: Fransico Him, MD  HF Cardiologist: Dr. Haroldine Laws  HPI: Jennifer Hogan is a 50 y.o.with a history of recenlty diagnosed HFrEF, CAD, status post coronary artery bypass and graft (LIMA to the LAD/ramus intermedius and RIMA to the obtuse marginal), DM Type I,  anemia, anxiety, hypothyroidism, and hyperlipidemia.    Admitted 10/01/2020 with DKA and NSTEMI.  Hospital course complicated by acute respiratory failure and pulmonary edema. Had ECHO with reduced EF down to 20-25%. LHC showed severe CAD with total occlusion of the proximal LAD, moderate stenosis of the intermediate branch, and severe ostial circumflex in-stent restenosis. Diuresed with IV lasix. Discharged on Toprol Xl and spironolactone. Discharged to home on 10/06/2020.   Seen in [facility] clinic post discharge, some SOB and fatigue and light-headed. GDMT titrated.  Today she returns for HF follow up. Has been taking metoprolol every other day. Does not have much SOB but is not pushing herself physically. Does feel tired and difficulty lifting. Denies CP, dizziness, edema, or PND/Orthopnea. Appetite ok. No fever or chills. Weight at home 139-140 pounds, but does not weigh regularly. She has horses and tries to get outside and walk some. 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      Cardiac Testing  Echo 09/2020 EF 20-25%  Echo 11/2018 EF 55-60%    Cath 09/2020   Mid LAD lesion is 60% stenosed.   Ost Cx to Prox Cx lesion is 99% stenosed.   Prox Cx to Mid Cx lesion is 70% stenosed.   Ramus-1 lesion is 60% stenosed.   Ramus-2 lesion is 20% stenosed.   Ramus-3 lesion is 70% stenosed.   Prox LAD to Mid LAD lesion is 100% stenosed.   Dist LAD-1 lesion is 70% stenosed.   Dist LAD-2 lesion is 70% stenosed.   Non-stenotic Mid LAD to Dist LAD lesion was previously treated.   RIMA graft was visualized by angiography and is small.   LIMA and is  large.   The graft exhibits no disease.   LV end diastolic pressure is normal.  Severe native vessel CAD with total occlusion of the proximal LAD, moderate stenosis of the intermediate branch, and severe ostial circumflex in-stent restenosis S/P CABG with patency of the both the RIMA-circumflex and LAD sequential to the intermediate   Review of Systems: [y] = yes, [ ]  = no   General: Weight gain [ ] ; Weight loss [ ] ; Anorexia [ ] ; Fatigue [y]; Fever [ ] ; Chills [ ] ; Weakness Jennifer Hogan ]  Cardiac: Chest pain/pressure [ ] ; Resting SOB [ ] ; Exertional SOB [ y]; Orthopnea [ ] ; Pedal Edema [ ] ; Palpitations [ ] ; Syncope [ ] ; Presyncope [ ] ; Paroxysmal nocturnal dyspnea[ ]   Pulmonary: Cough [ ] ; Wheezing[ ] ; Hemoptysis[ ] ; Sputum [ ] ; Snoring [ ]   GI: Vomiting[ ] ; Dysphagia[ ] ; Melena[ ] ; Hematochezia [ ] ; Heartburn[ ] ; Abdominal pain [ ] ; Constipation [ ] ; Diarrhea [ ] ; BRBPR [ ]   GU: Hematuria[ ] ; Dysuria [ ] ; Nocturia[ ]   Vascular: Pain in legs with walking [ ] ; Pain in feet with lying flat [ ] ; Non-healing sores [ ] ; Stroke [ ] ; TIA [ ] ; Slurred speech [ ] ;  Neuro: Headaches[ ] ; Vertigo[ ] ; Seizures[ ] ; Paresthesias[ ] ;Blurred vision [ ] ; Diplopia [ ] ; Vision changes [ ]   Ortho/Skin: Arthritis [ ] ; Joint pain [ ] ; Muscle pain [ ] ; Joint swelling [ ] ; Back Pain [ ] ; Rash [ ]   Psych: Depression[ ] ; Anxiety[y ]  Heme: Bleeding problems [ ] ; Clotting disorders [ ] ; Anemia [ ]   Endocrine: Diabetes Jennifer Hogan ]; Thyroid dysfunction[y ]  Past Medical History:  Diagnosis Date   Anemia    Anxiety    Coronary artery disease    a. s/p PTCA DES x3 in the LAD (ostial DES placed, mid DES placed, distal DES placed) and PTCA/DES x1 to intermediate branch 02/2017. b. ACS 02/08/18 with LCx stent placement.   Depression    Diabetes mellitus    type 1, on insulin pump   DKA (diabetic ketoacidoses) 12/31/2017   Elevated platelet count    High cholesterol    Hypothyroidism    Ischemic cardiomyopathy    a. EF low-normal  with basal inferior akinesis, basal septal hypokinesis by echo 01/2018.   Current Outpatient Medications  Medication Sig Dispense Refill   acetaminophen (TYLENOL) 325 MG tablet Take 650 mg by mouth daily as needed (couch/pain/fever).     albuterol (VENTOLIN HFA) 108 (90 Base) MCG/ACT inhaler INHALE 2 PUFFS INTO THE LUNGS EVERY 6 (SIX) HOURS AS NEEDED FOR WHEEZING OR SHORTNESS OF BREATH. 8.5 g 2   aspirin 81 MG EC tablet Take 1 tablet (81 mg total) by mouth daily. 90 tablet 0   atorvastatin (LIPITOR) 20 MG tablet Take 1 tablet (20 mg total) by mouth daily. 30 tablet 0   b complex vitamins tablet Take 1 tablet by mouth daily.     budesonide (PULMICORT) 0.25 MG/2ML nebulizer solution Take 2 mLs (0.25 mg total) by nebulization 2 (two) times daily. (Patient taking differently: Take 0.25 mg by nebulization 2 (two) times daily. As needed) 60 mL 12   clopidogrel (PLAVIX) 75 MG tablet TAKE 1 TABLET BY MOUTH DAILY WITH BREAKFAST. 90 tablet 3   diclofenac Sodium (VOLTAREN) 1 % GEL Apply 2 g topically 4 (four) times daily. (Patient taking differently: Apply 2 g topically 4 (four) times daily. As needed) 200 g 3   FLUoxetine (PROZAC) 40 MG capsule TAKE 1 CAPSULE BY MOUTH DAILY. 90 capsule 1   fluticasone (FLONASE) 50 MCG/ACT nasal spray Place 1 spray into both nostrils at bedtime as needed for allergies.     furosemide (LASIX) 20 MG tablet Take 1 tablet (20 mg total) by mouth daily as needed (for weight gain). 15 tablet 3   hydrOXYzine (ATARAX/VISTARIL) 25 MG tablet Take 1 tablet (25 mg total) by mouth 3 (three) times daily as needed. 60 tablet 1   Insulin Human (INSULIN PUMP) SOLN Inject into the skin continuous. Humalog     insulin lispro (HUMALOG) 100 UNIT/ML injection Inject 35 units into the skin daily via pump 10 mL 1   ipratropium (ATROVENT) 0.06 % nasal spray Place 2 sprays into both nostrils 2 (two) times daily as needed for rhinitis.     levothyroxine (SYNTHROID) 100 MCG tablet TAKE 1 TABLET (100 MCG  TOTAL) BY MOUTH DAILY BEFORE BREAKFAST. 90 tablet 3   metoCLOPramide (REGLAN) 5 MG tablet Take 1 tablet (5 mg total) by mouth every 12 (twelve) hours as needed for nausea or vomiting. 90 tablet 1   metoprolol succinate (TOPROL-XL) 25 MG 24 hr tablet Take 0.5 tablets (12.5 mg total) by mouth daily. 15 tablet 0   montelukast (SINGULAIR) 10 MG tablet TAKE 1 TABLET (10 MG TOTAL) BY MOUTH AT BEDTIME. 30 tablet 0   nitroGLYCERIN (NITROSTAT) 0.4 MG SL tablet Place 1 tablet (0.4 mg total) under the tongue every 5 (five) minutes as needed for chest pain. 25  tablet 6   ondansetron (ZOFRAN-ODT) 4 MG disintegrating tablet TAKE 1 TABLET (4 MG TOTAL) BY MOUTH EVERY 8 (EIGHT) HOURS AS NEEDED FOR NAUSEA OR VOMITING. 20 tablet 1   pantoprazole (PROTONIX) 40 MG tablet Take 1 tablet (40 mg total) by mouth 2 (two) times daily as needed (acid reflux). 60 tablet 3   sucralfate (CARAFATE) 1 GM/10ML suspension Take 10 mLs (1 g total) by mouth 4 (four) times daily -  with meals and at bedtime. 420 mL 0   No current facility-administered medications for this encounter.   Allergies  Allergen Reactions   Atorvastatin Other (See Comments)    Myalgias on 80mg  dose, tolerates 20mg  ok   Crestor [Rosuvastatin Calcium] Other (See Comments)    Severe myalgias and joint pain. Has tolerated atorvastatin though   Monosodium Glutamate Nausea And Vomiting and Other (See Comments)    migraine   Peanut-Containing Drug Products Other (See Comments)    Vomiting, upset stomach and some wheezing   Zetia [Ezetimibe] Other (See Comments)    myalgias   Ciprofloxin Hcl [Ciprofloxacin] Nausea And Vomiting and Other (See Comments)    Severe migraine   Food Color Red [Red Dye] Diarrhea   Social History   Socioeconomic History   Marital status: Divorced    Spouse name: Not on file   Number of children: Not on file   Years of education: Not on file   Highest education level: Professional school degree (e.g., MD, DDS, DVM, JD)   Occupational History   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"   Occupation: adjunct facilty at [employer]    Comment: Chemistry professor  Tobacco Use   Smoking status: Never   Smokeless tobacco: Never   Tobacco comments:    Use marijuana  Vaping Use   Vaping Use: Never used  Substance and Sexual Activity   Alcohol use: No    Alcohol/week: 0.0 standard drinks   Drug use: Not Currently    Types: Marijuana    Comment: last use last week, occaisonal marijuana use   Sexual activity: Not Currently    Birth control/protection: None  Other Topics Concern   Not on file  Social History Narrative   Not on file   Social Determinants of Health   Financial Resource Strain: High Risk   Difficulty of Paying Living Expenses: Very hard  Food Insecurity: Food Insecurity Present   Worried About Running Out of Food in the Last Year: Sometimes true   Ran Out of Food in the Last Year: Sometimes true  Transportation Needs: No Transportation Needs   Lack of Transportation (Medical): No   Lack of Transportation (Non-Medical): No  Physical Activity: Not on file  Stress: Not on file  Social Connections: Not on file  Intimate Partner Violence: Not on file   Family History  Problem Relation Age of Onset   Cancer Mother        T cell lymphoma   Coronary artery disease Father    Congestive Heart Failure Father    Asthma Father    BP 108/76   Pulse 73   Wt 64 kg (141 lb 3.2 oz)   SpO2 100%   BMI 23.50 kg/m   Wt Readings from Last 3 Encounters:  11/03/20 64 kg (141 lb 3.2 oz)  10/26/20 63.3 kg (139 lb 9.6 oz)  10/14/20 62.4 kg (137 lb 9.6 oz)   PHYSICAL EXAM: General:  NAD. No resp difficulty HEENT: Normal Neck: Supple. No JVD. Carotids 2+ bilat;  no bruits. No lymphadenopathy or thryomegaly appreciated. Cor: PMI nondisplaced. Regular rate & rhythm. No rubs, gallops or murmurs. Lungs: Clear Abdomen: Soft, nontender, nondistended. No hepatosplenomegaly. No bruits or masses. Good bowel  sounds. Insulin pump in place. Extremities: No cyanosis, clubbing, rash, edema Neuro: Alert & oriented x 3, cranial nerves grossly intact. Moves all 4 extremities w/o difficulty. Affect pleasant.  ASSESSMENT & PLAN: Chronic HFeEF, ICM - Echo in 2020 showed EF 55-60%.  - Most recent Echo 09/2020 EF 20-25%.  - LHC with 09/2020 with severe coronary disease. total occlusion of the proximal LAD, moderate stenosis of the intermediate branch, and severe ostial circumflex in-stent restenosis.  - NYHA II-early III. Volume status stable. Can use lasix PRN. - Continue 12.5 mg Toprol XL daily, needs to take this daily. - Will not start ARN/ARNi today as she has not been taking beta blocker regularly. - Continue 12.5 mg spironolactone daily.  - Not a candidate for SGLT2 due to DMI  - Discussed weighing daily and recording. - Plan to repeat ECHO in 3 months after HF meds optimized. Narrow QRS. - BMET today.   2. CAD  - H/O CABG. LHC 09/2020 Severe coronary disease with total occlusion of the proximal LAD, moderate stenosis of the intermediate branch, and severe ostial circumflex in-stent restenosis - She has SL nitro PRN. - No chest pain. No bleeding issues.  - On aspirin, statin, and plavix.  - Unable to do CR with no insurance. - Discussed walking twice a day for 15 minutes at a time, keep HR<130   3. Hypothyroidism  - Followed by Dr Loanne Drilling    4. DMI  - Uncontrolled. Followed by Dr Loanne Drilling.  - No SGLT2i.   She is uninsured and has been referred to [facility].  Follow up in 3 weeks with APP, consider adding Entresto.  [place], FNP 11/03/20

## 2020-11-03 ENCOUNTER — Other Ambulatory Visit: Payer: Self-pay

## 2020-11-03 ENCOUNTER — Ambulatory Visit (HOSPITAL_COMMUNITY)
Admission: RE | Admit: 2020-11-03 | Discharge: 2020-11-03 | Disposition: A | Discharge status: Home / Self Care | Payer: Self-pay | Source: Ambulatory Visit | Attending: Family Medicine | Admitting: Family Medicine

## 2020-11-03 ENCOUNTER — Encounter (HOSPITAL_COMMUNITY): Payer: Self-pay

## 2020-11-03 VITALS — BP 108/76 | HR 73 | Wt 141.2 lb

## 2020-11-03 DIAGNOSIS — Z79899 Other long term (current) drug therapy: Secondary | ICD-10-CM | POA: Insufficient documentation

## 2020-11-03 DIAGNOSIS — Z8249 Family history of ischemic heart disease and other diseases of the circulatory system: Secondary | ICD-10-CM | POA: Insufficient documentation

## 2020-11-03 DIAGNOSIS — I2510 Atherosclerotic heart disease of native coronary artery without angina pectoris: Secondary | ICD-10-CM | POA: Insufficient documentation

## 2020-11-03 DIAGNOSIS — E039 Hypothyroidism, unspecified: Secondary | ICD-10-CM | POA: Insufficient documentation

## 2020-11-03 DIAGNOSIS — Z7951 Long term (current) use of inhaled steroids: Secondary | ICD-10-CM | POA: Insufficient documentation

## 2020-11-03 DIAGNOSIS — D649 Anemia, unspecified: Secondary | ICD-10-CM | POA: Insufficient documentation

## 2020-11-03 DIAGNOSIS — I252 Old myocardial infarction: Secondary | ICD-10-CM | POA: Insufficient documentation

## 2020-11-03 DIAGNOSIS — Z955 Presence of coronary angioplasty implant and graft: Secondary | ICD-10-CM | POA: Insufficient documentation

## 2020-11-03 DIAGNOSIS — F419 Anxiety disorder, unspecified: Secondary | ICD-10-CM | POA: Insufficient documentation

## 2020-11-03 DIAGNOSIS — I255 Ischemic cardiomyopathy: Secondary | ICD-10-CM | POA: Insufficient documentation

## 2020-11-03 DIAGNOSIS — Z951 Presence of aortocoronary bypass graft: Secondary | ICD-10-CM | POA: Insufficient documentation

## 2020-11-03 DIAGNOSIS — E108 Type 1 diabetes mellitus with unspecified complications: Secondary | ICD-10-CM

## 2020-11-03 DIAGNOSIS — Z9641 Presence of insulin pump (external) (internal): Secondary | ICD-10-CM | POA: Insufficient documentation

## 2020-11-03 DIAGNOSIS — E785 Hyperlipidemia, unspecified: Secondary | ICD-10-CM | POA: Insufficient documentation

## 2020-11-03 DIAGNOSIS — I5022 Chronic systolic (congestive) heart failure: Secondary | ICD-10-CM | POA: Insufficient documentation

## 2020-11-03 DIAGNOSIS — Z596 Low income: Secondary | ICD-10-CM | POA: Insufficient documentation

## 2020-11-03 DIAGNOSIS — Z881 Allergy status to other antibiotic agents status: Secondary | ICD-10-CM | POA: Insufficient documentation

## 2020-11-03 DIAGNOSIS — Z5941 Food insecurity: Secondary | ICD-10-CM | POA: Insufficient documentation

## 2020-11-03 DIAGNOSIS — Z7902 Long term (current) use of antithrombotics/antiplatelets: Secondary | ICD-10-CM | POA: Insufficient documentation

## 2020-11-03 DIAGNOSIS — Z79890 Hormone replacement therapy (postmenopausal): Secondary | ICD-10-CM | POA: Insufficient documentation

## 2020-11-03 DIAGNOSIS — E1065 Type 1 diabetes mellitus with hyperglycemia: Secondary | ICD-10-CM | POA: Insufficient documentation

## 2020-11-03 DIAGNOSIS — Z7982 Long term (current) use of aspirin: Secondary | ICD-10-CM | POA: Insufficient documentation

## 2020-11-03 LAB — BASIC METABOLIC PANEL
Anion gap: 8 (ref 5–15)
BUN: 11 mg/dL (ref 6–20)
CO2: 25 mmol/L (ref 22–32)
Calcium: 9 mg/dL (ref 8.9–10.3)
Chloride: 102 mmol/L (ref 98–111)
Creatinine, Ser: 0.82 mg/dL (ref 0.44–1.00)
GFR, Estimated: >60 mL/min (ref >60)
Glucose, Bld: 209 mg/dL — ABNORMAL HIGH (ref 70–99)
Potassium: 4.8 mmol/L (ref 3.5–5.1)
Sodium: 135 mmol/L (ref 135–145)

## 2020-11-03 NOTE — Patient Instructions (Addendum)
Take your medication Metoprolol Succinate 12.5 mg 1 tablet daily.  Patient needs to walk 15-20 minutes per day  Labs today We will only contact you if something comes back abnormal or we need to make some changes. Otherwise no news is good news!  Your physician recommends that you schedule a follow-up appointment in 3 to 4 weeks here in the App clinic.   At the [facility], you and your health needs are our priority. As part of our continuing mission to provide you with exceptional heart care, we have created designated Provider Care Teams. These Care Teams include your primary Cardiologist (physician) and Advanced Practice Providers (APPs- Physician Assistants and Nurse Practitioners) who all work together to provide you with the care you need, when you need it.   You may see any of the following providers on your designated Care Team at your next follow up: Dr Glori Bickers Dr Loralie Champagne Dr Patrice Paradise, NP Lyda Jester, [place] Ginnie Smart Audry Riles, PharmD   Please be sure to bring in all your medications bottles to every appointment.

## 2020-11-25 NOTE — Progress Notes (Signed)
Advanced Heart Failure Clinic Note    PCP: [name], NP PCP-Cardiologist: Fransico Him, MD  HF Cardiologist: Dr. Haroldine Laws  HPI: Jennifer Hogan is a 50 y.o.with a history of recenlty diagnosed HFrEF, CAD, s/p CABG (sequential LIMA to LAD and Ramus intermedius, RIMA to OM of Lcx) in 2020, DM Type I,  anemia, anxiety, hypothyroidism, and hyperlipidemia.    Admitted 10/01/2020 with DKA and NSTEMI.  HS troponin > 24K. Hospital course complicated by acute respiratory failure and pulmonary edema. ECHO with reduced EF down to 20-25%. LHC showed known severe native CAD with total occlusion of the proximal LAD, moderate stenosis of the intermediate branch, and severe ostial circumflex in-stent restenosis, bypass grafts were patent. Normal LVEDP. Diuresed with IV lasix. Discharged on Toprol Xl and spironolactone.   Initially seen in [facility] clinic post discharge. Has recently been followed closely in HF clinic for titration of GDMT.  Here today for HF f/u. Feels okay. Breathing still feels labored with exertion but stable from last visit. Takes care of her 2 horses and [place] land. Walks for about 10 minutes 2-3 X a day otherwise reports she has not been very active. No orthopnea, PND or leg edema. Weight up 3 lb from last visit. Has not needed to take furosemide recently.  Denies dizziness, presyncope or syncope.  Initial systolic blood pressure low, 89/64. On recheck 110/54.  Notes a lot of fatigue and sleeps a lot. She believes she may be dealing with some depression and anxiety especially after admission a few months ago.   Denies any bleeding issues.  Works part time as a Acupuncturist at [employer].   Medicaid application pending.      Cardiac Testing  Echo 09/2020 EF 20-25%  Echo 11/2018 EF 55-60%    Cath 09/2020   Mid LAD lesion is 60% stenosed.   Ost Cx to Prox Cx lesion is 99% stenosed.   Prox Cx to Mid Cx lesion is 70% stenosed.   Ramus-1 lesion is 60%  stenosed.   Ramus-2 lesion is 20% stenosed.   Ramus-3 lesion is 70% stenosed.   Prox LAD to Mid LAD lesion is 100% stenosed.   Dist LAD-1 lesion is 70% stenosed.   Dist LAD-2 lesion is 70% stenosed.   Non-stenotic Mid LAD to Dist LAD lesion was previously treated.   RIMA graft was visualized by angiography and is small.   LIMA and is large.   The graft exhibits no disease.   LV end diastolic pressure is normal.  Severe native vessel CAD with total occlusion of the proximal LAD, moderate stenosis of the intermediate branch, and severe ostial circumflex in-stent restenosis S/P CABG with patency of the both the RIMA-circumflex and LAD sequential to the intermediate   ROS: All systems negative except as listed in HPI, PMH and Problem List   Past Medical History:  Diagnosis Date   Anemia    Anxiety    Coronary artery disease    a. s/p PTCA DES x3 in the LAD (ostial DES placed, mid DES placed, distal DES placed) and PTCA/DES x1 to intermediate branch 02/2017. b. ACS 02/08/18 with LCx stent placement.   Depression    Diabetes mellitus    type 1, on insulin pump   DKA (diabetic ketoacidoses) 12/31/2017   Elevated platelet count    High cholesterol    Hypothyroidism    Ischemic cardiomyopathy    a. EF low-normal with basal inferior akinesis, basal septal hypokinesis by echo  01/2018.   Current Outpatient Medications  Medication Sig Dispense Refill   acetaminophen (TYLENOL) 325 MG tablet Take 650 mg by mouth daily as needed (couch/pain/fever).     albuterol (VENTOLIN HFA) 108 (90 Base) MCG/ACT inhaler INHALE 2 PUFFS INTO THE LUNGS EVERY 6 (SIX) HOURS AS NEEDED FOR WHEEZING OR SHORTNESS OF BREATH. 8.5 g 2   aspirin 81 MG EC tablet Take 1 tablet (81 mg total) by mouth daily. 90 tablet 0   atorvastatin (LIPITOR) 20 MG tablet Take 1 tablet (20 mg total) by mouth daily. 30 tablet 0   b complex vitamins tablet Take 1 tablet by mouth daily.     budesonide (PULMICORT) 0.25 MG/2ML nebulizer solution  Take 2 mLs (0.25 mg total) by nebulization 2 (two) times daily. 60 mL 12   clopidogrel (PLAVIX) 75 MG tablet TAKE 1 TABLET BY MOUTH DAILY WITH BREAKFAST. 90 tablet 3   diclofenac Sodium (VOLTAREN) 1 % GEL Apply 2 g topically 4 (four) times daily. (Patient taking differently: Apply 2 g topically 4 (four) times daily. As needed) 200 g 3   FLUoxetine (PROZAC) 40 MG capsule TAKE 1 CAPSULE BY MOUTH DAILY. 90 capsule 1   fluticasone (FLONASE) 50 MCG/ACT nasal spray Place 1 spray into both nostrils at bedtime as needed for allergies.     furosemide (LASIX) 20 MG tablet Take 1 tablet (20 mg total) by mouth daily as needed (for weight gain). 15 tablet 3   hydrOXYzine (ATARAX/VISTARIL) 25 MG tablet Take 1 tablet (25 mg total) by mouth 3 (three) times daily as needed. 60 tablet 1   Insulin Human (INSULIN PUMP) SOLN Inject into the skin continuous. Humalog     insulin lispro (HUMALOG) 100 UNIT/ML injection Inject 35 units into the skin daily via pump 10 mL 1   ipratropium (ATROVENT) 0.06 % nasal spray Place 2 sprays into both nostrils 2 (two) times daily as needed for rhinitis.     levothyroxine (SYNTHROID) 100 MCG tablet TAKE 1 TABLET (100 MCG TOTAL) BY MOUTH DAILY BEFORE BREAKFAST. 90 tablet 3   metoCLOPramide (REGLAN) 5 MG tablet Take 1 tablet (5 mg total) by mouth every 12 (twelve) hours as needed for nausea or vomiting. 90 tablet 1   metoprolol succinate (TOPROL-XL) 25 MG 24 hr tablet Take 0.5 tablets (12.5 mg total) by mouth daily. 15 tablet 0   montelukast (SINGULAIR) 10 MG tablet TAKE 1 TABLET (10 MG TOTAL) BY MOUTH AT BEDTIME. 30 tablet 0   nitroGLYCERIN (NITROSTAT) 0.4 MG SL tablet Place 1 tablet (0.4 mg total) under the tongue every 5 (five) minutes as needed for chest pain. 25 tablet 6   ondansetron (ZOFRAN-ODT) 4 MG disintegrating tablet TAKE 1 TABLET (4 MG TOTAL) BY MOUTH EVERY 8 (EIGHT) HOURS AS NEEDED FOR NAUSEA OR VOMITING. 20 tablet 1   pantoprazole (PROTONIX) 40 MG tablet Take 1 tablet (40 mg  total) by mouth 2 (two) times daily as needed (acid reflux). 60 tablet 3   No current facility-administered medications for this encounter.   Allergies  Allergen Reactions   Atorvastatin Other (See Comments)    Myalgias on 80mg  dose, tolerates 20mg  ok   Crestor [Rosuvastatin Calcium] Other (See Comments)    Severe myalgias and joint pain. Has tolerated atorvastatin though   Monosodium Glutamate Nausea And Vomiting and Other (See Comments)    migraine   Peanut-Containing Drug Products Other (See Comments)    Vomiting, upset stomach and some wheezing   Zetia [Ezetimibe] Other (See Comments)  myalgias   Ciprofloxin Hcl [Ciprofloxacin] Nausea And Vomiting and Other (See Comments)    Severe migraine   Food Color Red [Red Dye] Diarrhea   Social History   Socioeconomic History   Marital status: Divorced    Spouse name: Not on file   Number of children: Not on file   Years of education: Not on file   Highest education level: Professional school degree (e.g., MD, DDS, DVM, JD)  Occupational History   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"   Occupation: adjunct facilty at [employer]    Comment: Chemistry professor  Tobacco Use   Smoking status: Never   Smokeless tobacco: Never   Tobacco comments:    Use marijuana  Vaping Use   Vaping Use: Never used  Substance and Sexual Activity   Alcohol use: No    Alcohol/week: 0.0 standard drinks   Drug use: Not Currently    Types: Marijuana    Comment: last use last week, occaisonal marijuana use   Sexual activity: Not Currently    Birth control/protection: None  Other Topics Concern   Not on file  Social History Narrative   Not on file   Social Determinants of Health   Financial Resource Strain: High Risk   Difficulty of Paying Living Expenses: Very hard  Food Insecurity: Food Insecurity Present   Worried About Running Out of Food in the Last Year: Sometimes true   Ran Out of Food in the Last Year: Sometimes true  Transportation  Needs: No Transportation Needs   Lack of Transportation (Medical): No   Lack of Transportation (Non-Medical): No  Physical Activity: Not on file  Stress: Not on file  Social Connections: Not on file  Intimate Partner Violence: Not on file   Family History  Problem Relation Age of Onset   Cancer Mother        T cell lymphoma   Coronary artery disease Father    Congestive Heart Failure Father    Asthma Father    BP (!) 89/64   Pulse 71   Wt 65.3 kg (144 lb)   SpO2 99%   BMI 23.96 kg/m   Wt Readings from Last 3 Encounters:  11/29/20 65.3 kg (144 lb)  11/03/20 64 kg (141 lb 3.2 oz)  10/26/20 63.3 kg (139 lb 9.6 oz)   PHYSICAL EXAM: General:  Well appearing. No resp difficulty HEENT: normal Neck: supple. no JVD.  Cor: PMI nondisplaced. Regular rate & rhythm. No rubs, gallops or murmurs. Lungs: clear Abdomen: soft, nontender, nondistended. + insulin pump Extremities: no cyanosis, clubbing, rash, edema Neuro: alert & orientedx3, cranial nerves grossly intact. moves all 4 extremities w/o difficulty. Affect pleasant   ASSESSMENT & PLAN: Chronic HFeEF - Echo in 11/2018 post CABG showed EF 55-60%.  - Most recent echo 09/2020 with EF 20-25% in setting of NSTEMI. Stress CM suspected at that time.  - LHC with 09/2020 with severe native CAD Total occlusion pLAD, moderate stenosis intermediate branch, and severe ostial circumflex in-stent restenosis. Bypass grafts patent. - Not certain etiology for CM is primarily ischemic as EF was preserved post bypass and grafts patent on LHC. ? Stress cardiomyopathy versus myocarditis. Will obtain cMRI to better assess etiology once she has Medicaid. - NYHA II early III. Volume stable. Has lasix to use PRN.  - Continue 12.5 mg Toprol XL daily - Spironolactone previously stopped d/t hyperkalemia and losartan stopped 09/22 d/t hypotension. Last K 4.8.  - Check BMET, BNP today. Depending on K,  may retrial spiro or add low-dose losartan 12.5 mg daily.  BP may limit use of ARNi. - Not a candidate for SGLT2 due to DMI  - Narrow QRS.   2. CAD  - H/O CABG in 2020.  - NSTEMI 09/22. LHC 09/2020 Severe native CAD with total occlusion pLAD, moderate intermediate branch, and severe ostial circumflex in-stent restenosis. Bypasses patent - No chest pain.  - On aspirin, statin, plavix and metoprolol xl.  - Unable to do CR with no insurance.Discussed activity as tolerated.   3. Hypothyroidism  - Followed by Dr Loanne Drilling    4. DMI  - Uncontrolled. Last A1c 8.5. Followed by Dr Loanne Drilling.  - No SGLT2i.  5. Hyperlipidemia - On 20 mg Atorvastatin daily. Unable to tolerate higher doses. - Had been on Praluent but had issues with financial assistance program - Followed by primary cardiologist  6. Fatigue - Anxiety and depression may be playing a role. She plans to follow-up with PCP to discuss this. - CBC today to r/o anemia, on DAPT. No bleeding reported. - Last TSH okay  Medicaid application pending. Referred back to our HF CSW.  Follow up as scheduled 12/27/20 with Dr. Haroldine Laws with same day echo  [facility], Lynder Parents, PA-C 11/29/20

## 2020-11-29 ENCOUNTER — Encounter (HOSPITAL_COMMUNITY): Payer: Self-pay

## 2020-11-29 ENCOUNTER — Other Ambulatory Visit: Payer: Self-pay

## 2020-11-29 ENCOUNTER — Ambulatory Visit (HOSPITAL_COMMUNITY)
Admission: RE | Admit: 2020-11-29 | Discharge: 2020-11-29 | Disposition: A | Discharge status: Home / Self Care | Payer: Self-pay | Source: Ambulatory Visit | Attending: Family Medicine | Admitting: Family Medicine

## 2020-11-29 VITALS — BP 110/54 | HR 71 | Wt 144.0 lb

## 2020-11-29 DIAGNOSIS — Y838 Other surgical procedures as the cause of abnormal reaction of the patient, or of later complication, without mention of misadventure at the time of the procedure: Secondary | ICD-10-CM | POA: Insufficient documentation

## 2020-11-29 DIAGNOSIS — T82855A Stenosis of coronary artery stent, initial encounter: Secondary | ICD-10-CM | POA: Insufficient documentation

## 2020-11-29 DIAGNOSIS — E039 Hypothyroidism, unspecified: Secondary | ICD-10-CM | POA: Insufficient documentation

## 2020-11-29 DIAGNOSIS — Z7982 Long term (current) use of aspirin: Secondary | ICD-10-CM | POA: Insufficient documentation

## 2020-11-29 DIAGNOSIS — Z79899 Other long term (current) drug therapy: Secondary | ICD-10-CM | POA: Insufficient documentation

## 2020-11-29 DIAGNOSIS — E782 Mixed hyperlipidemia: Secondary | ICD-10-CM

## 2020-11-29 DIAGNOSIS — R5383 Other fatigue: Secondary | ICD-10-CM

## 2020-11-29 DIAGNOSIS — Z951 Presence of aortocoronary bypass graft: Secondary | ICD-10-CM | POA: Insufficient documentation

## 2020-11-29 DIAGNOSIS — E109 Type 1 diabetes mellitus without complications: Secondary | ICD-10-CM | POA: Insufficient documentation

## 2020-11-29 DIAGNOSIS — I5022 Chronic systolic (congestive) heart failure: Secondary | ICD-10-CM

## 2020-11-29 DIAGNOSIS — E785 Hyperlipidemia, unspecified: Secondary | ICD-10-CM | POA: Insufficient documentation

## 2020-11-29 DIAGNOSIS — Z7902 Long term (current) use of antithrombotics/antiplatelets: Secondary | ICD-10-CM | POA: Insufficient documentation

## 2020-11-29 DIAGNOSIS — F418 Other specified anxiety disorders: Secondary | ICD-10-CM | POA: Insufficient documentation

## 2020-11-29 DIAGNOSIS — I2510 Atherosclerotic heart disease of native coronary artery without angina pectoris: Secondary | ICD-10-CM

## 2020-11-29 DIAGNOSIS — I252 Old myocardial infarction: Secondary | ICD-10-CM | POA: Insufficient documentation

## 2020-11-29 LAB — BASIC METABOLIC PANEL
Anion gap: 8 (ref 5–15)
BUN: 8 mg/dL (ref 6–20)
CO2: 24 mmol/L (ref 22–32)
Calcium: 9.5 mg/dL (ref 8.9–10.3)
Chloride: 103 mmol/L (ref 98–111)
Creatinine, Ser: 0.93 mg/dL (ref 0.44–1.00)
GFR, Estimated: >60 mL/min (ref >60)
Glucose, Bld: 353 mg/dL — ABNORMAL HIGH (ref 70–99)
Potassium: 5.4 mmol/L — ABNORMAL HIGH (ref 3.5–5.1)
Sodium: 135 mmol/L (ref 135–145)

## 2020-11-29 LAB — CBC
HCT: 39.6 % (ref 36.0–46.0)
Hemoglobin: 13.6 g/dL (ref 12.0–15.0)
MCH: 32.8 pg (ref 26.0–34.0)
MCHC: 34.3 g/dL (ref 30.0–36.0)
MCV: 95.4 fL (ref 80.0–100.0)
Platelets: 414 10*3/uL — ABNORMAL HIGH (ref 150–400)
RBC: 4.15 MIL/uL (ref 3.87–5.11)
RDW: 13.2 % (ref 11.5–15.5)
WBC: 6.6 10*3/uL (ref 4.0–10.5)
nRBC: 0 % (ref 0.0–0.2)

## 2020-11-29 LAB — BRAIN NATRIURETIC PEPTIDE: B Natriuretic Peptide: 75.4 pg/mL (ref 0.0–100.0)

## 2020-11-29 NOTE — Progress Notes (Signed)
Heart and Vascular Care Navigation  11/29/2020  Jennifer Hogan February 29, 1970 [id number]  Reason for Referral: Medicaid/disability follow up and mental health concerns.   Engaged with patient face to face for follow up visit for Heart and Vascular Care Coordination.                                                                                                   Assessment:   CSW completed chart review and saw that pt was working with the [facility] on disability application.  Pt confirms and states she has packet of paperwork at home she needs to be finishing for her application.  Pt reports low motivation to complete anything for herself due to depression and anxiety.  Pt lives alone with her cats and her horses.  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  Pt has done talk therapy in the past but felt like she exhausted usefulness of this and did not feel as if it offered her good solutions to her problems.  Main self identified issue at this time is that her house is extremely unclean- reports she is not a hoarder but just lets everything clutter up because she does not care enough to clean it.   Pt states she doesn't know what she likes or feel like she has accomplished anything to be proud of because she was always focused on her parents- her father is now deceased and her mother reportedly abandoned her.  Is trying to figure out how to live for her now.  As pt self identified main goal at this time is to get house clean and think this will help her mental state CSW discussed ways she can work towards this goal.  Encouraged that she set small goals for herself that she can incorporate in her schedule so that it takes minimal motivation to do.  Identifed that taking her medications is something she is consistent with so encouraged her to throw away a set number of things everytime she takes her medications or set a 5 minute  timer after taking her medications and clean for that period of time and then let her self forget about it the rest of the day if she would like.  Pt very encouraged by this idea and will plan on implementing.                                   HRT/VAS Care Coordination     Patients Home Cardiology Office --  [facility]   Outpatient Care Team Social Worker   Social Worker Name: Tammy Sours- Advanced HF Clinic- [phone]   Living arrangements for the past 2 months Single Family Home   Lives with: Pets; Self   Patient Current Insurance Coverage Self-Pay   Patient Has Concern With Paying Medical Bills Yes   Medical Bill Referrals: pending disability application with [facility]   Does Patient Have Prescription Coverage? No   Patient  Prescription Assistance Programs Heart Failure Fund   Home Assistive Devices/Equipment None   DME Agency NA   [facility] Agency NA       Social History:                                                                             SDOH Screenings   Alcohol Screen: Low Risk    Last Alcohol Screening Score (AUDIT): 0  Depression (PHQ2-9): Not on file  Financial Resource Strain: High Risk   Difficulty of Paying Living Expenses: Very hard  Food Insecurity: Food Insecurity Present   Worried About Charity fundraiser in the Last Year: Sometimes true   Ran Out of Food in the Last Year: Sometimes true  Housing: Low Risk    Last Housing Risk Score: 0  Physical Activity: Not on file  Social Connections: Not on file  Stress: Not on file  Tobacco Use: Low Risk    Smoking Tobacco Use: Never   Smokeless Tobacco Use: Never   Passive Exposure: Not on file  Transportation Needs: No Transportation Needs   Lack of Transportation (Medical): No   Lack of Transportation (Non-Medical): No    SDOH Interventions: Financial Resources:    Fish farm manager for Licensed [employer] Insecurity:   N/a  Housing Insecurity:  N/a  Transportation:   N/a    Other  Care Navigation Interventions:     Inpatient/Outpatient Substance Abuse Counseling/Rehab Options N/a  Provided Pharmacy assistance resources Heart Failure Fund  Patient expressed Mental Health concerns yes  Patient Referred to: Discussed mental health referrals but not interested at this time.  CSW provided counseling and offered to continue contact with patient and discuss coping mechanisms.   Follow-up plan:    Pt will go home and work on disability paperwork for [facility].  Pt will plan to clean for a short period of time or throw away a set number of items after she takes her medication in the morning.  CSW will check in with pt regarding both goals on Wednesday to see how she did and what further steps we can take.  Jorge Ny, LCSW Clinical Social Worker Advanced Heart Failure Clinic Desk#: [phone] Cell#: [phone]

## 2020-11-29 NOTE — Patient Instructions (Signed)
Labs were done today, if any labs are abnormal the clinic will call you  Your physician recommends that you schedule a follow-up appointment in: please keep scheduled appointment in December   At the [facility], you and your health needs are our priority. As part of our continuing mission to provide you with exceptional heart care, we have created designated Provider Care Teams. These Care Teams include your primary Cardiologist (physician) and Advanced Practice Providers (APPs- Physician Assistants and Nurse Practitioners) who all work together to provide you with the care you need, when you need it.   You may see any of the following providers on your designated Care Team at your next follow up: Dr Glori Bickers Dr Haynes Kerns, NP Lyda Jester, [place] [facility] [place], [place] Audry Riles, PharmD   Please be sure to bring in all your medications bottles to every appointment.   If you have any questions or concerns before your next appointment please send [name] a message through [place] or call our office at [phone].    TO LEAVE A MESSAGE FOR THE NURSE SELECT OPTION 2, PLEASE LEAVE A MESSAGE INCLUDING: YOUR NAME DATE OF BIRTH CALL BACK NUMBER REASON FOR CALL**this is important as we prioritize the call backs  YOU WILL RECEIVE A CALL BACK THE SAME DAY AS LONG AS YOU CALL BEFORE 4:00 PM

## 2020-11-29 NOTE — Addendum Note (Signed)
Encounter addended by: Jorge Ny, LCSW on: 11/29/2020 1:08 PM  Actions taken: Flowsheet accepted, Clinical Note Signed

## 2020-12-01 ENCOUNTER — Other Ambulatory Visit: Payer: Self-pay | Admitting: Nurse Practitioner

## 2020-12-01 ENCOUNTER — Other Ambulatory Visit: Payer: Self-pay

## 2020-12-01 ENCOUNTER — Telehealth (HOSPITAL_COMMUNITY): Payer: Self-pay | Admitting: Licensed Clinical Social Worker

## 2020-12-01 ENCOUNTER — Other Ambulatory Visit: Payer: Self-pay | Admitting: Endocrinology

## 2020-12-01 ENCOUNTER — Other Ambulatory Visit: Payer: Self-pay | Admitting: Family Medicine

## 2020-12-01 DIAGNOSIS — J9801 Acute bronchospasm: Secondary | ICD-10-CM

## 2020-12-01 DIAGNOSIS — E039 Hypothyroidism, unspecified: Secondary | ICD-10-CM

## 2020-12-01 DIAGNOSIS — E119 Type 2 diabetes mellitus without complications: Secondary | ICD-10-CM

## 2020-12-01 MED ORDER — MONTELUKAST SODIUM 10 MG PO TABS
ORAL_TABLET | Freq: Every day | ORAL | 0 refills | Status: DC
  Filled 2020-12-01: qty 30, 30d supply, fill #0

## 2020-12-01 MED ORDER — ALBUTEROL SULFATE HFA 108 (90 BASE) MCG/ACT IN AERS
2.0000 | INHALATION_SPRAY | Freq: Four times a day (QID) | RESPIRATORY_TRACT | 2 refills | Status: DC | PRN
  Filled 2020-12-01 – 2021-02-14 (×2): qty 8.5, 25d supply, fill #0
  Filled 2021-03-30 – 2021-04-02 (×2): qty 8.5, 25d supply, fill #1

## 2020-12-01 MED ORDER — LEVOTHYROXINE SODIUM 100 MCG PO TABS
ORAL_TABLET | ORAL | 3 refills | Status: DC
  Filled 2020-12-01: qty 30, 30d supply, fill #0
  Filled 2021-01-13 – 2021-01-14 (×2): qty 30, 30d supply, fill #1
  Filled 2021-01-14 – 2021-02-14 (×2): qty 30, 30d supply, fill #0
  Filled 2021-03-23 (×4): qty 30, 30d supply, fill #1
  Filled 2021-03-31 – 2021-04-28 (×2): qty 30, 30d supply, fill #2
  Filled 2021-06-03: qty 30, 30d supply, fill #3
  Filled 2021-06-29: qty 30, 30d supply, fill #4
  Filled 2021-08-10: qty 30, 30d supply, fill #5
  Filled 2021-09-06: qty 30, 30d supply, fill #6
  Filled 2021-10-10: qty 30, 30d supply, fill #7
  Filled 2021-11-08: qty 30, 30d supply, fill #8

## 2020-12-01 MED ORDER — INSULIN LISPRO 100 UNIT/ML IJ SOLN
INTRAMUSCULAR | 1 refills | Status: DC
  Filled 2020-12-01: qty 10, 28d supply, fill #0
  Filled 2021-01-13: qty 10, 28d supply, fill #1
  Filled 2021-01-14: qty 10, 28d supply, fill #0

## 2020-12-01 NOTE — Telephone Encounter (Signed)
CSW called pt to check in on how she did completing her goal of cleaning up a few things after taking her medications in the morning.  Also wanted to see if she found her disability paperwork sent to her by the [employer] [facility]- CSW had heard back from [employer] and should just be multiple forms to sign giving consent for [employer] [facility] to assist.  Unable to reach- left message requesting return call  Jorge Ny, [place] [facility] Desk#: [phone] Cell#: [phone]

## 2020-12-01 NOTE — Telephone Encounter (Signed)
Requested Prescriptions  Pending Prescriptions Disp Refills  . montelukast (SINGULAIR) 10 MG tablet 30 tablet 0    Sig: TAKE 1 TABLET (10 MG TOTAL) BY MOUTH AT BEDTIME.     Pulmonology:  Leukotriene Inhibitors Passed - 12/01/2020  8:28 AM      Passed - Valid encounter within last 12 months    Recent Outpatient Visits          1 month ago Flu-like symptoms   [place], Vernia Buff, NP   4 months ago Chronic fatigue   [place], Vernia Buff, NP   1 year ago Cough in adult patient   [place], Vernia Buff, NP   1 year ago Anxiety and depression   [place], Vernia Buff, NP   1 year ago Gastroparesis   [place], [place]      Future Appointments            In 3 months [place], Eber Hong, MD [place], [facility]

## 2020-12-01 NOTE — Telephone Encounter (Signed)
Requested Prescriptions  Pending Prescriptions Disp Refills  . albuterol (VENTOLIN HFA) 108 (90 Base) MCG/ACT inhaler 8.5 g 2    Sig: INHALE 2 PUFFS INTO THE LUNGS EVERY 6 (SIX) HOURS AS NEEDED FOR WHEEZING OR SHORTNESS OF BREATH.     Pulmonology:  Beta Agonists Failed - 12/01/2020  8:28 AM      Failed - One inhaler should last at least one month. If the patient is requesting refills earlier, contact the patient to check for uncontrolled symptoms.      Passed - Valid encounter within last 12 months    Recent Outpatient Visits          1 month ago Flu-like symptoms   [place], Vernia Buff, NP   4 months ago Chronic fatigue   [place], Vernia Buff, NP   1 year ago Cough in adult patient   [place], Vernia Buff, NP   1 year ago Anxiety and depression   [place], Vernia Buff, NP   1 year ago Gastroparesis   [place], [place]      Future Appointments            In 3 months Turner, Eber Hong, MD [place], [facility]

## 2020-12-02 ENCOUNTER — Telehealth (HOSPITAL_COMMUNITY): Payer: Self-pay | Admitting: Licensed Clinical Social Worker

## 2020-12-02 NOTE — Telephone Encounter (Signed)
CSW called pt to check in- unable to reach- went straight to VM- sent email to attempt contact.  Jorge Ny, LCSW Clinical Social Worker Advanced Heart Failure Clinic Desk#: [phone] Cell#: [phone]

## 2020-12-03 ENCOUNTER — Encounter (HOSPITAL_COMMUNITY): Payer: Self-pay

## 2020-12-03 ENCOUNTER — Other Ambulatory Visit: Payer: Self-pay

## 2020-12-08 ENCOUNTER — Encounter (HOSPITAL_COMMUNITY): Payer: Self-pay

## 2020-12-08 ENCOUNTER — Telehealth (HOSPITAL_COMMUNITY): Payer: Self-pay | Admitting: Licensed Clinical Social Worker

## 2020-12-08 NOTE — Telephone Encounter (Signed)
CSW has been unable to reach by phone so sent message through [place] requesting she reach out to check in.  Will continue to follow and assist as needed  Jorge Ny, [place] [facility] Desk#: [phone] Cell#: [phone]

## 2020-12-12 ENCOUNTER — Encounter: Payer: Self-pay | Admitting: Endocrinology

## 2020-12-13 ENCOUNTER — Telehealth (INDEPENDENT_AMBULATORY_CARE_PROVIDER_SITE_OTHER): Payer: Self-pay | Admitting: Endocrinology

## 2020-12-13 ENCOUNTER — Other Ambulatory Visit: Payer: Self-pay

## 2020-12-13 ENCOUNTER — Encounter: Payer: Self-pay | Admitting: Endocrinology

## 2020-12-13 VITALS — BP 140/74 | Ht 65.0 in | Wt 148.0 lb

## 2020-12-13 DIAGNOSIS — E1059 Type 1 diabetes mellitus with other circulatory complications: Secondary | ICD-10-CM

## 2020-12-13 NOTE — Patient Instructions (Addendum)
Please continue these pump settings: basal rate of 0.85 units/hr, 6 AM-10 AM, 0.975 units/hr, 10 AM-10 PM, 0.75 units/hr 10PM-12MN, and 0.625 overnight.  bolus of 1 unit/16 grams carbohydrate.  correction bolus (which some people call "sensitivity," or "insulin sensitivity ratio," or just "isr") of 1 unit for each 100 by which your glucose exceeds 100.  I am asking Jennifer Hogan to help you with your continuous glucose monitor.   Please come back for a follow-up appointment in 2 months.

## 2020-12-13 NOTE — Progress Notes (Signed)
Subjective:    [name], female    DOB: 1971/01/17, 50 y.o.   MRN: [id number]  HPI telehealth visit today via video visit.  Alternatives to telehealth are presented to this patient, and the patient agrees to the telehealth visit.  Pt is advised of the cost of the visit, and agrees to this, also.   Patient is at home, and I am at the office.   Persons attending the telehealth visit: the patient and I pt returns for f/u of diabetes mellitus:  DM type: 1  Dx'ed: 0814 Complications: CAD.   Therapy: insulin since dx.  DKA: twice (2020 and 2021) Severe hypoglycemia: never.  Pancreatitis: never Other: she has been on pump (medtronic 670) rx since 2014; she takes FL continuous glucose monitor, rather then medtronic, due to cost.   SDOH:  She did not see GI, due to no ins at the time; she has medicaid; she teaches part time.   Interval history: She now takes these pump settings: basal rate of 0.85 units/hr, 6 AM-10 AM, 0.975 units/hr, 10 AM-10 PM, 0.75 units/hr 10PM-12MN, and 0.625 overnight.  bolus of 1 unit/16 grams carbohydrate.  correction bolus (which some people call "sensitivity," or "insulin sensitivity ratio," or just "isr") of 1 unit for each 100 by which your glucose exceeds 100.   Pt says she has been unable to upload data to carelink x 3 mos.  2 days ago, she had glucose of 40, after accidentally giving herself too much bolus insulin.   She has mild hypoglycemia approx once per week.   Past Medical History:  Diagnosis Date   Anemia    Anxiety    Coronary artery disease    a. s/p PTCA DES x3 in the LAD (ostial DES placed, mid DES placed, distal DES placed) and PTCA/DES x1 to intermediate branch 02/2017. b. ACS 02/08/18 with LCx stent placement.   Depression    Diabetes mellitus    type 1, on insulin pump   DKA (diabetic ketoacidoses) 12/31/2017   Elevated platelet count    High cholesterol    Hypothyroidism    Ischemic cardiomyopathy    a. EF  low-normal with basal inferior akinesis, basal septal hypokinesis by echo 01/2018.    Past Surgical History:  Procedure Laterality Date   APPENDECTOMY     CORONARY ARTERY BYPASS GRAFT N/A 11/04/2018   Procedure: CORONARY ARTERY BYPASS GRAFTING (CABG), ON PUMP, TIMES THREE, USING LEFT AND RIGHT INTERNAL MAMMARY ARTERIES;  Surgeon: Wonda Olds, MD;  Location: [facility];  Service: Open Heart Surgery;  Laterality: N/A;   CORONARY STENT INTERVENTION N/A 03/20/2017   Procedure: DES x 3 LAD, DES OM; Surgeon: Burnell Blanks, MD;  Location: [facility] [facility];  Service: Cardiovascular;  Laterality: N/A;   CORONARY/GRAFT ACUTE MI REVASCULARIZATION N/A 02/08/2018   Procedure: Coronary/Graft Acute MI Revascularization;  Surgeon: Belva Crome, MD;  Location: [facility];  Service: Cardiovascular;  Laterality: N/A;   LEFT HEART CATH AND CORONARY ANGIOGRAPHY N/A 03/20/2017   Procedure: LEFT HEART CATH AND CORONARY ANGIOGRAPHY;  Surgeon: Burnell Blanks, MD;  Location: [facility];  Service: Cardiovascular;  Laterality: N/A;   LEFT HEART CATH AND CORONARY ANGIOGRAPHY N/A 02/08/2018   Procedure: LEFT HEART CATH AND CORONARY ANGIOGRAPHY;  Surgeon: Belva Crome, MD;  Location: [place] [facility];  Service: Cardiovascular;  Laterality: N/A;   LEFT HEART CATH AND CORONARY ANGIOGRAPHY N/A 10/29/2018   Procedure: LEFT HEART CATH  AND CORONARY ANGIOGRAPHY;  Surgeon: Burnell Blanks, MD;  Location: [facility];  Service: Cardiovascular;  Laterality: N/A;   LEFT HEART CATH AND CORS/GRAFTS ANGIOGRAPHY N/A 10/04/2020   Procedure: LEFT HEART CATH AND CORS/GRAFTS ANGIOGRAPHY;  Surgeon: Sherren Mocha, MD;  Location: [facility];  Service: Cardiovascular;  Laterality: N/A;   TEE WITHOUT CARDIOVERSION N/A 11/04/2018   Procedure: TRANSESOPHAGEAL ECHOCARDIOGRAM (TEE);  Surgeon: Wonda Olds, MD;  Location: [facility];  Service: Open Heart Surgery;  Laterality: N/A;    Social  History   Socioeconomic History   Marital status: Divorced    Spouse name: Not on file   Number of children: Not on file   Years of education: Not on file   Highest education level: Professional school degree (e.g., MD, DDS, DVM, [place])  Occupational History   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"   Occupation: adjunct facilty at [employer]    Comment: Chemistry professor  Tobacco Use   Smoking status: Never   Smokeless tobacco: Never   Tobacco comments:    Use marijuana  Vaping Use   Vaping Use: Never used  Substance and Sexual Activity   Alcohol use: No    Alcohol/week: 0.0 standard drinks   Drug use: Not Currently    Types: Marijuana    Comment: last use last week, occaisonal marijuana use   Sexual activity: Not Currently    Birth control/protection: None  Other Topics Concern   Not on file  Social History Narrative   Not on file   Social Determinants of Health   Financial Resource Strain: High Risk   Difficulty of Paying Living Expenses: Very hard  Food Insecurity: Food Insecurity Present   Worried About Running Out of Food in the Last Year: Sometimes true   Ran Out of Food in the Last Year: Sometimes true  Transportation Needs: No Transportation Needs   Lack of Transportation (Medical): No   Lack of Transportation (Non-Medical): No  Physical Activity: Not on file  Stress: Not on file  Social Connections: Not on file  Intimate Partner Violence: Not on file    Current Outpatient Medications on File Prior to Visit  Medication Sig Dispense Refill   acetaminophen (TYLENOL) 325 MG tablet Take 650 mg by mouth daily as needed (couch/pain/fever).     albuterol (VENTOLIN HFA) 108 (90 Base) MCG/ACT inhaler INHALE 2 PUFFS INTO THE LUNGS EVERY 6 (SIX) HOURS AS NEEDED FOR WHEEZING OR SHORTNESS OF BREATH. 8.5 g 2   aspirin 81 MG EC tablet Take 1 tablet (81 mg total) by mouth daily. 90 tablet 0   atorvastatin (LIPITOR) 20 MG tablet Take 1 tablet (20 mg total) by mouth daily. 30  tablet 0   b complex vitamins tablet Take 1 tablet by mouth daily.     budesonide (PULMICORT) 0.25 MG/2ML nebulizer solution Take 2 mLs (0.25 mg total) by nebulization 2 (two) times daily. 60 mL 12   clopidogrel (PLAVIX) 75 MG tablet TAKE 1 TABLET BY MOUTH DAILY WITH BREAKFAST. 90 tablet 3   diclofenac Sodium (VOLTAREN) 1 % GEL Apply 2 g topically 4 (four) times daily. (Patient taking differently: Apply 2 g topically 4 (four) times daily. As needed) 200 g 3   FLUoxetine (PROZAC) 40 MG capsule TAKE 1 CAPSULE BY MOUTH DAILY. 90 capsule 1   fluticasone (FLONASE) 50 MCG/ACT nasal spray Place 1 spray into both nostrils at bedtime as needed for allergies.     furosemide (LASIX) 20 MG tablet  Take 1 tablet (20 mg total) by mouth daily as needed (for weight gain). 15 tablet 3   hydrOXYzine (ATARAX/VISTARIL) 25 MG tablet Take 1 tablet (25 mg total) by mouth 3 (three) times daily as needed. 60 tablet 1   Insulin Human (INSULIN PUMP) SOLN Inject into the skin continuous. Humalog     insulin lispro (HUMALOG) 100 UNIT/ML injection Inject 35 units into the skin daily via pump 10 mL 1   ipratropium (ATROVENT) 0.06 % nasal spray Place 2 sprays into both nostrils 2 (two) times daily as needed for rhinitis.     levothyroxine (SYNTHROID) 100 MCG tablet TAKE 1 TABLET (100 MCG TOTAL) BY MOUTH DAILY BEFORE BREAKFAST. 90 tablet 3   metoCLOPramide (REGLAN) 5 MG tablet Take 1 tablet (5 mg total) by mouth every 12 (twelve) hours as needed for nausea or vomiting. 90 tablet 1   metoprolol succinate (TOPROL-XL) 25 MG 24 hr tablet Take 0.5 tablets (12.5 mg total) by mouth daily. 15 tablet 0   montelukast (SINGULAIR) 10 MG tablet TAKE 1 TABLET (10 MG TOTAL) BY MOUTH AT BEDTIME. 30 tablet 0   nitroGLYCERIN (NITROSTAT) 0.4 MG SL tablet Place 1 tablet (0.4 mg total) under the tongue every 5 (five) minutes as needed for chest pain. 25 tablet 6   ondansetron (ZOFRAN-ODT) 4 MG disintegrating tablet TAKE 1 TABLET (4 MG TOTAL) BY MOUTH  EVERY 8 (EIGHT) HOURS AS NEEDED FOR NAUSEA OR VOMITING. 20 tablet 1   pantoprazole (PROTONIX) 40 MG tablet Take 1 tablet (40 mg total) by mouth 2 (two) times daily as needed (acid reflux). 60 tablet 3   No current facility-administered medications on file prior to visit.    Allergies  Allergen Reactions   Atorvastatin Other (See Comments)    Myalgias on 80mg  dose, tolerates 20mg  ok   Crestor [Rosuvastatin Calcium] Other (See Comments)    Severe myalgias and joint pain. Has tolerated atorvastatin though   Monosodium Glutamate Nausea And Vomiting and Other (See Comments)    migraine   Peanut-Containing Drug Products Other (See Comments)    Vomiting, upset stomach and some wheezing   Zetia [Ezetimibe] Other (See Comments)    myalgias   Ciprofloxin Hcl [Ciprofloxacin] Nausea And Vomiting and Other (See Comments)    Severe migraine   Food Color Red [Red Dye] Diarrhea    Family History  Problem Relation Age of Onset   Cancer Mother        T cell lymphoma   Coronary artery disease Father    Congestive Heart Failure Father    Asthma Father     BP 140/74 (BP Location: Left Arm, Patient Position: Sitting, Cuff Size: Normal)   Ht 5\' 5"  (1.651 m)   Wt 148 lb (67.1 kg)   BMI 24.63 kg/m   Review of Systems     Objective:   Physical Exam       Assessment & Plan:  Type 1 DM Hypoglycemia, due to insulin: this limits aggressiveness of glycemic control  Patient Instructions  Please continue these pump settings: basal rate of 0.85 units/hr, 6 AM-10 AM, 0.975 units/hr, 10 AM-10 PM, 0.75 units/hr 10PM-12MN, and 0.625 overnight.  bolus of 1 unit/16 grams carbohydrate.  correction bolus (which some people call "sensitivity," or "insulin sensitivity ratio," or just "isr") of 1 unit for each 100 by which your glucose exceeds 100.  I am asking Vaughan Basta to help you with your continuous glucose monitor.   Please come back for a follow-up appointment in 2 months.

## 2020-12-14 ENCOUNTER — Telehealth: Payer: Self-pay | Admitting: Nutrition

## 2020-12-14 NOTE — Telephone Encounter (Signed)
LVM with phone number to call me to schedule time for CGM training

## 2020-12-22 ENCOUNTER — Encounter (HOSPITAL_COMMUNITY): Payer: Medicaid Other | Admitting: Internal Medicine

## 2020-12-26 NOTE — Addendum Note (Signed)
Encounter addended by: Rafael Bihari, FNP on: 12/26/2020 8:18 PM  Actions taken: Clinical Note Signed

## 2020-12-27 ENCOUNTER — Other Ambulatory Visit: Payer: Medicaid Other

## 2020-12-27 ENCOUNTER — Other Ambulatory Visit: Payer: Self-pay

## 2020-12-27 ENCOUNTER — Encounter (HOSPITAL_COMMUNITY): Payer: Self-pay | Admitting: Internal Medicine

## 2020-12-27 ENCOUNTER — Ambulatory Visit (HOSPITAL_BASED_OUTPATIENT_CLINIC_OR_DEPARTMENT_OTHER)
Admission: RE | Admit: 2020-12-27 | Discharge: 2020-12-27 | Disposition: A | Discharge status: Home / Self Care | Payer: Self-pay | Source: Ambulatory Visit

## 2020-12-27 ENCOUNTER — Ambulatory Visit (HOSPITAL_COMMUNITY)
Admission: RE | Admit: 2020-12-27 | Discharge: 2020-12-27 | Disposition: A | Discharge status: Home / Self Care | Payer: Self-pay | Source: Ambulatory Visit | Attending: Internal Medicine | Admitting: Internal Medicine

## 2020-12-27 VITALS — BP 120/84 | HR 69 | Wt 148.6 lb

## 2020-12-27 DIAGNOSIS — D649 Anemia, unspecified: Secondary | ICD-10-CM | POA: Insufficient documentation

## 2020-12-27 DIAGNOSIS — Z7982 Long term (current) use of aspirin: Secondary | ICD-10-CM | POA: Insufficient documentation

## 2020-12-27 DIAGNOSIS — F32A Depression, unspecified: Secondary | ICD-10-CM | POA: Insufficient documentation

## 2020-12-27 DIAGNOSIS — Z955 Presence of coronary angioplasty implant and graft: Secondary | ICD-10-CM | POA: Insufficient documentation

## 2020-12-27 DIAGNOSIS — E039 Hypothyroidism, unspecified: Secondary | ICD-10-CM | POA: Insufficient documentation

## 2020-12-27 DIAGNOSIS — I5022 Chronic systolic (congestive) heart failure: Secondary | ICD-10-CM

## 2020-12-27 DIAGNOSIS — Z7902 Long term (current) use of antithrombotics/antiplatelets: Secondary | ICD-10-CM | POA: Insufficient documentation

## 2020-12-27 DIAGNOSIS — I2510 Atherosclerotic heart disease of native coronary artery without angina pectoris: Secondary | ICD-10-CM | POA: Insufficient documentation

## 2020-12-27 DIAGNOSIS — Z9114 Patient's other noncompliance with medication regimen: Secondary | ICD-10-CM | POA: Insufficient documentation

## 2020-12-27 DIAGNOSIS — E875 Hyperkalemia: Secondary | ICD-10-CM | POA: Insufficient documentation

## 2020-12-27 DIAGNOSIS — I252 Old myocardial infarction: Secondary | ICD-10-CM | POA: Insufficient documentation

## 2020-12-27 DIAGNOSIS — E108 Type 1 diabetes mellitus with unspecified complications: Secondary | ICD-10-CM

## 2020-12-27 DIAGNOSIS — Z9641 Presence of insulin pump (external) (internal): Secondary | ICD-10-CM | POA: Insufficient documentation

## 2020-12-27 DIAGNOSIS — Z79899 Other long term (current) drug therapy: Secondary | ICD-10-CM | POA: Insufficient documentation

## 2020-12-27 DIAGNOSIS — E109 Type 1 diabetes mellitus without complications: Secondary | ICD-10-CM | POA: Insufficient documentation

## 2020-12-27 DIAGNOSIS — Z951 Presence of aortocoronary bypass graft: Secondary | ICD-10-CM | POA: Insufficient documentation

## 2020-12-27 DIAGNOSIS — E785 Hyperlipidemia, unspecified: Secondary | ICD-10-CM | POA: Insufficient documentation

## 2020-12-27 DIAGNOSIS — F419 Anxiety disorder, unspecified: Secondary | ICD-10-CM | POA: Insufficient documentation

## 2020-12-27 LAB — COMPREHENSIVE METABOLIC PANEL
ALT: 16 U/L (ref 0–44)
AST: 25 U/L (ref 15–41)
Albumin: 3.8 g/dL (ref 3.5–5.0)
Alkaline Phosphatase: 79 U/L (ref 38–126)
Anion gap: 5 (ref 5–15)
BUN: 11 mg/dL (ref 6–20)
CO2: 27 mmol/L (ref 22–32)
Calcium: 9 mg/dL (ref 8.9–10.3)
Chloride: 105 mmol/L (ref 98–111)
Creatinine, Ser: 0.8 mg/dL (ref 0.44–1.00)
GFR, Estimated: >60 mL/min (ref >60)
Glucose, Bld: 95 mg/dL (ref 70–99)
Potassium: 4.4 mmol/L (ref 3.5–5.1)
Sodium: 137 mmol/L (ref 135–145)
Total Bilirubin: 0.3 mg/dL (ref 0.3–1.2)
Total Protein: 7.2 g/dL (ref 6.5–8.1)

## 2020-12-27 LAB — ECHOCARDIOGRAM COMPLETE
Area-P 1/2: 4.06 cm2
Calc EF: 45 %
S' Lateral: 3.5 cm
Single Plane A2C EF: 48 %
Single Plane A4C EF: 43.1 %

## 2020-12-27 NOTE — Progress Notes (Signed)
Advanced Heart Failure Clinic Note    PCP: [name], NP PCP-Cardiologist: Fransico Him, MD  HF Cardiologist: Dr. Haroldine Laws  HPI: Ms Jennifer Hogan is a 50 y.o.with a history of recenlty diagnosed HFrEF, CAD, status post coronary artery bypass and graft (LIMA to the LAD/ramus intermedius and RIMA to the obtuse marginal), DM Type I,  anemia, anxiety, hypothyroidism, and hyperlipidemia.    Admitted 10/01/2020 with DKA and NSTEMI.  Hospital course complicated by acute respiratory failure and pulmonary edema. Had ECHO with reduced EF down to 20-25%. LHC showed severe CAD with total occlusion of the proximal LAD, moderate stenosis of the intermediate branch, and severe ostial circumflex in-stent restenosis. Diuresed with IV lasix. Discharged on Toprol Xl and spironolactone. Discharged to home on 10/06/2020.   Seen in [facility] clinic post discharge, some SOB and fatigue and light-headed. GDMT titrated.  Today she returns for HF follow up. Says he worst problem right now is depression. Says she has not been trying to be active. Can do limited activity (go to mailbox, feed horses) without too much problem but if has to more gets SOB. No edema, orthopnea or PND. Compliant with meds.     Echo today (12/27/20)  EF 40-45% RV mildly HK Personally reviewed    Cardiac Testing  Echo 09/2020 EF 20-25%  Echo 11/2018 EF 55-60%    Cath 09/2020   Mid LAD lesion is 60% stenosed.   Ost Cx to Prox Cx lesion is 99% stenosed.   Prox Cx to Mid Cx lesion is 70% stenosed.   Ramus-1 lesion is 60% stenosed.   Ramus-2 lesion is 20% stenosed.   Ramus-3 lesion is 70% stenosed.   Prox LAD to Mid LAD lesion is 100% stenosed.   Dist LAD-1 lesion is 70% stenosed.   Dist LAD-2 lesion is 70% stenosed.   Non-stenotic Mid LAD to Dist LAD lesion was previously treated.   RIMA graft was visualized by angiography and is small.   LIMA and is large.   The graft exhibits no disease.   LV end diastolic pressure is normal.  Severe  native vessel CAD with total occlusion of the proximal LAD, moderate stenosis of the intermediate branch, and severe ostial circumflex in-stent restenosis S/P CABG with patency of the both the RIMA-circumflex and LAD sequential to the intermediate   Past Medical History:  Diagnosis Date   Anemia    Anxiety    Coronary artery disease    a. s/p PTCA DES x3 in the LAD (ostial DES placed, mid DES placed, distal DES placed) and PTCA/DES x1 to intermediate branch 02/2017. b. ACS 02/08/18 with LCx stent placement.   Depression    Diabetes mellitus    type 1, on insulin pump   DKA (diabetic ketoacidoses) 12/31/2017   Elevated platelet count    High cholesterol    Hypothyroidism    Ischemic cardiomyopathy    a. EF low-normal with basal inferior akinesis, basal septal hypokinesis by echo 01/2018.   Current Outpatient Medications  Medication Sig Dispense Refill   acetaminophen (TYLENOL) 325 MG tablet Take 650 mg by mouth daily as needed (couch/pain/fever).     albuterol (VENTOLIN HFA) 108 (90 Base) MCG/ACT inhaler INHALE 2 PUFFS INTO THE LUNGS EVERY 6 (SIX) HOURS AS NEEDED FOR WHEEZING OR SHORTNESS OF BREATH. 8.5 g 2   aspirin 81 MG EC tablet Take 1 tablet (81 mg total) by mouth daily. 90 tablet 0   atorvastatin (LIPITOR) 20 MG tablet Take 1 tablet (20 mg total) by  mouth daily. 30 tablet 0   b complex vitamins tablet Take 1 tablet by mouth daily.     budesonide (PULMICORT) 0.25 MG/2ML nebulizer solution Take 0.25 mg by nebulization as needed.     clopidogrel (PLAVIX) 75 MG tablet TAKE 1 TABLET BY MOUTH DAILY WITH BREAKFAST. 90 tablet 3   diclofenac Sodium (VOLTAREN) 1 % GEL Apply topically as needed.     FLUoxetine (PROZAC) 40 MG capsule TAKE 1 CAPSULE BY MOUTH DAILY. 90 capsule 1   fluticasone (FLONASE) 50 MCG/ACT nasal spray Place 1 spray into both nostrils at bedtime as needed for allergies.     furosemide (LASIX) 20 MG tablet Take 1 tablet (20 mg total) by mouth daily as needed (for weight  gain). 15 tablet 3   hydrOXYzine (ATARAX/VISTARIL) 25 MG tablet Take 1 tablet (25 mg total) by mouth 3 (three) times daily as needed. 60 tablet 1   Insulin Human (INSULIN PUMP) SOLN Inject into the skin continuous. Humalog     insulin lispro (HUMALOG) 100 UNIT/ML injection Inject 35 units into the skin daily via pump 10 mL 1   ipratropium (ATROVENT) 0.06 % nasal spray Place 2 sprays into both nostrils 2 (two) times daily as needed for rhinitis.     levothyroxine (SYNTHROID) 100 MCG tablet TAKE 1 TABLET (100 MCG TOTAL) BY MOUTH DAILY BEFORE BREAKFAST. 90 tablet 3   metoCLOPramide (REGLAN) 5 MG tablet Take 1 tablet (5 mg total) by mouth every 12 (twelve) hours as needed for nausea or vomiting. 90 tablet 1   metoprolol succinate (TOPROL-XL) 25 MG 24 hr tablet Take 0.5 tablets (12.5 mg total) by mouth daily. 15 tablet 0   montelukast (SINGULAIR) 10 MG tablet TAKE 1 TABLET (10 MG TOTAL) BY MOUTH AT BEDTIME. 30 tablet 0   nitroGLYCERIN (NITROSTAT) 0.4 MG SL tablet Place 1 tablet (0.4 mg total) under the tongue every 5 (five) minutes as needed for chest pain. 25 tablet 6   ondansetron (ZOFRAN-ODT) 4 MG disintegrating tablet TAKE 1 TABLET (4 MG TOTAL) BY MOUTH EVERY 8 (EIGHT) HOURS AS NEEDED FOR NAUSEA OR VOMITING. 20 tablet 1   pantoprazole (PROTONIX) 40 MG tablet Take 1 tablet (40 mg total) by mouth 2 (two) times daily as needed (acid reflux). 60 tablet 3   No current facility-administered medications for this encounter.   Allergies  Allergen Reactions   Atorvastatin Other (See Comments)    Myalgias on 80mg  dose, tolerates 20mg  ok   Crestor [Rosuvastatin Calcium] Other (See Comments)    Severe myalgias and joint pain. Has tolerated atorvastatin though   Monosodium Glutamate Nausea And Vomiting and Other (See Comments)    migraine   Peanut-Containing Drug Products Other (See Comments)    Vomiting, upset stomach and some wheezing   Zetia [Ezetimibe] Other (See Comments)    myalgias   Ciprofloxin  Hcl [Ciprofloxacin] Nausea And Vomiting and Other (See Comments)    Severe migraine   Food Color Red [Red Dye] Diarrhea   Social History   Socioeconomic History   Marital status: Divorced    Spouse name: Not on file   Number of children: Not on file   Years of education: Not on file   Highest education level: Professional school degree (e.g., MD, DDS, DVM, JD)  Occupational History   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"   Occupation: adjunct facilty at [employer]    Comment: Chemistry professor  Tobacco Use   Smoking status: Never   Smokeless tobacco: Never  Tobacco comments:    Use marijuana  Vaping Use   Vaping Use: Never used  Substance and Sexual Activity   Alcohol use: No    Alcohol/week: 0.0 standard drinks   Drug use: Not Currently    Types: Marijuana    Comment: last use last week, occaisonal marijuana use   Sexual activity: Not Currently    Birth control/protection: None  Other Topics Concern   Not on file  Social History Narrative   Not on file   Social Determinants of Health   Financial Resource Strain: High Risk   Difficulty of Paying Living Expenses: Very hard  Food Insecurity: Food Insecurity Present   Worried About Running Out of Food in the Last Year: Sometimes true   Ran Out of Food in the Last Year: Sometimes true  Transportation Needs: No Transportation Needs   Lack of Transportation (Medical): No   Lack of Transportation (Non-Medical): No  Physical Activity: Not on file  Stress: Not on file  Social Connections: Not on file  Intimate Partner Violence: Not on file   Family History  Problem Relation Age of Onset   Cancer Mother        T cell lymphoma   Coronary artery disease Father    Congestive Heart Failure Father    Asthma Father    BP 120/84   Pulse 69   Wt 67.4 kg (148 lb 9.6 oz)   SpO2 98%   BMI 24.73 kg/m   Wt Readings from Last 3 Encounters:  12/27/20 67.4 kg (148 lb 9.6 oz)  12/13/20 67.1 kg (148 lb)  11/29/20 65.3 kg  (144 lb)   PHYSICAL EXAM: General:  Well appearing. No resp difficulty HEENT: normal Neck: supple. no JVD. Carotids 2+ bilat; no bruits. No lymphadenopathy or thryomegaly appreciated. Cor: PMI nondisplaced. Regular rate & rhythm. No rubs, gallops or murmurs. Lungs: clear Abdomen: soft, nontender, nondistended. No hepatosplenomegaly. No bruits or masses. Good bowel sounds. Insulin pump in place Extremities: no cyanosis, clubbing, rash, edema Neuro: alert & orientedx3, cranial nerves grossly intact. moves all 4 extremities w/o difficulty. Affect pleasant   ASSESSMENT & PLAN:  Chronic HFrEF, ICM - s/p CABG 10/20 - Echo in 2020 showed EF 55-60%.  - Most recent Echo 09/2020 EF 20-25%.  - LHC with 09/2020 with severe coronary disease. total occlusion of the proximal LAD, moderate stenosis of the intermediate branch, and severe ostial circumflex in-stent restenosis.  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 EF recovering despite lack of compliance/intolerance to meds. Focus on taking Toprol every night and walk 70mins twice daily.  - If potassium remains elevated can consider potassium binder to permit use of low-dose ARB which will also protect her kidneys in setting of DM2 - Agree with plan to apply for disability to help facilitate her health care - Labs today  2. CAD  - H/O CABG 10/20 - NSTEMI in setting of DKA 9/22. LHC severe coronary disease with total occlusion of the proximal LAD, moderate stenosis of the intermediate branch, and severe ostial circumflex in-stent restenosis. Patent LIMA to LAD-> RI and RIMA -> LCX - No s/s of significant  angina - On aspirin, plavix.  - Not  taking statin as she forgets to take it  - Unable to do CR with no insurance.   3. Hypothyroidism  - Followed by Dr Loanne Drilling    4. DMI  - Uncontrolled. Followed by Dr Loanne Drilling.  - Last A1c was 8.5 - Wearing CGM device  - No SGLT2i. - See discussion above about ARB/potassium binder  Glori Bickers, MD 12/27/20

## 2020-12-27 NOTE — Patient Instructions (Signed)
Medication Changes:  Take your statin and Metoprolol  Lab Work:  Labs done today, your results will be available in MyChart, we will contact you for abnormal readings.   Testing/Procedures:  none  Referrals:  none  Special Instructions // Education:  Take medication as prescribed  Follow-Up in: 4 months (April 2023) **Call in March for appointment**  At the [facility], you and your health needs are our priority. We have a designated team specialized in the treatment of Heart Failure. This Care Team includes your primary Heart Failure Specialized Cardiologist (physician), Advanced Practice Providers (APPs- Physician Assistants and Nurse Practitioners), and Pharmacist who all work together to provide you with the care you need, when you need it.   You may see any of the following providers on your designated Care Team at your next follow up:  Dr Glori Bickers Dr Haynes Kerns, NP Lyda Jester, [place] [facility] [place], [place] Audry Riles, PharmD   Please be sure to bring in all your medications bottles to every appointment.   Need to Contact us:  If you have any questions or concerns before your next appointment please send [name] a message through [place] or call our office at [phone].    TO LEAVE A MESSAGE FOR THE NURSE SELECT OPTION 2, PLEASE LEAVE A MESSAGE INCLUDING: YOUR NAME DATE OF BIRTH CALL BACK NUMBER REASON FOR CALL**this is important as we prioritize the call backs  YOU WILL RECEIVE A CALL BACK THE SAME DAY AS LONG AS YOU CALL BEFORE 4:00 PM

## 2020-12-27 NOTE — Addendum Note (Signed)
Encounter addended by: Jorge Ny, LCSW on: 12/27/2020 3:20 PM  Actions taken: Clinical Note Signed

## 2020-12-27 NOTE — Progress Notes (Signed)
CSW checking in with pt during appt as she had not returned previous phone calls.  Confirmed number in the chart is the correct number.  Pt confirms she was able to find her [employer] paperwork to continue working on her disability case- she will plan to sign when she returns home and send it back to [employer].  Pt reports some car trouble getting here today- informed of [employer] and encouraged to reach out if she had any issues in the future.  Will continue to follow and assist as needed  Jorge Ny, [facility] Desk#: [phone] Cell#: [phone]

## 2020-12-27 NOTE — Addendum Note (Signed)
Encounter addended by: Jerl Mina, RN on: 12/27/2020 2:56 PM  Actions taken: Order list changed, Diagnosis association updated, Charge Capture section accepted, Clinical Note Signed

## 2020-12-30 ENCOUNTER — Other Ambulatory Visit: Payer: Self-pay

## 2021-01-02 ENCOUNTER — Other Ambulatory Visit: Payer: Self-pay

## 2021-01-02 ENCOUNTER — Encounter (HOSPITAL_COMMUNITY): Payer: Self-pay | Admitting: Emergency Medicine

## 2021-01-02 ENCOUNTER — Emergency Department (HOSPITAL_COMMUNITY): Payer: Medicaid Other

## 2021-01-02 ENCOUNTER — Observation Stay (HOSPITAL_COMMUNITY)
Admission: EM | Admit: 2021-01-02 | Discharge: 2021-01-03 | Disposition: A | Discharge status: Home / Self Care | Payer: Medicaid Other | Attending: Internal Medicine | Admitting: Internal Medicine

## 2021-01-02 DIAGNOSIS — Z7982 Long term (current) use of aspirin: Secondary | ICD-10-CM | POA: Insufficient documentation

## 2021-01-02 DIAGNOSIS — E039 Hypothyroidism, unspecified: Secondary | ICD-10-CM | POA: Insufficient documentation

## 2021-01-02 DIAGNOSIS — I5022 Chronic systolic (congestive) heart failure: Secondary | ICD-10-CM | POA: Diagnosis present

## 2021-01-02 DIAGNOSIS — N179 Acute kidney failure, unspecified: Secondary | ICD-10-CM | POA: Diagnosis present

## 2021-01-02 DIAGNOSIS — E1059 Type 1 diabetes mellitus with other circulatory complications: Secondary | ICD-10-CM

## 2021-01-02 DIAGNOSIS — I2510 Atherosclerotic heart disease of native coronary artery without angina pectoris: Secondary | ICD-10-CM | POA: Insufficient documentation

## 2021-01-02 DIAGNOSIS — Z794 Long term (current) use of insulin: Secondary | ICD-10-CM | POA: Insufficient documentation

## 2021-01-02 DIAGNOSIS — Z91010 Allergy to peanuts: Secondary | ICD-10-CM | POA: Insufficient documentation

## 2021-01-02 DIAGNOSIS — Z7902 Long term (current) use of antithrombotics/antiplatelets: Secondary | ICD-10-CM | POA: Insufficient documentation

## 2021-01-02 DIAGNOSIS — Z79899 Other long term (current) drug therapy: Secondary | ICD-10-CM | POA: Insufficient documentation

## 2021-01-02 DIAGNOSIS — E1010 Type 1 diabetes mellitus with ketoacidosis without coma: Principal | ICD-10-CM | POA: Insufficient documentation

## 2021-01-02 DIAGNOSIS — Z20822 Contact with and (suspected) exposure to covid-19: Secondary | ICD-10-CM | POA: Insufficient documentation

## 2021-01-02 DIAGNOSIS — I5023 Acute on chronic systolic (congestive) heart failure: Secondary | ICD-10-CM | POA: Diagnosis present

## 2021-01-02 DIAGNOSIS — E109 Type 1 diabetes mellitus without complications: Secondary | ICD-10-CM | POA: Diagnosis present

## 2021-01-02 DIAGNOSIS — E7800 Pure hypercholesterolemia, unspecified: Secondary | ICD-10-CM | POA: Diagnosis present

## 2021-01-02 DIAGNOSIS — Z951 Presence of aortocoronary bypass graft: Secondary | ICD-10-CM | POA: Insufficient documentation

## 2021-01-02 LAB — I-STAT BETA HCG BLOOD, ED (MC, WL, AP ONLY): I-stat hCG, quantitative: <5 m[IU]/mL (ref <5)

## 2021-01-02 LAB — CBC
HCT: 40.5 % (ref 36.0–46.0)
Hemoglobin: 14 g/dL (ref 12.0–15.0)
MCH: 30.2 pg (ref 26.0–34.0)
MCHC: 34.6 g/dL (ref 30.0–36.0)
MCV: 87.3 fL (ref 80.0–100.0)
Platelets: 527 10*3/uL — ABNORMAL HIGH (ref 150–400)
RBC: 4.64 MIL/uL (ref 3.87–5.11)
RDW: 12.6 % (ref 11.5–15.5)
WBC: 15.4 10*3/uL — ABNORMAL HIGH (ref 4.0–10.5)
nRBC: 0 % (ref 0.0–0.2)

## 2021-01-02 LAB — HEPATIC FUNCTION PANEL
ALT: 30 U/L (ref 0–44)
AST: 45 U/L — ABNORMAL HIGH (ref 15–41)
Albumin: 4.2 g/dL (ref 3.5–5.0)
Alkaline Phosphatase: 88 U/L (ref 38–126)
Bilirubin, Direct: <0.1 mg/dL (ref 0.0–0.2)
Total Bilirubin: 1 mg/dL (ref 0.3–1.2)
Total Protein: 8 g/dL (ref 6.5–8.1)

## 2021-01-02 LAB — URINALYSIS, ROUTINE W REFLEX MICROSCOPIC
Bilirubin Urine: NEGATIVE
Glucose, UA: 50 mg/dL — AB
Ketones, ur: 20 mg/dL — AB
Leukocytes,Ua: NEGATIVE
Nitrite: NEGATIVE
Protein, ur: 100 mg/dL — AB
Specific Gravity, Urine: 1.02 (ref 1.005–1.030)
pH: 5 (ref 5.0–8.0)

## 2021-01-02 LAB — BASIC METABOLIC PANEL
Anion gap: 16 — ABNORMAL HIGH (ref 5–15)
BUN: 33 mg/dL — ABNORMAL HIGH (ref 6–20)
CO2: 13 mmol/L — ABNORMAL LOW (ref 22–32)
Calcium: 9.8 mg/dL (ref 8.9–10.3)
Chloride: 101 mmol/L (ref 98–111)
Creatinine, Ser: 1.29 mg/dL — ABNORMAL HIGH (ref 0.44–1.00)
GFR, Estimated: 50 mL/min — ABNORMAL LOW (ref >60)
Glucose, Bld: 159 mg/dL — ABNORMAL HIGH (ref 70–99)
Potassium: 3.3 mmol/L — ABNORMAL LOW (ref 3.5–5.1)
Sodium: 130 mmol/L — ABNORMAL LOW (ref 135–145)

## 2021-01-02 LAB — I-STAT VENOUS BLOOD GAS, ED
Acid-base deficit: 7 mmol/L — ABNORMAL HIGH (ref 0.0–2.0)
Bicarbonate: 14.1 mmol/L — ABNORMAL LOW (ref 20.0–28.0)
Calcium, Ion: 1.02 mmol/L — ABNORMAL LOW (ref 1.15–1.40)
HCT: 40 % (ref 36.0–46.0)
Hemoglobin: 13.6 g/dL (ref 12.0–15.0)
O2 Saturation: 99 %
Potassium: 3.3 mmol/L — ABNORMAL LOW (ref 3.5–5.1)
Sodium: 132 mmol/L — ABNORMAL LOW (ref 135–145)
TCO2: 15 mmol/L — ABNORMAL LOW (ref 22–32)
pCO2, Ven: 19 mmHg — CL (ref 44.0–60.0)
pH, Ven: 7.48 — ABNORMAL HIGH (ref 7.250–7.430)
pO2, Ven: 137 mmHg — ABNORMAL HIGH (ref 32.0–45.0)

## 2021-01-02 LAB — CBG MONITORING, ED
Glucose-Capillary: 168 mg/dL — ABNORMAL HIGH (ref 70–99)
Glucose-Capillary: 156 mg/dL — ABNORMAL HIGH (ref 70–99)
Glucose-Capillary: 155 mg/dL — ABNORMAL HIGH (ref 70–99)

## 2021-01-02 LAB — BETA-HYDROXYBUTYRIC ACID: Beta-Hydroxybutyric Acid: 2.68 mmol/L — ABNORMAL HIGH (ref 0.05–0.27)

## 2021-01-02 LAB — RESP PANEL BY RT-PCR (FLU A&B, COVID) ARPGX2
Influenza A by PCR: NEGATIVE
Influenza B by PCR: NEGATIVE
SARS Coronavirus 2 by RT PCR: NEGATIVE

## 2021-01-02 LAB — TROPONIN I (HIGH SENSITIVITY)
Troponin I (High Sensitivity): 33 ng/L — ABNORMAL HIGH (ref <18)
Troponin I (High Sensitivity): 25 ng/L — ABNORMAL HIGH (ref <18)

## 2021-01-02 MED ORDER — ENOXAPARIN SODIUM 40 MG/0.4ML IJ SOSY
40.0000 mg | PREFILLED_SYRINGE | INTRAMUSCULAR | Status: DC
  Administered 2021-01-03: 40 mg via SUBCUTANEOUS
  Filled 2021-01-02: qty 0.4

## 2021-01-02 MED ORDER — SODIUM CHLORIDE 0.9 % IV BOLUS
1000.0000 mL | Freq: Once | INTRAVENOUS | Status: AC
  Administered 2021-01-02: 1000 mL via INTRAVENOUS

## 2021-01-02 MED ORDER — DEXTROSE IN LACTATED RINGERS 5 % IV SOLN: INTRAVENOUS | Status: DC

## 2021-01-02 MED ORDER — POTASSIUM CHLORIDE CRYS ER 20 MEQ PO TBCR
20.0000 meq | EXTENDED_RELEASE_TABLET | Freq: Two times a day (BID) | ORAL | Status: DC
  Administered 2021-01-02 – 2021-01-03 (×2): 20 meq via ORAL
  Filled 2021-01-02 (×2): qty 1

## 2021-01-02 MED ORDER — MONTELUKAST SODIUM 10 MG PO TABS
10.0000 mg | ORAL_TABLET | Freq: Every day | ORAL | Status: DC
  Administered 2021-01-03: 10 mg via ORAL
  Filled 2021-01-02 (×2): qty 1

## 2021-01-02 MED ORDER — ASPIRIN 81 MG PO CHEW
324.0000 mg | CHEWABLE_TABLET | Freq: Once | ORAL | Status: AC
  Administered 2021-01-02: 324 mg via ORAL
  Filled 2021-01-02: qty 4

## 2021-01-02 MED ORDER — POTASSIUM CHLORIDE 10 MEQ/100ML IV SOLN: 10.0000 meq | INTRAVENOUS | Status: DC

## 2021-01-02 MED ORDER — DEXTROSE 50 % IV SOLN: 0.0000 mL | INTRAVENOUS | Status: DC | PRN

## 2021-01-02 MED ORDER — INSULIN REGULAR(HUMAN) IN NACL 100-0.9 UT/100ML-% IV SOLN: INTRAVENOUS | Status: DC

## 2021-01-02 MED ORDER — LACTATED RINGERS IV SOLN: INTRAVENOUS | Status: DC

## 2021-01-02 MED ORDER — POTASSIUM CHLORIDE 10 MEQ/100ML IV SOLN
10.0000 meq | INTRAVENOUS | Status: AC
  Administered 2021-01-02 (×2): 10 meq via INTRAVENOUS
  Filled 2021-01-02 (×2): qty 100

## 2021-01-02 MED ORDER — FLUOXETINE HCL 20 MG PO CAPS
40.0000 mg | ORAL_CAPSULE | Freq: Every day | ORAL | Status: DC
  Administered 2021-01-03: 40 mg via ORAL
  Filled 2021-01-02: qty 2

## 2021-01-02 MED ORDER — ASPIRIN EC 81 MG PO TBEC
81.0000 mg | DELAYED_RELEASE_TABLET | Freq: Every day | ORAL | Status: DC
  Administered 2021-01-03: 81 mg via ORAL
  Filled 2021-01-02: qty 1

## 2021-01-02 MED ORDER — MORPHINE SULFATE (PF) 4 MG/ML IV SOLN
4.0000 mg | Freq: Once | INTRAVENOUS | Status: AC
  Administered 2021-01-02: 4 mg via INTRAVENOUS
  Filled 2021-01-02: qty 1

## 2021-01-02 MED ORDER — INSULIN REGULAR(HUMAN) IN NACL 100-0.9 UT/100ML-% IV SOLN
INTRAVENOUS | Status: DC
  Administered 2021-01-02: 1.8 [IU]/h via INTRAVENOUS
  Filled 2021-01-02: qty 100

## 2021-01-02 MED ORDER — METOPROLOL SUCCINATE ER 25 MG PO TB24
12.5000 mg | ORAL_TABLET | Freq: Every day | ORAL | Status: DC
  Administered 2021-01-03: 12.5 mg via ORAL
  Filled 2021-01-02: qty 1

## 2021-01-02 MED ORDER — LEVOTHYROXINE SODIUM 100 MCG PO TABS
100.0000 ug | ORAL_TABLET | Freq: Every day | ORAL | Status: DC
  Administered 2021-01-03: 100 ug via ORAL
  Filled 2021-01-02: qty 1

## 2021-01-02 MED ORDER — METOCLOPRAMIDE HCL 5 MG/ML IJ SOLN
10.0000 mg | Freq: Once | INTRAMUSCULAR | Status: AC
  Administered 2021-01-02: 10 mg via INTRAVENOUS
  Filled 2021-01-02: qty 2

## 2021-01-02 MED ORDER — SODIUM CHLORIDE 0.9 % IV SOLN
INTRAVENOUS | Status: DC
Start: 2021-01-02 — End: 2021-01-03

## 2021-01-02 MED ORDER — DIPHENHYDRAMINE HCL 25 MG PO CAPS: 25.0000 mg | ORAL_CAPSULE | Freq: Every evening | ORAL | Status: DC | PRN

## 2021-01-02 NOTE — Assessment & Plan Note (Signed)
Continue Synthroid °

## 2021-01-02 NOTE — Subjective & Objective (Signed)
CC: N/V, hyperglycemia HPI: 50 year old female with a history of type 1 diabetes greater than 40 years, history of ischemic cardiomyopathy, chronic systolic heart failure, hyperlipidemia presents to the ER today with a 2 to 3-day history of hyperglycemia at home.  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  She states that she has not been eating.  She states that this is her normal routine prior to the end of the semester.  She states that by time she relates that she was feeling hungry, she was already hyperglycemic.  She states that she is increased her subcutaneous insulin pump rate but her blood sugars are still greater than 300.  She started having nausea vomiting yesterday.  She has been unable to control this at home.  She has now having dry heaves.  Patient states has been several years since her last DKA episode.  Laboratory evaluation sodium 130, bicarb 13, creatinine 1.29, serum glucose 159  Venous blood gas pH 7.48 with a PCO2 of 19  Beta hydroxybutyrate is +2.68.  Chest x-ray negative for any cardiopulmonary disease.  Due to the patient's DKA, [facility] hospitalist contacted for admission.

## 2021-01-02 NOTE — ED Triage Notes (Signed)
Per [facility] pt coming from home reports having difficulty controlling her blood sugars over the past 3 days along with shortness and chest wall pain. Patient reports giving herself an insulin bolus on her pump at about 1750 this evening. 150 CC NS and 4mg  zofran given en route by EMS. Patient placed on 2L [place] at request by patient. A&0x4.

## 2021-01-02 NOTE — Assessment & Plan Note (Signed)
Continue IV fluids.  Monitor serum creatinine.

## 2021-01-02 NOTE — Assessment & Plan Note (Signed)
EF of 40 to 45% based on echo last week.  We will need to monitor her fluid status carefully.  Likely she will need to start Lasix tomorrow if she is resolved her DKA.

## 2021-01-02 NOTE — Assessment & Plan Note (Signed)
Admit progressive telemetry bed. DKA protocol. Will need to start Dextrose containing fluid initially due to serum glucose <200.  Follow BMPs every 4 hours.  Hopefully her DKA can turn around and next 8 hours.

## 2021-01-02 NOTE — H&P (Signed)
History and Physical    Jennifer Hogan DOB: 09/25/1970 DOA: 01/02/2021  PCP: [name], NP   Patient coming from: Home  I have personally briefly reviewed patient's old medical records in [place]  CC: N/V, hyperglycemia HPI: 50 year old female with a history of type 1 diabetes greater than 40 years, history of ischemic cardiomyopathy, chronic systolic heart failure, hyperlipidemia presents to the ER today with a 2 to 3-day history of hyperglycemia at home.  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  She states that she has not been eating.  She states that this is her normal routine prior to the end of the semester.  She states that by time she relates that she was feeling hungry, she was already hyperglycemic.  She states that she is increased her subcutaneous insulin pump rate but her blood sugars are still greater than 300.  She started having nausea vomiting yesterday.  She has been unable to control this at home.  She has now having dry heaves.  Patient states has been several years since her last DKA episode.  Laboratory evaluation sodium 130, bicarb 13, creatinine 1.29, serum glucose 159  Venous blood gas pH 7.48 with a PCO2 of 19  Beta hydroxybutyrate is +2.68.  Chest x-ray negative for any cardiopulmonary disease.  Due to the patient's DKA, [facility] contacted for admission.   ED Course: labs notable for DKA.  Started on insulin gtts.  Review of Systems:  Review of Systems  Constitutional:  Positive for malaise/fatigue and weight loss.  HENT: Negative.    Eyes: Negative.   Respiratory: Negative.    Cardiovascular: Negative.   Gastrointestinal:  Positive for abdominal pain, nausea and vomiting.  Genitourinary: Negative.   Musculoskeletal: Negative.   Skin: Negative.   Neurological: Negative.   Endo/Heme/Allergies:  Positive for polydipsia.       Hyperglycemia  despite SQ insulin pump  Psychiatric/Behavioral: Negative.    All other systems reviewed and are negative.  Past Medical History:  Diagnosis Date   Anemia    Anxiety    Coronary artery disease    a. s/p PTCA DES x3 in the LAD (ostial DES placed, mid DES placed, distal DES placed) and PTCA/DES x1 to intermediate branch 02/2017. b. ACS 02/08/18 with LCx stent placement.   Depression    Diabetes mellitus    type 1, on insulin pump   DKA (diabetic ketoacidoses) 12/31/2017   Elevated platelet count    High cholesterol    Hypothyroidism    Ischemic cardiomyopathy    a. EF low-normal with basal inferior akinesis, basal septal hypokinesis by echo 01/2018.    Past Surgical History:  Procedure Laterality Date   APPENDECTOMY     CORONARY ARTERY BYPASS GRAFT N/A 11/04/2018   Procedure: CORONARY ARTERY BYPASS GRAFTING (CABG), ON PUMP, TIMES THREE, USING LEFT AND RIGHT INTERNAL MAMMARY ARTERIES;  Surgeon: Wonda Olds, MD;  Location: [facility];  Service: Open Heart Surgery;  Laterality: N/A;   CORONARY STENT INTERVENTION N/A 03/20/2017   Procedure: DES x 3 LAD, DES OM; Surgeon: Burnell Blanks, MD;  Location: [facility];  Service: Cardiovascular;  Laterality: N/A;   CORONARY/GRAFT ACUTE MI REVASCULARIZATION N/A 02/08/2018   Procedure: Coronary/Graft Acute MI Revascularization;  Surgeon: Belva Crome, MD;  Location: [facility];  Service: Cardiovascular;  Laterality: N/A;   LEFT HEART CATH AND CORONARY ANGIOGRAPHY N/A 03/20/2017  Procedure: LEFT HEART CATH AND CORONARY ANGIOGRAPHY;  Surgeon: Burnell Blanks, MD;  Location: [facility];  Service: Cardiovascular;  Laterality: N/A;   LEFT HEART CATH AND CORONARY ANGIOGRAPHY N/A 02/08/2018   Procedure: LEFT HEART CATH AND CORONARY ANGIOGRAPHY;  Surgeon: Belva Crome, MD;  Location: [facility];  Service: Cardiovascular;  Laterality: N/A;   LEFT HEART CATH AND CORONARY ANGIOGRAPHY N/A 10/29/2018   Procedure:  LEFT HEART CATH AND CORONARY ANGIOGRAPHY;  Surgeon: Burnell Blanks, MD;  Location: [facility];  Service: Cardiovascular;  Laterality: N/A;   LEFT HEART CATH AND CORS/GRAFTS ANGIOGRAPHY N/A 10/04/2020   Procedure: LEFT HEART CATH AND CORS/GRAFTS ANGIOGRAPHY;  Surgeon: Sherren Mocha, MD;  Location: [facility];  Service: Cardiovascular;  Laterality: N/A;   TEE WITHOUT CARDIOVERSION N/A 11/04/2018   Procedure: TRANSESOPHAGEAL ECHOCARDIOGRAM (TEE);  Surgeon: Wonda Olds, MD;  Location: [facility];  Service: Open Heart Surgery;  Laterality: N/A;     reports that she has never smoked. She has never used smokeless tobacco. She reports that she does not currently use drugs after having used the following drugs: Marijuana. She reports that she does not drink alcohol.  Allergies  Allergen Reactions   Atorvastatin Other (See Comments)    Myalgias on 80mg  dose, tolerates 20mg  ok   Crestor [Rosuvastatin Calcium] Other (See Comments)    Severe myalgias and joint pain. Has tolerated atorvastatin though   Monosodium Glutamate Nausea And Vomiting and Other (See Comments)    migraine   Peanut-Containing Drug Products Other (See Comments)    Vomiting, upset stomach and some wheezing   Zetia [Ezetimibe] Other (See Comments)    myalgias   Ciprofloxin Hcl [Ciprofloxacin] Nausea And Vomiting and Other (See Comments)    Severe migraine   Food Color Red [Red Dye] Diarrhea    Family History  Problem Relation Age of Onset   Cancer Mother        T cell lymphoma   Coronary artery disease Father    Congestive Heart Failure Father    Asthma Father     Prior to Admission medications   Medication Sig Start Date End Date Taking? Authorizing Provider  acetaminophen (TYLENOL) 325 MG tablet Take 650 mg by mouth daily as needed (couch/pain/fever).    [provider]  albuterol (VENTOLIN HFA) 108 (90 Base) MCG/ACT inhaler INHALE 2 PUFFS INTO THE LUNGS EVERY 6 (SIX) HOURS AS NEEDED  FOR WHEEZING OR SHORTNESS OF BREATH. 12/01/20 12/01/21  Charlott Rakes, MD  aspirin 81 MG EC tablet Take 1 tablet (81 mg total) by mouth daily. 12/07/17   Amin, Jeanella Flattery, MD  atorvastatin (LIPITOR) 20 MG tablet Take 1 tablet (20 mg total) by mouth daily. 10/07/20   Swayze, Ava, DO  b complex vitamins tablet Take 1 tablet by mouth daily.    [provider]  budesonide (PULMICORT) 0.25 MG/2ML nebulizer solution Take 0.25 mg by nebulization as needed.    [provider]  clopidogrel (PLAVIX) 75 MG tablet TAKE 1 TABLET BY MOUTH DAILY WITH BREAKFAST. 11/01/20   [name], MD  diclofenac Sodium (VOLTAREN) 1 % GEL Apply topically as needed.    [provider]  FLUoxetine (PROZAC) 40 MG capsule TAKE 1 CAPSULE BY MOUTH DAILY. 05/21/20 05/21/21  [name], NP  fluticasone (FLONASE) 50 MCG/ACT nasal spray Place 1 spray into both nostrils at bedtime as needed for allergies.    [provider]  furosemide (LASIX) 20 MG  tablet Take 1 tablet (20 mg total) by mouth daily as needed (for weight gain). 10/14/20   Clegg, Amy D, NP  hydrOXYzine (ATARAX/VISTARIL) 25 MG tablet Take 1 tablet (25 mg total) by mouth 3 (three) times daily as needed. 03/19/19   [name], NP  Insulin Human (INSULIN PUMP) SOLN Inject into the skin continuous. Humalog    [provider]  insulin lispro (HUMALOG) 100 UNIT/ML injection Inject 35 units into the skin daily via pump 12/01/20 12/01/21  Renato Shin, MD  ipratropium (ATROVENT) 0.06 % nasal spray Place 2 sprays into both nostrils 2 (two) times daily as needed for rhinitis.    [provider]  levothyroxine (SYNTHROID) 100 MCG tablet TAKE 1 TABLET (100 MCG TOTAL) BY MOUTH DAILY BEFORE BREAKFAST. 12/01/20 12/01/21  Renato Shin, MD  metoCLOPramide (REGLAN) 5 MG tablet Take 1 tablet (5 mg total) by mouth every 12 (twelve) hours as needed for nausea or vomiting. 12/17/18 10/14/21  Argentina Donovan, PA-C  metoprolol  succinate (TOPROL-XL) 25 MG 24 hr tablet Take 0.5 tablets (12.5 mg total) by mouth daily. 10/07/20   Swayze, Ava, DO  montelukast (SINGULAIR) 10 MG tablet TAKE 1 TABLET (10 MG TOTAL) BY MOUTH AT BEDTIME. 12/01/20 12/01/21  [name], NP  nitroGLYCERIN (NITROSTAT) 0.4 MG SL tablet Place 1 tablet (0.4 mg total) under the tongue every 5 (five) minutes as needed for chest pain. 10/14/20   Clegg, Amy D, NP  ondansetron (ZOFRAN-ODT) 4 MG disintegrating tablet TAKE 1 TABLET (4 MG TOTAL) BY MOUTH EVERY 8 (EIGHT) HOURS AS NEEDED FOR NAUSEA OR VOMITING. 02/27/20 02/26/21  [name], NP  pantoprazole (PROTONIX) 40 MG tablet Take 1 tablet (40 mg total) by mouth 2 (two) times daily as needed (acid reflux). 06/03/19 10/14/21  [name], NP    Physical Exam: Vitals:   01/02/21 1930 01/02/21 2015 01/02/21 2045 01/02/21 2100  BP: (!) 183/90 112/68 106/64 107/67  Pulse: 98 (!) 107 97 92  Resp: 14 20 14  (!) 21  Temp:      TempSrc:      SpO2: 99% 100% 100% 100%  Weight:      Height:        Physical Exam Vitals and nursing note reviewed.  Constitutional:      General: She is not in acute distress.    Appearance: She is well-developed and normal weight. She is not ill-appearing, toxic-appearing or diaphoretic.  HENT:     Head: Normocephalic and atraumatic.  Eyes:     General: No scleral icterus.       Right eye: No discharge.        Left eye: No discharge.     Pupils: Pupils are equal, round, and reactive to light.  Cardiovascular:     Rate and Rhythm: Regular rhythm. Tachycardia present.     Pulses: Normal pulses.     Heart sounds: No murmur heard.   No gallop.  Pulmonary:     Effort: Pulmonary effort is normal. No respiratory distress.     Breath sounds: Normal breath sounds. No wheezing or rales.  Abdominal:     General: Abdomen is flat.     Palpations: Abdomen is soft.     Tenderness: There is abdominal tenderness. There is no guarding or rebound.     Comments: Muscular  tenderness of abd muscle. Likely due to repeated vomiting. No pain with deep palpation.  Musculoskeletal:     Right lower leg: No edema.  Left lower leg: No edema.  Skin:    General: Skin is warm and dry.     Capillary Refill: Capillary refill takes less than 2 seconds.  Neurological:     General: No focal deficit present.     Mental Status: She is alert and oriented to person, place, and time.     Labs on Admission: I have personally reviewed following labs and imaging studies  CBC: Recent Labs  Lab 01/02/21 1840 01/02/21 1926  WBC 15.4*  --   HGB 14.0 13.6  HCT 40.5 40.0  MCV 87.3  --   PLT 527*  --    Basic Metabolic Panel: Recent Labs  Lab 12/27/20 1454 01/02/21 1840 01/02/21 1926  NA 137 130* 132*  K 4.4 3.3* 3.3*  CL 105 101  --   CO2 27 13*  --   GLUCOSE 95 159*  --   BUN 11 33*  --   CREATININE 0.80 1.29*  --   CALCIUM 9.0 9.8  --    GFR: Estimated Creatinine Clearance: 46.4 mL/min (A) (by C-G formula based on SCr of 1.29 mg/dL (H)). Liver Function Tests: Recent Labs  Lab 12/27/20 1454 01/02/21 1906  AST 25 45*  ALT 16 30  ALKPHOS 79 88  BILITOT 0.3 1.0  PROT 7.2 8.0  ALBUMIN 3.8 4.2   No results for input(s): LIPASE, AMYLASE in the last 168 hours. No results for input(s): AMMONIA in the last 168 hours. Coagulation Profile: No results for input(s): INR, PROTIME in the last 168 hours. Cardiac Enzymes: No results for input(s): CKTOTAL, CKMB, CKMBINDEX, TROPONINI in the last 168 hours. BNP (last 3 results) No results for input(s): PROBNP in the last 8760 hours. HbA1C: No results for input(s): HGBA1C in the last 72 hours. CBG: Recent Labs  Lab 01/02/21 1820  GLUCAP 168*   Lipid Profile: No results for input(s): CHOL, HDL, LDLCALC, TRIG, CHOLHDL, LDLDIRECT in the last 72 hours. Thyroid Function Tests: No results for input(s): TSH, T4TOTAL, FREET4, T3FREE, THYROIDAB in the last 72 hours. Anemia Panel: No results for input(s): VITAMINB12,  FOLATE, FERRITIN, TIBC, IRON, RETICCTPCT in the last 72 hours. Urine analysis:    Component Value Date/Time   COLORURINE YELLOW 01/02/2021 1840   APPEARANCEUR CLOUDY (A) 01/02/2021 1840   LABSPEC 1.020 01/02/2021 1840   PHURINE 5.0 01/02/2021 1840   GLUCOSEU 50 (A) 01/02/2021 1840   GLUCOSEU NEGATIVE 11/23/2015 0831   HGBUR SMALL (A) 01/02/2021 1840   BILIRUBINUR NEGATIVE 01/02/2021 1840   BILIRUBINUR neg 12/07/2015 0843   KETONESUR 20 (A) 01/02/2021 1840   PROTEINUR 100 (A) 01/02/2021 1840   UROBILINOGEN 1.0 12/07/2015 0843   UROBILINOGEN 0.2 11/23/2015 0831   NITRITE NEGATIVE 01/02/2021 1840   LEUKOCYTESUR NEGATIVE 01/02/2021 1840    Radiological Exams on Admission: I have personally reviewed images DG Chest Portable 1 View  Result Date: 01/02/2021 CLINICAL DATA:  Short of breath chest wall pain EXAM: PORTABLE CHEST 1 VIEW COMPARISON:  None. FINDINGS: Midline sternotomy overlies stable cardiac silhouette. Lungs hyperinflated. No effusion, infiltrate or pneumothorax. IMPRESSION: Hyperinflated lungs.  No acute findings. Electronically Signed   By: Suzy Bouchard M.D.   On: 01/02/2021 20:15    EKG: I have personally reviewed EKG: sinus tachycardia    Assessment/Plan Principal Problem:   DKA, type 1 (HCC) Active Problems:   AKI (acute kidney injury) ([place])   Hypothyroidism   Type 1 diabetes mellitus ([place])   Pure hypercholesterolemia   Chronic systolic heart failure ([place])  DKA, type 1 ([place]) Admit progressive telemetry bed. DKA protocol. Will need to start Dextrose containing fluid initially due to serum glucose <200.  Follow BMPs every 4 hours.  Hopefully her DKA can turn around and next 8 hours.  AKI (acute kidney injury) ([employer]) Continue IV fluids.  Monitor serum creatinine.  Hypothyroidism Continue Synthroid.  Type 1 diabetes mellitus ([place]) Patient has been on insulin pump for several years now.  She is a diabetic type I for greater than 40 years.  Pure  hypercholesterolemia Patient intolerant to statins.  She may benefit from seeing the lipid clinic for a PCSK9 inhibitor.  Chronic systolic heart failure (HCC) EF of 40 to 45% based on echo last week.  We will need to monitor her fluid status carefully.  Likely she will need to start Lasix tomorrow if she is resolved her DKA.  DVT prophylaxis: Lovenox Code Status: Full Code Family Communication: no family at bedside  Disposition Plan: return home  Consults called: none  Admission status: Observation, Step Down Unit   Kristopher Oppenheim, DO [facility] 01/02/2021, 9:54 PM

## 2021-01-02 NOTE — Assessment & Plan Note (Signed)
Patient intolerant to statins.  She may benefit from seeing the lipid clinic for a PCSK9 inhibitor.

## 2021-01-02 NOTE — Assessment & Plan Note (Signed)
Patient has been on insulin pump for several years now.  She is a diabetic type I for greater than 40 years.

## 2021-01-02 NOTE — ACP (Advance Care Planning) (Signed)
  Advance Care Planning  Reason for Advance Care Planning Conversation: Acute hospitalization Principal Problem:   DKA, type 1 ([place]) Active Problems:   AKI (acute kidney injury) ([place])   Hypothyroidism   Type 1 diabetes mellitus ([place])   Pure hypercholesterolemia   Chronic systolic heart failure ([place])    I discussed with patient about advance care planning. Specifically, we discussed whether patient would desire cardiopulmonary resuscitation (CPR) in the event of acute cardiopulmonary arrest. We also discussed whether endotracheal intubation and temporary ventilator life support would be desired in the event of acute cardio- or pulmonary decompensation.   Code status order of Full Code has been entered in accord with the patient's wishes. with intubation  Living will: no  Health Care Agent / Davonna Belling              Name: Seth Bake: Relationship to Patient: friend   Is agent appointed in legal document no  Time spent today in ACP discussion was  5 mins  Kristopher Oppenheim, DO [facility]

## 2021-01-02 NOTE — ED Notes (Addendum)
Pt's home insulin pump turned off per Dr. Bridgett Larsson.

## 2021-01-02 NOTE — ED Notes (Signed)
ED Provider at bedside. 

## 2021-01-02 NOTE — ED Provider Notes (Signed)
[facility] EMERGENCY DEPARTMENT Provider Note   CSN: [id number] Arrival date & time: 01/02/21  1817     History Chief Complaint  Patient presents with   Hyperglycemia   Shortness of Breath    Jennifer Hogan is a 50 y.o. female.  The history is provided by the patient and medical records.  Emesis Severity:  Moderate Duration:  3 days Timing:  Constant Number of daily episodes:  10+ Quality:  Stomach contents Feeding tolerance: None. Progression:  Unchanged Chronicity:  New Recent urination:  Decreased Relieved by:  Nothing Worsened by:  Nothing Associated symptoms: abdominal pain and chills   Associated symptoms: no arthralgias, no cough, no fever and no sore throat   Chest Pain Pain location:  Substernal area Pain quality: not burning   Pain radiates to:  Does not radiate Pain severity:  Moderate Onset quality:  Gradual Duration:  2 days Timing:  Constant Chronicity:  New Relieved by:  Nothing Worsened by:  Nothing Associated symptoms: abdominal pain, fatigue and nausea   Associated symptoms: no back pain, no cough, no fever, no palpitations, no shortness of breath and no vomiting       Past Medical History:  Diagnosis Date   Anemia    Anxiety    Coronary artery disease    a. s/p PTCA DES x3 in the LAD (ostial DES placed, mid DES placed, distal DES placed) and PTCA/DES x1 to intermediate branch 02/2017. b. ACS 02/08/18 with LCx stent placement.   Depression    Diabetes mellitus    type 1, on insulin pump   DKA (diabetic ketoacidoses) 12/31/2017   Elevated platelet count    High cholesterol    Hypothyroidism    Ischemic cardiomyopathy    a. EF low-normal with basal inferior akinesis, basal septal hypokinesis by echo 01/2018.    Patient Active Problem List   Diagnosis Date Noted   DCM (dilated cardiomyopathy) ([place])    Takotsubo cardiomyopathy    AKI (acute kidney injury) ([place]) 05/01/2019   Sinus tachycardia 11/24/2018   Chronic  systolic heart failure ([place])    Cardiomyopathy ([place])    DKA, type 1 ([place]) 05/24/2018   Moderate episode of recurrent major depressive disorder ([place]) 03/27/2018   Hyperglycemia due to type 1 diabetes mellitus ([place]) 02/14/2018   Hyperlipidemia LDL goal <70 02/14/2018   S/P drug eluting coronary stent placement    Non-ST elevation (NSTEMI) myocardial infarction ([place]) 02/08/2018   Marijuana abuse 12/31/2017   Gastroparesis 12/06/2017   Gastroparesis due to DM ([place]) 03/28/2017   Coronary artery disease 03/28/2017   Ischemic cardiomyopathy 03/28/2017   Pure hypercholesterolemia    NSTEMI (non-ST elevated myocardial infarction) ([place])    Anemia 03/17/2017   Allergic state 08/07/2015   Vitiligo 07/09/2012   Type 1 diabetes mellitus ([place]) 07/09/2012   Depression 12/04/2010   Non-intractable vomiting with nausea 12/04/2010   Hypothyroidism 12/04/2010    Past Surgical History:  Procedure Laterality Date   APPENDECTOMY     CORONARY ARTERY BYPASS GRAFT N/A 11/04/2018   Procedure: CORONARY ARTERY BYPASS GRAFTING (CABG), ON PUMP, TIMES THREE, USING LEFT AND RIGHT INTERNAL MAMMARY ARTERIES;  Surgeon: Wonda Olds, MD;  Location: [facility];  Service: Open Heart Surgery;  Laterality: N/A;   CORONARY STENT INTERVENTION N/A 03/20/2017   Procedure: DES x 3 LAD, DES OM; Surgeon: Burnell Blanks, MD;  Location: [facility];  Service: Cardiovascular;  Laterality: N/A;   CORONARY/GRAFT ACUTE MI REVASCULARIZATION N/A 02/08/2018  Procedure: Coronary/Graft Acute MI Revascularization;  Surgeon: Belva Crome, MD;  Location: [facility];  Service: Cardiovascular;  Laterality: N/A;   LEFT HEART CATH AND CORONARY ANGIOGRAPHY N/A 03/20/2017   Procedure: LEFT HEART CATH AND CORONARY ANGIOGRAPHY;  Surgeon: Burnell Blanks, MD;  Location: [facility];  Service: Cardiovascular;  Laterality: N/A;   LEFT HEART CATH AND CORONARY ANGIOGRAPHY N/A 02/08/2018   Procedure: LEFT HEART CATH  AND CORONARY ANGIOGRAPHY;  Surgeon: Belva Crome, MD;  Location: [facility];  Service: Cardiovascular;  Laterality: N/A;   LEFT HEART CATH AND CORONARY ANGIOGRAPHY N/A 10/29/2018   Procedure: LEFT HEART CATH AND CORONARY ANGIOGRAPHY;  Surgeon: Burnell Blanks, MD;  Location: [facility];  Service: Cardiovascular;  Laterality: N/A;   LEFT HEART CATH AND CORS/GRAFTS ANGIOGRAPHY N/A 10/04/2020   Procedure: LEFT HEART CATH AND CORS/GRAFTS ANGIOGRAPHY;  Surgeon: Sherren Mocha, MD;  Location: [facility];  Service: Cardiovascular;  Laterality: N/A;   TEE WITHOUT CARDIOVERSION N/A 11/04/2018   Procedure: TRANSESOPHAGEAL ECHOCARDIOGRAM (TEE);  Surgeon: Wonda Olds, MD;  Location: [facility];  Service: Open Heart Surgery;  Laterality: N/A;     OB History     Gravida  0   Para  0   Term  0   Preterm  0   AB  0   Living  0      SAB  0   IAB  0   Ectopic  0   Multiple  0   Live Births  0           Family History  Problem Relation Age of Onset   Cancer Mother        T cell lymphoma   Coronary artery disease Father    Congestive Heart Failure Father    Asthma Father     Social History   Tobacco Use   Smoking status: Never   Smokeless tobacco: Never   Tobacco comments:    Use marijuana  Vaping Use   Vaping Use: Never used  Substance Use Topics   Alcohol use: No    Alcohol/week: 0.0 standard drinks   Drug use: Not Currently    Types: Marijuana    Comment: last use last week, occaisonal marijuana use    Home Medications Prior to Admission medications   Medication Sig Start Date End Date Taking? Authorizing Provider  acetaminophen (TYLENOL) 325 MG tablet Take 650 mg by mouth daily as needed (couch/pain/fever).    [provider]  albuterol (VENTOLIN HFA) 108 (90 Base) MCG/ACT inhaler INHALE 2 PUFFS INTO THE LUNGS EVERY 6 (SIX) HOURS AS NEEDED FOR WHEEZING OR SHORTNESS OF BREATH. 12/01/20 12/01/21  Charlott Rakes, MD   aspirin 81 MG EC tablet Take 1 tablet (81 mg total) by mouth daily. 12/07/17   Amin, Jeanella Flattery, MD  atorvastatin (LIPITOR) 20 MG tablet Take 1 tablet (20 mg total) by mouth daily. 10/07/20   Swayze, Ava, DO  b complex vitamins tablet Take 1 tablet by mouth daily.    [provider]  budesonide (PULMICORT) 0.25 MG/2ML nebulizer solution Take 0.25 mg by nebulization as needed.    [provider]  clopidogrel (PLAVIX) 75 MG tablet TAKE 1 TABLET BY MOUTH DAILY WITH BREAKFAST. 11/01/20   [name], MD  diclofenac Sodium (VOLTAREN) 1 % GEL Apply topically as needed.    [provider]  FLUoxetine (PROZAC) 40 MG capsule TAKE 1 CAPSULE BY MOUTH DAILY.  05/21/20 05/21/21  [name], NP  fluticasone (FLONASE) 50 MCG/ACT nasal spray Place 1 spray into both nostrils at bedtime as needed for allergies.    [provider]  furosemide (LASIX) 20 MG tablet Take 1 tablet (20 mg total) by mouth daily as needed (for weight gain). 10/14/20   Clegg, Amy D, NP  hydrOXYzine (ATARAX/VISTARIL) 25 MG tablet Take 1 tablet (25 mg total) by mouth 3 (three) times daily as needed. 03/19/19   [name], NP  Insulin Human (INSULIN PUMP) SOLN Inject into the skin continuous. Humalog    [provider]  insulin lispro (HUMALOG) 100 UNIT/ML injection Inject 35 units into the skin daily via pump 12/01/20 12/01/21  Renato Shin, MD  ipratropium (ATROVENT) 0.06 % nasal spray Place 2 sprays into both nostrils 2 (two) times daily as needed for rhinitis.    [provider]  levothyroxine (SYNTHROID) 100 MCG tablet TAKE 1 TABLET (100 MCG TOTAL) BY MOUTH DAILY BEFORE BREAKFAST. 12/01/20 12/01/21  Renato Shin, MD  metoCLOPramide (REGLAN) 5 MG tablet Take 1 tablet (5 mg total) by mouth every 12 (twelve) hours as needed for nausea or vomiting. 12/17/18 10/14/21  Argentina Donovan, PA-C  metoprolol succinate (TOPROL-XL) 25 MG 24 hr tablet Take 0.5 tablets (12.5 mg total) by  mouth daily. 10/07/20   Swayze, Ava, DO  montelukast (SINGULAIR) 10 MG tablet TAKE 1 TABLET (10 MG TOTAL) BY MOUTH AT BEDTIME. 12/01/20 12/01/21  [name], NP  nitroGLYCERIN (NITROSTAT) 0.4 MG SL tablet Place 1 tablet (0.4 mg total) under the tongue every 5 (five) minutes as needed for chest pain. 10/14/20   Clegg, Amy D, NP  ondansetron (ZOFRAN-ODT) 4 MG disintegrating tablet TAKE 1 TABLET (4 MG TOTAL) BY MOUTH EVERY 8 (EIGHT) HOURS AS NEEDED FOR NAUSEA OR VOMITING. 02/27/20 02/26/21  [name], NP  pantoprazole (PROTONIX) 40 MG tablet Take 1 tablet (40 mg total) by mouth 2 (two) times daily as needed (acid reflux). 06/03/19 10/14/21  [name], NP    Allergies    Atorvastatin, Crestor [rosuvastatin calcium], Monosodium glutamate, Peanut-containing drug products, Zetia [ezetimibe], Ciprofloxin hcl [ciprofloxacin], and Food color red [red dye]  Review of Systems   Review of Systems  Constitutional:  Positive for chills and fatigue. Negative for fever.  HENT:  Negative for ear pain and sore throat.   Eyes:  Negative for pain and visual disturbance.  Respiratory:  Negative for cough and shortness of breath.   Cardiovascular:  Negative for chest pain and palpitations.  Gastrointestinal:  Positive for abdominal pain and nausea. Negative for vomiting.  Genitourinary:  Negative for dysuria and hematuria.  Musculoskeletal:  Negative for arthralgias and back pain.  Skin:  Negative for color change and rash.  Neurological:  Negative for seizures and syncope.  All other systems reviewed and are negative.  Physical Exam Updated Vital Signs BP 107/67   Pulse 92   Temp 99.3 F (37.4 C) (Oral)   Resp (!) 21   Ht 5\' 5"  (1.651 m)   Wt 67.1 kg   SpO2 100%   BMI 24.63 kg/m   Physical Exam Vitals and nursing note reviewed.  Constitutional:      General: She is not in acute distress.    Appearance: Normal appearance. She is well-developed. She is ill-appearing.  HENT:     Head:  Normocephalic and atraumatic.     Right Ear: External ear normal.     Left Ear: External ear normal.  Nose: Nose normal. No congestion or rhinorrhea.     Mouth/Throat:     Mouth: Mucous membranes are moist.  Eyes:     Extraocular Movements: Extraocular movements intact.     Conjunctiva/sclera: Conjunctivae normal.     Pupils: Pupils are equal, round, and reactive to light.  Cardiovascular:     Rate and Rhythm: Normal rate and regular rhythm.     Pulses: Normal pulses.     Heart sounds: No murmur heard. Pulmonary:     Effort: Pulmonary effort is normal. Tachypnea present. No respiratory distress.     Breath sounds: Normal breath sounds. No wheezing, rhonchi or rales.  Abdominal:     General: Abdomen is flat. Bowel sounds are normal.     Palpations: Abdomen is soft.     Tenderness: There is no abdominal tenderness. There is no guarding or rebound.  Musculoskeletal:        General: No swelling, tenderness or deformity.     Cervical back: Normal range of motion and neck supple. No rigidity.     Right lower leg: Edema present.     Left lower leg: Edema present.  Skin:    General: Skin is warm and dry.     Capillary Refill: Capillary refill takes less than 2 seconds.  Neurological:     General: No focal deficit present.     Mental Status: She is alert and oriented to person, place, and time.  Psychiatric:        Mood and Affect: Mood normal.    ED Results / Procedures / Treatments   Labs (all labs ordered are listed, but only abnormal results are displayed) Labs Reviewed  BASIC METABOLIC PANEL - Abnormal; Notable for the following components:      Result Value   Sodium 130 (*)    Potassium 3.3 (*)    CO2 13 (*)    Glucose, Bld 159 (*)    BUN 33 (*)    Creatinine, Ser 1.29 (*)    GFR, Estimated 50 (*)    Anion gap 16 (*)    All other components within normal limits  CBC - Abnormal; Notable for the following components:   WBC 15.4 (*)    Platelets 527 (*)    All other  components within normal limits  URINALYSIS, ROUTINE W REFLEX MICROSCOPIC - Abnormal; Notable for the following components:   APPearance CLOUDY (*)    Glucose, UA 50 (*)    Hgb urine dipstick SMALL (*)    Ketones, ur 20 (*)    Protein, ur 100 (*)    Bacteria, UA FEW (*)    All other components within normal limits  BETA-HYDROXYBUTYRIC ACID - Abnormal; Notable for the following components:   Beta-Hydroxybutyric Acid 2.68 (*)    All other components within normal limits  HEPATIC FUNCTION PANEL - Abnormal; Notable for the following components:   AST 45 (*)    All other components within normal limits  CBG MONITORING, ED - Abnormal; Notable for the following components:   Glucose-Capillary 168 (*)    All other components within normal limits  I-STAT VENOUS BLOOD GAS, ED - Abnormal; Notable for the following components:   pH, Ven 7.480 (*)    pCO2, Ven 19.0 (*)    pO2, Ven 137.0 (*)    Bicarbonate 14.1 (*)    TCO2 15 (*)    Acid-base deficit 7.0 (*)    Sodium 132 (*)    Potassium 3.3 (*)  Calcium, Ion 1.02 (*)    All other components within normal limits  TROPONIN I (HIGH SENSITIVITY) - Abnormal; Notable for the following components:   Troponin I (High Sensitivity) 33 (*)    All other components within normal limits  TROPONIN I (HIGH SENSITIVITY) - Abnormal; Notable for the following components:   Troponin I (High Sensitivity) 25 (*)    All other components within normal limits  RESP PANEL BY RT-PCR (FLU A&B, COVID) ARPGX2  I-STAT BETA HCG BLOOD, ED ([facility], [facility], [facility] ONLY)  CBG MONITORING, ED    EKG EKG Interpretation  Date/Time:  Sunday January 02 2021 18:24:55 EST Ventricular Rate:  119 PR Interval:  158 QRS Duration: 72 QT Interval:  315 QTC Calculation: 444 R Axis:   83 Text Interpretation: Sinus tachycardia Right atrial enlargement Consider left ventricular hypertrophy Anterior Q waves, possibly due to LVH Confirmed by Pickering, Nathan ([id number]) on 01/02/2021 6:42:36  PM  Radiology DG Chest Portable 1 View  Result Date: 01/02/2021 CLINICAL DATA:  Short of breath chest wall pain EXAM: PORTABLE CHEST 1 VIEW COMPARISON:  None. FINDINGS: Midline sternotomy overlies stable cardiac silhouette. Lungs hyperinflated. No effusion, infiltrate or pneumothorax. IMPRESSION: Hyperinflated lungs.  No acute findings. Electronically Signed   By: Stewart  Edmunds M.D.   On: 01/02/2021 20:15    Procedures Procedures   Medications Ordered in ED Medications  potassium chloride 10 mEq in 100 mL IVPB (10 mEq Intravenous New Bag/Given 01/02/21 2110)  potassium chloride SA (KLOR-CON M) CR tablet 20 mEq (20 mEq Oral Given 01/02/21 2112)  0.9 %  sodium chloride infusion ( Intravenous New Bag/Given 01/02/21 2127)  insulin regular, human (MYXREDLIN) 100 units/ 100 mL infusion (has no administration in time range)  lactated ringers infusion (has no administration in time range)  dextrose 5 % in lactated ringers infusion (has no administration in time range)  dextrose 50 % solution 0-50 mL (has no administration in time range)  sodium chloride 0.9 % bolus 1,000 mL (0 mLs Intravenous Stopped 01/02/21 2056)  morphine 4 MG/ML injection 4 mg (4 mg Intravenous Given 01/02/21 1904)  metoCLOPramide (REGLAN) injection 10 mg (10 mg Intravenous Given 01/02/21 1904)  aspirin chewable tablet 324 mg (324 mg Oral Given 01/02/21 1931)    ED Course  I have reviewed the triage vital signs and the nursing notes.  Pertinent labs & imaging results that were available during my care of the patient were reviewed by me and considered in my medical decision making (see chart for details).    MDM Rules/Calculators/A&P                          49  year old female type I diabetic controlled with insulin pump.  She has a significant cardiac history including prior CABG.  She is presenting with emesis, hyperglycemia, chest pain as above.  Tachypneic and tachycardic on arrival.  Somnolent appearing, but  awakens to voice and fully oriented.  CBG 200s. EKG showed questionable flattening of the anterior ST segments.  Troponin mildly elevated to 33.  Delta 25.  Low concern for ACS.  VBG showed pH of 7.5 and PCO2 19 consistent with respiratory alkalosis.  Her COVID and flu are negative.  Her beta hydroxybutyrate is elevated.  Bicarb is reduced at 13.  Anion gap elevated at 16.  Presentation is concerning for DKA.  Patient given IV fluid bolus and antiemetics.  Supplemented potassium.  She was started on maintenance fluids and insulin GGT.  Chest x-ray showed no acute infiltrate.  UA showed no signs of acute infection.  Medicine team contacted for admission.  Handoff given.  Final Clinical Impression(s) / ED Diagnoses Final diagnoses:  Diabetic ketoacidosis without coma associated with type 1 diabetes mellitus [facility])    Rx / DC Orders ED Discharge Orders     None        Idamae Lusher, MD 01/02/21 2158    Davonna Belling, MD 01/04/21 [phone]

## 2021-01-02 NOTE — ED Notes (Signed)
Dr.Pickering made aware of VBG results. ED-Lab.

## 2021-01-03 ENCOUNTER — Encounter: Payer: Self-pay | Admitting: Nurse Practitioner

## 2021-01-03 LAB — BASIC METABOLIC PANEL
Anion gap: 9 (ref 5–15)
Anion gap: 9 (ref 5–15)
Anion gap: 8 (ref 5–15)
BUN: 27 mg/dL — ABNORMAL HIGH (ref 6–20)
BUN: 21 mg/dL — ABNORMAL HIGH (ref 6–20)
BUN: 18 mg/dL (ref 6–20)
CO2: 19 mmol/L — ABNORMAL LOW (ref 22–32)
CO2: 19 mmol/L — ABNORMAL LOW (ref 22–32)
CO2: 20 mmol/L — ABNORMAL LOW (ref 22–32)
Calcium: 8.5 mg/dL — ABNORMAL LOW (ref 8.9–10.3)
Calcium: 8.6 mg/dL — ABNORMAL LOW (ref 8.9–10.3)
Calcium: 8.7 mg/dL — ABNORMAL LOW (ref 8.9–10.3)
Chloride: 101 mmol/L (ref 98–111)
Chloride: 105 mmol/L (ref 98–111)
Chloride: 106 mmol/L (ref 98–111)
Creatinine, Ser: 1.09 mg/dL — ABNORMAL HIGH (ref 0.44–1.00)
Creatinine, Ser: 0.89 mg/dL (ref 0.44–1.00)
Creatinine, Ser: 0.91 mg/dL (ref 0.44–1.00)
GFR, Estimated: >60 mL/min (ref >60)
GFR, Estimated: >60 mL/min (ref >60)
GFR, Estimated: >60 mL/min (ref >60)
Glucose, Bld: 154 mg/dL — ABNORMAL HIGH (ref 70–99)
Glucose, Bld: 108 mg/dL — ABNORMAL HIGH (ref 70–99)
Glucose, Bld: 177 mg/dL — ABNORMAL HIGH (ref 70–99)
Potassium: 5.6 mmol/L — ABNORMAL HIGH (ref 3.5–5.1)
Potassium: 3.7 mmol/L (ref 3.5–5.1)
Potassium: 3.9 mmol/L (ref 3.5–5.1)
Sodium: 129 mmol/L — ABNORMAL LOW (ref 135–145)
Sodium: 133 mmol/L — ABNORMAL LOW (ref 135–145)
Sodium: 134 mmol/L — ABNORMAL LOW (ref 135–145)

## 2021-01-03 LAB — CBG MONITORING, ED
Glucose-Capillary: 135 mg/dL — ABNORMAL HIGH (ref 70–99)
Glucose-Capillary: 145 mg/dL — ABNORMAL HIGH (ref 70–99)
Glucose-Capillary: 152 mg/dL — ABNORMAL HIGH (ref 70–99)
Glucose-Capillary: 160 mg/dL — ABNORMAL HIGH (ref 70–99)
Glucose-Capillary: 169 mg/dL — ABNORMAL HIGH (ref 70–99)
Glucose-Capillary: 170 mg/dL — ABNORMAL HIGH (ref 70–99)

## 2021-01-03 LAB — BETA-HYDROXYBUTYRIC ACID: Beta-Hydroxybutyric Acid: 0.5 mmol/L — ABNORMAL HIGH (ref 0.05–0.27)

## 2021-01-03 MED ORDER — INSULIN GLARGINE-YFGN 100 UNIT/ML ~~LOC~~ SOLN
15.0000 [IU] | Freq: Every day | SUBCUTANEOUS | Status: DC
  Administered 2021-01-03: 15 [IU] via SUBCUTANEOUS
  Filled 2021-01-03: qty 0.15

## 2021-01-03 NOTE — Progress Notes (Signed)
Inpatient Diabetes Program Recommendations  AACE/ADA: New Consensus Statement on Inpatient Glycemic Control (2015)  Target Ranges:  Prepandial:   less than 140 mg/dL      Peak postprandial:   less than 180 mg/dL (1-2 hours)      Critically ill patients:  140 - 180 mg/dL   Lab Results  Component Value Date   GLUCAP 145 (H) 01/03/2021   HGBA1C 8.5 (A) 10/11/2020    Review of Glycemic Control  Diabetes history: DM1 (requires basal, meal coverage, + correction) Outpatient Diabetes medications: Insulin pump basal rate of 0.85 units/hr, 6 AM-10 AM, 0.975 units/hr, 10 AM-10 PM, 0.75 units/hr 10PM-12MN, and 0.625 overnight.  bolus of 1 unit/16 grams carbohydrate.  correction bolus (which some people call "sensitivity," or "insulin sensitivity ratio," or just "isr") of 1 unit for each 100 by which your glucose exceeds 100.  Total basal = 14.15 units Current orders for Inpatient glycemic control: Semglee 15  Inpatient Diabetes Program Recommendations:   Patient sees Dr. Loanne Drilling for endocrinology and last appt was a video session 12/13/20 and works with CDE Leonia Reader. Will follow during hospitalization.  Thank you, Nani Gasser. Neeva Trew, RN, MSN, CDE  Diabetes Coordinator Inpatient Glycemic Control Team Team Pager [phone] (8am-5pm) 01/03/2021 10:45 AM

## 2021-01-03 NOTE — ED Notes (Signed)
Per [facility], transition off of insulin drip is recommended. MD aware. Per MD, insulin drip and D5 in LR can be stopped 2 hours after insulin semglee is given.

## 2021-01-03 NOTE — Discharge Summary (Signed)
Physician Discharge Summary  KANG ISHIDA BVQ:[id number] DOB: [birth year] DOA: 01/02/2021  PCP: [name], NP  Admit date: 01/02/2021 Discharge date: 01/03/2021  Admitted From: Home Disposition: Home  Recommendations for Outpatient Follow-up:  Follow up with PCP in 1-2 weeks Schedule follow-up with your endocrinology  Home Health: N/A Equipment/Devices: N/A  Discharge Condition: Stable CODE STATUS: Full code Diet recommendation: Low-carb diet  Discharge summary: History of type 1 diabetes on insulin pump, ischemic cardiomyopathy, chronic systolic heart failure and hyperlipidemia presented with about 2 to 3 days of hyperglycemia, excessive stressors at work, started having nausea and vomiting so came to ER.  In the emergency room bicarb 13, beta hydroxybutyrate 2.68, anion gap 16 with blood sugars of 168.  Patient presented with nausea vomiting so she was treated with IV fluids and IV insulin with improvement of all symptoms.  Electrolytes normalized.  Transition to subcu insulin.  Currently able to eat regular diet and without any nausea vomiting.  Anion gap closed.  Electrolytes are adequate.  She does have some leukocytosis on presentation but no evidence of bacterial infection.  Plan: Patient received 15 units of long-acting insulin today morning after resolution of symptoms.  Patient is willing to go home, she has an insulin pump.  She is very well educated and titrates her insulin at home herself along with the help of her endocrinologist.  Resume all long-term home medications.  Insulin pump to resume today after reaching home with very close monitoring of blood sugars.   Discharge Diagnoses:  Principal Problem:   DKA, type 1 ([place]) Active Problems:   Hypothyroidism   Type 1 diabetes mellitus ([place])   Pure hypercholesterolemia   Chronic systolic heart failure ([place])   AKI (acute kidney injury) [facility])    Discharge Instructions  Discharge Instructions     Call MD  for:  persistant nausea and vomiting   Complete by: As directed    Diet Carb Modified   Complete by: As directed    Discharge instructions   Complete by: As directed    You can resume your insulin pump today. You have received 15 units of long acting insulin today morning at 6 AM.  Closely monitor your blood sugars and discussed with your endocrinology.   Increase activity slowly   Complete by: As directed       Allergies as of 01/03/2021       Reactions   Atorvastatin Other (See Comments)   Myalgias on 80mg  dose, tolerates 20mg  ok   Crestor [rosuvastatin Calcium] Other (See Comments)   Severe myalgias and joint pain. Has tolerated atorvastatin though   Monosodium Glutamate Nausea And Vomiting, Other (See Comments)   migraine   Peanut-containing Drug Products Other (See Comments)   Vomiting, upset stomach and some wheezing   Zetia [ezetimibe] Other (See Comments)   myalgias   Ciprofloxin Hcl [ciprofloxacin] Nausea And Vomiting, Other (See Comments)   Severe migraine   Food Color Red [red Dye] Diarrhea        Medication List     TAKE these medications    acetaminophen 325 MG tablet Commonly known as: TYLENOL Take 650 mg by mouth daily as needed for mild pain, fever or headache.   aspirin 81 MG EC tablet Take 1 tablet (81 mg total) by mouth daily.   atorvastatin 20 MG tablet Commonly known as: LIPITOR Take 1 tablet (20 mg total) by mouth daily. What changed: when to take this   b complex vitamins tablet Take 1  tablet by mouth daily.   budesonide 0.25 MG/2ML nebulizer solution Commonly known as: PULMICORT Take 0.25 mg by nebulization as needed (shortness of breath/wheezing).   clopidogrel 75 MG tablet Commonly known as: PLAVIX TAKE 1 TABLET BY MOUTH DAILY WITH BREAKFAST. What changed:  how much to take how to take this when to take this   diclofenac Sodium 1 % Gel Commonly known as: VOLTAREN Apply 2-4 g topically as needed (knee pain).   FLUoxetine 40  MG capsule Commonly known as: PROZAC TAKE 1 CAPSULE BY MOUTH DAILY. What changed:  how much to take when to take this   fluticasone 50 MCG/ACT nasal spray Commonly known as: FLONASE Place 1 spray into both nostrils at bedtime as needed for allergies.   furosemide 20 MG tablet Commonly known as: LASIX Take 1 tablet (20 mg total) by mouth daily as needed (for weight gain).   HumaLOG 100 UNIT/ML injection Generic drug: insulin lispro Inject 35 units into the skin daily via pump   hydrOXYzine 25 MG tablet Commonly known as: ATARAX Take 1 tablet (25 mg total) by mouth 3 (three) times daily as needed. What changed: reasons to take this   insulin pump Soln Inject into the skin continuous. Humalog   ipratropium 0.06 % nasal spray Commonly known as: ATROVENT Place 2 sprays into both nostrils 2 (two) times daily as needed for rhinitis.   levothyroxine 100 MCG tablet Commonly known as: SYNTHROID TAKE 1 TABLET (100 MCG TOTAL) BY MOUTH DAILY BEFORE BREAKFAST. What changed:  how much to take how to take this when to take this   metoCLOPramide 5 MG tablet Commonly known as: Reglan Take 1 tablet (5 mg total) by mouth every 12 (twelve) hours as needed for nausea or vomiting.   metoprolol succinate 25 MG 24 hr tablet Commonly known as: TOPROL-XL Take 0.5 tablets (12.5 mg total) by mouth daily.   montelukast 10 MG tablet Commonly known as: SINGULAIR TAKE 1 TABLET (10 MG TOTAL) BY MOUTH AT BEDTIME. What changed: how much to take   nitroGLYCERIN 0.4 MG SL tablet Commonly known as: NITROSTAT Place 1 tablet (0.4 mg total) under the tongue every 5 (five) minutes as needed for chest pain.   ondansetron 4 MG disintegrating tablet Commonly known as: ZOFRAN-ODT TAKE 1 TABLET (4 MG TOTAL) BY MOUTH EVERY 8 (EIGHT) HOURS AS NEEDED FOR NAUSEA OR VOMITING. What changed:  how much to take reasons to take this   pantoprazole 40 MG tablet Commonly known as: PROTONIX Take 1 tablet (40 mg  total) by mouth 2 (two) times daily as needed (acid reflux).   ProAir HFA 108 (90 Base) MCG/ACT inhaler Generic drug: albuterol INHALE 2 PUFFS INTO THE LUNGS EVERY 6 (SIX) HOURS AS NEEDED FOR WHEEZING OR SHORTNESS OF BREATH.        Allergies  Allergen Reactions   Atorvastatin Other (See Comments)    Myalgias on 80mg  dose, tolerates 20mg  ok   Crestor [Rosuvastatin Calcium] Other (See Comments)    Severe myalgias and joint pain. Has tolerated atorvastatin though   Monosodium Glutamate Nausea And Vomiting and Other (See Comments)    migraine   Peanut-Containing Drug Products Other (See Comments)    Vomiting, upset stomach and some wheezing   Zetia [Ezetimibe] Other (See Comments)    myalgias   Ciprofloxin Hcl [Ciprofloxacin] Nausea And Vomiting and Other (See Comments)    Severe migraine   Food Color Red [Red Dye] Diarrhea    Consultations: None   Procedures/Studies: DG Chest  Portable 1 View  Result Date: 01/02/2021 CLINICAL DATA:  Short of breath chest wall pain EXAM: PORTABLE CHEST 1 VIEW COMPARISON:  None. FINDINGS: Midline sternotomy overlies stable cardiac silhouette. Lungs hyperinflated. No effusion, infiltrate or pneumothorax. IMPRESSION: Hyperinflated lungs.  No acute findings. Electronically Signed   By: Suzy Bouchard M.D.   On: 01/02/2021 20:15   ECHOCARDIOGRAM COMPLETE  Result Date: 12/27/2020    ECHOCARDIOGRAM REPORT   Patient Name:   BHAVYA ESCHETE Date of Exam: 12/27/2020 Medical Rec #:  [id number]         Height:       65.0 in Accession #:    [id number]        Weight:       148.0 lb Date of Birth:  09/11/1970        BSA:          1.741 m Patient Age:    17 years          BP:           140/74 mmHg Patient Gender: F                 HR:           66 bpm. Exam Location:  Outpatient Procedure: 2D Echo Indications:    Congestive Heart Failure  History:        Patient has prior history of Echocardiogram examinations, most                 recent 10/02/2020. CAD, Prior  CABG; Risk Factors:Diabetes.  Sonographer:    Mikki Santee RDCS Referring Phys: [id number] AMY D CLEGG IMPRESSIONS  1. Left ventricular ejection fraction, by estimation, is 40 to 45%. The left ventricle has mildly decreased function. The left ventricle has no regional wall motion abnormalities. Left ventricular diastolic parameters were normal. There is hypokinesis of the left ventricular, entire anteroseptal wall.  2. Right ventricular systolic function is mildly reduced. The right ventricular size is normal. There is normal pulmonary artery systolic pressure.  3. Left atrial size was moderately dilated.  4. The mitral valve is normal in structure. Trivial mitral valve regurgitation. No evidence of mitral stenosis.  5. The aortic valve is normal in structure. Aortic valve regurgitation is not visualized. No aortic stenosis is present.  6. The inferior vena cava is normal in size with greater than 50% respiratory variability, suggesting right atrial pressure of 3 mmHg. FINDINGS  Left Ventricle: Left ventricular ejection fraction, by estimation, is 40 to 45%. The left ventricle has mildly decreased function. The left ventricle has no regional wall motion abnormalities. The left ventricular internal cavity size was normal in size. There is no left ventricular hypertrophy. Left ventricular diastolic parameters were normal. Right Ventricle: The right ventricular size is normal. No increase in right ventricular wall thickness. Right ventricular systolic function is mildly reduced. There is normal pulmonary artery systolic pressure. The tricuspid regurgitant velocity is 2.61 m/s, and with an assumed right atrial pressure of 3 mmHg, the estimated right ventricular systolic pressure is 29.5 mmHg. Left Atrium: Left atrial size was moderately dilated. Right Atrium: Right atrial size was normal in size. Pericardium: There is no evidence of pericardial effusion. Mitral Valve: The mitral valve is normal in structure. Trivial  mitral valve regurgitation. No evidence of mitral valve stenosis. Tricuspid Valve: The tricuspid valve is normal in structure. Tricuspid valve regurgitation is mild . No evidence of tricuspid stenosis. Aortic Valve: The aortic valve is normal in structure.  Aortic valve regurgitation is not visualized. No aortic stenosis is present. Pulmonic Valve: The pulmonic valve was normal in structure. Pulmonic valve regurgitation is not visualized. No evidence of pulmonic stenosis. Aorta: The aortic root is normal in size and structure. Venous: The inferior vena cava is normal in size with greater than 50% respiratory variability, suggesting right atrial pressure of 3 mmHg. IAS/Shunts: No atrial level shunt detected by color flow Doppler.  LEFT VENTRICLE PLAX 2D LVIDd:         4.90 cm      Diastology LVIDs:         3.50 cm      LV e' medial:    7.73 cm/s LV PW:         0.80 cm      LV E/e' medial:  16.6 LV IVS:        0.80 cm      LV e' lateral:   9.49 cm/s LVOT diam:     2.00 cm      LV E/e' lateral: 13.5 LV SV:         55 LV SV Index:   32 LVOT Area:     3.14 cm  LV Volumes (MOD) LV vol d, MOD A2C: 123.0 ml LV vol d, MOD A4C: 80.5 ml LV vol s, MOD A2C: 64.0 ml LV vol s, MOD A4C: 45.8 ml LV SV MOD A2C:     59.0 ml LV SV MOD A4C:     80.5 ml LV SV MOD BP:      45.8 ml RIGHT VENTRICLE RV S prime:     9.16 cm/s TAPSE (M-mode): 1.6 cm LEFT ATRIUM             Index        RIGHT ATRIUM           Index LA diam:        3.20 cm 1.84 cm/m   RA Area:     15.90 cm LA Vol (A2C):   65.5 ml 37.63 ml/m  RA Volume:   37.30 ml  21.43 ml/m LA Vol (A4C):   48.0 ml 27.58 ml/m LA Biplane Vol: 57.7 ml 33.15 ml/m  AORTIC VALVE LVOT Vmax:   71.40 cm/s LVOT Vmean:  48.800 cm/s LVOT VTI:    0.176 m  AORTA Ao Root diam: 2.50 cm Ao Asc diam:  3.10 cm MITRAL VALVE                TRICUSPID VALVE MV Area (PHT): 4.06 cm     TR Peak grad:   27.2 mmHg MV Decel Time: 187 msec     TR Vmax:        261.00 cm/s MV E velocity: 128.00 cm/s MV A velocity:  49.90 cm/s   SHUNTS MV E/A ratio:  2.57         Systemic VTI:  0.18 m                             Systemic Diam: 2.00 cm Glori Bickers MD Electronically signed by Glori Bickers MD Signature Date/Time: 12/27/2020/2:12:52 PM    Final    (Echo, Carotid, EGD, Colonoscopy, ERCP)    Subjective: Patient seen and examined in the emergency room.  She wanted to try some snacks and go home.  Blood sugars better.  Nausea vomiting is better now. Going through lot of stress due to end of semester.  Discharge Exam: Vitals:   01/03/21 1000 01/03/21 1100  BP: (!) 92/58 (!) 104/59  Pulse: 73 63  Resp: 19 18  Temp:    SpO2: 99% 99%   Vitals:   01/03/21 0800 01/03/21 0900 01/03/21 1000 01/03/21 1100  BP: 99/60 (!) 96/55 (!) 92/58 (!) 104/59  Pulse: 64 82 73 63  Resp: 10 14 19 18   Temp:      TempSrc:      SpO2: 98% 100% 99% 99%  Weight:      Height:        General: Pt is alert, awake, not in acute distress Cardiovascular: RRR, S1/S2 +, no rubs, no gallops Respiratory: CTA bilaterally, no wheezing, no rhonchi Abdominal: Soft, NT, ND, bowel sounds + Extremities: no edema, no cyanosis    The results of significant diagnostics from this hospitalization (including imaging, microbiology, ancillary and laboratory) are listed below for reference.     Microbiology: Recent Results (from the past 240 hour(s))  Resp Panel by RT-PCR (Flu A&B, Covid) Nasopharyngeal Swab     Status: None   Collection Time: 01/02/21  7:06 PM   Specimen: Nasopharyngeal Swab; Nasopharyngeal(NP) swabs in vial transport medium  Result Value Ref Range Status   SARS Coronavirus 2 by RT PCR NEGATIVE NEGATIVE Final    Comment: (NOTE) SARS-CoV-2 target nucleic acids are NOT DETECTED.  The SARS-CoV-2 RNA is generally detectable in upper respiratory specimens during the acute phase of infection. The lowest concentration of SARS-CoV-2 viral copies this assay can detect is 138 copies/mL. A negative result does not preclude  SARS-Cov-2 infection and should not be used as the sole basis for treatment or other patient management decisions. A negative result may occur with  improper specimen collection/handling, submission of specimen other than nasopharyngeal swab, presence of viral mutation(s) within the areas targeted by this assay, and inadequate number of viral copies(<138 copies/mL). A negative result must be combined with clinical observations, patient history, and epidemiological information. The expected result is Negative.  Fact Sheet for Patients:  EntrepreneurPulse.com.au  Fact Sheet for Healthcare Providers:  IncredibleEmployment.be  This test is no t yet approved or cleared by the [place] FDA and  has been authorized for detection and/or diagnosis of SARS-CoV-2 by FDA under an Emergency Use Authorization (EUA). This EUA will remain  in effect (meaning this test can be used) for the duration of the COVID-19 declaration under Section 564(b)(1) of the Act, 21 U.S.C.section 360bbb-3(b)(1), unless the authorization is terminated  or revoked sooner.       Influenza A by PCR NEGATIVE NEGATIVE Final   Influenza B by PCR NEGATIVE NEGATIVE Final    Comment: (NOTE) The Xpert Xpress SARS-CoV-2/FLU/RSV plus assay is intended as an aid in the diagnosis of influenza from Nasopharyngeal swab specimens and should not be used as a sole basis for treatment. Nasal washings and aspirates are unacceptable for Xpert Xpress SARS-CoV-2/FLU/RSV testing.  Fact Sheet for Patients: EntrepreneurPulse.com.au  Fact Sheet for Healthcare Providers: IncredibleEmployment.be  This test is not yet approved or cleared by the [place] FDA and has been authorized for detection and/or diagnosis of SARS-CoV-2 by FDA under an Emergency Use Authorization (EUA). This EUA will remain in effect (meaning this test can be used) for the duration of  the COVID-19 declaration under Section 564(b)(1) of the Act, 21 U.S.C. section 360bbb-3(b)(1), unless the authorization is terminated or revoked.  Performed at [facility], [place] [address] [place], [place] [address]      Labs:  BNP (last 3 results) Recent Labs    11/29/20 1135  BNP 29.9   Basic Metabolic Panel: Recent Labs  Lab 12/27/20 1454 01/02/21 1840 01/02/21 1926 01/03/21 0200 01/03/21 0517 01/03/21 0930  NA 137 130* 132* 129* 133* 134*  K 4.4 3.3* 3.3* 5.6* 3.7 3.9  CL 105 101  --  101 105 106  CO2 27 13*  --  19* 19* 20*  GLUCOSE 95 159*  --  154* 108* 177*  BUN 11 33*  --  27* 21* 18  CREATININE 0.80 1.29*  --  1.09* 0.89 0.91  CALCIUM 9.0 9.8  --  8.5* 8.6* 8.7*   Liver Function Tests: Recent Labs  Lab 12/27/20 1454 01/02/21 1906  AST 25 45*  ALT 16 30  ALKPHOS 79 88  BILITOT 0.3 1.0  PROT 7.2 8.0  ALBUMIN 3.8 4.2   No results for input(s): LIPASE, AMYLASE in the last 168 hours. No results for input(s): AMMONIA in the last 168 hours. CBC: Recent Labs  Lab 01/02/21 1840 01/02/21 1926  WBC 15.4*  --   HGB 14.0 13.6  HCT 40.5 40.0  MCV 87.3  --   PLT 527*  --    Cardiac Enzymes: No results for input(s): CKTOTAL, CKMB, CKMBINDEX, TROPONINI in the last 168 hours. BNP: Invalid input(s): POCBNP CBG: Recent Labs  Lab 01/03/21 0140 01/03/21 0243 01/03/21 0344 01/03/21 0541 01/03/21 0819  GLUCAP 160* 169* 170* 152* 145*   D-Dimer No results for input(s): DDIMER in the last 72 hours. Hgb A1c No results for input(s): HGBA1C in the last 72 hours. Lipid Profile No results for input(s): CHOL, HDL, LDLCALC, TRIG, CHOLHDL, LDLDIRECT in the last 72 hours. Thyroid function studies No results for input(s): TSH, T4TOTAL, T3FREE, THYROIDAB in the last 72 hours.  Invalid input(s): FREET3 Anemia work up No results for input(s): VITAMINB12, FOLATE, FERRITIN, TIBC, IRON, RETICCTPCT in the last 72 hours. Urinalysis    Component Value  Date/Time   COLORURINE YELLOW 01/02/2021 1840   APPEARANCEUR CLOUDY (A) 01/02/2021 1840   LABSPEC 1.020 01/02/2021 1840   PHURINE 5.0 01/02/2021 1840   GLUCOSEU 50 (A) 01/02/2021 1840   GLUCOSEU NEGATIVE 11/23/2015 0831   HGBUR SMALL (A) 01/02/2021 1840   BILIRUBINUR NEGATIVE 01/02/2021 1840   BILIRUBINUR neg 12/07/2015 0843   KETONESUR 20 (A) 01/02/2021 1840   PROTEINUR 100 (A) 01/02/2021 1840   UROBILINOGEN 1.0 12/07/2015 0843   UROBILINOGEN 0.2 11/23/2015 0831   NITRITE NEGATIVE 01/02/2021 1840   LEUKOCYTESUR NEGATIVE 01/02/2021 1840   Sepsis Labs Invalid input(s): PROCALCITONIN,  WBC,  LACTICIDVEN Microbiology Recent Results (from the past 240 hour(s))  Resp Panel by RT-PCR (Flu A&B, Covid) Nasopharyngeal Swab     Status: None   Collection Time: 01/02/21  7:06 PM   Specimen: Nasopharyngeal Swab; Nasopharyngeal(NP) swabs in vial transport medium  Result Value Ref Range Status   SARS Coronavirus 2 by RT PCR NEGATIVE NEGATIVE Final    Comment: (NOTE) SARS-CoV-2 target nucleic acids are NOT DETECTED.  The SARS-CoV-2 RNA is generally detectable in upper respiratory specimens during the acute phase of infection. The lowest concentration of SARS-CoV-2 viral copies this assay can detect is 138 copies/mL. A negative result does not preclude SARS-Cov-2 infection and should not be used as the sole basis for treatment or other patient management decisions. A negative result may occur with  improper specimen collection/handling, submission of specimen other than nasopharyngeal swab, presence of viral mutation(s) within the areas targeted by this assay, and inadequate  number of viral copies(<138 copies/mL). A negative result must be combined with clinical observations, patient history, and epidemiological information. The expected result is Negative.  Fact Sheet for Patients:  EntrepreneurPulse.com.au  Fact Sheet for Healthcare Providers:   IncredibleEmployment.be  This test is no t yet approved or cleared by the [place] FDA and  has been authorized for detection and/or diagnosis of SARS-CoV-2 by FDA under an Emergency Use Authorization (EUA). This EUA will remain  in effect (meaning this test can be used) for the duration of the COVID-19 declaration under Section 564(b)(1) of the Act, 21 U.S.C.section 360bbb-3(b)(1), unless the authorization is terminated  or revoked sooner.       Influenza A by PCR NEGATIVE NEGATIVE Final   Influenza B by PCR NEGATIVE NEGATIVE Final    Comment: (NOTE) The Xpert Xpress SARS-CoV-2/FLU/RSV plus assay is intended as an aid in the diagnosis of influenza from Nasopharyngeal swab specimens and should not be used as a sole basis for treatment. Nasal washings and aspirates are unacceptable for Xpert Xpress SARS-CoV-2/FLU/RSV testing.  Fact Sheet for Patients: EntrepreneurPulse.com.au  Fact Sheet for Healthcare Providers: IncredibleEmployment.be  This test is not yet approved or cleared by the [place] FDA and has been authorized for detection and/or diagnosis of SARS-CoV-2 by FDA under an Emergency Use Authorization (EUA). This EUA will remain in effect (meaning this test can be used) for the duration of the COVID-19 declaration under Section 564(b)(1) of the Act, 21 U.S.C. section 360bbb-3(b)(1), unless the authorization is terminated or revoked.  Performed at [facility], [place] [address], [place], [place] [address]      Time coordinating discharge:  30 minutes  SIGNED:   Barb Merino, MD  [facility] 01/03/2021, 12:14 PM

## 2021-01-03 NOTE — ED Notes (Signed)
Admitting provider informed this RN via private chat that pt was to be D/C following instructions that was given to her. All D/C instructions reviewed with pt by this RN, both IV's removed, monitoring equipment removed, pt dressed & was pushed in w/c to waiting room while waiting for her ride.

## 2021-01-03 NOTE — ED Notes (Signed)
Pt was given club house crackers & cheddar cheese.

## 2021-01-06 ENCOUNTER — Other Ambulatory Visit: Payer: Self-pay

## 2021-01-13 ENCOUNTER — Other Ambulatory Visit: Payer: Self-pay | Admitting: Physician Assistant

## 2021-01-13 ENCOUNTER — Other Ambulatory Visit: Payer: Self-pay

## 2021-01-13 ENCOUNTER — Other Ambulatory Visit: Payer: Self-pay | Admitting: Nurse Practitioner

## 2021-01-13 DIAGNOSIS — J9801 Acute bronchospasm: Secondary | ICD-10-CM

## 2021-01-13 DIAGNOSIS — K3184 Gastroparesis: Secondary | ICD-10-CM

## 2021-01-13 MED ORDER — METOCLOPRAMIDE HCL 5 MG PO TABS
5.0000 mg | ORAL_TABLET | Freq: Two times a day (BID) | ORAL | 0 refills | Status: DC | PRN
  Filled 2021-01-13: qty 60, 30d supply, fill #0

## 2021-01-13 MED ORDER — MONTELUKAST SODIUM 10 MG PO TABS
10.0000 mg | ORAL_TABLET | Freq: Every day | ORAL | 4 refills | Status: DC
  Filled 2021-01-13 – 2021-02-14 (×2): qty 30, 30d supply, fill #0
  Filled 2021-04-02: qty 30, 30d supply, fill #1
  Filled 2021-05-23 – 2021-05-25 (×2): qty 30, 30d supply, fill #2
  Filled 2021-06-23: qty 30, 30d supply, fill #3

## 2021-01-13 NOTE — Telephone Encounter (Signed)
Refill not delegated. °

## 2021-01-14 ENCOUNTER — Telehealth: Payer: Medicaid Other | Admitting: Emergency Medicine

## 2021-01-14 ENCOUNTER — Other Ambulatory Visit (HOSPITAL_COMMUNITY): Payer: Self-pay

## 2021-01-14 ENCOUNTER — Other Ambulatory Visit: Payer: Self-pay

## 2021-01-14 NOTE — Progress Notes (Signed)
Pt reports sinus infection symptoms AND an urgent need for insulin refill. I sent her a message suggesting she convert to a virtual urgent care visit.

## 2021-01-18 ENCOUNTER — Other Ambulatory Visit: Payer: Self-pay

## 2021-01-24 ENCOUNTER — Other Ambulatory Visit: Payer: Self-pay

## 2021-01-25 ENCOUNTER — Other Ambulatory Visit: Payer: Self-pay

## 2021-01-25 ENCOUNTER — Other Ambulatory Visit: Payer: Self-pay | Admitting: Nurse Practitioner

## 2021-01-28 ENCOUNTER — Telehealth (HOSPITAL_COMMUNITY): Payer: Self-pay | Admitting: Licensed Clinical Social Worker

## 2021-01-28 NOTE — Telephone Encounter (Signed)
Pt left message for CSW informing that she had lost her job and was trying to figure out next steps and get update regarding [place] case.  CSW called pt back to discuss.  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  Has some savings so should be ok for a short period of time.  Pt states she has an "interval disability case" pending with a lawyer- unsure what this means but per patient could result in her getting disability payments for the span of 8 months.  Encouraged her to call her lawyer for update.  CSW emailed case worker with [facility] for update and to check if there is anything we need to be doing to expedite the case.  No further needs at this time- will plan to follow up with pt next week to check in  Jorge Ny, [facility] Desk#: [phone] Cell#: [phone]

## 2021-02-02 ENCOUNTER — Telehealth (HOSPITAL_COMMUNITY): Payer: Self-pay | Admitting: Licensed Clinical Social Worker

## 2021-02-02 NOTE — Telephone Encounter (Signed)
CSW received message back from [facility] reporting they just received pt signatures so they could file on her behalf so they would be submitting her application this week.  CSW updated pt.  She has been trying to get information about her interval disability case had to leave another message with her lawyer.  CSW encouraged pt to start thinking of how she is going to manage as disability takes 6-9 months to process and per pt she only has enough funds to make it through the end of the month.  Pt will plan to call CSW when more paperwork comes in from [facility] so we can work on it together  Will continue to follow and assist as needed  Jorge Ny, [place] Worker [facility] Desk#: [phone] Cell#: [phone]

## 2021-02-14 ENCOUNTER — Other Ambulatory Visit: Payer: Self-pay | Admitting: Cardiology

## 2021-02-14 ENCOUNTER — Other Ambulatory Visit: Payer: Self-pay

## 2021-02-14 ENCOUNTER — Other Ambulatory Visit: Payer: Self-pay | Admitting: Endocrinology

## 2021-02-14 DIAGNOSIS — E119 Type 2 diabetes mellitus without complications: Secondary | ICD-10-CM

## 2021-02-14 MED ORDER — INSULIN LISPRO 100 UNIT/ML IJ SOLN
INTRAMUSCULAR | 1 refills | Status: DC
  Filled 2021-02-14: qty 10, 28d supply, fill #0
  Filled 2021-03-30: qty 10, 28d supply, fill #1

## 2021-02-15 ENCOUNTER — Other Ambulatory Visit: Payer: Self-pay

## 2021-02-25 ENCOUNTER — Other Ambulatory Visit: Payer: Self-pay

## 2021-03-01 ENCOUNTER — Ambulatory Visit (INDEPENDENT_AMBULATORY_CARE_PROVIDER_SITE_OTHER): Payer: Self-pay | Admitting: Cardiology

## 2021-03-01 ENCOUNTER — Other Ambulatory Visit: Payer: Self-pay

## 2021-03-01 ENCOUNTER — Encounter: Payer: Self-pay | Admitting: Cardiology

## 2021-03-01 VITALS — BP 91/63 | HR 83 | Ht 65.0 in | Wt 143.0 lb

## 2021-03-01 DIAGNOSIS — I2510 Atherosclerotic heart disease of native coronary artery without angina pectoris: Secondary | ICD-10-CM

## 2021-03-01 DIAGNOSIS — E785 Hyperlipidemia, unspecified: Secondary | ICD-10-CM

## 2021-03-01 DIAGNOSIS — I255 Ischemic cardiomyopathy: Secondary | ICD-10-CM

## 2021-03-01 LAB — LIPID PANEL
Chol/HDL Ratio: 3.6 ratio (ref 0.0–4.4)
Cholesterol, Total: 236 mg/dL — ABNORMAL HIGH (ref 100–199)
HDL: 65 mg/dL (ref >39)
LDL Chol Calc (NIH): 155 mg/dL — ABNORMAL HIGH (ref 0–99)
Triglycerides: 90 mg/dL (ref 0–149)
VLDL Cholesterol Cal: 16 mg/dL (ref 5–40)

## 2021-03-01 LAB — ALT: ALT: 14 IU/L (ref 0–32)

## 2021-03-01 NOTE — Patient Instructions (Signed)
Medication Instructions:  Your physician recommends that you continue on your current medications as directed. Please refer to the Current Medication list given to you today.  *If you need a refill on your cardiac medications before your next appointment, please call your pharmacy*   Lab Work: TODAY: FLP and ALT If you have labs (blood work) drawn today and your tests are completely normal, you will receive your results only by: [place] (if you have MyChart) OR A paper copy in the mail If you have any lab test that is abnormal or we need to change your treatment, we will call you to review the results.   Follow-Up: At [facility], you and your health needs are our priority.  As part of our continuing mission to provide you with exceptional heart care, we have created designated Provider Care Teams.  These Care Teams include your primary Cardiologist (physician) and Advanced Practice Providers (APPs -  Physician Assistants and Nurse Practitioners) who all work together to provide you with the care you need, when you need it.   Your next appointment:   1 year(s)  The format for your next appointment:   In Person  Provider:   Fransico Him, MD

## 2021-03-01 NOTE — Progress Notes (Signed)
Date:  03/01/2021   ID:  ELLEAN Hogan, DOB 04/26/1970, MRN [id number]  PCP:  [name], NP  Cardiologist:  Fransico Him, MD  Electrophysiologist:  None   Chief Complaint:  CAD  History of Present Illness:    Jennifer Hogan is a 51 y.o. female  with a hx of  type 1 diabetes mellitus on chronic insulin pump, hypothyroidism and ischemic dilated cardiomyopathy EF 45 to 50% with apical hypokinesis.  She has severe 3 vessel ASCAD s/p NSTEMI with cath showing severe three-vessel CAD status post PTCA DES x3 in the LAD(ostial DES placed, mid DES placed, distal DES placed) and  PTCA/DES x 1 intermediate branch. She was placed on DAPT w/ ASA and Brilinta, along with high dose statin therapy, Lipitor 80 mg, (LDL elevated at 126 mg/dL).  She had issues with orthostatic hypotension, thus no ACE/ARB was added.   In January 2020 presented with STEMI and underwent stenting of ostial to proximal RCA. She had residual moderate disease in the mid LAD between stents and a ostial ramus branch and was treated medically.  She was readmitted 10/29/2018 with N/V and abdominal pain and then developed CP, weakness and lightheadedness.  She presented to ER hypotensive with SBP 96mmHg and responded to IVF.  She ruled in for MI with Trop of 1138 and underwent cath showing significant stenosis in RI and 60% mLAD.  She ultimately underwent CABG with LIMA>LAD and RI, RIMA>OM.    She was admitted 10/01/2020 with DKA and NSTEMI.  This was complicated by acute respiratory failure and pulmonary edema.  Had ECHO with reduced EF down to 20-25%. LHC showed severe CAD with total occlusion of the proximal LAD, moderate stenosis of the intermediate branch, and severe ostial circumflex in-stent restenosis with patency of both the RIMA to left circumflex and LAD sequential to intermediate grafts.. Diuresed with IV lasix. Discharged on Toprol Xl and spironolactone.  She was seen back in heart failure clinic on 12/27/2020 and was doing  well.  She was encouraged to start exercising more.  2D echo on 12/27/2020 showed EF 40 to 45% with mild RV hypokinesis.   Prior CV studies:   The following studies were reviewed today:  none  Past Medical History:  Diagnosis Date   Anemia    Anxiety    Coronary artery disease    a. s/p PTCA DES x3 in the LAD (ostial DES placed, mid DES placed, distal DES placed) and PTCA/DES x1 to intermediate branch 02/2017. b. ACS 02/08/18 with LCx stent placement.   Depression    Diabetes mellitus    type 1, on insulin pump   DKA (diabetic ketoacidoses) 12/31/2017   Elevated platelet count    High cholesterol    Hypothyroidism    Ischemic cardiomyopathy    a. EF low-normal with basal inferior akinesis, basal septal hypokinesis by echo 01/2018.   Past Surgical History:  Procedure Laterality Date   APPENDECTOMY     CORONARY ARTERY BYPASS GRAFT N/A 11/04/2018   Procedure: CORONARY ARTERY BYPASS GRAFTING (CABG), ON PUMP, TIMES THREE, USING LEFT AND RIGHT INTERNAL MAMMARY ARTERIES;  Surgeon: Wonda Olds, MD;  Location: [place];  Service: Open Heart Surgery;  Laterality: N/A;   CORONARY STENT INTERVENTION N/A 03/20/2017   Procedure: DES x 3 LAD, DES OM; Surgeon: Burnell Blanks, MD;  Location: [place] [facility];  Service: Cardiovascular;  Laterality: N/A;   CORONARY/GRAFT ACUTE MI REVASCULARIZATION N/A 02/08/2018   Procedure: Coronary/Graft Acute MI Revascularization;  Surgeon: Belva Crome, MD;  Location: [place] [facility];  Service: Cardiovascular;  Laterality: N/A;   LEFT HEART CATH AND CORONARY ANGIOGRAPHY N/A 03/20/2017   Procedure: LEFT HEART CATH AND CORONARY ANGIOGRAPHY;  Surgeon: Burnell Blanks, MD;  Location: [place] [facility];  Service: Cardiovascular;  Laterality: N/A;   LEFT HEART CATH AND CORONARY ANGIOGRAPHY N/A 02/08/2018   Procedure: LEFT HEART CATH AND CORONARY ANGIOGRAPHY;  Surgeon: Belva Crome, MD;  Location: [place] [facility];  Service: Cardiovascular;   Laterality: N/A;   LEFT HEART CATH AND CORONARY ANGIOGRAPHY N/A 10/29/2018   Procedure: LEFT HEART CATH AND CORONARY ANGIOGRAPHY;  Surgeon: Burnell Blanks, MD;  Location: [place] [facility];  Service: Cardiovascular;  Laterality: N/A;   LEFT HEART CATH AND CORS/GRAFTS ANGIOGRAPHY N/A 10/04/2020   Procedure: LEFT HEART CATH AND CORS/GRAFTS ANGIOGRAPHY;  Surgeon: Sherren Mocha, MD;  Location: [place] [facility];  Service: Cardiovascular;  Laterality: N/A;   TEE WITHOUT CARDIOVERSION N/A 11/04/2018   Procedure: TRANSESOPHAGEAL ECHOCARDIOGRAM (TEE);  Surgeon: Wonda Olds, MD;  Location: [place];  Service: Open Heart Surgery;  Laterality: N/A;     Current Meds  Medication Sig   acetaminophen (TYLENOL) 325 MG tablet Take 650 mg by mouth daily as needed for mild pain, fever or headache.   albuterol (VENTOLIN HFA) 108 (90 Base) MCG/ACT inhaler INHALE 2 PUFFS INTO THE LUNGS EVERY 6 (SIX) HOURS AS NEEDED FOR WHEEZING OR SHORTNESS OF BREATH.   aspirin 81 MG EC tablet Take 1 tablet (81 mg total) by mouth daily.   atorvastatin (LIPITOR) 20 MG tablet Take 1 tablet (20 mg total) by mouth daily. (Patient taking differently: Take 20 mg by mouth at bedtime.)   b complex vitamins tablet Take 1 tablet by mouth daily.   budesonide (PULMICORT) 0.25 MG/2ML nebulizer solution Take 0.25 mg by nebulization as needed (shortness of breath/wheezing).   clopidogrel (PLAVIX) 75 MG tablet TAKE 1 TABLET BY MOUTH DAILY WITH BREAKFAST. (Patient taking differently: Take 75 mg by mouth at bedtime.)   diclofenac Sodium (VOLTAREN) 1 % GEL Apply 2-4 g topically as needed (knee pain).   FLUoxetine (PROZAC) 40 MG capsule TAKE 1 CAPSULE BY MOUTH DAILY. (Patient taking differently: Take 40 mg by mouth at bedtime.)   fluticasone (FLONASE) 50 MCG/ACT nasal spray Place 1 spray into both nostrils at bedtime as needed for allergies.   furosemide (LASIX) 20 MG tablet Take 1 tablet (20 mg total) by mouth daily as needed (for weight  gain).   hydrOXYzine (ATARAX/VISTARIL) 25 MG tablet Take 1 tablet (25 mg total) by mouth 3 (three) times daily as needed. (Patient taking differently: Take 25 mg by mouth 3 (three) times daily as needed for anxiety.)   Insulin Human (INSULIN PUMP) SOLN Inject into the skin continuous. Humalog   insulin lispro (HUMALOG) 100 UNIT/ML injection Inject 35 units into the skin daily via pump   ipratropium (ATROVENT) 0.06 % nasal spray Place 2 sprays into both nostrils 2 (two) times daily as needed for rhinitis.   levothyroxine (SYNTHROID) 100 MCG tablet TAKE 1 TABLET (100 MCG TOTAL) BY MOUTH DAILY BEFORE BREAKFAST. (Patient taking differently: Take 100 mcg by mouth daily before breakfast.)   metoCLOPramide (REGLAN) 5 MG tablet Take 1 tablet (5 mg total) by mouth every 12 (twelve) hours as needed for nausea or vomiting.   metoprolol succinate (TOPROL-XL) 25 MG 24 hr tablet Take 0.5 tablets (12.5 mg total) by mouth daily.   montelukast (SINGULAIR) 10 MG  tablet Take 1 tablet (10 mg total) by mouth at bedtime.   nitroGLYCERIN (NITROSTAT) 0.4 MG SL tablet Place 1 tablet (0.4 mg total) under the tongue every 5 (five) minutes as needed for chest pain.   pantoprazole (PROTONIX) 40 MG tablet Take 1 tablet (40 mg total) by mouth 2 (two) times daily as needed (acid reflux).     Allergies:   Atorvastatin, Crestor [rosuvastatin calcium], Monosodium glutamate, Peanut-containing drug products, Zetia [ezetimibe], Ciprofloxin hcl [ciprofloxacin], and Food color red [red dye]   Social History   Tobacco Use   Smoking status: Never   Smokeless tobacco: Never   Tobacco comments:    Use marijuana  Vaping Use   Vaping Use: Never used  Substance Use Topics   Alcohol use: No    Alcohol/week: 0.0 standard drinks   Drug use: Not Currently    Types: Marijuana    Comment: last use last week, occaisonal marijuana use     Family Hx: The patient's family history includes Asthma in her father; Cancer in her mother;  Congestive Heart Failure in her father; Coronary artery disease in her father.  ROS:   Please see the history of present illness.     All other systems reviewed and are negative.   Labs/Other Tests and Data Reviewed:    Recent Labs: 07/30/2020: TSH 1.150 10/04/2020: Magnesium 1.9 11/29/2020: B Natriuretic Peptide 75.4 01/02/2021: ALT 30; Hemoglobin 13.6; Platelets 527 01/03/2021: BUN 18; Creatinine, Ser 0.91; Potassium 3.9; Sodium 134   Recent Lipid Panel Lab Results  Component Value Date/Time   CHOL 256 (H) 07/30/2020 11:53 AM   TRIG 119 07/30/2020 11:53 AM   HDL 69 07/30/2020 11:53 AM   CHOLHDL 3.7 07/30/2020 11:53 AM   CHOLHDL 3.2 02/08/2018 05:17 AM   LDLCALC 166 (H) 07/30/2020 11:53 AM   LDLDIRECT 109 (H) 12/22/2019 02:08 PM    Wt Readings from Last 3 Encounters:  03/01/21 143 lb (64.9 kg)  01/02/21 148 lb (67.1 kg)  12/27/20 148 lb 9.6 oz (67.4 kg)     Objective:    Vital Signs:  BP 91/63    Pulse 83    Ht 5\' 5"  (1.651 m)    Wt 143 lb (64.9 kg)    SpO2 98%    BMI 23.80 kg/m    CONSTITUTIONAL:  Well nourished, well developed female in no acute distress.  EYES: anicteric MOUTH: oral mucosa is pink RESPIRATORY: Normal respiratory effort, symmetric expansion CARDIOVASCULAR: No peripheral edema SKIN: No rash, lesions or ulcers MUSCULOSKELETAL: no digital cyanosis NEURO: Cranial Nerves II-XII grossly intact, moves all extremities PSYCH: Intact judgement and insight.  A&O x 3, Mood/affect appropriate   ASSESSMENT & PLAN:    ASCAD -s/p NSTEMI with cath showing severe three-vessel CAD status post PTCA DES x3 in the LAD(ostial DES placed, mid DES placed, distal DES placed) and  PTCA/DES x 1 intermediate branch. -s/p CABG 11/2018 -she has not had any anginal symptoms since I saw her last -She will continue prescription drug management with aspirin 81 mg daily, Plavix 75 mg daily, Toprol-XL 12.5 mg daily with as needed refills   Hyperlipidemia -LDL goal <  70 -Continue prescription drug management with atorvastatin 80 mg daily. -I have personally reviewed and interpreted outside labs performed by patient's PCP which showed LDL 166, HDL 69 and triglycerides 119 on 07/30/2020 -I will repeat Bmet and FLP  Chronic HFrEF/ICM -This is followed advanced heart failure clinic -Her EF had been 55 to 60% but recent echo 09/2020 showed  an EF of 20 to 25% -LHC with 09/2020 with severe coronary disease. total occlusion of the proximal LAD, moderate stenosis of the intermediate branch, and severe ostial circumflex in-stent restenosis.  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 -I will discuss with Dr. Kae Heller whether she should continue taking the Toprol given her low BP although she feels fine  Medication Adjustments/Labs and Tests Ordered: Current medicines are reviewed at length with the patient today.  Concerns regarding medicines are outlined above.  Tests Ordered: No orders of the defined types were placed in this encounter.  Medication Changes: No orders of the defined types were placed in this encounter.   Disposition:  Follow up in 6 month(s)  Signed, Fransico Him, MD  03/01/2021 9:13 AM    [facility]

## 2021-03-04 ENCOUNTER — Other Ambulatory Visit: Payer: Self-pay

## 2021-03-07 ENCOUNTER — Other Ambulatory Visit (HOSPITAL_COMMUNITY): Payer: Self-pay | Admitting: Internal Medicine

## 2021-03-07 ENCOUNTER — Other Ambulatory Visit: Payer: Self-pay

## 2021-03-07 MED ORDER — METOPROLOL SUCCINATE ER 25 MG PO TB24
12.5000 mg | ORAL_TABLET | Freq: Every day | ORAL | 6 refills | Status: DC
  Filled 2021-03-07 – 2021-03-16 (×2): qty 15, 30d supply, fill #0
  Filled 2021-04-28: qty 15, 30d supply, fill #1
  Filled 2021-06-23: qty 15, 30d supply, fill #2
  Filled 2021-08-10: qty 15, 30d supply, fill #3
  Filled 2021-09-19: qty 15, 30d supply, fill #4
  Filled 2021-10-26: qty 15, 30d supply, fill #5
  Filled 2021-11-18 – 2021-11-21 (×2): qty 15, 30d supply, fill #6

## 2021-03-14 ENCOUNTER — Other Ambulatory Visit: Payer: Self-pay

## 2021-03-16 ENCOUNTER — Other Ambulatory Visit: Payer: Self-pay

## 2021-03-23 ENCOUNTER — Other Ambulatory Visit: Payer: Self-pay

## 2021-03-25 ENCOUNTER — Ambulatory Visit: Payer: Self-pay | Attending: Nurse Practitioner | Admitting: Nurse Practitioner

## 2021-03-25 ENCOUNTER — Other Ambulatory Visit: Payer: Self-pay

## 2021-03-25 ENCOUNTER — Encounter: Payer: Self-pay | Admitting: Nurse Practitioner

## 2021-03-25 VITALS — BP 95/66 | HR 81 | Resp 18 | Ht 65.0 in | Wt 138.0 lb

## 2021-03-25 DIAGNOSIS — E1059 Type 1 diabetes mellitus with other circulatory complications: Secondary | ICD-10-CM

## 2021-03-25 DIAGNOSIS — Z30014 Encounter for initial prescription of intrauterine contraceptive device: Secondary | ICD-10-CM

## 2021-03-25 DIAGNOSIS — Z23 Encounter for immunization: Secondary | ICD-10-CM

## 2021-03-25 DIAGNOSIS — E1143 Type 2 diabetes mellitus with diabetic autonomic (poly)neuropathy: Secondary | ICD-10-CM

## 2021-03-25 DIAGNOSIS — E039 Hypothyroidism, unspecified: Secondary | ICD-10-CM

## 2021-03-25 DIAGNOSIS — Z1211 Encounter for screening for malignant neoplasm of colon: Secondary | ICD-10-CM

## 2021-03-25 LAB — POCT GLYCOSYLATED HEMOGLOBIN (HGB A1C): Hemoglobin A1C: 8.7 % — AB (ref 4.0–5.6)

## 2021-03-25 LAB — GLUCOSE, POCT (MANUAL RESULT ENTRY): POC Glucose: 305 mg/dl — AB (ref 70–99)

## 2021-03-25 NOTE — Progress Notes (Signed)
Assessment & Plan:  [name] was seen today for diabetes.  Diagnoses and all orders for this visit:  Type 1 diabetes mellitus with other circulatory complication (HCC) -     POCT glycosylated hemoglobin (Hb A1C) -     POCT glucose (manual entry) -     CMP14+EGFR  Need for influenza vaccination -     Flu Vaccine QUAD 87moIM (Fluarix, Fluzone & Alfiuria Quad PF)  Need for pneumococcal vaccination -     Pneumococcal conjugate vaccine 20-valent  Colon cancer screening -     Fecal occult blood, imunochemical -     Ambulatory referral to Gastroenterology  Diabetes mellitus with gastroparesis  Continue reglan as prescribed.   Encounter for initial prescription of intrauterine contraceptive device (IUD) -     Ambulatory referral to Gynecology  Hypothyroidism, unspecified type -     Thyroid Panel With TSH    Patient has been counseled on age-appropriate routine health concerns for screening and prevention. These are reviewed and up-to-date. Referrals have been placed accordingly. Immunizations are up-to-date or declined.    Subjective:   Chief Complaint  Patient presents with   Diabetes   HPI Jennifer LMount Dora89y.o. female presents to office today for follow up to DM.  She has a past medical history of Anemia, Anxiety, Coronary artery disease, Chronic HF with EF 20-25%, DKA, NSTEMI with CABG,  Depression, DKA, DM1 (12/31/2017), Elevated platelet count, gastroparesis, High cholesterol, Hypothyroidism, and Ischemic cardiomyopathy  DM 1 She is also being followed by endocrinology as well as she has an insulin pump. Overdue for appt. She is following recommendations by endocrinology but also makes her own adjustments based on BG readings. Feels she is getting a handle on her gastroparesis and what exacerbates her symptoms. Taking reglan as needed.  Lab Results  Component Value Date   HGBA1C 8.7 (A) 03/25/2021     Contraception In a new relationship with a female. She currently is  not sexually active but wants to discuss non hormonal birth control. She continues to have menstrual cycles although infrequently and likely perimenopausal.  Review of Systems  Constitutional:  Negative for fever, malaise/fatigue and weight loss.  HENT: Negative.  Negative for nosebleeds.   Eyes: Negative.  Negative for blurred vision, double vision and photophobia.  Respiratory: Negative.  Negative for cough and shortness of breath.   Cardiovascular: Negative.  Negative for chest pain, palpitations and leg swelling.  Gastrointestinal: Negative.  Negative for heartburn, nausea and vomiting.  Musculoskeletal: Negative.  Negative for myalgias.  Neurological: Negative.  Negative for dizziness, focal weakness, seizures and headaches.  Psychiatric/Behavioral: Negative.  Negative for suicidal ideas.    Past Medical History:  Diagnosis Date   Anemia    Anxiety    Coronary artery disease    a. s/p PTCA DES x3 in the LAD (ostial DES placed, mid DES placed, distal DES placed) and PTCA/DES x1 to intermediate branch 02/2017. b. ACS 02/08/18 with LCx stent placement.   Depression    Diabetes mellitus    type 1, on insulin pump   DKA (diabetic ketoacidoses) 12/31/2017   Elevated platelet count    High cholesterol    Hypothyroidism    Ischemic cardiomyopathy    a. EF low-normal with basal inferior akinesis, basal septal hypokinesis by echo 01/2018.    Past Surgical History:  Procedure Laterality Date   APPENDECTOMY     CORONARY ARTERY BYPASS GRAFT N/A 11/04/2018   Procedure: CORONARY ARTERY BYPASS GRAFTING (CABG), ON  PUMP, TIMES THREE, USING LEFT AND RIGHT INTERNAL MAMMARY ARTERIES;  Surgeon: Wonda Olds, MD;  Location: [place];  Service: Open Heart Surgery;  Laterality: N/A;   CORONARY STENT INTERVENTION N/A 03/20/2017   Procedure: DES x 3 LAD, DES OM; Surgeon: Burnell Blanks, MD;  Location: [place] [facility];  Service: Cardiovascular;  Laterality: N/A;   CORONARY/GRAFT ACUTE MI  REVASCULARIZATION N/A 02/08/2018   Procedure: Coronary/Graft Acute MI Revascularization;  Surgeon: Belva Crome, MD;  Location: [place] [facility];  Service: Cardiovascular;  Laterality: N/A;   LEFT HEART CATH AND CORONARY ANGIOGRAPHY N/A 03/20/2017   Procedure: LEFT HEART CATH AND CORONARY ANGIOGRAPHY;  Surgeon: Burnell Blanks, MD;  Location: [place] [facility];  Service: Cardiovascular;  Laterality: N/A;   LEFT HEART CATH AND CORONARY ANGIOGRAPHY N/A 02/08/2018   Procedure: LEFT HEART CATH AND CORONARY ANGIOGRAPHY;  Surgeon: Belva Crome, MD;  Location: [place] [facility];  Service: Cardiovascular;  Laterality: N/A;   LEFT HEART CATH AND CORONARY ANGIOGRAPHY N/A 10/29/2018   Procedure: LEFT HEART CATH AND CORONARY ANGIOGRAPHY;  Surgeon: Burnell Blanks, MD;  Location: [place] [facility];  Service: Cardiovascular;  Laterality: N/A;   LEFT HEART CATH AND CORS/GRAFTS ANGIOGRAPHY N/A 10/04/2020   Procedure: LEFT HEART CATH AND CORS/GRAFTS ANGIOGRAPHY;  Surgeon: Sherren Mocha, MD;  Location: [place] [facility];  Service: Cardiovascular;  Laterality: N/A;   TEE WITHOUT CARDIOVERSION N/A 11/04/2018   Procedure: TRANSESOPHAGEAL ECHOCARDIOGRAM (TEE);  Surgeon: Wonda Olds, MD;  Location: [place];  Service: Open Heart Surgery;  Laterality: N/A;    Family History  Problem Relation Age of Onset   Cancer Mother        T cell lymphoma   Coronary artery disease Father    Congestive Heart Failure Father    Asthma Father     Social History Reviewed with no changes to be made today.   Outpatient Medications Prior to Visit  Medication Sig Dispense Refill   acetaminophen (TYLENOL) 325 MG tablet Take 650 mg by mouth daily as needed for mild pain, fever or headache.     albuterol (VENTOLIN HFA) 108 (90 Base) MCG/ACT inhaler INHALE 2 PUFFS INTO THE LUNGS EVERY 6 (SIX) HOURS AS NEEDED FOR WHEEZING OR SHORTNESS OF BREATH. 8.5 g 2   aspirin 81 MG EC tablet Take 1 tablet (81 mg total) by  mouth daily. 90 tablet 0   atorvastatin (LIPITOR) 20 MG tablet Take 1 tablet (20 mg total) by mouth daily. (Patient taking differently: Take 20 mg by mouth at bedtime.) 30 tablet 0   b complex vitamins tablet Take 1 tablet by mouth daily.     budesonide (PULMICORT) 0.25 MG/2ML nebulizer solution Take 0.25 mg by nebulization as needed (shortness of breath/wheezing).     clopidogrel (PLAVIX) 75 MG tablet TAKE 1 TABLET BY MOUTH DAILY WITH BREAKFAST. (Patient taking differently: Take 75 mg by mouth at bedtime.) 90 tablet 3   diclofenac Sodium (VOLTAREN) 1 % GEL Apply 2-4 g topically as needed (knee pain).     FLUoxetine (PROZAC) 40 MG capsule TAKE 1 CAPSULE BY MOUTH DAILY. (Patient taking differently: Take 40 mg by mouth at bedtime.) 90 capsule 1   fluticasone (FLONASE) 50 MCG/ACT nasal spray Place 1 spray into both nostrils at bedtime as needed for allergies.     furosemide (LASIX) 20 MG tablet Take 1 tablet (20 mg total) by mouth daily as needed (for weight gain). 15 tablet 3   hydrOXYzine (  ATARAX/VISTARIL) 25 MG tablet Take 1 tablet (25 mg total) by mouth 3 (three) times daily as needed. (Patient taking differently: Take 25 mg by mouth 3 (three) times daily as needed for anxiety.) 60 tablet 1   Insulin Human (INSULIN PUMP) SOLN Inject into the skin continuous. Humalog     insulin lispro (HUMALOG) 100 UNIT/ML injection Inject 35 units into the skin daily via pump 10 mL 1   ipratropium (ATROVENT) 0.06 % nasal spray Place 2 sprays into both nostrils 2 (two) times daily as needed for rhinitis.     levothyroxine (SYNTHROID) 100 MCG tablet TAKE 1 TABLET (100 MCG TOTAL) BY MOUTH DAILY BEFORE BREAKFAST. (Patient taking differently: Take 100 mcg by mouth daily before breakfast.) 90 tablet 3   metoCLOPramide (REGLAN) 5 MG tablet Take 1 tablet (5 mg total) by mouth every 12 (twelve) hours as needed for nausea or vomiting. 60 tablet 0   metoprolol succinate (TOPROL-XL) 25 MG 24 hr tablet Take 0.5 tablets (12.5 mg  total) by mouth daily. 15 tablet 6   montelukast (SINGULAIR) 10 MG tablet Take 1 tablet (10 mg total) by mouth at bedtime. 30 tablet 4   nitroGLYCERIN (NITROSTAT) 0.4 MG SL tablet Place 1 tablet (0.4 mg total) under the tongue every 5 (five) minutes as needed for chest pain. 25 tablet 6   pantoprazole (PROTONIX) 40 MG tablet Take 1 tablet (40 mg total) by mouth 2 (two) times daily as needed (acid reflux). 60 tablet 3   No facility-administered medications prior to visit.    Allergies  Allergen Reactions   Atorvastatin Other (See Comments)    Myalgias on 24m dose, tolerates 256mok   Crestor [Rosuvastatin Calcium] Other (See Comments)    Severe myalgias and joint pain. Has tolerated atorvastatin though   Monosodium Glutamate Nausea And Vomiting and Other (See Comments)    migraine   Peanut-Containing Drug Products Other (See Comments)    Vomiting, upset stomach and some wheezing   Zetia [Ezetimibe] Other (See Comments)    myalgias   Ciprofloxin Hcl [Ciprofloxacin] Nausea And Vomiting and Other (See Comments)    Severe migraine   Food Color Red [Red Dye] Diarrhea       Objective:    BP 95/66    Pulse 81    Resp 18    Ht _0  (1.651 m)    Wt 138 lb (62.6 kg)    LMP 03/02/2021 (Approximate)    SpO2 100%    BMI 22.96 kg/m  Wt Readings from Last 3 Encounters:  03/25/21 138 lb (62.6 kg)  03/01/21 143 lb (64.9 kg)  01/02/21 148 lb (67.1 kg)    Physical Exam Vitals and nursing note reviewed.  Constitutional:      Appearance: She is well-developed.  HENT:     Head: Normocephalic and atraumatic.  Cardiovascular:     Rate and Rhythm: Normal rate and regular rhythm.     Heart sounds: Normal heart sounds. No murmur heard.   No friction rub. No gallop.  Pulmonary:     Effort: Pulmonary effort is normal. No tachypnea or respiratory distress.     Breath sounds: Normal breath sounds. No decreased breath sounds, wheezing, rhonchi or rales.  Chest:     Chest wall: No tenderness.   Abdominal:     General: Bowel sounds are normal.     Palpations: Abdomen is soft.  Musculoskeletal:        General: Normal range of motion.     Cervical back:  Normal range of motion.  Skin:    General: Skin is warm and dry.  Neurological:     Mental Status: She is alert and oriented to person, place, and time.     Coordination: Coordination normal.  Psychiatric:        Behavior: Behavior normal. Behavior is cooperative.        Thought Content: Thought content normal.        Judgment: Judgment normal.         Patient has been counseled extensively about nutrition and exercise as well as the importance of adherence with medications and regular follow-up. The patient was given clear instructions to go to ER or return to medical center if symptoms don't improve, worsen or new problems develop. The patient verbalized understanding.   Follow-up: Return in about 3 months (around 06/25/2021).   Jennifer Pounds, FNP-BC [facility] [place], [place]   03/25/2021, 12:20 PM

## 2021-03-26 LAB — CMP14+EGFR
ALT: 12 IU/L (ref 0–32)
AST: 21 IU/L (ref 0–40)
Albumin/Globulin Ratio: 1.8 (ref 1.2–2.2)
Albumin: 4.5 g/dL (ref 3.8–4.9)
Alkaline Phosphatase: 84 IU/L (ref 44–121)
BUN/Creatinine Ratio: 8 — ABNORMAL LOW (ref 9–23)
BUN: 6 mg/dL (ref 6–24)
Bilirubin Total: 0.3 mg/dL (ref 0.0–1.2)
CO2: 21 mmol/L (ref 20–29)
Calcium: 9.4 mg/dL (ref 8.7–10.2)
Chloride: 102 mmol/L (ref 96–106)
Creatinine, Ser: 0.75 mg/dL (ref 0.57–1.00)
Globulin, Total: 2.5 g/dL (ref 1.5–4.5)
Glucose: 129 mg/dL — ABNORMAL HIGH (ref 70–99)
Potassium: 4 mmol/L (ref 3.5–5.2)
Sodium: 137 mmol/L (ref 134–144)
Total Protein: 7 g/dL (ref 6.0–8.5)
eGFR: 96 mL/min/{1.73_m2} (ref >59)

## 2021-03-29 LAB — THYROID PANEL WITH TSH
Free Thyroxine Index: 1.3 (ref 1.2–4.9)
T3 Uptake Ratio: 28 % (ref 24–39)
T4, Total: 4.7 ug/dL (ref 4.5–12.0)
TSH: 0.737 u[IU]/mL (ref 0.450–4.500)

## 2021-03-29 LAB — SPECIMEN STATUS REPORT

## 2021-03-30 ENCOUNTER — Other Ambulatory Visit: Payer: Self-pay

## 2021-03-31 ENCOUNTER — Other Ambulatory Visit: Payer: Self-pay

## 2021-04-04 ENCOUNTER — Other Ambulatory Visit: Payer: Self-pay

## 2021-04-06 ENCOUNTER — Other Ambulatory Visit: Payer: Self-pay

## 2021-04-19 ENCOUNTER — Other Ambulatory Visit: Payer: Self-pay

## 2021-04-26 ENCOUNTER — Other Ambulatory Visit: Payer: Self-pay

## 2021-04-27 ENCOUNTER — Other Ambulatory Visit: Payer: Self-pay

## 2021-04-27 ENCOUNTER — Encounter (HOSPITAL_COMMUNITY): Payer: Self-pay | Admitting: Ophthalmology

## 2021-04-27 NOTE — Progress Notes (Signed)
DUE TO COVID-19 ONLY TWO VISITORS ARE ALLOWED TO COME WITH YOU AND STAY IN THE SURGICAL WAITING ROOM ONLY DURING PRE OP AND PROCEDURE DAY OF SURGERY.  ? ?PCP - [name], NP ?Cardiologist - Dr Fransico Him ?Electrophysiologist - Dr Glori Bickers ? ?Chest x-ray - 01/02/21 (1V) ?EKG - 01/02/21 ?Stress Test - n/a ?ECHO TEE - 12/27/20 ?Cardiac Cath - 10/04/20 ? ?ICD Pacemaker/Loop - n/a ? ?Sleep Study -  n/a ?CPAP - none ? ?Patient is on an insulin pump and will not have to cut off insulin pump for her 1 hour long surgery. ? ?If your blood sugar is less than 70 mg/dL, you will need to treat for low blood sugar: ?Treat a low blood sugar (less than 70 mg/dL) with ? cup of clear juice (cranberry or apple), 4 glucose tablets, OR glucose gel. ?Recheck blood sugar in 15 minutes after treatment (to make sure it is greater than 70 mg/dL). If your blood sugar is not greater than 70 mg/dL on recheck, call [phone] for further instructions. ? ?Blood Thinner Instructions:  Follow your surgeon's instructions on when to stop Plavix prior to surgery. Last dose was on the morning of 04/27/21.  ? ?Aspirin Instructions: Follow your surgeon's instructions on when to stop aspirin prior to surgery,  Last dose was on 04/27/21. ? ?ERAS: Clear liquids til 1:15 PM DOS. ? ?Anesthesia review: Yes ? ?STOP now taking any Aspirin (unless otherwise instructed by your surgeon), Aleve, Naproxen, Ibuprofen, Motrin, Advil, Goody's, BC's, all herbal medications, fish oil, and all vitamins.  ? ?Coronavirus Screening ?Covid test n/a.   ?Do you have any of the following symptoms:  ?Cough yes/no: No ?Fever (>100.27F)  yes/no: No ?Runny nose yes/no: No ?Sore throat yes/no: No ?Difficulty breathing/shortness of breath  yes/no: No ? ?Have you traveled in the last 14 days and where? yes/no: No ? ?Patient verbalized understanding of instructions that were given via phone. ?

## 2021-04-27 NOTE — Anesthesia Preprocedure Evaluation (Addendum)
Anesthesia Evaluation  ?Patient identified by MRN, date of birth, ID band ?Patient awake ? ? ? ?Reviewed: ?Allergy & Precautions, NPO status , Patient's Chart, lab work & pertinent test results ? ?Airway ?Mallampati: II ? ?TM Distance: >3 FB ? ? ? ? Dental ?  ?Pulmonary ?shortness of breath,  ?  ?breath sounds clear to auscultation ? ? ? ? ? ? Cardiovascular ?+ CAD and + Past MI  ? ?Rhythm:Regular Rate:Normal ? ? ?  ?Neuro/Psych ?PSYCHIATRIC DISORDERS   ? GI/Hepatic ?Neg liver ROS, GERD  ,  ?Endo/Other  ?diabetesHypothyroidism  ? Renal/GU ?Renal disease  ? ?  ?Musculoskeletal ? ? Abdominal ?  ?Peds ? Hematology ?  ?Anesthesia Other Findings ? ? Reproductive/Obstetrics ? ?  ? ? ? ? ? ? ? ? ? ? ? ? ? ?  ?  ? ? ? ? ? ? ? ?Anesthesia Physical ?Anesthesia Plan ? ?ASA: 3 ? ?Anesthesia Plan: MAC  ? ?Post-op Pain Management:   ? ?Induction: Intravenous ? ?PONV Risk Score and Plan: 3 and Ondansetron, Dexamethasone and Midazolam ? ?Airway Management Planned: Simple Face Mask and Nasal Cannula ? ?Additional Equipment:  ? ?Intra-op Plan:  ? ?Post-operative Plan:  ? ?Informed Consent: I have reviewed the patients History and Physical, chart, labs and discussed the procedure including the risks, benefits and alternatives for the proposed anesthesia with the patient or authorized representative who has indicated his/her understanding and acceptance.  ? ? ? ?Dental advisory given ? ?Plan Discussed with: CRNA and Anesthesiologist ? ?Anesthesia Plan Comments: (PAT note written 04/27/2021 by Myra Gianotti, PA-C. ?)  ? ? ? ? ?Anesthesia Quick Evaluation ? ?

## 2021-04-27 NOTE — Progress Notes (Signed)
Anesthesia Chart Review: ? Case: [id number] Date/Time: 04/28/21 1605  ? Procedure: PHOTOCOAGULATION WITH ENDO LASER VITRECTOMY (Right)  ? Anesthesia type: General  ? Pre-op diagnosis: VITREOUS HEMMORRHAGE RIGHT EYE  ? Location: [facility] OR ROOM 08 / [place] [facility]  ? Surgeons: Jalene Mullet, MD  ? ?  ? ? ?DISCUSSION: Patient is a 51 year old female scheduled for the above procedure. ? ?History includes never smoker, CAD (NSTEMI, s/p DES LAD x3 & DES INTMED branch 03/20/17; ACS s/p DES CX 02/08/18;  s/p CABG: LIMA-LAD-RI, RIMA-OM 11/04/18; NSTEMI 09/2020 in setting of DKA, continue medical therapy), ischemic cardiomyopathy, hypercholesterolemia, DM1 (on insulin pump, Humalog 100U/mL), hypothyroidism, anemia, exertional dyspnea, GERD. ? ?Last visit with cardiologist Dr. Radford Pax was on 03/01/2021.  She had not had any anginal symptoms.  She was continued on aspirin, Plavix, Toprol, atorvastatin.  She was off spironolactone due to hyperkalemia and was not on ARB therapy due to hyperkalemia.  She did not appear volume overloaded on exam.  71-monthfollow-up planned. ? ?She reported case was added "urgently" today (04/27/21) for 04/28/21 surgery, so last ASA and Plavix dose was on 04/27/21. She is going to contact Dr. PPosey Prontoto confirm instructions for these medication on the day of surgery.  ? ?She is a same-day work-up, so anesthesia team to evaluate on the day of surgery. ? ? ?VS: Ht '5\' 5"'$  (1.651 m)   Wt 68 kg   LMP 04/07/2021 (Approximate) Comment: perimenopausal  BMI 24.96 kg/m?  ?BP Readings from Last 3 Encounters:  ?03/25/21 95/66  ?03/01/21 91/63  ?01/03/21 (!) 104/59  ? ?Pulse Readings from Last 3 Encounters:  ?03/25/21 81  ?03/01/21 83  ?01/03/21 63  ?  ? ?PROVIDERS: ?FGildardo Pounds NP is PCP  ?TFransico Him MD is cardiologist ?BGlori Bickers MD is HF cardiologist. Last visit 12/27/20.  ?ERenato Shin MD is endocrinologist.  Last visit 12/13/2020. ? ? ?LABS: Labs on the day of surgery as indicated.  [facility] ?Lab Results  ?Component Value Date  ? WBC 15.4 (H) 01/02/2021  ? HGB 13.6 01/02/2021  ? HCT 40.0 01/02/2021  ? PLT 527 (H) 01/02/2021  ? GLUCOSE 129 (H) 03/25/2021  ? ALT 12 03/25/2021  ? AST 21 03/25/2021  ? NA 137 03/25/2021  ? K 4.0 03/25/2021  ? CL 102 03/25/2021  ? CREATININE 0.75 03/25/2021  ? BUN 6 03/25/2021  ? CO2 21 03/25/2021  ? TSH 0.737 03/25/2021  ? HGBA1C 8.7 (A) 03/25/2021  ? ? ?IMAGES: ?1V PCXR 01/02/21: ?FINDINGS: ?Midline sternotomy overlies stable cardiac silhouette. Lungs ?hyperinflated. No effusion, infiltrate or pneumothorax. ?IMPRESSION: ?Hyperinflated lungs.  No acute findings. ? ? ?EKG: 01/02/2021: Sinus tachycardia 119 bpm.  Right atrial enlargement.  Consider LVH.  Anterior Q waves, possibly due to LVH. ? ? ?CV: ?Echo 12/27/20: ?IMPRESSIONS  ? 1. Left ventricular ejection fraction, by estimation, is 40 to 45%. The  ?left ventricle has mildly decreased function. The left ventricle has no  ?regional wall motion abnormalities. Left ventricular diastolic parameters  ?were normal. There is hypokinesis  ?of the left ventricular, entire anteroseptal wall.  ? 2. Right ventricular systolic function is mildly reduced. The right  ?ventricular size is normal. There is normal pulmonary artery systolic  ?pressure.  ? 3. Left atrial size was moderately dilated.  ? 4. The mitral valve is normal in structure. Trivial mitral valve  ?regurgitation. No evidence of mitral stenosis.  ? 5. The aortic valve is normal in structure. Aortic valve regurgitation  is  ?not visualized. No aortic stenosis is present.  ? 6. The inferior vena cava is normal in size with greater than 50%  ?respiratory variability, suggesting right atrial pressure of 3 mmHg.  ? ? ?Cardiac cath 10/04/20: ?  Mid LAD lesion is 60% stenosed. ?  Ost Cx to Prox Cx lesion is 99% stenosed. ?  Prox Cx to Mid Cx lesion is 70% stenosed. ?  Ramus-1 lesion is 60% stenosed. ?  Ramus-2 lesion is 20% stenosed. ?  Ramus-3 lesion is 70% stenosed. ?   Prox LAD to Mid LAD lesion is 100% stenosed. ?  Dist LAD-1 lesion is 70% stenosed. ?  Dist LAD-2 lesion is 70% stenosed. ?  Non-stenotic Mid LAD to Dist LAD lesion was previously treated. ?  RIMA graft was visualized by angiography and is small. ?  LIMA and is large. ?  The graft exhibits no disease. ?  LV end diastolic pressure is normal. ?  ?Severe native vessel CAD with total occlusion of the proximal LAD, moderate stenosis of the intermediate branch, and severe ostial circumflex in-stent restenosis ?S/P CABG with patency of the both the RIMA-circumflex and LAD sequential to the intermediate and LAD ?Normal LVEDP ?  ?Recommend: continued medical therapy ? ? ?US Carotid 10/31/18: ?Summary:  ?- Right Carotid: Velocities in the right ICA are consistent with a 1-39%  ?stenosis.  ?- Left Carotid: Velocities in the left ICA are consistent with a 1-39%  ?stenosis.  ?- Vertebrals:  Bilateral vertebral arteries demonstrate antegrade flow.  ?- Subclavians: Normal flow hemodynamics were seen in bilateral subclavian  ?             arteries.  ? ?Past Medical History:  ?Diagnosis Date  ? Anemia   ? Anxiety   ? Coronary artery disease   ? a. s/p PTCA DES x3 in the LAD (ostial DES placed, mid DES placed, distal DES placed) and PTCA/DES x1 to intermediate branch 02/2017. b. ACS 02/08/18 with LCx stent placement.  ? Depression   ? Diabetes mellitus   ? type 1, patient is not using her insulin pump.  ? DKA (diabetic ketoacidoses) 12/31/2017  ? Dyspnea   ? with exertion and dust, no oxygen  ? Elevated platelet count   ? GERD (gastroesophageal reflux disease)   ? High cholesterol   ? Hypothyroidism   ? Ischemic cardiomyopathy   ? a. EF low-normal with basal inferior akinesis, basal septal hypokinesis by echo 01/2018.  ? Myocardial infarction [facility]) 2019  ? ? ?Past Surgical History:  ?Procedure Laterality Date  ? APPENDECTOMY    ? CORONARY ARTERY BYPASS GRAFT N/A 11/04/2018  ? Procedure: CORONARY ARTERY BYPASS GRAFTING (CABG), ON PUMP,  TIMES THREE, USING LEFT AND RIGHT INTERNAL MAMMARY ARTERIES;  Surgeon: Wonda Olds, MD;  Location: [place];  Service: Open Heart Surgery;  Laterality: N/A;  ? CORONARY STENT INTERVENTION N/A 03/20/2017  ? Procedure: DES x 3 LAD, DES OM; Surgeon: Burnell Blanks, MD;  Location: [place] [facility];  Service: Cardiovascular;  Laterality: N/A;  ? CORONARY/GRAFT ACUTE MI REVASCULARIZATION N/A 02/08/2018  ? Procedure: Coronary/Graft Acute MI Revascularization;  Surgeon: Belva Crome, MD;  Location: [place] [facility];  Service: Cardiovascular;  Laterality: N/A;  ? LEFT HEART CATH AND CORONARY ANGIOGRAPHY N/A 03/20/2017  ? Procedure: LEFT HEART CATH AND CORONARY ANGIOGRAPHY;  Surgeon: Burnell Blanks, MD;  Location: [place] [facility];  Service: Cardiovascular;  Laterality: N/A;  ? LEFT HEART CATH AND CORONARY ANGIOGRAPHY  N/A 02/08/2018  ? Procedure: LEFT HEART CATH AND CORONARY ANGIOGRAPHY;  Surgeon: Belva Crome, MD;  Location: [place] [facility];  Service: Cardiovascular;  Laterality: N/A;  ? LEFT HEART CATH AND CORONARY ANGIOGRAPHY N/A 10/29/2018  ? Procedure: LEFT HEART CATH AND CORONARY ANGIOGRAPHY;  Surgeon: Burnell Blanks, MD;  Location: [place] [facility];  Service: Cardiovascular;  Laterality: N/A;  ? LEFT HEART CATH AND CORS/GRAFTS ANGIOGRAPHY N/A 10/04/2020  ? Procedure: LEFT HEART CATH AND CORS/GRAFTS ANGIOGRAPHY;  Surgeon: Sherren Mocha, MD;  Location: [place] [facility];  Service: Cardiovascular;  Laterality: N/A;  ? TEE WITHOUT CARDIOVERSION N/A 11/04/2018  ? Procedure: TRANSESOPHAGEAL ECHOCARDIOGRAM (TEE);  Surgeon: Wonda Olds, MD;  Location: [place];  Service: Open Heart Surgery;  Laterality: N/A;  ? ? ?MEDICATIONS: ?No current facility-administered medications for this encounter.  ? ? acetaminophen (TYLENOL) 325 MG tablet  ? albuterol (VENTOLIN HFA) 108 (90 Base) MCG/ACT inhaler  ? aspirin 81 MG EC tablet  ? atorvastatin (LIPITOR) 20 MG tablet  ? b complex vitamins  tablet  ? budesonide (PULMICORT) 0.25 MG/2ML nebulizer solution  ? clopidogrel (PLAVIX) 75 MG tablet  ? diclofenac Sodium (VOLTAREN) 1 % GEL  ? diphenhydrAMINE (BENADRYL) 25 MG tablet  ? FLUoxetine (PROZAC) 40 MG caps

## 2021-04-28 ENCOUNTER — Other Ambulatory Visit: Payer: Self-pay | Admitting: Endocrinology

## 2021-04-28 ENCOUNTER — Encounter (HOSPITAL_COMMUNITY): Payer: Self-pay | Admitting: Ophthalmology

## 2021-04-28 ENCOUNTER — Other Ambulatory Visit: Payer: Self-pay

## 2021-04-28 ENCOUNTER — Ambulatory Visit (HOSPITAL_BASED_OUTPATIENT_CLINIC_OR_DEPARTMENT_OTHER): Payer: Self-pay | Admitting: Vascular Surgery

## 2021-04-28 ENCOUNTER — Encounter (HOSPITAL_COMMUNITY)
Admission: RE | Disposition: A | Discharge status: Home / Self Care | Payer: Self-pay | Source: Home / Self Care | Attending: Ophthalmology

## 2021-04-28 ENCOUNTER — Ambulatory Visit (HOSPITAL_COMMUNITY)
Admission: RE | Admit: 2021-04-28 | Discharge: 2021-04-28 | Disposition: A | Discharge status: Home / Self Care | Payer: Self-pay | Attending: Ophthalmology | Admitting: Ophthalmology

## 2021-04-28 ENCOUNTER — Ambulatory Visit (HOSPITAL_COMMUNITY): Payer: Self-pay | Admitting: Vascular Surgery

## 2021-04-28 DIAGNOSIS — E7800 Pure hypercholesterolemia, unspecified: Secondary | ICD-10-CM | POA: Insufficient documentation

## 2021-04-28 DIAGNOSIS — H3341 Traction detachment of retina, right eye: Secondary | ICD-10-CM

## 2021-04-28 DIAGNOSIS — K219 Gastro-esophageal reflux disease without esophagitis: Secondary | ICD-10-CM | POA: Insufficient documentation

## 2021-04-28 DIAGNOSIS — Z7902 Long term (current) use of antithrombotics/antiplatelets: Secondary | ICD-10-CM | POA: Insufficient documentation

## 2021-04-28 DIAGNOSIS — H4311 Vitreous hemorrhage, right eye: Secondary | ICD-10-CM | POA: Insufficient documentation

## 2021-04-28 DIAGNOSIS — E039 Hypothyroidism, unspecified: Secondary | ICD-10-CM | POA: Insufficient documentation

## 2021-04-28 DIAGNOSIS — Z79899 Other long term (current) drug therapy: Secondary | ICD-10-CM | POA: Insufficient documentation

## 2021-04-28 DIAGNOSIS — R0609 Other forms of dyspnea: Secondary | ICD-10-CM | POA: Insufficient documentation

## 2021-04-28 DIAGNOSIS — E10319 Type 1 diabetes mellitus with unspecified diabetic retinopathy without macular edema: Secondary | ICD-10-CM | POA: Insufficient documentation

## 2021-04-28 DIAGNOSIS — I2510 Atherosclerotic heart disease of native coronary artery without angina pectoris: Secondary | ICD-10-CM

## 2021-04-28 DIAGNOSIS — I252 Old myocardial infarction: Secondary | ICD-10-CM | POA: Insufficient documentation

## 2021-04-28 DIAGNOSIS — Z951 Presence of aortocoronary bypass graft: Secondary | ICD-10-CM | POA: Insufficient documentation

## 2021-04-28 DIAGNOSIS — E119 Type 2 diabetes mellitus without complications: Secondary | ICD-10-CM

## 2021-04-28 DIAGNOSIS — Z955 Presence of coronary angioplasty implant and graft: Secondary | ICD-10-CM | POA: Insufficient documentation

## 2021-04-28 DIAGNOSIS — I255 Ischemic cardiomyopathy: Secondary | ICD-10-CM | POA: Insufficient documentation

## 2021-04-28 DIAGNOSIS — D649 Anemia, unspecified: Secondary | ICD-10-CM | POA: Insufficient documentation

## 2021-04-28 HISTORY — DX: Dyspnea, unspecified: R06.00

## 2021-04-28 HISTORY — PX: GAS/FLUID EXCHANGE: SHX5334

## 2021-04-28 HISTORY — PX: PARS PLANA VITRECTOMY: SHX2166

## 2021-04-28 HISTORY — PX: PHOTOCOAGULATION WITH LASER: SHX6027

## 2021-04-28 HISTORY — DX: Gastro-esophageal reflux disease without esophagitis: K21.9

## 2021-04-28 HISTORY — PX: GAS INSERTION: SHX5336

## 2021-04-28 LAB — BASIC METABOLIC PANEL
Anion gap: 10 (ref 5–15)
BUN: 15 mg/dL (ref 6–20)
CO2: 22 mmol/L (ref 22–32)
Calcium: 8.9 mg/dL (ref 8.9–10.3)
Chloride: 101 mmol/L (ref 98–111)
Creatinine, Ser: 0.84 mg/dL (ref 0.44–1.00)
GFR, Estimated: >60 mL/min (ref >60)
Glucose, Bld: 288 mg/dL — ABNORMAL HIGH (ref 70–99)
Potassium: 4.2 mmol/L (ref 3.5–5.1)
Sodium: 133 mmol/L — ABNORMAL LOW (ref 135–145)

## 2021-04-28 LAB — CBC
HCT: 35.4 % — ABNORMAL LOW (ref 36.0–46.0)
Hemoglobin: 11.5 g/dL — ABNORMAL LOW (ref 12.0–15.0)
MCH: 30.1 pg (ref 26.0–34.0)
MCHC: 32.5 g/dL (ref 30.0–36.0)
MCV: 92.7 fL (ref 80.0–100.0)
Platelets: 385 10*3/uL (ref 150–400)
RBC: 3.82 MIL/uL — ABNORMAL LOW (ref 3.87–5.11)
RDW: 14.9 % (ref 11.5–15.5)
WBC: 5.5 10*3/uL (ref 4.0–10.5)
nRBC: 0 % (ref 0.0–0.2)

## 2021-04-28 LAB — GLUCOSE, CAPILLARY
Glucose-Capillary: 274 mg/dL — ABNORMAL HIGH (ref 70–99)
Glucose-Capillary: 213 mg/dL — ABNORMAL HIGH (ref 70–99)
Glucose-Capillary: 123 mg/dL — ABNORMAL HIGH (ref 70–99)
Glucose-Capillary: 116 mg/dL — ABNORMAL HIGH (ref 70–99)

## 2021-04-28 LAB — POCT PREGNANCY, URINE: Preg Test, Ur: NEGATIVE

## 2021-04-28 SURGERY — PARS PLANA VITRECTOMY WITH 25 GAUGE
Anesthesia: Monitor Anesthesia Care | Site: Eye | Laterality: Right

## 2021-04-28 MED ORDER — CYCLOPENTOLATE HCL 1 % OP SOLN
1.0000 [drp] | OPHTHALMIC | Status: AC | PRN
  Administered 2021-04-28 (×3): 1 [drp] via OPHTHALMIC
  Filled 2021-04-28: qty 2

## 2021-04-28 MED ORDER — LIDOCAINE HCL 2 % IJ SOLN
INTRAMUSCULAR | Status: AC
  Filled 2021-04-28: qty 20

## 2021-04-28 MED ORDER — ORAL CARE MOUTH RINSE
15.0000 mL | Freq: Once | OROMUCOSAL | Status: AC
  Administered 2021-04-28: 15 mL via OROMUCOSAL

## 2021-04-28 MED ORDER — TOBRAMYCIN 0.3 % OP SOLN: 1.0000 [drp] | OPHTHALMIC | Status: DC

## 2021-04-28 MED ORDER — SODIUM CHLORIDE 0.9 % IV SOLN: INTRAVENOUS | Status: DC

## 2021-04-28 MED ORDER — INDOCYANINE GREEN 25 MG IV SOLR
INTRAVENOUS | Status: AC
  Filled 2021-04-28: qty 10

## 2021-04-28 MED ORDER — DEXAMETHASONE SODIUM PHOSPHATE 10 MG/ML IJ SOLN
INTRAMUSCULAR | Status: DC | PRN
  Administered 2021-04-28: 10 mg

## 2021-04-28 MED ORDER — TETRACAINE HCL 0.5 % OP SOLN
OPHTHALMIC | Status: AC
  Filled 2021-04-28: qty 4

## 2021-04-28 MED ORDER — BSS PLUS IO SOLN
INTRAOCULAR | Status: AC
  Filled 2021-04-28: qty 500

## 2021-04-28 MED ORDER — CEFAZOLIN SUBCONJUNCTIVAL INJECTION 100 MG/0.5 ML
100.0000 mg | INJECTION | SUBCONJUNCTIVAL | Status: DC
  Filled 2021-04-28: qty 5

## 2021-04-28 MED ORDER — CEFAZOLIN SUBCONJUNCTIVAL INJECTION 100 MG/0.5 ML
INJECTION | SUBCONJUNCTIVAL | Status: DC | PRN
  Administered 2021-04-28: 100 mg via SUBCONJUNCTIVAL

## 2021-04-28 MED ORDER — PROPARACAINE HCL 0.5 % OP SOLN
1.0000 [drp] | OPHTHALMIC | Status: AC | PRN
  Administered 2021-04-28 (×3): 1 [drp] via OPHTHALMIC
  Filled 2021-04-28: qty 15

## 2021-04-28 MED ORDER — TRIAMCINOLONE ACETONIDE 40 MG/ML IJ SUSP
INTRAMUSCULAR | Status: DC | PRN
  Administered 2021-04-28: 40 mg

## 2021-04-28 MED ORDER — PROPOFOL 10 MG/ML IV BOLUS
INTRAVENOUS | Status: DC | PRN
  Administered 2021-04-28: 60 mg via INTRAVENOUS

## 2021-04-28 MED ORDER — LIDOCAINE HCL 2 % IJ SOLN
INTRAMUSCULAR | Status: DC | PRN
  Administered 2021-04-28: 6 mL via RETROBULBAR

## 2021-04-28 MED ORDER — TOBRAMYCIN-DEXAMETHASONE 0.3-0.1 % OP OINT
TOPICAL_OINTMENT | OPHTHALMIC | Status: AC
  Filled 2021-04-28: qty 3.5

## 2021-04-28 MED ORDER — BSS IO SOLN
INTRAOCULAR | Status: DC | PRN
  Administered 2021-04-28: 15 mL via INTRAOCULAR

## 2021-04-28 MED ORDER — BUPIVACAINE HCL (PF) 0.75 % IJ SOLN
INTRAMUSCULAR | Status: AC
  Filled 2021-04-28: qty 10

## 2021-04-28 MED ORDER — EPINEPHRINE PF 1 MG/ML IJ SOLN
INTRAOCULAR | Status: DC | PRN
  Administered 2021-04-28: 500 mL

## 2021-04-28 MED ORDER — TOBRAMYCIN 0.3 % OP SOLN
1.0000 [drp] | OPHTHALMIC | Status: AC
  Administered 2021-04-28 (×2): 1 [drp] via OPHTHALMIC
  Filled 2021-04-28 (×2): qty 5

## 2021-04-28 MED ORDER — NA CHONDROIT SULF-NA HYALURON 40-30 MG/ML IO SOSY
INTRAOCULAR | Status: DC | PRN
  Administered 2021-04-28: 0.5 mL via INTRAOCULAR

## 2021-04-28 MED ORDER — STERILE WATER FOR INJECTION IJ SOLN
INTRAMUSCULAR | Status: AC
  Filled 2021-04-28: qty 10

## 2021-04-28 MED ORDER — NA CHONDROIT SULF-NA HYALURON 40-30 MG/ML IO SOSY
INTRAOCULAR | Status: AC
  Filled 2021-04-28: qty 0.5

## 2021-04-28 MED ORDER — PROPOFOL 10 MG/ML IV BOLUS
INTRAVENOUS | Status: AC
  Filled 2021-04-28: qty 20

## 2021-04-28 MED ORDER — DEXAMETHASONE SODIUM PHOSPHATE 10 MG/ML IJ SOLN
INTRAMUSCULAR | Status: AC
  Filled 2021-04-28: qty 1

## 2021-04-28 MED ORDER — HYDROMORPHONE HCL 1 MG/ML IJ SOLN: 0.2500 mg | INTRAMUSCULAR | Status: DC | PRN

## 2021-04-28 MED ORDER — LIDOCAINE 2% (20 MG/ML) 5 ML SYRINGE
INTRAMUSCULAR | Status: DC | PRN
  Administered 2021-04-28: 60 mg via INTRAVENOUS

## 2021-04-28 MED ORDER — SODIUM HYALURONATE 10 MG/ML IO SOLUTION
PREFILLED_SYRINGE | INTRAOCULAR | Status: AC
  Filled 2021-04-28: qty 0.85

## 2021-04-28 MED ORDER — HYALURONIDASE HUMAN 150 UNIT/ML IJ SOLN
INTRAMUSCULAR | Status: AC
  Filled 2021-04-28: qty 1

## 2021-04-28 MED ORDER — TRIAMCINOLONE ACETONIDE 40 MG/ML IJ SUSP
INTRAMUSCULAR | Status: AC
  Filled 2021-04-28: qty 5

## 2021-04-28 MED ORDER — CHLORHEXIDINE GLUCONATE 0.12 % MT SOLN: 15.0000 mL | Freq: Once | OROMUCOSAL | Status: AC

## 2021-04-28 MED ORDER — ATROPINE SULFATE 1 % OP SOLN
OPHTHALMIC | Status: AC
  Filled 2021-04-28: qty 5

## 2021-04-28 MED ORDER — TOBRAMYCIN-DEXAMETHASONE 0.3-0.1 % OP OINT
TOPICAL_OINTMENT | OPHTHALMIC | Status: DC | PRN
  Administered 2021-04-28: 1 via OPHTHALMIC

## 2021-04-28 MED ORDER — PHENYLEPHRINE HCL 2.5 % OP SOLN
1.0000 [drp] | OPHTHALMIC | Status: AC | PRN
  Administered 2021-04-28 (×3): 1 [drp] via OPHTHALMIC
  Filled 2021-04-28: qty 2

## 2021-04-28 MED ORDER — EPINEPHRINE PF 1 MG/ML IJ SOLN
INTRAMUSCULAR | Status: AC
  Filled 2021-04-28: qty 1

## 2021-04-28 MED ORDER — TETRACAINE HCL 0.5 % OP SOLN
OPHTHALMIC | Status: DC | PRN
  Administered 2021-04-28: 1 [drp] via OPHTHALMIC

## 2021-04-28 MED ORDER — BSS IO SOLN
INTRAOCULAR | Status: AC
  Filled 2021-04-28: qty 15

## 2021-04-28 MED ORDER — INSULIN ASPART 100 UNIT/ML IJ SOLN
0.0000 [IU] | INTRAMUSCULAR | Status: AC | PRN
  Administered 2021-04-28: 1.5 [IU] via SUBCUTANEOUS
  Administered 2021-04-28: 1.1 [IU] via SUBCUTANEOUS

## 2021-04-28 SURGICAL SUPPLY — 68 items
APL SWBSTK 6 STRL LF DISP (MISCELLANEOUS) ×1
APPLICATOR COTTON TIP 6 STRL (MISCELLANEOUS) ×1 IMPLANT
APPLICATOR COTTON TIP 6IN STRL (MISCELLANEOUS) ×2 IMPLANT
BAND WRIST GAS GREEN (MISCELLANEOUS) IMPLANT
BLADE MVR KNIFE 20G (BLADE) IMPLANT
BLADE STAB KNIFE 15DEG (BLADE) IMPLANT
CABLE BIPOLOR RESECTION CORD (MISCELLANEOUS) ×1 IMPLANT
CANNULA ANT CHAM MAIN (OPHTHALMIC RELATED) IMPLANT
CANNULA DUAL BORE 23G (CANNULA) IMPLANT
CANNULA DUALBORE 25G (CANNULA) IMPLANT
CANNULA VLV SOFT TIP 25G (OPHTHALMIC) ×1 IMPLANT
CANNULA VLV SOFT TIP 25GA (OPHTHALMIC) ×2 IMPLANT
CAUTERY EYE LOW TEMP 1300F FIN (OPHTHALMIC RELATED) IMPLANT
CLSR STERI-STRIP ANTIMIC 1/2X4 (GAUZE/BANDAGES/DRESSINGS) ×2 IMPLANT
COVER MAYO STAND STRL (DRAPES) ×1 IMPLANT
DRAPE HALF SHEET 40X57 (DRAPES) ×2 IMPLANT
DRAPE INCISE 51X51 W/FILM STRL (DRAPES) ×1 IMPLANT
DRAPE RETRACTOR (MISCELLANEOUS) ×2 IMPLANT
FORCEPS GRIESHABER ILM 25G A (INSTRUMENTS) ×1 IMPLANT
GAS AUTO FILL CONSTEL (OPHTHALMIC) ×2
GAS AUTO FILL CONSTELLATION (OPHTHALMIC) IMPLANT
GAS IO PFP 125GM ISPAN CNSTL (MISCELLANEOUS) IMPLANT
GAS TANK CONSTELL (MISCELLANEOUS) ×2
GAS WRIST BAND GREEN (MISCELLANEOUS) ×2
GLOVE SURG SYN 7.5  E (GLOVE) ×2
GLOVE SURG SYN 7.5 E (GLOVE) ×1 IMPLANT
GLOVE SURG SYN 7.5 PF PI (GLOVE) ×1 IMPLANT
GOWN STRL REUS W/ TWL LRG LVL3 (GOWN DISPOSABLE) ×1 IMPLANT
GOWN STRL REUS W/TWL LRG LVL3 (GOWN DISPOSABLE) ×2
KIT BASIN OR (CUSTOM PROCEDURE TRAY) ×2 IMPLANT
KIT TURNOVER KIT B (KITS) ×2 IMPLANT
LENS BIOM SUPER VIEW SET DISP (MISCELLANEOUS) ×2 IMPLANT
MICROPICK 25G (MISCELLANEOUS)
NDL 18GX1X1/2 (RX/OR ONLY) (NEEDLE) ×1 IMPLANT
NDL 25GX 5/8IN NON SAFETY (NEEDLE) ×1 IMPLANT
NDL 27GX1/2 REG BEVEL ECLIP (NEEDLE) ×1 IMPLANT
NDL FILTER BLUNT 18X1 1/2 (NEEDLE) ×1 IMPLANT
NDL HYPO 25GX1X1/2 BEV (NEEDLE) IMPLANT
NDL HYPO 30X.5 LL (NEEDLE) ×2 IMPLANT
NDL RETROBULBAR 25GX1.5 (NEEDLE) ×1 IMPLANT
NEEDLE 18GX1X1/2 (RX/OR ONLY) (NEEDLE) ×4 IMPLANT
NEEDLE 25GX 5/8IN NON SAFETY (NEEDLE) ×2 IMPLANT
NEEDLE 27GX1/2 REG BEVEL ECLIP (NEEDLE) ×2 IMPLANT
NEEDLE FILTER BLUNT 18X 1/2SAF (NEEDLE) ×1
NEEDLE FILTER BLUNT 18X1 1/2 (NEEDLE) ×1 IMPLANT
NEEDLE HYPO 25GX1X1/2 BEV (NEEDLE) IMPLANT
NEEDLE HYPO 30X.5 LL (NEEDLE) ×6 IMPLANT
NEEDLE RETROBULBAR 25GX1.5 (NEEDLE) ×2 IMPLANT
NS IRRIG 1000ML POUR BTL (IV SOLUTION) ×2 IMPLANT
PACK FRAGMATOME (OPHTHALMIC) IMPLANT
PACK VITRECTOMY CUSTOM (CUSTOM PROCEDURE TRAY) ×2 IMPLANT
PAD ARMBOARD 7.5X6 YLW CONV (MISCELLANEOUS) ×4 IMPLANT
PAK PIK VITRECTOMY CVS 25GA (OPHTHALMIC) ×2 IMPLANT
PICK MICROPICK 25G (MISCELLANEOUS) IMPLANT
PROBE ENDO DIATHERMY 25G (MISCELLANEOUS) ×2 IMPLANT
PROBE LASER ILLUM FLEX CVD 25G (OPHTHALMIC) ×1 IMPLANT
ROLLS DENTAL (MISCELLANEOUS) IMPLANT
SCRAPER DIAMOND 25GA (OPHTHALMIC RELATED) IMPLANT
SOL ANTI FOG 6CC (MISCELLANEOUS) ×1 IMPLANT
SOLUTION ANTI FOG 6CC (MISCELLANEOUS) ×1
STOPCOCK 4 WAY LG BORE MALE ST (IV SETS) IMPLANT
SUT VICRYL 7 0 TG140 8 (SUTURE) ×1 IMPLANT
SUT VICRYL 8 0 TG140 8 (SUTURE) IMPLANT
SYR 10ML LL (SYRINGE) ×1 IMPLANT
SYR 20ML LL LF (SYRINGE) ×2 IMPLANT
SYR 5ML LL (SYRINGE) ×2 IMPLANT
SYR TB 1ML LUER SLIP (SYRINGE) ×1 IMPLANT
WATER STERILE IRR 1000ML POUR (IV SOLUTION) ×2 IMPLANT

## 2021-04-28 NOTE — H&P (Signed)
Date of examination:  04/28/21 ? ?Indication for surgery: Vitreous hemorrhage right eye ? ?Pertinent past medical history:  ?Past Medical History:  ?Diagnosis Date  ? Anemia   ? Anxiety   ? Coronary artery disease   ? a. s/p PTCA DES x3 in the LAD (ostial DES placed, mid DES placed, distal DES placed) and PTCA/DES x1 to intermediate branch 02/2017. b. ACS 02/08/18 with LCx stent placement.  ? Depression   ? Diabetes mellitus   ? type 1, patient is not using her insulin pump.  ? DKA (diabetic ketoacidoses) 12/31/2017  ? Dyspnea   ? with exertion and dust, no oxygen  ? Elevated platelet count   ? GERD (gastroesophageal reflux disease)   ? High cholesterol   ? Hypothyroidism   ? Ischemic cardiomyopathy   ? a. EF low-normal with basal inferior akinesis, basal septal hypokinesis by echo 01/2018.  ? Myocardial infarction [facility]) 2019  ? ? ?Pertinent ocular history:  diabetic retinopathy right eye ? ?Pertinent family history:  ?Family History  ?Problem Relation Age of Onset  ? Cancer Mother   ?     T cell lymphoma  ? Coronary artery disease Father   ? Congestive Heart Failure Father   ? Asthma Father   ? ? ?General:  Healthy appearing patient in no distress.   ? ?Eyes:   ? Acuity OD 20/400 ? External: Within normal limits    ? Anterior segment: Within normal limits    ? Fundus: No view - B-scan - VH and membranes ?  ? 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 ?Plan: Vitrectomy, membranectomy and endolaser with possible gas vs silicone oil tamponade right eye ? ?Jalene Mullet, MD ? ? ?

## 2021-04-28 NOTE — Brief Op Note (Signed)
04/28/2021 ? ?7:12 PM ? ?PATIENT:  Jennifer Hogan  51 y.o. female ? ?PRE-OPERATIVE DIAGNOSIS:  VITREOUS HEMMORRHAGE RIGHT EYE ? 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 ?PROCEDURE:  Procedure(s) with comments: ?PARS PLANA VITRECTOMY WITH 25 GAUGE REMOVAL OF TRATIONAL MEMBRANECTOMY RIGHT EYE (Right) - Right eye ?PHOTOCOAGULATION WITH LASER (Right) - Right eye ?GAS/FLUID EXCHANGE (Right) - Right eye ?INSERTION OF C3F8 GAS (Right) - Right eye ?DRAINAGE OF SUBRETINAL FLUID (Right) ? ?SURGEON:  Surgeon(s) and Role: ?   Jalene Mullet, MD - Primary ? ?PHYSICIAN ASSISTANT:  ? ?ASSISTANTS: none  ? ?ANESTHESIA:   local and MAC ? ?EBL:  MINIMAL  ? ?BLOOD ADMINISTERED:none ? ?DRAINS: none  ? ?LOCAL MEDICATIONS USED:  MARCAINE    and LIDOCAINE  ? ?SPECIMEN:  No Specimen ? ?DISPOSITION OF SPECIMEN:  N/A ? ?COUNTS:  YES ? ?TOURNIQUET:  * No tourniquets in log * ? ?DICTATION: .Note written in EPIC ? ?PLAN OF CARE: Discharge to home after PACU ? ?PATIENT DISPOSITION:  PACU - hemodynamically stable. ?  ?Delay start of Pharmacological VTE agent (>24hrs) due to surgical blood loss or risk of bleeding: not applicable ? ?

## 2021-04-28 NOTE — Transfer of Care (Signed)
Immediate Anesthesia Transfer of Care Note ? ?Patient: Jennifer Hogan ? ?Procedure(s) Performed: PARS PLANA VITRECTOMY WITH 25 GAUGE REMOVAL OF TRATIONAL MEMBRANE RIGHT EYE (Right: Eye) ?PHOTOCOAGULATION WITH LASER (Right: Eye) ?GAS/FLUID EXCHANGE (Right: Eye) ?INSERTION OF GAS (Right: Eye) ? ?Patient Location: PACU ? ?Anesthesia Type:MAC ? ?Level of Consciousness: awake, alert  and oriented ? ?Airway & Oxygen Therapy: Patient Spontanous Breathing ? ?Post-op Assessment: Report given to RN and Post -op Vital signs reviewed and stable ? ?Post vital signs: Reviewed and stable ? ?Last Vitals:  ?Vitals Value Taken Time  ?BP    ?Temp    ?Pulse 72 04/28/21 1920  ?Resp 7 04/28/21 1920  ?SpO2 100 % 04/28/21 1920  ?Vitals shown include unvalidated device data. ? ?Last Pain:  ?Vitals:  ? 04/28/21 1410  ?TempSrc: Oral  ?PainSc: 0-No pain  ?  Glu 116 ? ?  ? ?Complications: No notable events documented. ?

## 2021-04-28 NOTE — Discharge Instructions (Signed)
DO NOT SLEEP ON BACK, THE EYE PRESSURE CAN GO UP AND CAUSE VISION LOSS   SLEEP ON SIDE WITH NOSE TO PILLOW  DURING DAY KEEP FACE DOWN.  15 MINUTES EVERY 2 HOURS IT IS OK TO LOOK STRAIGHT AHEAD (USE BATHROOM, EAT, WALK, ETC.)  

## 2021-04-28 NOTE — Anesthesia Postprocedure Evaluation (Signed)
Anesthesia Post Note ? ?Patient: Jennifer Hogan ? ?Procedure(s) Performed: PARS PLANA VITRECTOMY WITH 25 GAUGE REMOVAL OF TRATIONAL MEMBRANE RIGHT EYE; DRAINAGE OF SUBRETINAL FLUID RIGHT EYE (Right: Eye) ?PHOTOCOAGULATION WITH LASER (Right: Eye) ?GAS/FLUID EXCHANGE (Right: Eye) ?INSERTION OF GAS ( C3F8) (Right: Eye) ? ?  ? ?Patient location during evaluation: PACU ?Anesthesia Type: MAC ?Level of consciousness: awake and alert ?Pain management: pain level controlled ?Vital Signs Assessment: post-procedure vital signs reviewed and stable ?Respiratory status: spontaneous breathing, nonlabored ventilation, respiratory function stable and patient connected to nasal cannula oxygen ?Cardiovascular status: stable and blood pressure returned to baseline ?Postop Assessment: no apparent nausea or vomiting ?Anesthetic complications: no ? ? ?No notable events documented. ? ?Last Vitals:  ?Vitals:  ? 04/28/21 1930 04/28/21 1945  ?BP:  95/60  ?Pulse: 65   ?Resp: 12   ?Temp:  36.5 ?C  ?SpO2: 100% 100%  ?  ?Last Pain:  ?Vitals:  ? 04/28/21 1921  ?TempSrc:   ?PainSc: 0-No pain  ? ? ?  ?  ?  ?  ?  ?  ? ?Leon S ? ? ? ? ?

## 2021-04-29 ENCOUNTER — Encounter (HOSPITAL_COMMUNITY): Payer: Self-pay | Admitting: Ophthalmology

## 2021-04-29 ENCOUNTER — Other Ambulatory Visit: Payer: Self-pay

## 2021-04-29 MED ORDER — BRIMONIDINE TARTRATE 0.15 % OP SOLN
1.0000 [drp] | Freq: Two times a day (BID) | OPHTHALMIC | 0 refills | Status: DC
  Filled 2021-04-29: qty 5, 20d supply, fill #0

## 2021-04-29 MED ORDER — PREDNISOLONE ACETATE 1 % OP SUSP
1.0000 [drp] | OPHTHALMIC | 0 refills | Status: DC
  Filled 2021-04-29: qty 10, 21d supply, fill #0

## 2021-04-29 MED ORDER — OFLOXACIN 0.3 % OP SOLN
1.0000 [drp] | Freq: Four times a day (QID) | OPHTHALMIC | 0 refills | Status: DC
  Filled 2021-04-29: qty 5, 10d supply, fill #0

## 2021-04-30 MED ORDER — INSULIN LISPRO 100 UNIT/ML IJ SOLN
INTRAMUSCULAR | 1 refills | Status: DC
  Filled 2021-04-30: qty 10, 28d supply, fill #0
  Filled 2021-08-10: qty 10, 28d supply, fill #1

## 2021-05-02 ENCOUNTER — Other Ambulatory Visit: Payer: Self-pay

## 2021-05-10 ENCOUNTER — Inpatient Hospital Stay (HOSPITAL_COMMUNITY)
Admission: EM | Admit: 2021-05-10 | Discharge: 2021-05-17 | DRG: 871 | Disposition: A | Discharge status: Home / Self Care | Payer: Self-pay | Attending: Internal Medicine | Admitting: Internal Medicine

## 2021-05-10 ENCOUNTER — Emergency Department (HOSPITAL_COMMUNITY): Payer: Self-pay

## 2021-05-10 ENCOUNTER — Other Ambulatory Visit: Payer: Self-pay

## 2021-05-10 DIAGNOSIS — Z9102 Food additives allergy status: Secondary | ICD-10-CM

## 2021-05-10 DIAGNOSIS — Z8249 Family history of ischemic heart disease and other diseases of the circulatory system: Secondary | ICD-10-CM

## 2021-05-10 DIAGNOSIS — F419 Anxiety disorder, unspecified: Secondary | ICD-10-CM | POA: Diagnosis present

## 2021-05-10 DIAGNOSIS — Z881 Allergy status to other antibiotic agents status: Secondary | ICD-10-CM

## 2021-05-10 DIAGNOSIS — Z794 Long term (current) use of insulin: Secondary | ICD-10-CM

## 2021-05-10 DIAGNOSIS — J189 Pneumonia, unspecified organism: Secondary | ICD-10-CM | POA: Diagnosis present

## 2021-05-10 DIAGNOSIS — Z91148 Patient's other noncompliance with medication regimen for other reason: Secondary | ICD-10-CM

## 2021-05-10 DIAGNOSIS — I255 Ischemic cardiomyopathy: Secondary | ICD-10-CM | POA: Diagnosis present

## 2021-05-10 DIAGNOSIS — I4510 Unspecified right bundle-branch block: Secondary | ICD-10-CM | POA: Diagnosis present

## 2021-05-10 DIAGNOSIS — Z7951 Long term (current) use of inhaled steroids: Secondary | ICD-10-CM

## 2021-05-10 DIAGNOSIS — Z951 Presence of aortocoronary bypass graft: Secondary | ICD-10-CM

## 2021-05-10 DIAGNOSIS — F1210 Cannabis abuse, uncomplicated: Secondary | ICD-10-CM | POA: Diagnosis present

## 2021-05-10 DIAGNOSIS — I445 Left posterior fascicular block: Secondary | ICD-10-CM | POA: Diagnosis present

## 2021-05-10 DIAGNOSIS — R571 Hypovolemic shock: Secondary | ICD-10-CM | POA: Diagnosis not present

## 2021-05-10 DIAGNOSIS — I5020 Unspecified systolic (congestive) heart failure: Secondary | ICD-10-CM | POA: Diagnosis present

## 2021-05-10 DIAGNOSIS — E782 Mixed hyperlipidemia: Secondary | ICD-10-CM | POA: Diagnosis present

## 2021-05-10 DIAGNOSIS — Z79890 Hormone replacement therapy (postmenopausal): Secondary | ICD-10-CM

## 2021-05-10 DIAGNOSIS — I214 Non-ST elevation (NSTEMI) myocardial infarction: Secondary | ICD-10-CM | POA: Diagnosis present

## 2021-05-10 DIAGNOSIS — E785 Hyperlipidemia, unspecified: Secondary | ICD-10-CM | POA: Diagnosis present

## 2021-05-10 DIAGNOSIS — E873 Alkalosis: Secondary | ICD-10-CM | POA: Diagnosis present

## 2021-05-10 DIAGNOSIS — K219 Gastro-esophageal reflux disease without esophagitis: Secondary | ICD-10-CM | POA: Diagnosis present

## 2021-05-10 DIAGNOSIS — R079 Chest pain, unspecified: Secondary | ICD-10-CM

## 2021-05-10 DIAGNOSIS — Z7902 Long term (current) use of antithrombotics/antiplatelets: Secondary | ICD-10-CM

## 2021-05-10 DIAGNOSIS — I2510 Atherosclerotic heart disease of native coronary artery without angina pectoris: Secondary | ICD-10-CM | POA: Diagnosis present

## 2021-05-10 DIAGNOSIS — K3184 Gastroparesis: Secondary | ICD-10-CM | POA: Diagnosis not present

## 2021-05-10 DIAGNOSIS — Z7982 Long term (current) use of aspirin: Secondary | ICD-10-CM

## 2021-05-10 DIAGNOSIS — I5042 Chronic combined systolic (congestive) and diastolic (congestive) heart failure: Secondary | ICD-10-CM | POA: Diagnosis present

## 2021-05-10 DIAGNOSIS — Z955 Presence of coronary angioplasty implant and graft: Secondary | ICD-10-CM

## 2021-05-10 DIAGNOSIS — E1059 Type 1 diabetes mellitus with other circulatory complications: Secondary | ICD-10-CM

## 2021-05-10 DIAGNOSIS — J440 Chronic obstructive pulmonary disease with acute lower respiratory infection: Secondary | ICD-10-CM | POA: Diagnosis present

## 2021-05-10 DIAGNOSIS — I9589 Other hypotension: Secondary | ICD-10-CM | POA: Diagnosis not present

## 2021-05-10 DIAGNOSIS — Z9641 Presence of insulin pump (external) (internal): Secondary | ICD-10-CM | POA: Diagnosis present

## 2021-05-10 DIAGNOSIS — Z79899 Other long term (current) drug therapy: Secondary | ICD-10-CM

## 2021-05-10 DIAGNOSIS — E109 Type 1 diabetes mellitus without complications: Secondary | ICD-10-CM | POA: Diagnosis present

## 2021-05-10 DIAGNOSIS — I110 Hypertensive heart disease with heart failure: Secondary | ICD-10-CM | POA: Diagnosis present

## 2021-05-10 DIAGNOSIS — R6510 Systemic inflammatory response syndrome (SIRS) of non-infectious origin without acute organ dysfunction: Secondary | ICD-10-CM | POA: Diagnosis present

## 2021-05-10 DIAGNOSIS — A419 Sepsis, unspecified organism: Principal | ICD-10-CM | POA: Diagnosis present

## 2021-05-10 DIAGNOSIS — E1043 Type 1 diabetes mellitus with diabetic autonomic (poly)neuropathy: Secondary | ICD-10-CM | POA: Diagnosis not present

## 2021-05-10 DIAGNOSIS — L209 Atopic dermatitis, unspecified: Secondary | ICD-10-CM | POA: Diagnosis not present

## 2021-05-10 DIAGNOSIS — E10649 Type 1 diabetes mellitus with hypoglycemia without coma: Secondary | ICD-10-CM | POA: Diagnosis present

## 2021-05-10 DIAGNOSIS — E1010 Type 1 diabetes mellitus with ketoacidosis without coma: Secondary | ICD-10-CM | POA: Diagnosis not present

## 2021-05-10 DIAGNOSIS — E039 Hypothyroidism, unspecified: Secondary | ICD-10-CM | POA: Diagnosis present

## 2021-05-10 DIAGNOSIS — J9601 Acute respiratory failure with hypoxia: Secondary | ICD-10-CM | POA: Diagnosis present

## 2021-05-10 DIAGNOSIS — I252 Old myocardial infarction: Secondary | ICD-10-CM

## 2021-05-10 DIAGNOSIS — Z888 Allergy status to other drugs, medicaments and biological substances status: Secondary | ICD-10-CM

## 2021-05-10 LAB — I-STAT BETA HCG BLOOD, ED (MC, WL, AP ONLY): I-stat hCG, quantitative: <5 m[IU]/mL (ref <5)

## 2021-05-10 LAB — CBC WITH DIFFERENTIAL/PLATELET
Abs Immature Granulocytes: 0.08 10*3/uL — ABNORMAL HIGH (ref 0.00–0.07)
Basophils Absolute: 0 10*3/uL (ref 0.0–0.1)
Basophils Relative: 0 %
Eosinophils Absolute: 0 10*3/uL (ref 0.0–0.5)
Eosinophils Relative: 0 %
HCT: 40.1 % (ref 36.0–46.0)
Hemoglobin: 13.3 g/dL (ref 12.0–15.0)
Immature Granulocytes: 1 %
Lymphocytes Relative: 5 %
Lymphs Abs: 0.8 10*3/uL (ref 0.7–4.0)
MCH: 30.3 pg (ref 26.0–34.0)
MCHC: 33.2 g/dL (ref 30.0–36.0)
MCV: 91.3 fL (ref 80.0–100.0)
Monocytes Absolute: 0.3 10*3/uL (ref 0.1–1.0)
Monocytes Relative: 2 %
Neutro Abs: 13.5 10*3/uL — ABNORMAL HIGH (ref 1.7–7.7)
Neutrophils Relative %: 92 %
Platelets: 518 10*3/uL — ABNORMAL HIGH (ref 150–400)
RBC: 4.39 MIL/uL (ref 3.87–5.11)
RDW: 14.2 % (ref 11.5–15.5)
WBC: 14.7 10*3/uL — ABNORMAL HIGH (ref 4.0–10.5)
nRBC: 0 % (ref 0.0–0.2)

## 2021-05-10 LAB — I-STAT VENOUS BLOOD GAS, ED
Acid-base deficit: 8 mmol/L — ABNORMAL HIGH (ref 0.0–2.0)
Bicarbonate: 16.8 mmol/L — ABNORMAL LOW (ref 20.0–28.0)
Calcium, Ion: 1.06 mmol/L — ABNORMAL LOW (ref 1.15–1.40)
HCT: 41 % (ref 36.0–46.0)
Hemoglobin: 13.9 g/dL (ref 12.0–15.0)
O2 Saturation: 57 %
Potassium: 3.6 mmol/L (ref 3.5–5.1)
Sodium: 137 mmol/L (ref 135–145)
TCO2: 18 mmol/L — ABNORMAL LOW (ref 22–32)
pCO2, Ven: 32 mmHg — ABNORMAL LOW (ref 44–60)
pH, Ven: 7.33 (ref 7.25–7.43)
pO2, Ven: 32 mmHg (ref 32–45)

## 2021-05-10 LAB — BASIC METABOLIC PANEL
Anion gap: 15 (ref 5–15)
Anion gap: 12 (ref 5–15)
BUN: 18 mg/dL (ref 6–20)
BUN: 20 mg/dL (ref 6–20)
CO2: 16 mmol/L — ABNORMAL LOW (ref 22–32)
CO2: 18 mmol/L — ABNORMAL LOW (ref 22–32)
Calcium: 9.1 mg/dL (ref 8.9–10.3)
Calcium: 9.2 mg/dL (ref 8.9–10.3)
Chloride: 106 mmol/L (ref 98–111)
Chloride: 110 mmol/L (ref 98–111)
Creatinine, Ser: 1.09 mg/dL — ABNORMAL HIGH (ref 0.44–1.00)
Creatinine, Ser: 1.09 mg/dL — ABNORMAL HIGH (ref 0.44–1.00)
GFR, Estimated: >60 mL/min (ref >60)
GFR, Estimated: >60 mL/min (ref >60)
Glucose, Bld: 296 mg/dL — ABNORMAL HIGH (ref 70–99)
Glucose, Bld: 62 mg/dL — ABNORMAL LOW (ref 70–99)
Potassium: 3.6 mmol/L (ref 3.5–5.1)
Potassium: 4 mmol/L (ref 3.5–5.1)
Sodium: 137 mmol/L (ref 135–145)
Sodium: 140 mmol/L (ref 135–145)

## 2021-05-10 LAB — CBG MONITORING, ED
Glucose-Capillary: 265 mg/dL — ABNORMAL HIGH (ref 70–99)
Glucose-Capillary: 69 mg/dL — ABNORMAL LOW (ref 70–99)

## 2021-05-10 LAB — BETA-HYDROXYBUTYRIC ACID: Beta-Hydroxybutyric Acid: 1.15 mmol/L — ABNORMAL HIGH (ref 0.05–0.27)

## 2021-05-10 LAB — TROPONIN I (HIGH SENSITIVITY)
Troponin I (High Sensitivity): 347 ng/L (ref <18)
Troponin I (High Sensitivity): 979 ng/L (ref <18)

## 2021-05-10 MED ORDER — SODIUM CHLORIDE 0.9 % IV BOLUS: 1000.0000 mL | Freq: Once | INTRAVENOUS | Status: DC

## 2021-05-10 MED ORDER — NITROGLYCERIN 0.4 MG SL SUBL
0.4000 mg | SUBLINGUAL_TABLET | SUBLINGUAL | Status: DC | PRN
  Administered 2021-05-11: 0.4 mg via SUBLINGUAL
  Filled 2021-05-10: qty 1

## 2021-05-10 MED ORDER — HYDROMORPHONE HCL 1 MG/ML IJ SOLN
0.5000 mg | Freq: Once | INTRAMUSCULAR | Status: AC
  Administered 2021-05-10: 0.5 mg via INTRAVENOUS
  Filled 2021-05-10: qty 1

## 2021-05-10 MED ORDER — HYDROMORPHONE HCL 1 MG/ML IJ SOLN
1.0000 mg | Freq: Once | INTRAMUSCULAR | Status: AC
Start: 2021-05-10 — End: 2021-05-10
  Administered 2021-05-10: 1 mg via INTRAVENOUS
  Filled 2021-05-10: qty 1

## 2021-05-10 MED ORDER — ONDANSETRON HCL 4 MG/2ML IJ SOLN
4.0000 mg | Freq: Once | INTRAMUSCULAR | Status: AC
Start: 2021-05-10 — End: 2021-05-10
  Administered 2021-05-10: 4 mg via INTRAVENOUS
  Filled 2021-05-10: qty 2

## 2021-05-10 MED ORDER — HEPARIN (PORCINE) 25000 UT/250ML-% IV SOLN
800.0000 [IU]/h | INTRAVENOUS | Status: DC
  Administered 2021-05-11: 800 [IU]/h via INTRAVENOUS
  Filled 2021-05-10: qty 250

## 2021-05-10 MED ORDER — SODIUM CHLORIDE 0.9 % IV SOLN
1.0000 g | Freq: Once | INTRAVENOUS | Status: AC
  Administered 2021-05-11: 1 g via INTRAVENOUS
  Filled 2021-05-10: qty 10

## 2021-05-10 MED ORDER — SODIUM CHLORIDE 0.9 % IV BOLUS
1000.0000 mL | Freq: Once | INTRAVENOUS | Status: AC
  Administered 2021-05-10: 1000 mL via INTRAVENOUS

## 2021-05-10 MED ORDER — SODIUM CHLORIDE 0.9 % IV SOLN
100.0000 mg | Freq: Once | INTRAVENOUS | Status: AC
  Administered 2021-05-11: 100 mg via INTRAVENOUS
  Filled 2021-05-10: qty 100

## 2021-05-10 MED ORDER — HEPARIN BOLUS VIA INFUSION
4000.0000 [IU] | Freq: Once | INTRAVENOUS | Status: AC
  Administered 2021-05-11: 4000 [IU] via INTRAVENOUS
  Filled 2021-05-10: qty 4000

## 2021-05-10 NOTE — ED Provider Notes (Signed)
?[facility] [facility] ?Provider Note ? ? ?CSN: [id number] ?Arrival date & time: 05/10/21  1820 ? ?  ? ?History ? ?Chief Complaint  ?Patient presents with  ? Chest Pain  ? ? ?Jennifer Hogan is a 51 y.o. female w/ hx of type 1 diabetes on insulin pump, ischemic cardiomyopathy, HLD, presenting to the ED with nausea, chest pain.  Onset today.  Chest pressure, weakness, sweaty, says her insulin pump "fell off."   ? ?Records show hospitalization for DKA in Dec 2022 ? ?Last echo 12/22 with EF 40-45% ? ?LHC 09/2020 with impression: ?  Mid LAD lesion is 60% stenosed. ?  Ost Cx to Prox Cx lesion is 99% stenosed. ?  Prox Cx to Mid Cx lesion is 70% stenosed. ?  Ramus-1 lesion is 60% stenosed. ?  Ramus-2 lesion is 20% stenosed. ?  Ramus-3 lesion is 70% stenosed. ?  Prox LAD to Mid LAD lesion is 100% stenosed. ?  Dist LAD-1 lesion is 70% stenosed. ?  Dist LAD-2 lesion is 70% stenosed. ?  Non-stenotic Mid LAD to Dist LAD lesion was previously treated. ?  RIMA graft was visualized by angiography and is small. ?  LIMA and is large. ?  The graft exhibits no disease. ?  LV end diastolic pressure is normal. ?  ?Severe native vessel CAD with total occlusion of the proximal LAD, moderate stenosis of the intermediate branch, and severe ostial circumflex in-stent restenosis ?S/P CABG with patency of the both the RIMA-circumflex and LAD sequential to the intermediate and LAD ?Normal LVEDP ?  ?Recommend: continued medical therapy ? ?HPI ? ?  ? ?Home Medications ?Prior to Admission medications   ?Medication Sig Start Date End Date Taking? Authorizing Provider  ?acetaminophen (TYLENOL) 325 MG tablet Take 325-650 mg by mouth daily as needed for mild pain, fever or headache.    [provider]  ?albuterol (VENTOLIN HFA) 108 (90 Base) MCG/ACT inhaler INHALE 2 PUFFS INTO THE LUNGS EVERY 6 (SIX) HOURS AS NEEDED FOR WHEEZING OR SHORTNESS OF BREATH. ?Patient taking differently: Inhale 1-2 puffs into the lungs  every 6 (six) hours as needed for wheezing or shortness of breath. 12/01/20 12/01/21  Charlott Rakes, MD  ?aspirin 81 MG EC tablet Take 1 tablet (81 mg total) by mouth daily. 12/07/17   Amin, Jeanella Flattery, MD  ?atorvastatin (LIPITOR) 20 MG tablet Take 1 tablet (20 mg total) by mouth daily. ?Patient taking differently: Take 20 mg by mouth 2 (two) times a week. 10/07/20   Swayze, Ava, DO  ?b complex vitamins tablet Take 1 tablet by mouth daily.    [provider]  ?brimonidine (ALPHAGAN) 0.15 % ophthalmic solution Place 1 drop into the right eye 2 (two) times daily. 04/29/21   Jalene Mullet, MD  ?budesonide (PULMICORT) 0.25 MG/2ML nebulizer solution Take 0.25 mg by nebulization as needed (shortness of breath/wheezing).    [provider]  ?clopidogrel (PLAVIX) 75 MG tablet TAKE 1 TABLET BY MOUTH DAILY WITH BREAKFAST. ?Patient taking differently: Take 75 mg by mouth at bedtime. 11/01/20   [name], MD  ?diclofenac Sodium (VOLTAREN) 1 % GEL Apply 2-4 g topically as needed (knee pain).    [provider]  ?diphenhydrAMINE (BENADRYL) 25 MG tablet Take 25 mg by mouth at bedtime.    [provider]  ?FLUoxetine (PROZAC) 40 MG capsule TAKE 1 CAPSULE BY MOUTH DAILY. ?Patient taking differently: Take 40 mg by mouth daily as needed (anxiety). 05/21/20 05/21/21  [name],  NP  ?fluticasone (FLONASE) 50 MCG/ACT nasal spray Place 1 spray into both nostrils at bedtime as needed for allergies.    [provider]  ?furosemide (LASIX) 20 MG tablet Take 1 tablet (20 mg total) by mouth daily as needed (for weight gain). 10/14/20   Clegg, Amy D, NP  ?hydrOXYzine (ATARAX/VISTARIL) 25 MG tablet Take 1 tablet (25 mg total) by mouth 3 (three) times daily as needed. ?Patient taking differently: Take 25 mg by mouth daily as needed for itching (allergies). 03/19/19   [name], NP  ?Insulin Human (INSULIN PUMP) SOLN Inject into the skin continuous. Humalog    [provider]   ?insulin lispro (HUMALOG) 100 UNIT/ML injection Inject 35 units into the skin daily via pump 04/30/21 04/30/22  Renato Shin, MD  ?levothyroxine (SYNTHROID) 100 MCG tablet TAKE 1 TABLET (100 MCG TOTAL) BY MOUTH DAILY BEFORE BREAKFAST. ?Patient taking differently: Take 100 mcg by mouth daily before breakfast. 12/01/20 12/01/21  Renato Shin, MD  ?metoCLOPramide (REGLAN) 5 MG tablet Take 1 tablet (5 mg total) by mouth every 12 (twelve) hours as needed for nausea or vomiting. 01/13/21 01/13/22  Charlott Rakes, MD  ?metoprolol succinate (TOPROL-XL) 25 MG 24 hr tablet Take 0.5 tablets (12.5 mg total) by mouth daily. 03/07/21   Bensimhon, Shaune Pascal, MD  ?montelukast (SINGULAIR) 10 MG tablet Take 1 tablet (10 mg total) by mouth at bedtime. 01/13/21   [name], NP  ?Multiple Vitamins-Minerals (ALIVE WOMENS 50+ GUMMY) CHEW Chew 2 capsules by mouth daily.    [provider]  ?nitroGLYCERIN (NITROSTAT) 0.4 MG SL tablet Place 1 tablet (0.4 mg total) under the tongue every 5 (five) minutes as needed for chest pain. 10/14/20   Clegg, Amy D, NP  ?ofloxacin (OCUFLOX) 0.3 % ophthalmic solution Place 1 drop into affected eye(s) 4 (four) times daily. 04/29/21   Jalene Mullet, MD  ?omeprazole (PRILOSEC OTC) 20 MG tablet Take 20 mg by mouth daily as needed (acid reflux).    [provider]  ?OVER THE COUNTER MEDICATION Take 3 capsules by mouth daily before breakfast. [employer] super lysine immune support otc supplement    [provider]  ?OVER THE COUNTER MEDICATION Take 25 mg by mouth at bedtime. CBD oil    [provider]  ?prednisoLONE acetate (PRED FORTE) 1 % ophthalmic suspension Place 1 drop into affected eyes. three times a day for 1 week then  twice a day  for one week then once daily for one week 04/29/21   Jalene Mullet, MD  ?   ? ?Allergies    ?Atorvastatin, Crestor [rosuvastatin calcium], Monosodium glutamate, Zetia [ezetimibe], Ciprofloxin hcl [ciprofloxacin], and Food color red  [red dye]   ? ?Review of Systems   ?Review of Systems ? ?Physical Exam ?Updated Vital Signs ?BP 107/70   Pulse (!) 127   Temp 98.6 ?F (37 ?C) (Oral)   Resp (!) 22   SpO2 99%  ?Physical Exam ?Constitutional:   ?   General: She is not in acute distress. ?HENT:  ?   Head: Normocephalic and atraumatic.  ?Eyes:  ?   Conjunctiva/sclera: Conjunctivae normal.  ?   Pupils: Pupils are equal, round, and reactive to light.  ?Cardiovascular:  ?   Rate and Rhythm: Regular rhythm. Tachycardia present.  ?Pulmonary:  ?   Effort: Pulmonary effort is normal. No respiratory distress.  ?Abdominal:  ?   General: There is no distension.  ?   Tenderness: There is no abdominal tenderness.  ?Skin: ?  General: Skin is warm and dry.  ?Neurological:  ?   General: No focal deficit present.  ?   Mental Status: She is alert. Mental status is at baseline.  ?Psychiatric:     ?   Mood and Affect: Mood normal.     ?   Behavior: Behavior normal.  ? ? ?ED Results / Procedures / Treatments   ?Labs ?(all labs ordered are listed, but only abnormal results are displayed) ?Labs Reviewed  ?BASIC METABOLIC PANEL - Abnormal; Notable for the following components:  ?    Result Value  ? CO2 16 (*)   ? Glucose, Bld 296 (*)   ? Creatinine, Ser 1.09 (*)   ? All other components within normal limits  ?BASIC METABOLIC PANEL - Abnormal; Notable for the following components:  ? CO2 18 (*)   ? Glucose, Bld 62 (*)   ? Creatinine, Ser 1.09 (*)   ? All other components within normal limits  ?BETA-HYDROXYBUTYRIC ACID - Abnormal; Notable for the following components:  ? Beta-Hydroxybutyric Acid 1.15 (*)   ? All other components within normal limits  ?CBC WITH DIFFERENTIAL/PLATELET - Abnormal; Notable for the following components:  ? WBC 14.7 (*)   ? Platelets 518 (*)   ? Neutro Abs 13.5 (*)   ? Abs Immature Granulocytes 0.08 (*)   ? All other components within normal limits  ?CBG MONITORING, ED - Abnormal; Notable for the following components:  ? Glucose-Capillary 265 (*)    ? All other components within normal limits  ?CBG MONITORING, ED - Abnormal; Notable for the following components:  ? Glucose-Capillary 69 (*)   ? All other components within normal limits  ?I-STAT VENOUS

## 2021-05-10 NOTE — Progress Notes (Signed)
ANTICOAGULATION CONSULT NOTE - Initial Consult ? ?Pharmacy Consult for heparin ?Indication: chest pain/ACS ? ?Allergies  ?Allergen Reactions  ? Atorvastatin Other (See Comments)  ?  Myalgias on '80mg'$  dose, tolerates '20mg'$  ok  ? Crestor [Rosuvastatin Calcium] Other (See Comments)  ?  Severe myalgias and joint pain. Has tolerated atorvastatin though  ? Monosodium Glutamate Nausea And Vomiting and Other (See Comments)  ?  migraine  ? Zetia [Ezetimibe] Other (See Comments)  ?  myalgias  ? Ciprofloxin Hcl [Ciprofloxacin] Nausea And Vomiting and Other (See Comments)  ?  Severe migraine  ? Food Color Red [Red Dye] Diarrhea  ? ? ?Patient Measurements: ?  ?Heparin Dosing Weight: 68 kg  ? ?Vital Signs: ?Temp: 98.6 ?F (37 ?C) (04/18 1822) ?Temp Source: Oral (04/18 1822) ?BP: 107/70 (04/18 2300) ?Pulse Rate: 127 (04/18 2305) ? ?Labs: ?Recent Labs  ?  05/10/21 ?1914 05/10/21 ?1948 05/10/21 ?2219  ?HGB 13.3 13.9  --   ?HCT 40.1 41.0  --   ?PLT 518*  --   --   ?CREATININE 1.09*  --  1.09*  ?TROPONINIHS 347*  --  979*  ? ? ?Estimated Creatinine Clearance: 54.3 mL/min (A) (by C-G formula based on SCr of 1.09 mg/dL (H)). ? ? ?Medical History: ?Past Medical History:  ?Diagnosis Date  ? Anemia   ? Anxiety   ? Coronary artery disease   ? a. s/p PTCA DES x3 in the LAD (ostial DES placed, mid DES placed, distal DES placed) and PTCA/DES x1 to intermediate branch 02/2017. b. ACS 02/08/18 with LCx stent placement.  ? Depression   ? Diabetes mellitus   ? type 1, patient is not using her insulin pump.  ? DKA (diabetic ketoacidoses) 12/31/2017  ? Dyspnea   ? with exertion and dust, no oxygen  ? Elevated platelet count   ? GERD (gastroesophageal reflux disease)   ? High cholesterol   ? Hypothyroidism   ? Ischemic cardiomyopathy   ? a. EF low-normal with basal inferior akinesis, basal septal hypokinesis by echo 01/2018.  ? Myocardial infarction [facility]) 2019  ? ? ?Assessment: 51 YO female with chest pain. No anticoagulation reported PTA. CBC WNL.  Troponin 979. ? ? ?Goal of Therapy:  ?Heparin level 0.3-0.7 units/ml ?Monitor platelets by anticoagulation protocol: Yes ?  ?Plan:  ?Give 4000 units bolus x 1 ?Start heparin infusion at 800 units/hr ?Check anti-Xa level in 8 hours and daily while on heparin ?Continue to monitor H&H and platelets ? ?Georga Bora, PharmD ?Clinical Pharmacist ?05/10/2021 11:42 PM ?Please check AMION for all [place] numbers ? ? ? ?

## 2021-05-10 NOTE — ED Notes (Signed)
Lab called to report trop of 347 ?

## 2021-05-10 NOTE — ED Triage Notes (Signed)
Pt here from home via [facility] for dull chest pressure, increased thirst, increased urination and feeling "dry". Pt is a diabetic and reports she believes she is in DKA. EMS gave '4mg'$  zofran and  156m NS. 160/92, 140HR ST, RR 26, 100% on 3L O2 (for comfort), CBG 291, 18g RAC ?

## 2021-05-10 NOTE — ED Notes (Signed)
PT has reapplied her insulin pump and dosed herself 8 units. Now her pump shows her glucose is 70. I will recheck. ?

## 2021-05-11 ENCOUNTER — Inpatient Hospital Stay (HOSPITAL_COMMUNITY): Payer: Self-pay

## 2021-05-11 ENCOUNTER — Encounter (HOSPITAL_COMMUNITY)
Admission: EM | Disposition: A | Discharge status: Home / Self Care | Payer: Self-pay | Source: Home / Self Care | Attending: Internal Medicine

## 2021-05-11 DIAGNOSIS — Z9861 Coronary angioplasty status: Secondary | ICD-10-CM

## 2021-05-11 DIAGNOSIS — I2510 Atherosclerotic heart disease of native coronary artery without angina pectoris: Secondary | ICD-10-CM

## 2021-05-11 DIAGNOSIS — I5021 Acute systolic (congestive) heart failure: Secondary | ICD-10-CM

## 2021-05-11 DIAGNOSIS — I5020 Unspecified systolic (congestive) heart failure: Secondary | ICD-10-CM

## 2021-05-11 DIAGNOSIS — I214 Non-ST elevation (NSTEMI) myocardial infarction: Secondary | ICD-10-CM

## 2021-05-11 DIAGNOSIS — R6510 Systemic inflammatory response syndrome (SIRS) of non-infectious origin without acute organ dysfunction: Secondary | ICD-10-CM

## 2021-05-11 DIAGNOSIS — E1059 Type 1 diabetes mellitus with other circulatory complications: Secondary | ICD-10-CM

## 2021-05-11 DIAGNOSIS — I5033 Acute on chronic diastolic (congestive) heart failure: Secondary | ICD-10-CM

## 2021-05-11 DIAGNOSIS — E039 Hypothyroidism, unspecified: Secondary | ICD-10-CM

## 2021-05-11 DIAGNOSIS — E785 Hyperlipidemia, unspecified: Secondary | ICD-10-CM

## 2021-05-11 DIAGNOSIS — J9601 Acute respiratory failure with hypoxia: Secondary | ICD-10-CM | POA: Diagnosis present

## 2021-05-11 DIAGNOSIS — R7989 Other specified abnormal findings of blood chemistry: Secondary | ICD-10-CM

## 2021-05-11 DIAGNOSIS — F1210 Cannabis abuse, uncomplicated: Secondary | ICD-10-CM

## 2021-05-11 HISTORY — PX: LEFT HEART CATH AND CORS/GRAFTS ANGIOGRAPHY: CATH118250

## 2021-05-11 LAB — COMPREHENSIVE METABOLIC PANEL
ALT: 19 U/L (ref 0–44)
AST: 44 U/L — ABNORMAL HIGH (ref 15–41)
Albumin: 3.9 g/dL (ref 3.5–5.0)
Alkaline Phosphatase: 71 U/L (ref 38–126)
Anion gap: 8 (ref 5–15)
BUN: 23 mg/dL — ABNORMAL HIGH (ref 6–20)
CO2: 22 mmol/L (ref 22–32)
Calcium: 9.1 mg/dL (ref 8.9–10.3)
Chloride: 106 mmol/L (ref 98–111)
Creatinine, Ser: 1.09 mg/dL — ABNORMAL HIGH (ref 0.44–1.00)
GFR, Estimated: >60 mL/min (ref >60)
Glucose, Bld: 128 mg/dL — ABNORMAL HIGH (ref 70–99)
Potassium: 4 mmol/L (ref 3.5–5.1)
Sodium: 136 mmol/L (ref 135–145)
Total Bilirubin: 0.7 mg/dL (ref 0.3–1.2)
Total Protein: 7.3 g/dL (ref 6.5–8.1)

## 2021-05-11 LAB — POCT I-STAT, CHEM 8
BUN: 23 mg/dL — ABNORMAL HIGH (ref 6–20)
Calcium, Ion: 1.12 mmol/L — ABNORMAL LOW (ref 1.15–1.40)
Chloride: 105 mmol/L (ref 98–111)
Creatinine, Ser: 0.8 mg/dL (ref 0.44–1.00)
Glucose, Bld: 160 mg/dL — ABNORMAL HIGH (ref 70–99)
HCT: 35 % — ABNORMAL LOW (ref 36.0–46.0)
Hemoglobin: 11.9 g/dL — ABNORMAL LOW (ref 12.0–15.0)
Potassium: 4 mmol/L (ref 3.5–5.1)
Sodium: 132 mmol/L — ABNORMAL LOW (ref 135–145)
TCO2: 17 mmol/L — ABNORMAL LOW (ref 22–32)

## 2021-05-11 LAB — CBG MONITORING, ED
Glucose-Capillary: 203 mg/dL — ABNORMAL HIGH (ref 70–99)
Glucose-Capillary: 107 mg/dL — ABNORMAL HIGH (ref 70–99)
Glucose-Capillary: 131 mg/dL — ABNORMAL HIGH (ref 70–99)
Glucose-Capillary: 84 mg/dL (ref 70–99)

## 2021-05-11 LAB — LIPID PANEL
Cholesterol: 258 mg/dL — ABNORMAL HIGH (ref 0–200)
HDL: 78 mg/dL (ref >40)
LDL Cholesterol: 167 mg/dL — ABNORMAL HIGH (ref 0–99)
Total CHOL/HDL Ratio: 3.3 RATIO
Triglycerides: 64 mg/dL (ref <150)
VLDL: 13 mg/dL (ref 0–40)

## 2021-05-11 LAB — URINALYSIS, ROUTINE W REFLEX MICROSCOPIC
Bilirubin Urine: NEGATIVE
Glucose, UA: >=500 mg/dL — AB
Hgb urine dipstick: NEGATIVE
Ketones, ur: 20 mg/dL — AB
Leukocytes,Ua: NEGATIVE
Nitrite: NEGATIVE
Protein, ur: 30 mg/dL — AB
Specific Gravity, Urine: 1.02 (ref 1.005–1.030)
pH: 5 (ref 5.0–8.0)

## 2021-05-11 LAB — GLUCOSE, CAPILLARY
Glucose-Capillary: 200 mg/dL — ABNORMAL HIGH (ref 70–99)
Glucose-Capillary: 208 mg/dL — ABNORMAL HIGH (ref 70–99)
Glucose-Capillary: 212 mg/dL — ABNORMAL HIGH (ref 70–99)
Glucose-Capillary: 91 mg/dL (ref 70–99)

## 2021-05-11 LAB — ECHOCARDIOGRAM COMPLETE
Calc EF: 49.4 %
MV M vel: 3.44 m/s
MV Peak grad: 47.3 mmHg
Radius: 0.2 cm
S' Lateral: 4 cm
Single Plane A2C EF: 48.8 %
Single Plane A4C EF: 48.3 %

## 2021-05-11 LAB — CBC
HCT: 36.9 % (ref 36.0–46.0)
Hemoglobin: 12.5 g/dL (ref 12.0–15.0)
MCH: 30.9 pg (ref 26.0–34.0)
MCHC: 33.9 g/dL (ref 30.0–36.0)
MCV: 91.3 fL (ref 80.0–100.0)
Platelets: 449 10*3/uL — ABNORMAL HIGH (ref 150–400)
RBC: 4.04 MIL/uL (ref 3.87–5.11)
RDW: 14.6 % (ref 11.5–15.5)
WBC: 13.5 10*3/uL — ABNORMAL HIGH (ref 4.0–10.5)
nRBC: 0 % (ref 0.0–0.2)

## 2021-05-11 LAB — PROCALCITONIN: Procalcitonin: 1.01 ng/mL

## 2021-05-11 LAB — LIPASE, BLOOD: Lipase: 22 U/L (ref 11–51)

## 2021-05-11 LAB — LACTIC ACID, PLASMA: Lactic Acid, Venous: 1.7 mmol/L (ref 0.5–1.9)

## 2021-05-11 LAB — BRAIN NATRIURETIC PEPTIDE: B Natriuretic Peptide: 2412.6 pg/mL — ABNORMAL HIGH (ref 0.0–100.0)

## 2021-05-11 LAB — HEPARIN LEVEL (UNFRACTIONATED): Heparin Unfractionated: 0.37 IU/mL (ref 0.30–0.70)

## 2021-05-11 LAB — TROPONIN I (HIGH SENSITIVITY)
Troponin I (High Sensitivity): 1580 ng/L (ref <18)
Troponin I (High Sensitivity): 1393 ng/L (ref <18)

## 2021-05-11 LAB — POCT ACTIVATED CLOTTING TIME: Activated Clotting Time: 137 seconds

## 2021-05-11 SURGERY — LEFT HEART CATH AND CORS/GRAFTS ANGIOGRAPHY
Anesthesia: LOCAL

## 2021-05-11 MED ORDER — FLUTICASONE PROPIONATE 50 MCG/ACT NA SUSP: 1.0000 | Freq: Every evening | NASAL | Status: DC | PRN

## 2021-05-11 MED ORDER — SODIUM CHLORIDE 0.9 % IV SOLN: INTRAVENOUS | Status: DC

## 2021-05-11 MED ORDER — SODIUM CHLORIDE 0.9% FLUSH
3.0000 mL | Freq: Two times a day (BID) | INTRAVENOUS | Status: DC
  Administered 2021-05-11 – 2021-05-16 (×10): 3 mL via INTRAVENOUS

## 2021-05-11 MED ORDER — SODIUM CHLORIDE 0.9% FLUSH: 3.0000 mL | Freq: Two times a day (BID) | INTRAVENOUS | Status: DC

## 2021-05-11 MED ORDER — FLUOXETINE HCL 20 MG PO CAPS
40.0000 mg | ORAL_CAPSULE | Freq: Every day | ORAL | Status: DC | PRN
  Administered 2021-05-12 – 2021-05-15 (×3): 40 mg via ORAL
  Filled 2021-05-11 (×3): qty 2

## 2021-05-11 MED ORDER — HEPARIN (PORCINE) IN NACL 1000-0.9 UT/500ML-% IV SOLN
INTRAVENOUS | Status: DC | PRN
  Administered 2021-05-11 (×2): 500 mL

## 2021-05-11 MED ORDER — LORAZEPAM 2 MG/ML IJ SOLN
1.0000 mg | Freq: Four times a day (QID) | INTRAMUSCULAR | Status: DC | PRN
  Administered 2021-05-11 – 2021-05-12 (×2): 1 mg via INTRAVENOUS
  Filled 2021-05-11 (×2): qty 1

## 2021-05-11 MED ORDER — ALBUTEROL SULFATE (2.5 MG/3ML) 0.083% IN NEBU: 2.5000 mg | INHALATION_SOLUTION | Freq: Four times a day (QID) | RESPIRATORY_TRACT | Status: DC | PRN

## 2021-05-11 MED ORDER — BUDESONIDE 0.25 MG/2ML IN SUSP: 0.2500 mg | RESPIRATORY_TRACT | Status: DC | PRN

## 2021-05-11 MED ORDER — ONDANSETRON HCL 4 MG/2ML IJ SOLN
INTRAMUSCULAR | Status: AC
  Filled 2021-05-11: qty 2

## 2021-05-11 MED ORDER — HYDROXYZINE HCL 25 MG PO TABS
25.0000 mg | ORAL_TABLET | Freq: Every day | ORAL | Status: DC | PRN
  Administered 2021-05-13: 25 mg via ORAL
  Filled 2021-05-11: qty 1

## 2021-05-11 MED ORDER — SODIUM CHLORIDE 0.9 % IV SOLN
2.0000 g | INTRAVENOUS | Status: AC
  Administered 2021-05-12 – 2021-05-16 (×5): 2 g via INTRAVENOUS
  Filled 2021-05-11 (×6): qty 20

## 2021-05-11 MED ORDER — CLOPIDOGREL BISULFATE 75 MG PO TABS
75.0000 mg | ORAL_TABLET | Freq: Every day | ORAL | Status: DC
Start: 2021-05-11 — End: 2021-05-17
  Administered 2021-05-11 – 2021-05-16 (×6): 75 mg via ORAL
  Filled 2021-05-11 (×8): qty 1

## 2021-05-11 MED ORDER — ONDANSETRON HCL 4 MG/2ML IJ SOLN
INTRAMUSCULAR | Status: DC | PRN
  Administered 2021-05-11: 4 mg via INTRAVENOUS

## 2021-05-11 MED ORDER — SODIUM CHLORIDE 0.9 % IV SOLN
100.0000 mg | Freq: Two times a day (BID) | INTRAVENOUS | Status: DC
  Administered 2021-05-11 – 2021-05-14 (×8): 100 mg via INTRAVENOUS
  Filled 2021-05-11 (×11): qty 100

## 2021-05-11 MED ORDER — SODIUM CHLORIDE 0.9 % IV SOLN: INTRAVENOUS | Status: AC

## 2021-05-11 MED ORDER — LIDOCAINE HCL (PF) 1 % IJ SOLN
INTRAMUSCULAR | Status: DC | PRN
  Administered 2021-05-11: 10 mL

## 2021-05-11 MED ORDER — SODIUM CHLORIDE 0.9 % IV SOLN
250.0000 mL | INTRAVENOUS | Status: DC | PRN
  Administered 2021-05-13: 250 mL via INTRAVENOUS

## 2021-05-11 MED ORDER — METOPROLOL SUCCINATE ER 25 MG PO TB24
12.5000 mg | ORAL_TABLET | Freq: Every day | ORAL | Status: DC
Start: 2021-05-11 — End: 2021-05-13
  Administered 2021-05-11 – 2021-05-12 (×2): 12.5 mg via ORAL
  Filled 2021-05-11 (×3): qty 1

## 2021-05-11 MED ORDER — SODIUM CHLORIDE 0.9% FLUSH: 3.0000 mL | INTRAVENOUS | Status: DC | PRN

## 2021-05-11 MED ORDER — DEXTROSE 50 % IV SOLN: 1.0000 | INTRAVENOUS | Status: DC | PRN

## 2021-05-11 MED ORDER — ACETAMINOPHEN 325 MG PO TABS
650.0000 mg | ORAL_TABLET | Freq: Four times a day (QID) | ORAL | Status: DC | PRN
  Administered 2021-05-13: 650 mg via ORAL
  Filled 2021-05-11: qty 2

## 2021-05-11 MED ORDER — ACETAMINOPHEN 650 MG RE SUPP: 650.0000 mg | Freq: Four times a day (QID) | RECTAL | Status: DC | PRN

## 2021-05-11 MED ORDER — SODIUM CHLORIDE 0.9 % IV SOLN
25.0000 mg | Freq: Once | INTRAVENOUS | Status: AC
  Administered 2021-05-11: 25 mg via INTRAVENOUS
  Filled 2021-05-11: qty 1

## 2021-05-11 MED ORDER — LIDOCAINE HCL (PF) 1 % IJ SOLN
INTRAMUSCULAR | Status: AC
  Filled 2021-05-11: qty 30

## 2021-05-11 MED ORDER — IOHEXOL 350 MG/ML SOLN
INTRAVENOUS | Status: DC | PRN
  Administered 2021-05-11: 80 mL

## 2021-05-11 MED ORDER — ENOXAPARIN SODIUM 40 MG/0.4ML IJ SOSY
40.0000 mg | PREFILLED_SYRINGE | INTRAMUSCULAR | Status: DC
  Administered 2021-05-12 – 2021-05-17 (×5): 40 mg via SUBCUTANEOUS
  Filled 2021-05-11 (×7): qty 0.4

## 2021-05-11 MED ORDER — MIDAZOLAM HCL 2 MG/2ML IJ SOLN
INTRAMUSCULAR | Status: DC | PRN
  Administered 2021-05-11: 1 mg via INTRAVENOUS

## 2021-05-11 MED ORDER — DIPHENHYDRAMINE HCL 25 MG PO CAPS
25.0000 mg | ORAL_CAPSULE | Freq: Every day | ORAL | Status: DC
  Administered 2021-05-11 – 2021-05-16 (×6): 25 mg via ORAL
  Filled 2021-05-11 (×7): qty 1

## 2021-05-11 MED ORDER — INSULIN PUMP
Freq: Three times a day (TID) | SUBCUTANEOUS | Status: DC
  Administered 2021-05-11 (×2): 1 via SUBCUTANEOUS
  Administered 2021-05-12: 2 via SUBCUTANEOUS
  Administered 2021-05-12: 1 via SUBCUTANEOUS
  Administered 2021-05-12: 0.6 via SUBCUTANEOUS
  Administered 2021-05-13 (×2): 1.4 via SUBCUTANEOUS
  Filled 2021-05-11: qty 1

## 2021-05-11 MED ORDER — FENTANYL CITRATE (PF) 100 MCG/2ML IJ SOLN
INTRAMUSCULAR | Status: AC
  Filled 2021-05-11: qty 2

## 2021-05-11 MED ORDER — METOPROLOL TARTRATE 5 MG/5ML IV SOLN
2.5000 mg | Freq: Once | INTRAVENOUS | Status: DC
  Filled 2021-05-11: qty 5

## 2021-05-11 MED ORDER — MIDAZOLAM HCL 2 MG/2ML IJ SOLN
INTRAMUSCULAR | Status: AC
  Filled 2021-05-11: qty 2

## 2021-05-11 MED ORDER — HEPARIN (PORCINE) IN NACL 1000-0.9 UT/500ML-% IV SOLN
INTRAVENOUS | Status: AC
  Filled 2021-05-11: qty 1000

## 2021-05-11 MED ORDER — LEVOTHYROXINE SODIUM 100 MCG PO TABS
100.0000 ug | ORAL_TABLET | Freq: Every day | ORAL | Status: DC
  Administered 2021-05-12 – 2021-05-17 (×6): 100 ug via ORAL
  Filled 2021-05-11 (×7): qty 1

## 2021-05-11 MED ORDER — FENTANYL CITRATE (PF) 100 MCG/2ML IJ SOLN
INTRAMUSCULAR | Status: DC | PRN
  Administered 2021-05-11: 25 ug via INTRAVENOUS

## 2021-05-11 MED ORDER — ASPIRIN 81 MG PO CHEW: 81.0000 mg | CHEWABLE_TABLET | ORAL | Status: DC

## 2021-05-11 MED ORDER — MONTELUKAST SODIUM 10 MG PO TABS
10.0000 mg | ORAL_TABLET | Freq: Every day | ORAL | Status: DC
Start: 2021-05-11 — End: 2021-05-17
  Administered 2021-05-11 – 2021-05-16 (×6): 10 mg via ORAL
  Filled 2021-05-11 (×7): qty 1

## 2021-05-11 MED ORDER — ASPIRIN 325 MG PO TABS
325.0000 mg | ORAL_TABLET | Freq: Every day | ORAL | Status: DC
  Administered 2021-05-11: 325 mg via ORAL
  Filled 2021-05-11 (×2): qty 1

## 2021-05-11 MED ORDER — MORPHINE SULFATE (PF) 2 MG/ML IV SOLN
2.0000 mg | INTRAVENOUS | Status: DC | PRN
  Administered 2021-05-11: 2 mg via INTRAVENOUS
  Filled 2021-05-11: qty 1

## 2021-05-11 SURGICAL SUPPLY — 12 items
CATH INFINITI 5 FR IM (CATHETERS) ×1 IMPLANT
CATH INFINITI 5FR MULTPACK ANG (CATHETERS) ×1 IMPLANT
CLOSURE MYNX CONTROL 5F (Vascular Products) ×1 IMPLANT
ELECT DEFIB PAD ADLT CADENCE (PAD) ×1 IMPLANT
GUIDEWIRE INQWIRE 1.5J.035X260 (WIRE) IMPLANT
INQWIRE 1.5J .035X260CM (WIRE) ×2
KIT HEART LEFT (KITS) ×2 IMPLANT
PACK CARDIAC CATHETERIZATION (CUSTOM PROCEDURE TRAY) ×2 IMPLANT
SHEATH PINNACLE 5F 10CM (SHEATH) ×1 IMPLANT
SHEATH PROBE COVER 6X72 (BAG) ×2 IMPLANT
TRANSDUCER W/STOPCOCK (MISCELLANEOUS) ×2 IMPLANT
WIRE EMERALD 3MM-J .035X150CM (WIRE) ×1 IMPLANT

## 2021-05-11 NOTE — Progress Notes (Addendum)
Inpatient Diabetes Program Recommendations ? ?AACE/ADA: New Consensus Statement on Inpatient Glycemic Control (2015) ? ?Target Ranges:  Prepandial:   less than 140 mg/dL ?     Peak postprandial:   less than 180 mg/dL (1-2 hours) ?     Critically ill patients:  140 - 180 mg/dL  ? ?Lab Results  ?Component Value Date  ? GLUCAP 107 (H) 05/11/2021  ? HGBA1C 8.7 (A) 03/25/2021  ? ? ?Review of Glycemic Control ? Latest Reference Range & Units 05/10/21 18:27 05/10/21 20:50 05/11/21 01:08 05/11/21 08:45  ?Glucose-Capillary 70 - 99 mg/dL 265 (H) 69 (L) 203 (H) 107 (H)  ?(H): Data is abnormally high ?(L): Data is abnormally low ? ?Diabetes history: DM1 (requires basal, meal coverage, + correction) ?Outpatient Diabetes medications: Medtronic Insulin pump ?basal rate of 0.85 units/hr, 6 AM-10 AM, 0.975 units/hr, 10 AM-10 PM, 0.75 units/hr 10PM-12MN, and 0.625 overnight.  ?bolus of 1 unit/16 grams carbohydrate.  ?correction bolus (which some people call "sensitivity," or "insulin sensitivity ratio," or just "isr") of 1 unit for each 100 by which your glucose exceeds 100.  Total basal = 14.15 units ? ?Spoke with patient at bedside.  She is current with Dr. Loanne Drilling for endocrinology.  She sees him every 3 months.  Confirmed she has not had any pump adjustments since last admission in December.  She Also uses the Medtronic CGM which is currently in place.  Insulin pump order set is in; educated RN how to print the pump contract and flow sheet.    Reminded patient that if she needs scans such as an MRI/Xray the CGM will need to be removed and she should be sure to place her transmitter for her CGM with her personal belongings.  She can call [employer] and order a free replacement.  She is also aware that if she is not alert and oriented at any point her insulin pump will need to be removed and basal/bolus started. She verbalizes understanding. ? ?Reviewed patient's current A1c of 8.7%. Explained what a A1c is and what it measures.  Also reviewed goal A1c with patient, importance of good glucose control @ home, and blood sugar goals.  She states she has recently changed her eating habits for the better and expects her next A1C to come down.  Has occasional episodes of hypoglycemia.  She is aware of signs, symptoms and treatments.  She also states she probably needs to get some pump adjustments because she believes she is starting menopause.   ? ?Will continue to follow while inpatient. ? ?Thank you, ?Reche Dixon, MSN, RN ?Diabetes Coordinator ?Inpatient Diabetes Program ?[phone] (team pager from 8a-5p) ? ? ? ? ? ? ?

## 2021-05-11 NOTE — H&P (View-Only) (Signed)
Although presentation with intractable vomiting and epigastric pain is not a typical presentation for myocardial infarction, it is the typical presentation that this patient has had with previous acute coronary events. ?She still has some mild discomfort although this is more in her chest now. ?High-sensitivity troponin is elevated and rising, consistent with non-STEMI. 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 ?She has known extensive native vessel CAD, including severe in-stent restenosis of the ostial left circumflex coronary artery with a patent RIMA to LCx and patent LIMA to LAD at her most recent cardiac catheterization in September 2022.  No intervention was performed at that time. ?Recommend repeat coronary angiography and revascularization as indicated. This procedure has been fully reviewed with the patient and written informed consent has been obtained. ?Has received aspirin.  Chronically on clopidogrel.  Continue intravenous heparin.  Watch carefully for hypoglycemia in this brittle diabetic.  Hypotension precludes beta-blocker use (tends to run a very low blood pressure and takes metoprolol succinate 12.5 mg daily at home). ?LDL cholesterol is elevated at 167.  (I do not think she is taking her statin dose atorvastatin 20 mg daily is prescribed, Dr. Radford Pax recommended 80 mg daily in her notes). ?Echo ordered, pending. ?

## 2021-05-11 NOTE — H&P (Signed)
?History and Physical  ? ? ?Patient: Jennifer Hogan TWS:[id number] DOB: Oct 03, 1970 ?DOA: 05/10/2021 ?DOS: the patient was seen and examined on 05/11/2021 ?PCP: [name], NP  ?Patient coming from: Home via EMS ? ?Chief Complaint:  ?Chief Complaint  ?Patient presents with  ? Chest Pain  ? ?HPI: Jennifer Hogan is a 51 y.o. female with medical history significant of HTN, HLD, CAD s/p CABG, DM type I on insulin pump, hypothyroidism, and GERD who presents with complaints of chest pain.  After waking up yesterday morning patient reported that she vomited.  At that time her blood sugars were okay, but trended up thereafter and she was concerned that she was headed into DKA.  Noted associated symptoms of abdominal discomfort, nonproductive cough in the last couple days, chills, neck/jaw swelling, and palpitations which she related to vomiting.  She reports having some left-sided chest discomfort for which it was hard to take a deep breath, and felt like someone was sitting on her chest.  Pain was also describes it as a stretching/pulling/tearing sensation with radiation to her left shoulder.  Denied having any significant fever, diaphoresis, orthopnea, leg swelling, or change in weight.  She just recently had right retinal eye surgery about 3 weeks ago for which she has been on eyedrops and some steroids, but no other medication changes reported.  She had taken NSAIDs once or twice in the last 2 weeks due to pain.  Patient would like to continue her insulin pump.  Denies any significant history of tobacco or alcohol use, but does smoke marijuana. ? ?Upon admission into the emergency department patient was noted to be afebrile with pulse elevated up to 146, respirations 12-30, blood pressures as low as 71/57, and O2 saturations noted to be as low as 82% with improvement on 2 L nasal cannula oxygen.  Labs from yesterday noted WBC 14.1, glucose 296, high-sensitivity troponins 347->979.  Chest x-ray noted mild  interstitial prominence concerning for edema, but atypical pneumonia was not excluded.  Patient had initially been given 2 L bolus of IV fluids, Zofran, Dilaudid IV, Rocephin, and doxycycline. ? ?Review of Systems: As mentioned in the history of present illness. All other systems reviewed and are negative. ?Past Medical History:  ?Diagnosis Date  ? Anemia   ? Anxiety   ? Coronary artery disease   ? a. s/p PTCA DES x3 in the LAD (ostial DES placed, mid DES placed, distal DES placed) and PTCA/DES x1 to intermediate branch 02/2017. b. ACS 02/08/18 with LCx stent placement.  ? Depression   ? Diabetes mellitus   ? type 1, patient is not using her insulin pump.  ? DKA (diabetic ketoacidoses) 12/31/2017  ? Dyspnea   ? with exertion and dust, no oxygen  ? Elevated platelet count   ? GERD (gastroesophageal reflux disease)   ? High cholesterol   ? Hypothyroidism   ? Ischemic cardiomyopathy   ? a. EF low-normal with basal inferior akinesis, basal septal hypokinesis by echo 01/2018.  ? Myocardial infarction [facility]) 2019  ? ?Past Surgical History:  ?Procedure Laterality Date  ? APPENDECTOMY    ? CORONARY ARTERY BYPASS GRAFT N/A 11/04/2018  ? Procedure: CORONARY ARTERY BYPASS GRAFTING (CABG), ON PUMP, TIMES THREE, USING LEFT AND RIGHT INTERNAL MAMMARY ARTERIES;  Surgeon: Wonda Olds, MD;  Location: [place];  Service: Open Heart Surgery;  Laterality: N/A;  ? CORONARY STENT INTERVENTION N/A 03/20/2017  ? Procedure: DES x 3 LAD, DES OM; Surgeon: Burnell Blanks, MD;  Location: [place] [facility];  Service: Cardiovascular;  Laterality: N/A;  ? CORONARY/GRAFT ACUTE MI REVASCULARIZATION N/A 02/08/2018  ? Procedure: Coronary/Graft Acute MI Revascularization;  Surgeon: Belva Crome, MD;  Location: [place] [facility];  Service: Cardiovascular;  Laterality: N/A;  ? GAS INSERTION Right 04/28/2021  ? Procedure: INSERTION OF GAS ( C3F8);  Surgeon: Jalene Mullet, MD;  Location: [place];  Service: Ophthalmology;  Laterality: Right;  Right  eye  ? GAS/FLUID EXCHANGE Right 04/28/2021  ? Procedure: GAS/FLUID EXCHANGE;  Surgeon: Jalene Mullet, MD;  Location: [place];  Service: Ophthalmology;  Laterality: Right;  Right eye  ? LEFT HEART CATH AND CORONARY ANGIOGRAPHY N/A 03/20/2017  ? Procedure: LEFT HEART CATH AND CORONARY ANGIOGRAPHY;  Surgeon: Burnell Blanks, MD;  Location: [place] [facility];  Service: Cardiovascular;  Laterality: N/A;  ? LEFT HEART CATH AND CORONARY ANGIOGRAPHY N/A 02/08/2018  ? Procedure: LEFT HEART CATH AND CORONARY ANGIOGRAPHY;  Surgeon: Belva Crome, MD;  Location: [place] [facility];  Service: Cardiovascular;  Laterality: N/A;  ? LEFT HEART CATH AND CORONARY ANGIOGRAPHY N/A 10/29/2018  ? Procedure: LEFT HEART CATH AND CORONARY ANGIOGRAPHY;  Surgeon: Burnell Blanks, MD;  Location: [place] [facility];  Service: Cardiovascular;  Laterality: N/A;  ? LEFT HEART CATH AND CORS/GRAFTS ANGIOGRAPHY N/A 10/04/2020  ? Procedure: LEFT HEART CATH AND CORS/GRAFTS ANGIOGRAPHY;  Surgeon: Sherren Mocha, MD;  Location: [place] [facility];  Service: Cardiovascular;  Laterality: N/A;  ? PARS PLANA VITRECTOMY Right 04/28/2021  ? Procedure: PARS PLANA VITRECTOMY WITH 25 GAUGE REMOVAL OF TRATIONAL MEMBRANE RIGHT EYE; DRAINAGE OF SUBRETINAL FLUID RIGHT EYE;  Surgeon: Jalene Mullet, MD;  Location: [place];  Service: Ophthalmology;  Laterality: Right;  Right eye  ? PHOTOCOAGULATION WITH LASER Right 04/28/2021  ? Procedure: PHOTOCOAGULATION WITH LASER;  Surgeon: Jalene Mullet, MD;  Location: [place];  Service: Ophthalmology;  Laterality: Right;  Right eye  ? TEE WITHOUT CARDIOVERSION N/A 11/04/2018  ? Procedure: TRANSESOPHAGEAL ECHOCARDIOGRAM (TEE);  Surgeon: Wonda Olds, MD;  Location: [place];  Service: Open Heart Surgery;  Laterality: N/A;  ? ?Social History:  reports that she has never smoked. She has never used smokeless tobacco. She reports that she does not currently use drugs after having used the following drugs: Marijuana. She  reports that she does not drink alcohol. ? ?Allergies  ?Allergen Reactions  ? Atorvastatin Other (See Comments)  ?  Myalgias on '80mg'$  dose, tolerates '20mg'$  ok  ? Crestor [Rosuvastatin Calcium] Other (See Comments)  ?  Severe myalgias and joint pain. Has tolerated atorvastatin though  ? Monosodium Glutamate Nausea And Vomiting and Other (See Comments)  ?  migraine  ? Zetia [Ezetimibe] Other (See Comments)  ?  myalgias  ? Ciprofloxin Hcl [Ciprofloxacin] Nausea And Vomiting and Other (See Comments)  ?  Severe migraine  ? Food Color Red [Red Dye] Diarrhea  ? ? ?Family History  ?Problem Relation Age of Onset  ? Cancer Mother   ?     T cell lymphoma  ? Coronary artery disease Father   ? Congestive Heart Failure Father   ? Asthma Father   ? ? ?Prior to Admission medications   ?Medication Sig Start Date End Date Taking? Authorizing Provider  ?acetaminophen (TYLENOL) 325 MG tablet Take 325-650 mg by mouth daily as needed for mild pain, fever or headache.   Yes [provider]  ?albuterol (VENTOLIN HFA) 108 (90 Base) MCG/ACT inhaler INHALE 2 PUFFS INTO THE LUNGS EVERY 6 (  SIX) HOURS AS NEEDED FOR WHEEZING OR SHORTNESS OF BREATH. ?Patient taking differently: Inhale 1-2 puffs into the lungs every 6 (six) hours as needed for wheezing or shortness of breath. 12/01/20 12/01/21 Yes Charlott Rakes, MD  ?aspirin 81 MG EC tablet Take 1 tablet (81 mg total) by mouth daily. ?Patient taking differently: Take 81 mg by mouth at bedtime. 12/07/17  Yes Amin, Jeanella Flattery, MD  ?b complex vitamins tablet Take 1 tablet by mouth daily.   Yes [provider]  ?brimonidine (ALPHAGAN) 0.15 % ophthalmic solution Place 1 drop into the right eye 2 (two) times daily. 04/29/21  Yes Jalene Mullet, MD  ?budesonide (PULMICORT) 0.25 MG/2ML nebulizer solution Take 0.25 mg by nebulization as needed (shortness of breath/wheezing).   Yes [provider]  ?clopidogrel (PLAVIX) 75 MG tablet TAKE 1 TABLET BY MOUTH DAILY WITH  BREAKFAST. ?Patient taking differently: Take 75 mg by mouth at bedtime. 11/01/20  Yes [name], MD  ?diclofenac Sodium (VOLTAREN) 1 % GEL Apply 2-4 g topically as needed (knee pain).   Yes [provider]  ?diph

## 2021-05-11 NOTE — Progress Notes (Signed)
ANTICOAGULATION CONSULT NOTE ? ?Pharmacy Consult for heparin ?Indication: chest pain/ACS ? ?Patient Measurements: ?  ?Heparin Dosing Weight: 68 kg  ? ?Vital Signs: ?BP: 86/65 (04/19 0935) ?Pulse Rate: 93 (04/19 0935) ? ?Labs: ?Recent Labs  ?  05/10/21 ?1914 05/10/21 ?1948 05/10/21 ?2219 05/11/21 ?0747  ?HGB 13.3 13.9  --  12.5  ?HCT 40.1 41.0  --  36.9  ?PLT 518*  --   --  449*  ?HEPARINUNFRC  --   --   --  0.37  ?CREATININE 1.09*  --  1.09* 1.09*  ?TROPONINIHS 347*  --  979* 1,580*  ? ? ? ?Estimated Creatinine Clearance: 54.3 mL/min (A) (by C-G formula based on SCr of 1.09 mg/dL (H)). ? ?Assessment: 51 YO female with chest pain. No anticoagulation reported PTA. CBC WNL. Troponin 979 and now increasing. Started on IV heparin and initial heparin level is therapeutic. No bleeding noted. Planning cardiac cath today.  ? ?Goal of Therapy:  ?Heparin level 0.3-0.7 units/ml ?Monitor platelets by anticoagulation protocol: Yes ?  ?Plan:  ?Continue heparin gtt 800 units/hr ?F/u cath ? ?Salome Arnt, PharmD, BCPS ?Clinical Pharmacist ?Please see AMION for all pharmacy numbers ?05/11/2021 9:52 AM ? ? ? ? ?

## 2021-05-11 NOTE — Progress Notes (Signed)
Although presentation with intractable vomiting and epigastric pain is not a typical presentation for myocardial infarction, it is the typical presentation that this patient has had with previous acute coronary events. ?She still has some mild discomfort although this is more in her chest now. ?High-sensitivity troponin is elevated and rising, consistent with non-STEMI. 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 ?She has known extensive native vessel CAD, including severe in-stent restenosis of the ostial left circumflex coronary artery with a patent RIMA to LCx and patent LIMA to LAD at her most recent cardiac catheterization in September 2022.  No intervention was performed at that time. ?Recommend repeat coronary angiography and revascularization as indicated. This procedure has been fully reviewed with the patient and written informed consent has been obtained. ?Has received aspirin.  Chronically on clopidogrel.  Continue intravenous heparin.  Watch carefully for hypoglycemia in this brittle diabetic.  Hypotension precludes beta-blocker use (tends to run a very low blood pressure and takes metoprolol succinate 12.5 mg daily at home). ?LDL cholesterol is elevated at 167.  (I do not think she is taking her statin dose atorvastatin 20 mg daily is prescribed, Dr. Radford Pax recommended 80 mg daily in her notes). ?Echo ordered, pending. ?

## 2021-05-11 NOTE — Interval H&P Note (Signed)
History and Physical Interval Note: ? ?05/11/2021 ?1:23 PM ? ?Jennifer Hogan  has presented today for surgery, with the diagnosis of nstemi.  The various methods of treatment have been discussed with the patient and family. After consideration of risks, benefits and other options for treatment, the patient has consented to  Procedure(s): ?LEFT HEART CATH AND CORS/GRAFTS ANGIOGRAPHY (N/A) as a surgical intervention.  The patient's history has been reviewed, patient examined, no change in status, stable for surgery.  I have reviewed the patient's chart and labs.  Questions were answered to the patient's satisfaction.   ? ?Cath Lab Visit (complete for each Cath Lab visit) ? ?Clinical Evaluation Leading to the Procedure:  ? ?ACS: Yes.   ? ?Non-ACS:   ? ?Anginal Classification: CCS III ? ?Anti-ischemic medical therapy: Minimal Therapy (1 class of medications) ? ?Non-Invasive Test Results: No non-invasive testing performed ? ?Prior CABG: Previous CABG ? ? ? ? ? ? ?Jennifer Hogan ?05/11/2021 ?1:23 PM ? ? ? ?

## 2021-05-11 NOTE — Progress Notes (Signed)
Patient arrived to 4 e-11, mews score red, patient very anxious, vitals rechecked Q15x4, Q30x2. Patient calming down and not in any distress, Charge aware of mews score. ?

## 2021-05-11 NOTE — Progress Notes (Signed)
?  Echocardiogram ?2D Echocardiogram has been performed. ? ?Jennifer Hogan ?05/11/2021, 11:45 AM ?

## 2021-05-11 NOTE — Consult Note (Signed)
?Cardiology Consultation:  ? ?[name] ?MRN: [id number]; DOB: Jan 21, 1971 ? ?Admit date: 05/10/2021 ?Date of Consult: 05/11/2021 ? ?PCP:  [name], NP ?  ?[facility] Providers ?Cardiologist:  Fransico Him, MD      ? ? ?Patient Profile:  ? ?Jennifer Hogan is a 51 y.o. 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 Langston Masker. ? ?History of Present Illness:  ? ?Ms. Nestler [place] a[place].  She had an episode of vomiting and abdominal pain this morning.  She subsequently had episodic abdominal pain and vomiting throughout the day and has been unable to tolerate oral intake.  No bloody vomitus.  She denied frank chest pain to me but said the pain was more in her upper abdomen, though she has experienced this with prior MIs.  She trialed nitroglycerin and it did not alleviate her symptoms.  No shortness of breath, orthopnea, PND, or palpitations.  She is aware of her heart racing when she's in distress from vomiting.  She noticed a rash over her right upper shoulder but showed this to me and it is scant and appears consistent with atopic dermatitis.  She denies alcohol use and does not smoke but has been smoking marijuana almost daily. ? ?Her only other significant contribution to the history is that she's been having significant stomach discomfort for weeks now.  She avoids any acidic or heavy food because it will irritate her stomach and cause vomiting.  She has had difficulty with her vision since her recent photocoagulation with her ophthalmologist. ? ? ?Past Medical History:  ?Diagnosis Date  ? Anemia   ? Anxiety   ? Coronary artery disease   ? a. s/p PTCA DES x3 in the LAD (ostial DES placed, mid DES placed, distal DES placed) and PTCA/DES x1 to intermediate branch  02/2017. b. ACS 02/08/18 with LCx stent placement.  ? Depression   ? Diabetes mellitus   ? type 1, patient is not using her insulin pump.  ? DKA (diabetic ketoacidoses) 12/31/2017  ? Dyspnea   ? with exertion and dust, no oxygen  ? Elevated platelet count   ? GERD (gastroesophageal reflux disease)   ? High cholesterol   ? Hypothyroidism   ? Ischemic cardiomyopathy   ? a. EF low-normal with basal inferior akinesis, basal septal hypokinesis by echo 01/2018.  ? Myocardial infarction [facility]) 2019  ? ? ?Past Surgical History:  ?Procedure Laterality Date  ? APPENDECTOMY    ? CORONARY ARTERY BYPASS GRAFT N/A 11/04/2018  ? Procedure: CORONARY ARTERY BYPASS GRAFTING (CABG), ON PUMP, TIMES THREE, USING LEFT AND RIGHT INTERNAL MAMMARY ARTERIES;  Surgeon: Wonda Olds, MD;  Location: [place];  Service: Open Heart Surgery;  Laterality: N/A;  ? CORONARY STENT INTERVENTION N/A 03/20/2017  ? Procedure: DES x 3 LAD, DES OM; Surgeon: Burnell Blanks, MD;  Location: [place] [facility];  Service: Cardiovascular;  Laterality: N/A;  ? CORONARY/GRAFT ACUTE MI REVASCULARIZATION N/A 02/08/2018  ? Procedure: Coronary/Graft Acute MI Revascularization;  Surgeon: Belva Crome, MD;  Location: [place] [facility];  Service: Cardiovascular;  Laterality: N/A;  ? GAS INSERTION Right 04/28/2021  ? Procedure: INSERTION OF GAS ( C3F8);  Surgeon: Jalene Mullet, MD;  Location: [place];  Service: Ophthalmology;  Laterality: Right;  Right eye  ? GAS/FLUID EXCHANGE Right 04/28/2021  ? Procedure: GAS/FLUID EXCHANGE;  Surgeon: Jalene Mullet, MD;  Location: [place];  Service: Ophthalmology;  Laterality: Right;  Right eye  ? LEFT HEART CATH AND CORONARY ANGIOGRAPHY N/A 03/20/2017  ? Procedure: LEFT HEART CATH AND CORONARY ANGIOGRAPHY;  Surgeon: Burnell Blanks, MD;  Location: [place] [facility];  Service: Cardiovascular;  Laterality: N/A;  ? LEFT HEART CATH AND CORONARY ANGIOGRAPHY N/A 02/08/2018  ? Procedure: LEFT HEART CATH AND CORONARY ANGIOGRAPHY;   Surgeon: Belva Crome, MD;  Location: [place] [facility];  Service: Cardiovascular;  Laterality: N/A;  ? LEFT HEART CATH AND CORONARY ANGIOGRAPHY N/A 10/29/2018  ? Procedure: LEFT HEART CATH AND CORONARY ANGIOGRAPHY;  Surgeon: Burnell Blanks, MD;  Location: [place] [facility];  Service: Cardiovascular;  Laterality: N/A;  ? LEFT HEART CATH AND CORS/GRAFTS ANGIOGRAPHY N/A 10/04/2020  ? Procedure: LEFT HEART CATH AND CORS/GRAFTS ANGIOGRAPHY;  Surgeon: Sherren Mocha, MD;  Location: [place] [facility];  Service: Cardiovascular;  Laterality: N/A;  ? PARS PLANA VITRECTOMY Right 04/28/2021  ? Procedure: PARS PLANA VITRECTOMY WITH 25 GAUGE REMOVAL OF TRATIONAL MEMBRANE RIGHT EYE; DRAINAGE OF SUBRETINAL FLUID RIGHT EYE;  Surgeon: Jalene Mullet, MD;  Location: [place];  Service: Ophthalmology;  Laterality: Right;  Right eye  ? PHOTOCOAGULATION WITH LASER Right 04/28/2021  ? Procedure: PHOTOCOAGULATION WITH LASER;  Surgeon: Jalene Mullet, MD;  Location: [place];  Service: Ophthalmology;  Laterality: Right;  Right eye  ? TEE WITHOUT CARDIOVERSION N/A 11/04/2018  ? Procedure: TRANSESOPHAGEAL ECHOCARDIOGRAM (TEE);  Surgeon: Wonda Olds, MD;  Location: [place];  Service: Open Heart Surgery;  Laterality: N/A;  ?  ? ? ? ?Inpatient Medications: ?Scheduled Meds: ? heparin  4,000 Units Intravenous Once  ? ?Continuous Infusions: ? cefTRIAXone (ROCEPHIN)  IV    ? doxycycline (VIBRAMYCIN) IV    ? heparin    ? sodium chloride Stopped (05/10/21 2302)  ? ?PRN Meds: ?nitroGLYCERIN ? ?Allergies:    ?Allergies  ?Allergen Reactions  ? Atorvastatin Other (See Comments)  ?  Myalgias on '80mg'$  dose, tolerates '20mg'$  ok  ? Crestor [Rosuvastatin Calcium] Other (See Comments)  ?  Severe myalgias and joint pain. Has tolerated atorvastatin though  ? Monosodium Glutamate Nausea And Vomiting and Other (See Comments)  ?  migraine  ? Zetia [Ezetimibe] Other (See Comments)  ?  myalgias  ? Ciprofloxin Hcl [Ciprofloxacin] Nausea And Vomiting and Other  (See Comments)  ?  Severe migraine  ? Food Color Red [Red Dye] Diarrhea  ? ? ?Social History:   ?Social History  ? ?Socioeconomic History  ? Marital status: Divorced  ?  Spouse name: Not on file  ? Number of children: Not on file  ? Years of education: Not on file  ? Highest education level: Professional school degree (e.g., MD, DDS, DVM, JD)  ?Occupational History  ? 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 Occupation: adjunct facilty at [employer]  ?  Comment: Chemistry professor  ?Tobacco Use  ? Smoking status: Never  ? Smokeless tobacco: Never  ? Tobacco comments:  ?  Use marijuana  ?Vaping Use  ? Vaping Use: Never used  ?Substance and Sexual Activity  ? Alcohol use: No  ?  Alcohol/week: 0.0 standard drinks  ? Drug use: Not Currently  ?  Types: Marijuana  ?  Comment: last use 04/25/21  ? Sexual activity: Not Currently  ?  Birth control/protection: None  ?Other  Topics Concern  ? Not on file  ?Social History Narrative  ? Not on file  ? ?Social Determinants of Health  ? ?Financial Resource Strain: High Risk  ? Difficulty of Paying Living Expenses: Very hard  ?Food Insecurity: Food Insecurity Present  ? Worried About Charity fundraiser in the Last Year: Sometimes true  ? Ran Out of Food in the Last Year: Sometimes true  ?Transportation Needs: No Transportation Needs  ? Lack of Transportation (Medical): No  ? Lack of Transportation (Non-Medical): No  ?Physical Activity: Not on file  ?Stress: Not on file  ?Social Connections: Not on file  ?Intimate Partner Violence: Not on file  ?  ?Family History:   ?Family History  ?Problem Relation Age of Onset  ? Cancer Mother   ?     T cell lymphoma  ? Coronary artery disease Father   ? Congestive Heart Failure Father   ? Asthma Father   ?  ? ?ROS:  ?Please see the history of present illness.  ?All other ROS reviewed and negative.    ? ?Physical Exam/Data:  ? ?Vitals:  ? 05/10/21 1822 05/10/21 2245 05/10/21 2300 05/10/21 2305  ?BP: (!) 156/100 92/65 107/70   ?Pulse: (!) 135 (!) 113  (!) 115 (!) 127  ?Resp: (!) '21 10 13 '$ (!) 22  ?Temp: 98.6 ?F (37 ?C)     ?TempSrc: Oral     ?SpO2: 100% 100% 99% 99%  ? ? ?Intake/Output Summary (Last 24 hours) at 05/11/2021 0001 ?Last data filed at 05/10/2021 2

## 2021-05-12 ENCOUNTER — Encounter (HOSPITAL_COMMUNITY): Payer: Self-pay | Admitting: Cardiology

## 2021-05-12 LAB — BASIC METABOLIC PANEL
Anion gap: 14 (ref 5–15)
Anion gap: 11 (ref 5–15)
BUN: 21 mg/dL — ABNORMAL HIGH (ref 6–20)
BUN: 21 mg/dL — ABNORMAL HIGH (ref 6–20)
CO2: 15 mmol/L — ABNORMAL LOW (ref 22–32)
CO2: 17 mmol/L — ABNORMAL LOW (ref 22–32)
Calcium: 9.1 mg/dL (ref 8.9–10.3)
Calcium: 8.6 mg/dL — ABNORMAL LOW (ref 8.9–10.3)
Chloride: 108 mmol/L (ref 98–111)
Chloride: 109 mmol/L (ref 98–111)
Creatinine, Ser: 0.97 mg/dL (ref 0.44–1.00)
Creatinine, Ser: 0.94 mg/dL (ref 0.44–1.00)
GFR, Estimated: >60 mL/min (ref >60)
GFR, Estimated: >60 mL/min (ref >60)
Glucose, Bld: 183 mg/dL — ABNORMAL HIGH (ref 70–99)
Glucose, Bld: 173 mg/dL — ABNORMAL HIGH (ref 70–99)
Potassium: 5.2 mmol/L — ABNORMAL HIGH (ref 3.5–5.1)
Potassium: 3.7 mmol/L (ref 3.5–5.1)
Sodium: 137 mmol/L (ref 135–145)
Sodium: 137 mmol/L (ref 135–145)

## 2021-05-12 LAB — CBC
HCT: 39 % (ref 36.0–46.0)
Hemoglobin: 13.1 g/dL (ref 12.0–15.0)
MCH: 30.8 pg (ref 26.0–34.0)
MCHC: 33.6 g/dL (ref 30.0–36.0)
MCV: 91.8 fL (ref 80.0–100.0)
Platelets: 421 10*3/uL — ABNORMAL HIGH (ref 150–400)
RBC: 4.25 MIL/uL (ref 3.87–5.11)
RDW: 14.8 % (ref 11.5–15.5)
WBC: 18.4 10*3/uL — ABNORMAL HIGH (ref 4.0–10.5)
nRBC: 0 % (ref 0.0–0.2)

## 2021-05-12 LAB — GLUCOSE, CAPILLARY
Glucose-Capillary: 174 mg/dL — ABNORMAL HIGH (ref 70–99)
Glucose-Capillary: 152 mg/dL — ABNORMAL HIGH (ref 70–99)
Glucose-Capillary: 164 mg/dL — ABNORMAL HIGH (ref 70–99)
Glucose-Capillary: 217 mg/dL — ABNORMAL HIGH (ref 70–99)
Glucose-Capillary: 171 mg/dL — ABNORMAL HIGH (ref 70–99)

## 2021-05-12 LAB — LACTIC ACID, PLASMA
Lactic Acid, Venous: 2.2 mmol/L (ref 0.5–1.9)
Lactic Acid, Venous: 2.1 mmol/L (ref 0.5–1.9)

## 2021-05-12 MED ORDER — ASPIRIN EC 81 MG PO TBEC
81.0000 mg | DELAYED_RELEASE_TABLET | Freq: Every day | ORAL | Status: DC
  Administered 2021-05-12 – 2021-05-17 (×6): 81 mg via ORAL
  Filled 2021-05-12 (×6): qty 1

## 2021-05-12 MED ORDER — PANTOPRAZOLE SODIUM 40 MG IV SOLR
40.0000 mg | Freq: Two times a day (BID) | INTRAVENOUS | Status: DC
  Administered 2021-05-12 – 2021-05-13 (×3): 40 mg via INTRAVENOUS
  Filled 2021-05-12 (×4): qty 10

## 2021-05-12 MED ORDER — SODIUM BICARBONATE 650 MG PO TABS
1300.0000 mg | ORAL_TABLET | Freq: Two times a day (BID) | ORAL | Status: DC
  Administered 2021-05-12 – 2021-05-13 (×3): 1300 mg via ORAL
  Filled 2021-05-12 (×4): qty 2

## 2021-05-12 MED ORDER — AMLODIPINE BESYLATE 5 MG PO TABS
2.5000 mg | ORAL_TABLET | Freq: Every day | ORAL | Status: DC
  Administered 2021-05-12 – 2021-05-13 (×2): 2.5 mg via ORAL
  Filled 2021-05-12 (×2): qty 1

## 2021-05-12 MED ORDER — SODIUM CHLORIDE 0.9 % IV BOLUS: 500.0000 mL | Freq: Once | INTRAVENOUS | Status: DC

## 2021-05-12 MED ORDER — POTASSIUM CHLORIDE IN NACL 20-0.9 MEQ/L-% IV SOLN
INTRAVENOUS | Status: DC
  Filled 2021-05-12: qty 1000

## 2021-05-12 MED ORDER — SODIUM CHLORIDE 0.9 % IV BOLUS
500.0000 mL | Freq: Once | INTRAVENOUS | Status: AC
  Administered 2021-05-12: 500 mL via INTRAVENOUS

## 2021-05-12 MED ORDER — ALUM & MAG HYDROXIDE-SIMETH 200-200-20 MG/5ML PO SUSP: 30.0000 mL | Freq: Four times a day (QID) | ORAL | Status: DC | PRN

## 2021-05-12 MED ORDER — METOCLOPRAMIDE HCL 5 MG/ML IJ SOLN
5.0000 mg | Freq: Four times a day (QID) | INTRAMUSCULAR | Status: AC
Start: 2021-05-12 — End: 2021-05-13
  Administered 2021-05-12 – 2021-05-13 (×4): 5 mg via INTRAVENOUS
  Filled 2021-05-12 (×5): qty 2

## 2021-05-12 NOTE — Progress Notes (Signed)
Pt has not felt well today. She voided once in morning shift. Unable to measure but pt stated it was only a little and dark. Md aware. Pt has been tachycardic all day 110's-120's and brief periods in 130's. Patient states that she feels better than in morning. Md made aware. Payton Emerald, RN ? ?

## 2021-05-12 NOTE — Progress Notes (Signed)
Critical lab value: lactic acid 2.2 ?Paged MD in [facility] and sent secure chat. ?Payton Emerald, RN ? ?

## 2021-05-12 NOTE — Progress Notes (Signed)
TRH night cross cover note: ? ?I was notified by RN of this patient's complaint of anxiety, with patient requesting prn Ativan. ? ?Per my chart review, this is a 51 year old female who was admitted during dayshift on 05/11/2021 for NSTEMI after presenting with complaint of chest pain.  Cardiology subsequently consulted.  ? ?This evening the patient is complaining of some anxiety, with vital signs notable for sinus tachycardia with associated heart rates in the 120s, with normotensive blood pressures, most recently 125/85.  In the setting of this anxiety, the patient is specifically requesting consideration for as needed Ativan.  Aside from anxiety, she is reportedly without any additional acute symptoms at this time, including no residual chest pain. ? ?I subsequently placed order for prn IV Ativan. ? ? ? ? ?Babs Bertin, DO ?Hospitalist ? ?

## 2021-05-12 NOTE — Progress Notes (Signed)
?  Transition of Care (TOC) Screening Note ? ? ?Patient Details  ?Name: Jennifer Hogan ?Date of Birth: 1970-07-15 ? ? ?Transition of Care (TOC) CM/SW Contact:    ?Dahlia Client, Romeo Rabon, RN ?Phone Number: ?05/12/2021, 3:25 PM ? ? ? ?Transition of Care Department [facility]) has reviewed patient and no TOC needs have been identified at this time. We will continue to monitor patient advancement through interdisciplinary progression rounds. If new patient transition needs arise, please place a TOC consult. ?  ?

## 2021-05-12 NOTE — Progress Notes (Addendum)
? ?Progress Note ? ?Patient Name: Jennifer Hogan ?Date of Encounter: 05/12/2021 ? ?[facility] Cardiologist: Jennifer Him, MD  ? ?Subjective  ? ?Still w/ severe upper L abd pain, has had since admit, no change ? ?Inpatient Medications  ?  ?Scheduled Meds: ? aspirin  325 mg Oral Daily  ? clopidogrel  75 mg Oral QHS  ? diphenhydrAMINE  25 mg Oral QHS  ? enoxaparin (LOVENOX) injection  40 mg Subcutaneous Q24H  ? insulin pump   Subcutaneous TID WC, HS, 0200  ? levothyroxine  100 mcg Oral QAC breakfast  ? metoprolol succinate  12.5 mg Oral QHS  ? metoprolol tartrate  2.5 mg Intravenous Once  ? montelukast  10 mg Oral QHS  ? sodium chloride flush  3 mL Intravenous Q12H  ? sodium chloride flush  3 mL Intravenous Q12H  ? ?Continuous Infusions: ? sodium chloride    ? cefTRIAXone (ROCEPHIN)  IV Stopped (05/12/21 0622)  ? doxycycline (VIBRAMYCIN) IV Stopped (05/11/21 2353)  ? sodium chloride Stopped (05/10/21 2302)  ? ?PRN Meds: ?sodium chloride, acetaminophen **OR** acetaminophen, albuterol, budesonide, dextrose, FLUoxetine, fluticasone, hydrOXYzine, LORazepam, morphine injection, sodium chloride flush  ? ?Vital Signs  ?  ?Vitals:  ? 05/11/21 1900 05/12/21 0016 05/12/21 0311 05/12/21 0805  ?BP: 127/84 112/74 113/79   ?Pulse: (!) 124 (!) 121 (!) 128 94  ?Resp: 19 (!) '22 19 20  '$ ?Temp: 99.6 ?F (37.6 ?C) 98.3 ?F (36.8 ?C) 99 ?F (37.2 ?C) 98.2 ?F (36.8 ?C)  ?TempSrc: Oral Oral Oral Oral  ?SpO2: 97% 97% 96% 94%  ? ? ?Intake/Output Summary (Last 24 hours) at 05/12/2021 0907 ?Last data filed at 05/12/2021 0631 ?Gross per 24 hour  ?Intake 746.04 ml  ?Output --  ?Net 746.04 ml  ? ? ?  04/27/2021  ?  3:10 PM 03/25/2021  ?  9:16 AM 03/01/2021  ?  9:02 AM  ?Last 3 Weights  ?Weight (lbs) 150 lb 138 lb 143 lb  ?Weight (kg) 68.04 kg 62.596 kg 64.864 kg  ?   ? ?Telemetry  ?  ?SR, 9 bt run NSVT - Personally Reviewed ? ?ECG  ?  ?None today - Personally Reviewed ? ?Physical Exam  ? ?GEN: No acute distress.   ?Neck: No JVD seen, difficult to assess  2nd tachycardia ?Cardiac: RRR, no murmurs, rubs, or gallops.  ?Respiratory: few rales bases bilaterally. ?GI: Soft, extremely tender, non-distended, no BS heard in upper quadrant, +BS lower quadrant ?MS: No edema; No deformity. R groin cath site w/out ecchymosis or hematioma ?Neuro:  Nonfocal  ?Psych: Normal affect  ? ?Labs  ?  ?High Sensitivity Troponin:   ?Recent Labs  ?Lab 05/10/21 ?1914 05/10/21 ?2219 05/11/21 ?0747 05/11/21 ?1004  ?TROPONINIHS 347* 979* 1,580* 1,393*  ?   ?Chemistry ?Recent Labs  ?Lab 05/10/21 ?2219 05/11/21 ?0747 05/11/21 ?1355 05/12/21 ?0233  ?NA 140 136 132* 137  ?K 4.0 4.0 4.0 5.2*  ?CL 110 106 105 108  ?CO2 18* 22  --  15*  ?GLUCOSE 62* 128* 160* 183*  ?BUN 20 23* 23* 21*  ?CREATININE 1.09* 1.09* 0.80 0.97  ?CALCIUM 9.2 9.1  --  9.1  ?PROT  --  7.3  --   --   ?ALBUMIN  --  3.9  --   --   ?AST  --  44*  --   --   ?ALT  --  19  --   --   ?ALKPHOS  --  71  --   --   ?  BILITOT  --  0.7  --   --   ?GFRNONAA >60 >60  --  >60  ?ANIONGAP 12 8  --  14  ?  ?Lipids  ?Recent Labs  ?Lab 05/11/21 ?9629  ?CHOL 258*  ?TRIG 64  ?HDL 78  ?LDLCALC 167*  ?CHOLHDL 3.3  ?  ?Hematology ?Recent Labs  ?Lab 05/10/21 ?1914 05/10/21 ?1948 05/11/21 ?5284 05/11/21 ?1355 05/12/21 ?0233  ?WBC 14.7*  --  13.5*  --  18.4*  ?RBC 4.39  --  4.04  --  4.25  ?HGB 13.3   < > 12.5 11.9* 13.1  ?HCT 40.1   < > 36.9 35.0* 39.0  ?MCV 91.3  --  91.3  --  91.8  ?MCH 30.3  --  30.9  --  30.8  ?MCHC 33.2  --  33.9  --  33.6  ?RDW 14.2  --  14.6  --  14.8  ?PLT 518*  --  449*  --  421*  ? < > = values in this interval not displayed.  ? ?Thyroid No results for input(s): TSH, FREET4 in the last 168 hours.  ?BNP ?Recent Labs  ?Lab 05/11/21 ?1324  ?BNP 2,412.6*  ?  ?DDimer No results for input(s): DDIMER in the last 168 hours.  ? ?Radiology  ?  ?DG Chest 2 View ? ?Result Date: 05/10/2021 ?CLINICAL DATA:  Chest pain. EXAM: CHEST - 2 VIEW COMPARISON:  Chest radiograph dated 01/02/2021. FINDINGS: Slightly hyperinflated lungs. No focal  consolidation, pleural effusion, or pneumothorax. Mild interstitial prominence. The cardiac silhouette is within normal limits. Median sternotomy wires and postsurgical changes of CABG. No acute osseous pathology. IMPRESSION: No focal consolidation. Mild interstitial prominence may represent edema. Atypical pneumonia is not excluded clinical correlation is recommended. Electronically Signed   By: Jennifer Hogan M.D.   On: 05/10/2021 20:22  ? ?CARDIAC CATHETERIZATION ? ?Result Date: 05/11/2021 ?  Prox LAD to Mid LAD lesion is 100% stenosed.   Dist LAD-1 lesion is 70% stenosed.   Dist LAD-2 lesion is 70% stenosed.   Mid LAD lesion is 60% stenosed.   Ost Cx to Prox Cx lesion is 99% stenosed.   Prox Cx to Mid Cx lesion is 70% stenosed.   Ramus-1 lesion is 60% stenosed.   Ramus-2 lesion is 20% stenosed.   Ramus-3 lesion is 70% stenosed.   RPAV lesion is 60% stenosed.   Non-stenotic Mid LAD to Dist LAD lesion was previously treated.   LIMA graft was visualized by angiography and is normal in caliber.   RIMA graft was visualized by non-selective angiography and is normal in caliber.   The graft exhibits no disease.   LV end diastolic pressure is moderately elevated. Severe 2 vessel obstructive CAD Patent sequential LIMA graft  to the ramus intermediate and distal LAD Patent RIMA to the distal LCx Moderately elevated LVEDP Compared to September 2022 there is no significant angiographic change. Recommend continued medical therapy.  ? ?ECHOCARDIOGRAM COMPLETE ? ?Result Date: 05/11/2021 ?   ECHOCARDIOGRAM REPORT   Patient Name:   Jennifer Hogan Date of Exam: 05/11/2021 Medical Rec #:  [id number]         Height:       65.0 in Accession #:    [id number]        Weight:       150.0 lb Date of Birth:  Mar 30, 1970        BSA:          1.750 m? Patient Age:  50 years          BP:           97/56 mmHg Patient Gender: F                 HR:           93 bpm. Exam Location:  Inpatient Procedure: 2D Echo, Cardiac Doppler, Color Doppler,  Strain Analysis and 3D Echo Indications:    CHF-Acute Systolic C12.75  History:        Patient has prior history of Echocardiogram examinations, most                 recent 12/27/2020. Idiopathic CMP, Previous Myocardial Infarction                 and CAD, Signs/Symptoms:Dyspnea; Risk Factors:Diabetes.  Sonographer:    Bernadene Person RDCS Referring Phys: [id number] RONDELL A SMITH IMPRESSIONS  1. Diffuse hypokinesis worse in septum and apex . Left ventricular ejection fraction, by estimation, is 30 to 35%. The left ventricle has moderately decreased function. The left ventricle demonstrates global hypokinesis. The left ventricular internal cavity size was severely dilated. Left ventricular diastolic parameters are indeterminate.  2. Right ventricular systolic function is normal. The right ventricular size is normal. There is normal pulmonary artery systolic pressure.  3. The mitral valve is abnormal. Mild mitral valve regurgitation. No evidence of mitral stenosis.  4. The aortic valve is tricuspid. Aortic valve regurgitation is not visualized. No aortic stenosis is present.  5. The inferior vena cava is normal in size with greater than 50% respiratory variability, suggesting right atrial pressure of 3 mmHg. FINDINGS  Left Ventricle: Diffuse hypokinesis worse in septum and apex. Left ventricular ejection fraction, by estimation, is 30 to 35%. The left ventricle has moderately decreased function. The left ventricle demonstrates global hypokinesis. The left ventricular  internal cavity size was severely dilated. There is no left ventricular hypertrophy. Left ventricular diastolic parameters are indeterminate. Right Ventricle: The right ventricular size is normal. No increase in right ventricular wall thickness. Right ventricular systolic function is normal. There is normal pulmonary artery systolic pressure. The tricuspid regurgitant velocity is 2.62 m/s, and  with an assumed right atrial pressure of 3 mmHg, the estimated  right ventricular systolic pressure is 94.4 mmHg. Left Atrium: Left atrial size was normal in size. Right Atrium: Right atrial size was normal in size. Pericardium: There is no evidence of pericardial effusion.

## 2021-05-12 NOTE — Progress Notes (Signed)
?PROGRESS NOTE ? ? ? ?Jennifer Hogan  DTO:[id number] DOB: Sep 15, 1970 DOA: 05/10/2021 ?PCP: [name], NP  ?Outpatient Specialists:  ? ? ? ?Brief Narrative:  ?Patient is a 51 year old Caucasian female past medical history significant for hypertension, hyperlipidemia, coronary artery disease status post CABG, diabetes mellitus type 1 on insulin pump, hypothyroidism and GERD.  Patient was admitted with chest pain, with associated significant GI symptoms.  Patient reported nausea and vomiting since the last 2 to 3 days.  Patient has undergone cardiac catheterization without significant finding. ? ?05/12/2021: Patient seen.  Last nausea vomiting was this morning.  Patient continues to report abdominal discomfort.  Patient is asking for some antacid.  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  We will repeat BMP stat to ensure accuracy of the lab result. ? ? ?Assessment & Plan: ?  ?Principal Problem: ?  NSTEMI (non-ST elevated myocardial infarction) ([place]) ?Active Problems: ?  Hypothyroidism ?  Type 1 diabetes mellitus ([place]) ?  SIRS (systemic inflammatory response syndrome) ([facility]) ?  CAD S/P percutaneous coronary angioplasty ?  Marijuana abuse ?  Hyperlipidemia LDL goal <70 ?  Heart failure with reduced ejection fraction ([place]) ?  Acute respiratory failure with hypoxia ([place]) ? ? ?NSTEMI CAD ?-Patient has had cardiac catheterization without significant finding. ?-Cardiology team is directing.   ?-Medical management for now.    ?  ?SIRS ?-Tmax of 99.4. ?-Heart rate has ranged from 94 to 121 bpm. ?-Patient seems uncomfortable. ?-Worsening leukocytosis.  Possibly reactive. ?-History of nausea and vomiting.  Rule out gastroparesis. ?-No cultures ordered.   ?-Patient is on IV doxycycline and Rocephin!! ?-Check urine drug screen. ?  ?Acute respiratory failure with hypoxia Heart failure with reduced EF  ?-Acute respiratory distress has resolved.   ?  ?Hypotension ?-Hypotension has  resolved. ?-Last blood pressure 129/79 mmHg  ?  ?Hyperlipidemia ?-Continue statins.   ?      ?Diabetes mellitus type 1, with hypoglycemia ?Patient on insulin pump.  On admission initial blood sugar was noted to be 62.  Last available hemoglobin A1c 8.7 on 03/25/2021.  Patient requested to manage pump. ?-Insulin pump order set utilized ?-Hypoglycemic protocols with D50 as needed for low blood sugars ?-CBGs before every meal ?  ?Hypothyroidism ?Home regimen includes levothyroxine 100 mcg daily.  Last TSH 0.737 on 03/25/2021. ?-Continue levothyroxine ?  ?Anxiety ?Medication regimen includes Prozac 40 mg daily as needed. ?-Will need to discussed that this is not a as needed medication and needs to be taken on a consistent basis to have benefit ?  ?Marijuana abuse ? ?Persistent nausea and vomiting: ?-Rule out gastroparesis. ?-IV Reglan 5 Mg every 6 hourly. ?  ? ? ?DVT prophylaxis: Lovenox 40 Mg once daily. ?Code Status: Full code ?Family Communication:  ?Disposition Plan:  ? ? ?Consultants:  ?Cardiology. ? ?Procedures:  ?Cardiac catheterization ? ?Antimicrobials:  ?IV Rocephin ?IV doxycycline ? ? ?Subjective: ?Abdominal discomfort. ? ?Objective: ?Vitals:  ? 05/12/21 0311 05/12/21 0805 05/12/21 0920 05/12/21 1244  ?BP: 113/79  123/77 129/79  ?Pulse: (!) 128 94  (!) 121  ?Resp: '19 20  15  '$ ?Temp: 99 ?F (37.2 ?C) 98.2 ?F (36.8 ?C)  99.4 ?F (37.4 ?C)  ?TempSrc: Oral Oral  Oral  ?SpO2: 96% 94%  95%  ? ? ?Intake/Output Summary (Last 24 hours) at 05/12/2021 1337 ?Last data filed at 05/12/2021 0631 ?Gross per 24 hour  ?Intake 746.04 ml  ?Output --  ?Net 746.04 ml  ? ?There were no vitals  filed for this visit. ? ?Examination: ? ?General exam: Appears to be in minimal discomfort.  Awake and alert.    ?Respiratory system: Clear to auscultation.  ?Cardiovascular system: S1 & S2  ?Gastrointestinal system: Vague abdominal discomfort.   ?Central nervous system: Alert and oriented. No focal neurological deficits. ?Extremities: No leg  edema. ? ?Data Reviewed: I have personally reviewed following labs and imaging studies ? ?CBC: ?Recent Labs  ?Lab 05/10/21 ?1914 05/10/21 ?1948 05/11/21 ?8416 05/11/21 ?1355 05/12/21 ?0233  ?WBC 14.7*  --  13.5*  --  18.4*  ?NEUTROABS 13.5*  --   --   --   --   ?HGB 13.3 13.9 12.5 11.9* 13.1  ?HCT 40.1 41.0 36.9 35.0* 39.0  ?MCV 91.3  --  91.3  --  91.8  ?PLT 518*  --  449*  --  421*  ? ?Basic Metabolic Panel: ?Recent Labs  ?Lab 05/10/21 ?1914 05/10/21 ?1948 05/10/21 ?2219 05/11/21 ?0747 05/11/21 ?1355 05/12/21 ?0233  ?NA 137 137 140 136 132* 137  ?K 3.6 3.6 4.0 4.0 4.0 5.2*  ?CL 106  --  110 106 105 108  ?CO2 16*  --  18* 22  --  15*  ?GLUCOSE 296*  --  62* 128* 160* 183*  ?BUN 18  --  20 23* 23* 21*  ?CREATININE 1.09*  --  1.09* 1.09* 0.80 0.97  ?CALCIUM 9.1  --  9.2 9.1  --  9.1  ? ?GFR: ?CrCl cannot be calculated (Unknown ideal weight.). ?Liver Function Tests: ?Recent Labs  ?Lab 05/11/21 ?6063  ?AST 44*  ?ALT 19  ?ALKPHOS 71  ?BILITOT 0.7  ?PROT 7.3  ?ALBUMIN 3.9  ? ?Recent Labs  ?Lab 05/11/21 ?0160  ?LIPASE 22  ? ?No results for input(s): AMMONIA in the last 168 hours. ?Coagulation Profile: ?No results for input(s): INR, PROTIME in the last 168 hours. ?Cardiac Enzymes: ?No results for input(s): CKTOTAL, CKMB, CKMBINDEX, TROPONINI in the last 168 hours. ?BNP (last 3 results) ?No results for input(s): PROBNP in the last 8760 hours. ?HbA1C: ?No results for input(s): HGBA1C in the last 72 hours. ?CBG: ?Recent Labs  ?Lab 05/11/21 ?1701 05/11/21 ?2158 05/12/21 ?0309 05/12/21 ?1093 05/12/21 ?1242  ?GLUCAP 212* 91 174* 152* 164*  ? ?Lipid Profile: ?Recent Labs  ?  05/11/21 ?0747  ?CHOL 258*  ?HDL 78  ?LDLCALC 167*  ?TRIG 64  ?CHOLHDL 3.3  ? ?Thyroid Function Tests: ?No results for input(s): TSH, T4TOTAL, FREET4, T3FREE, THYROIDAB in the last 72 hours. ?Anemia Panel: ?No results for input(s): VITAMINB12, FOLATE, FERRITIN, TIBC, IRON, RETICCTPCT in the last 72 hours. ?Urine analysis: ?   ?Component Value Date/Time  ?  COLORURINE YELLOW 05/11/2021 1014  ? APPEARANCEUR HAZY (A) 05/11/2021 1014  ? LABSPEC 1.020 05/11/2021 1014  ? PHURINE 5.0 05/11/2021 1014  ? GLUCOSEU >=500 (A) 05/11/2021 1014  ? [place] NEGATIVE 11/23/2015 0831  ? HGBUR NEGATIVE 05/11/2021 1014  ? [place] NEGATIVE 05/11/2021 1014  ? BILIRUBINUR neg 12/07/2015 0843  ? KETONESUR 20 (A) 05/11/2021 1014  ? PROTEINUR 30 (A) 05/11/2021 1014  ? UROBILINOGEN 1.0 12/07/2015 0843  ? UROBILINOGEN 0.2 11/23/2015 0831  ? NITRITE NEGATIVE 05/11/2021 1014  ? LEUKOCYTESUR NEGATIVE 05/11/2021 1014  ? ?Sepsis Labs: ?'@LABRCNTIP'$ (procalcitonin:4,lacticidven:4) ? ?)No results found for this or any previous visit (from the past 240 hour(s)).  ? ? ? ? ? ?Radiology Studies: ?DG Chest 2 View ? ?Result Date: 05/10/2021 ?CLINICAL DATA:  Chest pain. EXAM: CHEST - 2 VIEW COMPARISON:  Chest radiograph dated 01/02/2021. FINDINGS:  Slightly hyperinflated lungs. No focal consolidation, pleural effusion, or pneumothorax. Mild interstitial prominence. The cardiac silhouette is within normal limits. Median sternotomy wires and postsurgical changes of CABG. No acute osseous pathology. IMPRESSION: No focal consolidation. Mild interstitial prominence may represent edema. Atypical pneumonia is not excluded clinical correlation is recommended. Electronically Signed   By: Anner Crete M.D.   On: 05/10/2021 20:22  ? ?CARDIAC CATHETERIZATION ? ?Result Date: 05/11/2021 ?  Prox LAD to Mid LAD lesion is 100% stenosed.   Dist LAD-1 lesion is 70% stenosed.   Dist LAD-2 lesion is 70% stenosed.   Mid LAD lesion is 60% stenosed.   Ost Cx to Prox Cx lesion is 99% stenosed.   Prox Cx to Mid Cx lesion is 70% stenosed.   Ramus-1 lesion is 60% stenosed.   Ramus-2 lesion is 20% stenosed.   Ramus-3 lesion is 70% stenosed.   RPAV lesion is 60% stenosed.   Non-stenotic Mid LAD to Dist LAD lesion was previously treated.   LIMA graft was visualized by angiography and is normal in caliber.   RIMA graft was visualized by  non-selective angiography and is normal in caliber.   The graft exhibits no disease.   LV end diastolic pressure is moderately elevated. Severe 2 vessel obstructive CAD Patent sequential LIMA graft  to the ramus intermediate a

## 2021-05-12 NOTE — Progress Notes (Signed)
Dr. Jalene Mullet of ophthalmology, would like for the attending physician to call as soon as they can. He recently did eye surgery on the patient and would like to know what her status is.  His number is [phone]. ?

## 2021-05-13 ENCOUNTER — Inpatient Hospital Stay (HOSPITAL_COMMUNITY): Payer: Self-pay

## 2021-05-13 DIAGNOSIS — E1110 Type 2 diabetes mellitus with ketoacidosis without coma: Secondary | ICD-10-CM

## 2021-05-13 LAB — BRAIN NATRIURETIC PEPTIDE: B Natriuretic Peptide: 2640.5 pg/mL — ABNORMAL HIGH (ref 0.0–100.0)

## 2021-05-13 LAB — CBC WITH DIFFERENTIAL/PLATELET
Abs Immature Granulocytes: 0.07 10*3/uL (ref 0.00–0.07)
Basophils Absolute: 0 10*3/uL (ref 0.0–0.1)
Basophils Relative: 0 %
Eosinophils Absolute: 0 10*3/uL (ref 0.0–0.5)
Eosinophils Relative: 0 %
HCT: 36 % (ref 36.0–46.0)
Hemoglobin: 12.1 g/dL (ref 12.0–15.0)
Immature Granulocytes: 1 %
Lymphocytes Relative: 5 %
Lymphs Abs: 0.7 10*3/uL (ref 0.7–4.0)
MCH: 30.6 pg (ref 26.0–34.0)
MCHC: 33.6 g/dL (ref 30.0–36.0)
MCV: 91.1 fL (ref 80.0–100.0)
Monocytes Absolute: 0.4 10*3/uL (ref 0.1–1.0)
Monocytes Relative: 4 %
Neutro Abs: 10.9 10*3/uL — ABNORMAL HIGH (ref 1.7–7.7)
Neutrophils Relative %: 90 %
Platelets: 362 10*3/uL (ref 150–400)
RBC: 3.95 MIL/uL (ref 3.87–5.11)
RDW: 14.9 % (ref 11.5–15.5)
WBC: 12.1 10*3/uL — ABNORMAL HIGH (ref 4.0–10.5)
nRBC: 0 % (ref 0.0–0.2)

## 2021-05-13 LAB — BASIC METABOLIC PANEL
Anion gap: 10 (ref 5–15)
Anion gap: 10 (ref 5–15)
Anion gap: 9 (ref 5–15)
BUN: 19 mg/dL (ref 6–20)
BUN: 15 mg/dL (ref 6–20)
BUN: 15 mg/dL (ref 6–20)
CO2: 20 mmol/L — ABNORMAL LOW (ref 22–32)
CO2: 20 mmol/L — ABNORMAL LOW (ref 22–32)
CO2: 20 mmol/L — ABNORMAL LOW (ref 22–32)
Calcium: 8 mg/dL — ABNORMAL LOW (ref 8.9–10.3)
Calcium: 8.5 mg/dL — ABNORMAL LOW (ref 8.9–10.3)
Calcium: 8.2 mg/dL — ABNORMAL LOW (ref 8.9–10.3)
Chloride: 107 mmol/L (ref 98–111)
Chloride: 108 mmol/L (ref 98–111)
Chloride: 108 mmol/L (ref 98–111)
Creatinine, Ser: 0.93 mg/dL (ref 0.44–1.00)
Creatinine, Ser: 0.97 mg/dL (ref 0.44–1.00)
Creatinine, Ser: 0.95 mg/dL (ref 0.44–1.00)
GFR, Estimated: >60 mL/min (ref >60)
GFR, Estimated: >60 mL/min (ref >60)
GFR, Estimated: >60 mL/min (ref >60)
Glucose, Bld: 199 mg/dL — ABNORMAL HIGH (ref 70–99)
Glucose, Bld: 98 mg/dL (ref 70–99)
Glucose, Bld: 133 mg/dL — ABNORMAL HIGH (ref 70–99)
Potassium: 3.3 mmol/L — ABNORMAL LOW (ref 3.5–5.1)
Potassium: 3.1 mmol/L — ABNORMAL LOW (ref 3.5–5.1)
Potassium: 3.4 mmol/L — ABNORMAL LOW (ref 3.5–5.1)
Sodium: 137 mmol/L (ref 135–145)
Sodium: 138 mmol/L (ref 135–145)
Sodium: 137 mmol/L (ref 135–145)

## 2021-05-13 LAB — RAPID URINE DRUG SCREEN, HOSP PERFORMED
Amphetamines: NOT DETECTED
Barbiturates: NOT DETECTED
Benzodiazepines: POSITIVE — AB
Cocaine: NOT DETECTED
Opiates: POSITIVE — AB
Tetrahydrocannabinol: POSITIVE — AB

## 2021-05-13 LAB — GLUCOSE, CAPILLARY
Glucose-Capillary: 179 mg/dL — ABNORMAL HIGH (ref 70–99)
Glucose-Capillary: 271 mg/dL — ABNORMAL HIGH (ref 70–99)
Glucose-Capillary: 275 mg/dL — ABNORMAL HIGH (ref 70–99)
Glucose-Capillary: 201 mg/dL — ABNORMAL HIGH (ref 70–99)
Glucose-Capillary: 140 mg/dL — ABNORMAL HIGH (ref 70–99)
Glucose-Capillary: 161 mg/dL — ABNORMAL HIGH (ref 70–99)
Glucose-Capillary: 110 mg/dL — ABNORMAL HIGH (ref 70–99)
Glucose-Capillary: 130 mg/dL — ABNORMAL HIGH (ref 70–99)
Glucose-Capillary: 149 mg/dL — ABNORMAL HIGH (ref 70–99)
Glucose-Capillary: 149 mg/dL — ABNORMAL HIGH (ref 70–99)
Glucose-Capillary: 139 mg/dL — ABNORMAL HIGH (ref 70–99)
Glucose-Capillary: 94 mg/dL (ref 70–99)

## 2021-05-13 LAB — LACTIC ACID, PLASMA: Lactic Acid, Venous: 1.6 mmol/L (ref 0.5–1.9)

## 2021-05-13 LAB — RENAL FUNCTION PANEL
Albumin: 3 g/dL — ABNORMAL LOW (ref 3.5–5.0)
Anion gap: 14 (ref 5–15)
BUN: 20 mg/dL (ref 6–20)
CO2: 13 mmol/L — ABNORMAL LOW (ref 22–32)
Calcium: 8.3 mg/dL — ABNORMAL LOW (ref 8.9–10.3)
Chloride: 109 mmol/L (ref 98–111)
Creatinine, Ser: 1.08 mg/dL — ABNORMAL HIGH (ref 0.44–1.00)
GFR, Estimated: >60 mL/min (ref >60)
Glucose, Bld: 231 mg/dL — ABNORMAL HIGH (ref 70–99)
Phosphorus: 1.7 mg/dL — ABNORMAL LOW (ref 2.5–4.6)
Potassium: 3.8 mmol/L (ref 3.5–5.1)
Sodium: 136 mmol/L (ref 135–145)

## 2021-05-13 LAB — BETA-HYDROXYBUTYRIC ACID
Beta-Hydroxybutyric Acid: 3.07 mmol/L — ABNORMAL HIGH (ref 0.05–0.27)
Beta-Hydroxybutyric Acid: 0.47 mmol/L — ABNORMAL HIGH (ref 0.05–0.27)

## 2021-05-13 LAB — BLOOD GAS, ARTERIAL
Acid-base deficit: 7.6 mmol/L — ABNORMAL HIGH (ref 0.0–2.0)
Bicarbonate: 14.6 mmol/L — ABNORMAL LOW (ref 20.0–28.0)
Drawn by: 275531
O2 Saturation: 99.5 %
Patient temperature: 37
pCO2 arterial: 22 mmHg — ABNORMAL LOW (ref 32–48)
pH, Arterial: 7.43 (ref 7.35–7.45)
pO2, Arterial: 101 mmHg (ref 83–108)

## 2021-05-13 LAB — CORTISOL: Cortisol, Plasma: 31 ug/dL

## 2021-05-13 LAB — PROCALCITONIN: Procalcitonin: 1.91 ng/mL

## 2021-05-13 LAB — MAGNESIUM: Magnesium: 1.9 mg/dL (ref 1.7–2.4)

## 2021-05-13 MED ORDER — PHENYLEPHRINE HCL-NACL 20-0.9 MG/250ML-% IV SOLN
25.0000 ug/min | INTRAVENOUS | Status: DC
  Administered 2021-05-13: 50 ug/min via INTRAVENOUS
  Administered 2021-05-13: 25 ug/min via INTRAVENOUS
  Administered 2021-05-14: 50 ug/min via INTRAVENOUS
  Filled 2021-05-13 (×3): qty 250

## 2021-05-13 MED ORDER — DEXTROSE IN LACTATED RINGERS 5 % IV SOLN: INTRAVENOUS | Status: DC

## 2021-05-13 MED ORDER — HYDROXYZINE HCL 25 MG PO TABS
25.0000 mg | ORAL_TABLET | Freq: Three times a day (TID) | ORAL | Status: DC | PRN
  Administered 2021-05-13 – 2021-05-15 (×3): 25 mg via ORAL
  Filled 2021-05-13 (×3): qty 1

## 2021-05-13 MED ORDER — SODIUM BICARBONATE 8.4 % IV SOLN: 25.0000 meq | Freq: Once | INTRAVENOUS | Status: AC

## 2021-05-13 MED ORDER — METOPROLOL SUCCINATE ER 25 MG PO TB24
12.5000 mg | ORAL_TABLET | Freq: Once | ORAL | Status: AC
  Administered 2021-05-13: 12.5 mg via ORAL
  Filled 2021-05-13: qty 1

## 2021-05-13 MED ORDER — CHLORHEXIDINE GLUCONATE CLOTH 2 % EX PADS
6.0000 | MEDICATED_PAD | Freq: Every day | CUTANEOUS | Status: DC
  Administered 2021-05-13 – 2021-05-16 (×4): 6 via TOPICAL

## 2021-05-13 MED ORDER — POTASSIUM CHLORIDE 10 MEQ/100ML IV SOLN
10.0000 meq | INTRAVENOUS | Status: AC
  Administered 2021-05-13 (×2): 10 meq via INTRAVENOUS
  Filled 2021-05-13 (×2): qty 100

## 2021-05-13 MED ORDER — LACTATED RINGERS IV BOLUS
1000.0000 mL | Freq: Once | INTRAVENOUS | Status: AC
  Administered 2021-05-13: 1000 mL via INTRAVENOUS

## 2021-05-13 MED ORDER — POTASSIUM CHLORIDE 10 MEQ/100ML IV SOLN
10.0000 meq | INTRAVENOUS | Status: AC
  Administered 2021-05-13 (×4): 10 meq via INTRAVENOUS
  Filled 2021-05-13 (×2): qty 100

## 2021-05-13 MED ORDER — POTASSIUM CHLORIDE 10 MEQ/100ML IV SOLN
INTRAVENOUS | Status: AC
  Filled 2021-05-13: qty 200

## 2021-05-13 MED ORDER — LACTATED RINGERS IV SOLN: INTRAVENOUS | Status: DC

## 2021-05-13 MED ORDER — DEXTROSE 50 % IV SOLN: 0.0000 mL | INTRAVENOUS | Status: DC | PRN

## 2021-05-13 MED ORDER — SODIUM BICARBONATE 8.4 % IV SOLN
INTRAVENOUS | Status: AC
  Administered 2021-05-13: 25 meq via INTRAVENOUS
  Filled 2021-05-13: qty 50

## 2021-05-13 MED ORDER — STERILE WATER FOR INJECTION IV SOLN
INTRAMUSCULAR | Status: DC
  Filled 2021-05-13: qty 1000

## 2021-05-13 MED ORDER — INSULIN REGULAR(HUMAN) IN NACL 100-0.9 UT/100ML-% IV SOLN
INTRAVENOUS | Status: DC
  Administered 2021-05-13: 3.4 [IU]/h via INTRAVENOUS
  Filled 2021-05-13: qty 100

## 2021-05-13 MED ORDER — SODIUM CHLORIDE 0.9 % IV SOLN
250.0000 mL | INTRAVENOUS | Status: DC
  Administered 2021-05-13: 250 mL via INTRAVENOUS

## 2021-05-13 NOTE — Progress Notes (Addendum)
? ?Progress Note ? ?Patient Name: Jennifer Hogan ?Date of Encounter: 05/13/2021 ? ?[facility] Cardiologist: Fransico Him, MD  ? ?Subjective  ? ?Abd pain is better, feels that she cannot get out of bed because of getting a dose of metop XL 12.5 mg this am, she only takes it at night, got it last pm. ?Was able to eat clear liquid breakfast ? ?Inpatient Medications  ?  ?Scheduled Meds: ? amLODipine  2.5 mg Oral Daily  ? aspirin EC  81 mg Oral Daily  ? clopidogrel  75 mg Oral QHS  ? diphenhydrAMINE  25 mg Oral QHS  ? enoxaparin (LOVENOX) injection  40 mg Subcutaneous Q24H  ? insulin pump   Subcutaneous TID WC, HS, 0200  ? levothyroxine  100 mcg Oral QAC breakfast  ? metoCLOPramide (REGLAN) injection  5 mg Intravenous Q6H  ? metoprolol succinate  12.5 mg Oral QHS  ? metoprolol tartrate  2.5 mg Intravenous Once  ? montelukast  10 mg Oral QHS  ? pantoprazole (PROTONIX) IV  40 mg Intravenous Q12H  ? sodium bicarbonate  1,300 mg Oral BID  ? sodium chloride flush  3 mL Intravenous Q12H  ? sodium chloride flush  3 mL Intravenous Q12H  ? ?Continuous Infusions: ? sodium chloride    ? cefTRIAXone (ROCEPHIN)  IV 2 g (05/13/21 0757)  ? doxycycline (VIBRAMYCIN) IV 100 mg (05/12/21 2318)  ? sodium chloride Stopped (05/10/21 2302)  ? ?PRN Meds: ?sodium chloride, acetaminophen **OR** acetaminophen, albuterol, alum & mag hydroxide-simeth, budesonide, dextrose, FLUoxetine, fluticasone, hydrOXYzine, LORazepam, morphine injection, sodium chloride flush  ? ?Vital Signs  ?  ?Vitals:  ? 05/13/21 0118 05/13/21 7371 05/13/21 0502 05/13/21 0756  ?BP: 108/63 (!) 83/59 124/78 129/77  ?Pulse: (!) 131 (!) 106 (!) 135 (!) 127  ?Resp: (!) 32 (!) 32 20 20  ?Temp: 99.5 ?F (37.5 ?C) 99 ?F (37.2 ?C) 98.3 ?F (36.8 ?C) 98.1 ?F (36.7 ?C)  ?TempSrc: Oral Oral Oral Oral  ?SpO2: 90% 93% 97% 90%  ?Weight:   63.8 kg   ? ? ?Intake/Output Summary (Last 24 hours) at 05/13/2021 0906 ?Last data filed at 05/13/2021 0600 ?Gross per 24 hour  ?Intake 1100 ml  ?Output  --  ?Net 1100 ml  ? ? ?  05/13/2021  ?  5:02 AM 04/27/2021  ?  3:10 PM 03/25/2021  ?  9:16 AM  ?Last 3 Weights  ?Weight (lbs) 140 lb 10.5 oz 150 lb 138 lb  ?Weight (kg) 63.8 kg 68.04 kg 62.596 kg  ?   ? ?Telemetry  ?  ?ST, HR sustained in the 140s until BB given, PVCs - Personally Reviewed ? ?ECG  ?  ?ST, HR 122, mild Inf ST changes and lateral T wave changes from 04/19, unclear significance - Personally Reviewed ? ?Physical Exam  ? ?GEN: generally appears miserable   ?Neck: No JVD ?Cardiac: RRR, no murmur, no rubs, or gallops.  ?Respiratory: anterior coarse rales, less in the bases ?GI: Soft, nontender, non-distended  ?MS: No edema; No deformity. ?Neuro:  Nonfocal  ?Psych: Normal affect  ? ?Labs  ?  ?High Sensitivity Troponin:   ?Recent Labs  ?Lab 05/10/21 ?1914 05/10/21 ?2219 05/11/21 ?0747 05/11/21 ?1004  ?TROPONINIHS 347* 979* 1,580* 1,393*  ?   ?Chemistry ?Recent Labs  ?Lab 05/11/21 ?0626 05/11/21 ?1355 05/12/21 ?0233 05/12/21 ?1332 05/13/21 ?0202  ?NA 136   < > 137 137 136  ?K 4.0   < > 5.2* 3.7 3.8  ?CL 106   < >  108 109 109  ?CO2 22  --  15* 17* 13*  ?GLUCOSE 128*   < > 183* 173* 231*  ?BUN 23*   < > 21* 21* 20  ?CREATININE 1.09*   < > 0.97 0.94 1.08*  ?CALCIUM 9.1  --  9.1 8.6* 8.3*  ?MG  --   --   --   --  1.9  ?PROT 7.3  --   --   --   --   ?ALBUMIN 3.9  --   --   --  3.0*  ?AST 44*  --   --   --   --   ?ALT 19  --   --   --   --   ?ALKPHOS 71  --   --   --   --   ?BILITOT 0.7  --   --   --   --   ?GFRNONAA >60  --  >60 >60 >60  ?ANIONGAP 8  --  '14 11 14  '$ ? < > = values in this interval not displayed.  ?  ?Lipids  ?Recent Labs  ?Lab 05/11/21 ?2119  ?CHOL 258*  ?TRIG 64  ?HDL 78  ?LDLCALC 167*  ?CHOLHDL 3.3  ?  ?Hematology ?Recent Labs  ?Lab 05/11/21 ?4174 05/11/21 ?1355 05/12/21 ?0233 05/13/21 ?0202  ?WBC 13.5*  --  18.4* 12.1*  ?RBC 4.04  --  4.25 3.95  ?HGB 12.5 11.9* 13.1 12.1  ?HCT 36.9 35.0* 39.0 36.0  ?MCV 91.3  --  91.8 91.1  ?MCH 30.9  --  30.8 30.6  ?MCHC 33.9  --  33.6 33.6  ?RDW 14.6  --  14.8 14.9   ?PLT 449*  --  421* 362  ? ?Thyroid No results for input(s): TSH, FREET4 in the last 168 hours.  ?BNP ?Recent Labs  ?Lab 05/11/21 ?0814 05/13/21 ?0202  ?BNP 2,412.6* 2,640.5*  ?  ?DDimer No results for input(s): DDIMER in the last 168 hours.  ? ?Radiology  ?  ?CARDIAC CATHETERIZATION ? ?Result Date: 05/11/2021 ?  Prox LAD to Mid LAD lesion is 100% stenosed.   Dist LAD-1 lesion is 70% stenosed.   Dist LAD-2 lesion is 70% stenosed.   Mid LAD lesion is 60% stenosed.   Ost Cx to Prox Cx lesion is 99% stenosed.   Prox Cx to Mid Cx lesion is 70% stenosed.   Ramus-1 lesion is 60% stenosed.   Ramus-2 lesion is 20% stenosed.   Ramus-3 lesion is 70% stenosed.   RPAV lesion is 60% stenosed.   Non-stenotic Mid LAD to Dist LAD lesion was previously treated.   LIMA graft was visualized by angiography and is normal in caliber.   RIMA graft was visualized by non-selective angiography and is normal in caliber.   The graft exhibits no disease.   LV end diastolic pressure is moderately elevated. Severe 2 vessel obstructive CAD Patent sequential LIMA graft  to the ramus intermediate and distal LAD Patent RIMA to the distal LCx Moderately elevated LVEDP Compared to September 2022 there is no significant angiographic change. Recommend continued medical therapy.  ? ?DG CHEST PORT 1 VIEW ? ?Result Date: 05/13/2021 ?CLINICAL DATA:  Encounter for hypoxia. EXAM: PORTABLE CHEST 1 VIEW COMPARISON:  PA Lat 05/10/2021. FINDINGS: The cardiac size is normal with old CABG changes, intact sternotomy sutures. The central vasculature appears normal in caliber. The lungs hyperinflated as before. Chronic interstitial changes are again noted in the lower lung fields. Since 05/10/2021 there is developed dense perihilar consolidation in  the right upper lobe with additional subpleural consolidation in the right apex and additional less dense airspace disease in the right infrahilar/medial basal area and peripheral left upper to mid lung field. Given the short  interval time frame of the appearance of this, this is probably either due to widespread pneumonia or noncardiogenic pulmonary edema with asymmetries. Elsewhere the lungs are clear. No significant pleural effusion is seen. No acute osseous findings. IMPRESSION: Interval new multifocal lung opacities, densest and most confluent in the medial right upper lobe, findings most likely either due to multilobar pneumonia or noncardiogenic pulmonary edema with asymmetry Clinical correlation and radiographic follow-up recommended. Background COPD. Electronically Signed   By: Telford Nab M.D.   On: 05/13/2021 02:45  ? ?ECHOCARDIOGRAM COMPLETE ? ?Result Date: 05/11/2021 ?   ECHOCARDIOGRAM REPORT   Patient Name:   [name] Date of Exam: 05/11/2021 Medical Rec #:  [id number]         Height:       65.0 in Accession #:    [id number]        Weight:       150.0 lb Date of Birth:  1971-01-13        BSA:          1.750 m? Patient Age:    51 years          BP:           97/56 mmHg Patient Gender: F                 HR:           93 bpm. Exam Location:  Inpatient Procedure: 2D Echo, Cardiac Doppler, Color Doppler, Strain Analysis and 3D Echo Indications:    CHF-Acute Systolic W23.76  History:        Patient has prior history of Echocardiogram examinations, most                 recent 12/27/2020. Idiopathic CMP, Previous Myocardial Infarction                 and CAD, Signs/Symptoms:Dyspnea; Risk Factors:Diabetes.  Sonographer:    Bernadene Person RDCS Referring Phys: [id number] RONDELL A SMITH IMPRESSIONS  1. Diffuse hypokinesis worse in septum and apex . Left ventricular ejection fraction, by estimation, is 30 to 35%. The left ventricle has moderately decreased function. The left ventricle demonstrates global hypokinesis. The left ventricular internal cavity size was severely dilated. Left ventricular diastolic parameters are indeterminate.  2. Right ventricular systolic function is normal. The right ventricular size is normal. There  is normal pulmonary artery systolic pressure.  3. The mitral valve is abnormal. Mild mitral valve regurgitation. No evidence of mitral stenosis.  4. The aortic valve is tricuspid. Aortic valve regurg

## 2021-05-13 NOTE — Progress Notes (Signed)

## 2021-05-13 NOTE — Progress Notes (Signed)
Initial Nutrition Assessment ? ?DOCUMENTATION CODES:  ?Not applicable ? ?INTERVENTION:  ?Recommend diet advancement ? ?NUTRITION DIAGNOSIS:  ?Inadequate oral intake related to nausea as evidenced by per patient/family report. ? ?GOAL:  ?Patient will meet greater than or equal to 90% of their needs ? ?MONITOR:  ?Diet advancement, Labs, Weight trends ? ?REASON FOR ASSESSMENT:  ?Consult ?Assessment of nutrition requirement/status ? ?ASSESSMENT:  ?Pt with PMH significant for HTN, HLD, CAD s/p CABG, type 1 DM on insulin pump, hypothyroidism, and GERD admitted with NSTEMI ? ?4/21 tx to ICU ? ?Pt has had cardiac catheterization without significant finding.  ? ?Discussed pt with RN. Pt unavailable at time of RD visit. Per H&P, pt reports N/V x2-3 days PTA but states last N/V was yesterday morning. Continues to c/o abdominal discomfort.  ? ?Weight history reviewed. No significant weight changes noted.  ? ?PO Intake: 0% x 3 recorded meals   ? ?UOP: 1x unmeasured occurrence x24 hours ?I/O: +3368m since admit ? ?Medications: reglan, IV abx, IV KCl Q1H (end time 1614) ?IVF: D5 in LR @ 765mhr  ?Drips: myxredlin, neo-synephrine ? ?Labs: ?Recent Labs  ?Lab 05/12/21 ?0233 05/12/21 ?1332 05/13/21 ?0202  ?NA 137 137 136  ?K 5.2* 3.7 3.8  ?CL 108 109 109  ?CO2 15* 17* 13*  ?BUN 21* 21* 20  ?CREATININE 0.97 0.94 1.08*  ?CALCIUM 9.1 8.6* 8.3*  ?MG  --   --  1.9  ?PHOS  --   --  1.7*  ?GLUCOSE 183* 173* 231*  ?Ionized Calcium 1.12 ?CBGs: 171-275 x24 hours (Diabetes Coordinator consult pending) ?Anion gap 14 ?A1c (03/25/21)  8.7 ? ?NUTRITION - FOCUSED PHYSICAL EXAM: ?Unable to perform at this time. Will attempt at follow-up.  ? ?Diet Order:   ?Diet Order   ? ?       ?  Diet NPO time specified  Diet effective now       ?  ? ?  ?  ? ?  ? ?EDUCATION NEEDS:  ?Not appropriate for education at this time ? ?Skin:  Skin Assessment: Skin Integrity Issues: ?Skin Integrity Issues:: Incisions ? ?Last BM:  4/18 ? ?Height:  ?Ht Readings from Last 1  Encounters:  ?04/27/21 '5\' 5"'$  (1.651 m)  ? ?Weight:  ?Wt Readings from Last 1 Encounters:  ?05/13/21 63.8 kg  ? ?BMI:  Body mass index is 23.41 kg/m?. ? ?Estimated Nutritional Needs:  ?Kcal:  1700-1900 ?Protein:  85-95 grams ?Fluid:  >1.7L ? ? ? ?AmTheone Stanley MS, RD, LDN (she/her/hers) ?RD pager number and weekend/on-call pager number located in [facility]?

## 2021-05-13 NOTE — Progress Notes (Signed)
TR[facility] night cross cover note: ? ?I was notified by RN of the patient's persistent sinus tachycardia, with heart rates in the 120s to 130s as well as her persistent tachypnea, with respiratory rates in the 20s to low 30s.  vital signs appear similar to those seen throughout the preceding day shift, but with potential slight subtle interval trend up in both heart rate and respiratory rate over the last few hours.  Review of additional vital signs notable for normotensive blood pressure 120/81 as well as oxygen saturation 96% on room air. ? ?Per my chart review, including review of most recent rounding hospitalist progress note, this is a 51 year old female with history of coronary artery disease status post CABG, chronic systolic heart failure, who was admitted on the morning of 05/11/2021 with NSTEMI, subsequently undergoing left-sided heart cath, and ensuing recommendation for medical management in the absence of percutaneous intervention.   ? ?Ensuing echocardiogram on 05/11/2021 demonstrated interval decline in left ventricular systolic function, noting updated LVEF to be 30 to 35%.  Per review of most recent cardiology progress note, there is consideration for modification of existing beta-blocker regimen, with options including up-titration of existing metoprolol succinate 12.5 mg p.o. daily versus conversion to Coreg. not currently on any scheduled diuretic medications in the context of recent acute GI losses in the setting of suspected acute GI illness.  In terms of IV fluid resuscitative efforts, the patient received a 500 cc normal saline bolus at 1830 on 05/12/2021 followed by initiation of continuous NS with 20 mEq/L potassium chloride at 75 cc/hr.  ? ?Additionally, in the setting of worsening leukocytosis, persistent tachycardia, there was some concern for underlying pneumonia, prompting empiric initiation of CAP coverage via Rocephin and doxycycline by hospitalist date of service.  ? ?Later in this evening's  shift, patient complaining of some mild palpitations, and O2 sat noted to decrease slightly, most recently 89% on room air, with ensuing improvement into the mid 90s on 2 L nasal cannula. ? ?In this patient with chronic systolic heart failure with LVEF 30 to 35% with slight interval worsening in sinus tachycardia as well as tachypnea, and slight worsening in O2 sats, I asked that patient's continuous IV fluids be held for now, and ordered chest x-ray.  This updated chest x-ray, in comparison to most recent prior from 05/10/2021 shows interval development of multifocal lung opacities, densest and most confluent in the medial right upper lobe, suggestive, per radiology read, of either multilobular pneumonia versus noncardiogenic pulmonary edema, without evidence of significant pleural effusion and no evidence of pneumothorax. I have ordered procalcitonin level and BNP to further evaluate. Serum mag level and bmp results pending. ? ?Additionally, in the absence of overt evidence of acutely decompensated heart failure, and in the context of aforementioned cardiology plan for optimization of beta-blocker regimen, I placed an order for an additional dose of metoprolol succinate 12.5 mg p.o. x1 now, with additional plan regarding ensuing AV nodal blocking regimen per day service.  ? ? ? ?Babs Bertin, DO ?Hospitalist ? ?

## 2021-05-13 NOTE — Significant Event (Signed)
Rapid Response Event Note  ? ?Reason for Call :  ?hypotension ? ?Initial Focused Assessment:  ?She is alert and talkative sitting up in bed.  She denies any pain currently ?Lung sounds ?Heart tones ? ?BP  68/43 SR 91  RR 22  O2 sat 98% on 2L [place] ? ?PCXR and ABG done prior to arrival ? ? ?Interventions:  ?IV team at bedside, started 2 PIVs ?Labs drawn ?500 cc NS bolus ?CCM consulted: Dr Lucile Shutters and Eddie Dibbles NP at bedside to assess patient ?1L LR bolus ?1/2 amp Sodium Bicarb ?Neo synephrine gtt started at 25 mcg ?Titrated to 27mg ? ? ?Transferred to ICU ? ?Plan of Care:  ? ? ? ?Event Summary:  ? ?MD Notified:  [place] at bedside ?Call Time: 1119 ?Arrival Time: 15038?End Time: 1300 ? ?LRaliegh Ip RN ?

## 2021-05-13 NOTE — Progress Notes (Signed)
Notified on-call physician of patient complaint of heart racing and a request for medication to slow heart rate. Patient's heart rate has varied throughout the night from low 100s to upper 130s. ?Will continue to monitor. ?

## 2021-05-13 NOTE — Progress Notes (Signed)
?PROGRESS NOTE ? ? ? ?Jennifer Hogan  MHW:[id number] DOB: 1970/11/10 DOA: 05/10/2021 ?PCP: [name], NP  ?Outpatient Specialists:  ? ? ? ?Brief Narrative:  ?Patient is a 51 year old Caucasian female past medical history significant for hypertension, hyperlipidemia, coronary artery disease status post CABG, diabetes mellitus type 1 on insulin pump, hypothyroidism and GERD.  Patient was admitted with chest pain, with associated significant GI symptoms.  Patient reported nausea and vomiting since the last 2 to 3 days.  Patient has undergone cardiac catheterization without significant finding. ? ?05/12/2021: Patient seen.  Last nausea vomiting was this morning.  Patient continues to report abdominal discomfort.  Patient is asking for some antacid.  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  We will repeat BMP stat to ensure accuracy of the lab result. ? ?05/13/2021: Patient remains tachycardic overnight necessitating 12.5 mg of beta-blocker.  Patient was also given Norvasc 2.5 Mg p.o. x1 dose.  Patient has been hypotensive, with systolic blood pressure in the 60s.  Patient is more acidotic.  Chest x-ray revealed widespread patchy infiltrate, worrisome for pneumonia, pulmonary edema possibly noncardiac.  Low EF is noted.  Patient has had abdominal symptoms with nausea and vomiting.  Leukocytosis is improving.  Patient is on IV ceftriaxone and doxycycline.  CO2 was just last 13 days morning.  Stat ABG revealed pH of 7.43, PCO2 of 22 and PO2 of 101 (suggestive of metabolic acidosis with possible respiratory alkalosis).  Patient was seen emergently.  IV bicarb given.  Start patient on bicarb drip.  We will panculture patient.  ICU team consulted to assume care.  Patient will be transferred to the ICU team for further management.  Worsening renal function noted this morning.  No documented fever. ? ? ?Assessment & Plan: ?  ?Principal Problem: ?  NSTEMI (non-ST elevated myocardial  infarction) ([place]) ?Active Problems: ?  Hypothyroidism ?  Type 1 diabetes mellitus ([place]) ?  SIRS (systemic inflammatory response syndrome) ([facility]) ?  CAD S/P percutaneous coronary angioplasty ?  Marijuana abuse ?  Hyperlipidemia LDL goal <70 ?  Heart failure with reduced ejection fraction ([place]) ?  Acute respiratory failure with hypoxia ([place]) ? ? ?NSTEMI CAD ?-Patient has had cardiac catheterization without significant finding. ?-Cardiology team is directing.   ?-Medical management for now.    ?  ?SIRS/sepsis ?-Persistent tachycardia overnight. ?-Hypertension, with systolic blood pressure in the 60s. ?-Elevated procalcitonin. ?-Worsening metabolic acidosis. ?-Mild worsening of renal function. ?-Worsening chest x-ray findings as documented above. ?-However, leukocytosis is improving. ?-Panculture patient. ?-Aggressive resuscitation. ?-Transfer to ICU for further management. ?  ?Acute respiratory failure with hypoxia Heart failure with reduced EF  ?-Patient is requiring supplemental oxygen. ?-Rule out noncardiogenic pulmonary edema.  Hypotension ?-Hypotension has resolved. ?-Last blood pressure 129/79 mmHg  ? ?Severe metabolic acidosis: ?-Etiology unclear. ?-DC Tylenol. ?-Patient is a diabetic, however, blood she has no significant elevated. ?-1 amp of sodium bicarbonate (25 mEq) x1 dose given. ?-Bicarb drip. ?-Continue to work-up for possible etiology. ?-Lactic acid has ranged from 1.7-2.2. ? ?Mild respiratory alkalosis: ?-Monitor patient's anxiety. ?-Continue to monitor closely. ?  ?Hyperlipidemia ?-Continue statins.   ?      ?Diabetes mellitus type 1, with hypoglycemia ?Patient on insulin pump.  On admission initial blood sugar was noted to be 62.  Last available hemoglobin A1c 8.7 on 03/25/2021.  Patient requested to manage pump. ?-Insulin pump order set utilized ?-Hypoglycemic protocols with D50 as needed for low blood sugars ?-CBGs before every meal ?  ?  Hypothyroidism ?Home regimen includes levothyroxine 100 mcg  daily.  Last TSH 0.737 on 03/25/2021. ?-Continue levothyroxine ?  ?Anxiety ?Medication regimen includes Prozac 40 mg daily as needed. ?-Will need to discussed that this is not a as needed medication and needs to be taken on a consistent basis to have benefit ?  ?Marijuana abuse ? ?Persistent nausea and vomiting: ?-Rule out gastroparesis. ?-Improved significantly with IV Reglan. ?-No further nausea or vomiting reported.   ? ?DVT prophylaxis: Lovenox 40 Mg once daily. ?Code Status: Full code ?Family Communication:  ?Disposition Plan:  ? ? ?Consultants:  ?Cardiology. ? ?Procedures:  ?Cardiac catheterization ? ?Antimicrobials:  ?IV Rocephin ?IV doxycycline ? ? ?Subjective: ?Abdominal discomfort. ? ?Objective: ?Vitals:  ? 05/13/21 1036 05/13/21 1200 05/13/21 1215 05/13/21 1230  ?BP: (!) 80/44 (!) 81/52 (!) 77/52 (!) 85/46  ?Pulse:  96 98 93  ?Resp:  (!) '21 18 19  '$ ?Temp:      ?TempSrc:      ?SpO2:  98% 99% 98%  ?Weight:      ? ? ?Intake/Output Summary (Last 24 hours) at 05/13/2021 1232 ?Last data filed at 05/13/2021 0600 ?Gross per 24 hour  ?Intake 1100 ml  ?Output --  ?Net 1100 ml  ? ? ?Filed Weights  ? 05/13/21 0502  ?Weight: 63.8 kg  ? ? ?Examination: ? ?General exam: Appears to be in minimal discomfort.  Awake and alert.    ?Respiratory system: Clear to auscultation.  ?Cardiovascular system: S1 & S2  ?Gastrointestinal system: Vague abdominal discomfort.   ?Central nervous system: Alert and oriented. No focal neurological deficits. ?Extremities: No leg edema. ? ?Data Reviewed: I have personally reviewed following labs and imaging studies ? ?CBC: ?Recent Labs  ?Lab 05/10/21 ?1914 05/10/21 ?1948 05/11/21 ?3875 05/11/21 ?1355 05/12/21 ?6433 05/13/21 ?0202  ?WBC 14.7*  --  13.5*  --  18.4* 12.1*  ?NEUTROABS 13.5*  --   --   --   --  10.9*  ?HGB 13.3 13.9 12.5 11.9* 13.1 12.1  ?HCT 40.1 41.0 36.9 35.0* 39.0 36.0  ?MCV 91.3  --  91.3  --  91.8 91.1  ?PLT 518*  --  449*  --  421* 362  ? ? ?Basic Metabolic Panel: ?Recent Labs   ?Lab 05/10/21 ?2219 05/11/21 ?0747 05/11/21 ?1355 05/12/21 ?0233 05/12/21 ?1332 05/13/21 ?0202  ?NA 140 136 132* 137 137 136  ?K 4.0 4.0 4.0 5.2* 3.7 3.8  ?CL 110 106 105 108 109 109  ?CO2 18* 22  --  15* 17* 13*  ?GLUCOSE 62* 128* 160* 183* 173* 231*  ?BUN 20 23* 23* 21* 21* 20  ?CREATININE 1.09* 1.09* 0.80 0.97 0.94 1.08*  ?CALCIUM 9.2 9.1  --  9.1 8.6* 8.3*  ?MG  --   --   --   --   --  1.9  ?PHOS  --   --   --   --   --  1.7*  ? ? ?GFR: ?Estimated Creatinine Clearance: 54.8 mL/min (A) (by C-G formula based on SCr of 1.08 mg/dL (H)). ?Liver Function Tests: ?Recent Labs  ?Lab 05/11/21 ?2951 05/13/21 ?0202  ?AST 44*  --   ?ALT 19  --   ?ALKPHOS 71  --   ?BILITOT 0.7  --   ?PROT 7.3  --   ?ALBUMIN 3.9 3.0*  ? ? ?Recent Labs  ?Lab 05/11/21 ?8841  ?LIPASE 22  ? ? ?No results for input(s): AMMONIA in the last 168 hours. ?Coagulation Profile: ?No results for input(s): INR, PROTIME in  the last 168 hours. ?Cardiac Enzymes: ?No results for input(s): CKTOTAL, CKMB, CKMBINDEX, TROPONINI in the last 168 hours. ?BNP (last 3 results) ?No results for input(s): PROBNP in the last 8760 hours. ?HbA1C: ?No results for input(s): HGBA1C in the last 72 hours. ?CBG: ?Recent Labs  ?Lab 05/12/21 ?1741 05/12/21 ?2053 05/13/21 ?0257 05/13/21 ?8527 05/13/21 ?1150  ?GLUCAP 217* 171* 179* 271* 275*  ? ? ?Lipid Profile: ?Recent Labs  ?  05/11/21 ?0747  ?CHOL 258*  ?HDL 78  ?LDLCALC 167*  ?TRIG 64  ?CHOLHDL 3.3  ? ? ?Thyroid Function Tests: ?No results for input(s): TSH, T4TOTAL, FREET4, T3FREE, THYROIDAB in the last 72 hours. ?Anemia Panel: ?No results for input(s): VITAMINB12, FOLATE, FERRITIN, TIBC, IRON, RETICCTPCT in the last 72 hours. ?Urine analysis: ?   ?Component Value Date/Time  ? COLORURINE YELLOW 05/11/2021 1014  ? APPEARANCEUR HAZY (A) 05/11/2021 1014  ? LABSPEC 1.020 05/11/2021 1014  ? PHURINE 5.0 05/11/2021 1014  ? GLUCOSEU >=500 (A) 05/11/2021 1014  ? [place] NEGATIVE 11/23/2015 0831  ? HGBUR NEGATIVE 05/11/2021 1014  ?  [place] NEGATIVE 05/11/2021 1014  ? BILIRUBINUR neg 12/07/2015 0843  ? KETONESUR 20 (A) 05/11/2021 1014  ? PROTEINUR 30 (A) 05/11/2021 1014  ? UROBILINOGEN 1.0 12/07/2015 0843  ? UROBILINOGEN 0.2 11/23/2015 0831  ? NIT

## 2021-05-13 NOTE — Consult Note (Signed)
? ?NAME:  Jennifer Hogan, MRN:  [id number], DOB:  03/12/1970, LOS: 2 ?ADMISSION DATE:  05/10/2021, CONSULTATION DATE:  05/13/2021 ?REFERRING MD:  Dr. Marthenia Rolling CHIEF COMPLAINT:  Hypotension  ? ?History of Present Illness:  ?Jennifer Hogan is a 51 y/o female with a PMHx of T2DM on insulin pump, CAD s/p CABG, HTN, HLD, hypothyroidism presented to the ED with complaints of nausea, vomiting and chest pain.  She also endorsed nonproductive cough, chills, and palpitations.  Patient felt palpitations were related to her vomiting.  Her main concern at the time was that she was developing DKA.  ? ?Blood pressure on admission was noted to be low at 71/57 with oxygen saturations as low as 82% with improvement on initiation of supplemental oxygen via nasal cannula.  White blood count elevated at 14.  Due to this, patient was started on Rocephin and doxycycline for community-acquired pneumonia.  Troponins elevated at 347, which continued to elevate up to a maximum of 1580.  Cardiology was consulted for NSTEMI. ?CBG elevated at 296 with beta hydroxybutyric acid on admission elevated at 1.15.  Urinalysis with ketones.  Patient was maintained on her insulin pump. ? ?During admission, repeat TTE demonstrated reduction in LVEF to 30-35% with regional wall motion abnormalities. LHC on 4/19 with no significant changes compared to most recent LHC in September 2022. Recommended continued medical therapy.  ? ?Overnight, patient was noted to be tachycardic into the 120s. Rhythm sinus per telemetry. This AM, patient was noted to have hypotensive with MAPs between 51-64. She endorses nausea but no vomiting; her abdominal pain seemed improved compared to prior. Initial lab work demonstrated significant metabolic acidemia with bicarb of 13; blood glucose 231. BNP elevated at 2640. CXR demonstrates new multi-focal opacities, worse on the right upper lobe. PCCM consulted for hypotension.  ? ?Pertinent  Medical History  ?Type 1 diabetes ?CAD  s/p CABG (2020) ?HFrEF with recovered EF ?Hypertension ?Hyperlipidemia ?Gastroparesis ?Hypothyroidism on Synthroid ?Depression and anxiety ?THC abuse ? ?Significant Hospital Events: ?Including procedures, antibiotic start and stop dates in addition to other pertinent events   ?4/19: Presented to [facility].  Admitted for NSTEMI.  LHC with no acute changes compared to prior.  Recommended medical management.  TTE with mild reduction in EF from 40 to 45% to 30 to 35% ?4/21: PCCM consulted for hypotension.  Transferred to ICU for shock ? ?Interim History / Subjective:  ?As above ? ?Objective   ?Blood pressure (!) 80/44, pulse (!) 127, temperature 98.1 ?F (36.7 ?C), temperature source Oral, resp. rate 20, weight 63.8 kg, SpO2 90 %. ?   ?   ? ?Intake/Output Summary (Last 24 hours) at 05/13/2021 1219 ?Last data filed at 05/13/2021 0600 ?Gross per 24 hour  ?Intake 1100 ml  ?Output --  ?Net 1100 ml  ? ?Filed Weights  ? 05/13/21 0502  ?Weight: 63.8 kg  ? ?Examination: ?General: No acute distress.  Resting comfortably in bed. ?HENT: Dry mucous membranes with chapped lips.  Anicteric. ?Lungs: Rales present in left lower lung field with rhonchi present in right midlung field.  No wheezing.  No increased work of breathing.  No tachypnea. ?Cardiovascular: Regular rate and rhythm, no murmurs. ?Abdomen: Soft, nondistended, nontender.  Normal active bowel sounds. ?Extremities: Warm and dry.  No pitting edema of the extremities. ?Neuro: Alert and oriented x3.  No focal deficits noted. ?GU: Deferred ? ?Resolved Hospital Problem list   ?Acute Hypoxic Respiratory Failure ? ?Assessment & Plan:  ? ?Diabetic Ketoacidosis  ?Hypovolemic Shock ?Initially  hypotensive on admission but rapidly improved. Now with new hypotension that acutely worsened this AM. Primary differential includes hypovolemic 2/2 DKA. This is supported by elevated BHA at 3, AGMA. Although patient was started on a BB, low suspicion for cardiogenic shock given examination. No  significant end organ damage as of yet. There is a slight bump in creatinine but not enough to indicate AKI. ? ?- Start DKA protocol ?- IV insulin per [employer]  ?- Start IVF cautiously given HFrEF with LR @ 75 cc/hr ?- Transition to D5-LR @ 75 cc/hr when CBG <250 ?- BMP q4h ?- BHA q8h ?- Will plan to transition back on home insulin pump once AG closed x2 and BHA normalizes x2 ?- Discontinue bicarbonate infusion  ?- Start Phenylephrine for BP support. Wean as tolerated for MAP goal > 65 ? ?Anion Gap Metabolic Acidosis ?Compensatory Respiratory Alkalosis  ?Worsening acidosis in the last 24 hours with AG elevated above 12 consistent with AGMA. Suspect this will improve with treatment of DKA. No indication for bicarbonate infusion.  ? ?- Discontinue bicarbonate infusion  ? ?Community Acquired Pneumonia ?Acute Hypoxic Respiratory Failure - Resolved ?History of COPD ?- Continue supplemental oxygen to maintain O2 saturation > 88%. Wean as tolerated ?- Continue Rocephin and Doxycycline to complete 5 day course  ?- Continue home bronchodilators as ordered ? ?NSTEMI ?Chronic HFrEF (EF 30-35%)  ?CAD s/p CABG ?Hyperlipidemia  ?Long-term history of CAD complicated by ischemic cardiomyopathy with reduced EF in 09/2020. Initially improved but TTE during this admission demonstrates further new reduction in EF, likely due to inadequate GDMT. LHC shows unchanged CAD. Unfortunately, ARB/ARNI and MRA contraindicated due to history of hyperkalemia. SGLT2i contraindicated due to T1DM. Patient has been non-compliant with BB.  ? ?Given IVF administration for DKA, will monitor fluid status carefully. Despite elevated BNP @ 2600, patient appears dry on examination.  ? ?- Cardiology following; appreciate their recommendations  ?- Continue DAPT ?- Discontinue Amlodipine and Metoprolol given hypotension and pressor requirement. Will restart when able.  ? ?Hypothyroidism  ?- Continue home Synthroid  ? ?Anxiety ?- Continue home Prozac ? ?Best  Practice (right click and "Reselect all SmartList Selections" daily)  ? ?Diet/type: NPO ?DVT prophylaxis: LMWH ?GI prophylaxis: N/A ?Lines: N/A ?Foley:  N/A ?Code Status:  full code ?Last date of multidisciplinary goals of care discussion [Patient updated at bedside on 4/21] ? ?Labs   ?CBC: ?Recent Labs  ?Lab 05/10/21 ?1914 05/10/21 ?1948 05/11/21 ?4010 05/11/21 ?1355 05/12/21 ?2725 05/13/21 ?0202  ?WBC 14.7*  --  13.5*  --  18.4* 12.1*  ?NEUTROABS 13.5*  --   --   --   --  10.9*  ?HGB 13.3 13.9 12.5 11.9* 13.1 12.1  ?HCT 40.1 41.0 36.9 35.0* 39.0 36.0  ?MCV 91.3  --  91.3  --  91.8 91.1  ?PLT 518*  --  449*  --  421* 362  ? ? ?Basic Metabolic Panel: ?Recent Labs  ?Lab 05/10/21 ?2219 05/11/21 ?0747 05/11/21 ?1355 05/12/21 ?0233 05/12/21 ?1332 05/13/21 ?0202  ?NA 140 136 132* 137 137 136  ?K 4.0 4.0 4.0 5.2* 3.7 3.8  ?CL 110 106 105 108 109 109  ?CO2 18* 22  --  15* 17* 13*  ?GLUCOSE 62* 128* 160* 183* 173* 231*  ?BUN 20 23* 23* 21* 21* 20  ?CREATININE 1.09* 1.09* 0.80 0.97 0.94 1.08*  ?CALCIUM 9.2 9.1  --  9.1 8.6* 8.3*  ?MG  --   --   --   --   --  1.9  ?PHOS  --   --   --   --   --  1.7*  ? ?GFR: ?Estimated Creatinine Clearance: 54.8 mL/min (A) (by C-G formula based on SCr of 1.08 mg/dL (H)). ?Recent Labs  ?Lab 05/10/21 ?1914 05/11/21 ?0747 05/12/21 ?0233 05/12/21 ?1431 05/12/21 ?1837 05/13/21 ?0202  ?PROCALCITON  --  1.01  --   --   --  1.91  ?WBC 14.7* 13.5* 18.4*  --   --  12.1*  ?LATICACIDVEN  --  1.7  --  2.2* 2.1*  --   ? ? ?Liver Function Tests: ?Recent Labs  ?Lab 05/11/21 ?3159 05/13/21 ?0202  ?AST 44*  --   ?ALT 19  --   ?ALKPHOS 71  --   ?BILITOT 0.7  --   ?PROT 7.3  --   ?ALBUMIN 3.9 3.0*  ? ?Recent Labs  ?Lab 05/11/21 ?4585  ?LIPASE 22  ? ?No results for input(s): AMMONIA in the last 168 hours. ? ?ABG ?   ?Component Value Date/Time  ? PHART 7.43 05/13/2021 1130  ? PCO2ART 22 (L) 05/13/2021 1130  ? PO2ART 101 05/13/2021 1130  ? HCO3 14.6 (L) 05/13/2021 1130  ? TCO2 17 (L) 05/11/2021 1355  ? ACIDBASEDEF  7.6 (H) 05/13/2021 1130  ? O2SAT 99.5 05/13/2021 1130  ?  ? ?Coagulation Profile: ?No results for input(s): INR, PROTIME in the last 168 hours. ? ?Cardiac Enzymes: ?No results for input(s): CKTOTAL, CKMB, CKMBIN

## 2021-05-14 DIAGNOSIS — E1010 Type 1 diabetes mellitus with ketoacidosis without coma: Secondary | ICD-10-CM

## 2021-05-14 LAB — BASIC METABOLIC PANEL
Anion gap: 7 (ref 5–15)
Anion gap: 6 (ref 5–15)
BUN: 13 mg/dL (ref 6–20)
BUN: 12 mg/dL (ref 6–20)
CO2: 18 mmol/L — ABNORMAL LOW (ref 22–32)
CO2: 22 mmol/L (ref 22–32)
Calcium: 7.8 mg/dL — ABNORMAL LOW (ref 8.9–10.3)
Calcium: 8 mg/dL — ABNORMAL LOW (ref 8.9–10.3)
Chloride: 111 mmol/L (ref 98–111)
Chloride: 111 mmol/L (ref 98–111)
Creatinine, Ser: 0.84 mg/dL (ref 0.44–1.00)
Creatinine, Ser: 0.9 mg/dL (ref 0.44–1.00)
GFR, Estimated: >60 mL/min (ref >60)
GFR, Estimated: >60 mL/min (ref >60)
Glucose, Bld: 133 mg/dL — ABNORMAL HIGH (ref 70–99)
Glucose, Bld: 152 mg/dL — ABNORMAL HIGH (ref 70–99)
Potassium: 3.4 mmol/L — ABNORMAL LOW (ref 3.5–5.1)
Potassium: 3.6 mmol/L (ref 3.5–5.1)
Sodium: 136 mmol/L (ref 135–145)
Sodium: 139 mmol/L (ref 135–145)

## 2021-05-14 LAB — CBC
HCT: 27.8 % — ABNORMAL LOW (ref 36.0–46.0)
Hemoglobin: 9.7 g/dL — ABNORMAL LOW (ref 12.0–15.0)
MCH: 31.5 pg (ref 26.0–34.0)
MCHC: 34.9 g/dL (ref 30.0–36.0)
MCV: 90.3 fL (ref 80.0–100.0)
Platelets: 358 10*3/uL (ref 150–400)
RBC: 3.08 MIL/uL — ABNORMAL LOW (ref 3.87–5.11)
RDW: 15 % (ref 11.5–15.5)
WBC: 7.6 10*3/uL (ref 4.0–10.5)
nRBC: 0 % (ref 0.0–0.2)

## 2021-05-14 LAB — GLUCOSE, CAPILLARY
Glucose-Capillary: 107 mg/dL — ABNORMAL HIGH (ref 70–99)
Glucose-Capillary: 122 mg/dL — ABNORMAL HIGH (ref 70–99)
Glucose-Capillary: 138 mg/dL — ABNORMAL HIGH (ref 70–99)
Glucose-Capillary: 144 mg/dL — ABNORMAL HIGH (ref 70–99)
Glucose-Capillary: 146 mg/dL — ABNORMAL HIGH (ref 70–99)
Glucose-Capillary: 151 mg/dL — ABNORMAL HIGH (ref 70–99)
Glucose-Capillary: 162 mg/dL — ABNORMAL HIGH (ref 70–99)
Glucose-Capillary: 167 mg/dL — ABNORMAL HIGH (ref 70–99)
Glucose-Capillary: 168 mg/dL — ABNORMAL HIGH (ref 70–99)
Glucose-Capillary: 174 mg/dL — ABNORMAL HIGH (ref 70–99)
Glucose-Capillary: 203 mg/dL — ABNORMAL HIGH (ref 70–99)
Glucose-Capillary: 212 mg/dL — ABNORMAL HIGH (ref 70–99)
Glucose-Capillary: 92 mg/dL (ref 70–99)

## 2021-05-14 LAB — BETA-HYDROXYBUTYRIC ACID
Beta-Hydroxybutyric Acid: 0.25 mmol/L (ref 0.05–0.27)
Beta-Hydroxybutyric Acid: 0.11 mmol/L (ref 0.05–0.27)

## 2021-05-14 MED ORDER — PANTOPRAZOLE SODIUM 40 MG PO TBEC
40.0000 mg | DELAYED_RELEASE_TABLET | Freq: Every day | ORAL | Status: DC | PRN
  Administered 2021-05-14: 40 mg via ORAL
  Filled 2021-05-14: qty 1

## 2021-05-14 MED ORDER — MIDODRINE HCL 5 MG PO TABS
5.0000 mg | ORAL_TABLET | Freq: Three times a day (TID) | ORAL | Status: DC
  Administered 2021-05-14 – 2021-05-15 (×3): 5 mg via ORAL
  Filled 2021-05-14 (×3): qty 1

## 2021-05-14 MED ORDER — INSULIN PUMP
Freq: Three times a day (TID) | SUBCUTANEOUS | Status: DC
  Administered 2021-05-14 (×2): 1.4 via SUBCUTANEOUS
  Filled 2021-05-14: qty 1

## 2021-05-14 NOTE — Progress Notes (Signed)
[facility] night cross cover note: ? ?I was contacted by [facility] Nurse regarding this patient's level of care.  This is a 51 year old female who was originally admitted on 05/10/2021 to the hospitalist service with NSTEMI and subsequently found to have worsening LVEF, now 35%, relative to previously known chronic systolic heart failure with prior LVEF 45%.  Ensuing left-sided heart cath showed unchanged coronaries relative to most recent prior left-sided coronary angiography in 2022.   ? ?Ensuing hospital course was complicated by development of hypotension, prompting the patient to be transferred to the critical care service, with initiation of pressors.  She has now been completely off of pressors for greater than 12 hours maintaining normotensive blood pressures in their absence.  Additionally, while on critical care service, patient was also found to be in DKA, which has resolved following management that included insulin drip, which has also been subsequently discontinued.  ? ?The patient was subsequently transferred back to the hospitalist service, and critical care service signed off.  Consequently, at this time she is on the hospitalist service with the patient's physical location in the ICU.  ? ?In light of the above, I placed order to transfer the patient out of the ICU to the PCU.  Otherwise, I made no modifications to the existing plan or orders as outlined by the day hospitalist. ? ? ? ? ?Babs Bertin, DO ?Hospitalist ? ?

## 2021-05-14 NOTE — Progress Notes (Signed)
? ?Progress Note ? ?Patient Name: Jennifer Hogan ?Date of Encounter: 05/14/2021 ? ?Primary Cardiologist: Fransico Him, MD ? ?Subjective  ? ?No chest pain or breathlessness.  Abdominal pain has improved.  Would like to have something to eat. ? ?Inpatient Medications  ?  ?Scheduled Meds: ? aspirin EC  81 mg Oral Daily  ? Chlorhexidine Gluconate Cloth  6 each Topical Daily  ? clopidogrel  75 mg Oral QHS  ? diphenhydrAMINE  25 mg Oral QHS  ? enoxaparin (LOVENOX) injection  40 mg Subcutaneous Q24H  ? levothyroxine  100 mcg Oral QAC breakfast  ? montelukast  10 mg Oral QHS  ? sodium chloride flush  3 mL Intravenous Q12H  ? sodium chloride flush  3 mL Intravenous Q12H  ? ?Continuous Infusions: ? sodium chloride 10 mL/hr at 05/14/21 0500  ? sodium chloride 250 mL (05/13/21 1205)  ? cefTRIAXone (ROCEPHIN)  IV 200 mL/hr at 05/14/21 0500  ? dextrose 5% lactated ringers 75 mL/hr at 05/14/21 6063  ? doxycycline (VIBRAMYCIN) IV Stopped (05/14/21 0001)  ? insulin 1.2 Units/hr (05/14/21 0500)  ? phenylephrine (NEO-SYNEPHRINE) Adult infusion 50 mcg/min (05/14/21 0500)  ? ?PRN Meds: ?sodium chloride, albuterol, alum & mag hydroxide-simeth, budesonide, dextrose, dextrose, FLUoxetine, fluticasone, hydrOXYzine, morphine injection, sodium chloride flush  ? ?Vital Signs  ?  ?Vitals:  ? 05/14/21 0515 05/14/21 0530 05/14/21 0545 05/14/21 0600  ?BP: 110/77 93/62 (!) 85/65 (!) 84/66  ?Pulse: 88 81 77 75  ?Resp:      ?Temp:      ?TempSrc:      ?SpO2: 98% 98% 100% 99%  ?Weight:      ? ? ?Intake/Output Summary (Last 24 hours) at 05/14/2021 0736 ?Last data filed at 05/14/2021 0500 ?Gross per 24 hour  ?Intake 2471.03 ml  ?Output 500 ml  ?Net 1971.03 ml  ? ?Filed Weights  ? 05/13/21 0502 05/14/21 0500  ?Weight: 63.8 kg 63.8 kg  ? ? ?Telemetry  ?  ?Sinus rhythm.  Personally reviewed. ? ?ECG  ?  ?An ECG dated 05/13/2021 was personally reviewed today and demonstrated:  Sinus tachycardia with LVH and repolarization abnormalities, PVCs. ? ?Physical Exam   ? ?GEN: No acute distress.   ?Neck: No JVD. ?Cardiac: RRR, no murmur, rub, or gallop.  ?Respiratory: Nonlabored. Clear to auscultation bilaterally. ?GI: Soft, nontender, bowel sounds present. ?MS: No edema; No deformity. ?Neuro:  Nonfocal. ?Psych: Alert and oriented x 3. Normal affect. ? ?Labs  ?  ?Chemistry ?Recent Labs  ?Lab 05/11/21 ?0160 05/11/21 ?1355 05/13/21 ?0202 05/13/21 ?1421 05/13/21 ?1093 05/14/21 ?0056 05/14/21 ?0421  ?NA 136   < > 136   < > 137 136 139  ?K 4.0   < > 3.8   < > 3.4* 3.4* 3.6  ?CL 106   < > 109   < > 108 111 111  ?CO2 22   < > 13*   < > 20* 18* 22  ?GLUCOSE 128*   < > 231*   < > 133* 133* 152*  ?BUN 23*   < > 20   < > '15 13 12  '$ ?CREATININE 1.09*   < > 1.08*   < > 0.95 0.84 0.90  ?CALCIUM 9.1   < > 8.3*   < > 8.2* 7.8* 8.0*  ?PROT 7.3  --   --   --   --   --   --   ?ALBUMIN 3.9  --  3.0*  --   --   --   --   ?  AST 44*  --   --   --   --   --   --   ?ALT 19  --   --   --   --   --   --   ?ALKPHOS 71  --   --   --   --   --   --   ?BILITOT 0.7  --   --   --   --   --   --   ?GFRNONAA >60   < > >60   < > >60 >60 >60  ?ANIONGAP 8   < > 14   < > '9 7 6  '$ ? < > = values in this interval not displayed.  ?  ? ?Hematology ?Recent Labs  ?Lab 05/12/21 ?0233 05/13/21 ?0202 05/14/21 ?0421  ?WBC 18.4* 12.1* 7.6  ?RBC 4.25 3.95 3.08*  ?HGB 13.1 12.1 9.7*  ?HCT 39.0 36.0 27.8*  ?MCV 91.8 91.1 90.3  ?MCH 30.8 30.6 31.5  ?MCHC 33.6 33.6 34.9  ?RDW 14.8 14.9 15.0  ?PLT 421* 362 358  ? ? ?Cardiac Enzymes ?Recent Labs  ?Lab 05/10/21 ?1914 05/10/21 ?2219 05/11/21 ?0747 05/11/21 ?1004  ?TROPONINIHS 347* 979* 1,580* 1,393*  ? ? ?BNP ?Recent Labs  ?Lab 05/11/21 ?2836 05/13/21 ?0202  ?BNP 2,412.6* 2,640.5*  ?  ? ? ?Radiology  ?  ?DG CHEST PORT 1 VIEW ? ?Result Date: 05/13/2021 ?CLINICAL DATA:  Encounter for hypoxia. EXAM: PORTABLE CHEST 1 VIEW COMPARISON:  PA Lat 05/10/2021. FINDINGS: The cardiac size is normal with old CABG changes, intact sternotomy sutures. The central vasculature appears normal in caliber. The  lungs hyperinflated as before. Chronic interstitial changes are again noted in the lower lung fields. Since 05/10/2021 there is developed dense perihilar consolidation in the right upper lobe with additional subpleural consolidation in the right apex and additional less dense airspace disease in the right infrahilar/medial basal area and peripheral left upper to mid lung field. Given the short interval time frame of the appearance of this, this is probably either due to widespread pneumonia or noncardiogenic pulmonary edema with asymmetries. Elsewhere the lungs are clear. No significant pleural effusion is seen. No acute osseous findings. IMPRESSION: Interval new multifocal lung opacities, densest and most confluent in the medial right upper lobe, findings most likely either due to multilobar pneumonia or noncardiogenic pulmonary edema with asymmetry Clinical correlation and radiographic follow-up recommended. Background COPD. Electronically Signed   By: Telford Nab M.D.   On: 05/13/2021 02:45   ? ?Cardiac Studies  ? ?Cardiac catheterization 05/11/2021: ?  Prox LAD to Mid LAD lesion is 100% stenosed. ?  Dist LAD-1 lesion is 70% stenosed. ?  Dist LAD-2 lesion is 70% stenosed. ?  Mid LAD lesion is 60% stenosed. ?  Ost Cx to Prox Cx lesion is 99% stenosed. ?  Prox Cx to Mid Cx lesion is 70% stenosed. ?  Ramus-1 lesion is 60% stenosed. ?  Ramus-2 lesion is 20% stenosed. ?  Ramus-3 lesion is 70% stenosed. ?  RPAV lesion is 60% stenosed. ?  Non-stenotic Mid LAD to Dist LAD lesion was previously treated. ?  LIMA graft was visualized by angiography and is normal in caliber. ?  RIMA graft was visualized by non-selective angiography and is normal in caliber. ?  The graft exhibits no disease. ?  LV end diastolic pressure is moderately elevated. ?  ?Severe 2 vessel obstructive CAD ?Patent sequential LIMA graft  to the ramus intermediate and distal LAD ?Patent RIMA to the  distal LCx ?Moderately elevated LVEDP ?  ?Compared to  September 2022 there is no significant angiographic change. Recommend continued medical therapy.  ? ?Echocardiogram 05/11/2021: ? 1. Diffuse hypokinesis worse in septum and apex . Left ventricular  ?ejection fraction, by estimation, is 30 to 35%. The left ventricle has  ?moderately decreased function. The left ventricle demonstrates global  ?hypokinesis. The left ventricular internal  ?cavity size was severely dilated. Left ventricular diastolic parameters  ?are indeterminate.  ? 2. Right ventricular systolic function is normal. The right ventricular  ?size is normal. There is normal pulmonary artery systolic pressure.  ? 3. The mitral valve is abnormal. Mild mitral valve regurgitation. No  ?evidence of mitral stenosis.  ? 4. The aortic valve is tricuspid. Aortic valve regurgitation is not  ?visualized. No aortic stenosis is present.  ? 5. The inferior vena cava is normal in size with greater than 50%  ?respiratory variability, suggesting right atrial pressure of 3 mmHg.  ? ?Assessment & Plan  ?  ?1.  NSTEMI, likely type II event with peak high-sensitivity troponin high 1580.  No chest pain. ? ?2.  Multivessel CAD status post CABG.  Cardiac catheterization during this admission shows patent LIMA to ramus intermedius and distal LAD, also patent RIMA to distal circumflex.  No obvious targets as culprit for NSTEMI and plan for medical therapy.  Vasospasm considered. ? ?3.  HFrEF with LVEF 30 to 35% and global hypokinesis, more prominent in the septum and apex. ? ?4.  SIRS/sepsis.  Currently on Rocephin and Vibramycin.  Also Neo-Synephrine with recent systolics 85I to 77O.  Chest x-ray suggestive of pneumonia.  Procalcitonin elevated with initial lactate 2.2.  Blood cultures are negative so far. ? ?5.  UDS positive for benzodiazepines, opiates, THC. ? ?6.  Mixed hyperlipidemia with multi statin intolerance.  Previously on Praluent as an outpatient. ? ?7.  Type 1 diabetes mellitus, on insulin pump as an  outpatient. ? ?Chart reviewed.  Continue aspirin and Plavix.  Continue supportive measures and work-up per primary team.  Once weaned off Neo-Synephrine and otherwise stable can turn attention to addition of GDMT for trea

## 2021-05-14 NOTE — Progress Notes (Signed)
Inpatient Diabetes Program Recommendations ? ?AACE/ADA: New Consensus Statement on Inpatient Glycemic Control (2015) ? ?Target Ranges:  Prepandial:   less than 140 mg/dL ?     Peak postprandial:   less than 180 mg/dL (1-2 hours) ?     Critically ill patients:  140 - 180 mg/dL  ? ?Lab Results  ?Component Value Date  ? GLUCAP 151 (H) 05/14/2021  ? HGBA1C 8.7 (A) 03/25/2021  ? ? ?Review of Glycemic Control ? Latest Reference Range & Units 05/14/21 07:03 05/14/21 09:03 05/14/21 10:07  ?Glucose-Capillary 70 - 99 mg/dL 168 (H) 138 (H) 151 (H)  ?Diabetes history: DM1 (requires basal, meal coverage, + correction) ?Outpatient Diabetes medications: [employer] Insulin pump ?basal rate of 0.85 units/hr, 6 AM-10 AM, 0.975 units/hr, 10 AM-10 PM, 0.75 units/hr 10PM-12MN, and 0.625 overnight.  ?bolus of 1 unit/16 grams carbohydrate.  ?correction bolus (which some people call "sensitivity," or "insulin sensitivity ratio," or just "isr") of 1 unit for each 100 by which your glucose exceeds 100.  Total basal = 14.15 units ?Inpatient Diabetes Program Recommendations:   ? ?Note patient to transition back to insulin pump today from insulin drip.  Per RN, patient has restarted insulin pump with new tubing, site, etc. She is bolusing for blood sugar of 212 mg/dL.  Will follow.  ? ?Thanks,  ?Adah Perl, RN, BC-ADM ?Inpatient Diabetes Coordinator ?Pager [phone]  (8a-5p) ? ? ? ?

## 2021-05-14 NOTE — Progress Notes (Signed)
?PROGRESS NOTE ? ? ? ?Jennifer Hogan  WUJ:[id number] DOB: March 24, 1970 DOA: 05/10/2021 ?PCP: [name], NP  ?Outpatient Specialists:  ? ? ? ?Brief Narrative:  ?Patient is a 51 year old Caucasian female past medical history significant for hypertension, hyperlipidemia, coronary artery disease status post CABG, diabetes mellitus type 1 on insulin pump, hypothyroidism and GERD.  Patient was admitted with chest pain, with associated significant GI symptoms.  Patient reported nausea and vomiting since the last 2 to 3 days.  Patient has undergone cardiac catheterization without significant finding. ? ?05/12/2021: Patient seen.  Last nausea vomiting was this morning.  Patient continues to report abdominal discomfort.  Patient is asking for some antacid.  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  We will repeat BMP stat to ensure accuracy of the lab result. ? ?05/13/2021: Patient remains tachycardic overnight necessitating 12.5 mg of beta-blocker.  Patient was also given Norvasc 2.5 Mg p.o. x1 dose.  Patient has been hypotensive, with systolic blood pressure in the 60s.  Patient is more acidotic.  Chest x-ray revealed widespread patchy infiltrate, worrisome for pneumonia, pulmonary edema possibly noncardiac.  Low EF is noted.  Patient has had abdominal symptoms with nausea and vomiting.  Leukocytosis is improving.  Patient is on IV ceftriaxone and doxycycline.  CO2 was just last 13 days morning.  Stat ABG revealed pH of 7.43, PCO2 of 22 and PO2 of 101 (suggestive of metabolic acidosis with possible respiratory alkalosis).  Patient was seen emergently.  IV bicarb given.  Start patient on bicarb drip.  We will panculture patient.  ICU team consulted to assume care.  Patient will be transferred to the ICU team for further management.  Worsening renal function noted this morning.  No documented fever. ? ?05/14/2021: Patient seen.  Patient has been transferred back to the hospitalist team  today.  Acidosis has resolved significantly.  Etiology of the acidosis remains unclear.  Not certain how much Tylenol patient has been taking.  Overall, patient looks much better. ? ? ?Assessment & Plan: ?  ?Principal Problem: ?  NSTEMI (non-ST elevated myocardial infarction) ([place]) ?Active Problems: ?  Hypothyroidism ?  Type 1 diabetes mellitus ([place]) ?  SIRS (systemic inflammatory response syndrome) ([facility]) ?  CAD S/P percutaneous coronary angioplasty ?  Marijuana abuse ?  Hyperlipidemia LDL goal <70 ?  Heart failure with reduced ejection fraction ([place]) ?  Acute respiratory failure with hypoxia ([place]) ? ? ?NSTEMI CAD ?-Patient has had cardiac catheterization without significant finding. ?-Cardiology team is directing.   ?-Medical management for now.    ?  ?SIRS/sepsis ?-Persistent tachycardia overnight. ?-Hypertension, with systolic blood pressure in the 60s. ?-Elevated procalcitonin. ?-Worsening metabolic acidosis. ?-Mild worsening of renal function. ?-Worsening chest x-ray findings as documented above. ?-However, leukocytosis is improving. ?-Panculture patient. ?-Aggressive resuscitation. ?-Transfer to ICU for further management. ?05/14/2021: Transfer back to the hospitalist team today.  Patient looks much better.  Sepsis physiology has resolved significantly. ?  ?Acute respiratory failure with hypoxia Heart failure with reduced EF  ?-Patient is requiring supplemental oxygen. ?-Rule out noncardiogenic pulmonary edema.  Hypotension ?-Hypotension has resolved. ?-Last blood pressure 129/79 mmHg  ?05/14/2021: Resolving.   ? ?Severe metabolic acidosis: ?-Etiology unclear. ?-DC Tylenol. ?-Patient is a diabetic, however, blood she has no significant elevated. ?-1 amp of sodium bicarbonate (25 mEq) x1 dose given. ?-Bicarb drip. ?-Continue to work-up for possible etiology. ?-Lactic acid has ranged from 1.7-2.2. ?05/15/2021: Resolved significantly.  Etiology unclear. ? ?Mild respiratory alkalosis: ?-Monitor patient's  anxiety. ?-Continue to monitor closely. ?  ?Hyperlipidemia ?-Continue statins.   ?      ?Diabetes mellitus type 1, with hypoglycemia ?Patient on insulin pump.  On admission initial blood sugar was noted to be 62.  Last available hemoglobin A1c 8.7 on 03/25/2021.  Patient requested to manage pump. ?-Insulin pump order set utilized ?-Hypoglycemic protocols with D50 as needed for low blood sugars ?-CBGs before every meal ?  ?Hypothyroidism ?Home regimen includes levothyroxine 100 mcg daily.  Last TSH 0.737 on 03/25/2021. ?-Continue levothyroxine ?  ?Anxiety ?Medication regimen includes Prozac 40 mg daily as needed. ?-Will need to discussed that this is not a as needed medication and needs to be taken on a consistent basis to have benefit ?  ?Marijuana abuse ? ?Persistent nausea and vomiting: ?-Rule out gastroparesis. ?-Improved significantly with IV Reglan. ?-No further nausea or vomiting reported.   ?05/14/2021: Resolved. ? ?DVT prophylaxis: Lovenox 40 Mg once daily. ?Code Status: Full code ?Family Communication:  ?Disposition Plan:  ? ? ?Consultants:  ?Cardiology. ? ?Procedures:  ?Cardiac catheterization ? ?Antimicrobials:  ?IV Rocephin ?IV doxycycline ? ? ?Subjective: ?No new complaints. ? ?Objective: ?Vitals:  ? 05/14/21 0930 05/14/21 0945 05/14/21 1000 05/14/21 1100  ?BP: (!) 89/61 91/63 (!) 80/54   ?Pulse: 85 82 74   ?Resp:      ?Temp:    97.6 ?F (36.4 ?C)  ?TempSrc:    Oral  ?SpO2: (!) 89% (!) 83% 98%   ?Weight:      ? ? ?Intake/Output Summary (Last 24 hours) at 05/14/2021 1227 ?Last data filed at 05/14/2021 1000 ?Gross per 24 hour  ?Intake 3104.99 ml  ?Output 875 ml  ?Net 2229.99 ml  ? ? ?Filed Weights  ? 05/13/21 0502 05/14/21 0500  ?Weight: 63.8 kg 63.8 kg  ? ? ?Examination: ? ?General exam: Awake and alert.  Not in any distress.      ?Respiratory system: Clear to auscultation.  ?Cardiovascular system: S1 & S2  ?Gastrointestinal system: Vague abdominal discomfort.   ?Central nervous system: Alert and oriented. No  focal neurological deficits. ?Extremities: No leg edema. ? ?Data Reviewed: I have personally reviewed following labs and imaging studies ? ?CBC: ?Recent Labs  ?Lab 05/10/21 ?1914 05/10/21 ?1948 05/11/21 ?3220 05/11/21 ?1355 05/12/21 ?2542 05/13/21 ?0202 05/14/21 ?0421  ?WBC 14.7*  --  13.5*  --  18.4* 12.1* 7.6  ?NEUTROABS 13.5*  --   --   --   --  10.9*  --   ?HGB 13.3   < > 12.5 11.9* 13.1 12.1 9.7*  ?HCT 40.1   < > 36.9 35.0* 39.0 36.0 27.8*  ?MCV 91.3  --  91.3  --  91.8 91.1 90.3  ?PLT 518*  --  449*  --  421* 362 358  ? < > = values in this interval not displayed.  ? ? ?Basic Metabolic Panel: ?Recent Labs  ?Lab 05/13/21 ?0202 05/13/21 ?1421 05/13/21 ?1801 05/13/21 ?7062 05/14/21 ?0056 05/14/21 ?0421  ?NA 136 137 138 137 136 139  ?K 3.8 3.3* 3.1* 3.4* 3.4* 3.6  ?CL 109 107 108 108 111 111  ?CO2 13* 20* 20* 20* 18* 22  ?GLUCOSE 231* 199* 98 133* 133* 152*  ?BUN '20 19 15 15 13 12  '$ ?CREATININE 1.08* 0.93 0.97 0.95 0.84 0.90  ?CALCIUM 8.3* 8.0* 8.5* 8.2* 7.8* 8.0*  ?MG 1.9  --   --   --   --   --   ?PHOS 1.7*  --   --   --   --   --   ? ? ?  GFR: ?Estimated Creatinine Clearance: 65.8 mL/min (by C-G formula based on SCr of 0.9 mg/dL). ?Liver Function Tests: ?Recent Labs  ?Lab 05/11/21 ?7001 05/13/21 ?0202  ?AST 44*  --   ?ALT 19  --   ?ALKPHOS 71  --   ?BILITOT 0.7  --   ?PROT 7.3  --   ?ALBUMIN 3.9 3.0*  ? ? ?Recent Labs  ?Lab 05/11/21 ?7494  ?LIPASE 22  ? ? ?No results for input(s): AMMONIA in the last 168 hours. ?Coagulation Profile: ?No results for input(s): INR, PROTIME in the last 168 hours. ?Cardiac Enzymes: ?No results for input(s): CKTOTAL, CKMB, CKMBINDEX, TROPONINI in the last 168 hours. ?BNP (last 3 results) ?No results for input(s): PROBNP in the last 8760 hours. ?HbA1C: ?No results for input(s): HGBA1C in the last 72 hours. ?CBG: ?Recent Labs  ?Lab 05/14/21 ?0500 05/14/21 ?0703 05/14/21 ?4967 05/14/21 ?1007 05/14/21 ?1117  ?GLUCAP 146* 168* 138* 151* 203*  ? ? ?Lipid Profile: ?No results for input(s):  CHOL, HDL, LDLCALC, TRIG, CHOLHDL, LDLDIRECT in the last 72 hours. ? ?Thyroid Function Tests: ?No results for input(s): TSH, T4TOTAL, FREET4, T3FREE, THYROIDAB in the last 72 hours. ?Anemia Panel: ?No results for input(s

## 2021-05-14 NOTE — Consult Note (Signed)
? ?NAME:  Jennifer Hogan, MRN:  [id number], DOB:  Aug 29, 1970, LOS: 3 ?ADMISSION DATE:  05/10/2021, CONSULTATION DATE:  05/13/2021 ?REFERRING MD:  Dr. Marthenia Rolling CHIEF COMPLAINT:  Hypotension  ? ?History of Present Illness:  ?Jennifer Hogan is a 51 y/o female with a PMHx of T2DM on insulin pump, CAD s/p CABG, HTN, HLD, hypothyroidism presented to the ED with complaints of nausea, vomiting and chest pain.  She also endorsed nonproductive cough, chills, and palpitations.  Patient felt palpitations were related to her vomiting.  Her main concern at the time was that she was developing DKA.  ? ?Blood pressure on admission was noted to be low at 71/57 with oxygen saturations as low as 82% with improvement on initiation of supplemental oxygen via nasal cannula.  White blood count elevated at 14.  Due to this, patient was started on Rocephin and doxycycline for community-acquired pneumonia.  Troponins elevated at 347, which continued to elevate up to a maximum of 1580.  Cardiology was consulted for NSTEMI. ?CBG elevated at 296 with beta hydroxybutyric acid on admission elevated at 1.15.  Urinalysis with ketones.  Patient was maintained on her insulin pump. ? ?During admission, repeat TTE demonstrated reduction in LVEF to 30-35% with regional wall motion abnormalities. LHC on 4/19 with no significant changes compared to most recent LHC in September 2022. Recommended continued medical therapy.  ? ?Overnight, patient was noted to be tachycardic into the 120s. Rhythm sinus per telemetry. This AM, patient was noted to have hypotensive with MAPs between 51-64. She endorses nausea but no vomiting; her abdominal pain seemed improved compared to prior. Initial lab work demonstrated significant metabolic acidemia with bicarb of 13; blood glucose 231. BNP elevated at 2640. CXR demonstrates new multi-focal opacities, worse on the right upper lobe. PCCM consulted for hypotension.  ? ?Pertinent  Medical History  ?Type 1 diabetes ?CAD  s/p CABG (2020) ?HFrEF with recovered EF ?Hypertension ?Hyperlipidemia ?Gastroparesis ?Hypothyroidism on Synthroid ?Depression and anxiety ?THC abuse ? ?Significant Hospital Events: ?Including procedures, antibiotic start and stop dates in addition to other pertinent events   ?4/19: Presented to [facility].  Admitted for NSTEMI.  LHC with no acute changes compared to prior.  Recommended medical management.  TTE with mild reduction in EF from 40 to 45% to 30 to 35% ?4/21: PCCM consulted for hypotension.  Transferred to ICU for shock ? ?Interim History / Subjective:  ? ?Remains on insulin infusion low-dose Neo-Synephrine.  Plans to transition off of both of these a day.  Asking for food would like to eat. ? ?Objective   ?Blood pressure 95/64, pulse 94, temperature 97.9 ?F (36.6 ?C), resp. rate (!) 22, weight 63.8 kg, SpO2 (!) 87 %. ?   ?   ? ?Intake/Output Summary (Last 24 hours) at 05/14/2021 0931 ?Last data filed at 05/14/2021 0800 ?Gross per 24 hour  ?Intake 2883.14 ml  ?Output 875 ml  ?Net 2008.14 ml  ? ?Filed Weights  ? 05/13/21 0502 05/14/21 0500  ?Weight: 63.8 kg 63.8 kg  ? ?Examination: ?General: No acute distress.  Resting comfortably in bed. ?HENT: Dry mucous membranes with chapped lips.  Anicteric. ?Lungs: Rales present in left lower lung field with rhonchi present in right midlung field.  No wheezing.  No increased work of breathing.  No tachypnea. ?Cardiovascular: Regular rate and rhythm, no murmurs. ?Abdomen: Soft, nondistended, nontender.  Normal active bowel sounds. ?Extremities: Warm and dry.  No pitting edema of the extremities. ?Neuro: Alert and oriented x3.  No focal deficits  noted. ?GU: Deferred ? ?Resolved Hospital Problem list   ?Acute Hypoxic Respiratory Failure ? ?Assessment & Plan:  ? ?Diabetic Ketoacidosis, acidosis, resolved ?Hypovolemic Shock ?Septic shock related to right upper lobe pneumonia ?Shock state resolved ?-Overall hypotensive state likely related to acidosis, hypovolemia and sepsis.   Improved with resuscitation ?Plan: ?Transition off of IV insulin to patient's insulin pump. ?Continue to follow BMP. ?Off vasopressors. ?Started low-dose midodrine to help bridge. ?Map goal is greater than 65 mmHg. ?Given that she does have baseline low systolic blood pressures. ?Patient is stable for transfer from the intensive care unit ? ?Anion Gap Metabolic Acidosis ?Compensatory Respiratory Alkalosis  ?-Resolved, secondary to above ? ?Community Acquired Pneumonia ?Acute Hypoxic Respiratory Failure - Resolved ?History of COPD ?-Complete 5-day course Rocephin plus doxycycline ?- Continue to wean oxygen to maintain O2 sats greater than 88%. ? ?NSTEMI, cath with clean coronaries, question vasospasm ?Chronic HFrEF (EF 30-35%)  ?CAD s/p CABG ?Hyperlipidemia  ?Long-term history of CAD complicated by ischemic cardiomyopathy with reduced EF in 09/2020. Initially improved but TTE during this admission demonstrates further new reduction in EF, likely due to inadequate GDMT. LHC shows unchanged CAD. Unfortunately, ARB/ARNI and MRA contraindicated due to history of hyperkalemia. SGLT2i contraindicated due to T1DM. Patient has been non-compliant with BB.  ?Plan: ?[facility] cardiology input ?Continue dual antiplatelet therapy ?Holding BP medications due to hypotension ? ?Hypothyroidism  ?-Continue home Synthroid ? ?Anxiety ?-Continue home Prozac ? ?UDS positive benzodiazepines, opiates and THC. ? ?Best Practice (right click and "Reselect all SmartList Selections" daily)  ? ?Diet/type: NPO ?DVT prophylaxis: LMWH ?GI prophylaxis: N/A ?Lines: N/A ?Foley:  N/A ?Code Status:  full code ?Last date of multidisciplinary goals of care discussion [Patient updated at bedside on 4/21] ? ?Labs   ?CBC: ?Recent Labs  ?Lab 05/10/21 ?1914 05/10/21 ?1948 05/11/21 ?8466 05/11/21 ?1355 05/12/21 ?5993 05/13/21 ?0202 05/14/21 ?0421  ?WBC 14.7*  --  13.5*  --  18.4* 12.1* 7.6  ?NEUTROABS 13.5*  --   --   --   --  10.9*  --   ?HGB 13.3   < > 12.5  11.9* 13.1 12.1 9.7*  ?HCT 40.1   < > 36.9 35.0* 39.0 36.0 27.8*  ?MCV 91.3  --  91.3  --  91.8 91.1 90.3  ?PLT 518*  --  449*  --  421* 362 358  ? < > = values in this interval not displayed.  ? ? ?Basic Metabolic Panel: ?Recent Labs  ?Lab 05/13/21 ?0202 05/13/21 ?1421 05/13/21 ?1801 05/13/21 ?5701 05/14/21 ?0056 05/14/21 ?0421  ?NA 136 137 138 137 136 139  ?K 3.8 3.3* 3.1* 3.4* 3.4* 3.6  ?CL 109 107 108 108 111 111  ?CO2 13* 20* 20* 20* 18* 22  ?GLUCOSE 231* 199* 98 133* 133* 152*  ?BUN '20 19 15 15 13 12  '$ ?CREATININE 1.08* 0.93 0.97 0.95 0.84 0.90  ?CALCIUM 8.3* 8.0* 8.5* 8.2* 7.8* 8.0*  ?MG 1.9  --   --   --   --   --   ?PHOS 1.7*  --   --   --   --   --   ? ?GFR: ?Estimated Creatinine Clearance: 65.8 mL/min (by C-G formula based on SCr of 0.9 mg/dL). ?Recent Labs  ?Lab 05/11/21 ?7793 05/12/21 ?0233 05/12/21 ?1431 05/12/21 ?1837 05/13/21 ?0202 05/13/21 ?1146 05/14/21 ?0421  ?PROCALCITON 1.01  --   --   --  1.91  --   --   ?WBC 13.5* 18.4*  --   --  12.1*  --  7.6  ?LATICACIDVEN 1.7  --  2.2* 2.1*  --  1.6  --   ? ? ?Liver Function Tests: ?Recent Labs  ?Lab 05/11/21 ?6222 05/13/21 ?0202  ?AST 44*  --   ?ALT 19  --   ?ALKPHOS 71  --   ?BILITOT 0.7  --   ?PROT 7.3  --   ?ALBUMIN 3.9 3.0*  ? ?Recent Labs  ?Lab 05/11/21 ?9798  ?LIPASE 22  ? ?No results for input(s): AMMONIA in the last 168 hours. ? ?ABG ?   ?Component Value Date/Time  ? PHART 7.43 05/13/2021 1130  ? PCO2ART 22 (L) 05/13/2021 1130  ? PO2ART 101 05/13/2021 1130  ? HCO3 14.6 (L) 05/13/2021 1130  ? TCO2 17 (L) 05/11/2021 1355  ? ACIDBASEDEF 7.6 (H) 05/13/2021 1130  ? O2SAT 99.5 05/13/2021 1130  ?  ? ?Coagulation Profile: ?No results for input(s): INR, PROTIME in the last 168 hours. ? ?Cardiac Enzymes: ?No results for input(s): CKTOTAL, CKMB, CKMBINDEX, TROPONINI in the last 168 hours. ? ?HbA1C: ?Hemoglobin A1C  ?Date/Time Value Ref Range Status  ?03/25/2021 09:27 AM 8.7 (A) 4.0 - 5.6 % Final  ?10/11/2020 11:24 AM 8.5 (A) 4.0 - 5.6 % Final  ? ?Hgb A1c MFr  Bld  ?Date/Time Value Ref Range Status  ?10/01/2020 03:53 PM 8.5 (H) 4.8 - 5.6 % Final  ?  Comment:  ?  (NOTE) ?Pre diabetes:          5.7%-6.4% ? ?Diabetes:              >6.4% ? ?Glycemic control for   <7

## 2021-05-15 ENCOUNTER — Inpatient Hospital Stay (HOSPITAL_COMMUNITY): Payer: Self-pay

## 2021-05-15 DIAGNOSIS — J189 Pneumonia, unspecified organism: Secondary | ICD-10-CM

## 2021-05-15 LAB — BASIC METABOLIC PANEL
Anion gap: 7 (ref 5–15)
BUN: 8 mg/dL (ref 6–20)
CO2: 21 mmol/L — ABNORMAL LOW (ref 22–32)
Calcium: 8.1 mg/dL — ABNORMAL LOW (ref 8.9–10.3)
Chloride: 112 mmol/L — ABNORMAL HIGH (ref 98–111)
Creatinine, Ser: 0.83 mg/dL (ref 0.44–1.00)
GFR, Estimated: >60 mL/min (ref >60)
Glucose, Bld: 93 mg/dL (ref 70–99)
Potassium: 3.2 mmol/L — ABNORMAL LOW (ref 3.5–5.1)
Sodium: 140 mmol/L (ref 135–145)

## 2021-05-15 LAB — COMPREHENSIVE METABOLIC PANEL
ALT: 15 U/L (ref 0–44)
AST: 24 U/L (ref 15–41)
Albumin: 3.2 g/dL — ABNORMAL LOW (ref 3.5–5.0)
Alkaline Phosphatase: 70 U/L (ref 38–126)
Anion gap: 12 (ref 5–15)
BUN: 11 mg/dL (ref 6–20)
CO2: 19 mmol/L — ABNORMAL LOW (ref 22–32)
Calcium: 8.4 mg/dL — ABNORMAL LOW (ref 8.9–10.3)
Chloride: 105 mmol/L (ref 98–111)
Creatinine, Ser: 0.88 mg/dL (ref 0.44–1.00)
GFR, Estimated: >60 mL/min (ref >60)
Glucose, Bld: 273 mg/dL — ABNORMAL HIGH (ref 70–99)
Potassium: 4.4 mmol/L (ref 3.5–5.1)
Sodium: 136 mmol/L (ref 135–145)
Total Bilirubin: 0.5 mg/dL (ref 0.3–1.2)
Total Protein: 6.6 g/dL (ref 6.5–8.1)

## 2021-05-15 LAB — CBC
HCT: 28.1 % — ABNORMAL LOW (ref 36.0–46.0)
HCT: 35.3 % — ABNORMAL LOW (ref 36.0–46.0)
Hemoglobin: 9.4 g/dL — ABNORMAL LOW (ref 12.0–15.0)
Hemoglobin: 11.9 g/dL — ABNORMAL LOW (ref 12.0–15.0)
MCH: 30.6 pg (ref 26.0–34.0)
MCH: 30.8 pg (ref 26.0–34.0)
MCHC: 33.5 g/dL (ref 30.0–36.0)
MCHC: 33.7 g/dL (ref 30.0–36.0)
MCV: 91.5 fL (ref 80.0–100.0)
MCV: 91.5 fL (ref 80.0–100.0)
Platelets: 325 10*3/uL (ref 150–400)
Platelets: 444 10*3/uL — ABNORMAL HIGH (ref 150–400)
RBC: 3.07 MIL/uL — ABNORMAL LOW (ref 3.87–5.11)
RBC: 3.86 MIL/uL — ABNORMAL LOW (ref 3.87–5.11)
RDW: 15.4 % (ref 11.5–15.5)
RDW: 15.4 % (ref 11.5–15.5)
WBC: 5.8 10*3/uL (ref 4.0–10.5)
WBC: 9.9 10*3/uL (ref 4.0–10.5)
nRBC: 0 % (ref 0.0–0.2)
nRBC: 0 % (ref 0.0–0.2)

## 2021-05-15 LAB — GLUCOSE, CAPILLARY
Glucose-Capillary: 159 mg/dL — ABNORMAL HIGH (ref 70–99)
Glucose-Capillary: 119 mg/dL — ABNORMAL HIGH (ref 70–99)
Glucose-Capillary: 266 mg/dL — ABNORMAL HIGH (ref 70–99)
Glucose-Capillary: 344 mg/dL — ABNORMAL HIGH (ref 70–99)
Glucose-Capillary: 265 mg/dL — ABNORMAL HIGH (ref 70–99)

## 2021-05-15 LAB — LACTIC ACID, PLASMA: Lactic Acid, Venous: 2.5 mmol/L (ref 0.5–1.9)

## 2021-05-15 LAB — TSH: TSH: 2.505 u[IU]/mL (ref 0.350–4.500)

## 2021-05-15 LAB — LIPASE, BLOOD: Lipase: 21 U/L (ref 11–51)

## 2021-05-15 MED ORDER — DOXYCYCLINE HYCLATE 100 MG PO TABS
100.0000 mg | ORAL_TABLET | Freq: Two times a day (BID) | ORAL | Status: DC
  Administered 2021-05-15 – 2021-05-17 (×4): 100 mg via ORAL
  Filled 2021-05-15 (×4): qty 1

## 2021-05-15 MED ORDER — AZITHROMYCIN 500 MG PO TABS
250.0000 mg | ORAL_TABLET | Freq: Every day | ORAL | Status: DC
Start: 2021-05-16 — End: 2021-05-15

## 2021-05-15 MED ORDER — AZITHROMYCIN 500 MG PO TABS: 500.0000 mg | ORAL_TABLET | Freq: Every day | ORAL | Status: DC

## 2021-05-15 MED ORDER — HYDROMORPHONE HCL 1 MG/ML IJ SOLN
0.5000 mg | INTRAMUSCULAR | Status: DC | PRN
  Administered 2021-05-16: 0.5 mg via INTRAVENOUS
  Filled 2021-05-15: qty 0.5

## 2021-05-15 MED ORDER — METOCLOPRAMIDE HCL 5 MG/ML IJ SOLN
5.0000 mg | Freq: Four times a day (QID) | INTRAMUSCULAR | Status: DC
  Administered 2021-05-15 – 2021-05-17 (×7): 5 mg via INTRAVENOUS
  Filled 2021-05-15 (×8): qty 2

## 2021-05-15 MED ORDER — LORAZEPAM 2 MG/ML IJ SOLN
0.5000 mg | Freq: Three times a day (TID) | INTRAMUSCULAR | Status: DC | PRN
  Administered 2021-05-15 – 2021-05-16 (×2): 0.5 mg via INTRAVENOUS
  Filled 2021-05-15 (×2): qty 1

## 2021-05-15 MED ORDER — INSULIN ASPART 100 UNIT/ML IJ SOLN
0.0000 [IU] | Freq: Three times a day (TID) | INTRAMUSCULAR | Status: DC
  Administered 2021-05-16 (×2): 3 [IU] via SUBCUTANEOUS
  Administered 2021-05-16: 11 [IU] via SUBCUTANEOUS
  Administered 2021-05-17: 3 [IU] via SUBCUTANEOUS

## 2021-05-15 MED ORDER — ACETAMINOPHEN 325 MG PO TABS: 650.0000 mg | ORAL_TABLET | Freq: Four times a day (QID) | ORAL | Status: DC | PRN

## 2021-05-15 MED ORDER — INSULIN GLARGINE-YFGN 100 UNIT/ML ~~LOC~~ SOLN
12.0000 [IU] | Freq: Every day | SUBCUTANEOUS | Status: DC
Start: 2021-05-15 — End: 2021-05-17
  Administered 2021-05-15 – 2021-05-16 (×2): 12 [IU] via SUBCUTANEOUS
  Filled 2021-05-15 (×3): qty 0.12

## 2021-05-15 MED ORDER — POTASSIUM CHLORIDE CRYS ER 20 MEQ PO TBCR
40.0000 meq | EXTENDED_RELEASE_TABLET | ORAL | Status: AC
  Administered 2021-05-15: 40 meq via ORAL
  Filled 2021-05-15: qty 2

## 2021-05-15 MED ORDER — METOPROLOL SUCCINATE ER 25 MG PO TB24
12.5000 mg | ORAL_TABLET | Freq: Every day | ORAL | Status: DC
  Administered 2021-05-15 – 2021-05-17 (×2): 12.5 mg via ORAL
  Filled 2021-05-15 (×3): qty 1

## 2021-05-15 MED ORDER — MORPHINE SULFATE (PF) 2 MG/ML IV SOLN
2.0000 mg | Freq: Once | INTRAVENOUS | Status: AC
  Administered 2021-05-15: 2 mg via INTRAVENOUS
  Filled 2021-05-15: qty 1

## 2021-05-15 MED ORDER — MORPHINE SULFATE (PF) 2 MG/ML IV SOLN: 2.0000 mg | Freq: Once | INTRAVENOUS | Status: DC

## 2021-05-15 MED ORDER — INSULIN ASPART 100 UNIT/ML IJ SOLN
0.0000 [IU] | Freq: Every day | INTRAMUSCULAR | Status: DC
  Administered 2021-05-15 – 2021-05-16 (×2): 3 [IU] via SUBCUTANEOUS

## 2021-05-15 MED ORDER — PANTOPRAZOLE SODIUM 40 MG IV SOLR
40.0000 mg | Freq: Two times a day (BID) | INTRAVENOUS | Status: DC
  Administered 2021-05-15 – 2021-05-17 (×4): 40 mg via INTRAVENOUS
  Filled 2021-05-15 (×5): qty 10

## 2021-05-15 MED ORDER — NALOXONE HCL 0.4 MG/ML IJ SOLN: 0.4000 mg | INTRAMUSCULAR | Status: DC | PRN

## 2021-05-15 NOTE — Progress Notes (Addendum)
1115 Pt endorses that when IV doxy was started on R FA IV yesterday morning in 2H that the line started burning almost immediately after start of infusion. Per pt, RN's removed the line, believing IV infiltration, dose was not restarted. Area is currently pink and itchy with small splotches.  ? ?Also per pt, the afternoon dose was administered through her single lumen midline. Moments after infusion was started, pt experienced itching and burning sensation. According to patient, it was so painful she yelled for them to stop the infusion.  ? ?Pt refuses 10 am IVPB dose.  ? ?This RN paged Kyra Searles MD @ 12:24. No response. Amion number confirmed correct, second page sent at 1344. No response received.  ? ?5 - Pharmacy called this RN asking about why IVPB doxy was not administered. RN explained situation and that MD had been paged. Delsa Sale with Pharmacy also endorsed communication issues.  ? ?Approx 1433, provider found on unit by this RN. See new orders. Pt also endorsed significant and abdominal pain. MD ordered CT. ? ?Transport arrived. Pt vomiting. Transport cancelled; CT notified. Will retry in 30 min.  ?

## 2021-05-15 NOTE — Progress Notes (Signed)
Pt to CT

## 2021-05-15 NOTE — Plan of Care (Signed)
  Problem: Activity: Goal: Ability to tolerate increased activity will improve Outcome: Progressing   Problem: Cardiac: Goal: Ability to achieve and maintain adequate cardiopulmonary perfusion will improve Outcome: Progressing   Problem: Health Behavior/Discharge Planning: Goal: Ability to safely manage health-related needs after discharge will improve Outcome: Progressing   

## 2021-05-15 NOTE — Progress Notes (Signed)
? ?NAME:  Jennifer Hogan, MRN:  [id number], DOB:  [birth year], LOS: 4 ?ADMISSION DATE:  05/10/2021, CONSULTATION DATE:  05/13/2021 ?REFERRING MD:  Dr. Marthenia Rolling CHIEF COMPLAINT:  Hypotension  ? ?History of Present Illness:  ?Ms. Jennifer Hogan is a 51 y/o female with a PMHx of T2DM on insulin pump, CAD s/p CABG, HTN, HLD, hypothyroidism presented to the ED with complaints of nausea, vomiting and chest pain.  She also endorsed nonproductive cough, chills, and palpitations.  Patient felt palpitations were related to her vomiting.  Her main concern at the time was that she was developing DKA.  ? ?Blood pressure on admission was noted to be low at 71/57 with oxygen saturations as low as 82% with improvement on initiation of supplemental oxygen via nasal cannula.  White blood count elevated at 14.  Due to this, patient was started on Rocephin and doxycycline for community-acquired pneumonia.  Troponins elevated at 347, which continued to elevate up to a maximum of 1580.  Cardiology was consulted for NSTEMI. ?CBG elevated at 296 with beta hydroxybutyric acid on admission elevated at 1.15.  Urinalysis with ketones.  Patient was maintained on her insulin pump. ? ?During admission, repeat TTE demonstrated reduction in LVEF to 30-35% with regional wall motion abnormalities. LHC on 4/19 with no significant changes compared to most recent LHC in September 2022. Recommended continued medical therapy.  ? ?Overnight, patient was noted to be tachycardic into the 120s. Rhythm sinus per telemetry. This AM, patient was noted to have hypotensive with MAPs between 51-64. She endorses nausea but no vomiting; her abdominal pain seemed improved compared to prior. Initial lab work demonstrated significant metabolic acidemia with bicarb of 13; blood glucose 231. BNP elevated at 2640. CXR demonstrates new multi-focal opacities, worse on the right upper lobe. PCCM consulted for hypotension.  ? ?4/23 PCCM called to assess patient at bedside for  increasing abdominal pain. ? ?Pertinent  Medical History  ?Type 1 diabetes ?CAD s/p CABG (2020) ?HFrEF with recovered EF ?Hypertension ?Hyperlipidemia ?Gastroparesis ?Hypothyroidism on Synthroid ?Depression and anxiety ?THC abuse ? ?Significant Hospital Events: ?Including procedures, antibiotic start and stop dates in addition to other pertinent events   ?4/19: Presented to [facility].  Admitted for NSTEMI.  LHC with no acute changes compared to prior.  Recommended medical management.  TTE with mild reduction in EF from 40 to 45% to 30 to 35% ?4/21: PCCM consulted for hypotension.  Transferred to ICU for shock ?423: pccm consulted to assess patient w/ continued abdominal pain ? ?Interim History / Subjective:  ? ?Patient currently denies any abdominal pain. Was given morphine prior to arrival.  ?Cbg trending up today; Now 344 ?Nurse states patient was adjusting and turning her insulin pump down today ?Patient hypertensive with SBP 150s ? ?Objective   ?Blood pressure (!) 157/97, pulse (!) 141, temperature 98.5 ?F (36.9 ?C), temperature source Oral, resp. rate (!) 31, weight 63.8 kg, SpO2 94 %. ?   ?   ? ?Intake/Output Summary (Last 24 hours) at 05/15/2021 1925 ?Last data filed at 05/15/2021 1529 ?Gross per 24 hour  ?Intake 968.78 ml  ?Output --  ?Net 968.78 ml  ? ? ?Filed Weights  ? 05/13/21 0502 05/14/21 0500 05/15/21 0500  ?Weight: 63.8 kg 63.8 kg 63.8 kg  ? ?Examination: ?General:   NAD; laying in bed ?HEENT: MM pink/dry ?Neuro: Aox3; MAE ?CV: s1s2, tachy 110s, no m/r/g ?PULM:  dim clear BS bilaterally; on room air ?GI: soft, bsx4 active, mild diffuse abd pain w/ palpation ?Extremities:  warm/dry, no edema  ?Skin: no rashes or lesions appreciated ? ?Resolved Hospital Problem list   ?Acute Hypoxic Respiratory Failure ?Diabetic Ketoacidosis, acidosis, resolved ?Hypovolemic Shock: resolved ?Septic shock related to right upper lobe pneumonia: resolved ?Shock state resolved ?Anion Gap Metabolic Acidosis ?Compensatory Respiratory  Alkalosis  ? ?Assessment & Plan:  ? ?Community Acquired Pneumonia: RML consolidation ?Acute Hypoxic Respiratory Failure - Resolved ?History of COPD ?P: ?-Complete 5-day course Rocephin plus doxycycline ?-Continue to wean oxygen to maintain O2 sats greater than 88%. ?-albuterol prn for wheezing ?-cont singulair ?-Pulm toiletry: IS ?-trend CXR per primary ? ?Abdominal Pain ?N/V ?P: ?-per primary ?-check cmp, tsh, ekg ?-prn tylenol for pain ?-cont reglan per primary ?-consider CT abd/pelvis if abd persist ? ?T1DM ?P: ?-per primary ?-trend cbg ?-consider SSI vs. IV insulin with increasing CBG's and stopping insulin pump ? ?SIRS/sepsis ?P: ?-continue rocpehin/doxy 5 day course ?-trend CXR per primary ?-trend wbc/fever curve ?-trend LA and PCT ? ?NSTEMI, cath with clean coronaries, question vasospasm ?Chronic HFrEF (EF 30-35%)  ?CAD s/p CABG ?Hyperlipidemia  ?Long-term history of CAD complicated by ischemic cardiomyopathy with reduced EF in 09/2020. Initially improved but TTE during this admission demonstrates further new reduction in EF, likely due to inadequate GDMT. LHC shows unchanged CAD. Unfortunately, ARB/ARNI and MRA contraindicated due to history of hyperkalemia. SGLT2i contraindicated due to T1DM. Patient has been non-compliant with BB.  ?P: ?-per cardiology and primary ? ?Hypothyroidism  ?Anxiety ?UDS positive benzodiazepines, opiates and THC ?P: ?-per primary ? ?Best Practice (right click and "Reselect all SmartList Selections" daily)  ? ?Per primary ? ? ?JD Rollene Rotunda, PA-C ?[facility] ?05/15/2021, 8:38 PM ? ?Please see [email or web address] for pager details. ? ?From 7A-7P if no response, please call [phone]. ?After hours, please call ELink [phone]. ? ? ?

## 2021-05-15 NOTE — Progress Notes (Signed)
1500 Pt endorsing pain. Jonelle Sidle RN secure chat provider. No new orders.   ? ?1535 Pt endorsing pain. Transport here to take pt to CT. Staff asked if she think she could tolerate the scan without pain meds. She stated "no". Transport cancelled. Provider paged. [phone]. ?

## 2021-05-15 NOTE — Progress Notes (Signed)
Since patient is vomiting, per Dr Marthenia Rolling, ok to do CT scan without oral contrast---CT radiology advised, they will come for patient now ?

## 2021-05-15 NOTE — Progress Notes (Signed)
Verified with patient that all belongings were transferred to room 2C 09.  ?

## 2021-05-15 NOTE — Progress Notes (Signed)
Patient with own insulin pump. Insulin pump being discontinued in leu of POC glucose and sliding scale. Explained to patient; she stated she understood, took the insulin pump off, turned it off and will put it somewhere safe in her belongings. ?

## 2021-05-15 NOTE — Progress Notes (Addendum)
? ?Progress Note ? ?Patient Name: Jennifer Hogan ?Date of Encounter: 05/15/2021 ? ?Primary Cardiologist: Fransico Him, MD ? ?Subjective  ? ?No chest or abdominal discomfort.  Ate well yesterday.  Somewhat anxious about being in the hospital and would like to go home soon. ? ?Inpatient Medications  ?  ?Scheduled Meds: ? aspirin EC  81 mg Oral Daily  ? Chlorhexidine Gluconate Cloth  6 each Topical Daily  ? clopidogrel  75 mg Oral QHS  ? diphenhydrAMINE  25 mg Oral QHS  ? enoxaparin (LOVENOX) injection  40 mg Subcutaneous Q24H  ? insulin pump   Subcutaneous TID WC, HS, 0200  ? levothyroxine  100 mcg Oral QAC breakfast  ? midodrine  5 mg Oral TID WC  ? montelukast  10 mg Oral QHS  ? potassium chloride  40 mEq Oral Q4H  ? sodium chloride flush  3 mL Intravenous Q12H  ? sodium chloride flush  3 mL Intravenous Q12H  ? ?Continuous Infusions: ? sodium chloride Stopped (05/14/21 1738)  ? sodium chloride 250 mL (05/13/21 1205)  ? cefTRIAXone (ROCEPHIN)  IV 2 g (05/15/21 0456)  ? dextrose 5% lactated ringers 75 mL/hr at 05/15/21 0400  ? doxycycline (VIBRAMYCIN) IV Stopped (05/14/21 2345)  ? insulin Stopped (05/14/21 1119)  ? ?PRN Meds: ?sodium chloride, albuterol, alum & mag hydroxide-simeth, budesonide, dextrose, dextrose, FLUoxetine, fluticasone, hydrOXYzine, pantoprazole, sodium chloride flush  ? ?Vital Signs  ?  ?Vitals:  ? 05/15/21 0500 05/15/21 0610 05/15/21 4696 05/15/21 0841  ?BP:   115/80 117/75  ?Pulse:   94 99  ?Resp:  19  20  ?Temp:   97.9 ?F (36.6 ?C) 98.5 ?F (36.9 ?C)  ?TempSrc:   Oral Oral  ?SpO2:   95% 96%  ?Weight: 63.8 kg     ? ? ?Intake/Output Summary (Last 24 hours) at 05/15/2021 0929 ?Last data filed at 05/15/2021 0400 ?Gross per 24 hour  ?Intake 2023.18 ml  ?Output --  ?Net 2023.18 ml  ? ?Filed Weights  ? 05/13/21 0502 05/14/21 0500 05/15/21 0500  ?Weight: 63.8 kg 63.8 kg 63.8 kg  ? ? ?Telemetry  ?  ?Sinus rhythm.  Personally reviewed. ? ?ECG  ?  ?An ECG dated 05/13/2021 was personally reviewed today and  demonstrated:  Sinus tachycardia with LVH and repolarization abnormalities, PVCs. ? ?Physical Exam  ? ?GEN: No acute distress.   ?Neck: No JVD. ?Cardiac: RRR, no murmur, rub, or gallop.  ?Respiratory: Nonlabored. Clear to auscultation bilaterally. ?GI: Soft, nontender, bowel sounds present. ?MS: No edema; No deformity. ?Neuro:  Nonfocal. ?Psych: Alert and oriented x 3. Normal affect. ? ?Labs  ?  ?Chemistry ?Recent Labs  ?Lab 05/11/21 ?2952 05/11/21 ?1355 05/13/21 ?0202 05/13/21 ?1421 05/14/21 ?0056 05/14/21 ?0421 05/15/21 ?0120  ?NA 136   < > 136   < > 136 139 140  ?K 4.0   < > 3.8   < > 3.4* 3.6 3.2*  ?CL 106   < > 109   < > 111 111 112*  ?CO2 22   < > 13*   < > 18* 22 21*  ?GLUCOSE 128*   < > 231*   < > 133* 152* 93  ?BUN 23*   < > 20   < > '13 12 8  '$ ?CREATININE 1.09*   < > 1.08*   < > 0.84 0.90 0.83  ?CALCIUM 9.1   < > 8.3*   < > 7.8* 8.0* 8.1*  ?PROT 7.3  --   --   --   --   --   --   ?  ALBUMIN 3.9  --  3.0*  --   --   --   --   ?AST 44*  --   --   --   --   --   --   ?ALT 19  --   --   --   --   --   --   ?ALKPHOS 71  --   --   --   --   --   --   ?BILITOT 0.7  --   --   --   --   --   --   ?GFRNONAA >60   < > >60   < > >60 >60 >60  ?ANIONGAP 8   < > 14   < > '7 6 7  '$ ? < > = values in this interval not displayed.  ?  ? ?Hematology ?Recent Labs  ?Lab 05/13/21 ?0202 05/14/21 ?0421 05/15/21 ?0120  ?WBC 12.1* 7.6 5.8  ?RBC 3.95 3.08* 3.07*  ?HGB 12.1 9.7* 9.4*  ?HCT 36.0 27.8* 28.1*  ?MCV 91.1 90.3 91.5  ?MCH 30.6 31.5 30.6  ?MCHC 33.6 34.9 33.5  ?RDW 14.9 15.0 15.4  ?PLT 362 358 325  ? ? ?Cardiac Enzymes ?Recent Labs  ?Lab 05/10/21 ?1914 05/10/21 ?2219 05/11/21 ?0747 05/11/21 ?1004  ?TROPONINIHS 347* 979* 1,580* 1,393*  ? ? ?BNP ?Recent Labs  ?Lab 05/11/21 ?8921 05/13/21 ?0202  ?BNP 2,412.6* 2,640.5*  ?  ? ? ?Radiology  ?  ?No results found. ? ?Cardiac Studies  ? ?Cardiac catheterization 05/11/2021: ?  Prox LAD to Mid LAD lesion is 100% stenosed. ?  Dist LAD-1 lesion is 70% stenosed. ?  Dist LAD-2 lesion is 70%  stenosed. ?  Mid LAD lesion is 60% stenosed. ?  Ost Cx to Prox Cx lesion is 99% stenosed. ?  Prox Cx to Mid Cx lesion is 70% stenosed. ?  Ramus-1 lesion is 60% stenosed. ?  Ramus-2 lesion is 20% stenosed. ?  Ramus-3 lesion is 70% stenosed. ?  RPAV lesion is 60% stenosed. ?  Non-stenotic Mid LAD to Dist LAD lesion was previously treated. ?  LIMA graft was visualized by angiography and is normal in caliber. ?  RIMA graft was visualized by non-selective angiography and is normal in caliber. ?  The graft exhibits no disease. ?  LV end diastolic pressure is moderately elevated. ?  ?Severe 2 vessel obstructive CAD ?Patent sequential LIMA graft  to the ramus intermediate and distal LAD ?Patent RIMA to the distal LCx ?Moderately elevated LVEDP ?  ?Compared to September 2022 there is no significant angiographic change. Recommend continued medical therapy.  ? ?Echocardiogram 05/11/2021: ? 1. Diffuse hypokinesis worse in septum and apex . Left ventricular  ?ejection fraction, by estimation, is 30 to 35%. The left ventricle has  ?moderately decreased function. The left ventricle demonstrates global  ?hypokinesis. The left ventricular internal  ?cavity size was severely dilated. Left ventricular diastolic parameters  ?are indeterminate.  ? 2. Right ventricular systolic function is normal. The right ventricular  ?size is normal. There is normal pulmonary artery systolic pressure.  ? 3. The mitral valve is abnormal. Mild mitral valve regurgitation. No  ?evidence of mitral stenosis.  ? 4. The aortic valve is tricuspid. Aortic valve regurgitation is not  ?visualized. No aortic stenosis is present.  ? 5. The inferior vena cava is normal in size with greater than 50%  ?respiratory variability, suggesting right atrial pressure of 3 mmHg.  ? ?Assessment & Plan  ?  ?1.  NSTEMI, likely type II event with peak high-sensitivity troponin high 1580.  No chest pain. ? ?2.  Multivessel CAD status post CABG.  Cardiac catheterization during this  admission shows patent LIMA to ramus intermedius and distal LAD, also patent RIMA to distal circumflex.  No obvious targets as culprit for NSTEMI and plan for medical therapy.  Vasospasm considered. ? ?3.  HFrEF with LVEF 30 to 35% and global hypokinesis, more prominent in the septum and apex. ? ?4.  SIRS/sepsis.  Afebrile and hemodynamically stable.  Antibiotics have been discontinued and she is also off Neo-Synephrine.  Blood cultures are negative so far. ? ?5.  UDS positive for benzodiazepines, opiates, THC. ? ?6.  Mixed hyperlipidemia with multi statin intolerance.  Previously on Praluent as an outpatient. ? ?7.  Type 1 diabetes mellitus, on insulin pump as an outpatient. ? ?Clinically more stable, blood pressure also improved although now on midodrine.  Continue aspirin and Plavix.  Per chart review she has a prior history of hyperkalemia on Aldactone, ARB has been held for this reason as well and she is not a candidate for SGLT2 inhibitor due to type 1 diabetes mellitus.  Will try and reinitiate low-dose Toprol-XL.  Once she is ultimately discharged she will need to have follow-up in the heart failure TOC clinic, sees Dr. Haroldine Laws. ? ?Signed, ?Rozann Lesches, MD  ?05/15/2021, 9:29 AM    ?

## 2021-05-15 NOTE — Progress Notes (Signed)
?PROGRESS NOTE ? ? ? ?Jennifer Hogan  JQB:[id number] DOB: 01/21/1971 DOA: 05/10/2021 ?PCP: [name], NP  ?Outpatient Specialists:  ? ? ? ?Brief Narrative:  ?Patient is a 51 year old Caucasian female past medical history significant for hypertension, hyperlipidemia, coronary artery disease status post CABG, diabetes mellitus type 1 on insulin pump, hypothyroidism and GERD.  Patient was admitted with chest pain, with associated significant GI symptoms.  Patient reported nausea and vomiting since the last 2 to 3 days.  Patient has undergone cardiac catheterization without significant finding. ? ?05/12/2021: Patient seen.  Last nausea vomiting was this morning.  Patient continues to report abdominal discomfort.  Patient is asking for some antacid.  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  We will repeat BMP stat to ensure accuracy of the lab result. ? ?05/13/2021: Patient remains tachycardic overnight necessitating 12.5 mg of beta-blocker.  Patient was also given Norvasc 2.5 Mg p.o. x1 dose.  Patient has been hypotensive, with systolic blood pressure in the 60s.  Patient is more acidotic.  Chest x-ray revealed widespread patchy infiltrate, worrisome for pneumonia, pulmonary edema possibly noncardiac.  Low EF is noted.  Patient has had abdominal symptoms with nausea and vomiting.  Leukocytosis is improving.  Patient is on IV ceftriaxone and doxycycline.  CO2 was just last 13 days morning.  Stat ABG revealed pH of 7.43, PCO2 of 22 and PO2 of 101 (suggestive of metabolic acidosis with possible respiratory alkalosis).  Patient was seen emergently.  IV bicarb given.  Start patient on bicarb drip.  We will panculture patient.  ICU team consulted to assume care.  Patient will be transferred to the ICU team for further management.  Worsening renal function noted this morning.  No documented fever. ? ?05/14/2021: Patient seen.  Patient has been transferred back to the hospitalist team  today.  Acidosis has resolved significantly.  Etiology of the acidosis remains unclear.  Not certain how much Tylenol patient has been taking.  Overall, patient looks much better. ? ?05/15/2021: Patient seen.  Patient is having nausea and vomiting.  Patient has history of diabetic gastroparesis.  Start patient on IV Reglan.  Patient reports vague abdominal pain.  We will start IV Protonix.  We will proceed with CT scan of the chest and abdomen.  Repeat chest x-ray done on 05/13/2021 noted.  We will also repeat lactic acid level.  Above plans discussed with the patient's nursing team. ? ? ?Assessment & Plan: ?  ?Principal Problem: ?  NSTEMI (non-ST elevated myocardial infarction) ([place]) ?Active Problems: ?  Hypothyroidism ?  Type 1 diabetes mellitus ([place]) ?  SIRS (systemic inflammatory response syndrome) ([facility]) ?  CAD S/P percutaneous coronary angioplasty ?  Marijuana abuse ?  Hyperlipidemia LDL goal <70 ?  Heart failure with reduced ejection fraction ([place]) ?  Acute respiratory failure with hypoxia ([place]) ? ? ?NSTEMI CAD ?-Patient has had cardiac catheterization without significant finding. ?-Cardiology team is directing.   ?-Medical management for now.    ?  ?SIRS/sepsis ?-Persistent tachycardia overnight. ?-Hypertension, with systolic blood pressure in the 60s. ?-Elevated procalcitonin. ?-Worsening metabolic acidosis. ?-Mild worsening of renal function. ?-Worsening chest x-ray findings as documented above. ?-However, leukocytosis is improving. ?-Panculture patient. ?-Aggressive resuscitation. ?-Transfer to ICU for further management. ?05/14/2021: Transfer back to the hospitalist team today.  Patient looks much better.  Sepsis physiology has resolved significantly. ?05/15/2021: Continue antibiotics.  Due to line infiltration, doxycycline will be changed to oral.  Proceed with CT scan of the  chest. ?  ?Acute respiratory failure with hypoxia Heart failure with reduced EF  ?-Patient is requiring supplemental oxygen. ?-Rule  out noncardiogenic pulmonary edema.  Hypotension ?-Hypotension has resolved. ?-Last blood pressure 129/79 mmHg  ?05/14/2021: Resolving.   ? ?Severe metabolic acidosis: ?-Etiology unclear. ?-DC Tylenol. ?-Patient is a diabetic, however, blood she has no significant elevated. ?-1 amp of sodium bicarbonate (25 mEq) x1 dose given. ?-Bicarb drip. ?-Continue to work-up for possible etiology. ?-Lactic acid has ranged from 1.7-2.2. ?05/14/2021: Resolved significantly.  Etiology unclear. ?05/15/2021: CO2 today is 21.  Patient is having abdominal pain.  We will check lactic acid and CT abdomen and pelvis. ? ?Mild respiratory alkalosis: ?-Monitor patient's anxiety. ?-Continue to monitor closely. ?  ?Hyperlipidemia ?-Continue statins.   ?      ?Diabetes mellitus type 1, with hypoglycemia ?Patient on insulin pump.  On admission initial blood sugar was noted to be 62.  Last available hemoglobin A1c 8.7 on 03/25/2021.  Patient requested to manage pump. ?-Insulin pump order set utilized ?-Hypoglycemic protocols with D50 as needed for low blood sugars ?-CBGs before every meal ?  ?Hypothyroidism ?Home regimen includes levothyroxine 100 mcg daily.  Last TSH 0.737 on 03/25/2021. ?-Continue levothyroxine ?  ?Anxiety ?Medication regimen includes Prozac 40 mg daily as needed. ?-Will need to discussed that this is not a as needed medication and needs to be taken on a consistent basis to have benefit ?  ?Marijuana abuse ? ?Persistent nausea and vomiting: ?-Rule out gastroparesis. ?-Improved significantly with IV Reglan. ?-No further nausea or vomiting reported.   ?05/14/2021: Resolved. ?06/11/2021: Patient has a history of diabetes gastroparesis.  We will start patient on IV Reglan 5 mg Q systolic. ? ?DVT prophylaxis: Lovenox 40 Mg once daily. ?Code Status: Full code ?Family Communication:  ?Disposition Plan:  ? ? ?Consultants:  ?Cardiology. ? ?Procedures:  ?Cardiac catheterization ? ?Antimicrobials:  ?IV Rocephin ?IV  doxycycline ? ? ?Subjective: ?Patient has nausea vomiting. ?Vague abdominal pain. ? ?Objective: ?Vitals:  ? 05/15/21 0611 05/15/21 0841 05/15/21 1142 05/15/21 1207  ?BP: 115/80 117/75 129/76 129/76  ?Pulse: 94 99 (!) 101 94  ?Resp:  20  19  ?Temp: 97.9 ?F (36.6 ?C) 98.5 ?F (36.9 ?C)    ?TempSrc: Oral Oral    ?SpO2: 95% 96%  96%  ?Weight:      ? ? ?Intake/Output Summary (Last 24 hours) at 05/15/2021 1608 ?Last data filed at 05/15/2021 1529 ?Gross per 24 hour  ?Intake 1196.98 ml  ?Output --  ?Net 1196.98 ml  ? ? ?Filed Weights  ? 05/13/21 0502 05/14/21 0500 05/15/21 0500  ?Weight: 63.8 kg 63.8 kg 63.8 kg  ? ? ?Examination: ? ?General exam: Awake and alert.       ?Respiratory system: Clear to auscultation.  ?Cardiovascular system: S1 & S2  ?Gastrointestinal system: Vague abdominal discomfort.   ?Central nervous system: Alert and oriented. No focal neurological deficits. ?Extremities: No leg edema. ? ?Data Reviewed: I have personally reviewed following labs and imaging studies ? ?CBC: ?Recent Labs  ?Lab 05/10/21 ?1914 05/10/21 ?1948 05/11/21 ?1610 05/11/21 ?1355 05/12/21 ?0233 05/13/21 ?0202 05/14/21 ?0421 05/15/21 ?0120  ?WBC 14.7*  --  13.5*  --  18.4* 12.1* 7.6 5.8  ?NEUTROABS 13.5*  --   --   --   --  10.9*  --   --   ?HGB 13.3   < > 12.5 11.9* 13.1 12.1 9.7* 9.4*  ?HCT 40.1   < > 36.9 35.0* 39.0 36.0 27.8* 28.1*  ?MCV 91.3  --  91.3  --  91.8 91.1 90.3 91.5  ?PLT 518*  --  449*  --  421* 362 358 325  ? < > = values in this interval not displayed.  ? ? ?Basic Metabolic Panel: ?Recent Labs  ?Lab 05/13/21 ?0202 05/13/21 ?1421 05/13/21 ?1801 05/13/21 ?1610 05/14/21 ?0056 05/14/21 ?0421 05/15/21 ?0120  ?NA 136   < > 138 137 136 139 140  ?K 3.8   < > 3.1* 3.4* 3.4* 3.6 3.2*  ?CL 109   < > 108 108 111 111 112*  ?CO2 13*   < > 20* 20* 18* 22 21*  ?GLUCOSE 231*   < > 98 133* 133* 152* 93  ?BUN 20   < > '15 15 13 12 8  '$ ?CREATININE 1.08*   < > 0.97 0.95 0.84 0.90 0.83  ?CALCIUM 8.3*   < > 8.5* 8.2* 7.8* 8.0* 8.1*  ?MG 1.9  --    --   --   --   --   --   ?PHOS 1.7*  --   --   --   --   --   --   ? < > = values in this interval not displayed.  ? ? ?GFR: ?Estimated Creatinine Clearance: 71.3 mL/min (by C-G formula based on SCr of 0.83 mg/dL). ?Liver Function Tests:

## 2021-05-15 NOTE — Progress Notes (Signed)
Date and time results received: 05/15/21 1731 ? ?Test: Lactic ?Critical Value: 2.5 ?Name of Provider Notified: S. Ogbata via [facility] page. Awaiting callback. ? ?

## 2021-05-15 NOTE — Plan of Care (Signed)
?  Problem: Activity: ?Goal: Ability to tolerate increased activity will improve ?Outcome: Progressing ?  ?Problem: Cardiac: ?Goal: Ability to achieve and maintain adequate cardiopulmonary perfusion will improve ?Outcome: Progressing ?  ?Problem: Metabolic: ?Goal: Ability to maintain appropriate glucose levels will improve ?Outcome: Progressing ?  ?Problem: Coping: ?Goal: Level of anxiety will decrease ?Outcome: Progressing ?  ?Problem: Pain Managment: ?Goal: General experience of comfort will improve ?Outcome: Progressing ?  ?

## 2021-05-15 NOTE — Progress Notes (Addendum)
Pt endorsed that she believes she is allergic to doxy IV. She had two reactions while in 2 heart. Asked RN to page DR. RN paged Kyra Searles MD 2 [phone]  with no response. Pharmacy and CT called RN endorsing inability to get ahold of provider. ?

## 2021-05-15 NOTE — Progress Notes (Addendum)
1500: Tamara RN asked by this RN to check on patient and status of nausea post reglan administration. Pt found to be endorsing significant left quadrant pain. MD Ogbata secure chatted at 1504, per Natale Lay RN.  [phone] MD Obgata replied to Jonelle Sidle RN to do the CT.  ? ?1534: MD Ogbata secure chat sent by this RN, Charge RN Natale Lay added to chat: E Aylee Littrell 1554:"Pt still endorsing pain, doesn't think she's going to be able to sit still for CT. We asked if she think she could tolerate the scan without pain meds. She stated "no". Transport cancelled. Please confirm that the number in [facility] is correct. I have paged your listed number twice, pharmacy paged once, and CT once. [phone]  is on file with [facility]."  ? ?Secure Chat conversation - S. Ogbata 1556: "You asked her and she said no!! Where is she hurting please?" ? ?Secure Chat conversation - T Williams 1605: "left upper and left lower quadrant. this is Actuary, I am helping Kirstyn Lean RN ? ?Secure Chat conversation - S Ogbata 1606: "She needs a CT Scan to see if we can find what the problem may be. Check lactic acid. IV Reglan and PPI. Thanks for helping out Danae Chen" ? ?Secure Chat conversation - E Osie Merkin 1611: "Pt has had IV reglan, and ativan. over an hour ago, when pages were sent. Please CALL ME> put HR is high, BP is high. she is PALE. [phone]" ? ?1612Marthenia Rolling MD calls [facility] RN from [place]. RN informs MD that pt is pale, double over in pain, extremely hypertensive and tachycardic in the 140-150s. She is requiring O2 via [place] at 2L to maintain a 95% sat with good pleth. RN states that pt is in too much pain to go to CT and requests pain medications. MD is insistent that patient is suffering from diabetic gastroparesis and that pain meds will not help. MD states "What do you want to go, give her morphine?" And (paraphrase) zonk her out? Rn states that she didn't say morphine, but that patient doesn't even have tylenol on file to give. Rn again request pain  meds. MD states that pt needs IV protonix and the CT scan to figure out what is wrong. RN endorses that she agrees with his assessment of need for CT, but that pt is in excruciating pain and will not be able to tolerate the scan.  Provider then goes on to condescendingly explain that in order to treat pain you have to know the pathophysiology and treat the cause. RN asserts that patient needs pain meds to be able to stabilize and tolerate scan. MD states "FINE, Give '2mg'$  morphine IV push once." Rn "okay". RN prepares to read back the verbal order for closed loop communication. Phone conversation is ended by provider. RN Proofreader of inability to perform verbal read back to provider. Charge RN checks this RNs entered verbal order and states to call Pharmacy to verify that med and dosage are approriate.  ? ?2778: Lisa with pharmacy contacted by Designer, multimedia via phone. Situation explained. RN Kaylum Shrum verbalizes order given to her by provider and if order is "appropriate" Pharmacist states it is and she will verify it immediately. Med administered. See MAR. ? ?2423 CT informed of situation that pt vitals and pain need to stabilize prior to sending transport again for scan. RN states she is not optimistic that will happen prior to 1900.  ? ?1654 - [facility] called. RN explained  patient situation, that provider had not yet responded. RN asked that [facility] RN come lay eyes on the patient, that patient was currently stable appearing,  but that in the even that things escalated in the near future they would be aware of situation. [facility] endorsed he was finishing up with a situation, but would be in route shortly.  ? ?1733 - Critical result called from lab.  Lactic 2.5 had resulted. Kyra Searles MD paged.  ? ?1745 (approximately) - [facility] RN present on floor. Notified of lactic values face- to face. Reviewed patient summary with this RN looking for new orders regarding critical lactic.   No new orders at this time.  ? ?49 -  RN advised by Agricultural consultant to escalate situation to Director on Call. ? ?1759 Cleotis Nipper , Director on-call notified of lack of provider response. States she will try to reach out to provider. ? ?32 Reyna, Director on call: returns call to RN - unable to contact Kyra Searles MD. Advised RN that if RN felt patient status was of concern, to call [facility] and ask them to re-evaluate patient's appropriateness for progressive Care floor. RN expresses need to alert Therapist, sports. Cleotis Nipper states she will touch base with unit director tomorrow (Monday).   ? ?1823 - [facility] RN Saralyn Pilar called by RN. Updated on situation, asked to circle back and that despite pain level now zero, pt's vitals remain unstable.  ? ?1952 - [facility] called. Update on ETA requested. RN informed that Mindy with [facility] was aware of the situation and would be circling back. ? ?2100 John, AC, rounding to unit. Aware of situation ? ?

## 2021-05-16 LAB — RENAL FUNCTION PANEL
Albumin: 3 g/dL — ABNORMAL LOW (ref 3.5–5.0)
Anion gap: 11 (ref 5–15)
BUN: 9 mg/dL (ref 6–20)
CO2: 22 mmol/L (ref 22–32)
Calcium: 8.6 mg/dL — ABNORMAL LOW (ref 8.9–10.3)
Chloride: 103 mmol/L (ref 98–111)
Creatinine, Ser: 0.87 mg/dL (ref 0.44–1.00)
GFR, Estimated: >60 mL/min (ref >60)
Glucose, Bld: 261 mg/dL — ABNORMAL HIGH (ref 70–99)
Phosphorus: 3.5 mg/dL (ref 2.5–4.6)
Potassium: 4.8 mmol/L (ref 3.5–5.1)
Sodium: 136 mmol/L (ref 135–145)

## 2021-05-16 LAB — CBC
HCT: 34.8 % — ABNORMAL LOW (ref 36.0–46.0)
Hemoglobin: 11.5 g/dL — ABNORMAL LOW (ref 12.0–15.0)
MCH: 30.2 pg (ref 26.0–34.0)
MCHC: 33 g/dL (ref 30.0–36.0)
MCV: 91.3 fL (ref 80.0–100.0)
Platelets: 442 10*3/uL — ABNORMAL HIGH (ref 150–400)
RBC: 3.81 MIL/uL — ABNORMAL LOW (ref 3.87–5.11)
RDW: 15.3 % (ref 11.5–15.5)
WBC: 9.5 10*3/uL (ref 4.0–10.5)
nRBC: 0 % (ref 0.0–0.2)

## 2021-05-16 LAB — LACTIC ACID, PLASMA
Lactic Acid, Venous: 2.6 mmol/L (ref 0.5–1.9)
Lactic Acid, Venous: 1.9 mmol/L (ref 0.5–1.9)

## 2021-05-16 LAB — GLUCOSE, CAPILLARY
Glucose-Capillary: 281 mg/dL — ABNORMAL HIGH (ref 70–99)
Glucose-Capillary: 302 mg/dL — ABNORMAL HIGH (ref 70–99)
Glucose-Capillary: 133 mg/dL — ABNORMAL HIGH (ref 70–99)
Glucose-Capillary: 155 mg/dL — ABNORMAL HIGH (ref 70–99)
Glucose-Capillary: 292 mg/dL — ABNORMAL HIGH (ref 70–99)

## 2021-05-16 LAB — STREP PNEUMONIAE URINARY ANTIGEN: Strep Pneumo Urinary Antigen: NEGATIVE

## 2021-05-16 LAB — MAGNESIUM: Magnesium: 1.6 mg/dL — ABNORMAL LOW (ref 1.7–2.4)

## 2021-05-16 MED ORDER — MAGNESIUM SULFATE 2 GM/50ML IV SOLN
2.0000 g | Freq: Once | INTRAVENOUS | Status: AC
Start: 2021-05-16 — End: 2021-05-16
  Administered 2021-05-16: 2 g via INTRAVENOUS
  Filled 2021-05-16: qty 50

## 2021-05-16 MED ORDER — SODIUM CHLORIDE 0.9 % IV BOLUS
500.0000 mL | Freq: Once | INTRAVENOUS | Status: AC
  Administered 2021-05-16: 500 mL via INTRAVENOUS

## 2021-05-16 NOTE — Progress Notes (Signed)
?PROGRESS NOTE ? ? ? ?Jennifer Hogan  EXH:[id number] DOB: 06-Dec-1970 DOA: 05/10/2021 ?PCP: [name], NP  ?Outpatient Specialists:  ? ? ? ?Brief Narrative:  ?Patient is a 51 year old Caucasian female past medical history significant for hypertension, hyperlipidemia, coronary artery disease status post CABG, diabetes mellitus type 1 on insulin pump, hypothyroidism and GERD.  Patient was admitted with chest pain, with associated significant GI symptoms.  Patient reported nausea and vomiting since the last 2 to 3 days.  Patient has undergone cardiac catheterization without significant finding. ? ?05/12/2021: Patient seen.  Last nausea vomiting was this morning.  Patient continues to report abdominal discomfort.  Patient is asking for some antacid.  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  We will repeat BMP stat to ensure accuracy of the lab result. ? ?05/13/2021: Patient remains tachycardic overnight necessitating 12.5 mg of beta-blocker.  Patient was also given Norvasc 2.5 Mg p.o. x1 dose.  Patient has been hypotensive, with systolic blood pressure in the 60s.  Patient is more acidotic.  Chest x-ray revealed widespread patchy infiltrate, worrisome for pneumonia, pulmonary edema possibly noncardiac.  Low EF is noted.  Patient has had abdominal symptoms with nausea and vomiting.  Leukocytosis is improving.  Patient is on IV ceftriaxone and doxycycline.  CO2 was just last 13 days morning.  Stat ABG revealed pH of 7.43, PCO2 of 22 and PO2 of 101 (suggestive of metabolic acidosis with possible respiratory alkalosis).  Patient was seen emergently.  IV bicarb given.  Start patient on bicarb drip.  We will panculture patient.  ICU team consulted to assume care.  Patient will be transferred to the ICU team for further management.  Worsening renal function noted this morning.  No documented fever. ? ?05/14/2021: Patient seen.  Patient has been transferred back to the hospitalist team  today.  Acidosis has resolved significantly.  Etiology of the acidosis remains unclear.  Not certain how much Tylenol patient has been taking.  Overall, patient looks much better. ? ?05/15/2021: Patient seen.  Patient is having nausea and vomiting.  Patient has history of diabetic gastroparesis.  Start patient on IV Reglan.  Patient reports vague abdominal pain.  We will start IV Protonix.  We will proceed with CT scan of the chest and abdomen.  Repeat chest x-ray done on 05/13/2021 noted.  We will also repeat lactic acid level.  Above plans discussed with the patient's nursing team. ? ?05/16/2021: Patient seen.  Critical care input is appreciated.  Documentation from both the critical care team and the hospitalist covering night is highly appreciated.  Patient improved significantly with the management plan outlined yesterday.  CT scan of the chest, abdomen and pelvis is still pending.  Patient feels a lot better today.  Patient was quite appreciative of the care given to her yesterday. ? ?No new complaints.  No nausea or vomiting.  No pain endorsed. ? ? ?Assessment & Plan: ?  ?Principal Problem: ?  NSTEMI (non-ST elevated myocardial infarction) ([place]) ?Active Problems: ?  Hypothyroidism ?  Type 1 diabetes mellitus ([place]) ?  SIRS (systemic inflammatory response syndrome) ([facility]) ?  CAD S/P percutaneous coronary angioplasty ?  Marijuana abuse ?  Hyperlipidemia LDL goal <70 ?  Heart failure with reduced ejection fraction ([place]) ?  Acute respiratory failure with hypoxia ([place]) ?  Community acquired pneumonia ? ? ?NSTEMI CAD ?-Patient has had cardiac catheterization without significant finding. ?-Cardiology team is directing.   ?-Medical management for now.    ?  ?  SIRS/sepsis ?-Persistent tachycardia overnight. ?-Hypertension, with systolic blood pressure in the 60s. ?-Elevated procalcitonin. ?-Worsening metabolic acidosis. ?-Mild worsening of renal function. ?-Worsening chest x-ray findings as documented above. ?-However,  leukocytosis is improving. ?-Panculture patient. ?-Aggressive resuscitation. ?-Transfer to ICU for further management. ?05/14/2021: Transfer back to the hospitalist team today.  Patient looks much better.  Sepsis physiology has resolved significantly. ?05/15/2021: Continue antibiotics.  Due to line infiltration, doxycycline will be changed to oral.  Proceed with CT scan of the chest. ?05/16/2021: Pursue imaging studies.  Complete course of antibiotics.  Sepsis physiology has resolved.  Patient remained stable. ?  ?Acute respiratory failure with hypoxia Heart failure with reduced EF  ?-Patient is requiring supplemental oxygen. ?-Rule out noncardiogenic pulmonary edema.  Hypotension ?-Hypotension has resolved. ?-Last blood pressure 129/79 mmHg  ?05/14/2021: Resolving.   ?05/16/2021: Resolved. ? ?Severe metabolic acidosis: ?-Etiology unclear. ?-DC Tylenol. ?-Patient is a diabetic, however, blood she has no significant elevated. ?-1 amp of sodium bicarbonate (25 mEq) x1 dose given. ?-Bicarb drip. ?-Continue to work-up for possible etiology. ?-Lactic acid has ranged from 1.7-2.2. ?05/14/2021: Resolved significantly.  Etiology unclear. ?05/15/2021: CO2 today is 21.  Patient is having abdominal pain.  We will check lactic acid and CT abdomen and pelvis. ?05/16/2021: CO2 is 22 today.  Continue to monitor closely.  Lactic acid was 2.6 today.  Repeat lactic acid at 8:39 AM was 1.9. ? ?Mild respiratory alkalosis: ?-Monitor patient's anxiety. ?-Continue to monitor closely. ?  ?Hyperlipidemia ?-Continue statins.   ?      ?Diabetes mellitus type 1, with hypoglycemia ?Patient on insulin pump.  On admission initial blood sugar was noted to be 62.  Last available hemoglobin A1c 8.7 on 03/25/2021.  Patient requested to manage pump. ?-Insulin pump order set utilized ?-Hypoglycemic protocols with D50 as needed for low blood sugars ?-CBGs before every meal ?  ?Hypothyroidism ?Home regimen includes levothyroxine 100 mcg daily.  Last TSH 0.737 on  03/25/2021. ?-Continue levothyroxine ?  ?Anxiety ?Medication regimen includes Prozac 40 mg daily as needed. ?-Will need to discussed that this is not a as needed medication and needs to be taken on a consistent basis to have benefit ?  ?Marijuana abuse ? ?Persistent nausea and vomiting: ?-Rule out gastroparesis. ?-Improved significantly with IV Reglan. ?-No further nausea or vomiting reported.   ?05/14/2021: Resolved. ?05/15/2021: Patient has a history of diabetes gastroparesis.  We will start patient on IV Reglan 5 mg Q systolic. ?05/16/2021: Resolved.  Likely secondary to gastroparesis.  Optimize diabetes mellitus management.  Follow-up with GI team on discharge (patient already has an appointment with the GI team).  ? ?DVT prophylaxis: Lovenox 40 Mg once daily. ?Code Status: Full code ?Family Communication:  ?Disposition Plan:  ? ? ?Consultants:  ?Cardiology. ?ICU team ? ?Procedures:  ?Cardiac catheterization ? ?Antimicrobials:  ?IV Rocephin ?Doxycycline ? ? ?Subjective: ?No new complaints today. ?No nausea or vomiting ?No abdominal pain. ? ?Objective: ?Vitals:  ? 05/15/21 2330 05/16/21 0245 05/16/21 0800 05/16/21 1156  ?BP: (!) 149/93 (!) 1'44/95 90/63 90/62 '$  ?Pulse: (!) 121 (!) 120 99 89  ?Resp: '20 20 15 17  '$ ?Temp: 98.4 ?F (36.9 ?C) 98.5 ?F (36.9 ?C) 97.8 ?F (36.6 ?C) 97.7 ?F (36.5 ?C)  ?TempSrc: Oral Oral Oral Oral  ?SpO2: 93% 95% 95% 96%  ?Weight:  67.9 kg    ? ? ?Intake/Output Summary (Last 24 hours) at 05/16/2021 1414 ?Last data filed at 05/16/2021 1209 ?Gross per 24 hour  ?Intake 306 ml  ?Output --  ?Net  306 ml  ? ? ?Filed Weights  ? 05/14/21 0500 05/15/21 0500 05/16/21 0245  ?Weight: 63.8 kg 63.8 kg 67.9 kg  ? ? ?Examination: ? ?General exam: Awake and alert.       ?Respiratory system: Clear to auscultation.  ?Cardiovascular system: S1 & S2  ?Gastrointestinal system: Vague abdominal discomfort.   ?Central nervous system: Alert and oriented. No focal neurological deficits. ?Extremities: No leg edema. ? ?Data  Reviewed: I have personally reviewed following labs and imaging studies ? ?CBC: ?Recent Labs  ?Lab 05/10/21 ?1914 05/10/21 ?1948 05/13/21 ?0202 05/14/21 ?0421 05/15/21 ?0120 05/15/21 ?2132 05/16/21 ?0015  ?WBC 14.7

## 2021-05-16 NOTE — Progress Notes (Signed)
Pharmacist Heart Failure Core Measure Documentation ? ?Assessment: ?Jennifer Hogan has an EF documented as 30-35% on 05/11/21 by ECHO. ? ?Rationale: Heart failure patients with left ventricular systolic dysfunction (LVSD) and an EF < 40% should be prescribed an angiotensin converting enzyme inhibitor (ACEI) or angiotensin receptor blocker (ARB) at discharge unless a contraindication is documented in the medical record. ? ?This patient is not currently on an ACEI or ARB for HF. ? ?This note is being placed in the record in order to provide documentation that a contraindication to the use of these agents is present for this encounter. ? ?ACE Inhibitor or Angiotensin Receptor Blocker is contraindicated ?(specify all that apply) ? ?'[]'$   ACEI allergy AND ARB allergy ?'[]'$   Angioedema ?'[]'$   Moderate or severe aortic stenosis ?'[x]'$   Hyperkalemia ?'[]'$   Hypotension ?'[]'$   Renal artery stenosis ?'[]'$   Worsening renal function, preexisting renal disease or dysfunction ? ? ?Renold Genta, PharmD, BCPS 3:01 PM ? ?

## 2021-05-16 NOTE — Progress Notes (Signed)
? ?Progress Note ? ?Patient Name: Jennifer Hogan ?Date of Encounter: 05/16/2021 ? ?[facility] Cardiologist: Fransico Him, MD  ?Advanced heart failure: Dr. Haroldine Laws ? ?Subjective  ? ?Had eventful 24 hours, reviewed with nursing team who cared for her. Had significant issues with pain/nausea, with hypertension and tachycardia. Rapid response called. Lactate 2.5, given IV fluids. Also noted issues with glucose control, rising glucose levels despite insulin pump basal doses adjusted per protocol. Based on this, insulin pump discontinued and changed to manual insulin with doses per protocol, see note from Dr. Velia Meyer. She was also ordered PRN pain medication. Yesterday there was also a concern for prior reaction to IV doxycycline. Has since tolerated oral doxycycline. ? ?Slept most of the day. This evening she is doing much better. Able to tolerate dinner, though notes that she has to watch quantity to not affect her GI system.  ? ?Inpatient Medications  ?  ?Scheduled Meds: ? aspirin EC  81 mg Oral Daily  ? Chlorhexidine Gluconate Cloth  6 each Topical Daily  ? clopidogrel  75 mg Oral QHS  ? diphenhydrAMINE  25 mg Oral QHS  ? doxycycline  100 mg Oral Q12H  ? enoxaparin (LOVENOX) injection  40 mg Subcutaneous Q24H  ? insulin aspart  0-15 Units Subcutaneous TID WC  ? insulin aspart  0-5 Units Subcutaneous QHS  ? insulin glargine-yfgn  12 Units Subcutaneous QHS  ? levothyroxine  100 mcg Oral QAC breakfast  ? metoCLOPramide (REGLAN) injection  5 mg Intravenous Q6H  ? metoprolol succinate  12.5 mg Oral Daily  ? montelukast  10 mg Oral QHS  ?  morphine injection  2 mg Intravenous Once  ? pantoprazole (PROTONIX) IV  40 mg Intravenous Q12H  ? sodium chloride flush  3 mL Intravenous Q12H  ? sodium chloride flush  3 mL Intravenous Q12H  ? ?Continuous Infusions: ? sodium chloride Stopped (05/14/21 1738)  ? sodium chloride 250 mL (05/13/21 1205)  ? dextrose 5% lactated ringers Stopped (05/15/21 0700)  ? ?PRN Meds: ?sodium  chloride, acetaminophen, albuterol, alum & mag hydroxide-simeth, budesonide, dextrose, dextrose, FLUoxetine, fluticasone, HYDROmorphone (DILAUDID) injection, hydrOXYzine, LORazepam, naLOXone (NARCAN)  injection, sodium chloride flush  ? ?Vital Signs  ?  ?Vitals:  ? 05/16/21 0245 05/16/21 0800 05/16/21 1156 05/16/21 1709  ?BP: (!) 1'44/95 90/63 90/62 '$ 103/70  ?Pulse: (!) 120 99 89 82  ?Resp: '20 15 17 16  '$ ?Temp: 98.5 ?F (36.9 ?C) 97.8 ?F (36.6 ?C) 97.7 ?F (36.5 ?C)   ?TempSrc: Oral Oral Oral   ?SpO2: 95% 95% 96% 95%  ?Weight: 67.9 kg     ? ? ?Intake/Output Summary (Last 24 hours) at 05/16/2021 1830 ?Last data filed at 05/16/2021 1209 ?Gross per 24 hour  ?Intake 300 ml  ?Output --  ?Net 300 ml  ? ? ?  05/16/2021  ?  2:45 AM 05/15/2021  ?  5:00 AM 05/14/2021  ?  5:00 AM  ?Last 3 Weights  ?Weight (lbs) 149 lb 11.1 oz 140 lb 10.5 oz 140 lb 10.5 oz  ?Weight (kg) 67.9 kg 63.8 kg 63.8 kg  ?   ? ?Telemetry  ?  ?Sinus tach - Personally Reviewed ? ?ECG  ?  ?05/15/21 sinus tach at 117 bpm, old anteroseptal infarct - Personally Reviewed ? ?Physical Exam  ? ?GEN: No acute distress.   ?Neck: No JVD ?Cardiac: RRR, no murmurs, rubs, or gallops.  ?Respiratory: Clear to auscultation bilaterally. ?GI: Soft, nontender, non-distended  ?MS: No edema; No deformity. ?Neuro:  Nonfocal  ?  Psych: Normal affect  ? ?Labs  ?  ?High Sensitivity Troponin:   ?Recent Labs  ?Lab 05/10/21 ?1914 05/10/21 ?2219 05/11/21 ?0747 05/11/21 ?1004  ?TROPONINIHS 347* 979* 1,580* 1,393*  ?   ?Chemistry ?Recent Labs  ?Lab 05/11/21 ?0932 05/11/21 ?1355 05/13/21 ?0202 05/13/21 ?1421 05/15/21 ?0120 05/15/21 ?2132 05/16/21 ?0015  ?NA 136   < > 136   < > 140 136 136  ?K 4.0   < > 3.8   < > 3.2* 4.4 4.8  ?CL 106   < > 109   < > 112* 105 103  ?CO2 22   < > 13*   < > 21* 19* 22  ?GLUCOSE 128*   < > 231*   < > 93 273* 261*  ?BUN 23*   < > 20   < > '8 11 9  '$ ?CREATININE 1.09*   < > 1.08*   < > 0.83 0.88 0.87  ?CALCIUM 9.1   < > 8.3*   < > 8.1* 8.4* 8.6*  ?MG  --   --  1.9  --   --   --   1.6*  ?PROT 7.3  --   --   --   --  6.6  --   ?ALBUMIN 3.9  --  3.0*  --   --  3.2* 3.0*  ?AST 44*  --   --   --   --  24  --   ?ALT 19  --   --   --   --  15  --   ?ALKPHOS 71  --   --   --   --  70  --   ?BILITOT 0.7  --   --   --   --  0.5  --   ?GFRNONAA >60   < > >60   < > >60 >60 >60  ?ANIONGAP 8   < > 14   < > '7 12 11  '$ ? < > = values in this interval not displayed.  ?  ?Lipids  ?Recent Labs  ?Lab 05/11/21 ?3557  ?CHOL 258*  ?TRIG 64  ?HDL 78  ?LDLCALC 167*  ?CHOLHDL 3.3  ?  ?Hematology ?Recent Labs  ?Lab 05/15/21 ?0120 05/15/21 ?2132 05/16/21 ?0015  ?WBC 5.8 9.9 9.5  ?RBC 3.07* 3.86* 3.81*  ?HGB 9.4* 11.9* 11.5*  ?HCT 28.1* 35.3* 34.8*  ?MCV 91.5 91.5 91.3  ?MCH 30.6 30.8 30.2  ?MCHC 33.5 33.7 33.0  ?RDW 15.4 15.4 15.3  ?PLT 325 444* 442*  ? ?Thyroid  ?Recent Labs  ?Lab 05/15/21 ?2132  ?TSH 2.505  ?  ?BNP ?Recent Labs  ?Lab 05/11/21 ?3220 05/13/21 ?0202  ?BNP 2,412.6* 2,640.5*  ?  ?DDimer No results for input(s): DDIMER in the last 168 hours.  ? ?Radiology  ?  ?No results found. ? ?Cardiac Studies  ? ?Cardiac catheterization 05/11/2021: ?  Prox LAD to Mid LAD lesion is 100% stenosed. ?  Dist LAD-1 lesion is 70% stenosed. ?  Dist LAD-2 lesion is 70% stenosed. ?  Mid LAD lesion is 60% stenosed. ?  Ost Cx to Prox Cx lesion is 99% stenosed. ?  Prox Cx to Mid Cx lesion is 70% stenosed. ?  Ramus-1 lesion is 60% stenosed. ?  Ramus-2 lesion is 20% stenosed. ?  Ramus-3 lesion is 70% stenosed. ?  RPAV lesion is 60% stenosed. ?  Non-stenotic Mid LAD to Dist LAD lesion was previously treated. ?  LIMA graft was visualized by angiography and is normal in  caliber. ?  RIMA graft was visualized by non-selective angiography and is normal in caliber. ?  The graft exhibits no disease. ?  LV end diastolic pressure is moderately elevated. ?  ?Severe 2 vessel obstructive CAD ?Patent sequential LIMA graft  to the ramus intermediate and distal LAD ?Patent RIMA to the distal LCx ?Moderately elevated LVEDP ?  ?Compared to September  2022 there is no significant angiographic change. Recommend continued medical therapy.  ?  ?Echocardiogram 05/11/2021: ? 1. Diffuse hypokinesis worse in septum and apex . Left ventricular  ?ejection fraction, by estimation, is 30 to 35%. The left ventricle has  ?moderately decreased function. The left ventricle demonstrates global  ?hypokinesis. The left ventricular internal  ?cavity size was severely dilated. Left ventricular diastolic parameters  ?are indeterminate.  ? 2. Right ventricular systolic function is normal. The right ventricular  ?size is normal. There is normal pulmonary artery systolic pressure.  ? 3. The mitral valve is abnormal. Mild mitral valve regurgitation. No  ?evidence of mitral stenosis.  ? 4. The aortic valve is tricuspid. Aortic valve regurgitation is not  ?visualized. No aortic stenosis is present.  ? 5. The inferior vena cava is normal in size with greater than 50%  ?respiratory variability, suggesting right atrial pressure of 3 mmHg.  ? ?Patient Profile  ?   ?51 y.o. female with a hx of CAD w/ extensive PCI history and CABG 2020, HFrEF 2/2 ICM, T1DM, chronic anemia, hypothyroidism, was admitted 04/18 with N&V, abd pain. Cards saw for elevated troponin ? ?Assessment & Plan  ?  ?Hypotension ?Elevated troponin, suspect demand ischemia/Type II MI in the setting of acute GI illness and pneumonia, diabetic ketoacidosis ?Multivessel CAD s/p CABG ?-peak high-sensitivity troponin high 1580.  No chest pain. ?-cath with patent LIMA/RIMA, no significant change from prior ?-medical therapy  ?-continue aspirin, clopidogrel ?-tolerating low dose metoprolol succinate ?-trialed on amlodipine for potential vasospasm, but blood pressure did not tolerate ?-lactate persistently elevated, 2.6 overnight, improved to 1.9 this AM ? ?Ischemic cardiomyopathy ?Chronic systolic and diastolic heart failure ?-EF had improved to 40-45%, now 30-35% ?-tolerating low dose metoprolol succinate ?-appears euvolemic; daily  weights and I/O appear inaccurate ?-no room for ACEi/ARB/ARNI/MRA given chronic low blood pressure. Also had history of hyperkalemia. BP 90/63 today. ?-NYHA class III ?  ?Mixed hyperlipidemia with multi sta

## 2021-05-16 NOTE — Progress Notes (Signed)
1500: Tamara RN asked by this RN to check on patient and status of nausea post reglan administration. Pt found to be endorsing significant left quadrant pain. MD Aubrionna Istre secure chatted at 1504, per Natale Lay RN.  [phone] MD Obgata replied to Jonelle Sidle RN to do the CT.  ? ?1534: MD Mirel Hundal secure chat sent by this RN, Charge RN Natale Lay added to chat: E Mena 1554:"Pt still endorsing pain, doesn't think she's going to be able to sit still for CT. We asked if she think she could tolerate the scan without pain meds. She stated "no". Transport cancelled. Please confirm that the number in Amion is correct. I have paged your listed number twice, pharmacy paged once, and CT once. [phone]  is on file with Amion."  ? ?Secure Chat conversation - S. Sirena Riddle 1556: "You asked her and she said no!! Where is she hurting please?" ? ?Secure Chat conversation - T Williams 1605: "left upper and left lower quadrant. this is Actuary, I am helping erica RN ? ?Secure Chat conversation - S Vineta Carone 1606: "She needs a CT Scan to see if we can find what the problem may be. Check lactic acid. IV Reglan and PPI. Thanks for helping out Danae Chen" ? ?Secure Chat conversation - E Mena 1611: "Pt has had IV reglan, and ativan. over an hour ago, when pages were sent. Please CALL ME> put HR is high, BP is high. she is PALE. [phone]" ? ?1612Marthenia Rolling MD calls [facility] RN from [place]. RN informs MD that pt is pale, double over in pain, extremely hypertensive and tachycardic in the 140-150s. She is requiring O2 via [place] at 2L to maintain a 95% sat with good pleth. RN states that pt is in too much pain to go to CT and requests pain medications. MD is insistent that patient is suffering from diabetic gastroparesis and that pain meds will not help. MD states "What do you want to go, give her morphine?" And (paraphrase) zonk her out? Rn states that she didn't say morphine, but that patient doesn't even have tylenol on file to give. Rn again request pain  meds. MD states that pt needs IV protonix and the CT scan to figure out what is wrong. RN endorses that she agrees with his assessment of need for CT, but that pt is in excruciating pain and will not be able to tolerate the scan.  Provider then goes on to condescendingly explain that in order to treat pain you have to know the pathophysiology and treat the cause. RN asserts that patient needs pain meds to be able to stabilize and tolerate scan. MD states "FINE, Give '2mg'$  morphine IV push once." Rn "okay". RN prepares to read back the verbal order for closed loop communication. Phone conversation is ended by provider. RN Proofreader of inability to perform verbal read back to provider. Charge RN checks this RNs entered verbal order and states to call Pharmacy to verify that med and dosage are approriate.  ? ?9675: Lisa with pharmacy contacted by Designer, multimedia via phone. Situation explained. RN Mena verbalizes order given to her by provider and if order is "appropriate" Pharmacist states it is and she will verify it immediately. Med administered. See MAR. ? ?9163 CT informed of situation that pt vitals and pain need to stabilize prior to sending transport again for scan. RN states she is not optimistic that will happen prior to 1900.  ? ?1654 - [facility] called. RN explained  patient situation, that provider had not yet responded. RN asked that [facility] RN come lay eyes on the patient, that patient was currently stable appearing,  but that in the even that things escalated in the near future they would be aware of situation. [facility] endorsed he was finishing up with a situation, but would be in route shortly.  ? ?1733 - Critical result called from lab.  Lactic 2.5 had resulted. Kyra Searles MD paged.  ? ?1745 (approximately) - [facility] RN present on floor. Notified of lactic values face- to face. Reviewed patient summary with this RN looking for new orders regarding critical lactic.   No new orders at this time.  ? ?36 -  RN advised by Agricultural consultant to escalate situation to Director on Call. ? ?1759 Cleotis Nipper , Director on-call notified of lack of provider response. States she will try to reach out to provider. ? ?25 Reyna, Director on call: returns call to RN - unable to contact Kyra Searles MD. Advised RN that if RN felt patient status was of concern, to call [facility] and ask them to re-evaluate patient's appropriateness for progressive Care floor. RN expresses need to alert Therapist, sports. Cleotis Nipper states she will touch base with unit director tomorrow (Monday).   ? ?1823 - [facility] RN Saralyn Pilar called by RN. Updated on situation, asked to circle back and that despite pain level now zero, pt's vitals remain unstable.  ? ?1952 - [facility] called. Update on ETA requested. RN informed that Mindy with [facility] was aware of the situation and would be circling back. ? ?2100 John, AC, rounding to unit. Aware of situation ? ? ? ?This is Dr. Marthenia Rolling dictating on 05/16/2021. The time is 2:05 PM: This is in response to above documentation done by Evelene Croon on 05/15/2021, documented time 8:57 PM. ? ?This is to clearly state the following: ?1.  I did not receive any page from any of the above documented staff for personal. ?2.  Paged messages on amion cannot be deleted. ?3.  I have taken the time to show my phone to the director of the unit and she saw clearly that there was no paged message that came to my phone. ?4.  I also should several secure messages between [facility] and I yesterday.  At some point [facility] included one of her colleagues in the secure messages, hence, Evelene Croon had assess to me yesterday. ?5.  The purpose of above documentation remains unclear to me, hence, this will serve as a formal refuttal of above mentation by Erica Mean. ?6.  Lastly, the director of the unit and I saw the patient together and the patient remained very grateful for my professionalism, empathy and discharge of duties. ? ? ? ? ? ? ? ?

## 2021-05-16 NOTE — Progress Notes (Signed)
Inpatient Diabetes Program Recommendations ? ?AACE/ADA: New Consensus Statement on Inpatient Glycemic Control (2015) ? ?Target Ranges:  Prepandial:   less than 140 mg/dL ?     Peak postprandial:   less than 180 mg/dL (1-2 hours) ?     Critically ill patients:  140 - 180 mg/dL  ? ? Latest Reference Range & Units 05/15/21 11:47 05/15/21 16:15 05/15/21 18:08 05/15/21 19:44 05/15/21 21:24 05/16/21 02:44 05/16/21 06:34  ?Glucose-Capillary 70 - 99 mg/dL 159 (H) ? ?Insulin Pump 119 (H) 266 (H) ? ?Insulin Pump 344 (H) ? ?Insulin Pump 265 (H) ? ?3 units Novolog ? ?12 units Semglee 281 (H) 302 (H) ? ?11 units Novolog ?  ? ? ?Diabetes history: DM1 (requires basal, meal coverage, + correction) ? ?Outpatient Insulin: Medtronic Insulin pump ?Basal rate: ?0.85 units/hr, 6 AM-10 AM ?0.975 units/hr, 10 AM-10 PM ?0.75 units/hr 10PM-12MN ?0.625 overnight ?Total Basal per 24 hour period= 20.35 units based on above rates ?Bolus of 1 unit/16 grams carbohydrate.  ?Correction bolus 1 unit for each 100 by which your glucose exceeds 100 ? ? ?Current Orders: Semglee 12 units QHS ?     Novolog Moderate Correction Scale/ SSI (0-15 units) TID AC + HS ? ? ? ?MD- Note patient asked to remove home insulin pump last PM around 9pm.  Semglee and Novolog SSI started in place of pt's home insulin pump. ? ?CBG 302 this AM ? ?Please consider: ? ?1. Increase Semglee to 15 units QHS (gets about 20 units total for total basal rate per 24 hour period) ? ?2. Start Novolog Meal Coverage: Novolog 3 units TID with meals ?HOLD if pt eats <50% meals ? ? ? ?--Will follow patient during hospitalization-- ? ?Wyn Quaker RN, MSN, CDE ?Diabetes Coordinator ?Inpatient Glycemic Control Team ?Team Pager: [phone] (8a-5p) ? ? ?

## 2021-05-16 NOTE — Progress Notes (Signed)
[facility] night cross cover note: ? ?Dr. Duwayne Heck of the critical care service contacted me in order to close the loop on a consult request placed by daytime hospitalist for abdominal pain/tachycardia.  ? ?Briefly, this is a 51 year old female with somewhat complicated hospital course, with history of coronary disease status post CABG, COPD, who was initially admitted for NSTEMI given acquired pneumonia, echo which showed worsening LVEF, with ensuing left heart cath which showed no significant change in known CAD relative to prior before developing hypotension  for which the patient was transferred to the critical care service for transient pressors.  She also developed DKA, subsequently weaned off of insulin drip.  Ultimately, she was transferred back to the hospitalist service over the last 1 to 2 days.  ? ?Earlier today, CCM was consulted in the setting of patient reporting worsening abdominal discomfort, with heart rates into the 120s 130s reportedly sinus in nature.  Dr. Duwayne Heck  Of the critical care service evaluated the patient at bedside, without evidence of reported peritoneal signs on exam.  Patient's abdominal discomfort significantly improved following 2 mg of IV morphine, although she was somewhat drowsy following this dose.  With improving pain control, heart rate has been improving, without associated hypotension. ? ?Since transitioning off of insulin drip to the patient's home insulin pump on 05/14/2021, patient has had increasing CBG results, with most recent Dron notable for the following: 119 followed by 266 followed by most recent value 344. ? ?Following her evaluation, Dr. Duwayne Heck did not feel the need for transferring patient back to the ICU at this time as the patient's abdominal pain is significantly improved and tachycardia improving, without evidence of hypotension.  She is ordered repeat EKG, which, in comparison to most recent prior EKG from 05/13/2021 shows sinus tachycardia with heart rate  117, T wave flattening in leads I and aVL, interval improvement in previously noted ST depression in V5/V6, with no evidence of interval ST depression/elevation, will also noting Q waves in leads V1 through V6, of which Q wave in V1 was previously noted on EKG from 05/13/2021.  Additionally, updated lactic acid level ordered, found to be 2.6 relative to most recent prior value of 2.5, with associated recommendation from Dr. Duwayne Heck for 500 cc IV fluid bolus, which I have ordered.  Of also ordered repeat lactate with morning labs. ? ?Additionally, as relates to the patient's increasing blood sugar in the context of recent history of development of DKA, Dr. Duwayne Heck recommended transition from patient's insulin pump to Accu-Cheks with basal and short acting coverage.  ? ?Based upon these recommendations, I have discontinued patient's insulin pump. in the setting of the patient's most recent recorded weight of approximately 64 kg, started Lantus 12 units subcu daily, first dose now.  CBG checks qac/hs and 2 AM ordered with moderate dose sliding scale insulin.  Following initiation of basal insulin and dose of sliding scale NovoLog, ensuing blood sugar trending down, most recently 265. ? ?In terms of analgesic intervention moving forward, the patient reports that she has had previous good response to IV Dilaudid, and noted that the Dilaudid has not made her drowsy like the aforementioned dose of morphine did.  Consequently, I have placed order for Dilaudid 0.5 mg IV every 3 hours as needed. ? ? ? ? ?Babs Bertin, DO ?Hospitalist ? ?

## 2021-05-17 ENCOUNTER — Other Ambulatory Visit: Payer: Self-pay

## 2021-05-17 LAB — BASIC METABOLIC PANEL
Anion gap: 4 — ABNORMAL LOW (ref 5–15)
BUN: 8 mg/dL (ref 6–20)
CO2: 26 mmol/L (ref 22–32)
Calcium: 7.9 mg/dL — ABNORMAL LOW (ref 8.9–10.3)
Chloride: 108 mmol/L (ref 98–111)
Creatinine, Ser: 0.84 mg/dL (ref 0.44–1.00)
GFR, Estimated: >60 mL/min (ref >60)
Glucose, Bld: 131 mg/dL — ABNORMAL HIGH (ref 70–99)
Potassium: 3.2 mmol/L — ABNORMAL LOW (ref 3.5–5.1)
Sodium: 138 mmol/L (ref 135–145)

## 2021-05-17 LAB — CBC
HCT: 31.8 % — ABNORMAL LOW (ref 36.0–46.0)
Hemoglobin: 10.5 g/dL — ABNORMAL LOW (ref 12.0–15.0)
MCH: 30 pg (ref 26.0–34.0)
MCHC: 33 g/dL (ref 30.0–36.0)
MCV: 90.9 fL (ref 80.0–100.0)
Platelets: 411 10*3/uL — ABNORMAL HIGH (ref 150–400)
RBC: 3.5 MIL/uL — ABNORMAL LOW (ref 3.87–5.11)
RDW: 15.7 % — ABNORMAL HIGH (ref 11.5–15.5)
WBC: 6.8 10*3/uL (ref 4.0–10.5)
nRBC: 0 % (ref 0.0–0.2)

## 2021-05-17 LAB — MAGNESIUM: Magnesium: 2 mg/dL (ref 1.7–2.4)

## 2021-05-17 LAB — GLUCOSE, CAPILLARY: Glucose-Capillary: 173 mg/dL — ABNORMAL HIGH (ref 70–99)

## 2021-05-17 LAB — LEGIONELLA PNEUMOPHILA SEROGP 1 UR AG: L. pneumophila Serogp 1 Ur Ag: NEGATIVE

## 2021-05-17 MED ORDER — DOXYCYCLINE HYCLATE 100 MG PO TABS
100.0000 mg | ORAL_TABLET | Freq: Two times a day (BID) | ORAL | 0 refills | Status: AC
  Filled 2021-05-17: qty 6, 3d supply, fill #0

## 2021-05-17 MED ORDER — POTASSIUM CHLORIDE CRYS ER 20 MEQ PO TBCR
40.0000 meq | EXTENDED_RELEASE_TABLET | Freq: Once | ORAL | Status: AC
  Administered 2021-05-17: 40 meq via ORAL
  Filled 2021-05-17: qty 2

## 2021-05-17 NOTE — Progress Notes (Signed)
? ?Progress Note ? ?Patient Name: Jennifer Hogan ?Date of Encounter: 05/17/2021 ? ?[facility] Cardiologist: Fransico Him, MD  ?Advanced heart failure: Dr. Haroldine Laws ? ?Subjective  ? ?No acute events overnight. Feels much better this AM, tolerating breakfast.  ? ?Inpatient Medications  ?  ?Scheduled Meds: ? aspirin EC  81 mg Oral Daily  ? Chlorhexidine Gluconate Cloth  6 each Topical Daily  ? clopidogrel  75 mg Oral QHS  ? diphenhydrAMINE  25 mg Oral QHS  ? doxycycline  100 mg Oral Q12H  ? enoxaparin (LOVENOX) injection  40 mg Subcutaneous Q24H  ? insulin aspart  0-15 Units Subcutaneous TID WC  ? insulin aspart  0-5 Units Subcutaneous QHS  ? insulin glargine-yfgn  12 Units Subcutaneous QHS  ? levothyroxine  100 mcg Oral QAC breakfast  ? metoCLOPramide (REGLAN) injection  5 mg Intravenous Q6H  ? metoprolol succinate  12.5 mg Oral Daily  ? montelukast  10 mg Oral QHS  ?  morphine injection  2 mg Intravenous Once  ? pantoprazole (PROTONIX) IV  40 mg Intravenous Q12H  ? sodium chloride flush  3 mL Intravenous Q12H  ? sodium chloride flush  3 mL Intravenous Q12H  ? ?Continuous Infusions: ? sodium chloride Stopped (05/14/21 1738)  ? sodium chloride 250 mL (05/13/21 1205)  ? dextrose 5% lactated ringers Stopped (05/15/21 0700)  ? ?PRN Meds: ?sodium chloride, acetaminophen, albuterol, alum & mag hydroxide-simeth, budesonide, dextrose, dextrose, FLUoxetine, fluticasone, HYDROmorphone (DILAUDID) injection, hydrOXYzine, LORazepam, naLOXone (NARCAN)  injection, sodium chloride flush  ? ?Vital Signs  ?  ?Vitals:  ? 05/16/21 1156 05/16/21 1709 05/17/21 0024 05/17/21 0321  ?BP: 90/62 103/70 95/61 104/73  ?Pulse: 89 82 86 96  ?Resp: '17 16 16 20  '$ ?Temp: 97.7 ?F (36.5 ?C)  98 ?F (36.7 ?C) 98.2 ?F (36.8 ?C)  ?TempSrc: Oral  Oral Oral  ?SpO2: 96% 95% 100% 96%  ?Weight:    66 kg  ? ? ?Intake/Output Summary (Last 24 hours) at 05/17/2021 0839 ?Last data filed at 05/16/2021 1600 ?Gross per 24 hour  ?Intake 500 ml  ?Output --  ?Net 500  ml  ? ? ?  05/17/2021  ?  3:21 AM 05/16/2021  ?  2:45 AM 05/15/2021  ?  5:00 AM  ?Last 3 Weights  ?Weight (lbs) 145 lb 8.1 oz 149 lb 11.1 oz 140 lb 10.5 oz  ?Weight (kg) 66 kg 67.9 kg 63.8 kg  ?   ? ?Telemetry  ?  ?Sinus tach - Personally Reviewed ? ?ECG  ?  ?05/15/21 sinus tach at 117 bpm, old anteroseptal infarct - Personally Reviewed ? ?Physical Exam  ? ?GEN: Well nourished, well developed in no acute distress ?NECK: No JVD ?CARDIAC: regular rhythm, normal S1 and S2, no rubs or gallops. No murmur. ?VASCULAR: Radial pulses 2+ bilaterally.  ?RESPIRATORY:  Clear to auscultation without rales, wheezing or rhonchi  ?ABDOMEN: Soft, non-tender, non-distended ?MUSCULOSKELETAL:  Moves all 4 limbs independently ?SKIN: Warm and dry, no edema ?NEUROLOGIC:  No focal neuro deficits noted. ?PSYCHIATRIC:  Normal affect   ? ?Labs  ?  ?High Sensitivity Troponin:   ?Recent Labs  ?Lab 05/10/21 ?1914 05/10/21 ?2219 05/11/21 ?0747 05/11/21 ?1004  ?TROPONINIHS 347* 979* 1,580* 1,393*  ?   ?Chemistry ?Recent Labs  ?Lab 05/11/21 ?4132 05/11/21 ?1355 05/13/21 ?0202 05/13/21 ?1421 05/15/21 ?2132 05/16/21 ?0015 05/17/21 ?0339  ?NA 136   < > 136   < > 136 136 138  ?K 4.0   < > 3.8   < >  4.4 4.8 3.2*  ?CL 106   < > 109   < > 105 103 108  ?CO2 22   < > 13*   < > 19* 22 26  ?GLUCOSE 128*   < > 231*   < > 273* 261* 131*  ?BUN 23*   < > 20   < > '11 9 8  '$ ?CREATININE 1.09*   < > 1.08*   < > 0.88 0.87 0.84  ?CALCIUM 9.1   < > 8.3*   < > 8.4* 8.6* 7.9*  ?MG  --   --  1.9  --   --  1.6* 2.0  ?PROT 7.3  --   --   --  6.6  --   --   ?ALBUMIN 3.9  --  3.0*  --  3.2* 3.0*  --   ?AST 44*  --   --   --  24  --   --   ?ALT 19  --   --   --  15  --   --   ?ALKPHOS 71  --   --   --  70  --   --   ?BILITOT 0.7  --   --   --  0.5  --   --   ?GFRNONAA >60   < > >60   < > >60 >60 >60  ?ANIONGAP 8   < > 14   < > 12 11 4*  ? < > = values in this interval not displayed.  ?  ?Lipids  ?Recent Labs  ?Lab 05/11/21 ?8110  ?CHOL 258*  ?TRIG 64  ?HDL 78  ?LDLCALC 167*  ?CHOLHDL  3.3  ?  ?Hematology ?Recent Labs  ?Lab 05/15/21 ?2132 05/16/21 ?0015 05/17/21 ?0339  ?WBC 9.9 9.5 6.8  ?RBC 3.86* 3.81* 3.50*  ?HGB 11.9* 11.5* 10.5*  ?HCT 35.3* 34.8* 31.8*  ?MCV 91.5 91.3 90.9  ?MCH 30.8 30.2 30.0  ?MCHC 33.7 33.0 33.0  ?RDW 15.4 15.3 15.7*  ?PLT 444* 442* 411*  ? ?Thyroid  ?Recent Labs  ?Lab 05/15/21 ?2132  ?TSH 2.505  ?  ?BNP ?Recent Labs  ?Lab 05/11/21 ?3159 05/13/21 ?0202  ?BNP 2,412.6* 2,640.5*  ?  ?DDimer No results for input(s): DDIMER in the last 168 hours.  ? ?Radiology  ?  ?No results found. ? ?Cardiac Studies  ? ?Cardiac catheterization 05/11/2021: ?  Prox LAD to Mid LAD lesion is 100% stenosed. ?  Dist LAD-1 lesion is 70% stenosed. ?  Dist LAD-2 lesion is 70% stenosed. ?  Mid LAD lesion is 60% stenosed. ?  Ost Cx to Prox Cx lesion is 99% stenosed. ?  Prox Cx to Mid Cx lesion is 70% stenosed. ?  Ramus-1 lesion is 60% stenosed. ?  Ramus-2 lesion is 20% stenosed. ?  Ramus-3 lesion is 70% stenosed. ?  RPAV lesion is 60% stenosed. ?  Non-stenotic Mid LAD to Dist LAD lesion was previously treated. ?  LIMA graft was visualized by angiography and is normal in caliber. ?  RIMA graft was visualized by non-selective angiography and is normal in caliber. ?  The graft exhibits no disease. ?  LV end diastolic pressure is moderately elevated. ?  ?Severe 2 vessel obstructive CAD ?Patent sequential LIMA graft  to the ramus intermediate and distal LAD ?Patent RIMA to the distal LCx ?Moderately elevated LVEDP ?  ?Compared to September 2022 there is no significant angiographic change. Recommend continued medical therapy.  ?  ?Echocardiogram 05/11/2021: ? 1. Diffuse hypokinesis  worse in septum and apex . Left ventricular  ?ejection fraction, by estimation, is 30 to 35%. The left ventricle has  ?moderately decreased function. The left ventricle demonstrates global  ?hypokinesis. The left ventricular internal  ?cavity size was severely dilated. Left ventricular diastolic parameters  ?are indeterminate.  ? 2.  Right ventricular systolic function is normal. The right ventricular  ?size is normal. There is normal pulmonary artery systolic pressure.  ? 3. The mitral valve is abnormal. Mild mitral valve regurgitation. No  ?evidence of mitral stenosis.  ? 4. The aortic valve is tricuspid. Aortic valve regurgitation is not  ?visualized. No aortic stenosis is present.  ? 5. The inferior vena cava is normal in size with greater than 50%  ?respiratory variability, suggesting right atrial pressure of 3 mmHg.  ? ?Patient Profile  ?   ?51 y.o. female with a hx of CAD w/ extensive PCI history and CABG 2020, HFrEF 2/2 ICM, T1DM, chronic anemia, hypothyroidism, was admitted 04/18 with N&V, abd pain. Cards saw for elevated troponin ? ?Assessment & Plan  ?  ?Hypotension ?Elevated troponin, suspect demand ischemia/Type II MI in the setting of acute GI illness and pneumonia, diabetic ketoacidosis ?Multivessel CAD s/p CABG ?-peak high-sensitivity troponin high 1580.  No chest pain. ?-cath with patent LIMA/RIMA, no significant change from prior ?-medical therapy  ?-continue aspirin, clopidogrel ?-tolerating low dose metoprolol succinate ?-trialed on amlodipine for potential vasospasm, but blood pressure did not tolerate ? ?Ischemic cardiomyopathy ?Chronic systolic and diastolic heart failure ?-EF had improved to 40-45%, now 30-35% ?-tolerating low dose metoprolol succinate ?-appears euvolemic; daily weights and I/O appear inaccurate ?-no room for ACEi/ARB/ARNI/MRA given chronic low blood pressure. Also had history of hyperkalemia. BP 104/73 today. ?-NYHA class III ?-potassium 3.2 today. This may be due to improved glucose control. No BP room for meds, but if K remains low/normal, could re-evaluate medication options as an outpatient. K and blood pressure would need to be monitored closely. Historically has not had BP room for meds beyond very low dose metoprolol succinate. ?  ?Mixed hyperlipidemia with multi statin intolerance ?-Previously on  Praluent as an outpatient, med list notes atorvastatin. She was taking 10 mg atorvastatin intermittently at home prior to admission. She did tolerate Praluent and would be amenable to restart, but wi

## 2021-05-17 NOTE — Discharge Summary (Signed)
?Physician Discharge Summary ?  ?Patient: Jennifer Hogan MRN: [id number] DOB: 1970/09/07  ?Admit date:     05/10/2021  ?Discharge date: 05/17/2021  ?Discharge Physician: Dwyane Dee  ? ?PCP: [name], NP  ? ?Recommendations at discharge:  ? ? Follow up with cardiology ? ?Discharge Diagnoses: ?Principal Problem: ?  NSTEMI (non-ST elevated myocardial infarction) ([place]) ?Active Problems: ?  Hypothyroidism ?  Type 1 diabetes mellitus ([place]) ?  SIRS (systemic inflammatory response syndrome) ([facility]) ?  CAD S/P percutaneous coronary angioplasty ?  Marijuana abuse ?  Hyperlipidemia LDL goal <70 ?  Heart failure with reduced ejection fraction ([place]) ?  Acute respiratory failure with hypoxia ([place]) ?  Community acquired pneumonia ? ?Resolved Problems: ?  * No resolved hospital problems. * ? ?Hospital Course: ? ?NSTEMI CAD ?-Patient has had cardiac catheterization without significant finding. ?- continue Toprol, lipitor, asa, plavix, PRN lasix ?- outpatient followup with cardiology ?  ?SIRS/sepsis ?- u/k source and part of lactic acid elevation may be due to hypoperfusion from blood pressure drop with amlodipine ?- Patient was started empirically on doxycycline and discharged home to complete empiric course ?-Sepsis and SIRS physiology resolved prior to discharge ? ?Acute respiratory failure with hypoxia ?Heart failure with reduced EF  ?-Initially required oxygen supplementation and was weaned to room air prior to discharge ?- Has only needed PRN Lasix ?- Outpatient follow-up with cardiology ?  ?Severe metabolic acidosis: ?- possibly from lactic acid; no clear etiology; possibly from u/k source vs hypoperfusion 2/2 BP drop ? ?Mild respiratory alkalosis: ?- discharged on RA ?  ?Hyperlipidemia ?-Continue statins.   ? ?Hypotension ?- Blood pressure could not tolerate additional agents ?- Possible initiation with further GDMT outpatient at follow-up ?      ?Diabetes mellitus type 1 ?- continue home insulin at discharge  ?   ?Hypothyroidism ?Home regimen includes levothyroxine 100 mcg daily.  Last TSH 0.737 on 03/25/2021. ?-Continue levothyroxine ?  ?Anxiety ?Medication regimen includes Prozac 40 mg daily as needed. ?-discussed that this is not a as needed medication and needs to be taken on a consistent basis to have benefit ?  ?Marijuana abuse ?  ?Persistent nausea and vomiting: ?-Rule out gastroparesis. ?-Improved significantly with IV Reglan. ?05/16/2021: Resolved.  Likely secondary to gastroparesis.  Optimize diabetes mellitus management.  Follow-up with GI team on discharge (patient already has an appointment with the GI team).  ? ? ?  ? ? ?Consultants: Cardiology, 05/11/21 ?Procedures performed: [facility]  ?Disposition: Home ?Diet recommendation:  ?Discharge Diet Orders (From admission, onward)  ? ?  Start     Ordered  ? 05/17/21 0000  Diet - low sodium heart healthy       ? 05/17/21 0857  ? 05/17/21 0000  Diet Carb Modified       ? 05/17/21 0857  ? ?  ?  ? ?  ? ?Cardiac and Carb modified diet ?DISCHARGE MEDICATION: ?Allergies as of 05/17/2021   ? ?   Reactions  ? Atorvastatin Other (See Comments)  ? Myalgias on '80mg'$  dose, tolerates '20mg'$  ok  ? Crestor [rosuvastatin Calcium] Other (See Comments)  ? Severe myalgias and joint pain. Has tolerated atorvastatin though  ? Monosodium Glutamate Nausea And Vomiting, Other (See Comments)  ? migraine  ? Zetia [ezetimibe] Other (See Comments)  ? myalgias  ? Ciprofloxin Hcl [ciprofloxacin] Nausea And Vomiting, Other (See Comments)  ? Severe migraine  ? Food Color Red [red Dye] Diarrhea  ? ?  ? ?  ?Medication List  ?  ? ?  TAKE these medications   ? ?acetaminophen 325 MG tablet ?Commonly known as: TYLENOL ?Take 325-650 mg by mouth daily as needed for mild pain, fever or headache. ?  ?albuterol 108 (90 Base) MCG/ACT inhaler ?Commonly known as: VENTOLIN HFA ?INHALE 2 PUFFS INTO THE LUNGS EVERY 6 (SIX) HOURS AS NEEDED FOR WHEEZING OR SHORTNESS OF BREATH. ?What changed: how much to take ?  ?[place] ?Chew 2 capsules by mouth daily. ?  ?aspirin 81 MG EC tablet ?Take 1 tablet (81 mg total) by mouth daily. ?What changed: when to take this ?  ?atorvastatin 20 MG tablet ?Commonly known as: LIPITOR ?Take 1 tablet (20 mg total) by mouth daily. ?  ?b complex vitamins tablet ?Take 1 tablet by mouth daily. ?  ?brimonidine 0.15 % ophthalmic solution ?Commonly known as: ALPHAGAN ?Place 1 drop into the right eye 2 (two) times daily. ?  ?budesonide 0.25 MG/2ML nebulizer solution ?Commonly known as: PULMICORT ?Take 0.25 mg by nebulization as needed (shortness of breath/wheezing). ?  ?clopidogrel 75 MG tablet ?Commonly known as: PLAVIX ?TAKE 1 TABLET BY MOUTH DAILY WITH BREAKFAST. ?What changed:  ?how much to take ?how to take this ?when to take this ?  ?diclofenac Sodium 1 % Gel ?Commonly known as: VOLTAREN ?Apply 2-4 g topically as needed (knee pain). ?  ?diphenhydrAMINE 25 MG tablet ?Commonly known as: BENADRYL ?Take 25 mg by mouth at bedtime. ?  ?doxycycline 100 MG tablet ?Commonly known as: VIBRA-TABS ?Take 1 tablet (100 mg total) by mouth every 12 (twelve) hours for 3 days. ?  ?FLUoxetine 40 MG capsule ?Commonly known as: PROZAC ?TAKE 1 CAPSULE BY MOUTH DAILY. ?What changed:  ?how much to take ?when to take this ?reasons to take this ?  ?fluticasone 50 MCG/ACT nasal spray ?Commonly known as: FLONASE ?Place 1 spray into both nostrils at bedtime as needed for allergies. ?  ?furosemide 20 MG tablet ?Commonly known as: LASIX ?Take 1 tablet (20 mg total) by mouth daily as needed (for weight gain). ?  ?HumaLOG 100 UNIT/ML injection ?Generic drug: insulin lispro ?Inject 35 units into the skin daily via pump ?  ?hydrOXYzine 25 MG tablet ?Commonly known as: ATARAX ?Take 1 tablet (25 mg total) by mouth 3 (three) times daily as needed. ?What changed:  ?when to take this ?reasons to take this ?  ?insulin pump Soln ?Inject into the skin continuous. Humalog ?  ?levothyroxine 100 MCG tablet ?Commonly known as:  SYNTHROID ?TAKE 1 TABLET (100 MCG TOTAL) BY MOUTH DAILY BEFORE BREAKFAST. ?What changed:  ?how much to take ?how to take this ?when to take this ?  ?metoCLOPramide 5 MG tablet ?Commonly known as: Reglan ?Take 1 tablet (5 mg total) by mouth every 12 (twelve) hours as needed for nausea or vomiting. ?  ?metoprolol succinate 25 MG 24 hr tablet ?Commonly known as: TOPROL-XL ?Take 0.5 tablets (12.5 mg total) by mouth daily. ?What changed: when to take this ?  ?montelukast 10 MG tablet ?Commonly known as: SINGULAIR ?Take 1 tablet (10 mg total) by mouth at bedtime. ?  ?nitroGLYCERIN 0.4 MG SL tablet ?Commonly known as: NITROSTAT ?Place 1 tablet (0.4 mg total) under the tongue every 5 (five) minutes as needed for chest pain. ?  ?ofloxacin 0.3 % ophthalmic solution ?Commonly known as: OCUFLOX ?Place 1 drop into affected eye(s) 4 (four) times daily. ?  ?omeprazole 20 MG tablet ?Commonly known as: PRILOSEC OTC ?Take 20 mg by mouth daily as needed (acid reflux). ?  ?OVER THE  COUNTER MEDICATION ?Take 3 capsules by mouth daily before breakfast. [employer] super lysine immune support otc supplement ?  ?OVER THE COUNTER MEDICATION ?Take 25 mg by mouth at bedtime as needed (sleep/joint pain). CBD oil ?  ?prednisoLONE acetate 1 % ophthalmic suspension ?Commonly known as: PRED FORTE ?Place 1 drop into affected eyes. three times a day for 1 week then  twice a day  for one week then once daily for one week ?  ? ?  ? ? ?Discharge Exam: ?Filed Weights  ? 05/15/21 0500 05/16/21 0245 05/17/21 0321  ?Weight: 63.8 kg 67.9 kg 66 kg  ? ?Physical Exam ?Constitutional:   ?   Appearance: She is well-developed.  ?HENT:  ?   Head: Normocephalic and atraumatic.  ?   Mouth/Throat:  ?   Mouth: Mucous membranes are moist.  ?Eyes:  ?   Extraocular Movements: Extraocular movements intact.  ?Cardiovascular:  ?   Rate and Rhythm: Normal rate and regular rhythm.  ?Pulmonary:  ?   Effort: Pulmonary effort is normal.  ?   Breath sounds: Normal breath  sounds.  ?Abdominal:  ?   General: Bowel sounds are normal. There is no distension.  ?   Palpations: Abdomen is soft.  ?   Tenderness: There is no abdominal tenderness.  ?Musculoskeletal:     ?   General: Normal range of moti

## 2021-05-18 ENCOUNTER — Telehealth: Payer: Self-pay

## 2021-05-18 LAB — CULTURE, BLOOD (ROUTINE X 2)
Culture: NO GROWTH
Culture: NO GROWTH
Special Requests: ADEQUATE

## 2021-05-18 NOTE — Telephone Encounter (Signed)
Transition Care Management Unsuccessful Follow-up Telephone Call ? ?Date of discharge and from where:  05/17/2021, [facility] ? ?Attempts:  1st Attempt ? ?Reason for unsuccessful TCM follow-up call:  Left voice message on # [phone]. Call back requested.  ? ?Need to discuss scheduling a hospital follow up appointment with PCP ? ? ? ?

## 2021-05-19 ENCOUNTER — Other Ambulatory Visit: Payer: Self-pay

## 2021-05-19 ENCOUNTER — Telehealth: Payer: Self-pay

## 2021-05-19 NOTE — Telephone Encounter (Signed)
Transition Care Management Unsuccessful Follow-up Telephone Call ? ?Date of discharge and from where:  05/17/2021, [facility]  ? ?Attempts:  2nd Attempt ? ?Reason for unsuccessful TCM follow-up call:  Left voice message on # [phone]. Call back requested.  ?  ?Need to discuss scheduling a hospital follow up appointment with PCP   ? ? ? ?

## 2021-05-20 ENCOUNTER — Encounter: Payer: Self-pay | Admitting: Nurse Practitioner

## 2021-05-23 ENCOUNTER — Ambulatory Visit (HOSPITAL_COMMUNITY)
Admission: EM | Admit: 2021-05-23 | Discharge: 2021-05-23 | Disposition: A | Discharge status: Home / Self Care | Payer: No Payment, Other | Attending: Psychiatry | Admitting: Psychiatry

## 2021-05-23 ENCOUNTER — Telehealth: Payer: Self-pay

## 2021-05-23 ENCOUNTER — Other Ambulatory Visit: Payer: Self-pay | Admitting: Nurse Practitioner

## 2021-05-23 ENCOUNTER — Other Ambulatory Visit: Payer: Self-pay

## 2021-05-23 DIAGNOSIS — R45851 Suicidal ideations: Secondary | ICD-10-CM | POA: Insufficient documentation

## 2021-05-23 DIAGNOSIS — J449 Chronic obstructive pulmonary disease, unspecified: Secondary | ICD-10-CM | POA: Insufficient documentation

## 2021-05-23 DIAGNOSIS — E119 Type 2 diabetes mellitus without complications: Secondary | ICD-10-CM | POA: Insufficient documentation

## 2021-05-23 DIAGNOSIS — F322 Major depressive disorder, single episode, severe without psychotic features: Secondary | ICD-10-CM | POA: Insufficient documentation

## 2021-05-23 NOTE — ED Provider Notes (Signed)
Behavioral Health Urgent Care Medical Screening Exam ? ?Patient Name: Jennifer Hogan ?MRN: [id number] ?Date of Evaluation: 05/23/21 ?Chief Complaint:   ?Diagnosis:  ?Final diagnoses:  ?Current severe episode of major depressive disorder without psychotic features without prior episode ([place])  ? ? ?History of Present illness: Jennifer Hogan is a 51 y.o. female.  [facility] accompanied by [facility] due to worsening depression and suicidal ideation.  Was reported she made suicidal statements the health and wellness follow-up coordinator.  Jennifer Hogan reported struggling with multiple stressors mainly financial in nature.  States recent job loss, declining health and a denial by disability.  States she was recently hospitalized due to COPD flare and uncontrolled diabetes.  She reports feeling depressed for quite some time.  Denied previous suicide attempts.  Denied previous inpatient admissions.  Denied that she is followed by therapy or psychiatry currently.  States she is prescribed Prozac which she reports taking and tolerating well.  Jennifer Hogan reports using marijuana last week however has since discontinued use.  Denies any other illicit drug use. ? ?Jennifer Hogan provided verbal authorization to follow-up with her support system Jennifer Hogan.  This provider spoke to Jennifer Hogan at length regarding safety concerns.  She reports she and her husband will continue to monitor patient and adamantly denied any safety concerns with patient returning home.  States " loves her cats and  horses,we will be on our way to pick her up."  Denied history of previous suicide attempts. denies access to firearms and other weapons.  ? ?During evaluation Jennifer Hogan is sitting in no acute distress. She is alert/oriented x 4; calm/cooperative; and mood congruent with affect.  She is speaking in a clear tone at moderate volume, and normal pace; with good eye contact. Her thought process is coherent and  relevant; There is no indication that she is currently responding to internal/external stimuli or experiencing delusional thought content; and she has denied suicidal/self-harm/homicidal ideation, psychosis, and paranoia.   ?Patient has remained calm throughout assessment and has answered questions appropriately.   ? ? ?At this time Jennifer Hogan is educated and verbalizes understanding of mental health resources and other crisis services in the community.She is instructed to call 911 and present to the nearest emergency room should she experience any suicidal/homicidal ideation, auditory/visual/hallucinations, or detrimental worsening of her mental health condition.She was a also advised by Probation officer that she could call the toll-free phone on insurance card to assist with identifying in network counselors and agencies or number on back of Medicaid card to speak with care coordinator.  ?  ? ?Psychiatric Specialty Exam ? ?Presentation  ?General Appearance:Appropriate for Environment ? ?Eye Contact:Good ? ?Speech:Clear and Coherent ? ?Speech Volume:Normal ? ?Handedness:No data recorded ? ?Mood and Affect  ?Mood:Anxious; Depressed ? ?Affect:Congruent ? ? ?Thought Process  ?Thought Processes:Coherent ? ?Descriptions of Associations:Intact ? ?Orientation:Full (Time, Place and Person) ? ?Thought Content:Logical ?   Hallucinations:None ? ?Ideas of Reference:None ? ?Suicidal Thoughts:Yes, Passive ?Without Intent ? ?Homicidal Thoughts:No ? ? ?Sensorium  ?Memory:Immediate Good; Recent Good; Remote Good ? ?Judgment:Fair ? ?Insight:Fair ? ? ?Executive Functions  ?Concentration:Fair ? ?Attention Span:Good ? ?Recall:Good ? ?[facility] [place] ? ?Language:Good ? ? ?Psychomotor Activity  ?Psychomotor Activity:Normal ? ? ?Assets  ?Assets:Social Support; Desire for Improvement ? ? ?Sleep  ?Sleep:Fair ? ?Number of hours: No data recorded ? ?Nutritional Assessment (For OBS and [facility]BC admissions only) ?Has the patient had a weight  loss or gain of 10 pounds or  more in the last 3 months?: No ?Has the patient had a decrease in food intake/or appetite?: No ?Does the patient have dental problems?: No ?Does the patient have eating habits or behaviors that may be indicators of an eating disorder including binging or inducing vomiting?: No ?Has the patient recently lost weight without trying?: 0 ?Has the patient been eating poorly because of a decreased appetite?: 0 ?Malnutrition Screening Tool Score: 0 ? ? ? ?Physical Exam: ?Physical Exam ?Vitals and nursing note reviewed.  ?Cardiovascular:  ?   Rate and Rhythm: Normal rate and regular rhythm.  ?Neurological:  ?   Mental Status: She is oriented to person, place, and time.  ?Psychiatric:     ?   Mood and Affect: Mood normal.     ?   Behavior: Behavior normal.     ?   Thought Content: Thought content normal.  ? ?Review of Systems  ?Eyes: Negative.   ?Cardiovascular: Negative.   ?Musculoskeletal: Negative.   ?Psychiatric/Behavioral:  Positive for depression. Negative for hallucinations. Suicidal ideas: passive ideations, denied plan or intent.The patient is nervous/anxious.   ?All other systems reviewed and are negative. ?Blood pressure 109/71, pulse 75, temperature 98.4 ?F (36.9 ?C), temperature source Oral, resp. rate 18, SpO2 99 %. There is no height or weight on file to calculate BMI. ? ?Musculoskeletal: ?Strength & Muscle Tone: within normal limits ?Gait & Station: normal ?Patient leans: N/A ? ? ?[facility] Discharge Disposition for Follow up and Recommendations: ?Based on my evaluation the patient does not appear to have an emergency medical condition and can be discharged with resources and follow up care in outpatient services for Partial Hospitalization Program ? ? ?Derrill Center, NP ?05/23/2021, 5:01 PM ? ?

## 2021-05-23 NOTE — Discharge Summary (Signed)
Jennifer Hogan to be D/C'd  Home  per NP order. An After Visit Summary was printed and given to the patient. Patient escorted out and D/C home via private auto.  ?Rishabh Rinkenberger  Kathlen Brunswick  ?05/23/2021 4:52 PM ?  ?   ?

## 2021-05-23 NOTE — Discharge Instructions (Signed)
Take all medications as prescribed. Keep all follow-up appointments as scheduled.  Do not consume alcohol or use illegal drugs while on prescription medications. Report any adverse effects from your medications to your primary care provider promptly.  In the event of recurrent symptoms or worsening symptoms, call 911, a crisis hotline, or go to the nearest emergency department for evaluation.   

## 2021-05-23 NOTE — BH Assessment (Signed)
Jennifer Hogan here voluntarily with [employer] due to worsening depression. Pt received a follow up call from [place] today and when they asked her how she was doing she started crying and informed them she wanted to die but did not want to kill herself. 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 Pt states "I dont want to live anymore". Denies making plans to kill or hurt self, denies HI, AVH, last used [facility] 04/26/21. No outpt services.  ?

## 2021-05-23 NOTE — Telephone Encounter (Signed)
Transition Care Management Follow-up Telephone Call ?Date of discharge and from where: 05/17/2021, [facility]  ?How have you been since you were released from the hospital? The patient started crying as soon as she started speaking.  She explained that she is really depressed. She is not sure how she can continue to exist with her multiple medical problems. She said that she thought about killing herself yesterday but said that she realized that she has to take care of her pets. She continued to cry. She denied any suicidal thoughts at this time. She said she never had a plan but continued to say she is not sure how she can go on.  She said she had behavioral health therapy a few years ago and is not opposed to trying therapy again. She said she is not taking any behavioral health medications now. She said she has been denied disability three times and is not sure how sick she needs to be to be approved for disability. I explained to her that we can try to assist her with the disability appeal but right now we need to focus on her mental health.  She was in agreement. I told her that I would call mobile crisis and or the police if needed to make sure she okay.  I confirmed the best phone number to reach her. She continued to cry but accepted the help that I offered.  ? ?I called [facility] # [phone], spoke to Henrietta and provided her with the contact information for patient and explained patient's situation. She said she would reach out to patient and offer options for assistance including, coordinating transportation to [facility],  offering in person or telephonic assessment,offering the services of outpatient mental health agencies.   ? ?Call received from [facility].  She said she called the patient twice and she didn't answer.  She left messages for her requesting a call back and left her the number for the crisis line.  Claiborne Billings said if she doesn't call back, they will  take that as a refusal of services. ? ?I called 911 and requested welfare check and explained the reason for concern. ? ? Call received from [facility] office, spoke to officer, [place] who stated that the patient is definitely in crisis and they have officers coming to take her to [facility] for an evaluation. He said she was crying and spoke of wanting to kill herself but she did not have a plan.  ?

## 2021-05-24 ENCOUNTER — Other Ambulatory Visit (HOSPITAL_COMMUNITY): Payer: Self-pay

## 2021-05-24 MED ORDER — FLUOXETINE HCL 40 MG PO CAPS
ORAL_CAPSULE | Freq: Every day | ORAL | 0 refills | Status: DC
  Filled 2021-05-24: qty 90, 90d supply, fill #0
  Filled 2021-05-25: qty 30, 30d supply, fill #0
  Filled 2021-06-23: qty 30, 30d supply, fill #1
  Filled 2021-08-10: qty 30, 30d supply, fill #2

## 2021-05-25 ENCOUNTER — Other Ambulatory Visit (HOSPITAL_COMMUNITY): Payer: Self-pay

## 2021-05-25 ENCOUNTER — Other Ambulatory Visit: Payer: Self-pay

## 2021-05-26 NOTE — Progress Notes (Deleted)
Cardiology Office Note:    Date:  05/26/2021   ID:  Jennifer Hogan, DOB [birth year], MRN [id number]  PCP:  Jennifer Pounds, NP   [facility] Providers Cardiologist:  Jennifer Him, MD { Click to update primary MD,subspecialty MD or APP then REFRESH:1}    Referring MD: Jennifer Pounds, NP   Chief Complaint: ***  History of Present Illness:    Jennifer Hogan is a *** 51 y.o. female with a hx of CAD s/p CABG 2020 with previous stenting, ICM, hyperlipidemia, Type 1 diabetes,   Admission 4/with cardiology consult requested for elevated troponin felt to be due to GI illness and pneumonia, diabetic ketoacidosis. Peak hs troponin 1580, without chest pain, patent LIMA to LAD, no change on cardiac catheterization from prior, medical therapy recommended.  She was trialed on amlodipine for potential vasospasm but BP did not tolerate.  EF down to 30 to 35%, had previously improved to 40-45%, tolerating low-dose metoprolol succinate, no BP for ACEi/ARB/ARNI/MRA, also with history of hyperkalemia but with K+ of 3.2 at time of discharge. Historically has not had room for meds beyond low dose metoprolol.   Tolerated Pralulent, however was on low dose atorvastatin only at time of admission. Is agreeable to restart if PA can be provided.   Past Medical History:  Diagnosis Date   Anemia    Anxiety    Coronary artery disease    a. s/p PTCA DES x3 in the LAD (ostial DES placed, mid DES placed, distal DES placed) and PTCA/DES x1 to intermediate branch 02/2017. b. ACS 02/08/18 with LCx stent placement.   Depression    Diabetes mellitus    type 1, patient is not using her insulin pump.   DKA (diabetic ketoacidoses) 12/31/2017   Dyspnea    with exertion and dust, no oxygen   Elevated platelet count    GERD (gastroesophageal reflux disease)    High cholesterol    Hypothyroidism    Ischemic cardiomyopathy    a. EF low-normal with basal inferior akinesis, basal septal hypokinesis by echo 01/2018.    Myocardial infarction ([place]) 2019    Past Surgical History:  Procedure Laterality Date   APPENDECTOMY     CORONARY ARTERY BYPASS GRAFT N/A 11/04/2018   Procedure: CORONARY ARTERY BYPASS GRAFTING (CABG), ON PUMP, TIMES THREE, USING LEFT AND RIGHT INTERNAL MAMMARY ARTERIES;  Surgeon: Wonda Olds, MD;  Location: [place];  Service: Open Heart Surgery;  Laterality: N/A;   CORONARY STENT INTERVENTION N/A 03/20/2017   Procedure: DES x 3 LAD, DES OM; Surgeon: Burnell Blanks, MD;  Location: [place] [facility];  Service: Cardiovascular;  Laterality: N/A;   CORONARY/GRAFT ACUTE MI REVASCULARIZATION N/A 02/08/2018   Procedure: Coronary/Graft Acute MI Revascularization;  Surgeon: Belva Crome, MD;  Location: [place] [facility];  Service: Cardiovascular;  Laterality: N/A;   GAS INSERTION Right 04/28/2021   Procedure: INSERTION OF GAS ( C3F8);  Surgeon: Jalene Mullet, MD;  Location: [place];  Service: Ophthalmology;  Laterality: Right;  Right eye   GAS/FLUID EXCHANGE Right 04/28/2021   Procedure: GAS/FLUID EXCHANGE;  Surgeon: Jalene Mullet, MD;  Location: [place];  Service: Ophthalmology;  Laterality: Right;  Right eye   LEFT HEART CATH AND CORONARY ANGIOGRAPHY N/A 03/20/2017   Procedure: LEFT HEART CATH AND CORONARY ANGIOGRAPHY;  Surgeon: Burnell Blanks, MD;  Location: [place] [facility];  Service: Cardiovascular;  Laterality: N/A;   LEFT HEART CATH AND CORONARY ANGIOGRAPHY N/A 02/08/2018   Procedure: LEFT  HEART CATH AND CORONARY ANGIOGRAPHY;  Surgeon: Belva Crome, MD;  Location: [place] [facility];  Service: Cardiovascular;  Laterality: N/A;   LEFT HEART CATH AND CORONARY ANGIOGRAPHY N/A 10/29/2018   Procedure: LEFT HEART CATH AND CORONARY ANGIOGRAPHY;  Surgeon: Burnell Blanks, MD;  Location: [place] [facility];  Service: Cardiovascular;  Laterality: N/A;   LEFT HEART CATH AND CORS/GRAFTS ANGIOGRAPHY N/A 10/04/2020   Procedure: LEFT HEART CATH AND CORS/GRAFTS ANGIOGRAPHY;  Surgeon:  Sherren Mocha, MD;  Location: [place] [facility];  Service: Cardiovascular;  Laterality: N/A;   LEFT HEART CATH AND CORS/GRAFTS ANGIOGRAPHY N/A 05/11/2021   Procedure: LEFT HEART CATH AND CORS/GRAFTS ANGIOGRAPHY;  Surgeon: Martinique, Peter M, MD;  Location: [place] [facility];  Service: Cardiovascular;  Laterality: N/A;   PARS PLANA VITRECTOMY Right 04/28/2021   Procedure: PARS PLANA VITRECTOMY WITH 25 GAUGE REMOVAL OF TRATIONAL MEMBRANE RIGHT EYE; DRAINAGE OF SUBRETINAL FLUID RIGHT EYE;  Surgeon: Jalene Mullet, MD;  Location: [place];  Service: Ophthalmology;  Laterality: Right;  Right eye   PHOTOCOAGULATION WITH LASER Right 04/28/2021   Procedure: PHOTOCOAGULATION WITH LASER;  Surgeon: Jalene Mullet, MD;  Location: [place];  Service: Ophthalmology;  Laterality: Right;  Right eye   TEE WITHOUT CARDIOVERSION N/A 11/04/2018   Procedure: TRANSESOPHAGEAL ECHOCARDIOGRAM (TEE);  Surgeon: Wonda Olds, MD;  Location: [place];  Service: Open Heart Surgery;  Laterality: N/A;    Current Medications: No outpatient medications have been marked as taking for the 05/31/21 encounter (Appointment) with Jennifer Hogan, Jennifer Schwab, NP.     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"   Occupation: adjunct facilty at [employer]    Comment: Chemistry professor  Tobacco Use   Smoking status: Never   Smokeless tobacco: Never   Tobacco comments:    Use marijuana  Vaping Use   Vaping Use: Never used  Substance and Sexual Activity   Alcohol use: No    Alcohol/week: 0.0 standard drinks   Drug use: Not  Currently    Types: Marijuana    Comment: last use 04/25/21   Sexual activity: Not Currently    Birth control/protection: None  Other Topics Concern   Not on file  Social History Narrative   Not on file   Social Determinants of Health   Financial Resource Strain: High Risk   Difficulty of Paying Living Expenses: Very hard  Food Insecurity: Food Insecurity Present   Worried About Charity fundraiser in the Last Year: Sometimes true   Ran Out of Food in the Last Year: Sometimes true  Transportation Needs: No Transportation Needs   Lack of Transportation (Medical): No   Lack of Transportation (Non-Medical): No  Physical Activity: Not on file  Stress: Not on file  Social Connections: Not on file     Family History: The patient's ***family history includes Asthma in her father; Cancer in her mother; Congestive Heart Failure in her father; Coronary artery disease in her father.  ROS:   Please see the history of present illness.    *** All  other systems reviewed and are negative.  Labs/Other Studies Reviewed:    The following studies were reviewed today:  Cardiac catheterization 05/11/2021:   Prox LAD to Mid LAD lesion is 100% stenosed.   Dist LAD-1 lesion is 70% stenosed.   Dist LAD-2 lesion is 70% stenosed.   Mid LAD lesion is 60% stenosed.   Ost Cx to Prox Cx lesion is 99% stenosed.   Prox Cx to Mid Cx lesion is 70% stenosed.   Ramus-1 lesion is 60% stenosed.   Ramus-2 lesion is 20% stenosed.   Ramus-3 lesion is 70% stenosed.   RPAV lesion is 60% stenosed.   Non-stenotic Mid LAD to Dist LAD lesion was previously treated.   LIMA graft was visualized by angiography and is normal in caliber.   RIMA graft was visualized by non-selective angiography and is normal in caliber.   The graft exhibits no disease.   LV end diastolic pressure is moderately elevated.   Severe 2 vessel obstructive CAD Patent sequential LIMA graft  to the ramus intermediate and distal LAD Patent RIMA  to the distal LCx Moderately elevated LVEDP   Compared to September 2022 there is no significant angiographic change. Recommend continued medical therapy.   Echocardiogram 05/11/2021:  1. Diffuse hypokinesis worse in septum and apex . Left ventricular  ejection fraction, by estimation, is 30 to 35%. The left ventricle has  moderately decreased function. The left ventricle demonstrates global  hypokinesis. The left ventricular internal  cavity size was severely dilated. Left ventricular diastolic parameters  are indeterminate.   2. Right ventricular systolic function is normal. The right ventricular  size is normal. There is normal pulmonary artery systolic pressure.   3. The mitral valve is abnormal. Mild mitral valve regurgitation. No  evidence of mitral stenosis.   4. The aortic valve is tricuspid. Aortic valve regurgitation is not  visualized. No aortic stenosis is present.   5. The inferior vena cava is normal in size with greater than 50%  respiratory variability, suggesting right atrial pressure of 3 mmHg.    Recent Labs: 05/13/2021: B Natriuretic Peptide 2,640.5 05/15/2021: ALT 15; TSH 2.505 05/17/2021: BUN 8; Creatinine, Ser 0.84; Hemoglobin 10.5; Magnesium 2.0; Platelets 411; Potassium 3.2; Sodium 138  Recent Lipid Panel    Component Value Date/Time   CHOL 258 (H) 05/11/2021 0747   CHOL 236 (H) 03/01/2021 0937   TRIG 64 05/11/2021 0747   HDL 78 05/11/2021 0747   HDL 65 03/01/2021 0937   CHOLHDL 3.3 05/11/2021 0747   VLDL 13 05/11/2021 0747   LDLCALC 167 (H) 05/11/2021 0747   LDLCALC 155 (H) 03/01/2021 0937   LDLDIRECT 109 (H) 12/22/2019 1408     Risk Assessment/Calculations:   {Does this patient have ATRIAL FIBRILLATION?:[phone]}       Physical Exam:    VS:  There were no vitals taken for this visit.    Wt Readings from Last 3 Encounters:  05/17/21 145 lb 8.1 oz (66 kg)  04/27/21 150 lb (68 kg)  03/25/21 138 lb (62.6 kg)     GEN: *** Well nourished,  well developed in no acute distress HEENT: Normal NECK: No JVD; No carotid bruits CARDIAC: ***RRR, no murmurs, rubs, gallops RESPIRATORY:  Clear to auscultation without rales, wheezing or rhonchi  ABDOMEN: Soft, non-tender, non-distended MUSCULOSKELETAL:  No edema; No deformity. *** pedal pulses, ***bilaterally SKIN: Warm and dry NEUROLOGIC:  Alert and oriented x 3 PSYCHIATRIC:  Normal affect   EKG:  EKG is *** ordered today.  The ekg ordered today demonstrates ***  Diagnoses:    No diagnosis found. Assessment and Plan:     CAD s/p CABG Elevated troponin Hyperlipidemia LDL goal < 70: LDL 167 05/11/21.   {Are you ordering a CV Procedure (e.g. stress test, cath, DCCV, TEE, etc)?   Press F2        :[id number]}    Medication Adjustments/Labs and Tests Ordered: Current medicines are reviewed at length with the patient today.  Concerns regarding medicines are outlined above.  No orders of the defined types were placed in this encounter.  No orders of the defined types were placed in this encounter.   There are no Patient Instructions on file for this visit.   Signed, Emmaline Life, NP  05/26/2021 8:23 AM    [place]

## 2021-05-27 NOTE — Op Note (Signed)
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 ?Procedure: Pars plana vitrectomy, membranectomy, endolaser, endocuatery retinotomy, drainage of subretinal fluid. C3F8 gas tamponade ?Operative Eye:  right eye  ?Surgeon: Royston Cowper ?Estimated Blood Loss: minimal ?Specimens for Pathology:  None ?Complications: none ? ? ?The  patient was prepped and draped in the usual fashion for ocular surgery on the  right eye .  A lid speculum was placed.  Infusion line and trocar was placed at the 8 o'clock position approximately 3.5 mm from the surgical limbus.   The infusion line was allowed to run and then clamped when placed at the cannula opening. The line was inserted and secured to the drape with an adhesive strip.   Active trocars/cannula were placed at the 10 and 2 o'clock positions approximately 3.5 mm from the surgical limbus. The cannula was visualized in the vitreous cavity.  The light pipe and vitreous cutter were inserted into the vitreous cavity and a core vitrectomy was performed.  Care taken to remove the vitreous up to the vitreous base for 360 degrees with the aid of scleral depression.  The vitreous over the area of inferotemporal detachment was carefully shaved.   ? ?ILM forceps were used to remove the inferior proliferative membranes.  Endocautery was used to make a posterior retinotomy.  Subretinal fluid was drained.  A complete air/fluid exchange was performed and the retina flattened.   ? ?3 rows of endolaser were applied 360 degrees to the periphery and surrounding the area of detachment.   ? ?14% C3F8 gas was placed in the eye. ? ?The superior cannulas were sequentially removed with concommitant tamponade using a cotton tipped applicator and noted to be air tight.  The infusion line and trocar were removed and the sclerotomy was noted to be air tight with normal intraocular pressure by digital palpapation.  Subconjunctival injections  of antibiotic and Dexamethasone '4mg'$ /69m were placed in the infero-medial quadrant.  ? ?The speculum and drapes were removed and the eye was patched with Polymixin/Bacitracin ophthalmic ointment. An eye shield was placed and the patient was transferred alert and conversant with stable vital signs to the post operative recovery area.  The patient tolerated the procedure well and no complications were noted. ? ?NRoyston CowperMD  ?

## 2021-05-30 ENCOUNTER — Inpatient Hospital Stay (HOSPITAL_COMMUNITY)
Admission: EM | Admit: 2021-05-30 | Discharge: 2021-06-01 | DRG: 638 | Disposition: A | Discharge status: Home / Self Care | Payer: Medicaid Other | Attending: Internal Medicine | Admitting: Internal Medicine

## 2021-05-30 ENCOUNTER — Encounter (HOSPITAL_COMMUNITY): Payer: Self-pay | Admitting: Emergency Medicine

## 2021-05-30 ENCOUNTER — Inpatient Hospital Stay (HOSPITAL_COMMUNITY): Payer: Medicaid Other

## 2021-05-30 ENCOUNTER — Other Ambulatory Visit: Payer: Self-pay

## 2021-05-30 ENCOUNTER — Emergency Department (HOSPITAL_COMMUNITY): Payer: Medicaid Other

## 2021-05-30 DIAGNOSIS — E1059 Type 1 diabetes mellitus with other circulatory complications: Secondary | ICD-10-CM

## 2021-05-30 DIAGNOSIS — E1065 Type 1 diabetes mellitus with hyperglycemia: Secondary | ICD-10-CM

## 2021-05-30 DIAGNOSIS — E1110 Type 2 diabetes mellitus with ketoacidosis without coma: Secondary | ICD-10-CM | POA: Diagnosis not present

## 2021-05-30 DIAGNOSIS — Z951 Presence of aortocoronary bypass graft: Secondary | ICD-10-CM

## 2021-05-30 DIAGNOSIS — Z79890 Hormone replacement therapy (postmenopausal): Secondary | ICD-10-CM | POA: Diagnosis not present

## 2021-05-30 DIAGNOSIS — E7800 Pure hypercholesterolemia, unspecified: Secondary | ICD-10-CM | POA: Diagnosis present

## 2021-05-30 DIAGNOSIS — F32A Depression, unspecified: Secondary | ICD-10-CM | POA: Diagnosis present

## 2021-05-30 DIAGNOSIS — Z794 Long term (current) use of insulin: Secondary | ICD-10-CM

## 2021-05-30 DIAGNOSIS — I5022 Chronic systolic (congestive) heart failure: Secondary | ICD-10-CM | POA: Diagnosis present

## 2021-05-30 DIAGNOSIS — Z8249 Family history of ischemic heart disease and other diseases of the circulatory system: Secondary | ICD-10-CM

## 2021-05-30 DIAGNOSIS — E1043 Type 1 diabetes mellitus with diabetic autonomic (poly)neuropathy: Secondary | ICD-10-CM | POA: Diagnosis present

## 2021-05-30 DIAGNOSIS — I252 Old myocardial infarction: Secondary | ICD-10-CM | POA: Diagnosis not present

## 2021-05-30 DIAGNOSIS — Z79899 Other long term (current) drug therapy: Secondary | ICD-10-CM | POA: Diagnosis not present

## 2021-05-30 DIAGNOSIS — K219 Gastro-esophageal reflux disease without esophagitis: Secondary | ICD-10-CM | POA: Diagnosis present

## 2021-05-30 DIAGNOSIS — Z825 Family history of asthma and other chronic lower respiratory diseases: Secondary | ICD-10-CM | POA: Diagnosis not present

## 2021-05-30 DIAGNOSIS — Z20822 Contact with and (suspected) exposure to covid-19: Secondary | ICD-10-CM | POA: Diagnosis present

## 2021-05-30 DIAGNOSIS — I2510 Atherosclerotic heart disease of native coronary artery without angina pectoris: Secondary | ICD-10-CM | POA: Diagnosis present

## 2021-05-30 DIAGNOSIS — I110 Hypertensive heart disease with heart failure: Secondary | ICD-10-CM | POA: Diagnosis present

## 2021-05-30 DIAGNOSIS — K3184 Gastroparesis: Secondary | ICD-10-CM | POA: Diagnosis present

## 2021-05-30 DIAGNOSIS — R6510 Systemic inflammatory response syndrome (SIRS) of non-infectious origin without acute organ dysfunction: Secondary | ICD-10-CM | POA: Diagnosis present

## 2021-05-30 DIAGNOSIS — E1010 Type 1 diabetes mellitus with ketoacidosis without coma: Secondary | ICD-10-CM | POA: Diagnosis not present

## 2021-05-30 DIAGNOSIS — Z888 Allergy status to other drugs, medicaments and biological substances status: Secondary | ICD-10-CM | POA: Diagnosis not present

## 2021-05-30 DIAGNOSIS — Z7902 Long term (current) use of antithrombotics/antiplatelets: Secondary | ICD-10-CM

## 2021-05-30 DIAGNOSIS — R339 Retention of urine, unspecified: Secondary | ICD-10-CM | POA: Diagnosis present

## 2021-05-30 DIAGNOSIS — E860 Dehydration: Secondary | ICD-10-CM | POA: Diagnosis present

## 2021-05-30 DIAGNOSIS — E039 Hypothyroidism, unspecified: Secondary | ICD-10-CM | POA: Diagnosis present

## 2021-05-30 DIAGNOSIS — Z955 Presence of coronary angioplasty implant and graft: Secondary | ICD-10-CM

## 2021-05-30 DIAGNOSIS — N179 Acute kidney failure, unspecified: Secondary | ICD-10-CM | POA: Diagnosis present

## 2021-05-30 DIAGNOSIS — F419 Anxiety disorder, unspecified: Secondary | ICD-10-CM | POA: Diagnosis present

## 2021-05-30 DIAGNOSIS — Z807 Family history of other malignant neoplasms of lymphoid, hematopoietic and related tissues: Secondary | ICD-10-CM

## 2021-05-30 DIAGNOSIS — E1143 Type 2 diabetes mellitus with diabetic autonomic (poly)neuropathy: Secondary | ICD-10-CM | POA: Diagnosis present

## 2021-05-30 DIAGNOSIS — I5023 Acute on chronic systolic (congestive) heart failure: Secondary | ICD-10-CM | POA: Diagnosis present

## 2021-05-30 DIAGNOSIS — Z7982 Long term (current) use of aspirin: Secondary | ICD-10-CM | POA: Diagnosis not present

## 2021-05-30 LAB — CBC WITH DIFFERENTIAL/PLATELET
Abs Immature Granulocytes: 0.06 10*3/uL (ref 0.00–0.07)
Basophils Absolute: 0.1 10*3/uL (ref 0.0–0.1)
Basophils Relative: 0 %
Eosinophils Absolute: 0 10*3/uL (ref 0.0–0.5)
Eosinophils Relative: 0 %
HCT: 40.5 % (ref 36.0–46.0)
Hemoglobin: 13 g/dL (ref 12.0–15.0)
Immature Granulocytes: 0 %
Lymphocytes Relative: 5 %
Lymphs Abs: 0.9 10*3/uL (ref 0.7–4.0)
MCH: 30 pg (ref 26.0–34.0)
MCHC: 32.1 g/dL (ref 30.0–36.0)
MCV: 93.5 fL (ref 80.0–100.0)
Monocytes Absolute: 0.3 10*3/uL (ref 0.1–1.0)
Monocytes Relative: 2 %
Neutro Abs: 16.2 10*3/uL — ABNORMAL HIGH (ref 1.7–7.7)
Neutrophils Relative %: 93 %
Platelets: 564 10*3/uL — ABNORMAL HIGH (ref 150–400)
RBC: 4.33 MIL/uL (ref 3.87–5.11)
RDW: 13.7 % (ref 11.5–15.5)
WBC: 17.5 10*3/uL — ABNORMAL HIGH (ref 4.0–10.5)
nRBC: 0 % (ref 0.0–0.2)

## 2021-05-30 LAB — COMPREHENSIVE METABOLIC PANEL
ALT: 24 U/L (ref 0–44)
AST: 26 U/L (ref 15–41)
Albumin: 4.1 g/dL (ref 3.5–5.0)
Alkaline Phosphatase: 79 U/L (ref 38–126)
Anion gap: 28 — ABNORMAL HIGH (ref 5–15)
BUN: 25 mg/dL — ABNORMAL HIGH (ref 6–20)
CO2: 8 mmol/L — ABNORMAL LOW (ref 22–32)
Calcium: 9.7 mg/dL (ref 8.9–10.3)
Chloride: 94 mmol/L — ABNORMAL LOW (ref 98–111)
Creatinine, Ser: 1.5 mg/dL — ABNORMAL HIGH (ref 0.44–1.00)
GFR, Estimated: 42 mL/min — ABNORMAL LOW (ref >60)
Glucose, Bld: 622 mg/dL (ref 70–99)
Potassium: 4.9 mmol/L (ref 3.5–5.1)
Sodium: 130 mmol/L — ABNORMAL LOW (ref 135–145)
Total Bilirubin: 2.3 mg/dL — ABNORMAL HIGH (ref 0.3–1.2)
Total Protein: 7.6 g/dL (ref 6.5–8.1)

## 2021-05-30 LAB — I-STAT VENOUS BLOOD GAS, ED
Acid-base deficit: 15 mmol/L — ABNORMAL HIGH (ref 0.0–2.0)
Bicarbonate: 9.6 mmol/L — ABNORMAL LOW (ref 20.0–28.0)
Calcium, Ion: 1.1 mmol/L — ABNORMAL LOW (ref 1.15–1.40)
HCT: 43 % (ref 36.0–46.0)
Hemoglobin: 14.6 g/dL (ref 12.0–15.0)
O2 Saturation: 93 %
Potassium: 4.9 mmol/L (ref 3.5–5.1)
Sodium: 128 mmol/L — ABNORMAL LOW (ref 135–145)
TCO2: 10 mmol/L — ABNORMAL LOW (ref 22–32)
pCO2, Ven: 21 mmHg — ABNORMAL LOW (ref 44–60)
pH, Ven: 7.268 (ref 7.25–7.43)
pO2, Ven: 74 mmHg — ABNORMAL HIGH (ref 32–45)

## 2021-05-30 LAB — BASIC METABOLIC PANEL
Anion gap: 29 — ABNORMAL HIGH (ref 5–15)
Anion gap: 14 (ref 5–15)
BUN: 26 mg/dL — ABNORMAL HIGH (ref 6–20)
BUN: 25 mg/dL — ABNORMAL HIGH (ref 6–20)
CO2: 7 mmol/L — ABNORMAL LOW (ref 22–32)
CO2: 18 mmol/L — ABNORMAL LOW (ref 22–32)
Calcium: 10 mg/dL (ref 8.9–10.3)
Calcium: 10 mg/dL (ref 8.9–10.3)
Chloride: 95 mmol/L — ABNORMAL LOW (ref 98–111)
Chloride: 104 mmol/L (ref 98–111)
Creatinine, Ser: 1.55 mg/dL — ABNORMAL HIGH (ref 0.44–1.00)
Creatinine, Ser: 1.43 mg/dL — ABNORMAL HIGH (ref 0.44–1.00)
GFR, Estimated: 40 mL/min — ABNORMAL LOW (ref >60)
GFR, Estimated: 44 mL/min — ABNORMAL LOW (ref >60)
Glucose, Bld: 612 mg/dL (ref 70–99)
Glucose, Bld: 214 mg/dL — ABNORMAL HIGH (ref 70–99)
Potassium: 4.8 mmol/L (ref 3.5–5.1)
Potassium: 4.5 mmol/L (ref 3.5–5.1)
Sodium: 131 mmol/L — ABNORMAL LOW (ref 135–145)
Sodium: 136 mmol/L (ref 135–145)

## 2021-05-30 LAB — CBG MONITORING, ED
Glucose-Capillary: 205 mg/dL — ABNORMAL HIGH (ref 70–99)
Glucose-Capillary: 221 mg/dL — ABNORMAL HIGH (ref 70–99)
Glucose-Capillary: 276 mg/dL — ABNORMAL HIGH (ref 70–99)
Glucose-Capillary: 326 mg/dL — ABNORMAL HIGH (ref 70–99)
Glucose-Capillary: 381 mg/dL — ABNORMAL HIGH (ref 70–99)
Glucose-Capillary: 435 mg/dL — ABNORMAL HIGH (ref 70–99)
Glucose-Capillary: 524 mg/dL (ref 70–99)
Glucose-Capillary: 597 mg/dL (ref 70–99)
Glucose-Capillary: >600 mg/dL (ref 70–99)

## 2021-05-30 LAB — RESP PANEL BY RT-PCR (FLU A&B, COVID) ARPGX2
Influenza A by PCR: NEGATIVE
Influenza B by PCR: NEGATIVE
SARS Coronavirus 2 by RT PCR: NEGATIVE

## 2021-05-30 LAB — LIPASE, BLOOD: Lipase: 21 U/L (ref 11–51)

## 2021-05-30 LAB — URINALYSIS, ROUTINE W REFLEX MICROSCOPIC
Bacteria, UA: NONE SEEN
Bilirubin Urine: NEGATIVE
Glucose, UA: >=500 mg/dL — AB
Hgb urine dipstick: NEGATIVE
Ketones, ur: 80 mg/dL — AB
Leukocytes,Ua: NEGATIVE
Nitrite: NEGATIVE
Protein, ur: NEGATIVE mg/dL
Specific Gravity, Urine: 1.022 (ref 1.005–1.030)
pH: 5 (ref 5.0–8.0)

## 2021-05-30 LAB — I-STAT BETA HCG BLOOD, ED (MC, WL, AP ONLY): I-stat hCG, quantitative: <5 m[IU]/mL (ref <5)

## 2021-05-30 LAB — BETA-HYDROXYBUTYRIC ACID
Beta-Hydroxybutyric Acid: 6.83 mmol/L — ABNORMAL HIGH (ref 0.05–0.27)
Beta-Hydroxybutyric Acid: 2.73 mmol/L — ABNORMAL HIGH (ref 0.05–0.27)

## 2021-05-30 LAB — TROPONIN I (HIGH SENSITIVITY)
Troponin I (High Sensitivity): 14 ng/L (ref <18)
Troponin I (High Sensitivity): 12 ng/L (ref <18)

## 2021-05-30 LAB — MAGNESIUM: Magnesium: 2.1 mg/dL (ref 1.7–2.4)

## 2021-05-30 MED ORDER — BUDESONIDE 0.25 MG/2ML IN SUSP
0.2500 mg | Freq: Two times a day (BID) | RESPIRATORY_TRACT | Status: DC
  Administered 2021-05-31 – 2021-06-01 (×3): 0.25 mg via RESPIRATORY_TRACT
  Filled 2021-05-30 (×3): qty 2

## 2021-05-30 MED ORDER — SODIUM CHLORIDE 0.9 % IV SOLN
25.0000 mg | Freq: Four times a day (QID) | INTRAVENOUS | Status: DC | PRN
  Administered 2021-05-30: 25 mg via INTRAVENOUS
  Filled 2021-05-30: qty 1

## 2021-05-30 MED ORDER — FLUOXETINE HCL 20 MG PO CAPS
40.0000 mg | ORAL_CAPSULE | Freq: Every day | ORAL | Status: DC
  Administered 2021-05-31 – 2021-06-01 (×2): 40 mg via ORAL
  Filled 2021-05-30 (×2): qty 2

## 2021-05-30 MED ORDER — MORPHINE SULFATE (PF) 4 MG/ML IV SOLN
4.0000 mg | Freq: Once | INTRAVENOUS | Status: AC
  Administered 2021-05-30: 4 mg via INTRAVENOUS
  Filled 2021-05-30: qty 1

## 2021-05-30 MED ORDER — INSULIN REGULAR(HUMAN) IN NACL 100-0.9 UT/100ML-% IV SOLN
INTRAVENOUS | Status: DC
  Administered 2021-05-30: 7 [IU]/h via INTRAVENOUS
  Filled 2021-05-30: qty 100

## 2021-05-30 MED ORDER — CLOPIDOGREL BISULFATE 75 MG PO TABS
75.0000 mg | ORAL_TABLET | Freq: Every day | ORAL | Status: DC
  Administered 2021-05-31 – 2021-06-01 (×2): 75 mg via ORAL
  Filled 2021-05-30 (×2): qty 1

## 2021-05-30 MED ORDER — OFLOXACIN 0.3 % OP SOLN: 1.0000 [drp] | Freq: Four times a day (QID) | OPHTHALMIC | Status: DC

## 2021-05-30 MED ORDER — DICLOFENAC SODIUM 1 % EX GEL
4.0000 g | Freq: Four times a day (QID) | CUTANEOUS | Status: DC | PRN
  Filled 2021-05-30: qty 100

## 2021-05-30 MED ORDER — DEXTROSE 50 % IV SOLN: 0.0000 mL | INTRAVENOUS | Status: DC | PRN

## 2021-05-30 MED ORDER — METOCLOPRAMIDE HCL 5 MG/ML IJ SOLN
10.0000 mg | Freq: Four times a day (QID) | INTRAMUSCULAR | Status: DC | PRN
  Administered 2021-05-30: 10 mg via INTRAVENOUS
  Filled 2021-05-30: qty 2

## 2021-05-30 MED ORDER — ASPIRIN EC 81 MG PO TBEC
81.0000 mg | DELAYED_RELEASE_TABLET | Freq: Every day | ORAL | Status: DC
Start: 2021-05-30 — End: 2021-06-01
  Administered 2021-05-30 – 2021-06-01 (×3): 81 mg via ORAL
  Filled 2021-05-30 (×3): qty 1

## 2021-05-30 MED ORDER — MONTELUKAST SODIUM 10 MG PO TABS
10.0000 mg | ORAL_TABLET | Freq: Every day | ORAL | Status: DC
Start: 2021-05-30 — End: 2021-06-01
  Administered 2021-05-30 – 2021-05-31 (×2): 10 mg via ORAL
  Filled 2021-05-30 (×3): qty 1

## 2021-05-30 MED ORDER — MORPHINE SULFATE (PF) 4 MG/ML IV SOLN: 4.0000 mg | INTRAVENOUS | Status: DC | PRN

## 2021-05-30 MED ORDER — HYDROXYZINE HCL 25 MG PO TABS: 25.0000 mg | ORAL_TABLET | Freq: Three times a day (TID) | ORAL | Status: DC | PRN

## 2021-05-30 MED ORDER — PREDNISOLONE ACETATE 1 % OP SUSP: 1.0000 [drp] | Freq: Four times a day (QID) | OPHTHALMIC | Status: DC

## 2021-05-30 MED ORDER — PANTOPRAZOLE SODIUM 40 MG PO TBEC
40.0000 mg | DELAYED_RELEASE_TABLET | Freq: Every day | ORAL | Status: DC | PRN
Start: 2021-05-30 — End: 2021-06-01

## 2021-05-30 MED ORDER — ACETAMINOPHEN 325 MG PO TABS: 325.0000 mg | ORAL_TABLET | Freq: Every day | ORAL | Status: DC | PRN

## 2021-05-30 MED ORDER — ALBUTEROL SULFATE (2.5 MG/3ML) 0.083% IN NEBU: 3.0000 mL | INHALATION_SOLUTION | Freq: Four times a day (QID) | RESPIRATORY_TRACT | Status: DC | PRN

## 2021-05-30 MED ORDER — BRIMONIDINE TARTRATE 0.2 % OP SOLN
1.0000 [drp] | Freq: Two times a day (BID) | OPHTHALMIC | Status: DC
  Administered 2021-05-30 – 2021-06-01 (×4): 1 [drp] via OPHTHALMIC
  Filled 2021-05-30: qty 5

## 2021-05-30 MED ORDER — POTASSIUM CHLORIDE 10 MEQ/100ML IV SOLN
10.0000 meq | INTRAVENOUS | Status: AC
  Filled 2021-05-30: qty 100

## 2021-05-30 MED ORDER — LABETALOL HCL 5 MG/ML IV SOLN: 20.0000 mg | INTRAVENOUS | Status: DC | PRN

## 2021-05-30 MED ORDER — ENOXAPARIN SODIUM 30 MG/0.3ML IJ SOSY
30.0000 mg | PREFILLED_SYRINGE | INTRAMUSCULAR | Status: DC
  Administered 2021-05-30: 30 mg via SUBCUTANEOUS
  Filled 2021-05-30: qty 0.3

## 2021-05-30 MED ORDER — FLUTICASONE PROPIONATE 50 MCG/ACT NA SUSP
1.0000 | Freq: Every evening | NASAL | Status: DC | PRN
  Filled 2021-05-30: qty 16

## 2021-05-30 MED ORDER — LACTATED RINGERS IV SOLN: INTRAVENOUS | Status: DC

## 2021-05-30 MED ORDER — DIPHENHYDRAMINE HCL 25 MG PO CAPS
25.0000 mg | ORAL_CAPSULE | Freq: Every day | ORAL | Status: DC
  Administered 2021-05-30 – 2021-05-31 (×2): 25 mg via ORAL
  Filled 2021-05-30 (×2): qty 1

## 2021-05-30 MED ORDER — LACTATED RINGERS IV BOLUS
2000.0000 mL | Freq: Once | INTRAVENOUS | Status: AC
  Administered 2021-05-30: 2000 mL via INTRAVENOUS

## 2021-05-30 MED ORDER — DEXTROSE IN LACTATED RINGERS 5 % IV SOLN: INTRAVENOUS | Status: DC

## 2021-05-30 MED ORDER — LEVOTHYROXINE SODIUM 100 MCG PO TABS
100.0000 ug | ORAL_TABLET | Freq: Every day | ORAL | Status: DC
  Administered 2021-05-31 – 2021-06-01 (×2): 100 ug via ORAL
  Filled 2021-05-30 (×2): qty 1

## 2021-05-30 MED ORDER — LACTATED RINGERS IV BOLUS: 20.0000 mL/kg | Freq: Once | INTRAVENOUS | Status: DC

## 2021-05-30 NOTE — ED Provider Triage Note (Signed)
Emergency Medicine Provider Triage Evaluation Note ? ?Jennifer Hogan , a 51 y.o. female  was evaluated in triage.  Pt complains of generalized weakness, nausea, vomiting, diarrhea, and shortness of breath.  Patient reports that her symptoms started earlier this morning.  Patient is concerned that she may be dehydrated due to her vomiting and diarrhea.  Patient is unsure have any time she has vomited in the last 24 hours.  Describes emesis as bilious. ? ?Denies any fever, chills, chest pain, blood in stool, melena, dysuria, hematuria, vaginal pain, vaginal bleeding, vaginal discharge, hematemesis, coffee-ground emesis. ? ?Review of Systems  ?Positive: See above ?Negative: See above ? ?Physical Exam  ?BP 101/74 (BP Location: Left Arm)   Pulse (!) 117   Temp 98.5 ?F (36.9 ?C) (Oral)   Resp (!) 22   Ht '5\' 5"'$  (1.651 m)   Wt 66 kg   SpO2 100%   BMI 24.21 kg/m?  ?Gen:   Awake, no distress   ?Resp:  Normal effort, clear to auscultation bilaterally ?MSK:   Moves extremities without difficulty  ?Other:  +2 radial pulse bilaterally.  Abdomen soft, nondistended, nontender no guarding or rebound tenderness. ? ?Medical Decision Making  ?Medically screening exam initiated at 2:04 PM.  Appropriate orders placed.  Jennifer Hogan was informed that the remainder of the evaluation will be completed by another provider, this initial triage assessment does not replace that evaluation, and the importance of remaining in the ED until their evaluation is complete. ? ? ?  ?Loni Beckwith, PA-C ?05/30/21 1411 ? ?

## 2021-05-30 NOTE — ED Notes (Addendum)
CBG 205 mg ?

## 2021-05-30 NOTE — H&P (Addendum)
?History and Physical  ? ? ?Jennifer Hogan UUV:[id number] DOB: 1971-01-22 DOA: 05/30/2021 ? ?PCP: [name], NP (Confirm with patient/family/NH records and if not entered, this has to be entered at [facility] point of entry) ?Patient coming from: Home ? ?I have personally briefly reviewed patient's old medical records in [place] ? ?Chief Complaint: Belly hurts, nausea and vomiting ? ?HPI: Jennifer Hogan is a 51 y.o. female with medical history significant of Chronic systolic CHF secondary to ischemic cardiomyopathy, CAD s/p stenting, IDDM, hypothyroid, gastroparesis, maijuana use, came with epigastric pain, frequent N/V and diarrhea. ? ?Symptoms started 3 days ago with new onset of frequent feeling nauseous vomiting, epigastric cramping pain and loose diarrhea, denies any tenesmus.  No fever or chills.  Next day, diarrhea resolved however epigastric pain, nauseous vomiting continued.  Vomitus including stomach content, nonbloody nonbilious.  Unable to eat or drink whole day yesterday.  Feeling very dehydrated. ? ?ED Course: Tachycardia, blood pressure significant elevated. ? ?Blood work sodium 130, potassium 4.9, bicarb 18, creatinine 1.5, glucose 622, WBC 17.5, VBG 7.2 07/13/72.  Lipase negative ? ?CT abdomen pelvis showed distended bladder ? ?Total of 2 L IV bolus given, patient was started on insulin drip. ? ?Review of Systems: As per HPI otherwise 14 point review of systems negative.  ? ? ?Past Medical History:  ?Diagnosis Date  ? Anemia   ? Anxiety   ? Coronary artery disease   ? a. s/p PTCA DES x3 in the LAD (ostial DES placed, mid DES placed, distal DES placed) and PTCA/DES x1 to intermediate branch 02/2017. b. ACS 02/08/18 with LCx stent placement.  ? Depression   ? Diabetes mellitus   ? type 1, patient is not using her insulin pump.  ? DKA (diabetic ketoacidoses) 12/31/2017  ? Dyspnea   ? with exertion and dust, no oxygen  ? Elevated platelet count   ? GERD (gastroesophageal reflux disease)   ?  High cholesterol   ? Hypothyroidism   ? Ischemic cardiomyopathy   ? a. EF low-normal with basal inferior akinesis, basal septal hypokinesis by echo 01/2018.  ? Myocardial infarction [facility]) 2019  ? ? ?Past Surgical History:  ?Procedure Laterality Date  ? APPENDECTOMY    ? CORONARY ARTERY BYPASS GRAFT N/A 11/04/2018  ? Procedure: CORONARY ARTERY BYPASS GRAFTING (CABG), ON PUMP, TIMES THREE, USING LEFT AND RIGHT INTERNAL MAMMARY ARTERIES;  Surgeon: Wonda Olds, MD;  Location: [place];  Service: Open Heart Surgery;  Laterality: N/A;  ? CORONARY STENT INTERVENTION N/A 03/20/2017  ? Procedure: DES x 3 LAD, DES OM; Surgeon: Burnell Blanks, MD;  Location: [place] [facility];  Service: Cardiovascular;  Laterality: N/A;  ? CORONARY/GRAFT ACUTE MI REVASCULARIZATION N/A 02/08/2018  ? Procedure: Coronary/Graft Acute MI Revascularization;  Surgeon: Belva Crome, MD;  Location: [place] [facility];  Service: Cardiovascular;  Laterality: N/A;  ? GAS INSERTION Right 04/28/2021  ? Procedure: INSERTION OF GAS ( C3F8);  Surgeon: Jalene Mullet, MD;  Location: [place];  Service: Ophthalmology;  Laterality: Right;  Right eye  ? GAS/FLUID EXCHANGE Right 04/28/2021  ? Procedure: GAS/FLUID EXCHANGE;  Surgeon: Jalene Mullet, MD;  Location: [place];  Service: Ophthalmology;  Laterality: Right;  Right eye  ? LEFT HEART CATH AND CORONARY ANGIOGRAPHY N/A 03/20/2017  ? Procedure: LEFT HEART CATH AND CORONARY ANGIOGRAPHY;  Surgeon: Burnell Blanks, MD;  Location: [place] [facility];  Service: Cardiovascular;  Laterality: N/A;  ? LEFT HEART CATH AND CORONARY ANGIOGRAPHY  N/A 02/08/2018  ? Procedure: LEFT HEART CATH AND CORONARY ANGIOGRAPHY;  Surgeon: Belva Crome, MD;  Location: [place] [facility];  Service: Cardiovascular;  Laterality: N/A;  ? LEFT HEART CATH AND CORONARY ANGIOGRAPHY N/A 10/29/2018  ? Procedure: LEFT HEART CATH AND CORONARY ANGIOGRAPHY;  Surgeon: Burnell Blanks, MD;  Location: [place] [facility];  Service:  Cardiovascular;  Laterality: N/A;  ? LEFT HEART CATH AND CORS/GRAFTS ANGIOGRAPHY N/A 10/04/2020  ? Procedure: LEFT HEART CATH AND CORS/GRAFTS ANGIOGRAPHY;  Surgeon: Sherren Mocha, MD;  Location: [place] [facility];  Service: Cardiovascular;  Laterality: N/A;  ? LEFT HEART CATH AND CORS/GRAFTS ANGIOGRAPHY N/A 05/11/2021  ? Procedure: LEFT HEART CATH AND CORS/GRAFTS ANGIOGRAPHY;  Surgeon: Martinique, Peter M, MD;  Location: [place] [facility];  Service: Cardiovascular;  Laterality: N/A;  ? PARS PLANA VITRECTOMY Right 04/28/2021  ? Procedure: PARS PLANA VITRECTOMY WITH 25 GAUGE REMOVAL OF TRATIONAL MEMBRANE RIGHT EYE; DRAINAGE OF SUBRETINAL FLUID RIGHT EYE;  Surgeon: Jalene Mullet, MD;  Location: [place];  Service: Ophthalmology;  Laterality: Right;  Right eye  ? PHOTOCOAGULATION WITH LASER Right 04/28/2021  ? Procedure: PHOTOCOAGULATION WITH LASER;  Surgeon: Jalene Mullet, MD;  Location: [place];  Service: Ophthalmology;  Laterality: Right;  Right eye  ? TEE WITHOUT CARDIOVERSION N/A 11/04/2018  ? Procedure: TRANSESOPHAGEAL ECHOCARDIOGRAM (TEE);  Surgeon: Wonda Olds, MD;  Location: [place];  Service: Open Heart Surgery;  Laterality: N/A;  ? ? ? reports that she has never smoked. She has never used smokeless tobacco. She reports that she does not currently use drugs after having used the following drugs: Marijuana. She reports that she does not drink alcohol. ? ?Allergies  ?Allergen Reactions  ? Atorvastatin Other (See Comments)  ?  Myalgias on '80mg'$  dose, tolerates '20mg'$  ok  ? Crestor [Rosuvastatin Calcium] Other (See Comments)  ?  Severe myalgias and joint pain. Has tolerated atorvastatin though  ? Monosodium Glutamate Nausea And Vomiting and Other (See Comments)  ?  migraine  ? Zetia [Ezetimibe] Other (See Comments)  ?  myalgias  ? Ciprofloxin Hcl [Ciprofloxacin] Nausea And Vomiting and Other (See Comments)  ?  Severe migraine  ? Food Color Red [Red Dye] Diarrhea  ? ? ?Family History  ?Problem Relation Age of Onset  ?  Cancer Mother   ?     T cell lymphoma  ? Coronary artery disease Father   ? Congestive Heart Failure Father   ? Asthma Father   ? ? ? ?Prior to Admission medications   ?Medication Sig Start Date End Date Taking? Authorizing Provider  ?acetaminophen (TYLENOL) 325 MG tablet Take 325-650 mg by mouth daily as needed for mild pain, fever or headache.    [provider]  ?albuterol (VENTOLIN HFA) 108 (90 Base) MCG/ACT inhaler INHALE 2 PUFFS INTO THE LUNGS EVERY 6 (SIX) HOURS AS NEEDED FOR WHEEZING OR SHORTNESS OF BREATH. ?Patient taking differently: Inhale 1-2 puffs into the lungs every 6 (six) hours as needed for wheezing or shortness of breath. 12/01/20 12/01/21  Charlott Rakes, MD  ?aspirin 81 MG EC tablet Take 1 tablet (81 mg total) by mouth daily. ?Patient taking differently: Take 81 mg by mouth at bedtime. 12/07/17   Amin, Jeanella Flattery, MD  ?atorvastatin (LIPITOR) 20 MG tablet Take 1 tablet (20 mg total) by mouth daily. ?Patient not taking: Reported on 05/11/2021 10/07/20   Swayze, Ava, DO  ?b complex vitamins tablet Take 1 tablet by mouth daily.    [provider]  ?  brimonidine (ALPHAGAN) 0.15 % ophthalmic solution Place 1 drop into the right eye 2 (two) times daily. 04/29/21   Jalene Mullet, MD  ?budesonide (PULMICORT) 0.25 MG/2ML nebulizer solution Take 0.25 mg by nebulization as needed (shortness of breath/wheezing).    [provider]  ?clopidogrel (PLAVIX) 75 MG tablet TAKE 1 TABLET BY MOUTH DAILY WITH BREAKFAST. ?Patient taking differently: Take 75 mg by mouth at bedtime. 11/01/20   [name], MD  ?diclofenac Sodium (VOLTAREN) 1 % GEL Apply 2-4 g topically as needed (knee pain).    [provider]  ?diphenhydrAMINE (BENADRYL) 25 MG tablet Take 25 mg by mouth at bedtime.    [provider]  ?FLUoxetine (PROZAC) 40 MG capsule TAKE 1 CAPSULE BY MOUTH DAILY. 05/24/21 05/24/22  Charlott Rakes, MD  ?fluticasone (FLONASE) 50 MCG/ACT nasal spray Place 1 spray into both  nostrils at bedtime as needed for allergies.    [provider]  ?furosemide (LASIX) 20 MG tablet Take 1 tablet (20 mg total) by mouth daily as needed (for weight gain). 10/14/20   Conrad [place], NP  ?Sharlee Blew

## 2021-05-30 NOTE — Progress Notes (Signed)
TRH night cross cover note:  ? ?Assisting with cross cover: ? ?I was notified by RN of the patient's concern regarding increased risk for volume overload in the setting of her h/o chronic systolic heart failure.  ? ?Per my chart review, including review of admitting hospitalist H&P, this is a 19 F with h/o chronic systolic heart failure (most recent echo on 05/11/21 notable for LV EF 30 - 35%), who was admitted earlier today with DKA. In the absence of concomitant sepsis, she was ordered a total IVF bolus of 3,320 cc's of which she has now received the first 2 L's. At this time the patient conveys her concerns regarding impending receipt of the final 1,320 cc's of the intended IVF bolus due her concerns for increased risk of volume overload given the above history of CHF. She is currently without shortness of breath, and is non-tachypenic, with O2 sats noted to be 100% on RA. Per review of most recent additional VS, initial tachycardia is now significantly improved, with heart rates now in the low 100s relative to initial values in the 130s to 140s.  Patient appears normotensive, with systolic blood pressures in the 130s mmHg. consequently, I have discontinued the final 1320 cc's of the patient's IV fluid bolus and have confirmed existing order to transition to continuous IV fluids following completion of IV fluid bolus. ? ? ? ?Babs Bertin, DO ?Hospitalist ? ?

## 2021-05-30 NOTE — ED Triage Notes (Signed)
Pt arrives via EMS from home with weakness since today. States she has had n/v/d and is very dehydrated.  ?

## 2021-05-30 NOTE — ED Provider Notes (Signed)
?[facility] ?Provider Note ? ? ?CSN: [id number] ?Arrival date & time: 05/30/21  1359 ? ?  ? ?History ? ?Chief Complaint  ?Patient presents with  ? Weakness  ? Emesis  ? ? ?Jennifer Hogan is a 51 y.o. female. ? ?The history is provided by the patient.  ?Emesis ?Severity:  Moderate ?Duration:  2 days ?Timing:  Constant ?Progression:  Unchanged ?Chronicity:  Recurrent ?Relieved by:  Nothing ?Worsened by:  Nothing ?Associated symptoms: abdominal pain   ?Associated symptoms: no arthralgias, no chills, no cough, no diarrhea, no fever, no headaches, no myalgias, no sore throat and no URI   ?Risk factors: diabetes   ? ?  ? ?Home Medications ?Prior to Admission medications   ?Medication Sig Start Date End Date Taking? Authorizing Provider  ?acetaminophen (TYLENOL) 325 MG tablet Take 325-650 mg by mouth daily as needed for mild pain, fever or headache.    [provider]  ?albuterol (VENTOLIN HFA) 108 (90 Base) MCG/ACT inhaler INHALE 2 PUFFS INTO THE LUNGS EVERY 6 (SIX) HOURS AS NEEDED FOR WHEEZING OR SHORTNESS OF BREATH. ?Patient taking differently: Inhale 1-2 puffs into the lungs every 6 (six) hours as needed for wheezing or shortness of breath. 12/01/20 12/01/21  Charlott Rakes, MD  ?aspirin 81 MG EC tablet Take 1 tablet (81 mg total) by mouth daily. ?Patient taking differently: Take 81 mg by mouth at bedtime. 12/07/17   Amin, Jeanella Flattery, MD  ?atorvastatin (LIPITOR) 20 MG tablet Take 1 tablet (20 mg total) by mouth daily. ?Patient not taking: Reported on 05/11/2021 10/07/20   Swayze, Ava, DO  ?b complex vitamins tablet Take 1 tablet by mouth daily.    [provider]  ?brimonidine (ALPHAGAN) 0.15 % ophthalmic solution Place 1 drop into the right eye 2 (two) times daily. 04/29/21   Jalene Mullet, MD  ?budesonide (PULMICORT) 0.25 MG/2ML nebulizer solution Take 0.25 mg by nebulization as needed (shortness of breath/wheezing).    [provider]  ?clopidogrel  (PLAVIX) 75 MG tablet TAKE 1 TABLET BY MOUTH DAILY WITH BREAKFAST. ?Patient taking differently: Take 75 mg by mouth at bedtime. 11/01/20   [name], MD  ?diclofenac Sodium (VOLTAREN) 1 % GEL Apply 2-4 g topically as needed (knee pain).    [provider]  ?diphenhydrAMINE (BENADRYL) 25 MG tablet Take 25 mg by mouth at bedtime.    [provider]  ?FLUoxetine (PROZAC) 40 MG capsule TAKE 1 CAPSULE BY MOUTH DAILY. 05/24/21 05/24/22  Charlott Rakes, MD  ?fluticasone (FLONASE) 50 MCG/ACT nasal spray Place 1 spray into both nostrils at bedtime as needed for allergies.    [provider]  ?furosemide (LASIX) 20 MG tablet Take 1 tablet (20 mg total) by mouth daily as needed (for weight gain). 10/14/20   Clegg, Amy D, NP  ?hydrOXYzine (ATARAX/VISTARIL) 25 MG tablet Take 1 tablet (25 mg total) by mouth 3 (three) times daily as needed. ?Patient taking differently: Take 25 mg by mouth daily as needed for itching (allergies). 03/19/19   [name], NP  ?Insulin Human (INSULIN PUMP) SOLN Inject into the skin continuous. Humalog    [provider]  ?insulin lispro (HUMALOG) 100 UNIT/ML injection Inject 35 units into the skin daily via pump 04/30/21 04/30/22  Renato Shin, MD  ?levothyroxine (SYNTHROID) 100 MCG tablet TAKE 1 TABLET (100 MCG TOTAL) BY MOUTH DAILY BEFORE BREAKFAST. ?Patient taking differently: Take 100 mcg by mouth daily before breakfast. 12/01/20 12/01/21  Renato Shin, MD  ?metoCLOPramide (  REGLAN) 5 MG tablet Take 1 tablet (5 mg total) by mouth every 12 (twelve) hours as needed for nausea or vomiting. 01/13/21 01/13/22  Charlott Rakes, MD  ?metoprolol succinate (TOPROL-XL) 25 MG 24 hr tablet Take 0.5 tablets (12.5 mg total) by mouth daily. ?Patient taking differently: Take 12.5 mg by mouth at bedtime. 03/07/21   Bensimhon, Shaune Pascal, MD  ?montelukast (SINGULAIR) 10 MG tablet Take 1 tablet (10 mg total) by mouth at bedtime. 01/13/21   [name], NP  ?Multiple  Vitamins-Minerals (ALIVE WOMENS 50+ GUMMY) CHEW Chew 2 capsules by mouth daily.    [provider]  ?nitroGLYCERIN (NITROSTAT) 0.4 MG SL tablet Place 1 tablet (0.4 mg total) under the tongue every 5 (five) minutes as needed for chest pain. 10/14/20   Clegg, Amy D, NP  ?ofloxacin (OCUFLOX) 0.3 % ophthalmic solution Place 1 drop into affected eye(s) 4 (four) times daily. 04/29/21   Jalene Mullet, MD  ?omeprazole (PRILOSEC OTC) 20 MG tablet Take 20 mg by mouth daily as needed (acid reflux).    [provider]  ?OVER THE COUNTER MEDICATION Take 3 capsules by mouth daily before breakfast. [employer] super lysine immune support otc supplement    [provider]  ?OVER THE COUNTER MEDICATION Take 25 mg by mouth at bedtime as needed (sleep/joint pain). CBD oil    [provider]  ?prednisoLONE acetate (PRED FORTE) 1 % ophthalmic suspension Place 1 drop into affected eyes. three times a day for 1 week then  twice a day  for one week then once daily for one week 04/29/21   Jalene Mullet, MD  ?   ? ?Allergies    ?Atorvastatin, Crestor [rosuvastatin calcium], Monosodium glutamate, Zetia [ezetimibe], Ciprofloxin hcl [ciprofloxacin], and Food color red [red dye]   ? ?Review of Systems   ?Review of Systems  ?Constitutional:  Negative for chills and fever.  ?HENT:  Negative for sore throat.   ?Respiratory:  Negative for cough.   ?Gastrointestinal:  Positive for abdominal pain. Negative for diarrhea.  ?Musculoskeletal:  Negative for arthralgias and myalgias.  ?Neurological:  Negative for headaches.  ? ?Physical Exam ? ?ED Triage Vitals  ?Enc Vitals Group  ?   BP 05/30/21 1407 101/74  ?   Pulse Rate 05/30/21 1407 (!) 117  ?   Resp 05/30/21 1407 (!) 22  ?   Temp 05/30/21 1407 98.5 ?F (36.9 ?C)  ?   Temp Source 05/30/21 1407 Oral  ?   SpO2 05/30/21 1359 100 %  ?   Weight 05/30/21 1410 145 lb 8.1 oz (66 kg)  ?   Height 05/30/21 1410 '5\' 5"'$  (1.651 m)  ?   Head Circumference --   ?   Peak Flow --   ?    Pain Score 05/30/21 1410 7  ?   Pain Loc --   ?   Pain Edu? --   ?   Excl. in [place]? --   ? ? ?Physical Exam ?Vitals and nursing note reviewed.  ?Constitutional:   ?   General: She is not in acute distress. ?   Appearance: She is well-developed. She is ill-appearing.  ?HENT:  ?   Head: Normocephalic and atraumatic.  ?   Mouth/Throat:  ?   Mouth: Mucous membranes are dry.  ?Eyes:  ?   Conjunctiva/sclera: Conjunctivae normal.  ?Cardiovascular:  ?   Rate and Rhythm: Regular rhythm. Tachycardia present.  ?   Pulses: Normal pulses.  ?   Heart sounds: Normal  heart sounds. No murmur heard. ?Pulmonary:  ?   Effort: Pulmonary effort is normal. No respiratory distress.  ?   Breath sounds: Normal breath sounds.  ?Abdominal:  ?   Palpations: Abdomen is soft.  ?   Tenderness: There is no abdominal tenderness.  ?Musculoskeletal:     ?   General: No swelling.  ?   Cervical back: Normal range of motion and neck supple.  ?Skin: ?   General: Skin is warm and dry.  ?   Capillary Refill: Capillary refill takes less than 2 seconds.  ?Neurological:  ?   General: No focal deficit present.  ?   Mental Status: She is alert.  ?Psychiatric:     ?   Mood and Affect: Mood normal.  ? ? ?ED Results / Procedures / Treatments   ?Labs ?(all labs ordered are listed, but only abnormal results are displayed) ?Labs Reviewed  ?COMPREHENSIVE METABOLIC PANEL - Abnormal; Notable for the following components:  ?    Result Value  ? Sodium 130 (*)   ? Chloride 94 (*)   ? CO2 8 (*)   ? Glucose, Bld 622 (*)   ? BUN 25 (*)   ? Creatinine, Ser 1.50 (*)   ? Total Bilirubin 2.3 (*)   ? GFR, Estimated 42 (*)   ? Anion gap 28 (*)   ? All other components within normal limits  ?CBC WITH DIFFERENTIAL/PLATELET - Abnormal; Notable for the following components:  ? WBC 17.5 (*)   ? Platelets 564 (*)   ? Neutro Abs 16.2 (*)   ? All other components within normal limits  ?BETA-HYDROXYBUTYRIC ACID - Abnormal; Notable for the following components:  ? Beta-Hydroxybutyric Acid 6.83  (*)   ? All other components within normal limits  ?CBG MONITORING, ED - Abnormal; Notable for the following components:  ? Glucose-Capillary 597 (*)   ? All other components within normal limits  ?I-STAT VENOUS

## 2021-05-30 NOTE — ED Notes (Signed)
Patient transported to CT 

## 2021-05-31 ENCOUNTER — Encounter (HOSPITAL_COMMUNITY): Payer: Self-pay | Admitting: Internal Medicine

## 2021-05-31 ENCOUNTER — Ambulatory Visit: Payer: Self-pay | Admitting: Nurse Practitioner

## 2021-05-31 ENCOUNTER — Other Ambulatory Visit: Payer: Self-pay

## 2021-05-31 ENCOUNTER — Ambulatory Visit (HOSPITAL_COMMUNITY): Payer: No Payment, Other

## 2021-05-31 DIAGNOSIS — E1143 Type 2 diabetes mellitus with diabetic autonomic (poly)neuropathy: Secondary | ICD-10-CM

## 2021-05-31 DIAGNOSIS — K3184 Gastroparesis: Secondary | ICD-10-CM

## 2021-05-31 DIAGNOSIS — I5022 Chronic systolic (congestive) heart failure: Secondary | ICD-10-CM

## 2021-05-31 DIAGNOSIS — R6510 Systemic inflammatory response syndrome (SIRS) of non-infectious origin without acute organ dysfunction: Secondary | ICD-10-CM

## 2021-05-31 LAB — BASIC METABOLIC PANEL
Anion gap: 8 (ref 5–15)
Anion gap: 8 (ref 5–15)
BUN: 24 mg/dL — ABNORMAL HIGH (ref 6–20)
BUN: 23 mg/dL — ABNORMAL HIGH (ref 6–20)
CO2: 20 mmol/L — ABNORMAL LOW (ref 22–32)
CO2: 21 mmol/L — ABNORMAL LOW (ref 22–32)
Calcium: 9.3 mg/dL (ref 8.9–10.3)
Calcium: 9.4 mg/dL (ref 8.9–10.3)
Chloride: 106 mmol/L (ref 98–111)
Chloride: 106 mmol/L (ref 98–111)
Creatinine, Ser: 1.05 mg/dL — ABNORMAL HIGH (ref 0.44–1.00)
Creatinine, Ser: 1.1 mg/dL — ABNORMAL HIGH (ref 0.44–1.00)
GFR, Estimated: >60 mL/min (ref >60)
GFR, Estimated: >60 mL/min (ref >60)
Glucose, Bld: 181 mg/dL — ABNORMAL HIGH (ref 70–99)
Glucose, Bld: 162 mg/dL — ABNORMAL HIGH (ref 70–99)
Potassium: 4 mmol/L (ref 3.5–5.1)
Potassium: 4.5 mmol/L (ref 3.5–5.1)
Sodium: 134 mmol/L — ABNORMAL LOW (ref 135–145)
Sodium: 135 mmol/L (ref 135–145)

## 2021-05-31 LAB — GLUCOSE, CAPILLARY
Glucose-Capillary: 153 mg/dL — ABNORMAL HIGH (ref 70–99)
Glucose-Capillary: 256 mg/dL — ABNORMAL HIGH (ref 70–99)

## 2021-05-31 LAB — CBG MONITORING, ED
Glucose-Capillary: 145 mg/dL — ABNORMAL HIGH (ref 70–99)
Glucose-Capillary: 153 mg/dL — ABNORMAL HIGH (ref 70–99)
Glucose-Capillary: 156 mg/dL — ABNORMAL HIGH (ref 70–99)
Glucose-Capillary: 157 mg/dL — ABNORMAL HIGH (ref 70–99)
Glucose-Capillary: 161 mg/dL — ABNORMAL HIGH (ref 70–99)
Glucose-Capillary: 166 mg/dL — ABNORMAL HIGH (ref 70–99)
Glucose-Capillary: 189 mg/dL — ABNORMAL HIGH (ref 70–99)
Glucose-Capillary: 207 mg/dL — ABNORMAL HIGH (ref 70–99)
Glucose-Capillary: 237 mg/dL — ABNORMAL HIGH (ref 70–99)
Glucose-Capillary: 247 mg/dL — ABNORMAL HIGH (ref 70–99)

## 2021-05-31 MED ORDER — INSULIN REGULAR(HUMAN) IN NACL 100-0.9 UT/100ML-% IV SOLN
INTRAVENOUS | Status: DC
  Administered 2021-05-31: 4.2 [IU]/h via INTRAVENOUS

## 2021-05-31 MED ORDER — INSULIN ASPART 100 UNIT/ML IJ SOLN
0.0000 [IU] | Freq: Three times a day (TID) | INTRAMUSCULAR | Status: DC
  Administered 2021-05-31: 2 [IU] via SUBCUTANEOUS

## 2021-05-31 MED ORDER — INSULIN ASPART 100 UNIT/ML IJ SOLN: 4.0000 [IU] | Freq: Three times a day (TID) | INTRAMUSCULAR | Status: DC

## 2021-05-31 MED ORDER — INSULIN GLARGINE-YFGN 100 UNIT/ML ~~LOC~~ SOLN
14.0000 [IU] | Freq: Every day | SUBCUTANEOUS | Status: DC
  Administered 2021-05-31: 14 [IU] via SUBCUTANEOUS
  Filled 2021-05-31: qty 0.14

## 2021-05-31 MED ORDER — INSULIN ASPART 100 UNIT/ML IJ SOLN
0.0000 [IU] | Freq: Three times a day (TID) | INTRAMUSCULAR | Status: DC
  Administered 2021-05-31 (×2): 5 [IU] via SUBCUTANEOUS

## 2021-05-31 MED ORDER — INSULIN PUMP
Freq: Three times a day (TID) | SUBCUTANEOUS | Status: DC
  Filled 2021-05-31: qty 1

## 2021-05-31 MED ORDER — INSULIN PUMP: Freq: Three times a day (TID) | SUBCUTANEOUS | Status: DC

## 2021-05-31 MED ORDER — ENOXAPARIN SODIUM 40 MG/0.4ML IJ SOSY
40.0000 mg | PREFILLED_SYRINGE | INTRAMUSCULAR | Status: DC
  Administered 2021-05-31: 40 mg via SUBCUTANEOUS
  Filled 2021-05-31: qty 0.4

## 2021-05-31 NOTE — Progress Notes (Signed)
?Progress Note ? ? ?Patient: Jennifer Hogan NWG:[id number] DOB: 06-07-1970 DOA: 05/30/2021     1 ?DOS: the patient was seen and examined on 05/31/2021 ?  ?Brief hospital course: ?Mrs. Dileo was admitted to the hospital with the working diagnosis of diabetic ketoacidosis.  ? ?51 yo female with the past medical history of type 2 diabetes mellitis, heart failure, coronary artery disease sp angioplasty, and hypothyroid who presented with nausea and vomiting. Reported 3 days of nausea ans vomiting, associated with abdominal pain and diarrhea. No able to tolerate po intake. On her initial physical examination her blood pressure was 164/94, HR 126, RR 16 and 02 saturation 100%, dry mucous membranes, lungs with no rales or rhonchi, heart with S1 and S2 present and tachycardic, abdomen tender to palpation at the epigastrium with no peritoneal signs, no lower extremity edema.  ? ?Na 131. K 4,8 Cl 95, bicarbonate 7. Glucose 612, bun 26 cr 1,55 anion gap 28.  ?VBG pH 7,23 Pc02 21, bicarbonate 10. ?Wbc 17.5. hgn 13.0 plt 564 ?Sars covid 19 negative ? ?UA sg 1,022, glucose >500, negative protein.  ? ?CT abdomen and pelvis with signs of urinary retention, no acute changes.  ? ?Chest radiograph with no cardiomegaly, no infiltrates, positive sternotomy wires in place.  ? ?EKG 114 bpm, right axis deviation, normal intervals, sinus rhythm with right atrial enlargement, ST depression in lead III and Avf, with no significant T wave changes, poor R wave progression.  ? ?Patient was placed on insulin infusion, anion gap was closed and she was transitioned to SQ insulin with good toleration.  ? ?Assessment and Plan: ?DKA, type 1 ([place]) ?Patient has been successfully transitioned to Sq insulin ?Capillary glucose is 153, 207, 166, 247 ?No nausea or vomiting and tolerating po well.  ? ?Her glucose sensor is malfunctioning but her insulin pump is working well. ? ?Plan to resume insulin pump at home settings. ?She has received long acting  insulin 14 units at  6 am. Will plan to resume pump tomorrow at 6 am.  ? ?AKI (acute kidney injury) ([place]) ?Renal function has improved with a serum cr at 1,10 with k at 4,5 and serum bicarbonate at 21. ? ?Plan to discontinue IV fluids and follow up renal function and electrolytes in am. ?Avoid hypotension and nephrotoxic medications.  ? ?Hypothyroidism ?Continue with levothyroxine  ? ?SIRS (systemic inflammatory response syndrome) (HCC) ?Reactive leukocytosis due to DKA. ?No clinical signs of systemic or bacterial infection. ?Plan to hold on antibiotic therapy for now.  ? ?Gastroparesis due to DM [facility]) ?No further nausea or vomiting ?Patient is tolerating po well ?Continue with as needed antiemetic therapy. ? ?Chronic systolic heart failure ([place]) ?No clinical signs of exacerbation ? ?Echocardiogram from 04/2021 with reduced LV systolic function with EF 30 to 35% with global hypokinesis. Severe dilatation of LV internal cavity, RV systolic function preserved. Normal pulmonary artery systolic pressure. No significant valvular disease.  ? ?Blood pressure has been low 97/59 mmHg/  ? ?Continue close blood pressure monitoring ?Hold on diuretic therapy and discontinue IV fluids. \ ?Resume metoprolol XL when blood pressure more stable.   ? ? ? ? ? ?  ? ?Subjective: Patient with no nausea or vomiting, no chest pain, no dyspnea.  ? ?Physical Exam: ?Vitals:  ? 05/31/21 1000 05/31/21 1200 05/31/21 1202 05/31/21 1232  ?BP: 136/79 110/61  (!) 97/59  ?Pulse: 90 85  78  ?Resp: '18 15  16  '$ ?Temp:   98.3 ?F (36.8 ?C) 97.9 ?F (  36.6 ?C)  ?TempSrc:   Oral Oral  ?SpO2: 100% 100%  98%  ?Weight:    59.6 kg  ?Height:    '5\' 5"'$  (1.651 m)  ? ?Neurology awake and alert ?ENT with no pallor ?Cardiovascular with S1 and S2 present and rhythmic with no gallops, rubs or murmurs ?Respiratory with no rales or wheezing ?Abdomen not distended ?No lower extremity edema  ?Data Reviewed: ? ? ? ?Family Communication: no family at the bedside   ? ?Disposition: ?Status is: Inpatient ?Remains inpatient appropriate because: recovering DKA  ? Planned Discharge Destination: Home ? ? ? ? ?Author: ?Tawni Millers, MD ?05/31/2021 3:23 PM ? ?For on call review [email or web address].  ?

## 2021-05-31 NOTE — Progress Notes (Addendum)
Inpatient Diabetes Program Recommendations ? ?AACE/ADA: New Consensus Statement on Inpatient Glycemic Control (2015) ? ?Target Ranges:  Prepandial:   less than 140 mg/dL ?     Peak postprandial:   less than 180 mg/dL (1-2 hours) ?     Critically ill patients:  140 - 180 mg/dL  ? ? Latest Reference Range & Units 05/30/21 14:13 05/30/21 17:11 05/30/21 18:17 05/30/21 19:03 05/30/21 19:53 05/30/21 20:17 05/30/21 21:00 05/30/21 22:23 05/30/21 23:48  ?Glucose-Capillary 70 - 99 mg/dL 597 (HH) >600 (HH) ? ?IV Insulin Drip Started 524 (HH) 435 (H) 381 (H) 326 (H) 276 (H) 205 (H) 221 (H)  ? ? Latest Reference Range & Units 05/31/21 00:53 05/31/21 01:53 05/31/21 02:55 05/31/21 03:54 05/31/21 05:04 05/31/21 05:50 05/31/21 06:40 05/31/21 07:37 05/31/21 09:42  ?Glucose-Capillary 70 - 99 mg/dL 237 (H) 189 (H) 156 (H) 145 (H) ? ?IV Insulin Drip Stopped 161 (H) 157 (H) ? ?14 units Semglee '@0554'$  153 (H) 207 (H) ? ?5 units Novolog ? 166 (H)  ? ? ?Admit with: DKA (possible gastroenteritis) ? ?History: Type 1 diabetes (Makes No Insulin--Needs Basal, Correction, Meal Coverage) ? ?Home DM Meds: Insulin Pump ([employer]) ? ?Current Orders: Semglee 14 units Daily ?    Novolog Moderate Correction Scale/ SSI (0-15 units) TID AC + HS ?    Novolog 4 units TID with meals ? ? ?MD- Note pt transitioned to SQ Insulin this AM given improvement on 5am BMET. ? ?Semglee 14 units Daily started at 6am ? ?Please consider reducing the Novolog SSI to the Sensitive (0-9 unit) scale  ? ?When/If pt allowed to resume home Insulin Pump, will need to do so with all new pump supplies (insulin, reservoir, insertion site, etc) since she was admitted with DKA ?When/If you allow pt to resume home Insulin Pump, tomorrow (05/10) AM (6-8am) would be ideal since basal insulin given this AM will be out of her system ? ? ?Endocrinologist: Dr. Loanne Drilling with [facility] ?Last seen via Video Visit 12/13/2020 ?No Changes made to pt's Insulin Pump settings at that visit: ?Basal rates   ?6am- 10am- 0.85 units/hr ?10am- 10pm- 0.975 units/hr ?10pm- 12am- 0.75 units/hr ?12am- 6am- 0.625 units/hr ?Total Basal per 24 hour period= 20.35 units  ?Carbohydrate Ratio= 1 unit/16 grams carbohydrate ?Correction Ratio= 1 unit for each 100 by which your glucose exceeds 100 ?Target CBG= 100 mg/dl ? ? ? ? ?--Will follow patient during hospitalization-- ? ?Wyn Quaker RN, MSN, CDE ?Diabetes Coordinator ?Inpatient Glycemic Control Team ?Team Pager: [phone] (8a-5p) ? ?

## 2021-05-31 NOTE — Hospital Course (Signed)
Jennifer Hogan was admitted to the hospital with the working diagnosis of diabetic ketoacidosis.  ? ?51 yo female with the past medical history of type 2 diabetes mellitis, heart failure, coronary artery disease sp angioplasty, and hypothyroid who presented with nausea and vomiting. Reported 3 days of nausea ans vomiting, associated with abdominal pain and diarrhea. No able to tolerate po intake. On her initial physical examination her blood pressure was 164/94, HR 126, RR 16 and 02 saturation 100%, dry mucous membranes, lungs with no rales or rhonchi, heart with S1 and S2 present and tachycardic, abdomen tender to palpation at the epigastrium with no peritoneal signs, no lower extremity edema.  ? ?Na 131. K 4,8 Cl 95, bicarbonate 7. Glucose 612, bun 26 cr 1,55 anion gap 28.  ?VBG pH 7,23 Pc02 21, bicarbonate 10. ?Wbc 17.5. hgn 13.0 plt 564 ?Sars covid 19 negative ? ?UA sg 1,022, glucose >500, negative protein.  ? ?CT abdomen and pelvis with signs of urinary retention, no acute changes.  ? ?Chest radiograph with no cardiomegaly, no infiltrates, positive sternotomy wires in place.  ? ?EKG 114 bpm, right axis deviation, normal intervals, sinus rhythm with right atrial enlargement, ST depression in lead III and Avf, with no significant T wave changes, poor R wave progression.  ? ?Patient was placed on insulin infusion, anion gap was closed and she was transitioned to SQ insulin with good toleration.  ?

## 2021-05-31 NOTE — ED Notes (Addendum)
Dr Cyd Silence notified that insulin drip was stopped at 0615. ?

## 2021-05-31 NOTE — Assessment & Plan Note (Signed)
Continue with levothyroxine  

## 2021-05-31 NOTE — ED Notes (Signed)
Sandwich given, she ate only 2 bites. ?

## 2021-05-31 NOTE — Assessment & Plan Note (Addendum)
Reactive leukocytosis due to DKA. ?No clinical signs of systemic or bacterial infection. ? ?

## 2021-05-31 NOTE — Assessment & Plan Note (Addendum)
Renal function has improved after hydration.  ?

## 2021-05-31 NOTE — Plan of Care (Signed)
  Problem: Clinical Measurements: Goal: Respiratory complications will improve Outcome: Progressing   Problem: Safety: Goal: Ability to remain free from injury will improve Outcome: Progressing   

## 2021-05-31 NOTE — ED Notes (Signed)
Breakfast order placed ?

## 2021-05-31 NOTE — Progress Notes (Signed)
HOSPITAL MEDICINE OVERNIGHT EVENT NOTE   ? ?I have been notified by nursing that patient's anion gap is closed on the latest chemistry.  Chemistry personally reviewed revealing a closed anion gap. ? ?We will transition patient to a regimen of basal bolus insulin for this 150 pound patient with calculated total daily insulin [name] loosely based off of 0.4 units/kg.  ? ?We will start with 14 units of Semglee every morning (first dose now) and 4 units of NovoLog before every meal with additional sliding scale insulin before every meal and nightly. ? ?Patient will also be placed on a diabetic diet simultaneously and insulin infusion will be discontinued in approximately 1 hour. ? ?Vernelle Emerald  MD ?[facility]  ? ? ? ? ? ? ? ? ? ? ?

## 2021-05-31 NOTE — Assessment & Plan Note (Addendum)
No clinical signs of exacerbation ? ?Echocardiogram from 04/2021 with reduced LV systolic function with EF 30 to 35% with global hypokinesis. Severe dilatation of LV internal cavity, RV systolic function preserved. Normal pulmonary artery systolic pressure. No significant valvular disease.  ? ?

## 2021-05-31 NOTE — Assessment & Plan Note (Addendum)
DKA resolved.  Patient was transitioned to subcutaneous insulin.  Subsequently transitioned back to her insulin pump.   ?

## 2021-05-31 NOTE — Assessment & Plan Note (Addendum)
No further nausea or vomiting ?Patient is tolerating po well ? ?

## 2021-06-01 DIAGNOSIS — E1059 Type 1 diabetes mellitus with other circulatory complications: Secondary | ICD-10-CM

## 2021-06-01 LAB — BASIC METABOLIC PANEL
Anion gap: 6 (ref 5–15)
BUN: 12 mg/dL (ref 6–20)
CO2: 23 mmol/L (ref 22–32)
Calcium: 8.9 mg/dL (ref 8.9–10.3)
Chloride: 108 mmol/L (ref 98–111)
Creatinine, Ser: 0.86 mg/dL (ref 0.44–1.00)
GFR, Estimated: >60 mL/min (ref >60)
Glucose, Bld: 149 mg/dL — ABNORMAL HIGH (ref 70–99)
Potassium: 3.5 mmol/L (ref 3.5–5.1)
Sodium: 137 mmol/L (ref 135–145)

## 2021-06-01 LAB — GLUCOSE, CAPILLARY
Glucose-Capillary: 137 mg/dL — ABNORMAL HIGH (ref 70–99)
Glucose-Capillary: 149 mg/dL — ABNORMAL HIGH (ref 70–99)

## 2021-06-01 MED ORDER — POTASSIUM CHLORIDE CRYS ER 20 MEQ PO TBCR
40.0000 meq | EXTENDED_RELEASE_TABLET | Freq: Once | ORAL | Status: AC
Start: 2021-06-01 — End: 2021-06-01
  Administered 2021-06-01: 40 meq via ORAL
  Filled 2021-06-01: qty 2

## 2021-06-01 NOTE — Progress Notes (Signed)
Discharge instructions reviewed with pt.  ?Copy of instructions given to pt.   No new medications.  ?

## 2021-06-01 NOTE — TOC Initial Note (Signed)
Transition of Care (TOC) - Initial/Assessment Note  ? ? ?Patient Details  ?Name: Jennifer Hogan ?MRN: [id number] ?Date of Birth: [birth year] ? ?Transition of Care (TOC) CM/SW Contact:    ?Zenon Mayo, RN ?Phone Number: ?06/01/2021, 10:07 AM ? ?Clinical Narrative:                 ?Patient is for dc today, her transport is on the way.  She has no needs. ? ?Expected Discharge Plan: Home/Self Care ?Barriers to Discharge: No Barriers Identified ? ? ?Patient Goals and CMS Choice ?Patient states their goals for this hospitalization and ongoing recovery are:: return home ?  ?  ? ?Expected Discharge Plan and Services ?Expected Discharge Plan: Home/Self Care ?  ?Discharge Planning Services: CM Consult ?  ?Living arrangements for the past 2 months: [place] ?Expected Discharge Date: 06/01/21               ?  ?DME Agency: NA ?  ?  ?  ?HH Arranged: NA ?  ?  ?  ?  ? ?Prior Living Arrangements/Services ?Living arrangements for the past 2 months: [place] ?Lives with:: Self ?Patient language and need for interpreter reviewed:: Yes ?Do you feel safe going back to the place where you live?: Yes      ?Need for Family Participation in Patient Care: Yes (Comment) ?Care giver support system in place?: Yes (comment) ?  ?Criminal Activity/Legal Involvement Pertinent to Current Situation/Hospitalization: No - Comment as needed ? ?Activities of Daily Living ?Home Assistive Devices/Equipment: CBG Meter (insulin pump) ?ADL Screening (condition at time of admission) ?Patient's cognitive ability adequate to safely complete daily activities?: Yes ?Is the patient deaf or have difficulty hearing?: No ?Does the patient have difficulty seeing, even when wearing glasses/contacts?: No ?Does the patient have difficulty concentrating, remembering, or making decisions?: No ?Patient able to express need for assistance with ADLs?: Yes ?Does the patient have difficulty dressing or bathing?: No ?Independently performs ADLs?:  Yes (appropriate for developmental age) ?Does the patient have difficulty walking or climbing stairs?: No ?Weakness of Legs: None ?Weakness of Arms/Hands: None ? ?Permission Sought/Granted ?  ?  ?   ?   ?   ?   ? ?Emotional Assessment ?Appearance:: Appears stated age ?Attitude/Demeanor/Rapport: Engaged ?Affect (typically observed): Appropriate ?Orientation: : Oriented to Self, Oriented to Place, Oriented to  Time, Oriented to Situation ?Alcohol / Substance Use: Not Applicable ?Psych Involvement: No (comment) ? ?Admission diagnosis:  DKA (diabetic ketoacidosis) ([facility]) [E11.10] ?Diabetic ketoacidosis without coma associated with type 1 diabetes mellitus ([facility]) [E10.10] ?Patient Active Problem List  ? Diagnosis Date Noted  ? Community acquired pneumonia   ? Heart failure with reduced ejection fraction ([facility]) 05/11/2021  ? Acute respiratory failure with hypoxia ([place]) 05/11/2021  ? DCM (dilated cardiomyopathy) ([facility])   ? Takotsubo cardiomyopathy   ? AKI (acute kidney injury) ([facility]) 05/01/2019  ? Sinus tachycardia 11/24/2018  ? Chronic systolic heart failure ([facility])   ? Cardiomyopathy ([place])   ? DKA, type 1 ([place]) 05/24/2018  ? Moderate episode of recurrent major depressive disorder ([facility]) 03/27/2018  ? Hyperglycemia due to type 1 diabetes mellitus ([facility]) 02/14/2018  ? Hyperlipidemia LDL goal <70 02/14/2018  ? S/P drug eluting coronary stent placement   ? Non-ST elevation (NSTEMI) myocardial infarction ([facility]) 02/08/2018  ? Marijuana abuse 12/31/2017  ? Gastroparesis 12/06/2017  ? Gastroparesis due to DM ([facility]) 03/28/2017  ? CAD S/P percutaneous coronary angioplasty 03/28/2017  ? Ischemic cardiomyopathy 03/28/2017  ? Pure  hypercholesterolemia   ? NSTEMI (non-ST elevated myocardial infarction) ([facility])   ? Anemia 03/17/2017  ? SIRS (systemic inflammatory response syndrome) ([facility]) 03/15/2017  ? Allergic state 08/07/2015  ? Vitiligo 07/09/2012  ? Type 1 diabetes mellitus ([facility]) 07/09/2012  ? Depression 12/04/2010  ? Non-intractable vomiting  with nausea 12/04/2010  ? Hypothyroidism 12/04/2010  ? ?PCP:  [name], NP ?Pharmacy:   ?[facility] [address], [address] ?[place] [address] ?Phone: [phone] Fax: [phone] ? ?[facility] [place] ?[place] ?[place] [address] ?Phone: [phone] Fax: [phone] ? ? ? ? ?Social Determinants of Health (SDOH) Interventions ?  ? ?Readmission Risk Interventions ? ?  06/01/2021  ? 10:05 AM 12/02/2018  ? 10:15 AM 11/26/2018  ? 11:35 AM  ?Readmission Risk Prevention Plan  ?Transportation Screening Complete Complete Complete  ?PCP or Specialist Appt within 3-5 Days  Complete Complete  ?[place] or Home Care Consult Complete Complete Complete  ?Social Work Consult for [place] Planning/Counseling Complete Complete Complete  ?Palliative Care Screening Not Applicable Complete Complete  ?Medication Review Press photographer) Complete Complete Complete  ? ? ? ?

## 2021-06-01 NOTE — TOC Transition Note (Signed)
Transition of Care (TOC) - CM/SW Discharge Note ? ? ?Patient Details  ?Name: Jennifer Hogan ?MRN: [id number] ?Date of Birth: 1971-01-01 ? ?Transition of Care (TOC) CM/SW Contact:  ?Zenon Mayo, RN ?Phone Number: ?06/01/2021, 10:08 AM ? ? ?Clinical Narrative:    ?Patient is for dc today, her transport is on the way.  She has no needs. ? ? ?Final next level of care: Home/Self Care ?Barriers to Discharge: No Barriers Identified ? ? ?Patient Goals and CMS Choice ?Patient states their goals for this hospitalization and ongoing recovery are:: return home ?  ?  ? ?Discharge Placement ?  ?           ?  ?  ?  ?  ? ?Discharge Plan and Services ?  ?Discharge Planning Services: CM Consult ?           ?  ?DME Agency: NA ?  ?  ?  ?HH Arranged: NA ?  ?  ?  ?  ? ?Social Determinants of Health (SDOH) Interventions ?  ? ? ?Readmission Risk Interventions ? ?  06/01/2021  ? 10:05 AM 12/02/2018  ? 10:15 AM 11/26/2018  ? 11:35 AM  ?Readmission Risk Prevention Plan  ?Transportation Screening Complete Complete Complete  ?PCP or Specialist Appt within 3-5 Days  Complete Complete  ?[place] or Home Care Consult Complete Complete Complete  ?Social Work Consult for [place] Planning/Counseling Complete Complete Complete  ?Palliative Care Screening Not Applicable Complete Complete  ?Medication Review Press photographer) Complete Complete Complete  ? ? ? ? ? ?

## 2021-06-01 NOTE — Discharge Summary (Signed)
?[facility] ? ?Physician Discharge Summary  ? ?Patient ID: ?Jennifer Hogan ?MRN: [id number] ?DOB/AGE: [birth year] 51 y.o. ? ?Admit date: 05/30/2021 ?Discharge date: 06/01/2021   ? ?PCP: [name], NP ? ?DISCHARGE DIAGNOSES:  ?Active Problems: ?  DKA, type 1 ([facility]) ?  AKI (acute kidney injury) ([facility]) ?  Hypothyroidism ?  SIRS (systemic inflammatory response syndrome) ([facility]) ?  Gastroparesis due to DM [facility]) ?  Chronic systolic heart failure ([facility]) ? ? ?RECOMMENDATIONS FOR OUTPATIENT FOLLOW UP: ?Outpatient follow-up with PCP and endocrinology ? ? ?Home Health: None ?Equipment/Devices: None ? ?CODE STATUS: Full code ? ?DISCHARGE CONDITION: fair ? ?Diet recommendation: Modified carbohydrate ? ?INITIAL HISTORY: ?Jennifer Hogan was admitted to the hospital with the working diagnosis of diabetic ketoacidosis.  ? ?51 yo female with the past medical history of type 2 diabetes mellitis, heart failure, coronary artery disease sp angioplasty, and hypothyroid who presented with nausea and vomiting. Reported 3 days of nausea ans vomiting, associated with abdominal pain and diarrhea. No able to tolerate po intake. On her initial physical examination her blood pressure was 164/94, HR 126, RR 16 and 02 saturation 100%, dry mucous membranes, lungs with no rales or rhonchi, heart with S1 and S2 present and tachycardic, abdomen tender to palpation at the epigastrium with no peritoneal signs, no lower extremity edema.  ? ?Na 131. K 4,8 Cl 95, bicarbonate 7. Glucose 612, bun 26 cr 1,55 anion gap 28.  ?VBG pH 7,23 Pc02 21, bicarbonate 10. ?Wbc 17.5. hgn 13.0 plt 564 ?Sars covid 19 negative ? ?UA sg 1,022, glucose >500, negative protein.  ? ?CT abdomen and pelvis with signs of urinary retention, no acute changes.  ? ?Chest radiograph with no cardiomegaly, no infiltrates, positive sternotomy wires in place.  ? ?EKG 114 bpm, right axis deviation, normal intervals, sinus rhythm with right atrial enlargement, ST depression in lead  III and Avf, with no significant T wave changes, poor R wave progression.  ? ?Patient was placed on insulin infusion, anion gap was closed and she was transitioned to SQ insulin with good toleration.  ? ? ?HOSPITAL COURSE:  ? ? ?DKA, type 1 ([place]) ?DKA resolved.  Patient was transitioned to subcutaneous insulin.  Subsequently transitioned back to her insulin pump.   ? ?AKI (acute kidney injury) ([place]) ?Renal function has improved after hydration.  She was noted to have urinary retention at the time of admission.  Underwent in and out catheterization with resolution.  Has been able to void on her own. ? ?Hypothyroidism ?Continue with levothyroxine  ? ?SIRS (systemic inflammatory response syndrome) (H[facility]) ?Reactive leukocytosis due to DKA. ?No clinical signs of systemic or bacterial infection. ? ? ?Gastroparesis due to DM [facility]) ?No further nausea or vomiting ?Patient is tolerating po well ? ? ?Chronic systolic heart failure ([place]) ?No clinical signs of exacerbation ? ?Echocardiogram from 04/2021 with reduced LV systolic function with EF 30 to 35% with global hypokinesis. Severe dilatation of LV internal cavity, RV systolic function preserved. Normal pulmonary artery systolic pressure. No significant valvular disease.  ? ? ? ?Patient is stable.  Okay for discharge home today. ? ?PERTINENT LABS: ? ?The results of significant diagnostics from this hospitalization (including imaging, microbiology, ancillary and laboratory) are listed below for reference.   ? ?Microbiology: ?Recent Results (from the past 240 hour(s))  ?Resp Panel by RT-PCR (Flu A&B, Covid) Nasopharyngeal Swab     Status: None  ? Collection Time: 05/30/21  2:12 PM  ? Specimen: Nasopharyngeal Swab; Nasopharyngeal(NP) swabs in  vial transport medium  ?Result Value Ref Range Status  ? SARS Coronavirus 2 by RT PCR NEGATIVE NEGATIVE Final  ?  Comment: (NOTE) ?SARS-CoV-2 target nucleic acids are NOT DETECTED. ? ?The SARS-CoV-2 RNA is generally detectable in upper  respiratory ?specimens during the acute phase of infection. The lowest ?concentration of SARS-CoV-2 viral copies this assay can detect is ?138 copies/mL. A negative result does not preclude SARS-Cov-2 ?infection and should not be used as the sole basis for treatment or ?other patient management decisions. A negative result may occur with  ?improper specimen collection/handling, submission of specimen other ?than nasopharyngeal swab, presence of viral mutation(s) within the ?areas targeted by this assay, and inadequate number of viral ?copies(<138 copies/mL). A negative result must be combined with ?clinical observations, patient history, and epidemiological ?information. The expected result is Negative. ? ?Fact Sheet for Patients:  ?EntrepreneurPulse.com.au ? ?Fact Sheet for Healthcare Providers:  ?IncredibleEmployment.be ? ?This test is no t yet approved or cleared by the [place] FDA and  ?has been authorized for detection and/or diagnosis of SARS-CoV-2 by ?FDA under an Emergency Use Authorization (EUA). This EUA will remain  ?in effect (meaning this test can be used) for the duration of the ?COVID-19 declaration under Section 564(b)(1) of the Act, 21 ?U.S.C.section 360bbb-3(b)(1), unless the authorization is terminated  ?or revoked sooner.  ? ? ?  ? Influenza A by PCR NEGATIVE NEGATIVE Final  ? Influenza B by PCR NEGATIVE NEGATIVE Final  ?  Comment: (NOTE) ?The Xpert Xpress SARS-CoV-2/FLU/RSV plus assay is intended as an aid ?in the diagnosis of influenza from Nasopharyngeal swab specimens and ?should not be used as a sole basis for treatment. Nasal washings and ?aspirates are unacceptable for Xpert Xpress SARS-CoV-2/FLU/RSV ?testing. ? ?Fact Sheet for Patients: ?EntrepreneurPulse.com.au ? ?Fact Sheet for Healthcare Providers: ?IncredibleEmployment.be ? ?This test is not yet approved or cleared by the [place] FDA and ?has been  authorized for detection and/or diagnosis of SARS-CoV-2 by ?FDA under an Emergency Use Authorization (EUA). This EUA will remain ?in effect (meaning this test can be used) for the duration of the ?COVID-19 declaration under Section 564(b)(1) of the Act, 21 U.S.C. ?section 360bbb-3(b)(1), unless the authorization is terminated or ?revoked. ? ?Performed at [facility], [place] [address], [place], [place] ?[address] ?  ?  ? ?Labs: ? ?COVID-19 Labs ? ?CBC: ?Recent Labs  ?Lab 05/30/21 ?1408 05/30/21 ?1429  ?WBC 17.5*  --   ?NEUTROABS 16.2*  --   ?HGB 13.0 14.6  ?HCT 40.5 43.0  ?MCV 93.5  --   ?PLT 564*  --   ? ? ? ?CBG: ?Recent Labs  ?Lab 05/31/21 ?1138 05/31/21 ?1621 05/31/21 ?2105 06/01/21 ?0028 06/01/21 ?4332  ?GLUCAP 247* 153* 256* 149* 137*  ? ? ? ?IMAGING STUDIES ?CT ABDOMEN PELVIS WO CONTRAST ? ?Result Date: 05/30/2021 ?CLINICAL DATA:  Abdominal pain, acute, nonlocalized EXAM: CT ABDOMEN AND PELVIS WITHOUT CONTRAST TECHNIQUE: Multidetector CT imaging of the abdomen and pelvis was performed following the standard protocol without IV contrast. RADIATION DOSE REDUCTION: This exam was performed according to the departmental dose-optimization program which includes automated exposure control, adjustment of the mA and/or kV according to patient size and/or use of iterative reconstruction technique. COMPARISON:  09/30/2020 FINDINGS: Lower chest: No acute abnormality. Hepatobiliary: Unremarkable unenhanced appearance of the liver. No focal liver lesion identified. Gallbladder within normal limits. No hyperdense gallstone. No biliary dilatation. Pancreas: Unremarkable. No pancreatic ductal dilatation or surrounding inflammatory changes. Spleen: Normal in size without focal abnormality.  Adrenals/Urinary Tract: No adrenal hemorrhage or renal injury identified. Urinary bladder is distended to the level of the umbilicus. Stomach/Bowel: Stomach is within normal limits. Appendix not visualized and may be surgically absent. No  evidence of bowel wall thickening, distention, or inflammatory changes. Vascular/Lymphatic: Scattered aortoiliac atherosclerotic calcifications without aneurysm. No abdominopelvic lymphadenopathy. Reproductive: U

## 2021-06-01 NOTE — Progress Notes (Signed)
Pt d/c'd via wheelchair with belongings, escorted by unit staff. Family at main entrance. ?

## 2021-06-02 ENCOUNTER — Telehealth: Payer: Self-pay

## 2021-06-02 NOTE — Telephone Encounter (Signed)
Transition Care Management Follow-up Telephone Call ?Date of discharge and from where: 06/01/2021 , [facility]  ?How have you been since you were released from the hospital? She said she just woke up from sleeping for 12 hours so she feels " glorious." She said she has not had time to dwell on her medical issues.  ?Any questions or concerns? Yes - she is concerned about paying her medical bills on top of household expenses.  She said that her friends paid for her medications. She had been paying $200/month out of pocket for supplies for her insulin pump. She is not able to afford that and plans to call [facility] to inquire about assistance that might be available for her.  ?She is not sure if she has [employer]/ [employer] and plans to call [facility] to find out. I informed her that she must call the hospital because we currently don't have a financial counselor on sight at our clinic.  ?She said she stopped smoking and has also stopped cannabis and CBD.  ?She thinks she needs O2.  She explained that when she is cooking, she gets short of breath. I explained to her that O2 is not just ordered, she needs to qualify.  That involves coming to the clinic for a walk test.  She said she will call the clinic if she feels she needs to be seen prior to 06/27/2021. She also said that she understands to go to ED if she is short of breath and in need of medical attention.  ? ?Items Reviewed: ?Did the pt receive and understand the discharge instructions provided? Yes  ?Medications obtained and verified? Yes -she said she has all of her medications and did not have any questions about her med regime She explained that th CGM has malfunctioned so she is checking blood sugars with fingersticks 8 times/day.  ?Other? No  ?Any new allergies since your discharge? No  ?Dietary orders reviewed? No ?Do you have support at home? Yes  - has support from some friends.  ? ?Home Care and  Equipment/Supplies: ?Were home health services ordered? no ?If so, what is the name of the agency? N/a  ?Has the agency set up a time to come to the patient's home? not applicable ?Were any new equipment or medical supplies ordered?  No ?What is the name of the medical supply agency? N/a ?Were you able to get the supplies/equipment? not applicable ?Do you have any questions related to the use of the equipment or supplies? No ? ?Functional Questionnaire: (I = Independent and D = Dependent) ?ADLs: independent ? ? ? ?Follow up appointments reviewed: ? ?PCP Hospital f/u appt confirmed? Yes  Scheduled to see [name], NP - 06/27/2021. ?[facility] f/u appt confirmed? Yes  Scheduled to see GI- 06/03/2021.  ?Are transportation arrangements needed? No - she is relying on friends now to assist.  ?If their condition worsens, is the pt aware to call PCP or go to the Emergency Dept.? Yes ?Was the patient provided with contact information for the PCP's office or ED? Yes ?Was to pt encouraged to call back with questions or concerns? Yes ? ?

## 2021-06-03 ENCOUNTER — Ambulatory Visit: Payer: Medicaid Other | Admitting: Nurse Practitioner

## 2021-06-03 ENCOUNTER — Other Ambulatory Visit: Payer: Self-pay

## 2021-06-08 ENCOUNTER — Telehealth (HOSPITAL_COMMUNITY): Payer: Self-pay | Admitting: Nurse Practitioner

## 2021-06-08 NOTE — BH Assessment (Signed)
Care Management - [facility] Follow Up Discharges  ° °Writer attempted to make contact with patient today and was unsuccessful.  Writer left a HIPPA compliant voice message.  ° °Per chart review, patient was provided with outpatient resources. ° °

## 2021-06-23 ENCOUNTER — Other Ambulatory Visit: Payer: Self-pay | Admitting: Family Medicine

## 2021-06-23 DIAGNOSIS — K3184 Gastroparesis: Secondary | ICD-10-CM

## 2021-06-24 ENCOUNTER — Other Ambulatory Visit: Payer: Self-pay

## 2021-06-24 MED ORDER — METOCLOPRAMIDE HCL 5 MG PO TABS
5.0000 mg | ORAL_TABLET | Freq: Two times a day (BID) | ORAL | 0 refills | Status: DC | PRN
  Filled 2021-06-24: qty 60, 30d supply, fill #0

## 2021-06-24 NOTE — Telephone Encounter (Signed)
Requested medication (s) are due for refill today: yes  Requested medication (s) are on the active medication list: yes  Last refill:  01/13/21-01/13/22 #60 0 refills  Future visit scheduled: yes in 3 days  Notes to clinic:  not delegated per protocol. Do you want to refill Rx?     Requested Prescriptions  Pending Prescriptions Disp Refills   metoCLOPramide (REGLAN) 5 MG tablet 60 tablet 0    Sig: Take 1 tablet (5 mg total) by mouth every 12 (twelve) hours as needed for nausea or vomiting.     Not Delegated - Gastroenterology: Antiemetics - metoclopramide Failed - 06/23/2021 10:02 PM      Failed - This refill cannot be delegated      Passed - Cr in normal range and within 360 days    Creatinine, Ser  Date Value Ref Range Status  06/01/2021 0.86 0.44 - 1.00 mg/dL Final   Creatinine,U  Date Value Ref Range Status  11/23/2015 138.4 mg/dL Final         Passed - Valid encounter within last 6 months    Recent Outpatient Visits           3 months ago Type 1 diabetes mellitus with other circulatory complication [facility]   [place], Vernia Buff, NP   7 months ago Flu-like symptoms   [place], Vernia Buff, NP   11 months ago Chronic fatigue   [place], Vernia Buff, NP   2 years ago Cough in adult patient   [place], Vernia Buff, NP   2 years ago Anxiety and depression   [place], Vernia Buff, NP       Future Appointments             In 3 days [name], NP [facility]

## 2021-06-27 ENCOUNTER — Ambulatory Visit: Payer: Medicaid Other | Attending: Nurse Practitioner | Admitting: Nurse Practitioner

## 2021-06-27 ENCOUNTER — Other Ambulatory Visit: Payer: Self-pay

## 2021-06-27 ENCOUNTER — Encounter: Payer: Self-pay | Admitting: Nurse Practitioner

## 2021-06-27 VITALS — BP 112/73 | HR 64 | Temp 98.3°F | Resp 16 | Ht 65.0 in | Wt 145.0 lb

## 2021-06-27 DIAGNOSIS — E1059 Type 1 diabetes mellitus with other circulatory complications: Secondary | ICD-10-CM

## 2021-06-27 DIAGNOSIS — K3184 Gastroparesis: Secondary | ICD-10-CM

## 2021-06-27 DIAGNOSIS — Z09 Encounter for follow-up examination after completed treatment for conditions other than malignant neoplasm: Secondary | ICD-10-CM

## 2021-06-27 LAB — GLUCOSE, POCT (MANUAL RESULT ENTRY): POC Glucose: 130 mg/dl — AB (ref 70–99)

## 2021-06-27 MED ORDER — METOCLOPRAMIDE HCL 5 MG PO TABS
5.0000 mg | ORAL_TABLET | Freq: Two times a day (BID) | ORAL | 3 refills | Status: DC | PRN
  Filled 2021-06-27: qty 60, 30d supply, fill #0
  Filled 2021-12-03: qty 60, 30d supply, fill #1

## 2021-06-27 NOTE — Progress Notes (Signed)
Assessment & Plan:  Jennifer Hogan was seen today for hospitalization follow-up.  Diagnoses and all orders for this visit:  Hospital discharge follow-up  Gastroparesis -     metoCLOPramide (REGLAN) 5 MG tablet; Take 1 tablet (5 mg total) by mouth every 12 (twelve) hours as needed for nausea or vomiting. -     Cancel: Glucose (CBG)  Type 1 diabetes mellitus with other circulatory complication (HCC) -     Glucose (CBG)    Patient has been counseled on age-appropriate routine health concerns for screening and prevention. These are reviewed and up-to-date. Referrals have been placed accordingly. Immunizations are up-to-date or declined.    Subjective:   Chief Complaint  Patient presents with   Hospitalization Follow-up   HPI Jennifer Hogan 51 y.o. female presents to office today for HFU.  She sees endocrinology for type1 DM and hypothyroidism  She has past medical history of type 1 diabetes mellitis, anxiety and depression, GERD, ICM, chronic systolic heart failure with reduced ejection fraction, coronary artery disease sp angioplasty, NSTEMI, CABG x3, marijuana abuse, and hypothyroidism     HFU She had a 2-day hospital admission on 05/30/2021 due to diabetic ketoacidosis.  CT of abdomen and pelvis and chest radiograph were negative.  She was treated with insulin drip, transitioned back  to her insulin pump and discharged on Jun 01, 2021.  Hospital course was complicated by acute kidney injury with renal function improved with hydration upon discharge  Today she states she is feeling well physically however mentally she is experiencing moderate to severe depression.  States she has not been able to hold any full-time employment due to her mood disorder.  Stressed about finances. She is aware and has received resources for OP behavioral health. Does not endorse any suicidal ideation at this time. Currently taking Prozac 40 mg daily and hydroxyzine 25 mg TID prn (however this has been  prescribed for itching).    Review of Systems  Constitutional:  Negative for fever, malaise/fatigue and weight loss.  HENT: Negative.  Negative for nosebleeds.   Eyes: Negative.  Negative for blurred vision, double vision and photophobia.  Respiratory: Negative.  Negative for cough and shortness of breath.   Cardiovascular: Negative.  Negative for chest pain, palpitations and leg swelling.  Gastrointestinal:  Negative for heartburn, nausea and vomiting.       Gastroparesis for which she takes reglan  Musculoskeletal: Negative.  Negative for myalgias.  Neurological: Negative.  Negative for dizziness, focal weakness, seizures and headaches.  Psychiatric/Behavioral:  Positive for depression. Negative for suicidal ideas. The patient is nervous/anxious.     Past Medical History:  Diagnosis Date   Anemia    Anxiety    Coronary artery disease    a. s/p PTCA DES x3 in the LAD (ostial DES placed, mid DES placed, distal DES placed) and PTCA/DES x1 to intermediate branch 02/2017. b. ACS 02/08/18 with LCx stent placement.   Depression    Diabetes mellitus    type 1, patient is not using her insulin pump.   DKA (diabetic ketoacidoses) 12/31/2017   Dyspnea    with exertion and dust, no oxygen   Elevated platelet count    GERD (gastroesophageal reflux disease)    High cholesterol    Hypothyroidism    Ischemic cardiomyopathy    a. EF low-normal with basal inferior akinesis, basal septal hypokinesis by echo 01/2018.   Myocardial infarction [facility]) 2019    Past Surgical History:  Procedure Laterality Date   APPENDECTOMY  CORONARY ARTERY BYPASS GRAFT N/A 11/04/2018   Procedure: CORONARY ARTERY BYPASS GRAFTING (CABG), ON PUMP, TIMES THREE, USING LEFT AND RIGHT INTERNAL MAMMARY ARTERIES;  Surgeon: Wonda Olds, MD;  Location: [place];  Service: Open Heart Surgery;  Laterality: N/A;   CORONARY STENT INTERVENTION N/A 03/20/2017   Procedure: DES x 3 LAD, DES OM; Surgeon: Burnell Blanks,  MD;  Location: [place] CV LAB;  Service: Cardiovascular;  Laterality: N/A;   CORONARY/GRAFT ACUTE MI REVASCULARIZATION N/A 02/08/2018   Procedure: Coronary/Graft Acute MI Revascularization;  Surgeon: Belva Crome, MD;  Location: [place] CV LAB;  Service: Cardiovascular;  Laterality: N/A;   GAS INSERTION Right 04/28/2021   Procedure: INSERTION OF GAS ( C3F8);  Surgeon: Jalene Mullet, MD;  Location: [place];  Service: Ophthalmology;  Laterality: Right;  Right eye   GAS/FLUID EXCHANGE Right 04/28/2021   Procedure: GAS/FLUID EXCHANGE;  Surgeon: Jalene Mullet, MD;  Location: [place];  Service: Ophthalmology;  Laterality: Right;  Right eye   LEFT HEART CATH AND CORONARY ANGIOGRAPHY N/A 03/20/2017   Procedure: LEFT HEART CATH AND CORONARY ANGIOGRAPHY;  Surgeon: Burnell Blanks, MD;  Location: [place] CV LAB;  Service: Cardiovascular;  Laterality: N/A;   LEFT HEART CATH AND CORONARY ANGIOGRAPHY N/A 02/08/2018   Procedure: LEFT HEART CATH AND CORONARY ANGIOGRAPHY;  Surgeon: Belva Crome, MD;  Location: [place] CV LAB;  Service: Cardiovascular;  Laterality: N/A;   LEFT HEART CATH AND CORONARY ANGIOGRAPHY N/A 10/29/2018   Procedure: LEFT HEART CATH AND CORONARY ANGIOGRAPHY;  Surgeon: Burnell Blanks, MD;  Location: [place] CV LAB;  Service: Cardiovascular;  Laterality: N/A;   LEFT HEART CATH AND CORS/GRAFTS ANGIOGRAPHY N/A 10/04/2020   Procedure: LEFT HEART CATH AND CORS/GRAFTS ANGIOGRAPHY;  Surgeon: Sherren Mocha, MD;  Location: [place] [facility];  Service: Cardiovascular;  Laterality: N/A;   LEFT HEART CATH AND CORS/GRAFTS ANGIOGRAPHY N/A 05/11/2021   Procedure: LEFT HEART CATH AND CORS/GRAFTS ANGIOGRAPHY;  Surgeon: Martinique, Peter M, MD;  Location: [place] CV LAB;  Service: Cardiovascular;  Laterality: N/A;   PARS PLANA VITRECTOMY Right 04/28/2021   Procedure: PARS PLANA VITRECTOMY WITH 25 GAUGE REMOVAL OF TRATIONAL MEMBRANE RIGHT EYE; DRAINAGE OF SUBRETINAL FLUID RIGHT  EYE;  Surgeon: Jalene Mullet, MD;  Location: [place];  Service: Ophthalmology;  Laterality: Right;  Right eye   PHOTOCOAGULATION WITH LASER Right 04/28/2021   Procedure: PHOTOCOAGULATION WITH LASER;  Surgeon: Jalene Mullet, MD;  Location: [place];  Service: Ophthalmology;  Laterality: Right;  Right eye   TEE WITHOUT CARDIOVERSION N/A 11/04/2018   Procedure: TRANSESOPHAGEAL ECHOCARDIOGRAM (TEE);  Surgeon: Wonda Olds, MD;  Location: [place];  Service: Open Heart Surgery;  Laterality: N/A;    Family History  Problem Relation Age of Onset   Cancer Mother        T cell lymphoma   Coronary artery disease Father    Congestive Heart Failure Father    Asthma Father     Social History Reviewed with no changes to be made today.   Outpatient Medications Prior to Visit  Medication Sig Dispense Refill   acetaminophen (TYLENOL) 325 MG tablet Take 325-650 mg by mouth daily as needed for mild pain, fever or headache.     albuterol (VENTOLIN HFA) 108 (90 Base) MCG/ACT inhaler INHALE 2 PUFFS INTO THE LUNGS EVERY 6 (SIX) HOURS AS NEEDED FOR WHEEZING OR SHORTNESS OF BREATH. (Patient taking differently: Inhale 1-2 puffs into the lungs every 6 (six) hours as needed for  wheezing or shortness of breath.) 8.5 g 2   aspirin 81 MG EC tablet Take 1 tablet (81 mg total) by mouth daily. (Patient taking differently: Take 81 mg by mouth at bedtime.) 90 tablet 0   atorvastatin (LIPITOR) 20 MG tablet Take 1 tablet (20 mg total) by mouth daily. 30 tablet 0   b complex vitamins tablet Take 1 tablet by mouth daily.     brimonidine (ALPHAGAN) 0.15 % ophthalmic solution Place 1 drop into the right eye 2 (two) times daily. 5 mL 0   budesonide (PULMICORT) 0.25 MG/2ML nebulizer solution Take 0.25 mg by nebulization as needed (shortness of breath/wheezing).     clopidogrel (PLAVIX) 75 MG tablet TAKE 1 TABLET BY MOUTH DAILY WITH BREAKFAST. (Patient taking differently: Take 75 mg by mouth at bedtime.) 90 tablet 3   diclofenac  Sodium (VOLTAREN) 1 % GEL Apply 2-4 g topically as needed (knee pain).     diphenhydrAMINE (BENADRYL) 25 MG tablet Take 25 mg by mouth at bedtime.     FLUoxetine (PROZAC) 40 MG capsule TAKE 1 CAPSULE BY MOUTH DAILY. (Patient taking differently: Take 40 mg by mouth daily.) 90 capsule 0   fluticasone (FLONASE) 50 MCG/ACT nasal spray Place 1 spray into both nostrils at bedtime as needed for allergies.     furosemide (LASIX) 20 MG tablet Take 1 tablet (20 mg total) by mouth daily as needed (for weight gain). 15 tablet 3   hydrOXYzine (ATARAX/VISTARIL) 25 MG tablet Take 1 tablet (25 mg total) by mouth 3 (three) times daily as needed. (Patient taking differently: Take 25 mg by mouth daily as needed for itching (allergies).) 60 tablet 1   insulin lispro (HUMALOG) 100 UNIT/ML injection Inject 35 units into the skin daily via pump 10 mL 1   levothyroxine (SYNTHROID) 100 MCG tablet TAKE 1 TABLET (100 MCG TOTAL) BY MOUTH DAILY BEFORE BREAKFAST. (Patient taking differently: Take 100 mcg by mouth daily before breakfast.) 90 tablet 3   metoprolol succinate (TOPROL-XL) 25 MG 24 hr tablet Take 0.5 tablets (12.5 mg total) by mouth daily. (Patient taking differently: Take 12.5 mg by mouth at bedtime.) 15 tablet 6   montelukast (SINGULAIR) 10 MG tablet Take 1 tablet (10 mg total) by mouth at bedtime. 30 tablet 4   Multiple Vitamins-Minerals (ALIVE WOMENS 50+ GUMMY) CHEW Chew 2 capsules by mouth daily.     nitroGLYCERIN (NITROSTAT) 0.4 MG SL tablet Place 1 tablet (0.4 mg total) under the tongue every 5 (five) minutes as needed for chest pain. 25 tablet 6   omeprazole (PRILOSEC OTC) 20 MG tablet Take 20 mg by mouth daily as needed (acid reflux).     OVER THE COUNTER MEDICATION Take 3 capsules by mouth daily before breakfast. [employer] Super Lysine Immune Support     OVER THE COUNTER MEDICATION Take 25 mg by mouth at bedtime as needed (sleep/joint pain). CBD oil     prednisoLONE acetate (PRED FORTE) 1 % ophthalmic  suspension Place 1 drop into affected eyes. three times a day for 1 week then  twice a day  for one week then once daily for one week 10 mL 0   metoCLOPramide (REGLAN) 5 MG tablet Take 1 tablet (5 mg total) by mouth every 12 (twelve) hours as needed for nausea or vomiting. 60 tablet 0   No facility-administered medications prior to visit.    Allergies  Allergen Reactions   Atorvastatin Other (See Comments)    Myalgias on '80mg'$  dose, tolerates '20mg'$  ok  Crestor [Rosuvastatin Calcium] Other (See Comments)    Severe myalgias and joint pain. Has tolerated atorvastatin though   Monosodium Glutamate Nausea And Vomiting and Other (See Comments)    migraine   Zetia [Ezetimibe] Other (See Comments)    myalgias   Ciprofloxin Hcl [Ciprofloxacin] Nausea And Vomiting and Other (See Comments)    Severe migraine   Food Color Red [Red Dye] Diarrhea       Objective:    BP 112/73 (BP Location: Left Arm, Patient Position: Sitting, Cuff Size: Normal)   Pulse 64   Temp 98.3 F (36.8 C) (Oral)   Resp 16   Ht '5\' 5"'$  (1.651 m)   Wt 145 lb (65.8 kg)   LMP 04/28/2021   SpO2 100%   BMI 24.13 kg/m  Wt Readings from Last 3 Encounters:  07/01/21 142 lb 12.8 oz (64.8 kg)  06/27/21 145 lb (65.8 kg)  06/01/21 131 lb 9.6 oz (59.7 kg)    Physical Exam Vitals and nursing note reviewed.  Constitutional:      Appearance: She is well-developed.  HENT:     Head: Normocephalic and atraumatic.  Cardiovascular:     Rate and Rhythm: Normal rate and regular rhythm.     Heart sounds: Normal heart sounds. No murmur heard.    No friction rub. No gallop.  Pulmonary:     Effort: Pulmonary effort is normal. No tachypnea or respiratory distress.     Breath sounds: Normal breath sounds. No decreased breath sounds, wheezing, rhonchi or rales.  Chest:     Chest wall: No tenderness.  Abdominal:     General: Bowel sounds are normal.     Palpations: Abdomen is soft.  Musculoskeletal:        General: Normal range of  motion.     Cervical back: Normal range of motion.  Skin:    General: Skin is warm and dry.  Neurological:     Mental Status: She is alert and oriented to person, place, and time.     Coordination: Coordination normal.  Psychiatric:        Behavior: Behavior normal. Behavior is cooperative.        Thought Content: Thought content normal.        Judgment: Judgment normal.          Patient has been counseled extensively about nutrition and exercise as well as the importance of adherence with medications and regular follow-up. The patient was given clear instructions to go to ER or return to medical center if symptoms don't improve, worsen or new problems develop. The patient verbalized understanding.   Follow-up: Return in about 3 months (around 09/27/2021) for pap smear .   [name], FNP-BC [facility], [place]   07/01/2021, 6:13 PM

## 2021-06-27 NOTE — Progress Notes (Signed)
F/u DM Depression/anxiety   CBG- 130 A1C 7.8

## 2021-06-29 ENCOUNTER — Other Ambulatory Visit: Payer: Self-pay

## 2021-07-01 ENCOUNTER — Ambulatory Visit (INDEPENDENT_AMBULATORY_CARE_PROVIDER_SITE_OTHER): Payer: Medicaid Other | Admitting: Internal Medicine

## 2021-07-01 ENCOUNTER — Encounter: Payer: Self-pay | Admitting: Internal Medicine

## 2021-07-01 ENCOUNTER — Encounter: Payer: Self-pay | Admitting: Nurse Practitioner

## 2021-07-01 ENCOUNTER — Other Ambulatory Visit: Payer: Self-pay

## 2021-07-01 VITALS — BP 110/68 | HR 68 | Ht 65.0 in | Wt 142.8 lb

## 2021-07-01 DIAGNOSIS — E1059 Type 1 diabetes mellitus with other circulatory complications: Secondary | ICD-10-CM

## 2021-07-01 DIAGNOSIS — E039 Hypothyroidism, unspecified: Secondary | ICD-10-CM

## 2021-07-01 LAB — POCT GLYCOSYLATED HEMOGLOBIN (HGB A1C): Hemoglobin A1C: 8 % — AB (ref 4.0–5.6)

## 2021-07-01 MED ORDER — GLUCAGON 3 MG/DOSE NA POWD
3.0000 mg | Freq: Once | NASAL | 11 refills | Status: DC | PRN
  Filled 2021-07-01 – 2021-08-10 (×2): qty 1, fill #0
  Filled 2021-12-03: qty 1, 28d supply, fill #0
  Filled 2021-12-19 – 2022-06-13 (×3): qty 1, 28d supply, fill #1

## 2021-07-01 NOTE — Progress Notes (Signed)
[name], female   DOB: 1970/03/26, 51 y.o.   MRN: [id number]  HPI: Jennifer Hogan is a 51 y.o.-year-old female, returning for follow-up for DM1, diagnosed in 1984, uncontrolled, with complications: CAD, s/p AMI, s/p PTCA and DES, ischemic cardiomyopathy, DKA, gastroparesis.  She previously saw Dr. Loanne Drilling, last visit 11/2020 (video).  She has abdominal pain and vomiting - no nausea. On Reglan daily now.  She has an appointment with GI coming up.  Reviewed HbA1c levels: Lab Results  Component Value Date   HGBA1C 8.7 (A) 03/25/2021   HGBA1C 8.5 (A) 10/11/2020   HGBA1C 8.7 (A) 10/08/2020   Insulin pump:  -since 2014 -Medtronic  CGM: -The Medtronic sensor was not affordable  Insulin: -Humalog  Supplies: -Medtronic  Pump settings: - basal rates: 12 am: 0.65 units/h 6 am: 0.675 10 am: 0.800 10 pm: 0.625 - ICR: 1:16 - target: 100-120 - ISF: 55 - Insulin on Board: 4 h - bolus wizard: on - extended bolusing: not using - changes infusion site: q3 days TDD from basal insulin: 60% TDD from bolus insulin: 40% Total daily dose: 25-50 units  Meter: Contour Next  Lowest sugar was 40 - at night; she has hypoglycemia awareness at 70.  She does not have a glucagon kit at home. No previous hypoglycemia admission.  Highest sugar was 600. + previous DKA admissions: 2020, 2021, 06/27/2021 (CGM defective).  - no CKD, last BUN/creatinine:  Lab Results  Component Value Date   BUN 12 06/01/2021   BUN 23 (H) 05/31/2021   CREATININE 0.86 06/01/2021   CREATININE 1.10 (H) 05/31/2021   - + HL; last set of lipids: Lab Results  Component Value Date   CHOL 258 (H) 05/11/2021   HDL 78 05/11/2021   LDLCALC 167 (H) 05/11/2021   LDLDIRECT 109 (H) 12/22/2019   TRIG 64 05/11/2021   CHOLHDL 3.3 05/11/2021  She tried Lipitor >> joint pain, mm aches >> not taking it >> sees the lipid clinic - will start Praluent.  - last eye exam was in 04/2021. + DR reportedly-needs  records.   - no numbness and tingling in her feet.  Last foot exam 10/08/2020.  Hypothyroidism:  Pt is on levothyroxine 100 mcg daily, taken: - in am - fasting - at least 60 min from b'fast - no calcium - no iron - no multivitamins - no PPIs - not on Biotin  Last TSH: Lab Results  Component Value Date   TSH 2.505 05/15/2021   She also has acid reflux.  She is on Reglan as needed for nausea.  ROS: She has increased urination as she drinks a lot of water.  She also has blurry vision (diabetic retinopathy and cataract in the right eye). No nausea, chest pain.  Past Medical History:  Diagnosis Date   Anemia    Anxiety    Coronary artery disease    a. s/p PTCA DES x3 in the LAD (ostial DES placed, mid DES placed, distal DES placed) and PTCA/DES x1 to intermediate branch 02/2017. b. ACS 02/08/18 with LCx stent placement.   Depression    Diabetes mellitus    type 1, patient is not using her insulin pump.   DKA (diabetic ketoacidoses) 12/31/2017   Dyspnea    with exertion and dust, no oxygen   Elevated platelet count    GERD (gastroesophageal reflux disease)    High cholesterol    Hypothyroidism    Ischemic cardiomyopathy    a. EF low-normal with basal  inferior akinesis, basal septal hypokinesis by echo 01/2018.   Myocardial infarction ([place]) 2019   Past Surgical History:  Procedure Laterality Date   APPENDECTOMY     CORONARY ARTERY BYPASS GRAFT N/A 11/04/2018   Procedure: CORONARY ARTERY BYPASS GRAFTING (CABG), ON PUMP, TIMES THREE, USING LEFT AND RIGHT INTERNAL MAMMARY ARTERIES;  Surgeon: Wonda Olds, MD;  Location: [facility];  Service: Open Heart Surgery;  Laterality: N/A;   CORONARY STENT INTERVENTION N/A 03/20/2017   Procedure: DES x 3 LAD, DES OM; Surgeon: Burnell Blanks, MD;  Location: [facility];  Service: Cardiovascular;  Laterality: N/A;   CORONARY/GRAFT ACUTE MI REVASCULARIZATION N/A 02/08/2018   Procedure: Coronary/Graft Acute MI  Revascularization;  Surgeon: Belva Crome, MD;  Location: [facility];  Service: Cardiovascular;  Laterality: N/A;   GAS INSERTION Right 04/28/2021   Procedure: INSERTION OF GAS ( C3F8);  Surgeon: Jalene Mullet, MD;  Location: [facility];  Service: Ophthalmology;  Laterality: Right;  Right eye   GAS/FLUID EXCHANGE Right 04/28/2021   Procedure: GAS/FLUID EXCHANGE;  Surgeon: Jalene Mullet, MD;  Location: [facility];  Service: Ophthalmology;  Laterality: Right;  Right eye   LEFT HEART CATH AND CORONARY ANGIOGRAPHY N/A 03/20/2017   Procedure: LEFT HEART CATH AND CORONARY ANGIOGRAPHY;  Surgeon: Burnell Blanks, MD;  Location: [facility];  Service: Cardiovascular;  Laterality: N/A;   LEFT HEART CATH AND CORONARY ANGIOGRAPHY N/A 02/08/2018   Procedure: LEFT HEART CATH AND CORONARY ANGIOGRAPHY;  Surgeon: Belva Crome, MD;  Location: [facility];  Service: Cardiovascular;  Laterality: N/A;   LEFT HEART CATH AND CORONARY ANGIOGRAPHY N/A 10/29/2018   Procedure: LEFT HEART CATH AND CORONARY ANGIOGRAPHY;  Surgeon: Burnell Blanks, MD;  Location: [facility];  Service: Cardiovascular;  Laterality: N/A;   LEFT HEART CATH AND CORS/GRAFTS ANGIOGRAPHY N/A 10/04/2020   Procedure: LEFT HEART CATH AND CORS/GRAFTS ANGIOGRAPHY;  Surgeon: Sherren Mocha, MD;  Location: [facility];  Service: Cardiovascular;  Laterality: N/A;   LEFT HEART CATH AND CORS/GRAFTS ANGIOGRAPHY N/A 05/11/2021   Procedure: LEFT HEART CATH AND CORS/GRAFTS ANGIOGRAPHY;  Surgeon: Martinique, Peter M, MD;  Location: [facility];  Service: Cardiovascular;  Laterality: N/A;   PARS PLANA VITRECTOMY Right 04/28/2021   Procedure: PARS PLANA VITRECTOMY WITH 25 GAUGE REMOVAL OF TRATIONAL MEMBRANE RIGHT EYE; DRAINAGE OF SUBRETINAL FLUID RIGHT EYE;  Surgeon: Jalene Mullet, MD;  Location: [facility];  Service: Ophthalmology;  Laterality: Right;  Right eye   PHOTOCOAGULATION WITH LASER Right 04/28/2021   Procedure:  PHOTOCOAGULATION WITH LASER;  Surgeon: Jalene Mullet, MD;  Location: [facility];  Service: Ophthalmology;  Laterality: Right;  Right eye   TEE WITHOUT CARDIOVERSION N/A 11/04/2018   Procedure: TRANSESOPHAGEAL ECHOCARDIOGRAM (TEE);  Surgeon: Wonda Olds, MD;  Location: [facility];  Service: Open Heart Surgery;  Laterality: N/A;   Social History   Socioeconomic History   Marital status: Divorced    Spouse name: Not on file   Number of children: Not on file   Years of education: Not on file   Highest education level: Professional school degree (e.g., MD, DDS, DVM, [place])  Occupational History   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"   Occupation: adjunct facilty at [employer]    Comment: Chemistry professor  Tobacco Use   Smoking status: Never   Smokeless tobacco: Never   Tobacco comments:    Use marijuana  Vaping Use   Vaping Use: Never  used  Substance and Sexual Activity   Alcohol use: No    Alcohol/week: 0.0 standard drinks of alcohol   Drug use: Not Currently    Types: Marijuana    Comment: last use 04/25/21   Sexual activity: Not Currently    Birth control/protection: None  Other Topics Concern   Not on file  Social History Narrative   Not on file   Social Determinants of Health   Financial Resource Strain: High Risk (10/05/2020)   Overall Financial Resource Strain (CARDIA)    Difficulty of Paying Living Expenses: Very hard  Food Insecurity: Food Insecurity Present (10/05/2020)   Hunger Vital Sign    Worried About Running Out of Food in the Last Year: Sometimes true    Ran Out of Food in the Last Year: Sometimes true  Transportation Needs: No Transportation Needs (10/05/2020)   PRAPARE - Hydrologist (Medical): No    Lack of Transportation (Non-Medical): No  Physical Activity: Not on file  Stress: Not on file  Social Connections: Unknown (12/07/2017)   Social Connection and Isolation Panel [NHANES]    Frequency of Communication with Friends and  Family: Patient refused    Frequency of Social Gatherings with Friends and Family: Patient refused    Attends Religious Services: Patient refused    Active Member of Clubs or Organizations: Patient refused    Attends Archivist Meetings: Patient refused    Marital Status: Patient refused  Intimate Partner Violence: Not At Risk (12/07/2017)   Humiliation, Afraid, Rape, and Kick questionnaire    Fear of Current or Ex-Partner: No    Emotionally Abused: No    Physically Abused: No    Sexually Abused: No   Current Outpatient Medications on File Prior to Visit  Medication Sig Dispense Refill   acetaminophen (TYLENOL) 325 MG tablet Take 325-650 mg by mouth daily as needed for mild pain, fever or headache.     albuterol (VENTOLIN HFA) 108 (90 Base) MCG/ACT inhaler INHALE 2 PUFFS INTO THE LUNGS EVERY 6 (SIX) HOURS AS NEEDED FOR WHEEZING OR SHORTNESS OF BREATH. (Patient taking differently: Inhale 1-2 puffs into the lungs every 6 (six) hours as needed for wheezing or shortness of breath.) 8.5 g 2   aspirin 81 MG EC tablet Take 1 tablet (81 mg total) by mouth daily. (Patient taking differently: Take 81 mg by mouth at bedtime.) 90 tablet 0   atorvastatin (LIPITOR) 20 MG tablet Take 1 tablet (20 mg total) by mouth daily. 30 tablet 0   b complex vitamins tablet Take 1 tablet by mouth daily.     brimonidine (ALPHAGAN) 0.15 % ophthalmic solution Place 1 drop into the right eye 2 (two) times daily. 5 mL 0   budesonide (PULMICORT) 0.25 MG/2ML nebulizer solution Take 0.25 mg by nebulization as needed (shortness of breath/wheezing).     clopidogrel (PLAVIX) 75 MG tablet TAKE 1 TABLET BY MOUTH DAILY WITH BREAKFAST. (Patient taking differently: Take 75 mg by mouth at bedtime.) 90 tablet 3   diclofenac Sodium (VOLTAREN) 1 % GEL Apply 2-4 g topically as needed (knee pain).     diphenhydrAMINE (BENADRYL) 25 MG tablet Take 25 mg by mouth at bedtime.     FLUoxetine (PROZAC) 40 MG capsule TAKE 1 CAPSULE BY  MOUTH DAILY. (Patient taking differently: Take 40 mg by mouth daily.) 90 capsule 0   fluticasone (FLONASE) 50 MCG/ACT nasal spray Place 1 spray into both nostrils at bedtime as needed for allergies.  furosemide (LASIX) 20 MG tablet Take 1 tablet (20 mg total) by mouth daily as needed (for weight gain). 15 tablet 3   hydrOXYzine (ATARAX/VISTARIL) 25 MG tablet Take 1 tablet (25 mg total) by mouth 3 (three) times daily as needed. (Patient taking differently: Take 25 mg by mouth daily as needed for itching (allergies).) 60 tablet 1   insulin lispro (HUMALOG) 100 UNIT/ML injection Inject 35 units into the skin daily via pump 10 mL 1   levothyroxine (SYNTHROID) 100 MCG tablet TAKE 1 TABLET (100 MCG TOTAL) BY MOUTH DAILY BEFORE BREAKFAST. (Patient taking differently: Take 100 mcg by mouth daily before breakfast.) 90 tablet 3   metoCLOPramide (REGLAN) 5 MG tablet Take 1 tablet (5 mg total) by mouth every 12 (twelve) hours as needed for nausea or vomiting. 60 tablet 3   metoprolol succinate (TOPROL-XL) 25 MG 24 hr tablet Take 0.5 tablets (12.5 mg total) by mouth daily. (Patient taking differently: Take 12.5 mg by mouth at bedtime.) 15 tablet 6   montelukast (SINGULAIR) 10 MG tablet Take 1 tablet (10 mg total) by mouth at bedtime. 30 tablet 4   Multiple Vitamins-Minerals (ALIVE WOMENS 50+ GUMMY) CHEW Chew 2 capsules by mouth daily.     nitroGLYCERIN (NITROSTAT) 0.4 MG SL tablet Place 1 tablet (0.4 mg total) under the tongue every 5 (five) minutes as needed for chest pain. 25 tablet 6   omeprazole (PRILOSEC OTC) 20 MG tablet Take 20 mg by mouth daily as needed (acid reflux).     OVER THE COUNTER MEDICATION Take 3 capsules by mouth daily before breakfast. [employer] Super Lysine Immune Support     OVER THE COUNTER MEDICATION Take 25 mg by mouth at bedtime as needed (sleep/joint pain). CBD oil     prednisoLONE acetate (PRED FORTE) 1 % ophthalmic suspension Place 1 drop into affected eyes. three times a day  for 1 week then  twice a day  for one week then once daily for one week 10 mL 0   No current facility-administered medications on file prior to visit.   Allergies  Allergen Reactions   Atorvastatin Other (See Comments)    Myalgias on 21m dose, tolerates 294mok   Crestor [Rosuvastatin Calcium] Other (See Comments)    Severe myalgias and joint pain. Has tolerated atorvastatin though   Monosodium Glutamate Nausea And Vomiting and Other (See Comments)    migraine   Zetia [Ezetimibe] Other (See Comments)    myalgias   Ciprofloxin Hcl [Ciprofloxacin] Nausea And Vomiting and Other (See Comments)    Severe migraine   Food Color Red [Red Dye] Diarrhea   Family History  Problem Relation Age of Onset   Cancer Mother        T cell lymphoma   Coronary artery disease Father    Congestive Heart Failure Father    Asthma Father     PE: BP 110/68 (BP Location: Right Arm, Patient Position: Sitting, Cuff Size: Normal)   Pulse 68   Ht _0  (1.651 m)   Wt 142 lb 12.8 oz (64.8 kg)   LMP 04/28/2021   SpO2 98%   BMI 23.76 kg/m  Wt Readings from Last 3 Encounters:  07/01/21 142 lb 12.8 oz (64.8 kg)  06/27/21 145 lb (65.8 kg)  06/01/21 131 lb 9.6 oz (59.7 kg)   Constitutional: normal weight, anxious appearing Eyes: EOMI, no exophthalmos ENT: moist mucous membranes, no masses palpated in neck, no cervical lymphadenopathy Cardiovascular: RRR, No MRG Respiratory: CTA B Musculoskeletal: no  deformities Skin: moist, warm, + vitiligo on arms and chest Neurological: no tremor with outstretched hands  ASSESSMENT: 1. DM1, uncontrolled, with complications - DKA - CAD, s/p AMI, s/p PTCA - DES; s/p NSTEMI 0418/2023 - iCMP - gastroparesis  2. Hypothyroidism  PLAN:  1. Patient with long-standing, uncontrolled DM1.  Latest HbA1c obtained 3 months ago was above target, at 8.7%.  At today's visit, we rechecked her HbA1c and this is lower, at 8.0%.  She was recently admitted for DKA and she mentions  that this is due to the fact that her symptoms was malfunctioning.  It was showing her 24s and she continues to drink soda to bring her sugars up when in fact the sensor was not under her skin.  She started to feel poorly by the time she got to the emergency room, sugars are in the 600s. CGM interpretation: -At today's visit, we reviewed her CGM downloads: It appears that 72% of values are in target range (goal >70%), while 18% are higher than 180 (goal <25%), and 10% are lower than 70 (goal <4%).  The calculated average blood sugar is 132.  The projected HbA1c for the next 3 months (GMI) is lower than 7%. -Reviewing the CGM trends, it appears that her sugars are fluctuating, but usually higher after lunch and especially after dinner.  Problems identified: She is not bolusing 15 minutes before meals, but many times, she boluses when the sugars are already up after a meal.  As a consequence, her sugars are dropping too low afterwards.  She does have gastroparesis and we discussed about trying to bolus 15 minutes before each meal, however, to move the boluses closer to the meal if needed depending on the blood sugars.  However, entering carbs and taking the bolus when the sugars already high after a meal is not conducive to good diabetes control Sugars are dropping overnight but this is most likely due to bolusing for dinner too late as mentioned above She is not entering all of the carbs into the pump.  We discussed about trying to enter blood sugars, enter carbs, and bolus before every meal. She changes her insulin in the reservoir and her infusion site less than every 7 days.  We discussed about inflammation at the site and inconsistent absorption if she does that.  She was trying to ration her supplies from Medtronic, but she is waiting for new supplies now.   She is not in the auto mode.  She states in the manual mode and she did not notice that.  I advised her to start the automatic mode She is stopping  the pump for several hours a day.  We discussed that this is dangerous.  She mentions that many times when she sees a low blood sugar, especially during the night, she stops the pump and that she forgets to turn it on.  We discussed that she should not stop the pump, but correct the low blood sugar with scars and leave the pump going. - I suggested to: Patient Instructions   Try to read the book: "Think at your pancreas".  Please use the following pump settings: - basal rates: 12 am: 0.650 units/h 6 am: 0.675 10 am: 0.800 10 pm: 0.625 - ICR: 1:16 - target: 100-120 - ISF: 55 - Insulin on Board: 4h  Please start the auto mode.  Please do the following approximately 15 minutes before every meal: - Enter carbs (C) - Enter sugars (S) - Start insulin bolus (I)  Please continue Levothyroxine 100 mcg daily.  Take the thyroid hormone every day, with water, at least 30 minutes before breakfast, separated by at least 4 hours from: - acid reflux medications - calcium - iron - multivitamins  Please return in 3-4 months.  - Strongly advised her to start checking sugars at different times of the day - check at least 4 times a day, rotating checks  - given instructions for hypoglycemia management "15-15 rule"  - advised for yearly eye exams - sent glucagon intranasal kit Rx to pharmacy - advised to get ketone strips - advised to always have Glu tablets with her - advised for a Med-alert bracelet mentioning "type 1 diabetes mellitus". - given instruction Re: exercising and driving in DM1 (pt instructions) - also, given information about sick day rules - +  other autoimmune disorders -she has vitiligo.  She also has hypothyroidism, which could be related to Hashimoto's thyroiditis. - suggested reference book for better diabetes control. - Return to clinic in 3-4 months  2.  Hypothyroidism - latest thyroid labs reviewed with pt. >> normal: Lab Results  Component Value Date   TSH 2.505  05/15/2021  - she continues on LT4 100 mcg daily - pt feels good on this dose. - we discussed about taking the thyroid hormone every day, with water, >30 minutes before breakfast, separated by >4 hours from acid reflux medications, calcium, iron, multivitamins. Pt. is taking it correctly.  - Total time spent for the visit: 45 min, in precharting, obtaining medical information from the chart and from the patient, reviewing Dr. Cordelia Pen last note, her  previous labs, evaluations, and treatments, reviewing her symptoms, counseling her about her condition (please see the discussed topics above), and developing a plan to further treat it.  Philemon Kingdom, MD PhD [facility]

## 2021-07-01 NOTE — Patient Instructions (Addendum)
Try to read the book: "Think at your pancreas".  Please use the following pump settings: - basal rates: 12 am: 0.650 units/h 6 am: 0.675 10 am: 0.800 10 pm: 0.625 - ICR: 1:16 - target: 100-120 - ISF: 55 - Insulin on Board: 4h  Please start the auto mode.  Please do the following approximately 15 minutes before every meal: - Enter carbs (C) - Enter sugars (S) - Start insulin bolus (I)  Please continue Levothyroxine 100 mcg daily.  Take the thyroid hormone every day, with water, at least 30 minutes before breakfast, separated by at least 4 hours from: - acid reflux medications - calcium - iron - multivitamins  Please return in 3-4 months.  Basic Rules for Patients with Type I Diabetes Mellitus  The American Diabetes Association (ADA) recommended targets: - fasting sugar 80-130 - after meal sugar <180 - HbA1C <7%  Engage in ?150 min moderate exercise per week  Make sure you have ?8h of sleep every night as this helps both blood sugars and your weight.  Always keep a sugar log (not only record in your meter) and bring it to all appointments with [name].  If you are on a pump, know how to access the settings and to modify the parameters.   Remember, you can always call the number on the back of the pump for emergencies related to the pump.  "15-15 rule" for hypoglycemia: if sugars are low, take 15 g of carbs** ("fast sugar" - e.g. 4 glucose tablets, 4 oz orange juice), wait 15 min, then check sugars again. If still <80, repeat. Continue  until your sugars >80, then eat a normal meal.   Teach family members and coworkers to inject glucagon. Have a glucagon set at home and one at work. They should call 911 after using the set.  If you are on a pump, set "insulin on board" time for 5 hours (if your sugars tend to be higher, can use 4 hours).   If you are on a pump, use the "dual wave bolus" setting for high fat foods (e.g. pizza). Start with a setting of 50%-50% (50%  instant bolus and 50% prolonged bolus over 3h, for e.g.).    If you are on a pump, make sure the basal daily insulin dose is approximately equal (not larger) to the daily insulin you get from boluses, otherwise you are at risk for hypoglycemia.  Check sugar before driving. If <100, correct, and only start driving if sugars rise ?100. Check sugar every hour when on a long drive.  Check sugar before exercising. If <100, correct, and only start exercising if sugars rise ?100. Check sugar every hour when on a long exercise routine and 1h after you finished exercising.   If >250, check urine for ketones. If you have moderate-large ketones in urine, do not start exercise. Hydrate yourself with clear liquids and correct the high sugar. Recheck sugars and ketones before attempting to exercise.  Be aware that you might need less insulin when exercising.  *intense, short, exercise bursts can increase your sugars, but  *less intense, longer (>1h), exercise routines can decrease your sugars.  If you are on a pump, you might need to decrease your basal rate by 10% or more (or even disconnect your pump) while you exercise to prevent low sugars. Do not disconnect your pump by more than 3 hours at a time! You also might need to decrease your insulin bolus for the meal prior to your exercise time  by 20% or more.  Make sure you have a MedAlert bracelet or pendant mentioning "Type I Diabetes Mellitus". If you have a prior episode of severe hypoglycemia or hypoglycemia unawareness, it should also mention this.  Please do not walk barefoot. Inspect your feet for sores/cuts and let us know if you have them.  **E.g. of "fast carbs": first choice (15 g):  1 tube glucose gel, GlucoPouch 15, 2 oz glucose liquid second choice (15-16 g):  3 or 4 glucose tablets (best taken  with water), 15 Dextrose Bits chewable third choice (15-20 g):   cup fruit juice,  cup regular soda, 1 cup skim milk,  1 cup sports  drink fourth choice (15-20 g):  1 small tube [place] gel (not frosting), 2 tbsp raisins, 1 tbsp table sugar,  candy, jelly beans, gum drops - check package for carb amount   (adapted from: Lenice Pressman. "Insulin therapy and hypoglycemia" Endocrinol Metab Clin N Am 2012, 41: 57-87)  Sick Day Rules for Diabetes  Think S-K-I-L-L:  Sugars:  - if glucose >200, check every 3h and drink sugar free liquids  - if glucose <200, drink carb-containing liquids and recheck 30 min later  - if glucose high, correct with insulin  - if sugars <60, initiate hypoglycemia management (take 15 g of fast carbs and check sugars in 15 min  - repeat until sugars remain >100).  Ketones:  When to check ketones?  When glucose >300 x2 if on insulin injections (>300 x 1 if on insulin pump). When nausea, vomiting, diarrhea, abdominal pain, headache, fever - even if glucose is normal or low - because in this case, you need both glucose and insulin.    - if you have ketone strips for blood >> if ketones are more or equal than 0.6, need to increase insulin - if you have ketone strips for urine >> if ketones are more or equal than "small", need to increase insulin  Insulin: Never skip long acting insulin, even if not eating!   Urine ketones Blood ketones Extra insulin?  no <0.6 no  small 0.6-1.5 Increase dose by 5%  moderate 1.5-3 Increase dose by 10%  large >3 Increase dose by at least 20%   Liquids: - if glucose >200, check every 3h and drink sugar free liquids  - if glucose <200, small sips of carb-containing liquids (e.g. Ginger ale, Gatorade, juice, etc.)  Let us know!   Call us if: Go to ED if: Call your primary care doctor if:  Sugars >300 for >8h Severe abdominal pain Fever >100F for 24h  Moderate to large  urine ketones or blood ketones >1.5 Difficulty breathing Other chronic diseases flaring up  Vomiting and unable to keep liquids down Signs of dehydration

## 2021-07-03 NOTE — Progress Notes (Unsigned)
07/03/2021 Jennifer Hogan [id number] 01-Apr-1970   CHIEF COMPLAINT: Recurrent nausea, vomiting and diarrhea   HISTORY OF PRESENT ILLNESS: Jennifer Hogan is a 51 year old female with a past medical history anxiety, depression, questionable asthma, coronary artery disease s/p MI 2019 s/p DES x 4  Feb. 2019, S/P DES x 23 Jan 2018 on Plavix, s/p 3 vessel CABG 10/2018, s/p multiple NSTEMIs as listed below, ischemic cardiomyopathy, hypercholesterolemia, hypothyroidism, DM type  on an insulin pump, DKA, suspected diabetic gastroparesis and GERD.  She presents to our office today as referred by [name] NP to discuss colon cancer screening and for further evaluation regarding recurrent episodes of N/V/D requiring numerous hospital admissions since 2019 with DKA and multiple NSTEMIs.  She is mostly concerned regarding the connection between her episodes of N/V/D which result in significant cardiac events.   See hospital summary below which includes numerous but not all hospital admissions from 2019 to 05/2021.  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  Echo showed EF 40 to 55% with hypokinesis of the apical myocardium. She underwent a cardiac cath which showed severe triple-vessel CAD, received stent x 4, PTCA/DES x 3 in the LAD (ostial DES placed, mid DES placed, distal DES placed) and PTCA/DES x 1 intermediate branch.  Discharged home on aspirin, Brilinta and a statin.   Admitted to the hospital 1/17 - 02/11/2018 with nausea, DKA, CP and NSTEMI. She underwent a cardiac catheterization, s/p  PTCA/DES to proximal/ostial circumflex was placed.   Admitted to the hospital 5/1 - 05/27/2018 with N/V/D, DKA and CP, elevated troponin, likely demand ischemia.   Admitted to the hospital 10/29/2018 with N/V and CP diagnosed with NSTEMI and subsequently underwent 3 vessel CABG.   Admitted to  the hospital 10/31/ - 11/26/2018 with N/V and epigastric pain. Diagnosed with SIRS secondary to DKA.  Admitted to the hospital 9/8 - 10/06/2020 with DKA, Stress STEMI type II MI, CHF with EF 20% and acute respiratory failure secondary to acute diastolic CHF and strep bacteremia.   Admitted to the hospital 4/18 - 05/17/2021 with CP, NSTEMI, cardiac catheterization without significant findings, acute respiratory failure  with SIRS/sepsis.   Admitted to the hospital  5/8 - 06/01/2021 with N/V/D x 3 days with DKA.  CTAP 05/30/2021 without intra-abdominal/pelvic pathology to explain her symptoms.  She is concerned her cycles of N/V/D result in recurrent MIs and worsening cardiac status. Prior to the onset of N/V/D episodes she feels well. She first notices her appetite decreases for a few days, then feels constipated, no BM for a few days then develops abdominal bloat and the N/V and diarrhea cycle starts. She vomits partially digested food until her food completely empties out then has dry heaves.  No coffee-ground emesis or frank hematemesis.  She has epigastric pain during these episodes which persists for a few hours then feels sore from vomiting. No bloody diarrhea.  No melena.  Infrequent NSAID use. N/V/D episodes typically do not abate until she receives antinausea and pain medications in the ED.  Currently, she denies having any nausea or vomiting.  No abdominal pain.    She typically takes Reglan 5 mg p.o. once or twice daily.  She has ondansetron which she takes less frequently.  She takes Omeprazole 20 mg once daily as needed for intermittent heartburn.  No dysphagia.  She denies ever having an EGD.  She  was suspected to have diabetic gastroparesis, denies ever having a gastric emptying study.  No alcohol use.  She smokes marijuana once or twice monthly.  She passed a soft brown stool earlier today.  She can go several days without passing a bowel movement, often does not feel emptied.  She reported  undergoing a colonoscopy in 2017 by gastroenterologist in [facility] which she reported was normal.  Mother or father with history of colon polyps.      Latest Ref Rng & Units 05/30/2021    2:29 PM 05/30/2021    2:08 PM 05/17/2021    3:39 AM  CBC  WBC 4.0 - 10.5 K/uL  17.5  6.8   Hemoglobin 12.0 - 15.0 g/dL 14.6  13.0  10.5   Hematocrit 36.0 - 46.0 % 43.0  40.5  31.8   Platelets 150 - 400 K/uL  564  411        Latest Ref Rng & Units 06/01/2021    4:25 AM 05/31/2021    5:16 AM 05/31/2021    2:30 AM  CMP  Glucose 70 - 99 mg/dL 149  162  181   BUN 6 - 20 mg/dL _0 Creatinine 0.44 - 1.00 mg/dL 0.86  1.10  1.05   Sodium 135 - 145 mmol/L 137  135  134   Potassium 3.5 - 5.1 mmol/L 3.5  4.5  4.0   Chloride 98 - 111 mmol/L 108  106  106   CO2 22 - 32 mmol/L _1 Calcium 8.9 - 10.3 mg/dL 8.9  9.4  9.3        Latest Ref Rng & Units 05/30/2021    2:08 PM 05/16/2021   12:15 AM 05/15/2021    9:32 PM  Hepatic Function  Total Protein 6.5 - 8.1 g/dL 7.6   6.6   Albumin 3.5 - 5.0 g/dL 4.1  3.0  3.2   AST 15 - 41 U/L 26   24   ALT 0 - 44 U/L 24   15   Alk Phosphatase 38 - 126 U/L 79   70   Total Bilirubin 0.3 - 1.2 mg/dL 2.3   0.5     CTAP without contrast 05/30/2021: FINDINGS: Lower chest: No acute abnormality.   Hepatobiliary: Unremarkable unenhanced appearance of the liver. No focal liver lesion identified. Gallbladder within normal limits. No hyperdense gallstone. No biliary dilatation.   Pancreas: Unremarkable. No pancreatic ductal dilatation or surrounding inflammatory changes.   Spleen: Normal in size without focal abnormality.   Adrenals/Urinary Tract: No adrenal hemorrhage or renal injury identified. Urinary bladder is distended to the level of the umbilicus.   Stomach/Bowel: Stomach is within normal limits. Appendix not visualized and may be surgically absent. No evidence of bowel wall thickening, distention, or inflammatory changes.   Vascular/Lymphatic:  Scattered aortoiliac atherosclerotic calcifications without aneurysm. No abdominopelvic lymphadenopathy.   Reproductive: Uterus and bilateral adnexa are unremarkable.   Other: No free fluid. No abdominopelvic fluid collection. No pneumoperitoneum. No abdominal wall hernia.   Musculoskeletal: Degenerative disc disease of L3-4 and L4-5. No new or acute bony findings.   IMPRESSION: 1. The urinary bladder is distended to the level of the umbilicus. Correlate for urinary retention. 2. Otherwise, no acute abdominopelvic findings. 3. Aortic Atherosclerosis    ECHO 05/11/2021: IMPRESSIONS Diffuse hypokinesis worse in septum and apex . Left ventricular ejection fraction, by estimation, is 30 to 35%. The left ventricle has moderately decreased function.  The left ventricle demonstrates global hypokinesis. The left ventricular internal cavity size was severely dilated. Left ventricular diastolic parameters are indeterminate. 1. Right ventricular systolic function is normal. The right ventricular size is normal. There is normal pulmonary artery systolic pressure. 2. The mitral valve is abnormal. Mild mitral valve regurgitation. No evidence of mitral stenosis. 3. The aortic valve is tricuspid. Aortic valve regurgitation is not visualized. No aortic stenosis is present. 4. The inferior vena cava is normal in size with greater than 50% respiratory variability, suggesting right atrial pressure of 3 mmHg.  Cardiac catheterization 05/11/2021:    Prox LAD to Mid LAD lesion is 100% stenosed.   Dist LAD-1 lesion is 70% stenosed.   Dist LAD-2 lesion is 70% stenosed.   Mid LAD lesion is 60% stenosed.   Ost Cx to Prox Cx lesion is 99% stenosed.   Prox Cx to Mid Cx lesion is 70% stenosed.   Ramus-1 lesion is 60% stenosed.   Ramus-2 lesion is 20% stenosed.   Ramus-3 lesion is 70% stenosed.   RPAV lesion is 60% stenosed.   Non-stenotic Mid LAD to Dist LAD lesion was previously treated.   LIMA  graft was visualized by angiography and is normal in caliber.   RIMA graft was visualized by non-selective angiography and is normal in caliber.   The graft exhibits no disease.   LV end diastolic pressure is moderately elevated.   Severe 2 vessel obstructive CAD Patent sequential LIMA graft  to the ramus intermediate and distal LAD Patent RIMA to the distal LCx Moderately elevated LVEDP   Compared to September 2022 there is no significant angiographic change. Recommend continued medical therapy   Past Medical History:  Diagnosis Date   Anemia    Anxiety    Coronary artery disease    a. s/p PTCA DES x3 in the LAD (ostial DES placed, mid DES placed, distal DES placed) and PTCA/DES x1 to intermediate branch 02/2017. b. ACS 02/08/18 with LCx stent placement.   Depression    Diabetes mellitus    type 1, patient is not using her insulin pump.   DKA (diabetic ketoacidoses) 12/31/2017   Dyspnea    with exertion and dust, no oxygen   Elevated platelet count    GERD (gastroesophageal reflux disease)    High cholesterol    Hypothyroidism    Ischemic cardiomyopathy    a. EF low-normal with basal inferior akinesis, basal septal hypokinesis by echo 01/2018.   Myocardial infarction ([place]) 2019   Past Surgical History:  Procedure Laterality Date   APPENDECTOMY     CORONARY ARTERY BYPASS GRAFT N/A 11/04/2018   Procedure: CORONARY ARTERY BYPASS GRAFTING (CABG), ON PUMP, TIMES THREE, USING LEFT AND RIGHT INTERNAL MAMMARY ARTERIES;  Surgeon: Wonda Olds, MD;  Location: [place];  Service: Open Heart Surgery;  Laterality: N/A;   CORONARY STENT INTERVENTION N/A 03/20/2017   Procedure: DES x 3 LAD, DES OM; Surgeon: Burnell Blanks, MD;  Location: [place] [facility];  Service: Cardiovascular;  Laterality: N/A;   CORONARY/GRAFT ACUTE MI REVASCULARIZATION N/A 02/08/2018   Procedure: Coronary/Graft Acute MI Revascularization;  Surgeon: Belva Crome, MD;  Location: [place] [facility];  Service:  Cardiovascular;  Laterality: N/A;   GAS INSERTION Right 04/28/2021   Procedure: INSERTION OF GAS ( C3F8);  Surgeon: Jalene Mullet, MD;  Location: [place];  Service: Ophthalmology;  Laterality: Right;  Right eye   GAS/FLUID EXCHANGE Right 04/28/2021   Procedure: GAS/FLUID EXCHANGE;  Surgeon: Jalene Mullet, MD;  Location: [place] [facility];  Service: Ophthalmology;  Laterality: Right;  Right eye   LEFT HEART CATH AND CORONARY ANGIOGRAPHY N/A 03/20/2017   Procedure: LEFT HEART CATH AND CORONARY ANGIOGRAPHY;  Surgeon: Burnell Blanks, MD;  Location: [place] [facility];  Service: Cardiovascular;  Laterality: N/A;   LEFT HEART CATH AND CORONARY ANGIOGRAPHY N/A 02/08/2018   Procedure: LEFT HEART CATH AND CORONARY ANGIOGRAPHY;  Surgeon: Belva Crome, MD;  Location: [place] [facility];  Service: Cardiovascular;  Laterality: N/A;   LEFT HEART CATH AND CORONARY ANGIOGRAPHY N/A 10/29/2018   Procedure: LEFT HEART CATH AND CORONARY ANGIOGRAPHY;  Surgeon: Burnell Blanks, MD;  Location: [place] [facility];  Service: Cardiovascular;  Laterality: N/A;   LEFT HEART CATH AND CORS/GRAFTS ANGIOGRAPHY N/A 10/04/2020   Procedure: LEFT HEART CATH AND CORS/GRAFTS ANGIOGRAPHY;  Surgeon: Sherren Mocha, MD;  Location: [place] [facility];  Service: Cardiovascular;  Laterality: N/A;   LEFT HEART CATH AND CORS/GRAFTS ANGIOGRAPHY N/A 05/11/2021   Procedure: LEFT HEART CATH AND CORS/GRAFTS ANGIOGRAPHY;  Surgeon: Martinique, Peter M, MD;  Location: [place] [facility];  Service: Cardiovascular;  Laterality: N/A;   PARS PLANA VITRECTOMY Right 04/28/2021   Procedure: PARS PLANA VITRECTOMY WITH 25 GAUGE REMOVAL OF TRATIONAL MEMBRANE RIGHT EYE; DRAINAGE OF SUBRETINAL FLUID RIGHT EYE;  Surgeon: Jalene Mullet, MD;  Location: [place];  Service: Ophthalmology;  Laterality: Right;  Right eye   PHOTOCOAGULATION WITH LASER Right 04/28/2021   Procedure: PHOTOCOAGULATION WITH LASER;  Surgeon: Jalene Mullet, MD;  Location: [place];  Service:  Ophthalmology;  Laterality: Right;  Right eye   TEE WITHOUT CARDIOVERSION N/A 11/04/2018   Procedure: TRANSESOPHAGEAL ECHOCARDIOGRAM (TEE);  Surgeon: Wonda Olds, MD;  Location: [place];  Service: Open Heart Surgery;  Laterality: N/A;   Social History: She is divorced. Retired Network engineer. Marijuana once or twice weekly. No alcohol.   Family History: Mother cutaneous T cell lymphoma. Father with GERD, heart diease and CHF, intractable N/V.  Paternal uncle died from esophageal cancer, he was a smoker.   Allergies  Allergen Reactions   Atorvastatin Other (See Comments)    Myalgias on 27m dose, tolerates 276mok   Crestor [Rosuvastatin Calcium] Other (See Comments)    Severe myalgias and joint pain. Has tolerated atorvastatin though   Monosodium Glutamate Nausea And Vomiting and Other (See Comments)    migraine   Zetia [Ezetimibe] Other (See Comments)    myalgias   Ciprofloxin Hcl [Ciprofloxacin] Nausea And Vomiting and Other (See Comments)    Severe migraine   Food Color Red [Red Dye] Diarrhea      Outpatient Encounter Medications as of 07/04/2021  Medication Sig   acetaminophen (TYLENOL) 325 MG tablet Take 325-650 mg by mouth daily as needed for mild pain, fever or headache.   albuterol (VENTOLIN HFA) 108 (90 Base) MCG/ACT inhaler INHALE 2 PUFFS INTO THE LUNGS EVERY 6 (SIX) HOURS AS NEEDED FOR WHEEZING OR SHORTNESS OF BREATH. (Patient taking differently: Inhale 1-2 puffs into the lungs every 6 (six) hours as needed for wheezing or shortness of breath.)   aspirin 81 MG EC tablet Take 1 tablet (81 mg total) by mouth daily. (Patient taking differently: Take 81 mg by mouth at bedtime.)   atorvastatin (LIPITOR) 20 MG tablet Take 1 tablet (20 mg total) by mouth daily.   b complex vitamins tablet Take 1 tablet by mouth daily.   brimonidine (ALPHAGAN) 0.15 % ophthalmic solution Place 1 drop into the right eye 2 (two) times daily.  budesonide (PULMICORT) 0.25 MG/2ML nebulizer solution Take  0.25 mg by nebulization as needed (shortness of breath/wheezing).   clopidogrel (PLAVIX) 75 MG tablet TAKE 1 TABLET BY MOUTH DAILY WITH BREAKFAST. (Patient taking differently: Take 75 mg by mouth at bedtime.)   diclofenac Sodium (VOLTAREN) 1 % GEL Apply 2-4 g topically as needed (knee pain).   diphenhydrAMINE (BENADRYL) 25 MG tablet Take 25 mg by mouth at bedtime.   FLUoxetine (PROZAC) 40 MG capsule TAKE 1 CAPSULE BY MOUTH DAILY. (Patient taking differently: Take 40 mg by mouth daily.)   fluticasone (FLONASE) 50 MCG/ACT nasal spray Place 1 spray into both nostrils at bedtime as needed for allergies.   furosemide (LASIX) 20 MG tablet Take 1 tablet (20 mg total) by mouth daily as needed (for weight gain).   Glucagon 3 MG/DOSE POWD Place 3 mg into the nose once as needed for up to 1 dose.   hydrOXYzine (ATARAX/VISTARIL) 25 MG tablet Take 1 tablet (25 mg total) by mouth 3 (three) times daily as needed. (Patient taking differently: Take 25 mg by mouth daily as needed for itching (allergies).)   insulin lispro (HUMALOG) 100 UNIT/ML injection Inject 35 units into the skin daily via pump   levothyroxine (SYNTHROID) 100 MCG tablet TAKE 1 TABLET (100 MCG TOTAL) BY MOUTH DAILY BEFORE BREAKFAST. (Patient taking differently: Take 100 mcg by mouth daily before breakfast.)   metoCLOPramide (REGLAN) 5 MG tablet Take 1 tablet (5 mg total) by mouth every 12 (twelve) hours as needed for nausea or vomiting.   metoprolol succinate (TOPROL-XL) 25 MG 24 hr tablet Take 0.5 tablets (12.5 mg total) by mouth daily. (Patient taking differently: Take 12.5 mg by mouth at bedtime.)   montelukast (SINGULAIR) 10 MG tablet Take 1 tablet (10 mg total) by mouth at bedtime.   Multiple Vitamins-Minerals (ALIVE WOMENS 50+ GUMMY) CHEW Chew 2 capsules by mouth daily.   nitroGLYCERIN (NITROSTAT) 0.4 MG SL tablet Place 1 tablet (0.4 mg total) under the tongue every 5 (five) minutes as needed for chest pain.   omeprazole (PRILOSEC OTC) 20 MG  tablet Take 20 mg by mouth daily as needed (acid reflux).   OVER THE COUNTER MEDICATION Take 3 capsules by mouth daily before breakfast. [employer] Super Lysine Immune Support   OVER THE COUNTER MEDICATION Take 25 mg by mouth at bedtime as needed (sleep/joint pain). CBD oil   prednisoLONE acetate (PRED FORTE) 1 % ophthalmic suspension Place 1 drop into affected eyes. three times a day for 1 week then  twice a day  for one week then once daily for one week   No facility-administered encounter medications on file as of 07/04/2021.    REVIEW OF SYSTEMS:  Gen:+ Fatigue. Denies fever, sweats or chills. No weight loss.  CV: Denies chest pain, palpitations or edema. Resp: + SOB. No hemoptysis.  GI: See HPI.   GU : Denies urinary burning, blood in urine, increased urinary frequency or incontinence. MS: Back pain and arthritis. Derm: Denies rash, itchiness, skin lesions or unhealing ulcers. Psych: + Anxiety and depression.  Heme: Denies bruising, easy bleeding. Neuro:  Denies headaches, dizziness or paresthesias. Endo:  + Diagnosed with diabetes type I age 60.   PHYSICAL EXAM: LMP 04/28/2021  BP 90/64   Pulse 62   Ht 5' 5" (1.651 m)   Wt 143 lb (64.9 kg)   LMP 04/28/2021   BMI 23.80 kg/m   Wt Readings from Last 3 Encounters:  07/04/21 143 lb (64.9 kg)  07/01/21 142 lb  12.8 oz (64.8 kg)  06/27/21 145 lb (65.8 kg)    General: 51 year old female with a significantly pale complexion in no acute distress. Head: Normocephalic and atraumatic. Eyes:  Sclerae non-icteric, conjunctive pink. Ears: Normal auditory acuity. Mouth: Dentition intact. No ulcers or lesions.  Neck: Supple, no lymphadenopathy or thyromegaly.  Lungs: Clear bilaterally to auscultation without wheezes, crackles or rhonchi. Heart: Regular rate and rhythm. No murmur, rub or gallop appreciated.  Abdomen: Soft, nontender, non distended. No masses. No hepatosplenomegaly. Normoactive bowel sounds x 4 quadrants.  Rectal:  Deferred. Musculoskeletal: Symmetrical with no gross deformities. Skin: Warm and dry. ?  hypopigmented vitiligo. Extremities: No edema. Neurological: Alert oriented x 4, no focal deficits.  Psychological:  Alert and cooperative. Normal mood and affect.  ASSESSMENT AND PLAN:  77) 51 year old female with a significantly complex past medical history presents to discuss colon cancer screening. -She is not a candidate for a conventional colonoscopy secondary to significant co-morbidities with numerous hospital admissions with DKA and recurrent NSTEMI. The benefits of a colonoscopy due not outweigh the risks. No overt colon mass seen on noncontrast CT 05/30/2021.  2) DM type I on insulin pump. Recurrent episodic N/V/D and epigastric pain resulting in DKA and hospital admission. Likely has diabetic gastroparesis.  -Consider gastric empty study -Continue Reglan 37m po bid for now -Unfortunately, she will likely have recurrence of N/V/D with DKA requiring hospital admission. Consider GI consult during her next hospital admission, diagnostic EGD to rule out PUD/UGI malignancy if cleared by cardiology. -Stop marijuana use -Fdgard 2 po bid -Four small snack meals daily -Follow up with endocrinologist to ensure optimal diabetes control, nutritionist consult   3) Profound coronary artery disease s/p numerous NSTEMIs, S/P multiple DES and S/P 3 vessel CABG. See HPI/hospital summary above. On Plavix.   4) Ischemic cardiomyopathy. Prior LV EF 40 - 45%. Recent ECHO LVEF 30 - 35%.  5) T. Bili 2.4. Normal Alk phos, AST/ALT levels. Likely due to vomiting. CTAP 05/2021 without liver or gallbladder abnormality.  -Consider RUQ sonogram   Await further recommendations per Dr. AHavery Moros      CC:  FGildardo Pounds NP

## 2021-07-04 ENCOUNTER — Encounter: Payer: Self-pay | Admitting: Nurse Practitioner

## 2021-07-04 ENCOUNTER — Ambulatory Visit (INDEPENDENT_AMBULATORY_CARE_PROVIDER_SITE_OTHER): Payer: Self-pay | Admitting: Nurse Practitioner

## 2021-07-04 VITALS — BP 90/64 | HR 62 | Ht 65.0 in | Wt 143.0 lb

## 2021-07-04 DIAGNOSIS — R112 Nausea with vomiting, unspecified: Secondary | ICD-10-CM

## 2021-07-04 DIAGNOSIS — E1059 Type 1 diabetes mellitus with other circulatory complications: Secondary | ICD-10-CM

## 2021-07-04 DIAGNOSIS — R1013 Epigastric pain: Secondary | ICD-10-CM

## 2021-07-04 NOTE — Patient Instructions (Signed)
Continue Reglan 5 mg by mouth twice a day as needed  Cardiac Clearance is required and discussion with Dr Havery Moros before any Endoscopy procedures are scheduled  We will be in contact with you  If you are age 53 or older, your body mass index should be between 23-30. Your Body mass index is 23.8 kg/m. If this is out of the aforementioned range listed, please consider follow up with your Primary Care Provider.  If you are age 76 or younger, your body mass index should be between 19-25. Your Body mass index is 23.8 kg/m. If this is out of the aformentioned range listed, please consider follow up with your Primary Care Provider.   ________________________________________________________  The  GI providers would like to encourage you to use [facility] to communicate with providers for non-urgent requests or questions.  Due to long hold times on the telephone, sending your provider a message by [facility] may be a faster and more efficient way to get a response.  Please allow 48 business hours for a response.  Please remember that this is for non-urgent requests.  _______________________________________________________   I appreciate the  opportunity to care for you  Thank You   Marcella Dubs

## 2021-07-05 ENCOUNTER — Telehealth: Payer: Self-pay

## 2021-07-05 ENCOUNTER — Telehealth: Payer: Self-pay | Admitting: *Deleted

## 2021-07-05 NOTE — Telephone Encounter (Signed)
Spoke with patient and scheduled her for a pre-op clearance telehealth visit for tomorrow 07/06/21 at 10:40 AM. I also made her aware that she would need to hold the plavix for 5 days prior to her procedure. She verbalized her understanding and agrees with this plan.

## 2021-07-05 NOTE — Telephone Encounter (Signed)
Primary Cardiologist:Traci Turner, MD  Chart reviewed as part of pre-operative protocol coverage. Because of Jennifer Hogan's past medical history and time since last visit, he/she will require a virtual visit/telephone call in order to better assess preoperative cardiovascular risk.  Pre-op covering staff: - Please contact patient, obtain consent, and schedule appointment   Per Dr. Radford Pax, patient may hold Plavix for 5 days prior to procedure.   Emmaline Life, NP-C    07/05/2021, 2:29 PM [place] [address], [address] Office [phone] Fax [phone]

## 2021-07-05 NOTE — Progress Notes (Signed)
Agree with assessment as outlined. Complicated case, significant cardiac comorbidities. She warrants an upper endoscopy to clear her upper GI tract, cannot make a diagnosis with gastroparesis without that, although very likely she may have a functional GI disorder.  Also, not sure if nausea vomiting could be her anginal equivalent in light of her history of diabetes and significant coronary disease when these episodes happen. Jaclyn Shaggy can you reach out to the patient's cardiologist and see if she would be cleared for an EGD.  This would need to be coordinated at the hospital, we are currently booking a few months out for this.  If she does undergo a upper endoscopy, I think it would be reasonable to do a colonoscopy at the same time in light of her bowel dysfunction and no prior colon cancer screening, if she would be able to hold her antiplatelet therapy.  If she cannot hold her antiplatelet therapy, too high risk, then would not pursue colonoscopy. Sounds like she has constipation at the onset of these episodes.  Might be reasonable to put her on some MiraLAX daily to prevent constipation, unclear if this diarrhea represents overflow.  Can you let me know if you hear back from cardiology?  Thanks

## 2021-07-05 NOTE — Telephone Encounter (Signed)
[facility] Pre-operative Risk Assessment     Jennifer Hogan [birth year] [id number]  Procedure: EGD/ possible colonoscopy Anesthesia type:  MAC Procedure Date: To be determined Provider: Dr. Havery Moros  Type of Clearance needed: Pharmacy/Cardiac/Medical  Medication(s) needing held: Plavix   Length of time for medication to be held: Please advise   Please review request and advise by either responding to this message or by sending your response to the fax # provided below.  Thank you,  [facility]  Phone: [phone] Fax: [phone] Remo Lipps RN

## 2021-07-05 NOTE — Telephone Encounter (Signed)
-----   Message from Noralyn Pick, NP sent at 07/05/2021  7:36 AM EDT -----    ----- Message ----- From: Yetta Flock, MD Sent: 07/05/2021   7:21 AM EDT To: Noralyn Pick, NP     ----- Message ----- From: Noralyn Pick, NP Sent: 07/04/2021   5:55 PM EDT To: Yetta Flock, MD  Dr. Havery Moros, complex patient should have been seen by a physician. I will await your recommendations. I appreciate your help with this extremely complicated but very nice patient.

## 2021-07-05 NOTE — Telephone Encounter (Signed)
Dr. Radford Pax, request for patient to hold Plavix for upcoming EGD/possible colonoscopy. May Jennifer Hogan hold Plavix for 5 days for upcoming procedure? Please direct your response to p cv div preop.  The preop team will complete a telephone visit with her prior to procedure to ensure no worsening angina or other concerns.  You last saw her 03/01/21 at which time you noted: ASCAD -s/p NSTEMI with cath showing severe three-vessel CAD status post PTCA DES x3 in the LAD(ostial DES placed, mid DES placed, distal DES placed) and  PTCA/DES x 1 intermediate branch. 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   Dist LAD-1 lesion is 70% stenosed.   Dist LAD-2 lesion is 70% stenosed.   Mid LAD lesion is 60% stenosed.   Ost Cx to Prox Cx lesion is 99% stenosed.   Prox Cx to Mid Cx lesion is 70% stenosed.   Ramus-1 lesion is 60% stenosed.   Ramus-2 lesion is 20% stenosed.   Ramus-3 lesion is 70% stenosed.   RPAV lesion is 60% stenosed.   Non-stenotic Mid LAD to Dist LAD lesion was previously treated.   LIMA graft was visualized by angiography and is normal in caliber.   RIMA graft was visualized by non-selective angiography and is normal in caliber.   The graft exhibits no disease.   LV end diastolic pressure is moderately elevated.   Severe 2 vessel obstructive CAD Patent sequential LIMA graft  to the ramus intermediate and distal LAD Patent RIMA to the distal LCx Moderately elevated LVEDP   Compared to September 2022 there is no significant angiographic change. Recommend continued medical therapy.   Thank you, Emmaline Life, NP-C    07/05/2021, 1:34 PM [place] [address], [address] Office  [phone] Fax [phone]

## 2021-07-05 NOTE — Telephone Encounter (Signed)
  Patient Consent for Virtual Visit        Jennifer Hogan has provided verbal consent on 07/05/2021 for a virtual visit (video or telephone).   CONSENT FOR VIRTUAL VISIT FOR:  [facility]  By participating in this virtual visit I agree to the following:  I hereby voluntarily request, consent and authorize [facility] and its employed or contracted physicians, physician assistants, nurse practitioners or other licensed health care professionals (the Practitioner), to provide me with telemedicine health care services (the "Services") as deemed necessary by the treating Practitioner. I acknowledge and consent to receive the Services by the Practitioner via telemedicine. I understand that the telemedicine visit will involve communicating with the Practitioner through live audiovisual communication technology and the disclosure of certain medical information by electronic transmission. I acknowledge that I have been given the opportunity to request an in-person assessment or other available alternative prior to the telemedicine visit and am voluntarily participating in the telemedicine visit.  I understand that I have the right to withhold or withdraw my consent to the use of telemedicine in the course of my care at any time, without affecting my right to future care or treatment, and that the Practitioner or I may terminate the telemedicine visit at any time. I understand that I have the right to inspect all information obtained and/or recorded in the course of the telemedicine visit and may receive copies of available information for a reasonable fee.  I understand that some of the potential risks of receiving the Services via telemedicine include:  Delay or interruption in medical evaluation due to technological equipment failure or disruption; Information transmitted may not be sufficient (e.g. poor resolution of images) to allow for appropriate medical decision making by the Practitioner;  and/or  In rare instances, security protocols could fail, causing a breach of personal health information.  Furthermore, I acknowledge that it is my responsibility to provide information about my medical history, conditions and care that is complete and accurate to the best of my ability. I acknowledge that Practitioner's advice, recommendations, and/or decision may be based on factors not within their control, such as incomplete or inaccurate data provided by me or distortions of diagnostic images or specimens that may result from electronic transmissions. I understand that the practice of medicine is not an exact science and that Practitioner makes no warranties or guarantees regarding treatment outcomes. I acknowledge that a copy of this consent can be made available to me via my patient portal ([facility]), or I can request a printed copy by calling the office of [facility].    I understand that my insurance will be billed for this visit.   I have read or had this consent read to me. I understand the contents of this consent, which adequately explains the benefits and risks of the Services being provided via telemedicine.  I have been provided ample opportunity to ask questions regarding this consent and the Services and have had my questions answered to my satisfaction. I give my informed consent for the services to be provided through the use of telemedicine in my medical care

## 2021-07-05 NOTE — Progress Notes (Signed)
Jennifer Hogan, pls send her cardiologist Dr. Fransico Him a request for an official cardiac clearance prior to pursing an EGD and possible colonoscopy at [facility]. Cardiac clearance to also include Plavix hold instructions.   Patient was going to search for the name of the GI who did her colonoscopy in 2017 at [facility], please contact patient and see if we can get a copy of this colonoscopy procedure and biopsy report.   Please advise patient to take Miralax 1/2 capful in 8 oz of any clear liquid daily to prevent constipation, back up of stool which may contribute to her symptoms as recommend by Dr. Havery Moros. If tolerated, she can increase to one capful QD.

## 2021-07-05 NOTE — Telephone Encounter (Signed)
Request for Cardiac Clearance and Plavix Hold Instructions sent to pt Cardiologist Dr. Fransico Him: Left message for pt to call back:

## 2021-07-06 ENCOUNTER — Other Ambulatory Visit: Payer: Self-pay | Admitting: *Deleted

## 2021-07-06 ENCOUNTER — Ambulatory Visit (INDEPENDENT_AMBULATORY_CARE_PROVIDER_SITE_OTHER): Payer: Self-pay | Admitting: Nurse Practitioner

## 2021-07-06 ENCOUNTER — Encounter: Payer: Self-pay | Admitting: Nurse Practitioner

## 2021-07-06 DIAGNOSIS — I2510 Atherosclerotic heart disease of native coronary artery without angina pectoris: Secondary | ICD-10-CM

## 2021-07-06 DIAGNOSIS — E785 Hyperlipidemia, unspecified: Secondary | ICD-10-CM

## 2021-07-06 DIAGNOSIS — Z01810 Encounter for preprocedural cardiovascular examination: Secondary | ICD-10-CM

## 2021-07-06 NOTE — Progress Notes (Signed)
Virtual Visit via Telephone Note   Because of [name]'s co-morbid illnesses, she is at least at moderate risk for complications without adequate follow up.  This format is felt to be most appropriate for this patient at this time.  The patient did not have access to video technology/had technical difficulties with video requiring transitioning to audio format only (telephone).  All issues noted in this document were discussed and addressed.  No physical exam could be performed with this format.  Please refer to the patient's chart for her consent to telehealth for [facility].  Evaluation Performed:  Preoperative cardiovascular risk assessment _____________   Date:  07/06/2021   Patient ID:  Jennifer Hogan, DOB [birth year], MRN [id number] Patient Location:  Home Provider location:   Office  Primary Care Provider:  [name], NP Primary Cardiologist:  Fransico Him, MD  Chief Complaint / Patient Profile   51 y.o. y/o female with a h/o CAD s/p PTCA DES x 3 in LAD (ostial, mid and distal) and PTCA/DES x 1 intermediate branch, s/p CABG 11/2018, cardiomyopathy, hyperlipidemia who is pending EGD/possible colonoscopy and presents today for telephonic preoperative cardiovascular risk assessment.  Past Medical History    Past Medical History:  Diagnosis Date   Anemia    Anxiety    Coronary artery disease    a. s/p PTCA DES x3 in the LAD (ostial DES placed, mid DES placed, distal DES placed) and PTCA/DES x1 to intermediate branch 02/2017. b. ACS 02/08/18 with LCx stent placement.   Depression    Diabetes mellitus    type 1, patient is not using her insulin pump.   DKA (diabetic ketoacidoses) 12/31/2017   Dyspnea    with exertion and dust, no oxygen   Elevated platelet count    GERD (gastroesophageal reflux disease)    High cholesterol    Hypothyroidism    Ischemic cardiomyopathy    a. EF low-normal with basal inferior akinesis, basal septal hypokinesis by echo  01/2018.   Myocardial infarction ([place]) 2019   Past Surgical History:  Procedure Laterality Date   APPENDECTOMY     CORONARY ARTERY BYPASS GRAFT N/A 11/04/2018   Procedure: CORONARY ARTERY BYPASS GRAFTING (CABG), ON PUMP, TIMES THREE, USING LEFT AND RIGHT INTERNAL MAMMARY ARTERIES;  Surgeon: Wonda Olds, MD;  Location: [facility];  Service: Open Heart Surgery;  Laterality: N/A;   CORONARY STENT INTERVENTION N/A 03/20/2017   Procedure: DES x 3 LAD, DES OM; Surgeon: Burnell Blanks, MD;  Location: [place] CV [facility];  Service: Cardiovascular;  Laterality: N/A;   CORONARY/GRAFT ACUTE MI REVASCULARIZATION N/A 02/08/2018   Procedure: Coronary/Graft Acute MI Revascularization;  Surgeon: Belva Crome, MD;  Location: [place] CV [facility];  Service: Cardiovascular;  Laterality: N/A;   GAS INSERTION Right 04/28/2021   Procedure: INSERTION OF GAS ( C3F8);  Surgeon: Jalene Mullet, MD;  Location: [facility];  Service: Ophthalmology;  Laterality: Right;  Right eye   GAS/FLUID EXCHANGE Right 04/28/2021   Procedure: GAS/FLUID EXCHANGE;  Surgeon: Jalene Mullet, MD;  Location: [facility];  Service: Ophthalmology;  Laterality: Right;  Right eye   LEFT HEART CATH AND CORONARY ANGIOGRAPHY N/A 03/20/2017   Procedure: LEFT HEART CATH AND CORONARY ANGIOGRAPHY;  Surgeon: Burnell Blanks, MD;  Location: [place] [facility];  Service: Cardiovascular;  Laterality: N/A;   LEFT HEART CATH AND CORONARY ANGIOGRAPHY N/A 02/08/2018   Procedure: LEFT HEART CATH AND CORONARY ANGIOGRAPHY;  Surgeon: Belva Crome, MD;  Location: [facility]  INVASIVE CV LAB;  Service: Cardiovascular;  Laterality: N/A;   LEFT HEART CATH AND CORONARY ANGIOGRAPHY N/A 10/29/2018   Procedure: LEFT HEART CATH AND CORONARY ANGIOGRAPHY;  Surgeon: Burnell Blanks, MD;  Location: [place] CV [facility];  Service: Cardiovascular;  Laterality: N/A;   LEFT HEART CATH AND CORS/GRAFTS ANGIOGRAPHY N/A 10/04/2020   Procedure: LEFT HEART CATH AND CORS/GRAFTS ANGIOGRAPHY;   Surgeon: Sherren Mocha, MD;  Location: [place] [facility];  Service: Cardiovascular;  Laterality: N/A;   LEFT HEART CATH AND CORS/GRAFTS ANGIOGRAPHY N/A 05/11/2021   Procedure: LEFT HEART CATH AND CORS/GRAFTS ANGIOGRAPHY;  Surgeon: Martinique, Peter M, MD;  Location: [place] CV [facility];  Service: Cardiovascular;  Laterality: N/A;   PARS PLANA VITRECTOMY Right 04/28/2021   Procedure: PARS PLANA VITRECTOMY WITH 25 GAUGE REMOVAL OF TRATIONAL MEMBRANE RIGHT EYE; DRAINAGE OF SUBRETINAL FLUID RIGHT EYE;  Surgeon: Jalene Mullet, MD;  Location: [facility];  Service: Ophthalmology;  Laterality: Right;  Right eye   PHOTOCOAGULATION WITH LASER Right 04/28/2021   Procedure: PHOTOCOAGULATION WITH LASER;  Surgeon: Jalene Mullet, MD;  Location: [facility];  Service: Ophthalmology;  Laterality: Right;  Right eye   TEE WITHOUT CARDIOVERSION N/A 11/04/2018   Procedure: TRANSESOPHAGEAL ECHOCARDIOGRAM (TEE);  Surgeon: Wonda Olds, MD;  Location: [facility];  Service: Open Heart Surgery;  Laterality: N/A;    Allergies  Allergies  Allergen Reactions   Atorvastatin Other (See Comments)    Myalgias on '80mg'$  dose, tolerates '20mg'$  ok   Crestor [Rosuvastatin Calcium] Other (See Comments)    Severe myalgias and joint pain. Has tolerated atorvastatin though   Monosodium Glutamate Nausea And Vomiting and Other (See Comments)    migraine   Zetia [Ezetimibe] Other (See Comments)    myalgias   Ciprofloxin Hcl [Ciprofloxacin] Nausea And Vomiting and Other (See Comments)    Severe migraine   Food Color Red [Red Dye] Diarrhea    History of Present Illness    Jennifer Hogan is a 51 y.o. female who presents via audio/video conferencing for a telehealth visit today.  Pt was last seen in cardiology clinic on 03/01/2021 by Dr. Radford Pax.  At that time [name] was doing well. She underwent left heart catheterization on 05/11/21 that revealed stable CAD and patent grafts. The patient is now pending procedure as outlined above. Since  her last visit, she  denies chest pain, shortness of breath, lower extremity edema, fatigue, palpitations, melena, hematuria, hemoptysis, diaphoresis, weakness, presyncope, syncope, orthopnea, and PND. 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   Home Medications    Prior to Admission medications   Medication Sig Start Date End Date Taking? Authorizing Provider  acetaminophen (TYLENOL) 325 MG tablet Take 325-650 mg by mouth daily as needed for mild pain, fever or headache.    [provider]  albuterol (VENTOLIN HFA) 108 (90 Base) MCG/ACT inhaler INHALE 2 PUFFS INTO THE LUNGS EVERY 6 (SIX) HOURS AS NEEDED FOR WHEEZING OR SHORTNESS OF BREATH. Patient taking differently: Inhale 1-2 puffs into the lungs every 6 (six) hours as needed for wheezing or shortness of breath. 12/01/20 12/01/21  Charlott Rakes, MD  aspirin 81 MG EC tablet Take 1 tablet (81 mg total) by mouth daily. Patient taking differently: Take 81 mg by mouth at bedtime. 12/07/17   Amin, Jeanella Flattery, MD  atorvastatin (LIPITOR) 20 MG tablet Take 1 tablet (20 mg total) by mouth daily. 10/07/20   Swayze, Ava, DO  b complex vitamins tablet Take 1  tablet by mouth daily.    [provider]  brimonidine (ALPHAGAN) 0.15 % ophthalmic solution Place 1 drop into the right eye 2 (two) times daily. 04/29/21   Jalene Mullet, MD  budesonide (PULMICORT) 0.25 MG/2ML nebulizer solution Take 0.25 mg by nebulization as needed (shortness of breath/wheezing).    [provider]  clopidogrel (PLAVIX) 75 MG tablet TAKE 1 TABLET BY MOUTH DAILY WITH BREAKFAST. Patient taking differently: Take 75 mg by mouth at bedtime. 11/01/20   [name], MD  diclofenac Sodium (VOLTAREN) 1 % GEL Apply 2-4 g topically as needed (knee pain).    [provider]  diphenhydrAMINE (BENADRYL) 25 MG tablet Take 25 mg by mouth at bedtime.    [provider]  FLUoxetine (PROZAC) 40 MG capsule TAKE 1 CAPSULE BY  MOUTH DAILY. Patient taking differently: Take 40 mg by mouth daily. 05/24/21 05/24/22  Charlott Rakes, MD  fluticasone (FLONASE) 50 MCG/ACT nasal spray Place 1 spray into both nostrils at bedtime as needed for allergies.    [provider]  furosemide (LASIX) 20 MG tablet Take 1 tablet (20 mg total) by mouth daily as needed (for weight gain). 10/14/20   Clegg, Amy D, NP  Glucagon 3 MG/DOSE POWD Place 3 mg into the nose once as needed for up to 1 dose. 07/01/21   Philemon Kingdom, MD  hydrOXYzine (ATARAX/VISTARIL) 25 MG tablet Take 1 tablet (25 mg total) by mouth 3 (three) times daily as needed. Patient taking differently: Take 25 mg by mouth daily as needed for itching (allergies). 03/19/19   [name], NP  insulin lispro (HUMALOG) 100 UNIT/ML injection Inject 35 units into the skin daily via pump 04/30/21 04/30/22  Renato Shin, MD  levothyroxine (SYNTHROID) 100 MCG tablet TAKE 1 TABLET (100 MCG TOTAL) BY MOUTH DAILY BEFORE BREAKFAST. Patient taking differently: Take 100 mcg by mouth daily before breakfast. 12/01/20 12/01/21  Renato Shin, MD  metoCLOPramide (REGLAN) 5 MG tablet Take 1 tablet (5 mg total) by mouth every 12 (twelve) hours as needed for nausea or vomiting. 06/27/21 06/27/22  [name], NP  metoprolol succinate (TOPROL-XL) 25 MG 24 hr tablet Take 0.5 tablets (12.5 mg total) by mouth daily. Patient taking differently: Take 12.5 mg by mouth at bedtime. 03/07/21   Bensimhon, Shaune Pascal, MD  montelukast (SINGULAIR) 10 MG tablet Take 1 tablet (10 mg total) by mouth at bedtime. 01/13/21   [name], NP  Multiple Vitamins-Minerals (ALIVE WOMENS 50+ GUMMY) CHEW Chew 2 capsules by mouth daily.    [provider]  nitroGLYCERIN (NITROSTAT) 0.4 MG SL tablet Place 1 tablet (0.4 mg total) under the tongue every 5 (five) minutes as needed for chest pain. 10/14/20   Clegg, Amy D, NP  omeprazole (PRILOSEC OTC) 20 MG tablet Take 20 mg by mouth daily as needed (acid reflux).     [provider]  OVER THE COUNTER MEDICATION Take 3 capsules by mouth daily before breakfast. [employer] Super Lysine Immune Support    [provider]  OVER THE COUNTER MEDICATION Take 25 mg by mouth at bedtime as needed (sleep/joint pain). CBD oil    [provider]  prednisoLONE acetate (PRED FORTE) 1 % ophthalmic suspension Place 1 drop into affected eyes. three times a day for 1 week then  twice a day  for one week then once daily for one week 04/29/21   Jalene Mullet, MD    Physical Exam    Vital Signs:  Kayren Eaves  [place] does not have vital signs available for review today.  Given telephonic nature of communication, physical exam is limited. AAOx3. NAD. Normal affect.  Speech and respirations are unlabored.  Accessory Clinical Findings    None  Assessment & Plan    1.  Preoperative Cardiovascular Risk Assessment: She is doing well from a cardiac perspective and may proceed without further testing. According to the Revised Cardiac Risk Index (RCRI), her Perioperative Risk of Major Cardiac Event is (%): 6.6. Her Functional Capacity in METs is: 6.05 according to the [employer] Activity Status Index (DASI).  Per Dr. Radford Pax, patient may hold Plavix for 5 days prior to procedure.    A copy of this note will be routed to requesting surgeon.  Time:   Today, I have spent 10 minutes with the patient with telehealth technology discussing medical history, symptoms, and management plan.     Emmaline Life, NP-C    07/06/2021, 10:48 AM [facility] [address] Office [phone] Fax [phone]

## 2021-07-07 ENCOUNTER — Telehealth: Payer: Self-pay

## 2021-07-07 NOTE — Telephone Encounter (Signed)
-----   Message from Noralyn Pick, NP sent at 07/05/2021  7:36 AM EDT -----    ----- Message ----- From: Yetta Flock, MD Sent: 07/05/2021   7:21 AM EDT To: Noralyn Pick, NP     ----- Message ----- From: Noralyn Pick, NP Sent: 07/04/2021   5:55 PM EDT To: Yetta Flock, MD  Dr. Havery Moros, complex patient should have been seen by a physician. I will await your recommendations. I appreciate your help with this extremely complicated but very nice patient.

## 2021-07-07 NOTE — Telephone Encounter (Signed)
Request sent to pt cardiologist Dr. Fransico Him  for an official cardiac clearance prior to pursing an EGD and possible colonoscopy at [facility]. Cardiac clearance to also include Plavix hold instructions. Pt stated that Cardiologist has reached out to pt: Documentation in Epic  Pt was questioned about  pt searching  for the name of the GI who did her colonoscopy in 2017 at [facility], Pt stated that she is working on this and will call our office back when received  Pt was advised  to take Miralax 1/2 capful in 8 oz of any clear liquid daily to prevent constipation, back up of stool which may contribute to her symptoms as recommend by Dr. Havery Moros. If tolerated, she can increase to one capful QD: Pt verbalized understanding with all questions answered.

## 2021-07-11 ENCOUNTER — Telehealth: Payer: Self-pay | Admitting: Nurse Practitioner

## 2021-07-11 NOTE — Telephone Encounter (Signed)
Jennifer Hogan, refer to office visit 07/04/2021.  Cardiac clearance including Plavix hold instructions for 5 days was received by cardiology 07/06/2021 which was reviewed by Dr. Havery Moros.   Dr. Havery Moros ordered an EGD and colonoscopy at [facility] and he placed patient on the wait list.  I am contacting both of you since you are on Dr. Doyne Keel scheduling team.   I called the patient today and informed her that Dr. Havery Moros is booked 3 months in advance therefore she will be placed on a waiting list.  I asked the patient to contact our office if she gets admitted to the hospital for any diabetes or cardiac issue prior to her egd/colonoscopy date.  She will contact our office if her GI symptoms worsen.

## 2021-07-11 NOTE — Telephone Encounter (Signed)
Got it, thanks. I have added her to the list. [facility] let's chat about our wait list when you get back in town, to see if any spots open in August to accommodate this patient. Thanks

## 2021-07-12 ENCOUNTER — Encounter: Payer: Self-pay | Admitting: Cardiology

## 2021-07-12 NOTE — Telephone Encounter (Signed)
Noted  

## 2021-07-18 ENCOUNTER — Other Ambulatory Visit: Payer: Medicaid Other

## 2021-07-19 ENCOUNTER — Other Ambulatory Visit: Payer: Medicaid Other

## 2021-07-28 ENCOUNTER — Ambulatory Visit: Payer: Medicaid Other

## 2021-07-28 NOTE — Progress Notes (Deleted)
[name]                 DOB: 1970/09/01                    MRN: [id number]     HPI: Jennifer Hogan is a 51 y.o. female patient referred to lipid clinic by Dr. Radford Pax. PMH is significant for type 1 DM on chronic insulin pump, hypothyroidism, HLD, depression, and ischemic dilated cardiomyopathy EF 45-50% with apical hypokinesis.  She has severe 3 vessel ASCAD s/p NSTEMI with cath showing severe three-vessel CAD status post PTCA DES x3 in the LAD (ostial DES placed, mid DES placed, distal DES placed) and PTCA/DES x 1 intermediate branch. She was placed on DAPT w/ ASA and Brilinta, along with high dose statin therapy, Lipitor 80 mg, (LDL elevated at 126 mg/dL). In January 2020, pt presented with STEMI and underwent stenting of ostial to proximal RCA. She had residual moderate disease in the mid LAD between stents and a ostial ramus branch and was treated medically. She was readmitted 10/29/2018 with MI and underwent cath showing significant stenosis in RI and 60% mLAD. She ultimately underwent CABG with LIMA>LAD and RI, RIMA>OM.    Patient was last seen in lipid clinic in Jan 2021. We eventually were able to get her patient assistance for Praluent in July of 2022. Dose was increased to Praluent '150mg'$  in Nov 2021. She was supposed to follow up with lipid clinic in July 2022 after being out of Praluent for 2 months, but no apt was made.     Current Medications:  Intolerances: rosuvastatin 40 mg (myalgias and joint pain), atorvastatin 80 mg once daily, Zetia 10 mg once daily - myalgias on both Risk Factors: CAD s/p multiple MIs, PCIs, and CABG, DM, family history LDL goal: < 55 mg/dL  Diet: Cooks with and eats a lot of butter. Eats mainly fish, but has steak occasionally. Fruits, frozen vegetables, riced brocilli or cauiliflower. Limited foods due to stomach sensitivity.    Exercise: Walks around 2 acres of land most days of the week.  Family History: The patient's family history  includes Asthma in her father; Cancer and high cholesterol in her mother; Congestive Heart Failure and heart disease on pacemaker in her father; Coronary artery disease in her father. Grandmother had congestive heart disease  Social History: marijuana use   Lipid panel:  05/11/21- TC 258, TG 64, HDL 78, LDL-C 167  01/08/19- TC 173, TG 91, HDL 93, LDL 103 (atorvastatin 80 mg - adherence issues) 07/23/18: TC 215, TG 157, HDL 63, LDL 121 (no medications)  Past Medical History:  Diagnosis Date   Anemia    Anxiety    Coronary artery disease    a. s/p PTCA DES x3 in the LAD (ostial DES placed, mid DES placed, distal DES placed) and PTCA/DES x1 to intermediate branch 02/2017. b. ACS 02/08/18 with LCx stent placement.   Depression    Diabetes mellitus    type 1, patient is not using her insulin pump.   DKA (diabetic ketoacidoses) 12/31/2017   Dyspnea    with exertion and dust, no oxygen   Elevated platelet count    GERD (gastroesophageal reflux disease)    High cholesterol    Hypothyroidism    Ischemic cardiomyopathy    a. EF low-normal with basal inferior akinesis, basal septal hypokinesis by echo 01/2018.   Myocardial infarction [facility]) 2019    Current Outpatient Medications on File Prior  to Visit  Medication Sig Dispense Refill   acetaminophen (TYLENOL) 325 MG tablet Take 325-650 mg by mouth daily as needed for mild pain, fever or headache.     albuterol (VENTOLIN HFA) 108 (90 Base) MCG/ACT inhaler INHALE 2 PUFFS INTO THE LUNGS EVERY 6 (SIX) HOURS AS NEEDED FOR WHEEZING OR SHORTNESS OF BREATH. (Patient taking differently: Inhale 1-2 puffs into the lungs every 6 (six) hours as needed for wheezing or shortness of breath.) 8.5 g 2   aspirin 81 MG EC tablet Take 1 tablet (81 mg total) by mouth daily. (Patient taking differently: Take 81 mg by mouth at bedtime.) 90 tablet 0   atorvastatin (LIPITOR) 20 MG tablet Take 1 tablet (20 mg total) by mouth daily. 30 tablet 0   b complex vitamins tablet  Take 1 tablet by mouth daily.     brimonidine (ALPHAGAN) 0.15 % ophthalmic solution Place 1 drop into the right eye 2 (two) times daily. 5 mL 0   budesonide (PULMICORT) 0.25 MG/2ML nebulizer solution Take 0.25 mg by nebulization as needed (shortness of breath/wheezing).     clopidogrel (PLAVIX) 75 MG tablet TAKE 1 TABLET BY MOUTH DAILY WITH BREAKFAST. (Patient taking differently: Take 75 mg by mouth at bedtime.) 90 tablet 3   diclofenac Sodium (VOLTAREN) 1 % GEL Apply 2-4 g topically as needed (knee pain).     diphenhydrAMINE (BENADRYL) 25 MG tablet Take 25 mg by mouth at bedtime.     FLUoxetine (PROZAC) 40 MG capsule TAKE 1 CAPSULE BY MOUTH DAILY. (Patient taking differently: Take 40 mg by mouth daily.) 90 capsule 0   fluticasone (FLONASE) 50 MCG/ACT nasal spray Place 1 spray into both nostrils at bedtime as needed for allergies.     furosemide (LASIX) 20 MG tablet Take 1 tablet (20 mg total) by mouth daily as needed (for weight gain). 15 tablet 3   Glucagon 3 MG/DOSE POWD Place 3 mg into the nose once as needed for up to 1 dose. 1 each 11   hydrOXYzine (ATARAX/VISTARIL) 25 MG tablet Take 1 tablet (25 mg total) by mouth 3 (three) times daily as needed. (Patient taking differently: Take 25 mg by mouth daily as needed for itching (allergies).) 60 tablet 1   insulin lispro (HUMALOG) 100 UNIT/ML injection Inject 35 units into the skin daily via pump 10 mL 1   levothyroxine (SYNTHROID) 100 MCG tablet TAKE 1 TABLET (100 MCG TOTAL) BY MOUTH DAILY BEFORE BREAKFAST. (Patient taking differently: Take 100 mcg by mouth daily before breakfast.) 90 tablet 3   metoCLOPramide (REGLAN) 5 MG tablet Take 1 tablet (5 mg total) by mouth every 12 (twelve) hours as needed for nausea or vomiting. 60 tablet 3   metoprolol succinate (TOPROL-XL) 25 MG 24 hr tablet Take 0.5 tablets (12.5 mg total) by mouth daily. (Patient taking differently: Take 12.5 mg by mouth at bedtime.) 15 tablet 6   montelukast (SINGULAIR) 10 MG tablet  Take 1 tablet (10 mg total) by mouth at bedtime. 30 tablet 4   Multiple Vitamins-Minerals (ALIVE WOMENS 50+ GUMMY) CHEW Chew 2 capsules by mouth daily.     nitroGLYCERIN (NITROSTAT) 0.4 MG SL tablet Place 1 tablet (0.4 mg total) under the tongue every 5 (five) minutes as needed for chest pain. 25 tablet 6   omeprazole (PRILOSEC OTC) 20 MG tablet Take 20 mg by mouth daily as needed (acid reflux).     OVER THE COUNTER MEDICATION Take 3 capsules by mouth daily before breakfast. [employer] Super Lysine Immune Support  OVER THE COUNTER MEDICATION Take 25 mg by mouth at bedtime as needed (sleep/joint pain). CBD oil     prednisoLONE acetate (PRED FORTE) 1 % ophthalmic suspension Place 1 drop into affected eyes. three times a day for 1 week then  twice a day  for one week then once daily for one week 10 mL 0   No current facility-administered medications on file prior to visit.    Allergies  Allergen Reactions   Atorvastatin Other (See Comments)    Myalgias on '80mg'$  dose, tolerates '20mg'$  ok   Crestor [Rosuvastatin Calcium] Other (See Comments)    Severe myalgias and joint pain. Has tolerated atorvastatin though   Monosodium Glutamate Nausea And Vomiting and Other (See Comments)    migraine   Zetia [Ezetimibe] Other (See Comments)    myalgias   Ciprofloxin Hcl [Ciprofloxacin] Nausea And Vomiting and Other (See Comments)    Severe migraine   Food Color Red [Red Dye] Diarrhea    Assessment/Plan:  1. Hyperlipidemia - LDL above goal of < 55 mg/dL due to extensive ASCVD history and family history. Will stop atorvastatin 80 mg and ezetimibe 10 mg due to intolerable myalgias. Will start lower atorvastatin 20 mg once daily. The prescription was sent to [facility]. Discussed PCSK9i therapy including mechanism of action, expected benefits, side effects, and injection technique. Will apply for patient assistance since pt is uninsured and once approved will start Praluent 75 mg every  14 days. We will contact patient about approval and schedule follow-up at that time. Encouraged medication compliance, decrease use of butter, and continue exercise to reduce the risk of another CV event.  Julieta Bellini, PharmD Candidate  Megan E. Supple, PharmD, BCACP, [place] [address], [place], [place] [address] Phone: [phone]; Fax: [phone] 07/28/2021 8:35 AM

## 2021-08-04 ENCOUNTER — Other Ambulatory Visit: Payer: Self-pay

## 2021-08-04 ENCOUNTER — Telehealth: Payer: Self-pay

## 2021-08-04 DIAGNOSIS — Z1211 Encounter for screening for malignant neoplasm of colon: Secondary | ICD-10-CM

## 2021-08-04 DIAGNOSIS — R1013 Epigastric pain: Secondary | ICD-10-CM

## 2021-08-04 DIAGNOSIS — E1059 Type 1 diabetes mellitus with other circulatory complications: Secondary | ICD-10-CM

## 2021-08-04 DIAGNOSIS — R112 Nausea with vomiting, unspecified: Secondary | ICD-10-CM

## 2021-08-04 NOTE — Telephone Encounter (Signed)
Patient called back and is available to have her procedure on thurs, 9-7 at [facility]. She will be scheduled for 12:45 pm to arrive at 11:15am. Patient will need PV closer to procedure, week of August 21

## 2021-08-04 NOTE — Progress Notes (Signed)
ECL at [facility] with Dr. Havery Moros on 09-29-21

## 2021-08-04 NOTE — Telephone Encounter (Signed)
Called and Left detailed message for patient that Dr. Havery Moros has an opening for ECL at [facility] on Thursday, 9-7. Asked her to call back asap to confirm or decline appointment. (Patient saw Jaclyn Shaggy int he office on 07-04-21).

## 2021-08-08 ENCOUNTER — Telehealth: Payer: Self-pay

## 2021-08-08 NOTE — Telephone Encounter (Signed)
Called and spoke to [place] at the [facility]. Scheduled patient for 12:45 pm on 9-7. To arrive at 11:15am. Called and left detailed message for patient. She will need a PV around the week of August 21. Asked her to call back to be scheduled. Note: Patient has already been cleared to hold Plavix for 5 days byt Dr. Fransico Him.

## 2021-08-08 NOTE — Telephone Encounter (Signed)
-----   Message from Roetta Sessions, [place] sent at 08/04/2021  4:55 PM EDT ----- Regarding: call endo unit to schedule ECL case Call endo unit at [phone] to add case on 9-7 at 12:45.   Schedule PV for  week of August 21st.  Call patient with dates and times

## 2021-08-09 ENCOUNTER — Telehealth: Payer: Self-pay

## 2021-08-09 NOTE — Telephone Encounter (Signed)
Inbound fax from DME supplier requesting form be completed and faxed with clinical notes. DME supplies ordered via [employer] through online portal.  

## 2021-08-10 ENCOUNTER — Other Ambulatory Visit: Payer: Self-pay | Admitting: Nurse Practitioner

## 2021-08-10 DIAGNOSIS — J9801 Acute bronchospasm: Secondary | ICD-10-CM

## 2021-08-10 NOTE — Progress Notes (Signed)
Defer to the patient's PCP

## 2021-08-11 ENCOUNTER — Encounter: Payer: Self-pay | Admitting: Internal Medicine

## 2021-08-11 ENCOUNTER — Other Ambulatory Visit: Payer: Self-pay

## 2021-08-11 MED ORDER — MONTELUKAST SODIUM 10 MG PO TABS
10.0000 mg | ORAL_TABLET | Freq: Every day | ORAL | 2 refills | Status: DC
  Filled 2021-08-11: qty 90, 90d supply, fill #0
  Filled 2021-11-08: qty 90, 90d supply, fill #1
  Filled 2021-12-03 – 2022-02-06 (×2): qty 90, 90d supply, fill #2

## 2021-08-11 NOTE — Telephone Encounter (Signed)
Requested Prescriptions  Pending Prescriptions Disp Refills  . montelukast (SINGULAIR) 10 MG tablet 90 tablet 2    Sig: Take 1 tablet (10 mg total) by mouth at bedtime.     Pulmonology:  Leukotriene Inhibitors Passed - 08/10/2021  9:49 PM      Passed - Valid encounter within last 12 months    Recent Outpatient Visits          1 month ago Hospital discharge follow-up   [place], [place] W, NP   4 months ago Type 1 diabetes mellitus with other circulatory complication [facility])   [place] [name], NP   9 months ago Flu-like symptoms   [place], Vernia Buff, NP   1 year ago Chronic fatigue   [place], Vernia Buff, NP   2 years ago Cough in adult patient   [place], Vernia Buff, NP      Future Appointments            In 2 weeks  [place], [place]   In 3 weeks [place], Lanice Schwab, NP [place], [place]   In 1 month [place], Vernia Buff, NP [place]

## 2021-08-12 NOTE — Telephone Encounter (Signed)
Called and left message for patient with information from [place] message re: procedure scheduled for 9-7, and that she needs to call the office and get scheduled for a nurse Pre visit to got through instructions for her procedure around the week of August 21st.

## 2021-08-15 ENCOUNTER — Other Ambulatory Visit: Payer: Self-pay

## 2021-08-15 ENCOUNTER — Other Ambulatory Visit: Payer: Medicaid Other

## 2021-08-15 ENCOUNTER — Telehealth: Payer: Self-pay | Admitting: *Deleted

## 2021-08-15 DIAGNOSIS — E785 Hyperlipidemia, unspecified: Secondary | ICD-10-CM

## 2021-08-15 DIAGNOSIS — I2510 Atherosclerotic heart disease of native coronary artery without angina pectoris: Secondary | ICD-10-CM

## 2021-08-15 LAB — COMPREHENSIVE METABOLIC PANEL
ALT: 27 IU/L (ref 0–32)
AST: 32 IU/L (ref 0–40)
Albumin/Globulin Ratio: 1.5 (ref 1.2–2.2)
Albumin: 4.3 g/dL (ref 3.8–4.9)
Alkaline Phosphatase: 108 IU/L (ref 44–121)
BUN/Creatinine Ratio: 12 (ref 9–23)
BUN: 10 mg/dL (ref 6–24)
Bilirubin Total: <0.2 mg/dL (ref 0.0–1.2)
CO2: 22 mmol/L (ref 20–29)
Calcium: 9.2 mg/dL (ref 8.7–10.2)
Chloride: 101 mmol/L (ref 96–106)
Creatinine, Ser: 0.84 mg/dL (ref 0.57–1.00)
Globulin, Total: 2.8 g/dL (ref 1.5–4.5)
Glucose: 121 mg/dL — ABNORMAL HIGH (ref 70–99)
Potassium: 4.3 mmol/L (ref 3.5–5.2)
Sodium: 136 mmol/L (ref 134–144)
Total Protein: 7.1 g/dL (ref 6.0–8.5)
eGFR: 84 mL/min/{1.73_m2} (ref >59)

## 2021-08-15 LAB — LIPID PANEL
Chol/HDL Ratio: 3.9 ratio (ref 0.0–4.4)
Cholesterol, Total: 298 mg/dL — ABNORMAL HIGH (ref 100–199)
HDL: 76 mg/dL (ref >39)
LDL Chol Calc (NIH): 203 mg/dL — ABNORMAL HIGH (ref 0–99)
Triglycerides: 111 mg/dL (ref 0–149)
VLDL Cholesterol Cal: 19 mg/dL (ref 5–40)

## 2021-08-15 NOTE — Telephone Encounter (Signed)
Jennifer Hogan 1970/11/10 [id number]  Dear Dr. Cruzita Lederer   Dr. Havery Moros has scheduled the above individual for a(n) EGD/Colonoscopy at [facility] on 09-29-21.  Our records show that this patient is on insulin therapy via an insulin pump.  Our colonoscopy prep protocol requires that:   the patient must be on a clear liquid diet the entire day prior to the procedure date as well as the morning of the procedure  the patient must be NPO for 4 hours prior to the procedure   the patient must consume a PEG 3350 solution to prepare for the procedure.  Please advise [name] of any adjustments that need to be made to the patient's insulin pump therapy prior to the above procedure date.    Please route  your response to Lemar Lofty, CMA or fax your response to [phone], attn Jan.  If you have any questions, please call me at [phone].  Thank you for your help with this matter.  Sincerely, Tia Alert, CMA

## 2021-08-15 NOTE — Telephone Encounter (Signed)
This patient is scheduled for ECL at [facility] on 9/7 with Dr.Armbruster. patient has INSULIN PUMP please obtain insulin pump instructions. Thank you, Marletta Bousquet PV

## 2021-08-23 ENCOUNTER — Ambulatory Visit: Payer: Medicaid Other

## 2021-08-24 ENCOUNTER — Ambulatory Visit (INDEPENDENT_AMBULATORY_CARE_PROVIDER_SITE_OTHER): Payer: Medicaid Other | Admitting: Licensed Clinical Social Worker

## 2021-08-24 DIAGNOSIS — F332 Major depressive disorder, recurrent severe without psychotic features: Secondary | ICD-10-CM

## 2021-08-24 DIAGNOSIS — F411 Generalized anxiety disorder: Secondary | ICD-10-CM | POA: Insufficient documentation
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
## 2021-08-25 ENCOUNTER — Ambulatory Visit: Payer: Medicaid Other | Admitting: Nurse Practitioner

## 2021-08-25 ENCOUNTER — Encounter (HOSPITAL_COMMUNITY): Payer: Self-pay

## 2021-08-25 ENCOUNTER — Ambulatory Visit (INDEPENDENT_AMBULATORY_CARE_PROVIDER_SITE_OTHER): Payer: Medicaid Other | Admitting: Licensed Clinical Social Worker

## 2021-08-25 ENCOUNTER — Other Ambulatory Visit: Payer: Self-pay

## 2021-08-25 DIAGNOSIS — F411 Generalized anxiety disorder: Secondary | ICD-10-CM

## 2021-08-25 DIAGNOSIS — F332 Major depressive disorder, recurrent severe without psychotic features: Secondary | ICD-10-CM

## 2021-08-25 DIAGNOSIS — F331 Major depressive disorder, recurrent, moderate: Secondary | ICD-10-CM

## 2021-08-25 MED ORDER — FLUOXETINE HCL 20 MG PO CAPS
20.0000 mg | ORAL_CAPSULE | Freq: Every day | ORAL | 0 refills | Status: DC
  Filled 2021-08-25: qty 4, 4d supply, fill #0

## 2021-08-25 MED ORDER — FLUOXETINE HCL 20 MG PO CAPS
20.0000 mg | ORAL_CAPSULE | Freq: Every day | ORAL | 0 refills | Status: DC
  Filled 2021-08-25: qty 15, 15d supply, fill #0

## 2021-08-25 MED ORDER — BUPROPION HCL ER (XL) 150 MG PO TB24
150.0000 mg | ORAL_TABLET | ORAL | 0 refills | Status: DC
  Filled 2021-08-25: qty 30, 30d supply, fill #0
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
## 2021-08-26 ENCOUNTER — Ambulatory Visit (HOSPITAL_COMMUNITY): Payer: Self-pay

## 2021-08-28 NOTE — Progress Notes (Deleted)
[name]                 DOB: 11/08/1970                    MRN: [id number]        HPI: [name] is a 51 y.o. female patient referred to lipid clinic by Dr. Radford Pax. PMH is significant for type 1 DM on chronic insulin pump, hypothyroidism, HLD, depression, and ischemic dilated cardiomyopathy EF 45-50% with apical hypokinesis.  She has severe 3 vessel ASCAD s/p NSTEMI with cath showing severe three-vessel CAD status post PTCA DES x3 in the LAD (ostial DES placed, mid DES placed, distal DES placed) and PTCA/DES x 1 intermediate branch. She was placed on DAPT w/ ASA and Brilinta, along with high dose statin therapy, Lipitor 80 mg, (LDL elevated at 126 mg/dL). In January 2020, pt presented with STEMI and underwent stenting of ostial to proximal RCA. She had residual moderate disease in the mid LAD between stents and a ostial ramus branch and was treated medically. She was readmitted 10/29/2018 with MI and underwent cath showing significant stenosis in RI and 60% mLAD. She ultimately underwent CABG with LIMA>LAD and RI, RIMA>OM.     Lipid panel on 01/08/19 showed LDL 103 mg/dL on atorvastatin 80 mg, so Zetia 10 mg daily was added. On 02/13/19, the patient left a MyChart message reporting worsening myalgias after starting Zetia 10 mg. She was referred to lipid clinic by Dr. Radford Pax where atorvastatin was lowered to 28mdaily, zetia was stopped and she was eventually started on Praluent through patient assistance (08/04/19). Labs 12/22/19 showed an LDL-C of 114. She was just on Praluent. Had stopped atorvastatin due to muscle pains. Praluent was increased to '150mg'$  q 14 days. LDL-C in July 2022 was 166. She has been without her Praluent for 2 months. Attempted to schedule follow up visit, but does not appear one was ever made.    Patient arrives at lipid clinic for medication management. 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 She reports tolerating atorvastatin 20 mg in the past, but once the dose was increased to 80 mg, the myalgias were intolerable and made it difficult for her to complete daily tasks. Reports the myalgias worsened when she started taking Zetia 10 mg daily. The patient reports using CBD/THC topical cream for relief of pain in her legs. The patient also mentions she is taking EPA supplements.    The patient adheres to her type 1 diabetes diet. She limits sodium and carbs but cooks and eats butter. She stays active by walking a lot on her 2 [place] land. She has set goals to run a mile and add strength training to exercise, but she experiences shortness of breath some days. The patient states that she is unemployed and is still trying to get Medicaid and disability insurance. However, she confirms that the price she pays for atorvastatin is currently affordable and she receives her insulin from [facility]   Current Medications: atorvastatin 80 mg once daily, Zetia 10 mg once daily - myalgias on both Intolerances: rosuvastatin 40 mg (myalgias and joint pain) Risk Factors: CAD s/p multiple MIs, PCIs, and CABG, DM, family history LDL goal: < 55 mg/dL   Diet: Cooks with and eats a lot of butter. Eats mainly fish, but has steak occasionally. Fruits, frozen vegetables, riced  brocilli or cauiliflower. Limited foods due to stomach sensitivity.     Exercise: Walks around 2 acres of land most days of the week.   Family History: The patient's family history includes Asthma in her father; Cancer and high cholesterol in her mother; Congestive Heart Failure and heart disease on pacemaker in her father; Coronary artery disease in her father. Grandmother had congestive heart disease   Social History: marijuana use    Lipid panel:  01/08/19- TC 173, TG 91, HDL 93, LDL 103 (atorvastatin 80 mg - adherence issues) 07/23/18: TC 215, TG 157, HDL 63, LDL 121 (no medications)        Past Medical History:  Diagnosis Date   Anemia     Anxiety     Coronary artery disease      a. s/p PTCA DES x3 in the LAD (ostial DES placed, mid DES placed, distal DES placed) and PTCA/DES x1 to intermediate branch 02/2017. b. ACS 02/08/18 with LCx stent placement.   Depression     Diabetes mellitus      type 1, on insulin pump   DKA (diabetic ketoacidoses) ([place]) 12/31/2017   Elevated platelet count     High cholesterol     Hypothyroidism     Ischemic cardiomyopathy      a. EF low-normal with basal inferior akinesis, basal septal hypokinesis by echo 01/2018.            Current Outpatient Medications on File Prior to Visit  Medication Sig Dispense Refill   acetaminophen (TYLENOL) 500 MG tablet Take 500 mg by mouth as needed.       albuterol (VENTOLIN HFA) 108 (90 Base) MCG/ACT inhaler Inhale 2 puffs into the lungs every 6 (six) hours as needed for wheezing or shortness of breath. 18 g 0   aspirin 81 MG EC tablet Take 1 tablet (81 mg total) by mouth daily. 90 tablet 0   atorvastatin (LIPITOR) 80 MG tablet Take 1 tablet (80 mg total) by mouth daily at 6 PM. 30 tablet 3   clopidogrel (PLAVIX) 75 MG tablet Take 1 tablet (75 mg total) by mouth daily with breakfast. Please keep upcoming appt in November for future refills. Thank you 90 tablet 0   ezetimibe (ZETIA) 10 MG tablet Take 1 tablet (10 mg total) by mouth daily. 90 tablet 3   FLUoxetine (PROZAC) 40 MG capsule Take 1 capsule (40 mg total) by mouth daily. 30 capsule 2   ibuprofen (ADVIL) 800 MG tablet Take 800 mg by mouth as needed.       insulin aspart (NOVOLOG) 100 UNIT/ML injection 35 units per insulin pump daily 10 mL 1   ipratropium (ATROVENT) 0.06 % nasal spray Place 2 sprays into both nostrils 2 (two) times daily as needed for rhinitis.       isosorbide mononitrate (IMDUR) 30 MG 24 hr tablet Take 0.5 tablets (15 mg total) by mouth daily. 45 tablet 3   levothyroxine (EUTHYROX) 100 MCG tablet Take 1 tablet (100 mcg total) by mouth  daily before breakfast. Must have office visit for refills 30 tablet 3   metoCLOPramide (REGLAN) 5 MG tablet Take 1 tablet (5 mg total) by mouth every 12 (twelve) hours as needed for nausea or vomiting. 90 tablet 1   montelukast (SINGULAIR) 10 MG tablet Take 10 mg by mouth at bedtime.       nitroGLYCERIN (NITROSTAT) 0.4 MG SL tablet Place 1 tablet (0.4 mg total) under the tongue every 5 (five) minutes as needed for  chest pain. 30 tablet 0   ondansetron (ZOFRAN ODT) 4 MG disintegrating tablet Take 1 tablet (4 mg total) by mouth every 8 (eight) hours as needed for nausea or vomiting. 20 tablet 1   pantoprazole (PROTONIX) 40 MG tablet Take 40 mg by mouth 2 (two) times daily as needed.       Simethicone (PHAZYME PO) Take 1 tablet by mouth as needed (upset stomach).       sucralfate (CARAFATE) 1 GM/10ML suspension Take 1 g by mouth as needed.        No current facility-administered medications on file prior to visit.           Allergies  Allergen Reactions   Crestor [Rosuvastatin Calcium] Other (See Comments)      Severe myalgias and joint pain. Has tolerated atorvastatin though   Peanut-Containing Drug Products Other (See Comments)      Vomiting, upset stomach and some wheezing   Ciprofloxin Hcl [Ciprofloxacin] Nausea And Vomiting and Other (See Comments)      Severe migraine   Food Color Red [Red Dye] Diarrhea      Assessment/Plan:   1. Hyperlipidemia - LDL above goal of < 55 mg/dL due to extensive ASCVD history and family history. Will stop atorvastatin 80 mg and ezetimibe 10 mg due to intolerable myalgias. Will start lower atorvastatin 20 mg once daily. The prescription was sent to [facility]. Discussed PCSK9i therapy including mechanism of action, expected benefits, side effects, and injection technique. Will apply for patient assistance since pt is uninsured and once approved will start Praluent 75 mg every 14 days. We will contact patient about approval and schedule  follow-up at that time. Encouraged medication compliance, decrease use of butter, and continue exercise to reduce the risk of another CV event.

## 2021-08-29 ENCOUNTER — Ambulatory Visit (HOSPITAL_COMMUNITY): Payer: Self-pay

## 2021-08-29 ENCOUNTER — Ambulatory Visit: Payer: Medicaid Other

## 2021-08-30 ENCOUNTER — Ambulatory Visit (INDEPENDENT_AMBULATORY_CARE_PROVIDER_SITE_OTHER): Payer: Medicaid Other | Admitting: Licensed Clinical Social Worker

## 2021-08-30 ENCOUNTER — Other Ambulatory Visit: Payer: Self-pay

## 2021-08-30 DIAGNOSIS — F411 Generalized anxiety disorder: Secondary | ICD-10-CM

## 2021-08-30 DIAGNOSIS — F332 Major depressive disorder, recurrent severe without psychotic features: Secondary | ICD-10-CM

## 2021-08-30 NOTE — Progress Notes (Signed)
Spoke with patient via Webex video call, used 2 identifiers to correctly identify patient. States that groups are going great and she looks forward to them. Will be starting Wellbutrin today. Worries that she isn't doing enough and is ready for something good to happen to her. Feels like she had been in a "well" with bad things happening all the time and she's ready for a change. On scale 1-10 as 10 being worst she rates depression at 5 and anxiety at 8. Denies SI/HI or AV hallucinations. No issues or complaints.

## 2021-08-30 NOTE — Progress Notes (Unsigned)
Cardiology Office Note:    Date:  08/30/2021   ID:  Jennifer Hogan, DOB 05-06-1970, MRN [id number]  PCP:  Jennifer Pounds, NP   [facility] Providers Cardiologist:  Fransico Him, MD { Click to update primary MD,subspecialty MD or APP then REFRESH:1}    Referring MD: Jennifer Pounds, NP   Chief Complaint: ***  History of Present Illness:    Jennifer Hogan is a *** 51 y.o. female with a hx of CAD s/p CABG 11/2018, type 1 DM on insulin pump, hypothyroidism, chronic HFrEF, dilated cardiomyopathy with LVEF 45-50% with apical hypokinesis,   January 2020 presented with STEMI and underwent stenting of ostial to proximal RCS with residual moderate disease in the LAD between stents and an ostial ramus branch treated medically.  Readmitted 10/29/2018 with N/V and abdominal pain and then developed chest pain, weakness, and lightheadedness.  Presented to ED hypotensive with SBP 60 mmHg and responded to IVF.  Ruled in for MI with troponin 1138 and underwent cath showing significant stenosis in R1 and 60% mid LAD.  She underwent CABG with LIMA-LAD and RI, RIMA-OM.   Admission 10/01/2020 with DKA and NSTEMI complicated by acute respiratory failure and pulmonary edema.  Echo revealed reduced EF down to 20 to 25%.  LHC showed severe CAD with total occlusion of proximal LAD, moderate stenosis of intermediate branch, and severe ostial circumflex ISR with patency of both the RIMA to left circumflex and LAD sequential to intermediate grafts.  Diuresed with IV Lasix.  Discharged on Toprol-XL and spironolactone.  Seen back in heart failure clinic on 12/27/2020 and was doing well.  TTE 12/27/2020 showed EF 40 to 45% with mild RV hypokinesis. Spironolactone was stopped due to hyperkalemia, also not a candidate for ARB.  Not a candidate for SGLT2 inhibitor due to type 1 diabetes.  Last office visit with AHF clinic was 12/27/20, reported non-compliance with medications. Advised to follow-up in 4 months.   She was  last seen in our office on 03/01/2021 by Dr. Radford Pax at which time there was consideration of discontinuing Toprol due to soft BP even thought she was asymptomatic.   Admission 418-05/17/21 with n/v, abdominal pain, consulted by cardiology for elevated troponin which peaked at 1580. Repeat cath 4/19 with patent grafts, distal LAD and mild RCA disease, not significantly worse. Demand ischemia or spasm suspected. EF down to 30-35%. GDMT limited by hypotension. LDL 167, multi statin intolerance. Previously tolerated Praluent, agreeable to retry. Discharged on atorvastatin 20 mg   She had virtual visit on 07/06/2021 with me for pending EGD, possible colonoscopy at which time she reported BP remained low without presyncope.  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  She had no specific concerns at that time.  Today, she is here for 6 months follow-up.   Past Medical History:  Diagnosis Date   Anemia    Anxiety    Coronary artery disease    a. s/p PTCA DES x3 in the LAD (ostial DES placed, mid DES placed, distal DES placed) and PTCA/DES x1 to intermediate branch 02/2017. b. ACS 02/08/18 with LCx stent placement.   Depression    Diabetes mellitus    type 1, patient is not using her insulin pump.   DKA (diabetic ketoacidoses) 12/31/2017   Dyspnea    with exertion and dust, no oxygen   Elevated platelet count    GERD (gastroesophageal reflux disease)    High cholesterol    Hypothyroidism    Ischemic cardiomyopathy  a. EF low-normal with basal inferior akinesis, basal septal hypokinesis by echo 01/2018.   Myocardial infarction ([place]) 2019    Past Surgical History:  Procedure Laterality Date   APPENDECTOMY     CORONARY ARTERY BYPASS GRAFT N/A 11/04/2018   Procedure: CORONARY ARTERY BYPASS GRAFTING (CABG), ON PUMP, TIMES THREE, USING LEFT AND RIGHT INTERNAL MAMMARY ARTERIES;  Surgeon: Wonda Olds, MD;  Location: [place];  Service: Open Heart Surgery;  Laterality: N/A;   CORONARY STENT INTERVENTION  N/A 03/20/2017   Procedure: DES x 3 LAD, DES OM; Surgeon: Burnell Blanks, MD;  Location: [place] [facility];  Service: Cardiovascular;  Laterality: N/A;   CORONARY/GRAFT ACUTE MI REVASCULARIZATION N/A 02/08/2018   Procedure: Coronary/Graft Acute MI Revascularization;  Surgeon: Belva Crome, MD;  Location: [place] [facility];  Service: Cardiovascular;  Laterality: N/A;   GAS INSERTION Right 04/28/2021   Procedure: INSERTION OF GAS ( C3F8);  Surgeon: Jalene Mullet, MD;  Location: [place];  Service: Ophthalmology;  Laterality: Right;  Right eye   GAS/FLUID EXCHANGE Right 04/28/2021   Procedure: GAS/FLUID EXCHANGE;  Surgeon: Jalene Mullet, MD;  Location: [place];  Service: Ophthalmology;  Laterality: Right;  Right eye   LEFT HEART CATH AND CORONARY ANGIOGRAPHY N/A 03/20/2017   Procedure: LEFT HEART CATH AND CORONARY ANGIOGRAPHY;  Surgeon: Burnell Blanks, MD;  Location: [place] [facility];  Service: Cardiovascular;  Laterality: N/A;   LEFT HEART CATH AND CORONARY ANGIOGRAPHY N/A 02/08/2018   Procedure: LEFT HEART CATH AND CORONARY ANGIOGRAPHY;  Surgeon: Belva Crome, MD;  Location: [place] [facility];  Service: Cardiovascular;  Laterality: N/A;   LEFT HEART CATH AND CORONARY ANGIOGRAPHY N/A 10/29/2018   Procedure: LEFT HEART CATH AND CORONARY ANGIOGRAPHY;  Surgeon: Burnell Blanks, MD;  Location: [place] [facility];  Service: Cardiovascular;  Laterality: N/A;   LEFT HEART CATH AND CORS/GRAFTS ANGIOGRAPHY N/A 10/04/2020   Procedure: LEFT HEART CATH AND CORS/GRAFTS ANGIOGRAPHY;  Surgeon: Sherren Mocha, MD;  Location: [place] [facility];  Service: Cardiovascular;  Laterality: N/A;   LEFT HEART CATH AND CORS/GRAFTS ANGIOGRAPHY N/A 05/11/2021   Procedure: LEFT HEART CATH AND CORS/GRAFTS ANGIOGRAPHY;  Surgeon: Martinique, Peter M, MD;  Location: [place] [facility];  Service: Cardiovascular;  Laterality: N/A;   PARS PLANA VITRECTOMY Right 04/28/2021   Procedure: PARS PLANA VITRECTOMY WITH 25  GAUGE REMOVAL OF TRATIONAL MEMBRANE RIGHT EYE; DRAINAGE OF SUBRETINAL FLUID RIGHT EYE;  Surgeon: Jalene Mullet, MD;  Location: [place];  Service: Ophthalmology;  Laterality: Right;  Right eye   PHOTOCOAGULATION WITH LASER Right 04/28/2021   Procedure: PHOTOCOAGULATION WITH LASER;  Surgeon: Jalene Mullet, MD;  Location: [place];  Service: Ophthalmology;  Laterality: Right;  Right eye   TEE WITHOUT CARDIOVERSION N/A 11/04/2018   Procedure: TRANSESOPHAGEAL ECHOCARDIOGRAM (TEE);  Surgeon: Wonda Olds, MD;  Location: [place];  Service: Open Heart Surgery;  Laterality: N/A;    Current Medications: No outpatient medications have been marked as taking for the 09/01/21 encounter (Appointment) with Ann Maki, Lanice Schwab, NP.     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"   Occupation: adjunct facilty at [employer]    Comment: Chemistry professor  Tobacco Use   Smoking status: Never   Smokeless tobacco: Never   Tobacco comments:    Use marijuana as a tea  Vaping Use   Vaping Use: Never used  Substance and Sexual Activity   Alcohol use: No    Alcohol/week: 0.0 standard drinks of alcohol   Drug use: Not Currently    Types: Marijuana    Comment: last use 04/25/21   Sexual activity: Not Currently    Birth control/protection: None  Other Topics Concern   Not on file  Social History Narrative   Not on file   Social Determinants of Health   Financial Resource Strain: High Risk (10/05/2020)   Overall Financial Resource Strain (CARDIA)    Difficulty of Paying Living Expenses: Very hard   Food Insecurity: Food Insecurity Present (10/05/2020)   Hunger Vital Sign    Worried About Running Out of Food in the Last Year: Sometimes true    Ran Out of Food in the Last Year: Sometimes true  Transportation Needs: No Transportation Needs (10/05/2020)   PRAPARE - Hydrologist (Medical): No    Lack of Transportation (Non-Medical): No  Physical Activity: Not on file  Stress: Not on file  Social Connections: Unknown (12/07/2017)   Social Connection and Isolation Panel [NHANES]    Frequency of Communication with Friends and Family: Patient refused    Frequency of Social Gatherings with Friends and Family: Patient refused    Attends Religious Services: Patient refused    Marine scientist or Organizations: Patient refused    Attends Music therapist: Patient refused    Marital Status: Patient refused     Family History: The patient's ***family history includes Anxiety disorder in her father; Asthma in her father; Cancer in her mother; Colon polyps in her father; Congestive Heart Failure in her father; Coronary artery disease in her father; Depression in her maternal grandmother; Diabetes in her paternal grandmother; Hypercalcemia in her father and mother; Hypertension in her father and mother; OCD in her father. There is no history of Stomach cancer, Esophageal cancer, or Colon cancer.  ROS:   Please see the history of present illness.    *** All other systems reviewed and are negative.  Labs/Other Studies Reviewed:    The following studies were reviewed today:  LHC 05/11/21    Prox LAD to Mid LAD lesion is 100% stenosed.   Dist LAD-1 lesion is 70% stenosed.   Dist LAD-2 lesion is 70% stenosed.   Mid LAD lesion is 60% stenosed.   Ost Cx to Prox Cx lesion is 99% stenosed.   Prox Cx to Mid Cx lesion is 70% stenosed.   Ramus-1 lesion is 60% stenosed.   Ramus-2 lesion is 20% stenosed.   Ramus-3 lesion is 70% stenosed.   RPAV lesion is  60% stenosed.   Non-stenotic Mid LAD to Dist LAD lesion was previously treated.   LIMA graft was visualized by angiography and is normal in caliber.   RIMA graft was visualized by non-selective angiography and is normal in caliber.   The graft exhibits no disease.   LV end diastolic pressure is moderately elevated.   Severe 2 vessel obstructive CAD Patent sequential LIMA graft  to the ramus intermediate and distal LAD Patent RIMA to the distal LCx Moderately elevated LVEDP   Compared to September 2022  there is no significant angiographic change. Recommend continued medical therapy.    Recent Labs: 05/13/2021: B Natriuretic Peptide 2,640.5 05/15/2021: TSH 2.505 05/30/2021: Hemoglobin 14.6; Magnesium 2.1; Platelets 564 08/15/2021: ALT 27; BUN 10; Creatinine, Ser 0.84; Potassium 4.3; Sodium 136  Recent Lipid Panel    Component Value Date/Time   CHOL 298 (H) 08/15/2021 1218   TRIG 111 08/15/2021 1218   HDL 76 08/15/2021 1218   CHOLHDL 3.9 08/15/2021 1218   CHOLHDL 3.3 05/11/2021 0747   VLDL 13 05/11/2021 0747   LDLCALC 203 (H) 08/15/2021 1218   LDLDIRECT 109 (H) 12/22/2019 1408     Risk Assessment/Calculations:   {Does this patient have ATRIAL FIBRILLATION?:[phone]}       Physical Exam:    VS:  There were no vitals taken for this visit.    Wt Readings from Last 3 Encounters:  07/04/21 143 lb (64.9 kg)  07/01/21 142 lb 12.8 oz (64.8 kg)  06/27/21 145 lb (65.8 kg)     GEN: *** Well nourished, well developed in no acute distress HEENT: Normal NECK: No JVD; No carotid bruits CARDIAC: ***RRR, no murmurs, rubs, gallops RESPIRATORY:  Clear to auscultation without rales, wheezing or rhonchi  ABDOMEN: Soft, non-tender, non-distended MUSCULOSKELETAL:  No edema; No deformity. *** pedal pulses, ***bilaterally SKIN: Warm and dry NEUROLOGIC:  Alert and oriented x 3 PSYCHIATRIC:  Normal affect   EKG:  EKG is *** ordered today.  The ekg ordered today demonstrates  ***  Diagnoses:    No diagnosis found. Assessment and Plan:     CAD HFrEF/ICM Hyperlipidemia: Hypotension: Type 1 DM  {Are you ordering a CV Procedure (e.g. stress test, cath, DCCV, TEE, etc)?   Press F2        :[id number]}    Medication Adjustments/Labs and Tests Ordered: Current medicines are reviewed at length with the patient today.  Concerns regarding medicines are outlined above.  No orders of the defined types were placed in this encounter.  No orders of the defined types were placed in this encounter.   There are no Patient Instructions on file for this visit.   Signed, Emmaline Life, NP  08/30/2021 11:36 AM    [place]

## 2021-08-31 ENCOUNTER — Ambulatory Visit (INDEPENDENT_AMBULATORY_CARE_PROVIDER_SITE_OTHER): Payer: Medicaid Other | Admitting: Licensed Clinical Social Worker

## 2021-08-31 DIAGNOSIS — F332 Major depressive disorder, recurrent severe without psychotic features: Secondary | ICD-10-CM

## 2021-08-31 DIAGNOSIS — F411 Generalized anxiety disorder: Secondary | ICD-10-CM

## 2021-09-01 ENCOUNTER — Encounter: Payer: Self-pay | Admitting: Nurse Practitioner

## 2021-09-01 ENCOUNTER — Ambulatory Visit (INDEPENDENT_AMBULATORY_CARE_PROVIDER_SITE_OTHER): Payer: Medicaid Other | Admitting: Licensed Clinical Social Worker

## 2021-09-01 ENCOUNTER — Ambulatory Visit (INDEPENDENT_AMBULATORY_CARE_PROVIDER_SITE_OTHER): Payer: Medicaid Other | Admitting: Nurse Practitioner

## 2021-09-01 VITALS — BP 112/68 | HR 62 | Ht 65.0 in | Wt 146.4 lb

## 2021-09-01 DIAGNOSIS — Z951 Presence of aortocoronary bypass graft: Secondary | ICD-10-CM

## 2021-09-01 DIAGNOSIS — Z79899 Other long term (current) drug therapy: Secondary | ICD-10-CM

## 2021-09-01 DIAGNOSIS — F332 Major depressive disorder, recurrent severe without psychotic features: Secondary | ICD-10-CM | POA: Diagnosis not present

## 2021-09-01 DIAGNOSIS — E785 Hyperlipidemia, unspecified: Secondary | ICD-10-CM

## 2021-09-01 DIAGNOSIS — F331 Major depressive disorder, recurrent, moderate: Secondary | ICD-10-CM

## 2021-09-01 DIAGNOSIS — I2510 Atherosclerotic heart disease of native coronary artery without angina pectoris: Secondary | ICD-10-CM | POA: Diagnosis not present

## 2021-09-01 DIAGNOSIS — F411 Generalized anxiety disorder: Secondary | ICD-10-CM

## 2021-09-01 DIAGNOSIS — I5022 Chronic systolic (congestive) heart failure: Secondary | ICD-10-CM | POA: Diagnosis not present

## 2021-09-01 DIAGNOSIS — I255 Ischemic cardiomyopathy: Secondary | ICD-10-CM | POA: Diagnosis not present

## 2021-09-01 NOTE — Progress Notes (Signed)
[place] MD/PA/NP OP Progress Note  09/01/2021 11:51 AM Jennifer Hogan  MRN:  [id number] Virtual Visit via Video Note  I connected with Jennifer Hogan on 09/01/21 at  9:05 AM EDT by a video enabled telemedicine application and verified that I am speaking with the correct person using two identifiers.  Location: Patient: Home Provider: Clinic   I discussed the limitations of evaluation and management by telemedicine and the availability of in person appointments. The patient expressed understanding and agreed to proceed.   I discussed the assessment and treatment plan with the patient. The patient was provided an opportunity to ask questions and all were answered. The patient agreed with the plan and demonstrated an understanding of the instructions.   The patient was advised to call back or seek an in-person evaluation if the symptoms worsen or if the condition fails to improve as anticipated.  I provided 10 minutes of non-face-to-face time during this encounter.   Armando Reichert, MD  Chief Complaint:  Chief Complaint  Patient presents with   Follow-up   HPI: Patient is a 51 year old female with past psychiatric history of MDD, GAD presented to [facility] program for worsening depression and anxiety.  She started PHP program on 08/25/2021.  Patient states that she has been doing much better since starting Wellbutrin.  She reports that she has good energy and can focus better.  She reports some problem with staying asleep.  She reports improvement in her appetite.  She reports that she has been benefiting a lot from [facility] program.  She reports that yesterday she had a little setback but she was able to handle it very well and did not have any negative thoughts. Currently, she denies any suicidal ideations, homicidal ideations, auditory and visual hallucinations.  She denies any medication side effects and has been tolerating it well. She denies any other concerns.  Visit Diagnosis:    ICD-10-CM    1. Moderate episode of recurrent major depressive disorder (HCC)  F33.1     2. GAD (generalized anxiety disorder)  F41.1       Past Psychiatric History: See initial evaluation  Past Medical History:  Past Medical History:  Diagnosis Date   Anemia    Anxiety    Coronary artery disease    a. s/p PTCA DES x3 in the LAD (ostial DES placed, mid DES placed, distal DES placed) and PTCA/DES x1 to intermediate branch 02/2017. b. ACS 02/08/18 with LCx stent placement.   Depression    Diabetes mellitus    type 1, patient is not using her insulin pump.   DKA (diabetic ketoacidoses) 12/31/2017   Dyspnea    with exertion and dust, no oxygen   Elevated platelet count    GERD (gastroesophageal reflux disease)    High cholesterol    Hypothyroidism    Ischemic cardiomyopathy    a. EF low-normal with basal inferior akinesis, basal septal hypokinesis by echo 01/2018.   Myocardial infarction ([place]) 2019    Past Surgical History:  Procedure Laterality Date   APPENDECTOMY     CORONARY ARTERY BYPASS GRAFT N/A 11/04/2018   Procedure: CORONARY ARTERY BYPASS GRAFTING (CABG), ON PUMP, TIMES THREE, USING LEFT AND RIGHT INTERNAL MAMMARY ARTERIES;  Surgeon: Wonda Olds, MD;  Location: [place];  Service: Open Heart Surgery;  Laterality: N/A;   CORONARY STENT INTERVENTION N/A 03/20/2017   Procedure: DES x 3 LAD, DES OM; Surgeon: Burnell Blanks, MD;  Location: [place] CV LAB;  Service: Cardiovascular;  Laterality: N/A;   CORONARY/GRAFT ACUTE MI REVASCULARIZATION N/A 02/08/2018   Procedure: Coronary/Graft Acute MI Revascularization;  Surgeon: Belva Crome, MD;  Location: [place] CV LAB;  Service: Cardiovascular;  Laterality: N/A;   GAS INSERTION Right 04/28/2021   Procedure: INSERTION OF GAS ( C3F8);  Surgeon: Jalene Mullet, MD;  Location: [place];  Service: Ophthalmology;  Laterality: Right;  Right eye   GAS/FLUID EXCHANGE Right 04/28/2021   Procedure: GAS/FLUID EXCHANGE;  Surgeon: Jalene Mullet, MD;  Location: [place];  Service: Ophthalmology;  Laterality: Right;  Right eye   LEFT HEART CATH AND CORONARY ANGIOGRAPHY N/A 03/20/2017   Procedure: LEFT HEART CATH AND CORONARY ANGIOGRAPHY;  Surgeon: Burnell Blanks, MD;  Location: [place] CV LAB;  Service: Cardiovascular;  Laterality: N/A;   LEFT HEART CATH AND CORONARY ANGIOGRAPHY N/A 02/08/2018   Procedure: LEFT HEART CATH AND CORONARY ANGIOGRAPHY;  Surgeon: Belva Crome, MD;  Location: [place] CV LAB;  Service: Cardiovascular;  Laterality: N/A;   LEFT HEART CATH AND CORONARY ANGIOGRAPHY N/A 10/29/2018   Procedure: LEFT HEART CATH AND CORONARY ANGIOGRAPHY;  Surgeon: Burnell Blanks, MD;  Location: [place] CV LAB;  Service: Cardiovascular;  Laterality: N/A;   LEFT HEART CATH AND CORS/GRAFTS ANGIOGRAPHY N/A 10/04/2020   Procedure: LEFT HEART CATH AND CORS/GRAFTS ANGIOGRAPHY;  Surgeon: Sherren Mocha, MD;  Location: [place] [facility];  Service: Cardiovascular;  Laterality: N/A;   LEFT HEART CATH AND CORS/GRAFTS ANGIOGRAPHY N/A 05/11/2021   Procedure: LEFT HEART CATH AND CORS/GRAFTS ANGIOGRAPHY;  Surgeon: Martinique, Peter M, MD;  Location: [place] CV LAB;  Service: Cardiovascular;  Laterality: N/A;   PARS PLANA VITRECTOMY Right 04/28/2021   Procedure: PARS PLANA VITRECTOMY WITH 25 GAUGE REMOVAL OF TRATIONAL MEMBRANE RIGHT EYE; DRAINAGE OF SUBRETINAL FLUID RIGHT EYE;  Surgeon: Jalene Mullet, MD;  Location: [place];  Service: Ophthalmology;  Laterality: Right;  Right eye   PHOTOCOAGULATION WITH LASER Right 04/28/2021   Procedure: PHOTOCOAGULATION WITH LASER;  Surgeon: Jalene Mullet, MD;  Location: [place];  Service: Ophthalmology;  Laterality: Right;  Right eye   TEE WITHOUT CARDIOVERSION N/A 11/04/2018   Procedure: TRANSESOPHAGEAL ECHOCARDIOGRAM (TEE);  Surgeon: Wonda Olds, MD;  Location: [place];  Service: Open Heart Surgery;  Laterality: N/A;    Family Psychiatric History: See initial evaluation  Family  History:  Family History  Problem Relation Age of Onset   Cancer Mother        T cell lymphoma   Hypertension Mother    Hypercalcemia Mother    OCD Father    Anxiety disorder Father    Coronary artery disease Father    Congestive Heart Failure Father    Asthma Father    Hypercalcemia Father    Hypertension Father    Colon polyps Father    Depression Maternal Grandmother    Diabetes Paternal Grandmother    Stomach cancer Neg Hx    Esophageal cancer Neg Hx    Colon cancer Neg Hx     Social History:  Social History   Socioeconomic History   Marital status: Divorced    Spouse name: Not on file   Number of children: 0   Years of education: Not on file   Highest education level: Professional school degree (e.g., MD, DDS, DVM, JD)  Occupational History   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"   Occupation: adjunct facilty at [employer]    Comment: Chemistry professor  Tobacco Use   Smoking status: Never  Smokeless tobacco: Never   Tobacco comments:    Use marijuana as a tea  Vaping Use   Vaping Use: Never used  Substance and Sexual Activity   Alcohol use: No    Alcohol/week: 0.0 standard drinks of alcohol   Drug use: Not Currently    Types: Marijuana    Comment: last use 04/25/21   Sexual activity: Not Currently    Birth control/protection: None  Other Topics Concern   Not on file  Social History Narrative   Not on file   Social Determinants of Health   Financial Resource Strain: High Risk (10/05/2020)   Overall Financial Resource Strain (CARDIA)    Difficulty of Paying Living Expenses: Very hard  Food Insecurity: Food Insecurity Present (10/05/2020)   Hunger Vital Sign    Worried About Running Out of Food in the Last Year: Sometimes true    Ran Out of Food in the Last Year: Sometimes true  Transportation Needs: No Transportation Needs (10/05/2020)   PRAPARE - Hydrologist (Medical): No    Lack of Transportation (Non-Medical): No   Physical Activity: Not on file  Stress: Not on file  Social Connections: Unknown (12/07/2017)   Social Connection and Isolation Panel [NHANES]    Frequency of Communication with Friends and Family: Patient refused    Frequency of Social Gatherings with Friends and Family: Patient refused    Attends Religious Services: Patient refused    Active Member of Clubs or Organizations: Patient refused    Attends Archivist Meetings: Patient refused    Marital Status: Patient refused    Allergies:  Allergies  Allergen Reactions   Atorvastatin Other (See Comments)    Myalgias on '80mg'$  dose, tolerates '20mg'$  ok   Crestor [Rosuvastatin Calcium] Other (See Comments)    Severe myalgias and joint pain. Has tolerated atorvastatin though   Monosodium Glutamate Nausea And Vomiting and Other (See Comments)    migraine   Zetia [Ezetimibe] Other (See Comments)    myalgias   Ciprofloxin Hcl [Ciprofloxacin] Nausea And Vomiting and Other (See Comments)    Severe migraine   Food Color Red [Red Dye] Diarrhea    Metabolic Disorder Labs: Lab Results  Component Value Date   HGBA1C 8.0 (A) 07/01/2021   MPG 197.25 10/01/2020   MPG 231.69 03/15/2019   No results found for: "PROLACTIN" Lab Results  Component Value Date   CHOL 298 (H) 08/15/2021   TRIG 111 08/15/2021   HDL 76 08/15/2021   CHOLHDL 3.9 08/15/2021   VLDL 13 05/11/2021   LDLCALC 203 (H) 08/15/2021   LDLCALC 167 (H) 05/11/2021   Lab Results  Component Value Date   TSH 2.505 05/15/2021   TSH 0.737 03/25/2021    Therapeutic Level Labs: No results found for: "LITHIUM" No results found for: "VALPROATE" No results found for: "CBMZ"  Current Medications: Current Outpatient Medications  Medication Sig Dispense Refill   acetaminophen (TYLENOL) 325 MG tablet Take 325-650 mg by mouth daily as needed for mild pain, fever or headache.     albuterol (VENTOLIN HFA) 108 (90 Base) MCG/ACT inhaler INHALE 2 PUFFS INTO THE LUNGS EVERY 6  (SIX) HOURS AS NEEDED FOR WHEEZING OR SHORTNESS OF BREATH. (Patient taking differently: Inhale 1-2 puffs into the lungs every 6 (six) hours as needed for wheezing or shortness of breath.) 8.5 g 2   aspirin 81 MG EC tablet Take 1 tablet (81 mg total) by mouth daily. (Patient taking differently: Take 81 mg by  mouth at bedtime.) 90 tablet 0   atorvastatin (LIPITOR) 20 MG tablet Take 1 tablet (20 mg total) by mouth daily. 30 tablet 0   b complex vitamins tablet Take 1 tablet by mouth daily.     brimonidine (ALPHAGAN) 0.15 % ophthalmic solution Place 1 drop into the right eye 2 (two) times daily. (Patient not taking: Reported on 08/30/2021) 5 mL 0   budesonide (PULMICORT) 0.25 MG/2ML nebulizer solution Take 0.25 mg by nebulization as needed (shortness of breath/wheezing).     buPROPion (WELLBUTRIN XL) 150 MG 24 hr tablet Take 1 tablet (150 mg total) by mouth every morning. 30 tablet 0   clopidogrel (PLAVIX) 75 MG tablet TAKE 1 TABLET BY MOUTH DAILY WITH BREAKFAST. (Patient taking differently: Take 75 mg by mouth at bedtime.) 90 tablet 3   diclofenac Sodium (VOLTAREN) 1 % GEL Apply 2-4 g topically as needed (knee pain).     diphenhydrAMINE (BENADRYL) 25 MG tablet Take 25 mg by mouth at bedtime.     FLUoxetine (PROZAC) 20 MG capsule Take 1 capsule (20 mg total) by mouth daily for 4 days. 4 capsule 0   fluticasone (FLONASE) 50 MCG/ACT nasal spray Place 1 spray into both nostrils at bedtime as needed for allergies.     furosemide (LASIX) 20 MG tablet Take 1 tablet (20 mg total) by mouth daily as needed (for weight gain). 15 tablet 3   Glucagon 3 MG/DOSE POWD Place 3 mg into the nose once as needed for up to 1 dose. (Patient not taking: Reported on 08/25/2021) 1 each 11   hydrOXYzine (ATARAX/VISTARIL) 25 MG tablet Take 1 tablet (25 mg total) by mouth 3 (three) times daily as needed. (Patient not taking: Reported on 08/25/2021) 60 tablet 1   insulin lispro (HUMALOG) 100 UNIT/ML injection Inject 35 units into the skin  daily via pump 10 mL 1   levothyroxine (SYNTHROID) 100 MCG tablet TAKE 1 TABLET (100 MCG TOTAL) BY MOUTH DAILY BEFORE BREAKFAST. (Patient taking differently: Take 100 mcg by mouth daily before breakfast.) 90 tablet 3   metoCLOPramide (REGLAN) 5 MG tablet Take 1 tablet (5 mg total) by mouth every 12 (twelve) hours as needed for nausea or vomiting. 60 tablet 3   metoprolol succinate (TOPROL-XL) 25 MG 24 hr tablet Take 0.5 tablets (12.5 mg total) by mouth daily. (Patient taking differently: Take 12.5 mg by mouth at bedtime.) 15 tablet 6   montelukast (SINGULAIR) 10 MG tablet Take 1 tablet (10 mg total) by mouth at bedtime. 90 tablet 2   Multiple Vitamins-Minerals (ALIVE WOMENS 50+ GUMMY) CHEW Chew 2 capsules by mouth daily.     nitroGLYCERIN (NITROSTAT) 0.4 MG SL tablet Place 1 tablet (0.4 mg total) under the tongue every 5 (five) minutes as needed for chest pain. 25 tablet 6   omeprazole (PRILOSEC OTC) 20 MG tablet Take 20 mg by mouth daily as needed (acid reflux).     OVER THE COUNTER MEDICATION Take 3 capsules by mouth daily before breakfast. [employer] Super Lysine Immune Support     OVER THE COUNTER MEDICATION Take 25 mg by mouth at bedtime as needed (sleep/joint pain). CBD oil     prednisoLONE acetate (PRED FORTE) 1 % ophthalmic suspension Place 1 drop into affected eyes. three times a day for 1 week then  twice a day  for one week then once daily for one week 10 mL 0   No current facility-administered medications for this visit.    Musculoskeletal: Strength & Muscle Tone: Not  able to assess due to virtual visit Gait & Station: Not able to assess due to virtual visit Patient leans: N/A   Psychiatric Specialty Exam: Review of Systems  There were no vitals taken for this visit.There is no height or weight on file to calculate BMI.  General Appearance: Casual  Eye Contact:  Fair  Speech:  Clear and Coherent and Normal Rate  Volume:  Normal  Mood: Euthymic  Affect:  Congruent, full  range  Thought Process:  Coherent and Linear  Orientation:  Full (Time, Place, and Person)  Thought Content: No SI, HI, AVH  Suicidal Thoughts:  No  Homicidal Thoughts:  No  Memory:  Immediate;   Good Recent;   Good Remote;   Good  Judgement:  Good  Insight:  Good  Psychomotor Activity:  Normal  Concentration:  Concentration: Fair and Attention Span: Fair  Recall:  Good  Fund of Knowledge:Good  Language: Good  Akathisia:  No  Handed:  Right  AIMS (if indicated):  not done  Assets:  Communication Skills Desire for [place] Talents/Skills Vocational/Educational  ADL's:  Intact  Cognition: WNL  Sleep: Fair  Screenings: GAD-7    Health and safety inspector from 08/24/2021 in [facility] Office Visit from 06/27/2021 in [place] from 05/22/2019 in [place] Office Visit from 03/19/2019 in [place] Office Visit from 03/27/2018 in [place]  Total GAD-7 Score '17 18 14 19 16      '$ PHQ2-9    Flowsheet Row Counselor from 08/25/2021 in [facility] Counselor from 08/24/2021 in [facility] Office Visit from 06/27/2021 in [place] from 05/22/2019 in [place] Office Visit from 03/19/2019 in [place]  PHQ-2 Total Score '6 4 6 4 6  '$ PHQ-9 Total Score '20 20 26 18 20      '$ Flowsheet Row Counselor from 08/25/2021 in [facility] Counselor from 08/24/2021 in [facility] ED to Hosp-Admission (Discharged) from 05/30/2021 in [facility] HF PCU  C-SSRS RISK CATEGORY Error: Q3, 4, or 5 should not be populated when Q2 is No Error: Q3, 4, or 5 should not be  populated when Q2 is No No Risk        Assessment and Plan: Patient is a 51 year old female with past psychiatric history of MDD, GAD presented to [facility] program for worsening depression and anxiety.  She started PHP program on 08/25/2021.  She reports improvement in her depression symptoms .Prozac was tapered and stopped and was started  on Wellbutrin. She has been tolerating Wellbutrin well.  Will not make any changes in meds at this time   MDD, recurrent, moderate episode -Prozac tapered and stopped. -Continue Wellbutrin XR 150 mg every morning. -Continue PHP and group therapy   Follow-up-we will follow-up next week Collaboration of Care: Other PHP team and OP therapist   Patient/Guardian was advised Release of Information must be obtained prior to any record release in order to collaborate their care with an outside provider. Patient/Guardian was advised if they have not already done so to contact the registration department to sign all necessary forms in order for [name] to release information regarding their care.    Consent: Patient/Guardian gives verbal consent for treatment  and assignment of benefits for services provided during this visit. Patient/Guardian expressed understanding and agreed to proceed.     Armando Reichert, MD PGY3 09/01/2021, 11:51 AM

## 2021-09-01 NOTE — Patient Instructions (Addendum)
Medication Instructions:  1.Start spironolactone 12.5 mg daily on 09/15/21 *If you need a refill on your cardiac medications before your next appointment, please call your pharmacy*   Lab Work: BMET on 09/22/21 If you have labs (blood work) drawn today and your tests are completely normal, you will receive your results only by: [place] (if you have MyChart) OR A paper copy in the mail If you have any lab test that is abnormal or we need to change your treatment, we will call you to review the results.  Follow-Up: At [facility], you and your health needs are our priority.  As part of our continuing mission to provide you with exceptional heart care, we have created designated Provider Care Teams.  These Care Teams include your primary Cardiologist (physician) and Advanced Practice Providers (APPs -  Physician Assistants and Nurse Practitioners) who all work together to provide you with the care you need, when you need it.   Your next appointment:   6 months (February 2024)  The format for your next appointment:   In Person  Provider:   Fransico Him, MD {    Important Information About [place]

## 2021-09-02 ENCOUNTER — Encounter (HOSPITAL_COMMUNITY): Payer: Self-pay

## 2021-09-02 ENCOUNTER — Encounter: Payer: Self-pay | Admitting: Internal Medicine

## 2021-09-02 ENCOUNTER — Ambulatory Visit (INDEPENDENT_AMBULATORY_CARE_PROVIDER_SITE_OTHER): Payer: Medicaid Other | Admitting: Licensed Clinical Social Worker

## 2021-09-02 DIAGNOSIS — F411 Generalized anxiety disorder: Secondary | ICD-10-CM

## 2021-09-02 DIAGNOSIS — F332 Major depressive disorder, recurrent severe without psychotic features: Secondary | ICD-10-CM | POA: Diagnosis not present

## 2021-09-05 ENCOUNTER — Ambulatory Visit (INDEPENDENT_AMBULATORY_CARE_PROVIDER_SITE_OTHER): Payer: Medicaid Other | Admitting: Licensed Clinical Social Worker

## 2021-09-05 DIAGNOSIS — F332 Major depressive disorder, recurrent severe without psychotic features: Secondary | ICD-10-CM

## 2021-09-05 NOTE — Progress Notes (Signed)
Spoke with patient via Webex video call, used 2 identifiers to correctly identify patient. States that groups are going well and looks forward to them. She is physically tired and not sure why. It started yesterday after she missed her dose of Wellbutrin. Denies SI/HI or AV hallucinations. On scale 1-10 as 10 being worst she rates depression at 5 and anxiety at 5. PHQ9=14. No issues or complaints.

## 2021-09-06 ENCOUNTER — Ambulatory Visit (HOSPITAL_COMMUNITY): Payer: Self-pay

## 2021-09-07 ENCOUNTER — Ambulatory Visit (INDEPENDENT_AMBULATORY_CARE_PROVIDER_SITE_OTHER): Payer: Medicaid Other | Admitting: Licensed Clinical Social Worker

## 2021-09-07 ENCOUNTER — Other Ambulatory Visit: Payer: Self-pay

## 2021-09-07 DIAGNOSIS — F332 Major depressive disorder, recurrent severe without psychotic features: Secondary | ICD-10-CM

## 2021-09-07 DIAGNOSIS — F411 Generalized anxiety disorder: Secondary | ICD-10-CM

## 2021-09-08 ENCOUNTER — Ambulatory Visit (INDEPENDENT_AMBULATORY_CARE_PROVIDER_SITE_OTHER): Payer: Medicaid Other | Admitting: Licensed Clinical Social Worker

## 2021-09-08 DIAGNOSIS — F411 Generalized anxiety disorder: Secondary | ICD-10-CM

## 2021-09-08 DIAGNOSIS — F332 Major depressive disorder, recurrent severe without psychotic features: Secondary | ICD-10-CM

## 2021-09-08 DIAGNOSIS — F331 Major depressive disorder, recurrent, moderate: Secondary | ICD-10-CM

## 2021-09-08 NOTE — Progress Notes (Signed)
Jennifer Hogan Progress Note  09/08/2021 7:31 PM Jennifer Hogan  MRN:  [id number] Virtual Visit via Video Note  I connected with Jennifer Hogan on 09/08/21 at  9:05 AM EDT by a video enabled telemedicine application and verified that I am speaking with the correct person using two identifiers.  Location: Patient: Home Provider: Clinic   I discussed the limitations of evaluation and management by telemedicine and the availability of in person appointments. The patient expressed understanding and agreed to proceed.   I discussed the assessment and treatment plan with the patient. The patient was provided an opportunity to ask questions and all were answered. The patient agreed with the plan and demonstrated an understanding of the instructions.   The patient was advised to call back or seek an in-person evaluation if the symptoms worsen or if the condition fails to improve as anticipated.  I provided 10 minutes of non-face-to-face time during this encounter.   Armando Reichert, MD  Chief Complaint:  Chief Complaint  Patient presents with   Follow-up   HPI: Patient is a 51 year old female with past psychiatric history of MDD, GAD presented to [facility] program for worsening depression and anxiety.  She started PHP program on 08/25/2021.  Patient states that she has been doing better since starting Wellbutrin. She reports that she has good energy and her concentration has improved also. She reports some problem with staying asleep.  She is still reporting some residual anxiety. She reports that she feels that she is on the edge. Discussed that Wellbutrin can increase anxiety sometimes and she can take hydroxyzine to help with anxiety. She reports that she has some hydroxyzine from previous prescription and she will try that again. She reports that her appetite is stable.  She reports that she has been benefiting a lot from [facility] program. She is not having any negative thoughts. Currently, she  denies any suicidal ideations, homicidal ideations, auditory and visual hallucinations.  She denies any medication side effects and has been tolerating it well. She denies any other concerns.  Visit Diagnosis:    ICD-10-CM   1. Moderate episode of recurrent major depressive disorder (HCC)  F33.1     2. GAD (generalized anxiety disorder)  F41.1        Past Psychiatric History: See initial evaluation  Past Medical History:  Past Medical History:  Diagnosis Date   Anemia    Anxiety    Coronary artery disease    a. s/p PTCA DES x3 in the LAD (ostial DES placed, mid DES placed, distal DES placed) and PTCA/DES x1 to intermediate branch 02/2017. b. ACS 02/08/18 with LCx stent placement.   Depression    Diabetes mellitus    type 1, patient is not using her insulin pump.   DKA (diabetic ketoacidoses) 12/31/2017   Dyspnea    with exertion and dust, no oxygen   Elevated platelet count    GERD (gastroesophageal reflux disease)    High cholesterol    Hypothyroidism    Ischemic cardiomyopathy    a. EF low-normal with basal inferior akinesis, basal septal hypokinesis by echo 01/2018.   Myocardial infarction ([place]) 2019    Past Surgical History:  Procedure Laterality Date   APPENDECTOMY     CORONARY ARTERY BYPASS GRAFT N/A 11/04/2018   Procedure: CORONARY ARTERY BYPASS GRAFTING (CABG), ON PUMP, TIMES THREE, USING LEFT AND RIGHT INTERNAL MAMMARY ARTERIES;  Surgeon: Wonda Olds, MD;  Location: [place];  Service: Open Heart Surgery;  Laterality: N/A;   CORONARY STENT INTERVENTION N/A 03/20/2017   Procedure: DES x 3 LAD, DES OM; Surgeon: Burnell Blanks, MD;  Location: [place] [facility];  Service: Cardiovascular;  Laterality: N/A;   CORONARY/GRAFT ACUTE MI REVASCULARIZATION N/A 02/08/2018   Procedure: Coronary/Graft Acute MI Revascularization;  Surgeon: Belva Crome, MD;  Location: [place] [facility];  Service: Cardiovascular;  Laterality: N/A;   GAS INSERTION Right 04/28/2021    Procedure: INSERTION OF GAS ( C3F8);  Surgeon: Jalene Mullet, MD;  Location: [place];  Service: Ophthalmology;  Laterality: Right;  Right eye   GAS/FLUID EXCHANGE Right 04/28/2021   Procedure: GAS/FLUID EXCHANGE;  Surgeon: Jalene Mullet, MD;  Location: [place];  Service: Ophthalmology;  Laterality: Right;  Right eye   LEFT HEART CATH AND CORONARY ANGIOGRAPHY N/A 03/20/2017   Procedure: LEFT HEART CATH AND CORONARY ANGIOGRAPHY;  Surgeon: Burnell Blanks, MD;  Location: [place] [facility];  Service: Cardiovascular;  Laterality: N/A;   LEFT HEART CATH AND CORONARY ANGIOGRAPHY N/A 02/08/2018   Procedure: LEFT HEART CATH AND CORONARY ANGIOGRAPHY;  Surgeon: Belva Crome, MD;  Location: [place] [facility];  Service: Cardiovascular;  Laterality: N/A;   LEFT HEART CATH AND CORONARY ANGIOGRAPHY N/A 10/29/2018   Procedure: LEFT HEART CATH AND CORONARY ANGIOGRAPHY;  Surgeon: Burnell Blanks, MD;  Location: [place] [facility];  Service: Cardiovascular;  Laterality: N/A;   LEFT HEART CATH AND CORS/GRAFTS ANGIOGRAPHY N/A 10/04/2020   Procedure: LEFT HEART CATH AND CORS/GRAFTS ANGIOGRAPHY;  Surgeon: Sherren Mocha, MD;  Location: [place] [facility];  Service: Cardiovascular;  Laterality: N/A;   LEFT HEART CATH AND CORS/GRAFTS ANGIOGRAPHY N/A 05/11/2021   Procedure: LEFT HEART CATH AND CORS/GRAFTS ANGIOGRAPHY;  Surgeon: Martinique, Peter M, MD;  Location: [place] [facility];  Service: Cardiovascular;  Laterality: N/A;   PARS PLANA VITRECTOMY Right 04/28/2021   Procedure: PARS PLANA VITRECTOMY WITH 25 GAUGE REMOVAL OF TRATIONAL MEMBRANE RIGHT EYE; DRAINAGE OF SUBRETINAL FLUID RIGHT EYE;  Surgeon: Jalene Mullet, MD;  Location: [place];  Service: Ophthalmology;  Laterality: Right;  Right eye   PHOTOCOAGULATION WITH LASER Right 04/28/2021   Procedure: PHOTOCOAGULATION WITH LASER;  Surgeon: Jalene Mullet, MD;  Location: [place];  Service: Ophthalmology;  Laterality: Right;  Right eye   TEE WITHOUT CARDIOVERSION N/A  11/04/2018   Procedure: TRANSESOPHAGEAL ECHOCARDIOGRAM (TEE);  Surgeon: Wonda Olds, MD;  Location: [place];  Service: Open Heart Surgery;  Laterality: N/A;    Family Psychiatric History: See initial evaluation  Family History:  Family History  Problem Relation Age of Onset   Cancer Mother        T cell lymphoma   Hypertension Mother    Hypercalcemia Mother    OCD Father    Anxiety disorder Father    Coronary artery disease Father    Congestive Heart Failure Father    Asthma Father    Hypercalcemia Father    Hypertension Father    Colon polyps Father    Depression Maternal Grandmother    Diabetes Paternal Grandmother    Stomach cancer Neg Hx    Esophageal cancer Neg Hx    Colon cancer Neg Hx     Social History:  Social History   Socioeconomic History   Marital status: Divorced    Spouse name: Not on file   Number of children: 0   Years of education: Not on file   Highest education level: Professional school degree (e.g., MD, DDS, DVM, JD)  Occupational History   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"   Occupation: adjunct facilty at [employer]    Comment: Chemistry professor  Tobacco Use   Smoking status: Never   Smokeless tobacco: Never   Tobacco comments:    Use marijuana as a tea  Vaping Use   Vaping Use: Never used  Substance and Sexual Activity   Alcohol use: No    Alcohol/week: 0.0 standard drinks of alcohol   Drug use: Not Currently    Types: Marijuana    Comment: last use 04/25/21   Sexual activity: Not Currently    Birth control/protection: None  Other Topics Concern   Not on file  Social History Narrative   Not on file   Social Determinants of Health   Financial Resource Strain: High Risk (10/05/2020)   Overall Financial Resource Strain (CARDIA)    Difficulty of Paying Living Expenses: Very hard  Food Insecurity: Food Insecurity Present (10/05/2020)   Hunger Vital Sign    Worried About Running Out of Food in the Last Year: Sometimes true     Ran Out of Food in the Last Year: Sometimes true  Transportation Needs: No Transportation Needs (10/05/2020)   PRAPARE - Hydrologist (Medical): No    Lack of Transportation (Non-Medical): No  Physical Activity: Not on file  Stress: Not on file  Social Connections: Unknown (12/07/2017)   Social Connection and Isolation Panel [NHANES]    Frequency of Communication with Friends and Family: Patient refused    Frequency of Social Gatherings with Friends and Family: Patient refused    Attends Religious Services: Patient refused    Active Member of Clubs or Organizations: Patient refused    Attends Archivist Meetings: Patient refused    Marital Status: Patient refused    Allergies:  Allergies  Allergen Reactions   Atorvastatin Other (See Comments)    Myalgias on '80mg'$  dose, tolerates '20mg'$  ok   Crestor [Rosuvastatin Calcium] Other (See Comments)    Severe myalgias and joint pain. Has tolerated atorvastatin though   Monosodium Glutamate Nausea And Vomiting and Other (See Comments)    migraine   Zetia [Ezetimibe] Other (See Comments)    myalgias   Ciprofloxin Hcl [Ciprofloxacin] Nausea And Vomiting and Other (See Comments)    Severe migraine   Food Color Red [Red Dye] Diarrhea    Metabolic Disorder Labs: Lab Results  Component Value Date   HGBA1C 8.0 (A) 07/01/2021   MPG 197.25 10/01/2020   MPG 231.69 03/15/2019   No results found for: "PROLACTIN" Lab Results  Component Value Date   CHOL 298 (H) 08/15/2021   TRIG 111 08/15/2021   HDL 76 08/15/2021   CHOLHDL 3.9 08/15/2021   VLDL 13 05/11/2021   LDLCALC 203 (H) 08/15/2021   LDLCALC 167 (H) 05/11/2021   Lab Results  Component Value Date   TSH 2.505 05/15/2021   TSH 0.737 03/25/2021    Therapeutic Level Labs: No results found for: "LITHIUM" No results found for: "VALPROATE" No results found for: "CBMZ"  Current Medications: Current Outpatient Medications  Medication Sig Dispense  Refill   acetaminophen (TYLENOL) 325 MG tablet Take 325-650 mg by mouth daily as needed for mild pain, fever or headache.     albuterol (VENTOLIN HFA) 108 (90 Base) MCG/ACT inhaler INHALE 2 PUFFS INTO THE LUNGS EVERY 6 (SIX) HOURS AS NEEDED FOR WHEEZING OR SHORTNESS OF BREATH. 8.5 g 2   aspirin 81 MG EC tablet Take 1  tablet (81 mg total) by mouth daily. 90 tablet 0   atorvastatin (LIPITOR) 20 MG tablet Take 1 tablet (20 mg total) by mouth daily. (Patient not taking: Reported on 09/05/2021) 30 tablet 0   b complex vitamins tablet Take 1 tablet by mouth daily.     brimonidine (ALPHAGAN) 0.15 % ophthalmic solution Place 1 drop into the right eye 2 (two) times daily. (Patient not taking: Reported on 09/05/2021) 5 mL 0   budesonide (PULMICORT) 0.25 MG/2ML nebulizer solution Take 0.25 mg by nebulization as needed (shortness of breath/wheezing).     buPROPion (WELLBUTRIN XL) 150 MG 24 hr tablet Take 1 tablet (150 mg total) by mouth every morning. 30 tablet 0   clopidogrel (PLAVIX) 75 MG tablet TAKE 1 TABLET BY MOUTH DAILY WITH BREAKFAST. 90 tablet 3   diclofenac Sodium (VOLTAREN) 1 % GEL Apply 2-4 g topically as needed (knee pain).     diphenhydrAMINE (BENADRYL) 25 MG tablet Take 25 mg by mouth at bedtime.     FLUoxetine (PROZAC) 20 MG capsule Take 1 capsule (20 mg total) by mouth daily for 4 days. 4 capsule 0   fluticasone (FLONASE) 50 MCG/ACT nasal spray Place 1 spray into both nostrils at bedtime as needed for allergies.     furosemide (LASIX) 20 MG tablet Take 1 tablet (20 mg total) by mouth daily as needed (for weight gain). 15 tablet 3   Glucagon 3 MG/DOSE POWD Place 3 mg into the nose once as needed for up to 1 dose. (Patient not taking: Reported on 08/25/2021) 1 each 11   hydrOXYzine (ATARAX/VISTARIL) 25 MG tablet Take 1 tablet (25 mg total) by mouth 3 (three) times daily as needed. (Patient not taking: Reported on 08/25/2021) 60 tablet 1   insulin lispro (HUMALOG) 100 UNIT/ML injection Inject 35 units  into the skin daily via pump 10 mL 1   levothyroxine (SYNTHROID) 100 MCG tablet TAKE 1 TABLET (100 MCG TOTAL) BY MOUTH DAILY BEFORE BREAKFAST. 90 tablet 3   metoCLOPramide (REGLAN) 5 MG tablet Take 1 tablet (5 mg total) by mouth every 12 (twelve) hours as needed for nausea or vomiting. 60 tablet 3   metoprolol succinate (TOPROL-XL) 25 MG 24 hr tablet Take 0.5 tablets (12.5 mg total) by mouth daily. 15 tablet 6   montelukast (SINGULAIR) 10 MG tablet Take 1 tablet (10 mg total) by mouth at bedtime. 90 tablet 2   Multiple Vitamins-Minerals (ALIVE WOMENS 50+ GUMMY) CHEW Chew 2 capsules by mouth daily.     nitroGLYCERIN (NITROSTAT) 0.4 MG SL tablet Place 1 tablet (0.4 mg total) under the tongue every 5 (five) minutes as needed for chest pain. 25 tablet 6   omeprazole (PRILOSEC OTC) 20 MG tablet Take 20 mg by mouth daily as needed (acid reflux).     OVER THE COUNTER MEDICATION Take 3 capsules by mouth daily before breakfast. [employer] Super Lysine Immune Support     OVER THE COUNTER MEDICATION Take 25 mg by mouth at bedtime as needed (sleep/joint pain). CBD oil (Patient not taking: Reported on 09/01/2021)     prednisoLONE acetate (PRED FORTE) 1 % ophthalmic suspension Place 1 drop into affected eyes. three times a day for 1 week then  twice a day  for one week then once daily for one week (Patient not taking: Reported on 09/01/2021) 10 mL 0   spironolactone (ALDACTONE) 25 MG tablet Take 12.5 mg by mouth daily.     No current facility-administered medications for this visit.  Musculoskeletal: Strength & Muscle Tone: Not able to assess due to virtual visit Gait & Station: Not able to assess due to virtual visit Patient leans: N/A   Psychiatric Specialty Exam: Review of Systems  There were no vitals taken for this visit.There is no height or weight on file to calculate BMI.  General Appearance: Casual  Eye Contact:  Fair  Speech:  Clear and Coherent and Normal Rate  Volume:  Normal  Mood:  anxious  Affect:  Congruent, full range  Thought Process:  Coherent and Linear  Orientation:  Full (Time, Place, and Person)  Thought Content: No SI, HI, AVH  Suicidal Thoughts:  No  Homicidal Thoughts:  No  Memory:  Immediate;   Good Recent;   Good Remote;   Good  Judgement:  Good  Insight:  Good  Psychomotor Activity:  Normal  Concentration:  Concentration: Fair and Attention Span: Fair  Recall:  Good  Fund of Knowledge:Good  Language: Good  Akathisia:  No  Handed:  Right  AIMS (if indicated):  not done  Assets:  Communication Skills Desire for [place] Talents/Skills Vocational/Educational  ADL's:  Intact  Cognition: WNL  Sleep: Fair  Screenings: GAD-7    Health and safety inspector from 08/24/2021 in [facility] Office Visit from 06/27/2021 in [place] from 05/22/2019 in [place] Office Visit from 03/19/2019 in [place] Office Visit from 03/27/2018 in [place]  Total GAD-7 Score '17 18 14 19 16      '$ PHQ2-9    Flowsheet Row Counselor from 09/05/2021 in [facility] Counselor from 08/25/2021 in [name] Counselor from 08/24/2021 in [facility] Office Visit from 06/27/2021 in [place] from 05/22/2019 in [place]  PHQ-2 Total Score '3 6 4 6 4  '$ PHQ-9 Total Score '14 20 20 26 18      '$ Flowsheet Row Counselor from 08/25/2021 in [facility] Counselor from 08/24/2021 in [facility] ED to Hosp-Admission (Discharged) from 05/30/2021 in [place] [facility]U  C-SSRS RISK CATEGORY Error: Q3, 4, or 5 should not be populated when Q2 is No  Error: Q3, 4, or 5 should not be populated when Q2 is No No Risk        Assessment and Plan: Patient is a 51 year old female with past psychiatric history of MDD, GAD presented to [facility] program for worsening depression and anxiety.  She started PHP program on 08/25/2021.  She reports improvement in her depression symptoms .Prozac was tapered and stopped and was started  on Wellbutrin. She has been tolerating Wellbutrin well. Reports some residual anxiety. Pt has some hydroxyzine which she will try.    MDD, recurrent, moderate episode -Prozac tapered and stopped. -Continue Wellbutrin XR 150 mg every morning. -Continue hydroxyzine 25 mg TID as needed for anxiety.  -Continue PHP and group therapy   Follow-up-we will follow-up next week Collaboration of Care: Other PHP team and Hogan therapist   Patient/Guardian was advised Release of Information must be obtained prior to any record release in order to collaborate their care with an outside provider. Patient/Guardian was advised if they have not already done so to contact the registration department to sign all necessary forms in order for [name]  to release information regarding their care.    Consent: Patient/Guardian gives verbal consent for treatment and assignment of benefits for services provided during this visit. Patient/Guardian expressed understanding and agreed to proceed.     Armando Reichert, MD PGY3 09/08/2021, 7:31 PM

## 2021-09-09 ENCOUNTER — Ambulatory Visit (INDEPENDENT_AMBULATORY_CARE_PROVIDER_SITE_OTHER): Payer: Medicaid Other | Admitting: Licensed Clinical Social Worker

## 2021-09-09 DIAGNOSIS — F332 Major depressive disorder, recurrent severe without psychotic features: Secondary | ICD-10-CM

## 2021-09-09 DIAGNOSIS — F411 Generalized anxiety disorder: Secondary | ICD-10-CM

## 2021-09-10 ENCOUNTER — Encounter (HOSPITAL_COMMUNITY): Payer: Self-pay

## 2021-09-12 ENCOUNTER — Ambulatory Visit (INDEPENDENT_AMBULATORY_CARE_PROVIDER_SITE_OTHER): Payer: Medicaid Other | Admitting: Licensed Clinical Social Worker

## 2021-09-12 ENCOUNTER — Other Ambulatory Visit: Payer: Self-pay

## 2021-09-12 ENCOUNTER — Ambulatory Visit (AMBULATORY_SURGERY_CENTER): Payer: Self-pay | Admitting: *Deleted

## 2021-09-12 VITALS — Ht 65.0 in | Wt 145.4 lb

## 2021-09-12 DIAGNOSIS — F332 Major depressive disorder, recurrent severe without psychotic features: Secondary | ICD-10-CM

## 2021-09-12 DIAGNOSIS — R112 Nausea with vomiting, unspecified: Secondary | ICD-10-CM

## 2021-09-12 DIAGNOSIS — F411 Generalized anxiety disorder: Secondary | ICD-10-CM

## 2021-09-12 DIAGNOSIS — R1013 Epigastric pain: Secondary | ICD-10-CM

## 2021-09-12 DIAGNOSIS — Z1211 Encounter for screening for malignant neoplasm of colon: Secondary | ICD-10-CM

## 2021-09-12 MED ORDER — NA SULFATE-K SULFATE-MG SULF 17.5-3.13-1.6 GM/177ML PO SOLN
1.0000 | Freq: Once | ORAL | 0 refills | Status: AC
  Filled 2021-09-12: qty 354, 2d supply, fill #0

## 2021-09-12 NOTE — Progress Notes (Signed)
  No trouble with anesthesia, denies being told they were difficult to intubate, or hx/fam hx of malignant hyperthermia per pt   No egg or soy allergy  No home oxygen use   No medications for weight loss taken  emmi information given  Pt denies constipation issues  Pt informed that we do not do prior authorizations for prep

## 2021-09-13 ENCOUNTER — Other Ambulatory Visit: Payer: Self-pay

## 2021-09-13 ENCOUNTER — Ambulatory Visit (INDEPENDENT_AMBULATORY_CARE_PROVIDER_SITE_OTHER): Payer: Medicaid Other | Admitting: Licensed Clinical Social Worker

## 2021-09-13 DIAGNOSIS — F411 Generalized anxiety disorder: Secondary | ICD-10-CM

## 2021-09-13 DIAGNOSIS — F332 Major depressive disorder, recurrent severe without psychotic features: Secondary | ICD-10-CM | POA: Diagnosis not present

## 2021-09-13 NOTE — Progress Notes (Signed)
Spoke with patient via Webex video call, used 2 identifiers to correctly identify patient. States that groups are going well, finds them very emotional and is connecting with people on a different level than before. Has some anxiety today due to continuous wait on disability. On scale 1-10 as 10 being worst she rates depression at 5 and anxiety at 6. Denies SI/HI or AV hallucinations. PHQ9=17.No side effects from medications. No issues or complaints.

## 2021-09-14 ENCOUNTER — Ambulatory Visit (HOSPITAL_COMMUNITY): Payer: Self-pay
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
## 2021-09-15 ENCOUNTER — Ambulatory Visit (HOSPITAL_COMMUNITY): Payer: Medicaid Other | Admitting: Student

## 2021-09-15 ENCOUNTER — Ambulatory Visit (HOSPITAL_COMMUNITY): Payer: Self-pay

## 2021-09-16 ENCOUNTER — Ambulatory Visit (INDEPENDENT_AMBULATORY_CARE_PROVIDER_SITE_OTHER): Payer: Medicaid Other | Admitting: Licensed Clinical Social Worker

## 2021-09-16 DIAGNOSIS — F332 Major depressive disorder, recurrent severe without psychotic features: Secondary | ICD-10-CM

## 2021-09-16 DIAGNOSIS — F411 Generalized anxiety disorder: Secondary | ICD-10-CM

## 2021-09-19 ENCOUNTER — Ambulatory Visit (INDEPENDENT_AMBULATORY_CARE_PROVIDER_SITE_OTHER): Payer: Medicaid Other | Admitting: Licensed Clinical Social Worker

## 2021-09-19 ENCOUNTER — Other Ambulatory Visit: Payer: Self-pay

## 2021-09-19 DIAGNOSIS — F332 Major depressive disorder, recurrent severe without psychotic features: Secondary | ICD-10-CM | POA: Diagnosis not present

## 2021-09-19 DIAGNOSIS — F411 Generalized anxiety disorder: Secondary | ICD-10-CM

## 2021-09-19 NOTE — Progress Notes (Signed)
Spoke with patient via Webex video call, used 2 identifiers to correctly identify patient. States that groups are good and she has enjoyed them. She is having trouble finding a trauma therapist that takes [facility]. Message sent to Sonia Baller to let her know for possible recommendations. On scale 1-10 as 10 being worst she rates depression at 4 and anxiety at 6. Denies SI/HI or AV hallucinations. No issues or complaints. No side effects from medications.

## 2021-09-20 ENCOUNTER — Telehealth (HOSPITAL_COMMUNITY): Payer: Self-pay | Admitting: Vascular Surgery

## 2021-09-20 ENCOUNTER — Telehealth: Payer: Self-pay | Admitting: Internal Medicine

## 2021-09-20 ENCOUNTER — Ambulatory Visit (INDEPENDENT_AMBULATORY_CARE_PROVIDER_SITE_OTHER): Payer: Medicaid Other | Admitting: Licensed Clinical Social Worker

## 2021-09-20 DIAGNOSIS — F332 Major depressive disorder, recurrent severe without psychotic features: Secondary | ICD-10-CM | POA: Diagnosis not present

## 2021-09-20 DIAGNOSIS — F411 Generalized anxiety disorder: Secondary | ICD-10-CM

## 2021-09-20 NOTE — Telephone Encounter (Signed)
Patient was on a 3 way call with  Norwood Levo with Medicaid and this office.  Patient stated that she has been trying to get the Certified Medical Necessity (CMN) form filled out correctly for over 1 month and so she called Medicaid to get their assistance with the telephone call.  Patient stated that Ms. Edmonia Lynch would explain what needs to be corrected on the CMN form.  Norwood Levo # is [phone]  Minette Brine states that the list is: Durable Equipment [place]erv[place] Infusion Site Test Strips Glucose Monitoring

## 2021-09-20 NOTE — Progress Notes (Unsigned)
Psychiatric Initial Adult Assessment  Patient Identification: Jennifer Hogan MRN:  [id number] Date of Evaluation:  09/21/2021 Referral Source: [name], NP  Assessment:  Jennifer Hogan is a 51 y.o. y.o. female with a history of MDD, GAD, childhood trauma, T1DM with diabetic retinopathy, CHF Stage 3, CAD, and hypothyroidism who presents by video to [facility] for evaluation of depression and anxiety after discharge from [facility] partial hospitalization program yesterday (participated 08/25/21-09/20/21).  Patient reports significant benefit from [facility] program for mood and anxiety and demonstrates substantial insight on exam to contributors to mental health symptoms.  Patient reports adherence to medications and denies side effects.  No acute safety concern today.  Expresses desire for continued engagement in psychotherapy and this writer will reach out to therapist to ensure she has next appointment scheduled.  No changes to plan of care today and will send necessary refills.  Plan to return to care in 3 weeks.  Plan:  # Major depressive disorder  Generalized anxiety disorder Past medication trials: Prozac 40 mg daily (ineffective), Effexor XR Status of problem: improving Interventions: -- Continue Wellbutrin XL 150 mg daily (s8/8/23) -- Continue therapy with Moses Manners, LCSWA at [facility] -- Has prescription for Atarax 25 mg but has not had to use  # Cannabis use Status of problem: Improving Interventions: -- Continue to monitor use and provide psychoeducation  # History of alcohol use Past medication trials: None Status of problem: In remission Interventions: -- Patient reports social use but denies risky or escalating drinking behaviors; continue to monitor and support sobriety  Patient was given contact information for behavioral health clinic and was instructed to call 911 for emergencies.   Subjective:  Chief Complaint:  Chief  Complaint  Patient presents with   Depression   Anxiety   History of Present Illness:   Patient reports she completed a PhD in medicinal biochemistry in Apr 12, 2014 - describes self as highly intelligent but also highly emotional. First episode of depression was at 51 yo which occurred when she was demoted and notes this was a major blow to her self worth. Began questioning herself.   Describes that in her youth she demonstrated highly sexual and impulsive behaviors such as drinking - attributes this to personality characteristics. Describes mother as a malignant narcissist. When her dad passed away in 04/12/18, mother took out a lot of emotions on her. Has realized impact mother has had on her way of relating to self and others. Realized a lot of depression/anxiety has stemmed from denial and not living in line with personal values. Describes herself as traditionally a Herbalist but has been learning techniques to assert herself.   States that while in [place] she came to the realization that she sets high expectations for herself and can be too hard on herself. Asked herself "What would be enough?" And realized she often diminishes her own accomplishments.  Was started on Wellbutrin while in PHP and titrated off Prozac.  Endorses adherence to Wellbutrin and denies side effects. Feels that combination of PHP groups and medication have been helpful - notes improvement in energy and motivation. Tolerated discontinuation of Prozac well. Has history of panic attacks although denies recently.   Endorses occasional negative thoughts and guilt that lead to passive SI but has been able to push back against these thoughts. Denies active SI - identifies fear of pain and responsibility towards horses and cats are major deterrents to harming herself. Denies access to gun.   Support  system is Oncologist and her husband although notes some issues in this dynamic as well.   Has set a goal to write a book about her  experiences.  Past Psychiatric History:  Diagnoses: depression, anxiety Suicide attempts: denies Hospitalizations: denies Therapy: yes; past in 2001 and currently  Previous Psychotropic Medications: Yes   Substance Abuse History in the last 12 months:  Yes.   Endorses current use of cannabis although frequency/amount has been decreasing and notes desire to smoke has been decreasing - last use 09/16/21  ** smokes for soporific effects; denies use while driving Endorses occasional use of etoh - typically social.   Past use of heavy alcohol use resulting in 2 DWIs in 2005 which prompted move from [place] to [place] to be closer to family. Attended a mandated 2 week outpatient recovery program.   Consequences of Substance Abuse: Legal Consequences:  history of 2 DUIs in 2005  Past Medical History:  Past Medical History:  Diagnosis Date   Allergy    Anemia    Anxiety    Asthma    Cataract    CHF (congestive heart failure) (HCC)    Coronary artery disease    a. s/p PTCA DES x3 in the LAD (ostial DES placed, mid DES placed, distal DES placed) and PTCA/DES x1 to intermediate branch 02/2017. b. ACS 02/08/18 with LCx stent placement.   Depression    Diabetes mellitus    type 1, patient is not using her insulin pump.   DKA (diabetic ketoacidoses) 12/31/2017   Dyspnea    with exertion and dust, no oxygen   Elevated platelet count    GERD (gastroesophageal reflux disease)    Heart murmur    High cholesterol    Hypothyroidism    Ischemic cardiomyopathy    a. EF low-normal with basal inferior akinesis, basal septal hypokinesis by echo 01/2018.   Myocardial infarction ([place]) 2019   Substance abuse ([place])     Past Surgical History:  Procedure Laterality Date   APPENDECTOMY     COLONOSCOPY     CORONARY ARTERY BYPASS GRAFT N/A 11/04/2018   Procedure: CORONARY ARTERY BYPASS GRAFTING (CABG), ON PUMP, TIMES THREE, USING LEFT AND RIGHT INTERNAL MAMMARY ARTERIES;  Surgeon: Wonda Olds, MD;   Location: [facility];  Service: Open Heart Surgery;  Laterality: N/A;   CORONARY STENT INTERVENTION N/A 03/20/2017   Procedure: DES x 3 LAD, DES OM; Surgeon: Burnell Blanks, MD;  Location: [place] CV [facility];  Service: Cardiovascular;  Laterality: N/A;   CORONARY/GRAFT ACUTE MI REVASCULARIZATION N/A 02/08/2018   Procedure: Coronary/Graft Acute MI Revascularization;  Surgeon: Belva Crome, MD;  Location: [place] [facility];  Service: Cardiovascular;  Laterality: N/A;   GAS INSERTION Right 04/28/2021   Procedure: INSERTION OF GAS ( C3F8);  Surgeon: Jalene Mullet, MD;  Location: [facility];  Service: Ophthalmology;  Laterality: Right;  Right eye   GAS/FLUID EXCHANGE Right 04/28/2021   Procedure: GAS/FLUID EXCHANGE;  Surgeon: Jalene Mullet, MD;  Location: [place];  Service: Ophthalmology;  Laterality: Right;  Right eye   LEFT HEART CATH AND CORONARY ANGIOGRAPHY N/A 03/20/2017   Procedure: LEFT HEART CATH AND CORONARY ANGIOGRAPHY;  Surgeon: Burnell Blanks, MD;  Location: [place] [facility];  Service: Cardiovascular;  Laterality: N/A;   LEFT HEART CATH AND CORONARY ANGIOGRAPHY N/A 02/08/2018   Procedure: LEFT HEART CATH AND CORONARY ANGIOGRAPHY;  Surgeon: Belva Crome, MD;  Location: [place] [facility];  Service: Cardiovascular;  Laterality: N/A;  LEFT HEART CATH AND CORONARY ANGIOGRAPHY N/A 10/29/2018   Procedure: LEFT HEART CATH AND CORONARY ANGIOGRAPHY;  Surgeon: Burnell Blanks, MD;  Location: [place] [facility];  Service: Cardiovascular;  Laterality: N/A;   LEFT HEART CATH AND CORS/GRAFTS ANGIOGRAPHY N/A 10/04/2020   Procedure: LEFT HEART CATH AND CORS/GRAFTS ANGIOGRAPHY;  Surgeon: Sherren Mocha, MD;  Location: [place] [facility];  Service: Cardiovascular;  Laterality: N/A;   LEFT HEART CATH AND CORS/GRAFTS ANGIOGRAPHY N/A 05/11/2021   Procedure: LEFT HEART CATH AND CORS/GRAFTS ANGIOGRAPHY;  Surgeon: Martinique, Peter M, MD;  Location: [place] [facility];  Service:  Cardiovascular;  Laterality: N/A;   PARS PLANA VITRECTOMY Right 04/28/2021   Procedure: PARS PLANA VITRECTOMY WITH 25 GAUGE REMOVAL OF TRATIONAL MEMBRANE RIGHT EYE; DRAINAGE OF SUBRETINAL FLUID RIGHT EYE;  Surgeon: Jalene Mullet, MD;  Location: [place];  Service: Ophthalmology;  Laterality: Right;  Right eye   PHOTOCOAGULATION WITH LASER Right 04/28/2021   Procedure: PHOTOCOAGULATION WITH LASER;  Surgeon: Jalene Mullet, MD;  Location: [facility];  Service: Ophthalmology;  Laterality: Right;  Right eye   TEE WITHOUT CARDIOVERSION N/A 11/04/2018   Procedure: TRANSESOPHAGEAL ECHOCARDIOGRAM (TEE);  Surgeon: Wonda Olds, MD;  Location: [facility];  Service: Open Heart Surgery;  Laterality: N/A;    Family Psychiatric History: Father: etoh use disorder, anxiety Grandmother: depression, was treated with ECT later in her life  Family History:  Family History  Problem Relation Age of Onset   Cancer Mother        T cell lymphoma   Hypertension Mother    Hypercalcemia Mother    OCD Father    Anxiety disorder Father    Coronary artery disease Father    Congestive Heart Failure Father    Asthma Father    Hypercalcemia Father    Hypertension Father    Colon polyps Father    Depression Maternal Grandmother    Diabetes Paternal Grandmother    Stomach cancer Neg Hx    Esophageal cancer Neg Hx    Colon cancer Neg Hx    Rectal cancer Neg Hx     Social History:   Social History   Socioeconomic History   Marital status: Divorced    Spouse name: Not on file   Number of children: 0   Years of education: Not on file   Highest education level: Professional school degree (e.g., MD, DDS, DVM, JD)  Occupational History   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"   Occupation: adjunct facilty at [employer]    Comment: Chemistry professor  Tobacco Use   Smoking status: Former    Types: Cigarettes   Smokeless tobacco: Never   Tobacco comments:    Smoked cigarettes in her 20's for 1 year  Vaping Use    Vaping Use: Never used  Substance and Sexual Activity   Alcohol use: Yes    Comment: Socially   Drug use: Yes    Types: Marijuana    Comment: 09/16/21   Sexual activity: Not Currently    Birth control/protection: None  Other Topics Concern   Not on file  Social History Narrative   Not on file   Social Determinants of Health   Financial Resource Strain: High Risk (10/05/2020)   Overall Financial Resource Strain (CARDIA)    Difficulty of Paying Living Expenses: Very hard  Food Insecurity: Food Insecurity Present (10/05/2020)   Hunger Vital Sign    Worried About Running Out of Food in the Last Year: Sometimes true  Ran Out of Food in the Last Year: Sometimes true  Transportation Needs: No Transportation Needs (10/05/2020)   PRAPARE - Hydrologist (Medical): No    Lack of Transportation (Non-Medical): No  Physical Activity: Not on file  Stress: Not on file  Social Connections: Unknown (12/07/2017)   Social Connection and Isolation Panel [NHANES]    Frequency of Communication with Friends and Family: Patient refused    Frequency of Social Gatherings with Friends and Family: Patient refused    Attends Religious Services: Patient refused    Marine scientist or Organizations: Patient refused    Attends Archivist Meetings: Patient refused    Marital Status: Patient refused    Additional Social History: updated  Allergies:   Allergies  Allergen Reactions   Atorvastatin Other (See Comments)    Myalgias on '80mg'$  dose, tolerates '20mg'$  ok   Crestor [Rosuvastatin Calcium] Other (See Comments)    Severe myalgias and joint pain. Has tolerated atorvastatin though   Monosodium Glutamate Nausea And Vomiting and Other (See Comments)    migraine   Zetia [Ezetimibe] Other (See Comments)    myalgias   Ciprofloxin Hcl [Ciprofloxacin] Nausea And Vomiting and Other (See Comments)    Severe migraine   Food Color Red [Red Dye] Diarrhea    Current  Medications: Current Outpatient Medications  Medication Sig Dispense Refill   buPROPion (WELLBUTRIN XL) 150 MG 24 hr tablet Take 1 tablet (150 mg total) by mouth in the morning. 30 tablet 2   acetaminophen (TYLENOL) 325 MG tablet Take 325-650 mg by mouth daily as needed for mild pain, fever or headache.     albuterol (VENTOLIN HFA) 108 (90 Base) MCG/ACT inhaler INHALE 2 PUFFS INTO THE LUNGS EVERY 6 (SIX) HOURS AS NEEDED FOR WHEEZING OR SHORTNESS OF BREATH. 8.5 g 2   aspirin 81 MG EC tablet Take 1 tablet (81 mg total) by mouth daily. 90 tablet 0   atorvastatin (LIPITOR) 20 MG tablet Take 1 tablet (20 mg total) by mouth daily. (Patient not taking: Reported on 09/05/2021) 30 tablet 0   b complex vitamins tablet Take 1 tablet by mouth daily.     brimonidine (ALPHAGAN) 0.15 % ophthalmic solution Place 1 drop into the right eye 2 (two) times daily. (Patient not taking: Reported on 09/13/2021) 5 mL 0   budesonide (PULMICORT) 0.25 MG/2ML nebulizer solution Take 0.25 mg by nebulization as needed (shortness of breath/wheezing).     clopidogrel (PLAVIX) 75 MG tablet TAKE 1 TABLET BY MOUTH DAILY WITH BREAKFAST. 90 tablet 3   diclofenac Sodium (VOLTAREN) 1 % GEL Apply 2-4 g topically as needed (knee pain).     diphenhydrAMINE (BENADRYL) 25 MG tablet Take 25 mg by mouth at bedtime.     fluticasone (FLONASE) 50 MCG/ACT nasal spray Place 1 spray into both nostrils at bedtime as needed for allergies.     furosemide (LASIX) 20 MG tablet Take 1 tablet (20 mg total) by mouth daily as needed (for weight gain). 15 tablet 3   Glucagon 3 MG/DOSE POWD Place 3 mg into the nose once as needed for up to 1 dose. (Patient not taking: Reported on 08/25/2021) 1 each 11   hydrOXYzine (ATARAX/VISTARIL) 25 MG tablet Take 1 tablet (25 mg total) by mouth 3 (three) times daily as needed. (Patient not taking: Reported on 08/25/2021) 60 tablet 1   insulin lispro (HUMALOG) 100 UNIT/ML injection Inject 35 units into the skin daily via pump 10 mL  1   levothyroxine (SYNTHROID) 100 MCG tablet TAKE 1 TABLET (100 MCG TOTAL) BY MOUTH DAILY BEFORE BREAKFAST. 90 tablet 3   metoCLOPramide (REGLAN) 5 MG tablet Take 1 tablet (5 mg total) by mouth every 12 (twelve) hours as needed for nausea or vomiting. 60 tablet 3   metoprolol succinate (TOPROL-XL) 25 MG 24 hr tablet Take 0.5 tablets (12.5 mg total) by mouth daily. 15 tablet 6   montelukast (SINGULAIR) 10 MG tablet Take 1 tablet (10 mg total) by mouth at bedtime. 90 tablet 2   Multiple Vitamins-Minerals (ALIVE WOMENS 50+ GUMMY) CHEW Chew 2 capsules by mouth daily.     nitroGLYCERIN (NITROSTAT) 0.4 MG SL tablet Place 1 tablet (0.4 mg total) under the tongue every 5 (five) minutes as needed for chest pain. 25 tablet 6   omeprazole (PRILOSEC OTC) 20 MG tablet Take 20 mg by mouth daily as needed (acid reflux).     OVER THE COUNTER MEDICATION Take 3 capsules by mouth daily before breakfast. [employer] Super Lysine Immune Support     OVER THE COUNTER MEDICATION Take 25 mg by mouth at bedtime as needed (sleep/joint pain). CBD oil (Patient not taking: Reported on 09/01/2021)     prednisoLONE acetate (PRED FORTE) 1 % ophthalmic suspension Place 1 drop into affected eyes. three times a day for 1 week then  twice a day  for one week then once daily for one week (Patient not taking: Reported on 09/01/2021) 10 mL 0   spironolactone (ALDACTONE) 25 MG tablet Take 12.5 mg by mouth daily. Not started yet- will start on 09-15-21     No current facility-administered medications for this visit.    ROS: Review of Systems  Constitutional:  Negative for activity change, appetite change and fatigue.    Objective:  Psychiatric Specialty Exam: There were no vitals taken for this visit.There is no height or weight on file to calculate BMI.  General Appearance: Casual and Fairly Groomed  Eye Contact:  Good  Speech:  Clear and Coherent and Normal Rate  Volume:  Normal  Mood:   "better"  Affect:  Full Range and  euthymic and engaged  Thought Process:  Coherent and Linear  Orientation:  Full (Time, Place, and Person)  Thought Content:  Logical and denies hallucinations and no overt delusional content on interview  Suicidal Thoughts:   Endorses passive SI but denies active SI  Homicidal Thoughts:  No  Memory:   Grossly intact  Judgment:  Good  Insight:  Good  Psychomotor Activity:  Normal  Concentration:  Concentration: Grossly intact to interview  Recall:  NA  Fund of Knowledge:Good  Language: Good  Akathisia:  NA  Handed: n/a  AIMS (if indicated):  not done  Assets:  Communication Skills Desire for Improvement Financial Resources/Insurance Housing Resilience Talents/Skills  ADL's:  Intact  Cognition: WNL  Sleep:  Good   PE: General: well-appearing; no acute distress  Pulm: no increased work of breathing on room air  Strength & Muscle Tone: Spontaneously moving all extremities Neuro: no focal neurological deficits observed  Gait & Station: Unable to assess  Metabolic Disorder Labs: Lab Results  Component Value Date   HGBA1C 8.0 (A) 07/01/2021   MPG 197.25 10/01/2020   MPG 231.69 03/15/2019   No results found for: "PROLACTIN" Lab Results  Component Value Date   CHOL 298 (H) 08/15/2021   TRIG 111 08/15/2021   HDL 76 08/15/2021   CHOLHDL 3.9 08/15/2021   VLDL 13 05/11/2021   LDLCALC  203 (H) 08/15/2021   LDLCALC 167 (H) 05/11/2021   Lab Results  Component Value Date   TSH 2.505 05/15/2021    Therapeutic Level Labs: No results found for: "LITHIUM" No results found for: "CBMZ" No results found for: "VALPROATE"  Screenings:  GAD-7    Flowsheet Row Counselor from 08/24/2021 in [facility] Office Visit from 06/27/2021 in [place] from 05/22/2019 in [place] Office Visit from 03/19/2019 in [place] Office Visit from  03/27/2018 in [place]  Total GAD-7 Score '17 18 14 19 16      '$ PHQ2-9    [place] from 09/13/2021 in [facility] Counselor from 09/05/2021 in [facility] Counselor from 08/25/2021 in [facility] Counselor from 08/24/2021 in [facility] Office Visit from 06/27/2021 in [place]  PHQ-2 Total Score '3 3 6 4 6  '$ PHQ-9 Total Score '17 14 20 20 26      '$ Flowsheet Row Counselor from 08/25/2021 in [facility] Counselor from 08/24/2021 in [facility] ED to Hosp-Admission (Discharged) from 05/30/2021 in [place] HF PCU  C-SSRS RISK CATEGORY Error: Q3, 4, or 5 should not be populated when Q2 is No Error: Q3, 4, or 5 should not be populated when Q2 is No No Risk       Collaboration of Care: Collaboration of Care: Medication Management AEB continued medication management, Psychiatrist AEB seen by this writer, and Other provider involved in patient's care AEB seen by individual psychotherapist  Patient/Guardian was advised Release of Information must be obtained prior to any record release in order to collaborate their care with an outside provider. Patient/Guardian was advised if they have not already done so to contact the registration department to sign all necessary forms in order for [name] to release information regarding their care.   Consent: Patient/Guardian gives verbal consent for treatment and assignment of benefits for services provided during this visit. Patient/Guardian expressed understanding and agreed to proceed.    Virtual Visit via Video Note  I connected with Jennifer Hogan on 09/21/21 at  8:00 AM EDT by a video enabled telemedicine application and verified that I am speaking with the correct person using two  identifiers.  Location: Patient: At home in [facility] Provider: Clinic   I discussed the limitations of evaluation and management by telemedicine and the availability of in person appointments. The patient expressed understanding and agreed to proceed.   I discussed the assessment and treatment plan with the patient. The patient was provided an opportunity to ask questions and all were answered. The patient agreed with the plan and demonstrated an understanding of the instructions.   The patient was advised to call back or seek an in-person evaluation if the symptoms worsen or if the condition fails to improve as anticipated.  I provided 45 minutes of non-face-to-face time during this encounter.  Arleta Ostrum A  8/30/202310:49 AM

## 2021-09-20 NOTE — Progress Notes (Cosign Needed Addendum)
  [facility] Program Discharge Summary  Jennifer Hogan [id number] Virtual Visit via Video Note  I connected with Jennifer Hogan on 09/20/21 at  9:05 AM EDT by a video enabled telemedicine application and verified that I am speaking with the correct person using two identifiers.  Location: Patient: home Provider: Clinic   I discussed the limitations of evaluation and management by telemedicine and the availability of in person appointments. The patient expressed understanding and agreed to proceed.   I discussed the assessment and treatment plan with the patient. The patient was provided an opportunity to ask questions and all were answered. The patient agreed with the plan and demonstrated an understanding of the instructions.   The patient was advised to call back or seek an in-person evaluation if the symptoms worsen or if the condition fails to improve as anticipated.  I provided 15 minutes of non-face-to-face time during this encounter.   Jennifer Reichert, MD  Admission date: 08/25/2021 Discharge date: 09/20/21  Reason for admission: worsening depression and anxiety  Chemical Use History:  Alcohol: Denies.  Previously drinking.  Illicit drugs-marijuana multiple times in a week Rehab QJ:[id number] Seizures, DUI's, DT's-2 DUIs, no seizures, no DTs Family of Origin Issues: Psych: Dad-alcoholic, anxiety Grandmother -depression, was treated with ECT later in her life SA/HA: Denies  Progress in Program Toward Treatment Goals: Progressing  Progress (rationale): Patient is seen today.  Patient reports that she had a rough weekend due to her cat passing away.  Overall, she reports improvement in her depression and anxiety since starting PHP program. Currently, she denies active SI, HI, and AVH.  She reports that sometimes she does have negative thoughts off and on about her past and thinks " I wish I did something different", and " why I am not performing better".   Discussed interrupting negative thoughts, analyzing it and replacing it with positive thoughts.  Patient has been sleeping and eating well.  She has been tolerating her medications well without any side effects.  Patient will be discharged today from [facility] program.  Pt will continue her medications as prescribed. Patient will follow-up with Jennifer. Laddie Hogan at [facility] outpatient clinic for medication management.  Patient has a virtual appointment with Jennifer Hogan on 09/21/2021 at 8 AM.   Collaboration of Care: Psychiatrist AEB OP psychiatrist Jennifer Hogan and [facility] team  Patient/Guardian was advised Release of Information must be obtained prior to any record release in order to collaborate their care with an outside provider. Patient/Guardian was advised if they have not already done so to contact the registration department to sign all necessary forms in order for [name] to release information regarding their care.   Consent: Patient/Guardian gives verbal consent for treatment and assignment of benefits for services provided during this visit. Patient/Guardian expressed understanding and agreed to proceed.   Jennifer Reichert, MD PGY3 Psychiatry Resident  Jennifer Hogan   09/20/21 10.43 am

## 2021-09-20 NOTE — Telephone Encounter (Signed)
LVM GIVING F/U APPT W/ DB , ASKED PT TO CALL BACK TO CONFIRM

## 2021-09-20 NOTE — Patient Instructions (Signed)
Thank you for attending your appointment today.  -- We did not make any medication changes today. Please continue medications as prescribed.  Please do not make any changes to medications without first discussing with your provider. If you are experiencing a psychiatric emergency, please call 911 or present to your nearest emergency department. Additional crisis, medication management, and therapy resources are included below.  [facility]  [address], [place], [place] [address] [phone] or [phone] WALK-IN URGENT CARE 24/7 FOR ANYONE [address], [place], [place]  [phone] Fax: [phone] [email or web address] *Interpreters available *Accepts all insurance and uninsured for Urgent Care needs *Accepts Medicaid and uninsured for outpatient treatment (below)      ONLY FOR [place] Residents  Below:    Outpatient New Patient Assessment/Therapy Walk-ins:        Monday -Thursday 8am until slots are full.        Every Friday 1pm-4pm  (first come, first served)                   New Patient Psychiatry/Medication Management        Monday-Friday 8am-11am (first come, first served)               For all walk-ins we ask that you arrive by 7:15am, because patients will be seen in the order of arrival.   

## 2021-09-21 ENCOUNTER — Ambulatory Visit (INDEPENDENT_AMBULATORY_CARE_PROVIDER_SITE_OTHER): Payer: Medicaid Other | Admitting: Psychiatry

## 2021-09-21 ENCOUNTER — Encounter (HOSPITAL_COMMUNITY): Payer: Self-pay | Admitting: Gastroenterology

## 2021-09-21 ENCOUNTER — Other Ambulatory Visit: Payer: Self-pay

## 2021-09-21 ENCOUNTER — Ambulatory Visit (HOSPITAL_COMMUNITY): Payer: Self-pay

## 2021-09-21 ENCOUNTER — Encounter (HOSPITAL_COMMUNITY): Payer: Self-pay | Admitting: Psychiatry

## 2021-09-21 DIAGNOSIS — F411 Generalized anxiety disorder: Secondary | ICD-10-CM

## 2021-09-21 DIAGNOSIS — F332 Major depressive disorder, recurrent severe without psychotic features: Secondary | ICD-10-CM

## 2021-09-21 DIAGNOSIS — F331 Major depressive disorder, recurrent, moderate: Secondary | ICD-10-CM

## 2021-09-21 MED ORDER — BUPROPION HCL ER (XL) 150 MG PO TB24
150.0000 mg | ORAL_TABLET | Freq: Every morning | ORAL | 2 refills | Status: DC
  Filled 2021-09-21 (×3): qty 30, 30d supply, fill #0

## 2021-09-21 NOTE — Progress Notes (Signed)
Attempted to obtain medical history via telephone, unable to reach at this time. HIPAA compliant voicemail message left requesting return call to pre surgical testing department. 

## 2021-09-21 NOTE — Telephone Encounter (Signed)
Norwood Levo with Medicaid called again to say that the patient is almost out of supplies and needs this taken care of as soon as possible.

## 2021-09-21 NOTE — Progress Notes (Signed)
[name]                 DOB: 1970-12-07                    MRN: [id number]      HPI: [name] is a 51 y.o. female patient of Dr. Radford Pax referred to lipid clinic by Christen Bame, NP. PMH is significant for CAD s/p CABG 11/2018, type 1 DM on insulin pump, hypothyroidism, chronic HFrEF, dilated cardiomyopathy with LVEF 45-50% with apical hypokinesis. Pt had STEMI in January 2020 and underwent stenting of ostial proxmal RCS with residual moderate disease in the LAD between stents and an ostial ramus branch treated medically. On 10/29/2018 underwent CABG with LIMA-LAD and RI, RIMA-OM. 09/24/20 admitted for NSTEMI and LHC showed severe CAD with total occlusion of proximal LAD, moderate stenosis of intermediate branch, and severe ostial circumflex ISR with patency of both the RIMA to left circumflex and LAD sequential to intermediate grafts. Admitted 05/10/21 with elevated troponin and repeat cath 4/19 with patent grafts, distal LAD and mild RCA disease, not significantly worse. Demand ischemia or spasm suspected, and was discharged on atorvastatin 20 mg daily.  At f/u with Sharyn Lull pt reports did not tolerate atorvastatin. Pt has taken praluent in the past and is agreeable to retry.   Pt presents today to lipid clinic. Pt shares that she did not tolerate the lower dose of atorvastatin, and felt like she was "hit by a truck." She did say that she has tolerated Praluent in the past. She confirms stopping at about the time that she lost her job, last dose back in November 2021. She was taking Praluent 150 mg [place] every 14 days. She shares that he is depressed since losing her job completely in January. She has not been moving as much especially with it being so hot this summer, but she is not opposed to working on getting more physical activity in. Pt generally eats healthy with lean proteins such as chicken and fish, but uses butter sparingly and likes eating peanut butter and apples for  a snack.    Current Medications: no LLT  Intolerances: atorvastatin (myalgias), ezetimibe (myalgias), rosuvastatin (severe myalgias and joint pain)  Risk Factors: CAD s/p multiple MIs, PCIs, and CABG, DM, family history LDL goal: <55  Diet: Cooks with and eats a lot of butter. Eats mainly fish, but has steak occasionally. Fruits, frozen vegetables, riced broccoli or cauiliflower. Limited foods due to stomach sensitivity.   Does not eat red meat Is using butter sparingly  Eats mainly chicken, fish (canned, salmon patties)  Snack - peanut butter + apples   Exercise:  Walks around 2 acres of land most days of the week. Is planning on doing yoga inside her home   Family History: The patient's family history includes Asthma in her father; Cancer and high cholesterol in her mother; Congestive Heart Failure and heart disease on pacemaker in her father; Coronary artery disease in her father. Grandmother had congestive heart disease  Social History: former smoker, no current alcohol consumption   Labs: 08/15/21 - TC 298, TG 111, HDL 76, LDL 203 (no LLT) 01/08/19- TC 173, TG 91, HDL 93, LDL 103 (atorvastatin 80 mg - adherence issues) 07/23/18- TC 215, TG 157, HDL 63, LDL 121 (no LLT)   Past Medical History:  Diagnosis Date   Allergy    Anemia    Anxiety    Asthma    Cataract  CHF (congestive heart failure) (HCC)    Coronary artery disease    a. s/p PTCA DES x3 in the LAD (ostial DES placed, mid DES placed, distal DES placed) and PTCA/DES x1 to intermediate branch 02/2017. b. ACS 02/08/18 with LCx stent placement.   Depression    Diabetes mellitus    type 1, patient is not using her insulin pump.   DKA (diabetic ketoacidoses) 12/31/2017   Dyspnea    with exertion and dust, no oxygen   Elevated platelet count    GERD (gastroesophageal reflux disease)    Heart murmur    High cholesterol    Hypothyroidism    Ischemic cardiomyopathy    a. EF low-normal with basal inferior akinesis,  basal septal hypokinesis by echo 01/2018.   Myocardial infarction ([place]) 2019   Substance abuse ([place])     Current Outpatient Medications on File Prior to Visit  Medication Sig Dispense Refill   acetaminophen (TYLENOL) 325 MG tablet Take 650 mg by mouth daily as needed for mild pain, fever or headache.     albuterol (VENTOLIN HFA) 108 (90 Base) MCG/ACT inhaler INHALE 2 PUFFS INTO THE LUNGS EVERY 6 (SIX) HOURS AS NEEDED FOR WHEEZING OR SHORTNESS OF BREATH. 8.5 g 2   aspirin 81 MG EC tablet Take 1 tablet (81 mg total) by mouth daily. 90 tablet 0   b complex vitamins tablet Take 1 tablet by mouth 3 (three) times a week.     budesonide (PULMICORT) 0.25 MG/2ML nebulizer solution Take 0.25 mg by nebulization as needed (shortness of breath/wheezing).     buPROPion (WELLBUTRIN XL) 150 MG 24 hr tablet Take 1 tablet (150 mg total) by mouth in the morning. 30 tablet 2   clopidogrel (PLAVIX) 75 MG tablet TAKE 1 TABLET BY MOUTH DAILY WITH BREAKFAST. 90 tablet 3   diclofenac Sodium (VOLTAREN) 1 % GEL Apply 2-4 g topically as needed (knee pain).     diphenhydrAMINE (BENADRYL) 25 MG tablet Take 37.5 mg by mouth at bedtime.     fluticasone (FLONASE) 50 MCG/ACT nasal spray Place 1 spray into both nostrils at bedtime as needed for allergies.     furosemide (LASIX) 20 MG tablet Take 1 tablet (20 mg total) by mouth daily as needed (for weight gain). 15 tablet 3   Glucagon 3 MG/DOSE POWD Place 3 mg into the nose once as needed for up to 1 dose. 1 each 11   hydrOXYzine (ATARAX/VISTARIL) 25 MG tablet Take 1 tablet (25 mg total) by mouth 3 (three) times daily as needed. (Patient taking differently: Take 25 mg by mouth daily as needed for anxiety.) 60 tablet 1   insulin lispro (HUMALOG) 100 UNIT/ML injection Inject 35 units into the skin daily via pump 10 mL 1   levothyroxine (SYNTHROID) 100 MCG tablet TAKE 1 TABLET (100 MCG TOTAL) BY MOUTH DAILY BEFORE BREAKFAST. 90 tablet 3   metoCLOPramide (REGLAN) 5 MG tablet Take 1  tablet (5 mg total) by mouth every 12 (twelve) hours as needed for nausea or vomiting. 60 tablet 3   metoprolol succinate (TOPROL-XL) 25 MG 24 hr tablet Take 0.5 tablets (12.5 mg total) by mouth daily. 15 tablet 6   montelukast (SINGULAIR) 10 MG tablet Take 1 tablet (10 mg total) by mouth at bedtime. 90 tablet 2   Multiple Vitamin (MULTIVITAMIN WITH MINERALS) TABS tablet Take 1 tablet by mouth daily.     nitroGLYCERIN (NITROSTAT) 0.4 MG SL tablet Place 1 tablet (0.4 mg total) under the tongue every 5 (  five) minutes as needed for chest pain. 25 tablet 6   omeprazole (PRILOSEC OTC) 20 MG tablet Take 20 mg by mouth daily as needed (acid reflux).     OVER THE COUNTER MEDICATION Take 3 capsules by mouth daily before breakfast. [employer] Super Lysine Immune Support     spironolactone (ALDACTONE) 25 MG tablet Take 12.5 mg by mouth daily.     No current facility-administered medications on file prior to visit.    Allergies  Allergen Reactions   Atorvastatin Other (See Comments)    Myalgias    Crestor [Rosuvastatin Calcium] Other (See Comments)    Severe myalgias and joint pain. Has tolerated atorvastatin though   Monosodium Glutamate Nausea And Vomiting and Other (See Comments)    migraine   Zetia [Ezetimibe] Other (See Comments)    myalgias   Ciprofloxin Hcl [Ciprofloxacin] Nausea And Vomiting and Other (See Comments)    Severe migraine   Food Color Red [Red Dye] Diarrhea    Assessment/Plan:  1. Hyperlipidemia - LDL of 203 above goal of <55. Pt agrees to re-start Praluent. We will submit PA for Praluent. Once approved, will re-check lipid panel in 2 months. Encouraged patient to continue eating lean proteins and vegetables in diet, and limiting amount of butter she is eating. Also suggested to get natural peanut butter without added sugar and oils. Pt plans to try indoor yoga to get more activity in.   Thank you,  Eliseo Gum, PharmD PGY1 Pharmacy Resident   09/22/2021  1:43 PM     Ramond Dial, Pharm.D, BCPS, CPP [facility]  [address], [place], [place] [address]  Phone: [phone]; Fax: [phone]

## 2021-09-21 NOTE — Telephone Encounter (Signed)
Pt contacted and advised order was placed via [facility] portal and we will follow up when order is completed and on it's way.

## 2021-09-22 ENCOUNTER — Encounter (HOSPITAL_COMMUNITY): Payer: Self-pay

## 2021-09-22 ENCOUNTER — Ambulatory Visit (HOSPITAL_COMMUNITY): Payer: Self-pay

## 2021-09-22 ENCOUNTER — Other Ambulatory Visit: Payer: Medicaid Other

## 2021-09-22 ENCOUNTER — Encounter: Payer: Self-pay | Admitting: Nurse Practitioner

## 2021-09-22 ENCOUNTER — Ambulatory Visit: Payer: Medicaid Other | Attending: Internal Medicine | Admitting: Pharmacist

## 2021-09-22 DIAGNOSIS — Z79899 Other long term (current) drug therapy: Secondary | ICD-10-CM | POA: Diagnosis not present

## 2021-09-23 ENCOUNTER — Telehealth: Payer: Self-pay | Admitting: Pharmacist

## 2021-09-23 ENCOUNTER — Other Ambulatory Visit: Payer: Self-pay

## 2021-09-23 DIAGNOSIS — I214 Non-ST elevation (NSTEMI) myocardial infarction: Secondary | ICD-10-CM

## 2021-09-23 DIAGNOSIS — E7800 Pure hypercholesterolemia, unspecified: Secondary | ICD-10-CM

## 2021-09-23 LAB — BASIC METABOLIC PANEL
BUN/Creatinine Ratio: 11 (ref 9–23)
BUN: 12 mg/dL (ref 6–24)
CO2: 22 mmol/L (ref 20–29)
Calcium: 10 mg/dL (ref 8.7–10.2)
Chloride: 102 mmol/L (ref 96–106)
Creatinine, Ser: 1.08 mg/dL — ABNORMAL HIGH (ref 0.57–1.00)
Glucose: 138 mg/dL — ABNORMAL HIGH (ref 70–99)
Potassium: 5.3 mmol/L — ABNORMAL HIGH (ref 3.5–5.2)
Sodium: 140 mmol/L (ref 134–144)
eGFR: 62 mL/min/{1.73_m2} (ref >59)

## 2021-09-23 MED ORDER — PRALUENT 150 MG/ML ~~LOC~~ SOAJ
1.0000 "pen " | SUBCUTANEOUS | 11 refills | Status: DC
  Filled 2021-09-23: qty 2, 28d supply, fill #0
  Filled 2021-11-08: qty 2, 28d supply, fill #1
  Filled 2021-12-19: qty 2, 28d supply, fill #2
  Filled 2022-01-23 – 2022-01-24 (×2): qty 2, 28d supply, fill #3
  Filled 2022-03-08: qty 2, 28d supply, fill #4
  Filled 2022-04-07: qty 2, 28d supply, fill #5
  Filled 2022-05-15: qty 2, 28d supply, fill #6
  Filled 2022-06-12: qty 2, 28d supply, fill #7
  Filled 2022-07-10: qty 2, 28d supply, fill #8
  Filled 2022-09-12: qty 2, 28d supply, fill #9

## 2021-09-23 NOTE — Telephone Encounter (Signed)
PA for Praluent submitted (Key: [id number])

## 2021-09-23 NOTE — Telephone Encounter (Signed)
PA approved through 09/23/22. Rx sent to pharmacy. Called pt. LVM to call back. Will need to schedule labs.

## 2021-09-27 ENCOUNTER — Other Ambulatory Visit: Payer: Self-pay

## 2021-09-27 NOTE — Telephone Encounter (Signed)
Left message that Praleunt was approved and Rx sent (per DPR). Requested pt call back to schedule labs

## 2021-09-28 ENCOUNTER — Ambulatory Visit: Payer: Medicaid Other | Attending: Nurse Practitioner | Admitting: Nurse Practitioner

## 2021-09-28 ENCOUNTER — Other Ambulatory Visit: Payer: Self-pay

## 2021-09-28 ENCOUNTER — Encounter: Payer: Self-pay | Admitting: Nurse Practitioner

## 2021-09-28 VITALS — BP 146/85 | HR 66 | Temp 98.1°F | Ht 65.0 in | Wt 148.4 lb

## 2021-09-28 DIAGNOSIS — E1059 Type 1 diabetes mellitus with other circulatory complications: Secondary | ICD-10-CM

## 2021-09-28 DIAGNOSIS — Z114 Encounter for screening for human immunodeficiency virus [HIV]: Secondary | ICD-10-CM

## 2021-09-28 DIAGNOSIS — Z1231 Encounter for screening mammogram for malignant neoplasm of breast: Secondary | ICD-10-CM

## 2021-09-28 DIAGNOSIS — F419 Anxiety disorder, unspecified: Secondary | ICD-10-CM

## 2021-09-28 DIAGNOSIS — F32A Depression, unspecified: Secondary | ICD-10-CM

## 2021-09-28 MED ORDER — HYDROXYZINE HCL 25 MG PO TABS
25.0000 mg | ORAL_TABLET | Freq: Three times a day (TID) | ORAL | 1 refills | Status: DC | PRN
  Filled 2021-09-28: qty 90, 30d supply, fill #0

## 2021-09-28 NOTE — Patient Instructions (Addendum)
If no improvement in myalgias despite taking hydroxyzine and changing sleeping quarters please send me a my chart message for additional testing.

## 2021-09-28 NOTE — Progress Notes (Signed)
Assessment & Plan:  Jennifer Hogan was seen today for generalized body aches.  Diagnoses and all orders for this visit:  Anxiety and depression -     hydrOXYzine (ATARAX) 25 MG tablet; Take 1 tablet (25 mg total) by mouth 3 (three) times daily as needed. -     Ambulatory referral to Psychiatry  Type 1 diabetes mellitus with other circulatory complication (HCC) -     Microalbumin / creatinine urine ratio  Encounter for screening for HIV -     HIV antibody (with reflex)  Breast cancer screening by mammogram -     MM 3D SCREEN BREAST BILATERAL; Future     Patient has been counseled on age-appropriate routine health concerns for screening and prevention. These are reviewed and up-to-date. Referrals have been placed accordingly. Immunizations are up-to-date or declined.    Subjective:   Chief Complaint  Patient presents with   Generalized Body Aches    Jennifer Hogan 51 y.o. female presents to office today with complaints of  body aches, stiffness and mood lability.   She sees endocrinology for type1 DM and hypothyroidism   She has past medical history of type 1 diabetes mellitis, anxiety and depression, GERD, ICM, chronic systolic heart failure with reduced ejection fraction, coronary artery disease sp angioplasty, NSTEMI, CABG x3, marijuana abuse, and hypothyroidism     She has been experiencing increased irritability and moodiness along with generalized body aches and stiffness. Her cat died the other week and that only worsened her mood. She does sleep on the couch most nights and confirms this may be contributing to her myalgias. She is searching for a therapist at this time for psychotherapy.      Review of Systems  Constitutional: Negative.  Negative for chills, fever, malaise/fatigue and weight loss.  HENT: Negative.  Negative for nosebleeds.   Eyes: Negative.  Negative for blurred vision, double vision and photophobia.  Respiratory: Negative.  Negative for cough,  shortness of breath and wheezing.   Cardiovascular: Negative.  Negative for chest pain, palpitations, orthopnea and leg swelling.  Gastrointestinal: Negative.  Negative for abdominal pain, heartburn, nausea and vomiting.  Genitourinary: Negative.  Negative for flank pain.  Musculoskeletal:  Positive for myalgias.  Skin: Negative.  Negative for rash.  Neurological: Negative.  Negative for dizziness, focal weakness, seizures and headaches.  Psychiatric/Behavioral:  Positive for depression. Negative for suicidal ideas. The patient is nervous/anxious.     Past Medical History:  Diagnosis Date   Allergy    Anemia    Anxiety    Asthma    Cataract    CHF (congestive heart failure) (HCC)    Coronary artery disease    a. s/p PTCA DES x3 in the LAD (ostial DES placed, mid DES placed, distal DES placed) and PTCA/DES x1 to intermediate branch 02/2017. b. ACS 02/08/18 with LCx stent placement.   Depression    Diabetes mellitus    type 1, patient is not using her insulin pump.   DKA (diabetic ketoacidoses) 12/31/2017   Dyspnea    with exertion and dust, no oxygen   Elevated platelet count    GERD (gastroesophageal reflux disease)    Heart murmur    High cholesterol    Hypothyroidism    Ischemic cardiomyopathy    a. EF low-normal with basal inferior akinesis, basal septal hypokinesis by echo 01/2018.   Myocardial infarction [facility]) 2019   Substance abuse [facility])     Past Surgical History:  Procedure Laterality Date  APPENDECTOMY     COLONOSCOPY     CORONARY ARTERY BYPASS GRAFT N/A 11/04/2018   Procedure: CORONARY ARTERY BYPASS GRAFTING (CABG), ON PUMP, TIMES THREE, USING LEFT AND RIGHT INTERNAL MAMMARY ARTERIES;  Surgeon: Wonda Olds, MD;  Location: [facility];  Service: Open Heart Surgery;  Laterality: N/A;   CORONARY STENT INTERVENTION N/A 03/20/2017   Procedure: DES x 3 LAD, DES OM; Surgeon: Burnell Blanks, MD;  Location: [place] CV LAB;  Service: Cardiovascular;  Laterality:  N/A;   CORONARY/GRAFT ACUTE MI REVASCULARIZATION N/A 02/08/2018   Procedure: Coronary/Graft Acute MI Revascularization;  Surgeon: Belva Crome, MD;  Location: [place] CV LAB;  Service: Cardiovascular;  Laterality: N/A;   GAS INSERTION Right 04/28/2021   Procedure: INSERTION OF GAS ( C3F8);  Surgeon: Jalene Mullet, MD;  Location: [facility];  Service: Ophthalmology;  Laterality: Right;  Right eye   GAS/FLUID EXCHANGE Right 04/28/2021   Procedure: GAS/FLUID EXCHANGE;  Surgeon: Jalene Mullet, MD;  Location: [place] [facility];  Service: Ophthalmology;  Laterality: Right;  Right eye   LEFT HEART CATH AND CORONARY ANGIOGRAPHY N/A 03/20/2017   Procedure: LEFT HEART CATH AND CORONARY ANGIOGRAPHY;  Surgeon: Burnell Blanks, MD;  Location: [place] [facility] LAB;  Service: Cardiovascular;  Laterality: N/A;   LEFT HEART CATH AND CORONARY ANGIOGRAPHY N/A 02/08/2018   Procedure: LEFT HEART CATH AND CORONARY ANGIOGRAPHY;  Surgeon: Belva Crome, MD;  Location: [place] CV LAB;  Service: Cardiovascular;  Laterality: N/A;   LEFT HEART CATH AND CORONARY ANGIOGRAPHY N/A 10/29/2018   Procedure: LEFT HEART CATH AND CORONARY ANGIOGRAPHY;  Surgeon: Burnell Blanks, MD;  Location: [facility] CV LAB;  Service: Cardiovascular;  Laterality: N/A;   LEFT HEART CATH AND CORS/GRAFTS ANGIOGRAPHY N/A 10/04/2020   Procedure: LEFT HEART CATH AND CORS/GRAFTS ANGIOGRAPHY;  Surgeon: Sherren Mocha, MD;  Location: [facility] [place] [facility];  Service: Cardiovascular;  Laterality: N/A;   LEFT HEART CATH AND CORS/GRAFTS ANGIOGRAPHY N/A 05/11/2021   Procedure: LEFT HEART CATH AND CORS/GRAFTS ANGIOGRAPHY;  Surgeon: Martinique, Peter M, MD;  Location: [facility] CV LAB;  Service: Cardiovascular;  Laterality: N/A;   PARS PLANA VITRECTOMY Right 04/28/2021   Procedure: PARS PLANA VITRECTOMY WITH 25 GAUGE REMOVAL OF TRATIONAL MEMBRANE RIGHT EYE; DRAINAGE OF SUBRETINAL FLUID RIGHT EYE;  Surgeon: Jalene Mullet, MD;  Location: [facility];  Service:  Ophthalmology;  Laterality: Right;  Right eye   PHOTOCOAGULATION WITH LASER Right 04/28/2021   Procedure: PHOTOCOAGULATION WITH LASER;  Surgeon: Jalene Mullet, MD;  Location: [facility];  Service: Ophthalmology;  Laterality: Right;  Right eye   TEE WITHOUT CARDIOVERSION N/A 11/04/2018   Procedure: TRANSESOPHAGEAL ECHOCARDIOGRAM (TEE);  Surgeon: Wonda Olds, MD;  Location: [facility];  Service: Open Heart Surgery;  Laterality: N/A;    Family History  Problem Relation Age of Onset   Cancer Mother        T cell lymphoma   Hypertension Mother    Hypercalcemia Mother    OCD Father    Anxiety disorder Father    Coronary artery disease Father    Congestive Heart Failure Father    Asthma Father    Hypercalcemia Father    Hypertension Father    Colon polyps Father    Depression Maternal Grandmother    Diabetes Paternal Grandmother    Stomach cancer Neg Hx    Esophageal cancer Neg Hx    Colon cancer Neg Hx    Rectal cancer Neg Hx     Social History  Reviewed with no changes to be made today.   Outpatient Medications Prior to Visit  Medication Sig Dispense Refill   acetaminophen (TYLENOL) 325 MG tablet Take 650 mg by mouth daily as needed for mild pain, fever or headache.     albuterol (VENTOLIN HFA) 108 (90 Base) MCG/ACT inhaler INHALE 2 PUFFS INTO THE LUNGS EVERY 6 (SIX) HOURS AS NEEDED FOR WHEEZING OR SHORTNESS OF BREATH. 8.5 g 2   Alirocumab (PRALUENT) 150 MG/ML SOAJ Inject 1 pen  into the skin every 14 (fourteen) days. 2 mL 11   aspirin 81 MG EC tablet Take 1 tablet (81 mg total) by mouth daily. 90 tablet 0   b complex vitamins tablet Take 1 tablet by mouth 3 (three) times a week.     budesonide (PULMICORT) 0.25 MG/2ML nebulizer solution Take 0.25 mg by nebulization as needed (shortness of breath/wheezing).     buPROPion (WELLBUTRIN XL) 150 MG 24 hr tablet Take 1 tablet (150 mg total) by mouth in the morning. 30 tablet 2   clopidogrel (PLAVIX) 75 MG tablet TAKE 1 TABLET BY MOUTH DAILY  WITH BREAKFAST. 90 tablet 3   diclofenac Sodium (VOLTAREN) 1 % GEL Apply 2-4 g topically as needed (knee pain).     diphenhydrAMINE (BENADRYL) 25 MG tablet Take 37.5 mg by mouth at bedtime.     fluticasone (FLONASE) 50 MCG/ACT nasal spray Place 1 spray into both nostrils at bedtime as needed for allergies.     furosemide (LASIX) 20 MG tablet Take 1 tablet (20 mg total) by mouth daily as needed (for weight gain). 15 tablet 3   Glucagon 3 MG/DOSE POWD Place 3 mg into the nose once as needed for up to 1 dose. 1 each 11   insulin lispro (HUMALOG) 100 UNIT/ML injection Inject 35 units into the skin daily via pump 10 mL 1   levothyroxine (SYNTHROID) 100 MCG tablet TAKE 1 TABLET (100 MCG TOTAL) BY MOUTH DAILY BEFORE BREAKFAST. 90 tablet 3   metoCLOPramide (REGLAN) 5 MG tablet Take 1 tablet (5 mg total) by mouth every 12 (twelve) hours as needed for nausea or vomiting. 60 tablet 3   metoprolol succinate (TOPROL-XL) 25 MG 24 hr tablet Take 0.5 tablets (12.5 mg total) by mouth daily. 15 tablet 6   montelukast (SINGULAIR) 10 MG tablet Take 1 tablet (10 mg total) by mouth at bedtime. 90 tablet 2   Multiple Vitamin (MULTIVITAMIN WITH MINERALS) TABS tablet Take 1 tablet by mouth daily.     nitroGLYCERIN (NITROSTAT) 0.4 MG SL tablet Place 1 tablet (0.4 mg total) under the tongue every 5 (five) minutes as needed for chest pain. 25 tablet 6   omeprazole (PRILOSEC OTC) 20 MG tablet Take 20 mg by mouth daily as needed (acid reflux).     OVER THE COUNTER MEDICATION Take 3 capsules by mouth daily before breakfast. [employer] Super Lysine Immune Support     spironolactone (ALDACTONE) 25 MG tablet Take 12.5 mg by mouth daily.     hydrOXYzine (ATARAX/VISTARIL) 25 MG tablet Take 1 tablet (25 mg total) by mouth 3 (three) times daily as needed. (Patient taking differently: Take 25 mg by mouth daily as needed for anxiety.) 60 tablet 1   No facility-administered medications prior to visit.    Allergies  Allergen  Reactions   Atorvastatin Other (See Comments)    Myalgias    Crestor [Rosuvastatin Calcium] Other (See Comments)    Severe myalgias and joint pain. Has tolerated atorvastatin though   Monosodium Glutamate  Nausea And Vomiting and Other (See Comments)    migraine   Zetia [Ezetimibe] Other (See Comments)    myalgias   Ciprofloxin Hcl [Ciprofloxacin] Nausea And Vomiting and Other (See Comments)    Severe migraine   Food Color Red [Red Dye] Diarrhea       Objective:    BP (!) 146/85   Pulse 66   Temp 98.1 F (36.7 C) (Oral)   Ht '5\' 5"'$  (1.651 m)   Wt 148 lb 6.4 oz (67.3 kg)   LMP 09/26/2021 (Exact Date)   SpO2 100%   BMI 24.70 kg/m  Wt Readings from Last 3 Encounters:  09/28/21 148 lb 6.4 oz (67.3 kg)  09/12/21 145 lb 6.4 oz (66 kg)  09/01/21 146 lb 6.4 oz (66.4 kg)    Physical Exam Vitals and nursing note reviewed. Exam conducted with a chaperone present.  Constitutional:      Appearance: She is well-developed.  HENT:     Head: Normocephalic and atraumatic.  Cardiovascular:     Rate and Rhythm: Normal rate and regular rhythm.     Heart sounds: Normal heart sounds. No murmur heard.    No friction rub. No gallop.  Pulmonary:     Effort: Pulmonary effort is normal. No tachypnea or respiratory distress.     Breath sounds: Normal breath sounds. No decreased breath sounds, wheezing, rhonchi or rales.  Chest:     Chest wall: No tenderness.  Abdominal:     General: Bowel sounds are normal.     Palpations: Abdomen is soft.     Hernia: There is no hernia in the left inguinal area.  Genitourinary:    Exam position: Lithotomy position.     Labia:        Right: No rash, tenderness, lesion or injury.        Left: No rash, tenderness, lesion or injury.      Vagina: Normal. No signs of injury and foreign body. No vaginal discharge, erythema, tenderness or bleeding.     Cervix: Normal.     Uterus: Not deviated and not enlarged.      Adnexa:        Right: No mass, tenderness or  fullness.         Left: No mass, tenderness or fullness.       Rectum: Normal. No external hemorrhoid.  Musculoskeletal:        General: Normal range of motion.     Cervical back: Normal range of motion.  Lymphadenopathy:     Lower Body: No right inguinal adenopathy. No left inguinal adenopathy.  Skin:    General: Skin is warm and dry.  Neurological:     Mental Status: She is alert and oriented to person, place, and time.     Coordination: Coordination normal.  Psychiatric:        Behavior: Behavior normal. Behavior is cooperative.        Thought Content: Thought content normal.        Judgment: Judgment normal.          Patient has been counseled extensively about nutrition and exercise as well as the importance of adherence with medications and regular follow-up. The patient was given clear instructions to go to ER or return to medical center if symptoms don't improve, worsen or new problems develop. The patient verbalized understanding.   Follow-up: Return in about 3 months (around 12/28/2021).   [name], FNP-BC [place] [facility] [place],  [place] [phone]   09/28/2021, 8:22 PM

## 2021-09-29 ENCOUNTER — Ambulatory Visit (HOSPITAL_COMMUNITY)
Admission: RE | Admit: 2021-09-29 | Discharge: 2021-09-29 | Disposition: A | Discharge status: Home / Self Care | Payer: Medicaid Other | Attending: Gastroenterology | Admitting: Gastroenterology

## 2021-09-29 ENCOUNTER — Other Ambulatory Visit: Payer: Self-pay

## 2021-09-29 ENCOUNTER — Encounter (HOSPITAL_COMMUNITY): Payer: Self-pay | Admitting: Gastroenterology

## 2021-09-29 ENCOUNTER — Encounter (HOSPITAL_COMMUNITY)
Admission: RE | Disposition: A | Discharge status: Home / Self Care | Payer: Self-pay | Source: Home / Self Care | Attending: Gastroenterology

## 2021-09-29 ENCOUNTER — Ambulatory Visit (HOSPITAL_BASED_OUTPATIENT_CLINIC_OR_DEPARTMENT_OTHER): Payer: Medicaid Other | Admitting: Certified Registered Nurse Anesthetist

## 2021-09-29 ENCOUNTER — Ambulatory Visit (HOSPITAL_COMMUNITY): Payer: Medicaid Other | Admitting: Certified Registered Nurse Anesthetist

## 2021-09-29 DIAGNOSIS — I252 Old myocardial infarction: Secondary | ICD-10-CM | POA: Diagnosis not present

## 2021-09-29 DIAGNOSIS — K219 Gastro-esophageal reflux disease without esophagitis: Secondary | ICD-10-CM | POA: Diagnosis not present

## 2021-09-29 DIAGNOSIS — Z1211 Encounter for screening for malignant neoplasm of colon: Secondary | ICD-10-CM

## 2021-09-29 DIAGNOSIS — I2510 Atherosclerotic heart disease of native coronary artery without angina pectoris: Secondary | ICD-10-CM | POA: Insufficient documentation

## 2021-09-29 DIAGNOSIS — Z5941 Food insecurity: Secondary | ICD-10-CM | POA: Diagnosis not present

## 2021-09-29 DIAGNOSIS — D759 Disease of blood and blood-forming organs, unspecified: Secondary | ICD-10-CM | POA: Insufficient documentation

## 2021-09-29 DIAGNOSIS — K31A0 Gastric intestinal metaplasia, unspecified: Secondary | ICD-10-CM | POA: Insufficient documentation

## 2021-09-29 DIAGNOSIS — E119 Type 2 diabetes mellitus without complications: Secondary | ICD-10-CM | POA: Diagnosis not present

## 2021-09-29 DIAGNOSIS — K317 Polyp of stomach and duodenum: Secondary | ICD-10-CM | POA: Diagnosis not present

## 2021-09-29 DIAGNOSIS — R6881 Early satiety: Secondary | ICD-10-CM | POA: Insufficient documentation

## 2021-09-29 DIAGNOSIS — E039 Hypothyroidism, unspecified: Secondary | ICD-10-CM | POA: Insufficient documentation

## 2021-09-29 DIAGNOSIS — K648 Other hemorrhoids: Secondary | ICD-10-CM | POA: Insufficient documentation

## 2021-09-29 DIAGNOSIS — K2950 Unspecified chronic gastritis without bleeding: Secondary | ICD-10-CM | POA: Insufficient documentation

## 2021-09-29 DIAGNOSIS — Z951 Presence of aortocoronary bypass graft: Secondary | ICD-10-CM | POA: Diagnosis not present

## 2021-09-29 DIAGNOSIS — J45909 Unspecified asthma, uncomplicated: Secondary | ICD-10-CM | POA: Diagnosis not present

## 2021-09-29 DIAGNOSIS — K5900 Constipation, unspecified: Secondary | ICD-10-CM | POA: Diagnosis not present

## 2021-09-29 DIAGNOSIS — K449 Diaphragmatic hernia without obstruction or gangrene: Secondary | ICD-10-CM | POA: Diagnosis not present

## 2021-09-29 DIAGNOSIS — I10 Essential (primary) hypertension: Secondary | ICD-10-CM | POA: Diagnosis not present

## 2021-09-29 DIAGNOSIS — E1059 Type 1 diabetes mellitus with other circulatory complications: Secondary | ICD-10-CM

## 2021-09-29 DIAGNOSIS — D124 Benign neoplasm of descending colon: Secondary | ICD-10-CM | POA: Insufficient documentation

## 2021-09-29 DIAGNOSIS — Z87891 Personal history of nicotine dependence: Secondary | ICD-10-CM | POA: Insufficient documentation

## 2021-09-29 DIAGNOSIS — K2289 Other specified disease of esophagus: Secondary | ICD-10-CM | POA: Diagnosis not present

## 2021-09-29 DIAGNOSIS — D638 Anemia in other chronic diseases classified elsewhere: Secondary | ICD-10-CM

## 2021-09-29 DIAGNOSIS — R1013 Epigastric pain: Secondary | ICD-10-CM | POA: Diagnosis present

## 2021-09-29 DIAGNOSIS — K635 Polyp of colon: Secondary | ICD-10-CM | POA: Diagnosis not present

## 2021-09-29 DIAGNOSIS — D649 Anemia, unspecified: Secondary | ICD-10-CM | POA: Insufficient documentation

## 2021-09-29 DIAGNOSIS — Z7902 Long term (current) use of antithrombotics/antiplatelets: Secondary | ICD-10-CM | POA: Diagnosis not present

## 2021-09-29 DIAGNOSIS — R112 Nausea with vomiting, unspecified: Secondary | ICD-10-CM | POA: Diagnosis not present

## 2021-09-29 DIAGNOSIS — D126 Benign neoplasm of colon, unspecified: Secondary | ICD-10-CM

## 2021-09-29 DIAGNOSIS — R194 Change in bowel habit: Secondary | ICD-10-CM

## 2021-09-29 HISTORY — PX: POLYPECTOMY: SHX5525

## 2021-09-29 HISTORY — PX: ESOPHAGOGASTRODUODENOSCOPY (EGD) WITH PROPOFOL: SHX5813

## 2021-09-29 HISTORY — PX: BIOPSY: SHX5522

## 2021-09-29 HISTORY — PX: COLONOSCOPY WITH PROPOFOL: SHX5780

## 2021-09-29 LAB — GLUCOSE, CAPILLARY
Glucose-Capillary: 208 mg/dL — ABNORMAL HIGH (ref 70–99)
Glucose-Capillary: 184 mg/dL — ABNORMAL HIGH (ref 70–99)

## 2021-09-29 SURGERY — ESOPHAGOGASTRODUODENOSCOPY (EGD) WITH PROPOFOL
Anesthesia: Monitor Anesthesia Care

## 2021-09-29 MED ORDER — PROPOFOL 500 MG/50ML IV EMUL
INTRAVENOUS | Status: DC | PRN
  Administered 2021-09-29: 125 ug/kg/min via INTRAVENOUS

## 2021-09-29 MED ORDER — PROPOFOL 1000 MG/100ML IV EMUL
INTRAVENOUS | Status: AC
  Filled 2021-09-29: qty 100

## 2021-09-29 MED ORDER — EPHEDRINE SULFATE-NACL 50-0.9 MG/10ML-% IV SOSY
PREFILLED_SYRINGE | INTRAVENOUS | Status: DC | PRN
  Administered 2021-09-29: 5 mg via INTRAVENOUS

## 2021-09-29 MED ORDER — LACTATED RINGERS IV SOLN: INTRAVENOUS | Status: DC

## 2021-09-29 MED ORDER — DEXMEDETOMIDINE (PRECEDEX) IN NS 20 MCG/5ML (4 MCG/ML) IV SYRINGE
PREFILLED_SYRINGE | INTRAVENOUS | Status: DC | PRN
  Administered 2021-09-29: 8 ug via INTRAVENOUS

## 2021-09-29 MED ORDER — LIDOCAINE 2% (20 MG/ML) 5 ML SYRINGE
INTRAMUSCULAR | Status: DC | PRN
  Administered 2021-09-29: 100 mg via INTRAVENOUS

## 2021-09-29 MED ORDER — PHENYLEPHRINE 80 MCG/ML (10ML) SYRINGE FOR IV PUSH (FOR BLOOD PRESSURE SUPPORT)
PREFILLED_SYRINGE | INTRAVENOUS | Status: DC | PRN
  Administered 2021-09-29: 80 ug via INTRAVENOUS

## 2021-09-29 MED ORDER — SODIUM CHLORIDE 0.9 % IV SOLN: INTRAVENOUS | Status: DC

## 2021-09-29 MED ORDER — PROPOFOL 10 MG/ML IV BOLUS
INTRAVENOUS | Status: DC | PRN
  Administered 2021-09-29: 20 mg via INTRAVENOUS

## 2021-09-29 SURGICAL SUPPLY — 25 items

## 2021-09-29 NOTE — Op Note (Signed)
[facility] Patient Name: Jennifer Hogan Procedure Date: 09/29/2021 MRN: [id number] Attending MD: Carlota Raspberry. Havery Moros , MD Date of Birth: 01/31/1970 CSN: [id number] Age: 51 Admit Type: Outpatient Procedure:                Upper GI endoscopy Indications:              Epigastric abdominal pain, Nausea with vomiting,                            Early satiety - recurrent episodes that have led to                            hospitalization in the past, no prior EGD. CT                            imaging negative Providers:                Remo Lipps P. Havery Moros, MD, Benay Pillow, RN, [facility] Technician, Technician Referring MD:              Medicines:                Monitored Anesthesia Care Complications:            No immediate complications. Estimated blood loss:                            Minimal. Estimated Blood Loss:     Estimated blood loss was minimal. Procedure:                Pre-Anesthesia Assessment:                           - Prior to the procedure, a History and Physical                            was performed, and patient medications and                            allergies were reviewed. The patient's tolerance of                            previous anesthesia was also reviewed. The risks                            and benefits of the procedure and the sedation                            options and risks were discussed with the patient.                            All questions were answered, and informed consent  was obtained. Prior Anticoagulants: The patient has                            taken Plavix (clopidogrel), last dose was 5 days                            prior to procedure. ASA Grade Assessment: III - A                            patient with severe systemic disease. After                            reviewing the risks and benefits, the patient was                            deemed in  satisfactory condition to undergo the                            procedure.                           After obtaining informed consent, the endoscope was                            passed under direct vision. Throughout the                            procedure, the patient's blood pressure, pulse, and                            oxygen saturations were monitored continuously. The                            [id number]-H190 ([id number]) Olympus endoscope was introduced                            through the mouth, and advanced to the second part                            of duodenum. The upper GI endoscopy was                            accomplished without difficulty. The patient                            tolerated the procedure well. Scope In: Scope Out: Findings:      Esophagogastric landmarks were identified: the Z-line was found at 41       cm, the gastroesophageal junction was found at 41 cm and the upper       extent of the gastric folds was found at 42 cm from the incisors.      A 1 cm hiatal hernia was present.      The Z-line was irregular but did not meet criteria for Barrett's       biopsies.  The exam of the esophagus was otherwise normal. Of note, adherent mucous       made photos of the esophagus less clear than usual.      A few small sessile polyps were found in the gastric body. One       representative polyp was removed with a cold biopsy forceps, rule out       adenoma. Resection and retrieval were complete.      The exam of the stomach was otherwise normal.      Biopsies were taken with a cold forceps in the gastric body, at the       incisura and in the gastric antrum for Helicobacter pylori testing.      The examined duodenum was normal. Biopsies for histology were taken with       a cold forceps for evaluation of celiac disease. Impression:               - Esophagogastric landmarks identified.                           - 1 cm hiatal hernia.                           -  Z-line irregular but did not meet criteria for                            Barrett's.                           - A few gastric polyps. Representative sample                            resected and retrieved.                           - Normal stomach otherwise - no outlet obstruction                           - Normal examined duodenum. Biopsied.                           Will await biopsy results but suspect patient                            likely has underlying gastroparesis. Will discuss                            options with her following pathology results. Moderate Sedation:      No moderate sedation, case performed with MAC Recommendation:           - Patient has a contact number available for                            emergencies. The signs and symptoms of potential                            delayed complications were discussed with the  patient. Return to normal activities tomorrow.                            Written discharge instructions were provided to the                            patient.                           - Resume previous diet.                           - Continue present medications.                           - Resume Plavix tomorrow                           - Await pathology results. Procedure Code(s):        --- Professional ---                           [phone], Esophagogastroduodenoscopy, flexible,                            transoral; with biopsy, single or multiple Diagnosis Code(s):        --- Professional ---                           K44.9, Diaphragmatic hernia without obstruction or                            gangrene                           K22.8, Other specified diseases of esophagus                           K31.7, Polyp of stomach and duodenum                           R10.13, Epigastric pain                           R11.2, Nausea with vomiting, unspecified CPT copyright 2019 [employer]. All  rights reserved. The codes documented in this report are preliminary and upon coder review may  be revised to meet current compliance requirements. Remo Lipps P. Ruhama Lehew, MD 09/29/2021 1:51:52 PM This report has been signed electronically. Number of Addenda: 0

## 2021-09-29 NOTE — Op Note (Addendum)
[facility] Patient Name: Jennifer Hogan Procedure Date: 09/29/2021 MRN: [id number] Attending MD: Carlota Raspberry. Havery Moros , MD Date of Birth: [birth year] CSN: [id number] Age: 51 Admit Type: Outpatient Procedure:                Colonoscopy Indications:              Altered bowel habits - occasional constipation,                            occasional diarrhea associated with episodes of                            nausea / vomiting Providers:                Carlota Raspberry. Havery Moros, MD, Benay Pillow, RN, [facility] Technician, Technician Referring MD:              Medicines:                Monitored Anesthesia Care Complications:            No immediate complications. Estimated blood loss:                            Minimal. Estimated Blood Loss:     Estimated blood loss was minimal. Procedure:                Pre-Anesthesia Assessment:                           - Prior to the procedure, a History and Physical                            was performed, and patient medications and                            allergies were reviewed. The patient's tolerance of                            previous anesthesia was also reviewed. The risks                            and benefits of the procedure and the sedation                            options and risks were discussed with the patient.                            All questions were answered, and informed consent                            was obtained. Prior Anticoagulants: The patient has                            taken Plavix (clopidogrel), last  dose was 5 days                            prior to procedure. ASA Grade Assessment: III - A                            patient with severe systemic disease. After                            reviewing the risks and benefits, the patient was                            deemed in satisfactory condition to undergo the                            procedure.                            After obtaining informed consent, the colonoscope                            was passed under direct vision. Throughout the                            procedure, the patient's blood pressure, pulse, and                            oxygen saturations were monitored continuously. The                            [id number]-HQ190L ([id number]) Olympus colonoscope was                            introduced through the anus and advanced to the the                            terminal ileum, with identification of the                            appendiceal orifice and IC valve. The colonoscopy                            was performed without difficulty. The patient                            tolerated the procedure well. The quality of the                            bowel preparation was adequate. The terminal ileum,                            ileocecal valve, appendiceal orifice, and rectum  were photographed. Scope In: 1:22:57 PM Scope Out: 1:38:23 PM Scope Withdrawal Time: 0 hours 11 minutes 22 seconds  Total Procedure Duration: 0 hours 15 minutes 26 seconds  Findings:      The perianal and digital rectal examinations were normal.      The terminal ileum appeared normal.      A 5 mm polyp was found in the descending colon. The polyp was sessile.       The polyp was removed with a cold snare. Resection and retrieval were       complete.      Internal hemorrhoids were found during retroflexion. The hemorrhoids       were small.      The exam was otherwise without abnormality.      Biopsies for histology were taken with a cold forceps from the right       colon, left colon and transverse colon for evaluation of microscopic       colitis. Impression:               - The examined portion of the ileum was normal.                           - One 5 mm polyp in the descending colon, removed                            with a cold snare. Resected and retrieved.                            - Internal hemorrhoids.                           - The examination was otherwise normal.                           - Biopsies were taken with a cold forceps from the                            right colon, left colon and transverse colon for                            evaluation of microscopic colitis.                           Will await pathology results. Quite possible the                            patient has an underlying motility disorder in the                            setting of diabetes. Moderate Sedation:      No moderate sedation, case performed with MAC Recommendation:           - Patient has a contact number available for                            emergencies. The signs and symptoms of potential  delayed complications were discussed with the                            patient. Return to normal activities tomorrow.                            Written discharge instructions were provided to the                            patient.                           - Resume previous diet.                           - Continue present medications.                           - Resume Plavix tomorrow                           - Await pathology results with further                            recommendations.                           - Trial of daily fiber supplement such as Citrucel                            if not yet tried Procedure Code(s):        --- Professional ---                           [phone], Colonoscopy, flexible; with removal of                            tumor(s), polyp(s), or other lesion(s) by snare                            technique                           [id number], 59, Colonoscopy, flexible; with biopsy,                            single or multiple Diagnosis Code(s):        --- Professional ---                           K64.8, Other hemorrhoids                           K63.5, Polyp of colon                           R19.4,  Change in bowel habit CPT copyright 2019 American Medical Association. All rights reserved. The codes documented  in this report are preliminary and upon coder review may  be revised to meet current compliance requirements. Remo Lipps P. Nilaya Bouie, MD 09/29/2021 1:44:00 PM This report has been signed electronically. Number of Addenda: 0

## 2021-09-29 NOTE — Transfer of Care (Signed)
Immediate Anesthesia Transfer of Care Note  Patient: [name]  Procedure(s) Performed: ESOPHAGOGASTRODUODENOSCOPY (EGD) WITH PROPOFOL COLONOSCOPY WITH PROPOFOL POLYPECTOMY BIOPSY  Patient Location: PACU and Endoscopy Unit  Anesthesia Type:MAC  Level of Consciousness: awake, alert  and patient cooperative  Airway & Oxygen Therapy: Patient Spontanous Breathing  Post-op Assessment: Report given to RN and Post -op Vital signs reviewed and stable  Post vital signs: Reviewed and stable  Last Vitals:  Vitals Value Taken Time  BP 114/93 09/29/21 1346  Temp    Pulse 88 09/29/21 1347  Resp 19 09/29/21 1347  SpO2 98 % 09/29/21 1347  Vitals shown include unvalidated device data.  Last Pain:  Vitals:   09/29/21 1148  TempSrc: Temporal  PainSc: 0-No pain         Complications: No notable events documented.

## 2021-09-29 NOTE — Anesthesia Preprocedure Evaluation (Addendum)
Anesthesia Evaluation  Patient identified by MRN, date of birth, ID band Patient awake    Reviewed: Allergy & Precautions, NPO status , Patient's Chart, lab work & pertinent test results  Airway Mallampati: II  TM Distance: >3 FB     Dental   Pulmonary shortness of breath, asthma , former smoker,    breath sounds clear to auscultation       Cardiovascular hypertension, Pt. on home beta blockers and Pt. on medications + CAD, + Past MI and + CABG   Rhythm:Regular Rate:Normal  ECHO 4/23 1. Diffuse hypokinesis worse in septum and apex . Left ventricular  ejection fraction, by estimation, is 30 to 35%. The left ventricle has  moderately decreased function. The left ventricle demonstrates global  hypokinesis. The left ventricular internal  cavity size was severely dilated. Left ventricular diastolic parameters  are indeterminate.  2. Right ventricular systolic function is normal. The right ventricular  size is normal. There is normal pulmonary artery systolic pressure.  3. The mitral valve is abnormal. Mild mitral valve regurgitation. No  evidence of mitral stenosis.  4. The aortic valve is tricuspid. Aortic valve regurgitation is not  visualized. No aortic stenosis is present.  5. The inferior vena cava is normal in size with greater than 50%  respiratory variability, suggesting right atrial pressure of 3 mmHg.    Neuro/Psych PSYCHIATRIC DISORDERS    GI/Hepatic Neg liver ROS, GERD  Medicated and Controlled,  Endo/Other  diabetesHypothyroidism   Renal/GU Renal disease     Musculoskeletal   Abdominal   Peds  Hematology  (+) Blood dyscrasia, anemia ,   Anesthesia Other Findings   Reproductive/Obstetrics                             Anesthesia Physical  Anesthesia Plan  ASA: 3  Anesthesia Plan: MAC   Post-op Pain Management:    Induction: Intravenous  PONV Risk Score and Plan: 3 and  Ondansetron  Airway Management Planned: Simple Face Mask and Natural Airway  Additional Equipment: None  Intra-op Plan:   Post-operative Plan:   Informed Consent: I have reviewed the patients History and Physical, chart, labs and discussed the procedure including the risks, benefits and alternatives for the proposed anesthesia with the patient or authorized representative who has indicated his/her understanding and acceptance.     Dental advisory given  Plan Discussed with: CRNA and Anesthesiologist  Anesthesia Plan Comments: (PAT note written 04/27/2021 by Myra Gianotti, PA-C. DISCUSSION: Patient is a 51 year old female scheduled for the above procedure.  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  Last visit with cardiologist Dr. Radford Pax was on 03/01/2021.  She had not had any anginal symptoms.  She was continued on aspirin, Plavix, Toprol, atorvastatin.  She was off spironolactone due to hyperkalemia and was not on ARB therapy due to hyperkalemia.  She did not appear volume overloaded on exam.  30-monthfollow-up planned.)       Anesthesia Quick Evaluation

## 2021-09-29 NOTE — Anesthesia Postprocedure Evaluation (Signed)
Anesthesia Post Note  Patient: Jennifer Hogan  Procedure(s) Performed: ESOPHAGOGASTRODUODENOSCOPY (EGD) WITH PROPOFOL COLONOSCOPY WITH PROPOFOL POLYPECTOMY BIOPSY     Patient location during evaluation: PACU Anesthesia Type: MAC Level of consciousness: awake and alert Pain management: pain level controlled Vital Signs Assessment: post-procedure vital signs reviewed and stable Respiratory status: spontaneous breathing, nonlabored ventilation, respiratory function stable and patient connected to nasal cannula oxygen Cardiovascular status: stable and blood pressure returned to baseline Postop Assessment: no apparent nausea or vomiting Anesthetic complications: no   No notable events documented.  Last Vitals:  Vitals:   09/29/21 1355 09/29/21 1410  BP: (!) 92/54 99/65  Pulse: 98 82  Resp: 20 (!) 21  Temp:    SpO2: 100% 100%    Last Pain:  Vitals:   09/29/21 1410  TempSrc:   PainSc: 0-No pain                 Dareld Mcauliffe

## 2021-09-29 NOTE — H&P (Signed)
[facility] History and Physical   Primary Care Physician:  [name], NP   Reason for Procedure:   Recurrent nausea / vomiting, epigastric pain leading to admissions in the past - suspected gastroparesis no prior EGD, altered bowel habits  Plan:    EGD and colonoscopy     HPI: Jennifer Hogan is a 51 y.o. female  here for EGD and colonoscopy to evaluate issues as above. She has had multiple hospitalizations in the past for nausea / vomiting, epigastric pain, early satiety. Suspected gastroparesis but no prior EGD. Also with intermittent constipation and then diarrhea which she thinks are related to her vomiting episodes. Colonoscopy she thinks in 2017 but no reports available. She wishes to proceed with EGD and colonoscopy to further evaluate. Off Plavix for 5 days - cardiac history as outlined below. Case done at the hospital for anesthesia support. She otherwise feels well today without complaints - denies any cardiopulmonary symptoms that bother her.   I have discussed risks / benefits of anesthesia and endoscopic procedure with Jennifer Hogan and they wish to proceed with the exams as outlined today.    Past Medical History:  Diagnosis Date   Allergy    Anemia    Anxiety    Asthma    Cataract    CHF (congestive heart failure) (HCC)    Coronary artery disease    a. s/p PTCA DES x3 in the LAD (ostial DES placed, mid DES placed, distal DES placed) and PTCA/DES x1 to intermediate branch 02/2017. b. ACS 02/08/18 with LCx stent placement.   Depression    Diabetes mellitus    type 1, patient is not using her insulin pump.   DKA (diabetic ketoacidoses) 12/31/2017   Dyspnea    with exertion and dust, no oxygen   Elevated platelet count    GERD (gastroesophageal reflux disease)    Heart murmur    High cholesterol    Hypothyroidism    Ischemic cardiomyopathy    a. EF low-normal with basal inferior akinesis, basal septal hypokinesis by echo 01/2018.    Myocardial infarction ([facility]) 2019   Substance abuse ([place])     Past Surgical History:  Procedure Laterality Date   APPENDECTOMY     COLONOSCOPY     CORONARY ARTERY BYPASS GRAFT N/A 11/04/2018   Procedure: CORONARY ARTERY BYPASS GRAFTING (CABG), ON PUMP, TIMES THREE, USING LEFT AND RIGHT INTERNAL MAMMARY ARTERIES;  Surgeon: Wonda Olds, MD;  Location: [facility];  Service: Open Heart Surgery;  Laterality: N/A;   CORONARY STENT INTERVENTION N/A 03/20/2017   Procedure: DES x 3 LAD, DES OM; Surgeon: Burnell Blanks, MD;  Location: [facility];  Service: Cardiovascular;  Laterality: N/A;   CORONARY/GRAFT ACUTE MI REVASCULARIZATION N/A 02/08/2018   Procedure: Coronary/Graft Acute MI Revascularization;  Surgeon: Belva Crome, MD;  Location: [facility];  Service: Cardiovascular;  Laterality: N/A;   GAS INSERTION Right 04/28/2021   Procedure: INSERTION OF GAS ( C3F8);  Surgeon: Jalene Mullet, MD;  Location: [facility];  Service: Ophthalmology;  Laterality: Right;  Right eye   GAS/FLUID EXCHANGE Right 04/28/2021   Procedure: GAS/FLUID EXCHANGE;  Surgeon: Jalene Mullet, MD;  Location: [facility];  Service: Ophthalmology;  Laterality: Right;  Right eye   LEFT HEART CATH AND CORONARY ANGIOGRAPHY N/A 03/20/2017   Procedure: LEFT HEART CATH AND CORONARY ANGIOGRAPHY;  Surgeon: Burnell Blanks, MD;  Location: [facility];  Service: Cardiovascular;  Laterality: N/A;  LEFT HEART CATH AND CORONARY ANGIOGRAPHY N/A 02/08/2018   Procedure: LEFT HEART CATH AND CORONARY ANGIOGRAPHY;  Surgeon: Belva Crome, MD;  Location: [facility];  Service: Cardiovascular;  Laterality: N/A;   LEFT HEART CATH AND CORONARY ANGIOGRAPHY N/A 10/29/2018   Procedure: LEFT HEART CATH AND CORONARY ANGIOGRAPHY;  Surgeon: Burnell Blanks, MD;  Location: [facility];  Service: Cardiovascular;  Laterality: N/A;   LEFT HEART CATH AND CORS/GRAFTS ANGIOGRAPHY N/A 10/04/2020   Procedure:  LEFT HEART CATH AND CORS/GRAFTS ANGIOGRAPHY;  Surgeon: Sherren Mocha, MD;  Location: [facility];  Service: Cardiovascular;  Laterality: N/A;   LEFT HEART CATH AND CORS/GRAFTS ANGIOGRAPHY N/A 05/11/2021   Procedure: LEFT HEART CATH AND CORS/GRAFTS ANGIOGRAPHY;  Surgeon: Martinique, Peter M, MD;  Location: [facility];  Service: Cardiovascular;  Laterality: N/A;   PARS PLANA VITRECTOMY Right 04/28/2021   Procedure: PARS PLANA VITRECTOMY WITH 25 GAUGE REMOVAL OF TRATIONAL MEMBRANE RIGHT EYE; DRAINAGE OF SUBRETINAL FLUID RIGHT EYE;  Surgeon: Jalene Mullet, MD;  Location: [facility];  Service: Ophthalmology;  Laterality: Right;  Right eye   PHOTOCOAGULATION WITH LASER Right 04/28/2021   Procedure: PHOTOCOAGULATION WITH LASER;  Surgeon: Jalene Mullet, MD;  Location: [facility];  Service: Ophthalmology;  Laterality: Right;  Right eye   TEE WITHOUT CARDIOVERSION N/A 11/04/2018   Procedure: TRANSESOPHAGEAL ECHOCARDIOGRAM (TEE);  Surgeon: Wonda Olds, MD;  Location: [facility];  Service: Open Heart Surgery;  Laterality: N/A;    Prior to Admission medications   Medication Sig Start Date End Date Taking? Authorizing Provider  acetaminophen (TYLENOL) 325 MG tablet Take 650 mg by mouth daily as needed for mild pain, fever or headache.   Yes [provider]  albuterol (VENTOLIN HFA) 108 (90 Base) MCG/ACT inhaler INHALE 2 PUFFS INTO THE LUNGS EVERY 6 (SIX) HOURS AS NEEDED FOR WHEEZING OR SHORTNESS OF BREATH. 12/01/20 12/01/21 Yes Charlott Rakes, MD  aspirin 81 MG EC tablet Take 1 tablet (81 mg total) by mouth daily. 12/07/17  Yes Amin, Jeanella Flattery, MD  b complex vitamins tablet Take 1 tablet by mouth 3 (three) times a week.   Yes [provider]  budesonide (PULMICORT) 0.25 MG/2ML nebulizer solution Take 0.25 mg by nebulization as needed (shortness of breath/wheezing).   Yes [provider]  buPROPion (WELLBUTRIN XL) 150 MG 24 hr tablet Take 1 tablet (150 mg total) by mouth in  the morning. 09/21/21 12/20/21 Yes Bahraini, Sarah A  clopidogrel (PLAVIX) 75 MG tablet TAKE 1 TABLET BY MOUTH DAILY WITH BREAKFAST. 11/01/20  Yes Turner, Eber Hong, MD  diclofenac Sodium (VOLTAREN) 1 % GEL Apply 2-4 g topically as needed (knee pain).   Yes [provider]  diphenhydrAMINE (BENADRYL) 25 MG tablet Take 37.5 mg by mouth at bedtime.   Yes [provider]  fluticasone (FLONASE) 50 MCG/ACT nasal spray Place 1 spray into both nostrils at bedtime as needed for allergies.   Yes [provider]  hydrOXYzine (ATARAX) 25 MG tablet Take 1 tablet (25 mg total) by mouth 3 (three) times daily as needed. 09/28/21  Yes [name], NP  insulin lispro (HUMALOG) 100 UNIT/ML injection Inject 35 units into the skin daily via pump 04/30/21 04/30/22 Yes Renato Shin, MD  levothyroxine (SYNTHROID) 100 MCG tablet TAKE 1 TABLET (100 MCG TOTAL) BY MOUTH DAILY BEFORE BREAKFAST. 12/01/20 12/01/21 Yes Renato Shin, MD  metoCLOPramide (REGLAN) 5 MG tablet Take 1 tablet (5 mg total) by mouth every 12 (twelve) hours  as needed for nausea or vomiting. 06/27/21 06/27/22 Yes [name], NP  metoprolol succinate (TOPROL-XL) 25 MG 24 hr tablet Take 0.5 tablets (12.5 mg total) by mouth daily. 03/07/21  Yes Bensimhon, Shaune Pascal, MD  montelukast (SINGULAIR) 10 MG tablet Take 1 tablet (10 mg total) by mouth at bedtime. 08/11/21  Yes [name], NP  Multiple Vitamin (MULTIVITAMIN WITH MINERALS) TABS tablet Take 1 tablet by mouth daily.   Yes [provider]  nitroGLYCERIN (NITROSTAT) 0.4 MG SL tablet Place 1 tablet (0.4 mg total) under the tongue every 5 (five) minutes as needed for chest pain. 10/14/20  Yes Clegg, Amy D, NP  OVER THE COUNTER MEDICATION Take 3 capsules by mouth daily before breakfast. [employer] Super Lysine Immune Support   Yes [provider]  spironolactone (ALDACTONE) 25 MG tablet Take 12.5 mg by mouth daily.   Yes [provider]  Alirocumab  (PRALUENT) 150 MG/ML SOAJ Inject 1 pen  into the skin every 14 (fourteen) days. 09/23/21   [name], MD  furosemide (LASIX) 20 MG tablet Take 1 tablet (20 mg total) by mouth daily as needed (for weight gain). 10/14/20   Clegg, Amy D, NP  Glucagon 3 MG/DOSE POWD Place 3 mg into the nose once as needed for up to 1 dose. 07/01/21   Philemon Kingdom, MD  omeprazole (PRILOSEC OTC) 20 MG tablet Take 20 mg by mouth daily as needed (acid reflux).    [provider]    Current Facility-Administered Medications  Medication Dose Route Frequency Provider Last Rate Last Admin   0.9 %  sodium chloride infusion   Intravenous Continuous Amaan Meyer, Carlota Raspberry, MD       lactated ringers infusion   Intravenous Continuous Fidel Caggiano, Carlota Raspberry, MD        Allergies as of 08/08/2021 - Review Complete 07/06/2021  Allergen Reaction Noted   Atorvastatin Other (See Comments) 02/19/2019   Crestor [rosuvastatin calcium] Other (See Comments) 10/31/2018   Monosodium glutamate Nausea And Vomiting and Other (See Comments) 06/06/2019   Zetia [ezetimibe] Other (See Comments) 02/19/2019   Ciprofloxin hcl [ciprofloxacin] Nausea And Vomiting and Other (See Comments) 07/31/2012   Food color red [red dye] Diarrhea 07/31/2012    Family History  Problem Relation Age of Onset   Cancer Mother        T cell lymphoma   Hypertension Mother    Hypercalcemia Mother    OCD Father    Anxiety disorder Father    Coronary artery disease Father    Congestive Heart Failure Father    Asthma Father    Hypercalcemia Father    Hypertension Father    Colon polyps Father    Depression Maternal Grandmother    Diabetes Paternal Grandmother    Stomach cancer Neg Hx    Esophageal cancer Neg Hx    Colon cancer Neg Hx    Rectal cancer Neg Hx     Social History   Socioeconomic History   Marital status: Divorced    Spouse name: Not on file   Number of children: 0   Years of education: Not on file   Highest education level:  Professional school degree (e.g., MD, DDS, DVM, JD)  Occupational History   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"   Occupation: adjunct facilty at [employer]    Comment: Chemistry professor  Tobacco Use   Smoking status: Former    Types: Cigarettes   Smokeless tobacco: Never   Tobacco  comments:    Smoked cigarettes in her 20's for 1 year  Vaping Use   Vaping Use: Never used  Substance and Sexual Activity   Alcohol use: Yes    Comment: Socially   Drug use: Yes    Types: Marijuana    Comment: 09/16/21   Sexual activity: Not Currently    Birth control/protection: None  Other Topics Concern   Not on file  Social History Narrative   Not on file   Social Determinants of Health   Financial Resource Strain: High Risk (10/05/2020)   Overall Financial Resource Strain (CARDIA)    Difficulty of Paying Living Expenses: Very hard  Food Insecurity: Food Insecurity Present (10/05/2020)   Hunger Vital Sign    Worried About Running Out of Food in the Last Year: Sometimes true    Ran Out of Food in the Last Year: Sometimes true  Transportation Needs: No Transportation Needs (10/05/2020)   PRAPARE - Hydrologist (Medical): No    Lack of Transportation (Non-Medical): No  Physical Activity: Not on file  Stress: Not on file  Social Connections: Unknown (12/07/2017)   Social Connection and Isolation Panel [NHANES]    Frequency of Communication with Friends and Family: Patient refused    Frequency of Social Gatherings with Friends and Family: Patient refused    Attends Religious Services: Patient refused    Active Member of Clubs or Organizations: Patient refused    Attends Archivist Meetings: Patient refused    Marital Status: Patient refused  Intimate Partner Violence: Not At Risk (12/07/2017)   Humiliation, Afraid, Rape, and Kick questionnaire    Fear of Current or Ex-Partner: No    Emotionally Abused: No    Physically Abused: No    Sexually Abused: No     Review of Systems: All other review of systems negative except as mentioned in the HPI.  Physical Exam: Vital signs BP 124/73   Pulse 74   Temp 98.1 F (36.7 C) (Temporal)   Resp 17   Ht '5\' 5"'$  (1.651 m)   Wt 67.1 kg   SpO2 98%   BMI 24.63 kg/m   General:   Alert,  Well-developed, pleasant and cooperative in NAD Lungs:  Clear throughout to auscultation.   Heart:  Regular rate and rhythm Abdomen:  Soft, nontender and nondistended.   Neuro/Psych:  Alert and cooperative. Normal mood and affect. A and O x 3  Jolly Mango, MD [facility]

## 2021-09-29 NOTE — Discharge Instructions (Signed)
YOU HAD AN ENDOSCOPIC PROCEDURE TODAY: Refer to the procedure report and other information in the discharge instructions given to you for any specific questions about what was found during the examination. If this information does not answer your questions, please call [facility] office at [phone] to clarify.  ° °YOU SHOULD EXPECT: Some feelings of bloating in the abdomen. Passage of more gas than usual. Walking can help get rid of the air that was put into your GI tract during the procedure and reduce the bloating. If you had a lower endoscopy (such as a colonoscopy or flexible sigmoidoscopy) you may notice spotting of blood in your stool or on the toilet paper. Some abdominal soreness may be present for a day or two, also. ° °DIET: Your first meal following the procedure should be a light meal and then it is ok to progress to your normal diet. A half-sandwich or bowl of soup is an example of a good first meal. Heavy or fried foods are harder to digest and may make you feel nauseous or bloated. Drink plenty of fluids but you should avoid alcoholic beverages for 24 hours. If you had a esophageal dilation, please see attached instructions for diet.   ° °ACTIVITY: Your care partner should take you home directly after the procedure. You should plan to take it easy, moving slowly for the rest of the day. You can resume normal activity the day after the procedure however YOU SHOULD NOT DRIVE, use power tools, machinery or perform tasks that involve climbing or major physical exertion for 24 hours (because of the sedation medicines used during the test).  ° °SYMPTOMS TO REPORT IMMEDIATELY: °A gastroenterologist can be reached at any hour. Please call [phone]  for any of the following symptoms:  °Following lower endoscopy (colonoscopy, flexible sigmoidoscopy) °Excessive amounts of blood in the stool  °Significant tenderness, worsening of abdominal pains  °Swelling of the abdomen that is new, acute  °Fever of 100° or  higher  °Following upper endoscopy (EGD, EUS, ERCP, esophageal dilation) °Vomiting of blood or coffee ground material  °New, significant abdominal pain  °New, significant chest pain or pain under the shoulder blades  °Painful or persistently difficult swallowing  °New shortness of breath  °Black, tarry-looking or red, bloody stools ° °FOLLOW UP:  °If any biopsies were taken you will be contacted by phone or by letter within the next 1-3 weeks. Call [phone]  if you have not heard about the biopsies in 3 weeks.  °Please also call with any specific questions about appointments or follow up tests. ° °

## 2021-09-30 ENCOUNTER — Encounter (HOSPITAL_COMMUNITY): Payer: Self-pay | Admitting: Gastroenterology

## 2021-09-30 LAB — MICROALBUMIN / CREATININE URINE RATIO
Creatinine, Urine: 21.3 mg/dL
Microalb/Creat Ratio: <14 mg/g creat (ref 0–29)
Microalbumin, Urine: <3 ug/mL

## 2021-09-30 LAB — HIV ANTIBODY (ROUTINE TESTING W REFLEX): HIV Screen 4th Generation wRfx: NONREACTIVE

## 2021-10-03 LAB — SURGICAL PATHOLOGY

## 2021-10-10 ENCOUNTER — Ambulatory Visit: Payer: Medicaid Other

## 2021-10-10 ENCOUNTER — Other Ambulatory Visit: Payer: Self-pay

## 2021-10-10 ENCOUNTER — Encounter: Payer: Medicaid Other | Admitting: Medical

## 2021-10-11 ENCOUNTER — Telehealth: Payer: Self-pay | Admitting: Nurse Practitioner

## 2021-10-11 ENCOUNTER — Other Ambulatory Visit: Payer: Self-pay

## 2021-10-11 DIAGNOSIS — R112 Nausea with vomiting, unspecified: Secondary | ICD-10-CM

## 2021-10-11 DIAGNOSIS — R1013 Epigastric pain: Secondary | ICD-10-CM

## 2021-10-11 NOTE — Telephone Encounter (Signed)
Lm on vm for patient to return call 

## 2021-10-11 NOTE — Telephone Encounter (Signed)
PT is calling in to discuss results. Please advise

## 2021-10-12 NOTE — Telephone Encounter (Signed)
This has been addressed. See 09/29/21 pathology result notes.

## 2021-10-14 ENCOUNTER — Encounter (HOSPITAL_COMMUNITY): Payer: Self-pay | Admitting: Psychiatry

## 2021-10-14 ENCOUNTER — Other Ambulatory Visit: Payer: Self-pay

## 2021-10-14 ENCOUNTER — Telehealth (INDEPENDENT_AMBULATORY_CARE_PROVIDER_SITE_OTHER): Payer: Medicaid Other | Admitting: Psychiatry

## 2021-10-14 ENCOUNTER — Ambulatory Visit: Payer: Medicaid Other | Admitting: Internal Medicine

## 2021-10-14 DIAGNOSIS — F411 Generalized anxiety disorder: Secondary | ICD-10-CM | POA: Diagnosis not present

## 2021-10-14 DIAGNOSIS — F331 Major depressive disorder, recurrent, moderate: Secondary | ICD-10-CM | POA: Diagnosis not present

## 2021-10-14 MED ORDER — BUPROPION HCL ER (XL) 300 MG PO TB24
300.0000 mg | ORAL_TABLET | ORAL | 2 refills | Status: DC
  Filled 2021-10-14 – 2021-10-26 (×2): qty 30, 30d supply, fill #0

## 2021-10-14 NOTE — Patient Instructions (Signed)
Thank you for attending your appointment today.  -- INCREASE Wellbutrin XL to 300 mg daily -- Use Atarax 12.5-25 mg up to three times daily as needed for panic attacks  -- Continue other medications as prescribed  Please do not make any changes to medications without first discussing with your provider. If you are experiencing a psychiatric emergency, please call 911 or present to your nearest emergency department. Additional crisis, medication management, and therapy resources are included below.  [facility]  [address] [place], [place] [address] [phone] or [phone] [facility] 24/7 FOR ANYONE [address], [place], [place] Fax: [phone] [email or web address] *Interpreters available *Accepts all insurance and uninsured for Urgent Care needs *Accepts Medicaid and uninsured for outpatient treatment (below)      ONLY FOR [facility]  Below:    Outpatient New Patient Assessment/Therapy Walk-ins:        Monday -Thursday 8am until slots are full.        Every Friday 1pm-4pm  (first come, first served)                   New Patient Psychiatry/Medication Management        Monday-Friday 8am-11am (first come, first served)               For all walk-ins we ask that you arrive by 7:15am, because patients will be seen in the order of arrival.

## 2021-10-14 NOTE — Progress Notes (Deleted)
[name], female   DOB: Dec 07, 1970, 51 y.o.   MRN: [id number]  HPI: Jennifer Hogan is a 51 y.o.-year-old female, returning for follow-up for DM1, diagnosed in 1984, uncontrolled, with complications: CAD, s/p AMI, s/p PTCA and DES, ischemic cardiomyopathy, DKA, gastroparesis.  She previously saw Dr. Loanne Drilling, last visit 11/2020 (video).  First visit with me 3 months ago.  Interim history: She continues to have abdominal pain and vomiting - no nausea. On Reglan daily now. Sees GI. She has increased urination as she drinks a lot of water.  She also has blurry vision (diabetic retinopathy and cataract in the right eye).   Reviewed HbA1c levels: Lab Results  Component Value Date   HGBA1C 8.0 (A) 07/01/2021   HGBA1C 8.7 (A) 03/25/2021   HGBA1C 8.5 (A) 10/11/2020   Insulin pump:  -since 2014 -Medtronic  CGM: -The Medtronic sensor was not affordable - **  Insulin: -Humalog  Supplies: -Medtronic  Pump settings: - basal rates: 12 am: 0.65 units/h 6 am: 0.675 10 am: 0.800 10 pm: 0.625 - ICR: 1:16 - target: 100-120 - ISF: 55 - Insulin on Board: 4 h - bolus wizard: on - extended bolusing: not using - changes infusion site: q3 days TDD from basal insulin: 60% TDD from bolus insulin: 40% Total daily dose: 25-50 units  Meter: Contour Next  Lowest sugar was 40 - at night; she has hypoglycemia awareness at 70.  She does not have a glucagon kit at home. No previous hypoglycemia admission.  Highest sugar was 600. + previous DKA admissions: 2020, 2021, 06/27/2021 (CGM defective).  - no CKD, last BUN/creatinine:  Lab Results  Component Value Date   BUN 12 09/22/2021   BUN 10 08/15/2021   CREATININE 1.08 (H) 09/22/2021   CREATININE 0.84 08/15/2021   - + HL; last set of lipids: Lab Results  Component Value Date   CHOL 298 (H) 08/15/2021   HDL 76 08/15/2021   LDLCALC 203 (H) 08/15/2021   LDLDIRECT 109 (H) 12/22/2019   TRIG 111 08/15/2021   CHOLHDL 3.9  08/15/2021  She tried Lipitor >> joint pain, mm aches. Now sees the lipid clinic - started Praluent.  - last eye exam was in 04/2021. + DR reportedly-needs records.   - no numbness and tingling in her feet.  Last foot exam 10/08/2020.  Hypothyroidism:  Pt is on levothyroxine 100 mcg daily, taken: - in am - fasting - at least 60 min from b'fast - no calcium - no iron - no multivitamins - no PPIs - not on Biotin  Last TSH: Lab Results  Component Value Date   TSH 2.505 05/15/2021   She also has acid reflux.  She is on Reglan as needed for nausea.  ROS: + see HPI  Past Medical History:  Diagnosis Date   Allergy    Anemia    Anxiety    Asthma    Cataract    CHF (congestive heart failure) (HCC)    Coronary artery disease    a. s/p PTCA DES x3 in the LAD (ostial DES placed, mid DES placed, distal DES placed) and PTCA/DES x1 to intermediate branch 02/2017. b. ACS 02/08/18 with LCx stent placement.   Depression    Diabetes mellitus    type 1, patient is not using her insulin pump.   DKA (diabetic ketoacidoses) 12/31/2017   Dyspnea    with exertion and dust, no oxygen   Elevated platelet count    GERD (gastroesophageal reflux disease)  Heart murmur    High cholesterol    Hypothyroidism    Ischemic cardiomyopathy    a. EF low-normal with basal inferior akinesis, basal septal hypokinesis by echo 01/2018.   Myocardial infarction [facility]) 2019   Substance abuse [facility])    Past Surgical History:  Procedure Laterality Date   APPENDECTOMY     BIOPSY  09/29/2021   Procedure: BIOPSY;  Surgeon: Yetta Flock, MD;  Location: [facility];  Service: Gastroenterology;;   COLONOSCOPY     COLONOSCOPY WITH PROPOFOL N/A 09/29/2021   Procedure: COLONOSCOPY WITH PROPOFOL;  Surgeon: Yetta Flock, MD;  Location: [facility];  Service: Gastroenterology;  Laterality: N/A;   CORONARY ARTERY BYPASS GRAFT N/A 11/04/2018   Procedure: CORONARY ARTERY BYPASS GRAFTING (CABG), ON PUMP,  TIMES THREE, USING LEFT AND RIGHT INTERNAL MAMMARY ARTERIES;  Surgeon: Wonda Olds, MD;  Location: [place];  Service: Open Heart Surgery;  Laterality: N/A;   CORONARY STENT INTERVENTION N/A 03/20/2017   Procedure: DES x 3 LAD, DES OM; Surgeon: Burnell Blanks, MD;  Location: [place] [facility];  Service: Cardiovascular;  Laterality: N/A;   CORONARY/GRAFT ACUTE MI REVASCULARIZATION N/A 02/08/2018   Procedure: Coronary/Graft Acute MI Revascularization;  Surgeon: Belva Crome, MD;  Location: [place] [facility];  Service: Cardiovascular;  Laterality: N/A;   ESOPHAGOGASTRODUODENOSCOPY (EGD) WITH PROPOFOL N/A 09/29/2021   Procedure: ESOPHAGOGASTRODUODENOSCOPY (EGD) WITH PROPOFOL;  Surgeon: Yetta Flock, MD;  [facility]: [facility];  Service: Gastroenterology;  Laterality: N/A;   GAS INSERTION Right 04/28/2021   Procedure: INSERTION OF GAS ( C3F8);  Surgeon: Jalene Mullet, MD;  Location: [place];  Service: Ophthalmology;  Laterality: Right;  Right eye   GAS/FLUID EXCHANGE Right 04/28/2021   Procedure: GAS/FLUID EXCHANGE;  Surgeon: Jalene Mullet, MD;  Location: [place];  Service: Ophthalmology;  Laterality: Right;  Right eye   LEFT HEART CATH AND CORONARY ANGIOGRAPHY N/A 03/20/2017   Procedure: LEFT HEART CATH AND CORONARY ANGIOGRAPHY;  Surgeon: Burnell Blanks, MD;  Location: [place] [facility];  Service: Cardiovascular;  Laterality: N/A;   LEFT HEART CATH AND CORONARY ANGIOGRAPHY N/A 02/08/2018   Procedure: LEFT HEART CATH AND CORONARY ANGIOGRAPHY;  Surgeon: Belva Crome, MD;  Location: [place] [facility];  Service: Cardiovascular;  Laterality: N/A;   LEFT HEART CATH AND CORONARY ANGIOGRAPHY N/A 10/29/2018   Procedure: LEFT HEART CATH AND CORONARY ANGIOGRAPHY;  Surgeon: Burnell Blanks, MD;  Location: [place] [facility];  Service: Cardiovascular;  Laterality: N/A;   LEFT HEART CATH AND CORS/GRAFTS ANGIOGRAPHY N/A 10/04/2020   Procedure: LEFT HEART CATH AND  CORS/GRAFTS ANGIOGRAPHY;  Surgeon: Sherren Mocha, MD;  Location: [place] [facility];  Service: Cardiovascular;  Laterality: N/A;   LEFT HEART CATH AND CORS/GRAFTS ANGIOGRAPHY N/A 05/11/2021   Procedure: LEFT HEART CATH AND CORS/GRAFTS ANGIOGRAPHY;  Surgeon: Martinique, Peter M, MD;  Location: [place] [facility];  Service: Cardiovascular;  Laterality: N/A;   PARS PLANA VITRECTOMY Right 04/28/2021   Procedure: PARS PLANA VITRECTOMY WITH 25 GAUGE REMOVAL OF TRATIONAL MEMBRANE RIGHT EYE; DRAINAGE OF SUBRETINAL FLUID RIGHT EYE;  Surgeon: Jalene Mullet, MD;  Location: [place];  Service: Ophthalmology;  Laterality: Right;  Right eye   PHOTOCOAGULATION WITH LASER Right 04/28/2021   Procedure: PHOTOCOAGULATION WITH LASER;  Surgeon: Jalene Mullet, MD;  Location: [place];  Service: Ophthalmology;  Laterality: Right;  Right eye   POLYPECTOMY  09/29/2021   Procedure: POLYPECTOMY;  Surgeon: Yetta Flock, MD;  [facility];  Service: Gastroenterology;;   TEE WITHOUT CARDIOVERSION N/A 11/04/2018   Procedure: TRANSESOPHAGEAL ECHOCARDIOGRAM (TEE);  Surgeon: Wonda Olds, MD;  Location: [place];  Service: Open Heart Surgery;  Laterality: N/A;   Social History   Socioeconomic History   Marital status: Divorced    Spouse name: Not on file   Number of children: 0   Years of education: Not on file   Highest education level: Professional school degree (e.g., MD, DDS, DVM, JD)  Occupational History   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"   Occupation: adjunct facilty at [employer]    Comment: Chemistry professor  Tobacco Use   Smoking status: Former    Types: Cigarettes   Smokeless tobacco: Never   Tobacco comments:    Smoked cigarettes in her 20's for 1 year  Vaping Use   Vaping Use: Never used  Substance and Sexual Activity   Alcohol use: Yes    Comment: Socially   Drug use: Yes    Types: Marijuana    Comment: 09/16/21   Sexual activity: Not Currently    Birth control/protection: None   Other Topics Concern   Not on file  Social History Narrative   Not on file   Social Determinants of Health   Financial Resource Strain: High Risk (10/05/2020)   Overall Financial Resource Strain (CARDIA)    Difficulty of Paying Living Expenses: Very hard  Food Insecurity: Food Insecurity Present (10/05/2020)   Hunger Vital Sign    Worried About [place] in the Last Year: Sometimes true    Ran Out of Food in the Last Year: Sometimes true  Transportation Needs: No Transportation Needs (10/05/2020)   PRAPARE - Hydrologist (Medical): No    Lack of Transportation (Non-Medical): No  Physical Activity: Not on file  Stress: Not on file  Social Connections: Unknown (12/07/2017)   Social Connection and Isolation Panel [NHANES]    Frequency of Communication with Friends and Family: Patient refused    Frequency of Social Gatherings with Friends and Family: Patient refused    Attends Religious Services: Patient refused    Active Member of Clubs or Organizations: Patient refused    Attends Archivist Meetings: Patient refused    Marital Status: Patient refused  Intimate Partner Violence: Not At Risk (12/07/2017)   Humiliation, Afraid, Rape, and Kick questionnaire    Fear of Current or Ex-Partner: No    Emotionally Abused: No    Physically Abused: No    Sexually Abused: No   Current Outpatient Medications on File Prior to Visit  Medication Sig Dispense Refill   acetaminophen (TYLENOL) 325 MG tablet Take 650 mg by mouth daily as needed for mild pain, fever or headache.     albuterol (VENTOLIN HFA) 108 (90 Base) MCG/ACT inhaler INHALE 2 PUFFS INTO THE LUNGS EVERY 6 (SIX) HOURS AS NEEDED FOR WHEEZING OR SHORTNESS OF BREATH. 8.5 g 2   Alirocumab (PRALUENT) 150 MG/ML SOAJ Inject 1 pen  into the skin every 14 (fourteen) days. 2 mL 11   aspirin 81 MG EC tablet Take 1 tablet (81 mg total) by mouth daily. 90 tablet 0   b complex vitamins tablet Take 1  tablet by mouth 3 (three) times a week.     budesonide (PULMICORT) 0.25 MG/2ML nebulizer solution Take 0.25 mg by nebulization as needed (shortness of breath/wheezing).     buPROPion (WELLBUTRIN XL) 150 MG 24 hr tablet Take 1 tablet (150 mg  total) by mouth in the morning. 30 tablet 2   clopidogrel (PLAVIX) 75 MG tablet TAKE 1 TABLET BY MOUTH DAILY WITH BREAKFAST. 90 tablet 3   diclofenac Sodium (VOLTAREN) 1 % GEL Apply 2-4 g topically as needed (knee pain).     diphenhydrAMINE (BENADRYL) 25 MG tablet Take 37.5 mg by mouth at bedtime.     fluticasone (FLONASE) 50 MCG/ACT nasal spray Place 1 spray into both nostrils at bedtime as needed for allergies.     furosemide (LASIX) 20 MG tablet Take 1 tablet (20 mg total) by mouth daily as needed (for weight gain). 15 tablet 3   Glucagon 3 MG/DOSE POWD Place 3 mg into the nose once as needed for up to 1 dose. 1 each 11   hydrOXYzine (ATARAX) 25 MG tablet Take 1 tablet (25 mg total) by mouth 3 (three) times daily as needed. 90 tablet 1   insulin lispro (HUMALOG) 100 UNIT/ML injection Inject 35 units into the skin daily via pump 10 mL 1   levothyroxine (SYNTHROID) 100 MCG tablet TAKE 1 TABLET (100 MCG TOTAL) BY MOUTH DAILY BEFORE BREAKFAST. 90 tablet 3   metoCLOPramide (REGLAN) 5 MG tablet Take 1 tablet (5 mg total) by mouth every 12 (twelve) hours as needed for nausea or vomiting. 60 tablet 3   metoprolol succinate (TOPROL-XL) 25 MG 24 hr tablet Take 0.5 tablets (12.5 mg total) by mouth daily. 15 tablet 6   montelukast (SINGULAIR) 10 MG tablet Take 1 tablet (10 mg total) by mouth at bedtime. 90 tablet 2   Multiple Vitamin (MULTIVITAMIN WITH MINERALS) TABS tablet Take 1 tablet by mouth daily.     nitroGLYCERIN (NITROSTAT) 0.4 MG SL tablet Place 1 tablet (0.4 mg total) under the tongue every 5 (five) minutes as needed for chest pain. 25 tablet 6   omeprazole (PRILOSEC OTC) 20 MG tablet Take 20 mg by mouth daily as needed (acid reflux).     OVER THE COUNTER  MEDICATION Take 3 capsules by mouth daily before breakfast. [employer] Super Lysine Immune Support     spironolactone (ALDACTONE) 25 MG tablet Take 12.5 mg by mouth daily.     No current facility-administered medications on file prior to visit.   Allergies  Allergen Reactions   Atorvastatin Other (See Comments)    Myalgias    Crestor [Rosuvastatin Calcium] Other (See Comments)    Severe myalgias and joint pain. Has tolerated atorvastatin though   Monosodium Glutamate Nausea And Vomiting and Other (See Comments)    migraine   Zetia [Ezetimibe] Other (See Comments)    myalgias   Ciprofloxin Hcl [Ciprofloxacin] Nausea And Vomiting and Other (See Comments)    Severe migraine   Food Color Red [Red Dye] Diarrhea   Family History  Problem Relation Age of Onset   Cancer Mother        T cell lymphoma   Hypertension Mother    Hypercalcemia Mother    OCD Father    Anxiety disorder Father    Coronary artery disease Father    Congestive Heart Failure Father    Asthma Father    Hypercalcemia Father    Hypertension Father    Colon polyps Father    Depression Maternal Grandmother    Diabetes Paternal Grandmother    Stomach cancer Neg Hx    Esophageal cancer Neg Hx    Colon cancer Neg Hx    Rectal cancer Neg Hx    PE: LMP 09/26/2021 (Exact Date)  Wt Readings from Last  3 Encounters:  09/29/21 148 lb (67.1 kg)  09/28/21 148 lb 6.4 oz (67.3 kg)  09/12/21 145 lb 6.4 oz (66 kg)   Constitutional: normal weight, anxious appearing Eyes: EOMI, no exophthalmos ENT: moist mucous membranes, no masses palpated in neck, no cervical lymphadenopathy Cardiovascular: RRR, No MRG Respiratory: CTA B Musculoskeletal: no deformities Skin: moist, warm, + vitiligo on arms and chest Neurological: no tremor with outstretched hands  ASSESSMENT: 1. DM1, uncontrolled, with complications - DKA - CAD, s/p AMI, s/p PTCA - DES; s/p NSTEMI 0418/2023 - iCMP - gastroparesis  2. Hypothyroidism  PLAN:   1. Patient with longstanding, uncontrolled, type 1 diabetes, uncontrolled.  At last visit, HbA1c was better, at 8.0%, but still above target.  I saw her after she was admitted for DKA and she mentioned that this was due to the fact that her CGM was malfunctioning, showing her blood sugars in the 60s which triggered her to overcorrect with regular soda and then she started to feel very poorly >> blood sugars in the ER were in the 600s. -At last visit, sugars are fluctuating, but usually higher after lunch and especially after dinner.  At that time, we identified several problems: not bolusing 15 minutes before meals, but many times, she boluses when the sugars are already up after a meal.  As a consequence, her sugars are dropping too low afterwards.  She does have gastroparesis and we discussed about trying to bolus 15 minutes before each meal, however, to move the boluses closer to the meal if needed depending on the blood sugars.  However, entering carbs and taking the bolus when the sugars already high after a meal is not conducive to good diabetes control Sugars are dropping overnight but this is most likely due to bolusing for dinner too late as mentioned above She is not entering all of the carbs into the pump.  We discussed about trying to enter blood sugars, enter carbs, and bolus before every meal. She changes her insulin in the reservoir and her infusion site less than every 7 days.  We discussed about inflammation at the site and inconsistent absorption if she does that.  She was trying to ration her supplies from [employer]tr[employer], but she is waiting for new supplies now.   She is not in the auto mode.  She states in the manual mode and she did not notice that.  I advised her to start the automatic mode She is stopping the pump for several hours a day.  We discussed that this is dangerous.  She mentions that many times when she sees a low blood sugar, especially during the night, she stops the pump and  that she forgets to turn it on.  We discussed that she should not stop the pump, but correct the low blood sugar with scars and leave the pump going. CGM interpretation: -At today's visit, we reviewed her CGM downloads: It appears that *** of values are in target range (goal >70%), while *** are higher than 180 (goal <25%), and *** are lower than 70 (goal <4%).  The calculated average blood sugar is ***.  The projected HbA1c for the next 3 months (GMI) is ***. -Reviewing the CGM trends, ***  - I suggested to: Patient Instructions  Please use the following pump settings: - basal rates: 12 am: 0.650 units/h 6 am: 0.675 10 am: 0.800 10 pm: 0.625 - ICR: 1:16 - target: 100-120 - ISF: 55 - Insulin on Board: 4h  Please do the  following approximately 15 minutes before every meal: - Enter carbs (C) - Enter sugars (S) - Start insulin bolus (I)  Please continue Levothyroxine 100 mcg daily.  Take the thyroid hormone every day, with water, at least 30 minutes before breakfast, separated by at least 4 hours from: - acid reflux medications - calcium - iron - multivitamins  Please return in 3-4 months.  - we checked her HbA1c: 7%  - advised to check sugars at different times of the day - 4x a day, rotating check times - advised for yearly eye exams >> she is UTD - return to clinic in 3-4 months  2.  Hypothyroidism - latest thyroid labs reviewed with pt. >> normal: Lab Results  Component Value Date   TSH 2.505 05/15/2021  - she continues on LT4 100 mcg daily - pt feels good on this dose. - we discussed about taking the thyroid hormone every day, with water, >30 minutes before breakfast, separated by >4 hours from acid reflux medications, calcium, iron, multivitamins. Pt. is taking it correctly. - will check thyroid tests today: TSH and fT4 - If labs are abnormal, she will need to return for repeat TFTs in 1.5 months  Philemon Kingdom, MD PhD [facility] Endocr[facility]ology

## 2021-10-14 NOTE — Progress Notes (Signed)
[facility] MD Outpatient Progress Note  10/14/2021 10:42 AM Jennifer Hogan  MRN:  [id number]  Assessment:  Jennifer Hogan presents for follow-up evaluation. Today, 10/14/21, patient reports worsening irritability, fatigue, and generalized body aches this interval although overall motivation, energy, and functionality have continued to improve and she has began substitute teaching part time. Continues to experience daily anxiety although notes continued benefit of medication and time in [facility] for challenging anxious thoughts. Continues to experience passive SI but denies active SI; no acute safety concern at this time.  Discussed that irritability could represent symptom of untreated depression and anxiety versus side effect to Wellbutrin; given benefit of Wellbutrin thus far she is amenable to further increase and will assess impact on irritability carefully.  Patient notes infrequent use although benefit from hydroxyzine and was encouraged to use this as needed for anxiety attacks.  Referral to psychotherapy was placed, and patient identifies intent to start attending group therapy sessions through [facility].  Plan to return to care in 6 weeks.  Identifying Information: Jennifer Hogan is a 51 y.o. y.o. female with a history of MDD, GAD, childhood trauma, T1DM with diabetic retinopathy and on insulin pump, CHF Stage 3, CAD s/p CABG 11/2018, and hypothyroidism who is an established patient with [place] participating in follow-up via video conferencing. Patient was recently discharged from [facility] partial hospitalization program (participated 08/25/21-09/20/21) for management of worsening anxiety and depression. During that time, she participated in therapy and started on Wellbutrin, noting  significant benefit from [facility] program for mood and anxiety and demonstrating substantial insight on exam to contributors to mental health symptoms.   Plan:  # Major depressive  disorder  Generalized anxiety disorder Past medication trials: Prozac 40 mg daily (ineffective), Effexor XR Status of problem: acute exacerbation Interventions: -- INCREASE Wellbutrin XL from 150 to 300 mg daily (s8/8/23; i9/22/23) -- Encouraged to use Atarax 25 mg TID PRN panic attacks (has been using infrequently)  -- Referral for individual psychotherapy placed -- Patient plans to begin attending group therapy sessions through [facility]   # Cannabis use Status of problem: Improving Interventions: -- Continue to monitor use and provide psychoeducation   # History of alcohol use Past medication trials: None Status of problem: In remission Interventions: -- Patient reports social use but denies risky or escalating drinking behaviors; continue to monitor and support sobriety  Patient was given contact information for behavioral health clinic and was instructed to call 911 for emergencies.   Subjective:  Chief Complaint:  Chief Complaint  Patient presents with   Medication Management    Interval History:   Chart review: Seen by [facility] 09/28/21: generalized body aches Seen by [facility] gastroenterology 09/29/21: s/p EGD and colonoscopy to evaluate recurrent N/V, epigastric pain   -- Findings: EGD wnl; normal biopsies of small intestine; benign hyperplastic polyp of stomach; negative for H/ pylori ; small adenoma on colonoscopy with plan to repeat in 7 years -- Plan for [facility] due to concern for gastroparesis   Jennifer Hogan reports she has been feeling additional aches and pains; wakes up feeling increasingly sore. Worries she may be developing an autoimmune issue. Notes sleeping on couch but feels this is more comfortable than her bed. States pain has made her more irritable but also wonders if mood may be impacting pain. Has began substitute teaching part time - helpful for getting out of the house but has noted feeling exhausted at the end of the day.  When  previously depressed, felt numb and disinterested and denies significant irritability. Feels Wellbutrin has been helpful for increasing energy and interest in getting out of the house. Has been cleaning and cooking more. Anxiety is "always there" but feels that partial hospitalization and Wellbutrin have allowed her to stop ruminative thoughts more easily and maintain a healthier perspective. Endorses passive SI of ("I'm tired of having to try so hard') but denies active SI and identifies impact suicide would have on others.   Denies recent etoh use. Has decreased cannabis use and does not smoke on weeks when she is teaching. Smokes a small amount on the weekends but feels that sleep has been improved since reducing cannabis use. Uses benadryl at night for allergies/sleep. Used Atarax a few weeks ago for panic and found it helpful for anxiety. Denies excessive sedation or side effects. Encouraged to use during the day as needed but counseled not to combine with Benadryl at night.   Remains interested in therapy and amenable to referral. Plans to reach out to [facility] to attend group therapy.  Discussed that irritability could represent symptom of untreated depression and anxiety versus side effect of Wellbutrin.  She is amenable to further increase in Wellbutrin given benefits thus far and will monitor carefully to assess response and irritability.  Risks, benefits, and side effects of Wellbutrin increase including but not limited to HA, increased blood pressure, insomnia, nausea, and worsening irritability/anxiety were discussed with informed consent provided.   Visit Diagnosis:    ICD-10-CM   1. GAD (generalized anxiety disorder)  F41.1     2. 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 Attended a mandated 2 week outpatient recovery program.   Past Medical History:  Past Medical History:  Diagnosis Date   Allergy    Anemia    Anxiety    Asthma    Cataract    CHF (congestive heart failure) ([place])    Coronary artery disease    a. s/p PTCA DES x3 in the LAD (ostial DES placed, mid DES placed, distal DES placed) and PTCA/DES x1 to intermediate branch 02/2017. b. ACS 02/08/18 with LCx stent placement.   Depression    Diabetes mellitus    type 1, patient is not using her insulin pump.   DKA (diabetic ketoacidoses) 12/31/2017   Dyspnea    with exertion and dust, no oxygen   Elevated platelet count    GERD (gastroesophageal reflux disease)    Heart murmur    High cholesterol    Hypothyroidism    Ischemic cardiomyopathy    a. EF low-normal with basal inferior akinesis, basal septal hypokinesis by echo 01/2018.   Myocardial infarction [facility]) 2019   Substance abuse [facility])     Past Surgical History:  Procedure Laterality Date   APPENDECTOMY     BIOPSY  09/29/2021   Procedure: BIOPSY;  Surgeon: Yetta Flock, MD;  [facility]: [facility];  Service: Gastroenterology;;   COLONOSCOPY     COLONOSCOPY WITH PROPOFOL N/A 09/29/2021   Procedure: COLONOSCOPY WITH PROPOFOL;  Surgeon: Yetta Flock, MD;  [facility];  Service: Gastroenterology;  Laterality: N/A;   CORONARY ARTERY BYPASS GRAFT N/A 11/04/2018   Procedure: CORONARY ARTERY BYPASS GRAFTING (CABG), ON PUMP, TIMES THREE, USING LEFT AND RIGHT INTERNAL MAMMARY ARTERIES;  Surgeon: Wonda Olds, MD;  Location: [place];  Service: Open Heart Surgery;   Laterality: N/A;   CORONARY STENT INTERVENTION N/A 03/20/2017   Procedure: DES x 3 LAD, DES OM; Surgeon: Burnell Blanks, MD;  Location: [place] CV LAB;  Service: Cardiovascular;  Laterality: N/A;   CORONARY/GRAFT ACUTE MI REVASCULARIZATION N/A 02/08/2018   Procedure: Coronary/Graft Acute MI Revascularization;  Surgeon: Belva Crome, MD;  Location: [place] CV LAB;  Service: Cardiovascular;  Laterality: N/A;   ESOPHAGOGASTRODUODENOSCOPY (EGD) WITH PROPOFOL N/A 09/29/2021   Procedure: ESOPHAGOGASTRODUODENOSCOPY (EGD) WITH PROPOFOL;  Surgeon: Yetta Flock, MD;  [facility];  Service: Gastroenterology;  Laterality: N/A;   GAS INSERTION Right 04/28/2021   Procedure: INSERTION OF GAS ( C3F8);  Surgeon: Jalene Mullet, MD;  Location: [place];  Service: Ophthalmology;  Laterality: Right;  Right eye   GAS/FLUID EXCHANGE Right 04/28/2021   Procedure: GAS/FLUID EXCHANGE;  Surgeon: Jalene Mullet, MD;  Location: [place];  Service: Ophthalmology;  Laterality: Right;  Right eye   LEFT HEART CATH AND CORONARY ANGIOGRAPHY N/A 03/20/2017   Procedure: LEFT HEART CATH AND CORONARY ANGIOGRAPHY;  Surgeon: Burnell Blanks, MD;  Location: [place] CV LAB;  Service: Cardiovascular;  Laterality: N/A;   LEFT HEART CATH AND CORONARY ANGIOGRAPHY N/A 02/08/2018   Procedure: LEFT HEART CATH AND CORONARY ANGIOGRAPHY;  Surgeon: Belva Crome, MD;  Location: [place] CV LAB;  Service: Cardiovascular;  Laterality: N/A;   LEFT HEART CATH AND CORONARY ANGIOGRAPHY N/A 10/29/2018   Procedure: LEFT HEART CATH AND CORONARY ANGIOGRAPHY;  Surgeon: Burnell Blanks, MD;  Location: [place] CV LAB;  Service: Cardiovascular;  Laterality: N/A;   LEFT HEART CATH AND CORS/GRAFTS ANGIOGRAPHY N/A 10/04/2020   Procedure: LEFT HEART CATH AND CORS/GRAFTS ANGIOGRAPHY;  Surgeon: Sherren Mocha, MD;  Location: [place] CV LAB;  Service: Cardiovascular;  Laterality: N/A;   LEFT HEART CATH AND  CORS/GRAFTS ANGIOGRAPHY N/A 05/11/2021   Procedure: LEFT HEART CATH AND CORS/GRAFTS ANGIOGRAPHY;  Surgeon: Martinique, Peter M, MD;  Location: [place] CV LAB;  Service: Cardiovascular;  Laterality: N/A;   PARS PLANA VITRECTOMY Right 04/28/2021   Procedure: PARS PLANA VITRECTOMY WITH 25 GAUGE REMOVAL OF TRATIONAL MEMBRANE RIGHT EYE; DRAINAGE OF SUBRETINAL FLUID RIGHT EYE;  Surgeon: Jalene Mullet, MD;  Location: [place];  Service: Ophthalmology;  Laterality: Right;  Right eye   PHOTOCOAGULATION WITH LASER Right 04/28/2021   Procedure: PHOTOCOAGULATION WITH LASER;  Surgeon: Jalene Mullet, MD;  Location: [place];  Service: Ophthalmology;  Laterality: Right;  Right eye   POLYPECTOMY  09/29/2021   Procedure: POLYPECTOMY;  Surgeon: Yetta Flock, MD;  [facility];  Service: Gastroenterology;;   TEE WITHOUT CARDIOVERSION N/A 11/04/2018   Procedure: TRANSESOPHAGEAL ECHOCARDIOGRAM (TEE);  Surgeon: Wonda Olds, MD;  Location: [place];  Service: Open Heart Surgery;  Laterality: N/A;    Family Psychiatric History: Father: etoh use disorder, anxiety Grandmother: depression, was treated with ECT later in her life  Family History:  Family History  Problem Relation Age of Onset   Cancer Mother  T cell lymphoma   Hypertension Mother    Hypercalcemia Mother    OCD Father    Anxiety disorder Father    Coronary artery disease Father    Congestive Heart Failure Father    Asthma Father    Hypercalcemia Father    Hypertension Father    Colon polyps Father    Depression Maternal Grandmother    Diabetes Paternal Grandmother    Stomach cancer Neg Hx    Esophageal cancer Neg Hx    Colon cancer Neg Hx    Rectal cancer Neg Hx     Social History:  Social History   Socioeconomic History   Marital status: Divorced    Spouse name: Not on file   Number of children: 0   Years of education: Not on file   Highest education level: Professional school degree (e.g., MD, DDS, DVM, JD)   Occupational History   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"   Occupation: adjunct facilty at [employer]    Comment: Chemistry professor  Tobacco Use   Smoking status: Former    Types: Cigarettes   Smokeless tobacco: Never   Tobacco comments:    Smoked cigarettes in her 20's for 1 year  Vaping Use   Vaping Use: Never used  Substance and Sexual Activity   Alcohol use: Yes    Comment: Socially   Drug use: Yes    Types: Marijuana    Comment: a few times weekly   Sexual activity: Not Currently    Birth control/protection: None  Other Topics Concern   Not on file  Social History Narrative   Not on file   Social Determinants of Health   Financial Resource Strain: High Risk (10/05/2020)   Overall Financial Resource Strain (CARDIA)    Difficulty of Paying Living Expenses: Very hard  Food Insecurity: Food Insecurity Present (10/05/2020)   Hunger Vital Sign    Worried About Running Out of Food in the Last Year: Sometimes true    Ran Out of Food in the Last Year: Sometimes true  Transportation Needs: No Transportation Needs (10/05/2020)   PRAPARE - Hydrologist (Medical): No    Lack of Transportation (Non-Medical): No  Physical Activity: Not on file  Stress: Not on file  Social Connections: Unknown (12/07/2017)   Social Connection and Isolation Panel [NHANES]    Frequency of Communication with Friends and Family: Patient refused    Frequency of Social Gatherings with Friends and Family: Patient refused    Attends Religious Services: Patient refused    Active Member of Clubs or Organizations: Patient refused    Attends Archivist Meetings: Patient refused    Marital Status: Patient refused    Allergies:  Allergies  Allergen Reactions   Atorvastatin Other (See Comments)    Myalgias    Crestor [Rosuvastatin Calcium] Other (See Comments)    Severe myalgias and joint pain. Has tolerated atorvastatin though   Monosodium Glutamate Nausea And  Vomiting and Other (See Comments)    migraine   Zetia [Ezetimibe] Other (See Comments)    myalgias   Ciprofloxin Hcl [Ciprofloxacin] Nausea And Vomiting and Other (See Comments)    Severe migraine   Food Color Red [Red Dye] Diarrhea    Current Medications: Current Outpatient Medications  Medication Sig Dispense Refill   buPROPion (WELLBUTRIN XL) 150 MG 24 hr tablet Take 1 tablet (150 mg total) by mouth in the morning. 30 tablet 2   buPROPion [facility]  XL) 300 MG 24 hr tablet Take 1 tablet (300 mg total) by mouth every morning. 30 tablet 2   diphenhydrAMINE (BENADRYL) 25 MG tablet Take 37.5 mg by mouth at bedtime.     acetaminophen (TYLENOL) 325 MG tablet Take 650 mg by mouth daily as needed for mild pain, fever or headache.     albuterol (VENTOLIN HFA) 108 (90 Base) MCG/ACT inhaler INHALE 2 PUFFS INTO THE LUNGS EVERY 6 (SIX) HOURS AS NEEDED FOR WHEEZING OR SHORTNESS OF BREATH. 8.5 g 2   Alirocumab (PRALUENT) 150 MG/ML SOAJ Inject 1 pen  into the skin every 14 (fourteen) days. 2 mL 11   aspirin 81 MG EC tablet Take 1 tablet (81 mg total) by mouth daily. 90 tablet 0   b complex vitamins tablet Take 1 tablet by mouth 3 (three) times a week.     budesonide (PULMICORT) 0.25 MG/2ML nebulizer solution Take 0.25 mg by nebulization as needed (shortness of breath/wheezing).     clopidogrel (PLAVIX) 75 MG tablet TAKE 1 TABLET BY MOUTH DAILY WITH BREAKFAST. 90 tablet 3   diclofenac Sodium (VOLTAREN) 1 % GEL Apply 2-4 g topically as needed (knee pain).     fluticasone (FLONASE) 50 MCG/ACT nasal spray Place 1 spray into both nostrils at bedtime as needed for allergies.     furosemide (LASIX) 20 MG tablet Take 1 tablet (20 mg total) by mouth daily as needed (for weight gain). 15 tablet 3   Glucagon 3 MG/DOSE POWD Place 3 mg into the nose once as needed for up to 1 dose. 1 each 11   hydrOXYzine (ATARAX) 25 MG tablet Take 1 tablet (25 mg total) by mouth 3 (three) times daily as needed. 90 tablet 1    insulin lispro (HUMALOG) 100 UNIT/ML injection Inject 35 units into the skin daily via pump 10 mL 1   levothyroxine (SYNTHROID) 100 MCG tablet TAKE 1 TABLET (100 MCG TOTAL) BY MOUTH DAILY BEFORE BREAKFAST. 90 tablet 3   metoCLOPramide (REGLAN) 5 MG tablet Take 1 tablet (5 mg total) by mouth every 12 (twelve) hours as needed for nausea or vomiting. 60 tablet 3   metoprolol succinate (TOPROL-XL) 25 MG 24 hr tablet Take 0.5 tablets (12.5 mg total) by mouth daily. 15 tablet 6   montelukast (SINGULAIR) 10 MG tablet Take 1 tablet (10 mg total) by mouth at bedtime. 90 tablet 2   Multiple Vitamin (MULTIVITAMIN WITH MINERALS) TABS tablet Take 1 tablet by mouth daily.     nitroGLYCERIN (NITROSTAT) 0.4 MG SL tablet Place 1 tablet (0.4 mg total) under the tongue every 5 (five) minutes as needed for chest pain. 25 tablet 6   omeprazole (PRILOSEC OTC) 20 MG tablet Take 20 mg by mouth daily as needed (acid reflux).     OVER THE COUNTER MEDICATION Take 3 capsules by mouth daily before breakfast. [employer] Super Lysine Immune Support     spironolactone (ALDACTONE) 25 MG tablet Take 12.5 mg by mouth daily.     No current facility-administered medications for this visit.    ROS: Endorses generalized physical aches/pains.   Objective:  Psychiatric Specialty Exam: Last menstrual period 09/26/2021.There is no height or weight on file to calculate BMI.  General Appearance: Casual and Fairly Groomed  Eye Contact:  Good  Speech:  Clear and Coherent and Normal Rate  Volume:  Normal  Mood:   "tired"  Affect:  Full Range and Euthymic and Engaged  - appropriately tearful at times  Thought Process:  Goal Directed  and Linear  Orientation:  Full (Time, Place, and Person)  Thought Content: Denies hallucinations and no overt delusional content on interview  Suicidal Thoughts:   Endorses passive SI but denies active SI  Homicidal Thoughts:  No  Memory:   Grossly intact  Judgment:  Good  Insight:  Good   Psychomotor Activity:  Normal  Concentration:  Concentration: Grossly intact to interview  Recall:  NA  Fund of Knowledge: Good  Language: Good  Akathisia:  NA  Handed:  n/a  AIMS (if indicated): not done  Assets:  Communication Skills Desire for Improvement Financial Resources/Insurance Housing Resilience Talents/Skills  ADL's:  Intact  Cognition: WNL  Sleep:  Good   PE: General: sits comfortably in view of camera; no acute distress  Pulm: no increased work of breathing on room air  MSK: all extremity movements appear intact  Neuro: no focal neurological deficits observed  Gait & Station: unable to assess by video    Metabolic Disorder Labs: Lab Results  Component Value Date   HGBA1C 8.0 (A) 07/01/2021   MPG 197.25 10/01/2020   MPG 231.69 03/15/2019   No results found for: "PROLACTIN" Lab Results  Component Value Date   CHOL 298 (H) 08/15/2021   TRIG 111 08/15/2021   HDL 76 08/15/2021   CHOLHDL 3.9 08/15/2021   VLDL 13 05/11/2021   LDLCALC 203 (H) 08/15/2021   LDLCALC 167 (H) 05/11/2021   Lab Results  Component Value Date   TSH 2.505 05/15/2021   TSH 0.737 03/25/2021    Therapeutic Level Labs: No results found for: "LITHIUM" No results found for: "VALPROATE" No results found for: "CBMZ"  Screenings: GAD-7    Flowsheet Row Office Visit from 09/28/2021 in [facility] Counsel[facility] from 08/24/2021 in [facility] Office Visit from 06/27/2021 in [place] from 05/22/2019 in [place] Office Visit from 03/19/2019 in [place]  Total GAD-7 Score '18 17 18 14 19      '$ PHQ2-9    [place] Office Visit from 09/28/2021 in [place] from 09/13/2021 in [facility] Counselor from 09/05/2021 in [facility] Counselor from 08/25/2021 in [facility] Counselor from 08/24/2021 in [place]  PHQ-2 Total Score '3 3 3 6 4  '$ PHQ-9 Total Score '14 17 14 20 20      '$ Flowsheet Row Admission (Discharged) from 09/29/2021 in [place] Counselor from 08/25/2021 in [facility] Counselor from 08/24/2021 in [place] No Risk Error: Q3, 4, or 5 should not be populated when Q2 is No Error: Q3, 4, or 5 should not be populated when Q2 is No       Collaboration of Care: Collaboration of Care: Medication Management AEB active medication changes, Psychiatrist AEB established with this provider, and Other provider involved in patient's care AEB referral to psychotherapy  Patient/Guardian was advised Release of Information must be obtained prior to any record release in order to collaborate their care with an outside provider. Patient/Guardian was advised if they have not already done so to contact the registration department to sign all necessary forms in order for [name] to release information regarding their care.   Consent: Patient/Guardian gives verbal consent for treatment and assignment of benefits for  services provided during this visit. Patient/Guardian expressed understanding and agreed to proceed.   Televisit via video: I connected with patient on 10/14/21 at  9:30 AM EDT by a video enabled telemedicine application and verified that I am speaking with the correct person using two identifiers.  Location: Patient: home in [place] Provider: remote in [place]   I discussed the limitations of evaluation and management by telemedicine and the availability of in person appointments. The patient expressed understanding and agreed to proceed.  I discussed the assessment and treatment plan with the patient. The patient was provided an opportunity to ask questions and  all were answered. The patient agreed with the plan and demonstrated an understanding of the instructions.   The patient was advised to call back or seek an in-person evaluation if the symptoms worsen or if the condition fails to improve as anticipated.  I provided 45 minutes of non-face-to-face time during this encounter.  Otisha Spickler A  10/14/2021, 10:42 AM

## 2021-10-21 ENCOUNTER — Other Ambulatory Visit: Payer: Self-pay

## 2021-10-22 ENCOUNTER — Other Ambulatory Visit: Payer: Self-pay | Admitting: Family Medicine

## 2021-10-22 DIAGNOSIS — J9801 Acute bronchospasm: Secondary | ICD-10-CM

## 2021-10-22 NOTE — Psych (Signed)
Virtual Visit via Video Note  I connected with Jennifer Hogan on 08/30/21 at  9:05 AM EDT by a video enabled telemedicine application and verified that I am speaking with the correct person using two identifiers.  Location: Patient: patient home Provider: clinical home office   I discussed the limitations of evaluation and management by telemedicine and the availability of in person appointments. The patient expressed understanding and agreed to proceed.   I discussed the assessment and treatment plan with the patient. The patient was provided an opportunity to ask questions and all were answered. The patient agreed with the plan and demonstrated an understanding of the instructions.   The patient was advised to call back or seek an in-person evaluation if the symptoms worsen or if the condition fails to improve as anticipated.  Pt was provided 240 minutes of non-face-to-face time during this encounter.   Lorin Glass, LCSW   [facility] PHP THERAPIST PROGRESS NOTE  Jennifer Hogan [id number]  Session Time: 9:00 - 10:00  Participation Level: Active  Behavioral Response: CasualAlertDepressed  Type of Therapy: Group Therapy  Treatment Goals addressed: Coping  Progress Towards Goals: Progressing  Interventions: CBT, DBT, Supportive, and Reframing  Summary: Clinician led check-in regarding current stressors and situation, and review of patient completed daily inventory. Clinician utilized active listening and empathetic response and validated patient emotions. Clinician facilitated processing group on pertinent issues.?    Summary: Jennifer Hogan is a 51 y.o. female who presents with depression and anxiety symptoms. Patient arrived within time allowed. Patient rates her mood at a 7 on a scale of 1-10 with 10 being best. Pt states feeling "good."  Patient able to process. Patient engaged in discussion.         Session Time: 10:00 am - 11:00 am   Participation Level:  Active   Behavioral Response: CasualAlertDepressed   Type of Therapy: Group Therapy   Treatment Goals addressed: Coping   Progress Towards Goals: Progressing   Interventions: CBT, DBT, Solution Focused, Strength-based, Supportive, and Reframing   Therapist Response: Cln led discussion on personal standards and they way in which it impacts the way we view ourselves and our abilities. Group members discussed judgment, struggles, and barriers they experience in terms of personal standards. Cln brought in topics of balance, grace, and kindness. Cln proposed the "best friend test" as a way to calibrate whether we are viewing our situation with kindness or harshness.  Therapist Response: Pt engaged in discussion and reports willingness to utilize the best friend test         Session Time: 11:00 -12:00   Participation Level: Active   Behavioral Response: CasualAlertDepressed   Type of Therapy: Group Therapy, Occupational Therapy   Treatment Goals addressed: Coping   Progress Towards Goals: Progressing   Interventions: Supportive, Education   Summary:  Occupational Therapy group led by cln Jennifer Hogan.   Therapist Response: Pt participated       Session Time: 12:00 -1:00   Participation Level: Active   Behavioral Response: CasualAlertDepressed   Type of Therapy: Group therapy   Treatment Goals addressed: Coping   Progress Towards Goals: Progressing   Interventions: CBT; Solution focused; Supportive; Reframing   Summary: 12:00 - 12:50: Cln continued topic of boundaries. Cln discussed the different ways boundaries present: physical, emotional, intellectual, sexual, material, and time. Group talked about the ways in which each type presents for them and is a struggle.  12:50 -1:00 Clinician led check-out. Clinician assessed for immediate needs,  medication compliance and efficacy, and safety concerns.   Therapist Response: 12:00 - 12:50: Pt engaged in discussion. 12:50 -  1:00 pm: At check-out, patient reports no immediate concerns. Patient demonstrates progress as evidenced by working on mindset. Patient denies SI/HI/self-harm thoughts at the end of group.    Suicidal/Homicidal: Nowithout intent/plan  Plan: Pt will continue in PHP while working to decrease depression and anxiety symptoms, increase ADLs, and increase ability to manage symptoms in a healthy manner.   Collaboration of Care: Medication Management AEB V Doda  Patient/Guardian was advised Release of Information must be obtained prior to any record release in order to collaborate their care with an outside provider. Patient/Guardian was advised if they have not already done so to contact the registration department to sign all necessary forms in order for [name] to release information regarding their care.   Consent: Patient/Guardian gives verbal consent for treatment and assignment of benefits for services provided during this visit. Patient/Guardian expressed understanding and agreed to proceed.   Diagnosis: Severe episode of recurrent major depressive disorder, without psychotic features ([place]) [F33.2]    1. Severe episode of recurrent major depressive disorder, without psychotic features ([place])   2. GAD (generalized anxiety disorder)      Lorin Glass, LCSW

## 2021-10-22 NOTE — Psych (Signed)
Virtual Visit via Video Note  I connected with Jennifer Hogan on 09/07/21 at  9:05 AM EDT by a video enabled telemedicine application and verified that I am speaking with the correct person using two identifiers.  Location: Patient: patient home Provider: clinical home office   I discussed the limitations of evaluation and management by telemedicine and the availability of in person appointments. The patient expressed understanding and agreed to proceed.   I discussed the assessment and treatment plan with the patient. The patient was provided an opportunity to ask questions and all were answered. The patient agreed with the plan and demonstrated an understanding of the instructions.   The patient was advised to call back or seek an in-person evaluation if the symptoms worsen or if the condition fails to improve as anticipated.  Pt was provided 240 minutes of non-face-to-face time during this encounter.   Lorin Glass, LCSW   [facility] BH PHP THERAPIST PROGRESS NOTE  Jennifer Hogan [id number]  Session Time: 9:00 - 10:00  Participation Level: Active  Behavioral Response: CasualAlertDepressed  Type of Therapy: Group Therapy  Treatment Goals addressed: Coping  Progress Towards Goals: Progressing  Interventions: CBT, DBT, Supportive, and Reframing  Summary: Clinician led check-in regarding current stressors and situation, and review of patient completed daily inventory. Clinician utilized active listening and empathetic response and validated patient emotions. Clinician facilitated processing group on pertinent issues.?    Summary: Jennifer Hogan is a 51 y.o. female who presents with depression and anxiety symptoms. Patient arrived within time allowed. Patient rates her mood at a 4 on a scale of 1-10 with 10 being best. Pt states feeling "unsorted."  Patient able to process. Patient engaged in discussion.         Session Time: 10:00 am - 11:00 am   Participation Level:  Active   Behavioral Response: CasualAlertDepressed   Type of Therapy: Group Therapy   Treatment Goals addressed: Coping   Progress Towards Goals: Progressing   Interventions: CBT, DBT, Solution Focused, Strength-based, Supportive, and Reframing   Therapist Response: Cln led processing group for pt's current struggles. Group members shared stressors and provided support and feedback. Cln brought in topics of boundaries, healthy relationships, and unhealthy thought processes to inform discussion.    Therapist Response: Pt able to process and provide support to group.        Session Time: 11:00 -12:00   Participation Level: Active   Behavioral Response: CasualAlertDepressed   Type of Therapy: Group Therapy, Spiritual Care   Treatment Goals addressed: Coping   Progress Towards Goals: Progressing   Interventions: Supportive, Education   Summary:  Alain Marion, Chaplain, led group.   Therapist Response: Pt participated         Session Time: 12:00 -1:00   Participation Level: Active   Behavioral Response: CasualAlertDepressed   Type of Therapy: Group Therapy   Treatment Goals addressed: Coping   Progress Towards Goals: Progressing   Interventions: Supportive, Education   Summary:  Occupational Therapy group led by cln E. Hollan. 12:50 -1:00 Clinician led check-out. Clinician assessed for immediate needs, medication compliance and efficacy, and safety concerns   Therapist Response: 12:00 - 12:50: Pt participated 12:50 - 1:00 pm: At check-out, patient reports no immediate concerns. Patient demonstrates progress as evidenced by active efforts to manage. Patient denies SI/HI/self-harm thoughts at the end of group.    Suicidal/Homicidal: Nowithout intent/plan  Plan: Pt will continue in PHP while working to decrease depression and anxiety symptoms, increase ADLs, and  increase ability to manage symptoms in a healthy manner.   Collaboration of Care: Medication  Management AEB V Doda  Patient/Guardian was advised Release of Information must be obtained prior to any record release in order to collaborate their care with an outside provider. Patient/Guardian was advised if they have not already done so to contact the registration department to sign all necessary forms in order for [name] to release information regarding their care.   Consent: Patient/Guardian gives verbal consent for treatment and assignment of benefits for services provided during this visit. Patient/Guardian expressed understanding and agreed to proceed.   Diagnosis: Severe episode of recurrent major depressive disorder, without psychotic features ([place]) [F33.2]    1. Severe episode of recurrent major depressive disorder, without psychotic features ([place])   2. GAD (generalized anxiety disorder)      Lorin Glass, LCSW

## 2021-10-22 NOTE — Psych (Signed)
Virtual Visit via Video Note  I connected with [name] on 09/02/21 at  9:05 AM EDT by a video enabled telemedicine application and verified that I am speaking with the correct person using two identifiers.  Location: Patient: patient home Provider: clinical home office   I discussed the limitations of evaluation and management by telemedicine and the availability of in person appointments. The patient expressed understanding and agreed to proceed.   I discussed the assessment and treatment plan with the patient. The patient was provided an opportunity to ask questions and all were answered. The patient agreed with the plan and demonstrated an understanding of the instructions.   The patient was advised to call back or seek an in-person evaluation if the symptoms worsen or if the condition fails to improve as anticipated.  Pt was provided 240 minutes of non-face-to-face time during this encounter.   Lorin Glass, LCSW   [facility] PHP THERAPIST PROGRESS NOTE  Jennifer Hogan [id number]  Session Time: 9:00 - 10:00  Participation Level: Active  Behavioral Response: CasualAlertDepressed  Type of Therapy: Group Therapy  Treatment Goals addressed: Coping  Progress Towards Goals: Progressing  Interventions: CBT, DBT, Supportive, and Reframing  Summary: Clinician led check-in regarding current stressors and situation, and review of patient completed daily inventory. Clinician utilized active listening and empathetic response and validated patient emotions. Clinician facilitated processing group on pertinent issues.?    Summary: Jennifer Hogan is a 51 y.o. female who presents with depression and anxiety symptoms. Patient arrived within time allowed. Patient rates her mood at a 7 on a scale of 1-10 with 10 being best. Pt states feeling "groggy."  Patient able to process. Patient engaged in discussion.         Session Time: 10:00 am - 11:00 am   Participation Level:  Active   Behavioral Response: CasualAlertDepressed   Type of Therapy: Group Therapy   Treatment Goals addressed: Coping   Progress Towards Goals: Progressing   Interventions: CBT, DBT, Solution Focused, Strength-based, Supportive, and Reframing   Therapist Response: Cln led discussion on rest. Cln discussed the need to rewrite social story of rest being earned or last on the list and assert that rest is productive. Group members discussed barriers to allowing themselves rest and the negative self-talk involved.  Cln worked with group to thought challenge and apply self-coaching strategies.  Therapist Response: Pt engaged in discussion and reports struggle with allowing themselves rest.         Session Time: 11:00 -12:00   Participation Level: Active   Behavioral Response: CasualAlertDepressed   Type of Therapy: Group Therapy, Occupational Therapy   Treatment Goals addressed: Coping   Progress Towards Goals: Progressing   Interventions: Supportive, Education   Summary:  Occupational Therapy group led by cln E. Hollan.   Therapist Response: Pt participated       Session Time: 12:00 -1:00   Participation Level: Active   Behavioral Response: CasualAlertDepressed   Type of Therapy: Group therapy   Treatment Goals addressed: Coping   Progress Towards Goals: Progressing   Interventions: CBT; Solution focused; Supportive; Reframing   Summary: 12:00 - 12:50: Cln continued discussion on rest and introduced the nine types of rest: time away, permission to not be helpful, something unproductive, connection to art and nature, solitude to recharge, break from responsibility, stillness to decompress, safe space, alone time at home. Group shared ways in which they can utilize each type of rest and which ones are most problematic for  them.  12:50 -1:00 Clinician led check-out. Clinician assessed for immediate needs, medication compliance and efficacy, and safety concerns.    Therapist Response: 12:00 - 12:50: Pt engaged in discussion. 12:50 - 1:00 pm: At check-out, patient reports no immediate concerns. Patient demonstrates progress as evidenced by improved eating habits. Patient denies SI/HI/self-harm thoughts at the end of group.    Suicidal/Homicidal: Nowithout intent/plan  Plan: Pt will continue in PHP while working to decrease depression and anxiety symptoms, increase ADLs, and increase ability to manage symptoms in a healthy manner.   Collaboration of Care: Medication Management AEB V Doda  Patient/Guardian was advised Release of Information must be obtained prior to any record release in order to collaborate their care with an outside provider. Patient/Guardian was advised if they have not already done so to contact the registration department to sign all necessary forms in order for Jennifer Hogan to release information regarding their care.   Consent: Patient/Guardian gives verbal consent for treatment and assignment of benefits for services provided during this visit. Patient/Guardian expressed understanding and agreed to proceed.   Diagnosis: Severe episode of recurrent major depressive disorder, without psychotic features ([place]) [F33.2]    1. Severe episode of recurrent major depressive disorder, without psychotic features (Jennifer Hogan)   2. GAD (generalized anxiety disorder)      Lorin Glass, LCSW

## 2021-10-22 NOTE — Psych (Signed)
Virtual Visit via Video Note  I connected with Jennifer Hogan on 09/08/21 at  9:05 AM EDT by a video enabled telemedicine application and verified that I am speaking with the correct person using two identifiers.  Location: Patient: patient home Provider: clinical home office   I discussed the limitations of evaluation and management by telemedicine and the availability of in person appointments. The patient expressed understanding and agreed to proceed.   I discussed the assessment and treatment plan with the patient. The patient was provided an opportunity to ask questions and all were answered. The patient agreed with the plan and demonstrated an understanding of the instructions.   The patient was advised to call back or seek an in-person evaluation if the symptoms worsen or if the condition fails to improve as anticipated.  Pt was provided 240 minutes of non-face-to-face time during this encounter.   Lorin Glass, LCSW   [facility] BH PHP THERAPIST PROGRESS NOTE  Jennifer Hogan [id number]  Session Time: 9:00 - 10:00  Participation Level: Active  Behavioral Response: CasualAlertDepressed  Type of Therapy: Group Therapy  Treatment Goals addressed: Coping  Progress Towards Goals: Progressing  Interventions: CBT, DBT, Supportive, and Reframing  Summary: Clinician led check-in regarding current stressors and situation, and review of patient completed daily inventory. Clinician utilized active listening and empathetic response and validated patient emotions. Clinician facilitated processing group on pertinent issues.?    Summary: Jennifer Hogan is a 51 y.o. female who presents with depression and anxiety symptoms. Patient arrived within time allowed. Patient rates her mood at a 5 on a scale of 1-10 with 10 being best. Pt states feeling "grumpy." Pt reports passive SI and states managing them with thought challenging. Patient able to process. Patient engaged in discussion.          Session Time: 10:00 am - 11:00 am   Participation Level: Active   Behavioral Response: CasualAlertDepressed   Type of Therapy: Group Therapy   Treatment Goals addressed: Coping   Progress Towards Goals: Progressing   Interventions: CBT, DBT, Solution Focused, Strength-based, Supportive, and Reframing   Therapist Response: Cln led discussion on saying "no." Group members shared struggles they have with saying no and how it affects them. Cln utilized boundaries, communication, and self-esteem tenets to inform discussion.    Therapist Response:  Pt engaged in discussion and reports increased understanding of how to say "no."          Session Time: 11:00 -12:00   Participation Level: Active   Behavioral Response: CasualAlertDepressed   Type of Therapy: Group Therapy, Occupational Therapy   Treatment Goals addressed: Coping   Progress Towards Goals: Progressing   Interventions: Supportive, Education   Summary:  Occupational Therapy group led by cln E. Hollan.   Therapist Response: Pt participated       Session Time: 12:00 -1:00   Participation Level: Active   Behavioral Response: CasualAlertDepressed   Type of Therapy: Group therapy   Treatment Goals addressed: Coping   Progress Towards Goals: Progressing   Interventions: CBT; Solution focused; Supportive; Reframing   Summary: 12:00 - 12:50: Cln continued topic of healthy relationships. Cln discussed the way in which self-esteem can affect what we accept in relationships. Cln emphasized focusing on actions rather than words with those we are in relationship with. Group members shared ways in which their self-esteem has affected relationship dynamics.  12:50 -1:00 Clinician led check-out. Clinician assessed for immediate needs, medication compliance and efficacy, and safety concerns.  Therapist Response: 12:00 - 12:50: Pt engaged in discussion and is able to process and gain insight.  12:50 - 1:00  pm: At check-out, patient reports no immediate concerns. Patient demonstrates progress as evidenced by increased sleep. Patient denies SI/HI/self-harm thoughts at the end of group.    Suicidal/Homicidal: Nowithout intent/plan  Plan: Pt will continue in PHP while working to decrease depression and anxiety symptoms, increase ADLs, and increase ability to manage symptoms in a healthy manner.   Collaboration of Care: Medication Management AEB V Doda  Patient/Guardian was advised Release of Information must be obtained prior to any record release in order to collaborate their care with an outside provider. Patient/Guardian was advised if they have not already done so to contact the registration department to sign all necessary forms in order for [name] to release information regarding their care.   Consent: Patient/Guardian gives verbal consent for treatment and assignment of benefits for services provided during this visit. Patient/Guardian expressed understanding and agreed to proceed.   Diagnosis: Severe episode of recurrent major depressive disorder, without psychotic features ([place]) [F33.2]    1. Severe episode of recurrent major depressive disorder, without psychotic features ([place])   2. Moderate episode of recurrent major depressive disorder ([place])   3. GAD (generalized anxiety disorder)      Lorin Glass, LCSW

## 2021-10-22 NOTE — Psych (Signed)
Virtual Visit via Video Note  I connected with Jennifer Hogan on 08/31/21 at  9:05 AM EDT by a video enabled telemedicine application and verified that I am speaking with the correct person using two identifiers.  Location: Patient: patient home Provider: clinical home office   I discussed the limitations of evaluation and management by telemedicine and the availability of in person appointments. The patient expressed understanding and agreed to proceed.   I discussed the assessment and treatment plan with the patient. The patient was provided an opportunity to ask questions and all were answered. The patient agreed with the plan and demonstrated an understanding of the instructions.   The patient was advised to call back or seek an in-person evaluation if the symptoms worsen or if the condition fails to improve as anticipated.  Pt was provided 240 minutes of non-face-to-face time during this encounter.   Lorin Glass, LCSW   [facility] BH PHP THERAPIST PROGRESS NOTE  Jennifer Hogan [id number]  Session Time: 9:00 - 10:00  Participation Level: Active  Behavioral Response: CasualAlertDepressed  Type of Therapy: Group Therapy  Treatment Goals addressed: Coping  Progress Towards Goals: Progressing  Interventions: CBT, DBT, Supportive, and Reframing  Summary: Clinician led check-in regarding current stressors and situation, and review of patient completed daily inventory. Clinician utilized active listening and empathetic response and validated patient emotions. Clinician facilitated processing group on pertinent issues.?    Summary: Jennifer Hogan is a 51 y.o. female who presents with depression and anxiety symptoms. Patient arrived within time allowed. Patient rates her mood at a 5 on a scale of 1-10 with 10 being best. Pt states feeling "irritable because of all this pain."  Patient able to process. Patient engaged in discussion.         Session Time: 10:00 am - 11:00  am   Participation Level: Active   Behavioral Response: CasualAlertDepressed   Type of Therapy: Group Therapy   Treatment Goals addressed: Coping   Progress Towards Goals: Progressing   Interventions: CBT, DBT, Solution Focused, Strength-based, Supportive, and Reframing   Therapist Response: Cln led processing group for pt's current struggles. Group members shared stressors and provided support and feedback. Cln brought in topics of boundaries, healthy relationships, and unhealthy thought processes to inform discussion.    Therapist Response: Pt able to process and provide support to group.        Session Time: 11:00 -12:00   Participation Level: Active   Behavioral Response: CasualAlertDepressed   Type of Therapy: Group Therapy, Spiritual Care   Treatment Goals addressed: Coping   Progress Towards Goals: Progressing   Interventions: Supportive, Education   Summary:  Jennifer Hogan, Jennifer Hogan, led group.   Therapist Response: Pt participated         Session Time: 12:00 -1:00   Participation Level: Active   Behavioral Response: CasualAlertDepressed   Type of Therapy: Group Therapy   Treatment Goals addressed: Coping   Progress Towards Goals: Progressing   Interventions: Supportive, Education   Summary:  Occupational Therapy group led by cln Jennifer Hogan. 12:50 -1:00 Clinician led check-out. Clinician assessed for immediate needs, medication compliance and efficacy, and safety concerns   Therapist Response: 12:00 - 12:50: Pt participated 12:50 - 1:00 pm: At check-out, patient reports no immediate concerns. Patient demonstrates progress as evidenced by challenging negative thinking Patient denies SI/HI/self-harm thoughts at the end of group.    Suicidal/Homicidal: Nowithout intent/plan  Plan: Pt will continue in PHP while working to decrease depression and anxiety  symptoms, increase ADLs, and increase ability to manage symptoms in a healthy manner.    Collaboration of Care: Medication Management AEB V Doda  Patient/Guardian was advised Release of Information must be obtained prior to any record release in order to collaborate their care with an outside provider. Patient/Guardian was advised if they have not already done so to contact the registration department to sign all necessary forms in order for [name] to release information regarding their care.   Consent: Patient/Guardian gives verbal consent for treatment and assignment of benefits for services provided during this visit. Patient/Guardian expressed understanding and agreed to proceed.   Diagnosis: Severe episode of recurrent major depressive disorder, without psychotic features ([place]) [F33.2]    1. Severe episode of recurrent major depressive disorder, without psychotic features ([place])   2. GAD (generalized anxiety disorder)      Lorin Glass, LCSW

## 2021-10-22 NOTE — Psych (Signed)
Virtual Visit via Video Note  I connected with Kayren Eaves Klayman on 09/01/21 at  9:05 AM EDT by a video enabled telemedicine application and verified that I am speaking with the correct person using two identifiers.  Location: Patient: patient home Provider: clinical home office   I discussed the limitations of evaluation and management by telemedicine and the availability of in person appointments. The patient expressed understanding and agreed to proceed.   I discussed the assessment and treatment plan with the patient. The patient was provided an opportunity to ask questions and all were answered. The patient agreed with the plan and demonstrated an understanding of the instructions.   The patient was advised to call back or seek an in-person evaluation if the symptoms worsen or if the condition fails to improve as anticipated.  Pt was provided 240 minutes of non-face-to-face time during this encounter.   Lorin Glass, LCSW   [facility] BH PHP THERAPIST PROGRESS NOTE  SARHA BARTELT [id number]  Session Time: 9:00 - 10:00  Participation Level: Active  Behavioral Response: CasualAlertDepressed  Type of Therapy: Group Therapy  Treatment Goals addressed: Coping  Progress Towards Goals: Progressing  Interventions: CBT, DBT, Supportive, and Reframing  Summary: Clinician led check-in regarding current stressors and situation, and review of patient completed daily inventory. Clinician utilized active listening and empathetic response and validated patient emotions. Clinician facilitated processing group on pertinent issues.?    Summary: TELETHA PETREA is a 51 y.o. female who presents with depression and anxiety symptoms. Patient arrived within time allowed. Patient rates her mood at a 7 on a scale of 1-10 with 10 being best. Pt states feeling "alert."  Patient able to process. Patient engaged in discussion.         Session Time: 10:00 am - 11:00 am   Participation Level:  Active   Behavioral Response: CasualAlertDepressed   Type of Therapy: Group Therapy   Treatment Goals addressed: Coping   Progress Towards Goals: Progressing   Interventions: CBT, DBT, Solution Focused, Strength-based, Supportive, and Reframing   Therapist Response: Cln led processing group for pt's current struggles. Group members shared stressors and provided support and feedback. Cln brought in topics of boundaries, healthy relationships, and unhealthy thought processes to inform discussion.   Therapist Response: Pt able to process and provide support to group.          Session Time: 11:00 -12:00   Participation Level: Active   Behavioral Response: CasualAlertDepressed   Type of Therapy: Group Therapy, Occupational Therapy   Treatment Goals addressed: Coping   Progress Towards Goals: Progressing   Interventions: Supportive, Education   Summary:  Occupational Therapy group led by cln E. Hollan.   Therapist Response: Pt participated       Session Time: 12:00 -1:00   Participation Level: Active   Behavioral Response: CasualAlertDepressed   Type of Therapy: Group therapy   Treatment Goals addressed: Coping   Progress Towards Goals: Progressing   Interventions: CBT; Solution focused; Supportive; Reframing   Summary: 12:00 - 12:50: Cln led discussion on how to fill unplanned time. Group members shared ways in which downtime negatively impacts mental health and often causes them to dwell on negative thinking. Group brainstormed ways to manage down time and problem solved how to handle barriers.  12:50 -1:00 Clinician led check-out. Clinician assessed for immediate needs, medication compliance and efficacy, and safety concerns.   Therapist Response: 12:00 - 12:50: Pt engaged in discussion. 12:50 - 1:00 pm: At check-out, patient reports no  immediate concerns. Patient demonstrates progress as evidenced by engaging in hobbies. Patient denies SI/HI/self-harm thoughts  at the end of group.    Suicidal/Homicidal: Nowithout intent/plan  Plan: Pt will continue in PHP while working to decrease depression and anxiety symptoms, increase ADLs, and increase ability to manage symptoms in a healthy manner.   Collaboration of Care: Medication Management AEB V Doda  Patient/Guardian was advised Release of Information must be obtained prior to any record release in order to collaborate their care with an outside provider. Patient/Guardian was advised if they have not already done so to contact the registration department to sign all necessary forms in order for [name] to release information regarding their care.   Consent: Patient/Guardian gives verbal consent for treatment and assignment of benefits for services provided during this visit. Patient/Guardian expressed understanding and agreed to proceed.   Diagnosis: Severe episode of recurrent major depressive disorder, without psychotic features ([place]) [F33.2]    1. Severe episode of recurrent major depressive disorder, without psychotic features ([place])   2. Moderate episode of recurrent major depressive disorder ([place])   3. GAD (generalized anxiety disorder)      Lorin Glass, LCSW

## 2021-10-22 NOTE — Psych (Signed)
Virtual Visit via Video Note  I connected with Jennifer Hogan on 09/05/21 at  9:05 AM EDT by a video enabled telemedicine application and verified that I am speaking with the correct person using two identifiers.  Location: Patient: patient home Provider: clinical home office   I discussed the limitations of evaluation and management by telemedicine and the availability of in person appointments. The patient expressed understanding and agreed to proceed.   I discussed the assessment and treatment plan with the patient. The patient was provided an opportunity to ask questions and all were answered. The patient agreed with the plan and demonstrated an understanding of the instructions.   The patient was advised to call back or seek an in-person evaluation if the symptoms worsen or if the condition fails to improve as anticipated.  Pt was provided 240 minutes of non-face-to-face time during this encounter.   Jennifer Glass, LCSW   [facility] BH PHP THERAPIST PROGRESS NOTE  Jennifer Hogan [id number]  Session Time: 9:00 - 10:00  Participation Level: Active  Behavioral Response: CasualAlertDepressed  Type of Therapy: Group Therapy  Treatment Goals addressed: Coping  Progress Towards Goals: Progressing  Interventions: CBT, DBT, Supportive, and Reframing  Summary: Clinician led check-in regarding current stressors and situation, and review of patient completed daily inventory. Clinician utilized active listening and empathetic response and validated patient emotions. Clinician facilitated processing group on pertinent issues.?    Summary: Jennifer Hogan is a 51 y.o. female who presents with depression and anxiety symptoms. Patient arrived within time allowed. Patient rates her mood at a 6 on a scale of 1-10 with 10 being best. Pt states feeling "mentally ok."  Patient able to process. Patient engaged in discussion.         Session Time: 10:00 am - 11:00 am   Participation  Level: Active   Behavioral Response: CasualAlertDepressed   Type of Therapy: Group Therapy   Treatment Goals addressed: Coping   Progress Towards Goals: Progressing   Interventions: CBT, DBT, Solution Focused, Strength-based, Supportive, and Reframing   Therapist Response: Cln led processing group for pt's current struggles. Group members shared stressors and provided support and feedback. Cln brought in topics of boundaries, healthy relationships, and unhealthy thought processes to inform discussion.   Therapist Response: Pt able to process and provide support to group.          Session Time: 11:00 -12:00   Participation Level: Active   Behavioral Response: CasualAlertDepressed   Type of Therapy: Group Therapy   Treatment Goals addressed: Coping   Progress Towards Goals: Progressing   Interventions:  CBT, DBT, Solution Focused, Strength-based, Supportive, and Reframing   Summary:  Cln introduced topic of DBT distress tolerance skills. Cln provided context for distress tolerance skills and how to practice them. Cln introduced the ACCEPTS distraction skills and group discusses ways to utilize "A-C-C.   Therapist Response: Pt participated       Session Time: 12:00 -1:00   Participation Level: Active   Behavioral Response: CasualAlertDepressed   Type of Therapy: Group therapy   Treatment Goals addressed: Coping   Progress Towards Goals: Progressing   Interventions: CBT; Solution focused; Supportive; Reframing   Summary: 12:00 - 12:50:Cln continued topic of DBT distress tolerance skills and the ACCEPTS distraction skill. Group reviewed E-P-S skills and discussed how they can practice them in their every day life.  12:50 -1:00 Clinician led check-out. Clinician assessed for immediate needs, medication compliance and efficacy, and safety concerns.   Therapist Response:  12:00 - 12:50: Pt engaged in discussion. 12:50 - 1:00 pm: At check-out, patient reports no  immediate concerns. Patient demonstrates progress as evidenced by reaching out to supports.  Patient denies SI/HI/self-harm thoughts at the end of group.    Suicidal/Homicidal: Nowithout intent/plan  Plan: Pt will continue in PHP while working to decrease depression and anxiety symptoms, increase ADLs, and increase ability to manage symptoms in a healthy manner.   Collaboration of Care: Medication Management AEB V Doda  Patient/Guardian was advised Release of Information must be obtained prior to any record release in order to collaborate their care with an outside provider. Patient/Guardian was advised if they have not already done so to contact the registration department to sign all necessary forms in order for [name] to release information regarding their care.   Consent: Patient/Guardian gives verbal consent for treatment and assignment of benefits for services provided during this visit. Patient/Guardian expressed understanding and agreed to proceed.   Diagnosis: Severe episode of recurrent major depressive disorder, without psychotic features ([place]) [F33.2]    1. Severe episode of recurrent major depressive disorder, without psychotic features ([place])   2. GAD (generalized anxiety disorder)      Jennifer Glass, LCSW

## 2021-10-22 NOTE — Psych (Signed)
Virtual Visit via Video Note  I connected with Jennifer Hogan on 09/09/21 at  9:05 AM EDT by a video enabled telemedicine application and verified that I am speaking with the correct person using two identifiers.  Location: Patient: patient home Provider: clinical home office   I discussed the limitations of evaluation and management by telemedicine and the availability of in person appointments. The patient expressed understanding and agreed to proceed.   I discussed the assessment and treatment plan with the patient. The patient was provided an opportunity to ask questions and all were answered. The patient agreed with the plan and demonstrated an understanding of the instructions.   The patient was advised to call back or seek an in-person evaluation if the symptoms worsen or if the condition fails to improve as anticipated.  Pt was provided 240 minutes of non-face-to-face time during this encounter.   Lorin Glass, LCSW   [facility] BH PHP THERAPIST PROGRESS NOTE  Jennifer Hogan [id number]  Session Time: 9:00 - 10:00  Participation Level: Active  Behavioral Response: CasualAlertDepressed  Type of Therapy: Group Therapy  Treatment Goals addressed: Coping  Progress Towards Goals: Progressing  Interventions: CBT, DBT, Supportive, and Reframing  Summary: Clinician led check-in regarding current stressors and situation, and review of patient completed daily inventory. Clinician utilized active listening and empathetic response and validated patient emotions. Clinician facilitated processing group on pertinent issues.?    Summary: Jennifer Hogan is a 51 y.o. female who presents with depression and anxiety symptoms. Patient arrived within time allowed. Patient rates her mood at a 7 on a scale of 1-10 with 10 being best. Pt states feeling "good." Patient able to process. Patient engaged in discussion.         Session Time: 10:00 am - 11:00 am   Participation Level:  Active   Behavioral Response: CasualAlertDepressed   Type of Therapy: Group Therapy   Treatment Goals addressed: Coping   Progress Towards Goals: Progressing   Interventions: CBT, DBT, Solution Focused, Strength-based, Supportive, and Reframing   Therapist Response: Cln led discussion on accountability and the balance between taking responsibility and not beating ourselves up. Group members shared current consequences they are dealing with and how they are processing them. Cln encouraged pt's to utilize the Best Friend Test to make it easier to offer themselves kindness.     Therapist Response:  Pt engaged in discussion and is able to process.          Session Time: 11:00 -12:00   Participation Level: Active   Behavioral Response: CasualAlertDepressed   Type of Therapy: Group Therapy, Occupational Therapy   Treatment Goals addressed: Coping   Progress Towards Goals: Progressing   Interventions: Supportive, Education   Summary:  Occupational Therapy group led by cln E. Hollan.   Therapist Response: Pt participated       Session Time: 12:00 -1:00   Participation Level: Active   Behavioral Response: CasualAlertDepressed   Type of Therapy: Group therapy   Treatment Goals addressed: Coping   Progress Towards Goals: Progressing   Interventions: CBT; Solution focused; Supportive; Reframing   Summary: 12:00 - 12:50: Cln led discussion on ways to manage stressors and feelings over the weekend. Group members  brainstormed things to do over the weekend for multiple levels of energy, access, and moods. Cln reviewed crisis services should they be needed and provided pt's with the text crisis line, mobile crisis, national suicide hotline, [facility] 24/7 line, and information on [facility].  12:50 - 1:00 Clinician assessed for immediate needs, medication compliance and efficacy, and safety concerns.   Therapist Response: 12:00 - 12:50: Pt engaged in discussion and is able to  identify 3 ideas of what to do over the weekend to keep their mind engaged.  12:50 - 1:00 pm: At check-out, patient reports no immediate concerns. Patient demonstrates progress as evidenced by focus on self care. Patient denies SI/HI/self-harm thoughts at the end of group.    Suicidal/Homicidal: Nowithout intent/plan  Plan: Pt will continue in PHP while working to decrease depression and anxiety symptoms, increase ADLs, and increase ability to manage symptoms in a healthy manner.   Collaboration of Care: Medication Management AEB V Doda  Patient/Guardian was advised Release of Information must be obtained prior to any record release in order to collaborate their care with an outside provider. Patient/Guardian was advised if they have not already done so to contact the registration department to sign all necessary forms in order for [name] to release information regarding their care.   Consent: Patient/Guardian gives verbal consent for treatment and assignment of benefits for services provided during this visit. Patient/Guardian expressed understanding and agreed to proceed.   Diagnosis: Severe episode of recurrent major depressive disorder, without psychotic features ([place]) [F33.2]    1. Severe episode of recurrent major depressive disorder, without psychotic features ([place])   2. GAD (generalized anxiety disorder)      Lorin Glass, LCSW

## 2021-10-22 NOTE — Psych (Signed)
Virtual Visit via Video Note  I connected with Kayren Eaves Tecson on 08/25/21 at  9:05 AM EDT by a video enabled telemedicine application and verified that I am speaking with the correct person using two identifiers.  Location: Patient: patient home Provider: clinical home office   I discussed the limitations of evaluation and management by telemedicine and the availability of in person appointments. The patient expressed understanding and agreed to proceed.   I discussed the assessment and treatment plan with the patient. The patient was provided an opportunity to ask questions and all were answered. The patient agreed with the plan and demonstrated an understanding of the instructions.   The patient was advised to call back or seek an in-person evaluation if the symptoms worsen or if the condition fails to improve as anticipated.  Pt was provided 240 minutes of non-face-to-face time during this encounter.   Lorin Glass, LCSW   [facility] BH PHP THERAPIST PROGRESS NOTE  WAVERLY TARQUINIO [id number]  Session Time: 9:00 - 10:00  Participation Level: Active  Behavioral Response: CasualAlertDepressed  Type of Therapy: Group Therapy  Treatment Goals addressed: Coping  Progress Towards Goals: Initial  Interventions: CBT, DBT, Supportive, and Reframing  Summary: Clinician led check-in regarding current stressors and situation, and review of patient completed daily inventory. Clinician utilized active listening and empathetic response and validated patient emotions. Clinician facilitated processing group on pertinent issues.?    Summary: GIRTHA KILGORE is a 51 y.o. female who presents with depression and anxiety symptoms. Patient arrived within time allowed. Patient rates her mood at a 5 on a scale of 1-10 with 10 being best. Pt states feeling "anxious."  Patient able to process. Patient engaged in discussion.         Session Time: 10:00 am - 11:00 am   Participation Level:  Active   Behavioral Response: CasualAlertDepressed   Type of Therapy: Group Therapy   Treatment Goals addressed: Coping   Progress Towards Goals: Progressing   Interventions: CBT, DBT, Solution Focused, Strength-based, Supportive, and Reframing   Therapist Response: Cln introduced wellness topic of sleep hygiene. Cln discussed ways in which poor sleep affects our mood and overall wellness. Cln provided education on sleep hygiene techniques and principles. Group discussed their current sleep issues and how they can apply sleep hygiene skills to improve their quality of sleep.    Therapist Response: Pt engaged in discussion and identifies which sleep hygiene skill they will apply first.           Session Time: 11:00 -12:00   Participation Level: Active   Behavioral Response: CasualAlertDepressed   Type of Therapy: Group Therapy, Occupational Therapy   Treatment Goals addressed: Coping   Progress Towards Goals: Progressing   Interventions: Supportive, Education   Summary:  Occupational Therapy group led by cln E. Hollan.   Therapist Response: Pt participated       Session Time: 12:00 -1:00   Participation Level: Active   Behavioral Response: CasualAlertDepressed   Type of Therapy: Group therapy   Treatment Goals addressed: Coping   Progress Towards Goals: Progressing   Interventions: CBT; Solution focused; Supportive; Reframing   Summary: 12:00 - 12:50: Cln led discussion on reverting to old behaviors. Group members discussed ways in which they feel they have reverted currently or in the past. Group members report fear of reverting to old behaviors and they struggle to manage that fear. Cln informed discussion with CBT thought challenging and DBT distress tolerance skills. 12:50 -1:00 Clinician led check-out.  Clinician assessed for immediate needs, medication compliance and efficacy, and safety concerns.   Therapist Response: 12:00 - 12:50: Pt engaged in  discussion. 12:50 - 1:00 pm: At check-out, patient reports no immediate concerns. Patient demonstrates progress as evidenced by participating in first group session Patient denies SI/HI/self-harm thoughts at the end of group.    Suicidal/Homicidal: Nowithout intent/plan  Plan: Pt will continue in PHP while working to decrease depression and anxiety symptoms, increase ADLs, and increase ability to manage symptoms in a healthy manner.   Collaboration of Care: Medication Management AEB V Doda  Patient/Guardian was advised Release of Information must be obtained prior to any record release in order to collaborate their care with an outside provider. Patient/Guardian was advised if they have not already done so to contact the registration department to sign all necessary forms in order for [name] to release information regarding their care.   Consent: Patient/Guardian gives verbal consent for treatment and assignment of benefits for services provided during this visit. Patient/Guardian expressed understanding and agreed to proceed.   Diagnosis: Severe episode of recurrent major depressive disorder, without psychotic features ([place]) [F33.2]    1. Severe episode of recurrent major depressive disorder, without psychotic features ([place])   2. Moderate episode of recurrent major depressive disorder ([place])   3. GAD (generalized anxiety disorder)      Lorin Glass, LCSW

## 2021-10-23 NOTE — Psych (Signed)
Virtual Visit via Video Note  I connected with Jennifer Hogan on 09/12/21 at  9:05 AM EDT by a video enabled telemedicine application and verified that I am speaking with the correct person using two identifiers.  Location: Patient: patient home Provider: clinical home office   I discussed the limitations of evaluation and management by telemedicine and the availability of in person appointments. The patient expressed understanding and agreed to proceed.   I discussed the assessment and treatment plan with the patient. The patient was provided an opportunity to ask questions and all were answered. The patient agreed with the plan and demonstrated an understanding of the instructions.   The patient was advised to call back or seek an in-person evaluation if the symptoms worsen or if the condition fails to improve as anticipated.  Pt was provided 240 minutes of non-face-to-face time during this encounter.   Lorin Glass, LCSW   [facility] BH PHP THERAPIST PROGRESS NOTE  Jennifer Hogan [id number]  Session Time: 9:00 - 10:00  Participation Level: Active  Behavioral Response: CasualAlertDepressed  Type of Therapy: Group Therapy  Treatment Goals addressed: Coping  Progress Towards Goals: Progressing  Interventions: CBT, DBT, Supportive, and Reframing  Summary: Clinician led check-in regarding current stressors and situation, and review of patient completed daily inventory. Clinician utilized active listening and empathetic response and validated patient emotions. Clinician facilitated processing group on pertinent issues.?    Summary: Jennifer Hogan is a 51 y.o. female who presents with depression and anxiety symptoms. Patient arrived within time allowed. Patient rates her mood at a 7.5 on a scale of 1-10 with 10 being best. Pt states feeling "pretty good." Pt reports physically feeling like "hot garbage." Pt reports passive SI over the weekend and denying plan or intent.   Patient able to process. Patient engaged in discussion.         Session Time: 10:00 am - 11:00 am   Participation Level: Active   Behavioral Response: CasualAlertDepressed   Type of Therapy: Group Therapy   Treatment Goals addressed: Coping   Progress Towards Goals: Progressing   Interventions: CBT, DBT, Solution Focused, Strength-based, Supportive, and Reframing   Therapist Response: Cln led discussion on "spiraling" or when our thoughts compound on one another to descend our feelings into worse and worse situations. Group members discussed the ways in which spiraling is difficult for them and when they are especially vulnerable. Cln offered DBT distraction skills as a way to halt the spiral once you recognize it.    Therapist Response:  Pt engaged in discussion and reports spiraling is a major issue for them. Pt is able to increase awareness of spiraling throughout the discussion.          Session Time: 11:00 -12:00   Participation Level: Active   Behavioral Response: CasualAlertDepressed   Type of Therapy: Group Therapy, Occupational Therapy   Treatment Goals addressed: Coping   Progress Towards Goals: Progressing   Interventions: Supportive, Education   Summary:  Occupational Therapy group led by cln E. Hollan.   Therapist Response: Pt participated       Session Time: 12:00 -1:00   Participation Level: Active   Behavioral Response: CasualAlertDepressed   Type of Therapy: Group therapy   Treatment Goals addressed: Coping   Progress Towards Goals: Progressing   Interventions: CBT; Solution focused; Supportive; Reframing   Summary: 12:00 - 12:50: Cln introduced DBT interpersonal effectiveness skill for self-respect, FAST. Cln led discussion on barriers to utilizing this skill, how it  could be helpful, and situations in which they could have utilized it.  12:50 - 1:00 Clinician assessed for immediate needs, medication compliance and efficacy, and safety  concerns.   Therapist Response: 12:00 - 12:50: Pt engaged in discussion and reports willingness to utilize FAST.  12:50 - 1:00 pm: At check-out, patient reports no immediate concerns. Patient demonstrates progress as evidenced by reaching out. Patient denies SI/HI/self-harm thoughts at the end of group.    Suicidal/Homicidal: Nowithout intent/plan  Plan: Pt will continue in PHP while working to decrease depression and anxiety symptoms, increase ADLs, and increase ability to manage symptoms in a healthy manner.   Collaboration of Care: Medication Management AEB V Doda  Patient/Guardian was advised Release of Information must be obtained prior to any record release in order to collaborate their care with an outside provider. Patient/Guardian was advised if they have not already done so to contact the registration department to sign all necessary forms in order for [name] to release information regarding their care.   Consent: Patient/Guardian gives verbal consent for treatment and assignment of benefits for services provided during this visit. Patient/Guardian expressed understanding and agreed to proceed.   Diagnosis: Severe episode of recurrent major depressive disorder, without psychotic features ([place]) [F33.2]    1. Severe episode of recurrent major depressive disorder, without psychotic features ([place])   2. GAD (generalized anxiety disorder)      Lorin Glass, LCSW

## 2021-10-23 NOTE — Psych (Signed)
Virtual Visit via Video Note  I connected with Jennifer Hogan on 09/13/21 at  9:05 AM EDT by a video enabled telemedicine application and verified that I am speaking with the correct person using two identifiers.  Location: Patient: patient home Provider: clinical home office   I discussed the limitations of evaluation and management by telemedicine and the availability of in person appointments. The patient expressed understanding and agreed to proceed.   I discussed the assessment and treatment plan with the patient. The patient was provided an opportunity to ask questions and all were answered. The patient agreed with the plan and demonstrated an understanding of the instructions.   The patient was advised to call back or seek an in-person evaluation if the symptoms worsen or if the condition fails to improve as anticipated.  Pt was provided 240 minutes of non-face-to-face time during this encounter.   Lorin Glass, LCSW   [facility] BH PHP THERAPIST PROGRESS NOTE  Jennifer Hogan [id number]  Session Time: 9:00 - 10:00  Participation Level: Active  Behavioral Response: CasualAlertDepressed  Type of Therapy: Group Therapy  Treatment Goals addressed: Coping  Progress Towards Goals: Progressing  Interventions: CBT, DBT, Supportive, and Reframing  Summary: Clinician led check-in regarding current stressors and situation, and review of patient completed daily inventory. Clinician utilized active listening and empathetic response and validated patient emotions. Clinician facilitated processing group on pertinent issues.?    Summary: Jennifer Hogan is a 51 y.o. female who presents with depression and anxiety symptoms. Patient arrived within time allowed. Patient rates her mood at a 5 on a scale of 1-10 with 10 being best. Pt states feeling "ok, but pain is high today." Patient able to process. Patient engaged in discussion.         Session Time: 10:00 am - 11:00 am    Participation Level: Active   Behavioral Response: CasualAlertDepressed   Type of Therapy: Group Therapy   Treatment Goals addressed: Coping   Progress Towards Goals: Progressing   Interventions: CBT, DBT, Solution Focused, Strength-based, Supportive, and Reframing   Therapist Response: Cln led discussion on impulsivity. Group discussed struggles with impulsivity. Cln highlighted theme of immediacy. Group built insight around the way immediacy interacts with impulsivity. Cln encouraged pt's to consider mantras and grounding statements to remind themselves there is time to think/feel/act.    Therapist Response: Pt engaged in discussion and is able to make connections and gain insight.          Session Time: 11:00 -12:00   Participation Level: Active   Behavioral Response: CasualAlertDepressed   Type of Therapy: Group Therapy, Occupational Therapy   Treatment Goals addressed: Coping   Progress Towards Goals: Progressing   Interventions: Supportive, Education   Summary:  Occupational Therapy group led by cln E. Hollan.   Therapist Response: Pt participated       Session Time: 12:00 -1:00   Participation Level: Active   Behavioral Response: CasualAlertDepressed   Type of Therapy: Group therapy   Treatment Goals addressed: Coping   Progress Towards Goals: Progressing   Interventions: CBT; Solution focused; Supportive; Reframing   Summary: 12:00 - 12:50: Cln led discussion on positive self-esteem. Group shared struggles they experience with self-esteem. Group highlights difficulty in accepting compliments. Cln encouraged pt's to use checking the facts as a way to challenge the negative thinking which accompanies receiving compliments. Cln suggested pt's consider making a compliment file in which they document positive things people say to/about them to review when they are  feeling poorly about themselves. 12:50 - 1:00 Clinician assessed for immediate needs,  medication compliance and efficacy, and safety concerns.   Therapist Response: 12:00 - 12:50: Pt engaged in conversation and shares difficulty in accepting compliments. Pt is able to process and reports being open to starting a compliment file.  12:50 - 1:00 pm: At check-out, patient reports no immediate concerns. Patient demonstrates progress as evidenced by increased sense of worth. Patient denies SI/HI/self-harm thoughts at the end of group.    Suicidal/Homicidal: Nowithout intent/plan  Plan: Pt will continue in PHP while working to decrease depression and anxiety symptoms, increase ADLs, and increase ability to manage symptoms in a healthy manner.   Collaboration of Care: Medication Management AEB V Doda  Patient/Guardian was advised Release of Information must be obtained prior to any record release in order to collaborate their care with an outside provider. Patient/Guardian was advised if they have not already done so to contact the registration department to sign all necessary forms in order for [name] to release information regarding their care.   Consent: Patient/Guardian gives verbal consent for treatment and assignment of benefits for services provided during this visit. Patient/Guardian expressed understanding and agreed to proceed.   Diagnosis: Severe episode of recurrent major depressive disorder, without psychotic features ([place]) [F33.2]    1. Severe episode of recurrent major depressive disorder, without psychotic features ([place])   2. GAD (generalized anxiety disorder)      Lorin Glass, LCSW

## 2021-10-24 ENCOUNTER — Other Ambulatory Visit: Payer: Self-pay | Admitting: Nurse Practitioner

## 2021-10-24 ENCOUNTER — Other Ambulatory Visit: Payer: Self-pay

## 2021-10-24 DIAGNOSIS — J9801 Acute bronchospasm: Secondary | ICD-10-CM

## 2021-10-24 MED ORDER — ALBUTEROL SULFATE HFA 108 (90 BASE) MCG/ACT IN AERS
2.0000 | INHALATION_SPRAY | Freq: Four times a day (QID) | RESPIRATORY_TRACT | 0 refills | Status: DC | PRN
  Filled 2021-10-24: qty 8.5, 25d supply, fill #0

## 2021-10-24 NOTE — Telephone Encounter (Signed)
Requested Prescriptions  Pending Prescriptions Disp Refills  . albuterol (VENTOLIN HFA) 108 (90 Base) MCG/ACT inhaler 8.5 g 0    Sig: INHALE 2 PUFFS INTO THE LUNGS EVERY 6 (SIX) HOURS AS NEEDED FOR WHEEZING OR SHORTNESS OF BREATH.     Pulmonology:  Beta Agonists 2 Passed - 10/22/2021  7:41 AM      Passed - Last BP in normal range    BP Readings from Last 1 Encounters:  09/29/21 99/65         Passed - Last Heart Rate in normal range    Pulse Readings from Last 1 Encounters:  09/29/21 82         Passed - Valid encounter within last 12 months    Recent Outpatient Visits          3 weeks ago Anxiety and depression   [place], Vernia Buff, NP   3 months ago Hospital discharge follow-up   [place], [place] W, NP   7 months ago Type 1 diabetes mellitus with other circulatory complication [facility])   [place] [name], NP   12 months ago Flu-like symptoms   [place], Vernia Buff, NP   1 year ago Chronic fatigue   [place], Vernia Buff, NP      Future Appointments            In 2 months [name], NP [place]

## 2021-10-25 ENCOUNTER — Other Ambulatory Visit: Payer: Self-pay

## 2021-10-25 MED ORDER — ALBUTEROL SULFATE HFA 108 (90 BASE) MCG/ACT IN AERS
2.0000 | INHALATION_SPRAY | Freq: Four times a day (QID) | RESPIRATORY_TRACT | 2 refills | Status: DC | PRN
  Filled 2021-10-25: qty 18, 28d supply, fill #0
  Filled 2021-12-19: qty 18, 28d supply, fill #1
  Filled 2022-08-24 – 2022-08-28 (×2): qty 18, 28d supply, fill #2

## 2021-10-25 NOTE — Telephone Encounter (Signed)
   Notes to clinic:  this was sent to [facility] yesterday, therei s not receipt received date however pharm request as follows:   Pharmacy comment: please send in 18 g inhaler please    Requested Prescriptions  Pending Prescriptions Disp Refills   albuterol (VENTOLIN HFA) 108 (90 Base) MCG/ACT inhaler 8.5 g 0    Sig: INHALE 2 PUFFS INTO THE LUNGS EVERY 6 (SIX) HOURS AS NEEDED FOR WHEEZING OR SHORTNESS OF BREATH.     Pulmonology:  Beta Agonists 2 Passed - 10/24/2021 10:04 AM      Passed - Last BP in normal range    BP Readings from Last 1 Encounters:  09/29/21 99/65         Passed - Last Heart Rate in normal range    Pulse Readings from Last 1 Encounters:  09/29/21 82         Passed - Valid encounter within last 12 months    Recent Outpatient Visits           3 weeks ago Anxiety and depression   [place], Vernia Buff, NP   4 months ago Hospital discharge follow-up   [place], [place] W, NP   7 months ago Type 1 diabetes mellitus with other circulatory complication [facility])   [place] [name], NP   12 months ago Flu-like symptoms   [place], Vernia Buff, NP   1 year ago Chronic fatigue   [place], Vernia Buff, NP       Future Appointments             In 2 months [name], NP [place]

## 2021-10-27 ENCOUNTER — Other Ambulatory Visit: Payer: Self-pay

## 2021-11-03 ENCOUNTER — Other Ambulatory Visit: Payer: Self-pay

## 2021-11-03 ENCOUNTER — Ambulatory Visit (HOSPITAL_COMMUNITY)
Admission: RE | Admit: 2021-11-03 | Discharge: 2021-11-03 | Disposition: A | Discharge status: Home / Self Care | Payer: Medicaid Other | Source: Ambulatory Visit | Attending: Internal Medicine | Admitting: Internal Medicine

## 2021-11-03 VITALS — BP 110/70 | HR 70 | Wt 152.4 lb

## 2021-11-03 DIAGNOSIS — E1136 Type 2 diabetes mellitus with diabetic cataract: Secondary | ICD-10-CM | POA: Diagnosis not present

## 2021-11-03 DIAGNOSIS — Z794 Long term (current) use of insulin: Secondary | ICD-10-CM | POA: Insufficient documentation

## 2021-11-03 DIAGNOSIS — I2510 Atherosclerotic heart disease of native coronary artery without angina pectoris: Secondary | ICD-10-CM | POA: Insufficient documentation

## 2021-11-03 DIAGNOSIS — Z951 Presence of aortocoronary bypass graft: Secondary | ICD-10-CM | POA: Diagnosis not present

## 2021-11-03 DIAGNOSIS — Z7982 Long term (current) use of aspirin: Secondary | ICD-10-CM | POA: Diagnosis not present

## 2021-11-03 DIAGNOSIS — Z79899 Other long term (current) drug therapy: Secondary | ICD-10-CM | POA: Insufficient documentation

## 2021-11-03 DIAGNOSIS — Z7902 Long term (current) use of antithrombotics/antiplatelets: Secondary | ICD-10-CM | POA: Insufficient documentation

## 2021-11-03 DIAGNOSIS — I2582 Chronic total occlusion of coronary artery: Secondary | ICD-10-CM | POA: Diagnosis not present

## 2021-11-03 DIAGNOSIS — F419 Anxiety disorder, unspecified: Secondary | ICD-10-CM | POA: Insufficient documentation

## 2021-11-03 DIAGNOSIS — I252 Old myocardial infarction: Secondary | ICD-10-CM | POA: Diagnosis not present

## 2021-11-03 DIAGNOSIS — E875 Hyperkalemia: Secondary | ICD-10-CM | POA: Diagnosis not present

## 2021-11-03 DIAGNOSIS — E785 Hyperlipidemia, unspecified: Secondary | ICD-10-CM | POA: Diagnosis not present

## 2021-11-03 DIAGNOSIS — E039 Hypothyroidism, unspecified: Secondary | ICD-10-CM | POA: Insufficient documentation

## 2021-11-03 DIAGNOSIS — I5022 Chronic systolic (congestive) heart failure: Secondary | ICD-10-CM | POA: Diagnosis not present

## 2021-11-03 DIAGNOSIS — D649 Anemia, unspecified: Secondary | ICD-10-CM | POA: Diagnosis not present

## 2021-11-03 LAB — COMPREHENSIVE METABOLIC PANEL
ALT: 20 U/L (ref 0–44)
AST: 29 U/L (ref 15–41)
Albumin: 3.5 g/dL (ref 3.5–5.0)
Alkaline Phosphatase: 71 U/L (ref 38–126)
Anion gap: 5 (ref 5–15)
BUN: 9 mg/dL (ref 6–20)
CO2: 24 mmol/L (ref 22–32)
Calcium: 8.9 mg/dL (ref 8.9–10.3)
Chloride: 105 mmol/L (ref 98–111)
Creatinine, Ser: 0.97 mg/dL (ref 0.44–1.00)
GFR, Estimated: >60 mL/min (ref >60)
Glucose, Bld: 387 mg/dL — ABNORMAL HIGH (ref 70–99)
Potassium: 5.9 mmol/L — ABNORMAL HIGH (ref 3.5–5.1)
Sodium: 134 mmol/L — ABNORMAL LOW (ref 135–145)
Total Bilirubin: 0.6 mg/dL (ref 0.3–1.2)
Total Protein: 6.7 g/dL (ref 6.5–8.1)

## 2021-11-03 LAB — BRAIN NATRIURETIC PEPTIDE: B Natriuretic Peptide: 127.9 pg/mL — ABNORMAL HIGH (ref 0.0–100.0)

## 2021-11-03 MED ORDER — LOSARTAN POTASSIUM 25 MG PO TABS
12.5000 mg | ORAL_TABLET | Freq: Every day | ORAL | 3 refills | Status: DC
Start: 2021-11-03 — End: 2022-01-15
  Filled 2021-11-03: qty 45, 90d supply, fill #0

## 2021-11-03 NOTE — Progress Notes (Signed)
ReDS Vest / Clip - 11/03/21 1100       ReDS Vest / Clip   Station Marker A    Ruler Value 25    ReDS Actual Value 27

## 2021-11-03 NOTE — Patient Instructions (Addendum)
Start losartan 12.5 mg daily if potassium <= 4.8  Labs done today, your results will be available in MyChart, we will contact you for abnormal readings.  Repeat blood work in 2 weeks  Your physician has requested that you have an echocardiogram. Echocardiography is a painless test that uses sound waves to create images of your heart. It provides your doctor with information about the size and shape of your heart and how well your heart's chambers and valves are working. This procedure takes approximately one hour. There are no restrictions for this procedure.  Your physician recommends that you schedule a follow-up appointment in: 4-6 months ( February - April 2024)  ** please call the office in December to arrange your follow up appointment **  If you have any questions or concerns before your next appointment please send [name] a message through [place] or call our office at [phone].    TO LEAVE A MESSAGE FOR THE NURSE SELECT OPTION 2, PLEASE LEAVE A MESSAGE INCLUDING: YOUR NAME DATE OF BIRTH CALL BACK NUMBER REASON FOR CALL**this is important as we prioritize the call backs  YOU WILL RECEIVE A CALL BACK THE SAME DAY AS LONG AS YOU CALL BEFORE 4:00 PM  At the [facility], you and your health needs are our priority. As part of our continuing mission to provide you with exceptional heart care, we have created designated Provider Care Teams. These Care Teams include your primary Cardiologist (physician) and Advanced Practice Providers (APPs- Physician Assistants and Nurse Practitioners) who all work together to provide you with the care you need, when you need it.   You may see any of the following providers on your designated Care Team at your next follow up: Dr Glori Bickers Dr Loralie Champagne Dr. Roxana Hires, NP Lyda Jester, [place] [facility], [place] Forestine Na, NP Audry Riles, PharmD   Please be sure to bring in all your  medications bottles to every appointment.

## 2021-11-03 NOTE — Addendum Note (Signed)
Encounter addended by: Jerl Mina, RN on: 11/03/2021 11:56 AM  Actions taken: Pharmacy for encounter modified, Order list changed, Diagnosis association updated, Charge Capture section accepted, Clinical Note Signed

## 2021-11-03 NOTE — Progress Notes (Signed)
Advanced Heart Failure Clinic Note    PCP: [name], NP PCP-Cardiologist: Fransico Him, MD  HF Cardiologist: Dr. Haroldine Laws  HPI: Jennifer Hogan is a 51 y.o.with HFrEF, CAD, status post coronary artery bypass and graft (LIMA to the LAD/ramus intermedius and RIMA to the obtuse marginal), DM Type I,  anemia, anxiety, hypothyroidism, and hyperlipidemia.    Admitted 09/2020 with DKA and NSTEMI.  Hospital course complicated by acute respiratory failure and pulmonary edema. Had ECHO with reduced EF down to 20-25%. LHC showed severe CAD with total occlusion of the proximal LAD, moderate stenosis of the intermediate branch, and severe ostial circumflex in-stent restenosis. Diuresed with IV lasix. Discharged on Toprol Xl and spironolactone. Discharged to home on 10/06/2020.    Admitted 4/23 with NSTEMI and lactic acidosis. Echo EF 30-35% Cath with stable anatomy.  No culprit.  Severe 2 vessel obstructive CAD (LAD CTO, Ost LCx 99%, RCA distal 60%) Patent sequential LIMA graft  to the ramus intermediate and distal LAD Patent RIMA to the distal LCx Moderately elevated LVEDP  Admitted 5/23 with DKA.   Today she returns for HF follow up. Now working as Data processing manager. Diabetes fairly well controlled. Just got continuous glucose monitor. Denies CP. Has a lot of wheezing and using inhaler a lot.  Very fatigued. No edema, orthopnea or PND. Compliant with meds.      Echo (12/27/20)  EF 40-45% RV mildly HK Personally reviewed Echo 4/23: Echo EF 30-35%    Cardiac Testing  Echo 09/2020 EF 20-25%  Echo 11/2018 EF 55-60%    Cath 09/2020   Mid LAD lesion is 60% stenosed.   Ost Cx to Prox Cx lesion is 99% stenosed.   Prox Cx to Mid Cx lesion is 70% stenosed.   Ramus-1 lesion is 60% stenosed.   Ramus-2 lesion is 20% stenosed.   Ramus-3 lesion is 70% stenosed.   Prox LAD to Mid LAD lesion is 100% stenosed.   Dist LAD-1 lesion is 70% stenosed.   Dist LAD-2 lesion is 70% stenosed.    Non-stenotic Mid LAD to Dist LAD lesion was previously treated.   RIMA graft was visualized by angiography and is small.   LIMA and is large.   The graft exhibits no disease.   LV end diastolic pressure is normal.  Severe native vessel CAD with total occlusion of the proximal LAD, moderate stenosis of the intermediate branch, and severe ostial circumflex in-stent restenosis S/P CABG with patency of the both the RIMA-circumflex and LAD sequential to the intermediate   Past Medical History:  Diagnosis Date   Allergy    Anemia    Anxiety    Asthma    Cataract    CHF (congestive heart failure) ([place])    Coronary artery disease    a. s/p PTCA DES x3 in the LAD (ostial DES placed, mid DES placed, distal DES placed) and PTCA/DES x1 to intermediate branch 02/2017. b. ACS 02/08/18 with LCx stent placement.   Depression    Diabetes mellitus    type 1, patient is not using her insulin pump.   DKA (diabetic ketoacidoses) 12/31/2017   Dyspnea    with exertion and dust, no oxygen   Elevated platelet count    GERD (gastroesophageal reflux disease)    Heart murmur    High cholesterol    Hypothyroidism    Ischemic cardiomyopathy    a. EF low-normal with basal inferior akinesis, basal septal hypokinesis by echo 01/2018.   Myocardial infarction ([place]) 2019  Substance abuse ([place])    Current Outpatient Medications  Medication Sig Dispense Refill   acetaminophen (TYLENOL) 325 MG tablet Take 650 mg by mouth daily as needed for mild pain, fever or headache.     albuterol (VENTOLIN HFA) 108 (90 Base) MCG/ACT inhaler INHALE 2 PUFFS INTO THE LUNGS EVERY 6 (SIX) HOURS AS NEEDED FOR WHEEZING OR SHORTNESS OF BREATH. 18 g 2   Alirocumab (PRALUENT) 150 MG/ML SOAJ Inject 1 pen  into the skin every 14 (fourteen) days. 2 mL 11   aspirin 81 MG EC tablet Take 1 tablet (81 mg total) by mouth daily. 90 tablet 0   b complex vitamins tablet Take 1 tablet by mouth 3 (three) times a week.     buPROPion (WELLBUTRIN XL) 300  MG 24 hr tablet Take 1 tablet (300 mg total) by mouth every morning. 30 tablet 2   clopidogrel (PLAVIX) 75 MG tablet TAKE 1 TABLET BY MOUTH DAILY WITH BREAKFAST. 90 tablet 3   diclofenac Sodium (VOLTAREN) 1 % GEL Apply 2-4 g topically as needed (knee pain).     diphenhydrAMINE (BENADRYL) 25 MG tablet Take 37.5 mg by mouth at bedtime.     fluticasone (FLONASE) 50 MCG/ACT nasal spray Place 1 spray into both nostrils at bedtime as needed for allergies.     furosemide (LASIX) 20 MG tablet Take 1 tablet (20 mg total) by mouth daily as needed (for weight gain). 15 tablet 3   hydrOXYzine (ATARAX) 25 MG tablet Take 1 tablet (25 mg total) by mouth 3 (three) times daily as needed. 90 tablet 1   insulin lispro (HUMALOG) 100 UNIT/ML injection Inject 35 units into the skin daily via pump 10 mL 1   levothyroxine (SYNTHROID) 100 MCG tablet TAKE 1 TABLET (100 MCG TOTAL) BY MOUTH DAILY BEFORE BREAKFAST. 90 tablet 3   metoCLOPramide (REGLAN) 5 MG tablet Take 1 tablet (5 mg total) by mouth every 12 (twelve) hours as needed for nausea or vomiting. 60 tablet 3   metoprolol succinate (TOPROL-XL) 25 MG 24 hr tablet Take 0.5 tablets (12.5 mg total) by mouth daily. 15 tablet 6   montelukast (SINGULAIR) 10 MG tablet Take 1 tablet (10 mg total) by mouth at bedtime. 90 tablet 2   Multiple Vitamin (MULTIVITAMIN WITH MINERALS) TABS tablet Take 1 tablet by mouth daily.     nitroGLYCERIN (NITROSTAT) 0.4 MG SL tablet Place 1 tablet (0.4 mg total) under the tongue every 5 (five) minutes as needed for chest pain. 25 tablet 6   omeprazole (PRILOSEC OTC) 20 MG tablet Take 20 mg by mouth daily as needed (acid reflux).     OVER THE COUNTER MEDICATION Take 3 capsules by mouth daily before breakfast. [employer] Super Lysine Immune Support     spironolactone (ALDACTONE) 25 MG tablet Take 12.5 mg by mouth daily.     budesonide (PULMICORT) 0.25 MG/2ML nebulizer solution Take 0.25 mg by nebulization as needed (shortness of  breath/wheezing). (Patient not taking: Reported on 11/03/2021)     Glucagon 3 MG/DOSE POWD Place 3 mg into the nose once as needed for up to 1 dose. (Patient not taking: Reported on 11/03/2021) 1 each 11   No current facility-administered medications for this encounter.   Allergies  Allergen Reactions   Atorvastatin Other (See Comments)    Myalgias    Crestor [Rosuvastatin Calcium] Other (See Comments)    Severe myalgias and joint pain. 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"   Occupation: adjunct facilty at [employer]    Comment: Chemistry professor  Tobacco Use   Smoking status: Former    Types: Cigarettes   Smokeless tobacco: Never   Tobacco comments:    Smoked cigarettes in her 20's for 1 year  Vaping Use   Vaping Use: Never used  Substance and Sexual Activity   Alcohol use: Yes    Comment: Socially   Drug use: Yes    Types: Marijuana    Comment: a few times weekly   Sexual activity: Not Currently    Birth control/protection: None  Other Topics Concern   Not on file  Social History Narrative   Not on file   Social Determinants of Health   Financial Resource Strain: High Risk (10/05/2020)   Overall Financial Resource Strain (CARDIA)    Difficulty of Paying Living Expenses: Very hard  Food Insecurity: Food Insecurity Present (10/05/2020)   Hunger Vital Sign    Worried About [place] in the Last Year:  Sometimes true    Ran Out of Food in the Last Year: Sometimes true  Transportation Needs: No Transportation Needs (10/05/2020)   PRAPARE - Hydrologist (Medical): No    Lack of Transportation (Non-Medical): No  Physical Activity: Not on file  Stress: Not on file  Social Connections: Unknown (12/07/2017)   Social Connection and Isolation Panel [NHANES]    Frequency of Communication with Friends and Family: Patient refused    Frequency of Social Gatherings with Friends and Family: Patient refused    Attends Religious Services: Patient refused    Active Member of Clubs or Organizations: Patient refused    Attends Archivist Meetings: Patient refused    Marital Status: Patient refused  Intimate Partner Violence: Not At Risk (12/07/2017)   Humiliation, Afraid, Rape, and Kick questionnaire    Fear of Current or Ex-Partner: No    Emotionally Abused: No    Physically Abused: No    Sexually Abused: No   Family History  Problem Relation Age of Onset   Cancer Mother        T cell lymphoma   Hypertension Mother    Hypercalcemia Mother    OCD Father    Anxiety disorder Father    Coronary artery disease Father    Congestive Heart Failure Father    Asthma Father    Hypercalcemia Father    Hypertension Father    Colon polyps Father    Depression Maternal Grandmother    Diabetes Paternal Grandmother    Stomach cancer Neg Hx    Esophageal cancer Neg Hx    Colon cancer Neg Hx    Rectal cancer Neg Hx    BP 110/70   Pulse 70   Wt 69.1 kg (152 lb 6.4 oz)   SpO2 99%   BMI 25.36 kg/m   Wt Readings from Last 3 Encounters:  11/03/21  69.1 kg (152 lb 6.4 oz)  09/29/21 67.1 kg (148 lb)  09/28/21 67.3 kg (148 lb 6.4 oz)   PHYSICAL EXAM: General:  Well appearing. No resp difficulty HEENT: normal Neck: supple. no JVD. Carotids 2+ bilat; no bruits. No lymphadenopathy or thryomegaly appreciated. Cor: PMI nondisplaced. Regular rate & rhythm. No rubs,  gallops or murmurs. Lungs: clear Abdomen: soft, nontender, nondistended. No hepatosplenomegaly. No bruits or masses. Good bowel sounds. Extremities: no cyanosis, clubbing, rash, edema Neuro: alert & orientedx3, cranial nerves grossly intact. moves all 4 extremities w/o difficulty. Affect pleasant   ASSESSMENT & PLAN:  Chronic HFrEF, ICM - s/p CABG 10/20 - Echo in 2020 showed EF 55-60%.  - Most recent Echo 09/2020 EF 20-25%.  - LHC with 09/2020 with severe coronary disease. total occlusion of the proximal LAD, moderate stenosis of the intermediate branch, and severe ostial circumflex in-stent restenosis.  - Echo 12/27/20  EF 40-45% RV mildly HK Personally reviewed - Echo EF 30-35%  - NYHA III Mostly due to fatigue. Volume status ok. Has not needed lasix - Continue Toprol XL 12.5 mg qhs. Would like to titrate as tolerated.  - Off spiro due to hyperkalemia (failed 2x) - ARB not started due to hyperkalemia. Will recheck potassium today. If potassium is < 4.8 will start losartan 12.5 & recheck BMET 2 weeks.  - Not a candidate for SGLT2 due to DMI  - Would hold off on ICD for now until we see if EF recovers with GDMT - repeat echo 4-6 months   2. CAD  - H/O CABG 10/20 - NSTEMI in setting of DKA 9/22. LHC severe coronary disease with total occlusion of the proximal LAD, moderate stenosis of the intermediate branch, and severe ostial circumflex in-stent restenosis. Patent LIMA to LAD-> RI and RIMA -> LCX - Admitted 4/23 with NSTEMI. Cath with stable anatomy.  No culprit.    -Severe 2 vessel obstructive CAD (LAD CTO, Ost LCx 99%, RCA distal 60%)    - Patent sequential LIMA graft  to the ramus intermediate and distal LAD     - Patent RIMA to the distal LCx     - Moderately elevated LVEDP - No angia - On aspirin, plavix.  - Not taking statin due to myalgias (on [facility]-9) - Unable to do CR with no insurance.   3. Hypothyroidism  - Followed by Dr Loanne Drilling    4. DMI  - Uncontrolled. Followed by  Dr Loanne Drilling.  - Last A1c was 8.5 - Now has CGM device  - No SGLT2i. - See discussion above about ARB/potassium binder  Glori Bickers, MD 11/03/21

## 2021-11-03 NOTE — Addendum Note (Signed)
Encounter addended by: Asencion Noble, CMA on: 11/03/2021 11:59 AM  Actions taken: Flowsheet accepted, Clinical Note Signed

## 2021-11-07 ENCOUNTER — Encounter (HOSPITAL_COMMUNITY): Payer: Self-pay

## 2021-11-08 ENCOUNTER — Other Ambulatory Visit: Payer: Self-pay

## 2021-11-09 ENCOUNTER — Ambulatory Visit (HOSPITAL_COMMUNITY)
Admission: RE | Admit: 2021-11-09 | Discharge: 2021-11-09 | Disposition: A | Discharge status: Home / Self Care | Payer: Medicaid Other | Source: Ambulatory Visit | Attending: Internal Medicine | Admitting: Internal Medicine

## 2021-11-09 ENCOUNTER — Other Ambulatory Visit: Payer: Self-pay

## 2021-11-09 DIAGNOSIS — I5022 Chronic systolic (congestive) heart failure: Secondary | ICD-10-CM | POA: Diagnosis present

## 2021-11-09 LAB — BASIC METABOLIC PANEL
Anion gap: 6 (ref 5–15)
BUN: 12 mg/dL (ref 6–20)
CO2: 25 mmol/L (ref 22–32)
Calcium: 8.9 mg/dL (ref 8.9–10.3)
Chloride: 104 mmol/L (ref 98–111)
Creatinine, Ser: 0.93 mg/dL (ref 0.44–1.00)
GFR, Estimated: >60 mL/min (ref >60)
Glucose, Bld: 203 mg/dL — ABNORMAL HIGH (ref 70–99)
Potassium: 4.6 mmol/L (ref 3.5–5.1)
Sodium: 135 mmol/L (ref 135–145)

## 2021-11-14 ENCOUNTER — Telehealth (HOSPITAL_COMMUNITY): Payer: Self-pay | Admitting: *Deleted

## 2021-11-14 NOTE — Telephone Encounter (Signed)
Patient LVM stated that she was placed on   buPROPion (WELLBUTRIN XL) 300 MG 24 hr tablet And "stated  that she's ragging out x 3 weeks  She's hitting walls & that's not like her" Requested a call back  # [phone]

## 2021-11-15 ENCOUNTER — Other Ambulatory Visit: Payer: Self-pay

## 2021-11-15 ENCOUNTER — Telehealth (HOSPITAL_COMMUNITY): Payer: Self-pay | Admitting: Psychiatry

## 2021-11-15 MED ORDER — BUPROPION HCL ER (XL) 150 MG PO TB24
150.0000 mg | ORAL_TABLET | ORAL | 0 refills | Status: DC
Start: 2021-11-15 — End: 2021-11-25
  Filled 2021-11-15 (×2): qty 14, 14d supply, fill #0

## 2021-11-15 NOTE — Telephone Encounter (Signed)
Received message from clinical staff that patient called reporting that she has been "ragging out x 3 weeks, hitting walls & that's not like her."   Called patient back on 11/15/21 for approx. 10 minute phone call: She feels that since increase in Wellbutrin XL to 300 mg her irritability has worsened and has been feeling more angry/tense. Endorses having reactions that are out of proportion to triggers and she states things reached a tipping point yesterday when she got so angry that she stomped on a plastic jar of peanut butter (no injury resulted). Outside of these episodes, she feels mood has actually been "okay" and has found her motivation/energy to be good. Continues to work part-time. Denies SI/HI. Patient is amenable to returning to Wellbutrin XL 150 mg dose and monitoring for improvement in irritability at reduced dose. Will send in 14-day supply to preferred pharmacy. Next visit with this writer scheduled 11/25/21 at [facility] and will assess at that visit indication to switch to alternative agent vs. add augmentation to low-dose Wellbutrin. She has not yet heard back regarding referral for individual therapy; this writer will check in with front desk to have them reach out to get her scheduled.  Alda Berthold, MD 11/15/21

## 2021-11-17 ENCOUNTER — Other Ambulatory Visit (HOSPITAL_COMMUNITY): Payer: Medicaid Other

## 2021-11-18 ENCOUNTER — Other Ambulatory Visit: Payer: Self-pay

## 2021-11-18 ENCOUNTER — Other Ambulatory Visit: Payer: Self-pay | Admitting: Nurse Practitioner

## 2021-11-18 ENCOUNTER — Encounter: Payer: Self-pay | Admitting: Nurse Practitioner

## 2021-11-18 DIAGNOSIS — M2550 Pain in unspecified joint: Secondary | ICD-10-CM

## 2021-11-21 ENCOUNTER — Other Ambulatory Visit: Payer: Self-pay

## 2021-11-22 ENCOUNTER — Ambulatory Visit: Payer: Medicaid Other | Attending: Nurse Practitioner

## 2021-11-22 ENCOUNTER — Other Ambulatory Visit: Payer: Self-pay

## 2021-11-23 ENCOUNTER — Encounter (HOSPITAL_COMMUNITY): Payer: Self-pay | Admitting: Ophthalmology

## 2021-11-23 ENCOUNTER — Encounter: Payer: Self-pay | Admitting: Nurse Practitioner

## 2021-11-23 LAB — URIC A+ANA+RA QN+CRP+ASO
ASO: 41 IU/mL (ref 0.0–200.0)
Anti Nuclear Antibody (ANA): NEGATIVE
CRP: 2 mg/L (ref 0–10)
Rheumatoid fact SerPl-aCnc: <10 IU/mL (ref <14.0)
Uric Acid: 4.7 mg/dL (ref 3.0–7.2)

## 2021-11-23 NOTE — Anesthesia Preprocedure Evaluation (Signed)
Anesthesia Evaluation  Patient identified by MRN, date of birth, ID band Patient awake    Reviewed: Allergy & Precautions, NPO status , Patient's Chart, lab work & pertinent test results  Airway Mallampati: II  TM Distance: >3 FB Neck ROM: Full    Dental no notable dental hx.    Pulmonary shortness of breath, asthma , former smoker   Pulmonary exam normal breath sounds clear to auscultation       Cardiovascular hypertension, Pt. on home beta blockers and Pt. on medications + CAD, + Past MI, + CABG and +CHF  Normal cardiovascular exam+ Valvular Problems/Murmurs  Rhythm:Regular Rate:Normal  ECHO 4/23 1. Diffuse hypokinesis worse in septum and apex . Left ventricular ejection fraction, by estimation, is 30 to 35%. The left ventricle has moderately decreased function. The left ventricle demonstrates global hypokinesis. The left ventricular internal cavity size was severely dilated. Left ventricular diastolic parameters are indeterminate.  2. Right ventricular systolic function is normal. The right ventricular size is normal. There is normal pulmonary artery systolic pressure.  3. The mitral valve is abnormal. Mild mitral valve regurgitation. No evidence of mitral stenosis.  4. The aortic valve is tricuspid. Aortic valve regurgitation is not visualized. No aortic stenosis is present.  5. The inferior vena cava is normal in size with greater than 50% respiratory variability, suggesting right atrial pressure of 3 mmHg.    Neuro/Psych  PSYCHIATRIC DISORDERS Anxiety Depression       GI/Hepatic Neg liver ROS,GERD  Medicated and Controlled,,  Endo/Other  diabetes, Type 1Hypothyroidism    Renal/GU Renal disease     Musculoskeletal   Abdominal   Peds  Hematology  (+) Blood dyscrasia, anemia   Anesthesia Other Findings   Reproductive/Obstetrics                             Anesthesia Physical Anesthesia  Plan  ASA: 4  Anesthesia Plan: MAC   Post-op Pain Management: Tylenol PO (pre-op)*   Induction: Intravenous  PONV Risk Score and Plan: 2 and Ondansetron, Propofol infusion, Treatment may vary due to age or medical condition, Midazolam and TIVA  Airway Management Planned: Simple Face Mask and Natural Airway  Additional Equipment: None  Intra-op Plan:   Post-operative Plan:   Informed Consent: I have reviewed the patients History and Physical, chart, labs and discussed the procedure including the risks, benefits and alternatives for the proposed anesthesia with the patient or authorized representative who has indicated his/her understanding and acceptance.     Dental advisory given  Plan Discussed with: CRNA  Anesthesia Plan Comments: (PAT note written 11/23/2021 by Myra Gianotti, PA-C. )        Anesthesia Quick Evaluation

## 2021-11-23 NOTE — Progress Notes (Signed)
Left voicemail for patient with new arrival time of 1100 tomorrow, clear liquids okay until 1045.

## 2021-11-23 NOTE — Progress Notes (Signed)
PCP - [name], NP Cardiologist - Dr Fransico Him HF Cardiologist - Dr Raymon Mutton Endocrinology - Dr Ian Malkin - Dr Montezuma Cellar  Chest x-ray - 05/30/21 (1V) EKG - 05/30/21 Stress Test - n/a ECHO - 05/11/21 Cardiac Cath - 05/11/21  ICD Pacemaker/Loop - n/a  Sleep Study -  n/a CPAP - none  Diabetes Type 1 - Humalog Insulin Pump - Reduce basal rate by 20% tonight at midnight. Patient will not have to turn off her insulin pump for the posted 1 1/2 hr procedure.  If your blood sugar is less than 70 mg/dL, you will need to treat for low blood sugar: Treat a low blood sugar (less than 70 mg/dL) with  cup of clear juice (cranberry or apple), 4 glucose tablets, OR glucose gel. Recheck blood sugar in 15 minutes after treatment (to make sure it is greater than 70 mg/dL). If your blood sugar is not greater than 70 mg/dL on recheck, call [phone] for further instructions.  Aspirin/Blood Instructions: Follow your surgeon's instructions on when to stop aspirin and plavix prior to surgery.  Last ASA & Plavix dose was on 11/22/21 per patient.  Called MD's office, spoke with Ginger to make them aware of the above information.   ERAS: Clear liquids til 11:15 AM DOS.  Anesthesia review: Yes  STOP now taking any Aspirin (unless otherwise instructed by your surgeon), Aleve, Naproxen, Ibuprofen, Motrin, Advil, Goody's, BC's, all herbal medications, fish oil, and all vitamins.   Coronavirus Screening Do you have any of the following symptoms:  Cough yes/no: No Fever (>100.90F)  yes/no: No Runny nose yes/no: No Sore throat yes/no: No Difficulty breathing/shortness of breath  yes/no: No  Have you traveled in the last 14 days and where? yes/no: No  Patient verbalized understanding of instructions that were given via phone.

## 2021-11-23 NOTE — Progress Notes (Signed)
Anesthesia Chart Review: SAME DAY WORK-UP  Case: [id number] Date/Time: 11/24/21 1407   Procedure: 33 GAUGE PARS PLANA VITRECTOMY APPROACH WITH ENDOLASER AND PAN RETINAL PHOTOCOAGULATION, CATARACT EXTRACTION RIGHT EYE (Right)   Anesthesia type: Monitor Anesthesia Care   Pre-op diagnosis: vitreous hemorrhage, cataract right eye   Location: [facility] OR ROOM 08 / [place] OR   Surgeons: Jalene Mullet, MD       DISCUSSION: Patient is a 51 year old female scheduled for the above procedure.   History includes former smoker, CAD (NSTEMI, s/p DES LAD x3 & DES INTMED branch 03/20/17; ACS s/p DES CX 02/08/18;  s/p CABG: LIMA-LAD-RI, RIMA-OM 11/04/18; NSTEMI 09/2020 in setting of DKA, continue medical therapy; NSTEMI and lactic acidosis, no culprit lesion with patent grafts 05/11/21), ischemic cardiomyopathy, HFrEF, murmur (mild MR 05/11/21), hypercholesterolemia, DM1 (on insulin pump, Humalog 100U/mL, last DKA admission 05/2021), hypothyroidism, anemia, exertional dyspnea, GERD, substance abuse.   Last visit with cardiologist Dr. Haroldine Laws was on 11/03/21. EF 30-35% 04/2021 with patent grafts for cath. No angina. NYHA III Mostly due to fatigue. Volume status ok. Had not needed Lasix. Off spironolactone and had not started ARB due to hyperkalemia. Plan to repeat echo in 4-6 months to see if EF improves.   She is on ASA and Plavix, last dose 11/22/21. Defer perioperative instructions to Dr. Neita Garnet RN notified his staff.    She is a same-day work-up, so anesthesia team to evaluate on the day of surgery.   VS:  BP Readings from Last 3 Encounters:  11/03/21 110/70  09/29/21 99/65  09/28/21 (!) 146/85   Pulse Readings from Last 3 Encounters:  11/03/21 70  09/29/21 82  09/28/21 66     PROVIDERS: [name], NP is PCP  Fransico Him, MD is cardiologist Glori Bickers, MD is HF cardiologist Philemon Kingdom, MD endocrinologist. Previously she saw Renato Shin, MD. Ramer Cellar, MD is  GI   LABS: She is for updated labs on arrival. Most recent lab results in [place] include: Lab Results  Component Value Date   WBC 17.5 (H) 05/30/2021   HGB 14.6 05/30/2021   HCT 43.0 05/30/2021   PLT 564 (H) 05/30/2021   GLUCOSE 203 (H) 11/09/2021   ALT 20 11/03/2021   AST 29 11/03/2021   NA 135 11/09/2021   K 4.6 11/09/2021   CL 104 11/09/2021   CREATININE 0.93 11/09/2021   BUN 12 11/09/2021   CO2 25 11/09/2021   TSH 2.505 05/15/2021   INR 1.0 03/02/2020   HGBA1C 8.0 (A) 07/01/2021     IMAGES: 1V PCXR  05/30/21: FINDINGS: Again seen are sternotomy wires and mediastinal surgical clips. The heart size and mediastinal contours are within normal limits. Both lungs are clear with interval resolution of the consolidation in the right upper lobe and patchy opacities in the remainder of the lungs. The visualized skeletal structures are unremarkable. IMPRESSION: Lungs are clear with interval resolution of the consolidation in the right upper lobe and patchy opacities in the remainder of the lungs.     EKG: 05/30/21: Sinus tachycardia at 114 bpm Right atrial enlargement Right axis deviation ST & T wave abnormality, consider inferolateral ischemia Abnormal ECG When compared with ECG of 15-May-2021 23:48, PREVIOUS ECG IS PRESENT Confirmed by Ronnald Nian, Adam ([id number]) on 05/30/2021 3:09:20 PM     CV: Echo 05/11/21:  IMPRESSIONS   1. Diffuse hypokinesis worse in septum and apex . Left ventricular  ejection fraction, by estimation, is 30 to 35%. The left ventricle has  moderately decreased function. The left ventricle demonstrates global  hypokinesis. The left ventricular internal  cavity size was severely dilated. Left ventricular diastolic parameters  are indeterminate.   2. Right ventricular systolic function is normal. The right ventricular  size is normal. There is normal pulmonary artery systolic pressure.   3. The mitral valve is abnormal. Mild mitral valve regurgitation. No   evidence of mitral stenosis.   4. The aortic valve is tricuspid. Aortic valve regurgitation is not  visualized. No aortic stenosis is present.   5. The inferior vena cava is normal in size with greater than 50%  respiratory variability, suggesting right atrial pressure of 3 mmHg.  - Comparison 12/27/20: LVEF 40-45%, hypokinesis entire anteroseptal wall, RVSF mildly reduced, normal RASP, trivial MR.     Cardiac cath 05/11/21:   Prox LAD to Mid LAD lesion is 100% stenosed.   Dist LAD-1 lesion is 70% stenosed.   Dist LAD-2 lesion is 70% stenosed.   Mid LAD lesion is 60% stenosed.   Ost Cx to Prox Cx lesion is 99% stenosed.   Prox Cx to Mid Cx lesion is 70% stenosed.   Ramus-1 lesion is 60% stenosed.   Ramus-2 lesion is 20% stenosed.   Ramus-3 lesion is 70% stenosed.   RPAV lesion is 60% stenosed.   Non-stenotic Mid LAD to Dist LAD lesion was previously treated.   LIMA graft was visualized by angiography and is normal in caliber.   RIMA graft was visualized by non-selective angiography and is normal in caliber.   The graft exhibits no disease.   LV end diastolic pressure is moderately elevated.   Severe 2 vessel obstructive CAD Patent sequential LIMA graft  to the ramus intermediate and distal LAD Patent RIMA to the distal LCx Moderately elevated LVEDP   Compared to September 2022 there is no significant angiographic change. Recommend continued medical therapy.      US Carotid 10/31/18: Summary:  - Right Carotid: Velocities in the right ICA are consistent with a 1-39%  stenosis.  - Left Carotid: Velocities in the left ICA are consistent with a 1-39%  stenosis.  - Vertebrals:  Bilateral vertebral arteries demonstrate antegrade flow.  - Subclavians: Normal flow hemodynamics were seen in bilateral subclavian               arteries.   Past Medical History:  Diagnosis Date   Allergy    Anemia    Anxiety    Asthma    Cataract    CHF (congestive heart failure) (HCC)    Coronary  artery disease    a. s/p PTCA DES x3 in the LAD (ostial DES placed, mid DES placed, distal DES placed) and PTCA/DES x1 to intermediate branch 02/2017. b. ACS 02/08/18 with LCx stent placement.   Depression    Diabetes mellitus    type 1, patient is not using her insulin pump.   DKA (diabetic ketoacidoses) 12/31/2017   Dyspnea    with exertion and dust, no oxygen   Elevated platelet count    GERD (gastroesophageal reflux disease)    Heart murmur    High cholesterol    Hypothyroidism    Ischemic cardiomyopathy    a. EF low-normal with basal inferior akinesis, basal septal hypokinesis by echo 01/2018.   Myocardial infarction [facility]) 2019   Substance abuse [facility])     Past Surgical History:  Procedure Laterality Date   APPENDECTOMY     BIOPSY  09/29/2021   Procedure: BIOPSY;  Surgeon: Carmichaels Cellar  P, MD;  [facility]: [facility];  Service: Gastroenterology;;   COLONOSCOPY     COLONOSCOPY WITH PROPOFOL N/A 09/29/2021   Procedure: COLONOSCOPY WITH PROPOFOL;  Surgeon: Yetta Flock, MD;  [facility];  Service: Gastroenterology;  Laterality: N/A;   CORONARY ARTERY BYPASS GRAFT N/A 11/04/2018   Procedure: CORONARY ARTERY BYPASS GRAFTING (CABG), ON PUMP, TIMES THREE, USING LEFT AND RIGHT INTERNAL MAMMARY ARTERIES;  Surgeon: Wonda Olds, MD;  Location: [place];  Service: Open Heart Surgery;  Laterality: N/A;   CORONARY STENT INTERVENTION N/A 03/20/2017   Procedure: DES x 3 LAD, DES OM; Surgeon: Burnell Blanks, MD;  Location: [place] CV LAB;  Service: Cardiovascular;  Laterality: N/A;   CORONARY/GRAFT ACUTE MI REVASCULARIZATION N/A 02/08/2018   Procedure: Coronary/Graft Acute MI Revascularization;  Surgeon: Belva Crome, MD;  Location: [place] CV LAB;  Service: Cardiovascular;  Laterality: N/A;   ESOPHAGOGASTRODUODENOSCOPY (EGD) WITH PROPOFOL N/A 09/29/2021   Procedure: ESOPHAGOGASTRODUODENOSCOPY (EGD) WITH PROPOFOL;  Surgeon: Yetta Flock, MD;   [facility];  Service: Gastroenterology;  Laterality: N/A;   GAS INSERTION Right 04/28/2021   Procedure: INSERTION OF GAS ( C3F8);  Surgeon: Jalene Mullet, MD;  Location: [place];  Service: Ophthalmology;  Laterality: Right;  Right eye   GAS/FLUID EXCHANGE Right 04/28/2021   Procedure: GAS/FLUID EXCHANGE;  Surgeon: Jalene Mullet, MD;  Location: [place];  Service: Ophthalmology;  Laterality: Right;  Right eye   LEFT HEART CATH AND CORONARY ANGIOGRAPHY N/A 03/20/2017   Procedure: LEFT HEART CATH AND CORONARY ANGIOGRAPHY;  Surgeon: Burnell Blanks, MD;  Location: [place] CV LAB;  Service: Cardiovascular;  Laterality: N/A;   LEFT HEART CATH AND CORONARY ANGIOGRAPHY N/A 02/08/2018   Procedure: LEFT HEART CATH AND CORONARY ANGIOGRAPHY;  Surgeon: Belva Crome, MD;  Location: [place] CV LAB;  Service: Cardiovascular;  Laterality: N/A;   LEFT HEART CATH AND CORONARY ANGIOGRAPHY N/A 10/29/2018   Procedure: LEFT HEART CATH AND CORONARY ANGIOGRAPHY;  Surgeon: Burnell Blanks, MD;  Location: [place] CV LAB;  Service: Cardiovascular;  Laterality: N/A;   LEFT HEART CATH AND CORS/GRAFTS ANGIOGRAPHY N/A 10/04/2020   Procedure: LEFT HEART CATH AND CORS/GRAFTS ANGIOGRAPHY;  Surgeon: Sherren Mocha, MD;  Location: [place] CV LAB;  Service: Cardiovascular;  Laterality: N/A;   LEFT HEART CATH AND CORS/GRAFTS ANGIOGRAPHY N/A 05/11/2021   Procedure: LEFT HEART CATH AND CORS/GRAFTS ANGIOGRAPHY;  Surgeon: Martinique, Peter M, MD;  Location: [place] CV LAB;  Service: Cardiovascular;  Laterality: N/A;   PARS PLANA VITRECTOMY Right 04/28/2021   Procedure: PARS PLANA VITRECTOMY WITH 25 GAUGE REMOVAL OF TRATIONAL MEMBRANE RIGHT EYE; DRAINAGE OF SUBRETINAL FLUID RIGHT EYE;  Surgeon: Jalene Mullet, MD;  Location: [place];  Service: Ophthalmology;  Laterality: Right;  Right eye   PHOTOCOAGULATION WITH LASER Right 04/28/2021   Procedure: PHOTOCOAGULATION WITH LASER;  Surgeon: Jalene Mullet, MD;  Location: [place];  Service: Ophthalmology;  Laterality: Right;  Right eye   POLYPECTOMY  09/29/2021   Procedure: POLYPECTOMY;  Surgeon: Yetta Flock, MD;  [facility];  Service: Gastroenterology;;   TEE WITHOUT CARDIOVERSION N/A 11/04/2018   Procedure: TRANSESOPHAGEAL ECHOCARDIOGRAM (TEE);  Surgeon: Wonda Olds, MD;  Location: [place];  Service: Open Heart Surgery;  Laterality: N/A;    MEDICATIONS: No current facility-administered medications for this encounter.    acetaminophen (TYLENOL) 325 MG tablet   albuterol (VENTOLIN HFA) 108 (90 Base) MCG/ACT inhaler   Alirocumab (PRALUENT) 150 MG/ML SOAJ  aspirin 81 MG EC tablet   b complex vitamins tablet   budesonide (PULMICORT) 0.25 MG/2ML nebulizer solution   buPROPion (WELLBUTRIN XL) 150 MG 24 hr tablet   clopidogrel (PLAVIX) 75 MG tablet   diclofenac Sodium (VOLTAREN) 1 % GEL   diphenhydrAMINE (BENADRYL) 25 MG tablet   fluticasone (FLONASE) 50 MCG/ACT nasal spray   furosemide (LASIX) 20 MG tablet   Glucagon 3 MG/DOSE POWD   hydrOXYzine (ATARAX) 25 MG tablet   insulin lispro (HUMALOG) 100 UNIT/ML injection   levothyroxine (SYNTHROID) 100 MCG tablet   losartan (COZAAR) 25 MG tablet   metoCLOPramide (REGLAN) 5 MG tablet   metoprolol succinate (TOPROL-XL) 25 MG 24 hr tablet   montelukast (SINGULAIR) 10 MG tablet   Multiple Vitamin (MULTIVITAMIN WITH MINERALS) TABS tablet   nitroGLYCERIN (NITROSTAT) 0.4 MG SL tablet   omeprazole (PRILOSEC OTC) 20 MG tablet   OVER THE COUNTER MEDICATION   spironolactone (ALDACTONE) 25 MG tablet    Myra Gianotti, PA-C Surgical Short Stay/Anesthesiology [facility] Phone [phone] [facility] Phone [phone] 11/23/2021 5:01 PM

## 2021-11-24 ENCOUNTER — Encounter (HOSPITAL_COMMUNITY)
Admission: RE | Disposition: A | Discharge status: Home / Self Care | Payer: Self-pay | Source: Ambulatory Visit | Attending: Ophthalmology

## 2021-11-24 ENCOUNTER — Ambulatory Visit (INDEPENDENT_AMBULATORY_CARE_PROVIDER_SITE_OTHER): Payer: Medicaid Other | Admitting: Licensed Clinical Social Worker

## 2021-11-24 ENCOUNTER — Ambulatory Visit (HOSPITAL_COMMUNITY)
Admission: RE | Admit: 2021-11-24 | Discharge: 2021-11-24 | Disposition: A | Discharge status: Home / Self Care | Payer: Medicaid Other | Source: Ambulatory Visit | Attending: Cardiology | Admitting: Cardiology

## 2021-11-24 ENCOUNTER — Ambulatory Visit (HOSPITAL_BASED_OUTPATIENT_CLINIC_OR_DEPARTMENT_OTHER): Payer: Medicaid Other | Admitting: Vascular Surgery

## 2021-11-24 ENCOUNTER — Ambulatory Visit (HOSPITAL_COMMUNITY)
Admission: RE | Admit: 2021-11-24 | Discharge: 2021-11-24 | Disposition: A | Discharge status: Home / Self Care | Payer: Medicaid Other | Source: Ambulatory Visit | Attending: Ophthalmology | Admitting: Ophthalmology

## 2021-11-24 ENCOUNTER — Encounter (HOSPITAL_COMMUNITY): Payer: Self-pay | Admitting: Ophthalmology

## 2021-11-24 ENCOUNTER — Other Ambulatory Visit: Payer: Self-pay

## 2021-11-24 ENCOUNTER — Encounter: Payer: Medicaid Other | Admitting: *Deleted

## 2021-11-24 ENCOUNTER — Ambulatory Visit (HOSPITAL_COMMUNITY): Payer: Medicaid Other | Admitting: Vascular Surgery

## 2021-11-24 DIAGNOSIS — I110 Hypertensive heart disease with heart failure: Secondary | ICD-10-CM | POA: Diagnosis not present

## 2021-11-24 DIAGNOSIS — Z006 Encounter for examination for normal comparison and control in clinical research program: Secondary | ICD-10-CM

## 2021-11-24 DIAGNOSIS — H4311 Vitreous hemorrhage, right eye: Secondary | ICD-10-CM | POA: Insufficient documentation

## 2021-11-24 DIAGNOSIS — F332 Major depressive disorder, recurrent severe without psychotic features: Secondary | ICD-10-CM

## 2021-11-24 DIAGNOSIS — D759 Disease of blood and blood-forming organs, unspecified: Secondary | ICD-10-CM | POA: Diagnosis not present

## 2021-11-24 DIAGNOSIS — I2510 Atherosclerotic heart disease of native coronary artery without angina pectoris: Secondary | ICD-10-CM

## 2021-11-24 DIAGNOSIS — E1136 Type 2 diabetes mellitus with diabetic cataract: Secondary | ICD-10-CM | POA: Diagnosis not present

## 2021-11-24 DIAGNOSIS — Z87891 Personal history of nicotine dependence: Secondary | ICD-10-CM

## 2021-11-24 DIAGNOSIS — E039 Hypothyroidism, unspecified: Secondary | ICD-10-CM

## 2021-11-24 DIAGNOSIS — I5022 Chronic systolic (congestive) heart failure: Secondary | ICD-10-CM

## 2021-11-24 DIAGNOSIS — H269 Unspecified cataract: Secondary | ICD-10-CM | POA: Diagnosis not present

## 2021-11-24 DIAGNOSIS — D638 Anemia in other chronic diseases classified elsewhere: Secondary | ICD-10-CM

## 2021-11-24 DIAGNOSIS — D649 Anemia, unspecified: Secondary | ICD-10-CM | POA: Diagnosis not present

## 2021-11-24 DIAGNOSIS — I509 Heart failure, unspecified: Secondary | ICD-10-CM

## 2021-11-24 HISTORY — PX: LASER PHOTO ABLATION: SHX5942

## 2021-11-24 HISTORY — PX: 25 GAUGE PARS PLANA VITRECTOMY WITH 20 GAUGE MVR PORT: SHX6041

## 2021-11-24 HISTORY — PX: CATARACT EXTRACTION W/PHACO: SHX586

## 2021-11-24 LAB — CBC
HCT: 35.2 % — ABNORMAL LOW (ref 36.0–46.0)
Hemoglobin: 11.5 g/dL — ABNORMAL LOW (ref 12.0–15.0)
MCH: 30.7 pg (ref 26.0–34.0)
MCHC: 32.7 g/dL (ref 30.0–36.0)
MCV: 93.9 fL (ref 80.0–100.0)
Platelets: 405 10*3/uL — ABNORMAL HIGH (ref 150–400)
RBC: 3.75 MIL/uL — ABNORMAL LOW (ref 3.87–5.11)
RDW: 13.7 % (ref 11.5–15.5)
WBC: 5.4 10*3/uL (ref 4.0–10.5)
nRBC: 0 % (ref 0.0–0.2)

## 2021-11-24 LAB — BASIC METABOLIC PANEL
Anion gap: 5 (ref 5–15)
Anion gap: 8 (ref 5–15)
BUN: 14 mg/dL (ref 6–20)
BUN: 15 mg/dL (ref 6–20)
CO2: 24 mmol/L (ref 22–32)
CO2: 21 mmol/L — ABNORMAL LOW (ref 22–32)
Calcium: 9.3 mg/dL (ref 8.9–10.3)
Calcium: 9.3 mg/dL (ref 8.9–10.3)
Chloride: 110 mmol/L (ref 98–111)
Chloride: 108 mmol/L (ref 98–111)
Creatinine, Ser: 0.96 mg/dL (ref 0.44–1.00)
Creatinine, Ser: 0.9 mg/dL (ref 0.44–1.00)
GFR, Estimated: >60 mL/min (ref >60)
GFR, Estimated: >60 mL/min (ref >60)
Glucose, Bld: 130 mg/dL — ABNORMAL HIGH (ref 70–99)
Glucose, Bld: 86 mg/dL (ref 70–99)
Potassium: 4.4 mmol/L (ref 3.5–5.1)
Potassium: 3.8 mmol/L (ref 3.5–5.1)
Sodium: 139 mmol/L (ref 135–145)
Sodium: 137 mmol/L (ref 135–145)

## 2021-11-24 LAB — GLUCOSE, CAPILLARY
Glucose-Capillary: 93 mg/dL (ref 70–99)
Glucose-Capillary: 97 mg/dL (ref 70–99)
Glucose-Capillary: 116 mg/dL — ABNORMAL HIGH (ref 70–99)
Glucose-Capillary: 126 mg/dL — ABNORMAL HIGH (ref 70–99)
Glucose-Capillary: 103 mg/dL — ABNORMAL HIGH (ref 70–99)
Glucose-Capillary: 84 mg/dL (ref 70–99)

## 2021-11-24 SURGERY — 25 GAUGE PARS PLANA VITRECTOMY WITH 20 GAUGE MVR PORT
Anesthesia: General | Site: Eye | Laterality: Right

## 2021-11-24 MED ORDER — DEXAMETHASONE SODIUM PHOSPHATE 10 MG/ML IJ SOLN
INTRAMUSCULAR | Status: AC
  Filled 2021-11-24: qty 1

## 2021-11-24 MED ORDER — ROCURONIUM BROMIDE 100 MG/10ML IV SOLN
INTRAVENOUS | Status: DC | PRN
  Administered 2021-11-24: 40 mg via INTRAVENOUS

## 2021-11-24 MED ORDER — LIDOCAINE HCL 2 % IJ SOLN
INTRAMUSCULAR | Status: AC
  Filled 2021-11-24: qty 20

## 2021-11-24 MED ORDER — CYCLOPENTOLATE HCL 1 % OP SOLN
1.0000 [drp] | OPHTHALMIC | Status: AC | PRN
  Administered 2021-11-24 (×3): 1 [drp] via OPHTHALMIC
  Filled 2021-11-24: qty 2

## 2021-11-24 MED ORDER — PHENYLEPHRINE 80 MCG/ML (10ML) SYRINGE FOR IV PUSH (FOR BLOOD PRESSURE SUPPORT)
PREFILLED_SYRINGE | INTRAVENOUS | Status: DC | PRN
  Administered 2021-11-24: 80 ug via INTRAVENOUS

## 2021-11-24 MED ORDER — LIDOCAINE HCL 2 % IJ SOLN
INTRAMUSCULAR | Status: DC | PRN
  Administered 2021-11-24: 5.5 mL via RETROBULBAR

## 2021-11-24 MED ORDER — LIDOCAINE 2% (20 MG/ML) 5 ML SYRINGE
INTRAMUSCULAR | Status: DC | PRN
  Administered 2021-11-24: 60 mg via INTRAVENOUS
  Administered 2021-11-24: 40 mg via INTRAVENOUS

## 2021-11-24 MED ORDER — NA CHONDROIT SULF-NA HYALURON 40-30 MG/ML IO SOSY
INTRAOCULAR | Status: AC
  Filled 2021-11-24: qty 0.5

## 2021-11-24 MED ORDER — BUPIVACAINE HCL (PF) 0.75 % IJ SOLN
INTRAMUSCULAR | Status: AC
  Filled 2021-11-24: qty 10

## 2021-11-24 MED ORDER — BSS IO SOLN
INTRAOCULAR | Status: AC
  Filled 2021-11-24: qty 15

## 2021-11-24 MED ORDER — BSS PLUS IO SOLN
INTRAOCULAR | Status: AC
  Filled 2021-11-24: qty 500

## 2021-11-24 MED ORDER — LIDOCAINE 2% (20 MG/ML) 5 ML SYRINGE
INTRAMUSCULAR | Status: AC
  Filled 2021-11-24: qty 5

## 2021-11-24 MED ORDER — CHLORHEXIDINE GLUCONATE 0.12 % MT SOLN
15.0000 mL | Freq: Once | OROMUCOSAL | Status: AC
  Administered 2021-11-24: 15 mL via OROMUCOSAL
  Filled 2021-11-24: qty 15

## 2021-11-24 MED ORDER — MIDAZOLAM HCL 2 MG/2ML IJ SOLN
INTRAMUSCULAR | Status: AC
  Filled 2021-11-24: qty 2

## 2021-11-24 MED ORDER — ONDANSETRON HCL 4 MG/2ML IJ SOLN
INTRAMUSCULAR | Status: DC | PRN
  Administered 2021-11-24: 4 mg via INTRAVENOUS

## 2021-11-24 MED ORDER — PHENYLEPHRINE HCL 2.5 % OP SOLN
1.0000 [drp] | OPHTHALMIC | Status: AC | PRN
  Administered 2021-11-24 (×3): 1 [drp] via OPHTHALMIC
  Filled 2021-11-24: qty 2

## 2021-11-24 MED ORDER — MIDAZOLAM HCL 2 MG/2ML IJ SOLN
INTRAMUSCULAR | Status: DC | PRN
  Administered 2021-11-24: 2 mg via INTRAVENOUS

## 2021-11-24 MED ORDER — EPINEPHRINE PF 1 MG/ML IJ SOLN
INTRAMUSCULAR | Status: AC
  Filled 2021-11-24: qty 1

## 2021-11-24 MED ORDER — DEXAMETHASONE SODIUM PHOSPHATE 10 MG/ML IJ SOLN
INTRAMUSCULAR | Status: DC | PRN
  Administered 2021-11-24: 5 mg via INTRAVENOUS

## 2021-11-24 MED ORDER — SODIUM HYALURONATE 10 MG/ML IO SOLUTION
PREFILLED_SYRINGE | INTRAOCULAR | Status: AC
  Filled 2021-11-24: qty 0.85

## 2021-11-24 MED ORDER — OFLOXACIN 0.3 % OP SOLN
1.0000 [drp] | OPHTHALMIC | Status: AC | PRN
  Administered 2021-11-24 (×3): 1 [drp] via OPHTHALMIC
  Filled 2021-11-24: qty 5

## 2021-11-24 MED ORDER — EPINEPHRINE PF 1 MG/ML IJ SOLN
INTRAOCULAR | Status: DC | PRN
  Administered 2021-11-24 (×2): 500 mL

## 2021-11-24 MED ORDER — DEXAMETHASONE SODIUM PHOSPHATE 10 MG/ML IJ SOLN
INTRAMUSCULAR | Status: DC | PRN
  Administered 2021-11-24: 10 mg

## 2021-11-24 MED ORDER — PROPOFOL 10 MG/ML IV BOLUS
INTRAVENOUS | Status: DC | PRN
  Administered 2021-11-24: 20 mg via INTRAVENOUS
  Administered 2021-11-24: 40 mg via INTRAVENOUS
  Administered 2021-11-24: 60 mg via INTRAVENOUS
  Administered 2021-11-24: 100 mg via INTRAVENOUS
  Administered 2021-11-24: 20 mg via INTRAVENOUS
  Administered 2021-11-24: 30 mg via INTRAVENOUS
  Administered 2021-11-24: 20 mg via INTRAVENOUS

## 2021-11-24 MED ORDER — TOBRAMYCIN-DEXAMETHASONE 0.3-0.1 % OP OINT
TOPICAL_OINTMENT | OPHTHALMIC | Status: AC
  Filled 2021-11-24: qty 3.5

## 2021-11-24 MED ORDER — PHENYLEPHRINE HCL 10 % OP SOLN
1.0000 [drp] | OPHTHALMIC | Status: DC | PRN
  Administered 2021-11-24: 1 [drp] via OPHTHALMIC
  Filled 2021-11-24: qty 5

## 2021-11-24 MED ORDER — ORAL CARE MOUTH RINSE
15.0000 mL | Freq: Once | OROMUCOSAL | Status: AC
Start: 2021-11-24 — End: 2021-11-24

## 2021-11-24 MED ORDER — PROPARACAINE HCL 0.5 % OP SOLN
1.0000 [drp] | OPHTHALMIC | Status: DC | PRN
  Administered 2021-11-24: 1 [drp] via OPHTHALMIC
  Filled 2021-11-24: qty 15

## 2021-11-24 MED ORDER — CEFAZOLIN SUBCONJUNCTIVAL INJECTION 100 MG/0.5 ML
100.0000 mg | INJECTION | SUBCONJUNCTIVAL | Status: DC
  Filled 2021-11-24: qty 5

## 2021-11-24 MED ORDER — EPHEDRINE SULFATE-NACL 50-0.9 MG/10ML-% IV SOSY
PREFILLED_SYRINGE | INTRAVENOUS | Status: DC | PRN
  Administered 2021-11-24 (×3): 5 mg via INTRAVENOUS

## 2021-11-24 MED ORDER — PROPOFOL 500 MG/50ML IV EMUL
INTRAVENOUS | Status: DC | PRN
  Administered 2021-11-24: 35 ug/kg/min via INTRAVENOUS

## 2021-11-24 MED ORDER — SODIUM HYALURONATE 10 MG/ML IO SOLUTION
PREFILLED_SYRINGE | INTRAOCULAR | Status: DC | PRN
  Administered 2021-11-24: .85 mL via INTRAOCULAR

## 2021-11-24 MED ORDER — CEFAZOLIN SUBCONJUNCTIVAL INJECTION 100 MG/0.5 ML
INJECTION | SUBCONJUNCTIVAL | Status: DC | PRN
  Administered 2021-11-24: 100 mg via SUBCONJUNCTIVAL

## 2021-11-24 MED ORDER — 0.9 % SODIUM CHLORIDE (POUR BTL) OPTIME
TOPICAL | Status: DC | PRN
  Administered 2021-11-24: 200 mL

## 2021-11-24 MED ORDER — HYALURONIDASE HUMAN 150 UNIT/ML IJ SOLN
INTRAMUSCULAR | Status: AC
  Filled 2021-11-24: qty 1

## 2021-11-24 MED ORDER — SUGAMMADEX SODIUM 200 MG/2ML IV SOLN
INTRAVENOUS | Status: DC | PRN
  Administered 2021-11-24: 200 mg via INTRAVENOUS

## 2021-11-24 MED ORDER — ATROPINE SULFATE 1 % OP SOLN
OPHTHALMIC | Status: AC
  Filled 2021-11-24: qty 5

## 2021-11-24 MED ORDER — BSS IO SOLN
INTRAOCULAR | Status: DC | PRN
  Administered 2021-11-24: 15 mL via INTRAOCULAR

## 2021-11-24 MED ORDER — NA CHONDROIT SULF-NA HYALURON 40-30 MG/ML IO SOSY
INTRAOCULAR | Status: DC | PRN
  Administered 2021-11-24 (×2): .5 mL via INTRAOCULAR

## 2021-11-24 MED ORDER — SODIUM CHLORIDE 0.9 % IV SOLN: INTRAVENOUS | Status: DC

## 2021-11-24 MED ORDER — DEXMEDETOMIDINE HCL IN NACL 80 MCG/20ML IV SOLN
INTRAVENOUS | Status: DC | PRN
  Administered 2021-11-24: 4 ug via BUCCAL
  Administered 2021-11-24 (×2): 8 ug via BUCCAL

## 2021-11-24 SURGICAL SUPPLY — 61 items
APL SWBSTK 6 STRL LF DISP (MISCELLANEOUS) ×2
APPLICATOR COTTON TIP 6 STRL (MISCELLANEOUS) ×1 IMPLANT
APPLICATOR COTTON TIP 6IN STRL (MISCELLANEOUS) ×2
BAG COUNTER SPONGE SURGICOUNT (BAG) ×1 IMPLANT
BAG SPNG CNTER NS LX DISP (BAG) ×1
BALL CTTN LRG ABS STRL LF (GAUZE/BANDAGES/DRESSINGS) ×3
BLADE KERATOME 2.75 (BLADE) IMPLANT
BLADE SUPER 15 ALCON (BLADE) IMPLANT
CANNULA VLV SOFT TIP 25G (OPHTHALMIC) ×1 IMPLANT
CANNULA VLV SOFT TIP 25GA (OPHTHALMIC) ×2 IMPLANT
CARTRIDGE C MONARCH III (MISCELLANEOUS) IMPLANT
COTTONBALL LRG STERILE PKG (GAUZE/BANDAGES/DRESSINGS) ×3 IMPLANT
COVER SURGICAL LIGHT HANDLE (MISCELLANEOUS) ×1 IMPLANT
DRAPE HALF SHEET 40X57 (DRAPES) IMPLANT
DRAPE INCISE 51X51 W/FILM STRL (DRAPES) ×1 IMPLANT
DRAPE OPHTHALMIC 40X48 W POUCH (DRAPES) IMPLANT
DRAPE RETRACTOR (MISCELLANEOUS) ×1 IMPLANT
GLOVE SURG SYN 7.5  E (GLOVE) ×1
GLOVE SURG SYN 7.5 E (GLOVE) ×1 IMPLANT
GLOVE SURG SYN 7.5 PF PI (GLOVE) ×1 IMPLANT
GOWN STRL REUS W/ TWL LRG LVL3 (GOWN DISPOSABLE) ×2 IMPLANT
GOWN STRL REUS W/TWL LRG LVL3 (GOWN DISPOSABLE) ×2
KIT BASIN OR (CUSTOM PROCEDURE TRAY) ×1 IMPLANT
KIT IRRIGAT 0.9 MICROSMOOTH (MISCELLANEOUS) IMPLANT
KIT TURNOVER KIT B (KITS) IMPLANT
KNIFE GRIESHABER SHARP 2.5MM (MISCELLANEOUS) ×1 IMPLANT
LENS BIOM SUPER VIEW SET DISP (MISCELLANEOUS) ×1 IMPLANT
LENS IOL ACRSF IQ PC 22.5 (Intraocular Lens) IMPLANT
LENS IOL ACRYSOF IQ POST 22.5 (Intraocular Lens) ×1 IMPLANT
NDL 18GX1X1/2 (RX/OR ONLY) (NEEDLE) ×1 IMPLANT
NDL 25GX 5/8IN NON SAFETY (NEEDLE) ×1 IMPLANT
NDL 27GX1/2 REG BEVEL ECLIP (NEEDLE) ×1 IMPLANT
NDL FILTER BLUNT 18X1 1/2 (NEEDLE) ×1 IMPLANT
NDL HYPO 25GX1X1/2 BEV (NEEDLE) ×1 IMPLANT
NDL HYPO 30X.5 LL (NEEDLE) ×1 IMPLANT
NDL RETROBULBAR 25GX1.5 (NEEDLE) ×1 IMPLANT
NEEDLE 18GX1X1/2 (RX/OR ONLY) (NEEDLE) ×3 IMPLANT
NEEDLE 25GX 5/8IN NON SAFETY (NEEDLE) ×1 IMPLANT
NEEDLE 27GX1/2 REG BEVEL ECLIP (NEEDLE) ×1 IMPLANT
NEEDLE FILTER BLUNT 18X1 1/2 (NEEDLE) ×1 IMPLANT
NEEDLE HYPO 25GX1X1/2 BEV (NEEDLE) ×1 IMPLANT
NEEDLE HYPO 30X.5 LL (NEEDLE) ×1 IMPLANT
NEEDLE RETROBULBAR 25GX1.5 (NEEDLE) ×2 IMPLANT
NS IRRIG 1000ML POUR BTL (IV SOLUTION) ×1 IMPLANT
PACK CATARACT/VITRECTOMY 25GA (OPHTHALMIC) IMPLANT
PACK VITRECTOMY CUSTOM (CUSTOM PROCEDURE TRAY) ×1 IMPLANT
PAD ARMBOARD 7.5X6 YLW CONV (MISCELLANEOUS) ×2 IMPLANT
PAK PIK VITRECTOMY CVS 25GA (OPHTHALMIC) ×1 IMPLANT
PROBE LASER ILLUM FLEX CVD 25G (OPHTHALMIC) IMPLANT
SHEET MEDIUM DRAPE 40X70 STRL (DRAPES) ×1 IMPLANT
SOL ANTI FOG 6CC (MISCELLANEOUS) ×1 IMPLANT
SOLUTION ANTI FOG 6CC (MISCELLANEOUS) ×2
STRIP CLOSURE SKIN 1/2X4 (GAUZE/BANDAGES/DRESSINGS) IMPLANT
SUT ETHILON 10 0 CS140 6 (SUTURE) IMPLANT
SYR 10ML LL (SYRINGE) ×1 IMPLANT
SYR 5ML LL (SYRINGE) ×1 IMPLANT
SYR BULB EAR ULCER 3OZ GRN STR (SYRINGE) ×1 IMPLANT
SYR TB 1ML LUER SLIP (SYRINGE) IMPLANT
TIP ABS 45DEG FLARED 0.9MM (TIP) IMPLANT
TUBING HIGH PRESS EXTEN 6IN (TUBING) IMPLANT
WATER STERILE IRR 1000ML POUR (IV SOLUTION) ×1 IMPLANT

## 2021-11-24 NOTE — Brief Op Note (Signed)
11/24/2021  5:46 PM  PATIENT:  Jennifer Hogan  51 y.o. female  PRE-OPERATIVE DIAGNOSIS:  vitreous hemorrhage, cataract right eye  POST-OPERATIVE DIAGNOSIS:  vitreous hemorrhage, cataract right eye, dense posterior capsular scar right eye  PROCEDURE:  Procedure(s): 25 GAUGE PARS PLANA VITRECTOMY APPROACH RIGHT EYE, FLUID FLUID EXCHANGE (Right) ENDO LASER PAN PHOTOCOAGULATION (Right) CATARACT EXTRACTION AND INTRAOCULAR LENS PLACEMENT (IOC) RIGHT EYE (Right). Posterior capsulectomy (Right)  SURGEON:  Surgeon(s) and Role:    * Jalene Mullet, MD - Primary  PHYSICIAN ASSISTANT:   ASSISTANTS: none   ANESTHESIA:   local and general  EBL:  minimal   BLOOD ADMINISTERED:none  DRAINS: none   LOCAL MEDICATIONS USED:  MARCAINE    and LIDOCAINE   SPECIMEN:  No Specimen  DISPOSITION OF SPECIMEN:  N/A  COUNTS:  YES  TOURNIQUET:  * No tourniquets in log *  DICTATION: .Note written in EPIC  PLAN OF CARE: Discharge to home after PACU  PATIENT DISPOSITION:  PACU - hemodynamically stable.   Delay start of Pharmacological VTE agent (>24hrs) due to surgical blood loss or risk of bleeding: not applicable

## 2021-11-24 NOTE — H&P (Signed)
Date of examination:  11/24/21  Indication for surgery: vitreous hemorrhage and cataract right eye  Pertinent past medical history:  Past Medical History:  Diagnosis Date   Allergy    Anemia    Anxiety    Asthma    Cataract    CHF (congestive heart failure) ([place])    Coronary artery disease    a. s/p PTCA DES x3 in the LAD (ostial DES placed, mid DES placed, distal DES placed) and PTCA/DES x1 to intermediate branch 02/2017. b. ACS 02/08/18 with LCx stent placement.   Depression    Diabetes mellitus    type 1   DKA (diabetic ketoacidoses) 12/31/2017   Dyspnea    with exertion and dust, no oxygen   Elevated platelet count    GERD (gastroesophageal reflux disease)    Heart murmur    never caused any problems   High cholesterol    Hypothyroidism    Ischemic cardiomyopathy    a. EF low-normal with basal inferior akinesis, basal septal hypokinesis by echo 01/2018.   Myocardial infarction ([place]) 2019   Substance abuse ([place])    marijuana    Pertinent ocular history: Retinal detachment right eye  Pertinent family history:  Family History  Problem Relation Age of Onset   Cancer Mother        T cell lymphoma   Hypertension Mother    Hypercalcemia Mother    OCD Father    Anxiety disorder Father    Coronary artery disease Father    Congestive Heart Failure Father    Asthma Father    Hypercalcemia Father    Hypertension Father    Colon polyps Father    Depression Maternal Grandmother    Diabetes Paternal Grandmother    Stomach cancer Neg Hx    Esophageal cancer Neg Hx    Colon cancer Neg Hx    Rectal cancer Neg Hx     General:  Healthy appearing patient in no distress.    Eyes:    Acuity OD CF  External: Within normal limits      Anterior segment: Dense PSC cataract OD        Fundus: Vitreous hemorrhage right eye     Impression: Vitreous hemorrhage and cataract right eye   Plan:  Cataract extraction with intraocular lens implant and vitrectomy with endolaser right  eye  Jalene Mullet, MD

## 2021-11-24 NOTE — Progress Notes (Signed)
Patient on insulin pump - CBG 93 at 10:57 o'clock. Patient had at 09:17 o'clock Humalog 15 units per protocol. Patient is managing by herself the pump and she turned the pump to 70%. At 11:30 o'clock CBG 97. No periop Glycemic Control Protocol at this time per Dr. Lissa Hoard. Will continue to monitor.

## 2021-11-24 NOTE — Op Note (Signed)
Jennifer Hogan 11/24/2021 Diagnosis: Vitreous hemorrhage, cataract, and fibrotic posterior capsule right eye  Procedure: [place] Vitrectomy, Endolaser, and posterior capsulectomy, and cataract extraction with intraocular lens implant right eye Operative Eye:  right eye  Surgeon: Royston Cowper Estimated Blood Loss: minimal Specimens for Pathology:  None Complications: none   Time out confirmed the correct operative eye as the right eye.  Retrobulbar block was placed.  The  patient was prepped and draped in the usual fashion for ocular surgery on the  right eye .  A lid speculum was placed.  A clear cornea paracentesis was made at 2 o'clock.  Viscoelastic was placed in the eye.  A clear corneal would was placed at 12 o'clock.  A complete 360 degree curvilinear capsullorhexis was made with a cystotome.  The patient began to cough.  The sterile drapes and lid speculum were removed and general anesthesia was induced.  The pateint was reprepped and draped.  Surgery was resumed with hydration of the lens.  The phaco probe was used to divide and emulsify the lens.  A thick  posterior opaque plaque was noted.  The irrigation and aspiration (IA) cannula was used to remove cortical material.  The capsular bag was inflated with viscoelastic and a 22.5D SN60WF was placed in the capsular bag.  The lens was positioned and noted to be stable.  Viscoelastic was removed with the IA cannula.  The clear corneal would was closed with 10-0 nylon.  Attention was directed to the posterior portion of the procedure.  Infusion line and trocar was placed at the 8 o'clock position approximately 3.5 mm from the surgical limbus.   The infusion line was allowed to run and then clamped when placed at the cannula opening. The line was inserted and secured to the drape with an adhesive strip.   Active trocars/cannula were placed at the 10 and 2 o'clock positions approximately 3.5 mm from the surgical limbus. The cannula  was visualized in the vitreous cavity.  The light pipe and vitreous cutter were inserted into the vitreous cavity and a posterior capsulectomy was performed to remove the thick fibrotic plaque on the posterior capsule.  A core vitrectomy was then performed and vitreous hemorrhage was cleared from the eye.  Care taken to remove the vitreous up to the vitreous base for 360 degrees.   3 rows of endolaser were applied 360 degrees to the periphery.  A partial air-fluid exchange was performed.  The superior cannulas were sequentially removed with concommitant tamponade using a cotton tipped applicator and noted to be air tight.  The infusion line and trocar were removed and the sclerotomy was noted to be air tight with normal intraocular pressure by digital palpapation.  Subconjunctival injections of Ancef and Dexamethasone '4mg'$ /58m were placed in the infero-medial quadrant.   The speculum and drapes were removed and the eye was patched with Polymixin/Bacitracin ophthalmic ointment. An eye shield was placed and the patient was transferred alert and conversant with stable vital signs to the post operative recovery area.  The patient tolerated the procedure well and no complications were noted.  NRoyston CowperMD

## 2021-11-24 NOTE — Anesthesia Procedure Notes (Signed)
Procedure Name: MAC Date/Time: 11/24/2021 3:35 PM  Performed by: Rande Brunt, CRNAPre-anesthesia Checklist: Patient identified, Emergency Drugs available, Suction available and Patient being monitored Patient Re-evaluated:Patient Re-evaluated prior to induction Oxygen Delivery Method: Simple face mask Preoxygenation: Pre-oxygenation with 100% oxygen Induction Type: IV induction Placement Confirmation: positive ETCO2 and CO2 detector Dental Injury: Teeth and Oropharynx as per pre-operative assessment

## 2021-11-24 NOTE — Discharge Instructions (Addendum)
  SLEEP WITH HEAD ELEVATED,  KEEP PATCH IN PLACE.  CALL DR PATEL IF THROBBING PAIN, EYE PRESSURE

## 2021-11-24 NOTE — Transfer of Care (Signed)
Immediate Anesthesia Transfer of Care Note  Patient: Jennifer Hogan  Procedure(s) Performed: 25 GAUGE PARS PLANA VITRECTOMY APPROACH RIGHT EYE, FLUID FLUID EXCHANGE (Right: Eye) ENDO LASER PAN PHOTOCOAGULATION (Right: Eye) CATARACT EXTRACTION AND INTRAOCULAR LENS PLACEMENT (IOC) RIGHT EYE (Right: Eye)  Patient Location: PACU  Anesthesia Type:General  Level of Consciousness: awake, alert , and oriented  Airway & Oxygen Therapy: Patient Spontanous Breathing  Post-op Assessment: Report given to RN, Post -op Vital signs reviewed and stable, and Patient moving all extremities X 4  Post vital signs: Reviewed and stable  Last Vitals:  Vitals Value Taken Time  BP 128/65 11/24/21 1755  Temp    Pulse 78 11/24/21 1800  Resp 24 11/24/21 1800  SpO2 96 % 11/24/21 1800  Vitals shown include unvalidated device data.  Last Pain:  Vitals:   11/24/21 1107  TempSrc:   PainSc: 0-No pain         Complications: No notable events documented.

## 2021-11-24 NOTE — Anesthesia Procedure Notes (Signed)
Procedure Name: Intubation Date/Time: 11/24/2021 4:27 PM  Performed by: Rande Brunt, CRNAPre-anesthesia Checklist: Patient identified, Emergency Drugs available, Suction available and Patient being monitored Patient Re-evaluated:Patient Re-evaluated prior to induction Oxygen Delivery Method: Circle System Utilized Preoxygenation: Pre-oxygenation with 100% oxygen Induction Type: IV induction Ventilation: Mask ventilation without difficulty Laryngoscope Size: Mac and 3 Grade View: Grade II Tube type: Oral Tube size: 7.0 mm Number of attempts: 1 Airway Equipment and Method: Stylet and Oral airway Placement Confirmation: ETT inserted through vocal cords under direct vision, positive ETCO2 and breath sounds checked- equal and bilateral Secured at: 22 cm Tube secured with: Tape Dental Injury: Teeth and Oropharynx as per pre-operative assessment
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
## 2021-11-24 NOTE — Research (Signed)
ANALOG Informed Consent   Subject Name: Jennifer Hogan  Subject met inclusion and exclusion criteria.  The informed consent form, study requirements and expectations were reviewed with the subject and questions and concerns were addressed prior to the signing of the consent form.  The subject verbalized understanding of the trial requirements.  The subject agreed to participate in the ANALOG trial and signed the informed consent at 09:45 on 11-24-2021.  The informed consent was obtained prior to performance of any protocol-specific procedures for the subject.  A copy of the signed informed consent was given to the subject and a copy was placed in the subject's medical record.   [place] Myya Meenach, Research Coordinator  11/24/2021  09:48 a.m.

## 2021-11-25 ENCOUNTER — Telehealth (INDEPENDENT_AMBULATORY_CARE_PROVIDER_SITE_OTHER): Payer: No Payment, Other | Admitting: Psychiatry

## 2021-11-25 ENCOUNTER — Encounter (HOSPITAL_COMMUNITY): Payer: Self-pay | Admitting: Psychiatry

## 2021-11-25 ENCOUNTER — Other Ambulatory Visit: Payer: Self-pay

## 2021-11-25 DIAGNOSIS — F411 Generalized anxiety disorder: Secondary | ICD-10-CM | POA: Diagnosis not present

## 2021-11-25 DIAGNOSIS — F332 Major depressive disorder, recurrent severe without psychotic features: Secondary | ICD-10-CM

## 2021-11-25 MED ORDER — ERYTHROMYCIN 5 MG/GM OP OINT
TOPICAL_OINTMENT | Freq: Two times a day (BID) | OPHTHALMIC | 0 refills | Status: DC
  Filled 2021-11-25: qty 3.5, 14d supply, fill #0

## 2021-11-25 MED ORDER — DULOXETINE HCL 20 MG PO CPEP
20.0000 mg | ORAL_CAPSULE | Freq: Every day | ORAL | 1 refills | Status: DC
  Filled 2021-11-25: qty 30, 30d supply, fill #0
  Filled 2021-12-19: qty 30, 30d supply, fill #1

## 2021-11-25 MED ORDER — PREDNISOLONE ACETATE 1 % OP SUSP
OPHTHALMIC | 0 refills | Status: DC
  Filled 2021-11-25: qty 5, 34d supply, fill #0
  Filled 2022-01-26: qty 5, 28d supply, fill #1

## 2021-11-25 MED ORDER — OFLOXACIN 0.3 % OP SOLN
1.0000 [drp] | Freq: Four times a day (QID) | OPHTHALMIC | 0 refills | Status: DC
  Filled 2021-11-25: qty 5, 25d supply, fill #0

## 2021-11-25 MED ORDER — BUPROPION HCL ER (XL) 150 MG PO TB24
150.0000 mg | ORAL_TABLET | ORAL | 1 refills | Status: DC
  Filled 2021-11-25 – 2021-12-02 (×2): qty 30, 30d supply, fill #0

## 2021-11-25 NOTE — Progress Notes (Signed)
MD [facility] MD Outpatient Progress Note  11/25/2021 11:57 AM Jennifer Hogan  MRN:  [id number]  Assessment:  Jennifer Hogan presents for follow-up evaluation. Today, 11/25/21, patient reports improvement in irritability and rage episodes upon reduction of Wellbutrin back to 150 mg daily.  However she reports worsening depressive symptoms in the setting of ongoing medical stressors and acute job stress.  Endorses occasional moments of active SI but denies planning/desire/intent at this time and identified deterrent and protective factors to harming herself.  Due to impact chronic pain and neuropathy have on mood symptoms, patient was amenable to starting Cymbalta to target mood, anxiety, and pain symptoms at this time.  Will plan to continue Wellbutrin as below.  Patient has recently established for individual psychotherapy.  Plan to return to care in 5 weeks.  Identifying Information: Jennifer Hogan is a 51 y.o. y.o. female with a history of MDD, GAD, childhood trauma, T1DM with diabetic retinopathy and on insulin pump, CHF Stage 3, CAD s/p CABG 11/2018, and hypothyroidism who is an established patient with [name] participating in follow-up via video conferencing. Patient was recently discharged from [name] partial hospitalization program (participated 08/25/21-09/20/21) for management of worsening anxiety and depression. During that time, she participated in therapy and started on Wellbutrin, noting significant benefit from [name] program for mood and anxiety and demonstrating substantial insight on exam to contributors to mental health symptoms.   Plan:  # Major depressive disorder  Generalized anxiety disorder Past medication trials: Prozac 40 mg daily (ineffective), Effexor XR Status of problem: acute exacerbation Interventions: -- START Cymbalta 20 mg daily  -- Risks, benefits, and side effects including but not limited to headache, GI upset, sleep disturbance, and  sexual side effects were reviewed with informed consent provided -- Continue Wellbutrin XL 150 mg daily (decreased since last visit due to significant irritability at 300 mg dose) -- Encouraged to use Atarax 25 mg TID PRN panic attacks (has been using infrequently)  -- Continue individual psychotherapy with [name], [name] -- Patient has not yet started attending group therapy sessions through [name] but expresses desire to do so   # Cannabis use Status of problem: Improving Interventions: -- Continue to monitor use and provide psychoeducation   # History of alcohol use Past medication trials: None Status of problem: In remission Interventions: -- Patient reports social use but denies risky or escalating drinking behaviors; continue to monitor and support sobriety  Patient was given contact information for behavioral health clinic and was instructed to call 911 for emergencies.   Subjective:  Chief Complaint:  Chief Complaint  Patient presents with   Medication Management    Interval History:   Patient reports she had surgery on her eye yesterday to remove cataract and still recovering. Reports she has felt a "huge relief" since decreasing WBT - no longer feeling as irritable or the rage episodes she had been experiencing previously. Reports there have been some days in which she feels sad when reflecting on her past and feels that trauma can catch her at the strangest times. When thinking about not having resolution with her mom, feels sad and this leads to thoughts of "I don't want to be alive - is it worth making it better?" Endorses feeling exhausted related to physical issues. Denies suicide planning or desire/intent at the time. Identifies fear of pain and worsening physical health as deterrents as well as love for pets and horses as protective factors. However, does note these  protective factors have been harder to hold onto and had fleeting thoughts of rehoming  her horses although denies any steps taken to act on this.  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 Has started therapy; has not looked into [name] groups yet due to working.   She feels that Wellbutrin has been helpful although was unable to tolerate higher dose. Amenable to initiation of additional agent - endorses aching pains and diabetic neuropathy; denies prior trial of Cymbalta and amenable to starting.  All questions/concerns addressed.  Visit Diagnosis:    ICD-10-CM   1. GAD (generalized anxiety disorder)  F41.1     2. 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 Attended a mandated 2 week outpatient recovery program.   Past Medical History:  Past Medical History:  Diagnosis Date   Allergy    Anemia    Anxiety    Asthma    Cataract    CHF (congestive heart failure) ([place])    Coronary artery disease    a. s/p PTCA DES x3 in the LAD (ostial DES placed, mid DES placed, distal DES placed) and PTCA/DES x1 to intermediate branch 02/2017. b. ACS 02/08/18 with LCx stent placement.   Depression    Diabetes mellitus    type 1   DKA (diabetic ketoacidoses) 12/31/2017   Dyspnea    with exertion and dust, no oxygen   Elevated platelet count    GERD (gastroesophageal reflux disease)     Heart murmur    never caused any problems   High cholesterol    Hypothyroidism    Ischemic cardiomyopathy    a. EF low-normal with basal inferior akinesis, basal septal hypokinesis by echo 01/2018.   Myocardial infarction [facility]) 2019   Substance abuse ([place])    marijuana    Past Surgical History:  Procedure Laterality Date   APPENDECTOMY     BIOPSY  09/29/2021   Procedure: BIOPSY;  Surgeon: Yetta Flock, MD;  Location: [facility];  Service: Gastroenterology;;   COLONOSCOPY     COLONOSCOPY WITH PROPOFOL N/A 09/29/2021   Procedure: COLONOSCOPY WITH PROPOFOL;  Surgeon: Yetta Flock, MD;  [facility];  Service: Gastroenterology;  Laterality: N/A;   CORONARY ARTERY BYPASS GRAFT N/A 11/04/2018   Procedure: CORONARY ARTERY BYPASS GRAFTING (CABG), ON PUMP, TIMES THREE, USING LEFT AND RIGHT INTERNAL MAMMARY ARTERIES;  Surgeon: Wonda Olds, MD;  Location: [place];  Service: Open Heart Surgery;  Laterality: N/A;   CORONARY STENT INTERVENTION N/A 03/20/2017   Procedure: DES x 3 LAD, DES OM; Surgeon: Burnell Blanks, MD;  Location: [place] CV LAB;  Service: Cardiovascular;  Laterality: N/A;   CORONARY/GRAFT ACUTE MI REVASCULARIZATION N/A 02/08/2018   Procedure: Coronary/Graft Acute MI Revascularization;  Surgeon: Belva Crome, MD;  Location: [facility]  INVASIVE CV LAB;  Service: Cardiovascular;  Laterality: N/A;   ESOPHAGOGASTRODUODENOSCOPY (EGD) WITH PROPOFOL N/A 09/29/2021   Procedure: ESOPHAGOGASTRODUODENOSCOPY (EGD) WITH PROPOFOL;  Surgeon: Yetta Flock, MD;  [facility];  Service: Gastroenterology;  Laterality: N/A;   GAS INSERTION Right 04/28/2021   Procedure: INSERTION OF GAS ( C3F8);  Surgeon: Jalene Mullet, MD;  Location: [place];  Service: Ophthalmology;  Laterality: Right;  Right eye   GAS/FLUID EXCHANGE Right 04/28/2021   Procedure: GAS/FLUID EXCHANGE;  Surgeon: Jalene Mullet, MD;  Location: [place];  Service: Ophthalmology;  Laterality:  Right;  Right eye   LEFT HEART CATH AND CORONARY ANGIOGRAPHY N/A 03/20/2017   Procedure: LEFT HEART CATH AND CORONARY ANGIOGRAPHY;  Surgeon: Burnell Blanks, MD;  Location: [place] CV LAB;  Service: Cardiovascular;  Laterality: N/A;   LEFT HEART CATH AND CORONARY ANGIOGRAPHY N/A 02/08/2018   Procedure: LEFT HEART CATH AND CORONARY ANGIOGRAPHY;  Surgeon: Belva Crome, MD;  Location: [place] CV LAB;  Service: Cardiovascular;  Laterality: N/A;   LEFT HEART CATH AND CORONARY ANGIOGRAPHY N/A 10/29/2018   Procedure: LEFT HEART CATH AND CORONARY ANGIOGRAPHY;  Surgeon: Burnell Blanks, MD;  Location: [place] CV LAB;  Service: Cardiovascular;  Laterality: N/A;   LEFT HEART CATH AND CORS/GRAFTS ANGIOGRAPHY N/A 10/04/2020   Procedure: LEFT HEART CATH AND CORS/GRAFTS ANGIOGRAPHY;  Surgeon: Sherren Mocha, MD;  Location: [place] CV LAB;  Service: Cardiovascular;  Laterality: N/A;   LEFT HEART CATH AND CORS/GRAFTS ANGIOGRAPHY N/A 05/11/2021   Procedure: LEFT HEART CATH AND CORS/GRAFTS ANGIOGRAPHY;  Surgeon: Martinique, Peter M, MD;  Location: [place] CV LAB;  Service: Cardiovascular;  Laterality: N/A;   PARS PLANA VITRECTOMY Right 04/28/2021   Procedure: PARS PLANA VITRECTOMY WITH 25 GAUGE REMOVAL OF TRATIONAL MEMBRANE RIGHT EYE; DRAINAGE OF SUBRETINAL FLUID RIGHT EYE;  Surgeon: Jalene Mullet, MD;  Location: [place];  Service: Ophthalmology;  Laterality: Right;  Right eye   PHOTOCOAGULATION WITH LASER Right 04/28/2021   Procedure: PHOTOCOAGULATION WITH LASER;  Surgeon: Jalene Mullet, MD;  Location: [place];  Service: Ophthalmology;  Laterality: Right;  Right eye   POLYPECTOMY  09/29/2021   Procedure: POLYPECTOMY;  Surgeon: Yetta Flock, MD;  [facility];  Service: Gastroenterology;;   TEE WITHOUT CARDIOVERSION N/A 11/04/2018   Procedure: TRANSESOPHAGEAL ECHOCARDIOGRAM (TEE);  Surgeon: Wonda Olds, MD;  Location: [place];  Service: Open Heart Surgery;   Laterality: N/A;    Family Psychiatric History: Father: etoh use disorder, anxiety Grandmother: depression, was treated with ECT later in her life  Family History:  Family History  Problem Relation Age of Onset   Cancer Mother        T cell lymphoma   Hypertension Mother    Hypercalcemia Mother    OCD Father    Anxiety disorder Father    Coronary artery disease Father    Congestive Heart Failure Father    Asthma Father    Hypercalcemia Father    Hypertension Father    Colon polyps Father    Depression Maternal Grandmother    Diabetes Paternal Grandmother    Stomach cancer Neg Hx    Esophageal cancer Neg Hx    Colon cancer Neg Hx    Rectal cancer Neg Hx     Social History:  Social History   Socioeconomic History   Marital status: Divorced    Spouse name: Not on file   Number of children: 0   Years of education: Not on file  Highest education level: Professional school degree (e.g., MD, DDS, DVM, JD)  Occupational History   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"   Occupation: adjunct facilty at [employer]    Comment: Chemistry professor  Tobacco Use   Smoking status: Former    Types: Cigarettes   Smokeless tobacco: Never   Tobacco comments:    Smoked cigarettes in her 20's for 1 year  Vaping Use   Vaping Use: Never used  Substance and Sexual Activity   Alcohol use: Not Currently    Comment: Socially   Drug use: Yes    Types: Marijuana    Comment: a few times weekly, last dose 11/22/21   Sexual activity: Not Currently    Birth control/protection: None  Other Topics Concern   Not on file  Social History Narrative   Not on file   Social Determinants of Health   Financial Resource Strain: High Risk (10/05/2020)   Overall Financial Resource Strain (CARDIA)    Difficulty of Paying Living Expenses: Very hard  Food Insecurity: Food Insecurity Present (10/05/2020)   Hunger Vital Sign    Worried About [place] in the Last Year: Sometimes true    Ran  Out of Food in the Last Year: Sometimes true  Transportation Needs: No Transportation Needs (10/05/2020)   PRAPARE - Hydrologist (Medical): No    Lack of Transportation (Non-Medical): No  Physical Activity: Not on file  Stress: Not on file  Social Connections: Unknown (12/07/2017)   Social Connection and Isolation Panel [NHANES]    Frequency of Communication with Friends and Family: Patient refused    Frequency of Social Gatherings with Friends and Family: Patient refused    Attends Religious Services: Patient refused    Active Member of Clubs or Organizations: Patient refused    Attends Archivist Meetings: Patient refused    Marital Status: Patient refused    Allergies:  Allergies  Allergen Reactions   Atorvastatin Other (See Comments)    Myalgias    Crestor [Rosuvastatin Calcium] Other (See Comments)    Severe myalgias and joint pain. Has tolerated atorvastatin though   Monosodium Glutamate Nausea And Vomiting and Other (See Comments)    MSG - migraine   Zetia [Ezetimibe] Other (See Comments)    myalgias   Ciprofloxin Hcl [Ciprofloxacin] Nausea And Vomiting and Other (See Comments)    Severe migraine   Food Color Red [Red Dye] Diarrhea    Current Medications: Current Outpatient Medications  Medication Sig Dispense Refill   DULoxetine (CYMBALTA) 20 MG capsule Take 1 capsule (20 mg total) by mouth daily. 30 capsule 1   acetaminophen (TYLENOL) 325 MG tablet Take 650 mg by mouth daily as needed for mild pain, fever or headache.     albuterol (VENTOLIN HFA) 108 (90 Base) MCG/ACT inhaler INHALE 2 PUFFS INTO THE LUNGS EVERY 6 (SIX) HOURS AS NEEDED FOR WHEEZING OR SHORTNESS OF BREATH. 18 g 2   Alirocumab (PRALUENT) 150 MG/ML SOAJ Inject 1 pen  into the skin every 14 (fourteen) days. 2 mL 11   aspirin 81 MG EC tablet Take 1 tablet (81 mg total) by mouth daily. 90 tablet 0   b complex vitamins tablet Take 1 tablet by mouth 3 (three) times a  week.     budesonide (PULMICORT) 0.25 MG/2ML nebulizer solution Take 0.25 mg by nebulization daily as needed (shortness of breath/wheezing).     buPROPion (WELLBUTRIN XL) 150 MG 24 hr tablet  Take 1 tablet (150 mg total) by mouth every morning. 30 tablet 1   clopidogrel (PLAVIX) 75 MG tablet TAKE 1 TABLET BY MOUTH DAILY WITH BREAKFAST. 90 tablet 3   diclofenac Sodium (VOLTAREN) 1 % GEL Apply 2-4 g topically 3 (three) times daily as needed (knee pain).     diphenhydrAMINE (BENADRYL) 25 MG tablet Take 37.5 mg by mouth at bedtime.     erythromycin ophthalmic ointment Apply 1 cm twice daily to right eye for 1 week. 3.5 g 0   fluticasone (FLONASE) 50 MCG/ACT nasal spray Place 1 spray into both nostrils at bedtime as needed for allergies.     furosemide (LASIX) 20 MG tablet Take 1 tablet (20 mg total) by mouth daily as needed (for weight gain). 15 tablet 3   Glucagon 3 MG/DOSE POWD Place 3 mg into the nose once as needed for up to 1 dose. 1 each 11   hydrOXYzine (ATARAX) 25 MG tablet Take 1 tablet (25 mg total) by mouth 3 (three) times daily as needed. 90 tablet 1   insulin lispro (HUMALOG) 100 UNIT/ML injection Inject 35 units into the skin daily via pump 10 mL 1   levothyroxine (SYNTHROID) 100 MCG tablet TAKE 1 TABLET (100 MCG TOTAL) BY MOUTH DAILY BEFORE BREAKFAST. 90 tablet 3   losartan (COZAAR) 25 MG tablet Take 0.5 tablets (12.5 mg total) by mouth daily. 45 tablet 3   metoCLOPramide (REGLAN) 5 MG tablet Take 1 tablet (5 mg total) by mouth every 12 (twelve) hours as needed for nausea or vomiting. 60 tablet 3   metoprolol succinate (TOPROL-XL) 25 MG 24 hr tablet Take 0.5 tablets (12.5 mg total) by mouth daily. 15 tablet 6   montelukast (SINGULAIR) 10 MG tablet Take 1 tablet (10 mg total) by mouth at bedtime. 90 tablet 2   Multiple Vitamin (MULTIVITAMIN WITH MINERALS) TABS tablet Take 1 tablet by mouth daily.     nitroGLYCERIN (NITROSTAT) 0.4 MG SL tablet Place 1 tablet (0.4 mg total) under the tongue  every 5 (five) minutes as needed for chest pain. 25 tablet 6   ofloxacin (OCUFLOX) 0.3 % ophthalmic solution Place 1 drop into the right eye 4 (four) times daily for 1 week. 5 mL 0   omeprazole (PRILOSEC OTC) 20 MG tablet Take 20 mg by mouth daily as needed (acid reflux).     OVER THE COUNTER MEDICATION Take 3 capsules by mouth daily before breakfast. [employer] Super Lysine Immune Support     prednisoLONE acetate (PRED FORTE) 1 % ophthalmic suspension Place 1 drop into the right eye 4 (four) times daily for 7 days, THEN 1 drop 3 (three) times daily for 7 days, THEN 1 drop 2 (two) times daily for 7 days, THEN 1 drop daily for 7 days. 10 mL 0   spironolactone (ALDACTONE) 25 MG tablet Take 12.5 mg by mouth daily.     No current facility-administered medications for this visit.    ROS: Endorses generalized physical aches/pains, diabetic neuropathic pain.   Objective:  Psychiatric Specialty Exam: Last menstrual period 10/13/2021.There is no height or weight on file to calculate BMI.  General Appearance: Casual and Fairly Groomed; eye redness and swelling s/p cataract surgery  Eye Contact:  Good  Speech:  Clear and Coherent and Normal Rate  Volume:  Normal  Mood:   "exhausted"  Affect:  Full Range and Euthymic and Engaged  - appropriately tearful at times  Thought Process:  Goal Directed and Linear  Orientation:  Full (Time,  Place, and Person)  Thought Content: Denies hallucinations and no overt delusional content on interview  Suicidal Thoughts:   Endorses intermittent active SI but denies planning/desire/intent  Homicidal Thoughts:  No  Memory:   Grossly intact  Judgment:  Good  Insight:  Good  Psychomotor Activity:  Normal  Concentration:  Concentration: Grossly intact to interview  Recall:  NA  Fund of Knowledge: Good  Language: Good  Akathisia:  NA  Handed:  n/a  AIMS (if indicated): not done  Assets:  Communication Skills Desire for Improvement Financial  Resources/Insurance Housing Resilience Talents/Skills Vocational/Educational  ADL's:  Intact  Cognition: WNL  Sleep:  Good   PE: General: sits comfortably in view of camera; no acute distress  Pulm: no increased work of breathing on room air  MSK: all extremity movements appear intact  Neuro: no focal neurological deficits observed  Gait & Station: unable to assess by video    Metabolic Disorder Labs: Lab Results  Component Value Date   HGBA1C 8.0 (A) 07/01/2021   MPG 197.25 10/01/2020   MPG 231.69 03/15/2019   No results found for: "PROLACTIN" Lab Results  Component Value Date   CHOL 298 (H) 08/15/2021   TRIG 111 08/15/2021   HDL 76 08/15/2021   CHOLHDL 3.9 08/15/2021   VLDL 13 05/11/2021   LDLCALC 203 (H) 08/15/2021   LDLCALC 167 (H) 05/11/2021   Lab Results  Component Value Date   TSH 2.505 05/15/2021   TSH 0.737 03/25/2021    Therapeutic Level Labs: No results found for: "LITHIUM" No results found for: "VALPROATE" No results found for: "CBMZ"  Screenings: GAD-7    Flowsheet Row Office Visit from 09/28/2021 in [place] Counselor from 08/24/2021 in [facility] Office Visit from 06/27/2021 in [place] from 05/22/2019 in [place] Office Visit from 03/19/2019 in [place]  Total GAD-7 Score '18 17 18 14 19      '$ PHQ2-9    Flowsheet Row Counselor from 11/24/2021 in [facility] Office Visit from 09/28/2021 in [place] Counselor from 09/13/2021 in [facility] Counselor from 09/05/2021 in [facility] Counselor from 08/25/2021 in [place]  PHQ-2 Total Score '6 3 3 3 6  '$ PHQ-9 Total Score '16 14 17 14 20      '$ Flowsheet Row Admission (Discharged)  from 11/24/2021 in [place] Most recent reading at 11/24/2021 11:09 AM Counselor from 11/24/2021 in [facility] Most recent reading at 11/24/2021  8:20 AM Admission (Discharged) from 09/29/2021 in [place] Most recent reading at 09/29/2021 11:53 AM  C-SSRS RISK CATEGORY No Risk No Risk No Risk       Collaboration of Care: Collaboration of Care: Medication Management AEB active medication changes, Psychiatrist AEB established with this provider, and Other provider involved in patient's care AEB patient established with individual psychotherapy  Patient/Guardian was advised Release of Information must be obtained prior to any record release in order to collaborate their care with an outside provider. Patient/Guardian was advised if they have not already done so to contact the registration department to sign all necessary forms in order for [name] to release information regarding their care.   Consent: Patient/Guardian gives verbal consent for treatment and assignment of benefits for services provided during this  visit. Patient/Guardian expressed understanding and agreed to proceed.   Televisit via video: I connected with patient on 11/25/21 at 11:00 AM EDT by a video enabled telemedicine application and verified that I am speaking with the correct person using two identifiers.  Location: Patient: home in [place] Provider: remote office in    I discussed the limitations of evaluation and management by telemedicine and the availability of in person appointments. The patient expressed understanding and agreed to proceed.  I discussed the assessment and treatment plan with the patient. The patient was provided an opportunity to ask questions and all were answered. The patient agreed with the plan and demonstrated an understanding of the instructions.   The patient was advised to call back or seek an in-person evaluation if the  symptoms worsen or if the condition fails to improve as anticipated.  I provided 45 minutes of non-face-to-face time during this encounter.  Alva Kuenzel A  11/25/2021, 11:57 AM

## 2021-11-25 NOTE — Patient Instructions (Signed)
Thank you for attending your appointment today.  -- START Cymbalta 20 mg daily -- Continue other medications as prescribed.  Please do not make any changes to medications without first discussing with your provider. If you are experiencing a psychiatric emergency, please call 911 or present to your nearest emergency department. Additional crisis, medication management, and therapy resources are included below.  [facility]  [address] [place], [place] [address] [phone] WALK-IN URGENT CARE 24/7 FOR ANYONE [address] [place], [place] Fax: [phone] [email or web address] *Interpreters available *Accepts all insurance and uninsured for Urgent Care needs *Accepts Medicaid and uninsured for outpatient treatment (below)      ONLY FOR [facility]  Below:    Outpatient New Patient Assessment/Therapy Walk-ins:        Monday -Thursday 8am until slots are full.        Every Friday 1pm-4pm  (first come, first served)                   New Patient Psychiatry/Medication Management        Monday-Friday 8am-11am (first come, first served)               For all walk-ins we ask that you arrive by 7:15am, because patients will be seen in the order of arrival.

## 2021-11-25 NOTE — Anesthesia Postprocedure Evaluation (Signed)
Anesthesia Post Note  Patient: Jennifer Hogan  Procedure(s) Performed: 25 GAUGE PARS PLANA VITRECTOMY APPROACH RIGHT EYE, FLUID FLUID EXCHANGE (Right: Eye) ENDO LASER PAN PHOTOCOAGULATION (Right: Eye) CATARACT EXTRACTION AND INTRAOCULAR LENS PLACEMENT (IOC) RIGHT EYE (Right: Eye)     Patient location during evaluation: PACU Anesthesia Type: General Level of consciousness: sedated and patient cooperative Pain management: pain level controlled Vital Signs Assessment: post-procedure vital signs reviewed and stable Respiratory status: spontaneous breathing Cardiovascular status: stable Anesthetic complications: no   No notable events documented.  Last Vitals:  Vitals:   11/24/21 1815 11/24/21 1822  BP: 133/78 133/77  Pulse: 78 77  Resp: 13 17  Temp:  36.7 C  SpO2: 98% 100%    Last Pain:  Vitals:   11/24/21 1822  TempSrc:   PainSc: 0-No pain   Pain Goal:                   Nolon Nations

## 2021-11-28 ENCOUNTER — Other Ambulatory Visit (HOSPITAL_COMMUNITY)
Admission: RE | Admit: 2021-11-28 | Discharge: 2021-11-28 | Disposition: A | Discharge status: Home / Self Care | Payer: Medicare Other | Source: Ambulatory Visit | Attending: Medical | Admitting: Medical

## 2021-11-28 ENCOUNTER — Ambulatory Visit (INDEPENDENT_AMBULATORY_CARE_PROVIDER_SITE_OTHER): Payer: Self-pay | Admitting: Obstetrics and Gynecology

## 2021-11-28 ENCOUNTER — Encounter: Payer: Self-pay | Admitting: Obstetrics and Gynecology

## 2021-11-28 VITALS — BP 114/72 | HR 81 | Ht 65.0 in | Wt 145.9 lb

## 2021-11-28 DIAGNOSIS — Z01419 Encounter for gynecological examination (general) (routine) without abnormal findings: Secondary | ICD-10-CM | POA: Insufficient documentation

## 2021-11-28 DIAGNOSIS — Z1331 Encounter for screening for depression: Secondary | ICD-10-CM

## 2021-11-28 DIAGNOSIS — Z23 Encounter for immunization: Secondary | ICD-10-CM | POA: Diagnosis not present

## 2021-11-28 DIAGNOSIS — Z1151 Encounter for screening for human papillomavirus (HPV): Secondary | ICD-10-CM | POA: Insufficient documentation

## 2021-11-28 LAB — POCT PREGNANCY, URINE: Preg Test, Ur: NEGATIVE

## 2021-11-28 NOTE — Progress Notes (Signed)
NEW GYNECOLOGY PATIENT Patient name: Jennifer Hogan MRN [id number]  Date of birth: [birth year] Chief Complaint:   Gynecologic Exam     History:  Jennifer Hogan is a 51 y.o. G0P0000 being seen today for pap smear and STI testing.    Pt presents with desire for pap test today. Not currently sexually active. Continues to have menses but hey are becoming few and far between and alternate b/n red bleeding and brown discharge. No pelvic complaints at this time. Reports prior use of lng-IUD for contraception. Continues to have premenstrual mood swings.   Patient noted 1 (several days) on PHQ9 regarding thoughts of self-harm or wishing not be alive - reports actively engaged with psychiatrist and therapist to address mental health needs.    When original referral placed in new relationship and considering sexual intercourse, relationship ended and did not proceed with intercourse. Would like STI testing today to be sure of no current infection though no symptoms.      Review of Systems  All other systems reviewed and are negative.       Gynecologic History Patient's last menstrual period was 11/25/2021. Contraception: abstinence Last Pap: unk.  Last Mammogram: unk Last Colonoscopy: 09/2021.  Result was normal  Obstetric History OB History  Gravida Para Term Preterm AB Living  0 0 0 0 0 0  SAB IAB Ectopic Multiple Live Births  0 0 0 0 0    Past Medical History:  Diagnosis Date   Allergy    Anemia    Anxiety    Asthma    Cataract    CHF (congestive heart failure) (HCC)    Coronary artery disease    a. s/p PTCA DES x3 in the LAD (ostial DES placed, mid DES placed, distal DES placed) and PTCA/DES x1 to intermediate branch 02/2017. b. ACS 02/08/18 with LCx stent placement.   Depression    Diabetes mellitus    type 1   DKA (diabetic ketoacidoses) 12/31/2017   Dyspnea    with exertion and dust, no oxygen   Elevated platelet count    GERD (gastroesophageal reflux disease)     Heart murmur    never caused any problems   High cholesterol    Hypothyroidism    Ischemic cardiomyopathy    a. EF low-normal with basal inferior akinesis, basal septal hypokinesis by echo 01/2018.   Myocardial infarction ([place]) 2019   Substance abuse ([place])    marijuana    Past Surgical History:  Procedure Laterality Date   25 GAUGE PARS PLANA VITRECTOMY WITH 20 GAUGE MVR PORT Right 11/24/2021   Procedure: 25 GAUGE PARS PLANA VITRECTOMY APPROACH RIGHT EYE, FLUID FLUID EXCHANGE;  Surgeon: Jalene Mullet, MD;  Location: [facility];  Service: Ophthalmology;  Laterality: Right;   APPENDECTOMY     BIOPSY  09/29/2021   Procedure: BIOPSY;  Surgeon: Yetta Flock, MD;  Location: [facility];  Service: Gastroenterology;;   CATARACT EXTRACTION W/PHACO Right 11/24/2021   Procedure: CATARACT EXTRACTION AND INTRAOCULAR LENS PLACEMENT ([place]) RIGHT EYE;  Surgeon: Jalene Mullet, MD;  Location: [facility];  Service: Ophthalmology;  Laterality: Right;   COLONOSCOPY     COLONOSCOPY WITH PROPOFOL N/A 09/29/2021   Procedure: COLONOSCOPY WITH PROPOFOL;  Surgeon: Yetta Flock, MD;  Location: [facility];  Service: Gastroenterology;  Laterality: N/A;   CORONARY ARTERY BYPASS GRAFT N/A 11/04/2018   Procedure: CORONARY ARTERY BYPASS GRAFTING (CABG), ON PUMP, TIMES THREE, USING LEFT AND RIGHT INTERNAL MAMMARY ARTERIES;  Surgeon: Fredrich Romans  Z, MD;  Location: [facility];  Service: Open Heart Surgery;  Laterality: N/A;   CORONARY STENT INTERVENTION N/A 03/20/2017   Procedure: DES x 3 LAD, DES OM; Surgeon: Burnell Blanks, MD;  Location: [facility];  Service: Cardiovascular;  Laterality: N/A;   CORONARY/GRAFT ACUTE MI REVASCULARIZATION N/A 02/08/2018   Procedure: Coronary/Graft Acute MI Revascularization;  Surgeon: Belva Crome, MD;  Location: [facility];  Service: Cardiovascular;  Laterality: N/A;   ESOPHAGOGASTRODUODENOSCOPY (EGD) WITH PROPOFOL N/A 09/29/2021   Procedure:  ESOPHAGOGASTRODUODENOSCOPY (EGD) WITH PROPOFOL;  Surgeon: Yetta Flock, MD;  [facility];  Service: Gastroenterology;  Laterality: N/A;   GAS INSERTION Right 04/28/2021   Procedure: INSERTION OF GAS ( C3F8);  Surgeon: Jalene Mullet, MD;  Location: [facility];  Service: Ophthalmology;  Laterality: Right;  Right eye   GAS/FLUID EXCHANGE Right 04/28/2021   Procedure: GAS/FLUID EXCHANGE;  Surgeon: Jalene Mullet, MD;  Location: [facility];  Service: Ophthalmology;  Laterality: Right;  Right eye   LASER PHOTO ABLATION Right 11/24/2021   Procedure: ENDO LASER PAN PHOTOCOAGULATION;  Surgeon: Jalene Mullet, MD;  Location: [facility];  Service: Ophthalmology;  Laterality: Right;   LEFT HEART CATH AND CORONARY ANGIOGRAPHY N/A 03/20/2017   Procedure: LEFT HEART CATH AND CORONARY ANGIOGRAPHY;  Surgeon: Burnell Blanks, MD;  Location: [facility];  Service: Cardiovascular;  Laterality: N/A;   LEFT HEART CATH AND CORONARY ANGIOGRAPHY N/A 02/08/2018   Procedure: LEFT HEART CATH AND CORONARY ANGIOGRAPHY;  Surgeon: Belva Crome, MD;  Location: [facility];  Service: Cardiovascular;  Laterality: N/A;   LEFT HEART CATH AND CORONARY ANGIOGRAPHY N/A 10/29/2018   Procedure: LEFT HEART CATH AND CORONARY ANGIOGRAPHY;  Surgeon: Burnell Blanks, MD;  Location: [facility];  Service: Cardiovascular;  Laterality: N/A;   LEFT HEART CATH AND CORS/GRAFTS ANGIOGRAPHY N/A 10/04/2020   Procedure: LEFT HEART CATH AND CORS/GRAFTS ANGIOGRAPHY;  Surgeon: Sherren Mocha, MD;  Location: [facility];  Service: Cardiovascular;  Laterality: N/A;   LEFT HEART CATH AND CORS/GRAFTS ANGIOGRAPHY N/A 05/11/2021   Procedure: LEFT HEART CATH AND CORS/GRAFTS ANGIOGRAPHY;  Surgeon: Martinique, Peter M, MD;  Location: [facility];  Service: Cardiovascular;  Laterality: N/A;   PARS PLANA VITRECTOMY Right 04/28/2021   Procedure: PARS PLANA VITRECTOMY WITH 25 GAUGE REMOVAL OF TRATIONAL MEMBRANE  RIGHT EYE; DRAINAGE OF SUBRETINAL FLUID RIGHT EYE;  Surgeon: Jalene Mullet, MD;  Location: [facility];  Service: Ophthalmology;  Laterality: Right;  Right eye   PHOTOCOAGULATION WITH LASER Right 04/28/2021   Procedure: PHOTOCOAGULATION WITH LASER;  Surgeon: Jalene Mullet, MD;  Location: [facility];  Service: Ophthalmology;  Laterality: Right;  Right eye   POLYPECTOMY  09/29/2021   Procedure: POLYPECTOMY;  Surgeon: Yetta Flock, MD;  Location: [facility];  Service: Gastroenterology;;   TEE WITHOUT CARDIOVERSION N/A 11/04/2018   Procedure: TRANSESOPHAGEAL ECHOCARDIOGRAM (TEE);  Surgeon: Wonda Olds, MD;  Location: [facility];  Service: Open Heart Surgery;  Laterality: N/A;    Current Outpatient Medications on File Prior to Visit  Medication Sig Dispense Refill   acetaminophen (TYLENOL) 325 MG tablet Take 650 mg by mouth daily as needed for mild pain, fever or headache.     albuterol (VENTOLIN HFA) 108 (90 Base) MCG/ACT inhaler INHALE 2 PUFFS INTO THE LUNGS EVERY 6 (SIX) HOURS AS NEEDED FOR WHEEZING OR SHORTNESS OF BREATH. 18 g 2   Alirocumab (PRALUENT) 150 MG/ML SOAJ Inject 1 pen  into the  skin every 14 (fourteen) days. 2 mL 11   aspirin 81 MG EC tablet Take 1 tablet (81 mg total) by mouth daily. 90 tablet 0   b complex vitamins tablet Take 1 tablet by mouth 3 (three) times a week.     budesonide (PULMICORT) 0.25 MG/2ML nebulizer solution Take 0.25 mg by nebulization daily as needed (shortness of breath/wheezing).     buPROPion (WELLBUTRIN XL) 150 MG 24 hr tablet Take 1 tablet (150 mg total) by mouth every morning. 30 tablet 1   clopidogrel (PLAVIX) 75 MG tablet TAKE 1 TABLET BY MOUTH DAILY WITH BREAKFAST. 90 tablet 3   diclofenac Sodium (VOLTAREN) 1 % GEL Apply 2-4 g topically 3 (three) times daily as needed (knee pain).     diphenhydrAMINE (BENADRYL) 25 MG tablet Take 37.5 mg by mouth at bedtime.     DULoxetine (CYMBALTA) 20 MG capsule Take 1 capsule (20 mg total) by mouth daily. 30 capsule 1    erythromycin ophthalmic ointment Apply 1 cm twice daily to right eye for 1 week. 3.5 g 0   fluticasone (FLONASE) 50 MCG/ACT nasal spray Place 1 spray into both nostrils at bedtime as needed for allergies.     furosemide (LASIX) 20 MG tablet Take 1 tablet (20 mg total) by mouth daily as needed (for weight gain). 15 tablet 3   Glucagon 3 MG/DOSE POWD Place 3 mg into the nose once as needed for up to 1 dose. 1 each 11   hydrOXYzine (ATARAX) 25 MG tablet Take 1 tablet (25 mg total) by mouth 3 (three) times daily as needed. 90 tablet 1   insulin lispro (HUMALOG) 100 UNIT/ML injection Inject 35 units into the skin daily via pump 10 mL 1   levothyroxine (SYNTHROID) 100 MCG tablet TAKE 1 TABLET (100 MCG TOTAL) BY MOUTH DAILY BEFORE BREAKFAST. 90 tablet 3   losartan (COZAAR) 25 MG tablet Take 0.5 tablets (12.5 mg total) by mouth daily. 45 tablet 3   metoCLOPramide (REGLAN) 5 MG tablet Take 1 tablet (5 mg total) by mouth every 12 (twelve) hours as needed for nausea or vomiting. 60 tablet 3   metoprolol succinate (TOPROL-XL) 25 MG 24 hr tablet Take 0.5 tablets (12.5 mg total) by mouth daily. 15 tablet 6   montelukast (SINGULAIR) 10 MG tablet Take 1 tablet (10 mg total) by mouth at bedtime. 90 tablet 2   Multiple Vitamin (MULTIVITAMIN WITH MINERALS) TABS tablet Take 1 tablet by mouth daily.     nitroGLYCERIN (NITROSTAT) 0.4 MG SL tablet Place 1 tablet (0.4 mg total) under the tongue every 5 (five) minutes as needed for chest pain. 25 tablet 6   ofloxacin (OCUFLOX) 0.3 % ophthalmic solution Place 1 drop into the right eye 4 (four) times daily for 1 week. 5 mL 0   omeprazole (PRILOSEC OTC) 20 MG tablet Take 20 mg by mouth daily as needed (acid reflux).     OVER THE COUNTER MEDICATION Take 3 capsules by mouth daily before breakfast. [employer] Super Lysine Immune Support     prednisoLONE acetate (PRED FORTE) 1 % ophthalmic suspension Place 1 drop into the right eye 4 (four) times daily for 7 days, THEN 1  drop 3 (three) times daily for 7 days, THEN 1 drop 2 (two) times daily for 7 days, THEN 1 drop daily for 7 days. 10 mL 0   spironolactone (ALDACTONE) 25 MG tablet Take 12.5 mg by mouth daily.     No current facility-administered medications on file prior to  visit.    Allergies  Allergen Reactions   Atorvastatin Other (See Comments)    Myalgias    Crestor [Rosuvastatin Calcium] Other (See Comments)    Severe myalgias and joint pain. Has tolerated atorvastatin though   Monosodium Glutamate Nausea And Vomiting and Other (See Comments)    MSG - migraine   Zetia [Ezetimibe] Other (See Comments)    myalgias   Ciprofloxin Hcl [Ciprofloxacin] Nausea And Vomiting and Other (See Comments)    Severe migraine   Food Color Red [Red Dye] Diarrhea    Social History:  reports that she has quit smoking. Her smoking use included cigarettes. She has never used smokeless tobacco. She reports that she does not currently use alcohol. She reports current drug use. Drug: Marijuana.  Family History  Problem Relation Age of Onset   Cancer Mother        T cell lymphoma   Hypertension Mother    Hypercalcemia Mother    OCD Father    Anxiety disorder Father    Coronary artery disease Father    Congestive Heart Failure Father    Asthma Father    Hypercalcemia Father    Hypertension Father    Colon polyps Father    Depression Maternal Grandmother    Diabetes Paternal Grandmother    Stomach cancer Neg Hx    Esophageal cancer Neg Hx    Colon cancer Neg Hx    Rectal cancer Neg Hx     The following portions of the patient's history were reviewed and updated as appropriate: allergies, current medications, past family history, past medical history, past social history, past surgical history and problem list.  Review of Systems Pertinent items noted in HPI and remainder of comprehensive ROS otherwise negative.  Physical Exam:  BP 114/72   Pulse 81   Ht '5\' 5"'$  (1.651 m)   Wt 145 lb 14.4 oz (66.2 kg)    LMP 11/25/2021   BMI 24.28 kg/m  Physical Exam Vitals and nursing note reviewed. Exam conducted with a chaperone present.  Constitutional:      Appearance: Normal appearance.  Cardiovascular:     Rate and Rhythm: Normal rate.  Pulmonary:     Effort: Pulmonary effort is normal.     Breath sounds: Normal breath sounds.  Genitourinary:    General: Normal vulva.     Exam position: Lithotomy position.     Vagina: Normal.     Cervix: Normal.     Comments: Small nevi and brown discharge Neurological:     General: No focal deficit present.     Mental Status: She is alert and oriented to person, place, and time.  Psychiatric:        Mood and Affect: Mood normal.        Behavior: Behavior normal.        Thought Content: Thought content normal.        Judgment: Judgment normal.      Assessment and Plan:   1. Need for immunization against influenza - Flu Vaccine QUAD 53moIM (Fluarix, Fluzone & Alfiuria Quad PF)  2. Well woman exam with routine gynecological exam Routine pap collected as well as STI screening - Cytology - PAP( [facility]) - Hepatitis B surface antigen - Hepatitis C antibody - HIV Antibody (routine testing w rflx) - RPR - Cervicovaginal ancillary only  3. Depression screening Currently engaged with MH care and denies SI/HI today.    Routine preventative health maintenance measures emphasized. Please refer to After Visit  Summary for other counseling recommendations.      Darliss Cheney, MD Obstetrician & Gynecologist, [facility] [employer], [place]

## 2021-11-29 LAB — CYTOLOGY - PAP
Comment: NEGATIVE
Diagnosis: NEGATIVE
High risk HPV: NEGATIVE

## 2021-11-29 LAB — CERVICOVAGINAL ANCILLARY ONLY
Bacterial Vaginitis (gardnerella): NEGATIVE
Candida Glabrata: POSITIVE — AB
Candida Vaginitis: NEGATIVE
Chlamydia: NEGATIVE
Comment: NEGATIVE
Comment: NEGATIVE
Comment: NEGATIVE
Comment: NEGATIVE
Comment: NEGATIVE
Comment: NORMAL
Neisseria Gonorrhea: NEGATIVE
Trichomonas: NEGATIVE

## 2021-11-29 LAB — HEPATITIS B SURFACE ANTIGEN: Hepatitis B Surface Ag: NEGATIVE

## 2021-11-29 LAB — HIV ANTIBODY (ROUTINE TESTING W REFLEX): HIV Screen 4th Generation wRfx: NONREACTIVE

## 2021-11-29 LAB — HEPATITIS C ANTIBODY: Hep C Virus Ab: NONREACTIVE

## 2021-11-29 LAB — RPR: RPR Ser Ql: NONREACTIVE

## 2021-11-30 ENCOUNTER — Other Ambulatory Visit: Payer: Self-pay | Admitting: Obstetrics and Gynecology

## 2021-11-30 DIAGNOSIS — B379 Candidiasis, unspecified: Secondary | ICD-10-CM

## 2021-11-30 MED ORDER — NYSTATIN 100000 UNIT/GM EX CREA
TOPICAL_CREAM | CUTANEOUS | 0 refills | Status: DC
  Filled 2021-11-30: qty 15, fill #0

## 2021-12-01 ENCOUNTER — Other Ambulatory Visit: Payer: Self-pay

## 2021-12-01 MED ORDER — BORIC ACID CRYS
600.0000 mg | CRYSTALS | Freq: Every day | 2 refills | Status: AC
  Filled 2021-12-01: qty 20, fill #0

## 2021-12-01 NOTE — Addendum Note (Signed)
Addended by: Cindi Carbon on: 12/01/2021 08:17 AM   Modules accepted: Orders

## 2021-12-02 ENCOUNTER — Other Ambulatory Visit: Payer: Self-pay

## 2021-12-02 ENCOUNTER — Encounter (HOSPITAL_COMMUNITY)
Admission: RE | Admit: 2021-12-02 | Discharge: 2021-12-02 | Disposition: A | Discharge status: Home / Self Care | Payer: Medicare Other | Source: Ambulatory Visit | Attending: Gastroenterology | Admitting: Gastroenterology

## 2021-12-02 DIAGNOSIS — R112 Nausea with vomiting, unspecified: Secondary | ICD-10-CM | POA: Diagnosis present

## 2021-12-02 DIAGNOSIS — R1013 Epigastric pain: Secondary | ICD-10-CM | POA: Insufficient documentation

## 2021-12-02 MED ORDER — TECHNETIUM TC 99M SULFUR COLLOID
2.0000 | Freq: Once | INTRAVENOUS | Status: AC
  Administered 2021-12-02: 2 via INTRAVENOUS

## 2021-12-03 ENCOUNTER — Other Ambulatory Visit: Payer: Self-pay | Admitting: Cardiology

## 2021-12-05 ENCOUNTER — Other Ambulatory Visit: Payer: Self-pay

## 2021-12-05 MED ORDER — CLOPIDOGREL BISULFATE 75 MG PO TABS
75.0000 mg | ORAL_TABLET | Freq: Every day | ORAL | 2 refills | Status: DC
  Filled 2021-12-05: qty 90, 90d supply, fill #0
  Filled 2022-03-03: qty 90, 90d supply, fill #1

## 2021-12-06 ENCOUNTER — Other Ambulatory Visit: Payer: Self-pay

## 2021-12-08 ENCOUNTER — Encounter (HOSPITAL_COMMUNITY): Payer: Self-pay

## 2021-12-08 ENCOUNTER — Ambulatory Visit (HOSPITAL_COMMUNITY): Payer: Medicaid Other | Admitting: Licensed Clinical Social Worker

## 2021-12-19 ENCOUNTER — Other Ambulatory Visit (HOSPITAL_COMMUNITY): Payer: Self-pay

## 2021-12-19 ENCOUNTER — Other Ambulatory Visit (HOSPITAL_COMMUNITY): Payer: Self-pay | Admitting: Internal Medicine

## 2021-12-19 ENCOUNTER — Other Ambulatory Visit: Payer: Self-pay

## 2021-12-19 ENCOUNTER — Other Ambulatory Visit: Payer: Self-pay | Admitting: Endocrinology

## 2021-12-19 ENCOUNTER — Other Ambulatory Visit (HOSPITAL_BASED_OUTPATIENT_CLINIC_OR_DEPARTMENT_OTHER): Payer: Self-pay

## 2021-12-19 DIAGNOSIS — E119 Type 2 diabetes mellitus without complications: Secondary | ICD-10-CM

## 2021-12-19 MED ORDER — METOPROLOL SUCCINATE ER 25 MG PO TB24
12.5000 mg | ORAL_TABLET | Freq: Every day | ORAL | 6 refills | Status: DC
  Filled 2021-12-19: qty 15, 30d supply, fill #0

## 2021-12-19 MED ORDER — INSULIN LISPRO 100 UNIT/ML IJ SOLN
35.0000 [IU] | Freq: Every day | INTRAMUSCULAR | 1 refills | Status: DC
  Filled 2021-12-19: qty 10, 28d supply, fill #0
  Filled 2022-01-23 – 2022-01-24 (×2): qty 10, 28d supply, fill #1

## 2021-12-20 ENCOUNTER — Other Ambulatory Visit (HOSPITAL_COMMUNITY): Payer: Self-pay

## 2021-12-23 ENCOUNTER — Ambulatory Visit (INDEPENDENT_AMBULATORY_CARE_PROVIDER_SITE_OTHER): Payer: Medicaid Other | Admitting: Gastroenterology

## 2021-12-23 ENCOUNTER — Other Ambulatory Visit: Payer: Self-pay

## 2021-12-23 ENCOUNTER — Encounter: Payer: Self-pay | Admitting: Gastroenterology

## 2021-12-23 VITALS — BP 102/68 | HR 95 | Ht 65.0 in | Wt 151.6 lb

## 2021-12-23 DIAGNOSIS — K31A0 Gastric intestinal metaplasia, unspecified: Secondary | ICD-10-CM

## 2021-12-23 DIAGNOSIS — R112 Nausea with vomiting, unspecified: Secondary | ICD-10-CM | POA: Diagnosis not present

## 2021-12-23 DIAGNOSIS — F1290 Cannabis use, unspecified, uncomplicated: Secondary | ICD-10-CM

## 2021-12-23 DIAGNOSIS — K3184 Gastroparesis: Secondary | ICD-10-CM

## 2021-12-23 DIAGNOSIS — R194 Change in bowel habit: Secondary | ICD-10-CM | POA: Diagnosis not present

## 2021-12-23 MED ORDER — METOCLOPRAMIDE HCL 5 MG PO TABS
5.0000 mg | ORAL_TABLET | Freq: Three times a day (TID) | ORAL | 3 refills | Status: DC | PRN
  Filled 2021-12-23: qty 60, 20d supply, fill #0
  Filled 2022-01-26: qty 60, 20d supply, fill #1

## 2021-12-23 MED ORDER — CITRUCEL PO POWD: 1.0000 | Freq: Every day | ORAL | Status: DC

## 2021-12-23 NOTE — Patient Instructions (Addendum)
If you are age 53 or older, your body mass index should be between 23-30. Your Body mass index is 25.23 kg/m. If this is out of the aforementioned range listed, please consider follow up with your Primary Care Provider.  If you are age 53 or younger, your body mass index should be between 19-25. Your Body mass index is 25.23 kg/m. If this is out of the aformentioned range listed, please consider follow up with your Primary Care Provider.   ________________________________________________________   We are giving you a handout today regarding Cannabinoid Hyperemesis Syndrome.  Please purchase the following medications over the counter and take as directed: Citrucel: Take once daily  We have sent the following medications to your pharmacy for you to pick up at your convenience: Reglan 5 mg: Take every 8 hours as needed  Please follow up in 6 months.  Thank you for entrusting me with your care and for choosing [facility], Dr. Earlham Cellar

## 2021-12-23 NOTE — Progress Notes (Signed)
HPI : 51 year old female with a history of type 1 diabetes, significant coronary artery disease (s/p MI 2019 s/p DES x 4  Feb. 2019, S/P DES x 23 Jan 2018 on Plavix, s/p 3 vessel CABG 10/2018, s/p multiple NSTEMIs as listed below), cardiomyopathy, history of DKA with intermittent episodes of nausea vomiting, here for follow-up visit.  She was last seen in the office in June, with EGD and colonoscopy most recently as below.  Recall from prior notes, she has had numerous hospitalizations between 2019 and most recently May 2023 for DKA, refractory nausea vomiting.  There has been high concern for gastroparesis over the years.  At baseline she has had bouts of intermittent nausea and vomiting, also with occasional diarrhea.  She states she feels that if she is not drinking enough water she can become dehydrated, gets in cycles of causing DKA/hyperglycemia and then she cannot control her nausea and vomiting.  She had been on some low-dose omeprazole as needed for heartburn and Reglan as needed when these episodes occur.  We performed an EGD and colonoscopy in the hospital setting for anesthesia support this September.  Her stomach looked okay grossly, no outlet obstruction.  Biopsies to rule out H. pylori showed no evidence of H. pylori but she did have gastric intestinal metaplasia on multiple biopsy specimens.  Her small bowel biopsies were normal.  She had 1 small colon polyp removed and then her colon was otherwise normal without any microscopic colitis.  She denies any family history of gastric cancer.  We subsequently referred her for gastric emptying study to more formally assess for gastroparesis and this was surprisingly normal.  Since have last seen her her GI tract has been doing relatively well.  She has not had any flares of nausea vomiting or hospitalization since this past May.  She has occasional nausea for which she will use her Reglan at onset and this usually aborts it helps prevent an  episode.  She is taking Reglan perhaps 5 mg once every 2 weeks and she tolerates it well.  Her appetite is not great, she tries to eat healthy foods, and smaller meals and seems to be getting by okay without any weight loss.  Her bowel habits are altered.  She can go back and forth between loose stools and constipated stools.  We had discussed trying her on a daily fiber supplement but she was unable to try that yet.  We did review her marijuana history.  She states she does smoke a lot of marijuana and had been doing it every day for the most part.  She did not feel there was a significant link between her marijuana use and her nausea and vomiting however she states in recent weeks when she is smoked it makes her joints hurt.  She has stopped using marijuana for the past 2 weeks and states she is feeling better.   She does have a history of depression and anxiety, managed currently with Cymbalta and Wellbutrin.  She does think she has been on Remeron remotely which caused some drowsiness.  She has not been on that recently.  This was a very long time ago.  Prior workup: EGD 09/29/21: Esophagogastric landmarks identified. - 1 cm hiatal hernia. - Z-line irregular but did not meet criteria for Barrett's. - A few gastric polyps. Representative sample resected and retrieved. - Normal stomach otherwise - no outlet obstruction - Normal examined duodenum. Biopsied. Will await biopsy results but suspect patient likely has underlying gastroparesis.  Will discuss options with her following pathology results.  Colonoscopy 09/29/21: - The examined portion of the ileum was normal. - One 5 mm polyp in the descending colon, removed with a cold snare. Resected and retrieved. - Internal hemorrhoids. - The examination was otherwise normal. - Biopsies were taken with a cold forceps from the right colon, left colon and transverse colon for evaluation of microscopic colitis. Will await pathology results. Quite  possible the patient has an underlying motility disorder in the setting of diabetes.   FINAL MICROSCOPIC DIAGNOSIS:   A. SMALL BOWEL, BIOPSY:  -  Duodenal mucosa within normal limits   B. STOMACH, POLYPECTOMY:  -  Hyperplastic polyp, negative for dysplasia.   C. STOMACH, BIOPSY:  -  Predominantly antral type mucosa with mild to moderate chronic  inactive gastritis and multifocal intestinal metaplasia (3 of 4  fragments) suggestive of atrophy.  -  An immunohistochemical stain for Helicobacter pylori organisms is  pending and will be reported in an addendum.   D. COLON, DESCENDING, POLYPECTOMY:  -  Tubular adenoma.   E. COLON, RANDOM, BIOPSY:  -  Colonic mucosa within normal limits.   ADDENDUM: An immunohistochemical stain for Helicobacter pylori organisms  is negative.    Gastric emptying study 12/02/2021: IMPRESSION: Normal gastric emptying study.  CT abdomen / pelvis without contrast 05/30/21: IMPRESSION: 1. The urinary bladder is distended to the level of the umbilicus. Correlate for urinary retention. 2. Otherwise, no acute abdominopelvic findings. 3. Aortic Atherosclerosis (ICD10-I70.0).    Past Medical History:  Diagnosis Date   Allergy    Anemia    Anxiety    Asthma    Cataract    CHF (congestive heart failure) (HCC)    Coronary artery disease    a. s/p PTCA DES x3 in the LAD (ostial DES placed, mid DES placed, distal DES placed) and PTCA/DES x1 to intermediate branch 02/2017. b. ACS 02/08/18 with LCx stent placement.   Depression    Diabetes mellitus    type 1   DKA (diabetic ketoacidoses) 12/31/2017   Dyspnea    with exertion and dust, no oxygen   Elevated platelet count    GERD (gastroesophageal reflux disease)    Heart murmur    never caused any problems   High cholesterol    Hypothyroidism    Ischemic cardiomyopathy    a. EF low-normal with basal inferior akinesis, basal septal hypokinesis by echo 01/2018.   Myocardial infarction ([place]) 2019    Substance abuse ([place])    marijuana     Past Surgical History:  Procedure Laterality Date   25 GAUGE PARS PLANA VITRECTOMY WITH 20 GAUGE MVR PORT Right 11/24/2021   Procedure: 25 GAUGE PARS PLANA VITRECTOMY APPROACH RIGHT EYE, FLUID FLUID EXCHANGE;  Surgeon: Jalene Mullet, MD;  Location: [place];  Service: Ophthalmology;  Laterality: Right;   APPENDECTOMY     BIOPSY  09/29/2021   Procedure: BIOPSY;  Surgeon: Yetta Flock, MD;  Location: [facility];  Service: Gastroenterology;;   CATARACT EXTRACTION W/PHACO Right 11/24/2021   Procedure: CATARACT EXTRACTION AND INTRAOCULAR LENS PLACEMENT ([place]) RIGHT EYE;  Surgeon: Jalene Mullet, MD;  Location: [facility];  Service: Ophthalmology;  Laterality: Right;   COLONOSCOPY     COLONOSCOPY WITH PROPOFOL N/A 09/29/2021   Procedure: COLONOSCOPY WITH PROPOFOL;  Surgeon: Yetta Flock, MD;  Location: [facility];  Service: Gastroenterology;  Laterality: N/A;   CORONARY ARTERY BYPASS GRAFT N/A 11/04/2018   Procedure: CORONARY ARTERY BYPASS GRAFTING (CABG), ON PUMP, TIMES  THREE, USING LEFT AND RIGHT INTERNAL MAMMARY ARTERIES;  Surgeon: Wonda Olds, MD;  Location: [facility];  Service: Open Heart Surgery;  Laterality: N/A;   CORONARY STENT INTERVENTION N/A 03/20/2017   Procedure: DES x 3 LAD, DES OM; Surgeon: Burnell Blanks, MD;  Location: [facility];  Service: Cardiovascular;  Laterality: N/A;   CORONARY/GRAFT ACUTE MI REVASCULARIZATION N/A 02/08/2018   Procedure: Coronary/Graft Acute MI Revascularization;  Surgeon: Belva Crome, MD;  Location: [place];  Service: Cardiovascular;  Laterality: N/A;   ESOPHAGOGASTRODUODENOSCOPY (EGD) WITH PROPOFOL N/A 09/29/2021   Procedure: ESOPHAGOGASTRODUODENOSCOPY (EGD) WITH PROPOFOL;  Surgeon: Yetta Flock, MD;  [facility];  Service: Gastroenterology;  Laterality: N/A;   GAS INSERTION Right 04/28/2021   Procedure: INSERTION OF GAS ( C3F8);  Surgeon: Jalene Mullet,  MD;  Location: [place];  Service: Ophthalmology;  Laterality: Right;  Right eye   GAS/FLUID EXCHANGE Right 04/28/2021   Procedure: GAS/FLUID EXCHANGE;  Surgeon: Jalene Mullet, MD;  Location: [facility];  Service: Ophthalmology;  Laterality: Right;  Right eye   LASER PHOTO ABLATION Right 11/24/2021   Procedure: ENDO LASER PAN PHOTOCOAGULATION;  Surgeon: Jalene Mullet, MD;  Location: [place] [facility];  Service: Ophthalmology;  Laterality: Right;   LEFT HEART CATH AND CORONARY ANGIOGRAPHY N/A 03/20/2017   Procedure: LEFT HEART CATH AND CORONARY ANGIOGRAPHY;  Surgeon: Burnell Blanks, MD;  Location: [facility];  Service: Cardiovascular;  Laterality: N/A;   LEFT HEART CATH AND CORONARY ANGIOGRAPHY N/A 02/08/2018   Procedure: LEFT HEART CATH AND CORONARY ANGIOGRAPHY;  Surgeon: Belva Crome, MD;  Location: [facility];  Service: Cardiovascular;  Laterality: N/A;   LEFT HEART CATH AND CORONARY ANGIOGRAPHY N/A 10/29/2018   Procedure: LEFT HEART CATH AND CORONARY ANGIOGRAPHY;  Surgeon: Burnell Blanks, MD;  Location: [place] [facility];  Service: Cardiovascular;  Laterality: N/A;   LEFT HEART CATH AND CORS/GRAFTS ANGIOGRAPHY N/A 10/04/2020   Procedure: LEFT HEART CATH AND CORS/GRAFTS ANGIOGRAPHY;  Surgeon: Sherren Mocha, MD;  Location: [place] [facility];  Service: Cardiovascular;  Laterality: N/A;   LEFT HEART CATH AND CORS/GRAFTS ANGIOGRAPHY N/A 05/11/2021   Procedure: LEFT HEART CATH AND CORS/GRAFTS ANGIOGRAPHY;  Surgeon: Martinique, Peter M, MD;  Location: [place] [facility];  Service: Cardiovascular;  Laterality: N/A;   PARS PLANA VITRECTOMY Right 04/28/2021   Procedure: PARS PLANA VITRECTOMY WITH 25 GAUGE REMOVAL OF TRATIONAL MEMBRANE RIGHT EYE; DRAINAGE OF SUBRETINAL FLUID RIGHT EYE;  Surgeon: Jalene Mullet, MD;  Location: [facility];  Service: Ophthalmology;  Laterality: Right;  Right eye   PHOTOCOAGULATION WITH LASER Right 04/28/2021   Procedure: PHOTOCOAGULATION WITH LASER;  Surgeon:  Jalene Mullet, MD;  Location: [facility];  Service: Ophthalmology;  Laterality: Right;  Right eye   POLYPECTOMY  09/29/2021   Procedure: POLYPECTOMY;  Surgeon: Yetta Flock, MD;  Location[facility];  Service: Gastroenterology;;   TEE WITHOUT CARDIOVERSION N/A 11/04/2018   Procedure: TRANSESOPHAGEAL ECHOCARDIOGRAM (TEE);  Surgeon: Wonda Olds, MD;  Location: [facility];  Service: Open Heart Surgery;  Laterality: N/A;   Family History  Problem Relation Age of Onset   Cancer Mother        T cell lymphoma   Hypertension Mother    Hypercalcemia Mother    OCD Father    Anxiety disorder Father    Coronary artery disease Father    Congestive Heart Failure Father    Asthma Father    Hypercalcemia Father  Hypertension Father    Colon polyps Father    Depression Maternal Grandmother    Diabetes Paternal Grandmother    Stomach cancer Neg Hx    Esophageal cancer Neg Hx    Colon cancer Neg Hx    Rectal cancer Neg Hx    Social History   Tobacco Use   Smoking status: Former    Types: Cigarettes   Smokeless tobacco: Never   Tobacco comments:    Smoked cigarettes in her 20's for 1 year  Vaping Use   Vaping Use: Never used  Substance Use Topics   Alcohol use: Not Currently    Comment: Socially   Drug use: Yes    Types: Marijuana    Comment: a few times weekly, last dose 11/22/21   Current Outpatient Medications  Medication Sig Dispense Refill   acetaminophen (TYLENOL) 325 MG tablet Take 650 mg by mouth daily as needed for mild pain, fever or headache.     albuterol (VENTOLIN HFA) 108 (90 Base) MCG/ACT inhaler INHALE 2 PUFFS INTO THE LUNGS EVERY 6 (SIX) HOURS AS NEEDED FOR WHEEZING OR SHORTNESS OF BREATH. 18 g 2   Alirocumab (PRALUENT) 150 MG/ML SOAJ Inject 1 pen  into the skin every 14 (fourteen) days. 2 mL 11   aspirin 81 MG EC tablet Take 1 tablet (81 mg total) by mouth daily. 90 tablet 0   b complex vitamins tablet Take 1 tablet by mouth 3 (three) times a week.      budesonide (PULMICORT) 0.25 MG/2ML nebulizer solution Take 0.25 mg by nebulization daily as needed (shortness of breath/wheezing).     buPROPion (WELLBUTRIN XL) 150 MG 24 hr tablet Take 1 tablet (150 mg total) by mouth every morning. 30 tablet 1   clopidogrel (PLAVIX) 75 MG tablet Take 1 tablet (75 mg total) by mouth daily with breakfast. 90 tablet 2   diclofenac Sodium (VOLTAREN) 1 % GEL Apply 2-4 g topically 3 (three) times daily as needed (knee pain).     diphenhydrAMINE (BENADRYL) 25 MG tablet Take 37.5 mg by mouth at bedtime.     DULoxetine (CYMBALTA) 20 MG capsule Take 1 capsule (20 mg total) by mouth daily. 30 capsule 1   erythromycin ophthalmic ointment Apply 1 cm twice daily to right eye for 1 week. 3.5 g 0   fluticasone (FLONASE) 50 MCG/ACT nasal spray Place 1 spray into both nostrils at bedtime as needed for allergies.     furosemide (LASIX) 20 MG tablet Take 1 tablet (20 mg total) by mouth daily as needed (for weight gain). 15 tablet 3   Glucagon 3 MG/DOSE POWD Place 3 mg into the nose once as needed for up to 1 dose. 1 each 11   hydrOXYzine (ATARAX) 25 MG tablet Take 1 tablet (25 mg total) by mouth 3 (three) times daily as needed. 90 tablet 1   insulin lispro (HUMALOG) 100 UNIT/ML injection Inject 0.35 mLs (35 Units total) into the skin daily via pump. 10 mL 1   losartan (COZAAR) 25 MG tablet Take 0.5 tablets (12.5 mg total) by mouth daily. 45 tablet 3   methylcellulose (CITRUCEL) oral powder Take 1 packet by mouth daily.     metoprolol succinate (TOPROL-XL) 25 MG 24 hr tablet Take 0.5 tablets (12.5 mg total) by mouth daily. 15 tablet 6   montelukast (SINGULAIR) 10 MG tablet Take 1 tablet (10 mg total) by mouth at bedtime. 90 tablet 2   Multiple Vitamin (MULTIVITAMIN WITH MINERALS) TABS tablet Take 1 tablet by  mouth daily.     nitroGLYCERIN (NITROSTAT) 0.4 MG SL tablet Place 1 tablet (0.4 mg total) under the tongue every 5 (five) minutes as needed for chest pain. 25 tablet 6    ofloxacin (OCUFLOX) 0.3 % ophthalmic solution Place 1 drop into the right eye 4 (four) times daily for 1 week. 5 mL 0   omeprazole (PRILOSEC OTC) 20 MG tablet Take 20 mg by mouth daily as needed (acid reflux).     OVER THE COUNTER MEDICATION Take 3 capsules by mouth daily before breakfast. [employer] Super Lysine Immune Support     prednisoLONE acetate (PRED FORTE) 1 % ophthalmic suspension Place 1 drop into the right eye 4 (four) times daily for 7 days, THEN 1 drop 3 (three) times daily for 7 days, THEN 1 drop 2 (two) times daily for 7 days, THEN 1 drop daily for 7 days. 10 mL 0   spironolactone (ALDACTONE) 25 MG tablet Take 12.5 mg by mouth daily.     levothyroxine (SYNTHROID) 100 MCG tablet TAKE 1 TABLET (100 MCG TOTAL) BY MOUTH DAILY BEFORE BREAKFAST. 90 tablet 3   metoCLOPramide (REGLAN) 5 MG tablet Take 1 tablet (5 mg total) by mouth every 8 (eight) hours as needed for nausea or vomiting. 60 tablet 3   No current facility-administered medications for this visit.   Allergies  Allergen Reactions   Atorvastatin Other (See Comments)    Myalgias    Crestor [Rosuvastatin Calcium] Other (See Comments)    Severe myalgias and joint pain. Has tolerated atorvastatin though   Monosodium Glutamate Nausea And Vomiting and Other (See Comments)    MSG - migraine   Zetia [Ezetimibe] Other (See Comments)    myalgias   Ciprofloxin Hcl [Ciprofloxacin] Nausea And Vomiting and Other (See Comments)    Severe migraine   Food Color Red [Red Dye] Diarrhea     Review of Systems: All systems reviewed and negative except where noted in HPI.    NM Gastric Emptying  Result Date: 12/02/2021 CLINICAL DATA:  Nausea and vomiting.  Early satiety EXAM: NUCLEAR MEDICINE GASTRIC EMPTYING SCAN TECHNIQUE: After oral ingestion of radiolabeled meal, sequential abdominal images were obtained for 3 hours. Percentage of activity emptying the stomach was calculated at 1 hour, 2 hour, and 3 hours. RADIOPHARMACEUTICALS:   2.0 mCi Tc-72msulfur colloid in standardized meal COMPARISON:  CT May 30, 2021 FINDINGS: Expected location of the stomach in the left upper quadrant. Ingested meal empties the stomach gradually over the course of the study. 46% emptied at 1 hr ( normal >= 10%) 80% emptied at 2 hr ( normal >= 40%) 97% emptied at 3 hr ( normal >= 70%) IMPRESSION: Normal gastric emptying study. Electronically Signed   By: JDahlia BailiffM.D.   On: 12/02/2021 11:46    Lab Results  Component Value Date   WBC 5.4 11/24/2021   HGB 11.5 (L) 11/24/2021   HCT 35.2 (L) 11/24/2021   MCV 93.9 11/24/2021   PLT 405 (H) 11/24/2021    Lab Results  Component Value Date   CREATININE 0.90 11/24/2021   BUN 15 11/24/2021   NA 137 11/24/2021   K 3.8 11/24/2021   CL 108 11/24/2021   CO2 21 (L) 11/24/2021    Lab Results  Component Value Date   ALT 20 11/03/2021   AST 29 11/03/2021   ALKPHOS 71 11/03/2021   BILITOT 0.6 11/03/2021     Physical Exam: BP 102/68   Pulse 95   Ht '5\' 5"'$  (  1.651 m)   Wt 151 lb 9.6 oz (68.8 kg)   LMP 11/25/2021   SpO2 98%   BMI 25.23 kg/m  Constitutional: Pleasant,well-developed, female in no acute distress. Neurological: Alert and oriented to person place and time. Psychiatric: Normal mood and affect. Behavior is normal.   ASSESSMENT: 51 y.o. female here for assessment of the following  1. Nausea and vomiting, unspecified vomiting type   2. Marijuana use   3. Gastric intestinal metaplasia   4. Altered bowel habits   5. Gastroparesis    Discussed her course at length with her.  She has had numerous hospitalizations involving nausea and vomiting and DKA in the past.  I reassured her that her EGD looks okay in regards to no outlet obstruction, her CT scan does not show any concerning pathology, and her gastric emptying study was normal.  She is definitely at risk for gastroparesis, perhaps when she is in DKA this stimulates some slowed motility but her most recent GES was normal.  At  baseline she smokes a lot of marijuana and I discussed that it could certainly be playing a role in her nausea vomiting episodes.  We discussed cannabinoid hyperemesis syndrome, I gave her some handouts about this.  She does feel like it is affecting her joints and muscles, I do recommend she abstain from this given her other medical problems, long-term I think she will be better off without it.  She will see how she does and try to abstain from it.  Moving forward, she takes about 2 Reglan per month at onset of nausea and this does help prevent episodes and this has been helping her.  Even though she does not have gastroparesis on GES, I will continue low-dose Reglan as needed to help keep her out of the hospital and prevent episodes.  She is not using omeprazole daily but if she has reflux she can use it as needed.  Of note, with chronic poor appetite, Remeron is something to consider for her, however this would interact with the rest of her psychotropic medications.  We need to discuss with her behavioral health team if we want to consider this at some point time.  We discussed her bowel habits, her colonoscopy looked good without any high risk pathology, no microscopic colitis.  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  I recommend daily fiber supplement such as Citrucel once daily to see if this will provide some regularity.  Finally I reviewed gastric intestinal metaplasia with her.  She has no family history of gastric cancer.  She does have multifocal GIM, discussed theoretical increased risk for gastric cancer although overall risk thought to be low.  No high risk lesions on this exam. We can consider surveillance EGD at some point in time although she is higher than average risk for anesthesia, we will discuss this further at her next visit.   PLAN: - stop routine marijuana use - given handout on cannabinoid hyperemesis syndrome - continue omeprazole PRN -  continue Reglan PRN - intermittent dosing - refill PRN - as it helps her - discussed GIM - may consider surveillance EGD at some point but higher than average risk for anesthesia - consideration for Remeron in the future to help appetite, but due to interactions with her other regimen would need to discuss with her behavioral health team - start Citrucel once daily - follow up 6 months or sooner with issues  Jolly Mango, MD [facility]

## 2021-12-26 ENCOUNTER — Encounter (HOSPITAL_COMMUNITY): Payer: Self-pay | Admitting: Ophthalmology

## 2021-12-26 ENCOUNTER — Other Ambulatory Visit: Payer: Self-pay

## 2021-12-26 NOTE — Progress Notes (Addendum)
Jennifer Hogan  denies chest pain or shortness of breath. Patient denies having any s/s of Covid in her household, also denies any known exposure to Covid.  Jennifer Hogan has type I diabetes, patient wears a Insulin pump. I instructed patient to decrease basal rate by 20% at midnight. I instructed patient to check CBG after awaking and every 2 hours until arrival  to the hospital.  I Instructed Jennifer Hogan if CBG is less than 70 to take 4 Glucose Tablets or 1 tube of Glucose Gel or 1/2 cup of a clear juice. Recheck CBG in 15 minutes if CBG is not over 70 call, pre- op desk at [phone] for further instructions. Jennifer Hogan's endocrinologist is Dr. Benjiman Core.  Jennifer Hogan has extensive heart disease, had 1 -2 MIs, has 4 stents in heart vessels.  Jennifer Hogan's cardiologist is Dr. Fransico Him, Dr,. Bensihom is patient's CHF cardiologist. Jennifer Hogan felt is in a research project under Dr. Sung Amabile care, it is a monitor that she has to long information 1 time a day. Jennifer Hogan reports chest pain which started in the past month, pain is a dull pain in left shoulder, denies diaphoresis, lightheadedness, shortness of breath. Patient reports that the pain last 30 minutes, patient has not taken NTG and she has not reported the pain to her cardiologist. I spoke with Dr. Glennon Mac, she is going to have a cardiology evaluate in am.  Dr. Glennon Mac said to tell patient to come to ED if she has more chest pain.

## 2021-12-27 ENCOUNTER — Ambulatory Visit (HOSPITAL_BASED_OUTPATIENT_CLINIC_OR_DEPARTMENT_OTHER): Payer: Medicaid Other | Admitting: Anesthesiology

## 2021-12-27 ENCOUNTER — Other Ambulatory Visit: Payer: Self-pay

## 2021-12-27 ENCOUNTER — Ambulatory Visit (HOSPITAL_COMMUNITY): Payer: Medicaid Other | Admitting: Anesthesiology

## 2021-12-27 ENCOUNTER — Ambulatory Visit (HOSPITAL_COMMUNITY)
Admission: RE | Admit: 2021-12-27 | Discharge: 2021-12-27 | Disposition: A | Discharge status: Home / Self Care | Payer: Medicaid Other | Attending: Ophthalmology | Admitting: Ophthalmology

## 2021-12-27 ENCOUNTER — Encounter (HOSPITAL_COMMUNITY)
Admission: RE | Disposition: A | Discharge status: Home / Self Care | Payer: Self-pay | Source: Home / Self Care | Attending: Ophthalmology

## 2021-12-27 ENCOUNTER — Encounter (HOSPITAL_COMMUNITY): Payer: Self-pay | Admitting: Ophthalmology

## 2021-12-27 DIAGNOSIS — I252 Old myocardial infarction: Secondary | ICD-10-CM | POA: Diagnosis not present

## 2021-12-27 DIAGNOSIS — K219 Gastro-esophageal reflux disease without esophagitis: Secondary | ICD-10-CM | POA: Insufficient documentation

## 2021-12-27 DIAGNOSIS — E103599 Type 1 diabetes mellitus with proliferative diabetic retinopathy without macular edema, unspecified eye: Secondary | ICD-10-CM | POA: Diagnosis not present

## 2021-12-27 DIAGNOSIS — I509 Heart failure, unspecified: Secondary | ICD-10-CM | POA: Insufficient documentation

## 2021-12-27 DIAGNOSIS — I2510 Atherosclerotic heart disease of native coronary artery without angina pectoris: Secondary | ICD-10-CM | POA: Diagnosis not present

## 2021-12-27 DIAGNOSIS — H4311 Vitreous hemorrhage, right eye: Secondary | ICD-10-CM | POA: Diagnosis present

## 2021-12-27 DIAGNOSIS — Z794 Long term (current) use of insulin: Secondary | ICD-10-CM | POA: Diagnosis not present

## 2021-12-27 DIAGNOSIS — Z87891 Personal history of nicotine dependence: Secondary | ICD-10-CM | POA: Diagnosis not present

## 2021-12-27 DIAGNOSIS — J45909 Unspecified asthma, uncomplicated: Secondary | ICD-10-CM | POA: Insufficient documentation

## 2021-12-27 DIAGNOSIS — E103511 Type 1 diabetes mellitus with proliferative diabetic retinopathy with macular edema, right eye: Secondary | ICD-10-CM | POA: Insufficient documentation

## 2021-12-27 DIAGNOSIS — E039 Hypothyroidism, unspecified: Secondary | ICD-10-CM | POA: Diagnosis not present

## 2021-12-27 DIAGNOSIS — Z955 Presence of coronary angioplasty implant and graft: Secondary | ICD-10-CM | POA: Insufficient documentation

## 2021-12-27 DIAGNOSIS — H2101 Hyphema, right eye: Secondary | ICD-10-CM | POA: Diagnosis not present

## 2021-12-27 HISTORY — PX: AIR/FLUID EXCHANGE: SHX6494

## 2021-12-27 HISTORY — PX: LASER PHOTO ABLATION: SHX5942

## 2021-12-27 HISTORY — PX: PARS PLANA VITRECTOMY 27 GAUGE: SHX6738

## 2021-12-27 LAB — CBC
HCT: 35.2 % — ABNORMAL LOW (ref 36.0–46.0)
Hemoglobin: 11.9 g/dL — ABNORMAL LOW (ref 12.0–15.0)
MCH: 30.8 pg (ref 26.0–34.0)
MCHC: 33.8 g/dL (ref 30.0–36.0)
MCV: 91.2 fL (ref 80.0–100.0)
Platelets: 384 10*3/uL (ref 150–400)
RBC: 3.86 MIL/uL — ABNORMAL LOW (ref 3.87–5.11)
RDW: 13.2 % (ref 11.5–15.5)
WBC: 5.4 10*3/uL (ref 4.0–10.5)
nRBC: 0 % (ref 0.0–0.2)

## 2021-12-27 LAB — GLUCOSE, CAPILLARY
Glucose-Capillary: 98 mg/dL (ref 70–99)
Glucose-Capillary: 67 mg/dL — ABNORMAL LOW (ref 70–99)
Glucose-Capillary: 143 mg/dL — ABNORMAL HIGH (ref 70–99)
Glucose-Capillary: 113 mg/dL — ABNORMAL HIGH (ref 70–99)

## 2021-12-27 LAB — BASIC METABOLIC PANEL
Anion gap: 7 (ref 5–15)
BUN: 18 mg/dL (ref 6–20)
CO2: 21 mmol/L — ABNORMAL LOW (ref 22–32)
Calcium: 9.2 mg/dL (ref 8.9–10.3)
Chloride: 106 mmol/L (ref 98–111)
Creatinine, Ser: 0.9 mg/dL (ref 0.44–1.00)
GFR, Estimated: >60 mL/min (ref >60)
Glucose, Bld: 87 mg/dL (ref 70–99)
Potassium: 3.9 mmol/L (ref 3.5–5.1)
Sodium: 134 mmol/L — ABNORMAL LOW (ref 135–145)

## 2021-12-27 LAB — POCT PREGNANCY, URINE: Preg Test, Ur: NEGATIVE

## 2021-12-27 SURGERY — PARS PLANA VITRECTOMY 27 GAUGE
Anesthesia: General | Site: Eye | Laterality: Right

## 2021-12-27 MED ORDER — DEXAMETHASONE SODIUM PHOSPHATE 10 MG/ML IJ SOLN
INTRAMUSCULAR | Status: DC | PRN
  Administered 2021-12-27: 4 mg via INTRAVENOUS

## 2021-12-27 MED ORDER — BSS PLUS IO SOLN
INTRAOCULAR | Status: AC
  Filled 2021-12-27: qty 500

## 2021-12-27 MED ORDER — LIDOCAINE HCL 2 % IJ SOLN
INTRAMUSCULAR | Status: DC | PRN
  Administered 2021-12-27: .5 mL via RETROBULBAR

## 2021-12-27 MED ORDER — AMISULPRIDE (ANTIEMETIC) 5 MG/2ML IV SOLN: 10.0000 mg | Freq: Once | INTRAVENOUS | Status: DC | PRN

## 2021-12-27 MED ORDER — DEXTROSE 50 % IV SOLN
INTRAVENOUS | Status: AC
  Filled 2021-12-27: qty 50

## 2021-12-27 MED ORDER — BUPIVACAINE HCL (PF) 0.75 % IJ SOLN
INTRAMUSCULAR | Status: AC
  Filled 2021-12-27: qty 10

## 2021-12-27 MED ORDER — LIDOCAINE 2% (20 MG/ML) 5 ML SYRINGE
INTRAMUSCULAR | Status: DC | PRN
  Administered 2021-12-27: 60 mg via INTRAVENOUS

## 2021-12-27 MED ORDER — EPHEDRINE 5 MG/ML INJ
INTRAVENOUS | Status: AC
  Filled 2021-12-27: qty 5

## 2021-12-27 MED ORDER — ACETAMINOPHEN 500 MG PO TABS
1000.0000 mg | ORAL_TABLET | Freq: Once | ORAL | Status: AC
  Administered 2021-12-27: 1000 mg via ORAL
  Filled 2021-12-27: qty 2

## 2021-12-27 MED ORDER — DEXAMETHASONE SODIUM PHOSPHATE 10 MG/ML IJ SOLN
INTRAMUSCULAR | Status: AC
  Filled 2021-12-27: qty 1

## 2021-12-27 MED ORDER — TOBRAMYCIN-DEXAMETHASONE 0.3-0.1 % OP OINT
TOPICAL_OINTMENT | OPHTHALMIC | Status: DC | PRN
  Administered 2021-12-27: 1 via OPHTHALMIC

## 2021-12-27 MED ORDER — FENTANYL CITRATE (PF) 100 MCG/2ML IJ SOLN
INTRAMUSCULAR | Status: DC | PRN
  Administered 2021-12-27: 100 ug via INTRAVENOUS
  Administered 2021-12-27: 50 ug via INTRAVENOUS

## 2021-12-27 MED ORDER — LIDOCAINE 2% (20 MG/ML) 5 ML SYRINGE
INTRAMUSCULAR | Status: AC
  Filled 2021-12-27: qty 5

## 2021-12-27 MED ORDER — TOBRAMYCIN-DEXAMETHASONE 0.3-0.1 % OP OINT
TOPICAL_OINTMENT | OPHTHALMIC | Status: AC
  Filled 2021-12-27: qty 3.5

## 2021-12-27 MED ORDER — MIDAZOLAM HCL 2 MG/2ML IJ SOLN
INTRAMUSCULAR | Status: DC | PRN
  Administered 2021-12-27: 2 mg via INTRAVENOUS

## 2021-12-27 MED ORDER — OXYCODONE HCL 5 MG PO TABS: 5.0000 mg | ORAL_TABLET | Freq: Once | ORAL | Status: DC | PRN

## 2021-12-27 MED ORDER — PHENYLEPHRINE HCL 2.5 % OP SOLN
1.0000 [drp] | OPHTHALMIC | Status: AC | PRN
  Administered 2021-12-27 (×3): 1 [drp] via OPHTHALMIC
  Filled 2021-12-27: qty 2

## 2021-12-27 MED ORDER — NA CHONDROIT SULF-NA HYALURON 40-30 MG/ML IO SOSY
INTRAOCULAR | Status: DC | PRN
  Administered 2021-12-27: .5 mL via INTRAOCULAR

## 2021-12-27 MED ORDER — PROPOFOL 10 MG/ML IV BOLUS
INTRAVENOUS | Status: DC | PRN
  Administered 2021-12-27: 20 mg via INTRAVENOUS
  Administered 2021-12-27: 70 mg via INTRAVENOUS
  Administered 2021-12-27: 10 mg via INTRAVENOUS

## 2021-12-27 MED ORDER — TROPICAMIDE 1 % OP SOLN
1.0000 [drp] | OPHTHALMIC | Status: AC | PRN
  Administered 2021-12-27 (×3): 1 [drp] via OPHTHALMIC
  Filled 2021-12-27: qty 15

## 2021-12-27 MED ORDER — EPINEPHRINE PF 1 MG/ML IJ SOLN
INTRAMUSCULAR | Status: AC
  Filled 2021-12-27: qty 1

## 2021-12-27 MED ORDER — FENTANYL CITRATE (PF) 250 MCG/5ML IJ SOLN
INTRAMUSCULAR | Status: AC
  Filled 2021-12-27: qty 5

## 2021-12-27 MED ORDER — SODIUM CHLORIDE 0.9 % IV SOLN: INTRAVENOUS | Status: DC | PRN

## 2021-12-27 MED ORDER — CHLORHEXIDINE GLUCONATE 0.12 % MT SOLN
OROMUCOSAL | Status: AC
  Administered 2021-12-27: 15 mL via OROMUCOSAL
  Filled 2021-12-27: qty 15

## 2021-12-27 MED ORDER — EPHEDRINE SULFATE-NACL 50-0.9 MG/10ML-% IV SOSY
PREFILLED_SYRINGE | INTRAVENOUS | Status: DC | PRN
  Administered 2021-12-27: 10 mg via INTRAVENOUS

## 2021-12-27 MED ORDER — LIDOCAINE HCL 2 % IJ SOLN
INTRAMUSCULAR | Status: AC
  Filled 2021-12-27: qty 20

## 2021-12-27 MED ORDER — PROPARACAINE HCL 0.5 % OP SOLN
1.0000 [drp] | OPHTHALMIC | Status: AC | PRN
  Administered 2021-12-27 (×3): 1 [drp] via OPHTHALMIC
  Filled 2021-12-27: qty 15

## 2021-12-27 MED ORDER — CHLORHEXIDINE GLUCONATE 0.12 % MT SOLN: 15.0000 mL | Freq: Once | OROMUCOSAL | Status: AC

## 2021-12-27 MED ORDER — ONDANSETRON HCL 4 MG/2ML IJ SOLN
INTRAMUSCULAR | Status: DC | PRN
  Administered 2021-12-27: 4 mg via INTRAVENOUS

## 2021-12-27 MED ORDER — ROCURONIUM BROMIDE 10 MG/ML (PF) SYRINGE
PREFILLED_SYRINGE | INTRAVENOUS | Status: DC | PRN
  Administered 2021-12-27: 50 mg via INTRAVENOUS

## 2021-12-27 MED ORDER — MIDAZOLAM HCL 2 MG/2ML IJ SOLN
INTRAMUSCULAR | Status: AC
  Filled 2021-12-27: qty 2

## 2021-12-27 MED ORDER — FENTANYL CITRATE (PF) 100 MCG/2ML IJ SOLN: 25.0000 ug | INTRAMUSCULAR | Status: DC | PRN

## 2021-12-27 MED ORDER — OXYCODONE HCL 5 MG/5ML PO SOLN: 5.0000 mg | Freq: Once | ORAL | Status: DC | PRN

## 2021-12-27 MED ORDER — BSS IO SOLN
INTRAOCULAR | Status: DC | PRN
  Administered 2021-12-27: 15 mL via INTRAOCULAR

## 2021-12-27 MED ORDER — EPINEPHRINE PF 1 MG/ML IJ SOLN
INTRAOCULAR | Status: DC | PRN
  Administered 2021-12-27: 500 mL

## 2021-12-27 MED ORDER — OFLOXACIN 0.3 % OP SOLN
1.0000 [drp] | OPHTHALMIC | Status: AC | PRN
  Administered 2021-12-27 (×3): 1 [drp] via OPHTHALMIC
  Filled 2021-12-27: qty 5

## 2021-12-27 MED ORDER — PHENYLEPHRINE HCL 10 % OP SOLN
1.0000 [drp] | OPHTHALMIC | Status: AC | PRN
  Administered 2021-12-27 (×3): 1 [drp] via OPHTHALMIC
  Filled 2021-12-27: qty 5

## 2021-12-27 MED ORDER — DEXAMETHASONE SODIUM PHOSPHATE 10 MG/ML IJ SOLN
INTRAMUSCULAR | Status: DC | PRN
  Administered 2021-12-27: 10 mg via INTRAMUSCULAR

## 2021-12-27 MED ORDER — ATROPINE SULFATE 1 % OP SOLN
OPHTHALMIC | Status: AC
  Filled 2021-12-27: qty 5

## 2021-12-27 MED ORDER — ORAL CARE MOUTH RINSE: 15.0000 mL | Freq: Once | OROMUCOSAL | Status: AC

## 2021-12-27 MED ORDER — HYALURONIDASE HUMAN 150 UNIT/ML IJ SOLN
INTRAMUSCULAR | Status: AC
  Filled 2021-12-27: qty 1

## 2021-12-27 MED ORDER — ONDANSETRON HCL 4 MG/2ML IJ SOLN
INTRAMUSCULAR | Status: AC
  Filled 2021-12-27: qty 2

## 2021-12-27 MED ORDER — PROPOFOL 10 MG/ML IV BOLUS
INTRAVENOUS | Status: AC
  Filled 2021-12-27: qty 20

## 2021-12-27 MED ORDER — CEFAZOLIN SUBCONJUNCTIVAL INJECTION 100 MG/0.5 ML
100.0000 mg | INJECTION | SUBCONJUNCTIVAL | Status: AC
  Administered 2021-12-27: 100 mg via SUBCONJUNCTIVAL
  Filled 2021-12-27: qty 1

## 2021-12-27 MED ORDER — LACTATED RINGERS IV SOLN: INTRAVENOUS | Status: DC

## 2021-12-27 MED ORDER — ROCURONIUM BROMIDE 10 MG/ML (PF) SYRINGE
PREFILLED_SYRINGE | INTRAVENOUS | Status: AC
  Filled 2021-12-27: qty 10

## 2021-12-27 MED ORDER — BSS IO SOLN
INTRAOCULAR | Status: AC
  Filled 2021-12-27: qty 15

## 2021-12-27 SURGICAL SUPPLY — 70 items
BAG COUNTER SPONGE SURGICOUNT (BAG) ×1 IMPLANT
BAG SPNG CNTER NS LX DISP (BAG) ×1
BAND WRIST GAS GREEN (MISCELLANEOUS) IMPLANT
BLADE MVR KNIFE 20G (BLADE) IMPLANT
BLADE STAB KNIFE 15DEG (BLADE) IMPLANT
CANNULA ANT CHAM MAIN (OPHTHALMIC RELATED) IMPLANT
CANNULA DUAL BORE 23G (CANNULA) IMPLANT
CANNULA VLV SOFT TIP 27G (OPHTHALMIC) ×1 IMPLANT
CANNULA VLV SOFT TIP 27GA (OPHTHALMIC) ×2 IMPLANT
CAUTERY EYE LOW TEMP 1300F FIN (OPHTHALMIC RELATED) IMPLANT
CLSR STERI-STRIP ANTIMIC 1/2X4 (GAUZE/BANDAGES/DRESSINGS) ×1 IMPLANT
COVER MAYO STAND STRL (DRAPES) IMPLANT
DRAPE HALF SHEET 40X57 (DRAPES) ×1 IMPLANT
DRAPE INCISE 51X51 W/FILM STRL (DRAPES) IMPLANT
DRAPE RETRACTOR (MISCELLANEOUS) ×1 IMPLANT
ERASER HMR WETFIELD 23G BP (MISCELLANEOUS) IMPLANT
FORCEPS ECKARDT ILM 25G SERR (OPHTHALMIC RELATED) IMPLANT
FORCEPS GRIESHABER ILM 25G A (INSTRUMENTS) IMPLANT
FORCEPS GRIESHABER ILM 27G (INSTRUMENTS) IMPLANT
GAS AUTO FILL CONSTEL (OPHTHALMIC)
GAS AUTO FILL CONSTELLATION (OPHTHALMIC) IMPLANT
GAS WRIST BAND GREEN (MISCELLANEOUS)
GAUZE SPONGE 4X4 12PLY STRL (GAUZE/BANDAGES/DRESSINGS) IMPLANT
GLOVE SURG SYN 7.5  E (GLOVE) ×1
GLOVE SURG SYN 7.5 E (GLOVE) ×1 IMPLANT
GLOVE SURG SYN 7.5 PF PI (GLOVE) ×1 IMPLANT
GOWN STRL REUS W/ TWL LRG LVL3 (GOWN DISPOSABLE) ×1 IMPLANT
GOWN STRL REUS W/TWL LRG LVL3 (GOWN DISPOSABLE) ×1
KIT BASIN OR (CUSTOM PROCEDURE TRAY) ×1 IMPLANT
KIT TURNOVER KIT B (KITS) ×1 IMPLANT
LENS BIOM SUPER VIEW SET DISP (MISCELLANEOUS) ×1 IMPLANT
MICROPICK 25G (MISCELLANEOUS)
NDL 18GX1X1/2 (RX/OR ONLY) (NEEDLE) ×1 IMPLANT
NDL 25GX 5/8IN NON SAFETY (NEEDLE) ×1 IMPLANT
NDL 27GX1/2 REG BEVEL ECLIP (NEEDLE) ×1 IMPLANT
NDL FILTER BLUNT 18X1 1/2 (NEEDLE) ×1 IMPLANT
NDL HYPO 18GX1.5 BLUNT FILL (NEEDLE) IMPLANT
NDL HYPO 25GX1X1/2 BEV (NEEDLE) IMPLANT
NDL HYPO 30X.5 LL (NEEDLE) ×2 IMPLANT
NDL RETROBULBAR 25GX1.5 (NEEDLE) ×1 IMPLANT
NEEDLE 18GX1X1/2 (RX/OR ONLY) (NEEDLE) ×1 IMPLANT
NEEDLE 25GX 5/8IN NON SAFETY (NEEDLE) ×1 IMPLANT
NEEDLE 27GX1/2 REG BEVEL ECLIP (NEEDLE) ×1 IMPLANT
NEEDLE FILTER BLUNT 18X1 1/2 (NEEDLE) ×1 IMPLANT
NEEDLE HYPO 18GX1.5 BLUNT FILL (NEEDLE) ×1 IMPLANT
NEEDLE HYPO 25GX1X1/2 BEV (NEEDLE) IMPLANT
NEEDLE HYPO 30X.5 LL (NEEDLE) ×2 IMPLANT
NEEDLE RETROBULBAR 25GX1.5 (NEEDLE) ×2 IMPLANT
NS IRRIG 1000ML POUR BTL (IV SOLUTION) ×1 IMPLANT
PACK FRAGMATOME (OPHTHALMIC) IMPLANT
PACK VITRECTOMY CUSTOM (CUSTOM PROCEDURE TRAY) ×1 IMPLANT
PAD ARMBOARD 7.5X6 YLW CONV (MISCELLANEOUS) ×2 IMPLANT
PAK VITRECTOMY PIK  27GA (OPHTHALMIC) ×2
PAK VITRECTOMY PIK 27GA (OPHTHALMIC) ×1 IMPLANT
PIC ILLUMINATED 25G (OPHTHALMIC) ×1
PICK MICROPICK 25G (MISCELLANEOUS) IMPLANT
PIK ILLUMINATED 25G (OPHTHALMIC) IMPLANT
PROBE DIATHERMY DSP 27GA (MISCELLANEOUS) IMPLANT
PROBE LASER ILLUM ARTICUL 27G (OPHTHALMIC) IMPLANT
PROBE LASER ILLUM FLEX 27GA (OPHTHALMIC) IMPLANT
PROBE LASER ILLUM FLEX CVD 25G (OPHTHALMIC) IMPLANT
ROLLS DENTAL (MISCELLANEOUS) IMPLANT
SCRAPER DIAMOND 25GA (OPHTHALMIC RELATED) IMPLANT
SOL ANTI FOG 6CC (MISCELLANEOUS) ×1 IMPLANT
SYR 10ML LL (SYRINGE) IMPLANT
SYR 20ML LL LF (SYRINGE) ×1 IMPLANT
SYR 5ML LL (SYRINGE) ×1 IMPLANT
SYR TB 1ML LUER SLIP (SYRINGE) IMPLANT
WATER STERILE IRR 1000ML POUR (IV SOLUTION) ×1 IMPLANT
WIPE INSTRUMENT VISIWIPE 73X73 (MISCELLANEOUS) IMPLANT

## 2021-12-27 NOTE — Discharge Instructions (Addendum)
DO NOT SLEEP ON BACK, THE EYE PRESSURE CAN GO UP AND CAUSE VISION LOSS   SLEEP ON SIDE WITH NOSE TO PILLOW  DURING DAY KEEP UPRIGHT 

## 2021-12-27 NOTE — Anesthesia Preprocedure Evaluation (Addendum)
Anesthesia Evaluation  Patient identified by MRN, date of birth, ID band Patient awake    Reviewed: Allergy & Precautions, NPO status , Patient's Chart, lab work & pertinent test results, reviewed documented beta blocker date and time   History of Anesthesia Complications Negative for: history of anesthetic complications  Airway Mallampati: II  TM Distance: >3 FB Neck ROM: Full    Dental no notable dental hx.    Pulmonary asthma , former smoker   Pulmonary exam normal        Cardiovascular Pt. on home beta blockers + CAD, + Past MI (2019, on Plavix), + Cardiac Stents (2019, 2020) and +CHF  Normal cardiovascular exam  TTE 05/11/21: Diffuse hypokinesis worse in septum and apex, EF 30-35%, severe LVE, mild MR     Neuro/Psych   Anxiety Depression    negative neurological ROS     GI/Hepatic Neg liver ROS,GERD  Medicated,,  Endo/Other  diabetes, Type 1, Insulin DependentHypothyroidism    Renal/GU Renal disease  negative genitourinary   Musculoskeletal negative musculoskeletal ROS (+)    Abdominal   Peds  Hematology negative hematology ROS (+)   Anesthesia Other Findings VITREOUS HEMORRHAGE RIGHT EYE  Reproductive/Obstetrics negative OB ROS                             Anesthesia Physical Anesthesia Plan  ASA: 3  Anesthesia Plan: General   Post-op Pain Management: Tylenol PO (pre-op)*   Induction: Intravenous  PONV Risk Score and Plan: 3 and Treatment may vary due to age or medical condition, Midazolam, Dexamethasone and Ondansetron  Airway Management Planned: Oral ETT  Additional Equipment: ClearSight  Intra-op Plan:   Post-operative Plan: Extubation in OR  Informed Consent:   Plan Discussed with:   Anesthesia Plan Comments: (Patient reported chest discomfort in past month to preop RN yesterday during PAT call. During our discussion, she states that she had several minutes of  left upper chest tightness approximately 3 weeks ago which spontaneously resolved. She has had this same discomfort several times over the past several years, not associated with exertion, dyspnea, diaphoresis, or nausea. She feels that this discomfort is caused by indigestion. She has had no change in her functional status but does feel more deconditioned overall over the past year or so. She still manages to stay fairly active without any significant limitations. She denies any recent weight gain, leg swelling, orthopnea. Plan for Clearsight, GETA for this urgent procedure. Daiva Huge, MD)       Anesthesia Quick Evaluation

## 2021-12-27 NOTE — H&P (Signed)
Date of examination:  12/27/2021  Indication for surgery: vitreous hemorrhage right eye  Pertinent past medical history:  Past Medical History:  Diagnosis Date   Allergy    Anemia    Anxiety    Asthma    Cataract    CHF (congestive heart failure) ([place])    Coronary artery disease    a. s/p PTCA DES x3 in the LAD (ostial DES placed, mid DES placed, distal DES placed) and PTCA/DES x1 to intermediate branch 02/2017. b. ACS 02/08/18 with LCx stent placement.   Depression    Diabetes mellitus    type 1   DKA (diabetic ketoacidoses) 12/31/2017   Dyspnea    with exertion and dust, no oxygen   Elevated platelet count    GERD (gastroesophageal reflux disease)    Heart murmur    never caused any problems   High cholesterol    Hypothyroidism    Ischemic cardiomyopathy    a. EF low-normal with basal inferior akinesis, basal septal hypokinesis by echo 01/2018.   Myocardial infarction ([place]) 2019   Nausea and vomiting 12/04/2010   Substance abuse ([place])    marijuana    Pertinent ocular history:  prolfierative diabetic retinopathy  Pertinent family history:  Family History  Problem Relation Age of Onset   Cancer Mother        T cell lymphoma   Hypertension Mother    Hypercalcemia Mother    OCD Father    Anxiety disorder Father    Coronary artery disease Father    Congestive Heart Failure Father    Asthma Father    Hypercalcemia Father    Hypertension Father    Colon polyps Father    Depression Maternal Grandmother    Diabetes Paternal Grandmother    Stomach cancer Neg Hx    Esophageal cancer Neg Hx    Colon cancer Neg Hx    Rectal cancer Neg Hx     General:  Healthy appearing patient in no distress.     Eyes:    Acuity OD HM    External: Within normal limits      Anterior segment: Within normal limits        Fundus: No view  - B-scan retina attached     Impression:  Vitreous hemorrhage right eye  Plan:    Vitrectomy and endolaser right eye  Jalene Mullet, MD

## 2021-12-27 NOTE — Transfer of Care (Signed)
Immediate Anesthesia Transfer of Care Note  Patient: Jennifer Hogan  Procedure(s) Performed: PARS PLANA VITRECTOMY 27 GAUGE (Right: Eye) ENDO LASER (Right: Eye) AIR/FLUID EXCHANGE (Right: Eye)  Patient Location: PACU  Anesthesia Type:General  Level of Consciousness: drowsy and patient cooperative  Airway & Oxygen Therapy: Patient Spontanous Breathing and Patient connected to nasal cannula oxygen  Post-op Assessment: Report given to RN, Post -op Vital signs reviewed and stable, and Patient moving all extremities  Post vital signs: Reviewed and stable  Last Vitals:  Vitals Value Taken Time  BP 171/77 12/27/21 1755  Temp 36.8 C 12/27/21 1755  Pulse 86 12/27/21 1759  Resp 14 12/27/21 1759  SpO2 97 % 12/27/21 1759  Vitals shown include unvalidated device data.  Last Pain:  Vitals:   12/27/21 1324  TempSrc:   PainSc: 0-No pain         Complications: No notable events documented.

## 2021-12-27 NOTE — Op Note (Signed)
Jennifer Hogan 12/27/2021 Diagnosis: Vitreous hemorrhage and hyphema right eye  Procedure: [place] Vitrectomy, Endolaser, and anterior chamber washout, capsulectomy, and air/fluid exchange , removal of foreign material from the posterior chamber Operative Eye:  right eye  Surgeon: Royston Cowper Estimated Blood Loss: minimal Specimens for Pathology:  None Complications: none   The  patient was prepped and draped in the usual fashion for ocular surgery on the  right eye .  A lid speculum was placed.  Infusion line and trocar was placed at the 8 o'clock position approximately 3.5 mm from the surgical limbus.   The infusion line was allowed to run and then clamped when placed at the cannula opening. The line was inserted and secured to the drape with an adhesive strip.   Active trocars/cannula were placed at the 10 and 2 o'clock positions approximately 3.5 mm from the surgical limbus. The cannula was visualized in the vitreous cavity.  The light pipe and vitreous cutter were inserted into the vitreous cavity and the vitrector was used to remove the blood from the eye.  After vitrectomy, attention was directed to the anterior chamber and an anterior chamber washout was performed after a 15 degree blade was used to make a clear corneal wound..    During the course of aspiration of the blood the tip of the soft-tipped cannula was displaced in the eye.  The silicone tip was retrieved from the eye using hand over hand technique with the vitrector and ILM forceps.  3 rows of endolaser were applied 360 degrees to the periphery.  A partial air-fluid exchange was performed.  The superior cannulas were sequentially removed with concommitant tamponade using a cotton tipped applicator and noted to be air tight.  The infusion line and trocar were removed and the sclerotomy was noted to be air tight with normal intraocular pressure by digital palpapation.  Subconjunctival injections of  local  anesthetic, Ancef, and Dexamethasone '4mg'$ /18m were placed in the infero-medial quadrant.   The speculum and drapes were removed and the eye was patched with Polymixin/Bacitracin ophthalmic ointment. An eye shield was placed and the patient was transferred alert and conversant with stable vital signs to the post operative recovery area.  The patient tolerated the procedure well and no complications were noted.  NRoyston CowperMD

## 2021-12-27 NOTE — Brief Op Note (Signed)
12/27/2021  5:41 PM  PATIENT:  Jennifer Hogan  51 y.o. female  PRE-OPERATIVE DIAGNOSIS:  VITREOUS HEMORRHAGE RIGHT EYE, hyphema right eye  POST-OPERATIVE DIAGNOSIS:  VITREOUS HEMORRHAGE RIGHT EYE, hyphema right eye  PROCEDURE:  Procedure(s): PARS PLANA VITRECTOMY 27 GAUGE (Right) ENDO LASER (Right) AIR/FLUID EXCHANGE (Right) ANTERIOR CHAMBER WASHOUT (Right)  SURGEON:  Surgeon(s) and Role:    * Jalene Mullet, MD - Primary  PHYSICIAN ASSISTANT:   ASSISTANTS: none   ANESTHESIA:   local and general  EBL:  minimal   BLOOD ADMINISTERED:none  DRAINS: none   LOCAL MEDICATIONS USED:  MARCAINE    and LIDOCAINE   SPECIMEN:  No Specimen  DISPOSITION OF SPECIMEN:  N/A  COUNTS:  YES  TOURNIQUET:  * No tourniquets in log *  DICTATION: .Note written in EPIC  PLAN OF CARE: Discharge to home after PACU  PATIENT DISPOSITION:  PACU - hemodynamically stable.   Delay start of Pharmacological VTE agent (>24hrs) due to surgical blood loss or risk of bleeding: not applicable

## 2021-12-27 NOTE — Anesthesia Procedure Notes (Signed)
Procedure Name: Intubation Date/Time: 12/27/2021 4:36 PM  Performed by: Moshe Salisbury, CRNAPre-anesthesia Checklist: Patient identified, Emergency Drugs available, Suction available and Patient being monitored Patient Re-evaluated:Patient Re-evaluated prior to induction Oxygen Delivery Method: Circle System Utilized Preoxygenation: Pre-oxygenation with 100% oxygen Induction Type: IV induction Ventilation: Mask ventilation without difficulty Laryngoscope Size: Mac and 3 Grade View: Grade III Tube type: Oral Tube size: 7.5 mm Number of attempts: 1 Airway Equipment and Method: Stylet Placement Confirmation: ETT inserted through vocal cords under direct vision, positive ETCO2 and breath sounds checked- equal and bilateral Secured at: 21 cm Tube secured with: Tape Dental Injury: Teeth and Oropharynx as per pre-operative assessment

## 2021-12-28 ENCOUNTER — Encounter (HOSPITAL_COMMUNITY): Payer: Self-pay | Admitting: Ophthalmology

## 2021-12-28 ENCOUNTER — Other Ambulatory Visit: Payer: Self-pay

## 2021-12-28 MED ORDER — PREDNISOLONE ACETATE 1 % OP SUSP
OPHTHALMIC | 0 refills | Status: AC
  Filled 2021-12-28: qty 5, 28d supply, fill #0

## 2021-12-28 MED ORDER — OFLOXACIN 0.3 % OP SOLN
1.0000 [drp] | Freq: Four times a day (QID) | OPHTHALMIC | 0 refills | Status: DC
  Filled 2021-12-28: qty 5, 25d supply, fill #0

## 2021-12-28 NOTE — Anesthesia Postprocedure Evaluation (Signed)
Anesthesia Post Note  Patient: Jennifer Hogan  Procedure(s) Performed: PARS PLANA VITRECTOMY 27 GAUGE (Right: Eye) ENDO LASER (Right: Eye) AIR/FLUID EXCHANGE (Right: Eye)     Patient location during evaluation: PACU Anesthesia Type: General Level of consciousness: awake and alert Pain management: pain level controlled Vital Signs Assessment: post-procedure vital signs reviewed and stable Respiratory status: spontaneous breathing, nonlabored ventilation and respiratory function stable Cardiovascular status: blood pressure returned to baseline and stable Postop Assessment: no apparent nausea or vomiting Anesthetic complications: no   No notable events documented.  Last Vitals:  Vitals:   12/27/21 1815 12/27/21 1829  BP: 123/84 126/69  Pulse: 71 71  Resp: 11 16  Temp:  36.8 C  SpO2: 97% 97%    Last Pain:  Vitals:   12/27/21 1829  TempSrc:   PainSc: 0-No pain                 Audry Pili

## 2021-12-29 NOTE — Psych (Signed)
Virtual Visit via Video Note  I connected with Jennifer Hogan on 09/16/21 at  9:05 AM EDT by a video enabled telemedicine application and verified that I am speaking with the correct person using two identifiers.  Location: Patient: patient home Provider: clinical home office   I discussed the limitations of evaluation and management by telemedicine and the availability of in person appointments. The patient expressed understanding and agreed to proceed.   I discussed the assessment and treatment plan with the patient. The patient was provided an opportunity to ask questions and all were answered. The patient agreed with the plan and demonstrated an understanding of the instructions.   The patient was advised to call back or seek an in-person evaluation if the symptoms worsen or if the condition fails to improve as anticipated.  Pt was provided 240 minutes of non-face-to-face time during this encounter.   Lorin Glass, LCSW   [facility] PHP THERAPIST PROGRESS NOTE  Jennifer Hogan [id number]  Session Time: 9:00 - 10:00  Participation Level: Active  Behavioral Response: CasualAlertDepressed  Type of Therapy: Group Therapy  Treatment Goals addressed: Coping  Progress Towards Goals: Progressing  Interventions: CBT, DBT, Supportive, and Reframing  Summary: Clinician led check-in regarding current stressors and situation, and review of patient completed daily inventory. Clinician utilized active listening and empathetic response and validated patient emotions. Clinician facilitated processing group on pertinent issues.?    Summary: Jennifer Hogan is a 51 y.o. female who presents with depression and anxiety symptoms. Patient arrived within time allowed. Patient rates her mood at a 2 on a scale of 1-10 with 10 being best. Pt states feeling "not good." Pt is tearful and noticeably upset. Pt reports external factors are "piling up" and her financial situation is "horrible" which  is increasing her depression and anxiety symptoms. Pt reports increased hopelessness. Patient able to process. Patient engaged in discussion.         Session Time: 10:00 am - 11:00 am   Participation Level: Active   Behavioral Response: CasualAlertDepressed   Type of Therapy: Group Therapy   Treatment Goals addressed: Coping   Progress Towards Goals: Progressing   Interventions: CBT, DBT, Solution Focused, Strength-based, Supportive, and Reframing   Therapist Response: Cln introduced topic of boundaries. Cln discussed how boundaries inform our relationships and affect self-esteem and personal agency. Group discussed the three types of boundaries: rigid, porous, and healthy and when each type is most helpful/harmful.    Therapist Response: Pt engaged in discussion and identified boundary issues.         Session Time: 11:00 -12:00   Participation Level: Active   Behavioral Response: CasualAlertDepressed   Type of Therapy: Group Therapy   Treatment Goals addressed: Coping   Progress Towards Goals: Progressing   Interventions: CBT, DBT, Solution Focused, Strength-based, Supportive, and Reframing   Summary: Cln led discussion on "why" and "what now" in terms of how we focus on our problems. Group members shared how focus on "why" has impacted them and barriers to focusing on "what now." Group able to process. Cln encouraged pt's to consider what "why" accomplishes.    Therapist Response: Pt engaged in discussion and reports they tend to focus on why as a way to resolve the issue.        Session Time: 12:00 -1:00   Participation Level: Active   Behavioral Response: CasualAlertDepressed   Type of Therapy: Group therapy, Occupational Therapy   Treatment Goals addressed: Coping   Progress Towards Goals:  Progressing   Interventions: Supportive; Psychoeducation   Summary: 12:00 - 12:50: Occupational Therapy group led by cln E. Hollan. 12:50 - 1:00 Clinician assessed  for immediate needs, medication compliance and efficacy, and safety concerns.   Therapist Response: 12:00 - 12:50: Pt engaged 12:50 - 1:00 pm: At check-out, patient reports no immediate concerns. Patient demonstrates progress as evidenced by increased sense of worth. Patient denies SI/HI/self-harm thoughts at the end of group.    Suicidal/Homicidal: Nowithout intent/plan  Plan: Pt will continue in PHP while working to decrease depression and anxiety symptoms, increase ADLs, and increase ability to manage symptoms in a healthy manner.   Collaboration of Care: Medication Management AEB V [place]  Patient/Guardian was advised Release of Information must be obtained prior to any record release in order to collaborate their care with an outside provider. Patient/Guardian was advised if they have not already done so to contact the registration department to sign all necessary forms in order for [name] to release information regarding their care.   Consent: Patient/Guardian gives verbal consent for treatment and assignment of benefits for services provided during this visit. Patient/Guardian expressed understanding and agreed to proceed.   Diagnosis: Severe episode of recurrent major depressive disorder, without psychotic features ([place]) [F33.2]    1. Severe episode of recurrent major depressive disorder, without psychotic features ([place])   2. GAD (generalized anxiety disorder)      Lorin Glass, LCSW

## 2022-01-02 NOTE — Patient Instructions (Signed)
Thank you for attending your appointment today.  -- We did not make any medication changes today. Please continue medications as prescribed.  Please do not make any changes to medications without first discussing with your provider. If you are experiencing a psychiatric emergency, please call 911 or present to your nearest emergency department. Additional crisis, medication management, and therapy resources are included below.  [facility]  [address], [place], [place] [address] [phone] WALK-IN URGENT CARE 24/7 FOR ANYONE [address], [place], [place]  [phone] Fax: [phone] [email or web address] *Interpreters available *Accepts all insurance and uninsured for Urgent Care needs *Accepts Medicaid and uninsured for outpatient treatment (below)      ONLY FOR [place] Residents  Below:    Outpatient New Patient Assessment/Therapy Walk-ins:        Monday -Thursday 8am until slots are full.        Every Friday 1pm-4pm  (first come, first served)                   New Patient Psychiatry/Medication Management        Monday-Friday 8am-11am (first come, first served)               For all walk-ins we ask that you arrive by 7:15am, because patients will be seen in the order of arrival.   

## 2022-01-02 NOTE — Progress Notes (Unsigned)
MD [facility] MD Outpatient Progress Note  01/03/2022 9:28 AM Jennifer Hogan  MRN:  [id number]  Assessment:  Jennifer Hogan presents for follow-up evaluation. Today, 01/03/22, patient reports she tolerated initiation of Cymbalta well and identifies benefit from combination of Cymbalta and Wellbutrin for mood, anxiety, and irritability.  Notes some improvement in pain since starting Cymbalta as well although notes this may also be related to decreased use of cannabis; patient endorses desire to continue decreasing use and was commended in this effort.  No recent SI and identifies increased enjoyment from part-time work.  She has intake scheduled with [facility] for therapy.  No changes to plan of care at this time.  Plan to RTC in 2 months; plan for coverage while this writer is on leave was discussed.   Identifying Information: Jennifer Hogan is a 51 y.o. female with a history of MDD, GAD, childhood trauma, T1DM with diabetic retinopathy and on insulin pump, significant coronary artery disease and CFH Stage 3 (s/p MI 2019, s/p 3 vessel CABG 10/2018, s/p multiple NSTEMIs), and hypothyroidism who is an established patient with [facility] participating in follow-up via video conferencing. Patient was recently discharged from [facility] partial hospitalization program (participated 08/25/21-09/20/21) for management of worsening anxiety and depression. During that time, she participated in therapy and started on Wellbutrin, noting significant benefit from [facility] program for mood and anxiety and demonstrating substantial insight on exam to contributors to mental health symptoms.   Plan:  # Major depressive disorder  Generalized anxiety disorder Past medication trials: Prozac 40 mg daily (ineffective), Effexor XR Status of problem: improving Interventions: -- Continue Cymbalta 20 mg daily (s11/3/23) -- Continue Wellbutrin XL 150 mg daily (significant irritability at  300 mg dose) -- Continue Atarax 25 mg TID PRN panic attacks (has been using infrequently)  -- Patient reports she has intake scheduled with [facility] for therapy    # Cannabis use Status of problem: improving Interventions: -- Continue to monitor use and provide psychoeducation   # History of alcohol use Past medication trials: None Status of problem: In remission Interventions: -- Patient reports social use but denies risky or escalating drinking behaviors; continue to monitor and support sobriety  Patient was given contact information for behavioral health clinic and was instructed to call 911 for emergencies.   Subjective:  Chief Complaint:  Chief Complaint  Patient presents with   Medication Management    Interval History:   Patient reports she likes this medication combination and tolerated initiation of Cymbalta well. Denies adverse effects. Endorses improvement in mood and pain. Has stopped using cannabis daily and wonders if this may also be related to improvement in pain. Reports less irritability and anxiety.   Has an appointment set up with [facility] in order to get established with therapy. Continues to work part-time tutoring - notes improvement in stress at work.   Denies passive/active SI recently - reports improvement since starting Cymbalta and working more. Continues to think about dad's death and relationship with mom but feels she is processing these things differently now and leans on tips provided in PHP.   Amenable to continuing medications as prescribed. All questions/concerns addressed.  Visit Diagnosis:    ICD-10-CM   1. GAD (generalized anxiety disorder)  F41.1 DULoxetine (CYMBALTA) 20 MG capsule    hydrOXYzine (ATARAX) 25 MG tablet    2. Moderate episode of recurrent major depressive disorder (HCC)  F33.1 DULoxetine (CYMBALTA) 20 MG capsule  hydrOXYzine (ATARAX) 25 MG tablet     Past Psychiatric History:   Diagnoses: depression, anxiety Past medication trials: Prozac, Effexor XR Suicide attempts: denies Hospitalizations: denies Therapy: yes; past in 2001 and currently Substance use:  -- Cannabis: last used a few days ago; using approx. 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 Attended a mandated 2 week outpatient recovery program.   Past Medical History:  Past Medical History:  Diagnosis Date   Allergy    Anemia    Anxiety    Asthma    Cataract    CHF (congestive heart failure) ([place])    Coronary artery disease    a. s/p PTCA DES x3 in the LAD (ostial DES placed, mid DES placed, distal DES placed) and PTCA/DES x1 to intermediate branch 02/2017. b. ACS 02/08/18 with LCx stent placement.   Depression    Diabetes mellitus    type 1   DKA (diabetic ketoacidoses) 12/31/2017   Dyspnea    with exertion and dust, no oxygen   Elevated platelet count    GERD (gastroesophageal reflux disease)    Heart murmur    never caused any problems   High cholesterol    Hypothyroidism    Ischemic cardiomyopathy    a. EF low-normal with basal inferior akinesis, basal septal hypokinesis by echo 01/2018.   Myocardial infarction ([place]) 2019   Nausea and vomiting 12/04/2010   Substance abuse ([place])    marijuana    Past Surgical History:  Procedure Laterality Date   25 GAUGE PARS PLANA VITRECTOMY WITH 20 GAUGE MVR PORT Right 11/24/2021   Procedure: 25 GAUGE PARS PLANA VITRECTOMY APPROACH RIGHT EYE, FLUID FLUID EXCHANGE;  Surgeon: Jalene Mullet, MD;  Location: [facility];  Service: Ophthalmology;  Laterality: Right;   AIR/FLUID EXCHANGE Right 12/27/2021   Procedure: AIR/FLUID EXCHANGE;  Surgeon: Jalene Mullet, MD;  Location: [facility];  Service:  Ophthalmology;  Laterality: Right;   APPENDECTOMY     BIOPSY  09/29/2021   Procedure: BIOPSY;  Surgeon: Yetta Flock, MD;  Location: [facility];  Service: Gastroenterology;;   CATARACT EXTRACTION W/PHACO Right 11/24/2021   Procedure: CATARACT EXTRACTION AND INTRAOCULAR LENS PLACEMENT ([place]) RIGHT EYE;  Surgeon: Jalene Mullet, MD;  Location: [facility];  Service: Ophthalmology;  Laterality: Right;   COLONOSCOPY     COLONOSCOPY WITH PROPOFOL N/A 09/29/2021   Procedure: COLONOSCOPY WITH PROPOFOL;  Surgeon: Yetta Flock, MD;  [facility];  Service: Gastroenterology;  Laterality: N/A;   CORONARY ARTERY BYPASS GRAFT N/A 11/04/2018   Procedure: CORONARY ARTERY BYPASS GRAFTING (CABG), ON PUMP, TIMES THREE, USING LEFT AND RIGHT INTERNAL MAMMARY ARTERIES;  Surgeon: Wonda Olds, MD;  Location: [facility];  Service: Open Heart Surgery;  Laterality: N/A;   CORONARY STENT INTERVENTION N/A 03/20/2017   Procedure: DES x 3 LAD, DES OM; Surgeon: Burnell Blanks, MD;  Location: [facility];  Service: Cardiovascular;  Laterality: N/A;   CORONARY/GRAFT ACUTE MI REVASCULARIZATION N/A 02/08/2018   Procedure: Coronary/Graft Acute MI Revascularization;  Surgeon: Belva Crome, MD;  Location: [facility];  Service: Cardiovascular;  Laterality: N/A;   ESOPHAGOGASTRODUODENOSCOPY (EGD) WITH PROPOFOL N/A 09/29/2021   Procedure: ESOPHAGOGASTRODUODENOSCOPY (EGD) WITH PROPOFOL;  Surgeon: Yetta Flock, MD;  [facility];  Service: Gastroenterology;  Laterality: N/A;   GAS INSERTION Right 04/28/2021   Procedure: INSERTION OF GAS ( C3F8);  Surgeon: Jalene Mullet, MD;  Location: [facility];  Service: Ophthalmology;  Laterality: Right;  Right eye   GAS/FLUID EXCHANGE Right 04/28/2021   Procedure: GAS/FLUID EXCHANGE;  Surgeon: Jalene Mullet, MD;  Location: [facility];  Service: Ophthalmology;  Laterality: Right;  Right eye   LASER PHOTO ABLATION Right 11/24/2021   Procedure: ENDO  LASER PAN PHOTOCOAGULATION;  Surgeon: Jalene Mullet, MD;  Location: [facility];  Service: Ophthalmology;  Laterality: Right;   LASER PHOTO ABLATION Right 12/27/2021   Procedure: ENDO LASER;  Surgeon: Jalene Mullet, MD;  Location: [facility];  Service: Ophthalmology;  Laterality: Right;   LEFT HEART CATH AND CORONARY ANGIOGRAPHY N/A 03/20/2017   Procedure: LEFT HEART CATH AND CORONARY ANGIOGRAPHY;  Surgeon: Burnell Blanks, MD;  Location: [facility];  Service: Cardiovascular;  Laterality: N/A;   LEFT HEART CATH AND CORONARY ANGIOGRAPHY N/A 02/08/2018   Procedure: LEFT HEART CATH AND CORONARY ANGIOGRAPHY;  Surgeon: Belva Crome, MD;  Location: [facility];  Service: Cardiovascular;  Laterality: N/A;   LEFT HEART CATH AND CORONARY ANGIOGRAPHY N/A 10/29/2018   Procedure: LEFT HEART CATH AND CORONARY ANGIOGRAPHY;  Surgeon: Burnell Blanks, MD;  Location: [facility];  Service: Cardiovascular;  Laterality: N/A;   LEFT HEART CATH AND CORS/GRAFTS ANGIOGRAPHY N/A 10/04/2020   Procedure: LEFT HEART CATH AND CORS/GRAFTS ANGIOGRAPHY;  Surgeon: Sherren Mocha, MD;  Location: [facility];  Service: Cardiovascular;  Laterality: N/A;   LEFT HEART CATH AND CORS/GRAFTS ANGIOGRAPHY N/A 05/11/2021   Procedure: LEFT HEART CATH AND CORS/GRAFTS ANGIOGRAPHY;  Surgeon: Martinique, Peter M, MD;  Location: [facility];  Service: Cardiovascular;  Laterality: N/A;   PARS PLANA VITRECTOMY Right 04/28/2021   Procedure: PARS PLANA VITRECTOMY WITH 25 GAUGE REMOVAL OF TRATIONAL MEMBRANE RIGHT EYE; DRAINAGE OF SUBRETINAL FLUID RIGHT EYE;  Surgeon: Jalene Mullet, MD;  Location: [facility];  Service: Ophthalmology;  Laterality: Right;  Right eye   PARS PLANA VITRECTOMY 27 GAUGE Right 12/27/2021   Procedure: PARS PLANA VITRECTOMY 27 GAUGE;  Surgeon: Jalene Mullet, MD;  Location: [facility];  Service: Ophthalmology;  Laterality: Right;   PHOTOCOAGULATION WITH LASER Right 04/28/2021   Procedure:  PHOTOCOAGULATION WITH LASER;  Surgeon: Jalene Mullet, MD;  Location: [facility];  Service: Ophthalmology;  Laterality: Right;  Right eye   POLYPECTOMY  09/29/2021   Procedure: POLYPECTOMY;  Surgeon: Yetta Flock, MD;  [facility];  Service: Gastroenterology;;   TEE WITHOUT CARDIOVERSION N/A 11/04/2018   Procedure: TRANSESOPHAGEAL ECHOCARDIOGRAM (TEE);  Surgeon: Wonda Olds, MD;  Location: [facility];  Service: Open Heart Surgery;  Laterality: N/A;    Family Psychiatric History: Father: etoh use disorder, anxiety Grandmother: depression, was treated with ECT later in her life  Family History:  Family History  Problem Relation Age of Onset   Cancer Mother        T cell lymphoma   Hypertension Mother    Hypercalcemia Mother    OCD Father    Anxiety disorder Father    Coronary artery disease Father    Congestive Heart Failure Father    Asthma Father    Hypercalcemia Father    Hypertension Father    Colon polyps Father    Depression Maternal Grandmother    Diabetes Paternal Grandmother    Stomach cancer Neg Hx    Esophageal cancer  Neg Hx    Colon cancer Neg Hx    Rectal cancer Neg Hx     Social History:  Social History   Socioeconomic History   Marital status: Divorced    Spouse name: Not on file   Number of children: 0   Years of education: Not on file   Highest education level: Professional school degree (e.g., MD, DDS, DVM, JD)  Occupational History   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 3 times weekly; reports decreasing use    Sexual activity: Not Currently    Birth control/protection: None  Other Topics Concern   Not on file  Social History Narrative   Not on file   Social Determinants of Health   Financial Resource Strain: High Risk (10/05/2020)   Overall Financial Resource Strain (CARDIA)    Difficulty of Paying Living Expenses: Very hard  Food Insecurity: Food Insecurity Present (10/05/2020)   Hunger Vital Sign    Worried About Running Out of Food in the Last Year: Sometimes true    Ran Out of Food in the Last Year: Sometimes true  Transportation Needs: No Transportation Needs (10/05/2020)   PRAPARE - Hydrologist (Medical): No    Lack of Transportation (Non-Medical): No  Physical Activity: Not on file  Stress: Not on file  Social Connections: Unknown (12/07/2017)   Social Connection and Isolation Panel [NHANES]    Frequency of Communication with Friends and Family: Patient refused    Frequency of Social Gatherings with Friends and Family: Patient refused    Attends Religious Services: Patient refused    Active Member of Clubs or Organizations: Patient refused    Attends Archivist Meetings: Patient refused    Marital Status: Patient refused    Allergies:  Allergies  Allergen Reactions   Atorvastatin Other (See Comments)    Myalgias    Crestor [Rosuvastatin Calcium] Other (See Comments)    Severe myalgias and joint pain. Has tolerated atorvastatin though   Monosodium Glutamate Nausea And Vomiting and Other (See Comments)    MSG - migraine   Zetia [Ezetimibe] Other (See Comments)    myalgias   Ciprofloxin Hcl [Ciprofloxacin] Nausea And Vomiting and Other (See Comments)    Severe migraine   Food Color Red [Red Dye] Diarrhea    Current Medications: Current Outpatient Medications  Medication Sig Dispense Refill   acetaminophen (TYLENOL) 325 MG tablet Take 650 mg by mouth daily as needed for mild pain, fever or headache.     albuterol (VENTOLIN HFA)  108 (90 Base) MCG/ACT inhaler INHALE 2 PUFFS INTO THE LUNGS EVERY 6 (SIX) HOURS AS NEEDED FOR WHEEZING OR SHORTNESS OF BREATH. 18 g 2   Alirocumab (PRALUENT) 150 MG/ML SOAJ Inject 1 pen  into the skin every 14 (fourteen) days. 2 mL 11   aspirin 81 MG EC tablet Take 1 tablet (81 mg total) by mouth daily. 90 tablet 0   b  complex vitamins tablet Take 1 tablet by mouth 3 (three) times a week.     budesonide (PULMICORT) 0.25 MG/2ML nebulizer solution Take 0.25 mg by nebulization daily as needed (shortness of breath/wheezing).     buPROPion (WELLBUTRIN XL) 150 MG 24 hr tablet Take 1 tablet (150 mg total) by mouth every morning. 30 tablet 2   clopidogrel (PLAVIX) 75 MG tablet Take 1 tablet (75 mg total) by mouth daily with breakfast. 90 tablet 2   diclofenac Sodium (VOLTAREN) 1 % GEL Apply 2-4 g topically 3 (three) times daily as needed (knee pain).     diphenhydrAMINE (BENADRYL) 25 MG tablet Take 37.5 mg by mouth at bedtime.     DULoxetine (CYMBALTA) 20 MG capsule Take 1 capsule (20 mg total) by mouth daily. 30 capsule 2   erythromycin ophthalmic ointment Apply 1 cm twice daily to right eye for 1 week. (Patient not taking: Reported on 12/26/2021) 3.5 g 0   fluticasone (FLONASE) 50 MCG/ACT nasal spray Place 1 spray into both nostrils at bedtime as needed for allergies.     furosemide (LASIX) 20 MG tablet Take 1 tablet (20 mg total) by mouth daily as needed (for weight gain). 15 tablet 3   Glucagon 3 MG/DOSE POWD Place 3 mg into the nose once as needed for up to 1 dose. 1 each 11   hydrOXYzine (ATARAX) 25 MG tablet Take 1 tablet (25 mg total) by mouth 3 (three) times daily as needed for anxiety. 90 tablet 2   insulin lispro (HUMALOG) 100 UNIT/ML injection Inject 0.35 mLs (35 Units total) into the skin daily via pump. 10 mL 1   levothyroxine (SYNTHROID) 100 MCG tablet Take 100 mcg by mouth daily before breakfast.     losartan (COZAAR) 25 MG tablet Take 0.5 tablets (12.5 mg total) by mouth daily. 45 tablet 3    methylcellulose (CITRUCEL) oral powder Take 1 packet by mouth daily.     metoCLOPramide (REGLAN) 5 MG tablet Take 1 tablet (5 mg total) by mouth every 8 (eight) hours as needed for nausea or vomiting. 60 tablet 3   metoprolol succinate (TOPROL-XL) 25 MG 24 hr tablet Take 0.5 tablets (12.5 mg total) by mouth daily. 15 tablet 6   montelukast (SINGULAIR) 10 MG tablet Take 1 tablet (10 mg total) by mouth at bedtime. 90 tablet 2   Multiple Vitamin (MULTIVITAMIN WITH MINERALS) TABS tablet Take 1 tablet by mouth daily.     nitroGLYCERIN (NITROSTAT) 0.4 MG SL tablet Place 1 tablet (0.4 mg total) under the tongue every 5 (five) minutes as needed for chest pain. 25 tablet 6   ofloxacin (OCUFLOX) 0.3 % ophthalmic solution Place 1 drop into the right eye 4 (four) times daily for 7 days. 5 mL 0   omeprazole (PRILOSEC OTC) 20 MG tablet Take 20 mg by mouth daily as needed (acid reflux).     OVER THE COUNTER MEDICATION Take 3 capsules by mouth daily before breakfast. [employer] Super Lysine Immune Support     prednisoLONE acetate (PRED FORTE) 1 % ophthalmic suspension Place 1 drop into the right eye 4 (four) times daily for 7 days, THEN 1 drop 3 (three) times daily for 7 days, THEN 1 drop 2 (two) times daily for 7 days, THEN 1 drop daily for 7 days. 10 mL 0   spironolactone (ALDACTONE) 25 MG tablet Take 12.5 mg by mouth daily.     No current facility-administered medications for this visit.    ROS: Endorses generalized physical aches/pains,  diabetic neuropathic pain.   Objective:  Psychiatric Specialty Exam: There were no vitals taken for this visit.There is no height or weight on file to calculate BMI.  General Appearance: Casual and Well Groomed  Eye Contact:  Good  Speech:  Clear and Coherent and Normal Rate  Volume:  Normal  Mood:   "better"  Affect:  Full Range and Euthymic and Engaged  Thought Process:  Goal Directed and Linear  Orientation:  Full (Time, Place, and Person)  Thought  Content: Denies hallucinations and no overt delusional content on interview  Suicidal Thoughts:   Denies passive/active SI  Homicidal Thoughts:  No  Memory:   Grossly intact  Judgment:  Good  Insight:  Good  Psychomotor Activity:  Normal  Concentration:  Concentration: Grossly intact to interview  Recall:  NA  Fund of Knowledge: Good  Language: Good  Akathisia:  NA  Handed:  n/a  AIMS (if indicated): not done  Assets:  Communication Skills Desire for Improvement Financial Resources/Insurance Housing Resilience Talents/Skills Vocational/Educational  ADL's:  Intact  Cognition: WNL  Sleep:  Good   PE: General: sits comfortably in view of camera; no acute distress  Pulm: no increased work of breathing on room air  MSK: all extremity movements appear intact  Neuro: no focal neurological deficits observed  Gait & Station: unable to assess by video    Metabolic Disorder Labs: Lab Results  Component Value Date   HGBA1C 8.0 (A) 07/01/2021   MPG 197.25 10/01/2020   MPG 231.69 03/15/2019   No results found for: "PROLACTIN" Lab Results  Component Value Date   CHOL 298 (H) 08/15/2021   TRIG 111 08/15/2021   HDL 76 08/15/2021   CHOLHDL 3.9 08/15/2021   VLDL 13 05/11/2021   LDLCALC 203 (H) 08/15/2021   LDLCALC 167 (H) 05/11/2021   Lab Results  Component Value Date   TSH 2.505 05/15/2021   TSH 0.737 03/25/2021    Therapeutic Level Labs: No results found for: "LITHIUM" No results found for: "VALPROATE" No results found for: "CBMZ"  Screenings: GAD-7    Flowsheet Row Office Visit from 11/28/2021 in [place] for [facility] at [facility] Office Visit from 09/28/2021 in [place] Counselor from 08/24/2021 in [facility] Office Visit from 06/27/2021 in [place] from 05/22/2019 in [place]  Total GAD-7  Score '12 18 17 18 14      '$ PHQ2-9    [place] Office Visit from 11/28/2021 in [facility] Counselor from 11/24/2021 in [facility] Office Visit from 09/28/2021 in [place] Counselor from 09/13/2021 in [facility] Counselor from 09/05/2021 in [place]  PHQ-2 Total Score '3 6 3 3 3  '$ PHQ-9 Total Score '15 16 14 17 14      '$ Flowsheet Row Admission (Discharged) from 12/27/2021 in [facility] Most recent reading at 12/27/2021  1:24 PM Admission (Discharged) from 11/24/2021 in [facility] Most recent reading at 11/24/2021 11:09 AM Counselor from 11/24/2021 in [facility] Most recent reading at 11/24/2021  8:20 AM  C-SSRS RISK CATEGORY No Risk No Risk No Risk       Collaboration of Care: Collaboration of Care: Medication Management AEB active medication changes and Psychiatrist AEB established with this  provider  Patient/Guardian was advised Release of Information must be obtained prior to any record release in order to collaborate their care with an outside provider. Patient/Guardian was advised if they have not already done so to contact the registration department to sign all necessary forms in order for [name] to release information regarding their care.   Consent: Patient/Guardian gives verbal consent for treatment and assignment of benefits for services provided during this visit. Patient/Guardian expressed understanding and agreed to proceed.   Televisit via video: I connected with patient on 01/03/22 at  8:30 AM EST by a video enabled telemedicine application and verified that I am speaking with the correct person using two identifiers.  Location: Patient: home in [place]  Provider: clinic   I discussed the limitations of evaluation and management by telemedicine  and the availability of in person appointments. The patient expressed understanding and agreed to proceed.  I discussed the assessment and treatment plan with the patient. The patient was provided an opportunity to ask questions and all were answered. The patient agreed with the plan and demonstrated an understanding of the instructions.   The patient was advised to call back or seek an in-person evaluation if the symptoms worsen or if the condition fails to improve as anticipated.  I provided 30 minutes of non-face-to-face time during this encounter.  Charnette Younkin A  01/03/2022, 9:28 AM

## 2022-01-03 ENCOUNTER — Other Ambulatory Visit: Payer: Self-pay

## 2022-01-03 ENCOUNTER — Telehealth (INDEPENDENT_AMBULATORY_CARE_PROVIDER_SITE_OTHER): Payer: Medicaid Other | Admitting: Psychiatry

## 2022-01-03 ENCOUNTER — Encounter (HOSPITAL_COMMUNITY): Payer: Self-pay | Admitting: Psychiatry

## 2022-01-03 DIAGNOSIS — F411 Generalized anxiety disorder: Secondary | ICD-10-CM

## 2022-01-03 DIAGNOSIS — F331 Major depressive disorder, recurrent, moderate: Secondary | ICD-10-CM

## 2022-01-03 MED ORDER — DULOXETINE HCL 20 MG PO CPEP
20.0000 mg | ORAL_CAPSULE | Freq: Every day | ORAL | 2 refills | Status: DC
  Filled 2022-01-03 – 2022-01-24 (×3): qty 30, 30d supply, fill #0
  Filled 2022-03-03: qty 30, 30d supply, fill #1

## 2022-01-03 MED ORDER — BUPROPION HCL ER (XL) 150 MG PO TB24
150.0000 mg | ORAL_TABLET | ORAL | 2 refills | Status: DC
  Filled 2022-01-03: qty 30, 30d supply, fill #0
  Filled 2022-01-26: qty 30, 30d supply, fill #1

## 2022-01-03 MED ORDER — HYDROXYZINE HCL 25 MG PO TABS
25.0000 mg | ORAL_TABLET | Freq: Three times a day (TID) | ORAL | 2 refills | Status: AC | PRN
  Filled 2022-01-03: qty 90, 30d supply, fill #0
  Filled 2022-01-26: qty 90, 30d supply, fill #1

## 2022-01-04 ENCOUNTER — Ambulatory Visit (INDEPENDENT_AMBULATORY_CARE_PROVIDER_SITE_OTHER): Payer: Medicaid Other | Admitting: Internal Medicine

## 2022-01-04 ENCOUNTER — Other Ambulatory Visit: Payer: Self-pay

## 2022-01-04 ENCOUNTER — Encounter: Payer: Self-pay | Admitting: Internal Medicine

## 2022-01-04 VITALS — BP 100/62 | HR 88 | Ht 65.0 in | Wt 153.4 lb

## 2022-01-04 DIAGNOSIS — E039 Hypothyroidism, unspecified: Secondary | ICD-10-CM

## 2022-01-04 DIAGNOSIS — E1059 Type 1 diabetes mellitus with other circulatory complications: Secondary | ICD-10-CM

## 2022-01-04 LAB — POCT GLYCOSYLATED HEMOGLOBIN (HGB A1C): Hemoglobin A1C: 8 % — AB (ref 4.0–5.6)

## 2022-01-04 MED ORDER — DEXCOM G7 SENSOR MISC
3.0000 | 4 refills | Status: DC
  Filled 2022-01-04: qty 3, 30d supply, fill #0
  Filled 2022-02-08: qty 3, 30d supply, fill #1
  Filled 2022-03-08: qty 3, 30d supply, fill #2
  Filled 2022-04-10: qty 3, 30d supply, fill #3
  Filled 2022-05-15: qty 3, 30d supply, fill #4
  Filled 2022-06-02: qty 3, 30d supply, fill #5
  Filled ????-??-??: fill #5

## 2022-01-04 NOTE — Patient Instructions (Addendum)
Please use the following pump settings: - basal rates: 12 am: 0.650 units/h 6 am: 0.675 10 am: 0.700 - ICR: 1:16 - target: 100-120 - ISF: 65 - Insulin on Board: 4h  Look up: -Omnipod5 -t:slim X2 -iLet  Start the Dexcom G7 CGM.  Please do the following approximately 15 minutes before every meal: - Enter carbs (C) - Enter sugars (S) - Start insulin bolus (I)  Please continue Levothyroxine 100 mcg daily.  Take the thyroid hormone every day, with water, at least 30 minutes before breakfast, separated by at least 4 hours from: - acid reflux medications - calcium - iron - multivitamins  Please return in 3-4 months.

## 2022-01-04 NOTE — Progress Notes (Signed)
[name], female   DOB: July 04, 1970, 51 y.o.   MRN: [id number]  HPI: Jennifer Hogan is a 51 y.o.-year-old female, returning for follow-up for DM1, diagnosed in 1984, uncontrolled, with complications: CAD, s/p AMI, s/p PTCA and DES, ischemic cardiomyopathy, DKA, gastroparesis.  She previously saw Dr. Loanne Drilling, last visit with him 11/2020 (video).  I first saw the patient in 06/2021, 6 months ago.  Interim history: She has abdominal pain and vomiting - no nausea. On Reglan daily.  She saw GI and a gastric emptying study obtained on 12/02/2021 was normal.   She has increased urination as she drinks a lot of water.  She also has blurry vision (diabetic retinopathy and cataract in the right eye) >> had an IO injection earlier today.  Reviewed HbA1c levels: Lab Results  Component Value Date   HGBA1C 8.0 (A) 07/01/2021   HGBA1C 8.7 (A) 03/25/2021   HGBA1C 8.5 (A) 10/11/2020   Insulin pump:  -since 2014 -Medtronic  CGM: -The Medtronic sensor was not affordable/accurate for her  Insulin: -Humalog  Supplies: -Medtronic  Pump settings -changes made by her since last visit-bold: - basal rates: 12 am: 0.65 units/h 6 am: 0.675 10 am: 0.800 >> 0.700 10 pm: 0.625 >> 0.700 - ICR: 1:16 - target: 100-120 - ISF: 55 >> 65 - Insulin on Board: 4 h - bolus wizard: on - extended bolusing: not using - changes infusion site: q5-7 days TDD from basal insulin: 60% >> 62% TDD from bolus insulin: 40% >> 38% Total daily dose: 25-50 units  Meter: Contour Next     Lowest sugar was 40 - at night >> 50s; she has hypoglycemia awareness at 70.  She does not have a glucagon kit at home. No previous hypoglycemia admission.  Highest sugar was 600 >> 300s. + previous DKA admissions: 2020, 2021, 06/27/2021 (CGM defective).  - no CKD, last BUN/creatinine:  Lab Results  Component Value Date   BUN 18 12/27/2021   BUN 15 11/24/2021   CREATININE 0.90 12/27/2021   CREATININE 0.90  11/24/2021   - + HL; last set of lipids: Lab Results  Component Value Date   CHOL 298 (H) 08/15/2021   HDL 76 08/15/2021   LDLCALC 203 (H) 08/15/2021   LDLDIRECT 109 (H) 12/22/2019   TRIG 111 08/15/2021   CHOLHDL 3.9 08/15/2021  She tried Lipitor >> joint pain, mm aches >> not taking it >> sees the lipid clinic - on Praluent.  - last eye exam was in 04/2021. + DR reportedly-need records.   - no numbness and tingling in her feet.  Last foot exam 10/08/2020.  Hypothyroidism:  Pt is on levothyroxine 100 mcg daily, taken: - in am - fasting - at least 60 min from b'fast - no calcium - no iron - no multivitamins - no PPIs - not on Biotin  Last TSH: Lab Results  Component Value Date   TSH 2.505 05/15/2021   She also has acid reflux.   ROS: + See HPI  Past Medical History:  Diagnosis Date   Allergy    Anemia    Anxiety    Asthma    Cataract    CHF (congestive heart failure) (HCC)    Coronary artery disease    a. s/p PTCA DES x3 in the LAD (ostial DES placed, mid DES placed, distal DES placed) and PTCA/DES x1 to intermediate branch 02/2017. b. ACS 02/08/18 with LCx stent placement.   Depression    Diabetes mellitus  type 1   DKA (diabetic ketoacidoses) 12/31/2017   Dyspnea    with exertion and dust, no oxygen   Elevated platelet count    GERD (gastroesophageal reflux disease)    Heart murmur    never caused any problems   High cholesterol    Hypothyroidism    Ischemic cardiomyopathy    a. EF low-normal with basal inferior akinesis, basal septal hypokinesis by echo 01/2018.   Myocardial infarction ([place]) 2019   Nausea and vomiting 12/04/2010   Substance abuse ([place])    marijuana   Past Surgical History:  Procedure Laterality Date   25 GAUGE PARS PLANA VITRECTOMY WITH 20 GAUGE MVR PORT Right 11/24/2021   Procedure: 25 GAUGE PARS PLANA VITRECTOMY APPROACH RIGHT EYE, FLUID FLUID EXCHANGE;  Surgeon: Jalene Mullet, MD;  Location: [place];  Service: Ophthalmology;   Laterality: Right;   AIR/FLUID EXCHANGE Right 12/27/2021   Procedure: AIR/FLUID EXCHANGE;  Surgeon: Jalene Mullet, MD;  Location: [place];  Service: Ophthalmology;  Laterality: Right;   APPENDECTOMY     BIOPSY  09/29/2021   Procedure: BIOPSY;  Surgeon: Yetta Flock, MD;  Location: [facility];  Service: Gastroenterology;;   CATARACT EXTRACTION W/PHACO Right 11/24/2021   Procedure: CATARACT EXTRACTION AND INTRAOCULAR LENS PLACEMENT ([place]) RIGHT EYE;  Surgeon: Jalene Mullet, MD;  Location: [place];  Service: Ophthalmology;  Laterality: Right;   COLONOSCOPY     COLONOSCOPY WITH PROPOFOL N/A 09/29/2021   Procedure: COLONOSCOPY WITH PROPOFOL;  Surgeon: Yetta Flock, MD;  Location: [facility];  Service: Gastroenterology;  Laterality: N/A;   CORONARY ARTERY BYPASS GRAFT N/A 11/04/2018   Procedure: CORONARY ARTERY BYPASS GRAFTING (CABG), ON PUMP, TIMES THREE, USING LEFT AND RIGHT INTERNAL MAMMARY ARTERIES;  Surgeon: Wonda Olds, MD;  Location: [place];  Service: Open Heart Surgery;  Laterality: N/A;   CORONARY STENT INTERVENTION N/A 03/20/2017   Procedure: DES x 3 LAD, DES OM; Surgeon: Burnell Blanks, MD;  Location: [place] [facility];  Service: Cardiovascular;  Laterality: N/A;   CORONARY/GRAFT ACUTE MI REVASCULARIZATION N/A 02/08/2018   Procedure: Coronary/Graft Acute MI Revascularization;  Surgeon: Belva Crome, MD;  Location: [place] [facility];  Service: Cardiovascular;  Laterality: N/A;   ESOPHAGOGASTRODUODENOSCOPY (EGD) WITH PROPOFOL N/A 09/29/2021   Procedure: ESOPHAGOGASTRODUODENOSCOPY (EGD) WITH PROPOFOL;  Surgeon: Yetta Flock, MD;  [facility];  Service: Gastroenterology;  Laterality: N/A;   GAS INSERTION Right 04/28/2021   Procedure: INSERTION OF GAS ( C3F8);  Surgeon: Jalene Mullet, MD;  Location: [place];  Service: Ophthalmology;  Laterality: Right;  Right eye   GAS/FLUID EXCHANGE Right 04/28/2021   Procedure: GAS/FLUID EXCHANGE;  Surgeon:  Jalene Mullet, MD;  Location: [place];  Service: Ophthalmology;  Laterality: Right;  Right eye   LASER PHOTO ABLATION Right 11/24/2021   Procedure: ENDO LASER PAN PHOTOCOAGULATION;  Surgeon: Jalene Mullet, MD;  Location: [place];  Service: Ophthalmology;  Laterality: Right;   LASER PHOTO ABLATION Right 12/27/2021   Procedure: ENDO LASER;  Surgeon: Jalene Mullet, MD;  Location: [place];  Service: Ophthalmology;  Laterality: Right;   LEFT HEART CATH AND CORONARY ANGIOGRAPHY N/A 03/20/2017   Procedure: LEFT HEART CATH AND CORONARY ANGIOGRAPHY;  Surgeon: Burnell Blanks, MD;  Location: [place] [facility];  Service: Cardiovascular;  Laterality: N/A;   LEFT HEART CATH AND CORONARY ANGIOGRAPHY N/A 02/08/2018   Procedure: LEFT HEART CATH AND CORONARY ANGIOGRAPHY;  Surgeon: Belva Crome, MD;  Location: [place] [facility];  Service: Cardiovascular;  Laterality: N/A;   LEFT HEART CATH AND CORONARY ANGIOGRAPHY N/A 10/29/2018   Procedure: LEFT HEART CATH AND CORONARY ANGIOGRAPHY;  Surgeon: Burnell Blanks, MD;  Location: [place] [facility];  Service: Cardiovascular;  Laterality: N/A;   LEFT HEART CATH AND CORS/GRAFTS ANGIOGRAPHY N/A 10/04/2020   Procedure: LEFT HEART CATH AND CORS/GRAFTS ANGIOGRAPHY;  Surgeon: Sherren Mocha, MD;  Location: [place] [facility];  Service: Cardiovascular;  Laterality: N/A;   LEFT HEART CATH AND CORS/GRAFTS ANGIOGRAPHY N/A 05/11/2021   Procedure: LEFT HEART CATH AND CORS/GRAFTS ANGIOGRAPHY;  Surgeon: Martinique, Peter M, MD;  Location: [place] [facility];  Service: Cardiovascular;  Laterality: N/A;   PARS PLANA VITRECTOMY Right 04/28/2021   Procedure: PARS PLANA VITRECTOMY WITH 25 GAUGE REMOVAL OF TRATIONAL MEMBRANE RIGHT EYE; DRAINAGE OF SUBRETINAL FLUID RIGHT EYE;  Surgeon: Jalene Mullet, MD;  Location: [place];  Service: Ophthalmology;  Laterality: Right;  Right eye   PARS PLANA VITRECTOMY 27 GAUGE Right 12/27/2021   Procedure: PARS PLANA VITRECTOMY 27 GAUGE;   Surgeon: Jalene Mullet, MD;  Location: [place];  Service: Ophthalmology;  Laterality: Right;   PHOTOCOAGULATION WITH LASER Right 04/28/2021   Procedure: PHOTOCOAGULATION WITH LASER;  Surgeon: Jalene Mullet, MD;  Location: [place];  Service: Ophthalmology;  Laterality: Right;  Right eye   POLYPECTOMY  09/29/2021   Procedure: POLYPECTOMY;  Surgeon: Yetta Flock, MD;  Location[facility];  Service: Gastroenterology;;   TEE WITHOUT CARDIOVERSION N/A 11/04/2018   Procedure: TRANSESOPHAGEAL ECHOCARDIOGRAM (TEE);  Surgeon: Wonda Olds, MD;  Location: [place];  Service: Open Heart Surgery;  Laterality: N/A;   Social History   Socioeconomic History   Marital status: Divorced    Spouse name: Not on file   Number of children: 0   Years of education: Not on file   Highest education level: Professional school degree (e.g., MD, DDS, DVM, JD)  Occupational History   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 3 times weekly; reports decreasing use   Sexual activity: Not Currently    Birth control/protection: None  Other Topics Concern   Not on file  Social History Narrative   Not on file   Social Determinants of Health   Financial Resource Strain: High Risk (10/05/2020)   Overall Financial Resource Strain (CARDIA)    Difficulty of Paying Living Expenses: Very hard  Food Insecurity: Food Insecurity Present (10/05/2020)   Hunger Vital Sign    Worried About Running Out of Food in the Last Year: Sometimes true    Ran Out of Food in the Last Year: Sometimes true   Transportation Needs: No Transportation Needs (10/05/2020)   PRAPARE - Hydrologist (Medical): No    Lack of Transportation (Non-Medical): No  Physical Activity: Not on file  Stress: Not on file  Social Connections: Unknown (12/07/2017)   Social Connection and Isolation Panel [NHANES]    Frequency of Communication with Friends and Family: Patient  refused    Frequency of Social Gatherings with Friends and Family: Patient refused    Attends Religious Services: Patient refused    Active Member of Clubs or Organizations: Patient refused    Attends Archivist Meetings: Patient refused    Marital Status: Patient refused  Intimate Partner Violence: Not At Risk (12/07/2017)   Humiliation, Afraid, Rape, and Kick questionnaire    Fear of Current or Ex-Partner: No    Emotionally Abused: No    Physically Abused: No    Sexually Abused: No   Current Outpatient Medications on File Prior to Visit  Medication Sig Dispense Refill   acetaminophen (TYLENOL) 325 MG tablet Take 650 mg by mouth daily as needed for mild pain, fever or headache.     albuterol (VENTOLIN HFA) 108 (90 Base) MCG/ACT inhaler INHALE 2 PUFFS INTO THE LUNGS EVERY 6 (SIX) HOURS AS NEEDED FOR WHEEZING OR SHORTNESS OF BREATH. 18 g 2   Alirocumab (PRALUENT) 150 MG/ML SOAJ Inject 1 pen  into the skin every 14 (fourteen) days. 2 mL 11   aspirin 81 MG EC tablet Take 1 tablet (81 mg total) by mouth daily. 90 tablet 0   b complex vitamins tablet Take 1 tablet by mouth 3 (three) times a week.     budesonide (PULMICORT) 0.25 MG/2ML nebulizer solution Take 0.25 mg by nebulization daily as needed (shortness of breath/wheezing).     buPROPion (WELLBUTRIN XL) 150 MG 24 hr tablet Take 1 tablet (150 mg total) by mouth every morning. 30 tablet 2   clopidogrel (PLAVIX) 75 MG tablet Take 1 tablet (75 mg total) by mouth daily with breakfast. 90 tablet 2   diclofenac Sodium (VOLTAREN) 1 % GEL Apply 2-4 g topically 3  (three) times daily as needed (knee pain).     diphenhydrAMINE (BENADRYL) 25 MG tablet Take 37.5 mg by mouth at bedtime.     DULoxetine (CYMBALTA) 20 MG capsule Take 1 capsule (20 mg total) by mouth daily. 30 capsule 2   erythromycin ophthalmic ointment Apply 1 cm twice daily to right eye for 1 week. (Patient not taking: Reported on 12/26/2021) 3.5 g 0   fluticasone (FLONASE) 50 MCG/ACT nasal spray Place 1 spray into both nostrils at bedtime as needed for allergies.     furosemide (LASIX) 20 MG tablet Take 1 tablet (20 mg total) by mouth daily as needed (for weight gain). 15 tablet 3   Glucagon 3 MG/DOSE POWD Place 3 mg into the nose once as needed for up to 1 dose. 1 each 11   hydrOXYzine (ATARAX) 25 MG tablet Take 1 tablet (25 mg total) by mouth 3 (three) times daily as needed for anxiety. 90 tablet 2   insulin lispro (HUMALOG) 100 UNIT/ML injection Inject 0.35 mLs (35 Units total) into the skin daily via pump. 10 mL 1   levothyroxine (SYNTHROID) 100 MCG tablet Take 100 mcg by mouth daily before breakfast.     losartan (COZAAR) 25 MG tablet Take 0.5 tablets (12.5 mg total) by mouth daily. 45 tablet 3   methylcellulose (CITRUCEL) oral powder Take 1 packet by mouth daily.     metoCLOPramide (REGLAN) 5 MG tablet Take 1 tablet (5 mg total) by mouth every 8 (eight) hours as needed for nausea or vomiting. 60 tablet 3   metoprolol succinate (TOPROL-XL) 25 MG 24 hr tablet Take 0.5 tablets (12.5 mg total) by mouth daily. 15 tablet 6   montelukast (SINGULAIR) 10 MG tablet Take 1 tablet (10 mg total) by  mouth at bedtime. 90 tablet 2   Multiple Vitamin (MULTIVITAMIN WITH MINERALS) TABS tablet Take 1 tablet by mouth daily.     nitroGLYCERIN (NITROSTAT) 0.4 MG SL tablet Place 1 tablet (0.4 mg total) under the tongue every 5 (five) minutes as needed for chest pain. 25 tablet 6   ofloxacin (OCUFLOX) 0.3 % ophthalmic solution Place 1 drop into the right eye 4 (four) times daily for 7 days. 5 mL 0   omeprazole  (PRILOSEC OTC) 20 MG tablet Take 20 mg by mouth daily as needed (acid reflux).     OVER THE COUNTER MEDICATION Take 3 capsules by mouth daily before breakfast. [employer] Super Lysine Immune Support     prednisoLONE acetate (PRED FORTE) 1 % ophthalmic suspension Place 1 drop into the right eye 4 (four) times daily for 7 days, THEN 1 drop 3 (three) times daily for 7 days, THEN 1 drop 2 (two) times daily for 7 days, THEN 1 drop daily for 7 days. 10 mL 0   spironolactone (ALDACTONE) 25 MG tablet Take 12.5 mg by mouth daily.     No current facility-administered medications on file prior to visit.   Allergies  Allergen Reactions   Atorvastatin Other (See Comments)    Myalgias    Crestor [Rosuvastatin Calcium] Other (See Comments)    Severe myalgias and joint pain. Has tolerated atorvastatin though   Monosodium Glutamate Nausea And Vomiting and Other (See Comments)    MSG - migraine   Zetia [Ezetimibe] Other (See Comments)    myalgias   Ciprofloxin Hcl [Ciprofloxacin] Nausea And Vomiting and Other (See Comments)    Severe migraine   Food Color Red [Red Dye] Diarrhea   Family History  Problem Relation Age of Onset   Cancer Mother        T cell lymphoma   Hypertension Mother    Hypercalcemia Mother    OCD Father    Anxiety disorder Father    Coronary artery disease Father    Congestive Heart Failure Father    Asthma Father    Hypercalcemia Father    Hypertension Father    Colon polyps Father    Depression Maternal Grandmother    Diabetes Paternal Grandmother    Stomach cancer Neg Hx    Esophageal cancer Neg Hx    Colon cancer Neg Hx    Rectal cancer Neg Hx     PE: BP 100/62 (BP Location: Right Arm, Patient Position: Sitting, Cuff Size: Normal)   Pulse 88   Ht _0  (1.651 m)   Wt 153 lb 6.4 oz (69.6 kg)   SpO2 99%   BMI 25.53 kg/m  Wt Readings from Last 3 Encounters:  01/04/22 153 lb 6.4 oz (69.6 kg)  12/27/21 151 lb (68.5 kg)  12/23/21 151 lb 9.6 oz (68.8 kg)    Constitutional: normal weight, anxious appearing Eyes:  no exophthalmos ENT:  no masses palpated in neck, no cervical lymphadenopathy Cardiovascular: RRR, No MRG Respiratory: CTA B Musculoskeletal: no deformities Skin: moist, warm, + vitiligo on arms and chest Neurological: + Mild tremor with outstretched hands  ASSESSMENT: 1. DM1, uncontrolled, with complications - DKA - CAD, s/p AMI, s/p PTCA - DES; s/p NSTEMI 0418/2023 - iCMP - gastroparesis - ? (Recent gastric emptying study normal on 12/02/2021)  2. Hypothyroidism  PLAN:  1. Patient with longstanding, uncontrolled, type 1 diabetes.  HbA1c at last visit was slightly better, at 8.0%, but still above target.  Before last visit she was admitted  for DKA as her sensor was malfunctioning and giving her readings that were much lower than they actually were >> she was drinking soda to bring them up so sugars increased to 600s. -At last visit, sugars were fluctuating, but they were higher after lunch and especially after dinner.  We identified the following problems in her diabetes management: She is not bolusing 15 minutes before meals, but many times, she boluses when the sugars are already up after a meal.  As a consequence, her sugars are dropping too low afterwards.  She does have gastroparesis and we discussed about trying to bolus 15 minutes before each meal, however, to move the boluses closer to the meal if needed depending on the blood sugars.  However, entering carbs and taking the bolus when the sugars already high after a meal is not conducive to good diabetes control Sugars are dropping overnight but this is most likely due to bolusing for dinner too late as mentioned above She is not entering all of the carbs into the pump.  We discussed about trying to enter blood sugars, enter carbs, and bolus before every meal. She changes her insulin in the reservoir and her infusion site less than every 7 days.  We discussed about  inflammation at the site and inconsistent absorption if she does that.  She was trying to ration her supplies from [employer]tr[employer], but she is waiting for new supplies now.   She is not in the auto mode.  She states in the manual mode and she did not notice that.  I advised her to start the automatic mode She is stopping the pump for several hours a day.  We discussed that this is dangerous.  She mentions that many times when she sees a low blood sugar, especially during the night, she stops the pump and that she forgets to turn it on.  We discussed that she should not stop the pump, but correct the low blood sugar with scars and leave the pump going. -at todays visit, she still does not have the CGM as she did not feel that this was accurate for her.  She is checking sugars sporadically, 0-2x a day and mostly manual boluses.  As expected, her sugars remain uncontrolled, mostly elevated.   -At this visit, she tells me that she would like to switch to the [facility] system and she also would want to change her pump.  I definitely agree with this.  We discussed about available pumps on the market and I recommended either the OmniPod or t:slim X2.  We discussed about the iLet pump but she would not want to try this since this is only recently approved.  I gave her brochures for the former 2 pumps and she will review them and decide how she wanted to proceed.  I  recommended the Dexcom G7 CGM and send a prescription for this to her pharmacy.  She can start this right away.  After he decides about the pump, she will let me know and I will refer her to the diabetes educator for prepump training. -At today's visit we discussed about bolusing before every meal and doing recommended, rather than manual boluses, but this would be easier for her to do when in auto mode.  Also, she needs to change the pump site every 3 to 4 days, as she is now changing it approximately every week.  She agrees with the above plan. - I suggested  to: Patient Instructions  Please use the  following pump settings: - basal rates: 12 am: 0.650 units/h 6 am: 0.675 10 am: 0.700 - ICR: 1:16 - target: 100-120 - ISF: 65 - Insulin on Board: 4h  Look up: -Omnipod5 -t:slim X2 -iLet  Start the Dexcom G7 CGM.  Please do the following approximately 15 minutes before every meal: - Enter carbs (C) - Enter sugars (S) - Start insulin bolus (I)  Please continue Levothyroxine 100 mcg daily.  Take the thyroid hormone every day, with water, at least 30 minutes before breakfast, separated by at least 4 hours from: - acid reflux medications - calcium - iron - multivitamins  Please return in 3-4 months.  - we checked her HbA1c: 8.0% (stable) - advised to check sugars at different times of the day - 4x a day, rotating check times - advised for yearly eye exams >> she is UTD - Return to clinic in 3-4 months  2.  Hypothyroidism - latest thyroid labs reviewed with pt. >> normal: Lab Results  Component Value Date   TSH 2.505 05/15/2021  - she continues on LT4 100 mcg daily - pt feels good on this dose. - we discussed about taking the thyroid hormone every day, with water, >30 minutes before breakfast, separated by >4 hours from acid reflux medications, calcium, iron, multivitamins. Pt. is taking it correctly. - will check thyroid tests at next visit  Philemon Kingdom, MD PhD [facility]

## 2022-01-04 NOTE — Psych (Signed)
Virtual Visit via Video Note  I connected with Jennifer Hogan on 09/19/21 at  9:05 AM EDT by a video enabled telemedicine application and verified that I am speaking with the correct person using two identifiers.  Location: Patient: patient home Provider: clinical home office   I discussed the limitations of evaluation and management by telemedicine and the availability of in person appointments. The patient expressed understanding and agreed to proceed.   I discussed the assessment and treatment plan with the patient. The patient was provided an opportunity to ask questions and all were answered. The patient agreed with the plan and demonstrated an understanding of the instructions.   The patient was advised to call back or seek an in-person evaluation if the symptoms worsen or if the condition fails to improve as anticipated.  Pt was provided 240 minutes of non-face-to-face time during this encounter.   Lorin Glass, LCSW   [facility] BH PHP THERAPIST PROGRESS NOTE  Jennifer Hogan [id number]  Session Time: 9:00 - 10:00  Participation Level: Active  Behavioral Response: CasualAlertDepressed  Type of Therapy: Group Therapy  Treatment Goals addressed: Coping  Progress Towards Goals: Progressing  Interventions: CBT, DBT, Supportive, and Reframing  Summary: Clinician led check-in regarding current stressors and situation, and review of patient completed daily inventory. Clinician utilized active listening and empathetic response and validated patient emotions. Clinician facilitated processing group on pertinent issues.?    Summary: Jennifer Hogan is a 51 y.o. female who presents with depression and anxiety symptoms. Patient arrived within time allowed. Patient rates her mood at a 5 on a scale of 1-10 with 10 being best. Pt states feeling "okay." Pt states her cat died yesterday and she is very sad. Pt states she is trying to let herself rest and grieve. Pt reports passive SI  and wanting "life to stop happening around me." Patient able to process. Patient engaged in discussion.         Session Time: 10:00 am - 11:00 am   Participation Level: Active   Behavioral Response: CasualAlertDepressed   Type of Therapy: Group Therapy   Treatment Goals addressed: Coping   Progress Towards Goals: Progressing   Interventions: CBT, DBT, Solution Focused, Strength-based, Supportive, and Reframing   Therapist Response:  Cln led discussion on accountability and the balance between taking responsibility and not beating ourselves up. Group members shared current consequences they are dealing with and how they are processing them. Cln encouraged pt's to utilize the Best Friend Test to make it easier to offer themselves kindness.    Therapist Response: Pt engaged in discussion and is able to process.         Session Time: 11:00 -12:00   Participation Level: Active   Behavioral Response: CasualAlertDepressed   Type of Therapy: Group Therapy   Treatment Goals addressed: Coping   Progress Towards Goals: Progressing   Interventions: CBT, DBT, Solution Focused, Strength-based, Supportive, and Reframing   Summary: Cln continued topic of boundaries. Cln discussed the different ways boundaries present: physical, emotional, intellectual, sexual, material, and time. Group talked about the ways in which each type presents for them and is a struggle.    Therapist Response: Pt engaged in discussion and is able to identify ways in which each is problematic for them.       Session Time: 12:00 -1:00   Participation Level: Active   Behavioral Response: CasualAlertDepressed   Type of Therapy: Group therapy, Occupational Therapy   Treatment Goals addressed: Coping   Progress  Towards Goals: Progressing   Interventions: Supportive; Psychoeducation   Summary: 12:00 - 12:50: Occupational Therapy group led by cln E. Hollan. 12:50 - 1:00 Clinician assessed for immediate  needs, medication compliance and efficacy, and safety concerns.   Therapist Response: 12:00 - 12:50: Pt engaged 12:50 - 1:00 pm: At check-out, patient reports no immediate concerns. Patient demonstrates progress as evidenced by increased ability to balance feelings. Patient denies SI/HI/self-harm thoughts at the end of group.    Suicidal/Homicidal: Nowithout intent/plan  Plan: Pt will continue in PHP while working to decrease depression and anxiety symptoms, increase ADLs, and increase ability to manage symptoms in a healthy manner.   Collaboration of Care: Medication Management AEB V Doda  Patient/Guardian was advised Release of Information must be obtained prior to any record release in order to collaborate their care with an outside provider. Patient/Guardian was advised if they have not already done so to contact the registration department to sign all necessary forms in order for [name] to release information regarding their care.   Consent: Patient/Guardian gives verbal consent for treatment and assignment of benefits for services provided during this visit. Patient/Guardian expressed understanding and agreed to proceed.   Diagnosis: Severe episode of recurrent major depressive disorder, without psychotic features ([place]) [F33.2]    1. Severe episode of recurrent major depressive disorder, without psychotic features ([place])   2. GAD (generalized anxiety disorder)      Lorin Glass, LCSW

## 2022-01-05 ENCOUNTER — Other Ambulatory Visit: Payer: Self-pay

## 2022-01-05 NOTE — Psych (Signed)
Virtual Visit via Video Note  I connected with Jennifer Hogan on 09/20/21 at  9:05 AM EDT by a video enabled telemedicine application and verified that I am speaking with the correct person using two identifiers.  Location: Patient: patient home Provider: clinical home office   I discussed the limitations of evaluation and management by telemedicine and the availability of in person appointments. The patient expressed understanding and agreed to proceed.   I discussed the assessment and treatment plan with the patient. The patient was provided an opportunity to ask questions and all were answered. The patient agreed with the plan and demonstrated an understanding of the instructions.   The patient was advised to call back or seek an in-person evaluation if the symptoms worsen or if the condition fails to improve as anticipated.  Pt was provided 240 minutes of non-face-to-face time during this encounter.   Jennifer Glass, Jennifer Hogan   [facility] BH PHP THERAPIST PROGRESS NOTE  Jennifer Hogan [id number]  Session Time: 9:00 - 10:00  Participation Level: Active  Behavioral Response: CasualAlertDepressed  Type of Therapy: Group Therapy  Treatment Goals addressed: Coping  Progress Towards Goals: Progressing  Interventions: CBT, DBT, Supportive, and Reframing  Summary: Clinician led check-in regarding current stressors and situation, and review of patient completed daily inventory. Clinician utilized active listening and empathetic response and validated patient emotions. Clinician facilitated processing group on pertinent issues.?    Summary: Jennifer Hogan is a 51 y.o. female who presents with depression and anxiety symptoms. Patient arrived within time allowed. Patient rates her mood at a 7 on a scale of 1-10 with 10 being best. Pt states her cat's death exacerbated grief from her dad's death she has been carrying. Pt reports recognizing this helped her reconcile some of that grief  and put her in a better place. Pt reports engaging in positive coping to manage her feelings yesterday. Pt states she ate substantial food, slept well, and had fleeting passive SI that she felt in control of. Patient able to process. Patient engaged in discussion.         Session Time: 10:00 am - 11:00 am   Participation Level: Active   Behavioral Response: CasualAlertDepressed   Type of Therapy: Group Therapy   Treatment Goals addressed: Coping   Progress Towards Goals: Progressing   Interventions: CBT, DBT, Solution Focused, Strength-based, Supportive, and Reframing   Therapist Response: Cln led discussion on rumination: and how it can affect [name] negatively. Group members share topics, situations, and times in which rumination is most problem problematic for them. Cln encouraged pt's to consider DBT distraction and STOP skills or CBT thought challenging to manage rumination.    Therapist Response: Pt engaged in discussion and is able to process.         Session Time: 11:00 -12:00   Participation Level: Active   Behavioral Response: CasualAlertDepressed   Type of Therapy: Group Therapy   Treatment Goals addressed: Coping   Progress Towards Goals: Progressing   Interventions: CBT, DBT, Solution Focused, Strength-based, Supportive, and Reframing   Summary: Cln continued topic of boundaries and introduced how to set and maintain healthy boundaries. Cln utilized handout "how to set boundaries" and group members worked through examples to practice setting appropriate boundaries.    Therapist Response: Pt engaged in discussion and is able to identify ways to apply.       Session Time: 12:00 -1:00   Participation Level: Active   Behavioral Response: CasualAlertDepressed   Type of Therapy:  Group therapy, Occupational Therapy   Treatment Goals addressed: Coping   Progress Towards Goals: Progressing   Interventions: Supportive; Psychoeducation   Summary: 12:00 -  12:50: Occupational Therapy group led by cln Jennifer Hogan. 12:50 - 1:00 Clinician assessed for immediate needs, medication compliance and efficacy, and safety concerns.   Therapist Response: 12:00 - 12:50: Pt engaged 12:50 - 1:00 pm: At check-out, patient reports no immediate concerns. Patient demonstrates progress as evidenced by increased ability to balance feelings. Patient denies SI/HI/self-harm thoughts at the end of group.    Suicidal/Homicidal: Nowithout intent/plan  Plan: Pt will discharge from [facility] due to meeting treatment goals of decreased depression and anxiety symptoms, increased ADLs, and increased ability to manage symptoms in a healthy manner. Pt will return to regular OP therapist and is scheduled with Dr Jennifer Hogan at [facility] for psychiatry on 8/30. Pt and provider are aligned with discharge plan. Pt denies SI/HI at time of discharge.   Collaboration of Care: Medication Management AEB V Doda  Patient/Guardian was advised Release of Information must be obtained prior to any record release in order to collaborate their care with an outside provider. Patient/Guardian was advised if they have not already done so to contact the registration department to sign all necessary forms in order for [name] to release information regarding their care.   Consent: Patient/Guardian gives verbal consent for treatment and assignment of benefits for services provided during this visit. Patient/Guardian expressed understanding and agreed to proceed.   Diagnosis: Severe episode of recurrent major depressive disorder, without psychotic features ([place]) [F33.2]    1. Severe episode of recurrent major depressive disorder, without psychotic features ([place])   2. GAD (generalized anxiety disorder)      Jennifer Glass, Jennifer Hogan

## 2022-01-09 ENCOUNTER — Ambulatory Visit: Payer: Self-pay | Admitting: Nurse Practitioner

## 2022-01-09 ENCOUNTER — Other Ambulatory Visit: Payer: Self-pay

## 2022-01-09 ENCOUNTER — Ambulatory Visit: Payer: Medicaid Other | Attending: Nurse Practitioner | Admitting: Internal Medicine

## 2022-01-09 VITALS — BP 112/72 | HR 67 | Temp 97.8°F | Ht 65.0 in | Wt 149.0 lb

## 2022-01-09 DIAGNOSIS — F411 Generalized anxiety disorder: Secondary | ICD-10-CM

## 2022-01-09 DIAGNOSIS — I2510 Atherosclerotic heart disease of native coronary artery without angina pectoris: Secondary | ICD-10-CM

## 2022-01-09 DIAGNOSIS — R0981 Nasal congestion: Secondary | ICD-10-CM

## 2022-01-09 DIAGNOSIS — E1059 Type 1 diabetes mellitus with other circulatory complications: Secondary | ICD-10-CM | POA: Diagnosis not present

## 2022-01-09 MED ORDER — FLUTICASONE PROPIONATE 50 MCG/ACT NA SUSP
NASAL | 0 refills | Status: DC
  Filled 2022-01-09: qty 16, 25d supply, fill #0
  Filled 2022-01-23: qty 16, 60d supply, fill #0
  Filled 2022-01-24: qty 16, 30d supply, fill #0

## 2022-01-09 NOTE — Progress Notes (Signed)
[name], female    DOB: 09-Jun-1970  MRN: [id number]  CC: Anxiety (Anxiety f/u. /Experiencing excessive fatigue X1 mo. Interfering with everyday life./Already received flu vax this season. )   Subjective: Jennifer Hogan is a 51 y.o. female who presents for f/u visit.  PCP is [name] whom she last saw 09/28/2021 Her concerns today include:  Patient with history of CAD status post CABG in 10/2018,  DES 2019 and 2020, ICM, DM 1 with gastroparesis, hypothyroidism, GAD/MDD  Anxiety:  plugged in with [facility] for medication management Has appt with a therapist at [facility] on Wednesday of this wk.'On Wellbutrin and Cymbalta which helps in controlling the anxiety Currently has limited finances, Not able to work full time due to her health issues.  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  She gets some assistance and recently has been approved for Medicaid.    DM Lab Results  Component Value Date   HGBA1C 8.0 (A) 01/04/2022  Followed by Dr. Cruzita Lederer. On insulin pump Humalog Trying to get Dexcom 7 which he plans to pick up today from the pharmacy.  The [employer] that she currently has gives inaccurate reading  Was doing better when she had food stamps benefits.  Now purchases what she can afford most of which is not good for her DM.  Poor appetite but will binge eat things she likes like popcorn.  So she tries not to purchase these things Not getting in any exercise. 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  She sits on the couch  Feels tire and lack energy.  Would like to do some cardiac rehab now that she has Medicaid.    CAD:  no CP/SOB.  Last took SL Nitro 2 mths ago Compliant with ASA 81 mg, Plavix, Losartan, Praluent, Toprol XL  Reports some sinus congestion and drainage over the past 2 to 3 weeks.  No fever.  A little achy over the sinuses.  No discolored mucus from the  nose.   Patient Active Problem List   Diagnosis Date Noted   Benign neoplasm of colon    MDD (major depressive disorder), recurrent episode, severe ([place]) 08/24/2021   GAD (generalized anxiety disorder) 08/24/2021   Community acquired pneumonia    Heart failure with reduced ejection fraction ([place]) 05/11/2021   Acute respiratory failure with hypoxia ([place]) 05/11/2021   DCM (dilated cardiomyopathy) ([place])    Takotsubo cardiomyopathy    AKI (acute kidney injury) ([place]) 05/01/2019   Altered bowel habits 11/28/2018   Sinus tachycardia 81/01/7508   Chronic systolic heart failure ([place])    Cardiomyopathy ([place])    DKA, type 1 ([place]) 05/24/2018   Moderate episode of recurrent major depressive disorder ([place]) 03/27/2018   Abdominal pain, epigastric    Hyperglycemia due to type 1 diabetes mellitus ([place]) 02/14/2018   Hyperlipidemia LDL goal <70 02/14/2018   S/P drug eluting coronary stent placement    Non-ST elevation (NSTEMI) myocardial infarction ([place]) 02/08/2018   Marijuana abuse 12/31/2017   Gastroparesis 12/06/2017   Gastroparesis due to DM ([place]) 03/28/2017   CAD S/P percutaneous coronary angioplasty 03/28/2017   Ischemic cardiomyopathy 03/28/2017   Pure hypercholesterolemia    NSTEMI (non-ST elevated myocardial infarction) ([place])    Anemia 03/17/2017   SIRS (systemic inflammatory response syndrome) ([place]) 03/15/2017   Allergic state 08/07/2015   Vitiligo 07/09/2012   Type 1 diabetes mellitus ([place])  07/09/2012   Depression 12/04/2010   Nausea and vomiting 12/04/2010   Hypothyroidism 12/04/2010     Current Outpatient Medications on File Prior to Visit  Medication Sig Dispense Refill   acetaminophen (TYLENOL) 325 MG tablet Take 650 mg by mouth daily as needed for mild pain, fever or headache.     albuterol (VENTOLIN HFA) 108 (90 Base) MCG/ACT inhaler INHALE 2 PUFFS INTO THE LUNGS EVERY 6 (SIX) HOURS AS NEEDED FOR WHEEZING OR SHORTNESS OF BREATH. 18 g 2   Alirocumab (PRALUENT) 150 MG/ML  SOAJ Inject 1 pen  into the skin every 14 (fourteen) days. 2 mL 11   aspirin 81 MG EC tablet Take 1 tablet (81 mg total) by mouth daily. 90 tablet 0   b complex vitamins tablet Take 1 tablet by mouth 3 (three) times a week.     budesonide (PULMICORT) 0.25 MG/2ML nebulizer solution Take 0.25 mg by nebulization daily as needed (shortness of breath/wheezing).     buPROPion (WELLBUTRIN XL) 150 MG 24 hr tablet Take 1 tablet (150 mg total) by mouth every morning. 30 tablet 2   clopidogrel (PLAVIX) 75 MG tablet Take 1 tablet (75 mg total) by mouth daily with breakfast. 90 tablet 2   Continuous Blood Gluc Sensor (DEXCOM G7 SENSOR) MISC Apply 1 sensor every 10 days 9 each 4   diclofenac Sodium (VOLTAREN) 1 % GEL Apply 2-4 g topically 3 (three) times daily as needed (knee pain).     diphenhydrAMINE (BENADRYL) 25 MG tablet Take 37.5 mg by mouth at bedtime.     DULoxetine (CYMBALTA) 20 MG capsule Take 1 capsule (20 mg total) by mouth daily. 30 capsule 2   erythromycin ophthalmic ointment Apply 1 cm twice daily to right eye for 1 week. 3.5 g 0   fluticasone (FLONASE) 50 MCG/ACT nasal spray Place 1 spray into both nostrils at bedtime as needed for allergies.     furosemide (LASIX) 20 MG tablet Take 1 tablet (20 mg total) by mouth daily as needed (for weight gain). 15 tablet 3   Glucagon 3 MG/DOSE POWD Place 3 mg into the nose once as needed for up to 1 dose. 1 each 11   hydrOXYzine (ATARAX) 25 MG tablet Take 1 tablet (25 mg total) by mouth 3 (three) times daily as needed for anxiety. 90 tablet 2   insulin lispro (HUMALOG) 100 UNIT/ML injection Inject 0.35 mLs (35 Units total) into the skin daily via pump. 10 mL 1   levothyroxine (SYNTHROID) 100 MCG tablet Take 100 mcg by mouth daily before breakfast.     losartan (COZAAR) 25 MG tablet Take 0.5 tablets (12.5 mg total) by mouth daily. 45 tablet 3   methylcellulose (CITRUCEL) oral powder Take 1 packet by mouth daily.     metoCLOPramide (REGLAN) 5 MG tablet Take 1  tablet (5 mg total) by mouth every 8 (eight) hours as needed for nausea or vomiting. 60 tablet 3   metoprolol succinate (TOPROL-XL) 25 MG 24 hr tablet Take 0.5 tablets (12.5 mg total) by mouth daily. 15 tablet 6   montelukast (SINGULAIR) 10 MG tablet Take 1 tablet (10 mg total) by mouth at bedtime. 90 tablet 2   Multiple Vitamin (MULTIVITAMIN WITH MINERALS) TABS tablet Take 1 tablet by mouth daily.     nitroGLYCERIN (NITROSTAT) 0.4 MG SL tablet Place 1 tablet (0.4 mg total) under the tongue every 5 (five) minutes as needed for chest pain. 25 tablet 6   ofloxacin (OCUFLOX) 0.3 % ophthalmic solution Place 1  drop into the right eye 4 (four) times daily for 7 days. 5 mL 0   omeprazole (PRILOSEC OTC) 20 MG tablet Take 20 mg by mouth daily as needed (acid reflux).     OVER THE COUNTER MEDICATION Take 3 capsules by mouth daily before breakfast. [employer] Super Lysine Immune Support     prednisoLONE acetate (PRED FORTE) 1 % ophthalmic suspension Place 1 drop into the right eye 4 (four) times daily for 7 days, THEN 1 drop 3 (three) times daily for 7 days, THEN 1 drop 2 (two) times daily for 7 days, THEN 1 drop daily for 7 days. 10 mL 0   spironolactone (ALDACTONE) 25 MG tablet Take 12.5 mg by mouth daily.     No current facility-administered medications on file prior to visit.    Allergies  Allergen Reactions   Atorvastatin Other (See Comments)    Myalgias    Crestor [Rosuvastatin Calcium] Other (See Comments)    Severe myalgias and joint pain. 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 3 times weekly; reports decreasing use   Sexual activity: Not Currently    Birth control/protection: None  Other Topics Concern   Not on file  Social History Narrative   Not on file   Social Determinants of Health   Financial Resource Strain: High Risk (10/05/2020)   Overall Financial Resource Strain (CARDIA)    Difficulty of Paying Living Expenses: Very hard  Food Insecurity: Food Insecurity Present (10/05/2020)   Hunger Vital Sign    Worried About Running Out of Food in the Last Year: Sometimes true    Ran Out of Food in the Last Year: Sometimes true  Transportation Needs: No Transportation Needs (10/05/2020)   PRAPARE - Hydrologist (Medical): No    Lack of Transportation (Non-Medical): No  Physical Activity: Not on file  Stress: Not on file  Social Connections: Unknown (12/07/2017)   Social Connection and Isolation Panel [NHANES]    Frequency of Communication with Friends and Family: Patient refused    Frequency of Social Gatherings with Friends and Family: Patient refused    Attends Religious Services: Patient refused    Active Member of Clubs or Organizations: Patient refused    Attends Archivist Meetings: Patient refused  Marital Status: Patient refused  Intimate Partner Violence: Not At Risk (12/07/2017)   Humiliation, Afraid, Rape, and Kick  questionnaire    Fear of Current or Ex-Partner: No    Emotionally Abused: No    Physically Abused: No    Sexually Abused: No    Family History  Problem Relation Age of Onset   Cancer Mother        T cell lymphoma   Hypertension Mother    Hypercalcemia Mother    OCD Father    Anxiety disorder Father    Coronary artery disease Father    Congestive Heart Failure Father    Asthma Father    Hypercalcemia Father    Hypertension Father    Colon polyps Father    Depression Maternal Grandmother    Diabetes Paternal Grandmother    Stomach cancer Neg Hx    Esophageal cancer Neg Hx    Colon cancer Neg Hx    Rectal cancer Neg Hx     Past Surgical History:  Procedure Laterality Date   25 GAUGE PARS PLANA VITRECTOMY WITH 20 GAUGE MVR PORT Right 11/24/2021   Procedure: 25 GAUGE PARS PLANA VITRECTOMY APPROACH RIGHT EYE, FLUID FLUID EXCHANGE;  Surgeon: Jalene Mullet, MD;  Location: [place];  Service: Ophthalmology;  Laterality: Right;   AIR/FLUID EXCHANGE Right 12/27/2021   Procedure: AIR/FLUID EXCHANGE;  Surgeon: Jalene Mullet, MD;  Location: [place];  Service: Ophthalmology;  Laterality: Right;   APPENDECTOMY     BIOPSY  09/29/2021   Procedure: BIOPSY;  Surgeon: Yetta Flock, MD;  Location: [facility];  Service: Gastroenterology;;   CATARACT EXTRACTION W/PHACO Right 11/24/2021   Procedure: CATARACT EXTRACTION AND INTRAOCULAR LENS PLACEMENT ([place]) RIGHT EYE;  Surgeon: Jalene Mullet, MD;  Location: [place];  Service: Ophthalmology;  Laterality: Right;   COLONOSCOPY     COLONOSCOPY WITH PROPOFOL N/A 09/29/2021   Procedure: COLONOSCOPY WITH PROPOFOL;  Surgeon: Yetta Flock, MD;  Location: [facility];  Service: Gastroenterology;  Laterality: N/A;   CORONARY ARTERY BYPASS GRAFT N/A 11/04/2018   Procedure: CORONARY ARTERY BYPASS GRAFTING (CABG), ON PUMP, TIMES THREE, USING LEFT AND RIGHT INTERNAL MAMMARY ARTERIES;  Surgeon: Wonda Olds, MD;  Location: [place];  Service: Open  Heart Surgery;  Laterality: N/A;   CORONARY STENT INTERVENTION N/A 03/20/2017   Procedure: DES x 3 LAD, DES OM; Surgeon: Burnell Blanks, MD;  Location: [place] [facility];  Service: Cardiovascular;  Laterality: N/A;   CORONARY/GRAFT ACUTE MI REVASCULARIZATION N/A 02/08/2018   Procedure: Coronary/Graft Acute MI Revascularization;  Surgeon: Belva Crome, MD;  Location: [place] [facility];  Service: Cardiovascular;  Laterality: N/A;   ESOPHAGOGASTRODUODENOSCOPY (EGD) WITH PROPOFOL N/A 09/29/2021   Procedure: ESOPHAGOGASTRODUODENOSCOPY (EGD) WITH PROPOFOL;  Surgeon: Yetta Flock, MD;  Location: [facility];  Service: Gastroenterology;  Laterality: N/A;   GAS INSERTION Right 04/28/2021   Procedure: INSERTION OF GAS ( C3F8);  Surgeon: Jalene Mullet, MD;  Location: [place];  Service: Ophthalmology;  Laterality: Right;  Right eye   GAS/FLUID EXCHANGE Right 04/28/2021   Procedure: GAS/FLUID EXCHANGE;  Surgeon: Jalene Mullet, MD;  Location: [place];  Service: Ophthalmology;  Laterality: Right;  Right eye   LASER PHOTO ABLATION Right 11/24/2021   Procedure: ENDO LASER PAN PHOTOCOAGULATION;  Surgeon: Jalene Mullet, MD;  Location: [place];  Service: Ophthalmology;  Laterality: Right;   LASER PHOTO ABLATION Right 12/27/2021   Procedure: ENDO LASER;  Surgeon: Jalene Mullet, MD;  Location: [place];  Service: Ophthalmology;  Laterality: Right;   LEFT HEART CATH AND CORONARY ANGIOGRAPHY N/A 03/20/2017   Procedure: LEFT HEART CATH AND CORONARY ANGIOGRAPHY;  Surgeon: Burnell Blanks, MD;  Location: [place] [facility];  Service: Cardiovascular;  Laterality: N/A;   LEFT HEART CATH AND CORONARY ANGIOGRAPHY N/A 02/08/2018   Procedure: LEFT HEART CATH AND CORONARY ANGIOGRAPHY;  Surgeon: Belva Crome, MD;  Location: [place] [facility];  Service: Cardiovascular;  Laterality: N/A;   LEFT HEART CATH AND CORONARY ANGIOGRAPHY N/A 10/29/2018   Procedure: LEFT HEART CATH AND CORONARY ANGIOGRAPHY;   Surgeon: Burnell Blanks, MD;  Location: [place] [facility];  Service: Cardiovascular;  Laterality: N/A;   LEFT HEART CATH AND CORS/GRAFTS ANGIOGRAPHY N/A 10/04/2020   Procedure: LEFT HEART CATH AND CORS/GRAFTS ANGIOGRAPHY;  Surgeon: Sherren Mocha, MD;  Location: [place] [facility];  Service: Cardiovascular;  Laterality: N/A;   LEFT HEART CATH AND CORS/GRAFTS ANGIOGRAPHY N/A 05/11/2021   Procedure: LEFT HEART CATH AND CORS/GRAFTS ANGIOGRAPHY;  Surgeon: Martinique, Peter M, MD;  Location: [place] [facility];  Service: Cardiovascular;  Laterality: N/A;   PARS PLANA VITRECTOMY Right 04/28/2021   Procedure: PARS PLANA VITRECTOMY WITH 25 GAUGE REMOVAL OF TRATIONAL MEMBRANE RIGHT EYE; DRAINAGE OF SUBRETINAL FLUID RIGHT EYE;  Surgeon: Jalene Mullet, MD;  Location: [place];  Service: Ophthalmology;  Laterality: Right;  Right eye   PARS PLANA VITRECTOMY 27 GAUGE Right 12/27/2021   Procedure: PARS PLANA VITRECTOMY 27 GAUGE;  Surgeon: Jalene Mullet, MD;  Location: [place];  Service: Ophthalmology;  Laterality: Right;   PHOTOCOAGULATION WITH LASER Right 04/28/2021   Procedure: PHOTOCOAGULATION WITH LASER;  Surgeon: Jalene Mullet, MD;  Location: [place];  Service: Ophthalmology;  Laterality: Right;  Right eye   POLYPECTOMY  09/29/2021   Procedure: POLYPECTOMY;  Surgeon: Yetta Flock, MD;  Location[facility];  Service: Gastroenterology;;   TEE WITHOUT CARDIOVERSION N/A 11/04/2018   Procedure: TRANSESOPHAGEAL ECHOCARDIOGRAM (TEE);  Surgeon: Wonda Olds, MD;  Location: [place];  Service: Open Heart Surgery;  Laterality: N/A;    ROS: Review of Systems Negative except as stated above  PHYSICAL EXAM: BP 112/72 (BP Location: Left Arm, Patient Position: Sitting, Cuff Size: Normal)   Pulse 67   Temp 97.8 F (36.6 C) (Oral)   Ht '5\' 5"'$  (1.651 m)   Wt 149 lb (67.6 kg)   SpO2 100%   BMI 24.79 kg/m   Physical Exam  General appearance - alert, well appearing, middle-age Caucasian female and  in no distress Mental status - normal mood, behavior, speech, dress, motor activity, and thought processes Nose - normal and patent, no erythema, discharge or polyps Mouth - mucous membranes moist, pharynx normal without lesions Neck - supple, no significant adenopathy Chest - clear to auscultation, no wheezes, rales or rhonchi, symmetric air entry Heart - normal rate, regular rhythm, normal S1, S2, no murmurs, rubs, clicks or gallops Extremities - peripheral pulses normal, no pedal edema, no clubbing or cyanosis      Latest Ref Rng & Units 12/27/2021   12:56 PM 11/24/2021   11:00 AM 11/24/2021    9:37 AM  CMP  Glucose 70 - 99 mg/dL 87  86  130   BUN 6 - 20 mg/dL '18  15  14   '$ Creatinine 0.44 - 1.00 mg/dL 0.90  0.90  0.96   Sodium 135 - 145 mmol/L 134  137  139   Potassium 3.5 - 5.1 mmol/L 3.9  3.8  4.4   Chloride 98 -  111 mmol/L 106  108  110   CO2 22 - 32 mmol/L '21  21  24   '$ Calcium 8.9 - 10.3 mg/dL 9.2  9.3  9.3    Lipid Panel     Component Value Date/Time   CHOL 298 (H) 08/15/2021 1218   TRIG 111 08/15/2021 1218   HDL 76 08/15/2021 1218   CHOLHDL 3.9 08/15/2021 1218   CHOLHDL 3.3 05/11/2021 0747   VLDL 13 05/11/2021 0747   LDLCALC 203 (H) 08/15/2021 1218   LDLDIRECT 109 (H) 12/22/2019 1408    CBC    Component Value Date/Time   WBC 5.4 12/27/2021 1256   RBC 3.86 (L) 12/27/2021 1256   HGB 11.9 (L) 12/27/2021 1256   HGB 14.2 07/30/2020 1153   HCT 35.2 (L) 12/27/2021 1256   HCT 42.6 07/30/2020 1153   PLT 384 12/27/2021 1256   PLT 409 07/30/2020 1153   MCV 91.2 12/27/2021 1256   MCV 94 07/30/2020 1153   MCH 30.8 12/27/2021 1256   MCHC 33.8 12/27/2021 1256   RDW 13.2 12/27/2021 1256   RDW 13.3 07/30/2020 1153   LYMPHSABS 0.9 05/30/2021 1408   LYMPHSABS 1.5 03/28/2017 1100   MONOABS 0.3 05/30/2021 1408   EOSABS 0.0 05/30/2021 1408   EOSABS 0.3 03/28/2017 1100   BASOSABS 0.1 05/30/2021 1408   BASOSABS 0.0 03/28/2017 1100    ASSESSMENT AND PLAN:  1. Coronary  artery disease involving native coronary artery of native heart without angina pectoris Stable on her current medications and followed by cardiology.  She will continue metoprolol 12.5 mg daily, Cozaar 12.5 mg daily, Praluent, Plavix  2. Type 1 diabetes mellitus with other circulatory complication (HCC) Recent A1c not at goal.  Followed by endocrinology.  She plans to pick up the Dexcom 7 today which I think will be helpful in helping her determine how certain foods affect her blood sugars.  Continue Humalog insulin pump as prescribed by the endocrinologist.  3. GAD (generalized anxiety disorder) Plugged in with behavioral health.  She plans to get established with a therapist.  Appointment already scheduled for later this week.  4. Sinus congestion Recommend Zyrtec over-the-counter.  Recommend Flonase as needed. - fluticasone (FLONASE) 50 MCG/ACT nasal spray; insert 1 spray each nostril daily for 3 days then use 2-3 times a wk as needed  Dispense: 16 g; Refill: 0    Patient was given the opportunity to ask questions.  Patient verbalized understanding of the plan and was able to repeat key elements of the plan.   This documentation was completed using Radio producer.  Any transcriptional errors are unintentional.  No orders of the defined types were placed in this encounter.    Requested Prescriptions    No prescriptions requested or ordered in this encounter    No follow-ups on file.  Karle Plumber, MD, FACP

## 2022-01-09 NOTE — Patient Instructions (Signed)
I would recommend stopping Benadryl and using Zyrtec 10 mg daily at bedtime instead for the sinus congestion.  I have sent a prescription to the pharmacy for Flonase nasal spray.  Keep the upcoming appointment that you have scheduled with the therapist.  I would contact your cardiologist to inquire about doing cardiac rehab.

## 2022-01-10 ENCOUNTER — Other Ambulatory Visit: Payer: Self-pay

## 2022-01-13 ENCOUNTER — Encounter (HOSPITAL_COMMUNITY): Payer: Self-pay

## 2022-01-13 ENCOUNTER — Other Ambulatory Visit: Payer: Self-pay

## 2022-01-13 ENCOUNTER — Observation Stay (HOSPITAL_COMMUNITY): Payer: Medicare Other

## 2022-01-13 ENCOUNTER — Encounter (HOSPITAL_BASED_OUTPATIENT_CLINIC_OR_DEPARTMENT_OTHER): Payer: Self-pay | Admitting: Emergency Medicine

## 2022-01-13 ENCOUNTER — Inpatient Hospital Stay (HOSPITAL_BASED_OUTPATIENT_CLINIC_OR_DEPARTMENT_OTHER)
Admission: EM | Admit: 2022-01-13 | Discharge: 2022-01-15 | DRG: 638 | Disposition: A | Discharge status: Home / Self Care | Payer: Medicare Other | Attending: Internal Medicine | Admitting: Internal Medicine

## 2022-01-13 DIAGNOSIS — I214 Non-ST elevation (NSTEMI) myocardial infarction: Principal | ICD-10-CM

## 2022-01-13 DIAGNOSIS — Z818 Family history of other mental and behavioral disorders: Secondary | ICD-10-CM

## 2022-01-13 DIAGNOSIS — I2510 Atherosclerotic heart disease of native coronary artery without angina pectoris: Secondary | ICD-10-CM

## 2022-01-13 DIAGNOSIS — E1043 Type 1 diabetes mellitus with diabetic autonomic (poly)neuropathy: Secondary | ICD-10-CM | POA: Diagnosis not present

## 2022-01-13 DIAGNOSIS — Z888 Allergy status to other drugs, medicaments and biological substances status: Secondary | ICD-10-CM

## 2022-01-13 DIAGNOSIS — E1010 Type 1 diabetes mellitus with ketoacidosis without coma: Secondary | ICD-10-CM | POA: Diagnosis not present

## 2022-01-13 DIAGNOSIS — I2489 Other forms of acute ischemic heart disease: Secondary | ICD-10-CM | POA: Diagnosis present

## 2022-01-13 DIAGNOSIS — E039 Hypothyroidism, unspecified: Secondary | ICD-10-CM | POA: Diagnosis present

## 2022-01-13 DIAGNOSIS — I255 Ischemic cardiomyopathy: Secondary | ICD-10-CM | POA: Diagnosis not present

## 2022-01-13 DIAGNOSIS — K3184 Gastroparesis: Secondary | ICD-10-CM | POA: Diagnosis present

## 2022-01-13 DIAGNOSIS — Z79899 Other long term (current) drug therapy: Secondary | ICD-10-CM

## 2022-01-13 DIAGNOSIS — F1210 Cannabis abuse, uncomplicated: Secondary | ICD-10-CM | POA: Diagnosis present

## 2022-01-13 DIAGNOSIS — Z83719 Family history of colon polyps, unspecified: Secondary | ICD-10-CM

## 2022-01-13 DIAGNOSIS — Z807 Family history of other malignant neoplasms of lymphoid, hematopoietic and related tissues: Secondary | ICD-10-CM

## 2022-01-13 DIAGNOSIS — R7989 Other specified abnormal findings of blood chemistry: Secondary | ICD-10-CM

## 2022-01-13 DIAGNOSIS — Z7982 Long term (current) use of aspirin: Secondary | ICD-10-CM

## 2022-01-13 DIAGNOSIS — K219 Gastro-esophageal reflux disease without esophagitis: Secondary | ICD-10-CM | POA: Diagnosis not present

## 2022-01-13 DIAGNOSIS — F411 Generalized anxiety disorder: Secondary | ICD-10-CM | POA: Diagnosis not present

## 2022-01-13 DIAGNOSIS — I5023 Acute on chronic systolic (congestive) heart failure: Secondary | ICD-10-CM | POA: Diagnosis present

## 2022-01-13 DIAGNOSIS — I5020 Unspecified systolic (congestive) heart failure: Secondary | ICD-10-CM | POA: Diagnosis present

## 2022-01-13 DIAGNOSIS — Z881 Allergy status to other antibiotic agents status: Secondary | ICD-10-CM

## 2022-01-13 DIAGNOSIS — E1310 Other specified diabetes mellitus with ketoacidosis without coma: Secondary | ICD-10-CM

## 2022-01-13 DIAGNOSIS — Z951 Presence of aortocoronary bypass graft: Secondary | ICD-10-CM | POA: Diagnosis not present

## 2022-01-13 DIAGNOSIS — R739 Hyperglycemia, unspecified: Secondary | ICD-10-CM

## 2022-01-13 DIAGNOSIS — Z87891 Personal history of nicotine dependence: Secondary | ICD-10-CM | POA: Diagnosis not present

## 2022-01-13 DIAGNOSIS — I252 Old myocardial infarction: Secondary | ICD-10-CM

## 2022-01-13 DIAGNOSIS — E1110 Type 2 diabetes mellitus with ketoacidosis without coma: Secondary | ICD-10-CM

## 2022-01-13 DIAGNOSIS — F332 Major depressive disorder, recurrent severe without psychotic features: Secondary | ICD-10-CM | POA: Diagnosis present

## 2022-01-13 DIAGNOSIS — I5022 Chronic systolic (congestive) heart failure: Secondary | ICD-10-CM | POA: Diagnosis present

## 2022-01-13 DIAGNOSIS — Z833 Family history of diabetes mellitus: Secondary | ICD-10-CM

## 2022-01-13 DIAGNOSIS — Z9641 Presence of insulin pump (external) (internal): Secondary | ICD-10-CM | POA: Diagnosis present

## 2022-01-13 DIAGNOSIS — Z955 Presence of coronary angioplasty implant and graft: Secondary | ICD-10-CM

## 2022-01-13 DIAGNOSIS — E785 Hyperlipidemia, unspecified: Secondary | ICD-10-CM | POA: Diagnosis present

## 2022-01-13 DIAGNOSIS — Z794 Long term (current) use of insulin: Secondary | ICD-10-CM | POA: Diagnosis not present

## 2022-01-13 DIAGNOSIS — Z7902 Long term (current) use of antithrombotics/antiplatelets: Secondary | ICD-10-CM

## 2022-01-13 DIAGNOSIS — H4311 Vitreous hemorrhage, right eye: Secondary | ICD-10-CM | POA: Diagnosis not present

## 2022-01-13 DIAGNOSIS — E7800 Pure hypercholesterolemia, unspecified: Secondary | ICD-10-CM | POA: Diagnosis present

## 2022-01-13 DIAGNOSIS — Z7951 Long term (current) use of inhaled steroids: Secondary | ICD-10-CM

## 2022-01-13 DIAGNOSIS — Z9102 Food additives allergy status: Secondary | ICD-10-CM

## 2022-01-13 DIAGNOSIS — Z825 Family history of asthma and other chronic lower respiratory diseases: Secondary | ICD-10-CM

## 2022-01-13 DIAGNOSIS — Z8249 Family history of ischemic heart disease and other diseases of the circulatory system: Secondary | ICD-10-CM

## 2022-01-13 DIAGNOSIS — N179 Acute kidney failure, unspecified: Secondary | ICD-10-CM | POA: Diagnosis not present

## 2022-01-13 DIAGNOSIS — I959 Hypotension, unspecified: Secondary | ICD-10-CM | POA: Diagnosis present

## 2022-01-13 DIAGNOSIS — Z79890 Hormone replacement therapy (postmenopausal): Secondary | ICD-10-CM

## 2022-01-13 HISTORY — DX: Type 2 diabetes mellitus with ketoacidosis without coma: E11.10

## 2022-01-13 LAB — BASIC METABOLIC PANEL
Anion gap: 22 — ABNORMAL HIGH (ref 5–15)
Anion gap: 11 (ref 5–15)
BUN: 29 mg/dL — ABNORMAL HIGH (ref 6–20)
BUN: 39 mg/dL — ABNORMAL HIGH (ref 6–20)
CO2: 13 mmol/L — ABNORMAL LOW (ref 22–32)
CO2: 16 mmol/L — ABNORMAL LOW (ref 22–32)
Calcium: 10.5 mg/dL — ABNORMAL HIGH (ref 8.9–10.3)
Calcium: 9.3 mg/dL (ref 8.9–10.3)
Chloride: 96 mmol/L — ABNORMAL LOW (ref 98–111)
Chloride: 108 mmol/L (ref 98–111)
Creatinine, Ser: 1.65 mg/dL — ABNORMAL HIGH (ref 0.44–1.00)
Creatinine, Ser: 1.74 mg/dL — ABNORMAL HIGH (ref 0.44–1.00)
GFR, Estimated: 37 mL/min — ABNORMAL LOW (ref >60)
GFR, Estimated: 35 mL/min — ABNORMAL LOW (ref >60)
Glucose, Bld: 419 mg/dL — ABNORMAL HIGH (ref 70–99)
Glucose, Bld: 129 mg/dL — ABNORMAL HIGH (ref 70–99)
Potassium: 4.4 mmol/L (ref 3.5–5.1)
Potassium: 4 mmol/L (ref 3.5–5.1)
Sodium: 131 mmol/L — ABNORMAL LOW (ref 135–145)
Sodium: 135 mmol/L (ref 135–145)

## 2022-01-13 LAB — CBC
HCT: 40.4 % (ref 36.0–46.0)
Hemoglobin: 13.4 g/dL (ref 12.0–15.0)
MCH: 30.2 pg (ref 26.0–34.0)
MCHC: 33.2 g/dL (ref 30.0–36.0)
MCV: 91 fL (ref 80.0–100.0)
Platelets: 513 10*3/uL — ABNORMAL HIGH (ref 150–400)
RBC: 4.44 MIL/uL (ref 3.87–5.11)
RDW: 13.9 % (ref 11.5–15.5)
WBC: 17.1 10*3/uL — ABNORMAL HIGH (ref 4.0–10.5)
nRBC: 0 % (ref 0.0–0.2)

## 2022-01-13 LAB — I-STAT VENOUS BLOOD GAS, ED
Acid-base deficit: 10 mmol/L — ABNORMAL HIGH (ref 0.0–2.0)
Bicarbonate: 13.7 mmol/L — ABNORMAL LOW (ref 20.0–28.0)
Calcium, Ion: 1.17 mmol/L (ref 1.15–1.40)
HCT: 40 % (ref 36.0–46.0)
Hemoglobin: 13.6 g/dL (ref 12.0–15.0)
O2 Saturation: 68 %
Patient temperature: 97.6
Potassium: 5.5 mmol/L — ABNORMAL HIGH (ref 3.5–5.1)
Sodium: 129 mmol/L — ABNORMAL LOW (ref 135–145)
TCO2: 14 mmol/L — ABNORMAL LOW (ref 22–32)
pCO2, Ven: 23.3 mmHg — ABNORMAL LOW (ref 44–60)
pH, Ven: 7.375 (ref 7.25–7.43)
pO2, Ven: 34 mmHg (ref 32–45)

## 2022-01-13 LAB — GLUCOSE, CAPILLARY
Glucose-Capillary: 138 mg/dL — ABNORMAL HIGH (ref 70–99)
Glucose-Capillary: 149 mg/dL — ABNORMAL HIGH (ref 70–99)
Glucose-Capillary: 142 mg/dL — ABNORMAL HIGH (ref 70–99)
Glucose-Capillary: 143 mg/dL — ABNORMAL HIGH (ref 70–99)

## 2022-01-13 LAB — URINALYSIS, ROUTINE W REFLEX MICROSCOPIC
Bilirubin Urine: NEGATIVE
Glucose, UA: >=500 mg/dL — AB
Hgb urine dipstick: NEGATIVE
Ketones, ur: 20 mg/dL — AB
Leukocytes,Ua: NEGATIVE
Nitrite: NEGATIVE
Protein, ur: 30 mg/dL — AB
Specific Gravity, Urine: 1.018 (ref 1.005–1.030)
pH: 5 (ref 5.0–8.0)

## 2022-01-13 LAB — CBG MONITORING, ED
Glucose-Capillary: 409 mg/dL — ABNORMAL HIGH (ref 70–99)
Glucose-Capillary: 462 mg/dL — ABNORMAL HIGH (ref 70–99)
Glucose-Capillary: 438 mg/dL — ABNORMAL HIGH (ref 70–99)
Glucose-Capillary: 464 mg/dL — ABNORMAL HIGH (ref 70–99)
Glucose-Capillary: 355 mg/dL — ABNORMAL HIGH (ref 70–99)
Glucose-Capillary: 261 mg/dL — ABNORMAL HIGH (ref 70–99)
Glucose-Capillary: 202 mg/dL — ABNORMAL HIGH (ref 70–99)

## 2022-01-13 LAB — RAPID URINE DRUG SCREEN, HOSP PERFORMED
Amphetamines: NOT DETECTED
Barbiturates: NOT DETECTED
Benzodiazepines: NOT DETECTED
Cocaine: NOT DETECTED
Opiates: NOT DETECTED
Tetrahydrocannabinol: POSITIVE — AB

## 2022-01-13 LAB — PREGNANCY, URINE: Preg Test, Ur: NEGATIVE

## 2022-01-13 LAB — BETA-HYDROXYBUTYRIC ACID
Beta-Hydroxybutyric Acid: 4 mmol/L — ABNORMAL HIGH (ref 0.05–0.27)
Beta-Hydroxybutyric Acid: 1.57 mmol/L — ABNORMAL HIGH (ref 0.05–0.27)

## 2022-01-13 LAB — TROPONIN I (HIGH SENSITIVITY)
Troponin I (High Sensitivity): 744 ng/L (ref <18)
Troponin I (High Sensitivity): 637 ng/L (ref <18)

## 2022-01-13 LAB — MRSA NEXT GEN BY PCR, NASAL: MRSA by PCR Next Gen: NOT DETECTED

## 2022-01-13 MED ORDER — FLUTICASONE PROPIONATE 50 MCG/ACT NA SUSP
1.0000 | Freq: Every day | NASAL | Status: DC
  Administered 2022-01-14 – 2022-01-15 (×2): 1 via NASAL
  Filled 2022-01-13: qty 16

## 2022-01-13 MED ORDER — BUDESONIDE 0.25 MG/2ML IN SUSP: 0.2500 mg | Freq: Every day | RESPIRATORY_TRACT | Status: DC | PRN

## 2022-01-13 MED ORDER — ONDANSETRON HCL 4 MG PO TABS: 4.0000 mg | ORAL_TABLET | Freq: Four times a day (QID) | ORAL | Status: DC | PRN

## 2022-01-13 MED ORDER — LEVOTHYROXINE SODIUM 100 MCG PO TABS
100.0000 ug | ORAL_TABLET | Freq: Every day | ORAL | Status: DC
  Administered 2022-01-15: 100 ug via ORAL
  Filled 2022-01-13: qty 1

## 2022-01-13 MED ORDER — MONTELUKAST SODIUM 10 MG PO TABS
10.0000 mg | ORAL_TABLET | Freq: Every day | ORAL | Status: DC
  Administered 2022-01-14: 10 mg via ORAL
  Filled 2022-01-13: qty 1

## 2022-01-13 MED ORDER — KETOROLAC TROMETHAMINE 15 MG/ML IJ SOLN
15.0000 mg | Freq: Once | INTRAMUSCULAR | Status: AC
  Administered 2022-01-13: 15 mg via INTRAVENOUS
  Filled 2022-01-13: qty 1

## 2022-01-13 MED ORDER — SODIUM CHLORIDE 0.9 % IV BOLUS
1000.0000 mL | Freq: Once | INTRAVENOUS | Status: AC
  Administered 2022-01-13: 1000 mL via INTRAVENOUS

## 2022-01-13 MED ORDER — PANTOPRAZOLE SODIUM 40 MG PO TBEC: 40.0000 mg | DELAYED_RELEASE_TABLET | Freq: Every day | ORAL | Status: DC | PRN

## 2022-01-13 MED ORDER — DEXTROSE IN LACTATED RINGERS 5 % IV SOLN: INTRAVENOUS | Status: DC

## 2022-01-13 MED ORDER — ONDANSETRON HCL 4 MG/2ML IJ SOLN: 4.0000 mg | Freq: Four times a day (QID) | INTRAMUSCULAR | Status: DC | PRN

## 2022-01-13 MED ORDER — PSYLLIUM 95 % PO PACK
1.0000 | PACK | Freq: Every day | ORAL | Status: DC
  Administered 2022-01-14: 1 via ORAL
  Filled 2022-01-13 (×2): qty 1

## 2022-01-13 MED ORDER — OMEPRAZOLE MAGNESIUM 20 MG PO TBEC: 20.0000 mg | DELAYED_RELEASE_TABLET | Freq: Every day | ORAL | Status: DC | PRN

## 2022-01-13 MED ORDER — HYDRALAZINE HCL 20 MG/ML IJ SOLN
10.0000 mg | Freq: Once | INTRAMUSCULAR | Status: AC
  Administered 2022-01-13: 10 mg via INTRAVENOUS
  Filled 2022-01-13: qty 1

## 2022-01-13 MED ORDER — ORAL CARE MOUTH RINSE: 15.0000 mL | OROMUCOSAL | Status: DC | PRN

## 2022-01-13 MED ORDER — PREDNISOLONE ACETATE 1 % OP SUSP
1.0000 [drp] | Freq: Every day | OPHTHALMIC | Status: DC
  Administered 2022-01-14 – 2022-01-15 (×2): 1 [drp] via OPHTHALMIC
  Filled 2022-01-13: qty 5

## 2022-01-13 MED ORDER — DIPHENHYDRAMINE HCL 12.5 MG/5ML PO ELIX
37.5000 mg | ORAL_SOLUTION | Freq: Every day | ORAL | Status: DC
  Administered 2022-01-14: 37.5 mg via ORAL
  Filled 2022-01-13: qty 15

## 2022-01-13 MED ORDER — ACETAMINOPHEN 650 MG RE SUPP: 650.0000 mg | Freq: Four times a day (QID) | RECTAL | Status: DC | PRN

## 2022-01-13 MED ORDER — METOCLOPRAMIDE HCL 5 MG/ML IJ SOLN
5.0000 mg | Freq: Three times a day (TID) | INTRAMUSCULAR | Status: DC | PRN
  Administered 2022-01-13 – 2022-01-14 (×2): 5 mg via INTRAVENOUS
  Filled 2022-01-13 (×2): qty 2

## 2022-01-13 MED ORDER — ENOXAPARIN SODIUM 40 MG/0.4ML IJ SOSY
40.0000 mg | PREFILLED_SYRINGE | INTRAMUSCULAR | Status: DC
  Administered 2022-01-13 – 2022-01-14 (×2): 40 mg via SUBCUTANEOUS
  Filled 2022-01-13 (×2): qty 0.4

## 2022-01-13 MED ORDER — CLOPIDOGREL BISULFATE 75 MG PO TABS
75.0000 mg | ORAL_TABLET | Freq: Every day | ORAL | Status: DC
  Administered 2022-01-14: 75 mg via ORAL
  Filled 2022-01-13: qty 1

## 2022-01-13 MED ORDER — METHYLCELLULOSE (LAXATIVE) PO POWD: 1.0000 | Freq: Every day | ORAL | Status: DC

## 2022-01-13 MED ORDER — ONDANSETRON HCL 4 MG/2ML IJ SOLN
4.0000 mg | Freq: Four times a day (QID) | INTRAMUSCULAR | Status: DC | PRN
  Administered 2022-01-13: 4 mg via INTRAVENOUS
  Filled 2022-01-13: qty 2

## 2022-01-13 MED ORDER — ALBUTEROL SULFATE HFA 108 (90 BASE) MCG/ACT IN AERS: 2.0000 | INHALATION_SPRAY | Freq: Four times a day (QID) | RESPIRATORY_TRACT | Status: DC | PRN

## 2022-01-13 MED ORDER — BUPROPION HCL ER (XL) 150 MG PO TB24
150.0000 mg | ORAL_TABLET | Freq: Every day | ORAL | Status: DC
  Administered 2022-01-14 – 2022-01-15 (×2): 150 mg via ORAL
  Filled 2022-01-13 (×2): qty 1

## 2022-01-13 MED ORDER — METOCLOPRAMIDE HCL 5 MG/ML IJ SOLN
10.0000 mg | Freq: Once | INTRAMUSCULAR | Status: AC
  Administered 2022-01-13: 10 mg via INTRAVENOUS
  Filled 2022-01-13: qty 2

## 2022-01-13 MED ORDER — INSULIN REGULAR(HUMAN) IN NACL 100-0.9 UT/100ML-% IV SOLN
INTRAVENOUS | Status: DC
  Administered 2022-01-13: 4.8 [IU]/h via INTRAVENOUS
  Administered 2022-01-13: 7 [IU]/h via INTRAVENOUS
  Administered 2022-01-13: 9 [IU]/h via INTRAVENOUS
  Administered 2022-01-13: 8.5 [IU]/h via INTRAVENOUS
  Administered 2022-01-13: 2.6 [IU]/h via INTRAVENOUS
  Administered 2022-01-13: 7 [IU]/h via INTRAVENOUS
  Filled 2022-01-13: qty 100

## 2022-01-13 MED ORDER — LORAZEPAM 2 MG/ML IJ SOLN
0.2500 mg | Freq: Once | INTRAMUSCULAR | Status: AC
  Administered 2022-01-13: 0.25 mg via INTRAVENOUS
  Filled 2022-01-13: qty 1

## 2022-01-13 MED ORDER — ASPIRIN 81 MG PO TBEC
81.0000 mg | DELAYED_RELEASE_TABLET | Freq: Every day | ORAL | Status: DC
  Administered 2022-01-14 – 2022-01-15 (×2): 81 mg via ORAL
  Filled 2022-01-13 (×2): qty 1

## 2022-01-13 MED ORDER — SODIUM CHLORIDE 0.9 % IV BOLUS
500.0000 mL | Freq: Once | INTRAVENOUS | Status: AC
  Administered 2022-01-13: 500 mL via INTRAVENOUS

## 2022-01-13 MED ORDER — DULOXETINE HCL 20 MG PO CPEP
20.0000 mg | ORAL_CAPSULE | Freq: Every day | ORAL | Status: DC
  Administered 2022-01-14: 20 mg via ORAL
  Filled 2022-01-13 (×2): qty 1

## 2022-01-13 MED ORDER — LORAZEPAM 2 MG/ML IJ SOLN
0.5000 mg | Freq: Once | INTRAMUSCULAR | Status: AC
  Administered 2022-01-13: 0.5 mg via INTRAVENOUS
  Filled 2022-01-13: qty 1

## 2022-01-13 MED ORDER — ALBUTEROL SULFATE (2.5 MG/3ML) 0.083% IN NEBU
2.5000 mg | INHALATION_SOLUTION | Freq: Four times a day (QID) | RESPIRATORY_TRACT | Status: DC | PRN
  Administered 2022-01-14: 2.5 mg via RESPIRATORY_TRACT
  Filled 2022-01-13: qty 3

## 2022-01-13 MED ORDER — KETOROLAC TROMETHAMINE 30 MG/ML IJ SOLN
30.0000 mg | Freq: Once | INTRAMUSCULAR | Status: AC
  Administered 2022-01-13: 30 mg via INTRAVENOUS
  Filled 2022-01-13: qty 1

## 2022-01-13 MED ORDER — LACTATED RINGERS IV SOLN: INTRAVENOUS | Status: DC

## 2022-01-13 MED ORDER — CHLORHEXIDINE GLUCONATE CLOTH 2 % EX PADS
6.0000 | MEDICATED_PAD | Freq: Every day | CUTANEOUS | Status: DC
  Administered 2022-01-13 – 2022-01-14 (×2): 6 via TOPICAL

## 2022-01-13 MED ORDER — ACETAMINOPHEN 325 MG PO TABS: 650.0000 mg | ORAL_TABLET | Freq: Four times a day (QID) | ORAL | Status: DC | PRN

## 2022-01-13 MED ORDER — DEXTROSE 50 % IV SOLN: 0.0000 mL | INTRAVENOUS | Status: DC | PRN

## 2022-01-13 NOTE — H&P (Incomplete)
History and Physical    Patient: Jennifer Hogan KDX:[id number] DOB: 07/05/1970 DOA: 01/13/2022 DOS: the patient was seen and examined on 01/14/2022 PCP: [name], NP  Patient coming from:  [facility]  - lives alone    Chief Complaint: N/V x 2 days   HPI: Jennifer Hogan is a 51 y.o. female with medical history significant of T1DM, naxiety, CAD s/p DES x3, depression, systolic CHF, GERD, HLD, hypothyroidism, gastroparesis who presented to ED with complaints of N/V x 2 days. 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  She states a few days ago she started to get "backed up" and when this happens she will have some N/V. She usually takes Citracel, but instead took mineral oil about 4 days ago. She had some diarrhea, but still has not had a regular BM. When she gets constipated she has the nausea/vomiting. She denies any fever/chills, dysuria, open wounds, shortness of breath. Her abdomen is tender, but not sore/painful.  She has had an occasional  cough. She is a type I diabetic and has an insulin pump. She states her sugars were 500 and she got them down to 200 and when she woke up they were 500. She started a higher basal, but at this point it was time to come to ER.     Denies any fever/chills, vision changes/headaches, chest pain or palpitations, shortness of breath,  abdominal pain, dysuria or leg swelling.   She does not smoke or drink, occasionally does MJ    ER Course:  vitals: afebrile, bp: 175/117, HR; 130, RR: 22, oxygen: 100%RA Pertinent labs: wbc: 17.1, platelets: 513, sodium: 131, Co2: 13, glucose: 419, BUN: 29, creatinine: 1.65, AG: 22, troponin 744>637, beta hydroxybutyric acid: 4.00, vbg ph wnl,  In ED: started on endotool, cardiology consulted, bolused 1.5L. [facility] asked to admit.    Review of Systems: As mentioned in the history of present  illness. All other systems reviewed and are negative. Past Medical History:  Diagnosis Date   Allergy    Anemia    Anxiety    Asthma    Cataract    CHF (congestive heart failure) (HCC)    Coronary artery disease    a. s/p PTCA DES x3 in the LAD (ostial DES placed, mid DES placed, distal DES placed) and PTCA/DES x1 to intermediate branch 02/2017. b. ACS 02/08/18 with LCx stent placement.   Depression    Diabetes mellitus    type 1   DKA (diabetic ketoacidoses) 12/31/2017   Dyspnea    with exertion and dust, no oxygen   Elevated platelet count    GERD (gastroesophageal reflux disease)    Heart murmur    never caused any problems   High cholesterol    Hypothyroidism    Ischemic cardiomyopathy    a. EF low-normal with basal inferior akinesis, basal septal hypokinesis by echo 01/2018.   Myocardial infarction ([place]) 2019   Nausea and vomiting 12/04/2010   Substance abuse ([place])    marijuana   Past Surgical History:  Procedure Laterality Date   25 GAUGE PARS PLANA VITRECTOMY WITH 20 GAUGE MVR PORT Right 11/24/2021   Procedure: 25 GAUGE PARS PLANA VITRECTOMY APPROACH RIGHT EYE, FLUID FLUID EXCHANGE;  Surgeon: Jalene Mullet, MD;  Location: [place];  Service: Ophthalmology;  Laterality: Right;   AIR/FLUID EXCHANGE Right 12/27/2021   Procedure: AIR/FLUID  EXCHANGE;  Surgeon: Jalene Mullet, MD;  Location: [place];  Service: Ophthalmology;  Laterality: Right;   APPENDECTOMY     BIOPSY  09/29/2021   Procedure: BIOPSY;  Surgeon: Yetta Flock, MD;  Location: [facility];  Service: Gastroenterology;;   CATARACT EXTRACTION W/PHACO Right 11/24/2021   Procedure: CATARACT EXTRACTION AND INTRAOCULAR LENS PLACEMENT ([place]) RIGHT EYE;  Surgeon: Jalene Mullet, MD;  Location: [place];  Service: Ophthalmology;  Laterality: Right;   COLONOSCOPY     COLONOSCOPY WITH PROPOFOL N/A 09/29/2021   Procedure: COLONOSCOPY WITH PROPOFOL;  Surgeon: Yetta Flock, MD;  Location: [facility];  Service:  Gastroenterology;  Laterality: N/A;   CORONARY ARTERY BYPASS GRAFT N/A 11/04/2018   Procedure: CORONARY ARTERY BYPASS GRAFTING (CABG), ON PUMP, TIMES THREE, USING LEFT AND RIGHT INTERNAL MAMMARY ARTERIES;  Surgeon: Wonda Olds, MD;  Location: [place];  Service: Open Heart Surgery;  Laterality: N/A;   CORONARY STENT INTERVENTION N/A 03/20/2017   Procedure: DES x 3 LAD, DES OM; Surgeon: Burnell Blanks, MD;  Location: [place] [facility];  Service: Cardiovascular;  Laterality: N/A;   CORONARY/GRAFT ACUTE MI REVASCULARIZATION N/A 02/08/2018   Procedure: Coronary/Graft Acute MI Revascularization;  Surgeon: Belva Crome, MD;  Location: [place] [facility];  Service: Cardiovascular;  Laterality: N/A;   ESOPHAGOGASTRODUODENOSCOPY (EGD) WITH PROPOFOL N/A 09/29/2021   Procedure: ESOPHAGOGASTRODUODENOSCOPY (EGD) WITH PROPOFOL;  Surgeon: Yetta Flock, MD;  [facility];  Service: Gastroenterology;  Laterality: N/A;   GAS INSERTION Right 04/28/2021   Procedure: INSERTION OF GAS ( C3F8);  Surgeon: Jalene Mullet, MD;  Location: [facility];  Service: Ophthalmology;  Laterality: Right;  Right eye   GAS/FLUID EXCHANGE Right 04/28/2021   Procedure: GAS/FLUID EXCHANGE;  Surgeon: Jalene Mullet, MD;  Location: [place] [place];  Service: Ophthalmology;  Laterality: Right;  Right eye   LASER PHOTO ABLATION Right 11/24/2021   Procedure: ENDO LASER PAN PHOTOCOAGULATION;  Surgeon: Jalene Mullet, MD;  Location: [place];  Service: Ophthalmology;  Laterality: Right;   LASER PHOTO ABLATION Right 12/27/2021   Procedure: ENDO LASER;  Surgeon: Jalene Mullet, MD;  Location: [place];  Service: Ophthalmology;  Laterality: Right;   LEFT HEART CATH AND CORONARY ANGIOGRAPHY N/A 03/20/2017   Procedure: LEFT HEART CATH AND CORONARY ANGIOGRAPHY;  Surgeon: Burnell Blanks, MD;  Location: [facility];  Service: Cardiovascular;  Laterality: N/A;   LEFT HEART CATH AND CORONARY ANGIOGRAPHY N/A 02/08/2018    Procedure: LEFT HEART CATH AND CORONARY ANGIOGRAPHY;  Surgeon: Belva Crome, MD;  Location: [place] [facility];  Service: Cardiovascular;  Laterality: N/A;   LEFT HEART CATH AND CORONARY ANGIOGRAPHY N/A 10/29/2018   Procedure: LEFT HEART CATH AND CORONARY ANGIOGRAPHY;  Surgeon: Burnell Blanks, MD;  Location: [place];  Service: Cardiovascular;  Laterality: N/A;   LEFT HEART CATH AND CORS/GRAFTS ANGIOGRAPHY N/A 10/04/2020   Procedure: LEFT HEART CATH AND CORS/GRAFTS ANGIOGRAPHY;  Surgeon: Sherren Mocha, MD;  Location: [place] [facility];  Service: Cardiovascular;  Laterality: N/A;   LEFT HEART CATH AND CORS/GRAFTS ANGIOGRAPHY N/A 05/11/2021   Procedure: LEFT HEART CATH AND CORS/GRAFTS ANGIOGRAPHY;  Surgeon: Martinique, Peter M, MD;  Location: [facility];  Service: Cardiovascular;  Laterality: N/A;   PARS PLANA VITRECTOMY Right 04/28/2021   Procedure: PARS PLANA VITRECTOMY WITH 25 GAUGE REMOVAL OF TRATIONAL MEMBRANE RIGHT EYE; DRAINAGE OF SUBRETINAL FLUID RIGHT EYE;  Surgeon: Jalene Mullet, MD;  Location: [facility];  Service: Ophthalmology;  Laterality: Right;  Right eye   PARS PLANA VITRECTOMY 27 GAUGE Right 12/27/2021   Procedure: PARS PLANA VITRECTOMY 27 GAUGE;  Surgeon: Jalene Mullet, MD;  Location: [facility];  Service: Ophthalmology;  Laterality: Right;   PHOTOCOAGULATION WITH LASER Right 04/28/2021   Procedure: PHOTOCOAGULATION WITH LASER;  Surgeon: Jalene Mullet, MD;  Location: [place];  Service: Ophthalmology;  Laterality: Right;  Right eye   POLYPECTOMY  09/29/2021   Procedure: POLYPECTOMY;  Surgeon: Yetta Flock, MD;  [facility];  Service: Gastroenterology;;   TEE WITHOUT CARDIOVERSION N/A 11/04/2018   Procedure: TRANSESOPHAGEAL ECHOCARDIOGRAM (TEE);  Surgeon: Wonda Olds, MD;  Location: [facility];  Service: Open Heart Surgery;  Laterality: N/A;   Social History:  reports that she has quit smoking. Her smoking use included cigarettes. She has never  used smokeless tobacco. She reports that she does not currently use alcohol. She reports current drug use. Drug: Marijuana.  Allergies  Allergen Reactions   Atorvastatin Other (See Comments)    Myalgias    Crestor [Rosuvastatin Calcium] Other (See Comments)    Severe myalgias and joint pain   Monosodium Glutamate Nausea And Vomiting and Other (See Comments)    MSG - migraine   Zetia [Ezetimibe] Other (See Comments)    myalgias   Ciprofloxin Hcl [Ciprofloxacin] Nausea And Vomiting and Other (See Comments)    Severe migraine   Food Color Red [Red Dye] Diarrhea    Family History  Problem Relation Age of Onset   Cancer Mother        T cell lymphoma   Hypertension Mother    Hypercalcemia Mother    OCD Father    Anxiety disorder Father    Coronary artery disease Father    Congestive Heart Failure Father    Asthma Father    Hypercalcemia Father    Hypertension Father    Colon polyps Father    Depression Maternal Grandmother    Diabetes Paternal Grandmother    Stomach cancer Neg Hx    Esophageal cancer Neg Hx    Colon cancer Neg Hx    Rectal cancer Neg Hx     Prior to Admission medications   Medication Sig Start Date End Date Taking? Authorizing Provider  acetaminophen (TYLENOL) 325 MG tablet Take 650 mg by mouth daily as needed for mild pain, fever or headache.    [provider]  albuterol (VENTOLIN HFA) 108 (90 Base) MCG/ACT inhaler INHALE 2 PUFFS INTO THE LUNGS EVERY 6 (SIX) HOURS AS NEEDED FOR WHEEZING OR SHORTNESS OF BREATH. 10/25/21   Newlin, Charlane Ferretti, MD  Alirocumab (PRALUENT) 150 MG/ML SOAJ Inject 1 pen  into the skin every 14 (fourteen) days. 09/23/21   [name], MD  aspirin 81 MG EC tablet Take 1 tablet (81 mg total) by mouth daily. 12/07/17   Amin, Jeanella Flattery, MD  b complex vitamins tablet Take 1 tablet by mouth 3 (three) times a week.    [provider]  budesonide (PULMICORT) 0.25 MG/2ML nebulizer solution Take 0.25 mg by nebulization daily  as needed (shortness of breath/wheezing).    [provider]  buPROPion (WELLBUTRIN XL) 150 MG 24 hr tablet Take 1 tablet (150 mg total) by mouth every morning. 01/03/22 04/03/22  Bahraini, Sarah A  clopidogrel (PLAVIX) 75 MG tablet Take 1 tablet (75 mg total) by mouth daily with breakfast. 12/05/21   [name], MD  Continuous Blood Gluc Sensor (DEXCOM G7 SENSOR) MISC Apply 1 sensor every 10 days 01/04/22   Cruzita Lederer,  Salena Saner, MD  diclofenac Sodium (VOLTAREN) 1 % GEL Apply 2-4 g topically 3 (three) times daily as needed (knee pain).    [provider]  diphenhydrAMINE (BENADRYL) 25 MG tablet Take 37.5 mg by mouth at bedtime.    [provider]  DULoxetine (CYMBALTA) 20 MG capsule Take 1 capsule (20 mg total) by mouth daily. 01/03/22 04/03/22  Bahraini, Sarah A  erythromycin ophthalmic ointment Apply 1 cm twice daily to right eye for 1 week. 11/25/21   Jalene Mullet, MD  fluticasone Asencion Islam) 50 MCG/ACT nasal spray insert 1 spray each nostril daily for 3 days then use 2-3 times a wk as needed 01/09/22   Ladell Pier, MD  furosemide (LASIX) 20 MG tablet Take 1 tablet (20 mg total) by mouth daily as needed (for weight gain). 10/14/20   Clegg, Amy D, NP  Glucagon 3 MG/DOSE POWD Place 3 mg into the nose once as needed for up to 1 dose. 07/01/21   Philemon Kingdom, MD  hydrOXYzine (ATARAX) 25 MG tablet Take 1 tablet (25 mg total) by mouth 3 (three) times daily as needed for anxiety. 01/03/22 04/03/22  Bahraini, Sarah A  insulin lispro (HUMALOG) 100 UNIT/ML injection Inject 0.35 mLs (35 Units total) into the skin daily via pump. 12/19/21   Philemon Kingdom, MD  levothyroxine (SYNTHROID) 100 MCG tablet Take 100 mcg by mouth daily before breakfast.    [provider]  losartan (COZAAR) 25 MG tablet Take 0.5 tablets (12.5 mg total) by mouth daily. 11/03/21   Bensimhon, Shaune Pascal, MD  methylcellulose (CITRUCEL) oral powder Take 1 packet by mouth daily. 12/23/21    Armbruster, Carlota Raspberry, MD  metoCLOPramide (REGLAN) 5 MG tablet Take 1 tablet (5 mg total) by mouth every 8 (eight) hours as needed for nausea or vomiting. 12/23/21 12/23/22  Yetta Flock, MD  metoprolol succinate (TOPROL-XL) 25 MG 24 hr tablet Take 0.5 tablets (12.5 mg total) by mouth daily. 12/19/21   Bensimhon, Shaune Pascal, MD  montelukast (SINGULAIR) 10 MG tablet Take 1 tablet (10 mg total) by mouth at bedtime. 08/11/21   [name], NP  Multiple Vitamin (MULTIVITAMIN WITH MINERALS) TABS tablet Take 1 tablet by mouth daily.    [provider]  nitroGLYCERIN (NITROSTAT) 0.4 MG SL tablet Place 1 tablet (0.4 mg total) under the tongue every 5 (five) minutes as needed for chest pain. 10/14/20   Clegg, Amy D, NP  ofloxacin (OCUFLOX) 0.3 % ophthalmic solution Place 1 drop into the right eye 4 (four) times daily for 7 days. 12/28/21 01/22/22  Jalene Mullet, MD  omeprazole (PRILOSEC OTC) 20 MG tablet Take 20 mg by mouth daily as needed (acid reflux).    [provider]  OVER THE COUNTER MEDICATION Take 3 capsules by mouth daily before breakfast. [employer] Super Lysine Immune Support    [provider]  prednisoLONE acetate (PRED FORTE) 1 % ophthalmic suspension Place 1 drop into the right eye 4 (four) times daily for 7 days, THEN 1 drop 3 (three) times daily for 7 days, THEN 1 drop 2 (two) times daily for 7 days, THEN 1 drop daily for 7 days. 12/28/21 01/25/22  Jalene Mullet, MD  spironolactone (ALDACTONE) 25 MG tablet Take 12.5 mg by mouth daily.    [provider]    Physical Exam: Vitals:   01/13/22 2200 01/13/22 2258 01/13/22 2300 01/13/22 2333  BP: (!) 161/83 (!) 168/76 (!) 168/76   Pulse: (!) 114 (!) 129 (!) 124  Resp: 13 (!) 32 15   Temp:    98.2 F (36.8 C)  TempSrc:    Oral  SpO2: 100% 100% 100%   Weight:      Height:       General:  Appears calm and comfortable and is in NAD Eyes:  PERRL, EOMI, normal lids, iris. Small hemorrhage in  right eye  ENT:  grossly normal hearing, lips & tongue, mildly dry mucous membranes appropriate dentition Neck:  no LAD, masses or thyromegaly; no carotid bruits Cardiovascular:  RRR, quiet systolic murmur. No LE edema.  Respiratory:   CTA bilaterally with no wheezes/rales/rhonchi.  Normal respiratory effort. Abdomen:  soft, NT, ND, NABS Back:   normal alignment, no CVAT Skin:  no rash or induration seen on limited exam Musculoskeletal:  grossly normal tone BUE/BLE, good ROM, no bony abnormality Lower extremity:  No LE edema.  Limited foot exam with no ulcerations.  2+ distal pulses. Psychiatric:  grossly normal mood and affect, speech fluent and appropriate, AOx3 Neurologic:  CN 2-12 grossly intact, moves all extremities in coordinated fashion, sensation intact   Radiological Exams on Admission: Independently reviewed - see discussion in A/P where applicable  No results found.  EKG: Independently reviewed.  Sinus tachycardia with rate 127; nonspecific ST changes with no evidence of acute ischemia   Labs on Admission: I have personally reviewed the available labs and imaging studies at the time of the admission.  Pertinent labs:    wbc: 17.1,  platelets: 513,  sodium: 131,  Co2: 13,  glucose: 419,  BUN: 29,  creatinine: 1.65,  AG: 22,  troponin 744>637,  beta hydroxybutyric acid: 4.00,  vbg ph wnl,   Assessment and Plan: Active Problems:   DKA (diabetic ketoacidosis) (HCC)   Elevated troponin   CAD S/P percutaneous coronary angioplasty   AKI (acute kidney injury) ([place])   Chronic systolic heart failure (HCC)   Hyperlipidemia LDL goal <70   Hypothyroidism   GAD (generalized anxiety disorder)   MDD (major depressive disorder), recurrent episode, severe (HCC)   Marijuana abuse   Vitreous hemorrhage, right (HCC)    Assessment and Plan: DKA (diabetic ketoacidosis) ([place]) DKA 51 year old with history of type 1 diabetes presenting with 2 day history of nausea/vomiting  and high sugars found to be in moderate DKA on admission -obs to SDU with DKA protocol  -No indication of illness as source. States when she gets constipated she will have N/V and likely contributed to her hyperglycemia.  -zofran does not help her N/V, reglan PRN  -CXR pending, UA with no apparent infection.  -Would recommend continuing insulin drip at least until morning regardless of rapidity of closure of gap and normalization of labs -bolused in ED and IVF at 125; however, with EF of 30% reduced to IVF to 75cc/hour -LR until glucose <250 and then change to D5LR -bmp q4 hours -Discontinue insulin pump for now   Elevated troponin Troponin [phone] Cardiology consulted in ED and thought secondary to demand ischemia No heparin gtt needed Will see in AM Downtrending, no chest pain and EKG with no significant changes  AKI (acute kidney injury) ([place]) Likely prerenal in setting of N/V and DKA Bolused in ED and on IVF UA pending Strict I/O Hold nephrotoxic drugs Trend   CAD S/P percutaneous coronary angioplasty Elevated troponin, cardiology consulted likely demand ischemia. They will see tomorrow Continue medical management with ASA+plavix. Allergy to statin. On praluent.  Hold beta blocker with hypotension   Chronic systolic  heart failure ([place]) Dry on exam Echo in 04/2021: EF of 30-35% with moderately reduced LVF and global hypokinesis  Strict I/O Watch volume status closely with IVF resuscitation in ED. Rate of IVF turned down to 75cc/hour  Holding cozaar and lasix and metoprolol with AKI   Hyperlipidemia LDL goal <70 On praluent. Allergy to statin   Hypothyroidism TSH wnl in 04/2021 Continue home synthroid at 181mg/daily   GAD (generalized anxiety disorder) Continue cymbalta   MDD (major depressive disorder), recurrent episode, severe ([place] Continue wellbutrin   Marijuana abuse Check UDS. She is upfront about using this. Unsure if contributed to her nausea/vomiting.    Vitreous hemorrhage, right (HCC) S/p Vitrectomy and endolaser right eye on 12/5 Has 7 more days of steroid drops     Advance Care Planning:   Code Status: Full Code discussed with patient   Consults: cardiology   DVT Prophylaxis: lovenox   Family Communication: none   Severity of Illness: The appropriate patient status for this patient is OBSERVATION. Observation status is judged to be reasonable and necessary in order to provide the required intensity of service to ensure the patient's safety. The patient's presenting symptoms, physical exam findings, and initial radiographic and laboratory data in the context of their medical condition is felt to place them at decreased risk for further clinical deterioration. Furthermore, it is anticipated that the patient will be medically stable for discharge from the hospital within 2 midnights of admission.   Author: AOrma Flaming MD 01/14/2022 12:11 AM  For on call review [email or web address]

## 2022-01-13 NOTE — Assessment & Plan Note (Signed)
Troponin [id number] Cardiology consulted.  Secondary to demand ischemia No heparin gtt needed Downtrending, no chest pain and EKG with no significant changes

## 2022-01-13 NOTE — Assessment & Plan Note (Addendum)
DKA 51 year old with history of type 1 diabetes presenting with 2 day history of nausea/vomiting and high sugars found to be in moderate DKA on admission -obs to SDU with DKA protocol  -No indication of illness as source. States when she gets constipated she will have N/V and likely contributed to her hyperglycemia.  -zofran does not help her N/V, reglan PRN  -CXR pending, UA with no apparent infection.  -Would recommend continuing insulin drip at least until morning regardless of rapidity of closure of gap and normalization of labs -bolused in ED and IVF at 125; however, with EF of 30% reduced to IVF to 75cc/hour -LR until glucose <250 and then change to D5LR -bmp q4 hours -Discontinue insulin pump for now

## 2022-01-13 NOTE — Assessment & Plan Note (Signed)
Likely prerenal in setting of N/V and DKA Bolused in ED and on IVF UA pending Strict I/O Hold nephrotoxic drugs Trend

## 2022-01-13 NOTE — ED Triage Notes (Signed)
Pt arrived POV. Pt caox4 c/o N/V x2 days. Pt reports she is diabetic and CBG >500 at home this morning. Hx DKA. Pt has insulin pump. Multiple episodes of vomiting throughout triage. Pt tachycardic. Denies fever, congestion, cough.

## 2022-01-13 NOTE — Assessment & Plan Note (Signed)
On praluent. Allergy to statin

## 2022-01-13 NOTE — ED Notes (Signed)
17:25 Jennifer Hogan at [facility] will send transport.-ABB(NS)

## 2022-01-13 NOTE — ED Provider Notes (Addendum)
[facility] EMERGENCY DEPT Provider Note   CSN: [id number] Arrival date & time: 01/13/22  1022     History  Chief Complaint  Patient presents with  . Hyperglycemia    Jennifer Hogan is a 51 y.o. female with history of type I day is, on insulin, history of gastroparesis, history of coronary disease status post MI, on Plavix, presented emergency department complaining of nausea and vomiting for 2 days.  He says it feels like her gastroparesis.  She says she feels dehydrated.  She was concerned her blood sugar was getting high.  She does have a history of congestive heart failure last echo in April 2023 showing diffuse hypokinesis and an EF of 60 to 65%.  She subsequently had a left heart catheterization fourth at the same time which showed severe two-vessel obstructive coronary disease,, patent LIMA graft, patient RIMA to distal Lcx.  She had a CT scan of the abdomen in May 2023 which showed distention of the urinary bladder.  At that time she was admitted to the hospital for nausea and vomiting and concern for potential diabetic ketoacidosis.  She required hospitalization on an insulin infusion.  She also had experienced a and acute kidney injury with urinary retention that was relieved by In-N-Out catheterization.  HPI     Home Medications Prior to Admission medications   Medication Sig Start Date End Date Taking? Authorizing Provider  acetaminophen (TYLENOL) 325 MG tablet Take 650 mg by mouth daily as needed for mild pain, fever or headache.    [provider]  albuterol (VENTOLIN HFA) 108 (90 Base) MCG/ACT inhaler INHALE 2 PUFFS INTO THE LUNGS EVERY 6 (SIX) HOURS AS NEEDED FOR WHEEZING OR SHORTNESS OF BREATH. 10/25/21   Newlin, Charlane Ferretti, MD  Alirocumab (PRALUENT) 150 MG/ML SOAJ Inject 1 pen  into the skin every 14 (fourteen) days. 09/23/21   [name], MD  aspirin 81 MG EC tablet Take 1 tablet (81 mg total) by mouth daily. 12/07/17   Amin, Jeanella Flattery,  MD  b complex vitamins tablet Take 1 tablet by mouth 3 (three) times a week.    [provider]  budesonide (PULMICORT) 0.25 MG/2ML nebulizer solution Take 0.25 mg by nebulization daily as needed (shortness of breath/wheezing).    [provider]  buPROPion (WELLBUTRIN XL) 150 MG 24 hr tablet Take 1 tablet (150 mg total) by mouth every morning. 01/03/22 04/03/22  Bahraini, Sarah A  clopidogrel (PLAVIX) 75 MG tablet Take 1 tablet (75 mg total) by mouth daily with breakfast. 12/05/21   [name], MD  Continuous Blood Gluc Sensor (DEXCOM G7 SENSOR) MISC Apply 1 sensor every 10 days 01/04/22   Philemon Kingdom, MD  diclofenac Sodium (VOLTAREN) 1 % GEL Apply 2-4 g topically 3 (three) times daily as needed (knee pain).    [provider]  diphenhydrAMINE (BENADRYL) 25 MG tablet Take 37.5 mg by mouth at bedtime.    [provider]  DULoxetine (CYMBALTA) 20 MG capsule Take 1 capsule (20 mg total) by mouth daily. 01/03/22 04/03/22  Bahraini, Sarah A  erythromycin ophthalmic ointment Apply 1 cm twice daily to right eye for 1 week. 11/25/21   Jalene Mullet, MD  fluticasone Asencion Islam) 50 MCG/ACT nasal spray insert 1 spray each nostril daily for 3 days then use 2-3 times a wk as needed 01/09/22   Ladell Pier, MD  furosemide (LASIX) 20 MG tablet Take 1 tablet (20 mg total) by mouth daily as needed (for weight gain). 10/14/20  Clegg, Amy D, NP  Glucagon 3 MG/DOSE POWD Place 3 mg into the nose once as needed for up to 1 dose. 07/01/21   Philemon Kingdom, MD  hydrOXYzine (ATARAX) 25 MG tablet Take 1 tablet (25 mg total) by mouth 3 (three) times daily as needed for anxiety. 01/03/22 04/03/22  Bahraini, Sarah A  insulin lispro (HUMALOG) 100 UNIT/ML injection Inject 0.35 mLs (35 Units total) into the skin daily via pump. 12/19/21   Philemon Kingdom, MD  levothyroxine (SYNTHROID) 100 MCG tablet Take 100 mcg by mouth daily before breakfast.    [provider]   losartan (COZAAR) 25 MG tablet Take 0.5 tablets (12.5 mg total) by mouth daily. 11/03/21   Bensimhon, Shaune Pascal, MD  methylcellulose (CITRUCEL) oral powder Take 1 packet by mouth daily. 12/23/21   Armbruster, Carlota Raspberry, MD  metoCLOPramide (REGLAN) 5 MG tablet Take 1 tablet (5 mg total) by mouth every 8 (eight) hours as needed for nausea or vomiting. 12/23/21 12/23/22  Yetta Flock, MD  metoprolol succinate (TOPROL-XL) 25 MG 24 hr tablet Take 0.5 tablets (12.5 mg total) by mouth daily. 12/19/21   Bensimhon, Shaune Pascal, MD  montelukast (SINGULAIR) 10 MG tablet Take 1 tablet (10 mg total) by mouth at bedtime. 08/11/21   [name], NP  Multiple Vitamin (MULTIVITAMIN WITH MINERALS) TABS tablet Take 1 tablet by mouth daily.    [provider]  nitroGLYCERIN (NITROSTAT) 0.4 MG SL tablet Place 1 tablet (0.4 mg total) under the tongue every 5 (five) minutes as needed for chest pain. 10/14/20   Clegg, Amy D, NP  ofloxacin (OCUFLOX) 0.3 % ophthalmic solution Place 1 drop into the right eye 4 (four) times daily for 7 days. 12/28/21 01/22/22  Jalene Mullet, MD  omeprazole (PRILOSEC OTC) 20 MG tablet Take 20 mg by mouth daily as needed (acid reflux).    [provider]  OVER THE COUNTER MEDICATION Take 3 capsules by mouth daily before breakfast. [employer] Super Lysine Immune Support    [provider]  prednisoLONE acetate (PRED FORTE) 1 % ophthalmic suspension Place 1 drop into the right eye 4 (four) times daily for 7 days, THEN 1 drop 3 (three) times daily for 7 days, THEN 1 drop 2 (two) times daily for 7 days, THEN 1 drop daily for 7 days. 12/28/21 01/25/22  Jalene Mullet, MD  spironolactone (ALDACTONE) 25 MG tablet Take 12.5 mg by mouth daily.    [provider]      Allergies    Atorvastatin, Crestor [rosuvastatin calcium], Monosodium glutamate, Zetia [ezetimibe], Ciprofloxin hcl [ciprofloxacin], and Food color red [red dye]    Review of Systems   Review of  Systems  Physical Exam Updated Vital Signs BP 105/70   Pulse (!) 135   Temp 98.7 F (37.1 C) (Axillary)   Resp 16   Ht '5\' 5"'$  (1.651 m)   Wt 68.5 kg   LMP 11/25/2021   SpO2 100%   BMI 25.13 kg/m  Physical Exam Constitutional:      General: She is not in acute distress.    Appearance: She is diaphoretic.  HENT:     Head: Normocephalic and atraumatic.  Eyes:     Conjunctiva/sclera: Conjunctivae normal.     Pupils: Pupils are equal, round, and reactive to light.  Cardiovascular:     Rate and Rhythm: Regular rhythm. Tachycardia present.  Pulmonary:     Effort: Pulmonary effort is normal. No respiratory distress.  Abdominal:     General:  There is no distension.     Tenderness: There is no abdominal tenderness.  Skin:    General: Skin is warm.  Neurological:     General: No focal deficit present.     Mental Status: She is alert. Mental status is at baseline.  Psychiatric:        Mood and Affect: Mood normal.        Behavior: Behavior normal.     ED Results / Procedures / Treatments   Labs (all labs ordered are listed, but only abnormal results are displayed) Labs Reviewed  BASIC METABOLIC PANEL - Abnormal; Notable for the following components:      Result Value   Sodium 131 (*)    Chloride 96 (*)    CO2 13 (*)    Glucose, Bld 419 (*)    BUN 29 (*)    Creatinine, Ser 1.65 (*)    Calcium 10.5 (*)    GFR, Estimated 37 (*)    Anion gap 22 (*)    All other components within normal limits  CBC - Abnormal; Notable for the following components:   WBC 17.1 (*)    Platelets 513 (*)    All other components within normal limits  BETA-HYDROXYBUTYRIC ACID - Abnormal; Notable for the following components:   Beta-Hydroxybutyric Acid 4.00 (*)    All other components within normal limits  CBG MONITORING, ED - Abnormal; Notable for the following components:   Glucose-Capillary 409 (*)    All other components within normal limits  CBG MONITORING, ED - Abnormal; Notable for  the following components:   Glucose-Capillary 462 (*)    All other components within normal limits  I-STAT VENOUS BLOOD GAS, ED - Abnormal; Notable for the following components:   pCO2, Ven 23.3 (*)    Bicarbonate 13.7 (*)    TCO2 14 (*)    Acid-base deficit 10.0 (*)    Sodium 129 (*)    Potassium 5.5 (*)    All other components within normal limits  CBG MONITORING, ED - Abnormal; Notable for the following components:   Glucose-Capillary 464 (*)    All other components within normal limits  CBG MONITORING, ED - Abnormal; Notable for the following components:   Glucose-Capillary 438 (*)    All other components within normal limits  TROPONIN I (HIGH SENSITIVITY) - Abnormal; Notable for the following components:   Troponin I (High Sensitivity) 744 (*)    All other components within normal limits  TROPONIN I (HIGH SENSITIVITY) - Abnormal; Notable for the following components:   Troponin I (High Sensitivity) 637 (*)    All other components within normal limits  URINALYSIS, ROUTINE W REFLEX MICROSCOPIC  PREGNANCY, URINE    EKG EKG Interpretation  Date/Time:  Friday January 13 2022 10:34:49 EST Ventricular Rate:  127 PR Interval:  140 QRS Duration: 90 QT Interval:  315 QTC Calculation: 458 R Axis:   79 Text Interpretation: Sinus tachycardia  Baseline wander inferiors leads; ST depressions inferior/lateral leads noted on prior tracing May 30 2021, no STEMI Confirmed by Octaviano Glow [phone]) on 01/13/2022 10:43:34 AM  Radiology No results found.  Procedures .Critical Care  Performed by: Wyvonnia Dusky, MD Authorized by: Wyvonnia Dusky, MD   Critical care provider statement:    Critical care time (minutes):  45   Critical care time was exclusive of:  Separately billable procedures and treating other patients   Critical care was necessary to treat or prevent imminent or life-threatening deterioration of  the following conditions:  Metabolic crisis   Critical care was  time spent personally by me on the following activities:  Ordering and performing treatments and interventions, ordering and review of laboratory studies, ordering and review of radiographic studies, pulse oximetry, review of old charts, examination of patient and evaluation of patient's response to treatment Comments:     Insulin management     Medications Ordered in ED Medications  insulin regular, human (MYXREDLIN) 100 units/ 100 mL infusion (9 Units/hr Intravenous New Bag/Given 01/13/22 1519)  lactated ringers infusion ( Intravenous New Bag/Given 01/13/22 1351)  dextrose 5 % in lactated ringers infusion (has no administration in time range)  dextrose 50 % solution 0-50 mL (has no administration in time range)  sodium chloride 0.9 % bolus 1,000 mL (0 mLs Intravenous Stopped 01/13/22 1204)  metoCLOPramide (REGLAN) injection 10 mg (10 mg Intravenous Given 01/13/22 1106)  ketorolac (TORADOL) 30 MG/ML injection 30 mg (30 mg Intravenous Given 01/13/22 1104)  hydrALAZINE (APRESOLINE) injection 10 mg (10 mg Intravenous Given 01/13/22 1108)  LORazepam (ATIVAN) injection 0.5 mg (0.5 mg Intravenous Given 01/13/22 1105)    ED Course/ Medical Decision Making/ A&P Clinical Course as of 01/13/22 1539  Fri Jan 13, 2022  1107 VBG with no acidosis, bicarb low 13.7 [MT]  1205 Troponin I (High Sensitivity)(!!): 744 [MT]  1248 I spoke to Dr Gasper Sells oncall cardiology who reviewed the patient's medical workup and agree that this is likely demand ischemia from gastroparesis and diabetic ketosis.  There is no indication for emergent heparin at this time.  He agrees with trending troponin levels and medical admission, to [facility] would be okay, and management of gastroparesis and diabetic concerns.  If the patient were to develop new left-sided chest pain or concerning ischemic EKG changes (her inferior lateral ST depressions are chronic), she may require further transfer to [facility].  Otherwise  cardiology will consult on the patient when she arrives at [facility]. [MT]  1319 Anion gap(!): 22 [MT]  1351 Beta-Hydroxybutyric Acid(!): 4.00 [MT]    Clinical Course User Index [MT] Ling Flesch, Carola Rhine, MD                           Medical Decision Making Amount and/or Complexity of Data Reviewed Labs: ordered. Decision-making details documented in ED Course.  Risk Prescription drug management. Decision regarding hospitalization.   This patient presents to the ED with concern for nausea, vomiting, feeling weak. This involves an extensive number of treatment options, and is a complaint that carries with it a high risk of complications and morbidity.  The differential diagnosis includes gastroparesis most likely, which may be complicated by diabetic hyperglycemic crisis, versus DKA, versus viral gastroenteritis versus other.  She is not able to tolerate any of her home medications including her antiemetics.  IV medications been ordered here.  Co-morbidities that complicate the patient evaluation: History of gastroparesis, at risk of recurring exacerbations.  History of diabetes at risk for hyperglycemic crisis  External records from outside source obtained and reviewed including most recent echocardiogram and left heart catheterization as noted above, hospital discharge summary earlier this year for similar presentation  I ordered and personally interpreted labs.  The pertinent results include: No acidosis, leukocytosis was likely reactive, elevated troponins which are flat on repeat   The patient was maintained on a cardiac monitor.  I personally viewed and interpreted the cardiac monitored which showed an underlying rhythm of: Sinus tachycardia  Per  my interpretation the patient's ECG shows sinus tachycardia without acute ischemic findings, she is some chronic ST depressions in the inferior lateral leads which are unchanged here.  I ordered medication including IV fluids, IV Reglan, IV  Ativan, IV Toradol for nausea abdominal pain and hydration.  IV insulin infusion started for anion gap hyperglycemia  I have reviewed the patients home medicines and have made adjustments as needed  Test Considered: I suspect this is gastroparesis triggering this vomiting per history, and not in acute intra-abdominal process to require emergent CT imaging of the abdomen.  She is not having evidence of bowel obstruction.  I requested consultation with the cardiology, Dr Gasper Sells,  and discussed lab and imaging findings as well as pertinent plan - they recommend: See ED course.  Briefly medical admission and management of diabetic crisis and gastroparesis, with no indication at this time for heparin or cardiac catheterization.  This is likely a type II demand ischemia per my discussion with the cardiologist.  The patient is not having any chest pain as an ACS equivalent, only nausea and vomiting consistent with gastroparesis.  Repeat troponins are flat here.  Creatinine is also elevated nearly doubled baseline.  This could be consistent with an AKI from vomiting and dehydration.  After the interventions noted above, I reevaluated the patient and found that they have: stayed the same   Dispostion:  After consideration of the diagnostic results and the patients response to treatment, I feel that the patent would benefit from medical admission.    Final Clinical Impression(s) / ED Diagnoses Final diagnoses:  NSTEMI (non-ST elevated myocardial infarction) ([place])  Gastroparesis  Diabetic ketosis ([place])  Hyperglycemia  AKI (acute kidney injury) [facility])    Rx / DC Orders ED Discharge Orders     None         Kysean Sweet, Carola Rhine, MD 01/13/22 1536    Wyvonnia Dusky, MD 01/13/22 1539

## 2022-01-13 NOTE — Progress Notes (Signed)
51 year old with history of DM1, CAD status post MI on Plavix, gastroparesis, GERD, CHF with reduced EF 35% comes to the [place] ED with complaints of nausea, vomiting for the past 2 days.  Patient was found to be in diabetic ketoacidosis with anion gap of 22, signs of clinical dehydration with mild hyperkalemia and hyponatremia.  pH was overall unremarkable, 7.3.  Troponins were elevated at 744 and later trended down to 637. EDP spoke with cardiology, Dr. Glenford Bayley who recommended conservative management and monitoring for elevated troponin for now and they will be available for consultation.  No need for heparin drip at this time according to their service. For now patient has been started on Endo tool and will be transferred to stepdown unit for further management.  Observation orders for stepdown placed  Please notify admitting team when patient arrives  Gerlean Ren MD [facility]

## 2022-01-13 NOTE — Assessment & Plan Note (Signed)
TSH wnl in 04/2021 Continue home synthroid at 180mg/daily

## 2022-01-14 DIAGNOSIS — E039 Hypothyroidism, unspecified: Secondary | ICD-10-CM | POA: Diagnosis present

## 2022-01-14 DIAGNOSIS — Z79899 Other long term (current) drug therapy: Secondary | ICD-10-CM | POA: Diagnosis not present

## 2022-01-14 DIAGNOSIS — Z9641 Presence of insulin pump (external) (internal): Secondary | ICD-10-CM | POA: Diagnosis present

## 2022-01-14 DIAGNOSIS — E1043 Type 1 diabetes mellitus with diabetic autonomic (poly)neuropathy: Secondary | ICD-10-CM | POA: Diagnosis present

## 2022-01-14 DIAGNOSIS — Z87891 Personal history of nicotine dependence: Secondary | ICD-10-CM | POA: Diagnosis not present

## 2022-01-14 DIAGNOSIS — Z951 Presence of aortocoronary bypass graft: Secondary | ICD-10-CM | POA: Diagnosis not present

## 2022-01-14 DIAGNOSIS — K219 Gastro-esophageal reflux disease without esophagitis: Secondary | ICD-10-CM | POA: Diagnosis present

## 2022-01-14 DIAGNOSIS — E861 Hypovolemia: Secondary | ICD-10-CM | POA: Diagnosis not present

## 2022-01-14 DIAGNOSIS — I5022 Chronic systolic (congestive) heart failure: Secondary | ICD-10-CM

## 2022-01-14 DIAGNOSIS — I2510 Atherosclerotic heart disease of native coronary artery without angina pectoris: Secondary | ICD-10-CM

## 2022-01-14 DIAGNOSIS — Z794 Long term (current) use of insulin: Secondary | ICD-10-CM | POA: Diagnosis not present

## 2022-01-14 DIAGNOSIS — Z888 Allergy status to other drugs, medicaments and biological substances status: Secondary | ICD-10-CM | POA: Diagnosis not present

## 2022-01-14 DIAGNOSIS — F1210 Cannabis abuse, uncomplicated: Secondary | ICD-10-CM | POA: Diagnosis present

## 2022-01-14 DIAGNOSIS — R7989 Other specified abnormal findings of blood chemistry: Secondary | ICD-10-CM

## 2022-01-14 DIAGNOSIS — N179 Acute kidney failure, unspecified: Secondary | ICD-10-CM | POA: Diagnosis present

## 2022-01-14 DIAGNOSIS — I2489 Other forms of acute ischemic heart disease: Secondary | ICD-10-CM | POA: Diagnosis present

## 2022-01-14 DIAGNOSIS — I9589 Other hypotension: Secondary | ICD-10-CM | POA: Diagnosis not present

## 2022-01-14 DIAGNOSIS — Z881 Allergy status to other antibiotic agents status: Secondary | ICD-10-CM | POA: Diagnosis not present

## 2022-01-14 DIAGNOSIS — I959 Hypotension, unspecified: Secondary | ICD-10-CM | POA: Diagnosis present

## 2022-01-14 DIAGNOSIS — K3184 Gastroparesis: Secondary | ICD-10-CM | POA: Diagnosis present

## 2022-01-14 DIAGNOSIS — Z955 Presence of coronary angioplasty implant and graft: Secondary | ICD-10-CM | POA: Diagnosis not present

## 2022-01-14 DIAGNOSIS — Z9102 Food additives allergy status: Secondary | ICD-10-CM | POA: Diagnosis not present

## 2022-01-14 DIAGNOSIS — F332 Major depressive disorder, recurrent severe without psychotic features: Secondary | ICD-10-CM | POA: Diagnosis present

## 2022-01-14 DIAGNOSIS — E1010 Type 1 diabetes mellitus with ketoacidosis without coma: Secondary | ICD-10-CM | POA: Diagnosis present

## 2022-01-14 DIAGNOSIS — H4311 Vitreous hemorrhage, right eye: Secondary | ICD-10-CM | POA: Diagnosis present

## 2022-01-14 DIAGNOSIS — F411 Generalized anxiety disorder: Secondary | ICD-10-CM | POA: Diagnosis present

## 2022-01-14 DIAGNOSIS — I255 Ischemic cardiomyopathy: Secondary | ICD-10-CM | POA: Diagnosis present

## 2022-01-14 LAB — BASIC METABOLIC PANEL
Anion gap: 14 (ref 5–15)
Anion gap: 10 (ref 5–15)
Anion gap: 10 (ref 5–15)
Anion gap: 8 (ref 5–15)
Anion gap: 13 (ref 5–15)
BUN: 35 mg/dL — ABNORMAL HIGH (ref 6–20)
BUN: 28 mg/dL — ABNORMAL HIGH (ref 6–20)
BUN: 21 mg/dL — ABNORMAL HIGH (ref 6–20)
BUN: 20 mg/dL (ref 6–20)
BUN: 18 mg/dL (ref 6–20)
CO2: 16 mmol/L — ABNORMAL LOW (ref 22–32)
CO2: 17 mmol/L — ABNORMAL LOW (ref 22–32)
CO2: 19 mmol/L — ABNORMAL LOW (ref 22–32)
CO2: 20 mmol/L — ABNORMAL LOW (ref 22–32)
CO2: 17 mmol/L — ABNORMAL LOW (ref 22–32)
Calcium: 9.7 mg/dL (ref 8.9–10.3)
Calcium: 9 mg/dL (ref 8.9–10.3)
Calcium: 8.8 mg/dL — ABNORMAL LOW (ref 8.9–10.3)
Calcium: 8.5 mg/dL — ABNORMAL LOW (ref 8.9–10.3)
Calcium: 8.5 mg/dL — ABNORMAL LOW (ref 8.9–10.3)
Chloride: 108 mmol/L (ref 98–111)
Chloride: 108 mmol/L (ref 98–111)
Chloride: 106 mmol/L (ref 98–111)
Chloride: 109 mmol/L (ref 98–111)
Chloride: 103 mmol/L (ref 98–111)
Creatinine, Ser: 1.46 mg/dL — ABNORMAL HIGH (ref 0.44–1.00)
Creatinine, Ser: 1.24 mg/dL — ABNORMAL HIGH (ref 0.44–1.00)
Creatinine, Ser: 0.99 mg/dL (ref 0.44–1.00)
Creatinine, Ser: 0.96 mg/dL (ref 0.44–1.00)
Creatinine, Ser: 1.18 mg/dL — ABNORMAL HIGH (ref 0.44–1.00)
GFR, Estimated: 43 mL/min — ABNORMAL LOW (ref >60)
GFR, Estimated: 52 mL/min — ABNORMAL LOW (ref >60)
GFR, Estimated: >60 mL/min (ref >60)
GFR, Estimated: >60 mL/min (ref >60)
GFR, Estimated: 55 mL/min — ABNORMAL LOW (ref >60)
Glucose, Bld: 159 mg/dL — ABNORMAL HIGH (ref 70–99)
Glucose, Bld: 162 mg/dL — ABNORMAL HIGH (ref 70–99)
Glucose, Bld: 182 mg/dL — ABNORMAL HIGH (ref 70–99)
Glucose, Bld: 150 mg/dL — ABNORMAL HIGH (ref 70–99)
Glucose, Bld: 359 mg/dL — ABNORMAL HIGH (ref 70–99)
Potassium: 3.7 mmol/L (ref 3.5–5.1)
Potassium: 3.7 mmol/L (ref 3.5–5.1)
Potassium: 3.6 mmol/L (ref 3.5–5.1)
Potassium: 3.5 mmol/L (ref 3.5–5.1)
Potassium: 3.2 mmol/L — ABNORMAL LOW (ref 3.5–5.1)
Sodium: 138 mmol/L (ref 135–145)
Sodium: 135 mmol/L (ref 135–145)
Sodium: 135 mmol/L (ref 135–145)
Sodium: 137 mmol/L (ref 135–145)
Sodium: 133 mmol/L — ABNORMAL LOW (ref 135–145)

## 2022-01-14 LAB — GLUCOSE, CAPILLARY
Glucose-Capillary: 142 mg/dL — ABNORMAL HIGH (ref 70–99)
Glucose-Capillary: 147 mg/dL — ABNORMAL HIGH (ref 70–99)
Glucose-Capillary: 155 mg/dL — ABNORMAL HIGH (ref 70–99)
Glucose-Capillary: 157 mg/dL — ABNORMAL HIGH (ref 70–99)
Glucose-Capillary: 160 mg/dL — ABNORMAL HIGH (ref 70–99)
Glucose-Capillary: 171 mg/dL — ABNORMAL HIGH (ref 70–99)
Glucose-Capillary: 171 mg/dL — ABNORMAL HIGH (ref 70–99)
Glucose-Capillary: 184 mg/dL — ABNORMAL HIGH (ref 70–99)
Glucose-Capillary: 190 mg/dL — ABNORMAL HIGH (ref 70–99)
Glucose-Capillary: 366 mg/dL — ABNORMAL HIGH (ref 70–99)

## 2022-01-14 LAB — CBC
HCT: 35.1 % — ABNORMAL LOW (ref 36.0–46.0)
Hemoglobin: 11.8 g/dL — ABNORMAL LOW (ref 12.0–15.0)
MCH: 31 pg (ref 26.0–34.0)
MCHC: 33.6 g/dL (ref 30.0–36.0)
MCV: 92.1 fL (ref 80.0–100.0)
Platelets: 433 10*3/uL — ABNORMAL HIGH (ref 150–400)
RBC: 3.81 MIL/uL — ABNORMAL LOW (ref 3.87–5.11)
RDW: 13.9 % (ref 11.5–15.5)
WBC: 14.8 10*3/uL — ABNORMAL HIGH (ref 4.0–10.5)
nRBC: 0 % (ref 0.0–0.2)

## 2022-01-14 LAB — HEPATIC FUNCTION PANEL
ALT: 17 U/L (ref 0–44)
AST: 26 U/L (ref 15–41)
Albumin: 3.5 g/dL (ref 3.5–5.0)
Alkaline Phosphatase: 72 U/L (ref 38–126)
Bilirubin, Direct: 0.1 mg/dL (ref 0.0–0.2)
Indirect Bilirubin: 0.5 mg/dL (ref 0.3–0.9)
Total Bilirubin: 0.6 mg/dL (ref 0.3–1.2)
Total Protein: 6.9 g/dL (ref 6.5–8.1)

## 2022-01-14 LAB — BETA-HYDROXYBUTYRIC ACID
Beta-Hydroxybutyric Acid: 2.4 mmol/L — ABNORMAL HIGH (ref 0.05–0.27)
Beta-Hydroxybutyric Acid: 2.22 mmol/L — ABNORMAL HIGH (ref 0.05–0.27)

## 2022-01-14 LAB — D-DIMER, QUANTITATIVE: D-Dimer, Quant: 0.88 ug/mL-FEU — ABNORMAL HIGH (ref 0.00–0.50)

## 2022-01-14 MED ORDER — METOPROLOL SUCCINATE ER 25 MG PO TB24: 25.0000 mg | ORAL_TABLET | Freq: Every day | ORAL | Status: DC

## 2022-01-14 MED ORDER — LORATADINE 10 MG PO TABS
10.0000 mg | ORAL_TABLET | Freq: Every day | ORAL | Status: DC
  Administered 2022-01-14 – 2022-01-15 (×2): 10 mg via ORAL
  Filled 2022-01-14 (×2): qty 1

## 2022-01-14 MED ORDER — LACTATED RINGERS IV BOLUS
500.0000 mL | Freq: Once | INTRAVENOUS | Status: AC
  Administered 2022-01-14: 500 mL via INTRAVENOUS

## 2022-01-14 MED ORDER — SODIUM CHLORIDE 0.9 % IV BOLUS
250.0000 mL | Freq: Once | INTRAVENOUS | Status: AC
  Administered 2022-01-14: 250 mL via INTRAVENOUS

## 2022-01-14 MED ORDER — LACTATED RINGERS IV BOLUS
250.0000 mL | Freq: Once | INTRAVENOUS | Status: AC
  Administered 2022-01-14: 250 mL via INTRAVENOUS

## 2022-01-14 MED ORDER — METOPROLOL SUCCINATE ER 25 MG PO TB24
12.5000 mg | ORAL_TABLET | Freq: Every day | ORAL | Status: DC
  Filled 2022-01-14: qty 1

## 2022-01-14 MED ORDER — METOPROLOL TARTRATE 5 MG/5ML IV SOLN
2.5000 mg | Freq: Once | INTRAVENOUS | Status: AC
  Administered 2022-01-14: 2.5 mg via INTRAVENOUS
  Filled 2022-01-14: qty 5

## 2022-01-14 MED ORDER — INSULIN PUMP
SUBCUTANEOUS | Status: DC
  Administered 2022-01-14: 0.9 via SUBCUTANEOUS
  Filled 2022-01-14: qty 1

## 2022-01-14 NOTE — Assessment & Plan Note (Signed)
S/p Vitrectomy and endolaser right eye on 12/5 Has 7 more days of steroid drops

## 2022-01-14 NOTE — Assessment & Plan Note (Addendum)
Dry appearing on admission exam. Echo in 04/2021: EF of 30-35% with moderately reduced LVF and global hypokinesis  Strict I/O Watch volume status closely with IVF  Holding cozaar and lasix and metoprolol with AKI, now also hypotensive. Cardiology following - see their recommendations

## 2022-01-14 NOTE — Consult Note (Signed)
CARDIOLOGY CONSULT NOTE       [name] MRN: [id number] DOB/AGE: 1970/09/12 51 y.o.  Admit date: 01/13/2022 Referring Physician: Eliberto Ivory Primary Physician: [name], NP Primary Cardiologist: Turner/Bensimhon Reason for Consultation: Elevated troponin  Active Problems:   Hypothyroidism   CAD S/P percutaneous coronary angioplasty   Marijuana abuse   Hyperlipidemia LDL goal <28   Chronic systolic heart failure ([place])   AKI (acute kidney injury) ([place])   MDD (major depressive disorder), recurrent episode, severe (HCC)   GAD (generalized anxiety disorder)   DKA (diabetic ketoacidosis) ([place])   Elevated troponin   Vitreous hemorrhage, right (HCC)   HPI:  51 y.o. with history of type one DM, admitted with poor diet and DKA with nausea and vomiting She has had constipation and gastroparesis BS > 500 She has an insulin pump Urine drug screen positive for BZ, Opiates and THC Rx with LR and insulin drip No chest pain Troponin [phone].  Post CABG with stents. Cath 05/11/21 with patent LIMA to IM/LAD and patent RIMA to OM No significant dx in RCA TTE 05/11/21 EF 30-35% mild MR  Repeat TTE pending this admission Multiple similar admissions with DKA and elevated troponin 09/2020 and 04/2021 , 05/2021 see cath report above. No aladactone due to hyperkalemia not a candidate for SGLT2 due to DMI  Currently with no dyspnea and not volume overloaded CXR NAD  ECG ST no acute changes Primary service discussed with Dr Marlou Porch yesterday no heparin ? Demand ischemia    ROS All other systems reviewed and negative except as noted above  Past Medical History:  Diagnosis Date   Allergy    Anemia    Anxiety    Asthma    Cataract    CHF (congestive heart failure) (HCC)    Coronary artery disease    a. s/p PTCA DES x3 in the LAD (ostial DES placed, mid DES placed, distal DES placed) and PTCA/DES x1 to intermediate branch 02/2017. b. ACS 02/08/18 with LCx stent placement.   Depression     Diabetes mellitus    type 1   DKA (diabetic ketoacidoses) 12/31/2017   Dyspnea    with exertion and dust, no oxygen   Elevated platelet count    GERD (gastroesophageal reflux disease)    Heart murmur    never caused any problems   High cholesterol    Hypothyroidism    Ischemic cardiomyopathy    a. EF low-normal with basal inferior akinesis, basal septal hypokinesis by echo 01/2018.   Myocardial infarction ([place]) 2019   Nausea and vomiting 12/04/2010   Substance abuse ([place])    marijuana    Family History  Problem Relation Age of Onset   Cancer Mother        T cell lymphoma   Hypertension Mother    Hypercalcemia Mother    OCD Father    Anxiety disorder Father    Coronary artery disease Father    Congestive Heart Failure Father    Asthma Father    Hypercalcemia Father    Hypertension Father    Colon polyps Father    Depression Maternal Grandmother    Diabetes Paternal Grandmother    Stomach cancer Neg Hx    Esophageal cancer Neg Hx    Colon cancer Neg Hx    Rectal cancer Neg Hx     Social History   Socioeconomic History   Marital status: Divorced    Spouse name: Not on file   Number of  children: 0   Years of education: Not on file   Highest education level: Professional school degree (e.g., MD, DDS, DVM, JD)  Occupational History   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 3 times weekly; reports decreasing use   Sexual activity: Not Currently    Birth control/protection: None  Other Topics Concern   Not on file  Social History Narrative    Not on file   Social Determinants of Health   Financial Resource Strain: High Risk (10/05/2020)   Overall Financial Resource Strain (CARDIA)    Difficulty of Paying Living Expenses: Very hard  Food Insecurity: No Food Insecurity (01/14/2022)   Hunger Vital Sign    Worried About Running Out of Food in the Last Year: Never true    Ran Out of Food in the Last Year: Never true  Transportation Needs: No Transportation Needs (01/14/2022)   PRAPARE - Hydrologist (Medical): No    Lack of Transportation (Non-Medical): No  Physical Activity: Not on file  Stress: Not on file  Social Connections: Unknown (12/07/2017)   Social Connection and Isolation Panel [NHANES]    Frequency of Communication with Friends and Family: Patient refused    Frequency of Social Gatherings with Friends and Family: Patient refused    Attends Religious Services: Patient refused    Active Member of Clubs or Organizations: Patient refused    Attends Archivist Meetings: Patient refused    Marital Status: Patient refused  Intimate Partner Violence: Not At Risk (01/14/2022)   Humiliation, Afraid, Rape, and Kick questionnaire    Fear of Current or Ex-Partner: No    Emotionally Abused: No    Physically Abused: No    Sexually Abused: No    Past Surgical History:  Procedure Laterality Date   25 GAUGE PARS PLANA VITRECTOMY WITH 20 GAUGE MVR PORT Right 11/24/2021   Procedure: 25 GAUGE PARS PLANA VITRECTOMY APPROACH RIGHT EYE, FLUID FLUID EXCHANGE;  Surgeon: Jalene Mullet, MD;  Location: [place];  Service: Ophthalmology;  Laterality: Right;   AIR/FLUID EXCHANGE Right 12/27/2021   Procedure: AIR/FLUID EXCHANGE;  Surgeon: Jalene Mullet, MD;  Location: [place];  Service: Ophthalmology;  Laterality: Right;   APPENDECTOMY     BIOPSY  09/29/2021   Procedure: BIOPSY;  Surgeon: Yetta Flock, MD;  Location: [facility];  Service: Gastroenterology;;   CATARACT EXTRACTION W/PHACO Right  11/24/2021   Procedure: CATARACT EXTRACTION AND INTRAOCULAR LENS PLACEMENT ([place]) RIGHT EYE;  Surgeon: Jalene Mullet, MD;  Location: [place];  Service: Ophthalmology;  Laterality: Right;   COLONOSCOPY     COLONOSCOPY WITH PROPOFOL N/A 09/29/2021   Procedure: COLONOSCOPY WITH PROPOFOL;  Surgeon: Yetta Flock, MD;  Location: [facility];  Service: Gastroenterology;  Laterality: N/A;   CORONARY ARTERY BYPASS GRAFT N/A 11/04/2018   Procedure: CORONARY ARTERY BYPASS GRAFTING (CABG), ON PUMP, TIMES THREE, USING LEFT AND RIGHT INTERNAL MAMMARY ARTERIES;  Surgeon: Wonda Olds,  MD;  [facility];  Service: Open Heart Surgery;  Laterality: N/A;   CORONARY STENT INTERVENTION N/A 03/20/2017   Procedure: DES x 3 LAD, DES OM; Surgeon: Burnell Blanks, MD;  Location: [place] [facility];  Service: Cardiovascular;  Laterality: N/A;   CORONARY/GRAFT ACUTE MI REVASCULARIZATION N/A 02/08/2018   Procedure: Coronary/Graft Acute MI Revascularization;  Surgeon: Belva Crome, MD;  Location: [place] [facility];  Service: Cardiovascular;  Laterality: N/A;   ESOPHAGOGASTRODUODENOSCOPY (EGD) WITH PROPOFOL N/A 09/29/2021   Procedure: ESOPHAGOGASTRODUODENOSCOPY (EGD) WITH PROPOFOL;  Surgeon: Yetta Flock, MD;  [facility];  Service: Gastroenterology;  Laterality: N/A;   GAS INSERTION Right 04/28/2021   Procedure: INSERTION OF GAS ( C3F8);  Surgeon: Jalene Mullet, MD;  Location: [place];  Service: Ophthalmology;  Laterality: Right;  Right eye   GAS/FLUID EXCHANGE Right 04/28/2021   Procedure: GAS/FLUID EXCHANGE;  Surgeon: Jalene Mullet, MD;  Location: [place];  Service: Ophthalmology;  Laterality: Right;  Right eye   LASER PHOTO ABLATION Right 11/24/2021   Procedure: ENDO LASER PAN PHOTOCOAGULATION;  Surgeon: Jalene Mullet, MD;  Location: [place];  Service: Ophthalmology;  Laterality: Right;   LASER PHOTO ABLATION Right 12/27/2021   Procedure: ENDO LASER;  Surgeon: Jalene Mullet, MD;   Location: [place];  Service: Ophthalmology;  Laterality: Right;   LEFT HEART CATH AND CORONARY ANGIOGRAPHY N/A 03/20/2017   Procedure: LEFT HEART CATH AND CORONARY ANGIOGRAPHY;  Surgeon: Burnell Blanks, MD;  Location: [place] [facility];  Service: Cardiovascular;  Laterality: N/A;   LEFT HEART CATH AND CORONARY ANGIOGRAPHY N/A 02/08/2018   Procedure: LEFT HEART CATH AND CORONARY ANGIOGRAPHY;  Surgeon: Belva Crome, MD;  Location: [place] [facility];  Service: Cardiovascular;  Laterality: N/A;   LEFT HEART CATH AND CORONARY ANGIOGRAPHY N/A 10/29/2018   Procedure: LEFT HEART CATH AND CORONARY ANGIOGRAPHY;  Surgeon: Burnell Blanks, MD;  Location: [place] [facility];  Service: Cardiovascular;  Laterality: N/A;   LEFT HEART CATH AND CORS/GRAFTS ANGIOGRAPHY N/A 10/04/2020   Procedure: LEFT HEART CATH AND CORS/GRAFTS ANGIOGRAPHY;  Surgeon: Sherren Mocha, MD;  Location: [place] [facility];  Service: Cardiovascular;  Laterality: N/A;   LEFT HEART CATH AND CORS/GRAFTS ANGIOGRAPHY N/A 05/11/2021   Procedure: LEFT HEART CATH AND CORS/GRAFTS ANGIOGRAPHY;  Surgeon: [place], Ulah Olmo M, MD;  Location: [place] [facility];  Service: Cardiovascular;  Laterality: N/A;   PARS PLANA VITRECTOMY Right 04/28/2021   Procedure: PARS PLANA VITRECTOMY WITH 25 GAUGE REMOVAL OF TRATIONAL MEMBRANE RIGHT EYE; DRAINAGE OF SUBRETINAL FLUID RIGHT EYE;  Surgeon: Jalene Mullet, MD;  Location: [place];  Service: Ophthalmology;  Laterality: Right;  Right eye   PARS PLANA VITRECTOMY 27 GAUGE Right 12/27/2021   Procedure: PARS PLANA VITRECTOMY 27 GAUGE;  Surgeon: Jalene Mullet, MD;  Location: [place];  Service: Ophthalmology;  Laterality: Right;   PHOTOCOAGULATION WITH LASER Right 04/28/2021   Procedure: PHOTOCOAGULATION WITH LASER;  Surgeon: Jalene Mullet, MD;  Location: [employer];  Service: Ophthalmology;  Laterality: Right;  Right eye   POLYPECTOMY  09/29/2021   Procedure: POLYPECTOMY;  Surgeon: Yetta Flock, MD;   [facility];  Service: Gastroenterology;;   TEE WITHOUT CARDIOVERSION N/A 11/04/2018   Procedure: TRANSESOPHAGEAL ECHOCARDIOGRAM (TEE);  Surgeon: Wonda Olds, MD;  Location: [place];  Service: Open Heart Surgery;  Laterality: N/A;      Current Facility-Administered Medications:    acetaminophen (TYLENOL) tablet 650 mg, 650 mg, Oral, Q6H PRN **OR** acetaminophen (TYLENOL) suppository  650 mg, 650 mg, Rectal, Q6H PRN, Orma Flaming, MD   albuterol (PROVENTIL) (2.5 MG/3ML) 0.083% nebulizer solution 2.5 mg, 2.5 mg, Nebulization, Q6H PRN, Orma Flaming, MD   aspirin EC tablet 81 mg, 81 mg, Oral, Daily, Orma Flaming, MD   budesonide (PULMICORT) nebulizer solution 0.25 mg, 0.25 mg, Nebulization, Daily PRN, Orma Flaming, MD   buPROPion (WELLBUTRIN XL) 24 hr tablet 150 mg, 150 mg, Oral, Daily, Orma Flaming, MD   Chlorhexidine Gluconate Cloth 2 % PADS 6 each, 6 each, Topical, Q2200, Damita Lack, MD, 6 each at 01/13/22 1839   clopidogrel (PLAVIX) tablet 75 mg, 75 mg, Oral, QHS, Orma Flaming, MD   dextrose 5 % in lactated ringers infusion, , Intravenous, Continuous, Orma Flaming, MD, Last Rate: 75 mL/hr at 01/14/22 0800, Infusion Verify at 01/14/22 0800   dextrose 50 % solution 0-50 mL, 0-50 mL, Intravenous, PRN, [place] Masker, Carola Rhine, MD   diphenhydrAMINE (BENADRYL) 12.5 MG/5ML elixir 37.5 mg, 37.5 mg, Oral, QHS, Orma Flaming, MD   DULoxetine (CYMBALTA) DR capsule 20 mg, 20 mg, Oral, QHS, Orma Flaming, MD   enoxaparin (LOVENOX) injection 40 mg, 40 mg, Subcutaneous, Q24H, Orma Flaming, MD, 40 mg at 01/13/22 2151   fluticasone (FLONASE) 50 MCG/ACT nasal spray 1 spray, 1 spray, Each Nare, Daily, Orma Flaming, MD   insulin regular, human (MYXREDLIN) 100 units/ 100 mL infusion, , Intravenous, Continuous, Trifan, Carola Rhine, MD, Last Rate: 1.2 mL/hr at 01/14/22 0800, 1.2 Units/hr at 01/14/22 0800   lactated ringers infusion, , Intravenous, Continuous, Orma Flaming, MD,  Stopped at 01/13/22 1707   levothyroxine (SYNTHROID) tablet 100 mcg, 100 mcg, Oral, QAC breakfast, Orma Flaming, MD   metoCLOPramide [facility]) injection 5 mg, 5 mg, Intravenous, Q8H PRN, Orma Flaming, MD, 5 mg at 01/13/22 2150   metoprolol succinate (TOPROL-XL) 24 hr tablet 12.5 mg, 12.5 mg, Oral, Daily, Orma Flaming, MD   montelukast (SINGULAIR) tablet 10 mg, 10 mg, Oral, QHS, Orma Flaming, MD   ondansetron [facility]) injection 4 mg, 4 mg, Intravenous, Q6H PRN, Raenette Rover, NP, 4 mg at 01/13/22 2338   Oral care mouth rinse, 15 mL, Mouth Rinse, PRN, Amin, Ankit Chirag, MD   pantoprazole (PROTONIX) EC tablet 40 mg, 40 mg, Oral, Daily PRN, Orma Flaming, MD   prednisoLONE acetate (PRED FORTE) 1 % ophthalmic suspension 1 drop, 1 drop, Right Eye, Daily, Orma Flaming, MD   psyllium (HYDROCIL/METAMUCIL) 1 packet, 1 packet, Oral, Daily, Orma Flaming, MD  aspirin EC  81 mg Oral Daily   buPROPion  150 mg Oral Daily   Chlorhexidine Gluconate Cloth  6 each Topical Q2200   clopidogrel  75 mg Oral QHS   diphenhydrAMINE  37.5 mg Oral QHS   DULoxetine  20 mg Oral QHS   enoxaparin (LOVENOX) injection  40 mg Subcutaneous Q24H   fluticasone  1 spray Each Nare Daily   levothyroxine  100 mcg Oral QAC breakfast   metoprolol succinate  12.5 mg Oral Daily   montelukast  10 mg Oral QHS   prednisoLONE acetate  1 drop Right Eye Daily   psyllium  1 packet Oral Daily    dextrose 5% lactated ringers 75 mL/hr at 01/14/22 0800   insulin 1.2 Units/hr (01/14/22 0800)   lactated ringers Stopped (01/13/22 1707)    Physical Exam: BP (!) 153/84 (BP Location: Left Arm)   Pulse (!) 115   Temp 98.9 F (37.2 C) (Oral)   Resp 20   Ht '5\' 5"'$  (1.651 m)   Wt 64.9 kg  LMP 11/25/2021   SpO2 100%   BMI 23.81 kg/m     Chronically ill Pale female Lungs clear No murmur  Abdomen benign Post sternotomy  No edema   Labs:   Lab Results  Component Value Date   WBC 14.8 (H) 01/14/2022   HGB 11.8 (L)  01/14/2022   HCT 35.1 (L) 01/14/2022   MCV 92.1 01/14/2022   PLT 433 (H) 01/14/2022    Recent Labs  Lab 01/14/22 0409  NA 135  K 3.7  CL 108  CO2 17*  BUN 28*  CREATININE 1.24*  CALCIUM 9.0  GLUCOSE 162*   Lab Results  Component Value Date   TROPONINI 0.22 (HH) 05/25/2018    Lab Results  Component Value Date   CHOL 298 (H) 08/15/2021   CHOL 258 (H) 05/11/2021   CHOL 236 (H) 03/01/2021   Lab Results  Component Value Date   HDL 76 08/15/2021   HDL 78 05/11/2021   HDL 65 03/01/2021   Lab Results  Component Value Date   LDLCALC 203 (H) 08/15/2021   LDLCALC 167 (H) 05/11/2021   LDLCALC 155 (H) 03/01/2021   Lab Results  Component Value Date   TRIG 111 08/15/2021   TRIG 64 05/11/2021   TRIG 90 03/01/2021   Lab Results  Component Value Date   CHOLHDL 3.9 08/15/2021   CHOLHDL 3.3 05/11/2021   CHOLHDL 3.6 03/01/2021   Lab Results  Component Value Date   LDLDIRECT 109 (H) 12/22/2019      Radiology: DG CHEST PORT 1 VIEW  Result Date: 01/14/2022 CLINICAL DATA:  Diabetic ketoacidosis. EXAM: PORTABLE CHEST 1 VIEW COMPARISON:  05/30/2021. FINDINGS: The heart size and mediastinal contours are within normal limits. Both lungs are clear. Sternotomy wires are noted. No acute osseous abnormality. IMPRESSION: No active disease. Electronically Signed   By: Brett Fairy M.D.   On: 01/14/2022 00:32    EKG: See HPI   ASSESSMENT AND PLAN:   Elevated troponin:  no chest pain, no acute ECG changes Frequent admission with DKA and elevated troponin. Cath 05/11/21 in similar setting with no native RCA dx and patent RIMA to OM patent LIMA to IM/LAD Continue ASA/Plavix Increase Toprol to 25 mg daily No plans for repeat cath or ischemic w/u  DKA:  per primary service insulin drip transition back to her insulin pump Thyroid:  continue synthroid replacement    Gastroparesis:  continue Zofran and reglan follow QT interval on ECG  Ischemic DCM:  TTE pending EF 30-35% not volume  overloaded has failed Rx with aldactone with hyperkalemia No SGlT2 with DMI, Resume losartan in am if Cr stable   Signed: Jenkins Rouge 01/14/2022, 9:27 AM

## 2022-01-14 NOTE — Assessment & Plan Note (Signed)
Check UDS. She is upfront about using this. Unsure if contributed to her nausea/vomiting.

## 2022-01-14 NOTE — Assessment & Plan Note (Signed)
Continue cymbalta  

## 2022-01-14 NOTE — Progress Notes (Signed)
Inpatient Diabetes Program Recommendations  AACE/ADA: New Consensus Statement on Inpatient Glycemic Control (2015)  Target Ranges:  Prepandial:   less than 140 mg/dL      Peak postprandial:   less than 180 mg/dL (1-2 hours)      Critically ill patients:  140 - 180 mg/dL   Lab Results  Component Value Date   GLUCAP 147 (H) 01/14/2022   HGBA1C 8.0 (A) 01/04/2022    Review of Glycemic Control  Latest Reference Range & Units 01/14/22 03:14 01/14/22 05:48 01/14/22 07:53  Glucose-Capillary 70 - 99 mg/dL 155 (H) 157 (H) 147 (H)   Diabetes history: DM 1 Outpatient Diabetes medications: Medtronic insulin pump: Pump settings -changes made by her since last visit-bold: - basal rates: 12 am: 0.65 units/h 6 am: 0.675 10 am: 0.800 >> 0.700 10 pm: 0.625 >> 0.700 - ICR: 1:16 - target: 100-120 - ISF: 55 >> 65 - Insulin on Board: 4 h - bolus wizard: on - extended bolusing: not using - changes infusion site: q5-7 days TDD from basal insulin: 60% >> 62% TDD from bolus insulin: 40% >> 38% Total daily dose: 25-50 units Current orders for Inpatient glycemic control:  IV insulin-  Inpatient Diabetes Program Recommendations:    Spoke to patient regarding insulin pump.  She states that she does not think it was a pump failure bt more related to the gastroparesis, nausea, and dehydration.  Patient removed old pump site while we were talking by phone and the catheter was not kinked. She states that the insulin pump appears to be working as well.  Explained that labs will be checked and that MD will decide when to transition off insulin drip.  She has all supplies and is currently doing fingersticks instead of using sensor.   Notified MD.    Thanks,  Adah Perl, RN, BC-ADM Inpatient Diabetes Coordinator Pager [phone]  (8a-5p)

## 2022-01-14 NOTE — Assessment & Plan Note (Signed)
Initial BP's were quite elevated with isolated episode of hypotension.  Today, 12/23 - pt hypotensive requiring cautious fluid boluses. Did not improve after 250 cc LR Will give another 500 cc bolus Intensivist notified in case of need for pressors. Unclear etiology since she's been fluid resuscitated for DKA. Check D-dimer for completion sake but doubt PE/DVT clinically.

## 2022-01-14 NOTE — Assessment & Plan Note (Addendum)
Elevated troponin on admission Cardiology consulted. Due to demand ischemia most likely. Continue medical management with ASA+plavix. Allergy to statin. On praluent.  Hold beta blocker with hypotension  Follow up pending TTE.

## 2022-01-14 NOTE — Progress Notes (Signed)
Progress Note   Patient: Jennifer Hogan DOB: 02/10/1970 DOA: 01/13/2022     0 DOS: the patient was seen and examined on 01/14/2022   Brief hospital course: HPI on admission: "Jennifer Hogan is a 51 y.o. female with medical history significant of T1DM, naxiety, CAD s/p DES x3, depression, systolic CHF (EF 11-57%), GERD, HLD, hypothyroidism, gastroparesis who presented to ED 01/13/2022 for evaluation of persistent N/V x 2 days. 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  She states a few days ago she started to get "backed up" and when this happens she will have some N/V.  ... She is a type I diabetic and has an insulin pump. She states her sugars were 500 and she got them down to 200 and when she woke up they were 500. She started a higher basal, but at this point it was time to come to ER. "    ER Course:  afebrile, BP 175/117, HR; 130, RR: 22 Pertinent labs: WBC: 17.1, platelets: 513, sodium: 131, Co2: 13 (gap 22), glucose: 419, BUN: 29, creatinine: 1.65, troponin 744>637, beta hydroxybutyric acid: 4.00, VBG normal pH  Started on insulin drip and admitted to stepdown unit for further management of DKA.    12/23: BP's had been elevated, but pt hypotensive this AM.  Getting cautious fluid bolus/es due to low EF.  Intensitivist updated in case of need for pressors for maintain MAP>65.   Assessment and Plan: DKA (diabetic ketoacidosis) ([place]) DKA - POA, resolving. In setting of N/V illness. Pending next BMP, expect able to transition off insulin gtt to pump today. --Appreciate diabetes coordinator's input --Follow serial BMP's --Once DKA resolves, resume insulin pump per protocol --Adjust pump based on finger sticks --Hypoglycemia protocol --Continue close monitoring in stepdown due to hypotension   Elevated troponin Troponin  [phone] Cardiology consulted.  Secondary to demand ischemia No heparin gtt needed Downtrending, no chest pain and EKG with no significant changes  Hypotension Initial BP's were quite elevated with isolated episode of hypotension.  Today, 12/23 - pt hypotensive requiring cautious fluid boluses. Did not improve after 250 cc LR Will give another 500 cc bolus Intensivist notified in case of need for pressors. Unclear etiology since she's been fluid resuscitated for DKA. Check D-dimer for completion sake but doubt PE/DVT clinically.  AKI (acute kidney injury) ([place]) Initial Cr 1.65, baseline around 0.9. Prerenal in setting of N/V and DKA AKI resolved with IV fluids. Monitor BMP.  CAD S/P percutaneous coronary angioplasty Elevated troponin on admission Cardiology consulted. Due to demand ischemia most likely. Continue medical management with ASA+plavix. Allergy to statin. On praluent.  Hold beta blocker with hypotension  Follow up pending TTE.  Chronic systolic heart failure (HCC) Dry appearing on admission exam. Echo in 04/2021: EF of 30-35% with moderately reduced LVF and global hypokinesis  Strict I/O Watch volume status closely with IVF  Holding cozaar and lasix and metoprolol with AKI, now also hypotensive. Cardiology following - see their recommendations  Hyperlipidemia LDL goal <70 On praluent. Allergy to statin   Hypothyroidism TSH wnl in 04/2021 Continue home synthroid at 143mg/daily   GAD (generalized anxiety disorder) Continue cymbalta   MDD (major depressive disorder), recurrent episode, severe ([place] Continue wellbutrin   Marijuana abuse Check UDS. She is upfront about using this. Unsure if contributed to her nausea/vomiting.   Vitreous hemorrhage,  right [facility]) S/p Vitrectomy and endolaser right eye on 12/5 Has 7 more days of steroid drops         Subjective: Pt seen in stepdown this AM.  She kept her eyes closed but would respond to me, minimally.   Denied feeling dizzy or lightheaded with low BP's.  Endorsed N/V recently, currently no nausea.  Physical Exam: Vitals:   01/14/22 1100 01/14/22 1115 01/14/22 1130 01/14/22 1132  BP: (!) 80/46 (!) 59/27 (!) 87/49   Pulse: (!) 107 96 95   Resp: (!) '21 20 16   '$ Temp:    99 F (37.2 C)  TempSrc:    Oral  SpO2: 100% 100% 100%   Weight:      Height:       General exam: awake but eyes closed, no acute distress HEENT: keeps eyes closed, moist mucus membranes, hearing grossly normal  Respiratory system: CTAB, no wheezes, rales or rhonchi, normal respiratory effort. Cardiovascular system: normal S1/S2, RRR, no JVD, murmurs, rubs, gallops, no pedal edema.   Gastrointestinal system: soft, NT, ND Central nervous system: limited exam, pt was non-verbal for me, grossly non-focal Extremities: no edema, normal tone Skin: dry, intact, normal temperature Psychiatry: unable to assess due to patient minimally interactive  Data Reviewed:  Notable labs ---  gap closed on last BMP.  Cr normalized 0.99.  Last beta-hydroxy 2.40.  CBG's recent 157 >> 147 >> 160 >> 143  Family Communication: none present, will attempt to call  Disposition:  Status is: Inpatient Remains inpatient appropriate because: severity of illness.  Continues to have poor PO intake but now hypotensive requiring fluid bolus/es.      Planned Discharge Destination: Home    Time spent: 45 minutes  Author: Ezekiel Slocumb, DO 01/14/2022 12:48 PM  For on call review [email or web address].

## 2022-01-14 NOTE — Assessment & Plan Note (Signed)
Continue wellbutrin

## 2022-01-15 ENCOUNTER — Inpatient Hospital Stay (HOSPITAL_COMMUNITY): Payer: Medicare Other

## 2022-01-15 DIAGNOSIS — I5022 Chronic systolic (congestive) heart failure: Secondary | ICD-10-CM | POA: Diagnosis not present

## 2022-01-15 DIAGNOSIS — E861 Hypovolemia: Secondary | ICD-10-CM | POA: Diagnosis not present

## 2022-01-15 DIAGNOSIS — I9589 Other hypotension: Secondary | ICD-10-CM | POA: Diagnosis not present

## 2022-01-15 DIAGNOSIS — E1010 Type 1 diabetes mellitus with ketoacidosis without coma: Secondary | ICD-10-CM | POA: Diagnosis not present

## 2022-01-15 DIAGNOSIS — R7989 Other specified abnormal findings of blood chemistry: Secondary | ICD-10-CM | POA: Diagnosis not present

## 2022-01-15 LAB — ECHOCARDIOGRAM COMPLETE
AR max vel: 1.3 cm2
AV Area VTI: 1.41 cm2
AV Area mean vel: 1.47 cm2
AV Mean grad: 4 mmHg
AV Peak grad: 6.6 mmHg
Ao pk vel: 1.28 m/s
Area-P 1/2: 3.77 cm2
Calc EF: 52.3 %
Height: 65 in
MV M vel: 3.89 m/s
MV Peak grad: 60.4 mmHg
S' Lateral: 4.25 cm
Single Plane A2C EF: 47.8 %
Single Plane A4C EF: 53.3 %
Weight: 2289.26 oz

## 2022-01-15 LAB — CBC
HCT: 30.3 % — ABNORMAL LOW (ref 36.0–46.0)
Hemoglobin: 10.5 g/dL — ABNORMAL LOW (ref 12.0–15.0)
MCH: 31.2 pg (ref 26.0–34.0)
MCHC: 34.7 g/dL (ref 30.0–36.0)
MCV: 89.9 fL (ref 80.0–100.0)
Platelets: 313 10*3/uL (ref 150–400)
RBC: 3.37 MIL/uL — ABNORMAL LOW (ref 3.87–5.11)
RDW: 13.7 % (ref 11.5–15.5)
WBC: 5.1 10*3/uL (ref 4.0–10.5)
nRBC: 0 % (ref 0.0–0.2)

## 2022-01-15 LAB — BASIC METABOLIC PANEL
Anion gap: 11 (ref 5–15)
BUN: 15 mg/dL (ref 6–20)
CO2: 19 mmol/L — ABNORMAL LOW (ref 22–32)
Calcium: 9.2 mg/dL (ref 8.9–10.3)
Chloride: 109 mmol/L (ref 98–111)
Creatinine, Ser: 0.97 mg/dL (ref 0.44–1.00)
GFR, Estimated: >60 mL/min (ref >60)
Glucose, Bld: 110 mg/dL — ABNORMAL HIGH (ref 70–99)
Potassium: 2.9 mmol/L — ABNORMAL LOW (ref 3.5–5.1)
Sodium: 139 mmol/L (ref 135–145)

## 2022-01-15 LAB — POTASSIUM: Potassium: 3.5 mmol/L (ref 3.5–5.1)

## 2022-01-15 LAB — GLUCOSE, CAPILLARY
Glucose-Capillary: 87 mg/dL (ref 70–99)
Glucose-Capillary: 79 mg/dL (ref 70–99)
Glucose-Capillary: 90 mg/dL (ref 70–99)

## 2022-01-15 LAB — MAGNESIUM: Magnesium: 2.1 mg/dL (ref 1.7–2.4)

## 2022-01-15 MED ORDER — POTASSIUM CHLORIDE 10 MEQ/100ML IV SOLN
10.0000 meq | INTRAVENOUS | Status: AC
  Administered 2022-01-15 (×3): 10 meq via INTRAVENOUS
  Filled 2022-01-15 (×3): qty 100

## 2022-01-15 MED ORDER — POTASSIUM CHLORIDE CRYS ER 20 MEQ PO TBCR: 40.0000 meq | EXTENDED_RELEASE_TABLET | Freq: Every day | ORAL | 0 refills | Status: DC

## 2022-01-15 MED ORDER — METOPROLOL SUCCINATE ER 25 MG PO TB24: 25.0000 mg | ORAL_TABLET | Freq: Every day | ORAL | 1 refills | Status: DC

## 2022-01-15 MED ORDER — LOSARTAN POTASSIUM 50 MG PO TABS
25.0000 mg | ORAL_TABLET | Freq: Every day | ORAL | Status: DC
  Administered 2022-01-15: 25 mg via ORAL
  Filled 2022-01-15: qty 1

## 2022-01-15 MED ORDER — METOPROLOL SUCCINATE ER 25 MG PO TB24
25.0000 mg | ORAL_TABLET | Freq: Every day | ORAL | Status: DC
  Administered 2022-01-15: 25 mg via ORAL
  Filled 2022-01-15: qty 1

## 2022-01-15 MED ORDER — LOSARTAN POTASSIUM 25 MG PO TABS: 25.0000 mg | ORAL_TABLET | Freq: Every day | ORAL | 1 refills | Status: DC

## 2022-01-15 MED ORDER — POTASSIUM CHLORIDE CRYS ER 20 MEQ PO TBCR
40.0000 meq | EXTENDED_RELEASE_TABLET | ORAL | Status: AC
  Administered 2022-01-15 (×2): 40 meq via ORAL
  Filled 2022-01-15 (×2): qty 2

## 2022-01-15 NOTE — Progress Notes (Signed)
  Echocardiogram 2D Echocardiogram has been performed.  Jennifer Hogan 01/15/2022, 10:54 AM

## 2022-01-15 NOTE — Discharge Summary (Signed)
Physician Discharge Summary   Patient: Jennifer Hogan MRN: [id number] DOB: [birth year]  Admit date:     01/13/2022  Discharge date: 01/15/22  Discharge Physician: Ezekiel Slocumb   PCP: [name], NP   Recommendations at discharge:    Follow up with Primary Care in 1 week Repeat BMP, Mg, CBC within 1 week Follow up with Cardiology as scheduled  Discharge Diagnoses: Principal Problem:   DKA, type 1 ([place]) Active Problems:   DKA (diabetic ketoacidosis) ([place])   Hypotension   Elevated troponin   CAD S/P percutaneous coronary angioplasty   AKI (acute kidney injury) ([place])   Chronic systolic heart failure (HCC)   Hyperlipidemia LDL goal <70   Hypothyroidism   GAD (generalized anxiety disorder)   MDD (major depressive disorder), recurrent episode, severe ([place])   Marijuana abuse   Vitreous hemorrhage, right ([place])  Resolved Problems:   Heart failure with reduced ejection fraction [facility])  Hospital Course: HPI on admission: "Jennifer Hogan is a 51 y.o. female with medical history significant of T1DM, naxiety, CAD s/p DES x3, depression, systolic CHF (EF 18-56%), GERD, HLD, hypothyroidism, gastroparesis who presented to ED 01/13/2022 for evaluation of persistent N/V x 2 days. 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  She states a few days ago she started to get "backed up" and when this happens she will have some N/V.  ... She is a type I diabetic and has an insulin pump. She states her sugars were 500 and she got them down to 200 and when she woke up they were 500. She started a higher basal, but at this point it was time to come to ER. "    ER Course:  afebrile, BP 175/117, HR; 130, RR: 22 Pertinent labs: WBC: 17.1, platelets: 513, sodium: 131, Co2: 13 (gap 22), glucose: 419, BUN: 29, creatinine: 1.65, troponin 744>637, beta hydroxybutyric acid:  4.00, VBG normal pH   Started on insulin drip and admitted to stepdown unit for further management of DKA.     12/23: BP's had been elevated, but pt hypotensive this AM.  Getting cautious fluid bolus/es due to low EF.  Intensitivist updated in case of need for pressors for maintain MAP>65.  12/24: BP's stabilized with fluids yesterday.  Sugars adequate back on her insulin pump since yesterday afternoon.  Tolerating diet without N/V today.  Replacing potassium. Clinically improved and stable for discharge.   Assessment and Plan: DKA (diabetic ketoacidosis) ([place]) DKA - POA, resolving. In setting of N/V illness. Pending next BMP, expect able to transition off insulin gtt to pump today. --Appreciate diabetes coordinator's input --Follow serial BMP's --Once DKA resolves, resume insulin pump per protocol --Adjust pump based on finger sticks --Hypoglycemia protocol --Continue close monitoring in stepdown due to hypotension   Elevated troponin Troponin [phone] Cardiology consulted.  Secondary to demand ischemia No heparin gtt needed Downtrending, no chest pain and EKG with no significant changes  Hypotension Initial BP's were quite elevated with isolated episode of hypotension.  Today, 12/23 - pt hypotensive requiring cautious fluid boluses. Did not improve after 250 cc LR Will give another 500 cc bolus Intensivist notified in case of need for pressors. Unclear etiology since she's been fluid resuscitated for DKA. Check D-dimer for completion sake but doubt PE/DVT clinically.  AKI (acute kidney injury) ([place]) Initial Cr 1.65, baseline around 0.9. Prerenal  in setting of N/V and DKA AKI resolved with IV fluids. Monitor BMP.  CAD S/P percutaneous coronary angioplasty Elevated troponin on admission Cardiology consulted. Due to demand ischemia most likely. Continue medical management with ASA+plavix. Allergy to statin. On praluent.  Hold beta blocker with hypotension  Follow up  pending TTE.  Chronic systolic heart failure (HCC) Dry appearing on admission exam. Echo in 04/2021: EF of 30-35% with moderately reduced LVF and global hypokinesis  Strict I/O Watch volume status closely with IVF  Holding cozaar and lasix and metoprolol with AKI, now also hypotensive. Cardiology following - see their recommendations  Hyperlipidemia LDL goal <70 On praluent. Allergy to statin   Hypothyroidism TSH wnl in 04/2021 Continue home synthroid at 148mg/daily   GAD (generalized anxiety disorder) Continue cymbalta   MDD (major depressive disorder), recurrent episode, severe ([place] Continue wellbutrin   Marijuana abuse Check UDS. She is upfront about using this. Unsure if contributed to her nausea/vomiting.   Vitreous hemorrhage, right (HCC) S/p Vitrectomy and endolaser right eye on 12/5 Has 7 more days of steroid drops          Consultants: Cardiology Procedures performed: Echo  Disposition: Home Diet recommendation:  Discharge Diet Orders (From admission, onward)     Start     Ordered   01/15/22 0000  Diet - low sodium heart healthy        01/15/22 1333           Cardiac and Carb modified diet DISCHARGE MEDICATION: Allergies as of 01/15/2022       Reactions   Atorvastatin Other (See Comments)   Myalgias    Crestor [rosuvastatin Calcium] Other (See Comments)   Severe myalgias and joint pain   Monosodium Glutamate Nausea And Vomiting, Other (See Comments)   MSG - migraine   Zetia [ezetimibe] Other (See Comments)   myalgias   Ciprofloxin Hcl [ciprofloxacin] Nausea And Vomiting, Other (See Comments)   Severe migraine   Food Color Red [red Dye] Diarrhea        Medication List     TAKE these medications    acetaminophen 325 MG tablet Commonly known as: TYLENOL Take 650 mg by mouth daily as needed for mild pain, fever or headache.   aspirin EC 81 MG tablet Take 1 tablet (81 mg total) by mouth daily. What changed: when to take this   b  complex vitamins tablet Take 1 tablet by mouth 3 (three) times a week.   Baqsimi One Pack 3 MG/DOSE Powd Generic drug: Glucagon Place 3 mg into the nose once as needed for up to 1 dose.   budesonide 0.25 MG/2ML nebulizer solution Commonly known as: PULMICORT Take 0.25 mg by nebulization daily as needed (shortness of breath/wheezing).   buPROPion 150 MG 24 hr tablet Commonly known as: Wellbutrin XL Take 1 tablet (150 mg total) by mouth every morning.   Citrucel oral powder Generic drug: methylcellulose Take 1 packet by mouth daily.   clopidogrel 75 MG tablet Commonly known as: PLAVIX Take 1 tablet (75 mg total) by mouth daily with breakfast. What changed: when to take this   Dexcom G7 Sensor Misc Apply 1 sensor every 10 days   diclofenac Sodium 1 % Gel Commonly known as: VOLTAREN Apply 2-4 g topically 3 (three) times daily as needed (knee pain).   diphenhydrAMINE 25 MG tablet Commonly known as: BENADRYL Take 37.5 mg by mouth at bedtime.   DULoxetine 20 MG capsule Commonly known as: Cymbalta Take 1 capsule (20 mg  total) by mouth daily. What changed: when to take this   fluticasone 50 MCG/ACT nasal spray Commonly known as: FLONASE insert 1 spray each nostril daily for 3 days then use 2-3 times a wk as needed What changed:  how much to take how to take this when to take this reasons to take this additional instructions   furosemide 20 MG tablet Commonly known as: LASIX Take 1 tablet (20 mg total) by mouth daily as needed (for weight gain).   HumaLOG 100 UNIT/ML injection Generic drug: insulin lispro Inject 0.35 mLs (35 Units total) into the skin daily via pump.   hydrOXYzine 25 MG tablet Commonly known as: ATARAX Take 1 tablet (25 mg total) by mouth 3 (three) times daily as needed for anxiety.   levothyroxine 100 MCG tablet Commonly known as: SYNTHROID Take 100 mcg by mouth daily before breakfast.   losartan 25 MG tablet Commonly known as: COZAAR Take 1  tablet (25 mg total) by mouth daily. Start taking on: January 16, 2022 What changed: how much to take   metoCLOPramide 5 MG tablet Commonly known as: Reglan Take 1 tablet (5 mg total) by mouth every 8 (eight) hours as needed for nausea or vomiting.   metoprolol succinate 25 MG 24 hr tablet Commonly known as: TOPROL-XL Take 1 tablet (25 mg total) by mouth daily. Start taking on: January 16, 2022 What changed: how much to take   montelukast 10 MG tablet Commonly known as: SINGULAIR Take 1 tablet (10 mg total) by mouth at bedtime.   multivitamin with minerals Tabs tablet Take 1 tablet by mouth daily.   nitroGLYCERIN 0.4 MG SL tablet Commonly known as: NITROSTAT Place 1 tablet (0.4 mg total) under the tongue every 5 (five) minutes as needed for chest pain.   omeprazole 20 MG tablet Commonly known as: PRILOSEC OTC Take 20 mg by mouth daily as needed (acid reflux).   OVER THE COUNTER MEDICATION Take 3 tablets by mouth See admin instructions. [employer] Super Lysine Immune Support- Take 3 tablets by mouth once a day on an empty stomach   potassium chloride SA 20 MEQ tablet Commonly known as: KLOR-CON M Take 2 tablets (40 mEq total) by mouth daily for 5 days.   Praluent 150 MG/ML Soaj Generic drug: Alirocumab Inject 1 pen  into the skin every 14 (fourteen) days.   prednisoLONE acetate 1 % ophthalmic suspension Commonly known as: PRED FORTE Place 1 drop into the right eye 4 (four) times daily for 7 days, THEN 1 drop 3 (three) times daily for 7 days, THEN 1 drop 2 (two) times daily for 7 days, THEN 1 drop daily for 7 days. Start taking on: December 28, 2021   Ventolin HFA 108 (90 Base) MCG/ACT inhaler Generic drug: albuterol INHALE 2 PUFFS INTO THE LUNGS EVERY 6 (SIX) HOURS AS NEEDED FOR WHEEZING OR SHORTNESS OF BREATH.        Discharge Exam: Filed Weights   01/13/22 1030 01/13/22 1837  Weight: 68.5 kg 64.9 kg   General exam: awake, alert, no acute  distress HEENT: atraumatic, clear conjunctiva, anicteric sclera, moist mucus membranes, hearing grossly normal  Respiratory system: on room air, normal respiratory effort. Cardiovascular system: RRR, no pedal edema.   Gastrointestinal system: soft, NT, ND, no HSM felt, +bowel sounds. Central nervous system: A&O x3. no gross focal neurologic deficits, normal speech Extremities: moves all, no edema, normal tone Skin: dry, intact, normal temperature Psychiatry: normal mood, congruent affect, judgement and insight appear normal  Condition at discharge: stable  The results of significant diagnostics from this hospitalization (including imaging, microbiology, ancillary and laboratory) are listed below for reference.   Imaging Studies: ECHOCARDIOGRAM COMPLETE  Result Date: 01/15/2022    ECHOCARDIOGRAM REPORT   Patient Name:   Jennifer Hogan Date of Exam: 01/15/2022 Medical Rec #:  [id number]         Height:       65.0 in Accession #:    [id number]        Weight:       143.1 lb Date of Birth:  March 04, 1970        BSA:          1.716 m Patient Age:    51 years          BP:           128/60 mmHg Patient Gender: F                 HR:           81 bpm. Exam Location:  Inpatient Procedure: 2D Echo Indications:    CHF  History:        Patient has prior history of Echocardiogram examinations, most                 recent 05/11/2021. CAD; Risk Factors:Diabetes and Dyslipidemia.  Sonographer:    Harvie Junior Referring Phys: [id number] Gresham Caetano A Renate Danh  Sonographer Comments: Technically difficult study due to poor echo windows. Image acquisition challenging due to respiratory motion. IMPRESSIONS  1. Mid/basal inferior wall hypokinesis EF improved since TTE done 05/11/21. Left ventricular ejection fraction, by estimation, is 45 to 50%. The left ventricle has mildly decreased function. The left ventricle has no regional wall motion abnormalities. The left ventricular internal cavity size was mildly dilated. Left  ventricular diastolic parameters were normal.  2. Right ventricular systolic function is normal. The right ventricular size is normal. There is normal pulmonary artery systolic pressure.  3. The mitral valve is abnormal. Trivial mitral valve regurgitation. No evidence of mitral stenosis.  4. The aortic valve is tricuspid. Aortic valve regurgitation is not visualized. No aortic stenosis is present.  5. The inferior vena cava is normal in size with greater than 50% respiratory variability, suggesting right atrial pressure of 3 mmHg. FINDINGS  Left Ventricle: Mid/basal inferior wall hypokinesis EF improved since TTE done 05/11/21. Left ventricular ejection fraction, by estimation, is 45 to 50%. The left ventricle has mildly decreased function. The left ventricle has no regional wall motion abnormalities. The left ventricular internal cavity size was mildly dilated. There is no left ventricular hypertrophy. Left ventricular diastolic parameters were normal. Right Ventricle: The right ventricular size is normal. No increase in right ventricular wall thickness. Right ventricular systolic function is normal. There is normal pulmonary artery systolic pressure. The tricuspid regurgitant velocity is 2.61 m/s, and  with an assumed right atrial pressure of 3 mmHg, the estimated right ventricular systolic pressure is 10.2 mmHg. Left Atrium: Left atrial size was normal in size. Right Atrium: Right atrial size was normal in size. Pericardium: There is no evidence of pericardial effusion. Mitral Valve: The mitral valve is abnormal. There is mild thickening of the mitral valve leaflet(s). Trivial mitral valve regurgitation. No evidence of mitral valve stenosis. Tricuspid Valve: The tricuspid valve is normal in structure. Tricuspid valve regurgitation is mild . No evidence of tricuspid stenosis. Aortic Valve: The aortic valve is tricuspid. Aortic valve regurgitation is not visualized. No aortic  stenosis is present. Aortic valve mean  gradient measures 4.0 mmHg. Aortic valve peak gradient measures 6.6 mmHg. Aortic valve area, by VTI measures 1.41 cm. Pulmonic Valve: The pulmonic valve was normal in structure. Pulmonic valve regurgitation is not visualized. No evidence of pulmonic stenosis. Aorta: The aortic root is normal in size and structure. Venous: The inferior vena cava is normal in size with greater than 50% respiratory variability, suggesting right atrial pressure of 3 mmHg. IAS/Shunts: No atrial level shunt detected by color flow Doppler.  LEFT VENTRICLE PLAX 2D LVIDd:         4.75 cm     Diastology LVIDs:         4.25 cm     LV e' medial:    6.64 cm/s LV PW:         0.85 cm     LV E/e' medial:  14.5 LV IVS:        0.85 cm     LV e' lateral:   10.90 cm/s LVOT diam:     1.80 cm     LV E/e' lateral: 8.8 LV SV:         35 LV SV Index:   20 LVOT Area:     2.54 cm  LV Volumes (MOD) LV vol d, MOD A2C: 96.1 ml LV vol d, MOD A4C: 96.2 ml LV vol s, MOD A2C: 50.2 ml LV vol s, MOD A4C: 44.9 ml LV SV MOD A2C:     45.9 ml LV SV MOD A4C:     96.2 ml LV SV MOD BP:      54.5 ml RIGHT VENTRICLE TAPSE (M-mode): 1.3 cm LEFT ATRIUM             Index LA diam:        3.20 cm 1.87 cm/m LA Vol (A2C):   32.8 ml 19.12 ml/m LA Vol (A4C):   34.0 ml 19.82 ml/m LA Biplane Vol: 33.6 ml 19.58 ml/m  AORTIC VALVE                    PULMONIC VALVE AV Area (Vmax):    1.30 cm     PV Vmax:       0.93 m/s AV Area (Vmean):   1.47 cm     PV Peak grad:  3.5 mmHg AV Area (VTI):     1.41 cm AV Vmax:           128.00 cm/s AV Vmean:          89.900 cm/s AV VTI:            0.245 m AV Peak Grad:      6.6 mmHg AV Mean Grad:      4.0 mmHg LVOT Vmax:         65.30 cm/s LVOT Vmean:        51.900 cm/s LVOT VTI:          0.136 m LVOT/AV VTI ratio: 0.56  AORTA Ao Root diam: 3.00 cm MITRAL VALVE               TRICUSPID VALVE MV Area (PHT): 3.77 cm    TR Peak grad:   27.2 mmHg MV Decel Time: 201 msec    TR Vmax:        261.00 cm/s MR Peak grad: 60.4 mmHg MR Vmax:      388.50 cm/s   SHUNTS MV E velocity: 96.00 cm/s  Systemic VTI:  0.14 m MV  A velocity: 74.60 cm/s  Systemic Diam: 1.80 cm MV E/A ratio:  1.29 Jenkins Rouge MD Electronically signed by Jenkins Rouge MD Signature Date/Time: 01/15/2022/11:03:14 AM    Final    DG CHEST PORT 1 VIEW  Result Date: 01/14/2022 CLINICAL DATA:  Diabetic ketoacidosis. EXAM: PORTABLE CHEST 1 VIEW COMPARISON:  05/30/2021. FINDINGS: The heart size and mediastinal contours are within normal limits. Both lungs are clear. Sternotomy wires are noted. No acute osseous abnormality. IMPRESSION: No active disease. Electronically Signed   By: Brett Fairy M.D.   On: 01/14/2022 00:32    Microbiology: Results for orders placed or performed during the hospital encounter of 01/13/22  MRSA Next Gen by PCR, Nasal     Status: None   Collection Time: 01/13/22  6:34 PM   Specimen: Nasal Mucosa; Nasal Swab  Result Value Ref Range Status   MRSA by PCR Next Gen NOT DETECTED NOT DETECTED Final    Comment: (NOTE) The GeneXpert MRSA Assay (FDA approved for NASAL specimens only), is one component of a comprehensive MRSA colonization surveillance program. It is not intended to diagnose MRSA infection nor to guide or monitor treatment for MRSA infections. Test performance is not FDA approved in patients less than 66 years old. Performed at [facility], [place] [address] [place], [place] [address]     Labs: CBC: Recent Labs  Lab 01/13/22 1040 01/13/22 1105 01/14/22 0315 01/15/22 0256  WBC 17.1*  --  14.8* 5.1  HGB 13.4 13.6 11.8* 10.5*  HCT 40.4 40.0 35.1* 30.3*  MCV 91.0  --  92.1 89.9  PLT 513*  --  433* 481   Basic Metabolic Panel: Recent Labs  Lab 01/14/22 0409 01/14/22 0939 01/14/22 1243 01/14/22 1905 01/15/22 0256 01/15/22 1234  NA 135 135 137 133* 139  --   K 3.7 3.6 3.5 3.2* 2.9* 3.5  CL 108 106 109 103 109  --   CO2 17* 19* 20* 17* 19*  --   GLUCOSE 162* 182* 150* 359* 110*  --   BUN 28* 21* '20 18 15  '$ --    CREATININE 1.24* 0.99 0.96 1.18* 0.97  --   CALCIUM 9.0 8.8* 8.5* 8.5* 9.2  --   MG  --   --   --   --  2.1  --    Liver Function Tests: Recent Labs  Lab 01/14/22 1249  AST 26  ALT 17  ALKPHOS 72  BILITOT 0.6  PROT 6.9  ALBUMIN 3.5   CBG: Recent Labs  Lab 01/14/22 1957 01/14/22 2332 01/15/22 0332 01/15/22 0808 01/15/22 1203  GLUCAP 366* 184* 87 79 90    Discharge time spent: greater than 30 minutes.  Signed: Ezekiel Slocumb, DO [facility] 01/15/2022

## 2022-01-15 NOTE — Progress Notes (Addendum)
Cardiologist:  Turner/Bensimohn  Subjective:   Much better this am Brighter less nausea no chest pain   Objective:  Vitals:   01/15/22 0500 01/15/22 0600 01/15/22 0700 01/15/22 0814  BP:   128/60   Pulse:   83   Resp: 18 (!) 26 (!) 24   Temp:    98.7 F (37.1 C)  TempSrc:    Oral  SpO2:   97%   Weight:      Height:        Intake/Output from previous day:  Intake/Output Summary (Last 24 hours) at 01/15/2022 0849 Last data filed at 01/14/2022 1600 Gross per 24 hour  Intake 849.99 ml  Output 1 ml  Net 848.99 ml    Physical Exam:  Chronically ill Pale female Lungs clear No murmur  Abdomen benign Post sternotomy  No edema  Lab Results: Basic Metabolic Panel: Recent Labs    01/14/22 1905 01/15/22 0256  NA 133* 139  K 3.2* 2.9*  CL 103 109  CO2 17* 19*  GLUCOSE 359* 110*  BUN 18 15  CREATININE 1.18* 0.97  CALCIUM 8.5* 9.2  MG  --  2.1   Liver Function Tests: Recent Labs    01/14/22 1249  AST 26  ALT 17  ALKPHOS 72  BILITOT 0.6  PROT 6.9  ALBUMIN 3.5   No results for input(s): "LIPASE", "AMYLASE" in the last 72 hours. CBC: Recent Labs    01/14/22 0315 01/15/22 0256  WBC 14.8* 5.1  HGB 11.8* 10.5*  HCT 35.1* 30.3*  MCV 92.1 89.9  PLT 433* 313   Cardiac Enzymes: No results for input(s): "CKTOTAL", "CKMB", "CKMBINDEX", "TROPONINI" in the last 72 hours. BNP: Invalid input(s): "POCBNP" D-Dimer: Recent Labs    01/14/22 1241  DDIMER 0.88*     Imaging: DG CHEST PORT 1 VIEW  Result Date: 01/14/2022 CLINICAL DATA:  Diabetic ketoacidosis. EXAM: PORTABLE CHEST 1 VIEW COMPARISON:  05/30/2021. FINDINGS: The heart size and mediastinal contours are within normal limits. Both lungs are clear. Sternotomy wires are noted. No acute osseous abnormality. IMPRESSION: No active disease. Electronically Signed   By: Brett Fairy M.D.   On: 01/14/2022 00:32    Cardiac Studies:  ECG: ST rate 127 poor Rwave progression no acute ST changes     Telemetry:  NSR 01/15/2022   Echo: EF 30-35% 05/11/21   Medications:    aspirin EC  81 mg Oral Daily   buPROPion  150 mg Oral Daily   Chlorhexidine Gluconate Cloth  6 each Topical Q2200   clopidogrel  75 mg Oral QHS   diphenhydrAMINE  37.5 mg Oral QHS   DULoxetine  20 mg Oral QHS   enoxaparin (LOVENOX) injection  40 mg Subcutaneous Q24H   fluticasone  1 spray Each Nare Daily   insulin pump   Subcutaneous Q4H   levothyroxine  100 mcg Oral QAC breakfast   loratadine  10 mg Oral Daily   montelukast  10 mg Oral QHS   potassium chloride  40 mEq Oral Q4H   prednisoLONE acetate  1 drop Right Eye Daily   psyllium  1 packet Oral Daily      dextrose 5% lactated ringers Stopped (01/14/22 1526)   insulin Stopped (01/14/22 1526)   lactated ringers Stopped (01/13/22 1707)   potassium chloride      Assessment/Plan:  Elevated troponin:  no chest pain, no acute ECG changes Frequent admission with DKA and elevated troponin. Cath 05/11/21 in similar setting with no native RCA dx  and patent RIMA to OM patent LIMA to IM/LAD Continue ASA/Plavix Increase Toprol to 25 mg daily No plans for repeat cath or ischemic w/u  DKA:  per primary service insulin drip transition back to her insulin pump Thyroid:  continue synthroid replacement    Gastroparesis:  continue Zofran and reglan follow QT interval on ECG  Ischemic DCM:  TTE pending EF 30-35% not volume overloaded has failed Rx with aldactone with hyperkalemia No SGlT2 with DMI, Resume losartan And Toprol Primary service to supplement K   Jenkins Rouge 01/15/2022, 8:49 AM

## 2022-01-15 NOTE — Progress Notes (Signed)
  Transition of Care (TOC) Screening Note   Patient Details  Name: Jennifer Hogan Date of Birth: 20-Aug-1970   Transition [facility] CM/SW Contact:    Henrietta Dine, RN Phone Number: 01/15/2022, 2:30 PM    Transition of Care Department [facility]) has reviewed patient and no TOC needs have been identified at this time. We will continue to monitor patient advancement through interdisciplinary progression rounds. If new patient transition needs arise, please place a TOC consult.

## 2022-01-17 ENCOUNTER — Other Ambulatory Visit: Payer: Self-pay

## 2022-01-17 ENCOUNTER — Telehealth: Payer: Self-pay | Admitting: *Deleted

## 2022-01-17 NOTE — Telephone Encounter (Signed)
Transition Care Management Unsuccessful Follow-up Telephone Call  Date of discharge and from where:  01/15/2022 [facility]  Attempts:  1st Attempt  Reason for unsuccessful TCM follow-up call:  Left voice message

## 2022-01-19 ENCOUNTER — Ambulatory Visit: Payer: Medicaid Other

## 2022-01-19 ENCOUNTER — Other Ambulatory Visit: Payer: Self-pay

## 2022-01-23 ENCOUNTER — Other Ambulatory Visit: Payer: Self-pay

## 2022-01-23 ENCOUNTER — Telehealth: Payer: Medicaid Other | Admitting: Physician Assistant

## 2022-01-23 DIAGNOSIS — J019 Acute sinusitis, unspecified: Secondary | ICD-10-CM

## 2022-01-23 DIAGNOSIS — B9689 Other specified bacterial agents as the cause of diseases classified elsewhere: Secondary | ICD-10-CM | POA: Diagnosis not present

## 2022-01-23 MED ORDER — AMOXICILLIN-POT CLAVULANATE 875-125 MG PO TABS
1.0000 | ORAL_TABLET | Freq: Two times a day (BID) | ORAL | 0 refills | Status: DC
  Filled 2022-01-23 – 2022-01-24 (×2): qty 14, 7d supply, fill #0

## 2022-01-23 NOTE — Progress Notes (Signed)
E-Visit for Sinus Problems  We are sorry that you are not feeling well.  Here is how we plan to help!  Based on what you have shared with me it looks like you have sinusitis.  Sinusitis is inflammation and infection in the sinus cavities of the head.  Based on your presentation I believe you most likely have Acute Bacterial Sinusitis.  This is an infection caused by bacteria and is treated with antibiotics. I have prescribed Augmentin 875mg/125mg one tablet twice daily with food, for 7 days. You may use an oral decongestant such as Mucinex D or if you have glaucoma or high blood pressure use plain Mucinex. Saline nasal spray help and can safely be used as often as needed for congestion.  If you develop worsening sinus pain, fever or notice severe headache and vision changes, or if symptoms are not better after completion of antibiotic, please schedule an appointment with a health care provider.    Sinus infections are not as easily transmitted as other respiratory infection, however we still recommend that you avoid close contact with loved ones, especially the very young and elderly.  Remember to wash your hands thoroughly throughout the day as this is the number one way to prevent the spread of infection!  Home Care: Only take medications as instructed by your medical team. Complete the entire course of an antibiotic. Do not take these medications with alcohol. A steam or ultrasonic humidifier can help congestion.  You can place a towel over your head and breathe in the steam from hot water coming from a faucet. Avoid close contacts especially the very young and the elderly. Cover your mouth when you cough or sneeze. Always remember to wash your hands.  Get Help Right Away If: You develop worsening fever or sinus pain. You develop a severe head ache or visual changes. Your symptoms persist after you have completed your treatment plan.  Make sure you Understand these instructions. Will watch  your condition. Will get help right away if you are not doing well or get worse.  Thank you for choosing an e-visit.  Your e-visit answers were reviewed by a board certified advanced clinical practitioner to complete your personal care plan. Depending upon the condition, your plan could have included both over the counter or prescription medications.  Please review your pharmacy choice. Make sure the pharmacy is open so you can pick up prescription now. If there is a problem, you may contact your provider through MyChart messaging and have the prescription routed to another pharmacy.  Your safety is important to us. If you have drug allergies check your prescription carefully.   For the next 24 hours you can use MyChart to ask questions about today's visit, request a non-urgent call back, or ask for a work or school excuse. You will get an email in the next two days asking about your experience. I hope that your e-visit has been valuable and will speed your recovery.  

## 2022-01-23 NOTE — Progress Notes (Signed)
I have spent 5 minutes in review of e-visit questionnaire, review and updating patient chart, medical decision making and response to patient.   Desmund Elman Cody Cathye Kreiter, PA-C    

## 2022-01-24 ENCOUNTER — Other Ambulatory Visit: Payer: Self-pay

## 2022-01-26 ENCOUNTER — Other Ambulatory Visit: Payer: Self-pay

## 2022-01-27 ENCOUNTER — Other Ambulatory Visit: Payer: Self-pay

## 2022-01-28 ENCOUNTER — Other Ambulatory Visit: Payer: Self-pay | Admitting: Nurse Practitioner

## 2022-01-30 ENCOUNTER — Other Ambulatory Visit: Payer: Self-pay | Admitting: Nurse Practitioner

## 2022-01-30 ENCOUNTER — Other Ambulatory Visit: Payer: Self-pay

## 2022-01-30 ENCOUNTER — Encounter: Payer: Self-pay | Admitting: Pharmacist

## 2022-01-30 MED ORDER — LEVOTHYROXINE SODIUM 100 MCG PO TABS
100.0000 ug | ORAL_TABLET | Freq: Every day | ORAL | 2 refills | Status: DC
  Filled 2022-01-30: qty 30, 30d supply, fill #0
  Filled 2022-03-03: qty 30, 30d supply, fill #1
  Filled 2022-04-07: qty 30, 30d supply, fill #2

## 2022-02-01 ENCOUNTER — Other Ambulatory Visit: Payer: Self-pay

## 2022-02-02 ENCOUNTER — Other Ambulatory Visit: Payer: Self-pay

## 2022-02-06 ENCOUNTER — Other Ambulatory Visit: Payer: Self-pay

## 2022-02-08 ENCOUNTER — Encounter: Payer: Self-pay | Admitting: Internal Medicine

## 2022-02-09 ENCOUNTER — Encounter: Payer: Self-pay | Admitting: Internal Medicine

## 2022-02-14 ENCOUNTER — Inpatient Hospital Stay: Payer: Medicaid Other | Admitting: Nurse Practitioner

## 2022-02-15 ENCOUNTER — Ambulatory Visit: Payer: Medicaid Other | Attending: Nurse Practitioner | Admitting: Nurse Practitioner

## 2022-02-15 ENCOUNTER — Other Ambulatory Visit: Payer: Self-pay

## 2022-02-15 ENCOUNTER — Encounter: Payer: Self-pay | Admitting: Nurse Practitioner

## 2022-02-15 VITALS — BP 96/56 | HR 97 | Ht 65.0 in | Wt 144.8 lb

## 2022-02-15 DIAGNOSIS — T466X5A Adverse effect of antihyperlipidemic and antiarteriosclerotic drugs, initial encounter: Secondary | ICD-10-CM

## 2022-02-15 DIAGNOSIS — I5022 Chronic systolic (congestive) heart failure: Secondary | ICD-10-CM

## 2022-02-15 DIAGNOSIS — J9801 Acute bronchospasm: Secondary | ICD-10-CM | POA: Diagnosis not present

## 2022-02-15 DIAGNOSIS — G720 Drug-induced myopathy: Secondary | ICD-10-CM | POA: Diagnosis not present

## 2022-02-15 DIAGNOSIS — D649 Anemia, unspecified: Secondary | ICD-10-CM | POA: Diagnosis not present

## 2022-02-15 DIAGNOSIS — E1059 Type 1 diabetes mellitus with other circulatory complications: Secondary | ICD-10-CM | POA: Diagnosis not present

## 2022-02-15 DIAGNOSIS — H4311 Vitreous hemorrhage, right eye: Secondary | ICD-10-CM

## 2022-02-15 DIAGNOSIS — Z23 Encounter for immunization: Secondary | ICD-10-CM

## 2022-02-15 DIAGNOSIS — I25119 Atherosclerotic heart disease of native coronary artery with unspecified angina pectoris: Secondary | ICD-10-CM

## 2022-02-15 DIAGNOSIS — Z09 Encounter for follow-up examination after completed treatment for conditions other than malignant neoplasm: Secondary | ICD-10-CM

## 2022-02-15 MED ORDER — MONTELUKAST SODIUM 10 MG PO TABS
10.0000 mg | ORAL_TABLET | Freq: Every day | ORAL | 2 refills | Status: DC
  Filled 2022-02-15 – 2022-05-15 (×3): qty 90, 90d supply, fill #0
  Filled 2022-08-23: qty 90, 90d supply, fill #1

## 2022-02-15 MED ORDER — MOMETASONE FURO-FORMOTEROL FUM 100-5 MCG/ACT IN AERO
2.0000 | INHALATION_SPRAY | Freq: Two times a day (BID) | RESPIRATORY_TRACT | 6 refills | Status: DC
  Filled 2022-02-15: qty 13, 30d supply, fill #0
  Filled 2022-04-07: qty 13, 30d supply, fill #1
  Filled 2022-06-12: qty 13, 30d supply, fill #2
  Filled 2022-07-10: qty 13, 30d supply, fill #3
  Filled 2022-09-12: qty 13, 30d supply, fill #4

## 2022-02-15 NOTE — Progress Notes (Signed)
Assessment & Plan:  Jennifer Hogan was seen today for hospitalization follow-up.  Diagnoses and all orders for this visit:  Hospital discharge follow-up -     CMP14+EGFR  Bronchospasm -     montelukast (SINGULAIR) 10 MG tablet; Take 1 tablet (10 mg total) by mouth at bedtime. -     mometasone-formoterol (DULERA) 100-5 MCG/ACT AERO; Inhale 2 puffs into the lungs 2 (two) times daily. -     Ambulatory referral to Pulmonology  Need for shingles vaccine -     Cancel: Varicella-zoster vaccine IM  Need for diphtheria-tetanus-pertussis (Tdap) vaccine -     Cancel: Tdap vaccine greater than or equal to 7yo IM  Anemia, unspecified type -     CBC with Differential    Patient has been counseled on age-appropriate routine health concerns for screening and prevention. These are reviewed and up-to-date. Referrals have been placed accordingly. Immunizations are up-to-date or declined.    Subjective:   Chief Complaint  Patient presents with   Hospitalization Follow-up   HPI Jennifer Hogan 52 y.o. female presents to office today for HFU   She has a history of T1DM, anxiety, CAD s/p DES x3, depression, systolic CHF (EF 77-82%), GERD, HLD, hypothyroidism, gastroparesis   HFU Admitted from 01-13-2022 through 01-15-2022 with DKA requiring insulin gtt, IVF bolus for hypotension and AKI,  and electrolyte replacement for hypokalemia.  She was discharged home in stable condition and since then has been taking half a tablet of losartan 25 mg due to low blood pressure readings.   DM Notes blood glucose levels over the past 2 days >200. Glucose level today is 145.   PULM She notes gradual onset of increased episodes of difficult breathing with minimal exertion. Activities such as walking to the mailbox will elicit shortness of breath. She is using nebulizers at night as they help her sleep better. Uses albuterol twice day. Last ECHO 12-2021 (EF 45-50%.) She was a light smoker in the past over 30 years  ago.     Review of Systems  Constitutional:  Negative for fever, malaise/fatigue and weight loss.  HENT: Negative.  Negative for nosebleeds.   Eyes: Negative.  Negative for blurred vision, double vision and photophobia.  Respiratory:  Positive for shortness of breath. Negative for cough, hemoptysis, sputum production and wheezing.   Cardiovascular: Negative.  Negative for chest pain, palpitations and leg swelling.  Gastrointestinal: Negative.  Negative for heartburn, nausea and vomiting.  Musculoskeletal: Negative.  Negative for myalgias.  Neurological: Negative.  Negative for dizziness, focal weakness, seizures and headaches.  Psychiatric/Behavioral:  Positive for depression. Negative for suicidal ideas. The patient is nervous/anxious.     Past Medical History:  Diagnosis Date   Allergy    Anemia    Anxiety    Asthma    Cataract    CHF (congestive heart failure) (HCC)    Coronary artery disease    a. s/p PTCA DES x3 in the LAD (ostial DES placed, mid DES placed, distal DES placed) and PTCA/DES x1 to intermediate branch 02/2017. b. ACS 02/08/18 with LCx stent placement.   Depression    Diabetes mellitus    type 1   DKA (diabetic ketoacidoses) 12/31/2017   Dyspnea    with exertion and dust, no oxygen   Elevated platelet count    GERD (gastroesophageal reflux disease)    Heart murmur    never caused any problems   High cholesterol    Hypothyroidism    Ischemic cardiomyopathy  a. EF low-normal with basal inferior akinesis, basal septal hypokinesis by echo 01/2018.   Myocardial infarction ([place]) 2019   Nausea and vomiting 12/04/2010   Substance abuse ([place])    marijuana    Past Surgical History:  Procedure Laterality Date   25 GAUGE PARS PLANA VITRECTOMY WITH 20 GAUGE MVR PORT Right 11/24/2021   Procedure: 25 GAUGE PARS PLANA VITRECTOMY APPROACH RIGHT EYE, FLUID FLUID EXCHANGE;  Surgeon: Jalene Mullet, MD;  Location: [place];  Service: Ophthalmology;  Laterality: Right;    AIR/FLUID EXCHANGE Right 12/27/2021   Procedure: AIR/FLUID EXCHANGE;  Surgeon: Jalene Mullet, MD;  Location: [place];  Service: Ophthalmology;  Laterality: Right;   APPENDECTOMY     BIOPSY  09/29/2021   Procedure: BIOPSY;  Surgeon: Yetta Flock, MD;  Location: [facility];  Service: Gastroenterology;;   CATARACT EXTRACTION W/PHACO Right 11/24/2021   Procedure: CATARACT EXTRACTION AND INTRAOCULAR LENS PLACEMENT ([place]) RIGHT EYE;  Surgeon: Jalene Mullet, MD;  Location: [place];  Service: Ophthalmology;  Laterality: Right;   COLONOSCOPY     COLONOSCOPY WITH PROPOFOL N/A 09/29/2021   Procedure: COLONOSCOPY WITH PROPOFOL;  Surgeon: Yetta Flock, MD;  Location: [facility];  Service: Gastroenterology;  Laterality: N/A;   CORONARY ARTERY BYPASS GRAFT N/A 11/04/2018   Procedure: CORONARY ARTERY BYPASS GRAFTING (CABG), ON PUMP, TIMES THREE, USING LEFT AND RIGHT INTERNAL MAMMARY ARTERIES;  Surgeon: Wonda Olds, MD;  Location: [place];  Service: Open Heart Surgery;  Laterality: N/A;   CORONARY STENT INTERVENTION N/A 03/20/2017   Procedure: DES x 3 LAD, DES OM; Surgeon: Burnell Blanks, MD;  Location: [facility];  Service: Cardiovascular;  Laterality: N/A;   CORONARY/GRAFT ACUTE MI REVASCULARIZATION N/A 02/08/2018   Procedure: Coronary/Graft Acute MI Revascularization;  Surgeon: Belva Crome, MD;  Location: [place] [facility];  Service: Cardiovascular;  Laterality: N/A;   ESOPHAGOGASTRODUODENOSCOPY (EGD) WITH PROPOFOL N/A 09/29/2021   Procedure: ESOPHAGOGASTRODUODENOSCOPY (EGD) WITH PROPOFOL;  Surgeon: Yetta Flock, MD;  [facility];  Service: Gastroenterology;  Laterality: N/A;   GAS INSERTION Right 04/28/2021   Procedure: INSERTION OF GAS ( C3F8);  Surgeon: Jalene Mullet, MD;  Location: [place];  Service: Ophthalmology;  Laterality: Right;  Right eye   GAS/FLUID EXCHANGE Right 04/28/2021   Procedure: GAS/FLUID EXCHANGE;  Surgeon: Jalene Mullet, MD;   Location: [place];  Service: Ophthalmology;  Laterality: Right;  Right eye   LASER PHOTO ABLATION Right 11/24/2021   Procedure: ENDO LASER PAN PHOTOCOAGULATION;  Surgeon: Jalene Mullet, MD;  Location: [place];  Service: Ophthalmology;  Laterality: Right;   LASER PHOTO ABLATION Right 12/27/2021   Procedure: ENDO LASER;  Surgeon: Jalene Mullet, MD;  Location: [place];  Service: Ophthalmology;  Laterality: Right;   LEFT HEART CATH AND CORONARY ANGIOGRAPHY N/A 03/20/2017   Procedure: LEFT HEART CATH AND CORONARY ANGIOGRAPHY;  Surgeon: Burnell Blanks, MD;  Location: [place] [facility];  Service: Cardiovascular;  Laterality: N/A;   LEFT HEART CATH AND CORONARY ANGIOGRAPHY N/A 02/08/2018   Procedure: LEFT HEART CATH AND CORONARY ANGIOGRAPHY;  Surgeon: Belva Crome, MD;  Location: [facility];  Service: Cardiovascular;  Laterality: N/A;   LEFT HEART CATH AND CORONARY ANGIOGRAPHY N/A 10/29/2018   Procedure: LEFT HEART CATH AND CORONARY ANGIOGRAPHY;  Surgeon: Burnell Blanks, MD;  Location: [place] [facility];  Service: Cardiovascular;  Laterality: N/A;   LEFT HEART CATH AND CORS/GRAFTS ANGIOGRAPHY N/A 10/04/2020   Procedure: LEFT HEART CATH AND CORS/GRAFTS ANGIOGRAPHY;  Surgeon: Sherren Mocha, MD;  Location: [place] [facility];  Service: Cardiovascular;  Laterality: N/A;   LEFT HEART CATH AND CORS/GRAFTS ANGIOGRAPHY N/A 05/11/2021   Procedure: LEFT HEART CATH AND CORS/GRAFTS ANGIOGRAPHY;  Surgeon: Martinique, Peter M, MD;  Location: [facility] [facility];  Service: Cardiovascular;  Laterality: N/A;   PARS PLANA VITRECTOMY Right 04/28/2021   Procedure: PARS PLANA VITRECTOMY WITH 25 GAUGE REMOVAL OF TRATIONAL MEMBRANE RIGHT EYE; DRAINAGE OF SUBRETINAL FLUID RIGHT EYE;  Surgeon: Jalene Mullet, MD;  Location: [place];  Service: Ophthalmology;  Laterality: Right;  Right eye   PARS PLANA VITRECTOMY 27 GAUGE Right 12/27/2021   Procedure: PARS PLANA VITRECTOMY 27 GAUGE;  Surgeon: Jalene Mullet,  MD;  Location: [place];  Service: Ophthalmology;  Laterality: Right;   PHOTOCOAGULATION WITH LASER Right 04/28/2021   Procedure: PHOTOCOAGULATION WITH LASER;  Surgeon: Jalene Mullet, MD;  Location: [place];  Service: Ophthalmology;  Laterality: Right;  Right eye   POLYPECTOMY  09/29/2021   Procedure: POLYPECTOMY;  Surgeon: Yetta Flock, MD;  Location[facility];  Service: Gastroenterology;;   TEE WITHOUT CARDIOVERSION N/A 11/04/2018   Procedure: TRANSESOPHAGEAL ECHOCARDIOGRAM (TEE);  Surgeon: Wonda Olds, MD;  Location: [place];  Service: Open Heart Surgery;  Laterality: N/A;    Family History  Problem Relation Age of Onset   Cancer Mother        T cell lymphoma   Hypertension Mother    Hypercalcemia Mother    OCD Father    Anxiety disorder Father    Coronary artery disease Father    Congestive Heart Failure Father    Asthma Father    Hypercalcemia Father    Hypertension Father    Colon polyps Father    Depression Maternal Grandmother    Diabetes Paternal Grandmother    Stomach cancer Neg Hx    Esophageal cancer Neg Hx    Colon cancer Neg Hx    Rectal cancer Neg Hx     Social History Reviewed with no changes to be made today.   Outpatient Medications Prior to Visit  Medication Sig Dispense Refill   acetaminophen (TYLENOL) 325 MG tablet Take 650 mg by mouth daily as needed for mild pain, fever or headache.     albuterol (VENTOLIN HFA) 108 (90 Base) MCG/ACT inhaler INHALE 2 PUFFS INTO THE LUNGS EVERY 6 (SIX) HOURS AS NEEDED FOR WHEEZING OR SHORTNESS OF BREATH. 18 g 2   Alirocumab (PRALUENT) 150 MG/ML SOAJ Inject 1 pen  into the skin every 14 (fourteen) days. 2 mL 11   amoxicillin-clavulanate (AUGMENTIN) 875-125 MG tablet Take 1 tablet by mouth 2 (two) times daily. 14 tablet 0   aspirin 81 MG EC tablet Take 1 tablet (81 mg total) by mouth daily. (Patient taking differently: Take 81 mg by mouth at bedtime.) 90 tablet 0   b complex vitamins tablet Take 1 tablet by mouth  3 (three) times a week.     budesonide (PULMICORT) 0.25 MG/2ML nebulizer solution Take 0.25 mg by nebulization daily as needed (shortness of breath/wheezing).     buPROPion (WELLBUTRIN XL) 150 MG 24 hr tablet Take 1 tablet (150 mg total) by mouth every morning. 30 tablet 2   clopidogrel (PLAVIX) 75 MG tablet Take 1 tablet (75 mg total) by mouth daily with breakfast. (Patient taking differently: Take 75 mg by mouth at bedtime.) 90 tablet 2   Continuous Blood Gluc Sensor (DEXCOM G7 SENSOR) MISC Apply 1 sensor every 10 days 9 each 4  diclofenac Sodium (VOLTAREN) 1 % GEL Apply 2-4 g topically 3 (three) times daily as needed (knee pain).     diphenhydrAMINE (BENADRYL) 25 MG tablet Take 37.5 mg by mouth at bedtime.     DULoxetine (CYMBALTA) 20 MG capsule Take 1 capsule (20 mg total) by mouth daily. (Patient taking differently: Take 20 mg by mouth at bedtime.) 30 capsule 2   fluticasone (FLONASE) 50 MCG/ACT nasal spray Insert 1 spray each nostril daily for 3 days then use 2-3 times a wk as needed 16 g 0   furosemide (LASIX) 20 MG tablet Take 1 tablet (20 mg total) by mouth daily as needed (for weight gain). 15 tablet 3   Glucagon 3 MG/DOSE POWD Place 3 mg into the nose once as needed for up to 1 dose. 1 each 11   hydrOXYzine (ATARAX) 25 MG tablet Take 1 tablet (25 mg total) by mouth 3 (three) times daily as needed for anxiety. 90 tablet 2   insulin lispro (HUMALOG) 100 UNIT/ML injection Inject 0.35 mLs (35 Units total) into the skin daily via pump. 10 mL 1   levothyroxine (SYNTHROID) 100 MCG tablet Take 1 tablet (100 mcg total) by mouth daily before breakfast. 30 tablet 2   losartan (COZAAR) 25 MG tablet Take 1 tablet (25 mg total) by mouth daily. 30 tablet 1   methylcellulose (CITRUCEL) oral powder Take 1 packet by mouth daily.     metoCLOPramide (REGLAN) 5 MG tablet Take 1 tablet (5 mg total) by mouth every 8 (eight) hours as needed for nausea or vomiting. 60 tablet 3   metoprolol succinate (TOPROL-XL)  25 MG 24 hr tablet Take 1 tablet (25 mg total) by mouth daily. 30 tablet 1   Multiple Vitamin (MULTIVITAMIN WITH MINERALS) TABS tablet Take 1 tablet by mouth daily.     nitroGLYCERIN (NITROSTAT) 0.4 MG SL tablet Place 1 tablet (0.4 mg total) under the tongue every 5 (five) minutes as needed for chest pain. 25 tablet 6   omeprazole (PRILOSEC OTC) 20 MG tablet Take 20 mg by mouth daily as needed (acid reflux).     OVER THE COUNTER MEDICATION Take 3 tablets by mouth See admin instructions. [employer] Super Lysine Immune Support- Take 3 tablets by mouth once a day on an empty stomach     potassium chloride SA (KLOR-CON M) 20 MEQ tablet Take 2 tablets (40 mEq total) by mouth daily for 5 days. 10 tablet 0   prednisoLONE acetate (PRED FORTE) 1 % ophthalmic suspension Place 1 drop into the right eye 4 (four) times daily for 7 days, THEN 1 drop 3 (three) times daily for 7 days, THEN 1 drop 2 (two) times daily for 7 days, THEN 1 drop daily for 7 days. 10 mL 0   montelukast (SINGULAIR) 10 MG tablet Take 1 tablet (10 mg total) by mouth at bedtime. 90 tablet 2   No facility-administered medications prior to visit.    Allergies  Allergen Reactions   Atorvastatin Other (See Comments)    Myalgias    Crestor [Rosuvastatin Calcium] Other (See Comments)    Severe myalgias and joint pain   Monosodium Glutamate Nausea And Vomiting and Other (See Comments)    MSG - migraine   Zetia [Ezetimibe] Other (See Comments)    myalgias   Ciprofloxin Hcl [Ciprofloxacin] Nausea And Vomiting and Other (See Comments)    Severe migraine   Food Color Red [Red Dye] Diarrhea       Objective:    BP Marland Kitchen)  96/56   Pulse 97   Ht '5\' 5"'$  (1.651 m)   Wt 144 lb 12.8 oz (65.7 kg)   LMP 12/23/2021 (Approximate)   SpO2 100%   BMI 24.10 kg/m  Wt Readings from Last 3 Encounters:  02/15/22 144 lb 12.8 oz (65.7 kg)  01/13/22 143 lb 1.3 oz (64.9 kg)  01/09/22 149 lb (67.6 kg)    Physical Exam Vitals and nursing note reviewed.   Constitutional:      Appearance: She is well-developed.  HENT:     Head: Normocephalic and atraumatic.  Cardiovascular:     Rate and Rhythm: Normal rate and regular rhythm.     Heart sounds: Normal heart sounds. No murmur heard.    No friction rub. No gallop.  Pulmonary:     Effort: Pulmonary effort is normal. No tachypnea or respiratory distress.     Breath sounds: Normal breath sounds. No decreased breath sounds, wheezing, rhonchi or rales.  Chest:     Chest wall: No tenderness.  Abdominal:     General: Bowel sounds are normal.     Palpations: Abdomen is soft.  Musculoskeletal:        General: Normal range of motion.     Cervical back: Normal range of motion.  Skin:    General: Skin is warm and dry.  Neurological:     Mental Status: She is alert and oriented to person, place, and time.     Coordination: Coordination normal.  Psychiatric:        Behavior: Behavior normal. Behavior is cooperative.        Thought Content: Thought content normal.        Judgment: Judgment normal.          Patient has been counseled extensively about nutrition and exercise as well as the importance of adherence with medications and regular follow-up. The patient was given clear instructions to go to ER or return to medical center if symptoms don't improve, worsen or new problems develop. The patient verbalized understanding.   Follow-up: Return in about 3 months (around 05/17/2022).   [name], FNP-BC [facility] and [facility], [place]   02/19/2022, 8:42 PM

## 2022-02-15 NOTE — Progress Notes (Signed)
No concerns or questions.

## 2022-02-16 LAB — CBC WITH DIFFERENTIAL/PLATELET
Basophils Absolute: 0 10*3/uL (ref 0.0–0.2)
Basos: 1 %
EOS (ABSOLUTE): 0.6 10*3/uL — ABNORMAL HIGH (ref 0.0–0.4)
Eos: 9 %
Hematocrit: 38.5 % (ref 34.0–46.6)
Hemoglobin: 12.6 g/dL (ref 11.1–15.9)
Immature Grans (Abs): 0 10*3/uL (ref 0.0–0.1)
Immature Granulocytes: 0 %
Lymphocytes Absolute: 2.4 10*3/uL (ref 0.7–3.1)
Lymphs: 38 %
MCH: 29.7 pg (ref 26.6–33.0)
MCHC: 32.7 g/dL (ref 31.5–35.7)
MCV: 91 fL (ref 79–97)
Monocytes Absolute: 0.4 10*3/uL (ref 0.1–0.9)
Monocytes: 6 %
Neutrophils Absolute: 2.9 10*3/uL (ref 1.4–7.0)
Neutrophils: 46 %
Platelets: 436 10*3/uL (ref 150–450)
RBC: 4.24 x10E6/uL (ref 3.77–5.28)
RDW: 14.2 % (ref 11.7–15.4)
WBC: 6.3 10*3/uL (ref 3.4–10.8)

## 2022-02-16 LAB — CMP14+EGFR
ALT: 22 IU/L (ref 0–32)
AST: 27 IU/L (ref 0–40)
Albumin/Globulin Ratio: 1.6 (ref 1.2–2.2)
Albumin: 4.5 g/dL (ref 3.8–4.9)
Alkaline Phosphatase: 115 IU/L (ref 44–121)
BUN/Creatinine Ratio: 15 (ref 9–23)
BUN: 15 mg/dL (ref 6–24)
Bilirubin Total: 0.2 mg/dL (ref 0.0–1.2)
CO2: 20 mmol/L (ref 20–29)
Calcium: 9.4 mg/dL (ref 8.7–10.2)
Chloride: 100 mmol/L (ref 96–106)
Creatinine, Ser: 1 mg/dL (ref 0.57–1.00)
Globulin, Total: 2.9 g/dL (ref 1.5–4.5)
Glucose: 140 mg/dL — ABNORMAL HIGH (ref 70–99)
Potassium: 4.6 mmol/L (ref 3.5–5.2)
Sodium: 138 mmol/L (ref 134–144)
Total Protein: 7.4 g/dL (ref 6.0–8.5)
eGFR: 67 mL/min/{1.73_m2} (ref >59)

## 2022-02-19 ENCOUNTER — Encounter: Payer: Self-pay | Admitting: Nurse Practitioner

## 2022-02-19 DIAGNOSIS — I25119 Atherosclerotic heart disease of native coronary artery with unspecified angina pectoris: Secondary | ICD-10-CM | POA: Insufficient documentation

## 2022-02-19 DIAGNOSIS — I2510 Atherosclerotic heart disease of native coronary artery without angina pectoris: Secondary | ICD-10-CM | POA: Insufficient documentation

## 2022-03-03 ENCOUNTER — Other Ambulatory Visit: Payer: Self-pay

## 2022-03-06 ENCOUNTER — Other Ambulatory Visit: Payer: Self-pay

## 2022-03-06 ENCOUNTER — Telehealth (INDEPENDENT_AMBULATORY_CARE_PROVIDER_SITE_OTHER): Payer: Medicaid Other | Admitting: Psychiatry

## 2022-03-06 ENCOUNTER — Encounter: Payer: Self-pay | Admitting: Internal Medicine

## 2022-03-06 ENCOUNTER — Encounter (HOSPITAL_BASED_OUTPATIENT_CLINIC_OR_DEPARTMENT_OTHER): Payer: Self-pay | Admitting: Cardiology

## 2022-03-06 DIAGNOSIS — F331 Major depressive disorder, recurrent, moderate: Secondary | ICD-10-CM

## 2022-03-06 DIAGNOSIS — F411 Generalized anxiety disorder: Secondary | ICD-10-CM

## 2022-03-06 MED ORDER — DULOXETINE HCL 30 MG PO CPEP
30.0000 mg | ORAL_CAPSULE | Freq: Every day | ORAL | 2 refills | Status: DC
  Filled 2022-03-06: qty 30, 30d supply, fill #0
  Filled 2022-04-07: qty 30, 30d supply, fill #1
  Filled 2022-05-15: qty 30, 30d supply, fill #2

## 2022-03-06 MED ORDER — BUPROPION HCL ER (XL) 150 MG PO TB24
150.0000 mg | ORAL_TABLET | ORAL | 2 refills | Status: DC
  Filled 2022-03-06: qty 30, 30d supply, fill #0
  Filled 2022-04-07: qty 30, 30d supply, fill #1
  Filled 2022-05-15: qty 30, 30d supply, fill #2
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
## 2022-03-08 ENCOUNTER — Other Ambulatory Visit: Payer: Self-pay

## 2022-03-08 ENCOUNTER — Other Ambulatory Visit: Payer: Self-pay | Admitting: Internal Medicine

## 2022-03-08 DIAGNOSIS — E119 Type 2 diabetes mellitus without complications: Secondary | ICD-10-CM

## 2022-03-08 DIAGNOSIS — R0981 Nasal congestion: Secondary | ICD-10-CM

## 2022-03-08 MED ORDER — FLUTICASONE PROPIONATE 50 MCG/ACT NA SUSP
NASAL | 1 refills | Status: DC
  Filled 2022-03-08: qty 16, 30d supply, fill #0
  Filled 2022-04-10: qty 16, 30d supply, fill #1

## 2022-03-08 MED ORDER — INSULIN LISPRO 100 UNIT/ML IJ SOLN
35.0000 [IU] | Freq: Every day | INTRAMUSCULAR | 1 refills | Status: DC
  Filled 2022-03-08 – 2022-04-07 (×2): qty 30, 85d supply, fill #0
  Filled 2022-07-10: qty 30, 85d supply, fill #1

## 2022-03-10 ENCOUNTER — Other Ambulatory Visit: Payer: Self-pay

## 2022-03-21 ENCOUNTER — Encounter (HOSPITAL_COMMUNITY): Payer: Self-pay | Admitting: Psychiatry

## 2022-03-23 ENCOUNTER — Emergency Department (HOSPITAL_BASED_OUTPATIENT_CLINIC_OR_DEPARTMENT_OTHER): Payer: Medicare Other

## 2022-03-23 ENCOUNTER — Other Ambulatory Visit: Payer: Self-pay

## 2022-03-23 ENCOUNTER — Inpatient Hospital Stay (HOSPITAL_BASED_OUTPATIENT_CLINIC_OR_DEPARTMENT_OTHER)
Admission: EM | Admit: 2022-03-23 | Discharge: 2022-03-24 | DRG: 637 | Disposition: A | Discharge status: Home / Self Care | Payer: Medicare Other | Attending: Internal Medicine | Admitting: Internal Medicine

## 2022-03-23 ENCOUNTER — Encounter (HOSPITAL_BASED_OUTPATIENT_CLINIC_OR_DEPARTMENT_OTHER): Payer: Self-pay

## 2022-03-23 DIAGNOSIS — Z7951 Long term (current) use of inhaled steroids: Secondary | ICD-10-CM

## 2022-03-23 DIAGNOSIS — N179 Acute kidney failure, unspecified: Secondary | ICD-10-CM | POA: Diagnosis not present

## 2022-03-23 DIAGNOSIS — R1033 Periumbilical pain: Secondary | ICD-10-CM

## 2022-03-23 DIAGNOSIS — E1143 Type 2 diabetes mellitus with diabetic autonomic (poly)neuropathy: Secondary | ICD-10-CM | POA: Diagnosis present

## 2022-03-23 DIAGNOSIS — R112 Nausea with vomiting, unspecified: Secondary | ICD-10-CM | POA: Diagnosis not present

## 2022-03-23 DIAGNOSIS — Z79890 Hormone replacement therapy (postmenopausal): Secondary | ICD-10-CM | POA: Diagnosis not present

## 2022-03-23 DIAGNOSIS — Z7902 Long term (current) use of antithrombotics/antiplatelets: Secondary | ICD-10-CM | POA: Diagnosis not present

## 2022-03-23 DIAGNOSIS — R1110 Vomiting, unspecified: Secondary | ICD-10-CM | POA: Diagnosis present

## 2022-03-23 DIAGNOSIS — E109 Type 1 diabetes mellitus without complications: Secondary | ICD-10-CM | POA: Diagnosis present

## 2022-03-23 DIAGNOSIS — I21A1 Myocardial infarction type 2: Secondary | ICD-10-CM | POA: Diagnosis present

## 2022-03-23 DIAGNOSIS — Z7982 Long term (current) use of aspirin: Secondary | ICD-10-CM

## 2022-03-23 DIAGNOSIS — F32A Depression, unspecified: Secondary | ICD-10-CM | POA: Diagnosis present

## 2022-03-23 DIAGNOSIS — I214 Non-ST elevation (NSTEMI) myocardial infarction: Principal | ICD-10-CM

## 2022-03-23 DIAGNOSIS — E7800 Pure hypercholesterolemia, unspecified: Secondary | ICD-10-CM | POA: Diagnosis present

## 2022-03-23 DIAGNOSIS — F419 Anxiety disorder, unspecified: Secondary | ICD-10-CM | POA: Diagnosis present

## 2022-03-23 DIAGNOSIS — Z8249 Family history of ischemic heart disease and other diseases of the circulatory system: Secondary | ICD-10-CM

## 2022-03-23 DIAGNOSIS — I2510 Atherosclerotic heart disease of native coronary artery without angina pectoris: Secondary | ICD-10-CM | POA: Diagnosis present

## 2022-03-23 DIAGNOSIS — E039 Hypothyroidism, unspecified: Secondary | ICD-10-CM | POA: Diagnosis present

## 2022-03-23 DIAGNOSIS — I428 Other cardiomyopathies: Secondary | ICD-10-CM | POA: Diagnosis present

## 2022-03-23 DIAGNOSIS — Z955 Presence of coronary angioplasty implant and graft: Secondary | ICD-10-CM

## 2022-03-23 DIAGNOSIS — E1010 Type 1 diabetes mellitus with ketoacidosis without coma: Secondary | ICD-10-CM | POA: Diagnosis not present

## 2022-03-23 DIAGNOSIS — Z951 Presence of aortocoronary bypass graft: Secondary | ICD-10-CM | POA: Diagnosis not present

## 2022-03-23 DIAGNOSIS — Z87891 Personal history of nicotine dependence: Secondary | ICD-10-CM

## 2022-03-23 DIAGNOSIS — K3184 Gastroparesis: Secondary | ICD-10-CM | POA: Diagnosis not present

## 2022-03-23 DIAGNOSIS — Z79899 Other long term (current) drug therapy: Secondary | ICD-10-CM | POA: Diagnosis not present

## 2022-03-23 DIAGNOSIS — K449 Diaphragmatic hernia without obstruction or gangrene: Secondary | ICD-10-CM | POA: Diagnosis present

## 2022-03-23 DIAGNOSIS — I252 Old myocardial infarction: Secondary | ICD-10-CM

## 2022-03-23 DIAGNOSIS — Z9641 Presence of insulin pump (external) (internal): Secondary | ICD-10-CM | POA: Diagnosis present

## 2022-03-23 DIAGNOSIS — E1065 Type 1 diabetes mellitus with hyperglycemia: Secondary | ICD-10-CM

## 2022-03-23 DIAGNOSIS — Z961 Presence of intraocular lens: Secondary | ICD-10-CM | POA: Diagnosis present

## 2022-03-23 DIAGNOSIS — E1043 Type 1 diabetes mellitus with diabetic autonomic (poly)neuropathy: Secondary | ICD-10-CM | POA: Diagnosis present

## 2022-03-23 DIAGNOSIS — Z794 Long term (current) use of insulin: Secondary | ICD-10-CM

## 2022-03-23 DIAGNOSIS — K219 Gastro-esophageal reflux disease without esophagitis: Secondary | ICD-10-CM | POA: Diagnosis present

## 2022-03-23 DIAGNOSIS — K6289 Other specified diseases of anus and rectum: Secondary | ICD-10-CM | POA: Diagnosis present

## 2022-03-23 DIAGNOSIS — Z818 Family history of other mental and behavioral disorders: Secondary | ICD-10-CM

## 2022-03-23 DIAGNOSIS — E785 Hyperlipidemia, unspecified: Secondary | ICD-10-CM

## 2022-03-23 LAB — COMPREHENSIVE METABOLIC PANEL
ALT: 26 U/L (ref 0–44)
AST: 58 U/L — ABNORMAL HIGH (ref 15–41)
Albumin: 5.3 g/dL — ABNORMAL HIGH (ref 3.5–5.0)
Alkaline Phosphatase: 96 U/L (ref 38–126)
Anion gap: 19 — ABNORMAL HIGH (ref 5–15)
BUN: 26 mg/dL — ABNORMAL HIGH (ref 6–20)
CO2: 18 mmol/L — ABNORMAL LOW (ref 22–32)
Calcium: 10.9 mg/dL — ABNORMAL HIGH (ref 8.9–10.3)
Chloride: 98 mmol/L (ref 98–111)
Creatinine, Ser: 1.51 mg/dL — ABNORMAL HIGH (ref 0.44–1.00)
GFR, Estimated: 41 mL/min — ABNORMAL LOW (ref >60)
Glucose, Bld: 222 mg/dL — ABNORMAL HIGH (ref 70–99)
Potassium: 4.2 mmol/L (ref 3.5–5.1)
Sodium: 135 mmol/L (ref 135–145)
Total Bilirubin: 0.8 mg/dL (ref 0.3–1.2)
Total Protein: 9.7 g/dL — ABNORMAL HIGH (ref 6.5–8.1)

## 2022-03-23 LAB — URINALYSIS, ROUTINE W REFLEX MICROSCOPIC
Bacteria, UA: NONE SEEN
Bilirubin Urine: NEGATIVE
Glucose, UA: 500 mg/dL — AB
Ketones, ur: 40 mg/dL — AB
Leukocytes,Ua: NEGATIVE
Nitrite: NEGATIVE
Protein, ur: 100 mg/dL — AB
Specific Gravity, Urine: >1.046 — ABNORMAL HIGH (ref 1.005–1.030)
pH: 6 (ref 5.0–8.0)

## 2022-03-23 LAB — I-STAT VENOUS BLOOD GAS, ED
Acid-base deficit: 7 mmol/L — ABNORMAL HIGH (ref 0.0–2.0)
Bicarbonate: 16.5 mmol/L — ABNORMAL LOW (ref 20.0–28.0)
Calcium, Ion: 1.22 mmol/L (ref 1.15–1.40)
HCT: 40 % (ref 36.0–46.0)
Hemoglobin: 13.6 g/dL (ref 12.0–15.0)
O2 Saturation: 62 %
Patient temperature: 99.9
Potassium: 3.9 mmol/L (ref 3.5–5.1)
Sodium: 135 mmol/L (ref 135–145)
TCO2: 17 mmol/L — ABNORMAL LOW (ref 22–32)
pCO2, Ven: 29.9 mmHg — ABNORMAL LOW (ref 44–60)
pH, Ven: 7.353 (ref 7.25–7.43)
pO2, Ven: 34 mmHg (ref 32–45)

## 2022-03-23 LAB — CBG MONITORING, ED
Glucose-Capillary: 204 mg/dL — ABNORMAL HIGH (ref 70–99)
Glucose-Capillary: 221 mg/dL — ABNORMAL HIGH (ref 70–99)
Glucose-Capillary: 247 mg/dL — ABNORMAL HIGH (ref 70–99)
Glucose-Capillary: 155 mg/dL — ABNORMAL HIGH (ref 70–99)
Glucose-Capillary: 248 mg/dL — ABNORMAL HIGH (ref 70–99)
Glucose-Capillary: 81 mg/dL (ref 70–99)
Glucose-Capillary: 122 mg/dL — ABNORMAL HIGH (ref 70–99)
Glucose-Capillary: 248 mg/dL — ABNORMAL HIGH (ref 70–99)
Glucose-Capillary: 313 mg/dL — ABNORMAL HIGH (ref 70–99)
Glucose-Capillary: 344 mg/dL — ABNORMAL HIGH (ref 70–99)

## 2022-03-23 LAB — BASIC METABOLIC PANEL
Anion gap: 17 — ABNORMAL HIGH (ref 5–15)
Anion gap: 17 — ABNORMAL HIGH (ref 5–15)
Anion gap: 13 (ref 5–15)
BUN: 26 mg/dL — ABNORMAL HIGH (ref 6–20)
BUN: 26 mg/dL — ABNORMAL HIGH (ref 6–20)
BUN: 26 mg/dL — ABNORMAL HIGH (ref 6–20)
CO2: 15 mmol/L — ABNORMAL LOW (ref 22–32)
CO2: 15 mmol/L — ABNORMAL LOW (ref 22–32)
CO2: 18 mmol/L — ABNORMAL LOW (ref 22–32)
Calcium: 9.6 mg/dL (ref 8.9–10.3)
Calcium: 9.4 mg/dL (ref 8.9–10.3)
Calcium: 9.5 mg/dL (ref 8.9–10.3)
Chloride: 102 mmol/L (ref 98–111)
Chloride: 101 mmol/L (ref 98–111)
Chloride: 103 mmol/L (ref 98–111)
Creatinine, Ser: 1.26 mg/dL — ABNORMAL HIGH (ref 0.44–1.00)
Creatinine, Ser: 1.16 mg/dL — ABNORMAL HIGH (ref 0.44–1.00)
Creatinine, Ser: 1.33 mg/dL — ABNORMAL HIGH (ref 0.44–1.00)
GFR, Estimated: 51 mL/min — ABNORMAL LOW (ref >60)
GFR, Estimated: 56 mL/min — ABNORMAL LOW (ref >60)
GFR, Estimated: 48 mL/min — ABNORMAL LOW (ref >60)
Glucose, Bld: 194 mg/dL — ABNORMAL HIGH (ref 70–99)
Glucose, Bld: 332 mg/dL — ABNORMAL HIGH (ref 70–99)
Glucose, Bld: 151 mg/dL — ABNORMAL HIGH (ref 70–99)
Potassium: 4.5 mmol/L (ref 3.5–5.1)
Potassium: 4.4 mmol/L (ref 3.5–5.1)
Potassium: 4.2 mmol/L (ref 3.5–5.1)
Sodium: 134 mmol/L — ABNORMAL LOW (ref 135–145)
Sodium: 133 mmol/L — ABNORMAL LOW (ref 135–145)
Sodium: 134 mmol/L — ABNORMAL LOW (ref 135–145)

## 2022-03-23 LAB — CBC WITH DIFFERENTIAL/PLATELET
Abs Immature Granulocytes: 0.04 10*3/uL (ref 0.00–0.07)
Basophils Absolute: 0 10*3/uL (ref 0.0–0.1)
Basophils Relative: 0 %
Eosinophils Absolute: 0.1 10*3/uL (ref 0.0–0.5)
Eosinophils Relative: 1 %
HCT: 40.1 % (ref 36.0–46.0)
Hemoglobin: 13.6 g/dL (ref 12.0–15.0)
Immature Granulocytes: 0 %
Lymphocytes Relative: 11 %
Lymphs Abs: 1.4 10*3/uL (ref 0.7–4.0)
MCH: 30.1 pg (ref 26.0–34.0)
MCHC: 33.9 g/dL (ref 30.0–36.0)
MCV: 88.7 fL (ref 80.0–100.0)
Monocytes Absolute: 0.7 10*3/uL (ref 0.1–1.0)
Monocytes Relative: 5 %
Neutro Abs: 11.1 10*3/uL — ABNORMAL HIGH (ref 1.7–7.7)
Neutrophils Relative %: 83 %
Platelets: 563 10*3/uL — ABNORMAL HIGH (ref 150–400)
RBC: 4.52 MIL/uL (ref 3.87–5.11)
RDW: 14 % (ref 11.5–15.5)
WBC: 13.5 10*3/uL — ABNORMAL HIGH (ref 4.0–10.5)
nRBC: 0 % (ref 0.0–0.2)

## 2022-03-23 LAB — GLUCOSE, CAPILLARY
Glucose-Capillary: 261 mg/dL — ABNORMAL HIGH (ref 70–99)
Glucose-Capillary: 191 mg/dL — ABNORMAL HIGH (ref 70–99)
Glucose-Capillary: 132 mg/dL — ABNORMAL HIGH (ref 70–99)
Glucose-Capillary: 167 mg/dL — ABNORMAL HIGH (ref 70–99)

## 2022-03-23 LAB — TROPONIN I (HIGH SENSITIVITY)
Troponin I (High Sensitivity): 63 ng/L — ABNORMAL HIGH (ref <18)
Troponin I (High Sensitivity): 41 ng/L — ABNORMAL HIGH (ref <18)

## 2022-03-23 LAB — OCCULT BLOOD X 1 CARD TO LAB, STOOL: Fecal Occult Bld: NEGATIVE

## 2022-03-23 LAB — OSMOLALITY: Osmolality: 313 mOsm/kg — ABNORMAL HIGH (ref 275–295)

## 2022-03-23 LAB — BETA-HYDROXYBUTYRIC ACID
Beta-Hydroxybutyric Acid: 3.47 mmol/L — ABNORMAL HIGH (ref 0.05–0.27)
Beta-Hydroxybutyric Acid: 0.7 mmol/L — ABNORMAL HIGH (ref 0.05–0.27)

## 2022-03-23 LAB — PROTIME-INR
INR: 1 (ref 0.8–1.2)
Prothrombin Time: 13.3 seconds (ref 11.4–15.2)

## 2022-03-23 LAB — LIPASE, BLOOD: Lipase: <10 U/L — ABNORMAL LOW (ref 11–51)

## 2022-03-23 LAB — HCG, SERUM, QUALITATIVE: Preg, Serum: NEGATIVE

## 2022-03-23 MED ORDER — SODIUM CHLORIDE 0.9 % IV SOLN
2.0000 g | Freq: Once | INTRAVENOUS | Status: AC
  Administered 2022-03-23: 2 g via INTRAVENOUS
  Filled 2022-03-23: qty 20

## 2022-03-23 MED ORDER — LACTATED RINGERS IV BOLUS
1000.0000 mL | Freq: Once | INTRAVENOUS | Status: AC
  Administered 2022-03-23: 1000 mL via INTRAVENOUS

## 2022-03-23 MED ORDER — INSULIN PUMP
Freq: Three times a day (TID) | SUBCUTANEOUS | Status: DC
  Filled 2022-03-23: qty 1

## 2022-03-23 MED ORDER — DIPHENHYDRAMINE HCL 25 MG PO CAPS
25.0000 mg | ORAL_CAPSULE | Freq: Every day | ORAL | Status: DC
  Administered 2022-03-23: 50 mg via ORAL
  Filled 2022-03-23: qty 2

## 2022-03-23 MED ORDER — ASPIRIN 81 MG PO TBEC
81.0000 mg | DELAYED_RELEASE_TABLET | Freq: Every day | ORAL | Status: DC
  Administered 2022-03-24: 81 mg via ORAL
  Filled 2022-03-23: qty 1

## 2022-03-23 MED ORDER — PANTOPRAZOLE SODIUM 40 MG PO TBEC
40.0000 mg | DELAYED_RELEASE_TABLET | Freq: Two times a day (BID) | ORAL | Status: DC
  Administered 2022-03-23 – 2022-03-24 (×2): 40 mg via ORAL
  Filled 2022-03-23 (×2): qty 1

## 2022-03-23 MED ORDER — INSULIN REGULAR(HUMAN) IN NACL 100-0.9 UT/100ML-% IV SOLN
INTRAVENOUS | Status: DC
  Administered 2022-03-23: 4.6 [IU]/h via INTRAVENOUS
  Filled 2022-03-23: qty 100

## 2022-03-23 MED ORDER — POTASSIUM CHLORIDE 10 MEQ/100ML IV SOLN
10.0000 meq | INTRAVENOUS | Status: AC
  Administered 2022-03-23 (×2): 10 meq via INTRAVENOUS
  Filled 2022-03-23 (×2): qty 100

## 2022-03-23 MED ORDER — INSULIN REGULAR(HUMAN) IN NACL 100-0.9 UT/100ML-% IV SOLN
INTRAVENOUS | Status: DC
  Administered 2022-03-23: 3 [IU]/h via INTRAVENOUS

## 2022-03-23 MED ORDER — SODIUM CHLORIDE 0.9 % IV SOLN: INTRAVENOUS | Status: AC

## 2022-03-23 MED ORDER — METOPROLOL SUCCINATE ER 25 MG PO TB24
25.0000 mg | ORAL_TABLET | Freq: Every day | ORAL | Status: DC
  Administered 2022-03-24: 25 mg via ORAL
  Filled 2022-03-23: qty 1

## 2022-03-23 MED ORDER — DEXTROSE 50 % IV SOLN: 0.0000 mL | INTRAVENOUS | Status: DC | PRN

## 2022-03-23 MED ORDER — MORPHINE SULFATE (PF) 4 MG/ML IV SOLN
4.0000 mg | Freq: Once | INTRAVENOUS | Status: AC
  Administered 2022-03-23: 4 mg via INTRAVENOUS
  Filled 2022-03-23: qty 1

## 2022-03-23 MED ORDER — DULOXETINE HCL 30 MG PO CPEP
30.0000 mg | ORAL_CAPSULE | Freq: Every day | ORAL | Status: DC
  Administered 2022-03-23: 30 mg via ORAL
  Filled 2022-03-23: qty 1

## 2022-03-23 MED ORDER — ONDANSETRON HCL 4 MG/2ML IJ SOLN
4.0000 mg | Freq: Once | INTRAMUSCULAR | Status: AC
  Administered 2022-03-23: 4 mg via INTRAVENOUS
  Filled 2022-03-23: qty 2

## 2022-03-23 MED ORDER — METOCLOPRAMIDE HCL 5 MG/ML IJ SOLN: 10.0000 mg | Freq: Four times a day (QID) | INTRAMUSCULAR | Status: DC | PRN

## 2022-03-23 MED ORDER — LACTATED RINGERS IV SOLN: INTRAVENOUS | Status: DC

## 2022-03-23 MED ORDER — DEXTROSE IN LACTATED RINGERS 5 % IV SOLN: INTRAVENOUS | Status: DC

## 2022-03-23 MED ORDER — IOHEXOL 300 MG/ML  SOLN
100.0000 mL | Freq: Once | INTRAMUSCULAR | Status: AC | PRN
  Administered 2022-03-23: 60 mL via INTRAVENOUS

## 2022-03-23 MED ORDER — LEVOTHYROXINE SODIUM 100 MCG PO TABS
100.0000 ug | ORAL_TABLET | Freq: Every day | ORAL | Status: DC
  Administered 2022-03-24: 100 ug via ORAL
  Filled 2022-03-23: qty 1

## 2022-03-23 MED ORDER — METRONIDAZOLE 500 MG/100ML IV SOLN
500.0000 mg | Freq: Once | INTRAVENOUS | Status: AC
  Administered 2022-03-23: 500 mg via INTRAVENOUS
  Filled 2022-03-23: qty 100

## 2022-03-23 MED ORDER — ACETAMINOPHEN 325 MG PO TABS: 650.0000 mg | ORAL_TABLET | Freq: Four times a day (QID) | ORAL | Status: DC | PRN

## 2022-03-23 MED ORDER — ACETAMINOPHEN 650 MG RE SUPP: 650.0000 mg | Freq: Four times a day (QID) | RECTAL | Status: DC | PRN

## 2022-03-23 MED ORDER — BUPROPION HCL ER (XL) 150 MG PO TB24
150.0000 mg | ORAL_TABLET | ORAL | Status: DC
  Administered 2022-03-24: 150 mg via ORAL
  Filled 2022-03-23: qty 1

## 2022-03-23 MED ORDER — PANTOPRAZOLE SODIUM 40 MG IV SOLR
40.0000 mg | Freq: Once | INTRAVENOUS | Status: AC
  Administered 2022-03-23: 40 mg via INTRAVENOUS
  Filled 2022-03-23: qty 10

## 2022-03-23 MED ORDER — MONTELUKAST SODIUM 10 MG PO TABS
10.0000 mg | ORAL_TABLET | Freq: Every day | ORAL | Status: DC
  Administered 2022-03-23: 10 mg via ORAL
  Filled 2022-03-23: qty 1

## 2022-03-23 NOTE — Assessment & Plan Note (Signed)
Stable.  Patient still currently on aspirin and Plavix.  I have asked her to follow-up with her cardiologist to see if she needs to remain on dual antiplatelet therapy.  Her last catheterization in April 2023 showed all 3 grafts were patent and without disease.  Her being on Plavix puts her at extra risk for bleeding especially when she has intractable nausea and vomiting that could lead to a Mallory-Weiss tear.  Or in this case proctitis which causes some rectal bleeding.  I do not think that she needs another colonoscopy or flex sig.  Her last colonoscopy from 6 months ago was negative for any masses or tumors.  And her biopsy at that time was negative for any microscopic colitis.

## 2022-03-23 NOTE — ED Triage Notes (Addendum)
Pt presents POV from home with family, ambulatory on arrival   Pt presents with abd pain starting yesterday and concern for her diabetes.  Pt reports heart pounding all night last night, "felt like my heart was beating out of my chest," denies any pain associated with last night. Pt reports waking this am with Chest Pain, unable to find her Nitro, reports taking two ASA w/no relief.  Pt requesting a new prescription for Nitro.  Pt reports a hx of Hiatal hernia, unable to keep food down, vomiting green bile.  Pt reports SOB w/exertion, states, "taking in deep breaths but nothings getting in"   RT to bedside, sats 100% RA, clear throughout

## 2022-03-23 NOTE — ED Notes (Signed)
Pt given a few ice chips, per Dr. Truett Mainland.

## 2022-03-23 NOTE — Assessment & Plan Note (Addendum)
Seen on CT scan.  Given the sudden onset of her diarrhea, there was question about whether this was inflammatory versus infectious.  However, procalcitonin checked on 3/1 elevated at 1.7.  White blood cell count improved from previous day, but not normalized.  Therefore, likely suspected to have some underlying infection.  Patient discharged on p.o. Levaquin and Flagyl for abdominal coverage.

## 2022-03-23 NOTE — Assessment & Plan Note (Addendum)
Secondary to DKA, resolved with IV fluids.

## 2022-03-23 NOTE — Assessment & Plan Note (Addendum)
Underlying cause was likely proctitis.  Anion gap quickly resolved with IV fluids and insulin drip.  Patient able to restart on her own insulin pump and will continue to do so.  CBG stable by discharge.

## 2022-03-23 NOTE — H&P (Signed)
History and Physical    Jennifer Hogan [id number] DOB: 1971/01/14 DOA: 03/23/2022  DOS: the patient was seen and examined on 03/23/2022  PCP: [name], NP   Patient coming from: Home  I have personally briefly reviewed patient's old medical records in [place]  CC: abd pain, diarrhea, red blood per rectum HPI: 52 year old Caucasian female history of type 1 diabetes since the age of 69, history of coronary artery disease status post three-vessel bypass as in October 2020, history of internal hemorrhoids, gastroparesis, hypothyroidism, food intolerances, presents to the ER today with onset of diarrhea, or rectal bleeding and abdominal pain.  Patient states that she is just learning that she is having lots of food intolerances.  She states that lots of the time, she will start having abdominal discomfort.  This leads to a cycle of vomiting.  She is unable to break the cycle of vomiting and then her blood sugar starts to rise.  She states today while she was out running errands, she had rectal urgency.  She came home and had a bout of diarrhea.  While having diarrhea, she also noted some bright red blood.  Soon thereafter she started having nausea and vomiting.  She normally tries to stick it out at home and treat her self with small sips of liquids however she knew that this was probably more serious than normal and brought her self to the ER.  She noted that her sugars this morning were about 120s and then while she was having abdominal pain and vomiting her blood sugars skyrocketed to greater than 300.  She is on an insulin pump.  Some background history.  She had a normal colonoscopy and EGD in September 2023.  She had a small hiatal hernia.  No evidence of microscopic colitis.  No tumors.  Patient still currently on aspirin and Plavix.  She had a left heart catheterization in April 2023.  Her LIMA graft to distal LAD was normal.  Her RIMA graft was normal.  Her LIMA  graft to ramus intermediate was also normal.  No evidence of disease in all 3 grafts.  Arrival to the ER, temp 99.9 heart rate 118 blood pressure 133/103 satting 97% on room air.  White count 13.5, hemoglobin 13.6, platelets of 563  Sodium 135, potassium 4.2, bicarb 18, BUN of 26, creatinine 1.5 VBG pH 7.35, pCO2 29  Beta hydroxybutyric acid was elevated 3.47.  Patient started insulin drip. CT abdomen pelvis with IV contrast showed rectal wall thickening with some mild fat stranding, suspicious for infectious or inflammatory proctitis.  [facility] hospital[facility] contacted for admission   ED Course: mild DKA. Started on insulin gtts. CT abd showed proctitis.  Review of Systems:  Review of Systems  Constitutional:  Negative for chills and fever.  HENT: Negative.    Eyes: Negative.   Respiratory: Negative.    Cardiovascular: Negative.   Gastrointestinal:  Positive for abdominal pain, diarrhea, nausea and vomiting.  Genitourinary: Negative.   Musculoskeletal: Negative.   Skin: Negative.   Neurological: Negative.   Endo/Heme/Allergies: Negative.        Elevated BS on CGM  Psychiatric/Behavioral: Negative.    All other systems reviewed and are negative.   Past Medical History:  Diagnosis Date   Allergy    Anemia    Anxiety    Asthma    CAD S/P percutaneous coronary angioplasty 03/28/2017   Cataract    CHF (congestive heart failure) (HCC)    Coronary artery disease  a. s/p PTCA DES x3 in the LAD (ostial DES placed, mid DES placed, distal DES placed) and PTCA/DES x1 to intermediate branch 02/2017. b. ACS 02/08/18 with LCx stent placement.   Depression    Diabetes mellitus    type 1   DKA (diabetic ketoacidoses) 12/31/2017   DKA (diabetic ketoacidosis) ([place]) 01/13/2022   Dyspnea    with exertion and dust, no oxygen   Elevated platelet count    GERD (gastroesophageal reflux disease)    Heart murmur    never caused any problems   High cholesterol    Hypothyroidism    Ischemic  cardiomyopathy    a. EF low-normal with basal inferior akinesis, basal septal hypokinesis by echo 01/2018.   Myocardial infarction ([place]) 2019   Nausea and vomiting 12/04/2010   Non-ST elevation (NSTEMI) myocardial infarction [facility]) 02/08/2018   NSTEMI (non-ST elevated myocardial infarction) ([place])    Substance abuse ([place])    marijuana    Past Surgical History:  Procedure Laterality Date   25 GAUGE PARS PLANA VITRECTOMY WITH 20 GAUGE MVR PORT Right 11/24/2021   Procedure: 25 GAUGE PARS PLANA VITRECTOMY APPROACH RIGHT EYE, FLUID FLUID EXCHANGE;  Surgeon: Jalene Mullet, MD;  Location: [place];  Service: Ophthalmology;  Laterality: Right;   AIR/FLUID EXCHANGE Right 12/27/2021   Procedure: AIR/FLUID EXCHANGE;  Surgeon: Jalene Mullet, MD;  Location: [place];  Service: Ophthalmology;  Laterality: Right;   APPENDECTOMY     BIOPSY  09/29/2021   Procedure: BIOPSY;  Surgeon: Yetta Flock, MD;  Location: [facility];  Service: Gastroenterology;;   CATARACT EXTRACTION W/PHACO Right 11/24/2021   Procedure: CATARACT EXTRACTION AND INTRAOCULAR LENS PLACEMENT ([place]) RIGHT EYE;  Surgeon: Jalene Mullet, MD;  Location: [place];  Service: Ophthalmology;  Laterality: Right;   COLONOSCOPY     COLONOSCOPY WITH PROPOFOL N/A 09/29/2021   Procedure: COLONOSCOPY WITH PROPOFOL;  Surgeon: Yetta Flock, MD;  [facility]: [facility];  Service: Gastroenterology;  Laterality: N/A;   CORONARY ARTERY BYPASS GRAFT N/A 11/04/2018   Procedure: CORONARY ARTERY BYPASS GRAFTING (CABG), ON PUMP, TIMES THREE, USING LEFT AND RIGHT INTERNAL MAMMARY ARTERIES;  Surgeon: Wonda Olds, MD;  Location: [place];  Service: Open Heart Surgery;  Laterality: N/A;   CORONARY STENT INTERVENTION N/A 03/20/2017   Procedure: DES x 3 LAD, DES OM; Surgeon: Burnell Blanks, MD;  Location: [place] [facility];  Service: Cardiovascular;  Laterality: N/A;   CORONARY/GRAFT ACUTE MI REVASCULARIZATION N/A 02/08/2018   Procedure:  Coronary/Graft Acute MI Revascularization;  Surgeon: Belva Crome, MD;  Location: [place] [facility];  Service: Cardiovascular;  Laterality: N/A;   ESOPHAGOGASTRODUODENOSCOPY (EGD) WITH PROPOFOL N/A 09/29/2021   Procedure: ESOPHAGOGASTRODUODENOSCOPY (EGD) WITH PROPOFOL;  Surgeon: Yetta Flock, MD;  [facility];  Service: Gastroenterology;  Laterality: N/A;   GAS INSERTION Right 04/28/2021   Procedure: INSERTION OF GAS ( C3F8);  Surgeon: Jalene Mullet, MD;  Location: [place];  Service: Ophthalmology;  Laterality: Right;  Right eye   GAS/FLUID EXCHANGE Right 04/28/2021   Procedure: GAS/FLUID EXCHANGE;  Surgeon: Jalene Mullet, MD;  Location: [place];  Service: Ophthalmology;  Laterality: Right;  Right eye   LASER PHOTO ABLATION Right 11/24/2021   Procedure: ENDO LASER PAN PHOTOCOAGULATION;  Surgeon: Jalene Mullet, MD;  Location: [place];  Service: Ophthalmology;  Laterality: Right;   LASER PHOTO ABLATION Right 12/27/2021   Procedure: ENDO LASER;  Surgeon: Jalene Mullet, MD;  Location: [place];  Service: Ophthalmology;  Laterality: Right;   LEFT HEART  CATH AND CORONARY ANGIOGRAPHY N/A 03/20/2017   Procedure: LEFT HEART CATH AND CORONARY ANGIOGRAPHY;  Surgeon: Burnell Blanks, MD;  Location: [facility] [facility];  Service: Cardiovascular;  Laterality: N/A;   LEFT HEART CATH AND CORONARY ANGIOGRAPHY N/A 02/08/2018   Procedure: LEFT HEART CATH AND CORONARY ANGIOGRAPHY;  Surgeon: Belva Crome, MD;  Location: [place] [facility];  Service: Cardiovascular;  Laterality: N/A;   LEFT HEART CATH AND CORONARY ANGIOGRAPHY N/A 10/29/2018   Procedure: LEFT HEART CATH AND CORONARY ANGIOGRAPHY;  Surgeon: Burnell Blanks, MD;  Location: [place] [facility];  Service: Cardiovascular;  Laterality: N/A;   LEFT HEART CATH AND CORS/GRAFTS ANGIOGRAPHY N/A 10/04/2020   Procedure: LEFT HEART CATH AND CORS/GRAFTS ANGIOGRAPHY;  Surgeon: Sherren Mocha, MD;  Location: [place] [facility];  Service:  Cardiovascular;  Laterality: N/A;   LEFT HEART CATH AND CORS/GRAFTS ANGIOGRAPHY N/A 05/11/2021   Procedure: LEFT HEART CATH AND CORS/GRAFTS ANGIOGRAPHY;  Surgeon: Martinique, Peter M, MD;  Location: [place] [facility];  Service: Cardiovascular;  Laterality: N/A;   PARS PLANA VITRECTOMY Right 04/28/2021   Procedure: PARS PLANA VITRECTOMY WITH 25 GAUGE REMOVAL OF TRATIONAL MEMBRANE RIGHT EYE; DRAINAGE OF SUBRETINAL FLUID RIGHT EYE;  Surgeon: Jalene Mullet, MD;  Location: [facility];  Service: Ophthalmology;  Laterality: Right;  Right eye   PARS PLANA VITRECTOMY 27 GAUGE Right 12/27/2021   Procedure: PARS PLANA VITRECTOMY 27 GAUGE;  Surgeon: Jalene Mullet, MD;  Location: [place];  Service: Ophthalmology;  Laterality: Right;   PHOTOCOAGULATION WITH LASER Right 04/28/2021   Procedure: PHOTOCOAGULATION WITH LASER;  Surgeon: Jalene Mullet, MD;  Location: [facility];  Service: Ophthalmology;  Laterality: Right;  Right eye   POLYPECTOMY  09/29/2021   Procedure: POLYPECTOMY;  Surgeon: Yetta Flock, MD;  [facility];  Service: Gastroenterology;;   TEE WITHOUT CARDIOVERSION N/A 11/04/2018   Procedure: TRANSESOPHAGEAL ECHOCARDIOGRAM (TEE);  Surgeon: Wonda Olds, MD;  Location: [place];  Service: Open Heart Surgery;  Laterality: N/A;     reports that she has quit smoking. Her smoking use included cigarettes. She has never used smokeless tobacco. She reports that she does not currently use alcohol. She reports current drug use. Drug: Marijuana.  Allergies  Allergen Reactions   Atorvastatin Other (See Comments)    Myalgias    Crestor [Rosuvastatin Calcium] Other (See Comments)    Severe myalgias and joint pain   Monosodium Glutamate Nausea And Vomiting and Other (See Comments)    MSG - migraine   Zetia [Ezetimibe] Other (See Comments)    myalgias   Ciprofloxin Hcl [Ciprofloxacin] Nausea And Vomiting and Other (See Comments)    Severe migraine   Food Color Red [Red Dye] Diarrhea    Family  History  Problem Relation Age of Onset   Cancer Mother        T cell lymphoma   Hypertension Mother    Hypercalcemia Mother    OCD Father    Anxiety disorder Father    Coronary artery disease Father    Congestive Heart Failure Father    Asthma Father    Hypercalcemia Father    Hypertension Father    Colon polyps Father    Depression Maternal Grandmother    Diabetes Paternal Grandmother    Stomach cancer Neg Hx    Esophageal cancer Neg Hx    Colon cancer Neg Hx    Rectal cancer Neg Hx     Prior to Admission medications   Medication Sig Start Date  End Date Taking? Authorizing Provider  acetaminophen (TYLENOL) 325 MG tablet Take 650 mg by mouth daily as needed for mild pain, fever or headache.   Yes [provider]  albuterol (VENTOLIN HFA) 108 (90 Base) MCG/ACT inhaler INHALE 2 PUFFS INTO THE LUNGS EVERY 6 (SIX) HOURS AS NEEDED FOR WHEEZING OR SHORTNESS OF BREATH. 10/25/21  Yes Charlott Rakes, MD  amoxicillin-clavulanate (AUGMENTIN) 875-125 MG tablet Take 1 tablet by mouth 2 (two) times daily. 01/23/22  Yes Brunetta Jeans, PA-C  aspirin 81 MG EC tablet Take 1 tablet (81 mg total) by mouth daily. Patient taking differently: Take 81 mg by mouth at bedtime. 12/07/17  Yes Amin, Jeanella Flattery, MD  b complex vitamins tablet Take 1 tablet by mouth 3 (three) times a week.   Yes [provider]  budesonide (PULMICORT) 0.25 MG/2ML nebulizer solution Take 0.25 mg by nebulization daily as needed (shortness of breath/wheezing).   Yes [provider]  buPROPion (WELLBUTRIN XL) 150 MG 24 hr tablet Take 1 tablet (150 mg total) by mouth every morning. 03/06/22 06/04/22 Yes Armando Reichert, MD  clopidogrel (PLAVIX) 75 MG tablet Take 1 tablet (75 mg total) by mouth daily with breakfast. Patient taking differently: Take 75 mg by mouth at bedtime. 12/05/21  Yes Turner, Eber Hong, MD  Continuous Blood Gluc Sensor (DEXCOM G7 SENSOR) MISC Apply 1 sensor every 10 days 01/04/22  Yes  Philemon Kingdom, MD  diclofenac Sodium (VOLTAREN) 1 % GEL Apply 2-4 g topically 3 (three) times daily as needed (knee pain).   Yes [provider]  diphenhydrAMINE (BENADRYL) 25 MG tablet Take 37.5 mg by mouth at bedtime.   Yes [provider]  DULoxetine (CYMBALTA) 30 MG capsule Take 1 capsule (30 mg total) by mouth at bedtime. 03/06/22  Yes Armando Reichert, MD  fluticasone (FLONASE) 50 MCG/ACT nasal spray Insert 1 spray each nostril daily for 3 days then use 2-3 times a week as needed 03/08/22  Yes Newlin, Enobong, MD  furosemide (LASIX) 20 MG tablet Take 1 tablet (20 mg total) by mouth daily as needed (for weight gain). 10/14/20  Yes Clegg, Amy D, NP  hydrOXYzine (ATARAX) 25 MG tablet Take 1 tablet (25 mg total) by mouth 3 (three) times daily as needed for anxiety. 01/03/22 04/03/22 Yes Bahraini, Sarah A  insulin lispro (HUMALOG) 100 UNIT/ML injection Inject 0.35 mLs (35 Units total) into the skin daily via pump. 03/08/22  Yes Philemon Kingdom, MD  levothyroxine (SYNTHROID) 100 MCG tablet Take 1 tablet (100 mcg total) by mouth daily before breakfast. 01/30/22  Yes McClung, Angela M, PA-C  losartan (COZAAR) 25 MG tablet Take 1 tablet (25 mg total) by mouth daily. 01/16/22  Yes Nicole Kindred A, DO  methylcellulose (CITRUCEL) oral powder Take 1 packet by mouth daily. 12/23/21  Yes Armbruster, Carlota Raspberry, MD  metoCLOPramide (REGLAN) 5 MG tablet Take 1 tablet (5 mg total) by mouth every 8 (eight) hours as needed for nausea or vomiting. 12/23/21 12/23/22 Yes Armbruster, Carlota Raspberry, MD  metoprolol succinate (TOPROL-XL) 25 MG 24 hr tablet Take 1 tablet (25 mg total) by mouth daily. 01/16/22  Yes Nicole Kindred A, DO  mometasone-formoterol (DULERA) 100-5 MCG/ACT AERO Inhale 2 puffs into the lungs 2 (two) times daily. 02/15/22  Yes [name], NP  montelukast (SINGULAIR) 10 MG tablet Take 1 tablet (10 mg total) by mouth at bedtime. 02/15/22  Yes [name], NP  Multiple Vitamin  (MULTIVITAMIN WITH MINERALS) TABS tablet Take 1 tablet by mouth  daily.   Yes [provider]  omeprazole (PRILOSEC OTC) 20 MG tablet Take 20 mg by mouth daily as needed (acid reflux).   Yes [provider]  OVER THE COUNTER MEDICATION Take 3 tablets by mouth See admin instructions. [employer] Super Lysine Immune Support- Take 3 tablets by mouth once a day on an empty stomach   Yes [provider]  Alirocumab (PRALUENT) 150 MG/ML SOAJ Inject 1 pen  into the skin every 14 (fourteen) days. 09/23/21   [name], MD  Glucagon 3 MG/DOSE POWD Place 3 mg into the nose once as needed for up to 1 dose. 07/01/21   Philemon Kingdom, MD  neomycin-polymyxin b-dexamethasone (MAXITROL) 3.5-10000-0.1 OINT Place 1 Application into both eyes 2 (two) times daily. 03/18/22   [provider]  nitroGLYCERIN (NITROSTAT) 0.4 MG SL tablet Place 1 tablet (0.4 mg total) under the tongue every 5 (five) minutes as needed for chest pain. 10/14/20   Clegg, Amy D, NP  potassium chloride SA (KLOR-CON M) 20 MEQ tablet Take 2 tablets (40 mEq total) by mouth daily for 5 days. 01/15/22 02/19/22  Ezekiel Slocumb, DO    Physical Exam: Vitals:   03/23/22 1630 03/23/22 1700 03/23/22 1721 03/23/22 1829  BP: (!) 173/100 (!) 162/85  (!) 97/58  Pulse: (!) 126 (!) 121  99  Resp: '18 18  18  '$ Temp:   99.4 F (37.4 C) 97.8 F (36.6 C)  TempSrc:   Oral Oral  SpO2: 100% 97%  98%  Weight:    65 kg  Height:    '5\' 5"'$  (1.651 m)    Physical Exam Vitals and nursing note reviewed.  Constitutional:      General: She is not in acute distress.    Appearance: Normal appearance. She is not ill-appearing, toxic-appearing or diaphoretic.  HENT:     Head: Normocephalic and atraumatic.     Nose: Nose normal.  Eyes:     General: No scleral icterus. Cardiovascular:     Rate and Rhythm: Normal rate and regular rhythm.     Pulses: Normal pulses.  Pulmonary:     Effort: Pulmonary effort is normal. No  respiratory distress.     Breath sounds: Normal breath sounds.  Abdominal:     General: Abdomen is flat. Bowel sounds are normal. There is no distension.     Palpations: Abdomen is soft.     Tenderness: There is no abdominal tenderness.  Musculoskeletal:     Right lower leg: No edema.     Left lower leg: No edema.  Skin:    General: Skin is warm and dry.     Capillary Refill: Capillary refill takes less than 2 seconds.  Neurological:     General: No focal deficit present.     Mental Status: She is alert and oriented to person, place, and time.      Labs on Admission: I have personally reviewed following labs and imaging studies  CBC: Recent Labs  Lab 03/23/22 0900 03/23/22 1014  WBC 13.5*  --   NEUTROABS 11.1*  --   HGB 13.6 13.6  HCT 40.1 40.0  MCV 88.7  --   PLT 563*  --    Basic Metabolic Panel: Recent Labs  Lab 03/23/22 0900 03/23/22 1014 03/23/22 1145 03/23/22 1718  NA 135 135 134* 133*  K 4.2 3.9 4.5 4.4  CL 98  --  102 101  CO2 18*  --  15* 15*  GLUCOSE 222*  --  194* 332*  BUN 26*  --  26* 26*  CREATININE 1.51*  --  1.26* 1.16*  CALCIUM 10.9*  --  9.6 9.4   GFR: Estimated Creatinine Clearance: 50.5 mL/min (A) (by C-G formula based on SCr of 1.16 mg/dL (H)). Liver Function Tests: Recent Labs  Lab 03/23/22 0900  AST 58*  ALT 26  ALKPHOS 96  BILITOT 0.8  PROT 9.7*  ALBUMIN 5.3*   Recent Labs  Lab 03/23/22 0900  LIPASE <10*   No results for input(s): "AMMONIA" in the last 168 hours. Coagulation Profile: Recent Labs  Lab 03/23/22 0900  INR 1.0   Cardiac Enzymes: Recent Labs  Lab 03/23/22 0900 03/23/22 1054  TROPONINIHS 63* 41*   BNP (last 3 results) No results for input(s): "PROBNP" in the last 8760 hours. HbA1C: No results for input(s): "HGBA1C" in the last 72 hours. CBG: Recent Labs  Lab 03/23/22 1524 03/23/22 1631 03/23/22 1718 03/23/22 1825 03/23/22 1909  GLUCAP 248* 344* 313* 261* 191*   Lipid Profile: No results  for input(s): "CHOL", "HDL", "LDLCALC", "TRIG", "CHOLHDL", "LDLDIRECT" in the last 72 hours. Thyroid Function Tests: No results for input(s): "TSH", "T4TOTAL", "FREET4", "T3FREE", "THYROIDAB" in the last 72 hours. Anemia Panel: No results for input(s): "VITAMINB12", "FOLATE", "FERRITIN", "TIBC", "IRON", "RETICCTPCT" in the last 72 hours. Urine analysis:    Component Value Date/Time   COLORURINE YELLOW 03/23/2022 1240   APPEARANCEUR CLEAR 03/23/2022 1240   LABSPEC >1.046 (H) 03/23/2022 1240   PHURINE 6.0 03/23/2022 1240   GLUCOSEU 500 (A) 03/23/2022 1240   GLUCOSEU NEGATIVE 11/23/2015 0831   HGBUR MODERATE (A) 03/23/2022 1240   BILIRUBINUR NEGATIVE 03/23/2022 1240   BILIRUBINUR neg 12/07/2015 0843   KETONESUR 40 (A) 03/23/2022 1240   PROTEINUR 100 (A) 03/23/2022 1240   UROBILINOGEN 1.0 12/07/2015 0843   UROBILINOGEN 0.2 11/23/2015 0831   NITRITE NEGATIVE 03/23/2022 1240   LEUKOCYTESUR NEGATIVE 03/23/2022 1240    Radiological Exams on Admission: I have personally reviewed images CT Abdomen Pelvis W Contrast  Result Date: 03/23/2022 CLINICAL DATA:  Abdominal pain. EXAM: CT ABDOMEN AND PELVIS WITH CONTRAST TECHNIQUE: Multidetector CT imaging of the abdomen and pelvis was performed using the standard protocol following bolus administration of intravenous contrast. RADIATION DOSE REDUCTION: This exam was performed according to the departmental dose-optimization program which includes automated exposure control, adjustment of the mA and/or kV according to patient size and/or use of iterative reconstruction technique. CONTRAST:  13m OMNIPAQUE IOHEXOL 300 MG/ML  SOLN COMPARISON:  CT abdomen/pelvis 05/30/2021 FINDINGS: Lower chest: The lung bases are clear. The imaged heart is unremarkable. Hepatobiliary: The liver and gallbladder are unremarkable. There is no biliary ductal dilatation. Pancreas: Unremarkable. Spleen: Unremarkable. Adrenals/Urinary Tract: The adrenals are unremarkable. The kidneys  are unremarkable, with no focal lesion, stone, hydronephrosis, or hydroureter. The bladder is unremarkable. There is symmetric excretion of contrast into the collecting systems on the delayed images. Stomach/Bowel: The stomach is unremarkable. There is no evidence of bowel obstruction. There is wall thickening in the rectum with mild surrounding fat stranding (2-64). And possible mild wall thickening in the colon at the hepatic flexure and transverse colon, though the colon is underdistended at these levels. The appendix is surgically absent. Vascular/Lymphatic: There is mild calcified plaque in the nonaneurysmal abdominal aorta. The major branch vessels are patent. The main portal and splenic veins are patent. There is no abdominopelvic lymphadenopathy. Reproductive: The uterus and adnexa are unremarkable. Other: There is no ascites or free air. Musculoskeletal: There is advanced disc  space narrowing and degenerative endplate change at [id number] and L4-L5. There is no acute osseous abnormality or suspicious osseous lesion. IMPRESSION: 1. Wall thickening in the rectum with mild surrounding fat stranding suspicious for infectious or inflammatory proctitis, and possible mild wall thickening in the hepatic flexure and transverse colon, though the colon is underdistended at these levels. Recommend outpatient colonoscopy after resolution of acute symptoms to exclude underlying mass lesion. 2. No other acute findings in the abdomen or pelvis. Electronically Signed   By: Valetta Mole M.D.   On: 03/23/2022 11:53   DG Chest Port 1 View  Result Date: 03/23/2022 CLINICAL DATA:  Chest pain.  Dehydration EXAM: PORTABLE CHEST 1 VIEW COMPARISON:  01/14/2022 FINDINGS: Prior median sternotomy. Midline trachea. Normal heart size. No pleural effusion or pneumothorax. Clear lungs. Underlying hyperinflation and mild interstitial thickening is likely related to prior smoking. IMPRESSION: No active disease. Electronically Signed   By:  Abigail Miyamoto M.D.   On: 03/23/2022 10:40    EKG: My personal interpretation of EKG shows: sinus tachycardia    Assessment/Plan Principal Problem:   DKA, type 1 (HCC) Active Problems:   Nausea and vomiting   AKI (acute kidney injury) ([place])   Proctitis   Hiatal hernia   Hypothyroidism   Type 1 diabetes mellitus ([place])   Gastroparesis due to DM ([place])   Hyperlipidemia LDL goal <70   S/P CABG x 3    Assessment and Plan: * DKA, type 1 ([place]) Admit to med telemetry bed.  Repeat her BMP.  Patient is without any nausea or vomiting now.  Clinically, I think her DKA has resolved.  Will repeat her labs to make sure this is true.  If her DKA has resolved, she can go back on her insulin pump.  She has all of her supplies.  Hiatal hernia Patient states that she feels that her hiatal hernia is part of the reason that she has intractable nausea and vomiting at times.  She would like to start on a proton pump inhibitor.  Will start her on Protonix 40 mg twice a day.  Her hiatal hernia is only about 1 cm on her EGD but she feels that this is symptomatic for her.  Proctitis Given the sudden onset of her diarrhea, lack of fever, and recent realization that she has many food intolerances, her proctitis is most likely inflammatory.  She also states that she was constipated earlier this week and this may be another reason for her proctitis.  I do not think further antibiotics are indicated.  AKI (acute kidney injury) ([place]) Continue with IV fluids.  Repeat her BMP.  Nausea and vomiting Likely due to mild DKA.  Now resolved.  Patient asking for something to eat and drink.  S/P CABG x 3 Stable.  Patient still currently on aspirin and Plavix.  I have asked her to follow-up with her cardiologist to see if she needs to remain on dual antiplatelet therapy.  Her last catheterization in April 2023 showed all 3 grafts were patent and without disease.  Her being on Plavix puts her at extra risk for bleeding  especially when she has intractable nausea and vomiting that could lead to a Mallory-Weiss tear.  Or in this case proctitis which causes some rectal bleeding.  I do not think that she needs another colonoscopy or flex sig.  Her last colonoscopy from 6 months ago was negative for any masses or tumors.  And her biopsy at that time was negative for any  microscopic colitis.  Hyperlipidemia LDL goal <70 On Praluent by cardiology.  Gastroparesis due to DM (HCC) Stable.  Type 1 diabetes mellitus (HCC) Will restart insulin pump after her DKA is resolved.  Patient's been diabetic since the age of 53 or 52 years old.  Hypothyroidism Stable.  Continue Synthroid 100 mcg daily.   DVT prophylaxis: SCDs Code Status: Full Code Family Communication: no family at bedside  Disposition Plan: return home  Consults called: none  Admission status: Inpatient, Telemetry bed   Kristopher Oppenheim, DO [facility] 03/23/2022, 7:57 PM

## 2022-03-23 NOTE — ED Notes (Signed)
[name]/[name] at [facility] will send transport as soon as one is available.-ABB(NS)

## 2022-03-23 NOTE — ED Provider Notes (Signed)
[facility] Provider Note   CSN: [id number] Arrival date & time: 03/23/22  [id number]     History  Chief Complaint  Patient presents with   Chest Pain    Jennifer Hogan is a 52 y.o. female with extensive history of DM T1, NSTEMI, CAD, Taksubto cardiomyopathy, depression, hyperlipidemia, GERD, asthma, polysubstance abuse, prior CABG x3, anemia, cardiac murmur, and anxiety presenting to the ED with multiple complaints.  States she feels as though "something is wrong", and that she usually feels this way when her blood sugar is too low.  Patient reports centralized non-radiating abdominal pain and palpitations starting yesterday.  Reports palpitations continued throughout the night, and woke up this morning with chest pain.  Chest pain described as non-radiating over the left chest, worse with exertion, constant, and dull/achy.  Denies back pain or leg weakness.  States she "takes deep breaths in, but nothing gets in".  Has been unable to find her nitro, and is requesting a new prescription.  Took 2 tabs ASA without relief.  Also known Hx of hiatal hernia.  Reports increased vomiting and difficulty keeping food down over the last 1-2 days as well, in addition to some bright red blood with her bowel movement yesterday.  Denies dark tarry stools, or blood in stool since.  Known hx of hemorrhoids.  Initial CBG 221 at 0850, rechecked 0920 and is now 204.  On plavix daily.  Accompanied by family friend.  The history is provided by the patient and medical records.  Chest Pain     Home Medications Prior to Admission medications   Medication Sig Start Date End Date Taking? Authorizing Provider  acetaminophen (TYLENOL) 325 MG tablet Take 650 mg by mouth daily as needed for mild pain, fever or headache.   Yes [provider]  albuterol (VENTOLIN HFA) 108 (90 Base) MCG/ACT inhaler INHALE 2 PUFFS INTO THE LUNGS EVERY 6 (SIX) HOURS AS NEEDED FOR  WHEEZING OR SHORTNESS OF BREATH. 10/25/21  Yes Charlott Rakes, MD  amoxicillin-clavulanate (AUGMENTIN) 875-125 MG tablet Take 1 tablet by mouth 2 (two) times daily. 01/23/22  Yes Brunetta Jeans, PA-C  aspirin 81 MG EC tablet Take 1 tablet (81 mg total) by mouth daily. Patient taking differently: Take 81 mg by mouth at bedtime. 12/07/17  Yes Amin, Jeanella Flattery, MD  b complex vitamins tablet Take 1 tablet by mouth 3 (three) times a week.   Yes [provider]  budesonide (PULMICORT) 0.25 MG/2ML nebulizer solution Take 0.25 mg by nebulization daily as needed (shortness of breath/wheezing).   Yes [provider]  buPROPion (WELLBUTRIN XL) 150 MG 24 hr tablet Take 1 tablet (150 mg total) by mouth every morning. 03/06/22 06/04/22 Yes Armando Reichert, MD  clopidogrel (PLAVIX) 75 MG tablet Take 1 tablet (75 mg total) by mouth daily with breakfast. Patient taking differently: Take 75 mg by mouth at bedtime. 12/05/21  Yes Turner, Eber Hong, MD  Continuous Blood Gluc Sensor (DEXCOM G7 SENSOR) MISC Apply 1 sensor every 10 days 01/04/22  Yes Philemon Kingdom, MD  diclofenac Sodium (VOLTAREN) 1 % GEL Apply 2-4 g topically 3 (three) times daily as needed (knee pain).   Yes [provider]  diphenhydrAMINE (BENADRYL) 25 MG tablet Take 37.5 mg by mouth at bedtime.   Yes [provider]  DULoxetine (CYMBALTA) 30 MG capsule Take 1 capsule (30 mg total) by mouth at bedtime. 03/06/22  Yes Armando Reichert, MD  fluticasone (FLONASE) 50 MCG/ACT nasal spray  Insert 1 spray each nostril daily for 3 days then use 2-3 times a week as needed 03/08/22  Yes Newlin, Enobong, MD  furosemide (LASIX) 20 MG tablet Take 1 tablet (20 mg total) by mouth daily as needed (for weight gain). 10/14/20  Yes Clegg, Amy D, NP  hydrOXYzine (ATARAX) 25 MG tablet Take 1 tablet (25 mg total) by mouth 3 (three) times daily as needed for anxiety. 01/03/22 04/03/22 Yes Bahraini, Sarah A  insulin lispro (HUMALOG) 100 UNIT/ML  injection Inject 0.35 mLs (35 Units total) into the skin daily via pump. 03/08/22  Yes Philemon Kingdom, MD  levothyroxine (SYNTHROID) 100 MCG tablet Take 1 tablet (100 mcg total) by mouth daily before breakfast. 01/30/22  Yes McClung, Angela M, PA-C  losartan (COZAAR) 25 MG tablet Take 1 tablet (25 mg total) by mouth daily. 01/16/22  Yes Nicole Kindred A, DO  methylcellulose (CITRUCEL) oral powder Take 1 packet by mouth daily. 12/23/21  Yes Armbruster, Carlota Raspberry, MD  metoCLOPramide (REGLAN) 5 MG tablet Take 1 tablet (5 mg total) by mouth every 8 (eight) hours as needed for nausea or vomiting. 12/23/21 12/23/22 Yes Armbruster, Carlota Raspberry, MD  metoprolol succinate (TOPROL-XL) 25 MG 24 hr tablet Take 1 tablet (25 mg total) by mouth daily. 01/16/22  Yes Nicole Kindred A, DO  mometasone-formoterol (DULERA) 100-5 MCG/ACT AERO Inhale 2 puffs into the lungs 2 (two) times daily. 02/15/22  Yes [name], NP  montelukast (SINGULAIR) 10 MG tablet Take 1 tablet (10 mg total) by mouth at bedtime. 02/15/22  Yes [name], NP  Multiple Vitamin (MULTIVITAMIN WITH MINERALS) TABS tablet Take 1 tablet by mouth daily.   Yes [provider]  omeprazole (PRILOSEC OTC) 20 MG tablet Take 20 mg by mouth daily as needed (acid reflux).   Yes [provider]  OVER THE COUNTER MEDICATION Take 3 tablets by mouth See admin instructions. [employer] Super Lysine Immune Support- Take 3 tablets by mouth once a day on an empty stomach   Yes [provider]  Alirocumab (PRALUENT) 150 MG/ML SOAJ Inject 1 pen  into the skin every 14 (fourteen) days. 09/23/21   [name], MD  Glucagon 3 MG/DOSE POWD Place 3 mg into the nose once as needed for up to 1 dose. 07/01/21   Philemon Kingdom, MD  neomycin-polymyxin b-dexamethasone (MAXITROL) 3.5-10000-0.1 OINT Place 1 Application into both eyes 2 (two) times daily. 03/18/22   [provider]  nitroGLYCERIN (NITROSTAT) 0.4 MG SL tablet Place 1 tablet  (0.4 mg total) under the tongue every 5 (five) minutes as needed for chest pain. 10/14/20   Clegg, Amy D, NP  potassium chloride SA (KLOR-CON M) 20 MEQ tablet Take 2 tablets (40 mEq total) by mouth daily for 5 days. 01/15/22 02/19/22  Ezekiel Slocumb, DO      Allergies    Atorvastatin, Crestor [rosuvastatin calcium], Monosodium glutamate, Zetia [ezetimibe], Ciprofloxin hcl [ciprofloxacin], and Food color red [red dye]    Review of Systems   Review of Systems  Cardiovascular:  Positive for chest pain.    Physical Exam Updated Vital Signs BP 91/60   Pulse 100   Temp 98.6 F (37 C) (Oral)   Resp (!) 22   SpO2 100%  Physical Exam Vitals and nursing note reviewed.  Constitutional:      General: She is not in acute distress.    Appearance: She is well-developed. She is ill-appearing and diaphoretic.     Comments: Appears clinically dehydrated  HENT:     Head: Normocephalic and atraumatic.     Mouth/Throat:     Mouth: Mucous membranes are dry.     Pharynx: Oropharynx is clear.  Eyes:     General: Gaze aligned appropriately.     Conjunctiva/sclera: Conjunctivae normal.  Cardiovascular:     Rate and Rhythm: Regular rhythm. Tachycardia present.     Pulses:          Radial pulses are 2+ on the right side and 2+ on the left side.       Dorsalis pedis pulses are 2+ on the right side and 2+ on the left side.       Posterior tibial pulses are 2+ on the right side and 2+ on the left side.     Heart sounds: Murmur (at baseline) heard.  Pulmonary:     Effort: Pulmonary effort is normal. Tachypnea (20-22 breaths/min) present. No respiratory distress.     Breath sounds: Normal breath sounds.  Abdominal:     Palpations: Abdomen is soft. There is no mass.     Tenderness: There is abdominal tenderness (Centralized).  Musculoskeletal:        General: No swelling.     Cervical back: Neck supple. No rigidity.     Right lower leg: No tenderness. No edema.     Left lower leg: No tenderness. No  edema.  Skin:    General: Skin is warm.     Capillary Refill: Capillary refill takes less than 2 seconds.     Coloration: Skin is not cyanotic, jaundiced or pale.     Findings: No erythema.  Neurological:     Mental Status: She is alert.  Psychiatric:        Mood and Affect: Mood normal.    ED Results / Procedures / Treatments   Labs (all labs ordered are listed, but only abnormal results are displayed) Labs Reviewed  CBC WITH DIFFERENTIAL/PLATELET - Abnormal; Notable for the following components:      Result Value   WBC 13.5 (*)    Platelets 563 (*)    Neutro Abs 11.1 (*)    All other components within normal limits  COMPREHENSIVE METABOLIC PANEL - Abnormal; Notable for the following components:   CO2 18 (*)    Glucose, Bld 222 (*)    BUN 26 (*)    Creatinine, Ser 1.51 (*)    Calcium 10.9 (*)    Total Protein 9.7 (*)    Albumin 5.3 (*)    AST 58 (*)    GFR, Estimated 41 (*)    Anion gap 19 (*)    All other components within normal limits  BETA-HYDROXYBUTYRIC ACID - Abnormal; Notable for the following components:   Beta-Hydroxybutyric Acid 3.47 (*)    All other components within normal limits  URINALYSIS, ROUTINE W REFLEX MICROSCOPIC - Abnormal; Notable for the following components:   Specific Gravity, Urine >1.046 (*)    Glucose, UA 500 (*)    Hgb urine dipstick MODERATE (*)    Ketones, ur 40 (*)    Protein, ur 100 (*)    All other components within normal limits  LIPASE, BLOOD - Abnormal; Notable for the following components:   Lipase <10 (*)    All other components within normal limits  BASIC METABOLIC PANEL - Abnormal; Notable for the following components:   Sodium 134 (*)    CO2 15 (*)    Glucose, Bld 194 (*)    BUN 26 (*)  Creatinine, Ser 1.26 (*)    GFR, Estimated 51 (*)    Anion gap 17 (*)    All other components within normal limits  CBG MONITORING, ED - Abnormal; Notable for the following components:   Glucose-Capillary 221 (*)    All other  components within normal limits  CBG MONITORING, ED - Abnormal; Notable for the following components:   Glucose-Capillary 204 (*)    All other components within normal limits  I-STAT VENOUS BLOOD GAS, ED - Abnormal; Notable for the following components:   pCO2, Ven 29.9 (*)    Bicarbonate 16.5 (*)    TCO2 17 (*)    Acid-base deficit 7.0 (*)    All other components within normal limits  CBG MONITORING, ED - Abnormal; Notable for the following components:   Glucose-Capillary 247 (*)    All other components within normal limits  CBG MONITORING, ED - Abnormal; Notable for the following components:   Glucose-Capillary 248 (*)    All other components within normal limits  CBG MONITORING, ED - Abnormal; Notable for the following components:   Glucose-Capillary 155 (*)    All other components within normal limits  TROPONIN I (HIGH SENSITIVITY) - Abnormal; Notable for the following components:   Troponin I (High Sensitivity) 63 (*)    All other components within normal limits  TROPONIN I (HIGH SENSITIVITY) - Abnormal; Notable for the following components:   Troponin I (High Sensitivity) 41 (*)    All other components within normal limits  PROTIME-INR  HCG, SERUM, QUALITATIVE  OCCULT BLOOD X 1 CARD TO LAB, STOOL  CBC  OSMOLALITY  CBG MONITORING, ED    EKG EKG Interpretation  Date/Time:  Thursday March 23 2022 08:46:50 EST Ventricular Rate:  133 PR Interval:  129 QRS Duration: 75 QT Interval:  283 QTC Calculation: 421 R Axis:   104 Text Interpretation: Sinus tachycardia Consider right atrial enlargement Anterior infarct, old Confirmed by Garnette Gunner [phone]) on 03/23/2022 9:30:03 AM  Radiology CT Abdomen Pelvis W Contrast  Result Date: 03/23/2022 CLINICAL DATA:  Abdominal pain. EXAM: CT ABDOMEN AND PELVIS WITH CONTRAST TECHNIQUE: Multidetector CT imaging of the abdomen and pelvis was performed using the standard protocol following bolus administration of intravenous  contrast. RADIATION DOSE REDUCTION: This exam was performed according to the departmental dose-optimization program which includes automated exposure control, adjustment of the mA and/or kV according to patient size and/or use of iterative reconstruction technique. CONTRAST:  52m OMNIPAQUE IOHEXOL 300 MG/ML  SOLN COMPARISON:  CT abdomen/pelvis 05/30/2021 FINDINGS: Lower chest: The lung bases are clear. The imaged heart is unremarkable. Hepatobiliary: The liver and gallbladder are unremarkable. There is no biliary ductal dilatation. Pancreas: Unremarkable. Spleen: Unremarkable. Adrenals/Urinary Tract: The adrenals are unremarkable. The kidneys are unremarkable, with no focal lesion, stone, hydronephrosis, or hydroureter. The bladder is unremarkable. There is symmetric excretion of contrast into the collecting systems on the delayed images. Stomach/Bowel: The stomach is unremarkable. There is no evidence of bowel obstruction. There is wall thickening in the rectum with mild surrounding fat stranding (2-64). And possible mild wall thickening in the colon at the hepatic flexure and transverse colon, though the colon is underdistended at these levels. The appendix is surgically absent. Vascular/Lymphatic: There is mild calcified plaque in the nonaneurysmal abdominal aorta. The major branch vessels are patent. The main portal and splenic veins are patent. There is no abdominopelvic lymphadenopathy. Reproductive: The uterus and adnexa are unremarkable. Other: There is no ascites or free air. Musculoskeletal: There is advanced  disc space narrowing and degenerative endplate change at [id number] and L4-L5. There is no acute osseous abnormality or suspicious osseous lesion. IMPRESSION: 1. Wall thickening in the rectum with mild surrounding fat stranding suspicious for infectious or inflammatory proctitis, and possible mild wall thickening in the hepatic flexure and transverse colon, though the colon is underdistended at these  levels. Recommend outpatient colonoscopy after resolution of acute symptoms to exclude underlying mass lesion. 2. No other acute findings in the abdomen or pelvis. Electronically Signed   By: Valetta Mole M.D.   On: 03/23/2022 11:53   DG Chest Port 1 View  Result Date: 03/23/2022 CLINICAL DATA:  Chest pain.  Dehydration EXAM: PORTABLE CHEST 1 VIEW COMPARISON:  01/14/2022 FINDINGS: Prior median sternotomy. Midline trachea. Normal heart size. No pleural effusion or pneumothorax. Clear lungs. Underlying hyperinflation and mild interstitial thickening is likely related to prior smoking. IMPRESSION: No active disease. Electronically Signed   By: Abigail Miyamoto M.D.   On: 03/23/2022 10:40    Procedures Procedures    Medications Ordered in ED Medications  lactated ringers infusion (0 mLs Intravenous Stopped 03/23/22 1248)  dextrose 5 % in lactated ringers infusion ( Intravenous New Bag/Given 03/23/22 1240)  dextrose 50 % solution 0-50 mL (has no administration in time range)  cefTRIAXone (ROCEPHIN) 2 g in sodium chloride 0.9 % 100 mL IVPB (0 g Intravenous Stopped 03/23/22 1322)    And  metroNIDAZOLE (FLAGYL) IVPB 500 mg (500 mg Intravenous New Bag/Given 03/23/22 1319)  morphine (PF) 4 MG/ML injection 4 mg (4 mg Intravenous Given 03/23/22 1011)  ondansetron (ZOFRAN) injection 4 mg (4 mg Intravenous Given 03/23/22 1011)  lactated ringers bolus 1,000 mL (0 mLs Intravenous Stopped 03/23/22 1115)  potassium chloride 10 mEq in 100 mL IVPB (0 mEq Intravenous Stopped 03/23/22 1248)  iohexol (OMNIPAQUE) 300 MG/ML solution 100 mL (60 mLs Intravenous Contrast Given 03/23/22 1118)    ED Course/ Medical Decision Making/ A&P Clinical Course as of 03/23/22 1454  Thu Mar 23, 2022  1019 Anion gap(!): 19 [AC]  1030 pH, Ven: 7.353 [AC]  1103 Lipase(!): <10 [AC]  1311 Fecal Occult Blood, POC: NEGATIVE Scant stool collected, high risk of false negative [AC]  1349 Beta-Hydroxybutyric Acid(!): 3.47 [AC]  1404 Consulted  with Dr. Sloan Leiter of hospitalist team, discussed patient's case and history in detail.  He believes patient may not be in DKA but agrees with acute proctitis and NSTEMI.  Believes gastroparesis may be playing a role.  Agrees with plan for admission.  Due to declining blood glucose the role of insulin was discussed, we are in agreement to cease insulin drip at this time, this allows patient to be admitted to the tele unit, do not believe patient needs stepdown at this time.  Plan for admission. [AC]    Clinical Course User Index [AC] Prince Rome, PA-C             HEART Score: 5                Medical Decision Making Amount and/or Complexity of Data Reviewed Labs: ordered. Decision-making details documented in ED Course. Radiology: ordered.  Risk Prescription drug management. Decision regarding hospitalization.   52 y.o. female presents to the ED for concern of Chest Pain     This involves an extensive number of treatment options, and is a complaint that carries with it a high risk of complications and morbidity.  The emergent differential diagnosis prior to evaluation includes, but is not limited to:  ACS, pneumonia, pneumothorax, pulmonary embolism,pericarditis/myocarditis, GERD, PUD, musculoskeletal, costochondritis, anxiousness  This is not an exhaustive differential.   Past Medical History / Co-morbidities / Social History: Hx of DM T1, NSTEMI, CAD, Takotsubo cardiomyopathy, depression, hyperlipidemia, GERD, asthma, polysubstance abuse, anemia, cardiac murmur, and anxiety Social Determinants of Health include: polysubstance abuse, for which counseling was provided  Additional History:  Obtained by chart review.  Notably prior ED visits, see for details  Lab Tests: I ordered, and personally interpreted labs.  The pertinent results include:   Elevated WBC 13.5, nonspecific may be related to dehydration or N/V.  No anemia UA with increased protein and ketones FOBT negative,  though may be false negative due to stool sample volume collected Initial creatinine 1.51, sequential 1.26 Initial anion gap 19, sequential 17 Lipase less than 10 Troponin initially 63, sequential 41, likely NSTEMI demand ischemia Beta hydroxybutyric acid 3.47, suggestive of DKA VBG indicates pH 7.353, bicarb 16.5, pCO2 29.9 Osmolality of 313  Imaging Studies: I ordered imaging studies including CXR.   I independently visualized and interpreted imaging which showed no acute cardiopulmonary pathology I agree with the radiologist interpretation.  Cardiac Monitoring: The patient was maintained on a cardiac monitor.  I personally viewed and interpreted the cardiac monitored which showed an underlying rhythm of: sinus tachycardia  ED Course / Critical Interventions: Pt well-appearing on exam.  Presenting with diaphoresis, chest pain, abdominal pain, N/V.  Patient states the symptoms usually accompany her glycemic emergencies.  Reports feeling dry and overall unwell. Hx of DM T1 and prior DKA.  Patient states this feels similar to prior episodes of DKA.  Given appears clinically dehydrated with reports of dehydration.  Dry mucous membranes.   CBG 204, 247, 248, 155, 81 over the last 4 hours.  Patient was transitioned to D5 IVF due to decreasing blood glucose. CT imaging negative for evidence of acute pancreatitis, bowel obstruction, however suggestive of acute proctitis.  Rocephin and Flagyl ordered. Labs without evidence of acute kidney failure, and kidney function is notably improved since fluid bolus. Anion gap, beta hydroxybutyric acid, remaining clinical/exam findings may be suggestive of DKA Hx of CABG and cardiomyopathy.  HEART score of 5, moderate risk.  Patient has taken ASA 325 mg prior to arrival and BP of 106/79.  Further ASA and nitroglycerin held, do not seem appropriate at this time.  PERC positive due to age and tachycardia. Well's score 1.5, low probability.   Unlikely pneumonia,  no cough, no leukocytosis, no fevers, CXR and exam without acute findings.  Chest pain can be exertional.  Due to patient's considerable risk plan to rule out ACS. Unlikely pneumothorax, no findings on CXR.   Unlikely pericarditis/myocarditis, GERD, PUD, as does not fit clinical picture.  No evidence of pleural effusion or pulmonary edema on CXR.   Unlikely dissection, no pulse deficit, no tearing chest pain, no neurologic complaints.   EKG findings indicate likely old infarct without evidence of acute ischemic changes, abnormal intervals, or dysrhythmia.  No significant change from prior.  Troponins 63, 41, downtrending and likely related to glycemic dysfunction and associated heart strain.   Zofran and morphine, in addition to 1L LR bolus provided to patient.  Plan to then continue with soft/gentle hydration due to known cardiomyopathy and cardiac disease.   Upon reevaluation, patient appears more clinically hydrated, no longer tachypneic though still tachycardic.  Still afebrile.  Pressure remains around 113/70.  Improved abdominal and chest pain.  Patient's clinical presentation is most consistent with DKA with  likely associated NSTEMI.  Anticipate admission, discussed with patient, who is in agreement. Consulted with hospitalist, see note above, agrees with plan for admission.  Disposition: Admission  I discussed this case with my attending, Dr. Truett Mainland.  Attending physician stated agreement with plan or made changes to plan which were implemented.     This chart was dictated using voice recognition software.  Despite best efforts to proofread, errors can occur which can change the documentation meaning.         Final Clinical Impression(s) / ED Diagnoses Final diagnoses:  NSTEMI (non-ST elevated myocardial infarction) ([place])  Type 1 diabetes mellitus with hyperglycemia ([place])  Other cardiomyopathy ([place])  Periumbilical abdominal pain    Rx / DC Orders ED Discharge Orders      None         Candace Cruise [id number]    Cristie Hem, MD 03/24/22 [phone]

## 2022-03-23 NOTE — Assessment & Plan Note (Signed)
Stable.  Continue Synthroid 100 mcg daily. 

## 2022-03-23 NOTE — Assessment & Plan Note (Signed)
On Praluent by cardiology.

## 2022-03-23 NOTE — Assessment & Plan Note (Signed)
Stable. 

## 2022-03-23 NOTE — Assessment & Plan Note (Signed)
Patient states that she feels that her hiatal hernia is part of the reason that she has intractable nausea and vomiting at times.  She would like to start on a proton pump inhibitor.  Will start her on Protonix 40 mg twice a day.  Her hiatal hernia is only about 1 cm on her EGD but she feels that this is symptomatic for her.

## 2022-03-23 NOTE — Assessment & Plan Note (Addendum)
Restarted insulin pump after DKA resolved.  Patient's been diabetic since the age of 50 or 52 years old.

## 2022-03-23 NOTE — Assessment & Plan Note (Addendum)
Likely due to mild DKA.  Now resolved.  Tolerating p.o. by time of discharge.  By patient's own request, she plans to stay on clear liquids for the next few days.

## 2022-03-23 NOTE — Subjective & Objective (Signed)
CC: abd pain, diarrhea, red blood per rectum HPI: 52 year old Caucasian female history of type 1 diabetes since the age of 20, history of coronary artery disease status post three-vessel bypass as in October 2020, history of internal hemorrhoids, gastroparesis, hypothyroidism, food intolerances, presents to the ER today with onset of diarrhea, or rectal bleeding and abdominal pain.  Patient states that she is just learning that she is having lots of food intolerances.  She states that lots of the time, she will start having abdominal discomfort.  This leads to a cycle of vomiting.  She is unable to break the cycle of vomiting and then her blood sugar starts to rise.  She states today while she was out running errands, she had rectal urgency.  She came home and had a bout of diarrhea.  While having diarrhea, she also noted some bright red blood.  Soon thereafter she started having nausea and vomiting.  She normally tries to stick it out at home and treat her self with small sips of liquids however she knew that this was probably more serious than normal and brought her self to the ER.  She noted that her sugars this morning were about 120s and then while she was having abdominal pain and vomiting her blood sugars skyrocketed to greater than 300.  She is on an insulin pump.  Some background history.  She had a normal colonoscopy and EGD in September 2023.  She had a small hiatal hernia.  No evidence of microscopic colitis.  No tumors.  Patient still currently on aspirin and Plavix.  She had a left heart catheterization in April 2023.  Her LIMA graft to distal LAD was normal.  Her RIMA graft was normal.  Her LIMA graft to ramus intermediate was also normal.  No evidence of disease in all 3 grafts.  Arrival to the ER, temp 99.9 heart rate 118 blood pressure 133/103 satting 97% on room air.  White count 13.5, hemoglobin 13.6, platelets of 563  Sodium 135, potassium 4.2, bicarb 18, BUN of 26, creatinine  1.5 VBG pH 7.35, pCO2 29  Beta hydroxybutyric acid was elevated 3.47.  Patient started insulin drip. CT abdomen pelvis with IV contrast showed rectal wall thickening with some mild fat stranding, suspicious for infectious or inflammatory proctitis.  [facility] contacted for admission

## 2022-03-24 ENCOUNTER — Encounter: Payer: Self-pay | Admitting: Gastroenterology

## 2022-03-24 ENCOUNTER — Other Ambulatory Visit: Payer: Self-pay

## 2022-03-24 DIAGNOSIS — K6289 Other specified diseases of anus and rectum: Secondary | ICD-10-CM | POA: Diagnosis not present

## 2022-03-24 DIAGNOSIS — E1010 Type 1 diabetes mellitus with ketoacidosis without coma: Secondary | ICD-10-CM | POA: Diagnosis not present

## 2022-03-24 DIAGNOSIS — N179 Acute kidney failure, unspecified: Secondary | ICD-10-CM | POA: Diagnosis not present

## 2022-03-24 DIAGNOSIS — K3184 Gastroparesis: Secondary | ICD-10-CM | POA: Diagnosis not present

## 2022-03-24 LAB — GLUCOSE, CAPILLARY
Glucose-Capillary: 192 mg/dL — ABNORMAL HIGH (ref 70–99)
Glucose-Capillary: 160 mg/dL — ABNORMAL HIGH (ref 70–99)
Glucose-Capillary: 257 mg/dL — ABNORMAL HIGH (ref 70–99)

## 2022-03-24 LAB — COMPREHENSIVE METABOLIC PANEL
ALT: 24 U/L (ref 0–44)
AST: 53 U/L — ABNORMAL HIGH (ref 15–41)
Albumin: 3.5 g/dL (ref 3.5–5.0)
Alkaline Phosphatase: 76 U/L (ref 38–126)
Anion gap: 7 (ref 5–15)
BUN: 16 mg/dL (ref 6–20)
CO2: 21 mmol/L — ABNORMAL LOW (ref 22–32)
Calcium: 8.9 mg/dL (ref 8.9–10.3)
Chloride: 106 mmol/L (ref 98–111)
Creatinine, Ser: 1.05 mg/dL — ABNORMAL HIGH (ref 0.44–1.00)
GFR, Estimated: >60 mL/min (ref >60)
Glucose, Bld: 162 mg/dL — ABNORMAL HIGH (ref 70–99)
Potassium: 4.1 mmol/L (ref 3.5–5.1)
Sodium: 134 mmol/L — ABNORMAL LOW (ref 135–145)
Total Bilirubin: 0.7 mg/dL (ref 0.3–1.2)
Total Protein: 6.7 g/dL (ref 6.5–8.1)

## 2022-03-24 LAB — CBC WITH DIFFERENTIAL/PLATELET
Abs Immature Granulocytes: 0.04 10*3/uL (ref 0.00–0.07)
Basophils Absolute: 0 10*3/uL (ref 0.0–0.1)
Basophils Relative: 0 %
Eosinophils Absolute: 0.1 10*3/uL (ref 0.0–0.5)
Eosinophils Relative: 1 %
HCT: 32.5 % — ABNORMAL LOW (ref 36.0–46.0)
Hemoglobin: 10.7 g/dL — ABNORMAL LOW (ref 12.0–15.0)
Immature Granulocytes: 0 %
Lymphocytes Relative: 22 %
Lymphs Abs: 2.7 10*3/uL (ref 0.7–4.0)
MCH: 29.8 pg (ref 26.0–34.0)
MCHC: 32.9 g/dL (ref 30.0–36.0)
MCV: 90.5 fL (ref 80.0–100.0)
Monocytes Absolute: 0.8 10*3/uL (ref 0.1–1.0)
Monocytes Relative: 6 %
Neutro Abs: 9 10*3/uL — ABNORMAL HIGH (ref 1.7–7.7)
Neutrophils Relative %: 71 %
Platelets: 383 10*3/uL (ref 150–400)
RBC: 3.59 MIL/uL — ABNORMAL LOW (ref 3.87–5.11)
RDW: 14.2 % (ref 11.5–15.5)
WBC: 12.7 10*3/uL — ABNORMAL HIGH (ref 4.0–10.5)
nRBC: 0 % (ref 0.0–0.2)

## 2022-03-24 LAB — PROCALCITONIN: Procalcitonin: 1.74 ng/mL

## 2022-03-24 LAB — MAGNESIUM: Magnesium: 1.9 mg/dL (ref 1.7–2.4)

## 2022-03-24 MED ORDER — PANTOPRAZOLE SODIUM 40 MG PO TBEC
40.0000 mg | DELAYED_RELEASE_TABLET | Freq: Two times a day (BID) | ORAL | 2 refills | Status: DC
  Filled 2022-03-24: qty 60, 30d supply, fill #0
  Filled 2022-05-15: qty 60, 30d supply, fill #1
  Filled 2022-06-02: qty 60, 30d supply, fill #2
  Filled 2022-06-02: qty 60, 30d supply, fill #0

## 2022-03-24 MED ORDER — METRONIDAZOLE 500 MG PO TABS
500.0000 mg | ORAL_TABLET | Freq: Three times a day (TID) | ORAL | 0 refills | Status: AC
  Filled 2022-03-24: qty 9, 3d supply, fill #0

## 2022-03-24 MED ORDER — ONDANSETRON HCL 4 MG PO TABS
4.0000 mg | ORAL_TABLET | Freq: Every day | ORAL | 1 refills | Status: DC | PRN
  Filled 2022-03-24: qty 30, 30d supply, fill #0
  Filled 2022-05-15: qty 30, 30d supply, fill #1

## 2022-03-24 MED ORDER — LEVOFLOXACIN 500 MG PO TABS
500.0000 mg | ORAL_TABLET | Freq: Every day | ORAL | 0 refills | Status: AC
  Filled 2022-03-24: qty 3, 3d supply, fill #0

## 2022-03-24 NOTE — Hospital Course (Signed)
52 year old female with past medical history of diabetes mellitus type 1 on insulin pump, CAD status post CABG, gastroparesis I & D internal hemorrhoids presented to the emergency room on 2/29 morning with abdominal pain, diarrhea and bright red blood per rectum starting 1 day prior.  Patient noted that her blood sugars increased to the 300s when this started happening.  When patient first presented to the emergency room, noted to have mild AKI, sugar in the 200s, but anion gap of 19.  Imaging revealed possible proctitis and patient was started on antibiotics plus insulin drip for suspected DKA.  Patient brought in for further evaluation and transferred from [facility] to [facility], anion gap had resolved with CBGs under 200.

## 2022-03-24 NOTE — Discharge Summary (Signed)
Physician Discharge Summary   Patient: Jennifer Hogan MRN: [id number] DOB: 22-Dec-1970  Admit date:     03/23/2022  Discharge date: 03/24/22  Discharge Physician: Annita Brod   PCP: [name], NP   Recommendations at discharge:   New medication: As needed Zofran New medication: Flagyl 500 mg p.o. 3 times daily x 3 days New medication: Protonix 40 mg p.o. twice daily underlying causes proctitis New medication: Levaquin 500 mg p.o. daily x 3 days  Discharge Diagnoses: Active Problems:   Proctitis   Hiatal hernia   Hypothyroidism   Type 1 diabetes mellitus (HCC)   Gastroparesis due to DM ([place])   Hyperlipidemia LDL goal <70   S/P CABG x 3  Principal Problem (Resolved):   DKA, type 1 (HCC) Resolved Problems:   Nausea and vomiting   AKI (acute kidney injury) ([place])   Vomiting  Hospital Course: 52 year old female with past medical history of diabetes mellitus type 1 on insulin pump, CAD status post CABG, gastroparesis I & D internal hemorrhoids presented to the emergency room on 2/29 morning with abdominal pain, diarrhea and bright red blood per rectum starting 1 day prior.  Patient noted that her blood sugars increased to the 300s when this started happening.  When patient first presented to the emergency room, noted to have mild AKI, sugar in the 200s, but anion gap of 19.  Imaging revealed possible proctitis and patient was started on antibiotics plus insulin drip for suspected DKA.  Patient brought in for further evaluation and transferred from [facility] to [facility], anion gap had resolved with CBGs under 200.  Assessment and Plan: * DKA, type 1 (HCC)-resolved as of 03/24/2022 Underlying cause was likely proctitis.  Anion gap quickly resolved with IV fluids and insulin drip.  Patient able to restart on her own insulin pump and will continue to do so.  CBG stable by discharge.  Hiatal hernia Patient states that she feels that her hiatal hernia is part of  the reason that she has intractable nausea and vomiting at times.  She would like to start on a proton pump inhibitor.  Will start her on Protonix 40 mg twice a day.  Her hiatal hernia is only about 1 cm on her EGD but she feels that this is symptomatic for her.  Proctitis Seen on CT scan.  Given the sudden onset of her diarrhea, there was question about whether this was inflammatory versus infectious.  However, procalcitonin checked on 3/1 elevated at 1.7.  White blood cell count improved from previous day, but not normalized.  Therefore, likely suspected to have some underlying infection.  Patient discharged on p.o. Levaquin and Flagyl for abdominal coverage.  AKI (acute kidney injury) (HCC)-resolved as of 03/24/2022 Secondary to DKA, resolved with IV fluids.  Nausea and vomiting-resolved as of 03/24/2022 Likely due to mild DKA.  Now resolved.  Tolerating p.o. by time of discharge.  By patient's own request, she plans to stay on clear liquids for the next few days.  S/P CABG x 3 Stable.  Patient still currently on aspirin and Plavix.  I have asked her to follow-up with her cardiologist to see if she needs to remain on dual antiplatelet therapy.  Her last catheterization in April 2023 showed all 3 grafts were patent and without disease.  Her being on Plavix puts her at extra risk for bleeding especially when she has intractable nausea and vomiting that could lead to a Mallory-Weiss tear.  Or in  this case proctitis which causes some rectal bleeding.  I do not think that she needs another colonoscopy or flex sig.  Her last colonoscopy from 6 months ago was negative for any masses or tumors.  And her biopsy at that time was negative for any microscopic colitis.  Hyperlipidemia LDL goal <70 On Praluent by cardiology.  Gastroparesis due to DM (HCC) Stable.  Type 1 diabetes mellitus (HCC) Restarted insulin pump after DKA resolved.  Patient's been diabetic since the age of 25 or 52 years  old.  Hypothyroidism Stable.  Continue Synthroid 100 mcg daily.         Consultants: None Procedures performed: None Disposition: Home Diet recommendation:  Discharge Diet Orders (From admission, onward)     Start     Ordered   03/24/22 0000  Diet Carb Modified        03/24/22 1336           Carb modified although patient plans to be on clear liquids for the next few days DISCHARGE MEDICATION: Allergies as of 03/24/2022       Reactions   Atorvastatin Other (See Comments)   Myalgias    Crestor [rosuvastatin Calcium] Other (See Comments)   Severe myalgias and joint pain   Monosodium Glutamate Nausea And Vomiting, Other (See Comments)   MSG - migraine   Zetia [ezetimibe] Other (See Comments)   myalgias   Ciprofloxin Hcl [ciprofloxacin] Nausea And Vomiting, Other (See Comments)   Severe migraine   Food Color Red [red Dye] Diarrhea        Medication List     STOP taking these medications    potassium chloride SA 20 MEQ tablet Commonly known as: KLOR-CON M       TAKE these medications    acetaminophen 325 MG tablet Commonly known as: TYLENOL Take 650 mg by mouth daily as needed for mild pain, fever or headache.   aspirin EC 81 MG tablet Take 1 tablet (81 mg total) by mouth daily. What changed: when to take this   b complex vitamins tablet Take 1 tablet by mouth 3 (three) times a week.   Baqsimi One Pack 3 MG/DOSE Powd Generic drug: Glucagon Place 3 mg into the nose once as needed for up to 1 dose.   budesonide 0.25 MG/2ML nebulizer solution Commonly known as: PULMICORT Take 0.25 mg by nebulization daily as needed (shortness of breath/wheezing).   buPROPion 150 MG 24 hr tablet Commonly known as: Wellbutrin XL Take 1 tablet (150 mg total) by mouth every morning.   Citrucel oral powder Generic drug: methylcellulose Take 1 packet by mouth daily.   clopidogrel 75 MG tablet Commonly known as: PLAVIX Take 1 tablet (75 mg total) by mouth daily  with breakfast. What changed: when to take this   Dexcom G7 Sensor Misc Apply 1 sensor every 10 days   diclofenac Sodium 1 % Gel Commonly known as: VOLTAREN Apply 2-4 g topically 3 (three) times daily as needed (knee pain).   diphenhydrAMINE 25 MG tablet Commonly known as: BENADRYL Take 37.5 mg by mouth at bedtime.   Dulera 100-5 MCG/ACT Aero Generic drug: mometasone-formoterol Inhale 2 puffs into the lungs 2 (two) times daily.   DULoxetine 30 MG capsule Commonly known as: Cymbalta Take 1 capsule (30 mg total) by mouth at bedtime.   fluticasone 50 MCG/ACT nasal spray Commonly known as: FLONASE Insert 1 spray each nostril daily for 3 days then use 2-3 times a week as needed   furosemide 20  MG tablet Commonly known as: LASIX Take 1 tablet (20 mg total) by mouth daily as needed (for weight gain).   hydrOXYzine 25 MG tablet Commonly known as: ATARAX Take 1 tablet (25 mg total) by mouth 3 (three) times daily as needed for anxiety.   insulin lispro 100 UNIT/ML injection Commonly known as: HumaLOG Inject 0.35 mLs (35 Units total) into the skin daily via pump.   levofloxacin 500 MG tablet Commonly known as: Levaquin Take 1 tablet (500 mg total) by mouth daily for 3 days.   levothyroxine 100 MCG tablet Commonly known as: SYNTHROID Take 1 tablet (100 mcg total) by mouth daily before breakfast.   losartan 25 MG tablet Commonly known as: COZAAR Take 1 tablet (25 mg total) by mouth daily.   metoCLOPramide 5 MG tablet Commonly known as: Reglan Take 1 tablet (5 mg total) by mouth every 8 (eight) hours as needed for nausea or vomiting.   metoprolol succinate 25 MG 24 hr tablet Commonly known as: TOPROL-XL Take 1 tablet (25 mg total) by mouth daily.   metroNIDAZOLE 500 MG tablet Commonly known as: Flagyl Take 1 tablet (500 mg total) by mouth 3 (three) times daily for 3 days.   montelukast 10 MG tablet Commonly known as: SINGULAIR Take 1 tablet (10 mg total) by mouth at  bedtime.   multivitamin with minerals Tabs tablet Take 1 tablet by mouth daily.   neomycin-polymyxin b-dexamethasone 3.5-10000-0.1 Oint Commonly known as: MAXITROL Place 1 Application into both eyes 2 (two) times daily.   nitroGLYCERIN 0.4 MG SL tablet Commonly known as: NITROSTAT Place 1 tablet (0.4 mg total) under the tongue every 5 (five) minutes as needed for chest pain.   omeprazole 20 MG tablet Commonly known as: PRILOSEC OTC Take 20 mg by mouth daily as needed (acid reflux).   ondansetron 4 MG tablet Commonly known as: Zofran Take 1 tablet (4 mg total) by mouth daily as needed for nausea or vomiting.   OVER THE COUNTER MEDICATION Take 3 tablets by mouth See admin instructions. [employer] Super Lysine Immune Support- Take 3 tablets by mouth once a day on an empty stomach   pantoprazole 40 MG tablet Commonly known as: PROTONIX Take 1 tablet (40 mg total) by mouth 2 (two) times daily.   Praluent 150 MG/ML Soaj Generic drug: Alirocumab Inject 1 pen  into the skin every 14 (fourteen) days.   Ventolin HFA 108 (90 Base) MCG/ACT inhaler Generic drug: albuterol INHALE 2 PUFFS INTO THE LUNGS EVERY 6 (SIX) HOURS AS NEEDED FOR WHEEZING OR SHORTNESS OF BREATH.        Discharge Exam: Filed Weights   03/23/22 1829 03/24/22 0011 03/24/22 0500  Weight: 65 kg 65 kg 65 kg   General: Alert and oriented x 3, no acute distress Cardiovascular: Regular rate and rhythm, S1-S2 Lungs: Clear to auscultation bilaterally  Condition at discharge: good  The results of significant diagnostics from this hospitalization (including imaging, microbiology, ancillary and laboratory) are listed below for reference.   Imaging Studies: CT Abdomen Pelvis W Contrast  Result Date: 03/23/2022 CLINICAL DATA:  Abdominal pain. EXAM: CT ABDOMEN AND PELVIS WITH CONTRAST TECHNIQUE: Multidetector CT imaging of the abdomen and pelvis was performed using the standard protocol following bolus  administration of intravenous contrast. RADIATION DOSE REDUCTION: This exam was performed according to the departmental dose-optimization program which includes automated exposure control, adjustment of the mA and/or kV according to patient size and/or use of iterative reconstruction technique. CONTRAST:  74m OMNIPAQUE IOHEXOL 300  MG/ML  SOLN COMPARISON:  CT abdomen/pelvis 05/30/2021 FINDINGS: Lower chest: The lung bases are clear. The imaged heart is unremarkable. Hepatobiliary: The liver and gallbladder are unremarkable. There is no biliary ductal dilatation. Pancreas: Unremarkable. Spleen: Unremarkable. Adrenals/Urinary Tract: The adrenals are unremarkable. The kidneys are unremarkable, with no focal lesion, stone, hydronephrosis, or hydroureter. The bladder is unremarkable. There is symmetric excretion of contrast into the collecting systems on the delayed images. Stomach/Bowel: The stomach is unremarkable. There is no evidence of bowel obstruction. There is wall thickening in the rectum with mild surrounding fat stranding (2-64). And possible mild wall thickening in the colon at the hepatic flexure and transverse colon, though the colon is underdistended at these levels. The appendix is surgically absent. Vascular/Lymphatic: There is mild calcified plaque in the nonaneurysmal abdominal aorta. The major branch vessels are patent. The main portal and splenic veins are patent. There is no abdominopelvic lymphadenopathy. Reproductive: The uterus and adnexa are unremarkable. Other: There is no ascites or free air. Musculoskeletal: There is advanced disc space narrowing and degenerative endplate change at [id number] and L4-L5. There is no acute osseous abnormality or suspicious osseous lesion. IMPRESSION: 1. Wall thickening in the rectum with mild surrounding fat stranding suspicious for infectious or inflammatory proctitis, and possible mild wall thickening in the hepatic flexure and transverse colon, though the colon  is underdistended at these levels. Recommend outpatient colonoscopy after resolution of acute symptoms to exclude underlying mass lesion. 2. No other acute findings in the abdomen or pelvis. Electronically Signed   By: Valetta Mole M.D.   On: 03/23/2022 11:53   DG Chest Port 1 View  Result Date: 03/23/2022 CLINICAL DATA:  Chest pain.  Dehydration EXAM: PORTABLE CHEST 1 VIEW COMPARISON:  01/14/2022 FINDINGS: Prior median sternotomy. Midline trachea. Normal heart size. No pleural effusion or pneumothorax. Clear lungs. Underlying hyperinflation and mild interstitial thickening is likely related to prior smoking. IMPRESSION: No active disease. Electronically Signed   By: Abigail Miyamoto M.D.   On: 03/23/2022 10:40    Microbiology: Results for orders placed or performed during the hospital encounter of 01/13/22  MRSA Next Gen by PCR, Nasal     Status: None   Collection Time: 01/13/22  6:34 PM   Specimen: Nasal Mucosa; Nasal Swab  Result Value Ref Range Status   MRSA by PCR Next Gen NOT DETECTED NOT DETECTED Final    Comment: (NOTE) The GeneXpert MRSA Assay (FDA approved for NASAL specimens only), is one component of a comprehensive MRSA colonization surveillance program. It is not intended to diagnose MRSA infection nor to guide or monitor treatment for MRSA infections. Test performance is not FDA approved in patients less than 45 years old. Performed at [facility], [place] [address], [place], [place] [address]     Labs: CBC: Recent Labs  Lab 03/23/22 0900 03/23/22 1014 03/24/22 0446  WBC 13.5*  --  12.7*  NEUTROABS 11.1*  --  9.0*  HGB 13.6 13.6 10.7*  HCT 40.1 40.0 32.5*  MCV 88.7  --  90.5  PLT 563*  --  [id number]   Basic Metabolic Panel: Recent Labs  Lab 03/23/22 0900 03/23/22 1014 03/23/22 1145 03/23/22 1718 03/23/22 1937 03/24/22 0446  NA 135 135 134* 133* 134* 134*  K 4.2 3.9 4.5 4.4 4.2 4.1  CL 98  --  102 101 103 106  CO2 18*  --  15* 15* 18* 21*   GLUCOSE 222*  --  194* 332* 151* 162*  BUN 26*  --  26* 26* 26* 16  CREATININE 1.51*  --  1.26* 1.16* 1.33* 1.05*  CALCIUM 10.9*  --  9.6 9.4 9.5 8.9  MG  --   --   --   --   --  1.9   Liver Function Tests: Recent Labs  Lab 03/23/22 0900 03/24/22 0446  AST 58* 53*  ALT 26 24  ALKPHOS 96 76  BILITOT 0.8 0.7  PROT 9.7* 6.7  ALBUMIN 5.3* 3.5   CBG: Recent Labs  Lab 03/23/22 2051 03/23/22 2226 03/24/22 0202 03/24/22 0808 03/24/22 1212  GLUCAP 132* 167* 192* 160* 257*    Discharge time spent: less than 30 minutes.  Signed: Annita Brod, MD [facility] 03/24/2022

## 2022-03-25 LAB — HEMOGLOBIN A1C
Hgb A1c MFr Bld: 8 % — ABNORMAL HIGH (ref 4.8–5.6)
Mean Plasma Glucose: 183 mg/dL

## 2022-03-27 ENCOUNTER — Encounter: Payer: Self-pay | Admitting: Gastroenterology

## 2022-03-28 ENCOUNTER — Telehealth: Payer: Self-pay

## 2022-03-28 ENCOUNTER — Ambulatory Visit: Payer: Self-pay

## 2022-03-28 DIAGNOSIS — I25119 Atherosclerotic heart disease of native coronary artery with unspecified angina pectoris: Secondary | ICD-10-CM

## 2022-03-28 NOTE — Telephone Encounter (Signed)
From the discharge call:   She said when she stands up straight she is weak and gets winded so she needs to hunch over when walking..  She said she is generally weak.   she has experienced some nausea and thinks thismay be due to the flagyl.  her last dose is today. She has taken the zofran with some relief.  she reports no fever and said her BP has been "excellent."  She said she has been tolerating a soft diet, and stated that the yogurt is delicious.  She has all of her medications including an insulin pump and cgm. She did not have any questions about the med regime   Follow-up Provider: [name], NP , appointment not until 05/17/2022. - I offered to look for an appointment for her to be seen sooner either by Ms Raul Del or another provider and she did not want to change her appointment. She said she she will call the clinic by the end of the week if her symptoms/ weakness do not improve.

## 2022-03-28 NOTE — Transitions of Care (Post Inpatient/ED Visit) (Signed)
   03/28/2022  Name: Jennifer Hogan MRN: [id number] DOB: 07-06-1970  Today's TOC FU Call Status: Today's TOC FU Call Status:: Unsuccessul Call (1st Attempt) Unsuccessful Call (1st Attempt) Date: 03/28/22  Attempted to reach the patient regarding the most recent Inpatient/ED visit. Call placed to [phone] , message left with call back requested  Follow Up Plan: Additional outreach attempts will be made to reach the patient to complete the Transitions of Care (Post Inpatient/ED visit) call.   Signature Eden Lathe, RN

## 2022-03-28 NOTE — Transitions of Care (Post Inpatient/ED Visit) (Signed)
   03/28/2022  Name: Jennifer Hogan MRN: [id number] DOB: Dec 13, 1970  Today's TOC FU Call Status: Unsuccessful Call (1st Attempt) Date: 03/28/22 [facility] FU Call Complete Date: 03/28/22  Transition Care Management Follow-up Telephone Call Date of Discharge: 03/24/22 Discharge Facility[name]) Type of Discharge: Inpatient Admission Primary Inpatient Discharge Diagnosis:: DKA How have you been since you were released from the hospital?: Better Any questions or concerns?: Yes Patient Questions/Concerns:: She said when she stands up straight she gets winded so she needs to hunch over when walking..  She said she is generally weak.   she has experienced some nausea and thinks thismay be due to the flagyl.  her last dose is today. Shehas taken the zofran with some relief.  she reports no fever and said her BP has been "excellent."  She said she has been tolerating a soft diet, and stated that the yogurt is delicious. Patient Questions/Concerns Addressed: Notified Provider of Patient Questions/Concerns  Items Reviewed: Did you receive and understand the discharge instructions provided?: Yes Medications obtained and verified?: Yes (Medications Reviewed) (she has an insulin pump and cgm.) Any new allergies since your discharge?: No Dietary orders reviewed?: Yes Type of Diet Ordered:: carb modified. Do you have support at home?: Yes People in Home: alone Name of Support/Comfort Primary Source: but has support from friends.  Home Care and Equipment/Supplies: Were [place] Ordered?: No Any new equipment or medical supplies ordered?: No  Functional Questionnaire: Do you need assistance with bathing/showering or dressing?: No Do you need assistance with meal preparation?: No Do you need assistance with eating?: No Do you have difficulty maintaining continence: No Do you need assistance with getting out of bed/getting out of a chair/moving?: No Do you have difficulty managing  or taking your medications?: No  Folllow up appointments reviewed: PCP Follow-up appointment confirmed?: Yes Date of PCP follow-up appointment?: 05/17/22 Follow-up Provider: [name], NP - i offered to look for an appointment for her to be seen sooner either by Ms Raul Del or another provider and she did not want to change her appointment. She said she she will call the clinic by the end of the week if her symptoms/ weakness do not improve. [facility] Follow-up appointment confirmed?: Yes Date of Specialist follow-up appointment?: 05/03/22 Follow-Up Specialty Provider:: endocrinology Do you need transportation to your follow-up appointment?: No (i also informed her that her insurance plan provides rides to medical appointments. She was not aware and i gave her the number for [facility] transportation) Do you understand care options if your condition(s) worsen?: Yes-patient verbalized understanding    SIGNATURE  Eden Lathe, RN

## 2022-04-03 ENCOUNTER — Ambulatory Visit (HOSPITAL_COMMUNITY): Payer: Medicaid Other | Admitting: Mental Health

## 2022-04-04 ENCOUNTER — Ambulatory Visit: Payer: Medicaid Other | Admitting: Nurse Practitioner

## 2022-04-07 ENCOUNTER — Other Ambulatory Visit: Payer: Self-pay

## 2022-04-10 ENCOUNTER — Other Ambulatory Visit: Payer: Self-pay | Admitting: Physician Assistant

## 2022-04-10 ENCOUNTER — Other Ambulatory Visit: Payer: Self-pay

## 2022-04-10 MED ORDER — LEVOTHYROXINE SODIUM 100 MCG PO TABS
100.0000 ug | ORAL_TABLET | Freq: Every day | ORAL | 0 refills | Status: DC
  Filled 2022-04-10 – 2022-05-15 (×2): qty 30, 30d supply, fill #0

## 2022-04-11 ENCOUNTER — Other Ambulatory Visit: Payer: Self-pay

## 2022-05-01 ENCOUNTER — Encounter (HOSPITAL_COMMUNITY): Payer: Self-pay

## 2022-05-02 ENCOUNTER — Encounter: Payer: Self-pay | Admitting: Internal Medicine

## 2022-05-02 NOTE — Progress Notes (Signed)
[name], female   DOB: 01/22/1971, 52 y.o.   MRN: [id number]  HPI: Jennifer Hogan is a 52 y.o.-year-old female, returning for follow-up for DM1, diagnosed in 1984, uncontrolled, with complications: CAD, s/p AMI, s/p PTCA and DES, ischemic cardiomyopathy, mildly CHF, DKA, gastroparesis.  She previously saw Dr. [name], last visit with him 11/2020 (video).  I last saw the patient 4 months ago. Now [employer].  Interim history: She has abdominal pain and vomiting - no nausea. On Reglan.  She saw GI and a gastric emptying study obtained on 12/02/2021 was normal.   She has increased urination, drinks a lot of water.  She also has blurry vision (diabetic retinopathy and cataract in the right eye)-on Ajovy injections. She was admitted in 12/2021 for DKA and was found to have elevated troponins and demand ischemia.   Also, she was admitted on 03/23/2022 for DKA, in the setting of proctitis. She feels like she is allergic to MSG, red dyes, and she started vomiting and having diarrhea if she eats these, which triggered her DKA episodes.  Reviewed HbA1c levels: Lab Results  Component Value Date   HGBA1C 8.0 (H) 03/24/2022   HGBA1C 8.0 (A) 01/04/2022   HGBA1C 8.0 (A) 07/01/2021   Insulin pump:  -since 2014 -Medtronic  CGM: -The Medtronic sensor was not affordable/accurate for her -now [employer] pharmacy benefits.  She called [name] [facility] supplier previously, but they advised her to get the Dexcom from the pharmacy.  Insulin: -Humalog  Supplies: -Medtronic  Pump settings -changes made by her since last visit-bold: - basal rates: 12 am: 0.650 >> 0.550units/h 6 am: 0.675 >> 0.625 10 am: 0.800 >> 0.700 >> 0.600 10 pm: 0.625 >> 0.700 >> 0.650 - ICR: 1:16 >> 1:17 - target: 100-120 - ISF: 55 >> 65 >> 75 - Insulin on Board: 4 h - bolus wizard: on - extended bolusing: not using - changes infusion site: q5-7 days TDD from basal insulin: 60% >> 62% TDD from bolus  insulin: 40% >> 38% Total daily dose: 25-50 units  Meter: Contour Next  She is now checking blood sugars with the Dexcom CGM:  Previously:     Lowest sugar was 40 - at night >> 50s >> 40s; she has hypoglycemia awareness at 70.  She does not have a glucagon kit at home. No previous hypoglycemia admission.  Highest sugar was 600 >> 300s >> 300s. + previous DKA admissions: 2020, 2021, 06/27/2021 (CGM defective).  - no CKD, last BUN/creatinine:  Lab Results  Component Value Date   BUN 16 03/24/2022   BUN 26 (H) 03/23/2022   CREATININE 1.05 (H) 03/24/2022   CREATININE 1.33 (H) 03/23/2022   - + HL; last set of lipids: Lab Results  Component Value Date   CHOL 298 (H) 08/15/2021   HDL 76 08/15/2021   LDLCALC 203 (H) 08/15/2021   LDLDIRECT 109 (H) 12/22/2019   TRIG 111 08/15/2021   CHOLHDL 3.9 08/15/2021  She tried Lipitor >> joint pain, mm aches >> not taking it >> sees the lipid clinic - on Praluent.  - last eye exam was in 04/2021. + DR reportedly-need records Sees retina specialist.   - no numbness and tingling in her feet.  Last foot exam 10/08/2020.  Hypothyroidism:  Pt is on levothyroxine 100 mcg daily, taken: - in am - fasting - at least 60 min from b'fast - no calcium - no iron - no multivitamins - no PPIs - + on Biotin (B  complex)  Last TSH: Lab Results  Component Value Date   TSH 2.505 05/15/2021   She also has acid reflux.   ROS: + See HPI  Past Medical History:  Diagnosis Date   Allergy    Anemia    Anxiety    Asthma    CAD S/P percutaneous coronary angioplasty 03/28/2017   Cataract    CHF (congestive heart failure) (HCC)    Coronary artery disease    a. s/p PTCA DES x3 in the LAD (ostial DES placed, mid DES placed, distal DES placed) and PTCA/DES x1 to intermediate branch 02/2017. b. ACS 02/08/18 with LCx stent placement.   Depression    Diabetes mellitus    type 1   DKA (diabetic ketoacidoses) 12/31/2017   DKA (diabetic ketoacidosis) (HCC)  01/13/2022   Dyspnea    with exertion and dust, no oxygen   Elevated platelet count    GERD (gastroesophageal reflux disease)    Heart murmur    never caused any problems   High cholesterol    Hypothyroidism    Ischemic cardiomyopathy    a. EF low-normal with basal inferior akinesis, basal septal hypokinesis by echo 01/2018.   Myocardial infarction (HCC) 2019   Nausea and vomiting 12/04/2010   Non-ST elevation (NSTEMI) myocardial infarction [facility]) 02/08/2018   NSTEMI (non-ST elevated myocardial infarction) (HCC)    Substance abuse (HCC)    marijuana   Past Surgical History:  Procedure Laterality Date   25 GAUGE PARS PLANA VITRECTOMY WITH 20 GAUGE MVR PORT Right 11/24/2021   Procedure: 25 GAUGE PARS PLANA VITRECTOMY APPROACH RIGHT EYE, FLUID FLUID EXCHANGE;  Surgeon: Carmela Rima, MD;  Location: [facility] OR;  Service: Ophthalmology;  Laterality: Right;   AIR/FLUID EXCHANGE Right 12/27/2021   Procedure: AIR/FLUID EXCHANGE;  Surgeon: Carmela Rima, MD;  Location: [facility] OR;  Service: Ophthalmology;  Laterality: Right;   APPENDECTOMY     BIOPSY  09/29/2021   Procedure: BIOPSY;  Surgeon: Benancio Deeds, MD;  Location: [facility];  Service: Gastroenterology;;   CATARACT EXTRACTION W/PHACO Right 11/24/2021   Procedure: CATARACT EXTRACTION AND INTRAOCULAR LENS PLACEMENT (IOC) RIGHT EYE;  Surgeon: Carmela Rima, MD;  Location: [facility] OR;  Service: Ophthalmology;  Laterality: Right;   COLONOSCOPY     COLONOSCOPY WITH PROPOFOL N/A 09/29/2021   Procedure: COLONOSCOPY WITH PROPOFOL;  Surgeon: Benancio Deeds, MD;  Location: [facility];  Service: Gastroenterology;  Laterality: N/A;   CORONARY ARTERY BYPASS GRAFT N/A 11/04/2018   Procedure: CORONARY ARTERY BYPASS GRAFTING (CABG), ON PUMP, TIMES THREE, USING LEFT AND RIGHT INTERNAL MAMMARY ARTERIES;  Surgeon: Linden Dolin, MD;  Location: [facility] OR;  Service: Open Heart Surgery;  Laterality: N/A;   CORONARY STENT INTERVENTION N/A 03/20/2017    Procedure: DES x 3 LAD, DES OM; Surgeon: Kathleene Hazel, MD;  Location: [facility] INVASIVE CV LAB;  Service: Cardiovascular;  Laterality: N/A;   CORONARY/GRAFT ACUTE MI REVASCULARIZATION N/A 02/08/2018   Procedure: Coronary/Graft Acute MI Revascularization;  Surgeon: Lyn Records, MD;  Location: [facility] INVAS[facility] CV LAB;  Service: Cardiovascular;  Laterality: N/A;   ESOPHAGOGASTRODUODENOSCOPY (EGD) WITH PROPOFOL N/A 09/29/2021   Procedure: ESOPHAGOGASTRODUODENOSCOPY (EGD) WITH PROPOFOL;  Surgeon: Benancio Deeds, MD;  Location: [facility];  Service: Gastroenterology;  Laterality: N/A;   GAS INSERTION Right 04/28/2021   Procedure: INSERTION OF GAS ( C3F8);  Surgeon: Carmela Rima, MD;  Location: [facility] OR;  Service: Ophthalmology;  Laterality: Right;  Right eye   GAS/FLUID EXCHANGE Right 04/28/2021   Procedure: GAS/FLUID EXCHANGE;  Surgeon: Carmela Rima, MD;  Location: [facility] OR;  Service: Ophthalmology;  Laterality: Right;  Right eye   LASER PHOTO ABLATION Right 11/24/2021   Procedure: ENDO LASER PAN PHOTOCOAGULATION;  Surgeon: Carmela Rima, MD;  Location: [facility] OR;  Service: Ophthalmology;  Laterality: Right;   LASER PHOTO ABLATION Right 12/27/2021   Procedure: ENDO LASER;  Surgeon: Carmela Rima, MD;  Location: [facility] OR;  Service: Ophthalmology;  Laterality: Right;   LEFT HEART CATH AND CORONARY ANGIOGRAPHY N/A 03/20/2017   Procedure: LEFT HEART CATH AND CORONARY ANGIOGRAPHY;  Surgeon: Kathleene Hazel, MD;  Location: [facility];  Service: Cardiovascular;  Laterality: N/A;   LEFT HEART CATH AND CORONARY ANGIOGRAPHY N/A 02/08/2018   Procedure: LEFT HEART CATH AND CORONARY ANGIOGRAPHY;  Surgeon: Lyn Records, MD;  Location: [facility];  Service: Cardiovascular;  Laterality: N/A;   LEFT HEART CATH AND CORONARY ANGIOGRAPHY N/A 10/29/2018   Procedure: LEFT HEART CATH AND CORONARY ANGIOGRAPHY;  Surgeon: Kathleene Hazel, MD;  Location: [facility];  Service:  Cardiovascular;  Laterality: N/A;   LEFT HEART CATH AND CORS/GRAFTS ANGIOGRAPHY N/A 10/04/2020   Procedure: LEFT HEART CATH AND CORS/GRAFTS ANGIOGRAPHY;  Surgeon: Tonny Bollman, MD;  Location: [facility] INVASIVE CV LAB;  Service: Cardiovascular;  Laterality: N/A;   LEFT HEART CATH AND CORS/GRAFTS ANGIOGRAPHY N/A 05/11/2021   Procedure: LEFT HEART CATH AND CORS/GRAFTS ANGIOGRAPHY;  Surgeon: Swazil[place], Peter M, MD;  Location: [facility] INVASIVE CV LAB;  Service: Cardiovascular;  Laterality: N/A;   PARS PLANA VITRECTOMY Right 04/28/2021   Procedure: PARS PLANA VITRECTOMY WITH 25 GAUGE REMOVAL OF TRATIONAL MEMBRANE RIGHT EYE; DRAINAGE OF SUBRETINAL FLUID RIGHT EYE;  Surgeon: Carmela Rima, MD;  Location: [facility] OR;  Service: Ophthalmology;  Laterality: Right;  Right eye   PARS PLANA VITRECTOMY 27 GAUGE Right 12/27/2021   Procedure: PARS PLANA VITRECTOMY 27 GAUGE;  Surgeon: Carmela Rima, MD;  Location: [facility] OR;  Service: Ophthalmology;  Laterality: Right;   PHOTOCOAGULATION WITH LASER Right 04/28/2021   Procedure: PHOTOCOAGULATION WITH LASER;  Surgeon: Carmela Rima, MD;  Location: [facility] OR;  Service: Ophthalmology;  Laterality: Right;  Right eye   POLYPECTOMY  09/29/2021   Procedure: POLYPECTOMY;  Surgeon: Benancio Deeds, MD;  Location: [facility];  Service: Gastroenterology;;   TEE WITHOUT CARDIOVERSION N/A 11/04/2018   Procedure: TRANSESOPHAGEAL ECHOCARDIOGRAM (TEE);  Surgeon: Linden Dolin, MD;  Location: [facility] OR;  Service: Open Heart Surgery;  Laterality: N/A;   Social History   Socioeconomic History   Marital status: Divorced    Spouse name: Not on file   Number of children: 0   Years of education: Not on file   Highest education level: Professional school degree (e.g., MD, DDS, DVM, JD)  Occupational History   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 3 times weekly; reports decreasing use   Sexual activity: Not Currently    Birth control/protection: None  Other Topics Concern   Not  on file  Social History Narrative   Not on file   Social Determinants of Health   Financial Resource Strain: High Risk (10/05/2020)   Overall Financial Resource Strain (CARDIA)    Difficulty of Paying Living Expenses: Very hard  Food Insecurity: No Food Insecurity (01/14/2022)   Hunger Vital Sign    Worried About Running Out of Food in the Last Year: Never true    Ran Out of Food in the Last Year: Never true  Transportation Needs: No Transportation Needs (03/28/2022)   PRAPARE - Administrator, Civil Service (Medical): No    Lack of Transportation (Non-Medical): No  Physical Activity: Not on file  Stress: Not on file  Social Connections: Unknown (12/07/2017)   Social Connection and Isolation Panel [NHANES]    Frequency of Communication with Friends and Family: Patient declined    Frequency of Social Gatherings with Friends and Family: Patient declined    Attends Religious Services: Patient declined    Database administrator or Organizations: Patient declined    Attends Banker Meetings: Patient declined    Marital Status: Patient declined  Intimate Partner Violence: Not At Risk (01/14/2022)   Humiliation, Afraid, Rape, and Kick questionnaire    Fear of Current or Ex-Partner: No    Emotionally Abused: No    Physically Abused: No    Sexually Abused: No   Current Outpatient Medications on File Prior to Visit  Medication Sig Dispense Refill   acetaminophen (TYLENOL) 325 MG tablet Take 650 mg by mouth daily as needed for mild pain, fever or headache.      albuterol (VENTOLIN HFA) 108 (90 Base) MCG/ACT inhaler INHALE 2 PUFFS INTO THE LUNGS EVERY 6 (SIX) HOURS AS NEEDED FOR WHEEZING OR SHORTNESS OF BREATH. 18 g 2   Alirocumab (PRALUENT) 150 MG/ML SOAJ Inject 1 pen  into the skin every 14 (fourteen) days. 2 mL 11   aspirin 81 MG EC tablet Take 1 tablet (81 mg total) by mouth daily. (Patient taking differently: Take 81 mg by mouth at bedtime.) 90 tablet 0   b complex vitamins tablet Take 1 tablet by mouth 3 (three) times a week.     budesonide (PULMICORT) 0.25 MG/2ML nebulizer solution Take 0.25 mg by nebulization daily as needed (shortness of breath/wheezing).     buPROPion (WELLBUTRIN XL) 150 MG 24 hr tablet Take 1 tablet (150 mg total) by mouth every morning. 30 tablet 2   clopidogrel (PLAVIX) 75 MG tablet Take 1 tablet (75 mg total) by mouth daily with breakfast. (Patient taking differently: Take 75 mg by mouth at bedtime.) 90 tablet 2   Continuous Blood Gluc Sensor (DEXCOM G7 SENSOR) MISC Apply 1 sensor every 10 days 9 each 4   diclofenac Sodium (VOLTAREN) 1 % GEL Apply 2-4 g topically 3 (three) times daily as needed (knee pain).     diphenhydrAMINE (BENADRYL) 25 MG tablet Take 37.5 mg by mouth at bedtime.     DULoxetine (CYMBALTA) 30 MG capsule Take 1 capsule (30 mg total) by mouth at bedtime. 30 capsule 2   fluticasone (FLONASE) 50 MCG/ACT nasal spray Insert 1 spray each nostril daily for 3 days then use 2-3 times a week as needed 16 g 1   furosemide (LASIX) 20 MG tablet Take 1 tablet (20 mg total) by mouth daily as needed (for weight gain). 15 tablet 3   Glucagon 3 MG/DOSE POWD Place 3 mg into the nose once as needed for up to 1 dose.  1 each 11   insulin lispro (HUMALOG) 100 UNIT/ML injection Inject 0.35 mLs (35 Units total) into the skin daily via pump. 30 mL 1   levothyroxine (SYNTHROID) 100 MCG tablet Take 1 tablet (100 mcg total) by mouth daily before breakfast. 30 tablet 0   losartan (COZAAR) 25 MG tablet Take 1 tablet (25 mg total) by  mouth daily. 30 tablet 1   methylcellulose (CITRUCEL) oral powder Take 1 packet by mouth daily.     metoCLOPramide (REGLAN) 5 MG tablet Take 1 tablet (5 mg total) by mouth every 8 (eight) hours as needed for nausea or vomiting. 60 tablet 3   metoprolol succinate (TOPROL-XL) 25 MG 24 hr tablet Take 1 tablet (25 mg total) by mouth daily. 30 tablet 1   mometasone-formoterol (DULERA) 100-5 MCG/ACT AERO Inhale 2 puffs into the lungs 2 (two) times daily. 13 g 6   montelukast (SINGULAIR) 10 MG tablet Take 1 tablet (10 mg total) by mouth at bedtime. 90 tablet 2   Multiple Vitamin (MULTIVITAMIN WITH MINERALS) TABS tablet Take 1 tablet by mouth daily.     neomycin-polymyxin b-dexamethasone (MAXITROL) 3.5-10000-0.1 OINT Place 1 Application into both eyes 2 (two) times daily.     nitroGLYCERIN (NITROSTAT) 0.4 MG SL tablet Place 1 tablet (0.4 mg total) under the tongue every 5 (five) minutes as needed for chest pain. 25 tablet 6   omeprazole (PRILOSEC OTC) 20 MG tablet Take 20 mg by mouth daily as needed (acid reflux).     ondansetron (ZOFRAN) 4 MG tablet Take 1 tablet (4 mg total) by mouth daily as needed for nausea or vomiting. 30 tablet 1   OVER THE COUNTER MEDICATION Take 3 tablets by mouth See admin instructions. [employer] Super Lysine Immune Support- Take 3 tablets by mouth once a day on an empty stomach     pantoprazole (PROTONIX) 40 MG tablet Take 1 tablet (40 mg total) by mouth 2 (two) times daily. 60 tablet 2   No current facility-administered medications on file prior to visit.   Allergies  Allergen Reactions   Atorvastatin Other (See Comments)    Myalgias    Crestor [Rosuvastatin Calcium] Other (See Comments)    Severe myalgias and joint pain   Monosodium Glutamate Nausea And Vomiting and Other (See Comments)    MSG - migraine   Zetia [Ezetimibe] Other (See Comments)    myalgias   Ciprofloxin Hcl [Ciprofloxacin] Nausea And Vomiting and Other (See Comments)    Severe migraine   Food  Color Red [Red Dye] Diarrhea   Family History  Problem Relation Age of Onset   Cancer Mother        T cell lymphoma   Hypertension Mother    Hypercalcemia Mother    OCD Father    Anxiety disorder Father    Coronary artery disease Father    Congestive Heart Failure Father    Asthma Father    Hypercalcemia Father    Hypertension Father    Colon polyps Father    Depression Maternal Grandmother    Diabetes Paternal Grandmother    Stomach cancer Neg Hx    Esophageal cancer Neg Hx    Colon cancer Neg Hx    Rectal cancer Neg Hx    PE: There were no vitals taken for this visit. Wt Readings from Last 3 Encounters:  03/24/22 143 lb 3.2 oz (65 kg)  02/15/22 144 lb 12.8 oz (65.7 kg)  01/13/22 143 lb 1.3 oz (64.9 kg)   Constitutional: normal  weight, anxious appearing Eyes:  no exophthalmos ENT:  no masses palpated in neck, no cervical lymphadenopathy Cardiovascular: RRR, No MRG Respiratory: CTA B Musculoskeletal: no deformities Skin:  + vitiligo on arms and chest Neurological: + Mild tremor with outstretched hands Diabetic Foot Exam - Simple   Simple Foot Form Diabetic Foot exam was performed with the following findings: Yes 05/03/2022 10:55 AM  Visual Inspection No deformities, no ulcerations, no other skin breakdown bilaterally: Yes Sensation Testing Intact to touch and monofilament testing bilaterally: Yes Pulse Check Posterior Tibialis and Dorsalis pulse intact bilaterally: Yes Comments    ASSESSMENT: 1. DM1, uncontrolled, with complications - DKA - CAD, s/p AMI, s/p PTCA - DES; s/p NSTEMI 0418/2023 - iCMP - CHF - gastroparesis - ? (Recent gastric emptying study normal on 12/02/2021)  2. Hypothyroidism  PLAN:  1. Patient with longstanding, uncontrolled, type 1 diabetes.  Her control remains suboptimal, with the latest HbA1c still 8.0%, stable, above target.  She has been in DKA admissions for various reasons.  Since last visit, she was admitted in DKA twice, once 4  months ago, due to an unknown reason, but at that time she had chest pain and elevated troponins and a low EF, and once 1.5 months ago, due to colitis. -At last visit, we adjusted her pump settings and I also recommended to switch her pump system to the Dexcom and either OmniPod or t:slim x2 pump. -I initially identified the following problems in her diabetes management: She is not bolusing 15 minutes before meals, but many times, she boluses when the sugars are already up after a meal.  As a consequence, her sugars are dropping too low afterwards.  She does have gastroparesis and we discussed about trying to bolus 15 minutes before each meal, however, to move the boluses closer to the meal if needed depending on the blood sugars.  However, entering carbs and taking the bolus when the sugars already high after a meal is not conducive to good diabetes control Sugars are dropping overnight but this is most likely due to bolusing for dinner too late as mentioned above She is not entering all of the carbs into the pump.  We discussed about trying to enter blood sugars, enter carbs, and bolus before every meal. She changes her insulin in the reservoir and her infusion site less than every 7 days.  We discussed about inflammation at the site and inconsistent absorption if she does that.  She was trying to ration her supplies from Medtr[employer], but she is waiting for new supplies now.   She is not in the auto mode.  She states in the manual mode and she did not notice that.  I advised her to start the automatic mode She is stopping the pump for several hours a day.  We discussed that this is dangerous.  She mentions that many times when she sees a low blood sugar, especially during the night, she stops the pump and that she forgets to turn it on.  We discussed that she should not stop the pump, but correct the low blood sugar with scars and leave the pump going. -At last visit, she did not have the CGM as she did not  feel that this was accurate for her.  She was not checking sugars consistently, and did mostly manual boluses.  Sugars were uncontrolled, mostly elevated.  I recommended to bolus before every meal and doing the recommended boluses rather than the manual boluses and we discussed about switching  to the auto mode of the pump.  I also advised her to change the pump site every 3 to 4 days as she was changing it approximately every week.  We did not change the pump settings otherwise. CGM interpretation: -At today's visit, we reviewed her CGM downloads: It appears that 56% of values are in target range (goal >70%), while 36% are higher than 180 (goal <25%), and 8% are lower than 70 (goal <4%).  The calculated average blood sugar is 157.  The projected HbA1c for the next 3 months (GMI) is 7.1%. -Reviewing the CGM trends, sugars appear to be mostly fluctuating within the target range but they increase after the meals and decrease after dinner.  This pattern will likely improve after switching to the OmniPod 5, but I also made the following suggestions: Decreasing her basal rate from 10 PM to 6 AM and increasing the basal rate slightly from 10 AM to 10 PM.  We also strengthened her insulin to carb ratio.  For now, I did not adjust her sensitivity factor, but I did advise her that if the sugars stay high after the above changes, she may need to do so.   -I suggested to: Patient Instructions  Please use the following pump settings: - basal rates: 12 am: 0.550 >> 0.500 units/h 6 am: 0.625 10 am: 0.600 >> 0.625 10 pm: 0.650 >> 0.625 - ICR: 1:17 >> 1:15 - target: 100-120 - ISF: 75 (may need to change to 65-70) - Insulin on Board: 4h  Try to start the Omnipod 5.  Please do the following approximately 15 minutes before every meal: - Enter carbs (C) - Enter sugars (S) - Start insulin bolus (I)  Please continue Levothyroxine 100 mcg daily.  Take the thyroid hormone every day, with water, at least 30 minutes  before breakfast, separated by at least 4 hours from: - acid reflux medications - calcium - iron - multivitamins  Stop B complex 1 week before next visit.  Please return in 3-4 months.  - advised to check sugars at different times of the day - 4x a day, rotating check times - advised for yearly eye exams >> she is UTD - return to clinic in 3-4 months  2.  Hypothyroidism - latest thyroid labs reviewed with pt. >> normal: Lab Results  Component Value Date   TSH 2.505 05/15/2021  - she continues on LT4 100 mcg daily - pt feels good on this dose. - we discussed about taking the thyroid hormone every day, with water, >30 minutes before breakfast, separated by >4 hours from acid reflux medications, calcium, iron, multivitamins. Pt. is taking it correctly. - will check thyroid tests at next visit, since she is on a high dose of B complex, containing biotin.  I advised her to stop the biotin at least a week prior to her next appointment  Carlus Pavlov, MD PhD [facility] Endocrinology

## 2022-05-03 ENCOUNTER — Encounter: Payer: Self-pay | Admitting: Internal Medicine

## 2022-05-03 ENCOUNTER — Other Ambulatory Visit: Payer: Self-pay

## 2022-05-03 ENCOUNTER — Ambulatory Visit (INDEPENDENT_AMBULATORY_CARE_PROVIDER_SITE_OTHER): Payer: Medicaid Other | Admitting: Internal Medicine

## 2022-05-03 VITALS — BP 120/62 | Ht 65.0 in | Wt 144.6 lb

## 2022-05-03 DIAGNOSIS — E1059 Type 1 diabetes mellitus with other circulatory complications: Secondary | ICD-10-CM | POA: Diagnosis not present

## 2022-05-03 DIAGNOSIS — E039 Hypothyroidism, unspecified: Secondary | ICD-10-CM | POA: Diagnosis not present

## 2022-05-03 MED ORDER — OMNIPOD 5 DEXG7G6 INTRO GEN 5 KIT
1.0000 | PACK | 0 refills | Status: DC | PRN
  Filled 2022-05-03: qty 1, 30d supply, fill #0

## 2022-05-03 MED ORDER — OMNIPOD 5 DEXG7G6 PODS GEN 5 MISC
1.0000 | 3 refills | Status: DC
  Filled 2022-05-03: qty 30, 90d supply, fill #0
  Filled 2022-07-28: qty 30, 90d supply, fill #1

## 2022-05-03 NOTE — Patient Instructions (Addendum)
Please use the following pump settings: - basal rates: 12 am: 0.550 >> 0.500 units/h 6 am: 0.625 10 am: 0.600 >> 0.625 10 pm: 0.650 >> 0.625 - ICR: 1:17 >> 1:15 - target: 100-120 - ISF: 75 (may need to change to 65-70) - Insulin on Board: 4h  Try to start the Omnipod 5.  Please do the following approximately 15 minutes before every meal: - Enter carbs (C) - Enter sugars (S) - Start insulin bolus (I)  Please continue Levothyroxine 100 mcg daily.  Take the thyroid hormone every day, with water, at least 30 minutes before breakfast, separated by at least 4 hours from: - acid reflux medications - calcium - iron - multivitamins  Stop B complex 1 week before next visit. Please return in 3-4 months.

## 2022-05-04 ENCOUNTER — Ambulatory Visit: Payer: Medicaid Other | Admitting: Nurse Practitioner

## 2022-05-04 ENCOUNTER — Other Ambulatory Visit: Payer: Self-pay

## 2022-05-04 NOTE — Progress Notes (Deleted)
05/04/2022 Jennifer Hogan [id number] 19-Mar-1970   Chief Complaint:  History of Present Illness: Jennifer Hogan is a 52 year old female with a past medical history anxiety, depression, questionable asthma, coronary artery disease s/p MI 2019 s/p DES x 4  Feb. 2019, S/P DES x 23 Jan 2018 on Plavix, s/p 3 vessel CABG 10/2018, s/p multiple NSTEMIs as listed below, ischemic cardiomyopathy, hypercholesterolemia, hypothyroidism, DM type  on an insulin pump, numerous hospitalizations with N/V and DKA, suspected diabetic gastroparesis and GERD.   She was last seen in office by Dr. Adela Lank on 12/23/2021. At that time, her N/V symptoms were fairly well controlled. Taking Reglan 5mg  twice monthly.  She subsequently underwent a gastric empty study which was suprisingly normal.  Takes Regan 5mg  twice monthly  Repeat EGD at some point due to gastric intestinal metaplasia  ? Continue marijuana use  Her most recent hospital admission was 03/23/2022 - 03/24/2022 due to having abdominal pain diarrhea with bright red blood per the rectum and mild AKI and DKA. CTAP showed evidence of proctitis. She was treated with Levaquin and Flagyl for suspected infections diarrhea/proctitis.   CTAP with contrast 03/23/2022: FINDINGS: Lower chest: The lung bases are clear. The imaged heart is unremarkable.   Hepatobiliary: The liver and gallbladder are unremarkable. There is no biliary ductal dilatation.   Pancreas: Unremarkable.   Spleen: Unremarkable.   Adrenals/Urinary Tract: The adrenals are unremarkable.   The kidneys are unremarkable, with no focal lesion, stone, hydronephrosis, or hydroureter. The bladder is unremarkable. There is symmetric excretion of contrast into the collecting systems on the delayed images.   Stomach/Bowel: The stomach is unremarkable. There is no evidence of bowel obstruction. There is wall thickening in the rectum with mild surrounding fat stranding (2-64). And  possible mild wall thickening in the colon at the hepatic flexure and transverse colon, though the colon is underdistended at these levels. The appendix is surgically absent.   Vascular/Lymphatic: There is mild calcified plaque in the nonaneurysmal abdominal aorta. The major branch vessels are patent. The main portal and splenic veins are patent. There is no abdominopelvic lymphadenopathy.   Reproductive: The uterus and adnexa are unremarkable.   Other: There is no ascites or free air.   Musculoskeletal: There is advanced disc space narrowing and degenerative endplate change at L3-L4 and L4-L5. There is no acute osseous abnormality or suspicious osseous lesion.   IMPRESSION: 1. Wall thickening in the rectum with mild surrounding fat stranding suspicious for infectious or inflammatory proctitis, and possible mild wall thickening in the hepatic flexure and transverse colon, though the colon is underdistended at these levels. Recommend outpatient colonoscopy after resolution of acute symptoms to exclude underlying mass lesion. 2. No other acute findings in the abdomen or pelvis.    EGD 09/29/2021: - Esophagogastric landmarks identified.  - 1 cm hiatal hernia.  - Z-line irregular but did not meet criteria for Barrett's.  - A few gastric polyps. Representative sample resected and retrieved.  - Normal stomach otherwise - no outlet obstruction  - Normal examined duodenum. Biopsied. Will await biopsy results but suspect patient likely has underlying gastroparesis. Will discuss options with her following pathology results.  Colonoscopy 09/29/2021: - The examined portion of the ileum was normal.  - One 5 mm polyp in the descending colon, removed with a cold snare. Resected and retrieved.  - Internal hemorrhoids.  - The examination was otherwise normal.  - Biopsies were taken with a cold forceps from the right colon,  left colon and transverse colon for evaluation of microscopic  colitis. - Recall colonoscopy 7 years   A. SMALL BOWEL, BIOPSY:  -  Duodenal mucosa within normal limits   B. STOMACH, POLYPECTOMY:  -  Hyperplastic polyp, negative for dysplasia.   C. STOMACH, BIOPSY:  -  Predominantly antral type mucosa with mild to moderate chronic  inactive gastritis and multifocal intestinal metaplasia (3 of 4  fragments) suggestive of atrophy.  -  An immunohistochemical stain for Helicobacter pylori organisms was negative.   D. COLON, DESCENDING, POLYPECTOMY:  -  Tubular adenoma.   E. COLON, RANDOM, BIOPSY:  -  Colonic mucosa within normal limits.     Current Medications, Allergies, Past Medical History, Past Surgical History, Family History and Social History were reviewed in [employer] record.   Review of Systems:   Constitutional: Negative for fever, sweats, chills or weight loss.  Respiratory: Negative for shortness of breath.   Cardiovascular: Negative for chest pain, palpitations and leg swelling.  Gastrointestinal: See HPI.  Musculoskeletal: Negative for back pain or muscle aches.  Neurological: Negative for dizziness, headaches or paresthesias.    Physical Exam: There were no vitals taken for this visit. General: in no acute distress. Head: Normocephalic and atraumatic. Eyes: No scleral icterus. Conjunctiva pink . Ears: Normal auditory acuity. Mouth: Dentition intact. No ulcers or lesions.  Lungs: Clear throughout to auscultation. Heart: Regular rate and rhythm, no murmur. Abdomen: Soft, nontender and nondistended. No masses or hepatomegaly. Normal bowel sounds x 4 quadrants.  Rectal: *** Musculoskeletal: Symmetrical with no gross deformities. Extremities: No edema. Neurological: Alert oriented x 4. No focal deficits.  Psychological: Alert and cooperative. Normal mood and affect  Assessment and Recommendations:  52 year old female recently admitted to the hospital with N/V/D and bright red blood per the  rectum. Diagnosed with mild AKI, DKA and proctitis, likely infectious etiology. CTAP showed wall thickening in the rectum with mild surrounding fat stranding suspicious for infectious or inflammatory proctitis, and possible mild wall thickening in the hepatic flexure and transverse colon. Treated with Levaquin and Flagyl  Type I diabetes. On an insulin pump. Numerous hospital admissions for DKA. HgA1C 8.1% on 03/24/2022.  CAD s/p MI 2019 s/p DES x 4  Feb. 2019, S/P DES x 23 Jan 2018 on Plavix, s/p 3 vessel CABG 10/2018, s/p multiple NSTEMIs

## 2022-05-05 ENCOUNTER — Telehealth (HOSPITAL_COMMUNITY): Payer: Medicaid Other | Admitting: Psychiatry

## 2022-05-08 ENCOUNTER — Other Ambulatory Visit: Payer: Self-pay

## 2022-05-08 ENCOUNTER — Other Ambulatory Visit: Payer: Self-pay | Admitting: Internal Medicine

## 2022-05-08 MED ORDER — DEXCOM G6 SENSOR MISC
1.0000 | 3 refills | Status: AC
  Filled 2022-05-08 – 2022-05-24 (×3): qty 9, 90d supply, fill #0
  Filled 2022-05-29: qty 9, 84d supply, fill #0
  Filled 2022-06-02 (×2): qty 3, 30d supply, fill #0

## 2022-05-09 ENCOUNTER — Other Ambulatory Visit: Payer: Self-pay

## 2022-05-09 ENCOUNTER — Telehealth: Payer: Self-pay | Admitting: Nutrition

## 2022-05-09 NOTE — Telephone Encounter (Signed)
LVM to call me to schedule pump training 

## 2022-05-12 ENCOUNTER — Ambulatory Visit: Payer: Medicaid Other | Admitting: Nurse Practitioner

## 2022-05-15 ENCOUNTER — Telehealth: Payer: Self-pay | Admitting: Nutrition

## 2022-05-15 ENCOUNTER — Other Ambulatory Visit: Payer: Self-pay

## 2022-05-15 ENCOUNTER — Other Ambulatory Visit: Payer: Self-pay | Admitting: Family Medicine

## 2022-05-15 DIAGNOSIS — R0981 Nasal congestion: Secondary | ICD-10-CM

## 2022-05-15 NOTE — Telephone Encounter (Signed)
Patient reports that she does not have the G6 sensors as yet, but has the pump.  Appointment is tomorow to start this.  But, she started it this weekend, and reports having put all the settings in correctly, and says that blood sugars were dropping all day and night after starting this.  She has decreased her basal rate by 0.05u/hr, and this was not enough, so she decrease it to 0.1u/hr, except during the dawn phenomena from 3AM-7AM.  Says blood sugar was 120 this AM.  She is in the [place] mode, using her G7 sensors and has no questions for me.  She is wanting to cancel her appointment for tomorrow.   I explained the process of putting the transmitter number into the pdm, and the need to set up the new dexcom G6 app with the transmitter number into this as well.  She reported good understanding of this and had no final questions  for me.

## 2022-05-16 ENCOUNTER — Ambulatory Visit: Payer: Medicaid Other | Admitting: Nutrition

## 2022-05-16 ENCOUNTER — Other Ambulatory Visit: Payer: Self-pay

## 2022-05-16 MED ORDER — FLUTICASONE PROPIONATE 50 MCG/ACT NA SUSP
NASAL | 1 refills | Status: DC
Start: 2022-05-16 — End: 2022-11-09
  Filled 2022-05-16: qty 16, 60d supply, fill #0
  Filled 2022-07-10: qty 16, 60d supply, fill #1

## 2022-05-16 NOTE — Telephone Encounter (Signed)
Requested Prescriptions  Pending Prescriptions Disp Refills   fluticasone (FLONASE) 50 MCG/ACT nasal spray 16 g 1    Sig: Insert 1 spray each nostril daily for 3 days then use 2-3 times a week as needed     Ear, Nose, and Throat: Nasal Preparations - Corticosteroids Passed - 05/15/2022 10:38 AM      Passed - Valid encounter within last 12 months    Recent Outpatient Visits           3 months ago Hospital discharge follow-up   [facility] [employer], [place] W, NP   4 months ago Coronary artery disease involving native coronary artery of native heart without angina pectoris   [facility] Marcine Matar, MD   7 months ago Anxiety and depression   [facility] [place] [name], NP   10 months ago Hospital discharge follow-up   [facility] [name], NP   1 year ago Type 1 diabetes mellitus with other circulatory complication [facility])   [facility] [place], Shea Stakes, NP       Future Appointments             Tomorrow [name], NP [facility]

## 2022-05-17 ENCOUNTER — Encounter: Payer: Self-pay | Admitting: Nurse Practitioner

## 2022-05-17 ENCOUNTER — Other Ambulatory Visit: Payer: Self-pay

## 2022-05-17 ENCOUNTER — Ambulatory Visit: Payer: Medicaid Other | Attending: Nurse Practitioner | Admitting: Nurse Practitioner

## 2022-05-17 VITALS — BP 91/60 | HR 80 | Ht 62.0 in | Wt 141.8 lb

## 2022-05-17 DIAGNOSIS — Z23 Encounter for immunization: Secondary | ICD-10-CM

## 2022-05-17 DIAGNOSIS — M79644 Pain in right finger(s): Secondary | ICD-10-CM

## 2022-05-17 MED ORDER — MELOXICAM 15 MG PO TABS
15.0000 mg | ORAL_TABLET | Freq: Every day | ORAL | 1 refills | Status: DC
Start: 2022-05-17 — End: 2022-06-01
  Filled 2022-05-17: qty 30, 30d supply, fill #0

## 2022-05-17 NOTE — Progress Notes (Signed)
Assessment & Plan:  Jennifer Hogan was seen today for hand pain.  Diagnoses and all orders for this visit:  Pain of right thumb -     meloxicam (MOBIC) 15 MG tablet; Take 1 tablet (15 mg total) by mouth daily.  Need for shingles vaccine -     Varicella-zoster vaccine IM  Need for Tdap vaccination -     Tdap vaccine greater than or equal to 54yo IM    Patient has been counseled on age-appropriate routine health concerns for screening and prevention. These are reviewed and up-to-date. Referrals have been placed accordingly. Immunizations are up-to-date or declined.    Subjective:   Chief Complaint  Patient presents with   Hand Pain   HPI Jennifer Hogan 52 y.o. female presents to office today with complaints of right thumb pain.   She has a history of T1DM, anxiety, CAD s/p DES x3, depression, systolic CHF (EF 16-10%), GERD, HLD, hypothyroidism, gastroparesis    She has been experiencing a sharp pain along the inside of the right thumb and laterally radiating into the right radial area. Pain is described as sharp , aching and aggravated by rotational movement of the thumb. She denies any known injury or trauma but does endorse that she is still sleeping on the couch and sometimes uses her right hand to grasp the top of the couch to switch positions during sleep time. Onset of pain was 1 week ago. She states this is also the same side she had IV catheters placed when she was in the hospital last month. She has taken meloxicam in the past for plantar fasciitis and would like to try this again today for her thumb pain. There is no swelling or sign of gout today.  She has been wrapping the thumb, lower hand and forearm in ace wrap in an attempt to immobilize the thumb and prevent excessive movement.     Review of Systems  Constitutional:  Negative for fever, malaise/fatigue and weight loss.  HENT: Negative.  Negative for nosebleeds.   Eyes: Negative.  Negative for blurred vision, double  vision and photophobia.  Respiratory: Negative.  Negative for cough and shortness of breath.   Cardiovascular: Negative.  Negative for chest pain, palpitations and leg swelling.  Gastrointestinal: Negative.  Negative for heartburn, nausea and vomiting.  Musculoskeletal:  Positive for joint pain. Negative for myalgias.  Neurological: Negative.  Negative for dizziness, focal weakness, seizures and headaches.  Psychiatric/Behavioral: Negative.  Negative for suicidal ideas.     Past Medical History:  Diagnosis Date   Allergy    Anemia    Anxiety    Asthma    CAD S/P percutaneous coronary angioplasty 03/28/2017   Cataract    CHF (congestive heart failure)    Coronary artery disease    a. s/p PTCA DES x3 in the LAD (ostial DES placed, mid DES placed, distal DES placed) and PTCA/DES x1 to intermediate branch 02/2017. b. ACS 02/08/18 with LCx stent placement.   Depression    Diabetes mellitus    type 1   DKA (diabetic ketoacidoses) 12/31/2017   DKA (diabetic ketoacidosis) 01/13/2022   Dyspnea    with exertion and dust, no oxygen   Elevated platelet count    GERD (gastroesophageal reflux disease)    Heart murmur    never caused any problems   High cholesterol    Hypothyroidism    Ischemic cardiomyopathy    a. EF low-normal with basal inferior akinesis, basal septal hypokinesis by echo  01/2018.   Myocardial infarction 2019   Nausea and vomiting 12/04/2010   Non-ST elevation (NSTEMI) myocardial infarction 02/08/2018   NSTEMI (non-ST elevated myocardial infarction)    Substance abuse    marijuana    Past Surgical History:  Procedure Laterality Date   25 GAUGE PARS PLANA VITRECTOMY WITH 20 GAUGE MVR PORT Right 11/24/2021   Procedure: 25 GAUGE PARS PLANA VITRECTOMY APPROACH RIGHT EYE, FLUID FLUID EXCHANGE;  Surgeon: Carmela Rima, MD;  Location: [facility] OR;  Service: Ophthalmology;  Laterality: Right;   AIR/FLUID EXCHANGE Right 12/27/2021   Procedure: AIR/FLUID EXCHANGE;  Surgeon: Carmela Rima, MD;  Location: [facility] OR;  Service: Ophthalmology;  Laterality: Right;   APPENDECTOMY     BIOPSY  09/29/2021   Procedure: BIOPSY;  Surgeon: Benancio Deeds, MD;  Location: [facility];  Service: Gastroenterology;;   CATARACT EXTRACTION W/PHACO Right 11/24/2021   Procedure: CATARACT EXTRACTION AND INTRAOCULAR LENS PLACEMENT (IOC) RIGHT EYE;  Surgeon: Carmela Rima, MD;  Location: [facility] OR;  Service: Ophthalmology;  Laterality: Right;   COLONOSCOPY     COLONOSCOPY WITH PROPOFOL N/A 09/29/2021   Procedure: COLONOSCOPY WITH PROPOFOL;  Surgeon: Benancio Deeds, MD;  Location: [facility]COPY;  Service: Gastroenterology;  Laterality: N/A;   CORONARY ARTERY BYPASS GRAFT N/A 11/04/2018   Procedure: CORONARY ARTERY BYPASS GRAFTING (CABG), ON PUMP, TIMES THREE, USING LEFT AND RIGHT INTERNAL MAMMARY ARTERIES;  Surgeon: Linden Dolin, MD;  Location: [facility] OR;  Service: Open Heart Surgery;  Laterality: N/A;   CORONARY STENT INTERVENTION N/A 03/20/2017   Procedure: DES x 3 LAD, DES OM; Surgeon: Kathleene Hazel, MD;  Location: [facility] INVASIVE CV LAB;  Service: Cardiovascular;  Laterality: N/A;   CORONARY/GRAFT ACUTE MI REVASCULARIZATION N/A 02/08/2018   Procedure: Coronary/Graft Acute MI Revascularization;  Surgeon: Lyn Records, MD;  Location: [facility] INVASIVE CV LAB;  Service: Cardiovascular;  Laterality: N/A;   ESOPHAGOGASTRODUODENOSCOPY (EGD) WITH PROPOFOL N/A 09/29/2021   Procedure: ESOPHAGOGASTRODUODENOSCOPY (EGD) WITH PROPOFOL;  Surgeon: Benancio Deeds, MD;  Location: [facility];  Service: Gastroenterology;  Laterality: N/A;   GAS INSERTION Right 04/28/2021   Procedure: INSERTION OF GAS ( C3F8);  Surgeon: Carmela Rima, MD;  Location: [facility] OR;  Service: Ophthalmology;  Laterality: Right;  Right eye   GAS/FLUID EXCHANGE Right 04/28/2021   Procedure: GAS/FLUID EXCHANGE;  Surgeon: Carmela Rima, MD;  Location: [facility] OR;  Service: Ophthalmology;  Laterality: Right;  Right eye   LASER PHOTO  ABLATION Right 11/24/2021   Procedure: ENDO LASER PAN PHOTOCOAGULATION;  Surgeon: Carmela Rima, MD;  Location: [facility] OR;  Service: Ophthalmology;  Laterality: Right;   LASER PHOTO ABLATION Right 12/27/2021   Procedure: ENDO LASER;  Surgeon: Carmela Rima, MD;  Location: [facility] OR;  Service: Ophthalmology;  Laterality: Right;   LEFT HEART CATH AND CORONARY ANGIOGRAPHY N/A 03/20/2017   Procedure: LEFT HEART CATH AND CORONARY ANGIOGRAPHY;  Surgeon: Kathleene Hazel, MD;  Location: [facility] CV LAB;  Service: Cardiovascular;  Laterality: N/A;   LEFT HEART CATH AND CORONARY ANGIOGRAPHY N/A 02/08/2018   Procedure: LEFT HEART CATH AND CORONARY ANGIOGRAPHY;  Surgeon: Lyn Records, MD;  Location: [facility];  Service: Cardiovascular;  Laterality: N/A;   LEFT HEART CATH AND CORONARY ANGIOGRAPHY N/A 10/29/2018   Procedure: LEFT HEART CATH AND CORONARY ANGIOGRAPHY;  Surgeon: Kathleene Hazel, MD;  Location: [facility] CV LAB;  Service: Cardiovascular;  Laterality: N/A;   LEFT HEART CATH AND CORS/GRAFTS ANGIOGRAPHY N/A 10/04/2020   Procedure: LEFT HEART CATH AND CORS/GRAFTS  ANGIOGRAPHY;  Surgeon: Tonny Bollman, MD;  Location: [facility] INVASIVE CV LAB;  Service: Cardiovascular;  Laterality: N/A;   LEFT HEART CATH AND CORS/GRAFTS ANGIOGRAPHY N/A 05/11/2021   Procedure: LEFT HEART CATH AND CORS/GRAFTS ANGIOGRAPHY;  Surgeon: Swazil[place], Peter M, MD;  Location: [facility] INVASIVE CV LAB;  Service: Cardiovascular;  Laterality: N/A;   PARS PLANA VITRECTOMY Right 04/28/2021   Procedure: PARS PLANA VITRECTOMY WITH 25 GAUGE REMOVAL OF TRATIONAL MEMBRANE RIGHT EYE; DRAINAGE OF SUBRETINAL FLUID RIGHT EYE;  Surgeon: Carmela Rima, MD;  Location: [facility] OR;  Service: Ophthalmology;  Laterality: Right;  Right eye   PARS PLANA VITRECTOMY 27 GAUGE Right 12/27/2021   Procedure: PARS PLANA VITRECTOMY 27 GAUGE;  Surgeon: Carmela Rima, MD;  Location: [facility] OR;  Service: Ophthalmology;  Laterality: Right;   PHOTOCOAGULATION  WITH LASER Right 04/28/2021   Procedure: PHOTOCOAGULATION WITH LASER;  Surgeon: Carmela Rima, MD;  Location: [facility] OR;  Service: Ophthalmology;  Laterality: Right;  Right eye   POLYPECTOMY  09/29/2021   Procedure: POLYPECTOMY;  Surgeon: Benancio Deeds, MD;  Location: [facility];  Service: Gastroenterology;;   TEE WITHOUT CARDIOVERSION N/A 11/04/2018   Procedure: TRANSESOPHAGEAL ECHOCARDIOGRAM (TEE);  Surgeon: Linden Dolin, MD;  Location: [facility] OR;  Service: Open Heart Surgery;  Laterality: N/A;    Family History  Problem Relation Age of Onset   Cancer Mother        T cell lymphoma   Hypertension Mother    Hypercalcemia Mother    OCD Father    Anxiety disorder Father    Coronary artery disease Father    Congestive Heart Failure Father    Asthma Father    Hypercalcemia Father    Hypertension Father    Colon polyps Father    Depression Maternal Grandmother    Diabetes Paternal Grandmother    Stomach cancer Neg Hx    Esophageal cancer Neg Hx    Colon cancer Neg Hx    Rectal cancer Neg Hx     Social History Reviewed with no changes to be made today.   Outpatient Medications Prior to Visit  Medication Sig Dispense Refill   acetaminophen (TYLENOL) 325 MG tablet Take 650 mg by mouth daily as needed for mild pain, fever or headache.     albuterol (VENTOLIN HFA) 108 (90 Base) MCG/ACT inhaler INHALE 2 PUFFS INTO THE LUNGS EVERY 6 (SIX) HOURS AS NEEDED FOR WHEEZING OR SHORTNESS OF BREATH. 18 g 2   Alirocumab (PRALUENT) 150 MG/ML SOAJ Inject 1 pen  into the skin every 14 (fourteen) days. 2 mL 11   aspirin 81 MG EC tablet Take 1 tablet (81 mg total) by mouth daily. (Patient taking differently: Take 81 mg by mouth at bedtime.) 90 tablet 0   b complex vitamins tablet Take 1 tablet by mouth 3 (three) times a week.     budesonide (PULMICORT) 0.25 MG/2ML nebulizer solution Take 0.25 mg by nebulization daily as needed (shortness of breath/wheezing).     buPROPion (WELLBUTRIN XL) 150 MG 24  hr tablet Take 1 tablet (150 mg total) by mouth every morning. 30 tablet 2   clopidogrel (PLAVIX) 75 MG tablet Take 1 tablet (75 mg total) by mouth daily with breakfast. (Patient taking differently: Take 75 mg by mouth at bedtime.) 90 tablet 2   Continuous Blood Gluc Sensor (DEXCOM G7 SENSOR) MISC Apply 1 sensor every 10 days 9 each 4   Continuous Glucose Sensor (DEXCOM G6 SENSOR) MISC Inject 1 Device into the skin continuous for 10 days.  9 each 3   diclofenac Sodium (VOLTAREN) 1 % GEL Apply 2-4 g topically 3 (three) times daily as needed (knee pain).     diphenhydrAMINE (BENADRYL) 25 MG tablet Take 37.5 mg by mouth at bedtime.     DULoxetine (CYMBALTA) 30 MG capsule Take 1 capsule (30 mg total) by mouth at bedtime. 30 capsule 2   fluticasone (FLONASE) 50 MCG/ACT nasal spray Insert 1 spray each nostril daily for 3 days then use 2-3 times a week as needed 16 g 1   furosemide (LASIX) 20 MG tablet Take 1 tablet (20 mg total) by mouth daily as needed (for weight gain). 15 tablet 3   Glucagon 3 MG/DOSE POWD Place 3 mg into the nose once as needed for up to 1 dose. 1 each 11   Insulin Disposable Pump (OMNIPOD 5 G6 INTRO, GEN 5,) KIT Use as directed. 1 kit 0   Insulin Disposable Pump (OMNIPOD 5 G6 PODS, GEN 5,) MISC Use every 3 (three) days. 30 each 3   insulin lispro (HUMALOG) 100 UNIT/ML injection Inject 0.35 mLs (35 Units total) into the skin daily via pump. 30 mL 1   levothyroxine (SYNTHROID) 100 MCG tablet Take 1 tablet (100 mcg total) by mouth daily before breakfast. 30 tablet 0   losartan (COZAAR) 25 MG tablet Take 1 tablet (25 mg total) by mouth daily. 30 tablet 1   methylcellulose (CITRUCEL) oral powder Take 1 packet by mouth daily.     metoCLOPramide (REGLAN) 5 MG tablet Take 1 tablet (5 mg total) by mouth every 8 (eight) hours as needed for nausea or vomiting. 60 tablet 3   metoprolol succinate (TOPROL-XL) 25 MG 24 hr tablet Take 1 tablet (25 mg total) by mouth daily. 30 tablet 1    mometasone-formoterol (DULERA) 100-5 MCG/ACT AERO Inhale 2 puffs into the lungs 2 (two) times daily. 13 g 6   montelukast (SINGULAIR) 10 MG tablet Take 1 tablet (10 mg total) by mouth at bedtime. 90 tablet 2   Multiple Vitamin (MULTIVITAMIN WITH MINERALS) TABS tablet Take 1 tablet by mouth daily.     neomycin-polymyxin b-dexamethasone (MAXITROL) 3.5-10000-0.1 OINT Place 1 Application into both eyes 2 (two) times daily.     nitroGLYCERIN (NITROSTAT) 0.4 MG SL tablet Place 1 tablet (0.4 mg total) under the tongue every 5 (five) minutes as needed for chest pain. 25 tablet 6   omeprazole (PRILOSEC OTC) 20 MG tablet Take 20 mg by mouth daily as needed (acid reflux).     ondansetron (ZOFRAN) 4 MG tablet Take 1 tablet (4 mg total) by mouth daily as needed for nausea or vomiting. 30 tablet 1   OVER THE COUNTER MEDICATION Take 3 tablets by mouth See admin instructions. [employer] Super Lysine Immune Support- Take 3 tablets by mouth once a day on an empty stomach     pantoprazole (PROTONIX) 40 MG tablet Take 1 tablet (40 mg total) by mouth 2 (two) times daily. 60 tablet 2   No facility-administered medications prior to visit.    Allergies  Allergen Reactions   Atorvastatin Other (See Comments)    Myalgias    Crestor [Rosuvastatin Calcium] Other (See Comments)    Severe myalgias and joint pain   Monosodium Glutamate Nausea And Vomiting and Other (See Comments)    MSG - migraine   Zetia [Ezetimibe] Other (See Comments)    myalgias   Ciprofloxin Hcl [Ciprofloxacin] Nausea And Vomiting and Other (See Comments)    Severe migraine   Food Color Red [  Red Dye] Diarrhea       Objective:    BP 91/60 (BP Location: Left Arm, Patient Position: Sitting, Cuff Size: Small)   Pulse 80   Ht 5\' 2"  (1.575 m)   Wt 141 lb 12.8 oz (64.3 kg)   LMP 04/21/2022 (Exact Date)   SpO2 100%   BMI 25.94 kg/m  Wt Readings from Last 3 Encounters:  05/17/22 141 lb 12.8 oz (64.3 kg)  05/03/22 144 lb 9.6 oz (65.6 kg)   03/24/22 143 lb 3.2 oz (65 kg)    Physical Exam Vitals and nursing note reviewed.  Constitutional:      Appearance: She is well-developed.  HENT:     Head: Normocephalic and atraumatic.  Cardiovascular:     Rate and Rhythm: Normal rate and regular rhythm.     Heart sounds: Normal heart sounds. No murmur heard.    No friction rub. No gallop.  Pulmonary:     Effort: Pulmonary effort is normal. No tachypnea or respiratory distress.     Breath sounds: Normal breath sounds. No decreased breath sounds, wheezing, rhonchi or rales.  Chest:     Chest wall: No tenderness.  Abdominal:     General: Bowel sounds are normal.     Palpations: Abdomen is soft.  Musculoskeletal:     Right hand: Tenderness present. No swelling or deformity. Decreased range of motion.     Cervical back: Normal range of motion.  Skin:    General: Skin is warm and dry.  Neurological:     Mental Status: She is alert and oriented to person, place, and time.     Coordination: Coordination normal.  Psychiatric:        Behavior: Behavior normal. Behavior is cooperative.        Thought Content: Thought content normal.        Judgment: Judgment normal.          Patient has been counseled extensively about nutrition and exercise as well as the importance of adherence with medications and regular follow-up. The patient was given clear instructions to go to ER or return to medical center if symptoms don't improve, worsen or new problems develop. The patient verbalized understanding.   Follow-up: Return in about 3 months (around 08/21/2022).   [name], FNP-BC [facility] [place], [place] [phone]   05/17/2022, 1:27 PM

## 2022-05-17 NOTE — Telephone Encounter (Signed)
Patient came in to office today and picked up sample of Dexcom G6 Transmitter and Dexcom G6 Sensor.

## 2022-05-18 ENCOUNTER — Other Ambulatory Visit: Payer: Self-pay

## 2022-05-19 ENCOUNTER — Other Ambulatory Visit: Payer: Self-pay

## 2022-05-20 ENCOUNTER — Emergency Department (HOSPITAL_BASED_OUTPATIENT_CLINIC_OR_DEPARTMENT_OTHER): Payer: Medicare Other

## 2022-05-20 ENCOUNTER — Emergency Department (HOSPITAL_BASED_OUTPATIENT_CLINIC_OR_DEPARTMENT_OTHER): Payer: Medicare Other | Admitting: Radiology

## 2022-05-20 ENCOUNTER — Inpatient Hospital Stay (HOSPITAL_BASED_OUTPATIENT_CLINIC_OR_DEPARTMENT_OTHER)
Admission: EM | Admit: 2022-05-20 | Discharge: 2022-06-01 | DRG: 280 | Disposition: A | Discharge status: Home / Self Care | Payer: Medicare Other | Attending: Cardiology | Admitting: Cardiology

## 2022-05-20 ENCOUNTER — Encounter (HOSPITAL_BASED_OUTPATIENT_CLINIC_OR_DEPARTMENT_OTHER): Payer: Self-pay

## 2022-05-20 DIAGNOSIS — Z833 Family history of diabetes mellitus: Secondary | ICD-10-CM

## 2022-05-20 DIAGNOSIS — I2510 Atherosclerotic heart disease of native coronary artery without angina pectoris: Secondary | ICD-10-CM | POA: Diagnosis present

## 2022-05-20 DIAGNOSIS — E1110 Type 2 diabetes mellitus with ketoacidosis without coma: Secondary | ICD-10-CM | POA: Diagnosis present

## 2022-05-20 DIAGNOSIS — N179 Acute kidney failure, unspecified: Secondary | ICD-10-CM | POA: Diagnosis present

## 2022-05-20 DIAGNOSIS — Z7902 Long term (current) use of antithrombotics/antiplatelets: Secondary | ICD-10-CM

## 2022-05-20 DIAGNOSIS — Z83719 Family history of colon polyps, unspecified: Secondary | ICD-10-CM

## 2022-05-20 DIAGNOSIS — I5021 Acute systolic (congestive) heart failure: Secondary | ICD-10-CM | POA: Diagnosis present

## 2022-05-20 DIAGNOSIS — J9601 Acute respiratory failure with hypoxia: Secondary | ICD-10-CM | POA: Diagnosis present

## 2022-05-20 DIAGNOSIS — Z791 Long term (current) use of non-steroidal anti-inflammatories (NSAID): Secondary | ICD-10-CM

## 2022-05-20 DIAGNOSIS — Z794 Long term (current) use of insulin: Secondary | ICD-10-CM

## 2022-05-20 DIAGNOSIS — Z87891 Personal history of nicotine dependence: Secondary | ICD-10-CM

## 2022-05-20 DIAGNOSIS — I214 Non-ST elevation (NSTEMI) myocardial infarction: Secondary | ICD-10-CM | POA: Diagnosis not present

## 2022-05-20 DIAGNOSIS — E039 Hypothyroidism, unspecified: Secondary | ICD-10-CM | POA: Diagnosis present

## 2022-05-20 DIAGNOSIS — Z5941 Food insecurity: Secondary | ICD-10-CM

## 2022-05-20 DIAGNOSIS — Z881 Allergy status to other antibiotic agents status: Secondary | ICD-10-CM

## 2022-05-20 DIAGNOSIS — E7800 Pure hypercholesterolemia, unspecified: Secondary | ICD-10-CM | POA: Diagnosis present

## 2022-05-20 DIAGNOSIS — F419 Anxiety disorder, unspecified: Secondary | ICD-10-CM | POA: Diagnosis present

## 2022-05-20 DIAGNOSIS — I255 Ischemic cardiomyopathy: Secondary | ICD-10-CM | POA: Diagnosis present

## 2022-05-20 DIAGNOSIS — E876 Hypokalemia: Secondary | ICD-10-CM | POA: Diagnosis present

## 2022-05-20 DIAGNOSIS — E1010 Type 1 diabetes mellitus with ketoacidosis without coma: Secondary | ICD-10-CM

## 2022-05-20 DIAGNOSIS — Z5982 Transportation insecurity: Secondary | ICD-10-CM

## 2022-05-20 DIAGNOSIS — Z807 Family history of other malignant neoplasms of lymphoid, hematopoietic and related tissues: Secondary | ICD-10-CM

## 2022-05-20 DIAGNOSIS — E440 Moderate protein-calorie malnutrition: Secondary | ICD-10-CM | POA: Diagnosis present

## 2022-05-20 DIAGNOSIS — I110 Hypertensive heart disease with heart failure: Secondary | ICD-10-CM | POA: Diagnosis present

## 2022-05-20 DIAGNOSIS — J45909 Unspecified asthma, uncomplicated: Secondary | ICD-10-CM | POA: Diagnosis present

## 2022-05-20 DIAGNOSIS — Z79890 Hormone replacement therapy (postmenopausal): Secondary | ICD-10-CM

## 2022-05-20 DIAGNOSIS — Z79899 Other long term (current) drug therapy: Secondary | ICD-10-CM

## 2022-05-20 DIAGNOSIS — I82C12 Acute embolism and thrombosis of left internal jugular vein: Secondary | ICD-10-CM | POA: Diagnosis present

## 2022-05-20 DIAGNOSIS — Z7982 Long term (current) use of aspirin: Secondary | ICD-10-CM

## 2022-05-20 DIAGNOSIS — D649 Anemia, unspecified: Secondary | ICD-10-CM | POA: Diagnosis present

## 2022-05-20 DIAGNOSIS — I5084 End stage heart failure: Secondary | ICD-10-CM | POA: Diagnosis present

## 2022-05-20 DIAGNOSIS — Z951 Presence of aortocoronary bypass graft: Secondary | ICD-10-CM

## 2022-05-20 DIAGNOSIS — K219 Gastro-esophageal reflux disease without esophagitis: Secondary | ICD-10-CM | POA: Diagnosis present

## 2022-05-20 DIAGNOSIS — Z6825 Body mass index (BMI) 25.0-25.9, adult: Secondary | ICD-10-CM

## 2022-05-20 DIAGNOSIS — I252 Old myocardial infarction: Secondary | ICD-10-CM

## 2022-05-20 DIAGNOSIS — Z818 Family history of other mental and behavioral disorders: Secondary | ICD-10-CM

## 2022-05-20 DIAGNOSIS — Z5986 Financial insecurity: Secondary | ICD-10-CM

## 2022-05-20 DIAGNOSIS — Z7951 Long term (current) use of inhaled steroids: Secondary | ICD-10-CM

## 2022-05-20 DIAGNOSIS — Z888 Allergy status to other drugs, medicaments and biological substances status: Secondary | ICD-10-CM

## 2022-05-20 DIAGNOSIS — Z9641 Presence of insulin pump (external) (internal): Secondary | ICD-10-CM | POA: Diagnosis present

## 2022-05-20 DIAGNOSIS — E1043 Type 1 diabetes mellitus with diabetic autonomic (poly)neuropathy: Secondary | ICD-10-CM | POA: Diagnosis present

## 2022-05-20 DIAGNOSIS — E860 Dehydration: Secondary | ICD-10-CM | POA: Diagnosis present

## 2022-05-20 DIAGNOSIS — Z8249 Family history of ischemic heart disease and other diseases of the circulatory system: Secondary | ICD-10-CM

## 2022-05-20 DIAGNOSIS — F32A Depression, unspecified: Secondary | ICD-10-CM | POA: Diagnosis present

## 2022-05-20 DIAGNOSIS — I493 Ventricular premature depolarization: Secondary | ICD-10-CM | POA: Diagnosis present

## 2022-05-20 DIAGNOSIS — R570 Cardiogenic shock: Secondary | ICD-10-CM | POA: Diagnosis present

## 2022-05-20 DIAGNOSIS — Z9102 Food additives allergy status: Secondary | ICD-10-CM

## 2022-05-20 DIAGNOSIS — I5023 Acute on chronic systolic (congestive) heart failure: Secondary | ICD-10-CM | POA: Diagnosis present

## 2022-05-20 DIAGNOSIS — I420 Dilated cardiomyopathy: Secondary | ICD-10-CM | POA: Diagnosis present

## 2022-05-20 DIAGNOSIS — I25118 Atherosclerotic heart disease of native coronary artery with other forms of angina pectoris: Secondary | ICD-10-CM | POA: Diagnosis present

## 2022-05-20 DIAGNOSIS — K3184 Gastroparesis: Secondary | ICD-10-CM | POA: Diagnosis present

## 2022-05-20 DIAGNOSIS — Z825 Family history of asthma and other chronic lower respiratory diseases: Secondary | ICD-10-CM

## 2022-05-20 LAB — COMPREHENSIVE METABOLIC PANEL
ALT: 19 U/L (ref 0–44)
AST: 28 U/L (ref 15–41)
Albumin: 4.5 g/dL (ref 3.5–5.0)
Alkaline Phosphatase: 84 U/L (ref 38–126)
Anion gap: 19 — ABNORMAL HIGH (ref 5–15)
BUN: 22 mg/dL — ABNORMAL HIGH (ref 6–20)
CO2: 17 mmol/L — ABNORMAL LOW (ref 22–32)
Calcium: 9.7 mg/dL (ref 8.9–10.3)
Chloride: 100 mmol/L (ref 98–111)
Creatinine, Ser: 1.02 mg/dL — ABNORMAL HIGH (ref 0.44–1.00)
GFR, Estimated: >60 mL/min (ref >60)
Glucose, Bld: 331 mg/dL — ABNORMAL HIGH (ref 70–99)
Potassium: 3.7 mmol/L (ref 3.5–5.1)
Sodium: 136 mmol/L (ref 135–145)
Total Bilirubin: 0.7 mg/dL (ref 0.3–1.2)
Total Protein: 8 g/dL (ref 6.5–8.1)

## 2022-05-20 LAB — PREGNANCY, URINE: Preg Test, Ur: NEGATIVE

## 2022-05-20 LAB — I-STAT VENOUS BLOOD GAS, ED
Acid-base deficit: 7 mmol/L — ABNORMAL HIGH (ref 0.0–2.0)
Bicarbonate: 17.7 mmol/L — ABNORMAL LOW (ref 20.0–28.0)
Calcium, Ion: 1.23 mmol/L (ref 1.15–1.40)
HCT: 38 % (ref 36.0–46.0)
Hemoglobin: 12.9 g/dL (ref 12.0–15.0)
O2 Saturation: 66 %
Patient temperature: 98.7
Potassium: 3.7 mmol/L (ref 3.5–5.1)
Sodium: 136 mmol/L (ref 135–145)
TCO2: 19 mmol/L — ABNORMAL LOW (ref 22–32)
pCO2, Ven: 32.9 mmHg — ABNORMAL LOW (ref 44–60)
pH, Ven: 7.34 (ref 7.25–7.43)
pO2, Ven: 36 mmHg (ref 32–45)

## 2022-05-20 LAB — CBC WITH DIFFERENTIAL/PLATELET
Abs Immature Granulocytes: 0.05 10*3/uL (ref 0.00–0.07)
Basophils Absolute: 0 10*3/uL (ref 0.0–0.1)
Basophils Relative: 0 %
Eosinophils Absolute: 0 10*3/uL (ref 0.0–0.5)
Eosinophils Relative: 0 %
HCT: 35.8 % — ABNORMAL LOW (ref 36.0–46.0)
Hemoglobin: 12 g/dL (ref 12.0–15.0)
Immature Granulocytes: 0 %
Lymphocytes Relative: 4 %
Lymphs Abs: 0.5 10*3/uL — ABNORMAL LOW (ref 0.7–4.0)
MCH: 29.8 pg (ref 26.0–34.0)
MCHC: 33.5 g/dL (ref 30.0–36.0)
MCV: 88.8 fL (ref 80.0–100.0)
Monocytes Absolute: 0.6 10*3/uL (ref 0.1–1.0)
Monocytes Relative: 4 %
Neutro Abs: 12.4 10*3/uL — ABNORMAL HIGH (ref 1.7–7.7)
Neutrophils Relative %: 92 %
Platelets: 477 10*3/uL — ABNORMAL HIGH (ref 150–400)
RBC: 4.03 MIL/uL (ref 3.87–5.11)
RDW: 13.7 % (ref 11.5–15.5)
WBC: 13.6 10*3/uL — ABNORMAL HIGH (ref 4.0–10.5)
nRBC: 0 % (ref 0.0–0.2)

## 2022-05-20 LAB — BASIC METABOLIC PANEL
Anion gap: 18 — ABNORMAL HIGH (ref 5–15)
BUN: 20 mg/dL (ref 6–20)
CO2: 16 mmol/L — ABNORMAL LOW (ref 22–32)
Calcium: 9.5 mg/dL (ref 8.9–10.3)
Chloride: 101 mmol/L (ref 98–111)
Creatinine, Ser: 0.95 mg/dL (ref 0.44–1.00)
GFR, Estimated: >60 mL/min (ref >60)
Glucose, Bld: 219 mg/dL — ABNORMAL HIGH (ref 70–99)
Potassium: 4 mmol/L (ref 3.5–5.1)
Sodium: 135 mmol/L (ref 135–145)

## 2022-05-20 LAB — URINALYSIS, ROUTINE W REFLEX MICROSCOPIC
Bacteria, UA: NONE SEEN
Bilirubin Urine: NEGATIVE
Glucose, UA: >1000 mg/dL — AB
Hgb urine dipstick: NEGATIVE
Ketones, ur: >80 mg/dL — AB
Nitrite: NEGATIVE
Specific Gravity, Urine: 1.023 (ref 1.005–1.030)
pH: 5.5 (ref 5.0–8.0)

## 2022-05-20 LAB — CBG MONITORING, ED
Glucose-Capillary: 344 mg/dL — ABNORMAL HIGH (ref 70–99)
Glucose-Capillary: 208 mg/dL — ABNORMAL HIGH (ref 70–99)

## 2022-05-20 LAB — LIPASE, BLOOD: Lipase: <10 U/L — ABNORMAL LOW (ref 11–51)

## 2022-05-20 MED ORDER — LACTATED RINGERS IV BOLUS
20.0000 mL/kg | Freq: Once | INTRAVENOUS | Status: AC
  Administered 2022-05-20: 643 mL via INTRAVENOUS

## 2022-05-20 MED ORDER — DEXTROSE IN LACTATED RINGERS 5 % IV SOLN: INTRAVENOUS | Status: DC

## 2022-05-20 MED ORDER — HYDROMORPHONE HCL 1 MG/ML IJ SOLN
1.0000 mg | Freq: Once | INTRAMUSCULAR | Status: AC
  Administered 2022-05-20: 1 mg via INTRAVENOUS
  Filled 2022-05-20: qty 1

## 2022-05-20 MED ORDER — ONDANSETRON HCL 4 MG/2ML IJ SOLN
4.0000 mg | Freq: Once | INTRAMUSCULAR | Status: AC
  Administered 2022-05-20: 4 mg via INTRAVENOUS
  Filled 2022-05-20: qty 2

## 2022-05-20 MED ORDER — METOCLOPRAMIDE HCL 5 MG/ML IJ SOLN
5.0000 mg | Freq: Once | INTRAMUSCULAR | Status: AC
  Administered 2022-05-20: 5 mg via INTRAVENOUS
  Filled 2022-05-20: qty 2

## 2022-05-20 MED ORDER — DEXTROSE 50 % IV SOLN: 0.0000 mL | INTRAVENOUS | Status: DC | PRN

## 2022-05-20 MED ORDER — POTASSIUM CHLORIDE 10 MEQ/100ML IV SOLN
10.0000 meq | INTRAVENOUS | Status: AC
  Administered 2022-05-20 – 2022-05-21 (×2): 10 meq via INTRAVENOUS
  Filled 2022-05-20 (×2): qty 100

## 2022-05-20 MED ORDER — IOHEXOL 300 MG/ML  SOLN
100.0000 mL | Freq: Once | INTRAMUSCULAR | Status: AC | PRN
  Administered 2022-05-20: 100 mL via INTRAVENOUS

## 2022-05-20 MED ORDER — MORPHINE SULFATE (PF) 4 MG/ML IV SOLN
4.0000 mg | Freq: Once | INTRAVENOUS | Status: AC
  Administered 2022-05-20: 4 mg via INTRAVENOUS
  Filled 2022-05-20: qty 1

## 2022-05-20 MED ORDER — LACTATED RINGERS IV SOLN: INTRAVENOUS | Status: DC

## 2022-05-20 MED ORDER — SODIUM CHLORIDE 0.9 % IV BOLUS
1000.0000 mL | Freq: Once | INTRAVENOUS | Status: AC
  Administered 2022-05-20: 1000 mL via INTRAVENOUS

## 2022-05-20 MED ORDER — INSULIN REGULAR(HUMAN) IN NACL 100-0.9 UT/100ML-% IV SOLN
INTRAVENOUS | Status: AC
  Administered 2022-05-21: 6.5 [IU]/h via INTRAVENOUS
  Filled 2022-05-20 (×2): qty 100

## 2022-05-20 NOTE — ED Notes (Signed)
Pt spo2 78% on RA, repositioned and placed on nrb 15L

## 2022-05-20 NOTE — ED Notes (Signed)
Patient placing finger in mouth in attempts to evoke vomiting. Patient states this provides her some relief.

## 2022-05-20 NOTE — ED Provider Notes (Signed)
[place] EMERGENCY DEPARTMENT AT [facility] Provider Note   CSN: [id number] Arrival date & time: 05/20/22  1804     History  Chief Complaint  Patient presents with   Emesis   HPI Jennifer Hogan is a 52 y.o. female with type 1 diabetes, dilated cardiomyopathy, CAD status post CABG x 3 presenting for abdominal pain and vomiting.  Symptoms started this morning.  Abdominal pain located in the epigastric region.  Patient states it is a gnawing and stabbing pain.  It is nonradiating.  Denies chest pain shortness of breath.  States she is also had associated vomiting.  Emesis is nonbloody.  Had a bowel movement earlier today.  Denies diarrhea.  Endorses nausea.  Denies fever.  Also states that she "sweated off" her insulin pump earlier today and most recent blood sugar at home was greater than 400.  Her father was able to replace her pump and now is infusing properly.  Denies urinary changes.  Endorses last marijuana use on Tuesday.  Denies recent alcohol use.   Emesis      Home Medications Prior to Admission medications   Medication Sig Start Date End Date Taking? Authorizing Provider  acetaminophen (TYLENOL) 325 MG tablet Take 650 mg by mouth daily as needed for mild pain, fever or headache.    [provider]  albuterol (VENTOLIN HFA) 108 (90 Base) MCG/ACT inhaler INHALE 2 PUFFS INTO THE LUNGS EVERY 6 (SIX) HOURS AS NEEDED FOR WHEEZING OR SHORTNESS OF BREATH. 10/25/21   Newlin, Odette Horns, MD  Alirocumab (PRALUENT) 150 MG/ML SOAJ Inject 1 pen  into the skin every 14 (fourteen) days. 09/23/21   Quintella Reichert, MD  aspirin 81 MG EC tablet Take 1 tablet (81 mg total) by mouth daily. Patient taking differently: Take 81 mg by mouth at bedtime. 12/07/17   Amin, Loura Halt, MD  b complex vitamins tablet Take 1 tablet by mouth 3 (three) times a week.    [provider]  budesonide (PULMICORT) 0.25 MG/2ML nebulizer solution Take 0.25 mg by nebulization daily as needed  (shortness of breath/wheezing).    [provider]  buPROPion (WELLBUTRIN XL) 150 MG 24 hr tablet Take 1 tablet (150 mg total) by mouth every morning. 03/06/22 06/17/22  Karsten Ro, MD  clopidogrel (PLAVIX) 75 MG tablet Take 1 tablet (75 mg total) by mouth daily with breakfast. Patient taking differently: Take 75 mg by mouth at bedtime. 12/05/21   Quintella Reichert, MD  Continuous Blood Gluc Sensor (DEXCOM G7 SENSOR) MISC Apply 1 sensor every 10 days 01/04/22   Carlus Pavlov, MD  diclofenac Sodium (VOLTAREN) 1 % GEL Apply 2-4 g topically 3 (three) times daily as needed (knee pain).    [provider]  diphenhydrAMINE (BENADRYL) 25 MG tablet Take 37.5 mg by mouth at bedtime.    [provider]  DULoxetine (CYMBALTA) 30 MG capsule Take 1 capsule (30 mg total) by mouth at bedtime. 03/06/22   Karsten Ro, MD  fluticasone (FLONASE) 50 MCG/ACT nasal spray Insert 1 spray each nostril daily for 3 days then use 2-3 times a week as needed 05/16/22   [name], NP  furosemide (LASIX) 20 MG tablet Take 1 tablet (20 mg total) by mouth daily as needed (for weight gain). 10/14/20   Clegg, Amy D, NP  Glucagon 3 MG/DOSE POWD Place 3 mg into the nose once as needed for up to 1 dose. 07/01/21   Carlus Pavlov, MD  Insulin Disposable Pump (OMNIPOD 5  G6 INTRO, GEN 5,) KIT Use as directed. 05/03/22   Carlus Pavlov, MD  Insulin Disposable Pump (OMNIPOD 5 G6 PODS, GEN 5,) MISC Use every 3 (three) days. 05/03/22   Carlus Pavlov, MD  insulin lispro (HUMALOG) 100 UNIT/ML injection Inject 0.35 mLs (35 Units total) into the skin daily via pump. 03/08/22   Carlus Pavlov, MD  levothyroxine (SYNTHROID) 100 MCG tablet Take 1 tablet (100 mcg total) by mouth daily before breakfast. 04/10/22   Hoy Register, MD  losartan (COZAAR) 25 MG tablet Take 1 tablet (25 mg total) by mouth daily. 01/16/22   Pennie Banter, DO  meloxicam (MOBIC) 15 MG tablet Take 1 tablet (15 mg total) by mouth  daily. 05/17/22   [name], NP  methylcellulose (CITRUCEL) oral powder Take 1 packet by mouth daily. 12/23/21   Armbruster, Willaim Rayas, MD  metoCLOPramide (REGLAN) 5 MG tablet Take 1 tablet (5 mg total) by mouth every 8 (eight) hours as needed for nausea or vomiting. 12/23/21 12/23/22  Benancio Deeds, MD  metoprolol succinate (TOPROL-XL) 25 MG 24 hr tablet Take 1 tablet (25 mg total) by mouth daily. 01/16/22   Pennie Banter, DO  mometasone-formoterol (DULERA) 100-5 MCG/ACT AERO Inhale 2 puffs into the lungs 2 (two) times daily. 02/15/22   [name], NP  montelukast (SINGULAIR) 10 MG tablet Take 1 tablet (10 mg total) by mouth at bedtime. 02/15/22   [name], NP  Multiple Vitamin (MULTIVITAMIN WITH MINERALS) TABS tablet Take 1 tablet by mouth daily.    [provider]  neomycin-polymyxin b-dexamethasone (MAXITROL) 3.5-10000-0.1 OINT Place 1 Application into both eyes 2 (two) times daily. 03/18/22   [provider]  nitroGLYCERIN (NITROSTAT) 0.4 MG SL tablet Place 1 tablet (0.4 mg total) under the tongue every 5 (five) minutes as needed for chest pain. 10/14/20   Clegg, Amy D, NP  omeprazole (PRILOSEC OTC) 20 MG tablet Take 20 mg by mouth daily as needed (acid reflux).    [provider]  ondansetron (ZOFRAN) 4 MG tablet Take 1 tablet (4 mg total) by mouth daily as needed for nausea or vomiting. 03/24/22 03/24/23  Hollice Espy, MD  OVER THE COUNTER MEDICATION Take 3 tablets by mouth See admin instructions. [employer] Super Lysine Immune Support- Take 3 tablets by mouth once a day on an empty stomach    [provider]  pantoprazole (PROTONIX) 40 MG tablet Take 1 tablet (40 mg total) by mouth 2 (two) times daily. 03/24/22   Hollice Espy, MD      Allergies    Atorvastatin, Crestor [rosuvastatin calcium], Monosodium glutamate, Zetia [ezetimibe], Ciprofloxin hcl [ciprofloxacin], and Food color red [red dye]    Review of Systems    Review of Systems  Gastrointestinal:  Positive for vomiting.    Physical Exam   Vitals:   05/21/22 1300 05/21/22 1400  BP: 114/87 121/88  Pulse: (!) 135 (!) 130  Resp: 14 (!) 26  Temp:    SpO2: 100% 100%    CONSTITUTIONAL:  well-appearing, distressed d/t pain NEURO:  Alert and oriented x 3, CN 3-12 grossly intact EYES:  eyes equal and reactive ENT/NECK:  Supple, no stridor  CARDIO:  tachycardic and regular rhythm, appears well-perfused  PULM:  No respiratory distress, CTAB GI/GU:  non-distended, soft, upper abdominal tenderness MSK/SPINE:  No gross deformities, no edema, moves all extremities  SKIN:  pale, no rash, atraumatic  *Additional and/or pertinent findings included in MDM below   ED  Results / Procedures / Treatments   Labs (all labs ordered are listed, but only abnormal results are displayed) Labs Reviewed  CBC WITH DIFFERENTIAL/PLATELET - Abnormal; Notable for the following components:      Result Value   WBC 13.6 (*)    HCT 35.8 (*)    Platelets 477 (*)    Neutro Abs 12.4 (*)    Lymphs Abs 0.5 (*)    All other components within normal limits  COMPREHENSIVE METABOLIC PANEL - Abnormal; Notable for the following components:   CO2 17 (*)    Glucose, Bld 331 (*)    BUN 22 (*)    Creatinine, Ser 1.02 (*)    Anion gap 19 (*)    All other components within normal limits  LIPASE, BLOOD - Abnormal; Notable for the following components:   Lipase <10 (*)    All other components within normal limits  URINALYSIS, ROUTINE W REFLEX MICROSCOPIC - Abnormal; Notable for the following components:   Glucose, UA >1,000 (*)    Ketones, ur >80 (*)    Protein, ur TRACE (*)    Leukocytes,Ua TRACE (*)    All other components within normal limits  BASIC METABOLIC PANEL - Abnormal; Notable for the following components:   CO2 16 (*)    Glucose, Bld 219 (*)    Anion gap 18 (*)    All other components within normal limits  BASIC METABOLIC PANEL - Abnormal; Notable for the  following components:   CO2 19 (*)    Glucose, Bld 178 (*)    Creatinine, Ser 1.06 (*)    Calcium 8.8 (*)    All other components within normal limits  BASIC METABOLIC PANEL - Abnormal; Notable for the following components:   CO2 19 (*)    Glucose, Bld 156 (*)    All other components within normal limits  BETA-HYDROXYBUTYRIC ACID - Abnormal; Notable for the following components:   Beta-Hydroxybutyric Acid 2.18 (*)    All other components within normal limits  BETA-HYDROXYBUTYRIC ACID - Abnormal; Notable for the following components:   Beta-Hydroxybutyric Acid 0.38 (*)    All other components within normal limits  LACTIC ACID, PLASMA - Abnormal; Notable for the following components:   Lactic Acid, Venous 3.4 (*)    All other components within normal limits  LACTIC ACID, PLASMA - Abnormal; Notable for the following components:   Lactic Acid, Venous 2.4 (*)    All other components within normal limits  GLUCOSE, CAPILLARY - Abnormal; Notable for the following components:   Glucose-Capillary 253 (*)    All other components within normal limits  GLUCOSE, CAPILLARY - Abnormal; Notable for the following components:   Glucose-Capillary 163 (*)    All other components within normal limits  GLUCOSE, CAPILLARY - Abnormal; Notable for the following components:   Glucose-Capillary 133 (*)    All other components within normal limits  GLUCOSE, CAPILLARY - Abnormal; Notable for the following components:   Glucose-Capillary 169 (*)    All other components within normal limits  GLUCOSE, CAPILLARY - Abnormal; Notable for the following components:   Glucose-Capillary 152 (*)    All other components within normal limits  GLUCOSE, CAPILLARY - Abnormal; Notable for the following components:   Glucose-Capillary 155 (*)    All other components within normal limits  LACTIC ACID, PLASMA - Abnormal; Notable for the following components:   Lactic Acid, Venous 2.4 (*)    All other components within  normal limits  GLUCOSE, CAPILLARY -  Abnormal; Notable for the following components:   Glucose-Capillary 158 (*)    All other components within normal limits  GLUCOSE, CAPILLARY - Abnormal; Notable for the following components:   Glucose-Capillary 168 (*)    All other components within normal limits  CBG MONITORING, ED - Abnormal; Notable for the following components:   Glucose-Capillary 344 (*)    All other components within normal limits  I-STAT VENOUS BLOOD GAS, ED - Abnormal; Notable for the following components:   pCO2, Ven 32.9 (*)    Bicarbonate 17.7 (*)    TCO2 19 (*)    Acid-base deficit 7.0 (*)    All other components within normal limits  CBG MONITORING, ED - Abnormal; Notable for the following components:   Glucose-Capillary 208 (*)    All other components within normal limits  CBG MONITORING, ED - Abnormal; Notable for the following components:   Glucose-Capillary 279 (*)    All other components within normal limits  CBG MONITORING, ED - Abnormal; Notable for the following components:   Glucose-Capillary 293 (*)    All other components within normal limits  I-STAT VENOUS BLOOD GAS, ED - Abnormal; Notable for the following components:   pH, Ven 7.213 (*)    Bicarbonate 18.9 (*)    TCO2 20 (*)    Acid-base deficit 9.0 (*)    HCT 35.0 (*)    Hemoglobin 11.9 (*)    All other components within normal limits  TROPONIN I (HIGH SENSITIVITY) - Abnormal; Notable for the following components:   Troponin I (High Sensitivity) 300 (*)    All other components within normal limits  TROPONIN I (HIGH SENSITIVITY) - Abnormal; Notable for the following components:   Troponin I (High Sensitivity) 1,792 (*)    All other components within normal limits  TROPONIN I (HIGH SENSITIVITY) - Abnormal; Notable for the following components:   Troponin I (High Sensitivity) 2,087 (*)    All other components within normal limits  MRSA NEXT GEN BY PCR, NASAL  CULTURE, BLOOD (ROUTINE X 2)   CULTURE, BLOOD (ROUTINE X 2)  EXPECTORATED SPUTUM ASSESSMENT W GRAM STAIN, RFLX TO RESP C  PREGNANCY, URINE  PROCALCITONIN  LIPASE, BLOOD  TRIGLYCERIDES  HEPARIN LEVEL (UNFRACTIONATED)  LACTIC ACID, PLASMA  URINALYSIS, W/ REFLEX TO CULTURE (INFECTION SUSPECTED)  TROPONIN I (HIGH SENSITIVITY)    EKG EKG Interpretation  Date/Time:  Saturday May 20 2022 23:40:51 EDT Ventricular Rate:  133 PR Interval:  129 QRS Duration: 116 QT Interval:  305 QTC Calculation: 454 R Axis:   266 Text Interpretation: Sinus tachycardia Left anterior fascicular block Anterolateral infarct, old since last tracing no significant change Confirmed by Rolan Bucco [phone]) on 05/21/2022 8:40:32 AM  Radiology DG Chest Port 1 View  Result Date: 05/21/2022 CLINICAL DATA:  Abdominal pain. EXAM: PORTABLE CHEST 1 VIEW COMPARISON:  03/23/2022. FINDINGS: The heart size and mediastinal contours are within normal limits. There is hyperinflation of the lungs with interstitial prominence bilaterally. No consolidation, effusion, or pneumothorax. Sternotomy wires are present over the midline. IMPRESSION: Hyperinflation of the lungs with interstitial prominence bilaterally slightly increased from the prior exam, may be infectious or inflammatory. Electronically Signed   By: Thornell Sartorius M.D.   On: 05/21/2022 00:34   CT ABDOMEN PELVIS W CONTRAST  Result Date: 05/20/2022 CLINICAL DATA:  Acute abdominal pain with vomiting, initial encounter EXAM: CT ABDOMEN AND PELVIS WITH CONTRAST TECHNIQUE: Multidetector CT imaging of the abdomen and pelvis was performed using the standard protocol following bolus administration  of intravenous contrast. RADIATION DOSE REDUCTION: This exam was performed according to the departmental dose-optimization program which includes automated exposure control, adjustment of the mA and/or kV according to patient size and/or use of iterative reconstruction technique. CONTRAST:  OMNIPAQUE IOHEXOL 300  MG/ML  SOLN COMPARISON:  03/23/2022 FINDINGS: Lower chest: No acute abnormality. Hepatobiliary: No focal liver abnormality is seen. No gallstones, gallbladder wall thickening, or biliary dilatation. Pancreas: Unremarkable. No pancreatic ductal dilatation or surrounding inflammatory changes. Spleen: Normal in size without focal abnormality. Adrenals/Urinary Tract: Adrenal glands are within normal limits. Kidneys show normal enhancement pattern. No renal calculi or obstructive changes are noted. Normal excretion is noted bilaterally. The bladder is well distended. Stomach/Bowel: Appendix has been surgically removed. No obstructive or inflammatory changes of the colon are noted. Small bowel and stomach are within normal limits. Vascular/Lymphatic: Atherosclerotic calcifications of the abdominal aorta are noted. No lymphadenopathy is seen. Reproductive: Uterus and bilateral adnexa are unremarkable. Other: No abdominal wall hernia or abnormality. No abdominopelvic ascites. Musculoskeletal: Degenerative changes of lumbar spine are noted. IMPRESSION: No acute abnormality noted to correspond with the given clinical history. Electronically Signed   By: Alcide Clever M.D.   On: 05/20/2022 22:38    Procedures .Critical Care  Performed by: Gareth Eagle, PA-C Authorized by: Gareth Eagle, PA-C   Critical care provider statement:    Critical care time (minutes):  30   Critical care was necessary to treat or prevent imminent or life-threatening deterioration of the following conditions:  Metabolic crisis   Critical care was time spent personally by me on the following activities:  Development of treatment plan with patient or surrogate, discussions with consultants, evaluation of patient's response to treatment, examination of patient, ordering and review of laboratory studies, ordering and review of radiographic studies, ordering and performing treatments and interventions, pulse oximetry, re-evaluation of  patient's condition and review of old charts     Medications Ordered in ED Medications  insulin regular, human (MYXREDLIN) 100 units/ 100 mL infusion (0 Units/hr Intravenous Stopped 05/21/22 1232)  dextrose 50 % solution 0-50 mL (has no administration in time range)  Chlorhexidine Gluconate Cloth 2 % PADS 6 each (6 each Topical Given 05/21/22 0913)  promethazine (PHENERGAN) 12.5 mg in sodium chloride 0.9 % 50 mL IVPB (0 mg Intravenous Stopped 05/21/22 0900)  acetaminophen (TYLENOL) tablet 650 mg (has no administration in time range)  aspirin EC tablet 81 mg (has no administration in time range)  buPROPion (WELLBUTRIN XL) 24 hr tablet 150 mg (150 mg Oral Given 05/21/22 0604)  clopidogrel (PLAVIX) tablet 75 mg (has no administration in time range)  diclofenac Sodium (VOLTAREN) 1 % topical gel 2-4 g (has no administration in time range)  diphenhydrAMINE (BENADRYL) capsule 25-50 mg (has no administration in time range)  DULoxetine (CYMBALTA) DR capsule 30 mg (has no administration in time range)  fluticasone (FLONASE) 50 MCG/ACT nasal spray 1 spray (has no administration in time range)  levothyroxine (SYNTHROID) tablet 100 mcg (100 mcg Oral Given 05/21/22 0604)  montelukast (SINGULAIR) tablet 10 mg (has no administration in time range)  pantoprazole (PROTONIX) EC tablet 40 mg (40 mg Oral Given 05/21/22 1013)  polyethylene glycol (MIRALAX / GLYCOLAX) packet 17 g (has no administration in time range)  Oral care mouth rinse (has no administration in time range)  heparin ADULT infusion 100 units/mL (25000 units/233mL) (800 Units/hr Intravenous Infusion Verify 05/21/22 1400)  cefTRIAXone (ROCEPHIN) 2 g in sodium chloride 0.9 % 100 mL IVPB (0 g Intravenous  Stopped 05/21/22 0943)  levalbuterol (XOPENEX) nebulizer solution 0.63 mg (0.63 mg Nebulization Given 05/21/22 0858)  insulin glargine-yfgn (SEMGLEE) injection 10 Units (10 Units Subcutaneous Given 05/21/22 1039)  insulin aspart (novoLOG) injection 0-15  Units (2 Units Subcutaneous Given 05/21/22 1243)  insulin aspart (novoLOG) injection 0-5 Units (has no administration in time range)  metoprolol succinate (TOPROL-XL) 24 hr tablet 25 mg (25 mg Oral Given 05/21/22 1015)  losartan (COZAAR) tablet 25 mg (has no administration in time range)  mometasone-formoterol (DULERA) 100-5 MCG/ACT inhaler 2 puff (has no administration in time range)  furosemide (LASIX) injection 60 mg (has no administration in time range)  morphine (PF) 4 MG/ML injection 4 mg (4 mg Intravenous Given 05/20/22 1923)  ondansetron (ZOFRAN) injection 4 mg (4 mg Intravenous Given 05/20/22 1921)  sodium chloride 0.9 % bolus 1,000 mL (0 mLs Intravenous Stopped 05/20/22 2039)  iohexol (OMNIPAQUE) 300 MG/ML solution 100 mL (100 mLs Intravenous Contrast Given 05/20/22 2206)  HYDROmorphone (DILAUDID) injection 1 mg (1 mg Intravenous Given 05/20/22 2019)  lactated ringers bolus 1,286 mL (0 mLs Intravenous Stopped 05/21/22 0000)  potassium chloride 10 mEq in 100 mL IVPB (0 mEq Intravenous Stopped 05/21/22 0137)  HYDROmorphone (DILAUDID) injection 1 mg (1 mg Intravenous Given 05/20/22 2323)  metoCLOPramide (REGLAN) injection 5 mg (5 mg Intravenous Given 05/20/22 2330)  metoCLOPramide (REGLAN) injection 5 mg (5 mg Intravenous Given 05/21/22 0032)  lactated ringers bolus 1,000 mL ( Intravenous Stopped 05/21/22 0344)  heparin bolus via infusion 3,000 Units (3,000 Units Intravenous Bolus from Bag 05/21/22 0605)  furosemide (LASIX) injection 60 mg (60 mg Intravenous Given 05/21/22 0855)    ED Course/ Medical Decision Making/ A&P Clinical Course as of 05/21/22 1440  Sat May 20, 2022  2326 Comment: NOTIFIED PHYSICIAN [JR]    Clinical Course User Index [JR] Gareth Eagle, PA-C                             Medical Decision Making Amount and/or Complexity of Data Reviewed Labs: ordered. Decision-making details documented in ED Course. Radiology: ordered.  Risk Prescription drug  management. Decision regarding hospitalization.   Initial Impression and Ddx 52 year old well-appearing female presenting for abdominal pain and emesis.  Exam notable for upper abdominal tenderness.  Dx includes DKA, pancreatitis, acute cholecystitis, nephrolithiasis and pyelonephritis, other metabolic derangement, and sepsis. Patient PMH that increases complexity of ED encounter:  type 1 diabetes, dilated cardiomyopathy, CAD status post CABG x 3   Interpretation of Diagnostics -I independent reviewed and interpreted the labs as followed: Hyperglycemia, elevated anion gap  - I independently visualized the following imaging with scope of interpretation limited to determining acute life threatening conditions related to emergency care: CT abdomen pelvis, which revealed no acute abnormality  -Personally reviewed and interpreted EKG which revealed sinus tachycardia  Patient Reassessment and Ultimate Disposition/Management Initially well-appearing, treat her pain with Dilaudid and Zofran for nausea.  Reassessment patient felt her pain was better.  With hyperglycemia and anion gap along with recent history of emesis, there was primary concern for DKA..  Treated per protocol.  After returning from CT, patient was notably more tachycardic, cool with a low-grade temp.  There was concern for possible sepsis.  At time send lactic acid and cultures.  Suspected also that she may be dehydrated.  Initiated another bolus of fluids along.  Blood sugar did trend down during encounter.  Admitted to hospital service for DKA and to be transferred to [facility]  for further management in the stepdown unit.  Patient management required discussion with the following services or consulting groups:  Hospitalist Service  Complexity of Problems Addressed Acute complicated illness or Injury  Additional Data Reviewed and Analyzed Further history obtained from: Further history from spouse/family member, Past medical history and  medications listed in the EMR, and Prior ED visit notes  Patient Encounter Risk Assessment Consideration of hospitalization         Final Clinical Impression(s) / ED Diagnoses Final diagnoses:  Type 1 diabetes mellitus with ketoacidosis without coma [facility])    Rx / DC Orders ED Discharge Orders     None         Gareth Eagle, PA-C 05/21/22 1440    Gwyneth Sprout, MD 05/22/22 0022

## 2022-05-20 NOTE — ED Notes (Signed)
PA and MD aware

## 2022-05-20 NOTE — Progress Notes (Addendum)
Plan of Care Note for accepted transfer   Patient: Jennifer Hogan MRN: [id number]   DOA: 05/20/2022  Facility requesting transfer: [facility] ED Requesting Provider: Riki Sheer, PA-EDP Reason for transfer: DKA Type 1  Facility course: The patient is a 52 year old female with past medical history significant for type 1 diabetes, gastroparesis, coronary artery disease status post CABG, HFrEF 45%, hypothyroidism, hypertension, hyperlipidemia, GERD, chronic anxiety/depression, THC use, who presented to [facility] ED with complaints of persistent nausea and vomiting with hyperglycemia, blood sugar today in the 400s.  Also endorses stabbing epigastric pain that is nonradiating.  No reported chest pain or dyspnea.  No diarrhea or constipation.  No reported urinary symptoms or subjective fevers.  Reportedly last THC use was on Tuesday, 5 days ago.    In the ED, tachycardic with heart rate in the 150s.  Lab studies notable for hyperglycemia serum glucose 331, metabolic acidosis with serum bicarb of 17 and anion gap of 19.  Ketonuria and glucosuria on urine analysis.  Mild asymptomatic pyuria.  CT abdomen and pelvis with contrast revealed the following: No acute abnormality noted to correspond with the given clinical history.  Due to concern for type I DKA, the patient was started on insulin drip and IV fluid hydration.  Additionally received IV Dilaudid 1 mg x 2 doses, IV Reglan 5 mg x 1 dose, IV Zofran 4 mg x 1, IV morphine 4 mg x 1.  EDP requested admission for further management of DKA type I.  Admitted to [facility] progressive care unit as inpatient status.   Plan of care: The patient is accepted for admission to Progressive unit, at [facility].  Author: Darlin Drop, DO 05/20/2022  Check [email or web address] for on-call coverage.  Nursing staff, Please call [facility] Admits & Consults System-Wide number on Amion as soon as patient's arrival, so appropriate admitting provider can  evaluate the pt.

## 2022-05-20 NOTE — ED Triage Notes (Signed)
She c/o vomited several times today. She recognizes this as gastroparesis. She tells me that she has maxed out of her insulin pump bolus capabilities, and that notwithstanding, her most recent cbg was >400.

## 2022-05-20 NOTE — ED Notes (Signed)
RT ran VBG on pt with the following results.Pt on RA w/sats of 100%. MD Plunkett notified of results.    Latest Reference Range & Units 05/20/22 19:29  Sample type  VENOUS  pH, Ven 7.25 - 7.43  7.340  pCO2, Ven 44 - 60 mmHg 32.9 (L)  pO2, Ven 32 - 45 mmHg 36  TCO2 22 - 32 mmol/L 19 (L)  Acid-base deficit 0.0 - 2.0 mmol/L 7.0 (H)  Bicarbonate 20.0 - 28.0 mmol/L 17.7 (L)  O2 Saturation % 66  Patient temperature  98.7 F  Collection site  IV start  (L): Data is abnormally low (H): Data is abnormally high

## 2022-05-21 ENCOUNTER — Emergency Department (HOSPITAL_BASED_OUTPATIENT_CLINIC_OR_DEPARTMENT_OTHER): Payer: Medicare Other

## 2022-05-21 DIAGNOSIS — N179 Acute kidney failure, unspecified: Secondary | ICD-10-CM | POA: Diagnosis present

## 2022-05-21 DIAGNOSIS — I5084 End stage heart failure: Secondary | ICD-10-CM | POA: Diagnosis present

## 2022-05-21 DIAGNOSIS — I255 Ischemic cardiomyopathy: Secondary | ICD-10-CM | POA: Diagnosis present

## 2022-05-21 DIAGNOSIS — I5021 Acute systolic (congestive) heart failure: Secondary | ICD-10-CM | POA: Diagnosis not present

## 2022-05-21 DIAGNOSIS — E1010 Type 1 diabetes mellitus with ketoacidosis without coma: Secondary | ICD-10-CM | POA: Diagnosis not present

## 2022-05-21 DIAGNOSIS — R0609 Other forms of dyspnea: Secondary | ICD-10-CM | POA: Diagnosis not present

## 2022-05-21 DIAGNOSIS — Z951 Presence of aortocoronary bypass graft: Secondary | ICD-10-CM | POA: Diagnosis not present

## 2022-05-21 DIAGNOSIS — I110 Hypertensive heart disease with heart failure: Secondary | ICD-10-CM | POA: Diagnosis not present

## 2022-05-21 DIAGNOSIS — E440 Moderate protein-calorie malnutrition: Secondary | ICD-10-CM | POA: Diagnosis not present

## 2022-05-21 DIAGNOSIS — K219 Gastro-esophageal reflux disease without esophagitis: Secondary | ICD-10-CM | POA: Diagnosis present

## 2022-05-21 DIAGNOSIS — E860 Dehydration: Secondary | ICD-10-CM | POA: Diagnosis present

## 2022-05-21 DIAGNOSIS — Z79890 Hormone replacement therapy (postmenopausal): Secondary | ICD-10-CM | POA: Diagnosis not present

## 2022-05-21 DIAGNOSIS — I420 Dilated cardiomyopathy: Secondary | ICD-10-CM | POA: Diagnosis present

## 2022-05-21 DIAGNOSIS — R570 Cardiogenic shock: Secondary | ICD-10-CM | POA: Diagnosis not present

## 2022-05-21 DIAGNOSIS — J9601 Acute respiratory failure with hypoxia: Secondary | ICD-10-CM | POA: Diagnosis not present

## 2022-05-21 DIAGNOSIS — E1110 Type 2 diabetes mellitus with ketoacidosis without coma: Secondary | ICD-10-CM | POA: Diagnosis present

## 2022-05-21 DIAGNOSIS — D649 Anemia, unspecified: Secondary | ICD-10-CM | POA: Diagnosis not present

## 2022-05-21 DIAGNOSIS — I5023 Acute on chronic systolic (congestive) heart failure: Secondary | ICD-10-CM | POA: Diagnosis not present

## 2022-05-21 DIAGNOSIS — E1043 Type 1 diabetes mellitus with diabetic autonomic (poly)neuropathy: Secondary | ICD-10-CM | POA: Diagnosis not present

## 2022-05-21 DIAGNOSIS — I2510 Atherosclerotic heart disease of native coronary artery without angina pectoris: Secondary | ICD-10-CM | POA: Diagnosis not present

## 2022-05-21 DIAGNOSIS — R609 Edema, unspecified: Secondary | ICD-10-CM | POA: Diagnosis not present

## 2022-05-21 DIAGNOSIS — E876 Hypokalemia: Secondary | ICD-10-CM | POA: Diagnosis present

## 2022-05-21 DIAGNOSIS — I5022 Chronic systolic (congestive) heart failure: Secondary | ICD-10-CM | POA: Diagnosis not present

## 2022-05-21 DIAGNOSIS — I25118 Atherosclerotic heart disease of native coronary artery with other forms of angina pectoris: Secondary | ICD-10-CM | POA: Diagnosis not present

## 2022-05-21 DIAGNOSIS — J810 Acute pulmonary edema: Secondary | ICD-10-CM | POA: Diagnosis not present

## 2022-05-21 DIAGNOSIS — R079 Chest pain, unspecified: Secondary | ICD-10-CM | POA: Diagnosis not present

## 2022-05-21 DIAGNOSIS — Z01810 Encounter for preprocedural cardiovascular examination: Secondary | ICD-10-CM | POA: Diagnosis not present

## 2022-05-21 DIAGNOSIS — F32A Depression, unspecified: Secondary | ICD-10-CM | POA: Diagnosis not present

## 2022-05-21 DIAGNOSIS — I2583 Coronary atherosclerosis due to lipid rich plaque: Secondary | ICD-10-CM | POA: Diagnosis not present

## 2022-05-21 DIAGNOSIS — I214 Non-ST elevation (NSTEMI) myocardial infarction: Secondary | ICD-10-CM | POA: Diagnosis not present

## 2022-05-21 DIAGNOSIS — E039 Hypothyroidism, unspecified: Secondary | ICD-10-CM | POA: Diagnosis not present

## 2022-05-21 DIAGNOSIS — I82C12 Acute embolism and thrombosis of left internal jugular vein: Secondary | ICD-10-CM | POA: Diagnosis present

## 2022-05-21 DIAGNOSIS — J45909 Unspecified asthma, uncomplicated: Secondary | ICD-10-CM | POA: Diagnosis not present

## 2022-05-21 DIAGNOSIS — E7800 Pure hypercholesterolemia, unspecified: Secondary | ICD-10-CM | POA: Diagnosis present

## 2022-05-21 DIAGNOSIS — Z794 Long term (current) use of insulin: Secondary | ICD-10-CM | POA: Diagnosis not present

## 2022-05-21 LAB — I-STAT VENOUS BLOOD GAS, ED
Acid-base deficit: 9 mmol/L — ABNORMAL HIGH (ref 0.0–2.0)
Bicarbonate: 18.9 mmol/L — ABNORMAL LOW (ref 20.0–28.0)
Calcium, Ion: 1.26 mmol/L (ref 1.15–1.40)
HCT: 35 % — ABNORMAL LOW (ref 36.0–46.0)
Hemoglobin: 11.9 g/dL — ABNORMAL LOW (ref 12.0–15.0)
O2 Saturation: 49 %
Patient temperature: 98.5
Potassium: 4.7 mmol/L (ref 3.5–5.1)
Sodium: 136 mmol/L (ref 135–145)
TCO2: 20 mmol/L — ABNORMAL LOW (ref 22–32)
pCO2, Ven: 46.9 mmHg (ref 44–60)
pH, Ven: 7.213 — ABNORMAL LOW (ref 7.25–7.43)
pO2, Ven: 32 mmHg (ref 32–45)

## 2022-05-21 LAB — BASIC METABOLIC PANEL
Anion gap: 11 (ref 5–15)
Anion gap: 11 (ref 5–15)
BUN: 17 mg/dL (ref 6–20)
BUN: 15 mg/dL (ref 6–20)
CO2: 19 mmol/L — ABNORMAL LOW (ref 22–32)
CO2: 19 mmol/L — ABNORMAL LOW (ref 22–32)
Calcium: 8.8 mg/dL — ABNORMAL LOW (ref 8.9–10.3)
Calcium: 9.3 mg/dL (ref 8.9–10.3)
Chloride: 105 mmol/L (ref 98–111)
Chloride: 106 mmol/L (ref 98–111)
Creatinine, Ser: 1.06 mg/dL — ABNORMAL HIGH (ref 0.44–1.00)
Creatinine, Ser: 0.97 mg/dL (ref 0.44–1.00)
GFR, Estimated: >60 mL/min (ref >60)
GFR, Estimated: >60 mL/min (ref >60)
Glucose, Bld: 178 mg/dL — ABNORMAL HIGH (ref 70–99)
Glucose, Bld: 156 mg/dL — ABNORMAL HIGH (ref 70–99)
Potassium: 3.9 mmol/L (ref 3.5–5.1)
Potassium: 4 mmol/L (ref 3.5–5.1)
Sodium: 135 mmol/L (ref 135–145)
Sodium: 136 mmol/L (ref 135–145)

## 2022-05-21 LAB — HEPARIN LEVEL (UNFRACTIONATED)
Heparin Unfractionated: 0.39 IU/mL (ref 0.30–0.70)
Heparin Unfractionated: 0.53 IU/mL (ref 0.30–0.70)

## 2022-05-21 LAB — GLUCOSE, CAPILLARY
Glucose-Capillary: 253 mg/dL — ABNORMAL HIGH (ref 70–99)
Glucose-Capillary: 163 mg/dL — ABNORMAL HIGH (ref 70–99)
Glucose-Capillary: 133 mg/dL — ABNORMAL HIGH (ref 70–99)
Glucose-Capillary: 169 mg/dL — ABNORMAL HIGH (ref 70–99)
Glucose-Capillary: 152 mg/dL — ABNORMAL HIGH (ref 70–99)
Glucose-Capillary: 155 mg/dL — ABNORMAL HIGH (ref 70–99)
Glucose-Capillary: 158 mg/dL — ABNORMAL HIGH (ref 70–99)
Glucose-Capillary: 168 mg/dL — ABNORMAL HIGH (ref 70–99)
Glucose-Capillary: 148 mg/dL — ABNORMAL HIGH (ref 70–99)
Glucose-Capillary: 215 mg/dL — ABNORMAL HIGH (ref 70–99)
Glucose-Capillary: 156 mg/dL — ABNORMAL HIGH (ref 70–99)
Glucose-Capillary: 153 mg/dL — ABNORMAL HIGH (ref 70–99)
Glucose-Capillary: 179 mg/dL — ABNORMAL HIGH (ref 70–99)
Glucose-Capillary: 205 mg/dL — ABNORMAL HIGH (ref 70–99)

## 2022-05-21 LAB — CBG MONITORING, ED
Glucose-Capillary: 279 mg/dL — ABNORMAL HIGH (ref 70–99)
Glucose-Capillary: 293 mg/dL — ABNORMAL HIGH (ref 70–99)

## 2022-05-21 LAB — TROPONIN I (HIGH SENSITIVITY)
Troponin I (High Sensitivity): 15 ng/L (ref <18)
Troponin I (High Sensitivity): 1792 ng/L (ref <18)
Troponin I (High Sensitivity): 2087 ng/L (ref <18)
Troponin I (High Sensitivity): 300 ng/L (ref <18)

## 2022-05-21 LAB — LACTIC ACID, PLASMA
Lactic Acid, Venous: 2.4 mmol/L (ref 0.5–1.9)
Lactic Acid, Venous: 2.4 mmol/L (ref 0.5–1.9)
Lactic Acid, Venous: 3.4 mmol/L (ref 0.5–1.9)

## 2022-05-21 LAB — TRIGLYCERIDES: Triglycerides: 92 mg/dL (ref <150)

## 2022-05-21 LAB — BETA-HYDROXYBUTYRIC ACID
Beta-Hydroxybutyric Acid: 2.18 mmol/L — ABNORMAL HIGH (ref 0.05–0.27)
Beta-Hydroxybutyric Acid: 0.38 mmol/L — ABNORMAL HIGH (ref 0.05–0.27)

## 2022-05-21 LAB — LIPASE, BLOOD: Lipase: 20 U/L (ref 11–51)

## 2022-05-21 LAB — MRSA NEXT GEN BY PCR, NASAL: MRSA by PCR Next Gen: NOT DETECTED

## 2022-05-21 LAB — PROCALCITONIN: Procalcitonin: 3.12 ng/mL

## 2022-05-21 MED ORDER — ACETAMINOPHEN 325 MG PO TABS
650.0000 mg | ORAL_TABLET | Freq: Every day | ORAL | Status: DC | PRN
  Administered 2022-05-28: 650 mg via ORAL
  Filled 2022-05-21: qty 2

## 2022-05-21 MED ORDER — CHLORHEXIDINE GLUCONATE CLOTH 2 % EX PADS
6.0000 | MEDICATED_PAD | Freq: Every day | CUTANEOUS | Status: DC
  Administered 2022-05-21 – 2022-06-01 (×13): 6 via TOPICAL

## 2022-05-21 MED ORDER — BUDESONIDE 0.25 MG/2ML IN SUSP
0.2500 mg | Freq: Every day | RESPIRATORY_TRACT | Status: DC | PRN
  Administered 2022-05-21: 0.25 mg via RESPIRATORY_TRACT
  Filled 2022-05-21: qty 2

## 2022-05-21 MED ORDER — METOCLOPRAMIDE HCL 5 MG/ML IJ SOLN
5.0000 mg | Freq: Once | INTRAMUSCULAR | Status: AC
  Administered 2022-05-21: 5 mg via INTRAVENOUS
  Filled 2022-05-21: qty 2

## 2022-05-21 MED ORDER — ENOXAPARIN SODIUM 30 MG/0.3ML IJ SOSY: 30.0000 mg | PREFILLED_SYRINGE | INTRAMUSCULAR | Status: DC

## 2022-05-21 MED ORDER — METOPROLOL TARTRATE 5 MG/5ML IV SOLN
2.5000 mg | Freq: Once | INTRAVENOUS | Status: AC
  Administered 2022-05-21: 2.5 mg via INTRAVENOUS
  Filled 2022-05-21: qty 5

## 2022-05-21 MED ORDER — SODIUM CHLORIDE 0.9 % IV SOLN
2.0000 g | INTRAVENOUS | Status: DC
  Administered 2022-05-21 – 2022-05-23 (×3): 2 g via INTRAVENOUS
  Filled 2022-05-21 (×3): qty 20

## 2022-05-21 MED ORDER — LORAZEPAM 2 MG/ML IJ SOLN
0.5000 mg | INTRAMUSCULAR | Status: AC | PRN
  Administered 2022-05-21 – 2022-05-24 (×3): 0.5 mg via INTRAVENOUS
  Filled 2022-05-21 (×3): qty 1

## 2022-05-21 MED ORDER — POLYETHYLENE GLYCOL 3350 17 G PO PACK: 17.0000 g | PACK | Freq: Every day | ORAL | Status: DC | PRN

## 2022-05-21 MED ORDER — METOPROLOL SUCCINATE ER 25 MG PO TB24
25.0000 mg | ORAL_TABLET | Freq: Every day | ORAL | Status: DC
  Administered 2022-05-21: 25 mg via ORAL
  Filled 2022-05-21: qty 1

## 2022-05-21 MED ORDER — PANTOPRAZOLE SODIUM 40 MG PO TBEC
40.0000 mg | DELAYED_RELEASE_TABLET | Freq: Two times a day (BID) | ORAL | Status: DC
  Administered 2022-05-21 – 2022-06-01 (×23): 40 mg via ORAL
  Filled 2022-05-21 (×23): qty 1

## 2022-05-21 MED ORDER — INSULIN ASPART 100 UNIT/ML IJ SOLN
0.0000 [IU] | Freq: Every day | INTRAMUSCULAR | Status: DC
  Administered 2022-05-21: 2 [IU] via SUBCUTANEOUS

## 2022-05-21 MED ORDER — ALBUTEROL SULFATE (2.5 MG/3ML) 0.083% IN NEBU
3.0000 mL | INHALATION_SOLUTION | Freq: Four times a day (QID) | RESPIRATORY_TRACT | Status: DC | PRN
  Filled 2022-05-21: qty 3

## 2022-05-21 MED ORDER — SODIUM CHLORIDE 0.9 % IV SOLN
12.5000 mg | Freq: Four times a day (QID) | INTRAVENOUS | Status: DC | PRN
  Administered 2022-05-21 – 2022-05-22 (×5): 12.5 mg via INTRAVENOUS
  Filled 2022-05-21: qty 0.5
  Filled 2022-05-21: qty 12.5
  Filled 2022-05-21: qty 0.5
  Filled 2022-05-21 (×4): qty 12.5

## 2022-05-21 MED ORDER — LEVOTHYROXINE SODIUM 100 MCG PO TABS
100.0000 ug | ORAL_TABLET | Freq: Every day | ORAL | Status: DC
  Administered 2022-05-21 – 2022-06-01 (×11): 100 ug via ORAL
  Filled 2022-05-21 (×12): qty 1

## 2022-05-21 MED ORDER — LACTATED RINGERS IV BOLUS
1000.0000 mL | Freq: Once | INTRAVENOUS | Status: AC
  Administered 2022-05-21: 1000 mL via INTRAVENOUS

## 2022-05-21 MED ORDER — MONTELUKAST SODIUM 10 MG PO TABS
10.0000 mg | ORAL_TABLET | Freq: Every day | ORAL | Status: DC
  Administered 2022-05-21 – 2022-05-31 (×10): 10 mg via ORAL
  Filled 2022-05-21 (×10): qty 1

## 2022-05-21 MED ORDER — ASPIRIN 81 MG PO TBEC
81.0000 mg | DELAYED_RELEASE_TABLET | Freq: Every day | ORAL | Status: DC
  Administered 2022-05-21 – 2022-05-31 (×10): 81 mg via ORAL
  Filled 2022-05-21 (×10): qty 1

## 2022-05-21 MED ORDER — DIPHENHYDRAMINE HCL 25 MG PO CAPS
25.0000 mg | ORAL_CAPSULE | Freq: Every day | ORAL | Status: DC
  Administered 2022-05-21: 25 mg via ORAL
  Filled 2022-05-21: qty 1

## 2022-05-21 MED ORDER — HEPARIN (PORCINE) 25000 UT/250ML-% IV SOLN
1050.0000 [IU]/h | INTRAVENOUS | Status: DC
  Administered 2022-05-21 (×2): 800 [IU]/h via INTRAVENOUS
  Administered 2022-05-22 – 2022-05-23 (×2): 1050 [IU]/h via INTRAVENOUS
  Filled 2022-05-21 (×3): qty 250

## 2022-05-21 MED ORDER — MIDODRINE HCL 5 MG PO TABS: 5.0000 mg | ORAL_TABLET | Freq: Three times a day (TID) | ORAL | Status: DC

## 2022-05-21 MED ORDER — ALBUMIN HUMAN 25 % IV SOLN
25.0000 g | Freq: Once | INTRAVENOUS | Status: AC
  Administered 2022-05-22: 25 g via INTRAVENOUS
  Filled 2022-05-21: qty 100

## 2022-05-21 MED ORDER — FUROSEMIDE 10 MG/ML IJ SOLN
60.0000 mg | Freq: Once | INTRAMUSCULAR | Status: AC
  Administered 2022-05-21: 60 mg via INTRAVENOUS
  Filled 2022-05-21: qty 6

## 2022-05-21 MED ORDER — FLUTICASONE PROPIONATE 50 MCG/ACT NA SUSP: 1.0000 | Freq: Every day | NASAL | Status: DC | PRN

## 2022-05-21 MED ORDER — SODIUM CHLORIDE 0.9 % IV SOLN: INTRAVENOUS | Status: DC | PRN

## 2022-05-21 MED ORDER — BUPROPION HCL ER (XL) 150 MG PO TB24
150.0000 mg | ORAL_TABLET | ORAL | Status: DC
  Administered 2022-05-21: 150 mg via ORAL
  Filled 2022-05-21 (×2): qty 1

## 2022-05-21 MED ORDER — INSULIN ASPART 100 UNIT/ML IJ SOLN
0.0000 [IU] | Freq: Three times a day (TID) | INTRAMUSCULAR | Status: DC
  Administered 2022-05-21: 5 [IU] via SUBCUTANEOUS
  Administered 2022-05-21: 2 [IU] via SUBCUTANEOUS

## 2022-05-21 MED ORDER — LEVALBUTEROL HCL 0.63 MG/3ML IN NEBU
0.6300 mg | INHALATION_SOLUTION | Freq: Four times a day (QID) | RESPIRATORY_TRACT | Status: DC | PRN
  Administered 2022-05-21: 0.63 mg via RESPIRATORY_TRACT
  Filled 2022-05-21: qty 3

## 2022-05-21 MED ORDER — HEPARIN BOLUS VIA INFUSION
3000.0000 [IU] | Freq: Once | INTRAVENOUS | Status: AC
  Administered 2022-05-21: 3000 [IU] via INTRAVENOUS
  Filled 2022-05-21: qty 3000

## 2022-05-21 MED ORDER — CLOPIDOGREL BISULFATE 75 MG PO TABS
75.0000 mg | ORAL_TABLET | Freq: Every day | ORAL | Status: DC
  Administered 2022-05-21 – 2022-05-28 (×7): 75 mg via ORAL
  Filled 2022-05-21 (×7): qty 1

## 2022-05-21 MED ORDER — DICLOFENAC SODIUM 1 % EX GEL: 2.0000 g | Freq: Three times a day (TID) | CUTANEOUS | Status: DC | PRN

## 2022-05-21 MED ORDER — METOPROLOL SUCCINATE 12.5 MG HALF TABLET
12.5000 mg | ORAL_TABLET | Freq: Every day | ORAL | Status: DC
  Administered 2022-05-22: 12.5 mg via ORAL
  Filled 2022-05-21: qty 1

## 2022-05-21 MED ORDER — LOSARTAN POTASSIUM 50 MG PO TABS
25.0000 mg | ORAL_TABLET | Freq: Every day | ORAL | Status: DC
  Administered 2022-05-21: 25 mg via ORAL
  Filled 2022-05-21: qty 1

## 2022-05-21 MED ORDER — MIDODRINE HCL 5 MG PO TABS
5.0000 mg | ORAL_TABLET | Freq: Three times a day (TID) | ORAL | Status: AC
  Administered 2022-05-22 (×2): 5 mg via ORAL
  Filled 2022-05-21 (×4): qty 1

## 2022-05-21 MED ORDER — INSULIN GLARGINE-YFGN 100 UNIT/ML ~~LOC~~ SOLN
10.0000 [IU] | SUBCUTANEOUS | Status: DC
  Administered 2022-05-21: 10 [IU] via SUBCUTANEOUS
  Filled 2022-05-21 (×2): qty 0.1

## 2022-05-21 MED ORDER — ALPRAZOLAM 0.25 MG PO TABS
0.2500 mg | ORAL_TABLET | Freq: Once | ORAL | Status: AC
  Administered 2022-05-21: 0.25 mg via ORAL
  Filled 2022-05-21: qty 1

## 2022-05-21 MED ORDER — DULOXETINE HCL 30 MG PO CPEP
30.0000 mg | ORAL_CAPSULE | Freq: Every day | ORAL | Status: DC
  Administered 2022-05-22 – 2022-05-31 (×9): 30 mg via ORAL
  Filled 2022-05-21 (×10): qty 1

## 2022-05-21 MED ORDER — ORAL CARE MOUTH RINSE: 15.0000 mL | OROMUCOSAL | Status: DC | PRN

## 2022-05-21 MED ORDER — MOMETASONE FURO-FORMOTEROL FUM 100-5 MCG/ACT IN AERO
2.0000 | INHALATION_SPRAY | Freq: Two times a day (BID) | RESPIRATORY_TRACT | Status: DC
  Administered 2022-05-22 – 2022-06-01 (×17): 2 via RESPIRATORY_TRACT
  Filled 2022-05-21 (×2): qty 8.8

## 2022-05-21 NOTE — ED Notes (Signed)
RT ran VBG on pt with the following results. Results given to MD Palumbo.     Latest Reference Range & Units 05/21/22 00:56  Sample type  VENOUS  pH, Ven 7.25 - 7.43  7.213 (L)  pCO2, Ven 44 - 60 mmHg 46.9  pO2, Ven 32 - 45 mmHg 32  TCO2 22 - 32 mmol/L 20 (L)  Acid-base deficit 0.0 - 2.0 mmol/L 9.0 (H)  Bicarbonate 20.0 - 28.0 mmol/L 18.9 (L)  O2 Saturation % 49  Patient temperature  98.5 F  Collection site  IV start  (L): Data is abnormally low (H): Data is abnormally high

## 2022-05-21 NOTE — Progress Notes (Addendum)
eLink Physician-Brief Progress Note Patient Name: [name] DOB: March 03, 1970 MRN: [id number]   Date of Service  05/21/2022  HPI/Events of Note  Notified of SBP dropping to the 70s.  She did get lasix earlier, although I/O still shows her to be +1L   eICU Interventions  Pt to be given fluid challenge with NS.  Will monitor response.         Cambryn Charters M DELA CRUZ 05/21/2022, 8:32 PM  9:29 PM SBP remains <90. Pt remains awake, alert, not in distress.  RN also states that the pt had received slightly bigger dose of metoprolol (25mg  instead of her usual dose of 12mg ).  Pt states that she had prolonged hypotension when this happened before.  Will give second fluid bolus in the form of albumin.  Pt without reliable venous access, which precludes use of pressors.  Will give trial of midodrine as well. Will see if this would help raise pt's BP.   10:13 PM BP had recovered, pt had not needed albumin or midodrine.  Pt now complaining of abdominal discomfort, nausea, which was her chief complaint.  PRN phenergan has already been ordered.  Trial of ativan 0.5mg  IV ordered for anxiety. Will monitor response.   3:52 AM PT had gotten up to use the bathroom, after which her BP was noted to be low, with SBP in 60-70s. HR 92 Despite hypotension, pt awake, alert, oriented, talking complete sentences without complaints.  Had given midodrine and albumin which had been ordered earlier in the shift.  BP remains low however. Will check manual cuff pressure to try and see if BP readings are accurate.  If with persistently low BP by cuff, will again give more volume, and may need to start vasopressor support.

## 2022-05-21 NOTE — Progress Notes (Addendum)
ANTICOAGULATION CONSULT NOTE - Initial Consult  Pharmacy Consult for Heparin Indication: Elevated Troponin  Allergies  Allergen Reactions   Atorvastatin Other (See Comments)    Myalgias    Crestor [Rosuvastatin Calcium] Other (See Comments)    Severe myalgias and joint pain   Monosodium Glutamate Nausea And Vomiting and Other (See Comments)    MSG - migraine   Zetia [Ezetimibe] Other (See Comments)    myalgias   Ciprofloxin Hcl [Ciprofloxacin] Nausea And Vomiting and Other (See Comments)    Severe migraine   Food Color Red [Red Dye] Diarrhea    Patient Measurements: Height: 5\' 2"  (157.5 cm) Weight: 64.3 kg (141 lb 12.1 oz) IBW/kg (Calculated) : 50.1 Heparin Dosing Weight: 64.3 kg  Vital Signs: Temp: 98.1 F (36.7 C) (04/28 0320) Temp Source: Oral (04/28 0320) BP: 92/69 (04/28 0400) Pulse Rate: 124 (04/28 0400)  Labs: Recent Labs    05/20/22 1923 05/20/22 1929 05/20/22 2324 05/20/22 2337 05/21/22 0056 05/21/22 0248  HGB 12.0 12.9  --   --  11.9*  --   HCT 35.8* 38.0  --   --  35.0*  --   PLT 477*  --   --   --   --   --   CREATININE 1.02*  --  0.95  --   --  1.06*  TROPONINIHS  --   --   --  15  --  300*    Estimated Creatinine Clearance: 54.1 mL/min (A) (by C-G formula based on SCr of 1.06 mg/dL (H)).   Medical History: Past Medical History:  Diagnosis Date   Allergy    Anemia    Anxiety    Asthma    CAD S/P percutaneous coronary angioplasty 03/28/2017   Cataract    CHF (congestive heart failure) (HCC)    Coronary artery disease    a. s/p PTCA DES x3 in the LAD (ostial DES placed, mid DES placed, distal DES placed) and PTCA/DES x1 to intermediate branch 02/2017. b. ACS 02/08/18 with LCx stent placement.   Depression    Diabetes mellitus    type 1   DKA (diabetic ketoacidoses) 12/31/2017   DKA (diabetic ketoacidosis) (HCC) 01/13/2022   Dyspnea    with exertion and dust, no oxygen   Elevated platelet count    GERD (gastroesophageal reflux disease)     Heart murmur    never caused any problems   High cholesterol    Hypothyroidism    Ischemic cardiomyopathy    a. EF low-normal with basal inferior akinesis, basal septal hypokinesis by echo 01/2018.   Myocardial infarction [facility]) 2019   Nausea and vomiting 12/04/2010   Non-ST elevation (NSTEMI) myocardial infarction [facility]) 02/08/2018   NSTEMI (non-ST elevated myocardial infarction) (HCC)    Substance abuse (HCC)    marijuana    Assessment: Jennifer Hogan is a 52 yo female who presented on 4/27 with epigastric pain and vomiting. Patient have  a history of HFrEF 45%, type 1 diabetes, CAD, and HTN. Patient was tachycardic and hyperglycemic on admission. Concerns for DKA. Taking DAPT with plavix and aspirin PTA.  Troponin Level increased from 15 on 05/20/22 to 300 on 05/21/22. EKG with nonspecific ST/T changes.   HgB WNL.  Goal of Therapy:  Heparin level 0.3-0.7 units/ml Monitor platelets by anticoagulation protocol: Yes   Plan:  Heparin 3000 units IV x1, then Heparin 800 units/hr  Heparin level in 8 hours  Monitor for s/sx of bleeding  Jennifer Hogan D Jennifer Hogan 05/21/2022,4:44 AM

## 2022-05-21 NOTE — Progress Notes (Signed)
ANTICOAGULATION CONSULT NOTE - Follow Up Consult  Pharmacy Consult for Heparin Indication: elevated troponin  Allergies  Allergen Reactions   Atorvastatin Other (See Comments)    Myalgias    Crestor [Rosuvastatin Calcium] Other (See Comments)    Severe myalgias and joint pain   Monosodium Glutamate Nausea And Vomiting and Other (See Comments)    MSG - migraine   Zetia [Ezetimibe] Other (See Comments)    myalgias   Ciprofloxin Hcl [Ciprofloxacin] Nausea And Vomiting and Other (See Comments)    Severe migraine   Food Color Red [Red Dye] Diarrhea    Patient Measurements: Height: 5\' 2"  (157.5 cm) Weight: 64.3 kg (141 lb 12.1 oz) IBW/kg (Calculated) : 50.1 Heparin Dosing Weight: 64.3 kg  Vital Signs: Temp: 99.6 F (37.6 C) (04/28 1520) Temp Source: Oral (04/28 1520) BP: 108/88 (04/28 1500) Pulse Rate: 130 (04/28 1600)  Labs: Recent Labs    05/20/22 1923 05/20/22 1929 05/20/22 2324 05/20/22 2337 05/21/22 0056 05/21/22 0248 05/21/22 0736 05/21/22 0938 05/21/22 1134 05/21/22 1630  HGB 12.0 12.9  --   --  11.9*  --   --   --   --   --   HCT 35.8* 38.0  --   --  35.0*  --   --   --   --   --   PLT 477*  --   --   --   --   --   --   --   --   --   HEPARINUNFRC  --   --   --   --   --   --   --   --   --  0.39  CREATININE 1.02*  --  0.95  --   --  1.06* 0.97  --   --   --   TROPONINIHS  --   --   --    < >  --  300*  --  1,792* 2,087*  --    < > = values in this interval not displayed.    Estimated Creatinine Clearance: 59.1 mL/min (by C-G formula based on SCr of 0.97 mg/dL).   Medications:  Scheduled:   aspirin EC  81 mg Oral QHS   buPROPion  150 mg Oral [id number]   Chlorhexidine Gluconate Cloth  6 each Topical Daily   clopidogrel  75 mg Oral QHS   diphenhydrAMINE  25-50 mg Oral QHS   DULoxetine  30 mg Oral QHS   insulin aspart  0-15 Units Subcutaneous TID WC   insulin aspart  0-5 Units Subcutaneous QHS   insulin glargine-yfgn  10 Units Subcutaneous Q24H    levothyroxine  100 mcg Oral Q0600   losartan  25 mg Oral Daily   metoprolol succinate  25 mg Oral Daily   mometasone-formoterol  2 puff Inhalation BID   montelukast  10 mg Oral QHS   pantoprazole  40 mg Oral BID   Infusions:   sodium chloride     cefTRIAXone (ROCEPHIN)  IV Stopped (05/21/22 0943)   heparin 800 Units/hr (05/21/22 1600)   promethazine (PHENERGAN) injection (IM or IVPB) Stopped (05/21/22 1541)   PRN: sodium chloride, acetaminophen, dextrose, diclofenac Sodium, fluticasone, levalbuterol, mouth rinse, polyethylene glycol, promethazine (PHENERGAN) injection (IM or IVPB)  Assessment: 52 yo female who presented on 4/27 with epigastric pain and vomiting. Patient have  a history of HFrEF 45%, type 1 diabetes, CAD, and HTN. Patient was tachycardic and hyperglycemic on admission. Concerns for DKA. Taking DAPT with  plavix and aspirin PTA.   Troponin Level increased from 15 on 05/20/22 to 300 on 05/21/22. EKG with nonspecific ST/T changes. Pharmacy consulted to dose IV heparin, plan x 48hr.  First heparin level therapeutic at 0.39 on 800 units/hr. No bleeding or complications reported.  Goal of Therapy:  Heparin level 0.3-0.7 units/ml Monitor platelets by anticoagulation protocol: Yes   Plan:  Continue heparin infusion at 800 units/hr Check heparin level in 6hrs to confirm therapeutic Daily heparin level and CBC Monitor for signs/symptoms of bleeding  Loralee Pacas, PharmD, BCPS Please see amion for complete clinical pharmacist phone list 05/21/2022,5:05 PM

## 2022-05-21 NOTE — Progress Notes (Signed)
NAME:  Jennifer Hogan, MRN:  [id number], DOB:  1970/08/22, LOS: 0 ADMISSION DATE:  05/20/2022 REFERRING MD:  EDP, CHIEF COMPLAINT:  vomiting   History of Present Illness:  52 year old woman w/ hx of DM1 on insulin pump, CAD post CABG presenting with N/V and severe hyperglycemia.   Improved with aggressive hydration at [facility] then became more SOB and tachycardic so PCCM consulted.  VBG prior to transfer 7.22.  She appears well. Only new things are she switched insulin pump brands last week and did get a TDAP shot this week.  Otherwise pan ROS neg.  Pertinent  Medical History  DM1 CAD post CABG GERD Allergy Asthma Depression HLD  Significant Hospital Events: Including procedures, antibiotic start and stop dates in addition to other pertinent events   4/28 admitted with nausea/vomiting and severe hyperglycemia in the setting of DKA due to question insulin pump failure.  Early morning patient seen with new significant oxygen requirement from room air to 10L with associated tachycardia and posterior crackles concerning for volume overload/pulmonary edema  Interim History / Subjective:  Continues to report nausea/vomiting with epigastric abdominal pain  Objective   Blood pressure 133/88, pulse (!) 149, temperature 98 F (36.7 C), temperature source Oral, resp. rate (!) 31, height 5\' 2"  (1.575 m), weight 64.3 kg, last menstrual period 04/21/2022, SpO2 91 %.        Intake/Output Summary (Last 24 hours) at 05/21/2022 1191 Last data filed at 05/21/2022 0913 Gross per 24 hour  Intake 4084.86 ml  Output 850 ml  Net 3234.86 ml    Filed Weights   05/21/22 0407  Weight: 64.3 kg    Examination: General: Acute ill-appearing middle-aged female lying in bed in no acute distress HEENT: [place]/AT, MM pink/moist, PERRL,  Neuro: Alert and oriented x 3, nonfocal CV: s1s2 regular rate and rhythm, no murmur, rubs, or gallops,  PULM: Posterior crackles, no increased work of breathing, now on 10  L high flow nasal cannula GI: soft, bowel sounds active in all 4 quadrants, non-tender, non-distended Extremities: warm/dry, no edema  Skin: no rashes or lesions  Resolved Hospital Problem list   N/A  Assessment & Plan:   Mild diabetic ketoacidosis  History of type 1 diabetes  -pH 7.21, CO2 17, Anion Gap 19, Metal status Alert  metting criteria for Mild DKA. Likley secondary to pump failure  P: Aggressive IV hydration provided on admit now with signs of overload, stop fluids  Insulin drip on admit will transition to SSI this am with closed gap and improved beta hydroxy Closely monitor patient's electrolytes  Maintain potassium greater than 4 Accu-Cheks no ACHS Heart healthy carb mod diet  Resume home insulin regiment/pump when  appropriate  Nausea/vomiting with longstanding diabetic gastroparesis -CT ABD negative on admit, lipase negative  GERD, hiatal hernia P: PRN antiemetics  Continue Reglan   Chronic HFrEF, ischemic cardiomyopathy CAD with prior CABG 10/2018 NSTEMI -Per chart review patient had several admission and similar to current with DKA and NSTEMI on admission complicated by acute respiratory failure and pulmonary edema.   -Echocardiogram December 2023 EF 45 to 50% with mildly decreased LV function, normal RV function -High-sensitivity troponin 300 on admission Tachycardia P: Diurese today given signs of pulmonary edema post IV hydration for DKA ASA and Plavix Cardiology consult  Resume home beta-blocker Limited echo ordered Heparin drip x 48 hours Trend troponin Continuous telemetry Daily weight Closely monitor renal function Optimize electrolytes Goal-directed medical therapy as able  Anxiety P: Continue  home Welbutrin   Hypothyroidism  P: Continue home synthroid   Best Practice (right click and "Reselect all SmartList Selections" daily)   Diet/type: NPO DVT prophylaxis: systemic dose LMWH GI prophylaxis: PPI Lines: N/A Foley:  N/A Code  Status:  full code Last date of multidisciplinary goals of care discussion: Continue to update patient daily   Critical care time:   CRITICAL CARE Performed by: Trianna Lupien D. Harris  Total critical care time: 42 minutes  Critical care time was exclusive of separately billable procedures and treating other patients.  Critical care was necessary to treat or prevent imminent or life-threatening deterioration.  Critical care was time spent personally by me on the following activities: development of treatment plan with patient and/or surrogate as well as nursing, discussions with consultants, evaluation of patient's response to treatment, examination of patient, obtaining history from patient or surrogate, ordering and performing treatments and interventions, ordering and review of laboratory studies, ordering and review of radiographic studies, pulse oximetry and re-evaluation of patient's condition.  Mckenize Mezera D. Harris, NP-C [facility] Personal contact information can be found on Amion  If no contact or response made please call [phone] 05/21/2022, 10:54 AM

## 2022-05-21 NOTE — Progress Notes (Signed)
eLink Physician-Brief Progress Note Patient Name: Jennifer Hogan DOB: 06/12/1970 MRN: [id number]   Date of Service  05/21/2022  HPI/Events of Note  53 year old female with history of coronary artery disease who presented with DKA.  Troponin bumped from 15-300.  Patient without chest pain.  EKG with nonspecific ST/T changes.  Already on dual antiplatelet therapy with aspirin and clopidogrel.  eICU Interventions  Start IV unfractionated heparin for elevated troponin.  Cardiology eval in the a.m.     Intervention Category Evaluation Type: Other  Carilyn Goodpasture 05/21/2022, 4:36 AM

## 2022-05-21 NOTE — H&P (Signed)
NAME:  Jennifer Hogan, MRN:  [id number], DOB:  [birth year], LOS: 0 ADMISSION DATE:  05/20/2022 REFERRING MD:  EDP, CHIEF COMPLAINT:  vomiting   History of Present Illness:  52 year old woman w/ hx of DM1 on insulin pump, CAD post CABG presenting with N/V and severe hyperglycemia.   Improved with aggressive hydration at [facility] then became more SOB and tachycardic so PCCM consulted.  VBG prior to transfer 7.22.  She appears well. Only new things are she switched insulin pump brands last week and did get a TDAP shot this week.  Otherwise pan ROS neg.  Pertinent  Medical History  DM1 CAD post CABG GERD Allergy Asthma Depression HLD  Significant Hospital Events: Including procedures, antibiotic start and stop dates in addition to other pertinent events     Interim History / Subjective:  Consulted  Objective   Blood pressure 136/65, pulse (!) 142, temperature 98.5 F (36.9 C), temperature source Oral, resp. rate (!) 28, last menstrual period 04/21/2022, SpO2 95 %.        Intake/Output Summary (Last 24 hours) at 05/21/2022 0150 Last data filed at 05/21/2022 0032 Gross per 24 hour  Intake 1740.11 ml  Output --  Net 1740.11 ml   There were no vitals filed for this visit.  Examination: General: slightly pale no distress on RA HENT: MM dry trachea midline Lungs: clear, no wheezing Cardiovascular: Tachy, sinus on monitor Abdomen: soft, +BS Extremities: no edema Neuro: Moves ext to command Psych: RASS 0  Lipase neg Cr neg WBC mildly up Urine concentrated w/ ketones CT a/p neg  Resolved Hospital Problem list   N/A  Assessment & Plan:  DKA- triggered by ?new insulin pump or ?TDAP.   No infectious symptoms.  Labs relatively benign.  Tachypnea and tachycardia improved markedly once she got some rest. DM1 Longstanding diabetic gastroparesis GERD, hiatal hernia CAD with prior CABG  Overall seems improved with usual care, unclear why looked worse prior to transfer.   Will watch today and consider stepdown later if continues to improve.  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 - Home med recs as ordered  [employer] (right click and "Reselect all SmartList Selections" daily)   Diet/type: NPO DVT prophylaxis: systemic dose LMWH GI prophylaxis: PPI Lines: N/A Foley:  N/A Code Status:  full code Last date of multidisciplinary goals of care discussion [N/A]  Labs   CBC: Recent Labs  Lab 05/20/22 1923 05/20/22 1929 05/21/22 0056  WBC 13.6*  --   --   NEUTROABS 12.4*  --   --   HGB 12.0 12.9 11.9*  HCT 35.8* 38.0 35.0*  MCV 88.8  --   --   PLT 477*  --   --     Basic Metabolic Panel: Recent Labs  Lab 05/20/22 1923 05/20/22 1929 05/20/22 2324 05/21/22 0056  NA 136 136 135 136  K 3.7 3.7 4.0 4.7  CL 100  --  101  --   CO2 17*  --  16*  --   GLUCOSE 331*  --  219*  --   BUN 22*  --  20  --   CREATININE 1.02*  --  0.95  --   CALCIUM 9.7  --  9.5  --    GFR: Estimated Creatinine Clearance: 60.3 mL/min (by C-G formula based on SCr of 0.95 mg/dL). Recent Labs  Lab  05/20/22 1923 05/20/22 2338  WBC 13.6*  --   LATICACIDVEN  --  3.4*    Liver Function Tests: Recent Labs  Lab 05/20/22 1923  AST 28  ALT 19  ALKPHOS 84  BILITOT 0.7  PROT 8.0  ALBUMIN 4.5   Recent Labs  Lab 05/20/22 1923  LIPASE <10*   No results for input(s): "AMMONIA" in the last 168 hours.  ABG    Component Value Date/Time   PHART 7.43 05/13/2021 1130   PCO2ART 22 (L) 05/13/2021 1130   PO2ART 101 05/13/2021 1130   HCO3 18.9 (L) 05/21/2022 0056   TCO2 20 (L) 05/21/2022 0056   ACIDBASEDEF 9.0 (H) 05/21/2022 0056   O2SAT 49 05/21/2022 0056     Coagulation Profile: No results for input(s): "INR", "PROTIME" in the last 168 hours.  Cardiac Enzymes: No results for input(s): "CKTOTAL", "CKMB", "CKMBINDEX", "TROPONINI" in the last 168  hours.  HbA1C: Hemoglobin A1C  Date/Time Value Ref Range Status  01/04/2022 10:13 AM 8.0 (A) 4.0 - 5.6 % Final  07/01/2021 02:25 PM 8.0 (A) 4.0 - 5.6 % Final   Hgb A1c MFr Bld  Date/Time Value Ref Range Status  03/24/2022 04:46 AM 8.0 (H) 4.8 - 5.6 % Final    Comment:    (NOTE)         Prediabetes: 5.7 - 6.4         Diabetes: >6.4         Glycemic control for adults with diabetes: <7.0   10/01/2020 03:53 PM 8.5 (H) 4.8 - 5.6 % Final    Comment:    (NOTE) Pre diabetes:          5.7%-6.4%  Diabetes:              >6.4%  Glycemic control for   <7.0% adults with diabetes     CBG: Recent Labs  Lab 05/20/22 1836 05/20/22 2310 05/21/22 0009 05/21/22 0059  GLUCAP 344* 208* 279* 293*    Review of Systems:    Positive Symptoms in bold:  Constitutional fevers, chills, weight loss, fatigue, anorexia, malaise  Eyes decreased vision, double vision, eye irritation  Ears, Nose, Mouth, Throat sore throat, trouble swallowing, sinus congestion  Cardiovascular chest pain, paroxysmal nocturnal dyspnea, lower ext edema, palpitations   Respiratory SOB, cough, DOE, hemoptysis, wheezing  Gastrointestinal nausea, vomiting, diarrhea  Genitourinary burning with urination, trouble urinating  Musculoskeletal joint aches, joint swelling, back pain  Integumentary  rashes, skin lesions  Neurological focal weakness, focal numbness, trouble speaking, headaches  Psychiatric depression, anxiety, confusion  Endocrine polyuria, polydipsia, cold intolerance, heat intolerance  Hematologic abnormal bruising, abnormal bleeding, unexplained nose bleeds  Allergic/Immunologic recurrent infections, hives, swollen lymph nodes     Past Medical History:  She,  has a past medical history of Allergy, Anemia, Anxiety, Asthma, CAD S/P percutaneous coronary angioplasty (03/28/2017), Cataract, CHF (congestive heart failure) (HCC), Coronary artery disease, Depression, Diabetes mellitus, DKA (diabetic  ketoacidoses) (12/31/2017), DKA (diabetic ketoacidosis) (HCC) (01/13/2022), Dyspnea, Elevated platelet count, GERD (gastroesophageal reflux disease), Heart murmur, High cholesterol, Hypothyroidism, Ischemic cardiomyopathy, Myocardial infarction (HCC) (2019), Nausea and vomiting (12/04/2010), Non-ST elevation (NSTEMI) myocardial infarction [facility]) (02/08/2018), NSTEMI (non-ST elevated myocardial infarction) (HCC), and Substance abuse (HCC).   Surgical History:   Past Surgical History:  Procedure Laterality Date   25 GAUGE PARS PLANA VITRECTOMY WITH 20 GAUGE MVR PORT Right 11/24/2021   Procedure: 25 GAUGE PARS PLANA VITRECTOMY APPROACH RIGHT EYE, FLUID FLUID EXCHANGE;  Surgeon: Carmela Rima, MD;  Location: [facility]  OR;  Service: Ophthalmology;  Laterality: Right;   AIR/FLUID EXCHANGE Right 12/27/2021   Procedure: AIR/FLUID EXCHANGE;  Surgeon: Carmela Rima, MD;  Location: [facility] OR;  Service: Ophthalmology;  Laterality: Right;   APPENDECTOMY     BIOPSY  09/29/2021   Procedure: BIOPSY;  Surgeon: Benancio Deeds, MD;  Location: [facility];  Service: Gastroenterology;;   CATARACT EXTRACTION W/PHACO Right 11/24/2021   Procedure: CATARACT EXTRACTION AND INTRAOCULAR LENS PLACEMENT (IOC) RIGHT EYE;  Surgeon: Carmela Rima, MD;  Location: [facility] OR;  Service: Ophthalmology;  Laterality: Right;   COLONOSCOPY     COLONOSCOPY WITH PROPOFOL N/A 09/29/2021   Procedure: COLONOSCOPY WITH PROPOFOL;  Surgeon: Benancio Deeds, MD;  Location: [facility] ENDOSCOPY;  Service: Gastroenterology;  Laterality: N/A;   CORONARY ARTERY BYPASS GRAFT N/A 11/04/2018   Procedure: CORONARY ARTERY BYPASS GRAFTING (CABG), ON PUMP, TIMES THREE, USING LEFT AND RIGHT INTERNAL MAMMARY ARTERIES;  Surgeon: Linden Dolin, MD;  Location: [facility] OR;  Service: Open Heart Surgery;  Laterality: N/A;   CORONARY STENT INTERVENTION N/A 03/20/2017   Procedure: DES x 3 LAD, DES OM; Surgeon: Kathleene Hazel, MD;  Location: [facility] INVASIVE CV LAB;  Service:  Cardiovascular;  Laterality: N/A;   CORONARY/GRAFT ACUTE MI REVASCULARIZATION N/A 02/08/2018   Procedure: Coronary/Graft Acute MI Revascularization;  Surgeon: Lyn Records, MD;  Location: [facility] INVASIVE CV LAB;  Service: Cardiovascular;  Laterality: N/A;   ESOPHAGOGASTRODUODENOSCOPY (EGD) WITH PROPOFOL N/A 09/29/2021   Procedure: ESOPHAGOGASTRODUODENOSCOPY (EGD) WITH PROPOFOL;  Surgeon: Benancio Deeds, MD;  Location: [facility] ENDOSCOPY;  Service: Gastroenterology;  Laterality: N/A;   GAS INSERTION Right 04/28/2021   Procedure: INSERTION OF GAS ( C3F8);  Surgeon: Carmela Rima, MD;  Location: [facility] OR;  Service: Ophthalmology;  Laterality: Right;  Right eye   GAS/FLUID EXCHANGE Right 04/28/2021   Procedure: GAS/FLUID EXCHANGE;  Surgeon: Carmela Rima, MD;  Location: [facility] OR;  Service: Ophthalmology;  Laterality: Right;  Right eye   LASER PHOTO ABLATION Right 11/24/2021   Procedure: ENDO LASER PAN PHOTOCOAGULATION;  Surgeon: Carmela Rima, MD;  Location: [facility] OR;  Service: Ophthalmology;  Laterality: Right;   LASER PHOTO ABLATION Right 12/27/2021   Procedure: ENDO LASER;  Surgeon: Carmela Rima, MD;  Location: [facility] OR;  Service: Ophthalmology;  Laterality: Right;   LEFT HEART CATH AND CORONARY ANGIOGRAPHY N/A 03/20/2017   Procedure: LEFT HEART CATH AND CORONARY ANGIOGRAPHY;  Surgeon: Kathleene Hazel, MD;  Location: [facility] INVASIVE CV LAB;  Service: Cardiovascular;  Laterality: N/A;   LEFT HEART CATH AND CORONARY ANGIOGRAPHY N/A 02/08/2018   Procedure: LEFT HEART CATH AND CORONARY ANGIOGRAPHY;  Surgeon: Lyn Records, MD;  Location: [facility] INVASIVE CV LAB;  Service: Cardiovascular;  Laterality: N/A;   LEFT HEART CATH AND CORONARY ANGIOGRAPHY N/A 10/29/2018   Procedure: LEFT HEART CATH AND CORONARY ANGIOGRAPHY;  Surgeon: Kathleene Hazel, MD;  Location: [facility] INVASIVE CV LAB;  Service: Cardiovascular;  Laterality: N/A;   LEFT HEART CATH AND CORS/GRAFTS ANGIOGRAPHY N/A 10/04/2020   Procedure: LEFT  HEART CATH AND CORS/GRAFTS ANGIOGRAPHY;  Surgeon: Tonny Bollman, MD;  Location: [facility] INVASIVE CV LAB;  Service: Cardiovascular;  Laterality: N/A;   LEFT HEART CATH AND CORS/GRAFTS ANGIOGRAPHY N/A 05/11/2021   Procedure: LEFT HEART CATH AND CORS/GRAFTS ANGIOGRAPHY;  Surgeon: Swazil[place], Peter M, MD;  Location: [facility] INVASIVE CV LAB;  Service: Cardiovascular;  Laterality: N/A;   PARS PLANA VITRECTOMY Right 04/28/2021   Procedure: PARS PLANA VITRECTOMY WITH 25 GAUGE REMOVAL OF TRATIONAL MEMBRANE RIGHT EYE; DRAINAGE OF SUBRETINAL FLUID RIGHT  EYE;  Surgeon: Carmela Rima, MD;  Location: [facility] OR;  Service: Ophthalmology;  Laterality: Right;  Right eye   PARS PLANA VITRECTOMY 27 GAUGE Right 12/27/2021   Procedure: PARS PLANA VITRECTOMY 27 GAUGE;  Surgeon: Carmela Rima, MD;  Location: [facility] OR;  Service: Ophthalmology;  Laterality: Right;   PHOTOCOAGULATION WITH LASER Right 04/28/2021   Procedure: PHOTOCOAGULATION WITH LASER;  Surgeon: Carmela Rima, MD;  Location: [facility] OR;  Service: Ophthalmology;  Laterality: Right;  Right eye   POLYPECTOMY  09/29/2021   Procedure: POLYPECTOMY;  Surgeon: Benancio Deeds, MD;  Location: [facility] ENDOSCOPY;  Service: Gastroenterology;;   TEE WITHOUT CARDIOVERSION N/A 11/04/2018   Procedure: TRANSESOPHAGEAL ECHOCARDIOGRAM (TEE);  Surgeon: Linden Dolin, MD;  Location: [facility] OR;  Service: Open Heart Surgery;  Laterality: N/A;     Social History:   reports that she has quit smoking. Her smoking use included cigarettes. She has never used smokeless tobacco. She reports that she does not currently use alcohol. She reports current drug use. Drug: Marijuana.   Family History:  Her family history includes Anxiety disorder in her father; Asthma in her father; Cancer in her mother; Colon polyps in her father; Congestive Heart Failure in her father; Coronary artery disease in her father; Depression in her maternal grandmother; Diabetes in her paternal grandmother; Hypercalcemia in her father  and mother; Hypertension in her father and mother; OCD in her father. There is no history of Stomach cancer, Esophageal cancer, Colon cancer, or Rectal cancer.   Allergies Allergies  Allergen Reactions   Atorvastatin Other (See Comments)    Myalgias    Crestor [Rosuvastatin Calcium] Other (See Comments)    Severe myalgias and joint pain   Monosodium Glutamate Nausea And Vomiting and Other (See Comments)    MSG - migraine   Zetia [Ezetimibe] Other (See Comments)    myalgias   Ciprofloxin Hcl [Ciprofloxacin] Nausea And Vomiting and Other (See Comments)    Severe migraine   Food Color Red [Red Dye] Diarrhea     Home Medications  Prior to Admission medications   Medication Sig Start Date End Date Taking? Authorizing Provider  acetaminophen (TYLENOL) 325 MG tablet Take 650 mg by mouth daily as needed for mild pain, fever or headache.    [provider]  albuterol (VENTOLIN HFA) 108 (90 Base) MCG/ACT inhaler INHALE 2 PUFFS INTO THE LUNGS EVERY 6 (SIX) HOURS AS NEEDED FOR WHEEZING OR SHORTNESS OF BREATH. 10/25/21   Newlin, Odette Horns, MD  Alirocumab (PRALUENT) 150 MG/ML SOAJ Inject 1 pen  into the skin every 14 (fourteen) days. 09/23/21   Quintella Reichert, MD  aspirin 81 MG EC tablet Take 1 tablet (81 mg total) by mouth daily. Patient taking differently: Take 81 mg by mouth at bedtime. 12/07/17   Amin, Loura Halt, MD  b complex vitamins tablet Take 1 tablet by mouth 3 (three) times a week.    [provider]  budesonide (PULMICORT) 0.25 MG/2ML nebulizer solution Take 0.25 mg by nebulization daily as needed (shortness of breath/wheezing).    [provider]  buPROPion (WELLBUTRIN XL) 150 MG 24 hr tablet Take 1 tablet (150 mg total) by mouth every morning. 03/06/22 06/17/22  Karsten Ro, MD  clopidogrel (PLAVIX) 75 MG tablet Take 1 tablet (75 mg total) by mouth daily with breakfast. Patient taking differently: Take 75 mg by mouth at bedtime. 12/05/21   Quintella Reichert, MD   Continuous Blood Gluc Sensor (DEXCOM G7 SENSOR) MISC Apply 1 sensor every 10 days  01/04/22   Carlus Pavlov, MD  diclofenac Sodium (VOLTAREN) 1 % GEL Apply 2-4 g topically 3 (three) times daily as needed (knee pain).    [provider]  diphenhydrAMINE (BENADRYL) 25 MG tablet Take 37.5 mg by mouth at bedtime.    [provider]  DULoxetine (CYMBALTA) 30 MG capsule Take 1 capsule (30 mg total) by mouth at bedtime. 03/06/22   Karsten Ro, MD  fluticasone (FLONASE) 50 MCG/ACT nasal spray Insert 1 spray each nostril daily for 3 days then use 2-3 times a week as needed 05/16/22   [name], NP  furosemide (LASIX) 20 MG tablet Take 1 tablet (20 mg total) by mouth daily as needed (for weight gain). 10/14/20   Clegg, Amy D, NP  Glucagon 3 MG/DOSE POWD Place 3 mg into the nose once as needed for up to 1 dose. 07/01/21   Carlus Pavlov, MD  Insulin Disposable Pump (OMNIPOD 5 G6 INTRO, GEN 5,) KIT Use as directed. 05/03/22   Carlus Pavlov, MD  Insulin Disposable Pump (OMNIPOD 5 G6 PODS, GEN 5,) MISC Use every 3 (three) days. 05/03/22   Carlus Pavlov, MD  insulin lispro (HUMALOG) 100 UNIT/ML injection Inject 0.35 mLs (35 Units total) into the skin daily via pump. 03/08/22   Carlus Pavlov, MD  levothyroxine (SYNTHROID) 100 MCG tablet Take 1 tablet (100 mcg total) by mouth daily before breakfast. 04/10/22   Hoy Register, MD  losartan (COZAAR) 25 MG tablet Take 1 tablet (25 mg total) by mouth daily. 01/16/22   Pennie Banter, DO  meloxicam (MOBIC) 15 MG tablet Take 1 tablet (15 mg total) by mouth daily. 05/17/22   [name], NP  methylcellulose (CITRUCEL) oral powder Take 1 packet by mouth daily. 12/23/21   Armbruster, Willaim Rayas, MD  metoCLOPramide (REGLAN) 5 MG tablet Take 1 tablet (5 mg total) by mouth every 8 (eight) hours as needed for nausea or vomiting. 12/23/21 12/23/22  Benancio Deeds, MD  metoprolol succinate (TOPROL-XL) 25 MG 24 hr tablet Take 1 tablet  (25 mg total) by mouth daily. 01/16/22   Pennie Banter, DO  mometasone-formoterol (DULERA) 100-5 MCG/ACT AERO Inhale 2 puffs into the lungs 2 (two) times daily. 02/15/22   [name], NP  montelukast (SINGULAIR) 10 MG tablet Take 1 tablet (10 mg total) by mouth at bedtime. 02/15/22   [name], NP  Multiple Vitamin (MULTIVITAMIN WITH MINERALS) TABS tablet Take 1 tablet by mouth daily.    [provider]  neomycin-polymyxin b-dexamethasone (MAXITROL) 3.5-10000-0.1 OINT Place 1 Application into both eyes 2 (two) times daily. 03/18/22   [provider]  nitroGLYCERIN (NITROSTAT) 0.4 MG SL tablet Place 1 tablet (0.4 mg total) under the tongue every 5 (five) minutes as needed for chest pain. 10/14/20   Clegg, Amy D, NP  omeprazole (PRILOSEC OTC) 20 MG tablet Take 20 mg by mouth daily as needed (acid reflux).    [provider]  ondansetron (ZOFRAN) 4 MG tablet Take 1 tablet (4 mg total) by mouth daily as needed for nausea or vomiting. 03/24/22 03/24/23  Hollice Espy, MD  OVER THE COUNTER MEDICATION Take 3 tablets by mouth See admin instructions. [employer] Super Lysine Immune Support- Take 3 tablets by mouth once a day on an empty stomach    [provider]  pantoprazole (PROTONIX) 40 MG tablet Take 1 tablet (40 mg total) by mouth 2 (two) times daily. 03/24/22   Hollice Espy, MD  Critical care time: N/A

## 2022-05-21 NOTE — ED Notes (Signed)
Lactic Acid 3.4 Dr Anitra Lauth notified

## 2022-05-21 NOTE — Progress Notes (Signed)
[facility] Physician-Brief Progress Note Patient Name: Jennifer Hogan DOB: August 31, 1970 MRN: [id number]   Date of Service  05/21/2022  HPI/Events of Note  52 year old female transferred from an outside hospital for DKA management.  She was also said to be short of breath and tachycardic. Noted to be in DKA.  On 5 L nasal cannula with O2 sats 97%.  Resting comfortably in bed.  No complaints.  eICU Interventions  Treat with insulin and IV fluids per DKA protocol.  Transition to subcu insulin when anion gap normalizes.     Intervention Category Evaluation Type: New Patient Evaluation  Carilyn Goodpasture 05/21/2022, 2:32 AM

## 2022-05-21 NOTE — ED Notes (Signed)
Report given to [facility]. 

## 2022-05-22 ENCOUNTER — Inpatient Hospital Stay (HOSPITAL_COMMUNITY): Payer: Medicare Other

## 2022-05-22 DIAGNOSIS — R570 Cardiogenic shock: Secondary | ICD-10-CM | POA: Diagnosis not present

## 2022-05-22 DIAGNOSIS — J9601 Acute respiratory failure with hypoxia: Secondary | ICD-10-CM | POA: Diagnosis not present

## 2022-05-22 DIAGNOSIS — R0609 Other forms of dyspnea: Secondary | ICD-10-CM

## 2022-05-22 DIAGNOSIS — I2510 Atherosclerotic heart disease of native coronary artery without angina pectoris: Secondary | ICD-10-CM | POA: Diagnosis not present

## 2022-05-22 DIAGNOSIS — I5022 Chronic systolic (congestive) heart failure: Secondary | ICD-10-CM | POA: Diagnosis not present

## 2022-05-22 DIAGNOSIS — I214 Non-ST elevation (NSTEMI) myocardial infarction: Secondary | ICD-10-CM | POA: Diagnosis not present

## 2022-05-22 DIAGNOSIS — I2583 Coronary atherosclerosis due to lipid rich plaque: Secondary | ICD-10-CM

## 2022-05-22 DIAGNOSIS — R079 Chest pain, unspecified: Secondary | ICD-10-CM | POA: Diagnosis not present

## 2022-05-22 DIAGNOSIS — I255 Ischemic cardiomyopathy: Secondary | ICD-10-CM

## 2022-05-22 DIAGNOSIS — I5023 Acute on chronic systolic (congestive) heart failure: Secondary | ICD-10-CM

## 2022-05-22 LAB — GLUCOSE, CAPILLARY
Glucose-Capillary: 146 mg/dL — ABNORMAL HIGH (ref 70–99)
Glucose-Capillary: 158 mg/dL — ABNORMAL HIGH (ref 70–99)
Glucose-Capillary: 200 mg/dL — ABNORMAL HIGH (ref 70–99)
Glucose-Capillary: 215 mg/dL — ABNORMAL HIGH (ref 70–99)
Glucose-Capillary: 235 mg/dL — ABNORMAL HIGH (ref 70–99)

## 2022-05-22 LAB — BASIC METABOLIC PANEL
Anion gap: 18 — ABNORMAL HIGH (ref 5–15)
BUN: 22 mg/dL — ABNORMAL HIGH (ref 6–20)
CO2: 22 mmol/L (ref 22–32)
Calcium: 9 mg/dL (ref 8.9–10.3)
Chloride: 98 mmol/L (ref 98–111)
Creatinine, Ser: 1.67 mg/dL — ABNORMAL HIGH (ref 0.44–1.00)
GFR, Estimated: 36 mL/min — ABNORMAL LOW (ref >60)
Glucose, Bld: 292 mg/dL — ABNORMAL HIGH (ref 70–99)
Potassium: 3.6 mmol/L (ref 3.5–5.1)
Sodium: 138 mmol/L (ref 135–145)

## 2022-05-22 LAB — CBC
HCT: 32.7 % — ABNORMAL LOW (ref 36.0–46.0)
Hemoglobin: 10.8 g/dL — ABNORMAL LOW (ref 12.0–15.0)
MCH: 29.7 pg (ref 26.0–34.0)
MCHC: 33 g/dL (ref 30.0–36.0)
MCV: 89.8 fL (ref 80.0–100.0)
Platelets: 422 10*3/uL — ABNORMAL HIGH (ref 150–400)
RBC: 3.64 MIL/uL — ABNORMAL LOW (ref 3.87–5.11)
RDW: 14.3 % (ref 11.5–15.5)
WBC: 13.9 10*3/uL — ABNORMAL HIGH (ref 4.0–10.5)
nRBC: 0 % (ref 0.0–0.2)

## 2022-05-22 LAB — ECHOCARDIOGRAM LIMITED
Height: 62 in
S' Lateral: 3.7 cm
Weight: 2268.09 oz

## 2022-05-22 LAB — MAGNESIUM: Magnesium: 1.8 mg/dL (ref 1.7–2.4)

## 2022-05-22 LAB — HEPARIN LEVEL (UNFRACTIONATED)
Heparin Unfractionated: 0.23 IU/mL — ABNORMAL LOW (ref 0.30–0.70)
Heparin Unfractionated: 0.24 IU/mL — ABNORMAL LOW (ref 0.30–0.70)

## 2022-05-22 LAB — PHOSPHORUS: Phosphorus: 4 mg/dL (ref 2.5–4.6)

## 2022-05-22 LAB — CULTURE, BLOOD (ROUTINE X 2)
Culture: NO GROWTH
Special Requests: ADEQUATE

## 2022-05-22 LAB — TROPONIN I (HIGH SENSITIVITY): Troponin I (High Sensitivity): 5048 ng/L (ref <18)

## 2022-05-22 LAB — COOXEMETRY PANEL
Carboxyhemoglobin: 1.1 % (ref 0.5–1.5)
Methemoglobin: <0.7 % (ref 0.0–1.5)
O2 Saturation: 60.9 %
Total hemoglobin: 11.2 g/dL — ABNORMAL LOW (ref 12.0–16.0)

## 2022-05-22 LAB — LACTIC ACID, PLASMA: Lactic Acid, Venous: 1.6 mmol/L (ref 0.5–1.9)

## 2022-05-22 LAB — BRAIN NATRIURETIC PEPTIDE: B Natriuretic Peptide: >4500 pg/mL — ABNORMAL HIGH (ref 0.0–100.0)

## 2022-05-22 MED ORDER — INSULIN ASPART 100 UNIT/ML IJ SOLN: 0.0000 [IU] | Freq: Three times a day (TID) | INTRAMUSCULAR | Status: DC

## 2022-05-22 MED ORDER — ALBUMIN HUMAN 25 % IV SOLN
25.0000 g | Freq: Once | INTRAVENOUS | Status: AC
  Administered 2022-05-22: 25 g via INTRAVENOUS
  Filled 2022-05-22: qty 100

## 2022-05-22 MED ORDER — INSULIN GLARGINE-YFGN 100 UNIT/ML ~~LOC~~ SOLN
10.0000 [IU] | Freq: Every day | SUBCUTANEOUS | Status: DC
  Filled 2022-05-22: qty 0.1

## 2022-05-22 MED ORDER — MIDODRINE HCL 5 MG PO TABS
5.0000 mg | ORAL_TABLET | Freq: Once | ORAL | Status: AC
  Administered 2022-05-22: 5 mg via ORAL

## 2022-05-22 MED ORDER — MIDODRINE HCL 5 MG PO TABS
5.0000 mg | ORAL_TABLET | Freq: Three times a day (TID) | ORAL | Status: AC
  Administered 2022-05-22: 5 mg via ORAL

## 2022-05-22 MED ORDER — INSULIN ASPART 100 UNIT/ML IJ SOLN: 3.0000 [IU] | Freq: Three times a day (TID) | INTRAMUSCULAR | Status: DC

## 2022-05-22 MED ORDER — INSULIN GLARGINE-YFGN 100 UNIT/ML ~~LOC~~ SOLN
12.0000 [IU] | Freq: Every day | SUBCUTANEOUS | Status: DC
  Administered 2022-05-22 – 2022-05-23 (×2): 12 [IU] via SUBCUTANEOUS
  Filled 2022-05-22 (×2): qty 0.12

## 2022-05-22 MED ORDER — INSULIN ASPART 100 UNIT/ML IJ SOLN
0.0000 [IU] | INTRAMUSCULAR | Status: DC
  Administered 2022-05-23 (×2): 3 [IU] via SUBCUTANEOUS

## 2022-05-22 MED ORDER — HYDROMORPHONE HCL 1 MG/ML IJ SOLN
0.5000 mg | Freq: Once | INTRAMUSCULAR | Status: AC | PRN
  Administered 2022-05-23: 0.5 mg via INTRAVENOUS
  Filled 2022-05-22: qty 0.5

## 2022-05-22 MED ORDER — INSULIN ASPART 100 UNIT/ML IJ SOLN: 0.0000 [IU] | Freq: Once | INTRAMUSCULAR | Status: DC

## 2022-05-22 MED ORDER — INSULIN ASPART 100 UNIT/ML IJ SOLN
0.0000 [IU] | Freq: Three times a day (TID) | INTRAMUSCULAR | Status: AC
  Administered 2022-05-22: 1 [IU] via SUBCUTANEOUS

## 2022-05-22 MED ORDER — DIPHENHYDRAMINE HCL 25 MG PO CAPS
25.0000 mg | ORAL_CAPSULE | Freq: Every evening | ORAL | Status: DC | PRN
  Administered 2022-05-24: 25 mg via ORAL
  Filled 2022-05-22: qty 1

## 2022-05-22 MED ORDER — POTASSIUM CHLORIDE CRYS ER 20 MEQ PO TBCR: 40.0000 meq | EXTENDED_RELEASE_TABLET | Freq: Once | ORAL | Status: DC

## 2022-05-22 MED ORDER — INSULIN PUMP
SUBCUTANEOUS | Status: DC
  Administered 2022-05-22: 2 via SUBCUTANEOUS
  Filled 2022-05-22: qty 1

## 2022-05-22 MED ORDER — NOREPINEPHRINE 4 MG/250ML-% IV SOLN: 0.0000 ug/min | INTRAVENOUS | Status: DC

## 2022-05-22 MED ORDER — INSULIN ASPART 100 UNIT/ML IJ SOLN: 0.0000 [IU] | Freq: Every day | INTRAMUSCULAR | Status: DC

## 2022-05-22 MED ORDER — BUPROPION HCL ER (XL) 150 MG PO TB24
150.0000 mg | ORAL_TABLET | ORAL | Status: DC
  Administered 2022-05-22 – 2022-06-01 (×11): 150 mg via ORAL
  Filled 2022-05-22 (×12): qty 1

## 2022-05-22 MED ORDER — POTASSIUM CHLORIDE 20 MEQ PO PACK
40.0000 meq | PACK | Freq: Once | ORAL | Status: AC
  Administered 2022-05-22: 40 meq via ORAL
  Filled 2022-05-22: qty 2

## 2022-05-22 MED ORDER — MAGNESIUM SULFATE 2 GM/50ML IV SOLN
2.0000 g | Freq: Once | INTRAVENOUS | Status: AC
  Administered 2022-05-22: 2 g via INTRAVENOUS
  Filled 2022-05-22: qty 50

## 2022-05-22 NOTE — Inpatient Diabetes Management (Signed)
Inpatient Diabetes Program Recommendations  AACE/ADA: New Consensus Statement on Inpatient Glycemic Control (2015)  Target Ranges:  Prepandial:   less than 140 mg/dL      Peak postprandial:   less than 180 mg/dL (1-2 hours)      Critically ill patients:  140 - 180 mg/dL   Lab Results  Component Value Date   GLUCAP 235 (H) 05/22/2022   HGBA1C 8.0 (H) 03/24/2022    Review of Glycemic Control  Latest Reference Range & Units 05/21/22 20:34 05/21/22 21:49 05/21/22 23:22 05/22/22 00:49 05/22/22 07:37  Glucose-Capillary 70 - 99 mg/dL 409 (H) 811 (H) 914 (H) 215 (H) 235 (H)   Diabetes history: DM 1 Outpatient Diabetes medications:  Omnipod- basal rate total is 12.4 units/24 hours Unsure of CHO coverage/correction bolus Current orders for Inpatient glycemic control:  Novolog moderate tid with meals Semglee 10 units daily  Inpatient Diabetes Program Recommendations:    Assessed patient at bedside. She states that she put insulin pump on at 3 am today.  She is sleepy this morning but was able to show me the PDM that goes with her Omnipod.  It is currently administering 0.55 units/hr.  She has not bolused and blood sugar is 250 mg/dL.  Asked patient to bolus using insulin pump.  She administered 1.5 units of insulin for blood sugar of 250 mg/dL.  Will reassess in 1 hour to make sure blood sugar is responding.  Discussed with RN and pharmacist.  SQ insulin orders d/c'd and insulin pump order set placed by pharmacy.  Patient states that she was throwing up at home and was dehydrated which led to DKA.    Thanks,  Beryl Meager, RN, BC-ADM Inpatient Diabetes Coordinator Pager [phone]  (8a-5p)

## 2022-05-22 NOTE — Progress Notes (Signed)
Patient's BP dropped to 66/43 MAP 52. Patient sleeping, but when awaken says she feels no different. One time dose of 5mg  midodrine ordered and given.  Agrees she is now NPO at midnight for tomorrows procedure. All food and drink removed from bedside table.

## 2022-05-22 NOTE — Progress Notes (Signed)
ANTICOAGULATION CONSULT NOTE - Follow Up Consult  Pharmacy Consult for Heparin Indication: elevated troponin  Allergies  Allergen Reactions   Atorvastatin Other (See Comments)    Myalgias    Crestor [Rosuvastatin Calcium] Other (See Comments)    Severe myalgias and joint pain   Monosodium Glutamate Nausea And Vomiting and Other (See Comments)    MSG - migraine   Zetia [Ezetimibe] Other (See Comments)    myalgias   Ciprofloxin Hcl [Ciprofloxacin] Nausea And Vomiting and Other (See Comments)    Severe migraine   Food Color Red [Red Dye] Diarrhea    Patient Measurements: Height: 5\' 2"  (157.5 cm) Weight: 64.3 kg (141 lb 12.1 oz) IBW/kg (Calculated) : 50.1 Heparin Dosing Weight: 64.3 kg  Vital Signs: Temp: 99.2 F (37.3 C) (04/29 0740) Temp Source: Oral (04/29 0740) BP: 115/76 (04/29 0700) Pulse Rate: 121 (04/29 0700)  Labs: Recent Labs    05/20/22 1923 05/20/22 1929 05/20/22 2324 05/21/22 0056 05/21/22 0248 05/21/22 0736 05/21/22 0938 05/21/22 1134 05/21/22 1630 05/21/22 2249 05/22/22 0530  HGB 12.0 12.9  --  11.9*  --   --   --   --   --   --  10.8*  HCT 35.8* 38.0  --  35.0*  --   --   --   --   --   --  32.7*  PLT 477*  --   --   --   --   --   --   --   --   --  422*  HEPARINUNFRC  --   --   --   --   --   --   --   --  0.39 0.53 0.23*  CREATININE 1.02*  --    < >  --  1.06* 0.97  --   --   --   --  1.67*  TROPONINIHS  --   --    < >  --  300*  --  1,792* 2,087*  --   --  5,048*   < > = values in this interval not displayed.     Estimated Creatinine Clearance: 34.3 mL/min (A) (by C-G formula based on SCr of 1.67 mg/dL (H)).   Medications:  Scheduled:   aspirin EC  81 mg Oral QHS   buPROPion  150 mg Oral BH-q7a   Chlorhexidine Gluconate Cloth  6 each Topical Daily   clopidogrel  75 mg Oral QHS   diphenhydrAMINE  25-50 mg Oral QHS   DULoxetine  30 mg Oral QHS   insulin aspart  0-15 Units Subcutaneous TID WC   insulin aspart  0-5 Units Subcutaneous  QHS   insulin glargine-yfgn  10 Units Subcutaneous Q24H   levothyroxine  100 mcg Oral Q0600   losartan  25 mg Oral Daily   metoprolol succinate  12.5 mg Oral Daily   midodrine  5 mg Oral TID WC   mometasone-formoterol  2 puff Inhalation BID   montelukast  10 mg Oral QHS   pantoprazole  40 mg Oral BID   potassium chloride  40 mEq Oral Once   Infusions:   sodium chloride     cefTRIAXone (ROCEPHIN)  IV Stopped (05/21/22 0943)   heparin 800 Units/hr (05/22/22 0700)   promethazine (PHENERGAN) injection (IM or IVPB) Stopped (05/21/22 2302)   PRN: sodium chloride, acetaminophen, dextrose, diclofenac Sodium, fluticasone, levalbuterol, LORazepam, mouth rinse, polyethylene glycol, promethazine (PHENERGAN) injection (IM or IVPB)  Assessment: 52 yo female who presented on 4/27 with  epigastric pain and vomiting. Patient have  a history of HFrEF 45%, type 1 diabetes, CAD, and HTN. Patient was tachycardic and hyperglycemic on admission. Concerns for DKA. Taking DAPT with plavix and aspirin PTA.   Troponin Level increased from 15 on 05/20/22 to 300 on 05/21/22. EKG with nonspecific ST/T changes. Pharmacy consulted to dose IV heparin, plan x 48hr.  Heparin level 0.23 units/mL (subtherapeutic) on heparin 800 units/hr. No concerns with administration. No signs of bleeding.  Goal of Therapy:  Heparin level 0.3-0.7 units/ml Monitor platelets by anticoagulation protocol: Yes   Plan:  Increase heparin to 900 units/hr Check heparin level in 6 hours Daily CBC and heparin level Will f/u with CCM on end of therapy. 48hr will be complete 4/30 @0600   Eldridge Scot, PharmD Clinical Pharmacist 05/22/2022,8:02 AM

## 2022-05-22 NOTE — Progress Notes (Signed)
Patient checked her sugar and it is 235. Patient covered herself with 2 units and has basal rate going also. Patient states she feels fine and is lying in bed resting. Continuing to monitor patient closely.

## 2022-05-22 NOTE — Progress Notes (Signed)
ANTICOAGULATION CONSULT NOTE - Follow Up Consult  Pharmacy Consult for heparin Indication:  elevated troponin  Labs: Recent Labs    05/20/22 1923 05/20/22 1929 05/20/22 2324 05/20/22 2337 05/21/22 0056 05/21/22 0248 05/21/22 0736 05/21/22 0938 05/21/22 1134 05/21/22 1630 05/21/22 2249  HGB 12.0 12.9  --   --  11.9*  --   --   --   --   --   --   HCT 35.8* 38.0  --   --  35.0*  --   --   --   --   --   --   PLT 477*  --   --   --   --   --   --   --   --   --   --   HEPARINUNFRC  --   --   --   --   --   --   --   --   --  0.39 0.53  CREATININE 1.02*  --  0.95  --   --  1.06* 0.97  --   --   --   --   TROPONINIHS  --   --   --    < >  --  300*  --  1,792* 2,087*  --   --    < > = values in this interval not displayed.    Assessment/Plan:  52yo female remains therapeutic on heparin. Will continue infusion at current rate of 800 units/hr and monitor daily level.   Vernard Gambles, PharmD, BCPS  05/22/2022,12:48 AM

## 2022-05-22 NOTE — H&P (View-Only) (Signed)
 Cardiology Consultation   [name] MRN: [id number]; DOB: 04/08/1970  Admit date: 05/20/2022 Date of Consult: 05/22/2022  PCP:  Fleming, Zelda W, NP   [facility] Providers Cardiologist:  Traci Turner, MD     Patient Profile:   Jennifer Hogan is a 52 y.o. female with a hx of CAD status post CABG (LIMA to LAD/RI and RIMA to OM) '20, diabetes, hyperlipidemia who is being seen 05/22/2022 for the evaluation of elevated troponin at the request of Dr. Clark.  History of Present Illness:   Jennifer Hogan is a 52-year-old female with a past medical history noted above.  She has had multiple PCI's in the past.  Underwent CABG 10/2018 with LIMA to LAD/ramus intermedius and RIMA to OM.  Echocardiogram at that time showed normal LV function with trace mitral/tricuspid regurgitation.  She was admitted 09/2020 with DKA and a non-STEMI.  Hospital course was complicated by acute respiratory failure and pulmonary edema.  High-sensitivity troponin peaked at greater than 24,000. Echo during that admission showed a reduced EF of 20 to 25%.  Cardiac catheterization showed severe CAD with total occlusion of proximal LAD, moderate stenosis of intermediate branch and severe ostial circumflex in-stent stenosis, patent LIMA and RIMA.  She was diuresed with IV Lasix and discharged on metoprolol XL as well as spironolactone.  Admitted again 04/2021 with a non-STEMI and lactic acidosis.  Echo showed LVEF of 30 to 35%.  She underwent cardiac catheterization with no culprit lesion, severe two-vessel obstructive CAD but patent grafts, moderately elevated LVEDP.  He was seen in the advanced heart failure clinic on 10/2021.  She was continued on Toprol-XL 12.5 mg daily, off spironolactone secondary to hyperkalemia, no ARB started secondary to hyperkalemia and not a candidate for an SGLT2 given her DMI.  It was recommended that ICD be deferred until it was determined if her EF recovered with  GDMT.  In regards to her coronary disease she was continued on aspirin and Plavix, not on a statin secondary to myalgias but on PCSK9.  She was again admitted 12/2021 with DKA and elevated troponin.  No angina or acute EKG changes.  It was recommended she be treated medically.  Echocardiogram showed LVEF of 45 to 50%, no regional wall motion abnormality, normal RV size and function, trivial MR.  She presented to [facility] ED on 4/27 with complaints of nausea vomiting and severe hyperglycemia.  Reported she had switched to a new insulin pump.  She was admitted for DKA.   Admission labs showed sodium 136, potassium 4.7, creatinine 1.02, WBC 13.6, hemoglobin 12.  High-sensitivity troponin cycled 15>>300>>1792>>2087>>5048, lactic acid 3.4>>2.4>>2.4. EKG on admission showed sinus tachycardia with ST depression in inferior lateral leads, LAFB.  Subsequent EKGs with improvement in ST depression.  Echo 4/29 with LVEF of 20 to 25%, kinesis of the anterior septal wall and apex with overall severe LV dysfunction, question stress-induced cardiomyopathy, severely reduced RV with normal size, mildly dilated left atrium. Cardiology now asked to evaluate.   In talking with patient, she is somewhat lethargic but does awaken to answer questions mostly. Received ativan and phenergan earlier in the shift. She reports a GERD like sensation in her chest that started since she has been in the hospital. No shortness of breath, edema prior to admission. Reports she has been taking her other home medications.   Past Medical History:  Diagnosis Date   Allergy    Anemia    Anxiety    Asthma      CAD S/P percutaneous coronary angioplasty 03/28/2017   Cataract    CHF (congestive heart failure) (HCC)    Coronary artery disease    a. s/p PTCA DES x3 in the LAD (ostial DES placed, mid DES placed, distal DES placed) and PTCA/DES x1 to intermediate branch 02/2017. b. ACS 02/08/18 with LCx stent placement.   Depression    Diabetes  mellitus    type 1   DKA (diabetic ketoacidoses) 12/31/2017   DKA (diabetic ketoacidosis) (HCC) 01/13/2022   Dyspnea    with exertion and dust, no oxygen   Elevated platelet count    GERD (gastroesophageal reflux disease)    Heart murmur    never caused any problems   High cholesterol    Hypothyroidism    Ischemic cardiomyopathy    a. EF low-normal with basal inferior akinesis, basal septal hypokinesis by echo 01/2018.   Myocardial infarction (HCC) 2019   Nausea and vomiting 12/04/2010   Non-ST elevation (NSTEMI) myocardial infarction (HCC) 02/08/2018   NSTEMI (non-ST elevated myocardial infarction) (HCC)    Substance abuse (HCC)    marijuana    Past Surgical History:  Procedure Laterality Date   25 GAUGE PARS PLANA VITRECTOMY WITH 20 GAUGE MVR PORT Right 11/24/2021   Procedure: 25 GAUGE PARS PLANA VITRECTOMY APPROACH RIGHT EYE, FLUID FLUID EXCHANGE;  Surgeon: Patel, Narendra, MD;  Location: [facility];  Service: Ophthalmology;  Laterality: Right;   AIR/FLUID EXCHANGE Right 12/27/2021   Procedure: AIR/FLUID EXCHANGE;  Surgeon: Patel, Narendra, MD;  Location: [facility];  Service: Ophthalmology;  Laterality: Right;   APPENDECTOMY     BIOPSY  09/29/2021   Procedure: BIOPSY;  Surgeon: Armbruster, Steven P, MD;  Location: [facility];  Service: Gastroenterology;;   CATARACT EXTRACTION W/PHACO Right 11/24/2021   Procedure: CATARACT EXTRACTION AND INTRAOCULAR LENS PLACEMENT (IOC) RIGHT EYE;  Surgeon: Patel, Narendra, MD;  Location: [facility];  Service: Ophthalmology;  Laterality: Right;   COLONOSCOPY     COLONOSCOPY WITH PROPOFOL N/A 09/29/2021   Procedure: COLONOSCOPY WITH PROPOFOL;  Surgeon: Armbruster, Steven P, MD;  Location: [facility];  Service: Gastroenterology;  Laterality: N/A;   CORONARY ARTERY BYPASS GRAFT N/A 11/04/2018   Procedure: CORONARY ARTERY BYPASS GRAFTING (CABG), ON PUMP, TIMES THREE, USING LEFT AND RIGHT INTERNAL MAMMARY ARTERIES;  Surgeon: Atkins, Broadus Z, MD;  [facility]: [facility];   Service: Open Heart Surgery;  Laterality: N/A;   CORONARY STENT INTERVENTION N/A 03/20/2017   Procedure: DES x 3 LAD, DES OM; Surgeon: McAlhany, Christopher D, MD;  [facility];  Service: Cardiovascular;  Laterality: N/A;   CORONARY/GRAFT ACUTE MI REVASCULARIZATION N/A 02/08/2018   Procedure: Coronary/Graft Acute MI Revascularization;  Surgeon: Smith, Henry W, MD;  [facility];  Service: Cardiovascular;  Laterality: N/A;   ESOPHAGOGASTRODUODENOSCOPY (EGD) WITH PROPOFOL N/A 09/29/2021   Procedure: ESOPHAGOGASTRODUODENOSCOPY (EGD) WITH PROPOFOL;  Surgeon: Armbruster, Steven P, MD;  [facility]: [facility];  Service: Gastroenterology;  Laterality: N/A;   GAS INSERTION Right 04/28/2021   Procedure: INSERTION OF GAS ( C3F8);  Surgeon: Patel, Narendra, MD;  [facility];  Service: Ophthalmology;  Laterality: Right;  Right eye   GAS/FLUID EXCHANGE Right 04/28/2021   Procedure: GAS/FLUID EXCHANGE;  Surgeon: Patel, Narendra, MD;  [facility];  Service: Ophthalmology;  Laterality: Right;  Right eye   LASER PHOTO ABLATION Right 11/24/2021   Procedure: ENDO LASER PAN PHOTOCOAGULATION;  Surgeon: Patel, Narendra, MD;  [facility];  Service: Ophthalmology;  Laterality: Right;   LASER PHOTO ABLATION   Right 12/27/2021   Procedure: ENDO LASER;  Surgeon: Patel, Narendra, MD;  [facility];  Service: Ophthalmology;  Laterality: Right;   LEFT HEART CATH AND CORONARY ANGIOGRAPHY N/A 03/20/2017   Procedure: LEFT HEART CATH AND CORONARY ANGIOGRAPHY;  Surgeon: McAlhany, Christopher D, MD;  [facility];  Service: Cardiovascular;  Laterality: N/A;   LEFT HEART CATH AND CORONARY ANGIOGRAPHY N/A 02/08/2018   Procedure: LEFT HEART CATH AND CORONARY ANGIOGRAPHY;  Surgeon: Smith, Henry W, MD;  [facility];  Service: Cardiovascular;  Laterality: N/A;   LEFT HEART CATH AND CORONARY ANGIOGRAPHY N/A 10/29/2018   Procedure: LEFT HEART CATH AND CORONARY  ANGIOGRAPHY;  Surgeon: McAlhany, Christopher D, MD;  [facility];  Service: Cardiovascular;  Laterality: N/A;   LEFT HEART CATH AND CORS/GRAFTS ANGIOGRAPHY N/A 10/04/2020   Procedure: LEFT HEART CATH AND CORS/GRAFTS ANGIOGRAPHY;  Surgeon: Cooper, Michael, MD;  [facility];  Service: Cardiovascular;  Laterality: N/A;   LEFT HEART CATH AND CORS/GRAFTS ANGIOGRAPHY N/A 05/11/2021   Procedure: LEFT HEART CATH AND CORS/GRAFTS ANGIOGRAPHY;  Surgeon: Jordan, Peter M, MD;  [facility];  Service: Cardiovascular;  Laterality: N/A;   PARS PLANA VITRECTOMY Right 04/28/2021   Procedure: PARS PLANA VITRECTOMY WITH 25 GAUGE REMOVAL OF TRATIONAL MEMBRANE RIGHT EYE; DRAINAGE OF SUBRETINAL FLUID RIGHT EYE;  Surgeon: Patel, Narendra, MD;  [facility];  Service: Ophthalmology;  Laterality: Right;  Right eye   PARS PLANA VITRECTOMY 27 GAUGE Right 12/27/2021   Procedure: PARS PLANA VITRECTOMY 27 GAUGE;  Surgeon: Patel, Narendra, MD;  [facility];  Service: Ophthalmology;  Laterality: Right;   PHOTOCOAGULATION WITH LASER Right 04/28/2021   Procedure: PHOTOCOAGULATION WITH LASER;  Surgeon: Patel, Narendra, MD;  [facility];  Service: Ophthalmology;  Laterality: Right;  Right eye   POLYPECTOMY  09/29/2021   Procedure: POLYPECTOMY;  Surgeon: Armbruster, Steven P, MD;  [facility];  Service: Gastroenterology;;   TEE WITHOUT CARDIOVERSION N/A 11/04/2018   Procedure: TRANSESOPHAGEAL ECHOCARDIOGRAM (TEE);  Surgeon: Atkins, Broadus Z, MD;  [facility];  Service: Open Heart Surgery;  Laterality: N/A;     Home Medications:  Prior to Admission medications   Medication Sig Start Date End Date Taking? Authorizing Provider  acetaminophen (TYLENOL) 325 MG tablet Take 650 mg by mouth daily as needed for mild pain, fever or headache.    [provider]  albuterol (VENTOLIN HFA) 108 (90 Base) MCG/ACT inhaler INHALE 2 PUFFS INTO THE LUNGS EVERY 6 (SIX)  HOURS AS NEEDED FOR WHEEZING OR SHORTNESS OF BREATH. 10/25/21   Newlin, Enobong, MD  Alirocumab (PRALUENT) 150 MG/ML SOAJ Inject 1 pen  into the skin every 14 (fourteen) days. 09/23/21   Turner, Traci R, MD  aspirin 81 MG EC tablet Take 1 tablet (81 mg total) by mouth daily. Patient taking differently: Take 81 mg by mouth at bedtime. 12/07/17   Amin, Ankit Chirag, MD  b complex vitamins tablet Take 1 tablet by mouth 3 (three) times a week.    [provider]  budesonide (PULMICORT) 0.25 MG/2ML nebulizer solution Take 0.25 mg by nebulization daily as needed (shortness of breath/wheezing).    [provider]  buPROPion (WELLBUTRIN XL) 150 MG 24 hr tablet Take 1 tablet (150 mg total) by mouth every morning. 03/06/22 06/17/22  Doda, Vandana, MD  clopidogrel (PLAVIX) 75 MG tablet Take 1 tablet (75 mg total) by mouth daily with breakfast. Patient taking differently: Take 75 mg   by mouth at bedtime. 12/05/21   Turner, Traci R, MD  Continuous Blood Gluc Sensor (DEXCOM G7 SENSOR) MISC Apply 1 sensor every 10 days 01/04/22   Gherghe, Cristina, MD  diclofenac Sodium (VOLTAREN) 1 % GEL Apply 2-4 g topically 3 (three) times daily as needed (knee pain).    [provider]  diphenhydrAMINE (BENADRYL) 25 MG tablet Take 37.5 mg by mouth at bedtime.    [provider]  DULoxetine (CYMBALTA) 30 MG capsule Take 1 capsule (30 mg total) by mouth at bedtime. 03/06/22   Doda, Vandana, MD  fluticasone (FLONASE) 50 MCG/ACT nasal spray Insert 1 spray each nostril daily for 3 days then use 2-3 times a week as needed 05/16/22   Fleming, Zelda W, NP  furosemide (LASIX) 20 MG tablet Take 1 tablet (20 mg total) by mouth daily as needed (for weight gain). 10/14/20   Clegg, Amy D, NP  Glucagon 3 MG/DOSE POWD Place 3 mg into the nose once as needed for up to 1 dose. 07/01/21   Gherghe, Cristina, MD  Insulin Disposable Pump (OMNIPOD 5 G6 INTRO, GEN 5,) KIT Use as directed. 05/03/22   Gherghe, Cristina, MD   Insulin Disposable Pump (OMNIPOD 5 G6 PODS, GEN 5,) MISC Use every 3 (three) days. 05/03/22   Gherghe, Cristina, MD  insulin lispro (HUMALOG) 100 UNIT/ML injection Inject 0.35 mLs (35 Units total) into the skin daily via pump. 03/08/22   Gherghe, Cristina, MD  levothyroxine (SYNTHROID) 100 MCG tablet Take 1 tablet (100 mcg total) by mouth daily before breakfast. 04/10/22   Newlin, Enobong, MD  losartan (COZAAR) 25 MG tablet Take 1 tablet (25 mg total) by mouth daily. 01/16/22   Griffith, Kelly A, DO  meloxicam (MOBIC) 15 MG tablet Take 1 tablet (15 mg total) by mouth daily. 05/17/22   Fleming, Zelda W, NP  methylcellulose (CITRUCEL) oral powder Take 1 packet by mouth daily. 12/23/21   Armbruster, Steven P, MD  metoCLOPramide (REGLAN) 5 MG tablet Take 1 tablet (5 mg total) by mouth every 8 (eight) hours as needed for nausea or vomiting. 12/23/21 12/23/22  Armbruster, Steven P, MD  metoprolol succinate (TOPROL-XL) 25 MG 24 hr tablet Take 1 tablet (25 mg total) by mouth daily. 01/16/22   Griffith, Kelly A, DO  mometasone-formoterol (DULERA) 100-5 MCG/ACT AERO Inhale 2 puffs into the lungs 2 (two) times daily. 02/15/22   Fleming, Zelda W, NP  montelukast (SINGULAIR) 10 MG tablet Take 1 tablet (10 mg total) by mouth at bedtime. 02/15/22   Fleming, Zelda W, NP  Multiple Vitamin (MULTIVITAMIN WITH MINERALS) TABS tablet Take 1 tablet by mouth daily.    [provider]  neomycin-polymyxin b-dexamethasone (MAXITROL) 3.5-10000-0.1 OINT Place 1 Application into both eyes 2 (two) times daily. 03/18/22   [provider]  nitroGLYCERIN (NITROSTAT) 0.4 MG SL tablet Place 1 tablet (0.4 mg total) under the tongue every 5 (five) minutes as needed for chest pain. 10/14/20   Clegg, Amy D, NP  omeprazole (PRILOSEC OTC) 20 MG tablet Take 20 mg by mouth daily as needed (acid reflux).    [provider]  ondansetron (ZOFRAN) 4 MG tablet Take 1 tablet (4 mg total) by mouth daily as needed for nausea or  vomiting. 03/24/22 03/24/23  Krishnan, Sendil K, MD  OVER THE COUNTER MEDICATION Take 3 tablets by mouth See admin instructions. [employer] Super Lysine Immune Support- Take 3 tablets by mouth once a day on an empty stomach    [provider]    pantoprazole (PROTONIX) 40 MG tablet Take 1 tablet (40 mg total) by mouth 2 (two) times daily. 03/24/22   Krishnan, Sendil K, MD    Inpatient Medications: Scheduled Meds:  aspirin EC  81 mg Oral QHS   buPROPion  150 mg Oral BH-q7a   Chlorhexidine Gluconate Cloth  6 each Topical Daily   clopidogrel  75 mg Oral QHS   DULoxetine  30 mg Oral QHS   insulin pump   Subcutaneous Q4H   levothyroxine  100 mcg Oral Q0600   metoprolol succinate  12.5 mg Oral Daily   midodrine  5 mg Oral TID WC   mometasone-formoterol  2 puff Inhalation BID   montelukast  10 mg Oral QHS   pantoprazole  40 mg Oral BID   Continuous Infusions:  sodium chloride     cefTRIAXone (ROCEPHIN)  IV Stopped (05/22/22 0922)   heparin 900 Units/hr (05/22/22 1000)   promethazine (PHENERGAN) injection (IM or IVPB) Stopped (05/22/22 0946)   PRN Meds: sodium chloride, acetaminophen, dextrose, diclofenac Sodium, diphenhydrAMINE, fluticasone, levalbuterol, LORazepam, mouth rinse, polyethylene glycol, promethazine (PHENERGAN) injection (IM or IVPB)  Allergies:    Allergies  Allergen Reactions   Atorvastatin Other (See Comments)    Myalgias    Crestor [Rosuvastatin Calcium] Other (See Comments)    Severe myalgias and joint pain   Monosodium Glutamate Nausea And Vomiting and Other (See Comments)    MSG - migraine   Zetia [Ezetimibe] Other (See Comments)    myalgias   Ciprofloxin Hcl [Ciprofloxacin] Nausea And Vomiting and Other (See Comments)    Severe migraine   Food Color Red [Red Dye] Diarrhea    Social History:   Social History   Socioeconomic History   Marital status: Divorced    Spouse name: Not on file   Number of children: 0   Years of education: Not on file    Highest education level: Doctorate  Occupational History   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 3 times weekly; reports decreasing use   Sexual activity: Not Currently    Birth control/protection: None  Other Topics Concern   Not on file  Social History Narrative   Not on file   Social Determinants of Health   Financial Resource Strain: High Risk (05/15/2022)   Overall Financial Resource Strain (CARDIA)    Difficulty of Paying Living Expenses: Very hard  Food Insecurity: Food Insecurity Present (05/15/2022)   Hunger Vital Sign    Worried About Running Out of Food in the Last Year: Often true    Ran Out of Food in the Last Year: Often true  Transportation Needs: Unmet Transportation Needs (05/15/2022)   PRAPARE - Transportation    Lack of Transportation (Medical): Yes    Lack of Transportation (Non-Medical): Yes  Physical Activity: Unknown (05/15/2022)   Exercise Vital Sign    Days of Exercise per Week: 0 days    Minutes of Exercise per Session: Not on file    Stress: Stress Concern Present (05/15/2022)   [employer] - Occupational Stress Questionnaire    Feeling of Stress : Very much  Social Connections: Socially Isolated (05/15/2022)   Social Connection and Isolation Panel [NHANES]    Frequency of Communication with Friends and Family: More than three times a week    Frequency of Social Gatherings with Friends and Family: Once a week    Attends Religious Services: Never    Active Member of Clubs or Organizations: No    Attends  Club or Organization Meetings: Not on file    Marital Status: Divorced  Intimate Partner Violence: Not At Risk (01/14/2022)   Humiliation, Afraid, Rape, and Kick questionnaire    Fear of Current or Ex-Partner: No    Emotionally Abused: No    Physically Abused: No    Sexually Abused: No    Family History:    Family History  Problem Relation Age of Onset   Cancer Mother        T cell lymphoma   Hypertension Mother    Hypercalcemia Mother    OCD Father    Anxiety disorder Father    Coronary artery disease Father    Congestive Heart Failure Father    Asthma Father    Hypercalcemia Father    Hypertension Father    Colon polyps Father    Depression Maternal Grandmother    Diabetes Paternal Grandmother    Stomach cancer Neg Hx    Esophageal cancer Neg Hx    Colon cancer Neg Hx    Rectal cancer Neg Hx      ROS:  Please see the history of present illness.   All other ROS reviewed and negative.     Physical Exam/Data:   Vitals:   05/22/22 0930 05/22/22 0945 05/22/22 0955 05/22/22 1000  BP: 126/88 (!) 133/90 (!) 133/90 130/87  Pulse: (!) 123 (!) 125 (!) 127 (!) 123  Resp: (!) 23 (!) 33  19  Temp:      TempSrc:      SpO2: 99% 99%  98%  Weight:      Height:        Intake/Output Summary (Last 24 hours) at 05/22/2022 1100 Last data filed at 05/22/2022 1000 Gross per 24 hour  Intake 1027.32 ml  Output 2550 ml  Net -1522.68 ml      05/22/2022    3:08 AM 05/21/2022    4:07 AM 05/17/2022   10:02 AM  Last 3 Weights  Weight (lbs) 141 lb 12.1 oz 141 lb 12.1 oz 141 lb 12.8 oz  Weight (kg) 64.3 kg 64.3 kg 64.32 kg     Body mass index is 25.93 kg/m.  General:  Well nourished, well developed, in no acute distress, lethargic but answers questions with stimulated  HEENT: normal Neck: no JVD Vascular: No carotid bruits; Distal pulses 2+ bilaterally Cardiac:  normal S1, S2; tachy; no murmur  Lungs:  poor respiratory effort Abd: soft, nontender, no hepatomegaly  Ext: no  edema Musculoskeletal:  No deformities, BUE and BLE strength normal and equal Skin: warm and dry  Neuro:  CNs 2-12 intact, no focal abnormalities noted Psych:  Normal affect   EKG:  The EKG was personally reviewed and demonstrates:  sinus tachycardia, initially with inferolateral ST depressions, but improved on follow up EKGs Telemetry:  Telemetry was personally reviewed and demonstrates:  Sinus Tachycardia   Relevant CV Studies:  Cath: 05/11/2021    Prox LAD to Mid LAD lesion is   100% stenosed.   Dist LAD-1 lesion is 70% stenosed.   Dist LAD-2 lesion is 70% stenosed.   Mid LAD lesion is 60% stenosed.   Ost Cx to Prox Cx lesion is 99% stenosed.   Prox Cx to Mid Cx lesion is 70% stenosed.   Ramus-1 lesion is 60% stenosed.   Ramus-2 lesion is 20% stenosed.   Ramus-3 lesion is 70% stenosed.   RPAV lesion is 60% stenosed.   Non-stenotic Mid LAD to Dist LAD lesion was previously treated.   LIMA graft was visualized by angiography and is normal in caliber.   RIMA graft was visualized by non-selective angiography and is normal in caliber.   The graft exhibits no disease.   LV end diastolic pressure is moderately elevated.   Severe 2 vessel obstructive CAD Patent sequential LIMA graft  to the ramus intermediate and distal LAD Patent RIMA to the distal LCx Moderately elevated LVEDP   Compared to September 2022 there is no significant angiographic change. Recommend continued medical therapy.    Diagnostic Dominance: Right   Echo: 05/22/2022  IMPRESSIONS     1. Only limited images obtained due to patient not cooperating; akinesis  of the anteroseptal wall and apex with overall severe LV dysfunction;  consider stress induced cardiomyopathy.   2. Left ventricular ejection fraction, by estimation, is 20 to 25%. The  left ventricle has severely decreased function. The left ventricle  demonstrates regional wall motion abnormalities (see scoring  diagram/findings for description).    3. Right ventricular systolic function is severely reduced. The right  ventricular size is normal.   4. Left atrial size was mildly dilated.   5. The mitral valve is normal in structure. Mild mitral valve  regurgitation.   6. The aortic valve is tricuspid. Aortic valve regurgitation is not  visualized. No aortic stenosis is present.   FINDINGS   Left Ventricle: Left ventricular ejection fraction, by estimation, is 20  to 25%. The left ventricle has severely decreased function. The left  ventricle demonstrates regional wall motion abnormalities. The left  ventricular internal cavity size was normal   in size. There is no left ventricular hypertrophy.   Right Ventricle: The right ventricular size is normal. Right ventricular  systolic function is severely reduced.   Left Atrium: Left atrial size was mildly dilated.   Right Atrium: Right atrial size was not well visualized.   Pericardium: There is no evidence of pericardial effusion.   Mitral Valve: The mitral valve is normal in structure. Mild mitral valve  regurgitation.   Tricuspid Valve: The tricuspid valve is normal in structure.   Aortic Valve: The aortic valve is tricuspid. Aortic valve regurgitation is  not visualized. No aortic stenosis is present.   Pulmonic Valve: The pulmonic valve was normal in structure.   Aorta: The aortic root is normal in size and structure.   Additional Comments: Only limited images obtained due to patient not  cooperating; akinesis of the anteroseptal wall and apex with overall  severe LV dysfunction; consider stress induced cardiomyopathy.   Laboratory Data:  High Sensitivity Troponin:   Recent Labs  Lab 05/20/22 2337 05/21/22 0248 05/21/22 0938 05/21/22 1134 05/22/22 0530  TROPONINIHS 15 300* 1,792* 2,087* 5,048*     Chemistry Recent Labs  Lab 05/21/22 0248 05/21/22 0736 05/22/22 0530  NA 135 136 138  K 3.9 4.0 3.6  CL 105 106 98  CO2 19* 19* 22  GLUCOSE 178* 156* 292*   BUN 17 15 22*  CREATININE   1.06* 0.97 1.67*  CALCIUM 8.8* 9.3 9.0  MG  --   --  1.8  GFRNONAA >60 >60 36*  ANIONGAP 11 11 18*    Recent Labs  Lab 05/20/22 1923  PROT 8.0  ALBUMIN 4.5  AST 28  ALT 19  ALKPHOS 84  BILITOT 0.7   Lipids  Recent Labs  Lab 05/21/22 0938  TRIG 92    Hematology Recent Labs  Lab 05/20/22 1923 05/20/22 1929 05/21/22 0056 05/22/22 0530  WBC 13.6*  --   --  13.9*  RBC 4.03  --   --  3.64*  HGB 12.0 12.9 11.9* 10.8*  HCT 35.8* 38.0 35.0* 32.7*  MCV 88.8  --   --  89.8  MCH 29.8  --   --  29.7  MCHC 33.5  --   --  33.0  RDW 13.7  --   --  14.3  PLT 477*  --   --  422*   Thyroid No results for input(s): "TSH", "FREET4" in the last 168 hours.  BNPNo results for input(s): "BNP", "PROBNP" in the last 168 hours.  DDimer No results for input(s): "DDIMER" in the last 168 hours.   Radiology/Studies:  DG Chest Port 1 View  Result Date: 05/21/2022 CLINICAL DATA:  Abdominal pain. EXAM: PORTABLE CHEST 1 VIEW COMPARISON:  03/23/2022. FINDINGS: The heart size and mediastinal contours are within normal limits. There is hyperinflation of the lungs with interstitial prominence bilaterally. No consolidation, effusion, or pneumothorax. Sternotomy wires are present over the midline. IMPRESSION: Hyperinflation of the lungs with interstitial prominence bilaterally slightly increased from the prior exam, may be infectious or inflammatory. Electronically Signed   By: Laura  Taylor M.D.   On: 05/21/2022 00:34   CT ABDOMEN PELVIS W CONTRAST  Result Date: 05/20/2022 CLINICAL DATA:  Acute abdominal pain with vomiting, initial encounter EXAM: CT ABDOMEN AND PELVIS WITH CONTRAST TECHNIQUE: Multidetector CT imaging of the abdomen and pelvis was performed using the standard protocol following bolus administration of intravenous contrast. RADIATION DOSE REDUCTION: This exam was performed according to the departmental dose-optimization program which includes automated exposure  control, adjustment of the mA and/or kV according to patient size and/or use of iterative reconstruction technique. CONTRAST:  100mL OMNIPAQUE IOHEXOL 300 MG/ML  SOLN COMPARISON:  03/23/2022 FINDINGS: Lower chest: No acute abnormality. Hepatobiliary: No focal liver abnormality is seen. No gallstones, gallbladder wall thickening, or biliary dilatation. Pancreas: Unremarkable. No pancreatic ductal dilatation or surrounding inflammatory changes. Spleen: Normal in size without focal abnormality. Adrenals/Urinary Tract: Adrenal glands are within normal limits. Kidneys show normal enhancement pattern. No renal calculi or obstructive changes are noted. Normal excretion is noted bilaterally. The bladder is well distended. Stomach/Bowel: Appendix has been surgically removed. No obstructive or inflammatory changes of the colon are noted. Small bowel and stomach are within normal limits. Vascular/Lymphatic: Atherosclerotic calcifications of the abdominal aorta are noted. No lymphadenopathy is seen. Reproductive: Uterus and bilateral adnexa are unremarkable. Other: No abdominal wall hernia or abnormality. No abdominopelvic ascites. Musculoskeletal: Degenerative changes of lumbar spine are noted. IMPRESSION: No acute abnormality noted to correspond with the given clinical history. Electronically Signed   By: Mark  Lukens M.D.   On: 05/20/2022 22:38     Assessment and Plan:   Jennifer Hogan is a 52 y.o. female with a hx of CAD status post CABG (LIMA to LAD/RI and RIMA to OM) '20, diabetes, hyperlipidemia who is being seen 05/22/2022 for the evaluation of elevated troponin at the request of Dr.   Clark.  NSTEMI CAD s/p CABG -- presented with elevated CBGs and found to have DKA. hsTn trend of 15>>300>>1792>>2087>>5048. EKG showed ST depression in inferolateral leads, improved with repeat tracings. Denies any over anginal symptoms, but does complain of a GERD like sensation in her chest. Cath 04/2021 with severe native  vessel CAD, but patent LIMA/RIMA with rec's for medical therapy -- as noted below, EF now declined to 20-25% with severely reduce RV with anteroseptal/apex akinesis. 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 Given this finding will need ischemic evaluation. Will plan for potential R/LHC tomorrow pending renal function. MD to see -- continue ASA, plavix, Toprol XL  BiV HF -- Echo today showed decline in LVEF of 20-25% with severely reduced RV function. Does not appear volume overloaded on exam, resting comfortably. -- GDMT has been limited in the past in the setting of hyperkalemia with spiro, and DMI. Has been on Toprol XL 12.5mg daily, appears warm and dry presently.   DKA DMI -- treated with IV insulin, IVFs. Now back on insulin pump -- per primary  AKI -- Cr 1.06>>0.97>>1.67 -- follow up BMET in the morning  For questions or updates, please contact [facility] Please consult [email or web address] for contact info under    Signed, Arcadio Cope, NP  05/22/2022 11:00 AM  

## 2022-05-22 NOTE — Progress Notes (Signed)
Pt turned off insulin pump at 1600 after receiving long acting insulin (SEMGLEE)

## 2022-05-22 NOTE — Progress Notes (Signed)
Patients' BP low SBP 60's-70's. Mentation intact. Manual BP checked and is 68/52. Elink notified. Gave 25g Albumin and 5 mg of midodrine that was ordered earlier. Patient states that "this is why I take my meds at night, so I can sleep through my pressure dropping." Patient not surprised by her BP. Mentation still intact. Dr. Vladimir Faster ordered another 25g of Albumin and 5 mg of Midodrine. Patient is resting in bed comfortably with no complaints. Placed IV consult order for a USG PIV in case future pressors are needed. Continuing to monitor patient closely.

## 2022-05-22 NOTE — Consult Note (Addendum)
Cardiology Consultation   [name] MRN: [id number]; DOB: 01/07/1971  Admit date: 05/20/2022 Date of Consult: 05/22/2022  PCP:  [name], NP   [facility] Providers Cardiologist:  Armanda Magic, MD     Patient Profile:   Jennifer Hogan is a 52 y.o. female with a hx of CAD status post CABG (LIMA to LAD/RI and RIMA to OM) '20, diabetes, hyperlipidemia who is being seen 05/22/2022 for the evaluation of elevated troponin at the request of Dr. Chestine Spore.  History of Present Illness:   Ms. Sturgeon is a 52 year old female with a past medical history noted above.  She has had multiple PCI's in the past.  Underwent CABG 10/2018 with LIMA to LAD/ramus intermedius and RIMA to OM.  Echocardiogram at that time showed normal LV function with trace mitral/tricuspid regurgitation.  She was admitted 09/2020 with DKA and a non-STEMI.  Hospital course was complicated by acute respiratory failure and pulmonary edema.  High-sensitivity troponin peaked at greater than 24,000. Echo during that admission showed a reduced EF of 20 to 25%.  Cardiac catheterization showed severe CAD with total occlusion of proximal LAD, moderate stenosis of intermediate branch and severe ostial circumflex in-stent stenosis, patent LIMA and RIMA.  She was diuresed with IV Lasix and discharged on metoprolol XL as well as spironolactone.  Admitted again 04/2021 with a non-STEMI and lactic acidosis.  Echo showed LVEF of 30 to 35%.  She underwent cardiac catheterization with no culprit lesion, severe two-vessel obstructive CAD but patent grafts, moderately elevated LVEDP.  He was seen in the advanced heart failure clinic on 10/2021.  She was continued on Toprol-XL 12.5 mg daily, off spironolactone secondary to hyperkalemia, no ARB started secondary to hyperkalemia and not a candidate for an SGLT2 given her DMI.  It was recommended that ICD be deferred until it was determined if her EF recovered with  GDMT.  In regards to her coronary disease she was continued on aspirin and Plavix, not on a statin secondary to myalgias but on PCSK9.  She was again admitted 12/2021 with DKA and elevated troponin.  No angina or acute EKG changes.  It was recommended she be treated medically.  Echocardiogram showed LVEF of 45 to 50%, no regional wall motion abnormality, normal RV size and function, trivial MR.  She [facility] ED on 4/27 with complaints of nausea vomiting and severe hyperglycemia.  Reported she had switched to a new insulin pump.  She was admitted for DKA.   Admission labs showed sodium 136, potassium 4.7, creatinine 1.02, WBC 13.6, hemoglobin 12.  High-sensitivity troponin cycled 15>>300>>1792>>2087>>5048, lactic acid 3.4>>2.4>>2.4. EKG on admission showed sinus tachycardia with ST depression in inferior lateral leads, LAFB.  Subsequent EKGs with improvement in ST depression.  Echo 4/29 with LVEF of 20 to 25%, kinesis of the anterior septal wall and apex with overall severe LV dysfunction, question stress-induced cardiomyopathy, severely reduced RV with normal size, mildly dilated left atrium. Cardiology now asked to evaluate.   In talking with patient, she is somewhat lethargic but does awaken to answer questions mostly. Received ativan and phenergan earlier in the shift. She reports a GERD like sensation in her chest that started since she has been in the hospital. No shortness of breath, edema prior to admission. Reports she has been taking her other home medications.   Past Medical History:  Diagnosis Date   Allergy    Anemia    Anxiety    Asthma  CAD S/P percutaneous coronary angioplasty 03/28/2017   Cataract    CHF (congestive heart failure) (HCC)    Coronary artery disease    a. s/p PTCA DES x3 in the LAD (ostial DES placed, mid DES placed, distal DES placed) and PTCA/DES x1 to intermediate branch 02/2017. b. ACS 02/08/18 with LCx stent placement.   Depression    Diabetes  mellitus    type 1   DKA (diabetic ketoacidoses) 12/31/2017   DKA (diabetic ketoacidosis) (HCC) 01/13/2022   Dyspnea    with exertion and dust, no oxygen   Elevated platelet count    GERD (gastroesophageal reflux disease)    Heart murmur    never caused any problems   High cholesterol    Hypothyroidism    Ischemic cardiomyopathy    a. EF low-normal with basal inferior akinesis, basal septal hypokinesis by echo 01/2018.   Myocardial infarction (HCC) 2019   Nausea and vomiting 12/04/2010   Non-ST elevation (NSTEMI) myocardial infarction [facility]) 02/08/2018   NSTEMI (non-ST elevated myocardial infarction) (HCC)    Substance abuse (HCC)    marijuana    Past Surgical History:  Procedure Laterality Date   25 GAUGE PARS PLANA VITRECTOMY WITH 20 GAUGE MVR PORT Right 11/24/2021   Procedure: 25 GAUGE PARS PLANA VITRECTOMY APPROACH RIGHT EYE, FLUID FLUID EXCHANGE;  Surgeon: Carmela Rima, MD;  Location: [facility] OR;  Service: Ophthalmology;  Laterality: Right;   AIR/FLUID EXCHANGE Right 12/27/2021   Procedure: AIR/FLUID EXCHANGE;  Surgeon: Carmela Rima, MD;  Location: [facility] OR;  Service: Ophthalmology;  Laterality: Right;   APPENDECTOMY     BIOPSY  09/29/2021   Procedure: BIOPSY;  Surgeon: Benancio Deeds, MD;  Location: [facility];  Service: Gastroenterology;;   CATARACT EXTRACTION W/PHACO Right 11/24/2021   Procedure: CATARACT EXTRACTION AND INTRAOCULAR LENS PLACEMENT (IOC) RIGHT EYE;  Surgeon: Carmela Rima, MD;  Location: [facility] OR;  Service: Ophthalmology;  Laterality: Right;   COLONOSCOPY     COLONOSCOPY WITH PROPOFOL N/A 09/29/2021   Procedure: COLONOSCOPY WITH PROPOFOL;  Surgeon: Benancio Deeds, MD;  Location: [facility] ENDOSCOPY;  Service: Gastroenterology;  Laterality: N/A;   CORONARY ARTERY BYPASS GRAFT N/A 11/04/2018   Procedure: CORONARY ARTERY BYPASS GRAFTING (CABG), ON PUMP, TIMES THREE, USING LEFT AND RIGHT INTERNAL MAMMARY ARTERIES;  Surgeon: Linden Dolin, MD;  Location: [facility] OR;   Service: Open Heart Surgery;  Laterality: N/A;   CORONARY STENT INTERVENTION N/A 03/20/2017   Procedure: DES x 3 LAD, DES OM; Surgeon: Kathleene Hazel, MD;  Location: [facility] INVASIVE CV LAB;  Service: Cardiovascular;  Laterality: N/A;   CORONARY/GRAFT ACUTE MI REVASCULARIZATION N/A 02/08/2018   Procedure: Coronary/Graft Acute MI Revascularization;  Surgeon: Lyn Records, MD;  Location: [facility] INVASIVE CV LAB;  Service: Cardiovascular;  Laterality: N/A;   ESOPHAGOGASTRODUODENOSCOPY (EGD) WITH PROPOFOL N/A 09/29/2021   Procedure: ESOPHAGOGASTRODUODENOSCOPY (EGD) WITH PROPOFOL;  Surgeon: Benancio Deeds, MD;  Location: [facility];  Service: Gastroenterology;  Laterality: N/A;   GAS INSERTION Right 04/28/2021   Procedure: INSERTION OF GAS ( C3F8);  Surgeon: Carmela Rima, MD;  Location: [facility] OR;  Service: Ophthalmology;  Laterality: Right;  Right eye   GAS/FLUID EXCHANGE Right 04/28/2021   Procedure: GAS/FLUID EXCHANGE;  Surgeon: Carmela Rima, MD;  Location: [facility] OR;  Service: Ophthalmology;  Laterality: Right;  Right eye   LASER PHOTO ABLATION Right 11/24/2021   Procedure: ENDO LASER PAN PHOTOCOAGULATION;  Surgeon: Carmela Rima, MD;  Location: [facility] OR;  Service: Ophthalmology;  Laterality: Right;   LASER PHOTO ABLATION  Right 12/27/2021   Procedure: ENDO LASER;  Surgeon: Carmela Rima, MD;  Location: [facility] OR;  Service: Ophthalmology;  Laterality: Right;   LEFT HEART CATH AND CORONARY ANGIOGRAPHY N/A 03/20/2017   Procedure: LEFT HEART CATH AND CORONARY ANGIOGRAPHY;  Surgeon: Kathleene Hazel, MD;  Location: [facility];  Service: Cardiovascular;  Laterality: N/A;   LEFT HEART CATH AND CORONARY ANGIOGRAPHY N/A 02/08/2018   Procedure: LEFT HEART CATH AND CORONARY ANGIOGRAPHY;  Surgeon: Lyn Records, MD;  Location: [facility] CV LAB;  Service: Cardiovascular;  Laterality: N/A;   LEFT HEART CATH AND CORONARY ANGIOGRAPHY N/A 10/29/2018   Procedure: LEFT HEART CATH AND CORONARY  ANGIOGRAPHY;  Surgeon: Kathleene Hazel, MD;  Location: [facility]VAS[facility] CV LAB;  Service: Cardiovascular;  Laterality: N/A;   LEFT HEART CATH AND CORS/GRAFTS ANGIOGRAPHY N/A 10/04/2020   Procedure: LEFT HEART CATH AND CORS/GRAFTS ANGIOGRAPHY;  Surgeon: Tonny Bollman, MD;  Location: [facility] INVASIVE CV LAB;  Service: Cardiovascular;  Laterality: N/A;   LEFT HEART CATH AND CORS/GRAFTS ANGIOGRAPHY N/A 05/11/2021   Procedure: LEFT HEART CATH AND CORS/GRAFTS ANGIOGRAPHY;  Surgeon: Swazil[place], Peter M, MD;  Location: [facility] INVASIVE CV LAB;  Service: Cardiovascular;  Laterality: N/A;   PARS PLANA VITRECTOMY Right 04/28/2021   Procedure: PARS PLANA VITRECTOMY WITH 25 GAUGE REMOVAL OF TRATIONAL MEMBRANE RIGHT EYE; DRAINAGE OF SUBRETINAL FLUID RIGHT EYE;  Surgeon: Carmela Rima, MD;  Location: [facility] OR;  Service: Ophthalmology;  Laterality: Right;  Right eye   PARS PLANA VITRECTOMY 27 GAUGE Right 12/27/2021   Procedure: PARS PLANA VITRECTOMY 27 GAUGE;  Surgeon: Carmela Rima, MD;  Location: [facility] OR;  Service: Ophthalmology;  Laterality: Right;   PHOTOCOAGULATION WITH LASER Right 04/28/2021   Procedure: PHOTOCOAGULATION WITH LASER;  Surgeon: Carmela Rima, MD;  Location: [facility] OR;  Service: Ophthalmology;  Laterality: Right;  Right eye   POLYPECTOMY  09/29/2021   Procedure: POLYPECTOMY;  Surgeon: Benancio Deeds, MD;  Location: [facility];  Service: Gastroenterology;;   TEE WITHOUT CARDIOVERSION N/A 11/04/2018   Procedure: TRANSESOPHAGEAL ECHOCARDIOGRAM (TEE);  Surgeon: Linden Dolin, MD;  Location: [facility] OR;  Service: Open Heart Surgery;  Laterality: N/A;     Home Medications:  Prior to Admission medications   Medication Sig Start Date End Date Taking? Authorizing Provider  acetaminophen (TYLENOL) 325 MG tablet Take 650 mg by mouth daily as needed for mild pain, fever or headache.    [provider]  albuterol (VENTOLIN HFA) 108 (90 Base) MCG/ACT inhaler INHALE 2 PUFFS INTO THE LUNGS EVERY 6 (SIX)  HOURS AS NEEDED FOR WHEEZING OR SHORTNESS OF BREATH. 10/25/21   Newlin, Odette Horns, MD  Alirocumab (PRALUENT) 150 MG/ML SOAJ Inject 1 pen  into the skin every 14 (fourteen) days. 09/23/21   Quintella Reichert, MD  aspirin 81 MG EC tablet Take 1 tablet (81 mg total) by mouth daily. Patient taking differently: Take 81 mg by mouth at bedtime. 12/07/17   Amin, Loura Halt, MD  b complex vitamins tablet Take 1 tablet by mouth 3 (three) times a week.    [provider]  budesonide (PULMICORT) 0.25 MG/2ML nebulizer solution Take 0.25 mg by nebulization daily as needed (shortness of breath/wheezing).    [provider]  buPROPion (WELLBUTRIN XL) 150 MG 24 hr tablet Take 1 tablet (150 mg total) by mouth every morning. 03/06/22 06/17/22  Karsten Ro, MD  clopidogrel (PLAVIX) 75 MG tablet Take 1 tablet (75 mg total) by mouth daily with breakfast. Patient taking differently: Take 75 mg  by mouth at bedtime. 12/05/21   Quintella Reichert, MD  Continuous Blood Gluc Sensor (DEXCOM G7 SENSOR) MISC Apply 1 sensor every 10 days 01/04/22   Carlus Pavlov, MD  diclofenac Sodium (VOLTAREN) 1 % GEL Apply 2-4 g topically 3 (three) times daily as needed (knee pain).    [provider]  diphenhydrAMINE (BENADRYL) 25 MG tablet Take 37.5 mg by mouth at bedtime.    [provider]  DULoxetine (CYMBALTA) 30 MG capsule Take 1 capsule (30 mg total) by mouth at bedtime. 03/06/22   Karsten Ro, MD  fluticasone (FLONASE) 50 MCG/ACT nasal spray Insert 1 spray each nostril daily for 3 days then use 2-3 times a week as needed 05/16/22   [name], NP  furosemide (LASIX) 20 MG tablet Take 1 tablet (20 mg total) by mouth daily as needed (for weight gain). 10/14/20   Clegg, Amy D, NP  Glucagon 3 MG/DOSE POWD Place 3 mg into the nose once as needed for up to 1 dose. 07/01/21   Carlus Pavlov, MD  Insulin Disposable Pump (OMNIPOD 5 G6 INTRO, GEN 5,) KIT Use as directed. 05/03/22   Carlus Pavlov, MD   Insulin Disposable Pump (OMNIPOD 5 G6 PODS, GEN 5,) MISC Use every 3 (three) days. 05/03/22   Carlus Pavlov, MD  insulin lispro (HUMALOG) 100 UNIT/ML injection Inject 0.35 mLs (35 Units total) into the skin daily via pump. 03/08/22   Carlus Pavlov, MD  levothyroxine (SYNTHROID) 100 MCG tablet Take 1 tablet (100 mcg total) by mouth daily before breakfast. 04/10/22   Hoy Register, MD  losartan (COZAAR) 25 MG tablet Take 1 tablet (25 mg total) by mouth daily. 01/16/22   Pennie Banter, DO  meloxicam (MOBIC) 15 MG tablet Take 1 tablet (15 mg total) by mouth daily. 05/17/22   [name], NP  methylcellulose (CITRUCEL) oral powder Take 1 packet by mouth daily. 12/23/21   Armbruster, Willaim Rayas, MD  metoCLOPramide (REGLAN) 5 MG tablet Take 1 tablet (5 mg total) by mouth every 8 (eight) hours as needed for nausea or vomiting. 12/23/21 12/23/22  Benancio Deeds, MD  metoprolol succinate (TOPROL-XL) 25 MG 24 hr tablet Take 1 tablet (25 mg total) by mouth daily. 01/16/22   Pennie Banter, DO  mometasone-formoterol (DULERA) 100-5 MCG/ACT AERO Inhale 2 puffs into the lungs 2 (two) times daily. 02/15/22   [name], NP  montelukast (SINGULAIR) 10 MG tablet Take 1 tablet (10 mg total) by mouth at bedtime. 02/15/22   [name], NP  Multiple Vitamin (MULTIVITAMIN WITH MINERALS) TABS tablet Take 1 tablet by mouth daily.    [provider]  neomycin-polymyxin b-dexamethasone (MAXITROL) 3.5-10000-0.1 OINT Place 1 Application into both eyes 2 (two) times daily. 03/18/22   [provider]  nitroGLYCERIN (NITROSTAT) 0.4 MG SL tablet Place 1 tablet (0.4 mg total) under the tongue every 5 (five) minutes as needed for chest pain. 10/14/20   Clegg, Amy D, NP  omeprazole (PRILOSEC OTC) 20 MG tablet Take 20 mg by mouth daily as needed (acid reflux).    [provider]  ondansetron (ZOFRAN) 4 MG tablet Take 1 tablet (4 mg total) by mouth daily as needed for nausea or  vomiting. 03/24/22 03/24/23  Hollice Espy, MD  OVER THE COUNTER MEDICATION Take 3 tablets by mouth See admin instructions. [employer] Super Lysine Immune Support- Take 3 tablets by mouth once a day on an empty stomach    [provider]  pantoprazole (PROTONIX) 40 MG tablet Take 1 tablet (40 mg total) by mouth 2 (two) times daily. 03/24/22   Hollice Espy, MD    Inpatient Medications: Scheduled Meds:  aspirin EC  81 mg Oral QHS   buPROPion  150 mg Oral BH-q7a   Chlorhexidine Gluconate Cloth  6 each Topical Daily   clopidogrel  75 mg Oral QHS   DULoxetine  30 mg Oral QHS   insulin pump   Subcutaneous Q4H   levothyroxine  100 mcg Oral Q0600   metoprolol succinate  12.5 mg Oral Daily   midodrine  5 mg Oral TID WC   mometasone-formoterol  2 puff Inhalation BID   montelukast  10 mg Oral QHS   pantoprazole  40 mg Oral BID   Continuous Infusions:  sodium chloride     cefTRIAXone (ROCEPHIN)  IV Stopped (05/22/22 0922)   heparin 900 Units/hr (05/22/22 1000)   promethazine (PHENERGAN) injection (IM or IVPB) Stopped (05/22/22 0946)   PRN Meds: sodium chloride, acetaminophen, dextrose, diclofenac Sodium, diphenhydrAMINE, fluticasone, levalbuterol, LORazepam, mouth rinse, polyethylene glycol, promethazine (PHENERGAN) injection (IM or IVPB)  Allergies:    Allergies  Allergen Reactions   Atorvastatin Other (See Comments)    Myalgias    Crestor [Rosuvastatin Calcium] Other (See Comments)    Severe myalgias and joint pain   Monosodium Glutamate Nausea And Vomiting and Other (See Comments)    MSG - migraine   Zetia [Ezetimibe] Other (See Comments)    myalgias   Ciprofloxin Hcl [Ciprofloxacin] Nausea And Vomiting and Other (See Comments)    Severe migraine   Food Color Red [Red Dye] Diarrhea    Social History:   Social History   Socioeconomic History   Marital status: Divorced    Spouse name: Not on file   Number of children: 0   Years of education: Not on file    Highest education level: Doctorate  Occupational History   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 3 times weekly; reports decreasing use   Sexual activity: Not Currently    Birth control/protection: None  Other Topics Concern   Not on file  Social History Narrative   Not on file   Social Determinants of Health   Financial Resource Strain: High Risk (05/15/2022)   Overall Financial Resource Strain (CARDIA)    Difficulty of Paying Living Expenses: Very hard  Food Insecurity: Food Insecurity Present (05/15/2022)   Hunger Vital Sign    Worried About Running Out of Food in the Last Year: Often true    Ran Out of Food in the Last Year: Often true  Transportation Needs: Unmet Transportation Needs (05/15/2022)   PRAPARE - Transportation    Lack of Transportation (Medical): Yes    Lack of Transportation (Non-Medical): Yes  Physical Activity: Unknown (05/15/2022)   Exercise Vital Sign    Days of Exercise per Week: 0 days    Minutes of Exercise per Session: Not on file  Stress: Stress Concern Present (05/15/2022)   [employer] of Occupational Health - Occupational Stress Questionnaire    Feeling of Stress : Very much  Social Connections: Socially Isolated (05/15/2022)   Social Connection and Isolation Panel [NHANES]    Frequency of Communication with Friends and Family: More than three times a week    Frequency of Social Gatherings with Friends and Family: Once a week    Attends Religious Services: Never    Database administrator or Organizations: No    Attends  Engineer, structural: Not on file    Marital Status: Divorced  Intimate Partner Violence: Not At Risk (01/14/2022)   Humiliation, Afraid, Rape, and Kick questionnaire    Fear of Current or Ex-Partner: No    Emotionally Abused: No    Physically Abused: No    Sexually Abused: No    Family History:    Family History  Problem Relation Age of Onset   Cancer Mother        T cell lymphoma   Hypertension Mother    Hypercalcemia Mother    OCD Father    Anxiety disorder Father    Coronary artery disease Father    Congestive Heart Failure Father    Asthma Father    Hypercalcemia Father    Hypertension Father    Colon polyps Father    Depression Maternal Grandmother    Diabetes Paternal Grandmother    Stomach cancer Neg Hx    Esophageal cancer Neg Hx    Colon cancer Neg Hx    Rectal cancer Neg Hx      ROS:  Please see the history of present illness.   All other ROS reviewed and negative.     Physical Exam/Data:   Vitals:   05/22/22 0930 05/22/22 0945 05/22/22 0955 05/22/22 1000  BP: 126/88 (!) 133/90 (!) 133/90 130/87  Pulse: (!) 123 (!) 125 (!) 127 (!) 123  Resp: (!) 23 (!) 33  19  Temp:      TempSrc:      SpO2: 99% 99%  98%  Weight:      Height:        Intake/Output Summary (Last 24 hours) at 05/22/2022 1100 Last data filed at 05/22/2022 1000 Gross per 24 hour  Intake 1027.32 ml  Output 2550 ml  Net -1522.68 ml      05/22/2022    3:08 AM 05/21/2022    4:07 AM 05/17/2022   10:02 AM  Last 3 Weights  Weight (lbs) 141 lb 12.1 oz 141 lb 12.1 oz 141 lb 12.8 oz  Weight (kg) 64.3 kg 64.3 kg 64.32 kg     Body mass index is 25.93 kg/m.  General:  Well nourished, well developed, in no acute distress, lethargic but answers questions with stimulated  HEENT: normal Neck: no JVD Vascular: No carotid bruits; Distal pulses 2+ bilaterally Cardiac:  normal S1, S2; tachy; no murmur  Lungs:  poor respiratory effort Abd: soft, nontender, no hepatomegaly  Ext: no  edema Musculoskeletal:  No deformities, BUE and BLE strength normal and equal Skin: warm and dry  Neuro:  CNs 2-12 intact, no focal abnormalities noted Psych:  Normal affect   EKG:  The EKG was personally reviewed and demonstrates:  sinus tachycardia, initially with inferolateral ST depressions, but improved on follow up EKGs Telemetry:  Telemetry was personally reviewed and demonstrates:  Sinus Tachycardia   Relevant CV Studies:  Cath: 05/11/2021    Prox LAD to Mid LAD lesion is  100% stenosed.   Dist LAD-1 lesion is 70% stenosed.   Dist LAD-2 lesion is 70% stenosed.   Mid LAD lesion is 60% stenosed.   Ost Cx to Prox Cx lesion is 99% stenosed.   Prox Cx to Mid Cx lesion is 70% stenosed.   Ramus-1 lesion is 60% stenosed.   Ramus-2 lesion is 20% stenosed.   Ramus-3 lesion is 70% stenosed.   RPAV lesion is 60% stenosed.   Non-stenotic Mid LAD to Dist LAD lesion was previously treated.   LIMA graft was visualized by angiography and is normal in caliber.   RIMA graft was visualized by non-selective angiography and is normal in caliber.   The graft exhibits no disease.   LV end diastolic pressure is moderately elevated.   Severe 2 vessel obstructive CAD Patent sequential LIMA graft  to the ramus intermediate and distal LAD Patent RIMA to the distal LCx Moderately elevated LVEDP   Compared to September 2022 there is no significant angiographic change. Recommend continued medical therapy.    Diagnostic Dominance: Right   Echo: 05/22/2022  IMPRESSIONS     1. Only limited images obtained due to patient not cooperating; akinesis  of the anteroseptal wall and apex with overall severe LV dysfunction;  consider stress induced cardiomyopathy.   2. Left ventricular ejection fraction, by estimation, is 20 to 25%. The  left ventricle has severely decreased function. The left ventricle  demonstrates regional wall motion abnormalities (see scoring  diagram/findings for description).    3. Right ventricular systolic function is severely reduced. The right  ventricular size is normal.   4. Left atrial size was mildly dilated.   5. The mitral valve is normal in structure. Mild mitral valve  regurgitation.   6. The aortic valve is tricuspid. Aortic valve regurgitation is not  visualized. No aortic stenosis is present.   FINDINGS   Left Ventricle: Left ventricular ejection fraction, by estimation, is 20  to 25%. The left ventricle has severely decreased function. The left  ventricle demonstrates regional wall motion abnormalities. The left  ventricular internal cavity size was normal   in size. There is no left ventricular hypertrophy.   Right Ventricle: The right ventricular size is normal. Right ventricular  systolic function is severely reduced.   Left Atrium: Left atrial size was mildly dilated.   Right Atrium: Right atrial size was not well visualized.   Pericardium: There is no evidence of pericardial effusion.   Mitral Valve: The mitral valve is normal in structure. Mild mitral valve  regurgitation.   Tricuspid Valve: The tricuspid valve is normal in structure.   Aortic Valve: The aortic valve is tricuspid. Aortic valve regurgitation is  not visualized. No aortic stenosis is present.   Pulmonic Valve: The pulmonic valve was normal in structure.   Aorta: The aortic root is normal in size and structure.   Additional Comments: Only limited images obtained due to patient not  cooperating; akinesis of the anteroseptal wall and apex with overall  severe LV dysfunction; consider stress induced cardiomyopathy.   Laboratory Data:  High Sensitivity Troponin:   Recent Labs  Lab 05/20/22 2337 05/21/22 0248 05/21/22 0938 05/21/22 1134 05/22/22 0530  TROPONINIHS 15 300* 1,792* 2,087* 5,048*     Chemistry Recent Labs  Lab 05/21/22 0248 05/21/22 0736 05/22/22 0530  NA 135 136 138  K 3.9 4.0 3.6  CL 105 106 98  CO2 19* 19* 22  GLUCOSE 178* 156* 292*   BUN 17 15 22*  CREATININE  1.06* 0.97 1.67*  CALCIUM 8.8* 9.3 9.0  MG  --   --  1.8  GFRNONAA >60 >60 36*  ANIONGAP 11 11 18*    Recent Labs  Lab 05/20/22 1923  PROT 8.0  ALBUMIN 4.5  AST 28  ALT 19  ALKPHOS 84  BILITOT 0.7   Lipids  Recent Labs  Lab 05/21/22 0938  TRIG 92    Hematology Recent Labs  Lab 05/20/22 1923 05/20/22 1929 05/21/22 0056 05/22/22 0530  WBC 13.6*  --   --  13.9*  RBC 4.03  --   --  3.64*  HGB 12.0 12.9 11.9* 10.8*  HCT 35.8* 38.0 35.0* 32.7*  MCV 88.8  --   --  89.8  MCH 29.8  --   --  29.7  MCHC 33.5  --   --  33.0  RDW 13.7  --   --  14.3  PLT 477*  --   --  422*   Thyroid No results for input(s): "TSH", "FREET4" in the last 168 hours.  BNPNo results for input(s): "BNP", "PROBNP" in the last 168 hours.  DDimer No results for input(s): "DDIMER" in the last 168 hours.   Radiology/Studies:  [facility] Port 1 View  Result Date: 05/21/2022 CLINICAL DATA:  Abdominal pain. EXAM: PORTABLE CHEST 1 VIEW COMPARISON:  03/23/2022. FINDINGS: The heart size and mediastinal contours are within normal limits. There is hyperinflation of the lungs with interstitial prominence bilaterally. No consolidation, effusion, or pneumothorax. Sternotomy wires are present over the midline. IMPRESSION: Hyperinflation of the lungs with interstitial prominence bilaterally slightly increased from the prior exam, may be infectious or inflammatory. Electronically Signed   By: Thornell Sartorius M.D.   On: 05/21/2022 00:34   CT ABDOMEN PELVIS W CONTRAST  Result Date: 05/20/2022 CLINICAL DATA:  Acute abdominal pain with vomiting, initial encounter EXAM: CT ABDOMEN AND PELVIS WITH CONTRAST TECHNIQUE: Multidetector CT imaging of the abdomen and pelvis was performed using the standard protocol following bolus administration of intravenous contrast. RADIATION DOSE REDUCTION: This exam was performed according to the departmental dose-optimization program which includes automated exposure  control, adjustment of the mA and/or kV according to patient size and/or use of iterative reconstruction technique. CONTRAST:  OMNIPAQUE IOHEXOL 300 MG/ML  SOLN COMPARISON:  03/23/2022 FINDINGS: Lower chest: No acute abnormality. Hepatobiliary: No focal liver abnormality is seen. No gallstones, gallbladder wall thickening, or biliary dilatation. Pancreas: Unremarkable. No pancreatic ductal dilatation or surrounding inflammatory changes. Spleen: Normal in size without focal abnormality. Adrenals/Urinary Tract: Adrenal glands are within normal limits. Kidneys show normal enhancement pattern. No renal calculi or obstructive changes are noted. Normal excretion is noted bilaterally. The bladder is well distended. Stomach/Bowel: Appendix has been surgically removed. No obstructive or inflammatory changes of the colon are noted. Small bowel and stomach are within normal limits. Vascular/Lymphatic: Atherosclerotic calcifications of the abdominal aorta are noted. No lymphadenopathy is seen. Reproductive: Uterus and bilateral adnexa are unremarkable. Other: No abdominal wall hernia or abnormality. No abdominopelvic ascites. Musculoskeletal: Degenerative changes of lumbar spine are noted. IMPRESSION: No acute abnormality noted to correspond with the given clinical history. Electronically Signed   By: Alcide Clever M.D.   On: 05/20/2022 22:38     Assessment and Plan:   TAMITHA NORELL is a 52 y.o. female with a hx of CAD status post CABG (LIMA to LAD/RI and RIMA to OM) '20, diabetes, hyperlipidemia who is being seen 05/22/2022 for the evaluation of elevated troponin at the request of Dr.  Clark.  NSTEMI CAD s/p CABG -- presented with elevated CBGs and found to have DKA. hsTn trend of 15>>300>>1792>>2087>>5048. EKG showed ST depression in inferolateral leads, improved with repeat tracings. Denies any over anginal symptoms, but does complain of a GERD like sensation in her chest. Cath 04/2021 with severe native  vessel CAD, but patent LIMA/RIMA with rec's for medical therapy -- as noted below, EF now declined to 20-25% with severely reduce RV with anteroseptal/apex akinesis. 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 Given this finding will need ischemic evaluation. Will plan for potential [facility] tomorrow pending renal function. MD to see -- continue ASA, plavix, Toprol XL  BiV HF -- Echo today showed decline in LVEF of 20-25% with severely reduced RV function. Does not appear volume overloaded on exam, resting comfortably. -- GDMT has been limited in the past in the setting of hyperkalemia with spiro, and DMI. Has been on Toprol XL 12.5mg  daily, appears warm and dry presently.   DKA DMI -- treated with IV insulin, IVFs. Now back on insulin pump -- per primary  AKI -- Cr 1.06>>0.97>>1.67 -- follow up BMET in the morning  For questions or updates, please contact [facility] Please consult [email or web address] for contact info under    Signed, Laverda Page, NP  05/22/2022 11:00 AM

## 2022-05-22 NOTE — Progress Notes (Signed)
An USGPIV (ultrasound guided PIV) has been placed for short-term vasopressor infusion. A correctly placed ivWatch must be used when administering Vasopressors. Should this treatment be needed beyond 72 hours, central line access should be obtained.  It will be the responsibility of the bedside nurse to follow best practice to prevent extravasations.   

## 2022-05-22 NOTE — Progress Notes (Addendum)
Patients blood sugar is 205. Patient has her insulin pump on but it needs to be set up to transmit and she hasn't done that as of now. 2 units given to cover her blood sugar. Patient informed me that her basal rate is .55 and bolus is .65. Patient is checking her own sugars sometimes and we get them when she lets [name]. Patient is to start covering her own sugars once she re-enters the information into her app.

## 2022-05-22 NOTE — Progress Notes (Signed)
Echocardiogram 2D Echocardiogram has been performed.  Jennifer Hogan 05/22/2022, 8:34 AM

## 2022-05-22 NOTE — Procedures (Signed)
Central Venous Catheter Insertion Procedure Note  DENISSA COZART  [id number]  05-07-1970  Date:05/22/22  Time:1:03 PM   Provider Performing:Shauntia Levengood R Jory Welke   Procedure: Insertion of Non-tunneled Central Venous [phone]) with US guidance ([id number])   Indication(s) Medication administration  Consent Risks of the procedure as well as the alternatives and risks of each were explained to the patient and/or caregiver.  Consent for the procedure was obtained and is signed in the bedside chart  Anesthesia Topical only with 1% lidocaine   Timeout Verified patient identification, verified procedure, site/side was marked, verified correct patient position, special equipment/implants available, medications/allergies/relevant history reviewed, required imaging and test results available.  Sterile Technique Maximal sterile technique including full sterile barrier drape, hand hygiene, sterile gown, sterile gloves, mask, hair covering, sterile ultrasound probe cover (if used).  Procedure Description Area of catheter insertion was cleaned with chlorhexidine and draped in sterile fashion.  With real-time ultrasound guidance a central venous catheter was placed into the left internal jugular vein. Nonpulsatile blood flow and easy flushing noted in all ports.  The catheter was sutured in place and sterile dressing applied.  Complications/Tolerance None; patient tolerated the procedure well. Chest X-ray is ordered to verify placement for internal jugular or subclavian cannulation.   Chest x-ray is not ordered for femoral cannulation.  EBL Minimal  Specimen(s) None  Darcella Gasman Dottie Vaquerano, PA-C

## 2022-05-22 NOTE — Progress Notes (Signed)
ANTICOAGULATION CONSULT NOTE - Follow Up Consult  Pharmacy Consult for Heparin Indication: elevated troponin  Allergies  Allergen Reactions   Atorvastatin Other (See Comments)    Myalgias    Crestor [Rosuvastatin Calcium] Other (See Comments)    Severe myalgias and joint pain   Monosodium Glutamate Nausea And Vomiting and Other (See Comments)    MSG - migraine   Zetia [Ezetimibe] Other (See Comments)    myalgias   Ciprofloxin Hcl [Ciprofloxacin] Nausea And Vomiting and Other (See Comments)    Severe migraine   Food Color Red [Red Dye] Diarrhea    Patient Measurements: Height: 5\' 2"  (157.5 cm) Weight: 64.3 kg (141 lb 12.1 oz) IBW/kg (Calculated) : 50.1 Heparin Dosing Weight: 64.3 kg  Vital Signs: Temp: 98.5 F (36.9 C) (04/29 1112) Temp Source: Oral (04/29 1112) BP: 113/81 (04/29 1600) Pulse Rate: 118 (04/29 1600)  Labs: Recent Labs    05/20/22 1923 05/20/22 1929 05/20/22 2324 05/21/22 0056 05/21/22 0248 05/21/22 0736 05/21/22 0938 05/21/22 1134 05/21/22 1630 05/21/22 2249 05/22/22 0530 05/22/22 1500  HGB 12.0 12.9  --  11.9*  --   --   --   --   --   --  10.8*  --   HCT 35.8* 38.0  --  35.0*  --   --   --   --   --   --  32.7*  --   PLT 477*  --   --   --   --   --   --   --   --   --  422*  --   HEPARINUNFRC  --   --   --   --   --   --   --   --    < > 0.53 0.23* 0.24*  CREATININE 1.02*  --    < >  --  1.06* 0.97  --   --   --   --  1.67*  --   TROPONINIHS  --   --    < >  --  300*  --  1,792* 2,087*  --   --  5,048*  --    < > = values in this interval not displayed.     Estimated Creatinine Clearance: 34.3 mL/min (A) (by C-G formula based on SCr of 1.67 mg/dL (H)).   Assessment: 52 yo female who presented on 4/27 with epigastric pain and vomiting. Patient have  a history of HFrEF 45%, type 1 diabetes, CAD, and HTN. Patient was tachycardic and hyperglycemic on admission. Concerns for DKA. Taking DAPT with plavix and aspirin PTA.   Pharmacy consulted  to dose IV heparin, plan x 48hr.  Heparin level remains subtherapeutic (0.24) on heparin 900 units/hr. No issues with line or bleeding reported per RN.  Goal of Therapy:  Heparin level 0.3-0.7 units/ml Monitor platelets by anticoagulation protocol: Yes   Plan:  Increase heparin to 1050 units/hr Check heparin level in 8 hours Will f/u with CCM on end of therapy. 48hr will be complete 4/30 @0600   Christoper Fabian, PharmD, BCPS Please see amion for complete clinical pharmacist phone list 05/22/2022,5:20 PM

## 2022-05-22 NOTE — Progress Notes (Signed)
Pt confirmed personal insulin pump is turned off.

## 2022-05-22 NOTE — Progress Notes (Signed)
NAME:  Jennifer Hogan, MRN:  [id number], DOB:  [birth year], LOS: 1 ADMISSION DATE:  05/20/2022 REFERRING MD:  EDP, CHIEF COMPLAINT:  vomiting   History of Present Illness:  52 year old woman w/ hx of DM1 on insulin pump, CAD post CABG presenting with N/V and severe hyperglycemia.   Improved with aggressive hydration at [facility] then became more SOB and tachycardic so PCCM consulted.  VBG prior to transfer 7.22.  She appears well. Only new things are she switched insulin pump brands last week and did get a TDAP shot this week.  Otherwise pan ROS neg.  Pertinent  Medical History  DM1 CAD post CABG GERD Allergy Asthma Depression HLD  Significant Hospital Events: Including procedures, antibiotic start and stop dates in addition to other pertinent events   4/28 admitted with nausea/vomiting and severe hyperglycemia in the setting of DKA due to question insulin pump failure.  Early morning patient seen with new significant oxygen requirement from room air to 10L with associated tachycardia and posterior crackles concerning for volume overload/pulmonary edema 4/29 Remains on 10L, trop to 5k  Interim History / Subjective:  Hypotensive overnight, this has improved this morning but remains on 10L HFNC despite diuresing 3.5L yesterday,  troponin continues to climb from 300 on admission to over 5k today.  She denies CP other than when coughing, repeat EKG without acute changes  Echo with reduced EF 20-25%  Objective   Blood pressure 134/88, pulse (!) 120, temperature 99.2 F (37.3 C), temperature source Oral, resp. rate (!) 32, height 5\' 2"  (1.575 m), weight 64.3 kg, last menstrual period 04/21/2022, SpO2 96 %.        Intake/Output Summary (Last 24 hours) at 05/22/2022 0829 Last data filed at 05/22/2022 0800 Gross per 24 hour  Intake 1088.02 ml  Output 3500 ml  Net -2411.98 ml    Filed Weights   05/21/22 0407 05/22/22 0308  Weight: 64.3 kg 64.3 kg     General:  well nourished F,  fatigued appearing in no acute distress HEENT: MM pink/moist, sclera anicteric Neuro: sleeping but arousable and answering questions appropriately  CV: s1s2 rrr, no m/r/g PULM:  scattered crackles, no wheezing or distress on 10L  GI: soft, non-tender Extremities: warm/dry, no edema  Skin: no rashes or lesions  Mag 1.8 Trop 300>1792>2087>5048 K 3.6  Resolved Hospital Problem list   N/A  Assessment & Plan:  52 y/o woman with a history of three vessel CAD, HFrEF due to ICM, IDDM who presented with DKA after her insulin pump malfunctioned  now with acute hypoxic respiratory failure and NSTEMI with acutely worsened EF    Acute Hypoxic Respiratory Failure NSTEMI superimposed on baseline HFrEF , ischemic cardiomyopathy and three vessel CAD with prior CABG 10/2018 Per chart review patient had several admission and similar to current with DKA and NSTEMI on admission complicated by acute respiratory failure and pulmonary edema.   Echocardiogram December 2023 EF 45 to 50% with mildly decreased LV function, normal RV function -troponin continues to climb, denies CP or pressure except when coughing/vomiting, repeat EKG without acute ST depression or elevation -continue Asa, plavix and heparin and consult cardiology -Echo with EF reduced from 45-50% 4 months ago to 20-25% today and severely reduced RV systolic function -diuresed 3L yesterday, does not appear volume overloaded today, repeat CXR and defer further diuresis to cardiology -continue home bb -cards planning for R and L heart cath 4/30 -replete Mag and K prn -continuous tele -continue bronchodilators  Mild diabetic ketoacidosis  History of type 1 diabetes  Likely secondary to pump failure  -now off insulin gtt and back on home insulin pump and  -Accu-Checks, heart healthy carb mod diet    Nausea/vomiting with longstanding diabetic gastroparesis GERD, hiatal hernia CT ABD negative on admit, lipase negative  -PRN antiemetics   -Continue Reglan   AKI Baseline ~1.0, 1.06 on admission -up to 1.67 today, likely secondary to diuresis after fluids for DKA -appears fairly euvolemic today, hold both fluids and lasix and follow UOP and BMP  Anxiety -Continue home Welbutrin   Hypothyroidism  -Continue home synthroid      Best Practice (right click and "Reselect all SmartList Selections" daily)   Diet/type: Regular consistency (see orders) DVT prophylaxis: systemic dose LMWH GI prophylaxis: PPI Lines: N/A Foley:  N/A Code Status:  full code Last date of multidisciplinary goals of care discussion: Continue to update patient daily   Critical care time:   CRITICAL CARE Performed by: Darcella Gasman Caitrin Pendergraph  Total critical care time:  40 minutes  Critical care time was exclusive of separately billable procedures and treating other patients.  Critical care was necessary to treat or prevent imminent or life-threatening deterioration.  Critical care was time spent personally by me on the following activities: development of treatment plan with patient and/or surrogate as well as nursing, discussions with consultants, evaluation of patient's response to treatment, examination of patient, obtaining history from patient or surrogate, ordering and performing treatments and interventions, ordering and review of laboratory studies, ordering and review of radiographic studies, pulse oximetry and re-evaluation of patient's condition.  Darcella Gasman Tailynn Armetta, PA-C  Pulmonary & Critical care See Amion for pager If no response to pager , please call [phone] until 7pm After 7:00 pm call Elink  [phone]

## 2022-05-22 NOTE — Progress Notes (Signed)
Patient is very anxious, grinding her teeth. Patient complaining of 7/10 epigastric pain and N/V. She states that "the only thing that works for her is ativan." Dr. Vladimir Faster notified of this and placed ativan order. Will give phenergan once it comes up from pharmacy.

## 2022-05-22 NOTE — Progress Notes (Signed)
  Transition of Care (TOC) Screening Note   Patient Details  Name: Jennifer Hogan Date of Birth: [birth year]   Transition of Care [facility]) CM/SW Contact:    Tom-Johnson, Hershal Coria, RN Phone Number: 05/22/2022, 2:38 PM  Patient is admitted for DKA, off insulin drip today. On 10L HFNC. On IV abx, Heparin gtt and Levo. CM spoke with patient at bedside, patient is easily arousable, response to voice. States she is from home alone, does not have children and family lives out of state. Mother lives in [place].  No Employed. Just granted disability. Independent with care and drive self prior to admission.  Transition of Care Department [facility]) has reviewed patient and no TOC needs or recommendations have been identified at this time. TOC will continue to monitor patient advancement through interdisciplinary progression rounds. If new patient transition needs arise, please place a TOC consult.

## 2022-05-22 NOTE — Progress Notes (Signed)
Inpatient Diabetes Program Recommendations  AACE/ADA: New Consensus Statement on Inpatient Glycemic Control (2015)  Target Ranges:  Prepandial:   less than 140 mg/dL      Peak postprandial:   less than 180 mg/dL (1-2 hours)      Critically ill patients:  140 - 180 mg/dL   Lab Results  Component Value Date   GLUCAP 200 (H) 05/22/2022   HGBA1C 8.0 (H) 03/24/2022    Call received from pharmacy.  She states that MD would like insulin pump removed due to need for NPO tomorrow for cardiac cath.  Recommend Semglee 12 units (prior to removal of insulin pump), Novolog 0-6 units tid with meals and HS, and Novolog 3 units tid with meals (hold if patient eats less than 50%).  Will follow.   Thanks,  Beryl Meager, RN, BC-ADM Inpatient Diabetes Coordinator Pager [phone]  (8a-5p)

## 2022-05-23 ENCOUNTER — Telehealth: Payer: Self-pay | Admitting: Internal Medicine

## 2022-05-23 ENCOUNTER — Encounter (HOSPITAL_COMMUNITY)
Admission: EM | Disposition: A | Discharge status: Home / Self Care | Payer: Self-pay | Source: Home / Self Care | Attending: Internal Medicine

## 2022-05-23 ENCOUNTER — Other Ambulatory Visit: Payer: Self-pay

## 2022-05-23 DIAGNOSIS — I5023 Acute on chronic systolic (congestive) heart failure: Secondary | ICD-10-CM

## 2022-05-23 DIAGNOSIS — E7800 Pure hypercholesterolemia, unspecified: Secondary | ICD-10-CM | POA: Diagnosis not present

## 2022-05-23 DIAGNOSIS — I420 Dilated cardiomyopathy: Secondary | ICD-10-CM

## 2022-05-23 DIAGNOSIS — I25118 Atherosclerotic heart disease of native coronary artery with other forms of angina pectoris: Secondary | ICD-10-CM

## 2022-05-23 DIAGNOSIS — I2510 Atherosclerotic heart disease of native coronary artery without angina pectoris: Secondary | ICD-10-CM | POA: Diagnosis not present

## 2022-05-23 DIAGNOSIS — E1010 Type 1 diabetes mellitus with ketoacidosis without coma: Secondary | ICD-10-CM | POA: Diagnosis not present

## 2022-05-23 DIAGNOSIS — I2583 Coronary atherosclerosis due to lipid rich plaque: Secondary | ICD-10-CM

## 2022-05-23 HISTORY — PX: RIGHT/LEFT HEART CATH AND CORONARY/GRAFT ANGIOGRAPHY: CATH118267

## 2022-05-23 LAB — POCT I-STAT EG7
Acid-Base Excess: 0 mmol/L (ref 0.0–2.0)
Acid-base deficit: 1 mmol/L (ref 0.0–2.0)
Bicarbonate: 23.1 mmol/L (ref 20.0–28.0)
Bicarbonate: 23.8 mmol/L (ref 20.0–28.0)
Calcium, Ion: 1.1 mmol/L — ABNORMAL LOW (ref 1.15–1.40)
Calcium, Ion: 1.13 mmol/L — ABNORMAL LOW (ref 1.15–1.40)
HCT: 31 % — ABNORMAL LOW (ref 36.0–46.0)
HCT: 32 % — ABNORMAL LOW (ref 36.0–46.0)
Hemoglobin: 10.5 g/dL — ABNORMAL LOW (ref 12.0–15.0)
Hemoglobin: 10.9 g/dL — ABNORMAL LOW (ref 12.0–15.0)
O2 Saturation: 47 %
O2 Saturation: 46 %
Potassium: 3.7 mmol/L (ref 3.5–5.1)
Potassium: 3.7 mmol/L (ref 3.5–5.1)
Sodium: 137 mmol/L (ref 135–145)
Sodium: 137 mmol/L (ref 135–145)
TCO2: 24 mmol/L (ref 22–32)
TCO2: 25 mmol/L (ref 22–32)
pCO2, Ven: 35.2 mmHg — ABNORMAL LOW (ref 44–60)
pCO2, Ven: 36.1 mmHg — ABNORMAL LOW (ref 44–60)
pH, Ven: 7.425 (ref 7.25–7.43)
pH, Ven: 7.426 (ref 7.25–7.43)
pO2, Ven: 25 mmHg — CL (ref 32–45)
pO2, Ven: 24 mmHg — CL (ref 32–45)

## 2022-05-23 LAB — CBC
HCT: 31.1 % — ABNORMAL LOW (ref 36.0–46.0)
Hemoglobin: 10.6 g/dL — ABNORMAL LOW (ref 12.0–15.0)
MCH: 30.1 pg (ref 26.0–34.0)
MCHC: 34.1 g/dL (ref 30.0–36.0)
MCV: 88.4 fL (ref 80.0–100.0)
Platelets: 373 10*3/uL (ref 150–400)
RBC: 3.52 MIL/uL — ABNORMAL LOW (ref 3.87–5.11)
RDW: 14.3 % (ref 11.5–15.5)
WBC: 13.7 10*3/uL — ABNORMAL HIGH (ref 4.0–10.5)
nRBC: 0 % (ref 0.0–0.2)

## 2022-05-23 LAB — COOXEMETRY PANEL
Carboxyhemoglobin: 1.1 % (ref 0.5–1.5)
Methemoglobin: <0.7 % (ref 0.0–1.5)
O2 Saturation: 53.8 %
Total hemoglobin: 11.1 g/dL — ABNORMAL LOW (ref 12.0–16.0)

## 2022-05-23 LAB — POCT I-STAT 7, (LYTES, BLD GAS, ICA,H+H)
Acid-base deficit: 2 mmol/L (ref 0.0–2.0)
Bicarbonate: 20.6 mmol/L (ref 20.0–28.0)
Calcium, Ion: 1.05 mmol/L — ABNORMAL LOW (ref 1.15–1.40)
HCT: 31 % — ABNORMAL LOW (ref 36.0–46.0)
Hemoglobin: 10.5 g/dL — ABNORMAL LOW (ref 12.0–15.0)
O2 Saturation: 97 %
Potassium: 3.5 mmol/L (ref 3.5–5.1)
Sodium: 137 mmol/L (ref 135–145)
TCO2: 22 mmol/L (ref 22–32)
pCO2 arterial: 29.4 mmHg — ABNORMAL LOW (ref 32–48)
pH, Arterial: 7.455 — ABNORMAL HIGH (ref 7.35–7.45)
pO2, Arterial: 87 mmHg (ref 83–108)

## 2022-05-23 LAB — GLUCOSE, CAPILLARY
Glucose-Capillary: 133 mg/dL — ABNORMAL HIGH (ref 70–99)
Glucose-Capillary: 149 mg/dL — ABNORMAL HIGH (ref 70–99)
Glucose-Capillary: 194 mg/dL — ABNORMAL HIGH (ref 70–99)
Glucose-Capillary: 228 mg/dL — ABNORMAL HIGH (ref 70–99)
Glucose-Capillary: 230 mg/dL — ABNORMAL HIGH (ref 70–99)
Glucose-Capillary: 287 mg/dL — ABNORMAL HIGH (ref 70–99)
Glucose-Capillary: 293 mg/dL — ABNORMAL HIGH (ref 70–99)
Glucose-Capillary: 56 mg/dL — ABNORMAL LOW (ref 70–99)
Glucose-Capillary: 64 mg/dL — ABNORMAL LOW (ref 70–99)
Glucose-Capillary: 97 mg/dL (ref 70–99)

## 2022-05-23 LAB — BASIC METABOLIC PANEL
Anion gap: 11 (ref 5–15)
BUN: 21 mg/dL — ABNORMAL HIGH (ref 6–20)
CO2: 22 mmol/L (ref 22–32)
Calcium: 8.5 mg/dL — ABNORMAL LOW (ref 8.9–10.3)
Chloride: 101 mmol/L (ref 98–111)
Creatinine, Ser: 1.11 mg/dL — ABNORMAL HIGH (ref 0.44–1.00)
GFR, Estimated: 59 mL/min — ABNORMAL LOW (ref >60)
Glucose, Bld: 245 mg/dL — ABNORMAL HIGH (ref 70–99)
Potassium: 3.3 mmol/L — ABNORMAL LOW (ref 3.5–5.1)
Sodium: 134 mmol/L — ABNORMAL LOW (ref 135–145)

## 2022-05-23 LAB — HEPARIN LEVEL (UNFRACTIONATED): Heparin Unfractionated: 0.36 IU/mL (ref 0.30–0.70)

## 2022-05-23 LAB — CULTURE, BLOOD (ROUTINE X 2)

## 2022-05-23 LAB — MAGNESIUM: Magnesium: 2.3 mg/dL (ref 1.7–2.4)

## 2022-05-23 SURGERY — RIGHT/LEFT HEART CATH AND CORONARY/GRAFT ANGIOGRAPHY
Anesthesia: LOCAL

## 2022-05-23 MED ORDER — SODIUM CHLORIDE 0.9 % IV SOLN: 250.0000 mL | INTRAVENOUS | Status: DC | PRN

## 2022-05-23 MED ORDER — SODIUM CHLORIDE 0.9 % IV SOLN: INTRAVENOUS | Status: AC

## 2022-05-23 MED ORDER — FUROSEMIDE 10 MG/ML IJ SOLN
40.0000 mg | Freq: Once | INTRAMUSCULAR | Status: DC
  Filled 2022-05-23: qty 4

## 2022-05-23 MED ORDER — SODIUM CHLORIDE 0.9% FLUSH: 3.0000 mL | Freq: Two times a day (BID) | INTRAVENOUS | Status: DC

## 2022-05-23 MED ORDER — SODIUM CHLORIDE 0.9 % IV SOLN: INTRAVENOUS | Status: DC

## 2022-05-23 MED ORDER — HEPARIN SODIUM (PORCINE) 5000 UNIT/ML IJ SOLN
5000.0000 [IU] | Freq: Three times a day (TID) | INTRAMUSCULAR | Status: DC
  Administered 2022-05-23 – 2022-05-30 (×22): 5000 [IU] via SUBCUTANEOUS
  Filled 2022-05-23 (×21): qty 1

## 2022-05-23 MED ORDER — INSULIN ASPART 100 UNIT/ML IJ SOLN: 3.0000 [IU] | Freq: Once | INTRAMUSCULAR | Status: DC

## 2022-05-23 MED ORDER — SENNOSIDES-DOCUSATE SODIUM 8.6-50 MG PO TABS
1.0000 | ORAL_TABLET | Freq: Two times a day (BID) | ORAL | Status: DC
  Administered 2022-05-23 – 2022-06-01 (×17): 1 via ORAL
  Filled 2022-05-23 (×17): qty 1

## 2022-05-23 MED ORDER — IOHEXOL 350 MG/ML SOLN
INTRAVENOUS | Status: DC | PRN
  Administered 2022-05-23: 75 mL

## 2022-05-23 MED ORDER — HEPARIN (PORCINE) IN NACL 1000-0.9 UT/500ML-% IV SOLN
INTRAVENOUS | Status: DC | PRN
  Administered 2022-05-23 (×2): 500 mL

## 2022-05-23 MED ORDER — LABETALOL HCL 5 MG/ML IV SOLN: 10.0000 mg | INTRAVENOUS | Status: AC | PRN

## 2022-05-23 MED ORDER — LIDOCAINE HCL (PF) 1 % IJ SOLN
INTRAMUSCULAR | Status: DC | PRN
  Administered 2022-05-23: 2 mL
  Administered 2022-05-23: 15 mL

## 2022-05-23 MED ORDER — SODIUM CHLORIDE 0.9% FLUSH: 3.0000 mL | INTRAVENOUS | Status: DC | PRN

## 2022-05-23 MED ORDER — INSULIN ASPART 100 UNIT/ML IJ SOLN: 0.0000 [IU] | INTRAMUSCULAR | Status: DC

## 2022-05-23 MED ORDER — POTASSIUM CHLORIDE 10 MEQ/100ML IV SOLN
10.0000 meq | INTRAVENOUS | Status: AC
  Administered 2022-05-23 (×4): 10 meq via INTRAVENOUS
  Filled 2022-05-23 (×5): qty 100

## 2022-05-23 MED ORDER — LIDOCAINE HCL (PF) 1 % IJ SOLN
INTRAMUSCULAR | Status: AC
  Filled 2022-05-23: qty 30

## 2022-05-23 MED ORDER — POLYETHYLENE GLYCOL 3350 17 G PO PACK
17.0000 g | PACK | Freq: Every day | ORAL | Status: DC
  Administered 2022-05-24 – 2022-06-01 (×7): 17 g via ORAL
  Filled 2022-05-23 (×9): qty 1

## 2022-05-23 MED ORDER — SODIUM CHLORIDE 0.9% FLUSH
3.0000 mL | Freq: Two times a day (BID) | INTRAVENOUS | Status: DC
  Administered 2022-05-23 – 2022-06-01 (×16): 3 mL via INTRAVENOUS

## 2022-05-23 MED ORDER — SODIUM CHLORIDE 0.9 % IV SOLN
2.0000 g | INTRAVENOUS | Status: AC
  Administered 2022-05-24 – 2022-05-27 (×4): 2 g via INTRAVENOUS
  Filled 2022-05-23 (×4): qty 20

## 2022-05-23 MED ORDER — HYDRALAZINE HCL 20 MG/ML IJ SOLN: 10.0000 mg | INTRAMUSCULAR | Status: AC | PRN

## 2022-05-23 MED ORDER — HYDROMORPHONE HCL 1 MG/ML IJ SOLN
INTRAMUSCULAR | Status: AC
  Filled 2022-05-23: qty 0.5

## 2022-05-23 MED ORDER — DOBUTAMINE-DEXTROSE 4-5 MG/ML-% IV SOLN
2.5000 ug/kg/min | INTRAVENOUS | Status: DC
  Administered 2022-05-23: 2.5 ug/kg/min via INTRAVENOUS
  Filled 2022-05-23: qty 250

## 2022-05-23 SURGICAL SUPPLY — 13 items
CATH INFINITI 5FR MULTPACK ANG (CATHETERS) IMPLANT
CATH SWAN GANZ 7F STRAIGHT (CATHETERS) IMPLANT
CLOSURE MYNX CONTROL 5F (Vascular Products) IMPLANT
GLIDESHEATH SLENDER 7FR .021G (SHEATH) IMPLANT
KIT HEART LEFT (KITS) ×1 IMPLANT
KIT MICROPUNCTURE NIT STIFF (SHEATH) IMPLANT
PACK CARDIAC CATHETERIZATION (CUSTOM PROCEDURE TRAY) ×1 IMPLANT
SHEATH PINNACLE 5F 10CM (SHEATH) IMPLANT
SHEATH PINNACLE 7F 10CM (SHEATH) IMPLANT
SHEATH PROBE COVER 6X72 (BAG) IMPLANT
TRANSDUCER W/STOPCOCK (MISCELLANEOUS) ×1 IMPLANT
TUBING CIL FLEX 10 FLL-RA (TUBING) ×1 IMPLANT
WIRE EMERALD 3MM-J .035X150CM (WIRE) IMPLANT

## 2022-05-23 NOTE — Progress Notes (Signed)
   R/LHC showed patent grafts and unchanged CAD compared to prior cath as well as low cardiac output of 3.04 L/min, LVEDP of 24 mmHg, and PCWP of 24 mmHg. CO-OX was 53 this morning. BP has been soft this afternoon with systolic BP in the 80s. Discussed with Dr. Gala Romney and CHF team will see tomorrow. He recommended starting Dobutamine 2.5 mcg/kg/min tonight and transferring to 2c if there is a bed available. Orders placed. Went and updated patient and RN before leaving. Patient is resting comfortably in no acute distress.  Corrin Parker, PA-C 05/23/2022 6:03 PM

## 2022-05-23 NOTE — Progress Notes (Signed)
NAME:  Jennifer Hogan, MRN:  [id number], DOB:  1970/03/27, LOS: 2 ADMISSION DATE:  05/20/2022 REFERRING MD:  EDP, CHIEF COMPLAINT:  vomiting   History of Present Illness:  52 year old woman w/ hx of DM1 on insulin pump, CAD post CABG presenting with N/V and severe hyperglycemia.   Improved with aggressive hydration at [facility] then became more SOB and tachycardic so PCCM consulted.  VBG prior to transfer 7.22.  She appears well. Only new things are she switched insulin pump brands last week and did get a TDAP shot this week.  Otherwise pan ROS neg.  Pertinent  Medical History  DM1 CAD post CABG GERD Allergy Asthma Depression HLD  Significant Hospital Events: Including procedures, antibiotic start and stop dates in addition to other pertinent events   4/28 admitted with nausea/vomiting and severe hyperglycemia in the setting of DKA due to question insulin pump failure.  Early morning patient seen with new significant oxygen requirement from room air to 10L with associated tachycardia and posterior crackles concerning for volume overload/pulmonary edema 4/29 Remains on 10L, trop to [facility] 4/30 O2 requirement down-trending, plan to go to cath lab today  Interim History / Subjective:  Feels fairly well and continues to deny chest pain or shortness of breath O2 requirement down to 4L   Objective   Blood pressure 121/78, pulse (!) 116, temperature 97.7 F (36.5 C), temperature source Oral, resp. rate (!) 23, height 5\' 2"  (1.575 m), weight 64.3 kg, last menstrual period 04/21/2022, SpO2 99 %. CVP:  [3 mmHg-7 mmHg] 3 mmHg      Intake/Output Summary (Last 24 hours) at 05/23/2022 0845 Last data filed at 05/23/2022 0500 Gross per 24 hour  Intake 643.14 ml  Output --  Net 643.14 ml    Filed Weights   05/21/22 0407 05/22/22 0308  Weight: 64.3 kg 64.3 kg     General:  well nourished F, fatigued appearing in no acute distress HEENT: MM pink/moist, sclera anicteric Neuro: awake,  alert and oriented, following commands CV: s1s2 rrr, no m/r/g PULM:  clear on 4L without rhonchi, wheezing or accessory muscle use  GI: soft, non-tender Extremities: warm/dry, no edema  Skin: no rashes or lesions  WBC 13.7 Creatinine 1.1 Trop 300>1792>2087>5048 K 3.3  Resolved Hospital Problem list   N/A  Assessment & Plan:  52 y/o woman with a history of three vessel CAD, HFrEF due to ICM, IDDM who presented with DKA after her insulin pump malfunctioned  now with acute hypoxic respiratory failure and NSTEMI with acutely worsened EF    Acute Hypoxic Respiratory Failure NSTEMI, acute HFrEF and R heart failure ischemic cardiomyopathy and three vessel CAD with prior CABG 10/2018 EF decreased to 20-25% with decreased R heart systolic function on echo yesterday O2 requirement improving  -seen by cardiology and plan for L and R heart cath today -continue Asa, plavix and heparin  -coox 53 % and CVP 3 -replete electrolytes as needed -continuous tele -continue bronchodilators and Ceftriaxone     Mild diabetic ketoacidosis  History of type 1 diabetes  Likely secondary to pump failure  -Off insulin gtt, was back on pump-then transitioned back to SSI and long acting in the setting of NPO status  -Accu-Checks, back on insulin pump likely tomorrow    Nausea/vomiting with longstanding diabetic gastroparesis GERD, hiatal hernia CT ABD negative on admit, lipase negative  -PRN antiemetics  -Continue Reglan   AKI Baseline ~1.0, 1.06 on admission -improved to 1.1 today and making urine  -  continue to follow BMP and avoid nephrotoxins    Anxiety -Continue home Welbutrin   Hypothyroidism  -Continue home synthroid      Best Practice (right click and "Reselect all SmartList Selections" daily)   Diet/type: Regular consistency (see orders), NPO for procedure  DVT prophylaxis: systemic dose LMWH GI prophylaxis: PPI Lines: N/A Foley:  N/A Code Status:  full code Last date of  multidisciplinary goals of care discussion: Continue to update patient daily   Critical care time:     Darcella Gasman Jericho Cieslik, PA-C [place] [facility] See Amion for pager If no response to pager , please call [phone] until 7pm After 7:00 pm call Elink  [phone]

## 2022-05-23 NOTE — Telephone Encounter (Signed)
Patient's friend came in to office saying that the hospital misplaced patient's Dexcom G6 transmitter and is requesting a sample.

## 2022-05-23 NOTE — Progress Notes (Signed)
ANTICOAGULATION CONSULT NOTE - Follow Up Consult  Pharmacy Consult for Heparin Indication: elevated troponin  Allergies  Allergen Reactions   Atorvastatin Other (See Comments)    Myalgias    Crestor [Rosuvastatin Calcium] Other (See Comments)    Severe myalgias and joint pain   Monosodium Glutamate Nausea And Vomiting and Other (See Comments)    MSG - migraine   Zetia [Ezetimibe] Other (See Comments)    myalgias   Ciprofloxin Hcl [Ciprofloxacin] Nausea And Vomiting and Other (See Comments)    Severe migraine   Food Color Red [Red Dye] Diarrhea    Patient Measurements: Height: 5\' 2"  (157.5 cm) Weight: 64.3 kg (141 lb 12.1 oz) IBW/kg (Calculated) : 50.1 Heparin Dosing Weight: 64.3 kg  Vital Signs: Temp: 97.7 F (36.5 C) (04/30 0737) Temp Source: Oral (04/30 0737) BP: 132/97 (04/30 0905) Pulse Rate: 119 (04/30 0905)  Labs: Recent Labs    05/20/22 1923 05/20/22 1929 05/21/22 0056 05/21/22 0248 05/21/22 0736 05/21/22 0938 05/21/22 1134 05/21/22 1630 05/22/22 0530 05/22/22 1500 05/23/22 0121  HGB 12.0   < > 11.9*  --   --   --   --   --  10.8*  --  10.6*  HCT 35.8*   < > 35.0*  --   --   --   --   --  32.7*  --  31.1*  PLT 477*  --   --   --   --   --   --   --  422*  --  373  HEPARINUNFRC  --   --   --   --   --   --   --    < > 0.23* 0.24* 0.36  CREATININE 1.02*   < >  --    < > 0.97  --   --   --  1.67*  --  1.11*  TROPONINIHS  --    < >  --    < >  --  1,792* 2,087*  --  5,048*  --   --    < > = values in this interval not displayed.     Estimated Creatinine Clearance: 51.6 mL/min (A) (by C-G formula based on SCr of 1.11 mg/dL (H)).   Assessment: 52 yo female who presented on 4/27 with epigastric pain and vomiting. Patient have  a history of HFrEF 45%, type 1 diabetes, CAD, and HTN. Patient was tachycardic and hyperglycemic on admission. Concerns for DKA. Taking DAPT with plavix and aspirin PTA.    Heparin level therapeutic (0.36) on heparin 1050  units/hr. No issues with line or bleeding reported per RN. No overt s/sx of bleeding per RN. Patient scheduled for LHC today.   Goal of Therapy:  Heparin level 0.3-0.7 units/ml Monitor platelets by anticoagulation protocol: Yes   Plan:  Continue heparin at 1050 units/hr Daily heparin level and CBC F/u post-cath for heparin plans  Eldridge Scot, PharmD Clinical Pharmacist 05/23/2022 10:12 AM Please check AMION for all [facility] Pharmacy numbers

## 2022-05-23 NOTE — Progress Notes (Signed)
[facility] ADULT ICU REPLACEMENT PROTOCOL   The patient does apply for the [facility] Adult ICU Electrolyte Replacment Protocol based on the criteria listed below:   1.Exclusion criteria: TCTS, ECMO, Dialysis, and Myasthenia Gravis patients 2. Is GFR >/= 30 ml/min? Yes.    Patient's GFR today is 59 3. Is SCr </= 2? Yes.   Patient's SCr is 1.11 mg/dL 4. Did SCr increase >/= 0.5 in 24 hours? No. 5.Pt's weight >40kg  Yes.   6. Abnormal electrolyte(s): k 3.3  7. Electrolytes replaced per protocol 8.  Call MD STAT for K+ </= 2.5, Phos </= 1, or Mag </= 1 Physician:    Markus Daft A 05/23/2022 4:47 AM

## 2022-05-23 NOTE — Interval H&P Note (Signed)
History and Physical Interval Note:  05/23/2022 10:54 AM  Corrie Dandy A Janak  has presented today for surgery, with the diagnosis of Heart Failure.  The various methods of treatment have been discussed with the patient and family. After consideration of risks, benefits and other options for treatment, the patient has consented to  Procedure(s): RIGHT/LEFT HEART CATH AND CORONARY/GRAFT ANGIOGRAPHY (N/A) as a surgical intervention.  The patient's history has been reviewed, patient examined, no change in status, stable for surgery.  I have reviewed the patient's chart and labs.  Questions were answered to the patient's satisfaction.    Cath Lab Visit (complete for each Cath Lab visit)  Clinical Evaluation Leading to the Procedure:   ACS: Yes  Non-ACS:    Anginal Classification: CCS III  Anti-ischemic medical therapy: Minimal Therapy (1 class of medications)  Non-Invasive Test Results: No non-invasive testing performed  Prior CABG: Previous CABG        Verne Carrow

## 2022-05-23 NOTE — Inpatient Diabetes Management (Signed)
Inpatient Diabetes Program Recommendations  AACE/ADA: New Consensus Statement on Inpatient Glycemic Control (2015)  Target Ranges:  Prepandial:   less than 140 mg/dL      Peak postprandial:   less than 180 mg/dL (1-2 hours)      Critically ill patients:  140 - 180 mg/dL   Lab Results  Component Value Date   GLUCAP 97 05/23/2022   HGBA1C 8.0 (H) 03/24/2022    Review of Glycemic Control  Latest Reference Range & Units 05/23/22 02:47 05/23/22 07:30 05/23/22 10:10 05/23/22 11:31 05/23/22 13:11 05/23/22 14:35  Glucose-Capillary 70 - 99 mg/dL 664 (H) 403 (H) 474 (H) 194 (H) 133 (H) 97   Diabetes history: DM 1 Outpatient Diabetes medications:  Omnipod insulin pump Omnipod- basal rate total is 12.4 units/24 hours Unsure of CHO coverage/correction bolus Current orders for Inpatient glycemic control:  Novolog 0-9 units q 4 hours Semglee 12 units daily  Inpatient Diabetes Program Recommendations:   Patient now transferred to 6E30 after cardiac cath.   Called and spoke with patient by phone. She states that insulin pump has been restarted by her in the last 20 minutes.  She also has restarted her CGM.  Reminded patient that she was given 12 units of basal insulin/Semglee this morning and therefore may need to reduce basal rates until 5/1.  Patient states that she will monitor glucose levels closely.  Notified provider that patient has restarted insulin pump.  Orders to be placed.  Encouraged close monitoring by RN as well.    Thanks,  Beryl Meager, RN, BC-ADM Inpatient Diabetes Coordinator Pager [phone]  (8a-5p)

## 2022-05-23 NOTE — Telephone Encounter (Signed)
Patient's friend, Angela Nevin picked up sample of Dexcom G6 Transmitter and sensor in office today.  Sample okayed by Moody Bruins

## 2022-05-23 NOTE — Progress Notes (Signed)
Patient in no respiratory distress at this time. Sp02=98% n 2lpm with clear breath sounds. Bipap on standby

## 2022-05-23 NOTE — Progress Notes (Signed)
ANTICOAGULATION CONSULT NOTE - Follow Up Consult  Pharmacy Consult for Heparin Indication: elevated troponin  Allergies  Allergen Reactions   Atorvastatin Other (See Comments)    Myalgias    Crestor [Rosuvastatin Calcium] Other (See Comments)    Severe myalgias and joint pain   Monosodium Glutamate Nausea And Vomiting and Other (See Comments)    MSG - migraine   Zetia [Ezetimibe] Other (See Comments)    myalgias   Ciprofloxin Hcl [Ciprofloxacin] Nausea And Vomiting and Other (See Comments)    Severe migraine   Food Color Red [Red Dye] Diarrhea    Patient Measurements: Height: 5\' 2"  (157.5 cm) Weight: 64.3 kg (141 lb 12.1 oz) IBW/kg (Calculated) : 50.1 Heparin Dosing Weight: 64.3 kg  Vital Signs: Temp: 98.8 F (37.1 C) (04/29 2349) Temp Source: Oral (04/29 2349) BP: 66/43 (04/29 2330) Pulse Rate: 94 (04/29 2330)  Labs: Recent Labs    05/20/22 1923 05/20/22 1929 05/21/22 0056 05/21/22 0248 05/21/22 0736 05/21/22 0938 05/21/22 1134 05/21/22 1630 05/22/22 0530 05/22/22 1500 05/23/22 0121  HGB 12.0   < > 11.9*  --   --   --   --   --  10.8*  --  10.6*  HCT 35.8*   < > 35.0*  --   --   --   --   --  32.7*  --  31.1*  PLT 477*  --   --   --   --   --   --   --  422*  --  373  HEPARINUNFRC  --   --   --   --   --   --   --    < > 0.23* 0.24* 0.36  CREATININE 1.02*   < >  --    < > 0.97  --   --   --  1.67*  --  1.11*  TROPONINIHS  --    < >  --    < >  --  1,792* 2,087*  --  5,048*  --   --    < > = values in this interval not displayed.     Estimated Creatinine Clearance: 51.6 mL/min (A) (by C-G formula based on SCr of 1.11 mg/dL (H)).   Assessment: 52 yo female who presented on 4/27 with epigastric pain and vomiting. Patient have  a history of HFrEF 45%, type 1 diabetes, CAD, and HTN. Patient was tachycardic and hyperglycemic on admission. Concerns for DKA. Taking DAPT with plavix and aspirin PTA.   Pharmacy consulted to dose IV heparin, plan x  48hr.  Heparin level therapeutic (0.36) on heparin 1050 units/hr. No issues with line or bleeding reported per RN. No overt s/sx of bleeding per RN. Patient scheduled for intervention tomorrow afternoon, will follow up with post procedure anticoagulation plan.   Goal of Therapy:  Heparin level 0.3-0.7 units/ml Monitor platelets by anticoagulation protocol: Yes   Plan:  Continue heparin at 1050 units/hr Check heparin level in 8 hours Will f/u with CCM on end of therapy. 48hr will be complete 4/30 @0600   Ruben Im, PharmD Clinical Pharmacist 05/23/2022 2:19 AM Please check AMION for all [facility] Pharmacy numbers

## 2022-05-24 ENCOUNTER — Encounter (HOSPITAL_COMMUNITY): Payer: Self-pay | Admitting: Cardiovascular Disease

## 2022-05-24 ENCOUNTER — Telehealth (HOSPITAL_COMMUNITY): Payer: Self-pay | Admitting: Pharmacy Technician

## 2022-05-24 ENCOUNTER — Other Ambulatory Visit (HOSPITAL_COMMUNITY): Payer: Self-pay

## 2022-05-24 ENCOUNTER — Other Ambulatory Visit: Payer: Self-pay

## 2022-05-24 DIAGNOSIS — E1010 Type 1 diabetes mellitus with ketoacidosis without coma: Secondary | ICD-10-CM | POA: Diagnosis not present

## 2022-05-24 DIAGNOSIS — I214 Non-ST elevation (NSTEMI) myocardial infarction: Secondary | ICD-10-CM | POA: Diagnosis not present

## 2022-05-24 DIAGNOSIS — I5021 Acute systolic (congestive) heart failure: Secondary | ICD-10-CM

## 2022-05-24 DIAGNOSIS — J9601 Acute respiratory failure with hypoxia: Secondary | ICD-10-CM | POA: Diagnosis not present

## 2022-05-24 DIAGNOSIS — I5023 Acute on chronic systolic (congestive) heart failure: Secondary | ICD-10-CM | POA: Diagnosis not present

## 2022-05-24 LAB — POTASSIUM: Potassium: 3.3 mmol/L — ABNORMAL LOW (ref 3.5–5.1)

## 2022-05-24 LAB — COOXEMETRY PANEL
Carboxyhemoglobin: 1.8 % — ABNORMAL HIGH (ref 0.5–1.5)
Methemoglobin: 1 % (ref 0.0–1.5)
O2 Saturation: 63.1 %
Total hemoglobin: 10 g/dL — ABNORMAL LOW (ref 12.0–16.0)

## 2022-05-24 LAB — GLUCOSE, CAPILLARY
Glucose-Capillary: 133 mg/dL — ABNORMAL HIGH (ref 70–99)
Glucose-Capillary: 162 mg/dL — ABNORMAL HIGH (ref 70–99)
Glucose-Capillary: 263 mg/dL — ABNORMAL HIGH (ref 70–99)
Glucose-Capillary: 213 mg/dL — ABNORMAL HIGH (ref 70–99)
Glucose-Capillary: 196 mg/dL — ABNORMAL HIGH (ref 70–99)
Glucose-Capillary: 170 mg/dL — ABNORMAL HIGH (ref 70–99)

## 2022-05-24 LAB — CBC
HCT: 28.6 % — ABNORMAL LOW (ref 36.0–46.0)
Hemoglobin: 9.7 g/dL — ABNORMAL LOW (ref 12.0–15.0)
MCH: 30.3 pg (ref 26.0–34.0)
MCHC: 33.9 g/dL (ref 30.0–36.0)
MCV: 89.4 fL (ref 80.0–100.0)
Platelets: 304 10*3/uL (ref 150–400)
RBC: 3.2 MIL/uL — ABNORMAL LOW (ref 3.87–5.11)
RDW: 14.3 % (ref 11.5–15.5)
WBC: 6 10*3/uL (ref 4.0–10.5)
nRBC: 0 % (ref 0.0–0.2)

## 2022-05-24 LAB — BASIC METABOLIC PANEL
Anion gap: 6 (ref 5–15)
BUN: 12 mg/dL (ref 6–20)
CO2: 25 mmol/L (ref 22–32)
Calcium: 8 mg/dL — ABNORMAL LOW (ref 8.9–10.3)
Chloride: 102 mmol/L (ref 98–111)
Creatinine, Ser: 0.91 mg/dL (ref 0.44–1.00)
GFR, Estimated: >60 mL/min (ref >60)
Glucose, Bld: 162 mg/dL — ABNORMAL HIGH (ref 70–99)
Potassium: 2.7 mmol/L — CL (ref 3.5–5.1)
Sodium: 133 mmol/L — ABNORMAL LOW (ref 135–145)

## 2022-05-24 LAB — CULTURE, BLOOD (ROUTINE X 2): Special Requests: ADEQUATE

## 2022-05-24 LAB — MAGNESIUM: Magnesium: 1.9 mg/dL (ref 1.7–2.4)

## 2022-05-24 LAB — HEMOGLOBIN A1C
Hgb A1c MFr Bld: 7.8 % — ABNORMAL HIGH (ref 4.8–5.6)
Mean Plasma Glucose: 177.16 mg/dL

## 2022-05-24 MED ORDER — POTASSIUM CHLORIDE 10 MEQ/100ML IV SOLN
10.0000 meq | INTRAVENOUS | Status: AC
  Administered 2022-05-24 (×2): 10 meq via INTRAVENOUS
  Filled 2022-05-24 (×2): qty 100

## 2022-05-24 MED ORDER — POTASSIUM CHLORIDE CRYS ER 20 MEQ PO TBCR
40.0000 meq | EXTENDED_RELEASE_TABLET | ORAL | Status: AC
  Administered 2022-05-24 (×2): 40 meq via ORAL
  Filled 2022-05-24 (×2): qty 2

## 2022-05-24 MED ORDER — POTASSIUM CHLORIDE CRYS ER 20 MEQ PO TBCR
60.0000 meq | EXTENDED_RELEASE_TABLET | Freq: Once | ORAL | Status: AC
  Administered 2022-05-24: 60 meq via ORAL
  Filled 2022-05-24 (×2): qty 3

## 2022-05-24 MED ORDER — INSULIN PUMP
Freq: Three times a day (TID) | SUBCUTANEOUS | Status: DC
  Administered 2022-05-24: 2.2 via SUBCUTANEOUS
  Administered 2022-05-24: 0.3 via SUBCUTANEOUS
  Administered 2022-05-31: 2.35 via SUBCUTANEOUS
  Administered 2022-06-01: 3.45 via SUBCUTANEOUS
  Administered 2022-06-01: 3.4 via SUBCUTANEOUS
  Administered 2022-06-01: 3.5 via SUBCUTANEOUS
  Filled 2022-05-24: qty 1

## 2022-05-24 MED ORDER — MAGNESIUM SULFATE 2 GM/50ML IV SOLN
2.0000 g | Freq: Once | INTRAVENOUS | Status: AC
  Administered 2022-05-24: 2 g via INTRAVENOUS
  Filled 2022-05-24: qty 50

## 2022-05-24 MED ORDER — FUROSEMIDE 10 MG/ML IJ SOLN
40.0000 mg | Freq: Once | INTRAMUSCULAR | Status: AC
  Administered 2022-05-24: 40 mg via INTRAVENOUS
  Filled 2022-05-24: qty 4

## 2022-05-24 NOTE — Consult Note (Addendum)
Advanced Heart Failure Team Consult Note   Primary Physician: [name], NP PCP-Cardiologist:  Armanda Magic, MD  Reason for Consultation: acute on chronic systolic heart failure with low output    HPI:    Jennifer Hogan is seen today for evaluation of acute on chronic systolic heart failure with low output at the request of Dr. Mayford Knife, Cardiology.   52 y.o.with HFrEF, CAD, s/p CABG in 10/2018 (LIMA to the LAD/ramus intermedius and RIMA to the obtuse marginal), DM Type I,  anemia, anxiety, hypothyroidism, and hyperlipidemia.    Admitted 09/2020 with DKA and NSTEMI.  Hospital course complicated by acute respiratory failure and pulmonary edema. Had ECHO with reduced EF down to 20-25%. LHC showed severe CAD with total occlusion of the proximal LAD, moderate stenosis of the intermediate branch, and severe ostial circumflex in-stent restenosis. Both bypass grafts, RIMA-LCx and LIMA-LAD both widely patent. Diuresed with IV lasix. Discharged on Toprol Xl and spironolactone. Discharged to home on 10/06/2020.    Admitted 4/23 with NSTEMI and lactic acidosis. Echo EF 30-35% Cath with stable anatomy.  No culprit.  Severe 2 vessel obstructive CAD (LAD CTO, Ost LCx 99%, RCA distal 60%) Patent sequential LIMA graft  to the ramus intermediate and distal LAD Patent RIMA to the distal LCx Moderately elevated LVEDP   Admitted 5/23 with DKA.   Readmitted December 23 with DKA and elevated troponin with medical management recommended. At that time EF improved to 45 to 50% with no wall motion abnormalities.   Readmitted 4/27 w/ DKA after her insulin pump malfunctioned. Admitted to ICU. Developed subsequent acute hypoxic respiratory failure/ acute pulmonary edema after aggressive IVF resuscitation. Also noted to have significant troponin elevation, peaking to 5048 with severe lactic acidosis at 3.4. EKG showed ST with inferior lateral ST depression. Echo repeated, EF down 20 to 25% with akinesis of  the anterior septal wall and apex felt to possibly represent a stress-induced cardiomyopathy. RV function also severely reduced. Cardiology was consulted and she underwent R/LHC yesterday which demonstrated severe double vessel native CAD w/ 2/2 patent grafts (RIMA-LCx and LIMA- LAD). Unchanged from prior study. RHC demonstrated low output HF, LV dominant (mRA 4, PAP 44/18, mPCWP 24, FICK CO 3.04, CI 1.85, Co-ox 46%). She was started on inotropic support w/ DBA. AHF team asked to further manage.   This morning, she remains on DBA 2.5.  Co-ox improved from 46%>>63% today. SCr much improved 1.67>>1.11>>0.91.   K low w/ diuresis, 2.7.    Echo 05/22/22 1. Only limited images obtained due to patient not cooperating; akinesis  of the anteroseptal wall and apex with overall severe LV dysfunction;  consider stress induced cardiomyopathy.   2. Left ventricular ejection fraction, by estimation, is 20 to 25%. The  left ventricle has severely decreased function. The left ventricle  demonstrates regional wall motion abnormalities (see scoring  diagram/findings for description).   3. Right ventricular systolic function is severely reduced. The right  ventricular size is normal.   4. Left atrial size was mildly dilated.   5. The mitral valve is normal in structure. Mild mitral valve  regurgitation.   6. The aortic valve is tricuspid. Aortic valve regurgitation is not  visualized. No aortic stenosis is present.    R/LHC 05/13/22 mRA 4 PAP 44/18 (29) mPCWP 24 FICK CO 3.04 FICK CI 1.85  PAPi 6.5      Prox LAD to Mid LAD lesion is 100% stenosed.   Dist LAD-1 lesion is 70% stenosed.  Dist LAD-2 lesion is 70% stenosed.   Mid LAD lesion is 60% stenosed.   Ost Cx to Prox Cx lesion is 99% stenosed.   Prox Cx to Mid Cx lesion is 70% stenosed.   Ramus-1 lesion is 60% stenosed.   Ramus-2 lesion is 20% stenosed.   Ramus-3 lesion is 70% stenosed.   RPAV lesion is 60% stenosed.   Non-stenotic Mid LAD to  Dist LAD lesion was previously treated.   LIMA and is normal in caliber.   RIMA and is normal in caliber.   The graft exhibits no disease.   Severe double vessel CAD Chronic occlusion of ostial LAD stent. The proximal, mid and distal LAD and Diagonal branches fill from the LIMA graft. The ostial Circumflex stent has severe restenosis, unchanged from last cath. Patent RIMA graft that fills the Circumflex Large dominant RCA with mild non-obstructive disease.  PCWP 24 mmHg, LVEDP 24 mmHg.     Review of Systems: [y] = yes, [ ]  = no   General: Weight gain [ ] ; Weight loss [ ] ; Anorexia [ ] ; Fatigue [ Y]; Fever [ ] ; Chills [ ] ; Weakness [Y ]  Cardiac: Chest pain/pressure [ ] ; Resting SOB [ Y]; Exertional SOB [Y ]; Orthopnea [ ] ; Pedal Edema [ ] ; Palpitations [ ] ; Syncope [ ] ; Presyncope [ ] ; Paroxysmal nocturnal dyspnea[ ]   Pulmonary: Cough [ ] ; Wheezing[ ] ; Hemoptysis[ ] ; Sputum [ ] ; Snoring [ ]   GI: Vomiting[ ] ; Dysphagia[ ] ; Melena[ ] ; Hematochezia [ ] ; Heartburn[ ] ; Abdominal pain [ ] ; Constipation [ ] ; Diarrhea [ ] ; BRBPR [ ]   GU: Hematuria[ ] ; Dysuria [ ] ; Nocturia[ ]   Vascular: Pain in legs with walking [ ] ; Pain in feet with lying flat [ ] ; Non-healing sores [ ] ; Stroke [ ] ; TIA [ ] ; Slurred speech [ ] ;  Neuro: Headaches[ ] ; Vertigo[ ] ; Seizures[ ] ; Paresthesias[ ] ;Blurred vision [ ] ; Diplopia [ ] ; Vision changes [ ]   Ortho/Skin: Arthritis [ ] ; Joint pain [ ] ; Muscle pain [ ] ; Joint swelling [ ] ; Back Pain [ ] ; Rash [ ]   Psych: Depression[ ] ; Anxiety[ ]   Heme: Bleeding problems [ ] ; Clotting disorders [ ] ; Anemia [ ]   Endocrine: Diabetes [Y ]; Thyroid dysfunction[ ]   Home Medications Prior to Admission medications   Medication Sig Start Date End Date Taking? Authorizing Provider  acetaminophen (TYLENOL) 325 MG tablet Take 650 mg by mouth daily as needed for mild pain, fever or headache.   Yes [provider]  albuterol (VENTOLIN HFA) 108 (90 Base) MCG/ACT inhaler INHALE 2  PUFFS INTO THE LUNGS EVERY 6 (SIX) HOURS AS NEEDED FOR WHEEZING OR SHORTNESS OF BREATH. 10/25/21  Yes Newlin, Enobong, MD  Alirocumab (PRALUENT) 150 MG/ML SOAJ Inject 1 pen  into the skin every 14 (fourteen) days. 09/23/21  Yes Quintella Reichert, MD  aspirin 81 MG EC tablet Take 1 tablet (81 mg total) by mouth daily. Patient taking differently: Take 81 mg by mouth at bedtime. 12/07/17  Yes Amin, Loura Halt, MD  b complex vitamins tablet Take 1 tablet by mouth 3 (three) times a week.   Yes [provider]  budesonide (PULMICORT) 0.25 MG/2ML nebulizer solution Take 0.25 mg by nebulization daily as needed (shortness of breath/wheezing).   Yes [provider]  buPROPion (WELLBUTRIN XL) 150 MG 24 hr tablet Take 1 tablet (150 mg total) by mouth every morning. 03/06/22 06/17/22 Yes Karsten Ro, MD  clopidogrel (PLAVIX) 75 MG tablet Take 1 tablet (75 mg total)  by mouth daily with breakfast. Patient taking differently: Take 75 mg by mouth at bedtime. 12/05/21  Yes Turner, Cornelious Bryant, MD  diclofenac Sodium (VOLTAREN) 1 % GEL Apply 2-4 g topically 3 (three) times daily as needed (knee pain).   Yes [provider]  diphenhydrAMINE (BENADRYL) 25 MG tablet Take 37.5 mg by mouth at bedtime.   Yes [provider]  DULoxetine (CYMBALTA) 30 MG capsule Take 1 capsule (30 mg total) by mouth at bedtime. 03/06/22  Yes Doda, Traci Sermon, MD  fluticasone (FLONASE) 50 MCG/ACT nasal spray Insert 1 spray each nostril daily for 3 days then use 2-3 times a week as needed Patient taking differently: Place 1 spray into both nostrils daily. Insert 1 spray each nostril daily for 3 days then use 2-3 times a week as needed 05/16/22  Yes [name], NP  furosemide (LASIX) 20 MG tablet Take 1 tablet (20 mg total) by mouth daily as needed (for weight gain). 10/14/20  Yes Clegg, Amy D, NP  insulin lispro (HUMALOG) 100 UNIT/ML injection Inject 0.35 mLs (35 Units total) into the skin daily via pump. 03/08/22  Yes  Carlus Pavlov, MD  levothyroxine (SYNTHROID) 100 MCG tablet Take 1 tablet (100 mcg total) by mouth daily before breakfast. 04/10/22  Yes Newlin, Enobong, MD  losartan (COZAAR) 25 MG tablet Take 1 tablet (25 mg total) by mouth daily. Patient taking differently: Take 12.5 mg by mouth daily. 01/16/22  Yes Esaw Grandchild A, DO  meloxicam (MOBIC) 15 MG tablet Take 1 tablet (15 mg total) by mouth daily. Patient taking differently: Take 15 mg by mouth daily as needed for pain. 05/17/22  Yes [name], NP  metoCLOPramide (REGLAN) 5 MG tablet Take 1 tablet (5 mg total) by mouth every 8 (eight) hours as needed for nausea or vomiting. 12/23/21 12/23/22 Yes Armbruster, Willaim Rayas, MD  metoprolol succinate (TOPROL-XL) 25 MG 24 hr tablet Take 1 tablet (25 mg total) by mouth daily. Patient taking differently: Take 12.5 mg by mouth daily. 01/16/22  Yes Esaw Grandchild A, DO  mometasone-formoterol (DULERA) 100-5 MCG/ACT AERO Inhale 2 puffs into the lungs 2 (two) times daily. 02/15/22  Yes [name], NP  montelukast (SINGULAIR) 10 MG tablet Take 1 tablet (10 mg total) by mouth at bedtime. 02/15/22  Yes [name], NP  Multiple Vitamin (MULTIVITAMIN WITH MINERALS) TABS tablet Take 1 tablet by mouth daily.   Yes [provider]  nitroGLYCERIN (NITROSTAT) 0.4 MG SL tablet Place 1 tablet (0.4 mg total) under the tongue every 5 (five) minutes as needed for chest pain. 10/14/20  Yes Clegg, Amy D, NP  omeprazole (PRILOSEC OTC) 20 MG tablet Take 20 mg by mouth daily as needed (acid reflux).   Yes [provider]  ondansetron (ZOFRAN) 4 MG tablet Take 1 tablet (4 mg total) by mouth daily as needed for nausea or vomiting. 03/24/22 03/24/23 Yes Hollice Espy, MD  pantoprazole (PROTONIX) 40 MG tablet Take 1 tablet (40 mg total) by mouth 2 (two) times daily. 03/24/22  Yes Hollice Espy, MD  Continuous Blood Gluc Sensor (DEXCOM G7 SENSOR) MISC Apply 1 sensor every 10 days 01/04/22   Carlus Pavlov, MD  Glucagon 3 MG/DOSE POWD Place 3 mg into the nose once as needed for up to 1 dose. 07/01/21   Carlus Pavlov, MD  Insulin Disposable Pump (OMNIPOD 5 G6 INTRO, GEN 5,) KIT Use as directed. 05/03/22   Carlus Pavlov, MD  Insulin Disposable Pump (OMNIPOD 5  G6 PODS, GEN 5,) MISC Use every 3 (three) days. 05/03/22   Carlus Pavlov, MD  methylcellulose (CITRUCEL) oral powder Take 1 packet by mouth daily. Patient not taking: Reported on 05/24/2022 12/23/21   Benancio Deeds, MD    Past Medical History: Past Medical History:  Diagnosis Date   Allergy    Anemia    Anxiety    Asthma    CAD S/P percutaneous coronary angioplasty 03/28/2017   Cataract    CHF (congestive heart failure) (HCC)    Coronary artery disease    a. s/p PTCA DES x3 in the LAD (ostial DES placed, mid DES placed, distal DES placed) and PTCA/DES x1 to intermediate branch 02/2017. b. ACS 02/08/18 with LCx stent placement.   Depression    Diabetes mellitus    type 1   DKA (diabetic ketoacidoses) 12/31/2017   DKA (diabetic ketoacidosis) (HCC) 01/13/2022   Dyspnea    with exertion and dust, no oxygen   Elevated platelet count    GERD (gastroesophageal reflux disease)    Heart murmur    never caused any problems   High cholesterol    Hypothyroidism    Ischemic cardiomyopathy    a. EF low-normal with basal inferior akinesis, basal septal hypokinesis by echo 01/2018.   Myocardial infarction [facility]) 2019   Nausea and vomiting 12/04/2010   Non-ST elevation (NSTEMI) myocardial infarction [facility]) 02/08/2018   NSTEMI (non-ST elevated myocardial infarction) (HCC)    Substance abuse (HCC)    marijuana    Past Surgical History: Past Surgical History:  Procedure Laterality Date   25 GAUGE PARS PLANA VITRECTOMY WITH 20 GAUGE MVR PORT Right 11/24/2021   Procedure: 25 GAUGE PARS PLANA VITRECTOMY APPROACH RIGHT EYE, FLUID FLUID EXCHANGE;  Surgeon: Carmela Rima, MD;  Location: [facility] OR;  Service: Ophthalmology;   Laterality: Right;   AIR/FLUID EXCHANGE Right 12/27/2021   Procedure: AIR/FLUID EXCHANGE;  Surgeon: Carmela Rima, MD;  Location: [facility] OR;  Service: Ophthalmology;  Laterality: Right;   APPENDECTOMY     BIOPSY  09/29/2021   Procedure: BIOPSY;  Surgeon: Benancio Deeds, MD;  Location: [facility];  Service: Gastroenterology;;   CATARACT EXTRACTION W/PHACO Right 11/24/2021   Procedure: CATARACT EXTRACTION AND INTRAOCULAR LENS PLACEMENT (IOC) RIGHT EYE;  Surgeon: Carmela Rima, MD;  Location: [facility] OR;  Service: Ophthalmology;  Laterality: Right;   COLONOSCOPY     COLONOSCOPY WITH PROPOFOL N/A 09/29/2021   Procedure: COLONOSCOPY WITH PROPOFOL;  Surgeon: Benancio Deeds, MD;  Location: [facility]COPY;  Service: Gastroenterology;  Laterality: N/A;   CORONARY ARTERY BYPASS GRAFT N/A 11/04/2018   Procedure: CORONARY ARTERY BYPASS GRAFTING (CABG), ON PUMP, TIMES THREE, USING LEFT AND RIGHT INTERNAL MAMMARY ARTERIES;  Surgeon: Linden Dolin, MD;  Location: [facility] OR;  Service: Open Heart Surgery;  Laterality: N/A;   CORONARY STENT INTERVENTION N/A 03/20/2017   Procedure: DES x 3 LAD, DES OM; Surgeon: Kathleene Hazel, MD;  Location: [facility] INVASIVE CV LAB;  Service: Cardiovascular;  Laterality: N/A;   CORONARY/GRAFT ACUTE MI REVASCULARIZATION N/A 02/08/2018   Procedure: Coronary/Graft Acute MI Revascularization;  Surgeon: Lyn Records, MD;  Location: [facility] INVASIVE CV LAB;  Service: Cardiovascular;  Laterality: N/A;   ESOPHAGOGASTRODUODENOSCOPY (EGD) WITH PROPOFOL N/A 09/29/2021   Procedure: ESOPHAGOGASTRODUODENOSCOPY (EGD) WITH PROPOFOL;  Surgeon: Benancio Deeds, MD;  Location: [facility];  Service: Gastroenterology;  Laterality: N/A;   GAS INSERTION Right 04/28/2021   Procedure: INSERTION OF GAS ( C3F8);  Surgeon: Carmela Rima, MD;  Location: [facility] OR;  Service: Ophthalmology;  Laterality: Right;  Right eye   GAS/FLUID EXCHANGE Right 04/28/2021   Procedure: GAS/FLUID EXCHANGE;  Surgeon:  Carmela Rima, MD;  Location: [facility] OR;  Service: Ophthalmology;  Laterality: Right;  Right eye   LASER PHOTO ABLATION Right 11/24/2021   Procedure: ENDO LASER PAN PHOTOCOAGULATION;  Surgeon: Carmela Rima, MD;  Location: [facility] OR;  Service: Ophthalmology;  Laterality: Right;   LASER PHOTO ABLATION Right 12/27/2021   Procedure: ENDO LASER;  Surgeon: Carmela Rima, MD;  Location: [facility] OR;  Service: Ophthalmology;  Laterality: Right;   LEFT HEART CATH AND CORONARY ANGIOGRAPHY N/A 03/20/2017   Procedure: LEFT HEART CATH AND CORONARY ANGIOGRAPHY;  Surgeon: Kathleene Hazel, MD;  Location: [facility];  Service: Cardiovascular;  Laterality: N/A;   LEFT HEART CATH AND CORONARY ANGIOGRAPHY N/A 02/08/2018   Procedure: LEFT HEART CATH AND CORONARY ANGIOGRAPHY;  Surgeon: Lyn Records, MD;  Location: [facility];  Service: Cardiovascular;  Laterality: N/A;   LEFT HEART CATH AND CORONARY ANGIOGRAPHY N/A 10/29/2018   Procedure: LEFT HEART CATH AND CORONARY ANGIOGRAPHY;  Surgeon: Kathleene Hazel, MD;  Location: [facility];  Service: Cardiovascular;  Laterality: N/A;   LEFT HEART CATH AND CORS/GRAFTS ANGIOGRAPHY N/A 10/04/2020   Procedure: LEFT HEART CATH AND CORS/GRAFTS ANGIOGRAPHY;  Surgeon: Tonny Bollman, MD;  Location: [facility] INVASIVE CV LAB;  Service: Cardiovascular;  Laterality: N/A;   LEFT HEART CATH AND CORS/GRAFTS ANGIOGRAPHY N/A 05/11/2021   Procedure: LEFT HEART CATH AND CORS/GRAFTS ANGIOGRAPHY;  Surgeon: Swazil[place], Peter M, MD;  Location: [facility] INVASIVE CV LAB;  Service: Cardiovascular;  Laterality: N/A;   PARS PLANA VITRECTOMY Right 04/28/2021   Procedure: PARS PLANA VITRECTOMY WITH 25 GAUGE REMOVAL OF TRATIONAL MEMBRANE RIGHT EYE; DRAINAGE OF SUBRETINAL FLUID RIGHT EYE;  Surgeon: Carmela Rima, MD;  Location: [facility] OR;  Service: Ophthalmology;  Laterality: Right;  Right eye   PARS PLANA VITRECTOMY 27 GAUGE Right 12/27/2021   Procedure: PARS PLANA VITRECTOMY 27 GAUGE;   Surgeon: Carmela Rima, MD;  Location: [facility] OR;  Service: Ophthalmology;  Laterality: Right;   PHOTOCOAGULATION WITH LASER Right 04/28/2021   Procedure: PHOTOCOAGULATION WITH LASER;  Surgeon: Carmela Rima, MD;  Location: [facility] OR;  Service: Ophthalmology;  Laterality: Right;  Right eye   POLYPECTOMY  09/29/2021   Procedure: POLYPECTOMY;  Surgeon: Benancio Deeds, MD;  Location: [facility];  Service: Gastroenterology;;   RIGHT/LEFT HEART CATH AND CORONARY/GRAFT ANGIOGRAPHY N/A 05/23/2022   Procedure: RIGHT/LEFT HEART CATH AND CORONARY/GRAFT ANGIOGRAPHY;  Surgeon: Kathleene Hazel, MD;  Location: [facility]VAS[facility] LAB;  Service: Cardiovascular;  Laterality: N/A;   TEE WITHOUT CARDIOVERSION N/A 11/04/2018   Procedure: TRANSESOPHAGEAL ECHOCARDIOGRAM (TEE);  Surgeon: Linden Dolin, MD;  Location: [facility] OR;  Service: Open Heart Surgery;  Laterality: N/A;    Family History: Family History  Problem Relation Age of Onset   Cancer Mother        T cell lymphoma   Hypertension Mother    Hypercalcemia Mother    OCD Father    Anxiety disorder Father    Coronary artery disease Father    Congestive Heart Failure Father    Asthma Father    Hypercalcemia Father    Hypertension Father    Colon polyps Father    Depression Maternal Grandmother    Diabetes Paternal Grandmother    Stomach cancer Neg Hx    Esophageal cancer Neg Hx    Colon cancer Neg Hx    Rectal cancer Neg Hx  Social History: Social History   Socioeconomic History   Marital status: Divorced    Spouse name: Not on file   Number of children: 0   Years of education: Not on file   Highest education level: Doctorate  Occupational History   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 3 times weekly; reports decreasing use   Sexual activity: Not Currently    Birth control/protection: None  Other Topics Concern   Not on file  Social History Narrative   Not on file   Social Determinants of Health   Financial Resource Strain: High Risk (05/15/2022)   Overall Financial Resource Strain (CARDIA)    Difficulty of Paying Living Expenses: Very hard  Food Insecurity: Food Insecurity Present (05/15/2022)   Hunger Vital Sign    Worried About Running Out of Food in the Last Year: Often true    Ran Out of Food in the Last Year: Often true  Transportation Needs: Unmet Transportation Needs (05/15/2022)   PRAPARE - Transportation    Lack of Transportation (Medical): Yes    Lack of Transportation (Non-Medical): Yes  Physical Activity: Unknown (05/15/2022)   Exercise Vital Sign    Days of Exercise per Week: 0 days    Minutes of Exercise per Session: Not on file  Stress: Stress Concern Present (05/15/2022)   [employer] of Occupational Health - Occupational Stress Questionnaire    Feeling of Stress : Very much  Social Connections: Socially Isolated (05/15/2022)   Social Connection and Isolation Panel [NHANES]    Frequency of Communication with Friends and Family: More than three times a week    Frequency of Social Gatherings with Friends and Family: Once a week    Attends Religious Services: Never    Database administrator or Organizations: No    Attends Engineer, structural: Not on file    Marital Status: Divorced    Allergies:  Allergies  Allergen Reactions   Atorvastatin Other (See Comments)    Myalgias    Crestor [Rosuvastatin Calcium] Other (See Comments)    Severe myalgias and joint pain   Monosodium Glutamate Nausea And Vomiting and Other (See Comments)    MSG -  migraine   Zetia [Ezetimibe] Other (See Comments)    myalgias   Ciprofloxin Hcl [Ciprofloxacin] Nausea And Vomiting and Other (See Comments)    Severe migraine   Food Color Red [Red Dye] Diarrhea    Objective:    Vital Signs:   Temp:  [97.6 F (36.4 C)-98.8 F (37.1 C)] 97.8 F (36.6 C) (05/01 0712) Pulse Rate:  [94-125] 94 (05/01 0712) Resp:  [12-33] 18 (05/01 0712) BP: (80-134)/(52-97) 100/68 (05/01 0712) SpO2:  [94 %-100 %] 94 % (05/01 0808) Weight:  [64.7 kg] 64.7 kg (05/01 0559) Last BM Date :  (PTA)  Weight change: Filed Weights   05/21/22 0407 05/22/22 0308 05/24/22 0559  Weight: 64.3 kg 64.3 kg 64.7 kg    Intake/Output:  Intake/Output Summary (Last 24 hours) at 05/24/2022 1012 Last data filed at 05/24/2022 0700 Gross per 24 hour  Intake 398.13 ml  Output --  Net 398.13 ml      Physical Exam    General:  Well appearing. No resp difficulty HEENT: normal Neck: supple. JVP 6. + left IJ CVC Carotids 2+ bilat; no bruits. No lymphadenopathy or thyromegaly appreciated. Cor: PMI nondisplaced. Irregular rhythm and tachy (frequent PVCs). No rubs, gallops or murmurs. Lungs: clear Abdomen: soft, nontender, nondistended. No hepatosplenomegaly. No bruits or masses. Good bowel sounds. Extremities: no cyanosis, clubbing, rash, edema Neuro: alert & orientedx3, cranial nerves grossly intact. moves all 4 extremities w/o difficulty. Affect pleasant   Telemetry   Sinus tach w/ frequent PVCs low 100s   EKG    Admit EKG ST 123 bpm, LAFB   Labs   Basic Metabolic Panel: Recent Labs  Lab 05/21/22 0248 05/21/22 0736 05/22/22 0530 05/23/22 0121 05/23/22 1127 05/23/22 1133 05/23/22 1136 05/24/22 0527  NA 135 136 138 134* 137 137 137 133*  K 3.9 4.0 3.6 3.3* 3.7 3.5 3.7 2.7*  CL 105 106 98 101  --   --   --  102  CO2 19* 19* 22 22  --   --   --  25  GLUCOSE 178* 156* 292* 245*  --   --   --  162*  BUN 17 15 22* 21*  --   --   --  12  CREATININE 1.06* 0.97 1.67*  1.11*  --   --   --  0.91  CALCIUM 8.8* 9.3 9.0 8.5*  --   --   --  8.0*  MG  --   --  1.8 2.3  --   --   --   --   PHOS  --   --  4.0  --   --   --   --   --     Liver Function Tests: Recent Labs  Lab 05/20/22 1923  AST 28  ALT 19  ALKPHOS 84  BILITOT 0.7  PROT 8.0  ALBUMIN 4.5   Recent Labs  Lab 05/20/22 1923 05/21/22 0938  LIPASE <10* 20   No results for input(s): "AMMONIA" in the last 168 hours.  CBC: Recent Labs  Lab 05/20/22 1923 05/20/22 1929 05/22/22 0530 05/23/22 0121 05/23/22 1127 05/23/22 1133 05/23/22 1136 05/24/22 0527  WBC 13.6*  --  13.9* 13.7*  --   --   --  6.0  NEUTROABS 12.4*  --   --   --   --   --   --   --   HGB 12.0   < > 10.8* 10.6* 10.5* 10.5* 10.9* 9.7*  HCT 35.8*   < > 32.7* 31.1* 31.0* 31.0* 32.0* 28.6*  MCV 88.8  --  89.8 88.4  --   --   --  89.4  PLT 477*  --  422* 373  --   --   --  304   < > = values in this interval not displayed.    Cardiac Enzymes: No results for input(s): "CKTOTAL", "CKMB", "CKMBINDEX", "TROPONINI" in the last 168 hours.  BNP: BNP (last 3 results) Recent Labs    11/03/21 1151 05/22/22 1529  BNP 127.9* >4,500.0*    ProBNP (last 3 results) No results for input(s): "PROBNP" in the last 8760 hours.   CBG: Recent Labs  Lab 05/23/22 1801 05/23/22 2014 05/23/22 2333 05/24/22 0317 05/24/22 0710  GLUCAP 56* 228*  149* 133* 162*    Coagulation Studies: No results for input(s): "LABPROT", "INR" in the last 72 hours.   Imaging   CARDIAC CATHETERIZATION  Result Date: 05/23/2022   Prox LAD to Mid LAD lesion is 100% stenosed.   Dist LAD-1 lesion is 70% stenosed.   Dist LAD-2 lesion is 70% stenosed.   Mid LAD lesion is 60% stenosed.   Ost Cx to Prox Cx lesion is 99% stenosed.   Prox Cx to Mid Cx lesion is 70% stenosed.   Ramus-1 lesion is 60% stenosed.   Ramus-2 lesion is 20% stenosed.   Ramus-3 lesion is 70% stenosed.   RPAV lesion is 60% stenosed.   Non-stenotic Mid LAD to Dist LAD lesion was  previously treated.   LIMA and is normal in caliber.   RIMA and is normal in caliber.   The graft exhibits no disease. Severe double vessel CAD Chronic occlusion of ostial LAD stent. The proximal, mid and distal LAD and Diagonal branches fill from the LIMA graft. The ostial Circumflex stent has severe restenosis, unchanged from last cath. Patent RIMA graft that fills the Circumflex Large dominant RCA with mild non-obstructive disease. PCWP 24 mmHg, LVEDP 24 mmHg. Recommendations: Continue medical management of CAD. She would benefit from diuresis. Will give one dose of IV Lasix today.     Medications:     Current Medications:  aspirin EC  81 mg Oral QHS   buPROPion  150 mg Oral [id number]   Chlorhexidine Gluconate Cloth  6 each Topical Daily   clopidogrel  75 mg Oral QHS   DULoxetine  30 mg Oral QHS   furosemide  40 mg Intravenous Once   heparin  5,000 Units Subcutaneous Q8H   levothyroxine  100 mcg Oral Q0600   mometasone-formoterol  2 puff Inhalation BID   montelukast  10 mg Oral QHS   pantoprazole  40 mg Oral BID   polyethylene glycol  17 g Oral Daily   potassium chloride  40 mEq Oral Q4H   senna-docusate  1 tablet Oral BID   sodium chloride flush  3 mL Intravenous Q12H    Infusions:  sodium chloride     sodium chloride     cefTRIAXone (ROCEPHIN)  IV     DOBUTamine 2.5 mcg/kg/min (05/23/22 1840)   norepinephrine (LEVOPHED) Adult infusion Stopped (05/22/22 1441)   potassium chloride 10 mEq (05/24/22 0831)   promethazine (PHENERGAN) injection (IM or IVPB) Stopped (05/22/22 7829)      Patient Profile   52 y.o.with HFrEF, CAD, s/p CABG in 10/2018 (LIMA to the LAD/ramus intermedius and RIMA to the obtuse marginal), DM Type I,  anemia, anxiety, hypothyroidism, and hyperlipidemia. Admitted w/ DKA. Hospitalization c/b a/c systolic heart failure w/ low output.   Assessment/Plan   1. 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   - Echo 09/2020 EF 20-25%.  - LHC with 09/2020 with severe coronary disease. Total occlusion of the proximal LAD, moderate stenosis of the intermediate branch, and severe ostial circumflex in-stent restenosis.  - Echo 12/27/20  EF 40-45% RV mildly HK - Echo 12/23 EF 45-50%  - Echo this admit EF down 20-25% w/ AK of anteroseptal wall and apex, RV severely reduced. Also w/ Hs trop elevation peaking at 5,048. LHC w/ stable coronary anatomy and 2/2 patent bypass grafts. Suspect drop in EF likely stress induced in setting of critical illness/DKA - RHC c/w low output, LV dominant shock (mRA 4, PAP  44/18, mPCWP 24, FICK CO 3.04, CI 1.85, Co-ox 46%) - now on DBA 2.5. Co-ox improved 63% today. Remains volume overloaded - continue to diuresis w/ IV Lasix. Will wait to wean DBA until fully diuresed - will add GDMT as BP permits (currently soft) - not candidate for SGLT2i w/ Type 1DM and DKA  - need to consider advanced therapies   2. CAD/NSTEMI  - h/o CABG x 2 2020, LIMA-LAD, RIMA-LCx - HS trop this admit peaked to 5048  - LHC w/ stable disease, 2/2 patent grafts. No culprit lesions  - suspect trop elevation 2/2 demand ischemia/acute HF  - continue medical management   3. DKA - Type 1 DM, developed after insulin pump malfunctioned  - now resolved - per Internal Medicine   4. Acute Hypoxic Respiratory Failure - developed post large volume IVF resuscitation  - PCWP 24 on RHC - improving, continue diuresis   5. AKI  - Scr peaked to 1.7, in setting of DKA and low output HF - improved, SCr 0.91 today  - follow w/ diuresis   6. Hypokalemia - K 2.7 w/ diuresis  - aggressive K supp - check Mg level   7. Frequent PVCs - supp K, keep > 4.0  - check Mg, keep > 2.0  - if continues despite correction of hypokalemia, may need AAD therapy    Length of Stay: [address] Sharol Harness, PA-C  05/24/2022, 10:12 AM  Advanced Heart Failure Team Pager [phone] (M-F; 7a - 5p)  Please contact [facility]  for night-coverage after hours (4p -7a ) and weekends on [email or web address]   Agree with above.   52 y/o woman with DM1, CAD s/p CABG and systolic HF due to iCM  Admitted with recurrent CP and progressive HF symptoms. NYHA IIIB-IV  Cath with severe native CAD with 2/3 CABG grafts patent. CI 1.8 with LVED 24 PAPI 6.5  ECHO EF 20-25% with severe RV dysfunction   Started on DBA. Symptoms now-improved  General:  Weak appearing. No resp difficulty HEENT: normal Neck: supple. no JVD. Carotids 2+ bilat; no bruits. No lymphadenopathy or thryomegaly appreciated. Cor: PMI nondisplaced. Regular rate & rhythm. +s3 Lungs: clear Abdomen: soft, nontender, nondistended. No hepatosplenomegaly. No bruits or masses. Good bowel sounds. Extremities: no cyanosis, clubbing, rash,1+ edema Neuro: alert & orientedx3, cranial nerves grossly intact. moves all 4 extremities w/o difficulty. Affect pleasant  She has end-stage HF dut to severe iCM with no options for further revascularization. Now improved with DBA support.   Long discussion about options for advanced HF therapies. She is interested. RV appears significantly compromised on echo but PAPI ok. Will initiate work-up for transplant with option for LVAD as back-up.   Continue DBA. Resume IV diuresis.   Arvilla Meres, MD  7:32 PM

## 2022-05-24 NOTE — TOC Benefit Eligibility Note (Signed)
Patient Product/process development scientist completed.    The patient is currently admitted and upon discharge could be taking Entresto 24-26 mg.  The current 30 day co-pay is $4.00.   The patient is currently admitted and upon discharge could be taking Farxiga 10 mg.  Requires Prior Authorization  The patient is currently admitted and upon discharge could be taking Jardiance 10 mg.  Requires Prior Authorization  The patient is insured through [employer] [place] [employer]   This test claim was processed through [facility]- copay amounts may vary at other pharmacies due to pharmacy/plan contracts, or as the patient moves through the different stages of their insurance plan.  Roland Earl, CPHT Pharmacy Patient Advocate Specialist [facility] Patient Advocate Team Direct Number: [phone]  Fax: [phone]

## 2022-05-24 NOTE — Progress Notes (Signed)
Patient self administered 2.5 units of insulin at 1142 for blood sugar of 248 per dexcom and meal coverage.

## 2022-05-24 NOTE — Progress Notes (Signed)
[facility]                                                                              Jennifer Hogan, is a 52 y.o. female, DOB - [birth year], ZOX:[id number] Admit date - 05/20/2022    Outpatient Primary MD for the patient is [name], NP  LOS - 3  days  Chief Complaint  Patient presents with   Emesis       Brief summary   Patient is a 52 year old female with diabetes mellitus type 1, on insulin pump, CAD status post CABG presented with nausea and vomiting and severe hyperglycemia.  Improved with aggressive hydration and then became short of breath and tachycardiac, was placed on 10 L O2, concern for pulmonary edema.  Patient was transferred to ICU and CCM was consulted. Significant Hospital Events:   4/28 admitted with nausea/vomiting and severe hyperglycemia in the setting of DKA due to question insulin pump failure.  Early morning patient seen with new significant oxygen requirement from room air to 10L with associated tachycardia and posterior crackles concerning for volume overload/pulmonary edema 4/29 Remains on 10L, trop to [facility] 4/30 O2 requirement down-trending, plan for cardiac cath-> severe double vessel CAD.  Recommended continue medical management, diuresis.  5/1: Transferred to floor, TRH assumed care   Assessment & Plan    Principal Problem:   Acute hypoxic respiratory failure (HCC) likely due to pulmonary edema, acute on chronic systolic CHF -On 8/11, patient had a significant new O2 requirement from room air to 10 L, + troponins, plateaued at 5048, BNP>4500 -O2 requirements now improving with diuresis. -O2 sats 98 to 99% on room air   Active Problems:   NSTEMI (non-ST elevated myocardial infarction) (HCC),  Acute on chronic systolic heart failure (HCC), CAD -History of three-vessel CAD with prior CABG 10/2018 -2D echo showed EF of 20 to 25% -Cardiology consulted, underwent cardiac cath, showed severe triple-vessel CAD, recommended  continue medical management with diuresis. -Patient was placed on aspirin, Plavix, IV heparin (now off) -CHF team will follow today currently on low-dose dobutamine drip and Lasix 40 mg x 1 on 4/30  Hypokalemia -Replaced p.o. and IV, follow magnesium level  Diabetes mellitus type 1 with DKA, uncontrolled -Initially treated with IV insulin drip and aggressive IV fluid hydration -HbA1c 8.0 on 03/24/2022, will repeat   Acute kidney injury -Creatinine 1.67 on admission, likely due to dehydration and DKA -Resolved, creatinine 0.9  Estimated body mass index is 26.09 kg/m as calculated from the following:   Height as of this encounter: 5\' 2"  (1.575 m).   Weight as of this encounter: 64.7 kg.  Code Status: Full code DVT Prophylaxis:  heparin injection 5,000 Units Start: 05/23/22 2200   Level of Care: Level of care: Progressive Family Communication: Updated patient Disposition Plan:      Remains inpatient appropriate:      Procedures:  Cardiac cath 2D echo  Consultants:   [facility] cardiology  Antimicrobials:   Anti-infectives (From admission, onward)    Start     Dose/Rate Route Frequency Ordered Stop   05/24/22 1000  cefTRIAXone (ROCEPHIN) 2 g in sodium chloride 0.9 %  100 mL IVPB        2 g 200 mL/hr over 30 Minutes Intravenous Every 24 hours 05/23/22 1012     05/21/22 0915  cefTRIAXone (ROCEPHIN) 2 g in sodium chloride 0.9 % 100 mL IVPB  Status:  Discontinued        2 g 200 mL/hr over 30 Minutes Intravenous Every 24 hours 05/21/22 0827 05/23/22 1010          Medications  aspirin EC  81 mg Oral QHS   buPROPion  150 mg Oral BH-q7a   Chlorhexidine Gluconate Cloth  6 each Topical Daily   clopidogrel  75 mg Oral QHS   DULoxetine  30 mg Oral QHS   furosemide  40 mg Intravenous Once   heparin  5,000 Units Subcutaneous Q8H   levothyroxine  100 mcg Oral Q0600   mometasone-formoterol  2 puff Inhalation BID   montelukast  10 mg Oral QHS   pantoprazole  40 mg Oral BID    polyethylene glycol  17 g Oral Daily   potassium chloride  40 mEq Oral Q4H   senna-docusate  1 tablet Oral BID   sodium chloride flush  3 mL Intravenous Q12H      Subjective:   Jennifer Hogan was seen and examined today.  Feeling a lot better today, O2 sats 94 to 99% on room air.  Denies any acute shortness of breath.  Patient denies dizziness, chest pain, abdominal pain, N/V/D/C. No acute events overnight.    Objective:   Vitals:   05/24/22 0559 05/24/22 0712 05/24/22 0808 05/24/22 1047  BP:  100/68  98/66  Pulse:  94  98  Resp:  18  19  Temp:  97.8 F (36.6 C)  97.8 F (36.6 C)  TempSrc:  Oral  Oral  SpO2:  100% 94% 98%  Weight: 64.7 kg     Height:        Intake/Output Summary (Last 24 hours) at 05/24/2022 1129 Last data filed at 05/24/2022 1020 Gross per 24 hour  Intake 380.91 ml  Output --  Net 380.91 ml     Wt Readings from Last 3 Encounters:  05/24/22 64.7 kg  05/17/22 64.3 kg  05/03/22 65.6 kg     Exam General: Alert and oriented x 3, NAD Cardiovascular: S1 S2 auscultated,  RRR Respiratory: Clear to auscultation bilaterally Gastrointestinal: Soft, nontender, nondistended, + bowel sounds Ext: no pedal edema bilaterally Neuro: no new deficits Skin: No rashes Psych: Normal affect     Data Reviewed:  I have personally reviewed following labs    CBC Lab Results  Component Value Date   WBC 6.0 05/24/2022   RBC 3.20 (L) 05/24/2022   HGB 9.7 (L) 05/24/2022   HCT 28.6 (L) 05/24/2022   MCV 89.4 05/24/2022   MCH 30.3 05/24/2022   PLT 304 05/24/2022   MCHC 33.9 05/24/2022   RDW 14.3 05/24/2022   LYMPHSABS 0.5 (L) 05/20/2022   MONOABS 0.6 05/20/2022   EOSABS 0.0 05/20/2022   BASOSABS 0.0 05/20/2022     Last metabolic panel Lab Results  Component Value Date   NA 133 (L) 05/24/2022   K 2.7 (LL) 05/24/2022   CL 102 05/24/2022   CO2 25 05/24/2022   BUN 12 05/24/2022   CREATININE 0.91 05/24/2022   GLUCOSE 162 (H) 05/24/2022   GFRNONAA >60  05/24/2022   GFRAA >60 07/14/2019   CALCIUM 8.0 (L) 05/24/2022   PHOS 4.0 05/22/2022   PROT 8.0 05/20/2022   ALBUMIN 4.5 05/20/2022  LABGLOB 2.9 02/15/2022   AGRATIO 1.6 02/15/2022   BILITOT 0.7 05/20/2022   ALKPHOS 84 05/20/2022   AST 28 05/20/2022   ALT 19 05/20/2022   ANIONGAP 6 05/24/2022    CBG (last 3)  Recent Labs    05/24/22 0317 05/24/22 0710 05/24/22 1045  GLUCAP 133* 162* 263*      Coagulation Profile: No results for input(s): "INR", "PROTIME" in the last 168 hours.   Radiology Studies: I have personally reviewed the imaging studies  CARDIAC CATHETERIZATION  Result Date: 05/23/2022   Prox LAD to Mid LAD lesion is 100% stenosed.   Dist LAD-1 lesion is 70% stenosed.   Dist LAD-2 lesion is 70% stenosed.   Mid LAD lesion is 60% stenosed.   Ost Cx to Prox Cx lesion is 99% stenosed.   Prox Cx to Mid Cx lesion is 70% stenosed.   Ramus-1 lesion is 60% stenosed.   Ramus-2 lesion is 20% stenosed.   Ramus-3 lesion is 70% stenosed.   RPAV lesion is 60% stenosed.   Non-stenotic Mid LAD to Dist LAD lesion was previously treated.   LIMA and is normal in caliber.   RIMA and is normal in caliber.   The graft exhibits no disease. Severe double vessel CAD Chronic occlusion of ostial LAD stent. The proximal, mid and distal LAD and Diagonal branches fill from the LIMA graft. The ostial Circumflex stent has severe restenosis, unchanged from last cath. Patent RIMA graft that fills the Circumflex Large dominant RCA with mild non-obstructive disease. PCWP 24 mmHg, LVEDP 24 mmHg. Recommendations: Continue medical management of CAD. She would benefit from diuresis. Will give one dose of IV Lasix today.   DG CHEST PORT 1 VIEW  Result Date: 05/22/2022 CLINICAL DATA:  Status post central line placement. EXAM: PORTABLE CHEST 1 VIEW COMPARISON:  From earlier today at 1216 hours FINDINGS: Stable heart size status post prior CABG. Left jugular central line placement with the catheter tip in the lower  SVC. No pneumothorax. Slight improvement in pulmonary edema pattern since the earlier film with central airspace edema remaining, slightly more prominent in the right perihilar lung compared to the left. No significant pleural fluid. IMPRESSION: Left jugular central line tip in lower SVC. No pneumothorax. Slight improvement in pulmonary edema pattern since the earlier film. Electronically Signed   By: Irish Lack M.D.   On: 05/22/2022 13:37   DG CHEST PORT 1 VIEW  Result Date: 05/22/2022 CLINICAL DATA:  Hypoxia EXAM: PORTABLE CHEST 1 VIEW COMPARISON:  Chest x-ray dated May 21, 2022 FINDINGS: Cardiac and mediastinal contours are unchanged post median sternotomy. Increased interstitial opacities and new central predominant airspace opacities. No large pleural effusion. No evidence of pneumothorax. IMPRESSION: Increased interstitial opacities and new central predominant airspace opacities, likely due to worsened pulmonary edema. Electronically Signed   By: Allegra Lai M.D.   On: 05/22/2022 12:28       Navil Kole M.D. [facility] 05/24/2022, 11:29 AM  Available via Epic secure chat 7am-7pm After 7 pm, please refer to night coverage provider listed on amion.

## 2022-05-24 NOTE — Telephone Encounter (Signed)
Pharmacy Patient Advocate Encounter  Insurance verification completed.    The patient is insured through [employer] [place] [employer]   The patient is currently admitted and ran test claims for the following: Farxiga, Jardiance, Entresto.  Copays and coinsurance results were relayed to Inpatient clinical team.

## 2022-05-24 NOTE — Progress Notes (Signed)
Progress Note  Patient Name: Jennifer Hogan Date of Encounter: 05/24/2022  [facility] Cardiologist: Armanda Magic, MD   Patient Profile     Subjective   Much more awake this am.  Denies any chest pain or SOB.  CVP running low but ? accurac  Inpatient Medications    Scheduled Meds:  aspirin EC  81 mg Oral QHS   buPROPion  150 mg Oral BH-q7a   Chlorhexidine Gluconate Cloth  6 each Topical Daily   clopidogrel  75 mg Oral QHS   DULoxetine  30 mg Oral QHS   furosemide  40 mg Intravenous Once   heparin  5,000 Units Subcutaneous Q8H   levothyroxine  100 mcg Oral Q0600   mometasone-formoterol  2 puff Inhalation BID   montelukast  10 mg Oral QHS   pantoprazole  40 mg Oral BID   polyethylene glycol  17 g Oral Daily   senna-docusate  1 tablet Oral BID   sodium chloride flush  3 mL Intravenous Q12H   Continuous Infusions:  sodium chloride     sodium chloride     cefTRIAXone (ROCEPHIN)  IV     DOBUTamine 2.5 mcg/kg/min (05/23/22 1840)   norepinephrine (LEVOPHED) Adult infusion Stopped (05/22/22 1441)   promethazine (PHENERGAN) injection (IM or IVPB) Stopped (05/22/22 0946)   PRN Meds: sodium chloride, sodium chloride, acetaminophen, dextrose, diclofenac Sodium, diphenhydrAMINE, fluticasone, levalbuterol, LORazepam, mouth rinse, polyethylene glycol, promethazine (PHENERGAN) injection (IM or IVPB), sodium chloride flush   Vital Signs    Vitals:   05/23/22 2300 05/24/22 0322 05/24/22 0559 05/24/22 0712  BP: (!) 88/54 (!) 96/57  100/68  Pulse: 100 (!) 102  94  Resp: 19 15  18   Temp: 98.3 F (36.8 C) 97.9 F (36.6 C)  97.8 F (36.6 C)  TempSrc: Oral Oral  Oral  SpO2: 96% 97%  100%  Weight:   64.7 kg   Height:        Intake/Output Summary (Last 24 hours) at 05/24/2022 0735 Last data filed at 05/24/2022 0700 Gross per 24 hour  Intake 571.19 ml  Output 300 ml  Net 271.19 ml      05/24/2022    5:59 AM 05/22/2022    3:08 AM 05/21/2022    4:07 AM  Last 3 Weights   Weight (lbs) 142 lb 10.2 oz 141 lb 12.1 oz 141 lb 12.1 oz  Weight (kg) 64.7 kg 64.3 kg 64.3 kg      Telemetry    Sinus tachycardia - Personally Reviewed  ECG    none - Personally Reviewed  Physical Exam   GEN: No acute distress.   Neck: No JVD Cardiac: regular and tachy, no murmurs, rubs, or gallops.  Respiratory: Clear to auscultation bilaterally. GI: Soft, nontender, non-distended  MS: No edema; No deformity. Neuro:  Nonfocal  Psych: Normal affect   Labs    High Sensitivity Troponin:   Recent Labs  Lab 05/20/22 2337 05/21/22 0248 05/21/22 0938 05/21/22 1134 05/22/22 0530  TROPONINIHS 15 300* 1,792* 2,087* 5,048*      Chemistry Recent Labs  Lab 05/20/22 1923 05/20/22 1929 05/22/22 0530 05/23/22 0121 05/23/22 1127 05/23/22 1133 05/23/22 1136 05/24/22 0527  NA 136   < > 138 134*   < > 137 137 133*  K 3.7   < > 3.6 3.3*   < > 3.5 3.7 2.7*  CL 100   < > 98 101  --   --   --  102  CO2 17*   < >  22 22  --   --   --  25  GLUCOSE 331*   < > 292* 245*  --   --   --  162*  BUN 22*   < > 22* 21*  --   --   --  12  CREATININE 1.02*   < > 1.67* 1.11*  --   --   --  0.91  CALCIUM 9.7   < > 9.0 8.5*  --   --   --  8.0*  PROT 8.0  --   --   --   --   --   --   --   ALBUMIN 4.5  --   --   --   --   --   --   --   AST 28  --   --   --   --   --   --   --   ALT 19  --   --   --   --   --   --   --   ALKPHOS 84  --   --   --   --   --   --   --   BILITOT 0.7  --   --   --   --   --   --   --   GFRNONAA >60   < > 36* 59*  --   --   --  >60  ANIONGAP 19*   < > 18* 11  --   --   --  6   < > = values in this interval not displayed.     Hematology Recent Labs  Lab 05/22/22 0530 05/23/22 0121 05/23/22 1127 05/23/22 1133 05/23/22 1136 05/24/22 0527  WBC 13.9* 13.7*  --   --   --  6.0  RBC 3.64* 3.52*  --   --   --  3.20*  HGB 10.8* 10.6*   < > 10.5* 10.9* 9.7*  HCT 32.7* 31.1*   < > 31.0* 32.0* 28.6*  MCV 89.8 88.4  --   --   --  89.4  MCH 29.7 30.1  --   --    --  30.3  MCHC 33.0 34.1  --   --   --  33.9  RDW 14.3 14.3  --   --   --  14.3  PLT 422* 373  --   --   --  304   < > = values in this interval not displayed.    BNP Recent Labs  Lab 05/22/22 1529  BNP >4,500.0*     DDimer No results for input(s): "DDIMER" in the last 168 hours.    Radiology    CARDIAC CATHETERIZATION  Result Date: 05/23/2022   Prox LAD to Mid LAD lesion is 100% stenosed.   Dist LAD-1 lesion is 70% stenosed.   Dist LAD-2 lesion is 70% stenosed.   Mid LAD lesion is 60% stenosed.   Ost Cx to Prox Cx lesion is 99% stenosed.   Prox Cx to Mid Cx lesion is 70% stenosed.   Ramus-1 lesion is 60% stenosed.   Ramus-2 lesion is 20% stenosed.   Ramus-3 lesion is 70% stenosed.   RPAV lesion is 60% stenosed.   Non-stenotic Mid LAD to Dist LAD lesion was previously treated.   LIMA and is normal in caliber.   RIMA and is normal in caliber.   The graft exhibits no disease. Severe double vessel CAD Chronic  occlusion of ostial LAD stent. The proximal, mid and distal LAD and Diagonal branches fill from the LIMA graft. The ostial Circumflex stent has severe restenosis, unchanged from last cath. Patent RIMA graft that fills the Circumflex Large dominant RCA with mild non-obstructive disease. PCWP 24 mmHg, LVEDP 24 mmHg. Recommendations: Continue medical management of CAD. She would benefit from diuresis. Will give one dose of IV Lasix today.   DG CHEST PORT 1 VIEW  Result Date: 05/22/2022 CLINICAL DATA:  Status post central line placement. EXAM: PORTABLE CHEST 1 VIEW COMPARISON:  From earlier today at 1216 hours FINDINGS: Stable heart size status post prior CABG. Left jugular central line placement with the catheter tip in the lower SVC. No pneumothorax. Slight improvement in pulmonary edema pattern since the earlier film with central airspace edema remaining, slightly more prominent in the right perihilar lung compared to the left. No significant pleural fluid. IMPRESSION: Left jugular central  line tip in lower SVC. No pneumothorax. Slight improvement in pulmonary edema pattern since the earlier film. Electronically Signed   By: Irish Lack M.D.   On: 05/22/2022 13:37   DG CHEST PORT 1 VIEW  Result Date: 05/22/2022 CLINICAL DATA:  Hypoxia EXAM: PORTABLE CHEST 1 VIEW COMPARISON:  Chest x-ray dated May 21, 2022 FINDINGS: Cardiac and mediastinal contours are unchanged post median sternotomy. Increased interstitial opacities and new central predominant airspace opacities. No large pleural effusion. No evidence of pneumothorax. IMPRESSION: Increased interstitial opacities and new central predominant airspace opacities, likely due to worsened pulmonary edema. Electronically Signed   By: Allegra Lai M.D.   On: 05/22/2022 12:28   ECHOCARDIOGRAM LIMITED  Result Date: 05/22/2022    ECHOCARDIOGRAM LIMITED REPORT   Patient Name:   Jennifer Hogan Date of Exam: 05/22/2022 Medical Rec #:  [id number]         Height:       62.0 in Accession #:    [id number]        Weight:       141.8 lb Date of Birth:  1970-10-08        BSA:          1.652 m Patient Age:    51 years          BP:           115/76 mmHg Patient Gender: F                 HR:           101 bpm. Exam Location:  Inpatient Procedure: Limited Echo and Limited Color Doppler Indications:    Dyspnea R06.00  History:        Patient has prior history of Echocardiogram examinations, most                 recent 01/15/2022. Cardiomyopathy and CHF, CAD, Prior CABG,                 Signs/Symptoms:Dyspnea; Risk Factors:Diabetes and Dyslipidemia.  Sonographer:    Lucendia Herrlich Referring Phys: [phone] WHITNEY D HARRIS  Sonographer Comments: Image acquisition challenging due to uncooperative patient. IMPRESSIONS  1. Only limited images obtained due to patient not cooperating; akinesis of the anteroseptal wall and apex with overall severe LV dysfunction; consider stress induced cardiomyopathy.  2. Left ventricular ejection fraction, by estimation, is 20 to  25%. The left ventricle has severely decreased function. The left ventricle demonstrates regional wall motion abnormalities (see scoring diagram/findings for description).  3. Right ventricular systolic  function is severely reduced. The right ventricular size is normal.  4. Left atrial size was mildly dilated.  5. The mitral valve is normal in structure. Mild mitral valve regurgitation.  6. The aortic valve is tricuspid. Aortic valve regurgitation is not visualized. No aortic stenosis is present. FINDINGS  Left Ventricle: Left ventricular ejection fraction, by estimation, is 20 to 25%. The left ventricle has severely decreased function. The left ventricle demonstrates regional wall motion abnormalities. The left ventricular internal cavity size was normal  in size. There is no left ventricular hypertrophy. Right Ventricle: The right ventricular size is normal. Right ventricular systolic function is severely reduced. Left Atrium: Left atrial size was mildly dilated. Right Atrium: Right atrial size was not well visualized. Pericardium: There is no evidence of pericardial effusion. Mitral Valve: The mitral valve is normal in structure. Mild mitral valve regurgitation. Tricuspid Valve: The tricuspid valve is normal in structure. Aortic Valve: The aortic valve is tricuspid. Aortic valve regurgitation is not visualized. No aortic stenosis is present. Pulmonic Valve: The pulmonic valve was normal in structure. Aorta: The aortic root is normal in size and structure. Additional Comments: Only limited images obtained due to patient not cooperating; akinesis of the anteroseptal wall and apex with overall severe LV dysfunction; consider stress induced cardiomyopathy.  LEFT VENTRICLE PLAX 2D LVIDd:         4.50 cm LVIDs:         3.70 cm LV PW:         0.60 cm LV IVS:        0.90 cm LVOT diam:     1.70 cm LVOT Area:     2.27 cm  LEFT ATRIUM         Index LA diam:    3.80 cm 2.30 cm/m   AORTA Ao Root diam: 3.00 cm Ao Asc diam:   3.00 cm  SHUNTS Systemic Diam: 1.70 cm Olga Millers MD Electronically signed by Olga Millers MD Signature Date/Time: 05/22/2022/11:11:06 AM    Final     Patient Profile     52 y.o. female with a hx of CAD status post CABG (LIMA to LAD/RI and RIMA to OM) '20, diabetes, hyperlipidemia who is being seen 05/22/2022 for the evaluation of elevated troponin at the request of Dr. Chestine Spore.   Assessment & Plan    Jennifer Hogan is a 52 y.o. female with a hx of CAD status post CABG (LIMA to LAD/RI and RIMA to OM) '20, diabetes, hyperlipidemia who is being seen 05/22/2022 for the evaluation of elevated troponin at the request of Dr. Chestine Spore.   NSTEMI CAD s/p CABG -- presented with elevated CBGs and found to have DKA. hsTn trend of 15>>300>>1792>>2087>>5048. EKG showed ST depression in inferolateral leads, improved with repeat tracings. Denies any over anginal symptoms, but does complain of a GERD like sensation in her chest. Cath 04/2021 with severe native vessel CAD, but patent LIMA/RIMA with rec's for medical therapy -- as noted below, EF now declined to 20-25% with severely reduce RV with anteroseptal/apex akinesis. 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  --She is n.p.o. for right and left heart cath today to redefine coronary anatomy as well as assess filling pressures -Continue aspirin 81 mg daily, Plavix 75 mg daily  -She is statin intolerant -Shared Decision Making/Informed Consent{       :[id number] The risks [stroke (1 in 1000), death (1 in 1000), kidney failure [facility]  temporary] (1 in 500), bleeding (1 in 200), allergic reaction [possibly serious] (1 in 200)], benefits (diagnostic support and management of coronary artery disease) and alternatives of a cardiac catheterization were discussed in detail with Ms. Elson Clan and she is willing to proceed.   BiV HF -- Echo this admission showed decline in LVEF of 20-25% with severely  reduced RV function. Does not appear volume overloaded on exam, resting comfortably. -- Chest x-ray showed increased interstitial opacities likely due to worsening pulmonary edema.  BNP > 4500.  She is currently on no diuretic therapy.  Serum creatinine on admission was 1.02 bumped to 1.67 yesterday but is improved to 1.11 today.   --BP remains stable but continues to be tachycardic --Picture somewhat confusing as chest x-ray shows volume overload with pulmonary edema but CVP is running at 3  --Co. oximetry yesterday was 60.1 and this morning 53 --Suspect she is going to require inotropic therapy but will hold off until right and left heart cath have been completed and we have an accurate assessment of filling pressures --Diuretics per findings on heart cath  -GDMT has been limited due to soft BP. At home she was on losartan 25 mg daily Toprol-XL 25 mg daily with both are on hold at this time and currently placed on Midodrine which I will titrate down since BP looks good -no Cleda Daub as she got hyperkalemia in the past  DKA DMI -- treated with IV insulin, IVFs. Now back on insulin pump -- per primary   AKI -- Cr 1.06>>0.97>>1.67 -- Serum creatinine proved 1.1 today   For questions or updates, please contact [facility] Please consult [email or web address] for contact info under   I have spent a total of 30 minutes with patient reviewing 2D echo , telemetry, EKGs, labs and examining patient as well as establishing an assessment and plan that was discussed with the patient.  > 50% of time was spent in direct patient care.        Signed, Armanda Magic, MD  05/23/22 10/10AM

## 2022-05-24 NOTE — Progress Notes (Signed)
Pt in no distress on room air, SAT 100%. Pt does not want to wear bipap.

## 2022-05-24 NOTE — Progress Notes (Signed)
Patient self administered 2.5 units of insulin via insulin pump at 0715.

## 2022-05-24 NOTE — Progress Notes (Signed)
Potassium is 2.7.  will page Cardiologist covering  this AM. Day shift RN made aware.

## 2022-05-25 DIAGNOSIS — I5023 Acute on chronic systolic (congestive) heart failure: Secondary | ICD-10-CM | POA: Diagnosis not present

## 2022-05-25 DIAGNOSIS — I214 Non-ST elevation (NSTEMI) myocardial infarction: Secondary | ICD-10-CM | POA: Diagnosis not present

## 2022-05-25 DIAGNOSIS — E1010 Type 1 diabetes mellitus with ketoacidosis without coma: Secondary | ICD-10-CM | POA: Diagnosis not present

## 2022-05-25 DIAGNOSIS — J9601 Acute respiratory failure with hypoxia: Secondary | ICD-10-CM | POA: Diagnosis not present

## 2022-05-25 LAB — BASIC METABOLIC PANEL
Anion gap: 11 (ref 5–15)
BUN: 8 mg/dL (ref 6–20)
CO2: 27 mmol/L (ref 22–32)
Calcium: 8.6 mg/dL — ABNORMAL LOW (ref 8.9–10.3)
Chloride: 100 mmol/L (ref 98–111)
Creatinine, Ser: 0.96 mg/dL (ref 0.44–1.00)
GFR, Estimated: >60 mL/min (ref >60)
Glucose, Bld: 166 mg/dL — ABNORMAL HIGH (ref 70–99)
Potassium: 4.4 mmol/L (ref 3.5–5.1)
Sodium: 138 mmol/L (ref 135–145)

## 2022-05-25 LAB — GLUCOSE, CAPILLARY
Glucose-Capillary: 153 mg/dL — ABNORMAL HIGH (ref 70–99)
Glucose-Capillary: 180 mg/dL — ABNORMAL HIGH (ref 70–99)
Glucose-Capillary: 201 mg/dL — ABNORMAL HIGH (ref 70–99)
Glucose-Capillary: 173 mg/dL — ABNORMAL HIGH (ref 70–99)
Glucose-Capillary: 107 mg/dL — ABNORMAL HIGH (ref 70–99)
Glucose-Capillary: 54 mg/dL — ABNORMAL LOW (ref 70–99)
Glucose-Capillary: 164 mg/dL — ABNORMAL HIGH (ref 70–99)

## 2022-05-25 LAB — COOXEMETRY PANEL
Carboxyhemoglobin: 0.6 % (ref 0.5–1.5)
Methemoglobin: 0.9 % (ref 0.0–1.5)
O2 Saturation: 54.6 %
Total hemoglobin: 13.1 g/dL (ref 12.0–16.0)

## 2022-05-25 LAB — CULTURE, BLOOD (ROUTINE X 2)

## 2022-05-25 MED ORDER — SPIRONOLACTONE 12.5 MG HALF TABLET
12.5000 mg | ORAL_TABLET | Freq: Every day | ORAL | Status: DC
  Administered 2022-05-25 – 2022-05-28 (×4): 12.5 mg via ORAL
  Filled 2022-05-25 (×4): qty 1

## 2022-05-25 MED ORDER — FUROSEMIDE 20 MG PO TABS
20.0000 mg | ORAL_TABLET | Freq: Every day | ORAL | Status: DC
  Administered 2022-05-25 – 2022-05-28 (×4): 20 mg via ORAL
  Filled 2022-05-25 (×4): qty 1

## 2022-05-25 MED ORDER — TRAZODONE HCL 50 MG PO TABS
100.0000 mg | ORAL_TABLET | Freq: Every day | ORAL | Status: DC
  Administered 2022-05-25 – 2022-05-31 (×6): 100 mg via ORAL
  Filled 2022-05-25 (×6): qty 2

## 2022-05-25 NOTE — Progress Notes (Signed)
Hypoglycemic Event  CBG: 54 at 1844   Treatment: 8 oz juice/soda  Symptoms: None  Follow-up CBG: Time:1921 CBG Result:10  Possible Reasons for Event: Inadequate meal intake

## 2022-05-25 NOTE — TOC Initial Note (Signed)
Transition of Care [facility]) - Initial/Assessment Note    Patient Details  Name: Jennifer Hogan MRN: [id number] Date of Birth: December 18, 1970  [facility]) CM/SW Contact:    Elliot Cousin, RN Phone Number: [phone] 05/25/2022, 12:17 PM  Clinical Narrative:    HF TOC CM spoke to pt and states she lives at home alone. States her friend, Olegario Messier assist her home and take to appts. She just received her disability and her finance should improve. Will continue to follow for dc needs. Pt has a scale at home for daily weights.                Expected Discharge Plan: Home/Self Care Barriers to Discharge: Continued Medical Work up   Patient Goals and CMS Choice Patient states their goals for this hospitalization and ongoing recovery are:: wants to remain independent and well   Expected Discharge Plan and Services   Discharge Planning Services: CM Consult   Living arrangements for the past 2 months: Single Family Home                   Prior Living Arrangements/Services Living arrangements for the past 2 months: Single Family Home Lives with:: Self Patient language and need for interpreter reviewed:: Yes Do you feel safe going back to the place where you live?: Yes      Need for Family Participation in Patient Care: No (Comment) Care giver support system in place?: No (comment)   Criminal Activity/Legal Involvement Pertinent to Current Situation/Hospitalization: No - Comment as needed  Activities of Daily Living      Permission Sought/Granted Permission sought to share information with : Case Manager, Family Supports, PCP Permission granted to share information with : Yes, Verbal Permission Granted  Share Information with NAME: Wilnette Kales     Permission granted to share info w Relationship: friend  Permission granted to share info w Contact Information: [phone]  Emotional Assessment Appearance:: Appears stated age Attitude/Demeanor/Rapport:  Engaged Affect (typically observed): Accepting Orientation: : Oriented to Self, Oriented to Place, Oriented to  Time, Oriented to Situation   Psych Involvement: No (comment)  Admission diagnosis:  DKA, type 1 (HCC) [E10.10] DKA (diabetic ketoacidosis) (HCC) [E11.10] Patient Active Problem List   Diagnosis Date Noted   DKA (diabetic ketoacidosis) (HCC) 05/21/2022   DKA, type 1 (HCC) 05/20/2022   Proctitis 03/23/2022   S/P CABG x 3 03/23/2022   Hiatal hernia 03/23/2022   Coronary artery disease due to lipid rich plaque 02/19/2022   Vitreous hemorrhage, right (HCC) 01/14/2022   MDD (major depressive disorder), recurrent episode, severe (HCC) 08/24/2021   GAD (generalized anxiety disorder) 08/24/2021   Acute hypoxic respiratory failure (HCC) 05/11/2021   DCM (dilated cardiomyopathy) (HCC)    Takotsubo cardiomyopathy    Acute systolic heart failure (HCC)    Postoperative seroma of ear after procedure on ear 12/12/2019   Cellulitis of auricle of left ear 12/01/2019   Altered bowel habits 11/28/2018   Acute on chronic systolic heart failure (HCC)    Cardiomyopathy (HCC)    Moderate episode of recurrent major depressive disorder (HCC) 03/27/2018   Hyperglycemia due to type 1 diabetes mellitus (HCC) 02/14/2018   Hyperlipidemia LDL goal <70 02/14/2018   S/P drug eluting coronary stent placement    Non-ST elevation (NSTEMI) myocardial infarction (HCC) 02/08/2018   Marijuana abuse 12/31/2017   Gastroparesis 12/06/2017   Gastroparesis due to DM (HCC) 03/28/2017   Ischemic cardiomyopathy 03/28/2017   Pure hypercholesterolemia  NSTEMI (non-ST elevated myocardial infarction) (HCC)    Anemia 03/17/2017   Allergic state 08/07/2015   Vitiligo 07/09/2012   Type 1 diabetes mellitus (HCC) 07/09/2012   Depression 12/04/2010   Hypothyroidism 12/04/2010   PCP:  [name], NP Pharmacy:   [facility] [address], [address] [place] [address] Phone: [phone] Fax: [phone]     Social Determinants of Health (SDOH) Social History: SDOH Screenings   Food Insecurity: Food Insecurity Present (05/15/2022)  Housing: High Risk (05/15/2022)  Transportation Needs: Unmet Transportation Needs (05/15/2022)  Utilities: Not At Risk (01/14/2022)  Alcohol Screen: Low Risk  (10/05/2020)  Depression (PHQ2-9): High Risk (02/15/2022)  Financial Resource Strain: High Risk (05/15/2022)  Physical Activity: Unknown (05/15/2022)  Social Connections: Socially Isolated (05/15/2022)  Stress: Stress Concern Present (05/15/2022)  Tobacco Use: Medium Risk (05/24/2022)   SDOH Interventions:     Readmission Risk Interventions    06/01/2021   10:05 AM  Readmission Risk Prevention Plan  Transportation Screening Complete  HRI or Home Care Consult Complete  Social Work Consult for Recovery Care Planning/Counseling Complete  Palliative Care Screening Not Applicable  Medication Review Oceanographer) Complete

## 2022-05-25 NOTE — Progress Notes (Signed)
Pt does not want to wear cpap at this time. Not in any distress.

## 2022-05-25 NOTE — Progress Notes (Signed)
[facility]                                                                              Jennifer Hogan, is a 52 y.o. female, DOB - 19-Jun-1970, ZOX:[id number] Admit date - 05/20/2022    Outpatient Primary MD for the patient is [name], NP  LOS - 3  days  Chief Complaint  Patient presents with   Emesis       Brief summary   Patient is a 52 year old female with diabetes mellitus type 1, on insulin pump, CAD status post CABG presented with nausea and vomiting and severe hyperglycemia.  Improved with aggressive hydration and then became short of breath and tachycardiac, was placed on 10 L O2, concern for pulmonary edema.  Patient was transferred to ICU and CCM was consulted. Significant Hospital Events:   4/28 admitted with nausea/vomiting and severe hyperglycemia in the setting of DKA due to question insulin pump failure.  Early morning patient seen with new significant oxygen requirement from room air to 10L with associated tachycardia and posterior crackles concerning for volume overload/pulmonary edema 4/29 Remains on 10L, trop to [facility] 4/30 O2 requirement down-trending, plan for cardiac cath-> severe double vessel CAD.  Recommended continue medical management, diuresis.  5/1: Transferred to floor, TRH assumed care   Assessment & Plan    Principal Problem:   Acute hypoxic respiratory failure (HCC) likely due to pulmonary edema, acute on chronic systolic CHF -On 8/11, patient had a significant new O2 requirement from room air to 10 L, + troponins, plateaued at 5048, BNP>4500 -Improving, O2 sats 99% on room air this morning   Active Problems:   NSTEMI (non-ST elevated myocardial infarction) (HCC),  Acute on chronic systolic heart failure (HCC), CAD -History of three-vessel CAD with prior CABG 10/2018 -2D echo showed EF of 20 to 25% -Cardiac cath 05/23/2022 showed severe triple-vessel CAD, recommended continue medical management with diuresis. -Patient was  placed on aspirin, Plavix, IV heparin (now off) -CHF team following, on low-dose dobutamine drip, received Lasix 40 mg x 1 on 4/30, will follow cardiology recommendations regarding diuresis -End-stage CHF, due to severe ICM, per cardiology no options for revascularization, starting workup for transplant with option for LVAD as backup  Hypokalemia -Replace as needed  Diabetes mellitus type 1 with DKA, uncontrolled -Initially treated with IV insulin drip and aggressive IV fluid hydration -Hemoglobin A1c 7.8.  It was 8.0 on 3/1.  Back on insulin pump. CBG (last 3)  Recent Labs    05/25/22 0300 05/25/22 0741 05/25/22 1103  GLUCAP 153* 180* 201*  -Diabetic coordinator consult.   Acute kidney injury -Creatinine 1.67 on admission, likely due to dehydration and DKA -Resolved, creatinine 0.9  Normocytic anemia -MCV 89.4, baseline 10-13 -Close to baseline, closely follow.  Estimated body mass index is 26.09 kg/m as calculated from the following:   Height as of this encounter: 5\' 2"  (1.575 m).   Weight as of this encounter: 64.7 kg.  Code Status: Full code DVT Prophylaxis:  heparin injection 5,000 Units Start: 05/23/22 2200   Level of Care: Level of care: Progressive Family Communication: Updated patient's family member in  the room Disposition Plan:      Remains inpatient appropriate:      Procedures:  Cardiac cath 2D echo  Consultants:   CCM cardiology  Antimicrobials:   Anti-infectives (From admission, onward)    Start     Dose/Rate Route Frequency Ordered Stop   05/24/22 1000  cefTRIAXone (ROCEPHIN) 2 g in sodium chloride 0.9 % 100 mL IVPB        2 g 200 mL/hr over 30 Minutes Intravenous Every 24 hours 05/23/22 1012     05/21/22 0915  cefTRIAXone (ROCEPHIN) 2 g in sodium chloride 0.9 % 100 mL IVPB  Status:  Discontinued        2 g 200 mL/hr over 30 Minutes Intravenous Every 24 hours 05/21/22 0827 05/23/22 1010          Medications  aspirin EC  81 mg Oral  QHS   buPROPion  150 mg Oral BH-q7a   Chlorhexidine Gluconate Cloth  6 each Topical Daily   clopidogrel  75 mg Oral QHS   DULoxetine  30 mg Oral QHS   furosemide  40 mg Intravenous Once   heparin  5,000 Units Subcutaneous Q8H   levothyroxine  100 mcg Oral Q0600   mometasone-formoterol  2 puff Inhalation BID   montelukast  10 mg Oral QHS   pantoprazole  40 mg Oral BID   polyethylene glycol  17 g Oral Daily   potassium chloride  40 mEq Oral Q4H   senna-docusate  1 tablet Oral BID   sodium chloride flush  3 mL Intravenous Q12H      Subjective:   Hang Jennifer Hogan was seen and examined today.  Feeling better, no acute complaints.  O2 sats stable on room air.  Denies any chest pain or shortness of breath.  No acute events overnight.  No nausea vomiting abdominal pain.  Tolerating diet.  Objective:   Vitals:   05/24/22 0559 05/24/22 0712 05/24/22 0808 05/24/22 1047  BP:  100/68  98/66  Pulse:  94  98  Resp:  18  19  Temp:  97.8 F (36.6 C)  97.8 F (36.6 C)  TempSrc:  Oral  Oral  SpO2:  100% 94% 98%  Weight: 64.7 kg     Height:        Intake/Output Summary (Last 24 hours) at 05/24/2022 1129 Last data filed at 05/24/2022 1020 Gross per 24 hour  Intake 380.91 ml  Output --  Net 380.91 ml     Wt Readings from Last 3 Encounters:  05/24/22 64.7 kg  05/17/22 64.3 kg  05/03/22 65.6 kg   Physical Exam General: Alert and oriented x 3, NAD Cardiovascular: S1 S2 clear, RRR.  Respiratory: CTAB, no wheezing, rales or rhonchi Gastrointestinal: Soft, nontender, nondistended, NBS Ext: no pedal edema bilaterally Neuro: no new deficits Psych: Normal affect     Data Reviewed:  I have personally reviewed following labs    CBC Lab Results  Component Value Date   WBC 6.0 05/24/2022   RBC 3.20 (L) 05/24/2022   HGB 9.7 (L) 05/24/2022   HCT 28.6 (L) 05/24/2022   MCV 89.4 05/24/2022   MCH 30.3 05/24/2022   PLT 304 05/24/2022   MCHC 33.9 05/24/2022   RDW 14.3 05/24/2022    LYMPHSABS 0.5 (L) 05/20/2022   MONOABS 0.6 05/20/2022   EOSABS 0.0 05/20/2022   BASOSABS 0.0 05/20/2022     Last metabolic panel Lab Results  Component Value Date   NA 133 (L) 05/24/2022   K  2.7 (LL) 05/24/2022   CL 102 05/24/2022   CO2 25 05/24/2022   BUN 12 05/24/2022   CREATININE 0.91 05/24/2022   GLUCOSE 162 (H) 05/24/2022   GFRNONAA >60 05/24/2022   GFRAA >60 07/14/2019   CALCIUM 8.0 (L) 05/24/2022   PHOS 4.0 05/22/2022   PROT 8.0 05/20/2022   ALBUMIN 4.5 05/20/2022   LABGLOB 2.9 02/15/2022   AGRATIO 1.6 02/15/2022   BILITOT 0.7 05/20/2022   ALKPHOS 84 05/20/2022   AST 28 05/20/2022   ALT 19 05/20/2022   ANIONGAP 6 05/24/2022    CBG (last 3)  Recent Labs    05/24/22 0317 05/24/22 0710 05/24/22 1045  GLUCAP 133* 162* 263*      Coagulation Profile: No results for input(s): "INR", "PROTIME" in the last 168 hours.   Radiology Studies: I have personally reviewed the imaging studies  CARDIAC CATHETERIZATION  Result Date: 05/23/2022   Prox LAD to Mid LAD lesion is 100% stenosed.   Dist LAD-1 lesion is 70% stenosed.   Dist LAD-2 lesion is 70% stenosed.   Mid LAD lesion is 60% stenosed.   Ost Cx to Prox Cx lesion is 99% stenosed.   Prox Cx to Mid Cx lesion is 70% stenosed.   Ramus-1 lesion is 60% stenosed.   Ramus-2 lesion is 20% stenosed.   Ramus-3 lesion is 70% stenosed.   RPAV lesion is 60% stenosed.   Non-stenotic Mid LAD to Dist LAD lesion was previously treated.   LIMA and is normal in caliber.   RIMA and is normal in caliber.   The graft exhibits no disease. Severe double vessel CAD Chronic occlusion of ostial LAD stent. The proximal, mid and distal LAD and Diagonal branches fill from the LIMA graft. The ostial Circumflex stent has severe restenosis, unchanged from last cath. Patent RIMA graft that fills the Circumflex Large dominant RCA with mild non-obstructive disease. PCWP 24 mmHg, LVEDP 24 mmHg. Recommendations: Continue medical management of CAD. She would  benefit from diuresis. Will give one dose of IV Lasix today.   DG CHEST PORT 1 VIEW  Result Date: 05/22/2022 CLINICAL DATA:  Status post central line placement. EXAM: PORTABLE CHEST 1 VIEW COMPARISON:  From earlier today at 1216 hours FINDINGS: Stable heart size status post prior CABG. Left jugular central line placement with the catheter tip in the lower SVC. No pneumothorax. Slight improvement in pulmonary edema pattern since the earlier film with central airspace edema remaining, slightly more prominent in the right perihilar lung compared to the left. No significant pleural fluid. IMPRESSION: Left jugular central line tip in lower SVC. No pneumothorax. Slight improvement in pulmonary edema pattern since the earlier film. Electronically Signed   By: Irish Lack M.D.   On: 05/22/2022 13:37   DG CHEST PORT 1 VIEW  Result Date: 05/22/2022 CLINICAL DATA:  Hypoxia EXAM: PORTABLE CHEST 1 VIEW COMPARISON:  Chest x-ray dated May 21, 2022 FINDINGS: Cardiac and mediastinal contours are unchanged post median sternotomy. Increased interstitial opacities and new central predominant airspace opacities. No large pleural effusion. No evidence of pneumothorax. IMPRESSION: Increased interstitial opacities and new central predominant airspace opacities, likely due to worsened pulmonary edema. Electronically Signed   By: Allegra Lai M.D.   On: 05/22/2022 12:28       Vadis Slabach M.D. [facility] 05/24/2022, 11:29 AM  Available via Epic secure chat 7am-7pm After 7 pm, please refer to night coverage provider listed on amion.

## 2022-05-25 NOTE — Progress Notes (Signed)
Mobility Specialist: Progress Note   05/25/22 1747  Mobility  Activity Ambulated with assistance in hallway  Level of Assistance Standby assist, set-up cues, supervision of patient - no hands on  Assistive Device Other (Comment) (IV pole)  Distance Ambulated (ft) 700 ft  Activity Response Tolerated well  Mobility Referral Yes  $Mobility charge 1 Mobility   During Mobility: 150 (max) HR Post-Mobility: 104 HR, 100% SpO2  Received pt in chair having no complaints and agreeable to mobility. Pt was asymptomatic throughout ambulation and returned to room w/o fault. Left in bed w/ call bell in reach and all needs met.  Larita Deremer Mobility Specialist Please contact via SecureChat or Rehab office at [phone]

## 2022-05-25 NOTE — Progress Notes (Signed)
RT note.  Patient currently on rm air sat 98% with no labored breathing and stable VS. No bipap needed at this time. RT will continue to monitor.    05/25/22 0834  Therapy Vitals  Pulse Rate 98  Resp 20  MEWS Score/Color  MEWS Score 0  MEWS Score Color Green  Respiratory Assessment  Assessment Type Post-treatment  Respiratory Pattern Regular;Unlabored  Chest Assessment Chest expansion symmetrical  Bilateral Breath Sounds Clear;Diminished  Oxygen Therapy/Pulse Ox  O2 Device Room Air  SpO2 98 %

## 2022-05-25 NOTE — Progress Notes (Addendum)
Advanced Heart Failure Rounding Note  PCP-Cardiologist: Armanda Magic, MD   Subjective:     CO-OX marginal, 55%, on DBA 2.5  She feels well. No dyspnea at rest. No orthopnea or PND.   Output not charted but reports good diuresis with IV lasix.  ? CVP 1, good waveform   Objective:   Weight Range: 64.8 kg Body mass index is 26.13 kg/m.   Vital Signs:   Temp:  [97.8 F (36.6 C)-98.7 F (37.1 C)] 98.2 F (36.8 C) (05/02 0744) Pulse Rate:  [96-120] 96 (05/02 0744) Resp:  [15-19] 18 (05/02 0744) BP: (98-120)/(65-81) 120/81 (05/02 0744) SpO2:  [94 %-100 %] 97 % (05/02 0744) Weight:  [64.8 kg] 64.8 kg (05/02 0500) Last BM Date : 05/24/22  Weight change: Filed Weights   05/22/22 0308 05/24/22 0559 05/25/22 0500  Weight: 64.3 kg 64.7 kg 64.8 kg    Intake/Output:   Intake/Output Summary (Last 24 hours) at 05/25/2022 0759 Last data filed at 05/24/2022 1844 Gross per 24 hour  Intake 427.69 ml  Output --  Net 427.69 ml      Physical Exam    General:  Well appearing. Sitting up in bed HEENT: Normal Neck: Supple. JVP not elevated. Carotids 2+ bilat; no bruits. R IJ CVC Cor: PMI nondisplaced. Regular rate & rhythm. No rubs, gallops or murmurs. Lungs: Clear Abdomen: Soft, nontender, nondistended.  Extremities: No cyanosis, clubbing, rash, edema Neuro: Alert & orientedx3. Affect pleasant   Telemetry   SR 90s  Labs    CBC Recent Labs    05/23/22 0121 05/23/22 1127 05/23/22 1136 05/24/22 0527  WBC 13.7*  --   --  6.0  HGB 10.6*   < > 10.9* 9.7*  HCT 31.1*   < > 32.0* 28.6*  MCV 88.4  --   --  89.4  PLT 373  --   --  304   < > = values in this interval not displayed.   Basic Metabolic Panel Recent Labs    19/14/78 0121 05/23/22 1127 05/24/22 0527 05/24/22 1218 05/25/22 0615  NA 134*   < > 133*  --  138  K 3.3*   < > 2.7* 3.3* 4.4  CL 101  --  102  --  100  CO2 22  --  25  --  27  GLUCOSE 245*  --  162*  --  166*  BUN 21*  --  12  --  8   CREATININE 1.11*  --  0.91  --  0.96  CALCIUM 8.5*  --  8.0*  --  8.6*  MG 2.3  --   --  1.9  --    < > = values in this interval not displayed.   Liver Function Tests No results for input(s): "AST", "ALT", "ALKPHOS", "BILITOT", "PROT", "ALBUMIN" in the last 72 hours. No results for input(s): "LIPASE", "AMYLASE" in the last 72 hours. Cardiac Enzymes No results for input(s): "CKTOTAL", "CKMB", "CKMBINDEX", "TROPONINI" in the last 72 hours.  BNP: BNP (last 3 results) Recent Labs    11/03/21 1151 05/22/22 1529  BNP 127.9* >4,500.0*    ProBNP (last 3 results) No results for input(s): "PROBNP" in the last 8760 hours.   D-Dimer No results for input(s): "DDIMER" in the last 72 hours. Hemoglobin A1C Recent Labs    05/24/22 1218  HGBA1C 7.8*   Fasting Lipid Panel No results for input(s): "CHOL", "HDL", "LDLCALC", "TRIG", "CHOLHDL", "LDLDIRECT" in the last 72 hours. Thyroid Function Tests No  results for input(s): "TSH", "T4TOTAL", "T3FREE", "THYROIDAB" in the last 72 hours.  Invalid input(s): "FREET3"  Other results:   Imaging    No results found.   Medications:     Scheduled Medications:  aspirin EC  81 mg Oral QHS   buPROPion  150 mg Oral BH-q7a   Chlorhexidine Gluconate Cloth  6 each Topical Daily   clopidogrel  75 mg Oral QHS   DULoxetine  30 mg Oral QHS   heparin  5,000 Units Subcutaneous Q8H   insulin pump   Subcutaneous TID WC, HS, 0200   levothyroxine  100 mcg Oral Q0600   mometasone-formoterol  2 puff Inhalation BID   montelukast  10 mg Oral QHS   pantoprazole  40 mg Oral BID   polyethylene glycol  17 g Oral Daily   senna-docusate  1 tablet Oral BID   sodium chloride flush  3 mL Intravenous Q12H    Infusions:  sodium chloride     sodium chloride     cefTRIAXone (ROCEPHIN)  IV Stopped (05/24/22 1208)   DOBUTamine 2.5 mcg/kg/min (05/24/22 1844)   promethazine (PHENERGAN) injection (IM or IVPB) Stopped (05/22/22 0946)    PRN  Medications: sodium chloride, sodium chloride, acetaminophen, dextrose, diclofenac Sodium, diphenhydrAMINE, fluticasone, levalbuterol, mouth rinse, polyethylene glycol, promethazine (PHENERGAN) injection (IM or IVPB), sodium chloride flush    Patient Profile   52 y.o.with HFrEF, CAD, s/p CABG in 10/2018 (LIMA to the LAD/ramus intermedius and RIMA to the obtuse marginal), DM Type I,  anemia, anxiety, hypothyroidism, and hyperlipidemia. Admitted w/ DKA. Hospitalization c/b a/c systolic heart failure w/ low output.   Assessment/Plan    1. 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  - Echo 09/2020 EF 20-25%.  - LHC with 09/2020 with severe coronary disease. Total occlusion of the proximal LAD, moderate stenosis of the intermediate branch, and severe ostial circumflex in-stent restenosis.  - Echo 12/27/20  EF 40-45% RV mildly HK - Echo 12/23 EF 45-50%  - Echo this admit EF down 20-25% w/ AK of anteroseptal wall and apex, RV severely reduced. Also w/ Hs trop elevation peaking at 5,048. LHC w/ stable coronary anatomy and 2/2 patent bypass grafts. Suspect drop in EF likely stress induced in setting of critical illness/DKA - RHC c/w low output, LV dominant shock (mRA 4, PAP 44/18, mPCWP 24, LVEDP 24, FICK CO 3.04, CI 1.85, Co-ox 46%) - now on DBA 2.5. Co-ox marginal 55% today. Continue DBA today, will start wean tomorrow. - CVP only 1 but had elevated left-sided pressures on RHC 2 days ago. - Volume stable on exam. Will hold off on additional IV lasix. Start po lasix 20 mg daily. - Start spiro 12.5 mg daily, will also help with hypokalemia - will add GDMT as BP permits  - not candidate for SGLT2i w/ Type 1DM and DKA  - Now has end-stage HF d/t severe ICM. No options for revascularization. RV reduced on echo but PAPI okay. Starting workup for transplant with option for LVAD as back-up.   2. CAD/NSTEMI  - h/o CABG x 2 2020, LIMA-LAD,  RIMA-LCx - HS trop this admit peaked to 5048  - LHC w/ stable disease, 2/2 patent grafts. No culprit lesions  - suspect trop elevation 2/2 demand ischemia/acute HF  - continue medical management    3. DKA - Type 1 DM, developed after insulin pump malfunctioned  - now resolved - per Internal Medicine    4.  Acute Hypoxic Respiratory Failure - developed post large volume IVF resuscitation  - PCWP 24 and LVEDP 24 on RHC. Diuresed. - improving   5. AKI  - Scr peaked to 1.7, in setting of DKA and low output HF - Resolved    6. Hypokalemia - K 2.7 w/ diuresis  - Resolved   7. Frequent PVCs - supp K, keep > 4.0  - check Mg, keep > 2.0  - Improved with correction of hypokalemia  OOB. Walk today.  Length of Stay: 4  Jennifer Hogan, Jennifer N, PA-C  05/25/2022, 7:59 AM  Advanced Heart Failure Team Pager [phone] (M-F; 7a - 5p)  Please contact [facility] for night-coverage after hours (5p -7a ) and weekends on [email or web address]  Patient seen and examined with the above-signed Advanced Practice Provider and/or Housestaff. I personally reviewed laboratory data, imaging studies and relevant notes. I independently examined the patient and formulated the important aspects of the plan. I have edited the note to reflect any of my changes or salient points. I have personally discussed the plan with the patient and/or family.  Co-ox remains marginal on DBA 2.5. CVP low. Feels somewhat better. No orthopnea or PND  General:  Sitting up in bed  No resp difficulty HEENT: normal Neck: supple. no JVD. Carotids 2+ bilat; no bruits. No lymphadenopathy or thryomegaly appreciated. Cor: PMI laterally displaced. Regular rate & rhythm. No rubs, gallops or murmurs. Lungs: clear Abdomen: soft, nontender, nondistended. No hepatosplenomegaly. No bruits or masses. Good bowel sounds. Extremities: no cyanosis, clubbing, rash, edema Neuro: alert & orientedx3, cranial nerves grossly intact. moves all 4 extremities w/o  difficulty. Affect pleasant  She is improved with inotropic support. Have begun w/u for transplant/VAD. Will attempt to wean DBA tomorrow. Check vascular dopplers to exclude significant PAD.   Arvilla Meres, MD  5:59 PM

## 2022-05-25 NOTE — Progress Notes (Signed)
Lab phlebotomist was on the floor and asked where Jennifer Hogan labs were, "they were sent at 0430 and never received in lab. Labs were redrawn and walked to laboratory, Waiting results.

## 2022-05-25 NOTE — Progress Notes (Signed)
[facility] EDUCATION NOTE:                VAD coordinator met with patient and her potential caregiver Zollie Beckers today to discuss advanced therapies for heart failure.   VAD educational packet including "Understanding Your Options with Advanced Heart Failure", "[place] Patient Agreement for VAD Evaluation and Potential Implantation" consent, and [employer] "Heartmate 3 Left Ventricular Device (LVAD) Patient Guide", Heartmate 3 Left Ventricular Assist System Patient Education Program DVD", "[place] HM III Patient Education", "[facility] Mechanical Circulatory Support Program", and "Decision Aids for Left Ventricular Assist Device" reviewed in detail and left at bedside for continued reference.   All questions answered regarding VAD implant, hospital stay, and what to expect when discharged home living with a heart pump.  Explained need for 24/7 care when pt is discharged home due to sternal precautions, adaptation to living on support, emotional support, consistent and meticulous exit site care and management, medication adherence and high volume of follow up visits with the VAD Clinic after discharge; both pt and caregiver verbalized understanding of above.   We also discussed heart transplant at length today. Pt would prefer to be listed for transplant and have VAD as a back up option.  Pt tells me that her disability was approved on April 19th.   The patient understands that from this discussion it does not mean that they will receive any type of advanced therapy, but that depends on an extensive evaluation process.   All questions have been answered at this time and contact information was provided should they encounter any further questions.  They are both agreeable at this time to the VAD coordinator sending a transplant packet to [facility]. Our team will plan to get the pt d/c and then she can f/u with [facility] on an outpatient basis for transplant evaluation. If the pt is turned down for transplant we can  consent pt for full VAD evaluation here at [facility]   Carlton Adam, RN VAD Coordinator   Office: [phone] 24/7 VAD Pager: [phone]

## 2022-05-25 NOTE — Progress Notes (Signed)
CARDIAC REHAB PHASE I   Stopped by to offer walk. Pt is having lunch, however eager to walk. Pt would like to walk after lunch. Will return to offer walk later today as time allows. Instructed pt to ask 2C staff for assistance walking in hall, if she is ready prior to my return. 2C staff agreeable to assisting when ready. Will continue to follow.   [id number]   Woodroe Chen, RN BSN 05/25/2022 12:01 PM

## 2022-05-26 ENCOUNTER — Other Ambulatory Visit: Payer: Self-pay

## 2022-05-26 ENCOUNTER — Inpatient Hospital Stay (HOSPITAL_COMMUNITY): Payer: Medicare Other

## 2022-05-26 DIAGNOSIS — I5023 Acute on chronic systolic (congestive) heart failure: Secondary | ICD-10-CM | POA: Diagnosis not present

## 2022-05-26 DIAGNOSIS — J9601 Acute respiratory failure with hypoxia: Secondary | ICD-10-CM | POA: Diagnosis not present

## 2022-05-26 DIAGNOSIS — Z01810 Encounter for preprocedural cardiovascular examination: Secondary | ICD-10-CM | POA: Diagnosis not present

## 2022-05-26 DIAGNOSIS — R609 Edema, unspecified: Secondary | ICD-10-CM | POA: Diagnosis not present

## 2022-05-26 DIAGNOSIS — E1010 Type 1 diabetes mellitus with ketoacidosis without coma: Secondary | ICD-10-CM | POA: Diagnosis not present

## 2022-05-26 DIAGNOSIS — I214 Non-ST elevation (NSTEMI) myocardial infarction: Secondary | ICD-10-CM | POA: Diagnosis not present

## 2022-05-26 LAB — BASIC METABOLIC PANEL
Anion gap: 8 (ref 5–15)
BUN: 9 mg/dL (ref 6–20)
CO2: 27 mmol/L (ref 22–32)
Calcium: 8.8 mg/dL — ABNORMAL LOW (ref 8.9–10.3)
Chloride: 103 mmol/L (ref 98–111)
Creatinine, Ser: 0.91 mg/dL (ref 0.44–1.00)
GFR, Estimated: >60 mL/min (ref >60)
Glucose, Bld: 149 mg/dL — ABNORMAL HIGH (ref 70–99)
Potassium: 3.7 mmol/L (ref 3.5–5.1)
Sodium: 138 mmol/L (ref 135–145)

## 2022-05-26 LAB — COOXEMETRY PANEL
Carboxyhemoglobin: 1.4 % (ref 0.5–1.5)
Methemoglobin: <0.7 % (ref 0.0–1.5)
O2 Saturation: 65.1 %
Total hemoglobin: 10.6 g/dL — ABNORMAL LOW (ref 12.0–16.0)

## 2022-05-26 LAB — CULTURE, BLOOD (ROUTINE X 2): Culture: NO GROWTH

## 2022-05-26 LAB — GLUCOSE, CAPILLARY
Glucose-Capillary: 176 mg/dL — ABNORMAL HIGH (ref 70–99)
Glucose-Capillary: 183 mg/dL — ABNORMAL HIGH (ref 70–99)
Glucose-Capillary: 201 mg/dL — ABNORMAL HIGH (ref 70–99)
Glucose-Capillary: 187 mg/dL — ABNORMAL HIGH (ref 70–99)
Glucose-Capillary: 188 mg/dL — ABNORMAL HIGH (ref 70–99)

## 2022-05-26 MED ORDER — POTASSIUM CHLORIDE CRYS ER 20 MEQ PO TBCR
20.0000 meq | EXTENDED_RELEASE_TABLET | Freq: Once | ORAL | Status: AC
  Administered 2022-05-26: 20 meq via ORAL
  Filled 2022-05-26: qty 1

## 2022-05-26 MED ORDER — ONDANSETRON HCL 4 MG PO TABS
4.0000 mg | ORAL_TABLET | Freq: Once | ORAL | Status: AC
  Administered 2022-05-26: 4 mg via ORAL
  Filled 2022-05-26: qty 1

## 2022-05-26 MED ORDER — DOBUTAMINE-DEXTROSE 4-5 MG/ML-% IV SOLN
1.0000 ug/kg/min | INTRAVENOUS | Status: DC
  Administered 2022-05-26: 1 ug/kg/min via INTRAVENOUS

## 2022-05-26 MED ORDER — DIGOXIN 125 MCG PO TABS
0.1250 mg | ORAL_TABLET | Freq: Every day | ORAL | Status: DC
  Administered 2022-05-26 – 2022-06-01 (×7): 0.125 mg via ORAL
  Filled 2022-05-26 (×7): qty 1

## 2022-05-26 NOTE — Progress Notes (Signed)
Pre-VAD vascular exams have been completed.   Results can be found under chart review under CV PROC. 05/26/2022 2:20 PM Calli Bashor RVT, RDMS

## 2022-05-26 NOTE — Inpatient Diabetes Management (Signed)
Inpatient Diabetes Program Recommendations  AACE/ADA: New Consensus Statement on Inpatient Glycemic Control (2015)  Target Ranges:  Prepandial:   less than 140 mg/dL      Peak postprandial:   less than 180 mg/dL (1-2 hours)      Critically ill patients:  140 - 180 mg/dL   Lab Results  Component Value Date   GLUCAP 183 (H) 05/26/2022   HGBA1C 7.8 (H) 05/24/2022    Review of Glycemic Control  Latest Reference Range & Units 05/25/22 07:41 05/25/22 11:03 05/25/22 16:39 05/25/22 18:44 05/25/22 19:21 05/25/22 21:43 05/26/22 03:18 05/26/22 06:21  Glucose-Capillary 70 - 99 mg/dL 161 (H) 096 (H) 045 (H) 54 (L) 107 (H) 164 (H) 176 (H) 183 (H)   Diabetes history: DM 1 Outpatient Diabetes medications:  Omnipod insulin pump Omnipod- basal rate total is 12.4 units/24 hours Unsure of CHO coverage/correction bolus Current orders for Inpatient glycemic control:  Insulin pump order set  Note: hypoglycemia yesterday evening, all other trends WNL. Watch for now on insulin pump.  Inpatient Diabetes Program Recommendations:    Pt just recently placed on Omnipod insulin pump within the last 2 weeks, however, has had a Medtronic insulin pump for years. She is getting use to the new device. Pt reports her levels are not 100 percent perfect yet but she prefers how the Omnipod does not have tubes attached. Pt prefers to use her insulin pump at this time. The insulin pump controls her glucose levels better than taking her off and using a SQ regimen. Will monitor trends for now.  Thanks,  Christena Deem RN, MSN, BC-ADM Inpatient Diabetes Coordinator Team Pager [phone] (8a-5p)

## 2022-05-26 NOTE — Progress Notes (Signed)
Mobility Specialist: Progress Note   05/26/22 1125  Mobility  Activity Ambulated with assistance in hallway  Level of Assistance Standby assist, set-up cues, supervision of patient - no hands on  Assistive Device Other (Comment) (IV pole)  Distance Ambulated (ft) 700 ft  Activity Response Tolerated well  Mobility Referral Yes  $Mobility charge 1 Mobility   Pre-Mobility: 101 HR, 98% SpO2 During Mobility: 147 HR Post-Mobility: 95 HR, 100% SpO2  Pt received in the bed and agreeable to mobility. Mod I with bed mobility and standby during ambulation for line management. Stopped x1 for standing break secondary to mild SOB, otherwise asymptomatic. Pt back to bed after session with call bell and phone in reach.   Johnatha Zeidman Mobility Specialist Please contact via SecureChat or Rehab office at [phone]

## 2022-05-26 NOTE — Progress Notes (Signed)
Pt refused cpap, none in room.

## 2022-05-26 NOTE — Progress Notes (Addendum)
Advanced Heart Failure Rounding Note  PCP-Cardiologist: Armanda Magic, MD   Subjective:   Yesterday started on po lasix and spironolactone.    CO-OX 65% on DBA 2.5  Walked 2 times. Denies SOB.   Objective:   Weight Range: 63.8 kg Body mass index is 25.73 kg/m.   Vital Signs:   Temp:  [97.9 F (36.6 C)-98.6 F (37 C)] 98.1 F (36.7 C) (05/03 0319) Pulse Rate:  [92-109] 99 (05/03 0319) Resp:  [18-20] 18 (05/03 0319) BP: (95-120)/(58-81) 95/63 (05/03 0319) SpO2:  [96 %-100 %] 96 % (05/03 0319) Weight:  [63.8 kg] 63.8 kg (05/03 0625) Last BM Date : 05/25/22  Weight change: Filed Weights   05/24/22 0559 05/25/22 0500 05/26/22 0625  Weight: 64.7 kg 64.8 kg 63.8 kg    Intake/Output:   Intake/Output Summary (Last 24 hours) at 05/26/2022 0729 Last data filed at 05/25/2022 1807 Gross per 24 hour  Intake 558.63 ml  Output --  Net 558.63 ml    CVP 5-6   Physical Exam   General:  Well appearing. No resp difficulty HEENT: normal Neck: supple. no JVD. Carotids 2+ bilat; no bruits. No lymphadenopathy or thryomegaly appreciated. LIJ Cor: PMI nondisplaced. Regular rate & rhythm. No rubs, or murmurs. +S3  Lungs: clear  Abdomen: soft, nontender, nondistended. No hepatosplenomegaly. No bruits or masses. Good bowel sounds. Extremities: no cyanosis, clubbing, rash, edema Neuro: alert & orientedx3, cranial nerves grossly intact. moves all 4 extremities w/o difficulty. Affect pleasant  Telemetry   SR-ST 90-100s   Labs    CBC Recent Labs    05/23/22 1136 05/24/22 0527  WBC  --  6.0  HGB 10.9* 9.7*  HCT 32.0* 28.6*  MCV  --  89.4  PLT  --  304   Basic Metabolic Panel Recent Labs    29/56/21 1218 05/25/22 0615 05/26/22 0410  NA  --  138 138  K 3.3* 4.4 3.7  CL  --  100 103  CO2  --  27 27  GLUCOSE  --  166* 149*  BUN  --  8 9  CREATININE  --  0.96 0.91  CALCIUM  --  8.6* 8.8*  MG 1.9  --   --    Liver Function Tests No results for input(s): "AST", "ALT",  "ALKPHOS", "BILITOT", "PROT", "ALBUMIN" in the last 72 hours. No results for input(s): "LIPASE", "AMYLASE" in the last 72 hours. Cardiac Enzymes No results for input(s): "CKTOTAL", "CKMB", "CKMBINDEX", "TROPONINI" in the last 72 hours.  BNP: BNP (last 3 results) Recent Labs    11/03/21 1151 05/22/22 1529  BNP 127.9* >4,500.0*    ProBNP (last 3 results) No results for input(s): "PROBNP" in the last 8760 hours.   D-Dimer No results for input(s): "DDIMER" in the last 72 hours. Hemoglobin A1C Recent Labs    05/24/22 1218  HGBA1C 7.8*   Fasting Lipid Panel No results for input(s): "CHOL", "HDL", "LDLCALC", "TRIG", "CHOLHDL", "LDLDIRECT" in the last 72 hours. Thyroid Function Tests No results for input(s): "TSH", "T4TOTAL", "T3FREE", "THYROIDAB" in the last 72 hours.  Invalid input(s): "FREET3"  Other results:   Imaging    No results found.   Medications:     Scheduled Medications:  aspirin EC  81 mg Oral QHS   buPROPion  150 mg Oral BH-q7a   Chlorhexidine Gluconate Cloth  6 each Topical Daily   clopidogrel  75 mg Oral QHS   DULoxetine  30 mg Oral QHS   furosemide  20 mg  Oral Daily   heparin  5,000 Units Subcutaneous Q8H   insulin pump   Subcutaneous TID WC, HS, 0200   levothyroxine  100 mcg Oral Q0600   mometasone-formoterol  2 puff Inhalation BID   montelukast  10 mg Oral QHS   pantoprazole  40 mg Oral BID   polyethylene glycol  17 g Oral Daily   senna-docusate  1 tablet Oral BID   sodium chloride flush  3 mL Intravenous Q12H   spironolactone  12.5 mg Oral Daily   traZODone  100 mg Oral QHS    Infusions:  sodium chloride 10 mL/hr at 05/25/22 1106   sodium chloride     cefTRIAXone (ROCEPHIN)  IV Stopped (05/25/22 1106)   DOBUTamine 2.5 mcg/kg/min (05/25/22 1807)   promethazine (PHENERGAN) injection (IM or IVPB) Stopped (05/22/22 0946)    PRN Medications: sodium chloride, sodium chloride, acetaminophen, dextrose, diclofenac Sodium, diphenhydrAMINE,  fluticasone, levalbuterol, mouth rinse, polyethylene glycol, promethazine (PHENERGAN) injection (IM or IVPB), sodium chloride flush    Patient Profile   52 y.o.with HFrEF, CAD, s/p CABG in 10/2018 (LIMA to the LAD/ramus intermedius and RIMA to the obtuse marginal), DM Type I,  anemia, anxiety, hypothyroidism, and hyperlipidemia. Admitted w/ DKA. Hospitalization c/b a/c systolic heart failure w/ low output.   Assessment/Plan    1. 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  - Echo 09/2020 EF 20-25%.  - LHC with 09/2020 with severe coronary disease. Total occlusion of the proximal LAD, moderate stenosis of the intermediate branch, and severe ostial circumflex in-stent restenosis.  - Echo 12/27/20  EF 40-45% RV mildly HK - Echo 12/23 EF 45-50%  - Echo this admit EF down 20-25% w/ AK of anteroseptal wall and apex, RV severely reduced. Also w/ Hs trop elevation peaking at 5,048. LHC w/ stable coronary anatomy and 2/2 patent bypass grafts. Suspect drop in EF likely stress induced in setting of critical illness/DKA - RHC c/w low output, LV dominant shock (mRA 4, PAP 44/18, mPCWP 24, LVEDP 24, FICK CO 3.04, CI 1.85, Co-ox 46%) - now on DBA 2.5. Co-ox 65%. Wean to 1 mcg. Add digoxin 0.125 mg daily  - No BB with shock.   - Volume stable on exam. Continue lasix 20 mg daily  - Continue spiro 12.5 mg daily. Would not increase with soft BP.  - No room for ARB/ARNi with soft BP.  - not candidate for SGLT2i w/ Type 1DM and DKA  - Now has end-stage HF d/t severe ICM. No options for revascularization. RV reduced on echo but PAPI okay. Starting workup for transplant with option for LVAD as back-up.   2. CAD/NSTEMI  - h/o CABG x 2 2020, LIMA-LAD, RIMA-LCx - HS trop this admit peaked to 5048  - LHC w/ stable disease, 2/2 patent grafts. No culprit lesions  - suspect trop elevation 2/2 demand ischemia/acute HF  - No chest pain.  - continue medical  management    3. DKA - Type 1 DM, developed after insulin pump malfunctioned  - Hgb A1C 10.6  - now resolved - per Internal Medicine    4. Acute Hypoxic Respiratory Failure - developed after  volume IVF resuscitation  - PCWP 24 and LVEDP 24 on RHC. Diuresed. - Resolved on room air.    5. AKI  - Scr peaked to 1.7, in setting of DKA and low output HF - Resolved    6. Hypokalemia - K 3.7 . Replace K  7. Frequent PVCs - supp K, keep > 4.0  -  keep > 2.0   Ambulate.   Length of Stay: 5  Amy Clegg, NP  05/26/2022, 7:30 AM  Advanced Heart Failure Team Pager [phone] (M-F; 7a - 5p)  Please contact [facility] for night-coverage after hours (5p -7a ) and weekends on [email or web address]   Patient seen and examined with the above-signed Advanced Practice Provider and/or Housestaff. I personally reviewed laboratory data, imaging studies and relevant notes. I independently examined the patient and formulated the important aspects of the plan. I have edited the note to reflect any of my changes or salient points. I have personally discussed the plan with the patient and/or family.  Co-ox improved on DBA. Volume status looks good. Walked hall 2x.   General:  Well appearing. No resp difficulty HEENT: normal Neck: supple. no JVD. Carotids 2+ bilat; no bruits. No lymphadenopathy or thryomegaly appreciated. Cor: PMI laterally displaced. Regular rate & rhythm. No rubs, gallops or murmurs. Lungs: clear Abdomen: soft, nontender, nondistended. No hepatosplenomegaly. No bruits or masses. Good bowel sounds. Extremities: no cyanosis, clubbing, rash, edema Neuro: alert & orientedx3, cranial nerves grossly intact. moves all 4 extremities w/o difficulty. Affect pleasant  Will stop DBA. Agree with adding digoxin.   Hopefully can get her home tomorrow or Sunday with outpatient f/u with [facility] for transplant eval.   Arvilla Meres, MD  9:58 AM

## 2022-05-26 NOTE — Progress Notes (Signed)
Packet sent to [facility] for heart transplant referral/consideration. I have asked the transplant assistant to expedite this referral for our team and to let us know when she is scheduled for evaluation.  Carlton Adam RN, BSN VAD Coordinator 24/7 Pager [phone]

## 2022-05-26 NOTE — Progress Notes (Signed)
CARDIAC REHAB PHASE I   PRE:  Rate/Rhythm: 117 ST   BP:  Sitting: 116/70      SaO2: 99 RA  MODE:  Ambulation: 340 ft   POST:  Rate/Rhythm: 145 ST  BP:  Sitting: 128/86      SaO2: 98 RA   Pt resting in bed feeling well today. Ambulated in hall, moving at slow steady pace. Tolerated well with no CP, dizziness and mild SOB towards end of walk. Returned to bed with call bell and bedside table in reach. HF education including HF booklet, low sodium heart healthy diet, restrictions, exercise guidelines and CRP2 reviewed. All questions and concerns addressed. Pt would like to participate in CRP2 program. She wants to talk with MD today regarding timing and when she will be ready to begin. Will hold referral for now. Will continue to follow.  [id number]  Woodroe Chen, RN BSN 05/26/2022 9:34 AM

## 2022-05-26 NOTE — Plan of Care (Signed)

## 2022-05-26 NOTE — Progress Notes (Signed)
[facility]                                                                              Jennifer Hogan, is a 52 y.o. female, DOB - 05-Dec-1970, WUJ:[id number] Admit date - 05/20/2022    Outpatient Primary MD for the patient is [name], NP  LOS - 5  days  Chief Complaint  Patient presents with   Emesis       Brief summary   Patient is a 52 year old female with diabetes mellitus type 1, on insulin pump, CAD status post CABG presented with nausea and vomiting and severe hyperglycemia.  Improved with aggressive hydration and then became short of breath and tachycardiac, was placed on 10 L O2, concern for pulmonary edema.  Patient was transferred to ICU and CCM was consulted. Significant Hospital Events:   4/28 admitted with nausea/vomiting and severe hyperglycemia in the setting of DKA due to question insulin pump failure.  Early morning patient seen with new significant oxygen requirement from room air to 10L with associated tachycardia and posterior crackles concerning for volume overload/pulmonary edema 4/29 Remains on 10L, trop to [facility] 4/30 O2 requirement down-trending, plan for cardiac cath-> severe double vessel CAD.  Recommended continue medical management, diuresis.  5/1: Transferred to floor, TRH assumed care   Assessment & Plan    Principal Problem:   Acute hypoxic respiratory failure (HCC) likely due to pulmonary edema, acute on chronic systolic CHF -On 9/56, patient had a significant new O2 requirement from room air to 10 L, + troponins, plateaued at 5048, BNP>4500 -Improving, ambulating without any difficulty, no hypoxia, O2 sats 96-99 point percent on room air   Active Problems:   NSTEMI (non-ST elevated myocardial infarction) (HCC),  Acute on chronic systolic heart failure (HCC), CAD -History of three-vessel CAD with prior CABG 10/2018 -2D echo showed EF of 20 to 25% -Cardiac cath 05/23/2022 showed severe triple-vessel CAD, recommended  continue medical management with diuresis. -Patient was placed on aspirin, Plavix, IV heparin (now off) -CHF team following, on low-dose dobutamine drip, weaned to 1 mcg, transitioned to oral Lasix 20 mg daily, spironolactone 12.5 mg daily.   -Cardiology following closely  -End-stage CHF, due to severe ICM, per cardiology no options for revascularization, starting workup for transplant with option for LVAD as backup -Venous Doppler for VAD w/u workup negative for any DVT  Hypokalemia -Replace as needed  Diabetes mellitus type 1 with DKA, uncontrolled -Initially treated with IV insulin drip and aggressive IV fluid hydration -Hemoglobin A1c 7.8.  It was 8.0 on 3/1.   CBG (last 3)  Recent Labs    05/26/22 0318 05/26/22 0621 05/26/22 1136  GLUCAP 176* 183* 201*  -Diabetic coordinator consulted, continue insulin pump   Acute kidney injury -Creatinine 1.67 on admission, likely due to dehydration and DKA -Resolved, creatinine 0.9  Normocytic anemia -MCV 89.4, baseline 10-13 -Close to baseline, closely follow.  Estimated body mass index is 25.73 kg/m as calculated from the following:   Height as of this encounter: 5\' 2"  (1.575 m).   Weight as of this encounter: 63.8 kg.  Code Status: Full code DVT Prophylaxis:  heparin injection 5,000 Units Start: 05/23/22 2200   Level of Care: Level of care: Progressive Family Communication: Updated patient's family member in the room Disposition Plan:      Remains inpatient appropriate:   Hopefully DC in 24 to 48 hours if cleared by cardiology.   Procedures:  Cardiac cath 2D echo  Consultants:   CCM cardiology  Antimicrobials:   Anti-infectives (From admission, onward)    Start     Dose/Rate Route Frequency Ordered Stop   05/24/22 1000  cefTRIAXone (ROCEPHIN) 2 g in sodium chloride 0.9 % 100 mL IVPB        2 g 200 mL/hr over 30 Minutes Intravenous Every 24 hours 05/23/22 1012 05/27/22 2359   05/21/22 0915  cefTRIAXone (ROCEPHIN)  2 g in sodium chloride 0.9 % 100 mL IVPB  Status:  Discontinued        2 g 200 mL/hr over 30 Minutes Intravenous Every 24 hours 05/21/22 0827 05/23/22 1010          Medications  aspirin EC  81 mg Oral QHS   buPROPion  150 mg Oral BH-q7a   Chlorhexidine Gluconate Cloth  6 each Topical Daily   clopidogrel  75 mg Oral QHS   digoxin  0.125 mg Oral Daily   DULoxetine  30 mg Oral QHS   furosemide  20 mg Oral Daily   heparin  5,000 Units Subcutaneous Q8H   insulin pump   Subcutaneous TID WC, HS, 0200   levothyroxine  100 mcg Oral Q0600   mometasone-formoterol  2 puff Inhalation BID   montelukast  10 mg Oral QHS   pantoprazole  40 mg Oral BID   polyethylene glycol  17 g Oral Daily   senna-docusate  1 tablet Oral BID   sodium chloride flush  3 mL Intravenous Q12H   spironolactone  12.5 mg Oral Daily   traZODone  100 mg Oral QHS      Subjective:   Jennifer Hogan was seen and examined today.  Ambulating in the hallway, no hypoxia, chest pain.  Feeling better.  No acute complaints.  Objective:   Vitals:   05/26/22 0319 05/26/22 0625 05/26/22 0800 05/26/22 1139  BP: 95/63  116/70 125/85  Pulse: 99  (!) 103 100  Resp: 18  18 18   Temp: 98.1 F (36.7 C)  98.2 F (36.8 C) 98.5 F (36.9 C)  TempSrc: Oral  Oral Oral  SpO2: 96%  99% 99%  Weight:  63.8 kg    Height:        Intake/Output Summary (Last 24 hours) at 05/26/2022 1451 Last data filed at 05/26/2022 0701 Gross per 24 hour  Intake 348.73 ml  Output --  Net 348.73 ml     Wt Readings from Last 3 Encounters:  05/26/22 63.8 kg  05/17/22 64.3 kg  05/03/22 65.6 kg    Physical Exam General: Alert and oriented x 3, NAD Cardiovascular: S1 S2 clear, RRR.  Respiratory: CTAB Gastrointestinal: Soft, nontender, nondistended, NBS Ext: no pedal edema bilaterally Neuro: no new deficits Skin: No rashes Psych: Normal affect, pleasant   Data Reviewed:  I have personally reviewed following labs    CBC Lab Results   Component Value Date   WBC 6.0 05/24/2022   RBC 3.20 (L) 05/24/2022   HGB 9.7 (L) 05/24/2022   HCT 28.6 (L) 05/24/2022   MCV 89.4 05/24/2022   MCH 30.3 05/24/2022   PLT 304 05/24/2022   MCHC 33.9 05/24/2022   RDW 14.3 05/24/2022  LYMPHSABS 0.5 (L) 05/20/2022   MONOABS 0.6 05/20/2022   EOSABS 0.0 05/20/2022   BASOSABS 0.0 05/20/2022     Last metabolic panel Lab Results  Component Value Date   NA 138 05/26/2022   K 3.7 05/26/2022   CL 103 05/26/2022   CO2 27 05/26/2022   BUN 9 05/26/2022   CREATININE 0.91 05/26/2022   GLUCOSE 149 (H) 05/26/2022   GFRNONAA >60 05/26/2022   GFRAA >60 07/14/2019   CALCIUM 8.8 (L) 05/26/2022   PHOS 4.0 05/22/2022   PROT 8.0 05/20/2022   ALBUMIN 4.5 05/20/2022   LABGLOB 2.9 02/15/2022   AGRATIO 1.6 02/15/2022   BILITOT 0.7 05/20/2022   ALKPHOS 84 05/20/2022   AST 28 05/20/2022   ALT 19 05/20/2022   ANIONGAP 8 05/26/2022    CBG (last 3)  Recent Labs    05/26/22 0318 05/26/22 0621 05/26/22 1136  GLUCAP 176* 183* 201*      Coagulation Profile: No results for input(s): "INR", "PROTIME" in the last 168 hours.   Radiology Studies: I have personally reviewed the imaging studies  VAS US DOPPLER PRE VAD  Result Date: 05/26/2022 PERIOPERATIVE VASCULAR EVALUATION Patient Name:  TARIKA FREEBURN  Date of Exam:   05/26/2022 Medical Rec #: [id number]          Accession #:    [id number] Date of Birth: 1970-09-02         Patient Gender: F Patient Age:   8 years Exam Location:  [facility] Procedure:      VAS US DOPPLER PRE VAD Referring Phys: Lillia Abed [facility] --------------------------------------------------------------------------------  Indications:      Pre-VAD. Risk Factors:     Hyperlipidemia, Diabetes, past history of smoking, prior MI,                   coronary artery disease. Other Factors:    CHF, S/P CABG x 3. Comparison Study: Previous exam (Pre-CABG) on 10/31/18 WNL Performing Technologist: Ernestene Mention RVT, RDMS  Examination  Guidelines: A complete evaluation includes B-mode imaging, spectral Doppler, color Doppler, and power Doppler as needed of all accessible portions of each vessel. Bilateral testing is considered an integral part of a complete examination. Limited examinations for reoccurring indications may be performed as noted.  Right Carotid Findings: +----------+--------+--------+--------+---------------------+------------------+           PSV cm/sEDV cm/sStenosisDescribe             Comments           +----------+--------+--------+--------+---------------------+------------------+ CCA Prox  100     24                                   intimal thickening +----------+--------+--------+--------+---------------------+------------------+ CCA Distal77      24                                   intimal thickening +----------+--------+--------+--------+---------------------+------------------+ ICA Prox  80      29              hypoechoic and  heterogenous                            +----------+--------+--------+--------+---------------------+------------------+ ICA Mid   114     50                                                      +----------+--------+--------+--------+---------------------+------------------+ ICA Distal114     45                                                      +----------+--------+--------+--------+---------------------+------------------+ ECA       95      11                                                      +----------+--------+--------+--------+---------------------+------------------+ +----------+--------+-------+----------------+------------+           PSV cm/sEDV cmsDescribe        Arm Pressure +----------+--------+-------+----------------+------------+ Subclavian97             Multiphasic, [id number]          +----------+--------+-------+----------------+------------+  +---------+--------+--+--------+--+---------+ VertebralPSV cm/s49EDV cm/s18Antegrade +---------+--------+--+--------+--+---------+ Left Carotid Findings: +----------+--------+--------+--------+--------+------------------+           PSV cm/sEDV cm/sStenosisDescribeComments           +----------+--------+--------+--------+--------+------------------+ CCA Prox  106     29                                         +----------+--------+--------+--------+--------+------------------+ CCA Distal71      19                                         +----------+--------+--------+--------+--------+------------------+ ICA Prox  62      19                      intimal thickening +----------+--------+--------+--------+--------+------------------+ ICA Distal113     50                                         +----------+--------+--------+--------+--------+------------------+ ECA       71      9                                          +----------+--------+--------+--------+--------+------------------+ +----------+--------+--------+----------------+------------+ SubclavianPSV cm/sEDV cm/sDescribe        Arm Pressure +----------+--------+--------+----------------+------------+           135             Multiphasic, [id number]          +----------+--------+--------+----------------+------------+ +---------+--------+--+--------+--+---------+ VertebralPSV cm/s54EDV cm/s14Antegrade +---------+--------+--+--------+--+---------+  ABI Findings: +---------+------------------+-----+---------+--------+  Right    Rt Pressure (mmHg)IndexWaveform Comment  +---------+------------------+-----+---------+--------+ Brachial 107                    triphasic         +---------+------------------+-----+---------+--------+ PTA      158               1.45 triphasic         +---------+------------------+-----+---------+--------+ DP       111               1.02 triphasic          +---------+------------------+-----+---------+--------+ Great Toe71                     Normal            +---------+------------------+-----+---------+--------+ +---------+------------------+-----+---------+-------+ Left     Lt Pressure (mmHg)IndexWaveform Comment +---------+------------------+-----+---------+-------+ Brachial 109                    triphasic        +---------+------------------+-----+---------+-------+ PTA      130               1.19 triphasic        +---------+------------------+-----+---------+-------+ DP       114               1.05 triphasic        +---------+------------------+-----+---------+-------+ Demaris Callander                     Normal           +---------+------------------+-----+---------+-------+ +-------+---------------+----------------+ ABI/TBIToday's ABI/TBIPrevious ABI/TBI +-------+---------------+----------------+ Right  1.45 / 0.65                     +-------+---------------+----------------+ Left   1.19 / 0.72                     +-------+---------------+----------------+  Summary: Right Carotid: The extracranial vessels were near-normal with only minimal wall                thickening or plaque. Left Carotid: The extracranial vessels were near-normal with only minimal wall               thickening or plaque. Vertebrals:  Bilateral vertebral arteries demonstrate antegrade flow. Subclavians: Normal flow hemodynamics were seen in bilateral subclavian              arteries.  *See table(s) above for measurements and observations. Right ABI: Resting right ankle-brachial index indicates noncompressible right lower extremity arteries. The right toe-brachial index is mildly abnormal. Left ABI: Resting left ankle-brachial index is within normal range. The left toe-brachial index is normal.     Preliminary    VAS [name] LOWER EXTREMITY VENOUS (DVT)  Result Date: 05/26/2022  Lower Venous DVT Study Patient Name:  SRUTI DONAT  Date of  Exam:   05/26/2022 Medical Rec #: [id number]          Accession #:    [id number] Date of Birth: 04-01-1970         Patient Gender: F Patient Age:   45 years Exam Location:  [facility] Procedure:      VAS [name] LOWER EXTREMITY VENOUS (DVT) Referring Phys: Liel Rudden --------------------------------------------------------------------------------  Indications: Pre-VAD.  Comparison Study: No previous exams Performing Technologist: Jody Hill RVT, RDMS  Examination Guidelines: A complete evaluation includes B-mode imaging, spectral Doppler, color Doppler, and  power Doppler as needed of all accessible portions of each vessel. Bilateral testing is considered an integral part of a complete examination. Limited examinations for reoccurring indications may be performed as noted. The reflux portion of the exam is performed with the patient in reverse Trendelenburg.  +---------+---------------+---------+-----------+----------+--------------+ RIGHT    CompressibilityPhasicitySpontaneityPropertiesThrombus Aging +---------+---------------+---------+-----------+----------+--------------+ CFV      Full           Yes      Yes                                 +---------+---------------+---------+-----------+----------+--------------+ SFJ      Full                                                        +---------+---------------+---------+-----------+----------+--------------+ FV Prox  Full           Yes      Yes                                 +---------+---------------+---------+-----------+----------+--------------+ FV Mid   Full           Yes      Yes                                 +---------+---------------+---------+-----------+----------+--------------+ FV DistalFull           Yes      Yes                                 +---------+---------------+---------+-----------+----------+--------------+ PFV      Full                                                         +---------+---------------+---------+-----------+----------+--------------+ POP      Full           Yes      Yes                                 +---------+---------------+---------+-----------+----------+--------------+ PTV      Full                                                        +---------+---------------+---------+-----------+----------+--------------+ PERO     Full                                                        +---------+---------------+---------+-----------+----------+--------------+   +---------+---------------+---------+-----------+-------------+--------------+ LEFT     CompressibilityPhasicitySpontaneityProperties   Thrombus Aging +---------+---------------+---------+-----------+-------------+--------------+ CFV  Full           Yes      Yes                                    +---------+---------------+---------+-----------+-------------+--------------+ SFJ      Full                                                           +---------+---------------+---------+-----------+-------------+--------------+ FV Prox  Full           Yes      Yes                                    +---------+---------------+---------+-----------+-------------+--------------+ FV Mid   Full           Yes      Yes                                    +---------+---------------+---------+-----------+-------------+--------------+ FV DistalFull           Yes      Yes                                    +---------+---------------+---------+-----------+-------------+--------------+ PFV      Full                                                           +---------+---------------+---------+-----------+-------------+--------------+ POP      Full           Yes      Yes        rouleaux flow               +---------+---------------+---------+-----------+-------------+--------------+ PTV      Full                                                            +---------+---------------+---------+-----------+-------------+--------------+ PERO     Full                                                           +---------+---------------+---------+-----------+-------------+--------------+    Summary: BILATERAL: - No evidence of deep vein thrombosis seen in the lower extremities, bilaterally. -No evidence of popliteal cyst, bilaterally.   *See table(s) above for measurements and observations.    Preliminary        Thad Ranger M.D. [facility] 05/26/2022, 2:51 PM  Available via Epic secure chat 7am-7pm After 7 pm, please refer  to night coverage provider listed on amion.

## 2022-05-27 ENCOUNTER — Inpatient Hospital Stay (HOSPITAL_COMMUNITY): Payer: Medicare Other

## 2022-05-27 DIAGNOSIS — I214 Non-ST elevation (NSTEMI) myocardial infarction: Secondary | ICD-10-CM | POA: Diagnosis not present

## 2022-05-27 DIAGNOSIS — E1010 Type 1 diabetes mellitus with ketoacidosis without coma: Secondary | ICD-10-CM | POA: Diagnosis not present

## 2022-05-27 DIAGNOSIS — J9601 Acute respiratory failure with hypoxia: Secondary | ICD-10-CM | POA: Diagnosis not present

## 2022-05-27 DIAGNOSIS — I5021 Acute systolic (congestive) heart failure: Secondary | ICD-10-CM | POA: Diagnosis not present

## 2022-05-27 LAB — VAS US DOPPLER PRE VAD
Left ABI: 1.19
Right ABI: 1.45

## 2022-05-27 LAB — GLUCOSE, CAPILLARY
Glucose-Capillary: 123 mg/dL — ABNORMAL HIGH (ref 70–99)
Glucose-Capillary: 148 mg/dL — ABNORMAL HIGH (ref 70–99)
Glucose-Capillary: 235 mg/dL — ABNORMAL HIGH (ref 70–99)
Glucose-Capillary: 204 mg/dL — ABNORMAL HIGH (ref 70–99)
Glucose-Capillary: 183 mg/dL — ABNORMAL HIGH (ref 70–99)

## 2022-05-27 LAB — BASIC METABOLIC PANEL
Anion gap: 15 (ref 5–15)
BUN: 12 mg/dL (ref 6–20)
CO2: 25 mmol/L (ref 22–32)
Calcium: 8.9 mg/dL (ref 8.9–10.3)
Chloride: 96 mmol/L — ABNORMAL LOW (ref 98–111)
Creatinine, Ser: 0.96 mg/dL (ref 0.44–1.00)
GFR, Estimated: >60 mL/min (ref >60)
Glucose, Bld: 264 mg/dL — ABNORMAL HIGH (ref 70–99)
Potassium: 4.3 mmol/L (ref 3.5–5.1)
Sodium: 136 mmol/L (ref 135–145)

## 2022-05-27 LAB — COOXEMETRY PANEL
Carboxyhemoglobin: 1.7 % — ABNORMAL HIGH (ref 0.5–1.5)
Methemoglobin: 0.9 % (ref 0.0–1.5)
O2 Saturation: 68.3 %
Total hemoglobin: 11.5 g/dL — ABNORMAL LOW (ref 12.0–16.0)

## 2022-05-27 MED ORDER — ONDANSETRON HCL 4 MG/2ML IJ SOLN
4.0000 mg | Freq: Four times a day (QID) | INTRAMUSCULAR | Status: DC | PRN
  Administered 2022-05-27 – 2022-05-29 (×2): 4 mg via INTRAVENOUS
  Filled 2022-05-27 (×2): qty 2

## 2022-05-27 MED ORDER — LOSARTAN POTASSIUM 25 MG PO TABS
12.5000 mg | ORAL_TABLET | Freq: Every day | ORAL | Status: DC
  Filled 2022-05-27: qty 1

## 2022-05-27 MED ORDER — LORAZEPAM 2 MG/ML IJ SOLN
1.0000 mg | Freq: Once | INTRAMUSCULAR | Status: AC
  Administered 2022-05-27: 1 mg via INTRAVENOUS

## 2022-05-27 MED ORDER — METOCLOPRAMIDE HCL 5 MG/ML IJ SOLN
10.0000 mg | Freq: Four times a day (QID) | INTRAMUSCULAR | Status: DC
  Administered 2022-05-27 – 2022-06-01 (×19): 10 mg via INTRAVENOUS
  Filled 2022-05-27 (×20): qty 2

## 2022-05-27 MED ORDER — SODIUM CHLORIDE 0.9 % IV SOLN
12.5000 mg | Freq: Four times a day (QID) | INTRAVENOUS | Status: DC | PRN
  Administered 2022-05-27: 12.5 mg via INTRAVENOUS
  Filled 2022-05-27: qty 0.5

## 2022-05-27 MED ORDER — LORAZEPAM 2 MG/ML IJ SOLN
INTRAMUSCULAR | Status: AC
  Filled 2022-05-27: qty 1

## 2022-05-27 NOTE — Progress Notes (Signed)
[name], female   DOB: [birth year], 52 y.o.   MRN: [id number]     Advanced Heart Failure Rounding Note  PCP-Cardiologist: Armanda Magic, MD   Subjective:   Yesterday started on po lasix and spironolactone.    CO-OX 65% on DBA 2.5  Walked 2 times. Denies SOB.   Objective:   Weight Range: 63.8 kg Body mass index is 25.73 kg/m.   Vital Signs:   Temp:  [98.1 F (36.7 C)-98.5 F (36.9 C)] 98.1 F (36.7 C) (05/04 0740) Pulse Rate:  [82-102] 102 (05/04 0740) Resp:  [15-18] 17 (05/04 0740) BP: (86-125)/(54-85) 115/76 (05/04 0740) SpO2:  [92 %-100 %] 97 % (05/04 0808) Last BM Date : 05/25/22  Weight change: Filed Weights   05/24/22 0559 05/25/22 0500 05/26/22 0625  Weight: 64.7 kg 64.8 kg 63.8 kg    Intake/Output:   Intake/Output Summary (Last 24 hours) at 05/27/2022 0946 Last data filed at 05/27/2022 0600 Gross per 24 hour  Intake 1065.29 ml  Output --  Net 1065.29 ml    CVP 5-6   Physical Exam   General:  Well appearing. No resp difficulty HEENT: normal Neck: supple. no JVD. Carotids 2+ bilat; no bruits. No lymphadenopathy or thryomegaly appreciated. LIJ Cor: PMI nondisplaced. Regular rate & rhythm. No rubs, or murmurs. +S3  Lungs: clear  Abdomen: soft, nontender, nondistended. No hepatosplenomegaly. No bruits or masses. Good bowel sounds. Extremities: no cyanosis, clubbing, rash, edema Neuro: alert & orientedx3, cranial nerves grossly intact. moves all 4 extremities w/o difficulty. Affect pleasant  Telemetry   SR-ST 90-100s   Labs    CBC No results for input(s): "WBC", "NEUTROABS", "HGB", "HCT", "MCV", "PLT" in the last 72 hours.  Basic Metabolic Panel Recent Labs    91/47/82 1218 05/25/22 0615 05/26/22 0410  NA  --  138 138  K 3.3* 4.4 3.7  CL  --  100 103  CO2  --  27 27  GLUCOSE  --  166* 149*  BUN  --  8 9  CREATININE  --  0.96 0.91  CALCIUM  --  8.6* 8.8*  MG 1.9  --   --    Liver Function Tests No results for input(s):  "AST", "ALT", "ALKPHOS", "BILITOT", "PROT", "ALBUMIN" in the last 72 hours. No results for input(s): "LIPASE", "AMYLASE" in the last 72 hours. Cardiac Enzymes No results for input(s): "CKTOTAL", "CKMB", "CKMBINDEX", "TROPONINI" in the last 72 hours.  BNP: BNP (last 3 results) Recent Labs    11/03/21 1151 05/22/22 1529  BNP 127.9* >4,500.0*    ProBNP (last 3 results) No results for input(s): "PROBNP" in the last 8760 hours.   D-Dimer No results for input(s): "DDIMER" in the last 72 hours. Hemoglobin A1C Recent Labs    05/24/22 1218  HGBA1C 7.8*   Fasting Lipid Panel No results for input(s): "CHOL", "HDL", "LDLCALC", "TRIG", "CHOLHDL", "LDLDIRECT" in the last 72 hours. Thyroid Function Tests No results for input(s): "TSH", "T4TOTAL", "T3FREE", "THYROIDAB" in the last 72 hours.  Invalid input(s): "FREET3"  Other results:   Imaging    VAS US DOPPLER PRE VAD  Result Date: 05/26/2022 PERIOPERATIVE VASCULAR EVALUATION Patient Name:  Jennifer Hogan  Date of Exam:   05/26/2022 Medical Rec #: [id number]          Accession #:    [id number] Date of Birth: [birth year]         Patient Gender: F Patient Age:   31 years Exam Location:  [facility]  Procedure:      VAS US DOPPLER PRE VAD Referring Phys: [facility] --------------------------------------------------------------------------------  Indications:      Pre-VAD. Risk Factors:     Hyperlipidemia, Diabetes, past history of smoking, prior MI,                   coronary artery disease. Other Factors:    CHF, S/P CABG x 3. Comparison Study: Previous exam (Pre-CABG) on 10/31/18 WNL Performing Technologist: Ernestene Mention RVT, RDMS  Examination Guidelines: A complete evaluation includes B-mode imaging, spectral Doppler, color Doppler, and power Doppler as needed of all accessible portions of each vessel. Bilateral testing is considered an integral part of a complete examination. Limited examinations for reoccurring indications may be  performed as noted.  Right Carotid Findings: +----------+--------+--------+--------+---------------------+------------------+           PSV cm/sEDV cm/sStenosisDescribe             Comments           +----------+--------+--------+--------+---------------------+------------------+ CCA Prox  100     24                                   intimal thickening +----------+--------+--------+--------+---------------------+------------------+ CCA Distal77      24                                   intimal thickening +----------+--------+--------+--------+---------------------+------------------+ ICA Prox  80      29              hypoechoic and                                                            heterogenous                            +----------+--------+--------+--------+---------------------+------------------+ ICA Mid   114     50                                                      +----------+--------+--------+--------+---------------------+------------------+ ICA Distal114     45                                                      +----------+--------+--------+--------+---------------------+------------------+ ECA       95      11                                                      +----------+--------+--------+--------+---------------------+------------------+ +----------+--------+-------+----------------+------------+           PSV cm/sEDV cmsDescribe        Arm Pressure +----------+--------+-------+----------------+------------+ [id number]  Multiphasic, [id number]          +----------+--------+-------+----------------+------------+ +---------+--------+--+--------+--+---------+ VertebralPSV cm/s49EDV cm/s18Antegrade +---------+--------+--+--------+--+---------+ Left Carotid Findings: +----------+--------+--------+--------+--------+------------------+           PSV cm/sEDV cm/sStenosisDescribeComments            +----------+--------+--------+--------+--------+------------------+ CCA Prox  106     29                                         +----------+--------+--------+--------+--------+------------------+ CCA Distal71      19                                         +----------+--------+--------+--------+--------+------------------+ ICA Prox  62      19                      intimal thickening +----------+--------+--------+--------+--------+------------------+ ICA Distal113     50                                         +----------+--------+--------+--------+--------+------------------+ ECA       71      9                                          +----------+--------+--------+--------+--------+------------------+ +----------+--------+--------+----------------+------------+ SubclavianPSV cm/sEDV cm/sDescribe        Arm Pressure +----------+--------+--------+----------------+------------+           135             Multiphasic, [id number]          +----------+--------+--------+----------------+------------+ +---------+--------+--+--------+--+---------+ VertebralPSV cm/s54EDV cm/s14Antegrade +---------+--------+--+--------+--+---------+  ABI Findings: +---------+------------------+-----+---------+--------+ Right    Rt Pressure (mmHg)IndexWaveform Comment  +---------+------------------+-----+---------+--------+ Brachial 107                    triphasic         +---------+------------------+-----+---------+--------+ PTA      158               1.45 triphasic         +---------+------------------+-----+---------+--------+ DP       111               1.02 triphasic         +---------+------------------+-----+---------+--------+ Great Toe71                     Normal            +---------+------------------+-----+---------+--------+ +---------+------------------+-----+---------+-------+ Left     Lt Pressure (mmHg)IndexWaveform Comment  +---------+------------------+-----+---------+-------+ Brachial 109                    triphasic        +---------+------------------+-----+---------+-------+ PTA      130               1.19 triphasic        +---------+------------------+-----+---------+-------+ DP       114               1.05 triphasic        +---------+------------------+-----+---------+-------+ Demaris Callander  Normal           +---------+------------------+-----+---------+-------+ +-------+---------------+----------------+ ABI/TBIToday's ABI/TBIPrevious ABI/TBI +-------+---------------+----------------+ Right  1.45 / 0.65                     +-------+---------------+----------------+ Left   1.19 / 0.72                     +-------+---------------+----------------+  Summary: Right Carotid: The extracranial vessels were near-normal with only minimal wall                thickening or plaque. Left Carotid: The extracranial vessels were near-normal with only minimal wall               thickening or plaque. Vertebrals:  Bilateral vertebral arteries demonstrate antegrade flow. Subclavians: Normal flow hemodynamics were seen in bilateral subclavian              arteries.  *See table(s) above for measurements and observations. Right ABI: Resting right ankle-brachial index indicates noncompressible right lower extremity arteries. The right toe-brachial index is mildly abnormal. Left ABI: Resting left ankle-brachial index is within normal range. The left toe-brachial index is normal.     Preliminary    VAS [name] LOWER EXTREMITY VENOUS (DVT)  Result Date: 05/26/2022  Lower Venous DVT Study Patient Name:  Jennifer Hogan  Date of Exam:   05/26/2022 Medical Rec #: [id number]          Accession #:    [id number] Date of Birth: [birth year]         Patient Gender: F Patient Age:   55 years Exam Location:  [facility] Procedure:      VAS [name] LOWER EXTREMITY VENOUS (DVT) Referring Phys: RIPUDEEP RAI  --------------------------------------------------------------------------------  Indications: Pre-VAD.  Comparison Study: No previous exams Performing Technologist: Jody Hill RVT, RDMS  Examination Guidelines: A complete evaluation includes B-mode imaging, spectral Doppler, color Doppler, and power Doppler as needed of all accessible portions of each vessel. Bilateral testing is considered an integral part of a complete examination. Limited examinations for reoccurring indications may be performed as noted. The reflux portion of the exam is performed with the patient in reverse Trendelenburg.  +---------+---------------+---------+-----------+----------+--------------+ RIGHT    CompressibilityPhasicitySpontaneityPropertiesThrombus Aging +---------+---------------+---------+-----------+----------+--------------+ CFV      Full           Yes      Yes                                 +---------+---------------+---------+-----------+----------+--------------+ SFJ      Full                                                        +---------+---------------+---------+-----------+----------+--------------+ FV Prox  Full           Yes      Yes                                 +---------+---------------+---------+-----------+----------+--------------+ FV Mid   Full           Yes      Yes                                 +---------+---------------+---------+-----------+----------+--------------+  FV DistalFull           Yes      Yes                                 +---------+---------------+---------+-----------+----------+--------------+ PFV      Full                                                        +---------+---------------+---------+-----------+----------+--------------+ POP      Full           Yes      Yes                                 +---------+---------------+---------+-----------+----------+--------------+ PTV      Full                                                         +---------+---------------+---------+-----------+----------+--------------+ PERO     Full                                                        +---------+---------------+---------+-----------+----------+--------------+   +---------+---------------+---------+-----------+-------------+--------------+ LEFT     CompressibilityPhasicitySpontaneityProperties   Thrombus Aging +---------+---------------+---------+-----------+-------------+--------------+ CFV      Full           Yes      Yes                                    +---------+---------------+---------+-----------+-------------+--------------+ SFJ      Full                                                           +---------+---------------+---------+-----------+-------------+--------------+ FV Prox  Full           Yes      Yes                                    +---------+---------------+---------+-----------+-------------+--------------+ FV Mid   Full           Yes      Yes                                    +---------+---------------+---------+-----------+-------------+--------------+ FV DistalFull           Yes      Yes                                    +---------+---------------+---------+-----------+-------------+--------------+  PFV      Full                                                           +---------+---------------+---------+-----------+-------------+--------------+ POP      Full           Yes      Yes        rouleaux flow               +---------+---------------+---------+-----------+-------------+--------------+ PTV      Full                                                           +---------+---------------+---------+-----------+-------------+--------------+ PERO     Full                                                           +---------+---------------+---------+-----------+-------------+--------------+    Summary: BILATERAL: - No evidence of  deep vein thrombosis seen in the lower extremities, bilaterally. -No evidence of popliteal cyst, bilaterally.   *See table(s) above for measurements and observations.    Preliminary      Medications:     Scheduled Medications:  aspirin EC  81 mg Oral QHS   buPROPion  150 mg Oral BH-q7a   Chlorhexidine Gluconate Cloth  6 each Topical Daily   clopidogrel  75 mg Oral QHS   digoxin  0.125 mg Oral Daily   DULoxetine  30 mg Oral QHS   furosemide  20 mg Oral Daily   heparin  5,000 Units Subcutaneous Q8H   insulin pump   Subcutaneous TID WC, HS, 0200   levothyroxine  100 mcg Oral Q0600   losartan  12.5 mg Oral Daily   mometasone-formoterol  2 puff Inhalation BID   montelukast  10 mg Oral QHS   pantoprazole  40 mg Oral BID   polyethylene glycol  17 g Oral Daily   senna-docusate  1 tablet Oral BID   sodium chloride flush  3 mL Intravenous Q12H   spironolactone  12.5 mg Oral Daily   traZODone  100 mg Oral QHS    Infusions:  cefTRIAXone (ROCEPHIN)  IV Stopped (05/26/22 1008)    PRN Medications: acetaminophen, dextrose, diclofenac Sodium, diphenhydrAMINE, fluticasone, levalbuterol, mouth rinse, polyethylene glycol, sodium chloride flush    Patient Profile   52 y.o.with HFrEF, CAD, s/p CABG in 10/2018 (LIMA to the LAD/ramus intermedius and RIMA to the obtuse marginal), DM Type I,  anemia, anxiety, hypothyroidism, and hyperlipidemia. Admitted w/ DKA. Hospitalization c/b a/c systolic heart failure w/ low output.   Assessment/Plan    1. 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  - Echo 09/2020 EF 20-25%.  - LHC with 09/2020 with severe coronary disease. Total occlusion of the proximal LAD, moderate stenosis of the intermediate branch, and severe ostial circumflex in-stent restenosis.  - Echo 12/27/20  EF 40-45% RV mildly HK -  Echo 12/23 EF 45-50%  - Echo this admit EF down 20-25% w/ AK of anteroseptal wall and apex, RV severely  reduced. Also w/ Hs trop elevation peaking at 5,048. LHC w/ stable coronary anatomy and 2/2 patent bypass grafts. Suspect drop in EF likely stress induced in setting of critical illness/DKA - RHC c/w low output, LV dominant shock (mRA 4, PAP 44/18, mPCWP 24, LVEDP 24, FICK CO 3.04, CI 1.85, Co-ox 46%) - Dobutamine stopped 5/3, co-ox 68% today.  No BMET yet.  Feels a little worse than yesterday but "not much difference."  - Continue digoxin 0.125 daily.  - CVP 3-4, continue po Lasix 20 daily.  - No BB with shock.  - Continue spiro 12.5 mg daily.  - SBP 100s-110s, will add losartan 12.5 daily. - not candidate for SGLT2i w/ Type 1DM and DKA  - Now has end-stage HF d/t severe ICM. No options for revascularization. RV reduced on echo but PAPI okay. Starting workup for transplant with option for LVAD as back-up.  Will need blood glucose well-controlled, hgbA1c this admission was 7.8.   - Watch today on po meds, possibly home tomorrow.    2. CAD/NSTEMI  - h/o CABG x 2 2020, LIMA-LAD, RIMA-LCx - HS trop this admit peaked to 5048  - LHC w/ stable disease, 2/2 patent grafts. No culprit lesions  - suspect trop elevation 2/2 demand ischemia/acute HF  - No chest pain.  - continue medical management    3. DKA - Type 1 DM, developed after insulin pump malfunctioned  - Hgb A1C 10.6 => 7.8 on 5/1.  - now resolved - per Internal Medicine    4. Acute Hypoxic Respiratory Failure - developed after  volume IVF resuscitation  - PCWP 24 and LVEDP 24 on RHC. Diuresed. - Resolved on room air.    5. AKI  - Scr peaked to 1.7, in setting of DKA and low output HF - Resolved, pending BMET today.    6. Hypokalemia - Replace K as needed.    7. Frequent PVCs - supp K, keep > 4.0  -  keep > 2.0   Ambulate.   Length of Stay: 6  Marca Ancona, MD  05/27/2022, 9:46 AM  Advanced Heart Failure Team Pager [phone] (M-F; 7a - 5p)  Please contact [facility] for night-coverage after hours (5p -7a ) and  weekends on [email or web address]

## 2022-05-27 NOTE — Progress Notes (Addendum)
Patient unable to take oral medication this evening due to persistent vomiting. Patient sticking finger in her mouth to evoke vomiting. Phenergran IVPB given.

## 2022-05-27 NOTE — Final Progress Note (Signed)
Pt resting on room air with no distress. Bipap not needed at this time.

## 2022-05-27 NOTE — Progress Notes (Signed)
CARDIAC REHAB PHASE I   PRE:  Rate/Rhythm: 92 SR  BP:  Supine: 119/82 Sitting:   Standing:    SaO2: 98% RA  MODE:  Ambulation: 740 ft   POST:  Rate/Rhythm: 150 ST  BP:  Supine: 138/85 Sitting:   Standing:    SaO2: 97% RA  [id number] Patient ambulated around the unit and outside in the hall approximately 740 ft. Tolerated well until the end of the walk, when she became extremely nauseated. Ambulated back to bed, heart rate elevated, blood pressure within normal limits. Patient vomited into emesis basin. I alerted patient's nurse of symptoms for further care.  Artist Pais, MS, ACSM CEP

## 2022-05-27 NOTE — Progress Notes (Signed)
[facility]                                                                              Jennifer Hogan, is a 52 y.o. female, DOB - 10/15/1970, ION:[id number] Admit date - 05/20/2022    Outpatient Primary MD for the patient is [name], NP  LOS - 6  days  Chief Complaint  Patient presents with   Emesis       Brief summary   Patient is a 52 year old female with diabetes mellitus type 1, on insulin pump, CAD status post CABG presented with nausea and vomiting and severe hyperglycemia.  Improved with aggressive hydration and then became short of breath and tachycardiac, was placed on 10 L O2, concern for pulmonary edema.  Patient was transferred to ICU and CCM was consulted. Significant Hospital Events:   4/28 admitted with nausea/vomiting and severe hyperglycemia in the setting of DKA due to question insulin pump failure.  Early morning patient seen with new significant oxygen requirement from room air to 10L with associated tachycardia and posterior crackles concerning for volume overload/pulmonary edema 4/29 Remains on 10L, trop to [facility] 4/30 O2 requirement down-trending, plan for cardiac cath-> severe double vessel CAD.  Recommended continue medical management, diuresis.  5/1: Transferred to floor, TRH assumed care   Assessment & Plan    Principal Problem:   Acute hypoxic respiratory failure (HCC) likely due to pulmonary edema, acute on chronic systolic CHF -On 2/44, patient had a significant new O2 requirement from room air to 10 L, + troponins, plateaued at 5048, BNP>4500 -Improving, on room air   Active Problems:   NSTEMI (non-ST elevated myocardial infarction) (HCC),  Acute on chronic systolic heart failure (HCC), CAD -History of three-vessel CAD with prior CABG 10/2018 -2D echo showed EF of 20 to 25% -Cardiac cath 05/23/2022 showed severe triple-vessel CAD, recommended continue medical management with diuresis. -Patient was placed on aspirin,  Plavix, IV heparin (now off) -CHF team following, on low-dose dobutamine drip, weaned to 1 mcg, transitioned to oral Lasix 20 mg daily, spironolactone 12.5 mg daily.   -Cardiology following closely  -End-stage CHF, due to severe ICM, per cardiology no options for revascularization, starting workup for transplant with option for LVAD as backup -Venous Doppler for VAD w/u workup negative for any DVT  Intractable nausea and vomiting -Reporting nauseous since stopping dobutamine yesterday.  Today having vomiting. -No significant help with the Zofran, placed on IV Reglan scheduled, continue Phenergan. -Obtain KUB, continue PPI  Hypokalemia -Replace as needed  Diabetes mellitus type 1 with DKA, uncontrolled -Initially treated with IV insulin drip and aggressive IV fluid hydration -Hemoglobin A1c 7.8.  It was 8.0 on 3/1.   CBG (last 3)  Recent Labs    05/27/22 0342 05/27/22 0646 05/27/22 1112  GLUCAP 123* 148* 235*  -Diabetic coordinator consulted, continue insulin pump   Acute kidney injury -Creatinine 1.67 on admission, likely due to dehydration and DKA -Resolved, creatinine 0.9  Normocytic anemia -MCV 89.4, baseline 10-13 -Close to baseline, closely follow.  Estimated body mass index is 25.73 kg/m as calculated from the following:   Height as of this encounter:  5\' 2"  (1.575 m).   Weight as of this encounter: 63.8 kg.  Code Status: Full code DVT Prophylaxis:  heparin injection 5,000 Units Start: 05/23/22 2200   Level of Care: Level of care: Progressive Family Communication: Updated patient's family member in the room Disposition Plan:      Remains inpatient appropriate:   Possible DC tomorrow if nausea vomiting improved.  Procedures:  Cardiac cath 2D echo  Consultants:   CCM cardiology  Antimicrobials:   Anti-infectives (From admission, onward)    Start     Dose/Rate Route Frequency Ordered Stop   05/24/22 1000  cefTRIAXone (ROCEPHIN) 2 g in sodium chloride 0.9  % 100 mL IVPB        2 g 200 mL/hr over 30 Minutes Intravenous Every 24 hours 05/23/22 1012 05/27/22 1118   05/21/22 0915  cefTRIAXone (ROCEPHIN) 2 g in sodium chloride 0.9 % 100 mL IVPB  Status:  Discontinued        2 g 200 mL/hr over 30 Minutes Intravenous Every 24 hours 05/21/22 0827 05/23/22 1010          Medications  aspirin EC  81 mg Oral QHS   buPROPion  150 mg Oral BH-q7a   Chlorhexidine Gluconate Cloth  6 each Topical Daily   clopidogrel  75 mg Oral QHS   digoxin  0.125 mg Oral Daily   DULoxetine  30 mg Oral QHS   furosemide  20 mg Oral Daily   heparin  5,000 Units Subcutaneous Q8H   insulin pump   Subcutaneous TID WC, HS, 0200   levothyroxine  100 mcg Oral Q0600   losartan  12.5 mg Oral Daily   metoCLOPramide (REGLAN) injection  10 mg Intravenous Q6H   mometasone-formoterol  2 puff Inhalation BID   montelukast  10 mg Oral QHS   pantoprazole  40 mg Oral BID   polyethylene glycol  17 g Oral Daily   senna-docusate  1 tablet Oral BID   sodium chloride flush  3 mL Intravenous Q12H   spironolactone  12.5 mg Oral Daily   traZODone  100 mg Oral QHS      Subjective:   Jennifer Hogan was seen and examined today.  States has been feeling nauseous since yesterday however today had vomiting episode.  No fevers or chills, no abdominal pain, chest pain or acute shortness of breath.  Ambulating.  Dobutamine drip off.  Objective:   Vitals:   05/27/22 0600 05/27/22 0740 05/27/22 0808 05/27/22 1100  BP:  115/76  138/85  Pulse: 94 (!) 102  (!) 123  Resp: 15 17    Temp:  98.1 F (36.7 C)  98.4 F (36.9 C)  TempSrc:  Oral  Oral  SpO2: 95% 95% 97% 97%  Weight:      Height:        Intake/Output Summary (Last 24 hours) at 05/27/2022 1225 Last data filed at 05/27/2022 0600 Gross per 24 hour  Intake 1065.29 ml  Output --  Net 1065.29 ml     Wt Readings from Last 3 Encounters:  05/26/22 63.8 kg  05/17/22 64.3 kg  05/03/22 65.6 kg   Physical Exam General: Alert and  oriented x 3, NAD Cardiovascular: S1 S2 clear, RRR.  Respiratory: CTAB Gastrointestinal: Soft, nontender, nondistended, NBS Ext: no pedal edema bilaterally Neuro: no new deficits Psych: Normal affect    Data Reviewed:  I have personally reviewed following labs    CBC Lab Results  Component Value Date   WBC 6.0 05/24/2022  RBC 3.20 (L) 05/24/2022   HGB 9.7 (L) 05/24/2022   HCT 28.6 (L) 05/24/2022   MCV 89.4 05/24/2022   MCH 30.3 05/24/2022   PLT 304 05/24/2022   MCHC 33.9 05/24/2022   RDW 14.3 05/24/2022   LYMPHSABS 0.5 (L) 05/20/2022   MONOABS 0.6 05/20/2022   EOSABS 0.0 05/20/2022   BASOSABS 0.0 05/20/2022     Last metabolic panel Lab Results  Component Value Date   NA 136 05/27/2022   K 4.3 05/27/2022   CL 96 (L) 05/27/2022   CO2 25 05/27/2022   BUN 12 05/27/2022   CREATININE 0.96 05/27/2022   GLUCOSE 264 (H) 05/27/2022   GFRNONAA >60 05/27/2022   GFRAA >60 07/14/2019   CALCIUM 8.9 05/27/2022   PHOS 4.0 05/22/2022   PROT 8.0 05/20/2022   ALBUMIN 4.5 05/20/2022   LABGLOB 2.9 02/15/2022   AGRATIO 1.6 02/15/2022   BILITOT 0.7 05/20/2022   ALKPHOS 84 05/20/2022   AST 28 05/20/2022   ALT 19 05/20/2022   ANIONGAP 15 05/27/2022    CBG (last 3)  Recent Labs    05/27/22 0342 05/27/22 0646 05/27/22 1112  GLUCAP 123* 148* 235*      Coagulation Profile: No results for input(s): "INR", "PROTIME" in the last 168 hours.   Radiology Studies: I have personally reviewed the imaging studies  VAS US DOPPLER PRE VAD  Result Date: 05/26/2022 PERIOPERATIVE VASCULAR EVALUATION Patient Name:  NAYDENE LAGRANGE  Date of Exam:   05/26/2022 Medical Rec #: [id number]          Accession #:    [id number] Date of Birth: [birth year]         Patient Gender: F Patient Age:   11 years Exam Location:  [facility] Procedure:      VAS US DOPPLER PRE VAD Referring Phys: Lillia Abed [facility] --------------------------------------------------------------------------------  Indications:       Pre-VAD. Risk Factors:     Hyperlipidemia, Diabetes, past history of smoking, prior MI,                   coronary artery disease. Other Factors:    CHF, S/P CABG x 3. Comparison Study: Previous exam (Pre-CABG) on 10/31/18 WNL Performing Technologist: Ernestene Mention RVT, RDMS  Examination Guidelines: A complete evaluation includes B-mode imaging, spectral Doppler, color Doppler, and power Doppler as needed of all accessible portions of each vessel. Bilateral testing is considered an integral part of a complete examination. Limited examinations for reoccurring indications may be performed as noted.  Right Carotid Findings: +----------+--------+--------+--------+---------------------+------------------+           PSV cm/sEDV cm/sStenosisDescribe             Comments           +----------+--------+--------+--------+---------------------+------------------+ CCA Prox  100     24                                   intimal thickening +----------+--------+--------+--------+---------------------+------------------+ CCA Distal77      24                                   intimal thickening +----------+--------+--------+--------+---------------------+------------------+ ICA Prox  80      29              hypoechoic and  heterogenous                            +----------+--------+--------+--------+---------------------+------------------+ ICA Mid   114     50                                                      +----------+--------+--------+--------+---------------------+------------------+ ICA Distal114     45                                                      +----------+--------+--------+--------+---------------------+------------------+ ECA       95      11                                                      +----------+--------+--------+--------+---------------------+------------------+  +----------+--------+-------+----------------+------------+           PSV cm/sEDV cmsDescribe        Arm Pressure +----------+--------+-------+----------------+------------+ Subclavian97             Multiphasic, [id number]          +----------+--------+-------+----------------+------------+ +---------+--------+--+--------+--+---------+ VertebralPSV cm/s49EDV cm/s18Antegrade +---------+--------+--+--------+--+---------+ Left Carotid Findings: +----------+--------+--------+--------+--------+------------------+           PSV cm/sEDV cm/sStenosisDescribeComments           +----------+--------+--------+--------+--------+------------------+ CCA Prox  106     29                                         +----------+--------+--------+--------+--------+------------------+ CCA Distal71      19                                         +----------+--------+--------+--------+--------+------------------+ ICA Prox  62      19                      intimal thickening +----------+--------+--------+--------+--------+------------------+ ICA Distal113     50                                         +----------+--------+--------+--------+--------+------------------+ ECA       71      9                                          +----------+--------+--------+--------+--------+------------------+ +----------+--------+--------+----------------+------------+ SubclavianPSV cm/sEDV cm/sDescribe        Arm Pressure +----------+--------+--------+----------------+------------+           135             Multiphasic, [id number]          +----------+--------+--------+----------------+------------+ +---------+--------+--+--------+--+---------+ VertebralPSV cm/s54EDV cm/s14Antegrade +---------+--------+--+--------+--+---------+  ABI Findings: +---------+------------------+-----+---------+--------+  Right    Rt Pressure (mmHg)IndexWaveform Comment   +---------+------------------+-----+---------+--------+ Brachial 107                    triphasic         +---------+------------------+-----+---------+--------+ PTA      158               1.45 triphasic         +---------+------------------+-----+---------+--------+ DP       111               1.02 triphasic         +---------+------------------+-----+---------+--------+ Great Toe71                     Normal            +---------+------------------+-----+---------+--------+ +---------+------------------+-----+---------+-------+ Left     Lt Pressure (mmHg)IndexWaveform Comment +---------+------------------+-----+---------+-------+ Brachial 109                    triphasic        +---------+------------------+-----+---------+-------+ PTA      130               1.19 triphasic        +---------+------------------+-----+---------+-------+ DP       114               1.05 triphasic        +---------+------------------+-----+---------+-------+ Demaris Callander                     Normal           +---------+------------------+-----+---------+-------+ +-------+---------------+----------------+ ABI/TBIToday's ABI/TBIPrevious ABI/TBI +-------+---------------+----------------+ Right  1.45 / 0.65                     +-------+---------------+----------------+ Left   1.19 / 0.72                     +-------+---------------+----------------+  Summary: Right Carotid: The extracranial vessels were near-normal with only minimal wall                thickening or plaque. Left Carotid: The extracranial vessels were near-normal with only minimal wall               thickening or plaque. Vertebrals:  Bilateral vertebral arteries demonstrate antegrade flow. Subclavians: Normal flow hemodynamics were seen in bilateral subclavian              arteries.  *See table(s) above for measurements and observations. Right ABI: Resting right ankle-brachial index indicates noncompressible  right lower extremity arteries. The right toe-brachial index is mildly abnormal. Left ABI: Resting left ankle-brachial index is within normal range. The left toe-brachial index is normal.     Preliminary    VAS [name] LOWER EXTREMITY VENOUS (DVT)  Result Date: 05/26/2022  Lower Venous DVT Study Patient Name:  CHERISA LITTLEPAGE  Date of Exam:   05/26/2022 Medical Rec #: [id number]          Accession #:    [id number] Date of Birth: [birth year]         Patient Gender: F Patient Age:   79 years Exam Location:  [facility] Procedure:      VAS [name] LOWER EXTREMITY VENOUS (DVT) Referring Phys: Linet Brash --------------------------------------------------------------------------------  Indications: Pre-VAD.  Comparison Study: No previous exams Performing Technologist: Jody Hill RVT, RDMS  Examination Guidelines: A complete evaluation includes B-mode imaging, spectral Doppler, color Doppler, and  power Doppler as needed of all accessible portions of each vessel. Bilateral testing is considered an integral part of a complete examination. Limited examinations for reoccurring indications may be performed as noted. The reflux portion of the exam is performed with the patient in reverse Trendelenburg.  +---------+---------------+---------+-----------+----------+--------------+ RIGHT    CompressibilityPhasicitySpontaneityPropertiesThrombus Aging +---------+---------------+---------+-----------+----------+--------------+ CFV      Full           Yes      Yes                                 +---------+---------------+---------+-----------+----------+--------------+ SFJ      Full                                                        +---------+---------------+---------+-----------+----------+--------------+ FV Prox  Full           Yes      Yes                                 +---------+---------------+---------+-----------+----------+--------------+ FV Mid   Full           Yes      Yes                                  +---------+---------------+---------+-----------+----------+--------------+ FV DistalFull           Yes      Yes                                 +---------+---------------+---------+-----------+----------+--------------+ PFV      Full                                                        +---------+---------------+---------+-----------+----------+--------------+ POP      Full           Yes      Yes                                 +---------+---------------+---------+-----------+----------+--------------+ PTV      Full                                                        +---------+---------------+---------+-----------+----------+--------------+ PERO     Full                                                        +---------+---------------+---------+-----------+----------+--------------+   +---------+---------------+---------+-----------+-------------+--------------+ LEFT     CompressibilityPhasicitySpontaneityProperties   Thrombus Aging +---------+---------------+---------+-----------+-------------+--------------+ CFV  Full           Yes      Yes                                    +---------+---------------+---------+-----------+-------------+--------------+ SFJ      Full                                                           +---------+---------------+---------+-----------+-------------+--------------+ FV Prox  Full           Yes      Yes                                    +---------+---------------+---------+-----------+-------------+--------------+ FV Mid   Full           Yes      Yes                                    +---------+---------------+---------+-----------+-------------+--------------+ FV DistalFull           Yes      Yes                                    +---------+---------------+---------+-----------+-------------+--------------+ PFV      Full                                                            +---------+---------------+---------+-----------+-------------+--------------+ POP      Full           Yes      Yes        rouleaux flow               +---------+---------------+---------+-----------+-------------+--------------+ PTV      Full                                                           +---------+---------------+---------+-----------+-------------+--------------+ PERO     Full                                                           +---------+---------------+---------+-----------+-------------+--------------+    Summary: BILATERAL: - No evidence of deep vein thrombosis seen in the lower extremities, bilaterally. -No evidence of popliteal cyst, bilaterally.   *See table(s) above for measurements and observations.    Preliminary        Thad Ranger M.D. [facility] 05/27/2022, 12:25 PM  Available via Epic secure chat 7am-7pm After 7 pm, please refer  to night coverage provider listed on amion.

## 2022-05-28 DIAGNOSIS — E1010 Type 1 diabetes mellitus with ketoacidosis without coma: Secondary | ICD-10-CM | POA: Diagnosis not present

## 2022-05-28 DIAGNOSIS — I214 Non-ST elevation (NSTEMI) myocardial infarction: Secondary | ICD-10-CM | POA: Diagnosis not present

## 2022-05-28 DIAGNOSIS — J9601 Acute respiratory failure with hypoxia: Secondary | ICD-10-CM | POA: Diagnosis not present

## 2022-05-28 DIAGNOSIS — R570 Cardiogenic shock: Secondary | ICD-10-CM

## 2022-05-28 DIAGNOSIS — I5023 Acute on chronic systolic (congestive) heart failure: Secondary | ICD-10-CM | POA: Diagnosis not present

## 2022-05-28 LAB — CBC
HCT: 34 % — ABNORMAL LOW (ref 36.0–46.0)
Hemoglobin: 11.6 g/dL — ABNORMAL LOW (ref 12.0–15.0)
MCH: 30.4 pg (ref 26.0–34.0)
MCHC: 34.1 g/dL (ref 30.0–36.0)
MCV: 89.2 fL (ref 80.0–100.0)
Platelets: 416 10*3/uL — ABNORMAL HIGH (ref 150–400)
RBC: 3.81 MIL/uL — ABNORMAL LOW (ref 3.87–5.11)
RDW: 14.6 % (ref 11.5–15.5)
WBC: 10.7 10*3/uL — ABNORMAL HIGH (ref 4.0–10.5)
nRBC: 0 % (ref 0.0–0.2)

## 2022-05-28 LAB — BASIC METABOLIC PANEL
Anion gap: 17 — ABNORMAL HIGH (ref 5–15)
BUN: 18 mg/dL (ref 6–20)
CO2: 20 mmol/L — ABNORMAL LOW (ref 22–32)
Calcium: 9.2 mg/dL (ref 8.9–10.3)
Chloride: 97 mmol/L — ABNORMAL LOW (ref 98–111)
Creatinine, Ser: 0.99 mg/dL (ref 0.44–1.00)
GFR, Estimated: >60 mL/min (ref >60)
Glucose, Bld: 248 mg/dL — ABNORMAL HIGH (ref 70–99)
Potassium: 4.1 mmol/L (ref 3.5–5.1)
Sodium: 134 mmol/L — ABNORMAL LOW (ref 135–145)

## 2022-05-28 LAB — GLUCOSE, CAPILLARY
Glucose-Capillary: 237 mg/dL — ABNORMAL HIGH (ref 70–99)
Glucose-Capillary: 246 mg/dL — ABNORMAL HIGH (ref 70–99)
Glucose-Capillary: 209 mg/dL — ABNORMAL HIGH (ref 70–99)
Glucose-Capillary: 207 mg/dL — ABNORMAL HIGH (ref 70–99)
Glucose-Capillary: 151 mg/dL — ABNORMAL HIGH (ref 70–99)

## 2022-05-28 LAB — COOXEMETRY PANEL
Carboxyhemoglobin: 1.2 % (ref 0.5–1.5)
Methemoglobin: <0.7 % (ref 0.0–1.5)
O2 Saturation: 62.1 %
Total hemoglobin: 11.7 g/dL — ABNORMAL LOW (ref 12.0–16.0)

## 2022-05-28 MED ORDER — LOSARTAN POTASSIUM 25 MG PO TABS: 12.5000 mg | ORAL_TABLET | Freq: Every day | ORAL | Status: DC

## 2022-05-28 MED ORDER — DOBUTAMINE-DEXTROSE 4-5 MG/ML-% IV SOLN
3.0000 ug/kg/min | INTRAVENOUS | Status: DC
  Administered 2022-05-28 – 2022-05-31 (×2): 3 ug/kg/min via INTRAVENOUS
  Filled 2022-05-28 (×2): qty 250

## 2022-05-28 MED ORDER — SODIUM CHLORIDE 0.9 % IV SOLN: 25.0000 mg | Freq: Four times a day (QID) | INTRAVENOUS | Status: DC | PRN

## 2022-05-28 NOTE — Progress Notes (Signed)
[name], female   DOB: 1970-10-13, 52 y.o.   MRN: [id number]     Advanced Heart Failure Rounding Note  PCP-Cardiologist: Armanda Magic, MD   Subjective:    Dobutamine stopped yesterday, co-ox 62% this morning with stable creatinine at 0.99.  CVP 3.    She feels much worse off dobutamine.  She is tachycardic to 120 and nauseated.    Objective:   Weight Range: 63.8 kg Body mass index is 25.73 kg/m.   Vital Signs:   Temp:  [98.1 F (36.7 C)-99 F (37.2 C)] 98.2 F (36.8 C) (05/05 1052) Pulse Rate:  [105-200] 115 (05/05 0705) Resp:  [16-18] 18 (05/05 0705) BP: (93-164)/(57-101) 93/57 (05/05 0705) SpO2:  [95 %-100 %] 97 % (05/05 0918) Last BM Date : 05/25/22  Weight change: Filed Weights   05/24/22 0559 05/25/22 0500 05/26/22 0625  Weight: 64.7 kg 64.8 kg 63.8 kg    Intake/Output:   Intake/Output Summary (Last 24 hours) at 05/28/2022 1136 Last data filed at 05/28/2022 0300 Gross per 24 hour  Intake 293 ml  Output 1000 ml  Net -707 ml    CVP 3   Physical Exam   General: NAD Neck: No JVD, no thyromegaly or thyroid nodule.  Lungs: Clear to auscultation bilaterally with normal respiratory effort. CV: Lateral PMI.  Heart tachy, regular S1/S2, no S3/S4, no murmur.  No peripheral edema.   Abdomen: Soft, nontender, no hepatosplenomegaly, no distention.  Skin: Intact without lesions or rashes.  Neurologic: Alert and oriented x 3.  Psych: Normal affect. Extremities: No clubbing or cyanosis.  HEENT: Normal.    Telemetry   ST 110s-120 (personally reviewed)   Labs    CBC Recent Labs    05/28/22 0315  WBC 10.7*  HGB 11.6*  HCT 34.0*  MCV 89.2  PLT 416*    Basic Metabolic Panel Recent Labs    42/59/56 0942 05/28/22 0315  NA 136 134*  K 4.3 4.1  CL 96* 97*  CO2 25 20*  GLUCOSE 264* 248*  BUN 12 18  CREATININE 0.96 0.99  CALCIUM 8.9 9.2   Liver Function Tests No results for input(s): "AST", "ALT", "ALKPHOS", "BILITOT", "PROT",  "ALBUMIN" in the last 72 hours. No results for input(s): "LIPASE", "AMYLASE" in the last 72 hours. Cardiac Enzymes No results for input(s): "CKTOTAL", "CKMB", "CKMBINDEX", "TROPONINI" in the last 72 hours.  BNP: BNP (last 3 results) Recent Labs    11/03/21 1151 05/22/22 1529  BNP 127.9* >4,500.0*    ProBNP (last 3 results) No results for input(s): "PROBNP" in the last 8760 hours.   D-Dimer No results for input(s): "DDIMER" in the last 72 hours. Hemoglobin A1C No results for input(s): "HGBA1C" in the last 72 hours.  Fasting Lipid Panel No results for input(s): "CHOL", "HDL", "LDLCALC", "TRIG", "CHOLHDL", "LDLDIRECT" in the last 72 hours. Thyroid Function Tests No results for input(s): "TSH", "T4TOTAL", "T3FREE", "THYROIDAB" in the last 72 hours.  Invalid input(s): "FREET3"  Other results:   Imaging    DG Abd Portable 2V  Result Date: 05/27/2022 CLINICAL DATA:  Nausea vomiting. EXAM: PORTABLE ABDOMEN - 2 VIEW COMPARISON:  CT abdomen pelvis, 05/20/2022. Chest radiograph, 05/22/2022. FINDINGS: No bowel dilation to suggest obstruction or significant adynamic ileus. Relative paucity of bowel gas. No evidence of renal or ureteral stones. Leg chronic device overlies the right mid abdomen. Soft tissues otherwise unremarkable. Most of the chest was visualized. Previous median sternotomy. Cardiac silhouette normal in size. Clear lungs. Opacities noted on  the prior chest radiograph have resolved. No acute skeletal abnormality. IMPRESSION: 1. No acute findings.  No evidence of bowel obstruction. Electronically Signed   By: Amie Portland M.D.   On: 05/27/2022 13:11     Medications:     Scheduled Medications:  aspirin EC  81 mg Oral QHS   buPROPion  150 mg Oral BH-q7a   Chlorhexidine Gluconate Cloth  6 each Topical Daily   clopidogrel  75 mg Oral QHS   digoxin  0.125 mg Oral Daily   DULoxetine  30 mg Oral QHS   furosemide  20 mg Oral Daily   heparin  5,000 Units Subcutaneous Q8H    insulin pump   Subcutaneous TID WC, HS, 0200   levothyroxine  100 mcg Oral Q0600   metoCLOPramide (REGLAN) injection  10 mg Intravenous Q6H   mometasone-formoterol  2 puff Inhalation BID   montelukast  10 mg Oral QHS   pantoprazole  40 mg Oral BID   polyethylene glycol  17 g Oral Daily   senna-docusate  1 tablet Oral BID   sodium chloride flush  3 mL Intravenous Q12H   spironolactone  12.5 mg Oral Daily   traZODone  100 mg Oral QHS    Infusions:  DOBUTamine     promethazine (PHENERGAN) injection (IM or IVPB)      PRN Medications: acetaminophen, dextrose, diclofenac Sodium, diphenhydrAMINE, fluticasone, levalbuterol, ondansetron (ZOFRAN) IV, mouth rinse, polyethylene glycol, promethazine (PHENERGAN) injection (IM or IVPB), sodium chloride flush    Patient Profile   52 y.o.with HFrEF, CAD, s/p CABG in 10/2018 (LIMA to the LAD/ramus intermedius and RIMA to the obtuse marginal), DM Type I,  anemia, anxiety, hypothyroidism, and hyperlipidemia. Admitted w/ DKA. Hospitalization c/b a/c systolic heart failure w/ low output.   Assessment/Plan    1. 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  - Echo 09/2020 EF 20-25%.  - LHC with 09/2020 with severe coronary disease. Total occlusion of the proximal LAD, moderate stenosis of the intermediate branch, and severe ostial circumflex in-stent restenosis.  - Echo 12/27/20  EF 40-45% RV mildly HK - Echo 12/23 EF 45-50%  - Echo this admit EF down 20-25% w/ AK of anteroseptal wall and apex, RV severely reduced. Also w/ Hs trop elevation peaking at 5,048. LHC w/ stable coronary anatomy and 2/2 patent bypass grafts. Suspect drop in EF likely stress induced in setting of critical illness/DKA - RHC c/w low output, LV dominant shock (mRA 4, PAP 44/18, mPCWP 24, LVEDP 24, FICK CO 3.04, CI 1.85, Co-ox 46%) - Dobutamine stopped 5/3, co-ox 62% today with stable creatinine.  However, she feels much  worse with sinus tachycardia and nausea that likely reflects low cardiac output.  I am going to restart dobutamine 3 mcg/kg/min. I think she is going to be inotrope-dependent.   - Continue digoxin 0.125 daily.  - CVP 3-4, continue po Lasix 20 daily.  - No BB with shock.  - Continue spiro 12.5 mg daily.  - Has never received losartan as nurse has been holding (BP is labile).  Will stop for now.  - not candidate for SGLT2i w/ Type 1DM and DKA  - Now has end-stage HF d/t severe ICM. No options for revascularization. RV reduced on echo but PAPI okay. Starting workup for transplant with option for LVAD as back-up.  Will need blood glucose well-controlled, hgbA1c this admission was 7.8.  As above, think she will be inotrope-dependent.  Will  arrange for home dobutamine, will need tunneled PICC tomorrow.     2. CAD/NSTEMI  - h/o CABG x 2 2020, LIMA-LAD, RIMA-LCx - HS trop this admit peaked to 5048  - LHC w/ stable disease, 2/2 patent grafts. No culprit lesions  - suspect trop elevation 2/2 demand ischemia/acute HF  - No chest pain.  - continue medical management    3. DKA - Type 1 DM, developed after insulin pump malfunctioned  - Hgb A1C 10.6 => 7.8 on 5/1.  - now resolved - per Internal Medicine    4. Acute Hypoxic Respiratory Failure - developed after  volume IVF resuscitation  - PCWP 24 and LVEDP 24 on RHC. Diuresed. - Resolved on room air.    5. AKI  - Scr peaked to 1.7, in setting of DKA and low output HF - Resolved   6. Hypokalemia - Replace K as needed.    7. Frequent PVCs - supp K, keep > 4.0  -  keep > 2.0   Hopefully feels better back on dobutamine tomorrow. If so, can arrange for tunneled PICC and get her home for outpatient transplant workup.   Length of Stay: 7  Marca Ancona, MD  05/28/2022, 11:36 AM  Advanced Heart Failure Team Pager [phone] (M-F; 7a - 5p)  Please contact [facility] for night-coverage after hours (5p -7a ) and weekends on [email or web address]

## 2022-05-28 NOTE — Progress Notes (Addendum)
[facility]                                                                              Jennifer Hogan, is a 52 y.o. female, DOB - Jun 11, 1970, WUJ:[id number] Admit date - 05/20/2022    Outpatient Primary MD for the patient is Jennifer Rigg, NP  LOS - 7  days  Chief Complaint  Patient presents with   Emesis       Brief summary   Patient is a 52 year old female with diabetes mellitus type 1, on insulin pump, CAD status post CABG presented with nausea and vomiting and severe hyperglycemia.  Improved with aggressive hydration and then became short of breath and tachycardiac, was placed on 10 L O2, concern for pulmonary edema.  Patient was transferred to ICU and CCM was consulted. Significant Hospital Events:   4/28 admitted with nausea/vomiting and severe hyperglycemia in the setting of DKA due to question insulin pump failure.  Early morning patient seen with new significant oxygen requirement from room air to 10L with associated tachycardia and posterior crackles concerning for volume overload/pulmonary edema 4/29 Remains on 10L, trop to [facility] 4/30 O2 requirement down-trending, plan for cardiac cath-> severe double vessel CAD.  Recommended continue medical management, diuresis.  5/1: Transferred to floor, TRH assumed care    Assessment & Plan    Principal Problem:   Acute hypoxic respiratory failure (HCC) likely due to pulmonary edema, acute on chronic systolic CHF -On 9/56, patient had a significant new O2 requirement from room air to 10 L, + troponins, plateaued at 5048, BNP>4500 -Stable, O2 sats 96 to 100% on room air   Active Problems:   NSTEMI (non-ST elevated myocardial infarction) (HCC),  Acute on chronic systolic heart failure (HCC), CAD -History of three-vessel CAD with prior CABG 10/2018 -2D echo showed EF of 20 to 25% -Cardiac cath 05/23/2022 showed severe triple-vessel CAD, recommended continue medical management with diuresis. -Patient was placed  on aspirin, Plavix, IV heparin (now off) -CHF team following, was placed on dobutamine drip, DC'd on 5/3.   -transitioned to oral Lasix 20 mg daily, spironolactone 12.5 mg daily.   -End-stage CHF, due to severe ICM, per cardiology no options for revascularization, starting workup for transplant with option for LVAD as backup -Venous Doppler for VAD w/u workup negative for any DVT -Feeling poorly after dobutamine was discontinued.  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 Shirlee Latch, plan for tunneled PICC tomorrow for home dobutamine   Intractable nausea and vomiting -States since stopping dobutamine, feels poorly, nauseous and cold. -KUB negative for any ileus or obstruction.  Nausea improving with IV Reglan.    Hypokalemia -Replace as needed  Diabetes mellitus type 1 with DKA, uncontrolled -Initially treated with IV insulin drip and aggressive IV fluid hydration -Hemoglobin A1c 7.8.  It was 8.0 on 3/1.   CBG (last 3)  Recent Labs    05/28/22 0312 05/28/22 0604 05/28/22 1129  GLUCAP 237* 246* 209*  -Continue insulin pump   Acute kidney injury -Creatinine 1.67 on admission, likely due to dehydration and DKA -Resolved, creatinine 0.9  Normocytic anemia -MCV 89.4, baseline 10-13 -H&H stable  Estimated  body mass index is 25.73 kg/m as calculated from the following:   Height as of this encounter: 5\' 2"  (1.575 m).   Weight as of this encounter: 63.8 kg.  Code Status: Full code DVT Prophylaxis:  heparin injection 5,000 Units Start: 05/23/22 2200   Level of Care: Level of care: Progressive Family Communication:  Disposition Plan:      Remains inpatient appropriate:   Will await cardiology evaluation.  Procedures:  Cardiac cath 2D echo  Consultants:   CCM cardiology  Antimicrobials:   Anti-infectives (From admission, onward)    Start     Dose/Rate Route Frequency Ordered Stop   05/24/22 1000  cefTRIAXone (ROCEPHIN) 2 g in sodium chloride 0.9 % 100 mL IVPB        2  g 200 mL/hr over 30 Minutes Intravenous Every 24 hours 05/23/22 1012 05/27/22 2230   05/21/22 0915  cefTRIAXone (ROCEPHIN) 2 g in sodium chloride 0.9 % 100 mL IVPB  Status:  Discontinued        2 g 200 mL/hr over 30 Minutes Intravenous Every 24 hours 05/21/22 0827 05/23/22 1010          Medications  aspirin EC  81 mg Oral QHS   buPROPion  150 mg Oral BH-q7a   Chlorhexidine Gluconate Cloth  6 each Topical Daily   clopidogrel  75 mg Oral QHS   digoxin  0.125 mg Oral Daily   DULoxetine  30 mg Oral QHS   furosemide  20 mg Oral Daily   heparin  5,000 Units Subcutaneous Q8H   insulin pump   Subcutaneous TID WC, HS, 0200   levothyroxine  100 mcg Oral Q0600   metoCLOPramide (REGLAN) injection  10 mg Intravenous Q6H   mometasone-formoterol  2 puff Inhalation BID   montelukast  10 mg Oral QHS   pantoprazole  40 mg Oral BID   polyethylene glycol  17 g Oral Daily   senna-docusate  1 tablet Oral BID   sodium chloride flush  3 mL Intravenous Q12H   spironolactone  12.5 mg Oral Daily   traZODone  100 mg Oral QHS      Subjective:   Jennifer Hogan was seen and examined today.  Feeling poorly today, nauseous, feeling cold low energy, with multiple blankets.  No fevers.  Heart rate elevated.    Objective:   Vitals:   05/28/22 0305 05/28/22 0705 05/28/22 0918 05/28/22 1052  BP: (!) 158/101 (!) 93/57    Pulse: (!) 131 (!) 115    Resp: 16 18    Temp: 98.7 F (37.1 C) 98.5 F (36.9 C)  98.2 F (36.8 C)  TempSrc: Oral Oral  Oral  SpO2: 99% 95% 97%   Weight:      Height:        Intake/Output Summary (Last 24 hours) at 05/28/2022 1139 Last data filed at 05/28/2022 0300 Gross per 24 hour  Intake 293 ml  Output 1000 ml  Net -707 ml     Wt Readings from Last 3 Encounters:  05/26/22 63.8 kg  05/17/22 64.3 kg  05/03/22 65.6 kg   Physical Exam General: Alert and oriented x 3, NAD, ill-appearing Cardiovascular: S1 S2 clear, RRR.  Tachycardia Respiratory: CTAB Gastrointestinal:  Soft, nontender, nondistended, NBS Ext: no pedal edema bilaterally Neuro: no new deficits Skin: No rashes Psych: Normal affect    Data Reviewed:  I have personally reviewed following labs    CBC Lab Results  Component Value Date   WBC 10.7 (H) 05/28/2022  RBC 3.81 (L) 05/28/2022   HGB 11.6 (L) 05/28/2022   HCT 34.0 (L) 05/28/2022   MCV 89.2 05/28/2022   MCH 30.4 05/28/2022   PLT 416 (H) 05/28/2022   MCHC 34.1 05/28/2022   RDW 14.6 05/28/2022   LYMPHSABS 0.5 (L) 05/20/2022   MONOABS 0.6 05/20/2022   EOSABS 0.0 05/20/2022   BASOSABS 0.0 05/20/2022     Last metabolic panel Lab Results  Component Value Date   NA 134 (L) 05/28/2022   K 4.1 05/28/2022   CL 97 (L) 05/28/2022   CO2 20 (L) 05/28/2022   BUN 18 05/28/2022   CREATININE 0.99 05/28/2022   GLUCOSE 248 (H) 05/28/2022   GFRNONAA >60 05/28/2022   GFRAA >60 07/14/2019   CALCIUM 9.2 05/28/2022   PHOS 4.0 05/22/2022   PROT 8.0 05/20/2022   ALBUMIN 4.5 05/20/2022   LABGLOB 2.9 02/15/2022   AGRATIO 1.6 02/15/2022   BILITOT 0.7 05/20/2022   ALKPHOS 84 05/20/2022   AST 28 05/20/2022   ALT 19 05/20/2022   ANIONGAP 17 (H) 05/28/2022    CBG (last 3)  Recent Labs    05/28/22 0312 05/28/22 0604 05/28/22 1129  GLUCAP 237* 246* 209*      Coagulation Profile: No results for input(s): "INR", "PROTIME" in the last 168 hours.   Radiology Studies: I have personally reviewed the imaging studies  VAS US DOPPLER PRE VAD  Result Date: 05/27/2022 PERIOPERATIVE VASCULAR EVALUATION Patient Name:  Jennifer Hogan  Date of Exam:   05/26/2022 Medical Rec #: [id number]          Accession #:    [id number] Date of Birth: 01-08-1971         Patient Gender: F Patient Age:   21 years Exam Location:  [facility] Procedure:      VAS US DOPPLER PRE VAD Referring Phys: Lillia Abed [facility] --------------------------------------------------------------------------------  Indications:      Pre-VAD. Risk Factors:     Hyperlipidemia,  Diabetes, past history of smoking, prior MI,                   coronary artery disease. Other Factors:    CHF, S/P CABG x 3. Comparison Study: Previous exam (Pre-CABG) on 10/31/18 WNL Performing Technologist: Ernestene Mention RVT, RDMS  Examination Guidelines: A complete evaluation includes B-mode imaging, spectral Doppler, color Doppler, and power Doppler as needed of all accessible portions of each vessel. Bilateral testing is considered an integral part of a complete examination. Limited examinations for reoccurring indications may be performed as noted.  Right Carotid Findings: +----------+--------+--------+--------+---------------------+------------------+           PSV cm/sEDV cm/sStenosisDescribe             Comments           +----------+--------+--------+--------+---------------------+------------------+ CCA Prox  100     24                                   intimal thickening +----------+--------+--------+--------+---------------------+------------------+ CCA Distal77      24                                   intimal thickening +----------+--------+--------+--------+---------------------+------------------+ ICA Prox  80      29              hypoechoic and  heterogenous                            +----------+--------+--------+--------+---------------------+------------------+ ICA Mid   114     50                                                      +----------+--------+--------+--------+---------------------+------------------+ ICA Distal114     45                                                      +----------+--------+--------+--------+---------------------+------------------+ ECA       95      11                                                      +----------+--------+--------+--------+---------------------+------------------+ +----------+--------+-------+----------------+------------+            PSV cm/sEDV cmsDescribe        Arm Pressure +----------+--------+-------+----------------+------------+ Subclavian97             Multiphasic, [id number]          +----------+--------+-------+----------------+------------+ +---------+--------+--+--------+--+---------+ VertebralPSV cm/s49EDV cm/s18Antegrade +---------+--------+--+--------+--+---------+ Left Carotid Findings: +----------+--------+--------+--------+--------+------------------+           PSV cm/sEDV cm/sStenosisDescribeComments           +----------+--------+--------+--------+--------+------------------+ CCA Prox  106     29                                         +----------+--------+--------+--------+--------+------------------+ CCA Distal71      19                                         +----------+--------+--------+--------+--------+------------------+ ICA Prox  62      19                      intimal thickening +----------+--------+--------+--------+--------+------------------+ ICA Distal113     50                                         +----------+--------+--------+--------+--------+------------------+ ECA       71      9                                          +----------+--------+--------+--------+--------+------------------+ +----------+--------+--------+----------------+------------+ SubclavianPSV cm/sEDV cm/sDescribe        Arm Pressure +----------+--------+--------+----------------+------------+           135             Multiphasic, [id number]          +----------+--------+--------+----------------+------------+ +---------+--------+--+--------+--+---------+ VertebralPSV cm/s54EDV cm/s14Antegrade +---------+--------+--+--------+--+---------+  ABI Findings: +---------+------------------+-----+---------+--------+  Right    Rt Pressure (mmHg)IndexWaveform Comment  +---------+------------------+-----+---------+--------+ Brachial 107                    triphasic          +---------+------------------+-----+---------+--------+ PTA      158               1.45 triphasic         +---------+------------------+-----+---------+--------+ DP       111               1.02 triphasic         +---------+------------------+-----+---------+--------+ Great Toe71                     Normal            +---------+------------------+-----+---------+--------+ +---------+------------------+-----+---------+-------+ Left     Lt Pressure (mmHg)IndexWaveform Comment +---------+------------------+-----+---------+-------+ Brachial 109                    triphasic        +---------+------------------+-----+---------+-------+ PTA      130               1.19 triphasic        +---------+------------------+-----+---------+-------+ DP       114               1.05 triphasic        +---------+------------------+-----+---------+-------+ Demaris Callander                     Normal           +---------+------------------+-----+---------+-------+ +-------+---------------+----------------+ ABI/TBIToday's ABI/TBIPrevious ABI/TBI +-------+---------------+----------------+ Right  1.45 / 0.65                     +-------+---------------+----------------+ Left   1.19 / 0.72                     +-------+---------------+----------------+  Summary: Right Carotid: The extracranial vessels were near-normal with only minimal wall                thickening or plaque. Left Carotid: The extracranial vessels were near-normal with only minimal wall               thickening or plaque. Vertebrals:  Bilateral vertebral arteries demonstrate antegrade flow. Subclavians: Normal flow hemodynamics were seen in bilateral subclavian              arteries.  *See table(s) above for measurements and observations. Right ABI: Resting right ankle-brachial index indicates noncompressible right lower extremity arteries. The right toe-brachial index is mildly abnormal. Left ABI: Resting left  ankle-brachial index is within normal range. The left toe-brachial index is normal.  Electronically signed by Coral Else MD on 05/27/2022 at 3:04:25 PM.    Final    VAS [name] LOWER EXTREMITY VENOUS (DVT)  Result Date: 05/27/2022  Lower Venous DVT Study Patient Name:  Jennifer Hogan  Date of Exam:   05/26/2022 Medical Rec #: [id number]          Accession #:    [id number] Date of Birth: December 23, 1970         Patient Gender: F Patient Age:   22 years Exam Location:  [facility] Procedure:      VAS [name] LOWER EXTREMITY VENOUS (DVT) Referring Phys: Baldwin Racicot --------------------------------------------------------------------------------  Indications: Pre-VAD.  Comparison Study: No previous exams Performing Technologist: Jody Hill RVT, RDMS  Examination Guidelines:  A complete evaluation includes B-mode imaging, spectral Doppler, color Doppler, and power Doppler as needed of all accessible portions of each vessel. Bilateral testing is considered an integral part of a complete examination. Limited examinations for reoccurring indications may be performed as noted. The reflux portion of the exam is performed with the patient in reverse Trendelenburg.  +---------+---------------+---------+-----------+----------+--------------+ RIGHT    CompressibilityPhasicitySpontaneityPropertiesThrombus Aging +---------+---------------+---------+-----------+----------+--------------+ CFV      Full           Yes      Yes                                 +---------+---------------+---------+-----------+----------+--------------+ SFJ      Full                                                        +---------+---------------+---------+-----------+----------+--------------+ FV Prox  Full           Yes      Yes                                 +---------+---------------+---------+-----------+----------+--------------+ FV Mid   Full           Yes      Yes                                  +---------+---------------+---------+-----------+----------+--------------+ FV DistalFull           Yes      Yes                                 +---------+---------------+---------+-----------+----------+--------------+ PFV      Full                                                        +---------+---------------+---------+-----------+----------+--------------+ POP      Full           Yes      Yes                                 +---------+---------------+---------+-----------+----------+--------------+ PTV      Full                                                        +---------+---------------+---------+-----------+----------+--------------+ PERO     Full                                                        +---------+---------------+---------+-----------+----------+--------------+   +---------+---------------+---------+-----------+-------------+--------------+ LEFT  CompressibilityPhasicitySpontaneityProperties   Thrombus Aging +---------+---------------+---------+-----------+-------------+--------------+ CFV      Full           Yes      Yes                                    +---------+---------------+---------+-----------+-------------+--------------+ SFJ      Full                                                           +---------+---------------+---------+-----------+-------------+--------------+ FV Prox  Full           Yes      Yes                                    +---------+---------------+---------+-----------+-------------+--------------+ FV Mid   Full           Yes      Yes                                    +---------+---------------+---------+-----------+-------------+--------------+ FV DistalFull           Yes      Yes                                    +---------+---------------+---------+-----------+-------------+--------------+ PFV      Full                                                            +---------+---------------+---------+-----------+-------------+--------------+ POP      Full           Yes      Yes        rouleaux flow               +---------+---------------+---------+-----------+-------------+--------------+ PTV      Full                                                           +---------+---------------+---------+-----------+-------------+--------------+ PERO     Full                                                           +---------+---------------+---------+-----------+-------------+--------------+     Summary: BILATERAL: - No evidence of deep vein thrombosis seen in the lower extremities, bilaterally. -No evidence of popliteal cyst, bilaterally.   *See table(s) above for measurements and observations. Electronically signed by Coral Else MD on 05/27/2022 at 3:02:18 PM.    Final  DG Abd Portable 2V  Result Date: 05/27/2022 CLINICAL DATA:  Nausea vomiting. EXAM: PORTABLE ABDOMEN - 2 VIEW COMPARISON:  CT abdomen pelvis, 05/20/2022. Chest radiograph, 05/22/2022. FINDINGS: No bowel dilation to suggest obstruction or significant adynamic ileus. Relative paucity of bowel gas. No evidence of renal or ureteral stones. Leg chronic device overlies the right mid abdomen. Soft tissues otherwise unremarkable. Most of the chest was visualized. Previous median sternotomy. Cardiac silhouette normal in size. Clear lungs. Opacities noted on the prior chest radiograph have resolved. No acute skeletal abnormality. IMPRESSION: 1. No acute findings.  No evidence of bowel obstruction. Electronically Signed   By: Amie Portland M.D.   On: 05/27/2022 13:11       Sohail Capraro M.D. [facility] 05/28/2022, 11:39 AM  Available via Epic secure chat 7am-7pm After 7 pm, please refer to night coverage provider listed on amion.

## 2022-05-28 NOTE — Progress Notes (Signed)
Mobility Specialist Progress Note   05/28/22 1602  Mobility  Activity Turned to back - supine (Bed level exercises)  Level of Assistance Standby assist, set-up cues, supervision of patient - no hands on  Assistive Device None  Range of Motion/Exercises Active  Activity Response Tolerated well  Mobility Referral Yes  $Mobility charge 1 Mobility   Pre Mobility: 107 HR, 98% SpO2 During Mobility: 118 HR, 98%SpO2 Post Mobility: 109 HR, 105/50 BP, 99% SpO2  Session limited today by N/V. Pt deferring OOB mobility but agreeable to bed level exercises. Tolerated well w/o complaint, left supine w/ all needs met and call bell by side.  Frederico Hamman Mobility Specialist Please contact via SecureChat or  Rehab office at [phone]

## 2022-05-28 NOTE — Progress Notes (Signed)
RT note. Patient not requiring bipap at this time. RT will continue to monitor.

## 2022-05-29 ENCOUNTER — Telehealth: Payer: Self-pay

## 2022-05-29 ENCOUNTER — Other Ambulatory Visit: Payer: Self-pay

## 2022-05-29 ENCOUNTER — Inpatient Hospital Stay (HOSPITAL_COMMUNITY): Payer: Medicare Other

## 2022-05-29 DIAGNOSIS — I214 Non-ST elevation (NSTEMI) myocardial infarction: Secondary | ICD-10-CM | POA: Diagnosis not present

## 2022-05-29 DIAGNOSIS — I5021 Acute systolic (congestive) heart failure: Secondary | ICD-10-CM | POA: Diagnosis not present

## 2022-05-29 DIAGNOSIS — J9601 Acute respiratory failure with hypoxia: Secondary | ICD-10-CM | POA: Diagnosis not present

## 2022-05-29 DIAGNOSIS — E1010 Type 1 diabetes mellitus with ketoacidosis without coma: Secondary | ICD-10-CM | POA: Diagnosis not present

## 2022-05-29 DIAGNOSIS — E1059 Type 1 diabetes mellitus with other circulatory complications: Secondary | ICD-10-CM

## 2022-05-29 HISTORY — PX: IR FLUORO GUIDE CV LINE RIGHT: IMG2283

## 2022-05-29 LAB — CBC
HCT: 31.8 % — ABNORMAL LOW (ref 36.0–46.0)
Hemoglobin: 10.7 g/dL — ABNORMAL LOW (ref 12.0–15.0)
MCH: 30.2 pg (ref 26.0–34.0)
MCHC: 33.6 g/dL (ref 30.0–36.0)
MCV: 89.8 fL (ref 80.0–100.0)
Platelets: 388 10*3/uL (ref 150–400)
RBC: 3.54 MIL/uL — ABNORMAL LOW (ref 3.87–5.11)
RDW: 14.7 % (ref 11.5–15.5)
WBC: 5.7 10*3/uL (ref 4.0–10.5)
nRBC: 0 % (ref 0.0–0.2)

## 2022-05-29 LAB — GLUCOSE, CAPILLARY
Glucose-Capillary: 119 mg/dL — ABNORMAL HIGH (ref 70–99)
Glucose-Capillary: 123 mg/dL — ABNORMAL HIGH (ref 70–99)
Glucose-Capillary: 215 mg/dL — ABNORMAL HIGH (ref 70–99)
Glucose-Capillary: 190 mg/dL — ABNORMAL HIGH (ref 70–99)

## 2022-05-29 LAB — BASIC METABOLIC PANEL
Anion gap: 11 (ref 5–15)
BUN: 16 mg/dL (ref 6–20)
CO2: 26 mmol/L (ref 22–32)
Calcium: 9 mg/dL (ref 8.9–10.3)
Chloride: 97 mmol/L — ABNORMAL LOW (ref 98–111)
Creatinine, Ser: 1.03 mg/dL — ABNORMAL HIGH (ref 0.44–1.00)
GFR, Estimated: >60 mL/min (ref >60)
Glucose, Bld: 127 mg/dL — ABNORMAL HIGH (ref 70–99)
Potassium: 3.3 mmol/L — ABNORMAL LOW (ref 3.5–5.1)
Sodium: 134 mmol/L — ABNORMAL LOW (ref 135–145)

## 2022-05-29 LAB — COOXEMETRY PANEL
Carboxyhemoglobin: 1.4 % (ref 0.5–1.5)
Methemoglobin: <0.7 % (ref 0.0–1.5)
O2 Saturation: 82.1 %
Total hemoglobin: 10.9 g/dL — ABNORMAL LOW (ref 12.0–16.0)

## 2022-05-29 MED ORDER — DEXCOM G6 SENSOR MISC
1 refills | Status: DC
Start: 2022-05-29 — End: 2022-08-28
  Filled 2022-05-29: qty 3, 28d supply, fill #0
  Filled 2022-06-02: qty 9, 90d supply, fill #0
  Filled 2022-08-08 – 2022-08-10 (×2): qty 9, 90d supply, fill #1

## 2022-05-29 MED ORDER — FUROSEMIDE 20 MG PO TABS: 20.0000 mg | ORAL_TABLET | Freq: Every day | ORAL | Status: DC

## 2022-05-29 MED ORDER — HEPARIN SOD (PORK) LOCK FLUSH 100 UNIT/ML IV SOLN
INTRAVENOUS | Status: AC
  Filled 2022-05-29: qty 5

## 2022-05-29 MED ORDER — POTASSIUM CHLORIDE CRYS ER 20 MEQ PO TBCR
40.0000 meq | EXTENDED_RELEASE_TABLET | Freq: Once | ORAL | Status: AC
  Administered 2022-05-29: 40 meq via ORAL
  Filled 2022-05-29: qty 2

## 2022-05-29 MED ORDER — SPIRONOLACTONE 25 MG PO TABS
25.0000 mg | ORAL_TABLET | Freq: Every day | ORAL | Status: DC
  Administered 2022-05-29 – 2022-06-01 (×4): 25 mg via ORAL
  Filled 2022-05-29 (×4): qty 1

## 2022-05-29 MED ORDER — DEXCOM G6 TRANSMITTER MISC
1 refills | Status: DC
Start: 2022-05-29 — End: 2022-11-09
  Filled 2022-05-29: qty 1, 84d supply, fill #0
  Filled 2022-06-02: qty 1, 90d supply, fill #0
  Filled 2022-09-12: qty 1, 90d supply, fill #1

## 2022-05-29 MED ORDER — LIDOCAINE-EPINEPHRINE 1 %-1:100000 IJ SOLN
INTRAMUSCULAR | Status: AC
  Filled 2022-05-29: qty 1

## 2022-05-29 NOTE — Progress Notes (Signed)
[name], female   DOB: Aug 03, 1970, 52 y.o.   MRN: [id number]     Advanced Heart Failure Rounding Note  PCP-Cardiologist: Armanda Magic, MD   Subjective:    Dobutamine stopped 5/3. Symptomatic off DBA so restarted yesterday. Co-ox 82%  with stable creatinine at 1.03.  CVP 0.    Symptoms resolved. No longer nauseous, feels good this morning. Did bed exercises yesterday.   Objective:   Weight Range: 63.8 kg Body mass index is 25.73 kg/m.   Vital Signs:   Temp:  [97.6 F (36.4 C)-98.2 F (36.8 C)] 98.1 F (36.7 C) (05/06 0339) Pulse Rate:  [88-113] 88 (05/06 0339) Resp:  [16-20] 16 (05/06 0339) BP: (87-110)/(50-66) 106/66 (05/06 0339) SpO2:  [97 %-100 %] 98 % (05/06 0339) Last BM Date : 05/25/22  Weight change: Filed Weights   05/24/22 0559 05/25/22 0500 05/26/22 0625  Weight: 64.7 kg 64.8 kg 63.8 kg    Intake/Output:   Intake/Output Summary (Last 24 hours) at 05/29/2022 0729 Last data filed at 05/29/2022 0300 Gross per 24 hour  Intake 43.4 ml  Output --  Net 43.4 ml    CVP 0  Physical Exam  General:  well appearing.  No respiratory difficulty HEENT: normal Neck: supple. JVD flat. Carotids 2+ bilat; no bruits. No lymphadenopathy or thyromegaly appreciated.CVC L IJ Cor: PMI nondisplaced. Regular rate & rhythm. No rubs, gallops or murmurs. Lungs: clear Abdomen: soft, nontender, nondistended. No hepatosplenomegaly. No bruits or masses. Good bowel sounds. Extremities: no cyanosis, clubbing, rash, edema  Neuro: alert & oriented x 3, cranial nerves grossly intact. moves all 4 extremities w/o difficulty. Affect pleasant.   Telemetry   NSR 90s (Personally reviewed)    Labs    CBC Recent Labs    05/28/22 0315 05/29/22 0533  WBC 10.7* 5.7  HGB 11.6* 10.7*  HCT 34.0* 31.8*  MCV 89.2 89.8  PLT 416* 388    Basic Metabolic Panel Recent Labs    40/98/11 0315 05/29/22 0533  NA 134* 134*  K 4.1 3.3*  CL 97* 97*  CO2 20* 26  GLUCOSE 248*  127*  BUN 18 16  CREATININE 0.99 1.03*  CALCIUM 9.2 9.0   Liver Function Tests No results for input(s): "AST", "ALT", "ALKPHOS", "BILITOT", "PROT", "ALBUMIN" in the last 72 hours. No results for input(s): "LIPASE", "AMYLASE" in the last 72 hours. Cardiac Enzymes No results for input(s): "CKTOTAL", "CKMB", "CKMBINDEX", "TROPONINI" in the last 72 hours.  BNP: BNP (last 3 results) Recent Labs    11/03/21 1151 05/22/22 1529  BNP 127.9* >4,500.0*    ProBNP (last 3 results) No results for input(s): "PROBNP" in the last 8760 hours.   D-Dimer No results for input(s): "DDIMER" in the last 72 hours. Hemoglobin A1C No results for input(s): "HGBA1C" in the last 72 hours.  Fasting Lipid Panel No results for input(s): "CHOL", "HDL", "LDLCALC", "TRIG", "CHOLHDL", "LDLDIRECT" in the last 72 hours. Thyroid Function Tests No results for input(s): "TSH", "T4TOTAL", "T3FREE", "THYROIDAB" in the last 72 hours.  Invalid input(s): "FREET3"  Other results:   Imaging    No results found.   Medications:     Scheduled Medications:  aspirin EC  81 mg Oral QHS   buPROPion  150 mg Oral BH-q7a   Chlorhexidine Gluconate Cloth  6 each Topical Daily   clopidogrel  75 mg Oral QHS   digoxin  0.125 mg Oral Daily   DULoxetine  30 mg Oral QHS   furosemide  20 mg  Oral Daily   heparin  5,000 Units Subcutaneous Q8H   insulin pump   Subcutaneous TID WC, HS, 0200   levothyroxine  100 mcg Oral Q0600   metoCLOPramide (REGLAN) injection  10 mg Intravenous Q6H   mometasone-formoterol  2 puff Inhalation BID   montelukast  10 mg Oral QHS   pantoprazole  40 mg Oral BID   polyethylene glycol  17 g Oral Daily   potassium chloride  40 mEq Oral Once   senna-docusate  1 tablet Oral BID   sodium chloride flush  3 mL Intravenous Q12H   spironolactone  12.5 mg Oral Daily   traZODone  100 mg Oral QHS    Infusions:  DOBUTamine 3 mcg/kg/min (05/28/22 1800)   promethazine (PHENERGAN) injection (IM or IVPB)       PRN Medications: acetaminophen, dextrose, diclofenac Sodium, diphenhydrAMINE, fluticasone, levalbuterol, ondansetron (ZOFRAN) IV, mouth rinse, polyethylene glycol, promethazine (PHENERGAN) injection (IM or IVPB), sodium chloride flush    Patient Profile   52 y.o.with HFrEF, CAD, s/p CABG in 10/2018 (LIMA to the LAD/ramus intermedius and RIMA to the obtuse marginal), DM Type I,  anemia, anxiety, hypothyroidism, and hyperlipidemia. Admitted w/ DKA. Hospitalization c/b a/c systolic heart failure w/ low output.   Assessment/Plan  1. 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  - Echo 09/2020 EF 20-25%.  - LHC with 09/2020 with severe coronary disease. Total occlusion of the proximal LAD, moderate stenosis of the intermediate branch, and severe ostial circumflex in-stent restenosis.  - Echo 12/27/20  EF 40-45% RV mildly HK - Echo 12/23 EF 45-50%  - Echo this admit EF down 20-25% w/ AK of anteroseptal wall and apex, RV severely reduced. Also w/ Hs trop elevation peaking at 5,048. LHC w/ stable coronary anatomy and 2/2 patent bypass grafts. Suspect drop in EF likely stress induced in setting of critical illness/DKA - RHC c/w low output, LV dominant shock (mRA 4, PAP 44/18, mPCWP 24, LVEDP 24, FICK CO 3.04, CI 1.85, Co-ox 46%) - Dobutamine stopped 5/3, quickly got symptomatic (nauseous and tachycardic). DBA restarted @3mcg /kg/min. Co-ox 82% today with stable creatinine.    - Continue digoxin 0.125 daily.  - CVP 0, hold po Lasix 20 daily today, may not need daily diuretic. Took PRN at home - No BB with shock.  - Increase spiro 12.5>25 mg daily.  - Has never received losartan as nurse has been holding (BP is labile).  Will stop for now.  - not candidate for SGLT2i w/ Type 1DM and DKA  - Now has end-stage HF d/t severe ICM. No options for revascularization. RV reduced on echo but PAPI okay. Starting workup for transplant with option for LVAD  as back-up.  Will need blood glucose well-controlled, hgbA1c this admission was 7.8.  As above, think she will be inotrope-dependent.  Will arrange home dobutamine, will need tunneled PICC today.     2. CAD/NSTEMI  - h/o CABG x 2 2020, LIMA-LAD, RIMA-LCx - HS trop this admit peaked to 5048  - LHC w/ stable disease, 2/2 patent grafts. No culprit lesions  - suspect trop elevation 2/2 demand ischemia/acute HF  - No chest pain.  - continue medical management    3. DKA - Type 1 DM, developed after insulin pump malfunctioned  - Hgb A1C 10.6 => 7.8 on 5/1.  - now resolved - per Internal Medicine    4. Acute Hypoxic Respiratory Failure - developed after volume IVF resuscitation  -  PCWP 24 and LVEDP 24 on RHC. Diuresed. - Resolved on room air.    5. AKI  - Scr peaked to 1.7, in setting of DKA and low output HF - Resolved   6. Hypokalemia - Replace K as needed.  - 3.3 today - increase spiro as above   7. Frequent PVCs - supp K, keep > 4.0  - keep > 2.0   Feels better today, order in for IR for tunneled PICC placement. Plan to get her home for outpatient transplant workup. Has f/u scheduled.   Length of Stay: 8  Alen Bleacher, NP  05/29/2022, 7:29 AM  Advanced Heart Failure Team Pager [phone] (M-F; 7a - 5p)  Please contact [facility] for night-coverage after hours (5p -7a ) and weekends on [email or web address]

## 2022-05-29 NOTE — Telephone Encounter (Signed)
Pharmacy Patient Advocate Encounter  Prior Authorization for Dexcom G6 transmitter has been approved   Effective dates: 05/15/22 through 11/25/22

## 2022-05-29 NOTE — Progress Notes (Signed)
[facility]                                                                              Jennifer Hogan, is a 52 y.o. female, DOB - 12-11-1970, ZOX:[id number] Admit date - 05/20/2022    Outpatient Primary MD for the patient is [name], NP  LOS - 8  days  Chief Complaint  Patient presents with   Emesis       Brief summary   Patient is a 52 year old female with diabetes mellitus type 1, on insulin pump, CAD status post CABG presented with nausea and vomiting and severe hyperglycemia.  Improved with aggressive hydration and then became short of breath and tachycardiac, was placed on 10 L O2, concern for pulmonary edema.  Patient was transferred to ICU and CCM was consulted. Significant Hospital Events:   4/28 admitted with nausea/vomiting and severe hyperglycemia in the setting of DKA due to question insulin pump failure.  Early morning patient seen with new significant oxygen requirement from room air to 10L with associated tachycardia and posterior crackles concerning for volume overload/pulmonary edema 4/29 Remains on 10L, trop to [facility] 4/30 O2 requirement down-trending, plan for cardiac cath-> severe double vessel CAD.  Recommended continue medical management, diuresis.  5/1: Transferred to floor, TRH assumed care    Assessment & Plan    Principal Problem:   Acute hypoxic respiratory failure (HCC) likely due to pulmonary edema, acute on chronic systolic CHF -On 8/11, patient had a significant new O2 requirement from room air to 10 L, + troponins, plateaued at 5048, BNP>4500 -Stable, currently on room air   Active Problems:   NSTEMI (non-ST elevated myocardial infarction) (HCC),  Acute on chronic systolic heart failure (HCC), CAD -History of three-vessel CAD with prior CABG 10/2018 -2D echo showed EF of 20 to 25% -Cardiac cath 05/23/2022 showed severe triple-vessel CAD, recommended continue medical management with diuresis. -Patient was placed on  aspirin, Plavix, IV heparin (now off) -CHF team following, was placed on dobutamine drip, DC'd on 5/3.   -transitioned to oral Lasix 20 mg daily, spironolactone 12.5 mg daily.   -End-stage CHF, due to severe ICM, per cardiology no options for revascularization, starting workup for transplant with option for LVAD as backup -Venous Doppler for VAD w/u workup negative for any DVT -Feeling poorly after dobutamine was discontinued.  Likely inotrope dependent -Dobutamine resumed on 5/1 -Tunneled tunneled dual-lumen power PICC placed for home dobutamine infusion, will need home health  Intractable nausea and vomiting -States since stopping dobutamine, feels poorly, nauseous and cold. -KUB negative for any ileus or obstruction. -Nausea improved, tolerating diet   Hypokalemia -Replaced  Diabetes mellitus type 1 with DKA, uncontrolled -Initially treated with IV insulin drip and aggressive IV fluid hydration -Hemoglobin A1c 7.8.  It was 8.0 on 3/1.   CBG (last 3)  Recent Labs    05/29/22 0341 05/29/22 0626 05/29/22 1125  GLUCAP 119* 123* 215*  -Continue home insulin pump   Acute kidney injury -Creatinine 1.67 on admission, likely due to dehydration and DKA -Resolved, creatinine 0.9  Normocytic anemia -MCV 89.4, baseline 10-13 -H&H stable  Estimated body mass index  is 25.73 kg/m as calculated from the following:   Height as of this encounter: 5\' 2"  (1.575 m).   Weight as of this encounter: 63.8 kg.  Code Status: Full code DVT Prophylaxis:  heparin injection 5,000 Units Start: 05/23/22 2200   Level of Care: Level of care: Progressive Family Communication:  Disposition Plan:      Remains inpatient appropriate:    Procedures:  Cardiac cath 2D echo  Consultants:   CCM cardiology  Antimicrobials:   Anti-infectives (From admission, onward)    Start     Dose/Rate Route Frequency Ordered Stop   05/24/22 1000  cefTRIAXone (ROCEPHIN) 2 g in sodium chloride 0.9 % 100 mL IVPB         2 g 200 mL/hr over 30 Minutes Intravenous Every 24 hours 05/23/22 1012 05/27/22 2230   05/21/22 0915  cefTRIAXone (ROCEPHIN) 2 g in sodium chloride 0.9 % 100 mL IVPB  Status:  Discontinued        2 g 200 mL/hr over 30 Minutes Intravenous Every 24 hours 05/21/22 0827 05/23/22 1010          Medications  aspirin EC  81 mg Oral QHS   buPROPion  150 mg Oral BH-q7a   Chlorhexidine Gluconate Cloth  6 each Topical Daily   digoxin  0.125 mg Oral Daily   DULoxetine  30 mg Oral QHS   heparin  5,000 Units Subcutaneous Q8H   insulin pump   Subcutaneous TID WC, HS, 0200   levothyroxine  100 mcg Oral Q0600   metoCLOPramide (REGLAN) injection  10 mg Intravenous Q6H   mometasone-formoterol  2 puff Inhalation BID   montelukast  10 mg Oral QHS   pantoprazole  40 mg Oral BID   polyethylene glycol  17 g Oral Daily   senna-docusate  1 tablet Oral BID   sodium chloride flush  3 mL Intravenous Q12H   spironolactone  25 mg Oral Daily   traZODone  100 mg Oral QHS      Subjective:   Jennifer Hogan was seen and examined today. Patient feels a lot better after resuming dobutamine infusion yesterday.  No nausea or vomiting this morning.  Objective:   Vitals:   05/28/22 2007 05/29/22 0006 05/29/22 0010 05/29/22 0339  BP: (!) 110/58 (!) 87/61 103/61 106/66  Pulse: (!) 101 (!) 106  88  Resp: 20 18  16   Temp: 98 F (36.7 C) 97.6 F (36.4 C)  98.1 F (36.7 C)  TempSrc: Oral Oral  Oral  SpO2: 99% 98%  98%  Weight:      Height:        Intake/Output Summary (Last 24 hours) at 05/29/2022 1308 Last data filed at 05/29/2022 0757 Gross per 24 hour  Intake 163.4 ml  Output --  Net 163.4 ml     Wt Readings from Last 3 Encounters:  05/26/22 63.8 kg  05/17/22 64.3 kg  05/03/22 65.6 kg   Physical Exam General: Alert and oriented x 3, NAD Cardiovascular: S1 S2 clear, RRR.  Tachycardia Respiratory: CTAB, no wheezing Gastrointestinal: Soft, nontender, nondistended, NBS Ext: no pedal edema  bilaterally Neuro: no new deficits Skin: No rashes Psych: Normal affect   Data Reviewed:  I have personally reviewed following labs    CBC Lab Results  Component Value Date   WBC 5.7 05/29/2022   RBC 3.54 (L) 05/29/2022   HGB 10.7 (L) 05/29/2022   HCT 31.8 (L) 05/29/2022   MCV 89.8 05/29/2022   MCH 30.2 05/29/2022  PLT 388 05/29/2022   MCHC 33.6 05/29/2022   RDW 14.7 05/29/2022   LYMPHSABS 0.5 (L) 05/20/2022   MONOABS 0.6 05/20/2022   EOSABS 0.0 05/20/2022   BASOSABS 0.0 05/20/2022     Last metabolic panel Lab Results  Component Value Date   NA 134 (L) 05/29/2022   K 3.3 (L) 05/29/2022   CL 97 (L) 05/29/2022   CO2 26 05/29/2022   BUN 16 05/29/2022   CREATININE 1.03 (H) 05/29/2022   GLUCOSE 127 (H) 05/29/2022   GFRNONAA >60 05/29/2022   GFRAA >60 07/14/2019   CALCIUM 9.0 05/29/2022   PHOS 4.0 05/22/2022   PROT 8.0 05/20/2022   ALBUMIN 4.5 05/20/2022   LABGLOB 2.9 02/15/2022   AGRATIO 1.6 02/15/2022   BILITOT 0.7 05/20/2022   ALKPHOS 84 05/20/2022   AST 28 05/20/2022   ALT 19 05/20/2022   ANIONGAP 11 05/29/2022    CBG (last 3)  Recent Labs    05/29/22 0341 05/29/22 0626 05/29/22 1125  GLUCAP 119* 123* 215*      Coagulation Profile: No results for input(s): "INR", "PROTIME" in the last 168 hours.   Radiology Studies: I have personally reviewed the imaging studies  No results found.     Thad Ranger M.D. [facility] 05/29/2022, 1:08 PM  Available via Epic secure chat 7am-7pm After 7 pm, please refer to night coverage provider listed on amion.

## 2022-05-29 NOTE — Telephone Encounter (Signed)
Patient Advocate Encounter   Received notification from pt msgs that prior authorization is required for Dexcom G6 sensor  Submitted: 05/29/22 Key [id number]  Sent for expedited review Status is pending

## 2022-05-29 NOTE — Procedures (Signed)
Interventional Radiology Procedure Note  Procedure: Tunneled dual-lumen power line via right IJ. Cath tips at the CAJ and ready for use.   Complications: None  Estimated Blood Loss: None  Recommendations:  - Routine line care  Signed,  Sterling Big, MD

## 2022-05-29 NOTE — TOC Progression Note (Addendum)
Transition of Care [facility]) - Progression Note    Patient Details  Name: KAZMIRA HARMENING MRN: [id number] Date of Birth: 18-Mar-1970  Transition of [facility]) CM/SW Contact  Elliot Cousin, RN Phone Number: [phone] 05/29/2022, 9:49 AM  Clinical Narrative:   HF TOC CM contacted [employer] rep, Pam for Home Dobutamine. Will need HH RN orders.   2:30 pm [employer] #[phone]. Faxed orders for Home IV Dobutamine fax # [phone], states they have sent over for authorization. They will not be able to provide a [facility] RN to go out to home for dressing changes or lab draw. Pt will need to be set up to go to an outpatient infusion clinic for PICC care. They will provide the medication for patient. Contacted [facility] and left message for return call.       Expected Discharge Plan: Home/Self Care Barriers to Discharge: Continued Medical Work up  Expected Discharge Plan and Services   Discharge Planning Services: CM Consult   Living arrangements for the past 2 months: Single Family Home                                       Social Determinants of Health (SDOH) Interventions SDOH Screenings   Food Insecurity: No Food Insecurity (05/26/2022)  Recent Concern: Food Insecurity - Food Insecurity Present (05/15/2022)  Housing: Low Risk  (05/26/2022)  Recent Concern: Housing - High Risk (05/15/2022)  Transportation Needs: No Transportation Needs (05/26/2022)  Recent Concern: Transportation Needs - Unmet Transportation Needs (05/15/2022)  Utilities: Not At Risk (05/26/2022)  Alcohol Screen: Low Risk  (10/05/2020)  Depression (PHQ2-9): High Risk (02/15/2022)  Financial Resource Strain: High Risk (05/15/2022)  Physical Activity: Unknown (05/15/2022)  Social Connections: Socially Isolated (05/15/2022)  Stress: Stress Concern Present (05/15/2022)  Tobacco Use: Medium Risk (05/24/2022)    Readmission Risk Interventions    06/01/2021   10:05 AM   Readmission Risk Prevention Plan  Transportation Screening Complete  HRI or Home Care Consult Complete  Social Work Consult for Recovery Care Planning/Counseling Complete  Palliative Care Screening Not Applicable  Medication Review [employer]) Complete

## 2022-05-29 NOTE — Progress Notes (Signed)
CARDIAC REHAB PHASE I   PRE:  Rate/Rhythm: 96 SR    BP: sitting 120/71    SpO2: 100 RA  MODE:  Ambulation: 330 ft   POST:  Rate/Rhythm: 123 ST    BP: sitting 133/78     SpO2: 98 RA  Pt nervous to walk and cause stomach pain however agreeable. Tolerated well at slow pace, pt able to walk farther than she planned. No c/o. HR stable. Return to bed.  [id number]   Ethelda Chick BS, ACSM-CEP 05/29/2022 3:15 PM

## 2022-05-29 NOTE — Telephone Encounter (Signed)
Patient Advocate Encounter   Received notification from pt msgs that prior authorization is required for Dexcom G6 transmitter  Submitted: 05/29/22 Key [id number]  Sent for expedited review Status is pending

## 2022-05-30 ENCOUNTER — Encounter (HOSPITAL_COMMUNITY): Payer: Self-pay

## 2022-05-30 ENCOUNTER — Inpatient Hospital Stay (HOSPITAL_COMMUNITY): Payer: Medicare Other

## 2022-05-30 DIAGNOSIS — J9601 Acute respiratory failure with hypoxia: Secondary | ICD-10-CM | POA: Diagnosis not present

## 2022-05-30 HISTORY — PX: IR US GUIDE VASC ACCESS RIGHT: IMG2390

## 2022-05-30 LAB — COOXEMETRY PANEL
Carboxyhemoglobin: 2.3 % — ABNORMAL HIGH (ref 0.5–1.5)
Methemoglobin: 1.2 % (ref 0.0–1.5)
O2 Saturation: 71.5 %
Total hemoglobin: 10.3 g/dL — ABNORMAL LOW (ref 12.0–16.0)

## 2022-05-30 LAB — GLUCOSE, CAPILLARY
Glucose-Capillary: 219 mg/dL — ABNORMAL HIGH (ref 70–99)
Glucose-Capillary: 175 mg/dL — ABNORMAL HIGH (ref 70–99)
Glucose-Capillary: 174 mg/dL — ABNORMAL HIGH (ref 70–99)

## 2022-05-30 LAB — RAPID URINE DRUG SCREEN, HOSP PERFORMED
Amphetamines: NOT DETECTED
Barbiturates: NOT DETECTED
Benzodiazepines: NOT DETECTED
Cocaine: NOT DETECTED
Opiates: NOT DETECTED
Tetrahydrocannabinol: POSITIVE — AB

## 2022-05-30 LAB — PREALBUMIN: Prealbumin: 17 mg/dL — ABNORMAL LOW (ref 18–38)

## 2022-05-30 LAB — LIPID PANEL
Cholesterol: 234 mg/dL — ABNORMAL HIGH (ref 0–200)
HDL: 64 mg/dL (ref >40)
LDL Cholesterol: 150 mg/dL — ABNORMAL HIGH (ref 0–99)
Total CHOL/HDL Ratio: 3.7 RATIO
Triglycerides: 98 mg/dL (ref <150)
VLDL: 20 mg/dL (ref 0–40)

## 2022-05-30 LAB — T4, FREE: Free T4: 1.19 ng/dL — ABNORMAL HIGH (ref 0.61–1.12)

## 2022-05-30 LAB — PROTIME-INR
INR: 1 (ref 0.8–1.2)
Prothrombin Time: 13.7 seconds (ref 11.4–15.2)

## 2022-05-30 LAB — ANTITHROMBIN III: AntiThromb III Func: 91 % (ref 75–120)

## 2022-05-30 LAB — HIV ANTIBODY (ROUTINE TESTING W REFLEX): HIV Screen 4th Generation wRfx: NONREACTIVE

## 2022-05-30 LAB — BASIC METABOLIC PANEL
Anion gap: 7 (ref 5–15)
BUN: 10 mg/dL (ref 6–20)
CO2: 24 mmol/L (ref 22–32)
Calcium: 8.5 mg/dL — ABNORMAL LOW (ref 8.9–10.3)
Chloride: 104 mmol/L (ref 98–111)
Creatinine, Ser: 0.89 mg/dL (ref 0.44–1.00)
GFR, Estimated: >60 mL/min (ref >60)
Glucose, Bld: 122 mg/dL — ABNORMAL HIGH (ref 70–99)
Potassium: 3.9 mmol/L (ref 3.5–5.1)
Sodium: 135 mmol/L (ref 135–145)

## 2022-05-30 LAB — LACTATE DEHYDROGENASE: LDH: 171 U/L (ref 98–192)

## 2022-05-30 LAB — TSH: TSH: 13.002 u[IU]/mL — ABNORMAL HIGH (ref 0.350–4.500)

## 2022-05-30 LAB — HEPATITIS B CORE ANTIBODY, TOTAL: Hep B Core Total Ab: NONREACTIVE

## 2022-05-30 LAB — APTT: aPTT: 27 seconds (ref 24–36)

## 2022-05-30 LAB — HEPATITIS C ANTIBODY: HCV Ab: NONREACTIVE

## 2022-05-30 LAB — HEPATITIS B SURFACE ANTIBODY,QUALITATIVE: Hep B S Ab: NONREACTIVE

## 2022-05-30 LAB — HEPATITIS B SURFACE ANTIGEN: Hepatitis B Surface Ag: NONREACTIVE

## 2022-05-30 LAB — URIC ACID: Uric Acid, Serum: 5.5 mg/dL (ref 2.5–7.1)

## 2022-05-30 MED ORDER — LOSARTAN POTASSIUM 25 MG PO TABS
12.5000 mg | ORAL_TABLET | Freq: Every day | ORAL | Status: DC
  Administered 2022-05-30 – 2022-06-01 (×3): 12.5 mg via ORAL
  Filled 2022-05-30 (×3): qty 1

## 2022-05-30 MED ORDER — HEPARIN (PORCINE) 25000 UT/250ML-% IV SOLN
1000.0000 [IU]/h | INTRAVENOUS | Status: DC
  Administered 2022-05-31: 1000 [IU]/h via INTRAVENOUS
  Filled 2022-05-30: qty 250

## 2022-05-30 MED ORDER — FLEET ENEMA 7-19 GM/118ML RE ENEM: 1.0000 | ENEMA | Freq: Every day | RECTAL | Status: DC | PRN

## 2022-05-30 MED ORDER — IOHEXOL 350 MG/ML SOLN
50.0000 mL | Freq: Once | INTRAVENOUS | Status: AC | PRN
  Administered 2022-05-30: 50 mL via INTRAVENOUS

## 2022-05-30 MED ORDER — LACTULOSE 10 GM/15ML PO SOLN
30.0000 g | Freq: Two times a day (BID) | ORAL | Status: AC
  Administered 2022-05-30: 30 g via ORAL
  Filled 2022-05-30 (×2): qty 45

## 2022-05-30 NOTE — Progress Notes (Signed)
[name], female   DOB: [birth year], 52 y.o.   MRN: [id number]     Advanced Heart Failure Rounding Note  PCP-Cardiologist: Armanda Magic, MD   Subjective:   Failed Dobutamine wean. Stabilized back on Dobutamine.    Yesterday diuretics held.   CO-OX 71.5%.   Denies SOB.    Objective:   Weight Range: 63.8 kg Body mass index is 25.73 kg/m.   Vital Signs:   Temp:  [97.9 F (36.6 C)-98.2 F (36.8 C)] 97.9 F (36.6 C) (05/07 0433) Pulse Rate:  [81-94] 91 (05/07 0433) Resp:  [12-18] 16 (05/07 0433) BP: (103-148)/(56-77) 115/66 (05/07 0433) SpO2:  [95 %-99 %] 95 % (05/07 0433) Last BM Date : 05/26/22  Weight change: Filed Weights   05/24/22 0559 05/25/22 0500 05/26/22 0625  Weight: 64.7 kg 64.8 kg 63.8 kg    Intake/Output:   Intake/Output Summary (Last 24 hours) at 05/30/2022 0728 Last data filed at 05/30/2022 7829 Gross per 24 hour  Intake 497.71 ml  Output --  Net 497.71 ml    CVP 3 Physical Exam  General:  Well appearing. No resp difficulty HEENT: normal Neck: supple. no JVD. Carotids 2+ bilat; no bruits. No lymphadenopathy or thryomegaly appreciated. LIJ Cor: PMI nondisplaced. Regular rate & rhythm. No rubs, gallops or murmurs. Tunneled PICC Lungs: clear Abdomen: soft, nontender, nondistended. No hepatosplenomegaly. No bruits or masses. Good bowel sounds. Extremities: no cyanosis, clubbing, rash, edema Neuro: alert & orientedx3, cranial nerves grossly intact. moves all 4 extremities w/o difficulty. Affect pleasant   Telemetry  SR 80-90s  Labs    CBC Recent Labs    05/28/22 0315 05/29/22 0533  WBC 10.7* 5.7  HGB 11.6* 10.7*  HCT 34.0* 31.8*  MCV 89.2 89.8  PLT 416* 388    Basic Metabolic Panel Recent Labs    56/21/30 0533 05/30/22 0615  NA 134* 135  K 3.3* 3.9  CL 97* 104  CO2 26 24  GLUCOSE 127* 122*  BUN 16 10  CREATININE 1.03* 0.89  CALCIUM 9.0 8.5*   Liver Function Tests No results for input(s): "AST", "ALT",  "ALKPHOS", "BILITOT", "PROT", "ALBUMIN" in the last 72 hours. No results for input(s): "LIPASE", "AMYLASE" in the last 72 hours. Cardiac Enzymes No results for input(s): "CKTOTAL", "CKMB", "CKMBINDEX", "TROPONINI" in the last 72 hours.  BNP: BNP (last 3 results) Recent Labs    11/03/21 1151 05/22/22 1529  BNP 127.9* >4,500.0*    ProBNP (last 3 results) No results for input(s): "PROBNP" in the last 8760 hours.   D-Dimer No results for input(s): "DDIMER" in the last 72 hours. Hemoglobin A1C No results for input(s): "HGBA1C" in the last 72 hours.  Fasting Lipid Panel No results for input(s): "CHOL", "HDL", "LDLCALC", "TRIG", "CHOLHDL", "LDLDIRECT" in the last 72 hours. Thyroid Function Tests No results for input(s): "TSH", "T4TOTAL", "T3FREE", "THYROIDAB" in the last 72 hours.  Invalid input(s): "FREET3"  Other results:   Imaging    IR Fluoro Guide CV Line Right  Result Date: 05/29/2022 INDICATION: 52 year old female with acute congestive heart failure in need of tunneled venous access for continuous dobutamine infusion. EXAM: IR RIGHT FLUORO GUIDE CV LINE MEDICATIONS: None. ANESTHESIA/SEDATION: None. FLUOROSCOPY TIME:  Radiation exposure index: 1 mGy reference air kerma COMPLICATIONS: None immediate. PROCEDURE: Informed written consent was obtained from the patient after a thorough discussion of the procedural risks, benefits and alternatives. All questions were addressed. Maximal Sterile Barrier Technique was utilized including caps, mask, sterile gowns, sterile gloves, sterile drape,  hand hygiene and skin antiseptic. A timeout was performed prior to the initiation of the procedure. The right internal jugular vein was interrogated with ultrasound and found to be widely patent. An image was obtained and stored for the medical record. Local anesthesia was attained by infiltration with 1% lidocaine. A small dermatotomy was made. Under real-time sonographic guidance, the vessel was  punctured with a 21 gauge micropuncture needle. Using standard technique, the initial micro needle was exchanged over a 0.018 micro wire and a peel-away sheath was advanced over the wire and into the right heart. A suitable skin exit site on the anterior chest was selected. Local anesthesia was again attained by infiltration with 1% lidocaine. A small dermatotomy was made. A tunneled dual lumen power line was then cut to 23 cm and tunneled from the skin exit site to the dermatotomy overlying the venous access site. The catheter was then advanced through the peel-away sheath and the peel-away sheath was discarded. The catheter tip is visualized at the superior cavoatrial junction. A fluoroscopic image was saved for the medical record. The catheter flushes and aspirates easily. The catheter was capped, flushed and then secured to the skin with 0 Prolene suture. Sterile bandages were applied. IMPRESSION: Successful placement of dual lumen power injectable tunneled central venous catheter via the right internal jugular vein. Catheter tip is at the cavoatrial junction and ready for immediate use. Electronically Signed   By: Malachy Moan M.D.   On: 05/29/2022 14:48     Medications:     Scheduled Medications:  aspirin EC  81 mg Oral QHS   buPROPion  150 mg Oral BH-q7a   Chlorhexidine Gluconate Cloth  6 each Topical Daily   digoxin  0.125 mg Oral Daily   DULoxetine  30 mg Oral QHS   heparin  5,000 Units Subcutaneous Q8H   insulin pump   Subcutaneous TID WC, HS, 0200   levothyroxine  100 mcg Oral Q0600   metoCLOPramide (REGLAN) injection  10 mg Intravenous Q6H   mometasone-formoterol  2 puff Inhalation BID   montelukast  10 mg Oral QHS   pantoprazole  40 mg Oral BID   polyethylene glycol  17 g Oral Daily   senna-docusate  1 tablet Oral BID   sodium chloride flush  3 mL Intravenous Q12H   spironolactone  25 mg Oral Daily   traZODone  100 mg Oral QHS    Infusions:  DOBUTamine 3 mcg/kg/min  (05/30/22 4098)   promethazine (PHENERGAN) injection (IM or IVPB)      PRN Medications: acetaminophen, dextrose, diclofenac Sodium, diphenhydrAMINE, fluticasone, levalbuterol, ondansetron (ZOFRAN) IV, mouth rinse, polyethylene glycol, promethazine (PHENERGAN) injection (IM or IVPB), sodium chloride flush    Patient Profile   52 y.o.with HFrEF, CAD, s/p CABG in 10/2018 (LIMA to the LAD/ramus intermedius and RIMA to the obtuse marginal), DM Type I,  anemia, anxiety, hypothyroidism, and hyperlipidemia. Admitted w/ DKA. Hospitalization c/b a/c systolic heart failure w/ low output.   Assessment/Plan  1. 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  - Echo 09/2020 EF 20-25%.  - LHC with 09/2020 with severe coronary disease. Total occlusion of the proximal LAD, moderate stenosis of the intermediate branch, and severe ostial circumflex in-stent restenosis.  - Echo 12/27/20  EF 40-45% RV mildly HK - Echo 12/23 EF 45-50%  - Echo this admit EF down 20-25% w/ AK of anteroseptal wall and apex, RV severely reduced. Also w/ Hs trop elevation  peaking at 5,048. LHC w/ stable coronary anatomy and 2/2 patent bypass grafts. Suspect drop in EF likely stress induced in setting of critical illness/DKA - RHC c/w low output, LV dominant shock (mRA 4, PAP 44/18, mPCWP 24, LVEDP 24, FICK CO 3.04, CI 1.85, Co-ox 46%) - Failed dobutamine wean. Stabilized on Dobutamine 3 mcg. CO-OX stable.  -CVP 3. Hold lasix.  - Continue digoxin 0.125 daily.  - - No BB with shock.  - Continue  spiro 25 mg daily.  - Add 12.5 mg losartan dialy.   - not candidate for SGLT2i w/ Type 1DM and DKA  - Now has end-stage HF d/t severe ICM. No options for revascularization. RV reduced on echo but PAPI okay. Starting workup for transplant with option for LVAD as back-up.  Will need blood glucose well-controlled, hgbA1c this admission was 7.8.  As above, think she will be inotrope-dependent.   Will arrange home dobutamine, will need tunneled PICC today.     2. CAD/NSTEMI  - h/o CABG x 2 2020, LIMA-LAD, RIMA-LCx - HS trop this admit peaked to 5048  - LHC w/ stable disease, 2/2 patent grafts. No culprit lesions  - suspect trop elevation 2/2 demand ischemia/acute HF  - No chest pain.  - continue medical management    3. DKA - Type 1 DM, developed after insulin pump malfunctioned  - Hgb A1C  7.8 on 5/1.  - now resolved - per Internal Medicine    4. Acute Hypoxic Respiratory Failure - developed after volume IVF resuscitation  - PCWP 24 and LVEDP 24 on RHC. Diuresed. - Resolved on room air.    5. AKI  - Scr peaked to 1.7, in setting of DKA and low output HF - Resolved   6. Hypokalemia - Resolved.   7. Frequent PVCs - supp K, keep > 4.0  - keep > 2.0   Working on home Dobutamine.  Remove LIJ. Can use tunneled. PICC.    Length of Stay: 9  Jenise Iannelli, NP  05/30/2022, 7:28 AM  Advanced Heart Failure Team Pager [phone] (M-F; 7a - 5p)  Please contact [facility] for night-coverage after hours (5p -7a ) and weekends on [email or web address]

## 2022-05-30 NOTE — TOC Progression Note (Addendum)
Transition of Care [facility]) - Progression Note    Patient Details  Name: Jennifer Hogan MRN: [id number] Date of Birth: August 17, 1970  [facility] CM/SW Contact  Elliot Cousin, RN Phone Number: [phone] 05/30/2022, 1:50 PM  Clinical Narrative:   HF TOC CM received call from [facility] RNLanora Manis # [phone] fax # [phone] and they accepted referral for Home Dobutamine. States she will arrange for their Infusion RN to come to hospital tomorrow for teaching. Once dc date is confirmed they will deliver medication to hospital. Spoke to Delphia Grates RN, [facility] will do PICC care and lab draws. Will need orders for [facility].   Faxed PICC line information to [facility]. Spoke to [place] and they are trying to locate a [facility] to complete teaching, provided her with CM that will follow pt on 5/8. Spoke to pt and gave her an updated on Home Infusion and [facility]. Explained to patient how to set up her Medicaid transportation. She will call number on her card to set up and also has her friend, Mr Josiah Lobo that will assist her with transportation.     Expected Discharge Plan: Home/Self Care Barriers to Discharge: Continued Medical Work up  Expected Discharge Plan and Services   Discharge Planning Services: CM Consult   Living arrangements for the past 2 months: Single Family Home                                       Social Determinants of Health (SDOH) Interventions SDOH Screenings   Food Insecurity: No Food Insecurity (05/26/2022)  Recent Concern: Food Insecurity - Food Insecurity Present (05/15/2022)  Housing: Low Risk  (05/26/2022)  Recent Concern: Housing - High Risk (05/15/2022)  Transportation Needs: No Transportation Needs (05/26/2022)  Recent Concern: Transportation Needs - Unmet Transportation Needs (05/15/2022)  Utilities: Not At Risk (05/26/2022)  Alcohol Screen: Low Risk  (10/05/2020)  Depression  (PHQ2-9): High Risk (02/15/2022)  Financial Resource Strain: High Risk (05/15/2022)  Physical Activity: Unknown (05/15/2022)  Social Connections: Socially Isolated (05/15/2022)  Stress: Stress Concern Present (05/15/2022)  Tobacco Use: Medium Risk (05/30/2022)    Readmission Risk Interventions    06/01/2021   10:05 AM  Readmission Risk Prevention Plan  Transportation Screening Complete  HRI or Home Care Consult Complete  Social Work Consult for Recovery Care Planning/Counseling Complete  Palliative Care Screening Not Applicable  Medication Review Oceanographer) Complete

## 2022-05-30 NOTE — Telephone Encounter (Signed)
Pharmacy Patient Advocate Encounter  Prior Authorization for Dexcom G6 sensor  has been approved   Effective dates: 05/15/22 through 11/25/22

## 2022-05-30 NOTE — Progress Notes (Signed)
MCS EDUCATION NOTE:                VAD evaluation consent reviewed and signed by Ms Krantz.  Initial VAD teaching completed with pt and caregiver.   VAD educational packet including "Understanding Your Options with Advanced Heart Failure", "[place] Patient Agreement for VAD Evaluation and Potential Implantation" consent, and [employer] "Heartmate 3 Left Ventricular Device (LVAD) Patient Guide", Heartmate 3 Left Ventricular Assist System Patient Education Program DVD", "[place] HM III Patient Education", "[place] Mechanical Circulatory Support Program", and "Decision Aids for Left Ventricular Assist Device" reviewed in detail and left at bedside for continued reference.   All questions answered regarding VAD implant, hospital stay, and what to expect when discharged home living with a heart pump. Pt identified Zollie Beckers as her primary caregiver should be deemed appropriate for her.  Explained need for 24/7 care when pt is discharged home due to sternal precautions, adaptation to living on support, emotional support, consistent and meticulous exit site care and management, medication adherence and high volume of follow up visits with the VAD Clinic after discharge; both pt and caregiver verbalized understanding of above.   Explained that LVAD can be implanted for two indications in the setting of advanced left ventricular heart failure treatment:  Bridge to transplant - used for patients who cannot safely wait for heart transplant without this device.  Or    Destination therapy - used for patients until end of life or recovery of heart function.  Patient and caregiver(s) acknowledge that the indication at this point in time for LVAD therapy would be for DT due to [facility] use.   Provided brief equipment overview and demonstration with HeartMate III training loop including discussion on the following:   a) mobile power unit b) system controller   c) universal Magazine features editor   d) battery clips    e) Batteries   f)  Perc lock   g) Percutaneous lead   Demonstrated and discussed:  a) changing power source on system controller from tethered (MPU) to untethered (battery) mode   b) changing power source on system controller from untethered (battery) to tethered (MPU) mode   c) how to monitor battery life both on the system controller and on each individual battery   d) changing batteries   Reviewed and supplied a copy of home inspection check list stressing that only three pronged grounded power outlets can be used for VAD equipment. Ms Mapel confirmed home has electrical outlets that will support the equipment along with access working telephone.  Identified the following lifestyle modifications while living on MCS:    1. No driving for at least three months and then only if doctor gives permission to do so.   2. No tub baths while pump implanted, and shower only when doctor gives permission.   3. No swimming or submersion in water while implanted with pump.   4. No contact sports or engaging in jumping activities.   5. Always have a backup controller, charged spare batteries, and battery clips nearby at all times in case of emergency.   6. Call the doctor or hospital contact person if any change in how the pump sounds, feels, or works.   7. Plan to sleep only when connected to the power module.   8. Do not sleep on your stomach.   9. Keep a backup system controller, charged batteries, battery clips, and flashlight near you during sleep in case of electrical power outage.   10.  Exit site care including dressing changes, monitoring for infection, and importance of keeping percutaneous lead stabilized at all times.     Extended the option to have one of our current patients and caregiver(s) come to talk with them about living on support} to assist with decision making.   Reviewed pictures of VAD drive line, site care, dressing changes, and drive line stabilization including  securement attachment device and abdominal binder. Discussed with pt and family that they will be required to purchase dressing supplies as long as patient has the VAD in place.   She will also need to abide by sternal precautions with no lifting >10lbs, pushing, pulling and will need assistance with adapting to new life style with VAD equipment and care.   Intermacs patient survival statistics through December 2023 reviewed with patient and caregiver as follows:    The patient understands that from this discussion it does not mean that they will receive the device, but that depends on an extensive evaluation process. The patient is aware of the fact that if at anytime they want to stop the evaluation process they can.  All questions have been answered at this time and contact information was provided should they encounter any further questions.  They are both agreeable at this time to the evaluation process and will move forward.   VAD work up beginning today as a backup for transplant should pt decompensate in the next 6 mths. Pt informed today that she will need to completely abstain from any type of marijuana use. Pt informed that transplant team will require monthly drug screens to prove [facility] free for 6 mths. We will follow pt in VAD clinic on d/c to follow pt closely for VAD/transplant.   Carlton Adam, RN VAD Coordinator   Office: [phone] 24/7 VAD Pager: [phone]

## 2022-05-30 NOTE — TOC Progression Note (Addendum)
Transition of Care [facility]) - Progression Note    Patient Details  Name: Jennifer Hogan MRN: [id number] Date of Birth: October 13, 1970  [facility]) CM/SW Contact  Elliot Cousin, RN Phone Number: [phone] 05/30/2022, 8:48 AM  Clinical Narrative: HF TOC CM contacted [facility] to follow up on weekly PICC line care and lab draws for Home IV Dobutamine. Waiting for call back. Contacted [facility] to follow up on prior auth for Home Dobutamine.     Received call back from [facility] rep, Fleet Contras and they are unable to accept pt for Home Dobutamine due to staffing and Medicaid reimbursement.   Contacted [facility] and they accept plan. Faxed referral to [facility] intake # [phone]. Updated attending.  Received call back from [facility] RN, Revonda Standard states she will review referral. Pt will need PICC line care and lab draws once per week for IV Dobutamine.    Expected Discharge Plan: Home/Self Care Barriers to Discharge: Continued Medical Work up  Expected Discharge Plan and Services   Discharge Planning Services: CM Consult   Living arrangements for the past 2 months: Single Family Home                                       Social Determinants of Health (SDOH) Interventions SDOH Screenings   Food Insecurity: No Food Insecurity (05/26/2022)  Recent Concern: Food Insecurity - Food Insecurity Present (05/15/2022)  Housing: Low Risk  (05/26/2022)  Recent Concern: Housing - High Risk (05/15/2022)  Transportation Needs: No Transportation Needs (05/26/2022)  Recent Concern: Transportation Needs - Unmet Transportation Needs (05/15/2022)  Utilities: Not At Risk (05/26/2022)  Alcohol Screen: Low Risk  (10/05/2020)  Depression (PHQ2-9): High Risk (02/15/2022)  Financial Resource Strain: High Risk (05/15/2022)  Physical Activity: Unknown (05/15/2022)  Social Connections: Socially Isolated (05/15/2022)  Stress: Stress Concern Present (05/15/2022)  Tobacco Use:  Medium Risk (05/30/2022)    Readmission Risk Interventions    06/01/2021   10:05 AM  Readmission Risk Prevention Plan  Transportation Screening Complete  HRI or Home Care Consult Complete  Social Work Consult for Recovery Care Planning/Counseling Complete  Palliative Care Screening Not Applicable  Medication Review Oceanographer) Complete

## 2022-05-30 NOTE — Plan of Care (Signed)
Patient was seen this morning.  Awaiting for dobutamine infusion set up outpatient. D/w heart failure team. CHF team (Dr Gasper Lloyd) will assume care.  I will sign off.    Thad Ranger M.D.  [facility] 05/30/2022, 10:43 AM

## 2022-05-30 NOTE — Progress Notes (Signed)
ANTICOAGULATION CONSULT NOTE - Initial Consult  Pharmacy Consult for Heparin Indication: left internal jugular vein thrombosis  Allergies  Allergen Reactions   Atorvastatin Other (See Comments)    Myalgias    Crestor [Rosuvastatin Calcium] Other (See Comments)    Severe myalgias and joint pain   Monosodium Glutamate Nausea And Vomiting and Other (See Comments)    MSG - migraine   Zetia [Ezetimibe] Other (See Comments)    myalgias   Ciprofloxin Hcl [Ciprofloxacin] Nausea And Vomiting and Other (See Comments)    Severe migraine   Food Color Red [Red Dye] Diarrhea    Patient Measurements: Height: 5\' 2"  (157.5 cm) Weight: 63.8 kg (140 lb 10.5 oz) IBW/kg (Calculated) : 50.1  Vital Signs: Temp: 98.8 F (37.1 C) (05/07 1937) Temp Source: Oral (05/07 1937) BP: 104/74 (05/07 1937) Pulse Rate: 95 (05/07 1937)  Labs: Recent Labs    05/28/22 0315 05/29/22 0533 05/30/22 0615 05/30/22 1442  HGB 11.6* 10.7*  --   --   HCT 34.0* 31.8*  --   --   PLT 416* 388  --   --   APTT  --   --   --  27  LABPROT  --   --   --  13.7  INR  --   --   --  1.0  CREATININE 0.99 1.03* 0.89  --     Estimated Creatinine Clearance: 64.2 mL/min (by C-G formula based on SCr of 0.89 mg/dL).   Medical History: Past Medical History:  Diagnosis Date   Allergy    Anemia    Anxiety    Asthma    CAD S/P percutaneous coronary angioplasty 03/28/2017   Cataract    CHF (congestive heart failure) (HCC)    Coronary artery disease    a. s/p PTCA DES x3 in the LAD (ostial DES placed, mid DES placed, distal DES placed) and PTCA/DES x1 to intermediate branch 02/2017. b. ACS 02/08/18 with LCx stent placement.   Depression    Diabetes mellitus    type 1   DKA (diabetic ketoacidoses) 12/31/2017   DKA (diabetic ketoacidosis) (HCC) 01/13/2022   Dyspnea    with exertion and dust, no oxygen   Elevated platelet count    GERD (gastroesophageal reflux disease)    Heart murmur    never caused any problems   High  cholesterol    Hypothyroidism    Ischemic cardiomyopathy    a. EF low-normal with basal inferior akinesis, basal septal hypokinesis by echo 01/2018.   Myocardial infarction [facility]) 2019   Nausea and vomiting 12/04/2010   Non-ST elevation (NSTEMI) myocardial infarction [facility]) 02/08/2018   NSTEMI (non-ST elevated myocardial infarction) (HCC)    Substance abuse (HCC)    marijuana    Medications:  Scheduled:   aspirin EC  81 mg Oral QHS   buPROPion  150 mg Oral BH-q7a   Chlorhexidine Gluconate Cloth  6 each Topical Daily   digoxin  0.125 mg Oral Daily   DULoxetine  30 mg Oral QHS   insulin pump   Subcutaneous TID WC, HS, 0200   lactulose  30 g Oral BID   levothyroxine  100 mcg Oral Q0600   losartan  12.5 mg Oral Daily   metoCLOPramide (REGLAN) injection  10 mg Intravenous Q6H   mometasone-formoterol  2 puff Inhalation BID   montelukast  10 mg Oral QHS   pantoprazole  40 mg Oral BID   polyethylene glycol  17 g Oral Daily   senna-docusate  1 tablet Oral BID   sodium chloride flush  3 mL Intravenous Q12H   spironolactone  25 mg Oral Daily   traZODone  100 mg Oral QHS    Assessment: 52 y.o. female with new L IJ DVT for heparin.  Received heparin 5000 units SQ at 2015  Goal of Therapy:  Heparin level 0.3-0.7 units/ml Monitor platelets by anticoagulation protocol: Yes   Plan:  Star heparin 1000 units/hr Check heparin level in 8 hours.   Eddie Candle 05/30/2022,11:51 PM

## 2022-05-30 NOTE — Progress Notes (Signed)
Initial Nutrition Assessment  DOCUMENTATION CODES:   Non-severe (moderate) malnutrition in context of chronic illness  INTERVENTION:   - MVI with minerals daily  - Ensure Enlive po BID, each supplement provides 350 kcal and 20 grams of protein  - Diet education provided  NUTRITION DIAGNOSIS:   Moderate Malnutrition related to chronic illness (HFrEF, gastroparesis) as evidenced by mild fat depletion, moderate muscle depletion.  GOAL:   Patient will meet greater than or equal to 90% of their needs  MONITOR:   PO intake, Supplement acceptance, Labs, Weight trends  REASON FOR ASSESSMENT:   Consult LVAD Eval  ASSESSMENT:   52 year old female who presented to the ED on 4/27 with N/V and hyperglycemia. PMH of T1DM on insulin pump, gastroparesis, CAD s/p CABG, HFrEF 45%, hypothyroidism, HTN, HLD, GERD, hiatal hernia, anxiety, depression, THC use. Pt admitted with DKA, NSTEMI, AKI.  04/30 - s/p R/LHC showing severe double vessel native CAD w/ 2/2 patent grafts, low output HF (LV dominant), started on inotropic support with DBA 05/04 - DBA stopped, persistent N/V 05/05 - back on DBA  Pt beginning VAD work-up as a backup for transplant should pt decompensate in the next 6 months. Plan for pt to d/c home with DBA infusion (stable at 3 mcg/kg/min).  Spoke with pt at bedside. Pt reports that she has done a complete overhaul of her diet recently. She states that she has drastically decreased red meat consumption and is decreasing butter consumption due to her high cholesterol levels. She also states that she has eliminated EtOH consumption.  Pt shares that she eats smaller, more frequent meals throughout the day. She always tires to pair her carbohydrates with a protein. An example of a small meal may be some fruit (apple, banana, grapes) with an egg. She does not eat white bread unless its the only bread available. She prefers sourdough. If she is having a treat, she usually has 1  popsicle (16 grams of carbs) or a small serving of chocolate ice cream.  Pt shares that she has had issues with N/V due to decreased blood flow to her GI system. Pt shares that she has had a gastric emptying scan completed and that it was normal. Noted gastric emptying scan completed on 12/02/21 with impression being "normal gastric emptying study." Pt thinks that the issue isn't her stomach but rather her intestines and that food just moves through them slowly. RD discussed a lower fiber, lower residue diet with pt. Pt denies any N/V today and reports that it is much improved after going back on DBA. She has been able to eat a normal breakfast this morning (half of a bagel with cream cheese, grapes).  Pt denies recent weight loss. She reports a UBW of 140-150 lbs. She states that her weight has been stable for the last 5 years. Reviewed weight history in chart. Pt with a slow decline in weight that appears to have started in December 2023. Overall, pt with a total weight loss of 5.9 kg since 01/04/22. This is a 8.5% weight loss in under 5 months which is clinically significant for timeframe. Pt meets criteria for moderate malnutrition based on NFPE and weight loss.  Discussed importance of adequate kcal and protein intake in maintaining lean body mass and preventing dry weight loss. Explained increase kcal and protein needs related to CHF. Provided examples on ways to increase caloric density of foods and beverages frequently consumed by the patient. Also provided ideas to promote variety and to incorporate additional  nutrient dense foods into patient's diet. Discussed eating small frequent meals and snacks to assist in increasing overall PO intake.  RD also briefly discussed low sodium diet. Discouraged intake of processed foods and use of salt shaker. Encouraged fresh fruits and vegetables as well as whole grain sources of carbohydrates to maximize fiber intake. RD discussed why it is important for pt to  adhere to diet recommendations, and emphasized the role of fluids, foods to avoid, and importance of weighing self daily. Teach back method used.  Pt shares that plan is to discharge today. Discussed oral nutrition supplement options with pt in order to maximize kcal and protein intake. Discussed lower carbohydrate options as well if pt finds it difficult to keep blood sugars under control with Ensure Plus High Protein.  Admit weight: 64.3 kg Current weight: 63.7 kg  Meal Completion: 50-100%  Medications reviewed and include: insulin pump, IV reglan 10 mg q 6 hours, protonix, miralax, senna, spironolactone, dobutamine drip  Labs reviewed: cholesterol 234, LDL 150, hemoglobin A1C 7.8, TSH and T4 both elevated CBG's: 103-219 x 24 hours  NUTRITION - FOCUSED PHYSICAL EXAM:  Flowsheet Row Most Recent Value  Orbital Region Mild depletion  Upper Arm Region No depletion  Thoracic and Lumbar Region Moderate depletion  Buccal Region Mild depletion  Temple Region Mild depletion  Clavicle Bone Region Mild depletion  Clavicle and Acromion Bone Region Moderate depletion  Scapular Bone Region Mild depletion  Dorsal Hand Mild depletion  Patellar Region Mild depletion  Anterior Thigh Region Moderate depletion  Posterior Calf Region No depletion  Edema (RD Assessment) None  Hair Reviewed  Eyes Reviewed  Mouth Reviewed  Skin Reviewed  Nails Reviewed       Diet Order:   Diet Order             Diet - low sodium heart healthy           Diet Carb Modified Fluid consistency: Thin; Room service appropriate? Yes  Diet effective now                   EDUCATION NEEDS:   Education needs have been addressed  Skin:  Skin Assessment: Reviewed RN Assessment  Last BM:  05/30/22  Height:   Ht Readings from Last 1 Encounters:  05/21/22 5\' 2"  (1.575 m)    Weight:   Wt Readings from Last 1 Encounters:  05/31/22 63.7 kg    BMI:  Body mass index is 25.69 kg/m.  Estimated  Nutritional Needs:   Kcal:  1900-2100  Protein:  90-105 grams  Fluid:  >1.8 L/day    Mertie Clause, MS, RD, LDN Inpatient Clinical Dietitian Please see [facility] for contact information.

## 2022-05-30 NOTE — Progress Notes (Signed)
Mobility Specialist Progress Note:    05/30/22 1700  Mobility  Activity Ambulated with assistance in hallway  Level of Assistance Contact guard assist, steadying assist  Assistive Device None  Distance Ambulated (ft) 480 ft  Activity Response Tolerated well  Mobility Referral Yes  $Mobility charge 1 Mobility  Mobility Specialist Start Time (ACUTE ONLY) 1640  Mobility Specialist Stop Time (ACUTE ONLY) 1700  Mobility Specialist Time Calculation (min) (ACUTE ONLY) 20 min   Pt alert and eager to ambulate. No complaints throughout. Pt back to bed at end of session with call bell at side.    Thompson Grayer Mobility Specialist  Please contact vis Secure Chat or  Rehab Office [phone]

## 2022-05-31 ENCOUNTER — Other Ambulatory Visit: Payer: Self-pay

## 2022-05-31 ENCOUNTER — Other Ambulatory Visit (HOSPITAL_COMMUNITY): Payer: Self-pay

## 2022-05-31 ENCOUNTER — Inpatient Hospital Stay (HOSPITAL_COMMUNITY): Payer: Medicare Other

## 2022-05-31 ENCOUNTER — Telehealth (HOSPITAL_COMMUNITY): Payer: Self-pay | Admitting: Pharmacy Technician

## 2022-05-31 DIAGNOSIS — J9601 Acute respiratory failure with hypoxia: Secondary | ICD-10-CM | POA: Diagnosis not present

## 2022-05-31 DIAGNOSIS — E440 Moderate protein-calorie malnutrition: Secondary | ICD-10-CM | POA: Insufficient documentation

## 2022-05-31 LAB — PULMONARY FUNCTION TEST
DL/VA % pred: 71 %
DL/VA: 3.05 ml/min/mmHg/L
DLCO cor % pred: 70 %
DLCO cor: 15.14 ml/min/mmHg
DLCO unc % pred: 63 %
DLCO unc: 13.72 ml/min/mmHg
FEF 25-75 Pre: 2.06 L/sec
FEF2575-%Pred-Pre: 76 %
FEV1-%Pred-Pre: 93 %
FEV1-Pre: 2.65 L
FEV1FVC-%Pred-Pre: 94 %
FEV6-%Pred-Pre: 99 %
FEV6-Pre: 3.47 L
FEV6FVC-%Pred-Pre: 101 %
FVC-%Pred-Pre: 98 %
FVC-Pre: 3.54 L
Pre FEV1/FVC ratio: 75 %
Pre FEV6/FVC Ratio: 99 %
RV % pred: 104 %
RV: 1.98 L
TLC % pred: 106 %
TLC: 5.53 L

## 2022-05-31 LAB — BASIC METABOLIC PANEL
Anion gap: 12 (ref 5–15)
BUN: 7 mg/dL (ref 6–20)
CO2: 24 mmol/L (ref 22–32)
Calcium: 8.9 mg/dL (ref 8.9–10.3)
Chloride: 100 mmol/L (ref 98–111)
Creatinine, Ser: 0.9 mg/dL (ref 0.44–1.00)
GFR, Estimated: >60 mL/min (ref >60)
Glucose, Bld: 156 mg/dL — ABNORMAL HIGH (ref 70–99)
Potassium: 3.8 mmol/L (ref 3.5–5.1)
Sodium: 136 mmol/L (ref 135–145)

## 2022-05-31 LAB — GLUCOSE, CAPILLARY
Glucose-Capillary: 103 mg/dL — ABNORMAL HIGH (ref 70–99)
Glucose-Capillary: 162 mg/dL — ABNORMAL HIGH (ref 70–99)
Glucose-Capillary: 69 mg/dL — ABNORMAL LOW (ref 70–99)
Glucose-Capillary: 221 mg/dL — ABNORMAL HIGH (ref 70–99)
Glucose-Capillary: 286 mg/dL — ABNORMAL HIGH (ref 70–99)
Glucose-Capillary: 282 mg/dL — ABNORMAL HIGH (ref 70–99)
Glucose-Capillary: 198 mg/dL — ABNORMAL HIGH (ref 70–99)

## 2022-05-31 LAB — COOXEMETRY PANEL
Carboxyhemoglobin: 2 % — ABNORMAL HIGH (ref 0.5–1.5)
Methemoglobin: <0.7 % (ref 0.0–1.5)
O2 Saturation: 75.2 %
Total hemoglobin: 9.8 g/dL — ABNORMAL LOW (ref 12.0–16.0)

## 2022-05-31 LAB — HEPARIN LEVEL (UNFRACTIONATED): Heparin Unfractionated: 0.83 IU/mL — ABNORMAL HIGH (ref 0.30–0.70)

## 2022-05-31 LAB — HEPATITIS B SURFACE ANTIBODY, QUANTITATIVE: Hep B S AB Quant (Post): 5.6 m[IU]/mL — ABNORMAL LOW (ref >9.9)

## 2022-05-31 MED ORDER — LOSARTAN POTASSIUM 25 MG PO TABS
12.5000 mg | ORAL_TABLET | Freq: Every day | ORAL | 6 refills | Status: DC
  Filled 2022-05-31: qty 30, 60d supply, fill #0
  Filled 2022-06-25 – 2022-07-28 (×2): qty 30, 60d supply, fill #1

## 2022-05-31 MED ORDER — ADULT MULTIVITAMIN W/MINERALS CH
1.0000 | ORAL_TABLET | Freq: Every day | ORAL | Status: DC
  Administered 2022-06-01: 1 via ORAL
  Filled 2022-05-31: qty 1

## 2022-05-31 MED ORDER — SPIRONOLACTONE 25 MG PO TABS
25.0000 mg | ORAL_TABLET | Freq: Every day | ORAL | 6 refills | Status: DC
  Filled 2022-05-31: qty 30, 30d supply, fill #0

## 2022-05-31 MED ORDER — APIXABAN 5 MG PO TABS
10.0000 mg | ORAL_TABLET | Freq: Two times a day (BID) | ORAL | 0 refills | Status: DC
  Filled 2022-05-31: qty 14, 4d supply, fill #0

## 2022-05-31 MED ORDER — APIXABAN 5 MG PO TABS
5.0000 mg | ORAL_TABLET | Freq: Two times a day (BID) | ORAL | 6 refills | Status: DC
  Filled 2022-05-31: qty 60, 30d supply, fill #0

## 2022-05-31 MED ORDER — APIXABAN (ELIQUIS) VTE STARTER PACK (10MG AND 5MG)
ORAL_TABLET | ORAL | 0 refills | Status: DC
  Filled 2022-05-31: qty 74, 30d supply, fill #0

## 2022-05-31 MED ORDER — HYDROCORTISONE 1 % EX CREA
TOPICAL_CREAM | Freq: Three times a day (TID) | CUTANEOUS | Status: DC
  Filled 2022-05-31: qty 28

## 2022-05-31 MED ORDER — APIXABAN 5 MG PO TABS
10.0000 mg | ORAL_TABLET | Freq: Two times a day (BID) | ORAL | Status: DC
  Administered 2022-05-31 – 2022-06-01 (×3): 10 mg via ORAL
  Filled 2022-05-31 (×3): qty 2

## 2022-05-31 MED ORDER — ENSURE ENLIVE PO LIQD
237.0000 mL | Freq: Two times a day (BID) | ORAL | Status: DC
  Administered 2022-05-31 – 2022-06-01 (×2): 237 mL via ORAL

## 2022-05-31 MED ORDER — DIGOXIN 125 MCG PO TABS
0.1250 mg | ORAL_TABLET | Freq: Every day | ORAL | 6 refills | Status: DC
  Filled 2022-05-31: qty 30, 30d supply, fill #0
  Filled 2022-06-25: qty 30, 30d supply, fill #1
  Filled 2022-07-28: qty 30, 30d supply, fill #2
  Filled 2022-09-12: qty 30, 30d supply, fill #3
  Filled 2022-10-09: qty 30, 30d supply, fill #4

## 2022-05-31 MED ORDER — DOBUTAMINE-DEXTROSE 4-5 MG/ML-% IV SOLN: 3.0000 ug/kg/min | INTRAVENOUS | 52 refills | Status: DC

## 2022-05-31 MED ORDER — APIXABAN 5 MG PO TABS: 5.0000 mg | ORAL_TABLET | Freq: Two times a day (BID) | ORAL | Status: DC

## 2022-05-31 MED ORDER — DEXTROSE 50 % IV SOLN
INTRAVENOUS | Status: AC
  Administered 2022-05-31: 50 mL
  Filled 2022-05-31: qty 50

## 2022-05-31 NOTE — Discharge Instructions (Signed)
Information on my medicine - ELIQUIS (apixaban)  This medication education was reviewed with me or my healthcare representative as part of my discharge preparation.    Why was Eliquis prescribed for you? Eliquis was prescribed to treat blood clots that may have been found in the veins (deep vein thrombosis) or in your lungs (pulmonary embolism) and to reduce the risk of them occurring again.  What do You need to know about Eliquis ? The starting dose is 10 mg (two 5 mg tablets) taken TWICE daily for the FIRST SEVEN (7) DAYS, then on (enter date)  06/07/22  the dose is reduced to ONE 5 mg tablet taken TWICE daily.  Eliquis may be taken with or without food.   Try to take the dose about the same time in the morning and in the evening. If you have difficulty swallowing the tablet whole please discuss with your pharmacist how to take the medication safely.  Take Eliquis exactly as prescribed and DO NOT stop taking Eliquis without talking to the doctor who prescribed the medication.  Stopping may increase your risk of developing a new blood clot.  Refill your prescription before you run out.  After discharge, you should have regular check-up appointments with your healthcare provider that is prescribing your Eliquis.    What do you do if you miss a dose? If a dose of ELIQUIS is not taken at the scheduled time, take it as soon as possible on the same day and twice-daily administration should be resumed. The dose should not be doubled to make up for a missed dose.  Important Safety Information A possible side effect of Eliquis is bleeding. You should call your healthcare provider right away if you experience any of the following: Bleeding from an injury or your nose that does not stop. Unusual colored urine (red or dark brown) or unusual colored stools (red or black). Unusual bruising for unknown reasons. A serious fall or if you hit your head (even if there is no bleeding).  Some  medicines may interact with Eliquis and might increase your risk of bleeding or clotting while on Eliquis. To help avoid this, consult your healthcare provider or pharmacist prior to using any new prescription or non-prescription medications, including herbals, vitamins, non-steroidal anti-inflammatory drugs (NSAIDs) and supplements.  This website has more information on Eliquis (apixaban): http://www.eliquis.com/eliquis/home

## 2022-05-31 NOTE — TOC Transition Note (Signed)
Transition of Care [facility]) - CM/SW Discharge Note   Patient Details  Name: Jennifer Hogan MRN: [id number] Date of Birth: [birth year]  Transition of Care [facility]) CM/SW Contact:  Harriet Masson, RN Phone Number: 05/31/2022, 12:01 PM   Clinical Narrative:    Patient stable for discharge. Elizabeth with [facility] states they will setup the dobutamine infusion today at 1600. Patient will follow up with [facility].  No other TOC needs.   Final next level of care: Home/Self Care ([facility] and [facility] for dobutamine) Barriers to Discharge: Barriers Resolved   Patient Goals and CMS Choice  Return home    Discharge Placement                   home      Discharge Plan and Services Additional resources added to the After Visit Summary for     Discharge Planning Services: CM Consult                                 Social Determinants of Health (SDOH) Interventions SDOH Screenings   Food Insecurity: No Food Insecurity (05/26/2022)  Recent Concern: Food Insecurity - Food Insecurity Present (05/15/2022)  Housing: Low Risk  (05/26/2022)  Recent Concern: Housing - High Risk (05/15/2022)  Transportation Needs: No Transportation Needs (05/26/2022)  Recent Concern: Transportation Needs - Unmet Transportation Needs (05/15/2022)  Utilities: Not At Risk (05/26/2022)  Alcohol Screen: Low Risk  (10/05/2020)  Depression (PHQ2-9): High Risk (02/15/2022)  Financial Resource Strain: High Risk (05/15/2022)  Physical Activity: Unknown (05/15/2022)  Social Connections: Socially Isolated (05/15/2022)  Stress: Stress Concern Present (05/15/2022)  Tobacco Use: Medium Risk (05/30/2022)     Readmission Risk Interventions    05/31/2022   11:59 AM 06/01/2021   10:05 AM  Readmission Risk Prevention Plan  Transportation Screening Complete Complete  HRI or Home Care Consult  Complete  Social Work Consult for Recovery Care Planning/Counseling  Complete  Palliative Care  Screening  Not Applicable  Medication Review Oceanographer) Complete Complete  PCP or Specialist appointment within 3-5 days of discharge Not Complete   PCP/Specialist Appt Not Complete comments follow up 06/09/22   HRI or Home Care Consult Complete   SW Recovery Care/Counseling Consult Complete   Palliative Care Screening Not Applicable   Skilled Nursing Facility Not Applicable

## 2022-05-31 NOTE — Progress Notes (Signed)
CARDIAC REHAB PHASE I    Stopped by to offer walk, pt is currently receiving dietary education. HF education was provided during my last visit. CRP2 referral sent to [facility] per protocol. However, pt is not appropriate for CRP2 at this time due to home dobutamine needs. Pt would like to participate in program when able. Will return later today for walk as time allows.    Woodroe Chen, RN BSN 05/31/2022 11:52 AM

## 2022-05-31 NOTE — Progress Notes (Signed)
ANTICOAGULATION CONSULT NOTE - Follow Up Consult  Pharmacy Consult for Heparin > apixaban Indication: left internal jugular vein thrombosis  Allergies  Allergen Reactions   Atorvastatin Other (See Comments)    Myalgias    Crestor [Rosuvastatin Calcium] Other (See Comments)    Severe myalgias and joint pain   Monosodium Glutamate Nausea And Vomiting and Other (See Comments)    MSG - migraine   Zetia [Ezetimibe] Other (See Comments)    myalgias   Ciprofloxin Hcl [Ciprofloxacin] Nausea And Vomiting and Other (See Comments)    Severe migraine   Food Color Red [Red Dye] Diarrhea    Patient Measurements: Height: 5\' 2"  (157.5 cm) Weight: 63.7 kg (140 lb 6.9 oz) IBW/kg (Calculated) : 50.1  Vital Signs: Temp: 98.2 F (36.8 C) (05/08 0741) Temp Source: Oral (05/08 0741) BP: 116/71 (05/08 0741) Pulse Rate: 103 (05/08 0919)  Labs: Recent Labs    05/29/22 0533 05/30/22 0615 05/30/22 1442 05/31/22 0500 05/31/22 0800  HGB 10.7*  --   --   --   --   HCT 31.8*  --   --   --   --   PLT 388  --   --   --   --   APTT  --   --  27  --   --   LABPROT  --   --  13.7  --   --   INR  --   --  1.0  --   --   HEPARINUNFRC  --   --   --   --  0.83*  CREATININE 1.03* 0.89  --  0.90  --      Estimated Creatinine Clearance: 63.3 mL/min (by C-G formula based on SCr of 0.9 mg/dL).   Medical History: Past Medical History:  Diagnosis Date   Allergy    Anemia    Anxiety    Asthma    CAD S/P percutaneous coronary angioplasty 03/28/2017   Cataract    CHF (congestive heart failure) (HCC)    Coronary artery disease    a. s/p PTCA DES x3 in the LAD (ostial DES placed, mid DES placed, distal DES placed) and PTCA/DES x1 to intermediate branch 02/2017. b. ACS 02/08/18 with LCx stent placement.   Depression    Diabetes mellitus    type 1   DKA (diabetic ketoacidoses) 12/31/2017   DKA (diabetic ketoacidosis) (HCC) 01/13/2022   Dyspnea    with exertion and dust, no oxygen   Elevated platelet  count    GERD (gastroesophageal reflux disease)    Heart murmur    never caused any problems   High cholesterol    Hypothyroidism    Ischemic cardiomyopathy    a. EF low-normal with basal inferior akinesis, basal septal hypokinesis by echo 01/2018.   Myocardial infarction [facility]) 2019   Nausea and vomiting 12/04/2010   Non-ST elevation (NSTEMI) myocardial infarction [facility]) 02/08/2018   NSTEMI (non-ST elevated myocardial infarction) (HCC)    Substance abuse (HCC)    marijuana    Medications:  Scheduled:   apixaban  10 mg Oral BID   [START ON 06/07/2022] apixaban  5 mg Oral BID   aspirin EC  81 mg Oral QHS   buPROPion  150 mg Oral BH-q7a   Chlorhexidine Gluconate Cloth  6 each Topical Daily   digoxin  0.125 mg Oral Daily   DULoxetine  30 mg Oral QHS   insulin pump   Subcutaneous TID WC, HS, 0200   levothyroxine  100 mcg Oral Q0600   losartan  12.5 mg Oral Daily   metoCLOPramide (REGLAN) injection  10 mg Intravenous Q6H   mometasone-formoterol  2 puff Inhalation BID   montelukast  10 mg Oral QHS   pantoprazole  40 mg Oral BID   polyethylene glycol  17 g Oral Daily   senna-docusate  1 tablet Oral BID   sodium chloride flush  3 mL Intravenous Q12H   spironolactone  25 mg Oral Daily   traZODone  100 mg Oral QHS    Assessment: 52 y.o. female with new L IJ DVT initially started on heparin drip last pm  Heparin drip 1000 uts/hr with heparin level 0.8 slightly > goal, cbc stable and no bleeding noted DC planning > will convert to apixaban  10mg  BID x7 days then 5mg  BID  Goal of Therapy:  Heparin level 0.3-0.7 units/ml Monitor platelets by anticoagulation protocol: Yes   Plan:  Stop heparin drip Apixaban 10mg  BID x7 days then 5mg  BID thereafter    Leota Sauers Pharm.D. CPP, BCPS Clinical Pharmacist [phone] 05/31/2022 10:32 AM

## 2022-05-31 NOTE — Progress Notes (Signed)
Patient to requiring bipap at this time.

## 2022-05-31 NOTE — Plan of Care (Signed)

## 2022-05-31 NOTE — TOC Benefit Eligibility Note (Signed)
Patient Advocate Encounter  Insurance verification completed.    The patient is currently admitted and upon discharge could be taking Eliquis 5 mg.  The current 30 day co-pay is $4.00.   The patient is insured through [employer]   This test claim was processed through [facility]- copay amounts may vary at other pharmacies due to pharmacy/plan contracts, or as the patient moves through the different stages of their insurance plan.  Ashford Clouse, CPHT Pharmacy Patient Advocate Specialist [facility] Patient Advocate Team Direct Number: [phone]  Fax: [phone]       

## 2022-05-31 NOTE — Telephone Encounter (Signed)
Pharmacy Patient Advocate Encounter  Insurance verification completed.    The patient is insured through [employer] [place] [employer]   The patient is currently admitted and ran test claims for the following: Eliquis.  Copays and coinsurance results were relayed to Inpatient clinical team.  

## 2022-05-31 NOTE — Discharge Summary (Signed)
Advanced Heart Failure Team  Discharge Summary   [name] MRN: [id number], DOB/AGE: Jul 01, 1970 52 y.o. Admit date: 05/20/2022 D/C date:     05/31/2022   Primary Discharge Diagnoses:  1. 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 CAD/NSTEMI  3. DKA 4. Acute Hypoxic Respiratory Failure  5. AKI  6. Hypokalemia 7. Frequent PVCs 8. DVT, LIJ  9. Moderate Malnutrition   Hospital Course:   Jennifer Hogan is a 52 y.o.with HFrEF, CAD, s/p CABG in 10/2018 (LIMA to the LAD/ramus intermedius and RIMA to the obtuse marginal), DM Type I,  anemia, anxiety, hypothyroidism, and hyperlipidemia.  Admitted 09/2020 with DKA and NSTEMI.  Hospital course complicated by acute respiratory failure and pulmonary edema. Had ECHO with reduced EF down to 20-25%. LHC showed severe CAD with total occlusion of the proximal LAD, moderate stenosis of the intermediate branch, and severe ostial circumflex in-stent restenosis. Both bypass grafts, RIMA-LCx and LIMA-LAD both widely patent. Diuresed with IV lasix. Discharged on Toprol Xl and spironolactone. Discharged to home on 10/06/2020.    Admitted 4/23 with NSTEMI and lactic acidosis. Echo EF 30-35% Cath with stable anatomy.  No culprit.  Severe 2 vessel obstructive CAD (LAD CTO, Ost LCx 99%, RCA distal 60%) Patent sequential LIMA graft  to the ramus intermediate and distal LAD Patent RIMA to the distal LCx Moderately elevated LVEDP   Admitted 5/23 with DKA.    Readmitted December 23 with DKA and elevated troponin with medical management recommended. At that time EF improved to 45 to 50% with no wall motion abnormalities.   Admitted with DKA and NSTEMI. Hospital course complicated by cardiogenic shock.  DKA in setting of malfunctioned pump.Initially fluid resuscitated. Complicated by fluid overload and HF exacerbation. HS Trop >24,000. Underwent. LHC/RHC  w/ stable coronary anatomy and 2/2 patent bypass grafts, elevated PCWP, cardiac index  1.8, and PA sat 46%. Started on inotropes. Once diuresed attempted inotrope wean but quickly decompensated. Stabilized back on Dobutamine 3 mcg.   Now has end-stage HF d/t severe ICM. No options for revascularization. RV reduced on echo but PAPI okay. Starting workup for transplant with option for possible LVAD.  Tetrahydrocannabinol. on for LVAD as back-up.  Will need blood glucose well-controlled, hgbA1c this admission was 7.8.  As part of LVAD work up CT chest showed LIJ DVT. Placed on eliquis DVT starter kit. Discharging with home dobutamine 3 mcg via tunneled PICC. [facility] providing dobutamine and [facility] will perform weekly blood work and dressing changes.   GDMT at discharge: no BB with shock, no SGLT2i DMI. Placed on spironolactone and losartan.   Follow up next week in the VAD/HF clinic. F/U also set up with Dr Allena Katz at [facility] for possible transplant. Plan for PICC dressing and routine labs at [facility]. Will need weekly CBC, CMET, Mag faxed to HF clinic  See below for detailed problems list. He will contine to be followed closely in the HF clinic.    1. 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  - Echo 09/2020 EF 20-25%.  - LHC with 09/2020 with severe coronary disease. Total occlusion of the proximal LAD, moderate stenosis of the intermediate branch, and severe ostial circumflex in-stent restenosis.  - Echo 12/27/20  EF 40-45% RV mildly HK - Echo 12/23 EF 45-50%  - Echo this admit EF down 20-25% w/ AK of anteroseptal wall and apex, RV severely reduced. Also  w/ Hs trop elevation peaking at 5,048. LHC w/ stable coronary anatomy and 2/2 patent bypass grafts. Suspect drop in EF likely stress induced in setting of critical illness/DKA - RHC c/w low output, LV dominant shock (RA 4, PAP 44/18, PCWP 24, LVEDP 24, FICK CO 3.04, CI 1.85, Co-ox 46%).  - Failed dobutamine wean. Stabilized on Dobutamine 3 mcg. -  Volume status stable. Use lasix as needed.  - Continue digoxin 0.125 daily.   - No BB with shock.  - Continue  spiro 25 mg daily.  - Continue 12.5 mg losartan dialy.   - not candidate for SGLT2i w/ Type 1DM and DKA  - Now has end-stage HF d/t severe ICM. No options for revascularization. RV reduced on echo but PAPI okay. Starting workup for transplant with option for LVAD as back-up.  Will need blood glucose well-controlled, hgbA1c this admission was 7.8.  As above, think she will be inotrope-dependent.   - UDS-  Tetrahydrocannabinol. Will need monthly UDS.    2. CAD/NSTEMI  - h/o CABG x 2 2020, LIMA-LAD, RIMA-LCx - HS trop this admit peaked to 5048  - LHC w/ stable disease, 2/2 patent grafts. No culprit lesions  - suspect trop elevation 2/2 demand ischemia/acute HF  - No chest pain.  - continue medical management    3. DKA - Type 1 DM, developed after insulin pump malfunctioned  - Hgb A1C  7.8 on 5/1.  - now resolved   4. Acute Hypoxic Respiratory Failure - developed after volume IVF resuscitation  - PCWP 24 and LVEDP 24 on RHC. Diuresed. - Resolved on room air.    5. AKI  - Scr peaked to 1.7, in setting of DKA and low output HF - Resolved   6. Hypokalemia - Resolved.    7. Frequent PVCs - supp K, keep > 4.0  - keep > 2.0    8. DVT, LIJ  -Noted on CT of chest. LIJ DVT On heparin drip- switch to eliquis DVT starter kit 10 mg twice daily x7 days followed by 5 mg twice a day. Marland Kitchen    Discharge Vitals: Blood pressure 104/72, pulse 94, temperature 97.8 F (36.6 C), temperature source Oral, resp. rate 17, height 5\' 2"  (1.575 m), weight 63.7 kg, last menstrual period 04/21/2022, SpO2 99 %.  Labs: Lab Results  Component Value Date   WBC 5.7 05/29/2022   HGB 10.7 (L) 05/29/2022   HCT 31.8 (L) 05/29/2022   MCV 89.8 05/29/2022   PLT 388 05/29/2022    Recent Labs  Lab 05/31/22 0500  NA 136  K 3.8  CL 100  CO2 24  BUN 7  CREATININE 0.90  CALCIUM 8.9  GLUCOSE 156*    Lab Results  Component Value Date   CHOL 234 (H) 05/30/2022   HDL 64 05/30/2022   LDLCALC 150 (H) 05/30/2022   TRIG 98 05/30/2022   BNP (last 3 results) Recent Labs    11/03/21 1151 05/22/22 1529  BNP 127.9* >4,500.0*    ProBNP (last 3 results) No results for input(s): "PROBNP" in the last 8760 hours.   Diagnostic Studies/Procedures   DG Orthopantogram  Result Date: 05/30/2022 CLINICAL DATA:  Preoperative exam. High risk surgery. EXAM: ORTHOPANTOGRAM/PANORAMIC COMPARISON:  None Available. FINDINGS: There are no periapical lucencies. No evident dental caries. Multiple dental fillings. IMPRESSION: No dental caries or periapical lucencies. Multiple dental fillings. Electronically Signed   By: Narda Rutherford M.D.   On: 05/30/2022 22:41   CT CHEST W CONTRAST  Result  Date: 05/30/2022 CLINICAL DATA:  Metastatic disease evaluation. LVAD evaluation. EXAM: CT CHEST WITH CONTRAST TECHNIQUE: Multidetector CT imaging of the chest was performed during intravenous contrast administration. RADIATION DOSE REDUCTION: This exam was performed according to the departmental dose-optimization program which includes automated exposure control, adjustment of the mA and/or kV according to patient size and/or use of iterative reconstruction technique. CONTRAST:  50mL OMNIPAQUE IOHEXOL 350 MG/ML SOLN COMPARISON:  Chest CT 09/30/2020 FINDINGS: Cardiovascular: There are intraluminal filling defects within the left internal jugular vein, series 3 images 2 through 12. Right internal jugular dialysis catheter tip in the lower SVC. Prior median sternotomy. The heart is normal in size. The thoracic aorta is normal in caliber. Calcifications /stents of the coronary arteries. Mild anterior pericardial thickening but no significant pericardial fluid. There is no evidence of central pulmonary embolus on this exam not tailored to pulmonary arteries assessment. Mediastinum/Nodes: Nonspecific wall thickening of the distal  esophagus. No enlarged mediastinal, hilar, or axillary lymph nodes. No thyroid nodule. Lungs/Pleura: Minimal subsegmental bandlike opacities in the lower lobes, atelectasis or scarring. No pneumonia or focal airspace disease. No pulmonary nodule. No pleural effusion. The trachea and central airways are clear. Upper Abdomen: Moderate stool in the included colon. No acute upper abdominal findings. There is retained food in the stomach. Musculoskeletal: No focal bone lesion. Prior median sternotomy. Thoracic spondylosis with spurring. IMPRESSION: 1. Filling defects within the left internal jugular vein consistent with DVT. 2. Nonspecific wall thickening of the distal esophagus, can be seen with reflux or esophagitis. Recommend endoscopy if there is clinical concern for esophageal mass. 3. No evidence of metastatic disease in the thorax. These results will be called to the ordering clinician or representative by the Radiologist Assistant, and communication documented in the PACS or [employer]. Electronically Signed   By: Narda Rutherford M.D.   On: 05/30/2022 22:40    RHC/LHC 05/23/22  Prox LAD to Mid LAD lesion is 100% stenosed.   Dist LAD-1 lesion is 70% stenosed.   Dist LAD-2 lesion is 70% stenosed.   Mid LAD lesion is 60% stenosed.   Ost Cx to Prox Cx lesion is 99% stenosed.   Prox Cx to Mid Cx lesion is 70% stenosed.   Ramus-1 lesion is 60% stenosed.   Ramus-2 lesion is 20% stenosed.   Ramus-3 lesion is 70% stenosed.   RPAV lesion is 60% stenosed.   Non-stenotic Mid LAD to Dist LAD lesion was previously treated.   LIMA and is normal in caliber.   RIMA and is normal in caliber.   The graft exhibits no disease.  Severe double vessel CAD Chronic occlusion of ostial LAD stent. The proximal, mid and distal LAD and Diagonal branches fill from the LIMA graft. The ostial Circumflex stent has severe restenosis, unchanged from last cath. Patent RIMA graft that fills the Circumflex Large dominant RCA  with mild non-obstructive disease.  PCWP 24 mmHg, LVEDP 24 mmHg. CI 1.8 PA sat 46$.    Echo 05/22/22   1. Only limited images obtained due to patient not cooperating; akinesis  of the anteroseptal wall and apex with overall severe LV dysfunction;  consider stress induced cardiomyopathy.   2. Left ventricular ejection fraction, by estimation, is 20 to 25%. The  left ventricle has severely decreased function. The left ventricle  demonstrates regional wall motion abnormalities (see scoring  diagram/findings for description).   3. Right ventricular systolic function is severely reduced. The right  ventricular size is normal.   4. Left atrial  size was mildly dilated.   5. The mitral valve is normal in structure. Mild mitral valve  regurgitation.   6. The aortic valve is tricuspid. Aortic valve regurgitation is not  visualized. No aortic stenosis is present.    Discharge Medications   Allergies as of 05/31/2022       Reactions   Atorvastatin Other (See Comments)   Myalgias    Crestor [rosuvastatin Calcium] Other (See Comments)   Severe myalgias and joint pain   Monosodium Glutamate Nausea And Vomiting, Other (See Comments)   MSG - migraine   Zetia [ezetimibe] Other (See Comments)   myalgias   Ciprofloxin Hcl [ciprofloxacin] Nausea And Vomiting, Other (See Comments)   Severe migraine   Food Color Red [red Dye] Diarrhea        Medication List     STOP taking these medications    Citrucel oral powder Generic drug: methylcellulose   clopidogrel 75 MG tablet Commonly known as: PLAVIX   meloxicam 15 MG tablet Commonly known as: MOBIC   metoprolol succinate 25 MG 24 hr tablet Commonly known as: TOPROL-XL   omeprazole 20 MG tablet Commonly known as: PRILOSEC OTC       TAKE these medications    acetaminophen 325 MG tablet Commonly known as: TYLENOL Take 650 mg by mouth daily as needed for mild pain, fever or headache.   apixaban 5 MG Tabs tablet Commonly known as:  ELIQUIS Take 2 tablets (10 mg total) by mouth 2 (two) times daily.   apixaban 5 MG Tabs tablet Commonly known as: ELIQUIS Take 1 tablet (5 mg total) by mouth 2 (two) times daily. Start taking on: Jun 07, 2022   aspirin EC 81 MG tablet Take 1 tablet (81 mg total) by mouth daily. What changed: when to take this   b complex vitamins tablet Take 1 tablet by mouth 3 (three) times a week.   Baqsimi One Pack 3 MG/DOSE Powd Generic drug: Glucagon Place 3 mg into the nose once as needed for up to 1 dose.   budesonide 0.25 MG/2ML nebulizer solution Commonly known as: PULMICORT Take 0.25 mg by nebulization daily as needed (shortness of breath/wheezing).   buPROPion 150 MG 24 hr tablet Commonly known as: Wellbutrin XL Take 1 tablet (150 mg total) by mouth every morning.   Dexcom G6 Transmitter Misc Use as instructed to check glucose levels. Change every 90 days.   Dexcom G7 Sensor Misc Apply 1 sensor every 10 days What changed: Another medication with the same name was added. Make sure you understand how and when to take each.   Dexcom G6 Sensor Misc Use as instructed to check glucose levels. Change every 10 days. What changed: You were already taking a medication with the same name, and this prescription was added. Make sure you understand how and when to take each.   diclofenac Sodium 1 % Gel Commonly known as: VOLTAREN Apply 2-4 g topically 3 (three) times daily as needed (knee pain).   digoxin 0.125 MG tablet Commonly known as: LANOXIN Take 1 tablet (0.125 mg total) by mouth daily.   diphenhydrAMINE 25 MG tablet Commonly known as: BENADRYL Take 37.5 mg by mouth at bedtime.   DOBUTamine 4-5 MG/ML-% infusion Commonly known as: DOBUTREX Inject 191.4 mcg/min into the vein continuous. Per [facility] home infusion   Dulera 100-5 MCG/ACT Aero Generic drug: mometasone-formoterol Inhale 2 puffs into the lungs 2 (two) times daily.   DULoxetine 30 MG capsule Commonly known as:  Cymbalta Take 1  capsule (30 mg total) by mouth at bedtime.   fluticasone 50 MCG/ACT nasal spray Commonly known as: FLONASE Insert 1 spray each nostril daily for 3 days then use 2-3 times a week as needed What changed:  how much to take how to take this when to take this   furosemide 20 MG tablet Commonly known as: LASIX Take 1 tablet (20 mg total) by mouth daily as needed (for weight gain).   HumaLOG 100 UNIT/ML injection Generic drug: insulin lispro Inject 0.35 mLs (35 Units total) into the skin daily via pump.   levothyroxine 100 MCG tablet Commonly known as: SYNTHROID Take 1 tablet (100 mcg total) by mouth daily before breakfast.   losartan 25 MG tablet Commonly known as: COZAAR Take 0.5 tablets (12.5 mg total) by mouth daily.   metoCLOPramide 5 MG tablet Commonly known as: Reglan Take 1 tablet (5 mg total) by mouth every 8 (eight) hours as needed for nausea or vomiting.   montelukast 10 MG tablet Commonly known as: SINGULAIR Take 1 tablet (10 mg total) by mouth at bedtime.   multivitamin with minerals Tabs tablet Take 1 tablet by mouth daily.   nitroGLYCERIN 0.4 MG SL tablet Commonly known as: NITROSTAT Place 1 tablet (0.4 mg total) under the tongue every 5 (five) minutes as needed for chest pain.   Omnipod 5 G6 Pods (Gen 5) Misc Use every 3 (three) days.   Omnipod 5 G6 Intro (Gen 5) Kit Use as directed.   ondansetron 4 MG tablet Commonly known as: Zofran Take 1 tablet (4 mg total) by mouth daily as needed for nausea or vomiting.   pantoprazole 40 MG tablet Commonly known as: PROTONIX Take 1 tablet (40 mg total) by mouth 2 (two) times daily.   Praluent 150 MG/ML Soaj Generic drug: Alirocumab Inject 1 pen  into the skin every 14 (fourteen) days.   spironolactone 25 MG tablet Commonly known as: ALDACTONE Take 1 tablet (25 mg total) by mouth daily.   Ventolin HFA 108 (90 Base) MCG/ACT inhaler Generic drug: albuterol INHALE 2 PUFFS INTO THE LUNGS  EVERY 6 (SIX) HOURS AS NEEDED FOR WHEEZING OR SHORTNESS OF BREATH.               Durable Medical Equipment  (From admission, onward)           Start     Ordered   05/29/22 0955  Heart failure home health orders  (Heart failure home health orders / Face to face)  Once       Comments: [facility] to provide  Labs every other week to include BMET, Mg, and CBC with Diff. Additional as needed. Should be drawn via PERIPHERAL stick. NOT PICC line.   J1250 Dobutamine 3 mcg/kg/min X 52 weeks A4221 Supplies for maintenance of drug infusion catheter A4222 Supplies for the external drug infusion per cassette or bag E0781 Ambulatory Infusion pump  Question Answer Comment  Heart Failure Follow-up Care [facility] ([facility]F) [facility] at [phone]   Obtain the following labs Other see comments   Lab frequency Other see comments   Fax lab results to AHF Clinic at [phone]   Diet Low Sodium Heart Healthy   Fluid restrictions: 1800 mL Fluid      05/29/22 0958            Disposition   The patient will be discharged in stable condition to home with Dobutamine infusing.  Discharge Instructions     (HEART FAILURE PATIENTS) Call MD:  Nadyne Coombes  you have any of the following symptoms: 1) 3 pound weight gain in 24 hours or 5 pounds in 1 week 2) shortness of breath, with or without a dry hacking cough 3) swelling in the hands, feet or stomach 4) if you have to sleep on extra pillows at night in order to breathe.   Complete by: As directed    Amb Referral to Cardiac Rehabilitation   Complete by: As directed    Diagnosis: NSTEMI   After initial evaluation and assessments completed: Virtual Based Care may be provided alone or in conjunction with Phase 2 Cardiac Rehab based on patient barriers.: Yes   Intensive Cardiac Rehabilitation (ICR) [facility] location only OR Traditional Cardiac Rehabilitation (TCR) *If criteria for ICR are not met will enroll in TCR [facility] only): Yes   Diet - low sodium heart  healthy   Complete by: As directed    Heart Failure patients record your daily weight using the same scale at the same time of day   Complete by: As directed    Increase activity slowly   Complete by: As directed        Follow-up Information      [facility] Follow up.   Specialty: Cardiology Why: at 10:30 Contact information: [address] [id number] [place] [address] [phone]        [facility] Follow up.   Why: You will be coming to [facility] on 1st floor Contact information: [address] [id number] [place] [address] [phone]        [facility] Follow up.   Why: will provider Dobutamine medication for home Contact information: [phone]                  Duration of Discharge Encounter: Greater than 35 minutes   Signed, Biruk Troia NP-C  05/31/2022, 11:08 AM

## 2022-05-31 NOTE — Progress Notes (Signed)
[name], female   DOB: 08/16/1970, 52 y.o.   MRN: [id number]     Advanced Heart Failure Rounding Note  PCP-Cardiologist: Armanda Magic, MD   Subjective:   Failed Dobutamine wean. Stabilized back on Dobutamine.    Last night started on heparin drip for LIJ DVT.   Denies SOB.   Objective:   Weight Range: 63.8 kg Body mass index is 25.73 kg/m.   Vital Signs:   Temp:  [97.8 F (36.6 C)-98.8 F (37.1 C)] 98.2 F (36.8 C) (05/08 0741) Pulse Rate:  [90-98] 92 (05/08 0741) Resp:  [13-20] 13 (05/08 0741) BP: (95-116)/(55-74) 116/71 (05/08 0741) SpO2:  [95 %-100 %] 96 % (05/08 0741) Last BM Date : 05/30/22  Weight change: Filed Weights   05/24/22 0559 05/25/22 0500 05/26/22 0625  Weight: 64.7 kg 64.8 kg 63.8 kg    Intake/Output:   Intake/Output Summary (Last 24 hours) at 05/31/2022 0756 Last data filed at 05/31/2022 0300 Gross per 24 hour  Intake 383.13 ml  Output --  Net 383.13 ml    CVP 2-3  Physical Exam  General:   No resp difficulty HEENT: normal Neck: supple. no JVD. Carotids 2+ bilat; no bruits. No lymphadenopathy or thryomegaly appreciated. Cor: PMI nondisplaced. Regular rate & rhythm. No rubs, gallops or murmurs. Tunneled double lumen PICC.  Lungs: clear Abdomen: soft, nontender, nondistended. No hepatosplenomegaly. No bruits or masses. Good bowel sounds. Extremities: no cyanosis, clubbing, rash, edema Neuro: alert & orientedx3, cranial nerves grossly intact. moves all 4 extremities w/o difficulty. Affect pleasant   Telemetry  SR 80-90s personally checked.   Labs    CBC Recent Labs    05/29/22 0533  WBC 5.7  HGB 10.7*  HCT 31.8*  MCV 89.8  PLT 388    Basic Metabolic Panel Recent Labs    78/29/56 0615 05/31/22 0500  NA 135 136  K 3.9 3.8  CL 104 100  CO2 24 24  GLUCOSE 122* 156*  BUN 10 7  CREATININE 0.89 0.90  CALCIUM 8.5* 8.9   Liver Function Tests No results for input(s): "AST", "ALT", "ALKPHOS", "BILITOT",  "PROT", "ALBUMIN" in the last 72 hours. No results for input(s): "LIPASE", "AMYLASE" in the last 72 hours. Cardiac Enzymes No results for input(s): "CKTOTAL", "CKMB", "CKMBINDEX", "TROPONINI" in the last 72 hours.  BNP: BNP (last 3 results) Recent Labs    11/03/21 1151 05/22/22 1529  BNP 127.9* >4,500.0*    ProBNP (last 3 results) No results for input(s): "PROBNP" in the last 8760 hours.   D-Dimer No results for input(s): "DDIMER" in the last 72 hours. Hemoglobin A1C No results for input(s): "HGBA1C" in the last 72 hours.  Fasting Lipid Panel Recent Labs    05/30/22 1442  CHOL 234*  HDL 64  LDLCALC 150*  TRIG 98  CHOLHDL 3.7   Thyroid Function Tests Recent Labs    05/30/22 1442  TSH 13.002*    Other results:   Imaging    DG Orthopantogram  Result Date: 05/30/2022 CLINICAL DATA:  Preoperative exam. High risk surgery. EXAM: ORTHOPANTOGRAM/PANORAMIC COMPARISON:  None Available. FINDINGS: There are no periapical lucencies. No evident dental caries. Multiple dental fillings. IMPRESSION: No dental caries or periapical lucencies. Multiple dental fillings. Electronically Signed   By: Narda Rutherford M.D.   On: 05/30/2022 22:41   CT CHEST W CONTRAST  Result Date: 05/30/2022 CLINICAL DATA:  Metastatic disease evaluation. LVAD evaluation. EXAM: CT CHEST WITH CONTRAST TECHNIQUE: Multidetector CT imaging of the chest was performed  during intravenous contrast administration. RADIATION DOSE REDUCTION: This exam was performed according to the departmental dose-optimization program which includes automated exposure control, adjustment of the mA and/or kV according to patient size and/or use of iterative reconstruction technique. CONTRAST:  50mL OMNIPAQUE IOHEXOL 350 MG/ML SOLN COMPARISON:  Chest CT 09/30/2020 FINDINGS: Cardiovascular: There are intraluminal filling defects within the left internal jugular vein, series 3 images 2 through 12. Right internal jugular dialysis catheter  tip in the lower SVC. Prior median sternotomy. The heart is normal in size. The thoracic aorta is normal in caliber. Calcifications /stents of the coronary arteries. Mild anterior pericardial thickening but no significant pericardial fluid. There is no evidence of central pulmonary embolus on this exam not tailored to pulmonary arteries assessment. Mediastinum/Nodes: Nonspecific wall thickening of the distal esophagus. No enlarged mediastinal, hilar, or axillary lymph nodes. No thyroid nodule. Lungs/Pleura: Minimal subsegmental bandlike opacities in the lower lobes, atelectasis or scarring. No pneumonia or focal airspace disease. No pulmonary nodule. No pleural effusion. The trachea and central airways are clear. Upper Abdomen: Moderate stool in the included colon. No acute upper abdominal findings. There is retained food in the stomach. Musculoskeletal: No focal bone lesion. Prior median sternotomy. Thoracic spondylosis with spurring. IMPRESSION: 1. Filling defects within the left internal jugular vein consistent with DVT. 2. Nonspecific wall thickening of the distal esophagus, can be seen with reflux or esophagitis. Recommend endoscopy if there is clinical concern for esophageal mass. 3. No evidence of metastatic disease in the thorax. These results will be called to the ordering clinician or representative by the Radiologist Assistant, and communication documented in the PACS or [employer]. Electronically Signed   By: Narda Rutherford M.D.   On: 05/30/2022 22:40     Medications:     Scheduled Medications:  aspirin EC  81 mg Oral QHS   buPROPion  150 mg Oral BH-q7a   Chlorhexidine Gluconate Cloth  6 each Topical Daily   digoxin  0.125 mg Oral Daily   DULoxetine  30 mg Oral QHS   insulin pump   Subcutaneous TID WC, HS, 0200   lactulose  30 g Oral BID   levothyroxine  100 mcg Oral Q0600   losartan  12.5 mg Oral Daily   metoCLOPramide (REGLAN) injection  10 mg Intravenous Q6H    mometasone-formoterol  2 puff Inhalation BID   montelukast  10 mg Oral QHS   pantoprazole  40 mg Oral BID   polyethylene glycol  17 g Oral Daily   senna-docusate  1 tablet Oral BID   sodium chloride flush  3 mL Intravenous Q12H   spironolactone  25 mg Oral Daily   traZODone  100 mg Oral QHS    Infusions:  DOBUTamine 3 mcg/kg/min (05/31/22 0505)   heparin 1,000 Units/hr (05/31/22 0300)   promethazine (PHENERGAN) injection (IM or IVPB)      PRN Medications: acetaminophen, dextrose, diclofenac Sodium, diphenhydrAMINE, fluticasone, levalbuterol, ondansetron (ZOFRAN) IV, mouth rinse, polyethylene glycol, promethazine (PHENERGAN) injection (IM or IVPB), sodium chloride flush, sodium phosphate    Patient Profile   52 y.o.with HFrEF, CAD, s/p CABG in 10/2018 (LIMA to the LAD/ramus intermedius and RIMA to the obtuse marginal), DM Type I,  anemia, anxiety, hypothyroidism, and hyperlipidemia. Admitted w/ DKA. Hospitalization c/b a/c systolic heart failure w/ low output.   Assessment/Plan  1. 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  - Echo 09/2020 EF 20-25%.  - LHC with 09/2020 with  severe coronary disease. Total occlusion of the proximal LAD, moderate stenosis of the intermediate branch, and severe ostial circumflex in-stent restenosis.  - Echo 12/27/20  EF 40-45% RV mildly HK - Echo 12/23 EF 45-50%  - Echo this admit EF down 20-25% w/ AK of anteroseptal wall and apex, RV severely reduced. Also w/ Hs trop elevation peaking at 5,048. LHC w/ stable coronary anatomy and 2/2 patent bypass grafts. Suspect drop in EF likely stress induced in setting of critical illness/DKA - RHC c/w low output, LV dominant shock (mRA 4, PAP 44/18, mPCWP 24, LVEDP 24, FICK CO 3.04, CI 1.85, Co-ox 46%) - Failed dobutamine wean. Stabilized on Dobutamine 3 mcg. CO-OX stable.  - Volume status stable.  - Continue digoxin 0.125 daily.  - - No BB with shock.  -  Continue  spiro 25 mg daily.  - Continue 12.5 mg losartan dialy.   - not candidate for SGLT2i w/ Type 1DM and DKA  - Now has end-stage HF d/t severe ICM. No options for revascularization. RV reduced on echo but PAPI okay. Starting workup for transplant with option for LVAD as back-up.  Will need blood glucose well-controlled, hgbA1c this admission was 7.8.  As above, think she will be inotrope-dependent.   - UDS-  Tetrahydrocannabinol. Will need monthly UDS.   2. CAD/NSTEMI  - h/o CABG x 2 2020, LIMA-LAD, RIMA-LCx - HS trop this admit peaked to 5048  - LHC w/ stable disease, 2/2 patent grafts. No culprit lesions  - suspect trop elevation 2/2 demand ischemia/acute HF  - No chest pain.  - continue medical management    3. DKA - Type 1 DM, developed after insulin pump malfunctioned  - Hgb A1C  7.8 on 5/1.  - now resolved   4. Acute Hypoxic Respiratory Failure - developed after volume IVF resuscitation  - PCWP 24 and LVEDP 24 on RHC. Diuresed. - Resolved on room air.    5. AKI  - Scr peaked to 1.7, in setting of DKA and low output HF - Resolved   6. Hypokalemia - Resolved.   7. Frequent PVCs - supp K, keep > 4.0  - keep > 2.0   8. DVT, LIJ  Noted on CT of chest. LIJ DVT On heparin drip- switch to eliquis.   Discussed with IR. Continue double lumen for now. Consider exchange to single lumen as outpatient if needed.    Home with [facility] providing Dobutamine. Dressing changes/labs at Infusion Center at [facility]. Will need weekly CBC, CMET, Mag faxed to HF clinic.   HF TOC CM appreciated. [facility] RN, Lanora Manis # [phone] fax # [phone] and for Home Dobutamine.   Length of Stay: 10  Tonye Becket, NP  05/31/2022, 7:56 AM  Advanced Heart Failure Team Pager [phone] (M-F; 7a - 5p)  Please contact [facility] for night-coverage after hours (5p -7a ) and weekends on [email or web address]

## 2022-05-31 NOTE — Plan of Care (Signed)
  Problem: Education: Goal: Knowledge of General Education information will improve Description: Including pain rating scale, medication(s)/side effects and non-pharmacologic comfort measures Outcome: Progressing   Problem: Health Behavior/Discharge Planning: Goal: Ability to manage health-related needs will improve Outcome: Progressing   Problem: Clinical Measurements: Goal: Respiratory complications will improve Outcome: Progressing   Problem: Pain Managment: Goal: General experience of comfort will improve Outcome: Progressing   Problem: Activity: Goal: Capacity to carry out activities will improve Outcome: Progressing   Problem: Cardiac: Goal: Ability to achieve and maintain adequate cardiopulmonary perfusion will improve Outcome: Not Progressing

## 2022-05-31 NOTE — TOC Progression Note (Signed)
Transition of Care [facility]) - Progression Note    Patient Details  Name: Jennifer Hogan MRN: [id number] Date of Birth: 13-Nov-1970  [facility] CM/SW Contact  Harriet Masson, RN Phone Number: 05/31/2022, 4:25 PM  Clinical Narrative:    Was just notified by Lanora Manis with [facility] that the Dobutamine deliver has been delayed to 1800 and they don't have a nurse that can come setup the infusion that late therefore discharge needs to be delayed til 06/01/22. MD notified.    Expected Discharge Plan: Home/Self Care Barriers to Discharge: Other (must enter comment) ([facility] had a delay in infusion delivery therefore dc needs to be delayed until 5/9)  Expected Discharge Plan and Services   Discharge Planning Services: CM Consult   Living arrangements for the past 2 months: Single Family Home Expected Discharge Date: 05/31/22                                     Social Determinants of Health (SDOH) Interventions SDOH Screenings   Food Insecurity: No Food Insecurity (05/26/2022)  Recent Concern: Food Insecurity - Food Insecurity Present (05/15/2022)  Housing: Low Risk  (05/26/2022)  Recent Concern: Housing - High Risk (05/15/2022)  Transportation Needs: No Transportation Needs (05/26/2022)  Recent Concern: Transportation Needs - Unmet Transportation Needs (05/15/2022)  Utilities: Not At Risk (05/26/2022)  Alcohol Screen: Low Risk  (10/05/2020)  Depression (PHQ2-9): High Risk (02/15/2022)  Financial Resource Strain: High Risk (05/15/2022)  Physical Activity: Unknown (05/15/2022)  Social Connections: Socially Isolated (05/15/2022)  Stress: Stress Concern Present (05/15/2022)  Tobacco Use: Medium Risk (05/30/2022)    Readmission Risk Interventions    05/31/2022   11:59 AM 06/01/2021   10:05 AM  Readmission Risk Prevention Plan  Transportation Screening Complete Complete  HRI or Home Care Consult  Complete  Social Work Consult for Recovery Care Planning/Counseling   Complete  Palliative Care Screening  Not Applicable  Medication Review Oceanographer) Complete Complete  PCP or Specialist appointment within 3-5 days of discharge Not Complete   PCP/Specialist Appt Not Complete comments follow up 06/09/22   HRI or Home Care Consult Complete   SW Recovery Care/Counseling Consult Complete   Palliative Care Screening Not Applicable   Skilled Nursing Facility Not Applicable

## 2022-06-01 ENCOUNTER — Other Ambulatory Visit (HOSPITAL_COMMUNITY): Payer: Self-pay | Admitting: *Deleted

## 2022-06-01 ENCOUNTER — Other Ambulatory Visit: Payer: Self-pay

## 2022-06-01 ENCOUNTER — Encounter (HOSPITAL_COMMUNITY): Payer: Medicaid Other

## 2022-06-01 DIAGNOSIS — J9601 Acute respiratory failure with hypoxia: Secondary | ICD-10-CM | POA: Diagnosis not present

## 2022-06-01 DIAGNOSIS — I255 Ischemic cardiomyopathy: Secondary | ICD-10-CM

## 2022-06-01 LAB — GLUCOSE, CAPILLARY
Glucose-Capillary: 313 mg/dL — ABNORMAL HIGH (ref 70–99)
Glucose-Capillary: 256 mg/dL — ABNORMAL HIGH (ref 70–99)
Glucose-Capillary: 336 mg/dL — ABNORMAL HIGH (ref 70–99)
Glucose-Capillary: 269 mg/dL — ABNORMAL HIGH (ref 70–99)

## 2022-06-01 LAB — CBC
HCT: 28.9 % — ABNORMAL LOW (ref 36.0–46.0)
Hemoglobin: 9.7 g/dL — ABNORMAL LOW (ref 12.0–15.0)
MCH: 30.5 pg (ref 26.0–34.0)
MCHC: 33.6 g/dL (ref 30.0–36.0)
MCV: 90.9 fL (ref 80.0–100.0)
Platelets: 369 10*3/uL (ref 150–400)
RBC: 3.18 MIL/uL — ABNORMAL LOW (ref 3.87–5.11)
RDW: 14.7 % (ref 11.5–15.5)
WBC: 6.5 10*3/uL (ref 4.0–10.5)
nRBC: 0 % (ref 0.0–0.2)

## 2022-06-01 LAB — COOXEMETRY PANEL
Carboxyhemoglobin: 2.9 % — ABNORMAL HIGH (ref 0.5–1.5)
Methemoglobin: 0.8 % (ref 0.0–1.5)
O2 Saturation: 71.4 %
Total hemoglobin: 9.9 g/dL — ABNORMAL LOW (ref 12.0–16.0)

## 2022-06-01 LAB — BASIC METABOLIC PANEL
Anion gap: 8 (ref 5–15)
BUN: 9 mg/dL (ref 6–20)
CO2: 24 mmol/L (ref 22–32)
Calcium: 8.6 mg/dL — ABNORMAL LOW (ref 8.9–10.3)
Chloride: 100 mmol/L (ref 98–111)
Creatinine, Ser: 0.95 mg/dL (ref 0.44–1.00)
GFR, Estimated: >60 mL/min (ref >60)
Glucose, Bld: 315 mg/dL — ABNORMAL HIGH (ref 70–99)
Potassium: 4.5 mmol/L (ref 3.5–5.1)
Sodium: 132 mmol/L — ABNORMAL LOW (ref 135–145)

## 2022-06-01 MED ORDER — HEPARIN SOD (PORK) LOCK FLUSH 100 UNIT/ML IV SOLN: 250.0000 [IU] | INTRAVENOUS | Status: DC | PRN

## 2022-06-01 NOTE — Progress Notes (Signed)
IV team consulted to heparin lock patient's PICC line for discharge. Patient refused IV team's attempt and insisted she flush each line with heparin per her [facility] education. RN witnessed patient connect home dobutamine and flush open port with heparin and clamp port at 1810.   Sherilyn Banker, RN

## 2022-06-01 NOTE — Progress Notes (Signed)
Follow up appointment at [facility] for transplant evaluation is scheduled for 5/23 at 2:10pm with Dr. Allena Katz.  Simmie Davies RN, BSN VAD Coordinator 24/7 Pager [phone]

## 2022-06-01 NOTE — Progress Notes (Signed)
Mobility Specialist: Progress Note   06/01/22 1202  Mobility  Activity Ambulated with assistance in hallway  Level of Assistance Contact guard assist, steadying assist  Assistive Device Other (Comment) (IV pole)  Distance Ambulated (ft) 480 ft  Activity Response Tolerated well  Mobility Referral Yes  $Mobility charge 1 Mobility  Mobility Specialist Start Time (ACUTE ONLY) 1050  Mobility Specialist Stop Time (ACUTE ONLY) 1115  Mobility Specialist Time Calculation (min) (ACUTE ONLY) 25 min   Pre-Mobility: 100 HR, 100% SpO2 During Mobility: 130 HR Post-Mobility: 102 HR, 98% SpO2  Pt received in the bed and agreeable to mobility. Mod I with bed mobility and contact guard during ambulation. No c/o throughout. Pt back to bed after session with call bell and phone at her side.  Shea Kapur Mobility Specialist Please contact via SecureChat or Rehab office at [phone]

## 2022-06-01 NOTE — Progress Notes (Signed)
Consult received to cap and flush for home infusions with DL PICC. Arrived to patient's room and patient was in the middle of flushing her line and connecting her dobutamine. Patient's nurse, Clemens Catholic RN was at [facility]. VAST nurse offered to flush the 2nd lumen/not being used, flush and instill heparin. However, the patient was insistent to flush her line and instill the heparin. Emaleigh RN agreed to document teach back when patient has completed task. Emaleigh RN agreed. Tomasita Morrow, RN VAST

## 2022-06-01 NOTE — Progress Notes (Signed)
[name], female   DOB: January 04, 1971, 52 y.o.   MRN: [id number]     Advanced Heart Failure Rounding Note  PCP-Cardiologist: Armanda Magic, MD   Subjective:   Failed Dobutamine wean. Stabilized back on Dobutamine.    No events overnight. Denies SOB. Wants to go home.   Objective:   Weight Range: 65.3 kg Body mass index is 26.34 kg/m.   Vital Signs:   Temp:  [97.8 F (36.6 C)-98.8 F (37.1 C)] 98.5 F (36.9 C) (05/09 0749) Pulse Rate:  [82-103] 99 (05/09 0749) Resp:  [16-18] 18 (05/09 0338) BP: (104-129)/(62-73) 127/67 (05/09 0749) SpO2:  [95 %-100 %] 98 % (05/09 0749) Weight:  [65.3 kg] 65.3 kg (05/09 0557) Last BM Date : 05/30/22  Weight change: Filed Weights   05/26/22 0625 05/31/22 0703 06/01/22 0557  Weight: 63.8 kg 63.7 kg 65.3 kg    Intake/Output:   Intake/Output Summary (Last 24 hours) at 06/01/2022 0752 Last data filed at 06/01/2022 0400 Gross per 24 hour  Intake 635.83 ml  Output --  Net 635.83 ml     Physical Exam  General:  No resp difficulty HEENT: normal Neck: supple. no JVD. Carotids 2+ bilat; no bruits. No lymphadenopathy or thryomegaly appreciated. Cor: PMI nondisplaced. Regular rate & rhythm. No rubs, gallops or murmurs. Lungs: clear Abdomen: soft, nontender, nondistended. No hepatosplenomegaly. No bruits or masses. Good bowel sounds. Extremities: no cyanosis, clubbing, rash, edema Neuro: alert & orientedx3, cranial nerves grossly intact. moves all 4 extremities w/o difficulty. Affect pleasant  Telemetry  SR 80-90s   Labs    CBC Recent Labs    06/01/22 0350  WBC 6.5  HGB 9.7*  HCT 28.9*  MCV 90.9  PLT 369    Basic Metabolic Panel Recent Labs    32/44/01 0500 06/01/22 0350  NA 136 132*  K 3.8 4.5  CL 100 100  CO2 24 24  GLUCOSE 156* 315*  BUN 7 9  CREATININE 0.90 0.95  CALCIUM 8.9 8.6*   Liver Function Tests No results for input(s): "AST", "ALT", "ALKPHOS", "BILITOT", "PROT", "ALBUMIN" in the last 72  hours. No results for input(s): "LIPASE", "AMYLASE" in the last 72 hours. Cardiac Enzymes No results for input(s): "CKTOTAL", "CKMB", "CKMBINDEX", "TROPONINI" in the last 72 hours.  BNP: BNP (last 3 results) Recent Labs    11/03/21 1151 05/22/22 1529  BNP 127.9* >4,500.0*    ProBNP (last 3 results) No results for input(s): "PROBNP" in the last 8760 hours.   D-Dimer No results for input(s): "DDIMER" in the last 72 hours. Hemoglobin A1C No results for input(s): "HGBA1C" in the last 72 hours.  Fasting Lipid Panel Recent Labs    05/30/22 1442  CHOL 234*  HDL 64  LDLCALC 150*  TRIG 98  CHOLHDL 3.7   Thyroid Function Tests Recent Labs    05/30/22 1442  TSH 13.002*    Other results:   Imaging    No results found.   Medications:     Scheduled Medications:  apixaban  10 mg Oral BID   [START ON 06/07/2022] apixaban  5 mg Oral BID   aspirin EC  81 mg Oral QHS   buPROPion  150 mg Oral BH-q7a   Chlorhexidine Gluconate Cloth  6 each Topical Daily   digoxin  0.125 mg Oral Daily   DULoxetine  30 mg Oral QHS   feeding supplement  237 mL Oral BID BM   hydrocortisone cream   Topical TID   insulin pump  Subcutaneous TID WC, HS, 0200   levothyroxine  100 mcg Oral Q0600   losartan  12.5 mg Oral Daily   metoCLOPramide (REGLAN) injection  10 mg Intravenous Q6H   mometasone-formoterol  2 puff Inhalation BID   montelukast  10 mg Oral QHS   multivitamin with minerals  1 tablet Oral Daily   pantoprazole  40 mg Oral BID   polyethylene glycol  17 g Oral Daily   senna-docusate  1 tablet Oral BID   sodium chloride flush  3 mL Intravenous Q12H   spironolactone  25 mg Oral Daily   traZODone  100 mg Oral QHS    Infusions:  DOBUTamine 3 mcg/kg/min (06/01/22 0400)   promethazine (PHENERGAN) injection (IM or IVPB)      PRN Medications: acetaminophen, dextrose, diclofenac Sodium, diphenhydrAMINE, fluticasone, levalbuterol, ondansetron (ZOFRAN) IV, mouth rinse, polyethylene  glycol, promethazine (PHENERGAN) injection (IM or IVPB), sodium chloride flush, sodium phosphate    Patient Profile   52 y.o.with HFrEF, CAD, s/p CABG in 10/2018 (LIMA to the LAD/ramus intermedius and RIMA to the obtuse marginal), DM Type I,  anemia, anxiety, hypothyroidism, and hyperlipidemia. Admitted w/ DKA. Hospitalization c/b a/c systolic heart failure w/ low output.   Assessment/Plan  1. 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  - Echo 09/2020 EF 20-25%.  - LHC with 09/2020 with severe coronary disease. Total occlusion of the proximal LAD, moderate stenosis of the intermediate branch, and severe ostial circumflex in-stent restenosis.  - Echo 12/27/20  EF 40-45% RV mildly HK - Echo 12/23 EF 45-50%  - Echo this admit EF down 20-25% w/ AK of anteroseptal wall and apex, RV severely reduced. Also w/ Hs trop elevation peaking at 5,048. LHC w/ stable coronary anatomy and 2/2 patent bypass grafts. Suspect drop in EF likely stress induced in setting of critical illness/DKA - RHC c/w low output, LV dominant shock (mRA 4, PAP 44/18, mPCWP 24, LVEDP 24, FICK CO 3.04, CI 1.85, Co-ox 46%) - Failed dobutamine wean. Stabilized on Dobutamine 3 mcg. CO-OX stable at 71%.  - Stable from HF perspective. .  - Continue digoxin 0.125 daily.   - No BB with shock.  - Continue  spiro 25 mg daily.  - Continue 12.5 mg losartan dialy.   - not candidate for SGLT2i w/ Type 1DM and DKA  - Now has end-stage HF d/t severe ICM. No options for revascularization. RV reduced on echo but PAPI okay. Starting workup for transplant with option for LVAD as back-up.  Will need blood glucose well-controlled, hgbA1c this admission was 7.8.  As above, think she will be inotrope-dependent.   - UDS-  Tetrahydrocannabinol. Will need monthly UDS.   2. CAD/NSTEMI  - h/o CABG x 2 2020, LIMA-LAD, RIMA-LCx - HS trop this admit peaked to 5048  - LHC w/ stable disease, 2/2 patent  grafts. No culprit lesions  - suspect trop elevation 2/2 demand ischemia/acute HF  - No chest pain.  - continue medical management    3. DKA - Type 1 DM, developed after insulin pump malfunctioned  - Hgb A1C  7.8 on 5/1.  - now resolved - Needs follow up with Dr Lafe Garin. She will contact.    4. Acute Hypoxic Respiratory Failure - developed after volume IVF resuscitation  - PCWP 24 and LVEDP 24 on RHC. Diuresed. - Resolved on room air.    5. AKI  - Scr peaked to 1.7, in setting of DKA and  low output HF - Resolved   6. Hypokalemia - Resolved.   7. Frequent PVCs - supp K, keep > 4.0  - keep > 2.0   8. DVT, LIJ  Noted on CT of chest. LIJ DVT On heparin drip- switch to eliquis.   Discharge delayed because [facility] unable to bring drug and complete teaching. Home today.   Home with [facility] infusion providing Dobutamine. Dressing changes/labs at Infusion Center at [facility]. Will need weekly CBC, CMET, Mag faxed to HF clinic.   HF TOC CM appreciated. [facility] RN, Lanora Manis # [phone] fax # [phone] and for Home Dobutamine.   She has consultation with Dr Allena Katz later this month.   Length of Stay: 11  Mikah Poss, NP  06/01/2022, 7:52 AM  Advanced Heart Failure Team Pager [phone] (M-F; 7a - 5p)  Please contact [facility] for night-coverage after hours (5p -7a ) and weekends on [email or web address]

## 2022-06-01 NOTE — TOC Progression Note (Addendum)
Transition of Care [facility]) - Progression Note    Patient Details  Name: LOLETTA EDERER MRN: [id number] Date of Birth: December 20, 1970  Transition of Care [facility]) CM/SW Contact  Elliot Cousin, RN Phone Number: [phone] 06/01/2022, 2:43 PM  Clinical Narrative:  HF TOC CM received call from [facility] and they scheduled for [facility] RN to do teaching from 3-4 pm. The other provider scheduled for 1 pm had an emergency. Pt's friend, Mr Josiah Lobo in room for education on Home [facility]. Contacted [facility] to arrange follow up appt with PCP.   Patient has appt with [facility] on 5/16 at 10 am for PICC care and lab draw. Order given to clinic PICC care and lab draws are weekly.   Faxed dc summary to [facility].     Expected Discharge Plan: Home/Self Care Barriers to Discharge: No Barriers Identified  Expected Discharge Plan and Services   Discharge Planning Services: CM Consult   Living arrangements for the past 2 months: Single Family Home Expected Discharge Date: 05/31/22                         HH Arranged:  ([facility]) HH Agency: Other - See comment Date HH Agency Contacted: 06/01/22 Time HH Agency Contacted: [id number] Representative spoke with at [facility] Agency: [facility] Coordinator Lanora Manis   Social Determinants of Health (SDOH) Interventions SDOH Screenings   Food Insecurity: No Food Insecurity (05/26/2022)  Recent Concern: Food Insecurity - Food Insecurity Present (05/15/2022)  Housing: Low Risk  (05/26/2022)  Recent Concern: Housing - High Risk (05/15/2022)  Transportation Needs: No Transportation Needs (05/26/2022)  Recent Concern: Transportation Needs - Unmet Transportation Needs (05/15/2022)  Utilities: Not At Risk (05/26/2022)  Alcohol Screen: Low Risk  (10/05/2020)  Depression (PHQ2-9): High Risk (02/15/2022)  Financial Resource Strain: High Risk (05/15/2022)  Physical Activity: Unknown (05/15/2022)  Social Connections: Socially Isolated  (05/15/2022)  Stress: Stress Concern Present (05/15/2022)  Tobacco Use: Medium Risk (05/30/2022)    Readmission Risk Interventions    05/31/2022   11:59 AM 06/01/2021   10:05 AM  Readmission Risk Prevention Plan  Transportation Screening Complete Complete  HRI or Home Care Consult  Complete  Social Work Consult for Recovery Care Planning/Counseling  Complete  Palliative Care Screening  Not Applicable  Medication Review Oceanographer) Complete Complete  PCP or Specialist appointment within 3-5 days of discharge Not Complete   PCP/Specialist Appt Not Complete comments follow up 06/09/22   HRI or Home Care Consult Complete   SW Recovery Care/Counseling Consult Complete   Palliative Care Screening Not Applicable   Skilled Nursing Facility Not Applicable

## 2022-06-02 ENCOUNTER — Other Ambulatory Visit (HOSPITAL_COMMUNITY): Payer: Self-pay

## 2022-06-02 ENCOUNTER — Other Ambulatory Visit: Payer: Self-pay

## 2022-06-02 LAB — LUPUS ANTICOAGULANT PANEL
DRVVT: 29.2 s (ref 0.0–47.0)
PTT Lupus Anticoagulant: 29.5 s (ref 0.0–43.5)

## 2022-06-02 NOTE — Progress Notes (Signed)
Called and left a message on patient's cellphone voicemail making her aware of her appt at the [facility] infusion clinic on May 16th at 1200 noon for her dressing change and lab draw.  Instructions given on date, time, and location of our clinic.  Left her the phone number to call us with any questions.

## 2022-06-05 ENCOUNTER — Telehealth (HOSPITAL_COMMUNITY): Payer: Self-pay | Admitting: *Deleted

## 2022-06-05 NOTE — Telephone Encounter (Signed)
Received call from pt confirming she is able to bring her potential caregivers to her [facility] clinic appt scheduled 06/08/22 at 1 PM. Rosetta Posner CSW will plan to complete social evaluation following appt. Confirmed pt aware of appt with Dr Laneta Simmers 06/07/22 at 3:30 PM.   Alyce Pagan RN [facility] Coordinator  Office: [phone]  24/7 Pager: [phone]

## 2022-06-06 ENCOUNTER — Other Ambulatory Visit: Payer: Self-pay

## 2022-06-07 ENCOUNTER — Institutional Professional Consult (permissible substitution) (INDEPENDENT_AMBULATORY_CARE_PROVIDER_SITE_OTHER): Payer: Medicaid Other | Admitting: Surgery

## 2022-06-07 ENCOUNTER — Encounter: Payer: Self-pay | Admitting: Surgery

## 2022-06-07 VITALS — BP 98/61 | HR 110 | Resp 18 | Ht 62.0 in | Wt 137.0 lb

## 2022-06-07 DIAGNOSIS — I255 Ischemic cardiomyopathy: Secondary | ICD-10-CM

## 2022-06-07 NOTE — Progress Notes (Signed)
Cardiothoracic Surgery Consultation  PCP is [name], NP Referring Provider is Dorthula Nettles, DO  Chief Complaint  Patient presents with   Ischemic cardiomyopathy with end stage heart failure on home dobutamine therapy.        HPI:  The patient is a 52 year old woman with a history of type I diabetes and DKA, hypothyroidism, anemia, hyperlipidemia, coronary artery disease status post CABG by Dr. Vickey Sages in 10/2018 (LIMA to the LAD/ramus intermedius and RIMA to the obtuse marginal), HFrEF and anxiety who had a left ventricular ejection fraction of 55 to 60% 1 month postoperatively following her CABG.  She was admitted in September 2022 with DKA and NSTEMI.  Her hospital course was complicated by acute respiratory failure and pulmonary edema.  Echocardiogram showed global hypokinesis with akinesis of all apical segments with a left ventricular ejection fraction of 20 to 25%.  Right ventricular systolic function was normal.  She had a follow-up left heart cath on 10/04/2020 showing continued patency of both internal mammary artery grafts.  She was diuresed and discharged home on medical therapy.  She was subsequently admitted in April 2023 with NSTEMI and lactic acidosis.  Echocardiogram at that time showed an ejection fraction of 30 to 35%.  Catheterization showed stable anatomy with no culprit lesions.  She was admitted again in May 2023 and December 2023 with DKA.  Echocardiogram in December 2023 showed improved ejection fraction to 45 to 50%.  She was recently admitted on 05/20/2022 with DKA after her insulin pump malfunction.  She developed acute hypoxic respiratory failure with pulmonary edema after aggressive IV fluid resuscitation.  Her troponin was elevated to 5048 with severe lactic acidosis.  EKG showed significant ST changes.  Echocardiogram showed an ejection fraction that had reduced to 20 to 25% with akinesis of the anteroseptal wall and apex felt to possibly be due to to  stress-induced cardiomyopathy.  RV function was also severely reduced.  She underwent right and left heart catheterization showing severe native coronary disease with patent internal mammary artery grafts to the left circumflex and LAD, unchanged from her prior study. RHC demonstrated low output HF, LV dominant (mRA 4, PAP 44/18, mPCWP 24, FICK CO 3.04, CI 1.85, Co-ox 46%).  She was started on dobutamine 2.5 with an improvement in her Co-ox from 34 to 68.  She was subsequently discharged home.  It was felt by the advanced heart failure team that she would best be treated by cardiac transplant referral but she had a positive THC drug screen.  The current plan is to maintain her on dobutamine as long as she is doing well until she can be considered for transplantation.  She was referred to me for LVAD evaluation in case of decompensation.  She is divorced and lives at home alone.  She has some close friends who help her.  She was a Garment/textile technologist at [employer].  She had a very remote smoking history in her 65s for a short period of time.  She said that she used to smoke marijuana and take oral THC containing products but no longer does. Past Medical History:  Diagnosis Date   Allergy    Anemia    Anxiety    Asthma    CAD S/P percutaneous coronary angioplasty 03/28/2017   Cataract    CHF (congestive heart failure) (HCC)    Coronary artery disease    a. s/p PTCA DES x3 in the LAD (ostial DES placed, mid DES placed, distal  DES placed) and PTCA/DES x1 to intermediate branch 02/2017. b. ACS 02/08/18 with LCx stent placement.   Depression    Diabetes mellitus    type 1   DKA (diabetic ketoacidoses) 12/31/2017   DKA (diabetic ketoacidosis) (HCC) 01/13/2022   Dyspnea    with exertion and dust, no oxygen   Elevated platelet count    GERD (gastroesophageal reflux disease)    Heart murmur    never caused any problems   High cholesterol    Hypothyroidism    Ischemic cardiomyopathy    a. EF  low-normal with basal inferior akinesis, basal septal hypokinesis by echo 01/2018.   Myocardial infarction (HCC) 2019   Nausea and vomiting 12/04/2010   Non-ST elevation (NSTEMI) myocardial infarction [facility]) 02/08/2018   NSTEMI (non-ST elevated myocardial infarction) (HCC)    Substance abuse (HCC)    marijuana    Past Surgical History:  Procedure Laterality Date   25 GAUGE PARS PLANA VITRECTOMY WITH 20 GAUGE MVR PORT Right 11/24/2021   Procedure: 25 GAUGE PARS PLANA VITRECTOMY APPROACH RIGHT EYE, FLUID FLUID EXCHANGE;  Surgeon: Carmela Rima, MD;  Location: [facility] OR;  Service: Ophthalmology;  Laterality: Right;   AIR/FLUID EXCHANGE Right 12/27/2021   Procedure: AIR/FLUID EXCHANGE;  Surgeon: Carmela Rima, MD;  Location: [facility] OR;  Service: Ophthalmology;  Laterality: Right;   APPENDECTOMY     BIOPSY  09/29/2021   Procedure: BIOPSY;  Surgeon: Benancio Deeds, MD;  Location: [facility];  Service: Gastroenterology;;   CATARACT EXTRACTION W/PHACO Right 11/24/2021   Procedure: CATARACT EXTRACTION AND INTRAOCULAR LENS PLACEMENT (IOC) RIGHT EYE;  Surgeon: Carmela Rima, MD;  Location: [facility] OR;  Service: Ophthalmology;  Laterality: Right;   COLONOSCOPY     COLONOSCOPY WITH PROPOFOL N/A 09/29/2021   Procedure: COLONOSCOPY WITH PROPOFOL;  Surgeon: Benancio Deeds, MD;  Location: [facility];  Service: Gastroenterology;  Laterality: N/A;   CORONARY ARTERY BYPASS GRAFT N/A 11/04/2018   Procedure: CORONARY ARTERY BYPASS GRAFTING (CABG), ON PUMP, TIMES THREE, USING LEFT AND RIGHT INTERNAL MAMMARY ARTERIES;  Surgeon: Linden Dolin, MD;  Location: [facility] OR;  Service: Open Heart Surgery;  Laterality: N/A;   CORONARY STENT INTERVENTION N/A 03/20/2017   Procedure: DES x 3 LAD, DES OM; Surgeon: Kathleene Hazel, MD;  Location: [facility] INVASIVE CV LAB;  Service: Cardiovascular;  Laterality: N/A;   CORONARY/GRAFT ACUTE MI REVASCULARIZATION N/A 02/08/2018   Procedure: Coronary/Graft Acute MI  Revascularization;  Surgeon: Lyn Records, MD;  Location: [facility] INVASIVE CV LAB;  Service: Cardiovascular;  Laterality: N/A;   ESOPHAGOGASTRODUODENOSCOPY (EGD) WITH PROPOFOL N/A 09/29/2021   Procedure: ESOPHAGOGASTRODUODENOSCOPY (EGD) WITH PROPOFOL;  Surgeon: Benancio Deeds, MD;  Location: [facility];  Service: Gastroenterology;  Laterality: N/A;   GAS INSERTION Right 04/28/2021   Procedure: INSERTION OF GAS ( C3F8);  Surgeon: Carmela Rima, MD;  Location: [facility] OR;  Service: Ophthalmology;  Laterality: Right;  Right eye   GAS/FLUID EXCHANGE Right 04/28/2021   Procedure: GAS/FLUID EXCHANGE;  Surgeon: Carmela Rima, MD;  Location: [facility] OR;  Service: Ophthalmology;  Laterality: Right;  Right eye   IR FLUORO GUIDE CV LINE RIGHT  05/29/2022   IR US GUIDE VASC ACCESS RIGHT  05/30/2022   LASER PHOTO ABLATION Right 11/24/2021   Procedure: ENDO LASER PAN PHOTOCOAGULATION;  Surgeon: Carmela Rima, MD;  Location: [facility] OR;  Service: Ophthalmology;  Laterality: Right;   LASER PHOTO ABLATION Right 12/27/2021   Procedure: ENDO LASER;  Surgeon: Carmela Rima, MD;  Location: [facility] OR;  Service: Ophthalmology;  Laterality:  Right;   LEFT HEART CATH AND CORONARY ANGIOGRAPHY N/A 03/20/2017   Procedure: LEFT HEART CATH AND CORONARY ANGIOGRAPHY;  Surgeon: Kathleene Hazel, MD;  Location: [facility] CV LAB;  Service: Cardiovascular;  Laterality: N/A;   LEFT HEART CATH AND CORONARY ANGIOGRAPHY N/A 02/08/2018   Procedure: LEFT HEART CATH AND CORONARY ANGIOGRAPHY;  Surgeon: Lyn Records, MD;  Location: [facility] CV [facility];  Service: Cardiovascular;  Laterality: N/A;   LEFT HEART CATH AND CORONARY ANGIOGRAPHY N/A 10/29/2018   Procedure: LEFT HEART CATH AND CORONARY ANGIOGRAPHY;  Surgeon: Kathleene Hazel, MD;  Location: [facility];  Service: Cardiovascular;  Laterality: N/A;   LEFT HEART CATH AND CORS/GRAFTS ANGIOGRAPHY N/A 10/04/2020   Procedure: LEFT HEART CATH AND CORS/GRAFTS ANGIOGRAPHY;  Surgeon:  Tonny Bollman, MD;  Location: [facility] INVASIVE CV LAB;  Service: Cardiovascular;  Laterality: N/A;   LEFT HEART CATH AND CORS/GRAFTS ANGIOGRAPHY N/A 05/11/2021   Procedure: LEFT HEART CATH AND CORS/GRAFTS ANGIOGRAPHY;  Surgeon: [place], Peter M, MD;  Location: [facility] INVASIVE CV LAB;  Service: Cardiovascular;  Laterality: N/A;   PARS PLANA VITRECTOMY Right 04/28/2021   Procedure: PARS PLANA VITRECTOMY WITH 25 GAUGE REMOVAL OF TRATIONAL MEMBRANE RIGHT EYE; DRAINAGE OF SUBRETINAL FLUID RIGHT EYE;  Surgeon: Carmela Rima, MD;  Location: [facility] OR;  Service: Ophthalmology;  Laterality: Right;  Right eye   PARS PLANA VITRECTOMY 27 GAUGE Right 12/27/2021   Procedure: PARS PLANA VITRECTOMY 27 GAUGE;  Surgeon: Carmela Rima, MD;  Location: [facility] OR;  Service: Ophthalmology;  Laterality: Right;   PHOTOCOAGULATION WITH LASER Right 04/28/2021   Procedure: PHOTOCOAGULATION WITH LASER;  Surgeon: Carmela Rima, MD;  Location: [facility] OR;  Service: Ophthalmology;  Laterality: Right;  Right eye   POLYPECTOMY  09/29/2021   Procedure: POLYPECTOMY;  Surgeon: Benancio Deeds, MD;  Location: [facility];  Service: Gastroenterology;;   RIGHT/LEFT HEART CATH AND CORONARY/GRAFT ANGIOGRAPHY N/A 05/23/2022   Procedure: RIGHT/LEFT HEART CATH AND CORONARY/GRAFT ANGIOGRAPHY;  Surgeon: Kathleene Hazel, MD;  Location: [facility] INVAS[facility] CV LAB;  Service: Cardiovascular;  Laterality: N/A;   TEE WITHOUT CARDIOVERSION N/A 11/04/2018   Procedure: TRANSESOPHAGEAL ECHOCARDIOGRAM (TEE);  Surgeon: Linden Dolin, MD;  Location: [facility] OR;  Service: Open Heart Surgery;  Laterality: N/A;    Family History  Problem Relation Age of Onset   Cancer Mother        T cell lymphoma   Hypertension Mother    Hypercalcemia Mother    OCD Father    Anxiety disorder Father    Coronary artery disease Father    Congestive Heart Failure Father    Asthma Father    Hypercalcemia Father    Hypertension Father    Colon polyps Father    Depression Maternal  Grandmother    Diabetes Paternal Grandmother    Stomach cancer Neg Hx    Esophageal cancer Neg Hx    Colon cancer Neg Hx    Rectal cancer Neg Hx     Social History Social History   Tobacco Use   Smoking status: Former    Years: .5    Types: Cigarettes   Smokeless tobacco: Never   Tobacco comments:    Smoked cigarettes in her 20's for 5- 6 months  Vaping Use   Vaping Use: Never used  Substance Use Topics   Alcohol use: Not Currently    Comment: Socially   Drug use: Yes    Types: Marijuana    Comment: using approx. 3 times weekly; reports decreasing use  Current Outpatient Medications  Medication Sig Dispense Refill   acetaminophen (TYLENOL) 325 MG tablet Take 650 mg by mouth daily as needed for mild pain, fever or headache.     albuterol (VENTOLIN HFA) 108 (90 Base) MCG/ACT inhaler INHALE 2 PUFFS INTO THE LUNGS EVERY 6 (SIX) HOURS AS NEEDED FOR WHEEZING OR SHORTNESS OF BREATH. 18 g 2   Alirocumab (PRALUENT) 150 MG/ML SOAJ Inject 1 pen  into the skin every 14 (fourteen) days. 2 mL 11   [START ON 06/30/2022] apixaban (ELIQUIS) 5 MG TABS tablet Take 1 tablet (5 mg total) by mouth 2 (two) times daily. 60 tablet 6   APIXABAN (ELIQUIS) VTE STARTER PACK (10MG  AND 5MG ) Take as directed on package: start with two-5mg  tablets twice daily for 7 days. On day 8, switch to one-5mg  tablet twice daily. 74 each 0   aspirin 81 MG EC tablet Take 1 tablet (81 mg total) by mouth daily. (Patient taking differently: Take 81 mg by mouth at bedtime.) 90 tablet 0   b complex vitamins tablet Take 1 tablet by mouth 3 (three) times a week.     budesonide (PULMICORT) 0.25 MG/2ML nebulizer solution Take 0.25 mg by nebulization daily as needed (shortness of breath/wheezing).     buPROPion (WELLBUTRIN XL) 150 MG 24 hr tablet Take 1 tablet (150 mg total) by mouth every morning. 30 tablet 2   Continuous Glucose Sensor (DEXCOM G6 SENSOR) MISC Use as instructed to check glucose levels. Change every 10 days. 9 each  1   Continuous Glucose Transmitter (DEXCOM G6 TRANSMITTER) MISC Use as instructed to check glucose levels. Change every 90 days. 1 each 1   diclofenac Sodium (VOLTAREN) 1 % GEL Apply 2-4 g topically 3 (three) times daily as needed (knee pain).     digoxin (LANOXIN) 0.125 MG tablet Take 1 tablet (0.125 mg total) by mouth daily. 30 tablet 6   diphenhydrAMINE (BENADRYL) 25 MG tablet Take 37.5 mg by mouth at bedtime.     DOBUTamine (DOBUTREX) 4-5 MG/ML-% infusion Inject 191.4 mcg/min into the vein continuous. Per [facility] home infusion 200 mL 52   DULoxetine (CYMBALTA) 30 MG capsule Take 1 capsule (30 mg total) by mouth at bedtime. 30 capsule 2   fluticasone (FLONASE) 50 MCG/ACT nasal spray Insert 1 spray each nostril daily for 3 days then use 2-3 times a week as needed (Patient taking differently: Place 1 spray into both nostrils daily. Insert 1 spray each nostril daily for 3 days then use 2-3 times a week as needed) 16 g 1   furosemide (LASIX) 20 MG tablet Take 1 tablet (20 mg total) by mouth daily as needed (for weight gain). 15 tablet 3   Glucagon 3 MG/DOSE POWD Place 3 mg into the nose once as needed for up to 1 dose. 1 each 11   Insulin Disposable Pump (OMNIPOD 5 G6 INTRO, GEN 5,) KIT Use as directed. 1 kit 0   Insulin Disposable Pump (OMNIPOD 5 G6 PODS, GEN 5,) MISC Use every 3 (three) days. 30 each 3   insulin lispro (HUMALOG) 100 UNIT/ML injection Inject 0.35 mLs (35 Units total) into the skin daily via pump. 30 mL 1   levothyroxine (SYNTHROID) 100 MCG tablet Take 1 tablet (100 mcg total) by mouth daily before breakfast. 30 tablet 0   losartan (COZAAR) 25 MG tablet Take 1/2 tablet (12.5 mg total) by mouth daily. 30 tablet 6   metoCLOPramide (REGLAN) 5 MG tablet Take 1 tablet (5 mg total) by mouth every  8 (eight) hours as needed for nausea or vomiting. 60 tablet 3   mometasone-formoterol (DULERA) 100-5 MCG/ACT AERO Inhale 2 puffs into the lungs 2 (two) times daily. 13 g 6   montelukast (SINGULAIR)  10 MG tablet Take 1 tablet (10 mg total) by mouth at bedtime. 90 tablet 2   Multiple Vitamin (MULTIVITAMIN WITH MINERALS) TABS tablet Take 1 tablet by mouth daily.     nitroGLYCERIN (NITROSTAT) 0.4 MG SL tablet Place 1 tablet (0.4 mg total) under the tongue every 5 (five) minutes as needed for chest pain. 25 tablet 6   ondansetron (ZOFRAN) 4 MG tablet Take 1 tablet (4 mg total) by mouth daily as needed for nausea or vomiting. 30 tablet 1   pantoprazole (PROTONIX) 40 MG tablet Take 1 tablet (40 mg total) by mouth 2 (two) times daily. 60 tablet 2   spironolactone (ALDACTONE) 25 MG tablet Take 1 tablet (25 mg total) by mouth daily. 30 tablet 6   No current facility-administered medications for this visit.    Allergies  Allergen Reactions   Atorvastatin Other (See Comments)    Myalgias    Crestor [Rosuvastatin Calcium] Other (See Comments)    Severe myalgias and joint pain   Monosodium Glutamate Nausea And Vomiting and Other (See Comments)    MSG - migraine   Zetia [Ezetimibe] Other (See Comments)    myalgias   Ciprofloxin Hcl [Ciprofloxacin] Nausea And Vomiting and Other (See Comments)    Severe migraine   Food Color Red [Red Dye] Diarrhea    Review of Systems  Constitutional:  Positive for activity change and fatigue. Negative for chills and fever.  HENT: Negative.    Eyes: Negative.   Respiratory:  Positive for shortness of breath.   Cardiovascular:  Positive for leg swelling. Negative for chest pain.  Gastrointestinal: Negative.   Endocrine: Negative.   Genitourinary: Negative.   Musculoskeletal: Negative.   Skin: Negative.   Allergic/Immunologic: Negative.   Neurological:  Positive for dizziness. Negative for syncope.  Hematological: Negative.   Psychiatric/Behavioral:  The patient is nervous/anxious.     BP 98/61 (BP Location: Left Arm, Patient Position: Sitting)   Pulse (!) 110   Resp 18   Ht 5\' 2"  (1.575 m)   Wt 137 lb (62.1 kg)   LMP 04/21/2022 (Exact Date)   SpO2  99% Comment: RA  BMI 25.06 kg/m  Physical Exam Constitutional:      Appearance: Normal appearance. She is normal weight.  HENT:     Head: Normocephalic and atraumatic.  Eyes:     Extraocular Movements: Extraocular movements intact.     Conjunctiva/sclera: Conjunctivae normal.     Pupils: Pupils are equal, round, and reactive to light.  Cardiovascular:     Rate and Rhythm: Normal rate and regular rhythm.     Pulses: Normal pulses.     Heart sounds: Normal heart sounds. No murmur heard. Pulmonary:     Effort: Pulmonary effort is normal.     Breath sounds: Normal breath sounds.  Abdominal:     General: Abdomen is flat. Bowel sounds are normal.     Palpations: Abdomen is soft.  Musculoskeletal:        General: No swelling. Normal range of motion.     Cervical back: Normal range of motion and neck supple.  Skin:    General: Skin is warm and dry.  Neurological:     General: No focal deficit present.     Mental Status: She is alert and oriented  to person, place, and time.  Psychiatric:        Mood and Affect: Mood normal.        Behavior: Behavior normal.      Diagnostic Tests:  ECHOCARDIOGRAM LIMITED REPORT       Patient Name:   Jennifer Hogan Date of Exam: 05/22/2022  Medical Rec #:  [id number]         Height:       62.0 in  Accession #:    [id number]        Weight:       141.8 lb  Date of Birth:  08-Apr-1970        BSA:          1.652 m  Patient Age:    51 years          BP:           115/76 mmHg  Patient Gender: F                 HR:           101 bpm.  Exam Location:  Inpatient   Procedure: Limited Echo and Limited Color Doppler   Indications:    Dyspnea R06.00    History:        Patient has prior history of Echocardiogram examinations,  most                 recent 01/15/2022. Cardiomyopathy and CHF, CAD, Prior  CABG,                 Signs/Symptoms:Dyspnea; Risk Factors:Diabetes and  Dyslipidemia.    Sonographer:   Lucendia Herrlich  Referring Phys: [phone]  WHITNEY D HARRIS     Sonographer Comments: Image acquisition challenging due to uncooperative  patient.  IMPRESSIONS     1. Only limited images obtained due to patient not cooperating; akinesis  of the anteroseptal wall and apex with overall severe LV dysfunction;  consider stress induced cardiomyopathy.   2. Left ventricular ejection fraction, by estimation, is 20 to 25%. The  left ventricle has severely decreased function. The left ventricle  demonstrates regional wall motion abnormalities (see scoring  diagram/findings for description).   3. Right ventricular systolic function is severely reduced. The right  ventricular size is normal.   4. Left atrial size was mildly dilated.   5. The mitral valve is normal in structure. Mild mitral valve  regurgitation.   6. The aortic valve is tricuspid. Aortic valve regurgitation is not  visualized. No aortic stenosis is present.   FINDINGS   Left Ventricle: Left ventricular ejection fraction, by estimation, is 20  to 25%. The left ventricle has severely decreased function. The left  ventricle demonstrates regional wall motion abnormalities. The left  ventricular internal cavity size was normal   in size. There is no left ventricular hypertrophy.   Right Ventricle: The right ventricular size is normal. Right ventricular  systolic function is severely reduced.   Left Atrium: Left atrial size was mildly dilated.   Right Atrium: Right atrial size was not well visualized.   Pericardium: There is no evidence of pericardial effusion.   Mitral Valve: The mitral valve is normal in structure. Mild mitral valve  regurgitation.   Tricuspid Valve: The tricuspid valve is normal in structure.   Aortic Valve: The aortic valve is tricuspid. Aortic valve regurgitation is  not visualized. No aortic stenosis is present.   Pulmonic Valve: The pulmonic valve was normal  in structure.   Aorta: The aortic root is normal in size and structure.    Additional Comments: Only limited images obtained due to patient not  cooperating; akinesis of the anteroseptal wall and apex with overall  severe LV dysfunction; consider stress induced cardiomyopathy.    LEFT VENTRICLE  PLAX 2D  LVIDd:         4.50 cm  LVIDs:         3.70 cm  LV PW:         0.60 cm  LV IVS:        0.90 cm  LVOT diam:     1.70 cm  LVOT Area:     2.27 cm     LEFT ATRIUM         Index  LA diam:    3.80 cm 2.30 cm/m     AORTA  Ao Root diam: 3.00 cm  Ao Asc diam:  3.00 cm     SHUNTS  Systemic Diam: 1.70 cm   Olga Millers MD  Electronically signed by Olga Millers MD  Signature Date/Time: 05/22/2022/11:11:06 AM        Final      Physicians  Panel Physicians Referring Physician Case Authorizing Physician  Kathleene Hazel, MD (Primary)     Procedures  RIGHT/LEFT HEART CATH AND CORONARY/GRAFT ANGIOGRAPHY   Conclusion      Prox LAD to Mid LAD lesion is 100% stenosed.   Dist LAD-1 lesion is 70% stenosed.   Dist LAD-2 lesion is 70% stenosed.   Mid LAD lesion is 60% stenosed.   Ost Cx to Prox Cx lesion is 99% stenosed.   Prox Cx to Mid Cx lesion is 70% stenosed.   Ramus-1 lesion is 60% stenosed.   Ramus-2 lesion is 20% stenosed.   Ramus-3 lesion is 70% stenosed.   RPAV lesion is 60% stenosed.   Non-stenotic Mid LAD to Dist LAD lesion was previously treated.   LIMA and is normal in caliber.   RIMA and is normal in caliber.   The graft exhibits no disease.   Severe double vessel CAD Chronic occlusion of ostial LAD stent. The proximal, mid and distal LAD and Diagonal branches fill from the LIMA graft. The ostial Circumflex stent has severe restenosis, unchanged from last cath. Patent RIMA graft that fills the Circumflex Large dominant RCA with mild non-obstructive disease.  PCWP 24 mmHg, LVEDP 24 mmHg.    Recommendations: Continue medical management of CAD. She would benefit from diuresis. Will give one dose of IV Lasix today.     Indications  Elevated troponin [R79.89 (ICD-10-CM)]  Coronary artery disease of native artery of native heart with stable angina pectoris (HCC) [R60.454 (ICD-10-CM)]   Procedural Details  Technical Details Indication: 52 yo female with DM, CAD s/p 3V CABG admitted with DKA, chest pain, elevated troponin.   Procedure: The risks, benefits, complications, treatment options, and expected outcomes were discussed with the patient. The patient and/or family concurred with the proposed plan, giving informed consent. The patient was brought to the cath lab after IV hydration was given. The patient was not sedated. The IV catheter present in the right antecubital vein was changed for a 7 [place] sheath. Right heart catheterization performed with a balloon tipped catheter. The right groin was prepped and draped in the usual manner. Using the modified Seldinger access technique, a 5 French sheath was placed in the right femoral artery using u/s guidance. Standard diagnostic catheters were used to perform selective coronary  angiography. I engaged the RCA and LIMA graft with the JR4 catheter. I non-selectively engaged the RIMA graft with the JR4 catheter. I engaged the left main with a JL4 catheter. LV pressures measured with the JR4 catheter. Mynx closure device placed in the right femoral artery.   There were no immediate complications. The patient was taken to the recovery area in stable condition.     Estimated blood loss <50 mL.   During this procedure no sedation was administered.   Medications (Filter: Administrations occurring from 1044 to 1212 on 05/23/22)  important  Continuous medications are totaled by the amount administered until 05/23/22 1212.   lidocaine (PF) (XYLOCAINE) 1 % injection (mL)  Total volume: 17 mL Date/Time Rate/Dose/Volume Action   05/23/22 1120 2 mL Given   1127 15 mL Given   Heparin (Porcine) in NaCl 1000-0.9 UT/500ML-% SOLN (mL)  Total volume: 1,000 mL Date/Time  Rate/Dose/Volume Action   05/23/22 1147 500 mL Given   1147 500 mL Given   iohexol (OMNIPAQUE) 350 MG/ML injection (mL)  Total volume: 75 mL Date/Time Rate/Dose/Volume Action   05/23/22 1151 75 mL Given   cefTRIAXone (ROCEPHIN) 2 g in sodium chloride 0.9 % 100 mL IVPB (mL/hr)  Total dose: Cannot be calculated* Dosing weight: 64.3 *Administration dose not documented Date/Time Rate/Dose/Volume Action   05/23/22 1044 *Not included in total MAR Hold   HYDROmorphone (DILAUDID) injection 0.5 mg (mg)  Total dose: Cannot be calculated* Dosing weight: 64.3 *Administration dose not documented Date/Time Rate/Dose/Volume Action   05/23/22 1044 *Not included in total MAR Hold   LORazepam (ATIVAN) injection 0.5 mg (mg)  Total dose: Cannot be calculated* Dosing weight: 64.3 *Administration dose not documented Date/Time Rate/Dose/Volume Action   05/23/22 1044 *Not included in total MAR Hold   potassium chloride 10 mEq in 100 mL IVPB (mL/hr)  Total dose: Cannot be calculated* Dosing weight: 64.3 *Administration dose not documented Date/Time Rate/Dose/Volume Action   05/23/22 1044 *Not included in total MAR Hold   1100 *Not included in total Automatically Held   0.9 %  sodium chloride infusion (mL/hr)  Total dose: Cannot be calculated* Dosing weight: 64.3 *Administration dose not documented Date/Time Rate/Dose/Volume Action   05/23/22 1044 *Not included in total MAR Hold   acetaminophen (TYLENOL) tablet 650 mg (mg)  Total dose: Cannot be calculated* Dosing weight: 64.3 *Administration dose not documented Date/Time Rate/Dose/Volume Action   05/23/22 1044 *Not included in total MAR Hold   aspirin EC tablet 81 mg (mg)  Total dose: Cannot be calculated* Dosing weight: 64.3 *Administration dose not documented Date/Time Rate/Dose/Volume Action   05/23/22 1044 *Not included in total MAR Hold   buPROPion (WELLBUTRIN XL) 24 hr tablet 150 mg (mg)  Total dose: Cannot be calculated* *Administration  dose not documented Date/Time Rate/Dose/Volume Action   05/23/22 1044 *Not included in total MAR Hold   Chlorhexidine Gluconate Cloth 2 % PADS 6 each (each)  Total dose: Cannot be calculated* Dosing weight: 64.3 *Administration dose not documented Date/Time Rate/Dose/Volume Action   05/23/22 1044 *Not included in total MAR Hold   clopidogrel (PLAVIX) tablet 75 mg (mg)  Total dose: Cannot be calculated* *Administration dose not documented Date/Time Rate/Dose/Volume Action   05/23/22 1044 *Not included in total MAR Hold   dextrose 50 % solution 0-50 mL (mL)  Total dose: Cannot be calculated* Dosing weight: 64.3 *Administration dose not documented Date/Time Rate/Dose/Volume Action   05/23/22 1044 *Not included in total MAR Hold   diclofenac Sodium (VOLTAREN) 1 % topical gel 2-4  g (g)  Total dose: Cannot be calculated* *Administration dose not documented Date/Time Rate/Dose/Volume Action   05/23/22 1044 *Not included in total MAR Hold   diphenhydrAMINE (BENADRYL) capsule 25 mg (mg)  Total dose: Cannot be calculated* *Administration dose not documented Date/Time Rate/Dose/Volume Action   05/23/22 1044 *Not included in total MAR Hold   DULoxetine (CYMBALTA) DR capsule 30 mg (mg)  Total dose: Cannot be calculated* Dosing weight: 64.3 *Administration dose not documented Date/Time Rate/Dose/Volume Action   05/23/22 1044 *Not included in total MAR Hold   fluticasone (FLONASE) 50 MCG/ACT nasal spray 1 spray (spray)  Total dose: Cannot be calculated* *Administration dose not documented Date/Time Rate/Dose/Volume Action   05/23/22 1044 *Not included in total MAR Hold   insulin aspart (novoLOG) injection 0-15 Units (Units)  Total dose: Cannot be calculated* Dosing weight: 64.3 *Administration dose not documented Date/Time Rate/Dose/Volume Action   05/23/22 1044 *Not included in total MAR Hold   1200 *Not included in total Automatically Held   insulin aspart (novoLOG) injection 3 Units  (Units)  Total dose: Cannot be calculated* Dosing weight: 64.3 *Administration dose not documented Date/Time Rate/Dose/Volume Action   05/23/22 1044 *Not included in total MAR Hold   1115 *Not included in total Automatically Held   insulin glargine-yfgn (SEMGLEE) injection 12 Units (Units)  Total dose: Cannot be calculated* Dosing weight: 64.3 *Administration dose not documented Date/Time Rate/Dose/Volume Action   05/23/22 1044 *Not included in total MAR Hold   levalbuterol (XOPENEX) nebulizer solution 0.63 mg (mg)  Total dose: Cannot be calculated* Dosing weight: 64.3 *Administration dose not documented Date/Time Rate/Dose/Volume Action   05/23/22 1044 *Not included in total MAR Hold   levothyroxine (SYNTHROID) tablet 100 mcg (mcg)  Total dose: Cannot be calculated* Dosing weight: 64.3 *Administration dose not documented Date/Time Rate/Dose/Volume Action   05/23/22 1044 *Not included in total MAR Hold   mometasone-formoterol (DULERA) 100-5 MCG/ACT inhaler 2 puff (puff)  Total dose: Cannot be calculated* Dosing weight: 64.3 *Administration dose not documented Date/Time Rate/Dose/Volume Action   05/23/22 1044 *Not included in total MAR Hold   montelukast (SINGULAIR) tablet 10 mg (mg)  Total dose: Cannot be calculated* *Administration dose not documented Date/Time Rate/Dose/Volume Action   05/23/22 1044 *Not included in total MAR Hold   norepinephrine (LEVOPHED) 4mg  in (0.016 mg/mL) premix infusion (mcg/min)  Total dose: Cannot be calculated* Dosing weight: 64.3 *Administration dose not documented Date/Time Rate/Dose/Volume Action   05/23/22 1044 *Not included in total MAR Hold   Oral care mouth rinse (mL)  Total dose: Cannot be calculated* Dosing weight: 64.3 *Administration dose not documented Date/Time Rate/Dose/Volume Action   05/23/22 1044 *Not included in total MAR Hold   pantoprazole (PROTONIX) EC tablet 40 mg (mg)  Total dose: Cannot be calculated* Dosing weight:  64.3 *Administration dose not documented Date/Time Rate/Dose/Volume Action   05/23/22 1044 *Not included in total MAR Hold   polyethylene glycol (MIRALAX / GLYCOLAX) packet 17 g (g)  Total dose: Cannot be calculated* Dosing weight: 64.3 *Administration dose not documented Date/Time Rate/Dose/Volume Action   05/23/22 1044 *Not included in total MAR Hold   polyethylene glycol (MIRALAX / GLYCOLAX) packet 17 g (g)  Total dose: Cannot be calculated* Dosing weight: 64.3 *Administration dose not documented Date/Time Rate/Dose/Volume Action   05/23/22 1044 *Not included in total MAR Hold   promethazine (PHENERGAN) 12.5 mg in sodium chloride 0.9 % 50 mL IVPB (mL/hr)  Total dose: Cannot be calculated* Dosing weight: 64.3 *Administration dose not documented Date/Time Rate/Dose/Volume Action   05/23/22  1044 *Not included in total MAR Hold   senna-docusate (Senokot-S) tablet 1 tablet (tablet)  Total dose: Cannot be calculated* Dosing weight: 64.3 *Administration dose not documented Date/Time Rate/Dose/Volume Action   05/23/22 1044 *Not included in total MAR Hold    Contrast     Administrations occurring from 1044 to 1212 on 05/23/22:  Medication Name Total Dose  iohexol (OMNIPAQUE) 350 MG/ML injection 75 mL   Radiation/Fluoro  Fluoro time: 7.7 (min) DAP: 10412 (mGycm2) Cumulative Air Kerma: 164 (mGy) Complications  Complications documented before study signed (05/23/2022 12:17 PM)   No complications were associated with this study.  Documented by Argie Ramming, RN - 05/23/2022 12:02 PM     Coronary Findings  Diagnostic Dominance: Right Left Anterior Descending  Vessel is large.  Prox LAD to Mid LAD lesion is 100% stenosed. The lesion was previously treated using a drug eluting stent between 1-2 years ago.  Mid LAD lesion is 60% stenosed. The lesion is eccentric.  Non-stenotic Mid LAD to Dist LAD lesion was previously treated.  Dist LAD-1 lesion is 70% stenosed.  Dist LAD-2  lesion is 70% stenosed.    Ramus Intermedius  Vessel is large.  Ramus-1 lesion is 60% stenosed.  Ramus-2 lesion is 20% stenosed. The lesion was previously treated using a drug eluting stent between 1-2 years ago.  Ramus-3 lesion is 70% stenosed.    Left Circumflex  Ost Cx to Prox Cx lesion is 99% stenosed. The lesion was previously treated using a drug eluting stent between 6-12 months ago.  Prox Cx to Mid Cx lesion is 70% stenosed.    Right Coronary Artery  Vessel is large. The vessel exhibits minimal luminal irregularities.    Right Posterior Atrioventricular Artery  RPAV lesion is 60% stenosed.    Sequential LIMA LIMA Graft To Ramus, Dist LAD  LIMA and is normal in caliber. sequential to ramus intermediate and distal LAD    RIMA RIMA Graft To Dist Cx  RIMA and is normal in caliber. The graft exhibits no disease.    Intervention   No interventions have been documented.   Coronary Diagrams  Diagnostic Dominance: Right  Intervention   Implants   Vascular Products  Closure Mynx Control 79f - [id number] - Implanted  Inventory item: CLOSURE [facility] CONTROL 12F Model/Cat number: [id number]  Manufacturer: [employer] Lot number: [id number]  Device identifier: [id number] Device identifier type: GS1  GUDID Information  Request status Successful    Brand name: MYNX CONTROL Version/Model: [id number]  Company name: [employer] MRI safety info as of 05/23/22: MR Safe  Contains dry or latex rubber: No    GMDN P.T. name: Wound hydrogel dressing, non-antimicrobial    As of 05/23/2022  Status: Implanted       Syngo Images   Show images for CARDIAC CATHETERIZATION Images on Long Term Storage   Show images for [place], Makaia A Link to Procedure Log  Procedure Log    Hemo Data  Flowsheet Row Most Recent Value  Fick Cardiac Output 3.04 L/min  Fick Cardiac Output Index 1.85 (L/min)/BSA  RA A Wave 10 mmHg  RA V Wave 6 mmHg  RA Mean 4 mmHg  RV Systolic  Pressure 45 mmHg  RV Diastolic Pressure -4 mmHg  RV EDP 11 mmHg  PA Systolic Pressure 44 mmHg  PA Diastolic Pressure 18 mmHg  PA Mean 29 mmHg  PW A Wave 25 mmHg  PW V Wave 27 mmHg  PW Mean 24 mmHg  AO  Systolic Pressure 124 mmHg  AO Diastolic Pressure 76 mmHg  AO Mean 98 mmHg  LV Systolic Pressure 127 mmHg  LV Diastolic Pressure 5 mmHg  LV EDP 24 mmHg  AOp Systolic Pressure 126 mmHg  AOp Diastolic Pressure 79 mmHg  AOp Mean Pressure 100 mmHg  LVp Systolic Pressure 125 mmHg  LVp Diastolic Pressure 5 mmHg  LVp EDP Pressure 24 mmHg  QP/QS 1  TPVR Index 15.71 HRUI  TSVR Index 54.2 HRUI  PVR SVR Ratio 0.05  TPVR/TSVR Ratio 0.29    Narrative & Impression  CLINICAL DATA:  Metastatic disease evaluation. LVAD evaluation.   EXAM: CT CHEST WITH CONTRAST   TECHNIQUE: Multidetector CT imaging of the chest was performed during intravenous contrast administration.   RADIATION DOSE REDUCTION: This exam was performed according to the departmental dose-optimization program which includes automated exposure control, adjustment of the mA and/or kV according to patient size and/or use of iterative reconstruction technique.   CONTRAST:  50mL OMNIPAQUE IOHEXOL 350 MG/ML SOLN   COMPARISON:  Chest CT 09/30/2020   FINDINGS: Cardiovascular: There are intraluminal filling defects within the left internal jugular vein, series 3 images 2 through 12. Right internal jugular dialysis catheter tip in the lower SVC. Prior median sternotomy. The heart is normal in size. The thoracic aorta is normal in caliber. Calcifications /stents of the coronary arteries. Mild anterior pericardial thickening but no significant pericardial fluid. There is no evidence of central pulmonary embolus on this exam not tailored to pulmonary arteries assessment.   Mediastinum/Nodes: Nonspecific wall thickening of the distal esophagus. No enlarged mediastinal, hilar, or axillary lymph nodes. No thyroid nodule.    Lungs/Pleura: Minimal subsegmental bandlike opacities in the lower lobes, atelectasis or scarring. No pneumonia or focal airspace disease. No pulmonary nodule. No pleural effusion. The trachea and central airways are clear.   Upper Abdomen: Moderate stool in the included colon. No acute upper abdominal findings. There is retained food in the stomach.   Musculoskeletal: No focal bone lesion. Prior median sternotomy. Thoracic spondylosis with spurring.   IMPRESSION: 1. Filling defects within the left internal jugular vein consistent with DVT. 2. Nonspecific wall thickening of the distal esophagus, can be seen with reflux or esophagitis. Recommend endoscopy if there is clinical concern for esophageal mass. 3. No evidence of metastatic disease in the thorax.   These results will be called to the ordering clinician or representative by the Radiologist Assistant, and communication documented in the PACS or [employer].     Electronically Signed   By: Narda Rutherford M.D.   On: 05/30/2022 22:40     Impression:  This 52 year old woman has end-stage congestive heart failure with NYHA class II-lll symptoms on home dobutamine at 3 mcg.  Her recent echo shows an ejection fraction of 20 to 25% with severely decreased RV function.  Cardiac catheterization showed stable multivessel coronary disease with 2/2 patent bypass grafts.  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  Her right heart catheterization showed low cardiac output with LV predominant shock with a pulmonary capillary wedge pressure of 24, PA pressure of 44/18, and low right atrial pressure of 4.  I agree that she is going to need transplantation or LVAD therapy.  Given her young age she is going to be evaluated at [facility] for consideration of transplantation.  She did have a positive urine drug screen for [employer] which may delay a decision  about transplantation.  I think she would be a reasonable candidate for LVAD therapy if needed as a bridge to transplantation or destination therapy if she is not felt to be a transplant candidate.  Ideally it would be best not to have to bridge her since she has already had 1 sternotomy for coronary bypass surgery.  I discussed the operative procedure of LVAD implantation, possible complications, expected postoperative recovery and answered all of her questions.   Plan:  She will undergo transplant evaluation at [facility] and we will discuss her case further at our multidisciplinary medical review board after that is completed.  I spent 60 minutes performing this consultation and > 50% of this time was spent face to face counseling and coordinating the care of this patient's end stage heart failure.    Alleen Borne, MD [facility] [phone]

## 2022-06-08 ENCOUNTER — Other Ambulatory Visit: Payer: Self-pay

## 2022-06-08 ENCOUNTER — Ambulatory Visit (HOSPITAL_COMMUNITY)
Admission: RE | Admit: 2022-06-08 | Discharge: 2022-06-08 | Disposition: A | Discharge status: Home / Self Care | Payer: Medicare Other | Source: Ambulatory Visit | Attending: Internal Medicine | Admitting: Internal Medicine

## 2022-06-08 ENCOUNTER — Encounter (HOSPITAL_COMMUNITY): Payer: Self-pay | Admitting: Internal Medicine

## 2022-06-08 ENCOUNTER — Encounter (HOSPITAL_COMMUNITY): Payer: Medicaid Other | Admitting: Internal Medicine

## 2022-06-08 ENCOUNTER — Encounter: Payer: Medicaid Other | Admitting: *Deleted

## 2022-06-08 ENCOUNTER — Ambulatory Visit (HOSPITAL_COMMUNITY)
Admission: RE | Admit: 2022-06-08 | Discharge: 2022-06-08 | Disposition: A | Discharge status: Home / Self Care | Payer: Medicare Other | Source: Ambulatory Visit | Attending: Cardiology | Admitting: Cardiology

## 2022-06-08 VITALS — Wt 138.6 lb

## 2022-06-08 DIAGNOSIS — Z79890 Hormone replacement therapy (postmenopausal): Secondary | ICD-10-CM | POA: Insufficient documentation

## 2022-06-08 DIAGNOSIS — Z959 Presence of cardiac and vascular implant and graft, unspecified: Secondary | ICD-10-CM | POA: Diagnosis not present

## 2022-06-08 DIAGNOSIS — Z79899 Other long term (current) drug therapy: Secondary | ICD-10-CM | POA: Diagnosis not present

## 2022-06-08 DIAGNOSIS — Z5986 Financial insecurity: Secondary | ICD-10-CM | POA: Diagnosis not present

## 2022-06-08 DIAGNOSIS — Z794 Long term (current) use of insulin: Secondary | ICD-10-CM | POA: Insufficient documentation

## 2022-06-08 DIAGNOSIS — Z5982 Transportation insecurity: Secondary | ICD-10-CM | POA: Diagnosis not present

## 2022-06-08 DIAGNOSIS — Z7901 Long term (current) use of anticoagulants: Secondary | ICD-10-CM | POA: Diagnosis not present

## 2022-06-08 DIAGNOSIS — I5023 Acute on chronic systolic (congestive) heart failure: Secondary | ICD-10-CM | POA: Insufficient documentation

## 2022-06-08 DIAGNOSIS — F331 Major depressive disorder, recurrent, moderate: Secondary | ICD-10-CM | POA: Diagnosis not present

## 2022-06-08 DIAGNOSIS — I255 Ischemic cardiomyopathy: Secondary | ICD-10-CM | POA: Insufficient documentation

## 2022-06-08 DIAGNOSIS — Z955 Presence of coronary angioplasty implant and graft: Secondary | ICD-10-CM | POA: Insufficient documentation

## 2022-06-08 DIAGNOSIS — F419 Anxiety disorder, unspecified: Secondary | ICD-10-CM | POA: Diagnosis not present

## 2022-06-08 DIAGNOSIS — I951 Orthostatic hypotension: Secondary | ICD-10-CM | POA: Insufficient documentation

## 2022-06-08 DIAGNOSIS — E1010 Type 1 diabetes mellitus with ketoacidosis without coma: Secondary | ICD-10-CM | POA: Diagnosis not present

## 2022-06-08 DIAGNOSIS — I5084 End stage heart failure: Secondary | ICD-10-CM | POA: Diagnosis not present

## 2022-06-08 DIAGNOSIS — E7800 Pure hypercholesterolemia, unspecified: Secondary | ICD-10-CM | POA: Insufficient documentation

## 2022-06-08 DIAGNOSIS — F411 Generalized anxiety disorder: Secondary | ICD-10-CM | POA: Diagnosis not present

## 2022-06-08 DIAGNOSIS — E039 Hypothyroidism, unspecified: Secondary | ICD-10-CM | POA: Insufficient documentation

## 2022-06-08 DIAGNOSIS — I252 Old myocardial infarction: Secondary | ICD-10-CM | POA: Insufficient documentation

## 2022-06-08 DIAGNOSIS — I5022 Chronic systolic (congestive) heart failure: Secondary | ICD-10-CM | POA: Diagnosis present

## 2022-06-08 DIAGNOSIS — N179 Acute kidney failure, unspecified: Secondary | ICD-10-CM | POA: Diagnosis not present

## 2022-06-08 DIAGNOSIS — I2510 Atherosclerotic heart disease of native coronary artery without angina pectoris: Secondary | ICD-10-CM | POA: Diagnosis not present

## 2022-06-08 DIAGNOSIS — Z951 Presence of aortocoronary bypass graft: Secondary | ICD-10-CM | POA: Insufficient documentation

## 2022-06-08 DIAGNOSIS — Z87891 Personal history of nicotine dependence: Secondary | ICD-10-CM | POA: Diagnosis not present

## 2022-06-08 DIAGNOSIS — Z006 Encounter for examination for normal comparison and control in clinical research program: Secondary | ICD-10-CM

## 2022-06-08 LAB — COMPREHENSIVE METABOLIC PANEL
ALT: 20 U/L (ref 0–44)
AST: 26 U/L (ref 15–41)
Albumin: 3.6 g/dL (ref 3.5–5.0)
Alkaline Phosphatase: 65 U/L (ref 38–126)
Anion gap: 11 (ref 5–15)
BUN: 16 mg/dL (ref 6–20)
CO2: 21 mmol/L — ABNORMAL LOW (ref 22–32)
Calcium: 9 mg/dL (ref 8.9–10.3)
Chloride: 102 mmol/L (ref 98–111)
Creatinine, Ser: 0.88 mg/dL (ref 0.44–1.00)
GFR, Estimated: >60 mL/min (ref >60)
Glucose, Bld: 291 mg/dL — ABNORMAL HIGH (ref 70–99)
Potassium: 4.1 mmol/L (ref 3.5–5.1)
Sodium: 134 mmol/L — ABNORMAL LOW (ref 135–145)
Total Bilirubin: 0.5 mg/dL (ref 0.3–1.2)
Total Protein: 6.7 g/dL (ref 6.5–8.1)

## 2022-06-08 LAB — CBC
HCT: 30.8 % — ABNORMAL LOW (ref 36.0–46.0)
Hemoglobin: 9.8 g/dL — ABNORMAL LOW (ref 12.0–15.0)
MCH: 29.5 pg (ref 26.0–34.0)
MCHC: 31.8 g/dL (ref 30.0–36.0)
MCV: 92.8 fL (ref 80.0–100.0)
Platelets: 444 10*3/uL — ABNORMAL HIGH (ref 150–400)
RBC: 3.32 MIL/uL — ABNORMAL LOW (ref 3.87–5.11)
RDW: 14.5 % (ref 11.5–15.5)
WBC: 4.4 10*3/uL (ref 4.0–10.5)
nRBC: 0 % (ref 0.0–0.2)

## 2022-06-08 LAB — MAGNESIUM: Magnesium: 2.1 mg/dL (ref 1.7–2.4)

## 2022-06-08 MED ORDER — NITROGLYCERIN 0.4 MG SL SUBL
0.4000 mg | SUBLINGUAL_TABLET | SUBLINGUAL | 6 refills | Status: DC | PRN
  Filled 2022-06-08: qty 25, 8d supply, fill #0

## 2022-06-08 MED ORDER — DULOXETINE HCL 30 MG PO CPEP
30.0000 mg | ORAL_CAPSULE | Freq: Every day | ORAL | 2 refills | Status: DC
Start: 2022-06-08 — End: 2022-08-10
  Filled 2022-06-08: qty 30, 30d supply, fill #0
  Filled 2022-07-10: qty 30, 30d supply, fill #1

## 2022-06-08 NOTE — Patient Instructions (Addendum)
Follow up in VAD Clinic 2 months STOP Spironolactone   Order given for compression stockings. You will need knee high 20-64mmgh Appointment with [facility] 5/23 3:00pm; Please arrive early to this appointment

## 2022-06-08 NOTE — Progress Notes (Signed)
Patient presents for hospital follow up in VAD Clinic today with her two caregivers.   Pt walked into clinic today unassisted. Denies shortness of breath, falls, and signs of bleeding. States she is currently able to complete ADLs independently. She occasionally experiences lightheadedness/dizziness when standing up quickly. Orthostatics obtained today. See below.   Recently admitted for DKA and NSTEMI. Hospital course complicated by cardiogenic shock. Pt stabilized on of Dobutamine after failed wean in hospital. VAD and transplant workup initiated in hospital. Pt to be see at [facility] 5/23 at 3:00pm at [facility] with Dr. Allena Katz. Pt reports no issues with PICC line or Dobutamine infusion since discharge. Pt receiving Dobutamine from [facility] with home health provided by [facility] and having weekly dressing changes and labs drawn at [facility].  Plan to discontinue Spironolactone per Dr. Gala Romney. Pt given order for knee high compression socks to obtain from medical supply store.  Vital Signs:  HR: 102 NSR BP: 124/69 (101) SPO2: 97 % Weight: 138.6 lbs  Discharge weight: 144 lbs  Orthostatics:  Orthostatic VS for the past 72 hrs (Last 3 readings):  Orthostatic BP Patient Position BP Location Orthostatic Pulse  06/08/22 1337 (!) 81/50 Standing Right Arm 102  06/08/22 1336 112/60 Sitting Right Arm 94  06/08/22 1335 124/69 Supine Right Arm 101    Symptom YES NO DETAILS  Angina    X Activity:  Claudication    X How Far:  Syncope   X When:   Stroke    X    Orthopnea    X How many pillows:  PND    X How often:  CPAP    X How many hours:  Pedal Edema   X    Abdominal Fullness   X    Nausea / Vomit   X    Diaphoresis   X When:   Shortness of Breath   X Activity:  Palpitations   X When:  ICD shock   X    Bleeding S/S   X    Tea-colored Urine    X    Hospitalizations       Emergency Room   X    Other MD        Activity   Fluid Pt endorses she is drinking  approximately 64oz of fluid daily  Diet  Pt endorses good appetite that is slowing increasing since discharge    Device: N/A   Patient Instructions: Follow up in VAD Clinic 2 months STOP Spironolactone   Order given for compression stockings. You will need knee high 20-1mmgh Appointment with [facility] 5/23 3:00pm; Please arrive early to this appointment   Simmie Davies, RN,BSN VAD Coordinator  Office: [phone] 24/7 Emergency VAD Pager: [phone]

## 2022-06-08 NOTE — Progress Notes (Addendum)
Advanced Heart Failure Clinic Note    PCP: [name], NP PCP-Cardiologist: Armanda Magic, MD  HF Cardiologist: Dr. Gala Romney  HPI: Ms Pruyn is a 52 y.o.with HFrEF, CAD, status post coronary artery bypass and graft (LIMA to the LAD/ramus intermedius and RIMA to the obtuse marginal), DM Type I,  anemia, anxiety, hypothyroidism, and hyperlipidemia.    Admitted 09/2020 with DKA and NSTEMI.  Hospital course complicated by acute respiratory failure and pulmonary edema. Had ECHO with reduced EF down to 20-25%. LHC showed severe CAD with total occlusion of the proximal LAD, moderate stenosis of the intermediate branch, and severe ostial circumflex in-stent restenosis. Diuresed with IV lasix. Discharged on Toprol Xl and spironolactone. Discharged to home on 10/06/2020.    Admitted 4/23 with NSTEMI and lactic acidosis. Echo EF 30-35% Cath with stable anatomy.  No culprit.  Severe 2 vessel obstructive CAD (LAD CTO, Ost LCx 99%, RCA distal 60%) Patent sequential LIMA graft  to the ramus intermediate and distal LAD Patent RIMA to the distal LCx Moderately elevated LVEDP  Admitted 5/23 with DKA.   Admitted 04/27-05/08/24 with DKA (in setting of malfunctioning insulin pump) and NSTEMI. HS troponin > 24,000. Hospital course c/b cardiogenic shock. L/RHC  w/ stable coronary anatomy and 2/2 patent bypass grafts, elevated PCWP, cardiac index 1.8, and PA sat 46%. Started on inotropes. Once diuresed attempted inotrope wean but quickly decompensated. Stabilized back on Dobutamine 3 mcg. Started workup for transplant with possible LVAD as backup. Tested + for Tetrahydrocannabinol and Hgb A1c 7.8. CT chest for VAD workup showed LIJ DVT. Placed on eliquis DVT starter kit. She was discharged home on dobutamine.  She is here today for close follow-up. Two of her friends/caregivers are present. Has been doing well. Feels that her activity tolerance has improved with home inotrope. More energy throughout the  day. Her weight has been stable. No significant dyspnea, orthopnea, PND or lower extremity edema. May get a little whoozy with position changes. No falls or syncope.    Cardiac Testing  Limited echo 04/24: EF 20-25%, akinesis anteroseptal wall and apex, RV severely reduced Echo 4/23: Echo EF 30-35% Echo (12/27/20)  EF 40-45% RV mildly HK Personally reviewed Echo 09/2020 EF 20-25%  Echo 11/2018 EF 55-60%    R/LHC 04/24: Severe double vessel CAD Chronic occlusion of ostial LAD stent. The proximal, mid and distal LAD and Diagonal branches fill from the LIMA graft. The ostial Circumflex stent has severe restenosis, unchanged from last cath. Patent RIMA graft that fills the Circumflex Large dominant RCA with mild non-obstructive disease.    Cath 09/2020   Mid LAD lesion is 60% stenosed.   Ost Cx to Prox Cx lesion is 99% stenosed.   Prox Cx to Mid Cx lesion is 70% stenosed.   Ramus-1 lesion is 60% stenosed.   Ramus-2 lesion is 20% stenosed.   Ramus-3 lesion is 70% stenosed.   Prox LAD to Mid LAD lesion is 100% stenosed.   Dist LAD-1 lesion is 70% stenosed.   Dist LAD-2 lesion is 70% stenosed.   Non-stenotic Mid LAD to Dist LAD lesion was previously treated.   RIMA graft was visualized by angiography and is small.   LIMA and is large.   The graft exhibits no disease.   LV end diastolic pressure is normal.  Severe native vessel CAD with total occlusion of the proximal LAD, moderate stenosis of the intermediate branch, and severe ostial circumflex in-stent restenosis S/P CABG with patency of the both the RIMA-circumflex and  LAD sequential to the intermediate   Past Medical History:  Diagnosis Date   Allergy    Anemia    Anxiety    Asthma    CAD S/P percutaneous coronary angioplasty 03/28/2017   Cataract    CHF (congestive heart failure) (HCC)    Coronary artery disease    a. s/p PTCA DES x3 in the LAD (ostial DES placed, mid DES placed, distal DES placed) and PTCA/DES x1 to  intermediate branch 02/2017. b. ACS 02/08/18 with LCx stent placement.   Depression    Diabetes mellitus    type 1   DKA (diabetic ketoacidoses) 12/31/2017   DKA (diabetic ketoacidosis) (HCC) 01/13/2022   Dyspnea    with exertion and dust, no oxygen   Elevated platelet count    GERD (gastroesophageal reflux disease)    Heart murmur    never caused any problems   High cholesterol    Hypothyroidism    Ischemic cardiomyopathy    a. EF low-normal with basal inferior akinesis, basal septal hypokinesis by echo 01/2018.   Myocardial infarction [facility]) 2019   Nausea and vomiting 12/04/2010   Non-ST elevation (NSTEMI) myocardial infarction [facility]) 02/08/2018   NSTEMI (non-ST elevated myocardial infarction) (HCC)    Substance abuse (HCC)    marijuana   Current Outpatient Medications  Medication Sig Dispense Refill   acetaminophen (TYLENOL) 325 MG tablet Take 650 mg by mouth daily as needed for mild pain, fever or headache.     albuterol (VENTOLIN HFA) 108 (90 Base) MCG/ACT inhaler INHALE 2 PUFFS INTO THE LUNGS EVERY 6 (SIX) HOURS AS NEEDED FOR WHEEZING OR SHORTNESS OF BREATH. 18 g 2   [START ON 06/30/2022] apixaban (ELIQUIS) 5 MG TABS tablet Take 1 tablet (5 mg total) by mouth 2 (two) times daily. 60 tablet 6   aspirin 81 MG EC tablet Take 1 tablet (81 mg total) by mouth daily. (Patient taking differently: Take 81 mg by mouth at bedtime.) 90 tablet 0   b complex vitamins tablet Take 1 tablet by mouth 3 (three) times a week.     budesonide (PULMICORT) 0.25 MG/2ML nebulizer solution Take 0.25 mg by nebulization daily as needed (shortness of breath/wheezing).     buPROPion (WELLBUTRIN XL) 150 MG 24 hr tablet Take 1 tablet (150 mg total) by mouth every morning. 30 tablet 2   Continuous Glucose Sensor (DEXCOM G6 SENSOR) MISC Use as instructed to check glucose levels. Change every 10 days. 9 each 1   Continuous Glucose Transmitter (DEXCOM G6 TRANSMITTER) MISC Use as instructed to check glucose levels. Change  every 90 days. 1 each 1   diclofenac Sodium (VOLTAREN) 1 % GEL Apply 2-4 g topically 3 (three) times daily as needed (knee pain).     digoxin (LANOXIN) 0.125 MG tablet Take 1 tablet (0.125 mg total) by mouth daily. 30 tablet 6   diphenhydrAMINE (BENADRYL) 25 MG tablet Take 37.5 mg by mouth at bedtime.     DOBUTamine (DOBUTREX) 4-5 MG/ML-% infusion Inject 191.4 mcg/min into the vein continuous. Per [facility] home infusion 200 mL 52   fluticasone (FLONASE) 50 MCG/ACT nasal spray Insert 1 spray each nostril daily for 3 days then use 2-3 times a week as needed (Patient taking differently: Place 1 spray into both nostrils daily. Insert 1 spray each nostril daily for 3 days then use 2-3 times a week as needed) 16 g 1   furosemide (LASIX) 20 MG tablet Take 1 tablet (20 mg total) by mouth daily as needed (for weight  gain). 15 tablet 3   Glucagon 3 MG/DOSE POWD Place 3 mg into the nose once as needed for up to 1 dose. 1 each 11   Insulin Disposable Pump (OMNIPOD 5 G6 INTRO, GEN 5,) KIT Use as directed. 1 kit 0   Insulin Disposable Pump (OMNIPOD 5 G6 PODS, GEN 5,) MISC Use every 3 (three) days. 30 each 3   insulin lispro (HUMALOG) 100 UNIT/ML injection Inject 0.35 mLs (35 Units total) into the skin daily via pump. 30 mL 1   levothyroxine (SYNTHROID) 100 MCG tablet Take 1 tablet (100 mcg total) by mouth daily before breakfast. 30 tablet 0   losartan (COZAAR) 25 MG tablet Take 1/2 tablet (12.5 mg total) by mouth daily. 30 tablet 6   metoCLOPramide (REGLAN) 5 MG tablet Take 1 tablet (5 mg total) by mouth every 8 (eight) hours as needed for nausea or vomiting. 60 tablet 3   mometasone-formoterol (DULERA) 100-5 MCG/ACT AERO Inhale 2 puffs into the lungs 2 (two) times daily. 13 g 6   montelukast (SINGULAIR) 10 MG tablet Take 1 tablet (10 mg total) by mouth at bedtime. 90 tablet 2   Multiple Vitamin (MULTIVITAMIN WITH MINERALS) TABS tablet Take 1 tablet by mouth daily.     ondansetron (ZOFRAN) 4 MG tablet Take 1 tablet  (4 mg total) by mouth daily as needed for nausea or vomiting. 30 tablet 1   pantoprazole (PROTONIX) 40 MG tablet Take 1 tablet (40 mg total) by mouth 2 (two) times daily. 60 tablet 2   spironolactone (ALDACTONE) 25 MG tablet Take 1 tablet (25 mg total) by mouth daily. 30 tablet 6   Alirocumab (PRALUENT) 150 MG/ML SOAJ Inject 1 pen  into the skin every 14 (fourteen) days. (Patient not taking: Reported on 06/08/2022) 2 mL 11   APIXABAN (ELIQUIS) VTE STARTER PACK (10MG  AND 5MG ) Take as directed on package: start with two-5mg  tablets twice daily for 7 days. On day 8, switch to one-5mg  tablet twice daily. (Patient not taking: Reported on 06/08/2022) 74 each 0   DULoxetine (CYMBALTA) 30 MG capsule Take 1 capsule (30 mg total) by mouth at bedtime. 30 capsule 2   nitroGLYCERIN (NITROSTAT) 0.4 MG SL tablet Place 1 tablet (0.4 mg total) under the tongue every 5 (five) minutes as needed for chest pain. 25 tablet 6   No current facility-administered medications for this encounter.   Allergies  Allergen Reactions   Atorvastatin Other (See Comments)    Myalgias    Crestor [Rosuvastatin Calcium] Other (See Comments)    Severe myalgias and joint pain   Monosodium Glutamate Nausea And Vomiting and Other (See Comments)    MSG - migraine   Zetia [Ezetimibe] Other (See Comments)    myalgias   Ciprofloxin Hcl [Ciprofloxacin] Nausea And Vomiting and Other (See Comments)    Severe migraine   Food Color Red [Red Dye] Diarrhea   Social History   Socioeconomic History   Marital status: Divorced    Spouse name: Not on file   Number of children: 0   Years of education: Not on file   Highest education level: Doctorate  Occupational History   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 3 times weekly; reports decreasing use   Sexual activity: Not Currently    Birth control/protection: None  Other Topics Concern   Not on file  Social History Narrative   Not on file   Social Determinants of Health   Financial Resource Strain: High Risk (05/15/2022)   Overall Financial Resource Strain (CARDIA)    Difficulty of Paying Living Expenses: Very hard  Food Insecurity: No Food Insecurity (05/26/2022)   Hunger Vital Sign    Worried About Running Out of Food in the Last Year: Never true    Ran Out of Food in the Last Year: Never true  Recent Concern: Food Insecurity - Food Insecurity Present (05/15/2022)   Hunger Vital Sign    Worried About Running Out of Food in the Last Year: Often true    Ran Out of Food in the Last Year: Often true  Transportation Needs: No Transportation Needs (05/26/2022)   PRAPARE - Administrator, Civil Service (Medical): No    Lack of Transportation (Non-Medical): No  Recent Concern: Transportation Needs - Unmet Transportation Needs (05/15/2022)   PRAPARE - Transportation    Lack of Transportation (Medical): Yes    Lack of Transportation (Non-Medical): Yes  Physical Activity: Unknown (05/15/2022)   Exercise Vital Sign    Days of Exercise per Week: 0 days    Minutes of Exercise per Session: Not on file  Stress: Stress Concern Present (05/15/2022)   [employer] of Occupational Health - Occupational Stress Questionnaire    Feeling of Stress : Very much  Social Connections: Socially Isolated (05/15/2022)   Social Connection and Isolation Panel [NHANES]    Frequency of Communication with Friends and Family: More than three times a week    Frequency of Social Gatherings with Friends and Family: Once a week    Attends Religious Services: Never     Database administrator or Organizations: No    Attends Engineer, structural: Not on file    Marital Status: Divorced  Intimate Partner Violence: Not At Risk (05/26/2022)   Humiliation, Afraid, Rape, and Kick questionnaire    Fear of Current or Ex-Partner: No    Emotionally Abused: No    Physically Abused: No    Sexually Abused: No   Family History  Problem Relation Age of Onset   Cancer Mother        T cell lymphoma   Hypertension Mother    Hypercalcemia Mother    OCD Father    Anxiety disorder Father    Coronary artery disease Father    Congestive Heart Failure Father    Asthma Father    Hypercalcemia Father    Hypertension Father    Colon polyps Father    Depression Maternal Grandmother    Diabetes Paternal Grandmother    Stomach cancer Neg Hx    Esophageal cancer Neg Hx    Colon cancer Neg Hx    Rectal cancer Neg Hx    Wt 62.9 kg (138 lb 9.6 oz)   LMP 04/21/2022 (Exact Date)   SpO2 100%   BMI 25.35 kg/m   Wt Readings from Last 3 Encounters:  06/08/22 62.9 kg (138 lb 9.6 oz)  06/07/22 62.1 kg (137 lb)  06/01/22 65.3 kg (144 lb)  PHYSICAL EXAM: General:  Well appearing. Ambulated into clinic. HEENT: normal Neck: supple. no JVD. Carotids 2+ bilat; no bruits.  Cor: PMI nondisplaced. Regular rate & rhythm. No rubs, gallops or murmurs. Tunneled PICC right upper chest. Lungs: clear Abdomen: soft, nontender, nondistended.  Extremities: no cyanosis, clubbing, rash, edema Neuro: alert & orientedx3. Affect pleasant   SR on bedside telemetry in clinic today  ASSESSMENT & PLAN:  1. 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  - Echo 09/2020 EF 20-25%.  - LHC with 09/2020 with severe coronary disease. Total occlusion of the proximal LAD, moderate stenosis of the intermediate branch, and severe ostial circumflex in-stent restenosis.  - Echo 12/27/20  EF 40-45% RV mildly HK - Echo 12/23 EF 45-50%  - Echo  04/24: EF 20-25% w/ AK of anteroseptal wall and apex, RV severely reduced. Also w/ Hs trop elevation peaking at 5,048. LHC w/ stable coronary disease and 2/2 patent bypass grafts. Suspect drop in EF likely stress induced in setting of critical illness/DKA - RHC c/w low output, LV dominant shock (RA 4, PAP 44/18, PCWP 24, LVEDP 24, FICK CO 3.04, CI 1.85, Co-ox 46%).  - Failed dobutamine wean. Stabilized on Dobutamine 3 mcg. Has [facility] home infusion and gets PICC dressing changes/labs at [facility] - Volume status stable. Use lasix as needed.  - Continue digoxin 0.125 daily.  - No BB with recent shock. - Stop spiro d/t orthostatic hypotension  - Continue 12.5 mg losartan dialy.   - not candidate for SGLT2i w/ Type 1DM and DKA  - Now has end-stage HF d/t severe ICM. No options for revascularization. RV reduced on echo but PAPI okay. Starting workup for transplant with option for LVAD as back-up.  Will need blood glucose well-controlled, hgbA1c recently 7.8.  Has appointment with [facility]. Allena Katz at [facility] next week. As above, think she will be inotrope-dependent.   - UDS recently + Tetrahydrocannabinol. Will need monthly UDS.    2. CAD/recent NSTEMI  - h/o CABG x 2 2020, LIMA-LAD, RIMA-LCx - LHC 04/24 w/ stable disease, 2/2 patent grafts. No culprit lesions  - No chest pain.  - continue medical management  - Does not tolerate statins. Has praluent at home but had not used recently.   3. Type I DM - Recent DKA developed after insulin pump malfunctioned  - Hgb A1C  7.8 on 5/1.    4. AKI  - Scr peaked to 1.7 during recent admit, in setting of DKA and low output HF - Baseline Scr < 1 - Labs today  5. Orthostatic hypotension - Stopped spiro as above.  - Suspect volume depletion - Given Rx for TED hose  Follow-up: 2 months with Dr. Gala Romney  [facility], Dalbert Garnet, PA-C 06/08/22   Patient seen and examined with the above-signed Advanced Practice Provider and/or Housestaff. I personally reviewed  laboratory data, imaging studies and relevant notes. I independently examined the patient and formulated the important aspects of the plan. I have edited the note to reflect any of my changes or salient points. I have personally discussed the plan with the patient and/or family.  She is doing well on dobutamine post-hospitalization. Now with NYHA II-III symptoms. Volume status ok. Main issue is orthostasis. No problems with PICC. No anginal symptoms  General:  Well appearing. No resp difficulty HEENT: normal Neck: supple. no JVD. Carotids 2+ bilat; no bruits. No lymphadenopathy or thryomegaly appreciated. Cor: PICC site ok Regular rate &  rhythm. No rubs, gallops or murmurs. Lungs: clear Abdomen: soft, nontender, nondistended. No hepatosplenomegaly. No bruits or masses. Good bowel sounds. Extremities: no cyanosis, clubbing, rash, edema Neuro: alert & orientedx3, cranial nerves grossly intact. moves all 4 extremities w/o difficulty. Affect pleasant  She is stable on DBA. Will stop spiro due to orthostasis. Long talk with her and her support team about  the transplant evaluation process at [facility]. They are eager to proceed. Will get labs today.  Psychosocial and VAD team evals also completed today.   Total time spent 45 minutes. Over half that time spent discussing above.   Arvilla Meres, MD  2:27 PM

## 2022-06-08 NOTE — Research (Signed)
Patient had visit 2 which is the final visit for the ANALOG Study. Patient only used the device for 2-3 weeks of the entire study.  Does not qualify for compensation since she did not use the device.     Jennifer Hogan, Research Coordinator 06/08/2022  14:30 pm

## 2022-06-09 ENCOUNTER — Other Ambulatory Visit: Payer: Self-pay

## 2022-06-09 ENCOUNTER — Other Ambulatory Visit: Payer: Self-pay | Admitting: Family Medicine

## 2022-06-09 ENCOUNTER — Other Ambulatory Visit (HOSPITAL_COMMUNITY): Payer: Self-pay

## 2022-06-09 MED ORDER — LEVOTHYROXINE SODIUM 100 MCG PO TABS
100.0000 ug | ORAL_TABLET | Freq: Every day | ORAL | 0 refills | Status: DC
  Filled 2022-06-09: qty 30, 30d supply, fill #0
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
## 2022-06-09 NOTE — Telephone Encounter (Signed)
Requested medications are due for refill today.  yes  Requested medications are on the active medications list.  yes  Last refill. 04/10/2022 #30 0 rf  Future visit scheduled.   yes  Notes to clinic.   Abnormal labs.    Requested Prescriptions  Pending Prescriptions Disp Refills   levothyroxine (SYNTHROID) 100 MCG tablet 30 tablet 0    Sig: Take 1 tablet (100 mcg total) by mouth daily before breakfast.     Endocrinology:  Hypothyroid Agents Failed - 06/09/2022  2:39 PM      Failed - TSH in normal range and within 360 days    TSH  Date Value Ref Range Status  05/30/2022 13.002 (H) 0.350 - 4.500 uIU/mL Final    Comment:    Performed by a 3rd Generation assay with a functional sensitivity of <=0.01 uIU/mL. Performed at [facility], [address], [place], [place] [address]   03/25/2021 0.737 0.450 - 4.500 uIU/mL Final         Passed - Valid encounter within last 12 months    Recent Outpatient Visits           3 weeks ago Pain of right thumb   [facility], Shea Stakes, NP   3 months ago Hospital discharge follow-up   [facility] [place], [place] W, NP   5 months ago Coronary artery disease involving native coronary artery of native heart without angina pectoris   [facility] Marcine Matar, MD   8 months ago Anxiety and depression   [facility] [name], NP   11 months ago Hospital discharge follow-up   [facility] [name], NP       Future Appointments             In 1 week Sharon Seller, Marzella Schlein, PA-C [facility]   In 2 months [name], NP [facility]

## 2022-06-12 ENCOUNTER — Other Ambulatory Visit: Payer: Self-pay

## 2022-06-12 ENCOUNTER — Encounter (HOSPITAL_COMMUNITY): Payer: Self-pay | Admitting: Internal Medicine

## 2022-06-13 ENCOUNTER — Other Ambulatory Visit: Payer: Self-pay

## 2022-06-14 ENCOUNTER — Other Ambulatory Visit: Payer: Self-pay

## 2022-06-14 ENCOUNTER — Encounter: Payer: Self-pay | Admitting: Internal Medicine

## 2022-06-15 ENCOUNTER — Encounter (HOSPITAL_COMMUNITY)
Admission: RE | Admit: 2022-06-15 | Discharge: 2022-06-15 | Disposition: A | Discharge status: Home / Self Care | Payer: Medicare Other | Source: Ambulatory Visit | Attending: Cardiology | Admitting: Cardiology

## 2022-06-15 DIAGNOSIS — I255 Ischemic cardiomyopathy: Secondary | ICD-10-CM | POA: Diagnosis present

## 2022-06-15 LAB — COMPREHENSIVE METABOLIC PANEL
ALT: 27 U/L (ref 0–44)
AST: 38 U/L (ref 15–41)
Albumin: 3.6 g/dL (ref 3.5–5.0)
Alkaline Phosphatase: 98 U/L (ref 38–126)
Anion gap: 13 (ref 5–15)
BUN: 17 mg/dL (ref 6–20)
CO2: 23 mmol/L (ref 22–32)
Calcium: 9.3 mg/dL (ref 8.9–10.3)
Chloride: 100 mmol/L (ref 98–111)
Creatinine, Ser: 1.09 mg/dL — ABNORMAL HIGH (ref 0.44–1.00)
GFR, Estimated: >60 mL/min (ref >60)
Glucose, Bld: 139 mg/dL — ABNORMAL HIGH (ref 70–99)
Potassium: 3.2 mmol/L — ABNORMAL LOW (ref 3.5–5.1)
Sodium: 136 mmol/L (ref 135–145)
Total Bilirubin: 0.5 mg/dL (ref 0.3–1.2)
Total Protein: 7.3 g/dL (ref 6.5–8.1)

## 2022-06-15 LAB — MAGNESIUM: Magnesium: 2 mg/dL (ref 1.7–2.4)

## 2022-06-15 LAB — CBC
HCT: 32.9 % — ABNORMAL LOW (ref 36.0–46.0)
Hemoglobin: 10.6 g/dL — ABNORMAL LOW (ref 12.0–15.0)
MCH: 30 pg (ref 26.0–34.0)
MCHC: 32.2 g/dL (ref 30.0–36.0)
MCV: 93.2 fL (ref 80.0–100.0)
Platelets: 407 10*3/uL — ABNORMAL HIGH (ref 150–400)
RBC: 3.53 MIL/uL — ABNORMAL LOW (ref 3.87–5.11)
RDW: 14.2 % (ref 11.5–15.5)
WBC: 5.3 10*3/uL (ref 4.0–10.5)
nRBC: 0 % (ref 0.0–0.2)

## 2022-06-20 LAB — FACTOR 5 LEIDEN

## 2022-06-21 ENCOUNTER — Inpatient Hospital Stay: Payer: Medicaid Other | Admitting: Physician Assistant

## 2022-06-22 ENCOUNTER — Ambulatory Visit (HOSPITAL_COMMUNITY)
Admission: RE | Admit: 2022-06-22 | Discharge: 2022-06-22 | Disposition: A | Discharge status: Home / Self Care | Payer: Medicare Other | Source: Ambulatory Visit | Attending: Cardiology | Admitting: Cardiology

## 2022-06-22 DIAGNOSIS — I255 Ischemic cardiomyopathy: Secondary | ICD-10-CM | POA: Diagnosis present

## 2022-06-22 LAB — CBC
HCT: 33.8 % — ABNORMAL LOW (ref 36.0–46.0)
Hemoglobin: 10.7 g/dL — ABNORMAL LOW (ref 12.0–15.0)
MCH: 29.9 pg (ref 26.0–34.0)
MCHC: 31.7 g/dL (ref 30.0–36.0)
MCV: 94.4 fL (ref 80.0–100.0)
Platelets: 481 10*3/uL — ABNORMAL HIGH (ref 150–400)
RBC: 3.58 MIL/uL — ABNORMAL LOW (ref 3.87–5.11)
RDW: 14.1 % (ref 11.5–15.5)
WBC: 5 10*3/uL (ref 4.0–10.5)
nRBC: 0 % (ref 0.0–0.2)

## 2022-06-22 LAB — COMPREHENSIVE METABOLIC PANEL
ALT: 35 U/L (ref 0–44)
AST: 39 U/L (ref 15–41)
Albumin: 3.6 g/dL (ref 3.5–5.0)
Alkaline Phosphatase: 141 U/L — ABNORMAL HIGH (ref 38–126)
Anion gap: 10 (ref 5–15)
BUN: 16 mg/dL (ref 6–20)
CO2: 24 mmol/L (ref 22–32)
Calcium: 9.2 mg/dL (ref 8.9–10.3)
Chloride: 102 mmol/L (ref 98–111)
Creatinine, Ser: 0.93 mg/dL (ref 0.44–1.00)
GFR, Estimated: >60 mL/min (ref >60)
Glucose, Bld: 185 mg/dL — ABNORMAL HIGH (ref 70–99)
Potassium: 4.1 mmol/L (ref 3.5–5.1)
Sodium: 136 mmol/L (ref 135–145)
Total Bilirubin: 0.5 mg/dL (ref 0.3–1.2)
Total Protein: 7.3 g/dL (ref 6.5–8.1)

## 2022-06-22 LAB — MAGNESIUM: Magnesium: 2.2 mg/dL (ref 1.7–2.4)

## 2022-06-25 ENCOUNTER — Other Ambulatory Visit (HOSPITAL_COMMUNITY): Payer: Self-pay | Admitting: Cardiology

## 2022-06-26 ENCOUNTER — Other Ambulatory Visit: Payer: Self-pay

## 2022-06-27 ENCOUNTER — Emergency Department (HOSPITAL_COMMUNITY): Payer: Medicare Other

## 2022-06-27 ENCOUNTER — Observation Stay (HOSPITAL_COMMUNITY)
Admission: EM | Admit: 2022-06-27 | Discharge: 2022-06-28 | Disposition: A | Discharge status: Home / Self Care | Payer: Medicare Other | Attending: Internal Medicine | Admitting: Internal Medicine

## 2022-06-27 ENCOUNTER — Other Ambulatory Visit: Payer: Self-pay

## 2022-06-27 ENCOUNTER — Other Ambulatory Visit (HOSPITAL_COMMUNITY): Payer: Self-pay | Admitting: *Deleted

## 2022-06-27 ENCOUNTER — Encounter (HOSPITAL_COMMUNITY): Payer: Self-pay | Admitting: Emergency Medicine

## 2022-06-27 DIAGNOSIS — I2510 Atherosclerotic heart disease of native coronary artery without angina pectoris: Secondary | ICD-10-CM | POA: Insufficient documentation

## 2022-06-27 DIAGNOSIS — Z86718 Personal history of other venous thrombosis and embolism: Secondary | ICD-10-CM | POA: Diagnosis not present

## 2022-06-27 DIAGNOSIS — Z794 Long term (current) use of insulin: Secondary | ICD-10-CM | POA: Insufficient documentation

## 2022-06-27 DIAGNOSIS — R112 Nausea with vomiting, unspecified: Secondary | ICD-10-CM | POA: Diagnosis present

## 2022-06-27 DIAGNOSIS — Z87891 Personal history of nicotine dependence: Secondary | ICD-10-CM | POA: Diagnosis not present

## 2022-06-27 DIAGNOSIS — J45909 Unspecified asthma, uncomplicated: Secondary | ICD-10-CM | POA: Insufficient documentation

## 2022-06-27 DIAGNOSIS — F411 Generalized anxiety disorder: Secondary | ICD-10-CM | POA: Diagnosis present

## 2022-06-27 DIAGNOSIS — D649 Anemia, unspecified: Secondary | ICD-10-CM | POA: Diagnosis present

## 2022-06-27 DIAGNOSIS — Z79899 Other long term (current) drug therapy: Secondary | ICD-10-CM | POA: Insufficient documentation

## 2022-06-27 DIAGNOSIS — E039 Hypothyroidism, unspecified: Secondary | ICD-10-CM | POA: Diagnosis not present

## 2022-06-27 DIAGNOSIS — E871 Hypo-osmolality and hyponatremia: Secondary | ICD-10-CM | POA: Diagnosis not present

## 2022-06-27 DIAGNOSIS — I255 Ischemic cardiomyopathy: Secondary | ICD-10-CM | POA: Diagnosis present

## 2022-06-27 DIAGNOSIS — I110 Hypertensive heart disease with heart failure: Secondary | ICD-10-CM | POA: Diagnosis not present

## 2022-06-27 DIAGNOSIS — E1165 Type 2 diabetes mellitus with hyperglycemia: Secondary | ICD-10-CM | POA: Diagnosis present

## 2022-06-27 DIAGNOSIS — Z955 Presence of coronary angioplasty implant and graft: Secondary | ICD-10-CM | POA: Diagnosis not present

## 2022-06-27 DIAGNOSIS — E1065 Type 1 diabetes mellitus with hyperglycemia: Secondary | ICD-10-CM | POA: Diagnosis not present

## 2022-06-27 DIAGNOSIS — F332 Major depressive disorder, recurrent severe without psychotic features: Secondary | ICD-10-CM | POA: Diagnosis present

## 2022-06-27 DIAGNOSIS — I5022 Chronic systolic (congestive) heart failure: Secondary | ICD-10-CM

## 2022-06-27 DIAGNOSIS — I509 Heart failure, unspecified: Secondary | ICD-10-CM | POA: Insufficient documentation

## 2022-06-27 DIAGNOSIS — E1010 Type 1 diabetes mellitus with ketoacidosis without coma: Secondary | ICD-10-CM | POA: Insufficient documentation

## 2022-06-27 DIAGNOSIS — Z7901 Long term (current) use of anticoagulants: Secondary | ICD-10-CM | POA: Insufficient documentation

## 2022-06-27 DIAGNOSIS — I82409 Acute embolism and thrombosis of unspecified deep veins of unspecified lower extremity: Secondary | ICD-10-CM | POA: Diagnosis present

## 2022-06-27 LAB — CBC WITH DIFFERENTIAL/PLATELET
Abs Immature Granulocytes: 0.01 10*3/uL (ref 0.00–0.07)
Basophils Absolute: 0.1 10*3/uL (ref 0.0–0.1)
Basophils Relative: 1 %
Eosinophils Absolute: 0.3 10*3/uL (ref 0.0–0.5)
Eosinophils Relative: 4 %
HCT: 33.8 % — ABNORMAL LOW (ref 36.0–46.0)
Hemoglobin: 10.8 g/dL — ABNORMAL LOW (ref 12.0–15.0)
Immature Granulocytes: 0 %
Lymphocytes Relative: 30 %
Lymphs Abs: 1.9 10*3/uL (ref 0.7–4.0)
MCH: 29.3 pg (ref 26.0–34.0)
MCHC: 32 g/dL (ref 30.0–36.0)
MCV: 91.6 fL (ref 80.0–100.0)
Monocytes Absolute: 0.4 10*3/uL (ref 0.1–1.0)
Monocytes Relative: 6 %
Neutro Abs: 3.7 10*3/uL (ref 1.7–7.7)
Neutrophils Relative %: 59 %
Platelets: 538 10*3/uL — ABNORMAL HIGH (ref 150–400)
RBC: 3.69 MIL/uL — ABNORMAL LOW (ref 3.87–5.11)
RDW: 13.8 % (ref 11.5–15.5)
WBC: 6.3 10*3/uL (ref 4.0–10.5)
nRBC: 0 % (ref 0.0–0.2)

## 2022-06-27 MED ORDER — APIXABAN 5 MG PO TABS
5.0000 mg | ORAL_TABLET | Freq: Two times a day (BID) | ORAL | 11 refills | Status: DC
Start: 2022-06-27 — End: 2022-11-09
  Filled 2022-06-27: qty 60, 30d supply, fill #0
  Filled 2022-07-28: qty 60, 30d supply, fill #1
  Filled 2022-09-12: qty 60, 30d supply, fill #2
  Filled 2022-10-09: qty 60, 30d supply, fill #3

## 2022-06-27 MED ORDER — ONDANSETRON HCL 4 MG/2ML IJ SOLN
4.0000 mg | Freq: Once | INTRAMUSCULAR | Status: AC
  Administered 2022-06-27: 4 mg via INTRAVENOUS
  Filled 2022-06-27: qty 2

## 2022-06-27 NOTE — ED Triage Notes (Signed)
Pt c/o concern for elevated temp 99, chills, nausea, and left sided upper back pain. Endorses chronic ongoing SOB. Has PICC line right upper chest. No cough.

## 2022-06-27 NOTE — Telephone Encounter (Signed)
She will stay on the 5 mg BID. So the 5 mg tablet should be ordered, not the starter pack.

## 2022-06-28 ENCOUNTER — Other Ambulatory Visit: Payer: Self-pay

## 2022-06-28 ENCOUNTER — Encounter (HOSPITAL_COMMUNITY): Payer: Self-pay | Admitting: Family Medicine

## 2022-06-28 DIAGNOSIS — F332 Major depressive disorder, recurrent severe without psychotic features: Secondary | ICD-10-CM | POA: Diagnosis not present

## 2022-06-28 DIAGNOSIS — I255 Ischemic cardiomyopathy: Secondary | ICD-10-CM | POA: Diagnosis not present

## 2022-06-28 DIAGNOSIS — I2583 Coronary atherosclerosis due to lipid rich plaque: Secondary | ICD-10-CM

## 2022-06-28 DIAGNOSIS — E039 Hypothyroidism, unspecified: Secondary | ICD-10-CM | POA: Diagnosis not present

## 2022-06-28 DIAGNOSIS — E1065 Type 1 diabetes mellitus with hyperglycemia: Secondary | ICD-10-CM | POA: Diagnosis not present

## 2022-06-28 DIAGNOSIS — I2510 Atherosclerotic heart disease of native coronary artery without angina pectoris: Secondary | ICD-10-CM

## 2022-06-28 DIAGNOSIS — E1165 Type 2 diabetes mellitus with hyperglycemia: Secondary | ICD-10-CM | POA: Diagnosis present

## 2022-06-28 DIAGNOSIS — I82409 Acute embolism and thrombosis of unspecified deep veins of unspecified lower extremity: Secondary | ICD-10-CM | POA: Diagnosis present

## 2022-06-28 DIAGNOSIS — I8290 Acute embolism and thrombosis of unspecified vein: Secondary | ICD-10-CM

## 2022-06-28 DIAGNOSIS — F411 Generalized anxiety disorder: Secondary | ICD-10-CM

## 2022-06-28 LAB — COMPREHENSIVE METABOLIC PANEL
ALT: 28 U/L (ref 0–44)
AST: 29 U/L (ref 15–41)
Albumin: 4.2 g/dL (ref 3.5–5.0)
Alkaline Phosphatase: 129 U/L — ABNORMAL HIGH (ref 38–126)
Anion gap: 15 (ref 5–15)
BUN: 23 mg/dL — ABNORMAL HIGH (ref 6–20)
CO2: 20 mmol/L — ABNORMAL LOW (ref 22–32)
Calcium: 9.7 mg/dL (ref 8.9–10.3)
Chloride: 96 mmol/L — ABNORMAL LOW (ref 98–111)
Creatinine, Ser: 1.15 mg/dL — ABNORMAL HIGH (ref 0.44–1.00)
GFR, Estimated: 57 mL/min — ABNORMAL LOW (ref >60)
Glucose, Bld: 380 mg/dL — ABNORMAL HIGH (ref 70–99)
Potassium: 4.8 mmol/L (ref 3.5–5.1)
Sodium: 131 mmol/L — ABNORMAL LOW (ref 135–145)
Total Bilirubin: 0.7 mg/dL (ref 0.3–1.2)
Total Protein: 8.1 g/dL (ref 6.5–8.1)

## 2022-06-28 LAB — CBG MONITORING, ED
Glucose-Capillary: 130 mg/dL — ABNORMAL HIGH (ref 70–99)
Glucose-Capillary: 137 mg/dL — ABNORMAL HIGH (ref 70–99)
Glucose-Capillary: 171 mg/dL — ABNORMAL HIGH (ref 70–99)
Glucose-Capillary: 172 mg/dL — ABNORMAL HIGH (ref 70–99)
Glucose-Capillary: 175 mg/dL — ABNORMAL HIGH (ref 70–99)
Glucose-Capillary: 208 mg/dL — ABNORMAL HIGH (ref 70–99)
Glucose-Capillary: 220 mg/dL — ABNORMAL HIGH (ref 70–99)
Glucose-Capillary: 224 mg/dL — ABNORMAL HIGH (ref 70–99)
Glucose-Capillary: 381 mg/dL — ABNORMAL HIGH (ref 70–99)
Glucose-Capillary: 406 mg/dL — ABNORMAL HIGH (ref 70–99)

## 2022-06-28 LAB — BASIC METABOLIC PANEL
Anion gap: 8 (ref 5–15)
BUN: 25 mg/dL — ABNORMAL HIGH (ref 6–20)
CO2: 22 mmol/L (ref 22–32)
Calcium: 9.3 mg/dL (ref 8.9–10.3)
Chloride: 103 mmol/L (ref 98–111)
Creatinine, Ser: 1.06 mg/dL — ABNORMAL HIGH (ref 0.44–1.00)
GFR, Estimated: >60 mL/min (ref >60)
Glucose, Bld: 233 mg/dL — ABNORMAL HIGH (ref 70–99)
Potassium: 4.4 mmol/L (ref 3.5–5.1)
Sodium: 133 mmol/L — ABNORMAL LOW (ref 135–145)

## 2022-06-28 LAB — TROPONIN I (HIGH SENSITIVITY)
Troponin I (High Sensitivity): 10 ng/L (ref <18)
Troponin I (High Sensitivity): 9 ng/L (ref <18)

## 2022-06-28 LAB — BRAIN NATRIURETIC PEPTIDE: B Natriuretic Peptide: 17.3 pg/mL (ref 0.0–100.0)

## 2022-06-28 LAB — LACTIC ACID, PLASMA
Lactic Acid, Venous: 2.4 mmol/L (ref 0.5–1.9)
Lactic Acid, Venous: 2.4 mmol/L (ref 0.5–1.9)

## 2022-06-28 LAB — LIPASE, BLOOD: Lipase: 30 U/L (ref 11–51)

## 2022-06-28 LAB — I-STAT BETA HCG BLOOD, ED (MC, WL, AP ONLY): I-stat hCG, quantitative: <5 m[IU]/mL (ref <5)

## 2022-06-28 LAB — BETA-HYDROXYBUTYRIC ACID: Beta-Hydroxybutyric Acid: 2.82 mmol/L — ABNORMAL HIGH (ref 0.05–0.27)

## 2022-06-28 MED ORDER — ONDANSETRON HCL 4 MG/2ML IJ SOLN: 4.0000 mg | Freq: Four times a day (QID) | INTRAMUSCULAR | Status: DC | PRN

## 2022-06-28 MED ORDER — FLUTICASONE PROPIONATE 50 MCG/ACT NA SUSP
1.0000 | Freq: Every day | NASAL | Status: DC
  Administered 2022-06-28: 1 via NASAL
  Filled 2022-06-28: qty 16

## 2022-06-28 MED ORDER — LACTATED RINGERS IV SOLN: INTRAVENOUS | Status: DC

## 2022-06-28 MED ORDER — LEVOTHYROXINE SODIUM 100 MCG PO TABS
100.0000 ug | ORAL_TABLET | Freq: Every day | ORAL | Status: DC
  Administered 2022-06-28: 100 ug via ORAL
  Filled 2022-06-28: qty 1

## 2022-06-28 MED ORDER — DEXTROSE IN LACTATED RINGERS 5 % IV SOLN: INTRAVENOUS | Status: DC

## 2022-06-28 MED ORDER — DOBUTAMINE-DEXTROSE 4-5 MG/ML-% IV SOLN
3.0000 ug/kg/min | INTRAVENOUS | Status: DC
  Administered 2022-06-28: 3 ug/kg/min via INTRAVENOUS
  Filled 2022-06-28: qty 250

## 2022-06-28 MED ORDER — DEXTROSE 50 % IV SOLN: 0.0000 mL | INTRAVENOUS | Status: DC | PRN

## 2022-06-28 MED ORDER — MOMETASONE FURO-FORMOTEROL FUM 100-5 MCG/ACT IN AERO
2.0000 | INHALATION_SPRAY | Freq: Two times a day (BID) | RESPIRATORY_TRACT | Status: DC
  Administered 2022-06-28: 2 via RESPIRATORY_TRACT
  Filled 2022-06-28: qty 8.8

## 2022-06-28 MED ORDER — HYDROMORPHONE HCL 1 MG/ML IJ SOLN
1.0000 mg | Freq: Once | INTRAMUSCULAR | Status: AC
  Administered 2022-06-28: 1 mg via INTRAVENOUS
  Filled 2022-06-28: qty 1

## 2022-06-28 MED ORDER — BUDESONIDE 0.25 MG/2ML IN SUSP: 0.2500 mg | Freq: Every day | RESPIRATORY_TRACT | Status: DC | PRN

## 2022-06-28 MED ORDER — ASPIRIN 81 MG PO TBEC
81.0000 mg | DELAYED_RELEASE_TABLET | Freq: Every day | ORAL | Status: DC
  Administered 2022-06-28: 81 mg via ORAL
  Filled 2022-06-28: qty 1

## 2022-06-28 MED ORDER — BUPROPION HCL ER (XL) 150 MG PO TB24
150.0000 mg | ORAL_TABLET | Freq: Every day | ORAL | Status: DC
  Administered 2022-06-28: 150 mg via ORAL
  Filled 2022-06-28: qty 1

## 2022-06-28 MED ORDER — SODIUM CHLORIDE 0.9 % IV SOLN
12.5000 mg | Freq: Once | INTRAVENOUS | Status: AC
  Administered 2022-06-28: 12.5 mg via INTRAVENOUS
  Filled 2022-06-28: qty 12.5

## 2022-06-28 MED ORDER — ALBUTEROL SULFATE (2.5 MG/3ML) 0.083% IN NEBU: 3.0000 mL | INHALATION_SOLUTION | Freq: Four times a day (QID) | RESPIRATORY_TRACT | Status: DC | PRN

## 2022-06-28 MED ORDER — APIXABAN 5 MG PO TABS
5.0000 mg | ORAL_TABLET | Freq: Two times a day (BID) | ORAL | Status: DC
  Administered 2022-06-28: 5 mg via ORAL
  Filled 2022-06-28: qty 1

## 2022-06-28 MED ORDER — PANTOPRAZOLE SODIUM 40 MG PO TBEC
40.0000 mg | DELAYED_RELEASE_TABLET | Freq: Two times a day (BID) | ORAL | Status: DC
  Administered 2022-06-28: 40 mg via ORAL
  Filled 2022-06-28: qty 1

## 2022-06-28 MED ORDER — DULOXETINE HCL 30 MG PO CPEP: 30.0000 mg | ORAL_CAPSULE | Freq: Every day | ORAL | Status: DC

## 2022-06-28 MED ORDER — LOSARTAN POTASSIUM 25 MG PO TABS
12.5000 mg | ORAL_TABLET | Freq: Every day | ORAL | Status: DC
  Administered 2022-06-28: 12.5 mg via ORAL
  Filled 2022-06-28: qty 0.5

## 2022-06-28 MED ORDER — HEPARIN SOD (PORK) LOCK FLUSH 100 UNIT/ML IV SOLN
500.0000 [IU] | Freq: Once | INTRAVENOUS | Status: AC
  Administered 2022-06-28: 500 [IU]
  Filled 2022-06-28: qty 5

## 2022-06-28 MED ORDER — PANTOPRAZOLE SODIUM 40 MG PO TBEC: 40.0000 mg | DELAYED_RELEASE_TABLET | Freq: Two times a day (BID) | ORAL | Status: DC

## 2022-06-28 MED ORDER — METOCLOPRAMIDE HCL 5 MG/ML IJ SOLN
10.0000 mg | Freq: Once | INTRAMUSCULAR | Status: AC
  Administered 2022-06-28: 10 mg via INTRAVENOUS
  Filled 2022-06-28: qty 2

## 2022-06-28 MED ORDER — MONTELUKAST SODIUM 10 MG PO TABS: 10.0000 mg | ORAL_TABLET | Freq: Every day | ORAL | Status: DC

## 2022-06-28 MED ORDER — DIGOXIN 125 MCG PO TABS
0.1250 mg | ORAL_TABLET | Freq: Every day | ORAL | Status: DC
  Administered 2022-06-28: 0.125 mg via ORAL
  Filled 2022-06-28: qty 1

## 2022-06-28 MED ORDER — INSULIN REGULAR(HUMAN) IN NACL 100-0.9 UT/100ML-% IV SOLN
INTRAVENOUS | Status: DC
  Administered 2022-06-28: 8 [IU]/h via INTRAVENOUS
  Filled 2022-06-28: qty 100

## 2022-06-28 NOTE — Discharge Summary (Signed)
Physician Discharge Summary  Jennifer Hogan:[id number] DOB: 18-Aug-1970 DOA: 06/27/2022  PCP: [name], NP  Admit date: 06/27/2022 Discharge date: 06/28/2022  Admitted From: Home Disposition:  Home  Recommendations for Outpatient Follow-up:  Follow up with PCP in 1-2 weeks Please obtain BMP/CBC in one week   Home Health:nO Equipment/Devices:None  Discharge Condition:Stable CODE STATUS:Full Diet recommendation: Heart Healthy   Brief/Interim Summary: 51 y.o. female past medical history of diabetes mellitus type 1 hypothyroidism left IJ DVT on Eliquis severe ischemic cardiomyopathy now end-stage on dobutamine infusion comes in with nausea vomiting and chills, blood glucose was noted to be 400 creatinine 1.1 lactic acid 2.4.  She relates she did not realize that her insulin pump site had been dislodged accidentally.  Discharge Diagnoses:  Principal Problem:   Uncontrolled diabetes mellitus with hyperglycemia (HCC) Active Problems:   Hypothyroidism   GAD (generalized anxiety disorder)   MDD (major depressive disorder), recurrent episode, severe (HCC)   Anemia   Ischemic cardiomyopathy   Coronary artery disease due to lipid rich plaque   DVT (deep venous thrombosis) (HCC)  Uncontrolled diabetes mellitus type 2 with hyperglycemia: Likely due to inadvertently disconnecting from her insulin pump. A1c of 7.8 on admission, she was started on IV insulin and IV fluids. Her blood glucose improved. Family brought in her insulin pump equipment. She was connected back. Her acidosis resolved.  Ischemic cardiomyopathy/chronic diastolic heart failure: No changes made to her medication continue dobutamine and digoxin. Appears to be compensated.  Hyponatremia: Likely due to ischemic cardiomyopathy. Currently stable.  CAD: Continue aspirin.  Anxiety/depression: Continue Cymbalta Wellbutrin.  Hypothyroidism: Continue Synthroid   Discharge Instructions  Discharge  Instructions     Diet - low sodium heart healthy   Complete by: As directed    Increase activity slowly   Complete by: As directed       Allergies as of 06/28/2022       Reactions   Crestor [rosuvastatin Calcium] Other (See Comments)   Myalgias  Arthralgias    Lipitor [atorvastatin] Other (See Comments)   Myalgias    Monosodium Glutamate Nausea And Vomiting, Other (See Comments)   MSG - migraine   Zetia [ezetimibe] Other (See Comments)   Myalgias    Ciprofloxin Hcl [ciprofloxacin] Nausea And Vomiting, Other (See Comments)   Severe migraine   Food Color Red [red Dye] Diarrhea        Medication List     TAKE these medications    aspirin EC 81 MG tablet Take 1 tablet (81 mg total) by mouth daily. What changed: when to take this   Baqsimi One Pack 3 MG/DOSE Powd Generic drug: Glucagon Place 3 mg into the nose once as needed for up to 1 dose.   buPROPion 150 MG 24 hr tablet Commonly known as: Wellbutrin XL Take 1 tablet (150 mg total) by mouth every morning.   Dexcom G6 Sensor Misc Use as instructed to check glucose levels. Change every 10 days.   Dexcom G6 Transmitter Misc Use as instructed to check glucose levels. Change every 90 days.   digoxin 0.125 MG tablet Commonly known as: LANOXIN Take 1 tablet (0.125 mg total) by mouth daily.   diphenhydrAMINE 25 MG tablet Commonly known as: BENADRYL Take 37.5 mg by mouth at bedtime.   DOBUTamine 4-5 MG/ML-% infusion Commonly known as: DOBUTREX Inject 191.4 mcg/min into the vein continuous. Per [facility] home infusion   Dulera 100-5 MCG/ACT Aero Generic drug: mometasone-formoterol Inhale 2 puffs into the lungs 2 (  two) times daily.   DULoxetine 30 MG capsule Commonly known as: Cymbalta Take 1 capsule (30 mg total) by mouth at bedtime.   Eliquis 5 MG Tabs tablet Generic drug: apixaban Take 1 tablet (5 mg total) by mouth 2 (two) times daily.   fluticasone 50 MCG/ACT nasal spray Commonly known as: FLONASE Insert  1 spray each nostril daily for 3 days then use 2-3 times a week as needed What changed:  how much to take how to take this when to take this reasons to take this additional instructions   HumaLOG 100 UNIT/ML injection Generic drug: insulin lispro Inject 0.35 mLs (35 Units total) into the skin daily via pump. What changed:  when to take this additional instructions   levothyroxine 100 MCG tablet Commonly known as: SYNTHROID Take 1 tablet (100 mcg total) by mouth daily before breakfast.   losartan 25 MG tablet Commonly known as: COZAAR Take 1/2 tablet (12.5 mg total) by mouth daily.   meloxicam 15 MG tablet Commonly known as: MOBIC Take 15 mg by mouth daily as needed for pain.   metoCLOPramide 5 MG tablet Commonly known as: Reglan Take 1 tablet (5 mg total) by mouth every 8 (eight) hours as needed for nausea or vomiting.   montelukast 10 MG tablet Commonly known as: SINGULAIR Take 1 tablet (10 mg total) by mouth at bedtime.   multivitamin with minerals Tabs tablet Take 1 tablet by mouth daily with lunch.   nitroGLYCERIN 0.4 MG SL tablet Commonly known as: NITROSTAT Place 1 tablet (0.4 mg total) under the tongue every 5 (five) minutes as needed for chest pain.   Omnipod 5 G6 Pods (Gen 5) Misc Use every 3 (three) days.   Omnipod 5 G6 Intro (Gen 5) Kit Use as directed.   ondansetron 4 MG tablet Commonly known as: Zofran Take 1 tablet (4 mg total) by mouth daily as needed for nausea or vomiting.   pantoprazole 40 MG tablet Commonly known as: PROTONIX Take 1 tablet (40 mg total) by mouth 2 (two) times daily.   Praluent 150 MG/ML Soaj Generic drug: Alirocumab Inject 1 pen  into the skin every 14 (fourteen) days.   spironolactone 25 MG tablet Commonly known as: ALDACTONE Take 25 mg by mouth daily.   Ventolin HFA 108 (90 Base) MCG/ACT inhaler Generic drug: albuterol INHALE 2 PUFFS INTO THE LUNGS EVERY 6 (SIX) HOURS AS NEEDED FOR WHEEZING OR SHORTNESS OF  BREATH.        Allergies  Allergen Reactions   Crestor [Rosuvastatin Calcium] Other (See Comments)    Myalgias  Arthralgias    Lipitor [Atorvastatin] Other (See Comments)    Myalgias    Monosodium Glutamate Nausea And Vomiting and Other (See Comments)    MSG - migraine   Zetia [Ezetimibe] Other (See Comments)    Myalgias    Ciprofloxin Hcl [Ciprofloxacin] Nausea And Vomiting and Other (See Comments)    Severe migraine   Food Color Red [Red Dye] Diarrhea    Consultations: None  Procedures/Studies: DG Chest 2 View  Result Date: 06/27/2022 CLINICAL DATA:  Shortness of breath EXAM: CHEST - 2 VIEW COMPARISON:  Chest CT 05/30/2022.  Chest radiographs 05/22/2022 FINDINGS: Postoperative changes in the mediastinum. Right central venous catheter with tip over the mid SVC region. No pneumothorax. Heart size and pulmonary vascularity are normal. Lungs are clear. No pleural effusions. No pneumothorax. Mediastinal contours appear intact. Degenerative changes in the spine and shoulders. IMPRESSION: No active cardiopulmonary disease. Electronically Signed   By:  Burman Nieves M.D.   On: 06/27/2022 22:28   DG Orthopantogram  Result Date: 05/30/2022 CLINICAL DATA:  Preoperative exam. High risk surgery. EXAM: ORTHOPANTOGRAM/PANORAMIC COMPARISON:  None Available. FINDINGS: There are no periapical lucencies. No evident dental caries. Multiple dental fillings. IMPRESSION: No dental caries or periapical lucencies. Multiple dental fillings. Electronically Signed   By: Narda Rutherford M.D.   On: 05/30/2022 22:41   CT CHEST W CONTRAST  Result Date: 05/30/2022 CLINICAL DATA:  Metastatic disease evaluation. LVAD evaluation. EXAM: CT CHEST WITH CONTRAST TECHNIQUE: Multidetector CT imaging of the chest was performed during intravenous contrast administration. RADIATION DOSE REDUCTION: This exam was performed according to the departmental dose-optimization program which includes automated exposure control,  adjustment of the mA and/or kV according to patient size and/or use of iterative reconstruction technique. CONTRAST:  50mL OMNIPAQUE IOHEXOL 350 MG/ML SOLN COMPARISON:  Chest CT 09/30/2020 FINDINGS: Cardiovascular: There are intraluminal filling defects within the left internal jugular vein, series 3 images 2 through 12. Right internal jugular dialysis catheter tip in the lower SVC. Prior median sternotomy. The heart is normal in size. The thoracic aorta is normal in caliber. Calcifications /stents of the coronary arteries. Mild anterior pericardial thickening but no significant pericardial fluid. There is no evidence of central pulmonary embolus on this exam not tailored to pulmonary arteries assessment. Mediastinum/Nodes: Nonspecific wall thickening of the distal esophagus. No enlarged mediastinal, hilar, or axillary lymph nodes. No thyroid nodule. Lungs/Pleura: Minimal subsegmental bandlike opacities in the lower lobes, atelectasis or scarring. No pneumonia or focal airspace disease. No pulmonary nodule. No pleural effusion. The trachea and central airways are clear. Upper Abdomen: Moderate stool in the included colon. No acute upper abdominal findings. There is retained food in the stomach. Musculoskeletal: No focal bone lesion. Prior median sternotomy. Thoracic spondylosis with spurring. IMPRESSION: 1. Filling defects within the left internal jugular vein consistent with DVT. 2. Nonspecific wall thickening of the distal esophagus, can be seen with reflux or esophagitis. Recommend endoscopy if there is clinical concern for esophageal mass. 3. No evidence of metastatic disease in the thorax. These results will be called to the ordering clinician or representative by the Radiologist Assistant, and communication documented in the PACS or [employer]. Electronically Signed   By: Narda Rutherford M.D.   On: 05/30/2022 22:40   (Echo, Carotid, EGD, Colonoscopy, ERCP)    Subjective: None   Discharge  Exam: Vitals:   06/28/22 1030 06/28/22 1045  BP: 127/68 110/60  Pulse:  (!) 103  Resp: 12 15  Temp:    SpO2:  100%   Vitals:   06/28/22 1015 06/28/22 1026 06/28/22 1030 06/28/22 1045  BP: 129/80  127/68 110/60  Pulse: (!) 104 (!) 110  (!) 103  Resp: 19  12 15   Temp:      TempSrc:      SpO2: 100%   100%  Weight:      Height:        General: Pt is alert, awake, not in acute distress Cardiovascular: RRR, S1/S2 +, no rubs, no gallops Respiratory: CTA bilaterally, no wheezing, no rhonchi Abdominal: Soft, NT, ND, bowel sounds + Extremities: no edema, no cyanosis    The results of significant diagnostics from this hospitalization (including imaging, microbiology, ancillary and laboratory) are listed below for reference.     Microbiology: No results found for this or any previous visit (from the past 240 hour(s)).   Labs: BNP (last 3 results) Recent Labs    11/03/21 1151 05/22/22  1529 06/28/22 0029  BNP 127.9* >4,500.0* 17.3   Basic Metabolic Panel: Recent Labs  Lab 06/22/22 1105 06/27/22 2201 06/28/22 0924  NA 136 131* 133*  K 4.1 4.8 4.4  CL 102 96* 103  CO2 24 20* 22  GLUCOSE 185* 380* 233*  BUN 16 23* 25*  CREATININE 0.93 1.15* 1.06*  CALCIUM 9.2 9.7 9.3  MG 2.2  --   --    Liver Function Tests: Recent Labs  Lab 06/22/22 1105 06/27/22 2201  AST 39 29  ALT 35 28  ALKPHOS 141* 129*  BILITOT 0.5 0.7  PROT 7.3 8.1  ALBUMIN 3.6 4.2   Recent Labs  Lab 06/27/22 2201  LIPASE 30   No results for input(s): "AMMONIA" in the last 168 hours. CBC: Recent Labs  Lab 06/22/22 1105 06/27/22 2201  WBC 5.0 6.3  NEUTROABS  --  3.7  HGB 10.7* 10.8*  HCT 33.8* 33.8*  MCV 94.4 91.6  PLT 481* 538*   Cardiac Enzymes: No results for input(s): "CKTOTAL", "CKMB", "CKMBINDEX", "TROPONINI" in the last 168 hours. BNP: Invalid input(s): "POCBNP" CBG: Recent Labs  Lab 06/28/22 0459 06/28/22 0556 06/28/22 0655 06/28/22 0816 06/28/22 1047  GLUCAP 224*  220* 172* 171* 175*   D-Dimer No results for input(s): "DDIMER" in the last 72 hours. Hgb A1c No results for input(s): "HGBA1C" in the last 72 hours. Lipid Profile No results for input(s): "CHOL", "HDL", "LDLCALC", "TRIG", "CHOLHDL", "LDLDIRECT" in the last 72 hours. Thyroid function studies No results for input(s): "TSH", "T4TOTAL", "T3FREE", "THYROIDAB" in the last 72 hours.  Invalid input(s): "FREET3" Anemia work up No results for input(s): "VITAMINB12", "FOLATE", "FERRITIN", "TIBC", "IRON", "RETICCTPCT" in the last 72 hours. Urinalysis    Component Value Date/Time   COLORURINE YELLOW 05/20/2022 1923   APPEARANCEUR CLEAR 05/20/2022 1923   LABSPEC 1.023 05/20/2022 1923   PHURINE 5.5 05/20/2022 1923   GLUCOSEU >1,000 (A) 05/20/2022 1923   GLUCOSEU NEGATIVE 11/23/2015 0831   HGBUR NEGATIVE 05/20/2022 1923   BILIRUBINUR NEGATIVE 05/20/2022 1923   BILIRUBINUR neg 12/07/2015 0843   KETONESUR >80 (A) 05/20/2022 1923   PROTEINUR TRACE (A) 05/20/2022 1923   UROBILINOGEN 1.0 12/07/2015 0843   UROBILINOGEN 0.2 11/23/2015 0831   NITRITE NEGATIVE 05/20/2022 1923   LEUKOCYTESUR TRACE (A) 05/20/2022 1923   Sepsis Labs Recent Labs  Lab 06/22/22 1105 06/27/22 2201  WBC 5.0 6.3   Microbiology No results found for this or any previous visit (from the past 240 hour(s)).   SIGNED:   Marinda Elk, MD  [facility] 06/28/2022, 10:56 AM Pager   If 7PM-7AM, please contact night-coverage [email or web address] Password [email or web address]

## 2022-06-28 NOTE — Progress Notes (Signed)
[facility] PROGRESS NOTE    Progress Note  Jennifer Hogan  ZOX:[id number] DOB: Jul 18, 1970 DOA: 06/27/2022 PCP: [name], NP     Brief Narrative:   Jennifer Hogan is an 52 y.o. female past medical history of diabetes mellitus type 1 hypothyroidism left IJ DVT on Eliquis severe ischemic cardiomyopathy now end-stage on dobutamine infusion comes in with nausea vomiting and chills, blood glucose was noted to be 400 creatinine 1.1 lactic acid 2.4   Assessment/Plan:   Uncontrolled diabetes mellitus with hyperglycemia (HCC): Likely due to inadvertently disconnecting from the insulin pump A1c of 7.8 started on IV insulin infusion and IV fluids. Blood glucose this morning is 220 on an insulin drip. I have asked the patient asked the family to bring the equipment for the insulin pump. Basic metabolic panels pending this a.m. She will call her family to bring her the equipment to hook her back to her insulin pump.  Ischemic cardiomyopathy/chronic diastolic heart failure: With last EF of 20%: Currently on dobutamine infusion, continue digoxin. Appears to be compensated. Strict I's and O's.  Hyponatremia: Likely due to ischemic cardiomyopathy.  CAD: Asymptomatic continue aspirin.  Depression/anxiety: Continue Cymbalta and Wellbutrin.  Hypothyroidism: Continue Synthroid.  Asthma: Continue inhalers.     DVT prophylaxis: eliquis Family Communication:none Status is: Observation The patient remains OBS appropriate and will d/c before 2 midnights.    Code Status:     Code Status Orders  (From admission, onward)           Start     Ordered   06/28/22 0241  Full code  Continuous       Question:  By:  Answer:  Consent: discussion documented in EHR   06/28/22 0241           Code Status History     Date Active Date Inactive Code Status Order ID Comments User Context   05/21/2022 0209 06/01/2022 2331 Full Code [id number]  Lorin Glass, MD  Inpatient   03/23/2022 2007 03/24/2022 2002 Full Code [id number]  Carollee Herter, DO Inpatient   03/23/2022 1928 03/23/2022 2007 Full Code [id number]  Carollee Herter, DO Inpatient   01/13/2022 1952 01/15/2022 1935 Full Code [id number]  Orland Mustard, MD Inpatient   01/13/2022 1950 01/13/2022 1952 Full Code [id number]  Orland Mustard, MD Inpatient   05/30/2021 1750 06/01/2021 1520 Full Code [id number]  Emeline General, MD ED   05/11/2021 1014 05/17/2021 1445 Full Code [id number]  Clydie Braun, MD ED   01/02/2021 2239 01/03/2021 1838 Full Code [id number]  Carollee Herter, DO ED   01/02/2021 2143 01/02/2021 2239 Full Code [id number]  Carollee Herter, DO ED   09/30/2020 2252 10/06/2020 1727 Full Code [id number]  Eduard Clos, MD ED   05/01/2019 2134 05/02/2019 2257 Full Code [id number]  John Giovanni, MD ED   03/15/2019 1453 03/16/2019 1809 DNR [id number]  Lorenso Courier, MD ED   03/15/2019 1441 03/15/2019 1453 Full Code [id number]  Lorenso Courier, MD ED   11/28/2018 2206 12/02/2018 1728 Full Code [id number]  John Giovanni, MD ED   11/24/2018 0605 11/26/2018 1621 Full Code [id number]  Rometta Emery, MD ED   11/02/2018 0812 11/09/2018 1916 Full Code [id number]  Barrett, Joline Salt, PA-C Inpatient   10/31/2018 1458 11/02/2018 0811 Full Code [id number]  Kathleene Hazel, MD Inpatient   05/24/2018 1950 05/27/2018 1512 Full Code [id number]  Hillary Bow, DO ED   02/15/2018 0020 02/16/2018 1740 Full Code [id number]  Charlsie Quest, MD  ED   02/08/2018 0926 02/11/2018 1706 Full Code [id number]  Jonah Blue, MD Inpatient   02/08/2018 0821 02/08/2018 0926 Full Code [id number]  Lyn Records, MD Inpatient   12/31/2017 1329 01/02/2018 1444 Full Code [id number]  Jonah Blue, MD ED   12/06/2017 1717 12/07/2017 1814 Full Code [id number]  Alessandra Bevels, MD ED   03/17/2017 2023 03/22/2017 1346 Full Code [id number]  Pearson Grippe, MD ED   03/16/2017 0001 03/17/2017 0045 Full Code [id number]  Eduard Clos, MD Inpatient         IV Access:    Peripheral IV   Procedures and diagnostic studies:   DG Chest 2 View  Result Date: 06/27/2022 CLINICAL DATA:  Shortness of breath EXAM: CHEST - 2 VIEW COMPARISON:  Chest CT 05/30/2022.  Chest radiographs 05/22/2022 FINDINGS: Postoperative changes in the mediastinum. Right central venous catheter with tip over the mid SVC region. No pneumothorax. Heart size and pulmonary vascularity are normal. Lungs are clear. No pleural effusions. No pneumothorax. Mediastinal contours appear intact. Degenerative changes in the spine and shoulders. IMPRESSION: No active cardiopulmonary disease. Electronically Signed   By: Burman Nieves M.D.   On: 06/27/2022 22:28     Medical Consultants:   None.   Subjective:    Jennifer Hogan feels great all her symptoms is resolved she would like to eat.  Objective:    Vitals:   06/28/22 0345 06/28/22 0421 06/28/22 0500 06/28/22 0538  BP: (!) 143/73  123/75 136/64  Pulse: (!) 125  (!) 119 (!) 118  Resp: 16  17 18   Temp:  98.9 F (37.2 C)  97.9 F (36.6 C)  TempSrc:    Oral  SpO2: 100%  100% 100%  Weight:      Height:       SpO2: 100 % O2 Flow Rate (L/min): 3 L/min  No intake or output data in the 24 hours ending 06/28/22 0628 Filed Weights   06/27/22 2151  Weight: 58.1 kg    Exam: General exam: In no acute distress. Respiratory system: Good air movement and clear to auscultation. Cardiovascular system: S1 & S2 heard, RRR. No JVD. Gastrointestinal system: Abdomen is nondistended, soft and nontender.  Extremities: No pedal edema. Skin: No rashes, lesions or ulcers Psychiatry: Judgement and insight appear normal. Mood & affect appropriate.    Data Reviewed:    Labs: Basic Metabolic Panel: Recent Labs  Lab 06/22/22 1105 06/27/22 2201  NA 136 131*  K 4.1 4.8  CL 102 96*  CO2 24 20*  GLUCOSE 185* 380*  BUN 16 23*  CREATININE 0.93 1.15*  CALCIUM 9.2 9.7  MG 2.2  --    GFR Estimated Creatinine Clearance: 44.7 mL/min (A)  (by C-G formula based on SCr of 1.15 mg/dL (H)). Liver Function Tests: Recent Labs  Lab 06/22/22 1105 06/27/22 2201  AST 39 29  ALT 35 28  ALKPHOS 141* 129*  BILITOT 0.5 0.7  PROT 7.3 8.1  ALBUMIN 3.6 4.2   Recent Labs  Lab 06/27/22 2201  LIPASE 30   No results for input(s): "AMMONIA" in the last 168 hours. Coagulation profile No results for input(s): "INR", "PROTIME" in the last 168 hours. COVID-19 Labs  No results for input(s): "DDIMER", "FERRITIN", "LDH", "CRP" in the last 72 hours.  Lab Results  Component Value Date   SARSCOV2NAA NEGATIVE 05/30/2021   SARSCOV2NAA NEGATIVE 01/02/2021   SARSCOV2NAA NEGATIVE 09/30/2020   SARSCOV2NAA NEGATIVE 03/03/2020    CBC: Recent Labs  Lab 06/22/22 1105 06/27/22  2201  WBC 5.0 6.3  NEUTROABS  --  3.7  HGB 10.7* 10.8*  HCT 33.8* 33.8*  MCV 94.4 91.6  PLT 481* 538*   Cardiac Enzymes: No results for input(s): "CKTOTAL", "CKMB", "CKMBINDEX", "TROPONINI" in the last 168 hours. BNP (last 3 results) No results for input(s): "PROBNP" in the last 8760 hours. CBG: Recent Labs  Lab 06/28/22 0142 06/28/22 0254 06/28/22 0346 06/28/22 0459 06/28/22 0556  GLUCAP 208* 137* 130* 224* 220*   D-Dimer: No results for input(s): "DDIMER" in the last 72 hours. Hgb A1c: No results for input(s): "HGBA1C" in the last 72 hours. Lipid Profile: No results for input(s): "CHOL", "HDL", "LDLCALC", "TRIG", "CHOLHDL", "LDLDIRECT" in the last 72 hours. Thyroid function studies: No results for input(s): "TSH", "T4TOTAL", "T3FREE", "THYROIDAB" in the last 72 hours.  Invalid input(s): "FREET3" Anemia work up: No results for input(s): "VITAMINB12", "FOLATE", "FERRITIN", "TIBC", "IRON", "RETICCTPCT" in the last 72 hours. Sepsis Labs: Recent Labs  Lab 06/22/22 1105 06/27/22 2201 06/27/22 2353  WBC 5.0 6.3  --   LATICACIDVEN  --  2.4* 2.4*   Microbiology No results found for this or any previous visit (from the past 240  hour(s)).   Medications:    apixaban  5 mg Oral BID   aspirin EC  81 mg Oral Daily   buPROPion  150 mg Oral Daily   digoxin  0.125 mg Oral Daily   DULoxetine  30 mg Oral QHS   levothyroxine  100 mcg Oral QAC breakfast   mometasone-formoterol  2 puff Inhalation BID   montelukast  10 mg Oral QHS   pantoprazole  40 mg Oral BID   Continuous Infusions:  dextrose 5% lactated ringers 50 mL/hr at 06/28/22 0347   DOBUTamine 3 mcg/kg/min (06/28/22 0332)   insulin 2.6 Units/hr (06/28/22 0537)   lactated ringers Stopped (06/28/22 0328)      LOS: 0 days   Marinda Elk  [facility]  06/28/2022, 6:28 AM

## 2022-06-28 NOTE — ED Notes (Signed)
Per

## 2022-06-28 NOTE — ED Notes (Signed)
Per Dr. David Stall, pt to switch dobutamine infusion to her home pump.

## 2022-06-28 NOTE — ED Notes (Signed)
Pt's husband came from home and brought the pt's insulin pump. Instructed the husband and pt to not hook up the insulin pump until the doctor has come to bedside. Instructed the pt and husband that the pt is currently on an insulin drip.

## 2022-06-28 NOTE — ED Notes (Signed)
Per Dr. David Stall, stop insulin drip. Pt to put on her insulin pump.

## 2022-06-28 NOTE — ED Provider Notes (Signed)
[place] EMERGENCY DEPARTMENT AT [facility] Provider Note   CSN: [id number] Arrival date & time: 06/27/22  2137     History  Chief Complaint  Patient presents with   Fever    Jennifer Hogan is a 52 y.o. female.  Presents to the emergency department for evaluation with concerns over possible low-grade temp as well as nausea and chills.  Patient reports that upon arrival to the emergency department she did vomit.  She has vomited 3 times in total.  She is now starting to feel abdominal pain as well.  Patient reports that this feels similar to when she is going into DKA.  She is a type I diabetic with an insulin pump, just discovered that her insulin pump accidentally became dislodged at some point today.       Home Medications Prior to Admission medications   Medication Sig Start Date End Date Taking? Authorizing Provider  acetaminophen (TYLENOL) 325 MG tablet Take 650 mg by mouth daily as needed for mild pain, fever or headache.    [provider]  albuterol (VENTOLIN HFA) 108 (90 Base) MCG/ACT inhaler INHALE 2 PUFFS INTO THE LUNGS EVERY 6 (SIX) HOURS AS NEEDED FOR WHEEZING OR SHORTNESS OF BREATH. 10/25/21   Newlin, Odette Horns, MD  Alirocumab (PRALUENT) 150 MG/ML SOAJ Inject 1 pen  into the skin every 14 (fourteen) days. Patient not taking: Reported on 06/08/2022 09/23/21   Quintella Reichert, MD  apixaban (ELIQUIS) 5 MG TABS tablet Take 1 tablet (5 mg total) by mouth 2 (two) times daily. 06/27/22   Bensimhon, Bevelyn Buckles, MD  APIXABAN Everlene Balls) VTE STARTER PACK (10MG  AND 5MG ) Take as directed on package: start with two-5mg  tablets twice daily for 7 days. On day 8, switch to one-5mg  tablet twice daily. Patient not taking: Reported on 06/08/2022 05/31/22   Dorthula Nettles, DO  aspirin 81 MG EC tablet Take 1 tablet (81 mg total) by mouth daily. Patient taking differently: Take 81 mg by mouth at bedtime. 12/07/17   Amin, Loura Halt, MD  b complex vitamins tablet Take 1 tablet  by mouth 3 (three) times a week.    [provider]  budesonide (PULMICORT) 0.25 MG/2ML nebulizer solution Take 0.25 mg by nebulization daily as needed (shortness of breath/wheezing).    [provider]  buPROPion (WELLBUTRIN XL) 150 MG 24 hr tablet Take 1 tablet (150 mg total) by mouth every morning. 03/06/22 06/17/22  Karsten Ro, MD  Continuous Glucose Sensor (DEXCOM G6 SENSOR) MISC Use as instructed to check glucose levels. Change every 10 days. 05/29/22   Carlus Pavlov, MD  Continuous Glucose Transmitter (DEXCOM G6 TRANSMITTER) MISC Use as instructed to check glucose levels. Change every 90 days. 05/29/22   Carlus Pavlov, MD  diclofenac Sodium (VOLTAREN) 1 % GEL Apply 2-4 g topically 3 (three) times daily as needed (knee pain).    [provider]  digoxin (LANOXIN) 0.125 MG tablet Take 1 tablet (0.125 mg total) by mouth daily. 05/31/22   Clegg, Amy D, NP  diphenhydrAMINE (BENADRYL) 25 MG tablet Take 37.5 mg by mouth at bedtime.    [provider]  DOBUTamine (DOBUTREX) 4-5 MG/ML-% infusion Inject 191.4 mcg/min into the vein continuous. Per [facility] home infusion 05/31/22   Clegg, Amy D, NP  DULoxetine (CYMBALTA) 30 MG capsule Take 1 capsule (30 mg total) by mouth at bedtime. 06/08/22   Bensimhon, Bevelyn Buckles, MD  fluticasone (FLONASE) 50 MCG/ACT nasal spray Insert 1 spray each nostril daily for 3  days then use 2-3 times a week as needed Patient taking differently: Place 1 spray into both nostrils daily. Insert 1 spray each nostril daily for 3 days then use 2-3 times a week as needed 05/16/22   [name], NP  furosemide (LASIX) 20 MG tablet Take 1 tablet (20 mg total) by mouth daily as needed (for weight gain). 10/14/20   Clegg, Amy D, NP  Glucagon 3 MG/DOSE POWD Place 3 mg into the nose once as needed for up to 1 dose. 07/01/21   Carlus Pavlov, MD  Insulin Disposable Pump (OMNIPOD 5 G6 INTRO, GEN 5,) KIT Use as directed. 05/03/22   Carlus Pavlov, MD  Insulin  Disposable Pump (OMNIPOD 5 G6 PODS, GEN 5,) MISC Use every 3 (three) days. 05/03/22   Carlus Pavlov, MD  insulin lispro (HUMALOG) 100 UNIT/ML injection Inject 0.35 mLs (35 Units total) into the skin daily via pump. 03/08/22   Carlus Pavlov, MD  levothyroxine (SYNTHROID) 100 MCG tablet Take 1 tablet (100 mcg total) by mouth daily before breakfast. 06/09/22   Hoy Register, MD  losartan (COZAAR) 25 MG tablet Take 1/2 tablet (12.5 mg total) by mouth daily. 05/31/22   Clegg, Amy D, NP  metoCLOPramide (REGLAN) 5 MG tablet Take 1 tablet (5 mg total) by mouth every 8 (eight) hours as needed for nausea or vomiting. 12/23/21 12/23/22  Armbruster, Willaim Rayas, MD  mometasone-formoterol (DULERA) 100-5 MCG/ACT AERO Inhale 2 puffs into the lungs 2 (two) times daily. 02/15/22   [name], NP  montelukast (SINGULAIR) 10 MG tablet Take 1 tablet (10 mg total) by mouth at bedtime. 02/15/22   [name], NP  Multiple Vitamin (MULTIVITAMIN WITH MINERALS) TABS tablet Take 1 tablet by mouth daily.    [provider]  nitroGLYCERIN (NITROSTAT) 0.4 MG SL tablet Place 1 tablet (0.4 mg total) under the tongue every 5 (five) minutes as needed for chest pain. 06/08/22   Bensimhon, Bevelyn Buckles, MD  ondansetron (ZOFRAN) 4 MG tablet Take 1 tablet (4 mg total) by mouth daily as needed for nausea or vomiting. 03/24/22 03/24/23  Hollice Espy, MD  pantoprazole (PROTONIX) 40 MG tablet Take 1 tablet (40 mg total) by mouth 2 (two) times daily. 03/24/22   Hollice Espy, MD      Allergies    Atorvastatin, Crestor [rosuvastatin calcium], Monosodium glutamate, Zetia [ezetimibe], Ciprofloxin hcl [ciprofloxacin], and Food color red [red dye]    Review of Systems   Review of Systems  Physical Exam Updated Vital Signs BP (!) 126/108   Pulse (!) 122   Temp 98.4 F (36.9 C)   Resp 16   Ht 5\' 2"  (1.575 m)   Wt 58.1 kg   LMP 06/06/2022   SpO2 100%   BMI 23.41 kg/m  Physical Exam Vitals and nursing note  reviewed.  Constitutional:      General: She is not in acute distress.    Appearance: She is well-developed.  HENT:     Head: Normocephalic and atraumatic.     Mouth/Throat:     Mouth: Mucous membranes are moist.  Eyes:     General: Vision grossly intact. Gaze aligned appropriately.     Extraocular Movements: Extraocular movements intact.     Conjunctiva/sclera: Conjunctivae normal.  Cardiovascular:     Rate and Rhythm: Regular rhythm. Tachycardia present.     Pulses: Normal pulses.     Heart sounds: Normal heart sounds, S1 normal and S2 normal. No murmur heard.  No friction rub. No gallop.  Pulmonary:     Effort: Pulmonary effort is normal. No respiratory distress.     Breath sounds: Normal breath sounds.  Abdominal:     General: Bowel sounds are normal.     Palpations: Abdomen is soft.     Tenderness: There is no abdominal tenderness. There is no guarding or rebound.     Hernia: No hernia is present.  Musculoskeletal:        General: No swelling.     Cervical back: Full passive range of motion without pain, normal range of motion and neck supple. No spinous process tenderness or muscular tenderness. Normal range of motion.     Right lower leg: No edema.     Left lower leg: No edema.  Skin:    General: Skin is warm and dry.     Capillary Refill: Capillary refill takes less than 2 seconds.     Findings: No ecchymosis, erythema, rash or wound.  Neurological:     General: No focal deficit present.     Mental Status: She is alert and oriented to person, place, and time.     GCS: GCS eye subscore is 4. GCS verbal subscore is 5. GCS motor subscore is 6.     Cranial Nerves: Cranial nerves 2-12 are intact.     Sensory: Sensation is intact.     Motor: Motor function is intact.     Coordination: Coordination is intact.  Psychiatric:        Attention and Perception: Attention normal.        Mood and Affect: Mood normal.        Speech: Speech normal.        Behavior: Behavior  normal.     ED Results / Procedures / Treatments   Labs (all labs ordered are listed, but only abnormal results are displayed) Labs Reviewed  LACTIC ACID, PLASMA - Abnormal; Notable for the following components:      Result Value   Lactic Acid, Venous 2.4 (*)    All other components within normal limits  LACTIC ACID, PLASMA - Abnormal; Notable for the following components:   Lactic Acid, Venous 2.4 (*)    All other components within normal limits  COMPREHENSIVE METABOLIC PANEL - Abnormal; Notable for the following components:   Sodium 131 (*)    Chloride 96 (*)    CO2 20 (*)    Glucose, Bld 380 (*)    BUN 23 (*)    Creatinine, Ser 1.15 (*)    Alkaline Phosphatase 129 (*)    GFR, Estimated 57 (*)    All other components within normal limits  CBC WITH DIFFERENTIAL/PLATELET - Abnormal; Notable for the following components:   RBC 3.69 (*)    Hemoglobin 10.8 (*)    HCT 33.8 (*)    Platelets 538 (*)    All other components within normal limits  BETA-HYDROXYBUTYRIC ACID - Abnormal; Notable for the following components:   Beta-Hydroxybutyric Acid 2.82 (*)    All other components within normal limits  CBG MONITORING, ED - Abnormal; Notable for the following components:   Glucose-Capillary 406 (*)    All other components within normal limits  CBG MONITORING, ED - Abnormal; Notable for the following components:   Glucose-Capillary 381 (*)    All other components within normal limits  LIPASE, BLOOD  BRAIN NATRIURETIC PEPTIDE  URINALYSIS, ROUTINE W REFLEX MICROSCOPIC  I-STAT BETA HCG BLOOD, ED ([facility], [facility], [facility] ONLY)  TROPONIN I (  HIGH SENSITIVITY)  TROPONIN I (HIGH SENSITIVITY)    EKG None  Radiology DG Chest 2 View  Result Date: 06/27/2022 CLINICAL DATA:  Shortness of breath EXAM: CHEST - 2 VIEW COMPARISON:  Chest CT 05/30/2022.  Chest radiographs 05/22/2022 FINDINGS: Postoperative changes in the mediastinum. Right central venous catheter with tip over the mid SVC region. No  pneumothorax. Heart size and pulmonary vascularity are normal. Lungs are clear. No pleural effusions. No pneumothorax. Mediastinal contours appear intact. Degenerative changes in the spine and shoulders. IMPRESSION: No active cardiopulmonary disease. Electronically Signed   By: Burman Nieves M.D.   On: 06/27/2022 22:28    Procedures Procedures    Medications Ordered in ED Medications  insulin regular, human (MYXREDLIN) 100 units/ 100 mL infusion (9 Units/hr Intravenous Rate/Dose Change 06/28/22 0057)  lactated ringers infusion ( Intravenous New Bag/Given 06/28/22 0024)  dextrose 5 % in lactated ringers infusion (0 mLs Intravenous Hold 06/28/22 0023)  dextrose 50 % solution 0-50 mL (has no administration in time range)  ondansetron (ZOFRAN) injection 4 mg (4 mg Intravenous Given 06/27/22 2348)  metoCLOPramide (REGLAN) injection 10 mg (10 mg Intravenous Given 06/28/22 0018)  HYDROmorphone (DILAUDID) injection 1 mg (1 mg Intravenous Given 06/28/22 0040)    ED Course/ Medical Decision Making/ A&P                             Medical Decision Making Amount and/or Complexity of Data Reviewed External Data Reviewed: labs, radiology, ECG and notes. Labs: ordered. Decision-making details documented in ED Course. Radiology: ordered and independent interpretation performed. Decision-making details documented in ED Course. ECG/medicine tests: ordered and independent interpretation performed. Decision-making details documented in ED Course.  Risk Prescription drug management.   Patient presents to the emergency department for evaluation of possible fever.  She was concerned about a temperature of 99.8 because she was told that she needed to be seen if she developed a fever because she has a central line in her chest.  Patient has a history of ischemic cardiomyopathy and is on a continuous dobutamine infusion.  Patient afebrile here.  Symptoms have progressed now where she is having nausea, vomiting,  abdominal pain and is found to have a sugar of 400.  Patient endorses similar symptoms with DKA.  Patient does have an elevated beta hydroxybutyric acid and slight acidosis on chemistries.  She has likely early diabetic ketoacidosis.  This is secondary to malfunction of her insulin pump.  Patient with approximately 30% ejection fraction.  No fluid bolus given.  She was started on insulin drip.  Sugars are coming down.  She will need continuous insulin drip to clear any early acidosis she is experiencing.  She was given antiemetics and pain medication, although symptoms have resolved.  CRITICAL CARE Performed by: Gilda Crease   Total critical care time: 30 minutes  Critical care time was exclusive of separately billable procedures and treating other patients.  Critical care was necessary to treat or prevent imminent or life-threatening deterioration.  Critical care was time spent personally by me on the following activities: development of treatment plan with patient and/or surrogate as well as nursing, discussions with consultants, evaluation of patient's response to treatment, examination of patient, obtaining history from patient or surrogate, ordering and performing treatments and interventions, ordering and review of laboratory studies, ordering and review of radiographic studies, pulse oximetry and re-evaluation of patient's condition.         Final Clinical  Impression(s) / ED Diagnoses Final diagnoses:  Type 1 diabetes mellitus with ketoacidosis without coma [facility])    Rx / DC Orders ED Discharge Orders     None         Anguel Delapena, Canary Brim, MD 06/28/22 [phone]

## 2022-06-28 NOTE — H&P (Signed)
History and Physical    Jennifer Hogan AVW:[id number] DOB: 23-Feb-1970 DOA: 06/27/2022  PCP: [name], NP   Patient coming from: Home   Chief Complaint: Nausea, vomiting, chills  HPI: Jennifer Hogan is a 52 y.o. female with medical history significant for type 1 diabetes mellitus, hypothyroidism, CAD, asthma, left IJ DVT on Eliquis, and severe ischemic cardiomyopathy now with end-stage HFrEF on dobutamine infusion who presents to the emergency department with nausea, vomiting, and chills.  Patient was beginning to feel generally poor at home with nausea and chills.  She found her temperature to be 99 F.  She went on to vomit 3 times.  While in the ED, she began to feel worse with some mild abdominal discomfort and noticed that her insulin pump had been inadvertently dislodged.  ED Course: Upon arrival to the ED, patient is found to be afebrile and saturating well on room air with mild tachypnea, heart rate in the 120s, and stable blood pressure.  Chest x-ray is negative for acute cardiopulmonary disease.  Labs are most notable for glucose 380, creatinine 1.15, lactic acid 2.4, normal troponin x 2, and normal BNP.  Patient was treated with Reglan, Zofran, Dilaudid, and IV insulin infusion in the ED.  Review of Systems:  All other systems reviewed and apart from HPI, are negative.  Past Medical History:  Diagnosis Date   Allergy    Anemia    Anxiety    Asthma    CAD S/P percutaneous coronary angioplasty 03/28/2017   Cataract    CHF (congestive heart failure) (HCC)    Coronary artery disease    a. s/p PTCA DES x3 in the LAD (ostial DES placed, mid DES placed, distal DES placed) and PTCA/DES x1 to intermediate branch 02/2017. b. ACS 02/08/18 with LCx stent placement.   Depression    Diabetes mellitus    type 1   DKA (diabetic ketoacidoses) 12/31/2017   DKA (diabetic ketoacidosis) (HCC) 01/13/2022   Dyspnea    with exertion and dust, no oxygen   Elevated platelet count     GERD (gastroesophageal reflux disease)    Heart murmur    never caused any problems   High cholesterol    Hypothyroidism    Ischemic cardiomyopathy    a. EF low-normal with basal inferior akinesis, basal septal hypokinesis by echo 01/2018.   Myocardial infarction (HCC) 2019   Nausea and vomiting 12/04/2010   Non-ST elevation (NSTEMI) myocardial infarction [facility]) 02/08/2018   NSTEMI (non-ST elevated myocardial infarction) (HCC)    Substance abuse (HCC)    marijuana    Past Surgical History:  Procedure Laterality Date   25 GAUGE PARS PLANA VITRECTOMY WITH 20 GAUGE MVR PORT Right 11/24/2021   Procedure: 25 GAUGE PARS PLANA VITRECTOMY APPROACH RIGHT EYE, FLUID FLUID EXCHANGE;  Surgeon: Carmela Rima, MD;  Location: [facility] OR;  Service: Ophthalmology;  Laterality: Right;   AIR/FLUID EXCHANGE Right 12/27/2021   Procedure: AIR/FLUID EXCHANGE;  Surgeon: Carmela Rima, MD;  Location: [facility] OR;  Service: Ophthalmology;  Laterality: Right;   APPENDECTOMY     BIOPSY  09/29/2021   Procedure: BIOPSY;  Surgeon: Benancio Deeds, MD;  Location: [facility];  Service: Gastroenterology;;   CATARACT EXTRACTION W/PHACO Right 11/24/2021   Procedure: CATARACT EXTRACTION AND INTRAOCULAR LENS PLACEMENT (IOC) RIGHT EYE;  Surgeon: Carmela Rima, MD;  Location: [facility] OR;  Service: Ophthalmology;  Laterality: Right;   COLONOSCOPY     COLONOSCOPY WITH PROPOFOL N/A 09/29/2021   Procedure: COLONOSCOPY WITH PROPOFOL;  Surgeon: Benancio Deeds, MD;  Location: [facility];  Service: Gastroenterology;  Laterality: N/A;   CORONARY ARTERY BYPASS GRAFT N/A 11/04/2018   Procedure: CORONARY ARTERY BYPASS GRAFTING (CABG), ON PUMP, TIMES THREE, USING LEFT AND RIGHT INTERNAL MAMMARY ARTERIES;  Surgeon: Linden Dolin, MD;  Location: [facility] OR;  Service: Open Heart Surgery;  Laterality: N/A;   CORONARY STENT INTERVENTION N/A 03/20/2017   Procedure: DES x 3 LAD, DES OM; Surgeon: Kathleene Hazel, MD;  Location: [facility]  INVASIVE CV LAB;  Service: Cardiovascular;  Laterality: N/A;   CORONARY/GRAFT ACUTE MI REVASCULARIZATION N/A 02/08/2018   Procedure: Coronary/Graft Acute MI Revascularization;  Surgeon: Lyn Records, MD;  Location: [facility] INVASIVE CV LAB;  Service: Cardiovascular;  Laterality: N/A;   ESOPHAGOGASTRODUODENOSCOPY (EGD) WITH PROPOFOL N/A 09/29/2021   Procedure: ESOPHAGOGASTRODUODENOSCOPY (EGD) WITH PROPOFOL;  Surgeon: Benancio Deeds, MD;  Location: [facility] END[facility]COPY;  Service: Gastroenterology;  Laterality: N/A;   GAS INSERTION Right 04/28/2021   Procedure: INSERTION OF GAS ( C3F8);  Surgeon: Carmela Rima, MD;  Location: [facility] OR;  Service: Ophthalmology;  Laterality: Right;  Right eye   GAS/FLUID EXCHANGE Right 04/28/2021   Procedure: GAS/FLUID EXCHANGE;  Surgeon: Carmela Rima, MD;  Location: [facility] OR;  Service: Ophthalmology;  Laterality: Right;  Right eye   IR FLUORO GUIDE CV LINE RIGHT  05/29/2022   IR US GUIDE VASC ACCESS RIGHT  05/30/2022   LASER PHOTO ABLATION Right 11/24/2021   Procedure: ENDO LASER PAN PHOTOCOAGULATION;  Surgeon: Carmela Rima, MD;  Location: [facility] OR;  Service: Ophthalmology;  Laterality: Right;   LASER PHOTO ABLATION Right 12/27/2021   Procedure: ENDO LASER;  Surgeon: Carmela Rima, MD;  Location: [facility] OR;  Service: Ophthalmology;  Laterality: Right;   LEFT HEART CATH AND CORONARY ANGIOGRAPHY N/A 03/20/2017   Procedure: LEFT HEART CATH AND CORONARY ANGIOGRAPHY;  Surgeon: Kathleene Hazel, MD;  Location: [facility] INVASIVE CV LAB;  Service: Cardiovascular;  Laterality: N/A;   LEFT HEART CATH AND CORONARY ANGIOGRAPHY N/A 02/08/2018   Procedure: LEFT HEART CATH AND CORONARY ANGIOGRAPHY;  Surgeon: Lyn Records, MD;  Location: [facility] INVASIVE CV LAB;  Service: Cardiovascular;  Laterality: N/A;   LEFT HEART CATH AND CORONARY ANGIOGRAPHY N/A 10/29/2018   Procedure: LEFT HEART CATH AND CORONARY ANGIOGRAPHY;  Surgeon: Kathleene Hazel, MD;  Location: [facility] INVASIVE CV LAB;  Service:  Cardiovascular;  Laterality: N/A;   LEFT HEART CATH AND CORS/GRAFTS ANGIOGRAPHY N/A 10/04/2020   Procedure: LEFT HEART CATH AND CORS/GRAFTS ANGIOGRAPHY;  Surgeon: Tonny Bollman, MD;  Location: [facility] INVASIVE CV LAB;  Service: Cardiovascular;  Laterality: N/A;   LEFT HEART CATH AND CORS/GRAFTS ANGIOGRAPHY N/A 05/11/2021   Procedure: LEFT HEART CATH AND CORS/GRAFTS ANGIOGRAPHY;  Surgeon: Swaziland, Peter M, MD;  Location: [facility] INVASIVE CV LAB;  Service: Cardiovascular;  Laterality: N/A;   PARS PLANA VITRECTOMY Right 04/28/2021   Procedure: PARS PLANA VITRECTOMY WITH 25 GAUGE REMOVAL OF TRATIONAL MEMBRANE RIGHT EYE; DRAINAGE OF SUBRETINAL FLUID RIGHT EYE;  Surgeon: Carmela Rima, MD;  Location: [facility] OR;  Service: Ophthalmology;  Laterality: Right;  Right eye   PARS PLANA VITRECTOMY 27 GAUGE Right 12/27/2021   Procedure: PARS PLANA VITRECTOMY 27 GAUGE;  Surgeon: Carmela Rima, MD;  Location: [facility] OR;  Service: Ophthalmology;  Laterality: Right;   PHOTOCOAGULATION WITH LASER Right 04/28/2021   Procedure: PHOTOCOAGULATION WITH LASER;  Surgeon: Carmela Rima, MD;  Location: [facility] OR;  Service: Ophthalmology;  Laterality: Right;  Right eye   POLYPECTOMY  09/29/2021   Procedure: POLYPECTOMY;  Surgeon:  Armbruster, Willaim Rayas, MD;  Location: [facility];  Service: Gastroenterology;;   RIGHT/LEFT HEART CATH AND CORONARY/GRAFT ANGIOGRAPHY N/A 05/23/2022   Procedure: RIGHT/LEFT HEART CATH AND CORONARY/GRAFT ANGIOGRAPHY;  Surgeon: Kathleene Hazel, MD;  Location: [facility] INVASIVE CV LAB;  Service: Cardiovascular;  Laterality: N/A;   TEE WITHOUT CARDIOVERSION N/A 11/04/2018   Procedure: TRANSESOPHAGEAL ECHOCARDIOGRAM (TEE);  Surgeon: Linden Dolin, MD;  Location: [facility] OR;  Service: Open Heart Surgery;  Laterality: N/A;    Social History:   reports that she has quit smoking. Her smoking use included cigarettes. She has never used smokeless tobacco. She reports that she does not currently use alcohol. She reports current  drug use. Drug: Marijuana.  Allergies  Allergen Reactions   Atorvastatin Other (See Comments)    Myalgias    Crestor [Rosuvastatin Calcium] Other (See Comments)    Severe myalgias and joint pain   Monosodium Glutamate Nausea And Vomiting and Other (See Comments)    MSG - migraine   Zetia [Ezetimibe] Other (See Comments)    myalgias   Ciprofloxin Hcl [Ciprofloxacin] Nausea And Vomiting and Other (See Comments)    Severe migraine   Food Color Red [Red Dye] Diarrhea    Family History  Problem Relation Age of Onset   Cancer Mother        T cell lymphoma   Hypertension Mother    Hypercalcemia Mother    OCD Father    Anxiety disorder Father    Coronary artery disease Father    Congestive Heart Failure Father    Asthma Father    Hypercalcemia Father    Hypertension Father    Colon polyps Father    Depression Maternal Grandmother    Diabetes Paternal Grandmother    Stomach cancer Neg Hx    Esophageal cancer Neg Hx    Colon cancer Neg Hx    Rectal cancer Neg Hx      Prior to Admission medications   Medication Sig Start Date End Date Taking? Authorizing Provider  acetaminophen (TYLENOL) 325 MG tablet Take 650 mg by mouth daily as needed for mild pain, fever or headache.    [provider]  albuterol (VENTOLIN HFA) 108 (90 Base) MCG/ACT inhaler INHALE 2 PUFFS INTO THE LUNGS EVERY 6 (SIX) HOURS AS NEEDED FOR WHEEZING OR SHORTNESS OF BREATH. 10/25/21   Newlin, Odette Horns, MD  Alirocumab (PRALUENT) 150 MG/ML SOAJ Inject 1 pen  into the skin every 14 (fourteen) days. Patient not taking: Reported on 06/08/2022 09/23/21   Quintella Reichert, MD  apixaban (ELIQUIS) 5 MG TABS tablet Take 1 tablet (5 mg total) by mouth 2 (two) times daily. 06/27/22   Bensimhon, Bevelyn Buckles, MD  APIXABAN Everlene Balls) VTE STARTER PACK (10MG  AND 5MG ) Take as directed on package: start with two-5mg  tablets twice daily for 7 days. On day 8, switch to one-5mg  tablet twice daily. Patient not taking: Reported on 06/08/2022  05/31/22   Dorthula Nettles, DO  aspirin 81 MG EC tablet Take 1 tablet (81 mg total) by mouth daily. Patient taking differently: Take 81 mg by mouth at bedtime. 12/07/17   Amin, Loura Halt, MD  b complex vitamins tablet Take 1 tablet by mouth 3 (three) times a week.    [provider]  budesonide (PULMICORT) 0.25 MG/2ML nebulizer solution Take 0.25 mg by nebulization daily as needed (shortness of breath/wheezing).    [provider]  buPROPion (WELLBUTRIN XL) 150 MG 24 hr tablet Take 1 tablet (150 mg total) by mouth every  morning. 03/06/22 06/17/22  Karsten Ro, MD  Continuous Glucose Sensor (DEXCOM G6 SENSOR) MISC Use as instructed to check glucose levels. Change every 10 days. 05/29/22   Carlus Pavlov, MD  Continuous Glucose Transmitter (DEXCOM G6 TRANSMITTER) MISC Use as instructed to check glucose levels. Change every 90 days. 05/29/22   Carlus Pavlov, MD  diclofenac Sodium (VOLTAREN) 1 % GEL Apply 2-4 g topically 3 (three) times daily as needed (knee pain).    [provider]  digoxin (LANOXIN) 0.125 MG tablet Take 1 tablet (0.125 mg total) by mouth daily. 05/31/22   Clegg, Amy D, NP  diphenhydrAMINE (BENADRYL) 25 MG tablet Take 37.5 mg by mouth at bedtime.    [provider]  DOBUTamine (DOBUTREX) 4-5 MG/ML-% infusion Inject 191.4 mcg/min into the vein continuous. Per [facility] home infusion 05/31/22   Clegg, Amy D, NP  DULoxetine (CYMBALTA) 30 MG capsule Take 1 capsule (30 mg total) by mouth at bedtime. 06/08/22   Bensimhon, Bevelyn Buckles, MD  fluticasone (FLONASE) 50 MCG/ACT nasal spray Insert 1 spray each nostril daily for 3 days then use 2-3 times a week as needed Patient taking differently: Place 1 spray into both nostrils daily. Insert 1 spray each nostril daily for 3 days then use 2-3 times a week as needed 05/16/22   [name], NP  furosemide (LASIX) 20 MG tablet Take 1 tablet (20 mg total) by mouth daily as needed (for weight gain). 10/14/20   Clegg, Amy  D, NP  Glucagon 3 MG/DOSE POWD Place 3 mg into the nose once as needed for up to 1 dose. 07/01/21   Carlus Pavlov, MD  Insulin Disposable Pump (OMNIPOD 5 G6 INTRO, GEN 5,) KIT Use as directed. 05/03/22   Carlus Pavlov, MD  Insulin Disposable Pump (OMNIPOD 5 G6 PODS, GEN 5,) MISC Use every 3 (three) days. 05/03/22   Carlus Pavlov, MD  insulin lispro (HUMALOG) 100 UNIT/ML injection Inject 0.35 mLs (35 Units total) into the skin daily via pump. 03/08/22   Carlus Pavlov, MD  levothyroxine (SYNTHROID) 100 MCG tablet Take 1 tablet (100 mcg total) by mouth daily before breakfast. 06/09/22   Hoy Register, MD  losartan (COZAAR) 25 MG tablet Take 1/2 tablet (12.5 mg total) by mouth daily. 05/31/22   Clegg, Amy D, NP  metoCLOPramide (REGLAN) 5 MG tablet Take 1 tablet (5 mg total) by mouth every 8 (eight) hours as needed for nausea or vomiting. 12/23/21 12/23/22  Armbruster, Willaim Rayas, MD  mometasone-formoterol (DULERA) 100-5 MCG/ACT AERO Inhale 2 puffs into the lungs 2 (two) times daily. 02/15/22   [name], NP  montelukast (SINGULAIR) 10 MG tablet Take 1 tablet (10 mg total) by mouth at bedtime. 02/15/22   [name], NP  Multiple Vitamin (MULTIVITAMIN WITH MINERALS) TABS tablet Take 1 tablet by mouth daily.    [provider]  nitroGLYCERIN (NITROSTAT) 0.4 MG SL tablet Place 1 tablet (0.4 mg total) under the tongue every 5 (five) minutes as needed for chest pain. 06/08/22   Bensimhon, Bevelyn Buckles, MD  ondansetron (ZOFRAN) 4 MG tablet Take 1 tablet (4 mg total) by mouth daily as needed for nausea or vomiting. 03/24/22 03/24/23  Hollice Espy, MD  pantoprazole (PROTONIX) 40 MG tablet Take 1 tablet (40 mg total) by mouth 2 (two) times daily. 03/24/22   Hollice Espy, MD    Physical Exam: Vitals:   06/27/22 2151 06/27/22 2335 06/28/22 0031 06/28/22 0043  BP:  (!) 148/90  (!) 126/108  Pulse:  (!) 120  (!) 122  Resp:  (!) 24  16  Temp:   98.4 F (36.9 C)   TempSrc:      SpO2:   100%  100%  Weight: 58.1 kg     Height: 5\' 2"  (1.575 m)       Constitutional: NAD, no pallor or diaphoresis   Eyes: PERTLA, lids and conjunctivae normal ENMT: Mucous membranes are moist. Posterior pharynx clear of any exudate or lesions.   Neck: supple, no masses  Respiratory:  no wheezing, no crackles. No accessory muscle use.  Cardiovascular: S1 & S2 heard, regular rate and rhythm. No extremity edema.   Abdomen: No distension, no tenderness, soft. Bowel sounds active.  Musculoskeletal: no clubbing / cyanosis. No joint deformity upper and lower extremities.   Skin: no significant rashes, lesions, ulcers. Warm, dry, well-perfused. Neurologic: CN 2-12 grossly intact. Moving all extremities. Alert and oriented.  Psychiatric: Pleasant. Cooperative.    Labs and Imaging on Admission: I have personally reviewed following labs and imaging studies  CBC: Recent Labs  Lab 06/22/22 1105 06/27/22 2201  WBC 5.0 6.3  NEUTROABS  --  3.7  HGB 10.7* 10.8*  HCT 33.8* 33.8*  MCV 94.4 91.6  PLT 481* 538*   Basic Metabolic Panel: Recent Labs  Lab 06/22/22 1105 06/27/22 2201  NA 136 131*  K 4.1 4.8  CL 102 96*  CO2 24 20*  GLUCOSE 185* 380*  BUN 16 23*  CREATININE 0.93 1.15*  CALCIUM 9.2 9.7  MG 2.2  --    GFR: Estimated Creatinine Clearance: 44.7 mL/min (A) (by C-G formula based on SCr of 1.15 mg/dL (H)). Liver Function Tests: Recent Labs  Lab 06/22/22 1105 06/27/22 2201  AST 39 29  ALT 35 28  ALKPHOS 141* 129*  BILITOT 0.5 0.7  PROT 7.3 8.1  ALBUMIN 3.6 4.2   Recent Labs  Lab 06/27/22 2201  LIPASE 30   No results for input(s): "AMMONIA" in the last 168 hours. Coagulation Profile: No results for input(s): "INR", "PROTIME" in the last 168 hours. Cardiac Enzymes: No results for input(s): "CKTOTAL", "CKMB", "CKMBINDEX", "TROPONINI" in the last 168 hours. BNP (last 3 results) No results for input(s): "PROBNP" in the last 8760 hours. HbA1C: No results for input(s):  "HGBA1C" in the last 72 hours. CBG: Recent Labs  Lab 06/27/22 2338 06/28/22 0055 06/28/22 0142  GLUCAP 406* 381* 208*   Lipid Profile: No results for input(s): "CHOL", "HDL", "LDLCALC", "TRIG", "CHOLHDL", "LDLDIRECT" in the last 72 hours. Thyroid Function Tests: No results for input(s): "TSH", "T4TOTAL", "FREET4", "T3FREE", "THYROIDAB" in the last 72 hours. Anemia Panel: No results for input(s): "VITAMINB12", "FOLATE", "FERRITIN", "TIBC", "IRON", "RETICCTPCT" in the last 72 hours. Urine analysis:    Component Value Date/Time   COLORURINE YELLOW 05/20/2022 1923   APPEARANCEUR CLEAR 05/20/2022 1923   LABSPEC 1.023 05/20/2022 1923   PHURINE 5.5 05/20/2022 1923   GLUCOSEU >1,000 (A) 05/20/2022 1923   GLUCOSEU NEGATIVE 11/23/2015 0831   HGBUR NEGATIVE 05/20/2022 1923   BILIRUBINUR NEGATIVE 05/20/2022 1923   BILIRUBINUR neg 12/07/2015 0843   KETONESUR >80 (A) 05/20/2022 1923   PROTEINUR TRACE (A) 05/20/2022 1923   UROBILINOGEN 1.0 12/07/2015 0843   UROBILINOGEN 0.2 11/23/2015 0831   NITRITE NEGATIVE 05/20/2022 1923   LEUKOCYTESUR TRACE (A) 05/20/2022 1923   Sepsis Labs: @LABRCNTIP (procalcitonin:4,lacticidven:4) )No results found for this or any previous visit (from the past 240 hour(s)).   Radiological Exams on Admission: DG Chest 2 View  Result Date:  06/27/2022 CLINICAL DATA:  Shortness of breath EXAM: CHEST - 2 VIEW COMPARISON:  Chest CT 05/30/2022.  Chest radiographs 05/22/2022 FINDINGS: Postoperative changes in the mediastinum. Right central venous catheter with tip over the mid SVC region. No pneumothorax. Heart size and pulmonary vascularity are normal. Lungs are clear. No pleural effusions. No pneumothorax. Mediastinal contours appear intact. Degenerative changes in the spine and shoulders. IMPRESSION: No active cardiopulmonary disease. Electronically Signed   By: Burman Nieves M.D.   On: 06/27/2022 22:28    EKG: Independently reviewed. Sinus tachycardia, rate 115.    Assessment/Plan   1. Uncontrolled type I DM with hyperglycemia  - A1c was 7.8% in May 2024  - Serum glucose 380 in ED with slightly low bicarbonate and BHOB 2.82 in setting of inadvertently disconnecting insulin pump  - She was started on IVF and IV insulin infusion in ED  - Continue IV insulin infusion with frequent CBGs, repeat chem panel in am, consult diabetes coordinator   2. Ischemic CM; end-stage HFpEF  - Appears compensated  - Continue digoxin and dobutamine, monitor weight and I/Os    3. CAD  - No anginal complaints  - Continue ASA    4. Depression, anxiety  - Continue Cymbalta and Wellbutrin    5. Hypothyroidism  - Continue Synthroid   6. Asthma  - Not in exacerbation on admission   - Continue ICS-LABA and as-needed albuterol    DVT prophylaxis: Eliquis  Code Status: Full  Level of Care: Level of care: Progressive Family Communication: None present  Disposition Plan:  Patient is from: home  Anticipated d/c is to: Home Anticipated d/c date is: 06/29/22  Patient currently: Pending glycemic-control and transition back to sq insulin  Consults called: Non e Admission status: Observation     Briscoe Deutscher, MD [facility]  06/28/2022, 2:41 AM

## 2022-06-29 ENCOUNTER — Ambulatory Visit: Payer: Medicare Other | Admitting: Gastroenterology

## 2022-06-29 ENCOUNTER — Encounter (HOSPITAL_COMMUNITY): Payer: Self-pay | Admitting: Internal Medicine

## 2022-06-29 ENCOUNTER — Ambulatory Visit (HOSPITAL_COMMUNITY)
Admission: RE | Admit: 2022-06-29 | Discharge: 2022-06-29 | Disposition: A | Discharge status: Home / Self Care | Payer: Medicare Other | Source: Ambulatory Visit | Attending: Cardiology | Admitting: Cardiology

## 2022-06-29 VITALS — BP 149/88 | HR 100 | Temp 98.5°F | Resp 17

## 2022-06-29 DIAGNOSIS — I5021 Acute systolic (congestive) heart failure: Secondary | ICD-10-CM | POA: Insufficient documentation

## 2022-06-29 DIAGNOSIS — I255 Ischemic cardiomyopathy: Secondary | ICD-10-CM | POA: Diagnosis present

## 2022-06-29 LAB — MAGNESIUM: Magnesium: 1.9 mg/dL (ref 1.7–2.4)

## 2022-06-29 MED ORDER — HEPARIN SOD (PORK) LOCK FLUSH 100 UNIT/ML IV SOLN
INTRAVENOUS | Status: AC
  Filled 2022-06-29: qty 5

## 2022-06-29 MED ORDER — HEPARIN SOD (PORK) LOCK FLUSH 100 UNIT/ML IV SOLN: 250.0000 [IU] | INTRAVENOUS | Status: DC

## 2022-07-06 ENCOUNTER — Ambulatory Visit (HOSPITAL_COMMUNITY)
Admission: RE | Admit: 2022-07-06 | Discharge: 2022-07-06 | Disposition: A | Discharge status: Home / Self Care | Payer: Medicare Other | Source: Ambulatory Visit | Attending: Cardiology | Admitting: Cardiology

## 2022-07-06 DIAGNOSIS — I5021 Acute systolic (congestive) heart failure: Secondary | ICD-10-CM

## 2022-07-06 DIAGNOSIS — I255 Ischemic cardiomyopathy: Secondary | ICD-10-CM

## 2022-07-06 LAB — COMPREHENSIVE METABOLIC PANEL
ALT: 26 U/L (ref 0–44)
AST: 27 U/L (ref 15–41)
Albumin: 3.7 g/dL (ref 3.5–5.0)
Alkaline Phosphatase: 96 U/L (ref 38–126)
Anion gap: 9 (ref 5–15)
BUN: 18 mg/dL (ref 6–20)
CO2: 26 mmol/L (ref 22–32)
Calcium: 8.9 mg/dL (ref 8.9–10.3)
Chloride: 101 mmol/L (ref 98–111)
Creatinine, Ser: 0.89 mg/dL (ref 0.44–1.00)
GFR, Estimated: >60 mL/min (ref >60)
Glucose, Bld: 117 mg/dL — ABNORMAL HIGH (ref 70–99)
Potassium: 4.1 mmol/L (ref 3.5–5.1)
Sodium: 136 mmol/L (ref 135–145)
Total Bilirubin: 0.4 mg/dL (ref 0.3–1.2)
Total Protein: 7.2 g/dL (ref 6.5–8.1)

## 2022-07-06 LAB — CBC
HCT: 34.1 % — ABNORMAL LOW (ref 36.0–46.0)
Hemoglobin: 11 g/dL — ABNORMAL LOW (ref 12.0–15.0)
MCH: 29.8 pg (ref 26.0–34.0)
MCHC: 32.3 g/dL (ref 30.0–36.0)
MCV: 92.4 fL (ref 80.0–100.0)
Platelets: 396 10*3/uL (ref 150–400)
RBC: 3.69 MIL/uL — ABNORMAL LOW (ref 3.87–5.11)
RDW: 14 % (ref 11.5–15.5)
WBC: 3.3 10*3/uL — ABNORMAL LOW (ref 4.0–10.5)
nRBC: 0 % (ref 0.0–0.2)

## 2022-07-06 LAB — MAGNESIUM: Magnesium: 2.2 mg/dL (ref 1.7–2.4)

## 2022-07-10 ENCOUNTER — Other Ambulatory Visit: Payer: Self-pay

## 2022-07-10 ENCOUNTER — Other Ambulatory Visit: Payer: Self-pay | Admitting: Family Medicine

## 2022-07-10 MED ORDER — LEVOTHYROXINE SODIUM 100 MCG PO TABS
100.0000 ug | ORAL_TABLET | Freq: Every day | ORAL | 0 refills | Status: DC
  Filled 2022-07-10: qty 30, 30d supply, fill #0

## 2022-07-13 ENCOUNTER — Other Ambulatory Visit: Payer: Self-pay

## 2022-07-13 ENCOUNTER — Encounter (HOSPITAL_COMMUNITY)
Admission: RE | Admit: 2022-07-13 | Discharge: 2022-07-13 | Disposition: A | Discharge status: Home / Self Care | Payer: Medicare Other | Source: Ambulatory Visit | Attending: Cardiology | Admitting: Cardiology

## 2022-07-13 VITALS — BP 150/73 | HR 71 | Temp 98.1°F | Resp 17

## 2022-07-13 DIAGNOSIS — I5021 Acute systolic (congestive) heart failure: Secondary | ICD-10-CM | POA: Diagnosis present

## 2022-07-13 DIAGNOSIS — I429 Cardiomyopathy, unspecified: Secondary | ICD-10-CM | POA: Diagnosis present

## 2022-07-13 DIAGNOSIS — I255 Ischemic cardiomyopathy: Secondary | ICD-10-CM | POA: Diagnosis present

## 2022-07-13 LAB — COMPREHENSIVE METABOLIC PANEL
ALT: 26 U/L (ref 0–44)
AST: 32 U/L (ref 15–41)
Albumin: 3.3 g/dL — ABNORMAL LOW (ref 3.5–5.0)
Alkaline Phosphatase: 88 U/L (ref 38–126)
Anion gap: 8 (ref 5–15)
BUN: 10 mg/dL (ref 6–20)
CO2: 25 mmol/L (ref 22–32)
Calcium: 8.9 mg/dL (ref 8.9–10.3)
Chloride: 101 mmol/L (ref 98–111)
Creatinine, Ser: 0.89 mg/dL (ref 0.44–1.00)
GFR, Estimated: >60 mL/min (ref >60)
Glucose, Bld: 198 mg/dL — ABNORMAL HIGH (ref 70–99)
Potassium: 4 mmol/L (ref 3.5–5.1)
Sodium: 134 mmol/L — ABNORMAL LOW (ref 135–145)
Total Bilirubin: 0.2 mg/dL — ABNORMAL LOW (ref 0.3–1.2)
Total Protein: 6.6 g/dL (ref 6.5–8.1)

## 2022-07-13 LAB — CBC
HCT: 32 % — ABNORMAL LOW (ref 36.0–46.0)
Hemoglobin: 10.1 g/dL — ABNORMAL LOW (ref 12.0–15.0)
MCH: 28.8 pg (ref 26.0–34.0)
MCHC: 31.6 g/dL (ref 30.0–36.0)
MCV: 91.2 fL (ref 80.0–100.0)
Platelets: 342 10*3/uL (ref 150–400)
RBC: 3.51 MIL/uL — ABNORMAL LOW (ref 3.87–5.11)
RDW: 13.9 % (ref 11.5–15.5)
WBC: 3.7 10*3/uL — ABNORMAL LOW (ref 4.0–10.5)
nRBC: 0 % (ref 0.0–0.2)

## 2022-07-13 LAB — MAGNESIUM: Magnesium: 2.1 mg/dL (ref 1.7–2.4)

## 2022-07-19 NOTE — Progress Notes (Signed)
VAD Coordinator attempted to call patient at the request of [facility] as they have not been able to reach her to set up an appointment for transplant evaluation. VM left providing their main office phone number and our office phone number if any assistance is needed.  Simmie Davies RN, BSN VAD Coordinator 24/7 Pager [phone]

## 2022-07-20 ENCOUNTER — Ambulatory Visit (HOSPITAL_COMMUNITY)
Admission: RE | Admit: 2022-07-20 | Discharge: 2022-07-20 | Disposition: A | Discharge status: Home / Self Care | Payer: Medicare Other | Source: Ambulatory Visit | Attending: Cardiology | Admitting: Cardiology

## 2022-07-20 DIAGNOSIS — I255 Ischemic cardiomyopathy: Secondary | ICD-10-CM

## 2022-07-20 DIAGNOSIS — I429 Cardiomyopathy, unspecified: Secondary | ICD-10-CM

## 2022-07-20 LAB — COMPREHENSIVE METABOLIC PANEL
ALT: 36 U/L (ref 0–44)
AST: 39 U/L (ref 15–41)
Albumin: 3.6 g/dL (ref 3.5–5.0)
Alkaline Phosphatase: 98 U/L (ref 38–126)
Anion gap: 8 (ref 5–15)
BUN: 13 mg/dL (ref 6–20)
CO2: 24 mmol/L (ref 22–32)
Calcium: 8.7 mg/dL — ABNORMAL LOW (ref 8.9–10.3)
Chloride: 98 mmol/L (ref 98–111)
Creatinine, Ser: 0.93 mg/dL (ref 0.44–1.00)
GFR, Estimated: >60 mL/min (ref >60)
Glucose, Bld: 148 mg/dL — ABNORMAL HIGH (ref 70–99)
Potassium: 3.6 mmol/L (ref 3.5–5.1)
Sodium: 130 mmol/L — ABNORMAL LOW (ref 135–145)
Total Bilirubin: 0.4 mg/dL (ref 0.3–1.2)
Total Protein: 6.9 g/dL (ref 6.5–8.1)

## 2022-07-20 LAB — CBC
HCT: 32.7 % — ABNORMAL LOW (ref 36.0–46.0)
Hemoglobin: 10.6 g/dL — ABNORMAL LOW (ref 12.0–15.0)
MCH: 29.7 pg (ref 26.0–34.0)
MCHC: 32.4 g/dL (ref 30.0–36.0)
MCV: 91.6 fL (ref 80.0–100.0)
Platelets: 377 10*3/uL (ref 150–400)
RBC: 3.57 MIL/uL — ABNORMAL LOW (ref 3.87–5.11)
RDW: 13.9 % (ref 11.5–15.5)
WBC: 4 10*3/uL (ref 4.0–10.5)
nRBC: 0 % (ref 0.0–0.2)

## 2022-07-20 LAB — MAGNESIUM: Magnesium: 2.2 mg/dL (ref 1.7–2.4)

## 2022-07-28 ENCOUNTER — Other Ambulatory Visit: Payer: Self-pay | Admitting: Nurse Practitioner

## 2022-07-28 ENCOUNTER — Other Ambulatory Visit: Payer: Self-pay | Admitting: Family Medicine

## 2022-07-28 ENCOUNTER — Other Ambulatory Visit: Payer: Self-pay

## 2022-07-28 ENCOUNTER — Ambulatory Visit (HOSPITAL_COMMUNITY)
Admission: RE | Admit: 2022-07-28 | Discharge: 2022-07-28 | Disposition: A | Discharge status: Home / Self Care | Payer: Medicare Other | Source: Ambulatory Visit | Attending: Cardiology | Admitting: Cardiology

## 2022-07-28 DIAGNOSIS — I429 Cardiomyopathy, unspecified: Secondary | ICD-10-CM | POA: Insufficient documentation

## 2022-07-28 DIAGNOSIS — I255 Ischemic cardiomyopathy: Secondary | ICD-10-CM | POA: Insufficient documentation

## 2022-07-28 LAB — CBC
HCT: 32.9 % — ABNORMAL LOW (ref 36.0–46.0)
Hemoglobin: 10.5 g/dL — ABNORMAL LOW (ref 12.0–15.0)
MCH: 28.8 pg (ref 26.0–34.0)
MCHC: 31.9 g/dL (ref 30.0–36.0)
MCV: 90.1 fL (ref 80.0–100.0)
Platelets: 429 10*3/uL — ABNORMAL HIGH (ref 150–400)
RBC: 3.65 MIL/uL — ABNORMAL LOW (ref 3.87–5.11)
RDW: 13.4 % (ref 11.5–15.5)
WBC: 4.4 10*3/uL (ref 4.0–10.5)
nRBC: 0 % (ref 0.0–0.2)

## 2022-07-28 LAB — COMPREHENSIVE METABOLIC PANEL
ALT: 30 U/L (ref 0–44)
AST: 31 U/L (ref 15–41)
Albumin: 3.5 g/dL (ref 3.5–5.0)
Alkaline Phosphatase: 108 U/L (ref 38–126)
Anion gap: 9 (ref 5–15)
BUN: 17 mg/dL (ref 6–20)
CO2: 25 mmol/L (ref 22–32)
Calcium: 8.8 mg/dL — ABNORMAL LOW (ref 8.9–10.3)
Chloride: 100 mmol/L (ref 98–111)
Creatinine, Ser: 0.95 mg/dL (ref 0.44–1.00)
GFR, Estimated: >60 mL/min (ref >60)
Glucose, Bld: 166 mg/dL — ABNORMAL HIGH (ref 70–99)
Potassium: 4.2 mmol/L (ref 3.5–5.1)
Sodium: 134 mmol/L — ABNORMAL LOW (ref 135–145)
Total Bilirubin: 0.3 mg/dL (ref 0.3–1.2)
Total Protein: 6.9 g/dL (ref 6.5–8.1)

## 2022-07-28 LAB — MAGNESIUM: Magnesium: 2.2 mg/dL (ref 1.7–2.4)

## 2022-07-28 MED ORDER — LEVOTHYROXINE SODIUM 100 MCG PO TABS
100.0000 ug | ORAL_TABLET | Freq: Every day | ORAL | 0 refills | Status: DC
  Filled 2022-07-28 – 2022-08-11 (×2): qty 30, 30d supply, fill #0

## 2022-07-31 ENCOUNTER — Other Ambulatory Visit: Payer: Self-pay

## 2022-08-01 ENCOUNTER — Other Ambulatory Visit (HOSPITAL_COMMUNITY): Payer: Self-pay

## 2022-08-01 ENCOUNTER — Other Ambulatory Visit: Payer: Self-pay

## 2022-08-01 DIAGNOSIS — I5022 Chronic systolic (congestive) heart failure: Secondary | ICD-10-CM

## 2022-08-04 ENCOUNTER — Other Ambulatory Visit (HOSPITAL_COMMUNITY): Payer: Self-pay | Admitting: *Deleted

## 2022-08-04 ENCOUNTER — Ambulatory Visit (HOSPITAL_COMMUNITY)
Admission: RE | Admit: 2022-08-04 | Discharge: 2022-08-04 | Disposition: A | Discharge status: Home / Self Care | Payer: Medicare Other | Source: Ambulatory Visit | Attending: Internal Medicine | Admitting: Internal Medicine

## 2022-08-04 ENCOUNTER — Ambulatory Visit (HOSPITAL_COMMUNITY)
Admission: RE | Admit: 2022-08-04 | Discharge: 2022-08-04 | Disposition: A | Discharge status: Home / Self Care | Payer: Medicare Other | Source: Ambulatory Visit | Attending: Cardiology | Admitting: Cardiology

## 2022-08-04 DIAGNOSIS — I5023 Acute on chronic systolic (congestive) heart failure: Secondary | ICD-10-CM | POA: Diagnosis present

## 2022-08-04 DIAGNOSIS — E039 Hypothyroidism, unspecified: Secondary | ICD-10-CM | POA: Diagnosis not present

## 2022-08-04 DIAGNOSIS — Z8249 Family history of ischemic heart disease and other diseases of the circulatory system: Secondary | ICD-10-CM | POA: Insufficient documentation

## 2022-08-04 DIAGNOSIS — Z5986 Financial insecurity: Secondary | ICD-10-CM | POA: Diagnosis not present

## 2022-08-04 DIAGNOSIS — I5022 Chronic systolic (congestive) heart failure: Secondary | ICD-10-CM | POA: Diagnosis not present

## 2022-08-04 DIAGNOSIS — Z955 Presence of coronary angioplasty implant and graft: Secondary | ICD-10-CM | POA: Insufficient documentation

## 2022-08-04 DIAGNOSIS — Z79899 Other long term (current) drug therapy: Secondary | ICD-10-CM | POA: Diagnosis not present

## 2022-08-04 DIAGNOSIS — E108 Type 1 diabetes mellitus with unspecified complications: Secondary | ICD-10-CM

## 2022-08-04 DIAGNOSIS — F419 Anxiety disorder, unspecified: Secondary | ICD-10-CM | POA: Diagnosis not present

## 2022-08-04 DIAGNOSIS — Z5982 Transportation insecurity: Secondary | ICD-10-CM | POA: Diagnosis not present

## 2022-08-04 DIAGNOSIS — Z9641 Presence of insulin pump (external) (internal): Secondary | ICD-10-CM | POA: Diagnosis not present

## 2022-08-04 DIAGNOSIS — I255 Ischemic cardiomyopathy: Secondary | ICD-10-CM

## 2022-08-04 DIAGNOSIS — Z951 Presence of aortocoronary bypass graft: Secondary | ICD-10-CM | POA: Insufficient documentation

## 2022-08-04 DIAGNOSIS — N179 Acute kidney failure, unspecified: Secondary | ICD-10-CM | POA: Diagnosis not present

## 2022-08-04 DIAGNOSIS — D649 Anemia, unspecified: Secondary | ICD-10-CM | POA: Insufficient documentation

## 2022-08-04 DIAGNOSIS — Z794 Long term (current) use of insulin: Secondary | ICD-10-CM | POA: Diagnosis not present

## 2022-08-04 DIAGNOSIS — I951 Orthostatic hypotension: Secondary | ICD-10-CM | POA: Insufficient documentation

## 2022-08-04 DIAGNOSIS — Z87891 Personal history of nicotine dependence: Secondary | ICD-10-CM | POA: Insufficient documentation

## 2022-08-04 DIAGNOSIS — I429 Cardiomyopathy, unspecified: Secondary | ICD-10-CM

## 2022-08-04 DIAGNOSIS — I252 Old myocardial infarction: Secondary | ICD-10-CM | POA: Insufficient documentation

## 2022-08-04 DIAGNOSIS — E1010 Type 1 diabetes mellitus with ketoacidosis without coma: Secondary | ICD-10-CM | POA: Insufficient documentation

## 2022-08-04 DIAGNOSIS — I2510 Atherosclerotic heart disease of native coronary artery without angina pectoris: Secondary | ICD-10-CM | POA: Insufficient documentation

## 2022-08-04 DIAGNOSIS — Z833 Family history of diabetes mellitus: Secondary | ICD-10-CM | POA: Diagnosis not present

## 2022-08-04 DIAGNOSIS — Z86718 Personal history of other venous thrombosis and embolism: Secondary | ICD-10-CM | POA: Diagnosis not present

## 2022-08-04 DIAGNOSIS — D509 Iron deficiency anemia, unspecified: Secondary | ICD-10-CM

## 2022-08-04 DIAGNOSIS — I5084 End stage heart failure: Secondary | ICD-10-CM | POA: Diagnosis not present

## 2022-08-04 DIAGNOSIS — Z7901 Long term (current) use of anticoagulants: Secondary | ICD-10-CM | POA: Insufficient documentation

## 2022-08-04 LAB — CBC
HCT: 31.7 % — ABNORMAL LOW (ref 36.0–46.0)
Hemoglobin: 10.2 g/dL — ABNORMAL LOW (ref 12.0–15.0)
MCH: 28.8 pg (ref 26.0–34.0)
MCHC: 32.2 g/dL (ref 30.0–36.0)
MCV: 89.5 fL (ref 80.0–100.0)
Platelets: 358 10*3/uL (ref 150–400)
RBC: 3.54 MIL/uL — ABNORMAL LOW (ref 3.87–5.11)
RDW: 13.4 % (ref 11.5–15.5)
WBC: 4.7 10*3/uL (ref 4.0–10.5)
nRBC: 0 % (ref 0.0–0.2)

## 2022-08-04 LAB — COMPREHENSIVE METABOLIC PANEL
ALT: 18 U/L (ref 0–44)
AST: 23 U/L (ref 15–41)
Albumin: 3.5 g/dL (ref 3.5–5.0)
Alkaline Phosphatase: 84 U/L (ref 38–126)
Anion gap: 8 (ref 5–15)
BUN: 6 mg/dL (ref 6–20)
CO2: 24 mmol/L (ref 22–32)
Calcium: 9 mg/dL (ref 8.9–10.3)
Chloride: 103 mmol/L (ref 98–111)
Creatinine, Ser: 0.82 mg/dL (ref 0.44–1.00)
GFR, Estimated: >60 mL/min (ref >60)
Glucose, Bld: 188 mg/dL — ABNORMAL HIGH (ref 70–99)
Potassium: 3.7 mmol/L (ref 3.5–5.1)
Sodium: 135 mmol/L (ref 135–145)
Total Bilirubin: 0.5 mg/dL (ref 0.3–1.2)
Total Protein: 6.8 g/dL (ref 6.5–8.1)

## 2022-08-04 LAB — FOLATE: Folate: 16.5 ng/mL (ref >5.9)

## 2022-08-04 LAB — FERRITIN: Ferritin: 7 ng/mL — ABNORMAL LOW (ref 11–307)

## 2022-08-04 LAB — IRON AND TIBC
Iron: 41 ug/dL (ref 28–170)
Saturation Ratios: 9 % — ABNORMAL LOW (ref 10.4–31.8)
TIBC: 459 ug/dL — ABNORMAL HIGH (ref 250–450)
UIBC: 418 ug/dL

## 2022-08-04 LAB — VITAMIN B12: Vitamin B-12: 166 pg/mL — ABNORMAL LOW (ref 180–914)

## 2022-08-04 LAB — MAGNESIUM: Magnesium: 2.1 mg/dL (ref 1.7–2.4)

## 2022-08-04 LAB — DIGOXIN LEVEL: Digoxin Level: 0.5 ng/mL — ABNORMAL LOW (ref 0.8–2.0)

## 2022-08-04 NOTE — Addendum Note (Signed)
Addended by: Alyce Pagan B on: 08/04/2022 02:04 PM   Modules accepted: Orders

## 2022-08-04 NOTE — Progress Notes (Signed)
Advanced Heart Failure Clinic Note    PCP: [name], NP PCP-Cardiologist: Armanda Magic, MD  HF Cardiologist: Dr. Gala Romney  HPI: Ms Tolento is a 52 y.o.with HFrEF, CAD, status post coronary artery bypass and graft (LIMA to the LAD/ramus intermedius and RIMA to the obtuse marginal), DM Type I,  anemia, anxiety, hypothyroidism, and hyperlipidemia.    Admitted 09/2020 with DKA and NSTEMI.  Hospital course complicated by acute respiratory failure and pulmonary edema. Had ECHO with reduced EF down to 20-25%. LHC showed severe CAD with total occlusion of the proximal LAD, moderate stenosis of the intermediate branch, and severe ostial circumflex in-stent restenosis. Diuresed with IV lasix. Discharged on Toprol Xl and spironolactone. Discharged to home on 10/06/2020.    Admitted 4/23 with NSTEMI and lactic acidosis. Echo EF 30-35% Cath with stable anatomy.  No culprit.  Severe 2 vessel obstructive CAD (LAD CTO, Ost LCx 99%, RCA distal 60%) Patent sequential LIMA graft  to the ramus intermediate and distal LAD Patent RIMA to the distal LCx Moderately elevated LVEDP  Admitted 5/23 with DKA.   Admitted 04/27-05/08/24 with DKA (in setting of malfunctioning insulin pump) and NSTEMI. HS troponin > 24,000. Hospital course c/b cardiogenic shock. L/RHC  w/ stable coronary anatomy and 2/2 patent bypass grafts, elevated PCWP, cardiac index 1.8, and PA sat 46%. Started on inotropes. Once diuresed attempted inotrope wean but quickly decompensated. Stabilized back on Dobutamine 3 mcg. Started workup for transplant with possible LVAD as backup. Tested + for Tetrahydrocannabinol and Hgb A1c 7.8. CT chest for VAD workup showed LIJ DVT. Placed on eliquis DVT starter kit. She was discharged home on dobutamine.  Has seen Dr. Allena Katz at [facility] in 5/24 concern over DKA/gastroparesis and immunosuppression issues. She has apparently not been answering calls from transplant coordinator recently.   She is here today  for HF follow-up. Remains on DBA at 3. At last visit spiro stopped. Feels good on DBA. Able to feed her horses and do all ADLS without problem. No problem with PICC line. No edema, CP, orthopnea or PND.   She has decided not to proceed with transplant.     Cardiac Testing  Limited echo 04/24: EF 20-25%, akinesis anteroseptal wall and apex, RV severely reduced Echo 4/23: Echo EF 30-35% Echo (12/27/20)  EF 40-45% RV mildly HK Personally reviewed Echo 09/2020 EF 20-25%  Echo 11/2018 EF 55-60%    R/LHC 04/24: Severe double vessel CAD Chronic occlusion of ostial LAD stent. The proximal, mid and distal LAD and Diagonal branches fill from the LIMA graft. The ostial Circumflex stent has severe restenosis, unchanged from last cath. Patent RIMA graft that fills the Circumflex Large dominant RCA with mild non-obstructive disease.    Cath 09/2020   Mid LAD lesion is 60% stenosed.   Ost Cx to Prox Cx lesion is 99% stenosed.   Prox Cx to Mid Cx lesion is 70% stenosed.   Ramus-1 lesion is 60% stenosed.   Ramus-2 lesion is 20% stenosed.   Ramus-3 lesion is 70% stenosed.   Prox LAD to Mid LAD lesion is 100% stenosed.   Dist LAD-1 lesion is 70% stenosed.   Dist LAD-2 lesion is 70% stenosed.   Non-stenotic Mid LAD to Dist LAD lesion was previously treated.   RIMA graft was visualized by angiography and is small.   LIMA and is large.   The graft exhibits no disease.   LV end diastolic pressure is normal.  Severe native vessel CAD with total occlusion of the proximal  LAD, moderate stenosis of the intermediate branch, and severe ostial circumflex in-stent restenosis S/P CABG with patency of the both the RIMA-circumflex and LAD sequential to the intermediate   Past Medical History:  Diagnosis Date   Allergy    Anemia    Anxiety    Asthma    CAD S/P percutaneous coronary angioplasty 03/28/2017   Cataract    CHF (congestive heart failure) (HCC)    Coronary artery disease    a. s/p PTCA DES x3 in  the LAD (ostial DES placed, mid DES placed, distal DES placed) and PTCA/DES x1 to intermediate branch 02/2017. b. ACS 02/08/18 with LCx stent placement.   Depression    Diabetes mellitus    type 1   DKA (diabetic ketoacidoses) 12/31/2017   DKA (diabetic ketoacidosis) (HCC) 01/13/2022   Dyspnea    with exertion and dust, no oxygen   Elevated platelet count    GERD (gastroesophageal reflux disease)    Heart murmur    never caused any problems   High cholesterol    Hypothyroidism    Ischemic cardiomyopathy    a. EF low-normal with basal inferior akinesis, basal septal hypokinesis by echo 01/2018.   Myocardial infarction [facility]) 2019   Nausea and vomiting 12/04/2010   Non-ST elevation (NSTEMI) myocardial infarction [facility]) 02/08/2018   NSTEMI (non-ST elevated myocardial infarction) (HCC)    Substance abuse (HCC)    marijuana   Current Outpatient Medications  Medication Sig Dispense Refill   albuterol (VENTOLIN HFA) 108 (90 Base) MCG/ACT inhaler INHALE 2 PUFFS INTO THE LUNGS EVERY 6 (SIX) HOURS AS NEEDED FOR WHEEZING OR SHORTNESS OF BREATH. 18 g 2   Alirocumab (PRALUENT) 150 MG/ML SOAJ Inject 1 pen  into the skin every 14 (fourteen) days. (Patient not taking: Reported on 06/08/2022) 2 mL 11   apixaban (ELIQUIS) 5 MG TABS tablet Take 1 tablet (5 mg total) by mouth 2 (two) times daily. 60 tablet 11   aspirin 81 MG EC tablet Take 1 tablet (81 mg total) by mouth daily. (Patient taking differently: Take 81 mg by mouth at bedtime.) 90 tablet 0   buPROPion (WELLBUTRIN XL) 150 MG 24 hr tablet Take 1 tablet (150 mg total) by mouth every morning. 30 tablet 2   Continuous Glucose Sensor (DEXCOM G6 SENSOR) MISC Use as instructed to check glucose levels. Change every 10 days. 9 each 1   Continuous Glucose Transmitter (DEXCOM G6 TRANSMITTER) MISC Use as instructed to check glucose levels. Change every 90 days. 1 each 1   digoxin (LANOXIN) 0.125 MG tablet Take 1 tablet (0.125 mg total) by mouth daily. 30 tablet  6   diphenhydrAMINE (BENADRYL) 25 MG tablet Take 37.5 mg by mouth at bedtime.     DOBUTamine (DOBUTREX) 4-5 MG/ML-% infusion Inject 191.4 mcg/min into the vein continuous. Per [facility] home infusion 200 mL 52   DULoxetine (CYMBALTA) 30 MG capsule Take 1 capsule (30 mg total) by mouth at bedtime. 30 capsule 2   fluticasone (FLONASE) 50 MCG/ACT nasal spray Insert 1 spray each nostril daily for 3 days then use 2-3 times a week as needed (Patient taking differently: Place 1 spray into both nostrils daily as needed for allergies.) 16 g 1   Glucagon 3 MG/DOSE POWD Place 3 mg into the nose once as needed for up to 1 dose. 1 each 11   Insulin Disposable Pump (OMNIPOD 5 G6 INTRO, GEN 5,) KIT Use as directed. 1 kit 0   Insulin Disposable Pump (OMNIPOD 5 G6 PODS,  GEN 5,) MISC Use every 3 (three) days. 30 each 3   insulin lispro (HUMALOG) 100 UNIT/ML injection Inject 0.35 mLs (35 Units total) into the skin daily via pump. (Patient taking differently: Inject 35 Units into the skin See admin instructions. 35 units daily via insulin pump) 30 mL 1   levothyroxine (SYNTHROID) 100 MCG tablet Take 1 tablet (100 mcg total) by mouth daily before breakfast. 30 tablet 0   losartan (COZAAR) 25 MG tablet Take 1/2 tablet (12.5 mg total) by mouth daily. 30 tablet 6   meloxicam (MOBIC) 15 MG tablet Take 15 mg by mouth daily as needed for pain.     metoCLOPramide (REGLAN) 5 MG tablet Take 1 tablet (5 mg total) by mouth every 8 (eight) hours as needed for nausea or vomiting. 60 tablet 3   mometasone-formoterol (DULERA) 100-5 MCG/ACT AERO Inhale 2 puffs into the lungs 2 (two) times daily. 13 g 6   montelukast (SINGULAIR) 10 MG tablet Take 1 tablet (10 mg total) by mouth at bedtime. 90 tablet 2   Multiple Vitamin (MULTIVITAMIN WITH MINERALS) TABS tablet Take 1 tablet by mouth daily with lunch.     nitroGLYCERIN (NITROSTAT) 0.4 MG SL tablet Place 1 tablet (0.4 mg total) under the tongue every 5 (five) minutes as needed for chest pain.  25 tablet 6   ondansetron (ZOFRAN) 4 MG tablet Take 1 tablet (4 mg total) by mouth daily as needed for nausea or vomiting. 30 tablet 1   pantoprazole (PROTONIX) 40 MG tablet Take 1 tablet (40 mg total) by mouth 2 (two) times daily. 60 tablet 2   spironolactone (ALDACTONE) 25 MG tablet Take 25 mg by mouth daily.     No current facility-administered medications for this encounter.   Allergies  Allergen Reactions   Crestor [Rosuvastatin Calcium] Other (See Comments)    Myalgias  Arthralgias    Lipitor [Atorvastatin] Other (See Comments)    Myalgias    Monosodium Glutamate Nausea And Vomiting and Other (See Comments)    MSG - migraine   Zetia [Ezetimibe] Other (See Comments)    Myalgias    Ciprofloxin Hcl [Ciprofloxacin] Nausea And Vomiting and Other (See Comments)    Severe migraine   Food Color Red [Red Dye] Diarrhea   Social History   Socioeconomic History   Marital status: Divorced    Spouse name: Not on file   Number of children: 0   Years of education: Not on file   Highest education level: Doctorate  Occupational History   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 3 times weekly; reports decreasing use   Sexual activity: Not Currently    Birth control/protection: None  Other Topics Concern   Not on file  Social History Narrative   Not on file   Social Determinants of Health   Financial Resource Strain: High Risk (05/15/2022)   Overall Financial Resource Strain (CARDIA)    Difficulty of Paying Living Expenses: Very hard  Food Insecurity: No Food  Insecurity  (05/26/2022)   Hunger Vital Sign    Worried About Running Out of Food in the Last Year: Never true    Ran Out of Food in the Last Year: Never true  Recent Concern: Food Insecurity - Food Insecurity Present (05/15/2022)   Hunger Vital Sign    Worried About Running Out of Food in the Last Year: Often true    Ran Out of Food in the Last Year: Often true  Transportation Needs: No Transportation Needs (05/26/2022)   PRAPARE - Administrator, Civil Service (Medical): No    Lack of Transportation (Non-Medical): No  Recent Concern: Transportation Needs - Unmet Transportation Needs (05/15/2022)   PRAPARE - Transportation    Lack of Transportation (Medical): Yes    Lack of Transportation (Non-Medical): Yes  Physical Activity: Unknown (05/15/2022)   Exercise Vital Sign    Days of Exercise per Week: 0 days    Minutes of Exercise per Session: Not on file  Stress: Stress Concern Present (05/15/2022)   [employer] of Occupational Health - Occupational Stress Questionnaire    Feeling of Stress : Very much  Social Connections: Socially Isolated (05/15/2022)   Social Connection and Isolation Panel [NHANES]    Frequency of Communication with Friends and Family: More than three times a week    Frequency of Social Gatherings with Friends and Family: Once a week    Attends Religious Services: Never    Database administrator or Organizations: No    Attends Engineer, structural: Not on file    Marital Status: Divorced  Intimate Partner Violence: Not At Risk (05/26/2022)   Humiliation, Afraid, Rape, and Kick questionnaire    Fear of Current or Ex-Partner: No    Emotionally Abused: No    Physically Abused: No    Sexually Abused: No   Family History  Problem Relation Age of Onset   Cancer Mother        T cell lymphoma   Hypertension Mother    Hypercalcemia Mother    OCD Father    Anxiety disorder Father    Coronary artery disease Father    Congestive Heart Failure Father    Asthma  Father    Hypercalcemia Father    Hypertension Father    Colon polyps Father    Depression Maternal Grandmother    Diabetes Paternal Grandmother    Stomach cancer Neg Hx    Esophageal cancer Neg Hx    Colon cancer Neg Hx    Rectal cancer Neg Hx    There were no vitals taken for this visit.  Wt Readings from Last 3 Encounters:  06/27/22 58.1 kg (128 lb)  06/15/22 58.9 kg (129 lb 12.8 oz)  06/08/22 62.9 kg (138 lb 9.6 oz)   PHYSICAL EXAM: General:  Well appearing. No resp difficulty HEENT: normal Neck: supple. no JVD. Carotids 2+ bilat; no bruits. No lymphadenopathy or thryomegaly appreciated. Cor: PMI nondisplaced. Regular rate & rhythm. No rubs, gallops or murmurs. Right upper chest tunneled cath Lungs: clear Abdomen: soft, nontender, nondistended. No hepatosplenomegaly. No bruits or masses. Good bowel sounds. Extremities: no cyanosis, clubbing, rash, edema Neuro: alert & orientedx3, cranial nerves grossly intact. moves all 4 extremities w/o difficulty. Affect pleasant   ASSESSMENT & PLAN:  1. 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  - Echo 09/2020 EF 20-25%.  - LHC with 09/2020 with severe coronary disease. Total occlusion of the  proximal LAD, moderate stenosis of the intermediate branch, and severe ostial circumflex in-stent restenosis.  - Echo 12/27/20  EF 40-45% RV mildly HK - Echo 12/23 EF 45-50%  - Echo 04/24: EF 20-25% w/ AK of anteroseptal wall and apex, RV severely reduced. Also w/ Hs trop elevation peaking at 5,048. LHC w/ stable coronary disease and 2/2 patent bypass grafts. Suspect drop in EF likely stress induced in setting of critical illness/DKA - RHC c/w low output, LV dominant shock (RA 4, PAP 44/18, PCWP 24, LVEDP 24, FICK CO 3.04, CI 1.85, Co-ox 46%).  - Failed dobutamine wean. Stabilized on Dobutamine 3 mcg. Has [facility] home infusion and gets PICC dressing changes/labs at [facility] - Volume  status stable. Use lasix as needed.  - Continue digoxin 0.125 daily.  - No BB with recent shock. - Off spiro d/t orthostatic hypotension  - Continue 12.5 mg losartan dialy.   - not candidate for SGLT2i w/ Type 1DM and DKA  - Now has end-stage HF d/t severe ICM. No options for revascularization. RV reduced on echo but PAPI okay. Starting workup for transplant with option for LVAD as back-up.   - Has seen Dr. Allena Katz at [facility] in 5/24 concern over DKA/gastroparesis and immunosuppression issues. She has decided not to proceed with transplant and wants to consider VAD - Will present at VAD MRB. RV dysfunction will be an issue. Will repeat echo. Recent RHC with PAPI ok.  - UDS recently + Tetrahydrocannabinol. Will need monthly UDS.  - Long discussion with her and caregivers regarding VAD process   2. CAD/recent NSTEMI  - h/o CABG x 2 2020, LIMA-LAD, RIMA-LCx - LHC 04/24 w/ stable disease, 2/2 patent grafts. No culprit lesions  - No s/s angina - continue medical management  - Does not tolerate statins. Has praluent at home but had not used recently.   3. Type I DM - Recent DKA developed after insulin pump malfunctioned  - Hgb A1C  7.8 on 5/1.    4. AKI  - Scr peaked to 1.7 during recent admit, in setting of DKA and low output HF - Baseline Scr < 1 - Labs today  5. Orthostatic hypotension - Resolved after stopping spiro. - Suspect volume depletion - Given Rx for TED hose  Total time spent 45 minutes. Over half that time spent discussing above.    Arvilla Meres, MD 08/04/22

## 2022-08-04 NOTE — Patient Instructions (Signed)
No medication changes We will schedule you for an echo Return to clinic in 2 months for follow up with Dr Gala Romney We will be in touch regarding LVAD decision

## 2022-08-04 NOTE — Progress Notes (Addendum)
Patient presents for 2 month f/u in VAD Clinic today with her caregivers Olegario Messier and Zollie Beckers.   Pt walked into clinic today. States over the last 2 months she has slowly started feeling better on Dobutamine. She is able to perform her ADLs, feed her horse, and take care of her other animals with minimal difficulty. Denies lightheadedness, dizziness, falls, and shortness of breath. She thinks she may have had COVID last weekend as she felt feverish, weak, and her blood sugars were elevated. When she felt unwell reports her heart was "pumping out of her chest." She did not take a home COVID test at that time. States she is feeling much now, all symptoms have resolved, and blood sugars have normalized.   Pt requesting iron level to be checked today. Anemia panel drawn.   Rt chest tunneled PICC in place. PICC maintained by [facility] infusion center. Tolerating Dobutamine 3 mcg/kg/min.   Complete echo ordered per Dr Gala Romney. Scheduled 7/25 at 12:50. (No precert required per JB 7/12.) Left voicemail for pt with echo appt information.   Vital Signs:  HR: 94 SR BP: 124/84 (104) SPO2: 100 %   Weight: 142.2 lb w/o eqt Last weight: 138.6 lb  Symptom YES NO DETAILS  Angina  x Activity:  Claudication  x How Far:  Syncope  x When:  Stroke  x   Orthopnea  x How many pillows:  PND  x How often:  CPAP  x How many hours:  Pedal Edema  x   Abdominal Fullness  x   Nausea / Vomit  x   Diaphoresis  x When:  Shortness of Breath  x Activity:  Palpitations x  When: last weekend when she was feeling ill. None since.  ICD shock     Bleeding S/S  x   Tea-colored Urine  x   Hospitalizations  x   Emergency Room  x   Other MD  x   Activity Performing all ADLs independently   Fluid   Diet Good appetite   Pt was seen by Dr. Allena Katz at [facility] in 5/24- concern over DKA/gastroparesis, and immunosuppression issues. Dr Gala Romney had long discussion with pt regarding transplant vs VAD. Pt states she is no longer interested  in pursing transplant evaluation, and would like to continue VAD workup instead. Will plan to finish workup, and present pt at next [facility] meeting.   Pt identified Olegario Messier and Zollie Beckers as her primary caregivers. Explained need for 24/7 care when pt is discharged home due to sternal precautions, adaptation to living on support, emotional support, consistent and meticulous exit site care and management, medication adherence and high volume of follow up visits with the VAD Clinic after discharge; both pt and caregivers verbalized understanding of above.   Pt states she is in the process of cleaning out her home. She self reports that her home is very messy/cluttered, and she intends to hire professional cleaners to help her with this overwhelming task. May need home visit prior to VAD implant to assess if her home is safe/appropriate for her to discharge home to.   Identified the following lifestyle modifications while living on MCS:   1. No driving for at least three months and then only if doctor gives permission to do so.   2. No tub baths while pump implanted, and shower only when doctor gives permission.   3. No swimming or submersion in water while implanted with pump.   4. No contact sports or engaging in jumping activities.  5. Exit site care including dressing changes, monitoring for infection, and importance of keeping percutaneous lead stabilized at all times.    Reinforced need for 24 hour/7 day week caregivers. She will also need to abide by sternal precautions with no lifting >10lbs, pushing, pulling and will need assistance with adapting to new life style with VAD equipment and care.   All questions have been answered at this time and contact information was provided should they encounter any further questions. They are agreeable at this time to the evaluation process and will continue moving forward with evaluation.   Per Dr Gala Romney will plan to present at next [facility]. Schedule for f/u in VAD  clinic in 6-8 weeks, with plan to contact pt with MRB decision. Pt and caregivers verbalized understanding.   Patient Instructions:  No medication changes Echo scheduled 7/25 at 12:50 Return to clinic in 2 months for follow up with Dr Gala Romney We will be in touch regarding LVAD decision   Alyce Pagan RN VAD Coordinator  Office: [phone]  24/7 Pager: [phone]

## 2022-08-09 ENCOUNTER — Telehealth (HOSPITAL_COMMUNITY): Payer: Self-pay | Admitting: *Deleted

## 2022-08-09 ENCOUNTER — Other Ambulatory Visit (HOSPITAL_COMMUNITY): Payer: Self-pay

## 2022-08-09 ENCOUNTER — Encounter (HOSPITAL_COMMUNITY): Payer: Self-pay | Admitting: *Deleted

## 2022-08-09 NOTE — Telephone Encounter (Signed)
Attempted to call pt x 3 to make aware of echo appt 08/17/22 at 12:50 and need for 2 doses of IV iron. She has not returned any phone calls to VAD coordinators as requested to confirm she knows about appts.   Per anemia panel pt qualifies for 2 doses of IV Feraheme per [name] PharmD and Dr Gala Romney. Order placed. Spoke with Infusion Clinic- they will plan to give infusions when pt comes weekly for PICC management. First dose of Feraheme scheduled 08/10/22. No PA required. Left message for pt making her aware of need for Feraheme. Requested call back to confirm she received message.   Alyce Pagan RN VAD Coordinator  Office: [phone]  24/7 Pager: [phone]

## 2022-08-10 ENCOUNTER — Ambulatory Visit (HOSPITAL_COMMUNITY)
Admission: RE | Admit: 2022-08-10 | Discharge: 2022-08-10 | Disposition: A | Discharge status: Home / Self Care | Payer: Medicare Other | Source: Ambulatory Visit | Attending: Cardiology | Admitting: Cardiology

## 2022-08-10 ENCOUNTER — Telehealth (HOSPITAL_COMMUNITY): Payer: Self-pay | Admitting: *Deleted

## 2022-08-10 ENCOUNTER — Other Ambulatory Visit: Payer: Self-pay

## 2022-08-10 ENCOUNTER — Telehealth (HOSPITAL_COMMUNITY): Payer: Self-pay | Admitting: Psychiatry

## 2022-08-10 DIAGNOSIS — D509 Iron deficiency anemia, unspecified: Secondary | ICD-10-CM | POA: Diagnosis present

## 2022-08-10 DIAGNOSIS — I429 Cardiomyopathy, unspecified: Secondary | ICD-10-CM

## 2022-08-10 DIAGNOSIS — I5022 Chronic systolic (congestive) heart failure: Secondary | ICD-10-CM | POA: Insufficient documentation

## 2022-08-10 DIAGNOSIS — F331 Major depressive disorder, recurrent, moderate: Secondary | ICD-10-CM

## 2022-08-10 DIAGNOSIS — D649 Anemia, unspecified: Secondary | ICD-10-CM | POA: Diagnosis not present

## 2022-08-10 DIAGNOSIS — F411 Generalized anxiety disorder: Secondary | ICD-10-CM

## 2022-08-10 DIAGNOSIS — I255 Ischemic cardiomyopathy: Secondary | ICD-10-CM | POA: Insufficient documentation

## 2022-08-10 LAB — COMPREHENSIVE METABOLIC PANEL
ALT: 17 U/L (ref 0–44)
AST: 20 U/L (ref 15–41)
Albumin: 3.5 g/dL (ref 3.5–5.0)
Alkaline Phosphatase: 85 U/L (ref 38–126)
Anion gap: 7 (ref 5–15)
BUN: 9 mg/dL (ref 6–20)
CO2: 22 mmol/L (ref 22–32)
Calcium: 8.9 mg/dL (ref 8.9–10.3)
Chloride: 105 mmol/L (ref 98–111)
Creatinine, Ser: 0.86 mg/dL (ref 0.44–1.00)
GFR, Estimated: >60 mL/min (ref >60)
Glucose, Bld: 175 mg/dL — ABNORMAL HIGH (ref 70–99)
Potassium: 4 mmol/L (ref 3.5–5.1)
Sodium: 134 mmol/L — ABNORMAL LOW (ref 135–145)
Total Bilirubin: 0.4 mg/dL (ref 0.3–1.2)
Total Protein: 6.7 g/dL (ref 6.5–8.1)

## 2022-08-10 LAB — MAGNESIUM: Magnesium: 2 mg/dL (ref 1.7–2.4)

## 2022-08-10 LAB — CBC
HCT: 33.2 % — ABNORMAL LOW (ref 36.0–46.0)
Hemoglobin: 10.8 g/dL — ABNORMAL LOW (ref 12.0–15.0)
MCH: 29 pg (ref 26.0–34.0)
MCHC: 32.5 g/dL (ref 30.0–36.0)
MCV: 89.2 fL (ref 80.0–100.0)
Platelets: 368 10*3/uL (ref 150–400)
RBC: 3.72 MIL/uL — ABNORMAL LOW (ref 3.87–5.11)
RDW: 13.9 % (ref 11.5–15.5)
WBC: 4.9 10*3/uL (ref 4.0–10.5)
nRBC: 0 % (ref 0.0–0.2)

## 2022-08-10 MED ORDER — BUPROPION HCL ER (XL) 150 MG PO TB24
150.0000 mg | ORAL_TABLET | ORAL | 1 refills | Status: DC
Start: 2022-08-10 — End: 2022-09-13
  Filled 2022-08-10: qty 30, 30d supply, fill #0
  Filled 2022-09-12: qty 30, 30d supply, fill #1

## 2022-08-10 MED ORDER — HEPARIN SOD (PORK) LOCK FLUSH 100 UNIT/ML IV SOLN
INTRAVENOUS | Status: AC
  Administered 2022-08-10: 250 [IU]
  Filled 2022-08-10: qty 5

## 2022-08-10 MED ORDER — DULOXETINE HCL 30 MG PO CPEP
30.0000 mg | ORAL_CAPSULE | Freq: Every day | ORAL | 1 refills | Status: AC
Start: 2022-08-10 — End: 2022-10-12
  Filled 2022-08-10: qty 30, 30d supply, fill #0
  Filled 2022-09-12: qty 30, 30d supply, fill #1

## 2022-08-10 MED ORDER — SODIUM CHLORIDE 0.9 % IV SOLN
510.0000 mg | INTRAVENOUS | Status: DC
  Administered 2022-08-10: 510 mg via INTRAVENOUS
  Filled 2022-08-10: qty 510

## 2022-08-10 NOTE — Telephone Encounter (Signed)
Patient called asking for a refill of Wellbutrin. Chart reviewed, last seen Feb. Sent to front desk to make an appointment with MD.

## 2022-08-10 NOTE — Telephone Encounter (Signed)
Refill sent to bridge until next appointment.  Daine Gip, MD 08/10/22

## 2022-08-11 ENCOUNTER — Other Ambulatory Visit: Payer: Self-pay

## 2022-08-17 ENCOUNTER — Ambulatory Visit (HOSPITAL_BASED_OUTPATIENT_CLINIC_OR_DEPARTMENT_OTHER)
Admission: RE | Admit: 2022-08-17 | Discharge: 2022-08-17 | Disposition: A | Discharge status: Home / Self Care | Payer: Medicare Other | Source: Ambulatory Visit | Attending: Internal Medicine | Admitting: Internal Medicine

## 2022-08-17 ENCOUNTER — Other Ambulatory Visit: Payer: Self-pay

## 2022-08-17 ENCOUNTER — Ambulatory Visit (HOSPITAL_COMMUNITY)
Admission: RE | Admit: 2022-08-17 | Discharge: 2022-08-17 | Disposition: A | Discharge status: Home / Self Care | Payer: Medicare Other | Source: Ambulatory Visit | Attending: Cardiology | Admitting: Cardiology

## 2022-08-17 DIAGNOSIS — I5022 Chronic systolic (congestive) heart failure: Secondary | ICD-10-CM | POA: Insufficient documentation

## 2022-08-17 DIAGNOSIS — I429 Cardiomyopathy, unspecified: Secondary | ICD-10-CM | POA: Insufficient documentation

## 2022-08-17 DIAGNOSIS — D649 Anemia, unspecified: Secondary | ICD-10-CM | POA: Insufficient documentation

## 2022-08-17 DIAGNOSIS — I255 Ischemic cardiomyopathy: Secondary | ICD-10-CM | POA: Diagnosis present

## 2022-08-17 DIAGNOSIS — D509 Iron deficiency anemia, unspecified: Secondary | ICD-10-CM | POA: Insufficient documentation

## 2022-08-17 LAB — ECHOCARDIOGRAM COMPLETE
AR max vel: 2 cm2
AV Area VTI: 1.97 cm2
AV Area mean vel: 1.93 cm2
AV Mean grad: 5 mmHg
AV Peak grad: 8.9 mmHg
Ao pk vel: 1.49 m/s
Area-P 1/2: 3.65 cm2
Calc EF: 58.1 %
MV VTI: 2.1 cm2
S' Lateral: 3.6 cm
Single Plane A2C EF: 58.1 %
Single Plane A4C EF: 59 %

## 2022-08-17 LAB — COMPREHENSIVE METABOLIC PANEL
ALT: 25 U/L (ref 0–44)
AST: 30 U/L (ref 15–41)
Albumin: 3.1 g/dL — ABNORMAL LOW (ref 3.5–5.0)
Alkaline Phosphatase: 80 U/L (ref 38–126)
Anion gap: 7 (ref 5–15)
BUN: 11 mg/dL (ref 6–20)
CO2: 24 mmol/L (ref 22–32)
Calcium: 8.4 mg/dL — ABNORMAL LOW (ref 8.9–10.3)
Chloride: 103 mmol/L (ref 98–111)
Creatinine, Ser: 0.97 mg/dL (ref 0.44–1.00)
GFR, Estimated: >60 mL/min (ref >60)
Glucose, Bld: 244 mg/dL — ABNORMAL HIGH (ref 70–99)
Potassium: 4.1 mmol/L (ref 3.5–5.1)
Sodium: 134 mmol/L — ABNORMAL LOW (ref 135–145)
Total Bilirubin: 0.3 mg/dL (ref 0.3–1.2)
Total Protein: 6.2 g/dL — ABNORMAL LOW (ref 6.5–8.1)

## 2022-08-17 LAB — CBC
HCT: 30.1 % — ABNORMAL LOW (ref 36.0–46.0)
Hemoglobin: 9.4 g/dL — ABNORMAL LOW (ref 12.0–15.0)
MCH: 28.6 pg (ref 26.0–34.0)
MCHC: 31.2 g/dL (ref 30.0–36.0)
MCV: 91.5 fL (ref 80.0–100.0)
Platelets: 382 10*3/uL (ref 150–400)
RBC: 3.29 MIL/uL — ABNORMAL LOW (ref 3.87–5.11)
RDW: 13.8 % (ref 11.5–15.5)
WBC: 3.7 10*3/uL — ABNORMAL LOW (ref 4.0–10.5)
nRBC: 0 % (ref 0.0–0.2)

## 2022-08-17 LAB — MAGNESIUM: Magnesium: 2.1 mg/dL (ref 1.7–2.4)

## 2022-08-17 MED ORDER — HEPARIN SOD (PORK) LOCK FLUSH 100 UNIT/ML IV SOLN: 250.0000 [IU] | Freq: Once | INTRAVENOUS | Status: AC

## 2022-08-17 MED ORDER — HEPARIN SOD (PORK) LOCK FLUSH 100 UNIT/ML IV SOLN: 500.0000 [IU] | Freq: Once | INTRAVENOUS | Status: DC

## 2022-08-17 MED ORDER — SODIUM CHLORIDE 0.9 % IV SOLN
510.0000 mg | INTRAVENOUS | Status: DC
  Administered 2022-08-17: 510 mg via INTRAVENOUS
  Filled 2022-08-17: qty 510

## 2022-08-17 MED ORDER — HEPARIN SOD (PORK) LOCK FLUSH 100 UNIT/ML IV SOLN
INTRAVENOUS | Status: AC
  Administered 2022-08-17: 250 [IU] via INTRAVENOUS
  Filled 2022-08-17: qty 5

## 2022-08-23 ENCOUNTER — Encounter: Payer: Self-pay | Admitting: Nurse Practitioner

## 2022-08-23 ENCOUNTER — Ambulatory Visit: Payer: Medicare Other | Attending: Nurse Practitioner | Admitting: Nurse Practitioner

## 2022-08-23 ENCOUNTER — Other Ambulatory Visit: Payer: Self-pay

## 2022-08-23 VITALS — BP 94/62 | HR 107 | Temp 98.1°F | Ht 62.0 in | Wt 141.8 lb

## 2022-08-23 DIAGNOSIS — J9801 Acute bronchospasm: Secondary | ICD-10-CM | POA: Insufficient documentation

## 2022-08-23 DIAGNOSIS — Z794 Long term (current) use of insulin: Secondary | ICD-10-CM | POA: Insufficient documentation

## 2022-08-23 DIAGNOSIS — E039 Hypothyroidism, unspecified: Secondary | ICD-10-CM | POA: Diagnosis not present

## 2022-08-23 DIAGNOSIS — E1143 Type 2 diabetes mellitus with diabetic autonomic (poly)neuropathy: Secondary | ICD-10-CM

## 2022-08-23 DIAGNOSIS — Z833 Family history of diabetes mellitus: Secondary | ICD-10-CM | POA: Diagnosis not present

## 2022-08-23 DIAGNOSIS — M25531 Pain in right wrist: Secondary | ICD-10-CM | POA: Insufficient documentation

## 2022-08-23 DIAGNOSIS — Z79890 Hormone replacement therapy (postmenopausal): Secondary | ICD-10-CM | POA: Insufficient documentation

## 2022-08-23 DIAGNOSIS — K3184 Gastroparesis: Secondary | ICD-10-CM | POA: Diagnosis not present

## 2022-08-23 DIAGNOSIS — E1059 Type 1 diabetes mellitus with other circulatory complications: Secondary | ICD-10-CM | POA: Insufficient documentation

## 2022-08-23 DIAGNOSIS — Z79899 Other long term (current) drug therapy: Secondary | ICD-10-CM | POA: Insufficient documentation

## 2022-08-23 DIAGNOSIS — E1043 Type 1 diabetes mellitus with diabetic autonomic (poly)neuropathy: Secondary | ICD-10-CM | POA: Diagnosis not present

## 2022-08-23 LAB — POCT GLYCOSYLATED HEMOGLOBIN (HGB A1C): HbA1c, POC (controlled diabetic range): 7.2 % — AB (ref 0.0–7.0)

## 2022-08-23 LAB — GLUCOSE, POCT (MANUAL RESULT ENTRY): POC Glucose: 181 mg/dl — AB (ref 70–99)

## 2022-08-23 MED ORDER — LEVOTHYROXINE SODIUM 100 MCG PO TABS
100.0000 ug | ORAL_TABLET | Freq: Every day | ORAL | 1 refills | Status: DC
Start: 2022-08-23 — End: 2022-11-09
  Filled 2022-08-23 – 2022-09-12 (×2): qty 90, 90d supply, fill #0

## 2022-08-23 MED ORDER — PANTOPRAZOLE SODIUM 40 MG PO TBEC
40.0000 mg | DELAYED_RELEASE_TABLET | Freq: Two times a day (BID) | ORAL | 2 refills | Status: DC
Start: 2022-08-23 — End: 2022-11-09
  Filled 2022-08-23: qty 60, 30d supply, fill #0
  Filled 2022-10-09: qty 60, 30d supply, fill #1

## 2022-08-23 MED ORDER — MONTELUKAST SODIUM 10 MG PO TABS
10.0000 mg | ORAL_TABLET | Freq: Every day | ORAL | 2 refills | Status: DC
Start: 2022-08-23 — End: 2022-11-09
  Filled 2022-08-23: qty 90, 90d supply, fill #0

## 2022-08-23 NOTE — Progress Notes (Signed)
Assessment & Plan:  Jennifer Hogan was seen today for right wrist pain.  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  Diabetes mellitus with gastroparesis (HCC) -     pantoprazole (PROTONIX) 40 MG tablet; Take 1 tablet (40 mg total) by mouth 2 (two) times daily.  Hypothyroidism, unspecified type Followed by ENDO -     levothyroxine (SYNTHROID) 100 MCG tablet; Take 1 tablet (100 mcg total) by mouth daily before breakfast.    Patient has been counseled on age-appropriate routine health concerns for screening and prevention. These are reviewed and up-to-date. Referrals have been placed accordingly. Immunizations are up-to-date or declined.    Subjective:   Chief Complaint  Patient presents with   Right wrist pain   HPI Jennifer Hogan 52 y.o. female presents to office today for right wrist pain.   Joint Pain: Patient complains of arthralgias for which has been present for a few months. Pain is located in  right radial wrist area , is described as aching, sharp, stabbing, and tight band, and is constant .  Associated symptoms include: none. Related to injury: no. She does endorse frequent repetitive movement using her phone frequently throughout the day. She has been wearing a wrist splint consistently for several weeks with no relief. Denies numbness or tingling in the fingertips.    Review of Systems  Constitutional:  Negative for fever, malaise/fatigue and weight loss.  HENT: Negative.  Negative for nosebleeds.   Eyes: Negative.  Negative for blurred vision, double vision and photophobia.  Respiratory:  Negative.  Negative for cough and shortness of breath.   Cardiovascular: Negative.  Negative for chest pain, palpitations and leg swelling.  Gastrointestinal: Negative.  Negative for heartburn, nausea and vomiting.  Musculoskeletal:  Positive for joint pain. Negative for myalgias.  Neurological: Negative.  Negative for dizziness, focal weakness, seizures and headaches.  Psychiatric/Behavioral: Negative.  Negative for suicidal ideas.     Past Medical History:  Diagnosis Date   Allergy    Anemia    Anxiety    Asthma    CAD S/P percutaneous coronary angioplasty 03/28/2017   Cataract    CHF (congestive heart failure) (HCC)    Coronary artery disease    a. s/p PTCA DES x3 in the LAD (ostial DES placed, mid DES placed, distal DES placed) and PTCA/DES x1 to intermediate branch 02/2017. b. ACS 02/08/18 with LCx stent placement.   Depression    Diabetes mellitus    type 1   DKA (diabetic ketoacidoses) 12/31/2017   DKA (diabetic ketoacidosis) (HCC) 01/13/2022   Dyspnea    with exertion and dust, no oxygen   Elevated platelet count    GERD (gastroesophageal reflux disease)    Heart murmur    never caused any problems   High cholesterol    Hypothyroidism    Ischemic cardiomyopathy    a. EF low-normal with basal inferior akinesis, basal septal hypokinesis by echo 01/2018.   Myocardial infarction (HCC) 2019   Nausea and vomiting 12/04/2010   Non-ST elevation (NSTEMI) myocardial  infarction [facility]) 02/08/2018   NSTEMI (non-ST elevated myocardial infarction) (HCC)    Substance abuse (HCC)    marijuana    Past Surgical History:  Procedure Laterality Date   25 GAUGE PARS PLANA VITRECTOMY WITH 20 GAUGE MVR PORT Right 11/24/2021   Procedure: 25 GAUGE PARS PLANA VITRECTOMY APPROACH RIGHT EYE, FLUID FLUID EXCHANGE;  Surgeon: Carmela Rima, MD;  Location: [facility] OR;  Service: Ophthalmology;  Laterality: Right;   AIR/FLUID EXCHANGE Right 12/27/2021   Procedure: AIR/FLUID EXCHANGE;  Surgeon: Carmela Rima, MD;  Location: [facility] OR;  Service: Ophthalmology;  Laterality: Right;   APPENDECTOMY     BIOPSY  09/29/2021   Procedure: BIOPSY;  Surgeon: Benancio Deeds, MD;  Location: [facility];  Service: Gastroenterology;;   CATARACT EXTRACTION W/PHACO Right 11/24/2021   Procedure: CATARACT EXTRACTION AND INTRAOCULAR LENS PLACEMENT (IOC) RIGHT EYE;  Surgeon: Carmela Rima, MD;  Location: [facility] OR;  Service: Ophthalmology;  Laterality: Right;   COLONOSCOPY     COLONOSCOPY WITH PROPOFOL N/A 09/29/2021   Procedure: COLONOSCOPY WITH PROPOFOL;  Surgeon: Benancio Deeds, MD;  Location: [facility] ENDOSCOPY;  Service: Gastroenterology;  Laterality: N/A;   CORONARY ARTERY BYPASS GRAFT N/A 11/04/2018   Procedure: CORONARY ARTERY BYPASS GRAFTING (CABG), ON PUMP, TIMES THREE, USING LEFT AND RIGHT INTERNAL MAMMARY ARTERIES;  Surgeon: Linden Dolin, MD;  Location: [facility] OR;  Service: Open Heart Surgery;  Laterality: N/A;   CORONARY STENT INTERVENTION N/A 03/20/2017   Procedure: DES x 3 LAD, DES OM; Surgeon: Kathleene Hazel, MD;  Location: [facility] INVASIVE CV LAB;  Service: Cardiovascular;  Laterality: N/A;   CORONARY/GRAFT ACUTE MI REVASCULARIZATION N/A 02/08/2018   Procedure: Coronary/Graft Acute MI Revascularization;  Surgeon: Lyn Records, MD;  Location: [facility] INVASIVE CV LAB;  Service: Cardiovascular;  Laterality: N/A;   ESOPHAGOGASTRODUODENOSCOPY (EGD) WITH PROPOFOL N/A 09/29/2021   Procedure: ESOPHAGOGASTRODUODENOSCOPY (EGD) WITH PROPOFOL;  Surgeon: Benancio Deeds, MD;  Location: [facility]OSCOPY;  Service: Gastroenterology;  Laterality: N/A;   GAS INSERTION Right 04/28/2021   Procedure: INSERTION OF GAS ( C3F8);  Surgeon: Carmela Rima, MD;  Location: [facility] OR;  Service: Ophthalmology;  Laterality: Right;  Right eye   GAS/FLUID EXCHANGE Right 04/28/2021   Procedure: GAS/FLUID EXCHANGE;  Surgeon: Carmela Rima, MD;  Location: [facility] OR;  Service: Ophthalmology;  Laterality: Right;  Right eye   IR FLUORO  GUIDE CV LINE RIGHT  05/29/2022   IR US GUIDE VASC ACCESS RIGHT  05/30/2022   LASER PHOTO ABLATION Right 11/24/2021   Procedure: ENDO LASER PAN PHOTOCOAGULATION;  Surgeon: Carmela Rima, MD;  Location: [facility] OR;  Service: Ophthalmology;  Laterality: Right;   LASER PHOTO ABLATION Right 12/27/2021   Procedure: ENDO LASER;  Surgeon: Carmela Rima, MD;  Location: [facility] OR;  Service: Ophthalmology;  Laterality: Right;   LEFT HEART CATH AND CORONARY ANGIOGRAPHY N/A 03/20/2017   Procedure: LEFT HEART CATH AND CORONARY ANGIOGRAPHY;  Surgeon: Kathleene Hazel, MD;  Location: [facility]VAS[facility] CV LAB;  Service: Cardiovascular;  Laterality: N/A;   LEFT HEART CATH AND CORONARY ANGIOGRAPHY N/A 02/08/2018   Procedure: LEFT HEART CATH AND CORONARY ANGIOGRAPHY;  Surgeon: Lyn Records, MD;  Location: [facility]VAS[facility] CV LAB;  Service: Cardiovascular;  Laterality: N/A;   LEFT HEART CATH AND CORONARY ANGIOGRAPHY N/A 10/29/2018   Procedure: LEFT HEART CATH AND CORONARY ANGIOGRAPHY;  Surgeon: Kathleene Hazel, MD;  Location: [facility];  Service: Cardiovascular;  Laterality: N/A;   LEFT HEART CATH AND CORS/GRAFTS ANGIOGRAPHY N/A 10/04/2020   Procedure:  LEFT HEART CATH AND CORS/GRAFTS ANGIOGRAPHY;  Surgeon: Tonny Bollman, MD;  Location: [facility] INVASIVE CV LAB;  Service: Cardiovascular;  Laterality: N/A;   LEFT HEART CATH AND CORS/GRAFTS ANGIOGRAPHY N/A 05/11/2021   Procedure: LEFT HEART CATH AND CORS/GRAFTS ANGIOGRAPHY;  Surgeon: [place]az[place], Peter M, MD;  Location: [facility] INVASIVE CV LAB;  Service: Cardiovascular;  Laterality: N/A;   PARS PLANA VITRECTOMY Right 04/28/2021   Procedure: PARS PLANA VITRECTOMY WITH 25 GAUGE REMOVAL OF TRATIONAL MEMBRANE RIGHT EYE; DRAINAGE OF SUBRETINAL FLUID RIGHT EYE;  Surgeon: Carmela Rima, MD;  Location: [facility] OR;  Service: Ophthalmology;  Laterality: Right;  Right eye   PARS PLANA VITRECTOMY 27 GAUGE Right 12/27/2021   Procedure: PARS PLANA VITRECTOMY 27 GAUGE;  Surgeon: Carmela Rima,  MD;  Location: [facility] OR;  Service: Ophthalmology;  Laterality: Right;   PHOTOCOAGULATION WITH LASER Right 04/28/2021   Procedure: PHOTOCOAGULATION WITH LASER;  Surgeon: Carmela Rima, MD;  Location: [facility] OR;  Service: Ophthalmology;  Laterality: Right;  Right eye   POLYPECTOMY  09/29/2021   Procedure: POLYPECTOMY;  Surgeon: Benancio Deeds, MD;  Location: [facility] ENDOSCOPY;  Service: Gastroenterology;;   RIGHT/LEFT HEART CATH AND CORONARY/GRAFT ANGIOGRAPHY N/A 05/23/2022   Procedure: RIGHT/LEFT HEART CATH AND CORONARY/GRAFT ANGIOGRAPHY;  Surgeon: Kathleene Hazel, MD;  Location: [facility] INVASIVE CV LAB;  Service: Cardiovascular;  Laterality: N/A;   TEE WITHOUT CARDIOVERSION N/A 11/04/2018   Procedure: TRANSESOPHAGEAL ECHOCARDIOGRAM (TEE);  Surgeon: Linden Dolin, MD;  Location: [facility] OR;  Service: Open Heart Surgery;  Laterality: N/A;    Family History  Problem Relation Age of Onset   Cancer Mother        T cell lymphoma   Hypertension Mother    Hypercalcemia Mother    OCD Father    Anxiety disorder Father    Coronary artery disease Father    Congestive Heart Failure Father    Asthma Father    Hypercalcemia Father    Hypertension Father    Colon polyps Father    Depression Maternal Grandmother    Diabetes Paternal Grandmother    Stomach cancer Neg Hx    Esophageal cancer Neg Hx    Colon cancer Neg Hx    Rectal cancer Neg Hx     Social History Reviewed with no changes to be made today.   Outpatient Medications Prior to Visit  Medication Sig Dispense Refill   albuterol (VENTOLIN HFA) 108 (90 Base) MCG/ACT inhaler INHALE 2 PUFFS INTO THE LUNGS EVERY 6 (SIX) HOURS AS NEEDED FOR WHEEZING OR SHORTNESS OF BREATH. 18 g 2   Alirocumab (PRALUENT) 150 MG/ML SOAJ Inject 1 pen  into the skin every 14 (fourteen) days. 2 mL 11   apixaban (ELIQUIS) 5 MG TABS tablet Take 1 tablet (5 mg total) by mouth 2 (two) times daily. 60 tablet 11   aspirin 81 MG EC tablet Take 1 tablet (81 mg total) by mouth  daily. (Patient taking differently: Take 81 mg by mouth at bedtime.) 90 tablet 0   buPROPion (WELLBUTRIN XL) 150 MG 24 hr tablet Take 1 tablet (150 mg total) by mouth every morning. 30 tablet 1   Continuous Glucose Sensor (DEXCOM G6 SENSOR) MISC Use as instructed to check glucose levels. Change every 10 days. 9 each 1   Continuous Glucose Transmitter (DEXCOM G6 TRANSMITTER) MISC Use as instructed to check glucose levels. Change every 90 days. 1 each 1   digoxin (LANOXIN) 0.125 MG tablet Take 1 tablet (0.125 mg total) by mouth daily. 30 tablet 6  diphenhydrAMINE (BENADRYL) 25 MG tablet Take 37.5 mg by mouth at bedtime.     DOBUTamine (DOBUTREX) 4-5 MG/ML-% infusion Inject 191.4 mcg/min into the vein continuous. Per [facility] home infusion 200 mL 52   DULoxetine (CYMBALTA) 30 MG capsule Take 1 capsule (30 mg total) by mouth at bedtime. 30 capsule 1   fluticasone (FLONASE) 50 MCG/ACT nasal spray Insert 1 spray each nostril daily for 3 days then use 2-3 times a week as needed (Patient taking differently: Place 1 spray into both nostrils daily as needed for allergies.) 16 g 1   Glucagon 3 MG/DOSE POWD Place 3 mg into the nose once as needed for up to 1 dose. 1 each 11   Insulin Disposable Pump (OMNIPOD 5 G6 INTRO, GEN 5,) KIT Use as directed. 1 kit 0   Insulin Disposable Pump (OMNIPOD 5 G6 PODS, GEN 5,) MISC Use every 3 (three) days. 30 each 3   insulin lispro (HUMALOG) 100 UNIT/ML injection Inject 0.35 mLs (35 Units total) into the skin daily via pump. (Patient taking differently: Inject 35 Units into the skin See admin instructions. 35 units daily via insulin pump) 30 mL 1   losartan (COZAAR) 25 MG tablet Take 1/2 tablet (12.5 mg total) by mouth daily. 30 tablet 6   meloxicam (MOBIC) 15 MG tablet Take 15 mg by mouth daily as needed for pain.     metoCLOPramide (REGLAN) 5 MG tablet Take 1 tablet (5 mg total) by mouth every 8 (eight) hours as needed for nausea or vomiting. 60 tablet 3   mometasone-formoterol  (DULERA) 100-5 MCG/ACT AERO Inhale 2 puffs into the lungs 2 (two) times daily. 13 g 6   Multiple Vitamin (MULTIVITAMIN WITH MINERALS) TABS tablet Take 1 tablet by mouth daily with lunch.     nitroGLYCERIN (NITROSTAT) 0.4 MG SL tablet Place 1 tablet (0.4 mg total) under the tongue every 5 (five) minutes as needed for chest pain. 25 tablet 6   ondansetron (ZOFRAN) 4 MG tablet Take 1 tablet (4 mg total) by mouth daily as needed for nausea or vomiting. 30 tablet 1   spironolactone (ALDACTONE) 25 MG tablet Take 25 mg by mouth daily.     levothyroxine (SYNTHROID) 100 MCG tablet Take 1 tablet (100 mcg total) by mouth daily before breakfast. 30 tablet 0   montelukast (SINGULAIR) 10 MG tablet Take 1 tablet (10 mg total) by mouth at bedtime. 90 tablet 2   pantoprazole (PROTONIX) 40 MG tablet Take 1 tablet (40 mg total) by mouth 2 (two) times daily. 60 tablet 2   No facility-administered medications prior to visit.    Allergies  Allergen Reactions   Crestor [Rosuvastatin Calcium] Other (See Comments)    Myalgias  Arthralgias    Lipitor [Atorvastatin] Other (See Comments)    Myalgias    Monosodium Glutamate Nausea And Vomiting and Other (See Comments)    MSG - migraine   Zetia [Ezetimibe] Other (See Comments)    Myalgias    Ciprofloxin Hcl [Ciprofloxacin] Nausea And Vomiting and Other (See Comments)    Severe migraine   Food Color Red [Red Dye] Diarrhea       Objective:    BP 94/62 (BP Location: Left Arm, Patient Position: Sitting, Cuff Size: Normal)   Pulse (!) 107   Temp 98.1 F (36.7 C) (Oral)   Ht 5\' 2"  (1.575 m)   Wt 141 lb 12.8 oz (64.3 kg)   SpO2 100%   BMI 25.94 kg/m  Wt Readings from Last 3 Encounters:  08/23/22 141 lb 12.8 oz (64.3 kg)  06/27/22 128 lb (58.1 kg)  06/15/22 129 lb 12.8 oz (58.9 kg)    Physical Exam Vitals and nursing note reviewed.  Constitutional:      Appearance: She is well-developed.  HENT:     Head: Normocephalic and atraumatic.  Cardiovascular:      Rate and Rhythm: Normal rate and regular rhythm.     Heart sounds: Normal heart sounds. No murmur heard.    No friction rub. No gallop.  Pulmonary:     Effort: Pulmonary effort is normal. No tachypnea or respiratory distress.     Breath sounds: Normal breath sounds. No decreased breath sounds, wheezing, rhonchi or rales.  Chest:     Chest wall: No tenderness.  Abdominal:     General: Bowel sounds are normal.     Palpations: Abdomen is soft.  Musculoskeletal:     Right wrist: Tenderness present. No swelling or deformity. Decreased range of motion.     Left wrist: Normal.     Cervical back: Normal range of motion.  Skin:    General: Skin is warm and dry.  Neurological:     Mental Status: She is alert and oriented to person, place, and time.     Coordination: Coordination normal.  Psychiatric:        Behavior: Behavior normal. Behavior is cooperative.        Thought Content: Thought content normal.        Judgment: Judgment normal.          Patient has been counseled extensively about nutrition and exercise as well as the importance of adherence with medications and regular follow-up. The patient was given clear instructions to go to ER or return to medical center if symptoms don't improve, worsen or new problems develop. The patient verbalized understanding.   Follow-up: Return in about 3 months (around 11/23/2022).   [name], FNP-BC [facility] [place], [place] [phone]   08/23/2022, 1:32 PM

## 2022-08-24 ENCOUNTER — Other Ambulatory Visit: Payer: Self-pay

## 2022-08-24 ENCOUNTER — Ambulatory Visit
Admission: RE | Admit: 2022-08-24 | Discharge: 2022-08-24 | Disposition: A | Discharge status: Home / Self Care | Payer: Medicare Other | Source: Ambulatory Visit | Attending: Nurse Practitioner | Admitting: Nurse Practitioner

## 2022-08-24 ENCOUNTER — Encounter (HOSPITAL_COMMUNITY)
Admission: RE | Admit: 2022-08-24 | Discharge: 2022-08-24 | Disposition: A | Discharge status: Home / Self Care | Payer: Medicare Other | Source: Ambulatory Visit | Attending: Cardiology | Admitting: Cardiology

## 2022-08-24 ENCOUNTER — Encounter: Payer: Self-pay | Admitting: Internal Medicine

## 2022-08-24 DIAGNOSIS — E1059 Type 1 diabetes mellitus with other circulatory complications: Secondary | ICD-10-CM

## 2022-08-24 DIAGNOSIS — I255 Ischemic cardiomyopathy: Secondary | ICD-10-CM | POA: Insufficient documentation

## 2022-08-24 DIAGNOSIS — I429 Cardiomyopathy, unspecified: Secondary | ICD-10-CM | POA: Diagnosis present

## 2022-08-24 DIAGNOSIS — M25531 Pain in right wrist: Secondary | ICD-10-CM

## 2022-08-24 LAB — COMPREHENSIVE METABOLIC PANEL
ALT: 43 U/L (ref 0–44)
AST: 50 U/L — ABNORMAL HIGH (ref 15–41)
Albumin: 3.7 g/dL (ref 3.5–5.0)
Alkaline Phosphatase: 92 U/L (ref 38–126)
Anion gap: 10 (ref 5–15)
BUN: 11 mg/dL (ref 6–20)
CO2: 23 mmol/L (ref 22–32)
Calcium: 9.2 mg/dL (ref 8.9–10.3)
Chloride: 102 mmol/L (ref 98–111)
Creatinine, Ser: 1.02 mg/dL — ABNORMAL HIGH (ref 0.44–1.00)
GFR, Estimated: >60 mL/min (ref >60)
Glucose, Bld: 146 mg/dL — ABNORMAL HIGH (ref 70–99)
Potassium: 3.9 mmol/L (ref 3.5–5.1)
Sodium: 135 mmol/L (ref 135–145)
Total Bilirubin: 0.6 mg/dL (ref 0.3–1.2)
Total Protein: 7.2 g/dL (ref 6.5–8.1)

## 2022-08-24 LAB — CBC
HCT: 35 % — ABNORMAL LOW (ref 36.0–46.0)
Hemoglobin: 11.4 g/dL — ABNORMAL LOW (ref 12.0–15.0)
MCH: 30.3 pg (ref 26.0–34.0)
MCHC: 32.6 g/dL (ref 30.0–36.0)
MCV: 93.1 fL (ref 80.0–100.0)
Platelets: 404 10*3/uL — ABNORMAL HIGH (ref 150–400)
RBC: 3.76 MIL/uL — ABNORMAL LOW (ref 3.87–5.11)
RDW: 15.1 % (ref 11.5–15.5)
WBC: 4.6 10*3/uL (ref 4.0–10.5)
nRBC: 0 % (ref 0.0–0.2)

## 2022-08-24 LAB — MAGNESIUM: Magnesium: 2.1 mg/dL (ref 1.7–2.4)

## 2022-08-28 ENCOUNTER — Other Ambulatory Visit: Payer: Self-pay

## 2022-08-28 MED ORDER — DEXCOM G6 SENSOR MISC
1 refills | Status: DC
Start: 2022-08-28 — End: 2022-11-09

## 2022-08-30 ENCOUNTER — Other Ambulatory Visit: Payer: Self-pay

## 2022-08-30 MED ORDER — MELOXICAM 15 MG PO TABS
15.0000 mg | ORAL_TABLET | Freq: Every day | ORAL | 1 refills | Status: DC
  Filled 2022-08-30: qty 30, 30d supply, fill #0

## 2022-08-31 ENCOUNTER — Ambulatory Visit (HOSPITAL_COMMUNITY)
Admission: RE | Admit: 2022-08-31 | Discharge: 2022-08-31 | Disposition: A | Discharge status: Home / Self Care | Payer: Medicare Other | Source: Ambulatory Visit | Attending: Cardiology | Admitting: Cardiology

## 2022-08-31 ENCOUNTER — Other Ambulatory Visit: Payer: Self-pay

## 2022-08-31 ENCOUNTER — Other Ambulatory Visit (HOSPITAL_COMMUNITY): Payer: Self-pay

## 2022-08-31 DIAGNOSIS — I429 Cardiomyopathy, unspecified: Secondary | ICD-10-CM | POA: Diagnosis present

## 2022-08-31 DIAGNOSIS — I255 Ischemic cardiomyopathy: Secondary | ICD-10-CM | POA: Diagnosis not present

## 2022-08-31 LAB — CBC
HCT: 33.3 % — ABNORMAL LOW (ref 36.0–46.0)
Hemoglobin: 10.9 g/dL — ABNORMAL LOW (ref 12.0–15.0)
MCH: 29.9 pg (ref 26.0–34.0)
MCHC: 32.7 g/dL (ref 30.0–36.0)
MCV: 91.5 fL (ref 80.0–100.0)
Platelets: 361 10*3/uL (ref 150–400)
RBC: 3.64 MIL/uL — ABNORMAL LOW (ref 3.87–5.11)
RDW: 15.3 % (ref 11.5–15.5)
WBC: 4.8 10*3/uL (ref 4.0–10.5)
nRBC: 0 % (ref 0.0–0.2)

## 2022-08-31 LAB — COMPREHENSIVE METABOLIC PANEL
ALT: 21 U/L (ref 0–44)
AST: 24 U/L (ref 15–41)
Albumin: 3.6 g/dL (ref 3.5–5.0)
Alkaline Phosphatase: 76 U/L (ref 38–126)
Anion gap: 12 (ref 5–15)
BUN: 14 mg/dL (ref 6–20)
CO2: 21 mmol/L — ABNORMAL LOW (ref 22–32)
Calcium: 8.9 mg/dL (ref 8.9–10.3)
Chloride: 104 mmol/L (ref 98–111)
Creatinine, Ser: 0.98 mg/dL (ref 0.44–1.00)
GFR, Estimated: >60 mL/min (ref >60)
Glucose, Bld: 148 mg/dL — ABNORMAL HIGH (ref 70–99)
Potassium: 4 mmol/L (ref 3.5–5.1)
Sodium: 137 mmol/L (ref 135–145)
Total Bilirubin: <0.1 mg/dL — ABNORMAL LOW (ref 0.3–1.2)
Total Protein: 6.7 g/dL (ref 6.5–8.1)

## 2022-08-31 LAB — MAGNESIUM: Magnesium: 2 mg/dL (ref 1.7–2.4)

## 2022-09-02 ENCOUNTER — Other Ambulatory Visit (HOSPITAL_COMMUNITY): Payer: Self-pay

## 2022-09-05 ENCOUNTER — Encounter (HOSPITAL_BASED_OUTPATIENT_CLINIC_OR_DEPARTMENT_OTHER): Payer: Self-pay

## 2022-09-05 ENCOUNTER — Emergency Department (HOSPITAL_BASED_OUTPATIENT_CLINIC_OR_DEPARTMENT_OTHER): Payer: Medicare Other

## 2022-09-05 ENCOUNTER — Emergency Department (HOSPITAL_BASED_OUTPATIENT_CLINIC_OR_DEPARTMENT_OTHER): Payer: Medicare Other | Admitting: Radiology

## 2022-09-05 ENCOUNTER — Other Ambulatory Visit: Payer: Self-pay

## 2022-09-05 ENCOUNTER — Inpatient Hospital Stay (HOSPITAL_BASED_OUTPATIENT_CLINIC_OR_DEPARTMENT_OTHER)
Admission: EM | Admit: 2022-09-05 | Discharge: 2022-09-11 | DRG: 280 | Disposition: A | Discharge status: Home Health | Payer: Medicare Other | Attending: Internal Medicine | Admitting: Internal Medicine

## 2022-09-05 DIAGNOSIS — R7989 Other specified abnormal findings of blood chemistry: Secondary | ICD-10-CM | POA: Diagnosis not present

## 2022-09-05 DIAGNOSIS — Z951 Presence of aortocoronary bypass graft: Secondary | ICD-10-CM

## 2022-09-05 DIAGNOSIS — I21A1 Myocardial infarction type 2: Secondary | ICD-10-CM | POA: Diagnosis present

## 2022-09-05 DIAGNOSIS — D649 Anemia, unspecified: Secondary | ICD-10-CM | POA: Diagnosis present

## 2022-09-05 DIAGNOSIS — Z7901 Long term (current) use of anticoagulants: Secondary | ICD-10-CM

## 2022-09-05 DIAGNOSIS — I429 Cardiomyopathy, unspecified: Secondary | ICD-10-CM | POA: Diagnosis not present

## 2022-09-05 DIAGNOSIS — Z8249 Family history of ischemic heart disease and other diseases of the circulatory system: Secondary | ICD-10-CM

## 2022-09-05 DIAGNOSIS — E7800 Pure hypercholesterolemia, unspecified: Secondary | ICD-10-CM | POA: Diagnosis present

## 2022-09-05 DIAGNOSIS — Z7982 Long term (current) use of aspirin: Secondary | ICD-10-CM

## 2022-09-05 DIAGNOSIS — E860 Dehydration: Secondary | ICD-10-CM | POA: Diagnosis present

## 2022-09-05 DIAGNOSIS — N3000 Acute cystitis without hematuria: Secondary | ICD-10-CM | POA: Diagnosis not present

## 2022-09-05 DIAGNOSIS — Z9641 Presence of insulin pump (external) (internal): Secondary | ICD-10-CM | POA: Diagnosis present

## 2022-09-05 DIAGNOSIS — D75839 Thrombocytosis, unspecified: Secondary | ICD-10-CM | POA: Diagnosis present

## 2022-09-05 DIAGNOSIS — K219 Gastro-esophageal reflux disease without esophagitis: Secondary | ICD-10-CM | POA: Diagnosis present

## 2022-09-05 DIAGNOSIS — R110 Nausea: Secondary | ICD-10-CM

## 2022-09-05 DIAGNOSIS — T85694A Other mechanical complication of insulin pump, initial encounter: Secondary | ICD-10-CM | POA: Diagnosis not present

## 2022-09-05 DIAGNOSIS — E039 Hypothyroidism, unspecified: Secondary | ICD-10-CM | POA: Diagnosis present

## 2022-09-05 DIAGNOSIS — Z888 Allergy status to other drugs, medicaments and biological substances status: Secondary | ICD-10-CM | POA: Diagnosis not present

## 2022-09-05 DIAGNOSIS — I110 Hypertensive heart disease with heart failure: Secondary | ICD-10-CM | POA: Diagnosis present

## 2022-09-05 DIAGNOSIS — I5023 Acute on chronic systolic (congestive) heart failure: Secondary | ICD-10-CM | POA: Diagnosis not present

## 2022-09-05 DIAGNOSIS — Z794 Long term (current) use of insulin: Secondary | ICD-10-CM | POA: Diagnosis not present

## 2022-09-05 DIAGNOSIS — I5084 End stage heart failure: Secondary | ICD-10-CM | POA: Diagnosis present

## 2022-09-05 DIAGNOSIS — Z87891 Personal history of nicotine dependence: Secondary | ICD-10-CM | POA: Diagnosis not present

## 2022-09-05 DIAGNOSIS — Z791 Long term (current) use of non-steroidal anti-inflammatories (NSAID): Secondary | ICD-10-CM

## 2022-09-05 DIAGNOSIS — I214 Non-ST elevation (NSTEMI) myocardial infarction: Secondary | ICD-10-CM | POA: Diagnosis present

## 2022-09-05 DIAGNOSIS — Z79890 Hormone replacement therapy (postmenopausal): Secondary | ICD-10-CM | POA: Diagnosis not present

## 2022-09-05 DIAGNOSIS — Z1152 Encounter for screening for COVID-19: Secondary | ICD-10-CM

## 2022-09-05 DIAGNOSIS — F411 Generalized anxiety disorder: Secondary | ICD-10-CM | POA: Diagnosis present

## 2022-09-05 DIAGNOSIS — E1010 Type 1 diabetes mellitus with ketoacidosis without coma: Secondary | ICD-10-CM | POA: Diagnosis present

## 2022-09-05 DIAGNOSIS — Z833 Family history of diabetes mellitus: Secondary | ICD-10-CM

## 2022-09-05 DIAGNOSIS — E8809 Other disorders of plasma-protein metabolism, not elsewhere classified: Secondary | ICD-10-CM | POA: Diagnosis present

## 2022-09-05 DIAGNOSIS — E1043 Type 1 diabetes mellitus with diabetic autonomic (poly)neuropathy: Secondary | ICD-10-CM | POA: Diagnosis present

## 2022-09-05 DIAGNOSIS — I255 Ischemic cardiomyopathy: Secondary | ICD-10-CM | POA: Diagnosis present

## 2022-09-05 DIAGNOSIS — R079 Chest pain, unspecified: Secondary | ICD-10-CM | POA: Diagnosis not present

## 2022-09-05 DIAGNOSIS — I2510 Atherosclerotic heart disease of native coronary artery without angina pectoris: Secondary | ICD-10-CM | POA: Diagnosis present

## 2022-09-05 DIAGNOSIS — Z7951 Long term (current) use of inhaled steroids: Secondary | ICD-10-CM

## 2022-09-05 DIAGNOSIS — Z955 Presence of coronary angioplasty implant and graft: Secondary | ICD-10-CM

## 2022-09-05 DIAGNOSIS — Z86718 Personal history of other venous thrombosis and embolism: Secondary | ICD-10-CM | POA: Diagnosis not present

## 2022-09-05 DIAGNOSIS — N179 Acute kidney failure, unspecified: Secondary | ICD-10-CM | POA: Diagnosis present

## 2022-09-05 DIAGNOSIS — Z825 Family history of asthma and other chronic lower respiratory diseases: Secondary | ICD-10-CM

## 2022-09-05 DIAGNOSIS — Z79899 Other long term (current) drug therapy: Secondary | ICD-10-CM

## 2022-09-05 DIAGNOSIS — E861 Hypovolemia: Secondary | ICD-10-CM | POA: Diagnosis not present

## 2022-09-05 DIAGNOSIS — I509 Heart failure, unspecified: Secondary | ICD-10-CM | POA: Diagnosis not present

## 2022-09-05 DIAGNOSIS — R3989 Other symptoms and signs involving the genitourinary system: Secondary | ICD-10-CM | POA: Diagnosis not present

## 2022-09-05 DIAGNOSIS — K3184 Gastroparesis: Secondary | ICD-10-CM | POA: Diagnosis present

## 2022-09-05 LAB — URINALYSIS, ROUTINE W REFLEX MICROSCOPIC
Bacteria, UA: NONE SEEN
Bilirubin Urine: NEGATIVE
Glucose, UA: NEGATIVE mg/dL
Ketones, ur: >80 mg/dL — AB
Leukocytes,Ua: NEGATIVE
Nitrite: NEGATIVE
Protein, ur: 100 mg/dL — AB
Specific Gravity, Urine: >1.046 — ABNORMAL HIGH (ref 1.005–1.030)
pH: 6.5 (ref 5.0–8.0)

## 2022-09-05 LAB — COMPREHENSIVE METABOLIC PANEL
ALT: 25 U/L (ref 0–44)
AST: 33 U/L (ref 15–41)
Albumin: 4.9 g/dL (ref 3.5–5.0)
Alkaline Phosphatase: 105 U/L (ref 38–126)
Anion gap: 15 (ref 5–15)
BUN: 23 mg/dL — ABNORMAL HIGH (ref 6–20)
CO2: 21 mmol/L — ABNORMAL LOW (ref 22–32)
Calcium: 10.6 mg/dL — ABNORMAL HIGH (ref 8.9–10.3)
Chloride: 98 mmol/L (ref 98–111)
Creatinine, Ser: 1.16 mg/dL — ABNORMAL HIGH (ref 0.44–1.00)
GFR, Estimated: 56 mL/min — ABNORMAL LOW (ref >60)
Glucose, Bld: 173 mg/dL — ABNORMAL HIGH (ref 70–99)
Potassium: 3.7 mmol/L (ref 3.5–5.1)
Sodium: 134 mmol/L — ABNORMAL LOW (ref 135–145)
Total Bilirubin: 0.7 mg/dL (ref 0.3–1.2)
Total Protein: 8.8 g/dL — ABNORMAL HIGH (ref 6.5–8.1)

## 2022-09-05 LAB — I-STAT VENOUS BLOOD GAS, ED
Acid-base deficit: 2 mmol/L (ref 0.0–2.0)
Bicarbonate: 20.6 mmol/L (ref 20.0–28.0)
Calcium, Ion: 1.19 mmol/L (ref 1.15–1.40)
HCT: 41 % (ref 36.0–46.0)
Hemoglobin: 13.9 g/dL (ref 12.0–15.0)
O2 Saturation: 72 %
Patient temperature: 97.8
Potassium: 3.7 mmol/L (ref 3.5–5.1)
Sodium: 136 mmol/L (ref 135–145)
TCO2: 21 mmol/L — ABNORMAL LOW (ref 22–32)
pCO2, Ven: 29.2 mmHg — ABNORMAL LOW (ref 44–60)
pH, Ven: 7.454 — ABNORMAL HIGH (ref 7.25–7.43)
pO2, Ven: 35 mmHg (ref 32–45)

## 2022-09-05 LAB — CBC
HCT: 37.3 % (ref 36.0–46.0)
Hemoglobin: 12.9 g/dL (ref 12.0–15.0)
MCH: 30.1 pg (ref 26.0–34.0)
MCHC: 34.6 g/dL (ref 30.0–36.0)
MCV: 87.1 fL (ref 80.0–100.0)
Platelets: 433 10*3/uL — ABNORMAL HIGH (ref 150–400)
RBC: 4.28 MIL/uL (ref 3.87–5.11)
RDW: 15.9 % — ABNORMAL HIGH (ref 11.5–15.5)
WBC: 13.4 10*3/uL — ABNORMAL HIGH (ref 4.0–10.5)
nRBC: 0 % (ref 0.0–0.2)

## 2022-09-05 LAB — RESP PANEL BY RT-PCR (RSV, FLU A&B, COVID)  RVPGX2
Influenza A by PCR: NEGATIVE
Influenza B by PCR: NEGATIVE
Resp Syncytial Virus by PCR: NEGATIVE
SARS Coronavirus 2 by RT PCR: NEGATIVE

## 2022-09-05 LAB — DIGOXIN LEVEL: Digoxin Level: 0.5 ng/mL — ABNORMAL LOW (ref 0.8–2.0)

## 2022-09-05 LAB — CBG MONITORING, ED
Glucose-Capillary: 167 mg/dL — ABNORMAL HIGH (ref 70–99)
Glucose-Capillary: 199 mg/dL — ABNORMAL HIGH (ref 70–99)
Glucose-Capillary: 178 mg/dL — ABNORMAL HIGH (ref 70–99)

## 2022-09-05 LAB — MRSA NEXT GEN BY PCR, NASAL: MRSA by PCR Next Gen: NOT DETECTED

## 2022-09-05 LAB — LACTIC ACID, PLASMA
Lactic Acid, Venous: 2.1 mmol/L (ref 0.5–1.9)
Lactic Acid, Venous: 1.2 mmol/L (ref 0.5–1.9)

## 2022-09-05 LAB — BETA-HYDROXYBUTYRIC ACID: Beta-Hydroxybutyric Acid: 1.48 mmol/L — ABNORMAL HIGH (ref 0.05–0.27)

## 2022-09-05 LAB — TROPONIN I (HIGH SENSITIVITY)
Troponin I (High Sensitivity): 1217 ng/L (ref <18)
Troponin I (High Sensitivity): 1216 ng/L (ref <18)

## 2022-09-05 LAB — HEPARIN LEVEL (UNFRACTIONATED): Heparin Unfractionated: >1.1 IU/mL — ABNORMAL HIGH (ref 0.30–0.70)

## 2022-09-05 LAB — LIPASE, BLOOD: Lipase: <10 U/L — ABNORMAL LOW (ref 11–51)

## 2022-09-05 LAB — APTT: aPTT: 60 seconds — ABNORMAL HIGH (ref 24–36)

## 2022-09-05 MED ORDER — METOCLOPRAMIDE HCL 10 MG PO TABS
5.0000 mg | ORAL_TABLET | Freq: Three times a day (TID) | ORAL | Status: DC | PRN
  Filled 2022-09-05: qty 1

## 2022-09-05 MED ORDER — CHLORHEXIDINE GLUCONATE CLOTH 2 % EX PADS
6.0000 | MEDICATED_PAD | Freq: Every day | CUTANEOUS | Status: DC
  Administered 2022-09-06 – 2022-09-09 (×4): 6 via TOPICAL

## 2022-09-05 MED ORDER — DIGOXIN 125 MCG PO TABS
0.1250 mg | ORAL_TABLET | Freq: Every day | ORAL | Status: DC
  Administered 2022-09-06 – 2022-09-11 (×6): 0.125 mg via ORAL
  Filled 2022-09-05 (×6): qty 1

## 2022-09-05 MED ORDER — POLYETHYLENE GLYCOL 3350 17 G PO PACK: 17.0000 g | PACK | Freq: Every day | ORAL | Status: DC | PRN

## 2022-09-05 MED ORDER — IOHEXOL 300 MG/ML  SOLN
100.0000 mL | Freq: Once | INTRAMUSCULAR | Status: AC | PRN
  Administered 2022-09-05: 70 mL via INTRAVENOUS

## 2022-09-05 MED ORDER — DIPHENHYDRAMINE HCL 25 MG PO CAPS
25.0000 mg | ORAL_CAPSULE | Freq: Once | ORAL | Status: AC
  Administered 2022-09-05: 25 mg via ORAL
  Filled 2022-09-05: qty 1

## 2022-09-05 MED ORDER — DOBUTAMINE-DEXTROSE 4-5 MG/ML-% IV SOLN
INTRAVENOUS | Status: AC
  Administered 2022-09-05: 3 ug/kg/min via INTRAVENOUS
  Filled 2022-09-05: qty 250

## 2022-09-05 MED ORDER — ASPIRIN 81 MG PO TBEC
81.0000 mg | DELAYED_RELEASE_TABLET | Freq: Every day | ORAL | Status: DC
  Administered 2022-09-06 – 2022-09-11 (×6): 81 mg via ORAL
  Filled 2022-09-05 (×6): qty 1

## 2022-09-05 MED ORDER — PROMETHAZINE HCL 25 MG/ML IJ SOLN
INTRAMUSCULAR | Status: AC
  Filled 2022-09-05: qty 1

## 2022-09-05 MED ORDER — MOMETASONE FURO-FORMOTEROL FUM 100-5 MCG/ACT IN AERO
2.0000 | INHALATION_SPRAY | Freq: Two times a day (BID) | RESPIRATORY_TRACT | Status: DC
  Administered 2022-09-05 – 2022-09-11 (×8): 2 via RESPIRATORY_TRACT
  Filled 2022-09-05: qty 8.8

## 2022-09-05 MED ORDER — BUPROPION HCL ER (XL) 150 MG PO TB24
150.0000 mg | ORAL_TABLET | Freq: Every day | ORAL | Status: DC
  Administered 2022-09-06 – 2022-09-11 (×6): 150 mg via ORAL
  Filled 2022-09-05 (×6): qty 1

## 2022-09-05 MED ORDER — HYDROMORPHONE HCL 1 MG/ML IJ SOLN
0.5000 mg | Freq: Once | INTRAMUSCULAR | Status: AC
  Administered 2022-09-05: 0.5 mg via INTRAVENOUS
  Filled 2022-09-05 (×2): qty 1

## 2022-09-05 MED ORDER — SODIUM CHLORIDE 0.9 % IV BOLUS
500.0000 mL | Freq: Once | INTRAVENOUS | Status: AC
  Administered 2022-09-05: 500 mL via INTRAVENOUS

## 2022-09-05 MED ORDER — HEPARIN BOLUS VIA INFUSION
3800.0000 [IU] | Freq: Once | INTRAVENOUS | Status: AC
  Administered 2022-09-05: 3800 [IU] via INTRAVENOUS

## 2022-09-05 MED ORDER — SODIUM CHLORIDE 0.9 % IV SOLN
12.5000 mg | Freq: Once | INTRAVENOUS | Status: AC
  Administered 2022-09-05: 12.5 mg via INTRAVENOUS
  Filled 2022-09-05: qty 0.5

## 2022-09-05 MED ORDER — ONDANSETRON HCL 4 MG PO TABS: 4.0000 mg | ORAL_TABLET | Freq: Every day | ORAL | Status: DC | PRN

## 2022-09-05 MED ORDER — HEPARIN (PORCINE) 25000 UT/250ML-% IV SOLN
1150.0000 [IU]/h | INTRAVENOUS | Status: DC
  Administered 2022-09-05: 800 [IU]/h via INTRAVENOUS
  Administered 2022-09-07: 1150 [IU]/h via INTRAVENOUS
  Filled 2022-09-05 (×3): qty 250

## 2022-09-05 MED ORDER — PANTOPRAZOLE SODIUM 40 MG PO TBEC
40.0000 mg | DELAYED_RELEASE_TABLET | Freq: Two times a day (BID) | ORAL | Status: DC
  Administered 2022-09-05 – 2022-09-11 (×12): 40 mg via ORAL
  Filled 2022-09-05 (×12): qty 1

## 2022-09-05 MED ORDER — MONTELUKAST SODIUM 10 MG PO TABS
10.0000 mg | ORAL_TABLET | Freq: Every day | ORAL | Status: DC
  Administered 2022-09-05 – 2022-09-10 (×6): 10 mg via ORAL
  Filled 2022-09-05 (×6): qty 1

## 2022-09-05 MED ORDER — LEVOTHYROXINE SODIUM 100 MCG PO TABS
100.0000 ug | ORAL_TABLET | Freq: Every day | ORAL | Status: DC
  Administered 2022-09-06 – 2022-09-11 (×6): 100 ug via ORAL
  Filled 2022-09-05 (×6): qty 1

## 2022-09-05 MED ORDER — HEPARIN BOLUS VIA INFUSION
900.0000 [IU] | Freq: Once | INTRAVENOUS | Status: AC
  Administered 2022-09-05: 900 [IU] via INTRAVENOUS
  Filled 2022-09-05: qty 900

## 2022-09-05 MED ORDER — ASPIRIN 325 MG PO TABS
325.0000 mg | ORAL_TABLET | Freq: Every day | ORAL | Status: DC
  Administered 2022-09-05: 325 mg via ORAL
  Filled 2022-09-05: qty 1

## 2022-09-05 MED ORDER — ORAL CARE MOUTH RINSE: 15.0000 mL | OROMUCOSAL | Status: DC | PRN

## 2022-09-05 MED ORDER — INSULIN ASPART 100 UNIT/ML IJ SOLN: 0.0000 [IU] | INTRAMUSCULAR | Status: DC

## 2022-09-05 MED ORDER — DOBUTAMINE-DEXTROSE 4-5 MG/ML-% IV SOLN: 3.0000 ug/kg/min | INTRAVENOUS | Status: DC

## 2022-09-05 MED ORDER — PROMETHAZINE HCL 25 MG PO TABS: 25.0000 mg | ORAL_TABLET | Freq: Once | ORAL | Status: DC

## 2022-09-05 MED ORDER — SODIUM CHLORIDE 0.9 % IV BOLUS: 1000.0000 mL | Freq: Once | INTRAVENOUS | Status: DC

## 2022-09-05 NOTE — Consult Note (Signed)
Cardiology Consultation   Patient ID: SAFFIYAH BRACKER MRN: [id number]; DOB: [birth year]  Admit date: 09/05/2022 Date of Consult: 09/05/2022  PCP:  [name], NP   [facility] Providers Cardiologist:  Armanda Magic, MD        Patient Profile:   FALINA MALIZIA is a 52 y.o. female with a hx of  HFrEF (EF 45-50% on Dobuta 3), CAD status post coronary artery bypass and graft (LIMA to the LAD/ramus intermedius and RIMA to the obtuse marginal), DM Type I, prior DVT, anemia, anxiety, hypothyroidism, and hyperlipidemia who is being seen 09/05/2022 for the evaluation of NSTEMI iso c/f DKA.  History of Present Illness:   Ms. Osika is a 52 y.o. female with a hx of  HFrEF (EF 45-50% on Dobuta 3), CAD status post coronary artery bypass and graft (LIMA to the LAD/ramus intermedius and RIMA to the obtuse marginal), DM Type I complicated by gastroparesis, prior DVT, anemia, anxiety, hypothyroidism, and hyperlipidemia who is being seen 09/05/2022 for the evaluation of NSTEMI iso c/f DKA.  She presented today to [facility] in the setting of abdominal pain, nausea, vomiting and felt like she was going into DKA.  States she was compliant with her insulin.  Denied fever, chills, chest pain, or syncope.  She has not had a bowel movement for the last 5-7 days.  Fortunately it appears that she has a recurrent history of constipation from motility issues that eventually leads to nausea, emesis, decreased p.o. intake and hypovolemia triggering diabetic ketoacidosis.  In terms of her cardiac function, she is the best she is ever felt.  She is up moving and walking around with no limitations from chest pain or shortness of breath.  Repeat transthoracic echocardiogram in July notable for improved EF to 45 to 50% albeit on dobutamine 3.  Evaluation in the ED she was noted to have a leukocytosis (13) and thrombocytosis (430s) as well as acute kidney injury (creatinine 1.16) with an anion gap of  15 and elevated beta hydroxybutyrate at 1.48.  Initial lactate was 2.1 and improved to 1.2 with IV fluids.  Troponin [id number].  She was transferred over to [facility] for further evaluation.  Past Medical History:  Diagnosis Date   Allergy    Anemia    Anxiety    Asthma    CAD S/P percutaneous coronary angioplasty 03/28/2017   Cataract    CHF (congestive heart failure) (HCC)    Coronary artery disease    a. s/p PTCA DES x3 in the LAD (ostial DES placed, mid DES placed, distal DES placed) and PTCA/DES x1 to intermediate branch 02/2017. b. ACS 02/08/18 with LCx stent placement.   Depression    Diabetes mellitus    type 1   DKA (diabetic ketoacidoses) 12/31/2017   DKA (diabetic ketoacidosis) (HCC) 01/13/2022   Dyspnea    with exertion and dust, no oxygen   Elevated platelet count    GERD (gastroesophageal reflux disease)    Heart murmur    never caused any problems   High cholesterol    Hypothyroidism    Ischemic cardiomyopathy    a. EF low-normal with basal inferior akinesis, basal septal hypokinesis by echo 01/2018.   Myocardial infarction [facility]) 2019   Nausea and vomiting 12/04/2010   Non-ST elevation (NSTEMI) myocardial infarction [facility]) 02/08/2018   NSTEMI (non-ST elevated myocardial infarction) (HCC)    Substance abuse (HCC)    marijuana    Past Surgical History:  Procedure Laterality Date  25 GAUGE PARS PLANA VITRECTOMY WITH 20 GAUGE MVR PORT Right 11/24/2021   Procedure: 25 GAUGE PARS PLANA VITRECTOMY APPROACH RIGHT EYE, FLUID FLUID EXCHANGE;  Surgeon: Carmela Rima, MD;  Location: [facility] OR;  Service: Ophthalmology;  Laterality: Right;   AIR/FLUID EXCHANGE Right 12/27/2021   Procedure: AIR/FLUID EXCHANGE;  Surgeon: Carmela Rima, MD;  Location: [facility] OR;  Service: Ophthalmology;  Laterality: Right;   APPENDECTOMY     BIOPSY  09/29/2021   Procedure: BIOPSY;  Surgeon: Benancio Deeds, MD;  Location: [facility];  Service: Gastroenterology;;   CATARACT EXTRACTION W/PHACO  Right 11/24/2021   Procedure: CATARACT EXTRACTION AND INTRAOCULAR LENS PLACEMENT (IOC) RIGHT EYE;  Surgeon: Carmela Rima, MD;  Location: [facility] OR;  Service: Ophthalmology;  Laterality: Right;   COLONOSCOPY     COLONOSCOPY WITH PROPOFOL N/A 09/29/2021   Procedure: COLONOSCOPY WITH PROPOFOL;  Surgeon: Benancio Deeds, MD;  Location: [facility];  Service: Gastroenterology;  Laterality: N/A;   CORONARY ARTERY BYPASS GRAFT N/A 11/04/2018   Procedure: CORONARY ARTERY BYPASS GRAFTING (CABG), ON PUMP, TIMES THREE, USING LEFT AND RIGHT INTERNAL MAMMARY ARTERIES;  Surgeon: Linden Dolin, MD;  Location: [facility] OR;  Service: Open Heart Surgery;  Laterality: N/A;   CORONARY STENT INTERVENTION N/A 03/20/2017   Procedure: DES x 3 LAD, DES OM; Surgeon: Kathleene Hazel, MD;  Location: [facility] INVASIVE CV LAB;  Service: Cardiovascular;  Laterality: N/A;   CORONARY/GRAFT ACUTE MI REVASCULARIZATION N/A 02/08/2018   Procedure: Coronary/Graft Acute MI Revascularization;  Surgeon: Lyn Records, MD;  Location: [facility] INVASIVE CV LAB;  Service: Cardiovascular;  Laterality: N/A;   ESOPHAGOGASTRODUODENOSCOPY (EGD) WITH PROPOFOL N/A 09/29/2021   Procedure: ESOPHAGOGASTRODUODENOSCOPY (EGD) WITH PROPOFOL;  Surgeon: Benancio Deeds, MD;  Location: [facility];  Service: Gastroenterology;  Laterality: N/A;   GAS INSERTION Right 04/28/2021   Procedure: INSERTION OF GAS ( C3F8);  Surgeon: Carmela Rima, MD;  Location: [facility] OR;  Service: Ophthalmology;  Laterality: Right;  Right eye   GAS/FLUID EXCHANGE Right 04/28/2021   Procedure: GAS/FLUID EXCHANGE;  Surgeon: Carmela Rima, MD;  Location: [facility] OR;  Service: Ophthalmology;  Laterality: Right;  Right eye   IR FLUORO GUIDE CV LINE RIGHT  05/29/2022   IR US GUIDE VASC ACCESS RIGHT  05/30/2022   LASER PHOTO ABLATION Right 11/24/2021   Procedure: ENDO LASER PAN PHOTOCOAGULATION;  Surgeon: Carmela Rima, MD;  Location: [facility] OR;  Service: Ophthalmology;  Laterality: Right;   LASER  PHOTO ABLATION Right 12/27/2021   Procedure: ENDO LASER;  Surgeon: Carmela Rima, MD;  Location: [facility] OR;  Service: Ophthalmology;  Laterality: Right;   LEFT HEART CATH AND CORONARY ANGIOGRAPHY N/A 03/20/2017   Procedure: LEFT HEART CATH AND CORONARY ANGIOGRAPHY;  Surgeon: Kathleene Hazel, MD;  Location: [facility];  Service: Cardiovascular;  Laterality: N/A;   LEFT HEART CATH AND CORONARY ANGIOGRAPHY N/A 02/08/2018   Procedure: LEFT HEART CATH AND CORONARY ANGIOGRAPHY;  Surgeon: Lyn Records, MD;  Location: [facility];  Service: Cardiovascular;  Laterality: N/A;   LEFT HEART CATH AND CORONARY ANGIOGRAPHY N/A 10/29/2018   Procedure: LEFT HEART CATH AND CORONARY ANGIOGRAPHY;  Surgeon: Kathleene Hazel, MD;  Location: [facility] LAB;  Service: Cardiovascular;  Laterality: N/A;   LEFT HEART CATH AND CORS/GRAFTS ANGIOGRAPHY N/A 10/04/2020   Procedure: LEFT HEART CATH AND CORS/GRAFTS ANGIOGRAPHY;  Surgeon: Tonny Bollman, MD;  Location: [facility] INVASIVE CV LAB;  Service: Cardiovascular;  Laterality: N/A;   LEFT HEART CATH AND CORS/GRAFTS ANGIOGRAPHY N/A 05/11/2021  Procedure: LEFT HEART CATH AND CORS/GRAFTS ANGIOGRAPHY;  Surgeon: Swaziland, Peter M, MD;  Location: [facility] INVASIVE CV LAB;  Service: Cardiovascular;  Laterality: N/A;   PARS PLANA VITRECTOMY Right 04/28/2021   Procedure: PARS PLANA VITRECTOMY WITH 25 GAUGE REMOVAL OF TRATIONAL MEMBRANE RIGHT EYE; DRAINAGE OF SUBRETINAL FLUID RIGHT EYE;  Surgeon: Carmela Rima, MD;  Location: [facility] OR;  Service: Ophthalmology;  Laterality: Right;  Right eye   PARS PLANA VITRECTOMY 27 GAUGE Right 12/27/2021   Procedure: PARS PLANA VITRECTOMY 27 GAUGE;  Surgeon: Carmela Rima, MD;  Location: [facility] OR;  Service: Ophthalmology;  Laterality: Right;   PHOTOCOAGULATION WITH LASER Right 04/28/2021   Procedure: PHOTOCOAGULATION WITH LASER;  Surgeon: Carmela Rima, MD;  Location: [facility] OR;  Service: Ophthalmology;  Laterality: Right;  Right eye    POLYPECTOMY  09/29/2021   Procedure: POLYPECTOMY;  Surgeon: Benancio Deeds, MD;  Location: [facility];  Service: Gastroenterology;;   RIGHT/LEFT HEART CATH AND CORONARY/GRAFT ANGIOGRAPHY N/A 05/23/2022   Procedure: RIGHT/LEFT HEART CATH AND CORONARY/GRAFT ANGIOGRAPHY;  Surgeon: Kathleene Hazel, MD;  Location: [facility] INVASIVE CV LAB;  Service: Cardiovascular;  Laterality: N/A;   TEE WITHOUT CARDIOVERSION N/A 11/04/2018   Procedure: TRANSESOPHAGEAL ECHOCARDIOGRAM (TEE);  Surgeon: Linden Dolin, MD;  Location: [facility] OR;  Service: Open Heart Surgery;  Laterality: N/A;     Home Medications:  Prior to Admission medications   Medication Sig Start Date End Date Taking? Authorizing Provider  albuterol (VENTOLIN HFA) 108 (90 Base) MCG/ACT inhaler INHALE 2 PUFFS INTO THE LUNGS EVERY 6 (SIX) HOURS AS NEEDED FOR WHEEZING OR SHORTNESS OF BREATH. 10/25/21   Newlin, Odette Horns, MD  Alirocumab (PRALUENT) 150 MG/ML SOAJ Inject 1 pen  into the skin every 14 (fourteen) days. 09/23/21   Quintella Reichert, MD  apixaban (ELIQUIS) 5 MG TABS tablet Take 1 tablet (5 mg total) by mouth 2 (two) times daily. 06/27/22   Bensimhon, Bevelyn Buckles, MD  aspirin 81 MG EC tablet Take 1 tablet (81 mg total) by mouth daily. Patient taking differently: Take 81 mg by mouth at bedtime. 12/07/17   Amin, Loura Halt, MD  buPROPion (WELLBUTRIN XL) 150 MG 24 hr tablet Take 1 tablet (150 mg total) by mouth every morning. 08/10/22 10/09/22  Bahraini, Sarah A  Continuous Glucose Sensor (DEXCOM G6 SENSOR) MISC Use as instructed to check glucose levels. Change every 10 days. 08/28/22   Carlus Pavlov, MD  Continuous Glucose Transmitter (DEXCOM G6 TRANSMITTER) MISC Use as instructed to check glucose levels. Change every 90 days. 05/29/22   Carlus Pavlov, MD  digoxin (LANOXIN) 0.125 MG tablet Take 1 tablet (0.125 mg total) by mouth daily. 05/31/22   Clegg, Amy D, NP  diphenhydrAMINE (BENADRYL) 25 MG tablet Take 37.5 mg by mouth at bedtime.    [provider]  DOBUTamine (DOBUTREX) 4-5 MG/ML-% infusion Inject 191.4 mcg/min into the vein continuous. Per [facility] home infusion 05/31/22   Clegg, Amy D, NP  DULoxetine (CYMBALTA) 30 MG capsule Take 1 capsule (30 mg total) by mouth at bedtime. 08/10/22 10/09/22  Bahraini, Sarah A  fluticasone (FLONASE) 50 MCG/ACT nasal spray Insert 1 spray each nostril daily for 3 days then use 2-3 times a week as needed Patient taking differently: Place 1 spray into both nostrils daily as needed for allergies. 05/16/22   [name], NP  Glucagon 3 MG/DOSE POWD Place 3 mg into the nose once as needed for up to 1 dose. 07/01/21   Carlus Pavlov, MD  Insulin Disposable Pump (OMNIPOD  5 G6 INTRO, GEN 5,) KIT Use as directed. 05/03/22   Carlus Pavlov, MD  Insulin Disposable Pump (OMNIPOD 5 G6 PODS, GEN 5,) MISC Use every 3 (three) days. 05/03/22   Carlus Pavlov, MD  insulin lispro (HUMALOG) 100 UNIT/ML injection Inject 0.35 mLs (35 Units total) into the skin daily via pump. Patient taking differently: Inject 35 Units into the skin See admin instructions. 35 units daily via insulin pump 03/08/22   Carlus Pavlov, MD  levothyroxine (SYNTHROID) 100 MCG tablet Take 1 tablet (100 mcg total) by mouth daily before breakfast. 08/23/22   [name], NP  losartan (COZAAR) 25 MG tablet Take 1/2 tablet (12.5 mg total) by mouth daily. 05/31/22   Clegg, Amy D, NP  meloxicam (MOBIC) 15 MG tablet Take 15 mg by mouth daily as needed for pain.    [provider]  meloxicam (MOBIC) 15 MG tablet Take 1 tablet (15 mg total) by mouth daily. 08/30/22   Gomez Cleverly, MD  metoCLOPramide (REGLAN) 5 MG tablet Take 1 tablet (5 mg total) by mouth every 8 (eight) hours as needed for nausea or vomiting. 12/23/21 12/23/22  Armbruster, Willaim Rayas, MD  mometasone-formoterol (DULERA) 100-5 MCG/ACT AERO Inhale 2 puffs into the lungs 2 (two) times daily. 02/15/22   [name], NP  montelukast (SINGULAIR) 10 MG tablet Take 1 tablet (10  mg total) by mouth at bedtime. 08/23/22   [name], NP  Multiple Vitamin (MULTIVITAMIN WITH MINERALS) TABS tablet Take 1 tablet by mouth daily with lunch.    [provider]  nitroGLYCERIN (NITROSTAT) 0.4 MG SL tablet Place 1 tablet (0.4 mg total) under the tongue every 5 (five) minutes as needed for chest pain. 06/08/22   Bensimhon, Bevelyn Buckles, MD  ondansetron (ZOFRAN) 4 MG tablet Take 1 tablet (4 mg total) by mouth daily as needed for nausea or vomiting. 03/24/22 03/24/23  Hollice Espy, MD  pantoprazole (PROTONIX) 40 MG tablet Take 1 tablet (40 mg total) by mouth 2 (two) times daily. 08/23/22   [name], NP  spironolactone (ALDACTONE) 25 MG tablet Take 25 mg by mouth daily.    [provider]    Inpatient Medications: Scheduled Meds:  [START ON 09/06/2022] aspirin EC  81 mg Oral Daily   [START ON 09/06/2022] buPROPion  150 mg Oral Daily   [START ON 09/06/2022] Chlorhexidine Gluconate Cloth  6 each Topical Daily   [START ON 09/06/2022] digoxin  0.125 mg Oral Daily   [START ON 09/06/2022] diphenhydrAMINE  25 mg Oral Once   [START ON 09/06/2022] insulin aspart  0-15 Units Subcutaneous Q4H   [START ON 09/06/2022] levothyroxine  100 mcg Oral QAC breakfast   mometasone-formoterol  2 puff Inhalation BID   montelukast  10 mg Oral QHS   pantoprazole  40 mg Oral BID   promethazine       Continuous Infusions:  DOBUTamine 3 mcg/kg/min (09/05/22 2300)   heparin 900 Units/hr (09/05/22 2300)   PRN Meds: metoCLOPramide, ondansetron, mouth rinse, polyethylene glycol, promethazine  Allergies:    Allergies  Allergen Reactions   Crestor [Rosuvastatin Calcium] Other (See Comments)    Myalgias  Arthralgias    Lipitor [Atorvastatin] Other (See Comments)    Myalgias    Monosodium Glutamate Nausea And Vomiting and Other (See Comments)    MSG - migraine   Zetia [Ezetimibe] Other (See Comments)    Myalgias    Ciprofloxin Hcl [Ciprofloxacin] Nausea And Vomiting and Other (See  Comments)  Severe migraine   Food Color Red [Red Dye #40 (Allura Red)] Diarrhea    Social History:   Social History   Socioeconomic History   Marital status: Divorced    Spouse name: Not on file   Number of children: 0   Years of education: Not on file   Highest education level: Doctorate  Occupational History   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 3 times weekly; reports decreasing use   Sexual activity: Not Currently    Birth control/protection: None  Other Topics Concern   Not on file  Social History Narrative   Not on file   Social Determinants of Health   Financial Resource Strain: High Risk (05/15/2022)   Overall Financial Resource Strain (CARDIA)    Difficulty of Paying Living Expenses: Very hard  Food Insecurity: No Food Insecurity (05/26/2022)   Hunger Vital Sign    Worried About Running Out of Food in the Last Year: Never true    Ran Out of Food in the Last Year: Never true  Recent Concern: Food Insecurity - Food Insecurity Present (05/15/2022)   Hunger Vital Sign    Worried About Running Out of Food in the Last Year: Often true    Ran Out of Food in the Last Year: Often true  Transportation Needs: No Transportation Needs (05/26/2022)   PRAPARE - Administrator, Civil Service (Medical): No    Lack of Transportation (Non-Medical): No  Recent Concern: Transportation Needs - Unmet Transportation Needs (05/15/2022)   PRAPARE - Transportation    Lack of Transportation (Medical): Yes    Lack of Transportation (Non-Medical): Yes  Physical  Activity: Unknown (05/15/2022)   Exercise Vital Sign    Days of Exercise per Week: 0 days    Minutes of Exercise per Session: Not on file  Stress: Stress Concern Present (05/15/2022)   [employer] of Occupational Health - Occupational Stress Questionnaire    Feeling of Stress : Very much  Social Connections: Socially Isolated (05/15/2022)   Social Connection and Isolation Panel [NHANES]    Frequency of Communication with Friends and Family: More than three times a week    Frequency of Social Gatherings with Friends and Family: Once a week    Attends Religious Services: Never    Database administrator or Organizations: No    Attends Engineer, structural: Not on file    Marital Status: Divorced  Intimate Partner Violence: Not At Risk (05/26/2022)   Humiliation, Afraid, Rape, and Kick questionnaire    Fear of Current or Ex-Partner: No    Emotionally Abused: No    Physically Abused: No    Sexually Abused: No    Family History:    Family History  Problem Relation Age of Onset   Cancer Mother        T cell lymphoma   Hypertension Mother    Hypercalcemia Mother    OCD Father    Anxiety disorder Father  Coronary artery disease Father    Congestive Heart Failure Father    Asthma Father    Hypercalcemia Father    Hypertension Father    Colon polyps Father    Depression Maternal Grandmother    Diabetes Paternal Grandmother    Stomach cancer Neg Hx    Esophageal cancer Neg Hx    Colon cancer Neg Hx    Rectal cancer Neg Hx      ROS:  Please see the history of present illness.   All other ROS reviewed and negative.     Physical Exam/Data:   Vitals:   09/05/22 2230 09/05/22 2245 09/05/22 2300 09/05/22 2315  BP: (!) 88/61 (!) 88/58 112/67 (!) 84/56  Pulse: 98 95 (!) 113 (!) 102  Resp: 11 14 20 18   Temp:      TempSrc:      SpO2: 99% 99% 100% 98%  Weight:      Height:        Intake/Output Summary (Last 24 hours) at 09/05/2022 2329 Last data filed at  09/05/2022 2300 Gross per 24 hour  Intake 119.46 ml  Output --  Net 119.46 ml      09/05/2022   10:15 PM 09/05/2022    2:00 PM 08/23/2022   10:02 AM  Last 3 Weights  Weight (lbs) 135 lb 12.9 oz 141 lb 12.1 oz 141 lb 12.8 oz  Weight (kg) 61.6 kg 64.3 kg 64.32 kg     Body mass index is 22.6 kg/m.  General:  Well nourished, well developed, in no acute distress HEENT: normal Neck: no JVD Vascular: No carotid bruits; Distal pulses 2+ bilaterally Cardiac:  normal S1, S2; RRR; systolic murmur at the apex Lungs:  clear to auscultation bilaterally, no wheezing, rhonchi or rales  Abd: soft, nontender, no hepatomegaly  Ext: no edema Musculoskeletal:  No deformities, BUE and BLE strength normal and equal Skin: warm and dry  Neuro:  CNs 2-12 intact, no focal abnormalities noted Psych:  Normal affect   EKG:  The EKG was personally reviewed and demonstrates: Sinus tachycardia with biatrial enlargement Telemetry:  Telemetry was personally reviewed and demonstrates: Sinus tachycardia  Relevant CV Studies:  TTE 08/17/22   1. Left ventricular ejection fraction, by estimation, is 45 to 50%. The  left ventricle has mildly decreased function. The anteroseptal walls and  apex appear hypokinetic. Left ventricular diastolic parameters are  indeterminant.   2. Right ventricular systolic function is normal. The right ventricular  size is normal. There is normal pulmonary artery systolic pressure. The  estimated right ventricular systolic pressure is 29.2 mmHg.   3. The mitral valve is normal in structure. Trivial mitral valve  regurgitation.   4. The aortic valve is tricuspid. Aortic valve regurgitation is not  visualized. No aortic stenosis is present.   5. The inferior vena cava is normal in size with greater than 50%  respiratory variability, suggesting right atrial pressure of 3 mmHg.   Comparison(s): Compared to prior TTE on 04/2022, the LVEF has improved  significantly from 20-25% to 45-50%  with improved wall motion.   Limited echo 04/24: EF 20-25%, akinesis anteroseptal wall and apex, RV severely reduced Echo 4/23: Echo EF 30-35% Echo (12/27/20)  EF 40-45% RV mildly HK Personally reviewed Echo 09/2020 EF 20-25%  Echo 11/2018 EF 55-60%    R/LHC 04/24: Severe double vessel CAD Chronic occlusion of ostial LAD stent. The proximal, mid and distal LAD and Diagonal branches fill from the LIMA graft. The ostial Circumflex  stent has severe restenosis, unchanged from last cath. Patent RIMA graft that fills the Circumflex Large dominant RCA with mild non-obstructive disease.    Cath 09/2020   Mid LAD lesion is 60% stenosed.   Ost Cx to Prox Cx lesion is 99% stenosed.   Prox Cx to Mid Cx lesion is 70% stenosed.   Ramus-1 lesion is 60% stenosed.   Ramus-2 lesion is 20% stenosed.   Ramus-3 lesion is 70% stenosed.   Prox LAD to Mid LAD lesion is 100% stenosed.   Dist LAD-1 lesion is 70% stenosed.   Dist LAD-2 lesion is 70% stenosed.   Non-stenotic Mid LAD to Dist LAD lesion was previously treated.   RIMA graft was visualized by angiography and is small.   LIMA and is large.   The graft exhibits no disease.   LV end diastolic pressure is normal.  Severe native vessel CAD with total occlusion of the proximal LAD, moderate stenosis of the intermediate branch, and severe ostial circumflex in-stent restenosis S/P CABG with patency of the both the RIMA-circumflex and LAD sequential to the intermediate   Laboratory Data:  High Sensitivity Troponin:   Recent Labs  Lab 09/05/22 1221 09/05/22 1422  TROPONINIHS 1,217* 1,216*     Chemistry Recent Labs  Lab 08/31/22 1256 09/05/22 1221 09/05/22 1233  NA 137 134* 136  K 4.0 3.7 3.7  CL 104 98  --   CO2 21* 21*  --   GLUCOSE 148* 173*  --   BUN 14 23*  --   CREATININE 0.98 1.16*  --   CALCIUM 8.9 10.6*  --   MG 2.0  --   --   GFRNONAA >60 56*  --   ANIONGAP 12 15  --     Recent Labs  Lab 08/31/22 1256 09/05/22 1221  PROT  6.7 8.8*  ALBUMIN 3.6 4.9  AST 24 33  ALT 21 25  ALKPHOS 76 105  BILITOT <0.1* 0.7   Lipids No results for input(s): "CHOL", "TRIG", "HDL", "LABVLDL", "LDLCALC", "CHOLHDL" in the last 168 hours.  Hematology Recent Labs  Lab 08/31/22 1256 09/05/22 1221 09/05/22 1233  WBC 4.8 13.4*  --   RBC 3.64* 4.28  --   HGB 10.9* 12.9 13.9  HCT 33.3* 37.3 41.0  MCV 91.5 87.1  --   MCH 29.9 30.1  --   MCHC 32.7 34.6  --   RDW 15.3 15.9*  --   PLT 361 433*  --    Thyroid No results for input(s): "TSH", "FREET4" in the last 168 hours.  BNPNo results for input(s): "BNP", "PROBNP" in the last 168 hours.  DDimer No results for input(s): "DDIMER" in the last 168 hours.  Radiology/Studies:  CT ABDOMEN PELVIS W CONTRAST  Result Date: 09/05/2022 CLINICAL DATA:  Constipation, vomiting EXAM: CT ABDOMEN AND PELVIS WITH CONTRAST TECHNIQUE: Multidetector CT imaging of the abdomen and pelvis was performed using the standard protocol following bolus administration of intravenous contrast. RADIATION DOSE REDUCTION: This exam was performed according to the departmental dose-optimization program which includes automated exposure control, adjustment of the mA and/or kV according to patient size and/or use of iterative reconstruction technique. CONTRAST:  70mL OMNIPAQUE IOHEXOL 300 MG/ML  SOLN COMPARISON:  05/20/2022 FINDINGS: Lower chest: Small linear densities are seen in left lower lung field, possibly scarring. Hepatobiliary: No focal abnormalities are seen in liver. Gallbladder is unremarkable. There is no dilation of bile ducts. Pancreas: No focal abnormalities are seen. Spleen: Unremarkable. Adrenals/Urinary Tract: Adrenals are unremarkable.  There is no hydronephrosis. There are no renal or ureteral stones. There is a 3 mm low-density in the lower pole of right kidney, possibly a cyst. Urinary bladder is unremarkable. Stomach/Bowel: Small hiatal hernia is seen. Small bowel loops are not dilated. Appendix is not  seen. There is no pericecal inflammation. There is no significant wall thickening in colon. There is no pericolic stranding. Vascular/Lymphatic: Scattered arterial calcifications are seen in aorta and its major branches. Reproductive: Unremarkable. Other: There is no ascites or pneumoperitoneum. Musculoskeletal: Degenerative changes are noted in lumbar spine, most severe at L3-L4 and L4-L5 levels with spinal stenosis and encroachment of neural foramina. No interval changes are noted. IMPRESSION: No acute findings are seen in CT scan of abdomen and pelvis. There is no evidence of intestinal obstruction or pneumoperitoneum. There is no hydronephrosis. Small hiatal hernia. Tiny right renal cyst. Aortic arteriosclerosis. Lumbar spondylosis. Electronically Signed   By: Ernie Avena M.D.   On: 09/05/2022 15:30   DG Chest Port 1 View  Result Date: 09/05/2022 CLINICAL DATA:  Chest discomfort EXAM: PORTABLE CHEST 1 VIEW COMPARISON:  CXR 06/27/22 FINDINGS: No pleural effusion. No pneumothorax. Status post median sternotomy. No radiographically apparent displaced rib fractures. Visualized upper abdomen is unremarkable. No focal airspace opacity. Right-sided central venous catheter with the tip in the lower SVC. Degenerative changes of the bilateral glenohumeral joints. IMPRESSION: No focal airspace opacity Electronically Signed   By: Lorenza Cambridge M.D.   On: 09/05/2022 13:30     Assessment and Plan:   Hypovolemia secondary to GI motility issues complicated by low-level ketoacidosis Presented with nausea and emesis in the setting of constipation for 5 to 7 days with workup notable for mild lactic acid elevation, acute kidney injury, and elevated beta hydroxybutyrate all consistent with hypovolemia and mild ketoacidosis.  Her symptoms have improved since IV fluids and her lactate has returned to normal.  She has no evidence of cardiogenic component to her presentation at this time.  She has follow-up with GI for  her motility issues which will be paramount. - Management of diabetes per primary - s/p IVF  2.  Troponin elevation secondary to demand ischemia Without chest pain with clear stressor of hypovolemic shock.  Her past presentations with NSTEMI's have demonstrated patency of both RIMA and LIMA vessels.  Given the absence of systems and her troponin elevation in the setting of a stressor, not clear that repeat left heart catheterization is indicated at this time.  Would continue medical management. -Continue heparin for now -Patient listed for aspirin and Eliquis in home meds, would clarify need for both with acute heart failure team in the morning -Statin intolerant, has Praluent at home  3.  Heart failure with reduced ejection fraction now improved (EF 45%) on chronic inotropic therapy Evidence of acute heart failure on exam or history.  In fact this may be a reasonable time to consider weaning of dobutamine but defer this to advanced heart failure.  Continue dobutamine for now. -Continue dobutamine 3 -Continue digoxin -hold Losartan given AKI  Risk Assessment/Risk Scores:     TIMI Risk Score for Unstable Angina or Non-ST Elevation MI:   The patient's TIMI risk score is 4, which indicates a 20% risk of all cause mortality, new or recurrent myocardial infarction or need for urgent revascularization in the next 14 days.  New York Heart Association (NYHA) Functional Class NYHA Class I        For questions or updates, please contact [facility] Please  consult [email or web address] for contact info under    Signed, Thana Ates, MD  09/05/2022 11:29 PM

## 2022-09-05 NOTE — Progress Notes (Signed)
Pt states she is nauseated and in pain. She is moving a lot due to not feeling good. Pt was hard to get a SAT on. RT placed a heating pad on left hand and placed 2 warm blanket on the Pt. SATS 100%. HR 140's. Her BBS clear

## 2022-09-05 NOTE — ED Notes (Signed)
EDP notified that pt HR remains elevated

## 2022-09-05 NOTE — ED Notes (Signed)
Pt requested blood sugar check, CBG 199

## 2022-09-05 NOTE — ED Triage Notes (Signed)
Pt complains of constipation x 7 days. Yesterday had a BM and then drank hot tea and started vomiting yesterday. Pt states diabetic and CHF start to act up every time vomits. Pt states chest aches but doesn't not describe as a pain.

## 2022-09-05 NOTE — H&P (Addendum)
NAME:  Jennifer Hogan, MRN:  [id number], DOB:  [birth year], LOS: 0 ADMISSION DATE:  09/05/2022, CONSULTATION DATE:  09/05/22 REFERRING MD:  [facility] CHIEF COMPLAINT:  Weakness, N/V   History of Present Illness:  Jennifer Hogan is a 52 y.o. 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 She is followed by Dr. Gala Romney as an outpatient and is on chronic Dobutamine at 3 mcg/kg/min via PICC (has been on this since April 2024).  She presented to [facility] 8/13 with generalized weakness, constipation, nausea. She felt as if she had DKA as this was a similar presentation/symptoms to a prior episode of DKA for which she required admission. Glucose in ED 167, CO2 21, normal anion gap. Beta hydroxybutyric acid mildly up at 1.48. Lactate normal.  She was found to have NSTEMI with troponins 1216. She was started on Heparin infusion and was transferred to [facility] for ICU admission and further workup/management.  Upon arrival to [facility], she subjectively feels much improved. Currently nausea free. SBP around 90-100 which she states is baseline. She is on DBA 3 (baseline dose) and is chest pain free. Feels tired, hoping to get som rest tonight.  Pertinent  Medical History:  has Depression; Hypothyroidism; Vitiligo; Type 1 diabetes mellitus (HCC); Allergic state; Anemia; NSTEMI (non-ST elevated myocardial infarction) (HCC); Pure hypercholesterolemia; Gastroparesis due to DM (HCC); Ischemic cardiomyopathy; Gastroparesis; Marijuana abuse; Non-ST elevation (NSTEMI) myocardial infarction [facility]); S/P drug eluting coronary stent placement; Hyperglycemia due to type 1 diabetes mellitus (HCC); Hyperlipidemia LDL goal <70; Moderate episode of recurrent major depressive disorder (HCC); Acute on chronic systolic heart  failure (HCC); Cardiomyopathy (HCC); Altered bowel habits; Acute systolic heart failure (HCC); DCM (dilated cardiomyopathy) (HCC); Takotsubo cardiomyopathy; Acute hypoxic respiratory failure (HCC); MDD (major depressive disorder), recurrent episode, severe (HCC); GAD (generalized anxiety disorder); Vitreous hemorrhage, right (HCC); Coronary artery disease due to lipid rich plaque; Proctitis; S/P CABG x 3; Hiatal hernia; Cellulitis of auricle of left ear; Postoperative seroma of ear after procedure on ear; DKA, type 1 (HCC); DKA (diabetic ketoacidosis) (HCC); Malnutrition of moderate degree; Uncontrolled diabetes mellitus with hyperglycemia (HCC); and DVT (deep venous thrombosis) (HCC) on their problem list.  Significant Hospital Events: Including procedures, antibiotic start and stop dates in addition to other pertinent events   8/13 admit.  Interim History / Subjective:  Comfortable. Denies chest pain. Feels tired, hoping to get some rest tonight.  Objective:  Blood pressure (!) 87/59, pulse (!) 114, temperature 98.7 F (37.1 C), temperature source Oral, resp. rate 16, height 5\' 2"  (1.575 m), weight 64.3 kg, last menstrual period 06/06/2022, SpO2 100%.       No intake or output data in the 24 hours ending 09/05/22 2232 Filed Weights   09/05/22 1400  Weight: 64.3 kg    Examination: General: Adult female, resting in bed, in NAD. Neuro: A&O x 3, no deficits. HEENT: Miranda/AT. Sclerae anicteric. EOMI. Cardiovascular: RRR, no M/R/G.  Lungs: Respirations even and unlabored.  CTA bilaterally, No W/R/R. Abdomen: BS x 4, soft, NT/ND.  Musculoskeletal: No gross deformities, no edema.  Skin: Intact, warm, no rashes.  Labs/imaging personally reviewed:  CT A/P 8/13 > neg.  Assessment & Plan:   NSTEMI. - Continue Heparin infusion. - Cards aware, will see tonight.  AoC sCHF/end stage  CHF with severe ICM and no revascularization options - on Dobutamine 3 chronically and followed by Dr. Gala Romney  as outpatient. Last echo from 08/17/22 with improved EF to 45-50% (previously 20-25% in April). Hx CAD, NSTEMI, HLD, chronic hypotension (SBP 90s per pt). - CHF team consult in AM. - Continue PTA Dobutamine, Digoxin (of note, serum Dig slightly low at 0.5. Defer titration to cards/CHF team), ASA. - Accept SBP 90. - Hold PTA Losartan (consider d/c? From [facility]), Spironolactone.  Hx DM. - Hold insulin pump. - SSI.  AKI. - Supportive care. - Follow BMP.  Nausea. - Continue PTA Zofran.  Hx LIJ DVT on Eliquis. - Hold PTA Eliquis as currently on full dose Heparin infusion.  Hx Hypothyroidism. - Continue PTA Synthroid.   Discussed with cardiology on call, they will see pt tonight. Likely appropriate for cardiology to assume care as primary with PCCM off 8/14, day team to discuss with cards/CHF team.  Best practice (evaluated daily):  Diet/type: Regular consistency (see orders) DVT prophylaxis: systemic heparin GI prophylaxis: N/A Lines: Central line (PICC) Foley:  N/A Code Status:  full code Last date of multidisciplinary goals of care discussion: None yet.  Labs   CBC: Recent Labs  Lab 08/31/22 1256 09/05/22 1221 09/05/22 1233  WBC 4.8 13.4*  --   HGB 10.9* 12.9 13.9  HCT 33.3* 37.3 41.0  MCV 91.5 87.1  --   PLT 361 433*  --     Basic Metabolic Panel: Recent Labs  Lab 08/31/22 1256 09/05/22 1221 09/05/22 1233  NA 137 134* 136  K 4.0 3.7 3.7  CL 104 98  --   CO2 21* 21*  --   GLUCOSE 148* 173*  --   BUN 14 23*  --   CREATININE 0.98 1.16*  --   CALCIUM 8.9 10.6*  --   MG 2.0  --   --    GFR: Estimated Creatinine Clearance: 49.4 mL/min (A) (by C-G formula based on SCr of 1.16 mg/dL (H)). Recent Labs  Lab 08/31/22 1256 09/05/22 1221 09/05/22 1231 09/05/22 1426  WBC 4.8 13.4*  --   --   LATICACIDVEN  --   --  2.1* 1.2    Liver Function Tests: Recent Labs  Lab 08/31/22 1256 09/05/22 1221  AST 24 33  ALT 21 25  ALKPHOS 76 105  BILITOT <0.1* 0.7   PROT 6.7 8.8*  ALBUMIN 3.6 4.9   Recent Labs  Lab 09/05/22 1221  LIPASE <10*   No results for input(s): "AMMONIA" in the last 168 hours.  ABG    Component Value Date/Time   PHART 7.455 (H) 05/23/2022 1133   PCO2ART 29.4 (L) 05/23/2022 1133   PO2ART 87 05/23/2022 1133   HCO3 20.6 09/05/2022 1233   TCO2 21 (L) 09/05/2022 1233   ACIDBASEDEF 2.0 09/05/2022 1233   O2SAT 72 09/05/2022 1233     Coagulation Profile: No results for input(s): "INR", "PROTIME" in the last 168 hours.  Cardiac Enzymes: No results for input(s): "CKTOTAL", "CKMB", "CKMBINDEX", "TROPONINI" in the last 168 hours.  HbA1C: HbA1c, POC (controlled diabetic range)  Date/Time Value Ref Range Status  08/23/2022 10:12 AM 7.2 (A) 0.0 - 7.0 % Final   Hgb A1c MFr Bld  Date/Time Value Ref Range Status  05/24/2022 12:18 PM 7.8 (H) 4.8 - 5.6 % Final    Comment:    (NOTE) Pre diabetes:          5.7%-6.4%  Diabetes:              >  6.4%  Glycemic control for   <7.0% adults with diabetes   03/24/2022 04:46 AM 8.0 (H) 4.8 - 5.6 % Final    Comment:    (NOTE)         Prediabetes: 5.7 - 6.4         Diabetes: >6.4         Glycemic control for adults with diabetes: <7.0     CBG: Recent Labs  Lab 09/05/22 1159 09/05/22 1711 09/05/22 2106  GLUCAP 167* 199* 178*    Review of Systems:   All negative; except for those that are bolded, which indicate positives.  Constitutional: weight loss, weight gain, night sweats, fevers, chills, fatigue, weakness.  HEENT: headaches, sore throat, sneezing, nasal congestion, post nasal drip, difficulty swallowing, tooth/dental problems, visual complaints, visual changes, ear aches. Neuro: difficulty with speech, weakness, numbness, ataxia. CV:  chest pain, orthopnea, PND, swelling in lower extremities, dizziness, palpitations, syncope.  Resp: cough, hemoptysis, dyspnea, wheezing. GI: heartburn, indigestion, abdominal pain, nausea, vomiting, diarrhea, constipation, change  in bowel habits, loss of appetite, hematemesis, melena, hematochezia.  GU: dysuria, change in color of urine, urgency or frequency, flank pain, hematuria. MSK: joint pain or swelling, decreased range of motion. Psych: change in mood or affect, depression, anxiety, suicidal ideations, homicidal ideations. Skin: rash, itching, bruising.   Past Medical History:  She,  has a past medical history of Allergy, Anemia, Anxiety, Asthma, CAD S/P percutaneous coronary angioplasty (03/28/2017), Cataract, CHF (congestive heart failure) (HCC), Coronary artery disease, Depression, Diabetes mellitus, DKA (diabetic ketoacidoses) (12/31/2017), DKA (diabetic ketoacidosis) (HCC) (01/13/2022), Dyspnea, Elevated platelet count, GERD (gastroesophageal reflux disease), Heart murmur, High cholesterol, Hypothyroidism, Ischemic cardiomyopathy, Myocardial infarction (HCC) (2019), Nausea and vomiting (12/04/2010), Non-ST elevation (NSTEMI) myocardial infarction [facility]) (02/08/2018), NSTEMI (non-ST elevated myocardial infarction) (HCC), and Substance abuse (HCC).   Surgical History:   Past Surgical History:  Procedure Laterality Date   25 GAUGE PARS PLANA VITRECTOMY WITH 20 GAUGE MVR PORT Right 11/24/2021   Procedure: 25 GAUGE PARS PLANA VITRECTOMY APPROACH RIGHT EYE, FLUID FLUID EXCHANGE;  Surgeon: Carmela Rima, MD;  Location: [facility] OR;  Service: Ophthalmology;  Laterality: Right;   AIR/FLUID EXCHANGE Right 12/27/2021   Procedure: AIR/FLUID EXCHANGE;  Surgeon: Carmela Rima, MD;  Location: [facility] OR;  Service: Ophthalmology;  Laterality: Right;   APPENDECTOMY     BIOPSY  09/29/2021   Procedure: BIOPSY;  Surgeon: Benancio Deeds, MD;  Location: [facility];  Service: Gastroenterology;;   CATARACT EXTRACTION W/PHACO Right 11/24/2021   Procedure: CATARACT EXTRACTION AND INTRAOCULAR LENS PLACEMENT (IOC) RIGHT EYE;  Surgeon: Carmela Rima, MD;  Location: [facility] OR;  Service: Ophthalmology;  Laterality: Right;   COLONOSCOPY      COLONOSCOPY WITH PROPOFOL N/A 09/29/2021   Procedure: COLONOSCOPY WITH PROPOFOL;  Surgeon: Benancio Deeds, MD;  Location: [facility] ENDOSCOPY;  Service: Gastroenterology;  Laterality: N/A;   CORONARY ARTERY BYPASS GRAFT N/A 11/04/2018   Procedure: CORONARY ARTERY BYPASS GRAFTING (CABG), ON PUMP, TIMES THREE, USING LEFT AND RIGHT INTERNAL MAMMARY ARTERIES;  Surgeon: Linden Dolin, MD;  Location: [facility] OR;  Service: Open Heart Surgery;  Laterality: N/A;   CORONARY STENT INTERVENTION N/A 03/20/2017   Procedure: DES x 3 LAD, DES OM; Surgeon: Kathleene Hazel, MD;  Location: [facility] INVASIVE CV LAB;  Service: Cardiovascular;  Laterality: N/A;   CORONARY/GRAFT ACUTE MI REVASCULARIZATION N/A 02/08/2018   Procedure: Coronary/Graft Acute MI Revascularization;  Surgeon: Lyn Records, MD;  Location: [facility] INVASIVE CV LAB;  Service: Cardiovascular;  Laterality: N/A;  ESOPHAGOGASTRODUODENOSCOPY (EGD) WITH PROPOFOL N/A 09/29/2021   Procedure: ESOPHAGOGASTRODUODENOSCOPY (EGD) WITH PROPOFOL;  Surgeon: Benancio Deeds, MD;  Location: [facility] ENDOSCOPY;  Service: Gastroenterology;  Laterality: N/A;   GAS INSERTION Right 04/28/2021   Procedure: INSERTION OF GAS ( C3F8);  Surgeon: Carmela Rima, MD;  Location: [facility] OR;  Service: Ophthalmology;  Laterality: Right;  Right eye   GAS/FLUID EXCHANGE Right 04/28/2021   Procedure: GAS/FLUID EXCHANGE;  Surgeon: Carmela Rima, MD;  Location: [facility] OR;  Service: Ophthalmology;  Laterality: Right;  Right eye   IR FLUORO GUIDE CV LINE RIGHT  05/29/2022   IR US GUIDE VASC ACCESS RIGHT  05/30/2022   LASER PHOTO ABLATION Right 11/24/2021   Procedure: ENDO LASER PAN PHOTOCOAGULATION;  Surgeon: Carmela Rima, MD;  Location: [facility] OR;  Service: Ophthalmology;  Laterality: Right;   LASER PHOTO ABLATION Right 12/27/2021   Procedure: ENDO LASER;  Surgeon: Carmela Rima, MD;  Location: [facility] OR;  Service: Ophthalmology;  Laterality: Right;   LEFT HEART CATH AND CORONARY ANGIOGRAPHY N/A 03/20/2017    Procedure: LEFT HEART CATH AND CORONARY ANGIOGRAPHY;  Surgeon: Kathleene Hazel, MD;  Location: [facility]VAS[facility] LAB;  Service: Cardiovascular;  Laterality: N/A;   LEFT HEART CATH AND CORONARY ANGIOGRAPHY N/A 02/08/2018   Procedure: LEFT HEART CATH AND CORONARY ANGIOGRAPHY;  Surgeon: Lyn Records, MD;  Location: [facility];  Service: Cardiovascular;  Laterality: N/A;   LEFT HEART CATH AND CORONARY ANGIOGRAPHY N/A 10/29/2018   Procedure: LEFT HEART CATH AND CORONARY ANGIOGRAPHY;  Surgeon: Kathleene Hazel, MD;  Location: [facility];  Service: Cardiovascular;  Laterality: N/A;   LEFT HEART CATH AND CORS/GRAFTS ANGIOGRAPHY N/A 10/04/2020   Procedure: LEFT HEART CATH AND CORS/GRAFTS ANGIOGRAPHY;  Surgeon: Tonny Bollman, MD;  Location: [facility] INVASIVE CV LAB;  Service: Cardiovascular;  Laterality: N/A;   LEFT HEART CATH AND CORS/GRAFTS ANGIOGRAPHY N/A 05/11/2021   Procedure: LEFT HEART CATH AND CORS/GRAFTS ANGIOGRAPHY;  Surgeon: Swaziland, Peter M, MD;  Location: [facility] INVASIVE CV LAB;  Service: Cardiovascular;  Laterality: N/A;   PARS PLANA VITRECTOMY Right 04/28/2021   Procedure: PARS PLANA VITRECTOMY WITH 25 GAUGE REMOVAL OF TRATIONAL MEMBRANE RIGHT EYE; DRAINAGE OF SUBRETINAL FLUID RIGHT EYE;  Surgeon: Carmela Rima, MD;  Location: [facility] OR;  Service: Ophthalmology;  Laterality: Right;  Right eye   PARS PLANA VITRECTOMY 27 GAUGE Right 12/27/2021   Procedure: PARS PLANA VITRECTOMY 27 GAUGE;  Surgeon: Carmela Rima, MD;  Location: [facility] OR;  Service: Ophthalmology;  Laterality: Right;   PHOTOCOAGULATION WITH LASER Right 04/28/2021   Procedure: PHOTOCOAGULATION WITH LASER;  Surgeon: Carmela Rima, MD;  Location: [facility] OR;  Service: Ophthalmology;  Laterality: Right;  Right eye   POLYPECTOMY  09/29/2021   Procedure: POLYPECTOMY;  Surgeon: Benancio Deeds, MD;  Location: [facility]OSCOPY;  Service: Gastroenterology;;   RIGHT/LEFT HEART CATH AND CORONARY/GRAFT ANGIOGRAPHY N/A 05/23/2022    Procedure: RIGHT/LEFT HEART CATH AND CORONARY/GRAFT ANGIOGRAPHY;  Surgeon: Kathleene Hazel, MD;  Location: [facility]VAS[facility] CV LAB;  Service: Cardiovascular;  Laterality: N/A;   TEE WITHOUT CARDIOVERSION N/A 11/04/2018   Procedure: TRANSESOPHAGEAL ECHOCARDIOGRAM (TEE);  Surgeon: Linden Dolin, MD;  Location: [facility] OR;  Service: Open Heart Surgery;  Laterality: N/A;     Social History:   reports that she has quit smoking. Her smoking use included cigarettes. She has never used smokeless tobacco. She reports that she does not currently use alcohol. She reports current drug use. Drug: Marijuana.   Family History:  Her family history  includes Anxiety disorder in her father; Asthma in her father; Cancer in her mother; Colon polyps in her father; Congestive Heart Failure in her father; Coronary artery disease in her father; Depression in her maternal grandmother; Diabetes in her paternal grandmother; Hypercalcemia in her father and mother; Hypertension in her father and mother; OCD in her father. There is no history of Stomach cancer, Esophageal cancer, Colon cancer, or Rectal cancer.   Allergies Allergies  Allergen Reactions   Crestor [Rosuvastatin Calcium] Other (See Comments)    Myalgias  Arthralgias    Lipitor [Atorvastatin] Other (See Comments)    Myalgias    Monosodium Glutamate Nausea And Vomiting and Other (See Comments)    MSG - migraine   Zetia [Ezetimibe] Other (See Comments)    Myalgias    Ciprofloxin Hcl [Ciprofloxacin] Nausea And Vomiting and Other (See Comments)    Severe migraine   Food Color Red [Red Dye #40 (Allura Red)] Diarrhea     Home Medications  Prior to Admission medications   Medication Sig Start Date End Date Taking? Authorizing Provider  albuterol (VENTOLIN HFA) 108 (90 Base) MCG/ACT inhaler INHALE 2 PUFFS INTO THE LUNGS EVERY 6 (SIX) HOURS AS NEEDED FOR WHEEZING OR SHORTNESS OF BREATH. 10/25/21   Newlin, Odette Horns, MD  Alirocumab (PRALUENT) 150 MG/ML SOAJ  Inject 1 pen  into the skin every 14 (fourteen) days. 09/23/21   Quintella Reichert, MD  apixaban (ELIQUIS) 5 MG TABS tablet Take 1 tablet (5 mg total) by mouth 2 (two) times daily. 06/27/22   Bensimhon, Bevelyn Buckles, MD  aspirin 81 MG EC tablet Take 1 tablet (81 mg total) by mouth daily. Patient taking differently: Take 81 mg by mouth at bedtime. 12/07/17   Amin, Loura Halt, MD  buPROPion (WELLBUTRIN XL) 150 MG 24 hr tablet Take 1 tablet (150 mg total) by mouth every morning. 08/10/22 10/09/22  Bahraini, Sarah A  Continuous Glucose Sensor (DEXCOM G6 SENSOR) MISC Use as instructed to check glucose levels. Change every 10 days. 08/28/22   Carlus Pavlov, MD  Continuous Glucose Transmitter (DEXCOM G6 TRANSMITTER) MISC Use as instructed to check glucose levels. Change every 90 days. 05/29/22   Carlus Pavlov, MD  digoxin (LANOXIN) 0.125 MG tablet Take 1 tablet (0.125 mg total) by mouth daily. 05/31/22   Clegg, Amy D, NP  diphenhydrAMINE (BENADRYL) 25 MG tablet Take 37.5 mg by mouth at bedtime.    [provider]  DOBUTamine (DOBUTREX) 4-5 MG/ML-% infusion Inject 191.4 mcg/min into the vein continuous. Per [facility] home infusion 05/31/22   Clegg, Amy D, NP  DULoxetine (CYMBALTA) 30 MG capsule Take 1 capsule (30 mg total) by mouth at bedtime. 08/10/22 10/09/22  Bahraini, Sarah A  fluticasone (FLONASE) 50 MCG/ACT nasal spray Insert 1 spray each nostril daily for 3 days then use 2-3 times a week as needed Patient taking differently: Place 1 spray into both nostrils daily as needed for allergies. 05/16/22   [name], NP  Glucagon 3 MG/DOSE POWD Place 3 mg into the nose once as needed for up to 1 dose. 07/01/21   Carlus Pavlov, MD  Insulin Disposable Pump (OMNIPOD 5 G6 INTRO, GEN 5,) KIT Use as directed. 05/03/22   Carlus Pavlov, MD  Insulin Disposable Pump (OMNIPOD 5 G6 PODS, GEN 5,) MISC Use every 3 (three) days. 05/03/22   Carlus Pavlov, MD  insulin lispro (HUMALOG) 100 UNIT/ML injection Inject 0.35  mLs (35 Units total) into the skin daily via pump. Patient taking differently: Inject 35 Units  into the skin See admin instructions. 35 units daily via insulin pump 03/08/22   Carlus Pavlov, MD  levothyroxine (SYNTHROID) 100 MCG tablet Take 1 tablet (100 mcg total) by mouth daily before breakfast. 08/23/22   [name], NP  losartan (COZAAR) 25 MG tablet Take 1/2 tablet (12.5 mg total) by mouth daily. 05/31/22   Clegg, Amy D, NP  meloxicam (MOBIC) 15 MG tablet Take 15 mg by mouth daily as needed for pain.    [provider]  meloxicam (MOBIC) 15 MG tablet Take 1 tablet (15 mg total) by mouth daily. 08/30/22   Gomez Cleverly, MD  metoCLOPramide (REGLAN) 5 MG tablet Take 1 tablet (5 mg total) by mouth every 8 (eight) hours as needed for nausea or vomiting. 12/23/21 12/23/22  Armbruster, Willaim Rayas, MD  mometasone-formoterol (DULERA) 100-5 MCG/ACT AERO Inhale 2 puffs into the lungs 2 (two) times daily. 02/15/22   [name], NP  montelukast (SINGULAIR) 10 MG tablet Take 1 tablet (10 mg total) by mouth at bedtime. 08/23/22   [name], NP  Multiple Vitamin (MULTIVITAMIN WITH MINERALS) TABS tablet Take 1 tablet by mouth daily with lunch.    [provider]  nitroGLYCERIN (NITROSTAT) 0.4 MG SL tablet Place 1 tablet (0.4 mg total) under the tongue every 5 (five) minutes as needed for chest pain. 06/08/22   Bensimhon, Bevelyn Buckles, MD  ondansetron (ZOFRAN) 4 MG tablet Take 1 tablet (4 mg total) by mouth daily as needed for nausea or vomiting. 03/24/22 03/24/23  Hollice Espy, MD  pantoprazole (PROTONIX) 40 MG tablet Take 1 tablet (40 mg total) by mouth 2 (two) times daily. 08/23/22   [name], NP  spironolactone (ALDACTONE) 25 MG tablet Take 25 mg by mouth daily.    [provider]     Critical care time: 40 min.   Rutherford Guys, PA - C  Pulmonary & Critical Care Medicine For pager details, please see AMION or use Epic chat  After 1900, please call [facility] for  cross coverage needs 09/05/2022, 10:32 PM

## 2022-09-05 NOTE — ED Notes (Signed)
Doputamine home infusion continuous and remains in place , EDP Young aware

## 2022-09-05 NOTE — Progress Notes (Signed)
ANTICOAGULATION CONSULT NOTE  Pharmacy Consult for heparin Indication: chest pain/ACS  Allergies  Allergen Reactions   Crestor [Rosuvastatin Calcium] Other (See Comments)    Myalgias  Arthralgias    Lipitor [Atorvastatin] Other (See Comments)    Myalgias    Monosodium Glutamate Nausea And Vomiting and Other (See Comments)    MSG - migraine   Zetia [Ezetimibe] Other (See Comments)    Myalgias    Ciprofloxin Hcl [Ciprofloxacin] Nausea And Vomiting and Other (See Comments)    Severe migraine   Food Color Red [Red Dye #40 (Allura Red)] Diarrhea    Patient Measurements: Height: 5\' 2"  (157.5 cm) Weight: 64.3 kg (141 lb 12.1 oz) IBW/kg (Calculated) : 50.1 Heparin Dosing Weight: 63.1kg  Vital Signs: Temp: 98.7 F (37.1 C) (08/13 2127) Temp Source: Oral (08/13 2127) BP: 87/59 (08/13 2046) Pulse Rate: 114 (08/13 2046)  Labs: Recent Labs    09/05/22 1221 09/05/22 1233 09/05/22 1422 09/05/22 2058  HGB 12.9 13.9  --   --   HCT 37.3 41.0  --   --   PLT 433*  --   --   --   APTT  --   --   --  60*  CREATININE 1.16*  --   --   --   TROPONINIHS 1,217*  --  1,216*  --     Estimated Creatinine Clearance: 49.4 mL/min (A) (by C-G formula based on SCr of 1.16 mg/dL (H)).  Assessment: 65 yof presented to the ED with constipation and CP. Troponin elevated and now starting IV heparin. Baseline CBC is WNL. She is on apixaban PTA but her last dose was yesterday at ~11:30am.   aPTT is subtherapeutic at 60 on 800 units/hr. Heparin level is delayed because it has to be processed at [facility]. No bleeding noted. Suspect heparin level will still be affected by recent apixaban.  Goal of Therapy:  Heparin level 0.3-0.7 units/ml aPTT 66-103 seconds Monitor platelets by anticoagulation protocol: Yes   Plan:  Heparin 900 unit IV bolus Increase heparin gtt to 900 units/hr Check 6 hr heparin level and aPTT Daily aPTT, heparin level and CBC Monitor for s/sx of bleeding  Thank you for involving  pharmacy in this patient's care.  Loura Back, PharmD, BCPS Clinical Pharmacist Clinical phone for 09/05/2022 is [phone] 09/05/2022 9:41 PM

## 2022-09-05 NOTE — ED Notes (Signed)
Omnipod in place- pt reports managing.

## 2022-09-05 NOTE — ED Notes (Signed)
This RN reached out to [facility] ED pharmacist Christiane Ha , r/t pt home dobutamine continuous pump, orders provided to switch pt to hospital pump. Anna Consulting civil engineer at bedside to assist, pt discontinued home pump and placed on hospital pump/

## 2022-09-05 NOTE — Progress Notes (Signed)
ANTICOAGULATION CONSULT NOTE - Initial Consult  Pharmacy Consult for heparin Indication: chest pain/ACS  Allergies  Allergen Reactions   Crestor [Rosuvastatin Calcium] Other (See Comments)    Myalgias  Arthralgias    Lipitor [Atorvastatin] Other (See Comments)    Myalgias    Monosodium Glutamate Nausea And Vomiting and Other (See Comments)    MSG - migraine   Zetia [Ezetimibe] Other (See Comments)    Myalgias    Ciprofloxin Hcl [Ciprofloxacin] Nausea And Vomiting and Other (See Comments)    Severe migraine   Food Color Red [Red Dye #40 (Allura Red)] Diarrhea    Patient Measurements: Height: 5\' 2"  (157.5 cm) Weight: 64.3 kg (141 lb 12.1 oz) IBW/kg (Calculated) : 50.1 Heparin Dosing Weight: 63.1kg  Vital Signs: Temp: 97.8 F (36.6 C) (08/13 1202) Temp Source: Oral (08/13 1202) BP: 101/67 (08/13 1400) Pulse Rate: 124 (08/13 1400)  Labs: Recent Labs    09/05/22 1221 09/05/22 1233  HGB 12.9 13.9  HCT 37.3 41.0  PLT 433*  --   CREATININE 1.16*  --   TROPONINIHS 1,217*  --     Estimated Creatinine Clearance: 49.4 mL/min (A) (by C-G formula based on SCr of 1.16 mg/dL (H)).   Medical History: Past Medical History:  Diagnosis Date   Allergy    Anemia    Anxiety    Asthma    CAD S/P percutaneous coronary angioplasty 03/28/2017   Cataract    CHF (congestive heart failure) (HCC)    Coronary artery disease    a. s/p PTCA DES x3 in the LAD (ostial DES placed, mid DES placed, distal DES placed) and PTCA/DES x1 to intermediate branch 02/2017. b. ACS 02/08/18 with LCx stent placement.   Depression    Diabetes mellitus    type 1   DKA (diabetic ketoacidoses) 12/31/2017   DKA (diabetic ketoacidosis) (HCC) 01/13/2022   Dyspnea    with exertion and dust, no oxygen   Elevated platelet count    GERD (gastroesophageal reflux disease)    Heart murmur    never caused any problems   High cholesterol    Hypothyroidism    Ischemic cardiomyopathy    a. EF low-normal with  basal inferior akinesis, basal septal hypokinesis by echo 01/2018.   Myocardial infarction (HCC) 2019   Nausea and vomiting 12/04/2010   Non-ST elevation (NSTEMI) myocardial infarction (HCC) 02/08/2018   NSTEMI (non-ST elevated myocardial infarction) (HCC)    Substance abuse (HCC)    marijuana    Medications:  Infusions:   heparin      Assessment: Jennifer Hogan presented to the ED with constipation and CP. Troponin elevated and now starting IV heparin. Baseline CBC is WNL. She is on apixaban PTA but her last dose was yesterday at ~11:30am.   Goal of Therapy:  Heparin level 0.3-0.7 units/ml aPTT 66-103 seconds Monitor platelets by anticoagulation protocol: Yes   Plan:  Heparin bolus 3800 units IV x 1 Heparin gtt 800 units/hr Check a 6 hr heparin level and aPTT Daily aPTT, heparin level and CBC  , Drake Leach 09/05/2022,2:09 PM

## 2022-09-05 NOTE — ED Notes (Signed)
Pt requesting pain medication. This RN notified EDP Young

## 2022-09-05 NOTE — ED Notes (Signed)
CARELINK on unit to transfer pt to [facility]

## 2022-09-05 NOTE — ED Provider Notes (Signed)
[place] EMERGENCY DEPARTMENT AT [facility] Provider Note   CSN: [id number] Arrival date & time: 09/05/22  1149     History  Chief Complaint  Patient presents with   Constipation   Emesis    Jennifer Hogan is a 52 y.o. female.  52 year old female presenting emergency department for abdominal pain with nausea vomiting.  History of insulin-dependent diabetes, CHF, gastroparesis.  She presents to the emergency department stating that she feels like she is going into DKA.  She is a poor historian, not forthcoming with information reports being compliant with her insulin and she uses an insulin pump.  No fevers, chills, chest pain.  Her symptoms seemingly started last night generalized abdominal pain with numerous episodes of vomiting.  She states that her abdominal pain is different than her gastroparesis.  Her friend states that she has been constipated for the past 5 days.   Constipation Associated symptoms: vomiting   Emesis      Home Medications Prior to Admission medications   Medication Sig Start Date End Date Taking? Authorizing Provider  albuterol (VENTOLIN HFA) 108 (90 Base) MCG/ACT inhaler INHALE 2 PUFFS INTO THE LUNGS EVERY 6 (SIX) HOURS AS NEEDED FOR WHEEZING OR SHORTNESS OF BREATH. 10/25/21   Newlin, Odette Horns, MD  Alirocumab (PRALUENT) 150 MG/ML SOAJ Inject 1 pen  into the skin every 14 (fourteen) days. 09/23/21   Quintella Reichert, MD  apixaban (ELIQUIS) 5 MG TABS tablet Take 1 tablet (5 mg total) by mouth 2 (two) times daily. 06/27/22   Bensimhon, Bevelyn Buckles, MD  aspirin 81 MG EC tablet Take 1 tablet (81 mg total) by mouth daily. Patient taking differently: Take 81 mg by mouth at bedtime. 12/07/17   Amin, Loura Halt, MD  buPROPion (WELLBUTRIN XL) 150 MG 24 hr tablet Take 1 tablet (150 mg total) by mouth every morning. 08/10/22 10/09/22  Bahraini, Sarah A  Continuous Glucose Sensor (DEXCOM G6 SENSOR) MISC Use as instructed to check glucose levels. Change every 10  days. 08/28/22   Carlus Pavlov, MD  Continuous Glucose Transmitter (DEXCOM G6 TRANSMITTER) MISC Use as instructed to check glucose levels. Change every 90 days. 05/29/22   Carlus Pavlov, MD  digoxin (LANOXIN) 0.125 MG tablet Take 1 tablet (0.125 mg total) by mouth daily. 05/31/22   Clegg, Amy D, NP  diphenhydrAMINE (BENADRYL) 25 MG tablet Take 37.5 mg by mouth at bedtime.    [provider]  DOBUTamine (DOBUTREX) 4-5 MG/ML-% infusion Inject 191.4 mcg/min into the vein continuous. Per [facility] home infusion 05/31/22   Clegg, Amy D, NP  DULoxetine (CYMBALTA) 30 MG capsule Take 1 capsule (30 mg total) by mouth at bedtime. 08/10/22 10/09/22  Bahraini, Sarah A  fluticasone (FLONASE) 50 MCG/ACT nasal spray Insert 1 spray each nostril daily for 3 days then use 2-3 times a week as needed Patient taking differently: Place 1 spray into both nostrils daily as needed for allergies. 05/16/22   [name], NP  Glucagon 3 MG/DOSE POWD Place 3 mg into the nose once as needed for up to 1 dose. 07/01/21   Carlus Pavlov, MD  Insulin Disposable Pump (OMNIPOD 5 G6 INTRO, GEN 5,) KIT Use as directed. 05/03/22   Carlus Pavlov, MD  Insulin Disposable Pump (OMNIPOD 5 G6 PODS, GEN 5,) MISC Use every 3 (three) days. 05/03/22   Carlus Pavlov, MD  insulin lispro (HUMALOG) 100 UNIT/ML injection Inject 0.35 mLs (35 Units total) into the skin daily via pump. Patient taking differently: Inject 35 Units  into the skin See admin instructions. 35 units daily via insulin pump 03/08/22   Carlus Pavlov, MD  levothyroxine (SYNTHROID) 100 MCG tablet Take 1 tablet (100 mcg total) by mouth daily before breakfast. 08/23/22   [name], NP  losartan (COZAAR) 25 MG tablet Take 1/2 tablet (12.5 mg total) by mouth daily. 05/31/22   Clegg, Amy D, NP  meloxicam (MOBIC) 15 MG tablet Take 15 mg by mouth daily as needed for pain.    [provider]  meloxicam (MOBIC) 15 MG tablet Take 1 tablet (15 mg total) by mouth  daily. 08/30/22   Gomez Cleverly, MD  metoCLOPramide (REGLAN) 5 MG tablet Take 1 tablet (5 mg total) by mouth every 8 (eight) hours as needed for nausea or vomiting. 12/23/21 12/23/22  Armbruster, Willaim Rayas, MD  mometasone-formoterol (DULERA) 100-5 MCG/ACT AERO Inhale 2 puffs into the lungs 2 (two) times daily. 02/15/22   [name], NP  montelukast (SINGULAIR) 10 MG tablet Take 1 tablet (10 mg total) by mouth at bedtime. 08/23/22   [name], NP  Multiple Vitamin (MULTIVITAMIN WITH MINERALS) TABS tablet Take 1 tablet by mouth daily with lunch.    [provider]  nitroGLYCERIN (NITROSTAT) 0.4 MG SL tablet Place 1 tablet (0.4 mg total) under the tongue every 5 (five) minutes as needed for chest pain. 06/08/22   Bensimhon, Bevelyn Buckles, MD  ondansetron (ZOFRAN) 4 MG tablet Take 1 tablet (4 mg total) by mouth daily as needed for nausea or vomiting. 03/24/22 03/24/23  Hollice Espy, MD  pantoprazole (PROTONIX) 40 MG tablet Take 1 tablet (40 mg total) by mouth 2 (two) times daily. 08/23/22   [name], NP  spironolactone (ALDACTONE) 25 MG tablet Take 25 mg by mouth daily.    [provider]      Allergies    Crestor [rosuvastatin calcium], Lipitor [atorvastatin], Monosodium glutamate, Zetia [ezetimibe], Ciprofloxin hcl [ciprofloxacin], and Food color red [red dye #40 (allura red)]    Review of Systems   Review of Systems  Gastrointestinal:  Positive for constipation and vomiting.    Physical Exam Updated Vital Signs BP 118/85   Pulse (!) 128   Temp 97.8 F (36.6 C) (Oral)   Resp 18   Ht 5\' 2"  (1.575 m)   Wt 64.3 kg   LMP 06/06/2022   SpO2 100%   BMI 25.93 kg/m  Physical Exam Vitals and nursing note reviewed.  Constitutional:      Comments: Uncomfortable appearing  HENT:     Nose: Nose normal.     Mouth/Throat:     Mouth: Mucous membranes are moist.  Eyes:     Conjunctiva/sclera: Conjunctivae normal.  Cardiovascular:     Rate and Rhythm: Regular rhythm.  Tachycardia present.  Pulmonary:     Effort: Pulmonary effort is normal.     Breath sounds: Normal breath sounds.  Abdominal:     General: Abdomen is flat. There is no distension.     Tenderness: There is abdominal tenderness (mild diffuse). There is no guarding or rebound.  Musculoskeletal:     Right lower leg: No edema.     Left lower leg: No edema.  Skin:    General: Skin is warm.     Capillary Refill: Capillary refill takes less than 2 seconds.  Neurological:     Mental Status: She is alert and oriented to person, place, and time.     ED Results / Procedures / Treatments   Labs (all  labs ordered are listed, but only abnormal results are displayed) Labs Reviewed  CBC - Abnormal; Notable for the following components:      Result Value   WBC 13.4 (*)    RDW 15.9 (*)    Platelets 433 (*)    All other components within normal limits  COMPREHENSIVE METABOLIC PANEL - Abnormal; Notable for the following components:   Sodium 134 (*)    CO2 21 (*)    Glucose, Bld 173 (*)    BUN 23 (*)    Creatinine, Ser 1.16 (*)    Calcium 10.6 (*)    Total Protein 8.8 (*)    GFR, Estimated 56 (*)    All other components within normal limits  LIPASE, BLOOD - Abnormal; Notable for the following components:   Lipase <10 (*)    All other components within normal limits  BETA-HYDROXYBUTYRIC ACID - Abnormal; Notable for the following components:   Beta-Hydroxybutyric Acid 1.48 (*)    All other components within normal limits  LACTIC ACID, PLASMA - Abnormal; Notable for the following components:   Lactic Acid, Venous 2.1 (*)    All other components within normal limits  DIGOXIN LEVEL - Abnormal; Notable for the following components:   Digoxin Level 0.5 (*)    All other components within normal limits  CBG MONITORING, ED - Abnormal; Notable for the following components:   Glucose-Capillary 167 (*)    All other components within normal limits  I-STAT VENOUS BLOOD GAS, ED - Abnormal; Notable for  the following components:   pH, Ven 7.454 (*)    pCO2, Ven 29.2 (*)    TCO2 21 (*)    All other components within normal limits  TROPONIN I (HIGH SENSITIVITY) - Abnormal; Notable for the following components:   Troponin I (High Sensitivity) 1,217 (*)    All other components within normal limits  RESP PANEL BY RT-PCR (RSV, FLU A&B, COVID)  RVPGX2  LACTIC ACID, PLASMA  URINALYSIS, ROUTINE W REFLEX MICROSCOPIC  HEPARIN LEVEL (UNFRACTIONATED)  APTT  CBG MONITORING, ED  TROPONIN I (HIGH SENSITIVITY)    EKG EKG Interpretation Date/Time:  Tuesday September 05 2022 12:01:26 EDT Ventricular Rate:  128 PR Interval:  132 QRS Duration:  88 QT Interval:  324 QTC Calculation: 473 R Axis:   -72  Text Interpretation: Sinus tachycardia Biatrial enlargement Left axis deviation Pulmonary disease pattern Septal infarct , age undetermined Abnormal ECG When compared with ECG of 28-Jun-2022 03:38, PREVIOUS ECG IS PRESENT Confirmed by Estanislado Pandy [phone]) on 09/05/2022 12:36:15 PM  Radiology DG Chest Port 1 View  Result Date: 09/05/2022 CLINICAL DATA:  Chest discomfort EXAM: PORTABLE CHEST 1 VIEW COMPARISON:  CXR 06/27/22 FINDINGS: No pleural effusion. No pneumothorax. Status post median sternotomy. No radiographically apparent displaced rib fractures. Visualized upper abdomen is unremarkable. No focal airspace opacity. Right-sided central venous catheter with the tip in the lower SVC. Degenerative changes of the bilateral glenohumeral joints. IMPRESSION: No focal airspace opacity Electronically Signed   By: Lorenza Cambridge M.D.   On: 09/05/2022 13:30    Procedures .Critical Care  Performed by: Coral Spikes, DO Authorized by: Coral Spikes, DO   Critical care provider statement:    Critical care time (minutes):  30   Critical care was necessary to treat or prevent imminent or life-threatening deterioration of the following conditions:  Cardiac failure, circulatory failure and shock   Critical care  was time spent personally by me on the following activities:  Development of  treatment plan with patient or surrogate, discussions with consultants, evaluation of patient's response to treatment, examination of patient, interpretation of cardiac output measurements, obtaining history from patient or surrogate, ordering and performing treatments and interventions, ordering and review of laboratory studies, ordering and review of radiographic studies, pulse oximetry, re-evaluation of patient's condition and review of old charts   Care discussed with: admitting provider       Medications Ordered in ED Medications  promethazine (PHENERGAN) 25 MG/ML injection (has no administration in time range)  HYDROmorphone (DILAUDID) injection 0.5 mg (0 mg Intravenous Hold 09/05/22 1414)  aspirin tablet 325 mg (325 mg Oral Given 09/05/22 1414)  heparin ADULT infusion 100 units/mL (25000 units/238mL) (800 Units/hr Intravenous New Bag/Given 09/05/22 1448)  sodium chloride 0.9 % bolus 500 mL (500 mLs Intravenous New Bag/Given 09/05/22 1224)  promethazine (PHENERGAN) 12.5 mg in sodium chloride 0.9 % 50 mL IVPB (0 mg Intravenous Stopped 09/05/22 1317)  iohexol (OMNIPAQUE) 300 MG/ML solution 100 mL (70 mLs Intravenous Contrast Given 09/05/22 1339)  heparin bolus via infusion 3,800 Units (3,800 Units Intravenous Bolus from Bag 09/05/22 1448)    ED Course/ Medical Decision Making/ A&P Clinical Course as of 09/05/22 1520  Tue Sep 05, 2022  1213 ECHO 08/17/22: "1. Left ventricular ejection fraction, by estimation, is 45 to 50%. The  left ventricle has mildly decreased function. The anteroseptal walls and  apex appear hypokinetic. Left ventricular diastolic parameters are  indeterminant." [TY]  1236 pH, Ven(!): 7.454 Not consistent with DKA [TY]  1332 Comprehensive metabolic panel(!) Mild elevation.  No transaminitis to suggest hepatobiliary disease.  Hyperglycemic with glucose 173.  Bicarb is 21, but no anion gap.  Labs not  consistent with DKA. [TY]  1333 Lipase(!): <10 Pancreatitis unlikely [TY]  1403 Dr. Wyline Mood, with cardiology recommending medicine admission with Cardiology consult after reviewing ecg and trops. Recommends starting heparin drip.  [TY]  1515 PMH per chart review: "52 y.o.with HFrEF, CAD, s/p CABG in 10/2018 (LIMA to the LAD/ramus intermedius and RIMA to the obtuse marginal), DM Type I,  anemia, anxiety, hypothyroidism, and hyperlipidemia." [TY]  1517 Patient does not appear to be in overt DKA.  Symptoms seemingly improved with some IV fluids and Phenergan.  She does have elevated troponin with EKG showing sinus tachycardia.  No sign of infection.  CT of abdomen independently reviewed by myself with no glaring acute surgical pathology.  Case discussed with cardiology recommending heparin and admission.  Case discussed with hospitalist who does not think that they can admit patient as she is on a home dobutamine drip. Spoke with ICU, Dr. Katrinka Blazing who will admit.  [TY]    Clinical Course User Index [TY] Coral Spikes, DO                                 Medical Decision Making Is a 52 year old female with complex past medical history includesHistory to include insulin-dependent diabetes with prior episodes of DKA.  Prior MI.  CHF, substance abuse presented today with generalized abdominal pain, nausea vomiting.  She is tachycardic, soft blood pressure.  She is tachypneic. she is mentating well, with no gross localizing neurodeficits.  Her abdominal exam is soft with some mild generalized tenderness.  Does have reported history of gastroparesis, but is stating that her pain is different today.  States that Phenergan typically works for her nausea, will try Phenergan and IV fluids while we await broad lab workup.  Amount and/or Complexity of Data Reviewed Independent Historian:     Details: Friend reports no bowel movement x 7 days External Data Reviewed: notes.    Details: Recent NSTEMI in April per  chart review.  Discharged on dobutamine drip. Labs: ordered. Decision-making details documented in ED Course. Radiology: ordered. Decision-making details documented in ED Course. ECG/medicine tests:  Decision-making details documented in ED Course.    Details: EKG sinus tachycardia.  Some nonspecific changes.  Risk OTC drugs. Prescription drug management. Decision regarding hospitalization.           Final Clinical Impression(s) / ED Diagnoses Final diagnoses:  NSTEMI (non-ST elevated myocardial infarction) [facility])    Rx / DC Orders ED Discharge Orders     None         Coral Spikes, DO 09/05/22 1520

## 2022-09-05 NOTE — Progress Notes (Addendum)
eLink Physician-Brief Progress Note Patient Name: Jennifer Hogan DOB: 06-01-1970 MRN: [id number]   Date of Service  09/05/2022  HPI/Events of Note  52 year old woman from outside facility now with abdominal pain, nausea and vomiting, CT with nothing acute . Labs with significant rise in troponin. Recent ACS in April and on home dobutamine. Started on iv heparin per cardiology. Initial HR 137. HR now 114. BP is ok. O2 sat 100 on nasal 02. Not in distress.   eICU Interventions  Bedside team to admit.  Call E link if needed      Intervention Category Major Interventions: Other:;Arrhythmia - evaluation and management Evaluation Type: New Patient Evaluation  Oretha Milch 09/05/2022, 10:15 PM  Addendum at 11:20 pm- Requesting benadryl to help her sleep and says it works well for her. Order placed.  Also notified that patient will not turn off her insulin pump and was told by diabetic educator last time that she should use it even when admitted. She gave herself insulin just now before RN could check it as well. We told her it would be safer for [name] to check and administer per our scale but she wants to continue using her pump and will discuss turning it off in the AM. Will continue to monitor the glucose per protocol.

## 2022-09-06 ENCOUNTER — Inpatient Hospital Stay (HOSPITAL_COMMUNITY): Payer: Medicare Other

## 2022-09-06 DIAGNOSIS — R079 Chest pain, unspecified: Secondary | ICD-10-CM | POA: Diagnosis not present

## 2022-09-06 DIAGNOSIS — I214 Non-ST elevation (NSTEMI) myocardial infarction: Secondary | ICD-10-CM | POA: Diagnosis not present

## 2022-09-06 DIAGNOSIS — Z86718 Personal history of other venous thrombosis and embolism: Secondary | ICD-10-CM | POA: Diagnosis not present

## 2022-09-06 DIAGNOSIS — N179 Acute kidney failure, unspecified: Secondary | ICD-10-CM | POA: Diagnosis not present

## 2022-09-06 DIAGNOSIS — E1010 Type 1 diabetes mellitus with ketoacidosis without coma: Secondary | ICD-10-CM

## 2022-09-06 LAB — ECHOCARDIOGRAM COMPLETE
Area-P 1/2: 4.8 cm2
Height: 65 in
S' Lateral: 3.5 cm
Single Plane A4C EF: 50.6 %
Weight: 2172.85 oz

## 2022-09-06 LAB — GLUCOSE, CAPILLARY
Glucose-Capillary: 240 mg/dL — ABNORMAL HIGH (ref 70–99)
Glucose-Capillary: 147 mg/dL — ABNORMAL HIGH (ref 70–99)
Glucose-Capillary: 291 mg/dL — ABNORMAL HIGH (ref 70–99)
Glucose-Capillary: 234 mg/dL — ABNORMAL HIGH (ref 70–99)
Glucose-Capillary: 263 mg/dL — ABNORMAL HIGH (ref 70–99)

## 2022-09-06 LAB — APTT
aPTT: 57 seconds — ABNORMAL HIGH (ref 24–36)
aPTT: 58 seconds — ABNORMAL HIGH (ref 24–36)

## 2022-09-06 MED ORDER — INSULIN PUMP
Freq: Three times a day (TID) | SUBCUTANEOUS | Status: DC
  Administered 2022-09-09: 1.8 via SUBCUTANEOUS
  Administered 2022-09-09: 2.5 via SUBCUTANEOUS
  Administered 2022-09-10: 1.1 via SUBCUTANEOUS
  Filled 2022-09-06: qty 1

## 2022-09-06 MED ORDER — LORAZEPAM 0.5 MG PO TABS
0.5000 mg | ORAL_TABLET | Freq: Every day | ORAL | Status: DC
  Administered 2022-09-06 – 2022-09-10 (×5): 0.5 mg via ORAL
  Filled 2022-09-06 (×5): qty 1

## 2022-09-06 MED ORDER — SODIUM CHLORIDE 0.9 % IV SOLN: INTRAVENOUS | Status: DC | PRN

## 2022-09-06 MED ORDER — ONDANSETRON HCL 4 MG/2ML IJ SOLN
INTRAMUSCULAR | Status: AC
  Administered 2022-09-06: 4 mg via INTRAVENOUS
  Filled 2022-09-06: qty 2

## 2022-09-06 MED ORDER — PROMETHAZINE (PHENERGAN) 6.25MG IN NS 50ML IVPB
6.2500 mg | Freq: Four times a day (QID) | INTRAVENOUS | Status: AC | PRN
  Administered 2022-09-06 – 2022-09-07 (×2): 6.25 mg via INTRAVENOUS
  Filled 2022-09-06: qty 6.25
  Filled 2022-09-06: qty 0.25

## 2022-09-06 MED ORDER — METOCLOPRAMIDE HCL 5 MG/ML IJ SOLN
5.0000 mg | Freq: Four times a day (QID) | INTRAMUSCULAR | Status: DC | PRN
  Administered 2022-09-06 – 2022-09-07 (×3): 5 mg via INTRAVENOUS
  Filled 2022-09-06 (×3): qty 2

## 2022-09-06 MED ORDER — MORPHINE SULFATE (PF) 2 MG/ML IV SOLN
1.0000 mg | INTRAVENOUS | Status: AC | PRN
  Administered 2022-09-06 – 2022-09-07 (×2): 1 mg via INTRAVENOUS
  Filled 2022-09-06 (×2): qty 1

## 2022-09-06 MED ORDER — POTASSIUM CHLORIDE CRYS ER 20 MEQ PO TBCR
40.0000 meq | EXTENDED_RELEASE_TABLET | Freq: Once | ORAL | Status: AC
  Administered 2022-09-06: 40 meq via ORAL
  Filled 2022-09-06: qty 2

## 2022-09-06 MED ORDER — ONDANSETRON HCL 4 MG/2ML IJ SOLN: 4.0000 mg | Freq: Four times a day (QID) | INTRAMUSCULAR | Status: DC | PRN

## 2022-09-06 NOTE — Progress Notes (Addendum)
NAME:  Jennifer Hogan, MRN:  [id number], DOB:  [birth year], LOS: 1 ADMISSION DATE:  09/05/2022, CONSULTATION DATE:  09/05/2022 REFERRING MD:  [facility] CHIEF COMPLAINT:  Weakness, N/V   History of Present Illness:  Jennifer Hogan is a 52 y.o. 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 She is followed by Dr. Gala Romney as an outpatient and is on chronic Dobutamine at 3 mcg/kg/min via PICC (has been on this since April 2024).  She presented to [facility] 8/13 with generalized weakness, constipation, nausea. She felt as if she had DKA as this was a similar presentation/symptoms to a prior episode of DKA for which she required admission. Glucose in ED 167, CO2 21, normal anion gap. Beta hydroxybutyric acid mildly up at 1.48. Lactate normal.  She was found to have NSTEMI with troponins 1216. She was started on Heparin infusion and was transferred to [facility] for ICU admission and further workup/management.  Upon arrival to [facility], she subjectively feels much improved. Currently nausea free. SBP around 90-100 which she states is baseline. She is on DBA 3 (baseline dose) and is chest pain free. Feels tired, hoping to get some rest tonight.  Pertinent Medical History:   Past Medical History:  Diagnosis Date   Allergy    Anemia    Anxiety    Asthma    CAD S/P percutaneous coronary angioplasty 03/28/2017   Cataract    CHF (congestive heart failure) (HCC)    Coronary artery disease    a. s/p PTCA DES x3 in the LAD (ostial DES placed, mid DES placed, distal DES placed) and PTCA/DES x1 to intermediate branch 02/2017. b. ACS 02/08/18 with LCx stent placement.   Depression    Diabetes mellitus    type 1   DKA (diabetic ketoacidoses) 12/31/2017   DKA (diabetic ketoacidosis) (HCC) 01/13/2022    Dyspnea    with exertion and dust, no oxygen   Elevated platelet count    GERD (gastroesophageal reflux disease)    Heart murmur    never caused any problems   High cholesterol    Hypothyroidism    Ischemic cardiomyopathy    a. EF low-normal with basal inferior akinesis, basal septal hypokinesis by echo 01/2018.   Myocardial infarction [facility]) 2019   Nausea and vomiting 12/04/2010   Non-ST elevation (NSTEMI) myocardial infarction [facility]) 02/08/2018   NSTEMI (non-ST elevated myocardial infarction) [facility])    Substance abuse (HCC)    marijuana   Significant Hospital Events: Including procedures, antibiotic start and stop dates in addition to other pertinent events   8/13 - Admitted from [facility]. 8/14 - Trop 1484 (1216). Cardiology consulted.  Interim History / Subjective:  Feeling much better Chest pain, nausea resolved Remains stable on chronic dobutamine Stable for transfer to cardiac progressive/tele  Objective:  Blood pressure 101/65, pulse 92, temperature 98.1 F (36.7 C), temperature source Oral, resp. rate 20, height 5\' 5"  (1.651 m), weight 61.6 kg, last menstrual period 06/06/2022, SpO2 97%.        Intake/Output Summary (Last 24 hours) at 09/06/2022 0734 Last data filed at 09/06/2022 0644 Gross per 24 hour  Intake 190.82 ml  Output 275 ml  Net -84.18 ml   Filed Weights   09/05/22 1400 09/05/22 2215  Weight: 64.3  kg 61.6 kg   Physical Examination: General: Acutely ill-appearing middle-aged woman in NAD. Pleasant and conversant. HEENT: [place]/AT, anicteric sclera, PERRL, moist mucous membranes. Neuro: Awake, oriented x 4. Responds to verbal stimuli. Following commands consistently. Moves all 4 extremities spontaneously. Strength 5/5 in all 4 extremities.  CV: RRR, +systolic murmur III/VI. PULM: Breathing even and unlabored on RA. Lung fields CTAB. GI: Soft, nontender, nondistended. Normoactive bowel sounds. Extremities: No LE edema noted. Skin: Warm/dry, no  rashes.  Labs/imaging personally reviewed:  CT A/P 8/13 > neg  Assessment & Plan:   NSTEMI, likely Type II demand-related Chest pain Troponins 1216 > 1484 with concern for NSTEMI; EKG without ischemic changes. - Cardiology consulted, following - No indication for LHC at present, per Cards; recommend continued medical management - Heparin gtt per protocol for ACS - ASA, no statin in the setting of intolerance, has Praluent at home - Cardiac monitoring  - D/w AHF, do not need to follow this admission, ongoing outpatient VAD workup  Acute-on-chronic systolic CHF/end stage CHF with severe ICM - no revascularization options On Dobutamine 3 chronically, followed by Dr. Gala Romney as outpatient. Echo 08/17/22 with improved EF to 45-50% (previously 20-25% in April). Hx CAD, NSTEMI, HLD, chronic hypotension (SBP 90s per pt). - AHF consult today, 8/14 - Continue dobutamine (PTA) - Continue digoxin (level low 8/13, may need uptitration of dose), defer to AHF - Goal SBP > 90 - Hold home losartan, Aldactone  T1DM - SSI - CBGs Q4H - Goal CBG 140-180  AKI, likely prerenal in the setting of dehydration/GI losses - Trend BMP - Replete electrolytes as indicated - Monitor I&Os - F/u urine studies - Avoid nephrotoxic agents as able - Ensure adequate renal perfusion  Nausea - Antiemetics PRN  Hx LIJ DVT on Eliquis - Heparin gtt - Hold home Eliquis, resume once closer to discharge  Hx Hypothyroidism - Continue Synthroid  Stable on home dobutamine, can transfer out of ICU to cardiac progressive/cardiac tele. Will sign out to [facility] for pickup 8/15.  Best practice (evaluated daily):  Diet/type: Regular consistency (see orders) DVT prophylaxis: systemic heparin GI prophylaxis: N/A Lines: Central line (PICC) Foley:  N/A Code Status:  full code Last date of multidisciplinary goals of care discussion: None yet  Critical care time:   The patient is critically ill with multiple organ  system failure and requires high complexity decision making for assessment and support, frequent evaluation and titration of therapies, advanced monitoring, review of radiographic studies and interpretation of complex data.   Critical Care Time devoted to patient care services, exclusive of separately billable procedures, described in this note is 33 minutes.  Tim Lair, PA-C [facility] 09/06/22 7:36 AM  Please see [email or web address] for pager details.  From 7A-7P if no response, please call [phone] After hours, please call ELink [phone]

## 2022-09-06 NOTE — Progress Notes (Addendum)
ANTICOAGULATION CONSULT NOTE  Pharmacy Consult for heparin Indication: chest pain/ACS  Allergies  Allergen Reactions   Crestor [Rosuvastatin Calcium] Other (See Comments)    Myalgias  Arthralgias    Lipitor [Atorvastatin] Other (See Comments)    Myalgias    Monosodium Glutamate Nausea And Vomiting and Other (See Comments)    MSG - migraine   Zetia [Ezetimibe] Other (See Comments)    Myalgias    Ciprofloxin Hcl [Ciprofloxacin] Nausea And Vomiting and Other (See Comments)    Severe migraine   Food Color Red [Red Dye #40 (Allura Red)] Diarrhea    Patient Measurements: Height: 5\' 5"  (165.1 cm) Weight: 61.6 kg (135 lb 12.9 oz) IBW/kg (Calculated) : 57 Heparin Dosing Weight: 63.1kg  Vital Signs: Temp: 98.3 F (36.8 C) (08/14 1114) Temp Source: Oral (08/14 1114) BP: 90/70 (08/14 1200) Pulse Rate: 110 (08/14 1200)  Labs: Recent Labs    09/05/22 1221 09/05/22 1233 09/05/22 1422 09/05/22 2038 09/05/22 2058 09/06/22 0149 09/06/22 1123  HGB 12.9 13.9  --   --   --  11.5*  --   HCT 37.3 41.0  --   --   --  34.2*  --   PLT 433*  --   --   --   --  355  --   APTT  --   --   --   --  60* 66* 57*  HEPARINUNFRC  --   --   --  >1.10*  --   --   --   CREATININE 1.16*  --   --   --   --  1.44*  --   TROPONINIHS 1,217*  --  1,216*  --   --  1,484*  --     Estimated Creatinine Clearance: 40.7 mL/min (A) (by C-G formula based on SCr of 1.44 mg/dL (H)).  Assessment: 44 yof presented to the ED with constipation and CP. Troponin elevated and now starting IV heparin. Baseline CBC is WNL. She is on apixaban PTA due to a DVT in 05/2022. Her last dose of apixaban was 8/12 at ~11:30am.   8/14 1200: aPTT level 66 (borderline subtherapeutic) earlier this morning on 900 units/hr. Rechecked at 1100 and aPTT level was 54, subtherapeutic on heparin 900 units/hr. No bleeding or issues with the infusion noted. CBC stable. No plans for catheterization at this time, but will continue heparin for  history of DVT.  Goal of Therapy:  Heparin level 0.3-0.7 units/ml aPTT 66-103 seconds Monitor platelets by anticoagulation protocol: Yes   Plan:  Increase heparin gtt to 1000 units/hr Check aPTT in 6 hrs Daily aPTT, heparin level and CBC Monitor for s/sx of bleeding  Thank you for involving pharmacy in this patient's care.  Enos Fling, PharmD PGY-1 Acute Care Pharmacy Resident 09/06/2022 12:53 PM

## 2022-09-06 NOTE — Progress Notes (Signed)
PCCM:   52 year old female admitted for NSTEMI.  Chronic heart failure patient on continuous dobutamine at home. Past medical history includes diabetes.  She has been worked up for transplant in the past.  BP 97/74   Pulse (!) 107   Temp 97.7 F (36.5 C) (Oral)   Resp 18   Ht 5\' 5"  (1.651 m)   Wt 61.6 kg   LMP 06/06/2022   SpO2 100%   BMI 22.60 kg/m   General: Middle-aged female resting comfortably in bed no distress Heart: Regular rhythm S1-S2 Lungs: Clear to auscultation bilaterally  Labs: Reviewed  Assessment: NSTEMI Acute on chronic systolic heart failure Severe ischemic cardiomyopathy end-stage disease on continuous dobutamine at baseline. Diabetes AKI  Plan: Patient hemodynamically stable. Would consider transfer to cardiac telemetry. Continue on dobutamine infusion as well as other heart failure meds per cardiology service. Suspect potentially need for cardiac catheterization.   Josephine Igo, DO [facility] 09/06/2022 9:48 AM

## 2022-09-06 NOTE — Progress Notes (Addendum)
Rounding Note    Patient Name: Jennifer Hogan Date of Encounter: 09/06/2022  [facility] Cardiologist: Armanda Magic, MD   Subjective   Patient feels well.  She denies any chest pain.  Nausea and vomiting has resolved.  Patient admitted following protracted episode leading to associated dehydration.  Inpatient Medications    Scheduled Meds:  aspirin EC  81 mg Oral Daily   buPROPion  150 mg Oral Daily   Chlorhexidine Gluconate Cloth  6 each Topical Daily   digoxin  0.125 mg Oral Daily   insulin pump   Subcutaneous TID WC, HS, 0200   levothyroxine  100 mcg Oral QAC breakfast   mometasone-formoterol  2 puff Inhalation BID   montelukast  10 mg Oral QHS   pantoprazole  40 mg Oral BID   Continuous Infusions:  DOBUTamine 3 mcg/kg/min (09/06/22 0800)   heparin 900 Units/hr (09/06/22 0800)   PRN Meds: metoCLOPramide, ondansetron, mouth rinse, polyethylene glycol   Vital Signs    Vitals:   09/06/22 0800 09/06/22 0830 09/06/22 0900 09/06/22 1000  BP: 112/61 113/79 97/74 101/60  Pulse: 96 (!) 104 (!) 107 96  Resp: (!) 25 15 18 16   Temp:      TempSrc:      SpO2: 100% 100% 100% 100%  Weight:      Height:        Intake/Output Summary (Last 24 hours) at 09/06/2022 1121 Last data filed at 09/06/2022 1000 Gross per 24 hour  Intake 226.5 ml  Output 425 ml  Net -198.5 ml      09/05/2022   10:15 PM 09/05/2022    2:00 PM 08/23/2022   10:02 AM  Last 3 Weights  Weight (lbs) 135 lb 12.9 oz 141 lb 12.1 oz 141 lb 12.8 oz  Weight (kg) 61.6 kg 64.3 kg 64.32 kg      Telemetry    Sinus tachycardia at 112 - Personally Reviewed  ECG    SInus tachycardia at 128, LAE, QS V1-3 - Personally Reviewed  Physical Exam    BP (!) 92/57   Pulse 92   Temp 97.7 F (36.5 C) (Oral)   Resp 15   Ht 5\' 5"  (1.651 m)   Wt 61.6 kg   LMP 06/06/2022   SpO2 100%   BMI 22.60 kg/m  General: Alert, oriented, no distress.  Skin: normal turgor, no rashes, warm and dry HEENT:  Normocephalic, atraumatic. PNose without nasal septal hypertrophy Mouth/Parynx benign;  Neck: No JVD, no carotid bruits; normal carotid upstroke Lungs: clear to ausculatation and percussion; no wheezing or rales Chest wall: without tenderness to palpitation Heart: PMI not displaced, RRR, s1 s2 normal, 1/6 systolic murmur, no diastolic murmur, no rubs, gallops, thrills, or heaves Abdomen: soft, nontender; no hepatosplenomehaly, BS+; abdominal aorta nontender and not dilated by palpation. Back: no CVA tenderness Pulses 2+ Musculoskeletal: full range of motion, normal strength, no joint deformities Extremities: no clubbing cyanosis or edema, Homan's sign negative  Neurologic: grossly nonfocal; Cranial nerves grossly wnl Psychologic: Normal mood and affect   Labs    High Sensitivity Troponin:   Recent Labs  Lab 09/05/22 1221 09/05/22 1422 09/06/22 0149  TROPONINIHS 1,217* 1,216* 1,484*     Chemistry Recent Labs  Lab 08/31/22 1256 09/05/22 1221 09/05/22 1233 09/06/22 0149  NA 137 134* 136 135  K 4.0 3.7 3.7 3.6  CL 104 98  --  99  CO2 21* 21*  --  19*  GLUCOSE 148* 173*  --  217*  BUN 14 23*  --  24*  CREATININE 0.98 1.16*  --  1.44*  CALCIUM 8.9 10.6*  --  9.3  MG 2.0  --   --  2.3  PROT 6.7 8.8*  --   --   ALBUMIN 3.6 4.9  --   --   AST 24 33  --   --   ALT 21 25  --   --   ALKPHOS 76 105  --   --   BILITOT <0.1* 0.7  --   --   GFRNONAA >60 56*  --  43*  ANIONGAP 12 15  --  17*    Lipids No results for input(s): "CHOL", "TRIG", "HDL", "LABVLDL", "LDLCALC", "CHOLHDL" in the last 168 hours.  Hematology Recent Labs  Lab 08/31/22 1256 09/05/22 1221 09/05/22 1233 09/06/22 0149  WBC 4.8 13.4*  --  10.5  RBC 3.64* 4.28  --  3.84*  HGB 10.9* 12.9 13.9 11.5*  HCT 33.3* 37.3 41.0 34.2*  MCV 91.5 87.1  --  89.1  MCH 29.9 30.1  --  29.9  MCHC 32.7 34.6  --  33.6  RDW 15.3 15.9*  --  16.5*  PLT 361 433*  --  355   Thyroid No results for input(s): "TSH", "FREET4" in  the last 168 hours.  BNPNo results for input(s): "BNP", "PROBNP" in the last 168 hours.  DDimer No results for input(s): "DDIMER" in the last 168 hours.   Radiology    CT ABDOMEN PELVIS W CONTRAST  Result Date: 09/05/2022 CLINICAL DATA:  Constipation, vomiting EXAM: CT ABDOMEN AND PELVIS WITH CONTRAST TECHNIQUE: Multidetector CT imaging of the abdomen and pelvis was performed using the standard protocol following bolus administration of intravenous contrast. RADIATION DOSE REDUCTION: This exam was performed according to the departmental dose-optimization program which includes automated exposure control, adjustment of the mA and/or kV according to patient size and/or use of iterative reconstruction technique. CONTRAST:  70mL OMNIPAQUE IOHEXOL 300 MG/ML  SOLN COMPARISON:  05/20/2022 FINDINGS: Lower chest: Small linear densities are seen in left lower lung field, possibly scarring. Hepatobiliary: No focal abnormalities are seen in liver. Gallbladder is unremarkable. There is no dilation of bile ducts. Pancreas: No focal abnormalities are seen. Spleen: Unremarkable. Adrenals/Urinary Tract: Adrenals are unremarkable. There is no hydronephrosis. There are no renal or ureteral stones. There is a 3 mm low-density in the lower pole of right kidney, possibly a cyst. Urinary bladder is unremarkable. Stomach/Bowel: Small hiatal hernia is seen. Small bowel loops are not dilated. Appendix is not seen. There is no pericecal inflammation. There is no significant wall thickening in colon. There is no pericolic stranding. Vascular/Lymphatic: Scattered arterial calcifications are seen in aorta and its major branches. Reproductive: Unremarkable. Other: There is no ascites or pneumoperitoneum. Musculoskeletal: Degenerative changes are noted in lumbar spine, most severe at L3-L4 and L4-L5 levels with spinal stenosis and encroachment of neural foramina. No interval changes are noted. IMPRESSION: No acute findings are seen in CT  scan of abdomen and pelvis. There is no evidence of intestinal obstruction or pneumoperitoneum. There is no hydronephrosis. Small hiatal hernia. Tiny right renal cyst. Aortic arteriosclerosis. Lumbar spondylosis. Electronically Signed   By: Ernie Avena M.D.   On: 09/05/2022 15:30   DG Chest Port 1 View  Result Date: 09/05/2022 CLINICAL DATA:  Chest discomfort EXAM: PORTABLE CHEST 1 VIEW COMPARISON:  CXR 06/27/22 FINDINGS: No pleural effusion. No pneumothorax. Status post median sternotomy. No radiographically apparent displaced rib fractures. Visualized upper  abdomen is unremarkable. No focal airspace opacity. Right-sided central venous catheter with the tip in the lower SVC. Degenerative changes of the bilateral glenohumeral joints. IMPRESSION: No focal airspace opacity Electronically Signed   By: Lorenza Cambridge M.D.   On: 09/05/2022 13:30    Cardiac Studies    CATH: 05/23/2022   Prox LAD to Mid LAD lesion is 100% stenosed.   Dist LAD-1 lesion is 70% stenosed.   Dist LAD-2 lesion is 70% stenosed.   Mid LAD lesion is 60% stenosed.   Ost Cx to Prox Cx lesion is 99% stenosed.   Prox Cx to Mid Cx lesion is 70% stenosed.   Ramus-1 lesion is 60% stenosed.   Ramus-2 lesion is 20% stenosed.   Ramus-3 lesion is 70% stenosed.   RPAV lesion is 60% stenosed.   Non-stenotic Mid LAD to Dist LAD lesion was previously treated.   LIMA and is normal in caliber.   RIMA and is normal in caliber.   The graft exhibits no disease.   Severe double vessel CAD Chronic occlusion of ostial LAD stent. The proximal, mid and distal LAD and Diagonal branches fill from the LIMA graft. The ostial Circumflex stent has severe restenosis, unchanged from last cath. Patent RIMA graft that fills the Circumflex Large dominant RCA with mild non-obstructive disease.  PCWP 24 mmHg, LVEDP 24 mmHg.    Recommendations: Continue medical management of CAD. She would benefit from diuresis. Will give one dose of IV Lasix today.          Patient Profile     Jennifer Hogan is a 52 y.o. female with a hx of  HFrEF (EF 45-50% on Dobuta 3), CAD status post coronary artery bypass and graft (LIMA to the LAD/ramus intermedius and RIMA to the obtuse marginal), DM Type I, prior DVT, anemia, anxiety, hypothyroidism, and hyperlipidemia who was seen 09/05/2022 for the evaluation of NSTEMI iso c/f DKA.   Assessment & Plan    NSTEMI: Demand ischemia.  Patient has recently developed recurrent episodes of abdominal pain nausea vomiting leading to ultimate significant dehydration.  She has history of gastroparesis and colonic dysplasia.  On May 23, 2022 she had experienced a similar episode and had undergone repeat cardiac catheterization.  Data is shown above.  She was found to have a patent LIMA graft to her LAD and a patent RIMA graft to her left circumflex vessel.  Her RCA has minimal disease.  There is diffuse disease in a very distal LAD which is very small caliber and not amenable to intervention.  She denied any associated chest tightness or pressure yesterday prior to presenting to [facility].  Suspect troponin increase is due to demand ischemia and not ACS.  At this point, I do not feel repeat cardiac catheterization is indicated. History of DVT, in left jugular vein in May 2024; currently on Eliquis.  This has been placed on hold pending decision regarding catheterization.  She is on IV heparin.  I suspect Eliquis can be reinstituted if catheterization is not to be performed. Cardiomyopathy: Remotely, patient had markedly reduced EF at 20 to 25% which subsequently had improved to 45 to 50% on TTE in April 2024.  He has been on home infusion of dobutamine, followed by Dr. Gala Romney.  On exam she is well compensated without overt heart failure. DKA: Patient has been treated with intravenous fluids, prior lactated Ringer's.  Following hydration, lactate has returned to normal. History of statin intolerance, on Praluent AKI:  Creatinine increased to  1.44.  Patient has been on losartan and spironolactone prior to admission which are on hold.   For questions or updates, please contact [facility] Please consult [email or web address] for contact info under        Signed, Nicki Guadalajara, MD  09/06/2022, 11:21 AM

## 2022-09-06 NOTE — Plan of Care (Signed)
  Problem: Education: Goal: Knowledge of General Education information will improve Description Including pain rating scale, medication(s)/side effects and non-pharmacologic comfort measures Outcome: Progressing   

## 2022-09-06 NOTE — Progress Notes (Signed)
Pt endorses continued N/V despite PRN reglan and zofran. Requesting phenergan for N/V and morphine for hiatal hernia related pain. [facility] notified. Awaiting further orders.

## 2022-09-06 NOTE — Progress Notes (Signed)
[facility] notified: pt requests PRN reglan be changed from PO to IV d/t N/V  Order changed, IV reglan given, see eMAR

## 2022-09-06 NOTE — Plan of Care (Signed)
  Problem: Education: Goal: Knowledge of General Education information will improve Description Including pain rating scale, medication(s)/side effects and non-pharmacologic comfort measures Outcome: Progressing   Problem: Nutrition: Goal: Adequate nutrition will be maintained Outcome: Progressing   Problem: Pain Managment: Goal: General experience of comfort will improve Outcome: Progressing

## 2022-09-06 NOTE — Progress Notes (Signed)
ANTICOAGULATION CONSULT NOTE  Pharmacy Consult for heparin Indication: chest pain/ACS  Allergies  Allergen Reactions   Crestor [Rosuvastatin Calcium] Other (See Comments)    Myalgias  Arthralgias    Lipitor [Atorvastatin] Other (See Comments)    Myalgias    Monosodium Glutamate Nausea And Vomiting and Other (See Comments)    MSG - migraine   Zetia [Ezetimibe] Other (See Comments)    Myalgias    Ciprofloxin Hcl [Ciprofloxacin] Nausea And Vomiting and Other (See Comments)    Severe migraine   Food Color Red [Red Dye #40 (Allura Red)] Diarrhea    Patient Measurements: Height: 5\' 5"  (165.1 cm) Weight: 61.6 kg (135 lb 12.9 oz) IBW/kg (Calculated) : 57 Heparin Dosing Weight: 63.1kg  Vital Signs: Temp: 99.2 F (37.3 C) (08/14 1544) Temp Source: Oral (08/14 1544) BP: 180/110 (08/14 2004) Pulse Rate: 89 (08/14 1400)  Labs: Recent Labs    09/05/22 1221 09/05/22 1233 09/05/22 1422 09/05/22 2038 09/05/22 2058 09/06/22 0149 09/06/22 1123 09/06/22 1759  HGB 12.9 13.9  --   --   --  11.5*  --   --   HCT 37.3 41.0  --   --   --  34.2*  --   --   PLT 433*  --   --   --   --  355  --   --   APTT  --   --   --   --    < > 66* 57* 58*  HEPARINUNFRC  --   --   --  >1.10*  --   --   --   --   CREATININE 1.16*  --   --   --   --  1.44*  --   --   TROPONINIHS 1,217*  --  1,216*  --   --  1,484*  --   --    < > = values in this interval not displayed.    Estimated Creatinine Clearance: 40.7 mL/min (A) (by C-G formula based on SCr of 1.44 mg/dL (H)).  Assessment: 30 yof presented to the ED with constipation and CP. Troponin elevated and now starting IV heparin. Baseline CBC is WNL. She is on apixaban PTA due to a DVT in 05/2022. Her last dose of apixaban was 8/12 at ~11:30am.   8/14 1200: aPTT level 66 (borderline subtherapeutic) earlier this morning on 900 units/hr. Rechecked at 1100 and aPTT level was 54, subtherapeutic on heparin 900 units/hr. No bleeding or issues with the  infusion noted. CBC stable. No plans for catheterization at this time, but will continue heparin for history of DVT.  8/14 PM update: aPTT 58 seconds- subtherapeutic No signs of bleeding or issues with the infusion per RN   Goal of Therapy:  Heparin level 0.3-0.7 units/ml aPTT 66-103 seconds Monitor platelets by anticoagulation protocol: Yes   Plan:  Increase heparin gtt to 1150 units/hr Check aPTT and HL w/ AM labs Daily aPTT, heparin level and CBC Monitor for s/sx of bleeding  Thank you for involving pharmacy in this patient's care.  Greta Doom BS, PharmD, BCPS Clinical Pharmacist 09/06/2022 8:53 PM  Contact: [phone] after 3 PM  "Be curious, not judgmental..." -Debbora Dus

## 2022-09-06 NOTE — TOC Initial Note (Signed)
Transition of Care [facility]) - Initial/Assessment Note    Patient Details  Name: Jennifer Hogan MRN: [id number] Date of Birth: [birth year]  Transition of Care [facility]) CM/SW Contact:    Elliot Cousin, RN Phone Number: [phone] 09/06/2022, 1:54 PM  Clinical Narrative:    HF TOC CM spoke to pt and states she continues with [facility] for Home Dobutamine. Contacted [facility] and spoke to rep, [facility] # [phone] fax # [phone]. Pt has Medicare A&B and HH RN set up with [facility] for PICC line care at home. Pt was coming weekly to [facility] for PICC line care. Will need HH RN order for Home Dobutamine and PICC line care with [facility].             Expected Discharge Plan: Home w Home Health Services Barriers to Discharge: Continued Medical Work up   Patient Goals and CMS Choice Patient states their goals for this hospitalization and ongoing recovery are:: wants to remain independent CMS Medicare.gov Compare Post Acute Care list provided to:: Patient Choice offered to / list presented to : Patient      Expected Discharge Plan and Services   Discharge Planning Services: CM Consult Post Acute Care Choice: Home Health Living arrangements for the past 2 months: Single Family Home                             HH Agency: [facility] ([facility]) Date [facility] Agency Contacted: 09/06/22 Time HH Agency Contacted: 1345 Representative spoke with at [facility] Agency: Serina Cowper, [facility] rep, Cristal [facility] rep  Prior Living Arrangements/Services Living arrangements for the past 2 months: Single Family Home Lives with:: Self Patient language and need for interpreter reviewed:: Yes Do you feel safe going back to the place where you live?: Yes      Need for Family Participation in Patient Care: No (Comment) Care giver support system in place?: Yes (comment)   Criminal Activity/Legal Involvement Pertinent to Current  Situation/Hospitalization: No - Comment as needed  Activities of Daily Living      Permission Sought/Granted Permission sought to share information with : Case Manager, Family Supports, PCP Permission granted to share information with : Yes, Verbal Permission Granted  Share Information with NAME: Angela Nevin  Permission granted to share info w AGENCY: Home Health, [facility], Home Infusion  Permission granted to share info w Relationship: boyfriend  Permission granted to share info w Contact Information: [phone]  Emotional Assessment Appearance:: Appears stated age Attitude/Demeanor/Rapport: Engaged Affect (typically observed): Accepting Orientation: : Oriented to Self, Oriented to Place, Oriented to  Time, Oriented to Situation   Psych Involvement: No (comment)  Admission diagnosis:  NSTEMI (non-ST elevated myocardial infarction) [facility]) [I21.4] Patient Active Problem List   Diagnosis Date Noted   Nausea 09/05/2022   Uncontrolled diabetes mellitus with hyperglycemia (HCC) 06/28/2022   DVT (deep venous thrombosis) (HCC) 06/28/2022   Malnutrition of moderate degree 05/31/2022   DKA (diabetic ketoacidosis) (HCC) 05/21/2022   DKA, type 1 (HCC) 05/20/2022   Proctitis 03/23/2022   S/P CABG x 3 03/23/2022   Hiatal hernia 03/23/2022   Coronary artery disease due to lipid rich plaque 02/19/2022   Vitreous hemorrhage, right (HCC) 01/14/2022   MDD (major depressive disorder), recurrent episode, severe (HCC) 08/24/2021   GAD (generalized anxiety disorder) 08/24/2021   Acute hypoxic respiratory failure (HCC) 05/11/2021  DCM (dilated cardiomyopathy) (HCC)    Takotsubo cardiomyopathy    Acute systolic heart failure (HCC)    Postoperative seroma of ear after procedure on ear 12/12/2019   Cellulitis of auricle of left ear 12/01/2019   AKI (acute kidney injury) (HCC) 05/01/2019   Altered bowel habits 11/28/2018   Acute on chronic systolic heart failure (HCC)     Cardiomyopathy (HCC)    Moderate episode of recurrent major depressive disorder (HCC) 03/27/2018   Hyperglycemia due to type 1 diabetes mellitus (HCC) 02/14/2018   Hyperlipidemia LDL goal <70 02/14/2018   S/P drug eluting coronary stent placement    Non-ST elevation (NSTEMI) myocardial infarction (HCC) 02/08/2018   Marijuana abuse 12/31/2017   Gastroparesis 12/06/2017   Gastroparesis due to DM (HCC) 03/28/2017   Ischemic cardiomyopathy 03/28/2017   Pure hypercholesterolemia    NSTEMI (non-ST elevated myocardial infarction) (HCC)    Anemia 03/17/2017   Allergic state 08/07/2015   Vitiligo 07/09/2012   Type 1 diabetes mellitus (HCC) 07/09/2012   Depression 12/04/2010   Hypothyroidism 12/04/2010   PCP:  [name], NP Pharmacy:   [facility] [address], [address] [place] [address] Phone: [phone] Fax: [phone]  [facility] [address] [place] [address] Phone: [phone] Fax: [phone]  [id number] - [place], [place] - [address] [place] [address] Phone: [phone] Fax: [phone]     Social Determinants of Health (SDOH) Social History: SDOH Screenings   Food Insecurity: No Food Insecurity (05/26/2022)  Recent Concern: Food Insecurity - Food Insecurity Present (05/15/2022)  Housing: Low Risk  (05/26/2022)  Recent Concern: Housing - High Risk (05/15/2022)  Transportation Needs: No Transportation Needs (05/26/2022)  Recent Concern: Transportation Needs - Unmet Transportation Needs (05/15/2022)  Utilities: Not At Risk (05/26/2022)  Alcohol Screen: Low Risk  (10/05/2020)  Depression (PHQ2-9): Low Risk  (06/09/2022)  Financial Resource Strain: High Risk (05/15/2022)  Physical Activity: Unknown (05/15/2022)  Social Connections: Socially Isolated (05/15/2022)  Stress: Stress Concern Present (05/15/2022)  Tobacco Use: Medium Risk  (08/23/2022)   SDOH Interventions:     Readmission Risk Interventions    05/31/2022   11:59 AM 06/01/2021   10:05 AM  Readmission Risk Prevention Plan  Transportation Screening Complete Complete  PCP or Specialist Appt within 3-5 Days  --  HRI or Home Care Consult  Complete  Social Work Consult for Recovery Care Planning/Counseling  Complete  Palliative Care Screening  Not Applicable  Medication Review Oceanographer) Complete Complete  PCP or Specialist appointment within 3-5 days of discharge Not Complete   PCP/Specialist Appt Not Complete comments follow up 06/09/22   HRI or Home Care Consult Complete   SW Recovery Care/Counseling Consult Complete   Palliative Care Screening Not Applicable   Skilled Nursing Facility Not Applicable

## 2022-09-06 NOTE — Progress Notes (Signed)
eLink Physician-Brief Progress Note Patient Name: Jennifer Hogan DOB: November 07, 1970 MRN: [id number]   Date of Service  09/06/2022  HPI/Events of Note  Patient complaining of nausea and epigastric pain from reflux, she denies angina.  eICU Interventions  PRN Phenergan and Morphine ordered.        Migdalia Dk 09/06/2022, 8:53 PM

## 2022-09-06 NOTE — Plan of Care (Signed)

## 2022-09-06 NOTE — Progress Notes (Signed)
  Echocardiogram 2D Echocardiogram has been performed.  Delcie Roch 09/06/2022, 5:03 PM

## 2022-09-06 NOTE — Inpatient Diabetes Management (Signed)
Inpatient Diabetes Program Recommendations  AACE/ADA: New Consensus Statement on Inpatient Glycemic Control (2015)  Target Ranges:  Prepandial:   less than 140 mg/dL      Peak postprandial:   less than 180 mg/dL (1-2 hours)      Critically ill patients:  140 - 180 mg/dL   Lab Results  Component Value Date   GLUCAP 147 (H) 09/06/2022   HGBA1C 7.2 (A) 08/23/2022    Latest Reference Range & Units 09/05/22 11:59 09/05/22 17:11 09/05/22 21:06 09/05/22 23:08 09/06/22 03:16 09/06/22 07:27  Glucose-Capillary 70 - 99 mg/dL 161 (H) 096 (H) 045 (H) 188 (H) 240 (H) 147 (H)  (H): Data is abnormally high   Diabetes history: DM 1 Outpatient Diabetes medications:  Omnipod insulin pump See below  Current orders for Inpatient glycemic control:  Insulin pump order set   Inpatient Diabetes Program Recommendations:   Agree with patient managing current insulin Omnipod pump. Will follow during hospitalization.  Per Dr. Elvera Lennox office visit on 05/03/22: " Patient Instructions  Please use the following pump settings: - basal rates: 12 am: 0.550 >> 0.500 units/h 6 am: 0.625 10 am: 0.600 >> 0.625 10 pm: 0.650 >> 0.625 - ICR: 1:17 >> 1:15 - target: 100-120 - ISF: 75 (may need to change to 65-70) - Insulin on Board: 4h   Try to start the Omnipod 5.  Thank you, Billy Fischer. , RN, MSN, CDE  Diabetes Coordinator Inpatient Glycemic Control Team Team Pager [phone] (8am-5pm) 09/06/2022 9:35 AM

## 2022-09-06 NOTE — Inpatient Diabetes Management (Addendum)
Inpatient Diabetes Program Recommendations  AACE/ADA: New Consensus Statement on Inpatient Glycemic Control (2015)  Target Ranges:  Prepandial:   less than 140 mg/dL      Peak postprandial:   less than 180 mg/dL (1-2 hours)      Critically ill patients:  140 - 180 mg/dL   Lab Results  Component Value Date   GLUCAP 147 (H) 09/06/2022   HGBA1C 7.2 (A) 08/23/2022  Met with patient @ bedside. Patient is currently wearing her Omnipod insulin pump on her right thigh and needs to change out today. Patient has a friend that is bringing her the supplies including a Dexcom 6 sensor and transmitter. I gave patient Dexcom 7 sensor which she placed on her left upper arm to be able to read glucose readings between CBG checks until she receives supplies this evening.  Thank you, Billy Fischer. , RN, MSN, CDE  Diabetes Coordinator Inpatient Glycemic Control Team Team Pager [phone] (8am-5pm) 09/06/2022 9:39 AM

## 2022-09-07 ENCOUNTER — Inpatient Hospital Stay (HOSPITAL_COMMUNITY)
Admission: RE | Admit: 2022-09-07 | Discharge: 2022-09-07 | Disposition: A | Discharge status: Home / Self Care | Payer: Medicare Other | Source: Ambulatory Visit | Attending: Cardiology | Admitting: Cardiology

## 2022-09-07 DIAGNOSIS — I214 Non-ST elevation (NSTEMI) myocardial infarction: Secondary | ICD-10-CM | POA: Diagnosis not present

## 2022-09-07 DIAGNOSIS — R7989 Other specified abnormal findings of blood chemistry: Secondary | ICD-10-CM | POA: Diagnosis not present

## 2022-09-07 DIAGNOSIS — N179 Acute kidney failure, unspecified: Secondary | ICD-10-CM | POA: Diagnosis not present

## 2022-09-07 DIAGNOSIS — R110 Nausea: Secondary | ICD-10-CM | POA: Diagnosis not present

## 2022-09-07 LAB — BASIC METABOLIC PANEL
Anion gap: 12 (ref 5–15)
BUN: 16 mg/dL (ref 6–20)
CO2: 22 mmol/L (ref 22–32)
Calcium: 9 mg/dL (ref 8.9–10.3)
Chloride: 101 mmol/L (ref 98–111)
Creatinine, Ser: 1.16 mg/dL — ABNORMAL HIGH (ref 0.44–1.00)
GFR, Estimated: 56 mL/min — ABNORMAL LOW (ref >60)
Glucose, Bld: 209 mg/dL — ABNORMAL HIGH (ref 70–99)
Potassium: 4.2 mmol/L (ref 3.5–5.1)
Sodium: 135 mmol/L (ref 135–145)

## 2022-09-07 LAB — GLUCOSE, CAPILLARY
Glucose-Capillary: 256 mg/dL — ABNORMAL HIGH (ref 70–99)
Glucose-Capillary: 237 mg/dL — ABNORMAL HIGH (ref 70–99)
Glucose-Capillary: 211 mg/dL — ABNORMAL HIGH (ref 70–99)
Glucose-Capillary: 255 mg/dL — ABNORMAL HIGH (ref 70–99)
Glucose-Capillary: 203 mg/dL — ABNORMAL HIGH (ref 70–99)

## 2022-09-07 LAB — CBC
HCT: 36.5 % (ref 36.0–46.0)
Hemoglobin: 12.1 g/dL (ref 12.0–15.0)
MCH: 29.8 pg (ref 26.0–34.0)
MCHC: 33.2 g/dL (ref 30.0–36.0)
MCV: 89.9 fL (ref 80.0–100.0)
Platelets: 375 10*3/uL (ref 150–400)
RBC: 4.06 MIL/uL (ref 3.87–5.11)
RDW: 16 % — ABNORMAL HIGH (ref 11.5–15.5)
WBC: 9.7 10*3/uL (ref 4.0–10.5)
nRBC: 0 % (ref 0.0–0.2)

## 2022-09-07 LAB — APTT
aPTT: 68 s — ABNORMAL HIGH (ref 24–36)
aPTT: 76 seconds — ABNORMAL HIGH (ref 24–36)

## 2022-09-07 LAB — PROCALCITONIN: Procalcitonin: <0.1 ng/mL

## 2022-09-07 LAB — HEPARIN LEVEL (UNFRACTIONATED): Heparin Unfractionated: >1.1 [IU]/mL — ABNORMAL HIGH (ref 0.30–0.70)

## 2022-09-07 MED ORDER — SODIUM CHLORIDE 0.9% FLUSH
10.0000 mL | Freq: Two times a day (BID) | INTRAVENOUS | Status: DC
  Administered 2022-09-07: 30 mL
  Administered 2022-09-07: 10 mL
  Administered 2022-09-08: 20 mL
  Administered 2022-09-08: 10 mL

## 2022-09-07 MED ORDER — SODIUM CHLORIDE 0.9 % IV SOLN
12.5000 mg | Freq: Four times a day (QID) | INTRAVENOUS | Status: DC | PRN
  Administered 2022-09-07 (×2): 12.5 mg via INTRAVENOUS
  Filled 2022-09-07 (×3): qty 0.5
  Filled 2022-09-07: qty 12.5

## 2022-09-07 MED ORDER — SODIUM CHLORIDE 0.9% FLUSH: 10.0000 mL | INTRAVENOUS | Status: DC | PRN

## 2022-09-07 MED ORDER — SENNA 8.6 MG PO TABS: 1.0000 | ORAL_TABLET | Freq: Every evening | ORAL | Status: DC | PRN

## 2022-09-07 MED ORDER — DOBUTAMINE-DEXTROSE 4-5 MG/ML-% IV SOLN
1.5000 ug/kg/min | INTRAVENOUS | Status: DC
  Administered 2022-09-07: 1.5 ug/kg/min via INTRAVENOUS
  Filled 2022-09-07: qty 250

## 2022-09-07 NOTE — Progress Notes (Addendum)
Rounding Note    Patient Name: Jennifer Hogan Date of Encounter: 09/07/2022  [facility] Cardiologist: Armanda Magic, MD   Subjective   N/v is better. No chest pain. Says she is feeling much better, but still not wanting to eat.   Inpatient Medications    Scheduled Meds:  aspirin EC  81 mg Oral Daily   buPROPion  150 mg Oral Daily   Chlorhexidine Gluconate Cloth  6 each Topical Daily   digoxin  0.125 mg Oral Daily   insulin pump   Subcutaneous TID WC, HS, 0200   levothyroxine  100 mcg Oral QAC breakfast   LORazepam  0.5 mg Oral QHS   mometasone-formoterol  2 puff Inhalation BID   montelukast  10 mg Oral QHS   pantoprazole  40 mg Oral BID   sodium chloride flush  10-40 mL Intracatheter Q12H   Continuous Infusions:  sodium chloride 10 mL/hr at 09/07/22 0700   DOBUTamine 1.5 mcg/kg/min (09/07/22 0700)   heparin 1,150 Units/hr (09/07/22 0700)   PRN Meds: sodium chloride, metoCLOPramide (REGLAN) injection, morphine injection, ondansetron (ZOFRAN) IV, ondansetron, mouth rinse, polyethylene glycol, senna, sodium chloride flush   Vital Signs    Vitals:   09/07/22 0400 09/07/22 0615 09/07/22 0816 09/07/22 0912  BP: (!) 152/92   110/71  Pulse: (!) 137 (!) 119  (!) 110  Resp: 17 (!) 21  18  Temp: 99 F (37.2 C)   98.7 F (37.1 C)  TempSrc: Oral   Oral  SpO2: 100% 98% 100% 100%  Weight: 61 kg     Height:        Intake/Output Summary (Last 24 hours) at 09/07/2022 1044 Last data filed at 09/07/2022 0700 Gross per 24 hour  Intake 691.94 ml  Output --  Net 691.94 ml      09/07/2022    4:00 AM 09/06/2022   10:37 PM 09/05/2022   10:15 PM  Last 3 Weights  Weight (lbs) 134 lb 7.7 oz 136 lb 0.4 oz 135 lb 12.9 oz  Weight (kg) 61 kg 61.7 kg 61.6 kg      Telemetry    Sinus  Tachycardia, short run of NSVT- Personally Reviewed  Physical Exam   GEN: No acute distress.   Neck: No JVD Cardiac: tachy, soft systolic murmur, no rubs, or gallops.  Respiratory:  Clear to auscultation bilaterally. GI: Soft, nontender, non-distended  MS: No edema; No deformity. Neuro:  Nonfocal  Psych: Normal affect   Labs    High Sensitivity Troponin:   Recent Labs  Lab 09/05/22 1221 09/05/22 1422 09/06/22 0149  TROPONINIHS 1,217* 1,216* 1,484*     Chemistry Recent Labs  Lab 08/31/22 1256 09/05/22 1221 09/05/22 1233 09/06/22 0149 09/07/22 0830  NA 137 134* 136 135 135  K 4.0 3.7 3.7 3.6 4.2  CL 104 98  --  99 101  CO2 21* 21*  --  19* 22  GLUCOSE 148* 173*  --  217* 209*  BUN 14 23*  --  24* 16  CREATININE 0.98 1.16*  --  1.44* 1.16*  CALCIUM 8.9 10.6*  --  9.3 9.0  MG 2.0  --   --  2.3  --   PROT 6.7 8.8*  --   --   --   ALBUMIN 3.6 4.9  --   --   --   AST 24 33  --   --   --   ALT 21 25  --   --   --  ALKPHOS 76 105  --   --   --   BILITOT <0.1* 0.7  --   --   --   GFRNONAA >60 56*  --  43* 56*  ANIONGAP 12 15  --  17* 12    Lipids No results for input(s): "CHOL", "TRIG", "HDL", "LABVLDL", "LDLCALC", "CHOLHDL" in the last 168 hours.  Hematology Recent Labs  Lab 09/05/22 1221 09/05/22 1233 09/06/22 0149 09/07/22 0451  WBC 13.4*  --  10.5 9.7  RBC 4.28  --  3.84* 4.06  HGB 12.9 13.9 11.5* 12.1  HCT 37.3 41.0 34.2* 36.5  MCV 87.1  --  89.1 89.9  MCH 30.1  --  29.9 29.8  MCHC 34.6  --  33.6 33.2  RDW 15.9*  --  16.5* 16.0*  PLT 433*  --  355 375   Thyroid No results for input(s): "TSH", "FREET4" in the last 168 hours.  BNPNo results for input(s): "BNP", "PROBNP" in the last 168 hours.  DDimer No results for input(s): "DDIMER" in the last 168 hours.   Radiology    CT ABDOMEN PELVIS W CONTRAST  Result Date: 09/05/2022 CLINICAL DATA:  Constipation, vomiting EXAM: CT ABDOMEN AND PELVIS WITH CONTRAST TECHNIQUE: Multidetector CT imaging of the abdomen and pelvis was performed using the standard protocol following bolus administration of intravenous contrast. RADIATION DOSE REDUCTION: This exam was performed according to the  departmental dose-optimization program which includes automated exposure control, adjustment of the mA and/or kV according to patient size and/or use of iterative reconstruction technique. CONTRAST:  70mL OMNIPAQUE IOHEXOL 300 MG/ML  SOLN COMPARISON:  05/20/2022 FINDINGS: Lower chest: Small linear densities are seen in left lower lung field, possibly scarring. Hepatobiliary: No focal abnormalities are seen in liver. Gallbladder is unremarkable. There is no dilation of bile ducts. Pancreas: No focal abnormalities are seen. Spleen: Unremarkable. Adrenals/Urinary Tract: Adrenals are unremarkable. There is no hydronephrosis. There are no renal or ureteral stones. There is a 3 mm low-density in the lower pole of right kidney, possibly a cyst. Urinary bladder is unremarkable. Stomach/Bowel: Small hiatal hernia is seen. Small bowel loops are not dilated. Appendix is not seen. There is no pericecal inflammation. There is no significant wall thickening in colon. There is no pericolic stranding. Vascular/Lymphatic: Scattered arterial calcifications are seen in aorta and its major branches. Reproductive: Unremarkable. Other: There is no ascites or pneumoperitoneum. Musculoskeletal: Degenerative changes are noted in lumbar spine, most severe at L3-L4 and L4-L5 levels with spinal stenosis and encroachment of neural foramina. No interval changes are noted. IMPRESSION: No acute findings are seen in CT scan of abdomen and pelvis. There is no evidence of intestinal obstruction or pneumoperitoneum. There is no hydronephrosis. Small hiatal hernia. Tiny right renal cyst. Aortic arteriosclerosis. Lumbar spondylosis. Electronically Signed   By: Ernie Avena M.D.   On: 09/05/2022 15:30   DG Chest Port 1 View  Result Date: 09/05/2022 CLINICAL DATA:  Chest discomfort EXAM: PORTABLE CHEST 1 VIEW COMPARISON:  CXR 06/27/22 FINDINGS: No pleural effusion. No pneumothorax. Status post median sternotomy. No radiographically apparent  displaced rib fractures. Visualized upper abdomen is unremarkable. No focal airspace opacity. Right-sided central venous catheter with the tip in the lower SVC. Degenerative changes of the bilateral glenohumeral joints. IMPRESSION: No focal airspace opacity Electronically Signed   By: Lorenza Cambridge M.D.   On: 09/05/2022 13:30    Cardiac Studies   Echo: 09/06/2022  IMPRESSIONS     1. Severe hypokinesis of the inferior and septal walls  with overall low  normal LV function.   2. Left ventricular ejection fraction, by estimation, is 50 to 55%. The  left ventricle has low normal function. The left ventricle demonstrates  regional wall motion abnormalities (see scoring diagram/findings for  description). Left ventricular diastolic   parameters are consistent with Grade II diastolic dysfunction  (pseudonormalization).   3. Right ventricular systolic function is normal. The right ventricular  size is normal.   4. The mitral valve is normal in structure. No evidence of mitral valve  regurgitation. No evidence of mitral stenosis.   5. The aortic valve is tricuspid. Aortic valve regurgitation is not  visualized. No aortic stenosis is present.   6. The inferior vena cava is normal in size with greater than 50%  respiratory variability, suggesting right atrial pressure of 3 mmHg.   FINDINGS   Left Ventricle: Left ventricular ejection fraction, by estimation, is 50  to 55%. The left ventricle has low normal function. The left ventricle  demonstrates regional wall motion abnormalities. The left ventricular  internal cavity size was normal in  size. There is no left ventricular hypertrophy. Left ventricular diastolic  parameters are consistent with Grade II diastolic dysfunction  (pseudonormalization).   Right Ventricle: The right ventricular size is normal. No increase in  right ventricular wall thickness. Right ventricular systolic function is  normal.   Left Atrium: Left atrial size was  normal in size.   Right Atrium: Right atrial size was normal in size.   Pericardium: There is no evidence of pericardial effusion.   Mitral Valve: The mitral valve is normal in structure. No evidence of  mitral valve regurgitation. No evidence of mitral valve stenosis.   Tricuspid Valve: The tricuspid valve is normal in structure. Tricuspid  valve regurgitation is trivial. No evidence of tricuspid stenosis.   Aortic Valve: The aortic valve is tricuspid. Aortic valve regurgitation is  not visualized. No aortic stenosis is present.   Pulmonic Valve: The pulmonic valve was normal in structure. Pulmonic valve  regurgitation is not visualized. No evidence of pulmonic stenosis.   Aorta: The aortic root is normal in size and structure.   Venous: The inferior vena cava is normal in size with greater than 50%  respiratory variability, suggesting right atrial pressure of 3 mmHg.   IAS/Shunts: No atrial level shunt detected by color flow Doppler.   Additional Comments: Severe hypokinesis of the inferior and septal walls  with overall low normal LV function.    Patient Profile     52 y.o. female with a hx of  HFrEF (EF 45-50% on Dobuta 3), CAD status post coronary artery bypass and graft (LIMA to the LAD/ramus intermedius and RIMA to the obtuse marginal), DM Type I, prior DVT, anemia, anxiety, hypothyroidism, and hyperlipidemia who was seen 09/05/2022 for the evaluation of NSTEMI iso c/f DKA.   Assessment & Plan    NSTEMI CAD s/p 3v CABG (LIMA to the LAD/ramus intermedius and RIMA to the obtuse marginal) -- presented with abd pain, n/v for several days prior to admission. She had recent admission 04/2022 with similar episode with elevated troponins. Underwent cardiac cath and found to have patent LIMA to LAD/RI, RIMA to LCx and RCA with minimal disease. Treated medically. hsTn 1217>>1216>>1484 this admission. Denies any chest pain. It is no felt that she needs repeat cardiac cath this  admission.  -- continue ASA  HFrEF hx of low output -- remotely reduced LVEF of 20-25% which improved to 45-50% on last outpatient echo.  Has been followed in the AHF clinic with Dr. Gala Romney on home dobutamine.  -- repeat echo this admission shows further improvement with LVEF of 50-55%, g2DD, normal RV function  -- dobutamine was decreased from 102mcg/kg to 1.59mcg/kg overnight with episodes of hypertension and tachycardia -- she remains well compensated on exam -- remains on dig 0.125mg  daily, recent dig level 8/13 low at 0.5  -- will touch base with AHF regarding management  Addendum: Spoke with AHF, ok to pull PICC and stop dobutamine> Per primary team there is concern for possible line infection.  Type 1 DM DKA -- several recurrent admissions, trouble with insulin pump in the past -- treated with IVFs, lactic acid 2.4>>2.1>>1.2  AKI -- Cr 1.16>>1.4>>1.16, resolved with IVFs  Hx of DVT -- on Eliquis PTA, currently on IV heparin. Would plan to transition back to Eliquis with no plans for cardiac cath   For questions or updates, please contact [facility] Please consult [email or web address] for contact info under        Signed, Laverda Page, NP  09/07/2022, 10:44 AM      Patient seen and examined. Agree with assessment and plan.  Recent cardiac catheterization May 23, 2022 patent LIMA graft to LAD and patent vein graft to left circumflex.  There was minimal disease in the RCA and she had diffuse disease beyond the LIMA graft in the very distal LAD, small caliber not amenable to intervention.  Troponin increased due to demand ischemia and not acute coronary syndrome. No chest pain or dyspnea. Can reinstitute Eliquis for anticoagulation history of remote DVT in left jugular.  Plan to pull PICC due to concern for possible line infection and stop dobutamine.  Will need to monitor blood pressure.  Creatinine improved today to 1.16.   Lennette Bihari, MD,  [facility] 09/07/2022 1:07 PM

## 2022-09-07 NOTE — Progress Notes (Signed)
Received call from CCMD notified pt had 7 beats of SVT. Pt asymptomatic. Will continue to monitor the pt.   Lawson Radar, RN

## 2022-09-07 NOTE — Progress Notes (Signed)
eLink Physician-Brief Progress Note Patient Name: Jennifer Hogan DOB: 09-15-1970 MRN: [id number]   Date of Service  09/07/2022  HPI/Events of Note  Patient with hypertension and tachycardia on Dobutamine at 3 mcg/kg/ min. (SBP 160, HR 120 - 130's)  eICU Interventions  Dobutamine gtt rate reduced to 1.5 mcg/kg/min.         U  09/07/2022, 1:14 AM

## 2022-09-07 NOTE — Progress Notes (Signed)
ANTICOAGULATION CONSULT NOTE- Follow Up  Pharmacy Consult for heparin Indication: chest pain/ACS  Allergies  Allergen Reactions   Crestor [Rosuvastatin Calcium] Other (See Comments)    Myalgias  Arthralgias    Lipitor [Atorvastatin] Other (See Comments)    Myalgias    Monosodium Glutamate Nausea And Vomiting and Other (See Comments)    MSG - migraine   Zetia [Ezetimibe] Other (See Comments)    Myalgias    Ciprofloxin Hcl [Ciprofloxacin] Nausea And Vomiting and Other (See Comments)    Severe migraine   Food Color Red [Red Dye #40 (Allura Red)] Diarrhea    Patient Measurements: Height: 5\' 5"  (165.1 cm) Weight: 61 kg (134 lb 7.7 oz) IBW/kg (Calculated) : 57 Heparin Dosing Weight: 63.1kg  Vital Signs: Temp: 99 F (37.2 C) (08/15 1124) Temp Source: Oral (08/15 1124) BP: 100/71 (08/15 1124) Pulse Rate: 109 (08/15 1124)  Labs: Recent Labs    09/05/22 1221 09/05/22 1233 09/05/22 1422 09/05/22 2038 09/05/22 2058 09/06/22 0149 09/06/22 1123 09/06/22 1759 09/07/22 0451 09/07/22 0830 09/07/22 1100  HGB 12.9 13.9  --   --   --  11.5*  --   --  12.1  --   --   HCT 37.3 41.0  --   --   --  34.2*  --   --  36.5  --   --   PLT 433*  --   --   --   --  355  --   --  375  --   --   APTT  --   --   --   --    < > 66*   < > 58* 68*  --  76*  HEPARINUNFRC  --   --   --  >1.10*  --   --   --   --  >1.10*  --   --   CREATININE 1.16*  --   --   --   --  1.44*  --   --   --  1.16*  --   TROPONINIHS 1,217*  --  1,216*  --   --  1,484*  --   --   --   --   --    < > = values in this interval not displayed.    Estimated Creatinine Clearance: 50.5 mL/min (A) (by C-G formula based on SCr of 1.16 mg/dL (H)).  Assessment: 65 yof presented to the ED with constipation and CP. Troponin elevated and now starting IV heparin. Baseline CBC is WNL. She is on apixaban PTA due to a DVT in 05/2022. Her last dose of apixaban was 8/12 at ~11:30am.  Pharmacy consulted for heparin.  aPTT 76 is  therapeutic on 1150 units/hr. Heparin is not correlating yet. CBC stable.   Goal of Therapy:  Heparin level 0.3-0.7 units/ml aPTT 66-103 seconds Monitor platelets by anticoagulation protocol: Yes   Plan:  Continue heparin gtt at 1150 units/hr Daily aPTT, heparin level until levels correlate and CBC Monitor for s/sx of bleeding  Thank you for involving pharmacy in this patient's care.  Alphia Moh, PharmD, BCPS, BCCP Clinical Pharmacist  Please check AMION for all [facility] phone numbers After 10:00 PM, call Main Pharmacy [phone]

## 2022-09-07 NOTE — Progress Notes (Signed)
ANTICOAGULATION CONSULT NOTE- Follow Up  Pharmacy Consult for heparin Indication: chest pain/ACS  Allergies  Allergen Reactions   Crestor [Rosuvastatin Calcium] Other (See Comments)    Myalgias  Arthralgias    Lipitor [Atorvastatin] Other (See Comments)    Myalgias    Monosodium Glutamate Nausea And Vomiting and Other (See Comments)    MSG - migraine   Zetia [Ezetimibe] Other (See Comments)    Myalgias    Ciprofloxin Hcl [Ciprofloxacin] Nausea And Vomiting and Other (See Comments)    Severe migraine   Food Color Red [Red Dye #40 (Allura Red)] Diarrhea    Patient Measurements: Height: 5\' 5"  (165.1 cm) Weight: 61 kg (134 lb 7.7 oz) IBW/kg (Calculated) : 57 Heparin Dosing Weight: 63.1kg  Vital Signs: Temp: 99 F (37.2 C) (08/15 0400) Temp Source: Oral (08/15 0400) BP: 152/92 (08/15 0400) Pulse Rate: 137 (08/15 0400)  Labs: Recent Labs    09/05/22 1221 09/05/22 1233 09/05/22 1422 09/05/22 2038 09/05/22 2058 09/06/22 0149 09/06/22 1123 09/06/22 1759 09/07/22 0451  HGB 12.9 13.9  --   --   --  11.5*  --   --  12.1  HCT 37.3 41.0  --   --   --  34.2*  --   --  36.5  PLT 433*  --   --   --   --  355  --   --  375  APTT  --   --   --   --    < > 66* 57* 58* 68*  HEPARINUNFRC  --   --   --  >1.10*  --   --   --   --  >1.10*  CREATININE 1.16*  --   --   --   --  1.44*  --   --   --   TROPONINIHS 1,217*  --  1,216*  --   --  1,484*  --   --   --    < > = values in this interval not displayed.    Estimated Creatinine Clearance: 40.7 mL/min (A) (by C-G formula based on SCr of 1.44 mg/dL (H)).  Assessment: 66 yof presented to the ED with constipation and CP. Troponin elevated and now starting IV heparin. Baseline CBC is WNL. She is on apixaban PTA due to a DVT in 05/2022. Her last dose of apixaban was 8/12 at ~11:30am.   8/14 1200: aPTT level 66 (borderline subtherapeutic) earlier this morning on 900 units/hr. Rechecked at 1100 and aPTT level was 54, subtherapeutic on  heparin 900 units/hr. No bleeding or issues with the infusion noted. CBC stable. No plans for catheterization at this time, but will continue heparin for history of DVT.  8/15 AM update: aPTT 68 seconds- therapeutic No signs of bleeding or issues with the infusion per RN   Goal of Therapy:  Heparin level 0.3-0.7 units/ml aPTT 66-103 seconds Monitor platelets by anticoagulation protocol: Yes   Plan:  Continue heparin gtt at 1150 units/hr Check confirmatory aPTT  Daily aPTT, heparin level until levels correlate and CBC Monitor for s/sx of bleeding  Thank you for involving pharmacy in this patient's care.  Arabella Merles, PharmD. Clinical Pharmacist 09/07/2022 6:14 AM

## 2022-09-07 NOTE — Inpatient Diabetes Management (Signed)
Inpatient Diabetes Program Recommendations  AACE/ADA: New Consensus Statement on Inpatient Glycemic Control (2015)  Target Ranges:  Prepandial:   less than 140 mg/dL      Peak postprandial:   less than 180 mg/dL (1-2 hours)      Critically ill patients:  140 - 180 mg/dL   Lab Results  Component Value Date   GLUCAP 211 (H) 09/07/2022   HGBA1C 7.2 (A) 08/23/2022    Diabetes history: DM 1 Outpatient Diabetes medications:  Omnipod insulin pump - basal rates: 12 am: 0.500  6 am: 0.625 10 am: 0.625 10 pm: 0.625 - ICR: 1:15 - target: 100-120 - ISF: 75  - Insulin on Board: 4h  Current orders for Inpatient glycemic control:  Insulin pump order set  Consult: pt on insulin pump still having hyperglycemia  Inpatient Diabetes Program Recommendations:    Maintain insulin pump currently  Note: Pt is very familiar to our team and is very savvy in operating insulin pump and maintaining glucose levels at home. Pt newly placed pump on yesterday. Pt reports the pump works in [employer] and needs a little while longer for more data to titrate insulin levels better. Pt reports the trends should come down today. Will monitor trends today and reevaluate tomorrow.   Thanks,  Christena Deem RN, MSN, BC-ADM Inpatient Diabetes Coordinator Team Pager [phone] (8a-5p)

## 2022-09-07 NOTE — Progress Notes (Signed)
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 service today  Subjective: -Had some nausea and vomiting yesterday  Assessment and Plan:  NSTEMI versus demand ischemia -Troponin peaked at 1484 -Cards following, currently on IV heparin -Echo noted EF of 50-55% with grade 2 diastolic dysfunction and hypokinesis of inferior and septal wall -Last cath 4/24 with significant multivessel disease, medical management was recommended then  Nausea, vomiting, chills -No fevers or leukocytosis, however will check blood cultures as she has a port, and PCT -Flu and COVID PCR were negative -Abdominal exam is benign  Chronic systolic CHF, ischemic cardiomyopathy -Previously felt to be end-stage on dobutamine 3 mcg, EF was 20-25% in April, EF recovered to 45-50% on last echo 08/17/2022 -Cards following -Discussed with heart failure team this morning, Dr.Bensimhon recommended to discontinue Dobutamine,  -remove port in 1-2 days if stable off DBA and sooner if cultures positive -Continue digoxin  Type 1 diabetes mellitus Hyperglycemia -Unclear if her pump is functioning well -Consult diabetes coordinator -Anion gap was mildly elevated initially, now has normalized  Mild AKI -Resolved with hydration  History of DVT -Previously on Eliquis, currently on IV heparin  DVT prophylaxis: IV  heparin Code Status: Full code Family Communication: None present at Disposition Plan: Home 48 hours if stable  Consultants:    Procedures:   Antimicrobials:    Objective: Vitals:   09/07/22 0400 09/07/22 0615 09/07/22 0816 09/07/22 0912  BP: (!) 152/92   110/71  Pulse: (!) 137 (!) 119  (!) 110  Resp: 17 (!) 21  18  Temp: 99 F (37.2 C)   98.7 F (37.1 C)  TempSrc: Oral   Oral  SpO2: 100% 98% 100% 100%  Weight: 61 kg     Height:        Intake/Output Summary (Last 24 hours) at 09/07/2022 1113 Last data filed at 09/07/2022 0700 Gross per 24 hour  Intake 691.94 ml  Output --  Net 691.94 ml   Filed Weights   09/05/22 2215 09/06/22 2237 09/07/22 0400  Weight: 61.6 kg 61.7 kg 61 kg    Examination:  General exam: Appears calm and comfortable  Respiratory system: Clear to auscultation HEENT: Right IJ port noted Cardiovascular system: S1 & S2 heard, RRR.  Abd: nondistended, soft and nontender.Normal bowel sounds heard. Central nervous system: Alert and oriented. No focal neurological deficits. Extremities: no edema Skin: No rashes Psychiatry:  Mood & affect appropriate.     Data Reviewed:   CBC: Recent Labs  Lab 08/31/22 1256 09/05/22 1221 09/05/22 1233 09/06/22 0149 09/07/22 0451  WBC 4.8 13.4*  --  10.5 9.7  HGB 10.9* 12.9 13.9 11.5* 12.1  HCT 33.3* 37.3 41.0 34.2* 36.5  MCV 91.5 87.1  --  89.1 89.9  PLT 361 433*  --  355 375   Basic Metabolic Panel: Recent Labs  Lab 08/31/22 1256 09/05/22 1221 09/05/22 1233 09/06/22 0149 09/07/22 0830  NA 137 134*  136 135 135  K 4.0 3.7 3.7 3.6 4.2  CL 104 98  --  99 101  CO2 21* 21*  --  19* 22  GLUCOSE 148* 173*  --  217* 209*  BUN 14 23*  --  24* 16  CREATININE 0.98 1.16*  --  1.44* 1.16*  CALCIUM 8.9 10.6*  --  9.3 9.0  MG 2.0  --   --  2.3  --   PHOS  --   --   --  6.1*  --    GFR: Estimated Creatinine Clearance: 50.5 mL/min (A) (by C-G formula based on SCr of 1.16 mg/dL (H)). Liver Function  Tests: Recent Labs  Lab 08/31/22 1256 09/05/22 1221  AST 24 33  ALT 21 25  ALKPHOS 76 105  BILITOT <0.1* 0.7  PROT 6.7 8.8*  ALBUMIN 3.6 4.9   Recent Labs  Lab 09/05/22 1221  LIPASE <10*   No results for input(s): "AMMONIA" in the last 168 hours. Coagulation Profile: No results for input(s): "INR", "PROTIME" in the last 168 hours. Cardiac Enzymes: No results for input(s): "CKTOTAL", "CKMB", "CKMBINDEX", "TROPONINI" in the last 168 hours. BNP (last 3 results) No results for input(s): "PROBNP" in the last 8760 hours. HbA1C: No results for input(s): "HGBA1C" in the last 72 hours. CBG: Recent Labs  Lab 09/06/22 1112 09/06/22 1542 09/06/22 2139 09/07/22 0148 09/07/22 0618  GLUCAP 291* 234* 263* 256* 237*   Lipid Profile: No results for input(s): "CHOL", "HDL", "LDLCALC", "TRIG", "CHOLHDL", "LDLDIRECT" in the last 72 hours. Thyroid Function Tests: No results for input(s): "TSH", "T4TOTAL", "FREET4", "T3FREE", "THYROIDAB" in the last 72 hours. Anemia Panel: No results for input(s): "VITAMINB12", "FOLATE", "FERRITIN", "TIBC", "IRON", "RETICCTPCT" in the last 72 hours. Urine analysis:    Component Value Date/Time   COLORURINE YELLOW 09/05/2022 1625   APPEARANCEUR CLEAR 09/05/2022 1625   LABSPEC >1.046 (H) 09/05/2022 1625   PHURINE 6.5 09/05/2022 1625   GLUCOSEU NEGATIVE 09/05/2022 1625   GLUCOSEU NEGATIVE 11/23/2015 0831   HGBUR MODERATE (A) 09/05/2022 1625   BILIRUBINUR NEGATIVE 09/05/2022 1625   BILIRUBINUR neg 12/07/2015 0843   KETONESUR >80 (A) 09/05/2022 1625   PROTEINUR 100 (A) 09/05/2022 1625   UROBILINOGEN 1.0 12/07/2015 0843   UROBILINOGEN 0.2 11/23/2015 0831   NITRITE NEGATIVE 09/05/2022 1625   LEUKOCYTESUR NEGATIVE 09/05/2022 1625   Sepsis Labs: @LABRCNTIP (procalcitonin:4,lacticidven:4)  ) Recent Results (from the past 240 hour(s))  Resp panel by RT-PCR (RSV, Flu A&B, Covid) Anterior Nasal Swab     Status: None   Collection Time: 09/05/22  1:24 PM    Specimen: Anterior Nasal Swab  Result Value Ref Range Status   SARS Coronavirus 2 by RT PCR NEGATIVE NEGATIVE Final    Comment: (NOTE) SARS-CoV-2 target nucleic acids are NOT DETECTED.  The SARS-CoV-2 RNA is generally detectable in upper respiratory specimens during the acute phase of infection. The lowest concentration of SARS-CoV-2 viral copies this assay can detect is 138 copies/mL. A negative result does not preclude SARS-Cov-2 infection and should not be used as the sole basis for treatment or other patient management decisions. A negative result may occur with  improper specimen collection/handling, submission of specimen other than nasopharyngeal swab, presence of viral mutation(s) within the areas targeted by this assay, and inadequate number of viral copies(<138 copies/mL). A negative result must be combined with clinical observations, patient history, and epidemiological information. The expected result is Negative.  Fact Sheet for Patients:  BloggerCourse.com  Fact Sheet for Healthcare Providers:  SeriousBroker.it  This test is no t yet approved or cleared by the [place] and  has been authorized for detection and/or diagnosis of SARS-CoV-2 by FDA under an Emergency Use Authorization (EUA). This EUA will remain  in effect (meaning this test can be used) for the duration of the COVID-19 declaration under Section 564(b)(1) of the Act, 21 U.S.C.section 360bbb-3(b)(1), unless the authorization is terminated  or revoked sooner.       Influenza A by PCR NEGATIVE NEGATIVE Final   Influenza B by PCR NEGATIVE NEGATIVE Final    Comment: (NOTE) The Xpert Xpress SARS-CoV-2/FLU/RSV plus assay is intended as an aid in the diagnosis of influenza from Nasopharyngeal swab specimens and should not be used as a sole basis for treatment. Nasal washings and aspirates are unacceptable for Xpert Xpress  SARS-CoV-2/FLU/RSV testing.  Fact Sheet for Patients: BloggerCourse.com  Fact Sheet for Healthcare Providers: SeriousBroker.it  This test is not yet approved or cleared by the [place] FDA and has been authorized for detection and/or diagnosis of SARS-CoV-2 by FDA under an Emergency Use Authorization (EUA). This EUA will remain in effect (meaning this test can be used) for the duration of the COVID-19 declaration under Section 564(b)(1) of the Act, 21 U.S.C. section 360bbb-3(b)(1), unless the authorization is terminated or revoked.     Resp Syncytial Virus by PCR NEGATIVE NEGATIVE Final    Comment: (NOTE) Fact Sheet for Patients: BloggerCourse.com  Fact Sheet for Healthcare Providers: SeriousBroker.it  This test is not yet approved or cleared by the [place] FDA and has been authorized for detection and/or diagnosis of SARS-CoV-2 by FDA under an Emergency Use Authorization (EUA). This EUA will remain in effect (meaning this test can be used) for the duration of the COVID-19 declaration under Section 564(b)(1) of the Act, 21 U.S.C. section 360bbb-3(b)(1), unless the authorization is terminated or revoked.  Performed at [facility], [address], [place], [place] [address]   MRSA Next Gen by PCR, Nasal     Status: None   Collection Time: 09/05/22 10:09 PM   Specimen: Nasal Mucosa; Nasal Swab  Result Value Ref Range Status   MRSA by PCR Next Gen NOT DETECTED NOT DETECTED Final    Comment: (NOTE) The GeneXpert MRSA Assay (FDA approved for NASAL specimens only), is one component of a comprehensive MRSA colonization surveillance program. It is not intended to diagnose MRSA infection nor to guide or monitor treatment for MRSA infections. Test performance is not FDA approved in patients less than 69 years old. Performed at [facility],  [address], [place], [place] [address]      Radiology Studies: ECHOCARDIOGRAM COMPLETE  Result Date: 09/06/2022    ECHOCARDIOGRAM REPORT   Patient Name:   SHAMARRIA WACHT Date of Exam: 09/06/2022 Medical Rec #:  [id number]         Height:       65.0 in Accession #:    [id number]        Weight:       135.8 lb Date of Birth:  09/05/1970        BSA:          1.678 m Patient Age:    51 years          BP:           109/66 mmHg Patient Gender: F                 HR:  87 bpm. Exam Location:  Inpatient Procedure: 2D Echo, Color Doppler and Cardiac Doppler Indications:    chest pain  History:        Patient has prior history of Echocardiogram examinations, most                 recent 08/17/2022. History of Takotsubo, CAD, Prior CABG; Risk                 Factors:Dyslipidemia and Diabetes.  Sonographer:    Delcie Roch RDCS Referring Phys: [id number] [facility] B ROBERTS  Sonographer Comments: Image acquisition challenging due to respiratory motion. IMPRESSIONS  1. Severe hypokinesis of the inferior and septal walls with overall low normal LV function.  2. Left ventricular ejection fraction, by estimation, is 50 to 55%. The left ventricle has low normal function. The left ventricle demonstrates regional wall motion abnormalities (see scoring diagram/findings for description). Left ventricular diastolic  parameters are consistent with Grade II diastolic dysfunction (pseudonormalization).  3. Right ventricular systolic function is normal. The right ventricular size is normal.  4. The mitral valve is normal in structure. No evidence of mitral valve regurgitation. No evidence of mitral stenosis.  5. The aortic valve is tricuspid. Aortic valve regurgitation is not visualized. No aortic stenosis is present.  6. The inferior vena cava is normal in size with greater than 50% respiratory variability, suggesting right atrial pressure of 3 mmHg. FINDINGS  Left Ventricle: Left ventricular ejection fraction, by estimation, is  50 to 55%. The left ventricle has low normal function. The left ventricle demonstrates regional wall motion abnormalities. The left ventricular internal cavity size was normal in size. There is no left ventricular hypertrophy. Left ventricular diastolic parameters are consistent with Grade II diastolic dysfunction (pseudonormalization). Right Ventricle: The right ventricular size is normal. No increase in right ventricular wall thickness. Right ventricular systolic function is normal. Left Atrium: Left atrial size was normal in size. Right Atrium: Right atrial size was normal in size. Pericardium: There is no evidence of pericardial effusion. Mitral Valve: The mitral valve is normal in structure. No evidence of mitral valve regurgitation. No evidence of mitral valve stenosis. Tricuspid Valve: The tricuspid valve is normal in structure. Tricuspid valve regurgitation is trivial. No evidence of tricuspid stenosis. Aortic Valve: The aortic valve is tricuspid. Aortic valve regurgitation is not visualized. No aortic stenosis is present. Pulmonic Valve: The pulmonic valve was normal in structure. Pulmonic valve regurgitation is not visualized. No evidence of pulmonic stenosis. Aorta: The aortic root is normal in size and structure. Venous: The inferior vena cava is normal in size with greater than 50% respiratory variability, suggesting right atrial pressure of 3 mmHg. IAS/Shunts: No atrial level shunt detected by color flow Doppler. Additional Comments: Severe hypokinesis of the inferior and septal walls with overall low normal LV function.  LEFT VENTRICLE PLAX 2D LVIDd:         4.50 cm     Diastology LVIDs:         3.50 cm     LV e' medial:    5.33 cm/s LV PW:         0.80 cm     LV E/e' medial:  17.6 LV IVS:        0.90 cm     LV e' lateral:   8.70 cm/s LVOT diam:     1.90 cm     LV E/e' lateral: 10.8 LV SV:         44 LV SV Index:  26 LVOT Area:     2.84 cm  LV Volumes (MOD) LV vol d, MOD A4C: 67.4 ml LV vol s, MOD  A4C: 33.3 ml LV SV MOD A4C:     67.4 ml RIGHT VENTRICLE             IVC RV Basal diam:  2.30 cm     IVC diam: 1.10 cm RV S prime:     10.60 cm/s TAPSE (M-mode): 1.3 cm LEFT ATRIUM             Index        RIGHT ATRIUM          Index LA Vol (A2C):   33.9 ml 20.20 ml/m  RA Area:     9.33 cm LA Vol (A4C):   26.1 ml 15.55 ml/m  RA Volume:   18.90 ml 11.26 ml/m LA Biplane Vol: 30.2 ml 18.00 ml/m  AORTIC VALVE LVOT Vmax:   89.50 cm/s LVOT Vmean:  58.300 cm/s LVOT VTI:    0.155 m  AORTA Ao Root diam: 2.90 cm Ao Asc diam:  2.90 cm MITRAL VALVE MV Area (PHT): 4.80 cm    SHUNTS MV Decel Time: 158 msec    Systemic VTI:  0.16 m MV E velocity: 93.60 cm/s  Systemic Diam: 1.90 cm MV A velocity: 54.20 cm/s MV E/A ratio:  1.73 Olga Millers MD Electronically signed by Olga Millers MD Signature Date/Time: 09/06/2022/5:16:45 PM    Final    CT ABDOMEN PELVIS W CONTRAST  Result Date: 09/05/2022 CLINICAL DATA:  Constipation, vomiting EXAM: CT ABDOMEN AND PELVIS WITH CONTRAST TECHNIQUE: Multidetector CT imaging of the abdomen and pelvis was performed using the standard protocol following bolus administration of intravenous contrast. RADIATION DOSE REDUCTION: This exam was performed according to the departmental dose-optimization program which includes automated exposure control, adjustment of the mA and/or kV according to patient size and/or use of iterative reconstruction technique. CONTRAST:  70mL OMNIPAQUE IOHEXOL 300 MG/ML  SOLN COMPARISON:  05/20/2022 FINDINGS: Lower chest: Small linear densities are seen in left lower lung field, possibly scarring. Hepatobiliary: No focal abnormalities are seen in liver. Gallbladder is unremarkable. There is no dilation of bile ducts. Pancreas: No focal abnormalities are seen. Spleen: Unremarkable. Adrenals/Urinary Tract: Adrenals are unremarkable. There is no hydronephrosis. There are no renal or ureteral stones. There is a 3 mm low-density in the lower pole of right kidney, possibly a  cyst. Urinary bladder is unremarkable. Stomach/Bowel: Small hiatal hernia is seen. Small bowel loops are not dilated. Appendix is not seen. There is no pericecal inflammation. There is no significant wall thickening in colon. There is no pericolic stranding. Vascular/Lymphatic: Scattered arterial calcifications are seen in aorta and its major branches. Reproductive: Unremarkable. Other: There is no ascites or pneumoperitoneum. Musculoskeletal: Degenerative changes are noted in lumbar spine, most severe at L3-L4 and L4-L5 levels with spinal stenosis and encroachment of neural foramina. No interval changes are noted. IMPRESSION: No acute findings are seen in CT scan of abdomen and pelvis. There is no evidence of intestinal obstruction or pneumoperitoneum. There is no hydronephrosis. Small hiatal hernia. Tiny right renal cyst. Aortic arteriosclerosis. Lumbar spondylosis. Electronically Signed   By: Ernie Avena M.D.   On: 09/05/2022 15:30   DG Chest Port 1 View  Result Date: 09/05/2022 CLINICAL DATA:  Chest discomfort EXAM: PORTABLE CHEST 1 VIEW COMPARISON:  CXR 06/27/22 FINDINGS: No pleural effusion. No pneumothorax. Status post median sternotomy. No radiographically apparent displaced rib fractures. Visualized upper abdomen is  unremarkable. No focal airspace opacity. Right-sided central venous catheter with the tip in the lower SVC. Degenerative changes of the bilateral glenohumeral joints. IMPRESSION: No focal airspace opacity Electronically Signed   By: Lorenza Cambridge M.D.   On: 09/05/2022 13:30     Scheduled Meds:  aspirin EC  81 mg Oral Daily   buPROPion  150 mg Oral Daily   Chlorhexidine Gluconate Cloth  6 each Topical Daily   digoxin  0.125 mg Oral Daily   insulin pump   Subcutaneous TID WC, HS, 0200   levothyroxine  100 mcg Oral QAC breakfast   LORazepam  0.5 mg Oral QHS   mometasone-formoterol  2 puff Inhalation BID   montelukast  10 mg Oral QHS   pantoprazole  40 mg Oral BID   sodium  chloride flush  10-40 mL Intracatheter Q12H   Continuous Infusions:  sodium chloride 10 mL/hr at 09/07/22 0700   DOBUTamine 1.5 mcg/kg/min (09/07/22 0700)   heparin 1,150 Units/hr (09/07/22 0700)     LOS: 2 days    Time spent:    Zannie Cove, MD [facility]   09/07/2022, 11:13 AM

## 2022-09-07 NOTE — TOC Progression Note (Addendum)
Transition of Care [facility]) - Progression Note    Patient Details  Name: Jennifer Hogan MRN: [id number] Date of Birth: 1970/07/02  Transition of Care [facility]) CM/SW Contact  Elliot Cousin, RN Phone Number: [phone] 09/07/2022, 3:26 PM  Clinical Narrative:  CM received update that Dobutamine was dc. Contacted [facility] rep, Lanora Manis to make aware. Contacted [facility] rep, Delice Bison with [facility] RN. Pt may still need HHRN for Disease Mgmt. Will continue to follow for dc needs.    No HH anticipated. Notified [facility] rep, Delice Bison to make aware.   PCP September 20, 2022 at 1000 am for hospital follow up appointment with Georgian Co PA    Expected Discharge Plan: Home w Home Health Services Barriers to Discharge: Continued Medical Work up  Expected Discharge Plan and Services   Discharge Planning Services: CM Consult Post Acute Care Choice: Home Health Living arrangements for the past 2 months: Single Family Home                             HH Agency: [facility] ([facility]) Date [facility] Agency Contacted: 09/06/22 Time HH Agency Contacted: 1345 Representative spoke with at [facility] Agency: Serina Cowper, [facility] rep, Delice Bison, [facility] rep   Social Determinants of Health (SDOH) Interventions SDOH Screenings   Food Insecurity: No Food Insecurity (05/26/2022)  Recent Concern: Food Insecurity - Food Insecurity Present (05/15/2022)  Housing: Low Risk  (05/26/2022)  Recent Concern: Housing - High Risk (05/15/2022)  Transportation Needs: No Transportation Needs (05/26/2022)  Recent Concern: Transportation Needs - Unmet Transportation Needs (05/15/2022)  Utilities: Not At Risk (05/26/2022)  Alcohol Screen: Low Risk  (10/05/2020)  Depression (PHQ2-9): Low Risk  (06/09/2022)  Financial Resource Strain: High Risk (05/15/2022)  Physical Activity: Unknown (05/15/2022)  Social Connections: Socially Isolated (05/15/2022)  Stress: Stress Concern Present (05/15/2022)  Tobacco Use: Medium  Risk (08/23/2022)    Readmission Risk Interventions    05/31/2022   11:59 AM 06/01/2021   10:05 AM  Readmission Risk Prevention Plan  Transportation Screening Complete Complete  PCP or Specialist Appt within 3-5 Days  --  HRI or Home Care Consult  Complete  Social Work Consult for Recovery Care Planning/Counseling  Complete  Palliative Care Screening  Not Applicable  Medication Review Oceanographer) Complete Complete  PCP or Specialist appointment within 3-5 days of discharge Not Complete   PCP/Specialist Appt Not Complete comments follow up 06/09/22   HRI or Home Care Consult Complete   SW Recovery Care/Counseling Consult Complete   Palliative Care Screening Not Applicable   Skilled Nursing Facility Not Applicable

## 2022-09-08 DIAGNOSIS — R7989 Other specified abnormal findings of blood chemistry: Secondary | ICD-10-CM | POA: Diagnosis not present

## 2022-09-08 DIAGNOSIS — I429 Cardiomyopathy, unspecified: Secondary | ICD-10-CM | POA: Diagnosis not present

## 2022-09-08 DIAGNOSIS — N179 Acute kidney failure, unspecified: Secondary | ICD-10-CM | POA: Diagnosis not present

## 2022-09-08 DIAGNOSIS — I214 Non-ST elevation (NSTEMI) myocardial infarction: Secondary | ICD-10-CM | POA: Diagnosis not present

## 2022-09-08 LAB — COMPREHENSIVE METABOLIC PANEL
ALT: 22 U/L (ref 0–44)
AST: 25 U/L (ref 15–41)
Albumin: 3.5 g/dL (ref 3.5–5.0)
Alkaline Phosphatase: 82 U/L (ref 38–126)
Anion gap: 12 (ref 5–15)
BUN: 17 mg/dL (ref 6–20)
CO2: 22 mmol/L (ref 22–32)
Calcium: 9 mg/dL (ref 8.9–10.3)
Chloride: 101 mmol/L (ref 98–111)
Creatinine, Ser: 1.15 mg/dL — ABNORMAL HIGH (ref 0.44–1.00)
GFR, Estimated: 57 mL/min — ABNORMAL LOW (ref >60)
Glucose, Bld: 135 mg/dL — ABNORMAL HIGH (ref 70–99)
Potassium: 3.7 mmol/L (ref 3.5–5.1)
Sodium: 135 mmol/L (ref 135–145)
Total Bilirubin: 0.8 mg/dL (ref 0.3–1.2)
Total Protein: 6.8 g/dL (ref 6.5–8.1)

## 2022-09-08 LAB — CBC
HCT: 35.6 % — ABNORMAL LOW (ref 36.0–46.0)
Hemoglobin: 11.6 g/dL — ABNORMAL LOW (ref 12.0–15.0)
MCH: 29.4 pg (ref 26.0–34.0)
MCHC: 32.6 g/dL (ref 30.0–36.0)
MCV: 90.1 fL (ref 80.0–100.0)
Platelets: 348 10*3/uL (ref 150–400)
RBC: 3.95 MIL/uL (ref 3.87–5.11)
RDW: 15.8 % — ABNORMAL HIGH (ref 11.5–15.5)
WBC: 7.9 10*3/uL (ref 4.0–10.5)
nRBC: 0 % (ref 0.0–0.2)

## 2022-09-08 LAB — GLUCOSE, CAPILLARY
Glucose-Capillary: 151 mg/dL — ABNORMAL HIGH (ref 70–99)
Glucose-Capillary: 117 mg/dL — ABNORMAL HIGH (ref 70–99)
Glucose-Capillary: 152 mg/dL — ABNORMAL HIGH (ref 70–99)
Glucose-Capillary: 180 mg/dL — ABNORMAL HIGH (ref 70–99)
Glucose-Capillary: 157 mg/dL — ABNORMAL HIGH (ref 70–99)

## 2022-09-08 LAB — URINALYSIS, ROUTINE W REFLEX MICROSCOPIC
Bilirubin Urine: NEGATIVE
Glucose, UA: NEGATIVE mg/dL
Ketones, ur: 20 mg/dL — AB
Nitrite: NEGATIVE
Protein, ur: NEGATIVE mg/dL
Specific Gravity, Urine: 1.008 (ref 1.005–1.030)
WBC, UA: >50 WBC/hpf (ref 0–5)
pH: 6 (ref 5.0–8.0)

## 2022-09-08 LAB — APTT: aPTT: 76 seconds — ABNORMAL HIGH (ref 24–36)

## 2022-09-08 LAB — COOXEMETRY PANEL
Carboxyhemoglobin: 1.9 % — ABNORMAL HIGH (ref 0.5–1.5)
Methemoglobin: <0.7 % (ref 0.0–1.5)
O2 Saturation: 72.2 %
Total hemoglobin: 11.1 g/dL — ABNORMAL LOW (ref 12.0–16.0)

## 2022-09-08 LAB — MAGNESIUM: Magnesium: 2.2 mg/dL (ref 1.7–2.4)

## 2022-09-08 LAB — HEPARIN LEVEL (UNFRACTIONATED): Heparin Unfractionated: >1.1 [IU]/mL — ABNORMAL HIGH (ref 0.30–0.70)

## 2022-09-08 MED ORDER — DULOXETINE HCL 30 MG PO CPEP
30.0000 mg | ORAL_CAPSULE | Freq: Every day | ORAL | Status: DC
  Administered 2022-09-08 – 2022-09-10 (×3): 30 mg via ORAL
  Filled 2022-09-08 (×3): qty 1

## 2022-09-08 MED ORDER — SODIUM CHLORIDE 0.9 % IV SOLN
1.0000 g | INTRAVENOUS | Status: DC
  Administered 2022-09-08 – 2022-09-10 (×3): 1 g via INTRAVENOUS
  Filled 2022-09-08 (×3): qty 10

## 2022-09-08 MED ORDER — APIXABAN 5 MG PO TABS
5.0000 mg | ORAL_TABLET | Freq: Two times a day (BID) | ORAL | Status: DC
  Administered 2022-09-08 – 2022-09-11 (×7): 5 mg via ORAL
  Filled 2022-09-08 (×7): qty 1

## 2022-09-08 MED ORDER — TRIPLE ANTIBIOTIC 3.5-400-5000 EX OINT
1.0000 | TOPICAL_OINTMENT | Freq: Two times a day (BID) | CUTANEOUS | Status: DC
  Administered 2022-09-08 – 2022-09-10 (×6): 1 via CUTANEOUS
  Filled 2022-09-08 (×8): qty 1

## 2022-09-08 NOTE — Plan of Care (Signed)
  Problem: Education: Goal: Knowledge of General Education information will improve Description: Including pain rating scale, medication(s)/side effects and non-pharmacologic comfort measures Outcome: Progressing   Problem: Health Behavior/Discharge Planning: Goal: Ability to manage health-related needs will improve Outcome: Progressing   Problem: Clinical Measurements: Goal: Ability to maintain clinical measurements within normal limits will improve Outcome: Progressing Goal: Will remain free from infection Outcome: Progressing Goal: Diagnostic test results will improve Outcome: Progressing Goal: Respiratory complications will improve Outcome: Progressing Goal: Cardiovascular complication will be avoided Outcome: Progressing   Problem: Activity: Goal: Risk for activity intolerance will decrease Outcome: Progressing   Problem: Nutrition: Goal: Adequate nutrition will be maintained Outcome: Progressing   Problem: Coping: Goal: Level of anxiety will decrease Outcome: Progressing   Problem: Elimination: Goal: Will not experience complications related to bowel motility Outcome: Progressing Goal: Will not experience complications related to urinary retention Outcome: Progressing   Problem: Pain Managment: Goal: General experience of comfort will improve Outcome: Progressing   Problem: Safety: Goal: Ability to remain free from injury will improve Outcome: Progressing   Problem: Skin Integrity: Goal: Risk for impaired skin integrity will decrease Outcome: Progressing   Problem: Education: Goal: Ability to describe self-care measures that may prevent or decrease complications (Diabetes Survival Skills Education) will improve Outcome: Progressing   Problem: Coping: Goal: Ability to adjust to condition or change in health will improve Outcome: Progressing   Problem: Fluid Volume: Goal: Ability to maintain a balanced intake and output will improve Outcome:  Progressing   Problem: Health Behavior/Discharge Planning: Goal: Ability to identify and utilize available resources and services will improve Outcome: Progressing Goal: Ability to manage health-related needs will improve Outcome: Progressing   Problem: Metabolic: Goal: Ability to maintain appropriate glucose levels will improve Outcome: Progressing   Problem: Nutritional: Goal: Maintenance of adequate nutrition will improve Outcome: Progressing Goal: Progress toward achieving an optimal weight will improve Outcome: Progressing   Problem: Skin Integrity: Goal: Risk for impaired skin integrity will decrease Outcome: Progressing   Problem: Tissue Perfusion: Goal: Adequacy of tissue perfusion will improve Outcome: Progressing   

## 2022-09-08 NOTE — Progress Notes (Addendum)
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 presented to [facility], ED on 8/13 with generalized weakness, constipation and nausea, in the ED blood glucose was 167, CO2 21, beta hydroxybutyrate acid mildly elevated at 1.48, anion gap was normal, troponin trended up to 1216, she was started on a heparin infusion and admitted to [facility], ICU, cards consulted and following -Transferred from PCCM to [facility] service 8/15  Subjective: -Had multiple episodes of nausea and some vomiting yesterday, low-grade temp of 100.3  Assessment and Plan:  NSTEMI versus demand ischemia -Troponin peaked at 1484 -Cards following -Echo noted EF of 50-55% with grade 2 diastolic dysfunction and hypokinesis of inferior and septal wall -Last cath 4/24 with significant multivessel disease, medical management was recommended then -Continue aspirin, allergic to statins, has not been on beta-blocker on account of low output, chronic DBA -now off  Nausea, vomiting, chills Low-grade fevers -No leukocytosis, follow-up blood cultures, procalcitonin reassuring, patient is adamant about deferring port removal till tomorrow unless cultures are positive -Flu and COVID PCR were negative -Abdominal exam is benign, CT abdomen pelvis on admission was unremarkable, UA benign as well -Liquid diet, advance as tolerated  Chronic systolic CHF, ischemic cardiomyopathy -Previously felt to be end-stage on dobutamine 3 mcg, EF was 20-25% in April, EF recovered to 45-50% on last echo 08/17/2022 -Cards following -Discussed with heart failure team 8/15, Dr.Bensimhon recommended to discontinue Dobutamine,  -remove port tomorrow if stable off DBA and sooner if cultures positive -Continue  digoxin -She is not on diuretics at baseline  Type 1 diabetes mellitus Hyperglycemia -Pump was malfunctioning, now fixed -Consult diabetes coordinator appreciated  Mild AKI -Resolved with hydration  History of DVT -Eliquis resumed  DVT prophylaxis: Eliquis Code Status: Full code Family Communication: None present at Disposition Plan: Home 48 hours if stable  Consultants:    Procedures:   Antimicrobials:    Objective: Vitals:   09/07/22 2303 09/08/22 0400 09/08/22 0816 09/08/22 0851  BP: 94/73 126/87  118/83  Pulse: (!) 103 (!) 117  93  Resp: 16 20  15   Temp: 98.5 F (36.9 C) 98.1 F (36.7 C)  98.4 F (36.9 C)  TempSrc: Oral Oral Oral Oral  SpO2: 99% 100%  100%  Weight:      Height:        Intake/Output Summary (Last 24 hours) at 09/08/2022 1014 Last data filed at 09/08/2022 0416 Gross per 24 hour  Intake 629.72 ml  Output --  Net 629.72 ml   Filed Weights   09/05/22 2215 09/06/22 2237 09/07/22 0400  Weight: 61.6 kg 61.7 kg 61 kg    Examination:  General exam: Appears calm and comfortable  Respiratory system: Clear to auscultation HEENT: Right IJ port noted Cardiovascular system: S1 & S2 heard, RRR.  Abd: nondistended, soft and nontender.Normal bowel sounds heard. Central nervous system: Alert and oriented. No focal neurological deficits. Extremities: no edema Skin: No rashes Psychiatry:  Mood & affect appropriate.     Data Reviewed:   CBC: Recent Labs  Lab 09/05/22 1221 09/05/22 1233 09/06/22 0149 09/07/22 0451 09/08/22 0400  WBC 13.4*  --  10.5 9.7 7.9  HGB 12.9 13.9 11.5* 12.1 11.6*  HCT 37.3 41.0 34.2* 36.5 35.6*  MCV 87.1  --  89.1 89.9 90.1  PLT 433*  --  355 375 348   Basic Metabolic Panel: Recent Labs  Lab 09/05/22 1221 09/05/22 1233 09/06/22 0149 09/07/22 0830 09/08/22 0400 09/08/22 0743  NA 134* 136 135 135 135  --   K 3.7 3.7 3.6 4.2 3.7  --   CL 98  --  99 101 101  --   CO2 21*  --  19* 22 22  --   GLUCOSE 173*   --  217* 209* 135*  --   BUN 23*  --  24* 16 17  --   CREATININE 1.16*  --  1.44* 1.16* 1.15*  --   CALCIUM 10.6*  --  9.3 9.0 9.0  --   MG  --   --  2.3  --   --  2.2  PHOS  --   --  6.1*  --   --   --    GFR: Estimated Creatinine Clearance: 50.9 mL/min (A) (by C-G formula based on SCr of 1.15 mg/dL (H)). Liver Function Tests: Recent Labs  Lab 09/05/22 1221 09/08/22 0400  AST 33 25  ALT 25 22  ALKPHOS 105 82  BILITOT 0.7 0.8  PROT 8.8* 6.8  ALBUMIN 4.9 3.5   Recent Labs  Lab 09/05/22 1221  LIPASE <10*   No results for input(s): "AMMONIA" in the last 168 hours. Coagulation Profile: No results for input(s): "INR", "PROTIME" in the last 168 hours. Cardiac Enzymes: No results for input(s): "CKTOTAL", "CKMB", "CKMBINDEX", "TROPONINI" in the last 168 hours. BNP (last 3 results) No results for input(s): "PROBNP" in the last 8760 hours. HbA1C: No results for input(s): "HGBA1C" in the last 72 hours. CBG: Recent Labs  Lab 09/07/22 1123 09/07/22 1633 09/07/22 2246 09/08/22 0232 09/08/22 0628  GLUCAP 211* 255* 203* 151* 117*   Lipid Profile: No results for input(s): "CHOL", "HDL", "LDLCALC", "TRIG", "CHOLHDL", "LDLDIRECT" in the last 72 hours. Thyroid Function Tests: No results for input(s): "TSH", "T4TOTAL", "FREET4", "T3FREE", "THYROIDAB" in the last 72 hours. Anemia Panel: No results for input(s): "VITAMINB12", "FOLATE", "FERRITIN", "TIBC", "IRON", "RETICCTPCT" in the last 72 hours. Urine analysis:    Component Value Date/Time   COLORURINE YELLOW 09/05/2022 1625   APPEARANCEUR CLEAR 09/05/2022 1625   LABSPEC >1.046 (H) 09/05/2022 1625   PHURINE 6.5 09/05/2022 1625   GLUCOSEU NEGATIVE 09/05/2022 1625   GLUCOSEU NEGATIVE 11/23/2015 0831   HGBUR MODERATE (A) 09/05/2022 1625   BILIRUBINUR NEGATIVE 09/05/2022 1625   BILIRUBINUR neg 12/07/2015 0843   KETONESUR >80 (A) 09/05/2022 1625   PROTEINUR 100 (A) 09/05/2022 1625   UROBILINOGEN 1.0 12/07/2015 0843    UROBILINOGEN 0.2 11/23/2015 0831   NITRITE NEGATIVE 09/05/2022 1625   LEUKOCYTESUR NEGATIVE 09/05/2022 1625   Sepsis Labs: @LABRCNTIP (procalcitonin:4,lacticidven:4)  ) Recent Results (from the past 240 hour(s))  Resp panel by RT-PCR (RSV, Flu A&B, Covid) Anterior Nasal Swab     Status: None   Collection Time: 09/05/22  1:24 PM   Specimen: Anterior Nasal Swab  Result Value Ref Range Status   SARS Coronavirus 2 by RT PCR NEGATIVE NEGATIVE Final    Comment: (NOTE) SARS-CoV-2 target nucleic acids are NOT DETECTED.  The SARS-CoV-2 RNA is generally detectable in upper respiratory specimens during the acute phase of infection. The lowest concentration of SARS-CoV-2 viral copies this assay can detect is 138 copies/mL. A negative result does not preclude SARS-Cov-2 infection and should not be used as the sole basis for treatment or other patient management decisions. A negative result may occur with  improper specimen collection/handling, submission of specimen other than nasopharyngeal swab, presence of viral mutation(s) within the areas targeted by this assay, and inadequate number of viral copies(<138 copies/mL). A negative result must be combined with clinical observations, patient history, and epidemiological information. The expected result is Negative.  Fact Sheet for Patients:  BloggerCourse.com  Fact Sheet for Healthcare Providers:  SeriousBroker.it  This test is no t yet approved or cleared by the [place] FDA and  has been authorized for detection and/or diagnosis of SARS-CoV-2 by FDA under an Emergency Use Authorization (EUA). This EUA will remain  in effect (meaning this test can be used) for the duration of the COVID-19 declaration under Section 564(b)(1) of the Act, 21 U.S.C.section 360bbb-3(b)(1), unless the authorization is terminated  or revoked sooner.       Influenza A by PCR NEGATIVE NEGATIVE Final    Influenza B by PCR NEGATIVE NEGATIVE Final    Comment: (NOTE) The Xpert Xpress SARS-CoV-2/FLU/RSV plus assay is intended as an aid in the diagnosis of influenza from Nasopharyngeal swab specimens and should not be used as a sole basis for treatment. Nasal washings and aspirates are unacceptable for Xpert Xpress SARS-CoV-2/FLU/RSV testing.  Fact Sheet for Patients: BloggerCourse.com  Fact Sheet for Healthcare Providers: SeriousBroker.it  This test is not yet approved or cleared by the [place] FDA and has been authorized for detection and/or diagnosis of SARS-CoV-2 by FDA under an Emergency Use Authorization (EUA). This EUA will remain in effect (meaning this test can be used) for the duration of the COVID-19 declaration under Section 564(b)(1) of the Act, 21 U.S.C. section 360bbb-3(b)(1), unless the authorization is terminated or revoked.     Resp Syncytial Virus by PCR NEGATIVE NEGATIVE Final    Comment: (NOTE) Fact Sheet for Patients: BloggerCourse.com  Fact Sheet for Healthcare Providers: SeriousBroker.it  This test is not yet approved or cleared by the [place] FDA and has been authorized for detection and/or diagnosis of SARS-CoV-2 by FDA under an Emergency Use Authorization (EUA). This EUA will remain in effect (meaning this test can be used) for the duration of the COVID-19 declaration under Section 564(b)(1) of the Act, 21 U.S.C. section 360bbb-3(b)(1), unless the authorization is terminated or revoked.  Performed at [facility], [address] [place], [place] [address]   MRSA Next Gen by PCR, Nasal     Status: None   Collection Time: 09/05/22 10:09 PM   Specimen: Nasal Mucosa; Nasal Swab  Result Value Ref Range Status   MRSA by PCR Next Gen NOT DETECTED NOT DETECTED Final    Comment: (NOTE) The GeneXpert MRSA Assay (FDA approved  for NASAL specimens only), is one component of a comprehensive MRSA colonization surveillance program. It is not intended to diagnose MRSA infection nor to guide or monitor treatment for MRSA infections. Test performance is not FDA approved in patients less than 20 years old. Performed at [facility], [address], [place], [place] [address]   Culture, blood (Routine X 2) w Reflex to ID Panel     Status: None (Preliminary result)   Collection Time: 09/07/22 10:06 AM   Specimen: BLOOD LEFT HAND  Result Value Ref Range Status   Specimen Description BLOOD LEFT HAND  Final   Special Requests   Final    BOTTLES DRAWN AEROBIC AND ANAEROBIC Blood Culture adequate volume   Culture   Final    NO GROWTH < 24 HOURS Performed at [facility], [address], [place], [place] [address]    Report Status PENDING  Incomplete  Culture, blood (Routine X 2) w Reflex to ID Panel     Status: None (Preliminary result)   Collection Time: 09/07/22 10:06 AM   Specimen: BLOOD RIGHT HAND  Result Value Ref Range Status   Specimen Description BLOOD RIGHT HAND  Final   Special Requests   Final    BOTTLES DRAWN AEROBIC AND ANAEROBIC Blood Culture adequate volume   Culture   Final    NO GROWTH < 24 HOURS Performed at [facility], [address] [place], [place] [address]    Report Status PENDING  Incomplete     Radiology Studies: ECHOCARDIOGRAM COMPLETE  Result Date: 09/06/2022    ECHOCARDIOGRAM REPORT   Patient Name:   Jennifer Hogan Date of Exam: 09/06/2022 Medical Rec #:  [id number]         Height:       65.0 in Accession #:    [id number]        Weight:       135.8 lb Date of Birth:  May 03, 1970        BSA:          1.678 m Patient Age:    51 years          BP:           109/66 mmHg Patient Gender: F                 HR:           87 bpm. Exam Location:  Inpatient Procedure: 2D Echo, Color Doppler and Cardiac Doppler Indications:    chest pain  History:        Patient has prior history  of Echocardiogram examinations, most                 recent 08/17/2022. History of Takotsubo, CAD, Prior CABG; Risk                 Factors:Dyslipidemia and Diabetes.  Sonographer:    Delcie Roch RDCS Referring Phys: [phone] [facility]  Sonographer Comments: Image acquisition challenging due to respiratory motion. IMPRESSIONS  1. Severe hypokinesis of the inferior and septal walls with overall low normal LV function.  2. Left ventricular ejection fraction, by estimation, is 50 to 55%. The left ventricle has low normal function. The left ventricle demonstrates regional wall motion abnormalities (see scoring diagram/findings for description). Left ventricular diastolic  parameters are consistent with Grade II diastolic dysfunction (pseudonormalization).  3. Right ventricular systolic function is normal. The right ventricular size is normal.  4. The mitral valve is normal in structure. No evidence of mitral valve regurgitation. No evidence of mitral stenosis.  5. The aortic valve is tricuspid. Aortic valve regurgitation is not visualized. No aortic stenosis is present.  6. The inferior vena cava is normal in size with greater than 50% respiratory variability, suggesting right atrial pressure of 3 mmHg. FINDINGS  Left Ventricle: Left ventricular ejection fraction, by estimation, is 50 to 55%. The left ventricle has low normal function. The left ventricle demonstrates regional wall motion abnormalities. The left ventricular internal cavity size was normal in size. There is no left ventricular hypertrophy. Left ventricular diastolic parameters are consistent with Grade II diastolic dysfunction (pseudonormalization). Right Ventricle: The right ventricular size is normal. No increase in right ventricular wall thickness. Right ventricular systolic function is normal. Left Atrium: Left atrial size was normal in size.  Right Atrium: Right atrial size was normal in size. Pericardium: There is no evidence of pericardial  effusion. Mitral Valve: The mitral valve is normal in structure. No evidence of mitral valve regurgitation. No evidence of mitral valve stenosis. Tricuspid Valve: The tricuspid valve is normal in structure. Tricuspid valve regurgitation is trivial. No evidence of tricuspid stenosis. Aortic Valve: The aortic valve is tricuspid. Aortic valve regurgitation is not visualized. No aortic stenosis is present. Pulmonic Valve: The pulmonic valve was normal in structure. Pulmonic valve regurgitation is not visualized. No evidence of pulmonic stenosis. Aorta: The aortic root is normal in size and structure. Venous: The inferior vena cava is normal in size with greater than 50% respiratory variability, suggesting right atrial pressure of 3 mmHg. IAS/Shunts: No atrial level shunt detected by color flow Doppler. Additional Comments: Severe hypokinesis of the inferior and septal walls with overall low normal LV function.  LEFT VENTRICLE PLAX 2D LVIDd:         4.50 cm     Diastology LVIDs:         3.50 cm     LV e' medial:    5.33 cm/s LV PW:         0.80 cm     LV E/e' medial:  17.6 LV IVS:        0.90 cm     LV e' lateral:   8.70 cm/s LVOT diam:     1.90 cm     LV E/e' lateral: 10.8 LV SV:         44 LV SV Index:   26 LVOT Area:     2.84 cm  LV Volumes (MOD) LV vol d, MOD A4C: 67.4 ml LV vol s, MOD A4C: 33.3 ml LV SV MOD A4C:     67.4 ml RIGHT VENTRICLE             IVC RV Basal diam:  2.30 cm     IVC diam: 1.10 cm RV S prime:     10.60 cm/s TAPSE (M-mode): 1.3 cm LEFT ATRIUM             Index        RIGHT ATRIUM          Index LA Vol (A2C):   33.9 ml 20.20 ml/m  RA Area:     9.33 cm LA Vol (A4C):   26.1 ml 15.55 ml/m  RA Volume:   18.90 ml 11.26 ml/m LA Biplane Vol: 30.2 ml 18.00 ml/m  AORTIC VALVE LVOT Vmax:   89.50 cm/s LVOT Vmean:  58.300 cm/s LVOT VTI:    0.155 m  AORTA Ao Root diam: 2.90 cm Ao Asc diam:  2.90 cm MITRAL VALVE MV Area (PHT): 4.80 cm    SHUNTS MV Decel Time: 158 msec    Systemic VTI:  0.16 m MV E  velocity: 93.60 cm/s  Systemic Diam: 1.90 cm MV A velocity: 54.20 cm/s MV E/A ratio:  1.73 Olga Millers MD Electronically signed by Olga Millers MD Signature Date/Time: 09/06/2022/5:16:45 PM    Final      Scheduled Meds:  apixaban  5 mg Oral BID   aspirin EC  81 mg Oral Daily   buPROPion  150 mg Oral Daily   Chlorhexidine Gluconate Cloth  6 each Topical Daily   digoxin  0.125 mg Oral Daily   insulin pump   Subcutaneous TID WC, HS, 0200   levothyroxine  100 mcg Oral QAC breakfast   LORazepam  0.5 mg  Oral QHS   mometasone-formoterol  2 puff Inhalation BID   montelukast  10 mg Oral QHS   pantoprazole  40 mg Oral BID   sodium chloride flush  10-40 mL Intracatheter Q12H   Continuous Infusions:  sodium chloride Stopped (09/07/22 1543)   promethazine (PHENERGAN) injection (IM or IVPB) Stopped (09/07/22 2203)     LOS: 3 days    Time spent:    Zannie Cove, MD [facility]   09/08/2022, 10:14 AM

## 2022-09-08 NOTE — Inpatient Diabetes Management (Signed)
Inpatient Diabetes Program Recommendations  AACE/ADA: New Consensus Statement on Inpatient Glycemic Control   Target Ranges:  Prepandial:   less than 140 mg/dL      Peak postprandial:   less than 180 mg/dL (1-2 hours)      Critically ill patients:  140 - 180 mg/dL    Latest Reference Range & Units 09/07/22 06:18 09/07/22 11:23 09/07/22 16:33 09/07/22 22:46 09/08/22 02:32 09/08/22 06:28  Glucose-Capillary 70 - 99 mg/dL 161 (H) 096 (H) 045 (H) 203 (H) 151 (H) 117 (H)   Review of Glycemic Control  Diabetes history: DM1 (does NOT make any insulin; requires basal, correction, and carb coverage insulin) Outpatient Diabetes medications: OmniPod insulin pump with Humalog insulin;  Current orders for Inpatient glycemic control: Insulin Pump AC&HS and 2am  Inpatient Diabetes Program Recommendations:    Insulin Pump: CBGs today improved. No changes recommended at this time.  NOTE: Patient has DM1 and uses an insulin pump for DM control. Patient sees Dr. Elvera Lennox and was last seen on 05/03/22. Per office note on 05/03/22 patient instructions: Please use the following pump settings: - basal rates: 12 am: 0.550 >> 0.500 units/h 6 am: 0.625 10 am: 0.600 >> 0.625 10 pm: 0.650 >> 0.625 - ICR: 1:17 >> 1:15 - target: 100-120 - ISF: 75 (may need to change to 65-70) - Insulin on Board: 4h  Thanks, Orlando Penner, RN, MSN, CDCES Diabetes Coordinator Inpatient Diabetes Program [phone] (Team Pager from 8am to 5pm)

## 2022-09-08 NOTE — Progress Notes (Signed)
Rounding Note    Patient Name: Jennifer Hogan Date of Encounter: 09/08/2022  [facility] Cardiologist: Armanda Magic, MD   Subjective   N/v is better. No chest pain. Says she is feeling much better, but still not wanting to eat.   Inpatient Medications    Scheduled Meds:  apixaban  5 mg Oral BID   aspirin EC  81 mg Oral Daily   buPROPion  150 mg Oral Daily   Chlorhexidine Gluconate Cloth  6 each Topical Daily   digoxin  0.125 mg Oral Daily   insulin pump   Subcutaneous TID WC, HS, 0200   levothyroxine  100 mcg Oral QAC breakfast   LORazepam  0.5 mg Oral QHS   mometasone-formoterol  2 puff Inhalation BID   montelukast  10 mg Oral QHS   neomycin-bacitracin-polymyxin  1 Application Apply externally BID   pantoprazole  40 mg Oral BID   sodium chloride flush  10-40 mL Intracatheter Q12H   Continuous Infusions:  sodium chloride Stopped (09/07/22 1543)   promethazine (PHENERGAN) injection (IM or IVPB) Stopped (09/07/22 2203)   PRN Meds: sodium chloride, metoCLOPramide (REGLAN) injection, ondansetron (ZOFRAN) IV, ondansetron, mouth rinse, polyethylene glycol, promethazine (PHENERGAN) injection (IM or IVPB), senna, sodium chloride flush   Vital Signs    Vitals:   09/08/22 0400 09/08/22 0816 09/08/22 0851 09/08/22 1209  BP: 126/87  118/83 109/71  Pulse: (!) 117  93 100  Resp: 20  15 20   Temp: 98.1 F (36.7 C)  98.4 F (36.9 C) 97.9 F (36.6 C)  TempSrc: Oral Oral Oral Oral  SpO2: 100%  100% 100%  Weight:      Height:        Intake/Output Summary (Last 24 hours) at 09/08/2022 1541 Last data filed at 09/08/2022 0416 Gross per 24 hour  Intake 629.72 ml  Output --  Net 629.72 ml      09/07/2022    4:00 AM 09/06/2022   10:37 PM 09/05/2022   10:15 PM  Last 3 Weights  Weight (lbs) 134 lb 7.7 oz 136 lb 0.4 oz 135 lb 12.9 oz  Weight (kg) 61 kg 61.7 kg 61.6 kg      Telemetry    Sinus  Tachycardia, short run of NSVT- Personally Reviewed  Physical Exam    GEN: No acute distress.   Neck: No JVD Cardiac: Heart rate improved, now in the 80s to 90s.  Regular rhythm no ectopy.  1/6 systolic murmur. Respiratory: No rales or wheezes. GI: Soft, nontender, non-distended  MS: No clubbing cyanosis or edema Neuro:  Nonfocal  Psych: Normal affect   Labs    High Sensitivity Troponin:   Recent Labs  Lab 09/05/22 1221 09/05/22 1422 09/06/22 0149  TROPONINIHS 1,217* 1,216* 1,484*     Chemistry Recent Labs  Lab 09/05/22 1221 09/05/22 1233 09/06/22 0149 09/07/22 0830 09/08/22 0400 09/08/22 0743  NA 134*   < > 135 135 135  --   K 3.7   < > 3.6 4.2 3.7  --   CL 98  --  99 101 101  --   CO2 21*  --  19* 22 22  --   GLUCOSE 173*  --  217* 209* 135*  --   BUN 23*  --  24* 16 17  --   CREATININE 1.16*  --  1.44* 1.16* 1.15*  --   CALCIUM 10.6*  --  9.3 9.0 9.0  --   MG  --   --  2.3  --   --  2.2  PROT 8.8*  --   --   --  6.8  --   ALBUMIN 4.9  --   --   --  3.5  --   AST 33  --   --   --  25  --   ALT 25  --   --   --  22  --   ALKPHOS 105  --   --   --  82  --   BILITOT 0.7  --   --   --  0.8  --   GFRNONAA 56*  --  43* 56* 57*  --   ANIONGAP 15  --  17* 12 12  --    < > = values in this interval not displayed.    Lipids No results for input(s): "CHOL", "TRIG", "HDL", "LABVLDL", "LDLCALC", "CHOLHDL" in the last 168 hours.  Hematology Recent Labs  Lab 09/06/22 0149 09/07/22 0451 09/08/22 0400  WBC 10.5 9.7 7.9  RBC 3.84* 4.06 3.95  HGB 11.5* 12.1 11.6*  HCT 34.2* 36.5 35.6*  MCV 89.1 89.9 90.1  MCH 29.9 29.8 29.4  MCHC 33.6 33.2 32.6  RDW 16.5* 16.0* 15.8*  PLT 355 375 348   Thyroid No results for input(s): "TSH", "FREET4" in the last 168 hours.  BNPNo results for input(s): "BNP", "PROBNP" in the last 168 hours.  DDimer No results for input(s): "DDIMER" in the last 168 hours.   Radiology    CT ABDOMEN PELVIS W CONTRAST  Result Date: 09/05/2022 CLINICAL DATA:  Constipation, vomiting EXAM: CT ABDOMEN AND PELVIS WITH  CONTRAST TECHNIQUE: Multidetector CT imaging of the abdomen and pelvis was performed using the standard protocol following bolus administration of intravenous contrast. RADIATION DOSE REDUCTION: This exam was performed according to the departmental dose-optimization program which includes automated exposure control, adjustment of the mA and/or kV according to patient size and/or use of iterative reconstruction technique. CONTRAST:  70mL OMNIPAQUE IOHEXOL 300 MG/ML  SOLN COMPARISON:  05/20/2022 FINDINGS: Lower chest: Small linear densities are seen in left lower lung field, possibly scarring. Hepatobiliary: No focal abnormalities are seen in liver. Gallbladder is unremarkable. There is no dilation of bile ducts. Pancreas: No focal abnormalities are seen. Spleen: Unremarkable. Adrenals/Urinary Tract: Adrenals are unremarkable. There is no hydronephrosis. There are no renal or ureteral stones. There is a 3 mm low-density in the lower pole of right kidney, possibly a cyst. Urinary bladder is unremarkable. Stomach/Bowel: Small hiatal hernia is seen. Small bowel loops are not dilated. Appendix is not seen. There is no pericecal inflammation. There is no significant wall thickening in colon. There is no pericolic stranding. Vascular/Lymphatic: Scattered arterial calcifications are seen in aorta and its major branches. Reproductive: Unremarkable. Other: There is no ascites or pneumoperitoneum. Musculoskeletal: Degenerative changes are noted in lumbar spine, most severe at L3-L4 and L4-L5 levels with spinal stenosis and encroachment of neural foramina. No interval changes are noted. IMPRESSION: No acute findings are seen in CT scan of abdomen and pelvis. There is no evidence of intestinal obstruction or pneumoperitoneum. There is no hydronephrosis. Small hiatal hernia. Tiny right renal cyst. Aortic arteriosclerosis. Lumbar spondylosis. Electronically Signed   By: Ernie Avena M.D.   On: 09/05/2022 15:30   DG Chest  Port 1 View  Result Date: 09/05/2022 CLINICAL DATA:  Chest discomfort EXAM: PORTABLE CHEST 1 VIEW COMPARISON:  CXR 06/27/22 FINDINGS: No pleural effusion. No pneumothorax. Status post median sternotomy. No radiographically apparent displaced rib  fractures. Visualized upper abdomen is unremarkable. No focal airspace opacity. Right-sided central venous catheter with the tip in the lower SVC. Degenerative changes of the bilateral glenohumeral joints. IMPRESSION: No focal airspace opacity Electronically Signed   By: Lorenza Cambridge M.D.   On: 09/05/2022 13:30    Cardiac Studies   Echo: 09/06/2022  IMPRESSIONS     1. Severe hypokinesis of the inferior and septal walls with overall low  normal LV function.   2. Left ventricular ejection fraction, by estimation, is 50 to 55%. The  left ventricle has low normal function. The left ventricle demonstrates  regional wall motion abnormalities (see scoring diagram/findings for  description). Left ventricular diastolic   parameters are consistent with Grade II diastolic dysfunction  (pseudonormalization).   3. Right ventricular systolic function is normal. The right ventricular  size is normal.   4. The mitral valve is normal in structure. No evidence of mitral valve  regurgitation. No evidence of mitral stenosis.   5. The aortic valve is tricuspid. Aortic valve regurgitation is not  visualized. No aortic stenosis is present.   6. The inferior vena cava is normal in size with greater than 50%  respiratory variability, suggesting right atrial pressure of 3 mmHg.   FINDINGS   Left Ventricle: Left ventricular ejection fraction, by estimation, is 50  to 55%. The left ventricle has low normal function. The left ventricle  demonstrates regional wall motion abnormalities. The left ventricular  internal cavity size was normal in  size. There is no left ventricular hypertrophy. Left ventricular diastolic  parameters are consistent with Grade II diastolic  dysfunction  (pseudonormalization).   Right Ventricle: The right ventricular size is normal. No increase in  right ventricular wall thickness. Right ventricular systolic function is  normal.   Left Atrium: Left atrial size was normal in size.   Right Atrium: Right atrial size was normal in size.   Pericardium: There is no evidence of pericardial effusion.   Mitral Valve: The mitral valve is normal in structure. No evidence of  mitral valve regurgitation. No evidence of mitral valve stenosis.   Tricuspid Valve: The tricuspid valve is normal in structure. Tricuspid  valve regurgitation is trivial. No evidence of tricuspid stenosis.   Aortic Valve: The aortic valve is tricuspid. Aortic valve regurgitation is  not visualized. No aortic stenosis is present.   Pulmonic Valve: The pulmonic valve was normal in structure. Pulmonic valve  regurgitation is not visualized. No evidence of pulmonic stenosis.   Aorta: The aortic root is normal in size and structure.   Venous: The inferior vena cava is normal in size with greater than 50%  respiratory variability, suggesting right atrial pressure of 3 mmHg.   IAS/Shunts: No atrial level shunt detected by color flow Doppler.   Additional Comments: Severe hypokinesis of the inferior and septal walls  with overall low normal LV function.    Patient Profile     52 y.o. female with a hx of  HFrEF (EF 45-50% on Dobuta 3), CAD status post coronary artery bypass and graft (LIMA to the LAD/ramus intermedius and RIMA to the obtuse marginal), DM Type I, prior DVT, anemia, anxiety, hypothyroidism, and hyperlipidemia who was seen 09/05/2022 for the evaluation of NSTEMI iso c/f DKA.   Assessment & Plan    NSTEMI CAD s/p 3v CABG (LIMA to the LAD/ramus intermedius and RIMA to the obtuse marginal) -- presented with abd pain, n/v for several days prior to admission. She had recent admission 04/2022 with  similar episode with elevated troponins. Recent cardiac  catheterization May 23, 2022 revealed a patent LIMA graft to LAD and patent vein graft to left circumflex.  There was minimal disease in the RCA and she had diffuse disease beyond the LIMA graft in the very distal LAD, small caliber not amenable to intervention.  Troponin increased due to demand ischemia and not acute coronary syndrome.  She feels much better today.  Heart rate has improved and now is in sinus rhythm in the upper 80s to low 90s off dobutamine.  She believes she is significantly deconditioned and close discharged with like to participate in outpatient: Cardiac rehab program. -- continue ASA  HFrEF hx of low output -- remotely reduced LVEF of 20-25% which improved to 45-50% on last outpatient echo. Has been followed in the AHF clinic with Dr. Gala Romney on home dobutamine.  -- repeat echo this admission shows further improvement with LVEF of 50-55%, g2DD, normal RV function  -- She is now off dobutamine.  Heart rate has improved and is now in the upper 80s to low 90s without ectopy.  She continues to be on digoxin 0.125 mg daily.  PICC line is still in place.  She is afebrile today and if possible would like to keep this in place.  Type 1 DM DKA -- several recurrent admissions, trouble with insulin pump in the past -- treated with IVFs, lactic acid 2.4>>2.1>>1.2  AKI -- Cr 1.16>>1.4>>1.16 > 1.15 today, improved with fluids  Hx of DVT -- Off heparin, now back on Eliquis 5 mg twice daily.   For questions or updates, please contact [facility] Please consult [email or web address] for contact info under        Signed, Nicki Guadalajara, MD  09/08/2022, 3:41 PM

## 2022-09-08 NOTE — Progress Notes (Signed)
Acute cystitis: Patient reporting burning sensation and increased urgency with urination.  UA showed evidence of UTI.  Checking urine culture. - Starting treating with ceftriaxone 1 g every 24 hours for symptomatic UTI.  Tereasa Coop, MD [facility] 09/08/2022, 11:07 PM

## 2022-09-09 DIAGNOSIS — I214 Non-ST elevation (NSTEMI) myocardial infarction: Secondary | ICD-10-CM | POA: Diagnosis not present

## 2022-09-09 LAB — GLUCOSE, CAPILLARY
Glucose-Capillary: 107 mg/dL — ABNORMAL HIGH (ref 70–99)
Glucose-Capillary: 89 mg/dL (ref 70–99)
Glucose-Capillary: 222 mg/dL — ABNORMAL HIGH (ref 70–99)
Glucose-Capillary: 162 mg/dL — ABNORMAL HIGH (ref 70–99)
Glucose-Capillary: 86 mg/dL (ref 70–99)

## 2022-09-09 LAB — CBC
HCT: 31.7 % — ABNORMAL LOW (ref 36.0–46.0)
Hemoglobin: 10.8 g/dL — ABNORMAL LOW (ref 12.0–15.0)
MCH: 31.2 pg (ref 26.0–34.0)
MCHC: 34.1 g/dL (ref 30.0–36.0)
MCV: 91.6 fL (ref 80.0–100.0)
Platelets: 296 10*3/uL (ref 150–400)
RBC: 3.46 MIL/uL — ABNORMAL LOW (ref 3.87–5.11)
RDW: 15.9 % — ABNORMAL HIGH (ref 11.5–15.5)
WBC: 5.5 10*3/uL (ref 4.0–10.5)
nRBC: 0 % (ref 0.0–0.2)

## 2022-09-09 LAB — COMPREHENSIVE METABOLIC PANEL
ALT: 22 U/L (ref 0–44)
AST: 24 U/L (ref 15–41)
Albumin: 3.3 g/dL — ABNORMAL LOW (ref 3.5–5.0)
Alkaline Phosphatase: 73 U/L (ref 38–126)
Anion gap: 8 (ref 5–15)
BUN: 12 mg/dL (ref 6–20)
CO2: 24 mmol/L (ref 22–32)
Calcium: 8.7 mg/dL — ABNORMAL LOW (ref 8.9–10.3)
Chloride: 104 mmol/L (ref 98–111)
Creatinine, Ser: 1.28 mg/dL — ABNORMAL HIGH (ref 0.44–1.00)
GFR, Estimated: 50 mL/min — ABNORMAL LOW (ref >60)
Glucose, Bld: 101 mg/dL — ABNORMAL HIGH (ref 70–99)
Potassium: 3.3 mmol/L — ABNORMAL LOW (ref 3.5–5.1)
Sodium: 136 mmol/L (ref 135–145)
Total Bilirubin: 0.3 mg/dL (ref 0.3–1.2)
Total Protein: 6.1 g/dL — ABNORMAL LOW (ref 6.5–8.1)

## 2022-09-09 LAB — COOXEMETRY PANEL
Carboxyhemoglobin: 1.5 % (ref 0.5–1.5)
Methemoglobin: 0.8 % (ref 0.0–1.5)
O2 Saturation: 66.1 %
Total hemoglobin: 11.1 g/dL — ABNORMAL LOW (ref 12.0–16.0)

## 2022-09-09 MED ORDER — PHENAZOPYRIDINE HCL 100 MG PO TABS
100.0000 mg | ORAL_TABLET | Freq: Three times a day (TID) | ORAL | Status: DC
  Administered 2022-09-09: 100 mg via ORAL
  Filled 2022-09-09 (×2): qty 1

## 2022-09-09 MED ORDER — INSULIN ASPART 100 UNIT/ML IJ SOLN: 0.0000 [IU] | Freq: Three times a day (TID) | INTRAMUSCULAR | Status: DC

## 2022-09-09 MED ORDER — TRAMADOL HCL 50 MG PO TABS
25.0000 mg | ORAL_TABLET | Freq: Four times a day (QID) | ORAL | Status: AC | PRN
  Administered 2022-09-09: 25 mg via ORAL
  Filled 2022-09-09 (×2): qty 1

## 2022-09-09 MED ORDER — PHENAZOPYRIDINE HCL 100 MG PO TABS
100.0000 mg | ORAL_TABLET | Freq: Three times a day (TID) | ORAL | Status: AC
  Administered 2022-09-09 – 2022-09-10 (×3): 100 mg via ORAL
  Filled 2022-09-09 (×3): qty 1

## 2022-09-09 MED ORDER — TRAMADOL HCL 50 MG PO TABS: 25.0000 mg | ORAL_TABLET | Freq: Four times a day (QID) | ORAL | Status: DC | PRN

## 2022-09-09 MED ORDER — POTASSIUM CHLORIDE CRYS ER 20 MEQ PO TBCR
40.0000 meq | EXTENDED_RELEASE_TABLET | Freq: Once | ORAL | Status: AC
  Administered 2022-09-09: 40 meq via ORAL
  Filled 2022-09-09: qty 2

## 2022-09-09 MED ORDER — PHENAZOPYRIDINE HCL 100 MG PO TABS: 100.0000 mg | ORAL_TABLET | Freq: Three times a day (TID) | ORAL | Status: DC

## 2022-09-09 NOTE — Progress Notes (Signed)
PROGRESS NOTE    Jennifer Hogan  ZOX:[id number] DOB: 03-04-1970 DOA: 09/05/2022 PCP: [name], NP  51/F with history of chronic systolic CHF, ischemic cardiomyopathy on home dobutamine, CAD/CABG, type 1 diabetes mellitus, chronic anemia, anxiety, hypothyroidism, left IJ DVT on Eliquis presented to [facility], ED on 8/13 with generalized weakness, constipation and nausea, in the ED blood glucose was 167, CO2 21, beta hydroxybutyrate acid mildly elevated at 1.48, anion gap was normal, troponin trended up to 1216, she was started on a heparin infusion and admitted to [facility], ICU, cards consulted and following -Transferred from PCCM to [facility] service 8/15  Subjective: -Had urinary symptoms last night, but burning and dysuria, started antibiotics, feels better this morning  Assessment and Plan:  NSTEMI versus demand ischemia -Troponin peaked at 1484 -Cards following -Echo noted EF of 50-55% with grade 2 diastolic dysfunction and hypokinesis of inferior and septal wall -Last cath 4/24 with significant multivessel disease, medical management was recommended then -Continue aspirin, allergic to statins,   Nausea, vomiting, chills Low-grade fevers ? UTI -No leukocytosis, follow-up blood cultures, procalcitonin reassuring,  -Flu and COVID PCR were negative -Abdominal exam is benign, CT abdomen pelvis on admission was unremarkable, UA benign on admission -Improving, advance diet -Repeat UA yesterday with increased WBCs, rare bacteria only, started on ceftriaxone given possible UTI symptoms, follow-up cultures -Also removed tunneled central line  Chronic systolic CHF, ischemic cardiomyopathy -Previously felt to be end-stage on dobutamine 3 mcg, EF was 20-25% in April, EF recovered to 45-50% on last echo 08/17/2022 -Cards following -Discussed with heart failure team 8/15, Dr.Bensimhon recommended to discontinue Dobutamine,  -remove central line today -Continue digoxin -She is not on  diuretics at baseline  Type 1 diabetes mellitus Hyperglycemia -Pump was malfunctioning, now fixed -Consult diabetes coordinator appreciated  Mild AKI -Resolved with hydration  History of DVT -Eliquis resumed  DVT prophylaxis: Eliquis Code Status: Full code Family Communication: None present at Disposition Plan: Home tomorrow if stable  Consultants:    Procedures:   Antimicrobials:    Objective: Vitals:   09/09/22 0002 09/09/22 0324 09/09/22 0840 09/09/22 0841  BP: 100/66 103/64 118/78   Pulse: 72 86 91   Resp: 18 18 16    Temp: 98.5 F (36.9 C) 97.9 F (36.6 C) 98.4 F (36.9 C)   TempSrc: Oral Oral Oral   SpO2: 98% 99% 99% 98%  Weight:      Height:        Intake/Output Summary (Last 24 hours) at 09/09/2022 1132 Last data filed at 09/09/2022 0031 Gross per 24 hour  Intake 580 ml  Output 300 ml  Net 280 ml   Filed Weights   09/05/22 2215 09/06/22 2237 09/07/22 0400  Weight: 61.6 kg 61.7 kg 61 kg    Examination:  Gen: Awake, Alert, Oriented X 3,  HEENT: no JVD, R IJ port Lungs: Good air movement bilaterally, CTAB CVS: S1S2/RRR Abd: soft, Non tender, non distended, BS present Extremities: No edema Skin: no new rashes on exposed skin     Data Reviewed:   CBC: Recent Labs  Lab 09/05/22 1221 09/05/22 1233 09/06/22 0149 09/07/22 0451 09/08/22 0400 09/09/22 0340  WBC 13.4*  --  10.5 9.7 7.9 5.5  HGB 12.9 13.9 11.5* 12.1 11.6* 10.8*  HCT 37.3 41.0 34.2* 36.5 35.6* 31.7*  MCV 87.1  --  89.1 89.9 90.1 91.6  PLT 433*  --  355 375 348 296   Basic Metabolic Panel: Recent Labs  Lab 09/05/22 1221  09/05/22 1233 09/06/22 0149 09/07/22 0830 09/08/22 0400 09/08/22 0743 09/09/22 0340  NA 134* 136 135 135 135  --  136  K 3.7 3.7 3.6 4.2 3.7  --  3.3*  CL 98  --  99 101 101  --  104  CO2 21*  --  19* 22 22  --  24  GLUCOSE 173*  --  217* 209* 135*  --  101*  BUN 23*  --  24* 16 17  --  12  CREATININE 1.16*  --  1.44* 1.16* 1.15*  --  1.28*   CALCIUM 10.6*  --  9.3 9.0 9.0  --  8.7*  MG  --   --  2.3  --   --  2.2  --   PHOS  --   --  6.1*  --   --   --   --    GFR: Estimated Creatinine Clearance: 45.7 mL/min (A) (by C-G formula based on SCr of 1.28 mg/dL (H)). Liver Function Tests: Recent Labs  Lab 09/05/22 1221 09/08/22 0400 09/09/22 0340  AST 33 25 24  ALT 25 22 22   ALKPHOS 105 82 73  BILITOT 0.7 0.8 0.3  PROT 8.8* 6.8 6.1*  ALBUMIN 4.9 3.5 3.3*   Recent Labs  Lab 09/05/22 1221  LIPASE <10*   No results for input(s): "AMMONIA" in the last 168 hours. Coagulation Profile: No results for input(s): "INR", "PROTIME" in the last 168 hours. Cardiac Enzymes: No results for input(s): "CKTOTAL", "CKMB", "CKMBINDEX", "TROPONINI" in the last 168 hours. BNP (last 3 results) No results for input(s): "PROBNP" in the last 8760 hours. HbA1C: No results for input(s): "HGBA1C" in the last 72 hours. CBG: Recent Labs  Lab 09/08/22 1213 09/08/22 1554 09/08/22 2057 09/09/22 0326 09/09/22 0629  GLUCAP 152* 180* 157* 107* 89   Lipid Profile: No results for input(s): "CHOL", "HDL", "LDLCALC", "TRIG", "CHOLHDL", "LDLDIRECT" in the last 72 hours. Thyroid Function Tests: No results for input(s): "TSH", "T4TOTAL", "FREET4", "T3FREE", "THYROIDAB" in the last 72 hours. Anemia Panel: No results for input(s): "VITAMINB12", "FOLATE", "FERRITIN", "TIBC", "IRON", "RETICCTPCT" in the last 72 hours. Urine analysis:    Component Value Date/Time   COLORURINE YELLOW 09/08/2022 1625   APPEARANCEUR HAZY (A) 09/08/2022 1625   LABSPEC 1.008 09/08/2022 1625   PHURINE 6.0 09/08/2022 1625   GLUCOSEU NEGATIVE 09/08/2022 1625   GLUCOSEU NEGATIVE 11/23/2015 0831   HGBUR MODERATE (A) 09/08/2022 1625   BILIRUBINUR NEGATIVE 09/08/2022 1625   BILIRUBINUR neg 12/07/2015 0843   KETONESUR 20 (A) 09/08/2022 1625   PROTEINUR NEGATIVE 09/08/2022 1625   UROBILINOGEN 1.0 12/07/2015 0843   UROBILINOGEN 0.2 11/23/2015 0831   NITRITE NEGATIVE  09/08/2022 1625   LEUKOCYTESUR LARGE (A) 09/08/2022 1625   Sepsis Labs: @LABRCNTIP (procalcitonin:4,lacticidven:4)  ) Recent Results (from the past 240 hour(s))  Resp panel by RT-PCR (RSV, Flu A&B, Covid) Anterior Nasal Swab     Status: None   Collection Time: 09/05/22  1:24 PM   Specimen: Anterior Nasal Swab  Result Value Ref Range Status   SARS Coronavirus 2 by RT PCR NEGATIVE NEGATIVE Final    Comment: (NOTE) SARS-CoV-2 target nucleic acids are NOT DETECTED.  The SARS-CoV-2 RNA is generally detectable in upper respiratory specimens during the acute phase of infection. The lowest concentration of SARS-CoV-2 viral copies this assay can detect is 138 copies/mL. A negative result does not preclude SARS-Cov-2 infection and should not be used as the sole basis for treatment or other patient management decisions. A  negative result may occur with  improper specimen collection/handling, submission of specimen other than nasopharyngeal swab, presence of viral mutation(s) within the areas targeted by this assay, and inadequate number of viral copies(<138 copies/mL). A negative result must be combined with clinical observations, patient history, and epidemiological information. The expected result is Negative.  Fact Sheet for Patients:  BloggerCourse.com  Fact Sheet for Healthcare Providers:  SeriousBroker.it  This test is no t yet approved or cleared by the [place] FDA and  has been authorized for detection and/or diagnosis of SARS-CoV-2 by FDA under an Emergency Use Authorization (EUA). This EUA will remain  in effect (meaning this test can be used) for the duration of the COVID-19 declaration under Section 564(b)(1) of the Act, 21 U.S.C.section 360bbb-3(b)(1), unless the authorization is terminated  or revoked sooner.       Influenza A by PCR NEGATIVE NEGATIVE Final   Influenza B by PCR NEGATIVE NEGATIVE Final    Comment:  (NOTE) The Xpert Xpress SARS-CoV-2/FLU/RSV plus assay is intended as an aid in the diagnosis of influenza from Nasopharyngeal swab specimens and should not be used as a sole basis for treatment. Nasal washings and aspirates are unacceptable for Xpert Xpress SARS-CoV-2/FLU/RSV testing.  Fact Sheet for Patients: BloggerCourse.com  Fact Sheet for Healthcare Providers: SeriousBroker.it  This test is not yet approved or cleared by the [place] FDA and has been authorized for detection and/or diagnosis of SARS-CoV-2 by FDA under an Emergency Use Authorization (EUA). This EUA will remain in effect (meaning this test can be used) for the duration of the COVID-19 declaration under Section 564(b)(1) of the Act, 21 U.S.C. section 360bbb-3(b)(1), unless the authorization is terminated or revoked.     Resp Syncytial Virus by PCR NEGATIVE NEGATIVE Final    Comment: (NOTE) Fact Sheet for Patients: BloggerCourse.com  Fact Sheet for Healthcare Providers: SeriousBroker.it  This test is not yet approved or cleared by the [place] FDA and has been authorized for detection and/or diagnosis of SARS-CoV-2 by FDA under an Emergency Use Authorization (EUA). This EUA will remain in effect (meaning this test can be used) for the duration of the COVID-19 declaration under Section 564(b)(1) of the Act, 21 U.S.C. section 360bbb-3(b)(1), unless the authorization is terminated or revoked.  Performed at [facility], [address], [place], [place] [address]   MRSA Next Gen by PCR, Nasal     Status: None   Collection Time: 09/05/22 10:09 PM   Specimen: Nasal Mucosa; Nasal Swab  Result Value Ref Range Status   MRSA by PCR Next Gen NOT DETECTED NOT DETECTED Final    Comment: (NOTE) The GeneXpert MRSA Assay (FDA approved for NASAL specimens only), is one component of a  comprehensive MRSA colonization surveillance program. It is not intended to diagnose MRSA infection nor to guide or monitor treatment for MRSA infections. Test performance is not FDA approved in patients less than 9 years old. Performed at [facility], [address] [place], [place] [address]   Culture, blood (Routine X 2) w Reflex to ID Panel     Status: None (Preliminary result)   Collection Time: 09/07/22 10:06 AM   Specimen: BLOOD LEFT HAND  Result Value Ref Range Status   Specimen Description BLOOD LEFT HAND  Final   Special Requests   Final    BOTTLES DRAWN AEROBIC AND ANAEROBIC Blood Culture adequate volume   Culture   Final    NO GROWTH 2 DAYS Performed at [facility], [address], [place], [place] [address]    Report Status PENDING  Incomplete  Culture, blood (Routine X 2) w Reflex to ID Panel     Status: None (Preliminary result)   Collection Time: 09/07/22 10:06 AM   Specimen: BLOOD RIGHT HAND  Result Value Ref Range Status   Specimen Description BLOOD RIGHT HAND  Final   Special Requests   Final    BOTTLES DRAWN AEROBIC AND ANAEROBIC Blood Culture adequate volume   Culture   Final    NO GROWTH 2 DAYS Performed at [facility], [address] [place], [place] [address]    Report Status PENDING  Incomplete     Radiology Studies: No results found.   Scheduled Meds:  apixaban  5 mg Oral BID   aspirin EC  81 mg Oral Daily   buPROPion  150 mg Oral Daily   Chlorhexidine Gluconate Cloth  6 each Topical Daily   digoxin  0.125 mg Oral Daily   DULoxetine  30 mg Oral QHS   insulin pump   Subcutaneous TID WC, HS, 0200   levothyroxine  100 mcg Oral QAC breakfast   LORazepam  0.5 mg Oral QHS   mometasone-formoterol  2 puff Inhalation BID   montelukast  10 mg Oral QHS   neomycin-bacitracin-polymyxin  1 Application Apply externally BID   pantoprazole  40 mg Oral BID   phenazopyridine  100 mg Oral TID WC   sodium chloride flush  10-40 mL  Intracatheter Q12H   Continuous Infusions:  sodium chloride Stopped (09/07/22 1543)   cefTRIAXone (ROCEPHIN)  IV Stopped (09/09/22 0029)   promethazine (PHENERGAN) injection (IM or IVPB) Stopped (09/07/22 2203)     LOS: 4 days    Time spent:    Zannie Cove, MD [facility]   09/09/2022, 11:32 AM

## 2022-09-09 NOTE — Procedures (Signed)
IR requested to remove tunneled central line due to line no longer being needed. Written consent was obtained and the line was removed at the bedside. The site was cleaned/prepped with chlorhexadine. The suture was removed, the site was numbed with lidocaine and the catheter was removed intact. Patient tolerated procedure well. Pressure was held until hemostasis was achieved. Site was covered with gauze/tegaderm.   Alwyn Ren, [place] [phone] 09/09/2022, 11:11 AM

## 2022-09-09 NOTE — Progress Notes (Signed)
Patient states she would like to get another echocardiogram before having her central line removed. Also, pt says she would like to go back on to a carb modified diet as her stomach is feeling better.  Will continue to monitor.  Harriet Masson, RN

## 2022-09-09 NOTE — Progress Notes (Addendum)
  Patient reported she want to advance her diet and try carb consistent diet. -Changed clear liquid diet to carb consistent diet. Patient has history of DM type I and insulin pump is active.  Tereasa Coop, MD [facility] 09/09/2022, 3:57 AM

## 2022-09-09 NOTE — Progress Notes (Addendum)
Bedside nurse reports that patient complaining about severe dysuria and sensation of "feels like being jabbed with toothpicks up there."  She is requesting for some pain medication. -Starting pyridium 100 mg 3 times daily for 3 day and continue  tramadol 25 mg every 6 as needed for 1 day.   Tereasa Coop, MD [facility] 09/09/2022, 12:47 AM

## 2022-09-09 NOTE — Discharge Instructions (Signed)
 Information on my medicine - ELIQUIS (apixaban)  This medication education was reviewed with me or my healthcare representative as part of my discharge preparation.    Why was Eliquis prescribed for you? Eliquis was prescribed to treat blood clots that may have been found in the veins of your legs (deep vein thrombosis) or in your lungs (pulmonary embolism) and to reduce the risk of them occurring again.  What do You need to know about Eliquis ? Continue Eliquis 5 mg tablet taken TWICE daily.  Eliquis may be taken with or without food.   Try to take the dose about the same time in the morning and in the evening. If you have difficulty swallowing the tablet whole please discuss with your pharmacist how to take the medication safely.  Take Eliquis exactly as prescribed and DO NOT stop taking Eliquis without talking to the doctor who prescribed the medication.  Stopping may increase your risk of developing a new blood clot.  Refill your prescription before you run out.  After discharge, you should have regular check-up appointments with your healthcare provider that is prescribing your Eliquis.    What do you do if you miss a dose? If a dose of ELIQUIS is not taken at the scheduled time, take it as soon as possible on the same day and twice-daily administration should be resumed. The dose should not be doubled to make up for a missed dose.  Important Safety Information A possible side effect of Eliquis is bleeding. You should call your healthcare provider right away if you experience any of the following: Bleeding from an injury or your nose that does not stop. Unusual colored urine (red or dark brown) or unusual colored stools (red or black). Unusual bruising for unknown reasons. A serious fall or if you hit your head (even if there is no bleeding).  Some medicines may interact with Eliquis and might increase your risk of bleeding or clotting while on Eliquis. To help avoid this,  consult your healthcare provider or pharmacist prior to using any new prescription or non-prescription medications, including herbals, vitamins, non-steroidal anti-inflammatory drugs (NSAIDs) and supplements.  This website has more information on Eliquis (apixaban): http://[email or web address].com/eliquis/home     Follow with Primary MD Kirstie Peri, MD in 7 days   Get CBC, CMP, anemia panel, TSH, 2 view Chest X ray -  checked next visit with your primary MD    Activity: As tolerated with Full fall precautions use walker/cane & assistance as needed  Disposition Home    Diet: Heart Healthy    Special Instructions: If you have smoked or chewed Tobacco  in the last 2 yrs please stop smoking, stop any regular Alcohol  and or any Recreational drug use.  On your next visit with your primary care physician please Get Medicines reviewed and adjusted.  Please request your Prim.MD to go over all Hospital Tests and Procedure/Radiological results at the follow up, please get all Hospital records sent to your Prim MD by signing hospital release before you go home.  If you experience worsening of your admission symptoms, develop shortness of breath, life threatening emergency, suicidal or homicidal thoughts you must seek medical attention immediately by calling 911 or calling your MD immediately  if symptoms less severe.  You Must read complete instructions/literature along with all the possible adverse reactions/side effects for all the Medicines you take and that have been prescribed to you. Take any new Medicines after you have completely understood and accpet all the

## 2022-09-10 DIAGNOSIS — N179 Acute kidney failure, unspecified: Secondary | ICD-10-CM | POA: Diagnosis not present

## 2022-09-10 DIAGNOSIS — R3989 Other symptoms and signs involving the genitourinary system: Secondary | ICD-10-CM

## 2022-09-10 DIAGNOSIS — R7989 Other specified abnormal findings of blood chemistry: Secondary | ICD-10-CM | POA: Diagnosis not present

## 2022-09-10 DIAGNOSIS — I214 Non-ST elevation (NSTEMI) myocardial infarction: Secondary | ICD-10-CM | POA: Diagnosis not present

## 2022-09-10 DIAGNOSIS — R110 Nausea: Secondary | ICD-10-CM | POA: Diagnosis not present

## 2022-09-10 LAB — GLUCOSE, CAPILLARY
Glucose-Capillary: 117 mg/dL — ABNORMAL HIGH (ref 70–99)
Glucose-Capillary: 241 mg/dL — ABNORMAL HIGH (ref 70–99)
Glucose-Capillary: 180 mg/dL — ABNORMAL HIGH (ref 70–99)
Glucose-Capillary: 186 mg/dL — ABNORMAL HIGH (ref 70–99)

## 2022-09-10 LAB — BASIC METABOLIC PANEL
Anion gap: 9 (ref 5–15)
BUN: 8 mg/dL (ref 6–20)
CO2: 23 mmol/L (ref 22–32)
Calcium: 8.7 mg/dL — ABNORMAL LOW (ref 8.9–10.3)
Chloride: 105 mmol/L (ref 98–111)
Creatinine, Ser: 1.01 mg/dL — ABNORMAL HIGH (ref 0.44–1.00)
GFR, Estimated: >60 mL/min (ref >60)
Glucose, Bld: 135 mg/dL — ABNORMAL HIGH (ref 70–99)
Potassium: 4.2 mmol/L (ref 3.5–5.1)
Sodium: 137 mmol/L (ref 135–145)

## 2022-09-10 LAB — CBC
HCT: 32.5 % — ABNORMAL LOW (ref 36.0–46.0)
Hemoglobin: 10.6 g/dL — ABNORMAL LOW (ref 12.0–15.0)
MCH: 29.6 pg (ref 26.0–34.0)
MCHC: 32.6 g/dL (ref 30.0–36.0)
MCV: 90.8 fL (ref 80.0–100.0)
Platelets: 295 10*3/uL (ref 150–400)
RBC: 3.58 MIL/uL — ABNORMAL LOW (ref 3.87–5.11)
RDW: 15.9 % — ABNORMAL HIGH (ref 11.5–15.5)
WBC: 4.6 10*3/uL (ref 4.0–10.5)
nRBC: 0 % (ref 0.0–0.2)

## 2022-09-10 MED ORDER — ALBUTEROL SULFATE (2.5 MG/3ML) 0.083% IN NEBU: 3.0000 mL | INHALATION_SOLUTION | Freq: Four times a day (QID) | RESPIRATORY_TRACT | Status: DC | PRN

## 2022-09-10 NOTE — Progress Notes (Signed)
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  Beta hydroxybutyrate acid was mildly elevated at that time 1.48 and anion gap is normal.  She has noted to have elevated troponins up to 1484 started on heparin infusion and admitted at [facility].  She was admitted to the [facility] service and cardiology was consulted and she was transferred from the PCCM service to [facility] on 09/07/2022.  Her nausea vomiting has improved and she was having low-grade fevers so her PICC line has been removed that she is now being treated for urinary tract infection.  Assessment and Plan:  NSTEMI versus Demand Ischemia History of CAD status post three-vessel CABG with LIMA to LAD/ramus intermedius and RIMA to the obtuse marginal -Troponin peaked at 1484 and cardiology feels that this is secondary to demand ischemia and not ACS -Cards following and she had recent admission on 04/2022 with similar episode and elevated troponins -Patient had a cardiac catheterization May 23, 2022 which revealed a patent LIMA to the LAD and patent vein graft to the left circumflex and there is minimal disease in the RCA and she had diffuse disease beyond the LIMA graft versus the distal LAD are small caliber not amenable to intervention -Echo noted EF of 50-55% with grade 2 diastolic dysfunction and hypokinesis of inferior and septal wall -Last  cath 4/24 with significant multivessel disease, medical management was recommended then -Continue Aspirin 81 mg p.o. daily and cardiology can refer her to a outpatient cardiac rehab program; unable to use statin given allergy and she has not been on a beta-blocker on account of her low output and her chronic dobutamine is now weaned off   Nausea, vomiting, chills, improved Low-grade fevers with ? Line infection Suspected UTI -No leukocytosis, follow-up blood cultures, procalcitonin reassuring -WBC Trend: Recent Labs  Lab 08/31/22 1256 09/05/22 1221 09/06/22 0149 09/07/22 0451 09/08/22 0400 09/09/22 0340 09/10/22 0306  WBC 4.8 13.4* 10.5 9.7 7.9 5.5 4.6  -Flu and COVID PCR were negative -Abdominal exam is benign, CT abdomen pelvis on admission was unremarkable, UA benign on admission -Improving, advance diet -Repeat UA day before yesterday showed moderate hemoglobin, large leukocytes, 20 ketones, rare bacteria, greater than 50 WBCs however no urine cultures obtained but she was started on ceftriaxone given possible UTI symptoms, follow-up cultures -Tunneled Central PICC Line was removed on 8/17 and will monitor for now with [employer]. IF Cardiology desires PICC line to be replaced will defer them to order and replace -Continue to monitor for signs or symptoms of infection and she is also started on Pyridium 100 mg 3 times daily for 3 days -Continue to monitor and we will continue IV ceftriaxone for now to complete 5-day treatment   Chronic systolic CHF, Ischemic Cardiomyopathy -Previously felt to be end-stage on Dobutamine 3 mcg, EF was 20-25% in April, EF recovered to 45-50% on last echo 08/17/2022 -Cardiology Following -Dr. Tresa Endo and Dr. Jomarie Longs discussed with  heart failure team 8/15, Dr.Bensimhon recommended to Discontinue Dobutamine and Pull PICC,  -PICC Line removed 09/09/22 however Note from cardiology she was off of dobutamine as of 09/08/2022 with her heart rates being improved  however her PICC line was in place at that time and she is afebrile and cardiology had recommended if possible likely to keep in place now ?;  Will need to clarify with Cardiology in the a.m. given that her PICC line has been removed on 09/09/2022; See Above and defer to them if they want to replace Line -Continue Digoxin 0.125 mg daily -Strict I's and O's and daily weights -She is not on diuretics at baseline -Continue to monitor blood pressure per protocol and cardiology to touch base with HF regarding further management   Type 1 Diabetes Mellitus with associated Hyperglycemia and gastroparesis -Insulin Pump was malfunctioning, now fixed -Diabetes education coordinator consult appreciated -Continue monitor CBGs and glucose per protocol -CBG Trend and Glucose Trend: Recent Labs  Lab 09/09/22 0629 09/09/22 1441 09/09/22 1742 09/09/22 2156 09/10/22 0618 09/10/22 1132 09/10/22 1617  GLUCAP 89 222* 162* 86 117* 241* 180*   Recent Labs  Lab 08/31/22 1256 09/05/22 1221 09/06/22 0149 09/07/22 0830 09/08/22 0400 09/09/22 0340 09/10/22 0306  GLUCOSE 148* 173* 217* 209* 135* 101* 135*  -Abdominal pain is improved and diet has been advanced from a clear liquid diet to carb consistent diet  Mild AKI -Resolved with hydration -BUN/Cr Trend: Recent Labs  Lab 08/31/22 1256 09/05/22 1221 09/06/22 0149 09/07/22 0830 09/08/22 0400 09/09/22 0340 09/10/22 0306  BUN 14 23* 24* 16 17 12 8   CREATININE 0.98 1.16* 1.44* 1.16* 1.15* 1.28* 1.01*  -Avoid Nephrotoxic Medications, Contrast Dyes, Hypotension and Dehydration to Ensure Adequate Renal Perfusion and will need to Renally Adjust Meds -Continue to Monitor and Trend Renal Function carefully and repeat CMP in the AM    History of left IJ DVT -C/w Anticoagulation with Apixaban 5 mg po BID  Normocytic Anemia -Hgb/Hct Trend: Recent Labs  Lab 09/05/22 1221 09/05/22 1233 09/06/22 0149 09/07/22 0451 09/08/22 0400 09/09/22 0340  09/10/22 0306  HGB 12.9 13.9 11.5* 12.1 11.6* 10.8* 10.6*  HCT 37.3 41.0 34.2* 36.5 35.6* 31.7* 32.5*  MCV 87.1  --  89.1 89.9 90.1 91.6 90.8  -Check Anemia Panel in the AM -Continue to Monitor for S/Sx of Bleeding; No overt bleeding noted -Repeat CBC in the AM  Hypoalbuminemia -Patient's Albumin Trend: Recent Labs  Lab 08/17/22 1143 08/24/22 1206 08/31/22 1256 09/05/22 1221 09/08/22 0400 09/09/22 0340  ALBUMIN 3.1* 3.7 3.6 4.9 3.5 3.3*  -Continue to Monitor and Trend and repeat CMP in the AM    DVT prophylaxis: SCDs Start: 09/05/22 2215 apixaban (ELIQUIS) tablet 5 mg    Code Status: Full Code Family Communication: No family currently at bedside  Disposition Plan:  Level of care: Progressive Status is: Inpatient Remains inpatient appropriate because: Will need further clinical improvement and clearance by cardiology   Consultants:  PCCM transfer Cardiology  Procedures:  As delineated as above  Antimicrobials:  Anti-infectives (From admission, onward)    Start     Dose/Rate Route Frequency Ordered Stop   09/09/22 0000  cefTRIAXone (ROCEPHIN) 1 g in sodium chloride 0.9 % 100 mL IVPB        1 g 200 mL/hr over 30 Minutes Intravenous Every 24 hours 09/08/22 2306 09/13/22 2359       Subjective: Seen and examined at bedside and states that she is feeling much better and felt "more  of herself".  Denies any chest pain or shortness of breath.  States her abdominal discomfort is improved.  No other concerns or complaints at this time.  Objective: Vitals:   09/10/22 0815 09/10/22 0904 09/10/22 1136 09/10/22 1619  BP:  118/68 122/73 110/65  Pulse:  79 93 85  Resp:  20 20 16   Temp:  98.3 F (36.8 C) 98 F (36.7 C) 98.7 F (37.1 C)  TempSrc:  Oral Oral Oral  SpO2: 97% 99% 99% 100%  Weight:      Height:        Intake/Output Summary (Last 24 hours) at 09/10/2022 1826 Last data filed at 09/10/2022 1300 Gross per 24 hour  Intake 1200 ml  Output 1500 ml  Net -300  ml   Filed Weights   09/05/22 2215 09/06/22 2237 09/07/22 0400  Weight: 61.6 kg 61.7 kg 61 kg   Examination: Physical Exam:  Constitutional: Chronic ill-appearing Caucasian female in no acute distress Respiratory: Diminished to auscultation bilaterally, no wheezing, rales, rhonchi or crackles. Normal respiratory effort and patient is not tachypenic. No accessory muscle use.  Unlabored breathing Cardiovascular: RRR, no murmurs / rubs / gallops. S1 and S2 auscultated.  No appreciable extremity edema Abdomen: Soft, non-tender, non-distended. Bowel sounds positive.  GU: Deferred. Musculoskeletal: No clubbing / cyanosis of digits/nails. No joint deformity upper and lower extremities. Skin: No rashes, lesions, ulcers on limited skin evaluation. No induration; Warm and dry.  Neurologic: CN 2-12 grossly intact with no focal deficits. Romberg sign and cerebellar reflexes not assessed.  Psychiatric: Normal judgment and insight. Alert and oriented x 3. Normal mood and appropriate affect.   Data Reviewed: I have personally reviewed following labs and imaging studies  CBC: Recent Labs  Lab 09/06/22 0149 09/07/22 0451 09/08/22 0400 09/09/22 0340 09/10/22 0306  WBC 10.5 9.7 7.9 5.5 4.6  HGB 11.5* 12.1 11.6* 10.8* 10.6*  HCT 34.2* 36.5 35.6* 31.7* 32.5*  MCV 89.1 89.9 90.1 91.6 90.8  PLT 355 375 348 296 295   Basic Metabolic Panel: Recent Labs  Lab 09/06/22 0149 09/07/22 0830 09/08/22 0400 09/08/22 0743 09/09/22 0340 09/10/22 0306  NA 135 135 135  --  136 137  K 3.6 4.2 3.7  --  3.3* 4.2  CL 99 101 101  --  104 105  CO2 19* 22 22  --  24 23  GLUCOSE 217* 209* 135*  --  101* 135*  BUN 24* 16 17  --  12 8  CREATININE 1.44* 1.16* 1.15*  --  1.28* 1.01*  CALCIUM 9.3 9.0 9.0  --  8.7* 8.7*  MG 2.3  --   --  2.2  --   --   PHOS 6.1*  --   --   --   --   --    GFR: Estimated Creatinine Clearance: 58 mL/min (A) (by C-G formula based on SCr of 1.01 mg/dL (H)). Liver Function  Tests: Recent Labs  Lab 09/05/22 1221 09/08/22 0400 09/09/22 0340  AST 33 25 24  ALT 25 22 22   ALKPHOS 105 82 73  BILITOT 0.7 0.8 0.3  PROT 8.8* 6.8 6.1*  ALBUMIN 4.9 3.5 3.3*   Recent Labs  Lab 09/05/22 1221  LIPASE <10*   No results for input(s): "AMMONIA" in the last 168 hours. Coagulation Profile: No results for input(s): "INR", "PROTIME" in the last 168 hours. Cardiac Enzymes: No results for input(s): "CKTOTAL", "CKMB", "CKMBINDEX", "TROPONINI" in the last 168 hours. BNP (last 3 results) No results for  input(s): "PROBNP" in the last 8760 hours. HbA1C: No results for input(s): "HGBA1C" in the last 72 hours. CBG: Recent Labs  Lab 09/09/22 1742 09/09/22 2156 09/10/22 0618 09/10/22 1132 09/10/22 1617  GLUCAP 162* 86 117* 241* 180*   Lipid Profile: No results for input(s): "CHOL", "HDL", "LDLCALC", "TRIG", "CHOLHDL", "LDLDIRECT" in the last 72 hours. Thyroid Function Tests: No results for input(s): "TSH", "T4TOTAL", "FREET4", "T3FREE", "THYROIDAB" in the last 72 hours. Anemia Panel: No results for input(s): "VITAMINB12", "FOLATE", "FERRITIN", "TIBC", "IRON", "RETICCTPCT" in the last 72 hours. Sepsis Labs: Recent Labs  Lab 09/05/22 1231 09/05/22 1426 09/07/22 0830  PROCALCITON  --   --  <0.10  LATICACIDVEN 2.1* 1.2  --     Recent Results (from the past 240 hour(s))  Resp panel by RT-PCR (RSV, Flu A&B, Covid) Anterior Nasal Swab     Status: None   Collection Time: 09/05/22  1:24 PM   Specimen: Anterior Nasal Swab  Result Value Ref Range Status   SARS Coronavirus 2 by RT PCR NEGATIVE NEGATIVE Final    Comment: (NOTE) SARS-CoV-2 target nucleic acids are NOT DETECTED.  The SARS-CoV-2 RNA is generally detectable in upper respiratory specimens during the acute phase of infection. The lowest concentration of SARS-CoV-2 viral copies this assay can detect is 138 copies/mL. A negative result does not preclude SARS-Cov-2 infection and should not be used as the  sole basis for treatment or other patient management decisions. A negative result may occur with  improper specimen collection/handling, submission of specimen other than nasopharyngeal swab, presence of viral mutation(s) within the areas targeted by this assay, and inadequate number of viral copies(<138 copies/mL). A negative result must be combined with clinical observations, patient history, and epidemiological information. The expected result is Negative.  Fact Sheet for Patients:  BloggerCourse.com  Fact Sheet for Healthcare Providers:  SeriousBroker.it  This test is no t yet approved or cleared by the [place] FDA and  has been authorized for detection and/or diagnosis of SARS-CoV-2 by FDA under an Emergency Use Authorization (EUA). This EUA will remain  in effect (meaning this test can be used) for the duration of the COVID-19 declaration under Section 564(b)(1) of the Act, 21 U.S.C.section 360bbb-3(b)(1), unless the authorization is terminated  or revoked sooner.       Influenza A by PCR NEGATIVE NEGATIVE Final   Influenza B by PCR NEGATIVE NEGATIVE Final    Comment: (NOTE) The Xpert Xpress SARS-CoV-2/FLU/RSV plus assay is intended as an aid in the diagnosis of influenza from Nasopharyngeal swab specimens and should not be used as a sole basis for treatment. Nasal washings and aspirates are unacceptable for Xpert Xpress SARS-CoV-2/FLU/RSV testing.  Fact Sheet for Patients: BloggerCourse.com  Fact Sheet for Healthcare Providers: SeriousBroker.it  This test is not yet approved or cleared by the [place] FDA and has been authorized for detection and/or diagnosis of SARS-CoV-2 by FDA under an Emergency Use Authorization (EUA). This EUA will remain in effect (meaning this test can be used) for the duration of the COVID-19 declaration under Section 564(b)(1) of the  Act, 21 U.S.C. section 360bbb-3(b)(1), unless the authorization is terminated or revoked.     Resp Syncytial Virus by PCR NEGATIVE NEGATIVE Final    Comment: (NOTE) Fact Sheet for Patients: BloggerCourse.com  Fact Sheet for Healthcare Providers: SeriousBroker.it  This test is not yet approved or cleared by the [place] FDA and has been authorized for detection and/or diagnosis of SARS-CoV-2 by FDA under an Emergency Use Authorization (  EUA). This EUA will remain in effect (meaning this test can be used) for the duration of the COVID-19 declaration under Section 564(b)(1) of the Act, 21 U.S.C. section 360bbb-3(b)(1), unless the authorization is terminated or revoked.  Performed at [facility], [address] [place], [place] [address]   MRSA Next Gen by PCR, Nasal     Status: None   Collection Time: 09/05/22 10:09 PM   Specimen: Nasal Mucosa; Nasal Swab  Result Value Ref Range Status   MRSA by PCR Next Gen NOT DETECTED NOT DETECTED Final    Comment: (NOTE) The GeneXpert MRSA Assay (FDA approved for NASAL specimens only), is one component of a comprehensive MRSA colonization surveillance program. It is not intended to diagnose MRSA infection nor to guide or monitor treatment for MRSA infections. Test performance is not FDA approved in patients less than 54 years old. Performed at [facility], [address] [place], [place] [address]   Culture, blood (Routine X 2) w Reflex to ID Panel     Status: None (Preliminary result)   Collection Time: 09/07/22 10:06 AM   Specimen: BLOOD LEFT HAND  Result Value Ref Range Status   Specimen Description BLOOD LEFT HAND  Final   Special Requests   Final    BOTTLES DRAWN AEROBIC AND ANAEROBIC Blood Culture adequate volume   Culture   Final    NO GROWTH 3 DAYS Performed at [facility], [address] [place], [place] [address]    Report Status  PENDING  Incomplete  Culture, blood (Routine X 2) w Reflex to ID Panel     Status: None (Preliminary result)   Collection Time: 09/07/22 10:06 AM   Specimen: BLOOD RIGHT HAND  Result Value Ref Range Status   Specimen Description BLOOD RIGHT HAND  Final   Special Requests   Final    BOTTLES DRAWN AEROBIC AND ANAEROBIC Blood Culture adequate volume   Culture   Final    NO GROWTH 3 DAYS Performed at [facility], [address] [place], [place] [address]    Report Status PENDING  Incomplete    Radiology Studies: No results found.  Scheduled Meds:  apixaban  5 mg Oral BID   aspirin EC  81 mg Oral Daily   buPROPion  150 mg Oral Daily   digoxin  0.125 mg Oral Daily   DULoxetine  30 mg Oral QHS   insulin pump   Subcutaneous TID WC, HS, 0200   levothyroxine  100 mcg Oral QAC breakfast   LORazepam  0.5 mg Oral QHS   mometasone-formoterol  2 puff Inhalation BID   montelukast  10 mg Oral QHS   neomycin-bacitracin-polymyxin  1 Application Apply externally BID   pantoprazole  40 mg Oral BID   sodium chloride flush  10-40 mL Intracatheter Q12H   Continuous Infusions:  sodium chloride Stopped (09/07/22 1543)   cefTRIAXone (ROCEPHIN)  IV 1 g (09/10/22 0100)   promethazine (PHENERGAN) injection (IM or IVPB) Stopped (09/07/22 2203)    LOS: 5 days   Marguerita Merles, DO [facility] Available via Epic secure chat 7am-7pm After these hours, please refer to coverage provider listed on [email or web address] 09/10/2022, 6:26 PM

## 2022-09-10 NOTE — Hospital Course (Addendum)
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  Beta hydroxybutyrate acid was mildly elevated at that time 1.48 and anion gap is normal.  She has noted to have elevated troponins up to 1484 started on heparin infusion and admitted at [facility].  She was admitted to the [facility] service and cardiology was consulted and she was transferred from the PCCM service to [facility] on 09/07/2022.  Her nausea vomiting has improved and she was having low-grade fevers so her PICC line has been removed that she is now being treated for urinary tract infection.  Assessment and Plan:  NSTEMI versus Demand Ischemia History of CAD status post three-vessel CABG with LIMA to LAD/ramus intermedius and RIMA to the obtuse marginal -Troponin peaked at 1484 and cardiology feels that this is secondary to demand ischemia and not ACS -Cards following and she had recent admission on 04/2022 with similar episode and elevated troponins -Patient had a cardiac catheterization May 23, 2022 which revealed a patent LIMA to the LAD and patent vein graft to the left circumflex and there is minimal disease in the RCA and she had diffuse disease beyond the LIMA graft versus the distal LAD are small caliber not amenable to intervention -Echo noted EF of 50-55% with grade 2 diastolic dysfunction and hypokinesis of inferior and septal wall -Last cath 4/24 with significant multivessel disease, medical management was recommended then -Continue Aspirin 81 mg p.o. daily and  cardiology can refer her to a outpatient cardiac rehab program; unable to use statin given allergy and she has not been on a beta-blocker on account of her low output and her chronic dobutamine is now weaned off   Nausea, vomiting, chills, improved Low-grade fevers with ? Line infection vs Suspected UTI -No leukocytosis, follow-up blood cultures, procalcitonin reassuring -WBC Trend: Recent Labs  Lab 08/31/22 1256 09/05/22 1221 09/06/22 0149 09/07/22 0451 09/08/22 0400 09/09/22 0340 09/10/22 0306  WBC 4.8 13.4* 10.5 9.7 7.9 5.5 4.6  -Flu and COVID PCR were negative -Abdominal exam is benign, CT abdomen pelvis on admission was unremarkable, UA benign on admission -Improving, advance diet -Repeat UA day before yesterday showed moderate hemoglobin, large leukocytes, 20 ketones, rare bacteria, greater than 50 WBCs however no urine cultures obtained but she was started on ceftriaxone given possible UTI symptoms, follow-up cultures -Tunneled Central PICC Line was removed on 8/17 and will monitor for now with [employer]. IF Cardiology desires PICC line to be replaced will defer them to order and replace -Continue to monitor for signs or symptoms of infection and she is also started on Pyridium 100 mg 3 times daily for 3 dosese -Blood cultures x 2 showed no growth to date at 3 days -Continue to monitor and we will continue IV ceftriaxone for now to complete 5-day treatment -Continue with supportive care and antiemetics with IV/p.o. ondansetron every 6 as needed for nausea vomiting and Phenergan 12.5 mg every 6 as needed for nausea vomiting   Chronic systolic CHF, Ischemic Cardiomyopathy -Previously felt to be end-stage on Dobutamine 3 mcg, EF was 20-25% in April, EF  recovered to 45-50% on last echo 08/17/2022 -Cardiology Following -Dr. Tresa Endo and Dr. Jomarie Longs discussed with heart failure team 8/15, Dr.Bensimhon recommended to Discontinue Dobutamine and Pull PICC,  -PICC Line removed 09/09/22  however Note from cardiology she was off of dobutamine as of 09/08/2022 with her heart rates being improved however her PICC line was in place at that time and she is afebrile and cardiology had recommended if possible likely to keep in place now ?;  Will need to clarify with Cardiology in the a.m. given that her PICC line has been removed on 09/09/2022; See Above and defer to them if they want to replace Line -Continue Digoxin 0.125 mg daily -Strict I's and O's and daily weights -She is not on diuretics at baseline -Continue to monitor blood pressure per protocol and cardiology to touch base with HF regarding further management  Hx of Asthma and Bronchospasm -C/w Albuterol Inhaler, Mometasone-Formoterol 100-5 mcg/ACT 2 puff IH and Monteklukast 10 mg po qHS   Type 1 Diabetes Mellitus with associated Hyperglycemia and gastroparesis -Insulin Pump was malfunctioning, now fixed -Diabetes education coordinator consult appreciated -Continue monitor CBGs and glucose per protocol -CBG Trend and Glucose Trend: Recent Labs  Lab 09/09/22 0629 09/09/22 1441 09/09/22 1742 09/09/22 2156 09/10/22 0618 09/10/22 1132 09/10/22 1617  GLUCAP 89 222* 162* 86 117* 241* 180*   Recent Labs  Lab 08/31/22 1256 09/05/22 1221 09/06/22 0149 09/07/22 0830 09/08/22 0400 09/09/22 0340 09/10/22 0306  GLUCOSE 148* 173* 217* 209* 135* 101* 135*  -Abdominal pain is improved and diet has been advanced from a clear liquid diet to carb consistent diet  Mild AKI -Resolved with hydration -BUN/Cr Trend: Recent Labs  Lab 08/31/22 1256 09/05/22 1221 09/06/22 0149 09/07/22 0830 09/08/22 0400 09/09/22 0340 09/10/22 0306  BUN 14 23* 24* 16 17 12 8   CREATININE 0.98 1.16* 1.44* 1.16* 1.15* 1.28* 1.01*  -Avoid Nephrotoxic Medications, Contrast Dyes, Hypotension and Dehydration to Ensure Adequate Renal Perfusion and will need to Renally Adjust Meds -Continue to Monitor and Trend Renal Function carefully and repeat  CMP in the AM    History of left IJ DVT -C/w Anticoagulation with Apixaban 5 mg po BID  Normocytic Anemia -Hgb/Hct Trend: Recent Labs  Lab 09/05/22 1221 09/05/22 1233 09/06/22 0149 09/07/22 0451 09/08/22 0400 09/09/22 0340 09/10/22 0306  HGB 12.9 13.9 11.5* 12.1 11.6* 10.8* 10.6*  HCT 37.3 41.0 34.2* 36.5 35.6* 31.7* 32.5*  MCV 87.1  --  89.1 89.9 90.1 91.6 90.8  -Check Anemia Panel in the AM -Continue to Monitor for S/Sx of Bleeding; No overt bleeding noted -Repeat CBC in the AM  Hypoalbuminemia -Patient's Albumin Trend: Recent Labs  Lab 08/17/22 1143 08/24/22 1206 08/31/22 1256 09/05/22 1221 09/08/22 0400 09/09/22 0340  ALBUMIN 3.1* 3.7 3.6 4.9 3.5 3.3*  -Continue to Monitor and Trend and repeat CMP in the AM

## 2022-09-11 ENCOUNTER — Other Ambulatory Visit (HOSPITAL_COMMUNITY): Payer: Self-pay

## 2022-09-11 DIAGNOSIS — I214 Non-ST elevation (NSTEMI) myocardial infarction: Secondary | ICD-10-CM | POA: Diagnosis not present

## 2022-09-11 LAB — GLUCOSE, CAPILLARY
Glucose-Capillary: 151 mg/dL — ABNORMAL HIGH (ref 70–99)
Glucose-Capillary: 206 mg/dL — ABNORMAL HIGH (ref 70–99)

## 2022-09-11 MED ORDER — CEFDINIR 300 MG PO CAPS
300.0000 mg | ORAL_CAPSULE | Freq: Two times a day (BID) | ORAL | 0 refills | Status: AC
Start: 2022-09-11 — End: 2022-09-14
  Filled 2022-09-11: qty 6, 3d supply, fill #0

## 2022-09-11 NOTE — TOC Progression Note (Signed)
Transition of Care [facility]) - Progression Note    Patient Details  Name: EMMELIA MELERINE MRN: [id number] Date of Birth: 02/21/1970  Transition of Care [facility]) CM/SW Contact  Elliot Cousin, RN Phone Number: [phone] 09/11/2022, 11:08 AM  Clinical Narrative:   CM contacted [employer] rep, Eber Jones for [facility] RN. Contacted [facility] rep, Lanora Manis to make aware of dc home without Dobutamine. They will contact her about their equipment.   Has PCP September 20, 2022 at 1000 am for hospital follow up appointment with Georgian Co PA      Expected Discharge Plan: Home w Home Health Services Barriers to Discharge: No Barriers Identified  Expected Discharge Plan and Services   Discharge Planning Services: CM Consult Post Acute Care Choice: Home Health Living arrangements for the past 2 months: Single Family Home Expected Discharge Date: 09/11/22                           [facility] Agency: [facility] ([facility]) Date [facility] Agency Contacted: 09/11/22 Time HH Agency Contacted: 1106 Representative spoke with at [facility] Agency: Lanora Manis, [facility] rep, [employer] rep   Social Determinants of Health (SDOH) Interventions SDOH Screenings   Food Insecurity: No Food Insecurity (05/26/2022)  Recent Concern: Food Insecurity - Food Insecurity Present (05/15/2022)  Housing: Low Risk  (05/26/2022)  Recent Concern: Housing - High Risk (05/15/2022)  Transportation Needs: No Transportation Needs (05/26/2022)  Recent Concern: Transportation Needs - Unmet Transportation Needs (05/15/2022)  Utilities: Not At Risk (05/26/2022)  Alcohol Screen: Low Risk  (10/05/2020)  Depression (PHQ2-9): Low Risk  (06/09/2022)  Financial Resource Strain: High Risk (05/15/2022)  Physical Activity: Unknown (05/15/2022)  Social Connections: Socially Isolated (05/15/2022)  Stress: Stress Concern Present (05/15/2022)  Tobacco Use: Medium Risk (08/23/2022)    Readmission Risk Interventions    05/31/2022   11:59  AM 06/01/2021   10:05 AM  Readmission Risk Prevention Plan  Transportation Screening Complete Complete  PCP or Specialist Appt within 3-5 Days  --  HRI or Home Care Consult  Complete  Social Work Consult for Recovery Care Planning/Counseling  Complete  Palliative Care Screening  Not Applicable  Medication Review Oceanographer) Complete Complete  PCP or Specialist appointment within 3-5 days of discharge Not Complete   PCP/Specialist Appt Not Complete comments follow up 06/09/22   HRI or Home Care Consult Complete   SW Recovery Care/Counseling Consult Complete   Palliative Care Screening Not Applicable   Skilled Nursing Facility Not Applicable

## 2022-09-11 NOTE — Progress Notes (Signed)
PT Cancellation Note  Patient Details Name: Jennifer Hogan MRN: [id number] DOB: 10-May-1970   Cancelled Treatment:    Reason Eval/Treat Not Completed: PT screened, no needs identified, will sign off Reports walked with OT and feels no current PT needs, HOWEVER she would love referral for outpatient cardiac rehab.    Elray Mcgregor 09/11/2022, 10:09 AM Sheran Lawless, PT Acute Rehabilitation Services Office:[phone] 09/11/2022

## 2022-09-11 NOTE — Plan of Care (Signed)
Discharge instructions discussed with patient.  Patient instructed on home medications, restrictions, and follow up appointments. Belongings gathered and sent with patient.  Patients medications received from [facility] and given to the patient.

## 2022-09-11 NOTE — Discharge Summary (Signed)
Physician Discharge Summary  Jennifer Hogan NUU:[id number] DOB: [birth year] DOA: 09/05/2022  PCP: [name], NP  Admit date: 09/05/2022 Discharge date: 09/11/2022  Time spent: 45 minutes  Recommendations for Outpatient Follow-up:  Advanced heart failure clinic in 2 weeks PCP in 1 week, please check BMP at follow-up   Discharge Diagnoses:  Principal Problem: Elevated troponin Nausea vomiting UTI Chronic systolic CHF CABG Type 1 diabetes mellitus Chronic anemia Anxiety Hypothyroidism Left IJ DVT   AKI (acute kidney injury) (HCC)   Elevated troponin   Nausea   Discharge Condition: Improved  Diet recommendation: Low-salt a.m., diabetic  Filed Weights   09/06/22 2237 09/07/22 0400 09/11/22 0535  Weight: 61.7 kg 61 kg 62.6 kg    History of present illness:  51/F with history of chronic systolic CHF, ischemic cardiomyopathy on home dobutamine, CAD/CABG, type 1 diabetes mellitus, chronic anemia, anxiety, hypothyroidism, left IJ DVT on Eliquis presented to [facility], ED on 8/13 with generalized weakness, constipation and nausea, in the ED blood glucose was 167, CO2 21, beta hydroxybutyrate acid mildly elevated at 1.48, anion gap was normal, troponin trended up to 1216, she was started on a heparin infusion and admitted to [facility], ICU, cards consulted and following -Transferred from PCCM to [facility] service 8/15  Hospital Course:   NSTEMI versus demand ischemia -Troponin peaked at 1484 -Echo noted EF of 50-55% with grade 2 diastolic dysfunction and hypokinesis of inferior and septal wall -Followed by cardiology this admission, no further workup felt to be indicated at this time -Last cath 4/24 with significant multivessel disease, medical management was recommended then -Continue aspirin, allergic to statins, not on beta-blocker, previously on dobutamine which was discontinued this admission, follow-up with cards, consider low-dose beta-blocker down the road   Nausea,  vomiting, chills Low-grade fevers ? UTI -Blood cultures are negative -Flu and COVID PCR were negative -Abdominal exam is benign, CT abdomen pelvis on admission was unremarkable, UA benign on admission -Improving, advance diet -Then urinary symptoms 2 days ago repeat UA with increased WBCs, rare bacteria only, started on ceftriaxone given possible UTI symptoms, urine cultures pending, transition to oral cefdinir at discharge -Also removed tunneled central line -Symptoms have resolved   Chronic systolic CHF, ischemic cardiomyopathy -Previously felt to be end-stage on dobutamine 3 mcg, EF was 20-25% in April, EF recovered to 45-50% on last echo 08/17/2022 -Cards following, dobutamine dose was decreased to 1.5 mcg on account of persistent tachycardia -Discussed with heart failure team 8/15, Dr.Bensimhon recommended to discontinued Dobutamine, as she was not felt to need this any longer -Central line removed 8/17 -Continue digoxin -She is not on diuretics at baseline -Follow-up heart failure clinic   Type 1 diabetes mellitus Hyperglycemia -Pump was malfunctioning, now fixed -Consult diabetes coordinator appreciated   Mild AKI -Resolved with hydration   History of DVT -Eliquis resumed   Discharge Exam: Vitals:   09/11/22 0805 09/11/22 0842  BP:  131/76  Pulse:    Resp:  12  Temp:  99 F (37.2 C)  SpO2: 99% 99%   Gen: Awake, Alert, Oriented X 3,  HEENT: no JVD Lungs: Good air movement bilaterally, CTAB CVS: S1S2/RRR Abd: soft, Non tender, non distended, BS present Extremities: No edema Skin: no new rashes on exposed skin   Discharge Instructions   Discharge Instructions     Diet - low sodium heart healthy   Complete by: As directed    Diet Carb Modified   Complete by: As directed    Increase  activity slowly   Complete by: As directed       Allergies as of 09/11/2022       Reactions   Crestor [rosuvastatin Calcium] Other (See Comments)   Myalgias  Arthralgias     Lipitor [atorvastatin] Other (See Comments)   Myalgias    Monosodium Glutamate Nausea And Vomiting, Other (See Comments)   MSG - migraine   Zetia [ezetimibe] Other (See Comments)   Myalgias    Ciprofloxin Hcl [ciprofloxacin] Nausea And Vomiting, Other (See Comments)   Severe migraine   Food Color Red [red Dye #40 (allura Red)] Diarrhea        Medication List     STOP taking these medications    DOBUTamine 4-5 MG/ML-% infusion Commonly known as: DOBUTREX   losartan 25 MG tablet Commonly known as: COZAAR   meloxicam 15 MG tablet Commonly known as: MOBIC   Praluent 150 MG/ML Soaj Generic drug: Alirocumab       TAKE these medications    acetaminophen 500 MG tablet Commonly known as: TYLENOL Take 1,500 mg by mouth as needed for moderate pain.   aspirin EC 81 MG tablet Take 1 tablet (81 mg total) by mouth daily. What changed: when to take this   Baqsimi One Pack 3 MG/DOSE Powd Generic drug: Glucagon Place 3 mg into the nose once as needed for up to 1 dose.   buPROPion 150 MG 24 hr tablet Commonly known as: Wellbutrin XL Take 1 tablet (150 mg total) by mouth every morning.   cefdinir 300 MG capsule Commonly known as: OMNICEF Take 1 capsule (300 mg total) by mouth 2 (two) times daily for 3 days.   Dexcom G6 Sensor Misc Use as instructed to check glucose levels. Change every 10 days.   Dexcom G6 Transmitter Misc Use as instructed to check glucose levels. Change every 90 days.   digoxin 0.125 MG tablet Commonly known as: LANOXIN Take 1 tablet (0.125 mg total) by mouth daily.   diphenhydrAMINE 25 MG tablet Commonly known as: BENADRYL Take 37.5 mg by mouth at bedtime.   Dulera 100-5 MCG/ACT Aero Generic drug: mometasone-formoterol Inhale 2 puffs into the lungs 2 (two) times daily.   DULoxetine 30 MG capsule Commonly known as: Cymbalta Take 1 capsule (30 mg total) by mouth at bedtime.   Eliquis 5 MG Tabs tablet Generic drug: apixaban Take 1 tablet  (5 mg total) by mouth 2 (two) times daily.   fluticasone 50 MCG/ACT nasal spray Commonly known as: FLONASE Insert 1 spray each nostril daily for 3 days then use 2-3 times a week as needed What changed:  how much to take how to take this when to take this additional instructions   HumaLOG 100 UNIT/ML injection Generic drug: insulin lispro Inject 0.35 mLs (35 Units total) into the skin daily via pump. What changed:  when to take this additional instructions   hydrOXYzine 25 MG tablet Commonly known as: ATARAX Take 25 mg by mouth as needed for anxiety.   levothyroxine 100 MCG tablet Commonly known as: SYNTHROID Take 1 tablet (100 mcg total) by mouth daily before breakfast.   metoCLOPramide 5 MG tablet Commonly known as: Reglan Take 1 tablet (5 mg total) by mouth every 8 (eight) hours as needed for nausea or vomiting. What changed:  how much to take when to take this   montelukast 10 MG tablet Commonly known as: SINGULAIR Take 1 tablet (10 mg total) by mouth at bedtime.   multivitamin with minerals Tabs tablet Take  1 tablet by mouth daily with lunch.   nitroGLYCERIN 0.4 MG SL tablet Commonly known as: NITROSTAT Place 1 tablet (0.4 mg total) under the tongue every 5 (five) minutes as needed for chest pain.   Omnipod 5 G6 Pods (Gen 5) Misc Use every 3 (three) days.   ondansetron 4 MG tablet Commonly known as: Zofran Take 1 tablet (4 mg total) by mouth daily as needed for nausea or vomiting.   OVER THE COUNTER MEDICATION Take 3 tablets by mouth in the morning. Super Lysine   pantoprazole 40 MG tablet Commonly known as: PROTONIX Take 1 tablet (40 mg total) by mouth 2 (two) times daily.   Ventolin HFA 108 (90 Base) MCG/ACT inhaler Generic drug: albuterol INHALE 2 PUFFS INTO THE LUNGS EVERY 6 (SIX) HOURS AS NEEDED FOR WHEEZING OR SHORTNESS OF BREATH. What changed: when to take this       Allergies  Allergen Reactions   Crestor [Rosuvastatin Calcium] Other  (See Comments)    Myalgias  Arthralgias    Lipitor [Atorvastatin] Other (See Comments)    Myalgias    Monosodium Glutamate Nausea And Vomiting and Other (See Comments)    MSG - migraine   Zetia [Ezetimibe] Other (See Comments)    Myalgias    Ciprofloxin Hcl [Ciprofloxacin] Nausea And Vomiting and Other (See Comments)    Severe migraine   Food Color Red [Red Dye #40 (Allura Red)] Diarrhea    Follow-up Information     Georgian Co M, PA-C Follow up.   Specialty: Family Medicine Why: September 20, 2022 at 1000 am for hospital follow up appointment Contact information: [address] [place] [address] [phone]                  The results of significant diagnostics from this hospitalization (including imaging, microbiology, ancillary and laboratory) are listed below for reference.    Significant Diagnostic Studies: ECHOCARDIOGRAM COMPLETE  Result Date: 09/06/2022    ECHOCARDIOGRAM REPORT   Patient Name:   Jennifer Hogan Date of Exam: 09/06/2022 Medical Rec #:  [id number]         Height:       65.0 in Accession #:    [id number]        Weight:       135.8 lb Date of Birth:  [birth year]        BSA:          1.678 m Patient Age:    51 years          BP:           109/66 mmHg Patient Gender: F                 HR:           87 bpm. Exam Location:  Inpatient Procedure: 2D Echo, Color Doppler and Cardiac Doppler Indications:    chest pain  History:        Patient has prior history of Echocardiogram examinations, most                 recent 08/17/2022. History of Takotsubo, CAD, Prior CABG; Risk                 Factors:Dyslipidemia and Diabetes.  Sonographer:    Delcie Roch RDCS Referring Phys: [id number] [facility] B ROBERTS  Sonographer Comments: Image acquisition challenging due to respiratory motion. IMPRESSIONS  1. Severe hypokinesis of the inferior and septal walls with overall low  normal LV function.  2. Left ventricular ejection fraction, by estimation, is 50 to  55%. The left ventricle has low normal function. The left ventricle demonstrates regional wall motion abnormalities (see scoring diagram/findings for description). Left ventricular diastolic  parameters are consistent with Grade II diastolic dysfunction (pseudonormalization).  3. Right ventricular systolic function is normal. The right ventricular size is normal.  4. The mitral valve is normal in structure. No evidence of mitral valve regurgitation. No evidence of mitral stenosis.  5. The aortic valve is tricuspid. Aortic valve regurgitation is not visualized. No aortic stenosis is present.  6. The inferior vena cava is normal in size with greater than 50% respiratory variability, suggesting right atrial pressure of 3 mmHg. FINDINGS  Left Ventricle: Left ventricular ejection fraction, by estimation, is 50 to 55%. The left ventricle has low normal function. The left ventricle demonstrates regional wall motion abnormalities. The left ventricular internal cavity size was normal in size. There is no left ventricular hypertrophy. Left ventricular diastolic parameters are consistent with Grade II diastolic dysfunction (pseudonormalization). Right Ventricle: The right ventricular size is normal. No increase in right ventricular wall thickness. Right ventricular systolic function is normal. Left Atrium: Left atrial size was normal in size. Right Atrium: Right atrial size was normal in size. Pericardium: There is no evidence of pericardial effusion. Mitral Valve: The mitral valve is normal in structure. No evidence of mitral valve regurgitation. No evidence of mitral valve stenosis. Tricuspid Valve: The tricuspid valve is normal in structure. Tricuspid valve regurgitation is trivial. No evidence of tricuspid stenosis. Aortic Valve: The aortic valve is tricuspid. Aortic valve regurgitation is not visualized. No aortic stenosis is present. Pulmonic Valve: The pulmonic valve was normal in structure. Pulmonic valve regurgitation  is not visualized. No evidence of pulmonic stenosis. Aorta: The aortic root is normal in size and structure. Venous: The inferior vena cava is normal in size with greater than 50% respiratory variability, suggesting right atrial pressure of 3 mmHg. IAS/Shunts: No atrial level shunt detected by color flow Doppler. Additional Comments: Severe hypokinesis of the inferior and septal walls with overall low normal LV function.  LEFT VENTRICLE PLAX 2D LVIDd:         4.50 cm     Diastology LVIDs:         3.50 cm     LV e' medial:    5.33 cm/s LV PW:         0.80 cm     LV E/e' medial:  17.6 LV IVS:        0.90 cm     LV e' lateral:   8.70 cm/s LVOT diam:     1.90 cm     LV E/e' lateral: 10.8 LV SV:         44 LV SV Index:   26 LVOT Area:     2.84 cm  LV Volumes (MOD) LV vol d, MOD A4C: 67.4 ml LV vol s, MOD A4C: 33.3 ml LV SV MOD A4C:     67.4 ml RIGHT VENTRICLE             IVC RV Basal diam:  2.30 cm     IVC diam: 1.10 cm RV S prime:     10.60 cm/s TAPSE (M-mode): 1.3 cm LEFT ATRIUM             Index        RIGHT ATRIUM          Index LA Vol (A2C):  33.9 ml 20.20 ml/m  RA Area:     9.33 cm LA Vol (A4C):   26.1 ml 15.55 ml/m  RA Volume:   18.90 ml 11.26 ml/m LA Biplane Vol: 30.2 ml 18.00 ml/m  AORTIC VALVE LVOT Vmax:   89.50 cm/s LVOT Vmean:  58.300 cm/s LVOT VTI:    0.155 m  AORTA Ao Root diam: 2.90 cm Ao Asc diam:  2.90 cm MITRAL VALVE MV Area (PHT): 4.80 cm    SHUNTS MV Decel Time: 158 msec    Systemic VTI:  0.16 m MV E velocity: 93.60 cm/s  Systemic Diam: 1.90 cm MV A velocity: 54.20 cm/s MV E/A ratio:  1.73 Olga Millers MD Electronically signed by Olga Millers MD Signature Date/Time: 09/06/2022/5:16:45 PM    Final    CT ABDOMEN PELVIS W CONTRAST  Result Date: 09/05/2022 CLINICAL DATA:  Constipation, vomiting EXAM: CT ABDOMEN AND PELVIS WITH CONTRAST TECHNIQUE: Multidetector CT imaging of the abdomen and pelvis was performed using the standard protocol following bolus administration of intravenous  contrast. RADIATION DOSE REDUCTION: This exam was performed according to the departmental dose-optimization program which includes automated exposure control, adjustment of the mA and/or kV according to patient size and/or use of iterative reconstruction technique. CONTRAST:  70mL OMNIPAQUE IOHEXOL 300 MG/ML  SOLN COMPARISON:  05/20/2022 FINDINGS: Lower chest: Small linear densities are seen in left lower lung field, possibly scarring. Hepatobiliary: No focal abnormalities are seen in liver. Gallbladder is unremarkable. There is no dilation of bile ducts. Pancreas: No focal abnormalities are seen. Spleen: Unremarkable. Adrenals/Urinary Tract: Adrenals are unremarkable. There is no hydronephrosis. There are no renal or ureteral stones. There is a 3 mm low-density in the lower pole of right kidney, possibly a cyst. Urinary bladder is unremarkable. Stomach/Bowel: Small hiatal hernia is seen. Small bowel loops are not dilated. Appendix is not seen. There is no pericecal inflammation. There is no significant wall thickening in colon. There is no pericolic stranding. Vascular/Lymphatic: Scattered arterial calcifications are seen in aorta and its major branches. Reproductive: Unremarkable. Other: There is no ascites or pneumoperitoneum. Musculoskeletal: Degenerative changes are noted in lumbar spine, most severe at L3-L4 and L4-L5 levels with spinal stenosis and encroachment of neural foramina. No interval changes are noted. IMPRESSION: No acute findings are seen in CT scan of abdomen and pelvis. There is no evidence of intestinal obstruction or pneumoperitoneum. There is no hydronephrosis. Small hiatal hernia. Tiny right renal cyst. Aortic arteriosclerosis. Lumbar spondylosis. Electronically Signed   By: Ernie Avena M.D.   On: 09/05/2022 15:30   DG Chest Port 1 View  Result Date: 09/05/2022 CLINICAL DATA:  Chest discomfort EXAM: PORTABLE CHEST 1 VIEW COMPARISON:  CXR 06/27/22 FINDINGS: No pleural effusion. No  pneumothorax. Status post median sternotomy. No radiographically apparent displaced rib fractures. Visualized upper abdomen is unremarkable. No focal airspace opacity. Right-sided central venous catheter with the tip in the lower SVC. Degenerative changes of the bilateral glenohumeral joints. IMPRESSION: No focal airspace opacity Electronically Signed   By: Lorenza Cambridge M.D.   On: 09/05/2022 13:30   DG Wrist Complete Right  Result Date: 08/24/2022 CLINICAL DATA:  Scaphoid area pain. EXAM: RIGHT WRIST - COMPLETE 3+ VIEW COMPARISON:  None Available. FINDINGS: There is no acute fracture or dislocation. Bony alignment is normal. The joint spaces are preserved. A small ossific fragment along the lateral margin of the distal radius may reflect sequela of prior trauma. There is no erosive change. The soft tissues are unremarkable. IMPRESSION: 1. No acute  fracture or significant degenerative change in the wrist. 2. Small ossific fragment adjacent to the distal radius may reflect sequela of prior trauma. Electronically Signed   By: Lesia Hausen M.D.   On: 08/24/2022 13:36   ECHOCARDIOGRAM COMPLETE  Result Date: 08/17/2022    ECHOCARDIOGRAM REPORT   Patient Name:   Jennifer Hogan Date of Exam: 08/17/2022 Medical Rec #:  [id number]         Height:       62.0 in Accession #:    [id number]        Weight:       128.0 lb Date of Birth:  1970/09/16        BSA:          1.581 m Patient Age:    51 years          BP:           136/77 mmHg Patient Gender: F                 HR:           84 bpm. Exam Location:  Outpatient Procedure: 2D Echo, Cardiac Doppler and Color Doppler Indications:    CHF - Acute Systolic  History:        Patient has prior history of Echocardiogram examinations, most                 recent 05/22/2022. Cardiomyopathy, CHF and ICM, DCM, Takotsubo,                 Previous Myocardial Infarction and CAD, Prior CABG; Risk                 Factors:Diabetes, Dyslipidemia and Former Smoker. Respiratory                  failure.  Sonographer:    Wallie Char Referring Phys: [id number] DANIEL R BENSIMHON IMPRESSIONS  1. Left ventricular ejection fraction, by estimation, is 45 to 50%. The left ventricle has mildly decreased function. The anteroseptal walls and apex appear hypokinetic. Left ventricular diastolic parameters are indeterminant.  2. Right ventricular systolic function is normal. The right ventricular size is normal. There is normal pulmonary artery systolic pressure. The estimated right ventricular systolic pressure is 29.2 mmHg.  3. The mitral valve is normal in structure. Trivial mitral valve regurgitation.  4. The aortic valve is tricuspid. Aortic valve regurgitation is not visualized. No aortic stenosis is present.  5. The inferior vena cava is normal in size with greater than 50% respiratory variability, suggesting right atrial pressure of 3 mmHg. Comparison(s): Compared to prior TTE on 04/2022, the LVEF has improved significantly from 20-25% to 45-50% with improved wall motion. FINDINGS  Left Ventricle: Left ventricular ejection fraction, by estimation, is 45 to 50%. The left ventricle has mildly decreased function. The left ventricle demonstrates regional wall motion abnormalities. The left ventricular internal cavity size was normal in size. There is no left ventricular hypertrophy. Left ventricular diastolic parameters are indeterminate. Right Ventricle: The right ventricular size is normal. No increase in right ventricular wall thickness. Right ventricular systolic function is normal. There is normal pulmonary artery systolic pressure. The tricuspid regurgitant velocity is 2.56 m/s, and  with an assumed right atrial pressure of 3 mmHg, the estimated right ventricular systolic pressure is 29.2 mmHg. Left Atrium: Left atrial size was normal in size. Right Atrium: Right atrial size was normal in size. Pericardium: There is no evidence of pericardial effusion. Mitral  Valve: The mitral valve is normal in structure.  Trivial mitral valve regurgitation. MV peak gradient, 8.1 mmHg. The mean mitral valve gradient is 2.0 mmHg. Tricuspid Valve: The tricuspid valve is normal in structure. Tricuspid valve regurgitation is trivial. Aortic Valve: The aortic valve is tricuspid. Aortic valve regurgitation is not visualized. No aortic stenosis is present. Aortic valve mean gradient measures 5.0 mmHg. Aortic valve peak gradient measures 8.9 mmHg. Aortic valve area, by VTI measures 1.97 cm. Pulmonic Valve: The pulmonic valve was normal in structure. Pulmonic valve regurgitation is trivial. Aorta: The aortic root is normal in size and structure. Venous: The inferior vena cava is normal in size with greater than 50% respiratory variability, suggesting right atrial pressure of 3 mmHg. IAS/Shunts: The atrial septum is grossly normal.  LEFT VENTRICLE PLAX 2D LVIDd:         5.10 cm      Diastology LVIDs:         3.60 cm      LV e' medial:    7.66 cm/s LV PW:         0.80 cm      LV E/e' medial:  16.3 LV IVS:        0.60 cm      LV e' lateral:   9.44 cm/s LVOT diam:     1.90 cm      LV E/e' lateral: 13.2 LV SV:         68 LV SV Index:   43 LVOT Area:     2.84 cm  LV Volumes (MOD) LV vol d, MOD A2C: 106.0 ml LV vol d, MOD A4C: 90.5 ml LV vol s, MOD A2C: 44.4 ml LV vol s, MOD A4C: 37.1 ml LV SV MOD A2C:     61.6 ml LV SV MOD A4C:     90.5 ml LV SV MOD BP:      56.6 ml RIGHT VENTRICLE             IVC RV Basal diam:  3.70 cm     IVC diam: 1.60 cm RV S prime:     11.00 cm/s TAPSE (M-mode): 1.9 cm LEFT ATRIUM             Index        RIGHT ATRIUM           Index LA diam:        2.80 cm 1.77 cm/m   RA Area:     11.30 cm LA Vol (A2C):   39.5 ml 24.98 ml/m  RA Volume:   26.00 ml  16.44 ml/m LA Vol (A4C):   35.3 ml 22.32 ml/m LA Biplane Vol: 37.3 ml 23.59 ml/m  AORTIC VALVE AV Area (Vmax):    2.00 cm AV Area (Vmean):   1.93 cm AV Area (VTI):     1.97 cm AV Vmax:           149.00 cm/s AV Vmean:          107.000 cm/s AV VTI:            0.348 m AV  Peak Grad:      8.9 mmHg AV Mean Grad:      5.0 mmHg LVOT Vmax:         104.85 cm/s LVOT Vmean:        72.700 cm/s LVOT VTI:          0.242 m LVOT/AV VTI ratio: 0.69  AORTA Ao Root diam: 2.80  cm Ao Asc diam:  2.80 cm MITRAL VALVE                TRICUSPID VALVE MV Area (PHT): 3.65 cm     TR Peak grad:   26.2 mmHg MV Area VTI:   2.10 cm     TR Vmax:        256.00 cm/s MV Peak grad:  8.1 mmHg MV Mean grad:  2.0 mmHg     SHUNTS MV Vmax:       1.42 m/s     Systemic VTI:  0.24 m MV Vmean:      59.2 cm/s    Systemic Diam: 1.90 cm MV Decel Time: 208 msec MV E velocity: 125.00 cm/s MV A velocity: 54.30 cm/s MV E/A ratio:  2.30 Laurance Flatten MD Electronically signed by Laurance Flatten MD Signature Date/Time: 08/17/2022/4:03:11 PM    Final     Microbiology: Recent Results (from the past 240 hour(s))  Resp panel by RT-PCR (RSV, Flu A&B, Covid) Anterior Nasal Swab     Status: None   Collection Time: 09/05/22  1:24 PM   Specimen: Anterior Nasal Swab  Result Value Ref Range Status   SARS Coronavirus 2 by RT PCR NEGATIVE NEGATIVE Final    Comment: (NOTE) SARS-CoV-2 target nucleic acids are NOT DETECTED.  The SARS-CoV-2 RNA is generally detectable in upper respiratory specimens during the acute phase of infection. The lowest concentration of SARS-CoV-2 viral copies this assay can detect is 138 copies/mL. A negative result does not preclude SARS-Cov-2 infection and should not be used as the sole basis for treatment or other patient management decisions. A negative result may occur with  improper specimen collection/handling, submission of specimen other than nasopharyngeal swab, presence of viral mutation(s) within the areas targeted by this assay, and inadequate number of viral copies(<138 copies/mL). A negative result must be combined with clinical observations, patient history, and epidemiological information. The expected result is Negative.  Fact Sheet for Patients:   BloggerCourse.com  Fact Sheet for Healthcare Providers:  SeriousBroker.it  This test is no t yet approved or cleared by the [place] FDA and  has been authorized for detection and/or diagnosis of SARS-CoV-2 by FDA under an Emergency Use Authorization (EUA). This EUA will remain  in effect (meaning this test can be used) for the duration of the COVID-19 declaration under Section 564(b)(1) of the Act, 21 U.S.C.section 360bbb-3(b)(1), unless the authorization is terminated  or revoked sooner.       Influenza A by PCR NEGATIVE NEGATIVE Final   Influenza B by PCR NEGATIVE NEGATIVE Final    Comment: (NOTE) The Xpert Xpress SARS-CoV-2/FLU/RSV plus assay is intended as an aid in the diagnosis of influenza from Nasopharyngeal swab specimens and should not be used as a sole basis for treatment. Nasal washings and aspirates are unacceptable for Xpert [facility]press SARS-CoV-2/FLU/RSV testing.  Fact Sheet for Patients: BloggerCourse.com  Fact Sheet for Healthcare Providers: SeriousBroker.it  This test is not yet approved or cleared by the [place] FDA and has been authorized for detection and/or diagnosis of SARS-CoV-2 by FDA under an Emergency Use Authorization (EUA). This EUA will remain in effect (meaning this test can be used) for the duration of the COVID-19 declaration under Section 564(b)(1) of the Act, 21 U.S.C. section 360bbb-3(b)(1), unless the authorization is terminated or revoked.     Resp Syncytial Virus by PCR NEGATIVE NEGATIVE Final    Comment: (NOTE) Fact Sheet for Patients: BloggerCourse.com  Fact Sheet for Healthcare  Providers: SeriousBroker.it  This test is not yet approved or cleared by the [place] and has been authorized for detection and/or diagnosis of SARS-CoV-2 by FDA under an Emergency Use  Authorization (EUA). This EUA will remain in effect (meaning this test can be used) for the duration of the COVID-19 declaration under Section 564(b)(1) of the Act, 21 U.S.C. section 360bbb-3(b)(1), unless the authorization is terminated or revoked.  Performed at [facility], [address], [place], [place] [address]   MRSA Next Gen by PCR, Nasal     Status: None   Collection Time: 09/05/22 10:09 PM   Specimen: Nasal Mucosa; Nasal Swab  Result Value Ref Range Status   MRSA by PCR Next Gen NOT DETECTED NOT DETECTED Final    Comment: (NOTE) The GeneXpert MRSA Assay (FDA approved for NASAL specimens only), is one component of a comprehensive MRSA colonization surveillance program. It is not intended to diagnose MRSA infection nor to guide or monitor treatment for MRSA infections. Test performance is not FDA approved in patients less than 106 years old. Performed at [facility], [address], [place], [place] [address]   Culture, blood (Routine X 2) w Reflex to ID Panel     Status: None (Preliminary result)   Collection Time: 09/07/22 10:06 AM   Specimen: BLOOD LEFT HAND  Result Value Ref Range Status   Specimen Description BLOOD LEFT HAND  Final   Special Requests   Final    BOTTLES DRAWN AEROBIC AND ANAEROBIC Blood Culture adequate volume   Culture   Final    NO GROWTH 4 DAYS Performed at [facility], [address], [place], [place] [address]    Report Status PENDING  Incomplete  Culture, blood (Routine X 2) w Reflex to ID Panel     Status: None (Preliminary result)   Collection Time: 09/07/22 10:06 AM   Specimen: BLOOD RIGHT HAND  Result Value Ref Range Status   Specimen Description BLOOD RIGHT HAND  Final   Special Requests   Final    BOTTLES DRAWN AEROBIC AND ANAEROBIC Blood Culture adequate volume   Culture   Final    NO GROWTH 4 DAYS Performed at [facility], [address], [place], [place] [address]    Report Status  PENDING  Incomplete     Labs: Basic Metabolic Panel: Recent Labs  Lab 09/06/22 0149 09/07/22 0830 09/08/22 0400 09/08/22 0743 09/09/22 0340 09/10/22 0306  NA 135 135 135  --  136 137  K 3.6 4.2 3.7  --  3.3* 4.2  CL 99 101 101  --  104 105  CO2 19* 22 22  --  24 23  GLUCOSE 217* 209* 135*  --  101* 135*  BUN 24* 16 17  --  12 8  CREATININE 1.44* 1.16* 1.15*  --  1.28* 1.01*  CALCIUM 9.3 9.0 9.0  --  8.7* 8.7*  MG 2.3  --   --  2.2  --   --   PHOS 6.1*  --   --   --   --   --    Liver Function Tests: Recent Labs  Lab 09/05/22 1221 09/08/22 0400 09/09/22 0340  AST 33 25 24  ALT 25 22 22   ALKPHOS 105 82 73  BILITOT 0.7 0.8 0.3  PROT 8.8* 6.8 6.1*  ALBUMIN 4.9 3.5 3.3*   Recent Labs  Lab 09/05/22 1221  LIPASE <10*   No results for input(s): "  AMMONIA" in the last 168 hours. CBC: Recent Labs  Lab 09/06/22 0149 09/07/22 0451 09/08/22 0400 09/09/22 0340 09/10/22 0306  WBC 10.5 9.7 7.9 5.5 4.6  HGB 11.5* 12.1 11.6* 10.8* 10.6*  HCT 34.2* 36.5 35.6* 31.7* 32.5*  MCV 89.1 89.9 90.1 91.6 90.8  PLT 355 375 348 296 295   Cardiac Enzymes: No results for input(s): "CKTOTAL", "CKMB", "CKMBINDEX", "TROPONINI" in the last 168 hours. BNP: BNP (last 3 results) Recent Labs    11/03/21 1151 05/22/22 1529 06/28/22 0029  BNP 127.9* >4,500.0* 17.3    ProBNP (last 3 results) No results for input(s): "PROBNP" in the last 8760 hours.  CBG: Recent Labs  Lab 09/10/22 0618 09/10/22 1132 09/10/22 1617 09/10/22 2041 09/11/22 0209  GLUCAP 117* 241* 180* 186* 151*       Signed:  Zannie Cove MD.  [facility] 09/11/2022, 10:17 AM

## 2022-09-11 NOTE — Evaluation (Signed)
Occupational Therapy Evaluation and Discharge Summary Patient Details Name: Jennifer Hogan MRN: [id number] DOB: August 30, 1970 Today's Date: 09/11/2022   History of Present Illness Pt is a 52 yo female admitted for nausea/vomiting, weakness, constipation and elevated troponins most likely from demand ischemia.  Pt found to have UTI and PICC line removed as well.  PMH:  CHF, CABG x3, CAD, Type 1 DM, L IJ DVT, anemia, anxiety.   Clinical Impression   Pt admitted with the above diagnosis and overall has returned to mod I to I with all adls. Pt does not need assist with any adls or mobility at this time. Pt with HR in 120s with basic activity and would like to start some cardiac rehab as an outpatient which is very reasonable at this time. Pt does not have any acute or post acute OT needs at this time as she is independent with all adls. Discussed energy conservation and monitoring HR which is something pt does on a regular basis.  No further OT needs.       If plan is discharge home, recommend the following: Other (comment) (someone just to check in on pt when first d/c'd home)    Functional Status Assessment  Patient has not had a recent decline in their functional status  Equipment Recommendations  None recommended by OT    Recommendations for Other Services Other (comment) (Cardia rehab upon d/c)     Precautions / Restrictions Precautions Precautions: Other (comment) Precaution Comments: HR ran in 120s during all activity Required Braces or Orthoses: Splint/Cast Splint/Cast: Pt with R hand splint for [employer]. Restrictions Weight Bearing Restrictions: No      Mobility Bed Mobility Overal bed mobility: Independent             General bed mobility comments: no assist with bed flat    Transfers Overall transfer level: Independent Equipment used: None               General transfer comment: no assist needed      Balance Overall balance assessment: Modified  Independent                                         ADL either performed or assessed with clinical judgement   ADL Overall ADL's : Independent                                       General ADL Comments: No assist needed for any basic adls. Cues to rest due to HR in 120s with activity.     Vision Baseline Vision/History: 0 No visual deficits Ability to See in Adequate Light: 0 Adequate Patient Visual Report: No change from baseline Vision Assessment?: No apparent visual deficits     Perception Perception: Within Functional Limits       Praxis Praxis: WFL       Pertinent Vitals/Pain Pain Assessment Pain Assessment: No/denies pain     Extremity/Trunk Assessment Upper Extremity Assessment Upper Extremity Assessment: Overall WFL for tasks assessed   Lower Extremity Assessment Lower Extremity Assessment: Overall WFL for tasks assessed   Cervical / Trunk Assessment Cervical / Trunk Assessment: Normal   Communication Communication Communication: No apparent difficulties   Cognition Arousal: Alert Behavior During Therapy: WFL for tasks assessed/performed Overall Cognitive Status: Within Functional Limits  for tasks assessed                                       General Comments       Exercises     Shoulder Instructions      Home Living Family/patient expects to be discharged to:: Private residence Living Arrangements: Alone Available Help at Discharge: Friend(s);Available PRN/intermittently Type of Home: House Home Access: Stairs to enter [employer] of Steps: 3 Entrance Stairs-Rails: None Home Layout: One level     Bathroom Shower/Tub: IT trainer: Standard     Home Equipment: Shower seat          Prior Functioning/Environment Prior Level of Function : Independent/Modified Independent             Mobility Comments: independent ADLs Comments:  independent and driving        OT Problem List: Decreased activity tolerance      OT Treatment/Interventions:      OT Goals(Current goals can be found in the care plan section) Acute Rehab OT Goals Patient Stated Goal: to go home and get cardiac rehab OT Goal Formulation: All assessment and education complete, DC therapy  OT Frequency:      Co-evaluation              AM-PAC OT "6 Clicks" Daily Activity     Outcome Measure Help from another person eating meals?: None Help from another person taking care of personal grooming?: None Help from another person toileting, which includes using toliet, bedpan, or urinal?: None Help from another person bathing (including washing, rinsing, drying)?: None Help from another person to put on and taking off regular upper body clothing?: None Help from another person to put on and taking off regular lower body clothing?: None 6 Click Score: 24   End of Session Nurse Communication: Mobility status;Other (comment) (pt would like to get into the shower.  Asked nursing to get order for pt to bathe in shower with seat with mod I)  Activity Tolerance: Patient tolerated treatment well Patient left: in chair;with call bell/phone within reach  OT Visit Diagnosis: Unsteadiness on feet (R26.81)                Time: 6270-3500 OT Time Calculation (min): 21 min Charges:  OT General Charges $OT Visit: 1 Visit OT Evaluation $OT Eval Low Complexity: 1 Low  Hope Budds 09/11/2022, 9:06 AM

## 2022-09-12 ENCOUNTER — Telehealth: Payer: Self-pay

## 2022-09-12 ENCOUNTER — Other Ambulatory Visit: Payer: Self-pay

## 2022-09-12 LAB — CULTURE, BLOOD (ROUTINE X 2)
Culture: NO GROWTH
Culture: NO GROWTH
Special Requests: ADEQUATE
Special Requests: ADEQUATE

## 2022-09-12 NOTE — Progress Notes (Unsigned)
MD [facility] MD Outpatient Progress Note  09/13/2022 2:46 PM Jennifer Hogan  MRN:  [id number]  Assessment:  [name] presents for follow-up evaluation. Today, 09/13/22, patient reports overall psychiatric stability and renewed sense of hope and control over her life in the setting of receiving positive news in regards to physical health and disability approval. Denies signs/sx of depression or significant anxiety at this time. Patient identifies improving success in challenging cognitive distortions and remains interested in therapy. Resources discussed as below. No changes to plan of care at this time.  RTC in 2.5 months by video.  Identifying Information: Jennifer Hogan is a 52 y.o. female with a history of MDD, GAD, childhood trauma, T1DM with diabetic retinopathy and on insulin pump, significant coronary artery disease and CFH Stage 3 (s/p MI 2019, s/p 3 vessel CABG 10/2018, s/p multiple NSTEMIs), and hypothyroidism who is an established patient with [facility] participating in follow-up via video conferencing. Patient was recently discharged from [facility] partial hospitalization program (participated 08/25/21-09/20/21) for management of worsening anxiety and depression. During that time, she participated in therapy and started on Wellbutrin, noting significant benefit from [facility] program for mood and anxiety and demonstrating substantial insight on exam to contributors to mental health symptoms.   Plan:  # Major depressive disorder  Generalized anxiety disorder Past medication trials: Prozac 40 mg daily (ineffective), Effexor XR Status of problem: improving Interventions: -- Continue Cymbalta 30 mg daily -- Continue Wellbutrin XL 150 mg daily (significant irritability at 300 mg dose) -- Continue Atarax 25 mg TID PRN panic attacks (not currently requiring)  -- Reports she has been referred to [facility] for therapy by PCP; also provided with  psychologytoday.com resource   # Cannabis use Status of problem: improving Interventions: -- Continue to monitor use and provide psychoeducation   # History of alcohol use Past medication trials: None Status of problem: In remission Interventions: -- Patient reports social use but denies risky or escalating drinking behaviors; continue to monitor and support sobriety  Patient was given contact information for behavioral health clinic and was instructed to call 911 for emergencies.   Subjective:  Chief Complaint:  Chief Complaint  Patient presents with   Medication Management    Interval History:   Chart review: -- Medical admission 09/05/22-09/11/22 for elevated troponin requiring ICU admission and heparin infusion.   Patient reports there have been a lot of medical changes recently - was admitted to the hospital and at one point being considered by heart transplant. However had recent echo showing improvement in EF and is feeling a lot more hopeful. Was also approved for disability which has provided a lot of relief. Reports taking more steps to better her living situation - has hired Advertising copywriter.   Denies feeling persistently down, depressed, or anxious - states she is better at preventing thoughts from spiraling. Identifies ability to challenge cognitive distortions more effectively.  Denies SI/HI.   Reports she had stopped smoking cannabis for 4 months in consideration of transplant however now smokes 2-3 nights weekly for sleep. Denies any negative effects.   Reflects on the past several months and how quickly situations can change; feels invigorated by ability to have improved control over her thinking and feels she is getting a fresh start.  Continues to take medications as prescribed and feels they are working well. No questions/concerns at this time. Discussed resources for therapy.  Visit Diagnosis:    ICD-10-CM   1. GAD (generalized anxiety  disorder)  F41.1 buPROPion  (WELLBUTRIN XL) 150 MG 24 hr tablet    DULoxetine (CYMBALTA) 30 MG capsule    2. 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 Attended a mandated 2 week outpatient recovery program.   Past Medical History:  Past Medical History:  Diagnosis Date   Allergy    Anemia    Anxiety    Asthma    CAD S/P percutaneous coronary angioplasty 03/28/2017   Cataract    CHF (congestive heart failure) (HCC)    Coronary artery disease    a. s/p PTCA DES x3 in the LAD (ostial DES placed, mid DES placed, distal DES placed) and PTCA/DES x1 to intermediate branch 02/2017. b. ACS 02/08/18 with LCx stent placement.   Depression    Diabetes mellitus    type 1   DKA (diabetic ketoacidoses) 12/31/2017   DKA (diabetic ketoacidosis) (HCC) 01/13/2022   Dyspnea    with exertion and dust, no oxygen   Elevated platelet count    GERD (gastroesophageal reflux disease)    Heart murmur    never caused any problems   High cholesterol    Hypothyroidism    Ischemic cardiomyopathy    a. EF low-normal with basal inferior akinesis, basal septal hypokinesis by echo 01/2018.   Myocardial infarction (HCC) 2019   Nausea and vomiting 12/04/2010   Non-ST elevation (NSTEMI) myocardial infarction [facility]) 02/08/2018   NSTEMI (non-ST elevated myocardial infarction) (HCC)    Substance abuse (HCC)    marijuana    Past Surgical History:  Procedure Laterality Date   25 GAUGE PARS PLANA VITRECTOMY WITH 20 GAUGE MVR PORT Right 11/24/2021    Procedure: 25 GAUGE PARS PLANA VITRECTOMY APPROACH RIGHT EYE, FLUID FLUID EXCHANGE;  Surgeon: Carmela Rima, MD;  Location[facility] OR;  Service: Ophthalmology;  Laterality: Right;   AIR/FLUID EXCHANGE Right 12/27/2021   Procedure: AIR/FLUID EXCHANGE;  Surgeon: Carmela Rima, MD;  Location[facility] OR;  Service: Ophthalmology;  Laterality: Right;   APPENDECTOMY     BIOPSY  09/29/2021   Procedure: BIOPSY;  Surgeon: Benancio Deeds, MD;  Location: [facility];  Service: Gastroenterology;;   CATARACT EXTRACTION W/PHACO Right 11/24/2021   Procedure: CATARACT EXTRACTION AND INTRAOCULAR LENS PLACEMENT (IOC) RIGHT EYE;  Surgeon: Carmela Rima, MD;  Location[facility] OR;  Service: Ophthalmology;  Laterality: Right;   COLONOSCOPY     COLONOSCOPY WITH PROPOFOL N/A 09/29/2021   Procedure: COLONOSCOPY WITH PROPOFOL;  Surgeon: Benancio Deeds, MD;  Location: [facility]COPY;  Service: Gastroenterology;  Laterality: N/A;   CORONARY ARTERY BYPASS GRAFT N/A 11/04/2018   Procedure: CORONARY ARTERY BYPASS GRAFTING (CABG), ON PUMP, TIMES THREE, USING LEFT AND RIGHT INTERNAL MAMMARY ARTERIES;  Surgeon: Linden Dolin, MD;  Location: [facility] OR;  Service: Open Heart Surgery;  Laterality: N/A;   CORONARY STENT INTERVENTION N/A 03/20/2017   Procedure: DES x 3 LAD, DES OM; Surgeon: Kathleene Hazel, MD;  Location: [facility] INVASIVE CV LAB;  Service: Cardiovascular;  Laterality: N/A;   CORONARY/GRAFT ACUTE MI REVASCULARIZATION N/A  02/08/2018   Procedure: Coronary/Graft Acute MI Revascularization;  Surgeon: Lyn Records, MD;  Location: [facility] INVASIVE CV LAB;  Service: Cardiovascular;  Laterality: N/A;   ESOPHAGOGASTRODUODENOSCOPY (EGD) WITH PROPOFOL N/A 09/29/2021   Procedure: ESOPHAGOGASTRODUODENOSCOPY (EGD) WITH PROPOFOL;  Surgeon: Benancio Deeds, MD;  Location: [facility];  Service: Gastroenterology;  Laterality: N/A;   GAS INSERTION Right 04/28/2021   Procedure: INSERTION OF GAS ( C3F8);  Surgeon: Carmela Rima, MD;   Location: [facility] OR;  Service: Ophthalmology;  Laterality: Right;  Right eye   GAS/FLUID EXCHANGE Right 04/28/2021   Procedure: GAS/FLUID EXCHANGE;  Surgeon: Carmela Rima, MD;  Location: [facility] OR;  Service: Ophthalmology;  Laterality: Right;  Right eye   IR FLUORO GUIDE CV LINE RIGHT  05/29/2022   IR US GUIDE VASC ACCESS RIGHT  05/30/2022   LASER PHOTO ABLATION Right 11/24/2021   Procedure: ENDO LASER PAN PHOTOCOAGULATION;  Surgeon: Carmela Rima, MD;  Location: [facility] OR;  Service: Ophthalmology;  Laterality: Right;   LASER PHOTO ABLATION Right 12/27/2021   Procedure: ENDO LASER;  Surgeon: Carmela Rima, MD;  Location: [facility] OR;  Service: Ophthalmology;  Laterality: Right;   LEFT HEART CATH AND CORONARY ANGIOGRAPHY N/A 03/20/2017   Procedure: LEFT HEART CATH AND CORONARY ANGIOGRAPHY;  Surgeon: Kathleene Hazel, MD;  Location: [facility]VAS[facility] LAB;  Service: Cardiovascular;  Laterality: N/A;   LEFT HEART CATH AND CORONARY ANGIOGRAPHY N/A 02/08/2018   Procedure: LEFT HEART CATH AND CORONARY ANGIOGRAPHY;  Surgeon: Lyn Records, MD;  [facility]: [facility] LAB;  Service: Cardiovascular;  Laterality: N/A;   LEFT HEART CATH AND CORONARY ANGIOGRAPHY N/A 10/29/2018   Procedure: LEFT HEART CATH AND CORONARY ANGIOGRAPHY;  Surgeon: Kathleene Hazel, MD;  Location: [facility]AS[facility] LAB;  Service: Cardiovascular;  Laterality: N/A;   LEFT HEART CATH AND CORS/GRAFTS ANGIOGRAPHY N/A 10/04/2020   Procedure: LEFT HEART CATH AND CORS/GRAFTS ANGIOGRAPHY;  Surgeon: Tonny Bollman, MD;  Location: [facility] INVASIVE CV LAB;  Service: Cardiovascular;  Laterality: N/A;   LEFT HEART CATH AND CORS/GRAFTS ANGIOGRAPHY N/A 05/11/2021   Procedure: LEFT HEART CATH AND CORS/GRAFTS ANGIOGRAPHY;  Surgeon: Swaziland, Peter M, MD;  Location: [facility] INVASIVE CV LAB;  Service: Cardiovascular;  Laterality: N/A;   PARS PLANA VITRECTOMY Right 04/28/2021   Procedure: PARS PLANA VITRECTOMY WITH 25 GAUGE REMOVAL OF TRATIONAL MEMBRANE RIGHT EYE;  DRAINAGE OF SUBRETINAL FLUID RIGHT EYE;  Surgeon: Carmela Rima, MD;  Location: [facility] OR;  Service: Ophthalmology;  Laterality: Right;  Right eye   PARS PLANA VITRECTOMY 27 GAUGE Right 12/27/2021   Procedure: PARS PLANA VITRECTOMY 27 GAUGE;  Surgeon: Carmela Rima, MD;  Location: [facility] OR;  Service: Ophthalmology;  Laterality: Right;   PHOTOCOAGULATION WITH LASER Right 04/28/2021   Procedure: PHOTOCOAGULATION WITH LASER;  Surgeon: Carmela Rima, MD;  Location: [facility] OR;  Service: Ophthalmology;  Laterality: Right;  Right eye   POLYPECTOMY  09/29/2021   Procedure: POLYPECTOMY;  Surgeon: Benancio Deeds, MD;  Location: [facility];  Service: Gastroenterology;;   RIGHT/LEFT HEART CATH AND CORONARY/GRAFT ANGIOGRAPHY N/A 05/23/2022   Procedure: RIGHT/LEFT HEART CATH AND CORONARY/GRAFT ANGIOGRAPHY;  Surgeon: Kathleene Hazel, MD;  [facility]: [facility]VAS[facility] CV LAB;  Service: Cardiovascular;  Laterality: N/A;   TEE WITHOUT CARDIOVERSION N/A 11/04/2018   Procedure: TRANSESOPHAGEAL ECHOCARDIOGRAM (TEE);  Surgeon: Linden Dolin, MD;  Location: [facility] OR;  Service: Open Heart Surgery;  Laterality: N/A;    Family Psychiatric History: Father: etoh use disorder, anxiety Grandmother: depression, was treated with ECT later in her life  Family History:  Family History  Problem Relation Age of Onset   Cancer Mother        T cell lymphoma   Hypertension Mother    Hypercalcemia Mother    OCD Father    Anxiety disorder Father    Coronary artery disease Father    Congestive Heart Failure Father    Asthma Father    Hypercalcemia Father    Hypertension Father    Colon polyps Father    Depression Maternal Grandmother    Diabetes Paternal Grandmother    Stomach cancer Neg Hx    Esophageal cancer Neg Hx    Colon cancer Neg Hx    Rectal cancer Neg Hx     Social History:  Social History   Socioeconomic History   Marital status: Divorced    Spouse name: Not on file   Number of children: 0   Years  of education: Not on file   Highest education level: Doctorate  Occupational History   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 3 times weekly   Sexual activity: Not Currently    Birth control/protection: None  Other Topics Concern   Not on file  Social History Narrative   Not on file   Social Determinants of Health   Financial Resource Strain: High Risk (05/15/2022)   Overall Financial Resource Strain (CARDIA)    Difficulty of Paying Living Expenses: Very hard  Food Insecurity: No Food Insecurity (05/26/2022)   Hunger Vital Sign    Worried About Running Out of Food in the Last Year: Never true    Ran Out of Food in the Last Year: Never true  Recent Concern: Food Insecurity - Food Insecurity Present (05/15/2022)   Hunger Vital Sign    Worried About Running Out of Food in the Last Year: Often true    Ran Out of Food in the Last Year: Often true  Transportation Needs: No Transportation Needs (05/26/2022)   PRAPARE - Administrator, Civil Service (Medical): No    Lack of Transportation (Non-Medical): No  Recent Concern: Transportation Needs - Unmet Transportation Needs (05/15/2022)   PRAPARE - Transportation    Lack of Transportation (Medical): Yes    Lack of Transportation (Non-Medical): Yes  Physical Activity: Unknown (05/15/2022)   Exercise Vital Sign    Days of Exercise per Week: 0 days    Minutes of Exercise per Session: Not on file  Stress: Stress Concern Present (05/15/2022)   [employer] of Occupational Health - Occupational Stress Questionnaire     Feeling of Stress : Very much  Social Connections: Socially Isolated (05/15/2022)   Social Connection and Isolation Panel [NHANES]    Frequency of Communication with Friends and Family: More than three times a week    Frequency of Social Gatherings with Friends and Family: Once a week    Attends Religious Services: Never    Database administrator or Organizations: No    Attends Banker  Meetings: Not on file    Marital Status: Divorced    Allergies:  Allergies  Allergen Reactions   Crestor [Rosuvastatin Calcium] Other (See Comments)    Myalgias  Arthralgias    Lipitor [Atorvastatin] Other (See Comments)    Myalgias    Monosodium Glutamate Nausea And Vomiting and Other (See Comments)    MSG - migraine   Zetia [Ezetimibe] Other (See Comments)    Myalgias    Ciprofloxin Hcl [Ciprofloxacin] Nausea And Vomiting and Other (See Comments)    Severe migraine   Food Color Red [Red Dye #40 (Allura Red)] Diarrhea    Current Medications: Current Outpatient Medications  Medication Sig Dispense Refill   acetaminophen (TYLENOL) 500 MG tablet Take 1,500 mg by mouth as needed for moderate pain.     albuterol (VENTOLIN HFA) 108 (90 Base) MCG/ACT inhaler INHALE 2 PUFFS INTO THE LUNGS EVERY 6 (SIX) HOURS AS NEEDED FOR WHEEZING OR SHORTNESS OF BREATH. (Patient taking differently: Inhale 2 puffs into the lungs daily as needed for wheezing or shortness of breath.) 18 g 2   Alirocumab (PRALUENT) 150 MG/ML SOAJ Inject 1 pen  into the skin every 14 (fourteen) days. 2 mL 11   apixaban (ELIQUIS) 5 MG TABS tablet Take 1 tablet (5 mg total) by mouth 2 (two) times daily. 60 tablet 11   aspirin 81 MG EC tablet Take 1 tablet (81 mg total) by mouth daily. (Patient taking differently: Take 81 mg by mouth at bedtime.) 90 tablet 0   buPROPion (WELLBUTRIN XL) 150 MG 24 hr tablet Take 1 tablet (150 mg total) by mouth every morning. 30 tablet 2   cefdinir (OMNICEF) 300 MG capsule Take 1 capsule (300 mg total)  by mouth 2 (two) times daily for 3 days. 6 capsule 0   Continuous Glucose Sensor (DEXCOM G6 SENSOR) MISC Use as instructed to check glucose levels. Change every 10 days. 9 each 1   Continuous Glucose Transmitter (DEXCOM G6 TRANSMITTER) MISC Use as instructed to check glucose levels. Change every 90 days. 1 each 1   digoxin (LANOXIN) 0.125 MG tablet Take 1 tablet (0.125 mg total) by mouth daily. 30 tablet 6   diphenhydrAMINE (BENADRYL) 25 MG tablet Take 37.5 mg by mouth at bedtime.     DULoxetine (CYMBALTA) 30 MG capsule Take 1 capsule (30 mg total) by mouth daily. 30 capsule 2   fluticasone (FLONASE) 50 MCG/ACT nasal spray Insert 1 spray each nostril daily for 3 days then use 2-3 times a week as needed (Patient taking differently: Place 2 sprays into both nostrils daily. When able to remember) 16 g 1   Glucagon 3 MG/DOSE POWD Place 3 mg into the nose once as needed for up to 1 dose. 1 each 11   hydrOXYzine (ATARAX) 25 MG tablet Take 25 mg by mouth as needed for anxiety. (Patient not taking: Reported on 09/13/2022)     Insulin Disposable Pump (OMNIPOD 5 G6 PODS, GEN 5,) MISC Use every 3 (three) days. 30 each 3   insulin lispro (HUMALOG) 100 UNIT/ML injection Inject 0.35 mLs (35 Units total) into the skin daily via insulin pump 90 mL 3   levothyroxine (SYNTHROID) 100 MCG tablet Take 1 tablet (100 mcg total) by mouth daily before breakfast. 90 tablet 1   metoCLOPramide (REGLAN) 5 MG tablet Take 1 tablet (5 mg total) by mouth every 8 (eight) hours as needed for nausea or vomiting. (Patient taking differently: Take 2.5-5 mg by mouth as needed for nausea or vomiting.) 60  tablet 3   mometasone-formoterol (DULERA) 100-5 MCG/ACT AERO Inhale 2 puffs into the lungs 2 (two) times daily. 13 g 6   montelukast (SINGULAIR) 10 MG tablet Take 1 tablet (10 mg total) by mouth at bedtime. 90 tablet 2   Multiple Vitamin (MULTIVITAMIN WITH MINERALS) TABS tablet Take 1 tablet by mouth daily with lunch.     nitroGLYCERIN  (NITROSTAT) 0.4 MG SL tablet Place 1 tablet (0.4 mg total) under the tongue every 5 (five) minutes as needed for chest pain. 25 tablet 6   ondansetron (ZOFRAN) 4 MG tablet Take 1 tablet (4 mg total) by mouth daily as needed for nausea or vomiting. 30 tablet 1   OVER THE COUNTER MEDICATION Take 3 tablets by mouth in the morning. Super Lysine     pantoprazole (PROTONIX) 40 MG tablet Take 1 tablet (40 mg total) by mouth 2 (two) times daily. 60 tablet 2   No current facility-administered medications for this visit.    ROS: Denies any physical complaints  Objective:  Psychiatric Specialty Exam: Last menstrual period 06/06/2022.There is no height or weight on file to calculate BMI.  General Appearance: Casual and Well Groomed  Eye Contact:  Good  Speech:  Clear and Coherent and Normal Rate  Volume:  Normal  Mood:   "hopeful"  Affect:  Full Range and Euthymic and Engaged; Excited  Thought Process:  Goal Directed and Linear  Orientation:  Full (Time, Place, and Person)  Thought Content: Does not endorse AVH; no overt delusional content on interview  Suicidal Thoughts:   Denies SI  Homicidal Thoughts:  No  Memory:   Grossly intact  Judgment:  Good  Insight:  Good  Psychomotor Activity:  Normal  Concentration:  Concentration: Good  Recall:  NA  Fund of Knowledge: Good  Language: Good  Akathisia:  NA  Handed:  not assessed  AIMS (if indicated): not done  Assets:  Communication Skills Desire for Improvement Financial Resources/Insurance Housing Resilience Talents/Skills Vocational/Educational  ADL's:  Intact  Cognition: WNL  Sleep:  Good   PE: General: sits comfortably in view of camera; no acute distress  Pulm: no increased work of breathing on room air  MSK: all extremity movements appear intact  Neuro: no focal neurological deficits observed  Gait & Station: unable to assess by video    Metabolic Disorder Labs: Lab Results  Component Value Date   HGBA1C 7.2 (A)  08/23/2022   MPG 177.16 05/24/2022   MPG 183 03/24/2022   No results found for: "PROLACTIN" Lab Results  Component Value Date   CHOL 234 (H) 05/30/2022   TRIG 98 05/30/2022   HDL 64 05/30/2022   CHOLHDL 3.7 05/30/2022   VLDL 20 05/30/2022   LDLCALC 150 (H) 05/30/2022   LDLCALC 203 (H) 08/15/2021   Lab Results  Component Value Date   TSH 13.002 (H) 05/30/2022   TSH 2.505 05/15/2021    Therapeutic Level Labs: No results found for: "LITHIUM" No results found for: "VALPROATE" No results found for: "CBMZ"  Screenings: GAD-7    Flowsheet Row Office Visit from 02/15/2022 in [facility] Office Visit from 01/09/2022 in [facility] Office Visit from 11/28/2021 in [facility] Office Visit from 09/28/2021 in [facility] Counselor from 08/24/2021 in [facility]  Total GAD-7 Score 12 8 12 18 17       [phone]  Flowsheet Row Ventricular Assist Device from 06/08/2022 in [facility] Office Visit from 02/15/2022 in [facility] Office Visit from 01/09/2022 in [facility] Office Visit from 11/28/2021 in [facility] Counselor from 11/24/2021 in [place]  PHQ-2 Total Score 1 5 3 3 6   PHQ-9 Total Score -- 18 14 15 16       Flowsheet Row ED to Hosp-Admission (Discharged) from 09/05/2022 in [facility] 4E CV SURGICAL PROGRESSIVE CARE Cath Flush/Cent Line 60 from 08/10/2022 in [facility] Cath Flush/Cent Line 60 from 07/28/2022 in [facility]  C-SSRS RISK CATEGORY No Risk No Risk No Risk       Collaboration of Care: Collaboration of Care: Medication Management AEB active  medication changes and Psychiatrist AEB established with this provider  Patient/Guardian was advised Release of Information must be obtained prior to any record release in order to collaborate their care with an outside provider. Patient/Guardian was advised if they have not already done so to contact the registration department to sign all necessary forms in order for [name] to release information regarding their care.   Consent: Patient/Guardian gives verbal consent for treatment and assignment of benefits for services provided during this visit. Patient/Guardian expressed understanding and agreed to proceed.   Televisit via video: I connected with patient on 09/13/22 at  2:00 PM EDT by a video enabled telemedicine application and verified that I am speaking with the correct person using two identifiers.  Location: Patient: home in [place]  Provider: remote office in [place]   I discussed the limitations of evaluation and management by telemedicine and the availability of in person appointments. The patient expressed understanding and agreed to proceed.  I discussed the assessment and treatment plan with the patient. The patient was provided an opportunity to ask questions and all were answered. The patient agreed with the plan and demonstrated an understanding of the instructions.   The patient was advised to call back or seek an in-person evaluation if the symptoms worsen or if the condition fails to improve as anticipated.  I provided 30 minutes dedicated to the care of this patient via video on the date of this encounter to include chart review, face-to-face time with the patient, medication management/counseling, brief therapeutic support, and documentation.  Phares Zaccone A  09/13/2022, 2:46 PM

## 2022-09-12 NOTE — Transitions of Care (Post Inpatient/ED Visit) (Signed)
   09/12/2022  Name: Jennifer Hogan MRN: [id number] DOB: Mar 21, 1970  Today's TOC FU Call Status: Today's TOC FU Call Status:: Unsuccessful Call (1st Attempt) Unsuccessful Call (1st Attempt) Date: 09/12/22  Attempted to reach the patient regarding the most recent Inpatient/ED visit.  Follow Up Plan: Additional outreach attempts will be made to reach the patient to complete the Transitions of Care (Post Inpatient/ED visit) call.   Signature  Robyne Peers, RN

## 2022-09-13 ENCOUNTER — Telehealth (HOSPITAL_COMMUNITY): Payer: Medicare Other | Admitting: Psychiatry

## 2022-09-13 ENCOUNTER — Other Ambulatory Visit: Payer: Self-pay

## 2022-09-13 ENCOUNTER — Encounter (HOSPITAL_COMMUNITY): Payer: Self-pay | Admitting: Psychiatry

## 2022-09-13 ENCOUNTER — Encounter: Payer: Self-pay | Admitting: Internal Medicine

## 2022-09-13 ENCOUNTER — Ambulatory Visit: Payer: Medicare Other | Admitting: Internal Medicine

## 2022-09-13 ENCOUNTER — Telehealth: Payer: Self-pay

## 2022-09-13 VITALS — BP 118/70 | HR 97 | Ht 65.0 in | Wt 137.0 lb

## 2022-09-13 DIAGNOSIS — F331 Major depressive disorder, recurrent, moderate: Secondary | ICD-10-CM

## 2022-09-13 DIAGNOSIS — E119 Type 2 diabetes mellitus without complications: Secondary | ICD-10-CM

## 2022-09-13 DIAGNOSIS — E1059 Type 1 diabetes mellitus with other circulatory complications: Secondary | ICD-10-CM

## 2022-09-13 DIAGNOSIS — Z794 Long term (current) use of insulin: Secondary | ICD-10-CM

## 2022-09-13 DIAGNOSIS — E039 Hypothyroidism, unspecified: Secondary | ICD-10-CM

## 2022-09-13 DIAGNOSIS — F411 Generalized anxiety disorder: Secondary | ICD-10-CM | POA: Diagnosis not present

## 2022-09-13 MED ORDER — GLUCAGON 3 MG/DOSE NA POWD
3.0000 mg | Freq: Once | NASAL | 11 refills | Status: DC | PRN
  Filled 2022-09-13 – 2022-09-20 (×2): qty 1, 28d supply, fill #0

## 2022-09-13 MED ORDER — INSULIN LISPRO 100 UNIT/ML IJ SOLN
35.0000 [IU] | INTRAMUSCULAR | 3 refills | Status: DC
Start: 2022-09-13 — End: 2022-11-09
  Filled 2022-09-13: qty 30, 85d supply, fill #0

## 2022-09-13 MED ORDER — DULOXETINE HCL 30 MG PO CPEP
30.0000 mg | ORAL_CAPSULE | Freq: Every day | ORAL | 2 refills | Status: DC
Start: 2022-09-13 — End: 2022-11-09
  Filled 2022-09-13 – 2022-10-09 (×2): qty 30, 30d supply, fill #0

## 2022-09-13 MED ORDER — BUPROPION HCL ER (XL) 150 MG PO TB24
150.0000 mg | ORAL_TABLET | ORAL | 2 refills | Status: DC
Start: 2022-09-13 — End: 2022-11-09
  Filled 2022-09-13 – 2022-10-09 (×2): qty 30, 30d supply, fill #0

## 2022-09-13 NOTE — Transitions of Care (Post Inpatient/ED Visit) (Signed)
   09/13/2022  Name: Jennifer Hogan MRN: [id number] DOB: 06/20/1970  Today's TOC FU Call Status: Today's TOC FU Call Status:: Unsuccessful Call (2nd Attempt) Unsuccessful Call (1st Attempt) Date: 09/12/22 Unsuccessful Call (2nd Attempt) Date: 09/13/22  Attempted to reach the patient regarding the most recent Inpatient/ED visit.  Follow Up Plan: Additional outreach attempts will be made to reach the patient to complete the Transitions of Care (Post Inpatient/ED visit) call.   Signature Robyne Peers, RN

## 2022-09-13 NOTE — Progress Notes (Unsigned)
[name], female   DOB: 11-28-1970, 52 y.o.   MRN: [id number]  HPI: Jennifer Hogan is a 52 y.o.-year-old female, returning for follow-up for DM1, diagnosed in 1984, uncontrolled, with complications: CAD, s/p AMI, s/p PTCA and DES, ischemic cardiomyopathy, mildly CHF, DKA, gastroparesis.  She previously saw Dr. [name], last visit with him 11/2020 (video).  I last saw the patient 4 months ago. Now [employer].  Interim history: Patient continues to have a history of severe constipation, previously with stools once a week.  At times, she started developing abdominal fullness, pain, and vomiting along with diarrhea.  She had a gastric emptying study obtained in 11/2021, which was normal.  She was on Reglan, but not currently, as of now, she has Zofran at hand. In the last 2 years,  she had multiple hospitalization for the above symptoms, which trigger dehydration associated with DKA and demand ischemia (NSTEMI).  Latest hospitalization was from 09/09/2022. Her EF initially dropped to 20-25%, was on Dobutamine, considered for LVAD and heart transplant. However, her EF improved.  Currently on digoxin.  She is planning to start cardiac rehab. She started to take MiraLAX every other day and she started to have more frequent stools.  Reviewed HbA1c levels: Lab Results  Component Value Date   HGBA1C 7.2 (A) 08/23/2022   HGBA1C 7.8 (H) 05/24/2022   HGBA1C 8.0 (H) 03/24/2022   Insulin pump:  -since 2014 -Medtronic  CGM: -The Medtronic sensor was not affordable/accurate for her -now [employer] pharmacy benefits.  She called [name] [facility] supplier previously, but they advised her to get the Dexcom from the pharmacy.  Insulin: -Humalog  Supplies: -Medtronic - started Omnipod 5 04/2022  Pump settings -changes suggested at last visit are shown in bold and changes made by the patient since last visit are shown in green: - basal rates: 12 am: 0.550 >> 0.500 units/h 6 am: 0.625  >> 0.55 10 am: 0.600 >> 0.625 >> 0.60 6 PM: 0.625 >> 0.60 10 pm: 0.650 >> 0.625 >> 0.55 - ICR: 1:17 >> 1:15 >> 17.5 - target: 100-120 >> 110-150 - ISF: 75 >> 70 - Insulin on Board: 4h - bolus wizard: on - extended bolusing: not using - changes infusion site: q5-7 days TDD from basal insulin: 60% >> 62% >> 58% TDD from bolus insulin: 40% >> 38% >> 42% Total daily dose: 25-50 units  Meter: Contour Next  She is now checking blood sugars with the Dexcom CGM:  Prev.:  Previously:  Lowest sugar was 40 - at night >> 50s >> 40s >> 40; she has hypoglycemia awareness at 70.  She does not have a glucagon kit at home. No previous hypoglycemia admission.  Highest sugar was 600 >> 300s >> 300s >> upper 300s. + previous DKA admissions: 2020, 2021, 06/27/2021 (CGM defective).  - no CKD, last BUN/creatinine:  Lab Results  Component Value Date   BUN 8 09/10/2022   BUN 12 09/09/2022   CREATININE 1.01 (H) 09/10/2022   CREATININE 1.28 (H) 09/09/2022   - + HL; last set of lipids: Lab Results  Component Value Date   CHOL 234 (H) 05/30/2022   HDL 64 05/30/2022   LDLCALC 150 (H) 05/30/2022   LDLDIRECT 109 (H) 12/22/2019   TRIG 98 05/30/2022   CHOLHDL 3.7 05/30/2022  She tried Lipitor >> joint pain, mm aches >> not taking it >> sees the lipid clinic - on Praluent.  - last eye exam was in 2024. + DR reportedly-need  records. Sees retina specialist: Dr. Allena Katz. Had a vitrectomy + Laser Tx followed by IO inj's in [place]. Had cataracts sx in [place].   - no numbness and tingling in her feet.  Last foot exam 05/03/2022.  Hypothyroidism:  Pt is on levothyroxine 100 mcg daily, taken: - in am - fasting - at least 60 min from b'fast - no calcium - no iron - no multivitamins - + PPIs - Protonix added since last OV.  She takes this 1 hour after levothyroxine! - + on Biotin (B complex)- off for 2 weeks  Reviewed TSH levels: Lab Results  Component Value Date   TSH 13.002 (H) 05/30/2022   TSH 2.505  05/15/2021   TSH 0.737 03/25/2021   TSH 1.150 07/30/2020   TSH 1.06 01/09/2019   TSH 17.338 (H) 12/01/2018   TSH 1.830 07/23/2018   TSH 0.934 02/08/2018   TSH 2.287 12/31/2017   TSH 0.289 (L) 06/21/2017   TSH 1.302 03/16/2017   TSH 1.29 11/23/2015   TSH 0.95 10/08/2014   TSH 0.17 (L) 09/30/2012   TSH 0.33 (L) 07/09/2012   She has acid reflux.   ROS: + See HPI  Past Medical History:  Diagnosis Date   Allergy    Anemia    Anxiety    Asthma    CAD S/P percutaneous coronary angioplasty 03/28/2017   Cataract    CHF (congestive heart failure) (HCC)    Coronary artery disease    a. s/p PTCA DES x3 in the LAD (ostial DES placed, mid DES placed, distal DES placed) and PTCA/DES x1 to intermediate branch 02/2017. b. ACS 02/08/18 with LCx stent placement.   Depression    Diabetes mellitus    type 1   DKA (diabetic ketoacidoses) 12/31/2017   DKA (diabetic ketoacidosis) (HCC) 01/13/2022   Dyspnea    with exertion and dust, no oxygen   Elevated platelet count    GERD (gastroesophageal reflux disease)    Heart murmur    never caused any problems   High cholesterol    Hypothyroidism    Ischemic cardiomyopathy    a. EF low-normal with basal inferior akinesis, basal septal hypokinesis by echo 01/2018.   Myocardial infarction (HCC) 2019   Nausea and vomiting 12/04/2010   Non-ST elevation (NSTEMI) myocardial infarction [facility]) 02/08/2018   NSTEMI (non-ST elevated myocardial infarction) (HCC)    Substance abuse (HCC)    marijuana   Past Surgical History:  Procedure Laterality Date   25 GAUGE PARS PLANA VITRECTOMY WITH 20 GAUGE MVR PORT Right 11/24/2021   Procedure: 25 GAUGE PARS PLANA VITRECTOMY APPROACH RIGHT EYE, FLUID FLUID EXCHANGE;  Surgeon: Carmela Rima, MD;  Location: [facility] OR;  Service: Ophthalmology;  Laterality: Right;   AIR/FLUID EXCHANGE Right 12/27/2021   Procedure: AIR/FLUID EXCHANGE;  Surgeon: Carmela Rima, MD;  Location: [facility] OR;  Service: Ophthalmology;  Laterality:  Right;   APPENDECTOMY     BIOPSY  09/29/2021   Procedure: BIOPSY;  Surgeon: Benancio Deeds, MD;  Location: [facility];  Service: Gastroenterology;;   CATARACT EXTRACTION W/PHACO Right 11/24/2021   Procedure: CATARACT EXTRACTION AND INTRAOCULAR LENS PLACEMENT (IOC) RIGHT EYE;  Surgeon: Carmela Rima, MD;  Location: [facility] OR;  Service: Ophthalmology;  Laterality: Right;   COLONOSCOPY     COLONOSCOPY WITH PROPOFOL N/A 09/29/2021   Procedure: COLONOSCOPY WITH PROPOFOL;  Surgeon: Benancio Deeds, MD;  Location: [facility];  Service: Gastroenterology;  Laterality: N/A;   CORONARY ARTERY BYPASS GRAFT N/A 11/04/2018   Procedure: CORONARY ARTERY BYPASS  GRAFTING (CABG), ON PUMP, TIMES THREE, USING LEFT AND RIGHT INTERNAL MAMMARY ARTERIES;  Surgeon: Linden Dolin, MD;  Location: [facility] OR;  Service: Open Heart Surgery;  Laterality: N/A;   CORONARY STENT INTERVENTION N/A 03/20/2017   Procedure: DES x 3 LAD, DES OM; Surgeon: Kathleene Hazel, MD;  Location: [facility] INVASIVE CV LAB;  Service: Cardiovascular;  Laterality: N/A;   CORONARY/GRAFT ACUTE MI REVASCULARIZATION N/A 02/08/2018   Procedure: Coronary/Graft Acute MI Revascularization;  Surgeon: Lyn Records, MD;  Location: [facility] INVASIVE CV LAB;  Service: Cardiovascular;  Laterality: N/A;   ESOPHAGOGASTRODUODENOSCOPY (EGD) WITH PROPOFOL N/A 09/29/2021   Procedure: ESOPHAGOGASTRODUODENOSCOPY (EGD) WITH PROPOFOL;  Surgeon: Benancio Deeds, MD;  Location: [facility];  Service: Gastroenterology;  Laterality: N/A;   GAS INSERTION Right 04/28/2021   Procedure: INSERTION OF GAS ( C3F8);  Surgeon: Carmela Rima, MD;  Location: [facility] OR;  Service: Ophthalmology;  Laterality: Right;  Right eye   GAS/FLUID EXCHANGE Right 04/28/2021   Procedure: GAS/FLUID EXCHANGE;  Surgeon: Carmela Rima, MD;  Location: [facility] OR;  Service: Ophthalmology;  Laterality: Right;  Right eye   IR FLUORO GUIDE CV LINE RIGHT  05/29/2022   IR US GUIDE VASC ACCESS RIGHT  05/30/2022    LASER PHOTO ABLATION Right 11/24/2021   Procedure: ENDO LASER PAN PHOTOCOAGULATION;  Surgeon: Carmela Rima, MD;  Location[facility] OR;  Service: Ophthalmology;  Laterality: Right;   LASER PHOTO ABLATION Right 12/27/2021   Procedure: ENDO LASER;  Surgeon: Carmela Rima, MD;  Location: [facility] OR;  Service: Ophthalmology;  Laterality: Right;   LEFT HEART CATH AND CORONARY ANGIOGRAPHY N/A 03/20/2017   Procedure: LEFT HEART CATH AND CORONARY ANGIOGRAPHY;  Surgeon: Kathleene Hazel, MD;  Location: [facility];  Service: Cardiovascular;  Laterality: N/A;   LEFT HEART CATH AND CORONARY ANGIOGRAPHY N/A 02/08/2018   Procedure: LEFT HEART CATH AND CORONARY ANGIOGRAPHY;  Surgeon: Lyn Records, MD;  [facility]: [facility];  Service: Cardiovascular;  Laterality: N/A;   LEFT HEART CATH AND CORONARY ANGIOGRAPHY N/A 10/29/2018   Procedure: LEFT HEART CATH AND CORONARY ANGIOGRAPHY;  Surgeon: Kathleene Hazel, MD;  Location: [facility];  Service: Cardiovascular;  Laterality: N/A;   LEFT HEART CATH AND CORS/GRAFTS ANGIOGRAPHY N/A 10/04/2020   Procedure: LEFT HEART CATH AND CORS/GRAFTS ANGIOGRAPHY;  Surgeon: Tonny Bollman, MD;  Location: [facility] INVASIVE CV LAB;  Service: Cardiovascular;  Laterality: N/A;   LEFT HEART CATH AND CORS/GRAFTS ANGIOGRAPHY N/A 05/11/2021   Procedure: LEFT HEART CATH AND CORS/GRAFTS ANGIOGRAPHY;  Surgeon: Swaziland, Peter M, MD;  Location: [facility] INVASIVE CV LAB;  Service: Cardiovascular;  Laterality: N/A;   PARS PLANA VITRECTOMY Right 04/28/2021   Procedure: PARS PLANA VITRECTOMY WITH 25 GAUGE REMOVAL OF TRATIONAL MEMBRANE RIGHT EYE; DRAINAGE OF SUBRETINAL FLUID RIGHT EYE;  Surgeon: Carmela Rima, MD;  Location: [facility] OR;  Service: Ophthalmology;  Laterality: Right;  Right eye   PARS PLANA VITRECTOMY 27 GAUGE Right 12/27/2021   Procedure: PARS PLANA VITRECTOMY 27 GAUGE;  Surgeon: Carmela Rima, MD;  Location: [facility] OR;  Service: Ophthalmology;  Laterality: Right;    PHOTOCOAGULATION WITH LASER Right 04/28/2021   Procedure: PHOTOCOAGULATION WITH LASER;  Surgeon: Carmela Rima, MD;  Location: [facility] OR;  Service: Ophthalmology;  Laterality: Right;  Right eye   POLYPECTOMY  09/29/2021   Procedure: POLYPECTOMY;  Surgeon: Benancio Deeds, MD;  Location: [facility];  Service: Gastroenterology;;   RIGHT/LEFT HEART CATH AND CORONARY/GRAFT ANGIOGRAPHY N/A 05/23/2022   Procedure: RIGHT/LEFT HEART CATH AND CORONARY/GRAFT  ANGIOGRAPHY;  Surgeon: Kathleene Hazel, MD;  Location: [facility] INVASIVE CV LAB;  Service: Cardiovascular;  Laterality: N/A;   TEE WITHOUT CARDIOVERSION N/A 11/04/2018   Procedure: TRANSESOPHAGEAL ECHOCARDIOGRAM (TEE);  Surgeon: Linden Dolin, MD;  Location: [facility] OR;  Service: Open Heart Surgery;  Laterality: N/A;   Social History   Socioeconomic History   Marital status: Divorced    Spouse name: Not on file   Number of children: 0   Years of education: Not on file   Highest education level: Doctorate  Occupational History   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 3 times weekly; reports decreasing use   Sexual activity: Not Currently    Birth control/protection: None  Other Topics Concern   Not on file  Social History Narrative   Not on file   Social Determinants of Health   Financial Resource Strain: High Risk (05/15/2022)   Overall Financial Resource Strain (CARDIA)    Difficulty of Paying Living Expenses: Very hard  Food Insecurity: No Food Insecurity (05/26/2022)   Hunger Vital Sign    Worried About Running Out of  Food in the Last Year: Never true    Ran Out of Food in the Last Year: Never true  Recent Concern: Food Insecurity - Food Insecurity Present (05/15/2022)   Hunger Vital Sign    Worried About Running Out of Food in the Last Year: Often true    Ran Out of Food in the Last Year: Often true  Transportation Needs: No Transportation Needs (05/26/2022)   PRAPARE - Administrator, Civil Service (Medical): No    Lack of Transportation (Non-Medical): No  Recent Concern: Transportation Needs - Unmet Transportation Needs (05/15/2022)   PRAPARE - Transportation    Lack of Transportation (Medical): Yes    Lack of Transportation (Non-Medical): Yes  Physical Activity: Unknown (05/15/2022)   Exercise Vital Sign    Days of Exercise per Week: 0 days    Minutes of Exercise per Session: Not on file  Stress: Stress Concern Present (05/15/2022)   [employer] of Occupational Health - Occupational Stress Questionnaire    Feeling of Stress : Very much  Social Connections: Socially Isolated (05/15/2022)   Social Connection and Isolation Panel [NHANES]    Frequency of Communication with Friends and Family: More than three times a week    Frequency of Social Gatherings with Friends and Family: Once a week    Attends Religious Services: Never    Database administrator or Organizations: No    Attends Engineer, structural: Not on file    Marital Status: Divorced  Intimate Partner Violence: Not At Risk (05/26/2022)   Humiliation, Afraid, Rape, and Kick questionnaire    Fear of Current or Ex-Partner: No    Emotionally Abused: No    Physically Abused: No    Sexually Abused: No   Current Outpatient Medications on File Prior to Visit  Medication Sig Dispense Refill   acetaminophen (TYLENOL) 500  MG tablet Take 1,500 mg by mouth as needed for moderate pain.     albuterol (VENTOLIN HFA) 108 (90 Base) MCG/ACT inhaler INHALE 2 PUFFS INTO THE LUNGS EVERY 6 (SIX) HOURS AS NEEDED FOR WHEEZING OR SHORTNESS  OF BREATH. (Patient taking differently: Inhale 2 puffs into the lungs daily as needed for wheezing or shortness of breath.) 18 g 2   Alirocumab (PRALUENT) 150 MG/ML SOAJ Inject 1 pen  into the skin every 14 (fourteen) days. (Patient not taking: Reported on 09/06/2022) 2 mL 11   apixaban (ELIQUIS) 5 MG TABS tablet Take 1 tablet (5 mg total) by mouth 2 (two) times daily. 60 tablet 11   aspirin 81 MG EC tablet Take 1 tablet (81 mg total) by mouth daily. (Patient taking differently: Take 81 mg by mouth at bedtime.) 90 tablet 0   buPROPion (WELLBUTRIN XL) 150 MG 24 hr tablet Take 1 tablet (150 mg total) by mouth every morning. 30 tablet 1   cefdinir (OMNICEF) 300 MG capsule Take 1 capsule (300 mg total) by mouth 2 (two) times daily for 3 days. 6 capsule 0   Continuous Glucose Sensor (DEXCOM G6 SENSOR) MISC Use as instructed to check glucose levels. Change every 10 days. 9 each 1   Continuous Glucose Transmitter (DEXCOM G6 TRANSMITTER) MISC Use as instructed to check glucose levels. Change every 90 days. 1 each 1   digoxin (LANOXIN) 0.125 MG tablet Take 1 tablet (0.125 mg total) by mouth daily. 30 tablet 6   diphenhydrAMINE (BENADRYL) 25 MG tablet Take 37.5 mg by mouth at bedtime.     DULoxetine (CYMBALTA) 30 MG capsule Take 1 capsule (30 mg total) by mouth at bedtime. 30 capsule 1   fluticasone (FLONASE) 50 MCG/ACT nasal spray Insert 1 spray each nostril daily for 3 days then use 2-3 times a week as needed (Patient taking differently: Place 2 sprays into both nostrils daily. When able to remember) 16 g 1   Glucagon 3 MG/DOSE POWD Place 3 mg into the nose once as needed for up to 1 dose. 1 each 11   hydrOXYzine (ATARAX) 25 MG tablet Take 25 mg by mouth as needed for anxiety.     Insulin Disposable Pump (OMNIPOD 5 G6 PODS, GEN 5,) MISC Use every 3 (three) days. 30 each 3   insulin lispro (HUMALOG) 100 UNIT/ML injection Inject 0.35 mLs (35 Units total) into the skin daily via pump. (Patient taking differently:  Inject 35 Units into the skin See admin instructions. 35 units daily via insulin pump) 30 mL 1   levothyroxine (SYNTHROID) 100 MCG tablet Take 1 tablet (100 mcg total) by mouth daily before breakfast. 90 tablet 1   metoCLOPramide (REGLAN) 5 MG tablet Take 1 tablet (5 mg total) by mouth every 8 (eight) hours as needed for nausea or vomiting. (Patient taking differently: Take 2.5-5 mg by mouth as needed for nausea or vomiting.) 60 tablet 3   mometasone-formoterol (DULERA) 100-5 MCG/ACT AERO Inhale 2 puffs into the lungs 2 (two) times daily. 13 g 6   montelukast (SINGULAIR) 10 MG tablet Take 1 tablet (10 mg total) by mouth at bedtime. 90 tablet 2   Multiple Vitamin (MULTIVITAMIN WITH MINERALS) TABS tablet Take 1 tablet by mouth daily with lunch.     nitroGLYCERIN (NITROSTAT) 0.4 MG SL tablet Place 1 tablet (0.4 mg total) under the tongue every 5 (five) minutes as needed for chest pain. 25 tablet 6   ondansetron (ZOFRAN) 4 MG tablet Take 1 tablet (4 mg  total) by mouth daily as needed for nausea or vomiting. 30 tablet 1   OVER THE COUNTER MEDICATION Take 3 tablets by mouth in the morning. Super Lysine     pantoprazole (PROTONIX) 40 MG tablet Take 1 tablet (40 mg total) by mouth 2 (two) times daily. 60 tablet 2   No current facility-administered medications on file prior to visit.   Allergies  Allergen Reactions   Crestor [Rosuvastatin Calcium] Other (See Comments)    Myalgias  Arthralgias    Lipitor [Atorvastatin] Other (See Comments)    Myalgias    Monosodium Glutamate Nausea And Vomiting and Other (See Comments)    MSG - migraine   Zetia [Ezetimibe] Other (See Comments)    Myalgias    Ciprofloxin Hcl [Ciprofloxacin] Nausea And Vomiting and Other (See Comments)    Severe migraine   Food Color Red [Red Dye #40 (Allura Red)] Diarrhea   Family History  Problem Relation Age of Onset   Cancer Mother        T cell lymphoma   Hypertension Mother    Hypercalcemia Mother    OCD Father     Anxiety disorder Father    Coronary artery disease Father    Congestive Heart Failure Father    Asthma Father    Hypercalcemia Father    Hypertension Father    Colon polyps Father    Depression Maternal Grandmother    Diabetes Paternal Grandmother    Stomach cancer Neg Hx    Esophageal cancer Neg Hx    Colon cancer Neg Hx    Rectal cancer Neg Hx    PE: LMP 06/06/2022  Wt Readings from Last 3 Encounters:  09/11/22 138 lb 0.1 oz (62.6 kg)  08/23/22 141 lb 12.8 oz (64.3 kg)  06/27/22 128 lb (58.1 kg)   Constitutional: normal weight, anxious appearing Eyes:  no exophthalmos ENT:  no masses palpated in neck, no cervical lymphadenopathy Cardiovascular: RRR, No MRG Respiratory: CTA B Musculoskeletal: no deformities Skin:  + vitiligo on arms and chest Neurological: + Mild tremor with outstretched hands  ASSESSMENT: 1. DM1, uncontrolled, with complications - DKA - CAD, s/p AMI, s/p PTCA - DES; s/p NSTEMI 0418/2023 - iCMP - CHF - gastroparesis - ? (Recent gastric emptying study normal on 12/02/2021)  2. Hypothyroidism  PLAN:  1. Patient with longstanding, uncontrolled, type 1 diabetes, with suboptimal control, but improved HbA1c at last check, 1 month ago.  At that time, HbA1c was 7.2%, improved from 7.8% 3 months prior.  When I last saw her in 04/2022, she just had an HbA1c of 8.0%. -She continues to be on the Medtronic insulin pump and has the Dexcom G7 CGM.  At last visit I recommended to switch the pump to either OmniPod or t:slim X2.  She was able to start the OmniPod 5 pump since last visit.  She feels that her sugars improved after starting on this. -Previously identified problems in her diabetes management -per review of last visits note: She is not bolusing 15 minutes before meals, but many times, she boluses when the sugars are already up after a meal.  As a consequence, her sugars are dropping too low afterwards.  She does have gastroparesis and we discussed about trying  to bolus 15 minutes before each meal, however, to move the boluses closer to the meal if needed depending on the blood sugars.  However, entering carbs and taking the bolus when the sugars already high after a meal is not conducive to good  diabetes control Sugars are dropping overnight but this is most likely due to bolusing for dinner too late as mentioned above She is not entering all of the carbs into the pump.  We discussed about trying to enter blood sugars, enter carbs, and bolus before every meal. She changes her insulin in the reservoir and her infusion site less than every 7 days.  We discussed about inflammation at the site and inconsistent absorption if she does that.  She was trying to ration her supplies from Medtronic. She is not in the auto mode.  She states in the manual mode and she did not notice that.  I advised her to start the automatic mode She is stopping the pump for several hours a day.  We discussed that this is dangerous.  She mentions that many times when she sees a low blood sugar, especially during the night, she stops the pump and that she forgets to turn it on.  We discussed that she should not stop the pump, but correct the low blood sugar with scars and leave the pump going. -At last visit, reviewing the CGM trends, sugars are mostly fluctuating within the target range but they were increasing after meals and decreasing after dinner.  We adjusted her basal rates: Decreasing her basal rate from 10 PM to 6 AM and increasing basal rate from 10 AM to 10 PM.  We also strengthened her insulin to carb ratio. CGM interpretation: -At today's visit, we reviewed her CGM downloads: It appears that 60% of values are in target range (goal >70%), while 40% are higher than 180 (goal <25%), and 0% are lower than 70 (goal <4%).  The calculated average blood sugar is 174.  The projected HbA1c for the next 3 months (GMI) is 7.5%. -Reviewing the CGM trends, sugars are fluctuating in the upper  range of the target interval but increasing above the target interval approximately 50% of the time after the meals.  Sugars are also slightly higher during the night. -At today's visit, reviewing her pump download, she still does not appear to enter carbs appropriately in the pump before meals and we discussed about continuing to work on this.  She sometimes enters carbs at the beginning of the meal later, which is acceptable, especially due to her gastric condition, however, we discussed that for Humalog, she needs to start the boluses 15 minutes before each meal.  For now, I also advised her to strengthen her insulin to carb ratios, but otherwise, we can continue the rest of the settings. -I suggested to: Patient Instructions  Please use the following pump settings: - basal rates: 12 am: 0.500 units/h 6 am: 0.55 10 am: 0.60 6 pm: 0.60 10 pm: 0.55 - ICR: 1:17.5 >> 1:15 - target: 100-150 - ISF: 70 - Insulin on Board: 4h  Please do the following approximately 15 minutes before every meal: - Enter carbs (C) - Enter sugars (S) - Start insulin bolus (I)  Please continue Levothyroxine 100 mcg daily.  Take the thyroid hormone every day, with water, at least 30 minutes before breakfast, separated by at least 4 hours from: - acid reflux medications - calcium - iron - multivitamins  Stop B complex 1 week before next visit.  Move Protonix at least 4h from Levothyroxine.  Please return in 3-4 months.  - we checked her HbA1c: 7.0% (lower and lowest that she had in a long time!) - advised to check sugars at different times of the day - 4x a  day, rotating check times - advised for yearly eye exams >> she is UTD - return to clinic in 3-4 months  2.  Hypothyroidism - latest thyroid labs reviewed with pt. >> elevated TSH: Lab Results  Component Value Date   TSH 13.002 (H) 05/30/2022  - she continues on LT4 100 mcg daily - pt feels good on this dose. - we discussed about taking the  thyroid hormone every day, with water, >30 minutes before breakfast, separated by >4 hours from acid reflux medications, calcium, iron, multivitamins. Pt. is not taking it correctly: She added Protonix taken only 1 hour after levothyroxine.  I advised her to move this at least 4 hours later. - will check thyroid tests  in 1.5 months: TSH and fT4 - If labs are abnormal, she will need to return for repeat TFTs in 1.5 months   Orders Placed This Encounter  Procedures   TSH   T4, free   Carlus Pavlov, MD PhD [facility] Endocrinology

## 2022-09-13 NOTE — Patient Instructions (Signed)
Thank you for attending your appointment today.  -- We did not make any medication changes today. Please continue medications as prescribed. -- The therapy resource we discussed today is called [email or web address] where you can filter therapists by insurance, location, and specialty.  Please do not make any changes to medications without first discussing with your provider. If you are experiencing a psychiatric emergency, please call 911 or present to your nearest emergency department. Additional crisis, medication management, and therapy resources are included below.  [facility]  [address], [place], [place] [address] [phone] WALK-IN URGENT CARE 24/7 FOR ANYONE [address], [place], [place]  [phone] Fax: [phone] [email or web address] *Interpreters available *Accepts all insurance and uninsured for Urgent Care needs *Accepts Medicaid and uninsured for outpatient treatment (below)      ONLY FOR [facility]  Below:    Outpatient New Patient Assessment/Therapy Walk-ins:        Monday -Thursday 8am until slots are full.        Every Friday 1pm-4pm  (first come, first served)                   New Patient Psychiatry/Medication Management        Monday-Friday 8am-11am (first come, first served)               For all walk-ins we ask that you arrive by 7:15am, because patients will be seen in the order of arrival.

## 2022-09-13 NOTE — Patient Instructions (Addendum)
Please use the following pump settings: - basal rates: 12 am: 0.500 units/h 6 am: 0.55 10 am: 0.60 6 pm: 0.60 10 pm: 0.55 - ICR: 1:17.5 >> 1:15 - target: 100-150 - ISF: 70 - Insulin on Board: 4h  Please do the following approximately 15 minutes before every meal: - Enter carbs (C) - Enter sugars (S) - Start insulin bolus (I)  Please continue Levothyroxine 100 mcg daily.  Take the thyroid hormone every day, with water, at least 30 minutes before breakfast, separated by at least 4 hours from: - acid reflux medications - calcium - iron - multivitamins  Stop B complex 1 week before next visit.  Move Protonix at least 4h from Levothyroxine.  Please return in 3-4 months.

## 2022-09-14 ENCOUNTER — Telehealth: Payer: Self-pay

## 2022-09-14 ENCOUNTER — Encounter (HOSPITAL_COMMUNITY): Payer: Medicare Other

## 2022-09-14 NOTE — Transitions of Care (Post Inpatient/ED Visit) (Signed)
   09/14/2022  Name: Jennifer Hogan MRN: [id number] DOB: April 20, 1970  Today's TOC FU Call Status: Today's TOC FU Call Status:: Unsuccessful Call (3rd Attempt) Unsuccessful Call (1st Attempt) Date: 09/12/22 Unsuccessful Call (2nd Attempt) Date: 09/13/22 Unsuccessful Call (3rd Attempt) Date: 09/14/22  Attempted to reach the patient regarding the most recent Inpatient/ED visit.  Follow Up Plan: No further outreach attempts will be made at this time. We have been unable to contact the patient.  The patient has an appointment with Georgian Co, PA at [facility] on 09/20/2022.   Signature Robyne Peers, RN

## 2022-09-19 ENCOUNTER — Other Ambulatory Visit: Payer: Self-pay

## 2022-09-20 ENCOUNTER — Encounter: Payer: Self-pay | Admitting: Internal Medicine

## 2022-09-20 ENCOUNTER — Encounter: Payer: Self-pay | Admitting: Physician Assistant

## 2022-09-20 ENCOUNTER — Encounter: Payer: Self-pay | Admitting: Gastroenterology

## 2022-09-20 ENCOUNTER — Encounter (HOSPITAL_COMMUNITY): Payer: Self-pay | Admitting: Student in an Organized Health Care Education/Training Program

## 2022-09-20 ENCOUNTER — Ambulatory Visit: Payer: Medicare Other | Attending: Physician Assistant | Admitting: Physician Assistant

## 2022-09-20 ENCOUNTER — Ambulatory Visit (INDEPENDENT_AMBULATORY_CARE_PROVIDER_SITE_OTHER): Payer: Medicare Other | Admitting: Gastroenterology

## 2022-09-20 ENCOUNTER — Other Ambulatory Visit: Payer: Self-pay

## 2022-09-20 VITALS — BP 113/73 | HR 78 | Wt 138.4 lb

## 2022-09-20 VITALS — BP 92/56 | HR 88 | Ht 65.0 in | Wt 137.8 lb

## 2022-09-20 DIAGNOSIS — Z7901 Long term (current) use of anticoagulants: Secondary | ICD-10-CM | POA: Insufficient documentation

## 2022-09-20 DIAGNOSIS — Z9641 Presence of insulin pump (external) (internal): Secondary | ICD-10-CM | POA: Insufficient documentation

## 2022-09-20 DIAGNOSIS — Z23 Encounter for immunization: Secondary | ICD-10-CM

## 2022-09-20 DIAGNOSIS — I5022 Chronic systolic (congestive) heart failure: Secondary | ICD-10-CM | POA: Diagnosis not present

## 2022-09-20 DIAGNOSIS — Z79899 Other long term (current) drug therapy: Secondary | ICD-10-CM | POA: Diagnosis not present

## 2022-09-20 DIAGNOSIS — I428 Other cardiomyopathies: Secondary | ICD-10-CM | POA: Diagnosis not present

## 2022-09-20 DIAGNOSIS — E1059 Type 1 diabetes mellitus with other circulatory complications: Secondary | ICD-10-CM

## 2022-09-20 DIAGNOSIS — Z09 Encounter for follow-up examination after completed treatment for conditions other than malignant neoplasm: Secondary | ICD-10-CM

## 2022-09-20 DIAGNOSIS — R112 Nausea with vomiting, unspecified: Secondary | ICD-10-CM

## 2022-09-20 DIAGNOSIS — Z87440 Personal history of urinary (tract) infections: Secondary | ICD-10-CM | POA: Insufficient documentation

## 2022-09-20 DIAGNOSIS — K5909 Other constipation: Secondary | ICD-10-CM

## 2022-09-20 DIAGNOSIS — Z7982 Long term (current) use of aspirin: Secondary | ICD-10-CM | POA: Diagnosis not present

## 2022-09-20 DIAGNOSIS — Z79890 Hormone replacement therapy (postmenopausal): Secondary | ICD-10-CM | POA: Diagnosis not present

## 2022-09-20 DIAGNOSIS — N390 Urinary tract infection, site not specified: Secondary | ICD-10-CM | POA: Diagnosis not present

## 2022-09-20 DIAGNOSIS — Z951 Presence of aortocoronary bypass graft: Secondary | ICD-10-CM | POA: Insufficient documentation

## 2022-09-20 LAB — POCT URINALYSIS DIP (CLINITEK)
Bilirubin, UA: NEGATIVE
Glucose, UA: NEGATIVE mg/dL
Ketones, POC UA: NEGATIVE mg/dL
Leukocytes, UA: NEGATIVE
Nitrite, UA: NEGATIVE
POC PROTEIN,UA: NEGATIVE
Spec Grav, UA: 1.025 (ref 1.010–1.025)
Urobilinogen, UA: 0.2 E.U./dL
pH, UA: 6 (ref 5.0–8.0)

## 2022-09-20 NOTE — Patient Instructions (Signed)
Please take Miralax one capful in 8 oz of water once or twice daily.  _______________________________________________________  If your blood pressure at your visit was 140/90 or greater, please contact your primary care physician to follow up on this.  _______________________________________________________  If you are age 54 or older, your body mass index should be between 23-30. Your Body mass index is 22.93 kg/m. If this is out of the aforementioned range listed, please consider follow up with your Primary Care Provider.  If you are age 14 or younger, your body mass index should be between 19-25. Your Body mass index is 22.93 kg/m. If this is out of the aformentioned range listed, please consider follow up with your Primary Care Provider.   ________________________________________________________  The [facility] providers would like to encourage you to use [facility] to communicate with providers for non-urgent requests or questions.  Due to long hold times on the telephone, sending your provider a message by [facility] may be a faster and more efficient way to get a response.  Please allow 48 business hours for a response.  Please remember that this is for non-urgent requests.  _______________________________________________________  I appreciate the opportunity to care for you. Doug Sou PA-C

## 2022-09-20 NOTE — Progress Notes (Unsigned)
09/20/2022 Jennifer Hogan [id number] 29-Aug-1970   HISTORY OF PRESENT ILLNESS: ***         Past Medical History:  Diagnosis Date   Allergy    Anemia    Anxiety    Asthma    CAD S/P percutaneous coronary angioplasty 03/28/2017   Cataract    CHF (congestive heart failure) (HCC)    Coronary artery disease    a. s/p PTCA DES x3 in the LAD (ostial DES placed, mid DES placed, distal DES placed) and PTCA/DES x1 to intermediate branch 02/2017. b. ACS 02/08/18 with LCx stent placement.   Depression    Diabetes mellitus    type 1   DKA (diabetic ketoacidoses) 12/31/2017   DKA (diabetic ketoacidosis) (HCC) 01/13/2022   Dyspnea    with exertion and dust, no oxygen   Elevated platelet count    GERD (gastroesophageal reflux disease)    Heart murmur    never caused any problems   High cholesterol    Hypothyroidism    Ischemic cardiomyopathy    a. EF low-normal with basal inferior akinesis, basal septal hypokinesis by echo 01/2018.   Myocardial infarction (HCC) 2019   Nausea and vomiting 12/04/2010   Non-ST elevation (NSTEMI) myocardial infarction [facility]) 02/08/2018   NSTEMI (non-ST elevated myocardial infarction) (HCC)    Substance abuse (HCC)    marijuana   Past Surgical History:  Procedure Laterality Date   25 GAUGE PARS PLANA VITRECTOMY WITH 20 GAUGE MVR PORT Right 11/24/2021   Procedure: 25 GAUGE PARS PLANA VITRECTOMY APPROACH RIGHT EYE, FLUID FLUID EXCHANGE;  Surgeon: Carmela Rima, MD;  Location: [facility] OR;  Service: Ophthalmology;  Laterality: Right;   AIR/FLUID EXCHANGE Right 12/27/2021   Procedure: AIR/FLUID EXCHANGE;  Surgeon: Carmela Rima, MD;  Location: [facility] OR;  Service: Ophthalmology;  Laterality: Right;   APPENDECTOMY     BIOPSY  09/29/2021   Procedure: BIOPSY;  Surgeon: Benancio Deeds, MD;  Location: [facility];  Service: Gastroenterology;;   CATARACT EXTRACTION W/PHACO Right 11/24/2021   Procedure: CATARACT EXTRACTION AND INTRAOCULAR LENS PLACEMENT  (IOC) RIGHT EYE;  Surgeon: Carmela Rima, MD;  Location: [facility] OR;  Service: Ophthalmology;  Laterality: Right;   COLONOSCOPY     COLONOSCOPY WITH PROPOFOL N/A 09/29/2021   Procedure: COLONOSCOPY WITH PROPOFOL;  Surgeon: Benancio Deeds, MD;  Location: [facility];  Service: Gastroenterology;  Laterality: N/A;   CORONARY ARTERY BYPASS GRAFT N/A 11/04/2018   Procedure: CORONARY ARTERY BYPASS GRAFTING (CABG), ON PUMP, TIMES THREE, USING LEFT AND RIGHT INTERNAL MAMMARY ARTERIES;  Surgeon: Linden Dolin, MD;  Location: [facility] OR;  Service: Open Heart Surgery;  Laterality: N/A;   CORONARY STENT INTERVENTION N/A 03/20/2017   Procedure: DES x 3 LAD, DES OM; Surgeon: Kathleene Hazel, MD;  Location: [facility] INVASIVE CV LAB;  Service: Cardiovascular;  Laterality: N/A;   CORONARY/GRAFT ACUTE MI REVASCULARIZATION N/A 02/08/2018   Procedure: Coronary/Graft Acute MI Revascularization;  Surgeon: Lyn Records, MD;  Location: [facility] INVASIVE CV LAB;  Service: Cardiovascular;  Laterality: N/A;   ESOPHAGOGASTRODUODENOSCOPY (EGD) WITH PROPOFOL N/A 09/29/2021   Procedure: ESOPHAGOGASTRODUODENOSCOPY (EGD) WITH PROPOFOL;  Surgeon: Benancio Deeds, MD;  Location: [facility];  Service: Gastroenterology;  Laterality: N/A;   GAS INSERTION Right 04/28/2021   Procedure: INSERTION OF GAS ( C3F8);  Surgeon: Carmela Rima, MD;  Location: [facility] OR;  Service: Ophthalmology;  Laterality: Right;  Right eye   GAS/FLUID EXCHANGE Right 04/28/2021   Procedure: GAS/FLUID EXCHANGE;  Surgeon: Carmela Rima, MD;  Location: [facility] OR;  Service: Ophthalmology;  Laterality: Right;  Right eye   IR FLUORO GUIDE CV LINE RIGHT  05/29/2022   IR US GUIDE VASC ACCESS RIGHT  05/30/2022   LASER PHOTO ABLATION Right 11/24/2021   Procedure: ENDO LASER PAN PHOTOCOAGULATION;  Surgeon: Carmela Rima, MD;  Location: [facility] OR;  Service: Ophthalmology;  Laterality: Right;   LASER PHOTO ABLATION Right 12/27/2021   Procedure: ENDO LASER;  Surgeon: Carmela Rima, MD;  Location: [facility] OR;  Service: Ophthalmology;  Laterality: Right;   LEFT HEART CATH AND CORONARY ANGIOGRAPHY N/A 03/20/2017   Procedure: LEFT HEART CATH AND CORONARY ANGIOGRAPHY;  Surgeon: Kathleene Hazel, MD;  Location: [facility];  Service: Cardiovascular;  Laterality: N/A;   LEFT HEART CATH AND CORONARY ANGIOGRAPHY N/A 02/08/2018   Procedure: LEFT HEART CATH AND CORONARY ANGIOGRAPHY;  Surgeon: Lyn Records, MD;  Location: [facility];  Service: Cardiovascular;  Laterality: N/A;   LEFT HEART CATH AND CORONARY ANGIOGRAPHY N/A 10/29/2018   Procedure: LEFT HEART CATH AND CORONARY ANGIOGRAPHY;  Surgeon: Kathleene Hazel, MD;  Location: [facility];  Service: Cardiovascular;  Laterality: N/A;   LEFT HEART CATH AND CORS/GRAFTS ANGIOGRAPHY N/A 10/04/2020   Procedure: LEFT HEART CATH AND CORS/GRAFTS ANGIOGRAPHY;  Surgeon: Tonny Bollman, MD;  Location: [facility] INVASIVE CV LAB;  Service: Cardiovascular;  Laterality: N/A;   LEFT HEART CATH AND CORS/GRAFTS ANGIOGRAPHY N/A 05/11/2021   Procedure: LEFT HEART CATH AND CORS/GRAFTS ANGIOGRAPHY;  Surgeon: Swazil[place], Peter M, MD;  Location: [facility] INVASIVE CV LAB;  Service: Cardiovascular;  Laterality: N/A;   PARS PLANA VITRECTOMY Right 04/28/2021   Procedure: PARS PLANA VITRECTOMY WITH 25 GAUGE REMOVAL OF TRATIONAL MEMBRANE RIGHT EYE; DRAINAGE OF SUBRETINAL FLUID RIGHT EYE;  Surgeon: Carmela Rima, MD;  Location: [facility] OR;  Service: Ophthalmology;  Laterality: Right;  Right eye   PARS PLANA VITRECTOMY 27 GAUGE Right 12/27/2021   Procedure: PARS PLANA VITRECTOMY 27 GAUGE;  Surgeon: Carmela Rima, MD;  Location: [facility] OR;  Service: Ophthalmology;  Laterality: Right;   PHOTOCOAGULATION WITH LASER Right 04/28/2021   Procedure: PHOTOCOAGULATION WITH LASER;  Surgeon: Carmela Rima, MD;  Location: [facility] OR;  Service: Ophthalmology;  Laterality: Right;  Right eye   POLYPECTOMY  09/29/2021   Procedure: POLYPECTOMY;  Surgeon: Benancio Deeds, MD;  Location: [facility];  Service: Gastroenterology;;   RIGHT/LEFT HEART CATH AND CORONARY/GRAFT ANGIOGRAPHY N/A 05/23/2022   Procedure: RIGHT/LEFT HEART CATH AND CORONARY/GRAFT ANGIOGRAPHY;  Surgeon: Kathleene Hazel, MD;  Location: [facility]VAS[facility] CV LAB;  Service: Cardiovascular;  Laterality: N/A;   TEE WITHOUT CARDIOVERSION N/A 11/04/2018   Procedure: TRANSESOPHAGEAL ECHOCARDIOGRAM (TEE);  Surgeon: Linden Dolin, MD;  Location: [facility] OR;  Service: Open Heart Surgery;  Laterality: N/A;    reports that she has quit smoking. Her smoking use included cigarettes. She has never used smokeless tobacco. She reports that she does not currently use alcohol. She reports current drug use. Drug: Marijuana. family history includes Anxiety disorder in her father; Asthma in her father; Cancer in her mother; Colon polyps in her father; Congestive Heart Failure in her father; Coronary artery disease in her father; Depression in her maternal grandmother; Diabetes in her paternal grandmother; Hypercalcemia in her father and mother; Hypertension in her father and mother; OCD in her father. Allergies  Allergen Reactions   Crestor [Rosuvastatin Calcium] Other (See Comments)    Myalgias  Arthralgias    Lipitor [Atorvastatin] Other (See Comments)    Myalgias  Monosodium Glutamate Nausea And Vomiting and Other (See Comments)    MSG - migraine   Zetia [Ezetimibe] Other (See Comments)    Myalgias    Ciprofloxin Hcl [Ciprofloxacin] Nausea And Vomiting and Other (See Comments)    Severe migraine   Food Color Red [Red Dye #40 (Allura Red)] Diarrhea      Outpatient Encounter Medications as of 09/20/2022  Medication Sig   acetaminophen (TYLENOL) 500 MG tablet Take 1,500 mg by mouth as needed for moderate pain.   albuterol (VENTOLIN HFA) 108 (90 Base) MCG/ACT inhaler INHALE 2 PUFFS INTO THE LUNGS EVERY 6 (SIX) HOURS AS NEEDED FOR WHEEZING OR SHORTNESS OF BREATH. (Patient taking differently: Inhale 2  puffs into the lungs daily as needed for wheezing or shortness of breath.)   Alirocumab (PRALUENT) 150 MG/ML SOAJ Inject 1 pen  into the skin every 14 (fourteen) days.   apixaban (ELIQUIS) 5 MG TABS tablet Take 1 tablet (5 mg total) by mouth 2 (two) times daily.   aspirin 81 MG EC tablet Take 1 tablet (81 mg total) by mouth daily. (Patient taking differently: Take 81 mg by mouth at bedtime.)   buPROPion (WELLBUTRIN XL) 150 MG 24 hr tablet Take 1 tablet (150 mg total) by mouth every morning.   Continuous Glucose Sensor (DEXCOM G6 SENSOR) MISC Use as instructed to check glucose levels. Change every 10 days.   Continuous Glucose Transmitter (DEXCOM G6 TRANSMITTER) MISC Use as instructed to check glucose levels. Change every 90 days.   digoxin (LANOXIN) 0.125 MG tablet Take 1 tablet (0.125 mg total) by mouth daily.   diphenhydrAMINE (BENADRYL) 25 MG tablet Take 37.5 mg by mouth at bedtime.   DULoxetine (CYMBALTA) 30 MG capsule Take 1 capsule (30 mg total) by mouth daily.   fluticasone (FLONASE) 50 MCG/ACT nasal spray Insert 1 spray each nostril daily for 3 days then use 2-3 times a week as needed (Patient taking differently: Place 2 sprays into both nostrils daily. When able to remember)   Glucagon 3 MG/DOSE POWD Place 3 mg into the nose once as needed for up to 1 dose.   Insulin Disposable Pump (OMNIPOD 5 G6 PODS, GEN 5,) MISC Use every 3 (three) days.   insulin lispro (HUMALOG) 100 UNIT/ML injection Inject 0.35 mLs (35 Units total) into the skin daily via insulin pump   levothyroxine (SYNTHROID) 100 MCG tablet Take 1 tablet (100 mcg total) by mouth daily before breakfast.   metoCLOPramide (REGLAN) 5 MG tablet Take 1 tablet (5 mg total) by mouth every 8 (eight) hours as needed for nausea or vomiting. (Patient taking differently: Take 2.5-5 mg by mouth as needed for nausea or vomiting.)   mometasone-formoterol (DULERA) 100-5 MCG/ACT AERO Inhale 2 puffs into the lungs 2 (two) times daily.   montelukast  (SINGULAIR) 10 MG tablet Take 1 tablet (10 mg total) by mouth at bedtime.   Multiple Vitamin (MULTIVITAMIN WITH MINERALS) TABS tablet Take 1 tablet by mouth daily with lunch.   nitroGLYCERIN (NITROSTAT) 0.4 MG SL tablet Place 1 tablet (0.4 mg total) under the tongue every 5 (five) minutes as needed for chest pain.   ondansetron (ZOFRAN) 4 MG tablet Take 1 tablet (4 mg total) by mouth daily as needed for nausea or vomiting.   OVER THE COUNTER MEDICATION Take 3 tablets by mouth in the morning. Super Lysine   pantoprazole (PROTONIX) 40 MG tablet Take 1 tablet (40 mg total) by mouth 2 (two) times daily.   [DISCONTINUED] hydrOXYzine (ATARAX) 25 MG tablet  Take 25 mg by mouth as needed for anxiety. (Patient not taking: Reported on 09/13/2022)   No facility-administered encounter medications on file as of 09/20/2022.     REVIEW OF SYSTEMS  : All other systems reviewed and negative except where noted in the History of Present Illness.   PHYSICAL EXAM: BP (!) 92/56   Pulse 88   Ht 5\' 5"  (1.651 m)   Wt 137 lb 12.8 oz (62.5 kg)   LMP 06/06/2022   BMI 22.93 kg/m  General: Well developed white female in no acute distress Head: Normocephalic and atraumatic Eyes:  sclerae anicteric,conjunctive pink. Ears: Normal auditory acuity Neck: Supple, no masses.  Lungs: Clear throughout to auscultation Heart: Regular rate and rhythm Abdomen: Soft, nontender, non distended. No masses or hepatomegaly noted. Normal bowel sounds Rectal: Musculoskeletal: Symmetrical with no gross deformities  Skin: No lesions on visible extremities Extremities: No edema  Neurological: Alert oriented x 4, grossly non-focal Psychological:  Alert and cooperative. Normal mood and affect  ASSESSMENT AND PLAN:    CC:  [name], NP

## 2022-09-20 NOTE — Progress Notes (Signed)
[name], female   DOB: [birth year], 52 y.o.   MRN: [id number]    Jennifer Hogan, is a 52 y.o. female  WGN:[id number]  HQI:[id number]  DOB - 02/22/1970  Chief Complaint  Patient presents with   Hospitalization Follow-up       Subjective:   Jennifer Hogan is a 52 y.o. female here today for a follow up visit after hospitalization 7/28-08/30/2022.  She is feeling much better.  Had GI appt this morning.  Has cardiology f/up in 2 weeks.  Not having any CP/SOB. Does not need RF.  She does want to get flu shot today.  She was cathed at ED and is having some slight burning with urination at times.  She was also treated for UTI during hospitalization.    Had shingles vaccine in April and had fever/Nausea/body aches and had to be in bed.  She wants to wait before getting next dose.     From discharge summary Discharge Diagnoses:  Principal Problem: Elevated troponin Nausea vomiting UTI Chronic systolic CHF CABG Type 1 diabetes mellitus Chronic anemia Anxiety Hypothyroidism Left IJ DVT   AKI (acute kidney injury) (HCC)   Elevated troponin   Nausea  History of present illness:  51/F with history of chronic systolic CHF, ischemic cardiomyopathy on home dobutamine, CAD/CABG, type 1 diabetes mellitus, chronic anemia, anxiety, hypothyroidism, left IJ DVT on Eliquis presented to [facility], ED on 8/13 with generalized weakness, constipation and nausea, in the ED blood glucose was 167, CO2 21, beta hydroxybutyrate acid mildly elevated at 1.48, anion gap was normal, troponin trended up to 1216, she was started on a heparin infusion and admitted to [facility], ICU, cards consulted and following -Transferred from PCCM to [facility] service 8/15   Hospital Course:    NSTEMI versus demand ischemia -Troponin peaked at 1484 -Echo noted EF of 50-55% with grade 2 diastolic dysfunction and hypokinesis of inferior and septal wall -Followed by cardiology this admission, no further  workup felt to be indicated at this time -Last cath 4/24 with significant multivessel disease, medical management was recommended then -Continue aspirin, allergic to statins, not on beta-blocker, previously on dobutamine which was discontinued this admission, follow-up with cards, consider low-dose beta-blocker down the road   Nausea, vomiting, chills Low-grade fevers ? UTI -Blood cultures are negative -Flu and COVID PCR were negative -Abdominal exam is benign, CT abdomen pelvis on admission was unremarkable, UA benign on admission -Improving, advance diet -Then urinary symptoms 2 days ago repeat UA with increased WBCs, rare bacteria only, started on ceftriaxone given possible UTI symptoms, urine cultures pending, transition to oral cefdinir at discharge -Also removed tunneled central line -Symptoms have resolved   Chronic systolic CHF, ischemic cardiomyopathy -Previously felt to be end-stage on dobutamine 3 mcg, EF was 20-25% in April, EF recovered to 45-50% on last echo 08/17/2022 -Cards following, dobutamine dose was decreased to 1.5 mcg on account of persistent tachycardia -Discussed with heart failure team 8/15, Dr.Bensimhon recommended to discontinued Dobutamine, as she was not felt to need this any longer -Central line removed 8/17 -Continue digoxin -She is not on diuretics at baseline -Follow-up heart failure clinic   Type 1 diabetes mellitus Hyperglycemia -Pump was malfunctioning, now fixed -Consult diabetes coordinator appreciated   Mild AKI -Resolved with hydration   History of DVT -Eliquis resumed No problems updated.  ALLERGIES: Allergies  Allergen Reactions   Crestor [Rosuvastatin Calcium] Other (See Comments)    Myalgias  Arthralgias    Lipitor [Atorvastatin] Other (  See Comments)    Myalgias    Monosodium Glutamate Nausea And Vomiting and Other (See Comments)    MSG - migraine   Zetia [Ezetimibe] Other (See Comments)    Myalgias    Ciprofloxin Hcl  [Ciprofloxacin] Nausea And Vomiting and Other (See Comments)    Severe migraine   Food Color Red [Red Dye #40 (Allura Red)] Diarrhea    PAST MEDICAL HISTORY: Past Medical History:  Diagnosis Date   Allergy    Anemia    Anxiety    Asthma    CAD S/P percutaneous coronary angioplasty 03/28/2017   Cataract    CHF (congestive heart failure) (HCC)    Coronary artery disease    a. s/p PTCA DES x3 in the LAD (ostial DES placed, mid DES placed, distal DES placed) and PTCA/DES x1 to intermediate branch 02/2017. b. ACS 02/08/18 with LCx stent placement.   Depression    Diabetes mellitus    type 1   DKA (diabetic ketoacidoses) 12/31/2017   DKA (diabetic ketoacidosis) (HCC) 01/13/2022   Dyspnea    with exertion and dust, no oxygen   Elevated platelet count    GERD (gastroesophageal reflux disease)    Heart murmur    never caused any problems   High cholesterol    Hypothyroidism    Ischemic cardiomyopathy    a. EF low-normal with basal inferior akinesis, basal septal hypokinesis by echo 01/2018.   Myocardial infarction [facility]) 2019   Nausea and vomiting 12/04/2010   Non-ST elevation (NSTEMI) myocardial infarction [facility]) 02/08/2018   NSTEMI (non-ST elevated myocardial infarction) (HCC)    Substance abuse (HCC)    marijuana    MEDICATIONS AT HOME: Prior to Admission medications   Medication Sig Start Date End Date Taking? Authorizing Provider  acetaminophen (TYLENOL) 500 MG tablet Take 1,500 mg by mouth as needed for moderate pain.   Yes [provider]  albuterol (VENTOLIN HFA) 108 (90 Base) MCG/ACT inhaler INHALE 2 PUFFS INTO THE LUNGS EVERY 6 (SIX) HOURS AS NEEDED FOR WHEEZING OR SHORTNESS OF BREATH. Patient taking differently: Inhale 2 puffs into the lungs daily as needed for wheezing or shortness of breath. 10/25/21  Yes Newlin, Enobong, MD  Alirocumab (PRALUENT) 150 MG/ML SOAJ Inject 1 pen  into the skin every 14 (fourteen) days. 09/23/21  Yes Turner, Cornelious Bryant, MD  apixaban  (ELIQUIS) 5 MG TABS tablet Take 1 tablet (5 mg total) by mouth 2 (two) times daily. 06/27/22  Yes Bensimhon, Bevelyn Buckles, MD  aspirin 81 MG EC tablet Take 1 tablet (81 mg total) by mouth daily. Patient taking differently: Take 81 mg by mouth at bedtime. 12/07/17  Yes Amin, Ankit C, MD  buPROPion (WELLBUTRIN XL) 150 MG 24 hr tablet Take 1 tablet (150 mg total) by mouth every morning. 09/13/22  Yes Bahraini, Sarah A  Continuous Glucose Sensor (DEXCOM G6 SENSOR) MISC Use as instructed to check glucose levels. Change every 10 days. 08/28/22  Yes Carlus Pavlov, MD  Continuous Glucose Transmitter (DEXCOM G6 TRANSMITTER) MISC Use as instructed to check glucose levels. Change every 90 days. 05/29/22  Yes Carlus Pavlov, MD  digoxin (LANOXIN) 0.125 MG tablet Take 1 tablet (0.125 mg total) by mouth daily. 05/31/22  Yes Clegg, Amy D, NP  diphenhydrAMINE (BENADRYL) 25 MG tablet Take 37.5 mg by mouth at bedtime.   Yes [provider]  DULoxetine (CYMBALTA) 30 MG capsule Take 1 capsule (30 mg total) by mouth daily. 09/13/22  Yes Bahraini, Sarah A  fluticasone (FLONASE) 50  MCG/ACT nasal spray Insert 1 spray each nostril daily for 3 days then use 2-3 times a week as needed Patient taking differently: Place 2 sprays into both nostrils daily. When able to remember 05/16/22  Yes [name], NP  Glucagon 3 MG/DOSE POWD Place 3 mg into the nose once as needed for up to 1 dose. 09/13/22  Yes Carlus Pavlov, MD  Insulin Disposable Pump (OMNIPOD 5 G6 PODS, GEN 5,) MISC Use every 3 (three) days. 05/03/22  Yes Carlus Pavlov, MD  insulin lispro (HUMALOG) 100 UNIT/ML injection Inject 0.35 mLs (35 Units total) into the skin daily via insulin pump 09/13/22  Yes Carlus Pavlov, MD  levothyroxine (SYNTHROID) 100 MCG tablet Take 1 tablet (100 mcg total) by mouth daily before breakfast. 08/23/22  Yes [name], NP  metoCLOPramide (REGLAN) 5 MG tablet Take 1 tablet (5 mg total) by mouth every 8 (eight) hours as  needed for nausea or vomiting. Patient taking differently: Take 2.5-5 mg by mouth as needed for nausea or vomiting. 12/23/21 12/23/22 Yes Armbruster, Willaim Rayas, MD  mometasone-formoterol (DULERA) 100-5 MCG/ACT AERO Inhale 2 puffs into the lungs 2 (two) times daily. 02/15/22  Yes [name], NP  montelukast (SINGULAIR) 10 MG tablet Take 1 tablet (10 mg total) by mouth at bedtime. 08/23/22  Yes [name], NP  Multiple Vitamin (MULTIVITAMIN WITH MINERALS) TABS tablet Take 1 tablet by mouth daily with lunch.   Yes [provider]  nitroGLYCERIN (NITROSTAT) 0.4 MG SL tablet Place 1 tablet (0.4 mg total) under the tongue every 5 (five) minutes as needed for chest pain. 06/08/22  Yes Bensimhon, Bevelyn Buckles, MD  ondansetron (ZOFRAN) 4 MG tablet Take 1 tablet (4 mg total) by mouth daily as needed for nausea or vomiting. 03/24/22 03/24/23 Yes Hollice Espy, MD  OVER THE COUNTER MEDICATION Take 3 tablets by mouth in the morning. Super Lysine   Yes [provider]  pantoprazole (PROTONIX) 40 MG tablet Take 1 tablet (40 mg total) by mouth 2 (two) times daily. 08/23/22  Yes [name], NP    ROS: Neg HEENT Neg resp Neg cardiac Neg GI Neg GU Neg MS Neg psych Neg neuro  Objective:   Vitals:   09/20/22 1003  BP: 113/73  Pulse: 78  SpO2: 100%  Weight: 138 lb 6.4 oz (62.8 kg)   Exam General appearance : Awake, alert, not in any distress. Speech Clear. Not toxic looking HEENT: Atraumatic and Normocephalic Neck: Supple, no JVD. No cervical lymphadenopathy.  Chest: Good air entry bilaterally, CTAB.  No rales/rhonchi/wheezing CVS: S1 S2 regular, no murmurs.  Extremities: B/L Lower Ext shows no edema, both legs are warm to touch Neurology: Awake alert, and oriented X 3, CN II-XII intact, Non focal Skin: No Rash  Data Review Lab Results  Component Value Date   HGBA1C 7.2 (A) 08/23/2022   HGBA1C 7.8 (H) 05/24/2022   HGBA1C 8.0 (H) 03/24/2022    Assessment & Plan    1. UTI (urinary tract infection), uncomplicated Was treated and resolved.  - POCT URINALYSIS DIP (CLINITEK)  2. Type 1 diabetes mellitus with other circulatory complication (HCC) Followed by endocrine-A1C=7.2 1 month ago and blood sugar on CGM=190 this morning  3. Chronic systolic heart failure (HCC) Followed by cardiology-continue current regimen  4. Hospital discharge follow-up Doing well  Flu shot given   Return for November for appt with Zelda.  The patient was given clear instructions to go to ER or return to medical  center if symptoms don't improve, worsen or new problems develop. The patient verbalized understanding. The patient was told to call to get lab results if they haven't heard anything in the next week.      Georgian Co, PA-C [facility] [place], [place] [phone]   09/20/2022, 10:41 AM

## 2022-09-21 ENCOUNTER — Encounter (HOSPITAL_COMMUNITY): Payer: Medicare Other

## 2022-09-21 ENCOUNTER — Encounter: Payer: Self-pay | Admitting: Gastroenterology

## 2022-09-21 DIAGNOSIS — K5909 Other constipation: Secondary | ICD-10-CM | POA: Insufficient documentation

## 2022-09-21 NOTE — Progress Notes (Signed)
Agree with assessment and plan as outlined.  Suspect multifactorial with suspected underlying gastroparesis and marijuana use.  She needs to completely abstain from marijuana given the symptoms.  She should use Reglan liberally if she feels like this helps her, she had mentioned to me in the past this does provide benefit.  If she is using Reglan and it is not working, we will need to consider other options such as domperidone if she is a candidate for it.  I will see her in a few months in the office for reassessment.

## 2022-10-06 ENCOUNTER — Ambulatory Visit (HOSPITAL_COMMUNITY)
Admission: RE | Admit: 2022-10-06 | Discharge: 2022-10-06 | Disposition: A | Discharge status: Home / Self Care | Payer: Medicare Other | Source: Ambulatory Visit | Attending: Internal Medicine | Admitting: Internal Medicine

## 2022-10-06 ENCOUNTER — Encounter (HOSPITAL_COMMUNITY): Payer: Self-pay | Admitting: Internal Medicine

## 2022-10-06 ENCOUNTER — Other Ambulatory Visit: Payer: Self-pay

## 2022-10-06 ENCOUNTER — Other Ambulatory Visit (HOSPITAL_COMMUNITY): Payer: Self-pay | Admitting: *Deleted

## 2022-10-06 VITALS — BP 121/70 | HR 87 | Wt 141.4 lb

## 2022-10-06 DIAGNOSIS — I255 Ischemic cardiomyopathy: Secondary | ICD-10-CM

## 2022-10-06 DIAGNOSIS — I252 Old myocardial infarction: Secondary | ICD-10-CM | POA: Diagnosis not present

## 2022-10-06 DIAGNOSIS — I5022 Chronic systolic (congestive) heart failure: Secondary | ICD-10-CM

## 2022-10-06 DIAGNOSIS — Z7901 Long term (current) use of anticoagulants: Secondary | ICD-10-CM | POA: Insufficient documentation

## 2022-10-06 DIAGNOSIS — Z951 Presence of aortocoronary bypass graft: Secondary | ICD-10-CM | POA: Insufficient documentation

## 2022-10-06 DIAGNOSIS — E109 Type 1 diabetes mellitus without complications: Secondary | ICD-10-CM | POA: Insufficient documentation

## 2022-10-06 DIAGNOSIS — I420 Dilated cardiomyopathy: Secondary | ICD-10-CM | POA: Diagnosis not present

## 2022-10-06 DIAGNOSIS — I5023 Acute on chronic systolic (congestive) heart failure: Secondary | ICD-10-CM | POA: Diagnosis present

## 2022-10-06 DIAGNOSIS — I2510 Atherosclerotic heart disease of native coronary artery without angina pectoris: Secondary | ICD-10-CM | POA: Diagnosis not present

## 2022-10-06 DIAGNOSIS — E108 Type 1 diabetes mellitus with unspecified complications: Secondary | ICD-10-CM

## 2022-10-06 LAB — BASIC METABOLIC PANEL
Anion gap: 11 (ref 5–15)
BUN: 11 mg/dL (ref 6–20)
CO2: 25 mmol/L (ref 22–32)
Calcium: 9.5 mg/dL (ref 8.9–10.3)
Chloride: 103 mmol/L (ref 98–111)
Creatinine, Ser: 1.07 mg/dL — ABNORMAL HIGH (ref 0.44–1.00)
GFR, Estimated: >60 mL/min (ref >60)
Glucose, Bld: 100 mg/dL — ABNORMAL HIGH (ref 70–99)
Potassium: 4.9 mmol/L (ref 3.5–5.1)
Sodium: 139 mmol/L (ref 135–145)

## 2022-10-06 LAB — DIGOXIN LEVEL: Digoxin Level: 0.6 ng/mL — ABNORMAL LOW (ref 0.8–2.0)

## 2022-10-06 LAB — BRAIN NATRIURETIC PEPTIDE: B Natriuretic Peptide: 71.2 pg/mL (ref 0.0–100.0)

## 2022-10-06 MED ORDER — SPIRONOLACTONE 25 MG PO TABS
12.5000 mg | ORAL_TABLET | Freq: Every day | ORAL | 3 refills | Status: DC
Start: 2022-10-06 — End: 2022-11-09
  Filled 2022-10-06: qty 45, 90d supply, fill #0

## 2022-10-06 MED ORDER — LOSARTAN POTASSIUM 25 MG PO TABS
12.5000 mg | ORAL_TABLET | Freq: Every day | ORAL | 3 refills | Status: DC
Start: 2022-10-06 — End: 2022-11-09
  Filled 2022-10-06: qty 45, 90d supply, fill #0

## 2022-10-06 NOTE — Progress Notes (Signed)
Patient presents for 2 month f/u in [facility] today with her caregivers Olegario Messier and Zollie Beckers.   Pt walked into clinic today. States she is "feeling the best I have felt in 4 years" since recent hospitalization.  She is able to perform her ADLs, feed her horse, and take care of her other animals with minimal difficulty. Denies lightheadedness, dizziness, falls, and shortness of breath. Reports occasional "swimmy eyes". She can not correlate any physical symptoms that could cause this. She has reached out to her retina specialist for recommendations.  During recent hospitalization echo showed EF 50-55%. Decision was made at that time to stop Dobutamine on 8/15 and PICC was removed at that time. Will plan to transition pt back to Heart Failure clinic as she is not a V[facility] candidate at this time. Will plan for repeat echo in 3 months with follow up appt per Dr Gala Romney. Echo order placed today. Messaged Jasmine/Philicia CMA regarding need for PA.   Per Dr Gala Romney will restart Losartan 12.5 mg daily and Spironolactone 12.5 mg daily (both to be taken in the evening). BMET, BNP, and Digoxin level obtained today. Will repeat BMET in 2 weeks to assess tolerance of medication restarts.   Pt requesting new referral for cardiac rehab with recent hospitalization. Referral sent for cardiac rehab.   Vital Signs:  HR: 87 SR BP: 121/70 (86) SPO2: 98%   Weight: 141.4 lb w/o eqt Last weight: 142.2 lb  Symptom YES NO DETAILS  Angina  x Activity:  Claudication  x How Far:  Syncope  x When:  Stroke  x   Orthopnea  x How many pillows:  PND  x How often:  CPAP  x How many hours:  Pedal Edema  x   Abdominal Fullness  x   Nausea / Vomit  x   Diaphoresis  x When:  Shortness of Breath  x Activity:  Palpitations  x When:   ICD shock     Bleeding S/S  x   Tea-colored Urine  x   Hospitalizations x  8/13-8/19- DKA, Abd pain, N/V, NSTEMI vs demand ischemia  Emergency Room  x   Other MD  x   Activity Performing  all ADLs independently   Fluid   Diet Good appetite     Patient Instructions:  Start Losartan 12.5 mg (1/2 tablet) daily (every evening) Start Spironolactone 12.5 mg (1/2 tablet) daily (every evening) Return to Heart Failure clinic in 2 weeks for BMET Return to Heart Failure clinic in 3 months for follow up with Dr Gala Romney with an echo. We will call you with appt info  Referral was sent for cardiac rehab  Alyce Pagan RN VAD Coordinator  Office: [phone]  24/7 Pager: [phone]

## 2022-10-06 NOTE — Progress Notes (Signed)
Advanced Heart Failure Clinic Note    PCP: [name], NP PCP-Cardiologist: Armanda Magic, MD  HF Cardiologist: Dr. Gala Romney  HPI: Jennifer Hogan is a 52 y.o.with HFrEF, CAD, status post coronary artery bypass and graft (LIMA to the LAD/ramus intermedius and RIMA to the obtuse marginal), DM Type I,  anemia, anxiety, hypothyroidism, and hyperlipidemia.    Admitted 09/2020 with DKA and NSTEMI.  Hospital course complicated by acute respiratory failure and pulmonary edema. Had ECHO with reduced EF down to 20-25%. LHC showed severe CAD with total occlusion of the proximal LAD, moderate stenosis of the intermediate branch, and severe ostial circumflex in-stent restenosis. Diuresed with IV lasix. Discharged on Toprol Xl and spironolactone. Discharged to home on 10/06/2020.    Admitted 4/23 with NSTEMI and lactic acidosis. Echo EF 30-35% Cath with stable anatomy.  No culprit.  Severe 2 vessel obstructive CAD (LAD CTO, Ost LCx 99%, RCA distal 60%) Patent sequential LIMA graft  to the ramus intermediate and distal LAD Patent RIMA to the distal LCx Moderately elevated LVEDP  Admitted 5/23 with DKA.   Admitted 04/27-05/08/24 with DKA (in setting of malfunctioning insulin pump) and NSTEMI. HS troponin > 24,000. Hospital course c/b cardiogenic shock. L/RHC  w/ stable coronary anatomy and 2/2 patent bypass grafts, elevated PCWP, cardiac index 1.8, and PA sat 46%. Started on inotropes. Once diuresed attempted inotrope wean but quickly decompensated. Stabilized back on Dobutamine 3 mcg. Started workup for transplant with possible LVAD as backup. Tested + for Tetrahydrocannabinol and Hgb A1c 7.8. CT chest for VAD workup showed LIJ DVT. Placed on eliquis DVT starter kit. She was discharged home on dobutamine at 3 mcg/min.Marland Kitchen  Has seen Dr. Allena Katz at [facility] in 5/24 concern over DKA/gastroparesis and immunosuppression issues. Felt not to be transplant candidate.   Echo 8/24 EF 50-55% -> Weaned off DBA  Here  for f/u. Feels great. Denies CP or SOB. Using Omni-Pod. HgBA1c < 7.0. No edema, orthopnea or PND.   Cardiac Testing  Limited echo 04/24: EF 20-25%, akinesis anteroseptal wall and apex, RV severely reduced Echo 4/23: Echo EF 30-35% Echo (12/27/20)  EF 40-45% RV mildly HK Personally reviewed Echo 09/2020 EF 20-25%  Echo 11/2018 EF 55-60%    R/LHC 04/24: Severe double vessel CAD Chronic occlusion of ostial LAD stent. The proximal, mid and distal LAD and Diagonal branches fill from the LIMA graft. The ostial Circumflex stent has severe restenosis, unchanged from last cath. Patent RIMA graft that fills the Circumflex Large dominant RCA with mild non-obstructive disease.    Cath 09/2020   Mid LAD lesion is 60% stenosed.   Ost Cx to Prox Cx lesion is 99% stenosed.   Prox Cx to Mid Cx lesion is 70% stenosed.   Ramus-1 lesion is 60% stenosed.   Ramus-2 lesion is 20% stenosed.   Ramus-3 lesion is 70% stenosed.   Prox LAD to Mid LAD lesion is 100% stenosed.   Dist LAD-1 lesion is 70% stenosed.   Dist LAD-2 lesion is 70% stenosed.   Non-stenotic Mid LAD to Dist LAD lesion was previously treated.   RIMA graft was visualized by angiography and is small.   LIMA and is large.   The graft exhibits no disease.   LV end diastolic pressure is normal.  Severe native vessel CAD with total occlusion of the proximal LAD, moderate stenosis of the intermediate branch, and severe ostial circumflex in-stent restenosis S/P CABG with patency of the both the RIMA-circumflex and LAD sequential to the intermediate  Past Medical History:  Diagnosis Date   Allergy    Anemia    Anxiety    Asthma    CAD S/P percutaneous coronary angioplasty 03/28/2017   Cataract    CHF (congestive heart failure) (HCC)    Coronary artery disease    a. s/p PTCA DES x3 in the LAD (ostial DES placed, mid DES placed, distal DES placed) and PTCA/DES x1 to intermediate branch 02/2017. b. ACS 02/08/18 with LCx stent placement.    Depression    Diabetes mellitus    type 1   DKA (diabetic ketoacidoses) 12/31/2017   DKA (diabetic ketoacidosis) (HCC) 01/13/2022   Dyspnea    with exertion and dust, no oxygen   Elevated platelet count    GERD (gastroesophageal reflux disease)    Heart murmur    never caused any problems   High cholesterol    Hypothyroidism    Ischemic cardiomyopathy    a. EF low-normal with basal inferior akinesis, basal septal hypokinesis by echo 01/2018.   Myocardial infarction [facility]) 2019   Nausea and vomiting 12/04/2010   Non-ST elevation (NSTEMI) myocardial infarction [facility]) 02/08/2018   NSTEMI (non-ST elevated myocardial infarction) (HCC)    Substance abuse (HCC)    marijuana   Current Outpatient Medications  Medication Sig Dispense Refill   albuterol (VENTOLIN HFA) 108 (90 Base) MCG/ACT inhaler INHALE 2 PUFFS INTO THE LUNGS EVERY 6 (SIX) HOURS AS NEEDED FOR WHEEZING OR SHORTNESS OF BREATH. 18 g 2   apixaban (ELIQUIS) 5 MG TABS tablet Take 1 tablet (5 mg total) by mouth 2 (two) times daily. 60 tablet 11   aspirin 81 MG EC tablet Take 1 tablet (81 mg total) by mouth daily. (Patient taking differently: Take 81 mg by mouth at bedtime.) 90 tablet 0   buPROPion (WELLBUTRIN XL) 150 MG 24 hr tablet Take 1 tablet (150 mg total) by mouth every morning. 30 tablet 2   Continuous Glucose Sensor (DEXCOM G6 SENSOR) MISC Use as instructed to check glucose levels. Change every 10 days. 9 each 1   Continuous Glucose Transmitter (DEXCOM G6 TRANSMITTER) MISC Use as instructed to check glucose levels. Change every 90 days. 1 each 1   digoxin (LANOXIN) 0.125 MG tablet Take 1 tablet (0.125 mg total) by mouth daily. 30 tablet 6   diphenhydrAMINE (BENADRYL) 25 MG tablet Take 37.5 mg by mouth at bedtime.     DULoxetine (CYMBALTA) 30 MG capsule Take 1 capsule (30 mg total) by mouth daily. 30 capsule 2   fluticasone (FLONASE) 50 MCG/ACT nasal spray Insert 1 spray each nostril daily for 3 days then use 2-3 times a week as  needed (Patient taking differently: Place 2 sprays into both nostrils daily. When able to remember) 16 g 1   Insulin Disposable Pump (OMNIPOD 5 G6 PODS, GEN 5,) MISC Use every 3 (three) days. 30 each 3   insulin lispro (HUMALOG) 100 UNIT/ML injection Inject 0.35 mLs (35 Units total) into the skin daily via insulin pump 90 mL 3   levothyroxine (SYNTHROID) 100 MCG tablet Take 1 tablet (100 mcg total) by mouth daily before breakfast. 90 tablet 1   metoCLOPramide (REGLAN) 5 MG tablet Take 1 tablet (5 mg total) by mouth every 8 (eight) hours as needed for nausea or vomiting. (Patient taking differently: Take 2.5-5 mg by mouth as needed for nausea or vomiting.) 60 tablet 3   mometasone-formoterol (DULERA) 100-5 MCG/ACT AERO Inhale 2 puffs into the lungs 2 (two) times daily. 13 g 6  montelukast (SINGULAIR) 10 MG tablet Take 1 tablet (10 mg total) by mouth at bedtime. 90 tablet 2   Multiple Vitamin (MULTIVITAMIN WITH MINERALS) TABS tablet Take 1 tablet by mouth daily with lunch.     ondansetron (ZOFRAN) 4 MG tablet Take 1 tablet (4 mg total) by mouth daily as needed for nausea or vomiting. 30 tablet 1   OVER THE COUNTER MEDICATION Take 3 tablets by mouth in the morning. Super Lysine     pantoprazole (PROTONIX) 40 MG tablet Take 1 tablet (40 mg total) by mouth 2 (two) times daily. 60 tablet 2   acetaminophen (TYLENOL) 500 MG tablet Take 1,500 mg by mouth as needed for moderate pain.     Alirocumab (PRALUENT) 150 MG/ML SOAJ Inject 1 pen  into the skin every 14 (fourteen) days. (Patient not taking: Reported on 10/06/2022) 2 mL 11   Glucagon 3 MG/DOSE POWD Place 3 mg into the nose once as needed for up to 1 dose. (Patient not taking: Reported on 10/06/2022) 1 each 11   nitroGLYCERIN (NITROSTAT) 0.4 MG SL tablet Place 1 tablet (0.4 mg total) under the tongue every 5 (five) minutes as needed for chest pain. (Patient not taking: Reported on 10/06/2022) 25 tablet 6   No current facility-administered medications for  this encounter.   Allergies  Allergen Reactions   Crestor [Rosuvastatin Calcium] Other (See Comments)    Myalgias  Arthralgias    Lipitor [Atorvastatin] Other (See Comments)    Myalgias    Monosodium Glutamate Nausea And Vomiting and Other (See Comments)    MSG - migraine   Zetia [Ezetimibe] Other (See Comments)    Myalgias    Ciprofloxin Hcl [Ciprofloxacin] Nausea And Vomiting and Other (See Comments)    Severe migraine   Food Color Red [Red Dye #40 (Allura Red)] Diarrhea   Social History   Socioeconomic History   Marital status: Divorced    Spouse name: Not on file   Number of children: 0   Years of education: Not on file   Highest education level: Doctorate  Occupational History   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 3 times weekly   Sexual activity: Not Currently    Birth control/protection: None  Other Topics Concern   Not on file  Social History Narrative   Not on file   Social Determinants of Health   Financial Resource Strain: High Risk (05/15/2022)   Overall Financial Resource Strain (CARDIA)    Difficulty of Paying Living Expenses: Very hard  Food Insecurity: No Food Insecurity (05/26/2022)   Hunger Vital Sign    Worried About Running Out of Food in the Last Year: Never true    Ran Out of Food in the Last Year: Never true  Recent Concern: Food Insecurity - Food Insecurity Present (05/15/2022)   Hunger Vital Sign    Worried About Running Out of Food in the Last Year: Often true    Ran Out of Food in the Last Year: Often  true  Transportation  Needs: No Transportation Needs (05/26/2022)   PRAPARE - Administrator, Civil Service (Medical): No    Lack of Transportation (Non-Medical): No  Recent Concern: Transportation Needs - Unmet Transportation Needs (05/15/2022)   PRAPARE - Transportation    Lack of Transportation (Medical): Yes    Lack of Transportation (Non-Medical): Yes  Physical Activity: Unknown (05/15/2022)   Exercise Vital Sign    Days of Exercise per Week: 0 days    Minutes of Exercise per Session: Not on file  Stress: Stress Concern Present (05/15/2022)   [employer] of Occupational Health - Occupational Stress Questionnaire    Feeling of Stress : Very much  Social Connections: Socially Isolated (05/15/2022)   Social Connection and Isolation Panel [NHANES]    Frequency of Communication with Friends and Family: More than three times a week    Frequency of Social Gatherings with Friends and Family: Once a week    Attends Religious Services: Never    Database administrator or Organizations: No    Attends Engineer, structural: Not on file    Marital Status: Divorced  Intimate Partner Violence: Not At Risk (05/26/2022)   Humiliation, Afraid, Rape, and Kick questionnaire    Fear of Current or Ex-Partner: No    Emotionally Abused: No    Physically Abused: No    Sexually Abused: No   Family History  Problem Relation Age of Onset   Cancer Mother        T cell lymphoma   Hypertension Mother    Hypercalcemia Mother    OCD Father    Anxiety disorder Father    Coronary artery disease Father    Congestive Heart Failure Father    Asthma Father    Hypercalcemia Father    Hypertension Father    Colon polyps Father    Depression Maternal Grandmother    Diabetes Paternal Grandmother    Stomach cancer Neg Hx    Esophageal cancer Neg Hx    Colon cancer Neg Hx    Rectal cancer Neg Hx    BP 121/70 Comment: map 86  Pulse 87   Wt 64.1 kg (141 lb 6.4 oz)   LMP 06/06/2022   SpO2 98%   BMI 23.53  kg/m   Wt Readings from Last 3 Encounters:  10/06/22 64.1 kg (141 lb 6.4 oz)  09/20/22 62.8 kg (138 lb 6.4 oz)  09/20/22 62.5 kg (137 lb 12.8 oz)   PHYSICAL EXAM: General:  Well appearing. No resp difficulty HEENT: normal Neck: supple. no JVD. Carotids 2+ bilat; no bruits. No lymphadenopathy or thryomegaly appreciated. Cor: PMI nondisplaced. Regular rate & rhythm. No rubs, gallops or murmurs. Lungs: clear Abdomen: soft, nontender, nondistended. No hepatosplenomegaly. No bruits or masses. Good bowel sounds. Extremities: no cyanosis, clubbing, rash, edema Neuro: alert & orientedx3, cranial nerves grossly intact. moves all 4 extremities w/o difficulty. Affect pleasant   ASSESSMENT & PLAN:  1. 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  - Echo 09/2020 EF 20-25%.  - LHC with 09/2020 with severe coronary disease. Total occlusion of the proximal LAD, moderate stenosis of the intermediate branch, and severe ostial circumflex in-stent restenosis.  - Echo 12/27/20  EF 40-45% RV mildly HK - Echo 12/23 EF 45-50%  - Echo 04/24: EF 20-25% w/ AK of anteroseptal wall and apex, RV severely reduced. Also w/ Hs trop elevation peaking at 5,048. LHC w/ stable coronary  disease and 2/2 patent bypass grafts. Suspect drop in EF likely stress induced in setting of critical illness/DKA - RHC c/w low output, LV dominant shock (RA 4, PAP 44/18, PCWP 24, LVEDP 24, FICK CO 3.04, CI 1.85, Co-ox 46%).  - Stabilized on Dobutamine 3 mcg.  - Echo 8/24 EF 50-55% - Weaned off DBA - NYHA I-II. Volume status stable.  - Continue digoxin 0.125 daily.  - No BB with recent shock. - Restart spiro 12.5 qhs - Restart losartan 12.5 qhs - not candidate for SGLT2i w/ Type 1DM and DKA  - RTC in 3 months with echo    2. CAD/recent NSTEMI  - h/o CABG x 2 2020, LIMA-LAD, RIMA-LCx - LHC 04/24 w/ stable disease, 2/2 patent grafts. No culprit lesions  - No s/s angina -  continue medical management  - Does not tolerate statins. Has praluent at home but had not used recently.   3. Type I DM - Hgb A1C  < 7.0  Jennifer Meres, MD  12:57 PM

## 2022-10-06 NOTE — Patient Instructions (Addendum)
Start Losartan 12.5 mg (1/2 tablet) daily (every evening) Start Spironolactone 12.5 mg (1/2 tablet) daily (every evening) Return to Heart Failure clinic in 2 weeks for BMET Return to Heart Failure clinic in 3 months for follow up with Dr Gala Romney with an echo. We will call you with appt info  Referral was sent for cardiac rehab

## 2022-10-09 ENCOUNTER — Encounter (HOSPITAL_COMMUNITY): Payer: Self-pay

## 2022-10-09 ENCOUNTER — Telehealth (HOSPITAL_COMMUNITY): Payer: Self-pay

## 2022-10-09 NOTE — Telephone Encounter (Signed)
Attempted to call patient in regards to Pulmonary Rehab - LM on VM Mailed letter

## 2022-10-10 ENCOUNTER — Other Ambulatory Visit: Payer: Self-pay

## 2022-10-11 ENCOUNTER — Other Ambulatory Visit (HOSPITAL_COMMUNITY): Payer: Self-pay | Admitting: *Deleted

## 2022-10-11 DIAGNOSIS — I255 Ischemic cardiomyopathy: Secondary | ICD-10-CM

## 2022-10-11 DIAGNOSIS — I5022 Chronic systolic (congestive) heart failure: Secondary | ICD-10-CM

## 2022-10-20 ENCOUNTER — Encounter: Payer: Self-pay | Admitting: *Deleted

## 2022-10-20 ENCOUNTER — Encounter (HOSPITAL_COMMUNITY): Payer: Self-pay | Admitting: *Deleted

## 2022-10-20 ENCOUNTER — Other Ambulatory Visit (HOSPITAL_COMMUNITY): Payer: Medicare Other

## 2022-10-24 ENCOUNTER — Encounter (HOSPITAL_COMMUNITY): Payer: Self-pay

## 2022-10-24 ENCOUNTER — Other Ambulatory Visit (HOSPITAL_COMMUNITY): Payer: Medicare Other

## 2022-10-30 ENCOUNTER — Other Ambulatory Visit (HOSPITAL_COMMUNITY): Payer: Self-pay

## 2022-10-31 ENCOUNTER — Telehealth: Payer: Self-pay

## 2022-10-31 NOTE — Telephone Encounter (Signed)
Copied from CRM [id number]. Topic: General - Other >> Oct 31, 2022 12:08 PM Santiya F wrote: Reason for CRM: Geoffery Spruce with [employer] is calling in because pt passed away and she wants to know if Bertram Denver will sign the death certificate. Leann says she doesn't know the time of death but can get that information if it's needed. Geoffery Spruce says the pt died at home and she is doing a trade call from the funeral home the family chose in [place]. Leann can be reached at [phone]

## 2022-11-01 ENCOUNTER — Telehealth: Payer: Self-pay | Admitting: Nurse Practitioner

## 2022-11-01 NOTE — Telephone Encounter (Signed)
Provider is aware and will fill out information needed.

## 2022-11-01 NOTE — Telephone Encounter (Signed)
Copied from CRM [id number]. Topic: General - Inquiry >> Nov 01, 2022 11:12 AM Marlow Baars wrote: Reason for CRM: Angela Nevin a close personal friend of the patient called in stating he would like to speak with the provider when she gets the chance

## 2022-11-02 ENCOUNTER — Telehealth: Payer: Self-pay

## 2022-11-02 ENCOUNTER — Telehealth: Payer: Self-pay | Admitting: Nurse Practitioner

## 2022-11-02 NOTE — Telephone Encounter (Signed)
Copied from CRM [id number]. Topic: General - Inquiry >> Nov 02, 2022  9:49 AM Haroldine Laws wrote: Reason for CRM: Thereasa Distance with [employer] called saying they need a sign off on her death certificate so they can have her cremated.  CB@  [phone]

## 2022-11-02 NOTE — Telephone Encounter (Signed)
Thereasa Distance from [employer] clld stated the death certificate did not have a date on it. Please date so they can complete the paperwork.

## 2022-11-02 NOTE — Telephone Encounter (Signed)
Death Certificate has been signed

## 2022-11-06 ENCOUNTER — Telehealth: Payer: Self-pay

## 2022-11-06 NOTE — Telephone Encounter (Signed)
Received a vm from [employer]. He stated he had some information to relay.   LMTRC  J.Sheryl Towell,RMA

## 2022-11-08 ENCOUNTER — Telehealth (HOSPITAL_COMMUNITY): Payer: Self-pay | Admitting: Cardiology

## 2022-11-08 NOTE — Telephone Encounter (Signed)
Patient friend Zollie Beckers called to report pt passed away 2022/11/15  Reports pt spoke highly of Dr Gala Romney and staff with the HF clinic   Chart updated and message to provider as Lorain Childes

## 2022-11-24 ENCOUNTER — Ambulatory Visit: Payer: Medicare Other | Admitting: Nurse Practitioner

## 2022-11-24 DEATH — deceased

## 2022-11-28 ENCOUNTER — Ambulatory Visit: Payer: Medicare Other | Admitting: Nurse Practitioner

## 2022-11-29 ENCOUNTER — Telehealth (HOSPITAL_COMMUNITY): Payer: Medicare Other | Admitting: Psychiatry

## 2022-12-11 ENCOUNTER — Ambulatory Visit: Payer: Medicare Other | Admitting: Gastroenterology

## 2023-01-08 ENCOUNTER — Encounter (HOSPITAL_COMMUNITY): Payer: Medicare Other | Admitting: Internal Medicine

## 2023-01-08 ENCOUNTER — Other Ambulatory Visit (HOSPITAL_COMMUNITY): Payer: Medicare Other

## 2023-01-11 ENCOUNTER — Other Ambulatory Visit: Payer: Self-pay

## 2023-01-15 ENCOUNTER — Ambulatory Visit: Payer: Medicare Other | Admitting: Internal Medicine
